# Patient Record
Sex: Female | Born: 1955
Health system: Southern US, Community
[De-identification: ages and names within clinical notes are randomized; demographics above are authoritative.]

## PROBLEM LIST (undated history)

## (undated) DIAGNOSIS — Z9889 Other specified postprocedural states: Secondary | ICD-10-CM

## (undated) DIAGNOSIS — J9610 Chronic respiratory failure, unspecified whether with hypoxia or hypercapnia: Secondary | ICD-10-CM

## (undated) DIAGNOSIS — I219 Acute myocardial infarction, unspecified: Secondary | ICD-10-CM

## (undated) DIAGNOSIS — N183 Chronic kidney disease, stage 3 unspecified: Secondary | ICD-10-CM

## (undated) DIAGNOSIS — I639 Cerebral infarction, unspecified: Secondary | ICD-10-CM

## (undated) DIAGNOSIS — E119 Type 2 diabetes mellitus without complications: Secondary | ICD-10-CM

## (undated) DIAGNOSIS — J9811 Atelectasis: Secondary | ICD-10-CM

## (undated) DIAGNOSIS — R1013 Epigastric pain: Secondary | ICD-10-CM

## (undated) DIAGNOSIS — M4800 Spinal stenosis, site unspecified: Secondary | ICD-10-CM

## (undated) DIAGNOSIS — J189 Pneumonia, unspecified organism: Secondary | ICD-10-CM

## (undated) DIAGNOSIS — E7800 Pure hypercholesterolemia, unspecified: Secondary | ICD-10-CM

## (undated) DIAGNOSIS — I10 Essential (primary) hypertension: Secondary | ICD-10-CM

## (undated) DIAGNOSIS — N179 Acute kidney failure, unspecified: Secondary | ICD-10-CM

## (undated) DIAGNOSIS — I509 Heart failure, unspecified: Secondary | ICD-10-CM

## (undated) DIAGNOSIS — R0600 Dyspnea, unspecified: Secondary | ICD-10-CM

## (undated) HISTORY — PX: BLADDER SURGERY: SHX569

## (undated) HISTORY — DX: Atelectasis: J98.11

## (undated) HISTORY — DX: Chronic respiratory failure, unspecified whether with hypoxia or hypercapnia: J96.10

## (undated) HISTORY — DX: Acute kidney failure, unspecified: N17.9

## (undated) HISTORY — DX: Chronic kidney disease, stage 3 unspecified: N18.30

## (undated) HISTORY — PX: CHOLECYSTECTOMY: SHX55

## (undated) HISTORY — DX: Other specified postprocedural states: Z98.890

## (undated) HISTORY — DX: Epigastric pain: R10.13

---
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
## 2015-10-01 ENCOUNTER — Emergency Department (HOSPITAL_COMMUNITY): Payer: Medicaid Other

## 2015-10-01 ENCOUNTER — Emergency Department (HOSPITAL_COMMUNITY)
Admission: EM | Admit: 2015-10-01 | Discharge: 2015-10-01 | Disposition: A | Discharge status: Home / Self Care | Payer: Medicaid Other | Attending: Emergency Medicine | Admitting: Emergency Medicine

## 2015-10-01 ENCOUNTER — Encounter (HOSPITAL_COMMUNITY): Payer: Self-pay

## 2015-10-01 DIAGNOSIS — E1165 Type 2 diabetes mellitus with hyperglycemia: Secondary | ICD-10-CM | POA: Insufficient documentation

## 2015-10-01 DIAGNOSIS — L0231 Cutaneous abscess of buttock: Secondary | ICD-10-CM | POA: Diagnosis not present

## 2015-10-01 DIAGNOSIS — R110 Nausea: Secondary | ICD-10-CM | POA: Insufficient documentation

## 2015-10-01 DIAGNOSIS — R0602 Shortness of breath: Secondary | ICD-10-CM

## 2015-10-01 DIAGNOSIS — M79602 Pain in left arm: Secondary | ICD-10-CM | POA: Insufficient documentation

## 2015-10-01 DIAGNOSIS — R079 Chest pain, unspecified: Secondary | ICD-10-CM | POA: Diagnosis not present

## 2015-10-01 DIAGNOSIS — Z79899 Other long term (current) drug therapy: Secondary | ICD-10-CM | POA: Diagnosis not present

## 2015-10-01 DIAGNOSIS — Z91040 Latex allergy status: Secondary | ICD-10-CM | POA: Insufficient documentation

## 2015-10-01 DIAGNOSIS — Z794 Long term (current) use of insulin: Secondary | ICD-10-CM | POA: Insufficient documentation

## 2015-10-01 DIAGNOSIS — I10 Essential (primary) hypertension: Secondary | ICD-10-CM | POA: Diagnosis not present

## 2015-10-01 DIAGNOSIS — L0291 Cutaneous abscess, unspecified: Secondary | ICD-10-CM

## 2015-10-01 HISTORY — DX: Essential (primary) hypertension: I10

## 2015-10-01 HISTORY — DX: Pure hypercholesterolemia, unspecified: E78.00

## 2015-10-01 HISTORY — DX: Type 2 diabetes mellitus without complications: E11.9

## 2015-10-01 LAB — I-STAT TROPONIN, ED
TROPONIN I, POC: 0 ng/mL (ref 0.00–0.08)
Troponin i, poc: 0 ng/mL (ref 0.00–0.08)

## 2015-10-01 LAB — BASIC METABOLIC PANEL
Anion gap: 8 (ref 5–15)
BUN: 24 mg/dL — AB (ref 6–20)
CHLORIDE: 97 mmol/L — AB (ref 101–111)
CO2: 26 mmol/L (ref 22–32)
Calcium: 9.7 mg/dL (ref 8.9–10.3)
Creatinine, Ser: 0.67 mg/dL (ref 0.44–1.00)
GFR calc Af Amer: >60 mL/min (ref >60)
GLUCOSE: 284 mg/dL — AB (ref 65–99)
POTASSIUM: 3.8 mmol/L (ref 3.5–5.1)
Sodium: 131 mmol/L — ABNORMAL LOW (ref 135–145)

## 2015-10-01 LAB — CBC WITH DIFFERENTIAL/PLATELET
Basophils Absolute: 0 10*3/uL (ref 0.0–0.1)
Basophils Relative: 0 %
EOS PCT: 1 %
Eosinophils Absolute: 0 10*3/uL (ref 0.0–0.7)
HEMATOCRIT: 38.6 % (ref 36.0–46.0)
Hemoglobin: 13.3 g/dL (ref 12.0–15.0)
LYMPHS PCT: 14 %
Lymphs Abs: 0.9 10*3/uL (ref 0.7–4.0)
MCH: 28.5 pg (ref 26.0–34.0)
MCHC: 34.5 g/dL (ref 30.0–36.0)
MCV: 82.7 fL (ref 78.0–100.0)
MONO ABS: 0.3 10*3/uL (ref 0.1–1.0)
MONOS PCT: 5 %
NEUTROS ABS: 5.3 10*3/uL (ref 1.7–7.7)
Neutrophils Relative %: 80 %
Platelets: 222 10*3/uL (ref 150–400)
RBC: 4.67 MIL/uL (ref 3.87–5.11)
RDW: 13 % (ref 11.5–15.5)
WBC: 6.6 10*3/uL (ref 4.0–10.5)

## 2015-10-01 LAB — CBG MONITORING, ED: Glucose-Capillary: 234 mg/dL — ABNORMAL HIGH (ref 65–99)

## 2015-10-01 LAB — BRAIN NATRIURETIC PEPTIDE: B NATRIURETIC PEPTIDE 5: 15.3 pg/mL (ref 0.0–100.0)

## 2015-10-01 MED ORDER — SULFAMETHOXAZOLE-TRIMETHOPRIM 800-160 MG PO TABS: 1.0000 | ORAL_TABLET | Freq: Two times a day (BID) | ORAL | Status: AC

## 2015-10-01 MED ORDER — ASPIRIN 81 MG PO CHEW
324.0000 mg | CHEWABLE_TABLET | Freq: Once | ORAL | Status: AC
  Administered 2015-10-01: 324 mg via ORAL
  Filled 2015-10-01: qty 4

## 2015-10-01 MED ORDER — INSULIN ASPART 100 UNIT/ML ~~LOC~~ SOLN
4.0000 [IU] | Freq: Once | SUBCUTANEOUS | Status: AC
  Administered 2015-10-01: 4 [IU] via SUBCUTANEOUS
  Filled 2015-10-01: qty 1

## 2015-10-01 MED ORDER — LIDOCAINE-EPINEPHRINE 2 %-1:100000 IJ SOLN
20.0000 mL | Freq: Once | INTRAMUSCULAR | Status: AC
  Administered 2015-10-01: 20 mL via INTRADERMAL
  Filled 2015-10-01: qty 1

## 2015-10-01 MED ORDER — OXYCODONE-ACETAMINOPHEN 5-325 MG PO TABS
1.0000 | ORAL_TABLET | Freq: Once | ORAL | Status: AC
  Administered 2015-10-01: 1 via ORAL
  Filled 2015-10-01: qty 1

## 2015-10-01 MED ORDER — IOPAMIDOL (ISOVUE-370) INJECTION 76%
100.0000 mL | Freq: Once | INTRAVENOUS | Status: AC | PRN
  Administered 2015-10-01: 100 mL via INTRAVENOUS

## 2015-10-01 NOTE — ED Provider Notes (Signed)
Care assumed from [facility], [place], at shift change. Pt here with multiple complaints, including: L arm pain x1 month, buttock abscess, and CP/SOB that began today. L arm has been xrayed and results are negative, seems muscular per Nicole's report. Buttock abscess drained In reference to the CP/SOB: Results so far show first trop neg, CBC w/diff WNL, BMP with gluc 284 (insulin ordered) but otherwise WNL, CXR with questionable RML atelectasis/scarring which recommends f/up repeat imaging-- pt without cough and with clear lungs so will not be proceeding with abx this finding. EKG unremarkable.  Per Nicole's reports, seems like the CP/SOB could be anxiety related. HEART score 3 so we are Awaiting delta trop and BNP as well as a CTA. Plan is to reassess after these results are back, and if negative then she can go home with [facility] f/up; if positive then treat accordingly.    Physical Exam  BP 118/63 mmHg  Pulse 86  Temp(Src) 99.3 F (37.4 C) (Oral)  Resp 14  SpO2 99%  Physical Exam Gen: afebrile, VSS, NAD HEENT: EOMI, MMM Resp: no resp distress, CTAB in all lung fields, no w/r/r, no hypoxia or increased WOB, speaking in full sentences, SpO2 99% on RA  CV: RRR, nl s1/s2, no m/r/g, distal pulses intact, no pedal edema  Abd: appearance normal, nondistended, normoactive BS throughout MsK: moving all extremities with ease Neuro: A&O x4  ED Course  Procedures Results for orders placed or performed during the hospital encounter of 10/01/15  CBC with Differential  Result Value Ref Range   WBC 6.6 4.0 - 10.5 K/uL   RBC 4.67 3.87 - 5.11 MIL/uL   Hemoglobin 13.3 12.0 - 15.0 g/dL   HCT 38.6 36.0 - 46.0 %   MCV 82.7 78.0 - 100.0 fL   MCH 28.5 26.0 - 34.0 pg   MCHC 34.5 30.0 - 36.0 g/dL   RDW 13.0 11.5 - 15.5 %   Platelets 222 150 - 400 K/uL   Neutrophils Relative % 80 %   Neutro Abs 5.3 1.7 - 7.7 K/uL   Lymphocytes Relative 14 %   Lymphs Abs 0.9 0.7 - 4.0 K/uL   Monocytes Relative 5 %    Monocytes Absolute 0.3 0.1 - 1.0 K/uL   Eosinophils Relative 1 %   Eosinophils Absolute 0.0 0.0 - 0.7 K/uL   Basophils Relative 0 %   Basophils Absolute 0.0 0.0 - 0.1 K/uL  Basic metabolic panel  Result Value Ref Range   Sodium 131 (L) 135 - 145 mmol/L   Potassium 3.8 3.5 - 5.1 mmol/L   Chloride 97 (L) 101 - 111 mmol/L   CO2 26 22 - 32 mmol/L   Glucose, Bld 284 (H) 65 - 99 mg/dL   BUN 24 (H) 6 - 20 mg/dL   Creatinine, Ser 0.67 0.44 - 1.00 mg/dL   Calcium 9.7 8.9 - 10.3 mg/dL   GFR calc non Af Amer >60 >60 mL/min   GFR calc Af Amer >60 >60 mL/min   Anion gap 8 5 - 15  Brain natriuretic peptide  Result Value Ref Range   B Natriuretic Peptide 15.3 0.0 - 100.0 pg/mL  I-stat troponin, ED  Result Value Ref Range   Troponin i, poc 0.00 0.00 - 0.08 ng/mL   Comment 3          I-Stat Troponin, ED (not at [facility])  Result Value Ref Range   Troponin i, poc 0.00 0.00 - 0.08 ng/mL   Comment 3  CBG monitoring, ED  Result Value Ref Range   Glucose-Capillary 234 (H) 65 - 99 mg/dL   Dg Chest 2 View  10/01/2015  CLINICAL DATA:  Shortness of breath. EXAM: CHEST  2 VIEW COMPARISON:  No recent prior. FINDINGS: Mediastinum and hilar structures normal. Mild right middle lobe subsegmental atelectasis and or scarring noted on lateral view. Follow-up PA lateral chest x-rays are suggested to demonstrate clearing and/or stability. No pleural effusion or pneumothorax. No acute bony abnormality. No pleural effusion or pneumothorax. No acute bony abnormality. Surgical clips right upper quadrant. IMPRESSION: Mild right middle lobe subsegmental atelectasis and/or scarring noted on lateral view. Follow-up PA and lateral chest x-rays suggested demonstrate clearing and/or stability. Electronically Signed   By: Marcello Moores  Register   On: 10/01/2015 11:57   Dg Wrist Complete Left  10/01/2015  CLINICAL DATA:  Left wrist pain for 1 month, no known injury, initial encounter EXAM: LEFT WRIST - COMPLETE 3+ VIEW COMPARISON:   None. FINDINGS: There is no evidence of fracture or dislocation. There is no evidence of arthropathy or other focal bone abnormality. Soft tissues are unremarkable. IMPRESSION: No acute abnormality noted. Electronically Signed   By: Inez Catalina M.D.   On: 10/01/2015 13:57   Ct Angio Chest Pe W/cm &/or Wo Cm  10/01/2015  CLINICAL DATA:  Shortness of breath, chest pain starting this morning EXAM: CT ANGIOGRAPHY CHEST WITH CONTRAST TECHNIQUE: Multidetector CT imaging of the chest was performed using the standard protocol during bolus administration of intravenous contrast. Multiplanar CT image reconstructions and MIPs were obtained to evaluate the vascular anatomy. CONTRAST:  100 cc Isovue COMPARISON:  Chest x-ray 6/29/ 17 FINDINGS: Cardiovascular: Mild atherosclerotic calcifications of thoracic aorta. Atherosclerotic calcifications of coronary arteries. Heart size within normal limits. No pericardial effusion. The study is of excellent technical quality. There is no evidence of aortic aneurysm or dissection. No pulmonary embolus is noted Mediastinum/Nodes: There is no mediastinal hematoma or adenopathy. No hilar adenopathy is noted. Lungs/Pleura: Images of the lung parenchyma shows no acute infiltrate or pulmonary edema. No pneumothorax. No focal consolidation. No pleural effusion. There is no bronchiectasis. Upper Abdomen: The visualized upper abdomen is unremarkable. Musculoskeletal: Sagittal images of the spine shows degenerative changes mid and lower thoracic spine with multilevel anterior spurring. No destructive bony lesions are noted. No rib fractures are noted. No axillary adenopathy. Review of the MIP images confirms the above findings. IMPRESSION: 1. No infiltrate or pulmonary edema. 2. No pulmonary embolus is noted.  No aortic aneurysm or dissection. 3. No mediastinal hematoma or adenopathy. 4. Mild atherosclerotic calcifications of thoracic aorta and coronary arteries. Electronically Signed   By:  Lahoma Crocker M.D.   On: 10/01/2015 16:56   Dg Humerus Left  10/01/2015  CLINICAL DATA:  Mid humeral pain, no known injury, initial encounter EXAM: LEFT HUMERUS - 2+ VIEW COMPARISON:  None. FINDINGS: Mild degenerative changes of the acromioclavicular joint are seen. No acute fracture or dislocation is noted. No soft tissue changes are seen. IMPRESSION: No acute abnormality noted. Electronically Signed   By: Inez Catalina M.D.   On: 10/01/2015 13:56   EKG Interpretation  Date/Time:  Thursday October 01 2015 11:12:33 EDT Ventricular Rate:  108 PR Interval:    QRS Duration: 92 QT Interval:  311 QTC Calculation: 417 R Axis:   69 Text Interpretation:  Sinus tachycardia Low voltage, precordial leads Borderline repolarization abnormality Baseline wander in lead(s) V2 No previous ECGs available Confirmed by NGUYEN, EMILY ([id number]) on 10/01/2015 2:40:21 PM  Meds ordered this encounter  Medications  . lidocaine-EPINEPHrine (XYLOCAINE W/EPI) 2 %-1:100000 (with pres) injection 20 mL    Sig:   . insulin aspart (novoLOG) injection 4 Units    Sig:   . oxyCODONE-acetaminophen (PERCOCET/ROXICET) 5-325 MG per tablet 1 tablet    Sig:   . aspirin chewable tablet 324 mg    Sig:   . sulfamethoxazole-trimethoprim (BACTRIM DS,SEPTRA DS) 800-160 MG tablet    Sig: Take 1 tablet by mouth 2 (two) times daily.    Dispense:  14 tablet    Refill:  0    Order Specific Question:  Supervising Provider    Answer:  MILLER, BRIAN [[id number]]  . iopamidol (ISOVUE-370) 76 % injection 100 mL    Sig:      MDM:   ICD-9-CM ICD-10-CM   1. Chest pain, unspecified chest pain type 786.50 R07.9   2. SOB (shortness of breath) 786.05 R06.02   3. Left arm pain 729.5 M79.602   4. Abscess 682.9 L02.91   5. Type 2 diabetes mellitus with hyperglycemia, with long-term current use of insulin (HCC) 250.00 E11.65    790.29 Z79.4    V58.67     5:37 PM  Second trop neg. Repeat CBG 234 after 4U novolog (improved from 284). BNP 15.3 therefore  WNL. CTA showing no acute abnormalities. Discussed staying hydrated, and taking insulin as directed, and use tylenol/motrin for pain, as well as abscess wound care. Abx rx printed by Elmyra Ricks earlier. Will d/c with previously discussed plan per Nicole's notes. F/up with [place] in 1wk. I explained the diagnosis and have given explicit precautions to return to the ER including for any other new or worsening symptoms. The patient understands and accepts the medical plan as it's been dictated and I have answered their questions. Discharge instructions concerning home care and prescriptions have been given. The patient is STABLE and is discharged to home in good condition.   BP 128/73 mmHg  Pulse 79  Temp(Src) 99.3 F (37.4 C) (Oral)  Resp 18  SpO2 99%   Ledger Heindl Camprubi-Soms, PA-C 10/01/15 1741  Harvel Quale, MD 10/03/15 [phone]

## 2015-10-01 NOTE — ED Provider Notes (Signed)
CSN: [id number]     Arrival date & time 10/01/15  1103 History   First MD Initiated Contact with Patient 10/01/15 1246     Chief Complaint  Patient presents with  . Shortness of Breath  . Arm Pain     (Consider location/radiation/quality/duration/timing/severity/associated sxs/prior Treatment) HPI   Patient is a 60 year old female with past medical history of hypertension and diabetes who presents to the ED with multiple complaints. Patient reports for the past month she has had constant dull aching pain to her left humerus and left wrist. She notes the pain is worse with movement or when laying on her left side. Denies any recent fall, trauma or injury. She notes she has been seen in the ED multiple times in [place] for similar symptoms. Denies fever, swelling, redness, numbness, tingling, weakness.   Patient also reports waking up this morning with onset of shortness of breath, sharp stabbing chest pain worse with deep breathing and nausea. Pt reports the SOB and CP started at 9am and lasted a few minutes and then improved spontaneously and has resolved since arrival to the ED. She also reports having chronic burning/numbness to bilateral feet for the past 4-6 months. Pt denies taking any meds PTA. Denies fever, HA, visual changes, lightheadedness, dizziness, cough, abdominal pain, vomiting, diarrhea, urinary sxs, leg swelling. Pt endorses having a MI appx. 3 years ago in [place]. She reports having a heart cath performed but denies having any stents placed.  Pt also reports having pain and swelling to her right buttocks near her rectum that started a few days ago and has worsened. She reports having a small amount of bleeding and drainage from the area. Pt reports the pain has worsened over the past few days.   Past Medical History  Diagnosis Date  . Diabetes mellitus without complication ([place])   . Hypertension   . Hypercholesteremia    Past Surgical History  Procedure Laterality  Date  . Cholecystectomy    . Cesarean section     No family history on file. Social History  Substance Use Topics  . Smoking status: Never Smoker   . Smokeless tobacco: None  . Alcohol Use: No   OB History    No data available     Review of Systems  Respiratory: Positive for shortness of breath.   Cardiovascular: Positive for chest pain.  Gastrointestinal: Positive for nausea.  Musculoskeletal: Positive for arthralgias (left arm).  Neurological: Positive for numbness (chronic BLE).  All other systems reviewed and are negative.     Allergies  Latex and Morphine and related  Home Medications   Prior to Admission medications   Medication Sig Start Date End Date Taking? Authorizing Provider  enalapril (VASOTEC) 10 MG tablet Take 10 mg by mouth daily.   Yes Historical Provider, MD  insulin aspart (NOVOLOG) 100 UNIT/ML injection Inject 10 Units into the skin 3 (three) times daily before meals.   Yes Historical Provider, MD  insulin glargine (LANTUS) 100 UNIT/ML injection Inject 25 Units into the skin at bedtime.   Yes Historical Provider, MD  pravastatin (PRAVACHOL) 40 MG tablet Take 40 mg by mouth daily.   Yes Historical Provider, MD   BP 127/69 mmHg  Pulse 103  Temp(Src) 98.2 F (36.8 C) (Oral)  Resp 19  SpO2 97% Physical Exam  Constitutional: She is oriented to person, place, and time. She appears well-developed and well-nourished. No distress.  HENT:  Head: Normocephalic and atraumatic.  Mouth/Throat: Oropharynx is clear and  moist. No oropharyngeal exudate.  Eyes: Conjunctivae and EOM are normal. Pupils are equal, round, and reactive to light. Right eye exhibits no discharge. Left eye exhibits no discharge. No scleral icterus.  Neck: Normal range of motion. Neck supple.  Cardiovascular: Normal rate, regular rhythm, normal heart sounds and intact distal pulses.   HR 96  Pulmonary/Chest: Effort normal and breath sounds normal. No respiratory distress. She has no  wheezes. She has no rales. She exhibits no tenderness.  Abdominal: Soft. Bowel sounds are normal. She exhibits no distension and no mass. There is no tenderness. There is no rebound and no guarding.  Genitourinary: Rectal exam shows external hemorrhoid.  2x1cm abscess noted to right medial buttocks inferior to rectum with induration and fluctuance. No drainage. No surrounding erythema or swelling.   Musculoskeletal: Normal range of motion. She exhibits no edema.  Full passive range of motion of left shoulder, elbow, forearm, wrist and hand. Decreased active range of motion of left upper extremity due to reported pain and left humerus. Equal grip strength bilaterally. 2+ radial pulses. Sensation grossly intact. Cap refill less than 2. No swelling, erythema, warmth, abrasion, contusion or laceration noted.  Full range of motion of bilateral lower extremities. 2+ DP pulses. Sensation grossly intact.  Lymphadenopathy:    She has no cervical adenopathy.  Neurological: She is alert and oriented to person, place, and time.  Skin: Skin is warm and dry. She is not diaphoretic.  Nursing note and vitals reviewed.   ED Course  .Marland KitchenIncision and Drainage Date/Time: 10/01/2015 3:35 PM Performed by: Nona Dell Authorized by: Nona Dell Consent: Verbal consent obtained. Risks and benefits: risks, benefits and alternatives were discussed Consent given by: patient Patient understanding: patient states understanding of the procedure being performed Patient identity confirmed: verbally with patient Type: abscess Location: right buttocks. Anesthesia: local infiltration Local anesthetic: lidocaine 2% with epinephrine Anesthetic total: 2 ml Scalpel size: 11 Needle gauge: 23. Incision type: single straight Incision depth: dermal Complexity: complex Drainage: purulent Drainage amount: moderate Wound treatment: wound left open Packing material: none Patient tolerance: Patient  tolerated the procedure well with no immediate complications   (including critical care time) Labs Review Labs Reviewed  BASIC METABOLIC PANEL - Abnormal; Notable for the following:    Sodium 131 (*)    Chloride 97 (*)    Glucose, Bld 284 (*)    BUN 24 (*)    All other components within normal limits  CBC WITH DIFFERENTIAL/PLATELET  BRAIN NATRIURETIC PEPTIDE  I-STAT TROPOININ, ED    Imaging Review Dg Chest 2 View  10/01/2015  CLINICAL DATA:  Shortness of breath. EXAM: CHEST  2 VIEW COMPARISON:  No recent prior. FINDINGS: Mediastinum and hilar structures normal. Mild right middle lobe subsegmental atelectasis and or scarring noted on lateral view. Follow-up PA lateral chest x-rays are suggested to demonstrate clearing and/or stability. No pleural effusion or pneumothorax. No acute bony abnormality. No pleural effusion or pneumothorax. No acute bony abnormality. Surgical clips right upper quadrant. IMPRESSION: Mild right middle lobe subsegmental atelectasis and/or scarring noted on lateral view. Follow-up PA and lateral chest x-rays suggested demonstrate clearing and/or stability. Electronically Signed   By: Marcello Moores  Register   On: 10/01/2015 11:57   Dg Wrist Complete Left  10/01/2015  CLINICAL DATA:  Left wrist pain for 1 month, no known injury, initial encounter EXAM: LEFT WRIST - COMPLETE 3+ VIEW COMPARISON:  None. FINDINGS: There is no evidence of fracture or dislocation. There is no evidence of arthropathy  or other focal bone abnormality. Soft tissues are unremarkable. IMPRESSION: No acute abnormality noted. Electronically Signed   By: Inez Catalina M.D.   On: 10/01/2015 13:57   Dg Humerus Left  10/01/2015  CLINICAL DATA:  Mid humeral pain, no known injury, initial encounter EXAM: LEFT HUMERUS - 2+ VIEW COMPARISON:  None. FINDINGS: Mild degenerative changes of the acromioclavicular joint are seen. No acute fracture or dislocation is noted. No soft tissue changes are seen. IMPRESSION: No  acute abnormality noted. Electronically Signed   By: Inez Catalina M.D.   On: 10/01/2015 13:56   I have personally reviewed and evaluated these images and lab results as part of my medical decision-making.   EKG Interpretation   Date/Time:  Thursday October 01 2015 11:12:33 EDT Ventricular Rate:  108 PR Interval:    QRS Duration: 92 QT Interval:  311 QTC Calculation: 417 R Axis:   69 Text Interpretation:  Sinus tachycardia Low voltage, precordial leads  Borderline repolarization abnormality Baseline wander in lead(s) V2 No  previous ECGs available Confirmed by NGUYEN, EMILY ([id number]) on 10/01/2015  2:40:21 PM      MDM   Final diagnoses:  None   Patient presents with multiple complaints including left arm pain for the past month, abscess, shortness of breath and chest pain. Patient endorses history of diabetes, hypertension, CAD. VSS. Exam revealed nonthrombosed external hemorrhoids, subcutaneous abscess to right buttocks without signs of cellulitis. Mild tenderness over her left biceps, left upper extremity otherwise neurovascularly intact. Lungs clear to auscultation bilaterally. Patient given pain meds and ASA. EKG revealed sinus tachycardia, heart rate 108. On my initial evaluation, patient's heart rate was 88-96.   Initial troponin negative. Glucose 284, pt given 4U novolog, will order follow up CBG. Remaining labs unremarkable. Chest x-ray showed mild RML subsegmental atelectasis, recommend follow up CXR. Left arm x-ray negative. Discussed patient with Dr. Alfonse Spruce who evaluated the patient in the ED. Patient endorses CAD but unable to review any past medical records due to patient being seen in [place]. Dr. Alfonse Spruce spoke with patient's daughter on the phone who reports patient has had shortness of breath over the past week but notes it has worsened over the past few days. HEART score 3. Plan to order filter troponin, BNP and CT chest angio for further evaluation. I&D performed without any  complications, will plan to start pt on Bactrim and discussed wound care with pt.  Hand-off to [employer], PA-C. Pending CT angio, delta trop and BNP. If pt's labs and imaging are negative, plan to d/c pt home with outpatient follow up, bactrim for abscess and follow up CXR in 1-2 weeks.     Chesley Noon [place], [place] 10/01/15 1622  Harvel Quale, MD 10/03/15 [phone]

## 2015-10-01 NOTE — ED Notes (Signed)
Pt presents with c/o left arm pain that has been going on "for a while" and she has been seen multiple times in the hospital for the same. Reports the pain in her arm is constant. Pt reports the pain is in the middle of her arm. Pt also c/o shortness of breath that started this morning. Pt also c/o right leg pain that is burning in nature that started approx one week ago, no injury. NAD at this time.

## 2015-10-01 NOTE — Progress Notes (Signed)
Patient noted to have [employer] without a pcp.  EDCM spoke to patient at bedside.  Patient reports her pcp is Dr. Geronimo Running in [place].  Patient is here visiting.

## 2015-10-01 NOTE — ED Notes (Signed)
Patient transported to X-ray 

## 2015-10-01 NOTE — Discharge Instructions (Signed)
Take your medications as prescribed. Keep wound clean using Dial antibacterial soap and water, pat dry. I recommend continuing to apply warm compresses to area for 15 minutes 3-4 times daily. Follow-up with the primary care office listed above within the next week for follow-up of your chest pain, shortness of breath and left arm pain. I recommended having a repeat chest x-ray performed within the next 1-2 weeks due to atelectasis seen in the right middle lobe during her ED visit today. Please return to the Emergency Department if symptoms worsen or new onset of fever, headache, lightheadedness, dizziness, visual changes, difficulty breathing, coughing up blood, chest pain, palpitations, abdominal pain, vomiting, numbness, tingling, weakness, syncope, seizure.   Abscess An abscess (boil or furuncle) is an infected area on or under the skin. This area is filled with yellowish-white fluid (pus) and other material (debris). HOME CARE   Only take medicines as told by your doctor.  If you were given antibiotic medicine, take it as directed. Finish the medicine even if you start to feel better.  If gauze is used, follow your doctor's directions for changing the gauze.  To avoid spreading the infection:  Keep your abscess covered with a bandage.  Wash your hands well.  Do not share personal care items, towels, or whirlpools with others.  Avoid skin contact with others.  Keep your skin and clothes clean around the abscess.  Keep all doctor visits as told. GET HELP RIGHT AWAY IF:   You have more pain, puffiness (swelling), or redness in the wound site.  You have more fluid or blood coming from the wound site.  You have muscle aches, chills, or you feel sick.  You have a fever. MAKE SURE YOU:   Understand these instructions.  Will watch your condition.  Will get help right away if you are not doing well or get worse.   This information is not intended to replace advice given to  you by your health care provider. Make sure you discuss any questions you have with your health care provider.   Document Released: 09/07/2007 Document Revised: 09/20/2011 Document Reviewed: 06/04/2011 Elsevier Interactive Patient Education 2016 [employer]  Nonspecific Chest Pain  Chest pain can be caused by many different conditions. There is always a chance that your pain could be related to something serious, such as a heart attack or a blood clot in your lungs. Chest pain can also be caused by conditions that are not life-threatening. If you have chest pain, it is very important to follow up with your health care provider. CAUSES  Chest pain can be caused by:  Heartburn.  Pneumonia or bronchitis.  Anxiety or stress.  Inflammation around your heart (pericarditis) or lung (pleuritis or pleurisy).  A blood clot in your lung.  A collapsed lung (pneumothorax). It can develop suddenly on its own (spontaneous pneumothorax) or from trauma to the chest.  Shingles infection (varicella-zoster virus).  Heart attack.  Damage to the bones, muscles, and cartilage that make up your chest wall. This can include:  Bruised bones due to injury.  Strained muscles or cartilage due to frequent or repeated coughing or overwork.  Fracture to one or more ribs.  Sore cartilage due to inflammation (costochondritis). RISK FACTORS  Risk factors for chest pain may include:  Activities that increase your risk for trauma or injury to your chest.  Respiratory infections or conditions that cause frequent coughing.  Medical conditions or overeating that can cause heartburn.  Heart disease or family  history of heart disease.  Conditions or health behaviors that increase your risk of developing a blood clot.  Having had chicken pox (varicella zoster). SIGNS AND SYMPTOMS Chest pain can feel like:  Burning or tingling on the surface of your chest or deep in your chest.  Crushing, pressure,  aching, or squeezing pain.  Dull or sharp pain that is worse when you move, cough, or take a deep breath.  Pain that is also felt in your back, neck, shoulder, or arm, or pain that spreads to any of these areas. Your chest pain may come and go, or it may stay constant. DIAGNOSIS Lab tests or other studies may be needed to find the cause of your pain. Your health care provider may have you take a test called an ambulatory ECG (electrocardiogram). An ECG records your heartbeat patterns at the time the test is performed. You may also have other tests, such as:  Transthoracic echocardiogram (TTE). During echocardiography, sound waves are used to create a picture of all of the heart structures and to look at how blood flows through your heart.  Transesophageal echocardiogram (TEE).This is a more advanced imaging test that obtains images from inside your body. It allows your health care provider to see your heart in finer detail.  Cardiac monitoring. This allows your health care provider to monitor your heart rate and rhythm in real time.  Holter monitor. This is a portable device that records your heartbeat and can help to diagnose abnormal heartbeats. It allows your health care provider to track your heart activity for several days, if needed.  Stress tests. These can be done through exercise or by taking medicine that makes your heart beat more quickly.  Blood tests.  Imaging tests. TREATMENT  Your treatment depends on what is causing your chest pain. Treatment may include:  Medicines. These may include:  Acid blockers for heartburn.  Anti-inflammatory medicine.  Pain medicine for inflammatory conditions.  Antibiotic medicine, if an infection is present.  Medicines to dissolve blood clots.  Medicines to treat coronary artery disease.  Supportive care for conditions that do not require medicines. This may include:  Resting.  Applying heat or cold packs to injured  areas.  Limiting activities until pain decreases. HOME CARE INSTRUCTIONS  If you were prescribed an antibiotic medicine, finish it all even if you start to feel better.  Avoid any activities that bring on chest pain.  Do not use any tobacco products, including cigarettes, chewing tobacco, or electronic cigarettes. If you need help quitting, ask your health care provider.  Do not drink alcohol.  Take medicines only as directed by your health care provider.  Keep all follow-up visits as directed by your health care provider. This is important. This includes any further testing if your chest pain does not go away.  If heartburn is the cause for your chest pain, you may be told to keep your head raised (elevated) while sleeping. This reduces the chance that acid will go from your stomach into your esophagus.  Make lifestyle changes as directed by your health care provider. These may include:  Getting regular exercise. Ask your health care provider to suggest some activities that are safe for you.  Eating a heart-healthy diet. A registered dietitian can help you to learn healthy eating options.  Maintaining a healthy weight.  Managing diabetes, if necessary.  Reducing stress. SEEK MEDICAL CARE IF:  Your chest pain does not go away after treatment.  You have a rash with  blisters on your chest.  You have a fever. SEEK IMMEDIATE MEDICAL CARE IF:   Your chest pain is worse.  You have an increasing cough, or you cough up blood.  You have severe abdominal pain.  You have severe weakness.  You faint.  You have chills.  You have sudden, unexplained chest discomfort.  You have sudden, unexplained discomfort in your arms, back, neck, or jaw.  You have shortness of breath at any time.  You suddenly start to sweat, or your skin gets clammy.  You feel nauseous or you vomit.  You suddenly feel light-headed or dizzy.  Your heart begins to beat quickly, or it feels like it  is skipping beats. These symptoms may represent a serious problem that is an emergency. Do not wait to see if the symptoms will go away. Get medical help right away. Call your local emergency services (911 in the [place] Do not drive yourself to the hospital.   This information is not intended to replace advice given to you by your health care provider. Make sure you discuss any questions you have with your health care provider.   Document Released: 12/29/2004 Document Revised: 04/11/2014 Document Reviewed: 10/25/2013 Elsevier Interactive Patient Education 2016 [employer].  Shortness of Breath Shortness of breath means you have trouble breathing. It could also mean that you have a medical problem. You should get immediate medical care for shortness of breath. CAUSES   Not enough oxygen in the air such as with high altitudes or a smoke-filled room.  Certain lung diseases, infections, or problems.  Heart disease or conditions, such as angina or heart failure.  Low red blood cells (anemia).  Poor physical fitness, which can cause shortness of breath when you exercise.  Chest or back injuries or stiffness.  Being overweight.  Smoking.  Anxiety, which can make you feel like you are not getting enough air. DIAGNOSIS  Serious medical problems can often be found during your physical exam. Tests may also be done to determine why you are having shortness of breath. Tests may include:  Chest X-rays.  Lung function tests.  Blood tests.  An electrocardiogram (ECG).  An ambulatory electrocardiogram. An ambulatory ECG records your heartbeat patterns over a 24-hour period.  Exercise testing.  A transthoracic echocardiogram (TTE). During echocardiography, sound waves are used to evaluate how blood flows through your heart.  A transesophageal echocardiogram (TEE).  Imaging scans. Your health care provider may not be able to find a cause for your shortness of breath after your exam.  In this case, it is important to have a follow-up exam with your health care provider as directed.  TREATMENT  Treatment for shortness of breath depends on the cause of your symptoms and can vary greatly. HOME CARE INSTRUCTIONS   Do not smoke. Smoking is a common cause of shortness of breath. If you smoke, ask for help to quit.  Avoid being around chemicals or things that may bother your breathing, such as paint fumes and dust.  Rest as needed. Slowly resume your usual activities.  If medicines were prescribed, take them as directed for the full length of time directed. This includes oxygen and any inhaled medicines.  Keep all follow-up appointments as directed by your health care provider. SEEK MEDICAL CARE IF:   Your condition does not improve in the time expected.  You have a hard time doing your normal activities even with rest.  You have any new symptoms. SEEK IMMEDIATE MEDICAL CARE IF:  Your shortness of breath gets worse.  You feel light-headed, faint, or develop a cough not controlled with medicines.  You start coughing up blood.  You have pain with breathing.  You have chest pain or pain in your arms, shoulders, or abdomen.  You have a fever.  You are unable to walk up stairs or exercise the way you normally do. MAKE SURE YOU:  Understand these instructions.  Will watch your condition.  Will get help right away if you are not doing well or get worse.   This information is not intended to replace advice given to you by your health care provider. Make sure you discuss any questions you have with your health care provider.   Document Released: 12/14/2000 Document Revised: 03/26/2013 Document Reviewed: 06/06/2011 Elsevier Interactive Patient Education [employer].

## 2015-10-28 ENCOUNTER — Emergency Department (HOSPITAL_COMMUNITY): Payer: Medicaid Other

## 2015-10-28 ENCOUNTER — Encounter (HOSPITAL_COMMUNITY): Payer: Self-pay | Admitting: Emergency Medicine

## 2015-10-28 ENCOUNTER — Emergency Department (HOSPITAL_COMMUNITY)
Admission: EM | Admit: 2015-10-28 | Discharge: 2015-10-28 | Disposition: A | Discharge status: Home / Self Care | Payer: Medicaid Other | Attending: Physician Assistant | Admitting: Physician Assistant

## 2015-10-28 DIAGNOSIS — Z794 Long term (current) use of insulin: Secondary | ICD-10-CM | POA: Diagnosis not present

## 2015-10-28 DIAGNOSIS — I10 Essential (primary) hypertension: Secondary | ICD-10-CM | POA: Diagnosis not present

## 2015-10-28 DIAGNOSIS — M62830 Muscle spasm of back: Secondary | ICD-10-CM | POA: Diagnosis not present

## 2015-10-28 DIAGNOSIS — E119 Type 2 diabetes mellitus without complications: Secondary | ICD-10-CM | POA: Insufficient documentation

## 2015-10-28 DIAGNOSIS — Z79899 Other long term (current) drug therapy: Secondary | ICD-10-CM | POA: Diagnosis not present

## 2015-10-28 DIAGNOSIS — Z791 Long term (current) use of non-steroidal anti-inflammatories (NSAID): Secondary | ICD-10-CM | POA: Diagnosis not present

## 2015-10-28 DIAGNOSIS — M542 Cervicalgia: Secondary | ICD-10-CM | POA: Diagnosis present

## 2015-10-28 LAB — CBC
HCT: 41.9 % (ref 36.0–46.0)
Hemoglobin: 14.4 g/dL (ref 12.0–15.0)
MCH: 28.7 pg (ref 26.0–34.0)
MCHC: 34.4 g/dL (ref 30.0–36.0)
MCV: 83.5 fL (ref 78.0–100.0)
PLATELETS: 254 10*3/uL (ref 150–400)
RBC: 5.02 MIL/uL (ref 3.87–5.11)
RDW: 13.6 % (ref 11.5–15.5)
WBC: 5.5 10*3/uL (ref 4.0–10.5)

## 2015-10-28 LAB — URINALYSIS, ROUTINE W REFLEX MICROSCOPIC
BILIRUBIN URINE: NEGATIVE
Glucose, UA: >1000 mg/dL — AB
HGB URINE DIPSTICK: NEGATIVE
Ketones, ur: NEGATIVE mg/dL
NITRITE: NEGATIVE
PROTEIN: 100 mg/dL — AB
Specific Gravity, Urine: 1.035 — ABNORMAL HIGH (ref 1.005–1.030)
pH: 5.5 (ref 5.0–8.0)

## 2015-10-28 LAB — URINE MICROSCOPIC-ADD ON: RBC / HPF: NONE SEEN RBC/hpf (ref 0–5)

## 2015-10-28 LAB — BASIC METABOLIC PANEL
ANION GAP: 10 (ref 5–15)
BUN: 25 mg/dL — ABNORMAL HIGH (ref 6–20)
CALCIUM: 10.1 mg/dL (ref 8.9–10.3)
CO2: 27 mmol/L (ref 22–32)
CREATININE: 0.8 mg/dL (ref 0.44–1.00)
Chloride: 95 mmol/L — ABNORMAL LOW (ref 101–111)
Glucose, Bld: 344 mg/dL — ABNORMAL HIGH (ref 65–99)
Potassium: 4.4 mmol/L (ref 3.5–5.1)
SODIUM: 132 mmol/L — AB (ref 135–145)

## 2015-10-28 LAB — CBG MONITORING, ED: GLUCOSE-CAPILLARY: 364 mg/dL — AB (ref 65–99)

## 2015-10-28 MED ORDER — CYCLOBENZAPRINE HCL 10 MG PO TABS: 10.0000 mg | ORAL_TABLET | Freq: Two times a day (BID) | ORAL | 0 refills | Status: DC | PRN

## 2015-10-28 MED ORDER — OXYCODONE-ACETAMINOPHEN 5-325 MG PO TABS: 1.0000 | ORAL_TABLET | Freq: Four times a day (QID) | ORAL | 0 refills | Status: DC | PRN

## 2015-10-28 MED ORDER — DIAZEPAM 2 MG PO TABS
2.0000 mg | ORAL_TABLET | Freq: Once | ORAL | Status: AC
  Administered 2015-10-28: 2 mg via ORAL
  Filled 2015-10-28: qty 1

## 2015-10-28 MED ORDER — OXYCODONE-ACETAMINOPHEN 5-325 MG PO TABS
1.0000 | ORAL_TABLET | Freq: Once | ORAL | Status: AC
  Administered 2015-10-28: 1 via ORAL
  Filled 2015-10-28: qty 1

## 2015-10-28 NOTE — ED Notes (Signed)
Pt given discharge instructions, verbalized understanding of need to follow up, reasons to return to the ED and medications to take at home. Pt VSS, pain controlled 5/10 per pt. IV removed intact, site clean and dry. Pt denied further questions or concerns. Pt able to ambulate to exit without difficulty.

## 2015-10-28 NOTE — ED Provider Notes (Signed)
[facility] DEPT Provider Note   CSN: [id number] Arrival date & time: 10/28/15  1333  First Provider Contact:  First MD Initiated Contact with Patient 10/28/15 1435        History   Chief Complaint Chief Complaint  Patient presents with  . Neck Pain  . Extremity Weakness    HPI Kara Hanson is a 60 y.o. female.  HPI   Patient is 60 year old female who normally resides in [place] resenting here to the emergency department with pain. Patient has been living with her daughter for last month and plans to live here for another month or 2. Therefore she has no primary care physician in the area. Patient reports that she's having left arm pain for the last several months. Today she also has some pain in her left trapezius. Patient feels like she is unable to move her arm as well due to pain. Patient's sensation is normal. Patient had no injury.  Past Medical History:  Diagnosis Date  . Diabetes mellitus without complication ([place])   . Hypercholesteremia   . Hypertension     There are no active problems to display for this patient.   Past Surgical History:  Procedure Laterality Date  . CESAREAN SECTION    . CHOLECYSTECTOMY      OB History    No data available       Home Medications    Prior to Admission medications   Medication Sig Start Date End Date Taking? Authorizing Provider  enalapril (VASOTEC) 10 MG tablet Take 10 mg by mouth daily.   Yes Historical Provider, MD  ibuprofen (ADVIL,MOTRIN) 200 MG tablet Take 600 mg by mouth every 6 (six) hours as needed for headache, mild pain or moderate pain.   Yes Historical Provider, MD  insulin aspart (NOVOLOG) 100 UNIT/ML injection Inject 10 Units into the skin 3 (three) times daily before meals.   Yes Historical Provider, MD  insulin glargine (LANTUS) 100 UNIT/ML injection Inject 25 Units into the skin at bedtime.   Yes Historical Provider, MD  naproxen sodium (ANAPROX) 220 MG tablet Take 440-660 mg by mouth every 12  (twelve) hours as needed (pain).   Yes Historical Provider, MD  pravastatin (PRAVACHOL) 40 MG tablet Take 40 mg by mouth daily.   Yes Historical Provider, MD    Family History History reviewed. No pertinent family history.  Social History Social History  Substance Use Topics  . Smoking status: Never Smoker  . Smokeless tobacco: Never Used  . Alcohol use No     Allergies   Latex and Morphine and related   Review of Systems Review of Systems  Constitutional: Negative for activity change.  Respiratory: Negative for shortness of breath.   Cardiovascular: Negative for chest pain.  Gastrointestinal: Negative for abdominal pain.  Musculoskeletal: Positive for back pain and myalgias.  Neurological: Negative for headaches.     Physical Exam Updated Vital Signs BP 152/78 (BP Location: Left Arm)   Pulse 80   Temp 97.9 F (36.6 C) (Oral)   Resp 18   SpO2 98%   Physical Exam  Constitutional: She appears well-developed and well-nourished. No distress.  HENT:  Head: Normocephalic and atraumatic.  Eyes: Conjunctivae are normal.  Neck: Neck supple.  Cardiovascular: Normal rate and regular rhythm.   No murmur heard. Pulmonary/Chest: Effort normal and breath sounds normal. No respiratory distress.  Abdominal: Soft. There is no tenderness.  Musculoskeletal: She exhibits tenderness. She exhibits no edema or deformity.  Pain to palpation of L  arm. Pain to palpation in L trapezius muscle3. No pain over C spinous processes.  Full ROM.  STrength limited by pain,   Neurological: She is alert.  Skin: Skin is warm and dry.  Psychiatric: She has a normal mood and affect.  Nursing note and vitals reviewed.    ED Treatments / Results  Labs (all labs ordered are listed, but only abnormal results are displayed) Labs Reviewed  BASIC METABOLIC PANEL - Abnormal; Notable for the following:       Result Value   Sodium 132 (*)    Chloride 95 (*)    Glucose, Bld 344 (*)    BUN 25 (*)     All other components within normal limits  URINALYSIS, ROUTINE W REFLEX MICROSCOPIC (NOT AT [facility]) - Abnormal; Notable for the following:    APPearance CLOUDY (*)    Specific Gravity, Urine 1.035 (*)    Glucose, UA >1000 (*)    Protein, ur 100 (*)    Leukocytes, UA SMALL (*)    All other components within normal limits  URINE MICROSCOPIC-ADD ON - Abnormal; Notable for the following:    Squamous Epithelial / LPF 6-30 (*)    Bacteria, UA FEW (*)    All other components within normal limits  CBG MONITORING, ED - Abnormal; Notable for the following:    Glucose-Capillary 364 (*)    All other components within normal limits  CBC    EKG  EKG Interpretation  Date/Time:  Wednesday October 28 2015 14:24:33 EDT Ventricular Rate:  98 PR Interval:    QRS Duration: 82 QT Interval:  307 QTC Calculation: 392 R Axis:   45 Text Interpretation:  Sinus rhythm Low voltage, precordial leads Nonspecific T abnormalities, lateral leads Baseline wander in lead(s) V2 No significant change since last tracing Confirmed by Gerald Leitz ([id number]) on 10/28/2015 2:40:55 PM       Radiology Ct Cervical Spine Wo Contrast  Result Date: 10/28/2015 CLINICAL DATA:  Neck pain and left arm pain.  No known injury. EXAM: CT CERVICAL SPINE WITHOUT CONTRAST TECHNIQUE: Multidetector CT imaging of the cervical spine was performed without intravenous contrast. Multiplanar CT image reconstructions were also generated. COMPARISON:  None. FINDINGS: There is no fracture or subluxation or prevertebral soft tissue swelling. No facet arthritis in the cervical spine. There is a small central disc bulge at C3-4. C4-5: Disc space narrowing with a small broad-based disc bulge with accompanying osteophytes with no neural impingement. C5-6: Disc space narrowing with broad-based disc bulge with asymmetric osteophytes to the right without neural impingement. Slight calcification of the posterior longitudinal ligament to the right of midline  behind C6. Slight narrowing of the left neural foramen due to uncinate spurs. C6-7: Disc space narrowing with a small broad-based disc bulge asymmetric to the right without neural impingement. Next spurs to the left without foraminal stenosis. C7-T1:  Chronic disc space narrowing.  Otherwise normal. The paraspinal soft tissues are normal. IMPRESSION: No acute abnormality. Multilevel chronic degenerative disc disease. Slight left foraminal narrowing at C5-6 due to uncinate spurs. Electronically Signed   By: Lorriane Shire M.D.   On: 10/28/2015 15:58   Procedures Procedures (including critical care time)  Medications Ordered in ED Medications  oxyCODONE-acetaminophen (PERCOCET/ROXICET) 5-325 MG per tablet 1 tablet (1 tablet Oral Given 10/28/15 1600)  diazepam (VALIUM) tablet 2 mg (2 mg Oral Given 10/28/15 1600)     Initial Impression / Assessment and Plan / ED Course  I have reviewed the triage vital signs  and the nursing notes.  Pertinent labs & imaging results that were available during my care of the patient were reviewed by me and considered in my medical decision making (see chart for details).  Clinical Course    Patient a very pleasant 60 year old female presenting with chronic left arm pain. Patient reports she's had this pain for several months to year. She says that the trapezius pani is new, and feels weaker.  Will get CT of neck.  No hisotyr of injury.  No pain to spine, only in muscle of trap and bicep.  Will treat pain. Mild weakness on full shoulder raise. ? Secondary to pain  5:55 PM Strength normalized with treatmetn of pain. CT neg. Will have her call for PCP vs call her PCP in [place]. Will give short rx for pain managemetn.   Final Clinical Impressions(s) / ED Diagnoses   Final diagnoses:  None    New Prescriptions New Prescriptions   No medications on file     Jennavie Martinek Julio Alm, MD 10/28/15 1755

## 2015-10-28 NOTE — ED Triage Notes (Addendum)
Pt c/o left neck pain radiating down left arm x3 days. Pt also reports left arm weakness since 0900 today. Pt reports headache this morning. Denies chest pain.

## 2015-10-28 NOTE — Discharge Instructions (Signed)
Call your primary care physician for more pain management.  Return with any concerns.

## 2015-10-28 NOTE — Progress Notes (Signed)
Patient listed as having [employer] insurance without a pcp.  EDCM spoke to patient at bedside. Patient reports she has a pcp in [place].  Patient is in [place] visiting.  No further EDCM needs at time.

## 2015-11-12 ENCOUNTER — Encounter (HOSPITAL_COMMUNITY): Payer: Self-pay | Admitting: Emergency Medicine

## 2015-11-12 DIAGNOSIS — I10 Essential (primary) hypertension: Secondary | ICD-10-CM | POA: Insufficient documentation

## 2015-11-12 DIAGNOSIS — Z794 Long term (current) use of insulin: Secondary | ICD-10-CM | POA: Diagnosis not present

## 2015-11-12 DIAGNOSIS — M4802 Spinal stenosis, cervical region: Secondary | ICD-10-CM | POA: Insufficient documentation

## 2015-11-12 DIAGNOSIS — E1165 Type 2 diabetes mellitus with hyperglycemia: Secondary | ICD-10-CM | POA: Diagnosis not present

## 2015-11-12 NOTE — ED Triage Notes (Signed)
Pt. reports painful lump at posterior lower neck pain radiating to left shoulder and left arm onset last week , pain increases with movement /stiffness.

## 2015-11-13 ENCOUNTER — Emergency Department (HOSPITAL_COMMUNITY)
Admission: EM | Admit: 2015-11-13 | Discharge: 2015-11-13 | Disposition: A | Discharge status: Home / Self Care | Payer: Medicaid Other | Attending: Emergency Medicine | Admitting: Emergency Medicine

## 2015-11-13 ENCOUNTER — Emergency Department (HOSPITAL_COMMUNITY): Payer: Medicaid Other

## 2015-11-13 ENCOUNTER — Emergency Department (HOSPITAL_BASED_OUTPATIENT_CLINIC_OR_DEPARTMENT_OTHER): Admit: 2015-11-13 | Discharge: 2015-11-13 | Disposition: A | Discharge status: Home / Self Care | Payer: Medicaid Other

## 2015-11-13 DIAGNOSIS — R739 Hyperglycemia, unspecified: Secondary | ICD-10-CM

## 2015-11-13 DIAGNOSIS — M79609 Pain in unspecified limb: Secondary | ICD-10-CM

## 2015-11-13 DIAGNOSIS — M4802 Spinal stenosis, cervical region: Secondary | ICD-10-CM

## 2015-11-13 LAB — CBC WITH DIFFERENTIAL/PLATELET
Basophils Absolute: 0 10*3/uL (ref 0.0–0.1)
Basophils Relative: 0 %
EOS PCT: 2 %
Eosinophils Absolute: 0.1 10*3/uL (ref 0.0–0.7)
HCT: 39.4 % (ref 36.0–46.0)
Hemoglobin: 13.1 g/dL (ref 12.0–15.0)
LYMPHS ABS: 1.9 10*3/uL (ref 0.7–4.0)
LYMPHS PCT: 30 %
MCH: 28.4 pg (ref 26.0–34.0)
MCHC: 33.2 g/dL (ref 30.0–36.0)
MCV: 85.5 fL (ref 78.0–100.0)
MONO ABS: 0.4 10*3/uL (ref 0.1–1.0)
Monocytes Relative: 7 %
Neutro Abs: 3.9 10*3/uL (ref 1.7–7.7)
Neutrophils Relative %: 61 %
PLATELETS: 271 10*3/uL (ref 150–400)
RBC: 4.61 MIL/uL (ref 3.87–5.11)
RDW: 13 % (ref 11.5–15.5)
WBC: 6.3 10*3/uL (ref 4.0–10.5)

## 2015-11-13 LAB — COMPREHENSIVE METABOLIC PANEL
ALT: 22 U/L (ref 14–54)
AST: 17 U/L (ref 15–41)
Albumin: 3.7 g/dL (ref 3.5–5.0)
Alkaline Phosphatase: 73 U/L (ref 38–126)
Anion gap: 11 (ref 5–15)
BUN: 26 mg/dL — ABNORMAL HIGH (ref 6–20)
CHLORIDE: 97 mmol/L — AB (ref 101–111)
CO2: 25 mmol/L (ref 22–32)
Calcium: 9.8 mg/dL (ref 8.9–10.3)
Creatinine, Ser: 0.86 mg/dL (ref 0.44–1.00)
Glucose, Bld: 326 mg/dL — ABNORMAL HIGH (ref 65–99)
POTASSIUM: 3.8 mmol/L (ref 3.5–5.1)
Sodium: 133 mmol/L — ABNORMAL LOW (ref 135–145)
Total Bilirubin: 0.5 mg/dL (ref 0.3–1.2)
Total Protein: 6.6 g/dL (ref 6.5–8.1)

## 2015-11-13 MED ORDER — SODIUM CHLORIDE 0.9 % IV BOLUS (SEPSIS)
1000.0000 mL | Freq: Once | INTRAVENOUS | Status: AC
  Administered 2015-11-13: 1000 mL via INTRAVENOUS

## 2015-11-13 MED ORDER — HYDROMORPHONE HCL 1 MG/ML IJ SOLN
1.0000 mg | Freq: Once | INTRAMUSCULAR | Status: AC
  Administered 2015-11-13: 1 mg via INTRAVENOUS
  Filled 2015-11-13: qty 1

## 2015-11-13 MED ORDER — OXYCODONE-ACETAMINOPHEN 5-325 MG PO TABS: 1.0000 | ORAL_TABLET | Freq: Four times a day (QID) | ORAL | 0 refills | Status: DC | PRN

## 2015-11-13 NOTE — ED Provider Notes (Signed)
[facility] DEPT Provider Note   CSN: [id number] Arrival date & time: 11/12/15  2310  First Provider Contact:  None       History   Chief Complaint Chief Complaint  Patient presents with  . Mass    HPI Kara Hanson is a 60 y.o. female.  Patient presents with complaint of neck and left arm pain. She reports months of left arm pain that has gone undiagnosed. She reports the pain started to move into her posterior shoulder and neck in the last 1-2 weeks and has been seen in the ED twice in the last one month, also without diagnosis. She has not been able to follow up outpatient yet. Tonight she returns to the ED with worsening arm and neck pain, now with swelling in the cervical spine and swelling through the left UE, including her hand. For the last 2 weeks she has had progressive difficulty lifting her upper arm, she states secondary to weakness, not pain.    The history is provided by the patient and a relative. No language interpreter was used.    Past Medical History:  Diagnosis Date  . Diabetes mellitus without complication ([place])   . Hypercholesteremia   . Hypertension     There are no active problems to display for this patient.   Past Surgical History:  Procedure Laterality Date  . BLADDER SURGERY    . CESAREAN SECTION    . CHOLECYSTECTOMY      OB History    No data available       Home Medications    Prior to Admission medications   Medication Sig Start Date End Date Taking? Authorizing Provider  cyclobenzaprine (FLEXERIL) 10 MG tablet Take 1 tablet (10 mg total) by mouth 2 (two) times daily as needed for muscle spasms. 10/28/15   Courteney Lyn Mackuen, MD  enalapril (VASOTEC) 10 MG tablet Take 10 mg by mouth daily.    Historical Provider, MD  ibuprofen (ADVIL,MOTRIN) 200 MG tablet Take 600 mg by mouth every 6 (six) hours as needed for headache, mild pain or moderate pain.    Historical Provider, MD  insulin aspart (NOVOLOG) 100 UNIT/ML injection  Inject 10 Units into the skin 3 (three) times daily before meals.    Historical Provider, MD  insulin glargine (LANTUS) 100 UNIT/ML injection Inject 25 Units into the skin at bedtime.    Historical Provider, MD  naproxen sodium (ANAPROX) 220 MG tablet Take 440-660 mg by mouth every 12 (twelve) hours as needed (pain).    Historical Provider, MD  oxyCODONE-acetaminophen (PERCOCET/ROXICET) 5-325 MG tablet Take 1 tablet by mouth every 6 (six) hours as needed for severe pain. 10/28/15   Courteney Lyn Mackuen, MD  pravastatin (PRAVACHOL) 40 MG tablet Take 40 mg by mouth daily.    Historical Provider, MD    Family History No family history on file.  Social History Social History  Substance Use Topics  . Smoking status: Never Smoker  . Smokeless tobacco: Never Used  . Alcohol use No     Allergies   Latex and Morphine and related   Review of Systems Review of Systems  Constitutional: Negative for chills and fever.  Respiratory: Negative.  Negative for shortness of breath.   Cardiovascular: Negative.  Negative for chest pain.  Gastrointestinal: Negative.  Negative for abdominal pain, nausea and vomiting.  Musculoskeletal: Positive for back pain and myalgias.       See HPI.  Skin: Negative.  Negative for color change.  Neurological: Positive  for weakness. Negative for numbness.     Physical Exam Updated Vital Signs BP 177/82 (BP Location: Right Arm)   Pulse 96   Temp 98 F (36.7 C) (Oral)   Resp 20   Ht 5' (1.524 m)   Wt 72.1 kg   SpO2 98%   BMI 31.05 kg/m   Physical Exam  Constitutional: She appears well-developed and well-nourished.  HENT:  Head: Normocephalic and atraumatic.  Eyes: Pupils are equal, round, and reactive to light.  Neck: Neck supple.  Cardiovascular: Normal rate.   No murmur heard. Pulmonary/Chest: Effort normal. She has no wheezes. She has no rales.  Abdominal: Soft. There is no tenderness.  Musculoskeletal:       Arms: Left upper extremity limited  on ROM of shoulder. Full movement at elbow and distally with symmetric grip strength on right and left. Left UE mildly swollen. There is midline cervical tenderness with swelling of lower cervical spine.   Neurological: She is alert.  Decreased sensation to light touch of entire left upper extremity.   Skin: Skin is warm and dry.     ED Treatments / Results  Labs (all labs ordered are listed, but only abnormal results are displayed) Labs Reviewed  CBC WITH DIFFERENTIAL/PLATELET  COMPREHENSIVE METABOLIC PANEL    EKG  EKG Interpretation  Date/Time:  Friday November 13 2015 04:56:05 EDT Ventricular Rate:  86 PR Interval:    QRS Duration: 95 QT Interval:  388 QTC Calculation: 465 R Axis:   71 Text Interpretation:  Sinus rhythm No significant change since last tracing Confirmed by Christy Gentles  MD, DONALD ([id number]) on 11/13/2015 5:03:03 AM       Radiology No results found.  Procedures Procedures (including critical care time)  Medications Ordered in ED Medications  HYDROmorphone (DILAUDID) injection 1 mg (not administered)     Initial Impression / Assessment and Plan / ED Course  I have reviewed the triage vital signs and the nursing notes.  Pertinent labs & imaging results that were available during my care of the patient were reviewed by me and considered in my medical decision making (see chart for details).  Clinical Course    Patient returns to ED with progressively worsening pain in left UE and cervical neck, now with new onset swelling of neck and arm. CT cervical spine performed on previous ED visit - negative acute. MR ordered for further evaluation. Patient signed out to oncoming treatment team pending MR read.   Final Clinical Impressions(s) / ED Diagnoses   Final diagnoses:  None   Cervical radiculopathy  New Prescriptions New Prescriptions   No medications on file     Charlann Lange, Hershal Coria 11/13/15 [id number]    Ripley Fraise, MD 11/13/15 0700

## 2015-11-13 NOTE — ED Provider Notes (Signed)
Subjective: Patient visiting form [place].+ DM. July negative CT C spine negative. In today for worsening Swelling over the C7 process. MRI pending.  patient has new swelling in the left hand that began this morning.   Objective: Well appreaing, NAD CV: RRR, No M/R/G, Peripheral pulses intact. No peripheral edema. Lungs: CTAB Abd: Soft, Non tender, non distended MSK: left upper extremity is weak and tremulous, left hand is markedly more swollen than the right with decreased grip strength. Neuro: tremulous   A/P:Left arm pain- chronic neurogenic, msk vs. Vascular? MRI pending. I have added a LUE DVT study.    Patient MRI and DVT study negative. Does have significant degenerative disease and spinal stenosis. Patient has been given an appointment at the [facility] this coming Monday for treatment of her diabetes, help with specialists access if needed. Patient will be given medications to treat pain. She appears safe for discharge at this time.    Margarita Mail, PA-C 11/13/15 1641    Ripley Fraise, MD 11/14/15 [phone]

## 2015-11-13 NOTE — Discharge Planning (Signed)
Kara J. Wood, RN, BSN, NCM [phone] ED CM consulted regarding PCP establishment and insurance enrollment. Pt presented to [facility] ED today with arm pain and uncontrolled DM. EDCM met with pt and family at bedside; pt confirms not having access to f/u care with PCP or insurance coverage (her Medicaid is from [place]). Discussed with patient importance and benefits of establishing PCP, and not utilizing the ED for primary care needs. Pt verbalized understanding and is in agreement. Discussed other options, provided list of local  affordable PCPs.  Pt voiced interest in [facility].  [facility] Brochure given with address, phone number, and the services highlighted. Informed pt of appointment date (8/14) and time (1000).  No further case management needs communicated at this time.  

## 2015-11-13 NOTE — ED Notes (Signed)
Sats dropping into low 80's, pulse Ox checked Pt. sts "it feels hard to breathe" MD notified and 2L O2 applied

## 2015-11-13 NOTE — Progress Notes (Signed)
Preliminary results by tech - Left Upper Ext Venous Completed. Negative for deep and superficial vein thrombosis. Oda Cogan, BS, RDMS, RVT

## 2015-11-13 NOTE — ED Notes (Signed)
D/C papers reviewed with pt. And family member and they deny questions.

## 2015-11-13 NOTE — ED Notes (Signed)
Pt. Transported to MRI at this time.  

## 2015-11-16 ENCOUNTER — Inpatient Hospital Stay: Payer: BLUE CROSS/BLUE SHIELD

## 2015-12-05 ENCOUNTER — Emergency Department (HOSPITAL_COMMUNITY)
Admission: EM | Admit: 2015-12-05 | Discharge: 2015-12-06 | Disposition: A | Discharge status: Home / Self Care | Payer: Medicaid Other | Attending: Emergency Medicine | Admitting: Emergency Medicine

## 2015-12-05 ENCOUNTER — Encounter (HOSPITAL_COMMUNITY): Payer: Self-pay

## 2015-12-05 DIAGNOSIS — Z794 Long term (current) use of insulin: Secondary | ICD-10-CM | POA: Diagnosis not present

## 2015-12-05 DIAGNOSIS — R739 Hyperglycemia, unspecified: Secondary | ICD-10-CM

## 2015-12-05 DIAGNOSIS — M5410 Radiculopathy, site unspecified: Secondary | ICD-10-CM

## 2015-12-05 DIAGNOSIS — Z91040 Latex allergy status: Secondary | ICD-10-CM | POA: Diagnosis not present

## 2015-12-05 DIAGNOSIS — M5412 Radiculopathy, cervical region: Secondary | ICD-10-CM | POA: Diagnosis not present

## 2015-12-05 DIAGNOSIS — I10 Essential (primary) hypertension: Secondary | ICD-10-CM | POA: Insufficient documentation

## 2015-12-05 DIAGNOSIS — E1165 Type 2 diabetes mellitus with hyperglycemia: Secondary | ICD-10-CM | POA: Insufficient documentation

## 2015-12-05 DIAGNOSIS — Z9114 Patient's other noncompliance with medication regimen: Secondary | ICD-10-CM | POA: Insufficient documentation

## 2015-12-05 DIAGNOSIS — M542 Cervicalgia: Secondary | ICD-10-CM | POA: Diagnosis present

## 2015-12-05 MED ORDER — METHOCARBAMOL 500 MG PO TABS
500.0000 mg | ORAL_TABLET | Freq: Once | ORAL | Status: AC
  Administered 2015-12-05: 500 mg via ORAL
  Filled 2015-12-05: qty 1

## 2015-12-05 MED ORDER — ACETAMINOPHEN 500 MG PO TABS
1000.0000 mg | ORAL_TABLET | Freq: Once | ORAL | Status: AC
  Administered 2015-12-05: 1000 mg via ORAL
  Filled 2015-12-05: qty 2

## 2015-12-05 NOTE — ED Notes (Signed)
Pt reports that she needs a referral to clinic, she is out of her BP medications and needs refills. Just moved to area and hasn't been able to get an appointment with a new PCP.

## 2015-12-05 NOTE — ED Triage Notes (Signed)
Pt states that she has neck pain that started about one month ago and pain is shooting down the L arm. Pt has a hx of spinal stenosis, states that she is sleeping sitting up in a chair for the pain. Grips equal and neuro intact.

## 2015-12-05 NOTE — ED Provider Notes (Signed)
[facility] DEPT Provider Note   CSN: [id number] Arrival date & time: 12/05/15  2046     History   Chief Complaint Chief Complaint  Patient presents with  . Neck Pain  . Arm Pain    HPI Mallissa Anez is a 60 y.o. female.  The history is provided by the patient.  Neck Pain   This is a new problem. Episode onset: 1 month. The problem occurs constantly. Associated with: left arm pain. There has been no fever. The pain is present in the generalized neck. The quality of the pain is described as shooting (aching). The pain radiates to the left shoulder and left arm. The pain is severe. The symptoms are aggravated by position. Associated symptoms include numbness. Pertinent negatives include no photophobia, no chest pain, no headaches, no bowel incontinence, no bladder incontinence, no leg pain and no paresis. Treatments tried: Percocets, Tylenol, ibuprofen. The treatment provided mild relief.   Patient seen last month with MRI obtained revealing cervical stenosis. Patient given information to establish care with primary care provider however has been unable to do so.  Patient also reports running out of all of her medicine including her insulin last week.   Past Medical History:  Diagnosis Date  . Diabetes mellitus without complication ([place])   . Hypercholesteremia   . Hypertension     There are no active problems to display for this patient.   Past Surgical History:  Procedure Laterality Date  . BLADDER SURGERY    . CESAREAN SECTION    . CHOLECYSTECTOMY      OB History    No data available       Home Medications    Prior to Admission medications   Medication Sig Start Date End Date Taking? Authorizing Provider  ibuprofen (ADVIL,MOTRIN) 200 MG tablet Take 600 mg by mouth every 6 (six) hours as needed for headache, mild pain or moderate pain.   Yes Historical Provider, MD  acetaminophen (TYLENOL) 500 MG tablet Take 2 tablets (1,000 mg total) by mouth every 8  (eight) hours. Do not take more than 4000 mg of acetaminophen (Tylenol) in a 24-hour period. Please note that other medicines that you may be prescribed may have Tylenol as well. 12/06/15 12/11/15  Fatima Blank, MD  enalapril (VASOTEC) 10 MG tablet Take 1 tablet (10 mg total) by mouth daily. 12/06/15 01/05/16  Fatima Blank, MD  insulin aspart (NOVOLOG) 100 UNIT/ML injection Inject 10 Units into the skin 3 (three) times daily before meals. 12/06/15 01/05/16  Fatima Blank, MD  insulin glargine (LANTUS) 100 UNIT/ML injection Inject 0.25 mLs (25 Units total) into the skin at bedtime. 12/06/15 01/05/16  Fatima Blank, MD  methocarbamol (ROBAXIN) 500 MG tablet Take 1 tablet (500 mg total) by mouth 2 (two) times daily. 12/06/15 01/05/16  Fatima Blank, MD  pravastatin (PRAVACHOL) 40 MG tablet Take 1 tablet (40 mg total) by mouth daily. 12/06/15 01/05/16  Fatima Blank, MD    Family History No family history on file.  Social History Social History  Substance Use Topics  . Smoking status: Never Smoker  . Smokeless tobacco: Never Used  . Alcohol use No     Allergies   Latex and Morphine and related   Review of Systems Review of Systems  Constitutional: Negative for chills, fatigue and fever.  HENT: Negative for congestion and sore throat.   Eyes: Negative for photophobia and visual disturbance.  Respiratory: Negative for cough, chest tightness and shortness of  breath.   Cardiovascular: Negative for chest pain and palpitations.  Gastrointestinal: Negative for abdominal pain, blood in stool, bowel incontinence, diarrhea, nausea and vomiting.  Genitourinary: Negative for bladder incontinence, decreased urine volume and difficulty urinating.  Musculoskeletal: Positive for neck pain. Negative for back pain and neck stiffness.  Skin: Negative for rash.  Neurological: Positive for numbness. Negative for light-headedness and headaches.  Psychiatric/Behavioral:  Negative for confusion.  All other systems reviewed and are negative.    Physical Exam Updated Vital Signs BP 176/79   Pulse 93   Temp 98.7 F (37.1 C) (Oral)   Resp 22   Ht 5\' 2"  (1.575 m)   Wt 152 lb (68.9 kg)   SpO2 97%   BMI 27.80 kg/m   Physical Exam  Constitutional: She is oriented to person, place, and time. She appears well-developed and well-nourished. No distress.  HENT:  Head: Normocephalic and atraumatic.  Nose: Nose normal.  Eyes: Conjunctivae and EOM are normal. Pupils are equal, round, and reactive to light. Right eye exhibits no discharge. Left eye exhibits no discharge. No scleral icterus.  Neck: Normal range of motion. Neck supple. Muscular tenderness present. No spinous process tenderness present.  Cardiovascular: Normal rate and regular rhythm.  Exam reveals no gallop and no friction rub.   No murmur heard. Pulses:      Radial pulses are 2+ on the right side, and 2+ on the left side.  Pulmonary/Chest: Effort normal and breath sounds normal. No stridor. No respiratory distress. She has no rales.  Abdominal: Soft. She exhibits no distension. There is no tenderness.  Musculoskeletal: She exhibits no edema.       Cervical back: She exhibits tenderness. She exhibits no bony tenderness.       Back:  Neurological: She is alert and oriented to person, place, and time. A sensory deficit (in LUE) is present. No cranial nerve deficit.  Skin: Skin is warm and dry. No rash noted. She is not diaphoretic. No erythema.  Psychiatric: She has a normal mood and affect.  Vitals reviewed.    ED Treatments / Results  Labs (all labs ordered are listed, but only abnormal results are displayed) Labs Reviewed  I-STAT CHEM 8, ED - Abnormal; Notable for the following:       Result Value   Sodium 132 (*)    Chloride 96 (*)    BUN 23 (*)    Glucose, Bld 495 (*)    All other components within normal limits  CBG MONITORING, ED - Abnormal; Notable for the following:     Glucose-Capillary 416 (*)    All other components within normal limits  I-STAT VENOUS BLOOD GAS, ED - Abnormal; Notable for the following:    pO2, Ven 80.0 (*)    All other components within normal limits  BLOOD GAS, VENOUS    EKG  EKG Interpretation None       Radiology No results found.  Procedures Procedures (including critical care time)  Medications Ordered in ED Medications  insulin glargine (LANTUS) injection 25 Units (not administered)  acetaminophen (TYLENOL) tablet 1,000 mg (1,000 mg Oral Given 12/05/15 2218)  methocarbamol (ROBAXIN) tablet 500 mg (500 mg Oral Given 12/05/15 2218)  sodium chloride 0.9 % bolus 1,000 mL (1,000 mLs Intravenous New Bag/Given 12/06/15 0019)  insulin aspart (novoLOG) injection 8 Units (8 Units Subcutaneous Given 12/06/15 0020)  ketorolac (TORADOL) 15 MG/ML injection 15 mg (15 mg Intravenous Given 12/06/15 0026)     Initial Impression / Assessment and  Plan / ED Course  I have reviewed the triage vital signs and the nursing notes.  Pertinent labs & imaging results that were available during my care of the patient were reviewed by me and considered in my medical decision making (see chart for details).  Clinical Course    Presentation consistent with persistent cervical radiculopathy. Pain is stable without progression. No lower extremity weakness, bowel or urinary incontinence. No trauma or falls.  Given the fact the patient is been out of her insulin for a week and has been taking ibuprofen would like to obtain screening labs says for hyperglycemia and renal function.  Hyperglycemia with blood sugars in the 400s. No evidence of DKA. Renal functions intact. Patient provided with home dose of Lantus and 8 units of insulin as well as IV fluids.  She also provided with muscle relaxers and pain medicine which had some improvement in her pain.  Patient care turned over to Dr Vanita Panda and Anderson Malta, PA at 0100. Patient case and results discussed  in detail; please see their note for further ED managment.      Final Clinical Impressions(s) / ED Diagnoses   Final diagnoses:  Radiculopathy, unspecified spinal region  Hyperglycemia  Noncompliance with medications      Fatima Blank, MD 12/06/15 [phone]

## 2015-12-05 NOTE — ED Notes (Signed)
MD at bedside. 

## 2015-12-06 LAB — I-STAT VENOUS BLOOD GAS, ED
Acid-Base Excess: 1 mmol/L (ref 0.0–2.0)
Bicarbonate: 26.6 mmol/L (ref 20.0–28.0)
O2 Saturation: 95 %
TCO2: 28 mmol/L (ref 0–100)
pCO2, Ven: 45.1 mmHg (ref 44.0–60.0)
pH, Ven: 7.378 (ref 7.250–7.430)
pO2, Ven: 80 mmHg — ABNORMAL HIGH (ref 32.0–45.0)

## 2015-12-06 LAB — I-STAT CHEM 8, ED
BUN: 23 mg/dL — AB (ref 6–20)
CHLORIDE: 96 mmol/L — AB (ref 101–111)
CREATININE: 0.7 mg/dL (ref 0.44–1.00)
Calcium, Ion: 1.22 mmol/L (ref 1.15–1.40)
GLUCOSE: 495 mg/dL — AB (ref 65–99)
HEMATOCRIT: 39 % (ref 36.0–46.0)
HEMOGLOBIN: 13.3 g/dL (ref 12.0–15.0)
POTASSIUM: 3.8 mmol/L (ref 3.5–5.1)
Sodium: 132 mmol/L — ABNORMAL LOW (ref 135–145)
TCO2: 25 mmol/L (ref 0–100)

## 2015-12-06 LAB — CBG MONITORING, ED
GLUCOSE-CAPILLARY: 393 mg/dL — AB (ref 65–99)
GLUCOSE-CAPILLARY: 416 mg/dL — AB (ref 65–99)
Glucose-Capillary: 266 mg/dL — ABNORMAL HIGH (ref 65–99)

## 2015-12-06 MED ORDER — INSULIN ASPART 100 UNIT/ML ~~LOC~~ SOLN: 10.0000 [IU] | Freq: Three times a day (TID) | SUBCUTANEOUS | 0 refills | Status: DC

## 2015-12-06 MED ORDER — SODIUM CHLORIDE 0.9 % IV BOLUS (SEPSIS)
1000.0000 mL | Freq: Once | INTRAVENOUS | Status: AC
  Administered 2015-12-06: 1000 mL via INTRAVENOUS

## 2015-12-06 MED ORDER — PRAVASTATIN SODIUM 40 MG PO TABS: 40.0000 mg | ORAL_TABLET | Freq: Every day | ORAL | 0 refills | Status: DC

## 2015-12-06 MED ORDER — METHOCARBAMOL 500 MG PO TABS: 500.0000 mg | ORAL_TABLET | Freq: Two times a day (BID) | ORAL | 0 refills | Status: AC

## 2015-12-06 MED ORDER — ACETAMINOPHEN 500 MG PO TABS: 1000.0000 mg | ORAL_TABLET | Freq: Three times a day (TID) | ORAL | 0 refills | Status: AC

## 2015-12-06 MED ORDER — INSULIN ASPART 100 UNIT/ML ~~LOC~~ SOLN
8.0000 [IU] | Freq: Once | SUBCUTANEOUS | Status: AC
Start: 2015-12-06 — End: 2015-12-06
  Administered 2015-12-06: 8 [IU] via SUBCUTANEOUS
  Filled 2015-12-06: qty 1

## 2015-12-06 MED ORDER — KETOROLAC TROMETHAMINE 15 MG/ML IJ SOLN
15.0000 mg | Freq: Once | INTRAMUSCULAR | Status: AC
  Administered 2015-12-06: 15 mg via INTRAVENOUS
  Filled 2015-12-06: qty 1

## 2015-12-06 MED ORDER — INSULIN GLARGINE 100 UNIT/ML ~~LOC~~ SOLN: 25.0000 [IU] | Freq: Every day | SUBCUTANEOUS | 0 refills | Status: DC

## 2015-12-06 MED ORDER — INSULIN GLARGINE 100 UNIT/ML ~~LOC~~ SOLN
25.0000 [IU] | Freq: Once | SUBCUTANEOUS | Status: AC
  Administered 2015-12-06: 25 [IU] via SUBCUTANEOUS
  Filled 2015-12-06: qty 0.25

## 2015-12-06 MED ORDER — ENALAPRIL MALEATE 10 MG PO TABS: 10.0000 mg | ORAL_TABLET | Freq: Every day | ORAL | 0 refills | Status: DC

## 2015-12-06 NOTE — ED Provider Notes (Signed)
Patient signed out at end of shift by Dr. Addison Lank, pending treatment of hyperglycemia. Complains of neck pain, chronic, MRI recent, No neuro deficits tonight History high chol, HTN, DM - out of all meds CBG 400, no acidosis 8U Novolog, 25 U lantus (regular dos) given, receiving IVF's now  Plan: CBG after fluids - anything less than 300 ok  CBG after 1L fluids, 393 (45 minutes after Novolog) 2:50 - additional liter provided, recheck CBG 293. She is stable for discharge home per plan of previous treatment team.    Charlann Lange, PA-C 12/06/15 0255

## 2015-12-06 NOTE — ED Notes (Signed)
Patient left at this time with all belongings. 

## 2016-01-25 ENCOUNTER — Emergency Department (HOSPITAL_COMMUNITY)
Admission: EM | Admit: 2016-01-25 | Discharge: 2016-01-25 | Disposition: A | Discharge status: Home / Self Care | Payer: Medicaid Other | Attending: Emergency Medicine | Admitting: Emergency Medicine

## 2016-01-25 ENCOUNTER — Emergency Department (HOSPITAL_COMMUNITY): Payer: Medicaid Other

## 2016-01-25 ENCOUNTER — Encounter (HOSPITAL_COMMUNITY): Payer: Self-pay

## 2016-01-25 DIAGNOSIS — Z91040 Latex allergy status: Secondary | ICD-10-CM | POA: Diagnosis not present

## 2016-01-25 DIAGNOSIS — I160 Hypertensive urgency: Secondary | ICD-10-CM | POA: Diagnosis not present

## 2016-01-25 DIAGNOSIS — R51 Headache: Secondary | ICD-10-CM | POA: Diagnosis present

## 2016-01-25 DIAGNOSIS — E119 Type 2 diabetes mellitus without complications: Secondary | ICD-10-CM | POA: Diagnosis not present

## 2016-01-25 DIAGNOSIS — Z794 Long term (current) use of insulin: Secondary | ICD-10-CM | POA: Diagnosis not present

## 2016-01-25 LAB — CBG MONITORING, ED
Glucose-Capillary: 249 mg/dL — ABNORMAL HIGH (ref 65–99)
Glucose-Capillary: 253 mg/dL — ABNORMAL HIGH (ref 65–99)

## 2016-01-25 LAB — CBC
HEMATOCRIT: 37.9 % (ref 36.0–46.0)
Hemoglobin: 12.8 g/dL (ref 12.0–15.0)
MCH: 28.1 pg (ref 26.0–34.0)
MCHC: 33.8 g/dL (ref 30.0–36.0)
MCV: 83.3 fL (ref 78.0–100.0)
PLATELETS: 259 10*3/uL (ref 150–400)
RBC: 4.55 MIL/uL (ref 3.87–5.11)
RDW: 13.2 % (ref 11.5–15.5)
WBC: 6.8 10*3/uL (ref 4.0–10.5)

## 2016-01-25 LAB — BASIC METABOLIC PANEL
Anion gap: 9 (ref 5–15)
BUN: 20 mg/dL (ref 6–20)
CHLORIDE: 100 mmol/L — AB (ref 101–111)
CO2: 24 mmol/L (ref 22–32)
CREATININE: 0.82 mg/dL (ref 0.44–1.00)
Calcium: 9.6 mg/dL (ref 8.9–10.3)
GFR calc Af Amer: >60 mL/min (ref >60)
GFR calc non Af Amer: >60 mL/min (ref >60)
Glucose, Bld: 271 mg/dL — ABNORMAL HIGH (ref 65–99)
POTASSIUM: 4 mmol/L (ref 3.5–5.1)
Sodium: 133 mmol/L — ABNORMAL LOW (ref 135–145)

## 2016-01-25 MED ORDER — ENALAPRIL MALEATE 10 MG PO TABS: 10.0000 mg | ORAL_TABLET | Freq: Every day | ORAL | 0 refills | Status: DC

## 2016-01-25 MED ORDER — LISINOPRIL 10 MG PO TABS
10.0000 mg | ORAL_TABLET | Freq: Once | ORAL | Status: AC
  Administered 2016-01-25: 10 mg via ORAL
  Filled 2016-01-25: qty 1

## 2016-01-25 NOTE — ED Triage Notes (Signed)
Pt reports she was visiting her husband upstairs and began to have a headache. She reports hx of HTN and is out of her BP meds. Pt reports she began having a nosebleed upstairs and a mild dizzy spell with the nose bleed. Pt ambulatory, a&o X4.

## 2016-01-25 NOTE — Discharge Instructions (Signed)
As discussed, your evaluation today has been largely reassuring.  But, it is important that you monitor your condition carefully, and do not hesitate to return to the ED if you develop new, or concerning changes in your condition. ? ?Otherwise, please follow-up with your physician for appropriate ongoing care. ? ?

## 2016-01-25 NOTE — ED Provider Notes (Signed)
[facility] DEPT Provider Note   CSN: [id number] Arrival date & time: 01/25/16  1843     History   Chief Complaint Chief Complaint  Patient presents with  . Headache  . Hypertension  . Epistaxis    HPI Kara Hanson is a 60 y.o. female.  HPI Patient presents with concern of headache, epistaxis, dyspnea. Patient has multiple medical issues including diabetes, hypertension. Patient also has a notable social issue with her husband currently receiving therapy in this hospital.  Today, while visiting her husband, patient felt lightheaded, developed headache, subsequently was found to have epistaxis. Epistaxis has resolved prior to my evaluation, headache has diminished. She also has dyspnea, onset was earlier today as well. Patient notes that she has not taken her medication in at least 3 days   Past Medical History:  Diagnosis Date  . Diabetes mellitus without complication ([place])   . Hypercholesteremia   . Hypertension     There are no active problems to display for this patient.   Past Surgical History:  Procedure Laterality Date  . BLADDER SURGERY    . CESAREAN SECTION    . CHOLECYSTECTOMY      OB History    No data available       Home Medications    Prior to Admission medications   Medication Sig Start Date End Date Taking? Authorizing Provider  enalapril (VASOTEC) 10 MG tablet Take 10 mg by mouth daily.   Yes Historical Provider, MD  ibuprofen (ADVIL,MOTRIN) 200 MG tablet Take 600 mg by mouth every 6 (six) hours as needed for headache, mild pain or moderate pain.   Yes Historical Provider, MD  insulin aspart (NOVOLOG) 100 UNIT/ML injection Inject 10 Units into the skin 3 (three) times daily before meals.   Yes Historical Provider, MD  insulin glargine (LANTUS) 100 UNIT/ML injection Inject 25 Units into the skin at bedtime.   Yes Historical Provider, MD  pravastatin (PRAVACHOL) 40 MG tablet Take 40 mg by mouth every evening.   Yes Historical  Provider, MD  enalapril (VASOTEC) 10 MG tablet Take 1 tablet (10 mg total) by mouth daily. 12/06/15 01/05/16  Fatima Blank, MD  insulin aspart (NOVOLOG) 100 UNIT/ML injection Inject 10 Units into the skin 3 (three) times daily before meals. 12/06/15 01/05/16  Fatima Blank, MD  insulin glargine (LANTUS) 100 UNIT/ML injection Inject 0.25 mLs (25 Units total) into the skin at bedtime. 12/06/15 01/05/16  Fatima Blank, MD  pravastatin (PRAVACHOL) 40 MG tablet Take 1 tablet (40 mg total) by mouth daily. 12/06/15 01/05/16  Fatima Blank, MD    Family History History reviewed. No pertinent family history.  Social History Social History  Substance Use Topics  . Smoking status: Never Smoker  . Smokeless tobacco: Never Used  . Alcohol use No     Allergies   Latex and Morphine and related   Review of Systems Review of Systems  Constitutional:       Per HPI, otherwise negative  HENT: Positive for nosebleeds.   Respiratory:       Per HPI, otherwise negative  Cardiovascular:       Per HPI, otherwise negative  Gastrointestinal: Negative for vomiting.  Endocrine:       Negative aside from HPI  Genitourinary:       Neg aside from HPI   Musculoskeletal:       Per HPI, otherwise negative  Skin: Negative.   Neurological: Positive for headaches. Negative for syncope.  Physical Exam Updated Vital Signs BP 195/82 (BP Location: Right Arm)   Pulse 80   Temp 98.6 F (37 C) (Oral)   Resp 16   Ht 4\' 11"  (1.499 m)   SpO2 97%   Physical Exam  Constitutional: She is oriented to person, place, and time. She appears well-developed and well-nourished. No distress.  HENT:  Head: Normocephalic and atraumatic.  No active bleeding  Eyes: Conjunctivae and EOM are normal.  Cardiovascular: Normal rate and regular rhythm.   Pulmonary/Chest: Effort normal and breath sounds normal. No stridor. No respiratory distress.  Abdominal: She exhibits no distension.    Musculoskeletal: She exhibits no edema.  Neurological: She is alert and oriented to person, place, and time. No cranial nerve deficit.  Skin: Skin is warm and dry.  Psychiatric: She has a normal mood and affect.  Nursing note and vitals reviewed.  Initial BP 190 / 85  ED Treatments / Results  Labs (all labs ordered are listed, but only abnormal results are displayed) Labs Reviewed  BASIC METABOLIC PANEL - Abnormal; Notable for the following:       Result Value   Sodium 133 (*)    Chloride 100 (*)    Glucose, Bld 271 (*)    All other components within normal limits  CBG MONITORING, ED - Abnormal; Notable for the following:    Glucose-Capillary 249 (*)    All other components within normal limits  CBG MONITORING, ED - Abnormal; Notable for the following:    Glucose-Capillary 253 (*)    All other components within normal limits  CBC    EKG  EKG Interpretation  Date/Time:  Monday January 25 2016 20:36:42 EDT Ventricular Rate:  76 PR Interval:    QRS Duration: 88 QT Interval:  373 QTC Calculation: 420 R Axis:   19 Text Interpretation:  Sinus rhythm Nonspecific T abnrm, anterolateral leads Borderline ECG Confirmed by Carmin Muskrat  MD ([id number]) on 01/25/2016 10:15:42 PM       Radiology Dg Chest 2 View  Result Date: 01/25/2016 CLINICAL DATA:  Initial evaluation for acute dyspnea. EXAM: CHEST  2 VIEW COMPARISON:  Prior CT from 10/01/2015. FINDINGS: The cardiac and mediastinal silhouettes are stable in size and contour, and remain within normal limits. The lungs are normally inflated. No airspace consolidation, pleural effusion, or pulmonary edema is identified. There is no pneumothorax. No acute osseous abnormality identified. IMPRESSION: No active cardiopulmonary disease. Electronically Signed   By: Jeannine Boga M.D.   On: 01/25/2016 20:55    Procedures Procedures (including critical care time)  Medications Ordered in ED Medications  lisinopril  (PRINIVIL,ZESTRIL) tablet 10 mg (not administered)     Initial Impression / Assessment and Plan / ED Course  I have reviewed the triage vital signs and the nursing notes.  Pertinent labs & imaging results that were available during my care of the patient were reviewed by me and considered in my medical decision making (see chart for details).  Clinical Course   10:15 PM Patient sleeping. Blood pressure has reduced 15%, she appears calm. I discussed all findings with the patient's family members, specific need to take all medication as directed, including blood pressure medication.     Final Clinical Impressions(s) / ED Diagnoses  Patient presents with chest pain, dyspnea. Notably, the patient has substantial stress, including additional family members in the hospital. Here the patient is awake, alert, no hypoxia, tachypnea, tachycardia, no evidence for ongoing coronary ischemia, pulmonary embolism, pneumonia. Patient's blood pressure likely  contributes to her presentation, and this was treated with lisinopril, with appropriate reduction. Patient provided refill of her prescriptions, discharged with close monitoring, primary care follow-up.    Carmin Muskrat, MD 01/25/16 2216

## 2016-02-04 ENCOUNTER — Encounter (HOSPITAL_COMMUNITY): Payer: Self-pay | Admitting: Emergency Medicine

## 2016-02-04 DIAGNOSIS — M79661 Pain in right lower leg: Secondary | ICD-10-CM | POA: Diagnosis present

## 2016-02-04 DIAGNOSIS — Z794 Long term (current) use of insulin: Secondary | ICD-10-CM | POA: Diagnosis not present

## 2016-02-04 DIAGNOSIS — Z91040 Latex allergy status: Secondary | ICD-10-CM | POA: Diagnosis not present

## 2016-02-04 DIAGNOSIS — E119 Type 2 diabetes mellitus without complications: Secondary | ICD-10-CM | POA: Diagnosis not present

## 2016-02-04 DIAGNOSIS — I10 Essential (primary) hypertension: Secondary | ICD-10-CM | POA: Insufficient documentation

## 2016-02-04 DIAGNOSIS — Z79899 Other long term (current) drug therapy: Secondary | ICD-10-CM | POA: Diagnosis not present

## 2016-02-04 NOTE — ED Triage Notes (Signed)
Pt states she started to feel sick early this morning and laid down. Once she woke up she started to have a burning pain in her shin area. Denies any recent travel or long car rides. Denies blood thinners or birth control.

## 2016-02-05 ENCOUNTER — Ambulatory Visit (HOSPITAL_COMMUNITY)
Admission: RE | Admit: 2016-02-05 | Discharge: 2016-02-05 | Disposition: A | Discharge status: Home / Self Care | Payer: Medicaid Other | Source: Ambulatory Visit | Attending: Emergency Medicine | Admitting: Emergency Medicine

## 2016-02-05 ENCOUNTER — Emergency Department (HOSPITAL_COMMUNITY): Payer: Medicaid Other

## 2016-02-05 ENCOUNTER — Emergency Department (HOSPITAL_COMMUNITY)
Admission: EM | Admit: 2016-02-05 | Discharge: 2016-02-05 | Disposition: A | Discharge status: Home / Self Care | Payer: Medicaid Other | Attending: Emergency Medicine | Admitting: Emergency Medicine

## 2016-02-05 ENCOUNTER — Ambulatory Visit (HOSPITAL_BASED_OUTPATIENT_CLINIC_OR_DEPARTMENT_OTHER)
Admission: RE | Admit: 2016-02-05 | Discharge: 2016-02-05 | Disposition: A | Discharge status: Home / Self Care | Payer: Medicaid Other | Source: Ambulatory Visit | Attending: Emergency Medicine | Admitting: Emergency Medicine

## 2016-02-05 DIAGNOSIS — M79661 Pain in right lower leg: Secondary | ICD-10-CM

## 2016-02-05 MED ORDER — ENOXAPARIN SODIUM 80 MG/0.8ML ~~LOC~~ SOLN
1.0000 mg/kg | Freq: Once | SUBCUTANEOUS | Status: AC
  Administered 2016-02-05: 75 mg via SUBCUTANEOUS
  Filled 2016-02-05: qty 0.8

## 2016-02-05 MED ORDER — OXYCODONE-ACETAMINOPHEN 5-325 MG PO TABS
1.0000 | ORAL_TABLET | Freq: Once | ORAL | Status: AC
  Administered 2016-02-05: 1 via ORAL
  Filled 2016-02-05: qty 1

## 2016-02-05 MED ORDER — HYDROCODONE-ACETAMINOPHEN 5-325 MG PO TABS: 1.0000 | ORAL_TABLET | Freq: Four times a day (QID) | ORAL | 0 refills | Status: DC | PRN

## 2016-02-05 MED ORDER — IBUPROFEN 200 MG PO TABS
600.0000 mg | ORAL_TABLET | Freq: Once | ORAL | Status: AC
  Administered 2016-02-05: 600 mg via ORAL
  Filled 2016-02-05: qty 1

## 2016-02-05 NOTE — ED Provider Notes (Signed)
[facility] DEPT Provider Note   CSN: [id number] Arrival date & time: 02/04/16  2222 By signing my name below, I, Doran Stabler, attest that this documentation has been prepared under the direction and in the presence of Jola Schmidt, MD. Electronically Signed: Doran Stabler, ED Scribe. 02/05/16. 1:37 AM.  History   Chief Complaint Chief Complaint  Patient presents with  . Leg Pain    right lower leg   The history is provided by the patient. No language interpreter was used.   HPI Comments: Kara Hanson is a 60 y.o. female who presents to the Emergency Department with a PMHx of DM and HTN complaining of right leg pain that began earlier today. Pt states her pain is below her knee and describer her pain as a "burning sensation". She first noticed it while sitting. Pt also reports fatigue. She took Tylenol with no relief. Pt denies any SOB, CP, or any other symptoms at this time. Pt denies any recent long travels.   Past Medical History:  Diagnosis Date  . Diabetes mellitus without complication ([place])   . Hypercholesteremia   . Hypertension    There are no active problems to display for this patient.  Past Surgical History:  Procedure Laterality Date  . BLADDER SURGERY    . CESAREAN SECTION    . CHOLECYSTECTOMY     OB History    No data available     Home Medications    Prior to Admission medications   Medication Sig Start Date End Date Taking? Authorizing Provider  enalapril (VASOTEC) 10 MG tablet Take 1 tablet (10 mg total) by mouth daily. 01/25/16 02/24/16  Carmin Muskrat, MD  ibuprofen (ADVIL,MOTRIN) 200 MG tablet Take 600 mg by mouth every 6 (six) hours as needed for headache, mild pain or moderate pain.    Historical Provider, MD  insulin aspart (NOVOLOG) 100 UNIT/ML injection Inject 10 Units into the skin 3 (three) times daily before meals. 12/06/15 01/05/16  Fatima Blank, MD  insulin aspart (NOVOLOG) 100 UNIT/ML injection Inject 10 Units into the skin 3  (three) times daily before meals.    Historical Provider, MD  insulin glargine (LANTUS) 100 UNIT/ML injection Inject 0.25 mLs (25 Units total) into the skin at bedtime. 12/06/15 01/05/16  Fatima Blank, MD  insulin glargine (LANTUS) 100 UNIT/ML injection Inject 25 Units into the skin at bedtime.    Historical Provider, MD  pravastatin (PRAVACHOL) 40 MG tablet Take 1 tablet (40 mg total) by mouth daily. 12/06/15 01/05/16  Fatima Blank, MD  pravastatin (PRAVACHOL) 40 MG tablet Take 40 mg by mouth every evening.    Historical Provider, MD   Family History No family history on file.  Social History Social History  Substance Use Topics  . Smoking status: Never Smoker  . Smokeless tobacco: Never Used  . Alcohol use No   Allergies   Latex and Morphine and related  Review of Systems Review of Systems A complete 10 system review of systems was obtained and all systems are negative except as noted in the HPI and PMH.   Physical Exam Updated Vital Signs BP 161/61   Pulse 78   Temp 98.4 F (36.9 C) (Oral)   Resp 18   Ht 4\' 11"  (1.499 m)   Wt 167 lb 8 oz (76 kg)   SpO2 99%   BMI 33.83 kg/m   Physical Exam  Constitutional: She is oriented to person, place, and time. She appears well-developed and well-nourished. No distress.  HENT:  Head: Normocephalic and atraumatic.  Eyes: EOM are normal.  Neck: Normal range of motion.  Cardiovascular: Normal rate, regular rhythm and normal heart sounds.   Pulmonary/Chest: Effort normal and breath sounds normal.  Abdominal: Soft. She exhibits no distension. There is no tenderness.  Musculoskeletal: Normal range of motion.  Normal PT and DP pulse in the right lower extremity.  No swelling of the right lower extremity as compared to left.  No erythema or discoloration of the right lower extremity.  Mild tenderness along the right anterior tibia as well as right medial thigh without significant deformity or swelling.  No erythema.  No rash  noted.  Skin is sensitive to light touch  Neurological: She is alert and oriented to person, place, and time.  Skin: Skin is warm and dry.  Psychiatric: She has a normal mood and affect. Judgment normal.  Nursing note and vitals reviewed.   ED Treatments / Results  DIAGNOSTIC STUDIES: Oxygen Saturation is 98% on room air, normal by my interpretation.    COORDINATION OF CARE: 1:37 AM Discussed treatment plan with pt at bedside and pt agreed to plan.  Labs (all labs ordered are listed, but only abnormal results are displayed) Labs Reviewed - No data to display  EKG  EKG Interpretation None      Radiology No results found.  Procedures Procedures (including critical care time)  Medications Ordered in ED Medications  enoxaparin (LOVENOX) injection 75 mg (not administered)  ibuprofen (ADVIL,MOTRIN) tablet 600 mg (not administered)  oxyCODONE-acetaminophen (PERCOCET/ROXICET) 5-325 MG per tablet 1 tablet (not administered)    Initial Impression / Assessment and Plan / ED Course  I have reviewed the triage vital signs and the nursing notes.  Pertinent labs & imaging results that were available during my care of the patient were reviewed by me and considered in my medical decision making (see chart for details).  Clinical Course    Patient is overall well-appearing.  She has normal pulses in the right lower extremity.  There is no significant swelling of her right lower extremity.  She is tender along the right medial thigh and therefore she will need a venous duplex to evaluate for DVT.  Interestingly even to light touch her skin is very sensitive.  This could end up being a zoster-like presentation without rash.  She is given a single dose of Lovenox.  She will return to the hospital in the morning for a venous duplex of her right lower extremity.  Right tibia plain film demonstrates no osseous injury.  Primary care follow-up.  Final Clinical Impressions(s) / ED Diagnoses    Final diagnoses:  None    New Prescriptions New Prescriptions   HYDROCODONE-ACETAMINOPHEN (NORCO/VICODIN) 5-325 MG TABLET    Take 1 tablet by mouth every 6 (six) hours as needed for moderate pain.   I personally performed the services described in this documentation, which was scribed in my presence. The recorded information has been reviewed and is accurate.        Jola Schmidt, MD 02/05/16 [phone]

## 2016-02-05 NOTE — ED Notes (Signed)
Patient returned to room from x-ray.

## 2016-02-05 NOTE — ED Notes (Signed)
Patient transported to X-ray 

## 2016-02-05 NOTE — Discharge Instructions (Signed)
IMPORTANT PATIENT INSTRUCTIONS:  ° °You have been scheduled for an Outpatient Vascular Study at [facility].   ° °If tomorrow is a Saturday or Sunday, please go to the [facility] registration desk at 8 AM tomorrow morning and tell them you are therefore a vascular study. ° °If tomorrow is a weekday (Monday - Friday), please go to the [facility] Admitting Department at 8 AM and tell them you are therefore a vascular study ° ° °

## 2016-02-05 NOTE — ED Notes (Signed)
ED Provider at bedside. 

## 2016-02-05 NOTE — Progress Notes (Signed)
*  PRELIMINARY RESULTS* Vascular Ultrasound Right lower extremity venous duplex has been completed.  Preliminary findings: No evidence of DVT or baker's cyst.  Landry Mellow, RDMS, RVT  02/05/2016, 3:01 PM

## 2016-04-22 ENCOUNTER — Encounter (HOSPITAL_COMMUNITY): Payer: Self-pay

## 2016-04-22 ENCOUNTER — Inpatient Hospital Stay (HOSPITAL_COMMUNITY)
Admission: EM | Admit: 2016-04-22 | Discharge: 2016-04-26 | DRG: 287 | Disposition: A | Discharge status: Home / Self Care | Payer: Medicaid Other | Attending: Internal Medicine | Admitting: Internal Medicine

## 2016-04-22 DIAGNOSIS — Z794 Long term (current) use of insulin: Secondary | ICD-10-CM

## 2016-04-22 DIAGNOSIS — Z79891 Long term (current) use of opiate analgesic: Secondary | ICD-10-CM

## 2016-04-22 DIAGNOSIS — E7800 Pure hypercholesterolemia, unspecified: Secondary | ICD-10-CM | POA: Diagnosis present

## 2016-04-22 DIAGNOSIS — E1151 Type 2 diabetes mellitus with diabetic peripheral angiopathy without gangrene: Secondary | ICD-10-CM | POA: Diagnosis present

## 2016-04-22 DIAGNOSIS — Z79899 Other long term (current) drug therapy: Secondary | ICD-10-CM

## 2016-04-22 DIAGNOSIS — Z7982 Long term (current) use of aspirin: Secondary | ICD-10-CM

## 2016-04-22 DIAGNOSIS — Z6833 Body mass index (BMI) 33.0-33.9, adult: Secondary | ICD-10-CM

## 2016-04-22 DIAGNOSIS — E785 Hyperlipidemia, unspecified: Secondary | ICD-10-CM | POA: Diagnosis present

## 2016-04-22 DIAGNOSIS — Y92230 Patient room in hospital as the place of occurrence of the external cause: Secondary | ICD-10-CM | POA: Diagnosis not present

## 2016-04-22 DIAGNOSIS — Z8673 Personal history of transient ischemic attack (TIA), and cerebral infarction without residual deficits: Secondary | ICD-10-CM

## 2016-04-22 DIAGNOSIS — Z91040 Latex allergy status: Secondary | ICD-10-CM

## 2016-04-22 DIAGNOSIS — R55 Syncope and collapse: Secondary | ICD-10-CM | POA: Diagnosis present

## 2016-04-22 DIAGNOSIS — R410 Disorientation, unspecified: Secondary | ICD-10-CM

## 2016-04-22 DIAGNOSIS — D649 Anemia, unspecified: Secondary | ICD-10-CM | POA: Diagnosis present

## 2016-04-22 DIAGNOSIS — Z885 Allergy status to narcotic agent status: Secondary | ICD-10-CM

## 2016-04-22 DIAGNOSIS — M4800 Spinal stenosis, site unspecified: Secondary | ICD-10-CM | POA: Diagnosis present

## 2016-04-22 DIAGNOSIS — R51 Headache: Secondary | ICD-10-CM | POA: Diagnosis not present

## 2016-04-22 DIAGNOSIS — D6489 Other specified anemias: Secondary | ICD-10-CM | POA: Diagnosis present

## 2016-04-22 DIAGNOSIS — T463X5A Adverse effect of coronary vasodilators, initial encounter: Secondary | ICD-10-CM | POA: Diagnosis not present

## 2016-04-22 DIAGNOSIS — I5181 Takotsubo syndrome: Secondary | ICD-10-CM | POA: Diagnosis present

## 2016-04-22 DIAGNOSIS — I10 Essential (primary) hypertension: Secondary | ICD-10-CM | POA: Diagnosis present

## 2016-04-22 DIAGNOSIS — E669 Obesity, unspecified: Secondary | ICD-10-CM | POA: Diagnosis present

## 2016-04-22 DIAGNOSIS — R079 Chest pain, unspecified: Principal | ICD-10-CM | POA: Diagnosis present

## 2016-04-22 DIAGNOSIS — I519 Heart disease, unspecified: Secondary | ICD-10-CM

## 2016-04-22 DIAGNOSIS — I429 Cardiomyopathy, unspecified: Secondary | ICD-10-CM | POA: Diagnosis present

## 2016-04-22 DIAGNOSIS — I700 Atherosclerosis of aorta: Secondary | ICD-10-CM | POA: Diagnosis present

## 2016-04-22 DIAGNOSIS — R0602 Shortness of breath: Secondary | ICD-10-CM

## 2016-04-22 DIAGNOSIS — E1165 Type 2 diabetes mellitus with hyperglycemia: Secondary | ICD-10-CM | POA: Diagnosis present

## 2016-04-22 DIAGNOSIS — R42 Dizziness and giddiness: Secondary | ICD-10-CM | POA: Diagnosis present

## 2016-04-22 DIAGNOSIS — E876 Hypokalemia: Secondary | ICD-10-CM | POA: Diagnosis present

## 2016-04-22 HISTORY — DX: Spinal stenosis, site unspecified: M48.00

## 2016-04-22 NOTE — ED Triage Notes (Signed)
Pt brought in by [facility]. Pt c/o dizziness and syncopal episode at home. Pt states she has not been feeling well for 2 days. Pt states syncopal episode was earlier today and denies head strike or any other injuries. Pt also c/o left sided localized sharp chest pain starting earlier today. Denies SOB, nausea and vomiting. Given 324 of ASA on transport. Pt in NAD.

## 2016-04-23 ENCOUNTER — Emergency Department (HOSPITAL_COMMUNITY): Payer: Medicaid Other

## 2016-04-23 ENCOUNTER — Observation Stay (HOSPITAL_COMMUNITY): Payer: Medicaid Other

## 2016-04-23 ENCOUNTER — Encounter (HOSPITAL_COMMUNITY): Payer: Self-pay | Admitting: Internal Medicine

## 2016-04-23 ENCOUNTER — Observation Stay (HOSPITAL_BASED_OUTPATIENT_CLINIC_OR_DEPARTMENT_OTHER): Payer: Medicaid Other

## 2016-04-23 DIAGNOSIS — D649 Anemia, unspecified: Secondary | ICD-10-CM | POA: Diagnosis present

## 2016-04-23 DIAGNOSIS — R55 Syncope and collapse: Secondary | ICD-10-CM

## 2016-04-23 DIAGNOSIS — Z794 Long term (current) use of insulin: Secondary | ICD-10-CM

## 2016-04-23 DIAGNOSIS — E1165 Type 2 diabetes mellitus with hyperglycemia: Secondary | ICD-10-CM | POA: Diagnosis present

## 2016-04-23 DIAGNOSIS — R0789 Other chest pain: Secondary | ICD-10-CM | POA: Diagnosis not present

## 2016-04-23 DIAGNOSIS — I10 Essential (primary) hypertension: Secondary | ICD-10-CM | POA: Diagnosis not present

## 2016-04-23 DIAGNOSIS — R079 Chest pain, unspecified: Secondary | ICD-10-CM | POA: Diagnosis not present

## 2016-04-23 DIAGNOSIS — I420 Dilated cardiomyopathy: Secondary | ICD-10-CM | POA: Diagnosis not present

## 2016-04-23 DIAGNOSIS — I519 Heart disease, unspecified: Secondary | ICD-10-CM | POA: Diagnosis not present

## 2016-04-23 HISTORY — DX: Syncope and collapse: R55

## 2016-04-23 HISTORY — DX: Anemia, unspecified: D64.9

## 2016-04-23 HISTORY — DX: Type 2 diabetes mellitus with hyperglycemia: E11.65

## 2016-04-23 LAB — CBC WITH DIFFERENTIAL/PLATELET
BASOS ABS: 0 10*3/uL (ref 0.0–0.1)
BASOS PCT: 0 %
Basophils Absolute: 0 10*3/uL (ref 0.0–0.1)
Basophils Relative: 0 %
EOS ABS: 0.1 10*3/uL (ref 0.0–0.7)
EOS ABS: 0.1 10*3/uL (ref 0.0–0.7)
EOS PCT: 1 %
Eosinophils Relative: 2 %
HCT: 34.5 % — ABNORMAL LOW (ref 36.0–46.0)
HEMATOCRIT: 32.8 % — AB (ref 36.0–46.0)
HEMOGLOBIN: 11.1 g/dL — AB (ref 12.0–15.0)
Hemoglobin: 11.9 g/dL — ABNORMAL LOW (ref 12.0–15.0)
LYMPHS ABS: 1.7 10*3/uL (ref 0.7–4.0)
Lymphocytes Relative: 29 %
Lymphocytes Relative: 32 %
Lymphs Abs: 1.7 10*3/uL (ref 0.7–4.0)
MCH: 28 pg (ref 26.0–34.0)
MCH: 28.3 pg (ref 26.0–34.0)
MCHC: 33.8 g/dL (ref 30.0–36.0)
MCHC: 34.5 g/dL (ref 30.0–36.0)
MCV: 82.1 fL (ref 78.0–100.0)
MCV: 82.8 fL (ref 78.0–100.0)
MONOS PCT: 6 %
MONOS PCT: 7 %
Monocytes Absolute: 0.3 10*3/uL (ref 0.1–1.0)
Monocytes Absolute: 0.3 10*3/uL (ref 0.1–1.0)
NEUTROS ABS: 3.1 10*3/uL (ref 1.7–7.7)
NEUTROS PCT: 59 %
Neutro Abs: 3.7 10*3/uL (ref 1.7–7.7)
Neutrophils Relative %: 64 %
PLATELETS: 218 10*3/uL (ref 150–400)
Platelets: 180 10*3/uL (ref 150–400)
RBC: 3.96 MIL/uL (ref 3.87–5.11)
RBC: 4.2 MIL/uL (ref 3.87–5.11)
RDW: 13.1 % (ref 11.5–15.5)
RDW: 13.1 % (ref 11.5–15.5)
WBC: 5.2 10*3/uL (ref 4.0–10.5)
WBC: 5.8 10*3/uL (ref 4.0–10.5)

## 2016-04-23 LAB — ECHOCARDIOGRAM COMPLETE
HEIGHTINCHES: 59 in
WEIGHTICAEL: 2702.4 [oz_av]

## 2016-04-23 LAB — BASIC METABOLIC PANEL
ANION GAP: 5 (ref 5–15)
BUN: 19 mg/dL (ref 6–20)
CALCIUM: 8.3 mg/dL — AB (ref 8.9–10.3)
CO2: 23 mmol/L (ref 22–32)
CREATININE: 0.69 mg/dL (ref 0.44–1.00)
Chloride: 105 mmol/L (ref 101–111)
Glucose, Bld: 209 mg/dL — ABNORMAL HIGH (ref 65–99)
Potassium: 4 mmol/L (ref 3.5–5.1)
SODIUM: 133 mmol/L — AB (ref 135–145)

## 2016-04-23 LAB — COMPREHENSIVE METABOLIC PANEL
ALT: 19 U/L (ref 14–54)
ANION GAP: 8 (ref 5–15)
AST: 14 U/L — ABNORMAL LOW (ref 15–41)
Albumin: 3 g/dL — ABNORMAL LOW (ref 3.5–5.0)
Alkaline Phosphatase: 64 U/L (ref 38–126)
BUN: 20 mg/dL (ref 6–20)
CALCIUM: 8.6 mg/dL — AB (ref 8.9–10.3)
CHLORIDE: 105 mmol/L (ref 101–111)
CO2: 22 mmol/L (ref 22–32)
Creatinine, Ser: 0.75 mg/dL (ref 0.44–1.00)
GFR calc non Af Amer: >60 mL/min (ref >60)
Glucose, Bld: 192 mg/dL — ABNORMAL HIGH (ref 65–99)
Potassium: 3.3 mmol/L — ABNORMAL LOW (ref 3.5–5.1)
SODIUM: 135 mmol/L (ref 135–145)
Total Bilirubin: 0.2 mg/dL — ABNORMAL LOW (ref 0.3–1.2)
Total Protein: 5.4 g/dL — ABNORMAL LOW (ref 6.5–8.1)

## 2016-04-23 LAB — TROPONIN I

## 2016-04-23 LAB — GLUCOSE, CAPILLARY
GLUCOSE-CAPILLARY: 210 mg/dL — AB (ref 65–99)
GLUCOSE-CAPILLARY: 210 mg/dL — AB (ref 65–99)
Glucose-Capillary: 304 mg/dL — ABNORMAL HIGH (ref 65–99)
Glucose-Capillary: 307 mg/dL — ABNORMAL HIGH (ref 65–99)
Glucose-Capillary: 142 mg/dL — ABNORMAL HIGH (ref 65–99)

## 2016-04-23 LAB — MRSA PCR SCREENING: MRSA BY PCR: NEGATIVE

## 2016-04-23 LAB — TSH: TSH: 2.153 u[IU]/mL (ref 0.350–4.500)

## 2016-04-23 LAB — LIPASE, BLOOD: Lipase: 26 U/L (ref 11–51)

## 2016-04-23 LAB — MAGNESIUM: MAGNESIUM: 1.6 mg/dL — AB (ref 1.7–2.4)

## 2016-04-23 MED ORDER — HYDROMORPHONE HCL 2 MG/ML IJ SOLN
0.5000 mg | Freq: Once | INTRAMUSCULAR | Status: AC
  Administered 2016-04-23: 0.5 mg via INTRAVENOUS
  Filled 2016-04-23: qty 1

## 2016-04-23 MED ORDER — HYDROCODONE-ACETAMINOPHEN 5-325 MG PO TABS
1.0000 | ORAL_TABLET | Freq: Four times a day (QID) | ORAL | Status: DC | PRN
  Administered 2016-04-23 – 2016-04-26 (×8): 1 via ORAL
  Filled 2016-04-23 (×8): qty 1

## 2016-04-23 MED ORDER — SIMETHICONE 80 MG PO CHEW
80.0000 mg | CHEWABLE_TABLET | Freq: Four times a day (QID) | ORAL | Status: DC | PRN
  Administered 2016-04-23: 80 mg via ORAL
  Filled 2016-04-23: qty 1

## 2016-04-23 MED ORDER — ASPIRIN 325 MG PO TABS: 325.0000 mg | ORAL_TABLET | Freq: Once | ORAL | Status: DC

## 2016-04-23 MED ORDER — RANITIDINE HCL 150 MG/10ML PO SYRP
300.0000 mg | ORAL_SOLUTION | Freq: Once | ORAL | Status: AC
  Administered 2016-04-23: 300 mg via ORAL
  Filled 2016-04-23: qty 20

## 2016-04-23 MED ORDER — CARVEDILOL 3.125 MG PO TABS
3.1250 mg | ORAL_TABLET | Freq: Two times a day (BID) | ORAL | Status: DC
  Administered 2016-04-23: 3.125 mg via ORAL
  Filled 2016-04-23: qty 1

## 2016-04-23 MED ORDER — ONDANSETRON HCL 4 MG/2ML IJ SOLN: 4.0000 mg | Freq: Four times a day (QID) | INTRAMUSCULAR | Status: DC | PRN

## 2016-04-23 MED ORDER — SODIUM CHLORIDE 0.9 % IV BOLUS (SEPSIS)
2000.0000 mL | Freq: Once | INTRAVENOUS | Status: AC
Start: 2016-04-23 — End: 2016-04-23
  Administered 2016-04-23: 2000 mL via INTRAVENOUS

## 2016-04-23 MED ORDER — POTASSIUM CHLORIDE CRYS ER 20 MEQ PO TBCR
40.0000 meq | EXTENDED_RELEASE_TABLET | Freq: Once | ORAL | Status: AC
  Administered 2016-04-23: 40 meq via ORAL
  Filled 2016-04-23: qty 2

## 2016-04-23 MED ORDER — SODIUM CHLORIDE 0.9 % IV SOLN
INTRAVENOUS | Status: DC
  Administered 2016-04-23: 07:00:00 via INTRAVENOUS

## 2016-04-23 MED ORDER — ENOXAPARIN SODIUM 40 MG/0.4ML ~~LOC~~ SOLN
40.0000 mg | SUBCUTANEOUS | Status: DC
  Administered 2016-04-23 – 2016-04-24 (×2): 40 mg via SUBCUTANEOUS
  Filled 2016-04-23 (×2): qty 0.4

## 2016-04-23 MED ORDER — ASPIRIN EC 325 MG PO TBEC
325.0000 mg | DELAYED_RELEASE_TABLET | Freq: Every day | ORAL | Status: DC
  Administered 2016-04-23 – 2016-04-25 (×3): 325 mg via ORAL
  Filled 2016-04-23 (×3): qty 1

## 2016-04-23 MED ORDER — MAGNESIUM SULFATE 2 GM/50ML IV SOLN
2.0000 g | Freq: Once | INTRAVENOUS | Status: AC
  Administered 2016-04-23: 2 g via INTRAVENOUS
  Filled 2016-04-23: qty 50

## 2016-04-23 MED ORDER — INSULIN ASPART 100 UNIT/ML ~~LOC~~ SOLN: 0.0000 [IU] | SUBCUTANEOUS | Status: DC

## 2016-04-23 MED ORDER — IOPAMIDOL (ISOVUE-370) INJECTION 76%
INTRAVENOUS | Status: AC
  Administered 2016-04-23: 100 mL
  Filled 2016-04-23: qty 100

## 2016-04-23 MED ORDER — PRAVASTATIN SODIUM 40 MG PO TABS
40.0000 mg | ORAL_TABLET | Freq: Every evening | ORAL | Status: DC
  Administered 2016-04-23 – 2016-04-25 (×3): 40 mg via ORAL
  Filled 2016-04-23 (×3): qty 1

## 2016-04-23 MED ORDER — ACETAMINOPHEN 325 MG PO TABS
650.0000 mg | ORAL_TABLET | ORAL | Status: DC | PRN
  Administered 2016-04-23 (×2): 650 mg via ORAL
  Filled 2016-04-23 (×2): qty 2

## 2016-04-23 MED ORDER — INSULIN ASPART 100 UNIT/ML ~~LOC~~ SOLN
0.0000 [IU] | SUBCUTANEOUS | Status: DC
  Administered 2016-04-23: 7 [IU] via SUBCUTANEOUS
  Administered 2016-04-23 (×2): 3 [IU] via SUBCUTANEOUS
  Administered 2016-04-23: 7 [IU] via SUBCUTANEOUS
  Administered 2016-04-24: 5 [IU] via SUBCUTANEOUS
  Administered 2016-04-24: 2 [IU] via SUBCUTANEOUS
  Administered 2016-04-24: 3 [IU] via SUBCUTANEOUS
  Administered 2016-04-24 (×3): 1 [IU] via SUBCUTANEOUS
  Administered 2016-04-24: 5 [IU] via SUBCUTANEOUS
  Administered 2016-04-26: 2 [IU] via SUBCUTANEOUS
  Administered 2016-04-26: 3 [IU] via SUBCUTANEOUS

## 2016-04-23 MED ORDER — INSULIN GLARGINE 100 UNIT/ML ~~LOC~~ SOLN
25.0000 [IU] | Freq: Every day | SUBCUTANEOUS | Status: DC
  Administered 2016-04-23 – 2016-04-25 (×3): 25 [IU] via SUBCUTANEOUS
  Filled 2016-04-23 (×3): qty 0.25

## 2016-04-23 MED ORDER — NITROGLYCERIN 0.4 MG/HR TD PT24
0.4000 mg | MEDICATED_PATCH | Freq: Every day | TRANSDERMAL | Status: DC
  Administered 2016-04-23: 0.4 mg via TRANSDERMAL
  Filled 2016-04-23: qty 1

## 2016-04-23 NOTE — Progress Notes (Signed)
  Echocardiogram 2D Echocardiogram has been performed.  Kara Hanson 04/23/2016, 11:51 AM

## 2016-04-23 NOTE — Progress Notes (Signed)
Pt seen and examined at bedside, admitted after midnight, please see earlier admission note by Dr. Hal Hope. Pt admitted for evaluation of intermittent chest pain, cardiology consulted, ECHO notable for EF of 30-35%, plan for cath on Monday. Obtain CBC and BMP in AM.   Faye Ramsay, MD  [facility] Pager [phone] Cell [phone]  If 7PM-7AM, please contact night-coverage [email or web address] Password [email or web address]

## 2016-04-23 NOTE — ED Notes (Signed)
Dr. Hal Hope advised he would change patient's bed request to step down since pt c/o chest pressure still. Sonia Baller on 2W made aware that patient will not be coming to their unit at this time. ED Charge RN Shanon Brow also made aware.

## 2016-04-23 NOTE — ED Notes (Signed)
This RN paged Dr. Hal Hope to confirm that MD is still comfortable with patient going to 2W since she still has chest pressure, Per RN on 2W's request.

## 2016-04-23 NOTE — ED Notes (Signed)
MD at bedside. 

## 2016-04-23 NOTE — ED Provider Notes (Signed)
[facility] DEPT Provider Note   CSN: [id number] Arrival date & time: 04/22/16  2337  By signing my name below, I, Evelene Croon, attest that this documentation has been prepared under the direction and in the presence of Everlene Balls, MD . Electronically Signed: Evelene Croon, Scribe. 04/23/2016. 12:26 AM.  History   Chief Complaint Chief Complaint  Patient presents with  . Dizziness  . Loss of Consciousness     The history is provided by the patient. No language interpreter was used.     HPI Comments:  Kara Hanson is a 61 y.o. female brought in by ambulance, who presents to the Emergency Department complaining of intermittent dizziness x 2 days, worse today. Pt reports witnessed syncopal episode today that lasted 3-4s per pt's daughter. The episode occurred while watching a movie; no head injury. Pt reports associated pain to the RUE and RLE,  recent decrease in appetite and intermittent stabbing epigastric pain. She denies vomiting diarrhea, SOB, cough, fever, and sneezing.   Past Medical History:  Diagnosis Date  . Diabetes mellitus without complication ([place])   . Hypercholesteremia   . Hypertension   . Spinal stenosis     There are no active problems to display for this patient.   Past Surgical History:  Procedure Laterality Date  . BLADDER SURGERY    . CESAREAN SECTION    . CHOLECYSTECTOMY      OB History    No data available       Home Medications    Prior to Admission medications   Medication Sig Start Date End Date Taking? Authorizing Provider  aspirin EC 81 MG tablet Take 81 mg by mouth daily.    Historical Provider, MD  enalapril (VASOTEC) 10 MG tablet Take 1 tablet (10 mg total) by mouth daily. 01/25/16 02/24/16  Carmin Muskrat, MD  HYDROcodone-acetaminophen (NORCO/VICODIN) 5-325 MG tablet Take 1 tablet by mouth every 6 (six) hours as needed for moderate pain. 02/05/16   Jola Schmidt, MD  ibuprofen (ADVIL,MOTRIN) 200 MG tablet Take 600 mg by  mouth every 6 (six) hours as needed for headache, mild pain or moderate pain.    Historical Provider, MD  insulin aspart (NOVOLOG) 100 UNIT/ML injection Inject 10 Units into the skin 3 (three) times daily before meals.    Historical Provider, MD  insulin glargine (LANTUS) 100 UNIT/ML injection Inject 25 Units into the skin at bedtime.    Historical Provider, MD  pravastatin (PRAVACHOL) 40 MG tablet Take 40 mg by mouth every evening.    Historical Provider, MD    Family History History reviewed. No pertinent family history.  Social History Social History  Substance Use Topics  . Smoking status: Never Smoker  . Smokeless tobacco: Never Used  . Alcohol use No     Allergies   Latex and Morphine and related   Review of Systems Review of Systems  10 systems reviewed and all are negative for acute change except as noted in the HPI.   Physical Exam Updated Vital Signs BP 138/60 (BP Location: Right Arm)   Pulse 70   Temp 97.7 F (36.5 C) (Oral)   Resp 22   Ht 4\' 11"  (1.499 m)   SpO2 97%   Physical Exam  Constitutional: She is oriented to person, place, and time. She appears well-developed and well-nourished. No distress.  HENT:  Head: Normocephalic and atraumatic.  Nose: Nose normal.  Mouth/Throat: Oropharynx is clear and moist. No oropharyngeal exudate.  Eyes: Conjunctivae and EOM are normal. Pupils  are equal, round, and reactive to light. No scleral icterus.  Neck: Normal range of motion. Neck supple. No JVD present. No tracheal deviation present. No thyromegaly present.  Cardiovascular: Normal rate, regular rhythm and normal heart sounds.  Exam reveals no gallop and no friction rub.   No murmur heard. Pulmonary/Chest: Effort normal and breath sounds normal. No respiratory distress. She has no wheezes. She exhibits no tenderness.  Abdominal: Soft. Bowel sounds are normal. She exhibits no distension and no mass. There is no tenderness. There is no rebound and no guarding.    Musculoskeletal: Normal range of motion. She exhibits no edema or tenderness.  Lymphadenopathy:    She has no cervical adenopathy.  Neurological: She is alert and oriented to person, place, and time. No cranial nerve deficit. She exhibits normal muscle tone.  Normal strength and sensation to all extremities Normal cerebellar testing  Skin: Skin is warm and dry. No rash noted. No erythema. No pallor.  Nursing note and vitals reviewed.    ED Treatments / Results  DIAGNOSTIC STUDIES:  Oxygen Saturation is 99% on RA, normal by my interpretation.    COORDINATION OF CARE:  12:08 AM Discussed treatment plan with pt at bedside and pt agreed to plan.  Labs (all labs ordered are listed, but only abnormal results are displayed) Labs Reviewed  CBC WITH DIFFERENTIAL/PLATELET - Abnormal; Notable for the following:       Result Value   Hemoglobin 11.9 (*)    HCT 34.5 (*)    All other components within normal limits  COMPREHENSIVE METABOLIC PANEL - Abnormal; Notable for the following:    Potassium 3.3 (*)    Glucose, Bld 192 (*)    Calcium 8.6 (*)    Total Protein 5.4 (*)    Albumin 3.0 (*)    AST 14 (*)    Total Bilirubin 0.2 (*)    All other components within normal limits  MAGNESIUM - Abnormal; Notable for the following:    Magnesium 1.6 (*)    All other components within normal limits  LIPASE, BLOOD  TROPONIN I    EKG  EKG Interpretation  Date/Time:  Friday April 22 2016 23:48:48 EST Ventricular Rate:  80 PR Interval:    QRS Duration: 90 QT Interval:  355 QTC Calculation: 410 R Axis:   57 Text Interpretation:  Sinus rhythm Abnormal T, consider ischemia, diffuse leads TWI new since last tracing Confirmed by Glynn Octave [phone]) on 04/23/2016 12:40:02 AM       Radiology Dg Chest 2 View  Result Date: 04/23/2016 CLINICAL DATA:  61 y/o  F; syncope. EXAM: CHEST  2 VIEW COMPARISON:  01/25/2016 chest radiograph FINDINGS: The heart size and mediastinal contours  are within normal limits and stable given projection and technique. Mild degenerative changes of the spine. Status post cholecystectomy. IMPRESSION: No active cardiopulmonary disease. Electronically Signed   By: Kristine Garbe M.D.   On: 04/23/2016 01:24   Ct Head Wo Contrast  Result Date: 04/23/2016 CLINICAL DATA:  61 y/o  F; syncope and dizziness. EXAM: CT HEAD WITHOUT CONTRAST TECHNIQUE: Contiguous axial images were obtained from the base of the skull through the vertex without intravenous contrast. COMPARISON:  None. FINDINGS: Brain: No evidence of acute infarction, hemorrhage, hydrocephalus, extra-axial collection or mass lesion/mass effect. Few nonspecific foci of hypoattenuation in white matter compatible with mild chronic microvascular ischemic changes. Vascular: No hyperdense vessel. Calcific atherosclerosis of cavernous and paraclinoid internal carotid arteries. Skull: Normal. Negative for fracture or focal  lesion. Sinuses/Orbits: No acute finding. Right intra-ocular lens replacement. Other: None. IMPRESSION: 1. No acute intracranial abnormality identified. 2. Mild chronic microvascular ischemic changes of the brain. Electronically Signed   By: Kristine Garbe M.D.   On: 04/23/2016 01:41    Procedures Procedures (including critical care time)  Medications Ordered in ED Medications  magnesium sulfate IVPB 2 g 50 mL (not administered)  sodium chloride 0.9 % bolus 2,000 mL (2,000 mLs Intravenous New Bag/Given 04/23/16 0107)  ranitidine (ZANTAC) 150 MG/10ML syrup 300 mg (300 mg Oral Given 04/23/16 0107)  HYDROmorphone (DILAUDID) injection 0.5 mg (0.5 mg Intravenous Given 04/23/16 0107)     Initial Impression / Assessment and Plan / ED Course  I have reviewed the triage vital signs and the nursing notes.  Pertinent labs & imaging results that were available during my care of the patient were reviewed by me and considered in my medical decision making (see chart for  details).    Patient presents to the ED for for syncope and chest pain.  EKG shows new TWI, troponin is negative. Patient received ASA by EMS.  CT head negative and labs show low electrolytes, these were replaced.  Will admit to hospitalist for CP work up.  She was given ranitidine for possible GERD symptoms and dilaudid for her chronic neck pain.   Final Clinical Impressions(s) / ED Diagnoses   Final diagnoses:  None    New Prescriptions New Prescriptions   No medications on file      I personally performed the services described in this documentation, which was scribed in my presence. The recorded information has been reviewed and is accurate.       Everlene Balls, MD 04/23/16 [phone]

## 2016-04-23 NOTE — Discharge Instructions (Signed)

## 2016-04-23 NOTE — H&P (Signed)
History and Physical    Kara Hanson HQI:[id number] DOB: 1955-08-12 DOA: 04/22/2016  PCP: Kristine Garbe, MD  Patient coming from: Home.  Chief Complaint: Chest pain and syncope.  HPI: Kara Hanson is a 61 y.o. female with history of hypertension, hyperlipidemia, diabetes mellitus presents to the ER because of chest pain and syncope. Around 12 midnight tonight patient while watching TV with patient's daughter, patient suddenly lost consciousness. Prior to losing consciousness patient had dizzy spell. As per the daughter who witnessed the syncopal episode patient lost consciousness only a few seconds and begin immediately after patient was made to lie flat. Has not had any seizure-like episode tongue bite or incontinence of urine. Patient was not confused after the episode. Following which patient started developing chest pressure and left arm pain. Improved without any intervention after a few minutes. But chest pain recurred again in the ER. EKG shows diffuse T-wave changes. Chest pain improved with Dilaudid. Patient still has some chest tightness and shortness of breath. Patient had recently traveled to [place] during early part of this month.    ED Course: CT head is unremarkable. Chest x-ray shows nothing acute. EKG shows normal sinus rhythm with diffuse T-wave changes. QTC within acceptable limits.  Review of Systems: As per HPI, rest all negative.   Past Medical History:  Diagnosis Date  . Diabetes mellitus without complication ([place])   . Hypercholesteremia   . Hypertension   . Spinal stenosis     Past Surgical History:  Procedure Laterality Date  . BLADDER SURGERY    . CESAREAN SECTION    . CHOLECYSTECTOMY       reports that she has never smoked. She has never used smokeless tobacco. She reports that she does not drink alcohol or use drugs.  Allergies  Allergen Reactions  . Latex Itching  . Morphine And Related Itching    Headache     Family History  Problem  Relation Age of Onset  . Diabetes Mellitus II Father   . Stroke Father     Prior to Admission medications   Medication Sig Start Date End Date Taking? Authorizing Provider  aspirin EC 81 MG tablet Take 81 mg by mouth daily.   Yes Historical Provider, MD  HYDROcodone-acetaminophen (NORCO/VICODIN) 5-325 MG tablet Take 1 tablet by mouth every 6 (six) hours as needed for moderate pain. 02/05/16  Yes Jola Schmidt, MD  ibuprofen (ADVIL,MOTRIN) 200 MG tablet Take 600 mg by mouth every 6 (six) hours as needed for headache, mild pain or moderate pain.   Yes Historical Provider, MD  insulin aspart (NOVOLOG) 100 UNIT/ML injection Inject 10 Units into the skin 3 (three) times daily before meals.   Yes Historical Provider, MD  insulin glargine (LANTUS) 100 UNIT/ML injection Inject 25 Units into the skin at bedtime.   Yes Historical Provider, MD  pravastatin (PRAVACHOL) 40 MG tablet Take 40 mg by mouth every evening.   Yes Historical Provider, MD  enalapril (VASOTEC) 10 MG tablet Take 1 tablet (10 mg total) by mouth daily. 01/25/16 02/24/16  Carmin Muskrat, MD    Physical Exam: Vitals:   04/23/16 0330 04/23/16 0345 04/23/16 0400 04/23/16 0415  BP: 140/62 157/76 135/63 146/74  Pulse: 71 67 68 74  Resp: 21 16 17 19   Temp:      TempSrc:      SpO2: 94% 100% 96% 100%  Height:          Constitutional: Moderately built and nourished. Vitals:   04/23/16 0330  04/23/16 0345 04/23/16 0400 04/23/16 0415  BP: 140/62 157/76 135/63 146/74  Pulse: 71 67 68 74  Resp: 21 16 17 19   Temp:      TempSrc:      SpO2: 94% 100% 96% 100%  Height:       Eyes: Anicteric no pallor. ENMT: No discharge from the ears eyes nose or mouth. Neck: No mass felt. No JVD appreciated. Respiratory: No rhonchi or crepitations. Cardiovascular: S1 and S2 heard. No murmurs appreciated. Abdomen: Soft nontender bowel sounds present. No guarding or rigidity. Musculoskeletal: No edema. No joint effusion. Skin: No rash. Skin appears  warm. Neurologic: Alert awake oriented to time place and person. Moves all extremities. Psychiatric: Appears normal. Normal affect.   Labs on Admission: I have personally reviewed following labs and imaging studies  CBC:  Recent Labs Lab 04/23/16 0025  WBC 5.8  NEUTROABS 3.7  HGB 11.9*  HCT 34.5*  MCV 82.1  PLT 160   Basic Metabolic Panel:  Recent Labs Lab 04/23/16 0025  NA 135  K 3.3*  CL 105  CO2 22  GLUCOSE 192*  BUN 20  CREATININE 0.75  CALCIUM 8.6*  MG 1.6*   GFR: CrCl cannot be calculated (Unknown ideal weight.). Liver Function Tests:  Recent Labs Lab 04/23/16 0025  AST 14*  ALT 19  ALKPHOS 64  BILITOT 0.2*  PROT 5.4*  ALBUMIN 3.0*    Recent Labs Lab 04/23/16 0025  LIPASE 26   No results for input(s): AMMONIA in the last 168 hours. Coagulation Profile: No results for input(s): INR, PROTIME in the last 168 hours. Cardiac Enzymes:  Recent Labs Lab 04/23/16 0025  TROPONINI <0.03   BNP (last 3 results) No results for input(s): PROBNP in the last 8760 hours. HbA1C: No results for input(s): HGBA1C in the last 72 hours. CBG: No results for input(s): GLUCAP in the last 168 hours. Lipid Profile: No results for input(s): CHOL, HDL, LDLCALC, TRIG, CHOLHDL, LDLDIRECT in the last 72 hours. Thyroid Function Tests: No results for input(s): TSH, T4TOTAL, FREET4, T3FREE, THYROIDAB in the last 72 hours. Anemia Panel: No results for input(s): VITAMINB12, FOLATE, FERRITIN, TIBC, IRON, RETICCTPCT in the last 72 hours. Urine analysis:    Component Value Date/Time   COLORURINE YELLOW 10/28/2015 1419   APPEARANCEUR CLOUDY (A) 10/28/2015 1419   LABSPEC 1.035 (H) 10/28/2015 1419   PHURINE 5.5 10/28/2015 1419   GLUCOSEU >1000 (A) 10/28/2015 1419   HGBUR NEGATIVE 10/28/2015 1419   BILIRUBINUR NEGATIVE 10/28/2015 1419   KETONESUR NEGATIVE 10/28/2015 1419   PROTEINUR 100 (A) 10/28/2015 1419   NITRITE NEGATIVE 10/28/2015 1419   LEUKOCYTESUR SMALL (A)  10/28/2015 1419   Sepsis Labs: @LABRCNTIP (procalcitonin:4,lacticidven:4) )No results found for this or any previous visit (from the past 240 hour(s)).   Radiological Exams on Admission: Dg Chest 2 View  Result Date: 04/23/2016 CLINICAL DATA:  61 y/o  F; syncope. EXAM: CHEST  2 VIEW COMPARISON:  01/25/2016 chest radiograph FINDINGS: The heart size and mediastinal contours are within normal limits and stable given projection and technique. Mild degenerative changes of the spine. Status post cholecystectomy. IMPRESSION: No active cardiopulmonary disease. Electronically Signed   By: Kristine Garbe M.D.   On: 04/23/2016 01:24   Ct Head Wo Contrast  Result Date: 04/23/2016 CLINICAL DATA:  61 y/o  F; syncope and dizziness. EXAM: CT HEAD WITHOUT CONTRAST TECHNIQUE: Contiguous axial images were obtained from the base of the skull through the vertex without intravenous contrast. COMPARISON:  None. FINDINGS: Brain: No  evidence of acute infarction, hemorrhage, hydrocephalus, extra-axial collection or mass lesion/mass effect. Few nonspecific foci of hypoattenuation in white matter compatible with mild chronic microvascular ischemic changes. Vascular: No hyperdense vessel. Calcific atherosclerosis of cavernous and paraclinoid internal carotid arteries. Skull: Normal. Negative for fracture or focal lesion. Sinuses/Orbits: No acute finding. Right intra-ocular lens replacement. Other: None. IMPRESSION: 1. No acute intracranial abnormality identified. 2. Mild chronic microvascular ischemic changes of the brain. Electronically Signed   By: Kristine Garbe M.D.   On: 04/23/2016 01:41    EKG: Independently reviewed - Normal sinus rhythm with diffuse T-wave changes.  Assessment/Plan Principal Problem:   Chest pain Active Problems:   Controlled type 2 diabetes mellitus with hyperglycemia (HCC)   Hypertension   Normochromic normocytic anemia   Syncope    1. Chest pain - given that patient has  diffuse T-wave changes and risk factors including hypertension hyperlipidemia and diabetes we will cycle cardiac markers to rule out ACS. Since patient also has mild shortness of breath and had recent travel I have ordered CT angiogram of the chest to rule out PE. Check 2-D echo keep patient on aspirin nitroglycerin patch continue statins. Cardiology consult in a.m. 2. Syncope - monitor in telemetry for any arrhythmias. Check 2-D echo. Check orthostatics. Follow CT scan of the chest to rule out PE. 3. Diabetes mellitus type 2 - patient did take her insulin last night. If patient continues to be nothing by mouth may have to decrease her dose of long-acting insulin. Patient on sliding scale coverage. 4. Mild hypokalemia and hypomagnesemia - replace and recheck. 5. Hypertension - will continue home medications. 6. Hyperlipidemia on statins. 7. History of stroke as per the patient. 8. Patient also states she has had been admitted in [place] for chest pain 3 years ago. At that time patient had cardiac cath with no stents placed as per the patient.   DVT prophylaxis: Lovenox. Code Status: Full code.  Family Communication: Patient's daughter.  Disposition Plan: Home.  Consults called: Cardiology consult requested.  Admission status: Observation.    Rise Patience MD [facility] Pager [phone].  If 7PM-7AM, please contact night-coverage [email or web address] Password [email or web address]  04/23/2016, 4:20 AM

## 2016-04-23 NOTE — Consult Note (Addendum)
Admit date: 04/22/2016 Referring Physician  Dr. Doyle Askew Primary Physician Kristine Garbe, MD Primary Cardiologist  new Reason for Consultation  chest pain, abnormal EKG, syncope  HPI: 61 year old female with diabetes, hypertension, hyperlipidemia with no prior cardiac history admitted with sudden loss of consciousness witnessed at around midnight while watching television with her daughter. She felt dizziness all day long she states. This lasted a few seconds no seizure-like activity. She developed sharp chest discomfort following this episode with no radiation that improved after a few minutes. She did receive dilauded to help with chest discomfort. Once in the emergency department, she continued to have some chest tightness as well as shortness of breath. Given her recent travel history to [place], a CT scan of her chest was performed which demonstrated no evidence of pulmonary embolism. This did however show aortic atherosclerosis as well as coronary atherosclerosis in the form of calcifications.  Approximately 3 years ago she had cardiac catheterization in [place] with no stents placed per patient.  Her EKG did demonstrate T wave inversion inferior and laterally new.  Nonsmoker, no alcohol, no drug use    PMH:   Past Medical History:  Diagnosis Date  . Diabetes mellitus without complication ([place])   . Hypercholesteremia   . Hypertension   . Spinal stenosis     PSH:   Past Surgical History:  Procedure Laterality Date  . BLADDER SURGERY    . CESAREAN SECTION    . CHOLECYSTECTOMY     Allergies:  Latex and Morphine and related Prior to Admit Meds:   Prior to Admission medications   Medication Sig Start Date End Date Taking? Authorizing Provider  aspirin EC 81 MG tablet Take 81 mg by mouth daily.   Yes Historical Provider, MD  HYDROcodone-acetaminophen (NORCO/VICODIN) 5-325 MG tablet Take 1 tablet by mouth every 6 (six) hours as needed for moderate pain. 02/05/16  Yes Jola Schmidt, MD  ibuprofen (ADVIL,MOTRIN) 200 MG tablet Take 600 mg by mouth every 6 (six) hours as needed for headache, mild pain or moderate pain.   Yes Historical Provider, MD  insulin aspart (NOVOLOG) 100 UNIT/ML injection Inject 10 Units into the skin 3 (three) times daily before meals.   Yes Historical Provider, MD  insulin glargine (LANTUS) 100 UNIT/ML injection Inject 25 Units into the skin at bedtime.   Yes Historical Provider, MD  pravastatin (PRAVACHOL) 40 MG tablet Take 40 mg by mouth every evening.   Yes Historical Provider, MD  enalapril (VASOTEC) 10 MG tablet Take 1 tablet (10 mg total) by mouth daily. 01/25/16 02/24/16  Carmin Muskrat, MD   Fam HX:    Family History  Problem Relation Age of Onset  . Diabetes Mellitus II Father   . Stroke Father    Social HX:    Social History   Social History  . Marital status: Married    Spouse name: N/A  . Number of children: N/A  . Years of education: N/A   Occupational History  . Not on file.   Social History Main Topics  . Smoking status: Never Smoker  . Smokeless tobacco: Never Used  . Alcohol use No  . Drug use: No  . Sexual activity: Not Currently    Birth control/ protection: None   Other Topics Concern  . Not on file   Social History Narrative  . No narrative on file     ROS:  All 11 ROS were addressed and are negative except what is stated in  the HPI   Physical Exam: Blood pressure (!) 142/68, pulse 67, temperature 98.3 F (36.8 C), temperature source Oral, resp. rate 19, height 4\' 11"  (1.499 m), weight 168 lb 14.4 oz (76.6 kg), SpO2 93 %.   General: Short in stature Well developed, well nourished, in no acute distress Head: Eyes PERRLA, No xanthomas.   Normal cephalic and atramatic  Lungs:   Clear bilaterally to auscultation and percussion. Normal respiratory effort. No wheezes, no rales. Heart:   HRRR S1 S2 Pulses are 2+ & equal. No murmur, rubs, gallops.  No carotid bruit. No JVD.  No abdominal bruits.    Abdomen: Bowel sounds are positive, abdomen soft and non-tender without masses. No hepatosplenomegaly. Msk:  Back normal. Normal strength and tone for age. Extremities:  No clubbing, cyanosis or edema.  DP +1 Neuro: Alert and oriented X 3, non-focal, MAE x 4 GU: Deferred Rectal: Deferred Psych:  Good affect, responds appropriately      Labs: Lab Results  Component Value Date   WBC 5.2 04/23/2016   HGB 11.1 (L) 04/23/2016   HCT 32.8 (L) 04/23/2016   MCV 82.8 04/23/2016   PLT 180 04/23/2016     Recent Labs Lab 04/23/16 0025 04/23/16 0447  NA 135 133*  K 3.3* 4.0  CL 105 105  CO2 22 23  BUN 20 19  CREATININE 0.75 0.69  CALCIUM 8.6* 8.3*  PROT 5.4*  --   BILITOT 0.2*  --   ALKPHOS 64  --   ALT 19  --   AST 14*  --   GLUCOSE 192* 209*    Recent Labs  04/23/16 0025 04/23/16 0447  TROPONINI <0.03 <0.03   No results found for: CHOL, HDL, LDLCALC, TRIG No results found for: DDIMER   Radiology:  Dg Chest 2 View  Result Date: 04/23/2016 CLINICAL DATA:  61 y/o  F; syncope. EXAM: CHEST  2 VIEW COMPARISON:  01/25/2016 chest radiograph FINDINGS: The heart size and mediastinal contours are within normal limits and stable given projection and technique. Mild degenerative changes of the spine. Status post cholecystectomy. IMPRESSION: No active cardiopulmonary disease. Electronically Signed   By: Kristine Garbe M.D.   On: 04/23/2016 01:24   Ct Head Wo Contrast  Result Date: 04/23/2016 CLINICAL DATA:  61 y/o  F; syncope and dizziness. EXAM: CT HEAD WITHOUT CONTRAST TECHNIQUE: Contiguous axial images were obtained from the base of the skull through the vertex without intravenous contrast. COMPARISON:  None. FINDINGS: Brain: No evidence of acute infarction, hemorrhage, hydrocephalus, extra-axial collection or mass lesion/mass effect. Few nonspecific foci of hypoattenuation in white matter compatible with mild chronic microvascular ischemic changes. Vascular: No hyperdense  vessel. Calcific atherosclerosis of cavernous and paraclinoid internal carotid arteries. Skull: Normal. Negative for fracture or focal lesion. Sinuses/Orbits: No acute finding. Right intra-ocular lens replacement. Other: None. IMPRESSION: 1. No acute intracranial abnormality identified. 2. Mild chronic microvascular ischemic changes of the brain. Electronically Signed   By: Kristine Garbe M.D.   On: 04/23/2016 01:41   Ct Angio Chest Pe W Or Wo Contrast  Result Date: 04/23/2016 CLINICAL DATA:  Shortness of breath. EXAM: CT ANGIOGRAPHY CHEST WITH CONTRAST TECHNIQUE: Multidetector CT imaging of the chest was performed using the standard protocol during bolus administration of intravenous contrast. Multiplanar CT image reconstructions and MIPs were obtained to evaluate the vascular anatomy. CONTRAST:  100 cc Isovue 370 IV COMPARISON:  Radiographs earlier this day.  Chest CT 10/01/2015 FINDINGS: Cardiovascular: There are no filling defects within the pulmonary  arteries to suggest pulmonary embolus. No evidence of aortic dissection or aneurysm. Mild atherosclerosis. Coronary artery calcifications are seen. Heart is upper normal in size. Mediastinum/Nodes: No pericardial effusion. Calcified right infrahilar node. No noncalcified adenopathy. No evidence of thyroid nodule. The esophagus is decompressed. Lungs/Pleura: Heterogeneous attenuation in the perihilar and lower lobes with mild associated bronchial thickening. No focal consolidation or pneumonia. Minimal atelectasis in the right middle lobe. Calcified granuloma in the right lower lobe. No pleural fluid or pulmonary edema. Upper Abdomen: No acute abnormality. Musculoskeletal: There are no acute or suspicious osseous abnormalities. Mild degenerative change in the midthoracic spine. Review of the MIP images confirms the above findings. IMPRESSION: 1. No pulmonary embolus. 2. Heterogeneous attenuation in the perihilar and lower lobes with mild bronchial  thickening suggests small airways disease. 3. Mild thoracic aortic atherosclerosis. Coronary artery calcifications. Electronically Signed   By: Jeb Levering M.D.   On: 04/23/2016 04:51   Personally viewed.  EKG:  04/22/16-T wave inversion inferior lateral leads, sinus rhythm. These T-wave inversions are new when compared to prior EKG on 11/13/15. Personally viewed.   ASSESSMENT/PLAN:    61 year old with history of hypertension, diabetes, hyperlipidemia here with chest pain and syncope.  Chest pain  - Coronary artery calcification seen on CT scan  - Troponins are normal  - Original EKG does show new T-wave inversions in the inferior lateral leads.  - She had cardiac catheterization approximate 3 years ago with minor irregularities per patient. No stents placed.  - Her chest pain after the syncope episode is described as sharp, central/deep. No arm radiation.  - No recurrent chest discomfort.  - I think it be reasonable to discharge since she has had a fairly recent coronary evaluation. We will follow up in cardiology clinic.  Syncope  - She states that she felt dizzy all day long  - Perhaps she was intravascularly volume depleted  - She feels better with IV hydration.  - Still has a headache.  - Doubt seizure  - Nothing on telemetry.  - Echocardiogram seems reasonable. If normal, okay for discharge.  Diabetes  - Per primary team  Hypertension  - No changes.  Hyperlipidemia  - Continue statin use  History of stroke  - Secondary prevention. Per patient.  Candee Furbish, MD  04/23/2016  9:26 AM  Addendum: 2:34 PM  Echocardiogram personally reviewed demonstrates reduced ejection fraction of 30-35%. She does not recall being told this in the past. Remember she had cardiac catheterization 3 years ago. She states that this was done after an abnormal stress test.  I offered her 2 options, one option would be to go home and have a diagnostic cardiac catheterization set up as an  outpatient in the other option would be to continue to stay here in the hospital for further evaluation with diagnostic heart catheterization placed on the add on board for Monday. With her husband here in the hospital on 3 E., she would like to stay here in the hospital for further evaluation. This makes sense. This will also give [place] further opportunity to watch for any adverse arrhythmias.  We will set her up for cardiac catheterization. Risks and benefits including stroke, heart attack, death, renal impairment explained. She is willing to proceed.  Given reduced ejection fraction, I will start carvedilol 3.125 mg twice a day.  Main complaint is headache, likely from nitroglycerin patch. Spoke to nursing staff, they will contact Dr. Doyle Askew to let them know change of plans.  Candee Furbish, MD

## 2016-04-23 NOTE — Plan of Care (Signed)
Problem: Education: Goal: Knowledge of Kara Hanson General Education information/materials will improve Outcome: Progressing Discussed with patient and family about plan of care and stepdown verses telemetry with some teach back displayed

## 2016-04-24 DIAGNOSIS — I429 Cardiomyopathy, unspecified: Secondary | ICD-10-CM | POA: Diagnosis present

## 2016-04-24 DIAGNOSIS — I420 Dilated cardiomyopathy: Secondary | ICD-10-CM

## 2016-04-24 DIAGNOSIS — Z794 Long term (current) use of insulin: Secondary | ICD-10-CM | POA: Diagnosis not present

## 2016-04-24 DIAGNOSIS — E7800 Pure hypercholesterolemia, unspecified: Secondary | ICD-10-CM | POA: Diagnosis present

## 2016-04-24 DIAGNOSIS — R0602 Shortness of breath: Secondary | ICD-10-CM | POA: Diagnosis present

## 2016-04-24 DIAGNOSIS — E785 Hyperlipidemia, unspecified: Secondary | ICD-10-CM | POA: Diagnosis present

## 2016-04-24 DIAGNOSIS — E1165 Type 2 diabetes mellitus with hyperglycemia: Secondary | ICD-10-CM | POA: Diagnosis present

## 2016-04-24 DIAGNOSIS — Y92230 Patient room in hospital as the place of occurrence of the external cause: Secondary | ICD-10-CM | POA: Diagnosis not present

## 2016-04-24 DIAGNOSIS — R0789 Other chest pain: Secondary | ICD-10-CM | POA: Diagnosis not present

## 2016-04-24 DIAGNOSIS — E876 Hypokalemia: Secondary | ICD-10-CM | POA: Diagnosis present

## 2016-04-24 DIAGNOSIS — E1151 Type 2 diabetes mellitus with diabetic peripheral angiopathy without gangrene: Secondary | ICD-10-CM | POA: Diagnosis present

## 2016-04-24 DIAGNOSIS — Z885 Allergy status to narcotic agent status: Secondary | ICD-10-CM | POA: Diagnosis not present

## 2016-04-24 DIAGNOSIS — T463X5A Adverse effect of coronary vasodilators, initial encounter: Secondary | ICD-10-CM | POA: Diagnosis not present

## 2016-04-24 DIAGNOSIS — M4800 Spinal stenosis, site unspecified: Secondary | ICD-10-CM | POA: Diagnosis present

## 2016-04-24 DIAGNOSIS — I700 Atherosclerosis of aorta: Secondary | ICD-10-CM | POA: Diagnosis present

## 2016-04-24 DIAGNOSIS — I5181 Takotsubo syndrome: Secondary | ICD-10-CM | POA: Diagnosis present

## 2016-04-24 DIAGNOSIS — R079 Chest pain, unspecified: Secondary | ICD-10-CM | POA: Diagnosis present

## 2016-04-24 DIAGNOSIS — I25118 Atherosclerotic heart disease of native coronary artery with other forms of angina pectoris: Secondary | ICD-10-CM | POA: Diagnosis not present

## 2016-04-24 DIAGNOSIS — Z79899 Other long term (current) drug therapy: Secondary | ICD-10-CM | POA: Diagnosis not present

## 2016-04-24 DIAGNOSIS — E669 Obesity, unspecified: Secondary | ICD-10-CM | POA: Diagnosis present

## 2016-04-24 DIAGNOSIS — R42 Dizziness and giddiness: Secondary | ICD-10-CM | POA: Diagnosis present

## 2016-04-24 DIAGNOSIS — R51 Headache: Secondary | ICD-10-CM | POA: Diagnosis not present

## 2016-04-24 DIAGNOSIS — I10 Essential (primary) hypertension: Secondary | ICD-10-CM | POA: Diagnosis present

## 2016-04-24 DIAGNOSIS — Z91040 Latex allergy status: Secondary | ICD-10-CM | POA: Diagnosis not present

## 2016-04-24 DIAGNOSIS — Z79891 Long term (current) use of opiate analgesic: Secondary | ICD-10-CM | POA: Diagnosis not present

## 2016-04-24 DIAGNOSIS — Z7982 Long term (current) use of aspirin: Secondary | ICD-10-CM | POA: Diagnosis not present

## 2016-04-24 DIAGNOSIS — D6489 Other specified anemias: Secondary | ICD-10-CM | POA: Diagnosis present

## 2016-04-24 DIAGNOSIS — I519 Heart disease, unspecified: Secondary | ICD-10-CM | POA: Diagnosis not present

## 2016-04-24 DIAGNOSIS — R55 Syncope and collapse: Secondary | ICD-10-CM | POA: Diagnosis present

## 2016-04-24 LAB — CBC
HCT: 36.2 % (ref 36.0–46.0)
Hemoglobin: 12.1 g/dL (ref 12.0–15.0)
MCH: 27.8 pg (ref 26.0–34.0)
MCHC: 33.4 g/dL (ref 30.0–36.0)
MCV: 83 fL (ref 78.0–100.0)
PLATELETS: 220 10*3/uL (ref 150–400)
RBC: 4.36 MIL/uL (ref 3.87–5.11)
RDW: 13.1 % (ref 11.5–15.5)
WBC: 4.7 10*3/uL (ref 4.0–10.5)

## 2016-04-24 LAB — BASIC METABOLIC PANEL
Anion gap: 5 (ref 5–15)
BUN: 12 mg/dL (ref 6–20)
CO2: 26 mmol/L (ref 22–32)
CREATININE: 0.71 mg/dL (ref 0.44–1.00)
Calcium: 9.1 mg/dL (ref 8.9–10.3)
Chloride: 106 mmol/L (ref 101–111)
GFR calc Af Amer: >60 mL/min (ref >60)
GLUCOSE: 142 mg/dL — AB (ref 65–99)
Potassium: 3.8 mmol/L (ref 3.5–5.1)
SODIUM: 137 mmol/L (ref 135–145)

## 2016-04-24 LAB — GLUCOSE, CAPILLARY
GLUCOSE-CAPILLARY: 150 mg/dL — AB (ref 65–99)
GLUCOSE-CAPILLARY: 166 mg/dL — AB (ref 65–99)
Glucose-Capillary: 146 mg/dL — ABNORMAL HIGH (ref 65–99)
Glucose-Capillary: 277 mg/dL — ABNORMAL HIGH (ref 65–99)
Glucose-Capillary: 225 mg/dL — ABNORMAL HIGH (ref 65–99)
Glucose-Capillary: 262 mg/dL — ABNORMAL HIGH (ref 65–99)

## 2016-04-24 MED ORDER — CARVEDILOL 12.5 MG PO TABS
12.5000 mg | ORAL_TABLET | Freq: Two times a day (BID) | ORAL | Status: DC
  Administered 2016-04-24 – 2016-04-26 (×5): 12.5 mg via ORAL
  Filled 2016-04-24 (×5): qty 1

## 2016-04-24 NOTE — Progress Notes (Signed)
PROGRESS NOTE    Kara Hanson  CWC:[id number] DOB: Sep 14, 1955 DOA: 04/22/2016  PCP: Kristine Garbe, MD   Brief Narrative: 61 y.o. female with hypertension, hyperlipidemia, diabetes mellitus presents to the ER because of chest pain and syncope.   Assessment & Plan:   Principal Problem:   Chest pain - no chest pain this AM - ECHO with EF 35% - cardio on board, plan for cath on Monday  - keep on tele - NPO after midnight   Cardiomyopathy - Left heart cath tomorrow, EF 35% - cardiology increased coreg to 12.5 BID - added lisinopril 5mg  QD  Active Problems:   Controlled type 2 diabetes mellitus with hyperglycemia (HCC) - continue Lantus and SSI    Syncope - will need PT eval post cath     Hypomagnesemia - supplemented, Mg in AM    Obesity  - Body mass index is 33.06 kg/m.  DVT prophylaxis: Lovenox SQ Code Status: Full  Family Communication: Patient at bedside  Disposition Plan: To be determined after cath done   Consultants:   Cardiology   Procedures:   None  Antimicrobials:   None  Subjective: No events overnight.    Objective: Vitals:   04/24/16 1035 04/24/16 1138 04/24/16 1645 04/24/16 1733  BP: (!) 171/75 (!) 171/75 (!) 135/58 (!) 154/70  Pulse: 85 83 74 80  Resp:  20 18   Temp:  98.4 F (36.9 C) 98.5 F (36.9 C)   TempSrc:  Oral Oral   SpO2:  96% 97%   Weight:      Height:        Intake/Output Summary (Last 24 hours) at 04/24/16 1746 Last data filed at 04/24/16 1400  Gross per 24 hour  Intake              480 ml  Output                0 ml  Net              480 ml   Filed Weights   04/23/16 0538 04/24/16 0429  Weight: 76.6 kg (168 lb 14.4 oz) 74.3 kg (163 lb 11.2 oz)    Examination:  General exam: Appears calm and comfortable  Respiratory system: Clear to auscultation. Respiratory effort normal. Cardiovascular system: S1 & S2 heard, RRR. No JVD, rubs, gallops or clicks. No pedal edema. Gastrointestinal system: Abdomen is  nondistended, soft and nontender. No organomegaly or masses felt.  Central nervous system: Alert and oriented. No focal neurological deficits. Extremities: Symmetric 5 x 5 power.  Data Reviewed: I have personally reviewed following labs and imaging studies  CBC:  Recent Labs Lab 04/23/16 0025 04/23/16 0447 04/24/16 0353  WBC 5.8 5.2 4.7  NEUTROABS 3.7 3.1  --   HGB 11.9* 11.1* 12.1  HCT 34.5* 32.8* 36.2  MCV 82.1 82.8 83.0  PLT 218 180 761   Basic Metabolic Panel:  Recent Labs Lab 04/23/16 0025 04/23/16 0447 04/24/16 0353  NA 135 133* 137  K 3.3* 4.0 3.8  CL 105 105 106  CO2 22 23 26   GLUCOSE 192* 209* 142*  BUN 20 19 12   CREATININE 0.75 0.69 0.71  CALCIUM 8.6* 8.3* 9.1  MG 1.6*  --   --    Liver Function Tests:  Recent Labs Lab 04/23/16 0025  AST 14*  ALT 19  ALKPHOS 64  BILITOT 0.2*  PROT 5.4*  ALBUMIN 3.0*    Recent Labs Lab 04/23/16 0025  LIPASE 26  Cardiac Enzymes:  Recent Labs Lab 04/23/16 0025 04/23/16 0447 04/23/16 0959  TROPONINI <0.03 <0.03 <0.03   CBG:  Recent Labs Lab 04/23/16 2308 04/24/16 0428 04/24/16 0740 04/24/16 1134 04/24/16 1726  GLUCAP 142* 150* 146* 277* 225*   Thyroid Function Tests:  Recent Labs  04/23/16 0447  TSH 2.153   Urine analysis:    Component Value Date/Time   COLORURINE YELLOW 10/28/2015 1419   APPEARANCEUR CLOUDY (A) 10/28/2015 1419   LABSPEC 1.035 (H) 10/28/2015 1419   PHURINE 5.5 10/28/2015 1419   GLUCOSEU >1000 (A) 10/28/2015 1419   HGBUR NEGATIVE 10/28/2015 1419   BILIRUBINUR NEGATIVE 10/28/2015 1419   KETONESUR NEGATIVE 10/28/2015 1419   PROTEINUR 100 (A) 10/28/2015 1419   NITRITE NEGATIVE 10/28/2015 1419   LEUKOCYTESUR SMALL (A) 10/28/2015 1419   Recent Results (from the past 240 hour(s))  MRSA PCR Screening     Status: None   Collection Time: 04/23/16  5:42 AM  Result Value Ref Range Status   MRSA by PCR NEGATIVE NEGATIVE Final    Comment:        The GeneXpert MRSA Assay  (FDA approved for NASAL specimens only), is one component of a comprehensive MRSA colonization surveillance program. It is not intended to diagnose MRSA infection nor to guide or monitor treatment for MRSA infections.     Radiology Studies: Dg Chest 2 View  Result Date: 04/23/2016 CLINICAL DATA:  61 y/o  F; syncope. EXAM: CHEST  2 VIEW COMPARISON:  01/25/2016 chest radiograph FINDINGS: The heart size and mediastinal contours are within normal limits and stable given projection and technique. Mild degenerative changes of the spine. Status post cholecystectomy. IMPRESSION: No active cardiopulmonary disease. Electronically Signed   By: Kristine Garbe M.D.   On: 04/23/2016 01:24   Ct Head Wo Contrast  Result Date: 04/23/2016 CLINICAL DATA:  61 y/o  F; syncope and dizziness. EXAM: CT HEAD WITHOUT CONTRAST TECHNIQUE: Contiguous axial images were obtained from the base of the skull through the vertex without intravenous contrast. COMPARISON:  None. FINDINGS: Brain: No evidence of acute infarction, hemorrhage, hydrocephalus, extra-axial collection or mass lesion/mass effect. Few nonspecific foci of hypoattenuation in white matter compatible with mild chronic microvascular ischemic changes. Vascular: No hyperdense vessel. Calcific atherosclerosis of cavernous and paraclinoid internal carotid arteries. Skull: Normal. Negative for fracture or focal lesion. Sinuses/Orbits: No acute finding. Right intra-ocular lens replacement. Other: None. IMPRESSION: 1. No acute intracranial abnormality identified. 2. Mild chronic microvascular ischemic changes of the brain. Electronically Signed   By: Kristine Garbe M.D.   On: 04/23/2016 01:41   Ct Angio Chest Pe W Or Wo Contrast  Result Date: 04/23/2016 CLINICAL DATA:  Shortness of breath. EXAM: CT ANGIOGRAPHY CHEST WITH CONTRAST TECHNIQUE: Multidetector CT imaging of the chest was performed using the standard protocol during bolus administration of  intravenous contrast. Multiplanar CT image reconstructions and MIPs were obtained to evaluate the vascular anatomy. CONTRAST:  100 cc Isovue 370 IV COMPARISON:  Radiographs earlier this day.  Chest CT 10/01/2015 FINDINGS: Cardiovascular: There are no filling defects within the pulmonary arteries to suggest pulmonary embolus. No evidence of aortic dissection or aneurysm. Mild atherosclerosis. Coronary artery calcifications are seen. Heart is upper normal in size. Mediastinum/Nodes: No pericardial effusion. Calcified right infrahilar node. No noncalcified adenopathy. No evidence of thyroid nodule. The esophagus is decompressed. Lungs/Pleura: Heterogeneous attenuation in the perihilar and lower lobes with mild associated bronchial thickening. No focal consolidation or pneumonia. Minimal atelectasis in the right middle lobe. Calcified granuloma in  the right lower lobe. No pleural fluid or pulmonary edema. Upper Abdomen: No acute abnormality. Musculoskeletal: There are no acute or suspicious osseous abnormalities. Mild degenerative change in the midthoracic spine. Review of the MIP images confirms the above findings. IMPRESSION: 1. No pulmonary embolus. 2. Heterogeneous attenuation in the perihilar and lower lobes with mild bronchial thickening suggests small airways disease. 3. Mild thoracic aortic atherosclerosis. Coronary artery calcifications. Electronically Signed   By: Jeb Levering M.D.   On: 04/23/2016 04:51      Scheduled Meds: . aspirin EC  325 mg Oral Daily  . carvedilol  12.5 mg Oral BID WC  . enoxaparin (LOVENOX) injection  40 mg Subcutaneous Q24H  . insulin aspart  0-9 Units Subcutaneous Q4H  . insulin glargine  25 Units Subcutaneous QHS  . pravastatin  40 mg Oral QPM   Continuous Infusions:   LOS: 0 days   Time spent: 20 minutes   Faye Ramsay, MD [facility] Pager [phone]  If 7PM-7AM, please contact night-coverage [email or web address] Password [facility] 04/24/2016, 5:46  PM

## 2016-04-24 NOTE — Progress Notes (Addendum)
Progress Note  Patient Name: Kara Hanson Date of Encounter: 04/24/2016  Primary Cardiologist: New  Subjective   Kept her in house to perform cath Monday. No further CP. EF 35%. Burning in anterior/medial right leg over knee down leg, sensitive to touch, sometimes goes from abdomen down. Has had this before for several days. No edema.   Inpatient Medications    Scheduled Meds: . aspirin EC  325 mg Oral Daily  . carvedilol  12.5 mg Oral BID WC  . enoxaparin (LOVENOX) injection  40 mg Subcutaneous Q24H  . insulin aspart  0-9 Units Subcutaneous Q4H  . insulin glargine  25 Units Subcutaneous QHS  . pravastatin  40 mg Oral QPM   Continuous Infusions:  PRN Meds: acetaminophen, HYDROcodone-acetaminophen, ondansetron (ZOFRAN) IV, simethicone   Vital Signs    Vitals:   04/23/16 2000 04/23/16 2310 04/24/16 0429 04/24/16 0742  BP: (!) 149/70 (!) 157/61 (!) 159/72 (!) 167/68  Pulse:  77 75 79  Resp:  18 16 18   Temp:  98.1 F (36.7 C) 98.4 F (36.9 C) 98.2 F (36.8 C)  TempSrc:  Oral Oral Oral  SpO2:  98% 98% 95%  Weight:   163 lb 11.2 oz (74.3 kg)   Height:        Intake/Output Summary (Last 24 hours) at 04/24/16 0904 Last data filed at 04/23/16 1844  Gross per 24 hour  Intake              610 ml  Output                0 ml  Net              610 ml   Filed Weights   04/23/16 0538 04/24/16 0429  Weight: 168 lb 14.4 oz (76.6 kg) 163 lb 11.2 oz (74.3 kg)    Telemetry    NSR - Personally Reviewed  ECG    NSR, TWI anterior - Personally Reviewed  Physical Exam   GEN: No acute distress.  Neck: No JVD Cardiac: RRR, no murmurs, rubs, or gallops.  Respiratory: Clear to auscultation bilaterally. GI: Soft, nontender, non-distended  MS: No edema; No deformity. Neuro:  AAOx3. Psych: Normal affect  Labs    Chemistry Recent Labs Lab 04/23/16 0025 04/23/16 0447 04/24/16 0353  NA 135 133* 137  K 3.3* 4.0 3.8  CL 105 105 106  CO2 22 23 26   GLUCOSE 192*  209* 142*  BUN 20 19 12   CREATININE 0.75 0.69 0.71  CALCIUM 8.6* 8.3* 9.1  PROT 5.4*  --   --   ALBUMIN 3.0*  --   --   AST 14*  --   --   ALT 19  --   --   ALKPHOS 64  --   --   BILITOT 0.2*  --   --   GFRNONAA >60 >60 >60  GFRAA >60 >60 >60  ANIONGAP 8 5 5      Hematology Recent Labs Lab 04/23/16 0025 04/23/16 0447 04/24/16 0353  WBC 5.8 5.2 4.7  RBC 4.20 3.96 4.36  HGB 11.9* 11.1* 12.1  HCT 34.5* 32.8* 36.2  MCV 82.1 82.8 83.0  MCH 28.3 28.0 27.8  MCHC 34.5 33.8 33.4  RDW 13.1 13.1 13.1  PLT 218 180 220    Cardiac Enzymes Recent Labs Lab 04/23/16 0025 04/23/16 0447 04/23/16 0959  TROPONINI <0.03 <0.03 <0.03   No results for input(s): TROPIPOC in the last 168 hours.   BNPNo results  for input(s): BNP, PROBNP in the last 168 hours.   DDimer No results for input(s): DDIMER in the last 168 hours.   Radiology    Dg Chest 2 View  Result Date: 04/23/2016 CLINICAL DATA:  61 y/o  F; syncope. EXAM: CHEST  2 VIEW COMPARISON:  01/25/2016 chest radiograph FINDINGS: The heart size and mediastinal contours are within normal limits and stable given projection and technique. Mild degenerative changes of the spine. Status post cholecystectomy. IMPRESSION: No active cardiopulmonary disease. Electronically Signed   By: Kristine Garbe M.D.   On: 04/23/2016 01:24   Ct Head Wo Contrast  Result Date: 04/23/2016 CLINICAL DATA:  61 y/o  F; syncope and dizziness. EXAM: CT HEAD WITHOUT CONTRAST TECHNIQUE: Contiguous axial images were obtained from the base of the skull through the vertex without intravenous contrast. COMPARISON:  None. FINDINGS: Brain: No evidence of acute infarction, hemorrhage, hydrocephalus, extra-axial collection or mass lesion/mass effect. Few nonspecific foci of hypoattenuation in white matter compatible with mild chronic microvascular ischemic changes. Vascular: No hyperdense vessel. Calcific atherosclerosis of cavernous and paraclinoid internal carotid  arteries. Skull: Normal. Negative for fracture or focal lesion. Sinuses/Orbits: No acute finding. Right intra-ocular lens replacement. Other: None. IMPRESSION: 1. No acute intracranial abnormality identified. 2. Mild chronic microvascular ischemic changes of the brain. Electronically Signed   By: Kristine Garbe M.D.   On: 04/23/2016 01:41   Ct Angio Chest Pe W Or Wo Contrast  Result Date: 04/23/2016 CLINICAL DATA:  Shortness of breath. EXAM: CT ANGIOGRAPHY CHEST WITH CONTRAST TECHNIQUE: Multidetector CT imaging of the chest was performed using the standard protocol during bolus administration of intravenous contrast. Multiplanar CT image reconstructions and MIPs were obtained to evaluate the vascular anatomy. CONTRAST:  100 cc Isovue 370 IV COMPARISON:  Radiographs earlier this day.  Chest CT 10/01/2015 FINDINGS: Cardiovascular: There are no filling defects within the pulmonary arteries to suggest pulmonary embolus. No evidence of aortic dissection or aneurysm. Mild atherosclerosis. Coronary artery calcifications are seen. Heart is upper normal in size. Mediastinum/Nodes: No pericardial effusion. Calcified right infrahilar node. No noncalcified adenopathy. No evidence of thyroid nodule. The esophagus is decompressed. Lungs/Pleura: Heterogeneous attenuation in the perihilar and lower lobes with mild associated bronchial thickening. No focal consolidation or pneumonia. Minimal atelectasis in the right middle lobe. Calcified granuloma in the right lower lobe. No pleural fluid or pulmonary edema. Upper Abdomen: No acute abnormality. Musculoskeletal: There are no acute or suspicious osseous abnormalities. Mild degenerative change in the midthoracic spine. Review of the MIP images confirms the above findings. IMPRESSION: 1. No pulmonary embolus. 2. Heterogeneous attenuation in the perihilar and lower lobes with mild bronchial thickening suggests small airways disease. 3. Mild thoracic aortic atherosclerosis.  Coronary artery calcifications. Electronically Signed   By: Jeb Levering M.D.   On: 04/23/2016 04:51    Cardiac Studies   ECHO EF 35%  Patient Profile     61 y.o. female with newly discovered cardiomyopathy EF 35% with brief episode of syncope at home and TWI V2-3 on ECG  Assessment & Plan    Cardiomyopathy  - Left heart cath tomorrow, EF 35%  - ? Viral vs ischemic  - Increased coreg to 12.5 BID  - Will add lisinopril 5mg  QD  - Repeat echo in 3 months  - Does not appear volume overloaded.   Chest pain  - ? Ischemic with TWI however quite atypical.  - Cath. No further pain  Syncope  - dizzy all day leading up to event. ?vol  depleted at the time  - No adverse rhythms on tele here.  - cath tomorrow  Coronary calcifications  - cath  Leg pain  - ?neuropathic. Not in distribution for DVT.    Signed, Candee Furbish, MD  04/24/2016, 9:04 AM

## 2016-04-25 ENCOUNTER — Inpatient Hospital Stay (HOSPITAL_COMMUNITY): Payer: Medicaid Other

## 2016-04-25 ENCOUNTER — Encounter (HOSPITAL_COMMUNITY)
Admission: EM | Disposition: A | Discharge status: Home / Self Care | Payer: Self-pay | Source: Home / Self Care | Attending: Internal Medicine

## 2016-04-25 DIAGNOSIS — R079 Chest pain, unspecified: Principal | ICD-10-CM

## 2016-04-25 DIAGNOSIS — I519 Heart disease, unspecified: Secondary | ICD-10-CM

## 2016-04-25 DIAGNOSIS — I25118 Atherosclerotic heart disease of native coronary artery with other forms of angina pectoris: Secondary | ICD-10-CM

## 2016-04-25 HISTORY — PX: CARDIAC CATHETERIZATION: SHX172

## 2016-04-25 LAB — GLUCOSE, CAPILLARY
GLUCOSE-CAPILLARY: 116 mg/dL — AB (ref 65–99)
GLUCOSE-CAPILLARY: 162 mg/dL — AB (ref 65–99)
GLUCOSE-CAPILLARY: 80 mg/dL (ref 65–99)
GLUCOSE-CAPILLARY: 223 mg/dL — AB (ref 65–99)
Glucose-Capillary: 103 mg/dL — ABNORMAL HIGH (ref 65–99)
Glucose-Capillary: 125 mg/dL — ABNORMAL HIGH (ref 65–99)
Glucose-Capillary: 119 mg/dL — ABNORMAL HIGH (ref 65–99)
Glucose-Capillary: 84 mg/dL (ref 65–99)

## 2016-04-25 LAB — CBC
HEMATOCRIT: 35.6 % — AB (ref 36.0–46.0)
HEMOGLOBIN: 11.9 g/dL — AB (ref 12.0–15.0)
MCH: 27.8 pg (ref 26.0–34.0)
MCHC: 33.4 g/dL (ref 30.0–36.0)
MCV: 83.2 fL (ref 78.0–100.0)
Platelets: 211 10*3/uL (ref 150–400)
RBC: 4.28 MIL/uL (ref 3.87–5.11)
RDW: 13.3 % (ref 11.5–15.5)
WBC: 4.6 10*3/uL (ref 4.0–10.5)

## 2016-04-25 LAB — PROTIME-INR
INR: 1.04
Prothrombin Time: 13.6 seconds (ref 11.4–15.2)

## 2016-04-25 LAB — BASIC METABOLIC PANEL
Anion gap: 5 (ref 5–15)
BUN: 11 mg/dL (ref 6–20)
CHLORIDE: 105 mmol/L (ref 101–111)
CO2: 27 mmol/L (ref 22–32)
Calcium: 9.1 mg/dL (ref 8.9–10.3)
Creatinine, Ser: 0.78 mg/dL (ref 0.44–1.00)
GFR calc non Af Amer: >60 mL/min (ref >60)
Glucose, Bld: 96 mg/dL (ref 65–99)
POTASSIUM: 3.6 mmol/L (ref 3.5–5.1)
SODIUM: 137 mmol/L (ref 135–145)

## 2016-04-25 LAB — MAGNESIUM: Magnesium: 1.8 mg/dL (ref 1.7–2.4)

## 2016-04-25 SURGERY — LEFT HEART CATH AND CORONARY ANGIOGRAPHY
Anesthesia: LOCAL

## 2016-04-25 MED ORDER — ACETAMINOPHEN 325 MG PO TABS
650.0000 mg | ORAL_TABLET | ORAL | Status: DC | PRN
  Administered 2016-04-25 (×2): 650 mg via ORAL
  Filled 2016-04-25 (×2): qty 2

## 2016-04-25 MED ORDER — NITROGLYCERIN 1 MG/10 ML FOR IR/CATH LAB
INTRA_ARTERIAL | Status: AC
  Filled 2016-04-25: qty 10

## 2016-04-25 MED ORDER — IOPAMIDOL (ISOVUE-370) INJECTION 76%
INTRAVENOUS | Status: AC
  Filled 2016-04-25: qty 100

## 2016-04-25 MED ORDER — MIDAZOLAM HCL 2 MG/2ML IJ SOLN
INTRAMUSCULAR | Status: DC | PRN
  Administered 2016-04-25: 1 mg via INTRAVENOUS

## 2016-04-25 MED ORDER — HEPARIN (PORCINE) IN NACL 2-0.9 UNIT/ML-% IJ SOLN
INTRAMUSCULAR | Status: AC
  Filled 2016-04-25: qty 1000

## 2016-04-25 MED ORDER — VERAPAMIL HCL 2.5 MG/ML IV SOLN
INTRAVENOUS | Status: AC
  Filled 2016-04-25: qty 2

## 2016-04-25 MED ORDER — HEPARIN SODIUM (PORCINE) 1000 UNIT/ML IJ SOLN
INTRAMUSCULAR | Status: AC
  Filled 2016-04-25: qty 1

## 2016-04-25 MED ORDER — SODIUM CHLORIDE 0.9 % IV SOLN: INTRAVENOUS | Status: AC

## 2016-04-25 MED ORDER — DEXTROSE 50 % IV SOLN
INTRAVENOUS | Status: AC
  Administered 2016-04-25: 25 mL
  Filled 2016-04-25: qty 50

## 2016-04-25 MED ORDER — MIDAZOLAM HCL 2 MG/2ML IJ SOLN
INTRAMUSCULAR | Status: AC
  Filled 2016-04-25: qty 2

## 2016-04-25 MED ORDER — LIDOCAINE HCL (PF) 1 % IJ SOLN
INTRAMUSCULAR | Status: AC
  Filled 2016-04-25: qty 30

## 2016-04-25 MED ORDER — ONDANSETRON HCL 4 MG/2ML IJ SOLN: 4.0000 mg | Freq: Four times a day (QID) | INTRAMUSCULAR | Status: DC | PRN

## 2016-04-25 MED ORDER — CARVEDILOL 12.5 MG PO TABS: 12.5000 mg | ORAL_TABLET | Freq: Two times a day (BID) | ORAL | 0 refills | Status: DC

## 2016-04-25 MED ORDER — SODIUM CHLORIDE 0.9 % IV SOLN: 250.0000 mL | INTRAVENOUS | Status: DC | PRN

## 2016-04-25 MED ORDER — ASPIRIN 81 MG PO CHEW: 81.0000 mg | CHEWABLE_TABLET | ORAL | Status: DC

## 2016-04-25 MED ORDER — SODIUM CHLORIDE 0.9% FLUSH: 3.0000 mL | Freq: Two times a day (BID) | INTRAVENOUS | Status: DC

## 2016-04-25 MED ORDER — HEPARIN SODIUM (PORCINE) 1000 UNIT/ML IJ SOLN
INTRAMUSCULAR | Status: DC | PRN
  Administered 2016-04-25: 3500 [IU] via INTRAVENOUS

## 2016-04-25 MED ORDER — ASPIRIN 81 MG PO CHEW
81.0000 mg | CHEWABLE_TABLET | Freq: Every day | ORAL | Status: DC
  Administered 2016-04-26: 81 mg via ORAL
  Filled 2016-04-25: qty 1

## 2016-04-25 MED ORDER — IOPAMIDOL (ISOVUE-370) INJECTION 76%
INTRAVENOUS | Status: DC | PRN
  Administered 2016-04-25: 50 mL via INTRA_ARTERIAL

## 2016-04-25 MED ORDER — FENTANYL CITRATE (PF) 100 MCG/2ML IJ SOLN
INTRAMUSCULAR | Status: AC
  Filled 2016-04-25: qty 2

## 2016-04-25 MED ORDER — SODIUM CHLORIDE 0.9% FLUSH: 3.0000 mL | INTRAVENOUS | Status: DC | PRN

## 2016-04-25 MED ORDER — FENTANYL CITRATE (PF) 100 MCG/2ML IJ SOLN
INTRAMUSCULAR | Status: DC | PRN
  Administered 2016-04-25: 25 ug via INTRAVENOUS

## 2016-04-25 MED ORDER — SODIUM CHLORIDE 0.9 % IV SOLN
INTRAVENOUS | Status: DC
  Administered 2016-04-25: 12:00:00 via INTRAVENOUS

## 2016-04-25 MED ORDER — HEPARIN (PORCINE) IN NACL 2-0.9 UNIT/ML-% IJ SOLN
INTRAMUSCULAR | Status: DC | PRN
  Administered 2016-04-25: 1000 mL

## 2016-04-25 MED ORDER — VERAPAMIL HCL 2.5 MG/ML IV SOLN
INTRAVENOUS | Status: DC | PRN
  Administered 2016-04-25: 10 mL via INTRA_ARTERIAL

## 2016-04-25 SURGICAL SUPPLY — 12 items
CATH INFINITI 5FR ANG PIGTAIL (CATHETERS) ×2 IMPLANT
CATH OPTITORQUE TIG 4.0 5F (CATHETERS) ×2 IMPLANT
DEVICE RAD COMP TR BAND LRG (VASCULAR PRODUCTS) ×2 IMPLANT
GLIDESHEATH SLEND A-KIT 6F 22G (SHEATH) ×2 IMPLANT
GUIDEWIRE INQWIRE 1.5J.035X260 (WIRE) ×1 IMPLANT
INQWIRE 1.5J .035X260CM (WIRE) ×2
KIT HEART LEFT (KITS) ×2 IMPLANT
PACK CARDIAC CATHETERIZATION (CUSTOM PROCEDURE TRAY) ×2 IMPLANT
SYR MEDRAD MARK V 150ML (SYRINGE) ×2 IMPLANT
TRANSDUCER W/STOPCOCK (MISCELLANEOUS) ×2 IMPLANT
TUBING CIL FLEX 10 FLL-RA (TUBING) ×2 IMPLANT
WIRE HI TORQ VERSACORE-J 145CM (WIRE) ×2 IMPLANT

## 2016-04-25 NOTE — Interval H&P Note (Signed)
Cath Lab Visit (complete for each Cath Lab visit)  Clinical Evaluation Leading to the Procedure:   ACS: No.  Non-ACS:    Anginal Classification: CCS II  Anti-ischemic medical therapy: No Therapy  Non-Invasive Test Results: No non-invasive testing performed  Prior CABG: No previous CABG      History and Physical Interval Note:  04/25/2016 2:20 PM  Kara Hanson  has presented today for surgery, with the diagnosis of SOB  The various methods of treatment have been discussed with the patient and family. After consideration of risks, benefits and other options for treatment, the patient has consented to  Procedure(s): Left Heart Cath and Coronary Angiography (N/A) as a surgical intervention .  The patient's history has been reviewed, patient examined, no change in status, stable for surgery.  I have reviewed the patient's chart and labs.  Questions were answered to the patient's satisfaction.     [name]

## 2016-04-25 NOTE — Progress Notes (Signed)
Pt with some degree of confusion since she returned to the floor after her heart catheterization. Neurological examen with no deficit noted. Cardiology team was paged. Cardiologist requested for RN to call the primary attending physician. Attending MD ordered head CT. Rapid response was called as well. Pt CBG was 80, pt condition improved with the administration of  an amp of D50. Will continue to monitor closely. Repeat CBG =162 Ferdinand Lango, RN

## 2016-04-25 NOTE — Progress Notes (Addendum)
Progress Note  Patient Name: Kara Hanson Date of Encounter: 04/25/2016  Primary Cardiologist: Dr. Marlou Porch  Subjective   Currently asymptomatic. She states she had another episode of near syncope last night when getting up to use the bathroom. She was short of breath and dizzy. No syncope. No arrhthymias on telemetry.   Inpatient Medications    Scheduled Meds: . aspirin EC  325 mg Oral Daily  . carvedilol  12.5 mg Oral BID WC  . enoxaparin (LOVENOX) injection  40 mg Subcutaneous Q24H  . insulin aspart  0-9 Units Subcutaneous Q4H  . insulin glargine  25 Units Subcutaneous QHS  . pravastatin  40 mg Oral QPM   Continuous Infusions:  PRN Meds: acetaminophen, HYDROcodone-acetaminophen, ondansetron (ZOFRAN) IV, simethicone   Vital Signs    Vitals:   04/25/16 0132 04/25/16 0433 04/25/16 0746 04/25/16 0922  BP: (!) 142/64 (!) 164/57 (!) 151/60 (!) 148/66  Pulse: 71 71 73 73  Resp:  14 18   Temp: 98.3 F (36.8 C) 98.2 F (36.8 C) 98.1 F (36.7 C)   TempSrc: Oral Oral Oral   SpO2: 97% 99% 99%   Weight:  164 lb 11.2 oz (74.7 kg)    Height:        Intake/Output Summary (Last 24 hours) at 04/25/16 0937 Last data filed at 04/24/16 2043  Gross per 24 hour  Intake              480 ml  Output                0 ml  Net              480 ml   Filed Weights   04/23/16 0538 04/24/16 0429 04/25/16 0433  Weight: 168 lb 14.4 oz (76.6 kg) 163 lb 11.2 oz (74.3 kg) 164 lb 11.2 oz (74.7 kg)    Telemetry    NSR - Personally Reviewed   Physical Exam   GEN: No acute distress.  Neck: No JVD Cardiac: RRR, no murmurs, rubs, or gallops.  Respiratory: Clear to auscultation bilaterally. GI: Soft, nontender, non-distended  MS: No edema; No deformity. Neuro:  AAOx3. Psych: Normal affect  Labs    Chemistry Recent Labs Lab 04/23/16 0025 04/23/16 0447 04/24/16 0353 04/25/16 0358  NA 135 133* 137 137  K 3.3* 4.0 3.8 3.6  CL 105 105 106 105  CO2 22 23 26 27   GLUCOSE 192*  209* 142* 96  BUN 20 19 12 11   CREATININE 0.75 0.69 0.71 0.78  CALCIUM 8.6* 8.3* 9.1 9.1  PROT 5.4*  --   --   --   ALBUMIN 3.0*  --   --   --   AST 14*  --   --   --   ALT 19  --   --   --   ALKPHOS 64  --   --   --   BILITOT 0.2*  --   --   --   GFRNONAA >60 >60 >60 >60  GFRAA >60 >60 >60 >60  ANIONGAP 8 5 5 5      Hematology Recent Labs Lab 04/23/16 0447 04/24/16 0353 04/25/16 0358  WBC 5.2 4.7 4.6  RBC 3.96 4.36 4.28  HGB 11.1* 12.1 11.9*  HCT 32.8* 36.2 35.6*  MCV 82.8 83.0 83.2  MCH 28.0 27.8 27.8  MCHC 33.8 33.4 33.4  RDW 13.1 13.1 13.3  PLT 180 220 211    Cardiac Enzymes Recent Labs Lab 04/23/16 0025 04/23/16  0447 05-06-16 0959  TROPONINI <0.03 <0.03 <0.03   No results for input(s): TROPIPOC in the last 168 hours.   BNPNo results for input(s): BNP, PROBNP in the last 168 hours.   DDimer No results for input(s): DDIMER in the last 168 hours.   Radiology    No results found.  Cardiac Studies   LHC- pending   2D Echo 05/06/16  Study Conclusions  - Left ventricle: The cavity size was normal. Wall thickness was   normal. Systolic function was moderately to severely reduced. The   estimated ejection fraction was in the range of 30% to 35%. Wall   motion was normal; there were no regional wall motion   abnormalities. Doppler parameters are consistent with abnormal   left ventricular relaxation (grade 1 diastolic dysfunction). - Aortic valve: Trileaflet; mildly thickened, mildly calcified   leaflets. - Mitral valve: There was trivial regurgitation.   Patient Profile     61 y.o. female with newly discovered cardiomyopathy EF 35% with brief episode of syncope at home and TWI V2-3 on ECG  Assessment & Plan    1. Cardiomyopathy  - newly diagnosed.  EF 35%. ? Etiology  - Left heart cath today to r/o coronary ischemia  - continue medical therapy with BB. Add ACE/ARB post cath.   - Repeat echo in 3 months  - Does not appear volume overloaded.    2. Chest pain  - ? Ischemic with TWI however quite atypical.  - Plan definitive Cath today.   3. Syncope  - recurrent near syncope overnight but no frank syncope  - No adverse rhythms on tele   - cath today to r/o obstructive CAD  4. Coronary calcifications  - cath today   Signed, Lyda Jester, PA-C  04/25/2016, 9:37 AM    Personally seen and examined. Agree with above.  Cath today ?neurocardiogenic No adverse rhythms  Candee Furbish, MD   Addendum: Cardiac catheterization was reassuring  The left ventricular systolic function is normal.  LV end diastolic pressure is normal.  The left ventricular ejection fraction is 50-55% by visual estimate.  EF seems to have improved. Perhaps she had a degree of stress-induced cardiomyopathy?  If stable post catheterization, I'm comfortable with discharge.  Candee Furbish, MD

## 2016-04-25 NOTE — H&P (View-Only) (Signed)
Progress Note  Patient Name: Kara Hanson Date of Encounter: 04/25/2016  Primary Cardiologist: Dr. Marlou Porch  Subjective   Currently asymptomatic. She states she had another episode of near syncope last night when getting up to use the bathroom. She was short of breath and dizzy. No syncope. No arrhthymias on telemetry.   Inpatient Medications    Scheduled Meds: . aspirin EC  325 mg Oral Daily  . carvedilol  12.5 mg Oral BID WC  . enoxaparin (LOVENOX) injection  40 mg Subcutaneous Q24H  . insulin aspart  0-9 Units Subcutaneous Q4H  . insulin glargine  25 Units Subcutaneous QHS  . pravastatin  40 mg Oral QPM   Continuous Infusions:  PRN Meds: acetaminophen, HYDROcodone-acetaminophen, ondansetron (ZOFRAN) IV, simethicone   Vital Signs    Vitals:   04/25/16 0132 04/25/16 0433 04/25/16 0746 04/25/16 0922  BP: (!) 142/64 (!) 164/57 (!) 151/60 (!) 148/66  Pulse: 71 71 73 73  Resp:  14 18   Temp: 98.3 F (36.8 C) 98.2 F (36.8 C) 98.1 F (36.7 C)   TempSrc: Oral Oral Oral   SpO2: 97% 99% 99%   Weight:  164 lb 11.2 oz (74.7 kg)    Height:        Intake/Output Summary (Last 24 hours) at 04/25/16 0937 Last data filed at 04/24/16 2043  Gross per 24 hour  Intake              480 ml  Output                0 ml  Net              480 ml   Filed Weights   04/23/16 0538 04/24/16 0429 04/25/16 0433  Weight: 168 lb 14.4 oz (76.6 kg) 163 lb 11.2 oz (74.3 kg) 164 lb 11.2 oz (74.7 kg)    Telemetry    NSR - Personally Reviewed   Physical Exam   GEN: No acute distress.  Neck: No JVD Cardiac: RRR, no murmurs, rubs, or gallops.  Respiratory: Clear to auscultation bilaterally. GI: Soft, nontender, non-distended  MS: No edema; No deformity. Neuro:  AAOx3. Psych: Normal affect  Labs    Chemistry Recent Labs Lab 04/23/16 0025 04/23/16 0447 04/24/16 0353 04/25/16 0358  NA 135 133* 137 137  K 3.3* 4.0 3.8 3.6  CL 105 105 106 105  CO2 22 23 26 27   GLUCOSE 192*  209* 142* 96  BUN 20 19 12 11   CREATININE 0.75 0.69 0.71 0.78  CALCIUM 8.6* 8.3* 9.1 9.1  PROT 5.4*  --   --   --   ALBUMIN 3.0*  --   --   --   AST 14*  --   --   --   ALT 19  --   --   --   ALKPHOS 64  --   --   --   BILITOT 0.2*  --   --   --   GFRNONAA >60 >60 >60 >60  GFRAA >60 >60 >60 >60  ANIONGAP 8 5 5 5      Hematology Recent Labs Lab 04/23/16 0447 04/24/16 0353 04/25/16 0358  WBC 5.2 4.7 4.6  RBC 3.96 4.36 4.28  HGB 11.1* 12.1 11.9*  HCT 32.8* 36.2 35.6*  MCV 82.8 83.0 83.2  MCH 28.0 27.8 27.8  MCHC 33.8 33.4 33.4  RDW 13.1 13.1 13.3  PLT 180 220 211    Cardiac Enzymes Recent Labs Lab 04/23/16 0025 04/23/16  0447 19-May-2016 0959  TROPONINI <0.03 <0.03 <0.03   No results for input(s): TROPIPOC in the last 168 hours.   BNPNo results for input(s): BNP, PROBNP in the last 168 hours.   DDimer No results for input(s): DDIMER in the last 168 hours.   Radiology    No results found.  Cardiac Studies   LHC- pending   2D Echo 2016/05/19  Study Conclusions  - Left ventricle: The cavity size was normal. Wall thickness was   normal. Systolic function was moderately to severely reduced. The   estimated ejection fraction was in the range of 30% to 35%. Wall   motion was normal; there were no regional wall motion   abnormalities. Doppler parameters are consistent with abnormal   left ventricular relaxation (grade 1 diastolic dysfunction). - Aortic valve: Trileaflet; mildly thickened, mildly calcified   leaflets. - Mitral valve: There was trivial regurgitation.   Patient Profile     61 y.o. female with newly discovered cardiomyopathy EF 35% with brief episode of syncope at home and TWI V2-3 on ECG  Assessment & Plan    1. Cardiomyopathy  - newly diagnosed.  EF 35%. ? Etiology  - Left heart cath today to r/o coronary ischemia  - continue medical therapy with BB. Add ACE/ARB post cath.   - Repeat echo in 3 months  - Does not appear volume overloaded.    2. Chest pain  - ? Ischemic with TWI however quite atypical.  - Plan definitive Cath today.   3. Syncope  - recurrent near syncope overnight but no frank syncope  - No adverse rhythms on tele   - cath today to r/o obstructive CAD  4. Coronary calcifications  - cath today   Signed, Lyda Jester, PA-C  04/25/2016, 9:37 AM    Personally seen and examined. Agree with above.  Cath today ?neurocardiogenic No adverse rhythms  Candee Furbish, MD

## 2016-04-25 NOTE — Progress Notes (Addendum)
PROGRESS NOTE    Kara Hanson  PJA:[id number] DOB: 1956/02/01 DOA: 04/22/2016  PCP: Kristine Garbe, MD   Brief Narrative: 61 y.o. female with hypertension, hyperlipidemia, diabetes mellitus presents to the ER because of chest pain and syncope.   Assessment & Plan:   Principal Problem:   Chest pain - no chest pain this AM but has had near syncopal event last night  - ECHO with EF 35% - cardio on board, plan for cath today  - keep on tele - further recommendations pending cath   Cardiomyopathy - Left heart cath tomorrow, EF 35% - cardiology increased coreg to 12.5 BID - added lisinopril 5mg  QD  Active Problems:   Controlled type 2 diabetes mellitus with hyperglycemia ([place]) - continue Lantus and SSI    Syncope - will need PT eval post cath  - further recommendations pending cath results     Hypomagnesemia - supplemented, Mg in AM    Obesity  - Body mass index is 33.06 kg/m.  DVT prophylaxis: Lovenox SQ Code Status: Full  Family Communication: Patient at bedside  Disposition Plan: To be determined after cath done   Consultants:   Cardiology   Procedures:   None  Antimicrobials:   None  Subjective: Near syncopal event last night but no concerns this AM.   Objective: Vitals:   04/25/16 0433 04/25/16 0746 04/25/16 0922 04/25/16 1120  BP: (!) 164/57 (!) 151/60 (!) 148/66 121/66  Pulse: 71 73 73 71  Resp: 14 18  18   Temp: 98.2 F (36.8 C) 98.1 F (36.7 C)  98 F (36.7 C)  TempSrc: Oral Oral  Oral  SpO2: 99% 99%  95%  Weight: 74.7 kg (164 lb 11.2 oz)     Height:        Intake/Output Summary (Last 24 hours) at 04/25/16 1212 Last data filed at 04/24/16 2043  Gross per 24 hour  Intake              480 ml  Output                0 ml  Net              480 ml   Filed Weights   04/23/16 0538 04/24/16 0429 04/25/16 0433  Weight: 76.6 kg (168 lb 14.4 oz) 74.3 kg (163 lb 11.2 oz) 74.7 kg (164 lb 11.2 oz)    Examination:  General exam: Appears  calm and comfortable  Respiratory system: Clear to auscultation. Respiratory effort normal. Cardiovascular system: S1 & S2 heard, RRR. No JVD, rubs, gallops or clicks. No pedal edema. Gastrointestinal system: Abdomen is nondistended, soft and nontender. No organomegaly or masses felt.  Central nervous system: Alert and oriented. No focal neurological deficits. Extremities: Symmetric 5 x 5 power.  Data Reviewed: I have personally reviewed following labs and imaging studies  CBC:  Recent Labs Lab 04/23/16 0025 04/23/16 0447 04/24/16 0353 04/25/16 0358  WBC 5.8 5.2 4.7 4.6  NEUTROABS 3.7 3.1  --   --   HGB 11.9* 11.1* 12.1 11.9*  HCT 34.5* 32.8* 36.2 35.6*  MCV 82.1 82.8 83.0 83.2  PLT 218 180 220 341   Basic Metabolic Panel:  Recent Labs Lab 04/23/16 0025 04/23/16 0447 04/24/16 0353 04/25/16 0358  NA 135 133* 137 137  K 3.3* 4.0 3.8 3.6  CL 105 105 106 105  CO2 22 23 26 27   GLUCOSE 192* 209* 142* 96  BUN 20 19 12 11   CREATININE 0.75 0.69  0.71 0.78  CALCIUM 8.6* 8.3* 9.1 9.1  MG 1.6*  --   --  1.8   Liver Function Tests:  Recent Labs Lab 04/23/16 0025  AST 14*  ALT 19  ALKPHOS 64  BILITOT 0.2*  PROT 5.4*  ALBUMIN 3.0*    Recent Labs Lab 04/23/16 0025  LIPASE 26   Cardiac Enzymes:  Recent Labs Lab 04/23/16 0025 04/23/16 0447 04/23/16 0959  TROPONINI <0.03 <0.03 <0.03   CBG:  Recent Labs Lab 04/24/16 2254 04/25/16 0122 04/25/16 0432 04/25/16 0743 04/25/16 1126  GLUCAP 166* 116* 103* 125* 119*   Thyroid Function Tests:  Recent Labs  04/23/16 0447  TSH 2.153   Urine analysis:    Component Value Date/Time   COLORURINE YELLOW 10/28/2015 1419   APPEARANCEUR CLOUDY (A) 10/28/2015 1419   LABSPEC 1.035 (H) 10/28/2015 1419   PHURINE 5.5 10/28/2015 1419   GLUCOSEU >1000 (A) 10/28/2015 1419   HGBUR NEGATIVE 10/28/2015 1419   BILIRUBINUR NEGATIVE 10/28/2015 1419   KETONESUR NEGATIVE 10/28/2015 1419   PROTEINUR 100 (A) 10/28/2015 1419    NITRITE NEGATIVE 10/28/2015 1419   LEUKOCYTESUR SMALL (A) 10/28/2015 1419   Recent Results (from the past 240 hour(s))  MRSA PCR Screening     Status: None   Collection Time: 04/23/16  5:42 AM  Result Value Ref Range Status   MRSA by PCR NEGATIVE NEGATIVE Final    Comment:        The GeneXpert MRSA Assay (FDA approved for NASAL specimens only), is one component of a comprehensive MRSA colonization surveillance program. It is not intended to diagnose MRSA infection nor to guide or monitor treatment for MRSA infections.     Radiology Studies: No results found.    Scheduled Meds: . aspirin  81 mg Oral Pre-Cath  . aspirin EC  325 mg Oral Daily  . carvedilol  12.5 mg Oral BID WC  . enoxaparin (LOVENOX) injection  40 mg Subcutaneous Q24H  . insulin aspart  0-9 Units Subcutaneous Q4H  . insulin glargine  25 Units Subcutaneous QHS  . pravastatin  40 mg Oral QPM  . sodium chloride flush  3 mL Intravenous Q12H   Continuous Infusions: . sodium chloride 100 mL/hr at 04/25/16 1205     LOS: 1 day   Time spent: 20 minutes   Faye Ramsay, MD [facility] Pager [phone]  If 7PM-7AM, please contact night-coverage [email or web address] Password [email or web address] 04/25/2016, 12:12 PM

## 2016-04-25 NOTE — Progress Notes (Signed)
PT Cancellation Note  Patient Details Name: Kara Hanson MRN: [id number] DOB: [birth year]   Cancelled Treatment:    Reason Eval/Treat Not Completed: Patient not medically ready.  MD notes indicate PT eval post cath.  Will hold PT eval at this time and f/u as appropriate.     [place], [place] 04/25/2016, 1:27 PM

## 2016-04-25 NOTE — Significant Event (Addendum)
Rapid Response Event Note  Overview:  Called to assist with patient with confusion Time Called: 1754 Arrival Time: 1800 Event Type: Neurologic  Initial Focused Assessment:  On arrival she is alert - slightly confused but much better per staff - had given 1/2 amp D50 PTA - CBG was 80 - but daughter reports she is typically symptomatic if less than 200 - has been NPO for cath today - CBG's 100 - 80.  No focal neuro deficits -  Now knows name - place - family answering questions approp - follows commands.  Skin warm and dry.  SR on monitor.  BP 156/68 HR 71 RR 16 O2 sats 99% on RA.  No chest pain - or SOB.     Interventions:  Instructed staff to given 1/2 amp D50 prior to my arrival.  Neuro exam done.  Dr. Johnsie Cancel and Dr. Charlies Silvers contacted - order for stat head CT.  Handoff to Field seismologist.  To call as needed.    Plan of Care (if not transferred):  Monitor confusion - watch for\CT results - call as needed  Event Summary: Name of Physician Notified: Dr. Johnsie Cancel at  (PTA RRT)    at    Outcome: Stayed in room and stabalized  Event End Time: 1815  Quin Hoop

## 2016-04-26 ENCOUNTER — Encounter (HOSPITAL_COMMUNITY): Payer: Self-pay | Admitting: Cardiovascular Disease

## 2016-04-26 LAB — CBC
HCT: 35.4 % — ABNORMAL LOW (ref 36.0–46.0)
Hemoglobin: 11.9 g/dL — ABNORMAL LOW (ref 12.0–15.0)
MCH: 28.1 pg (ref 26.0–34.0)
MCHC: 33.6 g/dL (ref 30.0–36.0)
MCV: 83.5 fL (ref 78.0–100.0)
PLATELETS: 208 10*3/uL (ref 150–400)
RBC: 4.24 MIL/uL (ref 3.87–5.11)
RDW: 13.2 % (ref 11.5–15.5)
WBC: 4.2 10*3/uL (ref 4.0–10.5)

## 2016-04-26 LAB — BASIC METABOLIC PANEL
Anion gap: 7 (ref 5–15)
BUN: 11 mg/dL (ref 6–20)
CO2: 25 mmol/L (ref 22–32)
CREATININE: 0.81 mg/dL (ref 0.44–1.00)
Calcium: 9.1 mg/dL (ref 8.9–10.3)
Chloride: 102 mmol/L (ref 101–111)
GFR calc Af Amer: >60 mL/min (ref >60)
Glucose, Bld: 257 mg/dL — ABNORMAL HIGH (ref 65–99)
POTASSIUM: 3.8 mmol/L (ref 3.5–5.1)
Sodium: 134 mmol/L — ABNORMAL LOW (ref 135–145)

## 2016-04-26 LAB — GLUCOSE, CAPILLARY
Glucose-Capillary: 215 mg/dL — ABNORMAL HIGH (ref 65–99)
Glucose-Capillary: 188 mg/dL — ABNORMAL HIGH (ref 65–99)
Glucose-Capillary: 297 mg/dL — ABNORMAL HIGH (ref 65–99)

## 2016-04-26 MED ORDER — INSULIN GLARGINE 100 UNIT/ML ~~LOC~~ SOLN: 25.0000 [IU] | Freq: Every day | SUBCUTANEOUS | 1 refills | Status: DC

## 2016-04-26 MED ORDER — LOSARTAN POTASSIUM 25 MG PO TABS
25.0000 mg | ORAL_TABLET | Freq: Every day | ORAL | Status: DC
  Administered 2016-04-26: 25 mg via ORAL
  Filled 2016-04-26: qty 1

## 2016-04-26 MED ORDER — LOSARTAN POTASSIUM 25 MG PO TABS: 25.0000 mg | ORAL_TABLET | Freq: Every day | ORAL | 0 refills | Status: DC

## 2016-04-26 MED ORDER — CARVEDILOL 12.5 MG PO TABS: 12.5000 mg | ORAL_TABLET | Freq: Two times a day (BID) | ORAL | 0 refills | Status: DC

## 2016-04-26 MED FILL — Nitroglycerin IV Soln 100 MCG/ML in D5W: INTRA_ARTERIAL | Qty: 10 | Status: AC

## 2016-04-26 MED FILL — Lidocaine HCl Local Preservative Free (PF) Inj 1%: INTRAMUSCULAR | Qty: 30 | Status: AC

## 2016-04-26 NOTE — Discharge Summary (Signed)
Physician Discharge Summary  Kara Hanson FGH:[id number] DOB: Jun 16, 1955 DOA: 04/22/2016  PCP: Kara Garbe, MD  Admit date: 04/22/2016 Discharge date: 04/26/2016  Recommendations for Outpatient Follow-up:  1. Pt will need to follow up with PCP in 2-3 weeks post discharge 2. Please obtain BMP to evaluate electrolytes and kidney function 3. Please also check CBC to evaluate Hg and Hct levels  Discharge Diagnoses:  Principal Problem:   Chest pain Active Problems:   Controlled type 2 diabetes mellitus with hyperglycemia ([facility])   Hypertension   Normochromic normocytic anemia   Syncope   LV dysfunction  Discharge Condition: Stable  Diet recommendation: Heart healthy diet discussed in details   Brief Narrative: 61 y.o.femalewith hypertension, hyperlipidemia, diabetes mellitus presents to the ER because of chest pain and syncope.   Assessment & Plan:   Principal Problem:   Chest pain - no chest pain this AM but has had near syncopal event last night  - ECHO with EF 35% - cardio on board, cath done,m EF 50% - pt with no further chest pain, wants to go home, cardiology cleared for discharge   Cardiomyopathy - Left heart cath done, EF 50% - cardiology increased coreg to 12.5 BID - cardiology team cleared for discharge   Active Problems:   Controlled type 2 diabetes mellitus with hyperglycemia ([place]) - continue home medical regimen     Syncope - resolved     Hypomagnesemia - supplemented    Obesity  - Body mass index is 33.06 kg/m.  DVT prophylaxis: Lovenox SQ Code Status: Full  Family Communication: Patient at bedside  Disposition Plan: Home  Consultants:   Cardiology   Procedures/Studies: Dg Chest 2 View  Result Date: 04/23/2016 CLINICAL DATA:  61 y/o  F; syncope. EXAM: CHEST  2 VIEW COMPARISON:  01/25/2016 chest radiograph FINDINGS: The heart size and mediastinal contours are within normal limits and stable given projection and technique. Mild  degenerative changes of the spine. Status post cholecystectomy. IMPRESSION: No active cardiopulmonary disease. Electronically Signed   By: Kara Hanson M.D.   On: 04/23/2016 01:24   Ct Head Wo Contrast  Result Date: 04/25/2016 CLINICAL DATA:  Acute onset of confusion.  Initial encounter. EXAM: CT HEAD WITHOUT CONTRAST TECHNIQUE: Contiguous axial images were obtained from the base of the skull through the vertex without intravenous contrast. COMPARISON:  CT of the head performed 04/23/2016 FINDINGS: Brain: No evidence of acute infarction, hemorrhage, hydrocephalus, extra-axial collection or mass lesion/mass effect. Scattered periventricular and subcortical white matter change likely reflects small vessel ischemic microangiopathy. The posterior fossa, including the cerebellum, brainstem and fourth ventricle, is within normal limits. The third and lateral ventricles, and basal ganglia are unremarkable in appearance. The cerebral hemispheres are symmetric in appearance, with normal gray-white differentiation. No mass effect or midline shift is seen. Vascular: No hyperdense vessel or unexpected calcification. Skull: There is no evidence of fracture; visualized osseous structures are unremarkable in appearance. Sinuses/Orbits: The orbits are within normal limits. The paranasal sinuses and mastoid air cells are well-aerated. Other: No significant soft tissue abnormalities are seen. IMPRESSION: 1. No acute intracranial pathology seen on CT. 2. Scattered small vessel ischemic microangiopathy. Electronically Signed   By: Kara Hanson M.D.   On: 04/25/2016 21:34   Ct Head Wo Contrast  Result Date: 04/23/2016 CLINICAL DATA:  61 y/o  F; syncope and dizziness. EXAM: CT HEAD WITHOUT CONTRAST TECHNIQUE: Contiguous axial images were obtained from the base of the skull through the vertex without intravenous contrast. COMPARISON:  None.  FINDINGS: Brain: No evidence of acute infarction, hemorrhage, hydrocephalus,  extra-axial collection or mass lesion/mass effect. Few nonspecific foci of hypoattenuation in white matter compatible with mild chronic microvascular ischemic changes. Vascular: No hyperdense vessel. Calcific atherosclerosis of cavernous and paraclinoid internal carotid arteries. Skull: Normal. Negative for fracture or focal lesion. Sinuses/Orbits: No acute finding. Right intra-ocular lens replacement. Other: None. IMPRESSION: 1. No acute intracranial abnormality identified. 2. Mild chronic microvascular ischemic changes of the brain. Electronically Signed   By: Kara Hanson M.D.   On: 04/23/2016 01:41   Ct Angio Chest Pe W Or Wo Contrast  Result Date: 04/23/2016 CLINICAL DATA:  Shortness of breath. EXAM: CT ANGIOGRAPHY CHEST WITH CONTRAST TECHNIQUE: Multidetector CT imaging of the chest was performed using the standard protocol during bolus administration of intravenous contrast. Multiplanar CT image reconstructions and MIPs were obtained to evaluate the vascular anatomy. CONTRAST:  100 cc Isovue 370 IV COMPARISON:  Radiographs earlier this day.  Chest CT 10/01/2015 FINDINGS: Cardiovascular: There are no filling defects within the pulmonary arteries to suggest pulmonary embolus. No evidence of aortic dissection or aneurysm. Mild atherosclerosis. Coronary artery calcifications are seen. Heart is upper normal in size. Mediastinum/Nodes: No pericardial effusion. Calcified right infrahilar node. No noncalcified adenopathy. No evidence of thyroid nodule. The esophagus is decompressed. Lungs/Pleura: Heterogeneous attenuation in the perihilar and lower lobes with mild associated bronchial thickening. No focal consolidation or pneumonia. Minimal atelectasis in the right middle lobe. Calcified granuloma in the right lower lobe. No pleural fluid or pulmonary edema. Upper Abdomen: No acute abnormality. Musculoskeletal: There are no acute or suspicious osseous abnormalities. Mild degenerative change in the  midthoracic spine. Review of the MIP images confirms the above findings. IMPRESSION: 1. No pulmonary embolus. 2. Heterogeneous attenuation in the perihilar and lower lobes with mild bronchial thickening suggests small airways disease. 3. Mild thoracic aortic atherosclerosis. Coronary artery calcifications. Electronically Signed   By: Jeb Levering M.D.   On: 04/23/2016 04:51    Discharge Exam: Vitals:   04/26/16 0434 04/26/16 0848  BP: (!) 144/63 (!) 164/70  Pulse: 66   Resp: 20   Temp: 98.3 F (36.8 C) 98 F (36.7 C)   Vitals:   04/25/16 1507 04/25/16 1832 04/26/16 0434 04/26/16 0848  BP: (!) 150/57 (!) 156/68 (!) 144/63 (!) 164/70  Pulse:  71 66   Resp:   20   Temp: 97.8 F (36.6 C)  98.3 F (36.8 C) 98 F (36.7 C)  TempSrc: Oral   Oral  SpO2:   96% 95%  Weight:      Height:        General: Pt is alert, follows commands appropriately, not in acute distress Cardiovascular: Regular rate and rhythm, S1/S2 +, no murmurs, no rubs, no gallops Respiratory: Clear to auscultation bilaterally, no wheezing, no crackles, no rhonchi Abdominal: Soft, non tender, non distended, bowel sounds +, no guarding Extremities: no edema, no cyanosis, pulses palpable bilaterally DP and PT Neuro: Grossly nonfocal  Discharge Instructions  Discharge Instructions    Diet - low sodium heart healthy    Complete by:  As directed    Diet - low sodium heart healthy    Complete by:  As directed    Increase activity slowly    Complete by:  As directed    Increase activity slowly    Complete by:  As directed      Allergies as of 04/26/2016      Reactions   Latex Itching   Morphine And  Related Itching   Headache      Medication List    TAKE these medications   aspirin EC 81 MG tablet Take 81 mg by mouth daily.   carvedilol 12.5 MG tablet Commonly known as:  COREG Take 1 tablet (12.5 mg total) by mouth 2 (two) times daily with a meal.   enalapril 10 MG tablet Commonly known as:   VASOTEC Take 1 tablet (10 mg total) by mouth daily.   HYDROcodone-acetaminophen 5-325 MG tablet Commonly known as:  NORCO/VICODIN Take 1 tablet by mouth every 6 (six) hours as needed for moderate pain.   ibuprofen 200 MG tablet Commonly known as:  ADVIL,MOTRIN Take 600 mg by mouth every 6 (six) hours as needed for headache, mild pain or moderate pain.   insulin aspart 100 UNIT/ML injection Commonly known as:  novoLOG Inject 10 Units into the skin 3 (three) times daily before meals.   insulin glargine 100 UNIT/ML injection Commonly known as:  LANTUS Inject 25 Units into the skin at bedtime.   losartan 25 MG tablet Commonly known as:  COZAAR Take 1 tablet (25 mg total) by mouth daily.   pravastatin 40 MG tablet Commonly known as:  PRAVACHOL Take 40 mg by mouth every evening.      Follow-up Information    REESE,BETTI D, MD Follow up.   Specialty:  Family Medicine Contact information: [address] [place] [address] [phone]        Faye Ramsay, MD Follow up.   Specialty:  Internal Medicine Why:  call my cell phone with questions [phone] Contact information: [address] [place] [address] [phone]            The results of significant diagnostics from this hospitalization (including imaging, microbiology, ancillary and laboratory) are listed below for reference.     Microbiology: Recent Results (from the past 240 hour(s))  MRSA PCR Screening     Status: None   Collection Time: 04/23/16  5:42 AM  Result Value Ref Range Status   MRSA by PCR NEGATIVE NEGATIVE Final    Comment:        The GeneXpert MRSA Assay (FDA approved for NASAL specimens only), is one component of a comprehensive MRSA colonization surveillance program. It is not intended to diagnose MRSA infection nor to guide or monitor treatment for MRSA infections.      Labs: Basic Metabolic Panel:  Recent Labs Lab  04/23/16 0025 04/23/16 0447 04/24/16 0353 04/25/16 0358 04/26/16 0516  NA 135 133* 137 137 134*  K 3.3* 4.0 3.8 3.6 3.8  CL 105 105 106 105 102  CO2 22 23 26 27 25   GLUCOSE 192* 209* 142* 96 257*  BUN 20 19 12 11 11   CREATININE 0.75 0.69 0.71 0.78 0.81  CALCIUM 8.6* 8.3* 9.1 9.1 9.1  MG 1.6*  --   --  1.8  --    Liver Function Tests:  Recent Labs Lab 04/23/16 0025  AST 14*  ALT 19  ALKPHOS 64  BILITOT 0.2*  PROT 5.4*  ALBUMIN 3.0*    Recent Labs Lab 04/23/16 0025  LIPASE 26   No results for input(s): AMMONIA in the last 168 hours. CBC:  Recent Labs Lab 04/23/16 0025 04/23/16 0447 04/24/16 0353 04/25/16 0358 04/26/16 0516  WBC 5.8 5.2 4.7 4.6 4.2  NEUTROABS 3.7 3.1  --   --   --   HGB 11.9* 11.1* 12.1 11.9* 11.9*  HCT 34.5* 32.8* 36.2  35.6* 35.4*  MCV 82.1 82.8 83.0 83.2 83.5  PLT 218 180 220 211 208   Cardiac Enzymes:  Recent Labs Lab 04/23/16 0025 04/23/16 0447 04/23/16 0959  TROPONINI <0.03 <0.03 <0.03   BNP: BNP (last 3 results)  Recent Labs  10/01/15 1329  BNP 15.3   CBG:  Recent Labs Lab 04/25/16 1645 04/25/16 1750 04/25/16 1819 04/25/16 2137 04/26/16 0741  GLUCAP 84 80 162* 223* 215*     SIGNED: Time coordinating discharge: 30 minutes  MAGICK-MYERS, ISKRA, MD  [facility] 04/26/2016, 10:39 AM Pager [phone]  If 7PM-7AM, please contact night-coverage [email or web address] Password [email or web address]

## 2016-04-26 NOTE — Evaluation (Signed)
Physical Therapy Evaluation Patient Details Name: Kara Hanson MRN: [id number] DOB: [birth year] Today's Date: 04/26/2016   History of Present Illness  pt presents after syncopal episode.  pt with hx of HTN and DM.    Clinical Impression  Pt moving well and per pt report indicates that she is back to baseline level of mobility.  Pt does have a tremor at baseline which pt indicates that current tremor is normal for her.  No acute PT needs at this time.  Will sign off.      Follow Up Recommendations No PT follow up;Supervision - Intermittent    Equipment Recommendations  None recommended by PT    Recommendations for Other Services       Precautions / Restrictions Precautions Precautions: None Restrictions Weight Bearing Restrictions: No      Mobility  Bed Mobility               General bed mobility comments: pt sitting EOB on arrival.    Transfers Overall transfer level: Modified independent Equipment used: None                Ambulation/Gait Ambulation/Gait assistance: Modified independent (Device/Increase time) Ambulation Distance (Feet): 250 Feet Assistive device: None Gait Pattern/deviations: Step-through pattern;Decreased stride length     General Gait Details: pt moves slowly indicating this is the first time she has walked outside of her room.  pt is able to perform changes in speed and negotiate around obstacles without difficulty.    Stairs            Wheelchair Mobility    Modified Rankin (Stroke Patients Only)       Balance Overall balance assessment: Modified Independent                               Standardized Balance Assessment Standardized Balance Assessment : Dynamic Gait Index   Dynamic Gait Index Level Surface: Normal Change in Gait Speed: Mild Impairment Gait with Horizontal Head Turns: Normal Gait with Vertical Head Turns: Normal Gait and Pivot Turn: Normal Step Over Obstacle: Normal Step Around  Obstacles: Normal       Pertinent Vitals/Pain Pain Assessment: No/denies pain    Home Living Family/patient expects to be discharged to:: Private residence Living Arrangements: Spouse/significant other;Children Available Help at Discharge: Family;Available PRN/intermittently Type of Home: House Home Access: Stairs to enter Entrance Stairs-Rails: None Entrance Stairs-Number of Steps: 2 Home Layout: One level Home Equipment: None      Prior Function Level of Independence: Independent               Hand Dominance        Extremity/Trunk Assessment   Upper Extremity Assessment Upper Extremity Assessment: Overall WFL for tasks assessed    Lower Extremity Assessment Lower Extremity Assessment: Overall WFL for tasks assessed    Cervical / Trunk Assessment Cervical / Trunk Assessment: Normal  Communication   Communication: No difficulties  Cognition Arousal/Alertness: Awake/alert Behavior During Therapy: WFL for tasks assessed/performed Overall Cognitive Status: Within Functional Limits for tasks assessed                      General Comments      Exercises     Assessment/Plan    PT Assessment Patent does not need any further PT services  PT Problem List            PT Treatment Interventions  PT Goals (Current goals can be found in the Care Plan section)  Acute Rehab PT Goals Patient Stated Goal: Find out why I passed out. PT Goal Formulation: All assessment and education complete, DC therapy    Frequency     Barriers to discharge        Co-evaluation               End of Session Equipment Utilized During Treatment: Gait belt Activity Tolerance: Patient tolerated treatment well Patient left: in bed;with call bell/phone within reach;with chair alarm set Nurse Communication: Mobility status         Time: 0569-7948 PT Time Calculation (min) (ACUTE ONLY): 16 min   Charges:   PT Evaluation $PT Eval Low Complexity: 1  Procedure     PT G CodesThornton Papas Zaiden Ludlum, PT  [phone] 04/26/2016, 10:51 AM

## 2016-04-26 NOTE — Progress Notes (Signed)
Progress Note  Patient Name: Kara Hanson Date of Encounter: 04/26/2016  Primary Cardiologist: Dr. Marlou Porch  Subjective   Currently asymptomatic. Post cath felt strange. Head CT normal (small vessel changes). Neuro non focal. After D50 felt better. Now normal. "When can I go home"  Inpatient Medications    Scheduled Meds: . aspirin  81 mg Oral Daily  . carvedilol  12.5 mg Oral BID WC  . enoxaparin (LOVENOX) injection  40 mg Subcutaneous Q24H  . insulin aspart  0-9 Units Subcutaneous Q4H  . insulin glargine  25 Units Subcutaneous QHS  . pravastatin  40 mg Oral QPM  . sodium chloride flush  3 mL Intravenous Q12H   Continuous Infusions:  PRN Meds: sodium chloride, acetaminophen, HYDROcodone-acetaminophen, ondansetron (ZOFRAN) IV, simethicone, sodium chloride flush   Vital Signs    Vitals:   04/25/16 1507 04/25/16 1832 04/26/16 0434 04/26/16 0848  BP: (!) 150/57 (!) 156/68 (!) 144/63 (!) 164/70  Pulse:  71 66   Resp:   20   Temp: 97.8 F (36.6 C)  98.3 F (36.8 C) 98 F (36.7 C)  TempSrc: Oral   Oral  SpO2:   96% 95%  Weight:      Height:       No intake or output data in the 24 hours ending 04/26/16 0954 Filed Weights   04/23/16 0538 04/24/16 0429 04/25/16 0433  Weight: 168 lb 14.4 oz (76.6 kg) 163 lb 11.2 oz (74.3 kg) 164 lb 11.2 oz (74.7 kg)    Telemetry    NSR with brief episode of PAT noted around 10pm - Personally Reviewed   Physical Exam   GEN: No acute distress.  Neck: No JVD Cardiac: RRR, no murmurs, rubs, or gallops.  Respiratory: Clear to auscultation bilaterally. GI: Soft, nontender, non-distended  MS: No edema; No deformity. Neuro:  AAOx3. Psych: Normal affect  Labs    Chemistry Recent Labs Lab 04/23/16 0025  04/24/16 0353 04/25/16 0358 04/26/16 0516  NA 135  < > 137 137 134*  K 3.3*  < > 3.8 3.6 3.8  CL 105  < > 106 105 102  CO2 22  < > 26 27 25   GLUCOSE 192*  < > 142* 96 257*  BUN 20  < > 12 11 11   CREATININE 0.75  < >  0.71 0.78 0.81  CALCIUM 8.6*  < > 9.1 9.1 9.1  PROT 5.4*  --   --   --   --   ALBUMIN 3.0*  --   --   --   --   AST 14*  --   --   --   --   ALT 19  --   --   --   --   ALKPHOS 64  --   --   --   --   BILITOT 0.2*  --   --   --   --   GFRNONAA >60  < > >60 >60 >60  GFRAA >60  < > >60 >60 >60  ANIONGAP 8  < > 5 5 7   < > = values in this interval not displayed.   Hematology  Recent Labs Lab 04/24/16 0353 04/25/16 0358 04/26/16 0516  WBC 4.7 4.6 4.2  RBC 4.36 4.28 4.24  HGB 12.1 11.9* 11.9*  HCT 36.2 35.6* 35.4*  MCV 83.0 83.2 83.5  MCH 27.8 27.8 28.1  MCHC 33.4 33.4 33.6  RDW 13.1 13.3 13.2  PLT 220 211 208    Cardiac  Enzymes  Recent Labs Lab 04/23/16 0025 04/23/16 0447 04/23/16 0959  TROPONINI <0.03 <0.03 <0.03   No results for input(s): TROPIPOC in the last 168 hours.   BNPNo results for input(s): BNP, PROBNP in the last 168 hours.   DDimer No results for input(s): DDIMER in the last 168 hours.   Radiology    Ct Head Wo Contrast  Result Date: 04/25/2016 CLINICAL DATA:  Acute onset of confusion.  Initial encounter. EXAM: CT HEAD WITHOUT CONTRAST TECHNIQUE: Contiguous axial images were obtained from the base of the skull through the vertex without intravenous contrast. COMPARISON:  CT of the head performed 04/23/2016 FINDINGS: Brain: No evidence of acute infarction, hemorrhage, hydrocephalus, extra-axial collection or mass lesion/mass effect. Scattered periventricular and subcortical white matter change likely reflects small vessel ischemic microangiopathy. The posterior fossa, including the cerebellum, brainstem and fourth ventricle, is within normal limits. The third and lateral ventricles, and basal ganglia are unremarkable in appearance. The cerebral hemispheres are symmetric in appearance, with normal gray-white differentiation. No mass effect or midline shift is seen. Vascular: No hyperdense vessel or unexpected calcification. Skull: There is no evidence of  fracture; visualized osseous structures are unremarkable in appearance. Sinuses/Orbits: The orbits are within normal limits. The paranasal sinuses and mastoid air cells are well-aerated. Other: No significant soft tissue abnormalities are seen. IMPRESSION: 1. No acute intracranial pathology seen on CT. 2. Scattered small vessel ischemic microangiopathy. Electronically Signed   By: Garald Balding M.D.   On: 04/25/2016 21:34    Cardiac Studies   LHC- no CAD, EF 50% (improved)   2D Echo 04/23/16  Study Conclusions  - Left ventricle: The cavity size was normal. Wall thickness was   normal. Systolic function was moderately to severely reduced. The   estimated ejection fraction was in the range of 30% to 35%. Wall   motion was normal; there were no regional wall motion   abnormalities. Doppler parameters are consistent with abnormal   left ventricular relaxation (grade 1 diastolic dysfunction). - Aortic valve: Trileaflet; mildly thickened, mildly calcified   leaflets. - Mitral valve: There was trivial regurgitation.   Patient Profile     61 y.o. female with newly discovered cardiomyopathy EF 35% with brief episode of syncope at home and TWI V2-3 on ECG. Cath with no CAD, EF 50% improved.   Assessment & Plan    1. Cardiomyopathy  - newly diagnosed.  EF 35%. ? Etiology. Reassuringly, EF now 50% on cath with no CAD. ?Stress induced CM   - continue medical therapy with BB. Adding ACE/ARB, losartan 25 QD.    - Does not appear volume overloaded.   2. Chest pain  - TWI however quite atypical. ?stress induced cardiomyopathy. Cath reassuring.   3. Syncope  - recurrent episodes of dizziness at times  - No adverse rhythms on tele (brief episode of PAT noted around 10pm)  - ? Neurocardiogenic, psyc?  4. Coronary calcifications  - reassuring cath. No CAD.   - statin   5. Paroxsymal atrial tachycardia  - not reason for syncope. Continue Bb.   OK to go home. Will have follow up in  clinic.   Signed, Candee Furbish, MD  04/26/2016, 9:54 AM

## 2016-04-26 NOTE — Progress Notes (Signed)
Inpatient Diabetes Program Recommendations  AACE/ADA: New Consensus Statement on Inpatient Glycemic Control (2015)  Target Ranges:  Prepandial:   less than 140 mg/dL      Peak postprandial:   less than 180 mg/dL (1-2 hours)      Critically ill patients:  140 - 180 mg/dL   Results for Kara Hanson, Kara Hanson (MRN [id number]) as of 04/26/2016 10:34  Ref. Range 04/25/2016 21:37 04/26/2016 07:41  Glucose-Capillary Latest Ref Range: 65 - 99 mg/dL 223 (H) 215 (H)   Review of Glycemic Control  Diabetes history: DM 2 Outpatient Diabetes medications: Lantus 25 units, Novolog 10 units TID meal coverage Current orders for Inpatient glycemic control: Lantus 25 units, Novolog Sensitive Q4hours  Inpatient Diabetes Program Recommendations:   Patient takes meal coverage at home. Patient was NPO yesterday. Glucose now in the 200's when diet resumed. Consider ordering Novolog 3 units TID in addition to correction if 50% of meals are consumed.  Thanks,  Tama Headings RN, MSN, [facility] Inpatient Diabetes Coordinator Team Pager [phone] (8a-5p)

## 2016-04-28 ENCOUNTER — Telehealth: Payer: Self-pay

## 2016-04-28 NOTE — Telephone Encounter (Signed)
Transitional Care Clinic Post-discharge Follow-Up Phone Call:  Date of Discharge: 04/26/16 Principal Discharge Diagnosis(es): Chest pain Post-discharge Communication: This Case Manager received return call from patient. Patient indicated she is looking for a new PCP. Thoroughly discussed the case management and medical management services provided at the [facility] [place] with patient, and patient agreeble to follow-up with the [facility].  Call Completed: Yes                    With Whom: Patient     Please check all that apply:  X  Patient is knowledgeable of his/her condition(s) and/or treatment. X  Patient is caring for self at home.  ? Patient is receiving assist at home from family and/or caregiver. Family and/or caregiver is knowledgeable of patient's condition(s) and/or treatment. ? Patient is receiving home health services. If so, name of agency.     Medication Reconciliation:  ? Medication list reviewed with patient. X  Patient obtained all discharge medications-NO. Patient stated she has picked up carvedilol; however, she has not yet picked up Lantus or losartan. Patient indicated her daughter, [name], would be picking up Lantus and losartan for her today. Discussed importance of medication compliance, and patient verbalized understanding.   Activities of Daily Living: X  Independent ? Needs assist (describe; ? home DME used) ? Total Care (describe, ? home DME used)   Community resources in place for patient:  X  None  ? Home Health/Home DME ? Assisted Living ? Support Group         Questions/Concerns discussed: Discussed follow-up with the [facility], and patient agreeable. Appointment scheduled for 05/04/16 at 1400 with Dr. Jarold Song. Patient provided clinic address. Inquired about patient's status. Patient indicated she continues to have some "chest discomfort" and heaviness and is experiencing  shortness of breath. Patient indicated she is having difficulty catching her breath. Informed patient x2 she should get to ED as soon as possible for evaluation. Patient agreeable.  Assessment:       Home Environment:Patient lives with her family.       Support System: family members       Level of functioning: independent       Home DME: none       Home care services: none       Transportation: Patient indicated she typically takes the bus with her daughter to her medical appointments or someone drives her to her appointments.        Food/Nutrition: Patient indicated she has access to needed food/nutrition.        Medications: Patient gets her medications from [facility]. Patient denies problems obtaining needed medications.        PCP: Dr. Lin Landsman. Patient indicated she wants to switch providers. Wanting to establish care at [facility].

## 2016-04-28 NOTE — Telephone Encounter (Signed)
This Case Manager received communication from Eden Lathe, RN CM that patient's daughter, [name], indicated patient wanting an appointment at [facility]. This Case Manager placed call to follow-up and also to discuss the [facility]. Call placed to [phone]; however, unable to reach patient. HIPPA compliant voicemail left requesting return call.

## 2016-05-03 ENCOUNTER — Telehealth: Payer: Self-pay

## 2016-05-03 NOTE — Telephone Encounter (Signed)
This Case Manager placed call to patient to remind her of upcoming [facility] appointment on 05/04/16 at 1400 with Dr. Jarold Song. Patient indicated she would be at her appointment. Patient indicated she was having "burning" pain in her right leg below knee. Patient rates pain 6/10 after recently taking ibuprofen. Patient indicated pain is worse at night and has been present for about two months. Patient denies injury, rash, or redness. Patient indicated she went to ED in 11/17 and a venous duplex of right lower extremity was done on 02/05/16. Will route to provider so she is aware. Patient indicated she has transportation to her upcoming appointment.

## 2016-05-04 ENCOUNTER — Telehealth: Payer: Self-pay

## 2016-05-04 ENCOUNTER — Inpatient Hospital Stay: Payer: Medicaid Other | Admitting: Family Medicine

## 2016-05-04 NOTE — Telephone Encounter (Signed)
1355-This Case Manager received call from patient who indicated her ride cancelled on her last minute. She indicated it was too late for her to schedule a ride through [place]. Patient's appointment rescheduled. Dr. Jarold Song aware.

## 2016-05-04 NOTE — Telephone Encounter (Signed)
noted 

## 2016-05-05 ENCOUNTER — Telehealth: Payer: Self-pay

## 2016-05-05 NOTE — Telephone Encounter (Signed)
Attempted to contact the patient to check on her status and to remind her of her appointment at the [facility] tomorrow, 05/06/16 @ 0915. Call placed to # [phone] (M) and a HIPAA compliant voicemail message was left requesting a call back to # [phone] or [phone].

## 2016-05-06 ENCOUNTER — Encounter: Payer: Self-pay | Admitting: Family Medicine

## 2016-05-06 ENCOUNTER — Telehealth: Payer: Self-pay

## 2016-05-06 ENCOUNTER — Ambulatory Visit: Payer: Medicaid Other | Attending: Family Medicine | Admitting: Family Medicine

## 2016-05-06 VITALS — BP 110/68 | HR 82 | Temp 98.4°F | Ht 59.0 in | Wt 165.0 lb

## 2016-05-06 DIAGNOSIS — Z7982 Long term (current) use of aspirin: Secondary | ICD-10-CM | POA: Insufficient documentation

## 2016-05-06 DIAGNOSIS — E7800 Pure hypercholesterolemia, unspecified: Secondary | ICD-10-CM | POA: Insufficient documentation

## 2016-05-06 DIAGNOSIS — I5022 Chronic systolic (congestive) heart failure: Secondary | ICD-10-CM | POA: Diagnosis not present

## 2016-05-06 DIAGNOSIS — R42 Dizziness and giddiness: Secondary | ICD-10-CM | POA: Insufficient documentation

## 2016-05-06 DIAGNOSIS — Z91040 Latex allergy status: Secondary | ICD-10-CM | POA: Insufficient documentation

## 2016-05-06 DIAGNOSIS — Z794 Long term (current) use of insulin: Secondary | ICD-10-CM | POA: Insufficient documentation

## 2016-05-06 DIAGNOSIS — Z885 Allergy status to narcotic agent status: Secondary | ICD-10-CM | POA: Insufficient documentation

## 2016-05-06 DIAGNOSIS — Z79899 Other long term (current) drug therapy: Secondary | ICD-10-CM | POA: Diagnosis not present

## 2016-05-06 DIAGNOSIS — E1169 Type 2 diabetes mellitus with other specified complication: Secondary | ICD-10-CM | POA: Insufficient documentation

## 2016-05-06 DIAGNOSIS — E1140 Type 2 diabetes mellitus with diabetic neuropathy, unspecified: Secondary | ICD-10-CM | POA: Insufficient documentation

## 2016-05-06 DIAGNOSIS — I509 Heart failure, unspecified: Secondary | ICD-10-CM | POA: Insufficient documentation

## 2016-05-06 DIAGNOSIS — I10 Essential (primary) hypertension: Secondary | ICD-10-CM

## 2016-05-06 DIAGNOSIS — I110 Hypertensive heart disease with heart failure: Secondary | ICD-10-CM | POA: Insufficient documentation

## 2016-05-06 HISTORY — DX: Dizziness and giddiness: R42

## 2016-05-06 HISTORY — DX: Type 2 diabetes mellitus with diabetic neuropathy, unspecified: E11.40

## 2016-05-06 LAB — GLUCOSE, POCT (MANUAL RESULT ENTRY): POC Glucose: 294 mg/dl — AB (ref 70–99)

## 2016-05-06 LAB — POCT GLYCOSYLATED HEMOGLOBIN (HGB A1C): HEMOGLOBIN A1C: 12.2

## 2016-05-06 MED ORDER — ACCU-CHEK SOFTCLIX LANCET DEV MISC: 5 refills | Status: DC

## 2016-05-06 MED ORDER — INSULIN ASPART 100 UNIT/ML ~~LOC~~ SOLN: SUBCUTANEOUS | 3 refills | Status: DC

## 2016-05-06 MED ORDER — GLUCOSE BLOOD VI STRP: ORAL_STRIP | 12 refills | Status: DC

## 2016-05-06 MED ORDER — MECLIZINE HCL 32 MG PO TABS: 32.0000 mg | ORAL_TABLET | Freq: Two times a day (BID) | ORAL | 2 refills | Status: DC | PRN

## 2016-05-06 MED ORDER — MECLIZINE HCL 25 MG PO TABS: 25.0000 mg | ORAL_TABLET | Freq: Two times a day (BID) | ORAL | 1 refills | Status: DC | PRN

## 2016-05-06 MED ORDER — LOSARTAN POTASSIUM 50 MG PO TABS: 50.0000 mg | ORAL_TABLET | Freq: Every day | ORAL | 3 refills | Status: DC

## 2016-05-06 MED ORDER — GABAPENTIN 300 MG PO CAPS: 300.0000 mg | ORAL_CAPSULE | Freq: Two times a day (BID) | ORAL | 3 refills | Status: DC

## 2016-05-06 MED ORDER — GABAPENTIN 300 MG PO CAPS: 300.0000 mg | ORAL_CAPSULE | Freq: Three times a day (TID) | ORAL | 3 refills | Status: DC

## 2016-05-06 MED ORDER — INSULIN GLARGINE 100 UNIT/ML ~~LOC~~ SOLN: 35.0000 [IU] | Freq: Every day | SUBCUTANEOUS | 1 refills | Status: DC

## 2016-05-06 MED ORDER — FUROSEMIDE 40 MG PO TABS: 40.0000 mg | ORAL_TABLET | Freq: Every day | ORAL | 3 refills | Status: DC

## 2016-05-06 NOTE — Progress Notes (Signed)
Med refills- enalapril, losartan

## 2016-05-06 NOTE — Progress Notes (Signed)
Transitional care clinic  Subjective:  Patient ID: Kara Hanson, female    DOB: 1955/04/10  Age: 61 y.o. MRN: [id number]  CC: Dizziness; Chest Pain; Leg Pain (lower leg burning-radiates up to stomach); and Diabetes   HPI Gursimran Litaker is a 61 year old female with a history of type 2 diabetes mellitus (A1c 12.2), hypertension, congestive heart failure (EF 30-35% from 2-D echo of 04/23/16) presents today to establish care at the transitional care clinic.  She presented with chest pain and syncope to the ED and underwent cardiac cath due to low ejection fraction; cardiac cath findings revealed normal coronaries and medical therapy was recommended. Scheduled to see cardiology next week  Today she complains of shortness of breath on mild-to-moderate exertion but no pedal edema and no orthopnea. She also complains of dizziness on turning her head and change in position but denies tinnitus or ear pain. Also complains of burning sensation in the shin of her right leg  Past Medical History:  Diagnosis Date  . Diabetes mellitus without complication ([place])   . Hypercholesteremia   . Hypertension   . Spinal stenosis     Past Surgical History:  Procedure Laterality Date  . BLADDER SURGERY    . CARDIAC CATHETERIZATION N/A 04/25/2016   Procedure: Left Heart Cath and Coronary Angiography;  Surgeon: Lorretta Harp, MD;  Location: [facility];  Service: Cardiovascular;  Laterality: N/A;  . CESAREAN SECTION    . CHOLECYSTECTOMY      Allergies  Allergen Reactions  . Latex Itching  . Morphine And Related Itching    Headache      Outpatient Medications Prior to Visit  Medication Sig Dispense Refill  . aspirin EC 81 MG tablet Take 81 mg by mouth daily.    . carvedilol (COREG) 12.5 MG tablet Take 1 tablet (12.5 mg total) by mouth 2 (two) times daily with a meal. 60 tablet 0  . ibuprofen (ADVIL,MOTRIN) 200 MG tablet Take 600 mg by mouth every 6 (six) hours as needed for headache,  mild pain or moderate pain.    . pravastatin (PRAVACHOL) 40 MG tablet Take 40 mg by mouth every evening.    . insulin aspart (NOVOLOG) 100 UNIT/ML injection Inject 10 Units into the skin 3 (three) times daily before meals.    . insulin glargine (LANTUS) 100 UNIT/ML injection Inject 0.25 mLs (25 Units total) into the skin at bedtime. 10 mL 1  . HYDROcodone-acetaminophen (NORCO/VICODIN) 5-325 MG tablet Take 1 tablet by mouth every 6 (six) hours as needed for moderate pain. (Patient not taking: Reported on 05/06/2016) 6 tablet 0  . enalapril (VASOTEC) 10 MG tablet Take 1 tablet (10 mg total) by mouth daily. (Patient not taking: Reported on 05/06/2016) 30 tablet 0  . losartan (COZAAR) 25 MG tablet Take 1 tablet (25 mg total) by mouth daily. (Patient not taking: Reported on 05/06/2016) 30 tablet 0   No facility-administered medications prior to visit.     ROS Review of Systems  Constitutional: Negative for activity change, appetite change and fatigue.  HENT: Negative for congestion, sinus pressure and sore throat.   Eyes: Negative for visual disturbance.  Respiratory: Positive for shortness of breath. Negative for cough, chest tightness and wheezing.   Cardiovascular: Negative for chest pain and palpitations.  Gastrointestinal: Negative for abdominal distention, abdominal pain and constipation.  Endocrine: Negative for polydipsia.  Genitourinary: Negative for dysuria and frequency.  Musculoskeletal: Negative for arthralgias and back pain.  Skin: Negative for rash.  Neurological: Positive  for light-headedness and numbness. Negative for tremors.  Hematological: Does not bruise/bleed easily.  Psychiatric/Behavioral: Negative for agitation and behavioral problems.    Objective:  BP 110/68 (BP Location: Right Arm, Patient Position: Sitting, Cuff Size: Small)   Pulse 82   Temp 98.4 F (36.9 C) (Oral)   Ht 4\' 11"  (1.499 m)   Wt 165 lb (74.8 kg)   SpO2 95%   BMI 33.33 kg/m   BP/Weight 05/06/2016  04/26/2016 8/85/0277  Systolic BP 412 878 -  Diastolic BP 68 70 -  Wt. (Lbs) 165 - 164.7  BMI 33.33 - 33.27      Physical Exam  Constitutional: She is oriented to person, place, and time. She appears well-developed and well-nourished.  HENT:  Vertigo with change of position  Cardiovascular: Normal rate, normal heart sounds and intact distal pulses.   No murmur heard. Pulmonary/Chest: Effort normal and breath sounds normal. She has no wheezes. She has no rales. She exhibits no tenderness.  Abdominal: Soft. Bowel sounds are normal. She exhibits no distension and no mass. There is no tenderness.  Musculoskeletal: Normal range of motion.  Neurological: She is alert and oriented to person, place, and time.    Lab Results  Component Value Date   HGBA1C 12.2 05/06/2016    CMP Latest Ref Rng & Units 04/26/2016 04/25/2016 04/24/2016  Glucose 65 - 99 mg/dL 257(H) 96 142(H)  BUN 6 - 20 mg/dL 11 11 12   Creatinine 0.44 - 1.00 mg/dL 0.81 0.78 0.71  Sodium 135 - 145 mmol/L 134(L) 137 137  Potassium 3.5 - 5.1 mmol/L 3.8 3.6 3.8  Chloride 101 - 111 mmol/L 102 105 106  CO2 22 - 32 mmol/L 25 27 26   Calcium 8.9 - 10.3 mg/dL 9.1 9.1 9.1  Total Protein 6.5 - 8.1 g/dL - - -  Total Bilirubin 0.3 - 1.2 mg/dL - - -  Alkaline Phos 38 - 126 U/L - - -  AST 15 - 41 U/L - - -  ALT 14 - 54 U/L - - -      Assessment & Plan:   1. Type 2 diabetes mellitus with other specified complication, with long-term current use of insulin (HCC) A1c 12.2 and urine increased dose of Lantus Commence sliding scale and review blood sugar log at next visit Keep blood sugar logs with fasting goals of 80-120 mg/dl, random of less than 180 and in the event of sugars less than 60 mg/dl or greater than 400 mg/dl please notify the clinic ASAP. It is recommended that you undergo annual eye exams and annual foot exams. Pneumovax is recommended every 5 years before the age of 63 and once for a lifetime at or after the age of  59. - Glucose (CBG) - HgB A1c - Lancet Devices (ACCU-CHEK SOFTCLIX) lancets; Use as instructed daily.  Dispense: 1 each; Refill: 5 - insulin glargine (LANTUS) 100 UNIT/ML injection; Inject 0.35 mLs (35 Units total) into the skin at bedtime.  Dispense: 10 mL; Refill: 1 - insulin aspart (NOVOLOG) 100 UNIT/ML injection; 0-12 units according to sliding scale  Dispense: 10 mL; Refill: 3  2. Chronic systolic congestive heart failure (HCC) EF 30-35% Euvolemic Commenced Lasix due to dyspnea Keep daily weight log, limit fluid intake to less than 2 L a day - furosemide (LASIX) 40 MG tablet; Take 1 tablet (40 mg total) by mouth daily.  Dispense: 30 tablet; Refill: 3  3. Type 2 diabetes mellitus with diabetic neuropathy, with long-term current use of insulin ([place]) Explain  burning sensation Commence gabapentin - gabapentin (NEURONTIN) 300 MG capsule; Take 1 capsule (300 mg total) by mouth 3 (three) times daily.  Dispense: 30 capsule; Refill: 3  4. Vertigo - meclizine (ANTIVERT) 32 MG tablet; Take 1 tablet (32 mg total) by mouth 2 (two) times daily as needed.  Dispense: 60 tablet; Refill: 2  5. Essential hypertension Controlled Discontinue enalapril as she is taking both an ACE inhibitor and ARB - losartan (COZAAR) 50 MG tablet; Take 1 tablet (50 mg total) by mouth daily.  Dispense: 30 tablet; Refill: 3   Meds ordered this encounter  Medications  . losartan (COZAAR) 50 MG tablet    Sig: Take 1 tablet (50 mg total) by mouth daily.    Dispense:  30 tablet    Refill:  3    Discontinue Enalapril  . gabapentin (NEURONTIN) 300 MG capsule    Sig: Take 1 capsule (300 mg total) by mouth 3 (three) times daily.    Dispense:  30 capsule    Refill:  3  . meclizine (ANTIVERT) 32 MG tablet    Sig: Take 1 tablet (32 mg total) by mouth 2 (two) times daily as needed.    Dispense:  60 tablet    Refill:  2  . furosemide (LASIX) 40 MG tablet    Sig: Take 1 tablet (40 mg total) by mouth daily.    Dispense:   30 tablet    Refill:  3  . Lancet Devices (ACCU-CHEK SOFTCLIX) lancets    Sig: Use as instructed daily.    Dispense:  1 each    Refill:  5  . insulin glargine (LANTUS) 100 UNIT/ML injection    Sig: Inject 0.35 mLs (35 Units total) into the skin at bedtime.    Dispense:  10 mL    Refill:  1  . insulin aspart (NOVOLOG) 100 UNIT/ML injection    Sig: 0-12 units according to sliding scale    Dispense:  10 mL    Refill:  3    Follow-up: Return in about 1 month (around 06/03/2016) for Follow-up on diabetes mellitus.   Arnoldo Morale MD

## 2016-05-06 NOTE — Telephone Encounter (Signed)
Met with the patient and her daughter, [name],  when she was in the clinic today to see Dr Jarold Song.  Discussed transportation options. The patient stated that she does not feel that she would qualify for SCAT. She does have medicaid and her daughter said that she would call Medicaid for a transportation assessment. She said that she has the phone # for DSS.   The option of PCS services were discussed and the patient said that she does not need help with personal care.

## 2016-05-06 NOTE — Patient Instructions (Signed)
Diabetes Mellitus and Food It is important for you to manage your blood sugar (glucose) level. Your blood glucose level can be greatly affected by what you eat. Eating healthier foods in the appropriate amounts throughout the day at about the same time each day will help you control your blood glucose level. It can also help slow or prevent worsening of your diabetes mellitus. Healthy eating may even help you improve the level of your blood pressure and reach or maintain a healthy weight. General recommendations for healthful eating and cooking habits include:  Eating meals and snacks regularly. Avoid going long periods of time without eating to lose weight.  Eating a diet that consists mainly of plant-based foods, such as fruits, vegetables, nuts, legumes, and whole grains.  Using low-heat cooking methods, such as baking, instead of high-heat cooking methods, such as deep frying.  Work with your dietitian to make sure you understand how to use the Nutrition Facts information on food labels. How can food affect me? Carbohydrates Carbohydrates affect your blood glucose level more than any other type of food. Your dietitian will help you determine how many carbohydrates to eat at each meal and teach you how to count carbohydrates. Counting carbohydrates is important to keep your blood glucose at a healthy level, especially if you are using insulin or taking certain medicines for diabetes mellitus. Alcohol Alcohol can cause sudden decreases in blood glucose (hypoglycemia), especially if you use insulin or take certain medicines for diabetes mellitus. Hypoglycemia can be a life-threatening condition. Symptoms of hypoglycemia (sleepiness, dizziness, and disorientation) are similar to symptoms of having too much alcohol. If your health care provider has given you approval to drink alcohol, do so in moderation and use the following guidelines:  Women should not have more than one drink per day, and men  should not have more than two drinks per day. One drink is equal to: ? 12 oz of beer. ? 5 oz of wine. ? 1 oz of hard liquor.  Do not drink on an empty stomach.  Keep yourself hydrated. Have water, diet soda, or unsweetened iced tea.  Regular soda, juice, and other mixers might contain a lot of carbohydrates and should be counted.  What foods are not recommended? As you make food choices, it is important to remember that all foods are not the same. Some foods have fewer nutrients per serving than other foods, even though they might have the same number of calories or carbohydrates. It is difficult to get your body what it needs when you eat foods with fewer nutrients. Examples of foods that you should avoid that are high in calories and carbohydrates but low in nutrients include:  Trans fats (most processed foods list trans fats on the Nutrition Facts label).  Regular soda.  Juice.  Candy.  Sweets, such as cake, pie, doughnuts, and cookies.  Fried foods.  What foods can I eat? Eat nutrient-rich foods, which will nourish your body and keep you healthy. The food you should eat also will depend on several factors, including:  The calories you need.  The medicines you take.  Your weight.  Your blood glucose level.  Your blood pressure level.  Your cholesterol level.  You should eat a variety of foods, including:  Protein. ? Lean cuts of meat. ? Proteins low in saturated fats, such as fish, egg whites, and beans. Avoid processed meats.  Fruits and vegetables. ? Fruits and vegetables that may help control blood glucose levels, such as apples,   mangoes, and yams.  Dairy products. ? Choose fat-free or low-fat dairy products, such as milk, yogurt, and cheese.  Grains, bread, pasta, and rice. ? Choose whole grain products, such as multigrain bread, whole oats, and brown rice. These foods may help control blood pressure.  Fats. ? Foods containing healthful fats, such as  nuts, avocado, olive oil, canola oil, and fish.  Does everyone with diabetes mellitus have the same meal plan? Because every person with diabetes mellitus is different, there is not one meal plan that works for everyone. It is very important that you meet with a dietitian who will help you create a meal plan that is just right for you. This information is not intended to replace advice given to you by your health care provider. Make sure you discuss any questions you have with your health care provider. Document Released: 12/16/2004 Document Revised: 08/27/2015 Document Reviewed: 02/15/2013 Elsevier Interactive Patient Education  2017 Elsevier Inc[employer]  

## 2016-05-11 ENCOUNTER — Inpatient Hospital Stay: Payer: Medicaid Other | Admitting: Family Medicine

## 2016-05-12 ENCOUNTER — Ambulatory Visit: Payer: Medicaid Other | Admitting: Cardiology

## 2016-05-13 ENCOUNTER — Telehealth: Payer: Self-pay

## 2016-05-13 NOTE — Telephone Encounter (Signed)
This Case Manager placed call to patient to check status. Patient denied any problems or health concerns at this time. Inquired if patient picked up new medications as Dr. Jarold Song prescribed several medications as appointment on 05/06/16. Patient indicated [facility] delivered medications, and she has all prescribed medications. Discussed importance of medication compliance, and patient indicated she was taking medications as prescribed. Patient denied questions about her medications. Reminded patient to check her blood glucose three times daily prior to meals and to be begin keeping a record of blood glucose readings. Patient indicated she was keeping a record of her blood glucose readings. Also informed patient to begin weighing herself daily and recording her daily weights. Advised patient on the importance of adhering to a low sodium diet and to limiting fluid to less than 2L/day. Patient verbalized understanding. Discussed Medicaid transportation with patient, and she indicated she planned to continue using the city bus as needed. Reminded patient that Dr. Jarold Song wants her to follow-up with her around 06/03/16. Patient verbalized understanding. No additional needs identified.

## 2016-05-19 ENCOUNTER — Ambulatory Visit (INDEPENDENT_AMBULATORY_CARE_PROVIDER_SITE_OTHER): Payer: Medicare Other | Admitting: Cardiology

## 2016-05-19 ENCOUNTER — Encounter: Payer: Self-pay | Admitting: Cardiology

## 2016-05-19 VITALS — BP 116/86 | HR 81 | Ht 59.0 in | Wt 167.2 lb

## 2016-05-19 DIAGNOSIS — R42 Dizziness and giddiness: Secondary | ICD-10-CM

## 2016-05-19 DIAGNOSIS — I10 Essential (primary) hypertension: Secondary | ICD-10-CM

## 2016-05-19 DIAGNOSIS — I5181 Takotsubo syndrome: Secondary | ICD-10-CM | POA: Insufficient documentation

## 2016-05-19 HISTORY — DX: Takotsubo syndrome: I51.81

## 2016-05-19 MED ORDER — PRAVASTATIN SODIUM 40 MG PO TABS: 40.0000 mg | ORAL_TABLET | Freq: Every evening | ORAL | 3 refills | Status: DC

## 2016-05-19 MED ORDER — LOSARTAN POTASSIUM 50 MG PO TABS: 50.0000 mg | ORAL_TABLET | Freq: Every day | ORAL | 3 refills | Status: DC

## 2016-05-19 MED ORDER — CARVEDILOL 12.5 MG PO TABS: 12.5000 mg | ORAL_TABLET | Freq: Two times a day (BID) | ORAL | 3 refills | Status: DC

## 2016-05-19 NOTE — Patient Instructions (Signed)
Medication Instructions:  You may discontinue your Furosemide. Continue all other medications as listed.  Follow-Up: Follow up as needed with Dr Marlou Porch.  Thank you for choosing [facility]!!

## 2016-05-19 NOTE — Progress Notes (Signed)
Cardiology Office Note    Date:  05/19/2016   ID:  Kara Hanson, DOB [birth year], MRN [id number]  PCP:  Kristine Garbe, MD  Cardiologist:   Candee Furbish, MD   Chief Complaint  Patient presents with  . 2 week f/u  . Dizziness    from vertigo    History of Present Illness:  Kara Hanson is a 61 y.o. female who I saw originally in the hospital in consultation on 04/23/16 who underwent cardiac catheterization because of newly discovered ejection fraction of 30-35%, started on carvedilol, brief episode of syncope at home with T wave inversion V2-3 whose catheterization showed normal coronary arteries and normal ejection fraction EF 50% with diabetes, hypertension, hyperlipidemia with no prior cardiac history admitted with sudden loss of consciousness witnessed at around midnight while watching television with her daughter. Perhaps this was stress-induced cardiomyopathy. Post catheterization she felt strange and had a head CT that showed small vessel changes neuro nonfocal. After D50 she was normal. She went home.  Her chief complaint/history of present illness when going into the hospital in late January was as follows:    She felt dizziness all day long she states. This lasted a few seconds no seizure-like activity. She developed sharp chest discomfort following this episode with no radiation that improved after a few minutes. She did receive dilauded to help with chest discomfort. Once in the emergency department, she continued to have some chest tightness as well as shortness of breath. Given her recent travel history to [place], a CT scan of her chest was performed which demonstrated no evidence of pulmonary embolism. This did however show aortic atherosclerosis as well as coronary atherosclerosis in the form of calcifications.  Approximately 3 years ago she had cardiac catheterization in [place] with no stents placed per patient.  Her EKG did demonstrate T wave inversion inferior  and laterally new.This sometimes is classic for stress induced cardiomyopathy.  Nonsmoker, no alcohol, no drug use  Here in the clinic, she is not having any chest pain, sometimes she will wake up short of breath at night with this may be anxiety. Her left ventricular end-diastolic pressure was normal on cardiac catheterization. Her ejection fraction was normal on cardiac catheterization. She clearly is having vertigo. When walking, she is holding onto the wall at times.  She was placed on furosemide however now she feels quite dry. Once again she did not have elevated filling pressures and her heart and her pump function was normal. We have decided to discontinue. Her daughter has been giving her more water to try to combat dryness.   Past Medical History:  Diagnosis Date  . Diabetes mellitus without complication ([place])   . Hypercholesteremia   . Hypertension   . Spinal stenosis     Past Surgical History:  Procedure Laterality Date  . BLADDER SURGERY    . CARDIAC CATHETERIZATION N/A 04/25/2016   Procedure: Left Heart Cath and Coronary Angiography;  Surgeon: Lorretta Harp, MD;  Location: [facility];  Service: Cardiovascular;  Laterality: N/A;  . CESAREAN SECTION    . CHOLECYSTECTOMY      Current Medications: Outpatient Medications Prior to Visit  Medication Sig Dispense Refill  . aspirin EC 81 MG tablet Take 81 mg by mouth daily.    Marland Kitchen gabapentin (NEURONTIN) 300 MG capsule Take 1 capsule (300 mg total) by mouth 2 (two) times daily. 60 capsule 3  . glucose blood (ACCU-CHEK AVIVA) test strip Use as instructed 3 times  daily 100 each 12  . HYDROcodone-acetaminophen (NORCO/VICODIN) 5-325 MG tablet Take 1 tablet by mouth every 6 (six) hours as needed for moderate pain. 6 tablet 0  . ibuprofen (ADVIL,MOTRIN) 200 MG tablet Take 600 mg by mouth every 6 (six) hours as needed for headache, mild pain or moderate pain.    Marland Kitchen insulin aspart (NOVOLOG) 100 UNIT/ML injection 0-12 units  according to sliding scale 10 mL 3  . insulin glargine (LANTUS) 100 UNIT/ML injection Inject 0.35 mLs (35 Units total) into the skin at bedtime. 10 mL 1  . Lancet Devices (ACCU-CHEK SOFTCLIX) lancets Use as instructed three times daily 1 each 5  . meclizine (ANTIVERT) 25 MG tablet Take 1 tablet (25 mg total) by mouth 2 (two) times daily as needed. 60 tablet 1  . carvedilol (COREG) 12.5 MG tablet Take 1 tablet (12.5 mg total) by mouth 2 (two) times daily with a meal. 60 tablet 0  . furosemide (LASIX) 40 MG tablet Take 1 tablet (40 mg total) by mouth daily. 30 tablet 3  . losartan (COZAAR) 50 MG tablet Take 1 tablet (50 mg total) by mouth daily. 30 tablet 3  . pravastatin (PRAVACHOL) 40 MG tablet Take 40 mg by mouth every evening.     No facility-administered medications prior to visit.      Allergies:   Latex and Morphine and related   Social History   Social History  . Marital status: Married    Spouse name: N/A  . Number of children: N/A  . Years of education: N/A   Social History Main Topics  . Smoking status: Never Smoker  . Smokeless tobacco: Never Used  . Alcohol use No  . Drug use: No  . Sexual activity: Not Currently    Birth control/ protection: None   Other Topics Concern  . None   Social History Narrative  . None     Family History:  The patient's family history includes Diabetes Mellitus II in her father; Stroke in her father.   ROS:   Please see the history of present illness.    ROS All other systems reviewed and are negative.   PHYSICAL EXAM:   VS:  BP 116/86   Pulse 81   Ht 4\' 11"  (1.499 m)   Wt 167 lb 3.2 oz (75.8 kg)   SpO2 97%   BMI 33.77 kg/m    GEN: Well nourished, well developed, in no acute distress  HEENT: normal  Neck: no JVD, carotid bruits, or masses Cardiac: RRR; no murmurs, rubs, or gallops,no edema  Respiratory:  clear to auscultation bilaterally, normal work of breathing GI: soft, nontender, nondistended, + BS MS: no deformity or  atrophy  Skin: warm and dry, no rash Neuro:  Alert and Oriented x 3, Strength and sensation are intact Psych: euthymic mood, full affect  Wt Readings from Last 3 Encounters:  05/19/16 167 lb 3.2 oz (75.8 kg)  05/06/16 165 lb (74.8 kg)  04/25/16 164 lb 11.2 oz (74.7 kg)      Studies/Labs Reviewed:   EKG:  Prior hospital EKG shows T-wave inversion V2 V3 sinus rhythm.  Recent Labs: 10/01/2015: B Natriuretic Peptide 15.3 04/23/2016: ALT 19; TSH 2.153 04/25/2016: Magnesium 1.8 04/26/2016: BUN 11; Creatinine, Ser 0.81; Hemoglobin 11.9; Platelets 208; Potassium 3.8; Sodium 134   Lipid Panel No results found for: CHOL, TRIG, HDL, CHOLHDL, VLDL, LDLCALC, LDLDIRECT  Additional studies/ records that were reviewed today include:   Echocardiogram EF 35%, cardiac catheterization EF 50% with normal  coronary arteries    ASSESSMENT:    1. Vertigo   2. Essential hypertension   3. Stress-induced cardiomyopathy      PLAN:  In order of problems listed above:  Syncope/Transient reduction in ejection fraction  - Unsure what the inciting event was. Perhaps it was similar to what she was experiencing in the hospital when her glucose was slightly low and she felt better after D50.  - Cardiac catheterization showed normal coronary arteries reassuringly. Her EF also was 50%.  - Her original echocardiogram showed an ejection fraction of 35%, I wonder if the resolution of her ejection fraction was secondary to stress induced cardiomyopathy. Her husband has been quite sick and hospitalized with amputation of a toe, peripheral vascular disease. Both she and her daughter are under increased stress.  - I think continuing her carvedilol makes sense.Thankfully, her ejection fraction is now normal. She does NOT have systolic dysfunction or chronic systolic heart failure  I'm unsure why she is dizzy-sounds vertiginous, could consider ENT if necessary.  Since her left ventricular end-diastolic pressure is  normal and clinically she is feeling "dry" we will discontinue her furosemide.  Essential hypertension  - Since her blood pressure is under much better control with the carvedilol and losartan, I would recommend that she continue with these medications.  Please let me know if I can be of further assistance. We will see her back as needed.  Medication Adjustments/Labs and Tests Ordered: Current medicines are reviewed at length with the patient today.  Concerns regarding medicines are outlined above.  Medication changes, Labs and Tests ordered today are listed in the Patient Instructions below. Patient Instructions  Medication Instructions:  You may discontinue your Furosemide. Continue all other medications as listed.  Follow-Up: Follow up as needed with Dr Marlou Porch.  Thank you for choosing [facility]!!        Signed, Candee Furbish, MD  05/19/2016 4:31 PM    [facility] [place], [place], [place]  [address] Phone: [phone]; Fax: [phone]

## 2016-05-26 ENCOUNTER — Telehealth: Payer: Self-pay

## 2016-05-26 NOTE — Telephone Encounter (Signed)
Attempted to contact the patient to check on her status and to discuss scheduling a follow up appointment with Dr Jarold Song.  Call placed to # [phone] (H) and a HIPAA compliant voicemail message was left requesting a call back to # [phone] or [phone].

## 2016-06-01 ENCOUNTER — Telehealth: Payer: Self-pay

## 2016-06-01 NOTE — Telephone Encounter (Signed)
Attempted to contact the patient to check on her status and to discuss scheduling a follow up appointment at the clinic. Call placed to # [phone] (H) and a HIPAA compliant voicemail message was left requesting a call back to # [phone] or [phone]

## 2016-06-07 ENCOUNTER — Telehealth: Payer: Self-pay

## 2016-06-07 MED ORDER — TRAMADOL HCL 50 MG PO TABS: 50.0000 mg | ORAL_TABLET | Freq: Two times a day (BID) | ORAL | 0 refills | Status: DC | PRN

## 2016-06-07 NOTE — Telephone Encounter (Signed)
Opened in error

## 2016-06-07 NOTE — Telephone Encounter (Signed)
Attempted to contact the patient's daughter, [name], to inform her that the prescription for tramadol has been placed at the front desk.  Call placed to # [phone] and a HIPAA compliant voicemail message requesting a call back to # [phone] or [phone].  Call placed to the patient and informed her that the prescription for tramadol is available for pick up at the front desk.  Call received from [facility] and informed her that the prescription for tramadol is available for pick up at the front desk and also reminded her that her mother has refills for the losartan available at the pharmacy  She was very appreciative of the assistance and noted that they will be leaving for [place] today for her father's burial.

## 2016-06-07 NOTE — Telephone Encounter (Signed)
She does have refills which was sent to the pharmacy on 05/19/16 at her visit with cardiology. I have written a prescription for tramadol which is ready for pickup.

## 2016-06-07 NOTE — Telephone Encounter (Signed)
Attempted to contact the patient to inquire if she is in need of refills of her medications as she will be going out of town for a funeral. Call placed to # [phone] and a HIPAA compliant voicemail message left requesting a call back to # [phone] or [phone].   Call placed to her daughter, Kara Hanson to inquire if her mother is in need of medication refills and to offer condolences for the loss of her father.  She said that they are planning to go to [place] this Wednesday or Thursday  and they will be gone about a week.   She said that her mother  has all of her medications but will need losartan.  [name] noted that there are 3 refills for the losartan. and she will contact the pharmacy.   She then reported  that her mother has been complaining of right leg pain that is burning at times and is so painful that her mother has been crying and unable to sleep. She is requesting tramadol as her mother has taken tramadol in the past.  Informed her that this information would be sent to Dr Jarold Song for review. She was very appreciative of the call.

## 2016-06-17 ENCOUNTER — Emergency Department (HOSPITAL_COMMUNITY): Payer: Medicare Other

## 2016-06-17 ENCOUNTER — Inpatient Hospital Stay (HOSPITAL_COMMUNITY)
Admission: EM | Admit: 2016-06-17 | Discharge: 2016-06-18 | DRG: 880 | Disposition: A | Discharge status: Home / Self Care | Payer: Medicare Other | Attending: Internal Medicine | Admitting: Internal Medicine

## 2016-06-17 ENCOUNTER — Encounter (HOSPITAL_COMMUNITY): Payer: Self-pay

## 2016-06-17 ENCOUNTER — Inpatient Hospital Stay (HOSPITAL_COMMUNITY): Payer: Medicare Other

## 2016-06-17 DIAGNOSIS — E1142 Type 2 diabetes mellitus with diabetic polyneuropathy: Secondary | ICD-10-CM | POA: Diagnosis present

## 2016-06-17 DIAGNOSIS — Z833 Family history of diabetes mellitus: Secondary | ICD-10-CM

## 2016-06-17 DIAGNOSIS — M5416 Radiculopathy, lumbar region: Secondary | ICD-10-CM | POA: Diagnosis present

## 2016-06-17 DIAGNOSIS — F430 Acute stress reaction: Principal | ICD-10-CM | POA: Diagnosis present

## 2016-06-17 DIAGNOSIS — M5126 Other intervertebral disc displacement, lumbar region: Secondary | ICD-10-CM | POA: Diagnosis not present

## 2016-06-17 DIAGNOSIS — R000 Tachycardia, unspecified: Secondary | ICD-10-CM | POA: Diagnosis present

## 2016-06-17 DIAGNOSIS — R079 Chest pain, unspecified: Secondary | ICD-10-CM | POA: Diagnosis present

## 2016-06-17 DIAGNOSIS — E785 Hyperlipidemia, unspecified: Secondary | ICD-10-CM | POA: Diagnosis present

## 2016-06-17 DIAGNOSIS — R0789 Other chest pain: Secondary | ICD-10-CM

## 2016-06-17 DIAGNOSIS — E1165 Type 2 diabetes mellitus with hyperglycemia: Secondary | ICD-10-CM | POA: Diagnosis present

## 2016-06-17 DIAGNOSIS — Z823 Family history of stroke: Secondary | ICD-10-CM

## 2016-06-17 DIAGNOSIS — I119 Hypertensive heart disease without heart failure: Secondary | ICD-10-CM | POA: Diagnosis present

## 2016-06-17 DIAGNOSIS — E875 Hyperkalemia: Secondary | ICD-10-CM | POA: Diagnosis present

## 2016-06-17 DIAGNOSIS — I208 Other forms of angina pectoris: Secondary | ICD-10-CM | POA: Diagnosis not present

## 2016-06-17 DIAGNOSIS — I5181 Takotsubo syndrome: Secondary | ICD-10-CM | POA: Diagnosis present

## 2016-06-17 DIAGNOSIS — M25551 Pain in right hip: Secondary | ICD-10-CM | POA: Diagnosis not present

## 2016-06-17 DIAGNOSIS — Z794 Long term (current) use of insulin: Secondary | ICD-10-CM | POA: Diagnosis not present

## 2016-06-17 DIAGNOSIS — I10 Essential (primary) hypertension: Secondary | ICD-10-CM | POA: Diagnosis present

## 2016-06-17 DIAGNOSIS — E7800 Pure hypercholesterolemia, unspecified: Secondary | ICD-10-CM | POA: Diagnosis present

## 2016-06-17 DIAGNOSIS — M79606 Pain in leg, unspecified: Secondary | ICD-10-CM

## 2016-06-17 DIAGNOSIS — M4802 Spinal stenosis, cervical region: Secondary | ICD-10-CM | POA: Diagnosis present

## 2016-06-17 DIAGNOSIS — Z7982 Long term (current) use of aspirin: Secondary | ICD-10-CM | POA: Diagnosis not present

## 2016-06-17 HISTORY — DX: Heart failure, unspecified: I50.9

## 2016-06-17 HISTORY — DX: Chest pain, unspecified: R07.9

## 2016-06-17 LAB — CBC
HCT: 37.5 % (ref 36.0–46.0)
HEMATOCRIT: 36.6 % (ref 36.0–46.0)
Hemoglobin: 12.7 g/dL (ref 12.0–15.0)
Hemoglobin: 12.3 g/dL (ref 12.0–15.0)
MCH: 28 pg (ref 26.0–34.0)
MCH: 27.8 pg (ref 26.0–34.0)
MCHC: 33.9 g/dL (ref 30.0–36.0)
MCHC: 33.6 g/dL (ref 30.0–36.0)
MCV: 82.8 fL (ref 78.0–100.0)
MCV: 82.8 fL (ref 78.0–100.0)
PLATELETS: 199 10*3/uL (ref 150–400)
Platelets: 218 10*3/uL (ref 150–400)
RBC: 4.53 MIL/uL (ref 3.87–5.11)
RBC: 4.42 MIL/uL (ref 3.87–5.11)
RDW: 13.2 % (ref 11.5–15.5)
RDW: 13 % (ref 11.5–15.5)
WBC: 5.9 10*3/uL (ref 4.0–10.5)
WBC: 5 10*3/uL (ref 4.0–10.5)

## 2016-06-17 LAB — TROPONIN I: Troponin I: <0.03 ng/mL (ref <0.03)

## 2016-06-17 LAB — URINALYSIS, ROUTINE W REFLEX MICROSCOPIC
Bacteria, UA: NONE SEEN
Bilirubin Urine: NEGATIVE
Glucose, UA: >=500 mg/dL — AB
Hgb urine dipstick: NEGATIVE
Ketones, ur: NEGATIVE mg/dL
Nitrite: NEGATIVE
Protein, ur: 100 mg/dL — AB
Specific Gravity, Urine: 1.02 (ref 1.005–1.030)
pH: 7 (ref 5.0–8.0)

## 2016-06-17 LAB — I-STAT TROPONIN, ED
TROPONIN I, POC: 0 ng/mL (ref 0.00–0.08)
Troponin i, poc: 0 ng/mL (ref 0.00–0.08)

## 2016-06-17 LAB — CBG MONITORING, ED
GLUCOSE-CAPILLARY: 484 mg/dL — AB (ref 65–99)
GLUCOSE-CAPILLARY: 188 mg/dL — AB (ref 65–99)

## 2016-06-17 LAB — COMPREHENSIVE METABOLIC PANEL
ALT: 19 U/L (ref 14–54)
AST: 19 U/L (ref 15–41)
Albumin: 3.4 g/dL — ABNORMAL LOW (ref 3.5–5.0)
Alkaline Phosphatase: 69 U/L (ref 38–126)
Anion gap: 11 (ref 5–15)
BUN: 24 mg/dL — ABNORMAL HIGH (ref 6–20)
CHLORIDE: 98 mmol/L — AB (ref 101–111)
CO2: 22 mmol/L (ref 22–32)
CREATININE: 1.07 mg/dL — AB (ref 0.44–1.00)
Calcium: 9.2 mg/dL (ref 8.9–10.3)
GFR, EST NON AFRICAN AMERICAN: 55 mL/min — AB (ref >60)
Glucose, Bld: 498 mg/dL — ABNORMAL HIGH (ref 65–99)
POTASSIUM: 5.2 mmol/L — AB (ref 3.5–5.1)
Sodium: 131 mmol/L — ABNORMAL LOW (ref 135–145)
Total Bilirubin: 0.3 mg/dL (ref 0.3–1.2)
Total Protein: 5.9 g/dL — ABNORMAL LOW (ref 6.5–8.1)

## 2016-06-17 LAB — CREATININE, SERUM: Creatinine, Ser: 0.98 mg/dL (ref 0.44–1.00)

## 2016-06-17 LAB — GLUCOSE, CAPILLARY: Glucose-Capillary: 185 mg/dL — ABNORMAL HIGH (ref 65–99)

## 2016-06-17 LAB — T4, FREE: Free T4: 0.96 ng/dL (ref 0.61–1.12)

## 2016-06-17 LAB — TSH: TSH: 1.263 u[IU]/mL (ref 0.350–4.500)

## 2016-06-17 LAB — BRAIN NATRIURETIC PEPTIDE: B NATRIURETIC PEPTIDE 5: 27.1 pg/mL (ref 0.0–100.0)

## 2016-06-17 MED ORDER — ASPIRIN EC 325 MG PO TBEC
325.0000 mg | DELAYED_RELEASE_TABLET | Freq: Every day | ORAL | Status: DC
  Administered 2016-06-18: 325 mg via ORAL
  Filled 2016-06-17: qty 1

## 2016-06-17 MED ORDER — INSULIN ASPART 100 UNIT/ML ~~LOC~~ SOLN
10.0000 [IU] | Freq: Once | SUBCUTANEOUS | Status: AC
  Administered 2016-06-17: 10 [IU] via INTRAVENOUS
  Filled 2016-06-17: qty 1

## 2016-06-17 MED ORDER — GABAPENTIN 300 MG PO CAPS
300.0000 mg | ORAL_CAPSULE | Freq: Two times a day (BID) | ORAL | Status: DC
  Administered 2016-06-17 – 2016-06-18 (×2): 300 mg via ORAL
  Filled 2016-06-17 (×2): qty 1

## 2016-06-17 MED ORDER — ZOLPIDEM TARTRATE 5 MG PO TABS
5.0000 mg | ORAL_TABLET | Freq: Every evening | ORAL | Status: DC | PRN
  Administered 2016-06-17: 5 mg via ORAL
  Filled 2016-06-17: qty 1

## 2016-06-17 MED ORDER — HYDROCODONE-ACETAMINOPHEN 5-325 MG PO TABS: 1.0000 | ORAL_TABLET | Freq: Four times a day (QID) | ORAL | Status: DC | PRN

## 2016-06-17 MED ORDER — CARVEDILOL 12.5 MG PO TABS
12.5000 mg | ORAL_TABLET | Freq: Two times a day (BID) | ORAL | Status: DC
  Administered 2016-06-17 – 2016-06-18 (×3): 12.5 mg via ORAL
  Filled 2016-06-17 (×3): qty 1

## 2016-06-17 MED ORDER — SODIUM CHLORIDE 0.9 % IV SOLN
INTRAVENOUS | Status: DC
Start: 2016-06-17 — End: 2016-06-18
  Administered 2016-06-17 (×2): via INTRAVENOUS

## 2016-06-17 MED ORDER — PRAVASTATIN SODIUM 40 MG PO TABS
40.0000 mg | ORAL_TABLET | Freq: Every evening | ORAL | Status: DC
  Administered 2016-06-17 – 2016-06-18 (×2): 40 mg via ORAL
  Filled 2016-06-17 (×2): qty 1

## 2016-06-17 MED ORDER — ONDANSETRON HCL 4 MG/2ML IJ SOLN: 4.0000 mg | Freq: Four times a day (QID) | INTRAMUSCULAR | Status: DC | PRN

## 2016-06-17 MED ORDER — IOPAMIDOL (ISOVUE-370) INJECTION 76%
INTRAVENOUS | Status: AC
  Administered 2016-06-17: 100 mL
  Filled 2016-06-17: qty 100

## 2016-06-17 MED ORDER — MORPHINE SULFATE (PF) 4 MG/ML IV SOLN: 2.0000 mg | INTRAVENOUS | Status: DC | PRN

## 2016-06-17 MED ORDER — SODIUM CHLORIDE 0.9 % IV BOLUS (SEPSIS)
1000.0000 mL | Freq: Once | INTRAVENOUS | Status: AC
  Administered 2016-06-17: 1000 mL via INTRAVENOUS

## 2016-06-17 MED ORDER — ALPRAZOLAM 0.25 MG PO TABS: 0.2500 mg | ORAL_TABLET | Freq: Two times a day (BID) | ORAL | Status: DC | PRN

## 2016-06-17 MED ORDER — INSULIN GLARGINE 100 UNIT/ML ~~LOC~~ SOLN
35.0000 [IU] | Freq: Every day | SUBCUTANEOUS | Status: DC
  Administered 2016-06-17: 35 [IU] via SUBCUTANEOUS
  Filled 2016-06-17 (×2): qty 0.35

## 2016-06-17 MED ORDER — ENOXAPARIN SODIUM 40 MG/0.4ML ~~LOC~~ SOLN
40.0000 mg | SUBCUTANEOUS | Status: DC
  Administered 2016-06-17: 40 mg via SUBCUTANEOUS
  Filled 2016-06-17: qty 0.4

## 2016-06-17 MED ORDER — GADOBENATE DIMEGLUMINE 529 MG/ML IV SOLN
15.0000 mL | Freq: Once | INTRAVENOUS | Status: AC | PRN
  Administered 2016-06-17: 15 mL via INTRAVENOUS

## 2016-06-17 MED ORDER — ACETAMINOPHEN 325 MG PO TABS
650.0000 mg | ORAL_TABLET | ORAL | Status: DC | PRN
Start: 2016-06-17 — End: 2016-06-18

## 2016-06-17 MED ORDER — INSULIN ASPART 100 UNIT/ML ~~LOC~~ SOLN
0.0000 [IU] | SUBCUTANEOUS | Status: DC
  Administered 2016-06-17: 3 [IU] via SUBCUTANEOUS
  Administered 2016-06-18: 8 [IU] via SUBCUTANEOUS
  Administered 2016-06-18: 2 [IU] via SUBCUTANEOUS

## 2016-06-17 MED ORDER — TRAMADOL HCL 50 MG PO TABS
50.0000 mg | ORAL_TABLET | Freq: Four times a day (QID) | ORAL | Status: DC | PRN
  Administered 2016-06-17 – 2016-06-18 (×2): 50 mg via ORAL
  Filled 2016-06-17 (×2): qty 1

## 2016-06-17 NOTE — ED Notes (Signed)
Patient transported to CT 

## 2016-06-17 NOTE — ED Provider Notes (Signed)
[facility] DEPT Provider Note   CSN: [id number] Arrival date & time: 06/17/16  1303     History   Chief Complaint Chief Complaint  Patient presents with  . Chest Pain    HPI Kara Hanson is a 61 y.o. female with a past medical history of CHF, DM, HTN, HLD who presents to the ED today complaining of chest pain. Patient states that she was walking in [place] when she felt acute onset left-sided chest pain that was sharp in nature. Pain radiated to her jaw and down her left arm. She felt associated nausea, shortness of breath, diaphoresis and dizziness but did not pass out. EMS was called and she received nitroglycerin and aspirin with some relief in her pain. Patient states the chest pain is mostly resolved but now just feels like pressure. She has had similar episodes in the past. She was last admitted in January 2018 for similar chest pain. She had left heart cath at that time which did not show any coronary artery disease, ejection fraction was 50%. Patient is medically managed on an 81 mg aspirin, Coreg, pravastatin. Patient does report that her urine has been darker in color for the last couple of days. She also endorses ongoing right lower extremity burning sensation that has been present for several months. Patient was recently placed on Lasix 2 weeks ago. She denies any fevers, abdominal pain, vomiting.    HPI Past Medical History:  Diagnosis Date  . CHF (congestive heart failure) ([place])   . Diabetes mellitus without complication ([place])   . Hypercholesteremia   . Hypertension   . Spinal stenosis     Patient Active Problem List   Diagnosis Date Noted  . Stress-induced cardiomyopathy 05/19/2016  . Type 2 diabetes mellitus with diabetic neuropathy, unspecified ([place]) 05/06/2016  . Vertigo 05/06/2016  . Controlled type 2 diabetes mellitus with hyperglycemia ([place]) 04/23/2016  . Hypertension 04/23/2016  . Normochromic normocytic anemia 04/23/2016  . Syncope 04/23/2016     Past Surgical History:  Procedure Laterality Date  . BLADDER SURGERY    . CARDIAC CATHETERIZATION N/A 04/25/2016   Procedure: Left Heart Cath and Coronary Angiography;  Surgeon: Lorretta Harp, MD;  Location: [facility];  Service: Cardiovascular;  Laterality: N/A;  . CESAREAN SECTION    . CHOLECYSTECTOMY      OB History    No data available       Home Medications    Prior to Admission medications   Medication Sig Start Date End Date Taking? Authorizing Provider  aspirin EC 81 MG tablet Take 81 mg by mouth daily.    Historical Provider, MD  carvedilol (COREG) 12.5 MG tablet Take 1 tablet (12.5 mg total) by mouth 2 (two) times daily with a meal. 05/19/16   Jerline Pain, MD  gabapentin (NEURONTIN) 300 MG capsule Take 1 capsule (300 mg total) by mouth 2 (two) times daily. 05/06/16   Arnoldo Morale, MD  glucose blood (ACCU-CHEK AVIVA) test strip Use as instructed 3 times daily 05/06/16   Arnoldo Morale, MD  HYDROcodone-acetaminophen (NORCO/VICODIN) 5-325 MG tablet Take 1 tablet by mouth every 6 (six) hours as needed for moderate pain. 02/05/16   Jola Schmidt, MD  ibuprofen (ADVIL,MOTRIN) 200 MG tablet Take 600 mg by mouth every 6 (six) hours as needed for headache, mild pain or moderate pain.    Historical Provider, MD  insulin aspart (NOVOLOG) 100 UNIT/ML injection 0-12 units according to sliding scale 05/06/16   Arnoldo Morale, MD  insulin  glargine (LANTUS) 100 UNIT/ML injection Inject 0.35 mLs (35 Units total) into the skin at bedtime. 05/06/16   Arnoldo Morale, MD  Lancet Devices [facility]) lancets Use as instructed three times daily 05/06/16   Arnoldo Morale, MD  losartan (COZAAR) 50 MG tablet Take 1 tablet (50 mg total) by mouth daily. 05/19/16   Jerline Pain, MD  meclizine (ANTIVERT) 25 MG tablet Take 1 tablet (25 mg total) by mouth 2 (two) times daily as needed. 05/06/16   Arnoldo Morale, MD  pravastatin (PRAVACHOL) 40 MG tablet Take 1 tablet (40 mg total) by mouth every evening.  05/19/16   Jerline Pain, MD  traMADol (ULTRAM) 50 MG tablet Take 1 tablet (50 mg total) by mouth every 12 (twelve) hours as needed. 06/07/16   Arnoldo Morale, MD    Family History Family History  Problem Relation Age of Onset  . Diabetes Mellitus II Father   . Stroke Father     Social History Social History  Substance Use Topics  . Smoking status: Never Smoker  . Smokeless tobacco: Never Used  . Alcohol use No     Allergies   Latex and Morphine and related   Review of Systems Review of Systems  All other systems reviewed and are negative.    Physical Exam Updated Vital Signs BP (!) 139/106   Pulse (!) 144   Temp 97.8 F (36.6 C) (Oral)   Resp 16   Ht 4\' 11"  (1.499 m)   Wt 73.9 kg   SpO2 99%   BMI 32.92 kg/m   Physical Exam  Constitutional: She is oriented to person, place, and time. She appears well-developed and well-nourished. No distress.  HENT:  Head: Normocephalic and atraumatic.  Mouth/Throat: No oropharyngeal exudate.  Eyes: Conjunctivae and EOM are normal. Pupils are equal, round, and reactive to light. Right eye exhibits no discharge. Left eye exhibits no discharge. No scleral icterus.  Cardiovascular: Normal rate, regular rhythm, normal heart sounds and intact distal pulses.  Exam reveals no gallop and no friction rub.   No murmur heard. Pulmonary/Chest: Effort normal and breath sounds normal. No respiratory distress. She has no wheezes. She has no rales. She exhibits no tenderness.  Abdominal: Soft. She exhibits no distension. There is no tenderness. There is no guarding.  Musculoskeletal: Normal range of motion. She exhibits no edema.  TTP down RLE, non focal. Described as "burning".   Neurological: She is alert and oriented to person, place, and time. No cranial nerve deficit.  Skin: Skin is warm and dry. No rash noted. She is not diaphoretic. No erythema. No pallor.  Psychiatric: She has a normal mood and affect. Her behavior is normal.  Nursing  note and vitals reviewed.    ED Treatments / Results  Labs (all labs ordered are listed, but only abnormal results are displayed) Labs Reviewed  COMPREHENSIVE METABOLIC PANEL - Abnormal; Notable for the following:       Result Value   Sodium 131 (*)    Potassium 5.2 (*)    Chloride 98 (*)    Glucose, Bld 498 (*)    BUN 24 (*)    Creatinine, Ser 1.07 (*)    Total Protein 5.9 (*)    Albumin 3.4 (*)    GFR calc non Af Amer 55 (*)    All other components within normal limits  CBG MONITORING, ED - Abnormal; Notable for the following:    Glucose-Capillary 484 (*)    All other components within normal  limits  CBC  BRAIN NATRIURETIC PEPTIDE  URINALYSIS, ROUTINE W REFLEX MICROSCOPIC  I-STAT TROPOININ, ED    EKG  EKG Interpretation  Date/Time:  Friday June 17 2016 13:22:08 EDT Ventricular Rate:  143 PR Interval:    QRS Duration: 84 QT Interval:  307 QTC Calculation: 474 R Axis:   72 Text Interpretation:  Sinus tachycardia Borderline repolarization abnormality Baseline wander in lead(s) I II aVR Confirmed by Rogene Houston  MD, SCOTT [phone]) on 06/17/2016 3:07:34 PM       Radiology Dg Chest Portable 1 View  Result Date: 06/17/2016 CLINICAL DATA:  SOB, diaphoretic, nauseated, mid-left chest pain, and dizziness. Hx HTN, diabetes, and MI. EXAM: PORTABLE CHEST 1 VIEW COMPARISON:  04/23/2016 FINDINGS: Cardiac silhouette is borderline enlarged. No mediastinal or hilar masses. No evidence of adenopathy. Clear lungs. No pleural effusion or pneumothorax. The skeletal structures are intact. IMPRESSION: No active disease. Electronically Signed   By: Lajean Manes M.D.   On: 06/17/2016 14:02    Procedures Procedures (including critical care time)  Medications Ordered in ED Medications  sodium chloride 0.9 % bolus 1,000 mL (not administered)  insulin aspart (novoLOG) injection 10 Units (not administered)  iopamidol (ISOVUE-370) 76 % injection (not administered)     Initial Impression  / Assessment and Plan / ED Course  I have reviewed the triage vital signs and the nursing notes.  Pertinent labs & imaging results that were available during my care of the patient were reviewed by me and considered in my medical decision making (see chart for details).    61 year old female with history of cardiomyopathy presents to the ED today complaining of acute onset left-sided chest pain that radiated to jaw and left arm with associated diaphoresis, shortness of breath and nausea. Pain was improved after nitroglycerin and aspirin were administered in the field. On arrival to the ED, heart rate was 140 BPM. Normotensive. EKG shows sinus tachycardia. No evidence of ischemia. Troponin within normal limits. Blood sugar was elevated over 400. She was given IV fluids, insulin. Given her chest pain and significant tachycardia concern for pulmonary embolism. CT with PE study was ordered.   CT scan negative for acute PE. Tachycardia has since resolved. She has remained in normal sinus rhythm. However, given prolonged tachycardia although normal sinus rhythm with a should benefit from observation. Spoke with Dr. Allyson Sabal who will admit patient.  Final Clinical Impressions(s) / ED Diagnoses   Final diagnoses:  None    New Prescriptions New Prescriptions   No medications on file     Carlos Levering, PA-C 06/17/16 [place], MD 06/18/16 [phone]

## 2016-06-17 NOTE — ED Notes (Signed)
Hospital MD at bedside

## 2016-06-17 NOTE — ED Notes (Signed)
ED Provider at bedside. 

## 2016-06-17 NOTE — H&P (Addendum)
[facility] History and Physical  Kara Hanson VHQ:[id number] DOB: 11-Mar-1956 DOA: 06/17/2016  Referring physician:   PCP: Kristine Garbe, MD   Chief Complaint: Chest pain   HPI:  61 year old female with a history of stress-induced cardiomyopathy with an EF of 35%, uncontrolled diabetes, hypertension, dyslipidemia presents to the ER today complaining of chest pain. Patient recently lost her husband on Monday. Daughter states that she tried to get her mother to get out of the house and took her to [place]. In [place] the patient experienced acute onset of left-sided chest pain with radiation to the jaw and the left arm. Patient felt nauseous, short of breath, diaphoretic and was about to pass out. Daughter called EMS and was administered nitroglycerin and aspirin. Patient is followed by Dr. Candee Furbish, MD  cardiology. She was seen in the office on 05/19/16. On 04/23/16 she underwent cardiac catheterization because of newly discovered ejection fraction of 30-35%, started on carvedilol, brief episode of syncope at home with T wave inversion V2-3 whose catheterization showed normal coronary arteries and normal ejection fraction EF 50% . She was thought to have stress-induced cardiomyopathy.Nonsmoker, no alcohol, no drug use. Patient also complains of severe right lower extremity and right buttock pain. Pain starts in her right hip and shoots all the way to her calf . She was recently started on gabapentin by her PCP. Daughter states that patient has a history of cervical spinal stenosis as well. ED course BP (!) 139/106   Pulse (!) 144   Temp 97.8 F (36.6 C) (Oral)   Resp 16   On arrival to the ED, heart rate was 140 BPM. Normotensive. EKG shows sinus tachycardia. No evidence of ischemia. Troponin within normal limits. Blood sugar was elevated over 400.Given her chest pain and significant tachycardia concern for pulmonary embolism.  CT scan negative for acute PE. Tachycardia has since  resolved. Patient was brought in for observation for chest pain and tachycardia.      Review of Systems: negative for the following    Review of Systems: As per HPI, rest all negative.     Past Medical History:  Diagnosis Date  . CHF (congestive heart failure) ([place])   . Diabetes mellitus without complication ([place])   . Hypercholesteremia   . Hypertension   . Spinal stenosis      Past Surgical History:  Procedure Laterality Date  . BLADDER SURGERY    . CARDIAC CATHETERIZATION N/A 04/25/2016   Procedure: Left Heart Cath and Coronary Angiography;  Surgeon: Lorretta Harp, MD;  Location: [facility];  Service: Cardiovascular;  Laterality: N/A;  . CESAREAN SECTION    . CHOLECYSTECTOMY        Social History:  reports that she has never smoked. She has never used smokeless tobacco. She reports that she does not drink alcohol or use drugs.    Allergies  Allergen Reactions  . Latex Itching  . Morphine And Related Itching    Headache     Family History  Problem Relation Age of Onset  . Diabetes Mellitus II Father   . Stroke Father          Prior to Admission medications   Medication Sig Start Date End Date Taking? Authorizing Provider  aspirin EC 81 MG tablet Take 81 mg by mouth daily.    Historical Provider, MD  carvedilol (COREG) 12.5 MG tablet Take 1 tablet (12.5 mg total) by mouth 2 (two) times daily with a meal. 05/19/16   Thana Farr  Skains, MD  gabapentin (NEURONTIN) 300 MG capsule Take 1 capsule (300 mg total) by mouth 2 (two) times daily. 05/06/16   Arnoldo Morale, MD  glucose blood (ACCU-CHEK AVIVA) test strip Use as instructed 3 times daily 05/06/16   Arnoldo Morale, MD  HYDROcodone-acetaminophen (NORCO/VICODIN) 5-325 MG tablet Take 1 tablet by mouth every 6 (six) hours as needed for moderate pain. 02/05/16   Jola Schmidt, MD  ibuprofen (ADVIL,MOTRIN) 200 MG tablet Take 600 mg by mouth every 6 (six) hours as needed for headache, mild pain or moderate pain.     Historical Provider, MD  insulin aspart (NOVOLOG) 100 UNIT/ML injection 0-12 units according to sliding scale 05/06/16   Arnoldo Morale, MD  insulin glargine (LANTUS) 100 UNIT/ML injection Inject 0.35 mLs (35 Units total) into the skin at bedtime. 05/06/16   Arnoldo Morale, MD  Lancet Devices [facility]) lancets Use as instructed three times daily 05/06/16   Arnoldo Morale, MD  losartan (COZAAR) 50 MG tablet Take 1 tablet (50 mg total) by mouth daily. 05/19/16   Jerline Pain, MD  meclizine (ANTIVERT) 25 MG tablet Take 1 tablet (25 mg total) by mouth 2 (two) times daily as needed. 05/06/16   Arnoldo Morale, MD  pravastatin (PRAVACHOL) 40 MG tablet Take 1 tablet (40 mg total) by mouth every evening. 05/19/16   Jerline Pain, MD  traMADol (ULTRAM) 50 MG tablet Take 1 tablet (50 mg total) by mouth every 12 (twelve) hours as needed. 06/07/16   Arnoldo Morale, MD     Physical Exam: Vitals:   06/17/16 1415 06/17/16 1430 06/17/16 1530 06/17/16 1645  BP: 120/84 (!) 139/106 131/71 (!) 146/67  Pulse: (!) 136 (!) 144 84 73  Resp: 16 16 16 19   Temp:      TempSrc:      SpO2: 99% 99% 100% 95%  Weight:      Height:            Vitals:   06/17/16 1415 06/17/16 1430 06/17/16 1530 06/17/16 1645  BP: 120/84 (!) 139/106 131/71 (!) 146/67  Pulse: (!) 136 (!) 144 84 73  Resp: 16 16 16 19   Temp:      TempSrc:      SpO2: 99% 99% 100% 95%  Weight:      Height:       Constitutional: NAD, calm, comfortable Eyes: PERRL, lids and conjunctivae normal ENMT: Mucous membranes are moist. Posterior pharynx clear of any exudate or lesions.Normal dentition.  Neck: normal, supple, no masses, no thyromegaly Respiratory: clear to auscultation bilaterally, no wheezing, no crackles. Normal respiratory effort. No accessory muscle use.  Cardiovascular: Regular rate and rhythm, no murmurs / rubs / gallops. No extremity edema. 2+ pedal pulses. No carotid bruits.  Abdomen: no tenderness, no masses palpated. No hepatosplenomegaly.  Bowel sounds positive.  Musculoskeletal: no clubbing / cyanosis. Tender to palpation right  lower extremities. Good ROM, no contractures. Normal muscle tone.  Skin: no rashes, lesions, ulcers. No induration Neurologic: CN 2-12 grossly intact. Sensation intact, DTR normal. Strength 5/5 in all 4.  Psychiatric: Normal judgment and insight. Alert and oriented x 3. Normal mood.     Labs on Admission: I have personally reviewed following labs and imaging studies  CBC:  Recent Labs Lab 06/17/16 1334  WBC 5.9  HGB 12.7  HCT 37.5  MCV 82.8  PLT 350    Basic Metabolic Panel:  Recent Labs Lab 06/17/16 1334  NA 131*  K 5.2*  CL 98*  CO2  22  GLUCOSE 498*  BUN 24*  CREATININE 1.07*  CALCIUM 9.2    GFR: Estimated Creatinine Clearance: 48.4 mL/min (A) (by C-G formula based on SCr of 1.07 mg/dL (H)).  Liver Function Tests:  Recent Labs Lab 06/17/16 1334  AST 19  ALT 19  ALKPHOS 69  BILITOT 0.3  PROT 5.9*  ALBUMIN 3.4*   No results for input(s): LIPASE, AMYLASE in the last 168 hours. No results for input(s): AMMONIA in the last 168 hours.  Coagulation Profile: No results for input(s): INR, PROTIME in the last 168 hours. No results for input(s): DDIMER in the last 72 hours.  Cardiac Enzymes: No results for input(s): CKTOTAL, CKMB, CKMBINDEX, TROPONINI in the last 168 hours.  BNP (last 3 results) No results for input(s): PROBNP in the last 8760 hours.  HbA1C: No results for input(s): HGBA1C in the last 72 hours. Lab Results  Component Value Date   HGBA1C 12.2 05/06/2016     CBG:  Recent Labs Lab 06/17/16 1331  GLUCAP 484*    Lipid Profile: No results for input(s): CHOL, HDL, LDLCALC, TRIG, CHOLHDL, LDLDIRECT in the last 72 hours.  Thyroid Function Tests: No results for input(s): TSH, T4TOTAL, FREET4, T3FREE, THYROIDAB in the last 72 hours.  Anemia Panel: No results for input(s): VITAMINB12, FOLATE, FERRITIN, TIBC, IRON, RETICCTPCT in the last 72  hours.  Urine analysis:    Component Value Date/Time   COLORURINE STRAW (A) 06/17/2016 1507   APPEARANCEUR CLEAR 06/17/2016 1507   LABSPEC 1.020 06/17/2016 1507   PHURINE 7.0 06/17/2016 1507   GLUCOSEU >=500 (A) 06/17/2016 1507   HGBUR NEGATIVE 06/17/2016 1507   BILIRUBINUR NEGATIVE 06/17/2016 1507   KETONESUR NEGATIVE 06/17/2016 1507   PROTEINUR 100 (A) 06/17/2016 1507   NITRITE NEGATIVE 06/17/2016 1507   LEUKOCYTESUR TRACE (A) 06/17/2016 1507    Sepsis Labs: @LABRCNTIP (procalcitonin:4,lacticidven:4) )No results found for this or any previous visit (from the past 240 hour(s)).       Radiological Exams on Admission: Ct Angio Chest Pe W And/or Wo Contrast  Result Date: 06/17/2016 CLINICAL DATA:  Chest pain, tachycardia. EXAM: CT ANGIOGRAPHY CHEST WITH CONTRAST TECHNIQUE: Multidetector CT imaging of the chest was performed using the standard protocol during bolus administration of intravenous contrast. Multiplanar CT image reconstructions and MIPs were obtained to evaluate the vascular anatomy. CONTRAST:  100 cc Isovue 370 IV COMPARISON:  04/23/2016 FINDINGS: Cardiovascular: No filling defects in the pulmonary arteries to suggest pulmonary emboli. Heart is upper limits normal in size. Aorta is normal caliber. Scattered coronary artery and aortic calcifications. Mediastinum/Nodes: No mediastinal, hilar, or axillary adenopathy. Lungs/Pleura: Ground-glass dependent opacities in both lower lobes, likely atelectasis. No effusions. No confluent opacities. Upper Abdomen: Imaging into the upper abdomen shows no acute findings. Prior cholecystectomy. Musculoskeletal: Chest wall soft tissues are unremarkable. No acute bony abnormality. Review of the MIP images confirms the above findings. IMPRESSION: No evidence of pulmonary embolus. Borderline heart size. Dependent atelectasis in the lower lungs bilaterally. Coronary artery disease, aortic atherosclerosis. Electronically Signed   By: Rolm Baptise  M.D.   On: 06/17/2016 16:29   Dg Chest Portable 1 View  Result Date: 06/17/2016 CLINICAL DATA:  SOB, diaphoretic, nauseated, mid-left chest pain, and dizziness. Hx HTN, diabetes, and MI. EXAM: PORTABLE CHEST 1 VIEW COMPARISON:  04/23/2016 FINDINGS: Cardiac silhouette is borderline enlarged. No mediastinal or hilar masses. No evidence of adenopathy. Clear lungs. No pleural effusion or pneumothorax. The skeletal structures are intact. IMPRESSION: No active disease. Electronically Signed   By:  Lajean Manes M.D.   On: 06/17/2016 14:02   Ct Angio Chest Pe W And/or Wo Contrast  Result Date: 06/17/2016 CLINICAL DATA:  Chest pain, tachycardia. EXAM: CT ANGIOGRAPHY CHEST WITH CONTRAST TECHNIQUE: Multidetector CT imaging of the chest was performed using the standard protocol during bolus administration of intravenous contrast. Multiplanar CT image reconstructions and MIPs were obtained to evaluate the vascular anatomy. CONTRAST:  100 cc Isovue 370 IV COMPARISON:  04/23/2016 FINDINGS: Cardiovascular: No filling defects in the pulmonary arteries to suggest pulmonary emboli. Heart is upper limits normal in size. Aorta is normal caliber. Scattered coronary artery and aortic calcifications. Mediastinum/Nodes: No mediastinal, hilar, or axillary adenopathy. Lungs/Pleura: Ground-glass dependent opacities in both lower lobes, likely atelectasis. No effusions. No confluent opacities. Upper Abdomen: Imaging into the upper abdomen shows no acute findings. Prior cholecystectomy. Musculoskeletal: Chest wall soft tissues are unremarkable. No acute bony abnormality. Review of the MIP images confirms the above findings. IMPRESSION: No evidence of pulmonary embolus. Borderline heart size. Dependent atelectasis in the lower lungs bilaterally. Coronary artery disease, aortic atherosclerosis. Electronically Signed   By: Rolm Baptise M.D.   On: 06/17/2016 16:29   Dg Chest Portable 1 View  Result Date: 06/17/2016 CLINICAL DATA:  SOB,  diaphoretic, nauseated, mid-left chest pain, and dizziness. Hx HTN, diabetes, and MI. EXAM: PORTABLE CHEST 1 VIEW COMPARISON:  04/23/2016 FINDINGS: Cardiac silhouette is borderline enlarged. No mediastinal or hilar masses. No evidence of adenopathy. Clear lungs. No pleural effusion or pneumothorax. The skeletal structures are intact. IMPRESSION: No active disease. Electronically Signed   By: Lajean Manes M.D.   On: 06/17/2016 14:02      EKG: Independently reviewed.    Assessment/Plan Principal Problem:   Chest pain, atypical versus panic attack Patient will be admitted to telemetry Original 2-D echo showed EF of 35%, suspected to have stress-induced cardiomyopathy Cardiac catheterization in January showed normal coronaries with an EF of 50% Patient recently lost her husband and has been quite stressed out CT angio negative for PE, pneumonia We will continue to cycle cardiac enzymes Repeat 2-D echo to document improvement in EF, wall motion abnormalities Will start patient on a full dose aspirin 325 mg and maintain on Coreg      Controlled type 2 diabetes mellitus with hyperglycemia (HCC) Most recent hemoglobin A1c was 12.2, patient CBG 498, received 10 units of IV insulin, anion gap 11, bicarbonate 22, no evidence of DKA Will start patient on sliding scale insulin Continue Lantus, NovoLog     Hypertension Blood pressure control Continue home medications    Right lower extremity pain Suspect diabetic neuropathy, lumbar radiculopathy Patient agreeable to proceed with MRI of the lumbar spine She had MRI of the cervical spine 11/13/15 that showed moderate left foraminal narrowing, mild central canal stenosis Will increase dose of gabapentin, Robaxin  Hyperkalemia Hopefully will improve with administration of IV insulin, hold Cozaar Will recheck in the morning  Dyslipidemia Continue statin       DVT prophylaxis:  Lovenox      Code Status Orders full code         consults called:  Family Communication: Admission, patients condition and plan of care including tests being ordered have been discussed with the patient  who indicates understanding and agree with the plan and Code Status  Admission status: inpatient    Disposition plan: Further plan will depend as patient's clinical course evolves and further radiologic and laboratory data become available. Likely home when stable  comorbidities.   Reyne Dumas MD [facility] Pager [phone]  If 7PM-7AM, please contact night-coverage [email or web address] Password [email or web address]  06/17/2016, 5:25 PM

## 2016-06-17 NOTE — ED Notes (Signed)
CBG 188; RN aware

## 2016-06-17 NOTE — ED Notes (Signed)
EKG handed to MD

## 2016-06-17 NOTE — ED Notes (Signed)
CBG 484

## 2016-06-17 NOTE — ED Triage Notes (Signed)
Pt BIB [facility]. Pt called while out shopping r/t sudden onset left CP with radiation into jaw. Pt c/o tingling in arms and SOB. EMS placed pt on 4L [place] for comfort. Pt reports dark urine and constipation X4 days with new use of lasix starting 2 weeks ago. Negative stroke screen in field. 2 Nitro and 324 Asprin given PTA. A&OX4. CBG 456. Hx CVA, STEMI, and DM.

## 2016-06-17 NOTE — ED Notes (Signed)
Attempted report 

## 2016-06-17 NOTE — ED Provider Notes (Addendum)
Medical screening examination/treatment/procedure(s) were conducted as a shared visit with non-physician practitioner(s) and myself.  I personally evaluated the patient during the encounter.   EKG Interpretation  Date/Time:  Friday June 17 2016 13:22:08 EDT Ventricular Rate:  143 PR Interval:    QRS Duration: 84 QT Interval:  307 QTC Calculation: 474 R Axis:   72 Text Interpretation:  Sinus tachycardia Borderline repolarization abnormality Baseline wander in lead(s) I II aVR Confirmed by Marg Macmaster  MD, [place] Noyce [phone]) on 06/17/2016 3:07:34 PM       Results for orders placed or performed during the hospital encounter of 06/17/16  CBC  Result Value Ref Range   WBC 5.9 4.0 - 10.5 K/uL   RBC 4.53 3.87 - 5.11 MIL/uL   Hemoglobin 12.7 12.0 - 15.0 g/dL   HCT 37.5 36.0 - 46.0 %   MCV 82.8 78.0 - 100.0 fL   MCH 28.0 26.0 - 34.0 pg   MCHC 33.9 30.0 - 36.0 g/dL   RDW 13.2 11.5 - 15.5 %   Platelets 218 150 - 400 K/uL  Comprehensive metabolic panel  Result Value Ref Range   Sodium 131 (L) 135 - 145 mmol/L   Potassium 5.2 (H) 3.5 - 5.1 mmol/L   Chloride 98 (L) 101 - 111 mmol/L   CO2 22 22 - 32 mmol/L   Glucose, Bld 498 (H) 65 - 99 mg/dL   BUN 24 (H) 6 - 20 mg/dL   Creatinine, Ser 1.07 (H) 0.44 - 1.00 mg/dL   Calcium 9.2 8.9 - 10.3 mg/dL   Total Protein 5.9 (L) 6.5 - 8.1 g/dL   Albumin 3.4 (L) 3.5 - 5.0 g/dL   AST 19 15 - 41 U/L   ALT 19 14 - 54 U/L   Alkaline Phosphatase 69 38 - 126 U/L   Total Bilirubin 0.3 0.3 - 1.2 mg/dL   GFR calc non Af Amer 55 (L) >60 mL/min   GFR calc Af Amer >60 >60 mL/min   Anion gap 11 5 - 15  Brain natriuretic peptide  Result Value Ref Range   B Natriuretic Peptide 27.1 0.0 - 100.0 pg/mL  I-stat troponin, ED  Result Value Ref Range   Troponin i, poc 0.00 0.00 - 0.08 ng/mL   Comment 3          POC CBG, ED  Result Value Ref Range   Glucose-Capillary 484 (H) 65 - 99 mg/dL   Dg Chest Portable 1 View  Result Date: 06/17/2016 CLINICAL DATA:  SOB,  diaphoretic, nauseated, mid-left chest pain, and dizziness. Hx HTN, diabetes, and MI. EXAM: PORTABLE CHEST 1 VIEW COMPARISON:  04/23/2016 FINDINGS: Cardiac silhouette is borderline enlarged. No mediastinal or hilar masses. No evidence of adenopathy. Clear lungs. No pleural effusion or pneumothorax. The skeletal structures are intact. IMPRESSION: No active disease. Electronically Signed   By: Lajean Manes M.D.   On: 06/17/2016 14:02    Patient seen by me along with physician assistant. Patient brought in by EMS. Patient was out shopping when she gets sudden onset of left chest pain radiation to the jaw. Patient arrived here at around the 1:30 in the afternoon. Patient with persistent complaint of tingling in the arms and shortness of breath. EMS placed her on 4 L nasal cannula. EKG showed a sinus tachycardia heart rate around the 140s. Patient also was given nitroglycerin and aspirin. Patient has a history significant for stroke in the past STEMI and diabetes.  Here patient blood sugars markedly high. She had a persistent sinus  tachycardia. Occasionally it would go down to 80 or 90 going to a sinus rhythm not consistent with SVT not consistent with atrial fibrillation. Patient due to the persistent tachycardia and the chest pain, one do CT chest angiogram to rule out pulmonary Regino Schultze.  Patient's blood sugars very high in the 400s. Will need fluids for that and insulin to try to bring that down. Patient's initial troponin was normal.  Patient will most likely require admission for cardiac cardiac monitoring improved blood sugars and for cardiac rule out.  Chest x-ray was negative for any acute findings.   Fredia Sorrow, MD 06/17/16 [place], MD 06/17/16 [phone]

## 2016-06-17 NOTE — ED Notes (Signed)
Pt c/o pain in left leg (calf) on palpation and movement.

## 2016-06-18 ENCOUNTER — Inpatient Hospital Stay (HOSPITAL_COMMUNITY): Payer: Medicare Other

## 2016-06-18 DIAGNOSIS — M79606 Pain in leg, unspecified: Secondary | ICD-10-CM

## 2016-06-18 DIAGNOSIS — I10 Essential (primary) hypertension: Secondary | ICD-10-CM

## 2016-06-18 DIAGNOSIS — M25551 Pain in right hip: Secondary | ICD-10-CM

## 2016-06-18 DIAGNOSIS — I5181 Takotsubo syndrome: Secondary | ICD-10-CM

## 2016-06-18 DIAGNOSIS — I208 Other forms of angina pectoris: Secondary | ICD-10-CM

## 2016-06-18 DIAGNOSIS — R0789 Other chest pain: Secondary | ICD-10-CM

## 2016-06-18 DIAGNOSIS — R079 Chest pain, unspecified: Secondary | ICD-10-CM

## 2016-06-18 LAB — ECHOCARDIOGRAM COMPLETE
CHL CUP MV DEC (S): 211
E/e' ratio: 9.26
EWDT: 211 ms
FS: 33 % (ref 28–44)
Height: 59 in
IV/PV OW: 0.78
LA diam index: 2.13 cm/m2
LA vol: 46.3 mL
LASIZE: 37 mm
LAVOLA4C: 41 mL
LAVOLIN: 26.6 mL/m2
LDCA: 3.46 cm2
LEFT ATRIUM END SYS DIAM: 37 mm
LV E/e'average: 9.26
LV PW d: 10.9 mm — AB (ref 0.6–1.1)
LV TDI E'LATERAL: 12.1
LV TDI E'MEDIAL: 7.4
LV e' LATERAL: 12.1 cm/s
LVEEMED: 9.26
LVOT SV: 78 mL
LVOT VTI: 22.4 cm
LVOT peak grad rest: 3 mmHg
LVOT peak vel: 89.7 cm/s
LVOTD: 21 mm
Lateral S' vel: 13.9 cm/s
MV pk E vel: 112 m/s
MVPG: 5 mmHg
MVPKAVEL: 96.1 m/s
TAPSE: 22.7 mm
Weight: 2792 oz

## 2016-06-18 LAB — COMPREHENSIVE METABOLIC PANEL
ALBUMIN: 2.8 g/dL — AB (ref 3.5–5.0)
ALT: 18 U/L (ref 14–54)
ANION GAP: 8 (ref 5–15)
AST: 15 U/L (ref 15–41)
Alkaline Phosphatase: 58 U/L (ref 38–126)
BUN: 22 mg/dL — ABNORMAL HIGH (ref 6–20)
CHLORIDE: 103 mmol/L (ref 101–111)
CO2: 26 mmol/L (ref 22–32)
Calcium: 8.9 mg/dL (ref 8.9–10.3)
Creatinine, Ser: 0.76 mg/dL (ref 0.44–1.00)
GFR calc Af Amer: >60 mL/min (ref >60)
GFR calc non Af Amer: >60 mL/min (ref >60)
GLUCOSE: 109 mg/dL — AB (ref 65–99)
POTASSIUM: 3.8 mmol/L (ref 3.5–5.1)
SODIUM: 137 mmol/L (ref 135–145)
Total Bilirubin: 0.3 mg/dL (ref 0.3–1.2)
Total Protein: 5.3 g/dL — ABNORMAL LOW (ref 6.5–8.1)

## 2016-06-18 LAB — CBC
HEMATOCRIT: 34.7 % — AB (ref 36.0–46.0)
HEMOGLOBIN: 11.3 g/dL — AB (ref 12.0–15.0)
MCH: 27.5 pg (ref 26.0–34.0)
MCHC: 32.6 g/dL (ref 30.0–36.0)
MCV: 84.4 fL (ref 78.0–100.0)
Platelets: 205 10*3/uL (ref 150–400)
RBC: 4.11 MIL/uL (ref 3.87–5.11)
RDW: 13.5 % (ref 11.5–15.5)
WBC: 4.8 10*3/uL (ref 4.0–10.5)

## 2016-06-18 LAB — HIV ANTIBODY (ROUTINE TESTING W REFLEX): HIV SCREEN 4TH GENERATION: NONREACTIVE

## 2016-06-18 LAB — GLUCOSE, CAPILLARY
GLUCOSE-CAPILLARY: 196 mg/dL — AB (ref 65–99)
Glucose-Capillary: 116 mg/dL — ABNORMAL HIGH (ref 65–99)
Glucose-Capillary: 142 mg/dL — ABNORMAL HIGH (ref 65–99)
Glucose-Capillary: 268 mg/dL — ABNORMAL HIGH (ref 65–99)

## 2016-06-18 LAB — TROPONIN I

## 2016-06-18 NOTE — Progress Notes (Signed)
VASCULAR LAB PRELIMINARY  PRELIMINARY  PRELIMINARY  PRELIMINARY  Bilateral lower extremity venous duplex completed.    Preliminary report:  There is no DVT or SVT noted in the bilateral lower extremities.   Asjia Berrios, RVT 06/18/2016, 1:49 PM

## 2016-06-18 NOTE — Discharge Summary (Signed)
Physician Discharge Summary  Kara Hanson EYC:[id number] DOB: 13-Sep-1955 DOA: 06/17/2016  PCP: Kristine Garbe, MD  Admit date: 06/17/2016 Discharge date: 06/18/2016  Admitted From: Home Disposition: Home  Recommendations for Outpatient Follow-up:  1. Follow up with PCP in 1-2 weeks 2. Please obtain BMP/CBC in one week  Home Health: NA Equipment/Devices:NA  Discharge Condition: Stable CODE STATUS: Full Code Diet recommendation: Diet heart healthy/carb modified Room service appropriate? Yes; Fluid consistency: Thin Diet - low sodium heart healthy  Brief/Interim Summary: 61 year old female with a history of stress-induced cardiomyopathy with an EF of 35%, uncontrolled diabetes, hypertension, dyslipidemia presents to the ER today complaining of chest pain. Patient recently lost her husband on Monday. Daughter states that she tried to get her mother to get out of the house and took her to [place]. In [place] the patient experienced acute onset of left-sided chest pain with radiation to the jaw and the left arm. Patient felt nauseous, short of breath, diaphoretic and was about to pass out. Daughter called EMS and was administered nitroglycerin and aspirin. Patient is followed by Dr. Candee Furbish, MD cardiology. She was seen in the office on 05/19/16. On 04/23/16 she underwent cardiac catheterization because of newly discovered ejection fraction of 30-35%, started on carvedilol, brief episode of syncope at home with T wave inversion V2-3 whosecatheterization showed normal coronary arteries and normal ejection fractionEF 50%. She was thought to have stress-induced cardiomyopathy.Nonsmoker, no alcohol, no drug use. Patient also complains of severe right lower extremity and right buttock pain. Pain starts in her right hip and shoots all the way to her calf . She was recently started on gabapentin by her PCP. Daughter states that patient has a history of cervical spinal stenosis as well.  Discharge  Diagnoses:  Principal Problem:   Chest pain Active Problems:   Controlled type 2 diabetes mellitus with hyperglycemia (HCC)   Hypertension   Stress-induced cardiomyopathy   Chest pain, atypical versus panic attack Presented with atypical chest pain, admitted to telemetry. Echo on 04/23/16 showed LVEF of 35%, suspected to have stress-induced cardiomyopathy Cardiac catheterization on 04/25/16 showed normal coronaries with an EF of 50% Patient recently lost her husband and has been quite stressed out CT angio negative for PE, pneumonia 3 sets of cardiac enzymes are negative, EKG without ischemic changes. 2-D pending at the time of discharge. Cardiology evaluated the patient, recommended no further ischemic workup.  Controlled type 2 diabetes mellitus with hyperglycemia (HCC) Most recent hemoglobin A1c was 12.2, patient CBG 498, anion gap 11, bicarbonate 22, no evidence of DKA Patient morning "fasting" CBG is 109 after she had her 35 units of Lantus last night,? Compliance at home. Patient follow-up with PCP for further evaluation.  Hypertension Blood pressure control Continue home medications  Right lower extremity pain Suspect diabetic neuropathy, lumbar radiculopathy MRI of lumbar spine showed possible rotation to L4-L5 and S1 nerve roots. Doppler of right lower extremity preliminary report showed no DVT or SVT. Cannot rule out peripheral neuropathy secondary to uncontrolled diabetes mellitus type 2.  Hyperkalemia Potassium was 5.2, this is resolved after IV fluid hydration  Hyponatremia Sodium was 131, this is pseudohyponatremia if corrected to the glucose of 498.  Dyslipidemia Continue statin, needs better control of diabetes mellitus type 2  Discharge Instructions  Discharge Instructions    Diet - low sodium heart healthy    Complete by:  As directed    Increase activity slowly    Complete by:  As directed      Allergies  as of 06/18/2016      Reactions   Garlic  Shortness Of Breath, Itching, Swelling   Itching and swelling (of hands)   Latex Itching   Morphine And Related Itching   Headache (also)      Medication List    TAKE these medications   accu-chek softclix lancets Use as instructed three times daily   aspirin EC 81 MG tablet Take 81 mg by mouth daily.   carvedilol 12.5 MG tablet Commonly known as:  COREG Take 1 tablet (12.5 mg total) by mouth 2 (two) times daily with a meal.   furosemide 40 MG tablet Commonly known as:  LASIX Take 40 mg by mouth every morning.   gabapentin 300 MG capsule Commonly known as:  NEURONTIN Take 1 capsule (300 mg total) by mouth 2 (two) times daily.   glucose blood test strip Commonly known as:  ACCU-CHEK AVIVA Use as instructed 3 times daily   HYDROcodone-acetaminophen 5-325 MG tablet Commonly known as:  NORCO/VICODIN Take 1 tablet by mouth every 6 (six) hours as needed for moderate pain.   ibuprofen 200 MG tablet Commonly known as:  ADVIL,MOTRIN Take 200-600 mg by mouth every 6 (six) hours as needed for headache, mild pain or moderate pain.   insulin aspart 100 UNIT/ML injection Commonly known as:  novoLOG 0-12 units according to sliding scale What changed:  how much to take  how to take this  when to take this  additional instructions   insulin glargine 100 UNIT/ML injection Commonly known as:  LANTUS Inject 0.35 mLs (35 Units total) into the skin at bedtime.   losartan 50 MG tablet Commonly known as:  COZAAR Take 1 tablet (50 mg total) by mouth daily.   meclizine 25 MG tablet Commonly known as:  ANTIVERT Take 1 tablet (25 mg total) by mouth 2 (two) times daily as needed. What changed:  reasons to take this   pravastatin 40 MG tablet Commonly known as:  PRAVACHOL Take 1 tablet (40 mg total) by mouth every evening.   traMADol 50 MG tablet Commonly known as:  ULTRAM Take 1 tablet (50 mg total) by mouth every 12 (twelve) hours as needed. What changed:  when to take  this      Follow-up Information    REESE,BETTI D, MD Follow up in 1 week(s).   Specialty:  Family Medicine Contact information: [address] [place] [address] [phone]          Allergies  Allergen Reactions  . Garlic Shortness Of Breath, Itching and Swelling    Itching and swelling (of hands)  . Latex Itching  . Morphine And Related Itching    Headache (also)     Consultations:  Treatment Team:   Rounding L[facility]cardiology, MD  Procedures (Echo, Carotid, EGD, Colonoscopy, ERCP)   Radiological studies: Ct Angio Chest Pe W And/or Wo Contrast  Result Date: 06/17/2016 CLINICAL DATA:  Chest pain, tachycardia. EXAM: CT ANGIOGRAPHY CHEST WITH CONTRAST TECHNIQUE: Multidetector CT imaging of the chest was performed using the standard protocol during bolus administration of intravenous contrast. Multiplanar CT image reconstructions and MIPs were obtained to evaluate the vascular anatomy. CONTRAST:  100 cc Isovue 370 IV COMPARISON:  04/23/2016 FINDINGS: Cardiovascular: No filling defects in the pulmonary arteries to suggest pulmonary emboli. Heart is upper limits normal in size. Aorta is normal caliber. Scattered coronary artery and aortic calcifications. Mediastinum/Nodes: No mediastinal, hilar, or axillary adenopathy. Lungs/Pleura: Ground-glass dependent opacities in both lower lobes, likely atelectasis.  No effusions. No confluent opacities. Upper Abdomen: Imaging into the upper abdomen shows no acute findings. Prior cholecystectomy. Musculoskeletal: Chest wall soft tissues are unremarkable. No acute bony abnormality. Review of the MIP images confirms the above findings. IMPRESSION: No evidence of pulmonary embolus. Borderline heart size. Dependent atelectasis in the lower lungs bilaterally. Coronary artery disease, aortic atherosclerosis. Electronically Signed   By: Rolm Baptise M.D.   On: 06/17/2016 16:29   Mr Lumbar Spine W Wo Contrast  Result Date:  06/17/2016 CLINICAL DATA:  Initial evaluation for acute right hip and right leg pain. EXAM: MRI LUMBAR SPINE WITHOUT AND WITH CONTRAST TECHNIQUE: Multiplanar and multiecho pulse sequences of the lumbar spine were obtained without and with intravenous contrast. CONTRAST:  77mL MULTIHANCE GADOBENATE DIMEGLUMINE 529 MG/ML IV SOLN COMPARISON:  None available. FINDINGS: Segmentation: Normal segmentation. Lowest well-formed disc is labeled the L5-S1 level. Alignment: Vertebral bodies normally aligned with preservation of the normal lumbar lordosis. No listhesis. Vertebrae: Vertebral body heights are maintained. No evidence for acute or chronic fracture. Signal intensity within the vertebral body bone marrow within normal limits. No focal osseous lesions. Minimal enhancement about the bilateral L4-5 facets, greater on the left, likely degenerative. No other abnormal enhancement. Conus medullaris: Extends to the L2 level and appears normal. Paraspinal and other soft tissues: Paraspinous soft tissues within normal limits. Possible fibroid noted at the uterine fundus. Visualized visceral structures otherwise unremarkable. Disc levels: L1-2:  Unremarkable. L2-3: Mild diffuse disc bulge with disc desiccation and intervertebral disc space narrowing. Mild reactive endplate changes. Disc bulging eccentric to the left without focal disc protrusion. No significant canal or foraminal stenosis. L3-4: Diffuse degenerative disc bulge with disc desiccation and mild intervertebral disc space narrowing. Disc bulging slightly eccentric to the left without focal disc protrusion. No significant canal stenosis. Mild left foraminal narrowing. L4-5: Mild diffuse degenerative disc bulge with disc desiccation. Superimposed broad right foraminal/extraforaminal disc protrusion (series 8, image 20). Protruding disc closely approximates the exiting right L4 nerve root without frank neural impingement. Additional tiny left subarticular disc extrusion  with slight caudad migration (series 3, image 8 on sagittal sequence, series 8, image 28 on sagittal sequence. Mild facet arthrosis with ligamentum flavum hypertrophy. Small reactive effusions present within the bilateral L4-5 facets. Again, minimal enhancement about the L4-5 facets, greater on the left (series 10, image 11, likely degenerative in nature. Mild lateral recess stenosis without significant canal narrowing. L5-S1: Mild diffuse degenerative disc bulge with disc desiccation. Tiny right subarticular disc protrusion (series 3, image 6 on sagittal sequence, series 8, image 32 on axial sequence). This closely approximates the transiting right S1 nerve root without frank impingement or displacement. Additional superimposed right extraforaminal/ far lateral disc protrusion, closely approximating the exiting right L5 nerve root without frank neural impingement. Moderate bilateral facet arthrosis. Asymmetric effusion within the right L5-S1 facet, likely reactive. IMPRESSION: 1. Small right subarticular and extraforaminal disc protrusions at L5-S1, potentially irritating the transiting S1 and L5 nerve roots respectively. 2. Right foraminal/extraforaminal disc protrusion at L4-5, potentially irritating the transiting right L4 nerve root. 3. Additional small left subarticular disc protrusion at L4-5 with resultant mild left lateral recess stenosis. 4. Degenerative disc bulge at L3-4 without stenosis or neural impingement. 5. Bilateral facet arthrosis at L4-5 and L5-S1. Minimal enhancement about the L4-5 facets likely degenerative in nature. Electronically Signed   By: Jeannine Boga M.D.   On: 06/17/2016 20:51   Dg Chest Portable 1 View  Result Date: 06/17/2016 CLINICAL DATA:  SOB, diaphoretic,  nauseated, mid-left chest pain, and dizziness. Hx HTN, diabetes, and MI. EXAM: PORTABLE CHEST 1 VIEW COMPARISON:  04/23/2016 FINDINGS: Cardiac silhouette is borderline enlarged. No mediastinal or hilar masses. No  evidence of adenopathy. Clear lungs. No pleural effusion or pneumothorax. The skeletal structures are intact. IMPRESSION: No active disease. Electronically Signed   By: Lajean Manes M.D.   On: 06/17/2016 14:02     Subjective:  Discharge Exam: Vitals:   06/18/16 0039 06/18/16 0400 06/18/16 0515 06/18/16 0841  BP: 131/65  125/60 137/67  Pulse: 74  74 76  Resp: (!) 22 17 (!) 23   Temp: 98.5 F (36.9 C)  97.7 F (36.5 C) 98.5 F (36.9 C)  TempSrc: Oral  Oral Oral  SpO2: 100%  97% 100%  Weight:   79.2 kg (174 lb 8 oz)   Height:       General: Pt is alert, awake, not in acute distress Cardiovascular: RRR, S1/S2 +, no rubs, no gallops Respiratory: CTA bilaterally, no wheezing, no rhonchi Abdominal: Soft, NT, ND, bowel sounds + Extremities: no edema, no cyanosis   The results of significant diagnostics from this hospitalization (including imaging, microbiology, ancillary and laboratory) are listed below for reference.    Microbiology: No results found for this or any previous visit (from the past 240 hour(s)).   Labs: BNP (last 3 results)  Recent Labs  10/01/15 1329 06/17/16 1342  BNP 15.3 50.0   Basic Metabolic Panel:  Recent Labs Lab 06/17/16 1334 06/17/16 1720 06/18/16 0707  NA 131*  --  137  K 5.2*  --  3.8  CL 98*  --  103  CO2 22  --  26  GLUCOSE 498*  --  109*  BUN 24*  --  22*  CREATININE 1.07* 0.98 0.76  CALCIUM 9.2  --  8.9   Liver Function Tests:  Recent Labs Lab 06/17/16 1334 06/18/16 0707  AST 19 15  ALT 19 18  ALKPHOS 69 58  BILITOT 0.3 0.3  PROT 5.9* 5.3*  ALBUMIN 3.4* 2.8*   No results for input(s): LIPASE, AMYLASE in the last 168 hours. No results for input(s): AMMONIA in the last 168 hours. CBC:  Recent Labs Lab 06/17/16 1334 06/17/16 1720 06/18/16 0707  WBC 5.9 5.0 4.8  HGB 12.7 12.3 11.3*  HCT 37.5 36.6 34.7*  MCV 82.8 82.8 84.4  PLT 218 199 205   Cardiac Enzymes:  Recent Labs Lab 06/17/16 1720 06/17/16 2055  06/17/16 2259  TROPONINI <0.03 <0.03 <0.03   BNP: Invalid input(s): POCBNP CBG:  Recent Labs Lab 06/17/16 1756 06/17/16 2114 06/18/16 0016 06/18/16 0421 06/18/16 0759  GLUCAP 188* 185* 268* 142* 116*   D-Dimer No results for input(s): DDIMER in the last 72 hours. Hgb A1c No results for input(s): HGBA1C in the last 72 hours. Lipid Profile No results for input(s): CHOL, HDL, LDLCALC, TRIG, CHOLHDL, LDLDIRECT in the last 72 hours. Thyroid function studies  Recent Labs  06/17/16 1742  TSH 1.263   Anemia work up No results for input(s): VITAMINB12, FOLATE, FERRITIN, TIBC, IRON, RETICCTPCT in the last 72 hours. Urinalysis    Component Value Date/Time   COLORURINE STRAW (A) 06/17/2016 1507   APPEARANCEUR CLEAR 06/17/2016 1507   LABSPEC 1.020 06/17/2016 1507   PHURINE 7.0 06/17/2016 1507   GLUCOSEU >=500 (A) 06/17/2016 1507   HGBUR NEGATIVE 06/17/2016 1507   BILIRUBINUR NEGATIVE 06/17/2016 1507   KETONESUR NEGATIVE 06/17/2016 1507   PROTEINUR 100 (A) 06/17/2016 1507   NITRITE NEGATIVE 06/17/2016  [place] (A) 06/17/2016 1507   Sepsis Labs Invalid input(s): PROCALCITONIN,  WBC,  LACTICIDVEN Microbiology No results found for this or any previous visit (from the past 240 hour(s)).   Time coordinating discharge: Over 30 minutes  SIGNED:   Birdie Hopes, MD  [facility] 06/18/2016, 11:54 AM Pager   If 7PM-7AM, please contact night-coverage [email or web address] Password [email or web address]

## 2016-06-18 NOTE — Consult Note (Signed)
CONSULT NOTE  Date: 06/18/2016               Patient Name:  Kara Hanson MRN: [id number]  DOB: 1955-08-08 Age / Sex: 61 y.o., female        PCP: Kristine Garbe Primary Cardiologist: Marlou Porch             Referring Physician: Hartford Poli              Reason for Consult: Chest pain            History of Present Illness: Patient is a 61 y.o. female with a PMHx of stress-induced cardiomyopathy with an EF of 35%, uncontrolled diabetes, hypertension, dyslipidemia , who was admitted to [facility] on 06/17/2016 for evaluation of  Chest pain . Marland Kitchen   Patient had an admission to the hospital in January for stress-induced cardiomyopathy - Takotsubo syncrome. Catheterization at the time revealed normal coronary arteries and revealed an ejection fraction of 50%. Her husband had been quite ill at the time.  She saw Dr. Marlou Porch in the office on February 15 for an episode of vertigo/syncope. She was admitted on March 16 with chest pain. Her husband passed away 4 days prior to this admission. She's been very stressed out.  The patient started having episodes of chest pain just today while shopping. The pains were described as a pinprick-like 6 sensation and were pleuritic in nature. She had the sensation of shortness of breath and could not take a deep breath. The pain was completely different than her presenting symptoms in January. There was no exertional component. Was no syncope or presyncope. There was no sweats. The pain was not worsened by lying supine and was not eased by sitting forward. There was no radiation of the pain.  She's not had any leg edema. She has had some pain in her right leg seems to be more associated with bone pain and not necessarily calf pain.  CT and gram of the chest reveals no evidence of pulmonary embolus.  An echo cardiac gram has been ordered. She'll also be getting venous Dopplers of her legs.   Medications: Outpatient medications: Prescriptions Prior to Admission    Medication Sig Dispense Refill Last Dose  . aspirin EC 81 MG tablet Take 81 mg by mouth daily.   06/17/2016 at 0900  . carvedilol (COREG) 12.5 MG tablet Take 1 tablet (12.5 mg total) by mouth 2 (two) times daily with a meal. 180 tablet 3 06/17/2016 at 0900  . furosemide (LASIX) 40 MG tablet Take 40 mg by mouth every morning.  3 06/17/2016 at am  . gabapentin (NEURONTIN) 300 MG capsule Take 1 capsule (300 mg total) by mouth 2 (two) times daily. 60 capsule 3 06/17/2016 at am  . glucose blood (ACCU-CHEK AVIVA) test strip Use as instructed 3 times daily 100 each 12 Taking  . ibuprofen (ADVIL,MOTRIN) 200 MG tablet Take 200-600 mg by mouth every 6 (six) hours as needed for headache, mild pain or moderate pain.    PRN at PRN  . insulin aspart (NOVOLOG) 100 UNIT/ML injection 0-12 units according to sliding scale (Patient taking differently: Inject 0-12 Units into the skin 3 (three) times daily before meals. Per sliding scale) 10 mL 3 06/16/2016 at am  . insulin glargine (LANTUS) 100 UNIT/ML injection Inject 0.35 mLs (35 Units total) into the skin at bedtime. 10 mL 1 06/16/2016 at pm  . Lancet Devices (ACCU-CHEK SOFTCLIX) lancets Use as instructed three times daily 1  each 5 Taking  . losartan (COZAAR) 50 MG tablet Take 1 tablet (50 mg total) by mouth daily. 90 tablet 3 06/17/2016 at am  . meclizine (ANTIVERT) 25 MG tablet Take 1 tablet (25 mg total) by mouth 2 (two) times daily as needed. (Patient taking differently: Take 25 mg by mouth 2 (two) times daily as needed for dizziness. ) 60 tablet 1 PRN at PRN  . pravastatin (PRAVACHOL) 40 MG tablet Take 1 tablet (40 mg total) by mouth every evening. 90 tablet 3 06/16/2016 at pm  . traMADol (ULTRAM) 50 MG tablet Take 1 tablet (50 mg total) by mouth every 12 (twelve) hours as needed. (Patient taking differently: Take 50 mg by mouth at bedtime. ) 60 tablet 0 06/16/2016 at pm  . HYDROcodone-acetaminophen (NORCO/VICODIN) 5-325 MG tablet Take 1 tablet by mouth every 6 (six)  hours as needed for moderate pain. (Patient not taking: Reported on 06/17/2016) 6 tablet 0 Not Taking at Unknown time    Current medications: Current Facility-Administered Medications  Medication Dose Route Frequency Provider Last Rate Last Dose  . 0.9 %  sodium chloride infusion   Intravenous Continuous Reyne Dumas, MD 125 mL/hr at 06/18/16 0357    . acetaminophen (TYLENOL) tablet 650 mg  650 mg Oral Q4H PRN Reyne Dumas, MD      . ALPRAZolam Duanne Moron) tablet 0.25 mg  0.25 mg Oral BID PRN Reyne Dumas, MD      . aspirin EC tablet 325 mg  325 mg Oral Daily Reyne Dumas, MD      . carvedilol (COREG) tablet 12.5 mg  12.5 mg Oral BID WC Reyne Dumas, MD   12.5 mg at 06/17/16 2158  . enoxaparin (LOVENOX) injection 40 mg  40 mg Subcutaneous Q24H Reyne Dumas, MD   40 mg at 06/17/16 2158  . gabapentin (NEURONTIN) capsule 300 mg  300 mg Oral BID Reyne Dumas, MD   300 mg at 06/17/16 2158  . HYDROcodone-acetaminophen (NORCO/VICODIN) 5-325 MG per tablet 1 tablet  1 tablet Oral Q6H PRN Reyne Dumas, MD      . insulin aspart (novoLOG) injection 0-15 Units  0-15 Units Subcutaneous Q4H Reyne Dumas, MD   2 Units at 06/18/16 0447  . insulin glargine (LANTUS) injection 35 Units  35 Units Subcutaneous QHS Reyne Dumas, MD   35 Units at 06/17/16 2235  . morphine 4 MG/ML injection 2 mg  2 mg Intravenous Q2H PRN Reyne Dumas, MD      . ondansetron (ZOFRAN) injection 4 mg  4 mg Intravenous Q6H PRN Reyne Dumas, MD      . pravastatin (PRAVACHOL) tablet 40 mg  40 mg Oral QPM Reyne Dumas, MD   40 mg at 06/17/16 2158  . traMADol (ULTRAM) tablet 50 mg  50 mg Oral Q6H PRN Reyne Dumas, MD   50 mg at 06/17/16 1917  . zolpidem (AMBIEN) tablet 5 mg  5 mg Oral QHS PRN Reyne Dumas, MD   5 mg at 06/17/16 2309     Allergies  Allergen Reactions  . Garlic Shortness Of Breath, Itching and Swelling    Itching and swelling (of hands)  . Latex Itching  . Morphine And Related Itching    Headache (also)      Past Medical  History:  Diagnosis Date  . CHF (congestive heart failure) ([place])   . Diabetes mellitus without complication ([place])   . Hypercholesteremia   . Hypertension   . Spinal stenosis     Past Surgical History:  Procedure Laterality  Date  . BLADDER SURGERY    . CARDIAC CATHETERIZATION N/A 04/25/2016   Procedure: Left Heart Cath and Coronary Angiography;  Surgeon: Lorretta Harp, MD;  Location: [facility];  Service: Cardiovascular;  Laterality: N/A;  . CESAREAN SECTION    . CHOLECYSTECTOMY      Family History  Problem Relation Age of Onset  . Diabetes Mellitus II Father   . Stroke Father     Social History:  reports that she has never smoked. She has never used smokeless tobacco. She reports that she does not drink alcohol or use drugs.   Review of Systems: Constitutional:  denies fever, chills, diaphoresis, appetite change and fatigue.  HEENT: denies photophobia, eye pain, redness, hearing loss, ear pain, congestion, sore throat, rhinorrhea, sneezing, neck pain, neck stiffness and tinnitus.  Respiratory: admits to SOB,  Cardiovascular: admits to chest pain ( pleuretic , pin prick pain )   Gastrointestinal: denies nausea, vomiting, abdominal pain, diarrhea, constipation, blood in stool.  Genitourinary: denies dysuria, urgency, frequency, hematuria, flank pain and difficulty urinating.  Musculoskeletal: denies  myalgias, back pain, joint swelling, arthralgias and gait problem.   Skin: denies pallor, rash and wound.  Neurological: admits to dizziness, seizures, syncope, weakness, light-headedness, numbness and headaches.   Hematological: denies adenopathy, easy bruising, personal or family bleeding history.  Psychiatric/ Behavioral: denies suicidal ideation, mood changes, confusion, nervousness, sleep disturbance and agitation.    Physical Exam: BP 125/60 (BP Location: Right Arm)   Pulse 74   Temp 97.7 F (36.5 C) (Oral)   Resp (!) 23   Ht 4\' 11"  (1.499 m)   Wt 174 lb 8  oz (79.2 kg)   SpO2 97%   BMI 35.24 kg/m   Wt Readings from Last 3 Encounters:  06/18/16 174 lb 8 oz (79.2 kg)  05/19/16 167 lb 3.2 oz (75.8 kg)  05/06/16 165 lb (74.8 kg)    General: Vital signs reviewed and noted. Well-developed, well-nourished, in no acute distress; alert,   Head: Normocephalic, atraumatic, sclera anicteric,   Neck: Supple. Negative for carotid bruits. No JVD   Lungs:  Clear bilaterally, no  wheezes, rales, or rhonchi. Breathing is normal.  No chest wall tenderness.    Heart: RRR with S1 S2. No murmurs, rubs, or gallops   Abdomen/ GI :  Soft, non-tender, non-distended with normoactive bowel sounds. No hepatomegaly. No rebound/guarding. No obvious abdominal masses   MSK: Strength and the appear normal for age.   Extremities: No clubbing or cyanosis. No edema.  Distal pedal pulses are 2+ and equal   Neurologic:  CN are grossly intact,  No obvious motor or sensory defect.  Alert and oriented X 3. Moves all extremities spontaneously.  Psych: Responds to questions appropriately with a normal affect.     Lab results: Basic Metabolic Panel:  Recent Labs Lab 06/17/16 1334 06/17/16 1720 06/18/16 0707  NA 131*  --  137  K 5.2*  --  3.8  CL 98*  --  103  CO2 22  --  26  GLUCOSE 498*  --  109*  BUN 24*  --  22*  CREATININE 1.07* 0.98 0.76  CALCIUM 9.2  --  8.9    Liver Function Tests:  Recent Labs Lab 06/17/16 1334 06/18/16 0707  AST 19 15  ALT 19 18  ALKPHOS 69 58  BILITOT 0.3 0.3  PROT 5.9* 5.3*  ALBUMIN 3.4* 2.8*   No results for input(s): LIPASE, AMYLASE in the last 168 hours. No  results for input(s): AMMONIA in the last 168 hours.  CBC:  Recent Labs Lab 06/17/16 1334 06/17/16 1720 06/18/16 0707  WBC 5.9 5.0 4.8  HGB 12.7 12.3 11.3*  HCT 37.5 36.6 34.7*  MCV 82.8 82.8 84.4  PLT 218 199 205    Cardiac Enzymes:  Recent Labs Lab 06/17/16 1720 06/17/16 2055 06/17/16 2259  TROPONINI <0.03 <0.03 <0.03    BNP: Invalid input(s):  POCBNP  CBG:  Recent Labs Lab 06/17/16 1756 06/17/16 2114 06/18/16 0016 06/18/16 0421 06/18/16 0759  GLUCAP 188* 185* 268* 142* 116*    Coagulation Studies: No results for input(s): LABPROT, INR in the last 72 hours.   Other results:  Personal review of EKG shows :  Sinus tach,  No ST or T WAve changes    Imaging: Ct Angio Chest Pe W And/or Wo Contrast  Result Date: 06/17/2016 CLINICAL DATA:  Chest pain, tachycardia. EXAM: CT ANGIOGRAPHY CHEST WITH CONTRAST TECHNIQUE: Multidetector CT imaging of the chest was performed using the standard protocol during bolus administration of intravenous contrast. Multiplanar CT image reconstructions and MIPs were obtained to evaluate the vascular anatomy. CONTRAST:  100 cc Isovue 370 IV COMPARISON:  04/23/2016 FINDINGS: Cardiovascular: No filling defects in the pulmonary arteries to suggest pulmonary emboli. Heart is upper limits normal in size. Aorta is normal caliber. Scattered coronary artery and aortic calcifications. Mediastinum/Nodes: No mediastinal, hilar, or axillary adenopathy. Lungs/Pleura: Ground-glass dependent opacities in both lower lobes, likely atelectasis. No effusions. No confluent opacities. Upper Abdomen: Imaging into the upper abdomen shows no acute findings. Prior cholecystectomy. Musculoskeletal: Chest wall soft tissues are unremarkable. No acute bony abnormality. Review of the MIP images confirms the above findings. IMPRESSION: No evidence of pulmonary embolus. Borderline heart size. Dependent atelectasis in the lower lungs bilaterally. Coronary artery disease, aortic atherosclerosis. Electronically Signed   By: Rolm Baptise M.D.   On: 06/17/2016 16:29   Mr Lumbar Spine W Wo Contrast  Result Date: 06/17/2016 CLINICAL DATA:  Initial evaluation for acute right hip and right leg pain. EXAM: MRI LUMBAR SPINE WITHOUT AND WITH CONTRAST TECHNIQUE: Multiplanar and multiecho pulse sequences of the lumbar spine were obtained without  and with intravenous contrast. CONTRAST:  2mL MULTIHANCE GADOBENATE DIMEGLUMINE 529 MG/ML IV SOLN COMPARISON:  None available. FINDINGS: Segmentation: Normal segmentation. Lowest well-formed disc is labeled the L5-S1 level. Alignment: Vertebral bodies normally aligned with preservation of the normal lumbar lordosis. No listhesis. Vertebrae: Vertebral body heights are maintained. No evidence for acute or chronic fracture. Signal intensity within the vertebral body bone marrow within normal limits. No focal osseous lesions. Minimal enhancement about the bilateral L4-5 facets, greater on the left, likely degenerative. No other abnormal enhancement. Conus medullaris: Extends to the L2 level and appears normal. Paraspinal and other soft tissues: Paraspinous soft tissues within normal limits. Possible fibroid noted at the uterine fundus. Visualized visceral structures otherwise unremarkable. Disc levels: L1-2:  Unremarkable. L2-3: Mild diffuse disc bulge with disc desiccation and intervertebral disc space narrowing. Mild reactive endplate changes. Disc bulging eccentric to the left without focal disc protrusion. No significant canal or foraminal stenosis. L3-4: Diffuse degenerative disc bulge with disc desiccation and mild intervertebral disc space narrowing. Disc bulging slightly eccentric to the left without focal disc protrusion. No significant canal stenosis. Mild left foraminal narrowing. L4-5: Mild diffuse degenerative disc bulge with disc desiccation. Superimposed broad right foraminal/extraforaminal disc protrusion (series 8, image 20). Protruding disc closely approximates the exiting right L4 nerve root without frank neural impingement. Additional tiny left  subarticular disc extrusion with slight caudad migration (series 3, image 8 on sagittal sequence, series 8, image 28 on sagittal sequence. Mild facet arthrosis with ligamentum flavum hypertrophy. Small reactive effusions present within the bilateral L4-5  facets. Again, minimal enhancement about the L4-5 facets, greater on the left (series 10, image 11, likely degenerative in nature. Mild lateral recess stenosis without significant canal narrowing. L5-S1: Mild diffuse degenerative disc bulge with disc desiccation. Tiny right subarticular disc protrusion (series 3, image 6 on sagittal sequence, series 8, image 32 on axial sequence). This closely approximates the transiting right S1 nerve root without frank impingement or displacement. Additional superimposed right extraforaminal/ far lateral disc protrusion, closely approximating the exiting right L5 nerve root without frank neural impingement. Moderate bilateral facet arthrosis. Asymmetric effusion within the right L5-S1 facet, likely reactive. IMPRESSION: 1. Small right subarticular and extraforaminal disc protrusions at L5-S1, potentially irritating the transiting S1 and L5 nerve roots respectively. 2. Right foraminal/extraforaminal disc protrusion at L4-5, potentially irritating the transiting right L4 nerve root. 3. Additional small left subarticular disc protrusion at L4-5 with resultant mild left lateral recess stenosis. 4. Degenerative disc bulge at L3-4 without stenosis or neural impingement. 5. Bilateral facet arthrosis at L4-5 and L5-S1. Minimal enhancement about the L4-5 facets likely degenerative in nature. Electronically Signed   By: Jeannine Boga M.D.   On: 06/17/2016 20:51   Dg Chest Portable 1 View  Result Date: 06/17/2016 CLINICAL DATA:  SOB, diaphoretic, nauseated, mid-left chest pain, and dizziness. Hx HTN, diabetes, and MI. EXAM: PORTABLE CHEST 1 VIEW COMPARISON:  04/23/2016 FINDINGS: Cardiac silhouette is borderline enlarged. No mediastinal or hilar masses. No evidence of adenopathy. Clear lungs. No pleural effusion or pneumothorax. The skeletal structures are intact. IMPRESSION: No active disease. Electronically Signed   By: Lajean Manes M.D.   On: 06/17/2016 14:02        Assessment & Plan:  1. Chest pain: Her chest pain is atypical for coronary ischemia. It is more pleuritic in nature. She had a heart catheterization in January which was normal. She did have Takotsubo syndrome.  Cardiac enzymes are negative at present. At this point I do not think that she needs any further ischemic workup. I would like to get an echocardiogram to make sure  that she does not have recurrent Takotsubo  syndrome. Her husband passed away 5 days ago and she so she is still under quite a bit of extra stress.  If her echocardiogram reveals no significant changes in her left ventricle systolic function then she should be able to come later today.  2. Pleuritic chest pain: Her CT and gram of the lungs revealed no evidence of pulmonary embolus. Her symptoms are not consistent with pericarditis and her EKG does not show any signs or symptoms of pericarditis. Further workup will be per the internal medicine team.  We will sign off. Call for questions     Ramond Dial., MD, [facility] 06/18/2016, 8:26 AM Office - [phone] Pager [phone]

## 2016-06-18 NOTE — Progress Notes (Signed)
  Echocardiogram 2D Echocardiogram has been performed.  Kara Hanson 06/18/2016, 2:26 PM

## 2016-07-14 ENCOUNTER — Ambulatory Visit: Payer: Medicaid Other

## 2016-08-25 ENCOUNTER — Other Ambulatory Visit: Payer: Self-pay | Admitting: Family Medicine

## 2016-08-25 DIAGNOSIS — I5022 Chronic systolic (congestive) heart failure: Secondary | ICD-10-CM

## 2016-08-25 DIAGNOSIS — E1140 Type 2 diabetes mellitus with diabetic neuropathy, unspecified: Secondary | ICD-10-CM

## 2016-08-25 DIAGNOSIS — Z794 Long term (current) use of insulin: Principal | ICD-10-CM

## 2016-09-30 ENCOUNTER — Emergency Department (HOSPITAL_COMMUNITY): Payer: Medicare Other

## 2016-09-30 ENCOUNTER — Observation Stay (HOSPITAL_COMMUNITY)
Admission: EM | Admit: 2016-09-30 | Discharge: 2016-10-01 | Disposition: A | Discharge status: Home / Self Care | Payer: Medicare Other | Attending: Internal Medicine | Admitting: Internal Medicine

## 2016-09-30 ENCOUNTER — Encounter (HOSPITAL_COMMUNITY): Payer: Self-pay | Admitting: Emergency Medicine

## 2016-09-30 DIAGNOSIS — Z8673 Personal history of transient ischemic attack (TIA), and cerebral infarction without residual deficits: Secondary | ICD-10-CM | POA: Insufficient documentation

## 2016-09-30 DIAGNOSIS — E1165 Type 2 diabetes mellitus with hyperglycemia: Secondary | ICD-10-CM | POA: Diagnosis not present

## 2016-09-30 DIAGNOSIS — R1013 Epigastric pain: Secondary | ICD-10-CM | POA: Diagnosis not present

## 2016-09-30 DIAGNOSIS — E1169 Type 2 diabetes mellitus with other specified complication: Secondary | ICD-10-CM

## 2016-09-30 DIAGNOSIS — R0789 Other chest pain: Principal | ICD-10-CM | POA: Insufficient documentation

## 2016-09-30 DIAGNOSIS — E7800 Pure hypercholesterolemia, unspecified: Secondary | ICD-10-CM | POA: Insufficient documentation

## 2016-09-30 DIAGNOSIS — I700 Atherosclerosis of aorta: Secondary | ICD-10-CM | POA: Diagnosis not present

## 2016-09-30 DIAGNOSIS — Z794 Long term (current) use of insulin: Secondary | ICD-10-CM | POA: Insufficient documentation

## 2016-09-30 DIAGNOSIS — I10 Essential (primary) hypertension: Secondary | ICD-10-CM | POA: Diagnosis not present

## 2016-09-30 DIAGNOSIS — E1100 Type 2 diabetes mellitus with hyperosmolarity without nonketotic hyperglycemic-hyperosmolar coma (NKHHC): Secondary | ICD-10-CM

## 2016-09-30 DIAGNOSIS — Z79899 Other long term (current) drug therapy: Secondary | ICD-10-CM | POA: Diagnosis not present

## 2016-09-30 DIAGNOSIS — I252 Old myocardial infarction: Secondary | ICD-10-CM | POA: Diagnosis not present

## 2016-09-30 DIAGNOSIS — R0602 Shortness of breath: Secondary | ICD-10-CM | POA: Diagnosis not present

## 2016-09-30 DIAGNOSIS — E785 Hyperlipidemia, unspecified: Secondary | ICD-10-CM

## 2016-09-30 DIAGNOSIS — Z7982 Long term (current) use of aspirin: Secondary | ICD-10-CM | POA: Diagnosis not present

## 2016-09-30 DIAGNOSIS — N179 Acute kidney failure, unspecified: Secondary | ICD-10-CM | POA: Diagnosis not present

## 2016-09-30 DIAGNOSIS — E1140 Type 2 diabetes mellitus with diabetic neuropathy, unspecified: Secondary | ICD-10-CM | POA: Diagnosis present

## 2016-09-30 DIAGNOSIS — K219 Gastro-esophageal reflux disease without esophagitis: Secondary | ICD-10-CM | POA: Diagnosis not present

## 2016-09-30 DIAGNOSIS — I5181 Takotsubo syndrome: Secondary | ICD-10-CM | POA: Diagnosis not present

## 2016-09-30 DIAGNOSIS — R079 Chest pain, unspecified: Secondary | ICD-10-CM | POA: Diagnosis not present

## 2016-09-30 HISTORY — DX: Hyperlipidemia, unspecified: E78.5

## 2016-09-30 HISTORY — DX: Acute myocardial infarction, unspecified: I21.9

## 2016-09-30 HISTORY — DX: Cerebral infarction, unspecified: I63.9

## 2016-09-30 LAB — CBC
HCT: 37.1 % (ref 36.0–46.0)
HEMOGLOBIN: 12.6 g/dL (ref 12.0–15.0)
MCH: 27.7 pg (ref 26.0–34.0)
MCHC: 34 g/dL (ref 30.0–36.0)
MCV: 81.5 fL (ref 78.0–100.0)
Platelets: 234 10*3/uL (ref 150–400)
RBC: 4.55 MIL/uL (ref 3.87–5.11)
RDW: 13.2 % (ref 11.5–15.5)
WBC: 5.4 10*3/uL (ref 4.0–10.5)

## 2016-09-30 LAB — I-STAT VENOUS BLOOD GAS, ED
Acid-base deficit: 3 mmol/L — ABNORMAL HIGH (ref 0.0–2.0)
Bicarbonate: 18.4 mmol/L — ABNORMAL LOW (ref 20.0–28.0)
O2 SAT: 78 %
PCO2 VEN: 24.6 mmHg — AB (ref 44.0–60.0)
PO2 VEN: 38 mmHg (ref 32.0–45.0)
Patient temperature: 98.2
TCO2: 19 mmol/L (ref 0–100)
pH, Ven: 7.481 — ABNORMAL HIGH (ref 7.250–7.430)

## 2016-09-30 LAB — I-STAT CHEM 8, ED
BUN: 33 mg/dL — ABNORMAL HIGH (ref 6–20)
CHLORIDE: 98 mmol/L — AB (ref 101–111)
Calcium, Ion: 1.11 mmol/L — ABNORMAL LOW (ref 1.15–1.40)
Creatinine, Ser: 1.1 mg/dL — ABNORMAL HIGH (ref 0.44–1.00)
Glucose, Bld: 523 mg/dL (ref 65–99)
HEMATOCRIT: 37 % (ref 36.0–46.0)
Hemoglobin: 12.6 g/dL (ref 12.0–15.0)
POTASSIUM: 3.8 mmol/L (ref 3.5–5.1)
SODIUM: 131 mmol/L — AB (ref 135–145)
TCO2: 20 mmol/L (ref 0–100)

## 2016-09-30 LAB — LIPASE, BLOOD: Lipase: 31 U/L (ref 11–51)

## 2016-09-30 LAB — COMPREHENSIVE METABOLIC PANEL
ALBUMIN: 3.2 g/dL — AB (ref 3.5–5.0)
ALK PHOS: 67 U/L (ref 38–126)
ALT: 20 U/L (ref 14–54)
AST: 22 U/L (ref 15–41)
Anion gap: 13 (ref 5–15)
BUN: 32 mg/dL — ABNORMAL HIGH (ref 6–20)
CALCIUM: 9.2 mg/dL (ref 8.9–10.3)
CO2: 18 mmol/L — ABNORMAL LOW (ref 22–32)
CREATININE: 1.29 mg/dL — AB (ref 0.44–1.00)
Chloride: 97 mmol/L — ABNORMAL LOW (ref 101–111)
GFR calc Af Amer: 51 mL/min — ABNORMAL LOW (ref >60)
GFR calc non Af Amer: 44 mL/min — ABNORMAL LOW (ref >60)
GLUCOSE: 515 mg/dL — AB (ref 65–99)
Potassium: 3.8 mmol/L (ref 3.5–5.1)
SODIUM: 128 mmol/L — AB (ref 135–145)
Total Bilirubin: 0.4 mg/dL (ref 0.3–1.2)
Total Protein: 6 g/dL — ABNORMAL LOW (ref 6.5–8.1)

## 2016-09-30 LAB — I-STAT TROPONIN, ED
Troponin i, poc: 0.02 ng/mL (ref 0.00–0.08)
Troponin i, poc: 0.02 ng/mL (ref 0.00–0.08)

## 2016-09-30 LAB — CBG MONITORING, ED: Glucose-Capillary: 493 mg/dL — ABNORMAL HIGH (ref 65–99)

## 2016-09-30 MED ORDER — LORAZEPAM 2 MG/ML IJ SOLN
0.5000 mg | Freq: Once | INTRAMUSCULAR | Status: AC
  Administered 2016-09-30: 0.5 mg via INTRAVENOUS
  Filled 2016-09-30: qty 1

## 2016-09-30 MED ORDER — SODIUM CHLORIDE 0.9 % IV SOLN
INTRAVENOUS | Status: DC
  Filled 2016-09-30: qty 1

## 2016-09-30 MED ORDER — DEXTROSE-NACL 5-0.45 % IV SOLN: INTRAVENOUS | Status: DC

## 2016-09-30 MED ORDER — IOPAMIDOL (ISOVUE-370) INJECTION 76%
INTRAVENOUS | Status: AC
  Administered 2016-09-30: 80 mL
  Filled 2016-09-30: qty 100

## 2016-09-30 MED ORDER — HYDROMORPHONE HCL 1 MG/ML IJ SOLN
0.5000 mg | Freq: Once | INTRAMUSCULAR | Status: AC
  Administered 2016-09-30: 0.5 mg via INTRAVENOUS
  Filled 2016-09-30: qty 0.5

## 2016-09-30 MED ORDER — LACTATED RINGERS IV BOLUS (SEPSIS)
2000.0000 mL | Freq: Once | INTRAVENOUS | Status: AC
  Administered 2016-09-30: 2000 mL via INTRAVENOUS

## 2016-09-30 MED ORDER — ASPIRIN 81 MG PO CHEW
324.0000 mg | CHEWABLE_TABLET | Freq: Once | ORAL | Status: AC
  Administered 2016-09-30: 324 mg via ORAL
  Filled 2016-09-30: qty 4

## 2016-09-30 NOTE — ED Triage Notes (Signed)
Per [facility], Pt reports chest pain starting last night around 5 pm. Pt reports she later went to bed and felt fine. Pt woke up at 12 pm and starting having chest pain again. Pt complains of some dizziness and SHOB. Pt has hx of DM, MI and CVA. Pt lung sounds clear, O2 sats 98% on room air. Pt breathing shallow and tachypneic. EMS reports CBG 513, they gave 500 ml NS, reports CBG increased to 570. Pt responds to voice, oriented x4. Pt has some tremors and difficulty with fine motor skills upon arriving to room.

## 2016-09-30 NOTE — ED Notes (Signed)
Patient transported to X-ray 

## 2016-09-30 NOTE — ED Notes (Signed)
Dr Tyrone Nine given a copy of Chem 8 results

## 2016-09-30 NOTE — ED Notes (Signed)
Dr Tyrone Nine given a copy of venous blood gas results

## 2016-09-30 NOTE — ED Provider Notes (Signed)
[facility] DEPT Provider Note   CSN: [id number] Arrival date & time: 09/30/16  1833     History   Chief Complaint Chief Complaint  Patient presents with  . Chest Pain    HPI Kara Hanson is a 61 y.o. female.  61 yo F with a chief complaint of chest pain. The started earlier today. It's exertional. Associated with shortness of breath and diaphoresis. She denies vomiting. Denies radiation of the pain. She is at one point she tried to walk to the refrigerator and became very short of breath and had to take a break. She felt like she is to pass out. She then dialed 911. When EMS arrived the patient started having some epigastric abdominal pain. Denies vomiting.   The history is provided by the patient.  Chest Pain   This is a new problem. The current episode started 6 to 12 hours ago. The problem occurs constantly. The problem has not changed since onset.The pain is at a severity of 9/10. The pain is severe. The quality of the pain is described as exertional, pressure-like and sharp. The pain does not radiate. Duration of episode(s) is 5 hours. Associated symptoms include diaphoresis, dizziness and shortness of breath. Pertinent negatives include no fever, no headaches, no nausea, no palpitations and no vomiting. She has tried rest for the symptoms. The treatment provided moderate relief.  Her past medical history is significant for diabetes, hyperlipidemia and hypertension.  Her family medical history is significant for early MI.    Past Medical History:  Diagnosis Date  . CHF (congestive heart failure) ([place])   . CVA (cerebral vascular accident) (Kara Hanson)   . Diabetes mellitus without complication (Kara Hanson)   . Hypercholesteremia   . Hypertension   . Myocardial infarction (Kara Hanson)   . Spinal stenosis     Patient Active Problem List   Diagnosis Date Noted  . HLD (hyperlipidemia) 09/30/2016  . Chest pain 06/17/2016  . Stress-induced cardiomyopathy 05/19/2016  . Type 2 diabetes  mellitus with diabetic neuropathy, unspecified (Kara Hanson) 05/06/2016  . Vertigo 05/06/2016  . Controlled type 2 diabetes mellitus with hyperglycemia (Kara Hanson) 04/23/2016  . Hypertension 04/23/2016  . Normochromic normocytic anemia 04/23/2016  . Syncope 04/23/2016    Past Surgical History:  Procedure Laterality Date  . BLADDER SURGERY    . CARDIAC CATHETERIZATION N/A 04/25/2016   Procedure: Left Heart Cath and Coronary Angiography;  Surgeon: Lorretta Harp, MD;  Location: [facility];  Service: Cardiovascular;  Laterality: N/A;  . CESAREAN SECTION    . CHOLECYSTECTOMY      OB History    No data available       Home Medications    Prior to Admission medications   Medication Sig Start Date End Date Taking? Authorizing Provider  aspirin EC 81 MG tablet Take 81 mg by mouth daily.   Yes [provider]  carvedilol (COREG) 12.5 MG tablet Take 1 tablet (12.5 mg total) by mouth 2 (two) times daily with a meal. 05/19/16  Yes Jerline Pain, MD  furosemide (LASIX) 40 MG tablet Take 1 tablet (40 mg total) by mouth daily. 08/26/16  Yes Arnoldo Morale, MD  gabapentin (NEURONTIN) 300 MG capsule Take 1 capsule (300 mg total) by mouth 2 (two) times daily. 08/26/16  Yes Arnoldo Morale, MD  ibuprofen (ADVIL,MOTRIN) 200 MG tablet Take 200-600 mg by mouth every 6 (six) hours as needed for headache, mild pain or moderate pain.    Yes [provider]  insulin aspart (NOVOLOG)  100 UNIT/ML injection 0-12 units according to sliding scale Patient taking differently: Inject 10 Units into the skin 3 (three) times daily before meals.  05/06/16  Yes Arnoldo Morale, MD  insulin glargine (LANTUS) 100 UNIT/ML injection Inject 0.35 mLs (35 Units total) into the skin at bedtime. 05/06/16  Yes Arnoldo Morale, MD  losartan (COZAAR) 50 MG tablet Take 1 tablet (50 mg total) by mouth daily. 05/19/16  Yes Jerline Pain, MD  meclizine (ANTIVERT) 25 MG tablet Take 1 tablet (25 mg total) by mouth 2 (two) times daily as  needed. Patient taking differently: Take 25 mg by mouth 2 (two) times daily as needed (vertigo).  05/06/16  Yes Arnoldo Morale, MD  Multiple Vitamin (MULTIVITAMIN WITH MINERALS) TABS tablet Take 1 tablet by mouth daily. Women's One a Day   Yes [provider]  pravastatin (PRAVACHOL) 40 MG tablet Take 1 tablet (40 mg total) by mouth every evening. Patient taking differently: Take 40 mg by mouth at bedtime.  05/19/16  Yes Jerline Pain, MD  glucose blood (ACCU-CHEK AVIVA) test strip Use as instructed 3 times daily 05/06/16   Arnoldo Morale, MD  HYDROcodone-acetaminophen (NORCO/VICODIN) 5-325 MG tablet Take 1 tablet by mouth every 6 (six) hours as needed for moderate pain. Patient not taking: Reported on 06/17/2016 02/05/16   Jola Schmidt, MD  Lancet Devices [facility]) lancets Use as instructed three times daily 05/06/16   Arnoldo Morale, MD  traMADol (ULTRAM) 50 MG tablet Take 1 tablet (50 mg total) by mouth every 12 (twelve) hours as needed. Patient not taking: Reported on 09/30/2016 06/07/16   Arnoldo Morale, MD    Family History Family History  Problem Relation Age of Onset  . Diabetes Mellitus II Father   . Stroke Father     Social History Social History  Substance Use Topics  . Smoking status: Never Smoker  . Smokeless tobacco: Never Used  . Alcohol use No     Allergies   Garlic; Latex; Morphine and related; and Other   Review of Systems Review of Systems  Constitutional: Positive for diaphoresis. Negative for chills and fever.  HENT: Negative for congestion and rhinorrhea.   Eyes: Negative for redness and visual disturbance.  Respiratory: Positive for shortness of breath. Negative for wheezing.   Cardiovascular: Positive for chest pain. Negative for palpitations.  Gastrointestinal: Negative for nausea and vomiting.  Genitourinary: Negative for dysuria and urgency.  Musculoskeletal: Negative for arthralgias and myalgias.  Skin: Negative for pallor and wound.    Neurological: Positive for dizziness. Negative for headaches.     Physical Exam Updated Vital Signs BP (!) 165/78   Pulse 76   Temp 98.2 F (36.8 C)   Resp 14   SpO2 100%   Physical Exam  Constitutional: She is oriented to person, place, and time. She appears well-developed and well-nourished. No distress.  HENT:  Head: Normocephalic and atraumatic.  Eyes: EOM are normal. Pupils are equal, round, and reactive to light.  Neck: Normal range of motion. Neck supple.  Cardiovascular: Normal rate and regular rhythm.  Exam reveals no gallop and no friction rub.   No murmur heard. Pulmonary/Chest: Effort normal. She has no wheezes. She has no rales.  Abdominal: Soft. She exhibits no distension. There is no tenderness.  Musculoskeletal: She exhibits no edema or tenderness.  Neurological: She is alert and oriented to person, place, and time.  Skin: Skin is warm and dry. She is not diaphoretic.  Psychiatric: She has a normal mood and  affect. Her behavior is normal.  Nursing note and vitals reviewed.    ED Treatments / Results  Labs (all labs ordered are listed, but only abnormal results are displayed) Labs Reviewed  COMPREHENSIVE METABOLIC PANEL - Abnormal; Notable for the following:       Result Value   Sodium 128 (*)    Chloride 97 (*)    CO2 18 (*)    Glucose, Bld 515 (*)    BUN 32 (*)    Creatinine, Ser 1.29 (*)    Total Protein 6.0 (*)    Albumin 3.2 (*)    GFR calc non Af Amer 44 (*)    GFR calc Af Amer 51 (*)    All other components within normal limits  CBG MONITORING, ED - Abnormal; Notable for the following:    Glucose-Capillary 493 (*)    All other components within normal limits  I-STAT CHEM 8, ED - Abnormal; Notable for the following:    Sodium 131 (*)    Chloride 98 (*)    BUN 33 (*)    Creatinine, Ser 1.10 (*)    Glucose, Bld 523 (*)    Calcium, Ion 1.11 (*)    All other components within normal limits  I-STAT VENOUS BLOOD GAS, ED - Abnormal; Notable  for the following:    pH, Ven 7.481 (*)    pCO2, Ven 24.6 (*)    Bicarbonate 18.4 (*)    Acid-base deficit 3.0 (*)    All other components within normal limits  CBC  LIPASE, BLOOD  BLOOD GAS, VENOUS  I-STAT TROPOININ, ED  I-STAT TROPOININ, ED    EKG  EKG Interpretation  Date/Time:  Friday September 30 2016 18:45:35 EDT Ventricular Rate:  86 PR Interval:    QRS Duration: 92 QT Interval:  365 QTC Calculation: 437 R Axis:   46 Text Interpretation:  Sinus rhythm Abnormal T, consider ischemia, diffuse leads No significant change since last tracing Confirmed by Deno Etienne [phone]) on 09/30/2016 6:59:04 PM       Radiology Dg Chest 2 View  Result Date: 09/30/2016 CLINICAL DATA:  61 year old female with mid chest pain today. Shortness of breath since yesterday. Dizziness. EXAM: CHEST  2 VIEW COMPARISON:  Chest CTA 06/17/2016 and earlier. FINDINGS: AP and lateral views of the chest. Stable lung volumes and mild eventration of the right hemidiaphragm. Stable borderline to mild cardiomegaly. Other mediastinal contours are within normal limits. Visualized tracheal air column is within normal limits. No pneumothorax, pulmonary edema, pleural effusion or confluent pulmonary opacity. No acute osseous abnormality identified. Stable cholecystectomy clips. Negative visible bowel gas pattern. IMPRESSION: No acute cardiopulmonary abnormality. Electronically Signed   By: Genevie Ann M.D.   On: 09/30/2016 20:05   Ct Angio Chest Pe W And/or Wo Contrast  Result Date: 09/30/2016 CLINICAL DATA:  Chest pain, near syncope and tachypnea. EXAM: CT ANGIOGRAPHY CHEST CT ABDOMEN AND PELVIS WITH CONTRAST TECHNIQUE: Multidetector CT imaging of the chest was performed using the standard protocol during bolus administration of intravenous contrast. Multiplanar CT image reconstructions and MIPs were obtained to evaluate the vascular anatomy. Multidetector CT imaging of the abdomen and pelvis was performed using the standard  protocol during bolus administration of intravenous contrast. CONTRAST:  80 mL Isovue 370 COMPARISON:  None. FINDINGS: CTA CHEST FINDINGS Cardiovascular: Contrast injection is sufficient to demonstrate satisfactory opacification of the pulmonary arteries to the segmental level. There is no pulmonary embolus. The main pulmonary artery is within normal limits for size. There  is no CT evidence of acute right heart strain. Mild aortic atherosclerosis. There is coronary artery calcification. There is a normal 3-vessel arch branching pattern. Heart size is normal, without pericardial effusion. Mediastinum/Nodes: No mediastinal, hilar or axillary lymphadenopathy. The visualized thyroid and thoracic esophageal course are unremarkable. Lungs/Pleura: No pulmonary nodules or masses. No pleural effusion or pneumothorax. No focal airspace consolidation. No focal pleural abnormality. Musculoskeletal: No chest wall abnormality. No acute or significant osseous findings. Review of the MIP images confirms the above findings. CT ABDOMEN and PELVIS FINDINGS Hepatobiliary: There is hepatomegaly without focal liver lesion. Status post cholecystectomy. No biliary dilatation. Pancreas: Normal pancreatic contours and enhancement. No peripancreatic fluid collection or pancreatic ductal dilatation. Spleen: Normal. Adrenals/Urinary Tract: Normal adrenal glands. No hydronephrosis or solid renal mass. Stomach/Bowel: There is no hiatal hernia. The stomach and duodenum are normal. There is no dilated small bowel or enteric inflammation. There is no colonic abnormality. The appendix is normal. Vascular/Lymphatic: He There is atherosclerotic calcification of the non aneurysmal abdominal aorta. There is a retroaortic left renal vein. No abdominal or pelvic adenopathy. Reproductive: Uterus is normal.  No adnexal mass. Musculoskeletal: Severe right L5-S1 facet arthrosis. No bony spinal canal stenosis. Normal visualized extrathoracic and extraperitoneal  soft tissues. Other: Small, fat containing umbilical hernia. IMPRESSION: 1. No pulmonary embolus. 2. No acute abnormality of the chest, abdomen or pelvis. 3.  Aortic Atherosclerosis (ICD10-I70.0). Electronically Signed   By: Ulyses Jarred M.D.   On: 09/30/2016 22:17   Ct Abdomen Pelvis W Contrast  Result Date: 09/30/2016 CLINICAL DATA:  Chest pain, near syncope and tachypnea. EXAM: CT ANGIOGRAPHY CHEST CT ABDOMEN AND PELVIS WITH CONTRAST TECHNIQUE: Multidetector CT imaging of the chest was performed using the standard protocol during bolus administration of intravenous contrast. Multiplanar CT image reconstructions and MIPs were obtained to evaluate the vascular anatomy. Multidetector CT imaging of the abdomen and pelvis was performed using the standard protocol during bolus administration of intravenous contrast. CONTRAST:  80 mL Isovue 370 COMPARISON:  None. FINDINGS: CTA CHEST FINDINGS Cardiovascular: Contrast injection is sufficient to demonstrate satisfactory opacification of the pulmonary arteries to the segmental level. There is no pulmonary embolus. The main pulmonary artery is within normal limits for size. There is no CT evidence of acute right heart strain. Mild aortic atherosclerosis. There is coronary artery calcification. There is a normal 3-vessel arch branching pattern. Heart size is normal, without pericardial effusion. Mediastinum/Nodes: No mediastinal, hilar or axillary lymphadenopathy. The visualized thyroid and thoracic esophageal course are unremarkable. Lungs/Pleura: No pulmonary nodules or masses. No pleural effusion or pneumothorax. No focal airspace consolidation. No focal pleural abnormality. Musculoskeletal: No chest wall abnormality. No acute or significant osseous findings. Review of the MIP images confirms the above findings. CT ABDOMEN and PELVIS FINDINGS Hepatobiliary: There is hepatomegaly without focal liver lesion. Status post cholecystectomy. No biliary dilatation. Pancreas:  Normal pancreatic contours and enhancement. No peripancreatic fluid collection or pancreatic ductal dilatation. Spleen: Normal. Adrenals/Urinary Tract: Normal adrenal glands. No hydronephrosis or solid renal mass. Stomach/Bowel: There is no hiatal hernia. The stomach and duodenum are normal. There is no dilated small bowel or enteric inflammation. There is no colonic abnormality. The appendix is normal. Vascular/Lymphatic: He There is atherosclerotic calcification of the non aneurysmal abdominal aorta. There is a retroaortic left renal vein. No abdominal or pelvic adenopathy. Reproductive: Uterus is normal.  No adnexal mass. Musculoskeletal: Severe right L5-S1 facet arthrosis. No bony spinal canal stenosis. Normal visualized extrathoracic and extraperitoneal soft tissues. Other: Small, fat containing  umbilical hernia. IMPRESSION: 1. No pulmonary embolus. 2. No acute abnormality of the chest, abdomen or pelvis. 3.  Aortic Atherosclerosis (ICD10-I70.0). Electronically Signed   By: Ulyses Jarred M.D.   On: 09/30/2016 22:17    Procedures Procedures (including critical care time)  Medications Ordered in ED Medications  dextrose 5 %-0.45 % sodium chloride infusion (not administered)  insulin regular (NOVOLIN R,HUMULIN R) 100 Units in sodium chloride 0.9 % 100 mL (1 Units/mL) infusion (not administered)  aspirin chewable tablet 324 mg (324 mg Oral Given 09/30/16 1934)  LORazepam (ATIVAN) injection 0.5 mg (0.5 mg Intravenous Given 09/30/16 1935)  HYDROmorphone (DILAUDID) injection 0.5 mg (0.5 mg Intravenous Given 09/30/16 1934)  iopamidol (ISOVUE-370) 76 % injection (80 mLs  Contrast Given 09/30/16 2128)  lactated ringers bolus 2,000 mL (0 mLs Intravenous Stopped 09/30/16 2232)     Initial Impression / Assessment and Plan / ED Course  I have reviewed the triage vital signs and the nursing notes.  Pertinent labs & imaging results that were available during my care of the patient were reviewed by me and  considered in my medical decision making (see chart for details).     61 yo F With a chief complaint of chest pain. Patient's history is somewhat concerning for a PE with exertional symptoms as well as a feeling that she would pass out. She now having some abdominal tenderness. Obtain a CT scan to evaluate for PE as well as follow-through for the abdomen and pelvis. Patient was noted to have significant hyperglycemia. Give fluids.  CT scan negative.  Patient with continued symptoms.  Marked hyperglycemia with normal anion gap acidosis.  With continued symptoms will start on drip and discuss with hospitalist.   The patients results and plan were reviewed and discussed.   Any x-rays performed were independently reviewed by myself.   Differential diagnosis were considered with the presenting HPI.  Medications  dextrose 5 %-0.45 % sodium chloride infusion (not administered)  insulin regular (NOVOLIN R,HUMULIN R) 100 Units in sodium chloride 0.9 % 100 mL (1 Units/mL) infusion (not administered)  aspirin chewable tablet 324 mg (324 mg Oral Given 09/30/16 1934)  LORazepam (ATIVAN) injection 0.5 mg (0.5 mg Intravenous Given 09/30/16 1935)  HYDROmorphone (DILAUDID) injection 0.5 mg (0.5 mg Intravenous Given 09/30/16 1934)  iopamidol (ISOVUE-370) 76 % injection (80 mLs  Contrast Given 09/30/16 2128)  lactated ringers bolus 2,000 mL (0 mLs Intravenous Stopped 09/30/16 2232)    Vitals:   09/30/16 2030 09/30/16 2115 09/30/16 2215 09/30/16 2230  BP: (!) 166/58 116/65 (!) 150/69 (!) 165/78  Pulse: 67 71 74 76  Resp: (!) 22 (!) 27 19 14   Temp:      SpO2: 96% 98% 98% 100%    Final diagnoses:  Diabetes mellitus with hyperosmolarity without hyperglycemic hyperosmolar nonketotic coma ([place])     Final Clinical Impressions(s) / ED Diagnoses   Final diagnoses:  Diabetes mellitus with hyperosmolarity without hyperglycemic hyperosmolar nonketotic coma ([place])    New Prescriptions New Prescriptions   No  medications on file     Deno Etienne, DO 10/01/16 0015

## 2016-10-01 DIAGNOSIS — K219 Gastro-esophageal reflux disease without esophagitis: Secondary | ICD-10-CM | POA: Diagnosis not present

## 2016-10-01 DIAGNOSIS — N179 Acute kidney failure, unspecified: Secondary | ICD-10-CM

## 2016-10-01 DIAGNOSIS — Z79899 Other long term (current) drug therapy: Secondary | ICD-10-CM

## 2016-10-01 DIAGNOSIS — I700 Atherosclerosis of aorta: Secondary | ICD-10-CM | POA: Diagnosis not present

## 2016-10-01 DIAGNOSIS — R0609 Other forms of dyspnea: Secondary | ICD-10-CM

## 2016-10-01 DIAGNOSIS — R55 Syncope and collapse: Secondary | ICD-10-CM

## 2016-10-01 DIAGNOSIS — R1013 Epigastric pain: Secondary | ICD-10-CM | POA: Diagnosis not present

## 2016-10-01 DIAGNOSIS — Z794 Long term (current) use of insulin: Secondary | ICD-10-CM

## 2016-10-01 DIAGNOSIS — I10 Essential (primary) hypertension: Secondary | ICD-10-CM

## 2016-10-01 DIAGNOSIS — E785 Hyperlipidemia, unspecified: Secondary | ICD-10-CM

## 2016-10-01 DIAGNOSIS — E7800 Pure hypercholesterolemia, unspecified: Secondary | ICD-10-CM | POA: Diagnosis not present

## 2016-10-01 DIAGNOSIS — Z91018 Allergy to other foods: Secondary | ICD-10-CM

## 2016-10-01 DIAGNOSIS — R0789 Other chest pain: Secondary | ICD-10-CM | POA: Diagnosis not present

## 2016-10-01 DIAGNOSIS — Z8673 Personal history of transient ischemic attack (TIA), and cerebral infarction without residual deficits: Secondary | ICD-10-CM | POA: Diagnosis not present

## 2016-10-01 DIAGNOSIS — Z823 Family history of stroke: Secondary | ICD-10-CM

## 2016-10-01 DIAGNOSIS — I110 Hypertensive heart disease with heart failure: Secondary | ICD-10-CM | POA: Diagnosis not present

## 2016-10-01 DIAGNOSIS — I5181 Takotsubo syndrome: Secondary | ICD-10-CM | POA: Diagnosis not present

## 2016-10-01 DIAGNOSIS — Z833 Family history of diabetes mellitus: Secondary | ICD-10-CM

## 2016-10-01 DIAGNOSIS — E1165 Type 2 diabetes mellitus with hyperglycemia: Secondary | ICD-10-CM | POA: Diagnosis not present

## 2016-10-01 DIAGNOSIS — E1140 Type 2 diabetes mellitus with diabetic neuropathy, unspecified: Secondary | ICD-10-CM | POA: Diagnosis not present

## 2016-10-01 DIAGNOSIS — Z9049 Acquired absence of other specified parts of digestive tract: Secondary | ICD-10-CM | POA: Diagnosis not present

## 2016-10-01 DIAGNOSIS — E871 Hypo-osmolality and hyponatremia: Secondary | ICD-10-CM | POA: Diagnosis not present

## 2016-10-01 DIAGNOSIS — F419 Anxiety disorder, unspecified: Secondary | ICD-10-CM | POA: Diagnosis not present

## 2016-10-01 DIAGNOSIS — I509 Heart failure, unspecified: Secondary | ICD-10-CM | POA: Diagnosis not present

## 2016-10-01 DIAGNOSIS — Z885 Allergy status to narcotic agent status: Secondary | ICD-10-CM

## 2016-10-01 DIAGNOSIS — Z91040 Latex allergy status: Secondary | ICD-10-CM

## 2016-10-01 DIAGNOSIS — I252 Old myocardial infarction: Secondary | ICD-10-CM | POA: Diagnosis not present

## 2016-10-01 LAB — BASIC METABOLIC PANEL
ANION GAP: 6 (ref 5–15)
BUN: 21 mg/dL — ABNORMAL HIGH (ref 6–20)
CALCIUM: 8.7 mg/dL — AB (ref 8.9–10.3)
CO2: 25 mmol/L (ref 22–32)
Chloride: 104 mmol/L (ref 101–111)
Creatinine, Ser: 0.84 mg/dL (ref 0.44–1.00)
GLUCOSE: 235 mg/dL — AB (ref 65–99)
Potassium: 3.5 mmol/L (ref 3.5–5.1)
Sodium: 135 mmol/L (ref 135–145)

## 2016-10-01 LAB — TROPONIN I: Troponin I: <0.03 ng/mL (ref <0.03)

## 2016-10-01 LAB — GLUCOSE, CAPILLARY
Glucose-Capillary: 249 mg/dL — ABNORMAL HIGH (ref 65–99)
Glucose-Capillary: 258 mg/dL — ABNORMAL HIGH (ref 65–99)

## 2016-10-01 LAB — CBG MONITORING, ED: Glucose-Capillary: 341 mg/dL — ABNORMAL HIGH (ref 65–99)

## 2016-10-01 MED ORDER — SODIUM CHLORIDE 0.9 % IV SOLN
INTRAVENOUS | Status: AC
  Administered 2016-10-01: 02:00:00 via INTRAVENOUS

## 2016-10-01 MED ORDER — PANTOPRAZOLE SODIUM 40 MG PO TBEC: 40.0000 mg | DELAYED_RELEASE_TABLET | Freq: Two times a day (BID) | ORAL | 1 refills | Status: DC

## 2016-10-01 MED ORDER — MORPHINE SULFATE (PF) 4 MG/ML IV SOLN: 2.0000 mg | INTRAVENOUS | Status: DC | PRN

## 2016-10-01 MED ORDER — ASPIRIN EC 81 MG PO TBEC
81.0000 mg | DELAYED_RELEASE_TABLET | Freq: Every day | ORAL | Status: DC
  Administered 2016-10-01: 81 mg via ORAL
  Filled 2016-10-01: qty 1

## 2016-10-01 MED ORDER — ENOXAPARIN SODIUM 40 MG/0.4ML ~~LOC~~ SOLN
40.0000 mg | SUBCUTANEOUS | Status: DC
  Filled 2016-10-01: qty 0.4

## 2016-10-01 MED ORDER — INSULIN ASPART 100 UNIT/ML ~~LOC~~ SOLN
6.0000 [IU] | Freq: Three times a day (TID) | SUBCUTANEOUS | Status: DC
  Administered 2016-10-01 (×2): 6 [IU] via SUBCUTANEOUS

## 2016-10-01 MED ORDER — GI COCKTAIL ~~LOC~~: 30.0000 mL | Freq: Four times a day (QID) | ORAL | Status: DC | PRN

## 2016-10-01 MED ORDER — GABAPENTIN 300 MG PO CAPS: 300.0000 mg | ORAL_CAPSULE | Freq: Two times a day (BID) | ORAL | Status: DC

## 2016-10-01 MED ORDER — GI COCKTAIL ~~LOC~~
30.0000 mL | Freq: Three times a day (TID) | ORAL | Status: DC
  Administered 2016-10-01: 30 mL via ORAL
  Filled 2016-10-01: qty 30

## 2016-10-01 MED ORDER — INSULIN ASPART 100 UNIT/ML ~~LOC~~ SOLN: 10.0000 [IU] | Freq: Three times a day (TID) | SUBCUTANEOUS | Status: DC

## 2016-10-01 MED ORDER — ENSURE ENLIVE PO LIQD
237.0000 mL | Freq: Two times a day (BID) | ORAL | Status: DC
  Administered 2016-10-01: 237 mL via ORAL

## 2016-10-01 MED ORDER — PRAVASTATIN SODIUM 40 MG PO TABS
40.0000 mg | ORAL_TABLET | Freq: Every day | ORAL | Status: DC
Start: 2016-10-01 — End: 2016-10-01

## 2016-10-01 MED ORDER — INSULIN ASPART 100 UNIT/ML ~~LOC~~ SOLN
0.0000 [IU] | Freq: Three times a day (TID) | SUBCUTANEOUS | Status: DC
  Administered 2016-10-01: 3 [IU] via SUBCUTANEOUS
  Administered 2016-10-01: 5 [IU] via SUBCUTANEOUS

## 2016-10-01 MED ORDER — ALUM & MAG HYDROXIDE-SIMETH 400-400-40 MG/5ML PO SUSP: 10.0000 mL | Freq: Four times a day (QID) | ORAL | 0 refills | Status: DC | PRN

## 2016-10-01 MED ORDER — ACETAMINOPHEN 325 MG PO TABS: 650.0000 mg | ORAL_TABLET | ORAL | Status: DC | PRN

## 2016-10-01 MED ORDER — ONDANSETRON HCL 4 MG/2ML IJ SOLN: 4.0000 mg | Freq: Four times a day (QID) | INTRAMUSCULAR | Status: DC | PRN

## 2016-10-01 MED ORDER — CARVEDILOL 12.5 MG PO TABS
12.5000 mg | ORAL_TABLET | Freq: Two times a day (BID) | ORAL | Status: DC
  Administered 2016-10-01: 12.5 mg via ORAL
  Filled 2016-10-01: qty 1

## 2016-10-01 MED ORDER — INSULIN DETEMIR 100 UNIT/ML ~~LOC~~ SOLN
35.0000 [IU] | Freq: Every day | SUBCUTANEOUS | Status: DC
  Administered 2016-10-01: 35 [IU] via SUBCUTANEOUS
  Filled 2016-10-01 (×2): qty 0.35

## 2016-10-01 MED ORDER — PANTOPRAZOLE SODIUM 40 MG PO TBEC
80.0000 mg | DELAYED_RELEASE_TABLET | Freq: Every day | ORAL | Status: DC
  Administered 2016-10-01: 80 mg via ORAL
  Filled 2016-10-01: qty 2

## 2016-10-01 NOTE — H&P (Signed)
Date: 10/01/2016               Patient Name:  Kara Hanson MRN: [id number]  DOB: Jun 14, 1955 Age / Sex: 61 y.o., female   PCP: Arnoldo Morale, MD         Medical Service: Internal Medicine Teaching Service         Attending Physician: Dr. Bartholomew Crews, MD    First Contact: Dr. Danford Bad Pager: [phone]  Second Contact: Dr. Benjamine Mola Pager: [phone]       After Hours (After 5p/  First Contact Pager: [phone]  weekends / holidays): Second Contact Pager: [phone]   Chief Complaint: Chest Pain and Shortness of Breath  History of Present Illness:  Ms. Kara Hanson is a 61 year old female with a past medical history significant for type 2 diabetes (uncontrolled, last A1C 12.2), stress induced cardiomyopathy, hypternsion, hyperlipidemia, C-spine stannosis presenting to the emergency department this evening with complaints of chest pain and shortness of breath. She reports that yesterday afternoon she was walking to the refrigerator and became lightheaded/dizzy and extremely short of breath and felt like she was going to pass out. Also noted accompanied substernal chest pressure at that time. She denies any falls or loss of consciousness. Reports the episode lasted 2-3 minutes and resolved with rest. Symptoms returned with any exertion and improved with rest. She continued to have symptoms today and her shortness of breath acutely worsened this afternoon while sitting on the couch (appears to first time she had symptoms at rest) and she called EMS. She denies any diaphoresis or vision changes but does report nausea without emesis prior to coming to the ED as well as left sided jaw pain. Does endorse headaches. No radiation to her left arm. No palpitations, fevers, chills, cough, LE edema. She denies any symptoms prior to yesterday afternoon.   She has been admitted on 04/22/2016 and again on 06/17/2016 with similar complaints and chest pain. During her 2022/05/26 admission she was noted to have T-wave  inversions inferior and laterally that were new but negative serial troponins. A 2D ECHO was performed which demonstrated an EF fo 35%. She subsequently underwent left heart cath which showed normal coronary arteries and normal LV function with EF of 50%. Presumed to be stress induced cardiomyopathy and patient was continued on medical therapy with beta blocker and ARB. Repeat 2D ECHO during most recent admission in March demonstrated normal EF at 55-60%. March admission for chest pain had EKG with no ischemic changes and negative serial troponins. It appears that her husband passed away unexpectedly in 05-26-22 and felt that this may be playing a role in her symptoms.   In the ED, she was noted to be tachypenic with RR in the upper 20s; no tachycardia, hypertensive in the [id number] systolic, saturating well on room air and afebrile. EDP was initially concerned for possible PE and CTA was performed with abdomen/pelvis follow through due to epigastric pain complaints on arrival. CT showed no evidence of PE with no acute abnormalities of her chest, abdomen or pelvis. Did demonstrate aortic atherosclerosis. CXR obtained showed no acute cardiopulmonary process. EKG was obtained which show no ischemic changes and unchanged from her 06/2016 tracing. I-stat troponin was negative x2. CBC was unremarkable with no leukocytosis, anemia or other abnormality. CMET was remarkable for Na of 128, Cl 97, CO2 18, Glucose 515, BUN 32, Cr 1.29.   On exam, she has no further complaints of chest pain. Currently only complaining of shortness of  breath.   Meds:  Current Meds  Medication Sig  . aspirin EC 81 MG tablet Take 81 mg by mouth daily.  . carvedilol (COREG) 12.5 MG tablet Take 1 tablet (12.5 mg total) by mouth 2 (two) times daily with a meal.  . furosemide (LASIX) 40 MG tablet Take 1 tablet (40 mg total) by mouth daily.  Marland Kitchen gabapentin (NEURONTIN) 300 MG capsule Take 1 capsule (300 mg total) by mouth 2 (two) times daily.  Marland Kitchen  ibuprofen (ADVIL,MOTRIN) 200 MG tablet Take 200-600 mg by mouth every 6 (six) hours as needed for headache, mild pain or moderate pain.   Marland Kitchen insulin aspart (NOVOLOG) 100 UNIT/ML injection 0-12 units according to sliding scale (Patient taking differently: Inject 10 Units into the skin 3 (three) times daily before meals. )  . insulin glargine (LANTUS) 100 UNIT/ML injection Inject 0.35 mLs (35 Units total) into the skin at bedtime.  Marland Kitchen losartan (COZAAR) 50 MG tablet Take 1 tablet (50 mg total) by mouth daily.  . meclizine (ANTIVERT) 25 MG tablet Take 1 tablet (25 mg total) by mouth 2 (two) times daily as needed. (Patient taking differently: Take 25 mg by mouth 2 (two) times daily as needed (vertigo). )  . Multiple Vitamin (MULTIVITAMIN WITH MINERALS) TABS tablet Take 1 tablet by mouth daily. Women's One a Day  . pravastatin (PRAVACHOL) 40 MG tablet Take 1 tablet (40 mg total) by mouth every evening. (Patient taking differently: Take 40 mg by mouth at bedtime. )     Allergies: Allergies as of 09/30/2016 - Review Complete 09/30/2016  Allergen Reaction Noted  . Garlic Shortness Of Breath, Itching, and Swelling 06/17/2016  . Latex Itching 10/01/2015  . Morphine and related Itching and Other (See Comments) 10/01/2015  . Other Itching 09/30/2016   Past Medical History:  Diagnosis Date  . CHF (congestive heart failure) ([place])   . CVA (cerebral vascular accident) ([place])   . Diabetes mellitus without complication ([place])   . Hypercholesteremia   . Hypertension   . Myocardial infarction ([place])   . Spinal stenosis    Past Surgical History:  Procedure Laterality Date  . BLADDER SURGERY    . CARDIAC CATHETERIZATION N/A 04/25/2016   Procedure: Left Heart Cath and Coronary Angiography;  Surgeon: Lorretta Harp, MD;  Location: [facility];  Service: Cardiovascular;  Laterality: N/A;  . CESAREAN SECTION    . CHOLECYSTECTOMY      Family History:  Family History  Problem Relation Age of Onset  .  Diabetes Mellitus II Father   . Stroke Father    Social History:  Social History  Substance Use Topics  . Smoking status: Never Smoker  . Smokeless tobacco: Never Used  . Alcohol use No    Review of Systems: A complete ROS was negative except as per HPI.   Physical Exam: Blood pressure (!) 175/86, pulse 60, temperature 98.2 F (36.8 C), resp. rate 17, SpO2 96 %.  Physical Exam  Constitutional: She is oriented to person, place, and time and well-developed, well-nourished, and in no distress. No distress.  HENT:  Head: Normocephalic and atraumatic.  Eyes: EOM are normal. Pupils are equal, round, and reactive to light.  Neck: No JVD present.  Cardiovascular: Normal rate, regular rhythm and normal heart sounds.  Exam reveals no gallop and no friction rub.   No murmur heard. Pulmonary/Chest: Effort normal and breath sounds normal. No respiratory distress. She has no wheezes. She has no rales. She exhibits no tenderness.  Abdominal:  Soft. Bowel sounds are normal. She exhibits no distension. There is no tenderness. There is no rebound and no guarding.  Musculoskeletal: She exhibits no edema.  Neurological: She is alert and oriented to person, place, and time.  Skin: Skin is warm and dry. No erythema.  Psychiatric: Mood and affect normal.  Vitals reviewed.   EKG: personally reviewed my interpretation is normal sinus rhythm, no ischemic changes. Unchanged from prior tracing.   CXR: personally reviewed my interpretation is no acute cardiopulmonary abnormality  Assessment & Plan by Problem:  Chest pain: Ms. Chaidez presents with acute onset of substernal chest pressure and shortness of breath with exertion. This is her third admission this year with similar complaints. Prior work-up including LHC in January has been unremarkable with normal coronary arteries and no evidence of active cardiac disease. She does have evidence of aortic atherosclerosis on imaging. Work up in the ED was  unremarkable with no evidence of PE or other abnormalities on CTA/CT abdomen/pelvis. CXR was clear. EKG with no ischemic changes and I-stat troponin in the ED are negative. The time course of her symptoms does appear to start around the time of her husband's expected passing and anxiety/panic attacks may be a component of her symptoms. She did have relief in her symptoms after receiving a dose of Ativan and Dilaudid in the ED. Other considerations include GERD, esophageal spasms or prinzmetal angina (though her symptoms appear to occur with exertion and during the day as opposed to at rest and overnight as one would expect from vasogenic angina). Further history of any GI symptoms of reflux or dysmotility will be investigated in the in morning. She denies any depression or anxiety currently.  -Trend troponins  -EKG in am -ASA 81 mg daily -GI cocktail prn -Morphine 2 mg q2hr prn chest pain  Type 2 diabetes mellitus/Hyperglycemia without DKA or HHS: Patient with uncontrolled DM type 2. Last A1c on 05/06/16 was 12.2. She is currently taking Lantus 35 units qhs and Novolog 10 units tid qac. She reports her blood sugars consisently run 200-300 at home. Today, her initial CBGs were in the 500s. Anion gap of 13 on BMET. She is mentation well. No UA was collected. She received 2L of Lactated Ringers in the ED. CBG subsequently improved to 341 without any insulin given. She has had elevated CBGs on previous admissions and dramatic improvement to the 100s upon administration of her home regimen insulin. Question home compliance.  -Levemir 35 units qhs -Novolog 6 units tid qac -SSI-s -CBG qac, qhs -NS 125 mL/hr overnight -Re-check BMET in am  Acute Kidney Injury:  Cr 1.28 on admission. Previously Cr has been 0.7-1. Giving IVF. Holding home ARB.  -BMET in am  Pseudohyponatremia: Na 128 on admission. However, corrects to 135 when accounting for her hyperglycemia of 515.  -BMET in am  Hypertension: Patient  is on Losartan 50 mg daily and Carvedilol 12.5 mg bid (reports taking daily) at home. She is hypertensive her with SBP in the 150-160s. Will hold her home ARB due to mild AKI. Likely resumption tomorrow if her AKI resolves with IVF. Continue home BB.   Stress-induced cardiomyopathy: Most recent ECHO 06/2016 with normal EF 50-55%. Notable for grade 2 diastolic dysfunction. Does not appear volume overloaded on exam. Will hold home dose lasix and ARB with mild AKI. Continue home BB.  HLD (hyperlipidemia): Continue home pravastatin.    Dispo: Admit patient to Observation with expected length of stay less than 2 midnights.  Signed: Charlynn Grimes,  Ovid Curd, MD 10/01/2016, 12:35 AM  Pager: [phone]

## 2016-10-01 NOTE — Progress Notes (Signed)
Discharged to home with family office visits in place teaching done  

## 2016-10-01 NOTE — Progress Notes (Signed)
Nutrition Brief Note  Patient identified on the Malnutrition Screening Tool (MST) Report  Wt Readings from Last 15 Encounters:  10/01/16 175 lb 9.6 oz (79.7 kg)  06/18/16 174 lb 8 oz (79.2 kg)  05/19/16 167 lb 3.2 oz (75.8 kg)  05/06/16 165 lb (74.8 kg)  04/25/16 164 lb 11.2 oz (74.7 kg)  02/04/16 167 lb 8 oz (76 kg)  12/05/15 152 lb (68.9 kg)  11/12/15 159 lb (72.1 kg)   Body mass index is 34.29 kg/m. Patient meets criteria for Obese based on current BMI.  Patient had reported on MST screen that she has been eating poorly due to poor appetite and had lost weight.   Today, the patient reports that she weighed 220 lbs a couple months ago. She says she only eats one bowl of food a day due to severe dysgeusia. She does not know why nothing tastes right.  She avoids all meat  RD recommend supplementation given her poor intake and avoidance of meats. Discussed how sweet foods typically maintain their normal taste. Suggested marinating meats to help improve taste. She says sweet foods also taste poor. She mostly eats fruit/veg. She takes one a day  Despite her report, per review of chart, she has gained weight over the past year and there is no history to show she has ever weighed over 180, let alone 220.   Current diet order is Carb mod, no documented intake of meals at this time.   Will order Ensure and add fruit to tray. . If nutrition issues arise, please consult RD.   Burtis Junes RD, LDN, CNSC Clinical Nutrition Pager: [phone] 10/01/2016 10:27 AM

## 2016-10-01 NOTE — Discharge Instructions (Signed)
Kara Hanson, you were admitted for evaluation of your long-standing chest discomfort. You've had a thorough cardiac and pulmonary work-up over the past few months which has returned as normal. No life-threatening cause has been found for your symptoms and this can be worked up with your primary care physician. Many different things cause chest pain and shortness of breath including gastrointestinal issues such as acid reflux or deconditioning. You had difficulty sitting up in bed which indicates a degree of deconditioning. Unfortunately, when we have acid reflux, this can irritate certain nerves which feels like chest pain. I have prescribed you a medication called Pantoprazole which you should take every single day for stomach acid. Please buy Maalox Max as well and take this as needed for indigestion or chest pain.   Heartburn Heartburn is a type of pain or discomfort that can happen in the throat or chest. It is often described as a burning pain. It may also cause a bad taste in the mouth. Heartburn may feel worse when you lie down or bend over, and it is often worse at night. Heartburn may be caused by stomach contents that move back up into the esophagus (reflux). Follow these instructions at home: Take these actions to decrease your discomfort and to help avoid complications. Diet  Follow a diet as recommended by your health care provider. This may involve avoiding foods and drinks such as: ? Coffee and tea (with or without caffeine). ? Drinks that contain alcohol. ? Energy drinks and sports drinks. ? Carbonated drinks or sodas. ? Chocolate and cocoa. ? Peppermint and mint flavorings. ? Garlic and onions. ? Horseradish. ? Spicy and acidic foods, including peppers, chili powder, curry powder, vinegar, hot sauces, and barbecue sauce. ? Citrus fruit juices and citrus fruits, such as oranges, lemons, and limes. ? Tomato-based foods, such as red sauce, chili, salsa, and pizza with red  sauce. ? Fried and fatty foods, such as donuts, french fries, potato chips, and high-fat dressings. ? High-fat meats, such as hot dogs and fatty cuts of red and white meats, such as rib eye steak, sausage, ham, and bacon. ? High-fat dairy items, such as whole milk, butter, and cream cheese.  Eat small, frequent meals instead of large meals.  Avoid drinking large amounts of liquid with your meals.  Avoid eating meals during the 2-3 hours before bedtime.  Avoid lying down right after you eat.  Do not exercise right after you eat. General instructions  Pay attention to any changes in your symptoms.  Take over-the-counter and prescription medicines only as told by your health care provider. Do not take aspirin, ibuprofen, or other NSAIDs unless your health care provider told you to do so.  Do not use any tobacco products, including cigarettes, chewing tobacco, and e-cigarettes. If you need help quitting, ask your health care provider.  Wear loose-fitting clothing. Do not wear anything tight around your waist that causes pressure on your abdomen.  Raise (elevate) the head of your bed about 6 inches (15 cm).  Try to reduce your stress, such as with yoga or meditation. If you need help reducing stress, ask your health care provider.  If you are overweight, reduce your weight to an amount that is healthy for you. Ask your health care provider for guidance about a safe weight loss goal.  Keep all follow-up visits as told by your health care provider. This is important. Contact a health care provider if:  You have new symptoms.  You have unexplained weight  loss.  You have difficulty swallowing, or it hurts to swallow.  You have wheezing or a persistent cough.  Your symptoms do not improve with treatment.  You have frequent heartburn for more than two weeks. Get help right away if:  You have pain in your arms, neck, jaw, teeth, or back.  You feel sweaty, dizzy, or  light-headed.  You have chest pain or shortness of breath.  You vomit and your vomit looks like blood or coffee grounds.  Your stool is bloody or black. This information is not intended to replace advice given to you by your health care provider. Make sure you discuss any questions you have with your health care provider. Document Released: 08/07/2008 Document Revised: 08/27/2015 Document Reviewed: 07/16/2014 Elsevier Interactive Patient Education  2017 [employer].

## 2016-10-01 NOTE — Progress Notes (Signed)
   Subjective: Seen and evaluated today at bedside. No more CP since admission. Provides additional history of her food not tasting right for awhile. Denies any heartburn. Perplexed by chronicity of SOB.  Objective:  Vital signs in last 24 hours: Vitals:   10/01/16 0155 10/01/16 0158 10/01/16 0203 10/01/16 0435  BP: (!) 176/72 (!) 177/77 (!) 156/69 (!) 144/63  Pulse: 86   66  Resp: (!) 24 (!) 22 (!) 22 18  Temp: 97.8 F (36.6 C)   97.9 F (36.6 C)  TempSrc: Oral   Oral  SpO2: 99% 95% 99% 97%  Weight: 175 lb 9.6 oz (79.7 kg)     Height: 5' (1.524 m)      General: NAD. Resting comfortably in bed while laying nearly flat. Anxious Cardiac: RRR without murmur, gallop or rub. Intact distal pulses Pulmonary: CTA BL with good air movement. No dyspnea noted. Winded with exertion Extremities: No peripheral edema. No cyanosis.   Assessment/Plan:  Principal Problem:   Chest pain Active Problems:   Hypertension   Type 2 diabetes mellitus with diabetic neuropathy, unspecified (HCC)   Stress-induced cardiomyopathy   HLD (hyperlipidemia)  Atypical Chest Pain, likely secondary to GERD vs Anxiety She's been hospitalized twice in the past several months with this complaint and had an extensive cardiac work-up. She had a cardiac cath which showed clean coronary arteries and an ejection fraction of 50%. ECHO was without valvular disease. CTA was without PE, ILD, infiltrate or edema. Telemetry was reviewed which was without issue. Strongly believe patients symptoms are due to either GERD, anxiety or a combination of both. She lost her husband in Jan, which is the time around symptom on set. She has epigastric and LUQ tenderness to palpation and in addition describes that food doesn't taste right. She denies heartburn. We will treat for GERD and dc home for patient to FU with her PCP about anxiety.  -GI cocktail now -Start PPI -Pt to follow-up with PCP outpatient   AKI: Resolved with IVF. Will  resume ACE-I at d/c.   Dispo: Anticipated discharge today.   Kara Reznik, DO 10/01/2016, 1:26 PM Pager: [phone]

## 2016-10-02 LAB — HEMOGLOBIN A1C
Hgb A1c MFr Bld: 13.8 % — ABNORMAL HIGH (ref 4.8–5.6)
Mean Plasma Glucose: 349 mg/dL

## 2016-10-03 NOTE — Discharge Summary (Signed)
Name: Kara Hanson MRN: [id number] DOB: 02-Nov-1955 61 y.o. PCP: Arnoldo Morale, MD  Date of Admission: 09/30/2016  6:33 PM Date of Discharge: 10/01/2016 Attending Physician: Dr. Larey Dresser, MD  Discharge Diagnosis: 1. Atypical Chest Pain 2. Aortic Atherosclerosis  Principal Problem:   Chest pain Active Problems:   Hypertension   Type 2 diabetes mellitus with diabetic neuropathy, unspecified (HCC)   Stress-induced cardiomyopathy   HLD (hyperlipidemia)   Discharge Medications: Allergies as of 10/01/2016      Reactions   Garlic Shortness Of Breath, Itching, Swelling   Hand itching and swelling   Latex Itching   Morphine And Related Itching, Other (See Comments)   Headache   Other Itching   Reaction to newspaper ink -itching and headache      Medication List    STOP taking these medications   HYDROcodone-acetaminophen 5-325 MG tablet Commonly known as:  NORCO/VICODIN   traMADol 50 MG tablet Commonly known as:  ULTRAM     TAKE these medications   accu-chek softclix lancets Use as instructed three times daily   alum & mag hydroxide-simeth 400-400-40 MG/5ML suspension Commonly known as:  MAALOX MAX Take 10 mLs by mouth every 6 (six) hours as needed for indigestion (chest pain).   aspirin EC 81 MG tablet Take 81 mg by mouth daily.   carvedilol 12.5 MG tablet Commonly known as:  COREG Take 1 tablet (12.5 mg total) by mouth 2 (two) times daily with a meal.   furosemide 40 MG tablet Commonly known as:  LASIX Take 1 tablet (40 mg total) by mouth daily.   gabapentin 300 MG capsule Commonly known as:  NEURONTIN Take 1 capsule (300 mg total) by mouth 2 (two) times daily.   glucose blood test strip Commonly known as:  ACCU-CHEK AVIVA Use as instructed 3 times daily   ibuprofen 200 MG tablet Commonly known as:  ADVIL,MOTRIN Take 200-600 mg by mouth every 6 (six) hours as needed for headache, mild pain or moderate pain.   insulin aspart 100 UNIT/ML  injection Commonly known as:  novoLOG Inject 10 Units into the skin 3 (three) times daily before meals.   insulin glargine 100 UNIT/ML injection Commonly known as:  LANTUS Inject 0.35 mLs (35 Units total) into the skin at bedtime.   losartan 50 MG tablet Commonly known as:  COZAAR Take 1 tablet (50 mg total) by mouth daily.   meclizine 25 MG tablet Commonly known as:  ANTIVERT Take 1 tablet (25 mg total) by mouth 2 (two) times daily as needed. What changed:  reasons to take this   multivitamin with minerals Tabs tablet Take 1 tablet by mouth daily. Women's One a Day   pantoprazole 40 MG tablet Commonly known as:  PROTONIX Take 1 tablet (40 mg total) by mouth 2 (two) times daily.   pravastatin 40 MG tablet Commonly known as:  PRAVACHOL Take 1 tablet (40 mg total) by mouth every evening. What changed:  when to take this       Disposition and follow-up:   Kara Hanson was discharged from [facility] in stable condition.  At the hospital follow up visit please address:  1.  Atypical Chest Pain: Ensure patients compliance with daily BID PPI therapy! She does express symptoms consistent with GERD however is still unsure of this as contributing. In addition, we wonder if her underlying depression or anxiety is playing a role as well. She may benefit from CBT vs SSRI therapy.   2.  Labs / imaging needed at time of follow-up: none  3.  Pending labs/ test needing follow-up: none  Hospital Course by problem list: Principal Problem:   Chest pain Active Problems:   Hypertension   Type 2 diabetes mellitus with diabetic neuropathy, unspecified (HCC)   Stress-induced cardiomyopathy   HLD (hyperlipidemia)   Atypical Chest Pain Stress-Induced Cardiomyopathy GERD This is a 61 y/o F admitted 09/30/16 with complaints of chest pain and SOB. She has been admitted 2 other times since Jan, 2018 with the same complaints. Reports DOE, substernal chest pressure and  nausea. She also reports her food has not tasted right during this time at home. She's had an extensive work-up including cardiac cath which was without atherosclerosis. She did have an ECHO during her prior admission which showed an EF of 35% and normal valves however had normal EF on cath. CTA performed this admission was negative for PE or ILD and there was no indication of right heart strain of pulmonary hypertension. Telemetry was without arrhythmia and EKG was unchanged. She reports she is active at home however patient deconditioned on examination as evidenced by difficulty sitting up in bed. Patients husband passed unexpectedly in January 2018, around time of symptom presentation. She denies any anxiety however suspect this could be playing a large role. It is also felt that underlying GERD could be playing a role as well given her change in taste and epigastric tenderness. She was discharged home with PPI therapy and instructions for close follow-up with her PCP to continue working this up. She is already maintained on ASA, statin, CCB, ARB.   Aortic Atherosclerosis Pt on ASA and statin therapy. These were continued on discharge.   Discharge Vitals:   BP (!) 160/59 (BP Location: Left Arm)   Pulse 72   Temp 98.2 F (36.8 C) (Oral)   Resp 18   Ht 5' (1.524 m)   Wt 175 lb 9.6 oz (79.7 kg)   SpO2 98%   BMI 34.29 kg/m   Pertinent Labs, Studies, and Procedures:  CTA: Negative for PE. No pleural effusion or pneumothorax. No focal airspace consolidation and no focal pleural abnormality.  CT abdomen/pelvis: No acute abnormality of abdomen or pelvis. Aortic atherosclerosis  Signed: MoltRomelle Starcher, DO 10/03/2016, 8:11 AM   Pager: [phone]

## 2016-10-06 ENCOUNTER — Telehealth: Payer: Self-pay | Admitting: Family Medicine

## 2016-10-06 DIAGNOSIS — Z794 Long term (current) use of insulin: Principal | ICD-10-CM

## 2016-10-06 DIAGNOSIS — E1169 Type 2 diabetes mellitus with other specified complication: Secondary | ICD-10-CM

## 2016-10-06 MED ORDER — INSULIN GLARGINE 100 UNIT/ML ~~LOC~~ SOLN: 35.0000 [IU] | Freq: Every day | SUBCUTANEOUS | 0 refills | Status: DC

## 2016-10-06 MED ORDER — INSULIN ASPART 100 UNIT/ML ~~LOC~~ SOLN: 10.0000 [IU] | Freq: Three times a day (TID) | SUBCUTANEOUS | 0 refills | Status: DC

## 2016-10-06 NOTE — Telephone Encounter (Signed)
Patient is requesting a refill for novolog and lantus, please advised

## 2016-10-06 NOTE — Telephone Encounter (Signed)
Refilled x 1 vial - patient needs office visit for further refills.

## 2016-10-17 ENCOUNTER — Ambulatory Visit: Payer: Medicare Other | Admitting: Family Medicine

## 2016-10-28 ENCOUNTER — Other Ambulatory Visit: Payer: Self-pay | Admitting: Family Medicine

## 2016-10-28 DIAGNOSIS — Z794 Long term (current) use of insulin: Principal | ICD-10-CM

## 2016-10-28 DIAGNOSIS — E1169 Type 2 diabetes mellitus with other specified complication: Secondary | ICD-10-CM

## 2016-10-28 MED ORDER — INSULIN LISPRO 100 UNIT/ML ~~LOC~~ SOLN: 10.0000 [IU] | Freq: Three times a day (TID) | SUBCUTANEOUS | 3 refills | Status: DC

## 2016-10-28 NOTE — Progress Notes (Signed)
huma

## 2016-11-02 ENCOUNTER — Telehealth: Payer: Self-pay | Admitting: Family Medicine

## 2016-11-02 DIAGNOSIS — Z794 Long term (current) use of insulin: Principal | ICD-10-CM

## 2016-11-02 DIAGNOSIS — E1140 Type 2 diabetes mellitus with diabetic neuropathy, unspecified: Secondary | ICD-10-CM

## 2016-11-02 MED ORDER — GABAPENTIN 300 MG PO CAPS: 300.0000 mg | ORAL_CAPSULE | Freq: Two times a day (BID) | ORAL | 2 refills | Status: DC

## 2016-11-02 NOTE — Telephone Encounter (Signed)
Pt daughter call to request the follow med for her mother . gabapentin (NEURONTIN) 300 MG capsule   Please follow up, Also she want to be call

## 2016-11-02 NOTE — Telephone Encounter (Signed)
Refilled

## 2016-11-07 ENCOUNTER — Other Ambulatory Visit: Payer: Self-pay | Admitting: Internal Medicine

## 2016-12-27 ENCOUNTER — Other Ambulatory Visit: Payer: Self-pay | Admitting: Family Medicine

## 2016-12-27 ENCOUNTER — Other Ambulatory Visit: Payer: Self-pay | Admitting: Internal Medicine

## 2016-12-27 DIAGNOSIS — E1140 Type 2 diabetes mellitus with diabetic neuropathy, unspecified: Secondary | ICD-10-CM

## 2016-12-27 DIAGNOSIS — I5022 Chronic systolic (congestive) heart failure: Secondary | ICD-10-CM

## 2016-12-27 DIAGNOSIS — Z794 Long term (current) use of insulin: Principal | ICD-10-CM

## 2017-01-18 DIAGNOSIS — I110 Hypertensive heart disease with heart failure: Secondary | ICD-10-CM | POA: Diagnosis not present

## 2017-01-18 DIAGNOSIS — E1140 Type 2 diabetes mellitus with diabetic neuropathy, unspecified: Secondary | ICD-10-CM | POA: Diagnosis not present

## 2017-01-18 DIAGNOSIS — E119 Type 2 diabetes mellitus without complications: Secondary | ICD-10-CM | POA: Diagnosis present

## 2017-01-18 DIAGNOSIS — S52561A Barton's fracture of right radius, initial encounter for closed fracture: Secondary | ICD-10-CM | POA: Diagnosis not present

## 2017-01-18 DIAGNOSIS — J189 Pneumonia, unspecified organism: Secondary | ICD-10-CM | POA: Diagnosis not present

## 2017-01-18 DIAGNOSIS — J811 Chronic pulmonary edema: Secondary | ICD-10-CM | POA: Diagnosis not present

## 2017-01-18 DIAGNOSIS — M79651 Pain in right thigh: Secondary | ICD-10-CM | POA: Diagnosis not present

## 2017-01-18 DIAGNOSIS — I5023 Acute on chronic systolic (congestive) heart failure: Secondary | ICD-10-CM | POA: Diagnosis present

## 2017-01-18 DIAGNOSIS — E785 Hyperlipidemia, unspecified: Secondary | ICD-10-CM | POA: Diagnosis present

## 2017-01-18 DIAGNOSIS — R278 Other lack of coordination: Secondary | ICD-10-CM | POA: Diagnosis not present

## 2017-01-18 DIAGNOSIS — I509 Heart failure, unspecified: Secondary | ICD-10-CM | POA: Diagnosis not present

## 2017-01-18 DIAGNOSIS — M47812 Spondylosis without myelopathy or radiculopathy, cervical region: Secondary | ICD-10-CM | POA: Diagnosis not present

## 2017-01-18 DIAGNOSIS — E871 Hypo-osmolality and hyponatremia: Secondary | ICD-10-CM | POA: Diagnosis present

## 2017-01-18 DIAGNOSIS — R918 Other nonspecific abnormal finding of lung field: Secondary | ICD-10-CM | POA: Diagnosis not present

## 2017-01-18 DIAGNOSIS — R2681 Unsteadiness on feet: Secondary | ICD-10-CM | POA: Diagnosis not present

## 2017-01-18 DIAGNOSIS — M25652 Stiffness of left hip, not elsewhere classified: Secondary | ICD-10-CM | POA: Diagnosis not present

## 2017-01-18 DIAGNOSIS — Z9181 History of falling: Secondary | ICD-10-CM | POA: Diagnosis not present

## 2017-01-18 DIAGNOSIS — N390 Urinary tract infection, site not specified: Secondary | ICD-10-CM | POA: Diagnosis not present

## 2017-01-18 DIAGNOSIS — J90 Pleural effusion, not elsewhere classified: Secondary | ICD-10-CM | POA: Diagnosis not present

## 2017-01-18 DIAGNOSIS — E8770 Fluid overload, unspecified: Secondary | ICD-10-CM | POA: Diagnosis not present

## 2017-01-18 DIAGNOSIS — S62306D Unspecified fracture of fifth metacarpal bone, right hand, subsequent encounter for fracture with routine healing: Secondary | ICD-10-CM | POA: Diagnosis not present

## 2017-01-18 DIAGNOSIS — Z91040 Latex allergy status: Secondary | ICD-10-CM | POA: Diagnosis not present

## 2017-01-18 DIAGNOSIS — S52611S Displaced fracture of right ulna styloid process, sequela: Secondary | ICD-10-CM | POA: Diagnosis not present

## 2017-01-18 DIAGNOSIS — J181 Lobar pneumonia, unspecified organism: Secondary | ICD-10-CM | POA: Diagnosis not present

## 2017-01-18 DIAGNOSIS — I10 Essential (primary) hypertension: Secondary | ICD-10-CM | POA: Diagnosis not present

## 2017-01-18 DIAGNOSIS — S62346A Nondisplaced fracture of base of fifth metacarpal bone, right hand, initial encounter for closed fracture: Secondary | ICD-10-CM | POA: Diagnosis not present

## 2017-01-18 DIAGNOSIS — I6782 Cerebral ischemia: Secondary | ICD-10-CM | POA: Diagnosis not present

## 2017-01-18 DIAGNOSIS — R6884 Jaw pain: Secondary | ICD-10-CM | POA: Diagnosis not present

## 2017-01-18 DIAGNOSIS — I160 Hypertensive urgency: Secondary | ICD-10-CM | POA: Diagnosis present

## 2017-01-18 DIAGNOSIS — Z794 Long term (current) use of insulin: Secondary | ICD-10-CM | POA: Diagnosis not present

## 2017-01-18 DIAGNOSIS — K5900 Constipation, unspecified: Secondary | ICD-10-CM | POA: Diagnosis present

## 2017-01-18 DIAGNOSIS — S62306A Unspecified fracture of fifth metacarpal bone, right hand, initial encounter for closed fracture: Secondary | ICD-10-CM | POA: Diagnosis present

## 2017-01-18 DIAGNOSIS — S62344A Nondisplaced fracture of base of fourth metacarpal bone, right hand, initial encounter for closed fracture: Secondary | ICD-10-CM | POA: Diagnosis not present

## 2017-01-18 DIAGNOSIS — S62304A Unspecified fracture of fourth metacarpal bone, right hand, initial encounter for closed fracture: Secondary | ICD-10-CM | POA: Diagnosis present

## 2017-01-18 DIAGNOSIS — M25651 Stiffness of right hip, not elsewhere classified: Secondary | ICD-10-CM | POA: Diagnosis not present

## 2017-01-18 DIAGNOSIS — W19XXXA Unspecified fall, initial encounter: Secondary | ICD-10-CM | POA: Diagnosis not present

## 2017-01-18 DIAGNOSIS — I517 Cardiomegaly: Secondary | ICD-10-CM | POA: Diagnosis not present

## 2017-01-18 DIAGNOSIS — Z741 Need for assistance with personal care: Secondary | ICD-10-CM | POA: Diagnosis not present

## 2017-01-23 DIAGNOSIS — M79674 Pain in right toe(s): Secondary | ICD-10-CM | POA: Diagnosis not present

## 2017-01-23 DIAGNOSIS — M79675 Pain in left toe(s): Secondary | ICD-10-CM | POA: Diagnosis not present

## 2017-01-23 DIAGNOSIS — M25652 Stiffness of left hip, not elsewhere classified: Secondary | ICD-10-CM | POA: Diagnosis not present

## 2017-01-23 DIAGNOSIS — B351 Tinea unguium: Secondary | ICD-10-CM | POA: Diagnosis not present

## 2017-01-23 DIAGNOSIS — R2681 Unsteadiness on feet: Secondary | ICD-10-CM | POA: Diagnosis not present

## 2017-01-23 DIAGNOSIS — M79651 Pain in right thigh: Secondary | ICD-10-CM | POA: Diagnosis not present

## 2017-01-23 DIAGNOSIS — S52611S Displaced fracture of right ulna styloid process, sequela: Secondary | ICD-10-CM | POA: Diagnosis not present

## 2017-01-23 DIAGNOSIS — Z741 Need for assistance with personal care: Secondary | ICD-10-CM | POA: Diagnosis not present

## 2017-01-23 DIAGNOSIS — M7731 Calcaneal spur, right foot: Secondary | ICD-10-CM | POA: Diagnosis not present

## 2017-01-23 DIAGNOSIS — M6281 Muscle weakness (generalized): Secondary | ICD-10-CM | POA: Diagnosis not present

## 2017-01-23 DIAGNOSIS — R278 Other lack of coordination: Secondary | ICD-10-CM | POA: Diagnosis not present

## 2017-01-23 DIAGNOSIS — M25651 Stiffness of right hip, not elsewhere classified: Secondary | ICD-10-CM | POA: Diagnosis not present

## 2017-01-23 DIAGNOSIS — M25531 Pain in right wrist: Secondary | ICD-10-CM | POA: Diagnosis not present

## 2017-01-23 DIAGNOSIS — S62306D Unspecified fracture of fifth metacarpal bone, right hand, subsequent encounter for fracture with routine healing: Secondary | ICD-10-CM | POA: Diagnosis not present

## 2017-01-23 DIAGNOSIS — E119 Type 2 diabetes mellitus without complications: Secondary | ICD-10-CM | POA: Diagnosis not present

## 2017-01-23 DIAGNOSIS — E089 Diabetes mellitus due to underlying condition without complications: Secondary | ICD-10-CM | POA: Diagnosis not present

## 2017-01-23 DIAGNOSIS — M79661 Pain in right lower leg: Secondary | ICD-10-CM | POA: Diagnosis not present

## 2017-01-23 DIAGNOSIS — J9600 Acute respiratory failure, unspecified whether with hypoxia or hypercapnia: Secondary | ICD-10-CM | POA: Diagnosis not present

## 2017-01-23 DIAGNOSIS — S52561A Barton's fracture of right radius, initial encounter for closed fracture: Secondary | ICD-10-CM | POA: Diagnosis not present

## 2017-01-23 DIAGNOSIS — I10 Essential (primary) hypertension: Secondary | ICD-10-CM | POA: Diagnosis not present

## 2017-01-23 DIAGNOSIS — I7091 Generalized atherosclerosis: Secondary | ICD-10-CM | POA: Diagnosis not present

## 2017-01-23 DIAGNOSIS — S62324A Displaced fracture of shaft of fourth metacarpal bone, right hand, initial encounter for closed fracture: Secondary | ICD-10-CM | POA: Diagnosis not present

## 2017-01-23 DIAGNOSIS — W108XXA Fall (on) (from) other stairs and steps, initial encounter: Secondary | ICD-10-CM | POA: Diagnosis not present

## 2017-01-23 DIAGNOSIS — Z9181 History of falling: Secondary | ICD-10-CM | POA: Diagnosis not present

## 2017-01-23 DIAGNOSIS — S62306A Unspecified fracture of fifth metacarpal bone, right hand, initial encounter for closed fracture: Secondary | ICD-10-CM | POA: Diagnosis not present

## 2017-01-24 DIAGNOSIS — Z9181 History of falling: Secondary | ICD-10-CM | POA: Diagnosis not present

## 2017-01-24 DIAGNOSIS — E119 Type 2 diabetes mellitus without complications: Secondary | ICD-10-CM | POA: Diagnosis not present

## 2017-01-24 DIAGNOSIS — I10 Essential (primary) hypertension: Secondary | ICD-10-CM | POA: Diagnosis not present

## 2017-01-24 DIAGNOSIS — S52611S Displaced fracture of right ulna styloid process, sequela: Secondary | ICD-10-CM | POA: Diagnosis not present

## 2017-01-25 DIAGNOSIS — S62306D Unspecified fracture of fifth metacarpal bone, right hand, subsequent encounter for fracture with routine healing: Secondary | ICD-10-CM | POA: Diagnosis not present

## 2017-01-25 DIAGNOSIS — I10 Essential (primary) hypertension: Secondary | ICD-10-CM | POA: Diagnosis not present

## 2017-01-25 DIAGNOSIS — Z9181 History of falling: Secondary | ICD-10-CM | POA: Diagnosis not present

## 2017-01-25 DIAGNOSIS — M6281 Muscle weakness (generalized): Secondary | ICD-10-CM | POA: Diagnosis not present

## 2017-01-25 DIAGNOSIS — S52611S Displaced fracture of right ulna styloid process, sequela: Secondary | ICD-10-CM | POA: Diagnosis not present

## 2017-01-25 DIAGNOSIS — E119 Type 2 diabetes mellitus without complications: Secondary | ICD-10-CM | POA: Diagnosis not present

## 2017-01-25 DIAGNOSIS — J9600 Acute respiratory failure, unspecified whether with hypoxia or hypercapnia: Secondary | ICD-10-CM | POA: Diagnosis not present

## 2017-01-30 DIAGNOSIS — Z9181 History of falling: Secondary | ICD-10-CM | POA: Diagnosis not present

## 2017-01-30 DIAGNOSIS — E119 Type 2 diabetes mellitus without complications: Secondary | ICD-10-CM | POA: Diagnosis not present

## 2017-01-30 DIAGNOSIS — I10 Essential (primary) hypertension: Secondary | ICD-10-CM | POA: Diagnosis not present

## 2017-01-30 DIAGNOSIS — J9600 Acute respiratory failure, unspecified whether with hypoxia or hypercapnia: Secondary | ICD-10-CM | POA: Diagnosis not present

## 2017-01-30 DIAGNOSIS — S52611S Displaced fracture of right ulna styloid process, sequela: Secondary | ICD-10-CM | POA: Diagnosis not present

## 2017-01-30 DIAGNOSIS — S62306D Unspecified fracture of fifth metacarpal bone, right hand, subsequent encounter for fracture with routine healing: Secondary | ICD-10-CM | POA: Diagnosis not present

## 2017-01-30 DIAGNOSIS — M6281 Muscle weakness (generalized): Secondary | ICD-10-CM | POA: Diagnosis not present

## 2017-02-01 DIAGNOSIS — J9600 Acute respiratory failure, unspecified whether with hypoxia or hypercapnia: Secondary | ICD-10-CM | POA: Diagnosis not present

## 2017-02-01 DIAGNOSIS — M6281 Muscle weakness (generalized): Secondary | ICD-10-CM | POA: Diagnosis not present

## 2017-02-01 DIAGNOSIS — S52611S Displaced fracture of right ulna styloid process, sequela: Secondary | ICD-10-CM | POA: Diagnosis not present

## 2017-02-01 DIAGNOSIS — I10 Essential (primary) hypertension: Secondary | ICD-10-CM | POA: Diagnosis not present

## 2017-02-01 DIAGNOSIS — S62306D Unspecified fracture of fifth metacarpal bone, right hand, subsequent encounter for fracture with routine healing: Secondary | ICD-10-CM | POA: Diagnosis not present

## 2017-02-01 DIAGNOSIS — Z9181 History of falling: Secondary | ICD-10-CM | POA: Diagnosis not present

## 2017-02-01 DIAGNOSIS — E119 Type 2 diabetes mellitus without complications: Secondary | ICD-10-CM | POA: Diagnosis not present

## 2017-02-03 DIAGNOSIS — Z9181 History of falling: Secondary | ICD-10-CM | POA: Diagnosis not present

## 2017-02-03 DIAGNOSIS — I10 Essential (primary) hypertension: Secondary | ICD-10-CM | POA: Diagnosis not present

## 2017-02-03 DIAGNOSIS — S52611S Displaced fracture of right ulna styloid process, sequela: Secondary | ICD-10-CM | POA: Diagnosis not present

## 2017-02-03 DIAGNOSIS — E119 Type 2 diabetes mellitus without complications: Secondary | ICD-10-CM | POA: Diagnosis not present

## 2017-02-06 DIAGNOSIS — Z9181 History of falling: Secondary | ICD-10-CM | POA: Diagnosis not present

## 2017-02-06 DIAGNOSIS — E119 Type 2 diabetes mellitus without complications: Secondary | ICD-10-CM | POA: Diagnosis not present

## 2017-02-06 DIAGNOSIS — M6281 Muscle weakness (generalized): Secondary | ICD-10-CM | POA: Diagnosis not present

## 2017-02-06 DIAGNOSIS — S62306D Unspecified fracture of fifth metacarpal bone, right hand, subsequent encounter for fracture with routine healing: Secondary | ICD-10-CM | POA: Diagnosis not present

## 2017-02-06 DIAGNOSIS — J9600 Acute respiratory failure, unspecified whether with hypoxia or hypercapnia: Secondary | ICD-10-CM | POA: Diagnosis not present

## 2017-02-06 DIAGNOSIS — S52611S Displaced fracture of right ulna styloid process, sequela: Secondary | ICD-10-CM | POA: Diagnosis not present

## 2017-02-06 DIAGNOSIS — I10 Essential (primary) hypertension: Secondary | ICD-10-CM | POA: Diagnosis not present

## 2017-02-08 DIAGNOSIS — E119 Type 2 diabetes mellitus without complications: Secondary | ICD-10-CM | POA: Diagnosis not present

## 2017-02-08 DIAGNOSIS — S62306D Unspecified fracture of fifth metacarpal bone, right hand, subsequent encounter for fracture with routine healing: Secondary | ICD-10-CM | POA: Diagnosis not present

## 2017-02-08 DIAGNOSIS — J9600 Acute respiratory failure, unspecified whether with hypoxia or hypercapnia: Secondary | ICD-10-CM | POA: Diagnosis not present

## 2017-02-08 DIAGNOSIS — I10 Essential (primary) hypertension: Secondary | ICD-10-CM | POA: Diagnosis not present

## 2017-02-08 DIAGNOSIS — Z9181 History of falling: Secondary | ICD-10-CM | POA: Diagnosis not present

## 2017-02-08 DIAGNOSIS — S52611S Displaced fracture of right ulna styloid process, sequela: Secondary | ICD-10-CM | POA: Diagnosis not present

## 2017-02-08 DIAGNOSIS — M6281 Muscle weakness (generalized): Secondary | ICD-10-CM | POA: Diagnosis not present

## 2017-02-10 DIAGNOSIS — M79675 Pain in left toe(s): Secondary | ICD-10-CM | POA: Diagnosis not present

## 2017-02-10 DIAGNOSIS — B351 Tinea unguium: Secondary | ICD-10-CM | POA: Diagnosis not present

## 2017-02-10 DIAGNOSIS — I10 Essential (primary) hypertension: Secondary | ICD-10-CM | POA: Diagnosis not present

## 2017-02-10 DIAGNOSIS — Z9181 History of falling: Secondary | ICD-10-CM | POA: Diagnosis not present

## 2017-02-10 DIAGNOSIS — M25531 Pain in right wrist: Secondary | ICD-10-CM | POA: Diagnosis not present

## 2017-02-10 DIAGNOSIS — E089 Diabetes mellitus due to underlying condition without complications: Secondary | ICD-10-CM | POA: Diagnosis not present

## 2017-02-10 DIAGNOSIS — S62324A Displaced fracture of shaft of fourth metacarpal bone, right hand, initial encounter for closed fracture: Secondary | ICD-10-CM | POA: Diagnosis not present

## 2017-02-10 DIAGNOSIS — W108XXA Fall (on) (from) other stairs and steps, initial encounter: Secondary | ICD-10-CM | POA: Diagnosis not present

## 2017-02-10 DIAGNOSIS — S62306A Unspecified fracture of fifth metacarpal bone, right hand, initial encounter for closed fracture: Secondary | ICD-10-CM | POA: Diagnosis not present

## 2017-02-10 DIAGNOSIS — E119 Type 2 diabetes mellitus without complications: Secondary | ICD-10-CM | POA: Diagnosis not present

## 2017-02-10 DIAGNOSIS — M79674 Pain in right toe(s): Secondary | ICD-10-CM | POA: Diagnosis not present

## 2017-02-10 DIAGNOSIS — I7091 Generalized atherosclerosis: Secondary | ICD-10-CM | POA: Diagnosis not present

## 2017-02-10 DIAGNOSIS — S52611S Displaced fracture of right ulna styloid process, sequela: Secondary | ICD-10-CM | POA: Diagnosis not present

## 2017-02-13 DIAGNOSIS — Z9181 History of falling: Secondary | ICD-10-CM | POA: Diagnosis not present

## 2017-02-13 DIAGNOSIS — S62306D Unspecified fracture of fifth metacarpal bone, right hand, subsequent encounter for fracture with routine healing: Secondary | ICD-10-CM | POA: Diagnosis not present

## 2017-02-13 DIAGNOSIS — J9600 Acute respiratory failure, unspecified whether with hypoxia or hypercapnia: Secondary | ICD-10-CM | POA: Diagnosis not present

## 2017-02-13 DIAGNOSIS — M6281 Muscle weakness (generalized): Secondary | ICD-10-CM | POA: Diagnosis not present

## 2017-02-13 DIAGNOSIS — I10 Essential (primary) hypertension: Secondary | ICD-10-CM | POA: Diagnosis not present

## 2017-02-13 DIAGNOSIS — S52611S Displaced fracture of right ulna styloid process, sequela: Secondary | ICD-10-CM | POA: Diagnosis not present

## 2017-02-13 DIAGNOSIS — E119 Type 2 diabetes mellitus without complications: Secondary | ICD-10-CM | POA: Diagnosis not present

## 2017-03-06 DIAGNOSIS — E559 Vitamin D deficiency, unspecified: Secondary | ICD-10-CM | POA: Diagnosis not present

## 2017-03-06 DIAGNOSIS — E790 Hyperuricemia without signs of inflammatory arthritis and tophaceous disease: Secondary | ICD-10-CM | POA: Diagnosis not present

## 2017-03-06 DIAGNOSIS — M25531 Pain in right wrist: Secondary | ICD-10-CM | POA: Diagnosis not present

## 2017-03-06 DIAGNOSIS — M4802 Spinal stenosis, cervical region: Secondary | ICD-10-CM | POA: Diagnosis not present

## 2017-03-06 DIAGNOSIS — D649 Anemia, unspecified: Secondary | ICD-10-CM | POA: Diagnosis not present

## 2017-03-06 DIAGNOSIS — I10 Essential (primary) hypertension: Secondary | ICD-10-CM | POA: Diagnosis not present

## 2017-03-06 DIAGNOSIS — E1165 Type 2 diabetes mellitus with hyperglycemia: Secondary | ICD-10-CM | POA: Diagnosis not present

## 2017-03-10 DIAGNOSIS — S62326D Displaced fracture of shaft of fifth metacarpal bone, right hand, subsequent encounter for fracture with routine healing: Secondary | ICD-10-CM | POA: Diagnosis not present

## 2017-03-10 DIAGNOSIS — S62324D Displaced fracture of shaft of fourth metacarpal bone, right hand, subsequent encounter for fracture with routine healing: Secondary | ICD-10-CM | POA: Diagnosis not present

## 2017-03-10 DIAGNOSIS — W108XXD Fall (on) (from) other stairs and steps, subsequent encounter: Secondary | ICD-10-CM | POA: Diagnosis not present

## 2017-03-30 DIAGNOSIS — R5383 Other fatigue: Secondary | ICD-10-CM | POA: Diagnosis not present

## 2017-03-30 DIAGNOSIS — I10 Essential (primary) hypertension: Secondary | ICD-10-CM | POA: Diagnosis not present

## 2017-03-30 DIAGNOSIS — M792 Neuralgia and neuritis, unspecified: Secondary | ICD-10-CM | POA: Diagnosis not present

## 2017-03-30 DIAGNOSIS — E119 Type 2 diabetes mellitus without complications: Secondary | ICD-10-CM | POA: Diagnosis not present

## 2017-03-30 DIAGNOSIS — M25531 Pain in right wrist: Secondary | ICD-10-CM | POA: Diagnosis not present

## 2017-03-30 DIAGNOSIS — M109 Gout, unspecified: Secondary | ICD-10-CM | POA: Diagnosis not present

## 2017-04-06 DIAGNOSIS — S62101G Fracture of unspecified carpal bone, right wrist, subsequent encounter for fracture with delayed healing: Secondary | ICD-10-CM | POA: Diagnosis not present

## 2017-04-10 DIAGNOSIS — S62101G Fracture of unspecified carpal bone, right wrist, subsequent encounter for fracture with delayed healing: Secondary | ICD-10-CM | POA: Diagnosis not present

## 2017-04-27 DIAGNOSIS — G5791 Unspecified mononeuropathy of right lower limb: Secondary | ICD-10-CM | POA: Diagnosis not present

## 2017-04-27 DIAGNOSIS — E119 Type 2 diabetes mellitus without complications: Secondary | ICD-10-CM | POA: Diagnosis not present

## 2017-05-10 DIAGNOSIS — B029 Zoster without complications: Secondary | ICD-10-CM | POA: Diagnosis not present

## 2017-05-11 DIAGNOSIS — E1165 Type 2 diabetes mellitus with hyperglycemia: Secondary | ICD-10-CM | POA: Diagnosis not present

## 2017-05-11 DIAGNOSIS — E785 Hyperlipidemia, unspecified: Secondary | ICD-10-CM | POA: Diagnosis not present

## 2017-05-11 DIAGNOSIS — I10 Essential (primary) hypertension: Secondary | ICD-10-CM | POA: Diagnosis not present

## 2017-06-12 DIAGNOSIS — J90 Pleural effusion, not elsewhere classified: Secondary | ICD-10-CM | POA: Diagnosis not present

## 2017-06-12 DIAGNOSIS — E785 Hyperlipidemia, unspecified: Secondary | ICD-10-CM | POA: Diagnosis present

## 2017-06-12 DIAGNOSIS — I509 Heart failure, unspecified: Secondary | ICD-10-CM | POA: Diagnosis not present

## 2017-06-12 DIAGNOSIS — R0600 Dyspnea, unspecified: Secondary | ICD-10-CM | POA: Diagnosis not present

## 2017-06-12 DIAGNOSIS — R05 Cough: Secondary | ICD-10-CM | POA: Diagnosis not present

## 2017-06-12 DIAGNOSIS — I429 Cardiomyopathy, unspecified: Secondary | ICD-10-CM | POA: Diagnosis present

## 2017-06-12 DIAGNOSIS — J810 Acute pulmonary edema: Secondary | ICD-10-CM | POA: Diagnosis not present

## 2017-06-12 DIAGNOSIS — M48061 Spinal stenosis, lumbar region without neurogenic claudication: Secondary | ICD-10-CM | POA: Diagnosis not present

## 2017-06-12 DIAGNOSIS — M5127 Other intervertebral disc displacement, lumbosacral region: Secondary | ICD-10-CM | POA: Diagnosis not present

## 2017-06-12 DIAGNOSIS — M4800 Spinal stenosis, site unspecified: Secondary | ICD-10-CM | POA: Diagnosis present

## 2017-06-12 DIAGNOSIS — M5416 Radiculopathy, lumbar region: Secondary | ICD-10-CM | POA: Diagnosis present

## 2017-06-12 DIAGNOSIS — M79605 Pain in left leg: Secondary | ICD-10-CM | POA: Diagnosis not present

## 2017-06-12 DIAGNOSIS — I10 Essential (primary) hypertension: Secondary | ICD-10-CM | POA: Diagnosis not present

## 2017-06-12 DIAGNOSIS — M5126 Other intervertebral disc displacement, lumbar region: Secondary | ICD-10-CM | POA: Diagnosis not present

## 2017-06-12 DIAGNOSIS — N179 Acute kidney failure, unspecified: Secondary | ICD-10-CM | POA: Diagnosis not present

## 2017-06-12 DIAGNOSIS — E119 Type 2 diabetes mellitus without complications: Secondary | ICD-10-CM | POA: Diagnosis present

## 2017-06-12 DIAGNOSIS — Z91040 Latex allergy status: Secondary | ICD-10-CM | POA: Diagnosis not present

## 2017-06-12 DIAGNOSIS — J9811 Atelectasis: Secondary | ICD-10-CM | POA: Diagnosis not present

## 2017-06-12 DIAGNOSIS — I110 Hypertensive heart disease with heart failure: Secondary | ICD-10-CM | POA: Diagnosis present

## 2017-06-12 DIAGNOSIS — I5023 Acute on chronic systolic (congestive) heart failure: Secondary | ICD-10-CM | POA: Diagnosis present

## 2017-06-12 DIAGNOSIS — I5022 Chronic systolic (congestive) heart failure: Secondary | ICD-10-CM | POA: Diagnosis not present

## 2017-06-12 DIAGNOSIS — R079 Chest pain, unspecified: Secondary | ICD-10-CM | POA: Diagnosis not present

## 2017-06-12 DIAGNOSIS — J811 Chronic pulmonary edema: Secondary | ICD-10-CM | POA: Diagnosis not present

## 2017-06-12 DIAGNOSIS — I5021 Acute systolic (congestive) heart failure: Secondary | ICD-10-CM | POA: Diagnosis not present

## 2017-06-12 DIAGNOSIS — M79604 Pain in right leg: Secondary | ICD-10-CM | POA: Diagnosis not present

## 2017-06-12 DIAGNOSIS — R0602 Shortness of breath: Secondary | ICD-10-CM | POA: Diagnosis not present

## 2017-06-12 DIAGNOSIS — J208 Acute bronchitis due to other specified organisms: Secondary | ICD-10-CM | POA: Diagnosis not present

## 2017-06-22 DIAGNOSIS — R0609 Other forms of dyspnea: Secondary | ICD-10-CM | POA: Diagnosis not present

## 2017-06-22 DIAGNOSIS — I10 Essential (primary) hypertension: Secondary | ICD-10-CM | POA: Diagnosis not present

## 2017-06-22 DIAGNOSIS — E119 Type 2 diabetes mellitus without complications: Secondary | ICD-10-CM | POA: Diagnosis not present

## 2017-06-22 DIAGNOSIS — I428 Other cardiomyopathies: Secondary | ICD-10-CM | POA: Diagnosis not present

## 2017-06-29 DIAGNOSIS — R0602 Shortness of breath: Secondary | ICD-10-CM | POA: Diagnosis not present

## 2017-07-03 DIAGNOSIS — E119 Type 2 diabetes mellitus without complications: Secondary | ICD-10-CM | POA: Diagnosis not present

## 2017-07-03 DIAGNOSIS — I428 Other cardiomyopathies: Secondary | ICD-10-CM | POA: Diagnosis not present

## 2017-07-03 DIAGNOSIS — I10 Essential (primary) hypertension: Secondary | ICD-10-CM | POA: Diagnosis not present

## 2017-07-10 DIAGNOSIS — R0602 Shortness of breath: Secondary | ICD-10-CM | POA: Diagnosis not present

## 2017-07-10 DIAGNOSIS — E669 Obesity, unspecified: Secondary | ICD-10-CM | POA: Diagnosis not present

## 2017-07-10 DIAGNOSIS — R0609 Other forms of dyspnea: Secondary | ICD-10-CM | POA: Diagnosis not present

## 2017-07-10 DIAGNOSIS — E860 Dehydration: Secondary | ICD-10-CM | POA: Diagnosis not present

## 2017-07-10 DIAGNOSIS — I509 Heart failure, unspecified: Secondary | ICD-10-CM | POA: Diagnosis not present

## 2017-07-10 DIAGNOSIS — E7800 Pure hypercholesterolemia, unspecified: Secondary | ICD-10-CM | POA: Diagnosis not present

## 2017-07-10 DIAGNOSIS — I10 Essential (primary) hypertension: Secondary | ICD-10-CM | POA: Diagnosis not present

## 2017-07-17 DIAGNOSIS — R10816 Epigastric abdominal tenderness: Secondary | ICD-10-CM | POA: Diagnosis not present

## 2017-07-17 DIAGNOSIS — J209 Acute bronchitis, unspecified: Secondary | ICD-10-CM | POA: Diagnosis not present

## 2017-07-17 DIAGNOSIS — E119 Type 2 diabetes mellitus without complications: Secondary | ICD-10-CM | POA: Diagnosis not present

## 2017-07-17 DIAGNOSIS — M5020 Other cervical disc displacement, unspecified cervical region: Secondary | ICD-10-CM | POA: Diagnosis not present

## 2017-07-17 DIAGNOSIS — I10 Essential (primary) hypertension: Secondary | ICD-10-CM | POA: Diagnosis not present

## 2017-07-17 DIAGNOSIS — K219 Gastro-esophageal reflux disease without esophagitis: Secondary | ICD-10-CM | POA: Diagnosis not present

## 2017-07-18 DIAGNOSIS — E669 Obesity, unspecified: Secondary | ICD-10-CM | POA: Diagnosis not present

## 2017-07-18 DIAGNOSIS — I509 Heart failure, unspecified: Secondary | ICD-10-CM | POA: Diagnosis not present

## 2017-07-18 DIAGNOSIS — R1013 Epigastric pain: Secondary | ICD-10-CM | POA: Diagnosis not present

## 2017-07-18 DIAGNOSIS — R140 Abdominal distension (gaseous): Secondary | ICD-10-CM | POA: Diagnosis not present

## 2017-07-24 DIAGNOSIS — K760 Fatty (change of) liver, not elsewhere classified: Secondary | ICD-10-CM | POA: Diagnosis not present

## 2017-07-28 DIAGNOSIS — R1013 Epigastric pain: Secondary | ICD-10-CM | POA: Diagnosis not present

## 2017-07-28 DIAGNOSIS — K760 Fatty (change of) liver, not elsewhere classified: Secondary | ICD-10-CM | POA: Diagnosis not present

## 2017-09-19 ENCOUNTER — Emergency Department (HOSPITAL_COMMUNITY)
Admission: EM | Admit: 2017-09-19 | Discharge: 2017-09-19 | Disposition: A | Discharge status: Home / Self Care | Payer: Medicare HMO | Attending: Emergency Medicine | Admitting: Emergency Medicine

## 2017-09-19 ENCOUNTER — Encounter (HOSPITAL_COMMUNITY): Payer: Self-pay | Admitting: *Deleted

## 2017-09-19 ENCOUNTER — Emergency Department (HOSPITAL_BASED_OUTPATIENT_CLINIC_OR_DEPARTMENT_OTHER): Payer: Medicare HMO

## 2017-09-19 ENCOUNTER — Emergency Department (HOSPITAL_COMMUNITY): Payer: Medicare HMO

## 2017-09-19 DIAGNOSIS — Z79899 Other long term (current) drug therapy: Secondary | ICD-10-CM | POA: Diagnosis not present

## 2017-09-19 DIAGNOSIS — R0789 Other chest pain: Secondary | ICD-10-CM | POA: Insufficient documentation

## 2017-09-19 DIAGNOSIS — I509 Heart failure, unspecified: Secondary | ICD-10-CM | POA: Diagnosis not present

## 2017-09-19 DIAGNOSIS — M7989 Other specified soft tissue disorders: Secondary | ICD-10-CM

## 2017-09-19 DIAGNOSIS — Z7982 Long term (current) use of aspirin: Secondary | ICD-10-CM | POA: Insufficient documentation

## 2017-09-19 DIAGNOSIS — E1169 Type 2 diabetes mellitus with other specified complication: Secondary | ICD-10-CM

## 2017-09-19 DIAGNOSIS — I110 Hypertensive heart disease with heart failure: Secondary | ICD-10-CM | POA: Insufficient documentation

## 2017-09-19 DIAGNOSIS — M79604 Pain in right leg: Secondary | ICD-10-CM

## 2017-09-19 DIAGNOSIS — Z91040 Latex allergy status: Secondary | ICD-10-CM | POA: Insufficient documentation

## 2017-09-19 DIAGNOSIS — R0602 Shortness of breath: Secondary | ICD-10-CM | POA: Diagnosis present

## 2017-09-19 DIAGNOSIS — R739 Hyperglycemia, unspecified: Secondary | ICD-10-CM

## 2017-09-19 DIAGNOSIS — Z794 Long term (current) use of insulin: Secondary | ICD-10-CM | POA: Diagnosis not present

## 2017-09-19 DIAGNOSIS — E1165 Type 2 diabetes mellitus with hyperglycemia: Secondary | ICD-10-CM | POA: Diagnosis not present

## 2017-09-19 LAB — BASIC METABOLIC PANEL
Anion gap: 9 (ref 5–15)
BUN: 36 mg/dL — AB (ref 6–20)
CHLORIDE: 98 mmol/L — AB (ref 101–111)
CO2: 24 mmol/L (ref 22–32)
CREATININE: 1.16 mg/dL — AB (ref 0.44–1.00)
Calcium: 9.9 mg/dL (ref 8.9–10.3)
GFR calc Af Amer: 57 mL/min — ABNORMAL LOW (ref >60)
GFR calc non Af Amer: 49 mL/min — ABNORMAL LOW (ref >60)
Glucose, Bld: 523 mg/dL (ref 65–99)
Potassium: 4.4 mmol/L (ref 3.5–5.1)
Sodium: 131 mmol/L — ABNORMAL LOW (ref 135–145)

## 2017-09-19 LAB — HEPATIC FUNCTION PANEL
ALT: 18 U/L (ref 14–54)
AST: 14 U/L — ABNORMAL LOW (ref 15–41)
Albumin: 3.3 g/dL — ABNORMAL LOW (ref 3.5–5.0)
Alkaline Phosphatase: 76 U/L (ref 38–126)
Bilirubin, Direct: <0.1 mg/dL — ABNORMAL LOW (ref 0.1–0.5)
Total Bilirubin: 0.4 mg/dL (ref 0.3–1.2)
Total Protein: 6.5 g/dL (ref 6.5–8.1)

## 2017-09-19 LAB — I-STAT TROPONIN, ED
Troponin i, poc: 0 ng/mL (ref 0.00–0.08)
Troponin i, poc: 0.01 ng/mL (ref 0.00–0.08)

## 2017-09-19 LAB — CBC
HCT: 39.9 % (ref 36.0–46.0)
Hemoglobin: 13.4 g/dL (ref 12.0–15.0)
MCH: 27.7 pg (ref 26.0–34.0)
MCHC: 33.6 g/dL (ref 30.0–36.0)
MCV: 82.4 fL (ref 78.0–100.0)
PLATELETS: 270 10*3/uL (ref 150–400)
RBC: 4.84 MIL/uL (ref 3.87–5.11)
RDW: 13.2 % (ref 11.5–15.5)
WBC: 5.3 10*3/uL (ref 4.0–10.5)

## 2017-09-19 LAB — D-DIMER, QUANTITATIVE (NOT AT ARMC): D-Dimer, Quant: 0.41 ug/mL-FEU (ref 0.00–0.50)

## 2017-09-19 LAB — CBG MONITORING, ED: Glucose-Capillary: 338 mg/dL — ABNORMAL HIGH (ref 65–99)

## 2017-09-19 LAB — BRAIN NATRIURETIC PEPTIDE: B Natriuretic Peptide: 56.3 pg/mL (ref 0.0–100.0)

## 2017-09-19 LAB — LIPASE, BLOOD: Lipase: 42 U/L (ref 11–51)

## 2017-09-19 MED ORDER — HYDROMORPHONE HCL 2 MG/ML IJ SOLN
0.5000 mg | Freq: Once | INTRAMUSCULAR | Status: AC
Start: 2017-09-19 — End: 2017-09-19
  Administered 2017-09-19: 0.5 mg via INTRAVENOUS
  Filled 2017-09-19: qty 1

## 2017-09-19 MED ORDER — INSULIN GLARGINE 100 UNIT/ML ~~LOC~~ SOLN: 35.0000 [IU] | Freq: Every day | SUBCUTANEOUS | 0 refills | Status: DC

## 2017-09-19 MED ORDER — LANCET DEVICE MISC: 0 refills | Status: DC

## 2017-09-19 MED ORDER — SODIUM CHLORIDE 0.9 % IV BOLUS
500.0000 mL | Freq: Once | INTRAVENOUS | Status: AC
  Administered 2017-09-19: 500 mL via INTRAVENOUS

## 2017-09-19 MED ORDER — INSULIN ASPART PROT & ASPART (70-30 MIX) 100 UNIT/ML ~~LOC~~ SUSP
10.0000 [IU] | Freq: Once | SUBCUTANEOUS | Status: AC
  Administered 2017-09-19: 10 [IU] via SUBCUTANEOUS
  Filled 2017-09-19: qty 10

## 2017-09-19 MED ORDER — INSULIN ASPART 100 UNIT/ML ~~LOC~~ SOLN: 10.0000 [IU] | Freq: Three times a day (TID) | SUBCUTANEOUS | 0 refills | Status: DC

## 2017-09-19 NOTE — ED Notes (Signed)
Pt ambulated and had no issues with gait. O2 stayed at 100 and dipped down once to 98 while walking. Notified Hannie, RN.

## 2017-09-19 NOTE — Progress Notes (Signed)
RLE venous duplex prelim: negative for DVT.  Ayinde Swim Eunice, RDMS, RVT  

## 2017-09-19 NOTE — ED Provider Notes (Signed)
[facility] EMERGENCY DEPARTMENT Provider Note   CSN: [id number] Arrival date & time: 09/19/17  1224     History   Chief Complaint Chief Complaint  Patient presents with  . Shortness of Breath    HPI Kara Hanson is a 62 y.o. female with history of CHF, CVA, diabetes mellitus, hypercholesterolemia, hypertension, and spinal stenosis presents for evaluation of gradual onset, per aggressively worsening shortness of breath for 2 days.  She notes significant dyspnea on exertion and states that she is barely able to move from one room to another room in her house without feeling very short of breath and needing to take a break.  She also endorses orthopnea and PND.  For the past 2 days she has also had dull pain in her anterior chest with inspiration only.  She denies any recent surgeries, hemoptysis, or prior history of DVT or PE and she does not take estrogen for placement therapy.  However, she did recently travel from [place] to [place] via train which was a 9-hour trip 1 week ago.  She states that she did attempt to walk around frequently while on the train.  She notes bilateral lower extremity edema which is chronic and unchanged.  She also has a chronic constant burning sensation radiating down the anterior aspect of the  right lower extremity.  She states that this is chronic secondary to her known spinal stenosis but states that this has been worsening over the past few weeks.  Pain also worsens with palpation.  She has been taking her home medicines including gabapentin, carvedilol, Lasix.  She states that she has been out of all of her insulin for 1 to 2 weeks and her PCP in [place] has not refilled her medication.  The history is provided by the patient.    Past Medical History:  Diagnosis Date  . CHF (congestive heart failure) ([place])   . CVA (cerebral vascular accident) ([place])   . Diabetes mellitus without complication ([place])   . Hypercholesteremia   .  Hypertension   . Myocardial infarction ([place])   . Spinal stenosis     Patient Active Problem List   Diagnosis Date Noted  . HLD (hyperlipidemia) 09/30/2016  . Chest pain 06/17/2016  . Stress-induced cardiomyopathy 05/19/2016  . Type 2 diabetes mellitus with diabetic neuropathy, unspecified ([place]) 05/06/2016  . Vertigo 05/06/2016  . Controlled type 2 diabetes mellitus with hyperglycemia ([place]) 04/23/2016  . Hypertension 04/23/2016  . Normochromic normocytic anemia 04/23/2016  . Syncope 04/23/2016    Past Surgical History:  Procedure Laterality Date  . BLADDER SURGERY    . CARDIAC CATHETERIZATION N/A 04/25/2016   Procedure: Left Heart Cath and Coronary Angiography;  Surgeon: Lorretta Harp, MD;  Location: [facility];  Service: Cardiovascular;  Laterality: N/A;  . CESAREAN SECTION    . CHOLECYSTECTOMY       OB History   None      Home Medications    Prior to Admission medications   Medication Sig Start Date End Date Taking? Authorizing Provider  allopurinol (ZYLOPRIM) 300 MG tablet Take 300 mg by mouth daily.   Yes [provider]  aspirin EC 81 MG tablet Take 81 mg by mouth daily.   Yes [provider]  carvedilol (COREG) 12.5 MG tablet Take 1 tablet (12.5 mg total) by mouth 2 (two) times daily with a meal. 05/19/16  Yes Jerline Pain, MD  enalapril (VASOTEC) 10 MG tablet Take 10 mg by  mouth daily.   Yes [provider]  furosemide (LASIX) 40 MG tablet Take 1 tablet (40 mg total) by mouth daily. 08/26/16  Yes [name], MD  gabapentin (NEURONTIN) 300 MG capsule Take 1 capsule (300 mg total) by mouth 2 (two) times daily. Patient taking differently: Take 300 mg by mouth 3 (three) times daily.  11/02/16  Yes [name], MD  ibuprofen (ADVIL,MOTRIN) 200 MG tablet Take 200-600 mg by mouth every 6 (six) hours as needed for headache, mild pain or moderate pain.    Yes [provider]  lansoprazole (PREVACID) 15 MG capsule Take 15  mg by mouth daily.   Yes [provider]  meclizine (ANTIVERT) 25 MG tablet Take 1 tablet (25 mg total) by mouth 2 (two) times daily as needed. Patient taking differently: Take 25 mg by mouth 2 (two) times daily as needed (vertigo).  05/06/16  Yes [name], MD  Multiple Vitamin (MULTIVITAMIN WITH MINERALS) TABS tablet Take 1 tablet by mouth daily. Women's One a Day   Yes [provider]  pantoprazole (PROTONIX) 40 MG tablet Take 1 tablet (40 mg total) by mouth 2 (two) times daily. 10/01/16  Yes Molt, Bethany, DO  pravastatin (PRAVACHOL) 40 MG tablet Take 1 tablet (40 mg total) by mouth every evening. Patient taking differently: Take 40 mg by mouth at bedtime.  05/19/16  Yes Jerline Pain, MD  alum & mag hydroxide-simeth (MAALOX MAX) 027-741-28 MG/5ML suspension Take 10 mLs by mouth every 6 (six) hours as needed for indigestion (chest pain). Patient not taking: Reported on 09/19/2017 10/01/16   Molt, Bethany, DO  glucose blood (ACCU-CHEK AVIVA) test strip Use as instructed 3 times daily 05/06/16   [name], MD  insulin aspart (NOVOLOG) 100 UNIT/ML injection Inject 10 Units into the skin 3 (three) times daily before meals. 09/19/17   Anna Beaird A, PA-C  insulin glargine (LANTUS) 100 UNIT/ML injection Inject 0.35 mLs (35 Units total) into the skin at bedtime. 09/19/17   Kewan Mcnease A, PA-C  insulin lispro (HUMALOG) 100 UNIT/ML injection Inject 0.1 mLs (10 Units total) into the skin 3 (three) times daily with meals. Patient not taking: Reported on 09/19/2017 10/28/16   [name], MD  Lancet Device MISC Use as instructed 3 times daily 09/19/17   Rodell Perna A, PA-C  losartan (COZAAR) 50 MG tablet Take 1 tablet (50 mg total) by mouth daily. Patient not taking: Reported on 09/19/2017 05/19/16   Jerline Pain, MD    Family History Family History  Problem Relation Age of Onset  . Diabetes Mellitus II Father   . Stroke Father     Social History Social History   Tobacco  Use  . Smoking status: Never Smoker  . Smokeless tobacco: Never Used  Substance Use Topics  . Alcohol use: No  . Drug use: No     Allergies   Garlic; Latex; Morphine and related; and Other   Review of Systems Review of Systems  Constitutional: Negative for chills and fever.  Respiratory: Positive for shortness of breath.   Cardiovascular: Positive for chest pain and leg swelling. Negative for palpitations.  Gastrointestinal: Positive for nausea. Negative for abdominal pain, constipation, diarrhea and vomiting.  Genitourinary: Negative for hematuria.  Musculoskeletal: Positive for myalgias.  Neurological: Negative for syncope and numbness.  All other systems reviewed and are negative.    Physical Exam Updated Vital Signs BP (!) 152/53   Pulse 62   Temp 97.9 F (36.6 C) (Oral)  Resp 17   SpO2 98%   Physical Exam  Constitutional: She appears well-developed and well-nourished. No distress.  HENT:  Head: Normocephalic and atraumatic.  Eyes: Conjunctivae are normal. Right eye exhibits no discharge. Left eye exhibits no discharge.  Neck: Normal range of motion. Neck supple. No JVD present. No tracheal deviation present.  Cardiovascular: Normal rate, regular rhythm, normal heart sounds and intact distal pulses.  2+ radial and DP/PT pulses bilaterally.  Diffuse tenderness to palpation of the entirety of the right lower extremity, no palpable cords, no pitting edema, and calves appear to be symmetric in size.  Pulmonary/Chest: Effort normal and breath sounds normal. She exhibits no tenderness.  Abdominal: Soft. Bowel sounds are normal. She exhibits no distension. There is tenderness. There is no guarding.  Very mild tenderness to palpation of the epigastrium, Murphy sign absent, Rovsing's absent, no CVA tenderness  Musculoskeletal: She exhibits no edema.       Right lower leg: She exhibits tenderness. She exhibits no edema.       Left lower leg: Normal. She exhibits no  tenderness and no edema.  5/5 strength of BLE major muscle groups.  Diffuse tenderness to palpation of the right lower extremity and no apparent distribution.  Patient is quite uncomfortable even to very light touch of the right lower extremity.  Neurological: She is alert.  Skin: Skin is warm and dry. No erythema.  Psychiatric: She has a normal mood and affect. Her behavior is normal.  Nursing note and vitals reviewed.    ED Treatments / Results  Labs (all labs ordered are listed, but only abnormal results are displayed) Labs Reviewed  BASIC METABOLIC PANEL - Abnormal; Notable for the following components:      Result Value   Sodium 131 (*)    Chloride 98 (*)    Glucose, Bld 523 (*)    BUN 36 (*)    Creatinine, Ser 1.16 (*)    GFR calc non Af Amer 49 (*)    GFR calc Af Amer 57 (*)    All other components within normal limits  HEPATIC FUNCTION PANEL - Abnormal; Notable for the following components:   Albumin 3.3 (*)    AST 14 (*)    Bilirubin, Direct <0.1 (*)    All other components within normal limits  CBG MONITORING, ED - Abnormal; Notable for the following components:   Glucose-Capillary 338 (*)    All other components within normal limits  CBC  BRAIN NATRIURETIC PEPTIDE  D-DIMER, QUANTITATIVE (NOT AT [facility])  LIPASE, BLOOD  I-STAT TROPONIN, ED  I-STAT TROPONIN, ED    EKG EKG Interpretation  Date/Time:  Tuesday September 19 2017 12:37:18 EDT Ventricular Rate:  81 PR Interval:  132 QRS Duration: 84 QT Interval:  384 QTC Calculation: 446 R Axis:   59 Text Interpretation:  Normal sinus rhythm ST & T wave abnormality, consider inferolateral ischemia Abnormal ECG No significant change since last tracing Confirmed by Wandra Arthurs [phone]) on 09/19/2017 8:17:32 PM   Radiology Dg Chest 2 View  Result Date: 09/19/2017 CLINICAL DATA:  Two day history of increasing shortness of breath and midline to left-sided chest pain with intermittent cough. History of diabetes,  cardiomyopathy. Nonsmoker. EXAM: CHEST - 2 VIEW COMPARISON:  Chest x-ray and chest CT scan of September 30, 2016. FINDINGS: The lungs are adequately inflated. There is no focal infiltrate. The interstitial markings are coarse though stable. The heart is top-normal in size. The pulmonary vascularity is slightly more conspicuous  today. The trachea is midline. There is no pleural effusion. The bony thorax is unremarkable. IMPRESSION: Mild chronic bronchitic changes, stable. No acute pneumonia. Top-normal cardiac size and mild central pulmonary vascular prominence may reflect low-grade compensated CHF. Electronically Signed   By: David  Martinique M.D.   On: 09/19/2017 12:57    Procedures Procedures (including critical care time)  Medications Ordered in ED Medications  sodium chloride 0.9 % bolus 500 mL (0 mLs Intravenous Stopped 09/19/17 1738)  insulin aspart protamine- aspart (NOVOLOG MIX 70/30) injection 10 Units (10 Units Subcutaneous Given 09/19/17 1802)  HYDROmorphone (DILAUDID) injection 0.5 mg (0.5 mg Intravenous Given 09/19/17 1710)     Initial Impression / Assessment and Plan / ED Course  I have reviewed the triage vital signs and the nursing notes.  Pertinent labs & imaging results that were available during my care of the patient were reviewed by me and considered in my medical decision making (see chart for details).     Patient presents for evaluation of 2 days of anterior chest pain and shortness of breath.  She is afebrile, initially hypertensive with improvement while in the ED.  She is in no apparent distress, nontoxic in appearance.  She did recently have a long train ride 1 week ago traveling from [place] to [place].  DVT study was negative for acute DVT, d-dimer is negative and she is low risk for PE per Wells' criteria.  Serial troponins are negative and EKG shows no significant changes from last tracing.  Doubt ACS or MI.  Chest x-ray shows mild chronic bronchitic changes and mild  central pulmonary vascular prominence with borderline cardiomegaly which may reflect compensated CHF.  Her BNP is normal today.  She has mild epigastric tenderness to palpation but this appears to be chronic and her lipase is within normal limits.  She is tolerating p.o. food and fluids without difficulty.  I doubt acute intra-abdominal pathology.  Remainder of lab work reviewed by me shows no leukocytosis, no anemia, hyperglycemia with glucose 523.  She has been out of her insulin for over 1 week, CBG shows improvement in her glucose to 338 after administration of fluids and subcutaneous insulin.  She is not in DKA and has no elevated anion gap.  On reevaluation, patient is resting comfortably no apparent distress.  She states that she is feeling better.  She is neurovascularly intact and ambulatory without difficulty.  She was seen by case management who will help her reestablish follow-up with [facility] whom she has seen in the past prior to moving to [place].  Will discharge with refills of her insulin and lancets.  Discussed strict ED return precautions.  Patient and patient's daughter verbalized understanding of and agreement with plan and patient stable for discharge home at this time. Discussed with Dr. Darl Householder who agrees with assessment and plan at this time.  Final Clinical Impressions(s) / ED Diagnoses   Final diagnoses:  Shortness of breath  Atypical chest pain  Right leg pain  Hyperglycemia    ED Discharge Orders        Ordered    insulin aspart (NOVOLOG) 100 UNIT/ML injection  3 times daily before meals     09/19/17 1953    insulin glargine (LANTUS) 100 UNIT/ML injection  Daily at bedtime    Note to Pharmacy:  Must have office visit for refills   09/19/17 1953    Lancet Device MISC     09/19/17 1953  Renita Papa, PA-C 09/19/17 2019    Drenda Freeze, MD 09/19/17 [phone]

## 2017-09-19 NOTE — Discharge Instructions (Signed)
Continue taking all of your home medications as prescribed.  I have refilled your insulin and lancets.  Call [facility] tomorrow to set up follow-up appointment.  Return to the emergency department if any concerning signs or symptoms develop such as worsening chest pain or shortness of breath, coughing up blood, high fevers, or weakness.

## 2017-09-19 NOTE — ED Triage Notes (Signed)
Pt in c/o increased shortness of breath for the last few days, pt has a history of CHF, denies cough or fever, pt reports some pain when taking a deep breath

## 2017-09-19 NOTE — ED Notes (Signed)
Pt transported to vascular.  °

## 2017-09-28 ENCOUNTER — Ambulatory Visit: Payer: Medicare HMO | Attending: Family Medicine | Admitting: Physician Assistant

## 2017-09-28 VITALS — BP 137/80 | HR 82 | Temp 98.2°F | Resp 18 | Ht 59.0 in | Wt 165.0 lb

## 2017-09-28 DIAGNOSIS — Z794 Long term (current) use of insulin: Secondary | ICD-10-CM

## 2017-09-28 DIAGNOSIS — Z8673 Personal history of transient ischemic attack (TIA), and cerebral infarction without residual deficits: Secondary | ICD-10-CM | POA: Diagnosis not present

## 2017-09-28 DIAGNOSIS — I10 Essential (primary) hypertension: Secondary | ICD-10-CM

## 2017-09-28 DIAGNOSIS — Z888 Allergy status to other drugs, medicaments and biological substances status: Secondary | ICD-10-CM | POA: Insufficient documentation

## 2017-09-28 DIAGNOSIS — Z7982 Long term (current) use of aspirin: Secondary | ICD-10-CM | POA: Insufficient documentation

## 2017-09-28 DIAGNOSIS — Z9119 Patient's noncompliance with other medical treatment and regimen: Secondary | ICD-10-CM | POA: Diagnosis not present

## 2017-09-28 DIAGNOSIS — Z91040 Latex allergy status: Secondary | ICD-10-CM | POA: Insufficient documentation

## 2017-09-28 DIAGNOSIS — Z09 Encounter for follow-up examination after completed treatment for conditions other than malignant neoplasm: Secondary | ICD-10-CM

## 2017-09-28 DIAGNOSIS — E7800 Pure hypercholesterolemia, unspecified: Secondary | ICD-10-CM | POA: Insufficient documentation

## 2017-09-28 DIAGNOSIS — Z885 Allergy status to narcotic agent status: Secondary | ICD-10-CM | POA: Diagnosis not present

## 2017-09-28 DIAGNOSIS — Z79899 Other long term (current) drug therapy: Secondary | ICD-10-CM | POA: Insufficient documentation

## 2017-09-28 DIAGNOSIS — Z91018 Allergy to other foods: Secondary | ICD-10-CM | POA: Diagnosis not present

## 2017-09-28 DIAGNOSIS — I509 Heart failure, unspecified: Secondary | ICD-10-CM | POA: Insufficient documentation

## 2017-09-28 DIAGNOSIS — E1140 Type 2 diabetes mellitus with diabetic neuropathy, unspecified: Secondary | ICD-10-CM | POA: Diagnosis not present

## 2017-09-28 DIAGNOSIS — I110 Hypertensive heart disease with heart failure: Secondary | ICD-10-CM | POA: Diagnosis not present

## 2017-09-28 DIAGNOSIS — I252 Old myocardial infarction: Secondary | ICD-10-CM | POA: Diagnosis not present

## 2017-09-28 DIAGNOSIS — Z791 Long term (current) use of non-steroidal anti-inflammatories (NSAID): Secondary | ICD-10-CM | POA: Diagnosis not present

## 2017-09-28 DIAGNOSIS — I429 Cardiomyopathy, unspecified: Secondary | ICD-10-CM | POA: Diagnosis not present

## 2017-09-28 DIAGNOSIS — I5181 Takotsubo syndrome: Secondary | ICD-10-CM | POA: Diagnosis not present

## 2017-09-28 LAB — POCT GLYCOSYLATED HEMOGLOBIN (HGB A1C): HBA1C, POC (CONTROLLED DIABETIC RANGE): 13.3 % — AB (ref 0.0–7.0)

## 2017-09-28 LAB — GLUCOSE, POCT (MANUAL RESULT ENTRY): POC Glucose: 72 mg/dl (ref 70–99)

## 2017-09-28 MED ORDER — INSULIN ASPART 100 UNIT/ML ~~LOC~~ SOLN: 10.0000 [IU] | Freq: Three times a day (TID) | SUBCUTANEOUS | 1 refills | Status: DC

## 2017-09-28 MED ORDER — GABAPENTIN 300 MG PO CAPS: 300.0000 mg | ORAL_CAPSULE | Freq: Three times a day (TID) | ORAL | 3 refills | Status: DC

## 2017-09-28 MED ORDER — INSULIN GLARGINE 100 UNIT/ML ~~LOC~~ SOLN: 35.0000 [IU] | Freq: Every day | SUBCUTANEOUS | 0 refills | Status: DC

## 2017-09-28 MED ORDER — LOSARTAN POTASSIUM 50 MG PO TABS: 50.0000 mg | ORAL_TABLET | Freq: Every day | ORAL | 3 refills | Status: DC

## 2017-09-28 NOTE — Progress Notes (Signed)
Patient ID: Kara Hanson, female   DOB: 05-11-1955, 62 y.o.   MRN: [id number]      Kara Hanson, is a 62 y.o. female  TIR:[id number]  QPY:[id number]  DOB - 05-Feb-1956  Subjective:  Chief Complaint and HPI: Kara Hanson is a 62 y.o. female here today to re-establish care and for a follow up visit Seen in the ED for SOB on 09/19/2017.  She recently moved back here from [place] after her husband's death.  She says she was only out of her meds for about 1 week, but her A1C looks as though she was out longer.  Since resuming insulin, her blood sugars have been 100-low 200s.  Still having some SOB.  Had a stress test in [place] about 1-2 months ago that was normal.  Needs RF on losartan.  Plans on moving here now and not going back to [place].    From hospital/ED 09/19/2017 note: 62 y.o. female with history of CHF, CVA, diabetes mellitus, hypercholesterolemia, hypertension, and spinal stenosis presents for evaluation of gradual onset, per aggressively worsening shortness of breath for 2 days.  She notes significant dyspnea on exertion and states that she is barely able to move from one room to another room in her house without feeling very short of breath and needing to take a break.  She also endorses orthopnea and PND.  For the past 2 days she has also had dull pain in her anterior chest with inspiration only.  She denies any recent surgeries, hemoptysis, or prior history of DVT or PE and she does not take estrogen for placement therapy.  However, she did recently travel from [place] to [place] via train which was a 9-hour trip 1 week ago.  She states that she did attempt to walk around frequently while on the train.  She notes bilateral lower extremity edema which is chronic and unchanged.  She also has a chronic constant burning sensation radiating down the anterior aspect of the  right lower extremity.  She states that this is chronic secondary to her known spinal stenosis but states that this has  been worsening over the past few weeks.  Pain also worsens with palpation.  She has been taking her home medicines including gabapentin, carvedilol, Lasix.  She states that she has been out of all of her insulin for 1 to 2 weeks and her PCP in [place] has not refilled her medication.   From A/P: Patient presents for evaluation of 2 days of anterior chest pain and shortness of breath.  She is afebrile, initially hypertensive with improvement while in the ED.  She is in no apparent distress, nontoxic in appearance.  She did recently have a long train ride 1 week ago traveling from [place] to [place].  DVT study was negative for acute DVT, d-dimer is negative and she is low risk for PE per Wells' criteria.  Serial troponins are negative and EKG shows no significant changes from last tracing.  Doubt ACS or MI.  Chest x-ray shows mild chronic bronchitic changes and mild central pulmonary vascular prominence with borderline cardiomegaly which may reflect compensated CHF.  Her BNP is normal today.  She has mild epigastric tenderness to palpation but this appears to be chronic and her lipase is within normal limits.  She is tolerating p.o. food and fluids without difficulty.  I doubt acute intra-abdominal pathology.  Remainder of lab work reviewed by me shows no leukocytosis, no anemia, hyperglycemia with glucose 523.  She has  been out of her insulin for over 1 week, CBG shows improvement in her glucose to 338 after administration of fluids and subcutaneous insulin.  She is not in DKA and has no elevated anion gap.  On reevaluation, patient is resting comfortably no apparent distress.  She states that she is feeling better.  She is neurovascularly intact and ambulatory without difficulty.  She was seen by case management who will help her reestablish follow-up with [facility] wellness whom she has seen in the past prior to moving to [place].  Will discharge with refills of her insulin and lancets.   Discussed strict ED return precautions.  Patient and patient's daughter verbalized understanding of and agreement with plan and patient stable for discharge home at this time. Discussed with Dr. Darl Householder who agrees with assessment and plan at this time.   ED/Hospital notes reviewed.   Social History:  Recent death of husband; living here with daughter now   ROS:   Constitutional:  No f/c, No night sweats, No unexplained weight loss. EENT:  No vision changes, No blurry vision, No hearing changes. No mouth, throat, or ear problems.  Respiratory: No cough, +some SOB Cardiac: resolved CP, no palpitations GI:  No abd pain, No N/V/D. GU: No Urinary s/sx Musculoskeletal: No joint pain Neuro: No headache, no dizziness, no motor weakness.  Skin: No rash Endocrine:  No polydipsia. No polyuria.  Psych: Denies SI/HI  No problems updated.  ALLERGIES: Allergies  Allergen Reactions  . Garlic Shortness Of Breath, Itching and Swelling    Hand itching and swelling  . Latex Itching  . Morphine And Related Itching and Other (See Comments)    Headache   . Other Itching    Reaction to newspaper ink -itching and headache    PAST MEDICAL HISTORY: Past Medical History:  Diagnosis Date  . CHF (congestive heart failure) ([place])   . CVA (cerebral vascular accident) ([place])   . Diabetes mellitus without complication ([place])   . Hypercholesteremia   . Hypertension   . Myocardial infarction ([place])   . Spinal stenosis     MEDICATIONS AT HOME: Prior to Admission medications   Medication Sig Start Date End Date Taking? Authorizing Provider  allopurinol (ZYLOPRIM) 300 MG tablet Take 300 mg by mouth daily.   Yes [provider]  aspirin EC 81 MG tablet Take 81 mg by mouth daily.   Yes [provider]  carvedilol (COREG) 12.5 MG tablet Take 1 tablet (12.5 mg total) by mouth 2 (two) times daily with a meal. 05/19/16  Yes Jerline Pain, MD  furosemide (LASIX) 40 MG tablet Take 1 tablet (40 mg  total) by mouth daily. 08/26/16  Yes [name], MD  gabapentin (NEURONTIN) 300 MG capsule Take 1 capsule (300 mg total) by mouth 3 (three) times daily. 09/28/17  Yes Freeman Caldron M, PA-C  glucose blood (ACCU-CHEK AVIVA) test strip Use as instructed 3 times daily 05/06/16  Yes Newlin, Charlane Ferretti, MD  ibuprofen (ADVIL,MOTRIN) 200 MG tablet Take 200-600 mg by mouth every 6 (six) hours as needed for headache, mild pain or moderate pain.    Yes [provider]  insulin aspart (NOVOLOG) 100 UNIT/ML injection Inject 10 Units into the skin 3 (three) times daily before meals. 09/28/17  Yes Freeman Caldron M, PA-C  insulin glargine (LANTUS) 100 UNIT/ML injection Inject 0.35 mLs (35 Units total) into the skin at bedtime. 09/28/17  Yes Argentina Donovan, PA-C  Lancet Device MISC Use as instructed 3  times daily 09/19/17  Yes Fawze, Mina A, PA-C  lansoprazole (PREVACID) 15 MG capsule Take 15 mg by mouth daily.   Yes [provider]  losartan (COZAAR) 50 MG tablet Take 1 tablet (50 mg total) by mouth daily. 09/28/17  Yes McClung, Dionne Bucy, PA-C  meclizine (ANTIVERT) 25 MG tablet Take 1 tablet (25 mg total) by mouth 2 (two) times daily as needed. Patient taking differently: Take 25 mg by mouth 2 (two) times daily as needed (vertigo).  05/06/16  Yes [name], MD  Multiple Vitamin (MULTIVITAMIN WITH MINERALS) TABS tablet Take 1 tablet by mouth daily. Women's One a Day   Yes [provider]  pantoprazole (PROTONIX) 40 MG tablet Take 1 tablet (40 mg total) by mouth 2 (two) times daily. 10/01/16  Yes Molt, Bethany, DO  pravastatin (PRAVACHOL) 40 MG tablet Take 1 tablet (40 mg total) by mouth every evening. Patient taking differently: Take 40 mg by mouth at bedtime.  05/19/16  Yes Jerline Pain, MD  alum & mag hydroxide-simeth (MAALOX MAX) 539-767-34 MG/5ML suspension Take 10 mLs by mouth every 6 (six) hours as needed for indigestion (chest pain). Patient not taking: Reported on 09/19/2017  10/01/16   Molt, Bethany, DO     Objective:  EXAM:   Vitals:   09/28/17 1533  BP: 137/80  Pulse: 82  Resp: 18  Temp: 98.2 F (36.8 C)  TempSrc: Oral  SpO2: 97%  Weight: 165 lb (74.8 kg)  Height: 4\' 11"  (1.499 m)    General appearance : A&OX3. NAD. Non-toxic-appearing HEENT: Atraumatic and Normocephalic.  PERRLA. EOM intact.  Neck: supple, no JVD. No cervical lymphadenopathy. No thyromegaly Chest/Lungs:  Breathing-non-labored, Good air entry bilaterally, breath sounds normal without rales, rhonchi, or wheezing  CVS: S1 S2 regular, no murmurs, gallops, rubs  Extremities: Bilateral Lower Ext shows mild 1+ edema, both legs are warm to touch with = pulse throughout Neurology:  CN II-XII grossly intact, Non focal.   Psych:  TP linear. J/I WNL. Normal speech. Appropriate eye contact and affect.  Skin:  No Rash  Data Review Lab Results  Component Value Date   HGBA1C 13.3 (A) 09/28/2017   HGBA1C 13.8 (H) 10/01/2016   HGBA1C 12.2 05/06/2016     Assessment & Plan   1. Type 2 diabetes mellitus with diabetic neuropathy, with long-term current use of insulin ([place]) ?Non-compliance/was out of meds-she says she was only out for 1 week.  Has resumed regimen.  Check blood sugars at least fasting and at bedtime and record and bring to next visit.   - Glucose (CBG) - HgB A1c - gabapentin (NEURONTIN) 300 MG capsule; Take 1 capsule (300 mg total) by mouth 3 (three) times daily.  Dispense: 90 capsule; Refill: 3 - Ambulatory referral to Cardiology - insulin aspart (NOVOLOG) 100 UNIT/ML injection; Inject 10 Units into the skin 3 (three) times daily before meals.  Dispense: 10 mL; Refill: 1 - insulin glargine (LANTUS) 100 UNIT/ML injection; Inject 0.35 mLs (35 Units total) into the skin at bedtime.  Dispense: 10 mL; Refill: 0  2. Essential hypertension Was mid-cardiology work-up in [place] and had normal stress test by history - Ambulatory referral to Cardiology - losartan (COZAAR) 50 MG tablet;  Take 1 tablet (50 mg total) by mouth daily.  Dispense: 90 tablet; Refill: 3  3. Stress-induced cardiomyopathy - Ambulatory referral to Cardiology  4. Hospital discharge follow-up Improving; meds restarted   Patient have been counseled extensively about nutrition and exercise  Return in about  1 month (around 10/28/2017) for reestablish with Dr Margarita Rana.  The patient was given clear instructions to go to ER or return to medical center if symptoms don't improve, worsen or new problems develop. The patient verbalized understanding. The patient was told to call to get lab results if they haven't heard anything in the next week.     Freeman Caldron, PA-C [facility] and [facility] [place], [place]   09/28/2017, 3:53 PM

## 2017-09-28 NOTE — Patient Instructions (Signed)
Check blood sugar fasting and at bedtime and record and bring to next visit.

## 2017-11-01 ENCOUNTER — Telehealth: Payer: Self-pay | Admitting: Family Medicine

## 2017-11-01 DIAGNOSIS — Z794 Long term (current) use of insulin: Principal | ICD-10-CM

## 2017-11-01 DIAGNOSIS — E1140 Type 2 diabetes mellitus with diabetic neuropathy, unspecified: Secondary | ICD-10-CM

## 2017-11-01 MED ORDER — INSULIN GLARGINE 100 UNIT/ML ~~LOC~~ SOLN: 35.0000 [IU] | Freq: Every day | SUBCUTANEOUS | 0 refills | Status: DC

## 2017-11-01 NOTE — Telephone Encounter (Signed)
Patient called requesting a refill on insulin glargine (LANTUS) 100 UNIT/ML injection and would like for her Gabapentin to be transferred to [facility]. Please f/u

## 2017-11-02 ENCOUNTER — Other Ambulatory Visit: Payer: Self-pay | Admitting: Pharmacist

## 2017-11-02 ENCOUNTER — Telehealth: Payer: Self-pay

## 2017-11-02 MED ORDER — INSULIN DETEMIR 100 UNIT/ML ~~LOC~~ SOLN: 35.0000 [IU] | Freq: Every day | SUBCUTANEOUS | 11 refills | Status: DC

## 2017-11-02 NOTE — Telephone Encounter (Signed)
Pt ins prefers levemir or tresiba can we change and re-send to [facility]

## 2017-11-08 ENCOUNTER — Other Ambulatory Visit: Payer: Self-pay | Admitting: Family Medicine

## 2017-11-08 DIAGNOSIS — E1140 Type 2 diabetes mellitus with diabetic neuropathy, unspecified: Secondary | ICD-10-CM

## 2017-11-08 DIAGNOSIS — Z794 Long term (current) use of insulin: Principal | ICD-10-CM

## 2017-11-08 NOTE — Telephone Encounter (Signed)
I need to know exactly which meds she needs and which pharmacy they should be sent to if the provider approves the refills before her appt. Thanks!

## 2017-11-08 NOTE — Telephone Encounter (Signed)
Patient called asking for medication refills for all of her medications, states that she is completely out of Insulin and can't wait until her appointment next week. States she has called trying to speak with the doctor. Please follow up with patient.

## 2017-11-14 ENCOUNTER — Ambulatory Visit: Payer: Medicare HMO | Attending: Family Medicine | Admitting: Family Medicine

## 2017-11-14 ENCOUNTER — Encounter: Payer: Self-pay | Admitting: Family Medicine

## 2017-11-14 VITALS — BP 145/78 | HR 77 | Temp 98.5°F | Ht 59.0 in | Wt 170.6 lb

## 2017-11-14 DIAGNOSIS — I5022 Chronic systolic (congestive) heart failure: Secondary | ICD-10-CM | POA: Diagnosis not present

## 2017-11-14 DIAGNOSIS — E119 Type 2 diabetes mellitus without complications: Secondary | ICD-10-CM | POA: Diagnosis present

## 2017-11-14 DIAGNOSIS — I252 Old myocardial infarction: Secondary | ICD-10-CM | POA: Insufficient documentation

## 2017-11-14 DIAGNOSIS — M4800 Spinal stenosis, site unspecified: Secondary | ICD-10-CM | POA: Diagnosis not present

## 2017-11-14 DIAGNOSIS — Z91040 Latex allergy status: Secondary | ICD-10-CM | POA: Diagnosis not present

## 2017-11-14 DIAGNOSIS — M1A00X0 Idiopathic chronic gout, unspecified site, without tophus (tophi): Secondary | ICD-10-CM | POA: Diagnosis not present

## 2017-11-14 DIAGNOSIS — Z8673 Personal history of transient ischemic attack (TIA), and cerebral infarction without residual deficits: Secondary | ICD-10-CM | POA: Insufficient documentation

## 2017-11-14 DIAGNOSIS — Z79899 Other long term (current) drug therapy: Secondary | ICD-10-CM | POA: Diagnosis not present

## 2017-11-14 DIAGNOSIS — E1165 Type 2 diabetes mellitus with hyperglycemia: Secondary | ICD-10-CM | POA: Diagnosis not present

## 2017-11-14 DIAGNOSIS — M51369 Other intervertebral disc degeneration, lumbar region without mention of lumbar back pain or lower extremity pain: Secondary | ICD-10-CM | POA: Insufficient documentation

## 2017-11-14 DIAGNOSIS — M5126 Other intervertebral disc displacement, lumbar region: Secondary | ICD-10-CM

## 2017-11-14 DIAGNOSIS — Z885 Allergy status to narcotic agent status: Secondary | ICD-10-CM | POA: Insufficient documentation

## 2017-11-14 DIAGNOSIS — E1140 Type 2 diabetes mellitus with diabetic neuropathy, unspecified: Secondary | ICD-10-CM | POA: Insufficient documentation

## 2017-11-14 DIAGNOSIS — G8929 Other chronic pain: Secondary | ICD-10-CM | POA: Insufficient documentation

## 2017-11-14 DIAGNOSIS — Z794 Long term (current) use of insulin: Secondary | ICD-10-CM | POA: Diagnosis not present

## 2017-11-14 DIAGNOSIS — Z7982 Long term (current) use of aspirin: Secondary | ICD-10-CM | POA: Diagnosis not present

## 2017-11-14 DIAGNOSIS — I10 Essential (primary) hypertension: Secondary | ICD-10-CM | POA: Diagnosis not present

## 2017-11-14 DIAGNOSIS — I110 Hypertensive heart disease with heart failure: Secondary | ICD-10-CM | POA: Diagnosis not present

## 2017-11-14 DIAGNOSIS — Z9049 Acquired absence of other specified parts of digestive tract: Secondary | ICD-10-CM | POA: Diagnosis not present

## 2017-11-14 DIAGNOSIS — M109 Gout, unspecified: Secondary | ICD-10-CM

## 2017-11-14 DIAGNOSIS — E7800 Pure hypercholesterolemia, unspecified: Secondary | ICD-10-CM | POA: Insufficient documentation

## 2017-11-14 DIAGNOSIS — M5136 Other intervertebral disc degeneration, lumbar region: Secondary | ICD-10-CM

## 2017-11-14 HISTORY — DX: Gout, unspecified: M10.9

## 2017-11-14 HISTORY — DX: Other intervertebral disc degeneration, lumbar region without mention of lumbar back pain or lower extremity pain: M51.369

## 2017-11-14 HISTORY — DX: Other intervertebral disc degeneration, lumbar region: M51.36

## 2017-11-14 HISTORY — DX: Other intervertebral disc displacement, lumbar region: M51.26

## 2017-11-14 LAB — GLUCOSE, POCT (MANUAL RESULT ENTRY): POC Glucose: 130 mg/dl — AB (ref 70–99)

## 2017-11-14 MED ORDER — TIZANIDINE HCL 4 MG PO TABS: 4.0000 mg | ORAL_TABLET | Freq: Three times a day (TID) | ORAL | 2 refills | Status: DC | PRN

## 2017-11-14 MED ORDER — FUROSEMIDE 40 MG PO TABS: 40.0000 mg | ORAL_TABLET | Freq: Every day | ORAL | 1 refills | Status: DC

## 2017-11-14 MED ORDER — INSULIN ASPART 100 UNIT/ML ~~LOC~~ SOLN: SUBCUTANEOUS | 1 refills | Status: DC

## 2017-11-14 MED ORDER — GABAPENTIN 300 MG PO CAPS: 600.0000 mg | ORAL_CAPSULE | Freq: Two times a day (BID) | ORAL | 1 refills | Status: DC

## 2017-11-14 MED ORDER — CARVEDILOL 12.5 MG PO TABS: 12.5000 mg | ORAL_TABLET | Freq: Two times a day (BID) | ORAL | 1 refills | Status: DC

## 2017-11-14 MED ORDER — ALLOPURINOL 300 MG PO TABS: 300.0000 mg | ORAL_TABLET | Freq: Every day | ORAL | 1 refills | Status: DC

## 2017-11-14 MED ORDER — ATORVASTATIN CALCIUM 20 MG PO TABS: 20.0000 mg | ORAL_TABLET | Freq: Every day | ORAL | 1 refills | Status: DC

## 2017-11-14 MED ORDER — METOCLOPRAMIDE HCL 5 MG PO TABS: 5.0000 mg | ORAL_TABLET | Freq: Three times a day (TID) | ORAL | 1 refills | Status: DC

## 2017-11-14 MED ORDER — INSULIN GLARGINE 100 UNIT/ML SOLOSTAR PEN: 35.0000 [IU] | PEN_INJECTOR | Freq: Every day | SUBCUTANEOUS | 3 refills | Status: DC

## 2017-11-14 NOTE — Progress Notes (Signed)
Subjective:  Patient ID: Kara Hanson, female    DOB: 1956/01/24  Age: 62 y.o. MRN: [id number]  CC: Diabetes   HPI Kara Hanson  is a 62 year old female with a history of type 2 diabetes mellitus (A1c 13.3), hypertension, congestive heart failure (EF 55-60% from echo of 06/2016) who presents today for a follow-up visit. She relocated back to [place] from [place] 2 months ago and has been out of her Lantus for the last 1 week.  While she was compliant with Lantus and NovoLog 10 units 3 times daily she had noticed her sugars dropping.  She does have diabetic neuropathy with numb sensations in both lower extremities.  She also complains of early satiety with associated bloating and reduced appetite. She has chronic low back pain from degenerative disc disease and associated spinal stenosis status post epidural spinal injection in [place].  Her symptoms include bilateral leg weakness and burning which prevents her from going up stairs and she has to have her daughter help her even when climbing up the curb.  Denies recent falls. She had an ED visit in 09/2017 for dyspnea due to running out of Lasix but informs me today she has no chest pains and is not dyspneic.  Past Medical History:  Diagnosis Date  . CHF (congestive heart failure) ([facility])   . CVA (cerebral vascular accident) ([place])   . Diabetes mellitus without complication ([facility])   . Hypercholesteremia   . Hypertension   . Myocardial infarction ([place])   . Spinal stenosis     Past Surgical History:  Procedure Laterality Date  . BLADDER SURGERY    . CARDIAC CATHETERIZATION N/A 04/25/2016   Procedure: Left Heart Cath and Coronary Angiography;  Surgeon: Lorretta Harp, MD;  Location: [facility];  Service: Cardiovascular;  Laterality: N/A;  . CESAREAN SECTION    . CHOLECYSTECTOMY      Allergies  Allergen Reactions  . Garlic Shortness Of Breath, Itching and Swelling    Hand itching and swelling  . Latex Itching  .  Morphine And Related Itching and Other (See Comments)    Headache   . Other Itching    Reaction to newspaper ink -itching and headache     Outpatient Medications Prior to Visit  Medication Sig Dispense Refill  . aspirin EC 81 MG tablet Take 81 mg by mouth daily.    Marland Kitchen glucose blood (ACCU-CHEK AVIVA) test strip Use as instructed 3 times daily 100 each 12  . ibuprofen (ADVIL,MOTRIN) 200 MG tablet Take 200-600 mg by mouth every 6 (six) hours as needed for headache, mild pain or moderate pain.     Marland Kitchen insulin aspart (NOVOLOG) 100 UNIT/ML injection Inject 10 Units into the skin 3 (three) times daily before meals. 10 mL 1  . Lancet Device MISC Use as instructed 3 times daily 1 each 0  . lansoprazole (PREVACID) 15 MG capsule Take 15 mg by mouth daily.    Marland Kitchen losartan (COZAAR) 50 MG tablet Take 1 tablet (50 mg total) by mouth daily. 90 tablet 3  . meclizine (ANTIVERT) 25 MG tablet Take 1 tablet (25 mg total) by mouth 2 (two) times daily as needed. (Patient taking differently: Take 25 mg by mouth 2 (two) times daily as needed (vertigo). ) 60 tablet 1  . Multiple Vitamin (MULTIVITAMIN WITH MINERALS) TABS tablet Take 1 tablet by mouth daily. Women's One a Day    . pantoprazole (PROTONIX) 40 MG tablet Take 1 tablet (40 mg total)  by mouth 2 (two) times daily. 60 tablet 1  . allopurinol (ZYLOPRIM) 300 MG tablet Take 300 mg by mouth daily.    . carvedilol (COREG) 12.5 MG tablet Take 1 tablet (12.5 mg total) by mouth 2 (two) times daily with a meal. 180 tablet 3  . furosemide (LASIX) 40 MG tablet Take 1 tablet (40 mg total) by mouth daily. 30 tablet 2  . gabapentin (NEURONTIN) 300 MG capsule Take 1 capsule (300 mg total) by mouth 3 (three) times daily. 90 capsule 3  . pravastatin (PRAVACHOL) 40 MG tablet Take 1 tablet (40 mg total) by mouth every evening. (Patient taking differently: Take 40 mg by mouth at bedtime. ) 90 tablet 3  . alum & mag hydroxide-simeth (MAALOX MAX) 400-400-40 MG/5ML suspension Take 10 mLs  by mouth every 6 (six) hours as needed for indigestion (chest pain). (Patient not taking: Reported on 09/19/2017) 355 mL 0  . insulin detemir (LEVEMIR) 100 UNIT/ML injection Inject 0.35 mLs (35 Units total) into the skin at bedtime. (Patient not taking: Reported on 11/14/2017) 10 mL 11   No facility-administered medications prior to visit.     ROS Review of Systems  Constitutional: Negative for activity change, appetite change and fatigue.  HENT: Negative for congestion, sinus pressure and sore throat.   Eyes: Negative for visual disturbance.  Respiratory: Negative for cough, chest tightness, shortness of breath and wheezing.   Cardiovascular: Negative for chest pain and palpitations.  Gastrointestinal: Negative for abdominal distention, abdominal pain and constipation.  Endocrine: Negative for polydipsia.  Genitourinary: Negative for dysuria and frequency.  Musculoskeletal:       See hpi  Skin: Negative for rash.  Neurological: Negative for tremors, light-headedness and numbness.  Hematological: Does not bruise/bleed easily.  Psychiatric/Behavioral: Negative for agitation and behavioral problems.    Objective:  BP (!) 145/78   Pulse 77   Temp 98.5 F (36.9 C) (Oral)   Ht 4\' 11"  (1.499 m)   Wt 170 lb 9.6 oz (77.4 kg)   SpO2 97%   BMI 34.46 kg/m   BP/Weight 11/14/2017 09/28/2017 8/41/3244  Systolic BP 010 272 536  Diastolic BP 78 80 53  Wt. (Lbs) 170.6 165 -  BMI 34.46 33.33 -      Physical Exam  Constitutional: She is oriented to person, place, and time. She appears well-developed and well-nourished.  Cardiovascular: Normal rate, normal heart sounds and intact distal pulses.  No murmur heard. Pulmonary/Chest: Effort normal and breath sounds normal. She has no wheezes. She has no rales. She exhibits no tenderness.  Abdominal: Soft. Bowel sounds are normal. She exhibits no distension and no mass. There is no tenderness.  Musculoskeletal: Normal range of motion.    Neurological: She is alert and oriented to person, place, and time.  Reduced muscle strength in both lower extremities  Skin: Skin is warm and dry.  Psychiatric: She has a normal mood and affect.     CMP Latest Ref Rng & Units 09/19/2017 10/01/2016 09/30/2016  Glucose 65 - 99 mg/dL 523(HH) 235(H) 523(HH)  BUN 6 - 20 mg/dL 36(H) 21(H) 33(H)  Creatinine 0.44 - 1.00 mg/dL 1.16(H) 0.84 1.10(H)  Sodium 135 - 145 mmol/L 131(L) 135 131(L)  Potassium 3.5 - 5.1 mmol/L 4.4 3.5 3.8  Chloride 101 - 111 mmol/L 98(L) 104 98(L)  CO2 22 - 32 mmol/L 24 25 -  Calcium 8.9 - 10.3 mg/dL 9.9 8.7(L) -  Total Protein 6.5 - 8.1 g/dL 6.5 - -  Total Bilirubin 0.3 -  1.2 mg/dL 0.4 - -  Alkaline Phos 38 - 126 U/L 76 - -  AST 15 - 41 U/L 14(L) - -  ALT 14 - 54 U/L 18 - -      Lab Results  Component Value Date   HGBA1C 13.3 (A) 09/28/2017    Assessment & Plan:   1. Type 2 diabetes mellitus with diabetic neuropathy, with long-term current use of insulin (H[facility]) Uncontrolled with A1c of 13.3 Switch from fixed NovoLog regimen to sliding scale due to complaints of hypoglycemia while on fixed NovoLog regimen and Lantus She will see the clinical pharmacist at which time Lantus dose can be uptitrated based on blood sugar readings Commenced on Reglan for diabetic gastroparesis Counseled on Diabetic diet, my plate method, 009 minutes of moderate intensity exercise/week Keep blood sugar logs with fasting goals of 80-120 mg/dl, random of less than 180 and in the event of sugars less than 60 mg/dl or greater than 400 mg/dl please notify the clinic ASAP. It is recommended that you undergo annual eye exams and annual foot exams. Pneumonia vaccine is recommended. - POCT glucose (manual entry) - Insulin Glargine (LANTUS SOLOSTAR) 100 UNIT/ML Solostar Pen; Inject 35 Units into the skin daily.  Dispense: 5 pen; Refill: 3 - gabapentin (NEURONTIN) 300 MG capsule; Take 2 capsules (600 mg total) by mouth 2 (two) times daily.   Dispense: 360 capsule; Refill: 1 - Microalbumin/Creatinine Ratio, Urine; Future - Uric Acid; Future - Lipid panel; Future - atorvastatin (LIPITOR) 20 MG tablet; Take 1 tablet (20 mg total) by mouth daily.  Dispense: 90 tablet; Refill: 1  2. Essential hypertension Likely elevated No regimen change today Counseled on blood pressure goal of less than 130/80, low-sodium, DASH diet, medication compliance, 150 minutes of moderate intensity exercise per week. Discussed medication compliance, adverse effects. - carvedilol (COREG) 12.5 MG tablet; Take 1 tablet (12.5 mg total) by mouth 2 (two) times daily with a meal.  Dispense: 180 tablet; Refill: 1  3. Chronic systolic congestive heart failure (HCC) EF 55 to 60% from 06/2016 Euvolemic Continue beta-blocker, ARB, Lasix - furosemide (LASIX) 40 MG tablet; Take 1 tablet (40 mg total) by mouth daily.  Dispense: 90 tablet; Refill: 1  4. Bulging lumbar disc Uncontrolled If symptoms persist despite increasing dose of gabapentin in addition of tizanidine, I will refer her to a spine specialist for possible epidural spinal injections - tiZANidine (ZANAFLEX) 4 MG tablet; Take 1 tablet (4 mg total) by mouth every 8 (eight) hours as needed for muscle spasms.  Dispense: 90 tablet; Refill: 2  5. Idiopathic chronic gout without tophus, unspecified site No acute flares - allopurinol (ZYLOPRIM) 300 MG tablet; Take 1 tablet (300 mg total) by mouth daily.  Dispense: 90 tablet; Refill: 1   Meds ordered this encounter  Medications  . Insulin Glargine (LANTUS SOLOSTAR) 100 UNIT/ML Solostar Pen    Sig: Inject 35 Units into the skin daily.    Dispense:  5 pen    Refill:  3    Discontinue Levemir  . tiZANidine (ZANAFLEX) 4 MG tablet    Sig: Take 1 tablet (4 mg total) by mouth every 8 (eight) hours as needed for muscle spasms.    Dispense:  90 tablet    Refill:  2  . gabapentin (NEURONTIN) 300 MG capsule    Sig: Take 2 capsules (600 mg total) by mouth 2 (two)  times daily.    Dispense:  360 capsule    Refill:  1  . allopurinol (ZYLOPRIM)  300 MG tablet    Sig: Take 1 tablet (300 mg total) by mouth daily.    Dispense:  90 tablet    Refill:  1  . atorvastatin (LIPITOR) 20 MG tablet    Sig: Take 1 tablet (20 mg total) by mouth daily.    Dispense:  90 tablet    Refill:  1  . furosemide (LASIX) 40 MG tablet    Sig: Take 1 tablet (40 mg total) by mouth daily.    Dispense:  90 tablet    Refill:  1  . carvedilol (COREG) 12.5 MG tablet    Sig: Take 1 tablet (12.5 mg total) by mouth 2 (two) times daily with a meal.    Dispense:  180 tablet    Refill:  1    Follow-up: Return in about 2 weeks (around 11/28/2017) for Follow-up of blood sugars with Lurena Joiner; 3 months with PCP.   [name] MD

## 2017-11-14 NOTE — Patient Instructions (Signed)

## 2017-11-15 ENCOUNTER — Other Ambulatory Visit: Payer: Medicare HMO

## 2017-11-20 MED ORDER — ONETOUCH DELICA LANCETS 33G MISC: 2 refills | Status: DC

## 2017-11-20 MED ORDER — ONETOUCH ULTRA MINI W/DEVICE KIT: PACK | 0 refills | Status: DC

## 2017-11-20 MED ORDER — GLUCOSE BLOOD VI STRP: ORAL_STRIP | 2 refills | Status: DC

## 2017-11-20 NOTE — Telephone Encounter (Signed)
Patient called for furosemide (LASIX) 40 gabapentin (NEURONTIN)

## 2017-11-20 NOTE — Telephone Encounter (Signed)
Gave pharmacy a verbal order matching the ones on file for furosemide and losartan because they were not received by the pharmacy when they were originally sent. Also sent meter/strips/lancets at patients request, they hadn't been testing regularly since meter broke quite some time ago. Notified pt's daughter only thing left to resolve is switching her lantus to basaglar for insurance purposes

## 2017-11-21 ENCOUNTER — Telehealth: Payer: Self-pay | Admitting: Family Medicine

## 2017-11-21 MED ORDER — BASAGLAR KWIKPEN 100 UNIT/ML ~~LOC~~ SOPN: PEN_INJECTOR | SUBCUTANEOUS | 3 refills | Status: DC

## 2017-11-21 NOTE — Telephone Encounter (Signed)
Patient Called requesting a modification in medication for gabapentin 600 mg twice a day because she feels it is too much for her, patient did not mention any side effects. Patient also mentioned that the insulin and gabapentin needed provider prior authorization.

## 2017-11-21 NOTE — Telephone Encounter (Signed)
Gabapentin has been approved and insulin has been changed to proffered insulin for insurance.

## 2017-11-22 ENCOUNTER — Emergency Department (HOSPITAL_COMMUNITY): Payer: Medicare HMO

## 2017-11-22 ENCOUNTER — Emergency Department (HOSPITAL_COMMUNITY)
Admission: EM | Admit: 2017-11-22 | Discharge: 2017-11-23 | Disposition: A | Discharge status: Home / Self Care | Payer: Medicare HMO | Attending: Emergency Medicine | Admitting: Emergency Medicine

## 2017-11-22 ENCOUNTER — Other Ambulatory Visit: Payer: Self-pay

## 2017-11-22 DIAGNOSIS — Z7982 Long term (current) use of aspirin: Secondary | ICD-10-CM | POA: Insufficient documentation

## 2017-11-22 DIAGNOSIS — R070 Pain in throat: Secondary | ICD-10-CM | POA: Insufficient documentation

## 2017-11-22 DIAGNOSIS — Z91040 Latex allergy status: Secondary | ICD-10-CM | POA: Diagnosis not present

## 2017-11-22 DIAGNOSIS — E119 Type 2 diabetes mellitus without complications: Secondary | ICD-10-CM | POA: Diagnosis not present

## 2017-11-22 DIAGNOSIS — R0789 Other chest pain: Secondary | ICD-10-CM | POA: Insufficient documentation

## 2017-11-22 DIAGNOSIS — Z79899 Other long term (current) drug therapy: Secondary | ICD-10-CM | POA: Diagnosis not present

## 2017-11-22 DIAGNOSIS — I110 Hypertensive heart disease with heart failure: Secondary | ICD-10-CM | POA: Insufficient documentation

## 2017-11-22 DIAGNOSIS — I509 Heart failure, unspecified: Secondary | ICD-10-CM

## 2017-11-22 DIAGNOSIS — Z794 Long term (current) use of insulin: Secondary | ICD-10-CM | POA: Diagnosis not present

## 2017-11-22 DIAGNOSIS — R0602 Shortness of breath: Secondary | ICD-10-CM

## 2017-11-22 LAB — CBC WITH DIFFERENTIAL/PLATELET
ABS IMMATURE GRANULOCYTES: 0 10*3/uL (ref 0.0–0.1)
BASOS ABS: 0 10*3/uL (ref 0.0–0.1)
BASOS PCT: 1 %
Eosinophils Absolute: 0.1 10*3/uL (ref 0.0–0.7)
Eosinophils Relative: 2 %
HCT: 34.9 % — ABNORMAL LOW (ref 36.0–46.0)
Hemoglobin: 11.4 g/dL — ABNORMAL LOW (ref 12.0–15.0)
Immature Granulocytes: 0 %
Lymphocytes Relative: 17 %
Lymphs Abs: 1.1 10*3/uL (ref 0.7–4.0)
MCH: 28.5 pg (ref 26.0–34.0)
MCHC: 32.7 g/dL (ref 30.0–36.0)
MCV: 87.3 fL (ref 78.0–100.0)
MONO ABS: 0.5 10*3/uL (ref 0.1–1.0)
Monocytes Relative: 7 %
NEUTROS ABS: 4.7 10*3/uL (ref 1.7–7.7)
Neutrophils Relative %: 73 %
PLATELETS: 251 10*3/uL (ref 150–400)
RBC: 4 MIL/uL (ref 3.87–5.11)
RDW: 13.4 % (ref 11.5–15.5)
WBC: 6.4 10*3/uL (ref 4.0–10.5)

## 2017-11-22 MED ORDER — ALBUTEROL SULFATE (2.5 MG/3ML) 0.083% IN NEBU
5.0000 mg | INHALATION_SOLUTION | Freq: Once | RESPIRATORY_TRACT | Status: AC
  Administered 2017-11-22: 5 mg via RESPIRATORY_TRACT
  Filled 2017-11-22: qty 6

## 2017-11-22 NOTE — ED Provider Notes (Signed)
[facility] EMERGENCY DEPARTMENT Provider Note   CSN: [id number] Arrival date & time: 11/22/17  2231     History   Chief Complaint Chief Complaint  Patient presents with  . Shortness of Breath    HPI Kara Hanson is a 62 y.o. female.  Patient with history of CHF, T2DM, HTN, HLD, CVA, spinal stenosis, MI presents with SOB that woke her from sleep last night. Since then she has been unable to lie flat due to SOB. She reports worsening of her usual LE edema (bilateral). Seen in the ED on 09/19/17 and discharged home with follow up arranged with [facility]. She was able to see Dr. Newlin on 11/14/17 for that follow up and all routine issues were addressed and medications provided by prescription. Tonight, the patient reports that the pharmacy would not fill all of her prescriptions and she is still out of her Lasix (40 mg QD), gabapentin (600 mg BID), allopurinol and Losartan (50 mg QD). She is working with her physician to correct her medication deficits. No chest pain, cough or fever tonight.   The history is provided by the patient. No language interpreter was used.    Past Medical History:  Diagnosis Date  . CHF (congestive heart failure) (HCC)   . CVA (cerebral vascular accident) (HCC)   . Diabetes mellitus without complication (HCC)   . Hypercholesteremia   . Hypertension   . Myocardial infarction (HCC)   . Spinal stenosis     Patient Active Problem List   Diagnosis Date Noted  . Bulging lumbar disc 11/14/2017  . Gout 11/14/2017  . HLD (hyperlipidemia) 09/30/2016  . Chest pain 06/17/2016  . Stress-induced cardiomyopathy 05/19/2016  . Type 2 diabetes mellitus with diabetic neuropathy, unspecified (HCC) 05/06/2016  . Vertigo 05/06/2016  . Controlled type 2 diabetes mellitus with hyperglycemia (HCC) 04/23/2016  . Hypertension 04/23/2016  . Normochromic normocytic anemia 04/23/2016  . Syncope 04/23/2016    Past Surgical History:    Procedure Laterality Date  . BLADDER SURGERY    . CARDIAC CATHETERIZATION N/A 04/25/2016   Procedure: Left Heart Cath and Coronary Angiography;  Surgeon: Jonathan J Berry, MD;  Location: [facility] INVASIVE CV LAB;  Service: Cardiovascular;  Laterality: N/A;  . CESAREAN SECTION    . CHOLECYSTECTOMY       OB History   None      Home Medications    Prior to Admission medications   Medication Sig Start Date End Date Taking? Authorizing Provider  allopurinol (ZYLOPRIM) 300 MG tablet Take 1 tablet (300 mg total) by mouth daily. 11/14/17   Newlin, Enobong, MD  alum & mag hydroxide-simeth (MAALOX MAX) 400-400-40 MG/5ML suspension Take 10 mLs by mouth every 6 (six) hours as needed for indigestion (chest pain). Patient not taking: Reported on 09/19/2017 10/01/16   Molt, Bethany, DO  aspirin EC 81 MG tablet Take 81 mg by mouth daily.    [provider]  atorvastatin (LIPITOR) 20 MG tablet Take 1 tablet (20 mg total) by mouth daily. 11/14/17   Newlin, Enobong, MD  Blood Glucose Monitoring Suppl (ONE TOUCH ULTRA MINI) w/Device KIT UAD UP TO THREE TIMES DAILY DX E11.4 11/20/17   Newlin, Enobong, MD  carvedilol (COREG) 12.5 MG tablet Take 1 tablet (12.5 mg total) by mouth 2 (two) times daily with a meal. 11/14/17   Newlin, Enobong, MD  furosemide (LASIX) 40 MG tablet Take 1 tablet (40 mg total) by mouth daily. 11/14/17   Newlin, Enobong, MD  gabapentin (  NEURONTIN) 300 MG capsule Take 2 capsules (600 mg total) by mouth 2 (two) times daily. 11/14/17   Newlin, Enobong, MD  glucose blood (ONE TOUCH ULTRA TEST) test strip UAD UP TO THREE TIMES DAILY DX E11.4 11/20/17   Newlin, Enobong, MD  ibuprofen (ADVIL,MOTRIN) 200 MG tablet Take 200-600 mg by mouth every 6 (six) hours as needed for headache, mild pain or moderate pain.     [provider]  insulin aspart (NOVOLOG) 100 UNIT/ML injection 0 to 12 units subcutaneously 3 times daily before meals as per sliding scale 11/14/17   Newlin, Enobong, MD  Insulin  Glargine (BASAGLAR KWIKPEN) 100 UNIT/ML SOPN Inject 35 units into the skin daily. 11/21/17   Newlin, Enobong, MD  Lancet Device MISC Use as instructed 3 times daily 09/19/17   Fawze, Mina A, PA-C  lansoprazole (PREVACID) 15 MG capsule Take 15 mg by mouth daily.    [provider]  losartan (COZAAR) 50 MG tablet Take 1 tablet (50 mg total) by mouth daily. 09/28/17   McClung, Angela M, PA-C  meclizine (ANTIVERT) 25 MG tablet Take 1 tablet (25 mg total) by mouth 2 (two) times daily as needed. Patient taking differently: Take 25 mg by mouth 2 (two) times daily as needed (vertigo).  05/06/16   Newlin, Enobong, MD  metoCLOPramide (REGLAN) 5 MG tablet Take 1 tablet (5 mg total) by mouth 3 (three) times daily before meals. 11/14/17   Newlin, Enobong, MD  Multiple Vitamin (MULTIVITAMIN WITH MINERALS) TABS tablet Take 1 tablet by mouth daily. Women's One a Day    [provider]  ONETOUCH DELICA LANCETS 33G MISC UAD UP TO THREE TIMES DAILY DX E11.4 11/20/17   Newlin, Enobong, MD  pantoprazole (PROTONIX) 40 MG tablet Take 1 tablet (40 mg total) by mouth 2 (two) times daily. 10/01/16   Molt, Bethany, DO  tiZANidine (ZANAFLEX) 4 MG tablet Take 1 tablet (4 mg total) by mouth every 8 (eight) hours as needed for muscle spasms. 11/14/17   Newlin, Enobong, MD    Family History Family History  Problem Relation Age of Onset  . Diabetes Mellitus II Father   . Stroke Father     Social History Social History   Tobacco Use  . Smoking status: Never Smoker  . Smokeless tobacco: Never Used  Substance Use Topics  . Alcohol use: No  . Drug use: No     Allergies   Garlic; Latex; Morphine and related; and Other   Review of Systems Review of Systems  Constitutional: Negative for chills and fever.  HENT: Negative.   Respiratory: Positive for shortness of breath.        DOE, orthopnea  Cardiovascular: Positive for leg swelling. Negative for chest pain.  Gastrointestinal: Negative.  Negative for  abdominal pain, nausea and vomiting.  Musculoskeletal: Negative.   Skin: Negative.   Neurological: Negative.      Physical Exam Updated Vital Signs BP (!) 190/86 (BP Location: Right Arm)   Pulse 76   Temp 98.1 F (36.7 C) (Oral)   Resp (!) 22   SpO2 98%   Physical Exam  Constitutional: She appears well-developed and well-nourished. No distress.  HENT:  Head: Normocephalic.  Neck: Normal range of motion. Neck supple.  Cardiovascular: Normal rate and regular rhythm.  Pulmonary/Chest: Effort normal. She has no wheezes. She has no rhonchi. She has rales (Scattered). She exhibits no tenderness.  Abdominal: Soft. Bowel sounds are normal. There is no tenderness. There is no rebound and no guarding.    Musculoskeletal: Normal range of motion.       Right lower leg: She exhibits edema.       Left lower leg: She exhibits edema.  LE edema is minimal.  Neurological: She is alert. No cranial nerve deficit.  Skin: Skin is warm and dry. No rash noted.  Psychiatric: She has a normal mood and affect.  Nursing note and vitals reviewed.    ED Treatments / Results  Labs (all labs ordered are listed, but only abnormal results are displayed) Labs Reviewed  BRAIN NATRIURETIC PEPTIDE  CBC WITH DIFFERENTIAL/PLATELET  TROPONIN I  COMPREHENSIVE METABOLIC PANEL    EKG EKG Interpretation  Date/Time:  Wednesday November 22 2017 23:00:29 EDT Ventricular Rate:  81 PR Interval:  168 QRS Duration: 84 QT Interval:  360 QTC Calculation: 418 R Axis:   78 Text Interpretation:  Normal sinus rhythm T wave abnormality, consider inferolateral ischemia Abnormal ECG When compared with ECG of 09/19/2017, No significant change was found Confirmed by Delora Fuel ([id number]) on 11/22/2017 11:14:29 PM   Radiology Dg Chest 2 View  Result Date: 11/22/2017 CLINICAL DATA:  Short of breath EXAM: CHEST - 2 VIEW COMPARISON:  09/19/2017, 09/30/2016, CT chest 09/30/2016 FINDINGS: Small pleural effusions. Mild cardiomegaly  with vascular congestion. Diffuse increased interstitial opacity most likely representing pulmonary edema. Patchy airspace disease at the right base. IMPRESSION: 1. Mild cardiomegaly with vascular congestion, small pleural effusion and mild interstitial edema 2. Patchy airspace disease at the right base may reflect atelectasis or pneumonia Electronically Signed   By: Donavan Foil M.D.   On: 11/22/2017 23:14    Procedures Procedures (including critical care time)  Medications Ordered in ED Medications  albuterol (PROVENTIL) (2.5 MG/3ML) 0.083% nebulizer solution 5 mg (5 mg Nebulization Given 11/22/17 2335)     Initial Impression / Assessment and Plan / ED Course  I have reviewed the triage vital signs and the nursing notes.  Pertinent labs & imaging results that were available during my care of the patient were reviewed by me and considered in my medical decision making (see chart for details).     Patient presents with SOB, orthopnea, DOE that started last night. She reports SOB woke her from sleep and has persisted. No chest pain.   Labs, imaging are pending. VSS. Will provide dosing of Lasix and Losartan. Will review test results but suspect mild CHF exacerbation with orthopnea and LE edema. Of note, on recent visit to ED for similar symptoms the patient had negative clot studies including LE Doppler and CTA chest.   IV Lasix provided x 2 doses. The patient has been ambulatory without desaturation, SOB improving. VSS.   She is boarded in the ED as family cannot pick her up until morning. On last recheck, the patient was sleeping comfortably.   Plan discharge home. She will follow up with her doctor to insure regular medications are refilled by her pharmacy.   Patient care signed out to Domenic Moras, PA-C, pending final re-evaluation prior to discharge.   Final Clinical Impressions(s) / ED Diagnoses   Final diagnoses:  None   1. Mild CHF   ED Discharge Orders    None         Dennie Bible 30/16/01 0932    Delora Fuel, MD 35/57/32 [phone]

## 2017-11-22 NOTE — ED Triage Notes (Signed)
Pt presents to ED via EMS. Recent dx of CHF and has been out of home Lasix x 2 days bc has not been able to pick up at pharmacy and is shob. Hx DM

## 2017-11-23 DIAGNOSIS — I509 Heart failure, unspecified: Secondary | ICD-10-CM | POA: Diagnosis not present

## 2017-11-23 LAB — COMPREHENSIVE METABOLIC PANEL
ALK PHOS: 57 U/L (ref 38–126)
ALT: 16 U/L (ref 0–44)
ANION GAP: 7 (ref 5–15)
AST: 15 U/L (ref 15–41)
Albumin: 2.7 g/dL — ABNORMAL LOW (ref 3.5–5.0)
BUN: 25 mg/dL — ABNORMAL HIGH (ref 8–23)
CALCIUM: 9 mg/dL (ref 8.9–10.3)
CO2: 23 mmol/L (ref 22–32)
Chloride: 107 mmol/L (ref 98–111)
Creatinine, Ser: 0.93 mg/dL (ref 0.44–1.00)
GLUCOSE: 238 mg/dL — AB (ref 70–99)
Potassium: 3.7 mmol/L (ref 3.5–5.1)
Sodium: 137 mmol/L (ref 135–145)
Total Bilirubin: 0.5 mg/dL (ref 0.3–1.2)
Total Protein: 5.5 g/dL — ABNORMAL LOW (ref 6.5–8.1)

## 2017-11-23 LAB — BRAIN NATRIURETIC PEPTIDE: B Natriuretic Peptide: 128.2 pg/mL — ABNORMAL HIGH (ref 0.0–100.0)

## 2017-11-23 LAB — TROPONIN I: Troponin I: <0.03 ng/mL (ref <0.03)

## 2017-11-23 MED ORDER — FUROSEMIDE 10 MG/ML IJ SOLN
40.0000 mg | Freq: Once | INTRAMUSCULAR | Status: AC
  Administered 2017-11-23: 40 mg via INTRAVENOUS
  Filled 2017-11-23: qty 4

## 2017-11-23 MED ORDER — LOSARTAN POTASSIUM 50 MG PO TABS
50.0000 mg | ORAL_TABLET | Freq: Once | ORAL | Status: AC
  Administered 2017-11-23: 50 mg via ORAL
  Filled 2017-11-23: qty 1

## 2017-11-23 MED ORDER — AZITHROMYCIN 250 MG PO TABS: ORAL_TABLET | ORAL | 0 refills | Status: DC

## 2017-11-23 MED ORDER — ACETAMINOPHEN 325 MG PO TABS
650.0000 mg | ORAL_TABLET | Freq: Once | ORAL | Status: AC
  Administered 2017-11-23: 650 mg via ORAL
  Filled 2017-11-23: qty 2

## 2017-11-23 NOTE — Discharge Instructions (Signed)
Follow up with your doctor today to insure your medications are being filled by your pharmacy, including Losartan, gabapentin, Lasix and allopurinol.

## 2017-11-23 NOTE — ED Provider Notes (Signed)
Patient was seen by Ferne Reus, PA-C last night for SOB.  It was felt that it's due to mild CHF.  Pt was boarding last night waiting for family member to pick her up.  On reassessment, she did report her SOB did improve.  She is scheduled to f/u with her PCP.  She did report developing some burning sensation to her midchest which started last night.  It is mild and non pleuritic.  On exam, reproducible chest wall tenderness to anterior chest without overlying skin changes. Low suspicion of PE or ACS.  Will repeat EKG.  CXR indicate patchy airspace disease at the right base which may reflect atelectasis or pneumonia.  Due to mildly elevated BNP and pt having cp/sob with potential pna, will prescribe Zpak.    7:38 AM Repeat EKG unchanged   EKG Interpretation  Date/Time:  Thursday November 23 2017 07:26:45 EDT Ventricular Rate:  79 PR Interval:  154 QRS Duration: 88 QT Interval:  386 QTC Calculation: 442 R Axis:   77 Text Interpretation:  Normal sinus rhythm T wave abnormality, consider inferior ischemia Abnormal ECG Similar to prior. No STEMI.  Confirmed by Nanda Quinton [id number]) on 11/23/2017 7:36:01 AM        BP (!) 164/80 (BP Location: Right Arm)   Pulse 84   Temp 98.2 F (36.8 C) (Oral)   Resp 16   SpO2 97%   Results for orders placed or performed during the hospital encounter of 11/22/17  Brain natriuretic peptide  Result Value Ref Range   B Natriuretic Peptide 128.2 (H) 0.0 - 100.0 pg/mL  CBC with Differential  Result Value Ref Range   WBC 6.4 4.0 - 10.5 K/uL   RBC 4.00 3.87 - 5.11 MIL/uL   Hemoglobin 11.4 (L) 12.0 - 15.0 g/dL   HCT 34.9 (L) 36.0 - 46.0 %   MCV 87.3 78.0 - 100.0 fL   MCH 28.5 26.0 - 34.0 pg   MCHC 32.7 30.0 - 36.0 g/dL   RDW 13.4 11.5 - 15.5 %   Platelets 251 150 - 400 K/uL   Neutrophils Relative % 73 %   Neutro Abs 4.7 1.7 - 7.7 K/uL   Lymphocytes Relative 17 %   Lymphs Abs 1.1 0.7 - 4.0 K/uL   Monocytes Relative 7 %   Monocytes Absolute 0.5 0.1 - 1.0  K/uL   Eosinophils Relative 2 %   Eosinophils Absolute 0.1 0.0 - 0.7 K/uL   Basophils Relative 1 %   Basophils Absolute 0.0 0.0 - 0.1 K/uL   Immature Granulocytes 0 %   Abs Immature Granulocytes 0.0 0.0 - 0.1 K/uL  Troponin I  Result Value Ref Range   Troponin I <0.03 <0.03 ng/mL  Comprehensive metabolic panel  Result Value Ref Range   Sodium 137 135 - 145 mmol/L   Potassium 3.7 3.5 - 5.1 mmol/L   Chloride 107 98 - 111 mmol/L   CO2 23 22 - 32 mmol/L   Glucose, Bld 238 (H) 70 - 99 mg/dL   BUN 25 (H) 8 - 23 mg/dL   Creatinine, Ser 0.93 0.44 - 1.00 mg/dL   Calcium 9.0 8.9 - 10.3 mg/dL   Total Protein 5.5 (L) 6.5 - 8.1 g/dL   Albumin 2.7 (L) 3.5 - 5.0 g/dL   AST 15 15 - 41 U/L   ALT 16 0 - 44 U/L   Alkaline Phosphatase 57 38 - 126 U/L   Total Bilirubin 0.5 0.3 - 1.2 mg/dL   GFR calc non Af  Amer >60 >60 mL/min   GFR calc Af Amer >60 >60 mL/min   Anion gap 7 5 - 15   Dg Chest 2 View  Result Date: 11/22/2017 CLINICAL DATA:  Short of breath EXAM: CHEST - 2 VIEW COMPARISON:  09/19/2017, 09/30/2016, CT chest 09/30/2016 FINDINGS: Small pleural effusions. Mild cardiomegaly with vascular congestion. Diffuse increased interstitial opacity most likely representing pulmonary edema. Patchy airspace disease at the right base. IMPRESSION: 1. Mild cardiomegaly with vascular congestion, small pleural effusion and mild interstitial edema 2. Patchy airspace disease at the right base may reflect atelectasis or pneumonia Electronically Signed   By: Donavan Foil M.D.   On: 11/22/2017 23:14      Domenic Moras, PA-C 11/23/17 0301    Margette Fast, MD 11/23/17 2024

## 2017-11-23 NOTE — ED Notes (Signed)
Patient verbalizes understanding of discharge instructions. Opportunity for questioning and answers were provided. Armband removed by staff, pt discharged from ED, attempting to find ride home with personal cell phone.

## 2017-11-23 NOTE — ED Notes (Signed)
PA tran notified of chest pain  And at bedside.

## 2017-11-23 NOTE — ED Notes (Signed)
Removed pt from oxygen per [facility]. Pt has stayed around 95% on room air. Ambulated pt on Pulse Ox to the bathroom with no difficulty. Pt stated that she felt a little dizzy on the way back to the bed. PA notified

## 2017-11-27 ENCOUNTER — Encounter (HOSPITAL_COMMUNITY): Payer: Self-pay | Admitting: Internal Medicine

## 2017-11-27 ENCOUNTER — Other Ambulatory Visit: Payer: Self-pay

## 2017-11-27 ENCOUNTER — Inpatient Hospital Stay (HOSPITAL_COMMUNITY): Payer: Medicare HMO

## 2017-11-27 ENCOUNTER — Inpatient Hospital Stay (HOSPITAL_COMMUNITY)
Admission: EM | Admit: 2017-11-27 | Discharge: 2017-11-30 | DRG: 291 | Disposition: A | Discharge status: Home / Self Care | Payer: Medicare HMO | Attending: Internal Medicine | Admitting: Internal Medicine

## 2017-11-27 ENCOUNTER — Emergency Department (HOSPITAL_COMMUNITY): Payer: Medicare HMO

## 2017-11-27 DIAGNOSIS — E871 Hypo-osmolality and hyponatremia: Secondary | ICD-10-CM | POA: Diagnosis present

## 2017-11-27 DIAGNOSIS — I5033 Acute on chronic diastolic (congestive) heart failure: Secondary | ICD-10-CM | POA: Diagnosis present

## 2017-11-27 DIAGNOSIS — Z8673 Personal history of transient ischemic attack (TIA), and cerebral infarction without residual deficits: Secondary | ICD-10-CM | POA: Diagnosis not present

## 2017-11-27 DIAGNOSIS — R0602 Shortness of breath: Secondary | ICD-10-CM | POA: Diagnosis not present

## 2017-11-27 DIAGNOSIS — R0603 Acute respiratory distress: Secondary | ICD-10-CM

## 2017-11-27 DIAGNOSIS — Z79899 Other long term (current) drug therapy: Secondary | ICD-10-CM | POA: Diagnosis not present

## 2017-11-27 DIAGNOSIS — R079 Chest pain, unspecified: Secondary | ICD-10-CM | POA: Diagnosis not present

## 2017-11-27 DIAGNOSIS — E785 Hyperlipidemia, unspecified: Secondary | ICD-10-CM | POA: Diagnosis present

## 2017-11-27 DIAGNOSIS — R0600 Dyspnea, unspecified: Secondary | ICD-10-CM | POA: Diagnosis not present

## 2017-11-27 DIAGNOSIS — E1165 Type 2 diabetes mellitus with hyperglycemia: Secondary | ICD-10-CM | POA: Diagnosis present

## 2017-11-27 DIAGNOSIS — Z794 Long term (current) use of insulin: Secondary | ICD-10-CM

## 2017-11-27 DIAGNOSIS — Z823 Family history of stroke: Secondary | ICD-10-CM | POA: Diagnosis not present

## 2017-11-27 DIAGNOSIS — J9601 Acute respiratory failure with hypoxia: Secondary | ICD-10-CM | POA: Diagnosis present

## 2017-11-27 DIAGNOSIS — I5043 Acute on chronic combined systolic (congestive) and diastolic (congestive) heart failure: Secondary | ICD-10-CM

## 2017-11-27 DIAGNOSIS — Z91040 Latex allergy status: Secondary | ICD-10-CM

## 2017-11-27 DIAGNOSIS — M109 Gout, unspecified: Secondary | ICD-10-CM | POA: Diagnosis present

## 2017-11-27 DIAGNOSIS — R069 Unspecified abnormalities of breathing: Secondary | ICD-10-CM | POA: Diagnosis not present

## 2017-11-27 DIAGNOSIS — I252 Old myocardial infarction: Secondary | ICD-10-CM | POA: Diagnosis not present

## 2017-11-27 DIAGNOSIS — Z833 Family history of diabetes mellitus: Secondary | ICD-10-CM | POA: Diagnosis not present

## 2017-11-27 DIAGNOSIS — R0789 Other chest pain: Secondary | ICD-10-CM | POA: Diagnosis not present

## 2017-11-27 DIAGNOSIS — I110 Hypertensive heart disease with heart failure: Principal | ICD-10-CM | POA: Diagnosis present

## 2017-11-27 DIAGNOSIS — N179 Acute kidney failure, unspecified: Secondary | ICD-10-CM | POA: Diagnosis present

## 2017-11-27 DIAGNOSIS — Z91018 Allergy to other foods: Secondary | ICD-10-CM

## 2017-11-27 DIAGNOSIS — F419 Anxiety disorder, unspecified: Secondary | ICD-10-CM | POA: Diagnosis present

## 2017-11-27 DIAGNOSIS — Z7982 Long term (current) use of aspirin: Secondary | ICD-10-CM | POA: Diagnosis not present

## 2017-11-27 DIAGNOSIS — Z885 Allergy status to narcotic agent status: Secondary | ICD-10-CM | POA: Diagnosis not present

## 2017-11-27 DIAGNOSIS — J9621 Acute and chronic respiratory failure with hypoxia: Secondary | ICD-10-CM | POA: Diagnosis not present

## 2017-11-27 DIAGNOSIS — E119 Type 2 diabetes mellitus without complications: Secondary | ICD-10-CM | POA: Diagnosis not present

## 2017-11-27 DIAGNOSIS — I10 Essential (primary) hypertension: Secondary | ICD-10-CM | POA: Diagnosis present

## 2017-11-27 HISTORY — DX: Acute kidney failure, unspecified: N17.9

## 2017-11-27 HISTORY — DX: Acute on chronic combined systolic (congestive) and diastolic (congestive) heart failure: I50.43

## 2017-11-27 HISTORY — DX: Acute respiratory distress: R06.03

## 2017-11-27 LAB — CBC
HCT: 34.5 % — ABNORMAL LOW (ref 36.0–46.0)
Hemoglobin: 11.4 g/dL — ABNORMAL LOW (ref 12.0–15.0)
MCH: 28.7 pg (ref 26.0–34.0)
MCHC: 33 g/dL (ref 30.0–36.0)
MCV: 86.9 fL (ref 78.0–100.0)
Platelets: 260 10*3/uL (ref 150–400)
RBC: 3.97 MIL/uL (ref 3.87–5.11)
RDW: 13.2 % (ref 11.5–15.5)
WBC: 5.8 10*3/uL (ref 4.0–10.5)

## 2017-11-27 LAB — I-STAT TROPONIN, ED: TROPONIN I, POC: 0.03 ng/mL (ref 0.00–0.08)

## 2017-11-27 LAB — HIV ANTIBODY (ROUTINE TESTING W REFLEX): HIV SCREEN 4TH GENERATION: NONREACTIVE

## 2017-11-27 LAB — BASIC METABOLIC PANEL
Anion gap: 9 (ref 5–15)
BUN: 35 mg/dL — ABNORMAL HIGH (ref 8–23)
CHLORIDE: 105 mmol/L (ref 98–111)
CO2: 23 mmol/L (ref 22–32)
CREATININE: 1.39 mg/dL — AB (ref 0.44–1.00)
Calcium: 9.2 mg/dL (ref 8.9–10.3)
GFR calc Af Amer: 46 mL/min — ABNORMAL LOW (ref >60)
GFR calc non Af Amer: 40 mL/min — ABNORMAL LOW (ref >60)
Glucose, Bld: 362 mg/dL — ABNORMAL HIGH (ref 70–99)
Potassium: 3.7 mmol/L (ref 3.5–5.1)
Sodium: 137 mmol/L (ref 135–145)

## 2017-11-27 LAB — GLUCOSE, CAPILLARY
GLUCOSE-CAPILLARY: 117 mg/dL — AB (ref 70–99)
GLUCOSE-CAPILLARY: 318 mg/dL — AB (ref 70–99)
Glucose-Capillary: 266 mg/dL — ABNORMAL HIGH (ref 70–99)
Glucose-Capillary: 313 mg/dL — ABNORMAL HIGH (ref 70–99)

## 2017-11-27 LAB — HEMOGLOBIN A1C
Hgb A1c MFr Bld: 9.6 % — ABNORMAL HIGH (ref 4.8–5.6)
Mean Plasma Glucose: 228.82 mg/dL

## 2017-11-27 LAB — ECHOCARDIOGRAM COMPLETE
Height: 59 in
WEIGHTICAEL: 2790.14 [oz_av]

## 2017-11-27 LAB — BRAIN NATRIURETIC PEPTIDE: B NATRIURETIC PEPTIDE 5: 59.5 pg/mL (ref 0.0–100.0)

## 2017-11-27 LAB — D-DIMER, QUANTITATIVE: D-Dimer, Quant: 0.68 ug/mL-FEU — ABNORMAL HIGH (ref 0.00–0.50)

## 2017-11-27 MED ORDER — TIZANIDINE HCL 4 MG PO TABS
4.0000 mg | ORAL_TABLET | Freq: Three times a day (TID) | ORAL | Status: DC | PRN
  Filled 2017-11-27: qty 1

## 2017-11-27 MED ORDER — FUROSEMIDE 10 MG/ML IJ SOLN
40.0000 mg | Freq: Two times a day (BID) | INTRAMUSCULAR | Status: DC
  Administered 2017-11-27 – 2017-11-28 (×2): 40 mg via INTRAVENOUS
  Filled 2017-11-27 (×2): qty 4

## 2017-11-27 MED ORDER — AZITHROMYCIN 250 MG PO TABS
250.0000 mg | ORAL_TABLET | Freq: Once | ORAL | Status: AC
  Administered 2017-11-27: 250 mg via ORAL
  Filled 2017-11-27: qty 1

## 2017-11-27 MED ORDER — ENOXAPARIN SODIUM 40 MG/0.4ML ~~LOC~~ SOLN
40.0000 mg | SUBCUTANEOUS | Status: DC
  Administered 2017-11-27 – 2017-11-30 (×4): 40 mg via SUBCUTANEOUS
  Filled 2017-11-27 (×4): qty 0.4

## 2017-11-27 MED ORDER — PANTOPRAZOLE SODIUM 20 MG PO TBEC
20.0000 mg | DELAYED_RELEASE_TABLET | Freq: Every day | ORAL | Status: DC
  Administered 2017-11-27 – 2017-11-30 (×4): 20 mg via ORAL
  Filled 2017-11-27 (×4): qty 1

## 2017-11-27 MED ORDER — ALBUTEROL SULFATE (2.5 MG/3ML) 0.083% IN NEBU: 2.5000 mg | INHALATION_SOLUTION | RESPIRATORY_TRACT | Status: DC | PRN

## 2017-11-27 MED ORDER — POTASSIUM CHLORIDE CRYS ER 20 MEQ PO TBCR
20.0000 meq | EXTENDED_RELEASE_TABLET | Freq: Two times a day (BID) | ORAL | Status: AC
  Administered 2017-11-27 (×2): 20 meq via ORAL
  Filled 2017-11-27 (×2): qty 1

## 2017-11-27 MED ORDER — IOPAMIDOL (ISOVUE-370) INJECTION 76%
INTRAVENOUS | Status: AC
  Filled 2017-11-27: qty 100

## 2017-11-27 MED ORDER — ONDANSETRON HCL 4 MG/2ML IJ SOLN: 4.0000 mg | Freq: Four times a day (QID) | INTRAMUSCULAR | Status: DC | PRN

## 2017-11-27 MED ORDER — ACETAMINOPHEN 325 MG PO TABS
650.0000 mg | ORAL_TABLET | Freq: Once | ORAL | Status: AC
  Administered 2017-11-27: 650 mg via ORAL
  Filled 2017-11-27: qty 2

## 2017-11-27 MED ORDER — SODIUM CHLORIDE 0.9% FLUSH: 3.0000 mL | INTRAVENOUS | Status: DC | PRN

## 2017-11-27 MED ORDER — ALLOPURINOL 300 MG PO TABS
300.0000 mg | ORAL_TABLET | Freq: Every day | ORAL | Status: DC
  Administered 2017-11-27 – 2017-11-30 (×4): 300 mg via ORAL
  Filled 2017-11-27 (×4): qty 1

## 2017-11-27 MED ORDER — MECLIZINE HCL 25 MG PO TABS: 25.0000 mg | ORAL_TABLET | Freq: Two times a day (BID) | ORAL | Status: DC | PRN

## 2017-11-27 MED ORDER — MIDAZOLAM HCL 2 MG/2ML IJ SOLN
1.0000 mg | Freq: Once | INTRAMUSCULAR | Status: AC
  Administered 2017-11-27: 1 mg via INTRAVENOUS
  Filled 2017-11-27: qty 2

## 2017-11-27 MED ORDER — FUROSEMIDE 10 MG/ML IJ SOLN
20.0000 mg | Freq: Once | INTRAMUSCULAR | Status: AC
  Administered 2017-11-27: 20 mg via INTRAVENOUS
  Filled 2017-11-27: qty 2

## 2017-11-27 MED ORDER — METOCLOPRAMIDE HCL 5 MG PO TABS
5.0000 mg | ORAL_TABLET | Freq: Three times a day (TID) | ORAL | Status: DC
  Administered 2017-11-27 – 2017-11-30 (×10): 5 mg via ORAL
  Filled 2017-11-27 (×10): qty 1

## 2017-11-27 MED ORDER — ATORVASTATIN CALCIUM 20 MG PO TABS
20.0000 mg | ORAL_TABLET | Freq: Every day | ORAL | Status: DC
  Administered 2017-11-27 – 2017-11-30 (×4): 20 mg via ORAL
  Filled 2017-11-27 (×4): qty 1

## 2017-11-27 MED ORDER — INSULIN ASPART 100 UNIT/ML ~~LOC~~ SOLN
0.0000 [IU] | Freq: Every day | SUBCUTANEOUS | Status: DC
  Administered 2017-11-27: 4 [IU] via SUBCUTANEOUS
  Administered 2017-11-28: 2 [IU] via SUBCUTANEOUS
  Administered 2017-11-29: 3 [IU] via SUBCUTANEOUS

## 2017-11-27 MED ORDER — IOPAMIDOL (ISOVUE-370) INJECTION 76%
100.0000 mL | Freq: Once | INTRAVENOUS | Status: AC | PRN
  Administered 2017-11-27: 100 mL via INTRAVENOUS

## 2017-11-27 MED ORDER — CARVEDILOL 12.5 MG PO TABS
12.5000 mg | ORAL_TABLET | Freq: Two times a day (BID) | ORAL | Status: DC
  Administered 2017-11-27 – 2017-11-30 (×7): 12.5 mg via ORAL
  Filled 2017-11-27 (×7): qty 1

## 2017-11-27 MED ORDER — SODIUM CHLORIDE 0.9% FLUSH
3.0000 mL | Freq: Two times a day (BID) | INTRAVENOUS | Status: DC
  Administered 2017-11-27 – 2017-11-30 (×7): 3 mL via INTRAVENOUS

## 2017-11-27 MED ORDER — IBUPROFEN 200 MG PO TABS: 200.0000 mg | ORAL_TABLET | Freq: Four times a day (QID) | ORAL | Status: DC | PRN

## 2017-11-27 MED ORDER — INSULIN GLARGINE 100 UNIT/ML ~~LOC~~ SOLN
35.0000 [IU] | Freq: Every day | SUBCUTANEOUS | Status: DC
  Administered 2017-11-27 – 2017-11-28 (×2): 35 [IU] via SUBCUTANEOUS
  Filled 2017-11-27 (×2): qty 0.35

## 2017-11-27 MED ORDER — ALBUTEROL SULFATE (2.5 MG/3ML) 0.083% IN NEBU
5.0000 mg | INHALATION_SOLUTION | Freq: Once | RESPIRATORY_TRACT | Status: AC
  Administered 2017-11-27: 5 mg via RESPIRATORY_TRACT
  Filled 2017-11-27: qty 6

## 2017-11-27 MED ORDER — IOPAMIDOL (ISOVUE-370) INJECTION 76%: 100.0000 mL | Freq: Once | INTRAVENOUS | Status: DC | PRN

## 2017-11-27 MED ORDER — ASPIRIN EC 81 MG PO TBEC
81.0000 mg | DELAYED_RELEASE_TABLET | Freq: Every day | ORAL | Status: DC
  Administered 2017-11-27 – 2017-11-30 (×4): 81 mg via ORAL
  Filled 2017-11-27 (×4): qty 1

## 2017-11-27 MED ORDER — INSULIN ASPART 100 UNIT/ML ~~LOC~~ SOLN
0.0000 [IU] | Freq: Three times a day (TID) | SUBCUTANEOUS | Status: DC
  Administered 2017-11-27: 8 [IU] via SUBCUTANEOUS
  Administered 2017-11-27: 11 [IU] via SUBCUTANEOUS
  Administered 2017-11-28 (×2): 2 [IU] via SUBCUTANEOUS
  Administered 2017-11-28 – 2017-11-29 (×2): 5 [IU] via SUBCUTANEOUS
  Administered 2017-11-29 – 2017-11-30 (×3): 3 [IU] via SUBCUTANEOUS

## 2017-11-27 MED ORDER — LOSARTAN POTASSIUM 50 MG PO TABS
50.0000 mg | ORAL_TABLET | Freq: Every day | ORAL | Status: DC
  Administered 2017-11-27: 50 mg via ORAL
  Filled 2017-11-27: qty 1

## 2017-11-27 MED ORDER — GABAPENTIN 300 MG PO CAPS
600.0000 mg | ORAL_CAPSULE | Freq: Two times a day (BID) | ORAL | Status: DC
  Administered 2017-11-27 – 2017-11-30 (×7): 600 mg via ORAL
  Filled 2017-11-27 (×7): qty 2

## 2017-11-27 MED ORDER — FUROSEMIDE 10 MG/ML IJ SOLN
40.0000 mg | Freq: Once | INTRAMUSCULAR | Status: AC
  Administered 2017-11-27: 40 mg via INTRAVENOUS
  Filled 2017-11-27: qty 4

## 2017-11-27 MED ORDER — SODIUM CHLORIDE 0.9 % IV SOLN: 250.0000 mL | INTRAVENOUS | Status: DC | PRN

## 2017-11-27 NOTE — Progress Notes (Signed)
  Echocardiogram 2D Echocardiogram has been performed.  Johny Chess 11/27/2017, 2:48 PM

## 2017-11-27 NOTE — ED Notes (Signed)
Report called  

## 2017-11-27 NOTE — Care Management (Signed)
This is a no charge note  Pending admission per Dr. Kathrynn Humble  62 year old lady with a past medical history of hypertension, hyperlipidemia, diabetes mellitus, stroke, GERD, dCHF, CAD, CKD 3, who presents with shortness of breath.  Patient was diagnosed with pneumonia on 8/21, still taking azithromycin.  She developed worsening shortness of breath, dry cough and chest tightness.  D-dimer positive at 0.68, CT angiogram is negative for PE, but showed mild pulmonary edema.  Chest x-ray showed vascular congestion and cardiomegaly.  Clinically consistent with CHF exacerbation even though BNP is normal.  Patient was put on BiPAP with improvement.  Per ED, physician, patient desaturated to 60% when sleeping (off BiPAP).  Patient was given 20 mg of Lasix by EDP.  I ordered another 40 mg Lasix.   Ivor Costa, MD  [facility] Pager [phone]  If 7PM-7AM, please contact night-coverage [email or web address] Password [facility] 11/27/2017, 6:22 AM

## 2017-11-27 NOTE — Progress Notes (Signed)
Pt transported to and from CT without event. RT will continue to monitor as needed.

## 2017-11-27 NOTE — ED Notes (Signed)
BiPAP d/c'd per patient request and PA order.

## 2017-11-27 NOTE — ED Triage Notes (Signed)
PER EMS PT STATED SHE WAS DX ON 8/21 WITH PNEUMONIA and still feels SOB and chest tightness with a dry cough. Pt stated she has been taking the antibiotics and her lasix for CHF. 160/94, 88 p, 995 ra, cbg 407

## 2017-11-27 NOTE — ED Notes (Signed)
Pt reports recent diagnosis of pneumonia. Pt reports taking her Zpac and not getting any better. Pt reports increased SOB this evening/morning which prompted her calling 9-1-1.

## 2017-11-27 NOTE — H&P (Signed)
History and Physical    Kara Hanson TDV:[id number] DOB: 07-29-1955 DOA: 11/27/2017  PCP: [name], MD Patient coming from: home  Chief Complaint: sob  HPI: Kara Hanson is a 62 y.o. female with medical history significant for chronic diastolic heart failure, dm, htn, mi, stroke presents to the emergency department with chief complaint shortness of breath. Initial evaluation reveals acute respiratory distress likely related to acute on chronic diastolic heart failure in the setting of resolving community-acquired pneumonia. [facility] are asked to admit.  Information is obtained from the patient and the daughter who is at the bedside. Patient states proximally 5 days ago she was diagnosed with pneumonia is currently completing a Z-Pak. During this time she's had persistent dry cough that has been stable. She states during the right last night she had an episode worsening shortness of breath that awakened her. She reports "not being able to take a deep breath". She tried to go back to sleep became anxious and called 911. Associated symptoms include headache. She denies chest pain palpitations, diaphoresis, fever chills worsening lower extremity edema. She denies abdominal pain nausea vomiting. She denies any dysuria hematuria frequency or urgency. No diarrhea constipation melena.  ED Course: in the emergency department she is afebrile hypertensive with tachypnea and hypoxia with sleeping on room air. She is provided with DuoNeb's with little relief. She is placed on BiPAP due to increased work of breathing and provided with Lasix 60 mg IV. At the time of admission she has no increased work of breathing and oxygen saturation level greater than 90% on 2 L nasal cannula.  Review of Systems: As per HPI otherwise all other systems reviewed and are negative.   Ambulatory Status:ambulates independently is independent with ADLs  Past Medical History:  Diagnosis Date  . CHF  (congestive heart failure) ([place])   . CVA (cerebral vascular accident) ([place])   . Diabetes mellitus without complication ([place])   . Hypercholesteremia   . Hypertension   . Myocardial infarction ([place])   . Spinal stenosis     Past Surgical History:  Procedure Laterality Date  . BLADDER SURGERY    . CARDIAC CATHETERIZATION N/A 04/25/2016   Procedure: Left Heart Cath and Coronary Angiography;  Surgeon: Lorretta Harp, MD;  Location: [facility];  Service: Cardiovascular;  Laterality: N/A;  . CESAREAN SECTION    . CHOLECYSTECTOMY      Social History   Socioeconomic History  . Marital status: Widowed    Spouse name: Not on file  . Number of children: Not on file  . Years of education: Not on file  . Highest education level: Not on file  Occupational History  . Not on file  Social Needs  . Financial resource strain: Not on file  . Food insecurity:    Worry: Not on file    Inability: Not on file  . Transportation needs:    Medical: Not on file    Non-medical: Not on file  Tobacco Use  . Smoking status: Never Smoker  . Smokeless tobacco: Never Used  Substance and Sexual Activity  . Alcohol use: No  . Drug use: No  . Sexual activity: Not Currently    Birth control/protection: None  Lifestyle  . Physical activity:    Days per week: Not on file    Minutes per session: Not on file  . Stress: Not on file  Relationships  . Social connections:    Talks on phone: Not on file  Gets together: Not on file    Attends religious service: Not on file    Active member of club or organization: Not on file    Attends meetings of clubs or organizations: Not on file    Relationship status: Not on file  . Intimate partner violence:    Fear of current or ex partner: Not on file    Emotionally abused: Not on file    Physically abused: Not on file    Forced sexual activity: Not on file  Other Topics Concern  . Not on file  Social History Narrative  . Not on file    Allergies   Allergen Reactions  . Garlic Shortness Of Breath, Itching and Swelling    Hand itching and swelling  . Latex Itching  . Morphine And Related Itching and Other (See Comments)    Headache   . Other Itching    Reaction to newspaper ink -itching and headache    Family History  Problem Relation Age of Onset  . Diabetes Mellitus II Father   . Stroke Father     Prior to Admission medications   Medication Sig Start Date End Date Taking? Authorizing Provider  allopurinol (ZYLOPRIM) 300 MG tablet Take 1 tablet (300 mg total) by mouth daily. 11/14/17  Yes [name], MD  aspirin EC 81 MG tablet Take 81 mg by mouth daily.   Yes [provider]  atorvastatin (LIPITOR) 20 MG tablet Take 1 tablet (20 mg total) by mouth daily. 11/14/17  Yes [name], MD  azithromycin (ZITHROMAX Z-PAK) 250 MG tablet 2 po day one, then 1 daily x 4 days 11/23/17  Yes Domenic Moras, PA-C  carvedilol (COREG) 12.5 MG tablet Take 1 tablet (12.5 mg total) by mouth 2 (two) times daily with a meal. 11/14/17  Yes Newlin, Enobong, MD  furosemide (LASIX) 40 MG tablet Take 1 tablet (40 mg total) by mouth daily. 11/14/17  Yes [name], MD  gabapentin (NEURONTIN) 300 MG capsule Take 2 capsules (600 mg total) by mouth 2 (two) times daily. 11/14/17  Yes [name], MD  ibuprofen (ADVIL,MOTRIN) 200 MG tablet Take 200-600 mg by mouth every 6 (six) hours as needed for headache, mild pain or moderate pain.    Yes [provider]  insulin aspart (NOVOLOG) 100 UNIT/ML injection 0 to 12 units subcutaneously 3 times daily before meals as per sliding scale Patient taking differently: Inject 0-12 Units into the skin 3 (three) times daily with meals. per sliding scale 11/14/17  Yes Newlin, Enobong, MD  Insulin Glargine (BASAGLAR KWIKPEN) 100 UNIT/ML SOPN Inject 35 units into the skin daily. 11/21/17  Yes [name], MD  lansoprazole (PREVACID) 15 MG capsule Take 15 mg by mouth daily.   Yes [provider]  losartan (COZAAR) 50 MG tablet Take 1 tablet (50 mg total) by mouth daily. 09/28/17  Yes McClung, Dionne Bucy, PA-C  meclizine (ANTIVERT) 25 MG tablet Take 1 tablet (25 mg total) by mouth 2 (two) times daily as needed. Patient taking differently: Take 25 mg by mouth 2 (two) times daily as needed (vertigo).  05/06/16  Yes [name], MD  metoCLOPramide (REGLAN) 5 MG tablet Take 1 tablet (5 mg total) by mouth 3 (three) times daily before meals. 11/14/17  Yes [name], MD  Multiple Vitamin (MULTIVITAMIN WITH MINERALS) TABS tablet Take 1 tablet by mouth daily. Women's One a Day   Yes [provider]  tiZANidine (ZANAFLEX) 4 MG tablet Take 1 tablet (4 mg  total) by mouth every 8 (eight) hours as needed for muscle spasms. 11/14/17  Yes [name], MD  Blood Glucose Monitoring Suppl (ONE TOUCH ULTRA MINI) w/Device KIT UAD UP TO THREE TIMES DAILY DX E11.4 11/20/17   [name], MD  glucose blood (ONE TOUCH ULTRA TEST) test strip UAD UP TO THREE TIMES DAILY DX E11.4 11/20/17   [name], MD  Lancet Device MISC Use as instructed 3 times daily 09/19/17   Nils Flack, Gavin Pound, PA-C  ONETOUCH DELICA LANCETS 62G MISC UAD UP TO THREE TIMES DAILY DX E11.4 11/20/17   [name], MD    Physical Exam: Vitals:   11/27/17 0615 11/27/17 0630 11/27/17 0645 11/27/17 0700  BP: (!) 167/59 (!) 164/50 (!) 158/68 (!) 180/66  Pulse: 81 77 85 85  Resp: (!) 22 17 (!) 25 (!) 25  Temp:      SpO2: 96% 99% 100% 98%  Weight:      Height:         General:  Appears somewhat anxious but not uncomfortable Eyes:  PERRL, EOMI, normal lids, iris ENT:  grossly normal hearing, lips & tongue, mucous membranes of her mouth are pink but dry Neck:  no LAD, masses or thyromegaly Cardiovascular:  RRR, no m/r/g. trace LE edema.  Respiratory:  No increased work of breathing. Breath sounds are somewhat distant throughout hear fine crackles bilateral bases Abdomen:  soft, ntnd, positive bowel sounds  throughout Skin:  no rash or induration seen on limited exam Musculoskeletal:  grossly normal tone BUE/BLE, good ROM, no bony abnormality Psychiatric:  grossly normal mood and affect, speech fluent and appropriate, AOx3 Neurologic:  CN 2-12 grossly intact, moves all extremities in coordinated fashion, sensation intact  Labs on Admission: I have personally reviewed following labs and imaging studies  CBC: Recent Labs  Lab 11/22/17 2330 11/27/17 0054  WBC 6.4 5.8  NEUTROABS 4.7  --   HGB 11.4* 11.4*  HCT 34.9* 34.5*  MCV 87.3 86.9  PLT 251 315   Basic Metabolic Panel: Recent Labs  Lab 11/22/17 2330 11/27/17 0054  NA 137 137  K 3.7 3.7  CL 107 105  CO2 23 23  GLUCOSE 238* 362*  BUN 25* 35*  CREATININE 0.93 1.39*  CALCIUM 9.0 9.2   GFR: Estimated Creatinine Clearance: 37.7 mL/min (A) (by C-G formula based on SCr of 1.39 mg/dL (H)). Liver Function Tests: Recent Labs  Lab 11/22/17 2330  AST 15  ALT 16  ALKPHOS 57  BILITOT 0.5  PROT 5.5*  ALBUMIN 2.7*   No results for input(s): LIPASE, AMYLASE in the last 168 hours. No results for input(s): AMMONIA in the last 168 hours. Coagulation Profile: No results for input(s): INR, PROTIME in the last 168 hours. Cardiac Enzymes: Recent Labs  Lab 11/22/17 2330  TROPONINI <0.03   BNP (last 3 results) No results for input(s): PROBNP in the last 8760 hours. HbA1C: No results for input(s): HGBA1C in the last 72 hours. CBG: No results for input(s): GLUCAP in the last 168 hours. Lipid Profile: No results for input(s): CHOL, HDL, LDLCALC, TRIG, CHOLHDL, LDLDIRECT in the last 72 hours. Thyroid Function Tests: No results for input(s): TSH, T4TOTAL, FREET4, T3FREE, THYROIDAB in the last 72 hours. Anemia Panel: No results for input(s): VITAMINB12, FOLATE, FERRITIN, TIBC, IRON, RETICCTPCT in the last 72 hours. Urine analysis:    Component Value Date/Time   COLORURINE STRAW (A) 06/17/2016 1507   APPEARANCEUR CLEAR 06/17/2016  1507   LABSPEC 1.020 06/17/2016 1507  PHURINE 7.0 06/17/2016 1507   GLUCOSEU >=500 (A) 06/17/2016 1507   HGBUR NEGATIVE 06/17/2016 1507   BILIRUBINUR NEGATIVE 06/17/2016 1507   KETONESUR NEGATIVE 06/17/2016 1507   PROTEINUR 100 (A) 06/17/2016 1507   NITRITE NEGATIVE 06/17/2016 1507   LEUKOCYTESUR TRACE (A) 06/17/2016 1507    Creatinine Clearance: Estimated Creatinine Clearance: 37.7 mL/min (A) (by C-G formula based on SCr of 1.39 mg/dL (H)).  Sepsis Labs: '@LABRCNTIP'$ (procalcitonin:4,lacticidven:4) )No results found for this or any previous visit (from the past 240 hour(s)).   Radiological Exams on Admission: Dg Chest 2 View  Result Date: 11/27/2017 CLINICAL DATA:  Shortness of breath EXAM: CHEST - 2 VIEW COMPARISON:  11/22/2017 FINDINGS: Mild cardiomegaly and vascular congestion. Bibasilar atelectasis or infiltrates. No significant effusions or acute bony abnormality. IMPRESSION: Cardiomegaly with vascular congestion. Bibasilar atelectasis or infiltrates. Findings similar to prior study. Electronically Signed   By: Rolm Baptise M.D.   On: 11/27/2017 01:31   Ct Angio Chest Pe W/cm &/or Wo Cm  Result Date: 11/27/2017 CLINICAL DATA:  62 y/o F; chest tightness, shortness of breath, dry cough. PE suspected, intermediate probability, positive D-dimer. EXAM: CT ANGIOGRAPHY CHEST WITH CONTRAST TECHNIQUE: Multidetector CT imaging of the chest was performed using the standard protocol during bolus administration of intravenous contrast. Multiplanar CT image reconstructions and MIPs were obtained to evaluate the vascular anatomy. CONTRAST:  69 cc Isovue 370 COMPARISON:  11/27/2017 chest radiograph.  09/30/2016 CT chest. FINDINGS: Cardiovascular: Stable cardiomegaly. No pericardial effusion. Normal caliber thoracic aorta and main pulmonary artery. Mild aortic and moderate coronary artery calcific atherosclerosis. Satisfactory opacification of the pulmonary arteries. Mediastinum/Nodes: No enlarged  mediastinal, hilar, or axillary lymph nodes. Thyroid gland, trachea, and esophagus demonstrate no significant findings. Lungs/Pleura: Interlobular septal thickening in the lung bases. Small right pleural effusion. Small consolidation within the dependent right posterior lower lobe. Upper Abdomen: No acute abnormality. Musculoskeletal: No chest wall abnormality. No acute or significant osseous findings. Review of the MIP images confirms the above findings. IMPRESSION: 1. Mild interstitial pulmonary edema and small right pleural effusion. 2. Small consolidation within the dependent right lower lobe is probably atelectasis, less likely pneumonia. 3. Stable cardiomegaly. 4. Mild aortic and moderate coronary artery calcific atherosclerosis. 5. No pulmonary embolus identified. Electronically Signed   By: Kristine Garbe M.D.   On: 11/27/2017 05:18    EKG: Independently reviewed.Sinus rhythm Borderline repolarization abnormality  Assessment/Plan Principal Problem:   Acute respiratory distress Active Problems:   Acute on chronic diastolic CHF (congestive heart failure) ([facility])   Controlled type 2 diabetes mellitus with hyperglycemia ([facility])   Hypertension   Acute kidney injury ([place])   HLD (hyperlipidemia)   #1. Acute respiratory distress likely related to acute on chronic diastolic failure in the setting of resolving community-acquired pneumonia. Patient with tachypnea, anxiety and hypoxia with sleeping. BNP within the limits of normal. D-dimer slightly elevated. CT of the chest negative for PE but reveals cardiomegaly and vascular congestion with stable atelectasis versus infiltrates. Initial troponin negative. EKG no acute changes. Lasix IV and BiPAP provided for tachypnea she was unable to tolerate the mask so she was provided with Versed. Oxygen saturation dropped to 70% while sleeping. BiPAP weaned. At time of admission oxygen saturation level greater than 90% on nasal cannula -Admit to  telemetry -continue IV Lasix 40 mg bid -Monitor intake and output -Obtain daily weights -continue home Coreg and losartan -Obtain a 2-D echo -Continue oxygen supplementation -Wean oxygen as able  #2. Acute on chronic diastolic heart failure. See #  1. BNP within the limits of normal. Patient with a history of chronic diastolic heart failure. Echo in March 2018 revealed EF of 55% with grade 2 diastolic dysfunction -Lasix as noted above -Continue home Coreg and losartan -Obtain a 2-D echo -Monitor intake and output -Obtain daily weights -Outpatient follow-up with cardiology  #3. Acute kidney injury. Creatinine 1.3 on admission. Potassium 3.7, magnesium 1.8 -See #1 and #2 -Hold nephrotoxins as able -Potassium supplementation -monitor urine output -Recheck in the morning  #4. Diabetes. Uncontrolled. Most recent hemoglobin A1c 13.6 just 2 months ago. Chart review indicates patient ran out of Lantus at the beginning of the month and went to the clinic. Serum glucose 162 on admission -Continue home Lantus -Obtain hemoglobin A1c -Sliding scale insulin for optimal control -continue reglan  #5. Hypertension. fair control in the emergency department. Medications include Coreg, losartan, Lasix -Continue home meds as noted above -Monitor closely  #6. Hyperlipidemia -continue statin    DVT prophylaxis: lovenox  Code Status: full Family Communication: daughter at bedside  Disposition Plan: home  Consults called: none  Admission status: inpatient    Radene Gunning MD [facility]  If 7PM-7AM, please contact night-coverage [email or web address] Password [email or web address]  11/27/2017, 7:09 AM

## 2017-11-27 NOTE — ED Provider Notes (Signed)
[facility] EMERGENCY DEPARTMENT Provider Note   CSN: [id number] Arrival date & time: 11/27/17  0041     History   Chief Complaint Chief Complaint  Patient presents with  . Shortness of Breath    HPI Kara Hanson is a 62 y.o. female.  HPI 62 year old female past medical history significant for hypertension, hyperlipidemia, diabetes, CHF, MI presents to the emergency department today valuation of ongoing shortness of breath.  Patient states that on 8/21 she was diagnosed with pneumonia.  She has been taking her Z-Pak and her Lasix as prescribed for her CHF.  She states that this evening she had another episode that awoke her from sleep that she became very short of breath and could not take a deep breath.  Patient became very anxious and cannot go back to sleep and had a continued difficulties breathing and called 911 to come to the ED for further evaluation.  Patient reports ongoing dry cough.  She reports some lower leg swelling.  She denies any fevers or chills.  She does report some substernal chest pain that radiates to the left arm at times.  This will last for several seconds and self resolved.  Not associate with exertion.  She denies any associated nausea, emesis or diaphoresis.  Patient does report wheezing intermittently.  She denies any tobacco use.  She reports taking her Lasix as prescribed.  Nothing makes her symptoms better or worse.  She was given a breathing treatment which did not help her symptoms prior to arrival.  Patient denies any history of DVT/PE, prolonged immobilization, recent hospitalization/surgery, hemoptysis, cancer diagnosis.  She does report some mild bilateral leg swelling.  Pt denies any fever, chill, ha, vision changes, lightheadedness, dizziness, congestion, neck pain,  abd pain, n/v/d, urinary symptoms, change in bowel habits, melena, hematochezia, lower extremity paresthesias.  Past Medical History:  Diagnosis Date  . CHF  (congestive heart failure) ([place])   . CVA (cerebral vascular accident) ([place])   . Diabetes mellitus without complication ([place])   . Hypercholesteremia   . Hypertension   . Myocardial infarction ([place])   . Spinal stenosis     Patient Active Problem List   Diagnosis Date Noted  . Bulging lumbar disc 11/14/2017  . Gout 11/14/2017  . HLD (hyperlipidemia) 09/30/2016  . Chest pain 06/17/2016  . Stress-induced cardiomyopathy 05/19/2016  . Type 2 diabetes mellitus with diabetic neuropathy, unspecified ([place]) 05/06/2016  . Vertigo 05/06/2016  . Controlled type 2 diabetes mellitus with hyperglycemia ([place]) 04/23/2016  . Hypertension 04/23/2016  . Normochromic normocytic anemia 04/23/2016  . Syncope 04/23/2016    Past Surgical History:  Procedure Laterality Date  . BLADDER SURGERY    . CARDIAC CATHETERIZATION N/A 04/25/2016   Procedure: Left Heart Cath and Coronary Angiography;  Surgeon: Lorretta Harp, MD;  Location: [facility];  Service: Cardiovascular;  Laterality: N/A;  . CESAREAN SECTION    . CHOLECYSTECTOMY       OB History   None      Home Medications    Prior to Admission medications   Medication Sig Start Date End Date Taking? Authorizing Provider  allopurinol (ZYLOPRIM) 300 MG tablet Take 1 tablet (300 mg total) by mouth daily. 11/14/17   [name], MD  alum & mag hydroxide-simeth (MAALOX MAX) 400-400-40 MG/5ML suspension Take 10 mLs by mouth every 6 (six) hours as needed for indigestion (chest pain). Patient not taking: Reported on 09/19/2017 10/01/16   Molt, Bethany, DO  aspirin EC 81  MG tablet Take 81 mg by mouth daily.    [provider]  atorvastatin (LIPITOR) 20 MG tablet Take 1 tablet (20 mg total) by mouth daily. 11/14/17   [name], MD  azithromycin (ZITHROMAX Z-PAK) 250 MG tablet 2 po day one, then 1 daily x 4 days 11/23/17   Domenic Moras, PA-C  Blood Glucose Monitoring Suppl (ONE TOUCH ULTRA MINI) w/Device KIT UAD UP TO THREE TIMES DAILY  DX E11.4 11/20/17   [name], MD  carvedilol (COREG) 12.5 MG tablet Take 1 tablet (12.5 mg total) by mouth 2 (two) times daily with a meal. 11/14/17   [name], MD  furosemide (LASIX) 40 MG tablet Take 1 tablet (40 mg total) by mouth daily. 11/14/17   [name], MD  gabapentin (NEURONTIN) 300 MG capsule Take 2 capsules (600 mg total) by mouth 2 (two) times daily. 11/14/17   [name], MD  glucose blood (ONE TOUCH ULTRA TEST) test strip UAD UP TO THREE TIMES DAILY DX E11.4 11/20/17   [name], MD  ibuprofen (ADVIL,MOTRIN) 200 MG tablet Take 200-600 mg by mouth every 6 (six) hours as needed for headache, mild pain or moderate pain.     [provider]  insulin aspart (NOVOLOG) 100 UNIT/ML injection 0 to 12 units subcutaneously 3 times daily before meals as per sliding scale Patient taking differently: Inject 0-12 Units into the skin 3 (three) times daily with meals. per sliding scale 11/14/17   [name], MD  Insulin Glargine (BASAGLAR KWIKPEN) 100 UNIT/ML SOPN Inject 35 units into the skin daily. 11/21/17   [name], MD  Lancet Device MISC Use as instructed 3 times daily 09/19/17   Rodell Perna A, PA-C  lansoprazole (PREVACID) 15 MG capsule Take 15 mg by mouth daily.    [provider]  losartan (COZAAR) 50 MG tablet Take 1 tablet (50 mg total) by mouth daily. 09/28/17   Argentina Donovan, PA-C  meclizine (ANTIVERT) 25 MG tablet Take 1 tablet (25 mg total) by mouth 2 (two) times daily as needed. Patient taking differently: Take 25 mg by mouth 2 (two) times daily as needed (vertigo).  05/06/16   [name], MD  metoCLOPramide (REGLAN) 5 MG tablet Take 1 tablet (5 mg total) by mouth 3 (three) times daily before meals. 11/14/17   [name], MD  Multiple Vitamin (MULTIVITAMIN WITH MINERALS) TABS tablet Take 1 tablet by mouth daily. [employer], research (physical sciences), Historical, MD  ONETOUCH DELICA LANCETS 15X MISC UAD UP TO THREE TIMES DAILY DX  E11.4 11/20/17   [name], MD  pantoprazole (PROTONIX) 40 MG tablet Take 1 tablet (40 mg total) by mouth 2 (two) times daily. Patient not taking: Reported on 11/23/2017 10/01/16   Molt, Bethany, DO  tiZANidine (ZANAFLEX) 4 MG tablet Take 1 tablet (4 mg total) by mouth every 8 (eight) hours as needed for muscle spasms. 11/14/17   [name], MD    Family History Family History  Problem Relation Age of Onset  . Diabetes Mellitus II Father   . Stroke Father     Social History Social History   Tobacco Use  . Smoking status: Never Smoker  . Smokeless tobacco: Never Used  Substance Use Topics  . Alcohol use: No  . Drug use: No     Allergies   Garlic; Latex; Morphine and related; and Other   Review of Systems Review of Systems  All other systems reviewed and are negative.  Physical Exam Updated Vital Signs BP (!) 186/59   Pulse 81   Temp 98.1 F (36.7 C)   Resp 20   Ht 4' 11" (1.499 m)   Wt 77.4 kg   SpO2 99%   BMI 34.46 kg/m   Physical Exam  Constitutional: She is oriented to person, place, and time. She appears well-developed and well-nourished.  Non-toxic appearance. No distress.  HENT:  Head: Normocephalic and atraumatic.  Nose: Nose normal.  Mouth/Throat: Oropharynx is clear and moist.  Eyes: Pupils are equal, round, and reactive to light. Conjunctivae are normal. Right eye exhibits no discharge. Left eye exhibits no discharge.  Neck: Normal range of motion. Neck supple. No JVD present. No tracheal deviation present.  Cardiovascular: Normal rate, regular rhythm, normal heart sounds and intact distal pulses.  Pulmonary/Chest: Effort normal. Tachypnea noted. No respiratory distress. She has decreased breath sounds. She has no wheezes. She exhibits no tenderness.  Crackles noted the base of both lungs.  Abdominal: Soft. Bowel sounds are normal. She exhibits no distension. There is no tenderness. There is no rebound and no guarding.  Musculoskeletal:  Normal range of motion.  No calf tenderness.  Does have a pitting edema 1+ to the levels of the shin..  Lymphadenopathy:    She has no cervical adenopathy.  Neurological: She is alert and oriented to person, place, and time.  Skin: Skin is warm and dry. Capillary refill takes less than 2 seconds. She is not diaphoretic.  Psychiatric: Her behavior is normal. Judgment and thought content normal.  Nursing note and vitals reviewed.    ED Treatments / Results  Labs (all labs ordered are listed, but only abnormal results are displayed) Labs Reviewed  BASIC METABOLIC PANEL - Abnormal; Notable for the following components:      Result Value   Glucose, Bld 362 (*)    BUN 35 (*)    Creatinine, Ser 1.39 (*)    GFR calc non Af Amer 40 (*)    GFR calc Af Amer 46 (*)    All other components within normal limits  CBC - Abnormal; Notable for the following components:   Hemoglobin 11.4 (*)    HCT 34.5 (*)    All other components within normal limits  D-DIMER, QUANTITATIVE (NOT AT [facility]) - Abnormal; Notable for the following components:   D-Dimer, Quant 0.68 (*)    All other components within normal limits  BRAIN NATRIURETIC PEPTIDE  CBC WITH DIFFERENTIAL/PLATELET  I-STAT TROPONIN, ED    EKG EKG Interpretation  Date/Time:  Monday November 27 2017 00:45:28 EDT Ventricular Rate:  79 PR Interval:    QRS Duration: 96 QT Interval:  436 QTC Calculation: 500 R Axis:   38 Text Interpretation:  Sinus rhythm Borderline repolarization abnormality Borderline prolonged QT interval No acute changes No significant change since last tracing t wave abnormality in lateral leads is not new Confirmed by Varney Biles [id number]) on 11/27/2017 1:25:28 AM   Radiology Dg Chest 2 View  Result Date: 11/27/2017 CLINICAL DATA:  Shortness of breath EXAM: CHEST - 2 VIEW COMPARISON:  11/22/2017 FINDINGS: Mild cardiomegaly and vascular congestion. Bibasilar atelectasis or infiltrates. No significant effusions or acute  bony abnormality. IMPRESSION: Cardiomegaly with vascular congestion. Bibasilar atelectasis or infiltrates. Findings similar to prior study. Electronically Signed   By: Rolm Baptise M.D.   On: 11/27/2017 01:31   Ct Angio Chest Pe W/cm &/or Wo Cm  Result Date: 11/27/2017 CLINICAL DATA:  62 y/o F; chest tightness, shortness of breath,  dry cough. PE suspected, intermediate probability, positive D-dimer. EXAM: CT ANGIOGRAPHY CHEST WITH CONTRAST TECHNIQUE: Multidetector CT imaging of the chest was performed using the standard protocol during bolus administration of intravenous contrast. Multiplanar CT image reconstructions and MIPs were obtained to evaluate the vascular anatomy. CONTRAST:  69 cc Isovue 370 COMPARISON:  11/27/2017 chest radiograph.  09/30/2016 CT chest. FINDINGS: Cardiovascular: Stable cardiomegaly. No pericardial effusion. Normal caliber thoracic aorta and main pulmonary artery. Mild aortic and moderate coronary artery calcific atherosclerosis. Satisfactory opacification of the pulmonary arteries. Mediastinum/Nodes: No enlarged mediastinal, hilar, or axillary lymph nodes. Thyroid gland, trachea, and esophagus demonstrate no significant findings. Lungs/Pleura: Interlobular septal thickening in the lung bases. Small right pleural effusion. Small consolidation within the dependent right posterior lower lobe. Upper Abdomen: No acute abnormality. Musculoskeletal: No chest wall abnormality. No acute or significant osseous findings. Review of the MIP images confirms the above findings. IMPRESSION: 1. Mild interstitial pulmonary edema and small right pleural effusion. 2. Small consolidation within the dependent right lower lobe is probably atelectasis, less likely pneumonia. 3. Stable cardiomegaly. 4. Mild aortic and moderate coronary artery calcific atherosclerosis. 5. No pulmonary embolus identified. Electronically Signed   By: Kristine Garbe M.D.   On: 11/27/2017 05:18    Procedures .Critical  Care Performed by: Doristine Devoid, PA-C Authorized by: Doristine Devoid, PA-C   Critical care provider statement:    Critical care time (minutes):  45   Critical care was necessary to treat or prevent imminent or life-threatening deterioration of the following conditions: chf requiring bipap.   Critical care was time spent personally by me on the following activities:  Discussions with consultants, evaluation of patient's response to treatment, examination of patient, ordering and performing treatments and interventions, ordering and review of laboratory studies, ordering and review of radiographic studies, pulse oximetry, re-evaluation of patient's condition, obtaining history from patient or surrogate, review of old charts and development of treatment plan with patient or surrogate   (including critical care time)  Medications Ordered in ED Medications  furosemide (LASIX) injection 20 mg (has no administration in time range)  furosemide (LASIX) injection 40 mg (has no administration in time range)  albuterol (PROVENTIL) (2.5 MG/3ML) 0.083% nebulizer solution 2.5 mg (has no administration in time range)  albuterol (PROVENTIL) (2.5 MG/3ML) 0.083% nebulizer solution 5 mg (5 mg Nebulization Given 11/27/17 0051)  midazolam (VERSED) injection 1 mg (1 mg Intravenous Given 11/27/17 0305)  iopamidol (ISOVUE-370) 76 % injection 100 mL (100 mLs Intravenous Contrast Given 11/27/17 0431)     Initial Impression / Assessment and Plan / ED Course  I have reviewed the triage vital signs and the nursing notes.  Pertinent labs & imaging results that were available during my care of the patient were reviewed by me and considered in my medical decision making (see chart for details).     Patient presents to the emergency department today for ongoing shortness of breath and increased work of breathing.  Was seen several days ago and treated for pneumonia with azithromycin.  Patient states her symptoms  are not improving.  Reports a nonproductive cough.  Reports that her symptoms are worse at night when she lies down to go to sleep.   On initial examination patient was satting 100% on room air.  She was afebrile.  No significant tachycardia was noted.  Patient was tachypneic and had diminished breath sounds throughout.  Crackles noted the base of both lungs.  Patient had mild increased work of breathing.  Lab  work shows no leukocytosis.  Normal BNP.  Troponin was negative.  No significant electrolyte derangement.  Mild elevation in creatinine.  Glucose was elevated.  D-dimer was slightly elevated 0.68.  CT of chest shows cardiomegaly and vascular congestion with stable atelectasis versus infiltrates at the base of both lungs.  EKG shows sinus rhythm with some nonspecific T wave changes.  Borderline prolonged QT.  Given patient's tachypnea suggestive of CHF exacerbation BiPAP was ordered.  Patient seemed to have difficulty tolerating the mask in the room.  Versed was given for anxiety.  This initially helped however patient continued to complain that the machine was not helping her breathe and that the mask was making her feel claustrophobic.  The BiPAP was discontinued.  Patient still remains slightly tachypneic.  She did desat to 70% while sleeping in the room.  This is likely from underlying sleep apnea.  Subsequent CTA of chest was ordered to rule out PE which was normal.  Does show pulmonary edema and cardiomegaly with a small right pleural effusion.  Patient given Lasix.  Placed on 2 L of oxygen at this time.  Sleeping in the room and appears to be in no acute distress.  Patient will need further admission for work-up.  Spoke with Dr. Donna Bernard with hospital medicine who agrees to admission will see patient in the ED and place admission orders.  She was also seen by my attending who is agreed with the above plan.  Patient and family updated on plan of care.  Patient remains hemodynamic stable this  time.  Final Clinical Impressions(s) / ED Diagnoses   Final diagnoses:  Shortness of breath    ED Discharge Orders    None       Doristine Devoid, PA-C 11/27/17 [place], Ankit, MD 11/27/17 [phone]

## 2017-11-28 ENCOUNTER — Encounter (HOSPITAL_COMMUNITY): Payer: Self-pay | Admitting: *Deleted

## 2017-11-28 ENCOUNTER — Ambulatory Visit: Payer: Medicare HMO | Admitting: Pharmacist

## 2017-11-28 DIAGNOSIS — Z794 Long term (current) use of insulin: Secondary | ICD-10-CM

## 2017-11-28 DIAGNOSIS — E119 Type 2 diabetes mellitus without complications: Secondary | ICD-10-CM

## 2017-11-28 DIAGNOSIS — J9621 Acute and chronic respiratory failure with hypoxia: Secondary | ICD-10-CM

## 2017-11-28 LAB — BASIC METABOLIC PANEL
ANION GAP: 7 (ref 5–15)
BUN: 33 mg/dL — AB (ref 8–23)
CHLORIDE: 105 mmol/L (ref 98–111)
CO2: 27 mmol/L (ref 22–32)
Calcium: 9.1 mg/dL (ref 8.9–10.3)
Creatinine, Ser: 1.52 mg/dL — ABNORMAL HIGH (ref 0.44–1.00)
GFR calc Af Amer: 41 mL/min — ABNORMAL LOW (ref >60)
GFR, EST NON AFRICAN AMERICAN: 36 mL/min — AB (ref >60)
Glucose, Bld: 187 mg/dL — ABNORMAL HIGH (ref 70–99)
POTASSIUM: 4.1 mmol/L (ref 3.5–5.1)
Sodium: 139 mmol/L (ref 135–145)

## 2017-11-28 LAB — GLUCOSE, CAPILLARY
GLUCOSE-CAPILLARY: 183 mg/dL — AB (ref 70–99)
GLUCOSE-CAPILLARY: 206 mg/dL — AB (ref 70–99)
Glucose-Capillary: 144 mg/dL — ABNORMAL HIGH (ref 70–99)
Glucose-Capillary: 238 mg/dL — ABNORMAL HIGH (ref 70–99)

## 2017-11-28 MED ORDER — FUROSEMIDE 10 MG/ML IJ SOLN
60.0000 mg | Freq: Three times a day (TID) | INTRAMUSCULAR | Status: DC
  Administered 2017-11-28 – 2017-11-29 (×5): 60 mg via INTRAVENOUS
  Filled 2017-11-28 (×5): qty 6

## 2017-11-28 NOTE — Plan of Care (Signed)
Discussed plan of care for the evening with patient.  Emphasized using the call button when assistance is needed.  Good teach back displayed. 

## 2017-11-28 NOTE — Progress Notes (Signed)
Nutrition Brief Note  Patient identified on the Malnutrition Screening Tool (MST) Report.  Wt Readings from Last 15 Encounters:  11/27/17 79.1 kg  11/14/17 77.4 kg  09/28/17 74.8 kg  10/01/16 79.7 kg  06/18/16 79.2 kg  05/19/16 75.8 kg  05/06/16 74.8 kg  04/25/16 74.7 kg  02/04/16 76 kg  12/05/15 68.9 kg  11/12/15 72.1 kg   Body mass index is 35.22 kg/m. Patient meets criteria for Obesity Class II based on current BMI.   Current diet order is Heart Healthy/Carbohydrate Modified, patient is consuming approximately 100% of meals at this time. Labs and medications reviewed.   No nutrition interventions warranted at this time. If nutrition issues arise, please consult RD.   Arthur Holms, RD, LDN Pager #: [phone] After-Hours Pager #: [phone]

## 2017-11-28 NOTE — Hospital Discharge Follow-Up (Signed)
The patient is known to the [facility]. Met with her this afternoon.  He currently lives in an apartment with her daughter but is looking for her own apartment because the bedrooms and bathroom are on the second floor of her daughter's apartment.   She receives social security survivor benefits and said that she is able to pay about $500/month rent. Provided her with the information about [employer] that her daughter can access to locate available apartments.  Her daughter does not drive and they rely on busses and cabs for transportation. She is interested in [facility] and this information was shared with the CM  She currently has her medications filled at [facility] but is in the process of transferring her prescriptions to [facility] because they will deliver. She reported no problems affording her medications noting her copays are $0-1.25.  An appointment was scheduled at [facility] for 12/13/17 @1410  and this information was placed on the AVS.   Voicemail message with an update was left for Tomi Bamberger, RN CM

## 2017-11-28 NOTE — Progress Notes (Signed)
Inpatient Diabetes Program Recommendations  AACE/ADA: New Consensus Statement on Inpatient Glycemic Control (2015)  Target Ranges:  Prepandial:   less than 140 mg/dL      Peak postprandial:   less than 180 mg/dL (1-2 hours)      Critically ill patients:  140 - 180 mg/dL   Lab Results  Component Value Date   GLUCAP 238 (H) 11/28/2017   HGBA1C 9.6 (H) 11/27/2017    Review of Glycemic Control Results for Kara Hanson, Kara Hanson (MRN [id number]) as of 11/28/2017 14:04  Ref. Range 11/27/2017 21:15 11/28/2017 07:21 11/28/2017 12:20  Glucose-Capillary Latest Ref Range: 70 - 99 mg/dL 318 (H) 144 (H) 238 (H)   Diabetes history:  Type 2 DM Outpatient Diabetes medications: Novolog 0-12 units TID, Lantus 35 units QHS Current orders for Inpatient glycemic control: Lantus 35 units QHS, Novolog 0-15 units TID, Novolog 0-5 units QHS  Inpatient Diabetes Program Recommendations:    If post prandials continue to exceed 180 mg/dL, consider adding Novolog 3 units TID (assuming that patient is consuming >50% of meal).  Thanks, Bronson Curb, MSN, RNC-OB Diabetes Coordinator [phone] (8a-5p)

## 2017-11-28 NOTE — Progress Notes (Signed)
[facility] PROGRESS NOTE  Akiva Josey BDZ:[id number] DOB: [birth year] DOA: 11/27/2017  PCP: [name], MD  Brief History/Interval Summary: 62 y.o. female with medical history significant for chronic diastolic heart failure, dm, htn, mi, stroke presented to the emergency department with chief complaint shortness of breath. Initial evaluation reveals acute respiratory distress likely related to acute on chronic diastolic heart failure in the setting of resolving community-acquired pneumonia. She was placed on BiPAP due to increased work of breathing and provided with Lasix 60 mg IV.  She was quickly weaned off of this.  Reason for Visit: Acute diastolic CHF  Consultants: None  Procedures:   TransThoracic echocardiogram Study Conclusions  - Left ventricle: The cavity size was normal. Wall thickness was   increased in a pattern of mild LVH. Systolic function was normal.   The estimated ejection fraction was in the range of 55% to 60%.   Wall motion was normal; there were no regional wall motion   abnormalities. Doppler parameters are consistent with abnormal   left ventricular relaxation (grade 1 diastolic dysfunction). The   E/e&' ratio is >15, suggesting elevated LV filling pressure. - Aortic valve: Poorly visualized. Mildly calcified leaflets. Mild   stenosis. Mean gradient (S): 10 mm Hg. Peak gradient (S): 16 mm   Hg. Valve area (VTI): 1.46 cm^2. Valve area (Vmax): 1.34 cm^2.   Valve area (Vmean): 1.4 cm^2. - Mitral valve: Mildly thickened leaflets . There was trivial   regurgitation. - Left atrium: The atrium was normal in size. - Inferior vena cava: The vessel was normal in size. The   respirophasic diameter changes were in the normal range (>= 50%),   consistent with normal central venous pressure.  Impressions:  - Compared to a prior study in 06/2016, the LVEF is unchanged. There   is diastolic dysfunction with elevated LV filling pressure. There   is  likely mild aortic stenosis with mean gradient of 10 mmHg.   Antibiotics: None  Subjective/Interval History: Patient states that she continues to have shortness of breath.  Denies any chest pain.  Has not put out much urine since yesterday.  Leg edema has improved slightly.  ROS: Denies any nausea or vomiting  Objective:  Vital Signs  Vitals:   11/27/17 1443 11/27/17 1729 11/27/17 2300 11/28/17 0725  BP:  (!) 142/63 (!) 151/65 (!) 152/75  Pulse:  78 74 85  Resp: 20 (!) 22 18 (!) 22  Temp:  97.6 F (36.4 C) 98 F (36.7 C) 98.4 F (36.9 C)  TempSrc:  Axillary Oral Oral  SpO2:  99% 98% 95%  Weight:      Height:        Intake/Output Summary (Last 24 hours) at 11/28/2017 1216 Last data filed at 11/28/2017 0840 Gross per 24 hour  Intake 360 ml  Output -  Net 360 ml   Filed Weights   11/27/17 0046 11/27/17 0900  Weight: 77.4 kg 79.1 kg    General appearance: alert, cooperative, appears stated age and no distress Head: Normocephalic, without obvious abnormality, atraumatic Resp: Mildly tachypneic at rest.  No use of accessory muscles.  Crackles noted bilateral bases.  No wheezing or rhonchi. Cardio: regular rate and rhythm, S1, S2 normal, no murmur, click, rub or gallop GI: soft, non-tender; bowel sounds normal; no masses,  no organomegaly Extremities: extremities normal, atraumatic, no cyanosis or edema Neurologic: No focal neurological deficits.  Lab Results:  Data Reviewed: I have personally reviewed following labs and imaging studies  CBC: Recent  Labs  Lab 11/22/17 2330 11/27/17 0054  WBC 6.4 5.8  NEUTROABS 4.7  --   HGB 11.4* 11.4*  HCT 34.9* 34.5*  MCV 87.3 86.9  PLT 251 937    Basic Metabolic Panel: Recent Labs  Lab 11/22/17 2330 11/27/17 0054 11/28/17 0326  NA 137 137 139  K 3.7 3.7 4.1  CL 107 105 105  CO2 23 23 27   GLUCOSE 238* 362* 187*  BUN 25* 35* 33*  CREATININE 0.93 1.39* 1.52*  CALCIUM 9.0 9.2 9.1    GFR: Estimated Creatinine  Clearance: 34.9 mL/min (A) (by C-G formula based on SCr of 1.52 mg/dL (H)).  Liver Function Tests: Recent Labs  Lab 11/22/17 2330  AST 15  ALT 16  ALKPHOS 57  BILITOT 0.5  PROT 5.5*  ALBUMIN 2.7*    Cardiac Enzymes: Recent Labs  Lab 11/22/17 2330  TROPONINI <0.03    HbA1C: Recent Labs    11/27/17 0723  HGBA1C 9.6*    CBG: Recent Labs  Lab 11/27/17 0911 11/27/17 1311 11/27/17 1727 11/27/17 2115 11/28/17 0721  GLUCAP 266* 313* 117* 318* 144*      Radiology Studies: Dg Chest 2 View  Result Date: 11/27/2017 CLINICAL DATA:  Shortness of breath EXAM: CHEST - 2 VIEW COMPARISON:  11/22/2017 FINDINGS: Mild cardiomegaly and vascular congestion. Bibasilar atelectasis or infiltrates. No significant effusions or acute bony abnormality. IMPRESSION: Cardiomegaly with vascular congestion. Bibasilar atelectasis or infiltrates. Findings similar to prior study. Electronically Signed   By: Rolm Baptise M.D.   On: 11/27/2017 01:31   Ct Angio Chest Pe W/cm &/or Wo Cm  Result Date: 11/27/2017 CLINICAL DATA:  62 y/o F; chest tightness, shortness of breath, dry cough. PE suspected, intermediate probability, positive D-dimer. EXAM: CT ANGIOGRAPHY CHEST WITH CONTRAST TECHNIQUE: Multidetector CT imaging of the chest was performed using the standard protocol during bolus administration of intravenous contrast. Multiplanar CT image reconstructions and MIPs were obtained to evaluate the vascular anatomy. CONTRAST:  69 cc Isovue 370 COMPARISON:  11/27/2017 chest radiograph.  09/30/2016 CT chest. FINDINGS: Cardiovascular: Stable cardiomegaly. No pericardial effusion. Normal caliber thoracic aorta and main pulmonary artery. Mild aortic and moderate coronary artery calcific atherosclerosis. Satisfactory opacification of the pulmonary arteries. Mediastinum/Nodes: No enlarged mediastinal, hilar, or axillary lymph nodes. Thyroid gland, trachea, and esophagus demonstrate no significant findings.  Lungs/Pleura: Interlobular septal thickening in the lung bases. Small right pleural effusion. Small consolidation within the dependent right posterior lower lobe. Upper Abdomen: No acute abnormality. Musculoskeletal: No chest wall abnormality. No acute or significant osseous findings. Review of the MIP images confirms the above findings. IMPRESSION: 1. Mild interstitial pulmonary edema and small right pleural effusion. 2. Small consolidation within the dependent right lower lobe is probably atelectasis, less likely pneumonia. 3. Stable cardiomegaly. 4. Mild aortic and moderate coronary artery calcific atherosclerosis. 5. No pulmonary embolus identified. Electronically Signed   By: Kristine Garbe M.D.   On: 11/27/2017 05:18     Medications:  Scheduled: . allopurinol  300 mg Oral Daily  . aspirin EC  81 mg Oral Daily  . atorvastatin  20 mg Oral Daily  . carvedilol  12.5 mg Oral BID WC  . enoxaparin (LOVENOX) injection  40 mg Subcutaneous Q24H  . furosemide  60 mg Intravenous Q8H  . gabapentin  600 mg Oral BID  . insulin aspart  0-15 Units Subcutaneous TID WC  . insulin aspart  0-5 Units Subcutaneous QHS  . insulin glargine  35 Units Subcutaneous Q2200  . metoCLOPramide  5 mg Oral TID AC  . pantoprazole  20 mg Oral Daily  . sodium chloride flush  3 mL Intravenous Q12H   Continuous: . sodium chloride     BPZ:WCHENI chloride, albuterol, meclizine, ondansetron (ZOFRAN) IV, sodium chloride flush, tiZANidine  Assessment/Plan:    Acute respiratory failure with hypoxia This is likely secondary to acute on chronic diastolic CHF.  She also recently completed a course of azithromycin for community-acquired pneumonia.  She required BiPAP initially but was quickly weaned off of it.  Her respiratory status appears to be stable.  Continue to treat CHF.  Continue to monitor oxygen saturations.  Acute on chronic diastolic CHF Echocardiogram report as above.  No systolic dysfunction noted.   Mild aortic stenosis was mentioned.  Patient has not put out much urine since yesterday.  No ins and outs have been charted.  Her weight is higher than yesterday.  We will increase the dose of Lasix.  Strict ins and outs and daily weights.  Monitor electrolytes and renal function closely.  Continue carvedilol.  EKG with nonspecific changes.  Troponin was normal.  Acute kidney injury Creatinine was 1.3 admission.  Creatinine is usually within normal limits.  Creatinine this morning is 1.52.  She has not put out much urine.  We will increase the dose of Lasix.  Avoid nephrotoxins.  Hold her ARB.  Insulin-dependent diabetes mellitus HbA1c 9.6.  Improved from 13.6 about 2 months ago.  Continue Lantus SSI.  Monitor CBGs.  Essential hypertension Monitor blood pressures closely.    Hyperlipidemia Continue statin   DVT Prophylaxis: Lovenox    Code Status: Full code Family Communication: Discussed with the patient Disposition Plan: Management as outlined above.  Await improvement in CHF.  Continues to require high-dose intravenous diuretics.    LOS: 1 day   [facility] Pager [phone] 11/28/2017, 12:16 PM  If 7PM-7AM, please contact night-coverage at [email or web address], password [facility]

## 2017-11-29 ENCOUNTER — Inpatient Hospital Stay (HOSPITAL_COMMUNITY): Payer: Medicare HMO

## 2017-11-29 DIAGNOSIS — R0602 Shortness of breath: Secondary | ICD-10-CM

## 2017-11-29 DIAGNOSIS — R0603 Acute respiratory distress: Secondary | ICD-10-CM

## 2017-11-29 DIAGNOSIS — N179 Acute kidney failure, unspecified: Secondary | ICD-10-CM

## 2017-11-29 DIAGNOSIS — E1165 Type 2 diabetes mellitus with hyperglycemia: Secondary | ICD-10-CM

## 2017-11-29 DIAGNOSIS — I5033 Acute on chronic diastolic (congestive) heart failure: Secondary | ICD-10-CM

## 2017-11-29 DIAGNOSIS — I10 Essential (primary) hypertension: Secondary | ICD-10-CM

## 2017-11-29 LAB — BASIC METABOLIC PANEL
Anion gap: 9 (ref 5–15)
BUN: 39 mg/dL — ABNORMAL HIGH (ref 8–23)
CO2: 27 mmol/L (ref 22–32)
CREATININE: 1.44 mg/dL — AB (ref 0.44–1.00)
Calcium: 8.9 mg/dL (ref 8.9–10.3)
Chloride: 98 mmol/L (ref 98–111)
GFR calc Af Amer: 44 mL/min — ABNORMAL LOW (ref >60)
GFR calc non Af Amer: 38 mL/min — ABNORMAL LOW (ref >60)
GLUCOSE: 251 mg/dL — AB (ref 70–99)
Potassium: 3.7 mmol/L (ref 3.5–5.1)
SODIUM: 134 mmol/L — AB (ref 135–145)

## 2017-11-29 LAB — CBC
HCT: 34.7 % — ABNORMAL LOW (ref 36.0–46.0)
Hemoglobin: 11.4 g/dL — ABNORMAL LOW (ref 12.0–15.0)
MCH: 28.4 pg (ref 26.0–34.0)
MCHC: 32.9 g/dL (ref 30.0–36.0)
MCV: 86.5 fL (ref 78.0–100.0)
PLATELETS: 239 10*3/uL (ref 150–400)
RBC: 4.01 MIL/uL (ref 3.87–5.11)
RDW: 13.1 % (ref 11.5–15.5)
WBC: 6 10*3/uL (ref 4.0–10.5)

## 2017-11-29 LAB — GLUCOSE, CAPILLARY
Glucose-Capillary: 244 mg/dL — ABNORMAL HIGH (ref 70–99)
Glucose-Capillary: 186 mg/dL — ABNORMAL HIGH (ref 70–99)
Glucose-Capillary: 197 mg/dL — ABNORMAL HIGH (ref 70–99)
Glucose-Capillary: 277 mg/dL — ABNORMAL HIGH (ref 70–99)

## 2017-11-29 MED ORDER — INSULIN GLARGINE 100 UNIT/ML ~~LOC~~ SOLN
38.0000 [IU] | Freq: Every day | SUBCUTANEOUS | Status: DC
  Administered 2017-11-29: 38 [IU] via SUBCUTANEOUS
  Filled 2017-11-29: qty 0.38

## 2017-11-29 MED ORDER — INSULIN ASPART 100 UNIT/ML ~~LOC~~ SOLN
3.0000 [IU] | Freq: Three times a day (TID) | SUBCUTANEOUS | Status: DC
  Administered 2017-11-29 – 2017-11-30 (×3): 3 [IU] via SUBCUTANEOUS

## 2017-11-29 NOTE — Progress Notes (Signed)
PROGRESS NOTE  Kara Hanson OEU:[id number] DOB: [birth year] DOA: 11/27/2017 PCP: [name], MD  HPI/Recap of past 24 hours: 62 y.o.femalewith medical history significantfor chronic diastolic heart failure, dm, htn, mi, stroke presented to the emergency department with chief complaint shortness of breath. Initial evaluation reveals acute respiratory distress likely related to acute on chronic diastolic heart failure in the setting of resolving community-acquired pneumonia. She was placed on BiPAP due to increased work of breathing and provided with Lasix 60 mg IV. She was quickly weaned off of this.   11/29/2017: Patient seen and examined at her bedside.  She reports her dyspnea is improved with breathing treatments and IV Lasix.  She denies chest pain or palpitations.  Admits to minimal urine output on 60 mg of IV Lasix 3 times daily.  700 cc recorded in the last 24 hours  Assessment/Plan: Principal Problem:   Acute respiratory distress Active Problems:   Controlled type 2 diabetes mellitus with hyperglycemia (HCC)   Hypertension   HLD (hyperlipidemia)   Acute on chronic diastolic CHF (congestive heart failure) (HCC)   Acute kidney injury ([place])  Acute hypoxic respiratory failure, resolving Suspect secondary to acute on chronic diastolic CHF Independently reviewed chest x-ray done on admission which revealed increase in pulmonary vascularity cardiomegaly with suspicion for mild pulmonary edema Initially on BiPAP now saturating well on room air Continue O2 supplementation as needed to maintain O2 saturation greater than 90% Continue breathing treatments  Acute on chronic diastolic CHF Last 2D echo done on 11/27/2017 revealed no changes from prior low normal ejection fraction and grade 2 diastolic dysfunction Continue Lasix 60 mg 3 times daily Continue to monitor urine output Strict I's and O's and daily weight Salt restriction Continue cardiac medications, on Coreg, Lipitor  and aspirin  AKI Baseline creatinine normal Creatinine on admission 1.3 from 0.9 Creatinine 1.5 yesterday Mild improvement with creatinine of 1.44 today, GFR 38 Avoid nephrotoxic agents/hypotension/dehydration Obtain renal ultrasound Repeat BMP in the morning  Mild hyponatremia Sodium 134 from 139 Could be dilutional Repeat BMP in the morning  Type 2 diabetes.  Uncontrolled hyperglycemia Hemoglobin A1c 9.6 Continue insulin sliding scale Increase insulin Lantus to 38 units nightly Add NovoLog 3 units 3 times daily  Gout No acute flare On allopurinol   Code Status: Full code  Family Communication: None at bedside  Disposition Plan: Possibly tomorrow to home 11/30/2017   Consultants:  None  Procedures:  None  Antimicrobials:  None  DVT prophylaxis: Subcu Lovenox daily   Objective: Vitals:   11/28/17 1652 11/28/17 2345 11/29/17 0600 11/29/17 0908  BP: (!) 143/57 (!) 144/57  113/64  Pulse: 75 76  92  Resp: (!) 24 19  18   Temp: 97.9 F (36.6 C) 97.8 F (36.6 C)  98.4 F (36.9 C)  TempSrc: Oral Oral  Oral  SpO2: 97% 94%  93%  Weight:   78.1 kg   Height:        Intake/Output Summary (Last 24 hours) at 11/29/2017 1124 Last data filed at 11/29/2017 0947 Gross per 24 hour  Intake 3 ml  Output 1600 ml  Net -1597 ml   Filed Weights   11/27/17 0046 11/27/17 0900 11/29/17 0600  Weight: 77.4 kg 79.1 kg 78.1 kg    Exam:  . General: 62 y.o. year-old female well developed well nourished in no acute distress.  Alert and oriented x3. . Cardiovascular: Regular rate and rhythm with no rubs or gallops.  No thyromegaly or JVD noted.   Marland Kitchen  Respiratory: Clear to auscultation with no wheezes.  Mild rales at bases.  Good inspiratory effort. . Abdomen: Soft nontender nondistended with normal bowel sounds x4 quadrants. . Musculoskeletal: No lower extremity edema. 2/4 pulses in all 4 extremities. . Skin: No ulcerative lesions noted or rashes, . Psychiatry: Mood is  appropriate for condition and setting   Data Reviewed: CBC: Recent Labs  Lab 11/22/17 2330 11/27/17 0054 11/29/17 0321  WBC 6.4 5.8 6.0  NEUTROABS 4.7  --   --   HGB 11.4* 11.4* 11.4*  HCT 34.9* 34.5* 34.7*  MCV 87.3 86.9 86.5  PLT 251 260 500   Basic Metabolic Panel: Recent Labs  Lab 11/22/17 2330 11/27/17 0054 11/28/17 0326 11/29/17 0321  NA 137 137 139 134*  K 3.7 3.7 4.1 3.7  CL 107 105 105 98  CO2 23 23 27 27   GLUCOSE 238* 362* 187* 251*  BUN 25* 35* 33* 39*  CREATININE 0.93 1.39* 1.52* 1.44*  CALCIUM 9.0 9.2 9.1 8.9   GFR: Estimated Creatinine Clearance: 36.6 mL/min (A) (by C-G formula based on SCr of 1.44 mg/dL (H)). Liver Function Tests: Recent Labs  Lab 11/22/17 2330  AST 15  ALT 16  ALKPHOS 57  BILITOT 0.5  PROT 5.5*  ALBUMIN 2.7*   No results for input(s): LIPASE, AMYLASE in the last 168 hours. No results for input(s): AMMONIA in the last 168 hours. Coagulation Profile: No results for input(s): INR, PROTIME in the last 168 hours. Cardiac Enzymes: Recent Labs  Lab 11/22/17 2330  TROPONINI <0.03   BNP (last 3 results) No results for input(s): PROBNP in the last 8760 hours. HbA1C: Recent Labs    11/27/17 0723  HGBA1C 9.6*   CBG: Recent Labs  Lab 11/28/17 0721 11/28/17 1220 11/28/17 1650 11/28/17 2124 11/29/17 0904  GLUCAP 144* 238* 183* 206* 244*   Lipid Profile: No results for input(s): CHOL, HDL, LDLCALC, TRIG, CHOLHDL, LDLDIRECT in the last 72 hours. Thyroid Function Tests: No results for input(s): TSH, T4TOTAL, FREET4, T3FREE, THYROIDAB in the last 72 hours. Anemia Panel: No results for input(s): VITAMINB12, FOLATE, FERRITIN, TIBC, IRON, RETICCTPCT in the last 72 hours. Urine analysis:    Component Value Date/Time   COLORURINE STRAW (A) 06/17/2016 1507   APPEARANCEUR CLEAR 06/17/2016 1507   LABSPEC 1.020 06/17/2016 1507   PHURINE 7.0 06/17/2016 1507   GLUCOSEU >=500 (A) 06/17/2016 1507   HGBUR NEGATIVE 06/17/2016 1507    BILIRUBINUR NEGATIVE 06/17/2016 1507   KETONESUR NEGATIVE 06/17/2016 1507   PROTEINUR 100 (A) 06/17/2016 1507   NITRITE NEGATIVE 06/17/2016 1507   LEUKOCYTESUR TRACE (A) 06/17/2016 1507   Sepsis Labs: @LABRCNTIP (procalcitonin:4,lacticidven:4)  )No results found for this or any previous visit (from the past 240 hour(s)).    Studies: No results found.  Scheduled Meds: . allopurinol  300 mg Oral Daily  . aspirin EC  81 mg Oral Daily  . atorvastatin  20 mg Oral Daily  . carvedilol  12.5 mg Oral BID WC  . enoxaparin (LOVENOX) injection  40 mg Subcutaneous Q24H  . furosemide  60 mg Intravenous Q8H  . gabapentin  600 mg Oral BID  . insulin aspart  0-15 Units Subcutaneous TID WC  . insulin aspart  0-5 Units Subcutaneous QHS  . insulin aspart  3 Units Subcutaneous TID WC  . insulin glargine  38 Units Subcutaneous Q2200  . metoCLOPramide  5 mg Oral TID AC  . pantoprazole  20 mg Oral Daily  . sodium chloride flush  3 mL Intravenous  Q12H    Continuous Infusions: . sodium chloride       LOS: 2 days     Kayleen Memos, MD [facility] Pager [phone]  If 7PM-7AM, please contact night-coverage [email or web address] Password [facility] 11/29/2017, 11:24 AM

## 2017-11-29 NOTE — Plan of Care (Signed)
Discussed plan of care for the evening with patient.  Emphasized using the call button when assistance is needed.  Good teach back displayed. 

## 2017-11-29 NOTE — Plan of Care (Signed)
Discussed plan of care for the evening with patient.  Stressed pain management and the importance of using the call button when assistance is needed.  Good teach back displayed.

## 2017-11-29 NOTE — Progress Notes (Signed)
Inpatient Diabetes Program Recommendations  AACE/ADA: New Consensus Statement on Inpatient Glycemic Control (2019)  Target Ranges:  Prepandial:   less than 140 mg/dL      Peak postprandial:   less than 180 mg/dL (1-2 hours)      Critically ill patients:  140 - 180 mg/dL   Results for Kara Hanson, Kara Hanson (MRN [id number]) as of 11/29/2017 09:30  Ref. Range 11/28/2017 07:21 11/28/2017 12:20 11/28/2017 16:50 11/28/2017 21:24 11/29/2017 09:04  Glucose-Capillary Latest Ref Range: 70 - 99 mg/dL 144 (H) 238 (H) 183 (H) 206 (H) 244 (H)   Review of Glycemic Control  Diabetes history: DM2 Outpatient Diabetes medications: Novolog 0-12 units TID, Lantus 35 units QHS Current orders for Inpatient glycemic control: Lantus 35 units QHS, Novolog 0-15 units TID with meals, Novolog 0-5 units QHS  Inpatient Diabetes Program Recommendations:  Insulin - Basal: Please consider increasing Lantus to 38 units QHS. Insulin - Meal Coverage: Please consider ordering Novolog 3 units TID with meals for meal coverage if patient eats at least 50% of meals.  Thanks,  Barnie Alderman, RN, MSN, CDE Diabetes Coordinator Inpatient Diabetes Program [phone] (Team Pager from 8am to 5pm)

## 2017-11-30 LAB — BASIC METABOLIC PANEL
Anion gap: 10 (ref 5–15)
BUN: 46 mg/dL — AB (ref 8–23)
CHLORIDE: 99 mmol/L (ref 98–111)
CO2: 26 mmol/L (ref 22–32)
Calcium: 9.2 mg/dL (ref 8.9–10.3)
Creatinine, Ser: 1.56 mg/dL — ABNORMAL HIGH (ref 0.44–1.00)
GFR calc Af Amer: 40 mL/min — ABNORMAL LOW (ref >60)
GFR, EST NON AFRICAN AMERICAN: 35 mL/min — AB (ref >60)
GLUCOSE: 173 mg/dL — AB (ref 70–99)
Potassium: 3.9 mmol/L (ref 3.5–5.1)
Sodium: 135 mmol/L (ref 135–145)

## 2017-11-30 LAB — GLUCOSE, CAPILLARY: Glucose-Capillary: 166 mg/dL — ABNORMAL HIGH (ref 70–99)

## 2017-11-30 LAB — TROPONIN I: Troponin I: <0.03 ng/mL (ref <0.03)

## 2017-11-30 MED ORDER — FUROSEMIDE 40 MG PO TABS
40.0000 mg | ORAL_TABLET | Freq: Every day | ORAL | Status: DC
  Administered 2017-11-30: 40 mg via ORAL
  Filled 2017-11-30: qty 1

## 2017-11-30 MED ORDER — NITROGLYCERIN 0.4 MG SL SUBL
0.4000 mg | SUBLINGUAL_TABLET | SUBLINGUAL | Status: DC | PRN
  Administered 2017-11-30: 0.4 mg via SUBLINGUAL
  Filled 2017-11-30: qty 1

## 2017-11-30 NOTE — Progress Notes (Signed)
..  Patient given all discharge paper work. All pages reviewed and subsequent questions answered. Patients telemetry and peripheral IV discontinued without complications. Patients belongings returned. Patient placed in wheel chair and escorted by staff member to pick up location.   -Rush Landmark

## 2017-11-30 NOTE — Discharge Instructions (Signed)
Shortness of Breath, Adult  Shortness of breath means you have trouble breathing. Your lungs are organs for breathing.  Follow these instructions at home:  Pay attention to any changes in your symptoms. Take these actions to help with your condition:  ? Do not smoke. Smoking can cause shortness of breath. If you need help to quit smoking, ask your doctor.  ? Avoid things that can make it harder to breathe, such as:  ? Mold.  ? Dust.  ? Air pollution.  ? Chemical smells.  ? Things that can cause allergy symptoms (allergens), if you have allergies.  ? Keep your living space clean and free of mold and dust.  ? Rest as needed. Slowly return to your usual activities.  ? Take over-the-counter and prescription medicines, including oxygen and inhaled medicines, only as told by your doctor.  ? Keep all follow-up visits as told by your doctor. This is important.  Contact a doctor if:  ? Your condition does not get better as soon as expected.  ? You have a hard time doing your normal activities, even after you rest.  ? You have new symptoms.  Get help right away if:  ? You have trouble breathing when you are resting.  ? You feel light-headed or you faint.  ? You have a cough that is not helped by medicines.  ? You cough up blood.  ? You have pain with breathing.  ? You have pain in your chest, arms, shoulders, or belly (abdomen).  ? You have a fever.  ? You cannot walk up stairs.  ? You cannot exercise the way you normally do.  This information is not intended to replace advice given to you by your health care provider. Make sure you discuss any questions you have with your health care provider.  Document Released: 09/07/2007 Document Revised: 04/07/2016 Document Reviewed: 04/07/2016  Elsevier Interactive Patient Education ? 2017 [employer]vier Inc[employer]

## 2017-11-30 NOTE — Progress Notes (Signed)
Dr. Nevada Crane paged the following at [phone]:  "2W21:Marines.neck pain. EKG complete NRS, T wave abnormality. Consistent with previous EKGs.BP stable. Nitro SL without resolve of neck pain."

## 2017-11-30 NOTE — Progress Notes (Signed)
PT Cancellation Note  Patient Details Name: Kara Hanson MRN: [id number] DOB: Jan 02, 1956   Cancelled Treatment:    Reason Eval/Treat Not Completed: Medical issues which prohibited therapy. Per RN, pt with active chest pain not relieved by nitro. MD paged. Getting EKG. Will follow-up for PT evaluation when appropriate.  Mabeline Caras, PT, DPT Acute Rehabilitation Services  Pager [phone] Office [place] 11/30/2017, 8:44 AM

## 2017-11-30 NOTE — Discharge Summary (Signed)
Discharge Summary  Kara Hanson ALP:[id number] DOB: 1955/07/03  PCP: [name], MD  Admit date: 11/27/2017 Discharge date: 11/30/2017  Time spent: 25 minutes  Recommendations for Outpatient Follow-up:  1. Follow-up with your PCP 2. Follow-up with your cardiologist 3. Take your medications as prescribed  Discharge Diagnoses:  Active Hospital Problems   Diagnosis Date Noted  . Acute respiratory distress 11/27/2017  . Acute on chronic diastolic CHF (congestive heart failure) ([place]) 11/27/2017  . Acute kidney injury ([place]) 11/27/2017  . HLD (hyperlipidemia) 09/30/2016  . Controlled type 2 diabetes mellitus with hyperglycemia ([place]) 04/23/2016  . Hypertension 04/23/2016    Resolved Hospital Problems  No resolved problems to display.    Discharge Condition: Stable  Diet recommendation: Resume previous diet  Vitals:   11/30/17 0826 11/30/17 0832  BP: 105/72 129/64  Pulse:    Resp:    Temp:    SpO2:      History of present illness:   62 y.o.femalewith medical history significantfor chronic diastolic heart failure, dm, htn, mi, stroke presentedto the emergency department with chief complaint shortness of breath. Initial evaluation reveals acute respiratory distress likely related to acute on chronic diastolic heart failure in the setting of resolving community-acquired pneumonia. She wasplaced on BiPAP due to increased work of breathing and provided with Lasix 60 mg IV. She was quickly weaned off of this.   11/29/2017: Patient seen and examined at her bedside.  She reports her dyspnea is improved with breathing treatments and IV Lasix.  She denies chest pain or palpitations.    11/30/2017: Patient seen and examined her bedside.  She has complaints of sharp pain behind her left ear.  Twelve-lead EKG and troponin ordered and unremarkable.  At the time of this dictation her symptoms were nearly resolved.  On the day of discharge the patient was hemodynamically stable.   She will need to follow-up with her primary care provider and cardiologist posthospitalization.   Hospital Course:  Principal Problem:   Acute respiratory distress Active Problems:   Controlled type 2 diabetes mellitus with hyperglycemia (HCC)   Hypertension   HLD (hyperlipidemia)   Acute on chronic diastolic CHF (congestive heart failure) (HCC)   Acute kidney injury ([place])  Acute hypoxic respiratory failure, resolved Suspect secondary to acute on chronic diastolic CHF Independently reviewed chest x-ray done on admission which revealed increase in pulmonary vascularity cardiomegaly with suspicion for mild pulmonary edema Initially on BiPAP now saturating well on room air  Acute on chronic diastolic CHF Last 2D echo done on 11/27/2017 revealed no changes from prior low normal ejection fraction and grade 2 diastolic dysfunction Continue home Lasix dose Continue salt restriction less than 2 g/day Continue cardiac medications, on Coreg, Lipitor and aspirin  AKI Renal ultrasound done on 11/29/2017 unremarkable Avoid nephrotoxic agents/hypotension/dehydration Follow-up with your PCP post hospitalization  Mild hyponatremia, resolved  Type 2 diabetes.  Uncontrolled hyperglycemia Hemoglobin A1c 9.6 Resume home insulin regimen Follow-up with your PCP posthospitalization  Gout No acute flare On allopurinol   Procedures:  None  Consultations:  None  Discharge Exam: BP 129/64   Pulse 77   Temp 98 F (36.7 C) (Oral)   Resp 17   Ht 4' 11"  (1.499 m)   Wt 78.1 kg   SpO2 95%   BMI 34.78 kg/m  . General: 62 y.o. year-old female well developed well nourished in no acute distress.  Alert and oriented x3. . Cardiovascular: Regular rate and rhythm with no rubs or gallops.  No thyromegaly or  JVD noted.   Marland Kitchen Respiratory: Clear to auscultation with no wheezes or rales. Good inspiratory effort. . Abdomen: Soft nontender nondistended with normal bowel sounds x4  quadrants. . Musculoskeletal: No lower extremity edema. 2/4 pulses in all 4 extremities. . Skin: No ulcerative lesions noted or rashes, . Psychiatry: Mood is appropriate for condition and setting  Discharge Instructions You were cared for by a hospitalist during your hospital stay. If you have any questions about your discharge medications or the care you received while you were in the hospital after you are discharged, you can call the unit and asked to speak with the hospitalist on call if the hospitalist that took care of you is not available. Once you are discharged, your primary care physician will handle any further medical issues. Please note that NO REFILLS for any discharge medications will be authorized once you are discharged, as it is imperative that you return to your primary care physician (or establish a relationship with a primary care physician if you do not have one) for your aftercare needs so that they can reassess your need for medications and monitor your lab values.   Allergies as of 11/30/2017      Reactions   Garlic Shortness Of Breath, Itching, Swelling   Hand itching and swelling   Latex Itching   Morphine And Related Itching, Other (See Comments)   Headache   Other Itching   Reaction to newspaper ink -itching and headache      Medication List    STOP taking these medications   azithromycin 250 MG tablet Commonly known as:  ZITHROMAX   ibuprofen 200 MG tablet Commonly known as:  ADVIL,MOTRIN   losartan 50 MG tablet Commonly known as:  COZAAR     TAKE these medications   allopurinol 300 MG tablet Commonly known as:  ZYLOPRIM Take 1 tablet (300 mg total) by mouth daily.   aspirin EC 81 MG tablet Take 81 mg by mouth daily.   atorvastatin 20 MG tablet Commonly known as:  LIPITOR Take 1 tablet (20 mg total) by mouth daily.   BASAGLAR KWIKPEN 100 UNIT/ML Sopn Inject 35 units into the skin daily.   carvedilol 12.5 MG tablet Commonly known as:   COREG Take 1 tablet (12.5 mg total) by mouth 2 (two) times daily with a meal.   furosemide 40 MG tablet Commonly known as:  LASIX Take 1 tablet (40 mg total) by mouth daily.   gabapentin 300 MG capsule Commonly known as:  NEURONTIN Take 2 capsules (600 mg total) by mouth 2 (two) times daily.   glucose blood test strip UAD UP TO THREE TIMES DAILY DX E11.4   insulin aspart 100 UNIT/ML injection Commonly known as:  novoLOG 0 to 12 units subcutaneously 3 times daily before meals as per sliding scale What changed:    how much to take  how to take this  when to take this  additional instructions   Lancet Device Misc Use as instructed 3 times daily   lansoprazole 15 MG capsule Commonly known as:  PREVACID Take 15 mg by mouth daily.   meclizine 25 MG tablet Commonly known as:  ANTIVERT Take 1 tablet (25 mg total) by mouth 2 (two) times daily as needed. What changed:  reasons to take this   metoCLOPramide 5 MG tablet Commonly known as:  REGLAN Take 1 tablet (5 mg total) by mouth 3 (three) times daily before meals.   multivitamin with minerals Tabs tablet Take 1 tablet by  mouth daily. Women's One a Day   ONE TOUCH ULTRA MINI w/Device Kit UAD UP TO THREE TIMES DAILY DX J18.8   ONETOUCH DELICA LANCETS 41Y Misc UAD UP TO THREE TIMES DAILY DX E11.4   tiZANidine 4 MG tablet Commonly known as:  ZANAFLEX Take 1 tablet (4 mg total) by mouth every 8 (eight) hours as needed for muscle spasms.      Allergies  Allergen Reactions  . Garlic Shortness Of Breath, Itching and Swelling    Hand itching and swelling  . Latex Itching  . Morphine And Related Itching and Other (See Comments)    Headache   . Other Itching    Reaction to newspaper ink -itching and headache   Follow-up [facility] Go on 12/13/2017.   Why:  at 2:10pm for an appointment with Dr Denita Lung information: [place]  [id number] [phone]       [name], MD. Call in 1 day(s).   Specialty:  Family Medicine Contact information: [place] [address] [phone]        Jerline Pain, MD. Call in 1 day(s).   Specialty:  Cardiology Why:  Please call for an appointment. Contact information: [address] [place] [address] [phone]            The results of significant diagnostics from this hospitalization (including imaging, microbiology, ancillary and laboratory) are listed below for reference.    Significant Diagnostic Studies: Dg Chest 2 View  Result Date: 11/27/2017 CLINICAL DATA:  Shortness of breath EXAM: CHEST - 2 VIEW COMPARISON:  11/22/2017 FINDINGS: Mild cardiomegaly and vascular congestion. Bibasilar atelectasis or infiltrates. No significant effusions or acute bony abnormality. IMPRESSION: Cardiomegaly with vascular congestion. Bibasilar atelectasis or infiltrates. Findings similar to prior study. Electronically Signed   By: Rolm Baptise M.D.   On: 11/27/2017 01:31   Dg Chest 2 View  Result Date: 11/22/2017 CLINICAL DATA:  Short of breath EXAM: CHEST - 2 VIEW COMPARISON:  09/19/2017, 09/30/2016, CT chest 09/30/2016 FINDINGS: Small pleural effusions. Mild cardiomegaly with vascular congestion. Diffuse increased interstitial opacity most likely representing pulmonary edema. Patchy airspace disease at the right base. IMPRESSION: 1. Mild cardiomegaly with vascular congestion, small pleural effusion and mild interstitial edema 2. Patchy airspace disease at the right base may reflect atelectasis or pneumonia Electronically Signed   By: Donavan Foil M.D.   On: 11/22/2017 23:14   Ct Angio Chest Pe W/cm &/or Wo Cm  Result Date: 11/27/2017 CLINICAL DATA:  62 y/o F; chest tightness, shortness of breath, dry cough. PE suspected, intermediate probability, positive D-dimer. EXAM: CT ANGIOGRAPHY CHEST WITH CONTRAST TECHNIQUE: Multidetector  CT imaging of the chest was performed using the standard protocol during bolus administration of intravenous contrast. Multiplanar CT image reconstructions and MIPs were obtained to evaluate the vascular anatomy. CONTRAST:  69 cc Isovue 370 COMPARISON:  11/27/2017 chest radiograph.  09/30/2016 CT chest. FINDINGS: Cardiovascular: Stable cardiomegaly. No pericardial effusion. Normal caliber thoracic aorta and main pulmonary artery. Mild aortic and moderate coronary artery calcific atherosclerosis. Satisfactory opacification of the pulmonary arteries. Mediastinum/Nodes: No enlarged mediastinal, hilar, or axillary lymph nodes. Thyroid gland, trachea, and esophagus demonstrate no significant findings. Lungs/Pleura: Interlobular septal thickening in the lung bases. Small right pleural effusion. Small consolidation within the dependent right posterior lower lobe. Upper Abdomen: No acute abnormality. Musculoskeletal: No chest wall abnormality. No acute or significant osseous findings. Review  of the MIP images confirms the above findings. IMPRESSION: 1. Mild interstitial pulmonary edema and small right pleural effusion. 2. Small consolidation within the dependent right lower lobe is probably atelectasis, less likely pneumonia. 3. Stable cardiomegaly. 4. Mild aortic and moderate coronary artery calcific atherosclerosis. 5. No pulmonary embolus identified. Electronically Signed   By: Kristine Garbe M.D.   On: 11/27/2017 05:18   US Renal  Result Date: 11/29/2017 CLINICAL DATA:  62 year old female with acute kidney injury. EXAM: RENAL / URINARY TRACT ULTRASOUND COMPLETE COMPARISON:  CTA chest 11/27/2017. CT Abdomen and Pelvis 09/30/2016. FINDINGS: Right Kidney: Length: 10.9 centimeters. Echogenicity within normal limits. No mass or hydronephrosis visualized. Left Kidney: Length: 11.6 centimeters. Echogenicity within normal limits. No mass or hydronephrosis visualized. Bladder: Appears normal for degree of bladder  distention. Other findings: Increased hepatic echogenicity (image 5). IMPRESSION: Normal ultrasound appearance of both kidneys and the urinary bladder. Electronically Signed   By: Genevie Ann M.D.   On: 11/29/2017 17:39    Microbiology: No results found for this or any previous visit (from the past 240 hour(s)).   Labs: Basic Metabolic Panel: Recent Labs  Lab 11/27/17 0054 11/28/17 0326 11/29/17 0321 11/30/17 0500  NA 137 139 134* 135  K 3.7 4.1 3.7 3.9  CL 105 105 98 99  CO2 23 27 27 26   GLUCOSE 362* 187* 251* 173*  BUN 35* 33* 39* 46*  CREATININE 1.39* 1.52* 1.44* 1.56*  CALCIUM 9.2 9.1 8.9 9.2   Liver Function Tests: No results for input(s): AST, ALT, ALKPHOS, BILITOT, PROT, ALBUMIN in the last 168 hours. No results for input(s): LIPASE, AMYLASE in the last 168 hours. No results for input(s): AMMONIA in the last 168 hours. CBC: Recent Labs  Lab 11/27/17 0054 11/29/17 0321  WBC 5.8 6.0  HGB 11.4* 11.4*  HCT 34.5* 34.7*  MCV 86.9 86.5  PLT 260 239   Cardiac Enzymes: Recent Labs  Lab 11/30/17 0750  TROPONINI <0.03   BNP: BNP (last 3 results) Recent Labs    09/19/17 1700 11/22/17 2330 11/27/17 0111  BNP 56.3 128.2* 59.5    ProBNP (last 3 results) No results for input(s): PROBNP in the last 8760 hours.  CBG: Recent Labs  Lab 11/29/17 0904 11/29/17 1240 11/29/17 1730 11/29/17 2148 11/30/17 0742  GLUCAP 244* 186* 197* 277* 166*       Signed:  Kayleen Memos, MD [facility] 11/30/2017, 11:31 AM

## 2017-12-01 ENCOUNTER — Other Ambulatory Visit: Payer: Self-pay

## 2017-12-01 ENCOUNTER — Other Ambulatory Visit: Payer: Self-pay | Admitting: Family Medicine

## 2017-12-01 ENCOUNTER — Telehealth: Payer: Self-pay

## 2017-12-01 DIAGNOSIS — M1A00X0 Idiopathic chronic gout, unspecified site, without tophus (tophi): Secondary | ICD-10-CM

## 2017-12-01 MED ORDER — LANSOPRAZOLE 15 MG PO CPDR: 15.0000 mg | DELAYED_RELEASE_CAPSULE | Freq: Every day | ORAL | 3 refills | Status: DC

## 2017-12-01 MED ORDER — INSULIN PEN NEEDLE 31G X 5 MM MISC: 1.0000 | Freq: Every day | 5 refills | Status: DC

## 2017-12-01 MED ORDER — ALLOPURINOL 300 MG PO TABS: 300.0000 mg | ORAL_TABLET | Freq: Every day | ORAL | 1 refills | Status: DC

## 2017-12-01 NOTE — Telephone Encounter (Signed)
Transition Care Management Follow-up Telephone Call  Date of discharge and from where: 11/30/2017 - [facility]  How have you been since you were released from the hospital? " feeling a little better."   Any questions or concerns? She said that she did not have any questions or concerns but she did not have her AVS.  She said that she left it at the hospital.   Items Reviewed:  Did the pt receive and understand the discharge instructions provided?   She did not have her AVS/ instructions and said that she could not pick up a copy at [facility] and her daughter was working all day and could not pick it up either. She said that she did not know anyone who could assist with picking up a copy of the AVS. This CM reviewed the discharge AVS in detail with the patient. She had all medications except the lansoprazole and allopurinol and needs needles for basaglar kwikpen. As noted on the AVS, she is to discontinue lantus.  She said that last night she did not take any insulin because she did not have the needles for the pen.  She is requesting a prescription for the needles and lansoprazole  be sent to [facility].  She said that she is in the process of switching to [facility] because they provide home delivery.  She needs to call the pharmacy for a refill of her  Allopurinol.    As per AVS, she is to take novolog three times daily per sliding scale and she said that she uses the sliding scale that Dr Margarita Rana gave her. She also stated that she understands that she needs to eat after taking the novolog. She was able to correctly repeat back all medication doses and frequency as well as the use for each medication. She said that she was told to stop 3 medications but did not remember which ones, Informed her that the AVS indicates to stop azithromycin, losartan and ibuprofen and she said that she understood.   Medications obtained and verified?  - please see above note regarding medications.   Any new allergies since  your discharge?  None reported   Dietary orders reviewed?  She said she is watching her sodium intake   Do you have support at home? She lives with her daughter  Other (ie: DME, [place], etc)  - has glucometer  Functional Questionnaire: (I = Independent and D = Dependent) ADL's: - I  Bathing/Dressing- I   Meal Prep- I  Eating- I  Maintaining continence- I  Transferring/Ambulation- I, no assistive device   Managing Meds- I.  Her daughter can assist if needed    Follow up appointments reviewed:    PCP Hospital f/u appt confirmed?  - yes, appointment with Dr Margarita Rana 12/13/17 @ [facility] f/u appt confirmed?  She said that she still needs to call the cardiologist to schedule an appointment.  Are transportation arrangements needed? She does not drive and her daughter does not drive. Informed her that [facility] will arrange cab transportation to her appointment.   If their condition worsens, is the pt aware to call  their PCP or go to the ED?  yes  Was the patient provided with contact information for the PCP's office or ED? Yes  Was the pt encouraged to call back with questions or concerns?  yes

## 2017-12-01 NOTE — Progress Notes (Signed)
Could you please inform this patient I have sent her requested refills-allopurinol, lansoprazole and pen needles for her Basaglar to the pharmacy?  Please confirm that Nancee Liter is covered by her insurance company as she has been switched from Lantus to [employer] and back due to insurance issues.Thank you.

## 2017-12-13 ENCOUNTER — Ambulatory Visit: Payer: Medicare HMO | Attending: Family Medicine | Admitting: Family Medicine

## 2017-12-13 ENCOUNTER — Encounter: Payer: Self-pay | Admitting: Family Medicine

## 2017-12-13 VITALS — BP 162/81 | HR 73 | Temp 98.0°F | Ht 59.0 in | Wt 177.4 lb

## 2017-12-13 DIAGNOSIS — E1140 Type 2 diabetes mellitus with diabetic neuropathy, unspecified: Secondary | ICD-10-CM | POA: Diagnosis not present

## 2017-12-13 DIAGNOSIS — Z8673 Personal history of transient ischemic attack (TIA), and cerebral infarction without residual deficits: Secondary | ICD-10-CM | POA: Insufficient documentation

## 2017-12-13 DIAGNOSIS — Z7982 Long term (current) use of aspirin: Secondary | ICD-10-CM | POA: Insufficient documentation

## 2017-12-13 DIAGNOSIS — R109 Unspecified abdominal pain: Secondary | ICD-10-CM | POA: Diagnosis not present

## 2017-12-13 DIAGNOSIS — Z79899 Other long term (current) drug therapy: Secondary | ICD-10-CM | POA: Insufficient documentation

## 2017-12-13 DIAGNOSIS — E1165 Type 2 diabetes mellitus with hyperglycemia: Secondary | ICD-10-CM | POA: Insufficient documentation

## 2017-12-13 DIAGNOSIS — I110 Hypertensive heart disease with heart failure: Secondary | ICD-10-CM | POA: Insufficient documentation

## 2017-12-13 DIAGNOSIS — M791 Myalgia, unspecified site: Secondary | ICD-10-CM

## 2017-12-13 DIAGNOSIS — I5022 Chronic systolic (congestive) heart failure: Secondary | ICD-10-CM

## 2017-12-13 DIAGNOSIS — Z794 Long term (current) use of insulin: Secondary | ICD-10-CM | POA: Diagnosis not present

## 2017-12-13 DIAGNOSIS — Z885 Allergy status to narcotic agent status: Secondary | ICD-10-CM | POA: Insufficient documentation

## 2017-12-13 DIAGNOSIS — E7800 Pure hypercholesterolemia, unspecified: Secondary | ICD-10-CM | POA: Insufficient documentation

## 2017-12-13 DIAGNOSIS — R0603 Acute respiratory distress: Secondary | ICD-10-CM | POA: Insufficient documentation

## 2017-12-13 DIAGNOSIS — I252 Old myocardial infarction: Secondary | ICD-10-CM | POA: Insufficient documentation

## 2017-12-13 LAB — POCT URINALYSIS DIP (CLINITEK)
BILIRUBIN UA: NEGATIVE
Glucose, UA: 250 mg/dL — AB
Ketones, POC UA: NEGATIVE mg/dL
LEUKOCYTES UA: NEGATIVE
NITRITE UA: NEGATIVE
PH UA: 5.5 (ref 5.0–8.0)
Spec Grav, UA: 1.02 (ref 1.010–1.025)
Urobilinogen, UA: 0.2 E.U./dL

## 2017-12-13 LAB — GLUCOSE, POCT (MANUAL RESULT ENTRY): POC GLUCOSE: 252 mg/dL — AB (ref 70–99)

## 2017-12-13 MED ORDER — FUROSEMIDE 40 MG PO TABS: 40.0000 mg | ORAL_TABLET | Freq: Two times a day (BID) | ORAL | 1 refills | Status: DC

## 2017-12-13 MED ORDER — INSULIN PEN NEEDLE 31G X 5 MM MISC: 1.0000 | Freq: Every day | 5 refills | Status: DC

## 2017-12-13 MED ORDER — LISINOPRIL 10 MG PO TABS: 10.0000 mg | ORAL_TABLET | Freq: Every day | ORAL | 6 refills | Status: DC

## 2017-12-13 MED ORDER — LANSOPRAZOLE 15 MG PO CPDR: 15.0000 mg | DELAYED_RELEASE_CAPSULE | Freq: Every day | ORAL | 3 refills | Status: DC

## 2017-12-13 MED ORDER — TRAMADOL HCL 50 MG PO TABS: 50.0000 mg | ORAL_TABLET | Freq: Two times a day (BID) | ORAL | 0 refills | Status: DC | PRN

## 2017-12-13 NOTE — Progress Notes (Signed)
Subjective:  Patient ID: Kara Hanson, female    DOB: [birth year]  Age: 62 y.o. MRN: [id number]  CC: Congestive Heart Failure   HPI Kara Hanson  is a 62 year old female with a history of type 2 diabetes mellitus (A1c 9.6), hypertension, congestive heart failure (EF 55-60% from echo of 11/2017) who presents today for a follow-up visit. She had a hospitalization at [facility] from 11/27/2017 through 11/30/2017 for acute respiratory distress secondary to CHF exacerbation.  Required BiPAP due to increased work of breathing and she was treated with IV Lasix.  Troponins were negative, EKG unremarkable, chest x-ray revealed cardiomegaly with vascular congestion, bibasilar atelectasis or infiltrates. Her condition improved and she was subsequently discharged.  She presents today complaining of difficulty climbing up the stairs due to weakness in her legs.  She also has left-sided pain which she describes as feeling like a sunburn and radiates to her back but denies the presence of the rash.  Her legs have been swollen despite taking her Lasix. Her blood pressure is elevated and she has been out of losartan.  Denies shortness of breath or chest pain. On further questioning she does not have a follow-up appointment with cardiology. She has also not been taking Basaglar for her diabetes as she has not had pen needles to administer it.  Past Medical History:  Diagnosis Date  . CHF (congestive heart failure) ([place])   . CVA (cerebral vascular accident) ([place])   . Diabetes mellitus without complication ([place])   . Hypercholesteremia   . Hypertension   . Myocardial infarction ([place])   . Spinal stenosis    Past Surgical History:  Procedure Laterality Date  . BLADDER SURGERY    . CARDIAC CATHETERIZATION N/A 04/25/2016   Procedure: Left Heart Cath and Coronary Angiography;  Surgeon: Lorretta Harp, MD;  Location: [place] [facility];  Service: Cardiovascular;  Laterality: N/A;  . CESAREAN  SECTION    . CHOLECYSTECTOMY      Allergies  Allergen Reactions  . Garlic Shortness Of Breath, Itching and Swelling    Hand itching and swelling  . Latex Itching  . Morphine And Related Itching and Other (See Comments)    Headache   . Other Itching    Reaction to newspaper ink -itching and headache     Outpatient Medications Prior to Visit  Medication Sig Dispense Refill  . aspirin EC 81 MG tablet Take 81 mg by mouth daily.    [place] Kitchen atorvastatin (LIPITOR) 20 MG tablet Take 1 tablet (20 mg total) by mouth daily. 90 tablet 1  . Blood Glucose Monitoring Suppl (ONE TOUCH ULTRA MINI) w/Device KIT UAD UP TO THREE TIMES DAILY DX E11.4 1 each 0  . carvedilol (COREG) 12.5 MG tablet Take 1 tablet (12.5 mg total) by mouth 2 (two) times daily with a meal. 180 tablet 1  . gabapentin (NEURONTIN) 300 MG capsule Take 2 capsules (600 mg total) by mouth 2 (two) times daily. 360 capsule 1  . glucose blood (ONE TOUCH ULTRA TEST) test strip UAD UP TO THREE TIMES DAILY DX E11.4 100 each 2  . insulin aspart (NOVOLOG) 100 UNIT/ML injection 0 to 12 units subcutaneously 3 times daily before meals as per sliding scale (Patient taking differently: Inject 0-12 Units into the skin 3 (three) times daily with meals. per sliding scale) 10 mL 1  . Lancet Device MISC Use as instructed 3 times daily 1 each 0  . meclizine (ANTIVERT) 25 MG tablet Take 1 tablet (25  mg total) by mouth 2 (two) times daily as needed. (Patient taking differently: Take 25 mg by mouth 2 (two) times daily as needed (vertigo). ) 60 tablet 1  . metoCLOPramide (REGLAN) 5 MG tablet Take 1 tablet (5 mg total) by mouth 3 (three) times daily before meals. 90 tablet 1  . ONETOUCH DELICA LANCETS 25E MISC UAD UP TO THREE TIMES DAILY DX E11.4 100 each 2  . tiZANidine (ZANAFLEX) 4 MG tablet Take 1 tablet (4 mg total) by mouth every 8 (eight) hours as needed for muscle spasms. 90 tablet 2  . furosemide (LASIX) 40 MG tablet Take 1 tablet (40 mg total) by mouth  daily. 90 tablet 1  . Insulin Pen Needle 31G X 5 MM MISC 1 each by Does not apply route at bedtime. 30 each 5  . allopurinol (ZYLOPRIM) 300 MG tablet Take 1 tablet (300 mg total) by mouth daily. (Patient not taking: Reported on 12/13/2017) 90 tablet 1  . Insulin Glargine (BASAGLAR KWIKPEN) 100 UNIT/ML SOPN Inject 35 units into the skin daily. (Patient not taking: Reported on 12/13/2017) 15 mL 3  . Multiple Vitamin (MULTIVITAMIN WITH MINERALS) TABS tablet Take 1 tablet by mouth daily. Women's One a Day    . lansoprazole (PREVACID) 15 MG capsule Take 1 capsule (15 mg total) by mouth daily. (Patient not taking: Reported on 12/13/2017) 30 capsule 3   No facility-administered medications prior to visit.     ROS Review of Systems  Constitutional: Negative for activity change, appetite change and fatigue.  HENT: Negative for congestion, sinus pressure and sore throat.   Eyes: Negative for visual disturbance.  Respiratory: Negative for cough, chest tightness, shortness of breath and wheezing.   Cardiovascular: Positive for leg swelling. Negative for chest pain and palpitations.  Gastrointestinal: Negative for abdominal distention, abdominal pain and constipation.  Endocrine: Negative for polydipsia.  Genitourinary: Negative for dysuria and frequency.  Musculoskeletal:       See hpi  Skin: Negative for rash.  Neurological: Negative for tremors, light-headedness and numbness.  Hematological: Does not bruise/bleed easily.  Psychiatric/Behavioral: Negative for agitation and behavioral problems.    Objective:  BP (!) 162/81   Pulse 73   Temp 98 F (36.7 C) (Oral)   Ht _0  (1.499 m)   Wt 177 lb 6.4 oz (80.5 kg)   SpO2 98%   BMI 35.83 kg/m   BP/Weight 12/13/2017 11/30/2017 08/29/7822  Systolic BP 235 361 -  Diastolic BP 81 64 -  Wt. (Lbs) 177.4 - 172.18  BMI 35.83 - 34.78      Physical Exam  Constitutional: She is oriented to person, place, and time. She appears well-developed and  well-nourished.  HENT:  Right Ear: External ear normal.  Left Ear: External ear normal.  Mouth/Throat: Oropharynx is clear and moist.  Cardiovascular: Normal rate, normal heart sounds and intact distal pulses.  No murmur heard. Pulmonary/Chest: Effort normal and breath sounds normal. She has no wheezes. She has no rales. She exhibits no tenderness.  Abdominal: Soft. Bowel sounds are normal. She exhibits no distension and no mass. There is no tenderness.  Musculoskeletal: Normal range of motion. She exhibits edema (1+ b/l pedal edema).  TTP of the left side of abdomen Negative CVA tenderness bilaterally  Neurological: She is alert and oriented to person, place, and time.     CMP Latest Ref Rng & Units 11/30/2017 11/29/2017 11/28/2017  Glucose 70 - 99 mg/dL 173(H) 251(H) 187(H)  BUN 8 - 23 mg/dL 46(H) 39(H)  33(H)  Creatinine 0.44 - 1.00 mg/dL 1.56(H) 1.44(H) 1.52(H)  Sodium 135 - 145 mmol/L 135 134(L) 139  Potassium 3.5 - 5.1 mmol/L 3.9 3.7 4.1  Chloride 98 - 111 mmol/L 99 98 105  CO2 22 - 32 mmol/L _0 Calcium 8.9 - 10.3 mg/dL 9.2 8.9 9.1  Total Protein 6.5 - 8.1 g/dL - - -  Total Bilirubin 0.3 - 1.2 mg/dL - - -  Alkaline Phos 38 - 126 U/L - - -  AST 15 - 41 U/L - - -  ALT 0 - 44 U/L - - -    CBC    Component Value Date/Time   WBC 6.0 11/29/2017 0321   RBC 4.01 11/29/2017 0321   HGB 11.4 (L) 11/29/2017 0321   HCT 34.7 (L) 11/29/2017 0321   PLT 239 11/29/2017 0321   MCV 86.5 11/29/2017 0321   MCH 28.4 11/29/2017 0321   MCHC 32.9 11/29/2017 0321   RDW 13.1 11/29/2017 0321   LYMPHSABS 1.1 11/22/2017 2330   MONOABS 0.5 11/22/2017 2330   EOSABS 0.1 11/22/2017 2330   BASOSABS 0.0 11/22/2017 2330    Lab Results  Component Value Date   HGBA1C 9.6 (H) 11/27/2017    Assessment & Plan:   1. Type 2 diabetes mellitus with diabetic neuropathy, with long-term current use of insulin (HCC) Uncontrolled with A1c of 9.6; she has not been taking Basaglar due to lack of needles  which I have refilled Regimen had been adjusted at her last visit Next A1c is due in 02/27/2018 - POCT glucose (manual entry) - Insulin Pen Needle 31G X 5 MM MISC; 1 each by Does not apply route at bedtime.  Dispense: 30 each; Refill: 5  2. Chronic systolic congestive heart failure (HCC) EF 55 to 60% from echo 08/2017 Recurrent CHF exacerbations Low-sodium diet, limit fluids to 2 L/day Increase Lasix to twice daily - Ambulatory referral to Cardiology - furosemide (LASIX) 40 MG tablet; Take 1 tablet (40 mg total) by mouth 2 (two) times daily.  Dispense: 180 tablet; Refill: 1  3. Myalgia From deconditioning versus diabetic myopathy - Ambulatory referral to Physical Therapy  4. Flank pain UA negative for UTI Renal ultrasound from 11/2017 was unremarkable No evidence of zoster rash Continue gabapentin I will add tramadol short-term. - traMADol (ULTRAM) 50 MG tablet; Take 1 tablet (50 mg total) by mouth every 12 (twelve) hours as needed.  Dispense: 30 tablet; Refill: 0 - POCT URINALYSIS DIP (CLINITEK)  5. Hypertension Uncontrolled due to being out of losartan I have switched her to lisinopril. Counseled on blood pressure goal of less than 130/80, low-sodium, DASH diet, medication compliance, 150 minutes of moderate intensity exercise per week. Discussed medication compliance, adverse effects.  Meds ordered this encounter  Medications  . Insulin Pen Needle 31G X 5 MM MISC    Sig: 1 each by Does not apply route at bedtime.    Dispense:  30 each    Refill:  5    Pen needles for Basaglar  . lansoprazole (PREVACID) 15 MG capsule    Sig: Take 1 capsule (15 mg total) by mouth daily.    Dispense:  30 capsule    Refill:  3  . lisinopril (PRINIVIL,ZESTRIL) 10 MG tablet    Sig: Take 1 tablet (10 mg total) by mouth daily.    Dispense:  30 tablet    Refill:  6  . furosemide (LASIX) 40 MG tablet    Sig: Take 1 tablet (40 mg  total) by mouth 2 (two) times daily.    Dispense:  180 tablet     Refill:  1  . traMADol (ULTRAM) 50 MG tablet    Sig: Take 1 tablet (50 mg total) by mouth every 12 (twelve) hours as needed.    Dispense:  30 tablet    Refill:  0    Follow-up: Return in about 6 weeks (around 01/24/2018) for coordination of care.   [name] MD

## 2017-12-13 NOTE — Patient Instructions (Signed)
Heart Failure °Heart failure means your heart has trouble pumping blood. This makes it hard for your body to work well. Heart failure is usually a long-term (chronic) condition. You must take good care of yourself and follow your doctor's treatment plan. °Follow these instructions at home: °· Take your heart medicine as told by your doctor. °? Do not stop taking medicine unless your doctor tells you to. °? Do not skip any dose of medicine. °? Refill your medicines before they run out. °? Take other medicines only as told by your doctor or pharmacist. °· Stay active if told by your doctor. The elderly and people with severe heart failure should talk with a doctor about physical activity. °· Eat heart-healthy foods. Choose foods that are without trans fat and are low in saturated fat, cholesterol, and salt (sodium). This includes fresh or frozen fruits and vegetables, fish, lean meats, fat-free or low-fat dairy foods, whole grains, and high-fiber foods. Lentils and dried peas and beans (legumes) are also good choices. °· Limit salt if told by your doctor. °· Cook in a healthy way. Roast, grill, broil, bake, poach, steam, or stir-fry foods. °· Limit fluids as told by your doctor. °· Weigh yourself every morning. Do this after you pee (urinate) and before you eat breakfast. Write down your weight to give to your doctor. °· Take your blood pressure and write it down if your doctor tells you to. °· Ask your doctor how to check your pulse. Check your pulse as told. °· Lose weight if told by your doctor. °· Stop smoking or chewing tobacco. Do not use gum or patches that help you quit without your doctor's approval. °· Schedule and go to doctor visits as told. °· Nonpregnant women should have no more than 1 drink a day. Men should have no more than 2 drinks a day. Talk to your doctor about drinking alcohol. °· Stop illegal drug use. °· Stay current with shots (immunizations). °· Manage your health conditions as told by your  doctor. °· Learn to manage your stress. °· Rest when you are tired. °· If it is really hot outside: °? Avoid intense activities. °? Use air conditioning or fans, or get in a cooler place. °? Avoid caffeine and alcohol. °? Wear loose-fitting, lightweight, and light-colored clothing. °· If it is really cold outside: °? Avoid intense activities. °? Layer your clothing. °? Wear mittens or gloves, a hat, and a scarf when going outside. °? Avoid alcohol. °· Learn about heart failure and get support as needed. °· Get help to maintain or improve your quality of life and your ability to care for yourself as needed. °Contact a doctor if: °· You gain weight quickly. °· You are more short of breath than usual. °· You cannot do your normal activities. °· You tire easily. °· You cough more than normal, especially with activity. °· You have any or more puffiness (swelling) in areas such as your hands, feet, ankles, or belly (abdomen). °· You cannot sleep because it is hard to breathe. °· You feel like your heart is beating fast (palpitations). °· You get dizzy or light-headed when you stand up. °Get help right away if: °· You have trouble breathing. °· There is a change in mental status, such as becoming less alert or not being able to focus. °· You have chest pain or discomfort. °· You faint. °This information is not intended to replace advice given to you by your health care provider. Make sure you   discuss any questions you have with your health care provider. °Document Released: 12/29/2007 Document Revised: 08/27/2015 Document Reviewed: 05/07/2012 °Elsevier Interactive Patient Education © 2017 [employer] ° °

## 2018-01-02 ENCOUNTER — Emergency Department (HOSPITAL_COMMUNITY)
Admission: EM | Admit: 2018-01-02 | Discharge: 2018-01-02 | Disposition: A | Discharge status: Home / Self Care | Payer: Medicare HMO | Attending: Emergency Medicine | Admitting: Emergency Medicine

## 2018-01-02 ENCOUNTER — Encounter (HOSPITAL_COMMUNITY): Payer: Self-pay

## 2018-01-02 ENCOUNTER — Emergency Department (HOSPITAL_COMMUNITY): Payer: Medicare HMO

## 2018-01-02 DIAGNOSIS — I110 Hypertensive heart disease with heart failure: Secondary | ICD-10-CM | POA: Diagnosis not present

## 2018-01-02 DIAGNOSIS — R1011 Right upper quadrant pain: Secondary | ICD-10-CM

## 2018-01-02 DIAGNOSIS — R1013 Epigastric pain: Secondary | ICD-10-CM | POA: Insufficient documentation

## 2018-01-02 DIAGNOSIS — R109 Unspecified abdominal pain: Secondary | ICD-10-CM | POA: Diagnosis present

## 2018-01-02 DIAGNOSIS — I509 Heart failure, unspecified: Secondary | ICD-10-CM | POA: Insufficient documentation

## 2018-01-02 LAB — COMPREHENSIVE METABOLIC PANEL
ALT: 19 U/L (ref 0–44)
AST: 34 U/L (ref 15–41)
Albumin: 3.1 g/dL — ABNORMAL LOW (ref 3.5–5.0)
Alkaline Phosphatase: 75 U/L (ref 38–126)
Anion gap: 8 (ref 5–15)
BUN: 25 mg/dL — AB (ref 8–23)
CHLORIDE: 102 mmol/L (ref 98–111)
CO2: 23 mmol/L (ref 22–32)
Calcium: 9.5 mg/dL (ref 8.9–10.3)
Creatinine, Ser: 1.15 mg/dL — ABNORMAL HIGH (ref 0.44–1.00)
GFR, EST AFRICAN AMERICAN: 57 mL/min — AB (ref >60)
GFR, EST NON AFRICAN AMERICAN: 50 mL/min — AB (ref >60)
Glucose, Bld: 347 mg/dL — ABNORMAL HIGH (ref 70–99)
Potassium: 4.3 mmol/L (ref 3.5–5.1)
SODIUM: 133 mmol/L — AB (ref 135–145)
Total Bilirubin: 1 mg/dL (ref 0.3–1.2)
Total Protein: 6.4 g/dL — ABNORMAL LOW (ref 6.5–8.1)

## 2018-01-02 LAB — CBC
HEMATOCRIT: 38.6 % (ref 36.0–46.0)
Hemoglobin: 12.6 g/dL (ref 12.0–15.0)
MCH: 27.6 pg (ref 26.0–34.0)
MCHC: 32.6 g/dL (ref 30.0–36.0)
MCV: 84.6 fL (ref 78.0–100.0)
PLATELETS: 269 10*3/uL (ref 150–400)
RBC: 4.56 MIL/uL (ref 3.87–5.11)
RDW: 13.5 % (ref 11.5–15.5)
WBC: 6.1 10*3/uL (ref 4.0–10.5)

## 2018-01-02 LAB — URINALYSIS, ROUTINE W REFLEX MICROSCOPIC
Bacteria, UA: NONE SEEN
Bilirubin Urine: NEGATIVE
Glucose, UA: >=500 mg/dL — AB
HGB URINE DIPSTICK: NEGATIVE
Ketones, ur: NEGATIVE mg/dL
LEUKOCYTES UA: NEGATIVE
Nitrite: NEGATIVE
Protein, ur: >=300 mg/dL — AB
Specific Gravity, Urine: 1.039 — ABNORMAL HIGH (ref 1.005–1.030)
pH: 6 (ref 5.0–8.0)

## 2018-01-02 LAB — D-DIMER, QUANTITATIVE: D-Dimer, Quant: 0.68 ug/mL-FEU — ABNORMAL HIGH (ref 0.00–0.50)

## 2018-01-02 LAB — CBG MONITORING, ED: Glucose-Capillary: 319 mg/dL — ABNORMAL HIGH (ref 70–99)

## 2018-01-02 LAB — I-STAT TROPONIN, ED: Troponin i, poc: 0 ng/mL (ref 0.00–0.08)

## 2018-01-02 LAB — LIPASE, BLOOD: LIPASE: 27 U/L (ref 11–51)

## 2018-01-02 MED ORDER — DICYCLOMINE HCL 20 MG PO TABS: 20.0000 mg | ORAL_TABLET | Freq: Two times a day (BID) | ORAL | 0 refills | Status: DC

## 2018-01-02 MED ORDER — GI COCKTAIL ~~LOC~~
30.0000 mL | Freq: Once | ORAL | Status: AC
  Administered 2018-01-02: 30 mL via ORAL
  Filled 2018-01-02: qty 30

## 2018-01-02 MED ORDER — FENTANYL CITRATE (PF) 100 MCG/2ML IJ SOLN
50.0000 ug | Freq: Once | INTRAMUSCULAR | Status: AC
  Administered 2018-01-02: 50 ug via INTRAVENOUS
  Filled 2018-01-02: qty 2

## 2018-01-02 MED ORDER — IOHEXOL 300 MG/ML  SOLN
100.0000 mL | Freq: Once | INTRAMUSCULAR | Status: AC | PRN
  Administered 2018-01-02: 100 mL via INTRAVENOUS

## 2018-01-02 MED ORDER — DIPHENHYDRAMINE HCL 25 MG PO TABS: 25.0000 mg | ORAL_TABLET | Freq: Four times a day (QID) | ORAL | 0 refills | Status: DC

## 2018-01-02 MED ORDER — SUCRALFATE 1 G PO TABS: 1.0000 g | ORAL_TABLET | Freq: Four times a day (QID) | ORAL | 0 refills | Status: DC | PRN

## 2018-01-02 MED ORDER — CEFDINIR 300 MG PO CAPS
300.0000 mg | ORAL_CAPSULE | Freq: Once | ORAL | Status: DC
  Filled 2018-01-02: qty 1

## 2018-01-02 MED ORDER — AZITHROMYCIN 250 MG PO TABS
500.0000 mg | ORAL_TABLET | Freq: Once | ORAL | Status: AC
  Administered 2018-01-02: 500 mg via ORAL
  Filled 2018-01-02: qty 2

## 2018-01-02 MED ORDER — FAMOTIDINE IN NACL 20-0.9 MG/50ML-% IV SOLN
20.0000 mg | Freq: Once | INTRAVENOUS | Status: AC
  Administered 2018-01-02: 20 mg via INTRAVENOUS
  Filled 2018-01-02: qty 50

## 2018-01-02 NOTE — ED Notes (Signed)
Pt returned from ct

## 2018-01-02 NOTE — ED Triage Notes (Signed)
Per [facility], pt coming from home with complaints of sudden RUQ abdominal pain starting last night. Pt states that she has not been able to eat for two days because of being nauseous. Pt also states that she has not had a bowel movement for a couple of days.

## 2018-01-02 NOTE — Discharge Instructions (Addendum)
You were evaluated in the Emergency Department and after careful evaluation, we did not find any emergent condition requiring admission or further testing in the hospital.  Your symptoms today seem to be due to pain related to acid in your stomach.  Please avoid acidic food, continue taking your lansoprazole at home, you can use the additional medicines provided here as needed for pain.  Please return to the Emergency Department if you experience any worsening of your condition.  We encourage you to follow up with a primary care provider.  Thank you for allowing [place] to be a part of your care.

## 2018-01-02 NOTE — ED Notes (Signed)
Patient transported to CT 

## 2018-01-02 NOTE — ED Notes (Signed)
Pt took today's dose of Carvedilol ( home med) per Dr. Sedonia Small

## 2018-01-02 NOTE — ED Notes (Signed)
Patient verbalizes understanding of discharge instructions. Opportunity for questioning and answers were provided. Pt discharged from ED. 

## 2018-01-02 NOTE — ED Notes (Signed)
CBG 319

## 2018-01-02 NOTE — ED Provider Notes (Signed)
[facility] Emergency Department Provider Note MRN:  [id number]  Arrival date & time: 01/02/18     Chief Complaint   Abdominal Pain   History of Present Illness   Kara Hanson is a 62 y.o. year-old female with a history of stroke, CHF, diabetes, cholecystectomy presenting to the ED with chief complaint of abdominal pain.  The pain is located in the right upper quadrant, nonradiating.  The pain began this morning 5 to 6 hours ago, began suddenly, has been severe, 10 out of 10.  Preceded by general malaise all day yesterday.  Patient denies recent fevers, no lower abdominal pain, no dysuria, no headache or vision change, no chest pain or shortness of breath.  No exacerbating or alleviating factors.  Review of Systems  A complete 10 system review of systems was obtained and all systems are negative except as noted in the HPI and PMH.   Patient's Health History    Past Medical History:  Diagnosis Date  . CHF (congestive heart failure) ([place])   . CVA (cerebral vascular accident) ([place])   . Diabetes mellitus without complication ([place])   . Hypercholesteremia   . Hypertension   . Myocardial infarction ([place])   . Spinal stenosis     Past Surgical History:  Procedure Laterality Date  . BLADDER SURGERY    . CARDIAC CATHETERIZATION N/A 04/25/2016   Procedure: Left Heart Cath and Coronary Angiography;  Surgeon: Lorretta Harp, MD;  Location: [place] [facility];  Service: Cardiovascular;  Laterality: N/A;  . CESAREAN SECTION    . CHOLECYSTECTOMY      Family History  Problem Relation Age of Onset  . Diabetes Mellitus II Father   . Stroke Father     Social History   Socioeconomic History  . Marital status: Widowed    Spouse name: Not on file  . Number of children: Not on file  . Years of education: Not on file  . Highest education level: Not on file  Occupational History  . Not on file  Social Needs  . Financial resource strain: Not on file  . Food  insecurity:    Worry: Not on file    Inability: Not on file  . Transportation needs:    Medical: Not on file    Non-medical: Not on file  Tobacco Use  . Smoking status: Never Smoker  . Smokeless tobacco: Never Used  Substance and Sexual Activity  . Alcohol use: No  . Drug use: No  . Sexual activity: Not Currently    Birth control/protection: None  Lifestyle  . Physical activity:    Days per week: Not on file    Minutes per session: Not on file  . Stress: Not on file  Relationships  . Social connections:    Talks on phone: Not on file    Gets together: Not on file    Attends religious service: Not on file    Active member of club or organization: Not on file    Attends meetings of clubs or organizations: Not on file    Relationship status: Not on file  . Intimate partner violence:    Fear of current or ex partner: Not on file    Emotionally abused: Not on file    Physically abused: Not on file    Forced sexual activity: Not on file  Other Topics Concern  . Not on file  Social History Narrative  . Not on file     Physical Exam  Vital Signs and Nursing Notes reviewed Vitals:   01/02/18 1548 01/02/18 1551  BP:  (!) 176/76  Pulse: 76 89  Resp: 18 16  Temp:    SpO2: 100% 96%    CONSTITUTIONAL: Well-appearing, in moderate distress due to pain NEURO:  Alert and oriented x 3, no focal deficits EYES:  eyes equal and reactive ENT/NECK:  no LAD, no JVD CARDIO: Regular rate, well-perfused, normal S1 and S2 PULM:  CTAB no wheezing or rhonchi GI/GU:  normal bowel sounds, non-distended, moderate right upper quadrant tenderness to palpation MSK/SPINE:  No gross deformities, no edema SKIN:  no rash, atraumatic PSYCH:  Appropriate speech and behavior  Diagnostic and Interventional Summary    EKG Interpretation  Date/Time:  Tuesday January 02 2018 11:45:37 EDT Ventricular Rate:  83 PR Interval:    QRS Duration: 101 QT Interval:  404 QTC Calculation: 475 R  Axis:   24 Text Interpretation:  Sinus rhythm Low voltage, extremity and precordial leads Nonspecific T abnormalities, lateral leads Artifact in lead(s) V6 and baseline wander in lead(s) V6 Confirmed by Gerlene Fee [phone]) on 01/02/2018 12:14:56 PM      Labs Reviewed  COMPREHENSIVE METABOLIC PANEL - Abnormal; Notable for the following components:      Result Value   Sodium 133 (*)    Glucose, Bld 347 (*)    BUN 25 (*)    Creatinine, Ser 1.15 (*)    Total Protein 6.4 (*)    Albumin 3.1 (*)    GFR calc non Af Amer 50 (*)    GFR calc Af Amer 57 (*)    All other components within normal limits  URINALYSIS, ROUTINE W REFLEX MICROSCOPIC - Abnormal; Notable for the following components:   APPearance HAZY (*)    Specific Gravity, Urine 1.039 (*)    Glucose, UA >=500 (*)    Protein, ur >=300 (*)    All other components within normal limits  D-DIMER, QUANTITATIVE (NOT AT [facility]) - Abnormal; Notable for the following components:   D-Dimer, Quant 0.68 (*)    All other components within normal limits  CBG MONITORING, ED - Abnormal; Notable for the following components:   Glucose-Capillary 319 (*)    All other components within normal limits  LIPASE, BLOOD  CBC  I-STAT TROPONIN, ED    CT ABDOMEN PELVIS W CONTRAST  Final Result    DG Chest Port 1 View  Final Result      Medications  fentaNYL (SUBLIMAZE) injection 50 mcg (50 mcg Intravenous Given 01/02/18 1212)  iohexol (OMNIPAQUE) 300 MG/ML solution 100 mL (100 mLs Intravenous Contrast Given 01/02/18 1440)  gi cocktail (Maalox,Lidocaine,Donnatal) (30 mLs Oral Given 01/02/18 1520)  famotidine (PEPCID) IVPB 20 mg premix (20 mg Intravenous New Bag/Given 01/02/18 1525)  fentaNYL (SUBLIMAZE) injection 50 mcg (50 mcg Intravenous Given 01/02/18 1521)  azithromycin (ZITHROMAX) tablet 500 mg (500 mg Oral Given 01/02/18 1549)     Procedures Critical Care  ED Course and Medical Decision Making  I have reviewed the triage vital signs and the  nursing notes.  Pertinent labs & imaging results that were available during my care of the patient were reviewed by me and considered in my medical decision making (see below for details).  Acute right upper quadrant pain in this 62 year old female with history of multiple comorbidities, as well as cholecystitis.  No fever.  Labs and CT pending.  EKG with no ischemic changes, troponin negative, not consistent with cardiac etiology of pain.  Labs largely unremarkable,  CT with no acute process.  Chest x-ray did reveal likely right lower lobe atelectasis.  Patient had a recent pneumonia, has had complete resolution of her cough and fevers.  This is favored to be chronic.  Prior to the knowledge of her recent pneumonia and resolution of symptoms, single dose of antibiotics were given here in the ED.  Labs are really only remarkable for a very mildly elevated d-dimer.  However she has had a recent similarly elevated d-dimer that was evaluated with CTPA last month which was unremarkable for pulmonary embolism.  Explained to patient that with this mildly elevated d-dimer, there is still a possibility of blood clots in the lungs but is favored to be unlikely at this time.  Patient elects to keep minor symptoms and defer CT imaging of her chest at this time.  Patient feeling much better after medications provided above, favoring GERD or gastritis.  Patient explains that she has a hiatal hernia.  Pain feels like a burning sensation.  Taking lansoprazole at home, provided with prescription for Carafate, strict return precautions.  After the discussed management above, the patient was determined to be safe for discharge.  The patient was in agreement with this plan and all questions regarding their care were answered.  ED return precautions were discussed and the patient will return to the ED with any significant worsening of condition.   Barth Kirks. Sedonia Small, [place] [email or web address]  Final Clinical Impressions(s) / ED Diagnoses     ICD-10-CM   1. Epigastric pain R10.13   2. RUQ pain R10.11 DG Chest Port 1 View    DG Chest Port 1 View    ED Discharge Orders         Ordered    sucralfate (CARAFATE) 1 g tablet  4 times daily PRN     01/02/18 1558    diphenhydrAMINE (BENADRYL) 25 MG tablet  Every 6 hours,   Status:  Discontinued     01/02/18 1558    dicyclomine (BENTYL) 20 MG tablet  2 times daily     01/02/18 1604             Maudie Flakes, MD 01/02/18 1604

## 2018-01-03 ENCOUNTER — Other Ambulatory Visit: Payer: Self-pay

## 2018-01-03 ENCOUNTER — Emergency Department (HOSPITAL_COMMUNITY)
Admission: EM | Admit: 2018-01-03 | Discharge: 2018-01-03 | Disposition: A | Discharge status: Home / Self Care | Payer: Medicare HMO | Attending: Emergency Medicine | Admitting: Emergency Medicine

## 2018-01-03 ENCOUNTER — Encounter (HOSPITAL_COMMUNITY): Payer: Self-pay | Admitting: Emergency Medicine

## 2018-01-03 DIAGNOSIS — R1010 Upper abdominal pain, unspecified: Secondary | ICD-10-CM | POA: Insufficient documentation

## 2018-01-03 LAB — COMPREHENSIVE METABOLIC PANEL
ALBUMIN: 2.9 g/dL — AB (ref 3.5–5.0)
ALK PHOS: 73 U/L (ref 38–126)
ALT: 16 U/L (ref 0–44)
AST: 18 U/L (ref 15–41)
Anion gap: 9 (ref 5–15)
BILIRUBIN TOTAL: 0.3 mg/dL (ref 0.3–1.2)
BUN: 22 mg/dL (ref 8–23)
CALCIUM: 9.3 mg/dL (ref 8.9–10.3)
CO2: 23 mmol/L (ref 22–32)
Chloride: 102 mmol/L (ref 98–111)
Creatinine, Ser: 1.5 mg/dL — ABNORMAL HIGH (ref 0.44–1.00)
GFR calc Af Amer: 42 mL/min — ABNORMAL LOW (ref >60)
GFR calc non Af Amer: 36 mL/min — ABNORMAL LOW (ref >60)
GLUCOSE: 354 mg/dL — AB (ref 70–99)
Potassium: 3.5 mmol/L (ref 3.5–5.1)
Sodium: 134 mmol/L — ABNORMAL LOW (ref 135–145)
TOTAL PROTEIN: 6 g/dL — AB (ref 6.5–8.1)

## 2018-01-03 LAB — CBC WITH DIFFERENTIAL/PLATELET
ABS IMMATURE GRANULOCYTES: 0 10*3/uL (ref 0.0–0.1)
BASOS ABS: 0 10*3/uL (ref 0.0–0.1)
Basophils Relative: 0 %
EOS ABS: 0.1 10*3/uL (ref 0.0–0.7)
EOS PCT: 1 %
HEMATOCRIT: 37.9 % (ref 36.0–46.0)
HEMOGLOBIN: 12.4 g/dL (ref 12.0–15.0)
IMMATURE GRANULOCYTES: 0 %
Lymphocytes Relative: 14 %
Lymphs Abs: 0.9 10*3/uL (ref 0.7–4.0)
MCH: 28.2 pg (ref 26.0–34.0)
MCHC: 32.7 g/dL (ref 30.0–36.0)
MCV: 86.1 fL (ref 78.0–100.0)
Monocytes Absolute: 0.4 10*3/uL (ref 0.1–1.0)
Monocytes Relative: 6 %
Neutro Abs: 5 10*3/uL (ref 1.7–7.7)
Neutrophils Relative %: 79 %
Platelets: 261 10*3/uL (ref 150–400)
RBC: 4.4 MIL/uL (ref 3.87–5.11)
RDW: 13.5 % (ref 11.5–15.5)
WBC: 6.5 10*3/uL (ref 4.0–10.5)

## 2018-01-03 NOTE — ED Notes (Addendum)
As soon as pt was being moved from triage room she states she is leaving to go home to lay down.  Encouraged pt to stay.

## 2018-01-03 NOTE — ED Triage Notes (Signed)
Pt to triage via [facility] with C/o upper abd pain x 2 days with nausea and diarrhea.  Denies vomiting.  Seen in ED yesterday for same but didn't get Rx filled because she felt too bad to go to pharmacy.  Not taking her regular medications due to nausea.  CBG 348 per EMS.

## 2018-01-03 NOTE — ED Notes (Signed)
Hope, NP saw pt and triage.  Pt seen in ED yesterday.  Only repeat CBC and CMP per [facility].

## 2018-01-03 NOTE — ED Notes (Addendum)
Pt was seen walking out to a vehicle at the ED entrance.

## 2018-01-03 NOTE — ED Provider Notes (Signed)
Patient placed in Quick Look pathway, seen and evaluated   Chief Complaint: abdominal pain  HPI: Kara Hanson is a 62 y.o. female who presents to the ED via EMS for abdominal pain. Patient was here yesterday and had a complete work up but reports she did not get her medications. She has her Rx with her. The pain is located in the upper abdomen with nausea and diarrhea. Patient has also not been taking her regular medications due to nausea.  ROS: GI: n/v, abdominal pain  Physical Exam:  BP (!) 186/87 (BP Location: Right Arm)   Pulse 88   Temp 98.5 F (36.9 C) (Oral)   Resp 20   SpO2 97%    Gen: No distress  Skin: Warm and dry  Abdomen: soft tender with palpation of upper abdomen     Initiation of care has begun. The patient has been counseled on the process, plan, and necessity for staying for the completion/evaluation, and the remainder of the medical screening examination    Ashley Murrain, NP 01/03/18 Zandra Abts, MD 01/08/18 1229

## 2018-02-11 IMAGING — CT CT ANGIO CHEST
2 of 6 series · 18 of 36 positions shown · IV contrast (ISOVUE 370)
Comparison: Chest x-ray [DATE]

CLINICAL DATA: Shortness of breath, chest pain starting this
morning

EXAM:
CT ANGIOGRAPHY CHEST WITH CONTRAST
TECHNIQUE: Multidetector CT imaging of the chest was performed using the
standard protocol during bolus administration of intravenous
contrast. Multiplanar CT image reconstructions and MIPs were
obtained to evaluate the vascular anatomy.
CONTRAST:  100 cc Isovue

[Series 5: coronal mpr · coronal · 0.45mm/px · 1 of 120 slices shown]
[im 60/120  mediastinal]
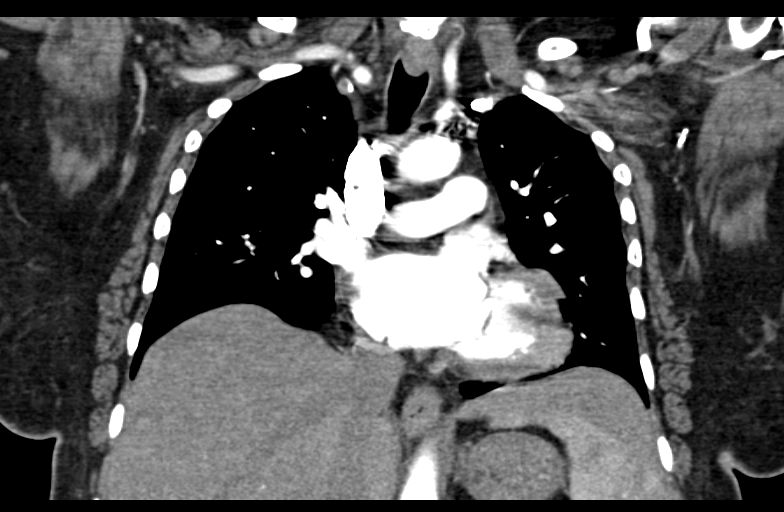

[Series 10: thins for pacs · axial · 0.71mm/px · z∈[+807,+1001]mm · 17 of 216 slices shown]
[im 11/216  lung]
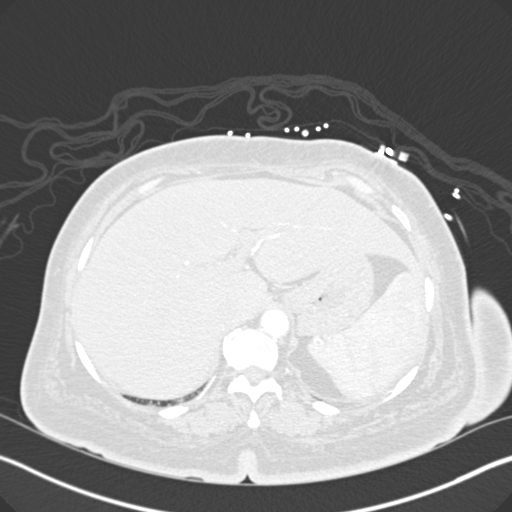
[im 22/216  mediastinal]
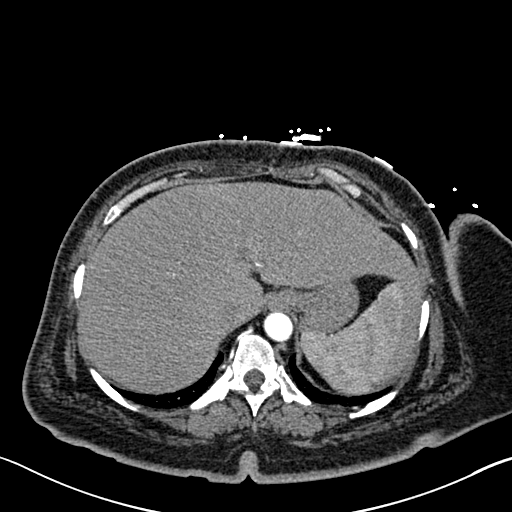
[im 33/216  lung]
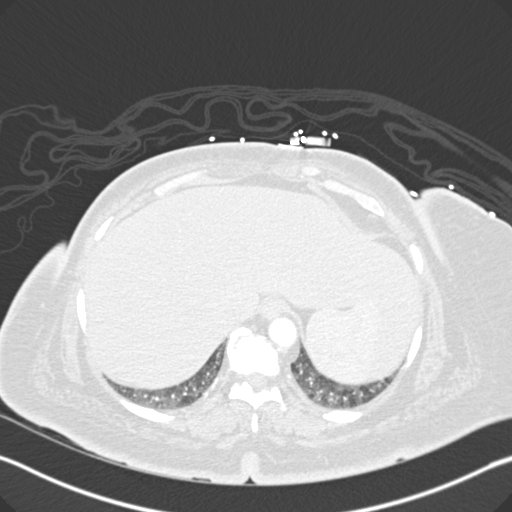
[im 44/216  mediastinal]
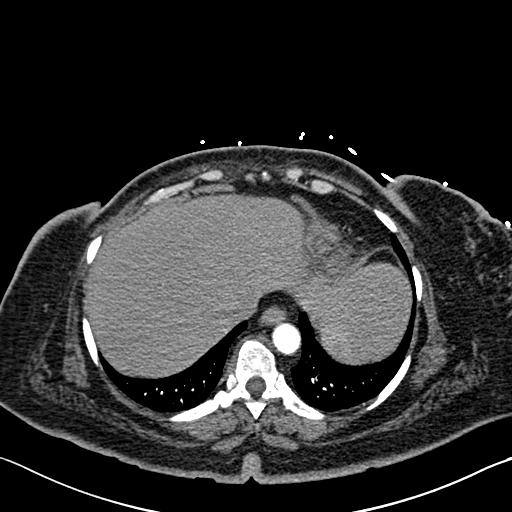
[im 65/216  lung]
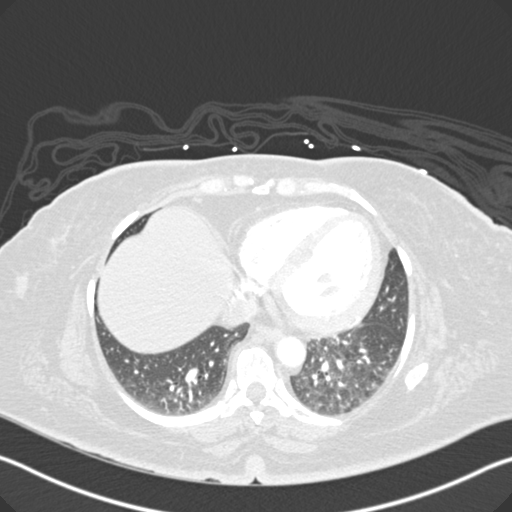
[im 76/216  mediastinal]
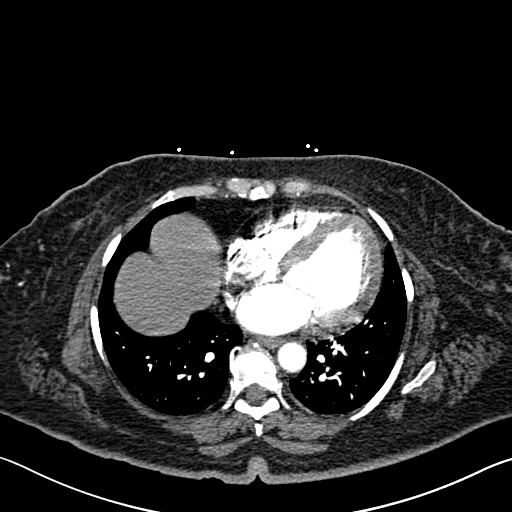
[im 87/216  lung]
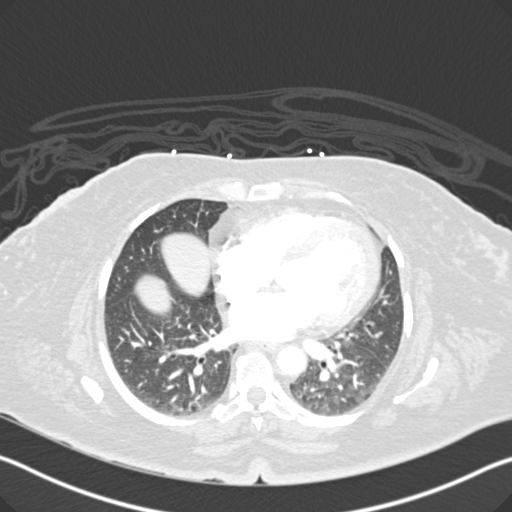
[im 97/216  mediastinal]
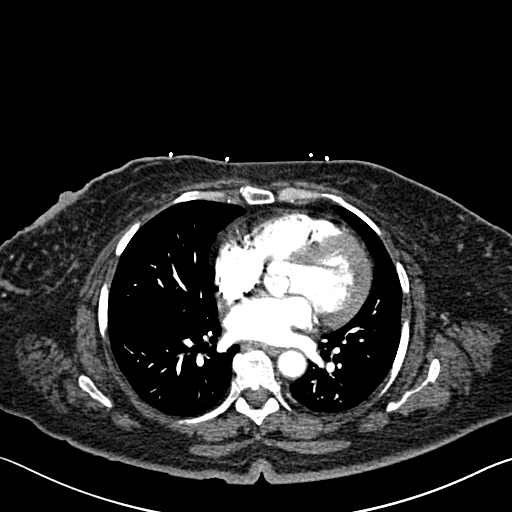
[im 108/216  lung]
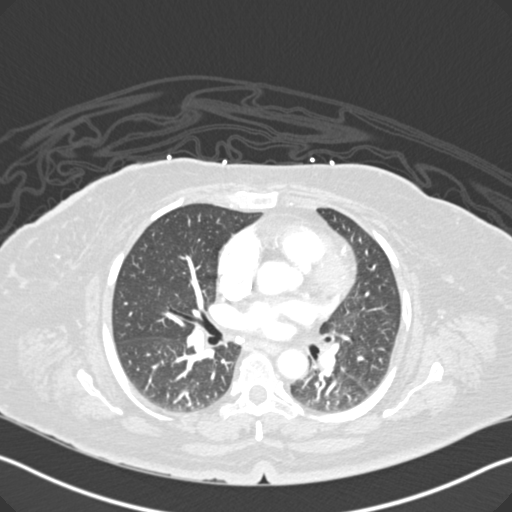
[im 119/216  mediastinal]
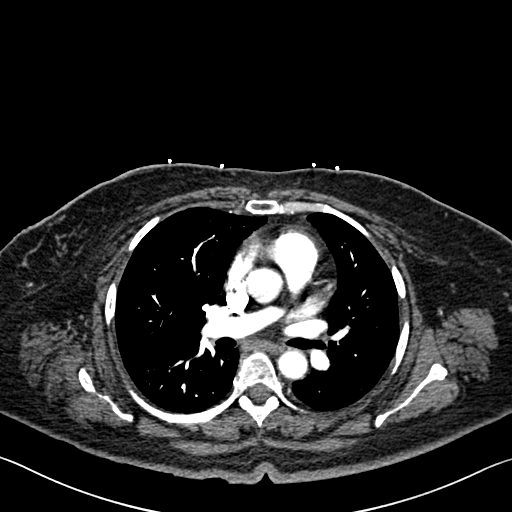
[im 130/216  lung]
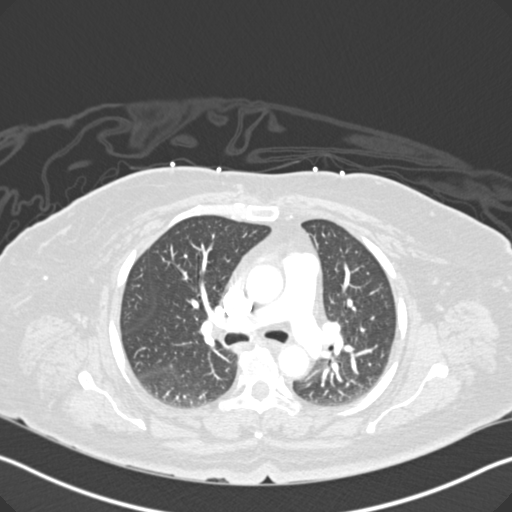
[im 140/216  mediastinal]
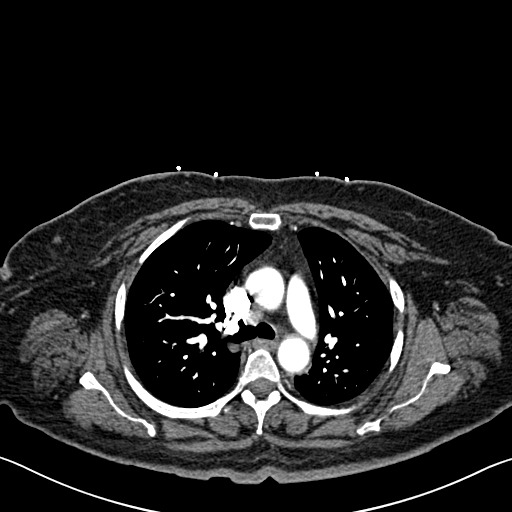
[im 151/216  lung]
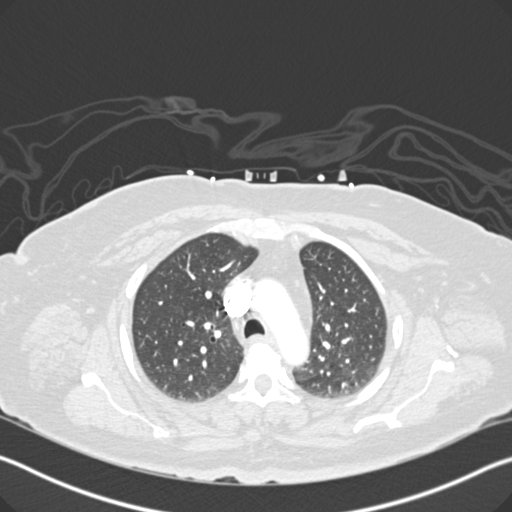
[im 173/216  mediastinal]
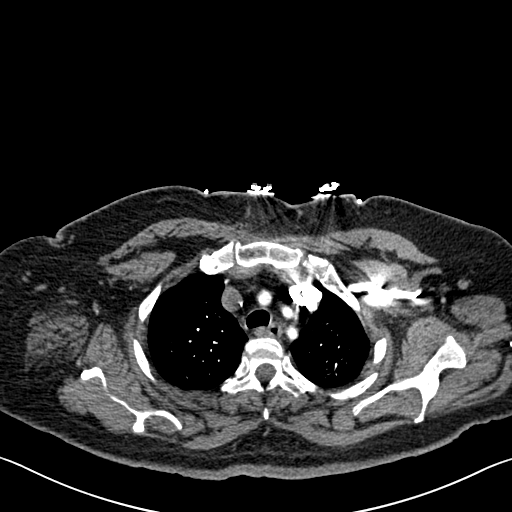
[im 183/216  lung]
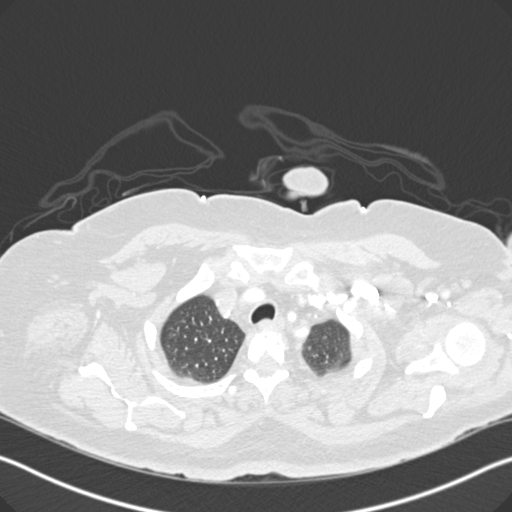
[im 194/216  mediastinal]
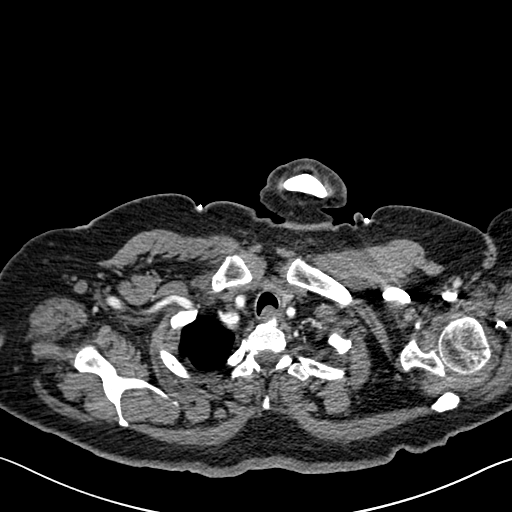
[im 205/216  lung]
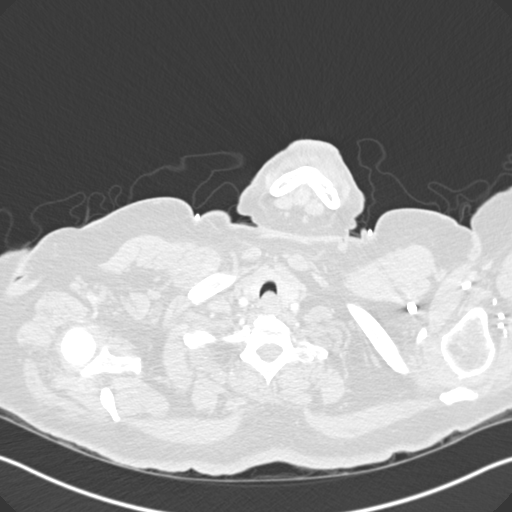

[18 of 36 positions shown; findings below may reference images not displayed]

FINDINGS: Cardiovascular: Mild atherosclerotic calcifications of thoracic
aorta. Atherosclerotic calcifications of coronary arteries. Heart
size within normal limits. No pericardial effusion. The study is of
excellent technical quality. There is no evidence of aortic aneurysm
or dissection.

No pulmonary embolus is noted

Mediastinum/Nodes: There is no mediastinal hematoma or adenopathy.
No hilar adenopathy is noted.

Lungs/Pleura: Images of the lung parenchyma shows no acute
infiltrate or pulmonary edema. No pneumothorax. No focal
consolidation. No pleural effusion. There is no bronchiectasis.

Upper Abdomen: The visualized upper abdomen is unremarkable.

Musculoskeletal: Sagittal images of the spine shows degenerative
changes mid and lower thoracic spine with multilevel anterior
spurring. No destructive bony lesions are noted. No rib fractures
are noted. No axillary adenopathy.

Review of the MIP images confirms the above findings.
IMPRESSION: 1. No infiltrate or pulmonary edema.
2. No pulmonary embolus is noted.  No aortic aneurysm or dissection.
3. No mediastinal hematoma or adenopathy.
4. Mild atherosclerotic calcifications of thoracic aorta and
coronary arteries.

## 2018-02-19 ENCOUNTER — Ambulatory Visit: Payer: Medicare HMO | Admitting: Family Medicine

## 2018-02-27 ENCOUNTER — Telehealth: Payer: Self-pay | Admitting: Family Medicine

## 2018-02-27 NOTE — Telephone Encounter (Signed)
1) Medication(s) Requested (by name): novolog lantus 2) Pharmacy of Choice:  [facility]  Daughter was advised that patient may go to the ED or UC if needed.

## 2018-02-28 ENCOUNTER — Other Ambulatory Visit: Payer: Self-pay

## 2018-02-28 DIAGNOSIS — E1140 Type 2 diabetes mellitus with diabetic neuropathy, unspecified: Secondary | ICD-10-CM

## 2018-02-28 DIAGNOSIS — Z794 Long term (current) use of insulin: Principal | ICD-10-CM

## 2018-02-28 MED ORDER — BASAGLAR KWIKPEN 100 UNIT/ML ~~LOC~~ SOPN: PEN_INJECTOR | SUBCUTANEOUS | 3 refills | Status: DC

## 2018-02-28 MED ORDER — INSULIN ASPART 100 UNIT/ML ~~LOC~~ SOLN: SUBCUTANEOUS | 1 refills | Status: DC

## 2018-02-28 NOTE — Telephone Encounter (Signed)
Medication has been filled and sent to requested pharmacy.

## 2018-03-13 ENCOUNTER — Ambulatory Visit: Payer: Medicare HMO | Admitting: Nurse Practitioner

## 2018-03-20 ENCOUNTER — Other Ambulatory Visit: Payer: Self-pay

## 2018-03-20 ENCOUNTER — Emergency Department (HOSPITAL_COMMUNITY): Payer: Medicare HMO

## 2018-03-20 ENCOUNTER — Other Ambulatory Visit: Payer: Self-pay | Admitting: Family Medicine

## 2018-03-20 ENCOUNTER — Encounter (HOSPITAL_COMMUNITY): Payer: Self-pay | Admitting: Emergency Medicine

## 2018-03-20 ENCOUNTER — Inpatient Hospital Stay (HOSPITAL_COMMUNITY)
Admission: EM | Admit: 2018-03-20 | Discharge: 2018-03-24 | DRG: 291 | Disposition: A | Discharge status: Home / Self Care | Payer: Medicare HMO | Attending: Family Medicine | Admitting: Family Medicine

## 2018-03-20 DIAGNOSIS — I130 Hypertensive heart and chronic kidney disease with heart failure and stage 1 through stage 4 chronic kidney disease, or unspecified chronic kidney disease: Principal | ICD-10-CM | POA: Diagnosis present

## 2018-03-20 DIAGNOSIS — I5043 Acute on chronic combined systolic (congestive) and diastolic (congestive) heart failure: Secondary | ICD-10-CM | POA: Diagnosis present

## 2018-03-20 DIAGNOSIS — Z794 Long term (current) use of insulin: Secondary | ICD-10-CM

## 2018-03-20 DIAGNOSIS — N183 Chronic kidney disease, stage 3 unspecified: Secondary | ICD-10-CM

## 2018-03-20 DIAGNOSIS — Z6832 Body mass index (BMI) 32.0-32.9, adult: Secondary | ICD-10-CM

## 2018-03-20 DIAGNOSIS — I5033 Acute on chronic diastolic (congestive) heart failure: Secondary | ICD-10-CM | POA: Diagnosis present

## 2018-03-20 DIAGNOSIS — Z91018 Allergy to other foods: Secondary | ICD-10-CM | POA: Diagnosis not present

## 2018-03-20 DIAGNOSIS — Z888 Allergy status to other drugs, medicaments and biological substances status: Secondary | ICD-10-CM | POA: Diagnosis not present

## 2018-03-20 DIAGNOSIS — E118 Type 2 diabetes mellitus with unspecified complications: Secondary | ICD-10-CM | POA: Diagnosis not present

## 2018-03-20 DIAGNOSIS — E785 Hyperlipidemia, unspecified: Secondary | ICD-10-CM | POA: Diagnosis present

## 2018-03-20 DIAGNOSIS — I252 Old myocardial infarction: Secondary | ICD-10-CM

## 2018-03-20 DIAGNOSIS — E1140 Type 2 diabetes mellitus with diabetic neuropathy, unspecified: Secondary | ICD-10-CM

## 2018-03-20 DIAGNOSIS — E1165 Type 2 diabetes mellitus with hyperglycemia: Secondary | ICD-10-CM | POA: Diagnosis present

## 2018-03-20 DIAGNOSIS — Z885 Allergy status to narcotic agent status: Secondary | ICD-10-CM | POA: Diagnosis not present

## 2018-03-20 DIAGNOSIS — I5022 Chronic systolic (congestive) heart failure: Secondary | ICD-10-CM

## 2018-03-20 DIAGNOSIS — Z91040 Latex allergy status: Secondary | ICD-10-CM | POA: Diagnosis not present

## 2018-03-20 DIAGNOSIS — Z8673 Personal history of transient ischemic attack (TIA), and cerebral infarction without residual deficits: Secondary | ICD-10-CM

## 2018-03-20 DIAGNOSIS — Z823 Family history of stroke: Secondary | ICD-10-CM

## 2018-03-20 DIAGNOSIS — E669 Obesity, unspecified: Secondary | ICD-10-CM | POA: Diagnosis present

## 2018-03-20 DIAGNOSIS — I5020 Unspecified systolic (congestive) heart failure: Secondary | ICD-10-CM

## 2018-03-20 DIAGNOSIS — R0602 Shortness of breath: Secondary | ICD-10-CM | POA: Diagnosis not present

## 2018-03-20 DIAGNOSIS — J9621 Acute and chronic respiratory failure with hypoxia: Secondary | ICD-10-CM | POA: Diagnosis not present

## 2018-03-20 DIAGNOSIS — I10 Essential (primary) hypertension: Secondary | ICD-10-CM | POA: Diagnosis present

## 2018-03-20 DIAGNOSIS — E1122 Type 2 diabetes mellitus with diabetic chronic kidney disease: Secondary | ICD-10-CM | POA: Diagnosis present

## 2018-03-20 DIAGNOSIS — M109 Gout, unspecified: Secondary | ICD-10-CM | POA: Diagnosis present

## 2018-03-20 DIAGNOSIS — Z833 Family history of diabetes mellitus: Secondary | ICD-10-CM | POA: Diagnosis not present

## 2018-03-20 DIAGNOSIS — Z9049 Acquired absence of other specified parts of digestive tract: Secondary | ICD-10-CM

## 2018-03-20 DIAGNOSIS — E7849 Other hyperlipidemia: Secondary | ICD-10-CM | POA: Diagnosis not present

## 2018-03-20 DIAGNOSIS — E7800 Pure hypercholesterolemia, unspecified: Secondary | ICD-10-CM | POA: Diagnosis present

## 2018-03-20 DIAGNOSIS — Z7982 Long term (current) use of aspirin: Secondary | ICD-10-CM | POA: Diagnosis not present

## 2018-03-20 DIAGNOSIS — Z79899 Other long term (current) drug therapy: Secondary | ICD-10-CM | POA: Diagnosis not present

## 2018-03-20 DIAGNOSIS — M1A00X0 Idiopathic chronic gout, unspecified site, without tophus (tophi): Secondary | ICD-10-CM

## 2018-03-20 DIAGNOSIS — Z79891 Long term (current) use of opiate analgesic: Secondary | ICD-10-CM

## 2018-03-20 HISTORY — DX: Type 2 diabetes mellitus with diabetic neuropathy, unspecified: E11.40

## 2018-03-20 HISTORY — DX: Chronic kidney disease, stage 3 unspecified: N18.30

## 2018-03-20 HISTORY — DX: Acute and chronic respiratory failure with hypoxia: J96.21

## 2018-03-20 LAB — CBG MONITORING, ED
GLUCOSE-CAPILLARY: 252 mg/dL — AB (ref 70–99)
Glucose-Capillary: 252 mg/dL — ABNORMAL HIGH (ref 70–99)
Glucose-Capillary: 289 mg/dL — ABNORMAL HIGH (ref 70–99)

## 2018-03-20 LAB — CBC WITH DIFFERENTIAL/PLATELET
ABS IMMATURE GRANULOCYTES: 0.02 10*3/uL (ref 0.00–0.07)
Abs Immature Granulocytes: 0.02 10*3/uL (ref 0.00–0.07)
BASOS ABS: 0 10*3/uL (ref 0.0–0.1)
Basophils Absolute: 0 10*3/uL (ref 0.0–0.1)
Basophils Relative: 0 %
Basophils Relative: 0 %
EOS ABS: 0.1 10*3/uL (ref 0.0–0.5)
Eosinophils Absolute: 0.1 10*3/uL (ref 0.0–0.5)
Eosinophils Relative: 1 %
Eosinophils Relative: 1 %
HCT: 33.5 % — ABNORMAL LOW (ref 36.0–46.0)
HEMATOCRIT: 34.2 % — AB (ref 36.0–46.0)
Hemoglobin: 11 g/dL — ABNORMAL LOW (ref 12.0–15.0)
Hemoglobin: 10.8 g/dL — ABNORMAL LOW (ref 12.0–15.0)
IMMATURE GRANULOCYTES: 0 %
Immature Granulocytes: 0 %
LYMPHS ABS: 0.8 10*3/uL (ref 0.7–4.0)
Lymphocytes Relative: 12 %
Lymphocytes Relative: 16 %
Lymphs Abs: 1 10*3/uL (ref 0.7–4.0)
MCH: 27.4 pg (ref 26.0–34.0)
MCH: 27.6 pg (ref 26.0–34.0)
MCHC: 32.2 g/dL (ref 30.0–36.0)
MCHC: 32.2 g/dL (ref 30.0–36.0)
MCV: 85.1 fL (ref 80.0–100.0)
MCV: 85.5 fL (ref 80.0–100.0)
Monocytes Absolute: 0.6 10*3/uL (ref 0.1–1.0)
Monocytes Absolute: 0.5 10*3/uL (ref 0.1–1.0)
Monocytes Relative: 8 %
Monocytes Relative: 8 %
NEUTROS PCT: 79 %
NRBC: 0 % (ref 0.0–0.2)
Neutro Abs: 5.7 10*3/uL (ref 1.7–7.7)
Neutro Abs: 4.6 10*3/uL (ref 1.7–7.7)
Neutrophils Relative %: 75 %
PLATELETS: 238 10*3/uL (ref 150–400)
Platelets: 223 10*3/uL (ref 150–400)
RBC: 4.02 MIL/uL (ref 3.87–5.11)
RBC: 3.92 MIL/uL (ref 3.87–5.11)
RDW: 14 % (ref 11.5–15.5)
RDW: 14.1 % (ref 11.5–15.5)
WBC: 7.2 10*3/uL (ref 4.0–10.5)
WBC: 6.3 10*3/uL (ref 4.0–10.5)
nRBC: 0 % (ref 0.0–0.2)

## 2018-03-20 LAB — BASIC METABOLIC PANEL
ANION GAP: 8 (ref 5–15)
BUN: 27 mg/dL — ABNORMAL HIGH (ref 8–23)
CALCIUM: 8.6 mg/dL — AB (ref 8.9–10.3)
CO2: 22 mmol/L (ref 22–32)
Chloride: 104 mmol/L (ref 98–111)
Creatinine, Ser: 1.17 mg/dL — ABNORMAL HIGH (ref 0.44–1.00)
GFR, EST AFRICAN AMERICAN: 57 mL/min — AB (ref >60)
GFR, EST NON AFRICAN AMERICAN: 50 mL/min — AB (ref >60)
Glucose, Bld: 313 mg/dL — ABNORMAL HIGH (ref 70–99)
POTASSIUM: 3.6 mmol/L (ref 3.5–5.1)
Sodium: 134 mmol/L — ABNORMAL LOW (ref 135–145)

## 2018-03-20 LAB — I-STAT TROPONIN, ED: Troponin i, poc: 0.01 ng/mL (ref 0.00–0.08)

## 2018-03-20 LAB — TROPONIN I
Troponin I: <0.03 ng/mL (ref <0.03)
Troponin I: <0.03 ng/mL (ref <0.03)

## 2018-03-20 LAB — GLUCOSE, CAPILLARY: Glucose-Capillary: 238 mg/dL — ABNORMAL HIGH (ref 70–99)

## 2018-03-20 LAB — BRAIN NATRIURETIC PEPTIDE: B NATRIURETIC PEPTIDE 5: 218.7 pg/mL — AB (ref 0.0–100.0)

## 2018-03-20 MED ORDER — ACETAMINOPHEN 325 MG PO TABS
650.0000 mg | ORAL_TABLET | ORAL | Status: DC | PRN
  Administered 2018-03-20 – 2018-03-24 (×5): 650 mg via ORAL
  Filled 2018-03-20 (×5): qty 2

## 2018-03-20 MED ORDER — ENOXAPARIN SODIUM 40 MG/0.4ML ~~LOC~~ SOLN
40.0000 mg | SUBCUTANEOUS | Status: DC
  Administered 2018-03-20 – 2018-03-24 (×5): 40 mg via SUBCUTANEOUS
  Filled 2018-03-20 (×5): qty 0.4

## 2018-03-20 MED ORDER — FUROSEMIDE 10 MG/ML IJ SOLN
40.0000 mg | Freq: Two times a day (BID) | INTRAMUSCULAR | Status: DC
  Administered 2018-03-20 – 2018-03-24 (×9): 40 mg via INTRAVENOUS
  Filled 2018-03-20 (×9): qty 4

## 2018-03-20 MED ORDER — ALLOPURINOL 300 MG PO TABS
300.0000 mg | ORAL_TABLET | Freq: Every day | ORAL | Status: DC
  Administered 2018-03-20 – 2018-03-24 (×5): 300 mg via ORAL
  Filled 2018-03-20 (×5): qty 1

## 2018-03-20 MED ORDER — TIZANIDINE HCL 4 MG PO TABS: 4.0000 mg | ORAL_TABLET | Freq: Three times a day (TID) | ORAL | Status: DC | PRN

## 2018-03-20 MED ORDER — FUROSEMIDE 10 MG/ML IJ SOLN
40.0000 mg | Freq: Once | INTRAMUSCULAR | Status: AC
  Administered 2018-03-20: 40 mg via INTRAVENOUS
  Filled 2018-03-20: qty 4

## 2018-03-20 MED ORDER — ONDANSETRON HCL 4 MG/2ML IJ SOLN: 4.0000 mg | Freq: Four times a day (QID) | INTRAMUSCULAR | Status: DC | PRN

## 2018-03-20 MED ORDER — ALBUTEROL SULFATE (2.5 MG/3ML) 0.083% IN NEBU
5.0000 mg | INHALATION_SOLUTION | Freq: Once | RESPIRATORY_TRACT | Status: AC
  Administered 2018-03-20: 5 mg via RESPIRATORY_TRACT
  Filled 2018-03-20: qty 6

## 2018-03-20 MED ORDER — CARVEDILOL 12.5 MG PO TABS
12.5000 mg | ORAL_TABLET | Freq: Two times a day (BID) | ORAL | Status: DC
  Administered 2018-03-20 – 2018-03-24 (×9): 12.5 mg via ORAL
  Filled 2018-03-20 (×9): qty 1

## 2018-03-20 MED ORDER — FUROSEMIDE 10 MG/ML IJ SOLN
80.0000 mg | Freq: Once | INTRAMUSCULAR | Status: AC
  Administered 2018-03-20: 80 mg via INTRAVENOUS
  Filled 2018-03-20: qty 8

## 2018-03-20 MED ORDER — INSULIN GLARGINE 100 UNIT/ML ~~LOC~~ SOLN
35.0000 [IU] | Freq: Every day | SUBCUTANEOUS | Status: DC
  Administered 2018-03-20 – 2018-03-23 (×4): 35 [IU] via SUBCUTANEOUS
  Filled 2018-03-20 (×6): qty 0.35

## 2018-03-20 MED ORDER — PANTOPRAZOLE SODIUM 20 MG PO TBEC
20.0000 mg | DELAYED_RELEASE_TABLET | Freq: Every day | ORAL | Status: DC
  Administered 2018-03-20 – 2018-03-24 (×5): 20 mg via ORAL
  Filled 2018-03-20 (×5): qty 1

## 2018-03-20 MED ORDER — SODIUM CHLORIDE 0.9% FLUSH: 3.0000 mL | INTRAVENOUS | Status: DC | PRN

## 2018-03-20 MED ORDER — AMLODIPINE BESYLATE 5 MG PO TABS
5.0000 mg | ORAL_TABLET | Freq: Every day | ORAL | Status: DC
  Administered 2018-03-20 – 2018-03-23 (×4): 5 mg via ORAL
  Filled 2018-03-20 (×4): qty 1

## 2018-03-20 MED ORDER — INSULIN ASPART 100 UNIT/ML ~~LOC~~ SOLN
0.0000 [IU] | Freq: Three times a day (TID) | SUBCUTANEOUS | Status: DC
  Administered 2018-03-20 – 2018-03-21 (×4): 5 [IU] via SUBCUTANEOUS
  Administered 2018-03-21: 7 [IU] via SUBCUTANEOUS
  Administered 2018-03-21: 2 [IU] via SUBCUTANEOUS
  Administered 2018-03-22 (×2): 3 [IU] via SUBCUTANEOUS
  Administered 2018-03-22: 2 [IU] via SUBCUTANEOUS
  Administered 2018-03-23 – 2018-03-24 (×3): 1 [IU] via SUBCUTANEOUS
  Filled 2018-03-20 (×3): qty 1

## 2018-03-20 MED ORDER — ASPIRIN EC 81 MG PO TBEC
81.0000 mg | DELAYED_RELEASE_TABLET | Freq: Every day | ORAL | Status: DC
  Administered 2018-03-20 – 2018-03-24 (×5): 81 mg via ORAL
  Filled 2018-03-20 (×5): qty 1

## 2018-03-20 MED ORDER — SODIUM CHLORIDE 0.9% FLUSH
3.0000 mL | Freq: Two times a day (BID) | INTRAVENOUS | Status: DC
  Administered 2018-03-20 – 2018-03-24 (×8): 3 mL via INTRAVENOUS

## 2018-03-20 MED ORDER — GABAPENTIN 300 MG PO CAPS
300.0000 mg | ORAL_CAPSULE | Freq: Two times a day (BID) | ORAL | Status: DC
  Administered 2018-03-20 – 2018-03-24 (×9): 300 mg via ORAL
  Filled 2018-03-20 (×9): qty 1

## 2018-03-20 MED ORDER — SODIUM CHLORIDE 0.9 % IV SOLN: 250.0000 mL | INTRAVENOUS | Status: DC | PRN

## 2018-03-20 NOTE — ED Notes (Signed)
Bed: SJ29 Expected date:  Expected time:  Means of arrival:  Comments: EMS 62 yo female SOB/fever-has pneumonia-CHF

## 2018-03-20 NOTE — ED Provider Notes (Signed)
[facility] DEPT Provider Note   CSN: [id number] Arrival date & time: 03/20/18  0021     History   Chief Complaint Chief Complaint  Patient presents with  . Shortness of Breath    HPI Kara Hanson is a 62 y.o. female.  62 year old female with history of CHF and MI presents with increasing shortness of breath times several days similar to her prior CHF exacerbations.  She has had nonproductive cough without fever or chills.  No substernal chest pain or chest pressure.  Notes increased lower extremity edema as well as increased weight gain.  Called EMS and was given albuterol with limited improvement.  No vomiting or diarrhea.  Denies any palpitations.  Has been compliant with her medications     Past Medical History:  Diagnosis Date  . CHF (congestive heart failure) ([place])   . CVA (cerebral vascular accident) ([place])   . Diabetes mellitus without complication ([place])   . Hypercholesteremia   . Hypertension   . Myocardial infarction ([place])   . Spinal stenosis     Patient Active Problem List   Diagnosis Date Noted  . Acute on chronic diastolic CHF (congestive heart failure) ([place]) 11/27/2017  . Acute kidney injury ([place]) 11/27/2017  . Acute respiratory distress 11/27/2017  . Bulging lumbar disc 11/14/2017  . Gout 11/14/2017  . HLD (hyperlipidemia) 09/30/2016  . Chest pain 06/17/2016  . Stress-induced cardiomyopathy 05/19/2016  . Type 2 diabetes mellitus with diabetic neuropathy, unspecified ([place]) 05/06/2016  . Vertigo 05/06/2016  . Controlled type 2 diabetes mellitus with hyperglycemia ([place]) 04/23/2016  . Hypertension 04/23/2016  . Normochromic normocytic anemia 04/23/2016  . Syncope 04/23/2016    Past Surgical History:  Procedure Laterality Date  . BLADDER SURGERY    . CARDIAC CATHETERIZATION N/A 04/25/2016   Procedure: Left Heart Cath and Coronary Angiography;  Surgeon: Lorretta Harp, MD;  Location: [facility];  Service:  Cardiovascular;  Laterality: N/A;  . CESAREAN SECTION    . CHOLECYSTECTOMY       OB History   No obstetric history on file.      Home Medications    Prior to Admission medications   Medication Sig Start Date End Date Taking? Authorizing Provider  allopurinol (ZYLOPRIM) 300 MG tablet Take 1 tablet (300 mg total) by mouth daily. 12/01/17   [name], MD  aspirin EC 81 MG tablet Take 81 mg by mouth daily.    [provider]  atorvastatin (LIPITOR) 20 MG tablet Take 1 tablet (20 mg total) by mouth daily. Patient not taking: Reported on 01/02/2018 11/14/17   [name], MD  Blood Glucose Monitoring Suppl (ONE TOUCH ULTRA MINI) w/Device KIT UAD UP TO THREE TIMES DAILY DX E11.4 11/20/17   [name], MD  carvedilol (COREG) 12.5 MG tablet Take 1 tablet (12.5 mg total) by mouth 2 (two) times daily with a meal. 11/14/17   [name], MD  dicyclomine (BENTYL) 20 MG tablet Take 1 tablet (20 mg total) by mouth 2 (two) times daily. 01/02/18   Maudie Flakes, MD  furosemide (LASIX) 40 MG tablet Take 1 tablet (40 mg total) by mouth 2 (two) times daily. Patient taking differently: Take 40 mg by mouth daily.  12/13/17   [name], MD  gabapentin (NEURONTIN) 300 MG capsule Take 2 capsules (600 mg total) by mouth 2 (two) times daily. 11/14/17   Newlin, Charlane Ferretti, MD  glucose blood (ONE TOUCH ULTRA TEST) test strip UAD UP TO THREE TIMES DAILY  DX E11.4 11/20/17   [name], MD  insulin aspart (NOVOLOG) 100 UNIT/ML injection 0 to 12 units subcutaneously 3 times daily before meals as per sliding scale 02/28/18   [name], MD  Insulin Glargine (BASAGLAR KWIKPEN) 100 UNIT/ML SOPN Inject 35 units into the skin daily. 02/28/18   [name], MD  Insulin Pen Needle 31G X 5 MM MISC 1 each by Does not apply route at bedtime. 12/13/17   [name], MD  Lancet Device MISC Use as instructed 3 times daily 09/19/17   Rodell Perna A, PA-C  lansoprazole (PREVACID) 15 MG capsule  Take 1 capsule (15 mg total) by mouth daily. 12/13/17   [name], MD  lisinopril (PRINIVIL,ZESTRIL) 10 MG tablet Take 1 tablet (10 mg total) by mouth daily. Patient not taking: Reported on 01/02/2018 12/13/17   [name], MD  meclizine (ANTIVERT) 25 MG tablet Take 1 tablet (25 mg total) by mouth 2 (two) times daily as needed. Patient taking differently: Take 25 mg by mouth 2 (two) times daily as needed (vertigo).  05/06/16   [name], MD  metoCLOPramide (REGLAN) 5 MG tablet Take 1 tablet (5 mg total) by mouth 3 (three) times daily before meals. 11/14/17   [name], MD  ONETOUCH DELICA LANCETS 90W MISC UAD UP TO THREE TIMES DAILY DX E11.4 11/20/17   [name], MD  sucralfate (CARAFATE) 1 g tablet Take 1 tablet (1 g total) by mouth 4 (four) times daily as needed. 01/02/18   Maudie Flakes, MD  SUPER B COMPLEX/C CAPS Take 1 capsule by mouth daily.    [provider]  tiZANidine (ZANAFLEX) 4 MG tablet Take 1 tablet (4 mg total) by mouth every 8 (eight) hours as needed for muscle spasms. 11/14/17   [name], MD  traMADol (ULTRAM) 50 MG tablet Take 1 tablet (50 mg total) by mouth every 12 (twelve) hours as needed. 12/13/17   [name], MD    Family History Family History  Problem Relation Age of Onset  . Diabetes Mellitus II Father   . Stroke Father     Social History Social History   Tobacco Use  . Smoking status: Never Smoker  . Smokeless tobacco: Never Used  Substance Use Topics  . Alcohol use: No  . Drug use: No     Allergies   Garlic; Latex; Morphine and related; and Other   Review of Systems Review of Systems  All other systems reviewed and are negative.    Physical Exam Updated Vital Signs BP (!) 157/97 (BP Location: Right Arm)   Pulse 91   Temp 98.5 F (36.9 C) (Oral)   Resp 17   Ht 1.499 m ('4\' 11"'$ )   Wt 72.6 kg   SpO2 99%   BMI 32.32 kg/m   Physical Exam Vitals signs and nursing note reviewed.  Constitutional:        General: She is not in acute distress.    Appearance: Normal appearance. She is well-developed. She is not toxic-appearing.  HENT:     Head: Normocephalic and atraumatic.  Eyes:     General: Lids are normal.     Conjunctiva/sclera: Conjunctivae normal.     Pupils: Pupils are equal, round, and reactive to light.  Neck:     Musculoskeletal: Normal range of motion and neck supple.     Thyroid: No thyroid mass.     Trachea: No tracheal deviation.  Cardiovascular:     Rate and Rhythm: Normal rate and regular rhythm.  Heart sounds: Normal heart sounds. No murmur. No gallop.   Pulmonary:     Effort: Pulmonary effort is normal. No respiratory distress.     Breath sounds: No stridor. Examination of the right-lower field reveals decreased breath sounds and rales. Examination of the left-lower field reveals decreased breath sounds and rales. Decreased breath sounds and rales present. No wheezing or rhonchi.  Abdominal:     General: Bowel sounds are normal. There is no distension.     Palpations: Abdomen is soft.     Tenderness: There is no abdominal tenderness. There is no rebound.  Musculoskeletal: Normal range of motion.        General: No tenderness.  Lymphadenopathy:     Comments: 2+ bilateral lower extremity pitting edema  Skin:    General: Skin is warm and dry.     Findings: No abrasion or rash.  Neurological:     Mental Status: She is alert and oriented to person, place, and time.     GCS: GCS eye subscore is 4. GCS verbal subscore is 5. GCS motor subscore is 6.     Cranial Nerves: No cranial nerve deficit.     Sensory: No sensory deficit.  Psychiatric:        Speech: Speech normal.        Behavior: Behavior normal.      ED Treatments / Results  Labs (all labs ordered are listed, but only abnormal results are displayed) Labs Reviewed  CBC WITH DIFFERENTIAL/PLATELET  BASIC METABOLIC PANEL  BRAIN NATRIURETIC PEPTIDE  I-STAT TROPONIN, ED    EKG EKG  Interpretation  Date/Time:  Tuesday March 20 2018 00:34:22 EST Ventricular Rate:  84 PR Interval:    QRS Duration: 91 QT Interval:  385 QTC Calculation: 456 R Axis:   54 Text Interpretation:  Sinus rhythm Low voltage, extremity leads Abnormal T, consider ischemia, lateral leads Confirmed by Lacretia Leigh ([id number]) on 03/20/2018 12:44:39 AM   Radiology No results found.  Procedures Procedures (including critical care time)  Medications Ordered in ED Medications  albuterol (PROVENTIL) (2.5 MG/3ML) 0.083% nebulizer solution 5 mg (has no administration in time range)     Initial Impression / Assessment and Plan / ED Course  I have reviewed the triage vital signs and the nursing notes.  Pertinent labs & imaging results that were available during my care of the patient were reviewed by me and considered in my medical decision making (see chart for details).     Patient given Lasix 80 mg IV push for evidence of CHF.  Will consult hospitalist for management  Final Clinical Impressions(s) / ED Diagnoses   Final diagnoses:  None    ED Discharge Orders    None       Lacretia Leigh, MD 03/20/18 0231

## 2018-03-20 NOTE — H&P (Addendum)
History and Physical    Kara Hanson OVF:[id number] DOB: [birth year] DOA: 03/20/2018  Referring MD/NP/PA: Lacretia Leigh, MD PCP: [name], MD  Patient coming from: home via EMS  Chief Complaint: Shortness of breath  I have personally briefly reviewed patient's old medical records in [place]   HPI: Kara Hanson is a 62 y.o. female with medical history significant of HTN, HLD, diastolic CHF, CVA, diabetes mellitus type 2; who presents with complaints of shortness of breath that acutely worsened yesterday.  She has been having issues with lower extremity leg swelling and acutely noted that her weight was up 5 pounds when she checked yesterday.  She does not report any recent changes in her medications. Other associated symptoms include nonproductive cough, orthopnea, dyspnea with exertion, back pain, and leg weakness.  She is not currently followed by a cardiologist.  Patient is not on oxygen and feels that symptoms are similar to previous congestive heart failure exacerbation back in August of this year.  Patient reports that her daughter found her and she was screaming to her but she was not responding appropriately and therefore called EMS.  ED Course: Upon admission to the emergency department patient was noted to be afebrile, respirations 17-22, blood pressures 142/68 - 157/97, and O2 saturations as low as 86% on room air.  Patient was placed on 2 L nasal cannula oxygen with improvement O2 saturations.  Labs revealed hemoglobin 11, potassium 134, BUN 27, creatinine 1.17, glucose 313, and BNP 218.7.  Chest x-ray showing cardiomegaly with small pleural effusions and signs of pulmonary edema.  Patient was given 80 mg of Lasix and albuterol breathing treatment.  TRH called to admit for congestive heart failure exacerbation..   Review of Systems  Constitutional: Positive for malaise/fatigue. Negative for chills and fever.  HENT: Negative for nosebleeds and sinus pain.   Eyes:  Negative for photophobia and pain.  Respiratory: Positive for cough and shortness of breath. Negative for sputum production.   Cardiovascular: Positive for chest pain, orthopnea and leg swelling.  Gastrointestinal: Negative for abdominal pain, nausea and vomiting.  Genitourinary: Negative for dysuria and frequency.  Musculoskeletal: Positive for back pain and myalgias.  Neurological: Positive for weakness. Negative for focal weakness.  Psychiatric/Behavioral: Negative for substance abuse and suicidal ideas.    Past Medical History:  Diagnosis Date  . CHF (congestive heart failure) ([place])   . CVA (cerebral vascular accident) ([place])   . Diabetes mellitus without complication ([place])   . Hypercholesteremia   . Hypertension   . Myocardial infarction ([place])   . Spinal stenosis     Past Surgical History:  Procedure Laterality Date  . BLADDER SURGERY    . CARDIAC CATHETERIZATION N/A 04/25/2016   Procedure: Left Heart Cath and Coronary Angiography;  Surgeon: Lorretta Harp, MD;  Location: [facility];  Service: Cardiovascular;  Laterality: N/A;  . CESAREAN SECTION    . CHOLECYSTECTOMY       reports that she has never smoked. She has never used smokeless tobacco. She reports that she does not drink alcohol or use drugs.  Allergies  Allergen Reactions  . Garlic Shortness Of Breath, Itching and Swelling    Hand itching and swelling  . Latex Itching  . Morphine And Related Itching and Other (See Comments)    Headache   . Other Itching    Reaction to newspaper ink -itching and headache    Family History  Problem Relation Age of Onset  . Diabetes Mellitus II Father   .  Stroke Father     Prior to Admission medications   Medication Sig Start Date End Date Taking? Authorizing Provider  allopurinol (ZYLOPRIM) 300 MG tablet Take 1 tablet (300 mg total) by mouth daily. 12/01/17  Yes [name], MD  aspirin EC 81 MG tablet Take 81 mg by mouth daily.   Yes [provider]  Blood Glucose Monitoring Suppl (ONE TOUCH ULTRA MINI) w/Device KIT UAD UP TO THREE TIMES DAILY DX E11.4 11/20/17  Yes [name], MD  carvedilol (COREG) 12.5 MG tablet Take 1 tablet (12.5 mg total) by mouth 2 (two) times daily with a meal. 11/14/17  Yes Newlin, Enobong, MD  furosemide (LASIX) 40 MG tablet Take 1 tablet (40 mg total) by mouth 2 (two) times daily. Patient taking differently: Take 40 mg by mouth daily.  12/13/17  Yes [name], MD  gabapentin (NEURONTIN) 300 MG capsule Take 2 capsules (600 mg total) by mouth 2 (two) times daily. Patient taking differently: Take 300 mg by mouth 2 (two) times daily.  11/14/17  Yes Newlin, Enobong, MD  glucose blood (ONE TOUCH ULTRA TEST) test strip UAD UP TO THREE TIMES DAILY DX E11.4 11/20/17  Yes [name], MD  insulin aspart (NOVOLOG) 100 UNIT/ML injection 0 to 12 units subcutaneously 3 times daily before meals as per sliding scale 02/28/18  Yes Newlin, Enobong, MD  Insulin Glargine (BASAGLAR KWIKPEN) 100 UNIT/ML SOPN Inject 35 units into the skin daily. Patient taking differently: Inject 35 Units into the skin at bedtime.  02/28/18  Yes Newlin, Charlane Ferretti, MD  Insulin Pen Needle 31G X 5 MM MISC 1 each by Does not apply route at bedtime. 12/13/17  Yes [name], MD  Lancet Device MISC Use as instructed 3 times daily 09/19/17  Yes Fawze, Mina A, PA-C  lansoprazole (PREVACID) 15 MG capsule Take 1 capsule (15 mg total) by mouth daily. 12/13/17  Yes [name], MD  meclizine (ANTIVERT) 25 MG tablet Take 1 tablet (25 mg total) by mouth 2 (two) times daily as needed. Patient taking differently: Take 25 mg by mouth 2 (two) times daily as needed (vertigo).  05/06/16  Yes [name], MD  ONETOUCH DELICA LANCETS 41O MISC UAD UP TO THREE TIMES DAILY DX E11.4 11/20/17  Yes [name], MD  SUPER B COMPLEX/C CAPS Take 1 capsule by mouth daily.   Yes [provider]  tiZANidine (ZANAFLEX) 4 MG tablet Take 1 tablet (4 mg total)  by mouth every 8 (eight) hours as needed for muscle spasms. 11/14/17  Yes [name], MD  atorvastatin (LIPITOR) 20 MG tablet Take 1 tablet (20 mg total) by mouth daily. Patient not taking: Reported on 01/02/2018 11/14/17   [name], MD  dicyclomine (BENTYL) 20 MG tablet Take 1 tablet (20 mg total) by mouth 2 (two) times daily. Patient not taking: Reported on 03/20/2018 01/02/18   Maudie Flakes, MD  lisinopril (PRINIVIL,ZESTRIL) 10 MG tablet Take 1 tablet (10 mg total) by mouth daily. Patient not taking: Reported on 01/02/2018 12/13/17   [name], MD  metoCLOPramide (REGLAN) 5 MG tablet Take 1 tablet (5 mg total) by mouth 3 (three) times daily before meals. Patient not taking: Reported on 03/20/2018 11/14/17   [name], MD  sucralfate (CARAFATE) 1 g tablet Take 1 tablet (1 g total) by mouth 4 (four) times daily as needed. Patient not taking: Reported on 03/20/2018 01/02/18   Maudie Flakes, MD  traMADol (ULTRAM) 50 MG tablet Take 1 tablet (50 mg total)  by mouth every 12 (twelve) hours as needed. Patient not taking: Reported on 03/20/2018 12/13/17   [name], MD    Physical Exam:  Constitutional: Female who appears to be in moderate respiratory discomfort Vitals:   03/20/18 0035 03/20/18 0131 03/20/18 0230 03/20/18 0300  BP:   (!) 153/74 (!) 142/68  Pulse:   78 78  Resp:   (!) 21 (!) 22  Temp:      TempSrc:      SpO2: 99% 93% 96% 94%  Weight:      Height:       Eyes: PERRL, lids and conjunctivae normal ENMT: Mucous membranes are moist. Posterior pharynx clear of any exudate or lesions.Normal dentition.  Neck: normal, supple, no masses, no thyromegaly.  Positive JVD. Respiratory: Tachypneic with decreased aeration and positive crackles appreciated. Cardiovascular: Regular rate and rhythm, no murmurs / rubs / gallops.  At least 1+ pitting lower extremity edema. 2+ pedal pulses. No carotid bruits.  Abdomen: no tenderness, no masses palpated. No  hepatosplenomegaly. Bowel sounds positive.  Musculoskeletal: no clubbing / cyanosis. No joint deformity upper and lower extremities. Good ROM, no contractures. Normal muscle tone.  Skin: no rashes, lesions, ulcers. No induration Neurologic: CN 2-12 grossly intact. Sensation intact, DTR normal. Strength 5/5 in all 4.  Psychiatric: Normal judgment and insight. Alert and oriented x 3. Normal mood.     Labs on Admission: I have personally reviewed following labs and imaging studies  CBC: Recent Labs  Lab 03/20/18 0054  WBC 7.2  NEUTROABS 5.7  HGB 11.0*  HCT 34.2*  MCV 85.1  PLT 155   Basic Metabolic Panel: Recent Labs  Lab 03/20/18 0054  NA 134*  K 3.6  CL 104  CO2 22  GLUCOSE 313*  BUN 27*  CREATININE 1.17*  CALCIUM 8.6*   GFR: Estimated Creatinine Clearance: 42.7 mL/min (A) (by C-G formula based on SCr of 1.17 mg/dL (H)). Liver Function Tests: No results for input(s): AST, ALT, ALKPHOS, BILITOT, PROT, ALBUMIN in the last 168 hours. No results for input(s): LIPASE, AMYLASE in the last 168 hours. No results for input(s): AMMONIA in the last 168 hours. Coagulation Profile: No results for input(s): INR, PROTIME in the last 168 hours. Cardiac Enzymes: No results for input(s): CKTOTAL, CKMB, CKMBINDEX, TROPONINI in the last 168 hours. BNP (last 3 results) No results for input(s): PROBNP in the last 8760 hours. HbA1C: No results for input(s): HGBA1C in the last 72 hours. CBG: No results for input(s): GLUCAP in the last 168 hours. Lipid Profile: No results for input(s): CHOL, HDL, LDLCALC, TRIG, CHOLHDL, LDLDIRECT in the last 72 hours. Thyroid Function Tests: No results for input(s): TSH, T4TOTAL, FREET4, T3FREE, THYROIDAB in the last 72 hours. Anemia Panel: No results for input(s): VITAMINB12, FOLATE, FERRITIN, TIBC, IRON, RETICCTPCT in the last 72 hours. Urine analysis:    Component Value Date/Time   COLORURINE YELLOW 01/02/2018 1452   APPEARANCEUR HAZY (A)  01/02/2018 1452   LABSPEC 1.039 (H) 01/02/2018 1452   PHURINE 6.0 01/02/2018 1452   GLUCOSEU >=500 (A) 01/02/2018 1452   HGBUR NEGATIVE 01/02/2018 1452   BILIRUBINUR NEGATIVE 01/02/2018 1452   BILIRUBINUR negative 12/13/2017 1514   KETONESUR NEGATIVE 01/02/2018 1452   PROTEINUR >=300 (A) 01/02/2018 1452   UROBILINOGEN 0.2 12/13/2017 1514   NITRITE NEGATIVE 01/02/2018 1452   LEUKOCYTESUR NEGATIVE 01/02/2018 1452   Sepsis Labs: No results found for this or any previous visit (from the past 240 hour(s)).   Radiological Exams on Admission: Dg  Chest 2 View  Result Date: 03/20/2018 CLINICAL DATA:  Shortness of breath. EXAM: CHEST - 2 VIEW COMPARISON:  Radiograph 01/02/2018 FINDINGS: Mild cardiomegaly is similar. Peribronchial thickening is suspicious for pulmonary edema. Small pleural effusions. No focal airspace disease. No pneumothorax. Degenerative change in the spine. IMPRESSION: Findings suggesting CHF. Mild cardiomegaly and small pleural effusions. Peribronchial thickening is suspicious for pulmonary edema. Electronically Signed   By: Keith Rake M.D.   On: 03/20/2018 01:59    EKG: Independently reviewed.  Sinus rhythm 84 bpm  Assessment/Plan Respiratory failure with hypoxia 2/2 diastolic congestive heart failure exacerbation: Acute on chronic.  Patient presents with progressively worsening shortness of breath.  Found to have physical exam findings consistent with CHF chest x-ray showing cardiomegaly with pulmonary edema.  BNP elevated greater than previous 218.7.  Patient was initially given 80 mg of Lasix IV.  Last echocardiogram noted EF of 55 to 60% with grade 1 diastolic dysfunction in August. - Admit to a telemetry bed - Heart failure orders set  initiated  - Continuous pulse oximetry with nasal cannula oxygen as needed to keep O2 saturations >92% - Strict I&Os and daily weights - Elevate lower extremities - Lasix 40 mg IV bid - Reassess in a.m. and adjust diuresis as  needed. - Recheck echocardiogram if warranted by cardiology - Message sent for cardiology to to evaluate in a.m.  Essential hypertension - Continue Coreg  Diabetes mellitus type 2: Uncontrolled.  Presents with elevated blood glucose of 313 on admission.  Her last hemoglobin A1c noted to be 9.6 on 11/27/2017.  - Hypoglycemic protocol - Continue home regimen of Lantus - CBGs q. before meals   with moderate sliding scale insulin  Chronic kidney disease stage III: Resents with a creatinine of 1.17 on admission which appears improved from previous admission. - Continue to monitor   DVT prophylaxis: Lovenox Code Status: full Family Communication: No family present at bedside Disposition Plan: Likely discharge home in 1 to 3 days Consults called: none  Admission status: Inpatient  Norval Morton MD [facility] Pager [phone]   If 7PM-7AM, please contact night-coverage [email or web address] Password [facility]  03/20/2018, 3:49 AM

## 2018-03-20 NOTE — Consult Note (Addendum)
Cardiology Consultation:   Patient ID: Kara Hanson; [id number]; 03/12/1956   Admit date: 03/20/2018 Date of Consult: 03/20/2018  Primary Care Provider: [name], MD Primary Cardiologist: Candee Furbish, MD Primary Electrophysiologist:  None   Patient Profile:   Kara Hanson is a 62 y.o. female with a PMH of chronic diastolic CHF, stress induced cardiomyopathy, HTN, HLD, DM type 2, and history of CVA who is being seen today for the evaluation of acute on chronic diastolic CHF at the request of Dr. Jonelle Sidle.  History of Present Illness:   Ms. Kara Hanson was in her usual state of health until yesterday afternoon when she experienced acutely worsening SOB. She reported having a non-productive cough, orthopnea, PND, and worsening LE edema. She has intermittent atypical chest pains but no significant chest pressure/pain over the past week. Per patient, her daughter came home from work yesterday evening and found her with significant SOB and ?unresponsive so EMS was activated and patient was transported to [facility] ED.   She was last seen [facility] cardiology in consultation during an admission for chest pain 06/2016. She underwent an echocardiogram at that time to rule out recurrent Takotsubo syndrome and her EF was 55-60%. Since that time she has had multiple admissions and ER visits for chest pain and CHF. Her last echocardiogram 11/2017 showed EF 55-60%, normal wall motion, G1DD, and mild AS.  At the time of this evaluation she reports some improvement in breathing but is not back to baseline. She reports progressive worsening of her chronic DOE for the past 5 months. No significant life stressors recently. She splits her time between her children in [place] and her 100 yo mother in [place]. She reports seeing a cardiologist in [place] 08/2017 but has not followed back up with [facility] since returning to [facility]. She reported being taken off losartan given kidney dysfunction.   Hospital course: BP  persistently elevated, intermittently tachypneic, hypoxic to 86% on RA which improved with O2 via [place], afebrile. Labs notable for K 3.6, Cr 1.17 (baseline), Hgb 11.0, PLT 238, BNP 218, Trop negative x2. CXR with cardiomegaly, small pleural effusions, and pulmonary edema. EKG with sinus rhythm with lateral biphasic T-waves (seen on prior EKG), no STE/D. She was give IV lasix 20m x1 in the ED. Admitted to medicine and started on IV lasix 475mBID. Cardiology asked to evaluate for acute on chronic diastolic CHF.  Past Medical History:  Diagnosis Date  . CHF (congestive heart failure) ([facility]  . CVA (cerebral vascular accident) ([facility]  . Diabetes mellitus without complication ([facility]  . Hypercholesteremia   . Hypertension   . Myocardial infarction ([facility]  . Spinal stenosis     Past Surgical History:  Procedure Laterality Date  . BLADDER SURGERY    . CARDIAC CATHETERIZATION N/A 04/25/2016   Procedure: Left Heart Cath and Coronary Angiography;  Surgeon: JoLorretta HarpMD;  [facility];  Service: Cardiovascular;  Laterality: N/A;  . CESAREAN SECTION    . CHOLECYSTECTOMY       Home Medications:  Prior to Admission medications   Medication Sig Start Date End Date Taking? Authorizing Provider  allopurinol (ZYLOPRIM) 300 MG tablet Take 1 tablet (300 mg total) by mouth daily. 12/01/17  Yes NeCharlott RakesMD  aspirin EC 81 MG tablet Take 81 mg by mouth daily.   Yes [provider]  Blood Glucose Monitoring Suppl (ONE TOUCH ULTRA MINI) w/Device KIT UAD UP TO THREE TIMES DAILY DX E11.4  11/20/17  Yes [name], MD  carvedilol (COREG) 12.5 MG tablet Take 1 tablet (12.5 mg total) by mouth 2 (two) times daily with a meal. 11/14/17  Yes Newlin, Enobong, MD  furosemide (LASIX) 40 MG tablet Take 1 tablet (40 mg total) by mouth 2 (two) times daily. Patient taking differently: Take 40 mg by mouth daily.  12/13/17  Yes [name], MD  gabapentin (NEURONTIN) 300 MG capsule  Take 2 capsules (600 mg total) by mouth 2 (two) times daily. Patient taking differently: Take 300 mg by mouth 2 (two) times daily.  11/14/17  Yes Newlin, Enobong, MD  glucose blood (ONE TOUCH ULTRA TEST) test strip UAD UP TO THREE TIMES DAILY DX E11.4 11/20/17  Yes [name], MD  insulin aspart (NOVOLOG) 100 UNIT/ML injection 0 to 12 units subcutaneously 3 times daily before meals as per sliding scale 02/28/18  Yes Newlin, Enobong, MD  Insulin Glargine (BASAGLAR KWIKPEN) 100 UNIT/ML SOPN Inject 35 units into the skin daily. Patient taking differently: Inject 35 Units into the skin at bedtime.  02/28/18  Yes Newlin, Charlane Ferretti, MD  Insulin Pen Needle 31G X 5 MM MISC 1 each by Does not apply route at bedtime. 12/13/17  Yes [name], MD  Lancet Device MISC Use as instructed 3 times daily 09/19/17  Yes Fawze, Mina A, PA-C  lansoprazole (PREVACID) 15 MG capsule Take 1 capsule (15 mg total) by mouth daily. 12/13/17  Yes [name], MD  meclizine (ANTIVERT) 25 MG tablet Take 1 tablet (25 mg total) by mouth 2 (two) times daily as needed. Patient taking differently: Take 25 mg by mouth 2 (two) times daily as needed (vertigo).  05/06/16  Yes [name], MD  ONETOUCH DELICA LANCETS 79G MISC UAD UP TO THREE TIMES DAILY DX E11.4 11/20/17  Yes [name], MD  SUPER B COMPLEX/C CAPS Take 1 capsule by mouth daily.   Yes [provider]  tiZANidine (ZANAFLEX) 4 MG tablet Take 1 tablet (4 mg total) by mouth every 8 (eight) hours as needed for muscle spasms. 11/14/17  Yes [name], MD  atorvastatin (LIPITOR) 20 MG tablet Take 1 tablet (20 mg total) by mouth daily. Patient not taking: Reported on 01/02/2018 11/14/17   [name], MD  dicyclomine (BENTYL) 20 MG tablet Take 1 tablet (20 mg total) by mouth 2 (two) times daily. Patient not taking: Reported on 03/20/2018 01/02/18   Maudie Flakes, MD  lisinopril (PRINIVIL,ZESTRIL) 10 MG tablet Take 1 tablet (10 mg total) by mouth  daily. Patient not taking: Reported on 01/02/2018 12/13/17   [name], MD  metoCLOPramide (REGLAN) 5 MG tablet Take 1 tablet (5 mg total) by mouth 3 (three) times daily before meals. Patient not taking: Reported on 03/20/2018 11/14/17   [name], MD  sucralfate (CARAFATE) 1 g tablet Take 1 tablet (1 g total) by mouth 4 (four) times daily as needed. Patient not taking: Reported on 03/20/2018 01/02/18   Maudie Flakes, MD  traMADol (ULTRAM) 50 MG tablet Take 1 tablet (50 mg total) by mouth every 12 (twelve) hours as needed. Patient not taking: Reported on 03/20/2018 12/13/17   [name], MD    Inpatient Medications: Scheduled Meds: . allopurinol  300 mg Oral Daily  . aspirin EC  81 mg Oral Daily  . carvedilol  12.5 mg Oral BID WC  . enoxaparin (LOVENOX) injection  40 mg Subcutaneous Q24H  . furosemide  40 mg Intravenous BID  . gabapentin  300 mg Oral BID  .  insulin aspart  0-9 Units Subcutaneous TID WC  . insulin glargine  35 Units Subcutaneous QHS  . pantoprazole  20 mg Oral Daily  . sodium chloride flush  3 mL Intravenous Q12H   Continuous Infusions: . sodium chloride     PRN Meds: sodium chloride, acetaminophen, ondansetron (ZOFRAN) IV, sodium chloride flush, tiZANidine  Allergies:    Allergies  Allergen Reactions  . Garlic Shortness Of Breath, Itching and Swelling    Hand itching and swelling  . Latex Itching  . Morphine And Related Itching and Other (See Comments)    Headache   . Other Itching    Reaction to newspaper ink -itching and headache    Social History:   Social History   Socioeconomic History  . Marital status: Widowed    Spouse name: Not on file  . Number of children: Not on file  . Years of education: Not on file  . Highest education level: Not on file  Occupational History  . Not on file  Social Needs  . Financial resource strain: Not on file  . Food insecurity:    Worry: Not on file    Inability: Not on file  . Transportation  needs:    Medical: Not on file    Non-medical: Not on file  Tobacco Use  . Smoking status: Never Smoker  . Smokeless tobacco: Never Used  Substance and Sexual Activity  . Alcohol use: No  . Drug use: No  . Sexual activity: Not Currently    Birth control/protection: None  Lifestyle  . Physical activity:    Days per week: Not on file    Minutes per session: Not on file  . Stress: Not on file  Relationships  . Social connections:    Talks on phone: Not on file    Gets together: Not on file    Attends religious service: Not on file    Active member of club or organization: Not on file    Attends meetings of clubs or organizations: Not on file    Relationship status: Not on file  . Intimate partner violence:    Fear of current or ex partner: Not on file    Emotionally abused: Not on file    Physically abused: Not on file    Forced sexual activity: Not on file  Other Topics Concern  . Not on file  Social History Narrative  . Not on file    Family History:    Family History  Problem Relation Age of Onset  . Diabetes Mellitus II Father   . Stroke Father      ROS:  Please see the history of present illness.   All other ROS reviewed and negative.     Physical Exam/Data:   Vitals:   03/20/18 0500 03/20/18 0530 03/20/18 0600 03/20/18 0630  BP: (!) 157/74 (!) 149/69 136/63 136/64  Pulse: 77 74 74 80  Resp: 16 20 (!) 21 12  Temp:      TempSrc:      SpO2: 95% 98% 95% 95%  Weight:      Height:        Intake/Output Summary (Last 24 hours) at 03/20/2018 0747 Last data filed at 03/20/2018 0431 Gross per 24 hour  Intake -  Output 900 ml  Net -900 ml   Filed Weights   03/20/18 0033  Weight: 72.6 kg   Body mass index is 32.32 kg/m.  General:  Well nourished, well developed, obese female laying in  bed in no acute distress HEENT: sclera anicteric  Neck: no JVD Vascular: No carotid bruits; distal pulses 2+ bilaterally Cardiac:  normal S1, S2; RRR; soft murmur, no  rubs or gallops Lungs:  clear to auscultation bilaterally with fine crackles at lung bases Abd: NABS, soft, obese, nontender, no hepatomegaly Ext: mild non-pitting edema Musculoskeletal:  No deformities, BUE and BLE strength normal and equal Skin: warm and dry  Neuro:  CNs 2-12 intact, no focal abnormalities noted Psych:  Normal affect   EKG:  The EKG was personally reviewed and demonstrates:  sinus rhythm with lateral biphasic T-waves (seen on prior EKG), no STE/D. Telemetry:  Telemetry was personally reviewed and demonstrates:  NSR; apneic episodes noted.  Relevant CV Studies: Echocardiogram 11/2017: Study Conclusions  - Left ventricle: The cavity size was normal. Wall thickness was   increased in a pattern of mild LVH. Systolic function was normal.   The estimated ejection fraction was in the range of 55% to 60%.   Wall motion was normal; there were no regional wall motion   abnormalities. Doppler parameters are consistent with abnormal   left ventricular relaxation (grade 1 diastolic dysfunction). The   E/e&' ratio is >15, suggesting elevated LV filling pressure. - Aortic valve: Poorly visualized. Mildly calcified leaflets. Mild   stenosis. Mean gradient (S): 10 mm Hg. Peak gradient (S): 16 mm   Hg. Valve area (VTI): 1.46 cm^2. Valve area (Vmax): 1.34 cm^2.   Valve area (Vmean): 1.4 cm^2. - Mitral valve: Mildly thickened leaflets . There was trivial   regurgitation. - Left atrium: The atrium was normal in size. - Inferior vena cava: The vessel was normal in size. The   respirophasic diameter changes were in the normal range (>= 50%),   consistent with normal central venous pressure.  Impressions:  - Compared to a prior study in 06/2016, the LVEF is unchanged. There   is diastolic dysfunction with elevated LV filling pressure. There   is likely mild aortic stenosis with mean gradient of 10 mmHg.  Laboratory Data:  Chemistry Recent Labs  Lab 03/20/18 0054  NA 134*  K  3.6  CL 104  CO2 22  GLUCOSE 313*  BUN 27*  CREATININE 1.17*  CALCIUM 8.6*  GFRNONAA 50*  GFRAA 57*  ANIONGAP 8    No results for input(s): PROT, ALBUMIN, AST, ALT, ALKPHOS, BILITOT in the last 168 hours. Hematology Recent Labs  Lab 03/20/18 0054 03/20/18 0447  WBC 7.2 6.3  RBC 4.02 3.92  HGB 11.0* 10.8*  HCT 34.2* 33.5*  MCV 85.1 85.5  MCH 27.4 27.6  MCHC 32.2 32.2  RDW 14.0 14.1  PLT 238 223   Cardiac Enzymes Recent Labs  Lab 03/20/18 0447  TROPONINI <0.03    Recent Labs  Lab 03/20/18 0058  TROPIPOC 0.01    BNP Recent Labs  Lab 03/20/18 0054  BNP 218.7*    DDimer No results for input(s): DDIMER in the last 168 hours.  Radiology/Studies:  Dg Chest 2 View  Result Date: 03/20/2018 CLINICAL DATA:  Shortness of breath. EXAM: CHEST - 2 VIEW COMPARISON:  Radiograph 01/02/2018 FINDINGS: Mild cardiomegaly is similar. Peribronchial thickening is suspicious for pulmonary edema. Small pleural effusions. No focal airspace disease. No pneumothorax. Degenerative change in the spine. IMPRESSION: Findings suggesting CHF. Mild cardiomegaly and small pleural effusions. Peribronchial thickening is suspicious for pulmonary edema. Electronically Signed   By: Keith Rake M.D.   On: 03/20/2018 01:59    Assessment and Plan:   1. Acute  on chronic diastolic CHF: patient presented with SOB, LE edema, and weight gain.  She was hypoxic on arrival with O2 sats 86%, improved on O2 via [place]. CXR with cardiomegaly with small pleural effusions and pulmonary edema. BNP 218 (elevated above prior levels on admission for CHF exacerbation). Trops negative x2. EKG without ischemic changes. She was started on IV lasix - no I&Os or weights documented at this time. She has a history of stress-induced cardiomyopathy with EF 30-35% on echo 04/2016, who underwent cardiac cath at that time which showed normal coronaries and normal EF of 50%. Last echo 11/2017 with EF 55-60%, G1DD, and mild AS. No recent  increased stressors to suggest recurrent takotsubo.  - Continue carvedilol - Continue IV diuresis with lasix 22m BID - Continue to monitor strict I&Os and daily weights. - Monitor electrolytes closely to maintain K >4, Mg >2  - Could consider repeat limited echo to assess LV function and wall motion.   2. HTN: BP persistently elevated. Per chart review, she was transitioned from losartan to lisinopril by PCP 12/2017 but patient reports never taking this medication. Hesitant to restart ACEi/ARB given need for diuresis and labile Cr over the past several months. - Continue carvedilol and lasix - Will cautiously start amlodipine 564mdaily though this may worsening LE edema  3. HLD: no lipids on file - Continue atorvastatin  4. DM type 2: A1C 9.6 11/2017 - Continue management per primary team  5. History of CVA:  - Continue aspirin and atorvastatin  6. Chronic renal insufficiency stage 3: Cr 1.15 today - appears to be baseline.  - Continue to monitor with diuresis.   7. Suspected OSA: reports chronic orthopnea and PND - Would benefit from a sleep study at discharge    For questions or updates, please contact [facility] consult [email or web address] for contact info under Cardiology/STEMI.   Signed, KrAbigail ButtsPA-C  03/20/2018 7:47 AM [id number] have seen and examined the patient along with KrAbigail ButtsPA-C .  I have reviewed the chart, notes and new data.  I agree with PA/NP's note.  Key new complaints: improved after first dose of lasix, but UO has decreased already and she is quite dyspneic and orthopneic again Key examination changes: bibasilar crackles; 157/71 Key new findings / data: creat upper normal, K OK, BNP elevated from baseline.  PLAN: Will give another dose of diuretic right now. Cause of decompensation unclear: denies noncompliance with sodium restriction or meds. I am not sure why ARB was stopped. Creatinine was higher in August following  diuresis. Change in creatinine in October was after an episode of abdominal distress and severe hyperglycemia. Each time creatinine returned to baseline. OK to hold ARB during diuresis, but I believe that medication is useful for her in the long run.  MiSanda KleinMD, FAGreenwood [phone]/17/2019, 2:18 PM

## 2018-03-20 NOTE — ED Triage Notes (Signed)
Pt presents by [facility] for shortness of breath that has been ongoing for the last several days. EMS advised giving one albuterol treatment during transport with minimal improvement by pt.

## 2018-03-20 NOTE — Progress Notes (Signed)
Patient is a 62 year old female with known history of CVA as well as congestive heart failure who is here with acute exacerbation of CHF.  She has no recent echocardiogram.  Patient admitted and started on IV Lasix.  Cardiology has been consulted.  Patient has echocardiogram also pending.  We will continue diuresis and according to cardiology.

## 2018-03-20 NOTE — ED Notes (Signed)
Attempted report. RN to call right back

## 2018-03-21 ENCOUNTER — Inpatient Hospital Stay (HOSPITAL_COMMUNITY): Payer: Medicare HMO

## 2018-03-21 DIAGNOSIS — R0602 Shortness of breath: Secondary | ICD-10-CM

## 2018-03-21 LAB — BASIC METABOLIC PANEL
Anion gap: 9 (ref 5–15)
BUN: 37 mg/dL — ABNORMAL HIGH (ref 8–23)
CALCIUM: 8.7 mg/dL — AB (ref 8.9–10.3)
CO2: 26 mmol/L (ref 22–32)
CREATININE: 1.36 mg/dL — AB (ref 0.44–1.00)
Chloride: 99 mmol/L (ref 98–111)
GFR calc Af Amer: 48 mL/min — ABNORMAL LOW (ref >60)
GFR calc non Af Amer: 41 mL/min — ABNORMAL LOW (ref >60)
Glucose, Bld: 212 mg/dL — ABNORMAL HIGH (ref 70–99)
Potassium: 3.6 mmol/L (ref 3.5–5.1)
Sodium: 134 mmol/L — ABNORMAL LOW (ref 135–145)

## 2018-03-21 LAB — ECHOCARDIOGRAM LIMITED
Height: 59 in
Weight: 2777.6 oz

## 2018-03-21 LAB — GLUCOSE, CAPILLARY
GLUCOSE-CAPILLARY: 227 mg/dL — AB (ref 70–99)
Glucose-Capillary: 197 mg/dL — ABNORMAL HIGH (ref 70–99)
Glucose-Capillary: 301 mg/dL — ABNORMAL HIGH (ref 70–99)
Glucose-Capillary: 253 mg/dL — ABNORMAL HIGH (ref 70–99)

## 2018-03-21 MED ORDER — POTASSIUM CHLORIDE CRYS ER 20 MEQ PO TBCR
40.0000 meq | EXTENDED_RELEASE_TABLET | Freq: Once | ORAL | Status: AC
  Administered 2018-03-21: 40 meq via ORAL
  Filled 2018-03-21: qty 2

## 2018-03-21 NOTE — Care Management Note (Signed)
Case Management Note  Patient Details  Name: Zniyah Midkiff MRN: [id number] Date of Birth: 15-Jun-1955  Subjective/Objective: Per patient's insurance-[facility] is not in network-[facility] rep Malachy Mood can accept. HHRN. Await PT cons, & recc.Patient in agreement.                   Action/Plan:dc home w/HHC.   Expected Discharge Date:  (unknown)               Expected Discharge Plan:  [place]  In-House Referral:     Discharge planning Services  CM Consult  Post Acute Care Choice:    Choice offered to:  Patient  DME Arranged:    DME Agency:     HH Arranged:    [place] Agency:     Status of Service:  In process, will continue to follow  If discussed at Long Length of Stay Meetings, dates discussed:    Additional Comments:  Dessa Phi, RN 03/21/2018, 3:45 PM

## 2018-03-21 NOTE — Progress Notes (Signed)
Patient ID: Kara Hanson, female   DOB: May 28, 1955, 62 y.o.   MRN: [id number]  PROGRESS NOTE    Andie Mungin  XIP:[id number] DOB: 1955-05-08 DOA: 03/20/2018 PCP: [name], MD   Brief Narrative:  Patient is a 62 year old female with known history of CVA as well as congestive heart failure who is here with acute exacerbation of CHF.    Patient reported orthopnea PND and worsening lower extremity edema with atypical chest pain.  She was found at home by her daughter who transported her to the ER.  Her last echocardiogram was in August because of this year showing EF of 55 to 67% with diastolic dysfunction.    Patient admitted and started on IV Lasix.  Cardiology has been consulted.  Patient has echocardiogram also pending   Assessment & Plan:   Principal Problem:   Acute and chronic respiratory failure with hypoxia (HCC) Active Problems:   Hypertension   Acute on chronic diastolic CHF (congestive heart failure) ([place])   Diabetes mellitus type 2 with complications, uncontrolled (HCC)   CKD (chronic kidney disease), stage III (HCC)   #1 acute exacerbation of diastolic heart failure: Patient is responding to diuresis.  We appreciate cardiology consultation.  Limited echocardiogram ordered today.  We will continue to follow-up according to cardiology  #2 hypertension: Blood pressure appears controlled.  Continue monitoring.  #3 diabetes: Continue sliding scale insulin with home regimen.  #4 chronic kidney disease stage III: So far stable at this point.  Continue monitoring.   DVT prophylaxis: Lovenox Code Status: Full code Family Communication: Discussed with patient Disposition Plan: Home  Consultants:   Cardiology  Procedures:   Limited echocardiogram ordered  Antimicrobials:   None   Subjective: Patient is responding to diuresis and feels much better but still having significant shortness of breath.  She also has some orthopnea but no pedal edema  today  Objective: Vitals:   03/20/18 1630 03/20/18 1700 03/20/18 2054 03/21/18 0511  BP: (!) 142/69 136/70 133/68 (!) 158/67  Pulse: 74 73 75 77  Resp: (!) 21 (!) 21 16 16   Temp:   98.3 F (36.8 C) 98.1 F (36.7 C)  TempSrc:   Oral Oral  SpO2: 94% 95% 97% 94%  Weight:    78.7 kg  Height:        Intake/Output Summary (Last 24 hours) at 03/21/2018 0842 Last data filed at 03/20/2018 1754 Gross per 24 hour  Intake 240 ml  Output 700 ml  Net -460 ml   Filed Weights   03/20/18 0033 03/21/18 0511  Weight: 72.6 kg 78.7 kg    Examination:  General exam: Appears calm and comfortable  Respiratory system: Bilateral crackles with some mild expiratory wheezing. Respiratory effort normal. Cardiovascular system: S1 & S2 heard, RRR. No JVD, murmurs, rubs, gallops or clicks. No pedal edema. Gastrointestinal system: Abdomen is nondistended, soft and nontender. No organomegaly or masses felt. Normal bowel sounds heard. Central nervous system: Alert and oriented. No focal neurological deficits. Extremities: Symmetric 5 x 5 power. Skin: No rashes, lesions or ulcers Psychiatry: Judgement and insight appear normal. Mood & affect appropriate.     Data Reviewed: I have personally reviewed following labs and imaging studies  CBC: Recent Labs  Lab 03/20/18 0054 03/20/18 0447  WBC 7.2 6.3  NEUTROABS 5.7 4.6  HGB 11.0* 10.8*  HCT 34.2* 33.5*  MCV 85.1 85.5  PLT 238 341   Basic Metabolic Panel: Recent Labs  Lab 03/20/18 0054 03/21/18 0616  NA 134* 134*  K 3.6 3.6  CL 104 99  CO2 22 26  GLUCOSE 313* 212*  BUN 27* 37*  CREATININE 1.17* 1.36*  CALCIUM 8.6* 8.7*   GFR: Estimated Creatinine Clearance: 38.4 mL/min (A) (by C-G formula based on SCr of 1.36 mg/dL (H)). Liver Function Tests: No results for input(s): AST, ALT, ALKPHOS, BILITOT, PROT, ALBUMIN in the last 168 hours. No results for input(s): LIPASE, AMYLASE in the last 168 hours. No results for input(s): AMMONIA in the  last 168 hours. Coagulation Profile: No results for input(s): INR, PROTIME in the last 168 hours. Cardiac Enzymes: Recent Labs  Lab 03/20/18 0447 03/20/18 1035  TROPONINI <0.03 <0.03   BNP (last 3 results) No results for input(s): PROBNP in the last 8760 hours. HbA1C: No results for input(s): HGBA1C in the last 72 hours. CBG: Recent Labs  Lab 03/20/18 0755 03/20/18 1241 03/20/18 1803 03/20/18 2055 03/21/18 0734  GLUCAP 252* 289* 252* 238* 197*   Lipid Profile: No results for input(s): CHOL, HDL, LDLCALC, TRIG, CHOLHDL, LDLDIRECT in the last 72 hours. Thyroid Function Tests: No results for input(s): TSH, T4TOTAL, FREET4, T3FREE, THYROIDAB in the last 72 hours. Anemia Panel: No results for input(s): VITAMINB12, FOLATE, FERRITIN, TIBC, IRON, RETICCTPCT in the last 72 hours. Sepsis Labs: No results for input(s): PROCALCITON, LATICACIDVEN in the last 168 hours.  No results found for this or any previous visit (from the past 240 hour(s)).       Radiology Studies: Dg Chest 2 View  Result Date: 03/20/2018 CLINICAL DATA:  Shortness of breath. EXAM: CHEST - 2 VIEW COMPARISON:  Radiograph 01/02/2018 FINDINGS: Mild cardiomegaly is similar. Peribronchial thickening is suspicious for pulmonary edema. Small pleural effusions. No focal airspace disease. No pneumothorax. Degenerative change in the spine. IMPRESSION: Findings suggesting CHF. Mild cardiomegaly and small pleural effusions. Peribronchial thickening is suspicious for pulmonary edema. Electronically Signed   By: Keith Rake M.D.   On: 03/20/2018 01:59        Scheduled Meds: . allopurinol  300 mg Oral Daily  . amLODipine  5 mg Oral Daily  . aspirin EC  81 mg Oral Daily  . carvedilol  12.5 mg Oral BID WC  . enoxaparin (LOVENOX) injection  40 mg Subcutaneous Q24H  . furosemide  40 mg Intravenous BID  . gabapentin  300 mg Oral BID  . insulin aspart  0-9 Units Subcutaneous TID WC  . insulin glargine  35 Units  Subcutaneous QHS  . pantoprazole  20 mg Oral Daily  . sodium chloride flush  3 mL Intravenous Q12H   Continuous Infusions: . sodium chloride       LOS: 1 day    Time spent: 36 minutes    Britiany Silbernagel,LAWAL, MD [facility] Pager [phone]  If 7PM-7AM, please contact night-coverage [email or web address] Password [facility] 03/21/2018, 8:42 AM

## 2018-03-21 NOTE — Care Management Note (Signed)
Case Management Note  Patient Details  Name: Bret Stamour MRN: [id number] Date of Birth: December 12, 1955  Subjective/Objective: Acute on chronic respiratory failure.CHF. From home. On 02-will monitor. [facility] rep Ronalee Belts for CHF protocal-await HHRN order/REDS Vest/Dr. Tempie Hoist protocal. PT cons-await recc.                    Action/Plan:dJc home w/HH/CHF protocal.   Expected Discharge Date:  (unknown)               Expected Discharge Plan:  [place]  In-House Referral:     Discharge planning Services  CM Consult  Post Acute Care Choice:    Choice offered to:  Patient  DME Arranged:    DME Agency:     HH Arranged:    [place] Agency:     Status of Service:  In process, will continue to follow  If discussed at Long Length of Stay Meetings, dates discussed:    Additional Comments:  Dessa Phi, RN 03/21/2018, 12:00 PM

## 2018-03-21 NOTE — Progress Notes (Addendum)
Progress Note  Patient Name: Kara Hanson Date of Encounter: 03/21/2018  Primary Cardiologist: Candee Furbish, MD   Subjective   Breathing and swelling improved but not back to baseline. Denies chest pain or palpitations.   Inpatient Medications    Scheduled Meds: . allopurinol  300 mg Oral Daily  . amLODipine  5 mg Oral Daily  . aspirin EC  81 mg Oral Daily  . carvedilol  12.5 mg Oral BID WC  . enoxaparin (LOVENOX) injection  40 mg Subcutaneous Q24H  . furosemide  40 mg Intravenous BID  . gabapentin  300 mg Oral BID  . insulin aspart  0-9 Units Subcutaneous TID WC  . insulin glargine  35 Units Subcutaneous QHS  . pantoprazole  20 mg Oral Daily  . sodium chloride flush  3 mL Intravenous Q12H   Continuous Infusions: . sodium chloride     PRN Meds: sodium chloride, acetaminophen, ondansetron (ZOFRAN) IV, sodium chloride flush, tiZANidine   Vital Signs    Vitals:   03/20/18 1630 03/20/18 1700 03/20/18 2054 03/21/18 0511  BP: (!) 142/69 136/70 133/68 (!) 158/67  Pulse: 74 73 75 77  Resp: (!) 21 (!) 21 16 16   Temp:   98.3 F (36.8 C) 98.1 F (36.7 C)  TempSrc:   Oral Oral  SpO2: 94% 95% 97% 94%  Weight:    78.7 kg  Height:        Intake/Output Summary (Last 24 hours) at 03/21/2018 0848 Last data filed at 03/20/2018 1754 Gross per 24 hour  Intake 240 ml  Output 700 ml  Net -460 ml   Filed Weights   03/20/18 0033 03/21/18 0511  Weight: 72.6 kg 78.7 kg    Telemetry    NSR - Personally Reviewed  Physical Exam   GEN: Sitting upright in bedside chair in no acute distress.   Neck: No JVD, no carotid bruits Cardiac:  RRR, no murmurs, rubs, or gallops.  Respiratory: crackles at lung bases bilaterally GI: NABS, Soft, nontender, non-distended  MS: No edema; No deformity. Neuro:  Nonfocal, moving all extremities spontaneously Psych: Normal affect   Labs    Chemistry Recent Labs  Lab 03/20/18 0054 03/21/18 0616  NA 134* 134*  K 3.6 3.6  CL 104 99    CO2 22 26  GLUCOSE 313* 212*  BUN 27* 37*  CREATININE 1.17* 1.36*  CALCIUM 8.6* 8.7*  GFRNONAA 50* 41*  GFRAA 57* 48*  ANIONGAP 8 9     Hematology Recent Labs  Lab 03/20/18 0054 03/20/18 0447  WBC 7.2 6.3  RBC 4.02 3.92  HGB 11.0* 10.8*  HCT 34.2* 33.5*  MCV 85.1 85.5  MCH 27.4 27.6  MCHC 32.2 32.2  RDW 14.0 14.1  PLT 238 223    Cardiac Enzymes Recent Labs  Lab 03/20/18 0447 03/20/18 1035  TROPONINI <0.03 <0.03    Recent Labs  Lab 03/20/18 0058  TROPIPOC 0.01     BNP Recent Labs  Lab 03/20/18 0054  BNP 218.7*     DDimer No results for input(s): DDIMER in the last 168 hours.   Radiology    Dg Chest 2 View  Result Date: 03/20/2018 CLINICAL DATA:  Shortness of breath. EXAM: CHEST - 2 VIEW COMPARISON:  Radiograph 01/02/2018 FINDINGS: Mild cardiomegaly is similar. Peribronchial thickening is suspicious for pulmonary edema. Small pleural effusions. No focal airspace disease. No pneumothorax. Degenerative change in the spine. IMPRESSION: Findings suggesting CHF. Mild cardiomegaly and small pleural effusions. Peribronchial thickening is suspicious for pulmonary  edema. Electronically Signed   By: Keith Rake M.D.   On: 03/20/2018 01:59    Cardiac Studies   Echocardiogram 11/2017: Study Conclusions  - Left ventricle: The cavity size was normal. Wall thickness was increased in a pattern of mild LVH. Systolic function was normal. The estimated ejection fraction was in the range of 55% to 60%. Wall motion was normal; there were no regional wall motion abnormalities. Doppler parameters are consistent with abnormal left ventricular relaxation (grade 1 diastolic dysfunction). The E/e&' ratio is >15, suggesting elevated LV filling pressure. - Aortic valve: Poorly visualized. Mildly calcified leaflets. Mild stenosis. Mean gradient (S): 10 mm Hg. Peak gradient (S): 16 mm Hg. Valve area (VTI): 1.46 cm^2. Valve area (Vmax): 1.34 cm^2. Valve  area (Vmean): 1.4 cm^2. - Mitral valve: Mildly thickened leaflets . There was trivial regurgitation. - Left atrium: The atrium was normal in size. - Inferior vena cava: The vessel was normal in size. The respirophasic diameter changes were in the normal range (>= 50%), consistent with normal central venous pressure.  Impressions:  - Compared to a prior study in 06/2016, the LVEF is unchanged. There is diastolic dysfunction with elevated LV filling pressure. There is likely mild aortic stenosis with mean gradient of 10 mmHg.  Patient Profile     62 y.o. female with a PMH of chronic diastolic CHF, stress induced cardiomyopathy, HTN, HLD, DM type 2, and history of CVA who is being followed by cardiology for the evaluation of acute on chronic diastolic CHF.  Assessment & Plan    1. Acute on chronic diastolic CHF: p/w SOB, LE edema, weight gain, and orthopnea. Prior history of takotsubo cardiomyopathy. Last echo 11/2017 with EF 55-60% with G1DD and mild AS. CXR this admission with pulmonary edema. She was started on IV lasix 40mg  BID with UOP net -427mL in the last 24 hours and -1.3L this admission. Cr 1.17>1.36 today. Wt 160lb on admission to 173lbs today - likely inaccurate. Still with some crackles at lung bases - Continue IV lasix 40mg  BID - Limited echo pending to assess LV function and wall motion - Continue to monitor strict I&Os and daily weights  2. HTN: BP still elevated although improved from admission. Started on amlodipine 03/20/18. Hopeful to restart ARB after diuresis.  - Continue amlodipine, carvedilol, and lasix  3. Acute on chronic renal insufficiency: Cr 1.15 on admission, up to 1.36 today with IV diuresis.  - Continue to monitor closely with diuresis  4. HLD: no lipids on file - Continue atorvastatin  5. DM type 2: A1C 9.6 11/2017 - Continue management per primary team  6. History of CVA:  - Continue aspirin and atorvastatin  7. Suspected OSA: reports  chronic orthopnea and PND - Would benefit from a sleep study at discharge   For questions or updates, please contact [place] Please consult [email or web address] for contact info under Cardiology/STEMI.      Signed, Abigail Butts, PA-C  03/21/2018, 8:48 AM   [phone]  I have seen and examined the patient along with Abigail Butts, PA-C .  I have reviewed the chart, notes and new data.  I agree with PA/NP's note.  Key new complaints: improved but not back to baseline Key examination changes: Persistent bibasilar crackles Key new findings / data: Slight increase in creatinine  PLAN: Agree with continued diuretics.  Echo pending.  Sanda Klein, MD, [place] [phone] 03/21/2018, 1:38 PM    Echo confirms that she is still hypervolemic, preserved LVEF.  Sanda Klein, MD, [facility] [phone] office [phone] pager  4:16 PM

## 2018-03-21 NOTE — Progress Notes (Signed)
  Echocardiogram 2D Echocardiogram limited has been performed.  Kara Hanson 03/21/2018, 2:53 PM

## 2018-03-21 NOTE — Evaluation (Signed)
Physical Therapy Evaluation Patient Details Name: Kara Hanson MRN: [id number] DOB: 1956/03/23 Today's Date: 03/21/2018   History of Present Illness  62 yo female admitted with respiratory failure, pulmonary edema, CHF, LE swelling. Hx of CHF, CVA, DM, MI, Takotsubo cardiomyopathy, spinal stenosis  Clinical Impression  On eval, pt was Min guard assist for mobility.She walked ~50 feet in the hallway with a walker. O2 sat level: at rest 94% on RA; 88-89% on RA during ambulation. Will continue to follow. Pt c/o LE weakness affecting her ability to negotiate stairs. Will recommend HHPT f/u if pt is agreeable.     Follow Up Recommendations Supervision - Intermittent;Home health PT(if pt is agreeable. )    Equipment Recommendations  (continuing to assess)    Recommendations for Other Services       Precautions / Restrictions Precautions Precautions: Fall Precaution Comments: monitor O2 Restrictions Weight Bearing Restrictions: No      Mobility  Bed Mobility               General bed mobility comments: oob in recliner  Transfers Overall transfer level: Needs assistance Equipment used: None;4-wheeled walker Transfers: Sit to/from Stand Sit to Stand: Supervision         General transfer comment: for safety  Ambulation/Gait Ambulation/Gait assistance: Min guard Gait Distance (Feet): 50 Feet Assistive device: 4-wheeled walker Gait Pattern/deviations: Step-through pattern;Decreased stride length     General Gait Details: slow gait speed. used rollator for safety, energy conservation. O2 sat 88-89% on RA, dyspnea 2/4.   Stairs            Wheelchair Mobility    Modified Rankin (Stroke Patients Only)       Balance Overall balance assessment: History of Falls;Needs assistance           Standing balance-Leahy Scale: Fair                               Pertinent Vitals/Pain Pain Assessment: Faces Faces Pain Scale: Hurts little  more Pain Location: chest Pain Descriptors / Indicators: Discomfort;Sore;Heaviness Pain Intervention(s): Limited activity within patient's tolerance    Home Living Family/patient expects to be discharged to:: Private residence Living Arrangements: Children Available Help at Discharge: Family;Available PRN/intermittently Type of Home: Apartment Home Access: Stairs to enter Entrance Stairs-Rails: Right Entrance Stairs-Number of Steps: 1 flight Home Layout: One level Home Equipment: None      Prior Function Level of Independence: Independent               Hand Dominance        Extremity/Trunk Assessment   Upper Extremity Assessment Upper Extremity Assessment: Overall WFL for tasks assessed    Lower Extremity Assessment Lower Extremity Assessment: Generalized weakness    Cervical / Trunk Assessment Cervical / Trunk Assessment: Normal  Communication   Communication: No difficulties  Cognition Arousal/Alertness: Awake/alert Behavior During Therapy: WFL for tasks assessed/performed Overall Cognitive Status: Within Functional Limits for tasks assessed                                        General Comments      Exercises     Assessment/Plan    PT Assessment Patient needs continued PT services  PT Problem List Decreased balance;Decreased activity tolerance;Decreased mobility;Pain;Decreased strength       PT Treatment Interventions Gait training;DME instruction;Functional mobility  training;Therapeutic activities;Balance training;Patient/family education;Therapeutic exercise    PT Goals (Current goals can be found in the Care Plan section)  Acute Rehab PT Goals Patient Stated Goal: to get better PT Goal Formulation: With patient Time For Goal Achievement: 04/04/18 Potential to Achieve Goals: Good    Frequency Min 3X/week   Barriers to discharge        Co-evaluation               AM-PAC PT "6 Clicks" Mobility  Outcome Measure  Help needed turning from your back to your side while in a flat bed without using bedrails?: None Help needed moving from lying on your back to sitting on the side of a flat bed without using bedrails?: None Help needed moving to and from a bed to a chair (including a wheelchair)?: A Little Help needed standing up from a chair using your arms (e.g., wheelchair or bedside chair)?: A Little Help needed to walk in hospital room?: A Little Help needed climbing 3-5 steps with a railing? : A Little 6 Click Score: 20    End of Session   Activity Tolerance: Patient limited by fatigue Patient left: in chair;with call bell/phone within reach   PT Visit Diagnosis: Difficulty in walking, not elsewhere classified (R26.2);Unsteadiness on feet (R26.81);Muscle weakness (generalized) (M62.81)    Time: 5465-0354 PT Time Calculation (min) (ACUTE ONLY): 13 min   Charges:   PT Evaluation $PT Eval Moderate Complexity: [place], PT Acute Rehabilitation Services Pager: [phone] Office: [phone]

## 2018-03-21 NOTE — Progress Notes (Signed)
Inpatient Diabetes Program Recommendations  AACE/ADA: New Consensus Statement on Inpatient Glycemic Control (2015)  Target Ranges:  Prepandial:   less than 140 mg/dL      Peak postprandial:   less than 180 mg/dL (1-2 hours)      Critically ill patients:  140 - 180 mg/dL   Lab Results  Component Value Date   GLUCAP 301 (H) 03/21/2018   HGBA1C 9.6 (H) 11/27/2017    Review of Glycemic Control  Post-prandial blood sugars elevated.  Inpatient Diabetes Program Recommendations:     Add Novolog 4 units tidwc for meal coverage insulin if pt eats > 50% meal.  Will continue to follow.  Thank you. Lorenda Peck, RD, LDN, CDE Inpatient Diabetes Coordinator [phone]

## 2018-03-22 LAB — GLUCOSE, CAPILLARY
GLUCOSE-CAPILLARY: 228 mg/dL — AB (ref 70–99)
GLUCOSE-CAPILLARY: 223 mg/dL — AB (ref 70–99)
Glucose-Capillary: 159 mg/dL — ABNORMAL HIGH (ref 70–99)
Glucose-Capillary: 211 mg/dL — ABNORMAL HIGH (ref 70–99)

## 2018-03-22 LAB — BASIC METABOLIC PANEL
Anion gap: 8 (ref 5–15)
BUN: 47 mg/dL — ABNORMAL HIGH (ref 8–23)
CO2: 27 mmol/L (ref 22–32)
Calcium: 8.9 mg/dL (ref 8.9–10.3)
Chloride: 100 mmol/L (ref 98–111)
Creatinine, Ser: 1.44 mg/dL — ABNORMAL HIGH (ref 0.44–1.00)
GFR calc Af Amer: 45 mL/min — ABNORMAL LOW (ref >60)
GFR calc non Af Amer: 39 mL/min — ABNORMAL LOW (ref >60)
Glucose, Bld: 147 mg/dL — ABNORMAL HIGH (ref 70–99)
Potassium: 4 mmol/L (ref 3.5–5.1)
Sodium: 135 mmol/L (ref 135–145)

## 2018-03-22 MED ORDER — MENTHOL 3 MG MT LOZG
1.0000 | LOZENGE | OROMUCOSAL | Status: DC | PRN
  Filled 2018-03-22 (×2): qty 9

## 2018-03-22 NOTE — Progress Notes (Signed)
PROGRESS NOTE    Kara Hanson  CZY:[id number] DOB: 01-Jan-1956 DOA: 03/20/2018 PCP: [name], MD   Brief Narrative:  Patient is Kara Hanson 62 year old female with known history of CVA as well as congestive heart failure who is here with acute exacerbation of CHF.   Patient reported orthopnea PND and worsening lower extremity edema with atypical chest pain.  She was found at home by her daughter who transported her to the ER.  Her last echocardiogram was in August because of this year showing EF of 55 to 09% with diastolic dysfunction.   Patient admitted and started on IV Lasix. Cardiology has been consulted. Patient has echocardiogram also pending  Assessment & Plan:   Principal Problem:   Acute and chronic respiratory failure with hypoxia (HCC) Active Problems:   Hypertension   Acute on chronic diastolic CHF (congestive heart failure) ([place])   Diabetes mellitus type 2 with complications, uncontrolled (HCC)   CKD (chronic kidney disease), stage III ([place])   #1 acute exacerbation of diastolic heart failure:   Echo with grade 2 diastolic dysfunction 32/35 Continue lasix 40 IV BID I/O, daily weights (listed weight up since admission, likely inaccurate, continue to follow) Cardiology consulted, appreciate recs  Wt Readings from Last 3 Encounters:  03/22/18 77.7 kg  01/02/18 80.5 kg  12/13/17 80.5 kg    #2 hypertension:  Amlodipine, coreg, lasix.  Holding lisinopril at this time.   #3 diabetes: Continue sliding scale insulin with home regimen.  #4 chronic kidney disease stage III: creatinine rising with diuresis.  Follow closely.   DVT prophylaxis: lovenox Code Status: full  Family Communication: none at bedside Disposition Plan: pending improvement in SOB   Consultants:   cardiology  Procedures: Echo Study Conclusions  - Left ventricle: The cavity size was normal. Systolic function was   at the lower limits of normal. The estimated ejection fraction   was in  the range of 50% to 55%. Wall motion was normal; there   were no regional wall motion abnormalities. Features are   consistent with Kara Hanson pseudonormal left ventricular filling pattern,   with concomitant abnormal relaxation and increased filling   pressure (grade 2 diastolic dysfunction).  Antimicrobials:  Anti-infectives (From admission, onward)   None      Subjective: Feels persistently SOB.    Objective: Vitals:   03/22/18 0543 03/22/18 1158 03/22/18 1519 03/22/18 1532  BP: (!) 167/80  (!) 159/71   Pulse: 79  88   Resp: 18     Temp: 98.1 F (36.7 C)  98.5 F (36.9 C)   TempSrc: Oral  Oral   SpO2: 95% 93% 90% 90%  Weight: 77.7 kg     Height:        Intake/Output Summary (Last 24 hours) at 03/22/2018 1843 Last data filed at 03/22/2018 1746 Gross per 24 hour  Intake 360 ml  Output 1501 ml  Net -1141 ml   Filed Weights   03/20/18 0033 03/21/18 0511 03/22/18 0543  Weight: 72.6 kg 78.7 kg 77.7 kg    Examination:  General exam: Appears calm and comfortable  Respiratory system: crackles at bases Cardiovascular system: S1 & S2 heard, RRR.  Gastrointestinal system: Abdomen is nondistended, soft and nontender. Central nervous system: Alert and oriented. No focal neurological deficits. Extremities: trace LE edema Skin: No rashes, lesions or ulcers Psychiatry: Judgement and insight appear normal. Mood & affect appropriate.     Data Reviewed: I have personally reviewed following labs and imaging studies  CBC: Recent Labs  Lab 03/20/18 0054 03/20/18 0447  WBC 7.2 6.3  NEUTROABS 5.7 4.6  HGB 11.0* 10.8*  HCT 34.2* 33.5*  MCV 85.1 85.5  PLT 238 885   Basic Metabolic Panel: Recent Labs  Lab 03/20/18 0054 03/21/18 0616 03/22/18 0548  NA 134* 134* 135  K 3.6 3.6 4.0  CL 104 99 100  CO2 22 26 27   GLUCOSE 313* 212* 147*  BUN 27* 37* 47*  CREATININE 1.17* 1.36* 1.44*  CALCIUM 8.6* 8.7* 8.9   GFR: Estimated Creatinine Clearance: 36 mL/min (Kara Hanson) (by C-G  formula based on SCr of 1.44 mg/dL (H)). Liver Function Tests: No results for input(s): AST, ALT, ALKPHOS, BILITOT, PROT, ALBUMIN in the last 168 hours. No results for input(s): LIPASE, AMYLASE in the last 168 hours. No results for input(s): AMMONIA in the last 168 hours. Coagulation Profile: No results for input(s): INR, PROTIME in the last 168 hours. Cardiac Enzymes: Recent Labs  Lab 03/20/18 0447 03/20/18 1035  TROPONINI <0.03 <0.03   BNP (last 3 results) No results for input(s): PROBNP in the last 8760 hours. HbA1C: No results for input(s): HGBA1C in the last 72 hours. CBG: Recent Labs  Lab 03/21/18 1713 03/21/18 2057 03/22/18 0802 03/22/18 1147 03/22/18 1623  GLUCAP 253* 227* 159* 211* 228*   Lipid Profile: No results for input(s): CHOL, HDL, LDLCALC, TRIG, CHOLHDL, LDLDIRECT in the last 72 hours. Thyroid Function Tests: No results for input(s): TSH, T4TOTAL, FREET4, T3FREE, THYROIDAB in the last 72 hours. Anemia Panel: No results for input(s): VITAMINB12, FOLATE, FERRITIN, TIBC, IRON, RETICCTPCT in the last 72 hours. Sepsis Labs: No results for input(s): PROCALCITON, LATICACIDVEN in the last 168 hours.  No results found for this or any previous visit (from the past 240 hour(s)).       Radiology Studies: No results found.      Scheduled Meds: . allopurinol  300 mg Oral Daily  . amLODipine  5 mg Oral Daily  . aspirin EC  81 mg Oral Daily  . carvedilol  12.5 mg Oral BID WC  . enoxaparin (LOVENOX) injection  40 mg Subcutaneous Q24H  . furosemide  40 mg Intravenous BID  . gabapentin  300 mg Oral BID  . insulin aspart  0-9 Units Subcutaneous TID WC  . insulin glargine  35 Units Subcutaneous QHS  . pantoprazole  20 mg Oral Daily  . sodium chloride flush  3 mL Intravenous Q12H   Continuous Infusions: . sodium chloride       LOS: 2 days    Time spent: over 30 min    Fayrene Helper, MD [facility] Pager [phone]  If 7PM-7AM, please  contact night-coverage [email or web address] Password [facility] 03/22/2018, 6:43 PM

## 2018-03-22 NOTE — Progress Notes (Signed)
Pt uniated 464ml of urine. Post-void bladder scanner reads 31ml. MD Florene Glen notified. This RN was instructed not to I&O cath at this time. Will continue to monitor.

## 2018-03-22 NOTE — Care Management Note (Signed)
Case Management Note  Patient Details  Name: Kara Hanson MRN: [id number] Date of Birth: June 04, 1955  Subjective/Objective: [facility] able to accept patient for [facility] following-patient in agreement-HHRN/HHPT.                   Action/Plan:dc home w/HHC.   Expected Discharge Date:  (unknown)               Expected Discharge Plan:  [place]  In-House Referral:     Discharge planning Services  CM Consult  Post Acute Care Choice:    Choice offered to:  Patient  DME Arranged:    DME Agency:     HH Arranged:  RN, PT [place] Agency:  [place]  Status of Service:  In process, will continue to follow  If discussed at Long Length of Stay Meetings, dates discussed:    Additional Comments:  Dessa Phi, RN 03/22/2018, 12:04 PM

## 2018-03-22 NOTE — Progress Notes (Addendum)
Progress Note  Patient Name: Kara Hanson Date of Encounter: 03/22/2018  Primary Cardiologist: Candee Furbish, MD   Subjective   She reports improvement in breathing though not back to baseline. She states she only urinated 2 times yesterday which is unusual for her. She denies abdominal pain. No chest pain or palpitations.   Inpatient Medications    Scheduled Meds: . allopurinol  300 mg Oral Daily  . amLODipine  5 mg Oral Daily  . aspirin EC  81 mg Oral Daily  . carvedilol  12.5 mg Oral BID WC  . enoxaparin (LOVENOX) injection  40 mg Subcutaneous Q24H  . furosemide  40 mg Intravenous BID  . gabapentin  300 mg Oral BID  . insulin aspart  0-9 Units Subcutaneous TID WC  . insulin glargine  35 Units Subcutaneous QHS  . pantoprazole  20 mg Oral Daily  . sodium chloride flush  3 mL Intravenous Q12H   Continuous Infusions: . sodium chloride     PRN Meds: sodium chloride, acetaminophen, ondansetron (ZOFRAN) IV, sodium chloride flush, tiZANidine   Vital Signs    Vitals:   03/21/18 1331 03/21/18 2059 03/22/18 0543 03/22/18 1158  BP: (!) 144/71 128/67 (!) 167/80   Pulse: 76 82 79   Resp:  18 18   Temp: (!) 97.4 F (36.3 C) 98.9 F (37.2 C) 98.1 F (36.7 C)   TempSrc: Oral Oral Oral   SpO2: 98% 97% 95% 93%  Weight:   77.7 kg   Height:        Intake/Output Summary (Last 24 hours) at 03/22/2018 1239 Last data filed at 03/22/2018 1151 Gross per 24 hour  Intake -  Output 1101 ml  Net -1101 ml   Filed Weights   03/20/18 0033 03/21/18 0511 03/22/18 0543  Weight: 72.6 kg 78.7 kg 77.7 kg    Telemetry    Sinus rhythm - Personally Reviewed  Physical Exam   GEN: Sitting upright in bedside chair eating lunch o acute distress.   Neck: No JVD, no carotid bruits Cardiac: RRR, no murmurs, rubs, or gallops.  Respiratory: crackles at lung bases GI: NABS, Soft, nontender, non-distended  MS: trace edema; No deformity. Neuro:  Nonfocal, moving all extremities  spontaneously Psych: Normal affect   Labs    Chemistry Recent Labs  Lab 03/20/18 0054 03/21/18 0616 03/22/18 0548  NA 134* 134* 135  K 3.6 3.6 4.0  CL 104 99 100  CO2 22 26 27   GLUCOSE 313* 212* 147*  BUN 27* 37* 47*  CREATININE 1.17* 1.36* 1.44*  CALCIUM 8.6* 8.7* 8.9  GFRNONAA 50* 41* 39*  GFRAA 57* 48* 45*  ANIONGAP 8 9 8      Hematology Recent Labs  Lab 03/20/18 0054 03/20/18 0447  WBC 7.2 6.3  RBC 4.02 3.92  HGB 11.0* 10.8*  HCT 34.2* 33.5*  MCV 85.1 85.5  MCH 27.4 27.6  MCHC 32.2 32.2  RDW 14.0 14.1  PLT 238 223    Cardiac Enzymes Recent Labs  Lab 03/20/18 0447 03/20/18 1035  TROPONINI <0.03 <0.03    Recent Labs  Lab 03/20/18 0058  TROPIPOC 0.01     BNP Recent Labs  Lab 03/20/18 0054  BNP 218.7*     DDimer No results for input(s): DDIMER in the last 168 hours.   Radiology    No results found.  Cardiac Studies   Echocardiogram 03/21/18: Study Conclusions  - Left ventricle: The cavity size was normal. Systolic function was   at the lower limits  of normal. The estimated ejection fraction   was in the range of 50% to 55%. Wall motion was normal; there   were no regional wall motion abnormalities. Features are   consistent with a pseudonormal left ventricular filling pattern,   with concomitant abnormal relaxation and increased filling   pressure (grade 2 diastolic dysfunction).   Patient Profile     62 y.o.femalewith a PMH of chronic diastolic CHF, stress induced cardiomyopathy, HTN, HLD, DM type 2, and history of CVAwho is being followed by cardiology for the evaluation of acute on chronic diastolic CHF.  Assessment & Plan    1. Acute on chronic diastolic CHF: p/w SOB, LE edema, weight gain, and orthopnea. Prior history of takotsubo cardiomyopathy. Last echo 11/2017 with EF 55-60% with G1DD and mild AS. CXR this admission with pulmonary edema. She has been on IV lasix 40mg  BID - I&Os inaccurate with unmeasured urine output,  though she mentions only urinating twice yesterday. Cr uptrending 1.17>1.36>1.44 today. Wt 173lb on admission to 171lbs today. Still with some crackles at lung bases - Continue IV lasix 40mg  BID for now - Continue to monitor strict I&Os (encourage patient compliance) and daily weights - RN to bladder scan and perform in/out cath if >300cc  2. HTN: BP still elevated although improved from admission. Started on amlodipine 03/20/18. Hopeful to restart ARB after diuresis once Cr recovers.  - Continue amlodipine, carvedilol, and lasix  3. Acute on chronic renal insufficiency: Cr 1.15 on admission, up to 1.44 today with IV diuresis.  - Continue to monitor closely with diuresis  4. HLD:no lipids on file - Continue atorvastatin  5. DM type 2:A1C 9.6 11/2017 - Continue management per primary team  6. History of CVA: - Continue aspirin and atorvastatin  7. Suspected BBC:[id number] chronic orthopnea and PND - Would benefit from a sleep study at discharge  For questions or updates, please contact [place] Please consult [email or web address] for contact info under Cardiology/STEMI.      Signed, Abigail Butts, PA-C  03/22/2018, 12:39 PM   [phone]  I have seen and examined the patient along with Abigail Butts, PA-C .  I have reviewed the chart, notes and new data.  I agree with PA's note.  Key new complaints: making slow progress Key examination changes: not overtly volume overloaded clinically, other than lung crackles Key new findings / data: creat increased a little more  PLAN: Will continue IV diuretics another 24 h.  Sanda Klein, MD, [place] [phone] 03/22/2018, 3:17 PM

## 2018-03-22 NOTE — Progress Notes (Signed)
Bladder scanned patient which yielded 190-225cc. Patient denied any feeling of not completing emptying her bladder when she voids and feeling as if she has a full bladder. MD made aware. Placed orders to bladder scan prn and in/out cath if patient is retaining >300 cc. Will continue to monitor.

## 2018-03-23 LAB — COMPREHENSIVE METABOLIC PANEL
ALBUMIN: 2.5 g/dL — AB (ref 3.5–5.0)
ALT: 12 U/L (ref 0–44)
ANION GAP: 12 (ref 5–15)
AST: 12 U/L — ABNORMAL LOW (ref 15–41)
Alkaline Phosphatase: 64 U/L (ref 38–126)
BUN: 46 mg/dL — ABNORMAL HIGH (ref 8–23)
CALCIUM: 8.9 mg/dL (ref 8.9–10.3)
CO2: 27 mmol/L (ref 22–32)
Chloride: 96 mmol/L — ABNORMAL LOW (ref 98–111)
Creatinine, Ser: 1.44 mg/dL — ABNORMAL HIGH (ref 0.44–1.00)
GFR calc Af Amer: 45 mL/min — ABNORMAL LOW (ref >60)
GFR calc non Af Amer: 39 mL/min — ABNORMAL LOW (ref >60)
GLUCOSE: 130 mg/dL — AB (ref 70–99)
Potassium: 3.9 mmol/L (ref 3.5–5.1)
SODIUM: 135 mmol/L (ref 135–145)
TOTAL PROTEIN: 6 g/dL — AB (ref 6.5–8.1)
Total Bilirubin: 0.3 mg/dL (ref 0.3–1.2)

## 2018-03-23 LAB — CBC
HCT: 33.6 % — ABNORMAL LOW (ref 36.0–46.0)
Hemoglobin: 10.6 g/dL — ABNORMAL LOW (ref 12.0–15.0)
MCH: 27.5 pg (ref 26.0–34.0)
MCHC: 31.5 g/dL (ref 30.0–36.0)
MCV: 87 fL (ref 80.0–100.0)
Platelets: 264 10*3/uL (ref 150–400)
RBC: 3.86 MIL/uL — ABNORMAL LOW (ref 3.87–5.11)
RDW: 13.9 % (ref 11.5–15.5)
WBC: 6.8 10*3/uL (ref 4.0–10.5)
nRBC: 0 % (ref 0.0–0.2)

## 2018-03-23 LAB — GLUCOSE, CAPILLARY
Glucose-Capillary: 113 mg/dL — ABNORMAL HIGH (ref 70–99)
Glucose-Capillary: 132 mg/dL — ABNORMAL HIGH (ref 70–99)
Glucose-Capillary: 154 mg/dL — ABNORMAL HIGH (ref 70–99)

## 2018-03-23 LAB — MAGNESIUM: Magnesium: 1.8 mg/dL (ref 1.7–2.4)

## 2018-03-23 MED ORDER — AMLODIPINE BESYLATE 5 MG PO TABS
5.0000 mg | ORAL_TABLET | Freq: Every day | ORAL | Status: DC
  Administered 2018-03-23: 5 mg via ORAL
  Filled 2018-03-23: qty 1

## 2018-03-23 NOTE — Progress Notes (Signed)
Physical Therapy Treatment Patient Details Name: Kara Hanson MRN: [id number] DOB: 1955/09/06 Today's Date: 03/23/2018    History of Present Illness 62 yo female admitted with respiratory failure, pulmonary edema, CHF, LE swelling. Hx of CHF, CVA, DM, MI, Takotsubo cardiomyopathy, spinal stenosis    PT Comments    Pt continues to participate well. She c/o lightheadedness today resulting in some unsteadiness during ambulation. O2 sat 90% on RA, dyspnea 2/4. Will continue to follow.   Follow Up Recommendations  Home health PT;Supervision - Intermittent     Equipment Recommendations  Rolling walker with 5" wheels    Recommendations for Other Services       Precautions / Restrictions Precautions Precautions: Fall Precaution Comments: monitor O2 Restrictions Weight Bearing Restrictions: No    Mobility  Bed Mobility               General bed mobility comments: pt sitting EOB  Transfers Overall transfer level: Needs assistance   Transfers: Sit to/from Stand Sit to Stand: Supervision         General transfer comment: for safety  Ambulation/Gait Ambulation/Gait assistance: Min guard Gait Distance (Feet): 60 Feet Assistive device: (hallway handrail) Gait Pattern/deviations: Step-through pattern;Decreased stride length     General Gait Details: slow gait speed. very close guarding. pt c/o lightheadedness throughut ambulation distance. she is unsteady without UE support so she used hallway handrail. O2 sat 90% on RA, dyspnea 2/4.    Stairs             Wheelchair Mobility    Modified Rankin (Stroke Patients Only)       Balance Overall balance assessment: History of Falls;Needs assistance           Standing balance-Leahy Scale: Fair                              Cognition Arousal/Alertness: Awake/alert Behavior During Therapy: WFL for tasks assessed/performed Overall Cognitive Status: Within Functional Limits for tasks  assessed                                        Exercises      General Comments        Pertinent Vitals/Pain Pain Assessment: No/denies pain    Home Living                      Prior Function            PT Goals (current goals can now be found in the care plan section) Progress towards PT goals: Progressing toward goals    Frequency    Min 3X/week      PT Plan Current plan remains appropriate    Co-evaluation              AM-PAC PT "6 Clicks" Mobility   Outcome Measure  Help needed turning from your back to your side while in a flat bed without using bedrails?: None Help needed moving from lying on your back to sitting on the side of a flat bed without using bedrails?: None Help needed moving to and from a bed to a chair (including a wheelchair)?: A Little Help needed standing up from a chair using your arms (e.g., wheelchair or bedside chair)?: A Little Help needed to walk in hospital room?: A Little Help needed climbing 3-5 steps  with a railing? : A Little 6 Click Score: 20    End of Session   Activity Tolerance: Patient limited by fatigue Patient left: with nursing/sitter in room(in bathroom with NT)   PT Visit Diagnosis: Difficulty in walking, not elsewhere classified (R26.2);Unsteadiness on feet (R26.81);Muscle weakness (generalized) (M62.81)     Time: 0102-7253 PT Time Calculation (min) (ACUTE ONLY): 8 min  Charges:  $Gait Training: 8-22 mins                        Weston Anna, PT Acute Rehabilitation Services Pager: [phone] Office: [phone]

## 2018-03-23 NOTE — Care Management Important Message (Signed)
Important Message  Patient Details  Name: Kara Hanson MRN: [id number] Date of Birth: 1955/12/21   Medicare Important Message Given:  Yes    Kerin Salen 03/23/2018, 10:46 AMImportant Message  Patient Details  Name: Kara Hanson MRN: [id number] Date of Birth: [birth year]   Medicare Important Message Given:  Yes    Kerin Salen 03/23/2018, 10:46 AM

## 2018-03-23 NOTE — Progress Notes (Signed)
PROGRESS NOTE    Kara Hanson  IPJ:[id number] DOB: [birth year] DOA: 03/20/2018 PCP: [name], MD   Brief Narrative:  Patient is a 62 year old female with known history of CVA as well as congestive heart failure who is here with acute exacerbation of CHF.   Patient reported orthopnea PND and worsening lower extremity edema with atypical chest pain.  She was found at home by her daughter who transported her to the ER.  Her last echocardiogram was in August because of this year showing EF of 55 to 73% with diastolic dysfunction.   Patient admitted and started on IV Lasix. Cardiology has been consulted. Patient has echocardiogram also pending  Assessment & Plan:   Principal Problem:   Acute and chronic respiratory failure with hypoxia (HCC) Active Problems:   Hypertension   Acute on chronic diastolic CHF (congestive heart failure) ([place])   Diabetes mellitus type 2 with complications, uncontrolled (HCC)   CKD (chronic kidney disease), stage III (HCC)   #1 acute exacerbation of diastolic heart failure:   Echo with grade 2 diastolic dysfunction 41/93 Continue lasix 40 IV BID -> possibly transition to PO tomorrow I/O, daily weights (listed weight up since admission, likely inaccurate, continue to follow) Cardiology consulted, appreciate recs  Wt Readings from Last 3 Encounters:  03/22/18 77.7 kg  01/02/18 80.5 kg  12/13/17 80.5 kg    #2 hypertension:  Amlodipine, coreg, lasix.  Holding lisinopril at this time.   #3 diabetes: Continue sliding scale insulin with home regimen.  #4 chronic kidney disease stage III: creatinine rising with diuresis.  Follow closely.   DVT prophylaxis: lovenox Code Status: full  Family Communication: none at bedside Disposition Plan: pending improvement in SOB   Consultants:   cardiology  Procedures: Echo Study Conclusions  - Left ventricle: The cavity size was normal. Systolic function was   at the lower limits of normal. The  estimated ejection fraction   was in the range of 50% to 55%. Wall motion was normal; there   were no regional wall motion abnormalities. Features are   consistent with a pseudonormal left ventricular filling pattern,   with concomitant abnormal relaxation and increased filling   pressure (grade 2 diastolic dysfunction).  Antimicrobials:  Anti-infectives (From admission, onward)   None      Subjective: Feels not back to baseline yet as far as her breathing.  Objective: Vitals:   03/22/18 1532 03/22/18 2032 03/23/18 0540 03/23/18 1357  BP:  118/78 (!) 142/68 (!) 150/68  Pulse:  77 79 80  Resp:  18 18 20   Temp:  97.8 F (36.6 C) 98.5 F (36.9 C) 98 F (36.7 C)  TempSrc:  Oral Oral Oral  SpO2: 90% 100% 90% 98%  Weight:      Height:        Intake/Output Summary (Last 24 hours) at 03/23/2018 1735 Last data filed at 03/23/2018 1009 Gross per 24 hour  Intake 600 ml  Output 2200 ml  Net -1600 ml   Filed Weights   03/20/18 0033 03/21/18 0511 03/22/18 0543  Weight: 72.6 kg 78.7 kg 77.7 kg    Examination:  General: No acute distress. Cardiovascular: Heart sounds show a regular rate, and rhythm.  Lungs: faint crackles at bases Abdomen: Soft, nontender, nondistended Neurological: Alert and oriented 3. Moves all extremities 4. Cranial nerves II through XII grossly intact. Skin: Warm and dry. No rashes or lesions. Extremities: No clubbing or cyanosis. No edema.  Psychiatric: Mood and affect are normal. Insight and judgment  are appropriate.    Data Reviewed: I have personally reviewed following labs and imaging studies  CBC: Recent Labs  Lab 03/20/18 0054 03/20/18 0447 03/23/18 0543  WBC 7.2 6.3 6.8  NEUTROABS 5.7 4.6  --   HGB 11.0* 10.8* 10.6*  HCT 34.2* 33.5* 33.6*  MCV 85.1 85.5 87.0  PLT 238 223 109   Basic Metabolic Panel: Recent Labs  Lab 03/20/18 0054 03/21/18 0616 03/22/18 0548 03/23/18 0543  NA 134* 134* 135 135  K 3.6 3.6 4.0 3.9  CL 104  99 100 96*  CO2 22 26 27 27   GLUCOSE 313* 212* 147* 130*  BUN 27* 37* 47* 46*  CREATININE 1.17* 1.36* 1.44* 1.44*  CALCIUM 8.6* 8.7* 8.9 8.9  MG  --   --   --  1.8   GFR: Estimated Creatinine Clearance: 36 mL/min (A) (by C-G formula based on SCr of 1.44 mg/dL (H)). Liver Function Tests: Recent Labs  Lab 03/23/18 0543  AST 12*  ALT 12  ALKPHOS 64  BILITOT 0.3  PROT 6.0*  ALBUMIN 2.5*   No results for input(s): LIPASE, AMYLASE in the last 168 hours. No results for input(s): AMMONIA in the last 168 hours. Coagulation Profile: No results for input(s): INR, PROTIME in the last 168 hours. Cardiac Enzymes: Recent Labs  Lab 03/20/18 0447 03/20/18 1035  TROPONINI <0.03 <0.03   BNP (last 3 results) No results for input(s): PROBNP in the last 8760 hours. HbA1C: No results for input(s): HGBA1C in the last 72 hours. CBG: Recent Labs  Lab 03/22/18 1147 03/22/18 1623 03/22/18 2033 03/23/18 1257 03/23/18 1616  GLUCAP 211* 228* 223* 113* 132*   Lipid Profile: No results for input(s): CHOL, HDL, LDLCALC, TRIG, CHOLHDL, LDLDIRECT in the last 72 hours. Thyroid Function Tests: No results for input(s): TSH, T4TOTAL, FREET4, T3FREE, THYROIDAB in the last 72 hours. Anemia Panel: No results for input(s): VITAMINB12, FOLATE, FERRITIN, TIBC, IRON, RETICCTPCT in the last 72 hours. Sepsis Labs: No results for input(s): PROCALCITON, LATICACIDVEN in the last 168 hours.  No results found for this or any previous visit (from the past 240 hour(s)).       Radiology Studies: No results found.      Scheduled Meds: . allopurinol  300 mg Oral Daily  . amLODipine  5 mg Oral QHS  . aspirin EC  81 mg Oral Daily  . carvedilol  12.5 mg Oral BID WC  . enoxaparin (LOVENOX) injection  40 mg Subcutaneous Q24H  . furosemide  40 mg Intravenous BID  . gabapentin  300 mg Oral BID  . insulin aspart  0-9 Units Subcutaneous TID WC  . insulin glargine  35 Units Subcutaneous QHS  . pantoprazole   20 mg Oral Daily  . sodium chloride flush  3 mL Intravenous Q12H   Continuous Infusions: . sodium chloride       LOS: 3 days    Time spent: over 30 min    Fayrene Helper, MD [facility] Pager [phone]  If 7PM-7AM, please contact night-coverage [email or web address] Password [facility] 03/23/2018, 5:35 PM

## 2018-03-23 NOTE — Progress Notes (Addendum)
Progress Note  Patient Name: Kara Hanson Date of Encounter: 03/23/2018  Primary Cardiologist: Candee Furbish, MD   Subjective   Breathing better, but nowhere near her baseline.  No chest pain.  Inpatient Medications    Scheduled Meds: . allopurinol  300 mg Oral Daily  . amLODipine  5 mg Oral Daily  . aspirin EC  81 mg Oral Daily  . carvedilol  12.5 mg Oral BID WC  . enoxaparin (LOVENOX) injection  40 mg Subcutaneous Q24H  . furosemide  40 mg Intravenous BID  . gabapentin  300 mg Oral BID  . insulin aspart  0-9 Units Subcutaneous TID WC  . insulin glargine  35 Units Subcutaneous QHS  . pantoprazole  20 mg Oral Daily  . sodium chloride flush  3 mL Intravenous Q12H   Continuous Infusions: . sodium chloride     PRN Meds: sodium chloride, acetaminophen, menthol-cetylpyridinium, ondansetron (ZOFRAN) IV, sodium chloride flush, tiZANidine   Vital Signs    Vitals:   03/22/18 1519 03/22/18 1532 03/22/18 2032 03/23/18 0540  BP: (!) 159/71  118/78 (!) 142/68  Pulse: 88  77 79  Resp:   18 18  Temp: 98.5 F (36.9 C)  97.8 F (36.6 C) 98.5 F (36.9 C)  TempSrc: Oral  Oral Oral  SpO2: 90% 90% 100% 90%  Weight:      Height:        Intake/Output Summary (Last 24 hours) at 03/23/2018 1045 Last data filed at 03/23/2018 1009 Gross per 24 hour  Intake 720 ml  Output 2800 ml  Net -2080 ml   Filed Weights   03/20/18 0033 03/21/18 0511 03/22/18 0543  Weight: 72.6 kg 78.7 kg 77.7 kg    Telemetry    Sinus rhythm, no significant ectopy- Personally Reviewed  Physical Exam   General: Well developed, well nourished, female in no acute distress Head: Eyes PERRLA, No xanthomas.   Normocephalic and atraumatic Lungs: Decreased breath sounds bases with a few rales Heart: HRRR S1 S2, without RG.  Soft murmur pulses are 2+ & equal. JVD elevated, approximately 10 cm. Abdomen: Bowel sounds are present, abdomen soft and non-tender without masses or  hernias noted. Msk: Normal  strength and tone for age. Extremities: No clubbing, cyanosis or edema.    Skin:  No rashes or lesions noted. Neuro: Alert and oriented X 3. Psych:  Kermit Balo affect, responds appropriately  Labs    Chemistry Recent Labs  Lab 03/21/18 0616 03/22/18 0548 03/23/18 0543  NA 134* 135 135  K 3.6 4.0 3.9  CL 99 100 96*  CO2 26 27 27   GLUCOSE 212* 147* 130*  BUN 37* 47* 46*  CREATININE 1.36* 1.44* 1.44*  CALCIUM 8.7* 8.9 8.9  PROT  --   --  6.0*  ALBUMIN  --   --  2.5*  AST  --   --  12*  ALT  --   --  12  ALKPHOS  --   --  64  BILITOT  --   --  0.3  GFRNONAA 41* 39* 39*  GFRAA 48* 45* 45*  ANIONGAP 9 8 12      Hematology Recent Labs  Lab 03/20/18 0054 03/20/18 0447 03/23/18 0543  WBC 7.2 6.3 6.8  RBC 4.02 3.92 3.86*  HGB 11.0* 10.8* 10.6*  HCT 34.2* 33.5* 33.6*  MCV 85.1 85.5 87.0  MCH 27.4 27.6 27.5  MCHC 32.2 32.2 31.5  RDW 14.0 14.1 13.9  PLT 238 223 264    Cardiac Enzymes  Recent Labs  Lab 03/20/18 0447 03/20/18 1035  TROPONINI <0.03 <0.03    Recent Labs  Lab 03/20/18 0058  TROPIPOC 0.01     BNP Recent Labs  Lab 03/20/18 0054  BNP 218.7*    No results found for: CHOL, HDL, LDLCALC, LDLDIRECT, TRIG, CHOLHDL  Lab Results  Component Value Date   HGBA1C 9.6 (H) 11/27/2017    Radiology    No results found.  Cardiac Studies   Echocardiogram 03/21/18: Study Conclusions  - Left ventricle: The cavity size was normal. Systolic function was   at the lower limits of normal. The estimated ejection fraction   was in the range of 50% to 55%. Wall motion was normal; there   were no regional wall motion abnormalities. Features are   consistent with a pseudonormal left ventricular filling pattern,   with concomitant abnormal relaxation and increased filling   pressure (grade 2 diastolic dysfunction).   Patient Profile     62 y.o.femalewith a PMH of chronic diastolic CHF, stress induced cardiomyopathy, HTN, HLD, DM type 2, and history of CVAwho  is being followed by cardiology for the evaluation of acute on chronic diastolic CHF.  Assessment & Plan    1. Acute on chronic diastolic CHF:  - prev Takotsubo, admitted w/ volume overload - Cr stable overnight, a little above baseline, continue to follow - believe 12/18 wt 173.6 lbs is accurate, down 2 lbs from that - I/O neg - 3.3 L since admit -Continue IV Lasix for now, dry weight unclear, but her respiratory status is not back to baseline  2. HTN:  - on Norvasc 5 mg, Coreg 12.5 mg bid, Lasix - SBP 118-167 last 24 hr, values seem higher in the morning. -Will change amlodipine to pm, give a dose tonight - will need diuretic at some dose, decide once diuresed.  3. Acute on chronic renal insufficiency:  - Cr 1.15>>1.44 but no change overnight - follow   4. HLD:no lipids on file in Epic or [facility] - Continue atorvastatin -Check lipid profile in am  5. DM type 2:A1C 9.6 11/2017 - Continue management per primary team  6. History of CVA: - Continue aspirin and atorvastatin  7. Suspected WVP:XTGGYIR chronic orthopnea and PND - Would benefit from a sleep study at discharge  For questions or updates, please contact [place] Please consult [email or web address] for contact info under Cardiology/STEMI.      Signed, Rosaria Ferries, PA-C  03/23/2018, 10:45 AM   [phone]  I have seen and examined the patient along with Rosaria Ferries, PA-C .  I have reviewed the chart, notes and new data.  I agree with PA/NP's note.  Key new complaints: Continues to make steady but very slow progress.  Now able to walk without oxygen, but not back to baseline yet. Key examination changes: Still has evidence of elevated JVD, but lungs are now clear.  No edema. Key new findings / data: Creatinine stable over the last 3 days at approximately 1.4, despite 3.3 L net diuresis.  PLAN: I wonder if the previous estimation of her renal function baseline was overly optimistic.  Suspect the current  creatinine level is more reflective of true kidney function.  She will probably be ready to switch back to oral diuretics tomorrow.   Sanda Klein, MD, [place] [phone] 03/23/2018, 1:56 PM

## 2018-03-23 NOTE — Progress Notes (Signed)
SATURATION QUALIFICATIONS: (This note is used to comply with regulatory documentation for home oxygen)  Patient Saturations on Room Air at Rest = 90%  Patient Saturations on Room Air while Ambulating = 89%  Patient Saturations on 2 Liters of oxygen while Ambulating = 94%  Mavi Un, Laurel Dimmer

## 2018-03-24 DIAGNOSIS — E7849 Other hyperlipidemia: Secondary | ICD-10-CM

## 2018-03-24 LAB — BASIC METABOLIC PANEL
Anion gap: 10 (ref 5–15)
BUN: 50 mg/dL — ABNORMAL HIGH (ref 8–23)
CO2: 27 mmol/L (ref 22–32)
Calcium: 8.9 mg/dL (ref 8.9–10.3)
Chloride: 99 mmol/L (ref 98–111)
Creatinine, Ser: 1.58 mg/dL — ABNORMAL HIGH (ref 0.44–1.00)
GFR calc non Af Amer: 34 mL/min — ABNORMAL LOW (ref >60)
GFR, EST AFRICAN AMERICAN: 40 mL/min — AB (ref >60)
Glucose, Bld: 131 mg/dL — ABNORMAL HIGH (ref 70–99)
Potassium: 3.9 mmol/L (ref 3.5–5.1)
Sodium: 136 mmol/L (ref 135–145)

## 2018-03-24 LAB — GLUCOSE, CAPILLARY
Glucose-Capillary: 124 mg/dL — ABNORMAL HIGH (ref 70–99)
Glucose-Capillary: 138 mg/dL — ABNORMAL HIGH (ref 70–99)

## 2018-03-24 LAB — LIPID PANEL
Cholesterol: 295 mg/dL — ABNORMAL HIGH (ref 0–200)
HDL: 45 mg/dL (ref >40)
LDL Cholesterol: 215 mg/dL — ABNORMAL HIGH (ref 0–99)
Total CHOL/HDL Ratio: 6.6 RATIO
Triglycerides: 174 mg/dL — ABNORMAL HIGH (ref <150)
VLDL: 35 mg/dL (ref 0–40)

## 2018-03-24 LAB — HEPATIC FUNCTION PANEL
ALT: 13 U/L (ref 0–44)
AST: 13 U/L — ABNORMAL LOW (ref 15–41)
Albumin: 2.3 g/dL — ABNORMAL LOW (ref 3.5–5.0)
Alkaline Phosphatase: 62 U/L (ref 38–126)
BILIRUBIN DIRECT: 0.1 mg/dL (ref 0.0–0.2)
Indirect Bilirubin: 0.4 mg/dL (ref 0.3–0.9)
Total Bilirubin: 0.5 mg/dL (ref 0.3–1.2)
Total Protein: 5.9 g/dL — ABNORMAL LOW (ref 6.5–8.1)

## 2018-03-24 LAB — CBC
HCT: 34.1 % — ABNORMAL LOW (ref 36.0–46.0)
Hemoglobin: 10.6 g/dL — ABNORMAL LOW (ref 12.0–15.0)
MCH: 27 pg (ref 26.0–34.0)
MCHC: 31.1 g/dL (ref 30.0–36.0)
MCV: 86.8 fL (ref 80.0–100.0)
PLATELETS: 260 10*3/uL (ref 150–400)
RBC: 3.93 MIL/uL (ref 3.87–5.11)
RDW: 13.8 % (ref 11.5–15.5)
WBC: 4.6 10*3/uL (ref 4.0–10.5)
nRBC: 0 % (ref 0.0–0.2)

## 2018-03-24 LAB — MAGNESIUM: Magnesium: 2 mg/dL (ref 1.7–2.4)

## 2018-03-24 MED ORDER — FUROSEMIDE 40 MG PO TABS: 40.0000 mg | ORAL_TABLET | Freq: Two times a day (BID) | ORAL | Status: DC

## 2018-03-24 MED ORDER — GABAPENTIN 300 MG PO CAPS: 300.0000 mg | ORAL_CAPSULE | Freq: Two times a day (BID) | ORAL | 0 refills | Status: DC

## 2018-03-24 MED ORDER — AMLODIPINE BESYLATE 5 MG PO TABS: 5.0000 mg | ORAL_TABLET | Freq: Every day | ORAL | 0 refills | Status: DC

## 2018-03-24 MED ORDER — EZETIMIBE 10 MG PO TABS: 10.0000 mg | ORAL_TABLET | Freq: Every day | ORAL | 0 refills | Status: DC

## 2018-03-24 MED ORDER — FUROSEMIDE 40 MG PO TABS: 40.0000 mg | ORAL_TABLET | Freq: Two times a day (BID) | ORAL | 1 refills | Status: DC

## 2018-03-24 MED ORDER — ATORVASTATIN CALCIUM 40 MG PO TABS: 80.0000 mg | ORAL_TABLET | Freq: Every day | ORAL | Status: DC

## 2018-03-24 MED ORDER — ATORVASTATIN CALCIUM 80 MG PO TABS: 80.0000 mg | ORAL_TABLET | Freq: Every day | ORAL | 0 refills | Status: DC

## 2018-03-24 MED ORDER — EZETIMIBE 10 MG PO TABS
10.0000 mg | ORAL_TABLET | Freq: Every day | ORAL | Status: DC
  Administered 2018-03-24: 10 mg via ORAL
  Filled 2018-03-24: qty 1

## 2018-03-24 NOTE — Progress Notes (Signed)
Pt discharged home today per Dr. Florene Glen. Pt's IV site D/C'd and WDL. Pt's VSS. Pt provided with home medication list, discharge instructions, "Living Better with Heart Failure" Booklet. Verbalized understanding. Pt left floor via WC in stable condition accompanied by NT.

## 2018-03-24 NOTE — Progress Notes (Signed)
SATURATION QUALIFICATIONS: (This note is used to comply with regulatory documentation for home oxygen)  Patient Saturations on Room Air at Rest = 99%  Patient Saturations on Room Air while Ambulating = 94%  Patient Saturations on N/A Liters of oxygen while Ambulating = N/A  Please briefly explain why patient needs home oxygen: Patient will not require home oxygen at discharge.

## 2018-03-24 NOTE — Discharge Summary (Signed)
Physician Discharge Summary  Kara Hanson ZOX:[id number] DOB: [birth year] DOA: 03/20/2018  PCP: [name], MD  Admit date: 03/20/2018 Discharge date: 03/24/2018  Time spent: 35 minutes  Recommendations for Outpatient Follow-up:  1. Follow up outpt CBC/CMP 2. Follow up volume status, need to adjust lasix dose (attention to electrolytes/renal function).  Lisinopril currently on hold due to renal function, follow. 3. Needs outpatient sleep study 4. Follow up HLD with cardiology outpatient, consider PCSK9 inhibitor   Discharge Diagnoses:  Principal Problem:   Acute and chronic respiratory failure with hypoxia ([place]) Active Problems:   Hypertension   Acute on chronic diastolic CHF (congestive heart failure) ([place])   Diabetes mellitus type 2 with complications, uncontrolled ([place])   CKD (chronic kidney disease), stage III [facility])   Discharge Condition: stable  Diet recommendation: heart healthy  Filed Weights   03/21/18 0511 03/22/18 0543 03/24/18 0548  Weight: 78.7 kg 77.7 kg 77.3 kg    History of present illness:  Patient is a 62 year old female with known history of CVA as well as congestive heart failure who is here with acute exacerbation of CHF.Patient reported orthopnea PND and worsening lower extremity edema with atypical chest pain. She was found at home by her daughter who transported her to the ER. Her last echocardiogram was in August because of this year showing EF of 55 to 81% with diastolic dysfunction. Patient admitted and started on IV Lasix. Cardiology has been consulted. Patient has echocardiogram also pending  She was admitted for heart failure exacerbation.  She improved with diuresis.  She was discharged on an increased dose of lasix, 40 mg BID.  She has follow up with her PCP on 12/26.  Encouraged to follow up with PCP and cardiology.  See below for further details  Hospital Course:  #1 acute exacerbation of diastolic heart failure:  Echo  with grade 2 diastolic dysfunction 19/14 Continue lasix 40 IV BID -> 40 PO BID at discharge (she was not diuresing well with 40 daily) I/O, daily weights Weights improving on day of discharge Cardiology consulted, appreciate recs  #2 hypertension:  Amlodipine, coreg, lasix.   Continue to hold lisinopril at discharge, unclear baseline renal function  # Hyperlipidemia: LdL 215 zetia 10 mg daily, atorvastatin 80 mg daily She'll need to follow up with cardiology, may be good candidate for PCSK9 inhibitor outpatient Dr. Debara Pickett recommended advanced lipid clinic referral and possible genetic testing as outpatient  #3 diabetes:Continue sliding scale insulin with home regimen.  #4 chronic kidney disease stage III:  Unclear baseline creatinine, fluctuates looking back from 1.15-1.5.  It's risen with diuresis, but relatively close to ranges it's been before, will need close follow up with change in lasix dose at discharge above   Procedures: Echo Study Conclusions  - Left ventricle: The cavity size was normal. Systolic function was   at the lower limits of normal. The estimated ejection fraction   was in the range of 50% to 55%. Wall motion was normal; there   were no regional wall motion abnormalities. Features are   consistent with a pseudonormal left ventricular filling pattern,   with concomitant abnormal relaxation and increased filling   pressure (grade 2 diastolic dysfunction).  Consultations:  cardiology  Discharge Exam: Vitals:   03/24/18 1112 03/24/18 1327  BP:  130/64  Pulse: 75 74  Resp:    Temp:  97.8 F (36.6 C)  SpO2: 93% 94%   Feeling better. Would like to go home today.  General: No acute distress. Cardiovascular:  Heart sounds show a regular rate, and rhythm.  Lungs: Clear to auscultation bilaterally Abdomen: Soft, nontender, nondistended  Neurological: Alert and oriented 3. Moves all extremities 4. Cranial nerves II through XII grossly intact. Skin:  Warm and dry. No rashes or lesions. Extremities: No clubbing or cyanosis. No edema.  Psychiatric: Mood and affect are normal. Insight and judgment are appropriate.  Discharge Instructions   Discharge Instructions    Diet - low sodium heart healthy   Complete by:  As directed    Discharge instructions   Complete by:  As directed    You were seen for a heart failure exacerbation.  You've improved with diuresis.   Continue lasix 40 mg twice a day at home.  Follow up with your PCP regarding whether this dose is appropriate long term.   Check your weight on your scale when you get home and weigh yourself at the same time daily.  If your weight increases more than 2-3 lbs in one day or more than 5 lbs in one week, please call your PCP or cardiologist to determine whether you need to take extra lasix or come in for an appointment.    Follow up with cardiology regarding your cholesterol, we've made some changes to your medications.  Take your amlodipine at night.  Start ezetimide.  Increase your lipitor to 80 mg daily.  Return for new, recurrent, or worsening symptoms.  Please ask your PCP to request records from this hospitalization so they know what was done and what the next steps will be.   Heart Failure patients record your daily weight using the same scale at the same time of day   Complete by:  As directed    Increase activity slowly   Complete by:  As directed      Allergies as of 03/24/2018      Reactions   Garlic Shortness Of Breath, Itching, Swelling   Hand itching and swelling   Latex Itching   Morphine And Related Itching, Other (See Comments)   Headache   Other Itching   Reaction to newspaper ink -itching and headache      Medication List    STOP taking these medications   lisinopril 10 MG tablet Commonly known as:  PRINIVIL,ZESTRIL   traMADol 50 MG tablet Commonly known as:  ULTRAM     TAKE these medications   allopurinol 300 MG tablet Commonly known  as:  ZYLOPRIM TAKE 1 TABLET (300 MG TOTAL) BY MOUTH DAILY.   amLODipine 5 MG tablet Commonly known as:  NORVASC Take 1 tablet (5 mg total) by mouth at bedtime.   aspirin EC 81 MG tablet Take 81 mg by mouth daily.   atorvastatin 80 MG tablet Commonly known as:  LIPITOR Take 1 tablet (80 mg total) by mouth daily at 6 PM. What changed:    medication strength  how much to take  when to take this   BASAGLAR KWIKPEN 100 UNIT/ML Sopn Inject 35 units into the skin daily. What changed:    how much to take  how to take this  when to take this  additional instructions   carvedilol 12.5 MG tablet Commonly known as:  COREG Take 1 tablet (12.5 mg total) by mouth 2 (two) times daily with a meal.   dicyclomine 20 MG tablet Commonly known as:  BENTYL Take 1 tablet (20 mg total) by mouth 2 (two) times daily.   ezetimibe 10 MG tablet Commonly known as:  ZETIA Take 1 tablet (  10 mg total) by mouth daily. Start taking on:  March 25, 2018   furosemide 40 MG tablet Commonly known as:  LASIX Take 1 tablet (40 mg total) by mouth 2 (two) times daily. What changed:  when to take this   gabapentin 300 MG capsule Commonly known as:  NEURONTIN Take 1 capsule (300 mg total) by mouth 2 (two) times daily.   glucose blood test strip Commonly known as:  ONE TOUCH ULTRA TEST UAD UP TO THREE TIMES DAILY DX E11.4   insulin aspart 100 UNIT/ML injection Commonly known as:  novoLOG 0 to 12 units subcutaneously 3 times daily before meals as per sliding scale   Insulin Pen Needle 31G X 5 MM Misc 1 each by Does not apply route at bedtime.   Lancet Device Misc Use as instructed 3 times daily   lansoprazole 15 MG capsule Commonly known as:  PREVACID Take 1 capsule (15 mg total) by mouth daily.   meclizine 25 MG tablet Commonly known as:  ANTIVERT Take 1 tablet (25 mg total) by mouth 2 (two) times daily as needed. What changed:  reasons to take this   metoCLOPramide 5 MG  tablet Commonly known as:  REGLAN Take 1 tablet (5 mg total) by mouth 3 (three) times daily before meals.   ONE TOUCH ULTRA MINI w/Device Kit UAD UP TO THREE TIMES DAILY DX W11.9   ONETOUCH DELICA LANCETS 14N Misc UAD UP TO THREE TIMES DAILY DX E11.4   sucralfate 1 g tablet Commonly known as:  CARAFATE Take 1 tablet (1 g total) by mouth 4 (four) times daily as needed.   SUPER B COMPLEX/C Caps Take 1 capsule by mouth daily.   tiZANidine 4 MG tablet Commonly known as:  ZANAFLEX Take 1 tablet (4 mg total) by mouth every 8 (eight) hours as needed for muscle spasms.      Allergies  Allergen Reactions  . Garlic Shortness Of Breath, Itching and Swelling    Hand itching and swelling  . Latex Itching  . Morphine And Related Itching and Other (See Comments)    Headache   . Other Itching    Reaction to newspaper ink -itching and headache   Follow-up Information    [facility] Follow up.   Why:  HH nursing/physical therapy Contact information: [place] [address] [phone]        [name], MD Follow up.   Specialty:  Family Medicine Contact information: [place] [address] [phone]        Jerline Pain, MD Follow up.   Specialty:  Cardiology Why:  Please call for a follow up appointment Ask about follow up with Dr. Debara Pickett for your cholesterol.  Contact information: [address] [place]  [address] [phone]            The results of significant diagnostics from this hospitalization (including imaging, microbiology, ancillary and laboratory) are listed below for reference.    Significant Diagnostic Studies: Dg Chest 2 View  Result Date: 03/20/2018 CLINICAL DATA:  Shortness of breath. EXAM: CHEST - 2 VIEW COMPARISON:  Radiograph 01/02/2018 FINDINGS: Mild cardiomegaly is similar. Peribronchial thickening is suspicious for pulmonary edema. Small pleural  effusions. No focal airspace disease. No pneumothorax. Degenerative change in the spine. IMPRESSION: Findings suggesting CHF. Mild cardiomegaly and small pleural effusions. Peribronchial thickening is suspicious for pulmonary edema. Electronically Signed   By: Keith Rake M.D.   On: 03/20/2018  01:59    Microbiology: No results found for this or any previous visit (from the past 240 hour(s)).   Labs: Basic Metabolic Panel: Recent Labs  Lab 03/20/18 0054 03/21/18 0616 03/22/18 0548 03/23/18 0543 03/24/18 0551  NA 134* 134* 135 135 136  K 3.6 3.6 4.0 3.9 3.9  CL 104 99 100 96* 99  CO2 22 26 27 27 27   GLUCOSE 313* 212* 147* 130* 131*  BUN 27* 37* 47* 46* 50*  CREATININE 1.17* 1.36* 1.44* 1.44* 1.58*  CALCIUM 8.6* 8.7* 8.9 8.9 8.9  MG  --   --   --  1.8 2.0   Liver Function Tests: Recent Labs  Lab 03/23/18 0543 03/24/18 0551  AST 12* 13*  ALT 12 13  ALKPHOS 64 62  BILITOT 0.3 0.5  PROT 6.0* 5.9*  ALBUMIN 2.5* 2.3*   No results for input(s): LIPASE, AMYLASE in the last 168 hours. No results for input(s): AMMONIA in the last 168 hours. CBC: Recent Labs  Lab 03/20/18 0054 03/20/18 0447 03/23/18 0543 03/24/18 0551  WBC 7.2 6.3 6.8 4.6  NEUTROABS 5.7 4.6  --   --   HGB 11.0* 10.8* 10.6* 10.6*  HCT 34.2* 33.5* 33.6* 34.1*  MCV 85.1 85.5 87.0 86.8  PLT 238 223 264 260   Cardiac Enzymes: Recent Labs  Lab 03/20/18 0447 03/20/18 1035  TROPONINI <0.03 <0.03   BNP: BNP (last 3 results) Recent Labs    11/22/17 2330 11/27/17 0111 03/20/18 0054  BNP 128.2* 59.5 218.7*    ProBNP (last 3 results) No results for input(s): PROBNP in the last 8760 hours.  CBG: Recent Labs  Lab 03/23/18 1257 03/23/18 1616 03/23/18 2050 03/24/18 0828 03/24/18 1211  GLUCAP 113* 132* 154* 124* 138*       Signed:  Fayrene Helper MD.  [facility] 03/24/2018, 2:13 PM

## 2018-03-24 NOTE — Progress Notes (Signed)
Progress Note  Patient Name: Kara Hanson Date of Encounter: 03/24/2018  Primary Cardiologist: Candee Furbish, MD   Subjective   Breathing has improved. Diuresed another 2.5L negative overnight. Creatinine increased from 1.44 -> 1.58 overnight. Marked dyslipidemia - LDL 215 (total 295).  Inpatient Medications    Scheduled Meds: . allopurinol  300 mg Oral Daily  . amLODipine  5 mg Oral QHS  . aspirin EC  81 mg Oral Daily  . carvedilol  12.5 mg Oral BID WC  . enoxaparin (LOVENOX) injection  40 mg Subcutaneous Q24H  . furosemide  40 mg Intravenous BID  . gabapentin  300 mg Oral BID  . insulin aspart  0-9 Units Subcutaneous TID WC  . insulin glargine  35 Units Subcutaneous QHS  . pantoprazole  20 mg Oral Daily  . sodium chloride flush  3 mL Intravenous Q12H   Continuous Infusions: . sodium chloride     PRN Meds: sodium chloride, acetaminophen, menthol-cetylpyridinium, ondansetron (ZOFRAN) IV, sodium chloride flush, tiZANidine   Vital Signs    Vitals:   03/23/18 1357 03/23/18 2049 03/24/18 0439 03/24/18 0548  BP: (!) 150/68 125/71 135/68   Pulse: 80 76 72   Resp: 20 20 18    Temp: 98 F (36.7 C) 98.4 F (36.9 C) 97.6 F (36.4 C)   TempSrc: Oral Oral Oral   SpO2: 98% 99% 98%   Weight:    77.3 kg  Height:        Intake/Output Summary (Last 24 hours) at 03/24/2018 0923 Last data filed at 03/24/2018 0740 Gross per 24 hour  Intake 240 ml  Output 2950 ml  Net -2710 ml   Filed Weights   03/21/18 0511 03/22/18 0543 03/24/18 0548  Weight: 78.7 kg 77.7 kg 77.3 kg    Telemetry    Sinus rhythm- Personally Reviewed  Physical Exam   General appearance: alert and no distress Neck: no carotid bruit, no JVD and thyroid not enlarged, symmetric, no tenderness/mass/nodules Lungs: clear to auscultation bilaterally Heart: regular rate and rhythm Abdomen: soft, non-tender; bowel sounds normal; no masses,  no organomegaly Extremities: extremities normal, atraumatic, no  cyanosis or edema Pulses: 2+ and symmetric Skin: Skin color, texture, turgor normal. No rashes or lesions Neurologic: Grossly normal Psych: Pleasant   Labs    Chemistry Recent Labs  Lab 03/22/18 0548 03/23/18 0543 03/24/18 0551  NA 135 135 136  K 4.0 3.9 3.9  CL 100 96* 99  CO2 27 27 PENDING  GLUCOSE 147* 130* 131*  BUN 47* 46* 50*  CREATININE 1.44* 1.44* 1.58*  CALCIUM 8.9 8.9 8.9  PROT  --  6.0* 5.9*  ALBUMIN  --  2.5* 2.3*  AST  --  12* 13*  ALT  --  12 13  ALKPHOS  --  64 62  BILITOT  --  0.3 0.5  GFRNONAA 39* 39* 34*  GFRAA 45* 45* 40*  ANIONGAP 8 12 PENDING     Hematology Recent Labs  Lab 03/20/18 0447 03/23/18 0543 03/24/18 0551  WBC 6.3 6.8 4.6  RBC 3.92 3.86* 3.93  HGB 10.8* 10.6* 10.6*  HCT 33.5* 33.6* 34.1*  MCV 85.5 87.0 86.8  MCH 27.6 27.5 27.0  MCHC 32.2 31.5 31.1  RDW 14.1 13.9 13.8  PLT 223 264 260    Cardiac Enzymes Recent Labs  Lab 03/20/18 0447 03/20/18 1035  TROPONINI <0.03 <0.03    Recent Labs  Lab 03/20/18 0058  TROPIPOC 0.01     BNP Recent Labs  Lab 03/20/18  0054  BNP 218.7*    Lab Results  Component Value Date   CHOL 295 (H) 03/24/2018   HDL 45 03/24/2018   LDLCALC 215 (H) 03/24/2018   TRIG 174 (H) 03/24/2018   CHOLHDL 6.6 03/24/2018    Lab Results  Component Value Date   HGBA1C 9.6 (H) 11/27/2017    Radiology    No results found.  Cardiac Studies   Echocardiogram 03/21/18: Study Conclusions  - Left ventricle: The cavity size was normal. Systolic function was   at the lower limits of normal. The estimated ejection fraction   was in the range of 50% to 55%. Wall motion was normal; there   were no regional wall motion abnormalities. Features are   consistent with a pseudonormal left ventricular filling pattern,   with concomitant abnormal relaxation and increased filling   pressure (grade 2 diastolic dysfunction).   Patient Profile     62 y.o.femalewith a PMH of chronic diastolic CHF,  stress induced cardiomyopathy, HTN, HLD, DM type 2, and history of CVAwho is being followed by cardiology for the evaluation of acute on chronic diastolic CHF.  Assessment & Plan    1. Acute on chronic diastolic CHF:  - prev Takotsubo, admitted w/ volume overload - Cr stable overnight, a little above baseline, continue to follow - believe 12/18 wt 173.6 lbs is accurate, down 2 lbs from that - I/O neg - 5 L since admit -Switch to oral lasix today - 40 mg po BID.  2. HTN:  - on Norvasc 5 mg, Coreg 12.5 mg bid, Lasix  - SBP 118-167 last 24 hr, values seem higher in the morning. -Will change amlodipine to pm, give a dose tonight - will need diuretic at some dose, decide once diuresed.  3. Acute on chronic renal insufficiency:  - Cr increased further overnight - unclear baseline, remains volume overloaded.  4. FOY:DXAJOI dyslipidemia - ?statin compliance, ran out of meds. Suspect probable HeFH based on numbers with LDL 215 - also family history of high cholesterol in father, brother, and that side of the family. Goal LDL <70 given history of stroke. - Will need high potency statin - atorvastatin 80 mg daily. - Add ezetimibe 10 mg daily. -Good candidate for PCSK9 inhibitor- consider this as outpatient. - Recommend advanced lipid clinic referral and possible genetic testing as outpatient - I'm happy to see her for this in follow-up.  5. DM type 2:A1C 9.6 11/2017 - Continue management per primary team  6. History of CVA: - Continue aspirin and optimize lipids  7. Suspected NOM:VEHMCNO chronic orthopnea and PND - Would benefit from a sleep study at discharge  For questions or updates, please contact [place] Please consult [email or web address] for contact info under Cardiology/STEMI.   Pixie Casino, MD, [facility], [place] Director of the Advanced Lipid Disorders &  Cardiovascular Risk Reduction Clinic Diplomate of the American Board of  Clinical Lipidology Attending Cardiologist  Direct Dial: [phone]  Fax: [phone]  Website:  [email or web address]  Pixie Casino, MD  03/24/2018, 9:23 AM

## 2018-03-26 ENCOUNTER — Telehealth: Payer: Self-pay

## 2018-03-26 NOTE — Telephone Encounter (Signed)
Transition Care Management Follow-up Telephone Call Date of discharge and from where: 03/24/2018 - [facility]  Call placed to patient # [phone] and a message was left requesting a call back to this CM # [phone].

## 2018-03-29 ENCOUNTER — Ambulatory Visit: Payer: Medicare HMO | Attending: Family Medicine | Admitting: Family Medicine

## 2018-03-29 ENCOUNTER — Telehealth: Payer: Self-pay

## 2018-03-29 ENCOUNTER — Encounter: Payer: Self-pay | Admitting: Family Medicine

## 2018-03-29 VITALS — BP 136/83 | HR 92 | Temp 97.7°F | Ht 59.0 in | Wt 173.8 lb

## 2018-03-29 DIAGNOSIS — M4802 Spinal stenosis, cervical region: Secondary | ICD-10-CM | POA: Insufficient documentation

## 2018-03-29 DIAGNOSIS — M5126 Other intervertebral disc displacement, lumbar region: Secondary | ICD-10-CM | POA: Insufficient documentation

## 2018-03-29 DIAGNOSIS — R0600 Dyspnea, unspecified: Secondary | ICD-10-CM | POA: Diagnosis not present

## 2018-03-29 DIAGNOSIS — I252 Old myocardial infarction: Secondary | ICD-10-CM | POA: Insufficient documentation

## 2018-03-29 DIAGNOSIS — Z8673 Personal history of transient ischemic attack (TIA), and cerebral infarction without residual deficits: Secondary | ICD-10-CM | POA: Insufficient documentation

## 2018-03-29 DIAGNOSIS — R531 Weakness: Secondary | ICD-10-CM | POA: Insufficient documentation

## 2018-03-29 DIAGNOSIS — E1165 Type 2 diabetes mellitus with hyperglycemia: Secondary | ICD-10-CM | POA: Insufficient documentation

## 2018-03-29 DIAGNOSIS — M4800 Spinal stenosis, site unspecified: Secondary | ICD-10-CM | POA: Insufficient documentation

## 2018-03-29 DIAGNOSIS — Z1159 Encounter for screening for other viral diseases: Secondary | ICD-10-CM

## 2018-03-29 DIAGNOSIS — R600 Localized edema: Secondary | ICD-10-CM | POA: Diagnosis not present

## 2018-03-29 DIAGNOSIS — R0601 Orthopnea: Secondary | ICD-10-CM

## 2018-03-29 DIAGNOSIS — Z7982 Long term (current) use of aspirin: Secondary | ICD-10-CM | POA: Insufficient documentation

## 2018-03-29 DIAGNOSIS — R29818 Other symptoms and signs involving the nervous system: Secondary | ICD-10-CM

## 2018-03-29 DIAGNOSIS — Z794 Long term (current) use of insulin: Secondary | ICD-10-CM | POA: Insufficient documentation

## 2018-03-29 DIAGNOSIS — I5033 Acute on chronic diastolic (congestive) heart failure: Secondary | ICD-10-CM | POA: Insufficient documentation

## 2018-03-29 DIAGNOSIS — E1121 Type 2 diabetes mellitus with diabetic nephropathy: Secondary | ICD-10-CM | POA: Insufficient documentation

## 2018-03-29 DIAGNOSIS — IMO0002 Reserved for concepts with insufficient information to code with codable children: Secondary | ICD-10-CM

## 2018-03-29 DIAGNOSIS — R5383 Other fatigue: Secondary | ICD-10-CM | POA: Diagnosis not present

## 2018-03-29 DIAGNOSIS — E7800 Pure hypercholesterolemia, unspecified: Secondary | ICD-10-CM | POA: Insufficient documentation

## 2018-03-29 DIAGNOSIS — I129 Hypertensive chronic kidney disease with stage 1 through stage 4 chronic kidney disease, or unspecified chronic kidney disease: Secondary | ICD-10-CM

## 2018-03-29 DIAGNOSIS — E118 Type 2 diabetes mellitus with unspecified complications: Secondary | ICD-10-CM

## 2018-03-29 DIAGNOSIS — E1140 Type 2 diabetes mellitus with diabetic neuropathy, unspecified: Secondary | ICD-10-CM | POA: Insufficient documentation

## 2018-03-29 DIAGNOSIS — I10 Essential (primary) hypertension: Secondary | ICD-10-CM

## 2018-03-29 DIAGNOSIS — I110 Hypertensive heart disease with heart failure: Secondary | ICD-10-CM | POA: Insufficient documentation

## 2018-03-29 DIAGNOSIS — Z9119 Patient's noncompliance with other medical treatment and regimen: Secondary | ICD-10-CM | POA: Insufficient documentation

## 2018-03-29 DIAGNOSIS — Z79899 Other long term (current) drug therapy: Secondary | ICD-10-CM | POA: Insufficient documentation

## 2018-03-29 DIAGNOSIS — M5136 Other intervertebral disc degeneration, lumbar region: Secondary | ICD-10-CM

## 2018-03-29 DIAGNOSIS — E8809 Other disorders of plasma-protein metabolism, not elsewhere classified: Secondary | ICD-10-CM

## 2018-03-29 LAB — POCT GLYCOSYLATED HEMOGLOBIN (HGB A1C): Hemoglobin A1C: 11.8 % — AB (ref 4.0–5.6)

## 2018-03-29 LAB — GLUCOSE, POCT (MANUAL RESULT ENTRY): POC Glucose: 193 mg/dl — AB (ref 70–99)

## 2018-03-29 MED ORDER — BASAGLAR KWIKPEN 100 UNIT/ML ~~LOC~~ SOPN: 35.0000 [IU] | PEN_INJECTOR | Freq: Every day | SUBCUTANEOUS | 6 refills | Status: DC

## 2018-03-29 MED ORDER — LANSOPRAZOLE 15 MG PO CPDR: 15.0000 mg | DELAYED_RELEASE_CAPSULE | Freq: Every day | ORAL | 6 refills | Status: DC

## 2018-03-29 MED ORDER — GABAPENTIN 300 MG PO CAPS: 300.0000 mg | ORAL_CAPSULE | Freq: Two times a day (BID) | ORAL | 1 refills | Status: DC

## 2018-03-29 MED ORDER — GLUCOSE BLOOD VI STRP: ORAL_STRIP | 12 refills | Status: DC

## 2018-03-29 MED ORDER — CARVEDILOL 12.5 MG PO TABS: 12.5000 mg | ORAL_TABLET | Freq: Two times a day (BID) | ORAL | 1 refills | Status: DC

## 2018-03-29 MED ORDER — TIZANIDINE HCL 4 MG PO TABS: 4.0000 mg | ORAL_TABLET | Freq: Three times a day (TID) | ORAL | 2 refills | Status: DC | PRN

## 2018-03-29 MED ORDER — INSULIN ASPART 100 UNIT/ML ~~LOC~~ SOLN: SUBCUTANEOUS | 6 refills | Status: DC

## 2018-03-29 MED ORDER — AMLODIPINE BESYLATE 5 MG PO TABS: 5.0000 mg | ORAL_TABLET | Freq: Every day | ORAL | 1 refills | Status: DC

## 2018-03-29 NOTE — Progress Notes (Signed)
[place]  Date of Telephone Encounter: 03/26/2018  Date of 1st service: 03/29/2018   Admit Date: 03/20/2018 Discharge Date: 03/24/2018    Subjective:  Patient ID: Kara Hanson, female    DOB: 05-13-1955  Age: 62 y.o. MRN: [id number]  CC: Hospitalization Follow-up and Diabetes   HPI Kara Hanson is a 62 year old female with a history of type 2 diabetes mellitus (A1c 9.6), hypertension, congestive heart failure (EF 50-55% from echo of 03/2018) who presents today for a follow-up visit at the transitional care clinic. She had a recent hospitalization at [facility] for acute respiratory distress secondary to CHF exacerbation.  Chest x-ray revealed findings suggestive of CHF, mild cardiomegaly and small pleural effusions. She was commenced on IV Lasix with improvement in diuresis; lisinopril held due to creatinine trending up.  (Creatinine trended up from a baseline of 0.8-1.53 during hospitalization) Echo revealed EF of 50 to 41%, grade 2 diastolic dysfunction. Lipid panel revealed an LDL of 215 and cardiology recommended evaluation at the lipid clinic for possible genetic testing and commencement on PCSK9 inhibitor.  She was subsequently discharged on increased dose of Lasix.  She presents today complaining of persisting dyspnea even on mild exertion as she could hardly walk down the aisles at [place].  She complains of generalized edema despite compliance with her Lasix.  She does have orthopnea and wakes up at night several times to catch her breath; denies chest pain.  Also has associated insomnia with daytime somnolence and fatigue. She complains of both legs feeling weak and generalized fatigue.  Of note I had referred her to PT at her last visit with me 3 months ago and also referred her to cardiology however she never followed through. Her A1c is significantly elevated at 11.8 and he informs me she has not been taking Basaglar " because only Lantus  works for me" but she has been taking her NovoLog sliding scale. She complains of intermittent neck pain from her cervical spinal stenosis and intermittent low back pain from her bulging lumbar disc; received epidural spinal injections in the past. Denies radiation of pain down her upper extremities or her down her lower extremities  Past Medical History:  Diagnosis Date  . CHF (congestive heart failure) ([place])   . CVA (cerebral vascular accident) ([place])   . Diabetes mellitus without complication ([place])   . Hypercholesteremia   . Hypertension   . Myocardial infarction ([place])   . Spinal stenosis     Past Surgical History:  Procedure Laterality Date  . BLADDER SURGERY    . CARDIAC CATHETERIZATION N/A 04/25/2016   Procedure: Left Heart Cath and Coronary Angiography;  Surgeon: Lorretta Harp, MD;  Location: [facility];  Service: Cardiovascular;  Laterality: N/A;  . CESAREAN SECTION    . CHOLECYSTECTOMY      Allergies  Allergen Reactions  . Garlic Shortness Of Breath, Itching and Swelling    Hand itching and swelling  . Latex Itching  . Morphine And Related Itching and Other (See Comments)    Headache   . Other Itching    Reaction to newspaper ink -itching and headache     Outpatient Medications Prior to Visit  Medication Sig Dispense Refill  . allopurinol (ZYLOPRIM) 300 MG tablet TAKE 1 TABLET (300 MG TOTAL) BY MOUTH DAILY. 90 tablet 1  . aspirin EC 81 MG tablet Take 81 mg by mouth daily.    Marland Kitchen atorvastatin (LIPITOR) 80 MG tablet Take 1 tablet (80 mg total)  by mouth daily at 6 PM. 30 tablet 0  . Blood Glucose Monitoring Suppl (ONE TOUCH ULTRA MINI) w/Device KIT UAD UP TO THREE TIMES DAILY DX E11.4 1 each 0  . carvedilol (COREG) 12.5 MG tablet Take 1 tablet (12.5 mg total) by mouth 2 (two) times daily with a meal. 180 tablet 1  . ezetimibe (ZETIA) 10 MG tablet Take 1 tablet (10 mg total) by mouth daily. 30 tablet 0  . furosemide (LASIX) 40 MG tablet Take 1 tablet (40 mg  total) by mouth 2 (two) times daily. 180 tablet 1  . glucose blood (ONE TOUCH ULTRA TEST) test strip UAD UP TO THREE TIMES DAILY DX E11.4 100 each 2  . Insulin Pen Needle 31G X 5 MM MISC 1 each by Does not apply route at bedtime. 30 each 5  . Lancet Device MISC Use as instructed 3 times daily 1 each 0  . ONETOUCH DELICA LANCETS 74Y MISC UAD UP TO THREE TIMES DAILY DX E11.4 100 each 2  . SUPER B COMPLEX/C CAPS Take 1 capsule by mouth daily.    Marland Kitchen amLODipine (NORVASC) 5 MG tablet Take 1 tablet (5 mg total) by mouth at bedtime. 30 tablet 0  . gabapentin (NEURONTIN) 300 MG capsule Take 1 capsule (300 mg total) by mouth 2 (two) times daily. 60 capsule 0  . insulin aspart (NOVOLOG) 100 UNIT/ML injection 0 to 12 units subcutaneously 3 times daily before meals as per sliding scale 10 mL 1  . Insulin Glargine (BASAGLAR KWIKPEN) 100 UNIT/ML SOPN Inject 35 units into the skin daily. (Patient taking differently: Inject 35 Units into the skin at bedtime. ) 15 mL 3  . lansoprazole (PREVACID) 15 MG capsule Take 1 capsule (15 mg total) by mouth daily. 30 capsule 3  . meclizine (ANTIVERT) 25 MG tablet Take 1 tablet (25 mg total) by mouth 2 (two) times daily as needed. (Patient taking differently: Take 25 mg by mouth 2 (two) times daily as needed (vertigo). ) 60 tablet 1  . tiZANidine (ZANAFLEX) 4 MG tablet Take 1 tablet (4 mg total) by mouth every 8 (eight) hours as needed for muscle spasms. 90 tablet 2  . dicyclomine (BENTYL) 20 MG tablet Take 1 tablet (20 mg total) by mouth 2 (two) times daily. (Patient not taking: Reported on 03/20/2018) 20 tablet 0  . metoCLOPramide (REGLAN) 5 MG tablet Take 1 tablet (5 mg total) by mouth 3 (three) times daily before meals. (Patient not taking: Reported on 03/20/2018) 90 tablet 1  . sucralfate (CARAFATE) 1 g tablet Take 1 tablet (1 g total) by mouth 4 (four) times daily as needed. (Patient not taking: Reported on 03/20/2018) 30 tablet 0   No facility-administered medications  prior to visit.     ROS Review of Systems  Constitutional: Positive for fatigue. Negative for activity change and appetite change.  HENT: Negative for congestion, sinus pressure and sore throat.   Eyes: Negative for visual disturbance.  Respiratory: Positive for shortness of breath. Negative for cough, chest tightness and wheezing.   Cardiovascular: Positive for leg swelling. Negative for chest pain and palpitations.  Gastrointestinal: Negative for abdominal distention, abdominal pain and constipation.  Endocrine: Negative for polydipsia.  Genitourinary: Negative for dysuria and frequency.  Musculoskeletal:       See HPI  Skin: Negative for rash.  Neurological: Negative for tremors, light-headedness and numbness.  Hematological: Does not bruise/bleed easily.  Psychiatric/Behavioral: Negative for agitation and behavioral problems.    Objective:  BP 136/83  Pulse 92   Temp 97.7 F (36.5 C) (Oral)   Ht 4' 11"  (1.499 m)   Wt 173 lb 12.8 oz (78.8 kg)   SpO2 96%   BMI 35.10 kg/m   BP/Weight 03/29/2018 03/24/2018 33/0/0762  Systolic BP 263 335 456  Diastolic BP 83 64 87  Wt. (Lbs) 173.8 170.42 -  BMI 35.1 34.42 -      Physical Exam Constitutional:      Appearance: She is well-developed.  Neck:     Vascular: No JVD.  Cardiovascular:     Rate and Rhythm: Normal rate.     Heart sounds: Normal heart sounds. No murmur.  Pulmonary:     Effort: Pulmonary effort is normal.     Breath sounds: Normal breath sounds. No wheezing or rales.  Chest:     Chest wall: No tenderness.  Abdominal:     General: Bowel sounds are normal. There is no distension.     Palpations: Abdomen is soft. There is no mass.     Tenderness: There is no abdominal tenderness.  Musculoskeletal: Normal range of motion.     Comments: 1+ bilateral nonpitting ankle edema Minimal tenderness on palpation to the posterior cervical and lumbar spine  Neurological:     Mental Status: She is alert and oriented  to person, place, and time.  Psychiatric:        Mood and Affect: Mood normal.        Behavior: Behavior normal.    CMP Latest Ref Rng & Units 03/24/2018 03/23/2018 03/22/2018  Glucose 70 - 99 mg/dL 131(H) 130(H) 147(H)  BUN 8 - 23 mg/dL 50(H) 46(H) 47(H)  Creatinine 0.44 - 1.00 mg/dL 1.58(H) 1.44(H) 1.44(H)  Sodium 135 - 145 mmol/L 136 135 135  Potassium 3.5 - 5.1 mmol/L 3.9 3.9 4.0  Chloride 98 - 111 mmol/L 99 96(L) 100  CO2 22 - 32 mmol/L 27 27 27   Calcium 8.9 - 10.3 mg/dL 8.9 8.9 8.9  Total Protein 6.5 - 8.1 g/dL 5.9(L) 6.0(L) -  Total Bilirubin 0.3 - 1.2 mg/dL 0.5 0.3 -  Alkaline Phos 38 - 126 U/L 62 64 -  AST 15 - 41 U/L 13(L) 12(L) -  ALT 0 - 44 U/L 13 12 -    Lipid Panel     Component Value Date/Time   CHOL 295 (H) 03/24/2018 0551   TRIG 174 (H) 03/24/2018 0551   HDL 45 03/24/2018 0551   CHOLHDL 6.6 03/24/2018 0551   VLDL 35 03/24/2018 0551   LDLCALC 215 (H) 03/24/2018 0551    Lab Results  Component Value Date   HGBA1C 11.8 (A) 03/29/2018      Assessment & Plan:   1. Diabetes mellitus type 2 with complications, uncontrolled ([place]) Uncontrolled with A1c of 11.8 due to noncompliance Emphasized the need to be compliant with Basaglar and NovoLog Encouraged to adhere to a diabetic diet and lifestyle modifications - POCT glucose (manual entry) - POCT glycosylated hemoglobin (Hb A1C) - Microalbumin/Creatinine Ratio, Urine - CMP14+EGFR - VITAMIN D 25 Hydroxy (Vit-D Deficiency, Fractures) - Insulin Glargine (BASAGLAR KWIKPEN) 100 UNIT/ML SOPN; Inject 0.35 mLs (35 Units total) into the skin at bedtime.  Dispense: 30 mL; Refill: 6  2. Type 2 diabetes mellitus with diabetic neuropathy, with long-term current use of insulin ([place]) See #1 above - insulin aspart (NOVOLOG) 100 UNIT/ML injection; 0 to 12 units subcutaneously 3 times daily before meals as per sliding scale  Dispense: 30 mL; Refill: 6  3. Bulging lumbar disc Intermittent  pain Will be commencing home  physical therapy - Ambulatory referral to Physical Medicine Rehab - gabapentin (NEURONTIN) 300 MG capsule; Take 1 capsule (300 mg total) by mouth 2 (two) times daily.  Dispense: 180 capsule; Refill: 1 - tiZANidine (ZANAFLEX) 4 MG tablet; Take 1 tablet (4 mg total) by mouth every 8 (eight) hours as needed for muscle spasms.  Dispense: 90 tablet; Refill: 2  4. Suspected sleep apnea - Split night study; Future  5. Hypoalbuminemia Albumin of 2.3 This could also explain the edema We will need to rule out nephrotic syndrome  6. Acute on chronic diastolic CHF (congestive heart failure) (HCC) EF of 50 to 55% from echo of 03/2018 Dyspnea could be due to deconditioning Continue increased dose of Lasix and keep appointment with the CHF clinic Will also be evaluated for initiation of PCSK 9 inhibitor  7. Spinal stenosis of cervical region - Ambulatory referral to Physical Medicine Rehab - gabapentin (NEURONTIN) 300 MG capsule; Take 1 capsule (300 mg total) by mouth 2 (two) times daily.  Dispense: 180 capsule; Refill: 1  8. Hypertension with impaired renal function Last creatinine was 1.58 which is up from 0.93 four months ago Effect of increased Lasix also contributory Combination of hypertensive and diabetic nephropathy as well  9. Need for hepatitis C screening test - Hepatitis c antibody (reflex)  10. Other fatigue - TSH - T4, free We will check vitamin D as well  Case manager called to assist with obtaining cardiology appointment and ensuring her medications from the pharmacy are delivered to her.  Meds ordered this encounter  Medications  . amLODipine (NORVASC) 5 MG tablet    Sig: Take 1 tablet (5 mg total) by mouth at bedtime.    Dispense:  90 tablet    Refill:  1  . gabapentin (NEURONTIN) 300 MG capsule    Sig: Take 1 capsule (300 mg total) by mouth 2 (two) times daily.    Dispense:  180 capsule    Refill:  1  . insulin aspart (NOVOLOG) 100 UNIT/ML injection    Sig: 0 to  12 units subcutaneously 3 times daily before meals as per sliding scale    Dispense:  30 mL    Refill:  6  . Insulin Glargine (BASAGLAR KWIKPEN) 100 UNIT/ML SOPN    Sig: Inject 0.35 mLs (35 Units total) into the skin at bedtime.    Dispense:  30 mL    Refill:  6  . lansoprazole (PREVACID) 15 MG capsule    Sig: Take 1 capsule (15 mg total) by mouth daily.    Dispense:  30 capsule    Refill:  6  . tiZANidine (ZANAFLEX) 4 MG tablet    Sig: Take 1 tablet (4 mg total) by mouth every 8 (eight) hours as needed for muscle spasms.    Dispense:  90 tablet    Refill:  2    Follow-up: Return for coordination of care.   [name] MD

## 2018-03-29 NOTE — Telephone Encounter (Signed)
Call placed to [facility], spoke to [facility] who scheduled patient for appointment with Mauri Pole on 04/25/18 @ 0930. This information was given to patient on today's AVS.  As per Ronny Bacon, a referral is needed for an appointment with Dr Hilty/lipid follow up and Ms Servando Snare will make referral as appropriate.    Call placed to [place], spoke to [place], [facility].  He stated that he will review all new orders from clinic appointment today  and fill/deliver medications as needed. He said that he delivered furosemide and allopurinol on 03/20/18 and basaglar and novolog as well as pen needles on 02/28/18.  He explained that it is too early to refill the basaglar as the patient has enough for 40 days unless the dose is increased.  He also explained that he is in touch with patient's daughter, [name], on a regular basis.  This CM spoke to patient and explained the outcome of above noted calls. She also said that she is currently out of cardedilol and test strips.   A referral was made to home health at discharge. She said that she has not heard from [facility] yet, but they have been in contact with her daughter.  She also noted that she does not have a scale at home.

## 2018-03-29 NOTE — Telephone Encounter (Signed)
Noted.  Requested refills addressed.

## 2018-03-29 NOTE — Patient Instructions (Signed)
Diabetes Mellitus and Nutrition, Adult  When you have diabetes (diabetes mellitus), it is very important to have healthy eating habits because your blood sugar (glucose) levels are greatly affected by what you eat and drink. Eating healthy foods in the appropriate amounts, at about the same times every day, can help you:  · Control your blood glucose.  · Lower your risk of heart disease.  · Improve your blood pressure.  · Reach or maintain a healthy weight.  Every person with diabetes is different, and each person has different needs for a meal plan. Your health care provider may recommend that you work with a diet and nutrition specialist (dietitian) to make a meal plan that is best for you. Your meal plan may vary depending on factors such as:  · The calories you need.  · The medicines you take.  · Your weight.  · Your blood glucose, blood pressure, and cholesterol levels.  · Your activity level.  · Other health conditions you have, such as heart or kidney disease.  How do carbohydrates affect me?  Carbohydrates, also called carbs, affect your blood glucose level more than any other type of food. Eating carbs naturally raises the amount of glucose in your blood. Carb counting is a method for keeping track of how many carbs you eat. Counting carbs is important to keep your blood glucose at a healthy level, especially if you use insulin or take certain oral diabetes medicines.  It is important to know how many carbs you can safely have in each meal. This is different for every person. Your dietitian can help you calculate how many carbs you should have at each meal and for each snack.  Foods that contain carbs include:  · Bread, cereal, rice, pasta, and crackers.  · Potatoes and corn.  · Peas, beans, and lentils.  · Milk and yogurt.  · Fruit and juice.  · Desserts, such as cakes, cookies, ice cream, and candy.  How does alcohol affect me?  Alcohol can cause a sudden decrease in blood glucose (hypoglycemia),  especially if you use insulin or take certain oral diabetes medicines. Hypoglycemia can be a life-threatening condition. Symptoms of hypoglycemia (sleepiness, dizziness, and confusion) are similar to symptoms of having too much alcohol.  If your health care provider says that alcohol is safe for you, follow these guidelines:  · Limit alcohol intake to no more than 1 drink per day for nonpregnant women and 2 drinks per day for men. One drink equals 12 oz of beer, 5 oz of wine, or 1½ oz of hard liquor.  · Do not drink on an empty stomach.  · Keep yourself hydrated with water, diet soda, or unsweetened iced tea.  · Keep in mind that regular soda, juice, and other mixers may contain a lot of sugar and must be counted as carbs.  What are tips for following this plan?    Reading food labels  · Start by checking the serving size on the "Nutrition Facts" label of packaged foods and drinks. The amount of calories, carbs, fats, and other nutrients listed on the label is based on one serving of the item. Many items contain more than one serving per package.  · Check the total grams (g) of carbs in one serving. You can calculate the number of servings of carbs in one serving by dividing the total carbs by 15. For example, if a food has 30 g of total carbs, it would be equal to 2   servings of carbs.  · Check the number of grams (g) of saturated and trans fats in one serving. Choose foods that have low or no amount of these fats.  · Check the number of milligrams (mg) of salt (sodium) in one serving. Most people should limit total sodium intake to less than 2,300 mg per day.  · Always check the nutrition information of foods labeled as "low-fat" or "nonfat". These foods may be higher in added sugar or refined carbs and should be avoided.  · Talk to your dietitian to identify your daily goals for nutrients listed on the label.  Shopping  · Avoid buying canned, premade, or processed foods. These foods tend to be high in fat, sodium,  and added sugar.  · Shop around the outside edge of the grocery store. This includes fresh fruits and vegetables, bulk grains, fresh meats, and fresh dairy.  Cooking  · Use low-heat cooking methods, such as baking, instead of high-heat cooking methods like deep frying.  · Cook using healthy oils, such as olive, canola, or sunflower oil.  · Avoid cooking with butter, cream, or high-fat meats.  Meal planning  · Eat meals and snacks regularly, preferably at the same times every day. Avoid going long periods of time without eating.  · Eat foods high in fiber, such as fresh fruits, vegetables, beans, and whole grains. Talk to your dietitian about how many servings of carbs you can eat at each meal.  · Eat 4-6 ounces (oz) of lean protein each day, such as lean meat, chicken, fish, eggs, or tofu. One oz of lean protein is equal to:  ? 1 oz of meat, chicken, or fish.  ? 1 egg.  ? ¼ cup of tofu.  · Eat some foods each day that contain healthy fats, such as avocado, nuts, seeds, and fish.  Lifestyle  · Check your blood glucose regularly.  · Exercise regularly as told by your health care provider. This may include:  ? 150 minutes of moderate-intensity or vigorous-intensity exercise each week. This could be brisk walking, biking, or water aerobics.  ? Stretching and doing strength exercises, such as yoga or weightlifting, at least 2 times a week.  · Take medicines as told by your health care provider.  · Do not use any products that contain nicotine or tobacco, such as cigarettes and e-cigarettes. If you need help quitting, ask your health care provider.  · Work with a counselor or diabetes educator to identify strategies to manage stress and any emotional and social challenges.  Questions to ask a health care provider  · Do I need to meet with a diabetes educator?  · Do I need to meet with a dietitian?  · What number can I call if I have questions?  · When are the best times to check my blood glucose?  Where to find more  information:  · American Diabetes Association: diabetes.org  · Academy of Nutrition and Dietetics: www.eatright.org  · National Institute of Diabetes and Digestive and Kidney Diseases (NIH): www.niddk.nih.gov  Summary  · A healthy meal plan will help you control your blood glucose and maintain a healthy lifestyle.  · Working with a diet and nutrition specialist (dietitian) can help you make a meal plan that is best for you.  · Keep in mind that carbohydrates (carbs) and alcohol have immediate effects on your blood glucose levels. It is important to count carbs and to use alcohol carefully.  This information is not intended to   replace advice given to you by your health care provider. Make sure you discuss any questions you have with your health care provider.  Document Released: 12/16/2004 Document Revised: 10/19/2016 Document Reviewed: 04/25/2016  Elsevier Interactive Patient Education © 2019 [employer]

## 2018-03-30 LAB — CMP14+EGFR
ALT: 10 IU/L (ref 0–32)
AST: 7 IU/L (ref 0–40)
Albumin/Globulin Ratio: 1.1 — ABNORMAL LOW (ref 1.2–2.2)
Albumin: 3 g/dL — ABNORMAL LOW (ref 3.6–4.8)
Alkaline Phosphatase: 97 IU/L (ref 39–117)
BUN/Creatinine Ratio: 22 (ref 12–28)
BUN: 32 mg/dL — ABNORMAL HIGH (ref 8–27)
CO2: 24 mmol/L (ref 20–29)
Calcium: 9.4 mg/dL (ref 8.7–10.3)
Chloride: 99 mmol/L (ref 96–106)
Creatinine, Ser: 1.44 mg/dL — ABNORMAL HIGH (ref 0.57–1.00)
GFR calc Af Amer: 45 mL/min/{1.73_m2} — ABNORMAL LOW (ref >59)
GFR calc non Af Amer: 39 mL/min/{1.73_m2} — ABNORMAL LOW (ref >59)
Globulin, Total: 2.7 g/dL (ref 1.5–4.5)
Glucose: 91 mg/dL (ref 65–99)
Potassium: 4 mmol/L (ref 3.5–5.2)
Sodium: 139 mmol/L (ref 134–144)
TOTAL PROTEIN: 5.7 g/dL — AB (ref 6.0–8.5)

## 2018-03-30 LAB — TSH: TSH: 1.53 u[IU]/mL (ref 0.450–4.500)

## 2018-03-30 LAB — T4, FREE: Free T4: 1.05 ng/dL (ref 0.82–1.77)

## 2018-03-30 LAB — MICROALBUMIN / CREATININE URINE RATIO
Creatinine, Urine: 77.4 mg/dL
MICROALB/CREAT RATIO: 6060.3 mg/g{creat} — AB (ref 0.0–30.0)
Microalbumin, Urine: 4690.7 ug/mL

## 2018-03-30 LAB — HCV COMMENT:

## 2018-03-30 LAB — HEPATITIS C ANTIBODY (REFLEX): HCV Ab: <0.1 s/co ratio (ref 0.0–0.9)

## 2018-03-30 LAB — VITAMIN D 25 HYDROXY (VIT D DEFICIENCY, FRACTURES): Vit D, 25-Hydroxy: 10.1 ng/mL — ABNORMAL LOW (ref 30.0–100.0)

## 2018-04-02 ENCOUNTER — Other Ambulatory Visit: Payer: Self-pay | Admitting: Family Medicine

## 2018-04-02 DIAGNOSIS — E559 Vitamin D deficiency, unspecified: Secondary | ICD-10-CM | POA: Insufficient documentation

## 2018-04-02 HISTORY — DX: Vitamin D deficiency, unspecified: E55.9

## 2018-04-02 MED ORDER — ERGOCALCIFEROL 1.25 MG (50000 UT) PO CAPS: 50000.0000 [IU] | ORAL_CAPSULE | ORAL | 1 refills | Status: DC

## 2018-04-03 ENCOUNTER — Telehealth: Payer: Self-pay

## 2018-04-03 NOTE — Telephone Encounter (Signed)
-----   Message from [name], MD sent at 04/02/2018  5:07 PM EST ----- Labs reveal vitamin D deficiency which can explain her fatigue.  I have sent a prescription for vitamin D replacement to her pharmacy.  Her kidneys also spilling of protein which is due to uncontrolled diabetes mellitus and I will strongly encourage compliance with her insulin and a diabetic diet.

## 2018-04-03 NOTE — Telephone Encounter (Signed)
Patient was called and informed of lab results and medication being sent to pharmacy. 

## 2018-04-12 ENCOUNTER — Telehealth: Payer: Self-pay

## 2018-04-12 DIAGNOSIS — R571 Hypovolemic shock: Secondary | ICD-10-CM | POA: Diagnosis not present

## 2018-04-12 DIAGNOSIS — Z888 Allergy status to other drugs, medicaments and biological substances status: Secondary | ICD-10-CM | POA: Diagnosis not present

## 2018-04-12 DIAGNOSIS — A419 Sepsis, unspecified organism: Secondary | ICD-10-CM | POA: Diagnosis not present

## 2018-04-12 DIAGNOSIS — J1108 Influenza due to unidentified influenza virus with specified pneumonia: Secondary | ICD-10-CM | POA: Diagnosis not present

## 2018-04-12 DIAGNOSIS — J811 Chronic pulmonary edema: Secondary | ICD-10-CM | POA: Diagnosis not present

## 2018-04-12 DIAGNOSIS — Z91040 Latex allergy status: Secondary | ICD-10-CM | POA: Diagnosis not present

## 2018-04-12 DIAGNOSIS — I130 Hypertensive heart and chronic kidney disease with heart failure and stage 1 through stage 4 chronic kidney disease, or unspecified chronic kidney disease: Secondary | ICD-10-CM | POA: Diagnosis not present

## 2018-04-12 DIAGNOSIS — N183 Chronic kidney disease, stage 3 (moderate): Secondary | ICD-10-CM | POA: Diagnosis present

## 2018-04-12 DIAGNOSIS — I5021 Acute systolic (congestive) heart failure: Secondary | ICD-10-CM | POA: Diagnosis not present

## 2018-04-12 DIAGNOSIS — J449 Chronic obstructive pulmonary disease, unspecified: Secondary | ICD-10-CM | POA: Diagnosis not present

## 2018-04-12 DIAGNOSIS — J849 Interstitial pulmonary disease, unspecified: Secondary | ICD-10-CM | POA: Diagnosis not present

## 2018-04-12 DIAGNOSIS — E1122 Type 2 diabetes mellitus with diabetic chronic kidney disease: Secondary | ICD-10-CM | POA: Diagnosis present

## 2018-04-12 DIAGNOSIS — N049 Nephrotic syndrome with unspecified morphologic changes: Secondary | ICD-10-CM | POA: Diagnosis not present

## 2018-04-12 DIAGNOSIS — R590 Localized enlarged lymph nodes: Secondary | ICD-10-CM | POA: Diagnosis not present

## 2018-04-12 DIAGNOSIS — J189 Pneumonia, unspecified organism: Secondary | ICD-10-CM | POA: Diagnosis not present

## 2018-04-12 DIAGNOSIS — Z79899 Other long term (current) drug therapy: Secondary | ICD-10-CM | POA: Diagnosis not present

## 2018-04-12 DIAGNOSIS — Z885 Allergy status to narcotic agent status: Secondary | ICD-10-CM | POA: Diagnosis not present

## 2018-04-12 DIAGNOSIS — Z452 Encounter for adjustment and management of vascular access device: Secondary | ICD-10-CM | POA: Diagnosis not present

## 2018-04-12 DIAGNOSIS — M109 Gout, unspecified: Secondary | ICD-10-CM | POA: Diagnosis present

## 2018-04-12 DIAGNOSIS — E871 Hypo-osmolality and hyponatremia: Secondary | ICD-10-CM | POA: Diagnosis present

## 2018-04-12 DIAGNOSIS — R918 Other nonspecific abnormal finding of lung field: Secondary | ICD-10-CM | POA: Diagnosis not present

## 2018-04-12 DIAGNOSIS — E440 Moderate protein-calorie malnutrition: Secondary | ICD-10-CM | POA: Diagnosis not present

## 2018-04-12 DIAGNOSIS — Z9049 Acquired absence of other specified parts of digestive tract: Secondary | ICD-10-CM | POA: Diagnosis not present

## 2018-04-12 DIAGNOSIS — I509 Heart failure, unspecified: Secondary | ICD-10-CM | POA: Diagnosis not present

## 2018-04-12 DIAGNOSIS — N179 Acute kidney failure, unspecified: Secondary | ICD-10-CM | POA: Diagnosis not present

## 2018-04-12 DIAGNOSIS — Z9889 Other specified postprocedural states: Secondary | ICD-10-CM | POA: Diagnosis not present

## 2018-04-12 DIAGNOSIS — B961 Klebsiella pneumoniae [K. pneumoniae] as the cause of diseases classified elsewhere: Secondary | ICD-10-CM | POA: Diagnosis not present

## 2018-04-12 DIAGNOSIS — E1121 Type 2 diabetes mellitus with diabetic nephropathy: Secondary | ICD-10-CM | POA: Diagnosis present

## 2018-04-12 DIAGNOSIS — R600 Localized edema: Secondary | ICD-10-CM | POA: Diagnosis not present

## 2018-04-12 DIAGNOSIS — R6510 Systemic inflammatory response syndrome (SIRS) of non-infectious origin without acute organ dysfunction: Secondary | ICD-10-CM | POA: Diagnosis not present

## 2018-04-12 DIAGNOSIS — J9601 Acute respiratory failure with hypoxia: Secondary | ICD-10-CM | POA: Diagnosis not present

## 2018-04-12 DIAGNOSIS — J9811 Atelectasis: Secondary | ICD-10-CM | POA: Diagnosis present

## 2018-04-12 DIAGNOSIS — E1165 Type 2 diabetes mellitus with hyperglycemia: Secondary | ICD-10-CM | POA: Diagnosis present

## 2018-04-12 DIAGNOSIS — E785 Hyperlipidemia, unspecified: Secondary | ICD-10-CM | POA: Diagnosis present

## 2018-04-12 DIAGNOSIS — Z794 Long term (current) use of insulin: Secondary | ICD-10-CM | POA: Diagnosis not present

## 2018-04-12 DIAGNOSIS — Z7982 Long term (current) use of aspirin: Secondary | ICD-10-CM | POA: Diagnosis not present

## 2018-04-12 DIAGNOSIS — R140 Abdominal distension (gaseous): Secondary | ICD-10-CM | POA: Diagnosis not present

## 2018-04-12 DIAGNOSIS — J9600 Acute respiratory failure, unspecified whether with hypoxia or hypercapnia: Secondary | ICD-10-CM | POA: Diagnosis not present

## 2018-04-12 DIAGNOSIS — R042 Hemoptysis: Secondary | ICD-10-CM | POA: Diagnosis not present

## 2018-04-12 DIAGNOSIS — Z91018 Allergy to other foods: Secondary | ICD-10-CM | POA: Diagnosis not present

## 2018-04-12 DIAGNOSIS — R0600 Dyspnea, unspecified: Secondary | ICD-10-CM | POA: Diagnosis not present

## 2018-04-12 DIAGNOSIS — I5033 Acute on chronic diastolic (congestive) heart failure: Secondary | ICD-10-CM | POA: Diagnosis present

## 2018-04-12 DIAGNOSIS — D631 Anemia in chronic kidney disease: Secondary | ICD-10-CM | POA: Diagnosis present

## 2018-04-12 DIAGNOSIS — M1612 Unilateral primary osteoarthritis, left hip: Secondary | ICD-10-CM | POA: Diagnosis present

## 2018-04-12 DIAGNOSIS — R0602 Shortness of breath: Secondary | ICD-10-CM | POA: Diagnosis not present

## 2018-04-12 DIAGNOSIS — R251 Tremor, unspecified: Secondary | ICD-10-CM | POA: Diagnosis not present

## 2018-04-12 DIAGNOSIS — Z5181 Encounter for therapeutic drug level monitoring: Secondary | ICD-10-CM | POA: Diagnosis not present

## 2018-04-12 DIAGNOSIS — J09X2 Influenza due to identified novel influenza A virus with other respiratory manifestations: Secondary | ICD-10-CM | POA: Diagnosis not present

## 2018-04-12 DIAGNOSIS — E876 Hypokalemia: Secondary | ICD-10-CM | POA: Diagnosis present

## 2018-04-12 DIAGNOSIS — I5020 Unspecified systolic (congestive) heart failure: Secondary | ICD-10-CM | POA: Diagnosis not present

## 2018-04-12 NOTE — Telephone Encounter (Signed)
Call placed to check on patient # [id number] and discuss scheduling a follow up appointment. Voicemail message left requesting a call back to this CM # [phone]

## 2018-04-25 ENCOUNTER — Ambulatory Visit: Payer: Medicare HMO | Admitting: Nurse Practitioner

## 2018-04-25 NOTE — Progress Notes (Deleted)
CARDIOLOGY OFFICE NOTE  Date:  04/25/2018    Kara Hanson Date of Birth: 1955/04/23 Medical Record #[id number]  PCP:  [name], MD  Cardiologist:  Servando Snare & ***    No chief complaint on file.   History of Present Illness: Kara Hanson is a 63 y.o. female who presents today for a *** saw originally in the hospital in consultation on 04/23/16 who underwent cardiac catheterization because of newly discovered ejection fraction of 30-35%, started on carvedilol, brief episode of syncope at home with T wave inversion V2-3 whose catheterization showed normal coronary arteries and normal ejection fraction EF 50% with diabetes, hypertension, hyperlipidemia with no prior cardiac history admitted with sudden loss of consciousness witnessed at around midnight while watching television with her daughter. Perhaps this was stress-induced cardiomyopathy. Post catheterization she felt strange and had a head CT that showed small vessel changes neuro nonfocal. After D50 she was normal. She went home.  Her chief complaint/history of present illness when going into the hospital in late January was as follows:    She felt dizziness all day long she states. This lasted a few seconds no seizure-like activity. She developed sharp chest discomfort following this episode with no radiation that improved after a few minutes. She did receive dilauded to help with chest discomfort. Once in the emergency department, she continued to have some chest tightness as well as shortness of breath. Given her recent travel history to [place], a CT scan of her chest was performed which demonstrated no evidence of pulmonary embolism. This did however show aortic atherosclerosis as well as coronary atherosclerosis in the form of calcifications.  Approximately 3 years ago she had cardiac catheterization in [place] with no stents placed per patient.  Her EKG did demonstrate T wave inversion inferior and  laterally new.This sometimes is classic for stress induced cardiomyopathy.  Nonsmoker, no alcohol, no drug use  Here in the clinic, she is not having any chest pain, sometimes she will wake up short of breath at night with this may be anxiety. Her left ventricular end-diastolic pressure was normal on cardiac catheterization. Her ejection fraction was normal on cardiac catheterization. She clearly is having vertigo. When walking, she is holding onto the wall at times.  She was placed on furosemide however now she feels quite dry. Once again she did not have elevated filling pressures and her heart and her pump function was normal. We have decided to discontinue. Her daughter has been giving her more water to try to combat dryness.   Comes in today. Here with   Past Medical History:  Diagnosis Date  . CHF (congestive heart failure) ([place])   . CVA (cerebral vascular accident) ([place])   . Diabetes mellitus without complication ([place])   . Hypercholesteremia   . Hypertension   . Myocardial infarction ([place])   . Spinal stenosis     Past Surgical History:  Procedure Laterality Date  . BLADDER SURGERY    . CARDIAC CATHETERIZATION N/A 04/25/2016   Procedure: Left Heart Cath and Coronary Angiography;  Surgeon: Lorretta Harp, MD;  Location: [facility];  Service: Cardiovascular;  Laterality: N/A;  . CESAREAN SECTION    . CHOLECYSTECTOMY       Medications: No outpatient medications have been marked as taking for the 04/25/18 encounter (Appointment) with Burtis Junes, NP.     Allergies: Allergies  Allergen Reactions  . Garlic Shortness Of Breath, Itching and Swelling    Hand itching  and swelling  . Latex Itching  . Morphine And Related Itching and Other (See Comments)    Headache   . Other Itching    Reaction to newspaper ink -itching and headache    Social History: The patient  reports that she has never smoked. She has never used smokeless tobacco. She reports that  she does not drink alcohol or use drugs.   Family History: The patient's ***family history includes Diabetes Mellitus II in her father; Stroke in her father.   Review of Systems: Please see the history of present illness.   Otherwise, the review of systems is positive for {NONE DEFAULTED:18576::"none"}.   All other systems are reviewed and negative.   Physical Exam: VS:  There were no vitals taken for this visit. Marland Kitchen  BMI There is no height or weight on file to calculate BMI.  Wt Readings from Last 3 Encounters:  03/29/18 173 lb 12.8 oz (78.8 kg)  03/24/18 170 lb 6.7 oz (77.3 kg)  01/02/18 177 lb 6.4 oz (80.5 kg)    General: Pleasant. Well developed, well nourished and in no acute distress.   HEENT: Normal.  Neck: Supple, no JVD, carotid bruits, or masses noted.  Cardiac: ***Regular rate and rhythm. No murmurs, rubs, or gallops. No edema.  Respiratory:  Lungs are clear to auscultation bilaterally with normal work of breathing.  GI: Soft and nontender.  MS: No deformity or atrophy. Gait and ROM intact.  Skin: Warm and dry. Color is normal.  Neuro:  Strength and sensation are intact and no gross focal deficits noted.  Psych: Alert, appropriate and with normal affect.   LABORATORY DATA:  EKG:  EKG {ACTION; IS/IS XLK:[id number]} ordered today. This demonstrates ***.  Lab Results  Component Value Date   WBC 4.6 03/24/2018   HGB 10.6 (L) 03/24/2018   HCT 34.1 (L) 03/24/2018   PLT 260 03/24/2018   GLUCOSE 91 03/29/2018   CHOL 295 (H) 03/24/2018   TRIG 174 (H) 03/24/2018   HDL 45 03/24/2018   LDLCALC 215 (H) 03/24/2018   ALT 10 03/29/2018   AST 7 03/29/2018   NA 139 03/29/2018   K 4.0 03/29/2018   CL 99 03/29/2018   CREATININE 1.44 (H) 03/29/2018   BUN 32 (H) 03/29/2018   CO2 24 03/29/2018   TSH 1.530 03/29/2018   INR 1.04 04/25/2016   HGBA1C 11.8 (A) 03/29/2018     BNP (last 3 results) Recent Labs    11/22/17 2330 11/27/17 0111 03/20/18 0054  BNP 128.2* 59.5  218.7*    ProBNP (last 3 results) No results for input(s): PROBNP in the last 8760 hours.   Other Studies Reviewed Today:   Assessment/Plan: Syncope/Transient reduction in ejection fraction  - Unsure what the inciting event was. Perhaps it was similar to what she was experiencing in the hospital when her glucose was slightly low and she felt better after D50.  - Cardiac catheterization showed normal coronary arteries reassuringly. Her EF also was 50%.  - Her original echocardiogram showed an ejection fraction of 35%, I wonder if the resolution of her ejection fraction was secondary to stress induced cardiomyopathy. Her husband has been quite sick and hospitalized with amputation of a toe, peripheral vascular disease. Both she and her daughter are under increased stress.  - I think continuing her carvedilol makes sense.Thankfully, her ejection fraction is now normal. She does NOT have systolic dysfunction or chronic systolic heart failure  I'm unsure why she is dizzy-sounds vertiginous, could consider ENT if  necessary.  Since her left ventricular end-diastolic pressure is normal and clinically she is feeling "dry" we will discontinue her furosemide.  Essential hypertension  - Since her blood pressure is under much better control with the carvedilol and losartan, I would recommend that she continue with these medications.  Please let me know if I can be of further assistance. We will see her back as needed.   Current medicines are reviewed with the patient today.  The patient does not have concerns regarding medicines other than what has been noted above.  The following changes have been made:  See above.  Labs/ tests ordered today include:   No orders of the defined types were placed in this encounter.    Disposition:   FU with *** in {gen number 3-73:578978} {Days to years:10300}.   Patient is agreeable to this plan and will call if any problems develop in the interim.    SignedTruitt Merle, NP  04/25/2018 7:27 AM  [facility] [address] [place], [place]  [address] Phone: [phone] Fax: [phone]

## 2018-04-26 ENCOUNTER — Encounter: Payer: Self-pay | Admitting: Nurse Practitioner

## 2018-04-27 ENCOUNTER — Telehealth: Payer: Self-pay

## 2018-04-27 NOTE — Telephone Encounter (Signed)
Call placed to the patient to check on her and to schedule a follow up appointment with Dr Margarita Rana.  The patient stated that she is in the hospital in [place].  She explained that she had the flu , was in ICU and has been hospitalized for 3-4 weeks. She said that she can barely walk and will need to stay in the hospital until she is stronger because her insurance will not pay for inpatient rehab. She went to [place] to visit family.

## 2018-04-30 NOTE — Telephone Encounter (Signed)
Noted  

## 2018-05-17 DIAGNOSIS — J449 Chronic obstructive pulmonary disease, unspecified: Secondary | ICD-10-CM | POA: Diagnosis not present

## 2018-05-17 DIAGNOSIS — B961 Klebsiella pneumoniae [K. pneumoniae] as the cause of diseases classified elsewhere: Secondary | ICD-10-CM | POA: Diagnosis not present

## 2018-05-17 DIAGNOSIS — R571 Hypovolemic shock: Secondary | ICD-10-CM | POA: Diagnosis not present

## 2018-05-17 DIAGNOSIS — J111 Influenza due to unidentified influenza virus with other respiratory manifestations: Secondary | ICD-10-CM | POA: Diagnosis not present

## 2018-05-17 DIAGNOSIS — E118 Type 2 diabetes mellitus with unspecified complications: Secondary | ICD-10-CM | POA: Diagnosis present

## 2018-05-17 DIAGNOSIS — B373 Candidiasis of vulva and vagina: Secondary | ICD-10-CM | POA: Diagnosis present

## 2018-05-17 DIAGNOSIS — N390 Urinary tract infection, site not specified: Secondary | ICD-10-CM | POA: Diagnosis not present

## 2018-05-17 DIAGNOSIS — J441 Chronic obstructive pulmonary disease with (acute) exacerbation: Secondary | ICD-10-CM | POA: Diagnosis present

## 2018-05-17 DIAGNOSIS — R030 Elevated blood-pressure reading, without diagnosis of hypertension: Secondary | ICD-10-CM | POA: Diagnosis present

## 2018-05-17 DIAGNOSIS — Z452 Encounter for adjustment and management of vascular access device: Secondary | ICD-10-CM | POA: Diagnosis not present

## 2018-05-17 DIAGNOSIS — R262 Difficulty in walking, not elsewhere classified: Secondary | ICD-10-CM | POA: Diagnosis not present

## 2018-05-17 DIAGNOSIS — N189 Chronic kidney disease, unspecified: Secondary | ICD-10-CM | POA: Diagnosis present

## 2018-05-17 DIAGNOSIS — R0600 Dyspnea, unspecified: Secondary | ICD-10-CM | POA: Diagnosis not present

## 2018-05-17 DIAGNOSIS — J811 Chronic pulmonary edema: Secondary | ICD-10-CM | POA: Diagnosis not present

## 2018-05-17 DIAGNOSIS — I2510 Atherosclerotic heart disease of native coronary artery without angina pectoris: Secondary | ICD-10-CM | POA: Diagnosis not present

## 2018-05-17 DIAGNOSIS — Z0189 Encounter for other specified special examinations: Secondary | ICD-10-CM | POA: Diagnosis not present

## 2018-05-17 DIAGNOSIS — J189 Pneumonia, unspecified organism: Secondary | ICD-10-CM | POA: Diagnosis not present

## 2018-05-17 DIAGNOSIS — J9690 Respiratory failure, unspecified, unspecified whether with hypoxia or hypercapnia: Secondary | ICD-10-CM | POA: Diagnosis not present

## 2018-05-17 DIAGNOSIS — I10 Essential (primary) hypertension: Secondary | ICD-10-CM | POA: Diagnosis not present

## 2018-05-17 DIAGNOSIS — R69 Illness, unspecified: Secondary | ICD-10-CM | POA: Diagnosis not present

## 2018-05-17 DIAGNOSIS — I5022 Chronic systolic (congestive) heart failure: Secondary | ICD-10-CM | POA: Diagnosis present

## 2018-05-17 DIAGNOSIS — E876 Hypokalemia: Secondary | ICD-10-CM | POA: Diagnosis not present

## 2018-05-17 DIAGNOSIS — J9600 Acute respiratory failure, unspecified whether with hypoxia or hypercapnia: Secondary | ICD-10-CM | POA: Diagnosis present

## 2018-05-17 DIAGNOSIS — M6281 Muscle weakness (generalized): Secondary | ICD-10-CM | POA: Diagnosis not present

## 2018-05-17 DIAGNOSIS — E119 Type 2 diabetes mellitus without complications: Secondary | ICD-10-CM | POA: Diagnosis not present

## 2018-05-17 DIAGNOSIS — R042 Hemoptysis: Secondary | ICD-10-CM | POA: Diagnosis not present

## 2018-05-17 DIAGNOSIS — I429 Cardiomyopathy, unspecified: Secondary | ICD-10-CM | POA: Diagnosis present

## 2018-05-17 DIAGNOSIS — N179 Acute kidney failure, unspecified: Secondary | ICD-10-CM | POA: Diagnosis present

## 2018-05-17 DIAGNOSIS — E785 Hyperlipidemia, unspecified: Secondary | ICD-10-CM | POA: Diagnosis not present

## 2018-05-17 DIAGNOSIS — J90 Pleural effusion, not elsewhere classified: Secondary | ICD-10-CM | POA: Diagnosis not present

## 2018-05-17 DIAGNOSIS — M79604 Pain in right leg: Secondary | ICD-10-CM | POA: Diagnosis not present

## 2018-05-17 DIAGNOSIS — D649 Anemia, unspecified: Secondary | ICD-10-CM | POA: Diagnosis present

## 2018-05-17 DIAGNOSIS — Z743 Need for continuous supervision: Secondary | ICD-10-CM | POA: Diagnosis not present

## 2018-05-17 DIAGNOSIS — I509 Heart failure, unspecified: Secondary | ICD-10-CM | POA: Diagnosis not present

## 2018-05-17 DIAGNOSIS — N19 Unspecified kidney failure: Secondary | ICD-10-CM | POA: Diagnosis not present

## 2018-05-17 DIAGNOSIS — I5021 Acute systolic (congestive) heart failure: Secondary | ICD-10-CM | POA: Diagnosis not present

## 2018-05-17 DIAGNOSIS — I129 Hypertensive chronic kidney disease with stage 1 through stage 4 chronic kidney disease, or unspecified chronic kidney disease: Secondary | ICD-10-CM | POA: Diagnosis present

## 2018-05-17 DIAGNOSIS — R600 Localized edema: Secondary | ICD-10-CM | POA: Diagnosis not present

## 2018-05-17 DIAGNOSIS — J9811 Atelectasis: Secondary | ICD-10-CM | POA: Diagnosis present

## 2018-05-17 DIAGNOSIS — M79605 Pain in left leg: Secondary | ICD-10-CM | POA: Diagnosis not present

## 2018-05-17 DIAGNOSIS — M542 Cervicalgia: Secondary | ICD-10-CM | POA: Diagnosis not present

## 2018-05-17 DIAGNOSIS — I5020 Unspecified systolic (congestive) heart failure: Secondary | ICD-10-CM | POA: Diagnosis not present

## 2018-05-17 DIAGNOSIS — K219 Gastro-esophageal reflux disease without esophagitis: Secondary | ICD-10-CM | POA: Diagnosis not present

## 2018-05-17 DIAGNOSIS — R6510 Systemic inflammatory response syndrome (SIRS) of non-infectious origin without acute organ dysfunction: Secondary | ICD-10-CM | POA: Diagnosis not present

## 2018-05-17 DIAGNOSIS — J09X2 Influenza due to identified novel influenza A virus with other respiratory manifestations: Secondary | ICD-10-CM | POA: Diagnosis not present

## 2018-05-17 DIAGNOSIS — N049 Nephrotic syndrome with unspecified morphologic changes: Secondary | ICD-10-CM | POA: Diagnosis not present

## 2018-05-17 DIAGNOSIS — E1122 Type 2 diabetes mellitus with diabetic chronic kidney disease: Secondary | ICD-10-CM | POA: Diagnosis present

## 2018-05-17 DIAGNOSIS — R251 Tremor, unspecified: Secondary | ICD-10-CM | POA: Diagnosis not present

## 2018-05-17 DIAGNOSIS — M109 Gout, unspecified: Secondary | ICD-10-CM | POA: Diagnosis not present

## 2018-05-17 DIAGNOSIS — A419 Sepsis, unspecified organism: Secondary | ICD-10-CM | POA: Diagnosis not present

## 2018-05-17 DIAGNOSIS — G629 Polyneuropathy, unspecified: Secondary | ICD-10-CM | POA: Diagnosis not present

## 2018-06-11 DIAGNOSIS — R69 Illness, unspecified: Secondary | ICD-10-CM | POA: Diagnosis not present

## 2018-06-11 DIAGNOSIS — I129 Hypertensive chronic kidney disease with stage 1 through stage 4 chronic kidney disease, or unspecified chronic kidney disease: Secondary | ICD-10-CM | POA: Diagnosis not present

## 2018-06-11 DIAGNOSIS — L89322 Pressure ulcer of left buttock, stage 2: Secondary | ICD-10-CM | POA: Diagnosis not present

## 2018-06-11 DIAGNOSIS — E785 Hyperlipidemia, unspecified: Secondary | ICD-10-CM | POA: Diagnosis not present

## 2018-06-11 DIAGNOSIS — M6281 Muscle weakness (generalized): Secondary | ICD-10-CM | POA: Diagnosis not present

## 2018-06-11 DIAGNOSIS — N390 Urinary tract infection, site not specified: Secondary | ICD-10-CM | POA: Diagnosis not present

## 2018-06-11 DIAGNOSIS — G629 Polyneuropathy, unspecified: Secondary | ICD-10-CM | POA: Diagnosis not present

## 2018-06-11 DIAGNOSIS — I10 Essential (primary) hypertension: Secondary | ICD-10-CM | POA: Diagnosis not present

## 2018-06-11 DIAGNOSIS — N179 Acute kidney failure, unspecified: Secondary | ICD-10-CM | POA: Diagnosis not present

## 2018-06-11 DIAGNOSIS — Z743 Need for continuous supervision: Secondary | ICD-10-CM | POA: Diagnosis not present

## 2018-06-11 DIAGNOSIS — R509 Fever, unspecified: Secondary | ICD-10-CM | POA: Diagnosis not present

## 2018-06-11 DIAGNOSIS — J9600 Acute respiratory failure, unspecified whether with hypoxia or hypercapnia: Secondary | ICD-10-CM | POA: Diagnosis not present

## 2018-06-11 DIAGNOSIS — Z6832 Body mass index (BMI) 32.0-32.9, adult: Secondary | ICD-10-CM | POA: Diagnosis not present

## 2018-06-11 DIAGNOSIS — I110 Hypertensive heart disease with heart failure: Secondary | ICD-10-CM | POA: Diagnosis not present

## 2018-06-11 DIAGNOSIS — E119 Type 2 diabetes mellitus without complications: Secondary | ICD-10-CM | POA: Diagnosis not present

## 2018-06-11 DIAGNOSIS — J449 Chronic obstructive pulmonary disease, unspecified: Secondary | ICD-10-CM | POA: Diagnosis not present

## 2018-06-11 DIAGNOSIS — N189 Chronic kidney disease, unspecified: Secondary | ICD-10-CM | POA: Diagnosis not present

## 2018-06-11 DIAGNOSIS — D649 Anemia, unspecified: Secondary | ICD-10-CM | POA: Diagnosis not present

## 2018-06-11 DIAGNOSIS — E11622 Type 2 diabetes mellitus with other skin ulcer: Secondary | ICD-10-CM | POA: Diagnosis not present

## 2018-06-11 DIAGNOSIS — K219 Gastro-esophageal reflux disease without esophagitis: Secondary | ICD-10-CM | POA: Diagnosis not present

## 2018-06-11 DIAGNOSIS — R531 Weakness: Secondary | ICD-10-CM | POA: Diagnosis not present

## 2018-06-11 DIAGNOSIS — E669 Obesity, unspecified: Secondary | ICD-10-CM | POA: Diagnosis not present

## 2018-06-11 DIAGNOSIS — N19 Unspecified kidney failure: Secondary | ICD-10-CM | POA: Diagnosis not present

## 2018-06-11 DIAGNOSIS — Z736 Limitation of activities due to disability: Secondary | ICD-10-CM | POA: Diagnosis not present

## 2018-06-11 DIAGNOSIS — J189 Pneumonia, unspecified organism: Secondary | ICD-10-CM | POA: Diagnosis not present

## 2018-06-11 DIAGNOSIS — N3946 Mixed incontinence: Secondary | ICD-10-CM | POA: Diagnosis not present

## 2018-06-11 DIAGNOSIS — E118 Type 2 diabetes mellitus with unspecified complications: Secondary | ICD-10-CM | POA: Diagnosis not present

## 2018-06-11 DIAGNOSIS — R251 Tremor, unspecified: Secondary | ICD-10-CM | POA: Diagnosis not present

## 2018-06-11 DIAGNOSIS — J9690 Respiratory failure, unspecified, unspecified whether with hypoxia or hypercapnia: Secondary | ICD-10-CM | POA: Diagnosis not present

## 2018-06-11 DIAGNOSIS — Z452 Encounter for adjustment and management of vascular access device: Secondary | ICD-10-CM | POA: Diagnosis not present

## 2018-06-11 DIAGNOSIS — I509 Heart failure, unspecified: Secondary | ICD-10-CM | POA: Diagnosis not present

## 2018-06-11 DIAGNOSIS — R262 Difficulty in walking, not elsewhere classified: Secondary | ICD-10-CM | POA: Diagnosis not present

## 2018-06-11 DIAGNOSIS — M109 Gout, unspecified: Secondary | ICD-10-CM | POA: Diagnosis not present

## 2018-06-11 DIAGNOSIS — R6510 Systemic inflammatory response syndrome (SIRS) of non-infectious origin without acute organ dysfunction: Secondary | ICD-10-CM | POA: Diagnosis not present

## 2018-06-11 DIAGNOSIS — R0600 Dyspnea, unspecified: Secondary | ICD-10-CM | POA: Diagnosis not present

## 2018-06-11 DIAGNOSIS — I429 Cardiomyopathy, unspecified: Secondary | ICD-10-CM | POA: Diagnosis not present

## 2018-06-11 DIAGNOSIS — E1122 Type 2 diabetes mellitus with diabetic chronic kidney disease: Secondary | ICD-10-CM | POA: Diagnosis not present

## 2018-06-12 DIAGNOSIS — M109 Gout, unspecified: Secondary | ICD-10-CM | POA: Diagnosis not present

## 2018-06-12 DIAGNOSIS — I429 Cardiomyopathy, unspecified: Secondary | ICD-10-CM | POA: Diagnosis not present

## 2018-06-12 DIAGNOSIS — Z736 Limitation of activities due to disability: Secondary | ICD-10-CM | POA: Diagnosis not present

## 2018-06-12 DIAGNOSIS — R262 Difficulty in walking, not elsewhere classified: Secondary | ICD-10-CM | POA: Diagnosis not present

## 2018-06-12 DIAGNOSIS — J9690 Respiratory failure, unspecified, unspecified whether with hypoxia or hypercapnia: Secondary | ICD-10-CM | POA: Diagnosis not present

## 2018-06-12 DIAGNOSIS — N189 Chronic kidney disease, unspecified: Secondary | ICD-10-CM | POA: Diagnosis not present

## 2018-06-12 DIAGNOSIS — E785 Hyperlipidemia, unspecified: Secondary | ICD-10-CM | POA: Diagnosis not present

## 2018-06-12 DIAGNOSIS — K219 Gastro-esophageal reflux disease without esophagitis: Secondary | ICD-10-CM | POA: Diagnosis not present

## 2018-06-12 DIAGNOSIS — E119 Type 2 diabetes mellitus without complications: Secondary | ICD-10-CM | POA: Diagnosis not present

## 2018-06-12 DIAGNOSIS — I509 Heart failure, unspecified: Secondary | ICD-10-CM | POA: Diagnosis not present

## 2018-06-13 DIAGNOSIS — N3946 Mixed incontinence: Secondary | ICD-10-CM | POA: Diagnosis not present

## 2018-06-13 DIAGNOSIS — N390 Urinary tract infection, site not specified: Secondary | ICD-10-CM | POA: Diagnosis not present

## 2018-06-13 DIAGNOSIS — E11622 Type 2 diabetes mellitus with other skin ulcer: Secondary | ICD-10-CM | POA: Diagnosis not present

## 2018-06-13 DIAGNOSIS — N179 Acute kidney failure, unspecified: Secondary | ICD-10-CM | POA: Diagnosis not present

## 2018-06-13 DIAGNOSIS — G629 Polyneuropathy, unspecified: Secondary | ICD-10-CM | POA: Diagnosis not present

## 2018-06-13 DIAGNOSIS — L89322 Pressure ulcer of left buttock, stage 2: Secondary | ICD-10-CM | POA: Diagnosis not present

## 2018-06-13 DIAGNOSIS — R531 Weakness: Secondary | ICD-10-CM | POA: Diagnosis not present

## 2018-06-13 DIAGNOSIS — J189 Pneumonia, unspecified organism: Secondary | ICD-10-CM | POA: Diagnosis not present

## 2018-06-14 DIAGNOSIS — R262 Difficulty in walking, not elsewhere classified: Secondary | ICD-10-CM | POA: Diagnosis not present

## 2018-06-14 DIAGNOSIS — Z736 Limitation of activities due to disability: Secondary | ICD-10-CM | POA: Diagnosis not present

## 2018-06-18 DIAGNOSIS — Z736 Limitation of activities due to disability: Secondary | ICD-10-CM | POA: Diagnosis not present

## 2018-06-18 DIAGNOSIS — R262 Difficulty in walking, not elsewhere classified: Secondary | ICD-10-CM | POA: Diagnosis not present

## 2018-06-19 DIAGNOSIS — I509 Heart failure, unspecified: Secondary | ICD-10-CM | POA: Diagnosis not present

## 2018-06-19 DIAGNOSIS — I429 Cardiomyopathy, unspecified: Secondary | ICD-10-CM | POA: Diagnosis not present

## 2018-06-19 DIAGNOSIS — J9690 Respiratory failure, unspecified, unspecified whether with hypoxia or hypercapnia: Secondary | ICD-10-CM | POA: Diagnosis not present

## 2018-06-19 DIAGNOSIS — M109 Gout, unspecified: Secondary | ICD-10-CM | POA: Diagnosis not present

## 2018-06-19 DIAGNOSIS — E119 Type 2 diabetes mellitus without complications: Secondary | ICD-10-CM | POA: Diagnosis not present

## 2018-06-19 DIAGNOSIS — N189 Chronic kidney disease, unspecified: Secondary | ICD-10-CM | POA: Diagnosis not present

## 2018-06-19 DIAGNOSIS — E785 Hyperlipidemia, unspecified: Secondary | ICD-10-CM | POA: Diagnosis not present

## 2018-06-19 DIAGNOSIS — K219 Gastro-esophageal reflux disease without esophagitis: Secondary | ICD-10-CM | POA: Diagnosis not present

## 2018-06-19 DIAGNOSIS — I10 Essential (primary) hypertension: Secondary | ICD-10-CM | POA: Diagnosis not present

## 2018-06-20 DIAGNOSIS — E11622 Type 2 diabetes mellitus with other skin ulcer: Secondary | ICD-10-CM | POA: Diagnosis not present

## 2018-06-20 DIAGNOSIS — G629 Polyneuropathy, unspecified: Secondary | ICD-10-CM | POA: Diagnosis not present

## 2018-06-20 DIAGNOSIS — N3946 Mixed incontinence: Secondary | ICD-10-CM | POA: Diagnosis not present

## 2018-06-20 DIAGNOSIS — N390 Urinary tract infection, site not specified: Secondary | ICD-10-CM | POA: Diagnosis not present

## 2018-06-20 DIAGNOSIS — R531 Weakness: Secondary | ICD-10-CM | POA: Diagnosis not present

## 2018-06-20 DIAGNOSIS — L89322 Pressure ulcer of left buttock, stage 2: Secondary | ICD-10-CM | POA: Diagnosis not present

## 2018-06-20 DIAGNOSIS — J189 Pneumonia, unspecified organism: Secondary | ICD-10-CM | POA: Diagnosis not present

## 2018-06-20 DIAGNOSIS — N179 Acute kidney failure, unspecified: Secondary | ICD-10-CM | POA: Diagnosis not present

## 2018-06-21 DIAGNOSIS — Z736 Limitation of activities due to disability: Secondary | ICD-10-CM | POA: Diagnosis not present

## 2018-06-21 DIAGNOSIS — R262 Difficulty in walking, not elsewhere classified: Secondary | ICD-10-CM | POA: Diagnosis not present

## 2018-06-25 DIAGNOSIS — R262 Difficulty in walking, not elsewhere classified: Secondary | ICD-10-CM | POA: Diagnosis not present

## 2018-06-25 DIAGNOSIS — Z736 Limitation of activities due to disability: Secondary | ICD-10-CM | POA: Diagnosis not present

## 2018-06-26 DIAGNOSIS — I110 Hypertensive heart disease with heart failure: Secondary | ICD-10-CM | POA: Diagnosis not present

## 2018-06-26 DIAGNOSIS — E785 Hyperlipidemia, unspecified: Secondary | ICD-10-CM | POA: Diagnosis not present

## 2018-06-26 DIAGNOSIS — R0600 Dyspnea, unspecified: Secondary | ICD-10-CM | POA: Diagnosis not present

## 2018-06-26 DIAGNOSIS — E669 Obesity, unspecified: Secondary | ICD-10-CM | POA: Diagnosis not present

## 2018-06-26 DIAGNOSIS — Z6832 Body mass index (BMI) 32.0-32.9, adult: Secondary | ICD-10-CM | POA: Diagnosis not present

## 2018-06-27 DIAGNOSIS — R531 Weakness: Secondary | ICD-10-CM | POA: Diagnosis not present

## 2018-06-27 DIAGNOSIS — L89322 Pressure ulcer of left buttock, stage 2: Secondary | ICD-10-CM | POA: Diagnosis not present

## 2018-06-27 DIAGNOSIS — E11622 Type 2 diabetes mellitus with other skin ulcer: Secondary | ICD-10-CM | POA: Diagnosis not present

## 2018-06-27 DIAGNOSIS — Z736 Limitation of activities due to disability: Secondary | ICD-10-CM | POA: Diagnosis not present

## 2018-06-27 DIAGNOSIS — N3946 Mixed incontinence: Secondary | ICD-10-CM | POA: Diagnosis not present

## 2018-06-27 DIAGNOSIS — R262 Difficulty in walking, not elsewhere classified: Secondary | ICD-10-CM | POA: Diagnosis not present

## 2018-06-28 DIAGNOSIS — N390 Urinary tract infection, site not specified: Secondary | ICD-10-CM | POA: Diagnosis not present

## 2018-06-28 DIAGNOSIS — G629 Polyneuropathy, unspecified: Secondary | ICD-10-CM | POA: Diagnosis not present

## 2018-06-28 DIAGNOSIS — N179 Acute kidney failure, unspecified: Secondary | ICD-10-CM | POA: Diagnosis not present

## 2018-06-28 DIAGNOSIS — J189 Pneumonia, unspecified organism: Secondary | ICD-10-CM | POA: Diagnosis not present

## 2018-07-04 DIAGNOSIS — Z736 Limitation of activities due to disability: Secondary | ICD-10-CM | POA: Diagnosis not present

## 2018-07-04 DIAGNOSIS — L89322 Pressure ulcer of left buttock, stage 2: Secondary | ICD-10-CM | POA: Diagnosis not present

## 2018-07-04 DIAGNOSIS — R531 Weakness: Secondary | ICD-10-CM | POA: Diagnosis not present

## 2018-07-04 DIAGNOSIS — R262 Difficulty in walking, not elsewhere classified: Secondary | ICD-10-CM | POA: Diagnosis not present

## 2018-07-04 DIAGNOSIS — E11622 Type 2 diabetes mellitus with other skin ulcer: Secondary | ICD-10-CM | POA: Diagnosis not present

## 2018-07-04 DIAGNOSIS — N3946 Mixed incontinence: Secondary | ICD-10-CM | POA: Diagnosis not present

## 2018-07-06 DIAGNOSIS — N390 Urinary tract infection, site not specified: Secondary | ICD-10-CM | POA: Diagnosis not present

## 2018-07-06 DIAGNOSIS — G629 Polyneuropathy, unspecified: Secondary | ICD-10-CM | POA: Diagnosis not present

## 2018-07-06 DIAGNOSIS — N179 Acute kidney failure, unspecified: Secondary | ICD-10-CM | POA: Diagnosis not present

## 2018-07-06 DIAGNOSIS — J189 Pneumonia, unspecified organism: Secondary | ICD-10-CM | POA: Diagnosis not present

## 2018-07-10 ENCOUNTER — Telehealth: Payer: Self-pay | Admitting: Family Medicine

## 2018-07-10 NOTE — Telephone Encounter (Signed)
Will route to PCP for review. 

## 2018-07-10 NOTE — Telephone Encounter (Signed)
Patient daughter contacted to speak with PCP in regards to patient being hospitalized in another state. Please follow up.

## 2018-07-18 NOTE — Telephone Encounter (Signed)
Follow up   Pt's daughter is calling back to speak with someone regarding her mother. She sates its important. Please f/u

## 2018-07-19 NOTE — Telephone Encounter (Signed)
I spoke to [place] on the phone who confirmed she has spoken with her mom who said she was doing fine.  I will have her added to my schedule tomorrow to perform a telephone visit and monitor her as she is a high risk patient.

## 2018-07-19 NOTE — Telephone Encounter (Signed)
Patient daughter was called and she informed me that her mom is COVID + and she would like to talk with you about her mother. [place]

## 2018-07-20 ENCOUNTER — Emergency Department (HOSPITAL_COMMUNITY): Payer: Medicare Other

## 2018-07-20 ENCOUNTER — Ambulatory Visit (HOSPITAL_BASED_OUTPATIENT_CLINIC_OR_DEPARTMENT_OTHER): Payer: Medicare Other | Admitting: Internal Medicine

## 2018-07-20 ENCOUNTER — Inpatient Hospital Stay (HOSPITAL_COMMUNITY)
Admission: EM | Admit: 2018-07-20 | Discharge: 2018-07-26 | DRG: 177 | Disposition: A | Discharge status: Home Health | Payer: Medicare Other | Attending: Internal Medicine | Admitting: Internal Medicine

## 2018-07-20 ENCOUNTER — Encounter (HOSPITAL_COMMUNITY): Payer: Self-pay | Admitting: Emergency Medicine

## 2018-07-20 ENCOUNTER — Other Ambulatory Visit: Payer: Self-pay

## 2018-07-20 DIAGNOSIS — M109 Gout, unspecified: Secondary | ICD-10-CM | POA: Diagnosis present

## 2018-07-20 DIAGNOSIS — Z833 Family history of diabetes mellitus: Secondary | ICD-10-CM

## 2018-07-20 DIAGNOSIS — E1122 Type 2 diabetes mellitus with diabetic chronic kidney disease: Secondary | ICD-10-CM | POA: Diagnosis present

## 2018-07-20 DIAGNOSIS — R6510 Systemic inflammatory response syndrome (SIRS) of non-infectious origin without acute organ dysfunction: Secondary | ICD-10-CM

## 2018-07-20 DIAGNOSIS — Z886 Allergy status to analgesic agent status: Secondary | ICD-10-CM

## 2018-07-20 DIAGNOSIS — Z20822 Contact with and (suspected) exposure to covid-19: Secondary | ICD-10-CM

## 2018-07-20 DIAGNOSIS — J9621 Acute and chronic respiratory failure with hypoxia: Secondary | ICD-10-CM | POA: Diagnosis present

## 2018-07-20 DIAGNOSIS — M1A00X0 Idiopathic chronic gout, unspecified site, without tophus (tophi): Secondary | ICD-10-CM

## 2018-07-20 DIAGNOSIS — D72810 Lymphocytopenia: Secondary | ICD-10-CM | POA: Diagnosis not present

## 2018-07-20 DIAGNOSIS — Z9102 Food additives allergy status: Secondary | ICD-10-CM

## 2018-07-20 DIAGNOSIS — M6281 Muscle weakness (generalized): Secondary | ICD-10-CM | POA: Diagnosis not present

## 2018-07-20 DIAGNOSIS — I5032 Chronic diastolic (congestive) heart failure: Secondary | ICD-10-CM | POA: Diagnosis not present

## 2018-07-20 DIAGNOSIS — I5033 Acute on chronic diastolic (congestive) heart failure: Secondary | ICD-10-CM | POA: Diagnosis not present

## 2018-07-20 DIAGNOSIS — E785 Hyperlipidemia, unspecified: Secondary | ICD-10-CM | POA: Diagnosis present

## 2018-07-20 DIAGNOSIS — I130 Hypertensive heart and chronic kidney disease with heart failure and stage 1 through stage 4 chronic kidney disease, or unspecified chronic kidney disease: Secondary | ICD-10-CM | POA: Diagnosis present

## 2018-07-20 DIAGNOSIS — Z8673 Personal history of transient ischemic attack (TIA), and cerebral infarction without residual deficits: Secondary | ICD-10-CM

## 2018-07-20 DIAGNOSIS — Z79899 Other long term (current) drug therapy: Secondary | ICD-10-CM | POA: Diagnosis not present

## 2018-07-20 DIAGNOSIS — R0602 Shortness of breath: Secondary | ICD-10-CM | POA: Diagnosis not present

## 2018-07-20 DIAGNOSIS — J9600 Acute respiratory failure, unspecified whether with hypoxia or hypercapnia: Secondary | ICD-10-CM

## 2018-07-20 DIAGNOSIS — Z794 Long term (current) use of insulin: Secondary | ICD-10-CM | POA: Diagnosis not present

## 2018-07-20 DIAGNOSIS — R9431 Abnormal electrocardiogram [ECG] [EKG]: Secondary | ICD-10-CM | POA: Diagnosis present

## 2018-07-20 DIAGNOSIS — J1289 Other viral pneumonia: Secondary | ICD-10-CM | POA: Diagnosis present

## 2018-07-20 DIAGNOSIS — I5043 Acute on chronic combined systolic (congestive) and diastolic (congestive) heart failure: Secondary | ICD-10-CM | POA: Diagnosis present

## 2018-07-20 DIAGNOSIS — E119 Type 2 diabetes mellitus without complications: Secondary | ICD-10-CM | POA: Diagnosis not present

## 2018-07-20 DIAGNOSIS — I248 Other forms of acute ischemic heart disease: Secondary | ICD-10-CM | POA: Diagnosis present

## 2018-07-20 DIAGNOSIS — N183 Chronic kidney disease, stage 3 unspecified: Secondary | ICD-10-CM | POA: Diagnosis present

## 2018-07-20 DIAGNOSIS — J189 Pneumonia, unspecified organism: Secondary | ICD-10-CM

## 2018-07-20 DIAGNOSIS — Z91040 Latex allergy status: Secondary | ICD-10-CM

## 2018-07-20 DIAGNOSIS — I252 Old myocardial infarction: Secondary | ICD-10-CM

## 2018-07-20 DIAGNOSIS — Z7982 Long term (current) use of aspirin: Secondary | ICD-10-CM

## 2018-07-20 DIAGNOSIS — R251 Tremor, unspecified: Secondary | ICD-10-CM | POA: Diagnosis present

## 2018-07-20 DIAGNOSIS — E1165 Type 2 diabetes mellitus with hyperglycemia: Secondary | ICD-10-CM | POA: Diagnosis present

## 2018-07-20 DIAGNOSIS — R6889 Other general symptoms and signs: Secondary | ICD-10-CM

## 2018-07-20 DIAGNOSIS — R0902 Hypoxemia: Secondary | ICD-10-CM

## 2018-07-20 DIAGNOSIS — Z20818 Contact with and (suspected) exposure to other bacterial communicable diseases: Secondary | ICD-10-CM | POA: Diagnosis not present

## 2018-07-20 DIAGNOSIS — E1140 Type 2 diabetes mellitus with diabetic neuropathy, unspecified: Secondary | ICD-10-CM | POA: Diagnosis present

## 2018-07-20 DIAGNOSIS — E118 Type 2 diabetes mellitus with unspecified complications: Secondary | ICD-10-CM | POA: Diagnosis not present

## 2018-07-20 DIAGNOSIS — I10 Essential (primary) hypertension: Secondary | ICD-10-CM

## 2018-07-20 DIAGNOSIS — I2510 Atherosclerotic heart disease of native coronary artery without angina pectoris: Secondary | ICD-10-CM | POA: Diagnosis present

## 2018-07-20 DIAGNOSIS — Z823 Family history of stroke: Secondary | ICD-10-CM

## 2018-07-20 DIAGNOSIS — Z9181 History of falling: Secondary | ICD-10-CM

## 2018-07-20 DIAGNOSIS — N179 Acute kidney failure, unspecified: Secondary | ICD-10-CM | POA: Diagnosis present

## 2018-07-20 DIAGNOSIS — J1282 Pneumonia due to coronavirus disease 2019: Secondary | ICD-10-CM

## 2018-07-20 DIAGNOSIS — R531 Weakness: Secondary | ICD-10-CM | POA: Diagnosis not present

## 2018-07-20 DIAGNOSIS — E860 Dehydration: Secondary | ICD-10-CM | POA: Diagnosis present

## 2018-07-20 DIAGNOSIS — Z9109 Other allergy status, other than to drugs and biological substances: Secondary | ICD-10-CM

## 2018-07-20 DIAGNOSIS — J9690 Respiratory failure, unspecified, unspecified whether with hypoxia or hypercapnia: Secondary | ICD-10-CM | POA: Diagnosis not present

## 2018-07-20 HISTORY — DX: Chronic diastolic (congestive) heart failure: I50.32

## 2018-07-20 HISTORY — DX: Pneumonia due to coronavirus disease 2019: J12.82

## 2018-07-20 LAB — CBC
HCT: 29.1 % — ABNORMAL LOW (ref 36.0–46.0)
Hemoglobin: 8.7 g/dL — ABNORMAL LOW (ref 12.0–15.0)
MCH: 27 pg (ref 26.0–34.0)
MCHC: 29.9 g/dL — ABNORMAL LOW (ref 30.0–36.0)
MCV: 90.4 fL (ref 80.0–100.0)
Platelets: 287 10*3/uL (ref 150–400)
RBC: 3.22 MIL/uL — ABNORMAL LOW (ref 3.87–5.11)
RDW: 17.5 % — ABNORMAL HIGH (ref 11.5–15.5)
WBC: 6.7 10*3/uL (ref 4.0–10.5)
nRBC: 0 % (ref 0.0–0.2)

## 2018-07-20 LAB — CBC WITH DIFFERENTIAL/PLATELET
Abs Immature Granulocytes: 0.03 10*3/uL (ref 0.00–0.07)
Basophils Absolute: 0 10*3/uL (ref 0.0–0.1)
Basophils Relative: 0 %
Eosinophils Absolute: 0 10*3/uL (ref 0.0–0.5)
Eosinophils Relative: 0 %
HCT: 30.1 % — ABNORMAL LOW (ref 36.0–46.0)
Hemoglobin: 9.4 g/dL — ABNORMAL LOW (ref 12.0–15.0)
Immature Granulocytes: 0 %
Lymphocytes Relative: 11 %
Lymphs Abs: 0.9 10*3/uL (ref 0.7–4.0)
MCH: 27.9 pg (ref 26.0–34.0)
MCHC: 31.2 g/dL (ref 30.0–36.0)
MCV: 89.3 fL (ref 80.0–100.0)
Monocytes Absolute: 0.6 10*3/uL (ref 0.1–1.0)
Monocytes Relative: 8 %
Neutro Abs: 6.4 10*3/uL (ref 1.7–7.7)
Neutrophils Relative %: 81 %
Platelets: 316 10*3/uL (ref 150–400)
RBC: 3.37 MIL/uL — ABNORMAL LOW (ref 3.87–5.11)
RDW: 17.5 % — ABNORMAL HIGH (ref 11.5–15.5)
WBC: 7.9 10*3/uL (ref 4.0–10.5)
nRBC: 0 % (ref 0.0–0.2)

## 2018-07-20 LAB — COMPREHENSIVE METABOLIC PANEL
ALT: 22 U/L (ref 0–44)
AST: 30 U/L (ref 15–41)
Albumin: 2.7 g/dL — ABNORMAL LOW (ref 3.5–5.0)
Alkaline Phosphatase: 81 U/L (ref 38–126)
Anion gap: 14 (ref 5–15)
BUN: 41 mg/dL — ABNORMAL HIGH (ref 8–23)
CO2: 20 mmol/L — ABNORMAL LOW (ref 22–32)
Calcium: 8.5 mg/dL — ABNORMAL LOW (ref 8.9–10.3)
Chloride: 98 mmol/L (ref 98–111)
Creatinine, Ser: 2.03 mg/dL — ABNORMAL HIGH (ref 0.44–1.00)
GFR calc Af Amer: 29 mL/min — ABNORMAL LOW (ref >60)
GFR calc non Af Amer: 25 mL/min — ABNORMAL LOW (ref >60)
Glucose, Bld: 189 mg/dL — ABNORMAL HIGH (ref 70–99)
Potassium: 3.3 mmol/L — ABNORMAL LOW (ref 3.5–5.1)
Sodium: 132 mmol/L — ABNORMAL LOW (ref 135–145)
Total Bilirubin: 0.5 mg/dL (ref 0.3–1.2)
Total Protein: 6.5 g/dL (ref 6.5–8.1)

## 2018-07-20 LAB — CREATININE, SERUM
Creatinine, Ser: 2.01 mg/dL — ABNORMAL HIGH (ref 0.44–1.00)
GFR calc Af Amer: 30 mL/min — ABNORMAL LOW (ref >60)
GFR calc non Af Amer: 26 mL/min — ABNORMAL LOW (ref >60)

## 2018-07-20 LAB — BLOOD GAS, VENOUS
Acid-base deficit: 0.7 mmol/L (ref 0.0–2.0)
Bicarbonate: 24 mmol/L (ref 20.0–28.0)
FIO2: 100
O2 Saturation: 49.3 %
Patient temperature: 98.6
pCO2, Ven: 42 mmHg — ABNORMAL LOW (ref 44.0–60.0)
pH, Ven: 7.376 (ref 7.250–7.430)
pO2, Ven: 31.4 mmHg — CL (ref 32.0–45.0)

## 2018-07-20 LAB — POCT I-STAT EG7
Acid-base deficit: 2 mmol/L (ref 0.0–2.0)
Bicarbonate: 22.6 mmol/L (ref 20.0–28.0)
Calcium, Ion: 1.14 mmol/L — ABNORMAL LOW (ref 1.15–1.40)
HCT: 28 % — ABNORMAL LOW (ref 36.0–46.0)
Hemoglobin: 9.5 g/dL — ABNORMAL LOW (ref 12.0–15.0)
O2 Saturation: 58 %
Potassium: 3.4 mmol/L — ABNORMAL LOW (ref 3.5–5.1)
Sodium: 131 mmol/L — ABNORMAL LOW (ref 135–145)
TCO2: 24 mmol/L (ref 22–32)
pCO2, Ven: 37.2 mmHg — ABNORMAL LOW (ref 44.0–60.0)
pH, Ven: 7.391 (ref 7.250–7.430)
pO2, Ven: 30 mmHg — CL (ref 32.0–45.0)

## 2018-07-20 LAB — TROPONIN I
Troponin I: 1.3 ng/mL (ref <0.03)
Troponin I: 1.27 ng/mL (ref <0.03)

## 2018-07-20 LAB — CBG MONITORING, ED
Glucose-Capillary: 147 mg/dL — ABNORMAL HIGH (ref 70–99)
Glucose-Capillary: 162 mg/dL — ABNORMAL HIGH (ref 70–99)
Glucose-Capillary: 168 mg/dL — ABNORMAL HIGH (ref 70–99)

## 2018-07-20 LAB — D-DIMER, QUANTITATIVE: D-Dimer, Quant: 2.72 ug/mL-FEU — ABNORMAL HIGH (ref 0.00–0.50)

## 2018-07-20 LAB — LACTIC ACID, PLASMA: Lactic Acid, Venous: 1.2 mmol/L (ref 0.5–1.9)

## 2018-07-20 LAB — FERRITIN: Ferritin: 398 ng/mL — ABNORMAL HIGH (ref 11–307)

## 2018-07-20 LAB — BRAIN NATRIURETIC PEPTIDE: B Natriuretic Peptide: 1042.9 pg/mL — ABNORMAL HIGH (ref 0.0–100.0)

## 2018-07-20 LAB — PROCALCITONIN: Procalcitonin: 1.02 ng/mL

## 2018-07-20 LAB — TRIGLYCERIDES: Triglycerides: 137 mg/dL (ref <150)

## 2018-07-20 LAB — FIBRINOGEN: Fibrinogen: >800 mg/dL — ABNORMAL HIGH (ref 210–475)

## 2018-07-20 LAB — LACTATE DEHYDROGENASE: LDH: 206 U/L — ABNORMAL HIGH (ref 98–192)

## 2018-07-20 LAB — SEDIMENTATION RATE: Sed Rate: 135 mm/hr — ABNORMAL HIGH (ref 0–22)

## 2018-07-20 MED ORDER — HYDROXYCHLOROQUINE SULFATE 200 MG PO TABS: 200.0000 mg | ORAL_TABLET | Freq: Two times a day (BID) | ORAL | Status: DC

## 2018-07-20 MED ORDER — SODIUM CHLORIDE 0.9 % IV SOLN
2.0000 g | Freq: Once | INTRAVENOUS | Status: AC
  Administered 2018-07-20: 2 g via INTRAVENOUS
  Filled 2018-07-20: qty 2

## 2018-07-20 MED ORDER — ASPIRIN EC 81 MG PO TBEC: 81.0000 mg | DELAYED_RELEASE_TABLET | Freq: Every day | ORAL | Status: DC

## 2018-07-20 MED ORDER — ACETAMINOPHEN 500 MG PO TABS
1000.0000 mg | ORAL_TABLET | Freq: Once | ORAL | Status: AC
  Administered 2018-07-20: 1000 mg via ORAL
  Filled 2018-07-20: qty 2

## 2018-07-20 MED ORDER — CARVEDILOL 12.5 MG PO TABS: 12.5000 mg | ORAL_TABLET | Freq: Two times a day (BID) | ORAL | Status: DC

## 2018-07-20 MED ORDER — INSULIN ASPART 100 UNIT/ML ~~LOC~~ SOLN
0.0000 [IU] | Freq: Every day | SUBCUTANEOUS | Status: DC
  Administered 2018-07-23 – 2018-07-24 (×2): 2 [IU] via SUBCUTANEOUS

## 2018-07-20 MED ORDER — PANTOPRAZOLE SODIUM 40 MG PO TBEC
40.0000 mg | DELAYED_RELEASE_TABLET | Freq: Every day | ORAL | Status: DC
  Administered 2018-07-21 – 2018-07-26 (×6): 40 mg via ORAL
  Filled 2018-07-20 (×6): qty 1

## 2018-07-20 MED ORDER — INSULIN ASPART 100 UNIT/ML ~~LOC~~ SOLN
0.0000 [IU] | Freq: Three times a day (TID) | SUBCUTANEOUS | Status: DC
  Administered 2018-07-21 (×3): 2 [IU] via SUBCUTANEOUS
  Administered 2018-07-22: 3 [IU] via SUBCUTANEOUS
  Administered 2018-07-22 – 2018-07-24 (×5): 2 [IU] via SUBCUTANEOUS
  Administered 2018-07-25: 3 [IU] via SUBCUTANEOUS
  Administered 2018-07-25: 2 [IU] via SUBCUTANEOUS
  Administered 2018-07-26 (×2): 1 [IU] via SUBCUTANEOUS

## 2018-07-20 MED ORDER — HYDROXYCHLOROQUINE SULFATE 200 MG PO TABS
400.0000 mg | ORAL_TABLET | Freq: Two times a day (BID) | ORAL | Status: DC
  Filled 2018-07-20: qty 2

## 2018-07-20 MED ORDER — ONDANSETRON HCL 4 MG/2ML IJ SOLN: 4.0000 mg | Freq: Four times a day (QID) | INTRAMUSCULAR | Status: DC | PRN

## 2018-07-20 MED ORDER — INSULIN GLARGINE 100 UNIT/ML ~~LOC~~ SOLN
20.0000 [IU] | Freq: Every day | SUBCUTANEOUS | Status: DC
  Administered 2018-07-20 – 2018-07-23 (×4): 20 [IU] via SUBCUTANEOUS
  Filled 2018-07-20 (×5): qty 0.2

## 2018-07-20 MED ORDER — FUROSEMIDE 10 MG/ML IJ SOLN
40.0000 mg | Freq: Once | INTRAMUSCULAR | Status: DC
  Filled 2018-07-20: qty 4

## 2018-07-20 MED ORDER — SODIUM CHLORIDE 0.9 % IV SOLN: 250.0000 mL | INTRAVENOUS | Status: DC | PRN

## 2018-07-20 MED ORDER — ENOXAPARIN SODIUM 40 MG/0.4ML ~~LOC~~ SOLN
40.0000 mg | Freq: Every day | SUBCUTANEOUS | Status: DC
  Administered 2018-07-21: 40 mg via SUBCUTANEOUS
  Filled 2018-07-20: qty 0.4

## 2018-07-20 MED ORDER — TOCILIZUMAB 400 MG/20ML IV SOLN
600.0000 mg | Freq: Once | INTRAVENOUS | Status: AC
  Administered 2018-07-21: 600 mg via INTRAVENOUS
  Filled 2018-07-20: qty 10

## 2018-07-20 MED ORDER — ASPIRIN EC 325 MG PO TBEC
325.0000 mg | DELAYED_RELEASE_TABLET | Freq: Every day | ORAL | Status: DC
  Administered 2018-07-20 – 2018-07-23 (×4): 325 mg via ORAL
  Filled 2018-07-20 (×5): qty 1

## 2018-07-20 MED ORDER — KCL IN DEXTROSE-NACL 40-5-0.9 MEQ/L-%-% IV SOLN
INTRAVENOUS | Status: DC
  Administered 2018-07-20: 75 mL/h via INTRAVENOUS
  Filled 2018-07-20 (×2): qty 1000

## 2018-07-20 MED ORDER — LOSARTAN POTASSIUM 25 MG PO TABS: 25.0000 mg | ORAL_TABLET | Freq: Every day | ORAL | Status: DC

## 2018-07-20 MED ORDER — HYDROXYCHLOROQUINE SULFATE 200 MG PO TABS: 400.0000 mg | ORAL_TABLET | Freq: Two times a day (BID) | ORAL | Status: DC

## 2018-07-20 MED ORDER — FUROSEMIDE 40 MG PO TABS: 40.0000 mg | ORAL_TABLET | Freq: Two times a day (BID) | ORAL | Status: DC

## 2018-07-20 MED ORDER — ONDANSETRON HCL 4 MG PO TABS: 4.0000 mg | ORAL_TABLET | Freq: Four times a day (QID) | ORAL | Status: DC | PRN

## 2018-07-20 MED ORDER — SODIUM CHLORIDE 0.9% FLUSH
3.0000 mL | Freq: Two times a day (BID) | INTRAVENOUS | Status: DC
  Administered 2018-07-20 – 2018-07-26 (×12): 3 mL via INTRAVENOUS

## 2018-07-20 MED ORDER — ALLOPURINOL 300 MG PO TABS
300.0000 mg | ORAL_TABLET | Freq: Every day | ORAL | Status: DC
  Administered 2018-07-21 – 2018-07-26 (×6): 300 mg via ORAL
  Filled 2018-07-20 (×4): qty 1
  Filled 2018-07-20: qty 3
  Filled 2018-07-20 (×2): qty 1

## 2018-07-20 MED ORDER — ACETAMINOPHEN 325 MG PO TABS
650.0000 mg | ORAL_TABLET | Freq: Four times a day (QID) | ORAL | Status: DC | PRN
  Administered 2018-07-22 – 2018-07-25 (×4): 650 mg via ORAL
  Filled 2018-07-20 (×4): qty 2

## 2018-07-20 MED ORDER — ACETAMINOPHEN 650 MG RE SUPP: 650.0000 mg | Freq: Four times a day (QID) | RECTAL | Status: DC | PRN

## 2018-07-20 MED ORDER — SODIUM CHLORIDE 0.9% FLUSH: 3.0000 mL | INTRAVENOUS | Status: DC | PRN

## 2018-07-20 MED ORDER — VANCOMYCIN HCL 10 G IV SOLR
1250.0000 mg | Freq: Once | INTRAVENOUS | Status: DC
  Administered 2018-07-20: 1250 mg via INTRAVENOUS
  Filled 2018-07-20: qty 1250

## 2018-07-20 NOTE — Progress Notes (Signed)
Virtual Visit via Telephone Note  I connected with Kara Hanson on 07/20/18 at 10:36 a.m by telephone from my office and verified that I am speaking with the correct person using two identifiers. Pt is at home. Only the pt and myself were on the line initially.  However I did speak with her daughter-in-law Kara Hanson also at the end of the conversation.   I discussed the limitations, risks, security and privacy concerns of performing an evaluation and management service by telephone and the availability of in person appointments. I also discussed with the patient that there may be a patient responsible charge related to this service. The patient expressed understanding and agreed to proceed.   History of Present Illness: 63 year old female with a history of type 2 diabetes mellitus (A1c 9.6), hypertension, congestive heart failure (EF 50-55% from echo of 03/2018), CKD.  PCP is Dr. Margarita Rana  Patient is a 63 year old female who resides in [place] but had travel to [place] in early January to visit her mother.  While there she developed an acute respiratory illness with sore throat, shortness of breath and fever.  She ended up being hospitalized in the ICU on CPAP.  She was discharged 06/10/2020 a rehab facility there where she remained until 07/14/2018.  She came back to [place] on the day of discharge from the rehab facility.  She was tested for COVID-19 at the rehab on 07/12/2018.  She received a call this Monday informing her that she was COVID positive and self quarantine was recommended.  Patient currently lives with her son and daughter-in-law and 2 grandchildren.  Patient has self quarantine and one-bedroom of the house since receiving that call.  Currently no fevers, mild cough.  No sore throat.  Endorses shortness of breath that she feels is worse and can hear rattling in her chest.  She feels that she has fluid buildup in the lungs due to CHF.  She endorses PND and sleeps on 2 pillows.  She has to  get up 2-3 times at nights to sit on the edge of the bed due to shortness of breath.  Endorses lower extremity edema.  Reports compliance with furosemide.  She is very winded when trying to get out of bed.  Her daughter-in-law has to help her get to the bathroom.  She is very weak.  Endorses dec sense of smell and appetite.  She has been drinking only water and tea.  Unable to eat solid foods.  Had diarrhea for 2 days.  Dec strength in legs; fell 2 nights ago while trying to get out of bed.  Fell on buttock.  She reports new right-sided weakness especially in the upper extremity.  Observations/Objective: No direct observation done as this was a telephone encounter.  Assessment and Plan: 1. COVID-19 virus infection 2. Acute on chronic diastolic congestive heart failure ([place]) 3. Acute right-sided muscle weakness 4. History of recent fall  Patient is known COVID positive and is at very high risk for complication due to her underlying chronic medical conditions.  Given her worsening shortness of breath, PND, anorexia, weakness with recent fall, I recommend that she be seen in the emergency room.  Patient and daughter-in-law are agreeable to this.  I recommend that they call the ambulance and to inform the 911 operator that the patient is COVID positive.  I have called the charge nurse at [facility] and give an update on this patient.   Follow Up Instructions:    I discussed the  assessment and treatment plan with the patient. The patient was provided an opportunity to ask questions and all were answered. The patient agreed with the plan and demonstrated an understanding of the instructions.   The patient was advised to call back or seek an in-person evaluation if the symptoms worsen or if the condition fails to improve as anticipated.  I provided 30 minutes of non-face-to-face time during this encounter.   Karle Plumber, MD

## 2018-07-20 NOTE — ED Triage Notes (Signed)
Patient arrived by EMS from doctors office. PT c/o SOB. Pt recently traveled to [place]. PT received a positive COVID-19 test last Thursday (4/9). Pt was previously in ICU for 2 months w/ unknown reasoning per EMS.   Hx of CHF,DM, and spinal stenosis.   EMS VS BP 148/90, SpO2 98% on 10L Non-Rebreather and 75% on RA, CBG 150.

## 2018-07-20 NOTE — ED Notes (Signed)
This Probation officer attempted to call report x 1 to [facility]. This Probation officer was told that patient doesn't have nurse assigned to room. [facility] will call when ready for report. Call back number [phone].

## 2018-07-20 NOTE — Consult Note (Signed)
NAME:  Kara Hanson, MRN:  [id number], DOB:  1955-06-01, LOS: 0 ADMISSION DATE:  07/20/2018, CONSULTATION DATE: 07/20/2018 REFERRING MD: Dr. Wynelle Cleveland, CHIEF COMPLAINT: Hypoxic respiratory failure  Brief History     History of present illness   63 year old chronically ill woman with a history of hypertension, systolic and diastolic chronic CHF, CAD, diabetes, prior stroke, chronic renal insufficiency stage III.  She was hospitalized for acute on chronic respiratory failure in late January/early February in [place], required ICU care.  Was treated for exacerbation of CHF and also possible community-acquired pneumonia.  She was discharged to a rehab facility and was starting to improve, walking the hallways on room air.  Apparently she had a chest x-ray that was concerning for possible residual pneumonia and she was treated with levofloxacin.  Also per her report she had a direct exposure to the novel coronavirus through her caregivers and was tested on 07/12/2018 just before she was discharged from rehab to travel back to [place].  She was informed that the test was positive on 4/13 and instructed to self isolate.  Since coming home she has had some slow progressive dyspnea.  Beginning about 5 to 6 days ago she developed some sore throat, dry cough, nausea without emesis, change in her sense of smell and taste.  She denies any chest pain, diarrhea.  She may have had a slight increase in her lower extremity edema.  She is unsure if she is had any weight gain.  She denies any noncompliance with her medications in particular her diuretics.  No fevers at home. She presented to the ED with hypoxemia, initially required 1.00 NRB, then to 12L/min HFNC.  Temperature on evaluation in the ED 4/17 100.9 rectal.  Chest x-ray 4/17 reviewed by me shows prominent bibasilar interstitial markings with possible left upper lobe infiltrate as well.  Labs significant for acute on chronic renal insufficiency, elevated BNP,  troponin I 0.30 in absence of chest pain.  Past Medical History   has a past medical history of CHF (congestive heart failure) ([place]), CVA (cerebral vascular accident) ([place]), Diabetes mellitus without complication ([place]), Hypercholesteremia, Hypertension, Myocardial infarction ([place]), and Spinal stenosis.   Significant Hospital Events     Consults:    Procedures:    Significant Diagnostic Tests:    Micro Data:  Blood 4/17 >>   Antimicrobials:  Cefepime 4/17 >>   Interim history/subjective:  She states that her dyspnea has improved since oxygen has been applied.  Denies any pain.  Still has some dry cough  Objective   Blood pressure (!) 105/56, pulse 67, temperature (!) 100.9 F (38.3 C), temperature source Rectal, resp. rate 15, height 5' (1.524 m), weight 68 kg, SpO2 95 %.        Intake/Output Summary (Last 24 hours) at 07/20/2018 1701 Last data filed at 07/20/2018 1613 Gross per 24 hour  Intake 100 ml  Output --  Net 100 ml   Filed Weights   07/20/18 1328  Weight: 68 kg    Examination: General: Pleasant ill-appearing obese woman, no distress on oxygen HENT: Oropharynx moist, no lesions, sclera anicteric and without edema Lungs: Decreased but clear, no wheezing, no crackles Cardiovascular: Regular, borderline bradycardia, no murmur Abdomen: Obese, soft, nondistended, mild diffuse tenderness but no rebound, no guarding, positive bowel sounds Extremities: Tender palpation, trace pretibial edema Neuro: Awake, alert, interacting appropriately, follows commands, moves all extremities  Resolved Hospital Problem list     Assessment & Plan:  Acute hypoxemic respiratory failure  with mild but bilateral infiltrates.  Differential diagnosis includes acute exacerbation of her known CHF.  Also evolving viral pneumonitis from her documented COVID-19.  Less likely but possible bacterial healthcare associated pneumonia.  She was just treated with antibiotics, completed Levaquin.   -Wean her FiO2 as able.  I was able to get her high flow nasal cannula down to 3 L/min -Started empirically on cefepime.  Would narrow quickly if suspicion for bacterial process decreases, following procalcitonin, WBC, clinical status, cultures -PCCM available if she shows progressive decline.  COVID-19 infection -Agree with tocilizumab, hydroxychloroquine as ordered -At risk for progression of her pulmonary infiltrates, acute lung injury.  Follow closely for any interval worsening -She can likely be admitted to a floor bed, possibly to [facility] if she continues to be stable -NO BiPAP  Hypertension with systolic and diastolic CHF -Diuresis as her renal function will allow  Acute on chronic renal insufficiency -Follow BMP, urine output with diuresis  Best practice:  Diet: Heart healthy, carb modified Pain/Anxiety/Delirium protocol (if indicated): N/A VAP protocol (if indicated): N/A DVT prophylaxis: Enoxaparin GI prophylaxis: PPI Glucose control: SSI Mobility: Out of bed with assist Code Status: Full code Family Communication: No family present currently.  Discussed plan of care with the patient and with Dr. Wynelle Cleveland Disposition: Med Floor bed  Labs   CBC: Recent Labs  Lab 07/20/18 1340 07/20/18 1357  WBC 7.9  --   NEUTROABS 6.4  --   HGB 9.4* 9.5*  HCT 30.1* 28.0*  MCV 89.3  --   PLT 316  --     Basic Metabolic Panel: Recent Labs  Lab 07/20/18 1340 07/20/18 1357  NA 132* 131*  K 3.3* 3.4*  CL 98  --   CO2 20*  --   GLUCOSE 189*  --   BUN 41*  --   CREATININE 2.03*  --   CALCIUM 8.5*  --    GFR: Estimated Creatinine Clearance: 24.4 mL/min (A) (by C-G formula based on SCr of 2.03 mg/dL (H)). Recent Labs  Lab 07/20/18 1340  WBC 7.9  LATICACIDVEN 1.2    Liver Function Tests: Recent Labs  Lab 07/20/18 1340  AST 30  ALT 22  ALKPHOS 81  BILITOT 0.5  PROT 6.5  ALBUMIN 2.7*   No results for input(s): LIPASE, AMYLASE in the last 168 hours. No results for  input(s): AMMONIA in the last 168 hours.  ABG    Component Value Date/Time   HCO3 22.6 07/20/2018 1357   TCO2 24 07/20/2018 1357   ACIDBASEDEF 2.0 07/20/2018 1357   O2SAT 58.0 07/20/2018 1357     Coagulation Profile: No results for input(s): INR, PROTIME in the last 168 hours.  Cardiac Enzymes: Recent Labs  Lab 07/20/18 1340  TROPONINI 1.30*    HbA1C: Hemoglobin A1C  Date/Time Value Ref Range Status  03/29/2018 11:19 AM 11.8 (A) 4.0 - 5.6 % Final   HbA1c, POC (controlled diabetic range)  Date/Time Value Ref Range Status  09/28/2017 03:40 PM 13.3 (A) 0.0 - 7.0 % Final   Hgb A1c MFr Bld  Date/Time Value Ref Range Status  11/27/2017 07:23 AM 9.6 (H) 4.8 - 5.6 % Final    Comment:    (NOTE) Pre diabetes:          5.7%-6.4% Diabetes:              >6.4% Glycemic control for   <7.0% adults with diabetes   10/01/2016 01:53 AM 13.8 (H) 4.8 - 5.6 % Final  Comment:    (NOTE)         Pre-diabetes: 5.7 - 6.4         Diabetes: >6.4         Glycemic control for adults with diabetes: <7.0     CBG: Recent Labs  Lab 07/20/18 1345  GLUCAP 147*    Review of Systems:   Patient complains of nausea without emesis, some anorexia, dry cough and sore throat that began about 5 or 6 days ago.  Slow progressive dyspnea over about the last 7 to 10 days, worst over the last 24 hours.  Past Medical History  She,  has a past medical history of CHF (congestive heart failure) ([place]), CVA (cerebral vascular accident) ([place]), Diabetes mellitus without complication ([place]), Hypercholesteremia, Hypertension, Myocardial infarction ([place]), and Spinal stenosis.   Surgical History    Past Surgical History:  Procedure Laterality Date   BLADDER SURGERY     CARDIAC CATHETERIZATION N/A 04/25/2016   Procedure: Left Heart Cath and Coronary Angiography;  Surgeon: Lorretta Harp, MD;  Location: [facility];  Service: Cardiovascular;  Laterality: N/A;   CESAREAN SECTION     CHOLECYSTECTOMY        Social History   reports that she has never smoked. She has never used smokeless tobacco. She reports that she does not drink alcohol or use drugs.   Family History   Her family history includes Diabetes Mellitus II in her father; Stroke in her father.   Allergies Allergies  Allergen Reactions   Garlic Shortness Of Breath, Itching and Swelling    Hand itching and swelling   Latex Itching   Morphine And Related Itching and Other (See Comments)    Headache    Other Itching    Reaction to newspaper ink -itching and headache     Home Medications  Prior to Admission medications   Medication Sig Start Date End Date Taking? Authorizing Provider  allopurinol (ZYLOPRIM) 300 MG tablet TAKE 1 TABLET (300 MG TOTAL) BY MOUTH DAILY. 03/20/18  Yes [name], MD  aspirin EC 81 MG tablet Take 81 mg by mouth daily.   Yes [provider]  Blood Glucose Monitoring Suppl (ONE TOUCH ULTRA MINI) w/Device KIT UAD UP TO THREE TIMES DAILY DX E11.4 11/20/17  Yes [name], MD  carvedilol (COREG) 12.5 MG tablet Take 1 tablet (12.5 mg total) by mouth 2 (two) times daily with a meal. 03/29/18  Yes Newlin, Enobong, MD  ergocalciferol (DRISDOL) 1.25 MG (50000 UT) capsule Take 1 capsule (50,000 Units total) by mouth once a week. Patient taking differently: Take 50,000 Units by mouth once a week. Takes on Tuesdays 04/02/18  Yes [name], MD  furosemide (LASIX) 40 MG tablet Take 1 tablet (40 mg total) by mouth 2 (two) times daily. 03/24/18  Yes Elodia Florence., MD  glucose blood (ONE TOUCH ULTRA TEST) test strip UAD UP TO THREE TIMES DAILY DX E11.4 03/29/18  Yes [name], MD  insulin aspart (NOVOLOG) 100 UNIT/ML injection 0 to 12 units subcutaneously 3 times daily before meals as per sliding scale 03/29/18  Yes Newlin, Enobong, MD  Insulin Glargine (BASAGLAR KWIKPEN) 100 UNIT/ML SOPN Inject 0.35 mLs (35 Units total) into the skin at bedtime. 03/29/18  Yes Newlin,  Charlane Ferretti, MD  Insulin Pen Needle 31G X 5 MM MISC 1 each by Does not apply route at bedtime. 12/13/17  Yes [name], MD  Lancet Device MISC Use as instructed 3 times daily 09/19/17  Yes Fawze, Mina A, PA-C  losartan (COZAAR) 25 MG tablet Take 25 mg by mouth daily.   Yes [provider]  omeprazole (PRILOSEC) 20 MG capsule Take 20 mg by mouth daily.   Yes [provider]  [facility] DELICA LANCETS 14Y MISC UAD UP TO THREE TIMES DAILY DX E11.4 11/20/17  Yes [name], MD  SUPER B COMPLEX/C CAPS Take 1 capsule by mouth daily.   Yes [provider]  tiZANidine (ZANAFLEX) 4 MG tablet Take 1 tablet (4 mg total) by mouth every 8 (eight) hours as needed for muscle spasms. 03/29/18  Yes [name], MD  amLODipine (NORVASC) 5 MG tablet Take 1 tablet (5 mg total) by mouth at bedtime. 03/29/18 04/28/18  [name], MD  atorvastatin (LIPITOR) 80 MG tablet Take 1 tablet (80 mg total) by mouth daily at 6 PM. 03/24/18 04/23/18  Elodia Florence., MD  dicyclomine (BENTYL) 20 MG tablet Take 1 tablet (20 mg total) by mouth 2 (two) times daily. Patient not taking: Reported on 03/20/2018 01/02/18   Maudie Flakes, MD  ezetimibe (ZETIA) 10 MG tablet Take 1 tablet (10 mg total) by mouth daily. 03/25/18 04/24/18  Elodia Florence., MD  gabapentin (NEURONTIN) 300 MG capsule Take 1 capsule (300 mg total) by mouth 2 (two) times daily. 03/29/18 04/28/18  [name], MD  lansoprazole (PREVACID) 15 MG capsule Take 1 capsule (15 mg total) by mouth daily. Patient not taking: Reported on 07/20/2018 03/29/18   [name], MD  metoCLOPramide (REGLAN) 5 MG tablet Take 1 tablet (5 mg total) by mouth 3 (three) times daily before meals. Patient not taking: Reported on 03/20/2018 11/14/17   [name], MD      Baltazar Apo, MD, PhD 07/20/2018, 5:21 PM [facility] [phone] or if no answer [phone]

## 2018-07-20 NOTE — Progress Notes (Signed)
Pt states she doesn't like the taste of food   Pt states she is feeling better than yesterday   Pt states she is having discomfort in her right shoulder

## 2018-07-20 NOTE — ED Provider Notes (Signed)
[facility] DEPT Provider Note   CSN: [id number] Arrival date & time: 07/20/18  1245    History   Chief Complaint Chief Complaint  Patient presents with  . Shortness of Breath    HPI Kara Hanson is a 63 y.o. female with PMH/o CHF, CVA, DM, HTN, MI who is KNOWN COVID-19 POSITIVE who presents for evaluation of SOB that has been progressively worsening. Patient reports that she traveled to [place] in late January/early February to visit her mother. While there, she states she had a respiratory illness which caused an exacerbation of her CHF and was admitted to the ICU for approximately a month. She reports she was discharged from the hospital and sent to a rehab facility to regain strength. While there, patient reports she started having some cough and was tested for COVID-19. Additionally her CXR showed questionable pneumonia and she was started on Levaquin which she states she just finished a few days ago. Patient reports that on 07/12/18 she was informed that her test was positive for COVID-19. Patient was discharged from the rehab facility and sent home where she has been self-isolating for the last few days. She noted that about 5 days ago, her SOB was getting slightly worse. She states she has also continued to have some productive cough with brown sputum. She states that today, she felt like the SOB significantly worsened. Additionally, she has had generalized weakness, nausea, decreased appetite. She has not been eating or drinking much over the last few days but has still been taking her insulin. She has no history of COPD, asthma. She does have history of CHF and reports she is compliant with her Lasix. Patient denies any CP, abdominal pain, vomiting, numbness of her extremities.      The history is provided by the patient.    Past Medical History:  Diagnosis Date  . CHF (congestive heart failure) ([place])   . CVA (cerebral vascular accident) ([place])    . Diabetes mellitus without complication ([place])   . Hypercholesteremia   . Hypertension   . Myocardial infarction ([place])   . Spinal stenosis     Patient Active Problem List   Diagnosis Date Noted  . COVID-19 virus infection 07/20/2018  . Vitamin D deficiency 04/02/2018  . Spinal stenosis 03/29/2018  . Acute and chronic respiratory failure with hypoxia ([place]) 03/20/2018  . Diabetes mellitus type 2 with complications, uncontrolled ([place]) 03/20/2018  . CKD (chronic kidney disease), stage III ([place]) 03/20/2018  . Acute on chronic diastolic CHF (congestive heart failure) ([place]) 11/27/2017  . Acute kidney injury ([place]) 11/27/2017  . Acute respiratory distress 11/27/2017  . Bulging lumbar disc 11/14/2017  . Gout 11/14/2017  . HLD (hyperlipidemia) 09/30/2016  . Chest pain 06/17/2016  . Stress-induced cardiomyopathy 05/19/2016  . Type 2 diabetes mellitus with diabetic neuropathy, unspecified ([place]) 05/06/2016  . Vertigo 05/06/2016  . Controlled type 2 diabetes mellitus with hyperglycemia ([place]) 04/23/2016  . Hypertension 04/23/2016  . Normochromic normocytic anemia 04/23/2016  . Syncope 04/23/2016    Past Surgical History:  Procedure Laterality Date  . BLADDER SURGERY    . CARDIAC CATHETERIZATION N/A 04/25/2016   Procedure: Left Heart Cath and Coronary Angiography;  Surgeon: Lorretta Harp, MD;  Location: [facility];  Service: Cardiovascular;  Laterality: N/A;  . CESAREAN SECTION    . CHOLECYSTECTOMY       OB History   No obstetric history on file.      Home Medications  Prior to Admission medications   Medication Sig Start Date End Date Taking? Authorizing Provider  allopurinol (ZYLOPRIM) 300 MG tablet TAKE 1 TABLET (300 MG TOTAL) BY MOUTH DAILY. 03/20/18  Yes [name], MD  aspirin EC 81 MG tablet Take 81 mg by mouth daily.   Yes [provider]  Blood Glucose Monitoring Suppl (ONE TOUCH ULTRA MINI) w/Device KIT UAD UP TO THREE TIMES DAILY DX E11.4  11/20/17  Yes [name], MD  carvedilol (COREG) 12.5 MG tablet Take 1 tablet (12.5 mg total) by mouth 2 (two) times daily with a meal. 03/29/18  Yes Newlin, Enobong, MD  ergocalciferol (DRISDOL) 1.25 MG (50000 UT) capsule Take 1 capsule (50,000 Units total) by mouth once a week. Patient taking differently: Take 50,000 Units by mouth once a week. Takes on Tuesdays 04/02/18  Yes [name], MD  furosemide (LASIX) 40 MG tablet Take 1 tablet (40 mg total) by mouth 2 (two) times daily. 03/24/18  Yes Elodia Florence., MD  glucose blood (ONE TOUCH ULTRA TEST) test strip UAD UP TO THREE TIMES DAILY DX E11.4 03/29/18  Yes [name], MD  insulin aspart (NOVOLOG) 100 UNIT/ML injection 0 to 12 units subcutaneously 3 times daily before meals as per sliding scale 03/29/18  Yes Newlin, Enobong, MD  Insulin Glargine (BASAGLAR KWIKPEN) 100 UNIT/ML SOPN Inject 0.35 mLs (35 Units total) into the skin at bedtime. 03/29/18  Yes Newlin, Charlane Ferretti, MD  Insulin Pen Needle 31G X 5 MM MISC 1 each by Does not apply route at bedtime. 12/13/17  Yes [name], MD  Lancet Device MISC Use as instructed 3 times daily 09/19/17  Yes Fawze, Mina A, PA-C  losartan (COZAAR) 25 MG tablet Take 25 mg by mouth daily.   Yes [provider]  omeprazole (PRILOSEC) 20 MG capsule Take 20 mg by mouth daily.   Yes [provider]  [facility] DELICA LANCETS 54Y MISC UAD UP TO THREE TIMES DAILY DX E11.4 11/20/17  Yes [name], MD  SUPER B COMPLEX/C CAPS Take 1 capsule by mouth daily.   Yes [provider]  tiZANidine (ZANAFLEX) 4 MG tablet Take 1 tablet (4 mg total) by mouth every 8 (eight) hours as needed for muscle spasms. 03/29/18  Yes [name], MD  amLODipine (NORVASC) 5 MG tablet Take 1 tablet (5 mg total) by mouth at bedtime. 03/29/18 04/28/18  [name], MD  atorvastatin (LIPITOR) 80 MG tablet Take 1 tablet (80 mg total) by mouth daily at 6 PM. 03/24/18 04/23/18  Elodia Florence., MD  dicyclomine (BENTYL) 20 MG tablet Take 1 tablet (20 mg total) by mouth 2 (two) times daily. Patient not taking: Reported on 03/20/2018 01/02/18   Maudie Flakes, MD  ezetimibe (ZETIA) 10 MG tablet Take 1 tablet (10 mg total) by mouth daily. 03/25/18 04/24/18  Elodia Florence., MD  gabapentin (NEURONTIN) 300 MG capsule Take 1 capsule (300 mg total) by mouth 2 (two) times daily. 03/29/18 04/28/18  [name], MD  lansoprazole (PREVACID) 15 MG capsule Take 1 capsule (15 mg total) by mouth daily. Patient not taking: Reported on 07/20/2018 03/29/18   [name], MD  metoCLOPramide (REGLAN) 5 MG tablet Take 1 tablet (5 mg total) by mouth 3 (three) times daily before meals. Patient not taking: Reported on 03/20/2018 11/14/17   [name], MD    Family History Family History  Problem Relation Age of Onset  . Diabetes Mellitus II Father   . Stroke Father  Social History Social History   Tobacco Use  . Smoking status: Never Smoker  . Smokeless tobacco: Never Used  Substance Use Topics  . Alcohol use: No  . Drug use: No     Allergies   Garlic; Latex; Morphine and related; and Other   Review of Systems Review of Systems  Constitutional: Positive for activity change and appetite change. Negative for fever.  Respiratory: Positive for cough and shortness of breath.   Cardiovascular: Negative for chest pain.  Gastrointestinal: Positive for nausea. Negative for abdominal pain and vomiting.  Genitourinary: Negative for dysuria and hematuria.  Neurological: Positive for weakness (generalized) and headaches. Negative for numbness.  All other systems reviewed and are negative.    Physical Exam Updated Vital Signs BP (!) 105/56 (BP Location: Right Arm)   Pulse 67   Temp (!) 100.9 F (38.3 C) (Rectal)   Resp 15   Ht 5' (1.524 m)   Wt 68 kg   SpO2 95%   BMI 29.29 kg/m   Physical Exam Vitals signs and nursing note reviewed.  Constitutional:       Appearance: Normal appearance. She is well-developed. She is ill-appearing.  HENT:     Head: Normocephalic and atraumatic.  Eyes:     General: Lids are normal.     Conjunctiva/sclera: Conjunctivae normal.     Pupils: Pupils are equal, round, and reactive to light.  Neck:     Musculoskeletal: Full passive range of motion without pain.  Cardiovascular:     Rate and Rhythm: Normal rate and regular rhythm.     Pulses: Normal pulses.          Radial pulses are 2+ on the right side and 2+ on the left side.       Dorsalis pedis pulses are 2+ on the right side and 2+ on the left side.     Heart sounds: Normal heart sounds. No murmur. No friction rub. No gallop.   Pulmonary:     Effort: Pulmonary effort is normal. Tachypnea present.     Breath sounds: Rales present.     Comments: Faint rales noted at the bilateral lung bases. No wheezing. Increased work of breathing noted. She is able to speak in short sentences.  Abdominal:     Palpations: Abdomen is soft. Abdomen is not rigid.     Tenderness: There is no abdominal tenderness. There is no guarding.     Comments: Lungs clear to auscultation bilaterally.  Symmetric chest rise.  No wheezing, rales, rhonchi.  Musculoskeletal: Normal range of motion.     Comments: BLE are symmetric in appearance without any overlying warmth, erythema, edema.   Skin:    General: Skin is warm and dry.     Capillary Refill: Capillary refill takes less than 2 seconds.  Neurological:     Mental Status: She is alert and oriented to person, place, and time.  Psychiatric:        Speech: Speech normal.      ED Treatments / Results  Labs (all labs ordered are listed, but only abnormal results are displayed) Labs Reviewed  BRAIN NATRIURETIC PEPTIDE - Abnormal; Notable for the following components:      Result Value   B Natriuretic Peptide 1,042.9 (*)    All other components within normal limits  COMPREHENSIVE METABOLIC PANEL - Abnormal; Notable for the  following components:   Sodium 132 (*)    Potassium 3.3 (*)    CO2 20 (*)    Glucose,  Bld 189 (*)    BUN 41 (*)    Creatinine, Ser 2.03 (*)    Calcium 8.5 (*)    Albumin 2.7 (*)    GFR calc non Af Amer 25 (*)    GFR calc Af Amer 29 (*)    All other components within normal limits  CBC WITH DIFFERENTIAL/PLATELET - Abnormal; Notable for the following components:   RBC 3.37 (*)    Hemoglobin 9.4 (*)    HCT 30.1 (*)    RDW 17.5 (*)    All other components within normal limits  TROPONIN I - Abnormal; Notable for the following components:   Troponin I 1.30 (*)    All other components within normal limits  BLOOD GAS, VENOUS - Abnormal; Notable for the following components:   pCO2, Ven 42.0 (*)    pO2, Ven 31.4 (*)    All other components within normal limits  CBG MONITORING, ED - Abnormal; Notable for the following components:   Glucose-Capillary 147 (*)    All other components within normal limits  POCT I-STAT EG7 - Abnormal; Notable for the following components:   pCO2, Ven 37.2 (*)    pO2, Ven 30.0 (*)    Sodium 131 (*)    Potassium 3.4 (*)    Calcium, Ion 1.14 (*)    HCT 28.0 (*)    Hemoglobin 9.5 (*)    All other components within normal limits  CULTURE, BLOOD (ROUTINE X 2)  CULTURE, BLOOD (ROUTINE X 2)  LACTIC ACID, PLASMA  LACTIC ACID, PLASMA  CBC  CREATININE, SERUM    EKG EKG Interpretation  Date/Time:  Friday July 20 2018 15:33:58 EDT Ventricular Rate:  73 PR Interval:    QRS Duration: 92 QT Interval:  498 QTC Calculation: 549 R Axis:   32 Text Interpretation:  Sinus rhythm Borderline repolarization abnormality Prolonged QT interval Since last EKG, QT is prolonged Confirmed by Duffy Bruce [phone]) on 07/20/2018 3:37:04 PM   Radiology Dg Chest Portable 1 View  Result Date: 07/20/2018 CLINICAL DATA:  Shortness of breath.  Recent positive COVID-19 test EXAM: PORTABLE CHEST 1 VIEW COMPARISON:  March 20, 2018 FINDINGS: There are areas of airspace  consolidation in both lung bases as well as in the left upper lobe. Heart is mildly enlarged with pulmonary vascularity normal. No adenopathy. No bone lesions. IMPRESSION: Areas of apparent pneumonia in both lower lung zones as well as in the left upper lobe. Lungs elsewhere are clear. Heart is mildly enlarged. No adenopathy evident. Electronically Signed   By: Lowella Grip III M.D.   On: 07/20/2018 14:47    Procedures .Critical Care Performed by: Volanda Napoleon, PA-C Authorized by: Volanda Napoleon, PA-C   Critical care provider statement:    Critical care time (minutes):  45   Critical care was necessary to treat or prevent imminent or life-threatening deterioration of the following conditions:  Respiratory failure   Critical care was time spent personally by me on the following activities:  Discussions with consultants, evaluation of patient's response to treatment, examination of patient, ordering and performing treatments and interventions, ordering and review of laboratory studies, ordering and review of radiographic studies, pulse oximetry, re-evaluation of patient's condition, obtaining history from patient or surrogate and review of old charts   (including critical care time)  Medications Ordered in ED Medications  vancomycin (VANCOCIN) 1,250 mg in sodium chloride 0.9 % 250 mL IVPB (1,250 mg Intravenous New Bag/Given 07/20/18 1537)  enoxaparin (LOVENOX) injection 40 mg (  has no administration in time range)  sodium chloride flush (NS) 0.9 % injection 3 mL (has no administration in time range)  sodium chloride flush (NS) 0.9 % injection 3 mL (has no administration in time range)  0.9 %  sodium chloride infusion (has no administration in time range)  acetaminophen (TYLENOL) tablet 650 mg (has no administration in time range)    Or  acetaminophen (TYLENOL) suppository 650 mg (has no administration in time range)  ondansetron (ZOFRAN) tablet 4 mg (has no administration in time  range)    Or  ondansetron (ZOFRAN) injection 4 mg (has no administration in time range)  allopurinol (ZYLOPRIM) tablet 300 mg (has no administration in time range)  aspirin EC tablet 81 mg (has no administration in time range)  carvedilol (COREG) tablet 12.5 mg (has no administration in time range)  furosemide (LASIX) tablet 40 mg (has no administration in time range)  pantoprazole (PROTONIX) EC tablet 40 mg (has no administration in time range)  losartan (COZAAR) tablet 25 mg (has no administration in time range)  Basaglar KwikPen KwikPen 20 Units (has no administration in time range)  insulin aspart (novoLOG) injection 0-9 Units (has no administration in time range)  insulin aspart (novoLOG) injection 0-5 Units (has no administration in time range)  acetaminophen (TYLENOL) tablet 1,000 mg (1,000 mg Oral Given 07/20/18 1334)  ceFEPIme (MAXIPIME) 2 g in sodium chloride 0.9 % 100 mL IVPB (0 g Intravenous Stopped 07/20/18 1613)     Initial Impression / Assessment and Plan / ED Course  I have reviewed the triage vital signs and the nursing notes.  Pertinent labs & imaging results that were available during my care of the patient were reviewed by me and considered in my medical decision making (see chart for details).        63 y.o. F with PMH/o, DM, CVA, CHF, who is KNOWN COVID-19 POSITIVE presents for evaluation of worsening SOB. No CP, abdominal pain. On initial ED arrival she is febrile, tachypneic and hypoxic. Per EMS, she was 75% O2 on RA but improved with 10L NRB. On my evaluation, she when NRB is removed, she drops down to 70% O2 RA with significantly increased work of breathing. On NRB, she improves to 98-99%. No signs of fluid overload on exam. Suspect this is complication from her known COVID 19 status vs infectious process. Plan for CXR, EKG, labs. Given concerns for COVID 19, full PPE (scrub cap, N95, isolation gown, Face shield, gloves) was worn while evaluating the patient.    Patient with O2 sats of 100% on 10L high flow nasal canula.   VBG shows ph of 7.391, PCO2 37.2, PO2 30.0. CBC shows no leukocytosis.  Hemoglobin is 9.4.  Review baseline so she is consistently around 10-9.  BMP shows potassium 3.3.  Creatinine is elevated at 2.03, BUN elevated at 41.  Previous creatinine shows that she is consistently between 1.4-1.5.  BNP elevated at 1, 042.9. Trop is elevated at 1.3. Patient with no active CP. Suspect this is related to demand ischemia.   Chest x-ray consistent with pneumonia in lower lungs as well as left upper lobe.  Heart is mildly enlarged.  With concerns for healthcare acquired pneumonia, will plan to start her on broad-spectrum antibiotics.  Additionally, given hypoxia, pneumonia.  Will plan for admission.  Re-evaluation. Patient resting comfortably on High flow oxygen. Able to speak in full sentences. No evidence of respiratory distress.   Discussed patient with Dr. Wynelle Cleveland (hospitalist). Recommend that [place]  be consulted and see the patient in the ED given high oxygen requirement.   Discussed patient with Dr. Lamonte Sakai [facility]). Will come see patient in the ED.   Discussed patient with Dr. Lamonte Sakai after ED evaluation. Patient does not need critical care admission at this time. He has discussed with hospitalist who will admit.   Portions of this note were generated with [employer]. Dictation errors may occur despite best attempts at proofreading.  Kara Hanson was evaluated in Emergency Department on 07/20/2018 for the symptoms described in the history of present illness. She was evaluated in the context of the global COVID-19 pandemic, which necessitated consideration that the patient might be at risk for infection with the SARS-CoV-2 virus that causes COVID-19. Institutional protocols and algorithms that pertain to the evaluation of patients at risk for COVID-19 are in a state of rapid change based on information released by regulatory  bodies including the CDC and federal and state organizations. These policies and algorithms were followed during the patient's care in the ED.  Final Clinical Impressions(s) / ED Diagnoses   Final diagnoses:  HCAP (healthcare-associated pneumonia)  Hypoxia  Suspected Covid-19 Virus Infection    ED Discharge Orders    None       Volanda Napoleon, PA-C 07/20/18 1658    Duffy Bruce, MD 07/21/18 [phone]

## 2018-07-20 NOTE — Progress Notes (Signed)
Pharmacy Brief Note  63 y/o F COVID + admitted with SOB. Actemra and hydroxychloroquine ordered. Baseline QTc 549. Discussed with Dr. Loanne Drilling, will hold hydroxychloroquine.   Ulice Dash, PharmD Clinical Pharmacist

## 2018-07-20 NOTE — ED Notes (Signed)
Called pharmacy to release medication

## 2018-07-20 NOTE — ED Notes (Signed)
Lab called for critical value.  Pt's troponin is 1.3.  Provider made aware.

## 2018-07-20 NOTE — Progress Notes (Signed)
Pharmacy Note   A consult was received from an ED physician for vancomycin per pharmacy dosing.    The patient's profile has been reviewed for ht/wt/allergies/indication/available labs.    A one time order has been placed for vancomycin 1250 mg IV x1 .  Further antibiotics/pharmacy consults should be ordered by admitting physician if indicated.                       Thank you,  Royetta Asal, PharmD, BCPS Pager [phone] 07/20/2018 3:11 PM

## 2018-07-20 NOTE — ED Notes (Signed)
ED TO INPATIENT HANDOFF REPORT  ED Nurse Name and Phone #: Loleta Books [phone]  S Name/Age/Gender Kara Hanson 63 y.o. female Room/Bed: WA18/WA18  Code Status   Code Status: Full Code  Home/SNF/Other Home Patient oriented to: self, place, time and situation Is this baseline? Yes   Triage Complete: Triage complete  Chief Complaint shob;weakness;left shoulder frozen  Triage Note Patient arrived by EMS from doctors office. PT c/o SOB. Pt recently traveled to [place]. PT received a positive COVID-19 test last Thursday (4/9). Pt was previously in ICU for 2 months w/ unknown reasoning per EMS.   Hx of CHF,DM, and spinal stenosis.   EMS VS BP 148/90, SpO2 98% on 10L Non-Rebreather and 75% on RA, CBG 150.   Allergies Allergies  Allergen Reactions  . Garlic Shortness Of Breath, Itching and Swelling    Hand itching and swelling  . Latex Itching  . Morphine And Related Itching and Other (See Comments)    Headache   . Other Itching    Reaction to newspaper ink -itching and headache    Level of Care/Admitting Diagnosis ED Disposition    ED Disposition Condition [place]: [place] [[id number]]  Level of Care: Stepdown [14]  Admit to SDU based on following criteria: Respiratory Distress:  Frequent assessment and/or intervention to maintain adequate ventilation/respiration, pulmonary toilet, and respiratory treatment.  Covid Evaluation: Confirmed COVID Positive  Isolation Risk Level: Low Risk  (Less than 4L [place] supplementation)  Diagnosis: COVID-19 virus infection [[id number]]  Admitting Physician: [place], [place]  Attending Physician: Debbe Odea [[id number]]  Estimated length of stay: past midnight tomorrow  Certification:: I certify this patient will need inpatient services for at least 2 midnights  PT Class (Do Not Modify): Inpatient [101]  PT Acc Code (Do Not Modify): Private [1]       B Medical/Surgery  History Past Medical History:  Diagnosis Date  . CHF (congestive heart failure) ([place])   . CVA (cerebral vascular accident) ([place])   . Diabetes mellitus without complication ([place])   . Hypercholesteremia   . Hypertension   . Myocardial infarction ([place])   . Spinal stenosis    Past Surgical History:  Procedure Laterality Date  . BLADDER SURGERY    . CARDIAC CATHETERIZATION N/A 04/25/2016   Procedure: Left Heart Cath and Coronary Angiography;  Surgeon: Lorretta Harp, MD;  Location: [facility];  Service: Cardiovascular;  Laterality: N/A;  . CESAREAN SECTION    . CHOLECYSTECTOMY       A IV Location/Drains/Wounds Patient Lines/Drains/Airways Status   Active Line/Drains/Airways    Name:   Placement date:   Placement time:   Site:   Days:   Peripheral IV 07/20/18 Left Antecubital   07/20/18    1351    Antecubital   less than 1   Peripheral IV 07/20/18 Right Antecubital   07/20/18    1403    Antecubital   less than 1          Intake/Output Last 24 hours  Intake/Output Summary (Last 24 hours) at 07/20/2018 2314 Last data filed at 07/20/2018 1854 Gross per 24 hour  Intake 350 ml  Output -  Net 350 ml    Labs/Imaging Results for orders placed or performed during the hospital encounter of 07/20/18 (from the past 48 hour(s))  Brain natriuretic peptide     Status: Abnormal   Collection Time: 07/20/18  1:40 PM  Result Value Ref Range  B Natriuretic Peptide 1,042.9 (H) 0.0 - 100.0 pg/mL    Comment: Performed at [facility], [place] [address], [place], [place] [address]  Lactic acid, plasma     Status: None   Collection Time: 07/20/18  1:40 PM  Result Value Ref Range   Lactic Acid, Venous 1.2 0.5 - 1.9 mmol/L    Comment: Performed at [facility], [place] [address], [place], [place] [address]  Comprehensive metabolic panel     Status: Abnormal   Collection Time: 07/20/18  1:40 PM  Result Value Ref Range   Sodium 132 (L) 135 - 145 mmol/L    Potassium 3.3 (L) 3.5 - 5.1 mmol/L   Chloride 98 98 - 111 mmol/L   CO2 20 (L) 22 - 32 mmol/L   Glucose, Bld 189 (H) 70 - 99 mg/dL   BUN 41 (H) 8 - 23 mg/dL   Creatinine, Ser 2.03 (H) 0.44 - 1.00 mg/dL   Calcium 8.5 (L) 8.9 - 10.3 mg/dL   Total Protein 6.5 6.5 - 8.1 g/dL   Albumin 2.7 (L) 3.5 - 5.0 g/dL   AST 30 15 - 41 U/L   ALT 22 0 - 44 U/L   Alkaline Phosphatase 81 38 - 126 U/L   Total Bilirubin 0.5 0.3 - 1.2 mg/dL   GFR calc non Af Amer 25 (L) >60 mL/min   GFR calc Af Amer 29 (L) >60 mL/min   Anion gap 14 5 - 15    Comment: Performed at [facility], [place] [address], [place], [place] [address]  CBC with Differential     Status: Abnormal   Collection Time: 07/20/18  1:40 PM  Result Value Ref Range   WBC 7.9 4.0 - 10.5 K/uL   RBC 3.37 (L) 3.87 - 5.11 MIL/uL   Hemoglobin 9.4 (L) 12.0 - 15.0 g/dL   HCT 30.1 (L) 36.0 - 46.0 %   MCV 89.3 80.0 - 100.0 fL   MCH 27.9 26.0 - 34.0 pg   MCHC 31.2 30.0 - 36.0 g/dL   RDW 17.5 (H) 11.5 - 15.5 %   Platelets 316 150 - 400 K/uL   nRBC 0.0 0.0 - 0.2 %   Neutrophils Relative % 81 %   Neutro Abs 6.4 1.7 - 7.7 K/uL   Lymphocytes Relative 11 %   Lymphs Abs 0.9 0.7 - 4.0 K/uL   Monocytes Relative 8 %   Monocytes Absolute 0.6 0.1 - 1.0 K/uL   Eosinophils Relative 0 %   Eosinophils Absolute 0.0 0.0 - 0.5 K/uL   Basophils Relative 0 %   Basophils Absolute 0.0 0.0 - 0.1 K/uL   Immature Granulocytes 0 %   Abs Immature Granulocytes 0.03 0.00 - 0.07 K/uL    Comment: Performed at [facility], [place] [address] [place], [place] [address]  Troponin I - ONCE - STAT     Status: Abnormal   Collection Time: 07/20/18  1:40 PM  Result Value Ref Range   Troponin I 1.30 (HH) <0.03 ng/mL    Comment: CRITICAL RESULT CALLED TO, READ BACK BY AND VERIFIED WITH: GRIFFIN,M. RN @1452  ON 04.17.2020 BY COHEN,K Performed at [facility], [place] [address], [place], [place] [address]   POC CBG, ED     Status:  Abnormal   Collection Time: 07/20/18  1:45 PM  Result Value Ref Range   Glucose-Capillary 147 (H) 70 - 99 mg/dL  Blood gas, venous (at [facility] and [facility])  Status: Abnormal   Collection Time: 07/20/18  1:52 PM  Result Value Ref Range   FIO2 100.00    Delivery systems NRB MASK    pH, Ven 7.376 7.250 - 7.430   pCO2, Ven 42.0 (L) 44.0 - 60.0 mmHg   pO2, Ven 31.4 (LL) 32.0 - 45.0 mmHg    Comment: RBV LINDSEY LAYDEN, PA AT 1407 BY KENNAN DEPUE,RRT,RCP ON 07/20/2018   Bicarbonate 24.0 20.0 - 28.0 mmol/L   Acid-base deficit 0.7 0.0 - 2.0 mmol/L   O2 Saturation 49.3 %   Patient temperature 98.6    Collection site VEIN    Drawn by RN    Sample type VEIN     Comment: Performed at [place] [address], [place], [place] [address]  POCT I-Stat EG7     Status: Abnormal   Collection Time: 07/20/18  1:57 PM  Result Value Ref Range   pH, Ven 7.391 7.250 - 7.430   pCO2, Ven 37.2 (L) 44.0 - 60.0 mmHg   pO2, Ven 30.0 (LL) 32.0 - 45.0 mmHg   Bicarbonate 22.6 20.0 - 28.0 mmol/L   TCO2 24 22 - 32 mmol/L   O2 Saturation 58.0 %   Acid-base deficit 2.0 0.0 - 2.0 mmol/L   Sodium 131 (L) 135 - 145 mmol/L   Potassium 3.4 (L) 3.5 - 5.1 mmol/L   Calcium, Ion 1.14 (L) 1.15 - 1.40 mmol/L   HCT 28.0 (L) 36.0 - 46.0 %   Hemoglobin 9.5 (L) 12.0 - 15.0 g/dL   Patient temperature HIDE    Sample type VENOUS    Comment NOTIFIED PHYSICIAN   CBC     Status: Abnormal   Collection Time: 07/20/18  6:03 PM  Result Value Ref Range   WBC 6.7 4.0 - 10.5 K/uL   RBC 3.22 (L) 3.87 - 5.11 MIL/uL   Hemoglobin 8.7 (L) 12.0 - 15.0 g/dL   HCT 29.1 (L) 36.0 - 46.0 %   MCV 90.4 80.0 - 100.0 fL   MCH 27.0 26.0 - 34.0 pg   MCHC 29.9 (L) 30.0 - 36.0 g/dL   RDW 17.5 (H) 11.5 - 15.5 %   Platelets 287 150 - 400 K/uL   nRBC 0.0 0.0 - 0.2 %    Comment: Performed at [facility], [place] [address] [place], [place] [address]  Creatinine, serum     Status: Abnormal   Collection Time: 07/20/18   6:03 PM  Result Value Ref Range   Creatinine, Ser 2.01 (H) 0.44 - 1.00 mg/dL   GFR calc non Af Amer 26 (L) >60 mL/min   GFR calc Af Amer 30 (L) >60 mL/min    Comment: Performed at [facility], [place] [address], [place], [place] [address]  D-dimer, quantitative (not at [facility])     Status: Abnormal   Collection Time: 07/20/18  6:03 PM  Result Value Ref Range   D-Dimer, Quant 2.72 (H) 0.00 - 0.50 ug/mL-FEU    Comment: (NOTE) At the manufacturer cut-off of 0.50 ug/mL FEU, this assay has been documented to exclude PE with a sensitivity and negative predictive value of 97 to 99%.  At this time, this assay has not been approved by the FDA to exclude DVT/VTE. Results should be correlated with clinical presentation. Performed at [facility], [place] [address] [place], [place] [address]   Ferritin     Status: Abnormal   Collection Time: 07/20/18  6:03 PM  Result Value Ref Range  Ferritin 398 (H) 11 - 307 ng/mL    Comment: Performed at [facility], [place] [address] [place], [place] [address]  Fibrinogen     Status: Abnormal   Collection Time: 07/20/18  6:03 PM  Result Value Ref Range   Fibrinogen >800 (H) 210 - 475 mg/dL    Comment: Performed at [facility], [place] [address] [place], [place] [address]  Lactate dehydrogenase     Status: Abnormal   Collection Time: 07/20/18  6:03 PM  Result Value Ref Range   LDH 206 (H) 98 - 192 U/L    Comment: Performed at [facility], [place] [address] [place], [place] [address]  Procalcitonin     Status: None   Collection Time: 07/20/18  6:03 PM  Result Value Ref Range   Procalcitonin 1.02 ng/mL    Comment:        Interpretation: PCT > 0.5 ng/mL and <= 2 ng/mL: Systemic infection (sepsis) is possible, but other conditions are known to elevate PCT as well. (NOTE)       Sepsis PCT Algorithm           Lower Respiratory Tract                                       Infection PCT Algorithm    ----------------------------     ----------------------------         PCT < 0.25 ng/mL                PCT < 0.10 ng/mL         Strongly encourage             Strongly discourage   discontinuation of antibiotics    initiation of antibiotics    ----------------------------     -----------------------------       PCT 0.25 - 0.50 ng/mL            PCT 0.10 - 0.25 ng/mL               OR       >80% decrease in PCT            Discourage initiation of                                            antibiotics      Encourage discontinuation           of antibiotics    ----------------------------     -----------------------------         PCT >= 0.50 ng/mL              PCT 0.26 - 0.50 ng/mL                AND       <80% decrease in PCT             Encourage initiation of                                             antibiotics       Encourage continuation  of antibiotics    ----------------------------     -----------------------------        PCT >= 0.50 ng/mL                  PCT > 0.50 ng/mL               AND         increase in PCT                  Strongly encourage                                      initiation of antibiotics    Strongly encourage escalation           of antibiotics                                     -----------------------------                                           PCT <= 0.25 ng/mL                                                 OR                                        > 80% decrease in PCT                                     Discontinue / Do not initiate                                             antibiotics Performed at [place] [address] [place], [place] [address]   Sedimentation rate     Status: Abnormal   Collection Time: 07/20/18  6:03 PM  Result Value Ref Range   Sed Rate 135 (H) 0 - 22 mm/hr    Comment: Performed at [facility], [place] [address] [place], [place]  [address]  Triglycerides     Status: None   Collection Time: 07/20/18  6:03 PM  Result Value Ref Range   Triglycerides 137 <150 mg/dL    Comment: Performed at [facility], [place] [address] [place], [place] [address]  Troponin I - Now Then Q6H     Status: Abnormal   Collection Time: 07/20/18  6:03 PM  Result Value Ref Range   Troponin I 1.27 (HH) <0.03 ng/mL    Comment: CRITICAL VALUE NOTED.  VALUE IS CONSISTENT WITH PREVIOUSLY REPORTED AND CALLED VALUE. Performed at [facility], [place] [address], [place], [place] [address]   CBG monitoring, ED     Status: Abnormal   Collection Time: 07/20/18  6:10 PM  Result Value Ref Range   Glucose-Capillary 162 (H) 70 - 99 mg/dL  CBG monitoring, ED     Status: Abnormal   Collection Time: 07/20/18  8:55 PM  Result Value Ref Range   Glucose-Capillary 168 (H) 70 - 99 mg/dL   Comment 1 Notify RN    Dg Chest Portable 1 View  Result Date: 07/20/2018 CLINICAL DATA:  Shortness of breath.  Recent positive COVID-19 test EXAM: PORTABLE CHEST 1 VIEW COMPARISON:  March 20, 2018 FINDINGS: There are areas of airspace consolidation in both lung bases as well as in the left upper lobe. Heart is mildly enlarged with pulmonary vascularity normal. No adenopathy. No bone lesions. IMPRESSION: Areas of apparent pneumonia in both lower lung zones as well as in the left upper lobe. Lungs elsewhere are clear. Heart is mildly enlarged. No adenopathy evident. Electronically Signed   By: Lowella Grip III M.D.   On: 07/20/2018 14:47    Pending Labs Unresulted Labs (From admission, onward)    Start     Ordered   07/27/18 0500  Creatinine, serum  (enoxaparin (LOVENOX)    CrCl >/= 30 ml/min)  Weekly,   R    Comments:  while on enoxaparin therapy    07/20/18 1655   07/21/18 0500  CBC  Tomorrow morning,   R     07/20/18 1655   07/21/18 0500  Comprehensive metabolic panel  Tomorrow morning,   R     07/20/18 1655   07/21/18 0500  CBC  with Differential/Platelet  Daily,   R     07/20/18 1703   07/21/18 0500  Comprehensive metabolic panel  Daily,   R     07/20/18 1703   07/21/18 0500  C-reactive protein  Daily,   R     07/20/18 1703   07/21/18 0500  D-dimer, quantitative (not at [facility])  Daily,   R     07/20/18 1703   07/20/18 1734  Troponin I - Now Then Q6H  Now then every 6 hours,   R     07/20/18 1734   07/20/18 1701  Glucose 6 phosphate dehydrogenase  Once,   R     07/20/18 1703   07/20/18 1701  Hepatitis B surface antigen  Once,   R     07/20/18 1703   07/20/18 1701  Interleukin-6, Plasma  Once,   R     07/20/18 1703   07/20/18 1340  Culture, blood (routine x 2)  BLOOD CULTURE X 2,   STAT     07/20/18 1340          Vitals/Pain Today's Vitals   07/20/18 2215 07/20/18 2230 07/20/18 2245 07/20/18 2300  BP: (!) 116/52 (!) 124/56 (!) 121/55 (!) 113/54  Pulse: 66 71 69 73  Resp: (!) 32 (!) 24 (!) 22 19  Temp:      TempSrc:      SpO2: 97% 97% 98% 98%  Weight:      Height:      PainSc:        Isolation Precautions Droplet and Contact precautions  Medications Medications  enoxaparin (LOVENOX) injection 40 mg (has no administration in time range)  sodium chloride flush (NS) 0.9 % injection 3 mL (3 mLs Intravenous Given 07/20/18 2308)  sodium chloride flush (NS) 0.9 % injection 3 mL (has no administration in time range)  0.9 %  sodium chloride infusion (has no administration in time range)  acetaminophen (TYLENOL) tablet 650 mg (has no administration  in time range)    Or  acetaminophen (TYLENOL) suppository 650 mg (has no administration in time range)  ondansetron (ZOFRAN) tablet 4 mg (has no administration in time range)    Or  ondansetron (ZOFRAN) injection 4 mg (has no administration in time range)  allopurinol (ZYLOPRIM) tablet 300 mg (has no administration in time range)  pantoprazole (PROTONIX) EC tablet 40 mg (has no administration in time range)  insulin glargine (LANTUS) injection 20 Units (20  Units Subcutaneous Given 07/20/18 2306)  insulin aspart (novoLOG) injection 0-9 Units (has no administration in time range)  insulin aspart (novoLOG) injection 0-5 Units (0 Units Subcutaneous Hold 07/20/18 2105)  tocilizumab (ACTEMRA) 8 mg/kg = 544 mg in sodium chloride 0.9 % 100 mL infusion (has no administration in time range)  aspirin EC tablet 325 mg (325 mg Oral Given 07/20/18 2307)  dextrose 5 % and 0.9 % NaCl with KCl 40 mEq/L infusion (0 mL/hr Intravenous Paused 07/20/18 2311)  acetaminophen (TYLENOL) tablet 1,000 mg (1,000 mg Oral Given 07/20/18 1334)  ceFEPIme (MAXIPIME) 2 g in sodium chloride 0.9 % 100 mL IVPB (0 g Intravenous Stopped 07/20/18 1613)    Mobility non-ambulatory Moderate fall risk   Focused Assessments Pulmonary Assessment Handoff:  Lung sounds: Bilateral Breath Sounds: Coarse crackles L Breath Sounds: Coarse crackles R Breath Sounds: Coarse crackles O2 Device: Room Air O2 Flow Rate (L/min): 12 L/min      R Recommendations: See Admitting Provider Note  Report given to: Kayla RN  Additional Notes:

## 2018-07-20 NOTE — ED Notes (Signed)
EKG was given to Dr.Issacs.

## 2018-07-20 NOTE — H&P (Signed)
History and Physical    Kara Hanson  JSH:[id number]  DOB: [birth year]  DOA: 07/20/2018 PCP: [name], MD   Patient coming from: SNF  Chief Complaint: shortness of breath  HPI: Kara Hanson is a 63 y.o. female with medical history of DM2, diastolic CHF, HTN. She was recently in [place] where she was in the ICU for about 2 months and subsequently when to a rehab facility.Prior to being discharged from [facility], she tested positive for COVID 19 on 07/12/18.  The patient is sleepy and it is difficult to obtain a history.  She presents from a doctor's office with dyspnea and initially was on a non- rebreather. She tells me that she has a cough and then falls asleep. Cannot obtain further history from her.   ED Course: fever 100.9Currently is on 3 L o2 with pulse ox in 90s.   CXR > Areas of apparent pneumonia in both lower lung zones as well as in the left upper lobe.  Review of Systems:  - unable to obtain    Past Medical History:  Diagnosis Date  . CHF (congestive heart failure) ([place])   . CVA (cerebral vascular accident) ([place])   . Diabetes mellitus without complication ([place])   . Hypercholesteremia   . Hypertension   . Myocardial infarction ([place])   . Spinal stenosis     Past Surgical History:  Procedure Laterality Date  . BLADDER SURGERY    . CARDIAC CATHETERIZATION N/A 04/25/2016   Procedure: Left Heart Cath and Coronary Angiography;  Surgeon: Lorretta Harp, MD;  Location: [facility];  Service: Cardiovascular;  Laterality: N/A;  . CESAREAN SECTION    . CHOLECYSTECTOMY      Social History:   reports that she has never smoked. She has never used smokeless tobacco. She reports that she does not drink alcohol or use drugs.  Allergies  Allergen Reactions  . Garlic Shortness Of Breath, Itching and Swelling    Hand itching and swelling  . Latex Itching  . Morphine And Related Itching and Other (See Comments)    Headache   . Other Itching    Reaction to  newspaper ink -itching and headache    Family History  Problem Relation Age of Onset  . Diabetes Mellitus II Father   . Stroke Father      Prior to Admission medications   Medication Sig Start Date End Date Taking? Authorizing Provider  allopurinol (ZYLOPRIM) 300 MG tablet TAKE 1 TABLET (300 MG TOTAL) BY MOUTH DAILY. 03/20/18  Yes [name], MD  aspirin EC 81 MG tablet Take 81 mg by mouth daily.   Yes [provider]  Blood Glucose Monitoring Suppl (ONE TOUCH ULTRA MINI) w/Device KIT UAD UP TO THREE TIMES DAILY DX E11.4 11/20/17  Yes [name], MD  carvedilol (COREG) 12.5 MG tablet Take 1 tablet (12.5 mg total) by mouth 2 (two) times daily with a meal. 03/29/18  Yes Newlin, Enobong, MD  ergocalciferol (DRISDOL) 1.25 MG (50000 UT) capsule Take 1 capsule (50,000 Units total) by mouth once a week. Patient taking differently: Take 50,000 Units by mouth once a week. Takes on Tuesdays 04/02/18  Yes [name], MD  furosemide (LASIX) 40 MG tablet Take 1 tablet (40 mg total) by mouth 2 (two) times daily. 03/24/18  Yes Elodia Florence., MD  glucose blood (ONE TOUCH ULTRA TEST) test strip UAD UP TO THREE TIMES DAILY DX E11.4 03/29/18  Yes [name], MD  insulin aspart (NOVOLOG) 100  UNIT/ML injection 0 to 12 units subcutaneously 3 times daily before meals as per sliding scale 03/29/18  Yes Newlin, Enobong, MD  Insulin Glargine (BASAGLAR KWIKPEN) 100 UNIT/ML SOPN Inject 0.35 mLs (35 Units total) into the skin at bedtime. 03/29/18  Yes Newlin, Charlane Ferretti, MD  Insulin Pen Needle 31G X 5 MM MISC 1 each by Does not apply route at bedtime. 12/13/17  Yes [name], MD  Lancet Device MISC Use as instructed 3 times daily 09/19/17  Yes Fawze, Mina A, PA-C  losartan (COZAAR) 25 MG tablet Take 25 mg by mouth daily.   Yes [provider]  omeprazole (PRILOSEC) 20 MG capsule Take 20 mg by mouth daily.   Yes [provider]  [facility] DELICA LANCETS 01K MISC UAD  UP TO THREE TIMES DAILY DX E11.4 11/20/17  Yes [name], MD  SUPER B COMPLEX/C CAPS Take 1 capsule by mouth daily.   Yes [provider]  tiZANidine (ZANAFLEX) 4 MG tablet Take 1 tablet (4 mg total) by mouth every 8 (eight) hours as needed for muscle spasms. 03/29/18  Yes [name], MD  amLODipine (NORVASC) 5 MG tablet Take 1 tablet (5 mg total) by mouth at bedtime. 03/29/18 04/28/18  [name], MD  atorvastatin (LIPITOR) 80 MG tablet Take 1 tablet (80 mg total) by mouth daily at 6 PM. 03/24/18 04/23/18  Elodia Florence., MD  dicyclomine (BENTYL) 20 MG tablet Take 1 tablet (20 mg total) by mouth 2 (two) times daily. Patient not taking: Reported on 03/20/2018 01/02/18   Maudie Flakes, MD  ezetimibe (ZETIA) 10 MG tablet Take 1 tablet (10 mg total) by mouth daily. 03/25/18 04/24/18  Elodia Florence., MD  gabapentin (NEURONTIN) 300 MG capsule Take 1 capsule (300 mg total) by mouth 2 (two) times daily. 03/29/18 04/28/18  [name], MD  lansoprazole (PREVACID) 15 MG capsule Take 1 capsule (15 mg total) by mouth daily. Patient not taking: Reported on 07/20/2018 03/29/18   [name], MD  metoCLOPramide (REGLAN) 5 MG tablet Take 1 tablet (5 mg total) by mouth 3 (three) times daily before meals. Patient not taking: Reported on 03/20/2018 11/14/17   [name], MD    Physical Exam: Wt Readings from Last 3 Encounters:  07/20/18 68 kg  03/29/18 78.8 kg  03/24/18 77.3 kg   Vitals:   07/20/18 1430 07/20/18 1515 07/20/18 1600 07/20/18 1657  BP: (!) 114/91 (!) 116/56 (!) 127/59 (!) 105/56  Pulse: 73 70 70 67  Resp: (!) 27 (!) _0 Temp:      TempSrc:      SpO2: 100% 100% 100% 95%  Weight:      Height:          Constitutional:  Calm & comfortable Eyes: PERRLA, lids and conjunctivae normal ENT:  Mucous membranes are moist.  Pharynx clear of exudate   Normal dentition.  Neck: Supple, no masses  Respiratory:  Clear to auscultation  bilaterally  Normal respiratory effort.  Cardiovascular:  S1 & S2 heard, regular rate and rhythm No Murmurs Abdomen:  Non distended No tenderness, No masses Bowel sounds normal Extremities:  No clubbing / cyanosis No pedal edema No joint deformity    Skin:  No rashes, lesions or ulcers Neurologic: CN 2-12 grossly intact- cannot assess further  Psychiatric:  Difficult to assess    Labs on Admission: I have personally reviewed following labs and imaging studies  CBC: Recent Labs  Lab 07/20/18 1340 07/20/18 1357  WBC 7.9  --   NEUTROABS 6.4  --   HGB 9.4* 9.5*  HCT 30.1* 28.0*  MCV 89.3  --   PLT 316  --    Basic Metabolic Panel: Recent Labs  Lab 07/20/18 1340 07/20/18 1357  NA 132* 131*  K 3.3* 3.4*  CL 98  --   CO2 20*  --   GLUCOSE 189*  --   BUN 41*  --   CREATININE 2.03*  --   CALCIUM 8.5*  --    GFR: Estimated Creatinine Clearance: 24.4 mL/min (A) (by C-G formula based on SCr of 2.03 mg/dL (H)). Liver Function Tests: Recent Labs  Lab 07/20/18 1340  AST 30  ALT 22  ALKPHOS 81  BILITOT 0.5  PROT 6.5  ALBUMIN 2.7*   No results for input(s): LIPASE, AMYLASE in the last 168 hours. No results for input(s): AMMONIA in the last 168 hours. Coagulation Profile: No results for input(s): INR, PROTIME in the last 168 hours. Cardiac Enzymes: Recent Labs  Lab 07/20/18 1340  TROPONINI 1.30*   BNP (last 3 results) No results for input(s): PROBNP in the last 8760 hours. HbA1C: No results for input(s): HGBA1C in the last 72 hours. CBG: Recent Labs  Lab 07/20/18 1345  GLUCAP 147*   Lipid Profile: No results for input(s): CHOL, HDL, LDLCALC, TRIG, CHOLHDL, LDLDIRECT in the last 72 hours. Thyroid Function Tests: No results for input(s): TSH, T4TOTAL, FREET4, T3FREE, THYROIDAB in the last 72 hours. Anemia Panel: No results for input(s): VITAMINB12, FOLATE, FERRITIN, TIBC, IRON, RETICCTPCT in the last 72 hours. Urine analysis:    Component Value  Date/Time   COLORURINE YELLOW 01/02/2018 1452   APPEARANCEUR HAZY (A) 01/02/2018 1452   LABSPEC 1.039 (H) 01/02/2018 1452   PHURINE 6.0 01/02/2018 1452   GLUCOSEU >=500 (A) 01/02/2018 1452   HGBUR NEGATIVE 01/02/2018 1452   BILIRUBINUR NEGATIVE 01/02/2018 1452   BILIRUBINUR negative 12/13/2017 1514   KETONESUR NEGATIVE 01/02/2018 1452   PROTEINUR >=300 (A) 01/02/2018 1452   UROBILINOGEN 0.2 12/13/2017 1514   NITRITE NEGATIVE 01/02/2018 1452   LEUKOCYTESUR NEGATIVE 01/02/2018 1452   Sepsis Labs: _0 (procalcitonin:4,lacticidven:4) )No results found for this or any previous visit (from the past 240 hour(s)).   Radiological Exams on Admission: Dg Chest Portable 1 View  Result Date: 07/20/2018 CLINICAL DATA:  Shortness of breath.  Recent positive COVID-19 test EXAM: PORTABLE CHEST 1 VIEW COMPARISON:  March 20, 2018 FINDINGS: There are areas of airspace consolidation in both lung bases as well as in the left upper lobe. Heart is mildly enlarged with pulmonary vascularity normal. No adenopathy. No bone lesions. IMPRESSION: Areas of apparent pneumonia in both lower lung zones as well as in the left upper lobe. Lungs elsewhere are clear. Heart is mildly enlarged. No adenopathy evident. Electronically Signed   By: Lowella Grip III M.D.   On: 07/20/2018 14:47    EKG: Independently reviewed. Prolonged QTc - 549 msec  Assessment/Plan Principal Problem:   Pneumonia due to COVID-19 virus - cont supplemental O2 - start Hydroxycholorquine and Tocilizomab - she received Vanc and Cefepime in the ED but I will not continue these- she has already received a course of Levaquin - f/u on COVID related labs  Active Problems:   AKI- h/o CKD 3 - baseline Cr ~ 1.4 - give IVF, D5 NS with K+ at 75 cc/hr - hold Lasix and ARB  Elevated troponin - likely due to acute respiratory infection but may have CAD/ NSTEMI - start full  dose ASA 325 mg and trend troponin - order ECHO    Diabetes  mellitus type 2 with complications, uncontrolled - cut back on home Lantus to 20 U and add Novolog SSI with meals    Gout - Allopurinol     Chronic diastolic CHF grade 2 (congestive heart failure)   - dehydrated- holding Lasix for now   DVT prophylaxis: Lovenox Code Status: Full code  Family Communication:   Disposition Plan: admit to [facility] called: PCCM  Admission status: inpatient    Debbe Odea MD [facility] Pager: [email or web address] Password [email or web address] 7PM-7AM, please contact night-coverage   07/20/2018, 5:25 PM

## 2018-07-21 ENCOUNTER — Inpatient Hospital Stay (HOSPITAL_COMMUNITY): Payer: Medicare Other

## 2018-07-21 DIAGNOSIS — J1289 Other viral pneumonia: Secondary | ICD-10-CM

## 2018-07-21 LAB — CBC WITH DIFFERENTIAL/PLATELET
Abs Immature Granulocytes: 0.04 10*3/uL (ref 0.00–0.07)
Basophils Absolute: 0 10*3/uL (ref 0.0–0.1)
Basophils Relative: 0 %
Eosinophils Absolute: 0 10*3/uL (ref 0.0–0.5)
Eosinophils Relative: 0 %
HCT: 26.5 % — ABNORMAL LOW (ref 36.0–46.0)
Hemoglobin: 8.2 g/dL — ABNORMAL LOW (ref 12.0–15.0)
Immature Granulocytes: 1 %
Lymphocytes Relative: 8 %
Lymphs Abs: 0.5 10*3/uL — ABNORMAL LOW (ref 0.7–4.0)
MCH: 28.1 pg (ref 26.0–34.0)
MCHC: 30.9 g/dL (ref 30.0–36.0)
MCV: 90.8 fL (ref 80.0–100.0)
Monocytes Absolute: 0.4 10*3/uL (ref 0.1–1.0)
Monocytes Relative: 6 %
Neutro Abs: 6 10*3/uL (ref 1.7–7.7)
Neutrophils Relative %: 85 %
Platelets: 268 10*3/uL (ref 150–400)
RBC: 2.92 MIL/uL — ABNORMAL LOW (ref 3.87–5.11)
RDW: 17.6 % — ABNORMAL HIGH (ref 11.5–15.5)
WBC: 7 10*3/uL (ref 4.0–10.5)
nRBC: 0 % (ref 0.0–0.2)

## 2018-07-21 LAB — COMPREHENSIVE METABOLIC PANEL
ALT: 19 U/L (ref 0–44)
AST: 25 U/L (ref 15–41)
Albumin: 2.2 g/dL — ABNORMAL LOW (ref 3.5–5.0)
Alkaline Phosphatase: 65 U/L (ref 38–126)
Anion gap: 10 (ref 5–15)
BUN: 48 mg/dL — ABNORMAL HIGH (ref 8–23)
CO2: 22 mmol/L (ref 22–32)
Calcium: 8.3 mg/dL — ABNORMAL LOW (ref 8.9–10.3)
Chloride: 102 mmol/L (ref 98–111)
Creatinine, Ser: 2.06 mg/dL — ABNORMAL HIGH (ref 0.44–1.00)
GFR calc Af Amer: 29 mL/min — ABNORMAL LOW (ref >60)
GFR calc non Af Amer: 25 mL/min — ABNORMAL LOW (ref >60)
Glucose, Bld: 168 mg/dL — ABNORMAL HIGH (ref 70–99)
Potassium: 3.7 mmol/L (ref 3.5–5.1)
Sodium: 134 mmol/L — ABNORMAL LOW (ref 135–145)
Total Bilirubin: 0.4 mg/dL (ref 0.3–1.2)
Total Protein: 5.7 g/dL — ABNORMAL LOW (ref 6.5–8.1)

## 2018-07-21 LAB — GLUCOSE 6 PHOSPHATE DEHYDROGENASE
G6PDH: 14.2 U/g{Hb} — ABNORMAL HIGH (ref 4.6–13.5)
Hemoglobin: 8 g/dL — ABNORMAL LOW (ref 11.1–15.9)

## 2018-07-21 LAB — GLUCOSE, CAPILLARY
Glucose-Capillary: 155 mg/dL — ABNORMAL HIGH (ref 70–99)
Glucose-Capillary: 172 mg/dL — ABNORMAL HIGH (ref 70–99)
Glucose-Capillary: 175 mg/dL — ABNORMAL HIGH (ref 70–99)
Glucose-Capillary: 177 mg/dL — ABNORMAL HIGH (ref 70–99)

## 2018-07-21 LAB — HEPATITIS B SURFACE ANTIGEN: Hepatitis B Surface Ag: NEGATIVE

## 2018-07-21 LAB — TROPONIN I
Troponin I: 0.83 ng/mL (ref <0.03)
Troponin I: 0.7 ng/mL (ref <0.03)

## 2018-07-21 LAB — INTERLEUKIN-6, PLASMA: Interleukin-6, Plasma: 42 pg/mL — ABNORMAL HIGH (ref 0.0–12.2)

## 2018-07-21 LAB — D-DIMER, QUANTITATIVE: D-Dimer, Quant: 2.56 ug/mL-FEU — ABNORMAL HIGH (ref 0.00–0.50)

## 2018-07-21 LAB — MRSA PCR SCREENING: MRSA by PCR: NEGATIVE

## 2018-07-21 LAB — C-REACTIVE PROTEIN: CRP: 17.7 mg/dL — ABNORMAL HIGH (ref <1.0)

## 2018-07-21 MED ORDER — ENOXAPARIN SODIUM 30 MG/0.3ML ~~LOC~~ SOLN
30.0000 mg | Freq: Every day | SUBCUTANEOUS | Status: DC
  Administered 2018-07-22 – 2018-07-23 (×2): 30 mg via SUBCUTANEOUS
  Filled 2018-07-21 (×2): qty 0.3

## 2018-07-21 MED ORDER — FUROSEMIDE 40 MG PO TABS
40.0000 mg | ORAL_TABLET | Freq: Two times a day (BID) | ORAL | Status: DC
  Filled 2018-07-21 (×4): qty 1

## 2018-07-21 MED ORDER — FUROSEMIDE 10 MG/ML IJ SOLN
20.0000 mg | Freq: Two times a day (BID) | INTRAMUSCULAR | Status: DC
  Administered 2018-07-21: 20 mg via INTRAVENOUS
  Filled 2018-07-21 (×2): qty 2

## 2018-07-21 NOTE — Progress Notes (Signed)
Called and left message for patient's daughter that she has been moved to the [facility] in the progressive care unit.

## 2018-07-21 NOTE — TOC Initial Note (Signed)
Transition of Care [facility]) - Initial/Assessment Note    Patient Details  Name: Kara Hanson MRN: [id number] Date of Birth: February 04, 1956  Transition of Care [facility]) CM/SW Contact:    Ninfa Meeker, RN Phone Number: 07/21/2018, 3:48 PM  Clinical Narrative:        63 y.o. femalewith medical history ofDM2, diastolic CHF, HTN. Patient presented secondary to shortness of breath in setting of positive COVID-19 test.                   Patient Goals and CMS Choice        Expected Discharge Plan and Services   To be determined after PT/OT eval. Case Manager will continue to follow for dispositon.       Expected Discharge Date: (unknown)                        Prior Living Arrangements/Services                       Activities of Daily Living Home Assistive Devices/Equipment: CBG Meter, Dentures (specify type), Walker (specify type)(upper denture (has lower denture but it does not fit), front wheeled walker) ADL Screening (condition at time of admission) Patient's cognitive ability adequate to safely complete daily activities?: Yes Is the patient deaf or have difficulty hearing?: No Does the patient have difficulty seeing, even when wearing glasses/contacts?: No Does the patient have difficulty concentrating, remembering, or making decisions?: No Patient able to express need for assistance with ADLs?: Yes Does the patient have difficulty dressing or bathing?: No Independently performs ADLs?: Yes (appropriate for developmental age) Does the patient have difficulty walking or climbing stairs?: Yes(secondary to shortness of breath) Weakness of Legs: Both Weakness of Arms/Hands: Both  Permission Sought/Granted                  Emotional Assessment              Admission diagnosis:  Hypoxia [R09.02] HCAP (healthcare-associated pneumonia) [J18.9] Suspected Covid-19 Virus Infection [R68.89] COVID-19 virus infection [U07.1] Patient Active Problem List   Diagnosis Date Noted  . COVID-19 virus infection 07/20/2018  . Chronic diastolic CHF (congestive heart failure) ([place]) 07/20/2018  . Pneumonia due to COVID-19 virus 07/20/2018  . Vitamin D deficiency 04/02/2018  . Spinal stenosis 03/29/2018  . Acute and chronic respiratory failure with hypoxia ([place]) 03/20/2018  . Diabetes mellitus type 2 with complications, uncontrolled ([place]) 03/20/2018  . CKD (chronic kidney disease), stage III ([place]) 03/20/2018  . Acute on chronic diastolic CHF (congestive heart failure) ([place]) 11/27/2017  . Acute kidney injury ([place]) 11/27/2017  . Acute respiratory distress 11/27/2017  . Bulging lumbar disc 11/14/2017  . Gout 11/14/2017  . HLD (hyperlipidemia) 09/30/2016  . Chest pain 06/17/2016  . Stress-induced cardiomyopathy 05/19/2016  . Type 2 diabetes mellitus with diabetic neuropathy, unspecified ([place]) 05/06/2016  . Vertigo 05/06/2016  . Controlled type 2 diabetes mellitus with hyperglycemia ([place]) 04/23/2016  . Hypertension 04/23/2016  . Normochromic normocytic anemia 04/23/2016  . Syncope 04/23/2016   PCP:  [name], MD Pharmacy:   [place], [place] - [address] [place] [address] Phone: [phone] Fax: [phone]     Social Determinants of Health (SDOH) Interventions    Readmission Risk Interventions No flowsheet data found.

## 2018-07-21 NOTE — Progress Notes (Addendum)
PROGRESS NOTE    Kara Hanson  QTM:[id number] DOB: 1955/11/06 DOA: 07/20/2018 PCP: [name], MD   Brief Narrative: Kara Hanson is a 63 y.o. female with medical history of DM2, diastolic CHF, HTN. Patient presented secondary to shortness of breath in setting of positive COVID-19 test. She was started on Plaquenil and oxygen therapy. Critical care consulted.   Assessment & Plan:   Principal Problem:   Pneumonia due to COVID-19 virus Active Problems:   Hypertension   Gout   Diabetes mellitus type 2 with complications, uncontrolled ([place])   COVID-19 virus infection   Chronic diastolic CHF (congestive heart failure) ([place])   COVID-19 infection Confirmed positive from nursing home up in [place]. Oxygen requirements have fluctuated and unsure of accuracy. Patient appears comfortable on lower flow oxygen. Started on Plaquenil and given a dose of Toclizomab -Continue hydroxychloroquine -Continue oxygen therapy for O2 saturations >90 -PCCM recommendations -Daily CRP, CBC, CMP, D-dimer, ferritin, fibrinogen, mag  AKI on CKD stage 3 Baseline creatinine of 1.4. Peak of 2.06 today. Lasix and losartan held on admission -Restart home Lasix and watch BMP  Chronic systolic and diastolic heart failure Last EF of 50-55% with grade 2 diastolic dysfunction from 03/2018. EF as low as 30% from 04/2016. On Coreg, losartan, lasix, aspirin as an outpatient. -Restart home lasix as mentioned above -Hold Losartan and Coreg in setting of soft blood pressures. Watch for rebound tachycardia  Elevated troponin Started on full dose aspirin. Troponin trending down. No chest pain. Transthoracic Echocardiogram ordered but plan for bedside Transthoracic Echocardiogram from cardiology today. -Telemetry  Diabetes mellitus, type 2 -Continue Lantus and SSI  Gout Stable -Continue allopurinol   Hyperlipidemia -Continue home Lipitor   DVT prophylaxis: Lovenox Code Status:   Code Status: Full  Code Family Communication: None at bedside Disposition Plan: Transfer to [facility]   Consultants:   PCCM  Procedures:   None  Antimicrobials:  Vancomycin (4/17)  Cefepime (4/17)   Subjective: No dyspnea or chest pain. Has a tremor that presented from a few weeks ago.  Objective: Vitals:   07/21/18 0400 07/21/18 0600 07/21/18 0700 07/21/18 0800  BP:  (!) 114/55 (!) 123/50 (!) 123/51  Pulse: 72 72 70 65  Resp: (!) 21 18  17   Temp: 98.7 F (37.1 C)     TempSrc: Oral     SpO2: 99% 100% 96% 100%  Weight:      Height:        Intake/Output Summary (Last 24 hours) at 07/21/2018 0954 Last data filed at 07/21/2018 0312 Gross per 24 hour  Intake 450.95 ml  Output -  Net 450.95 ml   Filed Weights   07/20/18 1328  Weight: 68 kg    Examination:  General exam: Appears calm and comfortable Respiratory system: Rales bilaterally. Respiratory effort normal. Cardiovascular system: S1 & S2 heard, RRR. No murmurs, rubs, gallops or clicks. Gastrointestinal system: Abdomen is nondistended, soft and nontender. No organomegaly or masses felt. Normal bowel sounds heard. Central nervous system: Alert and oriented. No focal neurological deficits. Extremities: No edema. No calf tenderness Skin: No cyanosis. No rashes Psychiatry: Judgement and insight appear normal. Mood & affect appropriate.     Data Reviewed: I have personally reviewed following labs and imaging studies  CBC: Recent Labs  Lab 07/20/18 1340 07/20/18 1357 07/20/18 1803 07/21/18 0614  WBC 7.9  --  6.7 7.0  NEUTROABS 6.4  --   --  6.0  HGB 9.4* 9.5* 8.7* 8.2*  HCT 30.1* 28.0* 29.1*  26.5*  MCV 89.3  --  90.4 90.8  PLT 316  --  287 643   Basic Metabolic Panel: Recent Labs  Lab 07/20/18 1340 07/20/18 1357 07/20/18 1803 07/21/18 0614  NA 132* 131*  --  134*  K 3.3* 3.4*  --  3.7  CL 98  --   --  102  CO2 20*  --   --  22  GLUCOSE 189*  --   --  168*  BUN 41*  --   --  48*  CREATININE 2.03*  --  2.01*  2.06*  CALCIUM 8.5*  --   --  8.3*   GFR: Estimated Creatinine Clearance: 24 mL/min (A) (by C-G formula based on SCr of 2.06 mg/dL (H)). Liver Function Tests: Recent Labs  Lab 07/20/18 1340 07/21/18 0614  AST 30 25  ALT 22 19  ALKPHOS 81 65  BILITOT 0.5 0.4  PROT 6.5 5.7*  ALBUMIN 2.7* 2.2*   No results for input(s): LIPASE, AMYLASE in the last 168 hours. No results for input(s): AMMONIA in the last 168 hours. Coagulation Profile: No results for input(s): INR, PROTIME in the last 168 hours. Cardiac Enzymes: Recent Labs  Lab 07/20/18 1340 07/20/18 1803 07/21/18 0043 07/21/18 0614  TROPONINI 1.30* 1.27* 0.83* 0.70*   BNP (last 3 results) No results for input(s): PROBNP in the last 8760 hours. HbA1C: No results for input(s): HGBA1C in the last 72 hours. CBG: Recent Labs  Lab 07/20/18 1345 07/20/18 1810 07/20/18 2055 07/21/18 0011 07/21/18 0915  GLUCAP 147* 162* 168* 177* 172*   Lipid Profile: Recent Labs    07/20/18 1803  TRIG 137   Thyroid Function Tests: No results for input(s): TSH, T4TOTAL, FREET4, T3FREE, THYROIDAB in the last 72 hours. Anemia Panel: Recent Labs    07/20/18 1803  FERRITIN 398*   Sepsis Labs: Recent Labs  Lab 07/20/18 1340 07/20/18 1803  PROCALCITON  --  1.02  LATICACIDVEN 1.2  --     Recent Results (from the past 240 hour(s))  Culture, blood (routine x 2)     Status: None (Preliminary result)   Collection Time: 07/20/18  1:40 PM  Result Value Ref Range Status   Specimen Description   Final    BLOOD LEFT ANTECUBITAL Performed at [facility] [address] [place], [place] [address]    Special Requests   Final    BOTTLES DRAWN AEROBIC AND ANAEROBIC Blood Culture adequate volume Performed at [facility] [address] [place], [place] [address]    Culture   Final    NO GROWTH < 24 HOURS Performed at [facility] [address] [place], [place] [address]    Report  Status PENDING  Incomplete  Culture, blood (routine x 2)     Status: None (Preliminary result)   Collection Time: 07/20/18  2:20 PM  Result Value Ref Range Status   Specimen Description   Final    BLOOD RIGHT ANTECUBITAL Performed at [facility] [address] [place], [place] [address]    Special Requests   Final    BOTTLES DRAWN AEROBIC AND ANAEROBIC Blood Culture adequate volume Performed at [facility] [address] [place], [place] [address]    Culture   Final    NO GROWTH < 24 HOURS Performed at [facility] [address], [place], [place] [address]    Report Status PENDING  Incomplete  MRSA PCR Screening  Status: None   Collection Time: 07/21/18  3:12 AM  Result Value Ref Range Status   MRSA by PCR NEGATIVE NEGATIVE Final    Comment:        The GeneXpert MRSA Assay (FDA approved for NASAL specimens only), is one component of a comprehensive MRSA colonization surveillance program. It is not intended to diagnose MRSA infection nor to guide or monitor treatment for MRSA infections. Performed at [facility], [place] [address] [place], [place] [address]          Radiology Studies: Dg Chest Port 1 View  Result Date: 07/21/2018 CLINICAL DATA:  Respiratory failure EXAM: PORTABLE CHEST 1 VIEW COMPARISON:  July 20, 2018 FINDINGS: The heart, hila, and mediastinum are unchanged. No focal infiltrate on the right. Increasing opacity in the left mid lung. No pneumothorax. No other acute abnormalities. IMPRESSION: Increasing infiltrate in the left mid lung. No other acute abnormalities noted. Electronically Signed   By: Dorise Bullion III M.D   On: 07/21/2018 08:32   Dg Chest Portable 1 View  Result Date: 07/20/2018 CLINICAL DATA:  Shortness of breath.  Recent positive COVID-19 test EXAM: PORTABLE CHEST 1 VIEW COMPARISON:  March 20, 2018 FINDINGS: There are areas of airspace consolidation in both lung  bases as well as in the left upper lobe. Heart is mildly enlarged with pulmonary vascularity normal. No adenopathy. No bone lesions. IMPRESSION: Areas of apparent pneumonia in both lower lung zones as well as in the left upper lobe. Lungs elsewhere are clear. Heart is mildly enlarged. No adenopathy evident. Electronically Signed   By: Lowella Grip III M.D.   On: 07/20/2018 14:47        Scheduled Meds: . allopurinol  300 mg Oral Daily  . aspirin EC  325 mg Oral Daily  . enoxaparin (LOVENOX) injection  40 mg Subcutaneous Daily  . insulin aspart  0-5 Units Subcutaneous QHS  . insulin aspart  0-9 Units Subcutaneous TID WC  . insulin glargine  20 Units Subcutaneous QHS  . pantoprazole  40 mg Oral Daily  . sodium chloride flush  3 mL Intravenous Q12H   Continuous Infusions: . sodium chloride    . dextrose 5 % and 0.9 % NaCl with KCl 40 mEq/L Stopped (07/20/18 2311)     LOS: 1 day     Cordelia Poche, MD [facility] 07/21/2018, 9:54 AM  If 7PM-7AM, please contact night-coverage [email or web address]

## 2018-07-21 NOTE — Consult Note (Signed)
NAME:  Kara Hanson, MRN:  [id number], DOB:  1955-07-17, LOS: 1 ADMISSION DATE:  07/20/2018, CONSULTATION DATE: 07/20/2018 REFERRING MD: Dr. Wynelle Cleveland, CHIEF COMPLAINT: Hypoxic respiratory failure  Brief History     History of present illness   63 year old chronically ill woman with a history of hypertension, systolic and diastolic chronic CHF, CAD, diabetes, prior stroke, chronic renal insufficiency stage III.  She was hospitalized for acute on chronic respiratory failure in late January/early February in [place], required ICU care.  Was treated for exacerbation of CHF and also possible community-acquired pneumonia.  She was discharged to a rehab facility and was starting to improve, walking the hallways on room air.  Apparently she had a chest x-ray that was concerning for possible residual pneumonia and she was treated with levofloxacin.  Also per her report she had a direct exposure to the novel coronavirus through her caregivers and was tested on 07/12/2018 just before she was discharged from rehab to travel back to [place].  She was informed that the test was positive on 4/13 and instructed to self isolate.  Since coming home she has had some slow progressive dyspnea.  Beginning about 5 to 6 days ago she developed some sore throat, dry cough, nausea without emesis, change in her sense of smell and taste.  She denies any chest pain, diarrhea.  She may have had a slight increase in her lower extremity edema.  She is unsure if she is had any weight gain.  She denies any noncompliance with her medications in particular her diuretics.  No fevers at home. She presented to the ED with hypoxemia, initially required 1.00 NRB, then to 12L/min HFNC.  Temperature on evaluation in the ED 4/17 100.9 rectal.  Chest x-ray 4/17 reviewed by me shows prominent bibasilar interstitial markings with possible left upper lobe infiltrate as well.  Labs significant for acute on chronic renal insufficiency, elevated BNP,  troponin I 0.30 in absence of chest pain.  Past Medical History   has a past medical history of CHF (congestive heart failure) ([place]), CVA (cerebral vascular accident) ([place]), Diabetes mellitus without complication ([place]), Hypercholesteremia, Hypertension, Myocardial infarction ([place]), and Spinal stenosis.   Significant Hospital Events     Consults:    Procedures:    Significant Diagnostic Tests:    Micro Data:  Blood 4/17 >>   Antimicrobials:  Cefepime 4/17 >>   Interim history/subjective:  She feels that her dyspnea is somewhat improved. She has been on 4 L/min high flow nasal cannula, although flowsheet documents 12 L/min.  She was never titrated back to 12 L/min since she was in the ER yesterday afternoon  Objective   Blood pressure (!) 123/52, pulse 70, temperature 98.1 F (36.7 C), temperature source Oral, resp. rate 12, height 5' (1.524 m), weight 68 kg, SpO2 98 %.        Intake/Output Summary (Last 24 hours) at 07/21/2018 1101 Last data filed at 07/21/2018 1517 Gross per 24 hour  Intake 450.95 ml  Output --  Net 450.95 ml   Filed Weights   07/20/18 1328  Weight: 68 kg    Examination: Examined by me 4/18, no significant changes from below  General: Pleasant ill-appearing obese woman, no distress on oxygen HENT: Oropharynx moist, no lesions, sclera anicteric and without edema Lungs: Decreased but clear, no wheezing, no crackles Cardiovascular: Regular, borderline bradycardia, no murmur Abdomen: Obese, soft, nondistended, mild diffuse tenderness but no rebound, no guarding, positive bowel sounds Extremities: Tender palpation, trace pretibial edema  Neuro: Awake, alert, interacting appropriately, follows commands, moves all extremities  Resolved Hospital Problem list     Assessment & Plan:  Acute hypoxemic respiratory failure with mild but bilateral infiltrates.  Differential diagnosis includes acute exacerbation of her known CHF.  Also evolving viral  pneumonitis from her documented COVID-19.  Less likely but possible bacterial healthcare associated pneumonia.  She was just treated with antibiotics during the [place] hospitalization and rehab, completed Levaquin.  -Continue to wean her FiO2 as able.  Follow chest x-ray for any evidence of progressive pulmonary infiltrates. -She was started empirically on cefepime.  Would narrow quickly if suspicion for bacterial process decreases, following procalcitonin, WBC, clinical status, cultures  COVID-19 infection -At high risk for progression, cytokine storm.  Agree with tocilizumab, hydroxychloroquine, continue per protocol -Follow chest x-ray closely -She can transition to a progressive bed at [facility].  Likely can transition to standard nasal cannula oxygen but if she does not tolerate them would put her on a 100% facemask to facilitate transport and minimize any possible aerosolization on high flow nasal cannula -NO BiPAP  Hypertension with systolic and diastolic CHF -Agree with diuresis as her renal function and hemodynamics will allow  Positive troponin, suspect type II NSTEMI -Discussed with Dr. Radford Pax this morning.  Quick bedside echocardiogram to be performed to evaluate LV function, ensure no evidence for ACS.  She is at some risk for viral myocarditis  -Agree with following troponin and clinical status  Acute on chronic renal insufficiency -Follow BMP, urine output closely  Best practice:  Diet: Heart healthy, carb modified Pain/Anxiety/Delirium protocol (if indicated): N/A VAP protocol (if indicated): N/A DVT prophylaxis: Enoxaparin GI prophylaxis: PPI Glucose control: SSI Mobility: Out of bed with assist Code Status: Full code Family Communication: No family present currently.  Discussed plan of care with the patient and with Dr. Wynelle Cleveland Disposition: Med Floor bed at [place]: Recent Labs  Lab 07/20/18 1340 07/20/18 1357 07/20/18 1803 07/21/18 0614  WBC 7.9  --   6.7 7.0  NEUTROABS 6.4  --   --  6.0  HGB 9.4* 9.5* 8.7* 8.2*  HCT 30.1* 28.0* 29.1* 26.5*  MCV 89.3  --  90.4 90.8  PLT 316  --  287 564    Basic Metabolic Panel: Recent Labs  Lab 07/20/18 1340 07/20/18 1357 07/20/18 1803 07/21/18 0614  NA 132* 131*  --  134*  K 3.3* 3.4*  --  3.7  CL 98  --   --  102  CO2 20*  --   --  22  GLUCOSE 189*  --   --  168*  BUN 41*  --   --  48*  CREATININE 2.03*  --  2.01* 2.06*  CALCIUM 8.5*  --   --  8.3*   GFR: Estimated Creatinine Clearance: 24 mL/min (A) (by C-G formula based on SCr of 2.06 mg/dL (H)). Recent Labs  Lab 07/20/18 1340 07/20/18 1803 07/21/18 0614  PROCALCITON  --  1.02  --   WBC 7.9 6.7 7.0  LATICACIDVEN 1.2  --   --     Liver Function Tests: Recent Labs  Lab 07/20/18 1340 07/21/18 0614  AST 30 25  ALT 22 19  ALKPHOS 81 65  BILITOT 0.5 0.4  PROT 6.5 5.7*  ALBUMIN 2.7* 2.2*   No results for input(s): LIPASE, AMYLASE in the last 168 hours. No results for input(s): AMMONIA in the last 168 hours.  ABG    Component Value  Date/Time   HCO3 22.6 07/20/2018 1357   TCO2 24 07/20/2018 1357   ACIDBASEDEF 2.0 07/20/2018 1357   O2SAT 58.0 07/20/2018 1357     Coagulation Profile: No results for input(s): INR, PROTIME in the last 168 hours.  Cardiac Enzymes: Recent Labs  Lab 07/20/18 1340 07/20/18 1803 07/21/18 0043 07/21/18 0614  TROPONINI 1.30* 1.27* 0.83* 0.70*    HbA1C: Hemoglobin A1C  Date/Time Value Ref Range Status  03/29/2018 11:19 AM 11.8 (A) 4.0 - 5.6 % Final   HbA1c, POC (controlled diabetic range)  Date/Time Value Ref Range Status  09/28/2017 03:40 PM 13.3 (A) 0.0 - 7.0 % Final   Hgb A1c MFr Bld  Date/Time Value Ref Range Status  11/27/2017 07:23 AM 9.6 (H) 4.8 - 5.6 % Final    Comment:    (NOTE) Pre diabetes:          5.7%-6.4% Diabetes:              >6.4% Glycemic control for   <7.0% adults with diabetes   10/01/2016 01:53 AM 13.8 (H) 4.8 - 5.6 % Final    Comment:    (NOTE)          Pre-diabetes: 5.7 - 6.4         Diabetes: >6.4         Glycemic control for adults with diabetes: <7.0     CBG: Recent Labs  Lab 07/20/18 1345 07/20/18 1810 07/20/18 2055 07/21/18 0011 07/21/18 0915  GLUCAP 147* 162* 168* 177* 172*     Baltazar Apo, MD, PhD 07/21/2018, 11:01 AM [facility] [phone] or if no answer [phone]

## 2018-07-21 NOTE — Progress Notes (Signed)
PROGRESS NOTE  Kara Hanson is a 63 y.o. female with a history of recent prolonged hospitalization/rehab stay in [place], chronic HFpEF, CAD, CKD III, T2DM, CVA who presented with about 5-6 days of sore throat and nonproductive cough. She was contacted by the rehab and told she had tested positive for covid, recommended to self-isolate and present if symptoms occur. She required NRB on admission, had some troponin elevation with chest pressure which she reports is resolved. Shortness of breath has improved and oxygen requirements are titrated downward.   +Bibasilar crackles, nonlabored talking on the phone. Abdominal bloating noted, trace LE edema.   Acute hypoxic respiratory failure due to covid, likely CHF exacerbation as well.  - HCQ, received actemra. High risk for decompensation. - Also on antibiotic which I will not change at this time. Follow labs. - AM labs ordered - ?if po lasix absorbed, will give IV lasix x1. Has crackles. Monitor I/O, daily weights (asked RN if this is available), and creatinine in AM.  - Prone as able - IS  Troponin elevation in pt w/severe respiratory distress and known preexisting CAD: Suspect demand ischemia: Chest pressure resolved, enzymes declining.  - Continue ASA - Echo ordered to eval WMA's  Patrecia Pour, MD Pager [phone] 07/21/2018, 7:07 PM

## 2018-07-22 DIAGNOSIS — J9621 Acute and chronic respiratory failure with hypoxia: Secondary | ICD-10-CM

## 2018-07-22 DIAGNOSIS — N179 Acute kidney failure, unspecified: Secondary | ICD-10-CM

## 2018-07-22 DIAGNOSIS — N183 Chronic kidney disease, stage 3 (moderate): Secondary | ICD-10-CM

## 2018-07-22 LAB — CBC WITH DIFFERENTIAL/PLATELET
Abs Immature Granulocytes: 0.03 10*3/uL (ref 0.00–0.07)
Basophils Absolute: 0 10*3/uL (ref 0.0–0.1)
Basophils Relative: 0 %
Eosinophils Absolute: 0.2 10*3/uL (ref 0.0–0.5)
Eosinophils Relative: 4 %
HCT: 27.7 % — ABNORMAL LOW (ref 36.0–46.0)
Hemoglobin: 8.5 g/dL — ABNORMAL LOW (ref 12.0–15.0)
Immature Granulocytes: 1 %
Lymphocytes Relative: 18 %
Lymphs Abs: 0.7 10*3/uL (ref 0.7–4.0)
MCH: 27.5 pg (ref 26.0–34.0)
MCHC: 30.7 g/dL (ref 30.0–36.0)
MCV: 89.6 fL (ref 80.0–100.0)
Monocytes Absolute: 0.4 10*3/uL (ref 0.1–1.0)
Monocytes Relative: 10 %
Neutro Abs: 2.5 10*3/uL (ref 1.7–7.7)
Neutrophils Relative %: 67 %
Platelets: 298 10*3/uL (ref 150–400)
RBC: 3.09 MIL/uL — ABNORMAL LOW (ref 3.87–5.11)
RDW: 17.8 % — ABNORMAL HIGH (ref 11.5–15.5)
WBC: 3.7 10*3/uL — ABNORMAL LOW (ref 4.0–10.5)
nRBC: 0.5 % — ABNORMAL HIGH (ref 0.0–0.2)

## 2018-07-22 LAB — COMPREHENSIVE METABOLIC PANEL
ALT: 21 U/L (ref 0–44)
AST: 22 U/L (ref 15–41)
Albumin: 2.4 g/dL — ABNORMAL LOW (ref 3.5–5.0)
Alkaline Phosphatase: 71 U/L (ref 38–126)
Anion gap: 10 (ref 5–15)
BUN: 50 mg/dL — ABNORMAL HIGH (ref 8–23)
CO2: 22 mmol/L (ref 22–32)
Calcium: 8.7 mg/dL — ABNORMAL LOW (ref 8.9–10.3)
Chloride: 102 mmol/L (ref 98–111)
Creatinine, Ser: 1.72 mg/dL — ABNORMAL HIGH (ref 0.44–1.00)
GFR calc Af Amer: 36 mL/min — ABNORMAL LOW (ref >60)
GFR calc non Af Amer: 31 mL/min — ABNORMAL LOW (ref >60)
Glucose, Bld: 106 mg/dL — ABNORMAL HIGH (ref 70–99)
Potassium: 3.6 mmol/L (ref 3.5–5.1)
Sodium: 134 mmol/L — ABNORMAL LOW (ref 135–145)
Total Bilirubin: 0.4 mg/dL (ref 0.3–1.2)
Total Protein: 6 g/dL — ABNORMAL LOW (ref 6.5–8.1)

## 2018-07-22 LAB — GLUCOSE, CAPILLARY
Glucose-Capillary: 110 mg/dL — ABNORMAL HIGH (ref 70–99)
Glucose-Capillary: 212 mg/dL — ABNORMAL HIGH (ref 70–99)
Glucose-Capillary: 198 mg/dL — ABNORMAL HIGH (ref 70–99)
Glucose-Capillary: 155 mg/dL — ABNORMAL HIGH (ref 70–99)

## 2018-07-22 LAB — PROCALCITONIN: Procalcitonin: 0.5 ng/mL

## 2018-07-22 LAB — FERRITIN: Ferritin: 301 ng/mL (ref 11–307)

## 2018-07-22 LAB — FIBRINOGEN: Fibrinogen: >800 mg/dL — ABNORMAL HIGH (ref 210–475)

## 2018-07-22 LAB — MAGNESIUM: Magnesium: 1.9 mg/dL (ref 1.7–2.4)

## 2018-07-22 LAB — C-REACTIVE PROTEIN: CRP: 10.5 mg/dL — ABNORMAL HIGH (ref <1.0)

## 2018-07-22 LAB — D-DIMER, QUANTITATIVE (NOT AT ARMC): D-Dimer, Quant: 2.05 ug/mL-FEU — ABNORMAL HIGH (ref 0.00–0.50)

## 2018-07-22 MED ORDER — POTASSIUM CHLORIDE CRYS ER 20 MEQ PO TBCR
20.0000 meq | EXTENDED_RELEASE_TABLET | Freq: Two times a day (BID) | ORAL | Status: AC
  Administered 2018-07-22 (×2): 20 meq via ORAL
  Filled 2018-07-22 (×2): qty 1

## 2018-07-22 MED ORDER — FUROSEMIDE 10 MG/ML IJ SOLN: 40.0000 mg | Freq: Two times a day (BID) | INTRAMUSCULAR | Status: DC

## 2018-07-22 MED ORDER — CARVEDILOL 3.125 MG PO TABS
6.2500 mg | ORAL_TABLET | Freq: Two times a day (BID) | ORAL | Status: DC
  Administered 2018-07-22 – 2018-07-26 (×9): 6.25 mg via ORAL
  Filled 2018-07-22 (×12): qty 2

## 2018-07-22 MED ORDER — FUROSEMIDE 10 MG/ML IJ SOLN
40.0000 mg | Freq: Three times a day (TID) | INTRAMUSCULAR | Status: DC
  Administered 2018-07-22 – 2018-07-26 (×13): 40 mg via INTRAVENOUS
  Filled 2018-07-22 (×14): qty 4

## 2018-07-22 NOTE — Evaluation (Signed)
Physical Therapy Evaluation Patient Details Name: Kara Hanson MRN: [id number] DOB: 1955-05-07 Today's Date: 07/22/2018   History of Present Illness  63 y.o. female past medical history diabetes mellitus type 2, chronic diastolic heart failure who was recently in [place] where she was in the ICU for which she was treated with a course of Levaquin for about 2 months subsequently went to rehab, came to [place] tested positive for Covid 19 on 07/12/2018  Also per report patient had a direct exposure to 1 of her caregivers who tested positive for Covid on 07/12/2018;  She presented to the ED with hypoxia at 12 L of high flow nasal cannula  Clinical Impression  Pt admitted with above diagnosis. Pt currently with functional limitations due to the deficits listed below (see PT Problem List). Pt cooperative, anxious at times regarding falls and feeling of difficulty breathing with her mask in place for mobility; It sound as if pt has strong family support and should be able to d/c home with HHPT;  VSS--see below for details  Pt will benefit from skilled PT to increase their independence and safety with mobility to allow discharge to the venue listed below.       Follow Up Recommendations Home health PT    Equipment Recommendations  None recommended by PT    Recommendations for Other Services       Precautions / Restrictions Precautions Precautions: Fall Precaution Comments: monitor VS ( pt reported hx of legs "giving way") Restrictions Weight Bearing Restrictions: No      Mobility  Bed Mobility Overal bed mobility: Needs Assistance Bed Mobility: Supine to Sit     Supine to sit: Min assist;HOB elevated     General bed mobility comments: assist with trunk, incr time and effort  Transfers Overall transfer level: Needs assistance Equipment used: Rolling walker (2 wheeled) Transfers: Sit to/from [facility] Sit to Stand: Min assist;+2 safety/equipment Stand  pivot transfers: Min assist;+2 safety/equipment       General transfer comment: assist fro anterior-superior wt shift and to transition to RW, cues for hand placement and safety  Ambulation/Gait Ambulation/Gait assistance: Min assist Gait Distance (Feet): 25 Feet Assistive device: Rolling walker (2 wheeled) Gait Pattern/deviations: Step-through pattern;Narrow base of support;Decreased stride length     General Gait Details: slow, unsteady gait, min assist for balance, cues for RW safety and posture; SpO2 88-92% on RA, pt returned to O2 at 2L once at rest in room; pt with incr anxiety with mask placement feeling that she could not breathe, however O2 sats were stable, HR stable  Stairs            Wheelchair Mobility    Modified Rankin (Stroke Patients Only)       Balance Overall balance assessment: Needs assistance Sitting-balance support: No upper extremity supported;Feet supported Sitting balance-Leahy Scale: Fair       Standing balance-Leahy Scale: Poor Standing balance comment: reliant on UEs                             Pertinent Vitals/Pain Pain Assessment: No/denies pain    Home Living Family/patient expects to be discharged to:: Private residence Living Arrangements: Children Available Help at Discharge: Family;Available PRN/intermittently(son and dtr-in-law) Type of Home: House Home Access: Stairs to enter Entrance Stairs-Rails: Right Entrance Stairs-Number of Steps: 4 Home Layout: One level Home Equipment: Walker - 2 wheels      Prior Function Level of Independence:  Needs assistance;Independent with assistive device(s)   Gait / Transfers Assistance Needed: amb with RW  ADL's / Homemaking Assistance Needed: sponger bathes  unless dtr-in-law assists wtih shower; assist with dressing at times        Hand Dominance        Extremity/Trunk Assessment   Upper Extremity Assessment Upper Extremity Assessment: Generalized weakness     Lower Extremity Assessment Lower Extremity Assessment: Generalized weakness       Communication   Communication: No difficulties  Cognition Arousal/Alertness: Awake/alert Behavior During Therapy: WFL for tasks assessed/performed Overall Cognitive Status: Within Functional Limits for tasks assessed                                 General Comments: pleasant and cooperative      General Comments      Exercises     Assessment/Plan    PT Assessment Patient needs continued PT services  PT Problem List Decreased strength;Decreased activity tolerance;Decreased balance;Decreased mobility       PT Treatment Interventions DME instruction;Gait training;Functional mobility training;Therapeutic exercise;Balance training;Patient/family education;Therapeutic activities    PT Goals (Current goals can be found in the Care Plan section)  Acute Rehab PT Goals Patient Stated Goal: get better PT Goal Formulation: With patient Time For Goal Achievement: 08/05/18 Potential to Achieve Goals: Good    Frequency Min 3X/week   Barriers to discharge        Co-evaluation               AM-PAC PT "6 Clicks" Mobility  Outcome Measure Help needed turning from your back to your side while in a flat bed without using bedrails?: A Little Help needed moving from lying on your back to sitting on the side of a flat bed without using bedrails?: A Little Help needed moving to and from a bed to a chair (including a wheelchair)?: A Little Help needed standing up from a chair using your arms (e.g., wheelchair or bedside chair)?: A Little Help needed to walk in hospital room?: A Little Help needed climbing 3-5 steps with a railing? : A Lot 6 Click Score: 17    End of Session Equipment Utilized During Treatment: Gait belt Activity Tolerance: Patient tolerated treatment well Patient left: with call bell/phone within reach;in chair   PT Visit Diagnosis: Unsteadiness on feet  (R26.81);Muscle weakness (generalized) (M62.81)    Time: 5784-6962 PT Time Calculation (min) (ACUTE ONLY): 26 min   Charges:   PT Evaluation $PT Eval Low Complexity: 1 Low PT Treatments $Gait Training: 8-22 mins        Kenyon Ana, PT  Pager: [phone] Acute Rehab Dept [facility]): [phone]   07/22/2018   [facility] 07/22/2018, 7:23 PM

## 2018-07-22 NOTE — Progress Notes (Signed)
[facility] PROGRESS NOTE    Progress Note  Shaguana Love  DIY:[id number] DOB: [birth year] DOA: 07/20/2018 PCP: [name], MD     Brief Narrative:   Cashae Weich is an 63 y.o. female past medical history diabetes mellitus type 2, chronic diastolic heart failure who was recently in [place] where she was in the ICU for which she was treated with a course of Levaquin for about 2 months subsequently went to rehab, came to [place] tested positive on 07/12/2018 on admission patient was sleepy and hard to arouse.  Also per report patient had a direct exposure to 1 of her caregivers who tested positive for corona virus on 07/12/2018.  About 6 days prior to admission she had a sore throat dry cough without emesis.  She presented to the ED with hypoxia at 12 L of high flow nasal cannula She was found to have a temperature of 100.9 requiring 3 L of oxygen  Imaging: Chest x-ray done on 07/20/2018 show apparent infiltrate in both lower lung zones.  Assessment/Plan:   Acute respiratory failure with hypoxia due to Pneumonia due to COVID-19 virus: She is satting greater than 96% on 2 L of oxygen Troponin trending down CRP 17.7 >10.5, ESR 135 D-dimer 2.5 was decreasing, today's d-dimer is pending. Fibrinogen greater than 800.  Ferritin was 400 >>300 Interleukin-6 was 42 Procalcitonin 1.0, will go ahead and repeat her procalcitonin. Lymphopenia 0.5 which is decreasing. She relates her shortness of breath is about the same Increase her Lasix to 3 times daily, fluid restrict her, she appears volume overloaded on physical exam. Consult physical therapy.  Acute kidney injury on chronic kidney disease stage III: Baseline creatinine of 1.4-1.6, her Lasix and ARB were held on admission. Increase her Lasix to 3 times daily, she has positive hepatojugular reflux and crackles at bases bilaterally.  Chronic systolic heart failure: With an EF of greater than 07% grade 2 diastolic heart  failure from 03/2018 Will restart Coreg at a lower dose Fluid overloaded on physical exam, increase her Lasix  Elevated cardiac biomarkers: Troponins are trending down.  Diabetes mellitus type 2: Cont.ew long-acting insulin plus sliding scale.  Essential hypertension Fairly controlled, will restart Coreg.   DVT prophylaxis: lovenox Family Communication:none Disposition Plan/Barrier to D/C: Unable to determine Code Status:     Code Status Orders  (From admission, onward)         Start     Ordered   07/20/18 1655  Full code  Continuous     07/20/18 1655        Code Status History    Date Active Date Inactive Code Status Order ID Comments User Context   03/20/2018 0402 03/24/2018 1949 Full Code [id number]  Norval Morton, MD ED   11/27/2017 0705 11/30/2017 1625 Full Code [id number]  Radene Gunning, NP ED   10/01/2016 0033 10/01/2016 1946 Full Code [id number]  Maryellen Pile, MD ED   06/17/2016 1720 06/18/2016 2022 Full Code [id number]  Reyne Dumas, MD ED   04/23/2016 0418 04/26/2016 1716 Full Code [id number]  Rise Patience, MD ED        IV Access:    Peripheral IV   Procedures and diagnostic studies:   Dg Chest Port 1 View  Result Date: 07/21/2018 CLINICAL DATA:  Respiratory failure EXAM: PORTABLE CHEST 1 VIEW COMPARISON:  July 20, 2018 FINDINGS: The heart, hila, and mediastinum are unchanged. No focal infiltrate on the right. Increasing opacity in the left mid lung. No pneumothorax.  No other acute abnormalities. IMPRESSION: Increasing infiltrate in the left mid lung. No other acute abnormalities noted. Electronically Signed   By: Dorise Bullion III M.D   On: 07/21/2018 08:32   Dg Chest Portable 1 View  Result Date: 07/20/2018 CLINICAL DATA:  Shortness of breath.  Recent positive COVID-19 test EXAM: PORTABLE CHEST 1 VIEW COMPARISON:  March 20, 2018 FINDINGS: There are areas of airspace consolidation in both lung bases as well as in the left upper lobe. Heart  is mildly enlarged with pulmonary vascularity normal. No adenopathy. No bone lesions. IMPRESSION: Areas of apparent pneumonia in both lower lung zones as well as in the left upper lobe. Lungs elsewhere are clear. Heart is mildly enlarged. No adenopathy evident. Electronically Signed   By: Lowella Grip III M.D.   On: 07/20/2018 14:47     Medical Consultants:    None.  Anti-Infectives:   None  Subjective:    [name] she relates her breathing is unchanged, she continues to have a persistent cough.  Objective:    Vitals:   07/21/18 2343 07/22/18 0000 07/22/18 0100 07/22/18 0400  BP: (!) 144/65 (!) 135/58 139/61 125/80  Pulse: 66 71 72 65  Resp: 18 19 (!) 31 (!) 21  Temp: 97.8 F (36.6 C)     TempSrc: Oral   Oral  SpO2: 100% 100% 97% 99%  Weight:      Height:        Intake/Output Summary (Last 24 hours) at 07/22/2018 0721 Last data filed at 07/22/2018 0105 Gross per 24 hour  Intake -  Output 875 ml  Net -875 ml   Filed Weights   07/20/18 1328  Weight: 68 kg    Exam: General exam: In no acute distress. Respiratory system: Good air movement with crackles at bases bilaterally. Cardiovascular system: Regular rate and rhythm with positive S1-S2, positive hepatojugular reflux crackles at bases bilaterally.  She has point tenderness on her third intercostal space reproducible by palpation. Gastrointestinal system: Positive bowel sounds soft nontender nondistended Central nervous system: Wake alert and oriented x3 nonfocal Extremities: Trace edema Skin: No rashes or ulcers. Psychiatry: Judgement and insight appear normal. Mood & affect appropriate.     Data Reviewed:    Labs: Basic Metabolic Panel: Recent Labs  Lab 07/20/18 1340 07/20/18 1357 07/20/18 1803 07/21/18 0614  NA 132* 131*  --  134*  K 3.3* 3.4*  --  3.7  CL 98  --   --  102  CO2 20*  --   --  22  GLUCOSE 189*  --   --  168*  BUN 41*  --   --  48*  CREATININE 2.03*  --  2.01* 2.06*   CALCIUM 8.5*  --   --  8.3*   GFR Estimated Creatinine Clearance: 24 mL/min (A) (by C-G formula based on SCr of 2.06 mg/dL (H)). Liver Function Tests: Recent Labs  Lab 07/20/18 1340 07/21/18 0614  AST 30 25  ALT 22 19  ALKPHOS 81 65  BILITOT 0.5 0.4  PROT 6.5 5.7*  ALBUMIN 2.7* 2.2*   No results for input(s): LIPASE, AMYLASE in the last 168 hours. No results for input(s): AMMONIA in the last 168 hours. Coagulation profile No results for input(s): INR, PROTIME in the last 168 hours.  CBC: Recent Labs  Lab 07/20/18 1340 07/20/18 1357 07/20/18 1803 07/21/18 0614  WBC 7.9  --  6.7 7.0  NEUTROABS 6.4  --   --  6.0  HGB 9.4*  9.5* 8.7*  8.0* 8.2*  HCT 30.1* 28.0* 29.1* 26.5*  MCV 89.3  --  90.4 90.8  PLT 316  --  287 268   Cardiac Enzymes: Recent Labs  Lab 07/20/18 1340 07/20/18 1803 07/21/18 0043 07/21/18 0614  TROPONINI 1.30* 1.27* 0.83* 0.70*   BNP (last 3 results) No results for input(s): PROBNP in the last 8760 hours. CBG: Recent Labs  Lab 07/20/18 2055 07/21/18 0011 07/21/18 0915 07/21/18 1458 07/21/18 2143  GLUCAP 168* 177* 172* 155* 175*   D-Dimer: Recent Labs    07/20/18 1803 07/21/18 0614  DDIMER 2.72* 2.56*   Hgb A1c: No results for input(s): HGBA1C in the last 72 hours. Lipid Profile: Recent Labs    07/20/18 1803  TRIG 137   Thyroid function studies: No results for input(s): TSH, T4TOTAL, T3FREE, THYROIDAB in the last 72 hours.  Invalid input(s): FREET3 Anemia work up: Recent Labs    07/20/18 1803  FERRITIN 398*   Sepsis Labs: Recent Labs  Lab 07/20/18 1340 07/20/18 1803 07/21/18 0614  PROCALCITON  --  1.02  --   WBC 7.9 6.7 7.0  LATICACIDVEN 1.2  --   --    Microbiology Recent Results (from the past 240 hour(s))  Culture, blood (routine x 2)     Status: None (Preliminary result)   Collection Time: 07/20/18  1:40 PM  Result Value Ref Range Status   Specimen Description   Final    BLOOD LEFT ANTECUBITAL Performed  at [facility], [place] [address], [place], [place] [address]    Special Requests   Final    BOTTLES DRAWN AEROBIC AND ANAEROBIC Blood Culture adequate volume Performed at [place] [address] [place], [place] [address]    Culture   Final    NO GROWTH < 24 HOURS Performed at [place] [address], [place], [place] [address]    Report Status PENDING  Incomplete  Culture, blood (routine x 2)     Status: None (Preliminary result)   Collection Time: 07/20/18  2:20 PM  Result Value Ref Range Status   Specimen Description   Final    BLOOD RIGHT ANTECUBITAL Performed at [place] [address] [place], [place] [address]    Special Requests   Final    BOTTLES DRAWN AEROBIC AND ANAEROBIC Blood Culture adequate volume Performed at [place] [address] [place], [place] [address]    Culture   Final    NO GROWTH < 24 HOURS Performed at [facility] [address] [place], [place] [address]    Report Status PENDING  Incomplete  MRSA PCR Screening     Status: None   Collection Time: 07/21/18  3:12 AM  Result Value Ref Range Status   MRSA by PCR NEGATIVE NEGATIVE Final    Comment:        The GeneXpert MRSA Assay (FDA approved for NASAL specimens only), is one component of a comprehensive MRSA colonization surveillance program. It is not intended to diagnose MRSA infection nor to guide or monitor treatment for MRSA infections. Performed at [facility], [place] [address] [place],  [address]      Medications:   . allopurinol  300 mg Oral Daily  . aspirin EC  325 mg Oral Daily  . enoxaparin (LOVENOX) injection  30 mg Subcutaneous Daily  . furosemide  20 mg Intravenous BID  . insulin aspart  0-5 Units  Subcutaneous QHS  . insulin aspart  0-9 Units Subcutaneous TID WC  . insulin glargine  20 Units Subcutaneous QHS  . pantoprazole  40 mg Oral  Daily  . sodium chloride flush  3 mL Intravenous Q12H   Continuous Infusions: . sodium chloride       LOS: 2 days   Charlynne Cousins  [facility]  07/22/2018, 7:21 AM

## 2018-07-22 NOTE — Progress Notes (Addendum)
Patient complaining of of sharp rt chest pain. Dr. Venetia Constable on unit assessed patient no other interventions needed at this time. Oral tylenol given and pain decreased.

## 2018-07-23 LAB — COMPREHENSIVE METABOLIC PANEL
ALT: 17 U/L (ref 0–44)
AST: 16 U/L (ref 15–41)
Albumin: 2.3 g/dL — ABNORMAL LOW (ref 3.5–5.0)
Alkaline Phosphatase: 63 U/L (ref 38–126)
Anion gap: 8 (ref 5–15)
BUN: 45 mg/dL — ABNORMAL HIGH (ref 8–23)
CO2: 23 mmol/L (ref 22–32)
Calcium: 8.8 mg/dL — ABNORMAL LOW (ref 8.9–10.3)
Chloride: 106 mmol/L (ref 98–111)
Creatinine, Ser: 1.43 mg/dL — ABNORMAL HIGH (ref 0.44–1.00)
GFR calc Af Amer: 45 mL/min — ABNORMAL LOW (ref >60)
GFR calc non Af Amer: 39 mL/min — ABNORMAL LOW (ref >60)
Glucose, Bld: 104 mg/dL — ABNORMAL HIGH (ref 70–99)
Potassium: 4.2 mmol/L (ref 3.5–5.1)
Sodium: 137 mmol/L (ref 135–145)
Total Bilirubin: 0.2 mg/dL — ABNORMAL LOW (ref 0.3–1.2)
Total Protein: 5.7 g/dL — ABNORMAL LOW (ref 6.5–8.1)

## 2018-07-23 LAB — CBC WITH DIFFERENTIAL/PLATELET
Abs Immature Granulocytes: 0.07 10*3/uL (ref 0.00–0.07)
Basophils Absolute: 0 10*3/uL (ref 0.0–0.1)
Basophils Relative: 1 %
Eosinophils Absolute: 0.2 10*3/uL (ref 0.0–0.5)
Eosinophils Relative: 6 %
HCT: 27.1 % — ABNORMAL LOW (ref 36.0–46.0)
Hemoglobin: 8.5 g/dL — ABNORMAL LOW (ref 12.0–15.0)
Immature Granulocytes: 2 %
Lymphocytes Relative: 23 %
Lymphs Abs: 0.8 10*3/uL (ref 0.7–4.0)
MCH: 28 pg (ref 26.0–34.0)
MCHC: 31.4 g/dL (ref 30.0–36.0)
MCV: 89.1 fL (ref 80.0–100.0)
Monocytes Absolute: 0.4 10*3/uL (ref 0.1–1.0)
Monocytes Relative: 11 %
Neutro Abs: 1.9 10*3/uL (ref 1.7–7.7)
Neutrophils Relative %: 57 %
Platelets: 341 10*3/uL (ref 150–400)
RBC: 3.04 MIL/uL — ABNORMAL LOW (ref 3.87–5.11)
RDW: 17.7 % — ABNORMAL HIGH (ref 11.5–15.5)
WBC: 3.3 10*3/uL — ABNORMAL LOW (ref 4.0–10.5)
nRBC: 1.2 % — ABNORMAL HIGH (ref 0.0–0.2)

## 2018-07-23 LAB — GLUCOSE, CAPILLARY
Glucose-Capillary: 93 mg/dL (ref 70–99)
Glucose-Capillary: 172 mg/dL — ABNORMAL HIGH (ref 70–99)
Glucose-Capillary: 187 mg/dL — ABNORMAL HIGH (ref 70–99)
Glucose-Capillary: 209 mg/dL — ABNORMAL HIGH (ref 70–99)

## 2018-07-23 LAB — PROCALCITONIN: Procalcitonin: 0.32 ng/mL

## 2018-07-23 LAB — MAGNESIUM: Magnesium: 1.7 mg/dL (ref 1.7–2.4)

## 2018-07-23 LAB — C-REACTIVE PROTEIN: CRP: 5 mg/dL — ABNORMAL HIGH (ref <1.0)

## 2018-07-23 LAB — FERRITIN: Ferritin: 234 ng/mL (ref 11–307)

## 2018-07-23 LAB — FIBRINOGEN: Fibrinogen: 670 mg/dL — ABNORMAL HIGH (ref 210–475)

## 2018-07-23 LAB — D-DIMER, QUANTITATIVE: D-Dimer, Quant: 1.97 ug/mL-FEU — ABNORMAL HIGH (ref 0.00–0.50)

## 2018-07-23 MED ORDER — ASPIRIN EC 81 MG PO TBEC
81.0000 mg | DELAYED_RELEASE_TABLET | Freq: Every day | ORAL | Status: DC
  Administered 2018-07-24 – 2018-07-26 (×3): 81 mg via ORAL
  Filled 2018-07-23 (×3): qty 1

## 2018-07-23 NOTE — Progress Notes (Signed)
Pt up to [facility] on 1.5 L [place]. sats dfropped to 96%, increased O2 to 2L [place] as pt was reporting she "couldn't get any oxygen." After several minuites sats improved and pt states she can breathe better with head of bed elevated, O2 dropped back to 1.5 L  99%

## 2018-07-23 NOTE — Progress Notes (Signed)
Patient states COVID-19 testing was performed at [facility] in [place], [place].  [phone] or [phone]

## 2018-07-23 NOTE — TOC Initial Note (Signed)
Transition of Care [facility]) - Initial/Assessment Note    Patient Details  Name: Kara Hanson MRN: [id number] Date of Birth: 1956-03-12  Transition of Care [facility]) CM/SW Contact:    Ninfa Meeker, RN Phone Number: 07/23/2018, 11:10 AM  Clinical Narrative:    63 y.o. female  who was recently in [place] where she was in the ICU for which she was treated with a course of Levaquin for about 2 months subsequently went to rehab, came to [place] tested positive on 07/12/2018   Also per report patient had a direct exposure to 1 of her caregivers who tested positive for corona virus on 07/12/2018.              Patient is improving and CM has spoken with her concerning plan for discharge. Choice offered for Home Health agency, referral called to Adela Lank, [facility] Liaison. Patient says she will go home with her son to recover, doesn't remember his address. CM has been trying to contact daughter to get address. Will continue to follow.        Patient Goals and CMS Choice        Expected Discharge Plan and Services           Expected Discharge Date: (unknown)                        Prior Living Arrangements/Services                       Activities of Daily Living Home Assistive Devices/Equipment: CBG Meter, Dentures (specify type), Walker (specify type)(upper denture (has lower denture but it does not fit), front wheeled walker) ADL Screening (condition at time of admission) Patient's cognitive ability adequate to safely complete daily activities?: Yes Is the patient deaf or have difficulty hearing?: No Does the patient have difficulty seeing, even when wearing glasses/contacts?: No Does the patient have difficulty concentrating, remembering, or making decisions?: No Patient able to express need for assistance with ADLs?: Yes Does the patient have difficulty dressing or bathing?: No Independently performs ADLs?: Yes (appropriate for developmental  age) Does the patient have difficulty walking or climbing stairs?: Yes(secondary to shortness of breath) Weakness of Legs: Both Weakness of Arms/Hands: Both  Permission Sought/Granted                  Emotional Assessment              Admission diagnosis:  Hypoxia [R09.02] HCAP (healthcare-associated pneumonia) [J18.9] Suspected Covid-19 Virus Infection [R68.89] COVID-19 virus infection [U07.1] Patient Active Problem List   Diagnosis Date Noted  . COVID-19 virus infection 07/20/2018  . Chronic diastolic CHF (congestive heart failure) ([place]) 07/20/2018  . Pneumonia due to COVID-19 virus 07/20/2018  . Vitamin D deficiency 04/02/2018  . Spinal stenosis 03/29/2018  . Acute and chronic respiratory failure with hypoxia ([place]) 03/20/2018  . Diabetes mellitus type 2 with complications, uncontrolled ([place]) 03/20/2018  . CKD (chronic kidney disease), stage III ([place]) 03/20/2018  . Acute on chronic diastolic CHF (congestive heart failure) ([place]) 11/27/2017  . Acute kidney injury ([place]) 11/27/2017  . Acute respiratory distress 11/27/2017  . Bulging lumbar disc 11/14/2017  . Gout 11/14/2017  . HLD (hyperlipidemia) 09/30/2016  . Chest pain 06/17/2016  . Stress-induced cardiomyopathy 05/19/2016  . Type 2 diabetes mellitus with diabetic neuropathy, unspecified ([place]) 05/06/2016  . Vertigo 05/06/2016  . Controlled type 2 diabetes mellitus with hyperglycemia ([place]) 04/23/2016  .  Hypertension 04/23/2016  . Normochromic normocytic anemia 04/23/2016  . Syncope 04/23/2016   PCP:  [name], MD Pharmacy:   [place], [place] - [address] [place] [address] Phone: [phone] Fax: [phone]     Social Determinants of Health (SDOH) Interventions    Readmission Risk Interventions No flowsheet data found.

## 2018-07-24 LAB — CBC WITH DIFFERENTIAL/PLATELET
Abs Immature Granulocytes: 0.12 10*3/uL — ABNORMAL HIGH (ref 0.00–0.07)
Basophils Absolute: 0 10*3/uL (ref 0.0–0.1)
Basophils Relative: 1 %
Eosinophils Absolute: 0.2 10*3/uL (ref 0.0–0.5)
Eosinophils Relative: 4 %
HCT: 29.3 % — ABNORMAL LOW (ref 36.0–46.0)
Hemoglobin: 9.1 g/dL — ABNORMAL LOW (ref 12.0–15.0)
Immature Granulocytes: 3 %
Lymphocytes Relative: 21 %
Lymphs Abs: 0.8 10*3/uL (ref 0.7–4.0)
MCH: 27.6 pg (ref 26.0–34.0)
MCHC: 31.1 g/dL (ref 30.0–36.0)
MCV: 88.8 fL (ref 80.0–100.0)
Monocytes Absolute: 0.4 10*3/uL (ref 0.1–1.0)
Monocytes Relative: 11 %
Neutro Abs: 2.4 10*3/uL (ref 1.7–7.7)
Neutrophils Relative %: 60 %
Platelets: 390 10*3/uL (ref 150–400)
RBC: 3.3 MIL/uL — ABNORMAL LOW (ref 3.87–5.11)
RDW: 17.5 % — ABNORMAL HIGH (ref 11.5–15.5)
WBC: 3.9 10*3/uL — ABNORMAL LOW (ref 4.0–10.5)
nRBC: 1.3 % — ABNORMAL HIGH (ref 0.0–0.2)

## 2018-07-24 LAB — FIBRINOGEN: Fibrinogen: 710 mg/dL — ABNORMAL HIGH (ref 210–475)

## 2018-07-24 LAB — D-DIMER, QUANTITATIVE: D-Dimer, Quant: 2.05 ug/mL-FEU — ABNORMAL HIGH (ref 0.00–0.50)

## 2018-07-24 LAB — COMPREHENSIVE METABOLIC PANEL
ALT: 14 U/L (ref 0–44)
AST: 15 U/L (ref 15–41)
Albumin: 2.4 g/dL — ABNORMAL LOW (ref 3.5–5.0)
Alkaline Phosphatase: 62 U/L (ref 38–126)
Anion gap: 5 (ref 5–15)
BUN: 36 mg/dL — ABNORMAL HIGH (ref 8–23)
CO2: 27 mmol/L (ref 22–32)
Calcium: 8.7 mg/dL — ABNORMAL LOW (ref 8.9–10.3)
Chloride: 104 mmol/L (ref 98–111)
Creatinine, Ser: 1.1 mg/dL — ABNORMAL HIGH (ref 0.44–1.00)
GFR calc Af Amer: >60 mL/min (ref >60)
GFR calc non Af Amer: 53 mL/min — ABNORMAL LOW (ref >60)
Glucose, Bld: 130 mg/dL — ABNORMAL HIGH (ref 70–99)
Potassium: 4 mmol/L (ref 3.5–5.1)
Sodium: 136 mmol/L (ref 135–145)
Total Bilirubin: 0.1 mg/dL — ABNORMAL LOW (ref 0.3–1.2)
Total Protein: 5.8 g/dL — ABNORMAL LOW (ref 6.5–8.1)

## 2018-07-24 LAB — PROCALCITONIN: Procalcitonin: 0.14 ng/mL

## 2018-07-24 LAB — GLUCOSE, CAPILLARY
Glucose-Capillary: 108 mg/dL — ABNORMAL HIGH (ref 70–99)
Glucose-Capillary: 151 mg/dL — ABNORMAL HIGH (ref 70–99)
Glucose-Capillary: 157 mg/dL — ABNORMAL HIGH (ref 70–99)
Glucose-Capillary: 215 mg/dL — ABNORMAL HIGH (ref 70–99)

## 2018-07-24 LAB — C-REACTIVE PROTEIN: CRP: 2.3 mg/dL — ABNORMAL HIGH (ref <1.0)

## 2018-07-24 LAB — MAGNESIUM: Magnesium: 1.7 mg/dL (ref 1.7–2.4)

## 2018-07-24 LAB — FERRITIN: Ferritin: 203 ng/mL (ref 11–307)

## 2018-07-24 MED ORDER — ENOXAPARIN SODIUM 40 MG/0.4ML ~~LOC~~ SOLN
40.0000 mg | Freq: Every day | SUBCUTANEOUS | Status: DC
  Administered 2018-07-24 – 2018-07-26 (×3): 40 mg via SUBCUTANEOUS
  Filled 2018-07-24 (×3): qty 0.4

## 2018-07-24 MED ORDER — INSULIN GLARGINE 100 UNIT/ML ~~LOC~~ SOLN
12.0000 [IU] | Freq: Two times a day (BID) | SUBCUTANEOUS | Status: DC
  Administered 2018-07-24 – 2018-07-26 (×4): 12 [IU] via SUBCUTANEOUS
  Filled 2018-07-24 (×5): qty 0.12

## 2018-07-24 NOTE — Progress Notes (Addendum)
[facility] PROGRESS NOTE    Progress Note  Kara Hanson  IWL:[id number] DOB: 21-Jan-1956 DOA: 07/20/2018 PCP: Kara Rakes, MD     Brief Narrative:   Kara Hanson is an 63 y.o. female past medical history diabetes mellitus type 2, chronic diastolic heart failure who was recently in [place] where she was in the ICU for which she was treated with a course of Levaquin for about 2 months subsequently went to rehab, came to [place] tested positive on 07/12/2018 on admission patient was sleepy and hard to arouse.  Also per report patient had a direct exposure to 1 of her caregivers who tested positive for corona virus on 07/12/2018.  About 6 days prior to admission she had a sore throat dry cough without emesis.  She presented to the ED with hypoxia at 12 L of high flow nasal cannula She was found to have a temperature of 100.9 requiring 3 L of oxygen  Imaging: Chest x-ray done on 07/20/2018 show apparent infiltrate in both lower lung zones.  Assessment/Plan:   Acute respiratory failure with hypoxia due to Pneumonia due to COVID-19 virus: She is satting greater than 96% on 2 L of oxygen. Troponin trending down CRP 17.7 >10.5>2.3 ESR 135 D-dimer 2.05>>2.05 Fibrinogen 800 >670>710 Interleukin-6 was 42 Procalcitonin is decreasing now 0.14 Lymphopenia has resolved.   Although she has remained leukopenic She relates her shortness of breath is about the same Increase her Lasix to 3 times daily, fluid restrict her, she appears volume overloaded on physical exam. Consult physical therapy. When she was ambulated her saturation on room air decreased to 75%. Before that she was satting 94% on room air, the nurse ambulated her with oxygen and she remained at 95% with 1 L of oxygen.  Acute kidney injury on chronic kidney disease stage III: Baseline creatinine of ~1.1, her Lasix and ARB were held on admission. Increase her Lasix to 3 times daily, she has positive hepatojugular  reflux and crackles at bases bilaterally.  Acute on chronic systolic heart failure: With an EF of greater than 17% grade 2 diastolic heart failure from 03/2018. Cont coreg,strict I and O's, fluids restriction. Fluid overloaded on physical exam, cont Iv lasix. Continue to monitor potassium closely and replete as needed. Her creatinine continues to improve nicely, going back to her chart her baseline creatinine is 1.4-1.6 today she is 1.1.  Elevated cardiac biomarkers: Troponins are trending down.    Diabetes mellitus type 2: Cont.ew long-acting insulin plus sliding scale.  Essential hypertension Fairly controlled, will restart Coreg.   DVT prophylaxis: lovenox Family Communication:none Disposition Plan/Barrier to D/C: Unable to determine Code Status:     Code Status Orders  (From admission, onward)         Start     Ordered   07/20/18 1655  Full code  Continuous     07/20/18 1655        Code Status History    Date Active Date Inactive Code Status Order ID Comments User Context   03/20/2018 0402 03/24/2018 1949 Full Code [id number]  Norval Morton, MD ED   11/27/2017 0705 11/30/2017 1625 Full Code [id number]  Radene Gunning, NP ED   10/01/2016 0033 10/01/2016 1946 Full Code [id number]  Maryellen Pile, MD ED   06/17/2016 1720 06/18/2016 2022 Full Code [id number]  Reyne Dumas, MD ED   04/23/2016 0418 04/26/2016 1716 Full Code [id number]  Rise Patience, MD ED        IV Access:  Peripheral IV   Procedures and diagnostic studies:   No results found.   Medical Consultants:    None.  Anti-Infectives:   None  Subjective:    Kara Hanson she relates her breathing is significantly improved compared to yesterday.   Objective:    Vitals:   07/23/18 2100 07/24/18 0000 07/24/18 0400 07/24/18 0500  BP:    (!) 152/64  Pulse:      Resp:      Temp: 98 F (36.7 C) 98.1 F (36.7 C) 98.9 F (37.2 C)   TempSrc: Oral Oral Oral   SpO2:      Weight:       Height:        Intake/Output Summary (Last 24 hours) at 07/24/2018 0737 Last data filed at 07/24/2018 0245 Gross per 24 hour  Intake 670 ml  Output 3350 ml  Net -2680 ml   Filed Weights   07/20/18 1328  Weight: 68 kg    Exam: General exam: In no acute distress lying comfortably in bed. Respiratory system: Good air movement with crackles at the right lung base. Cardiovascular system: She has a regular rate and rhythm with positive swelling and S2, she has a positive hepatojugular reflux. Gastrointestinal system: Positive bowel sounds soft nontender nondistended. Central nervous system: Awake alert and oriented x3 nonfocal. Extremities: Trace lower extremity edema Skin: N no rashes or ulceration. Psychiatry: Mood and affect are appropriate, she is in a joyful mood.     Data Reviewed:    Labs: Basic Metabolic Panel: Recent Labs  Lab 07/20/18 1340 07/20/18 1357 07/20/18 1803 07/21/18 0614 07/22/18 0630 07/23/18 0400 07/24/18 0410  NA 132* 131*  --  134* 134* 137 136  K 3.3* 3.4*  --  3.7 3.6 4.2 4.0  CL 98  --   --  102 102 106 104  CO2 20*  --   --  _0 GLUCOSE 189*  --   --  168* 106* 104* 130*  BUN 41*  --   --  48* 50* 45* 36*  CREATININE 2.03*  --  2.01* 2.06* 1.72* 1.43* 1.10*  CALCIUM 8.5*  --   --  8.3* 8.7* 8.8* 8.7*  MG  --   --   --   --  1.9 1.7 1.7   GFR Estimated Creatinine Clearance: 45 mL/min (A) (by C-G formula based on SCr of 1.1 mg/dL (H)). Liver Function Tests: Recent Labs  Lab 07/20/18 1340 07/21/18 0614 07/22/18 0630 07/23/18 0400 07/24/18 0410  AST _1 ALT _2 ALKPHOS 81 65 71 63 62  BILITOT 0.5 0.4 0.4 0.2* 0.1*  PROT 6.5 5.7* 6.0* 5.7* 5.8*  ALBUMIN 2.7* 2.2* 2.4* 2.3* 2.4*   No results for input(s): LIPASE, AMYLASE in the last 168 hours. No results for input(s): AMMONIA in the last 168 hours. Coagulation profile No results for input(s): INR, PROTIME in the last 168 hours.  CBC: Recent  Labs  Lab 07/20/18 1340  07/20/18 1803 07/21/18 0614 07/22/18 0630 07/23/18 0400 07/24/18 0410  WBC 7.9  --  6.7 7.0 3.7* 3.3* 3.9*  NEUTROABS 6.4  --   --  6.0 2.5 1.9 2.4  HGB 9.4*   < > 8.7*  8.0* 8.2* 8.5* 8.5* 9.1*  HCT 30.1*   < > 29.1* 26.5* 27.7* 27.1* 29.3*  MCV 89.3  --  90.4 90.8 89.6 89.1 88.8  PLT 316  --  287 268 298  341 390   < > = values in this interval not displayed.   Cardiac Enzymes: Recent Labs  Lab 07/20/18 1340 07/20/18 1803 07/21/18 0043 07/21/18 0614  TROPONINI 1.30* 1.27* 0.83* 0.70*   BNP (last 3 results) No results for input(s): PROBNP in the last 8760 hours. CBG: Recent Labs  Lab 07/22/18 2128 07/23/18 0813 07/23/18 1144 07/23/18 1652 07/23/18 2019  GLUCAP 155* 93 172* 187* 209*   D-Dimer: Recent Labs    07/23/18 0400 07/24/18 0410  DDIMER 1.97* 2.05*   Hgb A1c: No results for input(s): HGBA1C in the last 72 hours. Lipid Profile: No results for input(s): CHOL, HDL, LDLCALC, TRIG, CHOLHDL, LDLDIRECT in the last 72 hours. Thyroid function studies: No results for input(s): TSH, T4TOTAL, T3FREE, THYROIDAB in the last 72 hours.  Invalid input(s): FREET3 Anemia work up: Recent Labs    07/23/18 0400 07/24/18 0410  FERRITIN 234 203   Sepsis Labs: Recent Labs  Lab 07/20/18 1340 07/20/18 1803 07/21/18 0614 07/22/18 0630 07/23/18 0400 07/24/18 0410  PROCALCITON  --  1.02  --  0.50 0.32 0.14  WBC 7.9 6.7 7.0 3.7* 3.3* 3.9*  LATICACIDVEN 1.2  --   --   --   --   --    Microbiology Recent Results (from the past 240 hour(s))  Culture, blood (routine x 2)     Status: None (Preliminary result)   Collection Time: 07/20/18  1:40 PM  Result Value Ref Range Status   Specimen Description   Final    BLOOD LEFT ANTECUBITAL Performed at [facility], [place] [address] [place], [place] [address]    Special Requests   Final    BOTTLES DRAWN AEROBIC AND ANAEROBIC Blood Culture adequate volume Performed at [place] [address] [place], [place] [address]    Culture   Final    NO GROWTH 3 DAYS Performed at [facility], [place] [address], [place], [place] [address]    Report Status PENDING  Incomplete  Culture, blood (routine x 2)     Status: None (Preliminary result)   Collection Time: 07/20/18  2:20 PM  Result Value Ref Range Status   Specimen Description   Final    BLOOD RIGHT ANTECUBITAL Performed at [place] [address], [place], [place] [address]    Special Requests   Final    BOTTLES DRAWN AEROBIC AND ANAEROBIC Blood Culture adequate volume Performed at [place] [address], [place], [place] [address]    Culture   Final    NO GROWTH 3 DAYS Performed at [facility], [place] [address] [place], [place] [address]    Report Status PENDING  Incomplete  MRSA PCR Screening     Status: None   Collection Time: 07/21/18  3:12 AM  Result Value Ref Range Status   MRSA by PCR NEGATIVE NEGATIVE Final    Comment:        The GeneXpert MRSA Assay (FDA approved for NASAL specimens only), is one component of a comprehensive MRSA colonization surveillance program. It is not intended to diagnose MRSA infection nor to guide or monitor treatment for MRSA infections. Performed at [facility], [place] [address], [place], [place] [address]      Medications:   . allopurinol  300 mg Oral Daily  . aspirin EC  81 mg Oral Daily  . carvedilol  6.25 mg Oral BID WC  . enoxaparin (LOVENOX) injection  30 mg Subcutaneous Daily  . furosemide  40 mg Intravenous Q8H  . insulin aspart  0-5 Units Subcutaneous QHS  . insulin aspart  0-9 Units Subcutaneous TID WC  . insulin glargine  20 Units Subcutaneous QHS  . pantoprazole  40 mg Oral Daily  . sodium chloride flush  3 mL Intravenous Q12H   Continuous Infusions: . sodium chloride       LOS: 4 days   Charlynne Cousins  [facility]   07/24/2018, 7:37 AM

## 2018-07-24 NOTE — Progress Notes (Signed)
SATURATION QUALIFICATIONS: (This note is used to comply with regulatory documentation for home oxygen)  Patient Saturations on Room Air at Rest = 94%  Patient Saturations on Room Air while Ambulating = 75%  Patient Saturations on 1 Liter of oxygen while Ambulating = 95  Patient does not tolerate ambulating without oxygen. She dropped from 95% 1 liter to 75% on RA

## 2018-07-24 NOTE — TOC Progression Note (Signed)
Transition of Care [facility]) - Progression Note    Patient Details  Name: Kara Hanson MRN: [id number] Date of Birth: 1956-01-27  Transition of Care [facility]) CM/SW Contact  Kara Grayer Beverely Pace, RN Phone Number: 07/24/2018, 12:28 PM  Clinical Narrative:   Patient is improving and CM has spoken with her concerning plan for discharge. Choice offered for Home Health agency, referral called to Adela Lank, [facility] Liaison. Patient says she will go home with her son to recover, doesn't remember his address. CM has been trying to contact daughter to get address. Will continue to follow.          Expected Discharge Plan and Services Case manager has spoken with patient's daughter, Kara Hanson. She states her mom will go to her brother and sister-in-laws home: Kara Hanson, Kara Hanson, [place], [place] [address],  [phone]    Case manager has received request to arrange for oxygen for home and for a wheelchair. CM will contact Magda Paganini with [employer] when orders are placed.      Expected Discharge Date: (unknown)                         Social Determinants of Health (SDOH) Interventions    Readmission Risk Interventions No flowsheet data found.

## 2018-07-24 NOTE — Progress Notes (Signed)
Contacted patient's daughter, [name]. Gave her updates on patient's condition and answered questions / concerns regarding her mother's condition.  Patient's daughter voiced concerns about her mother's oxygen saturation and feels that she has needed home oxygen for quite sometime as her mother stays short of breath according to the daughter. She also voiced concerns about her mother having a wheelchair at home.  Told her I would speak with case management about her concerns.

## 2018-07-24 NOTE — Progress Notes (Signed)
Physical Therapy Treatment Patient Details Name: Kara Hanson MRN: [id number] DOB: [birth year] Today's Date: 07/24/2018    History of Present Illness 63 y.o. female past medical history diabetes mellitus type 2, chronic diastolic heart failure who was recently in [place] where she was in the ICU for which she was treated with a course of Levaquin for about 2 months subsequently went to rehab, came to [place] tested positive for Covid 19 on 07/12/2018  Also per report patient had a direct exposure to 1 of her caregivers who tested positive for Covid on 07/12/2018;  She presented to the ED with hypoxia at 12 L of high flow nasal cannula    PT Comments    The patient is 94% on 1 L at rest. Assisted to [facility] with sats noted to drop to 79%. Replaced on 2 liters to ambulate with sats >90%. Patient's mobility is improving well, requires oxygen currently to maintain sats./Continue PT.  Follow Up Recommendations  Home health PT     Equipment Recommendations  Wheelchair (measurements PT)    Recommendations for Other Services       Precautions / Restrictions Precautions Precaution Comments: monitor VS ( pt reported hx of legs "giving way"), sats drop on RA    Mobility  Bed Mobility   Bed Mobility: Supine to Sit     Supine to sit: Min assist;HOB elevated     General bed mobility comments: assist with trunk, incr time and effort  Transfers   Equipment used: Rolling walker (2 wheeled) Transfers: Sit to/from [facility] Sit to Stand: Min assist Stand pivot transfers: Min assist       General transfer comment: seady assist from bed and BSC  Ambulation/Gait Ambulation/Gait assistance: Min assist Gait Distance (Feet): 50 Feet(then 25') Assistive device: Rolling walker (2 wheeled) Gait Pattern/deviations: Step-through pattern     General Gait Details: slow steady gait with RW, turns self in room with RW   Stairs             Wheelchair Mobility     Modified Rankin (Stroke Patients Only)       Balance                                            Cognition Arousal/Alertness: Awake/alert                                            Exercises      General Comments        Pertinent Vitals/Pain Pain Assessment: No/denies pain    Home Living                      Prior Function            PT Goals (current goals can now be found in the care plan section) Progress towards PT goals: Progressing toward goals    Frequency    Min 3X/week      PT Plan Current plan remains appropriate    Co-evaluation              AM-PAC PT "6 Clicks" Mobility   Outcome Measure    Help needed moving from lying on your back to sitting on the side of a flat  bed without using bedrails?: A Little Help needed moving to and from a bed to a chair (including a wheelchair)?: A Little Help needed standing up from a chair using your arms (e.g., wheelchair or bedside chair)?: A Little Help needed to walk in hospital room?: A Little Help needed climbing 3-5 steps with a railing? : A Little 6 Click Score: 15    End of Session Equipment Utilized During Treatment: Oxygen Activity Tolerance: Patient tolerated treatment well Patient left: in chair;with call bell/phone within reach;with nursing/sitter in room Nurse Communication: Mobility status PT Visit Diagnosis: Unsteadiness on feet (R26.81);Muscle weakness (generalized) (M62.81)     Time: 1534-1600 PT Time Calculation (min) (ACUTE ONLY): 26 min  Charges:  $Gait Training: 23-37 mins                     [place] Pager [phone] Office [phone]    Claretha Cooper 07/24/2018, 4:12 PM

## 2018-07-25 DIAGNOSIS — R6510 Systemic inflammatory response syndrome (SIRS) of non-infectious origin without acute organ dysfunction: Secondary | ICD-10-CM

## 2018-07-25 HISTORY — DX: Systemic inflammatory response syndrome (sirs) of non-infectious origin without acute organ dysfunction: R65.10

## 2018-07-25 LAB — CBC WITH DIFFERENTIAL/PLATELET
Abs Immature Granulocytes: 0.26 10*3/uL — ABNORMAL HIGH (ref 0.00–0.07)
Basophils Absolute: 0 10*3/uL (ref 0.0–0.1)
Basophils Relative: 1 %
Eosinophils Absolute: 0.2 10*3/uL (ref 0.0–0.5)
Eosinophils Relative: 4 %
HCT: 31.2 % — ABNORMAL LOW (ref 36.0–46.0)
Hemoglobin: 9.6 g/dL — ABNORMAL LOW (ref 12.0–15.0)
Immature Granulocytes: 6 %
Lymphocytes Relative: 20 %
Lymphs Abs: 0.9 10*3/uL (ref 0.7–4.0)
MCH: 27.6 pg (ref 26.0–34.0)
MCHC: 30.8 g/dL (ref 30.0–36.0)
MCV: 89.7 fL (ref 80.0–100.0)
Monocytes Absolute: 0.4 10*3/uL (ref 0.1–1.0)
Monocytes Relative: 9 %
Neutro Abs: 2.9 10*3/uL (ref 1.7–7.7)
Neutrophils Relative %: 60 %
Platelets: 397 10*3/uL (ref 150–400)
RBC: 3.48 MIL/uL — ABNORMAL LOW (ref 3.87–5.11)
RDW: 17.4 % — ABNORMAL HIGH (ref 11.5–15.5)
WBC: 4.8 10*3/uL (ref 4.0–10.5)
nRBC: 1.5 % — ABNORMAL HIGH (ref 0.0–0.2)

## 2018-07-25 LAB — COMPREHENSIVE METABOLIC PANEL
ALT: 11 U/L (ref 0–44)
AST: 14 U/L — ABNORMAL LOW (ref 15–41)
Albumin: 2.8 g/dL — ABNORMAL LOW (ref 3.5–5.0)
Alkaline Phosphatase: 66 U/L (ref 38–126)
Anion gap: 10 (ref 5–15)
BUN: 35 mg/dL — ABNORMAL HIGH (ref 8–23)
CO2: 27 mmol/L (ref 22–32)
Calcium: 9.3 mg/dL (ref 8.9–10.3)
Chloride: 101 mmol/L (ref 98–111)
Creatinine, Ser: 1.05 mg/dL — ABNORMAL HIGH (ref 0.44–1.00)
GFR calc Af Amer: >60 mL/min (ref >60)
GFR calc non Af Amer: 56 mL/min — ABNORMAL LOW (ref >60)
Glucose, Bld: 90 mg/dL (ref 70–99)
Potassium: 3.8 mmol/L (ref 3.5–5.1)
Sodium: 138 mmol/L (ref 135–145)
Total Bilirubin: 0.2 mg/dL — ABNORMAL LOW (ref 0.3–1.2)
Total Protein: 6.4 g/dL — ABNORMAL LOW (ref 6.5–8.1)

## 2018-07-25 LAB — GLUCOSE, CAPILLARY
Glucose-Capillary: 100 mg/dL — ABNORMAL HIGH (ref 70–99)
Glucose-Capillary: 136 mg/dL — ABNORMAL HIGH (ref 70–99)
Glucose-Capillary: 187 mg/dL — ABNORMAL HIGH (ref 70–99)
Glucose-Capillary: 194 mg/dL — ABNORMAL HIGH (ref 70–99)
Glucose-Capillary: 216 mg/dL — ABNORMAL HIGH (ref 70–99)

## 2018-07-25 LAB — C-REACTIVE PROTEIN: CRP: 1.3 mg/dL — ABNORMAL HIGH (ref <1.0)

## 2018-07-25 LAB — CULTURE, BLOOD (ROUTINE X 2)
Culture: NO GROWTH
Culture: NO GROWTH
Special Requests: ADEQUATE
Special Requests: ADEQUATE

## 2018-07-25 LAB — D-DIMER, QUANTITATIVE: D-Dimer, Quant: 1.99 ug/mL-FEU — ABNORMAL HIGH (ref 0.00–0.50)

## 2018-07-25 LAB — MAGNESIUM: Magnesium: 1.5 mg/dL — ABNORMAL LOW (ref 1.7–2.4)

## 2018-07-25 LAB — FIBRINOGEN: Fibrinogen: 741 mg/dL — ABNORMAL HIGH (ref 210–475)

## 2018-07-25 LAB — FERRITIN: Ferritin: 207 ng/mL (ref 11–307)

## 2018-07-25 MED ORDER — ONDANSETRON HCL 4 MG PO TABS: 4.0000 mg | ORAL_TABLET | Freq: Four times a day (QID) | ORAL | 0 refills | Status: DC | PRN

## 2018-07-25 NOTE — Progress Notes (Signed)
OT Treatment Note  Completed education with pt regarding HEP. Discussed with daughter over the phone. Pt to conitnue with HEP and notify MD if pain continues. Recommend HHOT.     07/25/18 1300  OT Visit Information  Last OT Received On 07/25/18  History of Present Illness 63 y.o. female past medical history diabetes mellitus type 2, chronic diastolic heart failure who was recently in [place] where she was in the ICU for which she was treated with a course of Levaquin for about 2 months subsequently went to rehab, came to [place] tested positive for Covid 19 on 07/12/2018  Also per report patient had a direct exposure to 1 of her caregivers who tested positive for Covid on 07/12/2018;  She presented to the ED with hypoxia at 12 L of high flow nasal cannula  Precautions  Precautions Fall;Other (comment)  Pain Assessment  Pain Assessment Faces  Faces Pain Scale 2  Pain Location L shoulder with elevation  Pain Descriptors / Indicators Guarding;Grimacing  Pain Intervention(s) Limited activity within patient's tolerance  Cognition  Arousal/Alertness Awake/alert  Behavior During Therapy WFL for tasks assessed/performed  Overall Cognitive Status Within Functional Limits for tasks assessed  Upper Extremity Assessment  Upper Extremity Assessment LUE deficits/detail  LUE Deficits / Details L shoulder pain with ROM above 90 FF  ADL  General ADL Comments Educated on fall prevention strtegies. Pt verbalized understanidng.  Other Exercises  Other Exercises Scapular retraction x 10  Other Exercises shoulder scaption x 10 L   Other Exercises scapular protraction  Other Exercises FF in supine. self ROM  OT - End of Session  Equipment Utilized During Treatment Oxygen (1L)  Activity Tolerance Patient tolerated treatment well  Patient left in chair;with call bell/phone within reach  Nurse Communication Other (comment) (DC needs)  OT Assessment/Plan  OT Plan Discharge plan remains appropriate   OT Visit Diagnosis Unsteadiness on feet (R26.81);Muscle weakness (generalized) (M62.81);Pain  Pain - Right/Left Left  Pain - part of body Shoulder  OT Frequency (ACUTE ONLY) Min 2X/week  Follow Up Recommendations Home health OT;Supervision/Assistance - 24 hour  OT Equipment Tub/shower bench  AM-PAC OT "6 Clicks" Daily Activity Outcome Measure (Version 2)  Help from another person eating meals? 4  Help from another person taking care of personal grooming? 4  Help from another person toileting, which includes using toliet, bedpan, or urinal? 3  Help from another person bathing (including washing, rinsing, drying)? 3  Help from another person to put on and taking off regular upper body clothing? 3  Help from another person to put on and taking off regular lower body clothing? 3  6 Click Score 20  OT Goal Progression  Progress towards OT goals Goals met/education completed, patient discharged from OT (Continue twith HHOT)  Acute Rehab OT Goals  Patient Stated Goal to go home today  OT Goal Formulation With patient  Time For Goal Achievement 08/01/18  Potential to Achieve Goals Good  ADL Goals  Pt/caregiver will Perform Home Exercise Program Left upper extremity;With written HEP provided;Increased ROM  Additional ADL Goal #1 Pt will independently verbalize 3 strategies to reduce risk of falls  OT Time Calculation  OT Start Time (ACUTE ONLY) 1331  OT Stop Time (ACUTE ONLY) 1345  OT Time Calculation (min) 14 min  OT General Charges  $OT Visit 1 Visit  OT Treatments  $Therapeutic Exercise 8-22 mins  Maurie Boettcher, OT/L   Acute OT Clinical Specialist [place] Pager [phone] Office [phone]

## 2018-07-25 NOTE — Care Management (Signed)
Case manager spoke with patient's son Delfino Lovett [phone], he is aware that oxygen will be delivered to his home and he will notify us when it arrives and then come to [facility] to get his mom.

## 2018-07-25 NOTE — Progress Notes (Signed)
Occupational Therapy Evaluation Patient Details Name: Kara Hanson MRN: [id number] DOB: Aug 29, 1955 Today's Date: 07/25/2018    History of Present Illness 63 y.o. female past medical history diabetes mellitus type 2, chronic diastolic heart failure who was recently in [place] where she was in the ICU for which she was treated with a course of Levaquin for about 2 months subsequently went to rehab, came to [place] tested positive for Covid 19 on 07/12/2018  Also per report patient had a direct exposure to 1 of her caregivers who tested positive for Covid on 07/12/2018;  She presented to the ED with hypoxia at 12 L of high flow nasal cannula   Clinical Impression   PTA, pt reports she had returned to being independent with mobility and self care. Pt currently requires min A with ADL. Using the RW to mobility with S. Desats to 88 on RA. Maintains @ 94 on 2L. Pt complaining of L shoulder pian. Pt staes she has a history of cervical stenosis. AROM grossly WFL. Tender when palating anterior aspect of shoulder. Pt may be demonstrating symptoms consistent with supraspinatus tendonitis. Will issue HEP for pt and attempt to discuss with her daughter over the phone. If pt continues to have L shoulder pian, recommend follow up with MD.     Follow Up Recommendations  Home health OT;Supervision/Assistance - 24 hour(initially)    Equipment Recommendations  Tub/shower bench    Recommendations for Other Services       Precautions / Restrictions Precautions Precautions: Fall;Other (comment) Precaution Comments: monitor VS ( pt reported hx of legs "giving way"), sats drop      Mobility Bed Mobility                  Transfers Overall transfer level: Needs assistance Equipment used: Rolling walker (2 wheeled) Transfers: Sit to/from Stand Sit to Stand: Supervision              Balance Overall balance assessment: Needs assistance Sitting-balance support: Feet supported;Bilateral  upper extremity supported Sitting balance-Leahy Scale: Fair     Standing balance support: Bilateral upper extremity supported Standing balance-Leahy Scale: Poor Standing balance comment: needs external support from RW and therapist.                            ADL either performed or assessed with clinical judgement   ADL Overall ADL's : Needs assistance/impaired                                     Functional mobility during ADLs: Supervision/safety;Rolling walker General ADL Comments: Able to complete UB ADL with min A due to painful L shoulder. Requires min A for LB ADL. Pt staes her daughter/daughter in law will be able to assist.      Vision         Perception     Praxis      Pertinent Vitals/Pain Pain Assessment: Faces Faces Pain Scale: Hurts even more Pain Location: L shoulder with elevation Pain Descriptors / Indicators: Guarding;Grimacing Pain Intervention(s): Limited activity within patient's tolerance;Monitored during session;Repositioned     Hand Dominance Right   Extremity/Trunk Assessment Upper Extremity Assessment Upper Extremity Assessment: LUE deficits/detail LUE Deficits / Details: Complains of pain anterior portion of shoulder. Tender with palpation. Increased complants of discomfort with empty can test; stregth @ 3+/5 but imcreased complaints of pain - ?  impingement. Able to reach behind head and L SI area. Pt staes she has a history of cervical stenosis  LUE Coordination: decreased fine motor;decreased gross motor(tremor present)   Lower Extremity Assessment Lower Extremity Assessment: Defer to PT evaluation   Cervical / Trunk Assessment Cervical / Trunk Assessment: Normal   Communication     Cognition Arousal/Alertness: Awake/alert Behavior During Therapy: WFL for tasks assessed/performed Overall Cognitive Status: No family/caregiver present to determine baseline cognitive functioning                                  General Comments: Slow processing; most likely at baseline   General Comments       Exercises Exercises: Other exercises General Exercises - Upper Extremity Shoulder Flexion: (pain with left shoulder elevation, tried 90 degrees-pain) Elbow Flexion: (no pain with these.) Other Exercises Other Exercises: Scapular retraction x 10 Other Exercises: shoulder scaption x 10 L    Shoulder Instructions      Home Living Family/patient expects to be discharged to:: Private residence Living Arrangements: Children Available Help at Discharge: Family;Available PRN/intermittently Type of Home: House Home Access: Stairs to enter [employer] of Steps: 4 Entrance Stairs-Rails: Right Home Layout: One level     Bathroom Shower/Tub: Teacher, early years/pre: Standard Bathroom Accessibility: Yes How Accessible: Accessible via walker Home Equipment: Walker - 2 wheels   Additional Comments: States they are getting a tub bench      Prior Functioning/Environment Level of Independence: Needs assistance;Independent with assistive device(s)        Comments: Pt staes prior to getting sick that she was independent wtih self care and mobility        OT Problem List: Decreased strength;Impaired balance (sitting and/or standing);Decreased range of motion;Decreased activity tolerance;Decreased coordination;Decreased safety awareness;Decreased knowledge of use of DME or AE;Cardiopulmonary status limiting activity;Pain;Impaired UE functional use      OT Treatment/Interventions: Self-care/ADL training;Therapeutic exercise;Energy conservation;DME and/or AE instruction;Therapeutic activities;Cognitive remediation/compensation;Patient/family education;Balance training    OT Goals(Current goals can be found in the care plan section) Acute Rehab OT Goals Patient Stated Goal: to go home today OT Goal Formulation: With patient Time For Goal Achievement: 08/01/18 Potential  to Achieve Goals: Good  OT Frequency: Min 2X/week   Barriers to D/C:            Co-evaluation              AM-PAC OT "6 Clicks" Daily Activity     Outcome Measure Help from another person eating meals?: None Help from another person taking care of personal grooming?: None Help from another person toileting, which includes using toliet, bedpan, or urinal?: A Little Help from another person bathing (including washing, rinsing, drying)?: A Little Help from another person to put on and taking off regular upper body clothing?: A Little Help from another person to put on and taking off regular lower body clothing?: A Little 6 Click Score: 20   End of Session Equipment Utilized During Treatment: Oxygen(1L) Nurse Communication: Mobility status;Other (comment)(DC needs)  Activity Tolerance: Patient tolerated treatment well Patient left: in chair;with call bell/phone within reach  OT Visit Diagnosis: Unsteadiness on feet (R26.81);Muscle weakness (generalized) (M62.81);Pain Pain - Right/Left: Left Pain - part of body: Shoulder                Time: 1115-1140 OT Time Calculation (min): 25 min Charges:  OT General Charges $OT Visit: 1  Visit OT Evaluation $OT Eval Moderate Complexity: 1 Mod OT Treatments $Self Care/Home Management : 8-22 mins  Maurie Boettcher, OT/L   Acute OT Clinical Specialist Acute Rehabilitation Services Pager [phone] Office [phone]   [facility] 07/25/2018, 12:10 PM

## 2018-07-25 NOTE — Progress Notes (Signed)
  07/25/2018  SATURATION QUALIFICATIONS: (This note is used to comply with regulatory documentation for home oxygen)  Patient Saturations on Room Air at Rest = 92%  Patient Saturations on Room Air while Ambulating = 78%  Patient Saturations on 2 Liters of oxygen while Ambulating = 92%  Please briefly explain why patient needs home oxygen: Pt requires supplemental O2 to maintain sats during mobility.  Barbarann Ehlers Jemar Paulsen, PT, DPT  Acute Rehabilitation [phone] pager [phone]) [phone] office

## 2018-07-25 NOTE — TOC Progression Note (Signed)
Transition of Care [facility]) - Progression Note    Patient Details  Name: Kara Hanson MRN: [id number] Date of Birth: 06/01/1955  Transition of Care [facility]) CM/SW Contact  Loletha Grayer Beverely Pace, RN Phone Number: 07/25/2018, 11:24 AM  Clinical Narrative:   63 yr old female Covid 26 Positive. Case manager has spoken with patient and her daughter concerning discharge plan.  Case manager spoke with patient's daughter, Kara Hanson who states her mom will go home with her brother Kara Hanson, [place]. [place], [place] [address]. [name] [phone] is contact person to arrange for therapy visits. CM has contacted Magda Paganini with [employer] at 11:15AM to request oxygen and wheelchair for patient. AC will get tank from DME room at [facility] for transport Home. CM has left message for patient's daughter and daughter-in-law to discuss discharge. Will continue to monitor.      Expected Discharge Plan: [place] Barriers to Discharge: No Barriers Identified  Expected Discharge Plan and Services Expected Discharge Plan: [place]   Discharge Planning Services: CM Consult Post Acute Care Choice: Durable Medical Equipment, Home Health Living arrangements for the past 2 months: Single Family Home Expected Discharge Date: 07/25/18               DME Arranged: Oxygen, Wheelchair manual DME Agency: [facility] HH Arranged: PT, OT [place] Agency: [place]   Social Determinants of Health (SDOH) Interventions    Readmission Risk Interventions No flowsheet data found.

## 2018-07-25 NOTE — Discharge Instructions (Signed)
- Based on the CDC's non-test criteria for ending self-isolation: You may not return to work/leave the home until at least 7 days since symptom onset AND 3 days without a fever (without taking tylenol, ibuprofen, etc.) AND have improvement in respiratory symptoms.  Directions for you at home:  Wear a facemask You should wear a facemask that covers your nose and mouth when you are in the same room with other people and when you visit a healthcare provider. People who live with or visit you should also wear a facemask while they are in the same room with you.  Separate yourself from other people in your home As much as possible, you should stay in a different room from other people in your home. Also, you should use a separate bathroom, if available.  Avoid sharing household items You should not share dishes, drinking glasses, cups, eating utensils, towels, bedding, or other items with other people in your home. After using these items, you should wash them thoroughly with soap and water.  Cover your coughs and sneezes Cover your mouth and nose with a tissue when you cough or sneeze, or you can cough or sneeze into your sleeve. Throw used tissues in a lined trash can, and immediately wash your hands with soap and water for at least 20 seconds or use an alcohol-based hand rub.  Wash your [facility] your hands often and thoroughly with soap and water for at least 20 seconds. You can use an alcohol-based hand sanitizer if soap and water are not available and if your hands are not visibly dirty. Avoid touching your eyes, nose, and mouth with unwashed hands.  Directions for those who live with, or provide care at home for you:  Limit the number of people who have contact with the patient  If possible, have only one caregiver for the patient.  Other household members should stay in another home or place of residence. If this is not possible, they should stay  in another room, or be  separated from the patient as much as possible. Use a separate bathroom, if available.  Restrict visitors who do not have an essential need to be in the home.  Ensure good ventilation Make sure that shared spaces in the home have good air flow, such as from an air conditioner or an opened window, weather permitting.  Wash your hands often  Wash your hands often and thoroughly with soap and water for at least 20 seconds. You can use an alcohol based hand sanitizer if soap and water are not available and if your hands are not visibly dirty.  Avoid touching your eyes, nose, and mouth with unwashed hands.  Use disposable paper towels to dry your hands. If not available, use dedicated cloth towels and replace them when they become wet.  Wear a facemask and gloves  Wear a disposable facemask at all times in the room and gloves when you touch or have contact with the patients blood, body fluids, and/or secretions or excretions, such as sweat, saliva, sputum, nasal mucus, vomit, urine, or feces.  Ensure the mask fits over your nose and mouth tightly, and do not touch it during use.  Throw out disposable facemasks and gloves after using them. Do not reuse.  Wash your hands immediately after removing your facemask and gloves.  If your personal clothing becomes contaminated, carefully remove clothing and launder. Wash your hands after handling contaminated clothing.  Place all used disposable facemasks, gloves, and other waste in  a lined container before disposing them with other household waste.  Remove gloves and wash your hands immediately after handling these items.  Do not share dishes, glasses, or other household items with the patient  Avoid sharing household items. You should not share dishes, drinking glasses, cups, eating utensils, towels, bedding, or other items with a patient who is confirmed to have, or being evaluated for, COVID-19 infection.  After the person uses these items,  you should wash them thoroughly with soap and water.  Wash laundry thoroughly  Immediately remove and wash clothes or bedding that have blood, body fluids, and/or secretions or excretions, such as sweat, saliva, sputum, nasal mucus, vomit, urine, or feces, on them.  Wear gloves when handling laundry from the patient.  Read and follow directions on labels of laundry or clothing items and detergent. In general, wash and dry with the warmest temperatures recommended on the label.  Clean all areas the individual has used often  Clean all touchable surfaces, such as counters, tabletops, doorknobs, bathroom fixtures, toilets, phones, keyboards, tablets, and bedside tables, every day. Also, clean any surfaces that may have blood, body fluids, and/or secretions or excretions on them.  Wear gloves when cleaning surfaces the patient has come in contact with.  Use a diluted bleach solution (e.g., dilute bleach with 1 part bleach and 10 parts water) or a household disinfectant with a label that says EPA-registered for coronaviruses. To make a bleach solution at home, add 1 tablespoon of bleach to 1 quart (4 cups) of water. For a larger supply, add  cup of bleach to 1 gallon (16 cups) of water.  Read labels of cleaning products and follow recommendations provided on product labels. Labels contain instructions for safe and effective use of the cleaning product including precautions you should take when applying the product, such as wearing gloves or eye protection and making sure you have good ventilation during use of the product.  Remove gloves and wash hands immediately after cleaning.  Monitor yourself for signs and symptoms of illness Caregivers and household members are considered close contacts, should monitor their health, and will be asked to limit movement outside of the home to the extent possible. Follow the monitoring steps for close contacts listed on the symptom monitoring form.

## 2018-07-25 NOTE — Progress Notes (Signed)
Physical Therapy Treatment Patient Details Name: Kara Hanson MRN: [id number] DOB: 1955-12-20 Today's Date: 07/25/2018    History of Present Illness 63 y.o. female past medical history diabetes mellitus type 2, chronic diastolic heart failure who was recently in [place] where she was in the ICU for which she was treated with a course of Levaquin for about 2 months subsequently went to rehab, came to [place] tested positive for Covid 19 on 07/12/2018  Also per report patient had a direct exposure to 1 of her caregivers who tested positive for Covid on 07/12/2018;  She presented to the ED with hypoxia at 12 L of high flow nasal cannula    PT Comments    Pt was able to get up OOB to chair.  She still desats with O2 on to 81% on 1L even with short boughts of mobility (see separate O2 note).  She did well with RW, has one at home and will need a WC for mobility around the house initially both due to weakness and drop in sats with mobility.  PT will continue to follow acutely for safe mobility progression.  I recommended OT consult to look at L shoulder (she says it did not hurt prior to hospitalization).  PT will continue to follow acutely for safe mobility progression  Follow Up Recommendations  Home health PT     Equipment Recommendations  Wheelchair (measurements PT)    Recommendations for Other Services   NA     Precautions / Restrictions Precautions Precautions: Fall;Other (comment) Precaution Comments: monitor VS ( pt reported hx of legs "giving way"), sats drop    Mobility  Bed Mobility Overal bed mobility: Needs Assistance Bed Mobility: Supine to Sit     Supine to sit: Supervision;HOB elevated     General bed mobility comments: supervision for safety.  HOB mildly elevated, pt using bed rail to help lift her trunk.   Transfers Overall transfer level: Needs assistance Equipment used: Rolling walker (2 wheeled) Transfers: Sit to/from Stand Sit to Stand: Min assist          General transfer comment: Minj assist to support trunk over weak legs with RW.    Ambulation/Gait Ambulation/Gait assistance: Min assist Gait Distance (Feet): 5 Feet Assistive device: Rolling walker (2 wheeled) Gait Pattern/deviations: Step-through pattern;Shuffle     General Gait Details: Pt needed min assist to support trunk for balance and stability over weak legs.  Pt went to recliner chair to review HEP for d/c home today. O2 sats dropped to 81% on 1 L O2 [place] [facility] [place] during mobility. Rebounded quickly with seated rest.           Balance Overall balance assessment: Needs assistance Sitting-balance support: Feet supported;Bilateral upper extremity supported Sitting balance-Leahy Scale: Fair     Standing balance support: Bilateral upper extremity supported Standing balance-Leahy Scale: Poor Standing balance comment: needs external support from RW and therapist.                             Cognition Arousal/Alertness: Awake/alert Behavior During Therapy: WFL for tasks assessed/performed                                   General Comments: Cognition not specifically tested.  Conversation normal.       Exercises General Exercises - Upper Extremity Shoulder Flexion: AROM;Both;10 reps(pain with left shoulder elevation, tried  90 degrees-pain) Elbow Flexion: AROM;Both;10 reps(no pain with these.) General Exercises - Lower Extremity Long Arc Quad: AROM;Both;10 reps Hip Flexion/Marching: AROM;Both;10 reps Toe Raises: AROM;Both;10 reps Heel Raises: AROM;Both;10 reps        Pertinent Vitals/Pain Pain Assessment: Faces Faces Pain Scale: Hurts even more Pain Location: L shoulder with elevation Pain Descriptors / Indicators: Guarding;Grimacing Pain Intervention(s): Limited activity within patient's tolerance;Monitored during session;Repositioned           PT Goals (current goals can now be found in the care plan section) Acute Rehab PT  Goals Patient Stated Goal: to go home today Progress towards PT goals: Progressing toward goals    Frequency    Min 5X/week      PT Plan Frequency needs to be updated       AM-PAC PT "6 Clicks" Mobility   Outcome Measure  Help needed turning from your back to your side while in a flat bed without using bedrails?: A Little Help needed moving from lying on your back to sitting on the side of a flat bed without using bedrails?: A Little Help needed moving to and from a bed to a chair (including a wheelchair)?: A Little Help needed standing up from a chair using your arms (e.g., wheelchair or bedside chair)?: A Little Help needed to walk in hospital room?: A Little Help needed climbing 3-5 steps with a railing? : A Little 6 Click Score: 18    End of Session Equipment Utilized During Treatment: Oxygen Activity Tolerance: Patient tolerated treatment well Patient left: in chair;with call bell/phone within reach Nurse Communication: Mobility status;Other (comment)(pt does own a RW) PT Visit Diagnosis: Unsteadiness on feet (R26.81);Muscle weakness (generalized) (M62.81)     Time: 1020-1100(only charged 2 because of time printing HEP) PT Time Calculation (min) (ACUTE ONLY): 40 min  Charges:  $Therapeutic Exercise: 8-22 mins $Therapeutic Activity: 8-22 mins                    Wilberth Damon B. Gailyn Crook, PT, DPT  Acute Rehabilitation [phone] pager #[phone] office   07/25/2018, 11:20 AM

## 2018-07-25 NOTE — Discharge Summary (Signed)
Physician Discharge Summary  Kara Hanson YTK:[id number] DOB: 1956-01-11 DOA: 07/20/2018  PCP: [name], MD  Admit date: 07/20/2018 Discharge date: 07/25/2018  Admitted From: Home Disposition:  Home  Recommendations for Outpatient Follow-up:  1. Follow up with PCP in 1-2 weeks 2. Please obtain a basic metabolic panel at that time, make sure she is compliant with her medication and fluid restriction. 3. We will ambulate her and check to see if she requires further oxygen.  Home Health:Yes Equipment/Devices:Oxygen 2 L at home  Discharge Condition:Stable CODE STATUS:Full Diet recommendation: Heart Healthy  Brief/Interim Summary: Kara Hanson is an 63 y.o. female past medical history diabetes mellitus type 2, chronic diastolic heart failure who was recently in [place] where she was in the ICU for which she was treated with a course of Levaquin for about 2 months subsequently went to rehab, came to [place] tested positive on 07/12/2018 on admission patient was sleepy and hard to arouse.  Also per report patient had a direct exposure to 1 of her caregivers who tested positive for corona virus on 07/12/2018.  About 6 days prior to admission she had a sore throat dry cough without emesis.  She presented to the ED with hypoxia at 12 L of high flow nasal cannula She was found to have a temperature of 100.9 requiring 3 L of oxygen   Discharge Diagnoses:  Principal Problem:   Pneumonia due to COVID-19 virus Active Problems:   Hypertension   Gout   Acute kidney injury ([place])   Acute and chronic respiratory failure with hypoxia ([place])   Diabetes mellitus type 2 with complications, uncontrolled ([place])   CKD (chronic kidney disease), stage III ([place])   COVID-19 virus infection   Chronic diastolic CHF (congestive heart failure) ([place])  Acute respiratory failure with hypoxia due to COVID-19 pneumonia/Sirs: She was admitted to the hospital as she was de-satting she was placed on IV  oxygen, cardiac biomarkers were checked which were trended down. COVID-19 markers were followed which were improved. Her lymphopenia resolved, and her dyspnea started to improve. She was also started on IV Lasix and she diuresed well she was kept negative and seem euvolemic on physical exam. Physical therapy evaluated the patient and recommended home health PT. Patient was ambulated and her saturations dropped without oxygen to 75%. So she will go home on oxygen for 2 L.  Acute kidney injury on chronic kidney disease stage III: With a baseline creatinine around 1.1. Her ARB was held on admission. She was aggressively diuresed and she was fluid overloaded on physical exam. She diuresed about 5 L and remained about 5 L negative. She will go home on her current home dose of Lasix electrolytes were repleted as needed.  Acute on chronic diastolic heart failure: With an EF of 50% with grade 2 diastolic heart failure on 03/2018. Her Coreg was held on admission but it was resumed. She was fluid restricted and aggressively diuresed that she was fluid overloaded on physical exam. Her creatinine improved from 1.6-1.0 on the day of discharge. She will continue current home of Lasix, resume her ARB as an outpatient.  Elevated cardiac biomarkers: Likely demand ischemia she denied any chest pain her cardiac biomarkers trended down.  Diabetes mellitus type 2: No changes made to her medication.  Essential hypertension: With changes made to her medication. Discharge Instructions  Discharge Instructions    Diet - low sodium heart healthy   Complete by:  As directed    Increase activity slowly  Complete by:  As directed    MyChart COVID-19 home monitoring program   Complete by:  Jul 25, 2018    Is the patient willing to use a smartphone for remote monitoring via the MyChart app?:  Yes   Temperature monitoring   Complete by:  Jul 25, 2018    After how many days would you like to receive a  notification of this patient's flowsheet entries?:  1     Allergies as of 07/25/2018      Reactions   Garlic Shortness Of Breath, Itching, Swelling   Hand itching and swelling   Latex Itching   Morphine And Related Itching, Other (See Comments)   Headache   Other Itching   Reaction to newspaper ink -itching and headache      Medication List    TAKE these medications   allopurinol 300 MG tablet Commonly known as:  ZYLOPRIM TAKE 1 TABLET (300 MG TOTAL) BY MOUTH DAILY.   amLODipine 5 MG tablet Commonly known as:  NORVASC Take 1 tablet (5 mg total) by mouth at bedtime.   aspirin EC 81 MG tablet Take 81 mg by mouth daily.   atorvastatin 80 MG tablet Commonly known as:  LIPITOR Take 1 tablet (80 mg total) by mouth daily at 6 PM.   Basaglar KwikPen 100 UNIT/ML Sopn Inject 0.35 mLs (35 Units total) into the skin at bedtime.   carvedilol 12.5 MG tablet Commonly known as:  COREG Take 1 tablet (12.5 mg total) by mouth 2 (two) times daily with a meal.   dicyclomine 20 MG tablet Commonly known as:  BENTYL Take 1 tablet (20 mg total) by mouth 2 (two) times daily.   ergocalciferol 1.25 MG (50000 UT) capsule Commonly known as:  Drisdol Take 1 capsule (50,000 Units total) by mouth once a week. What changed:  additional instructions   ezetimibe 10 MG tablet Commonly known as:  ZETIA Take 1 tablet (10 mg total) by mouth daily.   furosemide 40 MG tablet Commonly known as:  LASIX Take 1 tablet (40 mg total) by mouth 2 (two) times daily.   gabapentin 300 MG capsule Commonly known as:  NEURONTIN Take 1 capsule (300 mg total) by mouth 2 (two) times daily.   glucose blood test strip Commonly known as:  ONE TOUCH ULTRA TEST UAD UP TO THREE TIMES DAILY DX E11.4   insulin aspart 100 UNIT/ML injection Commonly known as:  novoLOG 0 to 12 units subcutaneously 3 times daily before meals as per sliding scale   Insulin Pen Needle 31G X 5 MM Misc 1 each by Does not apply route at  bedtime.   Lancet Device Misc Use as instructed 3 times daily   lansoprazole 15 MG capsule Commonly known as:  PREVACID Take 1 capsule (15 mg total) by mouth daily.   losartan 25 MG tablet Commonly known as:  COZAAR Take 25 mg by mouth daily.   metoCLOPramide 5 MG tablet Commonly known as:  Reglan Take 1 tablet (5 mg total) by mouth 3 (three) times daily before meals.   omeprazole 20 MG capsule Commonly known as:  PRILOSEC Take 20 mg by mouth daily.   ondansetron 4 MG tablet Commonly known as:  ZOFRAN Take 1 tablet (4 mg total) by mouth every 6 (six) hours as needed for nausea.   ONE TOUCH ULTRA MINI w/Device Kit UAD UP TO THREE TIMES DAILY DX P95.0   OneTouch Delica Lancets 93O Misc UAD UP TO THREE TIMES DAILY DX E11.4  Super B Complex/C Caps Take 1 capsule by mouth daily.   tiZANidine 4 MG tablet Commonly known as:  Zanaflex Take 1 tablet (4 mg total) by mouth every 8 (eight) hours as needed for muscle spasms.            Durable Medical Equipment  (From admission, onward)         Start     Ordered   07/25/18 0952  For home use only DME oxygen  Once    Question Answer Comment  Mode or (Route) Nasal cannula   Liters per Minute 2   Frequency Continuous (stationary and portable oxygen unit needed)   Oxygen delivery system Gas      07/25/18 0951   07/24/18 1227  For home use only DME standard manual wheelchair with seat cushion  Once    Comments:  Patient suffers from  Generalized weakness, shortness of breath which impairs their ability to perform daily activities like ambulating in the home.  A cane will not resolve  issue with performing activities of daily living. A wheelchair will allow patient to safely perform daily activities. Patient can safely propel the wheelchair in the home or has a caregiver who can provide assistance.  Accessories: elevating leg rests (ELRs), wheel locks, extensions and anti-tippers. Needs seat and back cushion.   07/24/18  1228         Follow-up Information    [facility] Follow up.   Specialty:  Home Health Services Why:   representative from [facility] will contact you to arrange start date and time for your therapy. Contact information: [place] [address] [phone]          Allergies  Allergen Reactions  . Garlic Shortness Of Breath, Itching and Swelling    Hand itching and swelling  . Latex Itching  . Morphine And Related Itching and Other (See Comments)    Headache   . Other Itching    Reaction to newspaper ink -itching and headache    Consultations:  None   Procedures/Studies: Dg Chest Port 1 View  Result Date: 07/21/2018 CLINICAL DATA:  Respiratory failure EXAM: PORTABLE CHEST 1 VIEW COMPARISON:  July 20, 2018 FINDINGS: The heart, hila, and mediastinum are unchanged. No focal infiltrate on the right. Increasing opacity in the left mid lung. No pneumothorax. No other acute abnormalities. IMPRESSION: Increasing infiltrate in the left mid lung. No other acute abnormalities noted. Electronically Signed   By: Dorise Bullion III M.D   On: 07/21/2018 08:32   Dg Chest Portable 1 View  Result Date: 07/20/2018 CLINICAL DATA:  Shortness of breath.  Recent positive COVID-19 test EXAM: PORTABLE CHEST 1 VIEW COMPARISON:  March 20, 2018 FINDINGS: There are areas of airspace consolidation in both lung bases as well as in the left upper lobe. Heart is mildly enlarged with pulmonary vascularity normal. No adenopathy. No bone lesions. IMPRESSION: Areas of apparent pneumonia in both lower lung zones as well as in the left upper lobe. Lungs elsewhere are clear. Heart is mildly enlarged. No adenopathy evident. Electronically Signed   By: Lowella Grip III M.D.   On: 07/20/2018 14:47     Subjective: No complaints feels great her appetite has returned.  Discharge Exam: Vitals:   07/25/18 0757 07/25/18 0800  BP: (!) 154/75 (!) 154/75   Pulse: 79 82  Resp:  19  Temp:    SpO2:  100%     General exam: In no  acute distress Respiratory system: Good air movement she still has persistent crackles on her right lung. Cardiovascular system: Regular Rate with a positive S1-S2 negative JVD negative hepatojugular reflux Gastrointestinal system: Bowel sounds soft nontender nondistended. Central nervous system: Awake alert and oriented x3 nonfocal. Extremities: No lower edema Skin: No rashes or ulceration Psychiatry: Mood and affect are appropriate.     The results of significant diagnostics from this hospitalization (including imaging, microbiology, ancillary and laboratory) are listed below for reference.     Microbiology: Recent Results (from the past 240 hour(s))  Culture, blood (routine x 2)     Status: None (Preliminary result)   Collection Time: 07/20/18  1:40 PM  Result Value Ref Range Status   Specimen Description   Final    BLOOD LEFT ANTECUBITAL Performed at [facility] [address] [place], [place] [address]    Special Requests   Final    BOTTLES DRAWN AEROBIC AND ANAEROBIC Blood Culture adequate volume Performed at [facility] [address] [place], [place] [address]    Culture   Final    NO GROWTH 4 DAYS Performed at [facility], [place] [address] [place], [place] [address]    Report Status PENDING  Incomplete  Culture, blood (routine x 2)     Status: None (Preliminary result)   Collection Time: 07/20/18  2:20 PM  Result Value Ref Range Status   Specimen Description   Final    BLOOD RIGHT ANTECUBITAL Performed at [facility] [address] [place], [place] [address]    Special Requests   Final    BOTTLES DRAWN AEROBIC AND ANAEROBIC Blood Culture adequate volume Performed at [facility] [address] [place], [place] [address]    Culture   Final    NO GROWTH 4 DAYS Performed at [facility], [place] [address] [place], [place] [address]    Report Status PENDING  Incomplete  MRSA PCR Screening     Status: None   Collection Time: 07/21/18  3:12 AM  Result Value Ref Range Status   MRSA by PCR NEGATIVE NEGATIVE Final    Comment:        The GeneXpert MRSA Assay (FDA approved for NASAL specimens only), is one component of a comprehensive MRSA colonization surveillance program. It is not intended to diagnose MRSA infection nor to guide or monitor treatment for MRSA infections. Performed at [facility], [place] [address] [place], [place] [address]      Labs: BNP (last 3 results) Recent Labs    11/27/17 0111 03/20/18 0054 07/20/18 1340  BNP 59.5 218.7* 7,824.2*   Basic Metabolic Panel: Recent Labs  Lab 07/21/18 0614 07/22/18 0630 07/23/18 0400 07/24/18 0410 07/25/18 0500  NA 134* 134* 137 136 138  K 3.7 3.6 4.2 4.0 3.8  CL 102 102 106 104 101  CO2 _0 GLUCOSE 168* 106* 104* 130* 90  BUN 48* 50* 45* 36* 35*  CREATININE 2.06* 1.72* 1.43* 1.10* 1.05*  CALCIUM 8.3* 8.7* 8.8* 8.7* 9.3  MG  --  1.9 1.7 1.7 1.5*   Liver Function Tests: Recent Labs  Lab 07/21/18 0614 07/22/18 0630 07/23/18 0400 07/24/18 0410 07/25/18 0500  AST _1 14*  ALT _2 ALKPHOS 65 71 63 62 66  BILITOT 0.4 0.4 0.2* 0.1* 0.2*  PROT 5.7* 6.0* 5.7* 5.8* 6.4*  ALBUMIN 2.2*  2.4* 2.3* 2.4* 2.8*   No results for input(s): LIPASE, AMYLASE in the last 168 hours. No results for input(s): AMMONIA in the last 168 hours. CBC: Recent Labs  Lab 07/21/18 0614 07/22/18 0630 07/23/18 0400 07/24/18 0410 07/25/18 0500  WBC 7.0 3.7* 3.3* 3.9* 4.8  NEUTROABS 6.0 2.5 1.9 2.4 2.9  HGB 8.2* 8.5* 8.5* 9.1* 9.6*  HCT 26.5* 27.7* 27.1* 29.3* 31.2*  MCV 90.8 89.6 89.1 88.8 89.7  PLT 268 298 341 390 397   Cardiac Enzymes: Recent Labs  Lab 07/20/18 1340 07/20/18 1803 07/21/18 0043 07/21/18 0614  TROPONINI 1.30* 1.27* 0.83* 0.70*   BNP: Invalid  input(s): POCBNP CBG: Recent Labs  Lab 07/24/18 1142 07/24/18 1629 07/24/18 2110 07/25/18 0031 07/25/18 0739  GLUCAP 151* 157* 215* 136* 100*   D-Dimer Recent Labs    07/24/18 0410 07/25/18 0500  DDIMER 2.05* 1.99*   Hgb A1c No results for input(s): HGBA1C in the last 72 hours. Lipid Profile No results for input(s): CHOL, HDL, LDLCALC, TRIG, CHOLHDL, LDLDIRECT in the last 72 hours. Thyroid function studies No results for input(s): TSH, T4TOTAL, T3FREE, THYROIDAB in the last 72 hours.  Invalid input(s): FREET3 Anemia work up Recent Labs    07/24/18 0410 07/25/18 0500  FERRITIN 203 207   Urinalysis    Component Value Date/Time   COLORURINE YELLOW 01/02/2018 1452   APPEARANCEUR HAZY (A) 01/02/2018 1452   LABSPEC 1.039 (H) 01/02/2018 1452   PHURINE 6.0 01/02/2018 1452   GLUCOSEU >=500 (A) 01/02/2018 1452   HGBUR NEGATIVE 01/02/2018 1452   BILIRUBINUR NEGATIVE 01/02/2018 1452   BILIRUBINUR negative 12/13/2017 1514   KETONESUR NEGATIVE 01/02/2018 1452   PROTEINUR >=300 (A) 01/02/2018 1452   UROBILINOGEN 0.2 12/13/2017 1514   NITRITE NEGATIVE 01/02/2018 1452   LEUKOCYTESUR NEGATIVE 01/02/2018 1452   Sepsis Labs Invalid input(s): PROCALCITONIN,  WBC,  LACTICIDVEN Microbiology Recent Results (from the past 240 hour(s))  Culture, blood (routine x 2)     Status: None (Preliminary result)   Collection Time: 07/20/18  1:40 PM  Result Value Ref Range Status   Specimen Description   Final    BLOOD LEFT ANTECUBITAL Performed at [facility], [place] [address], [place], [place] [address]    Special Requests   Final    BOTTLES DRAWN AEROBIC AND ANAEROBIC Blood Culture adequate volume Performed at [facility] [address] [place], [place] [address]    Culture   Final    NO GROWTH 4 DAYS Performed at [facility], [place] [address], [place], [place] [address]    Report Status PENDING  Incomplete  Culture, blood (routine x 2)      Status: None (Preliminary result)   Collection Time: 07/20/18  2:20 PM  Result Value Ref Range Status   Specimen Description   Final    BLOOD RIGHT ANTECUBITAL Performed at [facility] [address], [place], [place] [address]    Special Requests   Final    BOTTLES DRAWN AEROBIC AND ANAEROBIC Blood Culture adequate volume Performed at [facility] [address] [place], [place] [address]    Culture   Final    NO GROWTH 4 DAYS Performed at [facility], [place] [address] [place], [place] [address]    Report Status PENDING  Incomplete  MRSA PCR Screening     Status: None   Collection Time: 07/21/18  3:12 AM  Result Value Ref Range Status   MRSA  by PCR NEGATIVE NEGATIVE Final    Comment:        The GeneXpert MRSA Assay (FDA approved for NASAL specimens only), is one component of a comprehensive MRSA colonization surveillance program. It is not intended to diagnose MRSA infection nor to guide or monitor treatment for MRSA infections. Performed at [facility], [place] [address], [place], [place] [address]      Time coordinating discharge: 40 minutes  SIGNED:   Charlynne Cousins, MD  [facility]

## 2018-07-25 NOTE — Progress Notes (Signed)
07/25/2018  Patient suffers from acute respiratory distress/COVID 19 which impairs their ability to perform daily activities like mobilize in the home without O2 sats droping.  A walker alone will not resolve the issues with performing activities of daily living. A wheelchair will allow patient to safely perform daily activities.  The patient can self propel in the home or has a caregiver who can provide assistance.     Thanks,  Barbarann Ehlers. Isaac Dubie, PT, DPT  Acute Rehabilitation [phone] pager [phone]) [phone] office

## 2018-07-25 NOTE — Care Management (Signed)
Case manager will contact patient with follow up appointment information.

## 2018-07-26 LAB — FIBRINOGEN: Fibrinogen: 610 mg/dL — ABNORMAL HIGH (ref 210–475)

## 2018-07-26 LAB — C-REACTIVE PROTEIN: CRP: 0.9 mg/dL (ref <1.0)

## 2018-07-26 LAB — GLUCOSE, CAPILLARY
Glucose-Capillary: 137 mg/dL — ABNORMAL HIGH (ref 70–99)
Glucose-Capillary: 137 mg/dL — ABNORMAL HIGH (ref 70–99)

## 2018-07-26 LAB — D-DIMER, QUANTITATIVE: D-Dimer, Quant: 1.62 ug/mL-FEU — ABNORMAL HIGH (ref 0.00–0.50)

## 2018-07-26 LAB — FERRITIN: Ferritin: 152 ng/mL (ref 11–307)

## 2018-07-26 LAB — MAGNESIUM: Magnesium: 1.4 mg/dL — ABNORMAL LOW (ref 1.7–2.4)

## 2018-07-26 MED ORDER — MAGNESIUM SULFATE 2 GM/50ML IV SOLN
2.0000 g | Freq: Once | INTRAVENOUS | Status: AC
  Administered 2018-07-26: 2 g via INTRAVENOUS
  Filled 2018-07-26: qty 50

## 2018-07-26 NOTE — Care Management (Addendum)
CM spoke to patient via phone. Patient stated she's doing well and will transition home today. Patient confirmed all equipment has been delivered to her home in anticipation for her discharge today. Patients son Delfino Lovett will provide transportation; [facility] is aware to provide a portable oxygen tank from DME closet for transportation home.   Midge Minium RN, BSN, NCM-BC, ACM-RN [phone]

## 2018-07-26 NOTE — Progress Notes (Signed)
Patient Demographics  Kara Hanson, is a 64 y.o. female  LDJ:[id number]  [id number]:[id number]  DOB - 1955-04-21  Admit date - 07/20/2018  Admitting Physician Debbe Odea, MD  Outpatient Primary MD for the patient is [name], MD  LOS - 6   Chief Complaint  Patient presents with  . Shortness of Breath        Assessment & Plan    Patient seen briefly today due for discharge soon per Discharge done yesterday by Dr Olevia Bowens, no further issues, Vital signs stable, patient feels fine.  Hypomagnesemia replaced today.  CBG stable.  Patient symptom-free.  Home oxygen and rolling wheelchair ordered per PT recommendation.  Requested to follow with PCP in a week.  Written discharge instructions provided.    Medications  Scheduled Meds: . allopurinol  300 mg Oral Daily  . aspirin EC  81 mg Oral Daily  . carvedilol  6.25 mg Oral BID WC  . enoxaparin (LOVENOX) injection  40 mg Subcutaneous Daily  . furosemide  40 mg Intravenous Q8H  . insulin aspart  0-5 Units Subcutaneous QHS  . insulin aspart  0-9 Units Subcutaneous TID WC  . insulin glargine  12 Units Subcutaneous BID  . pantoprazole  40 mg Oral Daily  . sodium chloride flush  3 mL Intravenous Q12H   Continuous Infusions: . sodium chloride    . magnesium sulfate 1 - 4 g bolus IVPB     PRN Meds:.sodium chloride, acetaminophen **OR** acetaminophen, ondansetron **OR** ondansetron (ZOFRAN) IV, sodium chloride flush    Discharge Instructions     - Based on the CDC's non-test criteria for ending self-isolation: You may not return to work/leave the home until at least 7 days since symptom onset AND 3 days without a fever (without taking tylenol, ibuprofen, etc.) AND have improvement in respiratory symptoms.  Directions  for you at home:  Wear a facemask You should wear a facemask that covers your nose and mouth when you are in the same room with other people and when you visit a healthcare provider. People who live with or visit you should also wear a facemask while they are in the same room with you.  Separate yourself from other people in your home As much as possible, you should stay in a different room from other people in your home. Also, you should use a separate bathroom, if available.  Avoid sharing household items You should not share dishes, drinking glasses, cups, eating utensils, towels, bedding, or other items with other people in your home. After using these items, you should wash them thoroughly with soap and water.  Cover your coughs and sneezes Cover your mouth and nose with a tissue when you cough or sneeze, or you can cough or sneeze into your sleeve. Throw used tissues in a lined trash can, and immediately wash your hands with soap and water for at least 20 seconds or use an alcohol-based hand rub.  Wash your Proofreader your hands  often and thoroughly with soap and water for at least 20 seconds. You can use an alcohol-based hand sanitizer if soap and water are not available and if your hands are not visibly dirty. Avoid touching your eyes, nose, and mouth with unwashed hands.  Directions for those who live with, or provide care at home for you:  Limit the number of people who have contact with the patient  If possible, have only one caregiver for the patient.  Other household members should stay in another home or place of residence. If this is not possible, they should stay  in another room, or be separated from the patient as much as possible. Use a separate bathroom, if available.  Restrict visitors who do not have an essential need to be in the home.  Ensure good ventilation Make sure that shared spaces in the home have good air flow, such as from an air conditioner or  an opened window, weather permitting.  Wash your hands often  Wash your hands often and thoroughly with soap and water for at least 20 seconds. You can use an alcohol based hand sanitizer if soap and water are not available and if your hands are not visibly dirty.  Avoid touching your eyes, nose, and mouth with unwashed hands.  Use disposable paper towels to dry your hands. If not available, use dedicated cloth towels and replace them when they become wet.  Wear a facemask and gloves  Wear a disposable facemask at all times in the room and gloves when you touch or have contact with the patient's blood, body fluids, and/or secretions or excretions, such as sweat, saliva, sputum, nasal mucus, vomit, urine, or feces.  Ensure the mask fits over your nose and mouth tightly, and do not touch it during use.  Throw out disposable facemasks and gloves after using them. Do not reuse.  Wash your hands immediately after removing your facemask and gloves.  If your personal clothing becomes contaminated, carefully remove clothing and launder. Wash your hands after handling contaminated clothing.  Place all used disposable facemasks, gloves, and other waste in a lined container before disposing them with other household waste.  Remove gloves and wash your hands immediately after handling these items.  Do not share dishes, glasses, or other household items with the patient  Avoid sharing household items. You should not share dishes, drinking glasses, cups, eating utensils, towels, bedding, or other items with a patient who is confirmed to have, or being evaluated for, COVID-19 infection.  After the person uses these items, you should wash them thoroughly with soap and water.  Wash laundry thoroughly  Immediately remove and wash clothes or bedding that have blood, body fluids, and/or secretions or excretions, such as sweat, saliva, sputum, nasal mucus, vomit, urine, or feces, on them.  Wear gloves  when handling laundry from the patient.  Read and follow directions on labels of laundry or clothing items and detergent. In general, wash and dry with the warmest temperatures recommended on the label.  Clean all areas the individual has used often  Clean all touchable surfaces, such as counters, tabletops, doorknobs, bathroom fixtures, toilets, phones, keyboards, tablets, and bedside tables, every day. Also, clean any surfaces that may have blood, body fluids, and/or secretions or excretions on them.  Wear gloves when cleaning surfaces the patient has come in contact with.  Use a diluted bleach solution (e.g., dilute bleach with 1 part bleach and 10 parts water) or a household disinfectant with a label  that says EPA-registered for coronaviruses. To make a bleach solution at home, add 1 tablespoon of bleach to 1 quart (4 cups) of water. For a larger supply, add  cup of bleach to 1 gallon (16 cups) of water.  Read labels of cleaning products and follow recommendations provided on product labels. Labels contain instructions for safe and effective use of the cleaning product including precautions you should take when applying the product, such as wearing gloves or eye protection and making sure you have good ventilation during use of the product.  Remove gloves and wash hands immediately after cleaning.  Monitor yourself for signs and symptoms of illness Caregivers and household members are considered close contacts, should monitor their health, and will be asked to limit movement outside of the home to the extent possible. Follow the monitoring steps for close contacts listed on the symptom monitoring form.       Time Spent in minutes   10 minutes   Lala Lund M.D on 07/26/2018 at 9:17 AM  Between 7am to 7pm - Pager - [phone]  After 7pm go to [email or web address] - password [email or web address]  And look for the night coverage person covering for me after hours  [facility] Office   [phone]    Subjective:   Kara Hanson today has, No headache, No chest pain, No abdominal pain - No Nausea, No new weakness tingling or numbness, No Cough - SOB.   Objective:   Vitals:   07/26/18 0427 07/26/18 0743 07/26/18 0800 07/26/18 0903  BP:  (!) 146/77 (!) 153/74   Pulse: 72 82 86 83  Resp: 18 16 16 17   Temp: 98.2 F (36.8 C) 98.1 F (36.7 C)    TempSrc: Oral Oral    SpO2: 99% 100% 100% 95%  Weight:      Height:        Wt Readings from Last 3 Encounters:  07/20/18 68 kg  03/29/18 78.8 kg  03/24/18 77.3 kg     Intake/Output Summary (Last 24 hours) at 07/26/2018 0917 Last data filed at 07/26/2018 0745 Gross per 24 hour  Intake 340 ml  Output 1700 ml  Net -1360 ml    Exam  Telemetry interview done, physical exam deferred to [place] Review

## 2018-07-27 ENCOUNTER — Telehealth: Payer: Self-pay

## 2018-07-27 DIAGNOSIS — I5033 Acute on chronic diastolic (congestive) heart failure: Secondary | ICD-10-CM | POA: Diagnosis not present

## 2018-07-27 DIAGNOSIS — Z9181 History of falling: Secondary | ICD-10-CM | POA: Diagnosis not present

## 2018-07-27 DIAGNOSIS — I130 Hypertensive heart and chronic kidney disease with heart failure and stage 1 through stage 4 chronic kidney disease, or unspecified chronic kidney disease: Secondary | ICD-10-CM | POA: Diagnosis not present

## 2018-07-27 DIAGNOSIS — E559 Vitamin D deficiency, unspecified: Secondary | ICD-10-CM | POA: Diagnosis not present

## 2018-07-27 DIAGNOSIS — E7800 Pure hypercholesterolemia, unspecified: Secondary | ICD-10-CM | POA: Diagnosis not present

## 2018-07-27 DIAGNOSIS — E1122 Type 2 diabetes mellitus with diabetic chronic kidney disease: Secondary | ICD-10-CM | POA: Diagnosis not present

## 2018-07-27 DIAGNOSIS — M5126 Other intervertebral disc displacement, lumbar region: Secondary | ICD-10-CM | POA: Diagnosis not present

## 2018-07-27 DIAGNOSIS — E785 Hyperlipidemia, unspecified: Secondary | ICD-10-CM | POA: Diagnosis not present

## 2018-07-27 DIAGNOSIS — N179 Acute kidney failure, unspecified: Secondary | ICD-10-CM | POA: Diagnosis not present

## 2018-07-27 DIAGNOSIS — Z9049 Acquired absence of other specified parts of digestive tract: Secondary | ICD-10-CM | POA: Diagnosis not present

## 2018-07-27 DIAGNOSIS — I252 Old myocardial infarction: Secondary | ICD-10-CM | POA: Diagnosis not present

## 2018-07-27 DIAGNOSIS — D631 Anemia in chronic kidney disease: Secondary | ICD-10-CM | POA: Diagnosis not present

## 2018-07-27 DIAGNOSIS — Z9981 Dependence on supplemental oxygen: Secondary | ICD-10-CM | POA: Diagnosis not present

## 2018-07-27 DIAGNOSIS — E1165 Type 2 diabetes mellitus with hyperglycemia: Secondary | ICD-10-CM | POA: Diagnosis not present

## 2018-07-27 DIAGNOSIS — N183 Chronic kidney disease, stage 3 (moderate): Secondary | ICD-10-CM | POA: Diagnosis not present

## 2018-07-27 DIAGNOSIS — M1030 Gout due to renal impairment, unspecified site: Secondary | ICD-10-CM | POA: Diagnosis not present

## 2018-07-27 DIAGNOSIS — M4800 Spinal stenosis, site unspecified: Secondary | ICD-10-CM | POA: Diagnosis not present

## 2018-07-27 DIAGNOSIS — J9621 Acute and chronic respiratory failure with hypoxia: Secondary | ICD-10-CM | POA: Diagnosis not present

## 2018-07-27 DIAGNOSIS — I5181 Takotsubo syndrome: Secondary | ICD-10-CM | POA: Diagnosis not present

## 2018-07-27 DIAGNOSIS — J1289 Other viral pneumonia: Secondary | ICD-10-CM | POA: Diagnosis not present

## 2018-07-27 DIAGNOSIS — Z7982 Long term (current) use of aspirin: Secondary | ICD-10-CM | POA: Diagnosis not present

## 2018-07-27 DIAGNOSIS — Z794 Long term (current) use of insulin: Secondary | ICD-10-CM | POA: Diagnosis not present

## 2018-07-27 NOTE — Telephone Encounter (Signed)
Dr Margarita Rana  -   From the discharge call:  She has a televisit scheduled with you on 07/31/2018.  She explained that she is now " feeling great."  She has O2 tanks and would like a portable concentrator.  Currently she is using the O2 @ 2L at night and only as needed during the day. I told her that you would be notified and can discuss with her at the appointment scheduled for next week.

## 2018-07-27 NOTE — Telephone Encounter (Signed)
Transition Care Management Follow-up Telephone Call  Date of discharge and from where: 07/25/2018,  [facility]  How have you been since you were released from the hospital? She was very upbeat and said that she is " feeling great."  She spoke about her hospitalization in [place], rehab stay, return to [facility] and hospitalization at [facility]. She explained that she was very scared going to a 'COVID" hospital; but the staff was wonderful and very supportive.   Any questions or concerns? None at this time. She said that she has not been coughing. Her appetite is good and her sense of smell and taste have returned. No nausea reported  She said that she understands the importance of wearing her mask, handwashing, self quarantine.  She is also aware of the importance of social distancing.   Items Reviewed:  Did the pt receive and understand the discharge instructions provided? Yes she has the instructions. no questions at this time  Medications obtained and verified? She said that she has all medications and did not need to review the medication list as there is nothing new except the ondansetron. She stated that she is taking all medications as ordered however, she does not have a glucometer and is in need of a new one.  Message sent to Feliz Beam, [place] requesting new glucometer. The patient requested that the order be sent to [place]. It will be delivered to her daughter's house and her son will pick it up there. She also needs insulin pen needles and said that her daughter, [name], is contacting the pharmacy about that order.   Any new allergies since your discharge? None reported  Do you have support at home? Yes, she is living with her son and daughter in law and their children. She is on the first floor of the home, no stairs to navigate indoors. 3 steps to enter the home. She said that she was sitting on the porch with her mask at the time of the call but otherwise  remains self quarantined.   Other (ie: DME, Home Health, etc)   [facility] to provide home PT/OT. Patient stated that a therapist is coming this afternoon  Home O2 through [place]. She is requesting a portable O2 concentrator. Informed her that Dr Margarita Rana would be notified of her request.  She said that she uses it at 2L at night but only uses as needed during the day.   She has a wheelchair and walker.  She explained that the wheelchair is really too big for the house,so she uses the walker when needed.   Functional Questionnaire: (I = Independent and D = Dependent) ADL's:independent - family assists when needed   Follow up appointments reviewed:    PCP Hospital f/u appt confirmed? televisit - 07/31/2018 @ 1050 with Dr [facility] f/u appt confirmed? None scheduled at this time   Are transportation arrangements needed? No  If their condition worsens, is the pt aware to call  their PCP or go to the ED? yes  Was the patient provided with contact information for the PCP's office or ED? Patient has the phone number for the clinic  Was the pt encouraged to call back with questions or concerns? yes

## 2018-07-30 NOTE — Telephone Encounter (Signed)
Noted  

## 2018-07-31 ENCOUNTER — Other Ambulatory Visit: Payer: Self-pay

## 2018-07-31 ENCOUNTER — Encounter: Payer: Self-pay | Admitting: Family Medicine

## 2018-07-31 ENCOUNTER — Ambulatory Visit: Payer: Medicare Other | Attending: Family Medicine | Admitting: Family Medicine

## 2018-07-31 DIAGNOSIS — E118 Type 2 diabetes mellitus with unspecified complications: Secondary | ICD-10-CM | POA: Diagnosis not present

## 2018-07-31 DIAGNOSIS — I130 Hypertensive heart and chronic kidney disease with heart failure and stage 1 through stage 4 chronic kidney disease, or unspecified chronic kidney disease: Secondary | ICD-10-CM | POA: Diagnosis not present

## 2018-07-31 DIAGNOSIS — M51369 Other intervertebral disc degeneration, lumbar region without mention of lumbar back pain or lower extremity pain: Secondary | ICD-10-CM

## 2018-07-31 DIAGNOSIS — E1165 Type 2 diabetes mellitus with hyperglycemia: Secondary | ICD-10-CM

## 2018-07-31 DIAGNOSIS — M5126 Other intervertebral disc displacement, lumbar region: Secondary | ICD-10-CM | POA: Diagnosis not present

## 2018-07-31 DIAGNOSIS — E1122 Type 2 diabetes mellitus with diabetic chronic kidney disease: Secondary | ICD-10-CM | POA: Diagnosis not present

## 2018-07-31 DIAGNOSIS — IMO0002 Reserved for concepts with insufficient information to code with codable children: Secondary | ICD-10-CM

## 2018-07-31 DIAGNOSIS — M542 Cervicalgia: Secondary | ICD-10-CM

## 2018-07-31 DIAGNOSIS — J1289 Other viral pneumonia: Secondary | ICD-10-CM | POA: Diagnosis not present

## 2018-07-31 DIAGNOSIS — I5033 Acute on chronic diastolic (congestive) heart failure: Secondary | ICD-10-CM | POA: Diagnosis not present

## 2018-07-31 DIAGNOSIS — J9621 Acute and chronic respiratory failure with hypoxia: Secondary | ICD-10-CM | POA: Diagnosis not present

## 2018-07-31 DIAGNOSIS — M5136 Other intervertebral disc degeneration, lumbar region: Secondary | ICD-10-CM

## 2018-07-31 DIAGNOSIS — I5032 Chronic diastolic (congestive) heart failure: Secondary | ICD-10-CM | POA: Diagnosis not present

## 2018-07-31 MED ORDER — ACCU-CHEK GUIDE W/DEVICE KIT: 1.0000 | PACK | Freq: Three times a day (TID) | 0 refills | Status: DC

## 2018-07-31 MED ORDER — TIZANIDINE HCL 4 MG PO TABS: 4.0000 mg | ORAL_TABLET | Freq: Three times a day (TID) | ORAL | 1 refills | Status: DC | PRN

## 2018-07-31 MED ORDER — GLUCOSE BLOOD VI STRP: ORAL_STRIP | 12 refills | Status: DC

## 2018-07-31 MED ORDER — BASAGLAR KWIKPEN 100 UNIT/ML ~~LOC~~ SOPN: 40.0000 [IU] | PEN_INJECTOR | Freq: Every day | SUBCUTANEOUS | 6 refills | Status: DC

## 2018-07-31 MED ORDER — ACCU-CHEK FASTCLIX LANCETS MISC: 12 refills | Status: DC

## 2018-07-31 NOTE — Progress Notes (Signed)
Virtual Visit via Telephone Note  I connected with Kara Hanson, on 07/31/2018 at 10:49 AM by telephone and verified that I am speaking with the correct person using two identifiers.   Consent: I discussed the limitations, risks, security and privacy concerns of performing an evaluation and management service by telephone and the availability of in person appointments. I also discussed with the patient that there may be a patient responsible charge related to this service. The patient expressed understanding and agreed to proceed.   Location of Patient: Home  Location of Provider: Clinic   Persons participating in Telemedicine visit: [name] Dr. Felecia Shelling   History of Present Illness: is a 63 year old female with a history of type 2 diabetes mellitus (A1c 9.6), hypertension, congestive heart failure (EF 50-55% from echo of 03/2018) who presents today for a follow-up visit after hospitalization at [facility] for COVID-19 related pneumonia from 07/20/2018 through 07/26/2018. She had returned to [place] from [place] after hospitalization in [place] for acute respiratory failure which involved ICU stay and was subsequently discharged to [facility] health care and rehab center from where she was discharged on 07/14/2026.  She had been tested for COVID-19 on 07/11/2018 and received a call here in [place] that she had tested positive after which she had been in self quarantine while living with her son and daughter-in-law.  (Outside records reviewed including SARS-CoV-2 test Doing a telemedicine encounter on 07/20/2018 she had been referred to the ED due to worsening respiratory symptoms. On presentation to the ED she was hypoxic and febrile, placed on high flow oxygen, found to also be in fluid overload.  Chest x-ray revealed areas of apparent pneumonia in both lower lung zones as well as left upper lobe, cardiomegaly.  She was treated with IV  antibiotics, Plaquenil, Tocilizumab she also received IV diuretics with diuresis achieved.  Her condition improved but she continued to require oxygen and so was discharged on 2 L of oxygen. Today she reports feeling well and is dyspneic only on moderate exertion but denies presence of cough, fever.  She has been undergoing physical therapy at home with improvement in her symptoms.  She complains of posterior neck pain and difficulty moving her legs because she feels her legs tremble. Her fasting sugar this morning was 202.    Past Medical History:  Diagnosis Date  . CHF (congestive heart failure) ([place])   . CVA (cerebral vascular accident) ([place])   . Diabetes mellitus without complication ([place])   . Hypercholesteremia   . Hypertension   . Myocardial infarction ([place])   . Spinal stenosis    Allergies  Allergen Reactions  . Garlic Shortness Of Breath, Itching and Swelling    Hand itching and swelling  . Latex Itching  . Morphine And Related Itching and Other (See Comments)    Headache   . Other Itching    Reaction to newspaper ink -itching and headache    Current Outpatient Medications on File Prior to Visit  Medication Sig Dispense Refill  . allopurinol (ZYLOPRIM) 300 MG tablet TAKE 1 TABLET (300 MG TOTAL) BY MOUTH DAILY. 90 tablet 1  . aspirin EC 81 MG tablet Take 81 mg by mouth daily.    . carvedilol (COREG) 12.5 MG tablet Take 1 tablet (12.5 mg total) by mouth 2 (two) times daily with a meal. 180 tablet 1  . furosemide (LASIX) 40 MG tablet Take 1 tablet (40 mg total) by mouth 2 (two) times daily. [place]  tablet 1  . insulin aspart (NOVOLOG) 100 UNIT/ML injection 0 to 12 units subcutaneously 3 times daily before meals as per sliding scale 30 mL 6  . Insulin Glargine (BASAGLAR KWIKPEN) 100 UNIT/ML SOPN Inject 0.35 mLs (35 Units total) into the skin at bedtime. 30 mL 6  . Insulin Pen Needle 31G X 5 MM MISC 1 each by Does not apply route at bedtime. 30 each 5  . Lancet Device MISC Use as  instructed 3 times daily 1 each 0  . losartan (COZAAR) 25 MG tablet Take 25 mg by mouth daily.    [place] Kitchen omeprazole (PRILOSEC) 20 MG capsule Take 20 mg by mouth daily.    . ondansetron (ZOFRAN) 4 MG tablet Take 1 tablet (4 mg total) by mouth every 6 (six) hours as needed for nausea. 20 tablet 0  . SUPER B COMPLEX/C CAPS Take 1 capsule by mouth daily.    [place] Kitchen tiZANidine (ZANAFLEX) 4 MG tablet Take 1 tablet (4 mg total) by mouth every 8 (eight) hours as needed for muscle spasms. 90 tablet 2  . amLODipine (NORVASC) 5 MG tablet Take 1 tablet (5 mg total) by mouth at bedtime. 90 tablet 1  . atorvastatin (LIPITOR) 80 MG tablet Take 1 tablet (80 mg total) by mouth daily at 6 PM. 30 tablet 0  . dicyclomine (BENTYL) 20 MG tablet Take 1 tablet (20 mg total) by mouth 2 (two) times daily. (Patient not taking: Reported on 03/20/2018) 20 tablet 0  . ergocalciferol (DRISDOL) 1.25 MG (50000 UT) capsule Take 1 capsule (50,000 Units total) by mouth once a week. (Patient not taking: Reported on 07/31/2018) 9 capsule 1  . ezetimibe (ZETIA) 10 MG tablet Take 1 tablet (10 mg total) by mouth daily. 30 tablet 0  . gabapentin (NEURONTIN) 300 MG capsule Take 1 capsule (300 mg total) by mouth 2 (two) times daily. 180 capsule 1  . lansoprazole (PREVACID) 15 MG capsule Take 1 capsule (15 mg total) by mouth daily. (Patient not taking: Reported on 07/20/2018) 30 capsule 6  . metoCLOPramide (REGLAN) 5 MG tablet Take 1 tablet (5 mg total) by mouth 3 (three) times daily before meals. (Patient not taking: Reported on 03/20/2018) 90 tablet 1   No current facility-administered medications on file prior to visit.     Observations/Objective: Awake, alert, oriented x3 Not in acute distress  CMP Latest Ref Rng & Units 07/25/2018 07/24/2018 07/23/2018  Glucose 70 - 99 mg/dL 90 130(H) 104(H)  BUN 8 - 23 mg/dL 35(H) 36(H) 45(H)  Creatinine 0.44 - 1.00 mg/dL 1.05(H) 1.10(H) 1.43(H)  Sodium 135 - 145 mmol/L 138 136 137  Potassium 3.5 - 5.1 mmol/L  3.8 4.0 4.2  Chloride 98 - 111 mmol/L 101 104 106  CO2 22 - 32 mmol/L 27 27 23   Calcium 8.9 - 10.3 mg/dL 9.3 8.7(L) 8.8(L)  Total Protein 6.5 - 8.1 g/dL 6.4(L) 5.8(L) 5.7(L)  Total Bilirubin 0.3 - 1.2 mg/dL 0.2(L) 0.1(L) 0.2(L)  Alkaline Phos 38 - 126 U/L 66 62 63  AST 15 - 41 U/L 14(L) 15 16  ALT 0 - 44 U/L 11 14 17     Lipid Panel     Component Value Date/Time   CHOL 295 (H) 03/24/2018 0551   TRIG 137 07/20/2018 1803   HDL 45 03/24/2018 0551   CHOLHDL 6.6 03/24/2018 0551   VLDL 35 03/24/2018 0551   LDLCALC 215 (H) 03/24/2018 0551      Assessment and Plan: 1. COVID-19 virus infection Improved significantly with resolution  of pneumonia she is back to baseline Continue home PT for deconditioning Okay to discontinue quarantine but will need to maintain general precautions including social distancing I will follow-up with her in 1 week.  2. Diabetes mellitus type 2 with complications, uncontrolled (HCC) Uncontrolled Increase dose of Lantus to 40 units Counseled on Diabetic diet, my plate method, 549 minutes of moderate intensity exercise/week Keep blood sugar logs with fasting goals of 80-120 mg/dl, random of less than 180 and in the event of sugars less than 60 mg/dl or greater than 400 mg/dl please notify the clinic ASAP. It is recommended that you undergo annual eye exams and annual foot exams. Pneumonia vaccine is recommended. - Insulin Glargine (BASAGLAR KWIKPEN) 100 UNIT/ML SOPN; Inject 0.4 mLs (40 Units total) into the skin at bedtime.  Dispense: 30 mL; Refill: 6  3. Chronic diastolic CHF (congestive heart failure) (HCC) EF 50 to 55% from 03/2018 Euvolemic Continue current medications  4 Neck pain Likely muscle spasm Refill tizanidine  Follow Up Instructions: Return in about 1 week (around 08/07/2018).     I discussed the assessment and treatment plan with the patient. The patient was provided an opportunity to ask questions and all were answered. The patient  agreed with the plan and demonstrated an understanding of the instructions.   The patient was advised to call back or seek an in-person evaluation if the symptoms worsen or if the condition fails to improve as anticipated.     I provided 26 minutes total of non-face-to-face time during this encounter including median intraservice time, reviewing previous notes, labs, imaging, medications and explaining diagnosis and management.     [name], MD, FAAFP. [facility] [place], [place]   07/31/2018, 10:49 AM

## 2018-07-31 NOTE — Progress Notes (Signed)
Patient has been called and DOB has been verified. Patient has been screened and transferred to PCP to start phone visit.  C/C: HFU

## 2018-08-01 ENCOUNTER — Telehealth: Payer: Self-pay | Admitting: Family Medicine

## 2018-08-01 NOTE — Telephone Encounter (Signed)
1 week for 3 weeks 0 times a week for 1 week 1 week for 1 week  adl transferees, iadl, and exercise

## 2018-08-01 NOTE — Telephone Encounter (Signed)
Kara Hanson was called and given verbal orders.

## 2018-08-02 ENCOUNTER — Telehealth: Payer: Self-pay | Admitting: Family Medicine

## 2018-08-02 DIAGNOSIS — J9621 Acute and chronic respiratory failure with hypoxia: Secondary | ICD-10-CM | POA: Diagnosis not present

## 2018-08-02 DIAGNOSIS — J1289 Other viral pneumonia: Secondary | ICD-10-CM | POA: Diagnosis not present

## 2018-08-02 DIAGNOSIS — I5033 Acute on chronic diastolic (congestive) heart failure: Secondary | ICD-10-CM | POA: Diagnosis not present

## 2018-08-02 DIAGNOSIS — E1122 Type 2 diabetes mellitus with diabetic chronic kidney disease: Secondary | ICD-10-CM | POA: Diagnosis not present

## 2018-08-02 DIAGNOSIS — I130 Hypertensive heart and chronic kidney disease with heart failure and stage 1 through stage 4 chronic kidney disease, or unspecified chronic kidney disease: Secondary | ICD-10-CM | POA: Diagnosis not present

## 2018-08-02 NOTE — Telephone Encounter (Signed)
Arnold from [facility] Wants to clarify the amount sliding scale for pt insulins please follow up

## 2018-08-06 NOTE — Telephone Encounter (Signed)
Kara Hanson was called and informed of sliding scale for patient.

## 2018-08-07 ENCOUNTER — Encounter: Payer: Self-pay | Admitting: Family Medicine

## 2018-08-07 ENCOUNTER — Other Ambulatory Visit: Payer: Self-pay

## 2018-08-07 ENCOUNTER — Ambulatory Visit: Payer: Medicare Other | Attending: Family Medicine | Admitting: Family Medicine

## 2018-08-07 DIAGNOSIS — E1122 Type 2 diabetes mellitus with diabetic chronic kidney disease: Secondary | ICD-10-CM | POA: Diagnosis not present

## 2018-08-07 DIAGNOSIS — I5032 Chronic diastolic (congestive) heart failure: Secondary | ICD-10-CM

## 2018-08-07 DIAGNOSIS — M542 Cervicalgia: Secondary | ICD-10-CM

## 2018-08-07 DIAGNOSIS — I130 Hypertensive heart and chronic kidney disease with heart failure and stage 1 through stage 4 chronic kidney disease, or unspecified chronic kidney disease: Secondary | ICD-10-CM | POA: Diagnosis not present

## 2018-08-07 DIAGNOSIS — J1289 Other viral pneumonia: Secondary | ICD-10-CM | POA: Diagnosis not present

## 2018-08-07 DIAGNOSIS — M5136 Other intervertebral disc degeneration, lumbar region: Secondary | ICD-10-CM

## 2018-08-07 DIAGNOSIS — U071 COVID-19: Secondary | ICD-10-CM | POA: Diagnosis not present

## 2018-08-07 DIAGNOSIS — I5033 Acute on chronic diastolic (congestive) heart failure: Secondary | ICD-10-CM | POA: Diagnosis not present

## 2018-08-07 DIAGNOSIS — J9621 Acute and chronic respiratory failure with hypoxia: Secondary | ICD-10-CM | POA: Diagnosis not present

## 2018-08-07 DIAGNOSIS — M5126 Other intervertebral disc displacement, lumbar region: Secondary | ICD-10-CM | POA: Diagnosis not present

## 2018-08-07 MED ORDER — DULOXETINE HCL 60 MG PO CPEP: 60.0000 mg | ORAL_CAPSULE | Freq: Every day | ORAL | 3 refills | Status: DC

## 2018-08-07 NOTE — Progress Notes (Signed)
Virtual Visit via Telephone Note  I connected with Kara Hanson, on 08/07/2018 at 10:03 AM by telephone and verified that I am speaking with the correct person using two identifiers.   Consent: I discussed the limitations, risks, security and privacy concerns of performing an evaluation and management service by telephone and the availability of in person appointments. I also discussed with the patient that there may be a patient responsible charge related to this service. The patient expressed understanding and agreed to proceed.   Location of Patient: Home  Location of Provider: Clinic   Persons participating in Telemedicine visit: Leara Rawl Farrington-CMA Dr. Felecia Shelling    History of Present Illness: Kara Hanson is a 63 year old female with a history of type 2 diabetes mellitus (A1c 11.8 from 03/2018), hypertension, congestive heart failure (EF 50-55% from echo of 03/2018),who presents today for a follow-up visit after hospitalization at [facility] for COVID-19 related pneumonia from 07/20/2018 through 07/26/2018 (Sars-CoV-2 positive -  diagnosed in [place] on 07/11/18).  She reports doing well and denies dyspnea, chest pain, dysgeusia. Currently undergoing home PT. Today she complains of low back pain which causes her to flex forward, described as a 5/10, her legs also feel wobbly. She also has neck pain. I had prescribed Tizanidine at her last visit which had been somewhat beneficial but makes her sleepy. She also uses Gabapentin and Tylenol. Denies numbness in extremities or falls.  She has received back injections in the past. Lumbar spine MRI from 06/2016 revealed: IMPRESSION: 1. Small right subarticular and extraforaminal disc protrusions at L5-S1, potentially irritating the transiting S1 and L5 nerve roots respectively. 2. Right foraminal/extraforaminal disc protrusion at L4-5, potentially irritating the transiting right L4 nerve root. 3.  Additional small left subarticular disc protrusion at L4-5 with resultant mild left lateral recess stenosis. 4. Degenerative disc bulge at L3-4 without stenosis or neural impingement. 5. Bilateral facet arthrosis at L4-5 and L5-S1. Minimal enhancement about the L4-5 facets likely degenerative in nature.   With regards to her Diabetes her sugars have ranged between 157-200; she denies hypoglycemia.   Past Medical History:  Diagnosis Date  . CHF (congestive heart failure) ([place])   . CVA (cerebral vascular accident) ([place])   . Diabetes mellitus without complication ([place])   . Hypercholesteremia   . Hypertension   . Myocardial infarction ([place])   . Spinal stenosis    Allergies  Allergen Reactions  . Garlic Shortness Of Breath, Itching and Swelling    Hand itching and swelling  . Latex Itching  . Morphine And Related Itching and Other (See Comments)    Headache   . Other Itching    Reaction to newspaper ink -itching and headache    Current Outpatient Medications on File Prior to Visit  Medication Sig Dispense Refill  . Accu-Chek FastClix Lancets MISC USE AS DIRECTED TO TEST BLOOD SUGAR THREE TIMES DAILY 102 each 12  . allopurinol (ZYLOPRIM) 300 MG tablet TAKE 1 TABLET (300 MG TOTAL) BY MOUTH DAILY. 90 tablet 1  . aspirin EC 81 MG tablet Take 81 mg by mouth daily.    . Blood Glucose Monitoring Suppl (ACCU-CHEK GUIDE) w/Device KIT 1 each by Does not apply route 3 (three) times daily. 1 kit 0  . carvedilol (COREG) 12.5 MG tablet Take 1 tablet (12.5 mg total) by mouth 2 (two) times daily with a meal. 180 tablet 1  . ergocalciferol (DRISDOL) 1.25 MG (50000 UT) capsule Take 1 capsule (50,000 Units total) by mouth once  a week. 9 capsule 1  . furosemide (LASIX) 40 MG tablet Take 1 tablet (40 mg total) by mouth 2 (two) times daily. 180 tablet 1  . glucose blood (ACCU-CHEK GUIDE) test strip USE AS DIRECTED TO TEST BLOOD SUGAR THREE TIMES DAILY 100 each 12  . insulin aspart (NOVOLOG) 100  UNIT/ML injection 0 to 12 units subcutaneously 3 times daily before meals as per sliding scale 30 mL 6  . Insulin Glargine (BASAGLAR KWIKPEN) 100 UNIT/ML SOPN Inject 0.4 mLs (40 Units total) into the skin at bedtime. 30 mL 6  . Insulin Pen Needle 31G X 5 MM MISC 1 each by Does not apply route at bedtime. 30 each 5  . Lancet Device MISC Use as instructed 3 times daily 1 each 0  . losartan (COZAAR) 25 MG tablet Take 25 mg by mouth daily.    Marland Kitchen omeprazole (PRILOSEC) 20 MG capsule Take 20 mg by mouth daily.    . ondansetron (ZOFRAN) 4 MG tablet Take 1 tablet (4 mg total) by mouth every 6 (six) hours as needed for nausea. 20 tablet 0  . SUPER B COMPLEX/C CAPS Take 1 capsule by mouth daily.    Marland Kitchen tiZANidine (ZANAFLEX) 4 MG tablet Take 1 tablet (4 mg total) by mouth every 8 (eight) hours as needed for muscle spasms. 90 tablet 1  . amLODipine (NORVASC) 5 MG tablet Take 1 tablet (5 mg total) by mouth at bedtime. 90 tablet 1  . atorvastatin (LIPITOR) 80 MG tablet Take 1 tablet (80 mg total) by mouth daily at 6 PM. 30 tablet 0  . dicyclomine (BENTYL) 20 MG tablet Take 1 tablet (20 mg total) by mouth 2 (two) times daily. (Patient not taking: Reported on 03/20/2018) 20 tablet 0  . ezetimibe (ZETIA) 10 MG tablet Take 1 tablet (10 mg total) by mouth daily. 30 tablet 0  . gabapentin (NEURONTIN) 300 MG capsule Take 1 capsule (300 mg total) by mouth 2 (two) times daily. 180 capsule 1  . lansoprazole (PREVACID) 15 MG capsule Take 1 capsule (15 mg total) by mouth daily. (Patient not taking: Reported on 07/20/2018) 30 capsule 6  . metoCLOPramide (REGLAN) 5 MG tablet Take 1 tablet (5 mg total) by mouth 3 (three) times daily before meals. (Patient not taking: Reported on 03/20/2018) 90 tablet 1   No current facility-administered medications on file prior to visit.     Observations/Objective: Awake, alert, oriented x3 Not in acute distress  CMP Latest Ref Rng & Units 07/25/2018 07/24/2018 07/23/2018  Glucose 70 - 99  mg/dL 90 130(H) 104(H)  BUN 8 - 23 mg/dL 35(H) 36(H) 45(H)  Creatinine 0.44 - 1.00 mg/dL 1.05(H) 1.10(H) 1.43(H)  Sodium 135 - 145 mmol/L 138 136 137  Potassium 3.5 - 5.1 mmol/L 3.8 4.0 4.2  Chloride 98 - 111 mmol/L 101 104 106  CO2 22 - 32 mmol/L 27 27 23   Calcium 8.9 - 10.3 mg/dL 9.3 8.7(L) 8.8(L)  Total Protein 6.5 - 8.1 g/dL 6.4(L) 5.8(L) 5.7(L)  Total Bilirubin 0.3 - 1.2 mg/dL 0.2(L) 0.1(L) 0.2(L)  Alkaline Phos 38 - 126 U/L 66 62 63  AST 15 - 41 U/L 14(L) 15 16  ALT 0 - 44 U/L 11 14 17     Lipid Panel     Component Value Date/Time   CHOL 295 (H) 03/24/2018 0551   TRIG 137 07/20/2018 1803   HDL 45 03/24/2018 0551   CHOLHDL 6.6 03/24/2018 0551   VLDL 35 03/24/2018 0551   LDLCALC 215 (H) 03/24/2018  0551    Lab Results  Component Value Date   HGBA1C 11.8 (A) 03/29/2018    Assessment and Plan: 1. Bulging lumbar disc Uncontrolled Cymbalta added to regimen Continue tizanidine and gabapentin, PT Consider referral to back specialist if symptoms persist - DULoxetine (CYMBALTA) 60 MG capsule; Take 1 capsule (60 mg total) by mouth daily. For chronic back and neck pain  Dispense: 30 capsule; Refill: 3  2. Chronic diastolic CHF (congestive heart failure) (HCC) EF 50 to 55% Euvolemic  3. COVID-19 virus infection Resolved  4. Neck pain See #1 above - DULoxetine (CYMBALTA) 60 MG capsule; Take 1 capsule (60 mg total) by mouth daily. For chronic back and neck pain  Dispense: 30 capsule; Refill: 3   Follow Up Instructions: Return in about 1 month (around 09/07/2018) for follow up of back pain.    I discussed the assessment and treatment plan with the patient. The patient was provided an opportunity to ask questions and all were answered. The patient agreed with the plan and demonstrated an understanding of the instructions.   The patient was advised to call back or seek an in-person evaluation if the symptoms worsen or if the condition fails to improve as  anticipated.     I provided 15 minutes total of non-face-to-face time during this encounter including median intraservice time, reviewing previous notes, labs, imaging, medications and explaining diagnosis and management.     [name], MD, FAAFP. [facility] and Carmi Archer, [place]   08/07/2018, 10:03 AM

## 2018-08-07 NOTE — Progress Notes (Signed)
Patient has been called and DOB has been verified. Patient has been screened and transferred to PCP to start phone visit.   Covid-19 patient. Patient is having back pain.

## 2018-08-09 DIAGNOSIS — I5033 Acute on chronic diastolic (congestive) heart failure: Secondary | ICD-10-CM | POA: Diagnosis not present

## 2018-08-09 DIAGNOSIS — E1122 Type 2 diabetes mellitus with diabetic chronic kidney disease: Secondary | ICD-10-CM | POA: Diagnosis not present

## 2018-08-09 DIAGNOSIS — J1289 Other viral pneumonia: Secondary | ICD-10-CM | POA: Diagnosis not present

## 2018-08-09 DIAGNOSIS — U071 COVID-19: Secondary | ICD-10-CM | POA: Diagnosis not present

## 2018-08-09 DIAGNOSIS — I130 Hypertensive heart and chronic kidney disease with heart failure and stage 1 through stage 4 chronic kidney disease, or unspecified chronic kidney disease: Secondary | ICD-10-CM | POA: Diagnosis not present

## 2018-08-09 DIAGNOSIS — J9621 Acute and chronic respiratory failure with hypoxia: Secondary | ICD-10-CM | POA: Diagnosis not present

## 2018-08-16 DIAGNOSIS — I5033 Acute on chronic diastolic (congestive) heart failure: Secondary | ICD-10-CM | POA: Diagnosis not present

## 2018-08-16 DIAGNOSIS — U071 COVID-19: Secondary | ICD-10-CM | POA: Diagnosis not present

## 2018-08-16 DIAGNOSIS — E1122 Type 2 diabetes mellitus with diabetic chronic kidney disease: Secondary | ICD-10-CM | POA: Diagnosis not present

## 2018-08-16 DIAGNOSIS — I130 Hypertensive heart and chronic kidney disease with heart failure and stage 1 through stage 4 chronic kidney disease, or unspecified chronic kidney disease: Secondary | ICD-10-CM | POA: Diagnosis not present

## 2018-08-16 DIAGNOSIS — J9621 Acute and chronic respiratory failure with hypoxia: Secondary | ICD-10-CM | POA: Diagnosis not present

## 2018-08-16 DIAGNOSIS — J1289 Other viral pneumonia: Secondary | ICD-10-CM | POA: Diagnosis not present

## 2018-08-17 ENCOUNTER — Telehealth: Payer: Self-pay | Admitting: Family Medicine

## 2018-08-17 NOTE — Telephone Encounter (Signed)
Kara Hanson called because the patient is requesting to be discharged from home health. Due to patient stating she has been feeling better due to traveling with her daughter in law. Please follow up.

## 2018-08-17 NOTE — Telephone Encounter (Signed)
Kara Hanson with [facility] called to notify patient has requested to be discharged from OT. Kara Hanson states they will fax form over.

## 2018-08-20 NOTE — Telephone Encounter (Signed)
Will route to PCP for review. 

## 2018-08-20 NOTE — Telephone Encounter (Signed)
We will need to respect her desires.

## 2018-08-20 NOTE — Telephone Encounter (Signed)
Noted  

## 2018-08-22 DIAGNOSIS — J9621 Acute and chronic respiratory failure with hypoxia: Secondary | ICD-10-CM | POA: Diagnosis not present

## 2018-08-22 DIAGNOSIS — E1122 Type 2 diabetes mellitus with diabetic chronic kidney disease: Secondary | ICD-10-CM | POA: Diagnosis not present

## 2018-08-22 DIAGNOSIS — I130 Hypertensive heart and chronic kidney disease with heart failure and stage 1 through stage 4 chronic kidney disease, or unspecified chronic kidney disease: Secondary | ICD-10-CM | POA: Diagnosis not present

## 2018-08-22 DIAGNOSIS — J1289 Other viral pneumonia: Secondary | ICD-10-CM | POA: Diagnosis not present

## 2018-08-22 DIAGNOSIS — U071 COVID-19: Secondary | ICD-10-CM | POA: Diagnosis not present

## 2018-08-22 DIAGNOSIS — I5033 Acute on chronic diastolic (congestive) heart failure: Secondary | ICD-10-CM | POA: Diagnosis not present

## 2018-08-31 ENCOUNTER — Telehealth: Payer: Self-pay | Admitting: Family Medicine

## 2018-08-31 NOTE — Telephone Encounter (Signed)
New Message   Pt's daughter calling states she would like to speak to a nurse about pt's Oxygen did not want to go into detail with me. Please f/u

## 2018-08-31 NOTE — Telephone Encounter (Signed)
Kara Hanson- pt.'s daughter called to request a smaller and more portable oxygen concentrator.  She also states that no one taught them how to use the oxygen.  Given phone number to Huey Romans - [phone].  Will route to PCP incase script is needed for portable oxygen.

## 2018-09-01 ENCOUNTER — Other Ambulatory Visit: Payer: Self-pay | Admitting: Family Medicine

## 2018-09-01 DIAGNOSIS — M4802 Spinal stenosis, cervical region: Secondary | ICD-10-CM

## 2018-09-01 DIAGNOSIS — M5136 Other intervertebral disc degeneration, lumbar region: Secondary | ICD-10-CM

## 2018-09-01 DIAGNOSIS — M5126 Other intervertebral disc displacement, lumbar region: Secondary | ICD-10-CM

## 2018-09-03 MED ORDER — MISC. DEVICES MISC: 0 refills | Status: DC

## 2018-09-03 NOTE — Telephone Encounter (Signed)
Prescription has been written for portable oxygen concentrator

## 2018-09-04 ENCOUNTER — Telehealth: Payer: Self-pay | Admitting: Family Medicine

## 2018-09-04 NOTE — Telephone Encounter (Signed)
Caryl Pina with [employer] called stating they have questions in regards to an order they received and will also be faxing over some documents. Please follow up.

## 2018-09-04 NOTE — Telephone Encounter (Signed)
Kara Hanson has spoken with the company and paperwork will be filled out once received.

## 2018-09-05 NOTE — Telephone Encounter (Signed)
Completed CMN for POC faxed to [facility] - fax # [phone]

## 2018-09-10 ENCOUNTER — Other Ambulatory Visit: Payer: Self-pay

## 2018-09-10 ENCOUNTER — Ambulatory Visit: Payer: Medicare Other | Attending: Family Medicine | Admitting: Family Medicine

## 2018-09-10 ENCOUNTER — Telehealth: Payer: Self-pay

## 2018-09-10 ENCOUNTER — Encounter: Payer: Self-pay | Admitting: Family Medicine

## 2018-09-10 DIAGNOSIS — E1165 Type 2 diabetes mellitus with hyperglycemia: Secondary | ICD-10-CM

## 2018-09-10 DIAGNOSIS — I5032 Chronic diastolic (congestive) heart failure: Secondary | ICD-10-CM | POA: Diagnosis not present

## 2018-09-10 DIAGNOSIS — E118 Type 2 diabetes mellitus with unspecified complications: Secondary | ICD-10-CM

## 2018-09-10 DIAGNOSIS — IMO0002 Reserved for concepts with insufficient information to code with codable children: Secondary | ICD-10-CM

## 2018-09-10 NOTE — Telephone Encounter (Signed)
Call received from Ashly/[employer] who explained that a POC, home concentrator and back up tank were delivered to the patient on 09/06/2018.  He stated that he would have a staff member call the patient and her daughter to clarify what equipment is in the home and instruct them to contact [facility] to pick up the equipment that was in the home.   Attempted to contact the patient to inform her that [employer] will be contacting her to about the large tank dlelivery.  Message left requesting a call back to this CM.  Call to patient's daughter, [name], informed her of above conversation with [employer]. She said that she thinks [employer] delivered the extra tank and she will call her mother to clarify. Informed her that a representative from [facility] should be contacting her.

## 2018-09-10 NOTE — Progress Notes (Signed)
Patient has been called and DOB has been verified. Patient has been screened and transferred to PCP to start phone visit.     

## 2018-09-10 NOTE — Telephone Encounter (Signed)
Call placed to [facility], spoke to [facility] # [phone] , explained that the patient stated a large O2 tank was delivered.  She is expecting a POC. Informed him that a signed CMN for a POC was faxed to [place] on 09/05/2018. Ashly said that he would check on the order

## 2018-09-10 NOTE — Telephone Encounter (Signed)
Thanks for the update

## 2018-09-10 NOTE — Progress Notes (Signed)
Virtual Visit via Telephone Note  I connected with Kara Hanson, on 09/10/2018 at 10:40 AM by telephone due to the COVID-19 pandemic and verified that I am speaking with the correct person using two identifiers.   Consent: I discussed the limitations, risks, security and privacy concerns of performing an evaluation and management service by telephone and the availability of in person appointments. I also discussed with the patient that there may be a patient responsible charge related to this service. The patient expressed understanding and agreed to proceed.   Location of Patient: Home  Location of Provider: Clinic   Persons participating in Telemedicine visit: Eliany Mccarter Farrington-CMA Dr. Felecia Shelling     History of Present Illness: Kara Hanson is a 63 year old female with a history of type 2 diabetes mellitus (A1c 11.8 from 03/2018), hypertension, congestive heart failure (EF 50-55% from echo of 03/2018), hospitalization at [facility] for COVID-19 related pneumonia from 07/20/2018 through 07/26/2018 (Sars-CoV-2 positive -  diagnosed in [place] on 07/11/18).  She complains of worsening shortness of breath over the last 1 week and previously did not need her oxygen but now needs 3 L of oxygen.  She denies pedal edema, weight gain and states her weight is 159 pounds.  Her last weight was 150 pounds 07/20/2018. She endorses compliance with her Lasix, low-sodium diet and diabetic diet. Denies cough, wheezing, chest pain, fever, diarrhea, body aches. She complains of her oxygen tanks being too heavy and had requested portable oxygen but states the DME company be delivered another huge tank which she is unable to move around. At the time she made the appointment she had pain in her neck and her back which she states have resolved at this time.   Her highest fasting sugar was 201 and she endorses compliance with her insulin. Past Medical History:  Diagnosis Date   . CHF (congestive heart failure) ([place])   . CVA (cerebral vascular accident) ([place])   . Diabetes mellitus without complication ([place])   . Hypercholesteremia   . Hypertension   . Myocardial infarction ([place])   . Spinal stenosis    Allergies  Allergen Reactions  . Garlic Shortness Of Breath, Itching and Swelling    Hand itching and swelling  . Latex Itching  . Morphine And Related Itching and Other (See Comments)    Headache   . Other Itching    Reaction to newspaper ink -itching and headache    Current Outpatient Medications on File Prior to Visit  Medication Sig Dispense Refill  . Accu-Chek FastClix Lancets MISC USE AS DIRECTED TO TEST BLOOD SUGAR THREE TIMES DAILY 102 each 12  . allopurinol (ZYLOPRIM) 300 MG tablet TAKE 1 TABLET (300 MG TOTAL) BY MOUTH DAILY. 90 tablet 1  . amLODipine (NORVASC) 5 MG tablet TAKE 1 TABLET (5 MG TOTAL) BY MOUTH AT BEDTIME. 90 tablet 0  . aspirin EC 81 MG tablet Take 81 mg by mouth daily.    . Blood Glucose Monitoring Suppl (ACCU-CHEK GUIDE) w/Device KIT 1 each by Does not apply route 3 (three) times daily. 1 kit 0  . carvedilol (COREG) 12.5 MG tablet Take 1 tablet (12.5 mg total) by mouth 2 (two) times daily with a meal. 180 tablet 1  . DULoxetine (CYMBALTA) 60 MG capsule Take 1 capsule (60 mg total) by mouth daily. For chronic back and neck pain 30 capsule 3  . ergocalciferol (DRISDOL) 1.25 MG (50000 UT) capsule Take 1 capsule (50,000 Units total) by mouth once a week.  9 capsule 1  . furosemide (LASIX) 40 MG tablet Take 1 tablet (40 mg total) by mouth 2 (two) times daily. 180 tablet 1  . gabapentin (NEURONTIN) 300 MG capsule TAKE 1 CAPSULE (300 MG TOTAL) BY MOUTH 2 (TWO) TIMES DAILY. 180 capsule 1  . glucose blood (ACCU-CHEK GUIDE) test strip USE AS DIRECTED TO TEST BLOOD SUGAR THREE TIMES DAILY 100 each 12  . insulin aspart (NOVOLOG) 100 UNIT/ML injection 0 to 12 units subcutaneously 3 times daily before meals as per sliding scale 30 mL 6  . Insulin  Glargine (BASAGLAR KWIKPEN) 100 UNIT/ML SOPN Inject 0.4 mLs (40 Units total) into the skin at bedtime. 30 mL 6  . Insulin Pen Needle 31G X 5 MM MISC 1 each by Does not apply route at bedtime. 30 each 5  . Lancet Device MISC Use as instructed 3 times daily 1 each 0  . losartan (COZAAR) 25 MG tablet Take 25 mg by mouth daily.    . Misc. Devices MISC Portable oxygen concentrator.  Diagnosis-chronic respiratory failure. 1 each 0  . omeprazole (PRILOSEC) 20 MG capsule Take 20 mg by mouth daily.    . ondansetron (ZOFRAN) 4 MG tablet Take 1 tablet (4 mg total) by mouth every 6 (six) hours as needed for nausea. 20 tablet 0  . SUPER B COMPLEX/C CAPS Take 1 capsule by mouth daily.    Marland Kitchen tiZANidine (ZANAFLEX) 4 MG tablet TAKE 1 TABLET (4 MG TOTAL) BY MOUTH EVERY 8 (EIGHT) HOURS AS NEEDED FOR MUSCLE SPASMS. 90 tablet 1  . atorvastatin (LIPITOR) 80 MG tablet Take 1 tablet (80 mg total) by mouth daily at 6 PM. 30 tablet 0  . dicyclomine (BENTYL) 20 MG tablet Take 1 tablet (20 mg total) by mouth 2 (two) times daily. (Patient not taking: Reported on 03/20/2018) 20 tablet 0  . ezetimibe (ZETIA) 10 MG tablet Take 1 tablet (10 mg total) by mouth daily. 30 tablet 0  . lansoprazole (PREVACID) 15 MG capsule Take 1 capsule (15 mg total) by mouth daily. (Patient not taking: Reported on 07/20/2018) 30 capsule 6  . metoCLOPramide (REGLAN) 5 MG tablet Take 1 tablet (5 mg total) by mouth 3 (three) times daily before meals. (Patient not taking: Reported on 03/20/2018) 90 tablet 1   No current facility-administered medications on file prior to visit.     Observations/Objective: Awake, alert, oriented x3 Not in acute distress  Lab Results  Component Value Date   HGBA1C 11.8 (A) 03/29/2018    Assessment and Plan: 1. Chronic diastolic CHF (congestive heart failure) (HCC) EF of 50 to 55% She does have dyspnea but has absence of other symptoms of fluid overload Advised to increase Lasix from 40 mg twice daily to 60 mg in  the morning and 40 mg in the evening We will evaluate for an in person visit next week We will coordinate with case manager regarding obtaining simply go mini oxygen concentrator for her.  2.  Uncontrolled Type 2 diabetes mellitus Last A1c was 11.8 Fasting sugars remain elevated We will order A1c at incoming office visit next week and adjust regimen accordingly Counseled on Diabetic diet, my plate method, 163 minutes of moderate intensity exercise/week Keep blood sugar logs with fasting goals of 80-120 mg/dl, random of less than 180 and in the event of sugars less than 60 mg/dl or greater than 400 mg/dl please notify the clinic ASAP. It is recommended that you undergo annual eye exams and annual foot exams. Pneumonia vaccine is recommended.  Follow Up Instructions: 1 week   I discussed the assessment and treatment plan with the patient. The patient was provided an opportunity to ask questions and all were answered. The patient agreed with the plan and demonstrated an understanding of the instructions.   The patient was advised to call back or seek an in-person evaluation if the symptoms worsen or if the condition fails to improve as anticipated.     I provided 13 minutes total of non-face-to-face time during this encounter including median intraservice time, reviewing previous notes, labs, imaging, medications, management and patient verbalized understanding.     [name], MD, FAAFP. [facility] and [place], [place]   09/10/2018, 10:40 AM

## 2018-09-10 NOTE — Telephone Encounter (Signed)
Call received from patient. She said that she has 2 large tanks, one is from [place].  She said that she did have a small concentrator delivered. Informed her that [employer] is supposed to be contacting her to answer her questions/concerns about her O2 and to instruct her to call [employer] to pick up their equipment.  Instructed her to call this CM back tomorrow if she has not heard from [place] and she said she understood.

## 2018-09-12 ENCOUNTER — Telehealth: Payer: Self-pay

## 2018-09-12 NOTE — Telephone Encounter (Signed)
Call placed to [facility] to inquire if they contacted the patient about her O2 set up at home and the large tank that was delivered. He said that the respiratory therapist went out to patient's home to assess the situation and provide education.   Attempted to contact the patient to confirm that her questions were answered about her O2. Message left requesting a call back to this CM # [phone]

## 2018-09-17 ENCOUNTER — Other Ambulatory Visit: Payer: Self-pay

## 2018-09-17 ENCOUNTER — Ambulatory Visit: Payer: Medicare Other | Attending: Family Medicine | Admitting: Family Medicine

## 2018-09-17 ENCOUNTER — Encounter: Payer: Self-pay | Admitting: Family Medicine

## 2018-09-17 VITALS — BP 148/77 | HR 80 | Temp 97.7°F | Ht 59.0 in | Wt 157.8 lb

## 2018-09-17 DIAGNOSIS — Z8673 Personal history of transient ischemic attack (TIA), and cerebral infarction without residual deficits: Secondary | ICD-10-CM | POA: Diagnosis not present

## 2018-09-17 DIAGNOSIS — I10 Essential (primary) hypertension: Secondary | ICD-10-CM | POA: Diagnosis not present

## 2018-09-17 DIAGNOSIS — Z79899 Other long term (current) drug therapy: Secondary | ICD-10-CM | POA: Diagnosis not present

## 2018-09-17 DIAGNOSIS — I252 Old myocardial infarction: Secondary | ICD-10-CM | POA: Diagnosis not present

## 2018-09-17 DIAGNOSIS — Z793 Long term (current) use of hormonal contraceptives: Secondary | ICD-10-CM | POA: Insufficient documentation

## 2018-09-17 DIAGNOSIS — Z885 Allergy status to narcotic agent status: Secondary | ICD-10-CM | POA: Diagnosis not present

## 2018-09-17 DIAGNOSIS — Z1211 Encounter for screening for malignant neoplasm of colon: Secondary | ICD-10-CM | POA: Diagnosis not present

## 2018-09-17 DIAGNOSIS — I5022 Chronic systolic (congestive) heart failure: Secondary | ICD-10-CM | POA: Diagnosis not present

## 2018-09-17 DIAGNOSIS — Z9981 Dependence on supplemental oxygen: Secondary | ICD-10-CM | POA: Insufficient documentation

## 2018-09-17 DIAGNOSIS — Z9049 Acquired absence of other specified parts of digestive tract: Secondary | ICD-10-CM | POA: Insufficient documentation

## 2018-09-17 DIAGNOSIS — J9621 Acute and chronic respiratory failure with hypoxia: Secondary | ICD-10-CM

## 2018-09-17 DIAGNOSIS — E7800 Pure hypercholesterolemia, unspecified: Secondary | ICD-10-CM | POA: Insufficient documentation

## 2018-09-17 DIAGNOSIS — Z91040 Latex allergy status: Secondary | ICD-10-CM | POA: Diagnosis not present

## 2018-09-17 DIAGNOSIS — Z794 Long term (current) use of insulin: Secondary | ICD-10-CM

## 2018-09-17 DIAGNOSIS — I110 Hypertensive heart disease with heart failure: Secondary | ICD-10-CM | POA: Insufficient documentation

## 2018-09-17 DIAGNOSIS — Z91048 Other nonmedicinal substance allergy status: Secondary | ICD-10-CM | POA: Diagnosis not present

## 2018-09-17 DIAGNOSIS — Z833 Family history of diabetes mellitus: Secondary | ICD-10-CM | POA: Insufficient documentation

## 2018-09-17 DIAGNOSIS — Z91018 Allergy to other foods: Secondary | ICD-10-CM | POA: Insufficient documentation

## 2018-09-17 DIAGNOSIS — R5383 Other fatigue: Secondary | ICD-10-CM | POA: Insufficient documentation

## 2018-09-17 DIAGNOSIS — Z823 Family history of stroke: Secondary | ICD-10-CM | POA: Insufficient documentation

## 2018-09-17 DIAGNOSIS — E559 Vitamin D deficiency, unspecified: Secondary | ICD-10-CM | POA: Diagnosis not present

## 2018-09-17 DIAGNOSIS — M1A00X0 Idiopathic chronic gout, unspecified site, without tophus (tophi): Secondary | ICD-10-CM | POA: Insufficient documentation

## 2018-09-17 DIAGNOSIS — E1140 Type 2 diabetes mellitus with diabetic neuropathy, unspecified: Secondary | ICD-10-CM | POA: Diagnosis not present

## 2018-09-17 LAB — GLUCOSE, POCT (MANUAL RESULT ENTRY): POC Glucose: 189 mg/dl — AB (ref 70–99)

## 2018-09-17 LAB — POCT GLYCOSYLATED HEMOGLOBIN (HGB A1C): HbA1c, POC (controlled diabetic range): 7.2 % — AB (ref 0.0–7.0)

## 2018-09-17 MED ORDER — CARVEDILOL 12.5 MG PO TABS: 12.5000 mg | ORAL_TABLET | Freq: Two times a day (BID) | ORAL | 1 refills | Status: DC

## 2018-09-17 MED ORDER — ATORVASTATIN CALCIUM 80 MG PO TABS: 80.0000 mg | ORAL_TABLET | Freq: Every day | ORAL | 1 refills | Status: DC

## 2018-09-17 MED ORDER — ALLOPURINOL 300 MG PO TABS: 300.0000 mg | ORAL_TABLET | Freq: Every day | ORAL | 1 refills | Status: DC

## 2018-09-17 MED ORDER — FUROSEMIDE 40 MG PO TABS: 60.0000 mg | ORAL_TABLET | Freq: Two times a day (BID) | ORAL | 1 refills | Status: DC

## 2018-09-17 NOTE — Progress Notes (Signed)
Subjective:  Patient ID: Kara Hanson, female    DOB: 1955-09-20  Age: 64 y.o. MRN: [id number]  CC: Congestive Heart Failure and Diabetes   HPI Kara Hanson is a 63 year old female with a history of type 2 diabetes mellitus (A1c 7.2), hypertension, congestive heart failure (EF 50-55% from echo of 03/2018), hospitalization at [facility] for COVID-19 related pneumonia from 07/20/2018 through 07/26/2018 (Sars-CoV-2 positive -  diagnosed in [place] on 07/11/18).  At her telehealth visit last week she had complained of increased dyspnea, weight gain and needing 3 L of oxygen all the time were previously she did not need oxygen at rest.  I had increased her Lasix to 60 mg in the morning and 40 mg in the evening however she has been taking 40 mg 3 times daily.  Her weight today is 157 pounds which is 7 pounds up from 150 pounds 2 months ago.  She denies excessive intake of sodium. She still feels short of breath and her oxygen saturation off oxygen was 83%. She has lost appetite but denies an abnormal taste in her mouth, denies chest pain, wheezing, rhinorrhea, fever, abdominal pain. Endorses fatigue. She does not have pedal edema. Her cardiologist is Dr Caryl Comes she states she has not seen in a while but will be calling to make an appointment with him.  Past Medical History:  Diagnosis Date  . CHF (congestive heart failure) ([place])   . CVA (cerebral vascular accident) ([place])   . Diabetes mellitus without complication ([place])   . Hypercholesteremia   . Hypertension   . Myocardial infarction ([place])   . Spinal stenosis     Past Surgical History:  Procedure Laterality Date  . BLADDER SURGERY    . CARDIAC CATHETERIZATION N/A 04/25/2016   Procedure: Left Heart Cath and Coronary Angiography;  Surgeon: Lorretta Harp, MD;  Location: [place] [facility];  Service: Cardiovascular;  Laterality: N/A;  . CESAREAN SECTION    . CHOLECYSTECTOMY      Family History  Problem Relation Age of  Onset  . Diabetes Mellitus II Father   . Stroke Father     Allergies  Allergen Reactions  . Garlic Shortness Of Breath, Itching and Swelling    Hand itching and swelling  . Latex Itching  . Morphine And Related Itching and Other (See Comments)    Headache   . Other Itching    Reaction to newspaper ink -itching and headache    Outpatient Medications Prior to Visit  Medication Sig Dispense Refill  . Accu-Chek FastClix Lancets MISC USE AS DIRECTED TO TEST BLOOD SUGAR THREE TIMES DAILY 102 each 12  . amLODipine (NORVASC) 5 MG tablet TAKE 1 TABLET (5 MG TOTAL) BY MOUTH AT BEDTIME. 90 tablet 0  . aspirin EC 81 MG tablet Take 81 mg by mouth daily.    . Blood Glucose Monitoring Suppl (ACCU-CHEK GUIDE) w/Device KIT 1 each by Does not apply route 3 (three) times daily. 1 kit 0  . DULoxetine (CYMBALTA) 60 MG capsule Take 1 capsule (60 mg total) by mouth daily. For chronic back and neck pain 30 capsule 3  . ergocalciferol (DRISDOL) 1.25 MG (50000 UT) capsule Take 1 capsule (50,000 Units total) by mouth once a week. 9 capsule 1  . gabapentin (NEURONTIN) 300 MG capsule TAKE 1 CAPSULE (300 MG TOTAL) BY MOUTH 2 (TWO) TIMES DAILY. 180 capsule 1  . glucose blood (ACCU-CHEK GUIDE) test strip USE AS DIRECTED TO TEST BLOOD SUGAR THREE TIMES DAILY 100  each 12  . insulin aspart (NOVOLOG) 100 UNIT/ML injection 0 to 12 units subcutaneously 3 times daily before meals as per sliding scale 30 mL 6  . Insulin Glargine (BASAGLAR KWIKPEN) 100 UNIT/ML SOPN Inject 0.4 mLs (40 Units total) into the skin at bedtime. 30 mL 6  . Insulin Pen Needle 31G X 5 MM MISC 1 each by Does not apply route at bedtime. 30 each 5  . Lancet Device MISC Use as instructed 3 times daily 1 each 0  . losartan (COZAAR) 25 MG tablet Take 25 mg by mouth daily.    . Misc. Devices MISC Portable oxygen concentrator.  Diagnosis-chronic respiratory failure. 1 each 0  . omeprazole (PRILOSEC) 20 MG capsule Take 20 mg by mouth daily.    . ondansetron  (ZOFRAN) 4 MG tablet Take 1 tablet (4 mg total) by mouth every 6 (six) hours as needed for nausea. 20 tablet 0  . SUPER B COMPLEX/C CAPS Take 1 capsule by mouth daily.    . tiZANidine (ZANAFLEX) 4 MG tablet TAKE 1 TABLET (4 MG TOTAL) BY MOUTH EVERY 8 (EIGHT) HOURS AS NEEDED FOR MUSCLE SPASMS. 90 tablet 1  . allopurinol (ZYLOPRIM) 300 MG tablet TAKE 1 TABLET (300 MG TOTAL) BY MOUTH DAILY. 90 tablet 1  . carvedilol (COREG) 12.5 MG tablet Take 1 tablet (12.5 mg total) by mouth 2 (two) times daily with a meal. 180 tablet 1  . furosemide (LASIX) 40 MG tablet Take 1 tablet (40 mg total) by mouth 2 (two) times daily. 180 tablet 1  . dicyclomine (BENTYL) 20 MG tablet Take 1 tablet (20 mg total) by mouth 2 (two) times daily. (Patient not taking: Reported on 03/20/2018) 20 tablet 0  . ezetimibe (ZETIA) 10 MG tablet Take 1 tablet (10 mg total) by mouth daily. 30 tablet 0  . lansoprazole (PREVACID) 15 MG capsule Take 1 capsule (15 mg total) by mouth daily. (Patient not taking: Reported on 07/20/2018) 30 capsule 6  . metoCLOPramide (REGLAN) 5 MG tablet Take 1 tablet (5 mg total) by mouth 3 (three) times daily before meals. (Patient not taking: Reported on 03/20/2018) 90 tablet 1  . atorvastatin (LIPITOR) 80 MG tablet Take 1 tablet (80 mg total) by mouth daily at 6 PM. 30 tablet 0   No facility-administered medications prior to visit.      ROS Review of Systems  Constitutional: Negative for activity change, appetite change and fatigue.  HENT: Negative for congestion, sinus pressure and sore throat.   Eyes: Negative for visual disturbance.  Respiratory: Positive for shortness of breath. Negative for cough, chest tightness and wheezing.   Cardiovascular: Negative for chest pain and palpitations.  Gastrointestinal: Negative for abdominal distention, abdominal pain and constipation.  Endocrine: Negative for polydipsia.  Genitourinary: Negative for dysuria and frequency.  Musculoskeletal: Negative for  arthralgias and back pain.  Skin: Negative for rash.  Neurological: Negative for tremors, light-headedness and numbness.  Hematological: Does not bruise/bleed easily.  Psychiatric/Behavioral: Negative for agitation and behavioral problems.    Objective:  BP (!) 148/77   Pulse 80   Temp 97.7 F (36.5 C) (Oral)   Ht 4' 11" (1.499 m)   Wt 157 lb 12.8 oz (71.6 kg)   SpO2 (!) 83%   BMI 31.87 kg/m   BP/Weight 09/17/2018 07/26/2018 07/20/2018  Systolic BP 148 153 -  Diastolic BP 77 74 -  Wt. (Lbs) 157.8 - 150  BMI 31.87 - 29.29      Physical Exam Constitutional:        Appearance: She is well-developed.  Neck:     Comments: No JVD Cardiovascular:     Rate and Rhythm: Normal rate.     Heart sounds: Normal heart sounds. No murmur.  Pulmonary:     Effort: Pulmonary effort is normal.     Breath sounds: Normal breath sounds. No wheezing or rales.  Chest:     Chest wall: No tenderness.  Abdominal:     General: Bowel sounds are normal. There is no distension.     Palpations: Abdomen is soft. There is no mass.     Tenderness: There is no abdominal tenderness.  Musculoskeletal: Normal range of motion.        General: No swelling.     Right lower leg: No edema.     Left lower leg: No edema.  Neurological:     Mental Status: She is alert and oriented to person, place, and time.     CMP Latest Ref Rng & Units 07/25/2018 07/24/2018 07/23/2018  Glucose 70 - 99 mg/dL 90 130(H) 104(H)  BUN 8 - 23 mg/dL 35(H) 36(H) 45(H)  Creatinine 0.44 - 1.00 mg/dL 1.05(H) 1.10(H) 1.43(H)  Sodium 135 - 145 mmol/L 138 136 137  Potassium 3.5 - 5.1 mmol/L 3.8 4.0 4.2  Chloride 98 - 111 mmol/L 101 104 106  CO2 22 - 32 mmol/L 27 27 23  Calcium 8.9 - 10.3 mg/dL 9.3 8.7(L) 8.8(L)  Total Protein 6.5 - 8.1 g/dL 6.4(L) 5.8(L) 5.7(L)  Total Bilirubin 0.3 - 1.2 mg/dL 0.2(L) 0.1(L) 0.2(L)  Alkaline Phos 38 - 126 U/L 66 62 63  AST 15 - 41 U/L 14(L) 15 16  ALT 0 - 44 U/L 11 14 17    Lipid Panel      Component Value Date/Time   CHOL 295 (H) 03/24/2018 0551   TRIG 137 07/20/2018 1803   HDL 45 03/24/2018 0551   CHOLHDL 6.6 03/24/2018 0551   VLDL 35 03/24/2018 0551   LDLCALC 215 (H) 03/24/2018 0551    CBC    Component Value Date/Time   WBC 4.8 07/25/2018 0500   RBC 3.48 (L) 07/25/2018 0500   HGB 9.6 (L) 07/25/2018 0500   HGB 8.0 (L) 07/20/2018 1803   HCT 31.2 (L) 07/25/2018 0500   PLT 397 07/25/2018 0500   MCV 89.7 07/25/2018 0500   MCH 27.6 07/25/2018 0500   MCHC 30.8 07/25/2018 0500   RDW 17.4 (H) 07/25/2018 0500   LYMPHSABS 0.9 07/25/2018 0500   MONOABS 0.4 07/25/2018 0500   EOSABS 0.2 07/25/2018 0500   BASOSABS 0.0 07/25/2018 0500    Lab Results  Component Value Date   HGBA1C 7.2 (A) 09/17/2018    Assessment & Plan:   1. Type 2 diabetes mellitus with diabetic neuropathy, with long-term current use of insulin (HCC) Significant improvement in A1c down to 7.2 from 11.8 previously No regimen change today Counseled on Diabetic diet, my plate method, 150 minutes of moderate intensity exercise/week Keep blood sugar logs with fasting goals of 80-120 mg/dl, random of less than 180 and in the event of sugars less than 60 mg/dl or greater than 400 mg/dl please notify the clinic ASAP. It is recommended that you undergo annual eye exams and annual foot exams. Pneumonia vaccine is recommended. - POCT glucose (manual entry) - POCT glycosylated hemoglobin (Hb A1C) - Ambulatory referral to Ophthalmology - atorvastatin (LIPITOR) 80 MG tablet; Take 1 tablet (80 mg total) by mouth daily at 6 PM for 30 days.  Dispense: 90 tablet; Refill: 1    2. Chronic systolic congestive heart failure (HCC) No evidence of fluid overload but she is dyspneic We will send of BNP Increase Lasix to 60 mg twice daily She is in the process of calling her cardiologist for an appointment - Brain natriuretic peptide - furosemide (LASIX) 40 MG tablet; Take 1.5 tablets (60 mg total) by mouth 2 (two) times  daily.  Dispense: 270 tablet; Refill: 1  3. Screening for colon cancer - Ambulatory referral to Gastroenterology  4. Acute and chronic respiratory failure with hypoxia (HCC) On chronic oxygen therapy - DG Chest 2 View; Future  5. Other fatigue - CBC with Differential/Platelet - Magnesium - VITAMIN D 25 Hydroxy (Vit-D Deficiency, Fractures)  6. Essential hypertension Slightly elevated No regimen change today but will work on lifestyle modification Counseled on blood pressure goal of less than 130/80, low-sodium, DASH diet, medication compliance, 150 minutes of moderate intensity exercise per week. Discussed medication compliance, adverse effects. - carvedilol (COREG) 12.5 MG tablet; Take 1 tablet (12.5 mg total) by mouth 2 (two) times daily with a meal.  Dispense: 180 tablet; Refill: 1  7. Idiopathic chronic gout without tophus, unspecified site Stable - allopurinol (ZYLOPRIM) 300 MG tablet; Take 1 tablet (300 mg total) by mouth daily.  Dispense: 90 tablet; Refill: 1   Healthcare maintenance- She is due for Pap smear, will schedule this for another visit when she is more stable. Meds ordered this encounter  Medications  . carvedilol (COREG) 12.5 MG tablet    Sig: Take 1 tablet (12.5 mg total) by mouth 2 (two) times daily with a meal.    Dispense:  180 tablet    Refill:  1  . allopurinol (ZYLOPRIM) 300 MG tablet    Sig: Take 1 tablet (300 mg total) by mouth daily.    Dispense:  90 tablet    Refill:  1  . furosemide (LASIX) 40 MG tablet    Sig: Take 1.5 tablets (60 mg total) by mouth 2 (two) times daily.    Dispense:  270 tablet    Refill:  1  . atorvastatin (LIPITOR) 80 MG tablet    Sig: Take 1 tablet (80 mg total) by mouth daily at 6 PM for 30 days.    Dispense:  90 tablet    Refill:  1    Follow-up: Return in about 1 month (around 10/17/2018) for medical conditions.       [name], MD, FAAFP. [facility] and [place], [place]   09/17/2018, 2:42 PM

## 2018-09-18 ENCOUNTER — Telehealth: Payer: Self-pay

## 2018-09-18 ENCOUNTER — Other Ambulatory Visit: Payer: Self-pay | Admitting: Family Medicine

## 2018-09-18 LAB — CBC WITH DIFFERENTIAL/PLATELET
Basophils Absolute: 0 10*3/uL (ref 0.0–0.2)
Basos: 0 %
EOS (ABSOLUTE): 0.1 10*3/uL (ref 0.0–0.4)
Eos: 2 %
Hematocrit: 32.5 % — ABNORMAL LOW (ref 34.0–46.6)
Hemoglobin: 11 g/dL — ABNORMAL LOW (ref 11.1–15.9)
Immature Grans (Abs): 0 10*3/uL (ref 0.0–0.1)
Immature Granulocytes: 0 %
Lymphocytes Absolute: 0.9 10*3/uL (ref 0.7–3.1)
Lymphs: 17 %
MCH: 28.7 pg (ref 26.6–33.0)
MCHC: 33.8 g/dL (ref 31.5–35.7)
MCV: 85 fL (ref 79–97)
Monocytes Absolute: 0.5 10*3/uL (ref 0.1–0.9)
Monocytes: 9 %
Neutrophils Absolute: 3.7 10*3/uL (ref 1.4–7.0)
Neutrophils: 72 %
Platelets: 313 10*3/uL (ref 150–450)
RBC: 3.83 x10E6/uL (ref 3.77–5.28)
RDW: 14.4 % (ref 11.7–15.4)
WBC: 5.2 10*3/uL (ref 3.4–10.8)

## 2018-09-18 LAB — MAGNESIUM: Magnesium: 1.8 mg/dL (ref 1.6–2.3)

## 2018-09-18 LAB — VITAMIN D 25 HYDROXY (VIT D DEFICIENCY, FRACTURES): Vit D, 25-Hydroxy: 19.9 ng/mL — ABNORMAL LOW (ref 30.0–100.0)

## 2018-09-18 LAB — BRAIN NATRIURETIC PEPTIDE: BNP: 151.3 pg/mL — ABNORMAL HIGH (ref 0.0–100.0)

## 2018-09-18 MED ORDER — ERGOCALCIFEROL 1.25 MG (50000 UT) PO CAPS: 50000.0000 [IU] | ORAL_CAPSULE | ORAL | 1 refills | Status: DC

## 2018-09-18 NOTE — Telephone Encounter (Signed)
Call received from patient stating that she is having trouble with her POC. Instructed her to call [facility] and provided her with the phone # [phone].  She was at her daughter's house at the time of the call.

## 2018-09-20 ENCOUNTER — Telehealth: Payer: Self-pay

## 2018-09-20 ENCOUNTER — Ambulatory Visit (HOSPITAL_COMMUNITY)
Admission: RE | Admit: 2018-09-20 | Discharge: 2018-09-20 | Disposition: A | Discharge status: Home / Self Care | Payer: Medicare Other | Source: Ambulatory Visit | Attending: Family Medicine | Admitting: Family Medicine

## 2018-09-20 ENCOUNTER — Other Ambulatory Visit: Payer: Self-pay

## 2018-09-20 DIAGNOSIS — J9621 Acute and chronic respiratory failure with hypoxia: Secondary | ICD-10-CM | POA: Insufficient documentation

## 2018-09-20 DIAGNOSIS — J90 Pleural effusion, not elsewhere classified: Secondary | ICD-10-CM | POA: Diagnosis not present

## 2018-09-20 NOTE — Telephone Encounter (Signed)
-----   Message from [name], MD sent at 09/18/2018  1:10 PM EDT ----- Magnesium is back to normal, anemia has improved significantly.  Vitamin D is still low which could explain her fatigue.  I have refilled her vitamin D capsules.

## 2018-09-20 NOTE — Telephone Encounter (Signed)
Patient name and DOB has been verified Patient was informed of lab results. Patient had no questions.  

## 2018-09-24 ENCOUNTER — Emergency Department (HOSPITAL_COMMUNITY): Payer: Medicare Other

## 2018-09-24 ENCOUNTER — Inpatient Hospital Stay (HOSPITAL_COMMUNITY)
Admission: EM | Admit: 2018-09-24 | Discharge: 2018-10-02 | DRG: 291 | Disposition: A | Discharge status: Home Health | Payer: Medicare Other | Attending: Internal Medicine | Admitting: Internal Medicine

## 2018-09-24 ENCOUNTER — Encounter (HOSPITAL_COMMUNITY): Payer: Self-pay

## 2018-09-24 ENCOUNTER — Other Ambulatory Visit: Payer: Self-pay

## 2018-09-24 ENCOUNTER — Telehealth: Payer: Self-pay | Admitting: Emergency Medicine

## 2018-09-24 DIAGNOSIS — E785 Hyperlipidemia, unspecified: Secondary | ICD-10-CM | POA: Diagnosis present

## 2018-09-24 DIAGNOSIS — I10 Essential (primary) hypertension: Secondary | ICD-10-CM | POA: Diagnosis present

## 2018-09-24 DIAGNOSIS — R251 Tremor, unspecified: Secondary | ICD-10-CM | POA: Diagnosis present

## 2018-09-24 DIAGNOSIS — K5900 Constipation, unspecified: Secondary | ICD-10-CM | POA: Diagnosis not present

## 2018-09-24 DIAGNOSIS — Z833 Family history of diabetes mellitus: Secondary | ICD-10-CM

## 2018-09-24 DIAGNOSIS — Z9981 Dependence on supplemental oxygen: Secondary | ICD-10-CM

## 2018-09-24 DIAGNOSIS — Z8673 Personal history of transient ischemic attack (TIA), and cerebral infarction without residual deficits: Secondary | ICD-10-CM

## 2018-09-24 DIAGNOSIS — N183 Chronic kidney disease, stage 3 unspecified: Secondary | ICD-10-CM | POA: Diagnosis present

## 2018-09-24 DIAGNOSIS — I110 Hypertensive heart disease with heart failure: Secondary | ICD-10-CM | POA: Diagnosis not present

## 2018-09-24 DIAGNOSIS — N179 Acute kidney failure, unspecified: Secondary | ICD-10-CM | POA: Diagnosis present

## 2018-09-24 DIAGNOSIS — Z7982 Long term (current) use of aspirin: Secondary | ICD-10-CM

## 2018-09-24 DIAGNOSIS — J1289 Other viral pneumonia: Secondary | ICD-10-CM | POA: Diagnosis present

## 2018-09-24 DIAGNOSIS — I509 Heart failure, unspecified: Secondary | ICD-10-CM | POA: Diagnosis not present

## 2018-09-24 DIAGNOSIS — U071 COVID-19: Secondary | ICD-10-CM | POA: Diagnosis present

## 2018-09-24 DIAGNOSIS — E1165 Type 2 diabetes mellitus with hyperglycemia: Secondary | ICD-10-CM | POA: Diagnosis present

## 2018-09-24 DIAGNOSIS — Z794 Long term (current) use of insulin: Secondary | ICD-10-CM

## 2018-09-24 DIAGNOSIS — J90 Pleural effusion, not elsewhere classified: Secondary | ICD-10-CM | POA: Diagnosis not present

## 2018-09-24 DIAGNOSIS — Z79899 Other long term (current) drug therapy: Secondary | ICD-10-CM | POA: Diagnosis not present

## 2018-09-24 DIAGNOSIS — T383X5A Adverse effect of insulin and oral hypoglycemic [antidiabetic] drugs, initial encounter: Secondary | ICD-10-CM | POA: Diagnosis not present

## 2018-09-24 DIAGNOSIS — D649 Anemia, unspecified: Secondary | ICD-10-CM | POA: Diagnosis present

## 2018-09-24 DIAGNOSIS — R0602 Shortness of breath: Secondary | ICD-10-CM | POA: Diagnosis not present

## 2018-09-24 DIAGNOSIS — E11649 Type 2 diabetes mellitus with hypoglycemia without coma: Secondary | ICD-10-CM | POA: Diagnosis not present

## 2018-09-24 DIAGNOSIS — I252 Old myocardial infarction: Secondary | ICD-10-CM | POA: Diagnosis not present

## 2018-09-24 DIAGNOSIS — J811 Chronic pulmonary edema: Secondary | ICD-10-CM | POA: Diagnosis not present

## 2018-09-24 DIAGNOSIS — I5033 Acute on chronic diastolic (congestive) heart failure: Secondary | ICD-10-CM | POA: Diagnosis present

## 2018-09-24 DIAGNOSIS — Z20828 Contact with and (suspected) exposure to other viral communicable diseases: Secondary | ICD-10-CM | POA: Diagnosis not present

## 2018-09-24 DIAGNOSIS — I130 Hypertensive heart and chronic kidney disease with heart failure and stage 1 through stage 4 chronic kidney disease, or unspecified chronic kidney disease: Principal | ICD-10-CM | POA: Diagnosis present

## 2018-09-24 DIAGNOSIS — I5022 Chronic systolic (congestive) heart failure: Secondary | ICD-10-CM

## 2018-09-24 DIAGNOSIS — J9621 Acute and chronic respiratory failure with hypoxia: Secondary | ICD-10-CM | POA: Diagnosis present

## 2018-09-24 DIAGNOSIS — I2510 Atherosclerotic heart disease of native coronary artery without angina pectoris: Secondary | ICD-10-CM | POA: Diagnosis present

## 2018-09-24 DIAGNOSIS — E1122 Type 2 diabetes mellitus with diabetic chronic kidney disease: Secondary | ICD-10-CM | POA: Diagnosis present

## 2018-09-24 DIAGNOSIS — I5043 Acute on chronic combined systolic (congestive) and diastolic (congestive) heart failure: Secondary | ICD-10-CM | POA: Diagnosis present

## 2018-09-24 DIAGNOSIS — E160 Drug-induced hypoglycemia without coma: Secondary | ICD-10-CM | POA: Diagnosis not present

## 2018-09-24 DIAGNOSIS — E7800 Pure hypercholesterolemia, unspecified: Secondary | ICD-10-CM | POA: Diagnosis present

## 2018-09-24 DIAGNOSIS — IMO0002 Reserved for concepts with insufficient information to code with codable children: Secondary | ICD-10-CM

## 2018-09-24 HISTORY — DX: Heart failure, unspecified: I50.9

## 2018-09-24 LAB — BASIC METABOLIC PANEL
Anion gap: 11 (ref 5–15)
BUN: 29 mg/dL — ABNORMAL HIGH (ref 8–23)
CO2: 26 mmol/L (ref 22–32)
Calcium: 9.4 mg/dL (ref 8.9–10.3)
Chloride: 105 mmol/L (ref 98–111)
Creatinine, Ser: 1.4 mg/dL — ABNORMAL HIGH (ref 0.44–1.00)
GFR calc Af Amer: 46 mL/min — ABNORMAL LOW (ref >60)
GFR calc non Af Amer: 40 mL/min — ABNORMAL LOW (ref >60)
Glucose, Bld: 222 mg/dL — ABNORMAL HIGH (ref 70–99)
Potassium: 3.5 mmol/L (ref 3.5–5.1)
Sodium: 142 mmol/L (ref 135–145)

## 2018-09-24 LAB — CBC WITH DIFFERENTIAL/PLATELET
Abs Immature Granulocytes: 0.02 10*3/uL (ref 0.00–0.07)
Basophils Absolute: 0 10*3/uL (ref 0.0–0.1)
Basophils Relative: 0 %
Eosinophils Absolute: 0.1 10*3/uL (ref 0.0–0.5)
Eosinophils Relative: 2 %
HCT: 35.5 % — ABNORMAL LOW (ref 36.0–46.0)
Hemoglobin: 10.7 g/dL — ABNORMAL LOW (ref 12.0–15.0)
Immature Granulocytes: 0 %
Lymphocytes Relative: 17 %
Lymphs Abs: 0.8 10*3/uL (ref 0.7–4.0)
MCH: 26.8 pg (ref 26.0–34.0)
MCHC: 30.1 g/dL (ref 30.0–36.0)
MCV: 89 fL (ref 80.0–100.0)
Monocytes Absolute: 0.4 10*3/uL (ref 0.1–1.0)
Monocytes Relative: 7 %
Neutro Abs: 3.7 10*3/uL (ref 1.7–7.7)
Neutrophils Relative %: 74 %
Platelets: 279 10*3/uL (ref 150–400)
RBC: 3.99 MIL/uL (ref 3.87–5.11)
RDW: 14.3 % (ref 11.5–15.5)
WBC: 5 10*3/uL (ref 4.0–10.5)
nRBC: 0 % (ref 0.0–0.2)

## 2018-09-24 LAB — TROPONIN I: Troponin I: <0.03 ng/mL (ref <0.03)

## 2018-09-24 LAB — SARS CORONAVIRUS 2 BY RT PCR (HOSPITAL ORDER, PERFORMED IN ~~LOC~~ HOSPITAL LAB): SARS Coronavirus 2: POSITIVE — AB

## 2018-09-24 LAB — BRAIN NATRIURETIC PEPTIDE: B Natriuretic Peptide: 434.7 pg/mL — ABNORMAL HIGH (ref 0.0–100.0)

## 2018-09-24 MED ORDER — PANTOPRAZOLE SODIUM 40 MG PO TBEC
40.0000 mg | DELAYED_RELEASE_TABLET | Freq: Every day | ORAL | Status: DC
  Administered 2018-09-25 – 2018-10-02 (×8): 40 mg via ORAL
  Filled 2018-09-24 (×8): qty 1

## 2018-09-24 MED ORDER — SODIUM CHLORIDE 0.9 % IV SOLN: 250.0000 mL | INTRAVENOUS | Status: DC | PRN

## 2018-09-24 MED ORDER — ONDANSETRON HCL 4 MG/2ML IJ SOLN: 4.0000 mg | Freq: Four times a day (QID) | INTRAMUSCULAR | Status: DC | PRN

## 2018-09-24 MED ORDER — SODIUM CHLORIDE 0.9% FLUSH
3.0000 mL | Freq: Two times a day (BID) | INTRAVENOUS | Status: DC
  Administered 2018-09-25 – 2018-10-02 (×13): 3 mL via INTRAVENOUS

## 2018-09-24 MED ORDER — SODIUM CHLORIDE 0.9% FLUSH
3.0000 mL | INTRAVENOUS | Status: DC | PRN
  Administered 2018-09-27: 3 mL via INTRAVENOUS
  Filled 2018-09-24: qty 3

## 2018-09-24 MED ORDER — FUROSEMIDE 10 MG/ML IJ SOLN
40.0000 mg | Freq: Once | INTRAMUSCULAR | Status: AC
  Administered 2018-09-24: 40 mg via INTRAVENOUS
  Filled 2018-09-24: qty 4

## 2018-09-24 MED ORDER — DULOXETINE HCL 60 MG PO CPEP
60.0000 mg | ORAL_CAPSULE | Freq: Every day | ORAL | Status: DC
  Administered 2018-09-25 – 2018-10-02 (×8): 60 mg via ORAL
  Filled 2018-09-24 (×8): qty 1

## 2018-09-24 MED ORDER — VITAMIN D (ERGOCALCIFEROL) 1.25 MG (50000 UNIT) PO CAPS
50000.0000 [IU] | ORAL_CAPSULE | ORAL | Status: DC
  Administered 2018-09-30: 50000 [IU] via ORAL
  Filled 2018-09-24: qty 1

## 2018-09-24 MED ORDER — SUPER B COMPLEX/C PO CAPS: 1.0000 | ORAL_CAPSULE | Freq: Every day | ORAL | Status: DC

## 2018-09-24 MED ORDER — INSULIN ASPART 100 UNIT/ML ~~LOC~~ SOLN
0.0000 [IU] | Freq: Three times a day (TID) | SUBCUTANEOUS | Status: DC
  Administered 2018-09-25: 13:00:00 2 [IU] via SUBCUTANEOUS
  Administered 2018-09-25: 17:00:00 1 [IU] via SUBCUTANEOUS
  Administered 2018-09-27 – 2018-09-28 (×2): 2 [IU] via SUBCUTANEOUS
  Administered 2018-09-30: 5 [IU] via SUBCUTANEOUS
  Administered 2018-10-01 (×2): 2 [IU] via SUBCUTANEOUS
  Administered 2018-10-01: 1 [IU] via SUBCUTANEOUS
  Administered 2018-10-02: 09:00:00 2 [IU] via SUBCUTANEOUS

## 2018-09-24 MED ORDER — FUROSEMIDE 10 MG/ML IJ SOLN
40.0000 mg | Freq: Two times a day (BID) | INTRAMUSCULAR | Status: DC
  Administered 2018-09-25 – 2018-09-27 (×6): 40 mg via INTRAVENOUS
  Filled 2018-09-24 (×7): qty 4

## 2018-09-24 MED ORDER — INSULIN GLARGINE 100 UNIT/ML ~~LOC~~ SOLN
40.0000 [IU] | Freq: Every day | SUBCUTANEOUS | Status: DC
  Administered 2018-09-24 – 2018-09-25 (×2): 40 [IU] via SUBCUTANEOUS
  Filled 2018-09-24 (×2): qty 0.4

## 2018-09-24 MED ORDER — LOSARTAN POTASSIUM 25 MG PO TABS
25.0000 mg | ORAL_TABLET | Freq: Every day | ORAL | Status: DC
  Filled 2018-09-24: qty 1

## 2018-09-24 MED ORDER — ACETAMINOPHEN 325 MG PO TABS
650.0000 mg | ORAL_TABLET | ORAL | Status: DC | PRN
  Administered 2018-09-24 – 2018-10-01 (×5): 650 mg via ORAL
  Filled 2018-09-24 (×5): qty 2

## 2018-09-24 MED ORDER — ATORVASTATIN CALCIUM 40 MG PO TABS
80.0000 mg | ORAL_TABLET | Freq: Every day | ORAL | Status: DC
  Administered 2018-09-25 – 2018-10-01 (×7): 80 mg via ORAL
  Filled 2018-09-24 (×7): qty 2

## 2018-09-24 MED ORDER — GABAPENTIN 300 MG PO CAPS
300.0000 mg | ORAL_CAPSULE | Freq: Two times a day (BID) | ORAL | Status: DC
  Administered 2018-09-24 – 2018-10-02 (×16): 300 mg via ORAL
  Filled 2018-09-24 (×17): qty 1

## 2018-09-24 MED ORDER — CARVEDILOL 12.5 MG PO TABS
12.5000 mg | ORAL_TABLET | Freq: Two times a day (BID) | ORAL | Status: DC
  Administered 2018-09-25 – 2018-10-02 (×15): 12.5 mg via ORAL
  Filled 2018-09-24 (×15): qty 1

## 2018-09-24 MED ORDER — ALLOPURINOL 100 MG PO TABS
300.0000 mg | ORAL_TABLET | Freq: Every day | ORAL | Status: DC
  Administered 2018-09-25 – 2018-10-02 (×8): 300 mg via ORAL
  Filled 2018-09-24 (×8): qty 3

## 2018-09-24 MED ORDER — AMLODIPINE BESYLATE 5 MG PO TABS
5.0000 mg | ORAL_TABLET | Freq: Every day | ORAL | Status: DC
  Administered 2018-09-24 – 2018-10-01 (×8): 5 mg via ORAL
  Filled 2018-09-24 (×9): qty 1

## 2018-09-24 MED ORDER — ONDANSETRON HCL 4 MG PO TABS: 4.0000 mg | ORAL_TABLET | Freq: Four times a day (QID) | ORAL | Status: DC | PRN

## 2018-09-24 MED ORDER — INSULIN ASPART 100 UNIT/ML ~~LOC~~ SOLN
0.0000 [IU] | Freq: Every day | SUBCUTANEOUS | Status: DC
  Administered 2018-09-25: 3 [IU] via SUBCUTANEOUS

## 2018-09-24 MED ORDER — TIZANIDINE HCL 2 MG PO TABS
4.0000 mg | ORAL_TABLET | Freq: Three times a day (TID) | ORAL | Status: DC | PRN
  Administered 2018-09-26: 4 mg via ORAL
  Filled 2018-09-24 (×2): qty 2

## 2018-09-24 MED ORDER — ENOXAPARIN SODIUM 40 MG/0.4ML ~~LOC~~ SOLN
40.0000 mg | SUBCUTANEOUS | Status: DC
  Administered 2018-09-25 – 2018-10-02 (×8): 40 mg via SUBCUTANEOUS
  Filled 2018-09-24 (×8): qty 0.4

## 2018-09-24 MED ORDER — ASPIRIN EC 81 MG PO TBEC
81.0000 mg | DELAYED_RELEASE_TABLET | Freq: Every day | ORAL | Status: DC
  Administered 2018-09-25 – 2018-10-02 (×8): 81 mg via ORAL
  Filled 2018-09-24 (×7): qty 1

## 2018-09-24 MED ORDER — B COMPLEX-C PO TABS
1.0000 | ORAL_TABLET | Freq: Every day | ORAL | Status: DC
  Administered 2018-09-25 – 2018-10-02 (×8): 1 via ORAL
  Filled 2018-09-24 (×8): qty 1

## 2018-09-24 NOTE — ED Triage Notes (Addendum)
Patient BIB EMS from home with complaints of breathing difficulty x2 weeks. Patient PCP called her today telling her she has pneumonia. Patient denies CP. Patient has hx of DM, MI, CHF, Stroke. Patient lives on [address]  Patient tested positive for COVID 19 in April 2020.  EMS VS: 98% 3LNC, 120/88, 80SR, 18RR, 97.60F Temporal, CBG=232.

## 2018-09-24 NOTE — ED Notes (Signed)
[facility] called for transport 

## 2018-09-24 NOTE — ED Notes (Signed)
Bed: WA22 Expected date:  Expected time:  Means of arrival:  Comments: EMS 

## 2018-09-24 NOTE — H&P (Addendum)
History and Physical   Kara Hanson TFT:[id number] DOB: 1955-04-29 DOA: 09/24/2018  Referring D/NP/PA: Dr. Ronnald Nian  PCP: [name], MD   Outpatient Specialists: None  Patient coming from: Home  Chief Complaint: Shortness of breath  HPI: Kara Hanson is a 63 y.o. female with medical history significant of Congestive heart failure, recent COVID-19 positive diagnosed on April 9th after a trip to [place], was admitted on April 17th of this year was in the hospital at [facility] off until the 22nd, patient felt better and was discharged home on oxygen therapy.  She is chronically on 3 L.  Also history of hypertension diabetes hyperlipidemia coronary artery disease and spinal stenosis.  Patient came back today due to worsening shortness of breath and cough.  She appears to have more fluid overload again.  She was called by her PCP after a chest x-ray apparently in the outpatient setting showing infiltrates.  She is requiring more oxygen than her 3 L.  Patient had no fever but is hypoxic again.  Also has cough as well as wheezing.  Suspected CHF exacerbation but also cough with positive once again.  Not sure if she got reinfected or patient is still not clearing her COVID-19 infection.  She is will be admitted to the [facility] with tentative diagnosis of CHF exacerbation and cough with pneumonia..  ED Course: Temperature is 98.1 blood pressure 191/81, pulse 76 respiratory rate of 26 oxygen sat 98% on room air.  White count is 5.0 hemoglobin 10.7 and platelet count of 279.  Glucose 222 creatinine 1.40 and the rest of the electrolytes appear to be within normal.  BNP 434.  CBC essentially within normal.  Chest x-ray shows slight worsening of CHF pattern.  Patient being admitted for further work-up.  Review of Systems: As per HPI otherwise 10 point review of systems negative.    Past Medical History:  Diagnosis Date  . CHF (congestive heart failure) ([place])   . CVA (cerebral  vascular accident) ([place])   . Diabetes mellitus without complication ([place])   . Hypercholesteremia   . Hypertension   . Myocardial infarction ([place])   . Spinal stenosis     Past Surgical History:  Procedure Laterality Date  . BLADDER SURGERY    . CARDIAC CATHETERIZATION N/A 04/25/2016   Procedure: Left Heart Cath and Coronary Angiography;  Surgeon: Lorretta Harp, MD;  Location: [facility];  Service: Cardiovascular;  Laterality: N/A;  . CESAREAN SECTION    . CHOLECYSTECTOMY       reports that she has never smoked. She has never used smokeless tobacco. She reports that she does not drink alcohol or use drugs.  Allergies  Allergen Reactions  . Garlic Shortness Of Breath, Itching and Swelling    Hand itching and swelling  . Latex Itching  . Morphine And Related Itching and Other (See Comments)    Headache   . Other Itching    Reaction to newspaper ink -itching and headache    Family History  Problem Relation Age of Onset  . Diabetes Mellitus II Father   . Stroke Father      Prior to Admission medications   Medication Sig Start Date End Date Taking? Authorizing Provider  allopurinol (ZYLOPRIM) 300 MG tablet Take 1 tablet (300 mg total) by mouth daily. 09/17/18  Yes [name], MD  amLODipine (NORVASC) 5 MG tablet TAKE 1 TABLET (5 MG TOTAL) BY MOUTH AT BEDTIME. 09/01/18 10/01/18 Yes [name], MD  aspirin EC 81 MG tablet Take 81 mg by mouth daily.   Yes [provider]  atorvastatin (LIPITOR) 80 MG tablet Take 1 tablet (80 mg total) by mouth daily at 6 PM for 30 days. 09/17/18 10/17/18 Yes [name], MD  carvedilol (COREG) 12.5 MG tablet Take 1 tablet (12.5 mg total) by mouth 2 (two) times daily with a meal. 09/17/18  Yes Newlin, Enobong, MD  DULoxetine (CYMBALTA) 60 MG capsule Take 1 capsule (60 mg total) by mouth daily. For chronic back and neck pain 08/07/18  Yes Newlin, Charlane Ferretti, MD  ergocalciferol (DRISDOL) 1.25 MG (50000 UT) capsule Take 1 capsule  (50,000 Units total) by mouth once a week. 09/18/18  Yes [name], MD  furosemide (LASIX) 40 MG tablet Take 1.5 tablets (60 mg total) by mouth 2 (two) times daily. 09/17/18  Yes Newlin, Charlane Ferretti, MD  gabapentin (NEURONTIN) 300 MG capsule TAKE 1 CAPSULE (300 MG TOTAL) BY MOUTH 2 (TWO) TIMES DAILY. 09/03/18 10/03/18 Yes [name], MD  insulin aspart (NOVOLOG) 100 UNIT/ML injection 0 to 12 units subcutaneously 3 times daily before meals as per sliding scale 03/29/18  Yes Newlin, Enobong, MD  Insulin Glargine (BASAGLAR KWIKPEN) 100 UNIT/ML SOPN Inject 0.4 mLs (40 Units total) into the skin at bedtime. 07/31/18  Yes [name], MD  losartan (COZAAR) 25 MG tablet Take 25 mg by mouth daily.   Yes [provider]  omeprazole (PRILOSEC) 20 MG capsule Take 20 mg by mouth daily.   Yes [provider]  ondansetron (ZOFRAN) 4 MG tablet Take 1 tablet (4 mg total) by mouth every 6 (six) hours as needed for nausea. 07/25/18  Yes Charlynne Cousins, MD  SUPER B COMPLEX/C CAPS Take 1 capsule by mouth daily.   Yes [provider]  tiZANidine (ZANAFLEX) 4 MG tablet TAKE 1 TABLET (4 MG TOTAL) BY MOUTH EVERY 8 (EIGHT) HOURS AS NEEDED FOR MUSCLE SPASMS. 09/01/18  Yes [name], MD  Accu-Chek FastClix Lancets MISC USE AS DIRECTED TO TEST BLOOD SUGAR THREE TIMES DAILY 07/31/18   [name], MD  Blood Glucose Monitoring Suppl (ACCU-CHEK GUIDE) w/Device KIT 1 each by Does not apply route 3 (three) times daily. 07/31/18   [name], MD  dicyclomine (BENTYL) 20 MG tablet Take 1 tablet (20 mg total) by mouth 2 (two) times daily. Patient not taking: Reported on 03/20/2018 01/02/18   Maudie Flakes, MD  ezetimibe (ZETIA) 10 MG tablet Take 1 tablet (10 mg total) by mouth daily. Patient not taking: Reported on 09/24/2018 03/25/18 04/24/18  Elodia Florence., MD  glucose blood (ACCU-CHEK GUIDE) test strip USE AS DIRECTED TO TEST BLOOD SUGAR THREE TIMES DAILY 07/31/18   [name], MD  Insulin Pen Needle 31G X 5 MM MISC 1 each by Does not apply route at bedtime. 12/13/17   [name], MD  Lancet Device MISC Use as instructed 3 times daily 09/19/17   Rodell Perna A, PA-C  lansoprazole (PREVACID) 15 MG capsule Take 1 capsule (15 mg total) by mouth daily. Patient not taking: Reported on 07/20/2018 03/29/18   [name], MD  metoCLOPramide (REGLAN) 5 MG tablet Take 1 tablet (5 mg total) by mouth 3 (three) times daily before meals. Patient not taking: Reported on 03/20/2018 11/14/17   [name], MD  Misc. Devices MISC Portable oxygen concentrator.  Diagnosis-chronic respiratory failure. 09/03/18   [name], MD    Physical Exam: Vitals:   09/24/18 2130 09/24/18 2200 09/24/18 2230 09/24/18 2309  BP: (!) 180/75 (!) 171/80 (!) 182/73   Pulse: 66 70 74   Resp: 17 20 (!) 21   Temp:    98.1 F (36.7 C)  TempSrc:    Oral  SpO2: 100% 99% 98%   Weight:      Height:          Constitutional: Acutely ill looking, appears to be short of breath Vitals:   09/24/18 2130 09/24/18 2200 09/24/18 2230 09/24/18 2309  BP: (!) 180/75 (!) 171/80 (!) 182/73   Pulse: 66 70 74   Resp: 17 20 (!) 21   Temp:    98.1 F (36.7 C)  TempSrc:    Oral  SpO2: 100% 99% 98%   Weight:      Height:       Eyes: PERRL, lids and conjunctivae normal ENMT: Mucous membranes are moist. Posterior pharynx clear of any exudate or lesions.Normal dentition.  Neck: normal, supple, no masses, no thyromegaly Respiratory: Decreased air entry bilaterally with marked rhonchi, crackles and mild expiratory wheezing normal respiratory effort. No accessory muscle use.  Cardiovascular: Regular rate and rhythm, no murmurs / rubs / gallops.  2+ pedal edema. 2+ pedal pulses. No carotid bruits.  Abdomen: no tenderness, no masses palpated. No hepatosplenomegaly. Bowel sounds positive.  Musculoskeletal: no clubbing / cyanosis. No joint deformity upper and lower extremities. Good ROM, no  contractures. Normal muscle tone.  Skin: no rashes, lesions, ulcers. No induration Neurologic: CN 2-12 grossly intact. Sensation intact, DTR normal. Strength 5/5 in all 4.  Psychiatric: Normal judgment and insight. Alert and oriented x 3. Normal mood.     Labs on Admission: I have personally reviewed following labs and imaging studies  CBC: Recent Labs  Lab 09/24/18 1844  WBC 5.0  NEUTROABS 3.7  HGB 10.7*  HCT 35.5*  MCV 89.0  PLT 161   Basic Metabolic Panel: Recent Labs  Lab 09/24/18 1844  NA 142  K 3.5  CL 105  CO2 26  GLUCOSE 222*  BUN 29*  CREATININE 1.40*  CALCIUM 9.4   GFR: Estimated Creatinine Clearance: 35.2 mL/min (A) (by C-G formula based on SCr of 1.4 mg/dL (H)). Liver Function Tests: No results for input(s): AST, ALT, ALKPHOS, BILITOT, PROT, ALBUMIN in the last 168 hours. No results for input(s): LIPASE, AMYLASE in the last 168 hours. No results for input(s): AMMONIA in the last 168 hours. Coagulation Profile: No results for input(s): INR, PROTIME in the last 168 hours. Cardiac Enzymes: Recent Labs  Lab 09/24/18 1844  TROPONINI <0.03   BNP (last 3 results) No results for input(s): PROBNP in the last 8760 hours. HbA1C: No results for input(s): HGBA1C in the last 72 hours. CBG: No results for input(s): GLUCAP in the last 168 hours. Lipid Profile: No results for input(s): CHOL, HDL, LDLCALC, TRIG, CHOLHDL, LDLDIRECT in the last 72 hours. Thyroid Function Tests: No results for input(s): TSH, T4TOTAL, FREET4, T3FREE, THYROIDAB in the last 72 hours. Anemia Panel: No results for input(s): VITAMINB12, FOLATE, FERRITIN, TIBC, IRON, RETICCTPCT in the last 72 hours. Urine analysis:    Component Value Date/Time   COLORURINE YELLOW 01/02/2018 1452   APPEARANCEUR HAZY (A) 01/02/2018 1452   LABSPEC 1.039 (H) 01/02/2018 1452   PHURINE 6.0 01/02/2018 1452   GLUCOSEU >=500 (A) 01/02/2018 1452   HGBUR NEGATIVE 01/02/2018 1452   BILIRUBINUR NEGATIVE  01/02/2018 1452   BILIRUBINUR negative 12/13/2017 [place] 01/02/2018 1452   PROTEINUR >=300 (A) 01/02/2018 1452   UROBILINOGEN 0.2  12/13/2017 1514   NITRITE NEGATIVE 01/02/2018 1452   LEUKOCYTESUR NEGATIVE 01/02/2018 1452   Sepsis Labs: _0 (procalcitonin:4,lacticidven:4) ) Recent Results (from the past 240 hour(s))  SARS Coronavirus 2 (CEPHEID- Performed in [facility] hospital lab), Hosp Order     Status: Abnormal   Collection Time: 09/24/18  6:40 PM   Specimen: Nasopharyngeal Swab  Result Value Ref Range Status   SARS Coronavirus 2 POSITIVE (A) NEGATIVE Final    Comment: RESULT CALLED TO, READ BACK BY AND VERIFIED WITH: Kathaleen Bury. RN _1  ON 06.22.2020 BY COHEN,K (NOTE) If result is NEGATIVE SARS-CoV-2 target nucleic acids are NOT DETECTED. The SARS-CoV-2 RNA is generally detectable in upper and lower  respiratory specimens during the acute phase of infection. The lowest  concentration of SARS-CoV-2 viral copies this assay can detect is 250  copies / mL. A negative result does not preclude SARS-CoV-2 infection  and should not be used as the sole basis for treatment or other  patient management decisions.  A negative result may occur with  improper specimen collection / handling, submission of specimen other  than nasopharyngeal swab, presence of viral mutation(s) within the  areas targeted by this assay, and inadequate number of viral copies  (<250 copies / mL). A negative result must be combined with clinical  observations, patient history, and epidemiological information. If result is POSITIVE SARS-CoV-2 target nucleic acids are DETE CTED. The SARS-CoV-2 RNA is generally detectable in upper and lower  respiratory specimens during the acute phase of infection.  Positive  results are indicative of active infection with SARS-CoV-2.  Clinical  correlation with patient history and other diagnostic information is  necessary to determine patient infection  status.  Positive results do  not rule out bacterial infection or co-infection with other viruses. If result is PRESUMPTIVE POSTIVE SARS-CoV-2 nucleic acids MAY BE PRESENT.   A presumptive positive result was obtained on the submitted specimen  and confirmed on repeat testing.  While 2019 novel coronavirus  (SARS-CoV-2) nucleic acids may be present in the submitted sample  additional confirmatory testing may be necessary for epidemiological  and / or clinical management purposes  to differentiate between  SARS-CoV-2 and other Sarbecovirus currently known to infect humans.  If clinically indicated additional testing with an alternate test  methodology (LAB [id number]) is advised. The SARS-CoV-2 RNA is generally  detectable in upper and lower respiratory specimens during the acute  phase of infection. The expected result is Negative. Fact Sheet for Patients:  StrictlyIdeas.no Fact Sheet for Healthcare Providers: BankingDealers.co.za This test is not yet approved or cleared by the [place] FDA and has been authorized for detection and/or diagnosis of SARS-CoV-2 by FDA under an Emergency Use Authorization (EUA).  This EUA will remain in effect (meaning this test can be used) for the duration of the COVID-19 declaration under Section 564(b)(1) of the Act, 21 U.S.C. section 360bbb-3(b)(1), unless the authorization is terminated or revoked sooner. Performed at [facility], [place] [address], [place], [place] [address]      Radiological Exams on Admission: Dg Chest Portable 1 View  Result Date: 09/24/2018 CLINICAL DATA:  CHF, respiratory failure, shortness of breath EXAM: PORTABLE CHEST 1 VIEW COMPARISON:  09/20/2018 FINDINGS: Cardiomegaly noted with increased vascular congestion and perihilar/basilar interstitial edema pattern with pleural effusions. Pattern compatible with CHF. There is associated bibasilar partial  collapse/consolidation. No pneumothorax. Trachea midline. Degenerative changes of the spine. IMPRESSION: Slight worsening CHF pattern compared to 09/20/2018. Electronically Signed   By: Jerilynn Mages.  Shick  M.D.   On: 09/24/2018 19:22    EKG: Independently reviewed.  It shows sinus rhythm with a rate of 72, normal intervals.  Flipped T waves in the lateral leads.  It appears unchanged from previous EKG in April of this year.  Assessment/Plan Principal Problem:   Acute on chronic diastolic CHF (congestive heart failure) (HCC) Active Problems:   Controlled type 2 diabetes mellitus with hyperglycemia (HCC)   Hypertension   Normochromic normocytic anemia   CKD (chronic kidney disease), stage III ([place])   COVID-19 virus infection     #1 acute on chronic respiratory failure: Suspected CHF exacerbation and COVID pneumonia.  Admit the patient and diurese.  Continue with supportive care for cough with infection.  Admit to [facility].  Elevate feet and continue care.  #2 CHF exacerbation: We will continue with other cardiac medications.  Patient's last echocardiogram was more than 6 months ago.  Repeat echocardiogram.  Previous EF was 55 to 60% and has diastolic dysfunction.  #3 COVID-19 infection: Patient has been treated for COVID-19 back in April.  Is been 2 months.  She now has another positive finding.  Not clear if this is left over from previous infection or it is a new infection.  We will nevertheless treat the patient as an active infection.  With her worsening oxygen demand will continue with supportive care and treatment for cough with pneumonia.  #4 diabetes: Blood sugar appears controlled.  Will start sliding scale insulin.  #5 hypertension: Blood pressures well controlled.  Continue blood pressure management.  #6 chronic kidney disease stage III: Stable at baseline.  #7 normocytic anemia: H&H stable at baseline.  Continue to monitoring.  DVT prophylaxis: Lovenox Code Status: Full  code Family Communication: No family at bedside Disposition Plan: To be determined Consults called: None Admission status: Inpatient  Severity of Illness: The appropriate patient status for this patient is INPATIENT. Inpatient status is judged to be reasonable and necessary in order to provide the required intensity of service to ensure the patient's safety. The patient's presenting symptoms, physical exam findings, and initial radiographic and laboratory data in the context of their chronic comorbidities is felt to place them at high risk for further clinical deterioration. Furthermore, it is not anticipated that the patient will be medically stable for discharge from the hospital within 2 midnights of admission. The following factors support the patient status of inpatient.   " The patient's presenting symptoms include shortness of breath and cough. " The worrisome physical exam findings include bilateral crackles and wheeze. " The initial radiographic and laboratory data are worrisome because of bilateral infiltrates with fluid. " The chronic co-morbidities include diastolic CHF.   * I certify that at the point of admission it is my clinical judgment that the patient will require inpatient hospital care spanning beyond 2 midnights from the point of admission due to high intensity of service, high risk for further deterioration and high frequency of surveillance required.Barbette Merino MD [facility] Pager [phone]  If 7PM-7AM, please contact night-coverage [email or web address] Password [facility]  09/24/2018, 11:25 PM

## 2018-09-24 NOTE — ED Notes (Signed)
Admitting MD notified of pt COVID positive result.

## 2018-09-24 NOTE — ED Notes (Signed)
EDMD at bedside

## 2018-09-24 NOTE — Telephone Encounter (Signed)
[facility] contacted and told that patient would be presenting with lung infiltrates.  Charge Nurse acknowledged information

## 2018-09-24 NOTE — ED Provider Notes (Addendum)
[place] DEPT Provider Note   CSN: [id number] Arrival date & time: 09/24/18  1724    History   Chief Complaint Chief Complaint  Patient presents with  . Shortness of Breath    HPI Kara Hanson is a 63 y.o. female.     The history is provided by the patient.  Shortness of Breath Severity:  Moderate Onset quality:  Gradual Timing:  Constant Progression:  Worsening Chronicity:  Recurrent Context comment:  Hx of HF, HLD, DM with worsening symptoms over the last few days. No fever. Relieved by:  Nothing Worsened by:  Exertion Ineffective treatments:  None tried Associated symptoms: no abdominal pain, no chest pain, no cough, no ear pain, no fever, no rash, no sore throat, no vomiting and no wheezing     Past Medical History:  Diagnosis Date  . CHF (congestive heart failure) ([place])   . CVA (cerebral vascular accident) ([place])   . Diabetes mellitus without complication ([place])   . Hypercholesteremia   . Hypertension   . Myocardial infarction ([place])   . Spinal stenosis     Patient Active Problem List   Diagnosis Date Noted  . SIRS (systemic inflammatory response syndrome) ([place]) 07/25/2018  . COVID-19 virus infection 07/20/2018  . Chronic diastolic CHF (congestive heart failure) ([place]) 07/20/2018  . Pneumonia due to COVID-19 virus 07/20/2018  . Vitamin D deficiency 04/02/2018  . Spinal stenosis 03/29/2018  . Acute and chronic respiratory failure with hypoxia ([place]) 03/20/2018  . Diabetes mellitus type 2 with complications, uncontrolled ([place]) 03/20/2018  . CKD (chronic kidney disease), stage III ([place]) 03/20/2018  . Acute on chronic diastolic CHF (congestive heart failure) ([place]) 11/27/2017  . Acute kidney injury ([place]) 11/27/2017  . Acute respiratory distress 11/27/2017  . Bulging lumbar disc 11/14/2017  . Gout 11/14/2017  . HLD (hyperlipidemia) 09/30/2016  . Chest pain 06/17/2016  . Stress-induced cardiomyopathy 05/19/2016  . Type 2  diabetes mellitus with diabetic neuropathy, unspecified ([place]) 05/06/2016  . Vertigo 05/06/2016  . Controlled type 2 diabetes mellitus with hyperglycemia ([place]) 04/23/2016  . Hypertension 04/23/2016  . Normochromic normocytic anemia 04/23/2016  . Syncope 04/23/2016    Past Surgical History:  Procedure Laterality Date  . BLADDER SURGERY    . CARDIAC CATHETERIZATION N/A 04/25/2016   Procedure: Left Heart Cath and Coronary Angiography;  Surgeon: Lorretta Harp, MD;  Location: [facility];  Service: Cardiovascular;  Laterality: N/A;  . CESAREAN SECTION    . CHOLECYSTECTOMY       OB History   No obstetric history on file.      Home Medications    Prior to Admission medications   Medication Sig Start Date End Date Taking? Authorizing Provider  Accu-Chek FastClix Lancets MISC USE AS DIRECTED TO TEST BLOOD SUGAR THREE TIMES DAILY 07/31/18   [name], MD  allopurinol (ZYLOPRIM) 300 MG tablet Take 1 tablet (300 mg total) by mouth daily. 09/17/18   [name], MD  amLODipine (NORVASC) 5 MG tablet TAKE 1 TABLET (5 MG TOTAL) BY MOUTH AT BEDTIME. 09/01/18 10/01/18  [name], MD  aspirin EC 81 MG tablet Take 81 mg by mouth daily.    [provider]  atorvastatin (LIPITOR) 80 MG tablet Take 1 tablet (80 mg total) by mouth daily at 6 PM for 30 days. 09/17/18 10/17/18  [name], MD  Blood Glucose Monitoring Suppl (ACCU-CHEK GUIDE) w/Device KIT 1 each by Does not apply route 3 (three) times daily. 07/31/18   Newlin, Charlane Ferretti,  MD  carvedilol (COREG) 12.5 MG tablet Take 1 tablet (12.5 mg total) by mouth 2 (two) times daily with a meal. 09/17/18   [name], MD  dicyclomine (BENTYL) 20 MG tablet Take 1 tablet (20 mg total) by mouth 2 (two) times daily. Patient not taking: Reported on 03/20/2018 01/02/18   Maudie Flakes, MD  DULoxetine (CYMBALTA) 60 MG capsule Take 1 capsule (60 mg total) by mouth daily. For chronic back and neck pain 08/07/18   [name], MD   ergocalciferol (DRISDOL) 1.25 MG (50000 UT) capsule Take 1 capsule (50,000 Units total) by mouth once a week. 09/18/18   [name], MD  ezetimibe (ZETIA) 10 MG tablet Take 1 tablet (10 mg total) by mouth daily. 03/25/18 04/24/18  Elodia Florence., MD  furosemide (LASIX) 40 MG tablet Take 1.5 tablets (60 mg total) by mouth 2 (two) times daily. 09/17/18   [name], MD  gabapentin (NEURONTIN) 300 MG capsule TAKE 1 CAPSULE (300 MG TOTAL) BY MOUTH 2 (TWO) TIMES DAILY. 09/03/18 10/03/18  [name], MD  glucose blood (ACCU-CHEK GUIDE) test strip USE AS DIRECTED TO TEST BLOOD SUGAR THREE TIMES DAILY 07/31/18   [name], MD  insulin aspart (NOVOLOG) 100 UNIT/ML injection 0 to 12 units subcutaneously 3 times daily before meals as per sliding scale 03/29/18   [name], MD  Insulin Glargine (BASAGLAR KWIKPEN) 100 UNIT/ML SOPN Inject 0.4 mLs (40 Units total) into the skin at bedtime. 07/31/18   [name], MD  Insulin Pen Needle 31G X 5 MM MISC 1 each by Does not apply route at bedtime. 12/13/17   [name], MD  Lancet Device MISC Use as instructed 3 times daily 09/19/17   Rodell Perna A, PA-C  lansoprazole (PREVACID) 15 MG capsule Take 1 capsule (15 mg total) by mouth daily. Patient not taking: Reported on 07/20/2018 03/29/18   [name], MD  losartan (COZAAR) 25 MG tablet Take 25 mg by mouth daily.    [provider]  metoCLOPramide (REGLAN) 5 MG tablet Take 1 tablet (5 mg total) by mouth 3 (three) times daily before meals. Patient not taking: Reported on 03/20/2018 11/14/17   [name], MD  Misc. Devices MISC Portable oxygen concentrator.  Diagnosis-chronic respiratory failure. 09/03/18   [name], MD  omeprazole (PRILOSEC) 20 MG capsule Take 20 mg by mouth daily.    [provider]  ondansetron (ZOFRAN) 4 MG tablet Take 1 tablet (4 mg total) by mouth every 6 (six) hours as needed for nausea. 07/25/18   Charlynne Cousins, MD  SUPER B  COMPLEX/C CAPS Take 1 capsule by mouth daily.    [provider]  tiZANidine (ZANAFLEX) 4 MG tablet TAKE 1 TABLET (4 MG TOTAL) BY MOUTH EVERY 8 (EIGHT) HOURS AS NEEDED FOR MUSCLE SPASMS. 09/01/18   [name], MD    Family History Family History  Problem Relation Age of Onset  . Diabetes Mellitus II Father   . Stroke Father     Social History Social History   Tobacco Use  . Smoking status: Never Smoker  . Smokeless tobacco: Never Used  Substance Use Topics  . Alcohol use: No  . Drug use: No     Allergies   Garlic, Latex, Morphine and related, and Other   Review of Systems Review of Systems  Constitutional: Negative for chills and fever.  HENT: Negative for ear pain and sore throat.   Eyes: Negative for pain and visual disturbance.  Respiratory: Positive for  shortness of breath. Negative for cough and wheezing.   Cardiovascular: Positive for leg swelling. Negative for chest pain and palpitations.  Gastrointestinal: Negative for abdominal pain and vomiting.  Genitourinary: Negative for dysuria and hematuria.  Musculoskeletal: Negative for arthralgias and back pain.  Skin: Negative for color change and rash.  Neurological: Negative for seizures and syncope.  All other systems reviewed and are negative.    Physical Exam Updated Vital Signs  ED Triage Vitals [09/24/18 1748]  Enc Vitals Group     BP (!) 186/74     Pulse Rate 76     Resp 19     Temp 97.9 F (36.6 C)     Temp Source Oral     SpO2 100 %     Weight 156 lb (70.8 kg)     Height 4' 11"  (1.499 m)     Head Circumference      Peak Flow      Pain Score      Pain Loc      Pain Edu?      Excl. in [place]?     Physical Exam Vitals signs and nursing note reviewed.  Constitutional:      General: She is not in acute distress.    Appearance: She is well-developed.  HENT:     Head: Normocephalic and atraumatic.  Eyes:     Conjunctiva/sclera: Conjunctivae normal.     Pupils: Pupils are equal,  round, and reactive to light.  Neck:     Musculoskeletal: Normal range of motion and neck supple.  Cardiovascular:     Rate and Rhythm: Normal rate and regular rhythm.     Heart sounds: No murmur.  Pulmonary:     Effort: Tachypnea present. No respiratory distress.     Breath sounds: Decreased breath sounds and rales present.     Comments: 3L baseline Abdominal:     Palpations: Abdomen is soft.     Tenderness: There is no abdominal tenderness.  Musculoskeletal:     Right lower leg: Edema present.     Left lower leg: Edema present.  Skin:    General: Skin is warm and dry.  Neurological:     General: No focal deficit present.     Mental Status: She is alert.  Psychiatric:        Mood and Affect: Mood normal.      ED Treatments / Results  Labs (all labs ordered are listed, but only abnormal results are displayed) Labs Reviewed  CBC WITH DIFFERENTIAL/PLATELET - Abnormal; Notable for the following components:      Result Value   Hemoglobin 10.7 (*)    HCT 35.5 (*)    All other components within normal limits  BRAIN NATRIURETIC PEPTIDE - Abnormal; Notable for the following components:   B Natriuretic Peptide 434.7 (*)    All other components within normal limits  BASIC METABOLIC PANEL - Abnormal; Notable for the following components:   Glucose, Bld 222 (*)    BUN 29 (*)    Creatinine, Ser 1.40 (*)    GFR calc non Af Amer 40 (*)    GFR calc Af Amer 46 (*)    All other components within normal limits  SARS CORONAVIRUS 2 (HOSPITAL ORDER, [place] [facility])  TROPONIN I    EKG EKG Interpretation  Date/Time:  Monday September 24 2018 18:55:01 EDT Ventricular Rate:  74 PR Interval:    QRS Duration: 92 QT Interval:  413 QTC Calculation:  459 R Axis:   82 Text Interpretation:  Sinus rhythm Borderline right axis deviation Borderline low voltage, extremity leads Abnormal T, consider ischemia, diffuse leads Confirmed by Lennice Sites [phone]) on 09/24/2018  7:06:02 PM   Radiology Dg Chest Portable 1 View  Result Date: 09/24/2018 CLINICAL DATA:  CHF, respiratory failure, shortness of breath EXAM: PORTABLE CHEST 1 VIEW COMPARISON:  09/20/2018 FINDINGS: Cardiomegaly noted with increased vascular congestion and perihilar/basilar interstitial edema pattern with pleural effusions. Pattern compatible with CHF. There is associated bibasilar partial collapse/consolidation. No pneumothorax. Trachea midline. Degenerative changes of the spine. IMPRESSION: Slight worsening CHF pattern compared to 09/20/2018. Electronically Signed   By: Jerilynn Mages.  Shick M.D.   On: 09/24/2018 19:22    Procedures Procedures (including critical care time)  Medications Ordered in ED Medications  furosemide (LASIX) injection 40 mg (40 mg Intravenous Given 09/24/18 2013)     Initial Impression / Assessment and Plan / ED Course  I have reviewed the triage vital signs and the nursing notes.  Pertinent labs & imaging results that were available during my care of the patient were reviewed by me and considered in my medical decision making (see chart for details).     Kara Hanson is a 63 year old female with history of heart failure, hypertension, high cholesterol who presents to the ED with shortness of breath.  Patient chronically on 3 L of oxygen.  Patient hypertensive, tachypneic upon initial evaluation.  Appears volume overloaded.  Edema in her legs.  Rales on exam.  Patient states that she is gained some weight.  Patient with x-ray concerning for worsening heart failure.  BNP elevated.  Troponin normal.  EKG reassuring.  Otherwise no significant electrolyte abnormality, kidney injury, leukocytosis, anemia.  Overall patient with worsening heart failure symptoms with exam and lab findings consistent with the same.  Believe she would benefit from IV diuretics given her increased work of breathing.  Patient was given IV Lasix.  Admitted to medicine for further care. Of note, patient  states COVID positive weeks ago, will re-check.  This chart was dictated using voice recognition software.  Despite best efforts to proofread,  errors can occur which can change the documentation meaning.    Final Clinical Impressions(s) / ED Diagnoses   Final diagnoses:  Shortness of breath  Acute on chronic congestive heart failure, unspecified heart failure type [facility])    ED Discharge Orders    None       Lennice Sites, DO 09/24/18 2018    Lennice Sites, DO 09/24/18 2040

## 2018-09-24 NOTE — ED Notes (Signed)
Attempted to call report at [facility]. RN request to call back in 10.

## 2018-09-25 ENCOUNTER — Inpatient Hospital Stay (HOSPITAL_COMMUNITY): Payer: Self-pay

## 2018-09-25 DIAGNOSIS — I10 Essential (primary) hypertension: Secondary | ICD-10-CM

## 2018-09-25 DIAGNOSIS — I5033 Acute on chronic diastolic (congestive) heart failure: Secondary | ICD-10-CM

## 2018-09-25 DIAGNOSIS — N183 Chronic kidney disease, stage 3 (moderate): Secondary | ICD-10-CM

## 2018-09-25 DIAGNOSIS — E1165 Type 2 diabetes mellitus with hyperglycemia: Secondary | ICD-10-CM

## 2018-09-25 LAB — CBC WITH DIFFERENTIAL/PLATELET
Abs Immature Granulocytes: 0 10*3/uL (ref 0.00–0.07)
Basophils Absolute: 0 10*3/uL (ref 0.0–0.1)
Basophils Relative: 0 %
Eosinophils Absolute: 0.1 10*3/uL (ref 0.0–0.5)
Eosinophils Relative: 3 %
HCT: 31.8 % — ABNORMAL LOW (ref 36.0–46.0)
Hemoglobin: 9.7 g/dL — ABNORMAL LOW (ref 12.0–15.0)
Immature Granulocytes: 0 %
Lymphocytes Relative: 18 %
Lymphs Abs: 0.9 10*3/uL (ref 0.7–4.0)
MCH: 27.2 pg (ref 26.0–34.0)
MCHC: 30.5 g/dL (ref 30.0–36.0)
MCV: 89.1 fL (ref 80.0–100.0)
Monocytes Absolute: 0.5 10*3/uL (ref 0.1–1.0)
Monocytes Relative: 9 %
Neutro Abs: 3.4 10*3/uL (ref 1.7–7.7)
Neutrophils Relative %: 70 %
Platelets: 276 10*3/uL (ref 150–400)
RBC: 3.57 MIL/uL — ABNORMAL LOW (ref 3.87–5.11)
RDW: 14.4 % (ref 11.5–15.5)
WBC: 4.8 10*3/uL (ref 4.0–10.5)
nRBC: 0 % (ref 0.0–0.2)

## 2018-09-25 LAB — BASIC METABOLIC PANEL
Anion gap: 11 (ref 5–15)
BUN: 28 mg/dL — ABNORMAL HIGH (ref 8–23)
CO2: 27 mmol/L (ref 22–32)
Calcium: 9.1 mg/dL (ref 8.9–10.3)
Chloride: 104 mmol/L (ref 98–111)
Creatinine, Ser: 1.33 mg/dL — ABNORMAL HIGH (ref 0.44–1.00)
GFR calc Af Amer: 49 mL/min — ABNORMAL LOW (ref >60)
GFR calc non Af Amer: 42 mL/min — ABNORMAL LOW (ref >60)
Glucose, Bld: 169 mg/dL — ABNORMAL HIGH (ref 70–99)
Potassium: 3 mmol/L — ABNORMAL LOW (ref 3.5–5.1)
Sodium: 142 mmol/L (ref 135–145)

## 2018-09-25 LAB — PROCALCITONIN: Procalcitonin: <0.1 ng/mL

## 2018-09-25 LAB — FERRITIN: Ferritin: 62 ng/mL (ref 11–307)

## 2018-09-25 LAB — C-REACTIVE PROTEIN: CRP: <0.8 mg/dL (ref <1.0)

## 2018-09-25 LAB — LACTATE DEHYDROGENASE: LDH: 157 U/L (ref 98–192)

## 2018-09-25 LAB — D-DIMER, QUANTITATIVE: D-Dimer, Quant: 1.27 ug/mL-FEU — ABNORMAL HIGH (ref 0.00–0.50)

## 2018-09-25 LAB — GLUCOSE, CAPILLARY
Glucose-Capillary: 162 mg/dL — ABNORMAL HIGH (ref 70–99)
Glucose-Capillary: 98 mg/dL (ref 70–99)
Glucose-Capillary: 197 mg/dL — ABNORMAL HIGH (ref 70–99)

## 2018-09-25 MED ORDER — POTASSIUM CHLORIDE CRYS ER 20 MEQ PO TBCR
40.0000 meq | EXTENDED_RELEASE_TABLET | ORAL | Status: AC
  Administered 2018-09-25 (×2): 40 meq via ORAL
  Filled 2018-09-25 (×2): qty 2

## 2018-09-25 NOTE — Progress Notes (Signed)
PROGRESS NOTE                                                                                                                                                                                                             Patient Demographics:    Kara Hanson, is a 63 y.o. female, DOB - 1955/05/30, ZOX:[id number]  Admit date - 09/24/2018   Admitting Physician Elwyn Reach, MD  Outpatient Primary MD for the patient is [name], MD  LOS - 1   Chief Complaint  Patient presents with  . Shortness of Breath       Brief Narrative   63 y.o. female with medical history significant of Congestive heart failure, recent COVID-19 positive diagnosed on April 9th, discharged from [facility] on the 22nd, he was discharged on 3 L nasal cannula, reports worsening dyspnea over the last week, lower extremity edema, no improvement with increasing her Lasix dose by PCP, presents to ED with dyspnea, work-up significant for CHF, transferred to [facility] further care, given her COVID-19 test remains positive.    Subjective:    Kara Hanson today she does report some weakness, dyspnea, denies any chest pain .   Assessment  & Plan :    Principal Problem:   Acute on chronic diastolic CHF (congestive heart failure) (HCC) Active Problems:   Controlled type 2 diabetes mellitus with hyperglycemia (HCC)   Hypertension   Normochromic normocytic anemia   CKD (chronic kidney disease), stage III ([place])   COVID-19 virus infection  Acute on chronic diastolic CHF -Patient with known history of diastolic CHF, most recent echo in December 2019, significant for EF 55 to 60%, with grade 2 diastolic dysfunction. -Patient reports shehad her oral Lasix dose increased by PCP over last 5 days, without significant help -With evidence of volume overload, +2 edema, evaded proBNP, and volume overload on imaging -We will start on Lasix 40 mg IV twice daily, monitor electrolytes and  renal function closely, daily weight and strict ins and out.  Chronic Hypoxic respiratory failure -[place] at baseline, on 3 L nasal cannula  COVID  19+ -Patient COVID-19 test on admission remains positive, is most likely related to her previous infection, no indication to treat, no signs of acute active infection, given inflammatory markers within normal limit she does not have any symptoms related to COVID-19 currently.  COVID-19 Labs  Recent Labs    09/25/18 0316  DDIMER 1.27*  FERRITIN 62  LDH 157  CRP <0.8    Lab Results  Component Value Date   SARSCOV2NAA POSITIVE (A) 09/24/2018   Diabetes Mellitus  -Continue with home dose Lantus, will add insulin sliding scale  Hypertension - will hold losartan given worsening creatinine, resume other home meds  Hyperlipidemia - resume home dose statin  AKI and CKD stage III -Continue to monitor closely as on IV diuresis, avoid nephrotoxic medication, I will hold losartan    Code Status : Full  Family Communication  : D/W patient  Disposition Plan  : Home when stable  Barriers For Discharge : remains on IV diuresis  Consults  :  None  Procedures  : None  DVT Prophylaxis  :  [place] lovenox.  Lab Results  Component Value Date   PLT 276 09/25/2018    Antibiotics  :    Anti-infectives (From admission, onward)   None        Objective:   Vitals:   09/24/18 2230 09/24/18 2309 09/25/18 0414 09/25/18 0647  BP: (!) 182/73  (!) 157/64 (!) 143/75  Pulse: 74  68 67  Resp: (!) 21  14 (!) 21  Temp:  98.1 F (36.7 C) 98.4 F (36.9 C) 97.7 F (36.5 C)  TempSrc:  Oral Oral Oral  SpO2: 98%  97% 95%  Weight:      Height:        Wt Readings from Last 3 Encounters:  09/24/18 70.8 kg  09/17/18 71.6 kg  07/20/18 68 kg     Intake/Output Summary (Last 24 hours) at 09/25/2018 1220 Last data filed at 09/25/2018 0943 Gross per 24 hour  Intake 460 ml  Output 900 ml  Net -440 ml     Physical Exam  Awake Alert,  Oriented X 3, No new F.N deficits, Normal affect Symmetrical Chest wall movement, Good air movement bilaterally, basilar crackles RRR,No Gallops,Rubs or new Murmurs, No Parasternal Heave +ve B.Sounds, Abd Soft, No tenderness, No rebound - guarding or rigidity. No Cyanosis, Clubbing ,+2 edema, No new Rash .   Data Review:    CBC Recent Labs  Lab 09/24/18 1844 09/25/18 0316  WBC 5.0 4.8  HGB 10.7* 9.7*  HCT 35.5* 31.8*  PLT 279 276  MCV 89.0 89.1  MCH 26.8 27.2  MCHC 30.1 30.5  RDW 14.3 14.4  LYMPHSABS 0.8 0.9  MONOABS 0.4 0.5  EOSABS 0.1 0.1  BASOSABS 0.0 0.0    Chemistries  Recent Labs  Lab 09/24/18 1844 09/25/18 0316  NA 142 142  K 3.5 3.0*  CL 105 104  CO2 26 27  GLUCOSE 222* 169*  BUN 29* 28*  CREATININE 1.40* 1.33*  CALCIUM 9.4 9.1   ------------------------------------------------------------------------------------------------------------------ No results for input(s): CHOL, HDL, LDLCALC, TRIG, CHOLHDL, LDLDIRECT in the last 72 hours.  Lab Results  Component Value Date   HGBA1C 7.2 (A) 09/17/2018   ------------------------------------------------------------------------------------------------------------------ No results for input(s): TSH, T4TOTAL, T3FREE, THYROIDAB in the last 72 hours.  Invalid input(s): FREET3 ------------------------------------------------------------------------------------------------------------------ Recent Labs    09/25/18 0316  FERRITIN 62    Coagulation profile No results for input(s): INR, PROTIME in the last 168 hours.  Recent Labs    09/25/18 0316  DDIMER 1.27*    Cardiac Enzymes Recent Labs  Lab 09/24/18 1844  TROPONINI <0.03   ------------------------------------------------------------------------------------------------------------------    Component Value Date/Time   BNP 434.7 (H) 09/24/2018 1844    Inpatient Medications  Scheduled Meds: . allopurinol  300 mg Oral Daily  . amLODipine  5 mg  Oral QHS  . aspirin EC  81 mg Oral Daily  . atorvastatin  80 mg Oral q1800  . B-complex with vitamin C  1 tablet Oral Daily  . carvedilol  12.5 mg Oral BID WC  . DULoxetine  60 mg Oral Daily  . enoxaparin (LOVENOX) injection  40 mg Subcutaneous Q24H  . furosemide  40 mg Intravenous BID  . gabapentin  300 mg Oral BID  . insulin aspart  0-5 Units Subcutaneous QHS  . insulin aspart  0-9 Units Subcutaneous TID WC  . insulin glargine  40 Units Subcutaneous QHS  . pantoprazole  40 mg Oral Daily  . potassium chloride  40 mEq Oral Q4H  . sodium chloride flush  3 mL Intravenous Q12H  . [START ON 09/30/2018] Vitamin D (Ergocalciferol)  50,000 Units Oral Weekly   Continuous Infusions: . sodium chloride     PRN Meds:.sodium chloride, acetaminophen, ondansetron (ZOFRAN) IV, ondansetron, sodium chloride flush, tiZANidine  Micro Results Recent Results (from the past 240 hour(s))  SARS Coronavirus 2 (CEPHEID- Performed in [place] hospital lab), Hosp Order     Status: Abnormal   Collection Time: 09/24/18  6:40 PM   Specimen: Nasopharyngeal Swab  Result Value Ref Range Status   SARS Coronavirus 2 POSITIVE (A) NEGATIVE Final    Comment: RESULT CALLED TO, READ BACK BY AND VERIFIED WITH: Kathaleen Bury. RN @2115  ON 06.22.2020 BY COHEN,K (NOTE) If result is NEGATIVE SARS-CoV-2 target nucleic acids are NOT DETECTED. The SARS-CoV-2 RNA is generally detectable in upper and lower  respiratory specimens during the acute phase of infection. The lowest  concentration of SARS-CoV-2 viral copies this assay can detect is 250  copies / mL. A negative result does not preclude SARS-CoV-2 infection  and should not be used as the sole basis for treatment or other  patient management decisions.  A negative result may occur with  improper specimen collection / handling, submission of specimen other  than nasopharyngeal swab, presence of viral mutation(s) within the  areas targeted by this assay, and inadequate  number of viral copies  (<250 copies / mL). A negative result must be combined with clinical  observations, patient history, and epidemiological information. If result is POSITIVE SARS-CoV-2 target nucleic acids are DETE CTED. The SARS-CoV-2 RNA is generally detectable in upper and lower  respiratory specimens during the acute phase of infection.  Positive  results are indicative of active infection with SARS-CoV-2.  Clinical  correlation with patient history and other diagnostic information is  necessary to determine patient infection status.  Positive results do  not rule out bacterial infection or co-infection with other viruses. If result is PRESUMPTIVE POSTIVE SARS-CoV-2 nucleic acids MAY BE PRESENT.   A presumptive positive result was obtained on the submitted specimen  and confirmed on repeat testing.  While 2019 novel coronavirus  (SARS-CoV-2) nucleic acids may be present in the submitted sample  additional confirmatory testing may be necessary for epidemiological  and / or clinical management purposes  to differentiate between  SARS-CoV-2 and other Sarbecovirus currently known to infect humans.  If clinically indicated additional testing with an alternate test  methodology (LAB [phone]) is advised. The SARS-CoV-2 RNA is generally  detectable in upper and lower respiratory specimens during the acute  phase of infection. The expected result is Negative. Fact Sheet for Patients:  StrictlyIdeas.no Fact Sheet for Healthcare Providers: BankingDealers.co.za This test is not yet  approved or cleared by the [place] and has been authorized for detection and/or diagnosis of SARS-CoV-2 by FDA under an Emergency Use Authorization (EUA).  This EUA will remain in effect (meaning this test can be used) for the duration of the COVID-19 declaration under Section 564(b)(1) of the Act, 21 U.S.C. section 360bbb-3(b)(1), unless the  authorization is terminated or revoked sooner. Performed at [facility], [place] [address] [place], [place] [address]     Radiology Reports Dg Chest 2 View  Result Date: 09/21/2018 CLINICAL DATA:  Pt. States SOB x 3 wks. With weakness. HX of Diabetes, heart failure. Nonsmoker, HTN EXAM: CHEST - 2 VIEW COMPARISON:  07/21/2018 FINDINGS: Worsening airspace consolidation in the lung bases, left greater than right. Diffuse interstitial infiltrates or edema slightly increased. Small bilateral pleural effusions are now conspicuous. Heart size upper limits normal. No pneumothorax. Visualized bones unremarkable. Cholecystectomy clips. IMPRESSION: Worsening bilateral edema/infiltrates and small effusions. Electronically Signed   By: Lucrezia Europe M.D.   On: 09/21/2018 09:26   Dg Chest Portable 1 View  Result Date: 09/24/2018 CLINICAL DATA:  CHF, respiratory failure, shortness of breath EXAM: PORTABLE CHEST 1 VIEW COMPARISON:  09/20/2018 FINDINGS: Cardiomegaly noted with increased vascular congestion and perihilar/basilar interstitial edema pattern with pleural effusions. Pattern compatible with CHF. There is associated bibasilar partial collapse/consolidation. No pneumothorax. Trachea midline. Degenerative changes of the spine. IMPRESSION: Slight worsening CHF pattern compared to 09/20/2018. Electronically Signed   By: Jerilynn Mages.  Shick M.D.   On: 09/24/2018 19:22    Time Spent in minutes  35 minutes   Phillips Climes M.D on 09/25/2018 at 12:20 PM  Between 7am to 7pm - Pager - [phone]  After 7pm go to [email or web address] - password [facility]  [facility] -  Office  [phone]

## 2018-09-25 NOTE — Plan of Care (Signed)
  Problem: Education: Goal: Knowledge of risk factors and measures for prevention of condition will improve Outcome: Progressing   Problem: Coping: Goal: Psychosocial and spiritual needs will be supported Outcome: Progressing   Problem: Respiratory: Goal: Will maintain a patent airway Outcome: Progressing Goal: Complications related to the disease process, condition or treatment will be avoided or minimized Outcome: Progressing   

## 2018-09-25 NOTE — Progress Notes (Signed)
Attending cardiology review of echo order:  Due to the spread of COVID-19, departmental policy is to review orders for procedures and determine appropriateness regarding testing at this time. The following criteria are being used to limit potential exposure and spread of infection.  Is the patient being evaluated for COVID-19 infection: YES, PRIOR POSITIVE TEST  Is the patient actively having infectious respiratory symptoms or fever: cough/SOB Does the patient have a known history of prior cardiovascular disease: yes Would the test change management of the patient: not in the immediate short tem Can the test be performed at a later time: yes  Special circumstances: Is the patient undergoing evaluation for embolic CVA: no Is the patient planned for chemotherapy: no  Based on the review above, this study is not to be performed at this time.  Per chart review: patient with known chronic diastolic heart failure and prior exacerbations. Recent echo 03/2018 with EF 50-55% and grade 2 diastolic dysfunction. Troponin normal this admission. BNP elevated (though lower than most recent hospitalization w/COVID). Hemodynamically stable. Given the above, will not perform echocardiogram at this time. If her clinical situation changes, please contact the cardiology inpatient team and we will reassess at that time.  Buford Dresser, MD, PhD [facility]  [address], [place], [place] [address] [phone]

## 2018-09-25 NOTE — Progress Notes (Signed)
Family Update: Asked patient if there was anyone I could call to update and she said no, that she had updated her daughter already.

## 2018-09-26 ENCOUNTER — Inpatient Hospital Stay (HOSPITAL_COMMUNITY): Payer: Medicare Other

## 2018-09-26 DIAGNOSIS — E160 Drug-induced hypoglycemia without coma: Secondary | ICD-10-CM

## 2018-09-26 DIAGNOSIS — T383X5A Adverse effect of insulin and oral hypoglycemic [antidiabetic] drugs, initial encounter: Secondary | ICD-10-CM

## 2018-09-26 DIAGNOSIS — U071 COVID-19: Secondary | ICD-10-CM

## 2018-09-26 LAB — BASIC METABOLIC PANEL
Anion gap: 10 (ref 5–15)
BUN: 34 mg/dL — ABNORMAL HIGH (ref 8–23)
CO2: 28 mmol/L (ref 22–32)
Calcium: 9.2 mg/dL (ref 8.9–10.3)
Chloride: 101 mmol/L (ref 98–111)
Creatinine, Ser: 1.3 mg/dL — ABNORMAL HIGH (ref 0.44–1.00)
GFR calc Af Amer: 51 mL/min — ABNORMAL LOW (ref >60)
GFR calc non Af Amer: 44 mL/min — ABNORMAL LOW (ref >60)
Glucose, Bld: 79 mg/dL (ref 70–99)
Potassium: 3.9 mmol/L (ref 3.5–5.1)
Sodium: 139 mmol/L (ref 135–145)

## 2018-09-26 LAB — GLUCOSE, CAPILLARY
Glucose-Capillary: 208 mg/dL — ABNORMAL HIGH (ref 70–99)
Glucose-Capillary: 50 mg/dL — ABNORMAL LOW (ref 70–99)
Glucose-Capillary: 99 mg/dL (ref 70–99)
Glucose-Capillary: 99 mg/dL (ref 70–99)

## 2018-09-26 LAB — PROCALCITONIN: Procalcitonin: <0.1 ng/mL

## 2018-09-26 LAB — D-DIMER, QUANTITATIVE: D-Dimer, Quant: 1.15 ug/mL-FEU — ABNORMAL HIGH (ref 0.00–0.50)

## 2018-09-26 LAB — FERRITIN: Ferritin: 59 ng/mL (ref 11–307)

## 2018-09-26 LAB — LACTATE DEHYDROGENASE: LDH: 145 U/L (ref 98–192)

## 2018-09-26 LAB — C-REACTIVE PROTEIN: CRP: <0.8 mg/dL (ref <1.0)

## 2018-09-26 MED ORDER — DEXTROSE 50 % IV SOLN
25.0000 mL | Freq: Once | INTRAVENOUS | Status: AC
  Administered 2018-09-26: 25 mL via INTRAVENOUS

## 2018-09-26 MED ORDER — INSULIN GLARGINE 100 UNIT/ML ~~LOC~~ SOLN
35.0000 [IU] | Freq: Every day | SUBCUTANEOUS | Status: DC
  Administered 2018-09-26: 22:00:00 35 [IU] via SUBCUTANEOUS
  Filled 2018-09-26: qty 0.35

## 2018-09-26 MED ORDER — DEXTROSE 50 % IV SOLN
INTRAVENOUS | Status: AC
  Filled 2018-09-26: qty 50

## 2018-09-26 NOTE — Progress Notes (Signed)
Inpatient Diabetes Program Recommendations  AACE/ADA: New Consensus Statement on Inpatient Glycemic Control (2015)  Target Ranges:  Prepandial:   less than 140 mg/dL      Peak postprandial:   less than 180 mg/dL (1-2 hours)      Critically ill patients:  140 - 180 mg/dL   Lab Results  Component Value Date   GLUCAP 99 09/26/2018   HGBA1C 7.2 (A) 09/17/2018    Review of Glycemic Control Results for Kara Hanson, Kara Hanson (MRN [id number]) as of 09/26/2018 11:01  Ref. Range 09/25/2018 07:55 09/25/2018 12:28 09/25/2018 21:08 09/26/2018 08:00 09/26/2018 08:24  Glucose-Capillary Latest Ref Range: 70 - 99 mg/dL 98 197 (H) 208 (H) 50 (L) 99   Diabetes history: DM 2 Outpatient Diabetes medications:  Basaglar 40 units q HS, Novolog 0-12 units tid Current orders for Inpatient glycemic control:  Novolog sensitive tid with meals and HS Lantus 40 units q HS  Inpatient Diabetes Program Recommendations:    Note low fasting CBG- Please consider reducing Lantus to 35 units q HS.    Thanks,  Adah Perl, RN, BC-ADM Inpatient Diabetes Coordinator Pager [phone] (8a-5p)

## 2018-09-26 NOTE — Progress Notes (Signed)
PROGRESS NOTE  Kara Hanson QXI:[id number] DOB: 1955/05/15 DOA: 09/24/2018  PCP: [name], MD  Brief History/Interval Summary: 63 y.o.femalewith medical history significant ofCongestive heart failure, recent COVID-19 positivediagnosed on April 9th, discharged from [facility] on the 22nd, he was discharged on 3 L nasal cannula, reports worsening dyspnea over the last week, lower extremity edema, no improvement with increasing her Lasix dose by PCP, presents to ED with dyspnea, work-up significant for CHF, transferred to [facility] further care, given her COVID-19 test remains positive.   Reason for Visit: Acute on chronic diastolic CHF  Consultants: None  Procedures: None  Antibiotics: None  Subjective/Interval History: Patient states that she does feel better from over shortness of breath standpoint.  Feels fatigued this morning.  Denies any nausea vomiting.  Her glucose level was noted to be low this morning.   Assessment/Plan:  Acute on chronic diastolic CHF Patient with known history of diastolic CHF.  Most recent echocardiogram in December showed normal systolic function but showed grade 2 diastolic dysfunction.  Patient had significant edema at the time of admission.  She had elevated BNP levels.  Patient was started on intravenous Lasix.  Her weight is noted to be stable.  Chest x-ray done a few days ago which continued to show persistent fluid overload.  Chest x-ray to be repeated today.  We will continue with Lasix for now with plan to change dose depending on x-ray findings.  Peripheral edema appears to have improved.  Chronic hypoxic respiratory failure Patient is on 3 L of oxygen at home at baseline.  She is requiring same amount of oxygen here in the hospital.  COVID-19 positive She was noted to have a positive test results.  However she does not appear to be manifesting acute illness as a result of the same.  Her inflammatory markers were normal.  Continue to  monitor for now.  No indication for COVID-19 specific treatments at this time.  Diabetes mellitus type 2 with episodes of hypoglycemia Blood glucose level noted to be low this morning.  Possibly due to poor oral intake.  Will decrease the dose of her Lantus.  Continue to monitor for now.  HbA1c is pending.  Essential hypertension Losartan on hold due to high creatinine.  Monitor blood pressures closely.  Acute kidney injury on chronic kidney disease stage III Creatinine appears to be stable.  Continue to monitor for now.  Monitor urine output.    DVT Prophylaxis: Lovenox Code Status: Full code Family Communication: Discussed with the patient Disposition Plan: Mobilize.  Hopefully home when ready for discharge.   Medications:  Scheduled: . allopurinol  300 mg Oral Daily  . amLODipine  5 mg Oral QHS  . aspirin EC  81 mg Oral Daily  . atorvastatin  80 mg Oral q1800  . B-complex with vitamin C  1 tablet Oral Daily  . carvedilol  12.5 mg Oral BID WC  . dextrose      . DULoxetine  60 mg Oral Daily  . enoxaparin (LOVENOX) injection  40 mg Subcutaneous Q24H  . furosemide  40 mg Intravenous BID  . gabapentin  300 mg Oral BID  . insulin aspart  0-5 Units Subcutaneous QHS  . insulin aspart  0-9 Units Subcutaneous TID WC  . insulin glargine  35 Units Subcutaneous QHS  . pantoprazole  40 mg Oral Daily  . sodium chloride flush  3 mL Intravenous Q12H  . [START ON 09/30/2018] Vitamin D (Ergocalciferol)  50,000 Units Oral Weekly  Continuous: . sodium chloride     LZJ:QBHALP chloride, acetaminophen, ondansetron (ZOFRAN) IV, ondansetron, sodium chloride flush, tiZANidine   Objective:  Vital Signs  Vitals:   09/26/18 0015 09/26/18 0410 09/26/18 0500 09/26/18 0804  BP:  (!) 142/79  (!) 167/78  Pulse: 66 66  64  Resp: 16 15  18   Temp: 98.1 F (36.7 C) 97.6 F (36.4 C)  98 F (36.7 C)  TempSrc: Oral Oral  Oral  SpO2: 100% 99%  98%  Weight:   71.7 kg   Height:         Intake/Output Summary (Last 24 hours) at 09/26/2018 1111 Last data filed at 09/26/2018 1006 Gross per 24 hour  Intake 1080 ml  Output 1550 ml  Net -470 ml   Filed Weights   09/24/18 1748 09/26/18 0500  Weight: 70.8 kg 71.7 kg    General appearance: Awake alert.  In no distress Resp: Crackles bilateral bases.  No wheezing or rhonchi.  Mildly tachypneic at rest. Cardio: S1-S2 is normal regular.  No S3-S4.  No rubs murmurs or bruit GI: Abdomen is soft.  Nontender nondistended.  Bowel sounds are present normal.  No masses organomegaly Extremities: Minimal edema bilateral lower extremities. Neurologic: Alert and oriented x3.  No focal neurological deficits.    Lab Results:  Data Reviewed: I have personally reviewed following labs and imaging studies  CBC: Recent Labs  Lab 09/24/18 1844 09/25/18 0316  WBC 5.0 4.8  NEUTROABS 3.7 3.4  HGB 10.7* 9.7*  HCT 35.5* 31.8*  MCV 89.0 89.1  PLT 279 379    Basic Metabolic Panel: Recent Labs  Lab 09/24/18 1844 09/25/18 0316 09/26/18 0250  NA 142 142 139  K 3.5 3.0* 3.9  CL 105 104 101  CO2 26 27 28   GLUCOSE 222* 169* 79  BUN 29* 28* 34*  CREATININE 1.40* 1.33* 1.30*  CALCIUM 9.4 9.1 9.2    GFR: Estimated Creatinine Clearance: 38.2 mL/min (A) (by C-G formula based on SCr of 1.3 mg/dL (H)).  Cardiac Enzymes: Recent Labs  Lab 09/24/18 1844  TROPONINI <0.03    CBG: Recent Labs  Lab 09/25/18 0755 09/25/18 1228 09/25/18 2108 09/26/18 0800 09/26/18 0824  GLUCAP 98 197* 208* 50* 99    Anemia Panel: Recent Labs    09/25/18 0316 09/26/18 0250  FERRITIN 62 59    Recent Results (from the past 240 hour(s))  SARS Coronavirus 2 (CEPHEID- Performed in [place] hospital lab), Hosp Order     Status: Abnormal   Collection Time: 09/24/18  6:40 PM   Specimen: Nasopharyngeal Swab  Result Value Ref Range Status   SARS Coronavirus 2 POSITIVE (A) NEGATIVE Final    Comment: RESULT CALLED TO, READ BACK BY AND VERIFIED  WITH: Kathaleen Bury. RN @2115  ON 06.22.2020 BY COHEN,K (NOTE) If result is NEGATIVE SARS-CoV-2 target nucleic acids are NOT DETECTED. The SARS-CoV-2 RNA is generally detectable in upper and lower  respiratory specimens during the acute phase of infection. The lowest  concentration of SARS-CoV-2 viral copies this assay can detect is 250  copies / mL. A negative result does not preclude SARS-CoV-2 infection  and should not be used as the sole basis for treatment or other  patient management decisions.  A negative result may occur with  improper specimen collection / handling, submission of specimen other  than nasopharyngeal swab, presence of viral mutation(s) within the  areas targeted by this assay, and inadequate number of viral copies  (<250 copies / mL). A negative  result must be combined with clinical  observations, patient history, and epidemiological information. If result is POSITIVE SARS-CoV-2 target nucleic acids are DETE CTED. The SARS-CoV-2 RNA is generally detectable in upper and lower  respiratory specimens during the acute phase of infection.  Positive  results are indicative of active infection with SARS-CoV-2.  Clinical  correlation with patient history and other diagnostic information is  necessary to determine patient infection status.  Positive results do  not rule out bacterial infection or co-infection with other viruses. If result is PRESUMPTIVE POSTIVE SARS-CoV-2 nucleic acids MAY BE PRESENT.   A presumptive positive result was obtained on the submitted specimen  and confirmed on repeat testing.  While 2019 novel coronavirus  (SARS-CoV-2) nucleic acids may be present in the submitted sample  additional confirmatory testing may be necessary for epidemiological  and / or clinical management purposes  to differentiate between  SARS-CoV-2 and other Sarbecovirus currently known to infect humans.  If clinically indicated additional testing with an alternate test   methodology (LAB [phone]) is advised. The SARS-CoV-2 RNA is generally  detectable in upper and lower respiratory specimens during the acute  phase of infection. The expected result is Negative. Fact Sheet for Patients:  StrictlyIdeas.no Fact Sheet for Healthcare Providers: BankingDealers.co.za This test is not yet approved or cleared by the [place] FDA and has been authorized for detection and/or diagnosis of SARS-CoV-2 by FDA under an Emergency Use Authorization (EUA).  This EUA will remain in effect (meaning this test can be used) for the duration of the COVID-19 declaration under Section 564(b)(1) of the Act, 21 U.S.C. section 360bbb-3(b)(1), unless the authorization is terminated or revoked sooner. Performed at [facility], [place] [address] [place], [place] [address]       Radiology Studies: Dg Chest Portable 1 View  Result Date: 09/24/2018 CLINICAL DATA:  CHF, respiratory failure, shortness of breath EXAM: PORTABLE CHEST 1 VIEW COMPARISON:  09/20/2018 FINDINGS: Cardiomegaly noted with increased vascular congestion and perihilar/basilar interstitial edema pattern with pleural effusions. Pattern compatible with CHF. There is associated bibasilar partial collapse/consolidation. No pneumothorax. Trachea midline. Degenerative changes of the spine. IMPRESSION: Slight worsening CHF pattern compared to 09/20/2018. Electronically Signed   By: Jerilynn Mages.  Shick M.D.   On: 09/24/2018 19:22       LOS: 2 days   [facility] Pager on [email or web address]  09/26/2018, 11:11 AM

## 2018-09-27 LAB — BASIC METABOLIC PANEL
Anion gap: 8 (ref 5–15)
BUN: 39 mg/dL — ABNORMAL HIGH (ref 8–23)
CO2: 31 mmol/L (ref 22–32)
Calcium: 9.4 mg/dL (ref 8.9–10.3)
Chloride: 101 mmol/L (ref 98–111)
Creatinine, Ser: 1.44 mg/dL — ABNORMAL HIGH (ref 0.44–1.00)
GFR calc Af Amer: 45 mL/min — ABNORMAL LOW (ref >60)
GFR calc non Af Amer: 39 mL/min — ABNORMAL LOW (ref >60)
Glucose, Bld: 67 mg/dL — ABNORMAL LOW (ref 70–99)
Potassium: 4.1 mmol/L (ref 3.5–5.1)
Sodium: 140 mmol/L (ref 135–145)

## 2018-09-27 LAB — GLUCOSE, CAPILLARY
Glucose-Capillary: 106 mg/dL — ABNORMAL HIGH (ref 70–99)
Glucose-Capillary: 146 mg/dL — ABNORMAL HIGH (ref 70–99)
Glucose-Capillary: 130 mg/dL — ABNORMAL HIGH (ref 70–99)
Glucose-Capillary: 44 mg/dL — CL (ref 70–99)
Glucose-Capillary: 71 mg/dL (ref 70–99)
Glucose-Capillary: 91 mg/dL (ref 70–99)

## 2018-09-27 MED ORDER — INSULIN GLARGINE 100 UNIT/ML ~~LOC~~ SOLN: 20.0000 [IU] | Freq: Every day | SUBCUTANEOUS | Status: DC

## 2018-09-27 MED ORDER — INSULIN GLARGINE 100 UNIT/ML ~~LOC~~ SOLN
20.0000 [IU] | Freq: Every day | SUBCUTANEOUS | Status: DC
  Administered 2018-09-28: 20 [IU] via SUBCUTANEOUS
  Filled 2018-09-27: qty 0.2

## 2018-09-27 NOTE — Evaluation (Signed)
Physical Therapy Evaluation Patient Details Name: Kara Hanson MRN: [id number] DOB: 12-29-1955 Today's Date: 09/27/2018   History of Present Illness  Pt is a 63 y.o. female with recemt admission to [facility] with COVID-19 from 07/12/18-07/25/18, who presented to ED on 09/24/18 with worsening dyspnea and BLE edema; transferred to [facility] given COVID-19 test remains positive. Pt worked up for CHF exacerbation. PMH includes HTN, CKD III, DM2, CHF.    Clinical Impression  Pt presents with an overall decrease in functional mobility secondary to above. PTA, pt ambulatory with RW, lives with family who assists as needed, wears 2L O2 [place] since previous admission 07/2018. Today, pt ambulatory with RW and intermittent min guard; SpO2 down to 86% on 2L O2 [place] with ambulation. Pt would benefit from continued acute PT services to maximize functional mobility and independence prior to d/c home.     Follow Up Recommendations No PT follow up;Supervision for mobility/OOB(declined HHPT)    Equipment Recommendations  None recommended by PT    Recommendations for Other Services       Precautions / Restrictions Precautions Precautions: Fall Restrictions Weight Bearing Restrictions: No      Mobility  Bed Mobility Overal bed mobility: Independent                Transfers Overall transfer level: Needs assistance Equipment used: Rolling walker (2 wheeled) Transfers: Sit to/from Stand Sit to Stand: Min guard         General transfer comment: Able to stand from bed and BSC with RW and min guard  Ambulation/Gait Ambulation/Gait assistance: Min guard Gait Distance (Feet): 80 Feet Assistive device: Rolling walker (2 wheeled) Gait Pattern/deviations: Step-through pattern;Decreased stride length Gait velocity: Decreased   General Gait Details: Slow, mildy unsteady gait with RW and intermittent min guard for balance. SpO2 down to 86% on 2L O2 , returning to 95% with seated rest  Stairs             Wheelchair Mobility    Modified Rankin (Stroke Patients Only)       Balance Overall balance assessment: Needs assistance   Sitting balance-Leahy Scale: Good Sitting balance - Comments: Able to reach bilateral feet sitting EOB     Standing balance-Leahy Scale: Fair Standing balance comment: Can static stand without UE support                             Pertinent Vitals/Pain Pain Assessment: No/denies pain    Home Living Family/patient expects to be discharged to:: Private residence Living Arrangements: Spouse/significant other Available Help at Discharge: Family;Available PRN/intermittently Type of Home: House Home Access: Stairs to enter Entrance Stairs-Rails: Right Entrance Stairs-Number of Steps: 4 Home Layout: One level Home Equipment: Walker - 2 wheels;Wheelchair - manual      Prior Function Level of Independence: Needs assistance   Gait / Transfers Assistance Needed: Ambulation with RW; reports she has been moving well since admission 07/2018 and worked with HHPT, has not needed w/c  ADL's / Homemaking Assistance Needed: Pt reports family assist with bathing as needed        Hand Dominance        Extremity/Trunk Assessment   Upper Extremity Assessment Upper Extremity Assessment: Generalized weakness(Tremulous BUEs (pt reports this is new since getting sick again))    Lower Extremity Assessment Lower Extremity Assessment: Generalized weakness       Communication   Communication: No difficulties  Cognition Arousal/Alertness: Awake/alert Behavior During Therapy:  WFL for tasks assessed/performed Overall Cognitive Status: Within Functional Limits for tasks assessed                                        General Comments      Exercises     Assessment/Plan    PT Assessment Patient needs continued PT services  PT Problem List Decreased strength;Decreased activity tolerance;Decreased balance;Decreased  mobility       PT Treatment Interventions DME instruction;Gait training;Stair training;Functional mobility training;Therapeutic activities;Balance training;Therapeutic exercise;Patient/family education    PT Goals (Current goals can be found in the Care Plan section)  Acute Rehab PT Goals Patient Stated Goal: Return home PT Goal Formulation: With patient Time For Goal Achievement: 10/11/18 Potential to Achieve Goals: Good    Frequency Min 3X/week   Barriers to discharge        Co-evaluation               AM-PAC PT "6 Clicks" Mobility  Outcome Measure Help needed turning from your back to your side while in a flat bed without using bedrails?: None Help needed moving from lying on your back to sitting on the side of a flat bed without using bedrails?: None Help needed moving to and from a bed to a chair (including a wheelchair)?: A Little Help needed standing up from a chair using your arms (e.g., wheelchair or bedside chair)?: A Little Help needed to walk in hospital room?: A Little Help needed climbing 3-5 steps with a railing? : A Little 6 Click Score: 20    End of Session Equipment Utilized During Treatment: Oxygen Activity Tolerance: Patient tolerated treatment well Patient left: in bed;with call bell/phone within reach;with bed alarm set Nurse Communication: Mobility status PT Visit Diagnosis: Other abnormalities of gait and mobility (R26.89);Muscle weakness (generalized) (M62.81)    Time: 1610-9604 PT Time Calculation (min) (ACUTE ONLY): 19 min   Charges:   PT Evaluation $PT Eval Moderate Complexity: [place], PT, DPT Acute Rehabilitation Services  Pager [phone] Office [place] 09/27/2018, 5:27 PM

## 2018-09-27 NOTE — Progress Notes (Addendum)
PROGRESS NOTE  Kara Hanson HGD:[id number] DOB: July 26, 1955 DOA: 09/24/2018  PCP: [name], MD  Brief History/Interval Summary: 63 y.o.femalewith medical history significant ofCongestive heart failure, recent COVID-19 positivediagnosed on April 9th, discharged from [facility] on the 22nd, he was discharged on 3 L nasal cannula, reports worsening dyspnea over the last week, lower extremity edema, no improvement with increasing her Lasix dose by PCP, presents to ED with dyspnea, work-up significant for CHF, transferred to [facility] further care, given her COVID-19 test remains positive.   Reason for Visit: Acute on chronic diastolic CHF  Consultants: None  Procedures: None  Antibiotics: None  Subjective/Interval History: Patient noted to be slightly lethargic this morning but easily arousable.  Her blood glucose level was low this morning.  She complains of feeling fatigued.  Denies any chest pain.  Shortness of breath is slightly better.  Dry cough.     Assessment/Plan:  Acute on chronic diastolic CHF Patient with known history of diastolic CHF.  Most recent echocardiogram in December showed normal systolic function but showed grade 2 diastolic dysfunction.  Patient had significant edema at the time of admission.  She had elevated BNP levels.  Patient was started on intravenous Lasix.  Appears to be diuresing.  Weight still remains the same.  Continue intravenous Lasix for now.  Chest x-ray done yesterday showed persistent findings of CHF.  PT and OT evaluation.  Chronic hypoxic respiratory failure Patient is on 3 L of oxygen at home at baseline.  Oxygenation is stable.  COVID-19 positive She was noted to have a positive test results.  However she does not appear to be manifesting acute illness as a result of the same.  Her inflammatory markers were normal.  Continue to monitor for now.  No indication for COVID-19 specific treatments at this time.  Diabetes mellitus type 2  with episodes of hypoglycemia Continues to have low glucose levels.  This is most likely due to poor oral intake.  We will further decrease the dose of her Lantus.  We will discontinue nighttime coverage.  Patient encouraged to eat all her meals.  Late night snack will be ordered.  Continue SSI.  HbA1c was 7.2 earlier this month.    Essential hypertension Pressure is mildly elevated at times.  Losartan was placed on hold due to high creatinine.  Hydralazine as needed.  Acute kidney injury on chronic kidney disease stage III Renal function is stable for the most part.  Monitor urine output.  Normocytic anemia Likely due to chronic disease.  No evidence of overt bleeding.  Continue to monitor.   DVT Prophylaxis: Lovenox Code Status: Full code Family Communication: Discussed with the patient.  We will try to call her daughter today. Disposition Plan: Mobilize.  Hopefully home when ready for discharge.   Medications:  Scheduled:  allopurinol  300 mg Oral Daily   amLODipine  5 mg Oral QHS   aspirin EC  81 mg Oral Daily   atorvastatin  80 mg Oral q1800   B-complex with vitamin C  1 tablet Oral Daily   carvedilol  12.5 mg Oral BID WC   DULoxetine  60 mg Oral Daily   enoxaparin (LOVENOX) injection  40 mg Subcutaneous Q24H   furosemide  40 mg Intravenous BID   gabapentin  300 mg Oral BID   insulin aspart  0-9 Units Subcutaneous TID WC   insulin glargine  20 Units Subcutaneous QHS   pantoprazole  40 mg Oral Daily   sodium chloride flush  3 mL Intravenous Q12H   [START ON 09/30/2018] Vitamin D (Ergocalciferol)  50,000 Units Oral Weekly   Continuous:  sodium chloride     ION:GEXBMW chloride, acetaminophen, ondansetron (ZOFRAN) IV, ondansetron, sodium chloride flush, tiZANidine   Objective:  Vital Signs  Vitals:   09/27/18 0000 09/27/18 0400 09/27/18 0440 09/27/18 0800  BP: (!) 154/76 136/76  (!) 151/70  Pulse: 65 63  70  Resp: 17 14  16   Temp: 98.2 F (36.8 C)  98.4 F (36.9 C)  97.9 F (36.6 C)  TempSrc: Oral Oral    SpO2: 97% 99%  100%  Weight:   72 kg   Height:        Intake/Output Summary (Last 24 hours) at 09/27/2018 1104 Last data filed at 09/27/2018 1043 Gross per 24 hour  Intake 1200 ml  Output 2600 ml  Net -1400 ml   Filed Weights   09/24/18 1748 09/26/18 0500 09/27/18 0440  Weight: 70.8 kg 71.7 kg 72 kg   General appearance: Slightly lethargic.  Easily arousable.  In no distress. Resp: Mildly tachypneic at rest.  No use of accessory muscles.  Crackles bilaterally at the bases predominantly.  No wheezing or rhonchi. Cardio: S1-S2 is normal regular.  No S3-S4.  No rubs murmurs or bruit GI: Abdomen is soft.  Nontender nondistended.  Bowel sounds are present normal.  No masses organomegaly Extremities: No edema.  Full range of motion of lower extremities. Neurologic: Alert and oriented x3.  No focal neurological deficits.    Lab Results:  Data Reviewed: I have personally reviewed following labs and imaging studies  CBC: Recent Labs  Lab 09/24/18 1844 09/25/18 0316  WBC 5.0 4.8  NEUTROABS 3.7 3.4  HGB 10.7* 9.7*  HCT 35.5* 31.8*  MCV 89.0 89.1  PLT 279 413    Basic Metabolic Panel: Recent Labs  Lab 09/24/18 1844 09/25/18 0316 09/26/18 0250 09/27/18 0300  NA 142 142 139 140  K 3.5 3.0* 3.9 4.1  CL 105 104 101 101  CO2 26 27 28 31   GLUCOSE 222* 169* 79 67*  BUN 29* 28* 34* 39*  CREATININE 1.40* 1.33* 1.30* 1.44*  CALCIUM 9.4 9.1 9.2 9.4    GFR: Estimated Creatinine Clearance: 34.5 mL/min (A) (by C-G formula based on SCr of 1.44 mg/dL (H)).  Cardiac Enzymes: Recent Labs  Lab 09/24/18 1844  TROPONINI <0.03    CBG: Recent Labs  Lab 09/26/18 1737 09/26/18 1934 09/26/18 2130 09/27/18 0808 09/27/18 0830  GLUCAP 106* 146* 130* 44* 71    Anemia Panel: Recent Labs    09/25/18 0316 09/26/18 0250  FERRITIN 62 59    Recent Results (from the past 240 hour(s))  SARS Coronavirus 2 (CEPHEID-  Performed in [place] hospital lab), Hosp Order     Status: Abnormal   Collection Time: 09/24/18  6:40 PM   Specimen: Nasopharyngeal Swab  Result Value Ref Range Status   SARS Coronavirus 2 POSITIVE (A) NEGATIVE Final    Comment: RESULT CALLED TO, READ BACK BY AND VERIFIED WITH: Kathaleen Bury. RN @2115  ON 06.22.2020 BY COHEN,K (NOTE) If result is NEGATIVE SARS-CoV-2 target nucleic acids are NOT DETECTED. The SARS-CoV-2 RNA is generally detectable in upper and lower  respiratory specimens during the acute phase of infection. The lowest  concentration of SARS-CoV-2 viral copies this assay can detect is 250  copies / mL. A negative result does not preclude SARS-CoV-2 infection  and should not be used as the sole basis for treatment or other  patient management decisions.  A negative result may occur with  improper specimen collection / handling, submission of specimen other  than nasopharyngeal swab, presence of viral mutation(s) within the  areas targeted by this assay, and inadequate number of viral copies  (<250 copies / mL). A negative result must be combined with clinical  observations, patient history, and epidemiological information. If result is POSITIVE SARS-CoV-2 target nucleic acids are DETE CTED. The SARS-CoV-2 RNA is generally detectable in upper and lower  respiratory specimens during the acute phase of infection.  Positive  results are indicative of active infection with SARS-CoV-2.  Clinical  correlation with patient history and other diagnostic information is  necessary to determine patient infection status.  Positive results do  not rule out bacterial infection or co-infection with other viruses. If result is PRESUMPTIVE POSTIVE SARS-CoV-2 nucleic acids MAY BE PRESENT.   A presumptive positive result was obtained on the submitted specimen  and confirmed on repeat testing.  While 2019 novel coronavirus  (SARS-CoV-2) nucleic acids may be present in the submitted sample    additional confirmatory testing may be necessary for epidemiological  and / or clinical management purposes  to differentiate between  SARS-CoV-2 and other Sarbecovirus currently known to infect humans.  If clinically indicated additional testing with an alternate test  methodology (LAB [phone]) is advised. The SARS-CoV-2 RNA is generally  detectable in upper and lower respiratory specimens during the acute  phase of infection. The expected result is Negative. Fact Sheet for Patients:  StrictlyIdeas.no Fact Sheet for Healthcare Providers: BankingDealers.co.za This test is not yet approved or cleared by the [place] FDA and has been authorized for detection and/or diagnosis of SARS-CoV-2 by FDA under an Emergency Use Authorization (EUA).  This EUA will remain in effect (meaning this test can be used) for the duration of the COVID-19 declaration under Section 564(b)(1) of the Act, 21 U.S.C. section 360bbb-3(b)(1), unless the authorization is terminated or revoked sooner. Performed at [facility], [facility] [address], [place], [place] [address]       Radiology Studies: Dg Chest Port 1 View  Result Date: 09/26/2018 CLINICAL DATA:  Acute CHF. EXAM: PORTABLE CHEST 1 VIEW COMPARISON:  09/24/2018 FINDINGS: There continues to be evidence for pulmonary edema and enlarged central vascular structures. Bilateral pleural effusions. Slightly improved aeration in the lower lungs. Heart size is mildly enlarged. Persistent air bronchograms at the left lung base. IMPRESSION: Slightly improved aeration in the lower lungs with persistent pulmonary edema and bilateral pleural effusions. Findings remain compatible with CHF. Electronically Signed   By: Markus Daft M.D.   On: 09/26/2018 14:10       LOS: 3 days   [place] [facility] Pager on [email or web address]  09/27/2018, 11:04 AM

## 2018-09-28 LAB — BASIC METABOLIC PANEL
Anion gap: 8 (ref 5–15)
BUN: 46 mg/dL — ABNORMAL HIGH (ref 8–23)
CO2: 32 mmol/L (ref 22–32)
Calcium: 9.2 mg/dL (ref 8.9–10.3)
Chloride: 97 mmol/L — ABNORMAL LOW (ref 98–111)
Creatinine, Ser: 1.31 mg/dL — ABNORMAL HIGH (ref 0.44–1.00)
GFR calc Af Amer: 50 mL/min — ABNORMAL LOW (ref >60)
GFR calc non Af Amer: 43 mL/min — ABNORMAL LOW (ref >60)
Glucose, Bld: 133 mg/dL — ABNORMAL HIGH (ref 70–99)
Potassium: 4.5 mmol/L (ref 3.5–5.1)
Sodium: 137 mmol/L (ref 135–145)

## 2018-09-28 LAB — GLUCOSE, CAPILLARY
Glucose-Capillary: 186 mg/dL — ABNORMAL HIGH (ref 70–99)
Glucose-Capillary: 129 mg/dL — ABNORMAL HIGH (ref 70–99)
Glucose-Capillary: 73 mg/dL (ref 70–99)
Glucose-Capillary: 87 mg/dL (ref 70–99)

## 2018-09-28 MED ORDER — FUROSEMIDE 10 MG/ML IJ SOLN
40.0000 mg | Freq: Three times a day (TID) | INTRAMUSCULAR | Status: DC
  Administered 2018-09-28 – 2018-10-01 (×10): 40 mg via INTRAVENOUS
  Filled 2018-09-28 (×9): qty 4

## 2018-09-28 NOTE — TOC Initial Note (Signed)
Transition of Care [facility]) - Initial/Assessment Note    Patient Details  Name: Kara Hanson MRN: [id number] Date of Birth: 04-14-1955  Transition [facility]) CM/SW Contact:    Midge Minium RN, BSN, NCM-BC, ACM-RN [phone] (working remotely) Phone Number: 09/28/2018, 2:09 PM  Clinical Narrative:                 CM following for dispositional needs. Patient is a readmit from 4/22; transitioned home with home O2 and HH. Patient lives at home with her family and has DME to assist during ambulation. CM attempted to contact the patient to discuss the POC with no answer. PT/OT eval completed with HH recommended; patient discharged from [facility] on 5/20; per PT eval the patient is declining HH post discharge. CM team will continue to follow for progression needs.   Expected Discharge Plan: Home/Self Care Barriers to Discharge: Continued Medical Work up   Patient Goals and CMS Choice     Choice offered to / list presented to : NA  Expected Discharge Plan and Services Expected Discharge Plan: Home/Self Care In-house Referral: NA Discharge Planning Services: CM Consult Post Acute Care Choice: NA Living arrangements for the past 2 months: Single Family Home                  Prior Living Arrangements/Services Living arrangements for the past 2 months: Single Family Home Lives with:: Self, Adult Children              Current home services: DME    Activities of Daily Living Home Assistive Devices/Equipment: None ADL Screening (condition at time of admission) Patient's cognitive ability adequate to safely complete daily activities?: Yes Is the patient deaf or have difficulty hearing?: No Does the patient have difficulty seeing, even when wearing glasses/contacts?: No Does the patient have difficulty concentrating, remembering, or making decisions?: No Patient able to express need for assistance with ADLs?: Yes Does the patient have difficulty dressing or bathing?:  No Independently performs ADLs?: Yes (appropriate for developmental age) Does the patient have difficulty walking or climbing stairs?: No Weakness of Legs: Both Weakness of Arms/Hands: None   Admission diagnosis:  Shortness of breath [R06.02] Acute on chronic congestive heart failure, unspecified heart failure type ([place]) [I50.9] COVID-19 virus infection [U07.1] Patient Active Problem List   Diagnosis Date Noted  . Acute CHF (congestive heart failure) ([place]) 09/24/2018  . SIRS (systemic inflammatory response syndrome) ([place]) 07/25/2018  . COVID-19 virus infection 07/20/2018  . Chronic diastolic CHF (congestive heart failure) ([place]) 07/20/2018  . Pneumonia due to COVID-19 virus 07/20/2018  . Vitamin D deficiency 04/02/2018  . Spinal stenosis 03/29/2018  . Acute and chronic respiratory failure with hypoxia ([place]) 03/20/2018  . Diabetes mellitus type 2 with complications, uncontrolled ([place]) 03/20/2018  . CKD (chronic kidney disease), stage III ([place]) 03/20/2018  . Acute on chronic diastolic CHF (congestive heart failure) ([place]) 11/27/2017  . Acute kidney injury ([place]) 11/27/2017  . Acute respiratory distress 11/27/2017  . Bulging lumbar disc 11/14/2017  . Gout 11/14/2017  . HLD (hyperlipidemia) 09/30/2016  . Chest pain 06/17/2016  . Stress-induced cardiomyopathy 05/19/2016  . Type 2 diabetes mellitus with diabetic neuropathy, unspecified ([place]) 05/06/2016  . Vertigo 05/06/2016  . Controlled type 2 diabetes mellitus with hyperglycemia ([place]) 04/23/2016  . Hypertension 04/23/2016  . Normochromic normocytic anemia 04/23/2016  . Syncope 04/23/2016   PCP:  [name], MD Pharmacy:   [place], [address]  [place] [address] Phone: [phone] Fax: [phone]     Social Determinants of Health (SDOH) Interventions    Readmission Risk Interventions Readmission Risk Prevention Plan 09/28/2018  Transportation  Screening Complete  PCP or Specialist Appt within 3-5 Days Not Complete  Not Complete comments Continued medical workup; will assess prior to discharge  [place] or [place] Complete  Social Work Consult for [place] Planning/Counseling Complete  Palliative Care Screening Not Applicable  Medication Review Press photographer) Complete  Some recent data might be hidden

## 2018-09-28 NOTE — Progress Notes (Signed)
PROGRESS NOTE  Kara Hanson PHX:[id number] DOB: 1955-11-17 DOA: 09/24/2018  PCP: [name], MD  Brief History/Interval Summary: 63 y.o.femalewith medical history significant ofCongestive heart failure, recent COVID-19 positivediagnosed on April 9th, discharged from [facility] on the 22nd, he was discharged on 3 L nasal cannula, reports worsening dyspnea over the last week, lower extremity edema, no improvement with increasing her Lasix dose by PCP, presents to ED with dyspnea, work-up significant for CHF, transferred to [facility] further care, given her COVID-19 test remains positive.   Reason for Visit: Acute on chronic diastolic CHF  Consultants: None  Procedures: None  Antibiotics: None  Subjective/Interval History: Patient states that she is feeling better from a respiratory standpoint.  Not as short of breath as before.  Cough is improving.  She has been noted to have these tremors which she says has been ongoing for 3 months.  Originally noted when she was in [place].  She has not seen any neurologist and does not know if she was placed on any medications.      Assessment/Plan:  Acute on chronic diastolic CHF Patient with known history of diastolic CHF.  Most recent echocardiogram in December showed normal systolic function but showed grade 2 diastolic dysfunction.  Patient had significant edema at the time of admission.  She had elevated BNP levels.  Patient to be continued on intravenous Lasix.  She is diuresing but her weight has remained stable.  We will increase the dose of Lasix to every 8 hours for the next 24 to 48 hours.  Consider repeating chest x-ray on Sunday morning.  PT and OT evaluation.   Chronic hypoxic respiratory failure Patient is on 3 L of oxygen at home at baseline.  Oxygen saturations have improved.  She is on her home level of oxygen requirements.  COVID-19 positive She was noted to have a positive test results.  However she does not appear  to be manifesting acute illness as a result of the same.  Her inflammatory markers were normal.  Continue to monitor for now.  Her symptoms are primarily due to CHF.  No indication for COVID-19 specific treatments.  Diabetes mellitus type 2 with episodes of hypoglycemia Patient had the hypoglycemia persistently.  Lantus has been held for now.  HbA1c was 7.2 earlier this morning.  Hypoglycemic episodes are most likely due to poor oral intake due to her acute illness.  Continue to hold her long-acting insulin for now.     Essential hypertension Pressure is mildly elevated at times.  Losartan was placed on hold due to high creatinine.  Hydralazine as needed.  She is on amlodipine and carvedilol which is being continued.  Acute kidney injury on chronic kidney disease stage III No function is stable.  Monitor urine output.    Normocytic anemia Likely due to chronic disease.  No evidence of overt bleeding.  Check periodically.  Tremors in the hands According to patient this has been ongoing for a few months.  Not noted at rest.  Mostly these action tremors.  Could be essential tremors or physiological tremors.  Her thyroid function tests were unremarkable in December.  May consider repeating this.  She is already on a beta-blocker although carvedilol.  She has been she was told that she may benefit from being evaluated by neurology at some point in time.   DVT Prophylaxis: Lovenox Code Status: Full code Family Communication: Discussed with the patient.  Attempts to call her daughter have not been successful so far.  We will keep trying. Disposition Plan: Mobilize.  Hopefully home when ready for discharge.  PT and OT evaluation.   Medications:  Scheduled: . allopurinol  300 mg Oral Daily  . amLODipine  5 mg Oral QHS  . aspirin EC  81 mg Oral Daily  . atorvastatin  80 mg Oral q1800  . B-complex with vitamin C  1 tablet Oral Daily  . carvedilol  12.5 mg Oral BID WC  . DULoxetine  60 mg Oral  Daily  . enoxaparin (LOVENOX) injection  40 mg Subcutaneous Q24H  . furosemide  40 mg Intravenous Q8H  . gabapentin  300 mg Oral BID  . insulin aspart  0-9 Units Subcutaneous TID WC  . insulin glargine  20 Units Subcutaneous QHS  . pantoprazole  40 mg Oral Daily  . sodium chloride flush  3 mL Intravenous Q12H  . [START ON 09/30/2018] Vitamin D (Ergocalciferol)  50,000 Units Oral Weekly   Continuous: . sodium chloride     OZY:YQMGNO chloride, acetaminophen, ondansetron (ZOFRAN) IV, ondansetron, sodium chloride flush, tiZANidine   Objective:  Vital Signs  Vitals:   09/28/18 0100 09/28/18 0200 09/28/18 0507 09/28/18 0800  BP:   (!) 123/57 (!) 146/70  Pulse: 67 76    Resp: 16 18    Temp:   98.1 F (36.7 C) 97.6 F (36.4 C)  TempSrc:   Oral Oral  SpO2: 98% 98%    Weight:   72.3 kg   Height:        Intake/Output Summary (Last 24 hours) at 09/28/2018 0958 Last data filed at 09/28/2018 0900 Gross per 24 hour  Intake 780 ml  Output 2500 ml  Net -1720 ml   Filed Weights   09/26/18 0500 09/27/18 0440 09/28/18 0507  Weight: 71.7 kg 72 kg 72.3 kg   General appearance: Much more awake and alert today.  In no distress.   Resp: Normal effort at rest.  Few crackles bilaterally at the bases.  No wheezing or rhonchi. Cardio: S1-S2 is normal regular.  No S3-S4.  No rubs murmurs or bruit GI: Abdomen is soft.  Nontender nondistended.  Bowel sounds are present normal.  No masses organomegaly Extremities: No edema.  Full range of motion of lower extremities. Neurologic: Alert and oriented x3.  No focal neurological deficits.  Tremors are noted especially when she moves her hands.  Not noted when hands are resting.    Lab Results:  Data Reviewed: I have personally reviewed following labs and imaging studies  CBC: Recent Labs  Lab 09/24/18 1844 09/25/18 0316  WBC 5.0 4.8  NEUTROABS 3.7 3.4  HGB 10.7* 9.7*  HCT 35.5* 31.8*  MCV 89.0 89.1  PLT 279 037    Basic Metabolic Panel:  Recent Labs  Lab 09/24/18 1844 09/25/18 0316 09/26/18 0250 09/27/18 0300 09/28/18 0300  NA 142 142 139 140 137  K 3.5 3.0* 3.9 4.1 4.5  CL 105 104 101 101 97*  CO2 26 27 28 31  32  GLUCOSE 222* 169* 79 67* 133*  BUN 29* 28* 34* 39* 46*  CREATININE 1.40* 1.33* 1.30* 1.44* 1.31*  CALCIUM 9.4 9.1 9.2 9.4 9.2    GFR: Estimated Creatinine Clearance: 38 mL/min (A) (by C-G formula based on SCr of 1.31 mg/dL (H)).  Cardiac Enzymes: Recent Labs  Lab 09/24/18 1844  TROPONINI <0.03    CBG: Recent Labs  Lab 09/27/18 0830 09/27/18 1126 09/27/18 1633 09/27/18 2122 09/28/18 0730  GLUCAP 71 91 186* 129* 73  Anemia Panel: Recent Labs    09/26/18 0250  FERRITIN 59    Recent Results (from the past 240 hour(s))  SARS Coronavirus 2 (CEPHEID- Performed in [place] hospital lab), Hosp Order     Status: Abnormal   Collection Time: 09/24/18  6:40 PM   Specimen: Nasopharyngeal Swab  Result Value Ref Range Status   SARS Coronavirus 2 POSITIVE (A) NEGATIVE Final    Comment: RESULT CALLED TO, READ BACK BY AND VERIFIED WITH: Kathaleen Bury. RN @2115  ON 06.22.2020 BY COHEN,K (NOTE) If result is NEGATIVE SARS-CoV-2 target nucleic acids are NOT DETECTED. The SARS-CoV-2 RNA is generally detectable in upper and lower  respiratory specimens during the acute phase of infection. The lowest  concentration of SARS-CoV-2 viral copies this assay can detect is 250  copies / mL. A negative result does not preclude SARS-CoV-2 infection  and should not be used as the sole basis for treatment or other  patient management decisions.  A negative result may occur with  improper specimen collection / handling, submission of specimen other  than nasopharyngeal swab, presence of viral mutation(s) within the  areas targeted by this assay, and inadequate number of viral copies  (<250 copies / mL). A negative result must be combined with clinical  observations, patient history, and epidemiological  information. If result is POSITIVE SARS-CoV-2 target nucleic acids are DETE CTED. The SARS-CoV-2 RNA is generally detectable in upper and lower  respiratory specimens during the acute phase of infection.  Positive  results are indicative of active infection with SARS-CoV-2.  Clinical  correlation with patient history and other diagnostic information is  necessary to determine patient infection status.  Positive results do  not rule out bacterial infection or co-infection with other viruses. If result is PRESUMPTIVE POSTIVE SARS-CoV-2 nucleic acids MAY BE PRESENT.   A presumptive positive result was obtained on the submitted specimen  and confirmed on repeat testing.  While 2019 novel coronavirus  (SARS-CoV-2) nucleic acids may be present in the submitted sample  additional confirmatory testing may be necessary for epidemiological  and / or clinical management purposes  to differentiate between  SARS-CoV-2 and other Sarbecovirus currently known to infect humans.  If clinically indicated additional testing with an alternate test  methodology (LAB [phone]) is advised. The SARS-CoV-2 RNA is generally  detectable in upper and lower respiratory specimens during the acute  phase of infection. The expected result is Negative. Fact Sheet for Patients:  StrictlyIdeas.no Fact Sheet for Healthcare Providers: BankingDealers.co.za This test is not yet approved or cleared by the [place] FDA and has been authorized for detection and/or diagnosis of SARS-CoV-2 by FDA under an Emergency Use Authorization (EUA).  This EUA will remain in effect (meaning this test can be used) for the duration of the COVID-19 declaration under Section 564(b)(1) of the Act, 21 U.S.C. section 360bbb-3(b)(1), unless the authorization is terminated or revoked sooner. Performed at [facility], [place] [address] [place], [place] [address]       Radiology  Studies: Dg Chest Port 1 View  Result Date: 09/26/2018 CLINICAL DATA:  Acute CHF. EXAM: PORTABLE CHEST 1 VIEW COMPARISON:  09/24/2018 FINDINGS: There continues to be evidence for pulmonary edema and enlarged central vascular structures. Bilateral pleural effusions. Slightly improved aeration in the lower lungs. Heart size is mildly enlarged. Persistent air bronchograms at the left lung base. IMPRESSION: Slightly improved aeration in the lower lungs with persistent pulmonary edema and bilateral pleural effusions. Findings remain compatible with CHF. Electronically Signed  By: Markus Daft M.D.   On: 09/26/2018 14:10       LOS: 4 days   Bobetta Korf [employer] Pager on [email or web address]  09/28/2018, 9:58 AM

## 2018-09-28 NOTE — Evaluation (Signed)
Occupational Therapy Evaluation Patient Details Name: Kara Hanson MRN: [id number] DOB: 06-05-1955 Today's Date: 09/28/2018    History of Present Illness Pt is a 63 y.o. female with recemt admission to [facility] with COVID-19 from 07/12/18-07/25/18, who presented to ED on 09/24/18 with worsening dyspnea and BLE edema; transferred to [place] given COVID-19 test remains positive. Pt worked up for CHF exacerbation. PMH includes HTN, CKD III, DM2, CHF.   Clinical Impression   This 63 y/o female presents with the above. Pt reports since most recent admission/discharge home she has been using RW for functional mobility, reports has been performing ADL overall at mod independent level. Pt currently requiring close minguard assist for functional mobility using RW; minguard assist for toileting and standing grooming ADL, minA for LB ADL. Pt tolerating standing >60min at sink during completion of ADL tasks this session, initially on 2L O2 but requiring 3L to maintain O2 sats >90%; HR up to 123 with activity. She will benefit from continued acute OT services and recommend follow up [facility] services after discharge to maximize her safety and independence with ADL and mobility. Will follow.     Follow Up Recommendations  Home health OT;Supervision/Assistance - 24 hour    Equipment Recommendations  None recommended by OT           Precautions / Restrictions Precautions Precautions: Fall Restrictions Weight Bearing Restrictions: No      Mobility Bed Mobility Overal bed mobility: Needs Assistance Bed Mobility: Supine to Sit;Sit to Supine     Supine to sit: Modified independent (Device/Increase time) Sit to supine: Min guard   General bed mobility comments: increased effort to bring LEs onto EOB when returning to supine, however no physical assist required; pt able to use bedrails to self assist with boosting towards [facility]  Transfers Overall transfer level: Needs assistance Equipment used: Rolling  walker (2 wheeled) Transfers: Sit to/from Stand Sit to Stand: Min guard         General transfer comment: Able to stand from bed and BSC with RW and min guard    Balance Overall balance assessment: Needs assistance   Sitting balance-Leahy Scale: Good       Standing balance-Leahy Scale: Fair Standing balance comment: Can static stand without UE support                           ADL either performed or assessed with clinical judgement   ADL Overall ADL's : Needs assistance/impaired Eating/Feeding: Modified independent;Sitting   Grooming: Wash/dry hands;Wash/dry face;Oral care;Min guard;Standing Grooming Details (indicate cue type and reason): standing >5 min at sink to perform ADL tasks; pt able to perform fine motor portions of task but with increased time/effort given bil UE tremors Upper Body Bathing: Set up;Min guard;Sitting   Lower Body Bathing: Minimal assistance;Sit to/from stand   Upper Body Dressing : Set up;Min guard;Sitting   Lower Body Dressing: Minimal assistance;Sit to/from stand   Toilet Transfer: Min guard;Ambulation;BSC;RW;Cueing for safety   Toileting- Clothing Manipulation and Hygiene: Min guard;Sit to/from stand;Sitting/lateral lean Toileting - Clothing Manipulation Details (indicate cue type and reason): pt performing peri-care via lateral lean and again in standing, clothing management in standing (gown and undewear) at minguard level     Functional mobility during ADLs: Min guard;Rolling walker General ADL Comments: pt tolerating standing activity well, mostly limited due to respiratory status with prolonged activity  Pertinent Vitals/Pain Pain Assessment: No/denies pain     Hand Dominance     Extremity/Trunk Assessment Upper Extremity Assessment Upper Extremity Assessment: Generalized weakness;RUE deficits/detail;LUE deficits/detail RUE Deficits / Details: pt noted to have bil UE tremors, reports  this is new since most recent admit/discharge RUE Coordination: decreased fine motor LUE Deficits / Details: pt noted to have bil UE tremors, reports this is new since most recent admit/discharge LUE Coordination: decreased fine motor   Lower Extremity Assessment Lower Extremity Assessment: Defer to PT evaluation       Communication Communication Communication: No difficulties   Cognition Arousal/Alertness: Awake/alert Behavior During Therapy: WFL for tasks assessed/performed Overall Cognitive Status: Within Functional Limits for tasks assessed                                 General Comments: pt sleepy initially   General Comments       Exercises     Shoulder Instructions      Home Living Family/patient expects to be discharged to:: Private residence Living Arrangements: Spouse/significant other Available Help at Discharge: Family;Available PRN/intermittently Type of Home: House Home Access: Stairs to enter [employer] of Steps: 4 Entrance Stairs-Rails: Right Home Layout: One level     Bathroom Shower/Tub: Teacher, early years/pre: Standard     Home Equipment: Environmental consultant - 2 wheels;Wheelchair - [employer];Shower seat          Prior Functioning/Environment Level of Independence: Needs assistance  Gait / Transfers Assistance Needed: Ambulation with RW; reports she has been moving well since admission 07/2018 and worked with HHPT, has not needed w/c ADL's / Homemaking Assistance Needed: Pt reports family assist with bathing as needed            OT Problem List: Decreased strength;Decreased range of motion;Decreased activity tolerance;Impaired balance (sitting and/or standing);Cardiopulmonary status limiting activity;Decreased knowledge of use of DME or AE;Impaired UE functional use;Decreased safety awareness;Decreased coordination      OT Treatment/Interventions: Self-care/ADL training;Therapeutic exercise;Energy  conservation;DME and/or AE instruction;Therapeutic activities;Patient/family education;Balance training    OT Goals(Current goals can be found in the care plan section) Acute Rehab OT Goals Patient Stated Goal: Return home OT Goal Formulation: With patient Time For Goal Achievement: 10/12/18 Potential to Achieve Goals: Good  OT Frequency: Min 3X/week   Barriers to D/C:            Co-evaluation              AM-PAC OT "6 Clicks" Daily Activity     Outcome Measure Help from another person eating meals?: None Help from another person taking care of personal grooming?: None Help from another person toileting, which includes using toliet, bedpan, or urinal?: A Little Help from another person bathing (including washing, rinsing, drying)?: A Little Help from another person to put on and taking off regular upper body clothing?: None Help from another person to put on and taking off regular lower body clothing?: A Little 6 Click Score: 21   End of Session Equipment Utilized During Treatment: Rolling walker;Oxygen Nurse Communication: Mobility status  Activity Tolerance: Patient tolerated treatment well Patient left: in bed;with call bell/phone within reach;with bed alarm set  OT Visit Diagnosis: Muscle weakness (generalized) (M62.81);Unsteadiness on feet (R26.81)                Time: 1610-9604 OT Time Calculation (min): 33 min Charges:  OT General Charges $OT Visit: 1 Visit  OT Evaluation $OT Eval Moderate Complexity: 1 Mod OT Treatments $Self Care/Home Management : 8-22 mins  Lou Cal, OT Supplemental Rehabilitation Services Pager [phone] Office [phone]   Raymondo Band 09/28/2018, 12:43 PM

## 2018-09-29 LAB — BASIC METABOLIC PANEL
Anion gap: 18 — ABNORMAL HIGH (ref 5–15)
BUN: 48 mg/dL — ABNORMAL HIGH (ref 8–23)
CO2: 33 mmol/L — ABNORMAL HIGH (ref 22–32)
Calcium: 10.4 mg/dL — ABNORMAL HIGH (ref 8.9–10.3)
Chloride: 89 mmol/L — ABNORMAL LOW (ref 98–111)
Creatinine, Ser: 1.24 mg/dL — ABNORMAL HIGH (ref 0.44–1.00)
GFR calc Af Amer: 54 mL/min — ABNORMAL LOW (ref >60)
GFR calc non Af Amer: 46 mL/min — ABNORMAL LOW (ref >60)
Glucose, Bld: 77 mg/dL (ref 70–99)
Potassium: 4.5 mmol/L (ref 3.5–5.1)
Sodium: 140 mmol/L (ref 135–145)

## 2018-09-29 LAB — AMMONIA: Ammonia: 26 umol/L (ref 9–35)

## 2018-09-29 LAB — T4, FREE: Free T4: 1 ng/dL (ref 0.61–1.12)

## 2018-09-29 LAB — TSH: TSH: 2.559 u[IU]/mL (ref 0.350–4.500)

## 2018-09-29 NOTE — Progress Notes (Signed)
PROGRESS NOTE  Kara Hanson XBL:[id number] DOB: [birth year] DOA: 09/24/2018  PCP: [name], MD  Brief History/Interval Summary: 63 y.o.femalewith medical history significant ofCongestive heart failure, recent COVID-19 positivediagnosed on April 9th, discharged from [facility] on the 22nd, he was discharged on 3 L nasal cannula, reports worsening dyspnea over the last week, lower extremity edema, no improvement with increasing her Lasix dose by PCP, presents to ED with dyspnea, work-up significant for CHF, transferred to [facility] further care, given her COVID-19 test remains positive.   Reason for Visit: Acute on chronic diastolic CHF  Consultants: None  Procedures: None  Antibiotics: None  Subjective/Interval History: Patient states that she is feeling fatigued but overall feels better compared to yesterday.  Denies any difficulty breathing this morning.  Occasional cough.  No nausea vomiting.     Assessment/Plan:  Acute on chronic diastolic CHF Patient with known history of diastolic CHF.  Most recent echocardiogram in December showed normal systolic function but showed grade 2 diastolic dysfunction.  Patient had significant edema at the time of admission.  She had elevated BNP levels.  Patient's dose of Lasix was increased yesterday to 3 times a day.  She has diuresed more over the last 24 hours.  Continue with this dose for today.  Her renal function is stable.  Continue to monitor weight.  Will consider repeating chest x-ray tomorrow or the day after.  PT and OT evaluation.   Chronic hypoxic respiratory failure Patient is on 3 L of oxygen at home at baseline.  She is stable with good oxygen saturations.   COVID-19 positive She was noted to have a positive test results.  However she does not appear to be manifesting acute illness as a result of the same.  Her inflammatory markers were normal.  Continue to monitor for now.  Her symptoms are primarily due to CHF.  No  indication for COVID-19 specific treatments.  Diabetes mellitus type 2 with episodes of hypoglycemia Patient continues to have low glucose levels.  Her Lantus will be discontinued.  Continue SSI.  Oral intake was encouraged.  HbA1c 7.2.    Essential hypertension Continue amlodipine and carvedilol.  Losartan is on hold.  Monitor blood pressures.    Acute kidney injury on chronic kidney disease stage III Renal function is stable.  Creatinine improved to 1.24.  Monitor urine output.    Normocytic anemia Likely due to chronic disease.  No evidence of overt bleeding.  Check periodically.  Tremors in the hands According to patient this has been ongoing for a few months.  Not noted at rest.  Mostly these action tremors.  Could be essential tremors or physiological tremors.  TSH and free T4 are normal.  Ammonia is normal.  Patient will need to be evaluated by neurology in the outpatient setting.  She is already on a beta-blocker although carvedilol.    DVT Prophylaxis: Lovenox Code Status: Full code Family Communication: Discussed with the patient.  Attempts to call her daughter have not been successful so far.  We will keep trying. Disposition Plan: Mobilize.  Hopefully home when ready for discharge.  PT and OT evaluation.   Medications:  Scheduled: . allopurinol  300 mg Oral Daily  . amLODipine  5 mg Oral QHS  . aspirin EC  81 mg Oral Daily  . atorvastatin  80 mg Oral q1800  . B-complex with vitamin C  1 tablet Oral Daily  . carvedilol  12.5 mg Oral BID WC  . DULoxetine  60  mg Oral Daily  . enoxaparin (LOVENOX) injection  40 mg Subcutaneous Q24H  . furosemide  40 mg Intravenous Q8H  . gabapentin  300 mg Oral BID  . insulin aspart  0-9 Units Subcutaneous TID WC  . pantoprazole  40 mg Oral Daily  . sodium chloride flush  3 mL Intravenous Q12H  . [START ON 09/30/2018] Vitamin D (Ergocalciferol)  50,000 Units Oral Weekly   Continuous: . sodium chloride     NLG:XQJJHE chloride,  acetaminophen, ondansetron (ZOFRAN) IV, ondansetron, sodium chloride flush, tiZANidine   Objective:  Vital Signs  Vitals:   09/28/18 2107 09/29/18 0300 09/29/18 0618 09/29/18 0800  BP: (!) 141/62   (!) 152/70  Pulse: 70   66  Resp: 18   14  Temp: 97.9 F (36.6 C)  97.8 F (36.6 C) 97.8 F (36.6 C)  TempSrc: Oral  Oral Oral  SpO2: 97%   98%  Weight:  71.4 kg    Height:        Intake/Output Summary (Last 24 hours) at 09/29/2018 0933 Last data filed at 09/29/2018 0320 Gross per 24 hour  Intake -  Output 2200 ml  Net -2200 ml   Filed Weights   09/27/18 0440 09/28/18 0507 09/29/18 0300  Weight: 72 kg 72.3 kg 71.4 kg   General appearance: Awake alert.  In no distress Resp: Improved air entry but continues to have crackles at the bases.  No wheezing or rhonchi. Cardio: S1-S2 is normal regular.  No S3-S4.  No rubs murmurs or bruit GI: Abdomen is soft.  Nontender nondistended.  Bowel sounds are present normal.  No masses organomegaly Extremities: No edema.  Full range of motion of lower extremities. Neurologic: Alert and oriented x3.  No focal neurological deficits.  Action tremors noted.    Lab Results:  Data Reviewed: I have personally reviewed following labs and imaging studies  CBC: Recent Labs  Lab 09/24/18 1844 09/25/18 0316  WBC 5.0 4.8  NEUTROABS 3.7 3.4  HGB 10.7* 9.7*  HCT 35.5* 31.8*  MCV 89.0 89.1  PLT 279 174    Basic Metabolic Panel: Recent Labs  Lab 09/25/18 0316 09/26/18 0250 09/27/18 0300 09/28/18 0300 09/29/18 0320  NA 142 139 140 137 140  K 3.0* 3.9 4.1 4.5 4.5  CL 104 101 101 97* 89*  CO2 27 28 31  32 33*  GLUCOSE 169* 79 67* 133* 77  BUN 28* 34* 39* 46* 48*  CREATININE 1.33* 1.30* 1.44* 1.31* 1.24*  CALCIUM 9.1 9.2 9.4 9.2 10.4*    GFR: Estimated Creatinine Clearance: 40 mL/min (A) (by C-G formula based on SCr of 1.24 mg/dL (H)).  Cardiac Enzymes: Recent Labs  Lab 09/24/18 1844  TROPONINI <0.03    CBG: Recent Labs  Lab  09/27/18 1126 09/27/18 1633 09/27/18 2122 09/28/18 0730 09/28/18 1157  GLUCAP 91 186* 129* 73 87     Recent Results (from the past 240 hour(s))  SARS Coronavirus 2 (CEPHEID- Performed in [place] hospital lab), Hosp Order     Status: Abnormal   Collection Time: 09/24/18  6:40 PM   Specimen: Nasopharyngeal Swab  Result Value Ref Range Status   SARS Coronavirus 2 POSITIVE (A) NEGATIVE Final    Comment: RESULT CALLED TO, READ BACK BY AND VERIFIED WITH: Kathaleen Bury. RN @2115  ON 06.22.2020 BY COHEN,K (NOTE) If result is NEGATIVE SARS-CoV-2 target nucleic acids are NOT DETECTED. The SARS-CoV-2 RNA is generally detectable in upper and lower  respiratory specimens during the acute phase of infection. The  lowest  concentration of SARS-CoV-2 viral copies this assay can detect is 250  copies / mL. A negative result does not preclude SARS-CoV-2 infection  and should not be used as the sole basis for treatment or other  patient management decisions.  A negative result may occur with  improper specimen collection / handling, submission of specimen other  than nasopharyngeal swab, presence of viral mutation(s) within the  areas targeted by this assay, and inadequate number of viral copies  (<250 copies / mL). A negative result must be combined with clinical  observations, patient history, and epidemiological information. If result is POSITIVE SARS-CoV-2 target nucleic acids are DETE CTED. The SARS-CoV-2 RNA is generally detectable in upper and lower  respiratory specimens during the acute phase of infection.  Positive  results are indicative of active infection with SARS-CoV-2.  Clinical  correlation with patient history and other diagnostic information is  necessary to determine patient infection status.  Positive results do  not rule out bacterial infection or co-infection with other viruses. If result is PRESUMPTIVE POSTIVE SARS-CoV-2 nucleic acids MAY BE PRESENT.   A presumptive  positive result was obtained on the submitted specimen  and confirmed on repeat testing.  While 2019 novel coronavirus  (SARS-CoV-2) nucleic acids may be present in the submitted sample  additional confirmatory testing may be necessary for epidemiological  and / or clinical management purposes  to differentiate between  SARS-CoV-2 and other Sarbecovirus currently known to infect humans.  If clinically indicated additional testing with an alternate test  methodology (LAB [phone]) is advised. The SARS-CoV-2 RNA is generally  detectable in upper and lower respiratory specimens during the acute  phase of infection. The expected result is Negative. Fact Sheet for Patients:  StrictlyIdeas.no Fact Sheet for Healthcare Providers: BankingDealers.co.za This test is not yet approved or cleared by the [place] FDA and has been authorized for detection and/or diagnosis of SARS-CoV-2 by FDA under an Emergency Use Authorization (EUA).  This EUA will remain in effect (meaning this test can be used) for the duration of the COVID-19 declaration under Section 564(b)(1) of the Act, 21 U.S.C. section 360bbb-3(b)(1), unless the authorization is terminated or revoked sooner. Performed at [facility], [place] [address], [place], [place] [address]       Radiology Studies: No results found.     LOS: 5 days   Reyne Falconi [employer] on [email or web address]  09/29/2018, 9:33 AM

## 2018-09-30 ENCOUNTER — Inpatient Hospital Stay (HOSPITAL_COMMUNITY): Payer: Medicare Other

## 2018-09-30 LAB — BASIC METABOLIC PANEL
Anion gap: 12 (ref 5–15)
BUN: 49 mg/dL — ABNORMAL HIGH (ref 8–23)
CO2: 34 mmol/L — ABNORMAL HIGH (ref 22–32)
Calcium: 9.5 mg/dL (ref 8.9–10.3)
Chloride: 88 mmol/L — ABNORMAL LOW (ref 98–111)
Creatinine, Ser: 1.22 mg/dL — ABNORMAL HIGH (ref 0.44–1.00)
GFR calc Af Amer: 55 mL/min — ABNORMAL LOW (ref >60)
GFR calc non Af Amer: 47 mL/min — ABNORMAL LOW (ref >60)
Glucose, Bld: 137 mg/dL — ABNORMAL HIGH (ref 70–99)
Potassium: 4.2 mmol/L (ref 3.5–5.1)
Sodium: 134 mmol/L — ABNORMAL LOW (ref 135–145)

## 2018-09-30 MED ORDER — FLEET ENEMA 7-19 GM/118ML RE ENEM: 1.0000 | ENEMA | Freq: Every day | RECTAL | Status: DC | PRN

## 2018-09-30 MED ORDER — POLYETHYLENE GLYCOL 3350 17 G PO PACK
17.0000 g | PACK | Freq: Every day | ORAL | Status: DC
  Administered 2018-09-30 – 2018-10-01 (×2): 17 g via ORAL
  Filled 2018-09-30 (×2): qty 1

## 2018-09-30 NOTE — Progress Notes (Addendum)
Occupational Therapy Treatment Patient Details Name: Kara Hanson MRN: [id number] DOB: 07-02-1955 Today's Date: 09/30/2018    History of present illness Pt is a 63 y.o. female with recemt admission to [facility] with COVID-19 from 07/12/18-07/25/18, who presented to ED on 09/24/18 with worsening dyspnea and BLE edema; transferred to [place] given COVID-19 test remains positive. Pt worked up for CHF exacerbation. PMH includes HTN, CKD III, DM2, CHF.   OT comments  Pt progressing towards established OT goals. Pt performing toileting with Min Guard A for safety; SpO2 dropping to 87% on 2L O2. Elevated to 3L O2 to complete sponge bath at sink. Pt performing sponge bath with Min Guard A and demonstrating increased activity tolerance to complete bath in standing. SpO2 maintaining >90% on 3L during bathe. Continue to recommend dc to home with HHOT and will continue to follow acutely as admitted.    Follow Up Recommendations  Home health OT;Supervision/Assistance - 24 hour    Equipment Recommendations  None recommended by OT    Recommendations for Other Services      Precautions / Restrictions Precautions Precautions: Fall Restrictions Weight Bearing Restrictions: No       Mobility Bed Mobility Overal bed mobility: Needs Assistance Bed Mobility: Supine to Sit     Supine to sit: Modified independent (Device/Increase time)     General bed mobility comments: Increased time and effort  Transfers Overall transfer level: Needs assistance Equipment used: Rolling walker (2 wheeled) Transfers: Sit to/from Stand Sit to Stand: Min guard         General transfer comment: Min Guard A for safety    Balance Overall balance assessment: Needs assistance   Sitting balance-Leahy Scale: Good Sitting balance - Comments: Able to reach bilateral feet sitting EOB     Standing balance-Leahy Scale: Fair Standing balance comment: Can static stand without UE support                            ADL either performed or assessed with clinical judgement   ADL Overall ADL's : Needs assistance/impaired     Grooming: Wash/dry face;Wash/dry hands   Upper Body Bathing: Min guard;Standing Upper Body Bathing Details (indicate cue type and reason): standing at sink to complete sponge bath with Min Guard A for safety. SpO2 >90% on 3L Lower Body Bathing: Min guard;Sit to/from stand Lower Body Bathing Details (indicate cue type and reason): standing at sink to complete sponge bath with Min Guard A for safety. SpO2 >90% on 3L Upper Body Dressing : Min guard;Standing Upper Body Dressing Details (indicate cue type and reason): Assist to don new gown with Min Guard A for safety Lower Body Dressing: Min guard;Sit to/from stand Lower Body Dressing Details (indicate cue type and reason): Min Guard A for safety in standing and pt able to adjust socks while sitting at [employer] Transfer: Min guard;Ambulation;RW;Cueing for safety;Regular Toilet;Grab bars Toilet Transfer Details (indicate cue type and reason): SpO2 dropping to 87% on 2L  Toileting- Clothing Manipulation and Hygiene: Min guard;Sit to/from stand;Sitting/lateral lean Toileting - Clothing Manipulation Details (indicate cue type and reason): Cues to recall to pull down depends and pull back up when finished     Functional mobility during ADLs: Min guard;Rolling walker General ADL Comments: Demonstrating increased activity tolerance and very motivated to participate in bathing     Vision       Perception     Praxis      Cognition Arousal/Alertness: Awake/alert  Behavior During Therapy: WFL for tasks assessed/performed Overall Cognitive Status: Within Functional Limits for tasks assessed                                 General Comments: Speaking softly        Exercises     Shoulder Instructions       General Comments SpO2 dropping to 87% on 2L. Elevating to 3L to complete bathing at sink. Pt  maintaining SpO2 >90% on 3L.     Pertinent Vitals/ Pain       Pain Assessment: No/denies pain  Home Living                                          Prior Functioning/Environment              Frequency  Min 3X/week        Progress Toward Goals  OT Goals(current goals can now be found in the care plan section)  Progress towards OT goals: Progressing toward goals  Acute Rehab OT Goals Patient Stated Goal: Return home OT Goal Formulation: With patient Time For Goal Achievement: 10/12/18 Potential to Achieve Goals: Good ADL Goals Pt Will Perform Grooming: with modified independence;standing Pt Will Perform Lower Body Bathing: with modified independence;sit to/from stand Pt Will Perform Lower Body Dressing: with modified independence;sit to/from stand Pt Will Transfer to Toilet: with modified independence;ambulating Pt Will Perform Toileting - Clothing Manipulation and hygiene: with modified independence;sit to/from stand Pt/caregiver will Perform Home Exercise Program: Increased strength;Both right and left upper extremity;With theraband;With written HEP provided;Independently Additional ADL Goal #1: Pt will independently demonstrate/verbalize at least 3 energy conservation techniques during functional task completion.  Plan Discharge plan remains appropriate    Co-evaluation                 AM-PAC OT "6 Clicks" Daily Activity     Outcome Measure   Help from another person eating meals?: None Help from another person taking care of personal grooming?: None Help from another person toileting, which includes using toliet, bedpan, or urinal?: A Little Help from another person bathing (including washing, rinsing, drying)?: A Little Help from another person to put on and taking off regular upper body clothing?: None Help from another person to put on and taking off regular lower body clothing?: A Little 6 Click Score: 21    End of Session  Equipment Utilized During Treatment: Rolling walker;Oxygen(2-3L)  OT Visit Diagnosis: Muscle weakness (generalized) (M62.81);Unsteadiness on feet (R26.81)   Activity Tolerance Patient tolerated treatment well   Patient Left with call bell/phone within reach;in chair   Nurse Communication Mobility status        Time: 5277-8242 OT Time Calculation (min): 32 min  Charges: OT General Charges $OT Visit: 1 Visit OT Treatments $Self Care/Home Management : 23-37 mins  [place], OTR/L Acute Rehab Pager: [phone] Office: [place] 09/30/2018, 4:51 PM

## 2018-09-30 NOTE — Progress Notes (Signed)
PROGRESS NOTE  Kara Hanson ZRA:[id number] DOB: October 17, 1955 DOA: 09/24/2018  PCP: [name], MD  Brief History/Interval Summary: 63 y.o.femalewith medical history significant ofCongestive heart failure, recent COVID-19 positivediagnosed on April 9th, discharged from [facility] on the 22nd, he was discharged on 3 L nasal cannula, reports worsening dyspnea over the last week, lower extremity edema, no improvement with increasing her Lasix dose by PCP, presents to ED with dyspnea, work-up significant for CHF, transferred to [facility] further care, given her COVID-19 test remains positive.   Reason for Visit: Acute on chronic diastolic CHF  Consultants: None  Procedures: None  Antibiotics: None  Subjective/Interval History: Patient states that she is feeling better.  Not as short of breath as previously.  No cough.  No chest pain.  Feels constipated.  Requesting a laxative.     Assessment/Plan:  Acute on chronic diastolic CHF Patient with known history of diastolic CHF.  Most recent echocardiogram in December showed normal systolic function but showed grade 2 diastolic dysfunction.  Patient had significant edema at the time of admission.  She had elevated BNP levels.  Patient was initially on twice a day Lasix without significant diuresis or decrease in weight.  She was changed over to Lasix 3 times a day with improvement in diuresis.  Her weight has been decreasing as well.  Continue with his dose for another 24 hours.  Chest x-ray done today shows improvement in pulmonary edema.  Oxygen requirements are stable.  PT and OT evaluation.    Chronic hypoxic respiratory failure Patient is on 3 L of oxygen at home at baseline.  She is stable with good oxygen saturations.   COVID-19 positive She was noted to have a positive test results.  However she does not appear to be manifesting acute illness as a result of the same.  Her inflammatory markers were normal.  Continue to monitor for  now.  Her symptoms are primarily due to CHF.  No indication for COVID-19 specific treatments.  Diabetes mellitus type 2 with episodes of hypoglycemia Patient is low glucose levels are due to poor oral intake.  Her long-acting insulin has been held.  HbA1c 7.2.  Continue SSI.     Essential hypertension Continue amlodipine and carvedilol.  Losartan is on hold.  Blood pressure remains stable  Acute kidney injury on chronic kidney disease stage III Renal function has improved and close to baseline.  Continue to monitor urine output.  Normocytic anemia Likely due to chronic disease.  No evidence of overt bleeding.  Check periodically.  Tremors in the hands According to patient this has been ongoing for a few months.  Not noted at rest.  Mostly these action tremors.  Could be essential tremors or physiological tremors.  TSH and free T4 are normal.  Ammonia is normal.  Patient will need to be evaluated by neurology in the outpatient setting.  She is already on a beta-blocker although carvedilol.    Constipation Laxatives have been ordered.  DVT Prophylaxis: Lovenox Code Status: Full code Family Communication: Discussed with the patient.  Attempts to call her daughter have not been successful so far.  We will keep trying. Disposition Plan: Continue to mobilize.  She will likely need home health.   Medications:  Scheduled: . allopurinol  300 mg Oral Daily  . amLODipine  5 mg Oral QHS  . aspirin EC  81 mg Oral Daily  . atorvastatin  80 mg Oral q1800  . B-complex with vitamin C  1 tablet Oral  Daily  . carvedilol  12.5 mg Oral BID WC  . DULoxetine  60 mg Oral Daily  . enoxaparin (LOVENOX) injection  40 mg Subcutaneous Q24H  . furosemide  40 mg Intravenous Q8H  . gabapentin  300 mg Oral BID  . insulin aspart  0-9 Units Subcutaneous TID WC  . pantoprazole  40 mg Oral Daily  . polyethylene glycol  17 g Oral Daily  . sodium chloride flush  3 mL Intravenous Q12H  . Vitamin D (Ergocalciferol)   50,000 Units Oral Weekly   Continuous: . sodium chloride     CXK:GYJEHU chloride, acetaminophen, ondansetron (ZOFRAN) IV, ondansetron, sodium chloride flush, sodium phosphate, tiZANidine   Objective:  Vital Signs  Vitals:   09/29/18 2042 09/30/18 0456 09/30/18 0556 09/30/18 0759  BP: (!) 150/70  (!) 146/72 139/63  Pulse: 72  69 72  Resp: 18  (!) 26 16  Temp: 98.1 F (36.7 C)  98.1 F (36.7 C) 97.6 F (36.4 C)  TempSrc: Oral  Oral Oral  SpO2: 96%  95% 97%  Weight:  70.1 kg    Height:        Intake/Output Summary (Last 24 hours) at 09/30/2018 1109 Last data filed at 09/30/2018 0800 Gross per 24 hour  Intake -  Output 3200 ml  Net -3200 ml   Filed Weights   09/28/18 0507 09/29/18 0300 09/30/18 0456  Weight: 72.3 kg 71.4 kg 70.1 kg   General appearance: Awake alert.  In no distress Resp: Improved aeration bilaterally.  Few crackles at the bases.  No rhonchi or wheezing. Cardio: S1-S2 is normal regular.  No S3-S4.  No rubs murmurs or bruit GI: Abdomen is soft.  Nontender nondistended.  Bowel sounds are present normal.  No masses organomegaly Extremities: No edema.  Full range of motion of lower extremities. Neurologic: Alert and oriented x3.  No focal neurological deficits.  Action tremors noted.    Lab Results:  Data Reviewed: I have personally reviewed following labs and imaging studies  CBC: Recent Labs  Lab 09/24/18 1844 09/25/18 0316  WBC 5.0 4.8  NEUTROABS 3.7 3.4  HGB 10.7* 9.7*  HCT 35.5* 31.8*  MCV 89.0 89.1  PLT 279 314    Basic Metabolic Panel: Recent Labs  Lab 09/26/18 0250 09/27/18 0300 09/28/18 0300 09/29/18 0320 09/30/18 0212  NA 139 140 137 140 134*  K 3.9 4.1 4.5 4.5 4.2  CL 101 101 97* 89* 88*  CO2 28 31 32 33* 34*  GLUCOSE 79 67* 133* 77 137*  BUN 34* 39* 46* 48* 49*  CREATININE 1.30* 1.44* 1.31* 1.24* 1.22*  CALCIUM 9.2 9.4 9.2 10.4* 9.5    GFR: Estimated Creatinine Clearance: 40.2 mL/min (A) (by C-G formula based on SCr  of 1.22 mg/dL (H)).  Cardiac Enzymes: Recent Labs  Lab 09/24/18 1844  TROPONINI <0.03    CBG: Recent Labs  Lab 09/27/18 1126 09/27/18 1633 09/27/18 2122 09/28/18 0730 09/28/18 1157  GLUCAP 91 186* 129* 73 87     Recent Results (from the past 240 hour(s))  SARS Coronavirus 2 (CEPHEID- Performed in [place] hospital lab), Hosp Order     Status: Abnormal   Collection Time: 09/24/18  6:40 PM   Specimen: Nasopharyngeal Swab  Result Value Ref Range Status   SARS Coronavirus 2 POSITIVE (A) NEGATIVE Final    Comment: RESULT CALLED TO, READ BACK BY AND VERIFIED WITH: Kathaleen Bury. RN @2115  ON 06.22.2020 BY COHEN,K (NOTE) If result is NEGATIVE SARS-CoV-2 target nucleic acids are  NOT DETECTED. The SARS-CoV-2 RNA is generally detectable in upper and lower  respiratory specimens during the acute phase of infection. The lowest  concentration of SARS-CoV-2 viral copies this assay can detect is 250  copies / mL. A negative result does not preclude SARS-CoV-2 infection  and should not be used as the sole basis for treatment or other  patient management decisions.  A negative result may occur with  improper specimen collection / handling, submission of specimen other  than nasopharyngeal swab, presence of viral mutation(s) within the  areas targeted by this assay, and inadequate number of viral copies  (<250 copies / mL). A negative result must be combined with clinical  observations, patient history, and epidemiological information. If result is POSITIVE SARS-CoV-2 target nucleic acids are DETE CTED. The SARS-CoV-2 RNA is generally detectable in upper and lower  respiratory specimens during the acute phase of infection.  Positive  results are indicative of active infection with SARS-CoV-2.  Clinical  correlation with patient history and other diagnostic information is  necessary to determine patient infection status.  Positive results do  not rule out bacterial infection or  co-infection with other viruses. If result is PRESUMPTIVE POSTIVE SARS-CoV-2 nucleic acids MAY BE PRESENT.   A presumptive positive result was obtained on the submitted specimen  and confirmed on repeat testing.  While 2019 novel coronavirus  (SARS-CoV-2) nucleic acids may be present in the submitted sample  additional confirmatory testing may be necessary for epidemiological  and / or clinical management purposes  to differentiate between  SARS-CoV-2 and other Sarbecovirus currently known to infect humans.  If clinically indicated additional testing with an alternate test  methodology (LAB [id number]) is advised. The SARS-CoV-2 RNA is generally  detectable in upper and lower respiratory specimens during the acute  phase of infection. The expected result is Negative. Fact Sheet for Patients:  StrictlyIdeas.no Fact Sheet for Healthcare Providers: BankingDealers.co.za This test is not yet approved or cleared by the [place] FDA and has been authorized for detection and/or diagnosis of SARS-CoV-2 by FDA under an Emergency Use Authorization (EUA).  This EUA will remain in effect (meaning this test can be used) for the duration of the COVID-19 declaration under Section 564(b)(1) of the Act, 21 U.S.C. section 360bbb-3(b)(1), unless the authorization is terminated or revoked sooner. Performed at [facility], [place] [address] [place], [place] [address]       Radiology Studies: Dg Chest Port 1 View  Result Date: 09/30/2018 CLINICAL DATA:  Acute CHF EXAM: PORTABLE CHEST 1 VIEW COMPARISON:  Chest x-rays dated 09/26/2018 and 09/24/2018 FINDINGS: Stable cardiomegaly. Improved aeration at the LEFT lung base. No evidence of pulmonary edema on today's exam. No pleural effusion or pneumothorax seen. IMPRESSION: Improved aeration at the LEFT lung base. No evidence of pneumonia or pulmonary edema on today's exam. Stable cardiomegaly.  Electronically Signed   By: Franki Cabot M.D.   On: 09/30/2018 07:13       LOS: 6 days   [facility] Pager on [email or web address]  09/30/2018, 11:09 AM

## 2018-10-01 LAB — BASIC METABOLIC PANEL
Anion gap: 10 (ref 5–15)
BUN: 58 mg/dL — ABNORMAL HIGH (ref 8–23)
CO2: 34 mmol/L — ABNORMAL HIGH (ref 22–32)
Calcium: 9.2 mg/dL (ref 8.9–10.3)
Chloride: 89 mmol/L — ABNORMAL LOW (ref 98–111)
Creatinine, Ser: 1.59 mg/dL — ABNORMAL HIGH (ref 0.44–1.00)
GFR calc Af Amer: 40 mL/min — ABNORMAL LOW (ref >60)
GFR calc non Af Amer: 34 mL/min — ABNORMAL LOW (ref >60)
Glucose, Bld: 136 mg/dL — ABNORMAL HIGH (ref 70–99)
Potassium: 4.4 mmol/L (ref 3.5–5.1)
Sodium: 133 mmol/L — ABNORMAL LOW (ref 135–145)

## 2018-10-01 LAB — GLUCOSE, CAPILLARY
Glucose-Capillary: 158 mg/dL — ABNORMAL HIGH (ref 70–99)
Glucose-Capillary: 191 mg/dL — ABNORMAL HIGH (ref 70–99)
Glucose-Capillary: 116 mg/dL — ABNORMAL HIGH (ref 70–99)
Glucose-Capillary: 53 mg/dL — ABNORMAL LOW (ref 70–99)
Glucose-Capillary: 86 mg/dL (ref 70–99)
Glucose-Capillary: 198 mg/dL — ABNORMAL HIGH (ref 70–99)
Glucose-Capillary: 122 mg/dL — ABNORMAL HIGH (ref 70–99)
Glucose-Capillary: 143 mg/dL — ABNORMAL HIGH (ref 70–99)
Glucose-Capillary: 268 mg/dL — ABNORMAL HIGH (ref 70–99)
Glucose-Capillary: 152 mg/dL — ABNORMAL HIGH (ref 70–99)
Glucose-Capillary: 186 mg/dL — ABNORMAL HIGH (ref 70–99)
Glucose-Capillary: 175 mg/dL — ABNORMAL HIGH (ref 70–99)
Glucose-Capillary: 145 mg/dL — ABNORMAL HIGH (ref 70–99)

## 2018-10-01 MED ORDER — POLYETHYLENE GLYCOL 3350 17 G PO PACK
17.0000 g | PACK | Freq: Two times a day (BID) | ORAL | Status: DC
  Administered 2018-10-01 – 2018-10-02 (×2): 17 g via ORAL
  Filled 2018-10-01 (×2): qty 1

## 2018-10-01 MED ORDER — FUROSEMIDE 10 MG/ML IJ SOLN: 40.0000 mg | Freq: Two times a day (BID) | INTRAMUSCULAR | Status: DC

## 2018-10-01 NOTE — Progress Notes (Signed)
Physical Therapy Treatment Patient Details Name: Kara Hanson MRN: [id number] DOB: 1955-09-19 Today's Date: 10/01/2018    History of Present Illness Pt is a 63 y.o. female with recemt admission to [facility] with COVID-19 from 07/12/18-07/25/18, who presented to ED on 09/24/18 with worsening dyspnea and BLE edema; transferred to [place] given COVID-19 test remains positive. Pt worked up for CHF exacerbation. PMH includes HTN, CKD III, DM2, CHF.    PT Comments    The patient ambualted x 280' on 2 L O2 with SaO2>92%. Continue PT for mobility.   Follow Up Recommendations  No PT follow up;Supervision for mobility/OOB     Equipment Recommendations  None recommended by PT    Recommendations for Other Services       Precautions / Restrictions Precautions Precautions: Fall    Mobility  Bed Mobility Overal bed mobility: Modified Independent                Transfers Overall transfer level: Needs assistance Equipment used: Rolling walker (2 wheeled) Transfers: Sit to/from Stand Sit to Stand: Min guard         General transfer comment: Min Guard A for safety  Ambulation/Gait Ambulation/Gait assistance: Counsellor (Feet): 280 Feet Assistive device: Rolling walker (2 wheeled) Gait Pattern/deviations: Step-through pattern;Decreased stride length     General Gait Details: no balance losses   Stairs             Wheelchair Mobility    Modified Rankin (Stroke Patients Only)       Balance                                            Cognition   Behavior During Therapy: WFL for tasks assessed/performed                                   General Comments: Speaking softly      Exercises      General Comments        Pertinent Vitals/Pain Pain Assessment: No/denies pain    Home Living                      Prior Function            PT Goals (current goals can now be found in the care plan  section) Progress towards PT goals: Progressing toward goals    Frequency    Min 3X/week      PT Plan Current plan remains appropriate    Co-evaluation              AM-PAC PT "6 Clicks" Mobility   Outcome Measure  Help needed turning from your back to your side while in a flat bed without using bedrails?: None Help needed moving from lying on your back to sitting on the side of a flat bed without using bedrails?: None Help needed moving to and from a bed to a chair (including a wheelchair)?: A Little Help needed standing up from a chair using your arms (e.g., wheelchair or bedside chair)?: A Little Help needed to walk in hospital room?: A Little Help needed climbing 3-5 steps with a railing? : A Little 6 Click Score: 20    End of Session Equipment Utilized During Treatment: Oxygen Activity Tolerance: Patient tolerated  treatment well Patient left: in chair;with call bell/phone within reach Nurse Communication: Mobility status PT Visit Diagnosis: Other abnormalities of gait and mobility (R26.89);Muscle weakness (generalized) (M62.81)     Time: 5927-6394 PT Time Calculation (min) (ACUTE ONLY): 19 min  Charges:  $Gait Training: 8-22 mins                     Kara Hanson PT Acute Rehabilitation Services [phone] Office [phone]    Kara Hanson 10/01/2018, 5:20 PM

## 2018-10-01 NOTE — Progress Notes (Signed)
PROGRESS NOTE  Kara Hanson NKN:[id number] DOB: [birth year] DOA: 09/24/2018  PCP: [name], MD  Brief History/Interval Summary: 63 y.o.femalewith medical history significant ofCongestive heart failure, recent COVID-19 positivediagnosed on April 9th, discharged from [facility] on the 22nd, he was discharged on 3 L nasal cannula, reports worsening dyspnea over the last week, lower extremity edema, no improvement with increasing her Lasix dose by PCP, presents to ED with dyspnea, work-up significant for CHF, transferred to [facility] further care, given her COVID-19 test remains positive.   Reason for Visit: Acute on chronic diastolic CHF  Consultants: None  Procedures: None  Antibiotics: None  Subjective/Interval History: Patient states that she is feeling better.  Not as short of breath as before.  No chest pain.  No cough.  Had a bowel movement this morning.   Assessment/Plan:  Acute on chronic diastolic CHF Patient with known history of diastolic CHF.  Most recent echocardiogram in December showed normal systolic function but showed grade 2 diastolic dysfunction.  Patient had significant edema at the time of admission.  She had elevated BNP levels.  Patient was initially on twice a day Lasix without significant diuresis or decrease in weight.  She was changed over to Lasix 3 times a day with improvement in diuresis.  She has diuresed well but her weight still remains about the same.  Her sodium has gone down.  Her creatinine has climbed up along with BUN.  We will hold off on Lasix for today.  Recheck her labs tomorrow.  Oxygen requirements are stable.  Chronic hypoxic respiratory failure Patient is on 3 L of oxygen at home at baseline.  She is stable with good oxygen saturations.   COVID-19 positive She was noted to have a positive test results.  However she does not appear to be manifesting acute illness as a result of the same.  Her inflammatory markers were normal.   Continue to monitor for now.  Her symptoms are primarily due to CHF.  No indication for COVID-19 specific treatments.  Diabetes mellitus type 2 with episodes of hypoglycemia Patient had hypoglycemic episodes which was thought to be secondary to poor oral intake in the setting of long-acting insulin.  The long-acting insulin was discontinued.  CBGs appear to have stabilized.  HbA1c 7.2.  Continue just SSI for now.  Reinstitute long-acting insulin depending on her CBGs.    Essential hypertension Continue amlodipine and carvedilol.  Losartan is on hold.  Blood pressure is reasonably well controlled.  Acute kidney injury on chronic kidney disease stage III Been around 1.4-1.5.  Is stable.  Slight bump in BUN and creatinine noted today which could be due to diuresis.  We will hold her Lasix for today.  Monitor urine output.    Normocytic anemia Likely due to chronic disease.  No evidence of overt bleeding.  Will check hemoglobin tomorrow.  Tremors in the hands According to patient this has been ongoing for a few months.  Not noted at rest.  Mostly these action tremors.  Could be essential tremors or physiological tremors.  TSH and free T4 are normal.  Ammonia is normal.  Patient will need to be evaluated by neurology in the outpatient setting.  She is already on a beta-blocker although carvedilol.    Constipation Laxatives have been ordered.  DVT Prophylaxis: Lovenox Code Status: Full code Family Communication: Discussed with the patient.  Attempts to call her daughter have not been successful.   Disposition Plan: Continue to mobilize.  She will likely  need home health.  Anticipate discharge tomorrow if her renal function remains stable.   Medications:  Scheduled: . allopurinol  300 mg Oral Daily  . amLODipine  5 mg Oral QHS  . aspirin EC  81 mg Oral Daily  . atorvastatin  80 mg Oral q1800  . B-complex with vitamin C  1 tablet Oral Daily  . carvedilol  12.5 mg Oral BID WC  . DULoxetine   60 mg Oral Daily  . enoxaparin (LOVENOX) injection  40 mg Subcutaneous Q24H  . [START ON 10/02/2018] furosemide  40 mg Intravenous Q12H  . gabapentin  300 mg Oral BID  . insulin aspart  0-9 Units Subcutaneous TID WC  . pantoprazole  40 mg Oral Daily  . polyethylene glycol  17 g Oral Daily  . sodium chloride flush  3 mL Intravenous Q12H  . Vitamin D (Ergocalciferol)  50,000 Units Oral Weekly   Continuous: . sodium chloride     CBJ:SEGBTD chloride, acetaminophen, ondansetron (ZOFRAN) IV, ondansetron, sodium chloride flush, sodium phosphate, tiZANidine   Objective:  Vital Signs  Vitals:   09/30/18 2124 10/01/18 0445 10/01/18 0841 10/01/18 0900  BP: 138/63 127/63 (!) 141/60   Pulse: 70 74 80 75  Resp: 12 13 18 16   Temp: 98.2 F (36.8 C) 98 F (36.7 C)  98.2 F (36.8 C)  TempSrc: Oral Oral  Oral  SpO2: 99% 98% 99% 100%  Weight:  71.3 kg    Height:        Intake/Output Summary (Last 24 hours) at 10/01/2018 1149 Last data filed at 09/30/2018 1700 Gross per 24 hour  Intake -  Output 1500 ml  Net -1500 ml   Filed Weights   09/29/18 0300 09/30/18 0456 10/01/18 0445  Weight: 71.4 kg 70.1 kg 71.3 kg   General appearance: Awake alert.  In no distress Resp: Improved air entry bilaterally.  Less crackles compared to last couple of days.  No wheezing or rhonchi.  Normal effort at rest. Cardio: S1-S2 is normal regular.  No S3-S4.  No rubs murmurs or bruit GI: Abdomen is soft.  Nontender nondistended.  Bowel sounds are present normal.  No masses organomegaly Extremities: No edema.  Full range of motion of lower extremities. Neurologic: Alert and oriented x3.  No focal neurological deficits.  Action tremors noted.  Slight improvement.   Lab Results:  Data Reviewed: I have personally reviewed following labs and imaging studies  CBC: Recent Labs  Lab 09/24/18 1844 09/25/18 0316  WBC 5.0 4.8  NEUTROABS 3.7 3.4  HGB 10.7* 9.7*  HCT 35.5* 31.8*  MCV 89.0 89.1  PLT 279 276     Basic Metabolic Panel: Recent Labs  Lab 09/27/18 0300 09/28/18 0300 09/29/18 0320 09/30/18 0212 10/01/18 0429  NA 140 137 140 134* 133*  K 4.1 4.5 4.5 4.2 4.4  CL 101 97* 89* 88* 89*  CO2 31 32 33* 34* 34*  GLUCOSE 67* 133* 77 137* 136*  BUN 39* 46* 48* 49* 58*  CREATININE 1.44* 1.31* 1.24* 1.22* 1.59*  CALCIUM 9.4 9.2 10.4* 9.5 9.2    GFR: Estimated Creatinine Clearance: 31.1 mL/min (A) (by C-G formula based on SCr of 1.59 mg/dL (H)).  Cardiac Enzymes: Recent Labs  Lab 09/24/18 1844  TROPONINI <0.03    CBG: Recent Labs  Lab 09/30/18 0710 09/30/18 1132 09/30/18 1637 09/30/18 2122 10/01/18 0841  GLUCAP 122* 143* 268* 152* 186*     Recent Results (from the past 240 hour(s))  SARS Coronavirus 2 (CEPHEID-  Performed in [facility] hospital lab), Hosp Order     Status: Abnormal   Collection Time: 09/24/18  6:40 PM   Specimen: Nasopharyngeal Swab  Result Value Ref Range Status   SARS Coronavirus 2 POSITIVE (A) NEGATIVE Final    Comment: RESULT CALLED TO, READ BACK BY AND VERIFIED WITH: Kathaleen Bury. RN @2115  ON 06.22.2020 BY COHEN,K (NOTE) If result is NEGATIVE SARS-CoV-2 target nucleic acids are NOT DETECTED. The SARS-CoV-2 RNA is generally detectable in upper and lower  respiratory specimens during the acute phase of infection. The lowest  concentration of SARS-CoV-2 viral copies this assay can detect is 250  copies / mL. A negative result does not preclude SARS-CoV-2 infection  and should not be used as the sole basis for treatment or other  patient management decisions.  A negative result may occur with  improper specimen collection / handling, submission of specimen other  than nasopharyngeal swab, presence of viral mutation(s) within the  areas targeted by this assay, and inadequate number of viral copies  (<250 copies / mL). A negative result must be combined with clinical  observations, patient history, and epidemiological information. If result is  POSITIVE SARS-CoV-2 target nucleic acids are DETE CTED. The SARS-CoV-2 RNA is generally detectable in upper and lower  respiratory specimens during the acute phase of infection.  Positive  results are indicative of active infection with SARS-CoV-2.  Clinical  correlation with patient history and other diagnostic information is  necessary to determine patient infection status.  Positive results do  not rule out bacterial infection or co-infection with other viruses. If result is PRESUMPTIVE POSTIVE SARS-CoV-2 nucleic acids MAY BE PRESENT.   A presumptive positive result was obtained on the submitted specimen  and confirmed on repeat testing.  While 2019 novel coronavirus  (SARS-CoV-2) nucleic acids may be present in the submitted sample  additional confirmatory testing may be necessary for epidemiological  and / or clinical management purposes  to differentiate between  SARS-CoV-2 and other Sarbecovirus currently known to infect humans.  If clinically indicated additional testing with an alternate test  methodology (LAB [phone]) is advised. The SARS-CoV-2 RNA is generally  detectable in upper and lower respiratory specimens during the acute  phase of infection. The expected result is Negative. Fact Sheet for Patients:  StrictlyIdeas.no Fact Sheet for Healthcare Providers: BankingDealers.co.za This test is not yet approved or cleared by the [place] FDA and has been authorized for detection and/or diagnosis of SARS-CoV-2 by FDA under an Emergency Use Authorization (EUA).  This EUA will remain in effect (meaning this test can be used) for the duration of the COVID-19 declaration under Section 564(b)(1) of the Act, 21 U.S.C. section 360bbb-3(b)(1), unless the authorization is terminated or revoked sooner. Performed at [facility], [place] [address], [place], [place] [address]       Radiology Studies: Dg Chest Port 1  View  Result Date: 09/30/2018 CLINICAL DATA:  Acute CHF EXAM: PORTABLE CHEST 1 VIEW COMPARISON:  Chest x-rays dated 09/26/2018 and 09/24/2018 FINDINGS: Stable cardiomegaly. Improved aeration at the LEFT lung base. No evidence of pulmonary edema on today's exam. No pleural effusion or pneumothorax seen. IMPRESSION: Improved aeration at the LEFT lung base. No evidence of pneumonia or pulmonary edema on today's exam. Stable cardiomegaly. Electronically Signed   By: Franki Cabot M.D.   On: 09/30/2018 07:13       LOS: 7 days   [facility] [facility] Pager on [email or web address]  10/01/2018, 11:49 AM

## 2018-10-02 ENCOUNTER — Other Ambulatory Visit: Payer: Self-pay | Admitting: Family Medicine

## 2018-10-02 ENCOUNTER — Telehealth: Payer: Self-pay | Admitting: Family Medicine

## 2018-10-02 DIAGNOSIS — E1140 Type 2 diabetes mellitus with diabetic neuropathy, unspecified: Secondary | ICD-10-CM

## 2018-10-02 LAB — BASIC METABOLIC PANEL
Anion gap: 11 (ref 5–15)
BUN: 64 mg/dL — ABNORMAL HIGH (ref 8–23)
CO2: 35 mmol/L — ABNORMAL HIGH (ref 22–32)
Calcium: 9.6 mg/dL (ref 8.9–10.3)
Chloride: 88 mmol/L — ABNORMAL LOW (ref 98–111)
Creatinine, Ser: 1.64 mg/dL — ABNORMAL HIGH (ref 0.44–1.00)
GFR calc Af Amer: 38 mL/min — ABNORMAL LOW (ref >60)
GFR calc non Af Amer: 33 mL/min — ABNORMAL LOW (ref >60)
Glucose, Bld: 168 mg/dL — ABNORMAL HIGH (ref 70–99)
Potassium: 4.3 mmol/L (ref 3.5–5.1)
Sodium: 134 mmol/L — ABNORMAL LOW (ref 135–145)

## 2018-10-02 LAB — CBC
HCT: 34.2 % — ABNORMAL LOW (ref 36.0–46.0)
Hemoglobin: 10.5 g/dL — ABNORMAL LOW (ref 12.0–15.0)
MCH: 26.8 pg (ref 26.0–34.0)
MCHC: 30.7 g/dL (ref 30.0–36.0)
MCV: 87.2 fL (ref 80.0–100.0)
Platelets: 283 10*3/uL (ref 150–400)
RBC: 3.92 MIL/uL (ref 3.87–5.11)
RDW: 14.1 % (ref 11.5–15.5)
WBC: 3.9 10*3/uL — ABNORMAL LOW (ref 4.0–10.5)
nRBC: 0 % (ref 0.0–0.2)

## 2018-10-02 LAB — GLUCOSE, CAPILLARY
Glucose-Capillary: 186 mg/dL — ABNORMAL HIGH (ref 70–99)
Glucose-Capillary: 159 mg/dL — ABNORMAL HIGH (ref 70–99)
Glucose-Capillary: 180 mg/dL — ABNORMAL HIGH (ref 70–99)

## 2018-10-02 MED ORDER — POLYETHYLENE GLYCOL 3350 17 G PO PACK: 17.0000 g | PACK | Freq: Every day | ORAL | 0 refills | Status: DC

## 2018-10-02 MED ORDER — FUROSEMIDE 20 MG PO TABS: 40.0000 mg | ORAL_TABLET | Freq: Every day | ORAL | Status: DC

## 2018-10-02 MED ORDER — FUROSEMIDE 40 MG PO TABS: 40.0000 mg | ORAL_TABLET | Freq: Every day | ORAL | 0 refills | Status: DC

## 2018-10-02 MED ORDER — BASAGLAR KWIKPEN 100 UNIT/ML ~~LOC~~ SOPN: 5.0000 [IU] | PEN_INJECTOR | Freq: Every day | SUBCUTANEOUS | 6 refills | Status: DC

## 2018-10-02 NOTE — Discharge Instructions (Signed)
Heart Failure, Self Care °Heart failure is a serious condition. This sheet explains things you need to do to take care of yourself at home. To help you stay as healthy as possible, you may be asked to change your diet, take certain medicines, and make other changes in your life. Your doctor may also give you more specific instructions. If you have problems or questions, call your doctor. °What are the risks? °Having heart failure makes it more likely for you to have some problems. These problems can get worse if you do not take good care of yourself. Problems may include: °· Blood clotting problems. This may cause a stroke. °· Damage to the kidneys, liver, or lungs. °· Abnormal heart rhythms. °Supplies needed: °· Scale for weighing yourself. °· Blood pressure monitor. °· Notebook. °· Medicines. °How to care for yourself when you have heart failure °Medicines °Take over-the-counter and prescription medicines only as told by your doctor. Take your medicines every day. °· Do not stop taking your medicine unless your doctor tells you to do so. °· Do not skip any medicines. °· Get your prescriptions refilled before you run out of medicine. This is important. °Eating and drinking ° °· Eat heart-healthy foods. Talk with a diet specialist (dietitian) to create an eating plan. °· Choose foods that: °? Have no trans fat. °? Are low in saturated fat and cholesterol. °· Choose healthy foods, such as: °? Fresh or frozen fruits and vegetables. °? Fish. °? Low-fat (lean) meats. °? Legumes, such as beans, peas, and lentils. °? Fat-free or low-fat dairy products. °? Whole-grain foods. °? High-fiber foods. °· Limit salt (sodium) if told by your doctor. Ask your diet specialist to tell you which seasonings are healthy for your heart. °· Cook in healthy ways instead of frying. Healthy ways of cooking include roasting, grilling, broiling, baking, poaching, steaming, and stir-frying. °· Limit how much fluid you drink, if told by your  doctor. °Alcohol use °· Do not drink alcohol if: °? Your doctor tells you not to drink. °? Your heart was damaged by alcohol, or you have very bad heart failure. °? You are pregnant, may be pregnant, or are planning to become pregnant. °· If you drink alcohol: °? Limit how much you use to: °§ 0-1 drink a day for women. °§ 0-2 drinks a day for men. °? Be aware of how much alcohol is in your drink. In the [place] one drink equals one 12 oz bottle of beer (355 mL), one 5 oz glass of wine (148 mL), or one 1½ oz glass of hard liquor (44 mL). °Lifestyle ° °· Do not use any products that contain nicotine or tobacco, such as cigarettes, e-cigarettes, and chewing tobacco. If you need help quitting, ask your doctor. °? Do not use nicotine gum or patches before talking to your doctor. °· Do not use illegal drugs. °· Lose weight if told by your doctor. °· Do physical activity if told by your doctor. Talk to your doctor before you begin an exercise if: °? You are an older adult. °? You have very bad heart failure. °· Learn to manage stress. If you need help, ask your doctor. °· Get rehab (rehabilitation) to help you stay independent and to help with your quality of life. °· Plan time to rest when you get tired. °Check weight and blood pressure ° °· Weigh yourself every day. This will help you to know if fluid is building up in your body. °? Weigh yourself every morning   after you pee (urinate) and before you eat breakfast. °? Wear the same amount of clothing each time. °? Write down your daily weight. Give your record to your doctor. °· Check and write down your blood pressure as told by your doctor. °· Check your pulse as told by your doctor. °Dealing with very hot and very cold weather °· If it is very hot: °? Avoid activities that take a lot of energy. °? Use air conditioning or fans, or find a cooler place. °? Avoid caffeine and alcohol. °? Wear clothing that is loose-fitting, lightweight, and light-colored. °· If it is very  cold: °? Avoid activities that take a lot of energy. °? Layer your clothes. °? Wear mittens or gloves, a hat, and a scarf when you go outside. °? Avoid alcohol. °Follow these instructions at home: °· Stay up to date with shots (vaccines). Get pneumococcal and flu (influenza) shots. °· Keep all follow-up visits as told by your doctor. This is important. °Contact a doctor if: °· You gain weight quickly. °· You have increasing shortness of breath. °· You cannot do your normal activities. °· You get tired easily. °· You cough a lot. °· You don't feel like eating or feel like you may vomit (nauseous). °· You become puffy (swell) in your hands, feet, ankles, or belly (abdomen). °· You cannot sleep well because it is hard to breathe. °· You feel like your heart is beating fast (palpitations). °· You get dizzy when you stand up. °Get help right away if: °· You have trouble breathing. °· You or someone else notices a change in your behavior, such as having trouble staying awake. °· You have chest pain or discomfort. °· You pass out (faint). °These symptoms may be an emergency. Do not wait to see if the symptoms will go away. Get medical help right away. Call your local emergency services (911 in the [place] Do not drive yourself to the hospital. °Summary °· Heart failure is a serious condition. To care for yourself, you may have to change your diet, take medicines, and make other lifestyle changes. °· Take your medicines every day. Do not stop taking them unless your doctor tells you to do so. °· Eat heart-healthy foods, such as fresh or frozen fruits and vegetables, fish, lean meats, legumes, fat-free or low-fat dairy products, and whole-grain or high-fiber foods. °· Ask your doctor if you can drink alcohol. You may have to stop alcohol use if you have very bad heart failure. °· Contact your doctor if you gain weight quickly or feel that your heart is beating too fast. Get help right away if you pass out, or have chest pain  or trouble breathing. °This information is not intended to replace advice given to you by your health care provider. Make sure you discuss any questions you have with your health care provider. °Document Released: 07/04/2018 Document Revised: 07/03/2018 Document Reviewed: 07/04/2018 °Elsevier Patient Education © 2020 [employer] ° °

## 2018-10-02 NOTE — Telephone Encounter (Signed)
New Message   Pt's daughter is calling with questions regarding the pt's oxygen. Please f/u

## 2018-10-02 NOTE — TOC Transition Note (Signed)
Transition of Care [facility]) - CM/SW Discharge Note   Patient Details  Name: Kara Hanson MRN: [id number] Date of Birth: 06-07-1955  [facility]) CM/SW Contact:  Midge Minium RN, BSN, NCM-BC, ACM-RN [phone] (working remotely) Phone Number: 10/02/2018, 10:54 AM   Clinical Narrative:    CM is following for dispositional needs. CM spoke to the patient via phone to discuss the POC, with the patient requesting CM contact her daughter Kara Hanson, since she will be transitioning to her daughter's residence. CM contacted patients daughter, with identifiers confirmed. Patients daughter is agreeable to [facility] services. CMS HH Compare list discussed with Kara Hanson selected. HH referral given to Kara Lank RN, [facility] liaison; AVS updated. Patients daughter had questions regarding the patients home oxygen; inquiring if an oxygen concentrator could be delivered to her home, since she will be staying at her residence and not her brother's. CM contacted [employer] and spoke to [place] ([employer] rep) to discuss the daughter's concerns. [employer] is able to deliver an oxygen concentrator to the daughter's residence and will pickup the old concentrator from the son's residence. A portable oxygen tank will be delivered to [facility] prior to discharge. Patients son will provide transportation home. Patient will discharge to: [address] [place] [address].     Final next level of care: [place] Barriers to Discharge: No Barriers Identified   Patient Goals and CMS Choice Patient states their goals for this hospitalization and ongoing recovery are:: "to get well" CMS Medicare.gov Compare Post Acute Care list provided to:: Patient Represenative (must comment)(Verbal permission given to discuss the POC with Kara Hanson) Choice offered to / list presented to : Adult Children   Discharge Plan and Services In-house Referral: NA Discharge Planning Services: CM Consult Post Acute  Care Choice: NA          DME Arranged: N/A DME Agency: NA       HH Arranged: RN, Disease Management, OT [place] Agency: [place] Date [facility] Agency Contacted: 10/02/18 Time HH Agency Contacted: [phone] Representative spoke with at [place]: Kara Lank RN liaison  Social Determinants of Health ([place]) Interventions     Readmission Risk Interventions Readmission Risk Prevention Plan 10/02/2018 09/28/2018  Transportation Screening Complete Complete  PCP or Specialist Appt within 3-5 Days Complete Not Complete  Not Complete comments - Continued medical workup; will assess prior to discharge  [place] or [place] Complete Complete  Social Work Consult for [place] Planning/Counseling Complete Complete  Palliative Care Screening Not Applicable Not Applicable  Medication Review Press photographer) Complete Complete  Some recent data might be hidden

## 2018-10-02 NOTE — Discharge Summary (Signed)
[facility]  Physician Discharge Summary   Patient ID: Kara Hanson MRN: [id number] DOB/AGE: 09-16-1955 63 y.o.  Admit date: 09/24/2018 Discharge date: 10/02/2018  PCP: Kara Rakes, MD  DISCHARGE DIAGNOSES:  Cute on chronic diastolic CHF Chronic hypoxic respiratory failure on home oxygen COVID-19 positive Type II with episodes of hypoglycemia Essential hypertension Chronic kidney disease stage 3 Action tremors requiring outpatient evaluation Normocytic anemia Constipation  RECOMMENDATIONS FOR OUTPATIENT FOLLOW UP: 1. Patient will need to have her renal function panel checked in 1 to 2 weeks 2. May need referral to neurology if her tremors do not improve   Home Health: Ordered Equipment/Devices: None  CODE STATUS: Full code  DISCHARGE CONDITION: fair  Diet recommendation: Notified carbohydrate  INITIAL HISTORY: 63 y.o.femalewith medical history significant ofCongestive heart failure, recent COVID-19 positivediagnosed on April 9th, discharged from [facility] on the 22nd, he was discharged on 3 L nasal cannula, reports worsening dyspnea over the last week, lower extremity edema, no improvement with increasing her Lasix dose by PCP, presents to ED with dyspnea, work-up significant for CHF, transferred to [facility] further care, given her COVID-19 test remains positive.   HOSPITAL COURSE:   Acute on chronic diastolic CHF Patient with known history of diastolic CHF.  Most recent echocardiogram in December showed normal systolic function but showed grade 2 diastolic dysfunction.  Patient had significant edema at the time of admission.  She had elevated BNP levels.  Patient was initially on twice a day Lasix without significant diuresis or decrease in weight.  She was changed over to Lasix 3 times a day with improvement in diuresis.    Patient has diuresed well.  Symptomatically she has improved.  Due to slight increase in BUN and creatinine dose of Lasix was held  yesterday and will be held today.  Renal function is stable for the most part.  Okay to resume her oral Lasix from tomorrow.  Okay for discharge home.  She continues to have good urine output.  Oxygen requirements are stable.    Chronic hypoxic respiratory failure Patient is on 3 L of oxygen at home at baseline.  She is stable with good oxygen saturations.   COVID-19 positive She was noted to have a positive test results.  However she does not appear to be manifesting acute illness as a result of the same.  Her inflammatory markers were normal.    She did not get any COVID-19 specific treatments.  Diabetes mellitus type 2 with episodes of hypoglycemia Patient had hypoglycemic episodes which was thought to be secondary to poor oral intake in the setting of long-acting insulin.  The long-acting insulin was discontinued.    BG's have stabilized.  She will be discharged on a lower dose of her long-acting insulin.  HbA1c 7.2.  She should continue to check her CBGs at home.    Essential hypertension Continue to hold losartan.  Continue amlodipine and carvedilol.  Acute kidney injury on chronic kidney disease stage III Creatinine at baseline is around 1.4-1.5.  Creatinine is 1.6 this morning.  Slight increase noted but stable for the most part.  Continues to urinate.  Will need repeat renal function panel in 1 to 2 weeks.  Hold Lasix for today also and can resume oral Lasix from tomorrow.    Normocytic anemia Likely due to chronic disease.  No evidence of overt bleeding.    Hemoglobin is stable  Tremors in the hands According to patient this has been ongoing for a few months.  Not  noted at rest.  Mostly these action tremors.  Could be essential tremors or physiological tremors.  TSH and free T4 are normal.  Ammonia is normal.  Patient will need to be evaluated by neurology in the outpatient setting.  She is already on a beta-blocker although carvedilol.    Constipation Miralax.  Overall  stable.  Patient keen on going home.  Home health will be ordered.  Okay for discharge today.     PERTINENT LABS:  The results of significant diagnostics from this hospitalization (including imaging, microbiology, ancillary and laboratory) are listed below for reference.    Microbiology: Recent Results (from the past 240 hour(s))  SARS Coronavirus 2 (CEPHEID- Performed in [facility] hospital lab), Hosp Order     Status: Abnormal   Collection Time: 09/24/18  6:40 PM   Specimen: Nasopharyngeal Swab  Result Value Ref Range Status   SARS Coronavirus 2 POSITIVE (A) NEGATIVE Final    Comment: RESULT CALLED TO, READ BACK BY AND VERIFIED WITH: Kathaleen Bury. RN @2115  ON 06.22.2020 BY COHEN,K (NOTE) If result is NEGATIVE SARS-CoV-2 target nucleic acids are NOT DETECTED. The SARS-CoV-2 RNA is generally detectable in upper and lower  respiratory specimens during the acute phase of infection. The lowest  concentration of SARS-CoV-2 viral copies this assay can detect is 250  copies / mL. A negative result does not preclude SARS-CoV-2 infection  and should not be used as the sole basis for treatment or other  patient management decisions.  A negative result may occur with  improper specimen collection / handling, submission of specimen other  than nasopharyngeal swab, presence of viral mutation(s) within the  areas targeted by this assay, and inadequate number of viral copies  (<250 copies / mL). A negative result must be combined with clinical  observations, patient history, and epidemiological information. If result is POSITIVE SARS-CoV-2 target nucleic acids are DETE CTED. The SARS-CoV-2 RNA is generally detectable in upper and lower  respiratory specimens during the acute phase of infection.  Positive  results are indicative of active infection with SARS-CoV-2.  Clinical  correlation with patient history and other diagnostic information is  necessary to determine patient infection status.   Positive results do  not rule out bacterial infection or co-infection with other viruses. If result is PRESUMPTIVE POSTIVE SARS-CoV-2 nucleic acids MAY BE PRESENT.   A presumptive positive result was obtained on the submitted specimen  and confirmed on repeat testing.  While 2019 novel coronavirus  (SARS-CoV-2) nucleic acids may be present in the submitted sample  additional confirmatory testing may be necessary for epidemiological  and / or clinical management purposes  to differentiate between  SARS-CoV-2 and other Sarbecovirus currently known to infect humans.  If clinically indicated additional testing with an alternate test  methodology (LAB [phone]) is advised. The SARS-CoV-2 RNA is generally  detectable in upper and lower respiratory specimens during the acute  phase of infection. The expected result is Negative. Fact Sheet for Patients:  StrictlyIdeas.no Fact Sheet for Healthcare Providers: BankingDealers.co.za This test is not yet approved or cleared by the [place] FDA and has been authorized for detection and/or diagnosis of SARS-CoV-2 by FDA under an Emergency Use Authorization (EUA).  This EUA will remain in effect (meaning this test can be used) for the duration of the COVID-19 declaration under Section 564(b)(1) of the Act, 21 U.S.C. section 360bbb-3(b)(1), unless the authorization is terminated or revoked sooner. Performed at [facility], [place] [address] [place], [place] [address]  Labs: Basic Metabolic Panel: Recent Labs  Lab 09/28/18 0300 09/29/18 0320 09/30/18 0212 10/01/18 0429 10/02/18 0225  NA 137 140 134* 133* 134*  K 4.5 4.5 4.2 4.4 4.3  CL 97* 89* 88* 89* 88*  CO2 32 33* 34* 34* 35*  GLUCOSE 133* 77 137* 136* 168*  BUN 46* 48* 49* 58* 64*  CREATININE 1.31* 1.24* 1.22* 1.59* 1.64*  CALCIUM 9.2 10.4* 9.5 9.2 9.6    Recent Labs  Lab 09/29/18 0320  AMMONIA 26   CBC:  Recent Labs  Lab 10/02/18 0225  WBC 3.9*  HGB 10.5*  HCT 34.2*  MCV 87.2  PLT 283   BNP: BNP (last 3 results) Recent Labs    07/20/18 1340 09/17/18 1456 09/24/18 1844  BNP 1,042.9* 151.3* 434.7*    CBG: Recent Labs  Lab 10/01/18 0841 10/01/18 1156 10/01/18 1604 10/01/18 2129 10/02/18 0716  GLUCAP 186* 175* 145* 186* 159*     IMAGING STUDIES Dg Chest 2 View  Result Date: 09/21/2018 CLINICAL DATA:  Pt. States SOB x 3 wks. With weakness. HX of Diabetes, heart failure. Nonsmoker, HTN EXAM: CHEST - 2 VIEW COMPARISON:  07/21/2018 FINDINGS: Worsening airspace consolidation in the lung bases, left greater than right. Diffuse interstitial infiltrates or edema slightly increased. Small bilateral pleural effusions are now conspicuous. Heart size upper limits normal. No pneumothorax. Visualized bones unremarkable. Cholecystectomy clips. IMPRESSION: Worsening bilateral edema/infiltrates and small effusions. Electronically Signed   By: Lucrezia Europe M.D.   On: 09/21/2018 09:26   Dg Chest Port 1 View  Result Date: 09/30/2018 CLINICAL DATA:  Acute CHF EXAM: PORTABLE CHEST 1 VIEW COMPARISON:  Chest x-rays dated 09/26/2018 and 09/24/2018 FINDINGS: Stable cardiomegaly. Improved aeration at the LEFT lung base. No evidence of pulmonary edema on today's exam. No pleural effusion or pneumothorax seen. IMPRESSION: Improved aeration at the LEFT lung base. No evidence of pneumonia or pulmonary edema on today's exam. Stable cardiomegaly. Electronically Signed   By: Franki Cabot M.D.   On: 09/30/2018 07:13   Dg Chest Port 1 View  Result Date: 09/26/2018 CLINICAL DATA:  Acute CHF. EXAM: PORTABLE CHEST 1 VIEW COMPARISON:  09/24/2018 FINDINGS: There continues to be evidence for pulmonary edema and enlarged central vascular structures. Bilateral pleural effusions. Slightly improved aeration in the lower lungs. Heart size is mildly enlarged. Persistent air bronchograms at the left lung base. IMPRESSION:  Slightly improved aeration in the lower lungs with persistent pulmonary edema and bilateral pleural effusions. Findings remain compatible with CHF. Electronically Signed   By: Markus Daft M.D.   On: 09/26/2018 14:10   Dg Chest Portable 1 View  Result Date: 09/24/2018 CLINICAL DATA:  CHF, respiratory failure, shortness of breath EXAM: PORTABLE CHEST 1 VIEW COMPARISON:  09/20/2018 FINDINGS: Cardiomegaly noted with increased vascular congestion and perihilar/basilar interstitial edema pattern with pleural effusions. Pattern compatible with CHF. There is associated bibasilar partial collapse/consolidation. No pneumothorax. Trachea midline. Degenerative changes of the spine. IMPRESSION: Slight worsening CHF pattern compared to 09/20/2018. Electronically Signed   By: Jerilynn Mages.  Shick M.D.   On: 09/24/2018 19:22    DISCHARGE EXAMINATION: Vitals:   10/01/18 2034 10/02/18 0500 10/02/18 0634 10/02/18 0719  BP: 128/65  131/60 (!) 148/63  Pulse: 75  77 75  Resp: 20  17 13   Temp: 98.1 F (36.7 C)  98.3 F (36.8 C) 98.3 F (36.8 C)  TempSrc: Oral  Oral Oral  SpO2: 97%  97% 95%  Weight:  71 kg    Height:  General appearance: Awake alert.  In no distress Resp: Improved air entry bilaterally.  Less crackles.  No wheezing or rhonchi.  Normal effort. Cardio: S1-S2 is normal regular.  No S3-S4.  No rubs murmurs or bruit GI: Abdomen is soft.  Nontender nondistended.  Bowel sounds are present normal.  No masses organomegaly Extremities: No edema.  Full range of motion of lower extremities. Neurologic: Alert and oriented x3.  No focal neurological deficits.  Action tremors noted.   DISPOSITION: Home with home health  Discharge Instructions    (HEART FAILURE PATIENTS) Call MD:  Anytime you have any of the following symptoms: 1) 3 pound weight gain in 24 hours or 5 pounds in 1 week 2) shortness of breath, with or without a dry hacking cough 3) swelling in the hands, feet or stomach 4) if you have to sleep on  extra pillows at night in order to breathe.   Complete by: As directed    Call MD for:  difficulty breathing, headache or visual disturbances   Complete by: As directed    Call MD for:  extreme fatigue   Complete by: As directed    Call MD for:  persistant dizziness or light-headedness   Complete by: As directed    Call MD for:  persistant nausea and vomiting   Complete by: As directed    Call MD for:  temperature >100.4   Complete by: As directed    Discharge instructions   Complete by: As directed    Please be sure to follow-up with your primary care provider in 1 week.  You will need to have blood work done to check your kidneys in 1 to 2 weeks.  Please take your lower dose of insulin as recommended.  As your blood glucose levels go up your insulin dose will have to be increased.  If your morning blood glucose level is greater than 180, go up on your nighttime dose of Basaglar by 4 units every other day.  Call your doctor for further instructions.  Your doctor will need to refer you to a neurologist if your tremors persist.  You were cared for by a hospitalist during your hospital stay. If you have any questions about your discharge medications or the care you received while you were in the hospital after you are discharged, you can call the unit and asked to speak with the hospitalist on call if the hospitalist that took care of you is not available. Once you are discharged, your primary care physician will handle any further medical issues. Please note that NO REFILLS for any discharge medications will be authorized once you are discharged, as it is imperative that you return to your primary care physician (or establish a relationship with a primary care physician if you do not have one) for your aftercare needs so that they can reassess your need for medications and monitor your lab values. If you do not have a primary care physician, you can call [phone] for a physician referral.   Increase  activity slowly   Complete by: As directed          Allergies as of 10/02/2018      Reactions   Garlic Shortness Of Breath, Itching, Swelling   Hand itching and swelling   Latex Itching   Morphine And Related Itching, Other (See Comments)   Headache   Other Itching   Reaction to newspaper ink -itching and headache      Medication List  STOP taking these medications   dicyclomine 20 MG tablet Commonly known as: BENTYL   ezetimibe 10 MG tablet Commonly known as: ZETIA   lansoprazole 15 MG capsule Commonly known as: PREVACID   losartan 25 MG tablet Commonly known as: COZAAR   metoCLOPramide 5 MG tablet Commonly known as: Reglan     TAKE these medications   Accu-Chek FastClix Lancets Misc USE AS DIRECTED TO TEST BLOOD SUGAR THREE TIMES DAILY   Accu-Chek Guide w/Device Kit 1 each by Does not apply route 3 (three) times daily.   allopurinol 300 MG tablet Commonly known as: ZYLOPRIM Take 1 tablet (300 mg total) by mouth daily.   amLODipine 5 MG tablet Commonly known as: NORVASC TAKE 1 TABLET (5 MG TOTAL) BY MOUTH AT BEDTIME.   aspirin EC 81 MG tablet Take 81 mg by mouth daily.   atorvastatin 80 MG tablet Commonly known as: LIPITOR Take 1 tablet (80 mg total) by mouth daily at 6 PM for 30 days.   Basaglar KwikPen 100 UNIT/ML Sopn Inject 0.05 mLs (5 Units total) into the skin at bedtime. What changed: how much to take   carvedilol 12.5 MG tablet Commonly known as: COREG Take 1 tablet (12.5 mg total) by mouth 2 (two) times daily with a meal.   DULoxetine 60 MG capsule Commonly known as: Cymbalta Take 1 capsule (60 mg total) by mouth daily. For chronic back and neck pain   Easy Comfort Pen Needles 31G X 5 MM Misc Generic drug: Insulin Pen Needle USE FOUR TIMES PER DAY FOR INSULIN ADMINISTRATION What changed: See the new instructions.   ergocalciferol 1.25 MG (50000 UT) capsule Commonly known as: Drisdol Take 1 capsule (50,000 Units total) by mouth  once a week.   furosemide 40 MG tablet Commonly known as: LASIX Take 1 tablet (40 mg total) by mouth daily. Start taking on: October 03, 2018 What changed:   how much to take  when to take this   gabapentin 300 MG capsule Commonly known as: NEURONTIN TAKE 1 CAPSULE (300 MG TOTAL) BY MOUTH 2 (TWO) TIMES DAILY.   glucose blood test strip Commonly known as: Accu-Chek Guide USE AS DIRECTED TO TEST BLOOD SUGAR THREE TIMES DAILY   insulin aspart 100 UNIT/ML injection Commonly known as: novoLOG 0 to 12 units subcutaneously 3 times daily before meals as per sliding scale   Lancet Device Misc Use as instructed 3 times daily   Misc. Devices Misc Portable oxygen concentrator.  Diagnosis-chronic respiratory failure.   omeprazole 20 MG capsule Commonly known as: PRILOSEC Take 20 mg by mouth daily.   ondansetron 4 MG tablet Commonly known as: ZOFRAN Take 1 tablet (4 mg total) by mouth every 6 (six) hours as needed for nausea.   polyethylene glycol 17 g packet Commonly known as: MIRALAX / GLYCOLAX Take 17 g by mouth daily.   Super B Complex/C Caps Take 1 capsule by mouth daily.   tiZANidine 4 MG tablet Commonly known as: ZANAFLEX TAKE 1 TABLET (4 MG TOTAL) BY MOUTH EVERY 8 (EIGHT) HOURS AS NEEDED FOR MUSCLE SPASMS.        Follow-up Information    Kara Rakes, MD. Schedule an appointment as soon as possible for a visit in 1 week(s).   Specialty: Family Medicine Contact information: [place] [address] [address] [facility] Follow up.   Why: home oxygen Contact information: [place] [address] [phone]  Care, [facility] Follow up.   Specialty: Home Health Services Why: [facility] Contact information: [place] [address] [address] [phone]           TOTAL DISCHARGE TIME: 35 mins  [place]  [facility]  [employer] operational officer on [email or web address]  10/02/2018, 11:50 AM

## 2018-10-02 NOTE — Telephone Encounter (Signed)
Kara Hanson call was returned and the phone disconnected. The number for [facility]([phone]) was left on her voicemail.

## 2018-10-03 ENCOUNTER — Telehealth: Payer: Self-pay

## 2018-10-03 NOTE — Telephone Encounter (Addendum)
Transition Care Management Follow-up Telephone Call  Date of discharge and from where: 10/02/2018 , [facility]  How have you been since you were released from the hospital? She said that she is starting to feel better.   Any questions or concerns? Her only concern was being able to get her labwork done in 1-2 weeks as directed.   Items Reviewed:  Did the pt receive and understand the discharge instructions provided? She has them and stated that her daughter helps manage her care.   Medications obtained and verified? She said that she has all medications, including the miralax.  She noted that her daughter manages the medications. They have the medication list and do not have any questions. She explained how she takes basaglar and the sliding scale insulin, she also noted she is to increase her basaglar as her blood sugar goes up.  As per AVS, she is to increase her basaglar by 4 units every other day if her morning blood sugar is greater than 180. This CM stressed the importance of having her daughter review the list again and note the medications that have changed or been discontinued. This CM instructed her to call back with any questions and the patient stated that she understood.   Any new allergies since your discharge?   Do you have support at home? She is now living with her daughter.  She said that eventually she would like to find a handicap apartment for herself and her grandson.  She said that he left her son's house because no one was going to be at home to help her.   Other (ie: DME, Home Health, etc) [facility] has been ordered but she has not heard from them yet.  She has a walker and wheelchair but stated that she does not need them   Has glucometer.  Checks blood sugars three times daily.  This morning blood sugar - 178.  Instructed her to keep a log of blood sugar results for review with Dr Margarita Rana.   She needs a scale.   Has O2 from [place] - room  concentrator and POC. Using it at 3L continuously.   Functional Questionnaire: (I = Independent and D = Dependent) ADL's: generally independent, daughter will assist when needed    Follow up appointments reviewed:    PCP Hospital f/u appt confirmed? Appointment scheduled for 10/08/2018 @ 1050, [facility] f/u appt confirmed? Cardiology 10/22/2018  Are transportation arrangements needed? no  If their condition worsens, is the pt aware to call  their PCP or go to the ED? yes  Was the patient provided with contact information for the PCP's office or ED? Yes, she has the phone number for the clinic.  Was the pt encouraged to call back with questions or concerns? yes

## 2018-10-03 NOTE — Telephone Encounter (Signed)
Dr Margarita Rana ,  From the discharge call  -   She has a televisit scheduled for 10/08/2018.   Her only concern was being able to get her labwork done in 1-2 weeks as directed.   She said that she has all medications . Is now living with Kara Hanson.  No questions about medications.  She explained that  she takes basaglar and the sliding scale insulin, she also noted she is to increase her basaglar as her blood sugar goes up.  As per AVS, she is to increase her basaglar by 4 units every other day if her morning blood sugar is greater than 180.

## 2018-10-03 NOTE — Telephone Encounter (Signed)
At her televisit we will decide when it is safe to come in for blood work.

## 2018-10-08 ENCOUNTER — Encounter: Payer: Self-pay | Admitting: Family Medicine

## 2018-10-08 ENCOUNTER — Ambulatory Visit: Payer: Medicare Other | Attending: Family Medicine | Admitting: Family Medicine

## 2018-10-08 ENCOUNTER — Other Ambulatory Visit: Payer: Self-pay

## 2018-10-08 DIAGNOSIS — U071 COVID-19: Secondary | ICD-10-CM

## 2018-10-08 DIAGNOSIS — E1140 Type 2 diabetes mellitus with diabetic neuropathy, unspecified: Secondary | ICD-10-CM | POA: Diagnosis not present

## 2018-10-08 DIAGNOSIS — I5041 Acute combined systolic (congestive) and diastolic (congestive) heart failure: Secondary | ICD-10-CM | POA: Diagnosis not present

## 2018-10-08 DIAGNOSIS — Z794 Long term (current) use of insulin: Secondary | ICD-10-CM | POA: Diagnosis not present

## 2018-10-08 NOTE — Progress Notes (Signed)
Patient has been called and DOB has been verified. Patient has been screened and transferred to PCP to start phone visit.     

## 2018-10-08 NOTE — Progress Notes (Signed)
Virtual Visit via Telephone Note  I connected with Kara Hanson, on 10/08/2018 at 10:45 AM by telephone due to the COVID-19 pandemic and verified that I am speaking with the correct person using two identifiers.   Consent: I discussed the limitations, risks, security and privacy concerns of performing an evaluation and management service by telephone and the availability of in person appointments. I also discussed with the patient that there may be a patient responsible charge related to this service. The patient expressed understanding and agreed to proceed.   Location of Patient: Home  Location of Provider: Clinic   Persons participating in Telemedicine visit: Donita Newland Farrington-CMA Dr. Felecia Shelling     History of Present Illness: Kara Hanson is a 63 year old female with a history of type 2 diabetes mellitus (A1c 7.2), hypertension, congestive heart failure (EF 50-55% from echo of 03/2018), hospitalization at [facility] for COVID-19 related pneumonia from 07/20/2018 through 07/26/2018 (Sars-CoV-2 positive -  diagnosed in [place] on 07/11/18) with recent hospitalization for acute on chronic respiratory failure secondary to acute CHF exacerbation at [facility].  She had been referred to the ED due to worsening shortness of breath not responsive to increased oral dose of Lasix with increased oxygen requirement.  Chest x-ray had revealed slight worsening of CHF pattern compared to previous CXR. She underwent diuresis with improvement in symptoms; COVID-19 test remained positive but she had no acute illness as a result of COVID-19 as per hospital notes inflammatory markers were normal. Her creatinine trended up from a baseline of 1.3-1.64 at discharge.  Today she reports doing well, denies pedal edema or weight gain.  She denies being dyspneic and no longer needs her oxygen with her last use 3 days ago.  Her sugars have been stable and she denies  hypoglycemia. She has an upcoming appointment with cardiology on 10/23/2017  Past Medical History:  Diagnosis Date  . CHF (congestive heart failure) ([place])   . CVA (cerebral vascular accident) ([place])   . Diabetes mellitus without complication ([place])   . Hypercholesteremia   . Hypertension   . Myocardial infarction ([place])   . Spinal stenosis    Allergies  Allergen Reactions  . Garlic Shortness Of Breath, Itching and Swelling    Hand itching and swelling  . Latex Itching  . Morphine And Related Itching and Other (See Comments)    Headache   . Other Itching    Reaction to newspaper ink -itching and headache    Current Outpatient Medications on File Prior to Visit  Medication Sig Dispense Refill  . Accu-Chek FastClix Lancets MISC USE AS DIRECTED TO TEST BLOOD SUGAR THREE TIMES DAILY 102 each 12  . allopurinol (ZYLOPRIM) 300 MG tablet Take 1 tablet (300 mg total) by mouth daily. 90 tablet 1  . aspirin EC 81 MG tablet Take 81 mg by mouth daily.    Marland Kitchen atorvastatin (LIPITOR) 80 MG tablet Take 1 tablet (80 mg total) by mouth daily at 6 PM for 30 days. 90 tablet 1  . Blood Glucose Monitoring Suppl (ACCU-CHEK GUIDE) w/Device KIT 1 each by Does not apply route 3 (three) times daily. 1 kit 0  . carvedilol (COREG) 12.5 MG tablet Take 1 tablet (12.5 mg total) by mouth 2 (two) times daily with a meal. 180 tablet 1  . DULoxetine (CYMBALTA) 60 MG capsule Take 1 capsule (60 mg total) by mouth daily. For chronic back and neck pain 30 capsule 3  . EASY COMFORT PEN NEEDLES 31G  X 5 MM MISC USE FOUR TIMES PER DAY FOR INSULIN ADMINISTRATION 200 each 12  . ergocalciferol (DRISDOL) 1.25 MG (50000 UT) capsule Take 1 capsule (50,000 Units total) by mouth once a week. 9 capsule 1  . furosemide (LASIX) 40 MG tablet Take 1 tablet (40 mg total) by mouth daily. 30 tablet 0  . glucose blood (ACCU-CHEK GUIDE) test strip USE AS DIRECTED TO TEST BLOOD SUGAR THREE TIMES DAILY 100 each 12  . insulin aspart (NOVOLOG) 100  UNIT/ML injection 0 to 12 units subcutaneously 3 times daily before meals as per sliding scale 30 mL 6  . Insulin Glargine (BASAGLAR KWIKPEN) 100 UNIT/ML SOPN Inject 0.05 mLs (5 Units total) into the skin at bedtime. 30 mL 6  . Lancet Device MISC Use as instructed 3 times daily 1 each 0  . Misc. Devices MISC Portable oxygen concentrator.  Diagnosis-chronic respiratory failure. 1 each 0  . omeprazole (PRILOSEC) 20 MG capsule Take 20 mg by mouth daily.    . ondansetron (ZOFRAN) 4 MG tablet Take 1 tablet (4 mg total) by mouth every 6 (six) hours as needed for nausea. 20 tablet 0  . polyethylene glycol (MIRALAX / GLYCOLAX) 17 g packet Take 17 g by mouth daily. 14 each 0  . SUPER B COMPLEX/C CAPS Take 1 capsule by mouth daily.    Marland Kitchen tiZANidine (ZANAFLEX) 4 MG tablet TAKE 1 TABLET (4 MG TOTAL) BY MOUTH EVERY 8 (EIGHT) HOURS AS NEEDED FOR MUSCLE SPASMS. 90 tablet 1  . amLODipine (NORVASC) 5 MG tablet TAKE 1 TABLET (5 MG TOTAL) BY MOUTH AT BEDTIME. 90 tablet 0  . gabapentin (NEURONTIN) 300 MG capsule TAKE 1 CAPSULE (300 MG TOTAL) BY MOUTH 2 (TWO) TIMES DAILY. 180 capsule 1   No current facility-administered medications on file prior to visit.     Observations/Objective: Awake, alert, oriented x3 Not in acute distress   Lab Results  Component Value Date   HGBA1C 7.2 (A) 09/17/2018    CMP Latest Ref Rng & Units 10/02/2018 10/01/2018 09/30/2018  Glucose 70 - 99 mg/dL 168(H) 136(H) 137(H)  BUN 8 - 23 mg/dL 64(H) 58(H) 49(H)  Creatinine 0.44 - 1.00 mg/dL 1.64(H) 1.59(H) 1.22(H)  Sodium 135 - 145 mmol/L 134(L) 133(L) 134(L)  Potassium 3.5 - 5.1 mmol/L 4.3 4.4 4.2  Chloride 98 - 111 mmol/L 88(L) 89(L) 88(L)  CO2 22 - 32 mmol/L 35(H) 34(H) 34(H)  Calcium 8.9 - 10.3 mg/dL 9.6 9.2 9.5  Total Protein 6.5 - 8.1 g/dL - - -  Total Bilirubin 0.3 - 1.2 mg/dL - - -  Alkaline Phos 38 - 126 U/L - - -  AST 15 - 41 U/L - - -  ALT 0 - 44 U/L - - -     Assessment and Plan: 1. Acute combined systolic and  diastolic congestive heart failure (HCC) 55% from 03/2018 Euvolemic Advised to cut back on Lasix dose as she is currently taking 60 mg twice a day and going forward will take 40 mg twice daily.  We will arrange for her to have repeat labs in the next 1 week and also at her cardiology visit given upward trending creatinine Advised to check daily weights-at her visit with cardiology she will be obtaining a scale Fluid restriction to 2 L/day; advised that weight gain of greater than 5 pounds per week is excessive and she needs to notify the clinic Counseled on low-sodium, heart healthy diet.  2. Type 2 diabetes mellitus with diabetic neuropathy, with long-term current  use of insulin ([place]) Controlled with A1c of 7.2 Continue current regimen Counseled on Diabetic diet, my plate method, 116 minutes of moderate intensity exercise/week Keep blood sugar logs with fasting goals of 80-120 mg/dl, random of less than 180 and in the event of sugars less than 60 mg/dl or greater than 400 mg/dl please notify the clinic ASAP. It is recommended that you undergo annual eye exams and annual foot exams. Pneumonia vaccine is recommended.   3. COVID-19 virus infection No acute illness from this No respiratory failure-does not need oxygen at this time Advised to remain in self quarantine for one more week   Follow Up Instructions: 3 months   I discussed the assessment and treatment plan with the patient. The patient was provided an opportunity to ask questions and all were answered. The patient agreed with the plan and demonstrated an understanding of the instructions.   The patient was advised to call back or seek an in-person evaluation if the symptoms worsen or if the condition fails to improve as anticipated.     I provided 19 minutes total of non-face-to-face time during this encounter including median intraservice time, reviewing previous notes, labs, imaging, medications, management and patient  verbalized understanding.     [name], MD, FAAFP. [facility] [place], [place]   10/08/2018, 10:45 AM

## 2018-10-09 ENCOUNTER — Telehealth: Payer: Self-pay

## 2018-10-09 NOTE — Telephone Encounter (Signed)
No new referral is needed at this time.  She has a visit with me in 6 days and I will revisit her need for labs then.

## 2018-10-09 NOTE — Telephone Encounter (Signed)
Call placed to [facility] to inquire if the home health nurse is able to draw blood.  Spoke to [place] who stated that they attempted to call the patient all last week and no calls were returned, so they closed the case.  They called the patient's phone , and # [phone] which is not listed in [place]. They did not have the phone number for her daughter, [name]. Jinny Blossom said that a new referral can be placed and they will review it.

## 2018-10-17 ENCOUNTER — Other Ambulatory Visit: Payer: Self-pay | Admitting: Family Medicine

## 2018-10-17 ENCOUNTER — Ambulatory Visit: Payer: Medicare Other | Admitting: Family Medicine

## 2018-10-17 ENCOUNTER — Telehealth: Payer: Self-pay

## 2018-10-17 DIAGNOSIS — I5041 Acute combined systolic (congestive) and diastolic (congestive) heart failure: Secondary | ICD-10-CM

## 2018-10-17 NOTE — Telephone Encounter (Signed)
At Dr Smitty Pluck request, call placed to patient and informed her that Dr Margarita Rana would like her to come to the clinic for [facility] tomorrow.  The patient stated that she will have her son drive her tomorrow and she could be at the clinic around 1100.  Informed her that the blood work would be drawn while she is in her car. She is not to come into the clinic.  She can call the clinic when she is in the parking lot and the nurse will meet her at her car.  She verbalized understanding.  She said that she when she was discharged from the hospital 10/02/2018, she was told to remain in isolation until 10/16/2018, so she is no longer in isolation.   She also noted that she wanted to let Dr Margarita Rana know that her " legs have been bothering " her.

## 2018-10-18 ENCOUNTER — Ambulatory Visit: Payer: Medicare Other

## 2018-10-18 NOTE — Telephone Encounter (Signed)
Call placed to patient's daughter, [name] and informed her that as per Dr Margarita Rana, her mother does not need to come to [facility] for labwork on 10/22/2018 as she has an appointment with cardiology at 1600 that afternoon.  Provided her with the cardiology appointment information and stressed the importance of patient keeping that appointment.  Also asked [name] to remind her mother to take cymbalta and gabapentin as ordered.  [name] said that she has not stopped taking them .  [name] also noted that her mother has difficulty walking distances and gets short of breath with exertion. She said that her legs become swollen and she  tends to lean to the side  She said that her mother  has a walker but she needs to walk behind her mother with a folding chair in the event that she needs to sit and rest.  She is requesting a power chair.  Informed her that this CM would notify Dr Margarita Rana but that is probably not the best option for her at this time.  A rollator may be more appropriate.

## 2018-10-18 NOTE — Telephone Encounter (Signed)
Attempted to contact patient # [phone] to make sure she is taking her cymbalta nad gabapentin and remind her to keep cardiology appointment 10/22/2018.  Message left requesting a call back to this CM # [phone]

## 2018-10-18 NOTE — Telephone Encounter (Signed)
Advise to keep appointment with Cardiology and please ensure she is taking her Cymbalta and Gabapentin. Thanks

## 2018-10-19 MED ORDER — MISC. DEVICES MISC: 0 refills | Status: DC

## 2018-10-19 NOTE — Telephone Encounter (Signed)
Prescription for rollator written. I will sign when I am in the office.

## 2018-10-22 ENCOUNTER — Ambulatory Visit (INDEPENDENT_AMBULATORY_CARE_PROVIDER_SITE_OTHER): Payer: Medicare Other | Admitting: Cardiology

## 2018-10-22 ENCOUNTER — Ambulatory Visit: Payer: Medicare Other

## 2018-10-22 ENCOUNTER — Other Ambulatory Visit: Payer: Self-pay

## 2018-10-22 ENCOUNTER — Telehealth: Payer: Self-pay | Admitting: Cardiology

## 2018-10-22 ENCOUNTER — Encounter: Payer: Self-pay | Admitting: Cardiology

## 2018-10-22 VITALS — BP 160/80 | HR 90 | Ht 59.0 in | Wt 164.0 lb

## 2018-10-22 DIAGNOSIS — I5181 Takotsubo syndrome: Secondary | ICD-10-CM | POA: Diagnosis not present

## 2018-10-22 DIAGNOSIS — I10 Essential (primary) hypertension: Secondary | ICD-10-CM | POA: Diagnosis not present

## 2018-10-22 DIAGNOSIS — E7800 Pure hypercholesterolemia, unspecified: Secondary | ICD-10-CM | POA: Diagnosis not present

## 2018-10-22 NOTE — Patient Instructions (Addendum)
Medication Instructions:  The current medical regimen is effective;  continue present plan and medications.  If you need a refill on your cardiac medications before your next appointment, please call your pharmacy.   Follow-Up: At [facility], you and your health needs are our priority.  As part of our continuing mission to provide you with exceptional heart care, we have created designated Provider Care Teams.  These Care Teams include your primary Cardiologist (physician) and Advanced Practice Providers (APPs -  Physician Assistants and Nurse Practitioners) who all work together to provide you with the care you need, when you need it. You will need a follow up appointment in 1 year.  Please call our office 2 months in advance to schedule this appointment.  You may see Candee Furbish, MD or one of the following Advanced Practice Providers on your designated Care Team:   Truitt Merle, NP Cecilie Kicks, NP . Kathyrn Drown, NP  Thank you for choosing [facility]!!

## 2018-10-22 NOTE — Progress Notes (Signed)
Cardiology Office Note:    Date:  10/22/2018   ID:  Alta Goding, DOB [birth year], MRN [id number]  PCP:  [name], MD  Cardiologist:  Candee Furbish, MD  Electrophysiologist:  None   Referring MD: [name], MD   No chief complaint on file. Follow-up cardiomyopathy  History of Present Illness:    Kara Hanson is a 63 y.o. female with cardiomyopathy, previous 30 to 35% with normal coronary arteries on cath and EF of 50% subsequently here for follow-up.  Thought that this was perhaps a stress-induced cardiomyopathy  Subsequent echo December 20 19-50%.  He did have mild aortic stenosis with a mean gradient of 10 mmHg on echocardiogram.  Murmur heard on exam.  Her EKG did demonstrate T wave inversion inferior and laterally new.This sometimes is classic for stress induced cardiomyopathy.  Nonsmoker, no alcohol, no drug use  Prior to this catheterization she had had a catheterization performed in [place] with no stents placed.  She has had vertigo in the past.  Spinal stenosis does limit her walking.  Past Medical History:  Diagnosis Date  . CHF (congestive heart failure) ([place])   . CVA (cerebral vascular accident) ([place])   . Diabetes mellitus without complication ([place])   . Hypercholesteremia   . Hypertension   . Myocardial infarction ([place])   . Spinal stenosis     Past Surgical History:  Procedure Laterality Date  . BLADDER SURGERY    . CARDIAC CATHETERIZATION N/A 04/25/2016   Procedure: Left Heart Cath and Coronary Angiography;  Surgeon: Lorretta Harp, MD;  Location: [place] [facility];  Service: Cardiovascular;  Laterality: N/A;  . CESAREAN SECTION    . CHOLECYSTECTOMY      Current Medications: Current Meds  Medication Sig  . Accu-Chek FastClix Lancets MISC USE AS DIRECTED TO TEST BLOOD SUGAR THREE TIMES DAILY  . allopurinol (ZYLOPRIM) 300 MG tablet Take 1 tablet (300 mg total) by mouth daily.  Marland Kitchen amLODipine (NORVASC) 5 MG tablet TAKE 1 TABLET (5  MG TOTAL) BY MOUTH AT BEDTIME.  Marland Kitchen aspirin EC 81 MG tablet Take 81 mg by mouth daily.  Marland Kitchen atorvastatin (LIPITOR) 80 MG tablet Take 1 tablet (80 mg total) by mouth daily at 6 PM for 30 days.  . Blood Glucose Monitoring Suppl (ACCU-CHEK GUIDE) w/Device KIT 1 each by Does not apply route 3 (three) times daily.  . carvedilol (COREG) 12.5 MG tablet Take 1 tablet (12.5 mg total) by mouth 2 (two) times daily with a meal.  . DULoxetine (CYMBALTA) 60 MG capsule Take 1 capsule (60 mg total) by mouth daily. For chronic back and neck pain  . EASY COMFORT PEN NEEDLES 31G X 5 MM MISC USE FOUR TIMES PER DAY FOR INSULIN ADMINISTRATION  . ergocalciferol (DRISDOL) 1.25 MG (50000 UT) capsule Take 1 capsule (50,000 Units total) by mouth once a week.  . furosemide (LASIX) 40 MG tablet Take 1 tablet (40 mg total) by mouth daily.  Marland Kitchen gabapentin (NEURONTIN) 300 MG capsule TAKE 1 CAPSULE (300 MG TOTAL) BY MOUTH 2 (TWO) TIMES DAILY.  Marland Kitchen glucose blood (ACCU-CHEK GUIDE) test strip USE AS DIRECTED TO TEST BLOOD SUGAR THREE TIMES DAILY  . insulin aspart (NOVOLOG) 100 UNIT/ML injection 0 to 12 units subcutaneously 3 times daily before meals as per sliding scale  . Insulin Glargine (BASAGLAR KWIKPEN) 100 UNIT/ML SOPN Inject 0.05 mLs (5 Units total) into the skin at bedtime.  Elmore Guise Device MISC Use as instructed 3 times daily  . Misc. Devices  MISC Portable oxygen concentrator.  Diagnosis-chronic respiratory failure.  . Misc. Devices MISC Rollaor with seat. Dx: Congestive Heart Failure  . omeprazole (PRILOSEC) 20 MG capsule Take 20 mg by mouth daily.  . ondansetron (ZOFRAN) 4 MG tablet Take 1 tablet (4 mg total) by mouth every 6 (six) hours as needed for nausea.  . polyethylene glycol (MIRALAX / GLYCOLAX) 17 g packet Take 17 g by mouth daily.  . SUPER B COMPLEX/C CAPS Take 1 capsule by mouth daily.  Marland Kitchen tiZANidine (ZANAFLEX) 4 MG tablet TAKE 1 TABLET (4 MG TOTAL) BY MOUTH EVERY 8 (EIGHT) HOURS AS NEEDED FOR MUSCLE SPASMS.      Allergies:   Garlic, Latex, Morphine and related, and Other   Social History   Socioeconomic History  . Marital status: Widowed    Spouse name: Not on file  . Number of children: Not on file  . Years of education: Not on file  . Highest education level: Not on file  Occupational History  . Not on file  Social Needs  . Financial resource strain: Not on file  . Food insecurity    Worry: Not on file    Inability: Not on file  . Transportation needs    Medical: Not on file    Non-medical: Not on file  Tobacco Use  . Smoking status: Never Smoker  . Smokeless tobacco: Never Used  Substance and Sexual Activity  . Alcohol use: No  . Drug use: No  . Sexual activity: Not Currently    Birth control/protection: None  Lifestyle  . Physical activity    Days per week: Not on file    Minutes per session: Not on file  . Stress: Not on file  Relationships  . Social Herbalist on phone: Not on file    Gets together: Not on file    Attends religious service: Not on file    Active member of club or organization: Not on file    Attends meetings of clubs or organizations: Not on file    Relationship status: Not on file  Other Topics Concern  . Not on file  Social History Narrative  . Not on file     Family History: The patient's family history includes Diabetes Mellitus II in her father; Stroke in her father.  ROS:   Please see the history of present illness.    No fevers chills nausea vomiting syncope bleeding all other systems reviewed and are negative.  EKGs/Labs/Other Studies Reviewed:    The following studies were reviewed today: ECHO 2019 Study Conclusions  - Left ventricle: The cavity size was normal. Wall thickness was   increased in a pattern of mild LVH. Systolic function was normal.   The estimated ejection fraction was in the range of 55% to 60%.   Wall motion was normal; there were no regional wall motion   abnormalities. Doppler parameters are  consistent with abnormal   left ventricular relaxation (grade 1 diastolic dysfunction). The   E/e&' ratio is >15, suggesting elevated LV filling pressure. - Aortic valve: Poorly visualized. Mildly calcified leaflets. Mild   stenosis. Mean gradient (S): 10 mm Hg. Peak gradient (S): 16 mm   Hg. Valve area (VTI): 1.46 cm^2. Valve area (Vmax): 1.34 cm^2.   Valve area (Vmean): 1.4 cm^2. - Mitral valve: Mildly thickened leaflets . There was trivial   regurgitation. - Left atrium: The atrium was normal in size. - Inferior vena cava: The vessel was normal in size.  The   respirophasic diameter changes were in the normal range (>= 50%),   consistent with normal central venous pressure.  Impressions:  - Compared to a prior study in 06/2016, the LVEF is unchanged. There   is diastolic dysfunction with elevated LV filling pressure. There   is likely mild aortic stenosis with mean gradient of 10 mmHg.  - Left ventricle: The cavity size was normal. Systolic function was   at the lower limits of normal. The estimated ejection fraction   was in the range of 50% to 55%. Wall motion was normal; there   were no regional wall motion abnormalities. Features are   consistent with a pseudonormal left ventricular filling pattern,   with concomitant abnormal relaxation and increased filling   pressure (grade 2 diastolic dysfunction).  EKG: 09/24/2018-sinus rhythm with T wave inversion inferolaterally  Recent Labs: 07/25/2018: ALT 11 09/17/2018: Magnesium 1.8 09/24/2018: B Natriuretic Peptide 434.7 09/29/2018: TSH 2.559 10/02/2018: BUN 64; Creatinine, Ser 1.64; Hemoglobin 10.5; Platelets 283; Potassium 4.3; Sodium 134  Recent Lipid Panel    Component Value Date/Time   CHOL 295 (H) 03/24/2018 0551   TRIG 137 07/20/2018 1803   HDL 45 03/24/2018 0551   CHOLHDL 6.6 03/24/2018 0551   VLDL 35 03/24/2018 0551   LDLCALC 215 (H) 03/24/2018 0551    Physical Exam:    VS:  BP (!) 160/80   Pulse 90   Ht 4'  11" (1.499 m)   Wt 164 lb (74.4 kg)   SpO2 (!) 85%   BMI 33.12 kg/m     Wt Readings from Last 3 Encounters:  10/22/18 164 lb (74.4 kg)  10/02/18 156 lb 8.4 oz (71 kg)  09/17/18 157 lb 12.8 oz (71.6 kg)     GEN:  Well nourished, well developed in no acute distress HEENT: Normal NECK: No JVD; No carotid bruits LYMPHATICS: No lymphadenopathy CARDIAC: RRR, 1/6 SM,no rubs, gallops RESPIRATORY:  Clear to auscultation without rales, wheezing or rhonchi  ABDOMEN: Soft, non-tender, non-distended MUSCULOSKELETAL:  No edema; No deformity  SKIN: Warm and dry NEUROLOGIC:  Alert and oriented x 3 PSYCHIATRIC:  Normal affect   ASSESSMENT:    1. Takotsubo cardiomyopathy   2. Essential hypertension   3. Pure hypercholesterolemia    PLAN:    In order of problems listed above:  Mild aortic stenosis -Mean gradient 10 mmHg.  Continue to monitor clinically.  No need to repeat echocardiogram at this time.  Prior stress-induced cardiomyopathy - Normal coronary arteries.  Return to normal of ejection fraction.  Excellent.  Continue with current regimen including carvedilol.   Essential hypertension - Continue with current blood pressure regimen.  Slightly elevated today in clinic usually at home she is better.   Medication Adjustments/Labs and Tests Ordered: Current medicines are reviewed at length with the patient today.  Concerns regarding medicines are outlined above.  No orders of the defined types were placed in this encounter.  No orders of the defined types were placed in this encounter.   Patient Instructions  Medication Instructions:  The current medical regimen is effective;  continue present plan and medications.  If you need a refill on your cardiac medications before your next appointment, please call your pharmacy.   Follow-Up: At [facility], you and your health needs are our priority.  As part of our continuing mission to provide you with exceptional heart care, we  have created designated Provider Care Teams.  These Care Teams include your primary Cardiologist (physician) and Advanced  Practice Providers (APPs -  Physician Assistants and Nurse Practitioners) who all work together to provide you with the care you need, when you need it. You will need a follow up appointment in 1 year.  Please call our office 2 months in advance to schedule this appointment.  You may see Candee Furbish, MD or one of the following Advanced Practice Providers on your designated Care Team:   Truitt Merle, NP Cecilie Kicks, NP . Kathyrn Drown, NP  Thank you for choosing [facility]!!         Signed, Candee Furbish, MD  10/22/2018 4:48 PM    [place]

## 2018-10-22 NOTE — Telephone Encounter (Signed)

## 2018-10-31 ENCOUNTER — Other Ambulatory Visit: Payer: Self-pay

## 2018-10-31 ENCOUNTER — Emergency Department (HOSPITAL_COMMUNITY): Payer: Medicare Other

## 2018-10-31 ENCOUNTER — Encounter (HOSPITAL_COMMUNITY): Payer: Self-pay

## 2018-10-31 ENCOUNTER — Inpatient Hospital Stay (HOSPITAL_COMMUNITY)
Admission: EM | Admit: 2018-10-31 | Discharge: 2018-11-05 | DRG: 193 | Disposition: A | Discharge status: Home / Self Care | Payer: Medicare Other | Attending: Family Medicine | Admitting: Family Medicine

## 2018-10-31 DIAGNOSIS — Z885 Allergy status to narcotic agent status: Secondary | ICD-10-CM

## 2018-10-31 DIAGNOSIS — Z7982 Long term (current) use of aspirin: Secondary | ICD-10-CM | POA: Diagnosis not present

## 2018-10-31 DIAGNOSIS — R0602 Shortness of breath: Secondary | ICD-10-CM | POA: Diagnosis not present

## 2018-10-31 DIAGNOSIS — M4800 Spinal stenosis, site unspecified: Secondary | ICD-10-CM | POA: Diagnosis present

## 2018-10-31 DIAGNOSIS — Z23 Encounter for immunization: Secondary | ICD-10-CM | POA: Diagnosis not present

## 2018-10-31 DIAGNOSIS — Z823 Family history of stroke: Secondary | ICD-10-CM

## 2018-10-31 DIAGNOSIS — R0600 Dyspnea, unspecified: Secondary | ICD-10-CM | POA: Diagnosis not present

## 2018-10-31 DIAGNOSIS — Z9109 Other allergy status, other than to drugs and biological substances: Secondary | ICD-10-CM | POA: Diagnosis not present

## 2018-10-31 DIAGNOSIS — Z8619 Personal history of other infectious and parasitic diseases: Secondary | ICD-10-CM | POA: Diagnosis not present

## 2018-10-31 DIAGNOSIS — Z91040 Latex allergy status: Secondary | ICD-10-CM | POA: Diagnosis not present

## 2018-10-31 DIAGNOSIS — I130 Hypertensive heart and chronic kidney disease with heart failure and stage 1 through stage 4 chronic kidney disease, or unspecified chronic kidney disease: Secondary | ICD-10-CM | POA: Diagnosis present

## 2018-10-31 DIAGNOSIS — I340 Nonrheumatic mitral (valve) insufficiency: Secondary | ICD-10-CM | POA: Diagnosis not present

## 2018-10-31 DIAGNOSIS — N183 Chronic kidney disease, stage 3 unspecified: Secondary | ICD-10-CM | POA: Diagnosis present

## 2018-10-31 DIAGNOSIS — J189 Pneumonia, unspecified organism: Secondary | ICD-10-CM | POA: Diagnosis not present

## 2018-10-31 DIAGNOSIS — Z20828 Contact with and (suspected) exposure to other viral communicable diseases: Secondary | ICD-10-CM | POA: Diagnosis present

## 2018-10-31 DIAGNOSIS — Z8673 Personal history of transient ischemic attack (TIA), and cerebral infarction without residual deficits: Secondary | ICD-10-CM | POA: Diagnosis not present

## 2018-10-31 DIAGNOSIS — D631 Anemia in chronic kidney disease: Secondary | ICD-10-CM | POA: Diagnosis present

## 2018-10-31 DIAGNOSIS — Z833 Family history of diabetes mellitus: Secondary | ICD-10-CM | POA: Diagnosis not present

## 2018-10-31 DIAGNOSIS — I5043 Acute on chronic combined systolic (congestive) and diastolic (congestive) heart failure: Secondary | ICD-10-CM | POA: Diagnosis present

## 2018-10-31 DIAGNOSIS — I5033 Acute on chronic diastolic (congestive) heart failure: Secondary | ICD-10-CM | POA: Diagnosis present

## 2018-10-31 DIAGNOSIS — I10 Essential (primary) hypertension: Secondary | ICD-10-CM | POA: Diagnosis not present

## 2018-10-31 DIAGNOSIS — Z794 Long term (current) use of insulin: Secondary | ICD-10-CM | POA: Diagnosis not present

## 2018-10-31 DIAGNOSIS — J9811 Atelectasis: Secondary | ICD-10-CM | POA: Diagnosis not present

## 2018-10-31 DIAGNOSIS — I352 Nonrheumatic aortic (valve) stenosis with insufficiency: Secondary | ICD-10-CM | POA: Diagnosis present

## 2018-10-31 DIAGNOSIS — E785 Hyperlipidemia, unspecified: Secondary | ICD-10-CM | POA: Diagnosis present

## 2018-10-31 DIAGNOSIS — R0902 Hypoxemia: Secondary | ICD-10-CM | POA: Diagnosis not present

## 2018-10-31 DIAGNOSIS — Z91018 Allergy to other foods: Secondary | ICD-10-CM | POA: Diagnosis not present

## 2018-10-31 DIAGNOSIS — R609 Edema, unspecified: Secondary | ICD-10-CM | POA: Diagnosis not present

## 2018-10-31 DIAGNOSIS — E7800 Pure hypercholesterolemia, unspecified: Secondary | ICD-10-CM | POA: Diagnosis not present

## 2018-10-31 DIAGNOSIS — I2510 Atherosclerotic heart disease of native coronary artery without angina pectoris: Secondary | ICD-10-CM | POA: Diagnosis present

## 2018-10-31 DIAGNOSIS — Z79899 Other long term (current) drug therapy: Secondary | ICD-10-CM

## 2018-10-31 DIAGNOSIS — I252 Old myocardial infarction: Secondary | ICD-10-CM | POA: Diagnosis not present

## 2018-10-31 DIAGNOSIS — E1122 Type 2 diabetes mellitus with diabetic chronic kidney disease: Secondary | ICD-10-CM | POA: Diagnosis present

## 2018-10-31 DIAGNOSIS — Z209 Contact with and (suspected) exposure to unspecified communicable disease: Secondary | ICD-10-CM | POA: Diagnosis not present

## 2018-10-31 DIAGNOSIS — J9621 Acute and chronic respiratory failure with hypoxia: Secondary | ICD-10-CM | POA: Diagnosis present

## 2018-10-31 DIAGNOSIS — E1140 Type 2 diabetes mellitus with diabetic neuropathy, unspecified: Secondary | ICD-10-CM | POA: Diagnosis present

## 2018-10-31 DIAGNOSIS — J181 Lobar pneumonia, unspecified organism: Secondary | ICD-10-CM | POA: Diagnosis not present

## 2018-10-31 HISTORY — DX: Pneumonia, unspecified organism: J18.9

## 2018-10-31 LAB — BRAIN NATRIURETIC PEPTIDE: B Natriuretic Peptide: 422.8 pg/mL — ABNORMAL HIGH (ref 0.0–100.0)

## 2018-10-31 LAB — CBC
HCT: 35.4 % — ABNORMAL LOW (ref 36.0–46.0)
Hemoglobin: 10.9 g/dL — ABNORMAL LOW (ref 12.0–15.0)
MCH: 27 pg (ref 26.0–34.0)
MCHC: 30.8 g/dL (ref 30.0–36.0)
MCV: 87.6 fL (ref 80.0–100.0)
Platelets: 260 10*3/uL (ref 150–400)
RBC: 4.04 MIL/uL (ref 3.87–5.11)
RDW: 16 % — ABNORMAL HIGH (ref 11.5–15.5)
WBC: 7.6 10*3/uL (ref 4.0–10.5)
nRBC: 0 % (ref 0.0–0.2)

## 2018-10-31 LAB — CBC WITH DIFFERENTIAL/PLATELET
Abs Immature Granulocytes: 0.06 10*3/uL (ref 0.00–0.07)
Basophils Absolute: 0 10*3/uL (ref 0.0–0.1)
Basophils Relative: 0 %
Eosinophils Absolute: 0.1 10*3/uL (ref 0.0–0.5)
Eosinophils Relative: 1 %
HCT: 35.5 % — ABNORMAL LOW (ref 36.0–46.0)
Hemoglobin: 10.9 g/dL — ABNORMAL LOW (ref 12.0–15.0)
Immature Granulocytes: 1 %
Lymphocytes Relative: 8 %
Lymphs Abs: 0.6 10*3/uL — ABNORMAL LOW (ref 0.7–4.0)
MCH: 27.3 pg (ref 26.0–34.0)
MCHC: 30.7 g/dL (ref 30.0–36.0)
MCV: 89 fL (ref 80.0–100.0)
Monocytes Absolute: 0.3 10*3/uL (ref 0.1–1.0)
Monocytes Relative: 4 %
Neutro Abs: 6.5 10*3/uL (ref 1.7–7.7)
Neutrophils Relative %: 86 %
Platelets: 259 10*3/uL (ref 150–400)
RBC: 3.99 MIL/uL (ref 3.87–5.11)
RDW: 16.1 % — ABNORMAL HIGH (ref 11.5–15.5)
WBC: 7.5 10*3/uL (ref 4.0–10.5)
nRBC: 0.3 % — ABNORMAL HIGH (ref 0.0–0.2)

## 2018-10-31 LAB — FIBRINOGEN: Fibrinogen: 731 mg/dL — ABNORMAL HIGH (ref 210–475)

## 2018-10-31 LAB — COMPREHENSIVE METABOLIC PANEL
ALT: 16 U/L (ref 0–44)
AST: 14 U/L — ABNORMAL LOW (ref 15–41)
Albumin: 3 g/dL — ABNORMAL LOW (ref 3.5–5.0)
Alkaline Phosphatase: 69 U/L (ref 38–126)
Anion gap: 10 (ref 5–15)
BUN: 38 mg/dL — ABNORMAL HIGH (ref 8–23)
CO2: 23 mmol/L (ref 22–32)
Calcium: 9.4 mg/dL (ref 8.9–10.3)
Chloride: 106 mmol/L (ref 98–111)
Creatinine, Ser: 1.34 mg/dL — ABNORMAL HIGH (ref 0.44–1.00)
GFR calc Af Amer: 49 mL/min — ABNORMAL LOW (ref >60)
GFR calc non Af Amer: 42 mL/min — ABNORMAL LOW (ref >60)
Glucose, Bld: 208 mg/dL — ABNORMAL HIGH (ref 70–99)
Potassium: 3.8 mmol/L (ref 3.5–5.1)
Sodium: 139 mmol/L (ref 135–145)
Total Bilirubin: 0.5 mg/dL (ref 0.3–1.2)
Total Protein: 6.2 g/dL — ABNORMAL LOW (ref 6.5–8.1)

## 2018-10-31 LAB — SARS CORONAVIRUS 2 BY RT PCR (HOSPITAL ORDER, PERFORMED IN ~~LOC~~ HOSPITAL LAB): SARS Coronavirus 2: NEGATIVE

## 2018-10-31 LAB — TROPONIN I (HIGH SENSITIVITY)
Troponin I (High Sensitivity): 12 ng/L (ref <18)
Troponin I (High Sensitivity): 12 ng/L (ref <18)

## 2018-10-31 LAB — POCT I-STAT EG7
Acid-Base Excess: 1 mmol/L (ref 0.0–2.0)
Bicarbonate: 27.4 mmol/L (ref 20.0–28.0)
Calcium, Ion: 1.26 mmol/L (ref 1.15–1.40)
HCT: 40 % (ref 36.0–46.0)
Hemoglobin: 13.6 g/dL (ref 12.0–15.0)
O2 Saturation: 84 %
Potassium: 4 mmol/L (ref 3.5–5.1)
Sodium: 138 mmol/L (ref 135–145)
TCO2: 29 mmol/L (ref 22–32)
pCO2, Ven: 51.9 mmHg (ref 44.0–60.0)
pH, Ven: 7.331 (ref 7.250–7.430)
pO2, Ven: 53 mmHg — ABNORMAL HIGH (ref 32.0–45.0)

## 2018-10-31 LAB — C-REACTIVE PROTEIN: CRP: <0.8 mg/dL (ref <1.0)

## 2018-10-31 LAB — PROTIME-INR
INR: 1.1 (ref 0.8–1.2)
Prothrombin Time: 13.6 seconds (ref 11.4–15.2)

## 2018-10-31 LAB — CBG MONITORING, ED: Glucose-Capillary: 212 mg/dL — ABNORMAL HIGH (ref 70–99)

## 2018-10-31 LAB — LACTATE DEHYDROGENASE: LDH: 204 U/L — ABNORMAL HIGH (ref 98–192)

## 2018-10-31 LAB — FERRITIN: Ferritin: 77 ng/mL (ref 11–307)

## 2018-10-31 MED ORDER — LABETALOL HCL 5 MG/ML IV SOLN
20.0000 mg | Freq: Once | INTRAVENOUS | Status: AC
  Administered 2018-10-31: 21:00:00 20 mg via INTRAVENOUS
  Filled 2018-10-31: qty 4

## 2018-10-31 MED ORDER — SODIUM CHLORIDE 0.9 % IV SOLN
1.0000 g | Freq: Once | INTRAVENOUS | Status: AC
  Administered 2018-10-31: 18:00:00 1 g via INTRAVENOUS
  Filled 2018-10-31: qty 10

## 2018-10-31 MED ORDER — LABETALOL HCL 5 MG/ML IV SOLN
20.0000 mg | Freq: Once | INTRAVENOUS | Status: AC
  Administered 2018-10-31: 20 mg via INTRAVENOUS
  Filled 2018-10-31: qty 4

## 2018-10-31 MED ORDER — SODIUM CHLORIDE 0.9 % IV SOLN
500.0000 mg | Freq: Once | INTRAVENOUS | Status: AC
  Administered 2018-10-31: 500 mg via INTRAVENOUS
  Filled 2018-10-31: qty 500

## 2018-10-31 MED ORDER — FUROSEMIDE 10 MG/ML IJ SOLN
60.0000 mg | Freq: Once | INTRAMUSCULAR | Status: AC
  Administered 2018-10-31: 16:00:00 60 mg via INTRAVENOUS
  Filled 2018-10-31: qty 6

## 2018-10-31 NOTE — ED Triage Notes (Signed)
Pt arrives with [facility] from home c/o increased shortness of breath over the past few days. Pt has CHF and HTN. Pt reports a decrease in urine output and has increased her lasix from 40 mg to 80 mg and had "only a time bit" of urine output. Pt normal wears 3L O2 at home and was placed on a non-rebreather by EMS at 15 L. Pt was 91% on 3L with EMS.  EMS vitals:  160/75 80 HR 18 RR 187 CBG Temp 97.5

## 2018-10-31 NOTE — H&P (Signed)
History and Physical    Kara Hanson IOX:[id number] DOB: [birth year] DOA: 10/31/2018  PCP: [name], MD  Patient coming from: Home  Chief Complaint: SOB  HPI: Kara Hanson is a 63 y.o. female with medical history significant of CAD, spinal stenosis, HTN, HLD, IDDM type 2, CVA, HFpEF (EF of 50-55% last December) who presents for worsening SOB.  She notes that the symptoms started 2 days ago and then became unbearable today.  She had increased respiratory rate and had to increase her oxygen use.  She has had this happen before, so over the last couple of days, she increased her lasix from 79m daily to 869mdaily, but her UOP stayed low.  She has had no interest in eating and for the last day has had a cough  She reports staying to a low salt diet. [facility]  She had a headache on admission, but this has improved.  She further reports swelling of her LE which has gotten worse.  She denies fever, sputum production, chest pain, change in diet, dysuria, abdominal pain, nausea, vomiting, diarrhea.  She has been taking all of her medications.    She was admitted last month for an acute heart failure exacerbation.  She was noted to be COVID-19 positive at that time, but felt to be Asymptomatic.  She had further been admitted in April of this year with COVID-19 pneumonia [facility].  She did well with diuresis during both hospital stays.  Today, her COVID-19 test was negative.  Her CXR showed a left lower consolidation and pleural effusion with pulmonary vascular congestion as well.  She tells me that she has a long history of frequent pneumonias since she was a child and that her last one was 1 month ago.    ED Course: In the ED, she was treated with lasix and she reports good urine output.  Labs revealed a normal WBC, with a mildly low H/H which are stable.  Renal function of 38/1.34 (baseline Cr 1.1-1.3), glucose 208.  VBG showed a normal  pH with a high O2.  She is now on 4LNC.  Review of Systems: As per HPI otherwise 10 point review of systems negative.   Past Medical History:  Diagnosis Date  . [facility] [place] [facility]  . [place]  . Diabetes mellitus without complication ([facility]  . Hypercholesteremia   . Hypertension   . [place] [place]  . Spinal stenosis     Past Surgical History:  Procedure Laterality Date  . BLADDER SURGERY    . CARDIAC CATHETERIZATION N/A 04/25/2016   Procedure: Left Heart Cath and Coronary Angiography;  Surgeon: JoLorretta HarpMD;  [facility];  Service: Cardiovascular;  Laterality: N/A;  . CESAREAN SECTION    . CHOLECYSTECTOMY     Reviewed with patient, she is a never smoker.   reports that she has never smoked. She has never used smokeless tobacco. She reports that she does not drink alcohol or use drugs.  Allergies  Allergen Reactions  . Garlic Shortness Of Breath, Itching and Swelling    Hand itching and swelling  . Latex Itching  . Morphine And Related Itching and Other (See Comments)    Headache   . Other Itching    Reaction to newspaper ink -itching and headache    Family History  Problem Relation Age of  Onset  . Diabetes Mellitus II Father   . Stroke Father   . Healthy Mother        She is 36 years old.     Prior to Admission medications   Medication Sig Start Date End Date Taking? Authorizing Provider  Accu-Chek FastClix Lancets MISC USE AS DIRECTED TO TEST BLOOD SUGAR THREE TIMES DAILY 07/31/18   [name], MD  allopurinol (ZYLOPRIM) 300 MG tablet Take 1 tablet (300 mg total) by mouth daily. 09/17/18   [name], MD  amLODipine (NORVASC) 5 MG tablet TAKE 1 TABLET (5 MG TOTAL) BY MOUTH AT BEDTIME. 09/01/18 10/22/18  [name], MD  aspirin EC 81 MG tablet Take 81 mg by mouth daily.    [provider]  atorvastatin (LIPITOR) 80 MG tablet Take 1 tablet (80 mg total) by  mouth daily at 6 PM for 30 days. 09/17/18 10/31/18  [name], MD  Blood Glucose Monitoring Suppl (ACCU-CHEK GUIDE) w/Device KIT 1 each by Does not apply route 3 (three) times daily. 07/31/18   [name], MD  carvedilol (COREG) 12.5 MG tablet Take 1 tablet (12.5 mg total) by mouth 2 (two) times daily with a meal. 09/17/18   [name], MD  DULoxetine (CYMBALTA) 60 MG capsule Take 1 capsule (60 mg total) by mouth daily. For chronic back and neck pain 08/07/18   [name], MD  EASY COMFORT PEN NEEDLES 31G X 5 MM MISC USE FOUR TIMES PER DAY FOR INSULIN ADMINISTRATION Patient taking differently: 4 (four) times daily.  10/02/18   [name], MD  ergocalciferol (DRISDOL) 1.25 MG (50000 UT) capsule Take 1 capsule (50,000 Units total) by mouth once a week. 09/18/18   [name], MD  furosemide (LASIX) 40 MG tablet Take 1 tablet (40 mg total) by mouth daily. 10/03/18   Bonnielee Haff, MD  gabapentin (NEURONTIN) 300 MG capsule TAKE 1 CAPSULE (300 MG TOTAL) BY MOUTH 2 (TWO) TIMES DAILY. 09/03/18 10/31/18  [name], MD  glucose blood (ACCU-CHEK GUIDE) test strip USE AS DIRECTED TO TEST BLOOD SUGAR THREE TIMES DAILY 07/31/18   [name], MD  insulin aspart (NOVOLOG) 100 UNIT/ML injection 0 to 12 units subcutaneously 3 times daily before meals as per sliding scale 03/29/18   [name], MD  Insulin Glargine (BASAGLAR KWIKPEN) 100 UNIT/ML SOPN Inject 0.05 mLs (5 Units total) into the skin at bedtime. 10/02/18   Bonnielee Haff, MD  Lancet Device MISC Use as instructed 3 times daily 09/19/17   Renita Papa, PA-C  Misc. Devices MISC Portable oxygen concentrator.  Diagnosis-chronic respiratory failure. 09/03/18   [name], MD  Misc. Devices MISC Rollaor with seat. Dx: Congestive Heart Failure 10/19/18   [name], MD  omeprazole (PRILOSEC) 20 MG capsule Take 20 mg by mouth daily.    [provider]  ondansetron (ZOFRAN) 4 MG tablet Take 1 tablet (4 mg total) by  mouth every 6 (six) hours as needed for nausea. 07/25/18   Charlynne Cousins, MD  polyethylene glycol (MIRALAX / GLYCOLAX) 17 g packet Take 17 g by mouth daily. 10/02/18   Bonnielee Haff, MD  SUPER B COMPLEX/C CAPS Take 1 capsule by mouth daily.    [provider]  tiZANidine (ZANAFLEX) 4 MG tablet TAKE 1 TABLET (4 MG TOTAL) BY MOUTH EVERY 8 (EIGHT) HOURS AS NEEDED FOR MUSCLE SPASMS. 09/01/18   [name], MD    Physical Exam: Constitutional: Sitting straight up in bed, Tachypneic Vitals:   10/31/18 1945 10/31/18 2000  10/31/18 2015 10/31/18 2030  BP: (!) 166/85 (!) 176/91 (!) 187/98 (!) 160/128  Pulse: 83 81 81 85  Resp: (!) 33 (!) 21 (!) 23 20  Temp:      TempSrc:      SpO2: 93% 96% 95% 92%  Weight:      Height:       Eyes:  lids and conjunctivae normal, no icterus ENMT: MMM, JVD not appreciated given patients positioning Neck: normal, supple Respiratory: Course breath sounds throughout, mild expiratory wheezing throughout, decreased breath sounds at bases, worse on the left.   Cardiovascular: RR, difficult to appreciate sounds given upper airway sounds.  She has 1-2+ pitting edema to mid calf, she has palpable radial and DP pulses bilaterally.   Abdomen: +BS, NT, ND Musculoskeletal: no clubbing / cyanosis. Normal muscle tone.  Skin: no rashes, lesions, ulcers. She has a dime sized macule on chest which is old.  Neurologic: Grossly intact, moving all extremities, sensation intact to light touch.  Psychiatric: Normal judgment and insight.   Labs on Admission: I have personally reviewed following labs and imaging studies  CBC: Recent Labs  Lab 10/31/18 1530 10/31/18 1825 10/31/18 2030  WBC 7.6  --  7.5  NEUTROABS  --   --  6.5  HGB 10.9* 13.6 10.9*  HCT 35.4* 40.0 35.5*  MCV 87.6  --  89.0  PLT 260  --  814   Basic Metabolic Panel: Recent Labs  Lab 10/31/18 1530 10/31/18 1825  NA 139 138  K 3.8 4.0  CL 106  --   CO2 23  --   GLUCOSE 208*  --   BUN  38*  --   CREATININE 1.34*  --   CALCIUM 9.4  --    GFR: Estimated Creatinine Clearance: 37.3 mL/min (A) (by C-G formula based on SCr of 1.34 mg/dL (H)). Liver Function Tests: Recent Labs  Lab 10/31/18 1530  AST 14*  ALT 16  ALKPHOS 69  BILITOT 0.5  PROT 6.2*  ALBUMIN 3.0*   No results for input(s): LIPASE, AMYLASE in the last 168 hours. No results for input(s): AMMONIA in the last 168 hours. Coagulation Profile: No results for input(s): INR, PROTIME in the last 168 hours. Cardiac Enzymes: No results for input(s): CKTOTAL, CKMB, CKMBINDEX, TROPONINI in the last 168 hours. BNP (last 3 results) No results for input(s): PROBNP in the last 8760 hours. HbA1C: No results for input(s): HGBA1C in the last 72 hours. CBG: Recent Labs  Lab 10/31/18 1620  GLUCAP 212*   Lipid Profile: No results for input(s): CHOL, HDL, LDLCALC, TRIG, CHOLHDL, LDLDIRECT in the last 72 hours. Thyroid Function Tests: No results for input(s): TSH, T4TOTAL, FREET4, T3FREE, THYROIDAB in the last 72 hours. Anemia Panel: No results for input(s): VITAMINB12, FOLATE, FERRITIN, TIBC, IRON, RETICCTPCT in the last 72 hours. Urine analysis:    Component Value Date/Time   COLORURINE YELLOW 01/02/2018 1452   APPEARANCEUR HAZY (A) 01/02/2018 1452   LABSPEC 1.039 (H) 01/02/2018 1452   PHURINE 6.0 01/02/2018 1452   GLUCOSEU >=500 (A) 01/02/2018 1452   HGBUR NEGATIVE 01/02/2018 1452   BILIRUBINUR NEGATIVE 01/02/2018 1452   BILIRUBINUR negative 12/13/2017 [place] 01/02/2018 1452   PROTEINUR >=300 (A) 01/02/2018 1452   UROBILINOGEN 0.2 12/13/2017 1514   NITRITE NEGATIVE 01/02/2018 1452   LEUKOCYTESUR NEGATIVE 01/02/2018 1452    Radiological Exams on Admission: Dg Chest Port 1 View  Result Date: 10/31/2018 CLINICAL DATA:  Shortness of breath and chest pain  EXAM: PORTABLE CHEST 1 VIEW COMPARISON:  September 30, 2018 FINDINGS: There is airspace consolidation in the left lower lobe with small left  pleural effusion. There is mild atelectatic change in each lower lobe. Heart is borderline enlarged with mild pulmonary venous hypertension. No adenopathy. No bone lesions. IMPRESSION: 1. Airspace consolidation consistent with pneumonia left base with small left pleural effusion. 2.  Lower lobe atelectatic change bilaterally. 3.  Pulmonary vascular congestion. Electronically Signed   By: Lowella Grip III M.D.   On: 10/31/2018 15:49    EKG: Independently reviewed. TWI I, II, III, aVF, laterally, appears unchanged from June.  No ST changes  Assessment/Plan CAP (community acquired pneumonia) - Interesting history.  Previously treated for COVID in April, remained positive in June, now negative.  However, she seems to have a clear infiltrate on CXR with pulmonary congestion, will treat for pneumonia given history - Continue oxygen to keep saturation > 92% - Azithromycin and Rocephin - Urinary antigen for strep - Check inflammatory markers, HIV - WBC normal, add on differential - IS to bedside  Acute on chronic diastolic CHF (congestive heart failure)  Acute and chronic respiratory failure with hypoxia  - Unclear cause for acute exacerbation - HS troponin is normal X 2 - EKG is unchanged - Repeat TTE ordered - IV lasix 62m BID ordered (on 425moral at home) - Strict I/O - Daily weight (weight down 2kg from last discharge) - BNP stable - Protein mildly low, also with renal failure, may need higher dose of lasix on discharge - Consider cardiology consult if not improving as expected.  - Telemetry - Continue beta blocker - If HFpEF confirmed, or now with HFrEF, consider ARB initiation during hospital stay.   Hypertension - She reports taking her medications this morning - Labetalol given in the ED - Monitor, treat acutely for SBP > 200 given h/o CVA    Type 2 diabetes mellitus with diabetic neuropathy, unspecified - On glargine 5 and a sliding scale - Glargine ordered - SSI  -  Heart healthy diabetic diet ordered    HLD (hyperlipidemia) - Continue atorvastatin    CKD (chronic kidney disease), stage III  - Renal function appears to be at baseline and improved from last admission  Normocytic anemia - At baseline, likely related to renal disease - Ferritin is being checked as an inflammatory marker  History of spinal stenosis - Continue gabapentin, duloxetine     DVT prophylaxis: Lovenox  Code Status: Full, confirmed with patient.  Disposition Plan: Admit for further diuresis, antibiotics Consults called: None, consider cardiology if not doing well  Admission status: Med Tele, INP    EmGilles ChiquitoD [facility]  If 7PM-7AM, please contact night-coverage [email or web address] Password [email or web address]7/29/2020, 8:58 PM

## 2018-10-31 NOTE — ED Provider Notes (Addendum)
[facility] EMERGENCY DEPARTMENT Provider Note   CSN: [id number] Arrival date & time: 10/31/18  1425     History   Chief Complaint Chief Complaint  Patient presents with   Shortness of Breath   CHF exacerbation    HPI Kara Hanson is a 63 y.o. female.     HPI 63 yo female with CHF, CVA, type 2 diabetes on insulin, hypercholesterolemia, hypertension, MI, COVID +June 22 presents today with increasing dyspnea over the past 2 days.  She reports some chest tightness today.  The symptoms became much worse today.  She reports taking her medications as prescribed.  She has not had any infectious symptoms with COVID.  She denies any productive cough.  Her chest is only slightly tight and is not similar to symptoms that she had with her MI.  She feels like she has increased volume.  Her dyspnea is worse with exertion and laying down flat.  Echo 03/21/18 ------------------------------------------------------------------- Study Conclusions  - Left ventricle: The cavity size was normal. Systolic function was   at the lower limits of normal. The estimated ejection fraction   was in the range of 50% to 55%. Wall motion was normal; there   were no regional wall motion abnormalities. Features are   consistent with a pseudonormal left ventricular filling pattern,   with concomitant abnormal relaxation and increased filling   pressure (grade 2 diastolic dysfunction).  ------------------------------------------------------------------- Study data:  Comparison was made to the study of 11/27/2017.  Study status:  Routine.  Procedure:  The patient reported no pain pre or post test. Transthoracic echocardiography. Image quality was adequate.  Study completion:  There were no complications. Transthoracic echocardiography.  M-mode, limited 2D, limited spectral Doppler, and color Doppler.  Birthdate:  Patient birthdate: [birth year].  Age:  Patient is 63 yr old.  Sex:   Gender: female.    BMI: 35 kg/m^2.  Blood pressure:     158/67  Patient status:  Inpatient.  Study date:  Study date: 03/21/2018. Study time: 02:20 PM.  Location:  Bedside.  -------------------------------------------------------------------  ------------------------------------------------------------------- Left ventricle:  The cavity size was normal. Systolic function was at the lower limits of normal. The estimated ejection fraction was in the range of 50% to 55%. Wall motion was normal; there were no regional wall motion abnormalities. Features are consistent with a pseudonormal left ventricular filling pattern, with concomitant abnormal relaxation and increased filling pressure (grade 2 diastolic dysfunction).  ------------------------------------------------------------------- Aortic valve:   Trileaflet; normal thickness leaflets. Mobility was not restricted.  Doppler:  Transvalvular velocity was within the normal range. There was no stenosis. There was no regurgitation.   ------------------------------------------------------------------- Aorta:  Aortic root: The aortic root was normal in size.  ------------------------------------------------------------------- Mitral valve:   Structurally normal valve.   Mobility was not restricted.  Doppler:  Transvalvular velocity was within the normal range. There was no evidence for stenosis. There was no regurgitation.    Peak gradient (D): 5 mm Hg.  ------------------------------------------------------------------- Left atrium:  The atrium was normal in size.  ------------------------------------------------------------------- Right ventricle:  The cavity size was normal. Wall thickness was normal. Systolic function was normal.  ------------------------------------------------------------------- Pulmonic valve:   Poorly visualized.  Doppler:  Transvalvular velocity was within the normal range. There was no evidence  for stenosis.  ------------------------------------------------------------------- Tricuspid valve:   Structurally normal valve.    Doppler: Transvalvular velocity was within the normal range. There was no regurgitation.  ------------------------------------------------------------------- Pulmonary artery:   The main pulmonary artery was normal-sized. Systolic  pressure could not be accurately estimated.  ------------------------------------------------------------------- Right atrium:  The atrium was normal in size.  ------------------------------------------------------------------- Pericardium:  There was no pericardial effusion.  ------------------------------------------------------------------- Systemic veins: Inferior vena cava: The vessel was normal in size.  ------------------------------------------------------------------- Measurements   Left ventricle                             Value        Reference  LV ID, ED, PLAX chordal                    48.2  mm     43 - 52  LV ID, ES, PLAX chordal                    36.9  mm     23 - 38  LV fx shortening, PLAX chordal   (L)       23    %      >=29  LV PW thickness, ED                        10.7  mm     ---------  IVS/LV PW ratio, ED                        1.15         <=1.3  Stroke volume, 2D                          67    ml     ---------  Stroke volume/bsa, 2D                      36    ml/m^2 ---------  LV ejection fraction, 1-p A4C              45    %      ---------  LV end-diastolic volume, 2-p               140   ml     ---------  LV end-systolic volume, 2-p                71    ml     ---------  LV ejection fraction, 2-p                  49    %      ---------  Stroke volume, 2-p                         69    ml     ---------  LV end-diastolic volume/bsa, 2-p           76    ml/m^2 ---------  LV end-systolic volume/bsa, 2-p            38    ml/m^2 ---------  Stroke volume/bsa, 2-p                     37.4   ml/m^2 ---------  LV e&', lateral                             5.44  cm/s   ---------  LV E/e&', lateral  20.59        ---------  LV e&', medial                              4.79  cm/s   ---------  LV E/e&', medial                            23.38        ---------  LV e&', average                             5.12  cm/s   ---------  LV E/e&', average                           21.9         ---------    Ventricular septum                         Value        Reference  IVS thickness, ED                          12.3  mm     ---------    LVOT                                       Value        Reference  LVOT ID, S                                 20    mm     ---------  LVOT area                                  3.14  cm^2   ---------  LVOT peak velocity, S                      81.3  cm/s   ---------  LVOT mean velocity, S                      55.7  cm/s   ---------  LVOT VTI, S                                21.2  cm     ---------    Aorta                                      Value        Reference  Aortic root ID, ED                         28    mm     ---------    Left atrium  Value        Reference  LA ID, A-P, ES                             32    mm     ---------  LA ID/bsa, A-P                             1.73  cm/m^2 <=2.2    Mitral valve                               Value        Reference  Mitral E-wave peak velocity                112   cm/s   ---------  Mitral A-wave peak velocity                89.2  cm/s   ---------  Mitral deceleration time                   187   ms     150 - 230  Mitral peak gradient, D                    5     mm Hg  ---------  Mitral E/A ratio, peak                     1.3          ---------  Legend: (L)  and  (H)  mark values outside specified reference range.  ------------------------------------------------------------------- Prepared and Electronically Authenticated by  Sanda Klein,  MD 2019-12-18T16:04:09 Past Medical History:  Diagnosis Date   CHF (congestive heart failure) ([place])    CVA (cerebral vascular accident) ([place])    Diabetes mellitus without complication ([place])    Hypercholesteremia    Hypertension    Myocardial infarction [facility])    Spinal stenosis     Patient Active Problem List   Diagnosis Date Noted   Acute CHF (congestive heart failure) ([place]) 09/24/2018   SIRS (systemic inflammatory response syndrome) ([place]) 07/25/2018   COVID-19 virus infection 07/20/2018   Chronic diastolic CHF (congestive heart failure) ([place]) 07/20/2018   Pneumonia due to COVID-19 virus 07/20/2018   Vitamin D deficiency 04/02/2018   Spinal stenosis 03/29/2018   Acute and chronic respiratory failure with hypoxia ([place]) 03/20/2018   Diabetes mellitus type 2 with complications, uncontrolled ([place]) 03/20/2018   CKD (chronic kidney disease), stage III ([place]) 03/20/2018   Acute on chronic diastolic CHF (congestive heart failure) ([place]) 11/27/2017   Acute kidney injury ([place]) 11/27/2017   Acute respiratory distress 11/27/2017   Bulging lumbar disc 11/14/2017   Gout 11/14/2017   HLD (hyperlipidemia) 09/30/2016   Chest pain 06/17/2016   Stress-induced cardiomyopathy 05/19/2016   Type 2 diabetes mellitus with diabetic neuropathy, unspecified ([place]) 05/06/2016   Vertigo 05/06/2016   Controlled type 2 diabetes mellitus with hyperglycemia ([place]) 04/23/2016   Hypertension 04/23/2016   Normochromic normocytic anemia 04/23/2016   Syncope 04/23/2016    Past Surgical History:  Procedure Laterality Date   BLADDER SURGERY     CARDIAC CATHETERIZATION N/A 04/25/2016   Procedure: Left Heart Cath and Coronary Angiography;  Surgeon: Lorretta Harp, MD;  Location: [place] [facility] CV LAB;  Service: Cardiovascular;  Laterality: N/A;  CESAREAN SECTION     CHOLECYSTECTOMY       OB History   No obstetric history on file.      Home Medications    Prior to  Admission medications   Medication Sig Start Date End Date Taking? Authorizing Provider  Accu-Chek FastClix Lancets MISC USE AS DIRECTED TO TEST BLOOD SUGAR THREE TIMES DAILY 07/31/18   [name], MD  allopurinol (ZYLOPRIM) 300 MG tablet Take 1 tablet (300 mg total) by mouth daily. 09/17/18   [name], MD  amLODipine (NORVASC) 5 MG tablet TAKE 1 TABLET (5 MG TOTAL) BY MOUTH AT BEDTIME. 09/01/18 10/22/18  [name], MD  aspirin EC 81 MG tablet Take 81 mg by mouth daily.    [provider]  atorvastatin (LIPITOR) 80 MG tablet Take 1 tablet (80 mg total) by mouth daily at 6 PM for 30 days. 09/17/18 10/22/18  [name], MD  Blood Glucose Monitoring Suppl (ACCU-CHEK GUIDE) w/Device KIT 1 each by Does not apply route 3 (three) times daily. 07/31/18   [name], MD  carvedilol (COREG) 12.5 MG tablet Take 1 tablet (12.5 mg total) by mouth 2 (two) times daily with a meal. 09/17/18   [name], MD  DULoxetine (CYMBALTA) 60 MG capsule Take 1 capsule (60 mg total) by mouth daily. For chronic back and neck pain 08/07/18   [name], MD  EASY COMFORT PEN NEEDLES 31G X 5 MM MISC USE FOUR TIMES PER DAY FOR INSULIN ADMINISTRATION 10/02/18   [name], MD  ergocalciferol (DRISDOL) 1.25 MG (50000 UT) capsule Take 1 capsule (50,000 Units total) by mouth once a week. 09/18/18   [name], MD  furosemide (LASIX) 40 MG tablet Take 1 tablet (40 mg total) by mouth daily. 10/03/18   Bonnielee Haff, MD  gabapentin (NEURONTIN) 300 MG capsule TAKE 1 CAPSULE (300 MG TOTAL) BY MOUTH 2 (TWO) TIMES DAILY. 09/03/18 10/22/18  [name], MD  glucose blood (ACCU-CHEK GUIDE) test strip USE AS DIRECTED TO TEST BLOOD SUGAR THREE TIMES DAILY 07/31/18   [name], MD  insulin aspart (NOVOLOG) 100 UNIT/ML injection 0 to 12 units subcutaneously 3 times daily before meals as per sliding scale 03/29/18   [name], MD  Insulin Glargine (BASAGLAR KWIKPEN) 100 UNIT/ML SOPN Inject  0.05 mLs (5 Units total) into the skin at bedtime. 10/02/18   Bonnielee Haff, MD  Lancet Device MISC Use as instructed 3 times daily 09/19/17   Renita Papa, PA-C  Misc. Devices MISC Portable oxygen concentrator.  Diagnosis-chronic respiratory failure. 09/03/18   [name], MD  Misc. Devices MISC Rollaor with seat. Dx: Congestive Heart Failure 10/19/18   [name], MD  omeprazole (PRILOSEC) 20 MG capsule Take 20 mg by mouth daily.    [provider]  ondansetron (ZOFRAN) 4 MG tablet Take 1 tablet (4 mg total) by mouth every 6 (six) hours as needed for nausea. 07/25/18   Charlynne Cousins, MD  polyethylene glycol (MIRALAX / GLYCOLAX) 17 g packet Take 17 g by mouth daily. 10/02/18   Bonnielee Haff, MD  SUPER B COMPLEX/C CAPS Take 1 capsule by mouth daily.    [provider]  tiZANidine (ZANAFLEX) 4 MG tablet TAKE 1 TABLET (4 MG TOTAL) BY MOUTH EVERY 8 (EIGHT) HOURS AS NEEDED FOR MUSCLE SPASMS. 09/01/18   [name], MD    Family History Family History  Problem Relation Age of Onset   Diabetes Mellitus II Father    Stroke Father  Social History Social History   Tobacco Use   Smoking status: Never Smoker   Smokeless tobacco: Never Used  Substance Use Topics   Alcohol use: No   Drug use: No     Allergies   Garlic, Latex, Morphine and related, and Other   Review of Systems Review of Systems  All other systems reviewed and are negative.    Physical Exam Updated Vital Signs BP (!) 186/82 (BP Location: Right Arm)    Pulse 83    Temp 98.2 F (36.8 C) (Oral)    Resp (!) 26    Ht 1.499 m (4' 11" )    Wt 72.6 kg    SpO2 100%    BMI 32.32 kg/m   Physical Exam Vitals signs and nursing note reviewed.  Constitutional:      General: She is in acute distress.     Appearance: She is well-developed. She is obese. She is not ill-appearing, toxic-appearing or diaphoretic.  HENT:     Head: Normocephalic.     Mouth/Throat:     Mouth: Mucous  membranes are moist.  Eyes:     Pupils: Pupils are equal, round, and reactive to light.  Neck:     Musculoskeletal: Normal range of motion.  Cardiovascular:     Rate and Rhythm: Normal rate and regular rhythm.  Pulmonary:     Effort: Tachypnea present.     Comments: Decreased breath sounds left base Chest:     Chest wall: No mass or deformity.  Abdominal:     Palpations: Abdomen is soft.  Musculoskeletal: Normal range of motion.     Right lower leg: She exhibits no tenderness. Edema present.     Left lower leg: She exhibits no tenderness. Edema present.  Skin:    General: Skin is warm and dry.     Capillary Refill: Capillary refill takes less than 2 seconds.  Neurological:     General: No focal deficit present.     Mental Status: She is alert.  Psychiatric:        Mood and Affect: Mood normal.      ED Treatments / Results  Labs (all labs ordered are listed, but only abnormal results are displayed) Labs Reviewed - No data to display  EKG EKG Interpretation  Date/Time:  Wednesday October 31 2018 15:26:22 EDT Ventricular Rate:  84 PR Interval:    QRS Duration: 89 QT Interval:  385 QTC Calculation: 456 R Axis:   78 Text Interpretation:  Sinus rhythm Borderline repolarization abnormality No significant change since last tracing Confirmed by Pattricia Boss [phone]) on 10/31/2018 5:55:13 PM   Radiology Dg Chest Port 1 View  Result Date: 10/31/2018 CLINICAL DATA:  Shortness of breath and chest pain EXAM: PORTABLE CHEST 1 VIEW COMPARISON:  September 30, 2018 FINDINGS: There is airspace consolidation in the left lower lobe with small left pleural effusion. There is mild atelectatic change in each lower lobe. Heart is borderline enlarged with mild pulmonary venous hypertension. No adenopathy. No bone lesions. IMPRESSION: 1. Airspace consolidation consistent with pneumonia left base with small left pleural effusion. 2.  Lower lobe atelectatic change bilaterally. 3.  Pulmonary vascular  congestion. Electronically Signed   By: Lowella Grip III M.D.   On: 10/31/2018 15:49    Procedures Procedures (including critical care time)  Medications Ordered in ED Medications - No data to display   Initial Impression / Assessment and Plan / ED Course  I have reviewed the triage vital signs and the nursing  notes.  Pertinent labs & imaging results that were available during my care of the patient were reviewed by me and considered in my medical decision making (see chart for details).    63 yo female ho chf presents with increasing edema and dyspnea CXR with airspace consolidation left base with small left pleural effusion Suspect some additional heart failure with hypertension poorly controlled IV lasix, labetalol, rocephin and zithromax given here Patient with sats at 96% on 4 l/m Covid test negative  Discussed with hospitalist service Dr. Richardean Canal will see for admission  CRITICAL CARE Performed by: Pattricia Boss Total critical care time: 45 minutes Critical care time was exclusive of separately billable procedures and treating other patients. Critical care was necessary to treat or prevent imminent or life-threatening deterioration. Critical care was time spent personally by me on the following activities: development of treatment plan with patient and/or surrogate as well as nursing, discussions with consultants, evaluation of patient's response to treatment, examination of patient, obtaining history from patient or surrogate, ordering and performing treatments and interventions, ordering and review of laboratory studies, ordering and review of radiographic studies, pulse oximetry and re-evaluation of patient's condition.  Final Clinical Impressions(s) / ED Diagnoses   Final diagnoses:  Dyspnea, unspecified type    ED Discharge Orders    None       Pattricia Boss, MD 11/01/18 1527    Pattricia Boss, MD 11/19/18 1407

## 2018-10-31 NOTE — ED Notes (Signed)
ED TO INPATIENT HANDOFF REPORT  ED Nurse Name and Phone #: William Hamburger, [place]  S Name/Age/Gender Kara Hanson 63 y.o. female Room/Bed: 023C/023C  Code Status   Code Status: Prior  Home/SNF/Other Home Patient oriented to: situation Is this baseline? No   Triage Complete: Triage complete  Chief Complaint Difficulty breathing  Triage Note Pt arrives with [facility] from home c/o increased shortness of breath over the past few days. Pt has CHF and HTN. Pt reports a decrease in urine output and has increased her lasix from 40 mg to 80 mg and had "only a time bit" of urine output. Pt normal wears 3L O2 at home and was placed on a non-rebreather by EMS at 15 L. Pt was 91% on 3L with EMS.  EMS vitals:  160/75 80 HR 18 RR 187 CBG Temp 97.5   Allergies Allergies  Allergen Reactions  . Garlic Shortness Of Breath, Itching and Swelling    Hand itching and swelling  . Latex Itching  . Morphine And Related Itching and Other (See Comments)    Headache   . Other Itching    Reaction to newspaper ink -itching and headache    Level of Care/Admitting Diagnosis ED Disposition    None      B Medical/Surgery History Past Medical History:  Diagnosis Date  . CHF (congestive heart failure) ([place])   . CVA (cerebral vascular accident) ([place])   . Diabetes mellitus without complication ([place])   . Hypercholesteremia   . Hypertension   . Myocardial infarction ([place])   . Spinal stenosis    Past Surgical History:  Procedure Laterality Date  . BLADDER SURGERY    . CARDIAC CATHETERIZATION N/A 04/25/2016   Procedure: Left Heart Cath and Coronary Angiography;  Surgeon: Lorretta Harp, MD;  Location: [facility];  Service: Cardiovascular;  Laterality: N/A;  . CESAREAN SECTION    . CHOLECYSTECTOMY       A IV Location/Drains/Wounds Patient Lines/Drains/Airways Status   Active Line/Drains/Airways    Name:   Placement date:   Placement time:   Site:   Days:   Peripheral IV  10/31/18 Left Antecubital   10/31/18    1440    Antecubital   less than 1   Peripheral IV 10/31/18 Right Arm   10/31/18    1825    Arm   less than 1          Intake/Output Last 24 hours No intake or output data in the 24 hours ending 10/31/18 1843  Labs/Imaging Results for orders placed or performed during the hospital encounter of 10/31/18 (from the past 48 hour(s))  Brain natriuretic peptide     Status: Abnormal   Collection Time: 10/31/18  3:13 PM  Result Value Ref Range   B Natriuretic Peptide 422.8 (H) 0.0 - 100.0 pg/mL    Comment: Performed at [facility], [address], [place], [place] [address]  CBC     Status: Abnormal   Collection Time: 10/31/18  3:30 PM  Result Value Ref Range   WBC 7.6 4.0 - 10.5 K/uL   RBC 4.04 3.87 - 5.11 MIL/uL   Hemoglobin 10.9 (L) 12.0 - 15.0 g/dL   HCT 35.4 (L) 36.0 - 46.0 %   MCV 87.6 80.0 - 100.0 fL   MCH 27.0 26.0 - 34.0 pg   MCHC 30.8 30.0 - 36.0 g/dL   RDW 16.0 (H) 11.5 - 15.5 %   Platelets 260 150 - 400  K/uL   nRBC 0.0 0.0 - 0.2 %    Comment: Performed at [facility], [place] [address], [place], [place] [address]  Comprehensive metabolic panel     Status: Abnormal   Collection Time: 10/31/18  3:30 PM  Result Value Ref Range   Sodium 139 135 - 145 mmol/L   Potassium 3.8 3.5 - 5.1 mmol/L   Chloride 106 98 - 111 mmol/L   CO2 23 22 - 32 mmol/L   Glucose, Bld 208 (H) 70 - 99 mg/dL   BUN 38 (H) 8 - 23 mg/dL   Creatinine, Ser 1.34 (H) 0.44 - 1.00 mg/dL   Calcium 9.4 8.9 - 10.3 mg/dL   Total Protein 6.2 (L) 6.5 - 8.1 g/dL   Albumin 3.0 (L) 3.5 - 5.0 g/dL   AST 14 (L) 15 - 41 U/L   ALT 16 0 - 44 U/L   Alkaline Phosphatase 69 38 - 126 U/L   Total Bilirubin 0.5 0.3 - 1.2 mg/dL   GFR calc non Af Amer 42 (L) >60 mL/min   GFR calc Af Amer 49 (L) >60 mL/min   Anion gap 10 5 - 15    Comment: Performed at [facility], [place] [address], [place], [place] [address]  Troponin I (High Sensitivity)     Status: None   Collection  Time: 10/31/18  3:30 PM  Result Value Ref Range   Troponin I (High Sensitivity) 12 <18 ng/L    Comment: (NOTE) Elevated high sensitivity troponin I (hsTnI) values and significant  changes across serial measurements may suggest ACS but many other  chronic and acute conditions are known to elevate hsTnI results.  Refer to the "Links" section for chest pain algorithms and additional  guidance. Performed at [facility], [place] [address] [place], [place] [address]   SARS Coronavirus 2 (CEPHEID - Performed in [place] hospital lab), Hosp Order     Status: None   Collection Time: 10/31/18  3:36 PM   Specimen: Nasopharyngeal Swab  Result Value Ref Range   SARS Coronavirus 2 NEGATIVE NEGATIVE    Comment: (NOTE) If result is NEGATIVE SARS-CoV-2 target nucleic acids are NOT DETECTED. The SARS-CoV-2 RNA is generally detectable in upper and lower  respiratory specimens during the acute phase of infection. The lowest  concentration of SARS-CoV-2 viral copies this assay can detect is 250  copies / mL. A negative result does not preclude SARS-CoV-2 infection  and should not be used as the sole basis for treatment or other  patient management decisions.  A negative result may occur with  improper specimen collection / handling, submission of specimen other  than nasopharyngeal swab, presence of viral mutation(s) within the  areas targeted by this assay, and inadequate number of viral copies  (<250 copies / mL). A negative result must be combined with clinical  observations, patient history, and epidemiological information. If result is POSITIVE SARS-CoV-2 target nucleic acids are DETECTED. The SARS-CoV-2 RNA is generally detectable in upper and lower  respiratory specimens dur ing the acute phase of infection.  Positive  results are indicative of active infection with SARS-CoV-2.  Clinical  correlation with patient history and other diagnostic information is  necessary to determine  patient infection status.  Positive results do  not rule out bacterial infection or co-infection with other viruses. If result is PRESUMPTIVE POSTIVE SARS-CoV-2 nucleic acids MAY BE PRESENT.   A presumptive positive result was obtained on the submitted specimen  and  confirmed on repeat testing.  While 2019 novel coronavirus  (SARS-CoV-2) nucleic acids may be present in the submitted sample  additional confirmatory testing may be necessary for epidemiological  and / or clinical management purposes  to differentiate between  SARS-CoV-2 and other Sarbecovirus currently known to infect humans.  If clinically indicated additional testing with an alternate test  methodology [phone]) is advised. The SARS-CoV-2 RNA is generally  detectable in upper and lower respiratory sp ecimens during the acute  phase of infection. The expected result is Negative. Fact Sheet for Patients:  StrictlyIdeas.no Fact Sheet for Healthcare Providers: BankingDealers.co.za This test is not yet approved or cleared by the [place] FDA and has been authorized for detection and/or diagnosis of SARS-CoV-2 by FDA under an Emergency Use Authorization (EUA).  This EUA will remain in effect (meaning this test can be used) for the duration of the COVID-19 declaration under Section 564(b)(1) of the Act, 21 U.S.C. section 360bbb-3(b)(1), unless the authorization is terminated or revoked sooner. Performed at [facility], [place] [address], [place], [place] [address]   CBG monitoring, ED     Status: Abnormal   Collection Time: 10/31/18  4:20 PM  Result Value Ref Range   Glucose-Capillary 212 (H) 70 - 99 mg/dL  Troponin I (High Sensitivity)     Status: None   Collection Time: 10/31/18  5:45 PM  Result Value Ref Range   Troponin I (High Sensitivity) 12 <18 ng/L    Comment: (NOTE) Elevated high sensitivity troponin I (hsTnI) values and significant  changes across serial  measurements may suggest ACS but many other  chronic and acute conditions are known to elevate hsTnI results.  Refer to the "Links" section for chest pain algorithms and additional  guidance. Performed at [facility], [place] [address], [place], [place] [address]   POCT I-Stat EG7     Status: Abnormal   Collection Time: 10/31/18  6:25 PM  Result Value Ref Range   pH, Ven 7.331 7.250 - 7.430   pCO2, Ven 51.9 44.0 - 60.0 mmHg   pO2, Ven 53.0 (H) 32.0 - 45.0 mmHg   Bicarbonate 27.4 20.0 - 28.0 mmol/L   TCO2 29 22 - 32 mmol/L   O2 Saturation 84.0 %   Acid-Base Excess 1.0 0.0 - 2.0 mmol/L   Sodium 138 135 - 145 mmol/L   Potassium 4.0 3.5 - 5.1 mmol/L   Calcium, Ion 1.26 1.15 - 1.40 mmol/L   HCT 40.0 36.0 - 46.0 %   Hemoglobin 13.6 12.0 - 15.0 g/dL   Patient temperature HIDE    Sample type VENOUS    Dg Chest Port 1 View  Result Date: 10/31/2018 CLINICAL DATA:  Shortness of breath and chest pain EXAM: PORTABLE CHEST 1 VIEW COMPARISON:  September 30, 2018 FINDINGS: There is airspace consolidation in the left lower lobe with small left pleural effusion. There is mild atelectatic change in each lower lobe. Heart is borderline enlarged with mild pulmonary venous hypertension. No adenopathy. No bone lesions. IMPRESSION: 1. Airspace consolidation consistent with pneumonia left base with small left pleural effusion. 2.  Lower lobe atelectatic change bilaterally. 3.  Pulmonary vascular congestion. Electronically Signed   By: Lowella Grip III M.D.   On: 10/31/2018 15:49    Pending Labs Unresulted Labs (From admission, onward)    Start     Ordered   10/31/18 1755  Blood gas, venous (at [facility] and [facility], not at [facility])  ONCE - STAT,   STAT  10/31/18 1754          Vitals/Pain Today's Vitals   10/31/18 1745 10/31/18 1800 10/31/18 1815 10/31/18 1830  BP: (!) 203/103 (!) 201/85 (!) 222/99 (!) 192/84  Pulse: 88 91 (!) 110 87  Resp: (!) 29 (!) 23 (!) 21 (!) 24  Temp:      TempSrc:      SpO2:  94% 93% 99% 99%  Weight:      Height:      PainSc:        Isolation Precautions No active isolations  Medications Medications  azithromycin (ZITHROMAX) 500 mg in sodium chloride 0.9 % 250 mL IVPB (500 mg Intravenous New Bag/Given 10/31/18 1833)  furosemide (LASIX) injection 60 mg (60 mg Intravenous Given 10/31/18 1548)  cefTRIAXone (ROCEPHIN) 1 g in sodium chloride 0.9 % 100 mL IVPB (0 g Intravenous Stopped 10/31/18 1823)  labetalol (NORMODYNE) injection 20 mg (20 mg Intravenous Given 10/31/18 1827)    Mobility walks Moderate fall risk   Focused Assessments Pulmonary Assessment Handoff:  Lung sounds: Bilateral Breath Sounds: Diminished, Fine crackles L Breath Sounds: Diminished, Fine crackles O2 Device: Nasal Cannula O2 Flow Rate (L/min): 3 L/min      R Recommendations: See Admitting Provider Note  Report given to:   Additional Notes: CHF exac. + PNA; covid - ; ABX given, Lasix and labetalol. IV access x2.

## 2018-10-31 NOTE — ED Notes (Signed)
Pt in bed; Sp02 at 97%; RR increased to 30 +; stated "I can't get air, I need the mask one, get I get the mask and let me sit at the edge of the bed" Nonrebreather mask applied @12L ;

## 2018-11-01 ENCOUNTER — Other Ambulatory Visit: Payer: Self-pay

## 2018-11-01 ENCOUNTER — Encounter (HOSPITAL_COMMUNITY): Payer: Self-pay | Admitting: General Practice

## 2018-11-01 ENCOUNTER — Inpatient Hospital Stay (HOSPITAL_COMMUNITY): Payer: Medicare Other

## 2018-11-01 DIAGNOSIS — I340 Nonrheumatic mitral (valve) insufficiency: Secondary | ICD-10-CM

## 2018-11-01 DIAGNOSIS — J189 Pneumonia, unspecified organism: Secondary | ICD-10-CM

## 2018-11-01 HISTORY — DX: Pneumonia, unspecified organism: J18.9

## 2018-11-01 LAB — HIV ANTIBODY (ROUTINE TESTING W REFLEX): HIV Screen 4th Generation wRfx: NONREACTIVE

## 2018-11-01 LAB — BASIC METABOLIC PANEL
Anion gap: 13 (ref 5–15)
BUN: 41 mg/dL — ABNORMAL HIGH (ref 8–23)
CO2: 23 mmol/L (ref 22–32)
Calcium: 9.5 mg/dL (ref 8.9–10.3)
Chloride: 104 mmol/L (ref 98–111)
Creatinine, Ser: 1.51 mg/dL — ABNORMAL HIGH (ref 0.44–1.00)
GFR calc Af Amer: 42 mL/min — ABNORMAL LOW (ref >60)
GFR calc non Af Amer: 36 mL/min — ABNORMAL LOW (ref >60)
Glucose, Bld: 319 mg/dL — ABNORMAL HIGH (ref 70–99)
Potassium: 3.9 mmol/L (ref 3.5–5.1)
Sodium: 140 mmol/L (ref 135–145)

## 2018-11-01 LAB — GLUCOSE, CAPILLARY
Glucose-Capillary: 237 mg/dL — ABNORMAL HIGH (ref 70–99)
Glucose-Capillary: 224 mg/dL — ABNORMAL HIGH (ref 70–99)
Glucose-Capillary: 186 mg/dL — ABNORMAL HIGH (ref 70–99)

## 2018-11-01 LAB — ECHOCARDIOGRAM COMPLETE
Height: 59 in
Weight: 2560 oz

## 2018-11-01 LAB — CBC
HCT: 37.8 % (ref 36.0–46.0)
Hemoglobin: 11.8 g/dL — ABNORMAL LOW (ref 12.0–15.0)
MCH: 27.2 pg (ref 26.0–34.0)
MCHC: 31.2 g/dL (ref 30.0–36.0)
MCV: 87.1 fL (ref 80.0–100.0)
Platelets: 294 10*3/uL (ref 150–400)
RBC: 4.34 MIL/uL (ref 3.87–5.11)
RDW: 16 % — ABNORMAL HIGH (ref 11.5–15.5)
WBC: 8.1 10*3/uL (ref 4.0–10.5)
nRBC: 0 % (ref 0.0–0.2)

## 2018-11-01 LAB — CBG MONITORING, ED
Glucose-Capillary: 270 mg/dL — ABNORMAL HIGH (ref 70–99)
Glucose-Capillary: 309 mg/dL — ABNORMAL HIGH (ref 70–99)

## 2018-11-01 MED ORDER — ENSURE ENLIVE PO LIQD
237.0000 mL | Freq: Two times a day (BID) | ORAL | Status: DC
  Administered 2018-11-01 – 2018-11-02 (×2): 237 mL via ORAL

## 2018-11-01 MED ORDER — FUROSEMIDE 10 MG/ML IJ SOLN
40.0000 mg | Freq: Two times a day (BID) | INTRAMUSCULAR | Status: DC
  Administered 2018-11-01 – 2018-11-03 (×5): 40 mg via INTRAVENOUS
  Filled 2018-11-01 (×5): qty 4

## 2018-11-01 MED ORDER — DULOXETINE HCL 60 MG PO CPEP
60.0000 mg | ORAL_CAPSULE | Freq: Every day | ORAL | Status: DC
  Administered 2018-11-01 – 2018-11-05 (×5): 60 mg via ORAL
  Filled 2018-11-01 (×5): qty 1

## 2018-11-01 MED ORDER — AZITHROMYCIN 500 MG PO TABS
500.0000 mg | ORAL_TABLET | Freq: Every day | ORAL | Status: AC
  Administered 2018-11-01 – 2018-11-05 (×5): 500 mg via ORAL
  Filled 2018-11-01: qty 1
  Filled 2018-11-01: qty 2
  Filled 2018-11-01 (×3): qty 1

## 2018-11-01 MED ORDER — AMLODIPINE BESYLATE 5 MG PO TABS
5.0000 mg | ORAL_TABLET | Freq: Every day | ORAL | Status: DC
  Administered 2018-11-01 – 2018-11-04 (×5): 5 mg via ORAL
  Filled 2018-11-01 (×6): qty 1

## 2018-11-01 MED ORDER — SODIUM CHLORIDE 0.9 % IV SOLN
2.0000 g | INTRAVENOUS | Status: AC
  Administered 2018-11-01 – 2018-11-05 (×5): 2 g via INTRAVENOUS
  Filled 2018-11-01 (×5): qty 20

## 2018-11-01 MED ORDER — ENOXAPARIN SODIUM 40 MG/0.4ML ~~LOC~~ SOLN
40.0000 mg | Freq: Every day | SUBCUTANEOUS | Status: DC
  Administered 2018-11-01 – 2018-11-02 (×2): 40 mg via SUBCUTANEOUS
  Filled 2018-11-01 (×3): qty 0.4

## 2018-11-01 MED ORDER — PNEUMOCOCCAL VAC POLYVALENT 25 MCG/0.5ML IJ INJ
0.5000 mL | INJECTION | INTRAMUSCULAR | Status: AC
  Administered 2018-11-03: 0.5 mL via INTRAMUSCULAR
  Filled 2018-11-01: qty 0.5

## 2018-11-01 MED ORDER — INSULIN GLARGINE 100 UNIT/ML ~~LOC~~ SOLN
5.0000 [IU] | Freq: Every day | SUBCUTANEOUS | Status: DC
  Administered 2018-11-01 – 2018-11-04 (×5): 5 [IU] via SUBCUTANEOUS
  Filled 2018-11-01 (×6): qty 0.05

## 2018-11-01 MED ORDER — ONDANSETRON HCL 4 MG PO TABS
4.0000 mg | ORAL_TABLET | Freq: Four times a day (QID) | ORAL | Status: DC | PRN
  Administered 2018-11-02: 4 mg via ORAL
  Filled 2018-11-01 (×2): qty 1

## 2018-11-01 MED ORDER — ASPIRIN EC 81 MG PO TBEC
81.0000 mg | DELAYED_RELEASE_TABLET | Freq: Every day | ORAL | Status: DC
  Administered 2018-11-01 – 2018-11-05 (×5): 81 mg via ORAL
  Filled 2018-11-01 (×5): qty 1

## 2018-11-01 MED ORDER — ALLOPURINOL 300 MG PO TABS
300.0000 mg | ORAL_TABLET | Freq: Every day | ORAL | Status: DC
  Administered 2018-11-01 – 2018-11-05 (×5): 300 mg via ORAL
  Filled 2018-11-01 (×5): qty 1

## 2018-11-01 MED ORDER — POLYETHYLENE GLYCOL 3350 17 G PO PACK
17.0000 g | PACK | Freq: Every day | ORAL | Status: DC
  Administered 2018-11-01 – 2018-11-04 (×3): 17 g via ORAL
  Filled 2018-11-01 (×4): qty 1

## 2018-11-01 MED ORDER — INSULIN ASPART 100 UNIT/ML ~~LOC~~ SOLN
0.0000 [IU] | Freq: Three times a day (TID) | SUBCUTANEOUS | Status: DC
  Administered 2018-11-01: 3 [IU] via SUBCUTANEOUS
  Administered 2018-11-01: 10:00:00 7 [IU] via SUBCUTANEOUS
  Administered 2018-11-01 (×2): 3 [IU] via SUBCUTANEOUS
  Administered 2018-11-02: 1 [IU] via SUBCUTANEOUS
  Administered 2018-11-02 – 2018-11-05 (×6): 2 [IU] via SUBCUTANEOUS

## 2018-11-01 MED ORDER — INSULIN ASPART 100 UNIT/ML ~~LOC~~ SOLN: 0.0000 [IU] | Freq: Every day | SUBCUTANEOUS | Status: DC

## 2018-11-01 MED ORDER — CARVEDILOL 12.5 MG PO TABS
12.5000 mg | ORAL_TABLET | Freq: Two times a day (BID) | ORAL | Status: DC
  Administered 2018-11-01 – 2018-11-05 (×9): 12.5 mg via ORAL
  Filled 2018-11-01 (×9): qty 1

## 2018-11-01 MED ORDER — PANTOPRAZOLE SODIUM 40 MG PO TBEC
40.0000 mg | DELAYED_RELEASE_TABLET | Freq: Every day | ORAL | Status: DC
  Administered 2018-11-01 – 2018-11-05 (×5): 40 mg via ORAL
  Filled 2018-11-01 (×5): qty 1

## 2018-11-01 MED ORDER — GABAPENTIN 300 MG PO CAPS
300.0000 mg | ORAL_CAPSULE | Freq: Two times a day (BID) | ORAL | Status: DC
  Administered 2018-11-01 – 2018-11-05 (×10): 300 mg via ORAL
  Filled 2018-11-01 (×10): qty 1

## 2018-11-01 MED ORDER — ATORVASTATIN CALCIUM 80 MG PO TABS
80.0000 mg | ORAL_TABLET | Freq: Every day | ORAL | Status: DC
  Administered 2018-11-01 – 2018-11-04 (×4): 80 mg via ORAL
  Filled 2018-11-01 (×4): qty 1

## 2018-11-01 NOTE — ED Notes (Signed)
ED TO INPATIENT HANDOFF REPORT  ED Nurse Name and Phone #: Marya Amsler RN [phone]  S Name/Age/Gender Kara Hanson 63 y.o. female Room/Bed: 046C/046C  Code Status   Code Status: Full Code  Home/SNF/Other Home Patient oriented to: self, place, time and situation Is this baseline? Yes   Triage Complete: Triage complete  Chief Complaint Difficulty breathing  Triage Note Pt arrives with [facility] from home c/o increased shortness of breath over the past few days. Pt has CHF and HTN. Pt reports a decrease in urine output and has increased her lasix from 40 mg to 80 mg and had "only a time bit" of urine output. Pt normal wears 3L O2 at home and was placed on a non-rebreather by EMS at 15 L. Pt was 91% on 3L with EMS.  EMS vitals:  160/75 80 HR 18 RR 187 CBG Temp 97.5   Allergies Allergies  Allergen Reactions  . Garlic Shortness Of Breath, Itching and Swelling    Hand itching and swelling  . Latex Itching  . Morphine And Related Itching and Other (See Comments)    Headache   . Other Itching    Reaction to newspaper ink -itching and headache    Level of Care/Admitting Diagnosis ED Disposition    ED Disposition Condition [place]: [place] [[id number]]  Level of Care: Telemetry Medical [104]  Covid Evaluation: Confirmed COVID Negative  Diagnosis: CAP (community acquired pneumonia) [458099]  Admitting Physician: Sid Falcon [phone]  Attending Physician: Cresenciano Lick  Estimated length of stay: 3 - 4 days  Certification:: I certify this patient will need inpatient services for at least 2 midnights  PT Class (Do Not Modify): Inpatient [101]  PT Acc Code (Do Not Modify): Private [1]       B Medical/Surgery History Past Medical History:  Diagnosis Date  . CHF (congestive heart failure) ([place])   . CVA (cerebral vascular accident) ([place])   . Diabetes mellitus without complication ([place])   . Hypercholesteremia   .  Hypertension   . Myocardial infarction ([place])   . Spinal stenosis    Past Surgical History:  Procedure Laterality Date  . BLADDER SURGERY    . CARDIAC CATHETERIZATION N/A 04/25/2016   Procedure: Left Heart Cath and Coronary Angiography;  Surgeon: Lorretta Harp, MD;  Location: [facility];  Service: Cardiovascular;  Laterality: N/A;  . CESAREAN SECTION    . CHOLECYSTECTOMY       A IV Location/Drains/Wounds Patient Lines/Drains/Airways Status   Active Line/Drains/Airways    Name:   Placement date:   Placement time:   Site:   Days:   Peripheral IV 10/31/18 Left Antecubital   10/31/18    1440    Antecubital   1   Peripheral IV 10/31/18 Right Arm   10/31/18    1825    Arm   1          Intake/Output Last 24 hours  Intake/Output Summary (Last 24 hours) at 11/01/2018 1211 Last data filed at 10/31/2018 2041 Gross per 24 hour  Intake -  Output 650 ml  Net -650 ml    Labs/Imaging Results for orders placed or performed during the hospital encounter of 10/31/18 (from the past 48 hour(s))  Brain natriuretic peptide     Status: Abnormal   Collection Time: 10/31/18  3:13 PM  Result Value Ref Range   B Natriuretic Peptide 422.8 (H) 0.0 - 100.0 pg/mL  Comment: Performed at [facility], [place] [address], [place], [place] [address]  CBC     Status: Abnormal   Collection Time: 10/31/18  3:30 PM  Result Value Ref Range   WBC 7.6 4.0 - 10.5 K/uL   RBC 4.04 3.87 - 5.11 MIL/uL   Hemoglobin 10.9 (L) 12.0 - 15.0 g/dL   HCT 35.4 (L) 36.0 - 46.0 %   MCV 87.6 80.0 - 100.0 fL   MCH 27.0 26.0 - 34.0 pg   MCHC 30.8 30.0 - 36.0 g/dL   RDW 16.0 (H) 11.5 - 15.5 %   Platelets 260 150 - 400 K/uL   nRBC 0.0 0.0 - 0.2 %    Comment: Performed at [facility], [place] [address] [place], [place] [address]  Comprehensive metabolic panel     Status: Abnormal   Collection Time: 10/31/18  3:30 PM  Result Value Ref Range   Sodium 139 135 - 145 mmol/L   Potassium 3.8 3.5 - 5.1 mmol/L    Chloride 106 98 - 111 mmol/L   CO2 23 22 - 32 mmol/L   Glucose, Bld 208 (H) 70 - 99 mg/dL   BUN 38 (H) 8 - 23 mg/dL   Creatinine, Ser 1.34 (H) 0.44 - 1.00 mg/dL   Calcium 9.4 8.9 - 10.3 mg/dL   Total Protein 6.2 (L) 6.5 - 8.1 g/dL   Albumin 3.0 (L) 3.5 - 5.0 g/dL   AST 14 (L) 15 - 41 U/L   ALT 16 0 - 44 U/L   Alkaline Phosphatase 69 38 - 126 U/L   Total Bilirubin 0.5 0.3 - 1.2 mg/dL   GFR calc non Af Amer 42 (L) >60 mL/min   GFR calc Af Amer 49 (L) >60 mL/min   Anion gap 10 5 - 15    Comment: Performed at [facility], [place] [address] [place], [place] [address]  Troponin I (High Sensitivity)     Status: None   Collection Time: 10/31/18  3:30 PM  Result Value Ref Range   Troponin I (High Sensitivity) 12 <18 ng/L    Comment: (NOTE) Elevated high sensitivity troponin I (hsTnI) values and significant  changes across serial measurements may suggest ACS but many other  chronic and acute conditions are known to elevate hsTnI results.  Refer to the "Links" section for chest pain algorithms and additional  guidance. Performed at [facility], [place] [address], [place], [place] [address]   SARS Coronavirus 2 (CEPHEID - Performed in [facility] hospital lab), Hosp Order     Status: None   Collection Time: 10/31/18  3:36 PM   Specimen: Nasopharyngeal Swab  Result Value Ref Range   SARS Coronavirus 2 NEGATIVE NEGATIVE    Comment: (NOTE) If result is NEGATIVE SARS-CoV-2 target nucleic acids are NOT DETECTED. The SARS-CoV-2 RNA is generally detectable in upper and lower  respiratory specimens during the acute phase of infection. The lowest  concentration of SARS-CoV-2 viral copies this assay can detect is 250  copies / mL. A negative result does not preclude SARS-CoV-2 infection  and should not be used as the sole basis for treatment or other  patient management decisions.  A negative result may occur with  improper specimen collection / handling, submission of specimen other   than nasopharyngeal swab, presence of viral mutation(s) within the  areas targeted by this assay, and inadequate number of viral copies  (<250 copies / mL). A negative result must be combined  with clinical  observations, patient history, and epidemiological information. If result is POSITIVE SARS-CoV-2 target nucleic acids are DETECTED. The SARS-CoV-2 RNA is generally detectable in upper and lower  respiratory specimens dur ing the acute phase of infection.  Positive  results are indicative of active infection with SARS-CoV-2.  Clinical  correlation with patient history and other diagnostic information is  necessary to determine patient infection status.  Positive results do  not rule out bacterial infection or co-infection with other viruses. If result is PRESUMPTIVE POSTIVE SARS-CoV-2 nucleic acids MAY BE PRESENT.   A presumptive positive result was obtained on the submitted specimen  and confirmed on repeat testing.  While 2019 novel coronavirus  (SARS-CoV-2) nucleic acids may be present in the submitted sample  additional confirmatory testing may be necessary for epidemiological  and / or clinical management purposes  to differentiate between  SARS-CoV-2 and other Sarbecovirus currently known to infect humans.  If clinically indicated additional testing with an alternate test  methodology [phone]) is advised. The SARS-CoV-2 RNA is generally  detectable in upper and lower respiratory sp ecimens during the acute  phase of infection. The expected result is Negative. Fact Sheet for Patients:  StrictlyIdeas.no Fact Sheet for Healthcare Providers: BankingDealers.co.za This test is not yet approved or cleared by the [place] FDA and has been authorized for detection and/or diagnosis of SARS-CoV-2 by FDA under an Emergency Use Authorization (EUA).  This EUA will remain in effect (meaning this test can be used) for the duration of  the COVID-19 declaration under Section 564(b)(1) of the Act, 21 U.S.C. section 360bbb-3(b)(1), unless the authorization is terminated or revoked sooner. Performed at [facility], [place] [address] [place], [place] [address]   CBG monitoring, ED     Status: Abnormal   Collection Time: 10/31/18  4:20 PM  Result Value Ref Range   Glucose-Capillary 212 (H) 70 - 99 mg/dL  Troponin I (High Sensitivity)     Status: None   Collection Time: 10/31/18  5:45 PM  Result Value Ref Range   Troponin I (High Sensitivity) 12 <18 ng/L    Comment: (NOTE) Elevated high sensitivity troponin I (hsTnI) values and significant  changes across serial measurements may suggest ACS but many other  chronic and acute conditions are known to elevate hsTnI results.  Refer to the "Links" section for chest pain algorithms and additional  guidance. Performed at [facility], [place] [address] [place], [place] [address]   POCT I-Stat EG7     Status: Abnormal   Collection Time: 10/31/18  6:25 PM  Result Value Ref Range   pH, Ven 7.331 7.250 - 7.430   pCO2, Ven 51.9 44.0 - 60.0 mmHg   pO2, Ven 53.0 (H) 32.0 - 45.0 mmHg   Bicarbonate 27.4 20.0 - 28.0 mmol/L   TCO2 29 22 - 32 mmol/L   O2 Saturation 84.0 %   Acid-Base Excess 1.0 0.0 - 2.0 mmol/L   Sodium 138 135 - 145 mmol/L   Potassium 4.0 3.5 - 5.1 mmol/L   Calcium, Ion 1.26 1.15 - 1.40 mmol/L   HCT 40.0 36.0 - 46.0 %   Hemoglobin 13.6 12.0 - 15.0 g/dL   Patient temperature HIDE    Sample type VENOUS   CBC with Differential/Platelet     Status: Abnormal   Collection Time: 10/31/18  8:30 PM  Result Value Ref Range   WBC 7.5 4.0 - 10.5 K/uL   RBC 3.99 3.87 - 5.11 MIL/uL  Hemoglobin 10.9 (L) 12.0 - 15.0 g/dL   HCT 35.5 (L) 36.0 - 46.0 %   MCV 89.0 80.0 - 100.0 fL   MCH 27.3 26.0 - 34.0 pg   MCHC 30.7 30.0 - 36.0 g/dL   RDW 16.1 (H) 11.5 - 15.5 %   Platelets 259 150 - 400 K/uL   nRBC 0.3 (H) 0.0 - 0.2 %   Neutrophils Relative % 86 %   Neutro Abs  6.5 1.7 - 7.7 K/uL   Lymphocytes Relative 8 %   Lymphs Abs 0.6 (L) 0.7 - 4.0 K/uL   Monocytes Relative 4 %   Monocytes Absolute 0.3 0.1 - 1.0 K/uL   Eosinophils Relative 1 %   Eosinophils Absolute 0.1 0.0 - 0.5 K/uL   Basophils Relative 0 %   Basophils Absolute 0.0 0.0 - 0.1 K/uL   Immature Granulocytes 1 %   Abs Immature Granulocytes 0.06 0.00 - 0.07 K/uL    Comment: Performed at [facility] [address] [place], [place] [address]  Fibrinogen     Status: Abnormal   Collection Time: 10/31/18  8:56 PM  Result Value Ref Range   Fibrinogen 731 (H) 210 - 475 mg/dL    Comment: Performed at [facility] [address], [place], [place] [address]  Ferritin     Status: None   Collection Time: 10/31/18  8:56 PM  Result Value Ref Range   Ferritin 77 11 - 307 ng/mL    Comment: Performed at [facility] [address] [place], [place] [address]  Lactate dehydrogenase     Status: Abnormal   Collection Time: 10/31/18  8:56 PM  Result Value Ref Range   LDH 204 (H) 98 - 192 U/L    Comment: Performed at [facility] [address] [place], [place] [address]  Protime-INR     Status: None   Collection Time: 10/31/18  8:56 PM  Result Value Ref Range   Prothrombin Time 13.6 11.4 - 15.2 seconds   INR 1.1 0.8 - 1.2    Comment: (NOTE) INR goal varies based on device and disease states. Performed at [facility], [place] [address] [place], [place] [address]   C-reactive protein     Status: None   Collection Time: 10/31/18  8:56 PM  Result Value Ref Range   CRP <0.8 <1.0 mg/dL    Comment: Performed at [facility], [place] [address], [place], [place] [address]  Basic metabolic panel     Status: Abnormal   Collection Time: 11/01/18  1:51 AM  Result Value Ref Range   Sodium 140 135 - 145 mmol/L   Potassium 3.9 3.5 - 5.1 mmol/L   Chloride 104 98 - 111 mmol/L   CO2 23 22 - 32 mmol/L   Glucose, Bld 319 (H) 70 - 99 mg/dL   BUN 41 (H) 8 - 23 mg/dL    Creatinine, Ser 1.51 (H) 0.44 - 1.00 mg/dL   Calcium 9.5 8.9 - 10.3 mg/dL   GFR calc non Af Amer 36 (L) >60 mL/min   GFR calc Af Amer 42 (L) >60 mL/min   Anion gap 13 5 - 15    Comment: Performed at [facility] [address], [place], [place] [address]  CBC     Status: Abnormal   Collection Time: 11/01/18  1:51 AM  Result Value Ref Range   WBC 8.1 4.0 - 10.5 K/uL   RBC 4.34  3.87 - 5.11 MIL/uL   Hemoglobin 11.8 (L) 12.0 - 15.0 g/dL   HCT 37.8 36.0 - 46.0 %   MCV 87.1 80.0 - 100.0 fL   MCH 27.2 26.0 - 34.0 pg   MCHC 31.2 30.0 - 36.0 g/dL   RDW 16.0 (H) 11.5 - 15.5 %   Platelets 294 150 - 400 K/uL   nRBC 0.0 0.0 - 0.2 %    Comment: Performed at [facility] [address] [place], [place] [address]  CBG monitoring, ED     Status: Abnormal   Collection Time: 11/01/18  2:28 AM  Result Value Ref Range   Glucose-Capillary 270 (H) 70 - 99 mg/dL  CBG monitoring, ED     Status: Abnormal   Collection Time: 11/01/18  9:33 AM  Result Value Ref Range   Glucose-Capillary 309 (H) 70 - 99 mg/dL   Comment 1 Notify RN    Comment 2 Document in Chart    Dg Chest Port 1 View  Result Date: 10/31/2018 CLINICAL DATA:  Shortness of breath and chest pain EXAM: PORTABLE CHEST 1 VIEW COMPARISON:  September 30, 2018 FINDINGS: There is airspace consolidation in the left lower lobe with small left pleural effusion. There is mild atelectatic change in each lower lobe. Heart is borderline enlarged with mild pulmonary venous hypertension. No adenopathy. No bone lesions. IMPRESSION: 1. Airspace consolidation consistent with pneumonia left base with small left pleural effusion. 2.  Lower lobe atelectatic change bilaterally. 3.  Pulmonary vascular congestion. Electronically Signed   By: Lowella Grip III M.D.   On: 10/31/2018 15:49    Pending Labs Unresulted Labs (From admission, onward)    Start     Ordered   11/07/18 0500  Creatinine, serum  (enoxaparin (LOVENOX)    CrCl >/= 30 ml/min)  Weekly,    R    Comments: while on enoxaparin therapy    11/01/18 0029   11/01/18 6712  Basic metabolic panel  Daily,   R     11/01/18 0029   11/01/18 0030  HIV antibody (Routine Screening)  Once,   STAT     11/01/18 0029   11/01/18 0030  Strep pneumoniae urinary antigen  Once,   STAT     11/01/18 0029          Vitals/Pain Today's Vitals   11/01/18 0935 11/01/18 1000 11/01/18 1100 11/01/18 1145  BP: 110/74 (!) 172/78 (!) 166/78 (!) 166/78  Pulse: 91 95 95 90  Resp: (!) 28 (!) 28 (!) 24 (!) 26  Temp:      TempSrc:      SpO2: 97% 95% 96% 97%  Weight:      Height:      PainSc:        Isolation Precautions No active isolations  Medications Medications  allopurinol (ZYLOPRIM) tablet 300 mg (300 mg Oral Given 11/01/18 1145)  aspirin EC tablet 81 mg (81 mg Oral Given 11/01/18 1146)  amLODipine (NORVASC) tablet 5 mg (5 mg Oral Given 11/01/18 0233)  atorvastatin (LIPITOR) tablet 80 mg (has no administration in time range)  carvedilol (COREG) tablet 12.5 mg (12.5 mg Oral Given 11/01/18 1146)  DULoxetine (CYMBALTA) DR capsule 60 mg (60 mg Oral Given 11/01/18 1146)  insulin glargine (LANTUS) injection 5 Units (5 Units Subcutaneous Given 11/01/18 0231)  pantoprazole (PROTONIX) EC tablet 40 mg (40 mg Oral Given 11/01/18 1147)  ondansetron (ZOFRAN) tablet 4 mg (has no administration in time range)  polyethylene glycol (MIRALAX / GLYCOLAX)  packet 17 g (17 g Oral Given 11/01/18 1149)  gabapentin (NEURONTIN) capsule 300 mg (300 mg Oral Given 11/01/18 1147)  insulin aspart (novoLOG) injection 0-9 Units (7 Units Subcutaneous Given 11/01/18 1000)  insulin aspart (novoLOG) injection 0-5 Units (0 Units Subcutaneous Not Given 11/01/18 0344)  furosemide (LASIX) injection 40 mg (40 mg Intravenous Given 11/01/18 0240)  enoxaparin (LOVENOX) injection 40 mg (has no administration in time range)  cefTRIAXone (ROCEPHIN) 2 g in sodium chloride 0.9 % 100 mL IVPB (0 g Intravenous Stopped 11/01/18 0326)  azithromycin  (ZITHROMAX) tablet 500 mg (500 mg Oral Given 11/01/18 1147)  furosemide (LASIX) injection 60 mg (60 mg Intravenous Given 10/31/18 1548)  cefTRIAXone (ROCEPHIN) 1 g in sodium chloride 0.9 % 100 mL IVPB (0 g Intravenous Stopped 10/31/18 1823)  azithromycin (ZITHROMAX) 500 mg in sodium chloride 0.9 % 250 mL IVPB (0 mg Intravenous Stopped 11/01/18 0326)  labetalol (NORMODYNE) injection 20 mg (20 mg Intravenous Given 10/31/18 1827)  labetalol (NORMODYNE) injection 20 mg (20 mg Intravenous Given 10/31/18 2041)    Mobility walks Moderate fall risk   Focused Assessments   R Recommendations: See Admitting Provider Note  Report given to:   Additional Notes:

## 2018-11-01 NOTE — Progress Notes (Signed)
  Echocardiogram 2D Echocardiogram has been performed.  Kara Hanson 11/01/2018, 9:12 AM

## 2018-11-01 NOTE — ED Notes (Signed)
Lunch tray ordered 

## 2018-11-01 NOTE — Plan of Care (Signed)
  Problem: Education: Goal: Knowledge of General Education information will improve Description: Including pain rating scale, medication(s)/side effects and non-pharmacologic comfort measures Outcome: Progressing   Problem: Education: Goal: Ability to demonstrate management of disease process will improve Outcome: Progressing Goal: Ability to verbalize understanding of medication therapies will improve Outcome: Progressing Goal: Individualized Educational Video(s) Outcome: Progressing   Problem: Activity: Goal: Capacity to carry out activities will improve Outcome: Progressing   Problem: Cardiac: Goal: Ability to achieve and maintain adequate cardiopulmonary perfusion will improve Outcome: Progressing   Problem: Activity: Goal: Ability to tolerate increased activity will improve Outcome: Progressing   Problem: Clinical Measurements: Goal: Ability to maintain a body temperature in the normal range will improve Outcome: Progressing   Problem: Respiratory: Goal: Ability to maintain adequate ventilation will improve Outcome: Progressing Goal: Ability to maintain a clear airway will improve Outcome: Progressing

## 2018-11-01 NOTE — ED Notes (Signed)
Called kitchen to inform them pt going to room upstairs to send lunch tray to [facility]

## 2018-11-01 NOTE — ED Notes (Signed)
New Purewick hooked up to suction.

## 2018-11-01 NOTE — Progress Notes (Addendum)
Progress Note    Kara Hanson DGU:[id number] DOB: 12-08-1955 DOA: 10/31/2018  PCP: [name], MD  Patient coming from: Home  Chief Complaint: SOB  HPI: Kara Hanson is a 63 y.o. female with medical history significant of CAD, spinal stenosis, HTN, HLD, IDDM type 2, CVA, HFpEF (EF of 50-55% last December) who presents for worsening SOB.  She notes that the symptoms started 2 days ago and then became unbearable today.  She had increased respiratory rate and had to increase her oxygen use.  She has had this happen before, so over the last couple of days, she increased her lasix from 35m daily to 858mdaily, but her UOP stayed low.  She has had no interest in eating and for the last day has had a cough  She reports staying to a low salt diet. [facility]  She had a headache on admission, but this has improved.  She further reports swelling of her LE which has gotten worse.  She denies fever, sputum production, chest pain, change in diet, dysuria, abdominal pain, nausea, vomiting, diarrhea.  She has been taking all of her medications. She was admitted last month for an acute heart failure exacerbation.  She was noted to be COVID-19 positive at that time, but felt to be Asymptomatic.  She had further been admitted in April of this year with COVID-19 pneumonia [facility] and was [facility] on [facility].  She did well with diuresis during both hospital stays.  Today, her COVID-19 test was negative.  Her CXR showed a left lower consolidation and pleural effusion with pulmonary vascular congestion as well.  She tells me that she has a long history of frequent pneumonias since she was a child and that her last one was 1 month ago. In the ED, she was treated with lasix and she reports good urine output.  Labs revealed a normal WBC, with a mildly low H/H which are stable.  Renal function of 38/1.34 (baseline Cr 1.1-1.3), glucose 208.  VBG showed a normal pH with a high O2.   She is now on 4LNC.  Subjective: No acute issues or events overnight, patient continues to feel somewhat dyspneic even with minimal exertion, denies chest pain, nausea, vomiting, diarrhea, constipation, headache, fevers, chills.  Assessment/Plan Active Problems:   Hypertension   Type 2 diabetes mellitus with diabetic neuropathy, unspecified (HCC)   HLD (hyperlipidemia)   Acute on chronic diastolic CHF (congestive heart failure) (HCC)   Acute and chronic respiratory failure with hypoxia (HCC)   CKD (chronic kidney disease), stage III [place]  CAP (community acquired pneumonia)  Acute hypoxic respiratory failure in the setting of CAP (community acquired pneumonia), POA -Previously treated for COVID in April, remained positive in June [facility] 2[facility] now requiring upwards of 4-5L [place] even at rest with moderate dyspnea with ambulation -Patient remains afebrile, without leukocytosis, likely post viral bacterial infection -Imaging shows bilateral opacifications concerning for bibasilar bacterial community-acquired pneumonia given above, continue azithromycin and ceftriaxone for at least 5-day minimum coverage, pending local improvement could consider full 7-day course  Questionably concurrent acute on chronic diastolic CHF (congestive heart failure)  Diastolic dysfunction EF 50 to 55% December 2019 -Repeat echo pending formal evaluation, performed this morning -Continue IV lasix 4086mID  - Strict I/O-Daily weight  Hypertension, essential, well-controlled -Continue home medications, treat as necessary    Type 2 diabetes mellitus with  diabetic neuropathy - Continue long-acting and sliding scale insulin, hypoglycemic protocol    HLD (hyperlipidemia) - Continue atorvastatin    CKD (chronic kidney disease), stage III  - Renal function appears to be at baseline and improved from last admission  Normocytic anemia - At baseline, likely related to  renal disease - Ferritin is being checked as an inflammatory marker  History of spinal stenosis - Continue gabapentin, duloxetine     DVT prophylaxis: Lovenox  Code Status: Full  Disposition Plan: Admit for further diuresis, antibiotics Consults called: None  Admission status: Med Tele, INP    Prior to Admission medications   Medication Sig Start Date End Date Taking? Authorizing Provider  Accu-Chek FastClix Lancets MISC USE AS DIRECTED TO TEST BLOOD SUGAR THREE TIMES DAILY 07/31/18   [name], MD  allopurinol (ZYLOPRIM) 300 MG tablet Take 1 tablet (300 mg total) by mouth daily. 09/17/18   [name], MD  amLODipine (NORVASC) 5 MG tablet TAKE 1 TABLET (5 MG TOTAL) BY MOUTH AT BEDTIME. 09/01/18 10/22/18  [name], MD  aspirin EC 81 MG tablet Take 81 mg by mouth daily.    [provider]  atorvastatin (LIPITOR) 80 MG tablet Take 1 tablet (80 mg total) by mouth daily at 6 PM for 30 days. 09/17/18 10/31/18  [name], MD  Blood Glucose Monitoring Suppl (ACCU-CHEK GUIDE) w/Device KIT 1 each by Does not apply route 3 (three) times daily. 07/31/18   [name], MD  carvedilol (COREG) 12.5 MG tablet Take 1 tablet (12.5 mg total) by mouth 2 (two) times daily with a meal. 09/17/18   [name], MD  DULoxetine (CYMBALTA) 60 MG capsule Take 1 capsule (60 mg total) by mouth daily. For chronic back and neck pain 08/07/18   [name], MD  EASY COMFORT PEN NEEDLES 31G X 5 MM MISC USE FOUR TIMES PER DAY FOR INSULIN ADMINISTRATION Patient taking differently: 4 (four) times daily.  10/02/18   [name], MD  ergocalciferol (DRISDOL) 1.25 MG (50000 UT) capsule Take 1 capsule (50,000 Units total) by mouth once a week. 09/18/18   [name], MD  furosemide (LASIX) 40 MG tablet Take 1 tablet (40 mg total) by mouth daily. 10/03/18   Bonnielee Haff, MD  gabapentin (NEURONTIN) 300 MG capsule TAKE 1 CAPSULE (300 MG TOTAL) BY MOUTH 2 (TWO) TIMES DAILY. 09/03/18 10/31/18   [name], MD  glucose blood (ACCU-CHEK GUIDE) test strip USE AS DIRECTED TO TEST BLOOD SUGAR THREE TIMES DAILY 07/31/18   [name], MD  insulin aspart (NOVOLOG) 100 UNIT/ML injection 0 to 12 units subcutaneously 3 times daily before meals as per sliding scale 03/29/18   [name], MD  Insulin Glargine (BASAGLAR KWIKPEN) 100 UNIT/ML SOPN Inject 0.05 mLs (5 Units total) into the skin at bedtime. 10/02/18   Bonnielee Haff, MD  Lancet Device MISC Use as instructed 3 times daily 09/19/17   Renita Papa, PA-C  Misc. Devices MISC Portable oxygen concentrator.  Diagnosis-chronic respiratory failure. 09/03/18   [name], MD  Misc. Devices MISC [place] with seat. Dx: Congestive Heart Failure 10/19/18   [name], MD  omeprazole (PRILOSEC) 20 MG capsule Take 20 mg by mouth daily.    [provider]  ondansetron (ZOFRAN) 4 MG tablet Take 1 tablet (4 mg total) by mouth every 6 (six) hours as needed for nausea. 07/25/18   Charlynne Cousins, MD  polyethylene glycol (MIRALAX / GLYCOLAX) 17 g packet Take 17 g by mouth daily. 10/02/18  Bonnielee Haff, MD  SUPER B COMPLEX/C CAPS Take 1 capsule by mouth daily.    [provider]  tiZANidine (ZANAFLEX) 4 MG tablet TAKE 1 TABLET (4 MG TOTAL) BY MOUTH EVERY 8 (EIGHT) HOURS AS NEEDED FOR MUSCLE SPASMS. 09/01/18   [name], MD    Physical Exam: Constitutional: Sitting straight up in bed, Tachypneic Vitals:   11/01/18 0935 11/01/18 1000 11/01/18 1100 11/01/18 1145  BP: 110/74 (!) 172/78 (!) 166/78 (!) 166/78  Pulse: 91 95 95 90  Resp: (!) 28 (!) 28 (!) 24 (!) 26  Temp:      TempSrc:      SpO2: 97% 95% 96% 97%  Weight:      Height:       Eyes:  lids and conjunctivae normal, no icterus ENMT: MMM, JVD not appreciated given patients positioning Neck: normal, supple Respiratory: Course breath sounds throughout, mild expiratory wheezing throughout, decreased breath sounds at bases, worse on the left.     Cardiovascular: RR, difficult to appreciate sounds given upper airway sounds.  She has 1-2+ pitting edema to mid calf, she has palpable radial and DP pulses bilaterally.   Abdomen: +BS, NT, ND Musculoskeletal: no clubbing / cyanosis. Normal muscle tone.  Skin: no rashes, lesions, ulcers. She has a dime sized macule on chest which is old.  Neurologic: Grossly intact, moving all extremities, sensation intact to light touch.  Psychiatric: Normal judgment and insight.   Labs on Admission: I have personally reviewed following labs and imaging studies  CBC: Recent Labs  Lab 10/31/18 1530 10/31/18 1825 10/31/18 2030 11/01/18 0151  WBC 7.6  --  7.5 8.1  NEUTROABS  --   --  6.5  --   HGB 10.9* 13.6 10.9* 11.8*  HCT 35.4* 40.0 35.5* 37.8  MCV 87.6  --  89.0 87.1  PLT 260  --  259 983   Basic Metabolic Panel: Recent Labs  Lab 10/31/18 1530 10/31/18 1825 11/01/18 0151  NA 139 138 140  K 3.8 4.0 3.9  CL 106  --  104  CO2 23  --  23  GLUCOSE 208*  --  319*  BUN 38*  --  41*  CREATININE 1.34*  --  1.51*  CALCIUM 9.4  --  9.5   GFR: Estimated Creatinine Clearance: 33.1 mL/min (A) (by C-G formula based on SCr of 1.51 mg/dL (H)). Liver Function Tests: Recent Labs  Lab 10/31/18 1530  AST 14*  ALT 16  ALKPHOS 69  BILITOT 0.5  PROT 6.2*  ALBUMIN 3.0*   No results for input(s): LIPASE, AMYLASE in the last 168 hours. No results for input(s): AMMONIA in the last 168 hours. Coagulation Profile: Recent Labs  Lab 10/31/18 2056  INR 1.1   Cardiac Enzymes: No results for input(s): CKTOTAL, CKMB, CKMBINDEX, TROPONINI in the last 168 hours. BNP (last 3 results) No results for input(s): PROBNP in the last 8760 hours. HbA1C: No results for input(s): HGBA1C in the last 72 hours. CBG: Recent Labs  Lab 10/31/18 1620 11/01/18 0228 11/01/18 0933  GLUCAP 212* 270* 309*   Lipid Profile: No results for input(s): CHOL, HDL, LDLCALC, TRIG, CHOLHDL, LDLDIRECT in the last 72  hours. Thyroid Function Tests: No results for input(s): TSH, T4TOTAL, FREET4, T3FREE, THYROIDAB in the last 72 hours. Anemia Panel: Recent Labs    10/31/18 2056  FERRITIN 77   Urine analysis:    Component Value Date/Time   COLORURINE YELLOW 01/02/2018 1452   APPEARANCEUR HAZY (A) 01/02/2018 1452  LABSPEC 1.039 (H) 01/02/2018 1452   PHURINE 6.0 01/02/2018 1452   GLUCOSEU >=500 (A) 01/02/2018 1452   HGBUR NEGATIVE 01/02/2018 1452   BILIRUBINUR NEGATIVE 01/02/2018 1452   BILIRUBINUR negative 12/13/2017 [place] 01/02/2018 1452   PROTEINUR >=300 (A) 01/02/2018 1452   UROBILINOGEN 0.2 12/13/2017 1514   NITRITE NEGATIVE 01/02/2018 1452   LEUKOCYTESUR NEGATIVE 01/02/2018 1452    Radiological Exams on Admission: Dg Chest Port 1 View  Result Date: 10/31/2018 CLINICAL DATA:  Shortness of breath and chest pain EXAM: PORTABLE CHEST 1 VIEW COMPARISON:  September 30, 2018 FINDINGS: There is airspace consolidation in the left lower lobe with small left pleural effusion. There is mild atelectatic change in each lower lobe. Heart is borderline enlarged with mild pulmonary venous hypertension. No adenopathy. No bone lesions. IMPRESSION: 1. Airspace consolidation consistent with pneumonia left base with small left pleural effusion. 2.  Lower lobe atelectatic change bilaterally. 3.  Pulmonary vascular congestion. Electronically Signed   By: Lowella Grip III M.D.   On: 10/31/2018 15:49    EKG: Independently reviewed. TWI I, II, III, aVF, laterally, appears unchanged from June.  No ST changes  Little Ishikawa MD [facility]  If 7PM-7AM, please contact night-coverage [email or web address] Password [email or web address]  11/01/2018, 12:44 PM

## 2018-11-02 LAB — CBC
HCT: 33.1 % — ABNORMAL LOW (ref 36.0–46.0)
Hemoglobin: 10.2 g/dL — ABNORMAL LOW (ref 12.0–15.0)
MCH: 27.3 pg (ref 26.0–34.0)
MCHC: 30.8 g/dL (ref 30.0–36.0)
MCV: 88.7 fL (ref 80.0–100.0)
Platelets: 280 10*3/uL (ref 150–400)
RBC: 3.73 MIL/uL — ABNORMAL LOW (ref 3.87–5.11)
RDW: 16.6 % — ABNORMAL HIGH (ref 11.5–15.5)
WBC: 8.3 10*3/uL (ref 4.0–10.5)
nRBC: 0 % (ref 0.0–0.2)

## 2018-11-02 LAB — BASIC METABOLIC PANEL
Anion gap: 10 (ref 5–15)
BUN: 56 mg/dL — ABNORMAL HIGH (ref 8–23)
CO2: 27 mmol/L (ref 22–32)
Calcium: 9.3 mg/dL (ref 8.9–10.3)
Chloride: 104 mmol/L (ref 98–111)
Creatinine, Ser: 1.62 mg/dL — ABNORMAL HIGH (ref 0.44–1.00)
GFR calc Af Amer: 39 mL/min — ABNORMAL LOW (ref >60)
GFR calc non Af Amer: 33 mL/min — ABNORMAL LOW (ref >60)
Glucose, Bld: 142 mg/dL — ABNORMAL HIGH (ref 70–99)
Potassium: 3.8 mmol/L (ref 3.5–5.1)
Sodium: 141 mmol/L (ref 135–145)

## 2018-11-02 LAB — GLUCOSE, CAPILLARY
Glucose-Capillary: 131 mg/dL — ABNORMAL HIGH (ref 70–99)
Glucose-Capillary: 161 mg/dL — ABNORMAL HIGH (ref 70–99)
Glucose-Capillary: 102 mg/dL — ABNORMAL HIGH (ref 70–99)
Glucose-Capillary: 114 mg/dL — ABNORMAL HIGH (ref 70–99)

## 2018-11-02 MED ORDER — SODIUM CHLORIDE 0.9 % IV SOLN
INTRAVENOUS | Status: DC | PRN
  Administered 2018-11-02: 250 mL via INTRAVENOUS

## 2018-11-02 NOTE — Progress Notes (Signed)
Nutrition Brief Note  Patient identified on the Malnutrition Screening Tool (MST) Report  Wt Readings from Last 15 Encounters:  11/02/18 74.1 kg  10/22/18 74.4 kg  10/02/18 71 kg  09/17/18 71.6 kg  07/20/18 68 kg  03/29/18 78.8 kg  03/24/18 77.3 kg  01/02/18 80.5 kg  12/13/17 80.5 kg  11/29/17 78.1 kg  11/14/17 77.4 kg  09/28/17 74.8 kg  10/01/16 79.7 kg  06/18/16 79.2 kg  05/19/16 75.8 kg   Kara Hanson is a 63 y.o. female with medical history significant of CAD, spinal stenosis, HTN, HLD, IDDM type 2, CVA, HFpEF (EF of 50-55% last December) who presents for worsening SOB.  She notes that the symptoms started 2 days ago and then became unbearable today.  She had increased respiratory rate and had to increase her oxygen use.  She has had this happen before, so over the last couple of days, she increased her lasix from 40mg  daily to 80mg  daily, but her UOP stayed low.  She has had no interest in eating and for the last day has had a cough  She reports staying to a low salt diet. She is on [place] at home.  She had a headache on admission, but this has improved.  She further reports swelling of her LE which has gotten worse.  She denies fever, sputum production, chest pain, change in diet, dysuria, abdominal pain, nausea, vomiting, diarrhea.  She has been taking all of her medications.  Pt admitted with CAP.   Pt receiving nursing care and eating lunch at time of visit.   Nutrition-Focused physical exam completed. Findings are no fat depletion, no muscle depletion, and no edema.   Lab Results  Component Value Date   HGBA1C 7.2 (A) 09/17/2018   PTA DM medications are 5 units insulin glargine and 0-12 units insulin aspart TID.   Labs reviewed: CBGS: 131-186 (inpatient orders for glycemic control are 0-5 units insulin aspart q HS, 0-9 units insulin aspart TID with meals, and 5 units insulin glargine q HS).   Body mass index is 32.98 kg/m. Patient meets criteria for obesity class I  based on current BMI.   Current diet order is heart healthy/ carb modified, patient is consuming approximately 100% of meals at this time. Labs and medications reviewed.   No nutrition interventions warranted at this time. If nutrition issues arise, please consult RD.   Kara Hanson, RD, LDN, [place] Registered Dietitian II Certified Diabetes Care and Education Specialist Pager: [phone] After hours Pager: [phone]

## 2018-11-02 NOTE — Care Management Important Message (Signed)
Important Message  Patient Details  Name: Kara Hanson MRN: [id number] Date of Birth: 19-Jul-1955   Medicare Important Message Given:  Yes     [name] 11/02/2018, 12:31 PM

## 2018-11-02 NOTE — Progress Notes (Signed)
Progress Note    Kara Hanson CHY:[id number] DOB: 1955/08/11 DOA: 10/31/2018  PCP: [name], MD  Patient coming from: Home  Chief Complaint: SOB  HPI: Kara Hanson is a 63 y.o. female with medical history significant of CAD, spinal stenosis, HTN, HLD, IDDM type 2, CVA, HFpEF (EF of 50-55% last December) who presents for worsening SOB.  She notes that the symptoms started 2 days ago and then became unbearable today.  She had increased respiratory rate and had to increase her oxygen use.  She has had this happen before, so over the last couple of days, she increased her lasix from 77m daily to 844mdaily, but her UOP stayed low.  She has had no interest in eating and for the last day has had a cough  She reports staying to a low salt diet. [facility]  She had a headache on admission, but this has improved.  She further reports swelling of her LE which has gotten worse.  She denies fever, sputum production, chest pain, change in diet, dysuria, abdominal pain, nausea, vomiting, diarrhea.  She has been taking all of her medications. She was admitted last month for an acute heart failure exacerbation.  She was noted to be COVID-19 positive at that time, but felt to be Asymptomatic.  She had further been admitted in April of this year with COVID-19 pneumonia [facility] and was [facility] on [facility].  She did well with diuresis during both hospital stays.  Today, her COVID-19 test was negative.  Her CXR showed a left lower consolidation and pleural effusion with pulmonary vascular congestion as well.  She tells me that she has a long history of frequent pneumonias since she was a child and that her last one was 1 month ago. In the ED, she was treated with lasix and she reports good urine output.  Labs revealed a normal WBC, with a mildly low H/H which are stable.  Renal function of 38/1.34 (baseline Cr 1.1-1.3), glucose 208.  VBG showed a normal pH with a high O2.   She is now on 4LNC.  Subjective: No acute issues or events overnight, patient continues to feel somewhat dyspneic even with even minimal exertion, denies chest pain, nausea, vomiting, diarrhea, constipation, headache, fevers, chills.  Assessment/Plan Active Problems:   Hypertension   Type 2 diabetes mellitus with diabetic neuropathy, unspecified (HCC)   HLD (hyperlipidemia)   Acute on chronic diastolic CHF (congestive heart failure) (HCC)   Acute and chronic respiratory failure with hypoxia (HCC)   [place]D (chronic kidney disease), stage III [place]  CAP (community acquired pneumonia)  Acute hypoxic respiratory failure in the setting of CAP (community acquired pneumonia), POA -Previously treated for COVID in April, remained positive in June discharged on 2-[facility] this is not a chronic oxygen need. -Patient now requiring upwards of 4-5L [place] even at rest with moderate dyspnea with ambulation -Patient remains afebrile, without leukocytosis, likely post viral bacterial infection -Imaging shows bilateral opacifications concerning for bibasilar bacterial community-acquired pneumonia given above, continue azithromycin and ceftriaxone for at least 5-day minimum coverage, pending clinical improvement could consider full 7-day course if not improving  Questionably concurrent acute on chronic diastolic CHF (congestive heart failure)  Diastolic dysfunction EF 50 to 55% December 2019 - Repeat echo pending formal evaluation, performed this morning - Continue IV lasix 4052mID  - Strict I/O-Daily weight  Hypertension, essential, well-controlled -  Continue home medications, treat as necessary    Type 2 diabetes mellitus with diabetic neuropathy - Continue long-acting and sliding scale insulin, hypoglycemic protocol    HLD (hyperlipidemia) - Continue atorvastatin    CKD (chronic kidney disease), stage III  - Renal function appears to be at baseline and improved from last  admission  Normocytic anemia - At baseline, likely related to renal disease - Ferritin is being checked as an inflammatory marker  History of spinal stenosis - Continue gabapentin, duloxetine  DVT prophylaxis: Lovenox  Code Status: Full  Disposition Plan: Admit for further diuresis, antibiotics Consults called: None  Admission status: Med Tele, INP    Prior to Admission medications   Medication Sig Start Date End Date Taking? Authorizing Provider  Accu-Chek FastClix Lancets MISC USE AS DIRECTED TO TEST BLOOD SUGAR THREE TIMES DAILY 07/31/18   [name], MD  allopurinol (ZYLOPRIM) 300 MG tablet Take 1 tablet (300 mg total) by mouth daily. 09/17/18   [name], MD  amLODipine (NORVASC) 5 MG tablet TAKE 1 TABLET (5 MG TOTAL) BY MOUTH AT BEDTIME. 09/01/18 10/22/18  [name], MD  aspirin EC 81 MG tablet Take 81 mg by mouth daily.    [provider]  atorvastatin (LIPITOR) 80 MG tablet Take 1 tablet (80 mg total) by mouth daily at 6 PM for 30 days. 09/17/18 10/31/18  [name], MD  Blood Glucose Monitoring Suppl (ACCU-CHEK GUIDE) w/Device KIT 1 each by Does not apply route 3 (three) times daily. 07/31/18   [name], MD  carvedilol (COREG) 12.5 MG tablet Take 1 tablet (12.5 mg total) by mouth 2 (two) times daily with a meal. 09/17/18   [name], MD  DULoxetine (CYMBALTA) 60 MG capsule Take 1 capsule (60 mg total) by mouth daily. For chronic back and neck pain 08/07/18   [name], MD  EASY COMFORT PEN NEEDLES 31G X 5 MM MISC USE FOUR TIMES PER DAY FOR INSULIN ADMINISTRATION Patient taking differently: 4 (four) times daily.  10/02/18   [name], MD  ergocalciferol (DRISDOL) 1.25 MG (50000 UT) capsule Take 1 capsule (50,000 Units total) by mouth once a week. 09/18/18   [name], MD  furosemide (LASIX) 40 MG tablet Take 1 tablet (40 mg total) by mouth daily. 10/03/18   Bonnielee Haff, MD  gabapentin (NEURONTIN) 300 MG capsule TAKE 1 CAPSULE  (300 MG TOTAL) BY MOUTH 2 (TWO) TIMES DAILY. 09/03/18 10/31/18  [name], MD  glucose blood (ACCU-CHEK GUIDE) test strip USE AS DIRECTED TO TEST BLOOD SUGAR THREE TIMES DAILY 07/31/18   [name], MD  insulin aspart (NOVOLOG) 100 UNIT/ML injection 0 to 12 units subcutaneously 3 times daily before meals as per sliding scale 03/29/18   [name], MD  Insulin Glargine (BASAGLAR KWIKPEN) 100 UNIT/ML SOPN Inject 0.05 mLs (5 Units total) into the skin at bedtime. 10/02/18   Bonnielee Haff, MD  Lancet Device MISC Use as instructed 3 times daily 09/19/17   Renita Papa, PA-C  Misc. Devices MISC Portable oxygen concentrator.  Diagnosis-chronic respiratory failure. 09/03/18   [name], MD  Misc. Devices MISC Rollaor with seat. Dx: Congestive Heart Failure 10/19/18   [name], MD  omeprazole (PRILOSEC) 20 MG capsule Take 20 mg by mouth daily.    [provider]  ondansetron (ZOFRAN) 4 MG tablet Take 1 tablet (4 mg total) by mouth every 6 (six) hours as needed for nausea. 07/25/18   Charlynne Cousins, MD  polyethylene glycol [facility] Flor[facility]ing)  17 g packet Take 17 g by mouth daily. 10/02/18   Bonnielee Haff, MD  SUPER B COMPLEX/C CAPS Take 1 capsule by mouth daily.    [provider]  tiZANidine (ZANAFLEX) 4 MG tablet TAKE 1 TABLET (4 MG TOTAL) BY MOUTH EVERY 8 (EIGHT) HOURS AS NEEDED FOR MUSCLE SPASMS. 09/01/18   [name], MD    Physical Exam: Vitals:   11/02/18 0159 11/02/18 0241 11/02/18 0445 11/02/18 0737  BP: 121/73  119/66 (!) 147/72  Pulse: 77  77 79  Resp: _0 Temp: 97.6 F (36.4 C)  97.7 F (36.5 C) 98.5 F (36.9 C)  TempSrc: Oral  Oral Oral  SpO2: 99%  100% 100%  Weight:  74.1 kg    Height:       General:  Pleasantly resting in bed, No acute distress. HEENT:  Normocephalic atraumatic.  Sclerae nonicteric, noninjected.  Extraocular movements intact bilaterally. Neck:  Without mass or deformity.  Trachea is midline. Lungs:  Clear  to auscultate bilaterally without rhonchi, wheeze, or rales. Heart:  Regular rate and rhythm.  Without murmurs, rubs, or gallops. Abdomen:  Soft, nontender, nondistended.  Without guarding or rebound. Extremities: Without cyanosis, clubbing, edema, or obvious deformity. Vascular:  Dorsalis pedis and posterior tibial pulses palpable bilaterally. Skin:  Warm and dry, no erythema, no ulcerations.  Labs on Admission: I have personally reviewed following labs and imaging studies  CBC: Recent Labs  Lab 10/31/18 1530 10/31/18 1825 10/31/18 2030 11/01/18 0151 11/02/18 0535  WBC 7.6  --  7.5 8.1 8.3  NEUTROABS  --   --  6.5  --   --   HGB 10.9* 13.6 10.9* 11.8* 10.2*  HCT 35.4* 40.0 35.5* 37.8 33.1*  MCV 87.6  --  89.0 87.1 88.7  PLT 260  --  259 294 803   Basic Metabolic Panel: Recent Labs  Lab 10/31/18 1530 10/31/18 1825 11/01/18 0151 11/02/18 0535  NA 139 138 140 141  K 3.8 4.0 3.9 3.8  CL 106  --  104 104  CO2 23  --  23 27  GLUCOSE 208*  --  319* 142*  BUN 38*  --  41* 56*  CREATININE 1.34*  --  1.51* 1.62*  CALCIUM 9.4  --  9.5 9.3   GFR: Estimated Creatinine Clearance: 31.2 mL/min (A) (by C-G formula based on SCr of 1.62 mg/dL (H)). Liver Function Tests: Recent Labs  Lab 10/31/18 1530  AST 14*  ALT 16  ALKPHOS 69  BILITOT 0.5  PROT 6.2*  ALBUMIN 3.0*   No results for input(s): LIPASE, AMYLASE in the last 168 hours. No results for input(s): AMMONIA in the last 168 hours. Coagulation Profile: Recent Labs  Lab 10/31/18 2056  INR 1.1   Cardiac Enzymes: No results for input(s): CKTOTAL, CKMB, CKMBINDEX, TROPONINI in the last 168 hours. BNP (last 3 results) No results for input(s): PROBNP in the last 8760 hours. HbA1C: No results for input(s): HGBA1C in the last 72 hours. CBG: Recent Labs  Lab 11/01/18 0933 11/01/18 1313 11/01/18 1601 11/01/18 2144 11/02/18 0638  GLUCAP 309* 237* 224* 186* 131*   Lipid Profile: No results for input(s): CHOL, HDL,  LDLCALC, TRIG, CHOLHDL, LDLDIRECT in the last 72 hours. Thyroid Function Tests: No results for input(s): TSH, T4TOTAL, FREET4, T3FREE, THYROIDAB in the last 72 hours. Anemia Panel: Recent Labs    10/31/18 2056  FERRITIN 77   Urine analysis:    Component Value Date/Time   COLORURINE YELLOW  01/02/2018 1452   APPEARANCEUR HAZY (A) 01/02/2018 1452   LABSPEC 1.039 (H) 01/02/2018 1452   PHURINE 6.0 01/02/2018 1452   GLUCOSEU >=500 (A) 01/02/2018 1452   HGBUR NEGATIVE 01/02/2018 1452   BILIRUBINUR NEGATIVE 01/02/2018 1452   BILIRUBINUR negative 12/13/2017 [place] 01/02/2018 1452   PROTEINUR >=300 (A) 01/02/2018 1452   UROBILINOGEN 0.2 12/13/2017 1514   NITRITE NEGATIVE 01/02/2018 1452   LEUKOCYTESUR NEGATIVE 01/02/2018 1452    Radiological Exams on Admission: Dg Chest Port 1 View  Result Date: 10/31/2018 CLINICAL DATA:  Shortness of breath and chest pain EXAM: PORTABLE CHEST 1 VIEW COMPARISON:  September 30, 2018 FINDINGS: There is airspace consolidation in the left lower lobe with small left pleural effusion. There is mild atelectatic change in each lower lobe. Heart is borderline enlarged with mild pulmonary venous hypertension. No adenopathy. No bone lesions. IMPRESSION: 1. Airspace consolidation consistent with pneumonia left base with small left pleural effusion. 2.  Lower lobe atelectatic change bilaterally. 3.  Pulmonary vascular congestion. Electronically Signed   By: Lowella Grip III M.D.   On: 10/31/2018 15:49   Little Ishikawa MD [facility]  If 7PM-7AM, please contact night-coverage [email or web address] Password [facility]  11/02/2018, 7:41 AM

## 2018-11-03 LAB — GLUCOSE, CAPILLARY
Glucose-Capillary: 94 mg/dL (ref 70–99)
Glucose-Capillary: 174 mg/dL — ABNORMAL HIGH (ref 70–99)
Glucose-Capillary: 157 mg/dL — ABNORMAL HIGH (ref 70–99)
Glucose-Capillary: 127 mg/dL — ABNORMAL HIGH (ref 70–99)

## 2018-11-03 LAB — CBC
HCT: 31.4 % — ABNORMAL LOW (ref 36.0–46.0)
Hemoglobin: 9.5 g/dL — ABNORMAL LOW (ref 12.0–15.0)
MCH: 27.3 pg (ref 26.0–34.0)
MCHC: 30.3 g/dL (ref 30.0–36.0)
MCV: 90.2 fL (ref 80.0–100.0)
Platelets: 263 10*3/uL (ref 150–400)
RBC: 3.48 MIL/uL — ABNORMAL LOW (ref 3.87–5.11)
RDW: 16 % — ABNORMAL HIGH (ref 11.5–15.5)
WBC: 6.6 10*3/uL (ref 4.0–10.5)
nRBC: 0 % (ref 0.0–0.2)

## 2018-11-03 LAB — BASIC METABOLIC PANEL
Anion gap: 7 (ref 5–15)
BUN: 56 mg/dL — ABNORMAL HIGH (ref 8–23)
CO2: 28 mmol/L (ref 22–32)
Calcium: 9 mg/dL (ref 8.9–10.3)
Chloride: 105 mmol/L (ref 98–111)
Creatinine, Ser: 1.75 mg/dL — ABNORMAL HIGH (ref 0.44–1.00)
GFR calc Af Amer: 35 mL/min — ABNORMAL LOW (ref >60)
GFR calc non Af Amer: 30 mL/min — ABNORMAL LOW (ref >60)
Glucose, Bld: 91 mg/dL (ref 70–99)
Potassium: 3.7 mmol/L (ref 3.5–5.1)
Sodium: 140 mmol/L (ref 135–145)

## 2018-11-03 MED ORDER — GUAIFENESIN-DM 100-10 MG/5ML PO SYRP
5.0000 mL | ORAL_SOLUTION | ORAL | Status: DC | PRN
  Administered 2018-11-03 (×2): 5 mL via ORAL
  Filled 2018-11-03 (×2): qty 5

## 2018-11-03 MED ORDER — ENOXAPARIN SODIUM 30 MG/0.3ML ~~LOC~~ SOLN
30.0000 mg | Freq: Every day | SUBCUTANEOUS | Status: DC
  Administered 2018-11-03 – 2018-11-05 (×3): 30 mg via SUBCUTANEOUS
  Filled 2018-11-03 (×3): qty 0.3

## 2018-11-03 MED ORDER — ORAL CARE MOUTH RINSE
15.0000 mL | Freq: Two times a day (BID) | OROMUCOSAL | Status: DC
  Administered 2018-11-04 – 2018-11-05 (×3): 15 mL via OROMUCOSAL

## 2018-11-03 MED ORDER — FUROSEMIDE 40 MG PO TABS
40.0000 mg | ORAL_TABLET | Freq: Every day | ORAL | Status: DC
  Administered 2018-11-04 – 2018-11-05 (×2): 40 mg via ORAL
  Filled 2018-11-03 (×2): qty 1

## 2018-11-03 MED ORDER — IPRATROPIUM-ALBUTEROL 0.5-2.5 (3) MG/3ML IN SOLN
3.0000 mL | Freq: Four times a day (QID) | RESPIRATORY_TRACT | Status: DC
  Administered 2018-11-03 (×3): 3 mL via RESPIRATORY_TRACT
  Filled 2018-11-03 (×3): qty 3

## 2018-11-03 NOTE — Progress Notes (Signed)
Progress Note    Shakima Nisley HQR:[id number] DOB: 1956-02-18 DOA: 10/31/2018  PCP: [name], MD  Patient coming from: Home  Chief Complaint: SOB  HPI: Kara Hanson is a 63 y.o. female with medical history significant of CAD, spinal stenosis, HTN, HLD, IDDM type 2, CVA, HFpEF (EF of 50-55% last December) who presents for worsening SOB.  She notes that the symptoms started 2 days ago and then became unbearable today.  She had increased respiratory rate and had to increase her oxygen use.  She has had this happen before, so over the last couple of days, she increased her lasix from 71m daily to 839mdaily, but her UOP stayed low.  She has had no interest in eating and for the last day has had a cough  She reports staying to a low salt diet. [facility]  She had a headache on admission, but this has improved.  She further reports swelling of her LE which has gotten worse.  She denies fever, sputum production, chest pain, change in diet, dysuria, abdominal pain, nausea, vomiting, diarrhea.  She has been taking all of her medications. She was admitted last month for an acute heart failure exacerbation.  She was noted to be COVID-19 positive at that time, but felt to be Asymptomatic.  She had further been admitted in April of this year with COVID-19 pneumonia [facility] and was [facility] on [facility].  She did well with diuresis during both hospital stays.  Today, her COVID-19 test was negative.  Her CXR showed a left lower consolidation and pleural effusion with pulmonary vascular congestion as well.  She tells me that she has a long history of frequent pneumonias since she was a child and that her last one was 1 month ago. In the ED, she was treated with lasix and she reports good urine output.  Labs revealed a normal WBC, with a mildly low H/H which are stable.  Renal function of 38/1.34 (baseline Cr 1.1-1.3), glucose 208.  VBG showed a normal pH with a high O2.   She is now on 4LNC.  Subjective:  The patient was seen and examined this morning, still having cough with some shortness of breath but denies any chest pain. No issues overnight, remains to be on 4 L of oxygen, to maintain O2 sat greater than 90%   Assessment/Plan Active Problems:   Hypertension   Type 2 diabetes mellitus with diabetic neuropathy, unspecified (HCC)   HLD (hyperlipidemia)   Acute on chronic diastolic CHF (congestive heart failure) (HCC)   Acute and chronic respiratory failure with hypoxia (HCC)   CKD (chronic kidney disease), stage III ([place]  CAP (community acquired pneumonia)  Acute hypoxic respiratory failure in the setting of CAP (community acquired pneumonia), POA -Remains in respiratory stress, on 4 L of oxygen, satting 93%  -Previously treated for COVID in April, remained positive in June [facility] 2[facility] this is not a chronic oxygen need. -Excessive dyspnea on exertion noted -Patient remains afebrile, without leukocytosis, likely post viral bacterial infection -Imaging shows bilateral opacifications concerning for bibasilar bacterial community-acquired pneumonia given above, continue azithromycin and ceftriaxone for at least 5-day minimum coverage, pending clinical improvement could consider full 7-day course if not improving  -adding DuoNeb, scheduled Combivent  Questionably concurrent acute on chronic diastolic CHF (congestive heart failure)  Diastolic dysfunction EF 50 to 55% December 2019 -Repeat 2D echocardiogram on  this admission was reviewed as below, ejection fraction remains to be greater than 50%, mild aortic stenosis and regurgitation no significant changes from prior echo, no significant anatomic abnormalities relating to systolic or diastolic congestive heart failure - Continue IV lasix 10m BID -diuresing well, elevated creatinine, Lasix will be held for today, reinitiating Lasix 40 mg p.o. in a.m. - Strict  I/O-Daily weight--600, 73.8 kg  Hypertension, essential,  -Stable - Continue home medications, treat as necessary    Type 2 diabetes mellitus with diabetic neuropathy -Stable - Continue long-acting and sliding scale insulin, hypoglycemic protocol    HLD (hyperlipidemia) - Continue atorvastatin    CKD (chronic kidney disease), stage III  -Creatinine 1.62 >> 1.75 today  -Monitoring BUN/creatinine closely -DC aggressive diuretics IV Lasix today  Normocytic anemia - At baseline, likely related to renal disease - Ferritin is being checked as an inflammatory marker  History of spinal stenosis - Continue gabapentin, duloxetine  DVT prophylaxis: Lovenox  Code Status: Full  Disposition Plan: Admit for further diuresis, antibiotics Consults called: None  Admission status: Med Tele, INP    2D echocardiogram: IMPRESSIONS   1. The left ventricle has a visually estimated ejection fraction of 50%. The cavity size was normal. There is mildly increased left ventricular wall thickness. Left ventricular diastolic Doppler parameters are indeterminate.  2. The right ventricle has normal systolic function. The cavity was normal. There is no increase in right ventricular wall thickness.  3. Left atrial size was mildly dilated.  4. The mitral valve is abnormal. Mild thickening of the mitral valve leaflet.  5. The aortic valve is abnormal. Aortic valve regurgitation is trivial by color flow Doppler. Mild stenosis of the aortic valve. Mean systolic gradient 10 mmHg. Maximum velocity obtained in the apical view.  6. The inferior vena cava was normal in size with <50% respiratory variability.  7. When compared to the prior study: 03/21/18 - EF may be slightly reduced compared to the prior study. No significant change has occured. Side by side comparison of images performed.  8. The interatrial septum was not well visualized.     Prior to Admission medications   Medication Sig Start Date End  Date Taking? Authorizing Provider  Accu-Chek FastClix Lancets MISC USE AS DIRECTED TO TEST BLOOD SUGAR THREE TIMES DAILY 07/31/18   NCharlott Rakes MD  allopurinol (ZYLOPRIM) 300 MG tablet Take 1 tablet (300 mg total) by mouth daily. 09/17/18   NCharlott Rakes MD  amLODipine (NORVASC) 5 MG tablet TAKE 1 TABLET (5 MG TOTAL) BY MOUTH AT BEDTIME. 09/01/18 10/22/18  NCharlott Rakes MD  aspirin EC 81 MG tablet Take 81 mg by mouth daily.    [provider]  atorvastatin (LIPITOR) 80 MG tablet Take 1 tablet (80 mg total) by mouth daily at 6 PM for 30 days. 09/17/18 10/31/18  NCharlott Rakes MD  Blood Glucose Monitoring Suppl (ACCU-CHEK GUIDE) w/Device KIT 1 each by Does not apply route 3 (three) times daily. 07/31/18   NCharlott Rakes MD  carvedilol (COREG) 12.5 MG tablet Take 1 tablet (12.5 mg total) by mouth 2 (two) times daily with a meal. 09/17/18   NCharlott Rakes MD  DULoxetine (CYMBALTA) 60 MG capsule Take 1 capsule (60 mg total) by mouth daily. For chronic back and neck pain 08/07/18   NCharlott Rakes MD  EASY COMFORT PEN NEEDLES 31G X 5 MM MISC USE FOUR TIMES PER DAY FOR INSULIN ADMINISTRATION Patient taking differently: 4 (four) times daily.  10/02/18   NCharlott Rakes MD  ergocalciferol (DRISDOL) 1.25 MG (50000 UT) capsule Take 1 capsule (50,000 Units total) by mouth once a week. 09/18/18   [name], MD  furosemide (LASIX) 40 MG tablet Take 1 tablet (40 mg total) by mouth daily. 10/03/18   Bonnielee Haff, MD  gabapentin (NEURONTIN) 300 MG capsule TAKE 1 CAPSULE (300 MG TOTAL) BY MOUTH 2 (TWO) TIMES DAILY. 09/03/18 10/31/18  [name], MD  glucose blood (ACCU-CHEK GUIDE) test strip USE AS DIRECTED TO TEST BLOOD SUGAR THREE TIMES DAILY 07/31/18   [name], MD  insulin aspart (NOVOLOG) 100 UNIT/ML injection 0 to 12 units subcutaneously 3 times daily before meals as per sliding scale 03/29/18   [name], MD  Insulin Glargine (BASAGLAR KWIKPEN) 100 UNIT/ML SOPN Inject 0.05  mLs (5 Units total) into the skin at bedtime. 10/02/18   Bonnielee Haff, MD  Lancet Device MISC Use as instructed 3 times daily 09/19/17   Renita Papa, PA-C  Misc. Devices MISC Portable oxygen concentrator.  Diagnosis-chronic respiratory failure. 09/03/18   [name], MD  Misc. Devices MISC Rollaor with seat. Dx: Congestive Heart Failure 10/19/18   [name], MD  omeprazole (PRILOSEC) 20 MG capsule Take 20 mg by mouth daily.    [provider]  ondansetron (ZOFRAN) 4 MG tablet Take 1 tablet (4 mg total) by mouth every 6 (six) hours as needed for nausea. 07/25/18   Charlynne Cousins, MD  polyethylene glycol (MIRALAX / GLYCOLAX) 17 g packet Take 17 g by mouth daily. 10/02/18   Bonnielee Haff, MD  SUPER B COMPLEX/C CAPS Take 1 capsule by mouth daily.    [provider]  tiZANidine (ZANAFLEX) 4 MG tablet TAKE 1 TABLET (4 MG TOTAL) BY MOUTH EVERY 8 (EIGHT) HOURS AS NEEDED FOR MUSCLE SPASMS. 09/01/18   [name], MD    Physical Exam: BP (!) 117/55 (BP Location: Right Arm)    Pulse 78    Temp 98 F (36.7 C) (Oral)    Resp 15    Ht _0  (1.499 m)    Wt 73.8 kg    SpO2 93%    BMI 32.86 kg/m    Physical Exam  Constitution:  Alert, cooperative, mild respiratory stress, with persistent cough,, remains to be on 4 L of oxygen O2 sat 93% Psychiatric: Normal and stable mood and affect, cognition intact,   HEENT: Normocephalic, PERRL, otherwise with in Normal limits  Chest:Chest symmetric, diffuse rhonchi, minimal crackles at lower lobes, positive air sounds diffusely Cardio vascular:  S1/S2, RRR, No murmure, No Rubs or Gallops  pulmonary: Clear to auscultation bilaterally, respirations unlabored, negative wheezes / crackles Abdomen: Soft, non-tender, non-distended, bowel sounds,no masses, no organomegaly Muscular skeletal: Limited exam - in bed, able to move all 4 extremities, Normal strength,  Neuro: CNII-XII intact. , normal motor and sensation, reflexes intact    Extremities: No pitting edema lower extremities, +2 pulses  Skin: Dry, warm to touch, negative for any Rashes, No open wounds Wounds: per nursing documentation  Labs on Admission: I have personally reviewed following labs and imaging studies  CBC: Recent Labs  Lab 10/31/18 1530 10/31/18 1825 10/31/18 2030 11/01/18 0151 11/02/18 0535 11/03/18 0531  WBC 7.6  --  7.5 8.1 8.3 6.6  NEUTROABS  --   --  6.5  --   --   --   HGB 10.9* 13.6 10.9* 11.8* 10.2* 9.5*  HCT 35.4* 40.0 35.5* 37.8 33.1* 31.4*  MCV 87.6  --  89.0 87.1 88.7 90.2  PLT 260  --  259 294 280 852   Basic Metabolic Panel: Recent Labs  Lab 10/31/18 1530 10/31/18 1825 11/01/18 0151 11/02/18 0535 11/03/18 0531  NA 139 138 140 141 140  K 3.8 4.0 3.9 3.8 3.7  CL 106  --  104 104 105  CO2 23  --  _0 GLUCOSE 208*  --  319* 142* 91  BUN 38*  --  41* 56* 56*  CREATININE 1.34*  --  1.51* 1.62* 1.75*  CALCIUM 9.4  --  9.5 9.3 9.0   GFR: Estimated Creatinine Clearance: 28.8 mL/min (A) (by C-G formula based on SCr of 1.75 mg/dL (H)). Liver Function Tests: Recent Labs  Lab 10/31/18 1530  AST 14*  ALT 16  ALKPHOS 69  BILITOT 0.5  PROT 6.2*  ALBUMIN 3.0*   No results for input(s): LIPASE, AMYLASE in the last 168 hours. No results for input(s): AMMONIA in the last 168 hours. Coagulation Profile: Recent Labs  Lab 10/31/18 2056  INR 1.1  CBG: Recent Labs  Lab 11/02/18 1116 11/02/18 1612 11/02/18 2233 11/03/18 0618 11/03/18 1147  GLUCAP 161* 102* 114* 94 174*  Anemia Panel: Recent Labs    10/31/18 2056  FERRITIN 77   Urine analysis:    Component Value Date/Time   COLORURINE YELLOW 01/02/2018 1452   APPEARANCEUR HAZY (A) 01/02/2018 1452   LABSPEC 1.039 (H) 01/02/2018 1452   PHURINE 6.0 01/02/2018 1452   GLUCOSEU >=500 (A) 01/02/2018 1452   HGBUR NEGATIVE 01/02/2018 1452   BILIRUBINUR NEGATIVE 01/02/2018 1452   BILIRUBINUR negative 12/13/2017 [place] 01/02/2018 1452    PROTEINUR >=300 (A) 01/02/2018 1452   UROBILINOGEN 0.2 12/13/2017 1514   NITRITE NEGATIVE 01/02/2018 1452   LEUKOCYTESUR NEGATIVE 01/02/2018 1452    Radiological Exams on Admission: No results found. Deatra James MD [facility]  If 7PM-7AM, please contact night-coverage [email or web address] Password [email or web address]  11/03/2018, 11:59 AM

## 2018-11-03 NOTE — Progress Notes (Signed)
Cardiac monitoring orders expired. Telemetry removed with MD approval.

## 2018-11-04 LAB — CBC
HCT: 30.2 % — ABNORMAL LOW (ref 36.0–46.0)
Hemoglobin: 9.4 g/dL — ABNORMAL LOW (ref 12.0–15.0)
MCH: 27.4 pg (ref 26.0–34.0)
MCHC: 31.1 g/dL (ref 30.0–36.0)
MCV: 88 fL (ref 80.0–100.0)
Platelets: 249 10*3/uL (ref 150–400)
RBC: 3.43 MIL/uL — ABNORMAL LOW (ref 3.87–5.11)
RDW: 15.9 % — ABNORMAL HIGH (ref 11.5–15.5)
WBC: 5.3 10*3/uL (ref 4.0–10.5)
nRBC: 0 % (ref 0.0–0.2)

## 2018-11-04 LAB — GLUCOSE, CAPILLARY
Glucose-Capillary: 98 mg/dL (ref 70–99)
Glucose-Capillary: 188 mg/dL — ABNORMAL HIGH (ref 70–99)
Glucose-Capillary: 131 mg/dL — ABNORMAL HIGH (ref 70–99)

## 2018-11-04 LAB — BASIC METABOLIC PANEL
Anion gap: 7 (ref 5–15)
BUN: 53 mg/dL — ABNORMAL HIGH (ref 8–23)
CO2: 28 mmol/L (ref 22–32)
Calcium: 9 mg/dL (ref 8.9–10.3)
Chloride: 104 mmol/L (ref 98–111)
Creatinine, Ser: 1.53 mg/dL — ABNORMAL HIGH (ref 0.44–1.00)
GFR calc Af Amer: 42 mL/min — ABNORMAL LOW (ref >60)
GFR calc non Af Amer: 36 mL/min — ABNORMAL LOW (ref >60)
Glucose, Bld: 92 mg/dL (ref 70–99)
Potassium: 3.6 mmol/L (ref 3.5–5.1)
Sodium: 139 mmol/L (ref 135–145)

## 2018-11-04 MED ORDER — IPRATROPIUM-ALBUTEROL 0.5-2.5 (3) MG/3ML IN SOLN: 3.0000 mL | Freq: Four times a day (QID) | RESPIRATORY_TRACT | Status: DC | PRN

## 2018-11-04 MED ORDER — GUAIFENESIN-DM 100-10 MG/5ML PO SYRP
10.0000 mL | ORAL_SOLUTION | Freq: Four times a day (QID) | ORAL | Status: DC
  Administered 2018-11-04 – 2018-11-05 (×5): 10 mL via ORAL
  Filled 2018-11-04 (×5): qty 10

## 2018-11-04 MED ORDER — IPRATROPIUM-ALBUTEROL 0.5-2.5 (3) MG/3ML IN SOLN
3.0000 mL | Freq: Two times a day (BID) | RESPIRATORY_TRACT | Status: DC
  Administered 2018-11-04: 08:00:00 3 mL via RESPIRATORY_TRACT
  Filled 2018-11-04: qty 3

## 2018-11-04 NOTE — Progress Notes (Signed)
Progress Note    Kara Hanson MHD:[id number] DOB: [birth year] DOA: 10/31/2018  PCP: [name], MD  Patient coming from: Home  Chief Complaint: SOB  HPI: Kara Hanson is a 63 y.o. female with medical history significant of CAD, spinal stenosis, HTN, HLD, IDDM type 2, CVA, HFpEF (EF of 50-55% last December) who presents for worsening SOB.  She notes that the symptoms started 2 days ago and then became unbearable today.  She had increased respiratory rate and had to increase her oxygen use.  She has had this happen before, so over the last couple of days, she increased her lasix from 22m daily to 826mdaily, but her UOP stayed low.  She has had no interest in eating and for the last day has had a cough  She reports staying to a low salt diet. [facility]  She had a headache on admission, but this has improved.  She further reports swelling of her LE which has gotten worse.  She denies fever, sputum production, chest pain, change in diet, dysuria, abdominal pain, nausea, vomiting, diarrhea.  She has been taking all of her medications. She was admitted last month for an acute heart failure exacerbation.  She was noted to be COVID-19 positive at that time, but felt to be Asymptomatic.  She had further been admitted in April of this year with COVID-19 pneumonia [facility] and [facility].  She did well with diuresis during both hospital stays.  Today, her COVID-19 test was negative.  Her CXR showed a left lower consolidation and pleural effusion with pulmonary vascular congestion as well.  She tells me that she has a long history of frequent pneumonias since she was a child and that her last one was 1 month ago. In the ED, she was treated with lasix and she reports good urine output.  Labs revealed a normal WBC, with a mildly low H/H which are stable.  Renal function of 38/1.34 (baseline Cr 1.1-1.3), glucose 208.  VBG showed a normal pH with a high O2.   She is now on 4LNC.  Subjective:   The patient was seen and examined this morning, stable, still having dry cough, shortness of breath, O2 sat improved on 2 L of oxygen to 96%.  Desats quickly with exertion.   Assessment/Plan Active Problems:   Hypertension   Type 2 diabetes mellitus with diabetic neuropathy, unspecified (HCC)   HLD (hyperlipidemia)   Acute on chronic diastolic CHF (congestive heart failure) (HCC)   Acute and chronic respiratory failure with hypoxia (HCC)   [place]D (chronic kidney disease), stage III [place]  CAP (community acquired pneumonia)  Acute hypoxic respiratory failure in the setting of CAP (community acquired pneumonia), POA -Remains in respite distress but O2 sat is improved on 2 L of oxygen from 93 to 96% this morning. -Still having persistent dry cough.  -Previously treated for COVID in April, remained positive in June [place] [facility] not a chronic [facility] need. -Excessive dyspnea on exertion noted -Patient remains afebrile, without leukocytosis, likely post viral bacterial infection -Imaging shows bilateral opacifications concerning for bibasilar bacterial community-acquired pneumonia given above, continue azithromycin and ceftriaxone for at least 5-day minimum coverage, pending clinical improvement could consider full 7-day course if not improving  -adding DuoNeb, scheduled Combivent  Questionably concurrent acute on chronic diastolic CHF (congestive heart failure)  Diastolic dysfunction EF 50 to  55% December 2019 -Stable continue to monitor change Lasix to p.o. -Repeat 2D echocardiogram on this admission was reviewed as below, ejection fraction remains to be greater than 50%, mild aortic stenosis and regurgitation no significant changes from prior echo, no significant anatomic abnormalities relating to systolic or diastolic congestive heart failure - Continue IV lasix 83m BID -diuresing well, elevated creatinine,  Lasix will be held for today, reinitiating Lasix 40 mg p.o.  - Strict I/O-Daily weight--600, 73.8 kg  Hypertension, essential,  -Stable monitoring - Continue home medications, treat as necessary    Type 2 diabetes mellitus with diabetic neuropathy -Stable monitoring - Continue long-acting and sliding scale insulin, hypoglycemic protocol    HLD (hyperlipidemia) - Continue atorvastatin    CKD (chronic kidney disease), stage III  -Creatinine 1.62 >> 1.75 >>  1.53  today  -Monitoring BUN/creatinine closely -DC aggressive diuretics IV Lasix today  Normocytic anemia - At baseline, likely related to renal disease - Ferritin is being checked as an inflammatory marker  History of spinal stenosis - Continue gabapentin, duloxetine  DVT prophylaxis: Lovenox  Code Status: Full  Disposition Plan: Admit for further diuresis, antibiotics Consults called: None  Admission status: Med Tele, INP    2D echocardiogram: IMPRESSIONS   1. The left ventricle has a visually estimated ejection fraction of 50%. The cavity size was normal. There is mildly increased left ventricular wall thickness. Left ventricular diastolic Doppler parameters are indeterminate.  2. The right ventricle has normal systolic function. The cavity was normal. There is no increase in right ventricular wall thickness.  3. Left atrial size was mildly dilated.  4. The mitral valve is abnormal. Mild thickening of the mitral valve leaflet.  5. The aortic valve is abnormal. Aortic valve regurgitation is trivial by color flow Doppler. Mild stenosis of the aortic valve. Mean systolic gradient 10 mmHg. Maximum velocity obtained in the apical view.  6. The inferior vena cava was normal in size with <50% respiratory variability.  7. When compared to the prior study: 03/21/18 - EF may be slightly reduced compared to the prior study. No significant change has occured. Side by side comparison of images performed.  8. The interatrial septum  was not well visualized.     Prior to Admission medications   Medication Sig Start Date End Date Taking? Authorizing Provider  Accu-Chek FastClix Lancets MISC USE AS DIRECTED TO TEST BLOOD SUGAR THREE TIMES DAILY 07/31/18   NCharlott Rakes MD  allopurinol (ZYLOPRIM) 300 MG tablet Take 1 tablet (300 mg total) by mouth daily. 09/17/18   NCharlott Rakes MD  amLODipine (NORVASC) 5 MG tablet TAKE 1 TABLET (5 MG TOTAL) BY MOUTH AT BEDTIME. 09/01/18 10/22/18  NCharlott Rakes MD  aspirin EC 81 MG tablet Take 81 mg by mouth daily.    [provider]  atorvastatin (LIPITOR) 80 MG tablet Take 1 tablet (80 mg total) by mouth daily at 6 PM for 30 days. 09/17/18 10/31/18  NCharlott Rakes MD  Blood Glucose Monitoring Suppl (ACCU-CHEK GUIDE) w/Device KIT 1 each by Does not apply route 3 (three) times daily. 07/31/18   NCharlott Rakes MD  carvedilol (COREG) 12.5 MG tablet Take 1 tablet (12.5 mg total) by mouth 2 (two) times daily with a meal. 09/17/18   NCharlott Rakes MD  DULoxetine (CYMBALTA) 60 MG capsule Take 1 capsule (60 mg total) by mouth daily. For chronic back and neck pain 08/07/18   NCharlott Rakes MD  EASY COMFORT PEN NEEDLES 31G X 5 MM MISC USE FOUR  TIMES PER DAY FOR INSULIN ADMINISTRATION Patient taking differently: 4 (four) times daily.  10/02/18   [name], MD  ergocalciferol (DRISDOL) 1.25 MG (50000 UT) capsule Take 1 capsule (50,000 Units total) by mouth once a week. 09/18/18   [name], MD  furosemide (LASIX) 40 MG tablet Take 1 tablet (40 mg total) by mouth daily. 10/03/18   Bonnielee Haff, MD  gabapentin (NEURONTIN) 300 MG capsule TAKE 1 CAPSULE (300 MG TOTAL) BY MOUTH 2 (TWO) TIMES DAILY. 09/03/18 10/31/18  [name], MD  glucose blood (ACCU-CHEK GUIDE) test strip USE AS DIRECTED TO TEST BLOOD SUGAR THREE TIMES DAILY 07/31/18   [name], MD  insulin aspart (NOVOLOG) 100 UNIT/ML injection 0 to 12 units subcutaneously 3 times daily before meals as per sliding scale  03/29/18   [name], MD  Insulin Glargine (BASAGLAR KWIKPEN) 100 UNIT/ML SOPN Inject 0.05 mLs (5 Units total) into the skin at bedtime. 10/02/18   Bonnielee Haff, MD  Lancet Device MISC Use as instructed 3 times daily 09/19/17   Renita Papa, PA-C  Misc. Devices MISC Portable oxygen concentrator.  Diagnosis-chronic respiratory failure. 09/03/18   [name], MD  Misc. Devices MISC Rollaor with seat. Dx: Congestive Heart Failure 10/19/18   [name], MD  omeprazole (PRILOSEC) 20 MG capsule Take 20 mg by mouth daily.    [provider]  ondansetron (ZOFRAN) 4 MG tablet Take 1 tablet (4 mg total) by mouth every 6 (six) hours as needed for nausea. 07/25/18   Charlynne Cousins, MD  polyethylene glycol (MIRALAX / GLYCOLAX) 17 g packet Take 17 g by mouth daily. 10/02/18   Bonnielee Haff, MD  SUPER B COMPLEX/C CAPS Take 1 capsule by mouth daily.    [provider]  tiZANidine (ZANAFLEX) 4 MG tablet TAKE 1 TABLET (4 MG TOTAL) BY MOUTH EVERY 8 (EIGHT) HOURS AS NEEDED FOR MUSCLE SPASMS. 09/01/18   [name], MD   BP (!) 149/65 (BP Location: Right Arm)    Pulse 80    Temp 98.5 F (36.9 C) (Oral)    Resp 18    Ht 4' 11"  (1.499 m)    Wt 73 kg Comment: scale b   SpO2 96%    BMI 32.52 kg/m    Physical Exam  Constitution:  Alert, cooperative, no distress, still coughing with shortness of breath O2 via nasal cannula Psychiatric: Normal and stable mood and affect, cognition intact,   HEENT: Normocephalic, PERRL, otherwise with in Normal limits  Chest:Chest symmetric Cardio vascular:  S1/S2, RRR, No murmure, No Rubs or Gallops  pulmonary: Diffuse rhonchi, minimal wheezing, positive air sounds diffusely, if any crackles, negative any rubs Abdomen: Soft, non-tender, non-distended, bowel sounds,no masses, no organomegaly Muscular skeletal: Limited exam - in bed, able to move all 4 extremities, Normal strength,  Neuro: CNII-XII intact. , normal motor and sensation, reflexes  intact  Extremities: No pitting edema lower extremities, +2 pulses  Skin: Dry, warm to touch, negative for any Rashes, No open wounds Wounds: per nursing documentation     Labs on Admission: I have personally reviewed following labs and imaging studies  CBC: Recent Labs  Lab 10/31/18 2030 11/01/18 0151 11/02/18 0535 11/03/18 0531 11/04/18 0448  WBC 7.5 8.1 8.3 6.6 5.3  NEUTROABS 6.5  --   --   --   --   HGB 10.9* 11.8* 10.2* 9.5* 9.4*  HCT 35.5* 37.8 33.1* 31.4* 30.2*  MCV 89.0 87.1 88.7 90.2 88.0  PLT 259 294 280 263  449   Basic Metabolic Panel: Recent Labs  Lab 10/31/18 1530 10/31/18 1825 11/01/18 0151 11/02/18 0535 11/03/18 0531 11/04/18 0448  NA 139 138 140 141 140 139  K 3.8 4.0 3.9 3.8 3.7 3.6  CL 106  --  104 104 105 104  CO2 23  --  23 27 28 28   GLUCOSE 208*  --  319* 142* 91 92  BUN 38*  --  41* 56* 56* 53*  CREATININE 1.34*  --  1.51* 1.62* 1.75* 1.53*  CALCIUM 9.4  --  9.5 9.3 9.0 9.0   GFR: Estimated Creatinine Clearance: 32.7 mL/min (A) (by C-G formula based on SCr of 1.53 mg/dL (H)). Liver Function Tests: Recent Labs  Lab 10/31/18 1530  AST 14*  ALT 16  ALKPHOS 69  BILITOT 0.5  PROT 6.2*  ALBUMIN 3.0*   No results for input(s): LIPASE, AMYLASE in the last 168 hours. No results for input(s): AMMONIA in the last 168 hours. Coagulation Profile: Recent Labs  Lab 10/31/18 2056  INR 1.1  CBG: Recent Labs  Lab 11/03/18 1147 11/03/18 1618 11/03/18 2121 11/04/18 0626 11/04/18 1235  GLUCAP 174* 157* 127* 98 188*  Anemia Panel: No results for input(s): VITAMINB12, FOLATE, FERRITIN, TIBC, IRON, RETICCTPCT in the last 72 hours. Urine analysis:    Component Value Date/Time   COLORURINE YELLOW 01/02/2018 1452   APPEARANCEUR HAZY (A) 01/02/2018 1452   LABSPEC 1.039 (H) 01/02/2018 1452   PHURINE 6.0 01/02/2018 1452   GLUCOSEU >=500 (A) 01/02/2018 1452   HGBUR NEGATIVE 01/02/2018 1452   BILIRUBINUR NEGATIVE 01/02/2018 1452   BILIRUBINUR  negative 12/13/2017 Stem 01/02/2018 1452   PROTEINUR >=300 (A) 01/02/2018 1452   UROBILINOGEN 0.2 12/13/2017 1514   NITRITE NEGATIVE 01/02/2018 1452   LEUKOCYTESUR NEGATIVE 01/02/2018 1452    Radiological Exams on Admission: No results found. Deatra James MD [facility]  If 7PM-7AM, please contact night-coverage [email or web address] Password [email or web address]  11/04/2018, 12:54 PM

## 2018-11-05 LAB — GLUCOSE, CAPILLARY
Glucose-Capillary: 197 mg/dL — ABNORMAL HIGH (ref 70–99)
Glucose-Capillary: 92 mg/dL (ref 70–99)
Glucose-Capillary: 170 mg/dL — ABNORMAL HIGH (ref 70–99)

## 2018-11-05 LAB — BASIC METABOLIC PANEL
Anion gap: 10 (ref 5–15)
BUN: 45 mg/dL — ABNORMAL HIGH (ref 8–23)
CO2: 29 mmol/L (ref 22–32)
Calcium: 9 mg/dL (ref 8.9–10.3)
Chloride: 100 mmol/L (ref 98–111)
Creatinine, Ser: 1.44 mg/dL — ABNORMAL HIGH (ref 0.44–1.00)
GFR calc Af Amer: 45 mL/min — ABNORMAL LOW (ref >60)
GFR calc non Af Amer: 39 mL/min — ABNORMAL LOW (ref >60)
Glucose, Bld: 101 mg/dL — ABNORMAL HIGH (ref 70–99)
Potassium: 3.6 mmol/L (ref 3.5–5.1)
Sodium: 139 mmol/L (ref 135–145)

## 2018-11-05 LAB — CBC
HCT: 32.4 % — ABNORMAL LOW (ref 36.0–46.0)
Hemoglobin: 10 g/dL — ABNORMAL LOW (ref 12.0–15.0)
MCH: 27.5 pg (ref 26.0–34.0)
MCHC: 30.9 g/dL (ref 30.0–36.0)
MCV: 89.3 fL (ref 80.0–100.0)
Platelets: 247 10*3/uL (ref 150–400)
RBC: 3.63 MIL/uL — ABNORMAL LOW (ref 3.87–5.11)
RDW: 15.8 % — ABNORMAL HIGH (ref 11.5–15.5)
WBC: 5.3 10*3/uL (ref 4.0–10.5)
nRBC: 0 % (ref 0.0–0.2)

## 2018-11-05 MED ORDER — METHYLPREDNISOLONE 4 MG PO TABS: 4.0000 mg | ORAL_TABLET | Freq: Every day | ORAL | 0 refills | Status: DC

## 2018-11-05 MED ORDER — LEVOFLOXACIN 500 MG PO TABS: 500.0000 mg | ORAL_TABLET | Freq: Every day | ORAL | 0 refills | Status: AC

## 2018-11-05 MED ORDER — GUAIFENESIN-DM 100-10 MG/5ML PO SYRP: 10.0000 mL | ORAL_SOLUTION | Freq: Four times a day (QID) | ORAL | 0 refills | Status: DC

## 2018-11-05 MED ORDER — LACTINEX PO CHEW: 1.0000 | CHEWABLE_TABLET | Freq: Three times a day (TID) | ORAL | 0 refills | Status: DC

## 2018-11-05 NOTE — Discharge Summary (Signed)
Physician Discharge Summary [facility]    Patient: Kara Hanson                   Admit date: 10/31/2018   DOB: 1955-09-24             Discharge date:11/05/2018/9:55 AM KCL:[id number]                          PCP: Kara Rakes, MD  Disposition: Home   Recommendations for Outpatient Follow-up:    Follow up: in 1 week  Discharge Condition: Stable   Code Status:   Code Status: Full Code  Diet recommendation: Cardiac diet   Discharge Diagnoses:    Active Problems:   Hypertension   Type 2 diabetes mellitus with diabetic neuropathy, unspecified (HCC)   HLD (hyperlipidemia)   Acute on chronic diastolic CHF (congestive heart failure) (HCC)   Acute and chronic respiratory failure with hypoxia (HCC)   CKD (chronic kidney disease), stage III ([place])   CAP (community acquired pneumonia)   History of Present Illness/ Hospital Course Kara Hanson Summary:   HPI: Kara Hanson is a 63 y.o. female with medical history significant of CAD, spinal stenosis, HTN, HLD, IDDM type 2, CVA, HFpEF (EF of 50-55% last December) who presents for worsening SOB.  She notes that the symptoms started 2 days ago and then became unbearable today.  She had increased respiratory rate and had to increase her oxygen use.  She has had this happen before, so over the last couple of days, she increased her lasix from 50m daily to 861mdaily, but her UOP stayed low.  She has had no interest in eating and for the last day has had a cough  She reports staying to a low salt diet. [facility]  She had a headache on admission, but this has improved.  She further reports swelling of her LE which has gotten worse.  She denies fever, sputum production, chest pain, change in diet, dysuria, abdominal pain, nausea, vomiting, diarrhea.  She has been taking all of her medications. She was admitted last month for an acute heart failure exacerbation.  She was noted to be COVID-19 positive at that time, but  felt to be Asymptomatic.  She had further been admitted in April of this year with COVID[facility]19 pneumonia [facility].  She did well with diuresis during both hospital stays.  Today, her COVID-19 test was negative.  Her CXR showed a left lower consolidation and pleural effusion with pulmonary vascular congestion as well.  She tells me that she has a long history of frequent pneumonias since she was a child and that her last one was 1 month ago. In the ED, she was treated with lasix and she reports good urine output.  Labs revealed a normal WBC, with a mildly low H/H which are stable.  Renal function of 38/1.34 (baseline Cr 1.1-1.3), glucose 208.  VBG showed a normal pH with a high O2.  She is now on 4LNC.  Subjective:   The patient was seen and examined this morning, stable, still having dry cough, shortness of breath, O2 sat improved on 2 L of oxygen to 96%.  Desats quickly with exertion.   Discharge summary/assessment/Plan  Acute hypoxic respiratory failure in the setting of CAP (community acquired pneumonia), POA -Much improved -Shortness of breath has much improved, patient has been weaned  down to 2 L, trial of  ambulation on room air today.  -Previously treated for COVID in April, remained positive in June discharged on 2-3 L nasal cannula from [place] but this is not a chronic oxygen need. -Excessive dyspnea on exertion noted -Patient remains afebrile, without leukocytosis, likely post viral bacterial infection -Imaging shows bilateral opacifications concerning for bibasilar bacterial community-acquired pneumonia given above, continue azithromycin and ceftriaxone for at least 5-day minimum coverage,   -Patient will be discharged on p.o. antibiotics of Levaquin for 5 more days this will conclude [place] 10 days of antibiotic course -Medrol Dosepak   Questionably concurrent acute on chronic diastolic CHF (congestive heart failure)  Diastolic  dysfunction EF 50 to 55% December 2019 -Stable continue to monitor change Lasix to p.o. -Repeat 2D echocardiogram on this admission was reviewed as below, ejection fraction remains to be greater than 50%, mild aortic stenosis and regurgitation no significant changes from prior echo, no significant anatomic abnormalities relating to systolic or diastolic congestive heart failure - Continue IV lasix 63m BID -diuresing well,  reinitiating Lasix 40 mg p.o.  - Strict I/O-Daily weight--600, 73.8 kg  Hypertension, essential,  -Stable monitoring - Continue home medications, including Norvasc and metoprolol    Type 2 diabetes mellitus with diabetic neuropathy -Stable monitoring -Restarting home regimen insulin l    HLD (hyperlipidemia) - Continue atorvastatin    CKD (chronic kidney disease), stage III  -Creatinine 1.62 >> 1.75 >>  1.53 >> 1.44 today  -Monitoring BUN/creatinine closely -DC aggressive diuretics IV Lasix today  Normocytic anemia - At baseline, likely related to renal disease - Ferritin is being checked as an inflammatory marker  History of spinal stenosis - Continue gabapentin, duloxetine   Code Status: Full  Disposition Plan:  Discharging home Consults called: None  Admission status: Med Tele, INP    2D echocardiogram: IMPRESSIONS  1. The left ventricle has a visually estimated ejection fraction of 50%. The cavity size was normal. There is mildly increased left ventricular wall thickness. Left ventricular diastolic Doppler parameters are indeterminate. 2. The right ventricle has normal systolic function. The cavity was normal. There is no increase in right ventricular wall thickness. 3. Left atrial size was mildly dilated. 4. The mitral valve is abnormal. Mild thickening of the mitral valve leaflet. 5. The aortic valve is abnormal. Aortic valve regurgitation is trivial by color flow Doppler. Mild stenosis of the aortic valve. Mean systolic gradient 10  mmHg. Maximum velocity obtained in the apical view. 6. The inferior vena cava was normal in size with <50% respiratory variability. 7. When compared to the prior study: 03/21/18 - EF may be slightly reduced compared to the prior study. No significant change has occured. Side by side comparison of images performed. 8. The interatrial septum was not well visualized.   Discharge Instructions:   Discharge Instructions    (HEART FAILURE PATIENTS) Call MD:  Anytime you have any of the following symptoms: 1) 3 pound weight gain in 24 hours or 5 pounds in 1 week 2) shortness of breath, with or without a dry hacking cough 3) swelling in the hands, feet or stomach 4) if you have to sleep on extra pillows at night in order to breathe.   Complete by: As directed    Activity as tolerated - No restrictions   Complete by: As directed    Call MD for:  difficulty breathing, headache or visual disturbances   Complete by: As directed    Call MD for:  temperature >  100.4   Complete by: As directed    Diet - low sodium heart healthy   Complete by: As directed    Discharge instructions   Complete by: As directed    Please follow-up with your primary care doctor in 1 week.  Please check your weight daily if you gain greater than 5 pounds, or develop shortness of breath, may take an extra dose of Lasix.   Increase activity slowly   Complete by: As directed    Increase activity slowly   Complete by: As directed        Medication List    STOP taking these medications   tiZANidine 4 MG tablet Commonly known as: ZANAFLEX     TAKE these medications   Accu-Chek FastClix Lancets Misc USE AS DIRECTED TO TEST BLOOD SUGAR THREE TIMES DAILY   Accu-Chek Guide w/Device Kit 1 each by Does not apply route 3 (three) times daily.   allopurinol 300 MG tablet Commonly known as: ZYLOPRIM Take 1 tablet (300 mg total) by mouth daily.   amLODipine 5 MG tablet Commonly known as: NORVASC TAKE 1 TABLET (5 MG  TOTAL) BY MOUTH AT BEDTIME.   aspirin EC 81 MG tablet Take 81 mg by mouth daily.   atorvastatin 80 MG tablet Commonly known as: LIPITOR Take 1 tablet (80 mg total) by mouth daily at 6 PM for 30 days.   Basaglar KwikPen 100 UNIT/ML Sopn Inject 0.05 mLs (5 Units total) into the skin at bedtime.   carvedilol 12.5 MG tablet Commonly known as: COREG Take 1 tablet (12.5 mg total) by mouth 2 (two) times daily with a meal.   DULoxetine 60 MG capsule Commonly known as: Cymbalta Take 1 capsule (60 mg total) by mouth daily. For chronic back and neck pain   Easy Comfort Pen Needles 31G X 5 MM Misc Generic drug: Insulin Pen Needle USE FOUR TIMES PER DAY FOR INSULIN ADMINISTRATION What changed: See the new instructions.   ergocalciferol 1.25 MG (50000 UT) capsule Commonly known as: Drisdol Take 1 capsule (50,000 Units total) by mouth once a week.   furosemide 40 MG tablet Commonly known as: LASIX Take 1 tablet (40 mg total) by mouth daily.   gabapentin 300 MG capsule Commonly known as: NEURONTIN TAKE 1 CAPSULE (300 MG TOTAL) BY MOUTH 2 (TWO) TIMES DAILY.   glucose blood test strip Commonly known as: Accu-Chek Guide USE AS DIRECTED TO TEST BLOOD SUGAR THREE TIMES DAILY   guaiFENesin-dextromethorphan 100-10 MG/5ML syrup Commonly known as: ROBITUSSIN DM Take 10 mLs by mouth every 6 (six) hours.   insulin aspart 100 UNIT/ML injection Commonly known as: novoLOG 0 to 12 units subcutaneously 3 times daily before meals as per sliding scale   lactobacillus acidophilus & bulgar chewable tablet Chew 1 tablet by mouth 3 (three) times daily with meals.   Lancet Device Misc Use as instructed 3 times daily   levofloxacin 500 MG tablet Commonly known as: Levaquin Take 1 tablet (500 mg total) by mouth daily for 10 days.   methylPREDNISolone 4 MG tablet Commonly known as: Medrol Take 1 tablet (4 mg total) by mouth daily. Take medication as directed per dose pack   Misc. Devices  Misc Portable oxygen concentrator.  Diagnosis-chronic respiratory failure.   Misc. Devices Misc Rollaor with seat. Dx: Congestive Heart Failure   omeprazole 20 MG capsule Commonly known as: PRILOSEC Take 20 mg by mouth daily.   ondansetron 4 MG tablet Commonly known as: ZOFRAN Take 1 tablet (4 mg  total) by mouth every 6 (six) hours as needed for nausea.   polyethylene glycol 17 g packet Commonly known as: MIRALAX / GLYCOLAX Take 17 g by mouth daily.   Super B Complex/C Caps Take 1 capsule by mouth daily.       Allergies  Allergen Reactions   Garlic Shortness Of Breath, Itching and Swelling    Hand itching and swelling   Latex Itching   Morphine And Related Itching and Other (See Comments)    Headache    Other Itching    Reaction to newspaper ink -itching and headache     Procedures /Studies:   Dg Chest Port 1 View  Result Date: 10/31/2018 CLINICAL DATA:  Shortness of breath and chest pain EXAM: PORTABLE CHEST 1 VIEW COMPARISON:  September 30, 2018 FINDINGS: There is airspace consolidation in the left lower lobe with small left pleural effusion. There is mild atelectatic change in each lower lobe. Heart is borderline enlarged with mild pulmonary venous hypertension. No adenopathy. No bone lesions. IMPRESSION: 1. Airspace consolidation consistent with pneumonia left base with small left pleural effusion. 2.  Lower lobe atelectatic change bilaterally. 3.  Pulmonary vascular congestion. Electronically Signed   By: Lowella Grip III M.D.   On: 10/31/2018 15:49     Subjective:   Patient was seen and examined 11/05/2018, 9:55 AM Patient stable today. No acute distress.  No issues overnight Stable for discharge.  Discharge Exam:    Vitals:   11/04/18 1830 11/04/18 1925 11/05/18 0513 11/05/18 0842  BP: 137/66 (!) 151/80 (!) 156/74 (!) 163/74  Pulse: 87 82 75 76  Resp:  18 18   Temp:  98.4 F (36.9 C) 98.7 F (37.1 C) 98.4 F (36.9 C)  TempSrc:  Oral Oral Oral   SpO2:  96% 94% 98%  Weight:      Height:        General: Pt lying comfortably in bed & appears in no obvious distress.  Much improved shortness of breath and cough Cardiovascular: S1 & S2 heard, RRR, S1/S2 +. No murmurs, rubs, gallops or clicks. No JVD or pedal edema. Respiratory: Clear to auscultation without wheezing, minimal rhonchi, no  crackles. No increased work of breathing. Abdominal:  Non-distended, non-tender & soft. No organomegaly or masses appreciated. Normal bowel sounds heard. CNS: Alert and oriented. No focal deficits. Extremities: no edema, no cyanosis    The results of significant diagnostics from this hospitalization (including imaging, microbiology, ancillary and laboratory) are listed below for reference.      Microbiology:   Recent Results (from the past 240 hour(s))  SARS Coronavirus 2 (CEPHEID - Performed in [place] hospital lab), Hosp Order     Status: None   Collection Time: 10/31/18  3:36 PM   Specimen: Nasopharyngeal Swab  Result Value Ref Range Status   SARS Coronavirus 2 NEGATIVE NEGATIVE Final    Comment: (NOTE) If result is NEGATIVE SARS-CoV-2 target nucleic acids are NOT DETECTED. The SARS-CoV-2 RNA is generally detectable in upper and lower  respiratory specimens during the acute phase of infection. The lowest  concentration of SARS-CoV-2 viral copies this assay can detect is 250  copies / mL. A negative result does not preclude SARS-CoV-2 infection  and should not be used as the sole basis for treatment or other  patient management decisions.  A negative result may occur with  improper specimen collection / handling, submission of specimen other  than nasopharyngeal swab, presence of viral mutation(s) within the  areas targeted  by this assay, and inadequate number of viral copies  (<250 copies / mL). A negative result must be combined with clinical  observations, patient history, and epidemiological information. If result is  POSITIVE SARS-CoV-2 target nucleic acids are DETECTED. The SARS-CoV-2 RNA is generally detectable in upper and lower  respiratory specimens dur ing the acute phase of infection.  Positive  results are indicative of active infection with SARS-CoV-2.  Clinical  correlation with patient history and other diagnostic information is  necessary to determine patient infection status.  Positive results do  not rule out bacterial infection or co-infection with other viruses. If result is PRESUMPTIVE POSTIVE SARS-CoV-2 nucleic acids MAY BE PRESENT.   A presumptive positive result was obtained on the submitted specimen  and confirmed on repeat testing.  While 2019 novel coronavirus  (SARS-CoV-2) nucleic acids may be present in the submitted sample  additional confirmatory testing may be necessary for epidemiological  and / or clinical management purposes  to differentiate between  SARS-CoV-2 and other Sarbecovirus currently known to infect humans.  If clinically indicated additional testing with an alternate test  methodology [phone]) is advised. The SARS-CoV-2 RNA is generally  detectable in upper and lower respiratory sp ecimens during the acute  phase of infection. The expected result is Negative. Fact Sheet for Patients:  StrictlyIdeas.no Fact Sheet for Healthcare Providers: BankingDealers.co.za This test is not yet approved or cleared by the [place] FDA and has been authorized for detection and/or diagnosis of SARS-CoV-2 by FDA under an Emergency Use Authorization (EUA).  This EUA will remain in effect (meaning this test can be used) for the duration of the COVID-19 declaration under Section 564(b)(1) of the Act, 21 U.S.C. section 360bbb-3(b)(1), unless the authorization is terminated or revoked sooner. Performed at [facility], [place] [address], [place],  [address]      Labs:   CBC: Recent Labs  Lab 10/31/18 2030  11/01/18 0151 11/02/18 0535 11/03/18 0531 11/04/18 0448 11/05/18 0518  WBC 7.5 8.1 8.3 6.6 5.3 5.3  NEUTROABS 6.5  --   --   --   --   --   HGB 10.9* 11.8* 10.2* 9.5* 9.4* 10.0*  HCT 35.5* 37.8 33.1* 31.4* 30.2* 32.4*  MCV 89.0 87.1 88.7 90.2 88.0 89.3  PLT 259 294 280 263 249 628   Basic Metabolic Panel: Recent Labs  Lab 11/01/18 0151 11/02/18 0535 11/03/18 0531 11/04/18 0448 11/05/18 0518  NA 140 141 140 139 139  K 3.9 3.8 3.7 3.6 3.6  CL 104 104 105 104 100  CO2 _0 GLUCOSE 319* 142* 91 92 101*  BUN 41* 56* 56* 53* 45*  CREATININE 1.51* 1.62* 1.75* 1.53* 1.44*  CALCIUM 9.5 9.3 9.0 9.0 9.0   Liver Function Tests: Recent Labs  Lab 10/31/18 1530  AST 14*  ALT 16  ALKPHOS 69  BILITOT 0.5  PROT 6.2*  ALBUMIN 3.0*   BNP (last 3 results) Recent Labs    09/17/18 1456 09/24/18 1844 10/31/18 1513  BNP 151.3* 434.7* 422.8*   Cardiac Enzymes: No results for input(s): CKTOTAL, CKMB, CKMBINDEX, TROPONINI in the last 168 hours. CBG: Recent Labs  Lab 11/04/18 0626 11/04/18 1235 11/04/18 1658 11/04/18 2113 11/05/18 0618  GLUCAP 98 188* 197* 131* 92   Urinalysis    Component Value Date/Time   COLORURINE YELLOW 01/02/2018 1452   APPEARANCEUR HAZY (A) 01/02/2018 1452   LABSPEC 1.039 (H) 01/02/2018 1452   PHURINE 6.0 01/02/2018 1452  GLUCOSEU >=500 (A) 01/02/2018 1452   HGBUR NEGATIVE 01/02/2018 1452   BILIRUBINUR NEGATIVE 01/02/2018 1452   BILIRUBINUR negative 12/13/2017 [place] 01/02/2018 1452   PROTEINUR >=300 (A) 01/02/2018 1452   UROBILINOGEN 0.2 12/13/2017 1514   NITRITE NEGATIVE 01/02/2018 1452   LEUKOCYTESUR NEGATIVE 01/02/2018 1452    Time coordinating discharge: Over 30 minutes  SIGNED: Deatra James, MD, FACP, FHM. [facility],  Pager [phone]  If 7PM-7AM, please contact night-coverage [email or web address] O[facility] 11/05/2018, 9:55 AM

## 2018-11-05 NOTE — Evaluation (Signed)
Physical Therapy Evaluation Patient Details Name: Kara Hanson MRN: [id number] DOB: Feb 13, 1956 Today's Date: 11/05/2018   History of Present Illness  Pt is a 63 y.o. female admitted with CAP and SOB. PMhx: admission to [facility] with COVID-19 from 07/12/18-07/25/18, Covid 19 PNA 09/24/18, HTN, CKD III, DM2, CHF, CAD, spinal stenosis, HLD, gout  Clinical Impression  On arrival pt caddy corner on bed with O2 off. Pt aroused and states that she frequently loses her O2 at night when sleeping and discussed need for maintained supplemental oxygen as sats 76% initially with rise to 97% on 3L. Pt educated for use of head scarf or hair pins to secure cannula for sleeping. Pt slightly fatigued and slow to process throughout session. Pt with decreased awareness of cardiopulmonary function, safety and balance who will benefit from acute therapy to maximize mobility, independence and function for return home. SpO2 90-94% during gait on 3L.     Follow Up Recommendations No PT follow up    Equipment Recommendations  None recommended by PT    Recommendations for Other Services       Precautions / Restrictions Precautions Precautions: Other (comment) Precaution Comments: watch sats      Mobility  Bed Mobility Overal bed mobility: Modified Independent                Transfers Overall transfer level: Modified independent                  Ambulation/Gait Ambulation/Gait assistance: Min guard Gait Distance (Feet): 400 Feet Assistive device: None Gait Pattern/deviations: Step-through pattern;Decreased stride length   Gait velocity interpretation: >2.62 ft/sec, indicative of community ambulatory General Gait Details: pt denied need or desire to use RW for gait this session and even with single UE support of therapist at times pt reaching out for environmental support to steady herself  Stairs Stairs: (pt declined attempting this session)          Wheelchair Mobility     Modified Rankin (Stroke Patients Only)       Balance Overall balance assessment: Mild deficits observed, not formally tested                                           Pertinent Vitals/Pain Pain Assessment: No/denies pain    Home Living Family/patient expects to be discharged to:: Private residence Living Arrangements: Children Available Help at Discharge: Family;Available 24 hours/day Type of Home: House Home Access: Stairs to enter Entrance Stairs-Rails: Right Entrance Stairs-Number of Steps: 4 Home Layout: Two level;Bed/bath upstairs Home Equipment: Walker - 2 wheels;Wheelchair - [employer];Shower seat      Prior Function Level of Independence: Independent         Comments: daughter does the housework and cooking, pt doesn't drive     Hand Dominance   Dominant Hand: Right    Extremity/Trunk Assessment   Upper Extremity Assessment Upper Extremity Assessment: Overall WFL for tasks assessed    Lower Extremity Assessment Lower Extremity Assessment: Overall WFL for tasks assessed    Cervical / Trunk Assessment Cervical / Trunk Assessment: Normal  Communication   Communication: No difficulties  Cognition Arousal/Alertness: Awake/alert Behavior During Therapy: Flat affect Overall Cognitive Status: Within Functional Limits for tasks assessed  General Comments      Exercises     Assessment/Plan    PT Assessment Patient needs continued PT services  PT Problem List Decreased mobility;Decreased activity tolerance;Decreased balance;Cardiopulmonary status limiting activity       PT Treatment Interventions Gait training;Therapeutic exercise;Patient/family education;Stair training;Balance training;Functional mobility training;Therapeutic activities    PT Goals (Current goals can be found in the Care Plan section)  Acute Rehab PT Goals Patient Stated Goal: return home PT  Goal Formulation: With patient Time For Goal Achievement: 11/12/18 Potential to Achieve Goals: Good    Frequency Min 3X/week   Barriers to discharge        Co-evaluation               AM-PAC PT "6 Clicks" Mobility  Outcome Measure Help needed turning from your back to your side while in a flat bed without using bedrails?: None Help needed moving from lying on your back to sitting on the side of a flat bed without using bedrails?: None Help needed moving to and from a bed to a chair (including a wheelchair)?: None Help needed standing up from a chair using your arms (e.g., wheelchair or bedside chair)?: None Help needed to walk in hospital room?: A Little Help needed climbing 3-5 steps with a railing? : A Little 6 Click Score: 22    End of Session Equipment Utilized During Treatment: Oxygen Activity Tolerance: Patient tolerated treatment well Patient left: in chair;with call bell/phone within reach Nurse Communication: Mobility status PT Visit Diagnosis: Other abnormalities of gait and mobility (R26.89);Muscle weakness (generalized) (M62.81)    Time: 8185-9093 PT Time Calculation (min) (ACUTE ONLY): 25 min   Charges:   PT Evaluation $PT Eval Moderate Complexity: 1 Mod          [place], PT Acute Rehabilitation Services Pager: [phone] Office: [phone]   Alfard Cochrane B Asalee Barrette 11/05/2018, 1:44 PM

## 2018-11-05 NOTE — Progress Notes (Signed)
Patient alert and oriented, denies pain, iv d/c, discharge instruction explain and given to the patient all questions answered. Will send patient home via taxi with oxygen.

## 2018-11-05 NOTE — Care Management (Signed)
1551 11-05-18 Patient wanted to leave the hospital via Taxi without oxygen. CM did call [employer] and patient is active with [employer]. CM did call Liaison Caryl Pina and oxygen to be delivered to hospital before she leaves. Patient then call Taxi to provide transportation home. No further needs from CM at this time. Bethena Roys, RN,BSN Case Manager [phone]

## 2018-11-06 ENCOUNTER — Telehealth: Payer: Self-pay

## 2018-11-06 NOTE — Telephone Encounter (Signed)
Transition Care Management Follow-up Telephone Call   Date of discharge and from where: 11/05/2018, [facility]  How have you been since you were released from the hospital? She stated that she is feeling much better and explained that she had to return to the hospital because she was having trouble breathing.   Any questions or concerns?no questions/concerns reported at this time.   Items Reviewed:  Did the pt receive and understand the discharge instructions provided? yes  Medications obtained and verified? She said that she is waiting for delivery of new medications from [employer].  She reports that she has all other medications and is taking them as ordered.  She did not want to review her medication list and did not have any questions. Encouraged her to call the clinic if questions arise.   Any new allergies since your discharge? None reported   Do you have support at home? She is living with her daughter,Kara Hanson  Other (ie: DME, Home Health, etc) has O2 from [place], using continuously at 3L/min. Has concentrator and POC.   Has glucometer. Blood sugar this morning 110  Needs a scale   Uses RW when needed.   Functional Questionnaire: (I = Independent and D = Dependent) ADL's:  independent, daughter will provide assistance when needed.    Follow up appointments reviewed:    PCP Hospital f/u appt confirmed? She preferred appointment with PCP v. Walk in provider.  Appointment scheduled with Dr Margarita Rana 11/14/2018 @ 1050. Informed her that she will be notified if televisit or in person.   [facility] f/u appt confirmed? None scheduled at this time.   Are transportation arrangements needed? Patient needs to rely on son for transportation.  Has also used cab   If their condition worsens, is the pt aware to call  their PCP or go to the ED?yes   Was the patient provided with contact information for the PCP's office or ED? She has the clinic phone number.  Was  the pt encouraged to call back with questions or concerns?yes

## 2018-11-14 ENCOUNTER — Ambulatory Visit: Payer: Medicare Other | Admitting: Family Medicine

## 2018-11-14 ENCOUNTER — Inpatient Hospital Stay: Payer: Medicare Other

## 2018-12-17 ENCOUNTER — Telehealth: Payer: Self-pay

## 2018-12-17 NOTE — Telephone Encounter (Signed)
Pt daughter called and requested a sooner appointment with Dr. Margarita Rana for edema in b/l feet. I informed pt daughter that Dr. Margarita Rana doesn't have any sooner appointments. I informed pt daughter that I can make her an appointment to see the walk-in physician on Wednesday. Pt daughter agree to the pt.   Pt daughter is requesting a referral to a podiatrist to get her feet cut. Please f/u

## 2018-12-18 NOTE — Telephone Encounter (Signed)
Just FYI. Patient has appointment with Levada Dy tomorrow.

## 2018-12-19 ENCOUNTER — Ambulatory Visit: Payer: Medicare Other

## 2019-01-08 ENCOUNTER — Other Ambulatory Visit: Payer: Self-pay

## 2019-01-08 ENCOUNTER — Ambulatory Visit: Payer: Medicare Other | Attending: Family Medicine | Admitting: Family Medicine

## 2019-01-08 DIAGNOSIS — Z794 Long term (current) use of insulin: Secondary | ICD-10-CM

## 2019-01-08 DIAGNOSIS — E1140 Type 2 diabetes mellitus with diabetic neuropathy, unspecified: Secondary | ICD-10-CM

## 2019-01-08 DIAGNOSIS — M5136 Other intervertebral disc degeneration, lumbar region: Secondary | ICD-10-CM

## 2019-01-08 DIAGNOSIS — M542 Cervicalgia: Secondary | ICD-10-CM | POA: Diagnosis not present

## 2019-01-08 DIAGNOSIS — M1A00X0 Idiopathic chronic gout, unspecified site, without tophus (tophi): Secondary | ICD-10-CM | POA: Diagnosis not present

## 2019-01-08 DIAGNOSIS — M5126 Other intervertebral disc displacement, lumbar region: Secondary | ICD-10-CM | POA: Diagnosis not present

## 2019-01-08 DIAGNOSIS — I10 Essential (primary) hypertension: Secondary | ICD-10-CM | POA: Diagnosis not present

## 2019-01-08 DIAGNOSIS — I5022 Chronic systolic (congestive) heart failure: Secondary | ICD-10-CM

## 2019-01-08 DIAGNOSIS — M4802 Spinal stenosis, cervical region: Secondary | ICD-10-CM | POA: Diagnosis not present

## 2019-01-08 MED ORDER — DULOXETINE HCL 60 MG PO CPEP: 60.0000 mg | ORAL_CAPSULE | Freq: Every day | ORAL | 3 refills | Status: DC

## 2019-01-08 MED ORDER — CARVEDILOL 12.5 MG PO TABS: 12.5000 mg | ORAL_TABLET | Freq: Two times a day (BID) | ORAL | 1 refills | Status: DC

## 2019-01-08 MED ORDER — GABAPENTIN 300 MG PO CAPS: 300.0000 mg | ORAL_CAPSULE | Freq: Two times a day (BID) | ORAL | 1 refills | Status: DC

## 2019-01-08 MED ORDER — ALLOPURINOL 300 MG PO TABS: 300.0000 mg | ORAL_TABLET | Freq: Every day | ORAL | 1 refills | Status: DC

## 2019-01-08 MED ORDER — ATORVASTATIN CALCIUM 80 MG PO TABS: 80.0000 mg | ORAL_TABLET | Freq: Every day | ORAL | 1 refills | Status: DC

## 2019-01-08 MED ORDER — FUROSEMIDE 40 MG PO TABS: 40.0000 mg | ORAL_TABLET | Freq: Every day | ORAL | 1 refills | Status: DC

## 2019-01-08 MED ORDER — AMLODIPINE BESYLATE 5 MG PO TABS: 5.0000 mg | ORAL_TABLET | Freq: Every day | ORAL | 1 refills | Status: DC

## 2019-01-08 NOTE — Progress Notes (Signed)
Virtual Visit via Telephone Note  I connected with Kara Hanson, on 01/08/2019 at 2:04 PM by telephone due to the COVID-19 pandemic and verified that I am speaking with the correct person using two identifiers.   Consent: I discussed the limitations, risks, security and privacy concerns of performing an evaluation and management service by telephone and the availability of in person appointments. I also discussed with the patient that there may be a patient responsible charge related to this service. The patient expressed understanding and agreed to proceed.   Location of Patient: Home  Location of Provider: Clinic  Persons participating in Telemedicine visit: Makya Phillis Farrington-CMA Dr. Felecia Shelling     History of Present Illness: Kara Hanson is a 63 year old female with a history of type 2 diabetes mellitus (A1c 7.2), hypertension, congestive heart failure (EF 50% from echo of 10/2018, COVID-19 related pneumonia in 07/2018 seen for a follow up visit. In 7/202 she had a hospitalization for CHF exacerbation and reports feeling better after.  She complains of dyspnea which is chronic and she remains on 3L of oxygen. Denies pedal edema at the time. Does not check her weight as she has no scale. States she has not followed with Cardiology recently as she was advised to return in 1 year after her 10/2018 visit but she endorses compliance with Lasix. Denies chest pain, palpitations. She complains of tremors in her legs, she feels she has to drag her foot when going up stairs as her legs are week. Her back pain is controlled on current medications and she denies recent falls. Her sugars have been in the 150-200. Denies hypoglycemia.  She has had no Gout flares and her lower back pain is stable. She is currently staying with her daughter [name] but is planning to move into her own place.  Past Medical History:  Diagnosis Date  . CHF (congestive heart failure) ([place])   . CVA  (cerebral vascular accident) ([place])   . Diabetes mellitus without complication ([place])   . Hypercholesteremia   . Hypertension   . Myocardial infarction ([place])   . Pneumonia 11/01/2018  . Spinal stenosis    Allergies  Allergen Reactions  . Garlic Shortness Of Breath, Itching and Swelling    Hand itching and swelling  . Latex Itching  . Morphine And Related Itching and Other (See Comments)    Headache   . Other Itching    Reaction to newspaper ink -itching and headache    Current Outpatient Medications on File Prior to Visit  Medication Sig Dispense Refill  . Accu-Chek FastClix Lancets MISC USE AS DIRECTED TO TEST BLOOD SUGAR THREE TIMES DAILY 102 each 12  . allopurinol (ZYLOPRIM) 300 MG tablet Take 1 tablet (300 mg total) by mouth daily. 90 tablet 1  . aspirin EC 81 MG tablet Take 81 mg by mouth daily.    . Blood Glucose Monitoring Suppl (ACCU-CHEK GUIDE) w/Device KIT 1 each by Does not apply route 3 (three) times daily. 1 kit 0  . carvedilol (COREG) 12.5 MG tablet Take 1 tablet (12.5 mg total) by mouth 2 (two) times daily with a meal. 180 tablet 1  . EASY COMFORT PEN NEEDLES 31G X 5 MM MISC USE FOUR TIMES PER DAY FOR INSULIN ADMINISTRATION (Patient taking differently: 4 (four) times daily. ) 200 each 12  . ergocalciferol (DRISDOL) 1.25 MG (50000 UT) capsule Take 1 capsule (50,000 Units total) by mouth once a week. 9 capsule 1  . furosemide (LASIX) 40 MG tablet Take  1 tablet (40 mg total) by mouth daily. 30 tablet 0  . glucose blood (ACCU-CHEK GUIDE) test strip USE AS DIRECTED TO TEST BLOOD SUGAR THREE TIMES DAILY 100 each 12  . insulin aspart (NOVOLOG) 100 UNIT/ML injection 0 to 12 units subcutaneously 3 times daily before meals as per sliding scale 30 mL 6  . Insulin Glargine (BASAGLAR KWIKPEN) 100 UNIT/ML SOPN Inject 0.05 mLs (5 Units total) into the skin at bedtime. 30 mL 6  . lactobacillus acidophilus & bulgar (LACTINEX) chewable tablet Chew 1 tablet by mouth 3 (three) times daily  with meals. 10 tablet 0  . Lancet Device MISC Use as instructed 3 times daily 1 each 0  . Misc. Devices MISC Portable oxygen concentrator.  Diagnosis-chronic respiratory failure. 1 each 0  . Misc. Devices MISC [place] with seat. Dx: Congestive Heart Failure 1 each 0  . omeprazole (PRILOSEC) 20 MG capsule Take 20 mg by mouth daily.    . ondansetron (ZOFRAN) 4 MG tablet Take 1 tablet (4 mg total) by mouth every 6 (six) hours as needed for nausea. 20 tablet 0  . polyethylene glycol (MIRALAX / GLYCOLAX) 17 g packet Take 17 g by mouth daily. 14 each 0  . SUPER B COMPLEX/C CAPS Take 1 capsule by mouth daily.    Marland Kitchen amLODipine (NORVASC) 5 MG tablet TAKE 1 TABLET (5 MG TOTAL) BY MOUTH AT BEDTIME. 90 tablet 0  . atorvastatin (LIPITOR) 80 MG tablet Take 1 tablet (80 mg total) by mouth daily at 6 PM for 30 days. 90 tablet 1  . DULoxetine (CYMBALTA) 60 MG capsule Take 1 capsule (60 mg total) by mouth daily. For chronic back and neck pain (Patient not taking: Reported on 01/08/2019) 30 capsule 3  . gabapentin (NEURONTIN) 300 MG capsule TAKE 1 CAPSULE (300 MG TOTAL) BY MOUTH 2 (TWO) TIMES DAILY. 180 capsule 1  . guaiFENesin-dextromethorphan (ROBITUSSIN DM) 100-10 MG/5ML syrup Take 10 mLs by mouth every 6 (six) hours. (Patient not taking: Reported on 01/08/2019) 118 mL 0  . methylPREDNISolone (MEDROL) 4 MG tablet Take 1 tablet (4 mg total) by mouth daily. Take medication as directed per dose pack (Patient not taking: Reported on 01/08/2019) 21 tablet 0   No current facility-administered medications on file prior to visit.     Observations/Objective: Awake, alert, oriented x3 Not in acute distress  Lab Results  Component Value Date   HGBA1C 7.2 (A) 09/17/2018    CMP Latest Ref Rng & Units 11/05/2018 11/04/2018 11/03/2018  Glucose 70 - 99 mg/dL 101(H) 92 91  BUN 8 - 23 mg/dL 45(H) 53(H) 56(H)  Creatinine 0.44 - 1.00 mg/dL 1.44(H) 1.53(H) 1.75(H)  Sodium 135 - 145 mmol/L 139 139 140  Potassium 3.5 - 5.1 mmol/L  3.6 3.6 3.7  Chloride 98 - 111 mmol/L 100 104 105  CO2 22 - 32 mmol/L 29 28 28   Calcium 8.9 - 10.3 mg/dL 9.0 9.0 9.0  Total Protein 6.5 - 8.1 g/dL - - -  Total Bilirubin 0.3 - 1.2 mg/dL - - -  Alkaline Phos 38 - 126 U/L - - -  AST 15 - 41 U/L - - -  ALT 0 - 44 U/L - - -    Lipid Panel     Component Value Date/Time   CHOL 295 (H) 03/24/2018 0551   TRIG 137 07/20/2018 1803   HDL 45 03/24/2018 0551   CHOLHDL 6.6 03/24/2018 0551   VLDL 35 03/24/2018 0551   LDLCALC 215 (H) 03/24/2018 9509  Assessment and Plan: 1. Idiopathic chronic gout without tophus, unspecified site Stable - allopurinol (ZYLOPRIM) 300 MG tablet; Take 1 tablet (300 mg total) by mouth daily.  Dispense: 90 tablet; Refill: 1  2. Type 2 diabetes mellitus with diabetic neuropathy, with long-term current use of insulin (HCC) Controlled with A1c of 7.2 Continue current regimen Counseled on Diabetic diet, my plate method, 470 minutes of moderate intensity exercise/week Blood sugar logs with fasting goals of 80-120 mg/dl, random of less than 180 and in the event of sugars less than 60 mg/dl or greater than 400 mg/dl encouraged to notify the clinic. Advised on the need for annual eye exams, annual foot exams, Pneumonia vaccine. - Lipid panel; Future - Basic Metabolic Panel; Future - Hemoglobin A1c; Future - atorvastatin (LIPITOR) 80 MG tablet; Take 1 tablet (80 mg total) by mouth daily at 6 PM.  Dispense: 90 tablet; Refill: 1  3. Essential hypertension Stable Continue current antihypertensives Counseled on blood pressure goal of less than 130/80, low-sodium, DASH diet, medication compliance, 150 minutes of moderate intensity exercise per week. Discussed medication compliance, adverse effects. - carvedilol (COREG) 12.5 MG tablet; Take 1 tablet (12.5 mg total) by mouth 2 (two) times daily with a meal.  Dispense: 180 tablet; Refill: 1  4. Chronic systolic congestive heart failure (HCC) EF 50% from 10/2018 Advised to  obtain a scale to keep a daily weight log Also to take an extra Lasix pill if fluid overloaded Continue 2L Oxygen - Brain natriuretic peptide; Future - furosemide (LASIX) 40 MG tablet; Take 1 tablet (40 mg total) by mouth daily.  Dispense: 90 tablet; Refill: 1  5. Bulging lumbar disc Stable - gabapentin (NEURONTIN) 300 MG capsule; Take 1 capsule (300 mg total) by mouth 2 (two) times daily.  Dispense: 180 capsule; Refill: 1 - DULoxetine (CYMBALTA) 60 MG capsule; Take 1 capsule (60 mg total) by mouth daily. For chronic back and neck pain  Dispense: 30 capsule; Refill: 3  6. Spinal stenosis of cervical region Leg weakness has been progressive [facility] PT referral which she declines due to current living situation Discussed fall precautions - gabapentin (NEURONTIN) 300 MG capsule; Take 1 capsule (300 mg total) by mouth 2 (two) times daily.  Dispense: 180 capsule; Refill: 1  7. Neck pain - DULoxetine (CYMBALTA) 60 MG capsule; Take 1 capsule (60 mg total) by mouth daily. For chronic back and neck pain  Dispense: 30 capsule; Refill: 3   Follow Up Instructions: Return in about 3 months (around 04/10/2019) for Medical conditions.    I discussed the assessment and treatment plan with the patient. The patient was provided an opportunity to ask questions and all were answered. The patient agreed with the plan and demonstrated an understanding of the instructions.   The patient was advised to call back or seek an in-person evaluation if the symptoms worsen or if the condition fails to improve as anticipated.     I provided 22 minutes total of non-face-to-face time during this encounter including median intraservice time, reviewing previous notes, labs, imaging, medications, management and patient verbalized understanding.     [name], MD, FAAFP. [facility] and [place], [place]   01/08/2019, 2:04 PM

## 2019-01-08 NOTE — Progress Notes (Signed)
Patient has been called and DOB has been verified. Patient has been screened and transferred to PCP to start phone visit.   Patient states that she is having trouble breathing.  Patient states that her legs shake.  Patient needs refills on medications.

## 2019-01-09 ENCOUNTER — Other Ambulatory Visit: Payer: Self-pay

## 2019-01-09 ENCOUNTER — Encounter: Payer: Self-pay | Admitting: Family Medicine

## 2019-01-09 ENCOUNTER — Ambulatory Visit: Payer: Medicare Other | Attending: Family Medicine

## 2019-01-09 DIAGNOSIS — E1140 Type 2 diabetes mellitus with diabetic neuropathy, unspecified: Secondary | ICD-10-CM | POA: Diagnosis not present

## 2019-01-09 DIAGNOSIS — I5022 Chronic systolic (congestive) heart failure: Secondary | ICD-10-CM

## 2019-01-09 DIAGNOSIS — Z794 Long term (current) use of insulin: Secondary | ICD-10-CM | POA: Diagnosis not present

## 2019-01-10 LAB — BASIC METABOLIC PANEL
BUN/Creatinine Ratio: 25 (ref 12–28)
BUN: 39 mg/dL — ABNORMAL HIGH (ref 8–27)
CO2: 28 mmol/L (ref 20–29)
Calcium: 9.8 mg/dL (ref 8.7–10.3)
Chloride: 100 mmol/L (ref 96–106)
Creatinine, Ser: 1.56 mg/dL — ABNORMAL HIGH (ref 0.57–1.00)
GFR calc Af Amer: 40 mL/min/{1.73_m2} — ABNORMAL LOW (ref >59)
GFR calc non Af Amer: 35 mL/min/{1.73_m2} — ABNORMAL LOW (ref >59)
Glucose: 218 mg/dL — ABNORMAL HIGH (ref 65–99)
Potassium: 4.4 mmol/L (ref 3.5–5.2)
Sodium: 143 mmol/L (ref 134–144)

## 2019-01-10 LAB — LIPID PANEL
Chol/HDL Ratio: 6.8 ratio — ABNORMAL HIGH (ref 0.0–4.4)
Cholesterol, Total: 353 mg/dL — ABNORMAL HIGH (ref 100–199)
HDL: 52 mg/dL (ref >39)
LDL Chol Calc (NIH): 251 mg/dL — ABNORMAL HIGH (ref 0–99)
Triglycerides: 241 mg/dL — ABNORMAL HIGH (ref 0–149)
VLDL Cholesterol Cal: 50 mg/dL — ABNORMAL HIGH (ref 5–40)

## 2019-01-10 LAB — HEMOGLOBIN A1C
Est. average glucose Bld gHb Est-mCnc: 177 mg/dL
Hgb A1c MFr Bld: 7.8 % — ABNORMAL HIGH (ref 4.8–5.6)

## 2019-01-10 LAB — BRAIN NATRIURETIC PEPTIDE: BNP: 539 pg/mL — ABNORMAL HIGH (ref 0.0–100.0)

## 2019-01-11 ENCOUNTER — Other Ambulatory Visit: Payer: Self-pay | Admitting: Family Medicine

## 2019-01-11 DIAGNOSIS — I10 Essential (primary) hypertension: Secondary | ICD-10-CM

## 2019-01-14 ENCOUNTER — Telehealth: Payer: Self-pay

## 2019-01-14 NOTE — Telephone Encounter (Signed)
-----   Message from [name], MD sent at 01/11/2019  9:36 AM EDT ----- Labs reveal diabetes is better controlled with A1c of 7.8.  It also reveals she is in fluid overload and I will need her to increase her Lasix to 60 mg twice daily.  (one and a half of her regular 40mg  tabs twice daily).  Please schedule for comprehensive metabolic panel in 2 weeks; I have placed order for this.  Thank you

## 2019-01-14 NOTE — Telephone Encounter (Signed)
Patent was called and informed of lab results. Patient was given lab appointment for 10/28 and patient is aware of results.

## 2019-01-23 ENCOUNTER — Encounter (HOSPITAL_COMMUNITY): Payer: Self-pay

## 2019-01-23 ENCOUNTER — Inpatient Hospital Stay (HOSPITAL_COMMUNITY)
Admission: EM | Admit: 2019-01-23 | Discharge: 2019-02-07 | DRG: 291 | Disposition: A | Discharge status: Home Health | Payer: Medicare Other | Attending: Internal Medicine | Admitting: Internal Medicine

## 2019-01-23 ENCOUNTER — Emergency Department (HOSPITAL_COMMUNITY): Payer: Medicare Other

## 2019-01-23 ENCOUNTER — Other Ambulatory Visit: Payer: Self-pay

## 2019-01-23 DIAGNOSIS — N183 Chronic kidney disease, stage 3 unspecified: Secondary | ICD-10-CM

## 2019-01-23 DIAGNOSIS — Z885 Allergy status to narcotic agent status: Secondary | ICD-10-CM

## 2019-01-23 DIAGNOSIS — I429 Cardiomyopathy, unspecified: Secondary | ICD-10-CM | POA: Diagnosis present

## 2019-01-23 DIAGNOSIS — I130 Hypertensive heart and chronic kidney disease with heart failure and stage 1 through stage 4 chronic kidney disease, or unspecified chronic kidney disease: Principal | ICD-10-CM | POA: Diagnosis present

## 2019-01-23 DIAGNOSIS — R4182 Altered mental status, unspecified: Secondary | ICD-10-CM | POA: Diagnosis not present

## 2019-01-23 DIAGNOSIS — E1140 Type 2 diabetes mellitus with diabetic neuropathy, unspecified: Secondary | ICD-10-CM

## 2019-01-23 DIAGNOSIS — I2510 Atherosclerotic heart disease of native coronary artery without angina pectoris: Secondary | ICD-10-CM | POA: Diagnosis present

## 2019-01-23 DIAGNOSIS — K5730 Diverticulosis of large intestine without perforation or abscess without bleeding: Secondary | ICD-10-CM | POA: Diagnosis not present

## 2019-01-23 DIAGNOSIS — I509 Heart failure, unspecified: Secondary | ICD-10-CM | POA: Diagnosis not present

## 2019-01-23 DIAGNOSIS — R1314 Dysphagia, pharyngoesophageal phase: Secondary | ICD-10-CM | POA: Diagnosis present

## 2019-01-23 DIAGNOSIS — E7800 Pure hypercholesterolemia, unspecified: Secondary | ICD-10-CM | POA: Diagnosis present

## 2019-01-23 DIAGNOSIS — I361 Nonrheumatic tricuspid (valve) insufficiency: Secondary | ICD-10-CM | POA: Diagnosis not present

## 2019-01-23 DIAGNOSIS — E785 Hyperlipidemia, unspecified: Secondary | ICD-10-CM | POA: Diagnosis present

## 2019-01-23 DIAGNOSIS — Z4682 Encounter for fitting and adjustment of non-vascular catheter: Secondary | ICD-10-CM | POA: Diagnosis not present

## 2019-01-23 DIAGNOSIS — J9621 Acute and chronic respiratory failure with hypoxia: Secondary | ICD-10-CM | POA: Diagnosis not present

## 2019-01-23 DIAGNOSIS — R1013 Epigastric pain: Secondary | ICD-10-CM

## 2019-01-23 DIAGNOSIS — Z8619 Personal history of other infectious and parasitic diseases: Secondary | ICD-10-CM | POA: Diagnosis not present

## 2019-01-23 DIAGNOSIS — R918 Other nonspecific abnormal finding of lung field: Secondary | ICD-10-CM | POA: Diagnosis not present

## 2019-01-23 DIAGNOSIS — K429 Umbilical hernia without obstruction or gangrene: Secondary | ICD-10-CM | POA: Diagnosis not present

## 2019-01-23 DIAGNOSIS — D631 Anemia in chronic kidney disease: Secondary | ICD-10-CM | POA: Diagnosis present

## 2019-01-23 DIAGNOSIS — J948 Other specified pleural conditions: Secondary | ICD-10-CM | POA: Diagnosis not present

## 2019-01-23 DIAGNOSIS — Z20828 Contact with and (suspected) exposure to other viral communicable diseases: Secondary | ICD-10-CM | POA: Diagnosis present

## 2019-01-23 DIAGNOSIS — J9610 Chronic respiratory failure, unspecified whether with hypoxia or hypercapnia: Secondary | ICD-10-CM

## 2019-01-23 DIAGNOSIS — I340 Nonrheumatic mitral (valve) insufficiency: Secondary | ICD-10-CM | POA: Diagnosis not present

## 2019-01-23 DIAGNOSIS — I129 Hypertensive chronic kidney disease with stage 1 through stage 4 chronic kidney disease, or unspecified chronic kidney disease: Secondary | ICD-10-CM | POA: Diagnosis not present

## 2019-01-23 DIAGNOSIS — R251 Tremor, unspecified: Secondary | ICD-10-CM | POA: Diagnosis present

## 2019-01-23 DIAGNOSIS — I5043 Acute on chronic combined systolic (congestive) and diastolic (congestive) heart failure: Secondary | ICD-10-CM | POA: Diagnosis not present

## 2019-01-23 DIAGNOSIS — K2971 Gastritis, unspecified, with bleeding: Secondary | ICD-10-CM | POA: Diagnosis not present

## 2019-01-23 DIAGNOSIS — E11649 Type 2 diabetes mellitus with hypoglycemia without coma: Secondary | ICD-10-CM | POA: Diagnosis not present

## 2019-01-23 DIAGNOSIS — K2960 Other gastritis without bleeding: Secondary | ICD-10-CM | POA: Diagnosis present

## 2019-01-23 DIAGNOSIS — Z823 Family history of stroke: Secondary | ICD-10-CM

## 2019-01-23 DIAGNOSIS — Z9289 Personal history of other medical treatment: Secondary | ICD-10-CM

## 2019-01-23 DIAGNOSIS — Z91018 Allergy to other foods: Secondary | ICD-10-CM

## 2019-01-23 DIAGNOSIS — N179 Acute kidney failure, unspecified: Secondary | ICD-10-CM | POA: Diagnosis present

## 2019-01-23 DIAGNOSIS — E1165 Type 2 diabetes mellitus with hyperglycemia: Secondary | ICD-10-CM | POA: Diagnosis not present

## 2019-01-23 DIAGNOSIS — I50814 Right heart failure due to left heart failure: Secondary | ICD-10-CM | POA: Diagnosis not present

## 2019-01-23 DIAGNOSIS — Z7982 Long term (current) use of aspirin: Secondary | ICD-10-CM | POA: Diagnosis not present

## 2019-01-23 DIAGNOSIS — U071 COVID-19: Secondary | ICD-10-CM | POA: Diagnosis not present

## 2019-01-23 DIAGNOSIS — Z743 Need for continuous supervision: Secondary | ICD-10-CM | POA: Diagnosis not present

## 2019-01-23 DIAGNOSIS — Z794 Long term (current) use of insulin: Secondary | ICD-10-CM

## 2019-01-23 DIAGNOSIS — Z9889 Other specified postprocedural states: Secondary | ICD-10-CM

## 2019-01-23 DIAGNOSIS — N390 Urinary tract infection, site not specified: Secondary | ICD-10-CM | POA: Diagnosis not present

## 2019-01-23 DIAGNOSIS — Z833 Family history of diabetes mellitus: Secondary | ICD-10-CM | POA: Diagnosis not present

## 2019-01-23 DIAGNOSIS — J90 Pleural effusion, not elsewhere classified: Secondary | ICD-10-CM | POA: Diagnosis not present

## 2019-01-23 DIAGNOSIS — M4800 Spinal stenosis, site unspecified: Secondary | ICD-10-CM | POA: Diagnosis present

## 2019-01-23 DIAGNOSIS — M109 Gout, unspecified: Secondary | ICD-10-CM | POA: Diagnosis present

## 2019-01-23 DIAGNOSIS — I5022 Chronic systolic (congestive) heart failure: Secondary | ICD-10-CM

## 2019-01-23 DIAGNOSIS — J189 Pneumonia, unspecified organism: Secondary | ICD-10-CM | POA: Diagnosis not present

## 2019-01-23 DIAGNOSIS — T17890A Other foreign object in other parts of respiratory tract causing asphyxiation, initial encounter: Secondary | ICD-10-CM | POA: Diagnosis not present

## 2019-01-23 DIAGNOSIS — Z91040 Latex allergy status: Secondary | ICD-10-CM

## 2019-01-23 DIAGNOSIS — I10 Essential (primary) hypertension: Secondary | ICD-10-CM | POA: Diagnosis not present

## 2019-01-23 DIAGNOSIS — K2950 Unspecified chronic gastritis without bleeding: Secondary | ICD-10-CM | POA: Diagnosis not present

## 2019-01-23 DIAGNOSIS — Z9981 Dependence on supplemental oxygen: Secondary | ICD-10-CM

## 2019-01-23 DIAGNOSIS — E1122 Type 2 diabetes mellitus with diabetic chronic kidney disease: Secondary | ICD-10-CM | POA: Diagnosis present

## 2019-01-23 DIAGNOSIS — R531 Weakness: Secondary | ICD-10-CM | POA: Diagnosis not present

## 2019-01-23 DIAGNOSIS — J9811 Atelectasis: Secondary | ICD-10-CM

## 2019-01-23 DIAGNOSIS — K8689 Other specified diseases of pancreas: Secondary | ICD-10-CM | POA: Diagnosis present

## 2019-01-23 DIAGNOSIS — J811 Chronic pulmonary edema: Secondary | ICD-10-CM | POA: Diagnosis not present

## 2019-01-23 DIAGNOSIS — I252 Old myocardial infarction: Secondary | ICD-10-CM | POA: Diagnosis not present

## 2019-01-23 DIAGNOSIS — R0600 Dyspnea, unspecified: Secondary | ICD-10-CM

## 2019-01-23 DIAGNOSIS — J942 Hemothorax: Secondary | ICD-10-CM | POA: Diagnosis not present

## 2019-01-23 DIAGNOSIS — N289 Disorder of kidney and ureter, unspecified: Secondary | ICD-10-CM | POA: Diagnosis not present

## 2019-01-23 DIAGNOSIS — R279 Unspecified lack of coordination: Secondary | ICD-10-CM | POA: Diagnosis not present

## 2019-01-23 DIAGNOSIS — K5900 Constipation, unspecified: Secondary | ICD-10-CM | POA: Diagnosis not present

## 2019-01-23 DIAGNOSIS — R0602 Shortness of breath: Secondary | ICD-10-CM | POA: Diagnosis not present

## 2019-01-23 DIAGNOSIS — I110 Hypertensive heart disease with heart failure: Secondary | ICD-10-CM | POA: Diagnosis not present

## 2019-01-23 LAB — CBC WITH DIFFERENTIAL/PLATELET
Abs Immature Granulocytes: 0.01 10*3/uL (ref 0.00–0.07)
Basophils Absolute: 0 10*3/uL (ref 0.0–0.1)
Basophils Relative: 0 %
Eosinophils Absolute: 0.1 10*3/uL (ref 0.0–0.5)
Eosinophils Relative: 2 %
HCT: 31.8 % — ABNORMAL LOW (ref 36.0–46.0)
Hemoglobin: 9.5 g/dL — ABNORMAL LOW (ref 12.0–15.0)
Immature Granulocytes: 0 %
Lymphocytes Relative: 10 %
Lymphs Abs: 0.5 10*3/uL — ABNORMAL LOW (ref 0.7–4.0)
MCH: 27.3 pg (ref 26.0–34.0)
MCHC: 29.9 g/dL — ABNORMAL LOW (ref 30.0–36.0)
MCV: 91.4 fL (ref 80.0–100.0)
Monocytes Absolute: 0.4 10*3/uL (ref 0.1–1.0)
Monocytes Relative: 8 %
Neutro Abs: 3.7 10*3/uL (ref 1.7–7.7)
Neutrophils Relative %: 80 %
Platelets: 207 10*3/uL (ref 150–400)
RBC: 3.48 MIL/uL — ABNORMAL LOW (ref 3.87–5.11)
RDW: 14.9 % (ref 11.5–15.5)
WBC: 4.7 10*3/uL (ref 4.0–10.5)
nRBC: 0 % (ref 0.0–0.2)

## 2019-01-23 LAB — SARS CORONAVIRUS 2 (TAT 6-24 HRS): SARS Coronavirus 2: NEGATIVE

## 2019-01-23 LAB — TROPONIN I (HIGH SENSITIVITY)
Troponin I (High Sensitivity): 10 ng/L (ref <18)
Troponin I (High Sensitivity): 11 ng/L (ref <18)
Troponin I (High Sensitivity): 13 ng/L (ref <18)

## 2019-01-23 LAB — COMPREHENSIVE METABOLIC PANEL
ALT: 11 U/L (ref 0–44)
AST: 14 U/L — ABNORMAL LOW (ref 15–41)
Albumin: 2.8 g/dL — ABNORMAL LOW (ref 3.5–5.0)
Alkaline Phosphatase: 65 U/L (ref 38–126)
Anion gap: 10 (ref 5–15)
BUN: 37 mg/dL — ABNORMAL HIGH (ref 8–23)
CO2: 29 mmol/L (ref 22–32)
Calcium: 9.1 mg/dL (ref 8.9–10.3)
Chloride: 98 mmol/L (ref 98–111)
Creatinine, Ser: 1.74 mg/dL — ABNORMAL HIGH (ref 0.44–1.00)
GFR calc Af Amer: 35 mL/min — ABNORMAL LOW (ref >60)
GFR calc non Af Amer: 30 mL/min — ABNORMAL LOW (ref >60)
Glucose, Bld: 307 mg/dL — ABNORMAL HIGH (ref 70–99)
Potassium: 4.4 mmol/L (ref 3.5–5.1)
Sodium: 137 mmol/L (ref 135–145)
Total Bilirubin: 0.3 mg/dL (ref 0.3–1.2)
Total Protein: 6 g/dL — ABNORMAL LOW (ref 6.5–8.1)

## 2019-01-23 LAB — CBG MONITORING, ED: Glucose-Capillary: 137 mg/dL — ABNORMAL HIGH (ref 70–99)

## 2019-01-23 LAB — BRAIN NATRIURETIC PEPTIDE: B Natriuretic Peptide: 423.4 pg/mL — ABNORMAL HIGH (ref 0.0–100.0)

## 2019-01-23 MED ORDER — INSULIN GLARGINE 100 UNIT/ML ~~LOC~~ SOLN
10.0000 [IU] | Freq: Every day | SUBCUTANEOUS | Status: DC
  Administered 2019-01-25 – 2019-02-01 (×5): 10 [IU] via SUBCUTANEOUS
  Filled 2019-01-23 (×11): qty 0.1

## 2019-01-23 MED ORDER — POTASSIUM CHLORIDE CRYS ER 20 MEQ PO TBCR
20.0000 meq | EXTENDED_RELEASE_TABLET | Freq: Once | ORAL | Status: AC
  Administered 2019-01-23: 15:00:00 20 meq via ORAL
  Filled 2019-01-23: qty 1

## 2019-01-23 MED ORDER — INSULIN ASPART 100 UNIT/ML ~~LOC~~ SOLN
0.0000 [IU] | Freq: Every day | SUBCUTANEOUS | Status: DC
  Filled 2019-01-23: qty 0.05

## 2019-01-23 MED ORDER — POTASSIUM CHLORIDE CRYS ER 10 MEQ PO TBCR
10.0000 meq | EXTENDED_RELEASE_TABLET | Freq: Two times a day (BID) | ORAL | Status: DC
  Administered 2019-01-23 – 2019-01-24 (×3): 10 meq via ORAL
  Filled 2019-01-23 (×3): qty 1

## 2019-01-23 MED ORDER — CARVEDILOL 12.5 MG PO TABS
12.5000 mg | ORAL_TABLET | Freq: Two times a day (BID) | ORAL | Status: DC
  Administered 2019-01-23 – 2019-02-04 (×22): 12.5 mg via ORAL
  Filled 2019-01-23 (×10): qty 1
  Filled 2019-01-23: qty 2
  Filled 2019-01-23 (×3): qty 1
  Filled 2019-01-23: qty 2
  Filled 2019-01-23 (×8): qty 1

## 2019-01-23 MED ORDER — ENOXAPARIN SODIUM 30 MG/0.3ML ~~LOC~~ SOLN
30.0000 mg | SUBCUTANEOUS | Status: DC
  Administered 2019-01-23 – 2019-02-06 (×15): 30 mg via SUBCUTANEOUS
  Filled 2019-01-23 (×15): qty 0.3

## 2019-01-23 MED ORDER — DULOXETINE HCL 60 MG PO CPEP
60.0000 mg | ORAL_CAPSULE | Freq: Every day | ORAL | Status: DC
  Administered 2019-01-24 – 2019-01-30 (×7): 60 mg via ORAL
  Filled 2019-01-23 (×7): qty 1

## 2019-01-23 MED ORDER — ATORVASTATIN CALCIUM 40 MG PO TABS
80.0000 mg | ORAL_TABLET | Freq: Every day | ORAL | Status: DC
  Administered 2019-01-24 – 2019-02-07 (×13): 80 mg via ORAL
  Filled 2019-01-23 (×14): qty 2

## 2019-01-23 MED ORDER — FUROSEMIDE 10 MG/ML IJ SOLN: 40.0000 mg | Freq: Two times a day (BID) | INTRAMUSCULAR | Status: DC

## 2019-01-23 MED ORDER — INSULIN ASPART 100 UNIT/ML ~~LOC~~ SOLN
0.0000 [IU] | Freq: Three times a day (TID) | SUBCUTANEOUS | Status: DC
  Administered 2019-01-24: 2 [IU] via SUBCUTANEOUS
  Administered 2019-01-24 (×2): 3 [IU] via SUBCUTANEOUS
  Administered 2019-02-02: 2 [IU] via SUBCUTANEOUS
  Administered 2019-02-03 (×2): 3 [IU] via SUBCUTANEOUS
  Administered 2019-02-04: 2 [IU] via SUBCUTANEOUS
  Administered 2019-02-04: 3 [IU] via SUBCUTANEOUS
  Administered 2019-02-04 – 2019-02-05 (×3): 2 [IU] via SUBCUTANEOUS
  Administered 2019-02-05: 5 [IU] via SUBCUTANEOUS
  Administered 2019-02-06: 2 [IU] via SUBCUTANEOUS
  Administered 2019-02-06: 3 [IU] via SUBCUTANEOUS
  Administered 2019-02-06: 5 [IU] via SUBCUTANEOUS
  Administered 2019-02-07: 2 [IU] via SUBCUTANEOUS
  Administered 2019-02-07: 3 [IU] via SUBCUTANEOUS
  Filled 2019-01-23: qty 0.15

## 2019-01-23 MED ORDER — GABAPENTIN 300 MG PO CAPS
300.0000 mg | ORAL_CAPSULE | Freq: Two times a day (BID) | ORAL | Status: DC
  Administered 2019-01-23 – 2019-01-31 (×17): 300 mg via ORAL
  Filled 2019-01-23 (×17): qty 1

## 2019-01-23 MED ORDER — ASPIRIN EC 81 MG PO TBEC
81.0000 mg | DELAYED_RELEASE_TABLET | Freq: Every day | ORAL | Status: DC
  Administered 2019-01-24 – 2019-02-07 (×13): 81 mg via ORAL
  Filled 2019-01-23 (×13): qty 1

## 2019-01-23 MED ORDER — ALLOPURINOL 100 MG PO TABS
150.0000 mg | ORAL_TABLET | Freq: Every day | ORAL | Status: DC
  Administered 2019-01-24 – 2019-02-07 (×14): 150 mg via ORAL
  Filled 2019-01-23: qty 2
  Filled 2019-01-23: qty 1
  Filled 2019-01-23: qty 2
  Filled 2019-01-23 (×6): qty 1
  Filled 2019-01-23 (×2): qty 2
  Filled 2019-01-23: qty 1
  Filled 2019-01-23 (×2): qty 2
  Filled 2019-01-23: qty 1

## 2019-01-23 MED ORDER — FUROSEMIDE 10 MG/ML IJ SOLN
40.0000 mg | Freq: Once | INTRAMUSCULAR | Status: AC
  Administered 2019-01-23: 40 mg via INTRAVENOUS
  Filled 2019-01-23: qty 4

## 2019-01-23 MED ORDER — AMLODIPINE BESYLATE 5 MG PO TABS
5.0000 mg | ORAL_TABLET | Freq: Every day | ORAL | Status: DC
  Administered 2019-01-23: 5 mg via ORAL
  Filled 2019-01-23: qty 1

## 2019-01-23 MED ORDER — FUROSEMIDE 10 MG/ML IJ SOLN
60.0000 mg | Freq: Two times a day (BID) | INTRAMUSCULAR | Status: DC
  Administered 2019-01-24 – 2019-01-25 (×3): 60 mg via INTRAVENOUS
  Filled 2019-01-23 (×3): qty 6
  Filled 2019-01-23: qty 8

## 2019-01-23 NOTE — ED Notes (Signed)
RN called pharmacy to have orders verified and timed correctly.

## 2019-01-23 NOTE — H&P (Signed)
History and Physical    Kara Hanson CMK:[id number] DOB: [birth year] DOA: 01/23/2019  PCP: [name], MD Patient coming from: Home lives at home with her daughter  Chief Complaint: Shortness of breath  HPI: Kara Hanson is a 63 y.o. female with medical history significant of congestive heart failure with an ejection fraction of 50% echo 11/01/2018 has been having increasing shortness of breath and dyspnea on exertion sleeping on 3 pillows at home.  Patient does not check her weight at home but she has increasing lower extremity edema up to her knees.  She complains of chest tightness at baseline she is on 4 L of oxygen at home 24/7.  She is also on Lasix 40 mg daily at home.  Her PCP has increased the dose of Lasix to 60 mg daily at home recently.  Still she has not had any improvement in her symptoms therefore she decided to come to the ER.  She denies any sick contacts.  She denies any fever chills or cough.  She denies any nausea vomiting diarrhea urinary complaints.  Her last hospital admission was 2 years ago for the same.  She denies any dietary or medical noncompliance.  ED Course: Sodium 137 potassium 4.4 BUN 37 creatinine 1.74 which is up from 1.56 earlier this month.  White count 4.7 hemoglobin 9.5 platelet count 207.  Chest x-ray shows increasing bilateral lung opacity favored due to acute pulmonary edema with lung base atelectasis and possible small pleural effusion   Review of Systems: As per HPI otherwise all other systems reviewed and are negative  Ambulatory Status: Ambulatory at baseline  Past Medical History:  Diagnosis Date   CHF (congestive heart failure) (HCC)    CVA (cerebral vascular accident) ([place])    Diabetes mellitus without complication ([place])    Hypercholesteremia    Hypertension    Myocardial infarction ([place])    Pneumonia 11/01/2018   Spinal stenosis     Past Surgical History:  Procedure Laterality Date   BLADDER SURGERY     CARDIAC  CATHETERIZATION N/A 04/25/2016   Procedure: Left Heart Cath and Coronary Angiography;  Surgeon: Lorretta Harp, MD;  Location: [facility];  Service: Cardiovascular;  Laterality: N/A;   CESAREAN SECTION     CHOLECYSTECTOMY      Social History   Socioeconomic History   Marital status: Widowed    Spouse name: Not on file   Number of children: Not on file   Years of education: Not on file   Highest education level: Not on file  Occupational History   Not on file  Social Needs   Financial resource strain: Not on file   Food insecurity    Worry: Not on file    Inability: Not on file   Transportation needs    Medical: Not on file    Non-medical: Not on file  Tobacco Use   Smoking status: Never Smoker   Smokeless tobacco: Never Used  Substance and Sexual Activity   Alcohol use: No   Drug use: No   Sexual activity: Not Currently    Birth control/protection: None  Lifestyle   Physical activity    Days per week: Not on file    Minutes per session: Not on file   Stress: Not on file  Relationships   Social connections    Talks on phone: Not on file    Gets together: Not on file    Attends religious service: Not on file    Active  member of club or organization: Not on file    Attends meetings of clubs or organizations: Not on file    Relationship status: Not on file   Intimate partner violence    Fear of current or ex partner: Not on file    Emotionally abused: Not on file    Physically abused: Not on file    Forced sexual activity: Not on file  Other Topics Concern   Not on file  Social History Narrative   Not on file    Allergies  Allergen Reactions   Garlic Shortness Of Breath, Itching and Swelling    Hand itching and swelling   Latex Itching   Morphine And Related Itching and Other (See Comments)    Headache    Other Itching    Reaction to newspaper ink -itching and headache    Family History  Problem Relation Age of Onset     Diabetes Mellitus II Father    Stroke Father    Healthy Mother        She is 70 years old.       Prior to Admission medications   Medication Sig Start Date End Date Taking? Authorizing Provider  allopurinol (ZYLOPRIM) 300 MG tablet Take 1 tablet (300 mg total) by mouth daily. 01/08/19  Yes [name], MD  amLODipine (NORVASC) 5 MG tablet Take 1 tablet (5 mg total) by mouth at bedtime. 01/08/19 02/07/19 Yes [name], MD  aspirin EC 81 MG tablet Take 81 mg by mouth daily.   Yes [provider]  atorvastatin (LIPITOR) 80 MG tablet Take 1 tablet (80 mg total) by mouth daily at 6 PM. 01/08/19 02/07/19 Yes Newlin, Charlane Ferretti, MD  carvedilol (COREG) 12.5 MG tablet Take 1 tablet (12.5 mg total) by mouth 2 (two) times daily with a meal. 01/08/19  Yes Newlin, Enobong, MD  DULoxetine (CYMBALTA) 60 MG capsule Take 1 capsule (60 mg total) by mouth daily. For chronic back and neck pain Patient taking differently: Take 60 mg by mouth daily as needed. For chronic back and neck pain 01/08/19  Yes Newlin, Charlane Ferretti, MD  furosemide (LASIX) 40 MG tablet Take 1 tablet (40 mg total) by mouth daily. Patient taking differently: Take 40 mg by mouth 2 (two) times daily.  01/08/19  Yes [name], MD  gabapentin (NEURONTIN) 300 MG capsule Take 1 capsule (300 mg total) by mouth 2 (two) times daily. 01/08/19 02/07/19 Yes [name], MD  insulin aspart (NOVOLOG) 100 UNIT/ML injection 0 to 12 units subcutaneously 3 times daily before meals as per sliding scale 03/29/18  Yes Newlin, Enobong, MD  Insulin Glargine (BASAGLAR KWIKPEN) 100 UNIT/ML SOPN Inject 0.05 mLs (5 Units total) into the skin at bedtime. Patient taking differently: Inject 35 Units into the skin at bedtime.  10/02/18  Yes Bonnielee Haff, MD  lansoprazole (PREVACID) 15 MG capsule Take 15 mg by mouth daily.  10/02/18  Yes [provider]  omeprazole (PRILOSEC) 20 MG capsule Take 20 mg by mouth daily.   Yes [provider]   polyethylene glycol (MIRALAX / GLYCOLAX) 17 g packet Take 17 g by mouth daily. Patient taking differently: Take 17 g by mouth daily as needed for moderate constipation.  10/02/18  Yes Bonnielee Haff, MD  SUPER B COMPLEX/C CAPS Take 1 capsule by mouth daily.   Yes [provider]  Accu-Chek FastClix Lancets MISC USE AS DIRECTED TO TEST BLOOD SUGAR THREE TIMES DAILY 07/31/18   [name], MD  Blood Glucose Monitoring  Suppl (ACCU-CHEK GUIDE) w/Device KIT 1 each by Does not apply route 3 (three) times daily. 07/31/18   [name], MD  EASY COMFORT PEN NEEDLES 31G X 5 MM MISC USE FOUR TIMES PER DAY FOR INSULIN ADMINISTRATION Patient taking differently: 4 (four) times daily.  10/02/18   [name], MD  ergocalciferol (DRISDOL) 1.25 MG (50000 UT) capsule Take 1 capsule (50,000 Units total) by mouth once a week. 09/18/18   [name], MD  glucose blood (ACCU-CHEK GUIDE) test strip USE AS DIRECTED TO TEST BLOOD SUGAR THREE TIMES DAILY 07/31/18   [name], MD  guaiFENesin-dextromethorphan (ROBITUSSIN DM) 100-10 MG/5ML syrup Take 10 mLs by mouth every 6 (six) hours. Patient not taking: Reported on 01/08/2019 11/05/18   Deatra James, MD  lactobacillus acidophilus & bulgar (LACTINEX) chewable tablet Chew 1 tablet by mouth 3 (three) times daily with meals. Patient not taking: Reported on 01/23/2019 11/05/18   Deatra James, MD  Lancet Device MISC Use as instructed 3 times daily 09/19/17   Rodell Perna A, PA-C  methylPREDNISolone (MEDROL) 4 MG tablet Take 1 tablet (4 mg total) by mouth daily. Take medication as directed per dose pack Patient not taking: Reported on 01/08/2019 11/05/18   Deatra James, MD  Misc. Devices MISC Portable oxygen concentrator.  Diagnosis-chronic respiratory failure. 09/03/18   [name], MD  Misc. Devices MISC Rollaor with seat. Dx: Congestive Heart Failure 10/19/18   [name], MD  ondansetron (ZOFRAN) 4 MG tablet Take 1 tablet (4 mg  total) by mouth every 6 (six) hours as needed for nausea. 07/25/18   Charlynne Cousins, MD    Physical Exam: Vitals:   01/23/19 1456 01/23/19 1500 01/23/19 1530 01/23/19 1600  BP:  (!) 172/83 (!) 158/87 (!) 163/86  Pulse:  83 79 77  Resp:  (!) 26 (!) 29 20  Temp:      TempSrc:      SpO2:  99% 100% 98%  Weight: (S) 73 kg     Height: (S) 5' (1.524 m)         General:  Appears in mild distress due to difficulty breathing  Eyes:  PERRL, EOMI, normal lids, iris  ENT: grossly normal hearing, lips & tongue, mmm  Neck:  no LAD, masses or thyromegaly  Cardiovascular:  RRR, no m/r/g. No LE edema.   Respiratory: Crackles bilaterally, no w/r/r. Normal respiratory effort.  Abdomen:  soft, ntnd, NABS  Skin:  no rash or induration seen on limited exam  Musculoskeletal: 3+ pitting edema up to the knees psychiatric:  grossly normal mood and affect, speech fluent and appropriate, AOx3  Neurologic:  CN 2-12 grossly intact, moves all extremities in coordinated fashion, sensation intact  Labs on Admission: I have personally reviewed following labs and imaging studies  CBC: Recent Labs  Lab 01/23/19 1343  WBC 4.7  NEUTROABS 3.7  HGB 9.5*  HCT 31.8*  MCV 91.4  PLT 884   Basic Metabolic Panel: Recent Labs  Lab 01/23/19 1343  NA 137  K 4.4  CL 98  CO2 29  GLUCOSE 307*  BUN 37*  CREATININE 1.74*  CALCIUM 9.1   GFR: Estimated Creatinine Clearance: 29.1 mL/min (A) (by C-G formula based on SCr of 1.74 mg/dL (H)). Liver Function Tests: Recent Labs  Lab 01/23/19 1343  AST 14*  ALT 11  ALKPHOS 65  BILITOT 0.3  PROT 6.0*  ALBUMIN 2.8*   No results for input(s): LIPASE, AMYLASE in the last 168 hours. No results  for input(s): AMMONIA in the last 168 hours. Coagulation Profile: No results for input(s): INR, PROTIME in the last 168 hours. Cardiac Enzymes: No results for input(s): CKTOTAL, CKMB, CKMBINDEX, TROPONINI in the last 168 hours. BNP (last 3 results) No  results for input(s): PROBNP in the last 8760 hours. HbA1C: No results for input(s): HGBA1C in the last 72 hours. CBG: No results for input(s): GLUCAP in the last 168 hours. Lipid Profile: No results for input(s): CHOL, HDL, LDLCALC, TRIG, CHOLHDL, LDLDIRECT in the last 72 hours. Thyroid Function Tests: No results for input(s): TSH, T4TOTAL, FREET4, T3FREE, THYROIDAB in the last 72 hours. Anemia Panel: No results for input(s): VITAMINB12, FOLATE, FERRITIN, TIBC, IRON, RETICCTPCT in the last 72 hours. Urine analysis:    Component Value Date/Time   COLORURINE YELLOW 01/02/2018 1452   APPEARANCEUR HAZY (A) 01/02/2018 1452   LABSPEC 1.039 (H) 01/02/2018 1452   PHURINE 6.0 01/02/2018 1452   GLUCOSEU >=500 (A) 01/02/2018 1452   HGBUR NEGATIVE 01/02/2018 1452   BILIRUBINUR NEGATIVE 01/02/2018 1452   BILIRUBINUR negative 12/13/2017 1514   KETONESUR NEGATIVE 01/02/2018 1452   PROTEINUR >=300 (A) 01/02/2018 1452   UROBILINOGEN 0.2 12/13/2017 1514   NITRITE NEGATIVE 01/02/2018 1452   LEUKOCYTESUR NEGATIVE 01/02/2018 1452    Creatinine Clearance: Estimated Creatinine Clearance: 29.1 mL/min (A) (by C-G formula based on SCr of 1.74 mg/dL (H)).  Sepsis Labs: '@LABRCNTIP'$ (procalcitonin:4,lacticidven:4) )No results found for this or any previous visit (from the past 240 hour(s)).   Radiological Exams on Admission: Dg Chest Port 1 View  Result Date: 01/23/2019 CLINICAL DATA:  63 year old female with shortness of breath. On home oxygen. EXAM: PORTABLE CHEST 1 VIEW COMPARISON:  10/31/2018 and earlier. FINDINGS: Portable AP semi upright view at 1509 hours. Stable lung volumes and mediastinal contours. Visualized tracheal air column is within normal limits. Further increased bilateral interstitial and lung base opacity, with obscuration of the diaphragm now. No superimposed pneumothorax. No definite air bronchograms. Paucity bowel gas in the upper abdomen. No acute osseous abnormality identified.  IMPRESSION: Increasing bilateral lung opacity favored due to acute pulmonary edema with lung base atelectasis and possibly small pleural effusions. Bilateral lower lobe pneumonia felt less likely. Electronically Signed   By: Genevie Ann M.D.   On: 01/23/2019 15:18    EKG: Independently reviewed.  T wave changes in the lateral leads.  Assessment/Plan Active Problems:   * No active hospital problems. *    #1 acute on chronic diastolic CHF exacerbation-patient admitted with increasing lower extremity edema shortness of breath dyspnea on exertion and orthopnea.  With these new changes I will order an echo though it was done in 10/22/2018.  Check EKG in a.m. check troponin.  EKG today shows T inversions in the lateral leads.  This is not new compared to the prior EKG.  I will treat her with Lasix twice a day.  I's and O's Daily weights.  BNP is 423.4.   #2 AKI creatinine is elevated compared to her baseline of 1.4-1.5 today her creatinine is 1.74.  With further diuresis watch creatinine closely.  #3 type 2 diabetes last hemoglobin A1c 7.8 earlier this month.  She is on Lantus 35 units at bedtime and aspart insulin 3 times a day prior to meals.  #4 hypertension on Norvasc, Coreg, Lasix at home I will continue Coreg and Lasix increase Lasix dose 60 mg IV twice daily.  #5 hyperlipidemia continue Lipitor  #6 type 2 diabetes complicated with neuropathy continue Neurontin and Cymbalta.  #7 gout  restart allopurinol at a lower dose.      Estimated body mass index is 31.43 kg/m as calculated from the following:   Height as of this encounter: 5' (1.524 m).   Weight as of this encounter: 73 kg.   DVT prophylaxis: Lovenox Code Status: Full code Family Communication: None at bedside Disposition Plan: Pending clinical improvement Consults called: None Admission status: Inpatient Severity of Illness: The appropriate patient status for this patient is INPATIENT. Inpatient status is judged to be  reasonable and necessary in order to provide the required intensity of service to ensure the patient's safety. The patient's presenting symptoms, physical exam findings, and initial radiographic and laboratory data in the context of their chronic comorbidities is felt to place them at high risk for further clinical deterioration. Furthermore, it is not anticipated that the patient will be medically stable for discharge from the hospital within 2 midnights of admission. The following factors support the patient status of inpatient.   " The patient's presenting symptoms include shortness of breath dyspnea on exertion lower extremity edema . " The worrisome physical exam findings include hypoxemia bilateral lower extremity edema and weight gain " The initial radiographic and laboratory data are worrisome because of pulmonary edema " The chronic co-morbidities include type 2 diabetes, neuropathy, CHF.   * I certify that at the point of admission it is my clinical judgment that the patient will require inpatient hospital care spanning beyond 2 midnights from the point of admission due to high intensity of service, high risk for further deterioration and high frequency of surveillance required.Georgette Shell MD [facility]  If 7PM-7AM, please contact night-coverage [email or web address] Password [facility]  01/23/2019, 5:54 PM

## 2019-01-23 NOTE — ED Provider Notes (Signed)
[facility] DEPT Provider Note   CSN: [id number] Arrival date & time: 01/23/19  1306     History   Chief Complaint Chief Complaint  Patient presents with  . Shortness of Breath  . Congestive Heart Failure    HPI Kara Hanson is a 63 y.o. female with a hx of CHF last EF 50%, chronic respiratory failure on 4L O2 via [place], CAD, prior CVA, T2DM, HTN, anemia, & CKD who presents to the ED w/ complaints of progressively worsening dyspnea for the past 1 week.  Patient states that Kara Hanson feels constantly short of breath, this is worse when Kara Hanson is exerting herself or laying in the supine position, Kara Hanson is having to sleep sitting upright as opposed to on her side as Kara Hanson typically would.  Kara Hanson has had associated increased lower extremity swelling bilaterally.  Kara Hanson states that Kara Hanson feels very fluid overloaded.  Kara Hanson had brief chest discomfort yesterday but the did not feel similar to prior MI, no chest pain at present.  Kara Hanson has been taking her Lasix, Kara Hanson is currently prescribed 40 mg daily, Kara Hanson states that Kara Hanson has been taking this twice per day some days over the past week secondary to her fluid overload, Kara Hanson states Kara Hanson is not having great urine output with this.  Denies fever, chills, cough, nausea, vomiting, or diaphoresis.  Kara Hanson states this feels similar to previously when Kara Hanson has had to be admitted for a CHF exacerbation.    HPI  Past Medical History:  Diagnosis Date  . CHF (congestive heart failure) ([place])   . CVA (cerebral vascular accident) ([place])   . Diabetes mellitus without complication ([place])   . Hypercholesteremia   . Hypertension   . Myocardial infarction ([place])   . Pneumonia 11/01/2018  . Spinal stenosis     Patient Active Problem List   Diagnosis Date Noted  . CAP (community acquired pneumonia) 10/31/2018  . Acute CHF (congestive heart failure) ([place]) 09/24/2018  . SIRS (systemic inflammatory response syndrome) ([place]) 07/25/2018  . COVID-19 virus infection  07/20/2018  . Chronic diastolic CHF (congestive heart failure) ([place]) 07/20/2018  . Pneumonia due to COVID-19 virus 07/20/2018  . Vitamin D deficiency 04/02/2018  . Spinal stenosis 03/29/2018  . Acute and chronic respiratory failure with hypoxia ([place]) 03/20/2018  . Diabetes mellitus type 2 with complications, uncontrolled ([place]) 03/20/2018  . CKD (chronic kidney disease), stage III 03/20/2018  . Acute on chronic diastolic CHF (congestive heart failure) ([place]) 11/27/2017  . Acute kidney injury ([place]) 11/27/2017  . Acute respiratory distress 11/27/2017  . Bulging lumbar disc 11/14/2017  . Gout 11/14/2017  . HLD (hyperlipidemia) 09/30/2016  . Chest pain 06/17/2016  . Stress-induced cardiomyopathy 05/19/2016  . Type 2 diabetes mellitus with diabetic neuropathy, unspecified ([place]) 05/06/2016  . Vertigo 05/06/2016  . Controlled type 2 diabetes mellitus with hyperglycemia ([place]) 04/23/2016  . Hypertension 04/23/2016  . Normochromic normocytic anemia 04/23/2016  . Syncope 04/23/2016    Past Surgical History:  Procedure Laterality Date  . BLADDER SURGERY    . CARDIAC CATHETERIZATION N/A 04/25/2016   Procedure: Left Heart Cath and Coronary Angiography;  Surgeon: Lorretta Harp, MD;  Location: [facility];  Service: Cardiovascular;  Laterality: N/A;  . CESAREAN SECTION    . CHOLECYSTECTOMY       OB History   No obstetric history on file.      Home Medications    Prior to Admission medications   Medication Sig Start Date End Date Taking? Authorizing  Provider  Accu-Chek FastClix Lancets MISC USE AS DIRECTED TO TEST BLOOD SUGAR THREE TIMES DAILY 07/31/18   [name], MD  allopurinol (ZYLOPRIM) 300 MG tablet Take 1 tablet (300 mg total) by mouth daily. 01/08/19   [name], MD  amLODipine (NORVASC) 5 MG tablet Take 1 tablet (5 mg total) by mouth at bedtime. 01/08/19 02/07/19  [name], MD  aspirin EC 81 MG tablet Take 81 mg by mouth daily.    [provider]   atorvastatin (LIPITOR) 80 MG tablet Take 1 tablet (80 mg total) by mouth daily at 6 PM. 01/08/19 02/07/19  [name], MD  Blood Glucose Monitoring Suppl (ACCU-CHEK GUIDE) w/Device KIT 1 each by Does not apply route 3 (three) times daily. 07/31/18   [name], MD  carvedilol (COREG) 12.5 MG tablet Take 1 tablet (12.5 mg total) by mouth 2 (two) times daily with a meal. 01/08/19   [name], MD  DULoxetine (CYMBALTA) 60 MG capsule Take 1 capsule (60 mg total) by mouth daily. For chronic back and neck pain 01/08/19   [name], MD  EASY COMFORT PEN NEEDLES 31G X 5 MM MISC USE FOUR TIMES PER DAY FOR INSULIN ADMINISTRATION Patient taking differently: 4 (four) times daily.  10/02/18   [name], MD  ergocalciferol (DRISDOL) 1.25 MG (50000 UT) capsule Take 1 capsule (50,000 Units total) by mouth once a week. 09/18/18   [name], MD  furosemide (LASIX) 40 MG tablet Take 1 tablet (40 mg total) by mouth daily. 01/08/19   [name], MD  gabapentin (NEURONTIN) 300 MG capsule Take 1 capsule (300 mg total) by mouth 2 (two) times daily. 01/08/19 02/07/19  [name], MD  glucose blood (ACCU-CHEK GUIDE) test strip USE AS DIRECTED TO TEST BLOOD SUGAR THREE TIMES DAILY 07/31/18   [name], MD  guaiFENesin-dextromethorphan (ROBITUSSIN DM) 100-10 MG/5ML syrup Take 10 mLs by mouth every 6 (six) hours. Patient not taking: Reported on 01/08/2019 11/05/18   Deatra James, MD  insulin aspart (NOVOLOG) 100 UNIT/ML injection 0 to 12 units subcutaneously 3 times daily before meals as per sliding scale 03/29/18   [name], MD  Insulin Glargine (BASAGLAR KWIKPEN) 100 UNIT/ML SOPN Inject 0.05 mLs (5 Units total) into the skin at bedtime. 10/02/18   Bonnielee Haff, MD  lactobacillus acidophilus & bulgar (LACTINEX) chewable tablet Chew 1 tablet by mouth 3 (three) times daily with meals. 11/05/18   Deatra James, MD  Lancet Device MISC Use as instructed 3 times daily 09/19/17    Rodell Perna A, PA-C  methylPREDNISolone (MEDROL) 4 MG tablet Take 1 tablet (4 mg total) by mouth daily. Take medication as directed per dose pack Patient not taking: Reported on 01/08/2019 11/05/18   Deatra James, MD  Misc. Devices MISC Portable oxygen concentrator.  Diagnosis-chronic respiratory failure. 09/03/18   [name], MD  Misc. Devices MISC [place] with seat. Dx: Congestive Heart Failure 10/19/18   [name], MD  omeprazole (PRILOSEC) 20 MG capsule Take 20 mg by mouth daily.    [provider]  ondansetron (ZOFRAN) 4 MG tablet Take 1 tablet (4 mg total) by mouth every 6 (six) hours as needed for nausea. 07/25/18   Charlynne Cousins, MD  polyethylene glycol (MIRALAX / GLYCOLAX) 17 g packet Take 17 g by mouth daily. 10/02/18   Bonnielee Haff, MD  SUPER B COMPLEX/C CAPS Take 1 capsule by mouth daily.    [provider]    Family History Family History  Problem Relation Age of Onset  . Diabetes Mellitus II Father   . Stroke Father   . Healthy Mother        Kara Hanson is 63 years old.     Social History Social History   Tobacco Use  . Smoking status: Never Smoker  . Smokeless tobacco: Never Used  Substance Use Topics  . Alcohol use: No  . Drug use: No     Allergies   Garlic, Latex, Morphine and related, and Other   Review of Systems Review of Systems  Constitutional: Negative for chills, diaphoresis and fever.  Respiratory: Positive for shortness of breath. Negative for cough.   Cardiovascular: Positive for chest pain (not @ present) and leg swelling.  Gastrointestinal: Negative for abdominal pain, nausea and vomiting.  Neurological: Negative for dizziness, syncope, weakness and numbness.  All other systems reviewed and are negative.   Physical Exam Updated Vital Signs BP (!) 160/73 (BP Location: Left Arm)   Pulse 81   Temp 98.1 F (36.7 C) (Oral)   Resp (!) 26   Ht 5' (1.524 m)   Wt 73 kg   SpO2 99%   BMI 31.43 kg/m   Physical  Exam Vitals signs and nursing note reviewed.  Constitutional:      Appearance: Kara Hanson is well-developed. Kara Hanson is not toxic-appearing.  HENT:     Head: Normocephalic and atraumatic.  Eyes:     General:        Right eye: No discharge.        Left eye: No discharge.     Conjunctiva/sclera: Conjunctivae normal.  Neck:     Musculoskeletal: Neck supple.  Cardiovascular:     Rate and Rhythm: Normal rate and regular rhythm.  Pulmonary:     Effort: Tachypnea present. No respiratory distress.     Breath sounds: Decreased breath sounds (bibasilar) and rales (bibasilar) present. No wheezing or rhonchi.     Comments: SpO2 95-97% on 4L via [place].  Abdominal:     General: There is no distension.     Palpations: Abdomen is soft.     Tenderness: There is no abdominal tenderness.  Musculoskeletal:     Comments: 2+ symmetric pitting edema to the lower legs without overlying erythema/warmth or tenderness to palpation.   Skin:    General: Skin is warm and dry.     Findings: No rash.  Neurological:     Mental Status: Kara Hanson is alert.     Comments: Clear speech.   Psychiatric:        Behavior: Behavior normal.    ED Treatments / Results  Labs (all labs ordered are listed, but only abnormal results are displayed) Labs Reviewed  CBC WITH DIFFERENTIAL/PLATELET - Abnormal; Notable for the following components:      Result Value   RBC 3.48 (*)    Hemoglobin 9.5 (*)    HCT 31.8 (*)    MCHC 29.9 (*)    Lymphs Abs 0.5 (*)    All other components within normal limits  COMPREHENSIVE METABOLIC PANEL - Abnormal; Notable for the following components:   Glucose, Bld 307 (*)    BUN 37 (*)    Creatinine, Ser 1.74 (*)    Total Protein 6.0 (*)    Albumin 2.8 (*)    AST 14 (*)    GFR calc non Af Amer 30 (*)    GFR calc Af Amer 35 (*)    All other components within normal limits  BRAIN NATRIURETIC PEPTIDE - Abnormal;  Notable for the following components:   B Natriuretic Peptide 423.4 (*)    All other  components within normal limits  TROPONIN I (HIGH SENSITIVITY)    EKG EKG Interpretation  Date/Time:  Wednesday January 23 2019 13:36:47 EDT Ventricular Rate:  79 PR Interval:    QRS Duration: 92 QT Interval:  395 QTC Calculation: 453 R Axis:   75 Text Interpretation:  Sinus rhythm Borderline low voltage, extremity leads Repol abnrm suggests ischemia, anterolateral No significant change since last tracing Confirmed by Dorie Rank [phone]) on 01/23/2019 2:21:30 PM   Radiology Dg Chest Port 1 View  Result Date: 01/23/2019 CLINICAL DATA:  64 year old female with shortness of breath. On home oxygen. EXAM: PORTABLE CHEST 1 VIEW COMPARISON:  10/31/2018 and earlier. FINDINGS: Portable AP semi upright view at 1509 hours. Stable lung volumes and mediastinal contours. Visualized tracheal air column is within normal limits. Further increased bilateral interstitial and lung base opacity, with obscuration of the diaphragm now. No superimposed pneumothorax. No definite air bronchograms. Paucity bowel gas in the upper abdomen. No acute osseous abnormality identified. IMPRESSION: Increasing bilateral lung opacity favored due to acute pulmonary edema with lung base atelectasis and possibly small pleural effusions. Bilateral lower lobe pneumonia felt less likely. Electronically Signed   By: Genevie Ann M.D.   On: 01/23/2019 15:18   Procedures Procedures (including critical care time)  Medications Ordered in ED Medications  furosemide (LASIX) injection 40 mg (40 mg Intravenous Given 01/23/19 1513)  potassium chloride SA (KLOR-CON) CR tablet 20 mEq (20 mEq Oral Given 01/23/19 1513)    Initial Impression / Assessment and Plan / ED Course  I have reviewed the triage vital signs and the nursing notes.  Pertinent labs & imaging results that were available during my care of the patient were reviewed by me and considered in my medical decision making (see chart for details).   Patient with history of CHF last  EF 50% presents to the emergency department with progressively worsening dyspnea and lower extremity swelling for the past 1 week.  Patient is nontoxic-appearing, Kara Hanson is somewhat tachypneic, BP somewhat elevated, vitals otherwise WNL.  Kara Hanson has decreased breath sounds with rales @ the bases with 2+ symmetric pitting edema.  Primary concern is for CHF exacerbation, additional DDX: Atypical ACS, PE, bacterial pneumonia, Covid, pneumothorax.  CBC: Anemia similar to prior CMP: Creatinine mildly elevated compared to prior 1.74 today previously 1.56. Troponin: 10, similar to prior BNP: Elevated at 423.4, similar to prior CHF exacerbation values EKG: No significant change from prior. Chest x-ray: Increasing bilateral lung opacity favored due to acute pulmonary edema with lung base atelectasis and possibly small pleural effusions. Bilateral lower lobe pneumonia felt less likely. --> suspect fluid overload, no cough/fevers.   Concern for CHF exacerbation, has been increasing her Lasix at home without improvement in setting of mildly worsened renal function feel patient requires admission, Kara Hanson is in agreement. IV lasix ordered. Consult placed to hospitalist service.     15:53: CONSULT: Discussed with hospitalist Dr. Zigmund Daniel- accepts admission.   Findings and plan of care discussed with supervising physician Dr. Tomi Bamberger who is in agreement.   Kara Hanson was evaluated in Emergency Department on 01/23/2019 for the symptoms described in the history of present illness. He/Kara Hanson was evaluated in the context of the global COVID-19 pandemic, which necessitated consideration that the patient might be at risk for infection with the SARS-CoV-2 virus that causes COVID-19. Institutional protocols and algorithms that pertain to the evaluation of patients at  risk for COVID-19 are in a state of rapid change based on information released by regulatory bodies including the CDC and federal and state organizations. These policies  and algorithms were followed during the patient's care in the ED.   Final Clinical Impressions(s) / ED Diagnoses   Final diagnoses:  Acute on chronic congestive heart failure, unspecified heart failure type [facility])    ED Discharge Orders    None       Amaryllis Dyke, PA-C 01/23/19 1555    Dorie Rank, MD 01/24/19 [phone]

## 2019-01-23 NOTE — ED Triage Notes (Signed)
Patient is arrived via [facility] from home. Patient is AOx4 and ambulatory. Patient chief complaint is SOB. Patient is on 4L nasal canula at home and currently in ED. Patient stated that SOB began earlier today. Lung sounds clear and equal. Hx of CHF and Diabetes.

## 2019-01-23 NOTE — ED Notes (Signed)
ED TO INPATIENT HANDOFF REPORT  ED Nurse Name and Phone #: [place] [facility], [phone]  S Name/Age/Gender Kara Hanson 63 y.o. female Room/Bed: WA17/WA17  Code Status   Code Status: Full Code  Home/SNF/Other Home Patient oriented to: self, place, time and situation Is this baseline? Yes   Triage Complete: Triage complete  Chief Complaint shob  Triage Note Patient is arrived via [facility] from home. Patient is AOx4 and ambulatory. Patient chief complaint is SOB. Patient is on 4L nasal canula at home and currently in ED. Patient stated that SOB began earlier today. Lung sounds clear and equal. Hx of CHF and Diabetes.     Allergies Allergies  Allergen Reactions  . Garlic Shortness Of Breath, Itching and Swelling    Hand itching and swelling  . Latex Itching  . Morphine And Related Itching and Other (See Comments)    Headache   . Other Itching    Reaction to newspaper ink -itching and headache    Level of Care/Admitting Diagnosis ED Disposition    ED Disposition Condition [place]: [facility] [[id number]]  Level of Care: Telemetry [5]  Admit to tele based on following criteria: Acute CHF  Covid Evaluation: Asymptomatic Screening Protocol (No Symptoms)  Diagnosis: CHF (congestive heart failure) [facility]) [[id number]]  Admitting Physician: Georgette Shell [[id number]]  Attending Physician: Georgette Shell [[id number]]  Estimated length of stay: 3 - 4 days  Certification:: I certify this patient will need inpatient services for at least 2 midnights  PT Class (Do Not Modify): Inpatient [101]  PT Acc Code (Do Not Modify): Private [1]       B Medical/Surgery History Past Medical History:  Diagnosis Date  . CHF (congestive heart failure) ([place])   . CVA (cerebral vascular accident) ([place])   . Diabetes mellitus without complication ([place])   . Hypercholesteremia   . Hypertension   . Myocardial infarction ([place])   . Pneumonia 11/01/2018  .  Spinal stenosis    Past Surgical History:  Procedure Laterality Date  . BLADDER SURGERY    . CARDIAC CATHETERIZATION N/A 04/25/2016   Procedure: Left Heart Cath and Coronary Angiography;  Surgeon: Lorretta Harp, MD;  Location: [facility];  Service: Cardiovascular;  Laterality: N/A;  . CESAREAN SECTION    . CHOLECYSTECTOMY       A IV Location/Drains/Wounds Patient Lines/Drains/Airways Status   Active Line/Drains/Airways    Name:   Placement date:   Placement time:   Site:   Days:   Peripheral IV 01/23/19 Right Antecubital   01/23/19    1312    Antecubital   less than 1          Intake/Output Last 24 hours No intake or output data in the 24 hours ending 01/23/19 1958  Labs/Imaging Results for orders placed or performed during the hospital encounter of 01/23/19 (from the past 48 hour(s))  CBC with Differential     Status: Abnormal   Collection Time: 01/23/19  1:43 PM  Result Value Ref Range   WBC 4.7 4.0 - 10.5 K/uL   RBC 3.48 (L) 3.87 - 5.11 MIL/uL   Hemoglobin 9.5 (L) 12.0 - 15.0 g/dL   HCT 31.8 (L) 36.0 - 46.0 %   MCV 91.4 80.0 - 100.0 fL   MCH 27.3 26.0 - 34.0 pg   MCHC 29.9 (L) 30.0 - 36.0 g/dL   RDW 14.9 11.5 - 15.5 %   Platelets 207 150 - 400 K/uL  nRBC 0.0 0.0 - 0.2 %   Neutrophils Relative % 80 %   Neutro Abs 3.7 1.7 - 7.7 K/uL   Lymphocytes Relative 10 %   Lymphs Abs 0.5 (L) 0.7 - 4.0 K/uL   Monocytes Relative 8 %   Monocytes Absolute 0.4 0.1 - 1.0 K/uL   Eosinophils Relative 2 %   Eosinophils Absolute 0.1 0.0 - 0.5 K/uL   Basophils Relative 0 %   Basophils Absolute 0.0 0.0 - 0.1 K/uL   Immature Granulocytes 0 %   Abs Immature Granulocytes 0.01 0.00 - 0.07 K/uL    Comment: Performed at [facility], [place] [address] [place], [place] [address]  Comprehensive metabolic panel     Status: Abnormal   Collection Time: 01/23/19  1:43 PM  Result Value Ref Range   Sodium 137 135 - 145 mmol/L   Potassium 4.4 3.5 - 5.1 mmol/L    Chloride 98 98 - 111 mmol/L   CO2 29 22 - 32 mmol/L   Glucose, Bld 307 (H) 70 - 99 mg/dL   BUN 37 (H) 8 - 23 mg/dL   Creatinine, Ser 1.74 (H) 0.44 - 1.00 mg/dL   Calcium 9.1 8.9 - 10.3 mg/dL   Total Protein 6.0 (L) 6.5 - 8.1 g/dL   Albumin 2.8 (L) 3.5 - 5.0 g/dL   AST 14 (L) 15 - 41 U/L   ALT 11 0 - 44 U/L   Alkaline Phosphatase 65 38 - 126 U/L   Total Bilirubin 0.3 0.3 - 1.2 mg/dL   GFR calc non Af Amer 30 (L) >60 mL/min   GFR calc Af Amer 35 (L) >60 mL/min   Anion gap 10 5 - 15    Comment: Performed at [facility], [place] [address] [place], [place] [address]  Troponin I (High Sensitivity)     Status: None   Collection Time: 01/23/19  1:43 PM  Result Value Ref Range   Troponin I (High Sensitivity) 10 <18 ng/L    Comment: (NOTE) Elevated high sensitivity troponin I (hsTnI) values and significant  changes across serial measurements may suggest ACS but many other  chronic and acute conditions are known to elevate hsTnI results.  Refer to the "Links" section for chest pain algorithms and additional  guidance. Performed at [facility], [place] [address] [place], [place] [address]   Brain natriuretic peptide     Status: Abnormal   Collection Time: 01/23/19  1:43 PM  Result Value Ref Range   B Natriuretic Peptide 423.4 (H) 0.0 - 100.0 pg/mL    Comment: Performed at [facility], [place] [address] [place], [place] [address]  Troponin I (High Sensitivity)     Status: None   Collection Time: 01/23/19  3:55 PM  Result Value Ref Range   Troponin I (High Sensitivity) 11 <18 ng/L    Comment: (NOTE) Elevated high sensitivity troponin I (hsTnI) values and significant  changes across serial measurements may suggest ACS but many other  chronic and acute conditions are known to elevate hsTnI results.  Refer to the "Links" section for chest pain algorithms and additional  guidance. Performed at [facility], [place]  [address], [place], [place] [address]    Dg Chest Port 1 View  Result Date: 01/23/2019 CLINICAL DATA:  63 year old female with shortness of breath. On home oxygen. EXAM: PORTABLE CHEST 1 VIEW COMPARISON:  10/31/2018 and earlier. FINDINGS: Portable AP semi upright view at 1509 hours. Stable lung volumes  and mediastinal contours. Visualized tracheal air column is within normal limits. Further increased bilateral interstitial and lung base opacity, with obscuration of the diaphragm now. No superimposed pneumothorax. No definite air bronchograms. Paucity bowel gas in the upper abdomen. No acute osseous abnormality identified. IMPRESSION: Increasing bilateral lung opacity favored due to acute pulmonary edema with lung base atelectasis and possibly small pleural effusions. Bilateral lower lobe pneumonia felt less likely. Electronically Signed   By: Genevie Ann M.D.   On: 01/23/2019 15:18    Pending Labs Unresulted Labs (From admission, onward)    Start     Ordered   01/24/19 0500  CBC  Tomorrow morning,   R     01/23/19 1817   01/24/19 0500  Comprehensive metabolic panel  Tomorrow morning,   R     01/23/19 1817   01/23/19 1522  SARS CORONAVIRUS 2 (TAT 6-24 HRS) Nasopharyngeal Nasopharyngeal Swab  (Asymptomatic/Tier 2 Patients Labs)  Once,   STAT    Question Answer Comment  Is this test for diagnosis or screening Screening   Symptomatic for COVID-19 as defined by CDC No   Hospitalized for COVID-19 No   Admitted to ICU for COVID-19 No   Previously tested for COVID-19 Yes   Resident in a congregate (group) care setting No   Employed in healthcare setting No   Pregnant No      01/23/19 1521          Vitals/Pain Today's Vitals   01/23/19 1815 01/23/19 1830 01/23/19 1900 01/23/19 1930  BP: (!) 161/86 (!) 160/84 (!) 168/104 (!) 170/90  Pulse: 75 75 79 76  Resp: (!) 27 (!) 29 (!) 24 (!) 23  Temp:      TempSrc:      SpO2: 100% 100% 100% 100%  Weight:      Height:      PainSc:         Isolation Precautions No active isolations  Medications Medications  enoxaparin (LOVENOX) injection 30 mg (30 mg Subcutaneous Not Given 01/23/19 1805)  amLODipine (NORVASC) tablet 5 mg (has no administration in time range)  aspirin EC tablet 81 mg (81 mg Oral Not Given 01/23/19 1815)  atorvastatin (LIPITOR) tablet 80 mg (has no administration in time range)  carvedilol (COREG) tablet 12.5 mg (has no administration in time range)  DULoxetine (CYMBALTA) DR capsule 60 mg (60 mg Oral Not Given 01/23/19 1816)  gabapentin (NEURONTIN) capsule 300 mg (has no administration in time range)  allopurinol (ZYLOPRIM) tablet 150 mg (150 mg Oral Not Given 01/23/19 1816)  insulin aspart (novoLOG) injection 0-15 Units (has no administration in time range)  insulin aspart (novoLOG) injection 0-5 Units (has no administration in time range)  insulin glargine (LANTUS) injection 10 Units (has no administration in time range)  potassium chloride (KLOR-CON) CR tablet 10 mEq (has no administration in time range)  furosemide (LASIX) injection 60 mg (has no administration in time range)  furosemide (LASIX) injection 40 mg (40 mg Intravenous Given 01/23/19 1513)  potassium chloride SA (KLOR-CON) CR tablet 20 mEq (20 mEq Oral Given 01/23/19 1513)    Mobility walks Low fall risk

## 2019-01-23 NOTE — ED Notes (Signed)
Report given to Keane Scrape, RN on 4W.

## 2019-01-24 ENCOUNTER — Inpatient Hospital Stay (HOSPITAL_COMMUNITY): Payer: Medicare Other

## 2019-01-24 DIAGNOSIS — R1013 Epigastric pain: Secondary | ICD-10-CM

## 2019-01-24 DIAGNOSIS — I10 Essential (primary) hypertension: Secondary | ICD-10-CM

## 2019-01-24 DIAGNOSIS — I361 Nonrheumatic tricuspid (valve) insufficiency: Secondary | ICD-10-CM

## 2019-01-24 DIAGNOSIS — I340 Nonrheumatic mitral (valve) insufficiency: Secondary | ICD-10-CM | POA: Diagnosis not present

## 2019-01-24 DIAGNOSIS — I50814 Right heart failure due to left heart failure: Secondary | ICD-10-CM

## 2019-01-24 DIAGNOSIS — I5043 Acute on chronic combined systolic (congestive) and diastolic (congestive) heart failure: Secondary | ICD-10-CM

## 2019-01-24 LAB — CBC
HCT: 31.8 % — ABNORMAL LOW (ref 36.0–46.0)
Hemoglobin: 9.4 g/dL — ABNORMAL LOW (ref 12.0–15.0)
MCH: 27.1 pg (ref 26.0–34.0)
MCHC: 29.6 g/dL — ABNORMAL LOW (ref 30.0–36.0)
MCV: 91.6 fL (ref 80.0–100.0)
Platelets: 215 K/uL (ref 150–400)
RBC: 3.47 MIL/uL — ABNORMAL LOW (ref 3.87–5.11)
RDW: 14.6 % (ref 11.5–15.5)
WBC: 4.1 K/uL (ref 4.0–10.5)
nRBC: 0 % (ref 0.0–0.2)

## 2019-01-24 LAB — COMPREHENSIVE METABOLIC PANEL
ALT: 10 U/L (ref 0–44)
AST: 11 U/L — ABNORMAL LOW (ref 15–41)
Albumin: 2.6 g/dL — ABNORMAL LOW (ref 3.5–5.0)
Alkaline Phosphatase: 58 U/L (ref 38–126)
Anion gap: 7 (ref 5–15)
BUN: 35 mg/dL — ABNORMAL HIGH (ref 8–23)
CO2: 30 mmol/L (ref 22–32)
Calcium: 9 mg/dL (ref 8.9–10.3)
Chloride: 101 mmol/L (ref 98–111)
Creatinine, Ser: 1.65 mg/dL — ABNORMAL HIGH (ref 0.44–1.00)
GFR calc Af Amer: 38 mL/min — ABNORMAL LOW (ref >60)
GFR calc non Af Amer: 32 mL/min — ABNORMAL LOW (ref >60)
Glucose, Bld: 128 mg/dL — ABNORMAL HIGH (ref 70–99)
Potassium: 4.4 mmol/L (ref 3.5–5.1)
Sodium: 138 mmol/L (ref 135–145)
Total Bilirubin: 0.4 mg/dL (ref 0.3–1.2)
Total Protein: 5.4 g/dL — ABNORMAL LOW (ref 6.5–8.1)

## 2019-01-24 LAB — ECHOCARDIOGRAM COMPLETE
Height: 59.5 in
Weight: 2659.2 oz

## 2019-01-24 LAB — GLUCOSE, CAPILLARY
Glucose-Capillary: 120 mg/dL — ABNORMAL HIGH (ref 70–99)
Glucose-Capillary: 124 mg/dL — ABNORMAL HIGH (ref 70–99)
Glucose-Capillary: 153 mg/dL — ABNORMAL HIGH (ref 70–99)
Glucose-Capillary: 149 mg/dL — ABNORMAL HIGH (ref 70–99)
Glucose-Capillary: 97 mg/dL (ref 70–99)

## 2019-01-24 MED ORDER — PANTOPRAZOLE SODIUM 40 MG PO TBEC
40.0000 mg | DELAYED_RELEASE_TABLET | Freq: Every day | ORAL | Status: DC
  Administered 2019-01-24 – 2019-01-25 (×2): 40 mg via ORAL
  Filled 2019-01-24 (×3): qty 1

## 2019-01-24 MED ORDER — ISOSORB DINITRATE-HYDRALAZINE 20-37.5 MG PO TABS
1.0000 | ORAL_TABLET | Freq: Three times a day (TID) | ORAL | Status: DC
  Administered 2019-01-24 – 2019-02-07 (×41): 1 via ORAL
  Filled 2019-01-24 (×45): qty 1

## 2019-01-24 MED ORDER — ONDANSETRON HCL 4 MG/2ML IJ SOLN
4.0000 mg | Freq: Four times a day (QID) | INTRAMUSCULAR | Status: DC | PRN
  Administered 2019-01-24 – 2019-02-04 (×7): 4 mg via INTRAVENOUS
  Filled 2019-01-24 (×7): qty 2

## 2019-01-24 MED ORDER — IOHEXOL 300 MG/ML  SOLN
30.0000 mL | Freq: Once | INTRAMUSCULAR | Status: AC | PRN
  Administered 2019-01-24: 30 mL via ORAL

## 2019-01-24 NOTE — Progress Notes (Signed)
  Echocardiogram 2D Echocardiogram has been performed.  Kara Hanson 01/24/2019, 9:44 AM

## 2019-01-24 NOTE — Consult Note (Addendum)
Cardiology Consultation:   Patient ID: Kara Hanson MRN: [id number]; DOB: March 22, 1956  Admit date: 01/23/2019 Date of Consult: 01/24/2019  Primary Care Provider: [name], MD Primary Cardiologist: Candee Furbish, MD  Primary Electrophysiologist:  None    Patient Profile:   Kara Hanson is a 63 y.o. female with a history of normal coronary arteries on cath in 2018, possible stress-induced cardiomyopathy, hypertension, hyperlipidemia, and prior CVA who is being seen today for the evaluation of acute CHF at the request of Dr. Tawanna Solo.  History of Present Illness:   [name] is a 63 year old female with the above history who is followed by Dr. Marlou Porch. Patient first seen during hospitalization in 04/2016 syncope with sharp chest pain following syncopal episode. EKG showed new T wave inversion in inferior lateral leads and Echo showed LVEF of 30-35%. Therefore, she underwent left heart catheterization which showed essentially normal coronary arteries and interestingly normal LV function with EF of 50-55%. Felt to be possible stress-induced cardiomyopathy. She was started on beta-blocker and ARB. Repeat Echo in 06/2016 showed improved EF of 55-60% with grade 2 diastolic function and normal wall motion. Patient was last seen by Dr. Marlou Porch in 10/2018 and was doing well at that time. Most recent Echo on 11/01/2018 showed LVEF of 50% with indeterminate diastolic function and mild aortic stenosis with mean gradient of 10 mmHg.  She has had several admission over the last several months. In 07/2018, she was admitted with pneumonia due to [place] (this was reportedly after she had recently been admitted in the ICU up in [place]). She was also noted to be volume overloaded and received IV Lasix. Patient was admitted in 09/2018 with acute on chronic diastolic CHF with significant edema at time of admission. BNP 434 at that time. Patient was can diuresed with IV Lasix. She was admitted in 11/2018 for  acute hypoxic respiratory failure due to CAP. Possible some component of CHF at this time as well. She was diuresed and treated with antibiotics.  Patient presented to the ED yesterday via EMS for evaluation of shortness of breath.  Patient does have some chronic shortness of breath.  She is on 3 L via nasal cannula at at baseline.  However, patient reports worsening shortness of breath both at rest and with exertion as well as orthopnea, PND, lower extremity edema, weight gain over the last 1 to 2 weeks.  She has significant shortness of breath with relatively minimal activity. She increased O2 to 4 L without significant improvement.  When she goes up the steps she has to stop to catch her breath.  She also has some chest discomfort and lightheadedness with this but she thinks this is because she is having such difficulty breathing.  No other chest pain.  She states breathing got so bad yesterday that she could not take it is why she came to the emergency department.  He states her lower extremity swelling has gotten so bad feet started burning. She does not have a working scale at home so she is not sure how much weight she has gained. Weight at discharge in 11/2018 was 160 lbs and weight yesterday on admission was 168 lbs. Patient states her PCP increased her home Lasix from 54m daily to 650mdaily on Monday with no improvement. She notes some palpitations at home with activity and states when she goes up the steps she feels like her heart is going to beat outside of her chest.  She also states that  she thinks she may have passed out yesterday.  She was apparently sitting on the bed having trouble breathing and the next thing she remembers is laying down on the bed.  She does not know she really lost consciousness or for how long.  She is also has had some GI symptoms including some abdominal pain (mostly right upper quadrant), nausea, and decreased appetite.  She denies any fevers, chills, body aches.  Upon  arrival to the ED, patient mildly tachypneic and mildly hypertensive. EKG showed normal sinus rhythm with T wave inversion in anterolateral leads but no acute changes compared to prior tracings. High-sensitivity troponin negative x3. BNP elevated at 423.4. Chest x-ray showed increased bilateral lung opacity favored due to acute pulmonary edema with lung base atelectasis and possible small pleural effusion. WBC 4.7, Hgb 9.5, Plts 207. Na 137, K 4.4, Glucose 307, Cr 1.74 (up from 1.56 on 10/7). AST 14, ALT 11, Alk Phos 65. COVID-19 negative.  Started on IV Lasix and admitted for further work-up.  Echo today showed LVEF of 40 to 45% with global hypokinesis (worse apically) and moderate LVH with grade 3 diastolic dysfunction and elevated left atrial and left ventricular end-diastolic pressures.  Also noted to have mildly elevated PASP and moderate left pleural effusion.  Cardiology consulted for assistance.  At the time of this evaluation, patient reports some improvement in her breathing with the Lasix but still has some shortness of breath.  Her biggest complaint at this time seems to be right upper quadrant pain and nausea.  She states she also vomited earlier today.  Heart Pathway Score:     Past Medical History:  Diagnosis Date   CHF (congestive heart failure) (HCC)    CVA (cerebral vascular accident) ([place])    Diabetes mellitus without complication ([place])    Hypercholesteremia    Hypertension    Myocardial infarction ([place])    Pneumonia 11/01/2018   Spinal stenosis     Past Surgical History:  Procedure Laterality Date   BLADDER SURGERY     CARDIAC CATHETERIZATION N/A 04/25/2016   Procedure: Left Heart Cath and Coronary Angiography;  Surgeon: Lorretta Harp, MD;  Location: [facility];  Service: Cardiovascular;  Laterality: N/A;   CESAREAN SECTION     CHOLECYSTECTOMY       Home Medications:  Prior to Admission medications   Medication Sig Start Date End Date Taking?  Authorizing Provider  allopurinol (ZYLOPRIM) 300 MG tablet Take 1 tablet (300 mg total) by mouth daily. 01/08/19  Yes [name], MD  amLODipine (NORVASC) 5 MG tablet Take 1 tablet (5 mg total) by mouth at bedtime. 01/08/19 02/07/19 Yes [name], MD  aspirin EC 81 MG tablet Take 81 mg by mouth daily.   Yes [provider]  atorvastatin (LIPITOR) 80 MG tablet Take 1 tablet (80 mg total) by mouth daily at 6 PM. 01/08/19 02/07/19 Yes Newlin, Charlane Ferretti, MD  carvedilol (COREG) 12.5 MG tablet Take 1 tablet (12.5 mg total) by mouth 2 (two) times daily with a meal. 01/08/19  Yes Newlin, Enobong, MD  DULoxetine (CYMBALTA) 60 MG capsule Take 1 capsule (60 mg total) by mouth daily. For chronic back and neck pain Patient taking differently: Take 60 mg by mouth daily as needed. For chronic back and neck pain 01/08/19  Yes Newlin, Charlane Ferretti, MD  furosemide (LASIX) 40 MG tablet Take 1 tablet (40 mg total) by mouth daily. Patient taking differently: Take 40 mg by mouth 2 (two) times daily.  01/08/19  Yes [name], MD  gabapentin (NEURONTIN) 300 MG capsule Take 1 capsule (300 mg total) by mouth 2 (two) times daily. 01/08/19 02/07/19 Yes [name], MD  insulin aspart (NOVOLOG) 100 UNIT/ML injection 0 to 12 units subcutaneously 3 times daily before meals as per sliding scale 03/29/18  Yes Newlin, Enobong, MD  Insulin Glargine (BASAGLAR KWIKPEN) 100 UNIT/ML SOPN Inject 0.05 mLs (5 Units total) into the skin at bedtime. Patient taking differently: Inject 35 Units into the skin at bedtime.  10/02/18  Yes Bonnielee Haff, MD  lansoprazole (PREVACID) 15 MG capsule Take 15 mg by mouth daily.  10/02/18  Yes [provider]  omeprazole (PRILOSEC) 20 MG capsule Take 20 mg by mouth daily.   Yes [provider]  polyethylene glycol (MIRALAX / GLYCOLAX) 17 g packet Take 17 g by mouth daily. Patient taking differently: Take 17 g by mouth daily as needed for moderate constipation.  10/02/18  Yes  Bonnielee Haff, MD  SUPER B COMPLEX/C CAPS Take 1 capsule by mouth daily.   Yes [provider]  Accu-Chek FastClix Lancets MISC USE AS DIRECTED TO TEST BLOOD SUGAR THREE TIMES DAILY 07/31/18   [name], MD  Blood Glucose Monitoring Suppl (ACCU-CHEK GUIDE) w/Device KIT 1 each by Does not apply route 3 (three) times daily. 07/31/18   [name], MD  EASY COMFORT PEN NEEDLES 31G X 5 MM MISC USE FOUR TIMES PER DAY FOR INSULIN ADMINISTRATION Patient taking differently: 4 (four) times daily.  10/02/18   [name], MD  ergocalciferol (DRISDOL) 1.25 MG (50000 UT) capsule Take 1 capsule (50,000 Units total) by mouth once a week. 09/18/18   [name], MD  glucose blood (ACCU-CHEK GUIDE) test strip USE AS DIRECTED TO TEST BLOOD SUGAR THREE TIMES DAILY 07/31/18   [name], MD  guaiFENesin-dextromethorphan (ROBITUSSIN DM) 100-10 MG/5ML syrup Take 10 mLs by mouth every 6 (six) hours. Patient not taking: Reported on 01/08/2019 11/05/18   Deatra James, MD  lactobacillus acidophilus & bulgar (LACTINEX) chewable tablet Chew 1 tablet by mouth 3 (three) times daily with meals. Patient not taking: Reported on 01/23/2019 11/05/18   Deatra James, MD  Lancet Device MISC Use as instructed 3 times daily 09/19/17   Rodell Perna A, PA-C  methylPREDNISolone (MEDROL) 4 MG tablet Take 1 tablet (4 mg total) by mouth daily. Take medication as directed per dose pack Patient not taking: Reported on 01/08/2019 11/05/18   Deatra James, MD  Misc. Devices MISC Portable oxygen concentrator.  Diagnosis-chronic respiratory failure. 09/03/18   [name], MD  Misc. Devices MISC [place] with seat. Dx: Congestive Heart Failure 10/19/18   [name], MD  ondansetron (ZOFRAN) 4 MG tablet Take 1 tablet (4 mg total) by mouth every 6 (six) hours as needed for nausea. 07/25/18   Charlynne Cousins, MD    Inpatient Medications: Scheduled Meds:  allopurinol  150 mg Oral Daily   amLODipine  5  mg Oral QHS   aspirin EC  81 mg Oral Daily   atorvastatin  80 mg Oral q1800   carvedilol  12.5 mg Oral BID WC   DULoxetine  60 mg Oral Daily   enoxaparin (LOVENOX) injection  30 mg Subcutaneous Q24H   furosemide  60 mg Intravenous BID   gabapentin  300 mg Oral BID   insulin aspart  0-15 Units Subcutaneous TID WC   insulin aspart  0-5 Units Subcutaneous QHS   insulin glargine  10 Units Subcutaneous QHS   pantoprazole  40 mg Oral Daily   potassium chloride  10 mEq Oral BID   Continuous Infusions:  PRN Meds:   Allergies:    Allergies  Allergen Reactions   Garlic Shortness Of Breath, Itching and Swelling    Hand itching and swelling   Latex Itching   Morphine And Related Itching and Other (See Comments)    Headache    Other Itching    Reaction to newspaper ink -itching and headache    Social History:   Social History   Socioeconomic History   Marital status: Widowed    Spouse name: Not on file   Number of children: Not on file   Years of education: Not on file   Highest education level: Not on file  Occupational History   Not on file  Social Needs   Financial resource strain: Not on file   Food insecurity    Worry: Not on file    Inability: Not on file   Transportation needs    Medical: Not on file    Non-medical: Not on file  Tobacco Use   Smoking status: Never Smoker   Smokeless tobacco: Never Used  Substance and Sexual Activity   Alcohol use: No   Drug use: No   Sexual activity: Not Currently    Birth control/protection: None  Lifestyle   Physical activity    Days per week: Not on file    Minutes per session: Not on file   Stress: Not on file  Relationships   Social connections    Talks on phone: Not on file    Gets together: Not on file    Attends religious service: Not on file    Active member of club or organization: Not on file    Attends meetings of clubs or organizations: Not on file    Relationship status:  Not on file   Intimate partner violence    Fear of current or ex partner: Not on file    Emotionally abused: Not on file    Physically abused: Not on file    Forced sexual activity: Not on file  Other Topics Concern   Not on file  Social History Narrative   Not on file    Family History:    Family History  Problem Relation Age of Onset   Diabetes Mellitus II Father    Stroke Father    Healthy Mother        She is 37 years old.      ROS:  Please see the history of present illness.  Review of Systems  Constitutional: Negative for chills and fever.  HENT: Negative for congestion.   Respiratory: Positive for cough and shortness of breath. Negative for hemoptysis and sputum production.   Cardiovascular: Positive for chest pain, palpitations, orthopnea, leg swelling and PND.  Gastrointestinal: Positive for abdominal pain, nausea and vomiting. Negative for blood in stool and melena.  Genitourinary: Negative for hematuria.  Musculoskeletal: Positive for back pain. Negative for falls.  Neurological: Positive for dizziness (lightheadedness). Loss of consciousness: possible.  Endo/Heme/Allergies: Does not bruise/bleed easily.  Psychiatric/Behavioral: Negative for substance abuse.    All other ROS reviewed and negative.     Physical Exam/Data:   Vitals:   01/23/19 2000 01/23/19 2032 01/24/19 0508 01/24/19 1206  BP: (!) 166/88 (!) 172/80 (!) 134/57 140/70  Pulse: 78 75 73 71  Resp: (!) 23 (!) _0 Temp:  98.1 F (36.7 C) 98.6 F (37 C) 98.5 F (  36.9 C)  TempSrc:  Oral Oral Oral  SpO2: 99% 100% 98% 97%  Weight:  76.2 kg 75.4 kg   Height:  4' 11.5" (1.511 m)      Intake/Output Summary (Last 24 hours) at 01/24/2019 1340 Last data filed at 01/24/2019 0930 Gross per 24 hour  Intake 350 ml  Output --  Net 350 ml   Last 3 Weights 01/24/2019 01/23/2019 01/23/2019  Weight (lbs) 166 lb 3.2 oz 168 lb 1.6 oz 160 lb 15 oz  Weight (kg) 75.388 kg 76.25 kg 73 kg      Body mass index is 33.01 kg/m.  General: 63 y.o. female resting comfortably in no acute distress. HEENT: Normocephalic and atraumatic. Sclera clear. EOMs intact. Neck: Supple. No carotid bruits. JVD elevated Heart: RRR. Distinct S1 and S2. No murmurs, gallops, or rubs. Radial and distal pedal pulses 2+ and equal bilaterally. Lungs: Currently on 4 L of O2 via [place]. No significant increased work of breathing. Mild crackles noted in bases with decreased breath sounds more in right base. Abdomen: Soft, protuberant in right upper quadrant with tenderness to palpation. Bowel sounds present in all 4 quadrants.  MSK: Normal strength and tone for age. Extremities: 2+ pitting edema of bilateral lower extremities.   Skin: Warm and dry. Neuro: Alert and oriented x3. No focal deficits. Psych: Normal affect. Responds appropriately.  EKG:  The EKG was personally reviewed and demonstrates: Normal sinus rhythm, rate 79 bpm, with T wave inversion in anterolateral leads. No acute changes compared to prior tracings.  Telemetry:  Telemetry was personally reviewed and demonstrates:  Normal sinus rhythm with rates in the 60's to 70's.  Relevant CV Studies:  Echocardiogram 01/23/2019: Impressions: 1. Left ventricular ejection fraction, by visual estimation, is 40 to 45%. The left ventricle has normal function. There is moderately increased left ventricular hypertrophy.  2. Global hypokinesis, worse apically.  3. Left ventricular diastolic Doppler parameters are consistent with restrictive filling pattern of LV diastolic filling.  4. Elevated left atrial and left ventricular end-diastolic pressures.  5. Global right ventricle has mildly reduced systolic function.The right ventricular size is normal. No increase in right ventricular wall thickness.  6. Left atrial size was normal.  7. Right atrial size was normal.  8. Moderate pleural effusion in the left lateral region.  9. The mitral valve is abnormal. Mild  mitral valve regurgitation. 10. The tricuspid valve is grossly normal. Tricuspid valve regurgitation is mild. 11. The aortic valve is tricuspid Aortic valve regurgitation is trivial by color flow Doppler. Mild aortic valve sclerosis without stenosis. 12. The pulmonic valve was grossly normal. Pulmonic valve regurgitation is trivial by color flow Doppler. 13. Mildly elevated pulmonary artery systolic pressure. 14. The inferior vena cava is normal in size with <50% respiratory variability, suggesting right atrial pressure of 8 mmHg. _______________  Left Heart Catheterization 04/25/2016:  The left ventricular systolic function is normal.  LV end diastolic pressure is normal.  The left ventricular ejection fraction is 50-55% by visual estimate.  Impressions: Ms Poirier has essentially normal coronary arteries and normal LV function. I'm not sure what explains the discrepancy between the EF seen by 2-D echocardiogram and on left ventriculography. Medical therapy will be recommended. The patient may need further evaluation for syncope including either an event monitor or a loop recorder. The sheath was removed and a TR band was placed on the right wrist to achieve patient hemostasis. The patient left the lab in stable condition.  Laboratory Data:  High Sensitivity  Troponin:   Recent Labs  Lab 01/23/19 1343 01/23/19 1555 01/23/19 2034  TROPONINIHS _0 Chemistry Recent Labs  Lab 01/23/19 1343 01/24/19 0428  NA 137 138  K 4.4 4.4  CL 98 101  CO2 29 30  GLUCOSE 307* 128*  BUN 37* 35*  CREATININE 1.74* 1.65*  CALCIUM 9.1 9.0  GFRNONAA 30* 32*  GFRAA 35* 38*  ANIONGAP 10 7    Recent Labs  Lab 01/23/19 1343 01/24/19 0428  PROT 6.0* 5.4*  ALBUMIN 2.8* 2.6*  AST 14* 11*  ALT 11 10  ALKPHOS 65 58  BILITOT 0.3 0.4   Hematology Recent Labs  Lab 01/23/19 1343 01/24/19 0428  WBC 4.7 4.1  RBC 3.48* 3.47*  HGB 9.5* 9.4*  HCT 31.8* 31.8*  MCV 91.4 91.6  MCH 27.3  27.1  MCHC 29.9* 29.6*  RDW 14.9 14.6  PLT 207 215   BNP Recent Labs  Lab 01/23/19 1343  BNP 423.4*    DDimer No results for input(s): DDIMER in the last 168 hours.   Radiology/Studies:  Dg Chest Port 1 View  Result Date: 01/23/2019 CLINICAL DATA:  63 year old female with shortness of breath. On home oxygen. EXAM: PORTABLE CHEST 1 VIEW COMPARISON:  10/31/2018 and earlier. FINDINGS: Portable AP semi upright view at 1509 hours. Stable lung volumes and mediastinal contours. Visualized tracheal air column is within normal limits. Further increased bilateral interstitial and lung base opacity, with obscuration of the diaphragm now. No superimposed pneumothorax. No definite air bronchograms. Paucity bowel gas in the upper abdomen. No acute osseous abnormality identified. IMPRESSION: Increasing bilateral lung opacity favored due to acute pulmonary edema with lung base atelectasis and possibly small pleural effusions. Bilateral lower lobe pneumonia felt less likely. Electronically Signed   By: Genevie Ann M.D.   On: 01/23/2019 15:18    Assessment and Plan:   Acute on Chronic Combined CHF - Patient presented with worsening dyspnea, orthopnea, PND, lower extremity and weight gain.  - Chest x-ray favors edema over pneumonia. - BNP elevated in the 400's. - Repeat Echo today showed LVEF of 40-45% (down from 50% in 10/2018) global hypokinesis (worse apically) and moderate LVH with grade 3 diastolic dysfunction and elevated left atrial and left ventricular end-diastolic pressures.  Also noted to have mildly elevated PASP and moderate left pleural effusion. - Patient currently on IV Lasix 9m twice daily. Patient states she has had good urinary output with this but none is documented. Weight down 2 lbs from yesterday. Renal function stable.  - Continue current dose of IV Lasix. - Continue home Coreg 12.567mtwice daily. - Previously on ARB but this was discontinued during one of recent hospitalization due  to AKI.  - Unclear cause of frequent exacerbations. Patient states she is very careful about how much sodium she eats. Normal coronaries in 2018 so likely not ischemic in nature. - Continue to monitor daily weight, strict I/O's, and renal function.  Abdominal Pain and Nausea - Patient's biggest complaint today to me was abdominal pain and nausea. She also vomited once this morning. She states she feels like something is in her upper stomach. On exam, right upper quadrant is protuberant and that is where most of her pain is on palpation.  - LFTs on admission normal. - Consider further GI work-up. Will defer to primary team.   Hypertension - Most recent BP 140/70.  - Continue Coreg and Amlodipine.  Hyperlipidemia - Continue home statin.  Questionable Syncope - Patient  states that she thinks she may have passed out yesterday. She was apparently sitting on the bed having trouble breathing and the next thing she remembers is laying down on the bed.  She does not know she really lost consciousness or for how long. - I'm not sure what to make of this event as patient cannot give many details. She does reports feeling like heart is beating of her chest with minimal activity. Wonder if she would benefit from outpatient monitor. Will discuss with MD.  Acute on Chronic Kidney Disease Stage III - Creatinine 1.74 on admission (up 1.56 on 10/7). - Slightly better today at 1.65. - Avoid nephrotoxic agents. - Continue daily BMET.  For questions or updates, please contact [place] Please consult [email or web address] for contact info under     Signed, Darreld Mclean, PA-C  01/24/2019 1:40 PM

## 2019-01-24 NOTE — Progress Notes (Signed)
Pt complaining of abd pain after breakfast and meds, MD updated. Pt inct b/b, diarrhea stool. Pt states abd is burning. Md made aware, Protonix given, pt states some relief. Pt continue to c/o of abd pain and burning. ABD tender and distended. MD updated. Zofran given and MD will evaluate. SRP,RN

## 2019-01-24 NOTE — Progress Notes (Signed)
PROGRESS NOTE    Kara Hanson  QJJ:[id number] DOB: April 17, 1955 DOA: 01/23/2019 PCP: [name], MD   Brief Narrative:  Patient is a 63 female with history of congestive heart failure with action fraction 50% as per echo on 11/01/2018 who presented with shortness of breath, dyspnea on exertion, orthopnea.  She is on 4 L of oxygen at home 24/7 for her chronic hypoxia.  On presentation she was found to be in acute on  chronic CHF.  She had bilateral basal crackles, bilateral lower extremity edema and elevated JVD.  Echocardiogram done here today showed ejection fraction of 40 to 45%.  Currently on IV  Lasix.  I have requested cardiology consultation.  Assessment & Plan:   Active Problems:   Controlled type 2 diabetes with neuropathy (HCC)   CHF (congestive heart failure) (HCC)   Acute on chronic systolic CHF: Presented with dyspnea on minimal exertion, orthopnea.  Had bilateral lower extremity edema, basal crackles and elevated JVD on presentation.  Elevated BNP. echocardiogram showed decrease ejection fraction to 40 to 45%.  Currently on Lasix 60 mg twice a day.  Continue input and output monitoring, daily weight.  Fluid restriction to less than 1.5 L a day.  Salt restriction to less than 2 g a day.  Requested for cardiology evaluation.  AKI: Baseline creatinine ranges from 1.3-1.6.  Kidney function on baseline.  Continue to monitor kidney function while she is on Lasix.  Diabetes type 2: Recent hemoglobin A1c of 7.8.  On insulin at home.  Continue current insulin regimen here.  On Neurontin and Cymbalta for diabetic neuropathy.  Hypertension: Currently blood pressure stable.  Continue current medicines  Hyperlipidemia: Continue better  Gout: On allopurinol         DVT prophylaxis:Lovenox Code Status: Full Family Communication: None present at bedside Disposition Plan: Home after stabilization of CHF   Consultants: Cardiology consult called  Procedures: Echo   Antimicrobials:  Anti-infectives (From admission, onward)   None      Subjective:  Patient seen and examined the bedside this morning.  Hemodynamically stable.  Feels little better today.  Looks volume overloaded.  Not in acute respiratory distress  Objective: Vitals:   01/23/19 2000 01/23/19 2032 01/24/19 0508 01/24/19 1206  BP: (!) 166/88 (!) 172/80 (!) 134/57 140/70  Pulse: 78 75 73 71  Resp: (!) 23 (!) 24 18 16   Temp:  98.1 F (36.7 C) 98.6 F (37 C) 98.5 F (36.9 C)  TempSrc:  Oral Oral Oral  SpO2: 99% 100% 98% 97%  Weight:  76.2 kg 75.4 kg   Height:  4' 11.5" (1.511 m)      Intake/Output Summary (Last 24 hours) at 01/24/2019 1225 Last data filed at 01/24/2019 0930 Gross per 24 hour  Intake 350 ml  Output -  Net 350 ml   Filed Weights   01/23/19 1456 01/23/19 2032 01/24/19 0508  Weight: (S) 73 kg 76.2 kg 75.4 kg    Examination:  General exam: ,Not in distress, obese HEENT:PERRL,Oral mucosa moist, Ear/Nose normal on gross exam Respiratory system: Bilateral decreased air entry, basal crackles Cardiovascular system: S1 & S2 heard, RRR.  Elevated JVD, no murmurs, rubs, gallops or clicks.  2-3+ pedal edema. Gastrointestinal system: Abdomen is nondistended, soft and nontender. No organomegaly or masses felt. Normal bowel sounds heard. Central nervous system: Alert and oriented. No focal neurological deficits. Extremities: 2-3+ bilateral lower extremity edema, no clubbing ,no cyanosis, distal peripheral pulses palpable. Skin: No rashes, lesions or ulcers,no icterus ,no  pallor    Data Reviewed: I have personally reviewed following labs and imaging studies  CBC: Recent Labs  Lab 01/23/19 1343 01/24/19 0428  WBC 4.7 4.1  NEUTROABS 3.7  --   HGB 9.5* 9.4*  HCT 31.8* 31.8*  MCV 91.4 91.6  PLT 207 224   Basic Metabolic Panel: Recent Labs  Lab 01/23/19 1343 01/24/19 0428  NA 137 138  K 4.4 4.4  CL 98 101  CO2 29 30  GLUCOSE 307* 128*  BUN 37* 35*   CREATININE 1.74* 1.65*  CALCIUM 9.1 9.0   GFR: Estimated Creatinine Clearance: 30.9 mL/min (A) (by C-G formula based on SCr of 1.65 mg/dL (H)). Liver Function Tests: Recent Labs  Lab 01/23/19 1343 01/24/19 0428  AST 14* 11*  ALT 11 10  ALKPHOS 65 58  BILITOT 0.3 0.4  PROT 6.0* 5.4*  ALBUMIN 2.8* 2.6*   No results for input(s): LIPASE, AMYLASE in the last 168 hours. No results for input(s): AMMONIA in the last 168 hours. Coagulation Profile: No results for input(s): INR, PROTIME in the last 168 hours. Cardiac Enzymes: No results for input(s): CKTOTAL, CKMB, CKMBINDEX, TROPONINI in the last 168 hours. BNP (last 3 results) No results for input(s): PROBNP in the last 8760 hours. HbA1C: No results for input(s): HGBA1C in the last 72 hours. CBG: Recent Labs  Lab 01/23/19 2004 01/24/19 0512 01/24/19 0721 01/24/19 1204  GLUCAP 137* 120* 124* 153*   Lipid Profile: No results for input(s): CHOL, HDL, LDLCALC, TRIG, CHOLHDL, LDLDIRECT in the last 72 hours. Thyroid Function Tests: No results for input(s): TSH, T4TOTAL, FREET4, T3FREE, THYROIDAB in the last 72 hours. Anemia Panel: No results for input(s): VITAMINB12, FOLATE, FERRITIN, TIBC, IRON, RETICCTPCT in the last 72 hours. Sepsis Labs: No results for input(s): PROCALCITON, LATICACIDVEN in the last 168 hours.  Recent Results (from the past 240 hour(s))  SARS CORONAVIRUS 2 (TAT 6-24 HRS) Nasopharyngeal Nasopharyngeal Swab     Status: None   Collection Time: 01/23/19  3:55 PM   Specimen: Nasopharyngeal Swab  Result Value Ref Range Status   SARS Coronavirus 2 NEGATIVE NEGATIVE Final    Comment: (NOTE) SARS-CoV-2 target nucleic acids are NOT DETECTED. The SARS-CoV-2 RNA is generally detectable in upper and lower respiratory specimens during the acute phase of infection. Negative results do not preclude SARS-CoV-2 infection, do not rule out co-infections with other pathogens, and should not be used as the sole basis for  treatment or other patient management decisions. Negative results must be combined with clinical observations, patient history, and epidemiological information. The expected result is Negative. Fact Sheet for Patients: SugarRoll.be Fact Sheet for Healthcare Providers: https://www.woods-mathews.com/ This test is not yet approved or cleared by the [place] FDA and  has been authorized for detection and/or diagnosis of SARS-CoV-2 by FDA under an Emergency Use Authorization (EUA). This EUA will remain  in effect (meaning this test can be used) for the duration of the COVID-19 declaration under Section 56 4(b)(1) of the Act, 21 U.S.C. section 360bbb-3(b)(1), unless the authorization is terminated or revoked sooner. Performed at [facility], [place] [address], [place], [place] [address]          Radiology Studies: Dg Chest [place] 1 View  Result Date: 01/23/2019 CLINICAL DATA:  63 year old female with shortness of breath. On home oxygen. EXAM: PORTABLE CHEST 1 VIEW COMPARISON:  10/31/2018 and earlier. FINDINGS: Portable AP semi upright view at 1509 hours. Stable lung volumes and mediastinal contours. Visualized tracheal air column is  within normal limits. Further increased bilateral interstitial and lung base opacity, with obscuration of the diaphragm now. No superimposed pneumothorax. No definite air bronchograms. Paucity bowel gas in the upper abdomen. No acute osseous abnormality identified. IMPRESSION: Increasing bilateral lung opacity favored due to acute pulmonary edema with lung base atelectasis and possibly small pleural effusions. Bilateral lower lobe pneumonia felt less likely. Electronically Signed   By: Genevie Ann M.D.   On: 01/23/2019 15:18        Scheduled Meds: . allopurinol  150 mg Oral Daily  . amLODipine  5 mg Oral QHS  . aspirin EC  81 mg Oral Daily  . atorvastatin  80 mg Oral q1800  . carvedilol  12.5 mg Oral BID WC  .  DULoxetine  60 mg Oral Daily  . enoxaparin (LOVENOX) injection  30 mg Subcutaneous Q24H  . furosemide  60 mg Intravenous BID  . gabapentin  300 mg Oral BID  . insulin aspart  0-15 Units Subcutaneous TID WC  . insulin aspart  0-5 Units Subcutaneous QHS  . insulin glargine  10 Units Subcutaneous QHS  . potassium chloride  10 mEq Oral BID   Continuous Infusions:   LOS: 1 day    Time spent: 25 mins.More than 50% of that time was spent in counseling and/or coordination of care.      Shelly Coss, MD [facility] Pager [phone]  If 7PM-7AM, please contact night-coverage [email or web address] Password [facility] 01/24/2019, 12:25 PM

## 2019-01-25 DIAGNOSIS — R1013 Epigastric pain: Secondary | ICD-10-CM | POA: Diagnosis not present

## 2019-01-25 DIAGNOSIS — I509 Heart failure, unspecified: Secondary | ICD-10-CM | POA: Diagnosis not present

## 2019-01-25 LAB — BASIC METABOLIC PANEL
Anion gap: 7 (ref 5–15)
BUN: 41 mg/dL — ABNORMAL HIGH (ref 8–23)
CO2: 30 mmol/L (ref 22–32)
Calcium: 8.9 mg/dL (ref 8.9–10.3)
Chloride: 99 mmol/L (ref 98–111)
Creatinine, Ser: 1.95 mg/dL — ABNORMAL HIGH (ref 0.44–1.00)
GFR calc Af Amer: 31 mL/min — ABNORMAL LOW (ref >60)
GFR calc non Af Amer: 27 mL/min — ABNORMAL LOW (ref >60)
Glucose, Bld: 99 mg/dL (ref 70–99)
Potassium: 5.2 mmol/L — ABNORMAL HIGH (ref 3.5–5.1)
Sodium: 136 mmol/L (ref 135–145)

## 2019-01-25 LAB — GLUCOSE, CAPILLARY
Glucose-Capillary: 97 mg/dL (ref 70–99)
Glucose-Capillary: 130 mg/dL — ABNORMAL HIGH (ref 70–99)
Glucose-Capillary: 108 mg/dL — ABNORMAL HIGH (ref 70–99)
Glucose-Capillary: 153 mg/dL — ABNORMAL HIGH (ref 70–99)

## 2019-01-25 MED ORDER — DICYCLOMINE HCL 20 MG PO TABS
20.0000 mg | ORAL_TABLET | Freq: Three times a day (TID) | ORAL | Status: DC
  Administered 2019-01-25 – 2019-01-26 (×4): 20 mg via ORAL
  Filled 2019-01-25 (×6): qty 1

## 2019-01-25 NOTE — Progress Notes (Signed)
Progress Note  Patient Name: Kara Hanson Date of Encounter: 01/25/2019  Primary Cardiologist: Candee Furbish, MD   Subjective   Breathing much improved still with abd pain, greatest in RUQ   Inpatient Medications    Scheduled Meds:  allopurinol  150 mg Oral Daily   aspirin EC  81 mg Oral Daily   atorvastatin  80 mg Oral q1800   carvedilol  12.5 mg Oral BID WC   DULoxetine  60 mg Oral Daily   enoxaparin (LOVENOX) injection  30 mg Subcutaneous Q24H   furosemide  60 mg Intravenous BID   gabapentin  300 mg Oral BID   insulin aspart  0-15 Units Subcutaneous TID WC   insulin aspart  0-5 Units Subcutaneous QHS   insulin glargine  10 Units Subcutaneous QHS   isosorbide-hydrALAZINE  1 tablet Oral TID   pantoprazole  40 mg Oral Daily   Continuous Infusions:  PRN Meds: ondansetron (ZOFRAN) IV   Vital Signs    Vitals:   01/24/19 1206 01/24/19 2132 01/25/19 0500 01/25/19 0518  BP: 140/70 129/61  118/60  Pulse: 71 69  73  Resp: 16 18  18   Temp: 98.5 F (36.9 C) 98.7 F (37.1 C)  98 F (36.7 C)  TempSrc: Oral Oral  Oral  SpO2: 97% 96%  95%  Weight:   76.1 kg   Height:        Intake/Output Summary (Last 24 hours) at 01/25/2019 0844 Last data filed at 01/25/2019 0600 Gross per 24 hour  Intake 212 ml  Output --  Net 212 ml   Last 3 Weights 01/25/2019 01/24/2019 01/23/2019  Weight (lbs) 167 lb 12.3 oz 166 lb 3.2 oz 168 lb 1.6 oz  Weight (kg) 76.1 kg 75.388 kg 76.25 kg      Telemetry    SR - Personally Reviewed  ECG    No new - Personally Reviewed  Physical Exam   GEN: No acute distress. Though continued upper abd pain  Neck: No JVD Cardiac: RRR, no murmurs, rubs, or gallops.  Respiratory: Clear diminished in basesto auscultation bilaterally. GI: Soft, +tenderness, rt upper quad the most tender, non-distended  MS: No edema; No deformity. Neuro:  Nonfocal  Psych: Normal affect   Labs    High Sensitivity Troponin:   Recent Labs  Lab  01/23/19 1343 01/23/19 1555 01/23/19 2034  TROPONINIHS 10 11 13       Chemistry Recent Labs  Lab 01/23/19 1343 01/24/19 0428 01/25/19 0418  NA 137 138 136  K 4.4 4.4 5.2*  CL 98 101 99  CO2 29 30 30   GLUCOSE 307* 128* 99  BUN 37* 35* 41*  CREATININE 1.74* 1.65* 1.95*  CALCIUM 9.1 9.0 8.9  PROT 6.0* 5.4*  --   ALBUMIN 2.8* 2.6*  --   AST 14* 11*  --   ALT 11 10  --   ALKPHOS 65 58  --   BILITOT 0.3 0.4  --   GFRNONAA 30* 32* 27*  GFRAA 35* 38* 31*  ANIONGAP 10 7 7      Hematology Recent Labs  Lab 01/23/19 1343 01/24/19 0428  WBC 4.7 4.1  RBC 3.48* 3.47*  HGB 9.5* 9.4*  HCT 31.8* 31.8*  MCV 91.4 91.6  MCH 27.3 27.1  MCHC 29.9* 29.6*  RDW 14.9 14.6  PLT 207 215    BNP Recent Labs  Lab 01/23/19 1343  BNP 423.4*     DDimer No results for input(s): DDIMER in the last 168 hours.  Radiology    Ct Abdomen Pelvis Wo Contrast  Result Date: 01/24/2019 CLINICAL DATA:  63 year old female with history of abdominal distension. Epigastric abdominal pain. EXAM: CT ABDOMEN AND PELVIS WITHOUT CONTRAST TECHNIQUE: Multidetector CT imaging of the abdomen and pelvis was performed following the standard protocol without IV contrast. COMPARISON:  CT the abdomen and pelvis 01/02/2018. FINDINGS: Lower chest: Moderate bilateral pleural effusions with areas of atelectasis and/or consolidation in the lower lobes of the lungs bilaterally. Atherosclerotic calcifications in the thoracic aorta as well as the left main, left anterior descending, left circumflex and right coronary arteries. Calcifications of the aortic valve. Cardiomegaly. Hepatobiliary: No suspicious cystic or solid hepatic lesions are confidently identified on today's noncontrast CT examination. Status post cholecystectomy. Pancreas: Fatty atrophy in the pancreas. No discrete pancreatic mass, no definite pancreatic mass or peripancreatic fluid collections or inflammatory changes are noted on today's noncontrast CT  examination. Spleen: Unremarkable. Adrenals/Urinary Tract: Unenhanced appearance of the kidneys and bilateral adrenal glands is unremarkable. No hydroureteronephrosis. Urinary bladder is unremarkable in appearance. Stomach/Bowel: Unenhanced appearance of the stomach is normal. There is no pathologic dilatation of small bowel or colon. Multiple scattered colonic diverticulae are noted, without surrounding inflammatory changes to suggest an acute diverticulitis at this time. Normal appendix. Vascular/Lymphatic: Aortic atherosclerosis. Retroaortic left renal vein (normal anatomical variant) incidentally noted. No lymphadenopathy identified in the abdomen or pelvis. Reproductive: Unenhanced appearance of the uterus and ovaries is unremarkable. Other: Small umbilical hernia containing only omental fat. No significant volume of ascites. No pneumoperitoneum. Musculoskeletal: There are no aggressive appearing lytic or blastic lesions noted in the visualized portions of the skeleton. IMPRESSION: 1. No acute findings are noted in the abdomen or pelvis to account for the patient's symptoms. 2. Moderate bilateral pleural effusions with dependent areas of atelectasis and/or consolidation in the lower lobes of the lungs bilaterally. 3. Colonic diverticulosis without evidence of acute diverticulitis at this time. 4. Small umbilical hernia containing only omental fat. No associated bowel incarceration or obstruction at this time. Electronically Signed   By: Vinnie Langton M.D.   On: 01/24/2019 18:09   Dg Chest Port 1 View  Result Date: 01/23/2019 CLINICAL DATA:  63 year old female with shortness of breath. On home oxygen. EXAM: PORTABLE CHEST 1 VIEW COMPARISON:  10/31/2018 and earlier. FINDINGS: Portable AP semi upright view at 1509 hours. Stable lung volumes and mediastinal contours. Visualized tracheal air column is within normal limits. Further increased bilateral interstitial and lung base opacity, with obscuration of  the diaphragm now. No superimposed pneumothorax. No definite air bronchograms. Paucity bowel gas in the upper abdomen. No acute osseous abnormality identified. IMPRESSION: Increasing bilateral lung opacity favored due to acute pulmonary edema with lung base atelectasis and possibly small pleural effusions. Bilateral lower lobe pneumonia felt less likely. Electronically Signed   By: Genevie Ann M.D.   On: 01/23/2019 15:18    Cardiac Studies   Echocardiogram 01/23/2019: Impressions: 1. Left ventricular ejection fraction, by visual estimation, is 40 to 45%. The left ventricle has normal function. There is moderately increased left ventricular hypertrophy. 2. Global hypokinesis, worse apically. 3. Left ventricular diastolic Doppler parameters are consistent with restrictive filling pattern of LV diastolic filling. 4. Elevated left atrial and left ventricular end-diastolic pressures. 5. Global right ventricle has mildly reduced systolic function.The right ventricular size is normal. No increase in right ventricular wall thickness. 6. Left atrial size was normal. 7. Right atrial size was normal. 8. Moderate pleural effusion in the left lateral region. 9. The mitral  valve is abnormal. Mild mitral valve regurgitation. 10. The tricuspid valve is grossly normal. Tricuspid valve regurgitation is mild. 11. The aortic valve is tricuspid Aortic valve regurgitation is trivial by color flow Doppler. Mild aortic valve sclerosis without stenosis. 12. The pulmonic valve was grossly normal. Pulmonic valve regurgitation is trivial by color flow Doppler. 13. Mildly elevated pulmonary artery systolic pressure. 14. The inferior vena cava is normal in size with <50% respiratory variability, suggesting right atrial pressure of 8 mmHg. _______________  Left Heart Catheterization 04/25/2016:  The left ventricular systolic function is normal.  LV end diastolic pressure is normal.  The left ventricular ejection  fraction is 50-55% by visual estimate.  Impressions: Ms Bady has essentially normal coronary arteries and normal LV function. I'm not sure what explains the discrepancy between the EF seen by 2-D echocardiogram and on left ventriculography. Medical therapy will be recommended. The patient may need further evaluation for syncope including either an event monitor or a loop recorder. The sheath was removed and a TR band was placed on the right wrist to achieve patient hemostasis. The patient left the lab in stable condition  Patient Profile     63 y.o. female with a history of normal coronary arteries on cath in 2018, possible stress-induced cardiomyopathy with improvement, hypertension, hyperlipidemia, and prior CVA now admitted with CHF.   Assessment & Plan    Acute on Chronic Combined CHF - Patient presented with worsening dyspnea, orthopnea, PND, lower extremity and weight gain.  - Chest x-ray favors edema over pneumonia. - BNP elevated in the 400's. - Repeat Echo today showed LVEF of 40-45% (down from 50% in 10/2018) global hypokinesis (worse apically) and moderate LVH with grade 3 diastolic dysfunction and elevated left atrial and left ventricular end-diastolic pressures.  Also noted to have mildly elevated PASP and moderate left pleural effusion. - Patient currently on IV Lasix 60mg  twice daily. Patient states she has had good urinary output with this but none is documented. Weight down 2 lbs from yesterday but today up 1.54 lbs.  - decrease current dose of IV Lasix. Or hold today.  Not sure if rec'd AM dose - Continue home Coreg 12.5mg  twice daily. - Previously on ARB but this was discontinued during one of recent hospitalization due to AKI.  - Unclear cause of frequent exacerbations. Patient states she is very careful about how much sodium she eats. Normal coronaries in 2018 so likely not ischemic in nature. - Continue to monitor daily weight, strict I/O's, and renal function. - Cr  climbing now 1.95 up from 1.65 breathing improved - + pl effusions on CT of abd.    Abdominal Pain and Nausea - Patient's biggest complaint today to me was abdominal pain and nausea. She also vomited once this morning. She states she feels like something is in her upper stomach. On exam, right upper quadrant is protuberant and that is where most of her pain is on palpation.  - LFTs on admission normal. - Consider further GI work-up. Will defer to primary team.  CT of abd with diverticulosis without diverticulitis.   Hypertension - Most recent BP 118/60 .  - Continue Coreg and bidil .  Hyperlipidemia - Continue lipitor at 80 mg LDL is 251, TG 241 HDL 52.. pt not taking her statin all the time per pt.    Questionable Syncope - Patient states that she thinks she may have passed out yesterday. She was apparently sitting on the bed having trouble breathing and the next  thing she remembers is laying down on the bed.  She does not know she really lost consciousness or for how long. - I'm not sure what to make of this event as patient cannot give many details. She does reports feeling like heart is beating of her chest with minimal activity. Wonder if she would benefit from outpatient monitor.   Acute on Chronic Kidney Disease Stage III - Creatinine 1.74 on admission (up 1.56 on 10/7). -  Elevated to 1.95 today  - Avoid nephrotoxic agents. - Continue daily BMET.     For questions or updates, please contact [place] Please consult [email or web address] for contact info under        Signed, Cecilie Kicks, NP  01/25/2019, 8:44 AM

## 2019-01-25 NOTE — Care Management Important Message (Signed)
Important Message  Patient Details IM letter given to Cookie McGibboney RN to present to the Patient Name: Kara Hanson MRN: [id number] Date of Birth: 06-23-1955   Medicare Important Message Given:  Yes     Kerin Salen 01/25/2019, 10:52 AM

## 2019-01-25 NOTE — Progress Notes (Signed)
Pt continues to rest in bed, only up to the [facility] throughout the day. Pt did not eat Breakfast or lunch due to nausea. Medicated as ordered. Pt able to tolerate meds. Meds given for abd cramping, feel somewhat better this pm, and able to tolerate dinner without nausea and vomiting (mashed potatoes, vanilla pudding and ginger ale). States breathing is improving. NSR on monitor.  SRP, RN

## 2019-01-25 NOTE — Progress Notes (Addendum)
PROGRESS NOTE    Kara Hanson  QVZ:[id number] DOB: 02/19/1956 DOA: 01/23/2019 PCP: [name], MD   Brief Narrative:  Patient is a 63 female with history of congestive heart failure with action fraction 50% as per echo on 11/01/2018 who presented with shortness of breath, dyspnea on exertion, orthopnea.  She is on 4 L of oxygen at home 24/7 for her chronic hypoxia.  On presentation she was found to be in acute on  chronic CHF.  She had bilateral basal crackles, bilateral lower extremity edema and elevated JVD.  Echocardiogram done here today showed ejection fraction of 40 to 45%.  Currently on IV  Lasix.  Cardiology following.  Assessment & Plan:   Active Problems:   Acute on chronic combined systolic and diastolic CHF (congestive heart failure) (HCC)   Controlled type 2 diabetes with neuropathy (HCC)   CHF (congestive heart failure) (HCC)   Epigastric pain   Acute on chronic systolic CHF: Presented with dyspnea on minimal exertion, orthopnea.  Had bilateral lower extremity edema, basal crackles and elevated JVD on presentation.  Elevated BNP. echocardiogram showed decrease ejection fraction to 40 to 45%.  Currently on Lasix 60 mg twice a day.  Continue input and output monitoring, daily weight.  Fluid restriction to less than 1.5 L a day.  Salt restriction to less than 2 g a day.  Cardiology following.  Currently on carvedilol and BiDil. CT abdomen/pelvis done yesterday showed bilateral moderate pleural effusion.  Patient denied thoracentesis.  AKI: Baseline creatinine ranges from 1.3-1.6.  Creatinine went up today.  Will wait for cardiology recommendation for adjustment of diuretics.  Abdominal discomfort/pain: CT abdomen/pelvis done yesterday did not show any acute intra-abdominal abnormalities.  Continue Protonix.  Dicyclomine  Diabetes type 2: Recent hemoglobin A1c of 7.8.  On insulin at home.  Continue current insulin regimen here.  On Neurontin and Cymbalta for diabetic  neuropathy.  Hypertension: Currently blood pressure stable.  Continue current medicines  Hyperlipidemia: Continue better  Gout: On allopurinol         DVT prophylaxis:Lovenox Code Status: Full Family Communication: Called daughter on phone on 01/25/19 Disposition Plan: Home after stabilization of CHF   Consultants: Cardiology consult called  Procedures: Echo  Antimicrobials:  Anti-infectives (From admission, onward)   None      Subjective:  Patient seen and examined the bedside this morning.  Hemodynamically stable.  She feels less short of breath today but is bothered by continuous abdominal discomfort, distention.  Explained that CT scan did not show any acute intra-abdominal abnormalities.  Her abdomen is soft and nontender.  But has mild distention.  Objective: Vitals:   01/24/19 1206 01/24/19 2132 01/25/19 0500 01/25/19 0518  BP: 140/70 129/61  118/60  Pulse: 71 69  73  Resp: 16 18  18   Temp: 98.5 F (36.9 C) 98.7 F (37.1 C)  98 F (36.7 C)  TempSrc: Oral Oral  Oral  SpO2: 97% 96%  95%  Weight:   76.1 kg   Height:        Intake/Output Summary (Last 24 hours) at 01/25/2019 1233 Last data filed at 01/25/2019 1029 Gross per 24 hour  Intake 292 ml  Output 500 ml  Net -208 ml   Filed Weights   01/23/19 2032 01/24/19 0508 01/25/19 0500  Weight: 76.2 kg 75.4 kg 76.1 kg    Examination:  General exam: Not in distress, obese HEENT:PERRL,Oral mucosa moist, Ear/Nose normal on gross exam Respiratory system: Bilateral decreased air entry, basal crackles Cardiovascular  system: S1 & S2 heard, RRR.  Elevated JVD, no murmurs, rubs, gallops or clicks.  2-3+ pedal edema. Gastrointestinal system: Abdomen is mildly diistended, soft and nontender. No organomegaly or masses felt. Normal bowel sounds heard. Central nervous system: Alert and oriented. No focal neurological deficits. Extremities: 2-3+ bilateral lower extremity edema, no clubbing ,no cyanosis, distal  peripheral pulses palpable. Skin: No rashes, lesions or ulcers,no icterus ,no pallor    Data Reviewed: I have personally reviewed following labs and imaging studies  CBC: Recent Labs  Lab 01/23/19 1343 01/24/19 0428  WBC 4.7 4.1  NEUTROABS 3.7  --   HGB 9.5* 9.4*  HCT 31.8* 31.8*  MCV 91.4 91.6  PLT 207 947   Basic Metabolic Panel: Recent Labs  Lab 01/23/19 1343 01/24/19 0428 01/25/19 0418  NA 137 138 136  K 4.4 4.4 5.2*  CL 98 101 99  CO2 29 30 30   GLUCOSE 307* 128* 99  BUN 37* 35* 41*  CREATININE 1.74* 1.65* 1.95*  CALCIUM 9.1 9.0 8.9   GFR: Estimated Creatinine Clearance: 26.3 mL/min (A) (by C-G formula based on SCr of 1.95 mg/dL (H)). Liver Function Tests: Recent Labs  Lab 01/23/19 1343 01/24/19 0428  AST 14* 11*  ALT 11 10  ALKPHOS 65 58  BILITOT 0.3 0.4  PROT 6.0* 5.4*  ALBUMIN 2.8* 2.6*   No results for input(s): LIPASE, AMYLASE in the last 168 hours. No results for input(s): AMMONIA in the last 168 hours. Coagulation Profile: No results for input(s): INR, PROTIME in the last 168 hours. Cardiac Enzymes: No results for input(s): CKTOTAL, CKMB, CKMBINDEX, TROPONINI in the last 168 hours. BNP (last 3 results) No results for input(s): PROBNP in the last 8760 hours. HbA1C: No results for input(s): HGBA1C in the last 72 hours. CBG: Recent Labs  Lab 01/24/19 1204 01/24/19 1621 01/24/19 2128 01/25/19 0726 01/25/19 1155  GLUCAP 153* 149* 97 97 130*   Lipid Profile: No results for input(s): CHOL, HDL, LDLCALC, TRIG, CHOLHDL, LDLDIRECT in the last 72 hours. Thyroid Function Tests: No results for input(s): TSH, T4TOTAL, FREET4, T3FREE, THYROIDAB in the last 72 hours. Anemia Panel: No results for input(s): VITAMINB12, FOLATE, FERRITIN, TIBC, IRON, RETICCTPCT in the last 72 hours. Sepsis Labs: No results for input(s): PROCALCITON, LATICACIDVEN in the last 168 hours.  Recent Results (from the past 240 hour(s))  SARS CORONAVIRUS 2 (TAT 6-24 HRS)  Nasopharyngeal Nasopharyngeal Swab     Status: None   Collection Time: 01/23/19  3:55 PM   Specimen: Nasopharyngeal Swab  Result Value Ref Range Status   SARS Coronavirus 2 NEGATIVE NEGATIVE Final    Comment: (NOTE) SARS-CoV-2 target nucleic acids are NOT DETECTED. The SARS-CoV-2 RNA is generally detectable in upper and lower respiratory specimens during the acute phase of infection. Negative results do not preclude SARS-CoV-2 infection, do not rule out co-infections with other pathogens, and should not be used as the sole basis for treatment or other patient management decisions. Negative results must be combined with clinical observations, patient history, and epidemiological information. The expected result is Negative. Fact Sheet for Patients: SugarRoll.be Fact Sheet for Healthcare Providers: https://www.woods-mathews.com/ This test is not yet approved or cleared by the [place] FDA and  has been authorized for detection and/or diagnosis of SARS-CoV-2 by FDA under an Emergency Use Authorization (EUA). This EUA will remain  in effect (meaning this test can be used) for the duration of the COVID-19 declaration under Section 56 4(b)(1) of the Act, 21 U.S.C. section 360bbb-3(b)(1), unless  the authorization is terminated or revoked sooner. Performed at [facility], [place] [address] [place], [place] [address]          Radiology Studies: Ct Abdomen Pelvis Wo Contrast  Result Date: 01/24/2019 CLINICAL DATA:  63 year old female with history of abdominal distension. Epigastric abdominal pain. EXAM: CT ABDOMEN AND PELVIS WITHOUT CONTRAST TECHNIQUE: Multidetector CT imaging of the abdomen and pelvis was performed following the standard protocol without IV contrast. COMPARISON:  CT the abdomen and pelvis 01/02/2018. FINDINGS: Lower chest: Moderate bilateral pleural effusions with areas of atelectasis and/or consolidation in the lower  lobes of the lungs bilaterally. Atherosclerotic calcifications in the thoracic aorta as well as the left main, left anterior descending, left circumflex and right coronary arteries. Calcifications of the aortic valve. Cardiomegaly. Hepatobiliary: No suspicious cystic or solid hepatic lesions are confidently identified on today's noncontrast CT examination. Status post cholecystectomy. Pancreas: Fatty atrophy in the pancreas. No discrete pancreatic mass, no definite pancreatic mass or peripancreatic fluid collections or inflammatory changes are noted on today's noncontrast CT examination. Spleen: Unremarkable. Adrenals/Urinary Tract: Unenhanced appearance of the kidneys and bilateral adrenal glands is unremarkable. No hydroureteronephrosis. Urinary bladder is unremarkable in appearance. Stomach/Bowel: Unenhanced appearance of the stomach is normal. There is no pathologic dilatation of small bowel or colon. Multiple scattered colonic diverticulae are noted, without surrounding inflammatory changes to suggest an acute diverticulitis at this time. Normal appendix. Vascular/Lymphatic: Aortic atherosclerosis. Retroaortic left renal vein (normal anatomical variant) incidentally noted. No lymphadenopathy identified in the abdomen or pelvis. Reproductive: Unenhanced appearance of the uterus and ovaries is unremarkable. Other: Small umbilical hernia containing only omental fat. No significant volume of ascites. No pneumoperitoneum. Musculoskeletal: There are no aggressive appearing lytic or blastic lesions noted in the visualized portions of the skeleton. IMPRESSION: 1. No acute findings are noted in the abdomen or pelvis to account for the patient's symptoms. 2. Moderate bilateral pleural effusions with dependent areas of atelectasis and/or consolidation in the lower lobes of the lungs bilaterally. 3. Colonic diverticulosis without evidence of acute diverticulitis at this time. 4. Small umbilical hernia containing only  omental fat. No associated bowel incarceration or obstruction at this time. Electronically Signed   By: Vinnie Langton M.D.   On: 01/24/2019 18:09   Dg Chest Port 1 View  Result Date: 01/23/2019 CLINICAL DATA:  63 year old female with shortness of breath. On home oxygen. EXAM: PORTABLE CHEST 1 VIEW COMPARISON:  10/31/2018 and earlier. FINDINGS: Portable AP semi upright view at 1509 hours. Stable lung volumes and mediastinal contours. Visualized tracheal air column is within normal limits. Further increased bilateral interstitial and lung base opacity, with obscuration of the diaphragm now. No superimposed pneumothorax. No definite air bronchograms. Paucity bowel gas in the upper abdomen. No acute osseous abnormality identified. IMPRESSION: Increasing bilateral lung opacity favored due to acute pulmonary edema with lung base atelectasis and possibly small pleural effusions. Bilateral lower lobe pneumonia felt less likely. Electronically Signed   By: Genevie Ann M.D.   On: 01/23/2019 15:18        Scheduled Meds:  allopurinol  150 mg Oral Daily   aspirin EC  81 mg Oral Daily   atorvastatin  80 mg Oral q1800   carvedilol  12.5 mg Oral BID WC   dicyclomine  20 mg Oral TID AC & HS   DULoxetine  60 mg Oral Daily   enoxaparin (LOVENOX) injection  30 mg Subcutaneous Q24H   furosemide  60 mg Intravenous BID   gabapentin  300 mg Oral BID   insulin aspart  0-15 Units Subcutaneous TID WC   insulin aspart  0-5 Units Subcutaneous QHS   insulin glargine  10 Units Subcutaneous QHS   isosorbide-hydrALAZINE  1 tablet Oral TID   pantoprazole  40 mg Oral Daily   Continuous Infusions:   LOS: 2 days    Time spent: 25 mins.More than 50% of that time was spent in counseling and/or coordination of care.      Shelly Coss, MD [facility] Pager [phone]  If 7PM-7AM, please contact night-coverage [email or web address] Password [email or web address] 01/25/2019, 12:33 PM

## 2019-01-26 DIAGNOSIS — N179 Acute kidney failure, unspecified: Secondary | ICD-10-CM

## 2019-01-26 DIAGNOSIS — I5043 Acute on chronic combined systolic (congestive) and diastolic (congestive) heart failure: Secondary | ICD-10-CM | POA: Diagnosis not present

## 2019-01-26 DIAGNOSIS — N183 Chronic kidney disease, stage 3 unspecified: Secondary | ICD-10-CM

## 2019-01-26 DIAGNOSIS — I10 Essential (primary) hypertension: Secondary | ICD-10-CM | POA: Diagnosis not present

## 2019-01-26 DIAGNOSIS — E785 Hyperlipidemia, unspecified: Secondary | ICD-10-CM

## 2019-01-26 LAB — BASIC METABOLIC PANEL
Anion gap: 10 (ref 5–15)
BUN: 44 mg/dL — ABNORMAL HIGH (ref 8–23)
CO2: 30 mmol/L (ref 22–32)
Calcium: 9.2 mg/dL (ref 8.9–10.3)
Chloride: 97 mmol/L — ABNORMAL LOW (ref 98–111)
Creatinine, Ser: 2.21 mg/dL — ABNORMAL HIGH (ref 0.44–1.00)
GFR calc Af Amer: 26 mL/min — ABNORMAL LOW (ref >60)
GFR calc non Af Amer: 23 mL/min — ABNORMAL LOW (ref >60)
Glucose, Bld: 105 mg/dL — ABNORMAL HIGH (ref 70–99)
Potassium: 4.8 mmol/L (ref 3.5–5.1)
Sodium: 137 mmol/L (ref 135–145)

## 2019-01-26 LAB — GLUCOSE, CAPILLARY
Glucose-Capillary: 95 mg/dL (ref 70–99)
Glucose-Capillary: 83 mg/dL (ref 70–99)
Glucose-Capillary: 95 mg/dL (ref 70–99)
Glucose-Capillary: 104 mg/dL — ABNORMAL HIGH (ref 70–99)
Glucose-Capillary: 110 mg/dL — ABNORMAL HIGH (ref 70–99)

## 2019-01-26 LAB — LIPASE, BLOOD: Lipase: 20 U/L (ref 11–51)

## 2019-01-26 MED ORDER — PANTOPRAZOLE SODIUM 40 MG IV SOLR
40.0000 mg | Freq: Two times a day (BID) | INTRAVENOUS | Status: DC
  Administered 2019-01-26 – 2019-02-07 (×25): 40 mg via INTRAVENOUS
  Filled 2019-01-26 (×24): qty 40

## 2019-01-26 MED ORDER — SODIUM CHLORIDE 0.9 % IV SOLN
INTRAVENOUS | Status: DC
  Administered 2019-01-26: 17:00:00 via INTRAVENOUS

## 2019-01-26 NOTE — Progress Notes (Signed)
Progress Note  Patient Name: Kara Hanson Date of Encounter: 01/26/2019  Primary Cardiologist: Candee Furbish, MD   Subjective   Slightly SOB with exertion. Denies chest pain and abdominal pain.  Inpatient Medications    Scheduled Meds: . allopurinol  150 mg Oral Daily  . aspirin EC  81 mg Oral Daily  . atorvastatin  80 mg Oral q1800  . carvedilol  12.5 mg Oral BID WC  . dicyclomine  20 mg Oral TID AC & HS  . DULoxetine  60 mg Oral Daily  . enoxaparin (LOVENOX) injection  30 mg Subcutaneous Q24H  . gabapentin  300 mg Oral BID  . insulin aspart  0-15 Units Subcutaneous TID WC  . insulin aspart  0-5 Units Subcutaneous QHS  . insulin glargine  10 Units Subcutaneous QHS  . isosorbide-hydrALAZINE  1 tablet Oral TID  . pantoprazole  40 mg Oral Daily   Continuous Infusions:  PRN Meds: ondansetron (ZOFRAN) IV   Vital Signs    Vitals:   01/25/19 1415 01/25/19 2117 01/25/19 2315 01/26/19 0607  BP: 121/62 120/60  140/64  Pulse: 68 61  72  Resp: 18 18  18   Temp: 98.2 F (36.8 C) 98.5 F (36.9 C) 98.7 F (37.1 C) 98.2 F (36.8 C)  TempSrc: Oral Oral Oral Oral  SpO2: 97% 100%  99%  Weight:    74.2 kg  Height:        Intake/Output Summary (Last 24 hours) at 01/26/2019 0845 Last data filed at 01/25/2019 1744 Gross per 24 hour  Intake 360 ml  Output 800 ml  Net -440 ml   Filed Weights   01/24/19 0508 01/25/19 0500 01/26/19 0607  Weight: 75.4 kg 76.1 kg 74.2 kg    Telemetry    Sinus rhythm - Personally Reviewed   Physical Exam   GEN: No acute distress.   Neck: No JVD Cardiac: RRR, no murmurs, rubs, or gallops.  Respiratory: Faint crackles at bases bilaterally. GI: Soft, nontender, non-distended  MS: No edema; No deformity. Neuro:  Nonfocal  Psych: Normal affect   Labs    Chemistry Recent Labs  Lab 01/23/19 1343 01/24/19 0428 01/25/19 0418 01/26/19 0507  NA 137 138 136 137  K 4.4 4.4 5.2* 4.8  CL 98 101 99 97*  CO2 29 30 30 30   GLUCOSE 307*  128* 99 105*  BUN 37* 35* 41* 44*  CREATININE 1.74* 1.65* 1.95* 2.21*  CALCIUM 9.1 9.0 8.9 9.2  PROT 6.0* 5.4*  --   --   ALBUMIN 2.8* 2.6*  --   --   AST 14* 11*  --   --   ALT 11 10  --   --   ALKPHOS 65 58  --   --   BILITOT 0.3 0.4  --   --   GFRNONAA 30* 32* 27* 23*  GFRAA 35* 38* 31* 26*  ANIONGAP 10 7 7 10      Hematology Recent Labs  Lab 01/23/19 1343 01/24/19 0428  WBC 4.7 4.1  RBC 3.48* 3.47*  HGB 9.5* 9.4*  HCT 31.8* 31.8*  MCV 91.4 91.6  MCH 27.3 27.1  MCHC 29.9* 29.6*  RDW 14.9 14.6  PLT 207 215    Cardiac EnzymesNo results for input(s): TROPONINI in the last 168 hours. No results for input(s): TROPIPOC in the last 168 hours.   BNP Recent Labs  Lab 01/23/19 1343  BNP 423.4*     DDimer No results for input(s): DDIMER in the last 168  hours.   Radiology    Ct Abdomen Pelvis Wo Contrast  Result Date: 01/24/2019 CLINICAL DATA:  63 year old female with history of abdominal distension. Epigastric abdominal pain. EXAM: CT ABDOMEN AND PELVIS WITHOUT CONTRAST TECHNIQUE: Multidetector CT imaging of the abdomen and pelvis was performed following the standard protocol without IV contrast. COMPARISON:  CT the abdomen and pelvis 01/02/2018. FINDINGS: Lower chest: Moderate bilateral pleural effusions with areas of atelectasis and/or consolidation in the lower lobes of the lungs bilaterally. Atherosclerotic calcifications in the thoracic aorta as well as the left main, left anterior descending, left circumflex and right coronary arteries. Calcifications of the aortic valve. Cardiomegaly. Hepatobiliary: No suspicious cystic or solid hepatic lesions are confidently identified on today's noncontrast CT examination. Status post cholecystectomy. Pancreas: Fatty atrophy in the pancreas. No discrete pancreatic mass, no definite pancreatic mass or peripancreatic fluid collections or inflammatory changes are noted on today's noncontrast CT examination. Spleen: Unremarkable.  Adrenals/Urinary Tract: Unenhanced appearance of the kidneys and bilateral adrenal glands is unremarkable. No hydroureteronephrosis. Urinary bladder is unremarkable in appearance. Stomach/Bowel: Unenhanced appearance of the stomach is normal. There is no pathologic dilatation of small bowel or colon. Multiple scattered colonic diverticulae are noted, without surrounding inflammatory changes to suggest an acute diverticulitis at this time. Normal appendix. Vascular/Lymphatic: Aortic atherosclerosis. Retroaortic left renal vein (normal anatomical variant) incidentally noted. No lymphadenopathy identified in the abdomen or pelvis. Reproductive: Unenhanced appearance of the uterus and ovaries is unremarkable. Other: Small umbilical hernia containing only omental fat. No significant volume of ascites. No pneumoperitoneum. Musculoskeletal: There are no aggressive appearing lytic or blastic lesions noted in the visualized portions of the skeleton. IMPRESSION: 1. No acute findings are noted in the abdomen or pelvis to account for the patient's symptoms. 2. Moderate bilateral pleural effusions with dependent areas of atelectasis and/or consolidation in the lower lobes of the lungs bilaterally. 3. Colonic diverticulosis without evidence of acute diverticulitis at this time. 4. Small umbilical hernia containing only omental fat. No associated bowel incarceration or obstruction at this time. Electronically Signed   By: Vinnie Langton M.D.   On: 01/24/2019 18:09    Cardiac Studies   Echocardiogram 01/23/2019: Impressions: 1. Left ventricular ejection fraction, by visual estimation, is 40 to 45%. The left ventricle has normal function. There is moderately increased left ventricular hypertrophy. 2. Global hypokinesis, worse apically. 3. Left ventricular diastolic Doppler parameters are consistent with restrictive filling pattern of LV diastolic filling. 4. Elevated left atrial and left ventricular end-diastolic  pressures. 5. Global right ventricle has mildly reduced systolic function.The right ventricular size is normal. No increase in right ventricular wall thickness. 6. Left atrial size was normal. 7. Right atrial size was normal. 8. Moderate pleural effusion in the left lateral region. 9. The mitral valve is abnormal. Mild mitral valve regurgitation. 10. The tricuspid valve is grossly normal. Tricuspid valve regurgitation is mild. 11. The aortic valve is tricuspid Aortic valve regurgitation is trivial by color flow Doppler. Mild aortic valve sclerosis without stenosis. 12. The pulmonic valve was grossly normal. Pulmonic valve regurgitation is trivial by color flow Doppler. 13. Mildly elevated pulmonary artery systolic pressure. 14. The inferior vena cava is normal in size with <50% respiratory variability, suggesting right atrial pressure of 8 mmHg. _______________  Left Heart Catheterization 04/25/2016:  The left ventricular systolic function is normal.  LV end diastolic pressure is normal.  The left ventricular ejection fraction is 50-55% by visual estimate.  Impressions:Ms Summerall has essentially normal coronary arteries and normal LV function.  I'm not sure what explains the discrepancy between the EF seen by 2-D echocardiogram and on left ventriculography. Medical therapy will be recommended. The patient may need further evaluation for syncope including either an event monitor or a loop recorder. The sheath was removed and a TR band was placed on the right wrist to achieve patient hemostasis. The patient left the lab in stable condition  Patient Profile     63 y.o. female with a historyof normal coronary arteries on cath in 2018, possible stress-induced cardiomyopathy with improvement, hypertension, hyperlipidemia, and prior CVAnow admitted with CHF.   Assessment & Plan    1. Acute on chronic combined CHF: -440 cc in last 24 hrs. Weight stable. Creatinine now up to 2.21 from  1.95 yesterday. Hold Lasix today. Continue carvedilol and Bidil (no ACEI/ARB due to CKD). LVEF 40-45% by echo on 01/23/19 (down from 50% in July 2020).  2. HTN: BP borderline. Continue Coreg and Bidil.  3. HLD: Continue statin.  4. Acute on chronic CKD stage 3: Creatinine now up to 2.21 from 1.95 yesterday. Hold Lasix today.     For questions or updates, please contact [place] Please consult [email or web address] for contact info under Cardiology/STEMI.      Signed, Kate Sable, MD  01/26/2019, 8:45 AM

## 2019-01-26 NOTE — Progress Notes (Signed)
Patient having intermittent hallucinations where she sees people standing in her room.  When asked about it patient says she knows she is hallucinating.  Patient is alert and oriented x4 when asked orientation questions.  Pt daughter states this happened once before when her blood sugar was in the 50's.  Blood sugar rechecked with a result of 95.   MD aware.  Lights turned on and patient ambulated and in chair per MD verbal order.  Will continue to monitor closely.

## 2019-01-26 NOTE — Evaluation (Signed)
Physical Therapy Evaluation Patient Details Name: Kara Hanson MRN: [id number] DOB: 18-Jul-1955 Today's Date: 01/26/2019   History of Present Illness  Patient is a 47 female with history of congestive heart failure with action fraction 50% as per echo on 11/01/2018 who presented with shortness of breath, dyspnea on exertion, orthopnea.  Pt was COVID-19 4/9-4/22/20 and COVID-19 PNA 09/24/18. She is on 4 L of oxygen at home 24/7 for her chronic hypoxia.  Clinical Impression  Pt admitted with above diagnosis. P Pt currently with functional limitations due to the deficits listed below (see PT Problem List). Pt will benefit from skilled PT to increase their independence and safety with mobility to allow discharge to the venue listed below.  Pt amb with min/guard with RW and cues for safety with RW while on 4 L/min o2 with sats 89% after gait. Pt reports having much difficulty at home managing the flight of stairs to get to her bedroom and bathroom, due to no bathroom on 1st floor. Recommend HHPT to assess home environment.     Follow Up Recommendations Home health PT    Equipment Recommendations  None recommended by PT    Recommendations for Other Services       Precautions / Restrictions Precautions Precautions: Fall Restrictions Weight Bearing Restrictions: No      Mobility  Bed Mobility Overal bed mobility: Modified Independent             General bed mobility comments: HOB elevated  Transfers Overall transfer level: Needs assistance Equipment used: Rolling walker (2 wheeled) Transfers: Sit to/from Stand Sit to Stand: Min guard         General transfer comment: min/guard for steadying  Ambulation/Gait Ambulation/Gait assistance: Min guard Gait Distance (Feet): 85 Feet Assistive device: Rolling walker (2 wheeled) Gait Pattern/deviations: Step-through pattern     General Gait Details: Amb with o2 intact at 4 L/min with o2 89% at end of gait. Cues for safety with  RW, especially with turning.  Stairs            Wheelchair Mobility    Modified Rankin (Stroke Patients Only)       Balance Overall balance assessment: Needs assistance   Sitting balance-Leahy Scale: Fair       Standing balance-Leahy Scale: Poor Standing balance comment: requires UE support                             Pertinent Vitals/Pain Pain Assessment: No/denies pain    Home Living Family/patient expects to be discharged to:: Private residence Living Arrangements: Children   Type of Home: House Home Access: Stairs to enter Entrance Stairs-Rails: Right Entrance Stairs-Number of Steps: 5 Home Layout: Two level;Bed/bath upstairs Home Equipment: Walker - 2 wheels;Wheelchair - [employer];Shower seat      Prior Function Level of Independence: Independent with assistive device(s)         Comments: I with RW, daughter does housework, cooking     Journalist, newspaper   Dominant Hand: Right    Extremity/Trunk Assessment   Upper Extremity Assessment Upper Extremity Assessment: Overall WFL for tasks assessed    Lower Extremity Assessment Lower Extremity Assessment: Generalized weakness       Communication   Communication: No difficulties  Cognition Arousal/Alertness: Awake/alert Behavior During Therapy: WFL for tasks assessed/performed Overall Cognitive Status: Within Functional Limits for tasks assessed  General Comments: Pt oriented, but at one point she randomly started to unsnap her gown to remove, and when asked why she was doing that she replied she didn't know.      General Comments      Exercises     Assessment/Plan    PT Assessment Patient needs continued PT services  PT Problem List Decreased strength;Decreased activity tolerance;Decreased balance;Decreased mobility;Cardiopulmonary status limiting activity;Decreased safety awareness       PT Treatment Interventions  DME instruction;Gait training;Stair training;Functional mobility training;Neuromuscular re-education;Balance training;Therapeutic exercise;Therapeutic activities;Patient/family education    PT Goals (Current goals can be found in the Care Plan section)  Acute Rehab PT Goals Patient Stated Goal: to move out of her current living situation PT Goal Formulation: With patient Time For Goal Achievement: 02/08/19 Potential to Achieve Goals: Good    Frequency Min 3X/week   Barriers to discharge Inaccessible home environment flight of stairs to bedroom that are very tiring for pt to manage    Co-evaluation               AM-PAC PT "6 Clicks" Mobility  Outcome Measure Help needed turning from your back to your side while in a flat bed without using bedrails?: A Little Help needed moving from lying on your back to sitting on the side of a flat bed without using bedrails?: A Little Help needed moving to and from a bed to a chair (including a wheelchair)?: A Little Help needed standing up from a chair using your arms (e.g., wheelchair or bedside chair)?: A Little Help needed to walk in hospital room?: A Little Help needed climbing 3-5 steps with a railing? : A Lot 6 Click Score: 17    End of Session Equipment Utilized During Treatment: Gait belt;Oxygen Activity Tolerance: Patient tolerated treatment well Patient left: in chair;with call bell/phone within reach;with chair alarm set Nurse Communication: Mobility status PT Visit Diagnosis: Muscle weakness (generalized) (M62.81);Difficulty in walking, not elsewhere classified (R26.2)    Time: 1357-1430 PT Time Calculation (min) (ACUTE ONLY): 33 min   Charges:   PT Evaluation $PT Eval Moderate Complexity: 1 Mod PT Treatments $Gait Training: 8-22 mins        Alinna Siple L. Tamala Julian, [place] Pager [phone] 01/26/2019     Galen Manila 01/26/2019, 4:32 PM

## 2019-01-26 NOTE — Progress Notes (Signed)
PROGRESS NOTE    Kara Hanson  EHM:[id number] DOB: May 24, 1955 DOA: 01/23/2019 PCP: [name], MD   Brief Narrative:  Patient is a 63 female with history of congestive heart failure with action fraction 50% as per echo on 11/01/2018 who presented with shortness of breath, dyspnea on exertion, orthopnea.  She is on 4 L of oxygen at home 24/7 for her chronic hypoxia.  On presentation she was found to be in acute on  chronic CHF.  She had bilateral basal crackles, bilateral lower extremity edema and elevated JVD.  Echocardiogram done here today showed ejection fraction of 40 to 45%.  She was started on IV  Lasix.  Cardiology following.  Patient also complained of persistent nausea, vomiting, abdominal pain, not tolerating diet.  GI consulted.  Plan for EGD tomorrow.  Assessment & Plan:   Active Problems:   Acute on chronic combined systolic and diastolic CHF (congestive heart failure) (HCC)   Controlled type 2 diabetes with neuropathy (HCC)   CHF (congestive heart failure) (HCC)   Epigastric pain   Acute on chronic systolic CHF: Presented with dyspnea on minimal exertion, orthopnea.  Had bilateral lower extremity edema, basal crackles and elevated JVD on presentation.  Elevated BNP. echocardiogram showed decrease ejection fraction to 40 to 45%.  Started on  Lasix 60 mg twice a day.  Continue input and output monitoring, daily weight.  Fluid restriction to less than 1.5 L a day.  Salt restriction to less than 2 g a day.  Cardiology following.  Currently on carvedilol and BiDil. CT abdomen/pelvis done yesterday showed bilateral moderate pleural effusion.  Patient denied thoracentesis. Since creatinine bumped up, we held Lasix.  AKI: Baseline creatinine ranges from 1.3-1.6.  Creatinine went up today.  Diuretics held.  Abdominal discomfort/pain: Patient also complained of persistent nausea, vomiting, abdominal pain, not tolerating diet.  GI consulted.  Plan for EGD tomorrow. CT  abdomen/pelvis  did not show any acute intra-abdominal abnormalities.  Continue Protonix.  Dicyclomine. Follow up lipase  Diabetes type 2: Recent hemoglobin A1c of 7.8.  On insulin at home.  Continue current insulin regimen here.  On Neurontin and Cymbalta for diabetic neuropathy.  Hypertension: Currently blood pressure stable.  Continue current medicines  Hyperlipidemia: Continue better  Gout: On allopurinol  Deconditioning/debility: I have requested for PT evaluation.         DVT prophylaxis:Lovenox Code Status: Full Family Communication: Called daughter on phone on 01/25/19 Disposition Plan: Home after stabilization of CHF, resolution of abdominal pain, waiting for endoscopy.   Consultants: Cardiology consult called  Procedures: Echo  Antimicrobials:  Anti-infectives (From admission, onward)   None      Subjective:  Patient seen and examined the bedside this morning.  Feels better today.  Denies any shortness of breath or cough.  Abdominal pain is also better but she still has some discomfort in the epigastric region and she does not want to eat this morning.  I called GI and plan for EGD tomorrow.  Objective: Vitals:   01/25/19 1415 01/25/19 2117 01/25/19 2315 01/26/19 0607  BP: 121/62 120/60  140/64  Pulse: 68 61  72  Resp: 18 18  18   Temp: 98.2 F (36.8 C) 98.5 F (36.9 C) 98.7 F (37.1 C) 98.2 F (36.8 C)  TempSrc: Oral Oral Oral Oral  SpO2: 97% 100%  99%  Weight:    74.2 kg  Height:        Intake/Output Summary (Last 24 hours) at 01/26/2019 1154 Last data filed  at 01/26/2019 7829 Gross per 24 hour  Intake 120 ml  Output 700 ml  Net -580 ml   Filed Weights   01/24/19 0508 01/25/19 0500 01/26/19 5621  Weight: 75.4 kg 76.1 kg 74.2 kg    Examination:  General exam: Not in distress, obese HEENT:PERRL,Oral mucosa moist, Ear/Nose normal on gross exam Respiratory system: Lungs sound much better today.  I did not hear any crackles.  Good air  entry. Cardiovascular system: S1 & S2 heard, RRR.  Elevated JVD, no murmurs, rubs, gallops or clicks.   Gastrointestinal system: Abdomen is mildly diistended, soft and mild tenderness on the epigastric region. No organomegaly or masses felt. Normal bowel sounds heard. Central nervous system: Alert and oriented. No focal neurological deficits. Extremities: Very little lower extremity edema, no clubbing ,no cyanosis, distal peripheral pulses palpable. Skin: No rashes, lesions or ulcers,no icterus ,no pallor    Data Reviewed: I have personally reviewed following labs and imaging studies  CBC: Recent Labs  Lab 01/23/19 1343 01/24/19 0428  WBC 4.7 4.1  NEUTROABS 3.7  --   HGB 9.5* 9.4*  HCT 31.8* 31.8*  MCV 91.4 91.6  PLT 207 308   Basic Metabolic Panel: Recent Labs  Lab 01/23/19 1343 01/24/19 0428 01/25/19 0418 01/26/19 0507  NA 137 138 136 137  K 4.4 4.4 5.2* 4.8  CL 98 101 99 97*  CO2 29 30 30 30   GLUCOSE 307* 128* 99 105*  BUN 37* 35* 41* 44*  CREATININE 1.74* 1.65* 1.95* 2.21*  CALCIUM 9.1 9.0 8.9 9.2   GFR: Estimated Creatinine Clearance: 22.9 mL/min (A) (by C-G formula based on SCr of 2.21 mg/dL (H)). Liver Function Tests: Recent Labs  Lab 01/23/19 1343 01/24/19 0428  AST 14* 11*  ALT 11 10  ALKPHOS 65 58  BILITOT 0.3 0.4  PROT 6.0* 5.4*  ALBUMIN 2.8* 2.6*   No results for input(s): LIPASE, AMYLASE in the last 168 hours. No results for input(s): AMMONIA in the last 168 hours. Coagulation Profile: No results for input(s): INR, PROTIME in the last 168 hours. Cardiac Enzymes: No results for input(s): CKTOTAL, CKMB, CKMBINDEX, TROPONINI in the last 168 hours. BNP (last 3 results) No results for input(s): PROBNP in the last 8760 hours. HbA1C: No results for input(s): HGBA1C in the last 72 hours. CBG: Recent Labs  Lab 01/25/19 1155 01/25/19 1648 01/25/19 2114 01/26/19 0727 01/26/19 1126  GLUCAP 130* 108* 153* 95 83   Lipid Profile: No results for  input(s): CHOL, HDL, LDLCALC, TRIG, CHOLHDL, LDLDIRECT in the last 72 hours. Thyroid Function Tests: No results for input(s): TSH, T4TOTAL, FREET4, T3FREE, THYROIDAB in the last 72 hours. Anemia Panel: No results for input(s): VITAMINB12, FOLATE, FERRITIN, TIBC, IRON, RETICCTPCT in the last 72 hours. Sepsis Labs: No results for input(s): PROCALCITON, LATICACIDVEN in the last 168 hours.  Recent Results (from the past 240 hour(s))  SARS CORONAVIRUS 2 (TAT 6-24 HRS) Nasopharyngeal Nasopharyngeal Swab     Status: None   Collection Time: 01/23/19  3:55 PM   Specimen: Nasopharyngeal Swab  Result Value Ref Range Status   SARS Coronavirus 2 NEGATIVE NEGATIVE Final    Comment: (NOTE) SARS-CoV-2 target nucleic acids are NOT DETECTED. The SARS-CoV-2 RNA is generally detectable in upper and lower respiratory specimens during the acute phase of infection. Negative results do not preclude SARS-CoV-2 infection, do not rule out co-infections with other pathogens, and should not be used as the sole basis for treatment or other patient management decisions. Negative results must  be combined with clinical observations, patient history, and epidemiological information. The expected result is Negative. Fact Sheet for Patients: SugarRoll.be Fact Sheet for Healthcare Providers: https://www.woods-mathews.com/ This test is not yet approved or cleared by the [place] FDA and  has been authorized for detection and/or diagnosis of SARS-CoV-2 by FDA under an Emergency Use Authorization (EUA). This EUA will remain  in effect (meaning this test can be used) for the duration of the COVID-19 declaration under Section 56 4(b)(1) of the Act, 21 U.S.C. section 360bbb-3(b)(1), unless the authorization is terminated or revoked sooner. Performed at [facility], [place] [address], [place], [place] [address]          Radiology Studies: Ct Abdomen Pelvis Wo  Contrast  Result Date: 01/24/2019 CLINICAL DATA:  62 year old female with history of abdominal distension. Epigastric abdominal pain. EXAM: CT ABDOMEN AND PELVIS WITHOUT CONTRAST TECHNIQUE: Multidetector CT imaging of the abdomen and pelvis was performed following the standard protocol without IV contrast. COMPARISON:  CT the abdomen and pelvis 01/02/2018. FINDINGS: Lower chest: Moderate bilateral pleural effusions with areas of atelectasis and/or consolidation in the lower lobes of the lungs bilaterally. Atherosclerotic calcifications in the thoracic aorta as well as the left main, left anterior descending, left circumflex and right coronary arteries. Calcifications of the aortic valve. Cardiomegaly. Hepatobiliary: No suspicious cystic or solid hepatic lesions are confidently identified on today's noncontrast CT examination. Status post cholecystectomy. Pancreas: Fatty atrophy in the pancreas. No discrete pancreatic mass, no definite pancreatic mass or peripancreatic fluid collections or inflammatory changes are noted on today's noncontrast CT examination. Spleen: Unremarkable. Adrenals/Urinary Tract: Unenhanced appearance of the kidneys and bilateral adrenal glands is unremarkable. No hydroureteronephrosis. Urinary bladder is unremarkable in appearance. Stomach/Bowel: Unenhanced appearance of the stomach is normal. There is no pathologic dilatation of small bowel or colon. Multiple scattered colonic diverticulae are noted, without surrounding inflammatory changes to suggest an acute diverticulitis at this time. Normal appendix. Vascular/Lymphatic: Aortic atherosclerosis. Retroaortic left renal vein (normal anatomical variant) incidentally noted. No lymphadenopathy identified in the abdomen or pelvis. Reproductive: Unenhanced appearance of the uterus and ovaries is unremarkable. Other: Small umbilical hernia containing only omental fat. No significant volume of ascites. No pneumoperitoneum. Musculoskeletal:  There are no aggressive appearing lytic or blastic lesions noted in the visualized portions of the skeleton. IMPRESSION: 1. No acute findings are noted in the abdomen or pelvis to account for the patient's symptoms. 2. Moderate bilateral pleural effusions with dependent areas of atelectasis and/or consolidation in the lower lobes of the lungs bilaterally. 3. Colonic diverticulosis without evidence of acute diverticulitis at this time. 4. Small umbilical hernia containing only omental fat. No associated bowel incarceration or obstruction at this time. Electronically Signed   By: Vinnie Langton M.D.   On: 01/24/2019 18:09        Scheduled Meds:  allopurinol  150 mg Oral Daily   aspirin EC  81 mg Oral Daily   atorvastatin  80 mg Oral q1800   carvedilol  12.5 mg Oral BID WC   dicyclomine  20 mg Oral TID AC & HS   DULoxetine  60 mg Oral Daily   enoxaparin (LOVENOX) injection  30 mg Subcutaneous Q24H   gabapentin  300 mg Oral BID   insulin aspart  0-15 Units Subcutaneous TID WC   insulin aspart  0-5 Units Subcutaneous QHS   insulin glargine  10 Units Subcutaneous QHS   isosorbide-hydrALAZINE  1 tablet Oral TID   pantoprazole (PROTONIX) IV  40 mg  Intravenous Q12H   Continuous Infusions:   LOS: 3 days    Time spent: 25 mins.More than 50% of that time was spent in counseling and/or coordination of care.      Shelly Coss, MD [facility] Pager [phone]  If 7PM-7AM, please contact night-coverage [email or web address] Password [email or web address] 01/26/2019, 11:54 AM

## 2019-01-26 NOTE — Consult Note (Signed)
Referring Provider: [place] Primary Care Physician:  [name], MD Primary Gastroenterologist: Althia Forts  Reason for Consultation: Epigastric pain, nausea and vomiting  HPI: Kara Hanson is a 63 y.o. female with history of cardiomyopathy with ejection fraction of 40 to 45 %  now, history of CVA, history of COVID-19 pneumonia in April 2020 with Negative COVID-19 test on 10/2018 and 01/23/2019 admitted to the hospital on January 23, 2019 with shortness of breath.  She is complaining of epigastric abdominal pain, nausea and vomiting.  GI is consulted for further evaluation.  Patient with history of chronic anemia with hemoglobin in the range of around 9 since earlier this year.  Normal LFTs on admission.  CT abdomen pelvis without contrast on January 24, 2019 showed moderate bilateral pleural effusion, status post cholecystectomy, fatty atrophy of pancreas, negative for any acute changes.  Patient seen and examined at bedside.  She is complaining of intermittent epigastric pain for last 2 days.  Described as epigastric burning sensation associated with nausea and vomiting.  Also complaining of sensation of solid food getting stuck in the esophagus.  Complaining of diarrhea alternating with constipation which is chronic for her.  Denies any blood in the stool or black stool.  Last colonoscopy 5 years ago in [place] was normal.  Not sure of previous EGD.  No family history of colon cancer  Past Medical History:  Diagnosis Date  . CHF (congestive heart failure) ([place])   . CVA (cerebral vascular accident) ([place])   . Diabetes mellitus without complication ([place])   . Hypercholesteremia   . Hypertension   . Myocardial infarction ([place])   . Pneumonia 11/01/2018  . Spinal stenosis     Past Surgical History:  Procedure Laterality Date  . BLADDER SURGERY    . CARDIAC CATHETERIZATION N/A 04/25/2016   Procedure: Left Heart Cath and Coronary Angiography;  Surgeon: Lorretta Harp, MD;  Location: [facility];  Service: Cardiovascular;  Laterality: N/A;  . CESAREAN SECTION    . CHOLECYSTECTOMY      Prior to Admission medications   Medication Sig Start Date End Date Taking? Authorizing Provider  allopurinol (ZYLOPRIM) 300 MG tablet Take 1 tablet (300 mg total) by mouth daily. 01/08/19  Yes [name], MD  amLODipine (NORVASC) 5 MG tablet Take 1 tablet (5 mg total) by mouth at bedtime. 01/08/19 02/07/19 Yes [name], MD  aspirin EC 81 MG tablet Take 81 mg by mouth daily.   Yes [provider]  atorvastatin (LIPITOR) 80 MG tablet Take 1 tablet (80 mg total) by mouth daily at 6 PM. 01/08/19 02/07/19 Yes Newlin, Charlane Ferretti, MD  carvedilol (COREG) 12.5 MG tablet Take 1 tablet (12.5 mg total) by mouth 2 (two) times daily with a meal. 01/08/19  Yes Newlin, Enobong, MD  DULoxetine (CYMBALTA) 60 MG capsule Take 1 capsule (60 mg total) by mouth daily. For chronic back and neck pain Patient taking differently: Take 60 mg by mouth daily as needed. For chronic back and neck pain 01/08/19  Yes Newlin, Charlane Ferretti, MD  furosemide (LASIX) 40 MG tablet Take 1 tablet (40 mg total) by mouth daily. Patient taking differently: Take 40 mg by mouth 2 (two) times daily.  01/08/19  Yes [name], MD  gabapentin (NEURONTIN) 300 MG capsule Take 1 capsule (300 mg total) by mouth 2 (two) times daily. 01/08/19 02/07/19 Yes Newlin, Charlane Ferretti, MD  insulin aspart (NOVOLOG) 100 UNIT/ML injection 0 to 12 units subcutaneously 3 times daily before meals as per sliding  scale 03/29/18  Yes [name], MD  Insulin Glargine (BASAGLAR KWIKPEN) 100 UNIT/ML SOPN Inject 0.05 mLs (5 Units total) into the skin at bedtime. Patient taking differently: Inject 35 Units into the skin at bedtime.  10/02/18  Yes Bonnielee Haff, MD  lansoprazole (PREVACID) 15 MG capsule Take 15 mg by mouth daily.  10/02/18  Yes [provider]  omeprazole (PRILOSEC) 20 MG capsule Take 20 mg by mouth daily.   Yes [provider]  polyethylene glycol (MIRALAX / GLYCOLAX) 17 g packet Take 17 g by mouth daily. Patient taking differently: Take 17 g by mouth daily as needed for moderate constipation.  10/02/18  Yes Bonnielee Haff, MD  SUPER B COMPLEX/C CAPS Take 1 capsule by mouth daily.   Yes [provider]  Accu-Chek FastClix Lancets MISC USE AS DIRECTED TO TEST BLOOD SUGAR THREE TIMES DAILY 07/31/18   [name], MD  Blood Glucose Monitoring Suppl (ACCU-CHEK GUIDE) w/Device KIT 1 each by Does not apply route 3 (three) times daily. 07/31/18   [name], MD  EASY COMFORT PEN NEEDLES 31G X 5 MM MISC USE FOUR TIMES PER DAY FOR INSULIN ADMINISTRATION Patient taking differently: 4 (four) times daily.  10/02/18   [name], MD  ergocalciferol (DRISDOL) 1.25 MG (50000 UT) capsule Take 1 capsule (50,000 Units total) by mouth once a week. 09/18/18   [name], MD  glucose blood (ACCU-CHEK GUIDE) test strip USE AS DIRECTED TO TEST BLOOD SUGAR THREE TIMES DAILY 07/31/18   [name], MD  guaiFENesin-dextromethorphan (ROBITUSSIN DM) 100-10 MG/5ML syrup Take 10 mLs by mouth every 6 (six) hours. Patient not taking: Reported on 01/08/2019 11/05/18   Deatra James, MD  lactobacillus acidophilus & bulgar (LACTINEX) chewable tablet Chew 1 tablet by mouth 3 (three) times daily with meals. Patient not taking: Reported on 01/23/2019 11/05/18   Deatra James, MD  Lancet Device MISC Use as instructed 3 times daily 09/19/17   Rodell Perna A, PA-C  methylPREDNISolone (MEDROL) 4 MG tablet Take 1 tablet (4 mg total) by mouth daily. Take medication as directed per dose pack Patient not taking: Reported on 01/08/2019 11/05/18   Deatra James, MD  Misc. Devices MISC Portable oxygen concentrator.  Diagnosis-chronic respiratory failure. 09/03/18   [name], MD  Misc. Devices MISC [place] with seat. Dx: Congestive Heart Failure 10/19/18   [name], MD  ondansetron (ZOFRAN) 4 MG tablet Take 1 tablet (4 mg  total) by mouth every 6 (six) hours as needed for nausea. 07/25/18   Charlynne Cousins, MD    Scheduled Meds: . allopurinol  150 mg Oral Daily  . aspirin EC  81 mg Oral Daily  . atorvastatin  80 mg Oral q1800  . carvedilol  12.5 mg Oral BID WC  . dicyclomine  20 mg Oral TID AC & HS  . DULoxetine  60 mg Oral Daily  . enoxaparin (LOVENOX) injection  30 mg Subcutaneous Q24H  . gabapentin  300 mg Oral BID  . insulin aspart  0-15 Units Subcutaneous TID WC  . insulin aspart  0-5 Units Subcutaneous QHS  . insulin glargine  10 Units Subcutaneous QHS  . isosorbide-hydrALAZINE  1 tablet Oral TID  . pantoprazole  40 mg Oral Daily   Continuous Infusions: PRN Meds:.ondansetron (ZOFRAN) IV  Allergies as of 01/23/2019 - Review Complete 01/23/2019  Allergen Reaction Noted  . Garlic Shortness Of Breath, Itching, and Swelling 06/17/2016  . Latex Itching 10/01/2015  . Morphine and related Itching  and Other (See Comments) 10/01/2015  . Other Itching 09/30/2016    Family History  Problem Relation Age of Onset  . Diabetes Mellitus II Father   . Stroke Father   . Healthy Mother        She is 16 years old.     Social History   Socioeconomic History  . Marital status: Widowed    Spouse name: Not on file  . Number of children: Not on file  . Years of education: Not on file  . Highest education level: Not on file  Occupational History  . Not on file  Social Needs  . Financial resource strain: Not on file  . Food insecurity    Worry: Not on file    Inability: Not on file  . Transportation needs    Medical: Not on file    Non-medical: Not on file  Tobacco Use  . Smoking status: Never Smoker  . Smokeless tobacco: Never Used  Substance and Sexual Activity  . Alcohol use: No  . Drug use: No  . Sexual activity: Not Currently    Birth control/protection: None  Lifestyle  . Physical activity    Days per week: Not on file    Minutes per session: Not on file  . Stress: Not on file   Relationships  . Social Herbalist on phone: Not on file    Gets together: Not on file    Attends religious service: Not on file    Active member of club or organization: Not on file    Attends meetings of clubs or organizations: Not on file    Relationship status: Not on file  . Intimate partner violence    Fear of current or ex partner: Not on file    Emotionally abused: Not on file    Physically abused: Not on file    Forced sexual activity: Not on file  Other Topics Concern  . Not on file  Social History Narrative  . Not on file    Review of Systems: Review of Systems  Constitutional: Negative for chills and fever.  HENT: Negative for hearing loss and tinnitus.   Eyes: Negative for blurred vision and double vision.  Respiratory: Positive for shortness of breath. Negative for cough.   Cardiovascular: Negative for chest pain and palpitations.  Gastrointestinal: Positive for abdominal pain, constipation, diarrhea, heartburn, nausea and vomiting. Negative for blood in stool and melena.  Genitourinary: Negative for dysuria and urgency.  Musculoskeletal: Positive for back pain and joint pain.  Neurological: Negative for seizures and loss of consciousness.  Endo/Heme/Allergies: Does not bruise/bleed easily.  Psychiatric/Behavioral: Negative for memory loss. The patient is nervous/anxious.     Physical Exam: Vital signs: Vitals:   01/25/19 2315 01/26/19 0607  BP:  140/64  Pulse:  72  Resp:  18  Temp: 98.7 F (37.1 C) 98.2 F (36.8 C)  SpO2:  99%   Last BM Date: 01/24/19 Physical Exam  Constitutional: She is oriented to person, place, and time. She appears well-developed and well-nourished. No distress.  HENT:  Head: Normocephalic and atraumatic.  Eyes: EOM are normal. No scleral icterus.  Neck: Normal range of motion. Neck supple.  Cardiovascular: Normal rate and regular rhythm.  Pulmonary/Chest: Effort normal. No respiratory distress. She has rales.   Anterior exam only  Abdominal: Soft. Bowel sounds are normal. She exhibits no distension. There is abdominal tenderness. There is no rebound and no guarding.  Mild epigastric tenderness to palpation  Musculoskeletal: Normal range of motion.        General: No edema.  Neurological: She is alert and oriented to person, place, and time.  Skin: Skin is warm. No erythema.  Psychiatric: She has a normal mood and affect. Judgment and thought content normal.  Vitals reviewed.   GI:  Lab Results: Recent Labs    01/23/19 1343 01/24/19 0428  WBC 4.7 4.1  HGB 9.5* 9.4*  HCT 31.8* 31.8*  PLT 207 215   BMET Recent Labs    01/24/19 0428 01/25/19 0418 01/26/19 0507  NA 138 136 137  K 4.4 5.2* 4.8  CL 101 99 97*  CO2 30 30 30   GLUCOSE 128* 99 105*  BUN 35* 41* 44*  CREATININE 1.65* 1.95* 2.21*  CALCIUM 9.0 8.9 9.2   LFT Recent Labs    01/24/19 0428  PROT 5.4*  ALBUMIN 2.6*  AST 11*  ALT 10  ALKPHOS 58  BILITOT 0.4   PT/INR No results for input(s): LABPROT, INR in the last 72 hours.   Studies/Results: Ct Abdomen Pelvis Wo Contrast  Result Date: 01/24/2019 CLINICAL DATA:  63 year old female with history of abdominal distension. Epigastric abdominal pain. EXAM: CT ABDOMEN AND PELVIS WITHOUT CONTRAST TECHNIQUE: Multidetector CT imaging of the abdomen and pelvis was performed following the standard protocol without IV contrast. COMPARISON:  CT the abdomen and pelvis 01/02/2018. FINDINGS: Lower chest: Moderate bilateral pleural effusions with areas of atelectasis and/or consolidation in the lower lobes of the lungs bilaterally. Atherosclerotic calcifications in the thoracic aorta as well as the left main, left anterior descending, left circumflex and right coronary arteries. Calcifications of the aortic valve. Cardiomegaly. Hepatobiliary: No suspicious cystic or solid hepatic lesions are confidently identified on today's noncontrast CT examination. Status post cholecystectomy.  Pancreas: Fatty atrophy in the pancreas. No discrete pancreatic mass, no definite pancreatic mass or peripancreatic fluid collections or inflammatory changes are noted on today's noncontrast CT examination. Spleen: Unremarkable. Adrenals/Urinary Tract: Unenhanced appearance of the kidneys and bilateral adrenal glands is unremarkable. No hydroureteronephrosis. Urinary bladder is unremarkable in appearance. Stomach/Bowel: Unenhanced appearance of the stomach is normal. There is no pathologic dilatation of small bowel or colon. Multiple scattered colonic diverticulae are noted, without surrounding inflammatory changes to suggest an acute diverticulitis at this time. Normal appendix. Vascular/Lymphatic: Aortic atherosclerosis. Retroaortic left renal vein (normal anatomical variant) incidentally noted. No lymphadenopathy identified in the abdomen or pelvis. Reproductive: Unenhanced appearance of the uterus and ovaries is unremarkable. Other: Small umbilical hernia containing only omental fat. No significant volume of ascites. No pneumoperitoneum. Musculoskeletal: There are no aggressive appearing lytic or blastic lesions noted in the visualized portions of the skeleton. IMPRESSION: 1. No acute findings are noted in the abdomen or pelvis to account for the patient's symptoms. 2. Moderate bilateral pleural effusions with dependent areas of atelectasis and/or consolidation in the lower lobes of the lungs bilaterally. 3. Colonic diverticulosis without evidence of acute diverticulitis at this time. 4. Small umbilical hernia containing only omental fat. No associated bowel incarceration or obstruction at this time. Electronically Signed   By: Vinnie Langton M.D.   On: 01/24/2019 18:09    Impression/Plan: -Epigastric burning sensation/epigastric abdominal pain.  Normal LFTs.  CT scan showed fatty atrophy of pancreas otherwise no acute changes. ??  Gastritis vs esophagitis vs ulcer disease.  Less likelihood of pancreatitis  but remains possible. -Esophageal dysphagia -CHF/cardiomyopathy with EF of 40 to 45% - history of COVID-19 pneumonia in April 2020 with Negative COVID-19 test on 10/2018  and 01/23/2019  -Chronic anemia  Recommendations ------------------------- -Check for lipase. -Change p.o. Protonix to IV 40 mg twice daily -Plan for EGD tomorrow.  N.p.o. past midnight  Risks (bleeding, infection, bowel perforation that could require surgery, sedation-related changes in cardiopulmonary systems), benefits (identification and possible treatment of source of symptoms, exclusion of certain causes of symptoms), and alternatives (watchful waiting, radiographic imaging studies, empiric medical treatment)  were explained to patient in detail and patient wishes to proceed.    LOS: 3 days   Otis Brace  MD, FACP 01/26/2019, 9:33 AM  Contact #  [phone]

## 2019-01-27 ENCOUNTER — Inpatient Hospital Stay (HOSPITAL_COMMUNITY): Payer: Medicare Other | Admitting: Certified Registered Nurse Anesthetist

## 2019-01-27 ENCOUNTER — Encounter (HOSPITAL_COMMUNITY): Payer: Self-pay | Admitting: Emergency Medicine

## 2019-01-27 ENCOUNTER — Encounter (HOSPITAL_COMMUNITY)
Admission: EM | Disposition: A | Discharge status: Home Health | Payer: Self-pay | Source: Home / Self Care | Attending: Internal Medicine

## 2019-01-27 DIAGNOSIS — E785 Hyperlipidemia, unspecified: Secondary | ICD-10-CM | POA: Diagnosis not present

## 2019-01-27 DIAGNOSIS — E1140 Type 2 diabetes mellitus with diabetic neuropathy, unspecified: Secondary | ICD-10-CM

## 2019-01-27 DIAGNOSIS — N179 Acute kidney failure, unspecified: Secondary | ICD-10-CM

## 2019-01-27 DIAGNOSIS — I10 Essential (primary) hypertension: Secondary | ICD-10-CM | POA: Diagnosis not present

## 2019-01-27 DIAGNOSIS — I5043 Acute on chronic combined systolic (congestive) and diastolic (congestive) heart failure: Secondary | ICD-10-CM | POA: Diagnosis not present

## 2019-01-27 HISTORY — PX: ESOPHAGOGASTRODUODENOSCOPY (EGD) WITH PROPOFOL: SHX5813

## 2019-01-27 HISTORY — PX: BIOPSY: SHX5522

## 2019-01-27 LAB — CBC WITH DIFFERENTIAL/PLATELET
Abs Immature Granulocytes: 0.02 10*3/uL (ref 0.00–0.07)
Basophils Absolute: 0 10*3/uL (ref 0.0–0.1)
Basophils Relative: 0 %
Eosinophils Absolute: 0.1 10*3/uL (ref 0.0–0.5)
Eosinophils Relative: 1 %
HCT: 29.1 % — ABNORMAL LOW (ref 36.0–46.0)
Hemoglobin: 8.7 g/dL — ABNORMAL LOW (ref 12.0–15.0)
Immature Granulocytes: 0 %
Lymphocytes Relative: 8 %
Lymphs Abs: 0.4 10*3/uL — ABNORMAL LOW (ref 0.7–4.0)
MCH: 27.3 pg (ref 26.0–34.0)
MCHC: 29.9 g/dL — ABNORMAL LOW (ref 30.0–36.0)
MCV: 91.2 fL (ref 80.0–100.0)
Monocytes Absolute: 0.4 10*3/uL (ref 0.1–1.0)
Monocytes Relative: 9 %
Neutro Abs: 4 10*3/uL (ref 1.7–7.7)
Neutrophils Relative %: 82 %
Platelets: 199 10*3/uL (ref 150–400)
RBC: 3.19 MIL/uL — ABNORMAL LOW (ref 3.87–5.11)
RDW: 14.8 % (ref 11.5–15.5)
WBC: 5 10*3/uL (ref 4.0–10.5)
nRBC: 0 % (ref 0.0–0.2)

## 2019-01-27 LAB — BASIC METABOLIC PANEL
Anion gap: 12 (ref 5–15)
BUN: 46 mg/dL — ABNORMAL HIGH (ref 8–23)
CO2: 26 mmol/L (ref 22–32)
Calcium: 9.2 mg/dL (ref 8.9–10.3)
Chloride: 97 mmol/L — ABNORMAL LOW (ref 98–111)
Creatinine, Ser: 2.4 mg/dL — ABNORMAL HIGH (ref 0.44–1.00)
GFR calc Af Amer: 24 mL/min — ABNORMAL LOW (ref >60)
GFR calc non Af Amer: 21 mL/min — ABNORMAL LOW (ref >60)
Glucose, Bld: 92 mg/dL (ref 70–99)
Potassium: 4.6 mmol/L (ref 3.5–5.1)
Sodium: 135 mmol/L (ref 135–145)

## 2019-01-27 LAB — GLUCOSE, CAPILLARY
Glucose-Capillary: 86 mg/dL (ref 70–99)
Glucose-Capillary: 85 mg/dL (ref 70–99)
Glucose-Capillary: 126 mg/dL — ABNORMAL HIGH (ref 70–99)
Glucose-Capillary: 131 mg/dL — ABNORMAL HIGH (ref 70–99)
Glucose-Capillary: 101 mg/dL — ABNORMAL HIGH (ref 70–99)

## 2019-01-27 SURGERY — ESOPHAGOGASTRODUODENOSCOPY (EGD) WITH PROPOFOL
Anesthesia: Monitor Anesthesia Care

## 2019-01-27 MED ORDER — PROPOFOL 10 MG/ML IV BOLUS
INTRAVENOUS | Status: AC
  Filled 2019-01-27: qty 20

## 2019-01-27 MED ORDER — KETAMINE HCL 10 MG/ML IJ SOLN
INTRAMUSCULAR | Status: AC
  Filled 2019-01-27: qty 1

## 2019-01-27 MED ORDER — PROPOFOL 10 MG/ML IV BOLUS
INTRAVENOUS | Status: AC
  Filled 2019-01-27: qty 40

## 2019-01-27 MED ORDER — KETOROLAC TROMETHAMINE 15 MG/ML IJ SOLN
15.0000 mg | Freq: Once | INTRAMUSCULAR | Status: AC
  Administered 2019-01-27: 15 mg via INTRAVENOUS
  Filled 2019-01-27: qty 1

## 2019-01-27 MED ORDER — PROPOFOL 500 MG/50ML IV EMUL
INTRAVENOUS | Status: DC | PRN
  Administered 2019-01-27: 150 ug/kg/min via INTRAVENOUS

## 2019-01-27 MED ORDER — KETAMINE HCL 10 MG/ML IJ SOLN
INTRAMUSCULAR | Status: DC | PRN
  Administered 2019-01-27: 30 mg via INTRAVENOUS

## 2019-01-27 SURGICAL SUPPLY — 14 items

## 2019-01-27 NOTE — Progress Notes (Signed)
PROGRESS NOTE    Joice Nazario  MWN:[id number] DOB: 03-27-1956 DOA: 01/23/2019 PCP: [name], MD   Brief Narrative:  Patient is a 14 female with history of congestive heart failure with action fraction 50% as per echo on 11/01/2018 who presented with shortness of breath, dyspnea on exertion, orthopnea.  She is on 4 L of oxygen at home 24/7 for her chronic hypoxia.  On presentation she was found to be in acute on  chronic CHF.  She had bilateral basal crackles, bilateral lower extremity edema and elevated JVD.  Echocardiogram done here today showed ejection fraction of 40 to 45%.  She was started on IV  Lasix.  Cardiology following.  Patient also complained of persistent nausea, vomiting, abdominal pain, not tolerating diet.  GI consulted.  Plan for EGD tomorrow.    Assessment & Plan:   Active Problems:   Hyperlipidemia LDL goal <70   Acute on chronic combined systolic and diastolic CHF (congestive heart failure) (HCC)   Controlled type 2 diabetes with neuropathy (HCC)   CHF (congestive heart failure) (HCC)   Epigastric pain   Acute renal failure superimposed on stage 3 chronic kidney disease (HCC)   Acute on chronic systolic CHF:  As previously noted, presented with dyspnea on minimal exertion, orthopnea.  Had bilateral lower extremity edema, basal crackles and elevated JVD on presentation.  Elevated BNP. echocardiogram showed decrease ejection fraction to 40 to 45%.  Started on  Lasix 60 mg twice a day.  Continue input and output monitoring, daily weight.  Fluid restriction to less than 1.5 L a day.  Salt restriction to less than 2 g a day.  Cardiology following.  Currently on carvedilol and BiDil. CT abdomen/pelvis done yesterday showed bilateral moderate pleural effusion.  Patient denied thoracentesis. Since creatinine bumped up, we held Lasix.  AKI: Baseline creatinine ranges from 1.3-1.6.  Creatinine went up 10/24 AS WELL AS TODAY.  Diuretics d/c'd  Abdominal  discomfort/pain with erosive gastritis: Patient complained of persistent nausea, vomiting, abdominal pain, not tolerating diet.  GI consulted.    EGD showed erosive gastritis without acute bleeding ulcer CT abdomen/pelvis  did not show any acute intra-abdominal abnormalities.  Continue Protonix although currently 40 IV twice daily started today patient's abdominal pain improves anticipate discharge home tomorrow on p.o.   Diabetes type 2: Recent hemoglobin A1c of 7.8.  On insulin at home.  Continue current insulin regimen here.  On Neurontin and Cymbalta for diabetic neuropathy.  Hypertension: Currently blood pressure stable.  Continue current medicines  Hyperlipidemia: Continue better  Gout: On allopurinol  Deconditioning/debility: I have requested for PT evaluation.  DVT prophylaxis: Lovenox SQ  Code Status: Full code    Code Status Orders  (From admission, onward)         Start     Ordered   01/23/19 1757  Full code  Continuous     01/23/19 1757        Code Status History    Date Active Date Inactive Code Status Order ID Comments User Context   11/01/2018 0029 11/05/2018 2014 Full Code [id number]  Sid Falcon, MD ED   09/24/2018 2303 10/02/2018 1600 Full Code [id number]  Elwyn Reach, MD Inpatient   07/20/2018 1655 07/26/2018 1800 Full Code [id number]  Debbe Odea, MD ED   03/20/2018 0402 03/24/2018 1949 Full Code [id number]  Norval Morton, MD ED   11/27/2017 0705 11/30/2017 1625 Full Code [id number]  Radene Gunning, NP ED   10/01/2016 [phone] 10/01/2016  1946 Full Code [id number]  Maryellen Pile, MD ED   06/17/2016 1720 06/18/2016 2022 Full Code [id number]  Reyne Dumas, MD ED   04/23/2016 0418 04/26/2016 1716 Full Code [id number]  Rise Patience, MD ED   Advance Care Planning Activity     Family Communication: Discussed in detail with patient today, left message on daughter cell phone Disposition Plan:   Patient remained inpatient for IV PPI post Eiad EGD procedure,  monitoring of labs, if abdominal pain and labs are stable anticipate discharge home tomorrow.  Patient currently not medically stable for discharge today Consults called: None Admission status: Inpatient   Consultants:   gi  Procedures:  Ct Abdomen Pelvis Wo Contrast  Result Date: 01/24/2019 CLINICAL DATA:  64 year old female with history of abdominal distension. Epigastric abdominal pain. EXAM: CT ABDOMEN AND PELVIS WITHOUT CONTRAST TECHNIQUE: Multidetector CT imaging of the abdomen and pelvis was performed following the standard protocol without IV contrast. COMPARISON:  CT the abdomen and pelvis 01/02/2018. FINDINGS: Lower chest: Moderate bilateral pleural effusions with areas of atelectasis and/or consolidation in the lower lobes of the lungs bilaterally. Atherosclerotic calcifications in the thoracic aorta as well as the left main, left anterior descending, left circumflex and right coronary arteries. Calcifications of the aortic valve. Cardiomegaly. Hepatobiliary: No suspicious cystic or solid hepatic lesions are confidently identified on today's noncontrast CT examination. Status post cholecystectomy. Pancreas: Fatty atrophy in the pancreas. No discrete pancreatic mass, no definite pancreatic mass or peripancreatic fluid collections or inflammatory changes are noted on today's noncontrast CT examination. Spleen: Unremarkable. Adrenals/Urinary Tract: Unenhanced appearance of the kidneys and bilateral adrenal glands is unremarkable. No hydroureteronephrosis. Urinary bladder is unremarkable in appearance. Stomach/Bowel: Unenhanced appearance of the stomach is normal. There is no pathologic dilatation of small bowel or colon. Multiple scattered colonic diverticulae are noted, without surrounding inflammatory changes to suggest an acute diverticulitis at this time. Normal appendix. Vascular/Lymphatic: Aortic atherosclerosis. Retroaortic left renal vein (normal anatomical variant) incidentally noted.  No lymphadenopathy identified in the abdomen or pelvis. Reproductive: Unenhanced appearance of the uterus and ovaries is unremarkable. Other: Small umbilical hernia containing only omental fat. No significant volume of ascites. No pneumoperitoneum. Musculoskeletal: There are no aggressive appearing lytic or blastic lesions noted in the visualized portions of the skeleton. IMPRESSION: 1. No acute findings are noted in the abdomen or pelvis to account for the patient's symptoms. 2. Moderate bilateral pleural effusions with dependent areas of atelectasis and/or consolidation in the lower lobes of the lungs bilaterally. 3. Colonic diverticulosis without evidence of acute diverticulitis at this time. 4. Small umbilical hernia containing only omental fat. No associated bowel incarceration or obstruction at this time. Electronically Signed   By: Vinnie Langton M.D.   On: 01/24/2019 18:09   Dg Chest Port 1 View  Result Date: 01/23/2019 CLINICAL DATA:  63 year old female with shortness of breath. On home oxygen. EXAM: PORTABLE CHEST 1 VIEW COMPARISON:  10/31/2018 and earlier. FINDINGS: Portable AP semi upright view at 1509 hours. Stable lung volumes and mediastinal contours. Visualized tracheal air column is within normal limits. Further increased bilateral interstitial and lung base opacity, with obscuration of the diaphragm now. No superimposed pneumothorax. No definite air bronchograms. Paucity bowel gas in the upper abdomen. No acute osseous abnormality identified. IMPRESSION: Increasing bilateral lung opacity favored due to acute pulmonary edema with lung base atelectasis and possibly small pleural effusions. Bilateral lower lobe pneumonia felt less likely. Electronically Signed   By: Genevie Ann M.D.   On: 01/23/2019 15:18  Antimicrobials:   none   Subjective: No acute complaints, still mildly groggy post procedure Stable overnight  Objective: Vitals:   01/27/19 0907 01/27/19 1000 01/27/19 1010  01/27/19 1020  BP: (!) 152/62 (!) 160/61 (!) 173/64 (!) 156/48  Pulse: 78 75 75 78  Resp: 18 15 14 18   Temp: 98.3 F (36.8 C) 98.3 F (36.8 C)    TempSrc: Oral Oral    SpO2: 100% 97% 98% 91%  Weight:      Height:        Intake/Output Summary (Last 24 hours) at 01/27/2019 1234 Last data filed at 01/27/2019 1111 Gross per 24 hour  Intake 597.74 ml  Output 400 ml  Net 197.74 ml   Filed Weights   01/25/19 0500 01/26/19 0607 01/27/19 9833  Weight: 76.1 kg 74.2 kg 75.2 kg    Examination:  General exam: Appears calm and comfortable  Respiratory system: Clear to auscultation. Respiratory effort normal. Cardiovascular system: S1 & S2 heard, RRR. No JVD, murmurs, rubs, gallops or clicks. No pedal edema. Gastrointestinal system: Abdomen is nondistended, soft and nontender. No organomegaly or masses felt. Normal bowel sounds heard. Central nervous system: Alert mildly groggy from anesthesia post procedure no focal neurological deficits. Extremities: Does move all 4 extremities freely, no focal neurological deficits noted skin: No rashes, lesions or ulcers Psychiatry: Judgement and insight appear normal. Mood & affect appropriate given anes as above     Data Reviewed: I have personally reviewed following labs and imaging studies  CBC: Recent Labs  Lab 01/23/19 1343 01/24/19 0428 01/27/19 0440  WBC 4.7 4.1 5.0  NEUTROABS 3.7  --  4.0  HGB 9.5* 9.4* 8.7*  HCT 31.8* 31.8* 29.1*  MCV 91.4 91.6 91.2  PLT 207 215 825   Basic Metabolic Panel: Recent Labs  Lab 01/23/19 1343 01/24/19 0428 01/25/19 0418 01/26/19 0507 01/27/19 0440  NA 137 138 136 137 135  K 4.4 4.4 5.2* 4.8 4.6  CL 98 101 99 97* 97*  CO2 29 30 30 30 26   GLUCOSE 307* 128* 99 105* 92  BUN 37* 35* 41* 44* 46*  CREATININE 1.74* 1.65* 1.95* 2.21* 2.40*  CALCIUM 9.1 9.0 8.9 9.2 9.2   GFR: Estimated Creatinine Clearance: 21.2 mL/min (A) (by C-G formula based on SCr of 2.4 mg/dL (H)). Liver Function  Tests: Recent Labs  Lab 01/23/19 1343 01/24/19 0428  AST 14* 11*  ALT 11 10  ALKPHOS 65 58  BILITOT 0.3 0.4  PROT 6.0* 5.4*  ALBUMIN 2.8* 2.6*   Recent Labs  Lab 01/26/19 1209  LIPASE 20   No results for input(s): AMMONIA in the last 168 hours. Coagulation Profile: No results for input(s): INR, PROTIME in the last 168 hours. Cardiac Enzymes: No results for input(s): CKTOTAL, CKMB, CKMBINDEX, TROPONINI in the last 168 hours. BNP (last 3 results) No results for input(s): PROBNP in the last 8760 hours. HbA1C: No results for input(s): HGBA1C in the last 72 hours. CBG: Recent Labs  Lab 01/26/19 1515 01/26/19 1637 01/26/19 2129 01/27/19 0759 01/27/19 1147  GLUCAP 95 104* 110* 86 85   Lipid Profile: No results for input(s): CHOL, HDL, LDLCALC, TRIG, CHOLHDL, LDLDIRECT in the last 72 hours. Thyroid Function Tests: No results for input(s): TSH, T4TOTAL, FREET4, T3FREE, THYROIDAB in the last 72 hours. Anemia Panel: No results for input(s): VITAMINB12, FOLATE, FERRITIN, TIBC, IRON, RETICCTPCT in the last 72 hours. Sepsis Labs: No results for input(s): PROCALCITON, LATICACIDVEN in the last 168 hours.  Recent Results (from the  past 240 hour(s))  SARS CORONAVIRUS 2 (TAT 6-24 HRS) Nasopharyngeal Nasopharyngeal Swab     Status: None   Collection Time: 01/23/19  3:55 PM   Specimen: Nasopharyngeal Swab  Result Value Ref Range Status   SARS Coronavirus 2 NEGATIVE NEGATIVE Final    Comment: (NOTE) SARS-CoV-2 target nucleic acids are NOT DETECTED. The SARS-CoV-2 RNA is generally detectable in upper and lower respiratory specimens during the acute phase of infection. Negative results do not preclude SARS-CoV-2 infection, do not rule out co-infections with other pathogens, and should not be used as the sole basis for treatment or other patient management decisions. Negative results must be combined with clinical observations, patient history, and epidemiological information. The  expected result is Negative. Fact Sheet for Patients: SugarRoll.be Fact Sheet for Healthcare Providers: https://www.woods-mathews.com/ This test is not yet approved or cleared by the [place] FDA and  has been authorized for detection and/or diagnosis of SARS-CoV-2 by FDA under an Emergency Use Authorization (EUA). This EUA will remain  in effect (meaning this test can be used) for the duration of the COVID-19 declaration under Section 56 4(b)(1) of the Act, 21 U.S.C. section 360bbb-3(b)(1), unless the authorization is terminated or revoked sooner. Performed at [facility], [place] [address], [place], [place] [address]          Radiology Studies: No results found.      Scheduled Meds:  allopurinol  150 mg Oral Daily   aspirin EC  81 mg Oral Daily   atorvastatin  80 mg Oral q1800   carvedilol  12.5 mg Oral BID WC   DULoxetine  60 mg Oral Daily   enoxaparin (LOVENOX) injection  30 mg Subcutaneous Q24H   gabapentin  300 mg Oral BID   insulin aspart  0-15 Units Subcutaneous TID WC   insulin aspart  0-5 Units Subcutaneous QHS   insulin glargine  10 Units Subcutaneous QHS   isosorbide-hydrALAZINE  1 tablet Oral TID   pantoprazole (PROTONIX) IV  40 mg Intravenous Q12H   Continuous Infusions:   LOS: 4 days    Time spent: 35 min    Nicolette Bang, MD [facility]  If 7PM-7AM, please contact night-coverage  01/27/2019, 12:34 PM

## 2019-01-27 NOTE — Anesthesia Postprocedure Evaluation (Signed)
Anesthesia Post Note  Patient: Kara Hanson  Procedure(s) Performed: ESOPHAGOGASTRODUODENOSCOPY (EGD) WITH PROPOFOL (N/A ) BIOPSY     Patient location during evaluation: Endoscopy Anesthesia Type: MAC Level of consciousness: awake and alert Pain management: pain level controlled Vital Signs Assessment: post-procedure vital signs reviewed and stable Respiratory status: spontaneous breathing, nonlabored ventilation, respiratory function stable and patient connected to nasal cannula oxygen Cardiovascular status: stable and blood pressure returned to baseline Postop Assessment: no apparent nausea or vomiting Anesthetic complications: no    Last Vitals:  Vitals:   01/27/19 1010 01/27/19 1020  BP: (!) 173/64 (!) 156/48  Pulse: 75 78  Resp: 14 18  Temp:    SpO2: 98% 91%    Last Pain:  Vitals:   01/27/19 1020  TempSrc:   PainSc: 0-No pain                 Montez Hageman

## 2019-01-27 NOTE — Op Note (Signed)
[facility] Patient Name: Kara Hanson Procedure Date: 01/27/2019 MRN: [id number] Attending MD: Otis Brace , MD Date of Birth: [birth year] CSN: [id number] Age: 63 Admit Type: Inpatient Procedure:                Upper GI endoscopy Indications:              Epigastric abdominal pain, Dysphagia Providers:                Otis Brace, MD, Ashley Jacobs, RN, Cherylynn Ridges, Technician, Herbie Drape, CRNA Referring MD:              Medicines:                Sedation Administered by an Anesthesia Professional Complications:            Hypoxia, Treated with supplemental oxygen Estimated Blood Loss:     Estimated blood loss was minimal. Procedure:                Pre-Anesthesia Assessment:                           - Prior to the procedure, a History and Physical                            was performed, and patient medications and                            allergies were reviewed. The patient's tolerance of                            previous anesthesia was also reviewed. The risks                            and benefits of the procedure and the sedation                            options and risks were discussed with the patient.                            All questions were answered, and informed consent                            was obtained. Prior Anticoagulants: The patient has                            taken no previous anticoagulant or antiplatelet                            agents. ASA Grade Assessment: III - A patient with                            severe systemic disease. After reviewing the risks  and benefits, the patient was deemed in                            satisfactory condition to undergo the procedure.                           After obtaining informed consent, the endoscope was                            passed under direct vision. Throughout the                            procedure, the  patient's blood pressure, pulse, and                            oxygen saturations were monitored continuously. The                            GIF-H190 ([id number] Olympus gastroscope was                            introduced through the mouth, and advanced to the                            second part of duodenum. The upper GI endoscopy was                            technically difficult and complex due to the                            patient's oxygen desaturation. The patient                            tolerated the procedure well. Scope In: Scope Out: Findings:      No gross lesions were noted in the entire esophagus.      The Z-line was regular and was found 38 cm from the incisors.      Patient had oxygen desaturation requiring supplemental oxygen prior to       starting procedure. This was treated with bag mask ventilation. Scope       was inserted, again patient had oxygen desaturation. Scope was       withdrawn. Bag mask ventilation was performed. Scope was reinserted.       Biopsies were taken. No significant ulcer or active bleeding were found.       Scope was then withdrawn and procedure was completed.      Scattered severe inflammation with hemorrhage characterized by adherent       blood, congestion (edema), erosions and erythema was found in the entire       examined stomach. Biopsies were taken with a cold forceps for histology.      The cardia and gastric fundus were normal on retroflexion.      The duodenal bulb, first portion of the duodenum and second portion of       the duodenum were normal. Impression:               -  No gross lesions in esophagus.                           - Z-line regular, 38 cm from the incisors.                           - Gastritis with hemorrhage. Biopsied.                           - Normal duodenal bulb, first portion of the                            duodenum and second portion of the duodenum. Moderate Sedation:      Moderate  (conscious) sedation was personally administered by an       anesthesia professional. The following parameters were monitored: oxygen       saturation, heart rate, blood pressure, and response to care. Recommendation:           - Return patient to hospital ward for ongoing care.                           - Soft diet.                           - Continue present medications.                           - Await pathology results. Procedure Code(s):        --- Professional ---                           [phone], Esophagogastroduodenoscopy, flexible,                            transoral; with biopsy, single or multiple Diagnosis Code(s):        --- Professional ---                           K29.71, Gastritis, unspecified, with bleeding                           R10.13, Epigastric pain                           R13.10, Dysphagia, unspecified CPT copyright 2019 American Medical Association. All rights reserved. The codes documented in this report are preliminary and upon coder review may  be revised to meet current compliance requirements. Otis Brace, MD Otis Brace, MD 01/27/2019 9:57:54 AM Number of Addenda: 0

## 2019-01-27 NOTE — Anesthesia Preprocedure Evaluation (Signed)
Anesthesia Evaluation  Patient identified by MRN, date of birth, ID band Patient awake    Reviewed: Allergy & Precautions, NPO status , Patient's Chart, lab work & pertinent test results  Airway Mallampati: II  TM Distance: >3 FB Neck ROM: Full    Dental no notable dental hx.    Pulmonary neg pulmonary ROS,    Pulmonary exam normal breath sounds clear to auscultation       Cardiovascular hypertension, Pt. on medications +CHF  Normal cardiovascular exam Rhythm:Regular Rate:Normal     Neuro/Psych CVA negative psych ROS   GI/Hepatic negative GI ROS, Neg liver ROS,   Endo/Other  diabetes  Renal/GU negative Renal ROS  negative genitourinary   Musculoskeletal negative musculoskeletal ROS (+)   Abdominal   Peds negative pediatric ROS (+)  Hematology negative hematology ROS (+) anemia ,   Anesthesia Other Findings Spinal stenosis  Reproductive/Obstetrics negative OB ROS                             Anesthesia Physical Anesthesia Plan  ASA: III  Anesthesia Plan: MAC   Post-op Pain Management:    Induction:   PONV Risk Score and Plan: 2 and Treatment may vary due to age or medical condition  Airway Management Planned: Simple Face Mask and Nasal Cannula  Additional Equipment:   Intra-op Plan:   Post-operative Plan:   Informed Consent: I have reviewed the patients History and Physical, chart, labs and discussed the procedure including the risks, benefits and alternatives for the proposed anesthesia with the patient or authorized representative who has indicated his/her understanding and acceptance.     Dental advisory given  Plan Discussed with: CRNA  Anesthesia Plan Comments:         Anesthesia Quick Evaluation

## 2019-01-27 NOTE — Brief Op Note (Signed)
01/23/2019 - 01/27/2019  9:58 AM  PATIENT:  Kara Hanson  63 y.o. female  PRE-OPERATIVE DIAGNOSIS:  Epigastric pain, esophageal dysphagia  POST-OPERATIVE DIAGNOSIS:  gastritis, biopsy  PROCEDURE:  Procedure(s): ESOPHAGOGASTRODUODENOSCOPY (EGD) WITH PROPOFOL (N/A) BIOPSY  SURGEON:  Surgeon(s) and Role:    * Kaimen Peine, MD - Primary  Findings ----------- -Hemorrhagic gastritis.  Biopsies taken. -No evidence of ulcer disease or active bleeding. -Esophagus normal without any evidence of stricture or narrowing.  Dilation was not performed because of patient's oxygen desaturation.  Recommendations ------------------------- -Continue IV twice daily PPI while in the hospital. -Switch to p.o. once a day PPI on discharge.  Continue PPI for 6 to 8 weeks. -Avoid NSAIDs. -Start soft diet.  Advance as tolerated. -No further inpatient GI work-up planned.  GI will sign off.  Call us back if needed  Otis Brace MD, [place] 01/27/2019, 10:02 AM  Contact #  [phone]

## 2019-01-27 NOTE — Progress Notes (Signed)
Progress Note  Patient Name: Kara Hanson Date of Encounter: 01/27/2019  Primary Cardiologist: Candee Furbish, MD   Subjective   She says breathing has improved.  She has no abdominal pain.  She is getting ready to go down for an EGD.  Inpatient Medications    Scheduled Meds: . allopurinol  150 mg Oral Daily  . aspirin EC  81 mg Oral Daily  . atorvastatin  80 mg Oral q1800  . carvedilol  12.5 mg Oral BID WC  . DULoxetine  60 mg Oral Daily  . enoxaparin (LOVENOX) injection  30 mg Subcutaneous Q24H  . gabapentin  300 mg Oral BID  . insulin aspart  0-15 Units Subcutaneous TID WC  . insulin aspart  0-5 Units Subcutaneous QHS  . insulin glargine  10 Units Subcutaneous QHS  . isosorbide-hydrALAZINE  1 tablet Oral TID  . pantoprazole (PROTONIX) IV  40 mg Intravenous Q12H   Continuous Infusions: . sodium chloride 20 mL/hr at 01/26/19 1655   PRN Meds: ondansetron (ZOFRAN) IV   Vital Signs    Vitals:   01/26/19 1320 01/26/19 1539 01/26/19 2057 01/27/19 0632  BP: 116/60 122/71 124/70 (!) 149/67  Pulse: 67 71 71 81  Resp: 20  18 18   Temp: 98.3 F (36.8 C) 98.6 F (37 C) 98 F (36.7 C) 99 F (37.2 C)  TempSrc:  Oral Oral Oral  SpO2: 92% 98% 100% 97%  Weight:    75.2 kg  Height:        Intake/Output Summary (Last 24 hours) at 01/27/2019 0845 Last data filed at 01/27/2019 0200 Gross per 24 hour  Intake 347.74 ml  Output 400 ml  Net -52.26 ml   Filed Weights   01/25/19 0500 01/26/19 0607 01/27/19 0632  Weight: 76.1 kg 74.2 kg 75.2 kg    Telemetry    Sinus rhythm, occasional sinus tachycardia- Personally Reviewed  Physical Exam   GEN: No acute distress.   Neck: No JVD Cardiac: RRR, no murmurs, rubs, or gallops.  Respiratory: Clear to auscultation bilaterally. GI: Soft, nontender, non-distended  MS: No edema; No deformity. Neuro:  Nonfocal  Psych: Normal affect   Labs    Chemistry Recent Labs  Lab 01/23/19 1343 01/24/19 0428 01/25/19 0418  01/26/19 0507 01/27/19 0440  NA 137 138 136 137 135  K 4.4 4.4 5.2* 4.8 4.6  CL 98 101 99 97* 97*  CO2 29 30 30 30 26   GLUCOSE 307* 128* 99 105* 92  BUN 37* 35* 41* 44* 46*  CREATININE 1.74* 1.65* 1.95* 2.21* 2.40*  CALCIUM 9.1 9.0 8.9 9.2 9.2  PROT 6.0* 5.4*  --   --   --   ALBUMIN 2.8* 2.6*  --   --   --   AST 14* 11*  --   --   --   ALT 11 10  --   --   --   ALKPHOS 65 58  --   --   --   BILITOT 0.3 0.4  --   --   --   GFRNONAA 30* 32* 27* 23* 21*  GFRAA 35* 38* 31* 26* 24*  ANIONGAP 10 7 7 10 12      Hematology Recent Labs  Lab 01/23/19 1343 01/24/19 0428 01/27/19 0440  WBC 4.7 4.1 5.0  RBC 3.48* 3.47* 3.19*  HGB 9.5* 9.4* 8.7*  HCT 31.8* 31.8* 29.1*  MCV 91.4 91.6 91.2  MCH 27.3 27.1 27.3  MCHC 29.9* 29.6* 29.9*  RDW 14.9 14.6 14.8  PLT 207 215 199    Cardiac EnzymesNo results for input(s): TROPONINI in the last 168 hours. No results for input(s): TROPIPOC in the last 168 hours.   BNP Recent Labs  Lab 01/23/19 1343  BNP 423.4*     DDimer No results for input(s): DDIMER in the last 168 hours.   Radiology    No results found.  Cardiac Studies   Echocardiogram 01/23/2019: Impressions: 1. Left ventricular ejection fraction, by visual estimation, is 40 to 45%. The left ventricle has normal function. There is moderately increased left ventricular hypertrophy. 2. Global hypokinesis, worse apically. 3. Left ventricular diastolic Doppler parameters are consistent with restrictive filling pattern of LV diastolic filling. 4. Elevated left atrial and left ventricular end-diastolic pressures. 5. Global right ventricle has mildly reduced systolic function.The right ventricular size is normal. No increase in right ventricular wall thickness. 6. Left atrial size was normal. 7. Right atrial size was normal. 8. Moderate pleural effusion in the left lateral region. 9. The mitral valve is abnormal. Mild mitral valve regurgitation. 10. The tricuspid valve is  grossly normal. Tricuspid valve regurgitation is mild. 11. The aortic valve is tricuspid Aortic valve regurgitation is trivial by color flow Doppler. Mild aortic valve sclerosis without stenosis. 12. The pulmonic valve was grossly normal. Pulmonic valve regurgitation is trivial by color flow Doppler. 13. Mildly elevated pulmonary artery systolic pressure. 14. The inferior vena cava is normal in size with <50% respiratory variability, suggesting right atrial pressure of 8 mmHg. _______________  Left Heart Catheterization 04/25/2016:  The left ventricular systolic function is normal.  LV end diastolic pressure is normal.  The left ventricular ejection fraction is 50-55% by visual estimate.  Impressions:Ms Littman has essentially normal coronary arteries and normal LV function. I'm not sure what explains the discrepancy between the EF seen by 2-D echocardiogram and on left ventriculography. Medical therapy will be recommended. The patient may need further evaluation for syncope including either an event monitor or a loop recorder. The sheath was removed and a TR band was placed on the right wrist to achieve patient hemostasis. The patient left the lab in stable condition   Patient Profile     63 y.o. female with a historyof normal coronary arteries on cath in 2018, possible stress-induced cardiomyopathywith improvement, hypertension, hyperlipidemia, and prior CV[place]ow admitted with CHF.  Assessment & Plan    1. Acute on chronic combined CHF: Diuretics held yesterday due to rising creatinine. Weight stable. Creatinine continues to rise. Hold Lasix again today. Continue carvedilol and Bidil (no ACEI/ARB due to CKD). LVEF 40-45% by echo on 01/23/19 (down from 50% in July 2020).  2. HTN: BP elevated today. Getting ready to go down for EGD and has some abdominal pain. Continue Coreg and Bidil at present doses for now.  3. HLD: Continue statin.  4. Acute on chronic CKD stage 3:  Creatinine now up to 2.4 from 2.21 yesterday. Hold Lasix again today.   For questions or updates, please contact [place] Please consult [email or web address] for contact info under Cardiology/STEMI.      Signed, Kate Sable, MD  01/27/2019, 8:45 AM

## 2019-01-27 NOTE — Progress Notes (Addendum)
Daughter [name] is visiting. Daughter states that pt is having new symptoms since she left home. Pt startles every few minutes and has Bi UE twitching.. Mumbling incomprehensibly, and hallucinating. The hallucinations are noted in the pt's chart. Pt was receiving Bentyl, however it was D/C'd. Pt did undergo an EGD today and is still drowsy, but is A&O X 4.  Contacted on-call regarding daughter's concern for her mothers new symptoms symptoms and Dr. Maudie Mercury ordered a UA and a head CT.

## 2019-01-27 NOTE — Transfer of Care (Signed)
Immediate Anesthesia Transfer of Care Note  Patient: Kara Hanson  Procedure(s) Performed: ESOPHAGOGASTRODUODENOSCOPY (EGD) WITH PROPOFOL (N/A ) BIOPSY  Patient Location: PACU  Anesthesia Type:MAC  Level of Consciousness: sedated, patient cooperative and responds to stimulation  Airway & Oxygen Therapy: Patient Spontanous Breathing and Patient connected to nasal cannula oxygen  Post-op Assessment: Report given to RN and Post -op Vital signs reviewed and stable  Post vital signs: Reviewed and stable  Last Vitals:  Vitals Value Taken Time  BP 160/61 01/27/19 1000  Temp 36.8 C 01/27/19 1000  Pulse 75 01/27/19 1000  Resp 15 01/27/19 1000  SpO2 97 % 01/27/19 1000    Last Pain:  Vitals:   01/27/19 1000  TempSrc: Oral  PainSc: 0-No pain      Patients Stated Pain Goal: 0 (34/35/68 6168)  Complications: No apparent anesthesia complications

## 2019-01-27 NOTE — TOC Progression Note (Signed)
Transition of Care [facility]) - Progression Note    Patient Details  Name: Kara Hanson MRN: [id number] Date of Birth: [birth year]  Transition of [facility]) CM/SW Contact  Joaquin Courts, RN Phone Number: 01/27/2019, 12:21 PM  Clinical Narrative:    CM spoke with patient at bedside who reports she lives with her daughter but is planning to move to a bigger apartment. Reports her daughters home has stairs which she cannot climb and patient  has been looking for a new apartment with no stairs.  Patient declined any HH services at this time. CM did encourage the patient to let staff know if she changes her mind so CM can assist with finding an agency.  Patient reports she has rolling walker, wheelchair, and 3-in-1 at home.   Expected Discharge Plan: Home/Self Care Barriers to Discharge: Continued Medical Work up  Expected Discharge Plan and Services Expected Discharge Plan: Home/Self Care   Discharge Planning Services: CM Consult   Living arrangements for the past 2 months: Apartment                 DME Arranged: N/A DME Agency: NA       HH Arranged: Patient Refused HH           Social Determinants of Health (SDOH) Interventions    Readmission Risk Interventions Readmission Risk Prevention Plan 01/27/2019 10/02/2018 09/28/2018  Transportation Screening Complete Complete Complete  PCP or Specialist Appt within 3-5 Days Not Complete Complete Not Complete  Not Complete comments not ready to dc - Continued medical workup; will assess prior to discharge  [place] or [place] Complete Complete Complete  Social Work Consult for [place] Planning/Counseling Complete Complete Complete  Palliative Care Screening Not Applicable Not Applicable Not Applicable  Medication Review Press photographer) Complete Complete Complete  Some recent data might be hidden

## 2019-01-27 NOTE — Progress Notes (Signed)
[facility] Progress Note  Kara Hanson 63 y.o. [birth year]  CC: Epigastric abdominal pain, dysphagia   Subjective: Abdominal pain has improved.  Denies any vomiting today.  Continues to have nausea.     Objective: Vital signs in last 24 hours: Vitals:   01/27/19 0632 01/27/19 0907  BP: (!) 149/67 (!) 152/62  Pulse: 81 78  Resp: 18 18  Temp: 99 F (37.2 C) 98.3 F (36.8 C)  SpO2: 97% 100%    Physical Exam:  General.  Alert/oriented x3.  Not in acute distress Abdomen.  Soft, nontender, nondistended, mild epigastric discomfort, bowel sounds present  Lab Results: Recent Labs    01/26/19 0507 01/27/19 0440  NA 137 135  K 4.8 4.6  CL 97* 97*  CO2 30 26  GLUCOSE 105* 92  BUN 44* 46*  CREATININE 2.21* 2.40*  CALCIUM 9.2 9.2   No results for input(s): AST, ALT, ALKPHOS, BILITOT, PROT, ALBUMIN in the last 72 hours. Recent Labs    01/27/19 0440  WBC 5.0  NEUTROABS 4.0  HGB 8.7*  HCT 29.1*  MCV 91.2  PLT 199   No results for input(s): LABPROT, INR in the last 72 hours.    Assessment/Plan: -Epigastric burning sensation/epigastric abdominal pain.  Normal LFTs.  CT scan showed fatty atrophy of pancreas otherwise no acute changes. ??  Gastritis vs esophagitis vs ulcer disease.  Less likelihood of pancreatitis but remains possible. -Esophageal dysphagia -CHF/cardiomyopathy with EF of 40 to 45% - history of COVID-19 pneumonia in April 2020 with Negative COVID-19 test on 10/2018 and 01/23/2019  -Chronic anemia  Recommendations ------------------------- -Abdominal pain has improved with IV PPI.   Normal lipase. -EGD today with possible dilation.  Risks (bleeding, infection, bowel perforation that could require surgery, sedation-related changes in cardiopulmonary systems), benefits (identification and possible treatment of source of symptoms, exclusion of certain causes of symptoms), and alternatives (watchful waiting, radiographic imaging studies,  empiric medical treatment)  were explained to patient  in detail and patient wishes to proceed.   Otis Brace MD, FACP 01/27/2019, 9:22 AM  Contact #  [phone]

## 2019-01-27 NOTE — Plan of Care (Signed)
Patient with no complaints of shortness of breath however does continue to c/o abdominal pain (epigastric region). Refuses to eat any food, ate 1/2 of an ice cream and 1/2 of an [place] ice on 7 a to 7 p shift. Even with eating ice cream states it feels like it got stuck in her throat.  Patient is able to take pills.  Medicated for nausea x 2 with improvement.

## 2019-01-27 NOTE — Progress Notes (Signed)
Pt continues to hallucinate and still be able to answer orientation questions successfully. Pt signed consent form for EDG this am and was asked several times  If she understood why she was signing form. Pt experiencing tremors and experienced difficulties signing legibly.

## 2019-01-27 NOTE — Progress Notes (Signed)
Shawna Clamp Per RN ? AMS  A/P AMS, slight tremor Check CT brain  Check urinalysis

## 2019-01-28 ENCOUNTER — Inpatient Hospital Stay (HOSPITAL_COMMUNITY): Payer: Medicare Other

## 2019-01-28 ENCOUNTER — Encounter (HOSPITAL_COMMUNITY): Payer: Self-pay | Admitting: Gastroenterology

## 2019-01-28 DIAGNOSIS — I5043 Acute on chronic combined systolic (congestive) and diastolic (congestive) heart failure: Secondary | ICD-10-CM | POA: Diagnosis not present

## 2019-01-28 LAB — URINALYSIS, ROUTINE W REFLEX MICROSCOPIC
Bilirubin Urine: NEGATIVE
Glucose, UA: 50 mg/dL — AB
Hgb urine dipstick: NEGATIVE
Ketones, ur: NEGATIVE mg/dL
Nitrite: NEGATIVE
Protein, ur: >=300 mg/dL — AB
Specific Gravity, Urine: 1.015 (ref 1.005–1.030)
Squamous Epithelial / HPF: >50 — ABNORMAL HIGH (ref 0–5)
WBC, UA: >50 WBC/hpf — ABNORMAL HIGH (ref 0–5)
pH: 5 (ref 5.0–8.0)

## 2019-01-28 LAB — BASIC METABOLIC PANEL
Anion gap: 12 (ref 5–15)
BUN: 52 mg/dL — ABNORMAL HIGH (ref 8–23)
CO2: 27 mmol/L (ref 22–32)
Calcium: 9 mg/dL (ref 8.9–10.3)
Chloride: 96 mmol/L — ABNORMAL LOW (ref 98–111)
Creatinine, Ser: 3.02 mg/dL — ABNORMAL HIGH (ref 0.44–1.00)
GFR calc Af Amer: 18 mL/min — ABNORMAL LOW (ref >60)
GFR calc non Af Amer: 16 mL/min — ABNORMAL LOW (ref >60)
Glucose, Bld: 113 mg/dL — ABNORMAL HIGH (ref 70–99)
Potassium: 4.5 mmol/L (ref 3.5–5.1)
Sodium: 135 mmol/L (ref 135–145)

## 2019-01-28 LAB — GLUCOSE, CAPILLARY
Glucose-Capillary: 78 mg/dL (ref 70–99)
Glucose-Capillary: 96 mg/dL (ref 70–99)
Glucose-Capillary: 118 mg/dL — ABNORMAL HIGH (ref 70–99)
Glucose-Capillary: 90 mg/dL (ref 70–99)

## 2019-01-28 LAB — NA AND K (SODIUM & POTASSIUM), RAND UR
Potassium Urine: 63 mmol/L
Sodium, Ur: 20 mmol/L

## 2019-01-28 MED ORDER — SODIUM CHLORIDE 0.9 % IV SOLN
1.0000 g | INTRAVENOUS | Status: DC
  Administered 2019-01-28 – 2019-01-31 (×4): 1 g via INTRAVENOUS
  Filled 2019-01-28 (×4): qty 1

## 2019-01-28 NOTE — Consult Note (Signed)
Renal Service Consult Note [facility]  Tariyah Pendry 01/28/2019 [place] Requesting Physician:  Dr Wyonia Hough, C.   Reason for Consult:  AKI on CKD3 HPI: The patient is a 63 y.o. year-old w/ hx of DM2, HTN and CKD and repeated admits for acute resp distress usually due to decomp CHF +/- CAP.  This admit pateint had vol overload and pulm edema on admission, was diuresed over a 3 day period, but creat has ^'d from 1.2 >> 3.0 today.  Asked to see for renal failure.   Patient is vague historian.  Wears O2 at home nasal cannula 3-4 L /min. SOB and swelling has resolved since admit.  No CP, SOB, no abd pain or N/v/d.  No fevers or chills.  Tremors are notnew.    ROS  denies CP  no joint pain   no HA  no blurry vision  no rash  no diarrhea  no nausea/ vomiting  no dysuria  no difficulty voiding  no change in urine color    Past Medical History  Past Medical History:  Diagnosis Date  . CHF (congestive heart failure) ([place])   . CVA (cerebral vascular accident) ([place])   . Diabetes mellitus without complication ([place])   . Hypercholesteremia   . Hypertension   . Myocardial infarction ([place])   . Pneumonia 11/01/2018  . Spinal stenosis    Past Surgical History  Past Surgical History:  Procedure Laterality Date  . BIOPSY  01/27/2019   Procedure: BIOPSY;  Surgeon: Otis Brace, MD;  Location: [facility]OSCOPY;  Service: Gastroenterology;;  . BLADDER SURGERY    . CARDIAC CATHETERIZATION N/A 04/25/2016   Procedure: Left Heart Cath and Coronary Angiography;  Surgeon: Lorretta Harp, MD;  Location: [facility];  Service: Cardiovascular;  Laterality: N/A;  . CESAREAN SECTION    . CHOLECYSTECTOMY    . ESOPHAGOGASTRODUODENOSCOPY (EGD) WITH PROPOFOL N/A 01/27/2019   Procedure: ESOPHAGOGASTRODUODENOSCOPY (EGD) WITH PROPOFOL;  Surgeon: Otis Brace, MD;  Location: [facility] ENDOSCOPY;  Service: Gastroenterology;  Laterality: N/A;   Family History  Family History   Problem Relation Age of Onset  . Diabetes Mellitus II Father   . Stroke Father   . Healthy Mother        She is 89 years old.    Social History  reports that she has never smoked. She has never used smokeless tobacco. She reports that she does not drink alcohol or use drugs. Allergies  Allergies  Allergen Reactions  . Garlic Shortness Of Breath, Itching and Swelling    Hand itching and swelling  . Latex Itching  . Morphine And Related Itching and Other (See Comments)    Headache   . Other Itching    Reaction to newspaper ink -itching and headache   Home medications Prior to Admission medications   Medication Sig Start Date End Date Taking? Authorizing Provider  allopurinol (ZYLOPRIM) 300 MG tablet Take 1 tablet (300 mg total) by mouth daily. 01/08/19  Yes [name], MD  amLODipine (NORVASC) 5 MG tablet Take 1 tablet (5 mg total) by mouth at bedtime. 01/08/19 02/07/19 Yes [name], MD  aspirin EC 81 MG tablet Take 81 mg by mouth daily.   Yes [provider]  atorvastatin (LIPITOR) 80 MG tablet Take 1 tablet (80 mg total) by mouth daily at 6 PM. 01/08/19 02/07/19 Yes Newlin, Enobong, MD  carvedilol (COREG) 12.5 MG tablet Take 1 tablet (12.5 mg total) by mouth 2 (two) times daily with a  meal. 01/08/19  Yes Newlin, Charlane Ferretti, MD  DULoxetine (CYMBALTA) 60 MG capsule Take 1 capsule (60 mg total) by mouth daily. For chronic back and neck pain Patient taking differently: Take 60 mg by mouth daily as needed. For chronic back and neck pain 01/08/19  Yes Newlin, Charlane Ferretti, MD  furosemide (LASIX) 40 MG tablet Take 1 tablet (40 mg total) by mouth daily. Patient taking differently: Take 40 mg by mouth 2 (two) times daily.  01/08/19  Yes [name], MD  gabapentin (NEURONTIN) 300 MG capsule Take 1 capsule (300 mg total) by mouth 2 (two) times daily. 01/08/19 02/07/19 Yes [name], MD  insulin aspart (NOVOLOG) 100 UNIT/ML injection 0 to 12 units subcutaneously 3 times daily  before meals as per sliding scale 03/29/18  Yes Newlin, Enobong, MD  Insulin Glargine (BASAGLAR KWIKPEN) 100 UNIT/ML SOPN Inject 0.05 mLs (5 Units total) into the skin at bedtime. Patient taking differently: Inject 35 Units into the skin at bedtime.  10/02/18  Yes Bonnielee Haff, MD  lansoprazole (PREVACID) 15 MG capsule Take 15 mg by mouth daily.  10/02/18  Yes [provider]  omeprazole (PRILOSEC) 20 MG capsule Take 20 mg by mouth daily.   Yes [provider]  polyethylene glycol (MIRALAX / GLYCOLAX) 17 g packet Take 17 g by mouth daily. Patient taking differently: Take 17 g by mouth daily as needed for moderate constipation.  10/02/18  Yes Bonnielee Haff, MD  SUPER B COMPLEX/C CAPS Take 1 capsule by mouth daily.   Yes [provider]  Accu-Chek FastClix Lancets MISC USE AS DIRECTED TO TEST BLOOD SUGAR THREE TIMES DAILY 07/31/18   [name], MD  Blood Glucose Monitoring Suppl (ACCU-CHEK GUIDE) w/Device KIT 1 each by Does not apply route 3 (three) times daily. 07/31/18   [name], MD  EASY COMFORT PEN NEEDLES 31G X 5 MM MISC USE FOUR TIMES PER DAY FOR INSULIN ADMINISTRATION Patient taking differently: 4 (four) times daily.  10/02/18   [name], MD  ergocalciferol (DRISDOL) 1.25 MG (50000 UT) capsule Take 1 capsule (50,000 Units total) by mouth once a week. 09/18/18   [name], MD  glucose blood (ACCU-CHEK GUIDE) test strip USE AS DIRECTED TO TEST BLOOD SUGAR THREE TIMES DAILY 07/31/18   [name], MD  guaiFENesin-dextromethorphan (ROBITUSSIN DM) 100-10 MG/5ML syrup Take 10 mLs by mouth every 6 (six) hours. Patient not taking: Reported on 01/08/2019 11/05/18   Deatra James, MD  lactobacillus acidophilus & bulgar (LACTINEX) chewable tablet Chew 1 tablet by mouth 3 (three) times daily with meals. Patient not taking: Reported on 01/23/2019 11/05/18   Deatra James, MD  Lancet Device MISC Use as instructed 3 times daily 09/19/17   Rodell Perna  A, PA-C  methylPREDNISolone (MEDROL) 4 MG tablet Take 1 tablet (4 mg total) by mouth daily. Take medication as directed per dose pack Patient not taking: Reported on 01/08/2019 11/05/18   Deatra James, MD  Misc. Devices MISC Portable oxygen concentrator.  Diagnosis-chronic respiratory failure. 09/03/18   [name], MD  Misc. Devices MISC [place] with seat. Dx: Congestive Heart Failure 10/19/18   [name], MD  ondansetron (ZOFRAN) 4 MG tablet Take 1 tablet (4 mg total) by mouth every 6 (six) hours as needed for nausea. 07/25/18   Charlynne Cousins, MD   Liver Function Tests Recent Labs  Lab 01/23/19 1343 01/24/19 0428  AST 14* 11*  ALT 11 10  ALKPHOS 65 58  BILITOT 0.3 0.4  PROT 6.0*  5.4*  ALBUMIN 2.8* 2.6*   Recent Labs  Lab 01/26/19 1209  LIPASE 20   CBC Recent Labs  Lab 01/23/19 1343 01/24/19 0428 01/27/19 0440  WBC 4.7 4.1 5.0  NEUTROABS 3.7  --  4.0  HGB 9.5* 9.4* 8.7*  HCT 31.8* 31.8* 29.1*  MCV 91.4 91.6 91.2  PLT 207 215 116   Basic Metabolic Panel Recent Labs  Lab 01/23/19 1343 01/24/19 0428 01/25/19 0418 01/26/19 0507 01/27/19 0440 01/28/19 1012  NA 137 138 136 137 135 135  K 4.4 4.4 5.2* 4.8 4.6 4.5  CL 98 101 99 97* 97* 96*  CO2 29 30 30 30 26 27   GLUCOSE 307* 128* 99 105* 92 113*  BUN 37* 35* 41* 44* 46* 52*  CREATININE 1.74* 1.65* 1.95* 2.21* 2.40* 3.02*  CALCIUM 9.1 9.0 8.9 9.2 9.2 9.0   Iron/TIBC/Ferritin/ %Sat    Component Value Date/Time   FERRITIN 77 10/31/2018 2056    Vitals:   01/27/19 2001 01/28/19 0500 01/28/19 0518 01/28/19 1323  BP: (!) 123/53  131/62 111/64  Pulse: 78  79 73  Resp: (!) 22  20 (!) 22  Temp: 99.3 F (37.4 C)  98.7 F (37.1 C) 97.9 F (36.6 C)  TempSrc: Oral   Oral  SpO2: 92%  98% 100%  Weight:  75 kg    Height:        Exam Gen tremulous UE's, not new per pt, no distress, groggy, 3L [place] O2 No rash, cyanosis or gangrene Sclera anicteric, throat clear  No jvd or bruits Chest clear bilat  to bases, no rales or wheezing RRR no MRG Abd soft ntnd no mass or ascites +bs GU defer MS no joint effusions or deformity Ext no LE or UE edema, no wounds or ulcers Neuro is alert, Ox 3 , nf     Assessment/ Plan: 1. AKI on CKD III - baseline creat 1.1- 1.5.  Creat goes up w/ diuresis typically but not this high. RN informs me pt is "retaining", had 600cc in bladder overnight, did I/O cath.  Will place foley. She may benefit from some IVF's, but let's wait til the morning and see if bladder cath improves renal function. Will follow.   2. HTN - ok to continue bidil and coreg, holding orders written. Keep BP > 110.  3. Acute/chronic CHF - sp diuresis here, SOB resolved, is on home O2 dose of 3-4 L [place].  4. DM2 5. H/ o CVA      Kelly Splinter  MD 01/28/2019, 5:02 PM

## 2019-01-28 NOTE — Plan of Care (Addendum)
Pt continues to have poor intake. D/T pt's poor intake, her OP is low. Pt did drink 1/2 of a [place] and a few bites of Jello, however she reported discomfort afterwards.  Pt also having problems passing urine. Bladder scan shows 681 mls.and UA is + for UTI. Info passed on to 1st shift nurse.

## 2019-01-28 NOTE — Progress Notes (Signed)
PROGRESS NOTE    Kara Hanson  UEA:[id number] DOB: 11-Mar-1956 DOA: 01/23/2019 PCP: [name], MD   Brief Narrative:  Patient is a 36 female with history of congestive heart failure with action fraction 50% as per echo on 11/01/2018 who presented with shortness of breath, dyspnea on exertion, orthopnea. She is on 4 L of oxygen at home 24/7 for her chronic hypoxia. On presentation she was found to be in acute on chronic CHF. She had bilateral basal crackles, bilateral lower extremity edema and elevated JVD. Echocardiogram done here today showed ejection fraction of 40 to 45%. She was started onIV Lasix. Cardiology following.Patient also complained of persistent nausea, vomiting, abdominal pain, not tolerating diet. GI consulted. Plan for EGD tomorrow.   Assessment & Plan:   Active Problems:   Hyperlipidemia LDL goal <70   Acute on chronic combined systolic and diastolic CHF (congestive heart failure) (HCC)   Controlled type 2 diabetes with neuropathy (HCC)   CHF (congestive heart failure) (HCC)   Epigastric pain   Acute renal failure superimposed on stage 3 chronic kidney disease (HCC)   Acute on chronic systolic CHF: As previously noted, presented with dyspnea on minimal exertion, orthopnea. Had bilateral lower extremity edema, basal crackles and elevated JVD on presentation. Elevated BNP. echocardiogram showed decrease ejection fraction to 40 to 45%. Started onLasix 60 mg twice a day but this has been held 2 days. Continue input and output monitoring, daily weight. Fluid restriction to less than 1.5 L a day. Salt restriction to less than 2 g a day. Cardiology following. Currently on carvedilol and BiDil. CT abdomen/pelvis done yesterday showed bilateral moderate pleural effusion. Patient denied thoracentesis. Since creatinine bumped up, we CONTINUE to held Lasix.  AKI: Baseline creatinine ranges from 1.3-1.6. now 3.02, not consistent with a pure  overdiuresis, requested neph consult,   Abdominal discomfort/pain with erosive gastritis:Patient complained of persistent nausea, vomiting, abdominal pain, not tolerating diet. GI consulted.   EGD showed erosive gastritis without acute bleeding ulcer CT abdomen/pelvis did not show any acute intra-abdominal abnormalities. Continue Protonix although currently 40 IV twice daily started today patient's abdominal pain improving, .   Diabetes type 2: Recent hemoglobin A1c of 7.8. On insulin at home. Continue current insulin regimen here. On Neurontin and Cymbalta for diabetic neuropathy.  Hypertension:Currently blood pressure stable. Continue current medicines  Hyperlipidemia: Continue better  Gout:On allopurinol  Deconditioning/debility: I have requested for PT evaluation.  DVT prophylaxis: Lovenox SQ  Code Status: Full    Code Status Orders  (From admission, onward)         Start     Ordered   01/23/19 1757  Full code  Continuous     01/23/19 1757        Code Status History    Date Active Date Inactive Code Status Order ID Comments User Context   11/01/2018 0029 11/05/2018 2014 Full Code [id number]  Sid Falcon, MD ED   09/24/2018 2303 10/02/2018 1600 Full Code [id number]  Elwyn Reach, MD Inpatient   07/20/2018 1655 07/26/2018 1800 Full Code [id number]  Debbe Odea, MD ED   03/20/2018 0402 03/24/2018 1949 Full Code [id number]  Norval Morton, MD ED   11/27/2017 0705 11/30/2017 1625 Full Code [id number]  Radene Gunning, NP ED   10/01/2016 0033 10/01/2016 1946 Full Code [id number]  Maryellen Pile, MD ED   06/17/2016 1720 06/18/2016 2022 Full Code [id number]  Reyne Dumas, MD ED   04/23/2016 0418 04/26/2016 1716 Full Code [id number]  Hal Hope,  Doreatha Lew, MD ED   Advance Care Planning Activity     Family Communication: Discussed in detail with patient today Disposition Plan:   Patient will need inpatient secondary to worsening renal function, pending subspecialty expert  consultation, monitoring of electrolytes and renal function.  Patient not medically stable for discharge Consults called: None Admission status: Inpatient   Consultants:   gi, Renal  Procedures:  Ct Abdomen Pelvis Wo Contrast  Result Date: 01/24/2019 CLINICAL DATA:  63 year old female with history of abdominal distension. Epigastric abdominal pain. EXAM: CT ABDOMEN AND PELVIS WITHOUT CONTRAST TECHNIQUE: Multidetector CT imaging of the abdomen and pelvis was performed following the standard protocol without IV contrast. COMPARISON:  CT the abdomen and pelvis 01/02/2018. FINDINGS: Lower chest: Moderate bilateral pleural effusions with areas of atelectasis and/or consolidation in the lower lobes of the lungs bilaterally. Atherosclerotic calcifications in the thoracic aorta as well as the left main, left anterior descending, left circumflex and right coronary arteries. Calcifications of the aortic valve. Cardiomegaly. Hepatobiliary: No suspicious cystic or solid hepatic lesions are confidently identified on today's noncontrast CT examination. Status post cholecystectomy. Pancreas: Fatty atrophy in the pancreas. No discrete pancreatic mass, no definite pancreatic mass or peripancreatic fluid collections or inflammatory changes are noted on today's noncontrast CT examination. Spleen: Unremarkable. Adrenals/Urinary Tract: Unenhanced appearance of the kidneys and bilateral adrenal glands is unremarkable. No hydroureteronephrosis. Urinary bladder is unremarkable in appearance. Stomach/Bowel: Unenhanced appearance of the stomach is normal. There is no pathologic dilatation of small bowel or colon. Multiple scattered colonic diverticulae are noted, without surrounding inflammatory changes to suggest an acute diverticulitis at this time. Normal appendix. Vascular/Lymphatic: Aortic atherosclerosis. Retroaortic left renal vein (normal anatomical variant) incidentally noted. No lymphadenopathy identified in the  abdomen or pelvis. Reproductive: Unenhanced appearance of the uterus and ovaries is unremarkable. Other: Small umbilical hernia containing only omental fat. No significant volume of ascites. No pneumoperitoneum. Musculoskeletal: There are no aggressive appearing lytic or blastic lesions noted in the visualized portions of the skeleton. IMPRESSION: 1. No acute findings are noted in the abdomen or pelvis to account for the patient's symptoms. 2. Moderate bilateral pleural effusions with dependent areas of atelectasis and/or consolidation in the lower lobes of the lungs bilaterally. 3. Colonic diverticulosis without evidence of acute diverticulitis at this time. 4. Small umbilical hernia containing only omental fat. No associated bowel incarceration or obstruction at this time. Electronically Signed   By: Vinnie Langton M.D.   On: 01/24/2019 18:09   Ct Head Wo Contrast  Result Date: 01/28/2019 CLINICAL DATA:  63 year old female with altered mental status. EXAM: CT HEAD WITHOUT CONTRAST TECHNIQUE: Contiguous axial images were obtained from the base of the skull through the vertex without intravenous contrast. COMPARISON:  Head CT dated 04/25/2016 FINDINGS: Brain: The ventricles and sulci appropriate size for patient's age. Mild periventricular and deep white matter chronic microvascular ischemic changes noted. There is no acute intracranial hemorrhage. No mass effect or midline shift. No extra-axial fluid collection. Vascular: No hyperdense vessel or unexpected calcification. Skull: Normal. Negative for fracture or focal lesion. Sinuses/Orbits: The visualized paranasal sinuses and the left mastoid air cells are clear. Right mastoid effusions noted. Other: None IMPRESSION: 1. No acute intracranial hemorrhage. 2. Mild chronic microvascular ischemic changes. 3. Right mastoid effusions. Electronically Signed   By: Anner Crete M.D.   On: 01/28/2019 02:37   Dg Chest Port 1 View  Result Date:  01/23/2019 CLINICAL DATA:  63 year old female with shortness of breath. On home oxygen. EXAM: PORTABLE  CHEST 1 VIEW COMPARISON:  10/31/2018 and earlier. FINDINGS: Portable AP semi upright view at 1509 hours. Stable lung volumes and mediastinal contours. Visualized tracheal air column is within normal limits. Further increased bilateral interstitial and lung base opacity, with obscuration of the diaphragm now. No superimposed pneumothorax. No definite air bronchograms. Paucity bowel gas in the upper abdomen. No acute osseous abnormality identified. IMPRESSION: Increasing bilateral lung opacity favored due to acute pulmonary edema with lung base atelectasis and possibly small pleural effusions. Bilateral lower lobe pneumonia felt less likely. Electronically Signed   By: Genevie Ann M.D.   On: 01/23/2019 15:18     Antimicrobials:   none    Subjective: Patient much more awake and alert this morning Still reports decreased urinary output Rising creatinine noted  Objective: Vitals:   01/27/19 2001 01/28/19 0500 01/28/19 0518 01/28/19 1323  BP: (!) 123/53  131/62 111/64  Pulse: 78  79 73  Resp: (!) 22  20 (!) 22  Temp: 99.3 F (37.4 C)  98.7 F (37.1 C) 97.9 F (36.6 C)  TempSrc: Oral   Oral  SpO2: 92%  98% 100%  Weight:  75 kg    Height:        Intake/Output Summary (Last 24 hours) at 01/28/2019 1454 Last data filed at 01/28/2019 1208 Gross per 24 hour  Intake 540 ml  Output 550 ml  Net -10 ml   Filed Weights   01/26/19 0607 01/27/19 0632 01/28/19 0500  Weight: 74.2 kg 75.2 kg 75 kg    Examination:  General exam: Appears calm and comfortable  Respiratory system: Clear to auscultation. Respiratory effort normal. Cardiovascular system: S1 & S2 heard, RRR. No JVD, murmurs, rubs, gallops or clicks. No pedal edema. Gastrointestinal system: Abdomen is nondistended, soft still remains mildly tender. No organomegaly or masses felt. Normal bowel sounds heard. Central nervous system:  Alert and oriented.  Appears to have a mild intentional tremor which she reports is chronic although worse Extremities: Warm well perfused, trace edema, Skin: No rashes, lesions or ulcers Psychiatry: Judgement and insight appear normal. Mood & affect appropriate.     Data Reviewed: I have personally reviewed following labs and imaging studies  CBC: Recent Labs  Lab 01/23/19 1343 01/24/19 0428 01/27/19 0440  WBC 4.7 4.1 5.0  NEUTROABS 3.7  --  4.0  HGB 9.5* 9.4* 8.7*  HCT 31.8* 31.8* 29.1*  MCV 91.4 91.6 91.2  PLT 207 215 979   Basic Metabolic Panel: Recent Labs  Lab 01/24/19 0428 01/25/19 0418 01/26/19 0507 01/27/19 0440 01/28/19 1012  NA 138 136 137 135 135  K 4.4 5.2* 4.8 4.6 4.5  CL 101 99 97* 97* 96*  CO2 30 30 30 26 27   GLUCOSE 128* 99 105* 92 113*  BUN 35* 41* 44* 46* 52*  CREATININE 1.65* 1.95* 2.21* 2.40* 3.02*  CALCIUM 9.0 8.9 9.2 9.2 9.0   GFR: Estimated Creatinine Clearance: 16.8 mL/min (A) (by C-G formula based on SCr of 3.02 mg/dL (H)). Liver Function Tests: Recent Labs  Lab 01/23/19 1343 01/24/19 0428  AST 14* 11*  ALT 11 10  ALKPHOS 65 58  BILITOT 0.3 0.4  PROT 6.0* 5.4*  ALBUMIN 2.8* 2.6*   Recent Labs  Lab 01/26/19 1209  LIPASE 20   No results for input(s): AMMONIA in the last 168 hours. Coagulation Profile: No results for input(s): INR, PROTIME in the last 168 hours. Cardiac Enzymes: No results for input(s): CKTOTAL, CKMB, CKMBINDEX, TROPONINI in the last 168 hours. BNP (  last 3 results) No results for input(s): PROBNP in the last 8760 hours. HbA1C: No results for input(s): HGBA1C in the last 72 hours. CBG: Recent Labs  Lab 01/27/19 1647 01/27/19 1910 01/27/19 2237 01/28/19 0734 01/28/19 1200  GLUCAP 126* 131* 101* 78 96   Lipid Profile: No results for input(s): CHOL, HDL, LDLCALC, TRIG, CHOLHDL, LDLDIRECT in the last 72 hours. Thyroid Function Tests: No results for input(s): TSH, T4TOTAL, FREET4, T3FREE, THYROIDAB in  the last 72 hours. Anemia Panel: No results for input(s): VITAMINB12, FOLATE, FERRITIN, TIBC, IRON, RETICCTPCT in the last 72 hours. Sepsis Labs: No results for input(s): PROCALCITON, LATICACIDVEN in the last 168 hours.  Recent Results (from the past 240 hour(s))  SARS CORONAVIRUS 2 (TAT 6-24 HRS) Nasopharyngeal Nasopharyngeal Swab     Status: None   Collection Time: 01/23/19  3:55 PM   Specimen: Nasopharyngeal Swab  Result Value Ref Range Status   SARS Coronavirus 2 NEGATIVE NEGATIVE Final    Comment: (NOTE) SARS-CoV-2 target nucleic acids are NOT DETECTED. The SARS-CoV-2 RNA is generally detectable in upper and lower respiratory specimens during the acute phase of infection. Negative results do not preclude SARS-CoV-2 infection, do not rule out co-infections with other pathogens, and should not be used as the sole basis for treatment or other patient management decisions. Negative results must be combined with clinical observations, patient history, and epidemiological information. The expected result is Negative. Fact Sheet for Patients: SugarRoll.be Fact Sheet for Healthcare Providers: https://www.woods-mathews.com/ This test is not yet approved or cleared by the [place] FDA and  has been authorized for detection and/or diagnosis of SARS-CoV-2 by FDA under an Emergency Use Authorization (EUA). This EUA will remain  in effect (meaning this test can be used) for the duration of the COVID-19 declaration under Section 56 4(b)(1) of the Act, 21 U.S.C. section 360bbb-3(b)(1), unless the authorization is terminated or revoked sooner. Performed at [facility], [place] [address] [place], [place] [address]          Radiology Studies: Ct Head Wo Contrast  Result Date: 01/28/2019 CLINICAL DATA:  63 year old female with altered mental status. EXAM: CT HEAD WITHOUT CONTRAST TECHNIQUE: Contiguous axial images were obtained from  the base of the skull through the vertex without intravenous contrast. COMPARISON:  Head CT dated 04/25/2016 FINDINGS: Brain: The ventricles and sulci appropriate size for patient's age. Mild periventricular and deep white matter chronic microvascular ischemic changes noted. There is no acute intracranial hemorrhage. No mass effect or midline shift. No extra-axial fluid collection. Vascular: No hyperdense vessel or unexpected calcification. Skull: Normal. Negative for fracture or focal lesion. Sinuses/Orbits: The visualized paranasal sinuses and the left mastoid air cells are clear. Right mastoid effusions noted. Other: None IMPRESSION: 1. No acute intracranial hemorrhage. 2. Mild chronic microvascular ischemic changes. 3. Right mastoid effusions. Electronically Signed   By: Anner Crete M.D.   On: 01/28/2019 02:37        Scheduled Meds:  allopurinol  150 mg Oral Daily   aspirin EC  81 mg Oral Daily   atorvastatin  80 mg Oral q1800   carvedilol  12.5 mg Oral BID WC   DULoxetine  60 mg Oral Daily   enoxaparin (LOVENOX) injection  30 mg Subcutaneous Q24H   gabapentin  300 mg Oral BID   insulin aspart  0-15 Units Subcutaneous TID WC   insulin aspart  0-5 Units Subcutaneous QHS   insulin glargine  10 Units Subcutaneous QHS   isosorbide-hydrALAZINE  1 tablet  Oral TID   pantoprazole (PROTONIX) IV  40 mg Intravenous Q12H   Continuous Infusions:  cefTRIAXone (ROCEPHIN)  IV 1 g (01/28/19 1056)     LOS: 5 days    Time spent: 81 min    Nicolette Bang, MD [facility]  If 7PM-7AM, please contact night-coverage  01/28/2019, 2:54 PM

## 2019-01-28 NOTE — Progress Notes (Signed)
Foley placed per order.  300cc's of clear yellow urine returned.

## 2019-01-28 NOTE — Care Management Important Message (Signed)
Important Message  Patient Details IM Letter given to Cookie McGibboney RN to present to the Patient Name: Kara Hanson MRN: [id number] Date of Birth: 05-18-1955   Medicare Important Message Given:  Yes     Kerin Salen 01/28/2019, 11:10 AM

## 2019-01-28 NOTE — Progress Notes (Addendum)
Progress Note  Patient Name: Kara Hanson Date of Encounter: 01/28/2019  Primary Cardiologist: Candee Furbish, MD   Subjective   Head CT ordered last night for evaluation of altered mental status with hallucinations and tremors. CT showed mild chronic microvascular ischemic changes and right mastoid effusion but no acute intracranial hemorrhage.   This morning, patient states she is so drowsy and is having a hard time keeping her eyes open. She is alert and oriented x3. She can tell me her name, birthday, place, month/year, and president. She initially gave the wrong hospital/city but then corrected herself. She also kept repeated her name/birthday multiple times.   She states her breathing has improved but she is still not able to lay flat. No chest pain. Abdominal pain has improved but continues to have intermittent nausea. EGD yesterday showed hemorrhagic gastritis with no active bleeding. Patient also continues to report decreased appetite with poor oral intake and decreased urination. She states she just does not feel like she needs to go. Bladder scan this morning showed 681 mLs. Since then, patient has documented urinary output of 500 mL.  Inpatient Medications    Scheduled Meds: . allopurinol  150 mg Oral Daily  . aspirin EC  81 mg Oral Daily  . atorvastatin  80 mg Oral q1800  . carvedilol  12.5 mg Oral BID WC  . DULoxetine  60 mg Oral Daily  . enoxaparin (LOVENOX) injection  30 mg Subcutaneous Q24H  . gabapentin  300 mg Oral BID  . insulin aspart  0-15 Units Subcutaneous TID WC  . insulin aspart  0-5 Units Subcutaneous QHS  . insulin glargine  10 Units Subcutaneous QHS  . isosorbide-hydrALAZINE  1 tablet Oral TID  . pantoprazole (PROTONIX) IV  40 mg Intravenous Q12H   Continuous Infusions: . cefTRIAXone (ROCEPHIN)  IV     PRN Meds: ondansetron (ZOFRAN) IV   Vital Signs    Vitals:   01/27/19 1312 01/27/19 2001 01/28/19 0500 01/28/19 0518  BP: 133/68 (!) 123/53   131/62  Pulse: 81 78  79  Resp: 18 (!) 22  20  Temp: 98.9 F (37.2 C) 99.3 F (37.4 C)  98.7 F (37.1 C)  TempSrc: Oral Oral    SpO2: 92% 92%  98%  Weight:   75 kg   Height:        Intake/Output Summary (Last 24 hours) at 01/28/2019 0958 Last data filed at 01/28/2019 0857 Gross per 24 hour  Intake 240 ml  Output 950 ml  Net -710 ml   Last 3 Weights 01/28/2019 01/27/2019 01/26/2019  Weight (lbs) 165 lb 5.5 oz 165 lb 12.6 oz 163 lb 8 oz  Weight (kg) 75 kg 75.2 kg 74.163 kg      Telemetry    Normal sinus rhythm with rates in the 70's to 80's. - Personally Reviewed  ECG    No new ECG tracing today. - Personally Reviewed  Physical Exam   GEN: No acute distress.   Neck: Supple. No JVD. Cardiac: RRR. No murmurs, rubs, or gallops.  Respiratory: Clear to auscultation bilaterally. GI: Soft, non-distended, and non-tender.  MS: No lower extremity edema. No deformity. Neuro: Alert and oriented x3. No focal deficits. Upper extremity tremor. Psych: Normal affect. Responds appropriately.  Labs    High Sensitivity Troponin:   Recent Labs  Lab 01/23/19 1343 01/23/19 1555 01/23/19 2034  TROPONINIHS 10 11 13       Chemistry Recent Labs  Lab 01/23/19 1343 01/24/19 0428 01/25/19 0418 01/26/19  0507 01/27/19 0440  NA 137 138 136 137 135  K 4.4 4.4 5.2* 4.8 4.6  CL 98 101 99 97* 97*  CO2 29 30 30 30 26   GLUCOSE 307* 128* 99 105* 92  BUN 37* 35* 41* 44* 46*  CREATININE 1.74* 1.65* 1.95* 2.21* 2.40*  CALCIUM 9.1 9.0 8.9 9.2 9.2  PROT 6.0* 5.4*  --   --   --   ALBUMIN 2.8* 2.6*  --   --   --   AST 14* 11*  --   --   --   ALT 11 10  --   --   --   ALKPHOS 65 58  --   --   --   BILITOT 0.3 0.4  --   --   --   GFRNONAA 30* 32* 27* 23* 21*  GFRAA 35* 38* 31* 26* 24*  ANIONGAP 10 7 7 10 12      Hematology Recent Labs  Lab 01/23/19 1343 01/24/19 0428 01/27/19 0440  WBC 4.7 4.1 5.0  RBC 3.48* 3.47* 3.19*  HGB 9.5* 9.4* 8.7*  HCT 31.8* 31.8* 29.1*  MCV 91.4 91.6  91.2  MCH 27.3 27.1 27.3  MCHC 29.9* 29.6* 29.9*  RDW 14.9 14.6 14.8  PLT 207 215 199    BNP Recent Labs  Lab 01/23/19 1343  BNP 423.4*     DDimer No results for input(s): DDIMER in the last 168 hours.   Radiology    Ct Head Wo Contrast  Result Date: 01/28/2019 CLINICAL DATA:  63 year old female with altered mental status. EXAM: CT HEAD WITHOUT CONTRAST TECHNIQUE: Contiguous axial images were obtained from the base of the skull through the vertex without intravenous contrast. COMPARISON:  Head CT dated 04/25/2016 FINDINGS: Brain: The ventricles and sulci appropriate size for patient's age. Mild periventricular and deep white matter chronic microvascular ischemic changes noted. There is no acute intracranial hemorrhage. No mass effect or midline shift. No extra-axial fluid collection. Vascular: No hyperdense vessel or unexpected calcification. Skull: Normal. Negative for fracture or focal lesion. Sinuses/Orbits: The visualized paranasal sinuses and the left mastoid air cells are clear. Right mastoid effusions noted. Other: None IMPRESSION: 1. No acute intracranial hemorrhage. 2. Mild chronic microvascular ischemic changes. 3. Right mastoid effusions. Electronically Signed   By: Anner Crete M.D.   On: 01/28/2019 02:37    Cardiac Studies   Echocardiogram 01/23/2019: Impressions: 1. Left ventricular ejection fraction, by visual estimation, is 40 to 45%. The left ventricle has normal function. There is moderately increased left ventricular hypertrophy. 2. Global hypokinesis, worse apically. 3. Left ventricular diastolic Doppler parameters are consistent with restrictive filling pattern of LV diastolic filling. 4. Elevated left atrial and left ventricular end-diastolic pressures. 5. Global right ventricle has mildly reduced systolic function.The right ventricular size is normal. No increase in right ventricular wall thickness. 6. Left atrial size was normal. 7. Right atrial  size was normal. 8. Moderate pleural effusion in the left lateral region. 9. The mitral valve is abnormal. Mild mitral valve regurgitation. 10. The tricuspid valve is grossly normal. Tricuspid valve regurgitation is mild. 11. The aortic valve is tricuspid Aortic valve regurgitation is trivial by color flow Doppler. Mild aortic valve sclerosis without stenosis. 12. The pulmonic valve was grossly normal. Pulmonic valve regurgitation is trivial by color flow Doppler. 13. Mildly elevated pulmonary artery systolic pressure. 14. The inferior vena cava is normal in size with <50% respiratory variability, suggesting right atrial pressure of 8 mmHg. _______________  Left Heart Catheterization  04/25/2016:  The left ventricular systolic function is normal.  LV end diastolic pressure is normal.  The left ventricular ejection fraction is 50-55% by visual estimate.  Impressions: Ms Percle has essentially normal coronary arteries and normal LV function. I'm not sure what explains the discrepancy between the EF seen by 2-D echocardiogram and on left ventriculography. Medical therapy will be recommended. The patient may need further evaluation for syncope including either an event monitor or a loop recorder. The sheath was removed and a TR band was placed on the right wrist to achieve patient hemostasis. The patient left the lab in stable condition.  Patient Profile     Lowella Kindley is a 63 y.o. female with a history of normal coronary arteries on cath in 2018, possible stress-induced cardiomyopathy, hypertension, hyperlipidemia, and prior CVA who was admitted on 01/23/2019 for acute on chronic combine CHF after presenting with worsening dyspnea, orthopnea, PND, lower extremity and weight gain. Cardiology consulted for assistance. Patient has also been complaining of significant abdominal pain and nausea for which GI is now following.   Assessment & Plan    Acute on Chronic Combined CHF  - Chest  x-ray favored edema over pneumonia.  - BNP elevated in the 400's.  - Echo this admission showed showed LVEF of 40-45% (slightly down from 50% in 10/2018) global hypokinesis (worse apically) and moderate LVH with grade 3 diastolic dysfunction and elevated left atrial and left ventricular end-diastolic pressures.  Also noted to have mildly elevated PASP and moderate left pleural effusion. - Patient initially diuresed with IV Lasix but this has been held since 10/23 due to rise in creatinine. Net 450.3 L since admission. Creatinine has continued to rise since holding Lasix. Today's creat 3.02 - Continue to hold diuretics for now. - Continue Coreg 12.5mg  twice daily and Bidil 20-37.5mg  three times daily. - Continue to monitor daily weights, strict I/O's, and renal function.   Abdominal Pain and Nausea - Abdominal CT showed no acute findings to account for symptoms. - GI consulted. EGD yesterday showed hemorrhagic gastritis - biopsies were taken. No evidence of ulcer disease or active bleeding. Continue IV PPI twice daily was recommended throughout hospitalization. GI has now signed off.   Hypertension  - BP well controlled at 131/62.  - Continue Coreg and Bidil as above.  Hyperlipidemia  - Continue home statin.  Acute on Chronic Kidney Disease Stage III  - Creatinine trending up the last several days 1.65 >> 1.95 >> 2.21 >> 2.40. Today's BMET pending.  - Likely due to decreased PO intake. She also reports decreased urinary output. May need some  - Continue to avoid nephrotoxic agents. - Daily BMET. - Suggest renal consult.   UTI - Urinalysis this morning consistent with UTI. Urine culture pending. - Started on Rocephin.   Anemia - Hemoglobin 8.7 yesterday. - No active bleeding on EGD. - Continue PPI. - Management per primary team.  For questions or updates, please contact [place] Please consult [email or web address] for contact info under        Signed, Darreld Mclean, PA-C   01/28/2019, 9:58 AM    History and all data above reviewed.  Patient examined.  I agree with the findings as above. She is awake and alert.  No distress  The patient exam reveals COR:RRR  ,  Lungs: Clear  ,  Abd: Positive bowel sounds, no rebound no guarding, Ext no edema  .  All available labs, radiology testing, previous records reviewed. Agree with documented assessment  and plan.   AKI:  This seems out of proportion to the degree of diuresis.  I don't suspect hepatorenal syndrome.  I would suggest nephrology consult to further evaluate.  Holding diuresis.  She does not seem to have overt HF untreated at this point.   Jeneen Rinks Jaree Trinka  12:40 PM  01/28/2019

## 2019-01-28 NOTE — Progress Notes (Signed)
Physical Therapy Treatment Patient Details Name: Kara Hanson MRN: [id number] DOB: 03-11-1956 Today's Date: 01/28/2019    History of Present Illness Patient is a 52 female with history of congestive heart failure with action fraction 50% as per echo on 11/01/2018 who presented with shortness of breath, dyspnea on exertion, orthopnea.  Pt was COVID-19 4/9-4/22/20 and COVID-19 PNA 09/24/18. She is on 4 L of oxygen at home 24/7 for her chronic hypoxia.    PT Comments    Pt assisted with ambulating in hallway and required at least min assist for steadying and safety.  Pt quickly fell back asleep upon positioning in recliner.  Pt with AMS and tremors yesterday, and cognition appears improved today however tremors still present.   Follow Up Recommendations  Home health PT;Supervision/Assistance - 24 hour     Equipment Recommendations  None recommended by PT    Recommendations for Other Services       Precautions / Restrictions Precautions Precautions: Fall Precaution Comments: chronic 4L O2    Mobility  Bed Mobility Overal bed mobility: Modified Independent             General bed mobility comments: HOB elevated  Transfers Overall transfer level: Needs assistance Equipment used: Rolling walker (2 wheeled) Transfers: Sit to/from Stand Sit to Stand: Min assist         General transfer comment: assist to steady with rise and control descent; verbal cues for UE placement  Ambulation/Gait Ambulation/Gait assistance: Min assist Gait Distance (Feet): 100 Feet Assistive device: Rolling walker (2 wheeled) Gait Pattern/deviations: Step-through pattern;Decreased stride length     General Gait Details: pt placed on 4L O2 [name] (on 3L O2 at rest in room), SPO2 93% during ambulation, pt very uncoordinated and required assist for steadying, pt reports UE fatigue, UEs also with tremors throughout session   Stairs             Wheelchair Mobility    Modified Rankin  (Stroke Patients Only)       Balance Overall balance assessment: Needs assistance         Standing balance support: Bilateral upper extremity supported Standing balance-Leahy Scale: Poor Standing balance comment: requires UE support, requires min steadying assist                            Cognition Arousal/Alertness: Awake/alert Behavior During Therapy: WFL for tasks assessed/performed Overall Cognitive Status: Within Functional Limits for tasks assessed                                 General Comments: quickly falls asleep, able to follow commands      Exercises      General Comments        Pertinent Vitals/Pain Pain Assessment: No/denies pain    Home Living                      Prior Function            PT Goals (current goals can now be found in the care plan section) Progress towards PT goals: Progressing toward goals    Frequency    Min 3X/week      PT Plan Current plan remains appropriate    Co-evaluation              AM-PAC PT "6 Clicks" Mobility   Outcome Measure  Help  needed turning from your back to your side while in a flat bed without using bedrails?: A Little Help needed moving from lying on your back to sitting on the side of a flat bed without using bedrails?: A Little Help needed moving to and from a bed to a chair (including a wheelchair)?: A Little Help needed standing up from a chair using your arms (e.g., wheelchair or bedside chair)?: A Little Help needed to walk in hospital room?: A Little Help needed climbing 3-5 steps with a railing? : A Lot 6 Click Score: 17    End of Session Equipment Utilized During Treatment: Gait belt;Oxygen Activity Tolerance: Patient tolerated treatment well Patient left: in chair;with call bell/phone within reach;with chair alarm set Nurse Communication: Mobility status PT Visit Diagnosis: Muscle weakness (generalized) (M62.81);Difficulty in walking, not  elsewhere classified (R26.2)     Time: 7858-8502 PT Time Calculation (min) (ACUTE ONLY): 12 min  Charges:  $Gait Training: 8-22 mins                    Carmelia Bake, PT, DPT Acute Rehabilitation Services Office: [phone] Pager: [phone]   Trena Platt 01/28/2019, 11:18 AM

## 2019-01-29 ENCOUNTER — Inpatient Hospital Stay (HOSPITAL_COMMUNITY): Payer: Medicare Other

## 2019-01-29 DIAGNOSIS — I5043 Acute on chronic combined systolic (congestive) and diastolic (congestive) heart failure: Secondary | ICD-10-CM | POA: Diagnosis not present

## 2019-01-29 LAB — GLUCOSE, CAPILLARY
Glucose-Capillary: 81 mg/dL (ref 70–99)
Glucose-Capillary: 81 mg/dL (ref 70–99)
Glucose-Capillary: 121 mg/dL — ABNORMAL HIGH (ref 70–99)
Glucose-Capillary: 104 mg/dL — ABNORMAL HIGH (ref 70–99)

## 2019-01-29 LAB — URINE CULTURE

## 2019-01-29 LAB — BASIC METABOLIC PANEL
Anion gap: 10 (ref 5–15)
BUN: 50 mg/dL — ABNORMAL HIGH (ref 8–23)
CO2: 27 mmol/L (ref 22–32)
Calcium: 8.9 mg/dL (ref 8.9–10.3)
Chloride: 99 mmol/L (ref 98–111)
Creatinine, Ser: 2.7 mg/dL — ABNORMAL HIGH (ref 0.44–1.00)
GFR calc Af Amer: 21 mL/min — ABNORMAL LOW (ref >60)
GFR calc non Af Amer: 18 mL/min — ABNORMAL LOW (ref >60)
Glucose, Bld: 92 mg/dL (ref 70–99)
Potassium: 4 mmol/L (ref 3.5–5.1)
Sodium: 136 mmol/L (ref 135–145)

## 2019-01-29 LAB — SURGICAL PATHOLOGY

## 2019-01-29 MED ORDER — CHLORHEXIDINE GLUCONATE CLOTH 2 % EX PADS
6.0000 | MEDICATED_PAD | Freq: Every day | CUTANEOUS | Status: DC
  Administered 2019-01-29 – 2019-02-07 (×10): 6 via TOPICAL

## 2019-01-29 NOTE — Progress Notes (Addendum)
Progress Note  Patient Name: Kara Hanson Date of Encounter: 01/29/2019  Primary Cardiologist: Candee Furbish, MD   Subjective   Increased SOB and cough with lying back.  This started last pm some chest discomfort with coughing  Inpatient Medications    Scheduled Meds: . allopurinol  150 mg Oral Daily  . aspirin EC  81 mg Oral Daily  . atorvastatin  80 mg Oral q1800  . carvedilol  12.5 mg Oral BID WC  . Chlorhexidine Gluconate Cloth  6 each Topical Daily  . DULoxetine  60 mg Oral Daily  . enoxaparin (LOVENOX) injection  30 mg Subcutaneous Q24H  . gabapentin  300 mg Oral BID  . insulin aspart  0-15 Units Subcutaneous TID WC  . insulin aspart  0-5 Units Subcutaneous QHS  . insulin glargine  10 Units Subcutaneous QHS  . isosorbide-hydrALAZINE  1 tablet Oral TID  . pantoprazole (PROTONIX) IV  40 mg Intravenous Q12H   Continuous Infusions: . cefTRIAXone (ROCEPHIN)  IV 1 g (01/29/19 0924)   PRN Meds: ondansetron (ZOFRAN) IV   Vital Signs    Vitals:   01/28/19 1323 01/28/19 2124 01/28/19 2200 01/29/19 0607  BP: 111/64 (!) 134/54 137/64 (!) 132/57  Pulse: 73 80 81 80  Resp: (!) 22 18 18 20   Temp: 97.9 F (36.6 C) 98.2 F (36.8 C) 98.7 F (37.1 C) 98.7 F (37.1 C)  TempSrc: Oral Oral Oral Oral  SpO2: 100% 95% 97% 91%  Weight:    74.8 kg  Height:        Intake/Output Summary (Last 24 hours) at 01/29/2019 1107 Last data filed at 01/29/2019 0622 Gross per 24 hour  Intake 250 ml  Output 650 ml  Net -400 ml   Last 3 Weights 01/29/2019 01/28/2019 01/27/2019  Weight (lbs) 164 lb 14.5 oz 165 lb 5.5 oz 165 lb 12.6 oz  Weight (kg) 74.8 kg 75 kg 75.2 kg      Telemetry    SR - Personally Reviewed  ECG    No new - Personally Reviewed  Physical Exam   GEN: No acute distress.   Neck: +  JVD to jaw when at 20 degrees, and increased SOB Cardiac: RRR, + systolic murmur, no rubs, or gallops.  Respiratory: Clear to auscultation bilaterally. GI: Soft, nontender,  non-distended  MS: No edema; No deformity. Neuro:  Nonfocal  Psych: Normal affect   Labs    High Sensitivity Troponin:   Recent Labs  Lab 01/23/19 1343 01/23/19 1555 01/23/19 2034  TROPONINIHS 10 11 13       Chemistry Recent Labs  Lab 01/23/19 1343 01/24/19 0428  01/27/19 0440 01/28/19 1012 01/29/19 0445  NA 137 138   < > 135 135 136  K 4.4 4.4   < > 4.6 4.5 4.0  CL 98 101   < > 97* 96* 99  CO2 29 30   < > 26 27 27   GLUCOSE 307* 128*   < > 92 113* 92  BUN 37* 35*   < > 46* 52* 50*  CREATININE 1.74* 1.65*   < > 2.40* 3.02* 2.70*  CALCIUM 9.1 9.0   < > 9.2 9.0 8.9  PROT 6.0* 5.4*  --   --   --   --   ALBUMIN 2.8* 2.6*  --   --   --   --   AST 14* 11*  --   --   --   --   ALT 11 10  --   --   --   --  ALKPHOS 65 58  --   --   --   --   BILITOT 0.3 0.4  --   --   --   --   GFRNONAA 30* 32*   < > 21* 16* 18*  GFRAA 35* 38*   < > 24* 18* 21*  ANIONGAP 10 7   < > 12 12 10    < > = values in this interval not displayed.     Hematology Recent Labs  Lab 01/23/19 1343 01/24/19 0428 01/27/19 0440  WBC 4.7 4.1 5.0  RBC 3.48* 3.47* 3.19*  HGB 9.5* 9.4* 8.7*  HCT 31.8* 31.8* 29.1*  MCV 91.4 91.6 91.2  MCH 27.3 27.1 27.3  MCHC 29.9* 29.6* 29.9*  RDW 14.9 14.6 14.8  PLT 207 215 199    BNP Recent Labs  Lab 01/23/19 1343  BNP 423.4*     DDimer No results for input(s): DDIMER in the last 168 hours.   Radiology    Ct Head Wo Contrast  Result Date: 01/28/2019 CLINICAL DATA:  63 year old female with altered mental status. EXAM: CT HEAD WITHOUT CONTRAST TECHNIQUE: Contiguous axial images were obtained from the base of the skull through the vertex without intravenous contrast. COMPARISON:  Head CT dated 04/25/2016 FINDINGS: Brain: The ventricles and sulci appropriate size for patient's age. Mild periventricular and deep white matter chronic microvascular ischemic changes noted. There is no acute intracranial hemorrhage. No mass effect or midline shift. No extra-axial  fluid collection. Vascular: No hyperdense vessel or unexpected calcification. Skull: Normal. Negative for fracture or focal lesion. Sinuses/Orbits: The visualized paranasal sinuses and the left mastoid air cells are clear. Right mastoid effusions noted. Other: None IMPRESSION: 1. No acute intracranial hemorrhage. 2. Mild chronic microvascular ischemic changes. 3. Right mastoid effusions. Electronically Signed   By: Anner Crete M.D.   On: 01/28/2019 02:37   US Renal  Result Date: 01/28/2019 CLINICAL DATA:  63 year old female with acute renal insufficiency. History of diabetes and hypertension. Bladder surgery. EXAM: RENAL / URINARY TRACT ULTRASOUND COMPLETE COMPARISON:  Renal ultrasound dated 11/29/2017 FINDINGS: Right Kidney: Renal measurements: 11.3 x 5.6 x 5.3 cm = volume: 175 mL. Normal echogenicity. No hydronephrosis or shadowing stone. Left Kidney: Renal measurements: 10.3 x 4.7 x 6.0 cm = volume: 152 mL. Normal echogenicity. No hydronephrosis or shadowing stone. Bladder: The urinary bladder is decompressed around a Foley catheter. Other: None IMPRESSION: Unremarkable renal ultrasound. Electronically Signed   By: Anner Crete M.D.   On: 01/28/2019 19:50    Cardiac Studies   ECHO 01/24/19 IMPRESSIONS    1. Left ventricular ejection fraction, by visual estimation, is 40 to 45%. The left ventricle has normal function. There is moderately increased left ventricular hypertrophy.  2. Global hypokinesis, worse apically.  3. Left ventricular diastolic Doppler parameters are consistent with restrictive filling pattern of LV diastolic filling.  4. Elevated left atrial and left ventricular end-diastolic pressures.  5. Global right ventricle has mildly reduced systolic function.The right ventricular size is normal. No increase in right ventricular wall thickness.  6. Left atrial size was normal.  7. Right atrial size was normal.  8. Moderate pleural effusion in the left lateral region.  9.  The mitral valve is abnormal. Mild mitral valve regurgitation. 10. The tricuspid valve is grossly normal. Tricuspid valve regurgitation is mild. 11. The aortic valve is tricuspid Aortic valve regurgitation is trivial by color flow Doppler. Mild aortic valve sclerosis without stenosis. 12. The pulmonic valve was grossly normal. Pulmonic valve regurgitation  is trivial by color flow Doppler. 13. Mildly elevated pulmonary artery systolic pressure. 14. The inferior vena cava is normal in size with <50% respiratory variability, suggesting right atrial pressure of 8 mmHg.  FINDINGS  Left Ventricle: Left ventricular ejection fraction, by visual estimation, is 40 to 45%. The left ventricle has normal function. There is moderately increased left ventricular hypertrophy. Spectral Doppler shows Left ventricular diastolic Doppler  parameters are consistent with restrictive filling pattern of LV diastolic filling. Elevated left atrial and left ventricular end-diastolic pressures.  Right Ventricle: The right ventricular size is normal. No increase in right ventricular wall thickness. Global RV systolic function is has mildly reduced systolic function. The tricuspid regurgitant velocity is 2.74 m/s, and with an assumed right atrial  pressure of 8 mmHg, the estimated right ventricular systolic pressure is mildly elevated at 38.1 mmHg.  Left Atrium: Left atrial size was normal in size.  Right Atrium: Right atrial size was normal in size  Pericardium: There is no evidence of pericardial effusion. There is a moderate pleural effusion in the left lateral region.  Mitral Valve: The mitral valve is abnormal. There is mild thickening of the mitral valve leaflet(s). Mild mitral valve regurgitation.  Tricuspid Valve: The tricuspid valve is grossly normal. Tricuspid valve regurgitation is mild by color flow Doppler.  Aortic Valve: The aortic valve is tricuspid. Aortic valve regurgitation is trivial by color  flow Doppler. Mild aortic valve sclerosis is present, with no evidence of aortic valve stenosis. Aortic valve peak gradient measures 13.4 mmHg.  Pulmonic Valve: The pulmonic valve was grossly normal. Pulmonic valve regurgitation is trivial by color flow Doppler.  Aorta: The aortic root and ascending aorta are structurally normal, with no evidence of dilitation.  Venous: The inferior vena cava is normal in size with less than 50% respiratory variability, suggesting right atrial pressure of 8 mmHg.  IAS/Shunts: No atrial level shunt detected by color flow Doppler.     LEFT VENTRICLE PLAX 2D LVIDd:         5.40 cm  Diastology LVIDs:         4.40 cm  LV e' medial:   4.03 cm/s LV PW:         1.10 cm  LV E/e' medial: 27.3 LV IVS:        1.10 cm LVOT diam:     1.90 cm LV SV:         54 ml LV SV Index:   29.57 LVOT Area:     2.84 cm    RIGHT VENTRICLE RV Basal diam:  2.70 cm RV S prime:     9.15 cm/s TAPSE (M-mode): 2.2 cm  LEFT ATRIUM             Index LA diam:        3.90 cm 2.27 cm/m LA Vol (A2C):   34.2 ml 19.95 ml/m LA Vol (A4C):   47.4 ml 27.64 ml/m LA Biplane Vol: 40.8 ml 23.79 ml/m  AORTIC VALVE AV Area (Vmax): 1.48 cm AV Vmax:        183.00 cm/s AV Peak Grad:   13.4 mmHg LVOT Vmax:      95.30 cm/s LVOT Vmean:     65.300 cm/s LVOT VTI:       0.206 m   AORTA Ao Root diam: 2.70 cm  MITRAL VALVE                         TRICUSPID VALVE  MV Area (PHT): 4.89 cm              TR Peak grad:   30.1 mmHg MV PHT:        44.95 msec            TR Vmax:        324.00 cm/s MV Decel Time: 155 msec MV E velocity: 110.00 cm/s 103 cm/s  SHUNTS MV A velocity: 45.80 cm/s  70.3 cm/s Systemic VTI:  0.21 m MV E/A ratio:  2.40        1.5       Systemic Diam: 1.90 cm     Patient Profile     63 y.o. female with a history of normal coronary arteries on cath in 2018, possible stress-induced cardiomyopathy, hypertension, hyperlipidemia, and prior CVA who was admitted on  01/23/2019 for acute on chronic combine CHF after presenting with worsening dyspnea, orthopnea, PND, lower extremity and weight gain. Cardiology consulted for assistance. Patient has also been complaining of significant abdominal pain and nausea for which GI is now following.  Assessment & Plan    Acute on Chronic Combined CHF  - Echo this admission showed showed LVEF of 40-45% (slightly down from 50% in 10/2018)global hypokinesis (worse apically)andmoderate LVH with grade 3 diastolic dysfunction and elevated left atrial and left ventricular end-diastolic pressures. Also noted to have mildly elevated PASP and moderate left pleural effusion. - Patient initially diuresed with IV Lasix but this has been held since 10/23 due to rise in creatinine. Net 750.3 L neg since admission. Creatinine continued to rise since holding Lasix. yesterday creat 3.02 and no acute HF - foley placed and Cr down to 2.70 today - Continue to hold diuretics for now, though increased orthopnea and + JVD  - Continue Coreg 12.5mg  twice daily and Bidil 20-37.5mg  three times daily. - wt down from 75Kg to 74.8 and neg 750cc since admit   Abdominal Pain and Nausea - Abdominal CT showed no acute findings to account for symptoms. - GI consulted. EGD yesterday showed hemorrhagic gastritis - biopsies were taken. No evidence of ulcer disease or active bleeding. Continue IV PPI twice daily was recommended throughout hospitalization. GI has now signed off. IM following  Hypertension  - BP well controlled at 132/57 to 119/50 .  - Continue Coreg and Bidil as above.  Hyperlipidemia  - Continue home statin.  Acute on Chronic Kidney Disease Stage III  - Creatinine down from yesterday. 1.65 >> 1.95 >> 2.21 >> 2.40. >> 3.02  Today 2.70 - pt with urinary retention and with foley now improved. - Continue to avoid nephrotoxic agents. - Daily BMET. - Dr. Melvia Heaps has seen ordered foley. -renal ultrasound unremarkable   UTI - Urinalysis  this morning consistent with UTI. Urine culture pending. - Started on Rocephin. per IM  Anemia - Hemoglobin 8.7 on the 25th will recheck  - No active bleeding on EGD. - Continue PPI. - Management per primary team.    Cardiac cath 04/25/16 with normal coronary arteries and normal LV function but by echo 30-35%;  2019 EF 50-55% and 10/2018, now EF 40-45%.   For questions or updates, please contact [place] Please consult [email or web address] for contact info under        Signed, Cecilie Kicks, NP  01/29/2019, 11:07 AM    History and all data above reviewed.  Patient examined.  I agree with the findings as above. She had a cough last night and did not sleep.  She  is not bringing anything up with the cough.   The patient exam reveals COR:RRR  ,  Lungs: Clear  ,  Abd: Positive bowel sounds, no rebound no guarding, Ext No edema  .  All available labs, radiology testing, previous records reviewed. Agree with documented assessment and plan.   SOB:  No evidence of volume overload.  Holding all diuretics and potential nephrotoxins.  We will follow as needed.   Jeneen Rinks Geronimo Diliberto  1:53 PM  01/29/2019

## 2019-01-29 NOTE — Progress Notes (Signed)
PROGRESS NOTE    Kara Hanson  ZTI:[id number] DOB: [birth year] DOA: 01/23/2019 PCP: [name], MD   Brief Narrative:  Patient is a 63 female with history of congestive heart failure with action fraction 50% as per echo on 11/01/2018 who presented with shortness of breath, dyspnea on exertion, orthopnea. She is on 4 L of oxygen at home 24/7 for her chronic hypoxia. On presentation she was found to be in acute on chronic CHF. She had bilateral basal crackles, bilateral lower extremity edema and elevated JVD. Echocardiogram done here today showed ejection fraction of 40 to 45%. She was started onIV Lasix. Cardiology following.Patient also complained of persistent nausea, vomiting, abdominal pain, not tolerating diet. GI consulted. Plan for EGD tomorrow.   Assessment & Plan:   Active Problems:   Hyperlipidemia LDL goal <70   Acute on chronic combined systolic and diastolic CHF (congestive heart failure) (HCC)   Controlled type 2 diabetes with neuropathy (HCC)   CHF (congestive heart failure) (HCC)   Epigastric pain   Acute renal failure superimposed on stage 3 chronic kidney disease ([place])   Acute on chronic systolic CHF:As previously noted, presented with dyspnea on minimal exertion, orthopnea. Had bilateral lower extremity edema, basal crackles and elevated JVD on presentation. Elevated BNP. echocardiogram showed decrease ejection fraction to 40 to 45%. Started onLasix 60 mg twice a day but this has been held 3 days. Continue input and output monitoring, daily weight. Fluid restriction to less than 1.5 L a day. Salt restriction to less than 2 g a day. Cardiology following. Currently on carvedilol and BiDil. CT abdomen/pelvis done yesterday showed bilateral moderate pleural effusion. Patient denied thoracentesis. Since creatinine bumped up,no change-CONTINUE to held Lasix.  AKI: Baseline creatinine ranges from 1.3-1.6. 3.02>2.7, not consistent with a pure  overdiuresis, appreciate neph consult,   Abdominal discomfort/painwith erosive gastritis:Patient complained of persistent nausea, vomiting, abdominal pain, not tolerating diet. GI consulted.EGD showed erosive gastritis without acute bleeding ulcer CT abdomen/pelvis did not show any acute intra-abdominal abnormalities. Continue Protonixalthough currently 40 IV twice daily started today patient's abdominal pain improving, .   Diabetes type 2: Recent hemoglobin A1c of 7.8. On insulin at home. Continue current insulin regimen here. On Neurontin and Cymbalta for diabetic neuropathy.  Hypertension:Currently blood pressure stable. Continue current medicines  Hyperlipidemia: Continue better  Gout:On allopurinol  Deconditioning/debility: I have requested for PT evaluation.  DVT prophylaxis: Lovenox SQ  Code Status: Full    Code Status Orders  (From admission, onward)         Start     Ordered   01/23/19 1757  Full code  Continuous     01/23/19 1757        Code Status History    Date Active Date Inactive Code Status Order ID Comments User Context   11/01/2018 0029 11/05/2018 2014 Full Code [id number]  Sid Falcon, MD ED   09/24/2018 2303 10/02/2018 1600 Full Code [id number]  Elwyn Reach, MD Inpatient   07/20/2018 1655 07/26/2018 1800 Full Code [id number]  Debbe Odea, MD ED   03/20/2018 0402 03/24/2018 1949 Full Code [id number]  Norval Morton, MD ED   11/27/2017 0705 11/30/2017 1625 Full Code [id number]  Radene Gunning, NP ED   10/01/2016 0033 10/01/2016 1946 Full Code [id number]  Maryellen Pile, MD ED   06/17/2016 1720 06/18/2016 2022 Full Code [id number]  Reyne Dumas, MD ED   04/23/2016 0418 04/26/2016 1716 Full Code [id number]  Rise Patience, MD ED   Advance Care  Planning Activity     Family Communication: spoke with daughter  Disposition Plan:   Patient remained inpatient for continued evaluation for acute kidney injury, nitrite abnormalities.  Patient is  not yet ready for discharge medically Consults called: None Admission status: Inpatient   Consultants:   GI RENAL  Procedures:  Ct Abdomen Pelvis Wo Contrast  Result Date: 01/24/2019 CLINICAL DATA:  63 year old female with history of abdominal distension. Epigastric abdominal pain. EXAM: CT ABDOMEN AND PELVIS WITHOUT CONTRAST TECHNIQUE: Multidetector CT imaging of the abdomen and pelvis was performed following the standard protocol without IV contrast. COMPARISON:  CT the abdomen and pelvis 01/02/2018. FINDINGS: Lower chest: Moderate bilateral pleural effusions with areas of atelectasis and/or consolidation in the lower lobes of the lungs bilaterally. Atherosclerotic calcifications in the thoracic aorta as well as the left main, left anterior descending, left circumflex and right coronary arteries. Calcifications of the aortic valve. Cardiomegaly. Hepatobiliary: No suspicious cystic or solid hepatic lesions are confidently identified on today's noncontrast CT examination. Status post cholecystectomy. Pancreas: Fatty atrophy in the pancreas. No discrete pancreatic mass, no definite pancreatic mass or peripancreatic fluid collections or inflammatory changes are noted on today's noncontrast CT examination. Spleen: Unremarkable. Adrenals/Urinary Tract: Unenhanced appearance of the kidneys and bilateral adrenal glands is unremarkable. No hydroureteronephrosis. Urinary bladder is unremarkable in appearance. Stomach/Bowel: Unenhanced appearance of the stomach is normal. There is no pathologic dilatation of small bowel or colon. Multiple scattered colonic diverticulae are noted, without surrounding inflammatory changes to suggest an acute diverticulitis at this time. Normal appendix. Vascular/Lymphatic: Aortic atherosclerosis. Retroaortic left renal vein (normal anatomical variant) incidentally noted. No lymphadenopathy identified in the abdomen or pelvis. Reproductive: Unenhanced appearance of the uterus and  ovaries is unremarkable. Other: Small umbilical hernia containing only omental fat. No significant volume of ascites. No pneumoperitoneum. Musculoskeletal: There are no aggressive appearing lytic or blastic lesions noted in the visualized portions of the skeleton. IMPRESSION: 1. No acute findings are noted in the abdomen or pelvis to account for the patient's symptoms. 2. Moderate bilateral pleural effusions with dependent areas of atelectasis and/or consolidation in the lower lobes of the lungs bilaterally. 3. Colonic diverticulosis without evidence of acute diverticulitis at this time. 4. Small umbilical hernia containing only omental fat. No associated bowel incarceration or obstruction at this time. Electronically Signed   By: Vinnie Langton M.D.   On: 01/24/2019 18:09   Ct Head Wo Contrast  Result Date: 01/28/2019 CLINICAL DATA:  63 year old female with altered mental status. EXAM: CT HEAD WITHOUT CONTRAST TECHNIQUE: Contiguous axial images were obtained from the base of the skull through the vertex without intravenous contrast. COMPARISON:  Head CT dated 04/25/2016 FINDINGS: Brain: The ventricles and sulci appropriate size for patient's age. Mild periventricular and deep white matter chronic microvascular ischemic changes noted. There is no acute intracranial hemorrhage. No mass effect or midline shift. No extra-axial fluid collection. Vascular: No hyperdense vessel or unexpected calcification. Skull: Normal. Negative for fracture or focal lesion. Sinuses/Orbits: The visualized paranasal sinuses and the left mastoid air cells are clear. Right mastoid effusions noted. Other: None IMPRESSION: 1. No acute intracranial hemorrhage. 2. Mild chronic microvascular ischemic changes. 3. Right mastoid effusions. Electronically Signed   By: Anner Crete M.D.   On: 01/28/2019 02:37   US Renal  Result Date: 01/28/2019 CLINICAL DATA:  63 year old female with acute renal insufficiency. History of diabetes and  hypertension. Bladder surgery. EXAM: RENAL / URINARY TRACT ULTRASOUND COMPLETE COMPARISON:  Renal ultrasound dated 11/29/2017 FINDINGS: Right Kidney:  Renal measurements: 11.3 x 5.6 x 5.3 cm = volume: 175 mL. Normal echogenicity. No hydronephrosis or shadowing stone. Left Kidney: Renal measurements: 10.3 x 4.7 x 6.0 cm = volume: 152 mL. Normal echogenicity. No hydronephrosis or shadowing stone. Bladder: The urinary bladder is decompressed around a Foley catheter. Other: None IMPRESSION: Unremarkable renal ultrasound. Electronically Signed   By: Anner Crete M.D.   On: 01/28/2019 19:50   Dg Chest Port 1 View  Result Date: 01/23/2019 CLINICAL DATA:  63 year old female with shortness of breath. On home oxygen. EXAM: PORTABLE CHEST 1 VIEW COMPARISON:  10/31/2018 and earlier. FINDINGS: Portable AP semi upright view at 1509 hours. Stable lung volumes and mediastinal contours. Visualized tracheal air column is within normal limits. Further increased bilateral interstitial and lung base opacity, with obscuration of the diaphragm now. No superimposed pneumothorax. No definite air bronchograms. Paucity bowel gas in the upper abdomen. No acute osseous abnormality identified. IMPRESSION: Increasing bilateral lung opacity favored due to acute pulmonary edema with lung base atelectasis and possibly small pleural effusions. Bilateral lower lobe pneumonia felt less likely. Electronically Signed   By: Genevie Ann M.D.   On: 01/23/2019 15:18     Antimicrobials:   NONE    Subjective: Patient doing well, Reports feeling short of breath this morning although speaking in full sentences, x-rays pending  Objective: Vitals:   01/28/19 2124 01/28/19 2200 01/29/19 0607 01/29/19 1108  BP: (!) 134/54 137/64 (!) 132/57 (!) 119/50  Pulse: 80 81 80 78  Resp: 18 18 20 20   Temp: 98.2 F (36.8 C) 98.7 F (37.1 C) 98.7 F (37.1 C) 98.8 F (37.1 C)  TempSrc: Oral Oral Oral Oral  SpO2: 95% 97% 91% 92%  Weight:   74.8 kg     Height:        Intake/Output Summary (Last 24 hours) at 01/29/2019 1414 Last data filed at 01/29/2019 0945 Gross per 24 hour  Intake 170 ml  Output 650 ml  Net -480 ml   Filed Weights   01/27/19 0632 01/28/19 0500 01/29/19 0607  Weight: 75.2 kg 75 kg 74.8 kg    Examination:  General exam: Appears calm and comfortable  Respiratory system: Clear to auscultation. Respiratory effort normal. Cardiovascular system: S1 & S2 heard, RRR. No JVD, murmurs, rubs, gallops or clicks. No pedal edema. Gastrointestinal system: Abdomen is nondistended, soft and nontender. No organomegaly or masses felt. Normal bowel sounds heard. Central nervous system: Alert and oriented.  Has an intention tremor otherwise no acute changes this is chronic. Extremities: Warm well perfused, no edema Skin: No rashes, lesions mmetric 5 x 5 power.or ulcers Psychiatry: Judgement and insight appear normal. Mood & affect appropriate.     Data Reviewed: I have personally reviewed following labs and imaging studies  CBC: Recent Labs  Lab 01/23/19 1343 01/24/19 0428 01/27/19 0440  WBC 4.7 4.1 5.0  NEUTROABS 3.7  --  4.0  HGB 9.5* 9.4* 8.7*  HCT 31.8* 31.8* 29.1*  MCV 91.4 91.6 91.2  PLT 207 215 128   Basic Metabolic Panel: Recent Labs  Lab 01/25/19 0418 01/26/19 0507 01/27/19 0440 01/28/19 1012 01/29/19 0445  NA 136 137 135 135 136  K 5.2* 4.8 4.6 4.5 4.0  CL 99 97* 97* 96* 99  CO2 30 30 26 27 27   GLUCOSE 99 105* 92 113* 92  BUN 41* 44* 46* 52* 50*  CREATININE 1.95* 2.21* 2.40* 3.02* 2.70*  CALCIUM 8.9 9.2 9.2 9.0 8.9   GFR: Estimated Creatinine Clearance: 18.8  mL/min (A) (by C-G formula based on SCr of 2.7 mg/dL (H)). Liver Function Tests: Recent Labs  Lab 01/23/19 1343 01/24/19 0428  AST 14* 11*  ALT 11 10  ALKPHOS 65 58  BILITOT 0.3 0.4  PROT 6.0* 5.4*  ALBUMIN 2.8* 2.6*   Recent Labs  Lab 01/26/19 1209  LIPASE 20   No results for input(s): AMMONIA in the last 168  hours. Coagulation Profile: No results for input(s): INR, PROTIME in the last 168 hours. Cardiac Enzymes: No results for input(s): CKTOTAL, CKMB, CKMBINDEX, TROPONINI in the last 168 hours. BNP (last 3 results) No results for input(s): PROBNP in the last 8760 hours. HbA1C: No results for input(s): HGBA1C in the last 72 hours. CBG: Recent Labs  Lab 01/28/19 1200 01/28/19 1605 01/28/19 2131 01/29/19 0722 01/29/19 1106  GLUCAP 96 118* 90 81 81   Lipid Profile: No results for input(s): CHOL, HDL, LDLCALC, TRIG, CHOLHDL, LDLDIRECT in the last 72 hours. Thyroid Function Tests: No results for input(s): TSH, T4TOTAL, FREET4, T3FREE, THYROIDAB in the last 72 hours. Anemia Panel: No results for input(s): VITAMINB12, FOLATE, FERRITIN, TIBC, IRON, RETICCTPCT in the last 72 hours. Sepsis Labs: No results for input(s): PROCALCITON, LATICACIDVEN in the last 168 hours.  Recent Results (from the past 240 hour(s))  SARS CORONAVIRUS 2 (TAT 6-24 HRS) Nasopharyngeal Nasopharyngeal Swab     Status: None   Collection Time: 01/23/19  3:55 PM   Specimen: Nasopharyngeal Swab  Result Value Ref Range Status   SARS Coronavirus 2 NEGATIVE NEGATIVE Final    Comment: (NOTE) SARS-CoV-2 target nucleic acids are NOT DETECTED. The SARS-CoV-2 RNA is generally detectable in upper and lower respiratory specimens during the acute phase of infection. Negative results do not preclude SARS-CoV-2 infection, do not rule out co-infections with other pathogens, and should not be used as the sole basis for treatment or other patient management decisions. Negative results must be combined with clinical observations, patient history, and epidemiological information. The expected result is Negative. Fact Sheet for Patients: SugarRoll.be Fact Sheet for Healthcare Providers: https://www.woods-mathews.com/ This test is not yet approved or cleared by the [place] FDA and  has  been authorized for detection and/or diagnosis of SARS-CoV-2 by FDA under an Emergency Use Authorization (EUA). This EUA will remain  in effect (meaning this test can be used) for the duration of the COVID-19 declaration under Section 56 4(b)(1) of the Act, 21 U.S.C. section 360bbb-3(b)(1), unless the authorization is terminated or revoked sooner. Performed at [facility], [place] [address], [place], [place] [address]   Culture, Urine     Status: Abnormal   Collection Time: 01/28/19  8:20 AM   Specimen: Urine, Random  Result Value Ref Range Status   Specimen Description   Final    URINE, RANDOM Performed at [facility] [address], [place], [place] [address]    Special Requests   Final    NONE Performed at [facility], [place] [address] [place], [place] [address]    Culture MULTIPLE SPECIES PRESENT, SUGGEST RECOLLECTION (A)  Final   Report Status 01/29/2019 FINAL  Final         Radiology Studies: Ct Head Wo Contrast  Result Date: 01/28/2019 CLINICAL DATA:  63 year old female with altered mental status. EXAM: CT HEAD WITHOUT CONTRAST TECHNIQUE: Contiguous axial images were obtained from the base of the skull through the vertex without intravenous contrast. COMPARISON:  Head CT dated 04/25/2016 FINDINGS: Brain: The ventricles and sulci appropriate  size for patient's age. Mild periventricular and deep white matter chronic microvascular ischemic changes noted. There is no acute intracranial hemorrhage. No mass effect or midline shift. No extra-axial fluid collection. Vascular: No hyperdense vessel or unexpected calcification. Skull: Normal. Negative for fracture or focal lesion. Sinuses/Orbits: The visualized paranasal sinuses and the left mastoid air cells are clear. Right mastoid effusions noted. Other: None IMPRESSION: 1. No acute intracranial hemorrhage. 2. Mild chronic microvascular ischemic changes. 3. Right mastoid effusions.  Electronically Signed   By: Anner Crete M.D.   On: 01/28/2019 02:37   US Renal  Result Date: 01/28/2019 CLINICAL DATA:  63 year old female with acute renal insufficiency. History of diabetes and hypertension. Bladder surgery. EXAM: RENAL / URINARY TRACT ULTRASOUND COMPLETE COMPARISON:  Renal ultrasound dated 11/29/2017 FINDINGS: Right Kidney: Renal measurements: 11.3 x 5.6 x 5.3 cm = volume: 175 mL. Normal echogenicity. No hydronephrosis or shadowing stone. Left Kidney: Renal measurements: 10.3 x 4.7 x 6.0 cm = volume: 152 mL. Normal echogenicity. No hydronephrosis or shadowing stone. Bladder: The urinary bladder is decompressed around a Foley catheter. Other: None IMPRESSION: Unremarkable renal ultrasound. Electronically Signed   By: Anner Crete M.D.   On: 01/28/2019 19:50        Scheduled Meds:  allopurinol  150 mg Oral Daily   aspirin EC  81 mg Oral Daily   atorvastatin  80 mg Oral q1800   carvedilol  12.5 mg Oral BID WC   Chlorhexidine Gluconate Cloth  6 each Topical Daily   DULoxetine  60 mg Oral Daily   enoxaparin (LOVENOX) injection  30 mg Subcutaneous Q24H   gabapentin  300 mg Oral BID   insulin aspart  0-15 Units Subcutaneous TID WC   insulin aspart  0-5 Units Subcutaneous QHS   insulin glargine  10 Units Subcutaneous QHS   isosorbide-hydrALAZINE  1 tablet Oral TID   pantoprazole (PROTONIX) IV  40 mg Intravenous Q12H   Continuous Infusions:  cefTRIAXone (ROCEPHIN)  IV 1 g (01/29/19 0924)     LOS: 6 days    Time spent: [place]    Nicolette Bang, MD [facility]  If 7PM-7AM, please contact night-coverage  01/29/2019, 2:14 PM

## 2019-01-29 NOTE — Progress Notes (Signed)
[facility] Progress Note  Subjective: no new c/o', good UOP overnight, foley placed after "retention' noted per RN (600 cc)  Vitals:   01/28/19 2124 01/28/19 2200 01/29/19 0607 01/29/19 1108  BP: (!) 134/54 137/64 (!) 132/57 (!) 119/50  Pulse: 80 81 80 78  Resp: 18 18 20 20   Temp: 98.2 F (36.8 C) 98.7 F (37.1 C) 98.7 F (37.1 C) 98.8 F (37.1 C)  TempSrc: Oral Oral Oral Oral  SpO2: 95% 97% 91% 92%  Weight:   74.8 kg   Height:        Inpatient medications: . allopurinol  150 mg Oral Daily  . aspirin EC  81 mg Oral Daily  . atorvastatin  80 mg Oral q1800  . carvedilol  12.5 mg Oral BID WC  . Chlorhexidine Gluconate Cloth  6 each Topical Daily  . DULoxetine  60 mg Oral Daily  . enoxaparin (LOVENOX) injection  30 mg Subcutaneous Q24H  . gabapentin  300 mg Oral BID  . insulin aspart  0-15 Units Subcutaneous TID WC  . insulin aspart  0-5 Units Subcutaneous QHS  . insulin glargine  10 Units Subcutaneous QHS  . isosorbide-hydrALAZINE  1 tablet Oral TID  . pantoprazole (PROTONIX) IV  40 mg Intravenous Q12H   . cefTRIAXone (ROCEPHIN)  IV Stopped (01/29/19 1012)   ondansetron (ZOFRAN) IV    Exam: Gen tremulous UE's, not new per pt, no distress, groggy, 3L [place] O2 No rash, cyanosis or gangrene Sclera anicteric, throat clear  No jvd or bruits Chest clear bilat to bases, no rales or wheezing RRR no MRG Abd soft ntnd no mass or ascites +bs GU defer MS no joint effusions or deformity Ext no LE or UE edema, no wounds or ulcers Neuro is alert, Ox 3 , nf  I/O yest 350 in / 1200 UOP after foley placed BUN 50, cr 2.70 today  Assessment/ Plan: 1. AKI on CKD III - baseline creat 1.1- 1.5.  Creat goes up w/ diuresis typically but not this high. Urinary retention may have been the main issue, creat is down w/ foley in and UOP up. Diuresis of course may also have contributed. Lasix had been stopped already.  Would cont foley drainage for now, f/u creat in am. Will  follow.  2. HTN - ok to continue bidil and coreg, holding orders written. Keep BP > 110 3. Acute/chronic CHF - sp diuresis here, SOB resolved, is on home O2 dose of 3-4 L [place] 4. Abd pain - EGD showed hemorrhagic gastritis, no active bleeding. PPI Rx 5. DM2 6. H/ o CVA   Rob Kensi Karr 01/29/2019, 4:45 PM  Iron/TIBC/Ferritin/ %Sat    Component Value Date/Time   FERRITIN 77 10/31/2018 2056   Recent Labs  Lab 01/24/19 0428  01/29/19 0445  NA 138   < > 136  K 4.4   < > 4.0  CL 101   < > 99  CO2 30   < > 27  GLUCOSE 128*   < > 92  BUN 35*   < > 50*  CREATININE 1.65*   < > 2.70*  CALCIUM 9.0   < > 8.9  ALBUMIN 2.6*  --   --    < > = values in this interval not displayed.   Recent Labs  Lab 01/24/19 0428  AST 11*  ALT 10  ALKPHOS 58  BILITOT 0.4  PROT 5.4*   Recent Labs  Lab 01/27/19 0440  WBC 5.0  HGB 8.7*  HCT 29.1*  PLT 199

## 2019-01-29 NOTE — Progress Notes (Signed)
Pt sleepy this am, able to take PO meds, and sips of liquids. Pt refused breakfast. Pt awake at times then sleepy, more lethargic this am. Will continue to monitor. SRP, RN

## 2019-01-30 ENCOUNTER — Inpatient Hospital Stay (HOSPITAL_COMMUNITY): Payer: Medicare Other

## 2019-01-30 ENCOUNTER — Other Ambulatory Visit: Payer: Medicare Other

## 2019-01-30 LAB — BODY FLUID CELL COUNT WITH DIFFERENTIAL
Eos, Fluid: 0 %
Lymphs, Fluid: 45 %
Monocyte-Macrophage-Serous Fluid: 51 % (ref 50–90)
Neutrophil Count, Fluid: 4 % (ref 0–25)
Total Nucleated Cell Count, Fluid: 244 cu mm (ref 0–1000)

## 2019-01-30 LAB — CBC
HCT: 30.1 % — ABNORMAL LOW (ref 36.0–46.0)
Hemoglobin: 8.9 g/dL — ABNORMAL LOW (ref 12.0–15.0)
MCH: 27.1 pg (ref 26.0–34.0)
MCHC: 29.6 g/dL — ABNORMAL LOW (ref 30.0–36.0)
MCV: 91.8 fL (ref 80.0–100.0)
Platelets: 235 10*3/uL (ref 150–400)
RBC: 3.28 MIL/uL — ABNORMAL LOW (ref 3.87–5.11)
RDW: 14.7 % (ref 11.5–15.5)
WBC: 5.6 10*3/uL (ref 4.0–10.5)
nRBC: 0 % (ref 0.0–0.2)

## 2019-01-30 LAB — BASIC METABOLIC PANEL
Anion gap: 9 (ref 5–15)
BUN: 47 mg/dL — ABNORMAL HIGH (ref 8–23)
CO2: 27 mmol/L (ref 22–32)
Calcium: 8.8 mg/dL — ABNORMAL LOW (ref 8.9–10.3)
Chloride: 101 mmol/L (ref 98–111)
Creatinine, Ser: 2.2 mg/dL — ABNORMAL HIGH (ref 0.44–1.00)
GFR calc Af Amer: 27 mL/min — ABNORMAL LOW (ref >60)
GFR calc non Af Amer: 23 mL/min — ABNORMAL LOW (ref >60)
Glucose, Bld: 85 mg/dL (ref 70–99)
Potassium: 3.7 mmol/L (ref 3.5–5.1)
Sodium: 137 mmol/L (ref 135–145)

## 2019-01-30 LAB — GLUCOSE, CAPILLARY
Glucose-Capillary: 73 mg/dL (ref 70–99)
Glucose-Capillary: 73 mg/dL (ref 70–99)
Glucose-Capillary: 94 mg/dL (ref 70–99)
Glucose-Capillary: 104 mg/dL — ABNORMAL HIGH (ref 70–99)

## 2019-01-30 LAB — GRAM STAIN

## 2019-01-30 LAB — PROTEIN, PLEURAL OR PERITONEAL FLUID: Total protein, fluid: <3 g/dL

## 2019-01-30 MED ORDER — FUROSEMIDE 10 MG/ML IJ SOLN
40.0000 mg | Freq: Three times a day (TID) | INTRAMUSCULAR | Status: AC
  Administered 2019-01-30 (×2): 40 mg via INTRAVENOUS
  Filled 2019-01-30 (×2): qty 4

## 2019-01-30 MED ORDER — LIDOCAINE HCL 1 % IJ SOLN
INTRAMUSCULAR | Status: AC
  Filled 2019-01-30: qty 20

## 2019-01-30 MED ORDER — FUROSEMIDE 10 MG/ML IJ SOLN: 20.0000 mg | Freq: Once | INTRAMUSCULAR | Status: DC

## 2019-01-30 NOTE — Progress Notes (Signed)
[facility] Progress Note  Subjective: UOP down 300 cc  Yest, but creat continues to imrpove to 2.3 today.   Vitals:   01/30/19 0429 01/30/19 1215 01/30/19 1450 01/30/19 1537  BP: 136/65 (!) 138/59 (!) 154/65   Pulse: 74  72   Resp: 20  18   Temp: 97.8 F (36.6 C)  97.8 F (36.6 C)   TempSrc: Oral  Oral   SpO2: 90%  93% (!) 89%  Weight: 74.8 kg     Height:        Inpatient medications: . allopurinol  150 mg Oral Daily  . aspirin EC  81 mg Oral Daily  . atorvastatin  80 mg Oral q1800  . carvedilol  12.5 mg Oral BID WC  . Chlorhexidine Gluconate Cloth  6 each Topical Daily  . DULoxetine  60 mg Oral Daily  . enoxaparin (LOVENOX) injection  30 mg Subcutaneous Q24H  . furosemide  20 mg Intravenous Once  . gabapentin  300 mg Oral BID  . insulin aspart  0-15 Units Subcutaneous TID WC  . insulin aspart  0-5 Units Subcutaneous QHS  . insulin glargine  10 Units Subcutaneous QHS  . isosorbide-hydrALAZINE  1 tablet Oral TID  . lidocaine      . pantoprazole (PROTONIX) IV  40 mg Intravenous Q12H   . cefTRIAXone (ROCEPHIN)  IV 1 g (01/30/19 1109)   ondansetron (ZOFRAN) IV    Exam: Gen much more alert, no distress , nasal O2 Chest clear on R, L dec'd RRR no MRG Abd soft ntnd no mass or ascites +bs GU foley in place w/ amber urine , clear MS no joint effusions or deformity Ext no LE or UE edema Neuro is alert, Ox 3 , nf   CXR 10/28 > large L effusion, no pulm edema otherwise  Assessment/ Plan: 1. AKI on CKD III - baseline creat 1.1- 1.5.  Creat bump to 3.0 prob due to urinary retention. Improving down to 2.3 today w foley in place.  Worsening CHF/ L effusion, has thoracentesis today. +jvd today, may still need diuresis > will resume IV lasix 40 x 2 tonight. See how she responds.  2. HTN - ok to continue bidil and coreg, holding orders written. Keep BP > 110 3. Acute/chronic resp failure - is on home O2 dose of 3-4 L [place] 4. Abd pain - EGD showed hemorrhagic  gastritis, no active bleeding. PPI Rx 5. DM2 6. H/ o CVA   Kara Hanson 01/30/2019, 5:11 PM  Iron/TIBC/Ferritin/ %Sat    Component Value Date/Time   FERRITIN 77 10/31/2018 2056   Recent Labs  Lab 01/24/19 0428  01/30/19 0420  NA 138   < > 137  K 4.4   < > 3.7  CL 101   < > 101  CO2 30   < > 27  GLUCOSE 128*   < > 85  BUN 35*   < > 47*  CREATININE 1.65*   < > 2.20*  CALCIUM 9.0   < > 8.8*  ALBUMIN 2.6*  --   --    < > = values in this interval not displayed.   Recent Labs  Lab 01/24/19 0428  AST 11*  ALT 10  ALKPHOS 58  BILITOT 0.4  PROT 5.4*   Recent Labs  Lab 01/30/19 0420  WBC 5.6  HGB 8.9*  HCT 30.1*  PLT 235

## 2019-01-30 NOTE — Procedures (Signed)
Ultrasound-guided diagnostic and therapeutic left thoracentesis performed yielding 270 cc of light yellow fluid. No immediate complications. Follow-up chest x-ray pending. The fluid was sen to the lab for preordered studies. EBL none.

## 2019-01-30 NOTE — Progress Notes (Signed)
PROGRESS NOTE    Kara Hanson  AJO:[id number] DOB: 1955/09/28 DOA: 01/23/2019 PCP: [name], MD   Brief Narrative:  Patient is a 1 female with history of congestive heart failure with action fraction 50% as per echo on 11/01/2018 who presented with shortness of breath, dyspnea on exertion, orthopnea. She is on 4 L of oxygen at home 24/7 for her chronic hypoxia. On presentation she was found to be in acute on chronic CHF. She had bilateral basal crackles, bilateral lower extremity edema and elevated JVD. Echocardiogram done here today showed ejection fraction of 40 to 45%. She was started onIV Lasix. Cardiology following.Patient also complained of persistent nausea, vomiting, abdominal pain, not tolerating diet. GI consulted. Plan for EGD tomorrow.   Assessment & Plan:   Active Problems:   Hyperlipidemia LDL goal <70   Acute on chronic combined systolic and diastolic CHF (congestive heart failure) (HCC)   Controlled type 2 diabetes with neuropathy (HCC)   CHF (congestive heart failure) (HCC)   Epigastric pain   Acute renal failure superimposed on stage 3 chronic kidney disease ([place])   Acute on chronic systolic CHF:As previously noted, presented with dyspnea on minimal exertion, orthopnea. Had bilateral lower extremity edema, basal crackles and elevated JVD on presentation. Elevated BNP. echocardiogram showed decrease ejection fraction to 40 to 45%. Started onLasix 60 mg twice a daybut this has been held. Continue input and output monitoring, daily weight. Fluid restriction to less than 1.5 L a day. Salt restriction to less than 2 g a day. Cardiology following. Currently on carvedilol and BiDil. CT abdomen/pelvis showed bilateral moderate pleural effusion.  Patient declined thoracentesis initially but agreed to it 10/28  Unfortunately repeat chest x-ray shows interstitial edema and pleural effusion   AKI: Baseline creatinine ranges from  1.3-1.6.3.02>2.7>2.2, not consistent with a pure overdiuresis, appreciate neph consult,  Abdominal discomfort/painwith erosive gastritis:Patient complained of persistent nausea, vomiting, abdominal pain, not tolerating diet. GI consulted.EGD showed erosive gastritis without acute bleeding ulcer CT abdomen/pelvis did not show any acute intra-abdominal abnormalities. Continue Protonixalthough currently 40 IV twice daily, patient's abdominal pain improving,  Diabetes type 2: Recent hemoglobin A1c of 7.8. On insulin at home. Continue current insulin regimen here. On Neurontin and Cymbalta for diabetic neuropathy.  Hypertension:Currently blood pressure stable. Continue current medicines  Hyperlipidemia: Continue better  Gout:On allopurinol  Deconditioning/debility: Patient continues to decline home health  DVT prophylaxis: Lovenox SQ  Code Status: full    Code Status Orders  (From admission, onward)         Start     Ordered   01/23/19 1757  Full code  Continuous     01/23/19 1757        Code Status History    Date Active Date Inactive Code Status Order ID Comments User Context   11/01/2018 0029 11/05/2018 2014 Full Code [id number]  Sid Falcon, MD ED   09/24/2018 2303 10/02/2018 1600 Full Code [id number]  Elwyn Reach, MD Inpatient   07/20/2018 1655 07/26/2018 1800 Full Code [id number]  Debbe Odea, MD ED   03/20/2018 0402 03/24/2018 1949 Full Code [id number]  Norval Morton, MD ED   11/27/2017 0705 11/30/2017 1625 Full Code [id number]  Radene Gunning, NP ED   10/01/2016 0033 10/01/2016 1946 Full Code [id number]  Maryellen Pile, MD ED   06/17/2016 1720 06/18/2016 2022 Full Code [id number]  Reyne Dumas, MD ED   04/23/2016 0418 04/26/2016 1716 Full Code [id number]  Rise Patience, MD ED   Advance Care  Planning Activity     Family Communication: None today Disposition Plan:   Patient remained inpatient for continued evaluation for acute kidney injury,  electolyte abnormalities, interstitial edema and pleural effusion.  Patient is not yet ready for discharge medically  Consults called: None Admission status: Inpatient   Consultants:   GI, renal  Procedures:  Ct Abdomen Pelvis Wo Contrast  Result Date: 01/24/2019 CLINICAL DATA:  63 year old female with history of abdominal distension. Epigastric abdominal pain. EXAM: CT ABDOMEN AND PELVIS WITHOUT CONTRAST TECHNIQUE: Multidetector CT imaging of the abdomen and pelvis was performed following the standard protocol without IV contrast. COMPARISON:  CT the abdomen and pelvis 01/02/2018. FINDINGS: Lower chest: Moderate bilateral pleural effusions with areas of atelectasis and/or consolidation in the lower lobes of the lungs bilaterally. Atherosclerotic calcifications in the thoracic aorta as well as the left main, left anterior descending, left circumflex and right coronary arteries. Calcifications of the aortic valve. Cardiomegaly. Hepatobiliary: No suspicious cystic or solid hepatic lesions are confidently identified on today's noncontrast CT examination. Status post cholecystectomy. Pancreas: Fatty atrophy in the pancreas. No discrete pancreatic mass, no definite pancreatic mass or peripancreatic fluid collections or inflammatory changes are noted on today's noncontrast CT examination. Spleen: Unremarkable. Adrenals/Urinary Tract: Unenhanced appearance of the kidneys and bilateral adrenal glands is unremarkable. No hydroureteronephrosis. Urinary bladder is unremarkable in appearance. Stomach/Bowel: Unenhanced appearance of the stomach is normal. There is no pathologic dilatation of small bowel or colon. Multiple scattered colonic diverticulae are noted, without surrounding inflammatory changes to suggest an acute diverticulitis at this time. Normal appendix. Vascular/Lymphatic: Aortic atherosclerosis. Retroaortic left renal vein (normal anatomical variant) incidentally noted. No lymphadenopathy identified  in the abdomen or pelvis. Reproductive: Unenhanced appearance of the uterus and ovaries is unremarkable. Other: Small umbilical hernia containing only omental fat. No significant volume of ascites. No pneumoperitoneum. Musculoskeletal: There are no aggressive appearing lytic or blastic lesions noted in the visualized portions of the skeleton. IMPRESSION: 1. No acute findings are noted in the abdomen or pelvis to account for the patient's symptoms. 2. Moderate bilateral pleural effusions with dependent areas of atelectasis and/or consolidation in the lower lobes of the lungs bilaterally. 3. Colonic diverticulosis without evidence of acute diverticulitis at this time. 4. Small umbilical hernia containing only omental fat. No associated bowel incarceration or obstruction at this time. Electronically Signed   By: Vinnie Langton M.D.   On: 01/24/2019 18:09   Dg Chest 1 View  Result Date: 01/30/2019 CLINICAL DATA:  Status post left thoracentesis. EXAM: CHEST  1 VIEW COMPARISON:  January 29, 2019. FINDINGS: Stable cardiomediastinal silhouette. Increased left upper lobe opacity is noted concerning for pneumonia. Stable left basilar atelectasis and/or pleural effusion is noted. Right lung is clear. No pneumothorax is noted. Bony thorax is unremarkable. IMPRESSION: No pneumothorax is noted. Increased left upper lobe opacity is noted concerning for pneumonia. Stable left basilar atelectasis and pleural effusion is noted. Electronically Signed   By: Marijo Conception M.D.   On: 01/30/2019 12:32   Ct Head Wo Contrast  Result Date: 01/28/2019 CLINICAL DATA:  63 year old female with altered mental status. EXAM: CT HEAD WITHOUT CONTRAST TECHNIQUE: Contiguous axial images were obtained from the base of the skull through the vertex without intravenous contrast. COMPARISON:  Head CT dated 04/25/2016 FINDINGS: Brain: The ventricles and sulci appropriate size for patient's age. Mild periventricular and deep white matter  chronic microvascular ischemic changes noted. There is no acute intracranial hemorrhage. No mass effect or midline shift. No extra-axial fluid  collection. Vascular: No hyperdense vessel or unexpected calcification. Skull: Normal. Negative for fracture or focal lesion. Sinuses/Orbits: The visualized paranasal sinuses and the left mastoid air cells are clear. Right mastoid effusions noted. Other: None IMPRESSION: 1. No acute intracranial hemorrhage. 2. Mild chronic microvascular ischemic changes. 3. Right mastoid effusions. Electronically Signed   By: Anner Crete M.D.   On: 01/28/2019 02:37   US Renal  Result Date: 01/28/2019 CLINICAL DATA:  63 year old female with acute renal insufficiency. History of diabetes and hypertension. Bladder surgery. EXAM: RENAL / URINARY TRACT ULTRASOUND COMPLETE COMPARISON:  Renal ultrasound dated 11/29/2017 FINDINGS: Right Kidney: Renal measurements: 11.3 x 5.6 x 5.3 cm = volume: 175 mL. Normal echogenicity. No hydronephrosis or shadowing stone. Left Kidney: Renal measurements: 10.3 x 4.7 x 6.0 cm = volume: 152 mL. Normal echogenicity. No hydronephrosis or shadowing stone. Bladder: The urinary bladder is decompressed around a Foley catheter. Other: None IMPRESSION: Unremarkable renal ultrasound. Electronically Signed   By: Anner Crete M.D.   On: 01/28/2019 19:50   Dg Chest Port 1 View  Result Date: 01/29/2019 CLINICAL DATA:  Dyspnea, shortness of breath, CHF, diabetic EXAM: PORTABLE CHEST 1 VIEW COMPARISON:  Radiograph 01/23/2019 FINDINGS: Veil like density in the left lung base suggesting a layering pleural effusion on a background of hazy interstitial opacities and cephalized, indistinct pulmonary vascularity with central vascular cuffing and septal lines. Prominence the cardiac silhouette is similar to comparison exams. No visible pneumothorax. Degenerative changes are present in the imaged spine and shoulders. No acute osseous or soft tissue abnormality.  IMPRESSION: 1. Findings consistent with mild to moderate CHF with interstitial edema. Suspect a moderate layering pleural effusion on the left. 2. Unchanged prominent cardiac silhouette, which could reflect cardiomegaly or pericardial effusion. Electronically Signed   By: Lovena Le M.D.   On: 01/29/2019 17:35   Dg Chest Port 1 View  Result Date: 01/23/2019 CLINICAL DATA:  63 year old female with shortness of breath. On home oxygen. EXAM: PORTABLE CHEST 1 VIEW COMPARISON:  10/31/2018 and earlier. FINDINGS: Portable AP semi upright view at 1509 hours. Stable lung volumes and mediastinal contours. Visualized tracheal air column is within normal limits. Further increased bilateral interstitial and lung base opacity, with obscuration of the diaphragm now. No superimposed pneumothorax. No definite air bronchograms. Paucity bowel gas in the upper abdomen. No acute osseous abnormality identified. IMPRESSION: Increasing bilateral lung opacity favored due to acute pulmonary edema with lung base atelectasis and possibly small pleural effusions. Bilateral lower lobe pneumonia felt less likely. Electronically Signed   By: Genevie Ann M.D.   On: 01/23/2019 15:18   US Thoracentesis Asp Pleural Space W/img Guide  Result Date: 01/30/2019 INDICATION: Patient with history of CHF, chronic kidney disease, bilateral pleural effusions. Request received for diagnostic and therapeutic left thoracentesis. EXAM: ULTRASOUND GUIDED DIAGNOSTIC AND THERAPEUTIC LEFT THORACENTESIS MEDICATIONS: None COMPLICATIONS: None immediate. PROCEDURE: An ultrasound guided thoracentesis was thoroughly discussed with the patient and questions answered. The benefits, risks, alternatives and complications were also discussed. The patient understands and wishes to proceed with the procedure. Written consent was obtained. Ultrasound was performed to localize and mark an adequate pocket of fluid in the left chest. The area was then prepped and draped in the  normal sterile fashion. 1% Lidocaine was used for local anesthesia. Under ultrasound guidance a 6 Fr Safe-T-Centesis catheter was introduced. Thoracentesis was performed. The catheter was removed and a dressing applied. FINDINGS: A total of approximately 270 cc of light yellow fluid was removed. Samples were sent  to the laboratory as requested by the clinical team. IMPRESSION: Successful ultrasound guided diagnostic and therapeutic left thoracentesis yielding 270 cc of pleural fluid. Read by: Rowe Robert, PA-C Electronically Signed   By: Jacqulynn Cadet M.D.   On: 01/30/2019 12:19     Antimicrobials:   None   Subjective: Patient still reports feeling short of breath Questionable confusion  Objective: Vitals:   01/29/19 1108 01/29/19 2106 01/30/19 0429 01/30/19 1215  BP: (!) 119/50 (!) 148/68 136/65 (!) 138/59  Pulse: 78 76 74   Resp: 20 20 20    Temp: 98.8 F (37.1 C)  97.8 F (36.6 C)   TempSrc: Oral  Oral   SpO2: 92% 90% 90%   Weight:   74.8 kg   Height:        Intake/Output Summary (Last 24 hours) at 01/30/2019 1405 Last data filed at 01/29/2019 2100 Gross per 24 hour  Intake 200 ml  Output 300 ml  Net -100 ml   Filed Weights   01/28/19 0500 01/29/19 0607 01/30/19 0429  Weight: 75 kg 74.8 kg 74.8 kg    Examination:  General exam: Appears calm and comfortable questionable confusion versus baseline, ?  Cognitive deficit Respiratory system: Clear to auscultation. Respiratory effort normal. Cardiovascular system: S1 & S2 heard, RRR. No JVD, murmurs, rubs, gallops or clicks. No pedal edema. Gastrointestinal system: Abdomen is nondistended, soft and nontender. No organomegaly or masses felt. Normal bowel sounds heard. Central nervous system: Alert and oriented. No focal neurological deficits. Extremities: Warm well perfused, neurovascularly intact Skin: No rashes, lesions or ulcers Psychiatry: Judgement and insight appear normal. Mood & affect flat.     Data  Reviewed: I have personally reviewed following labs and imaging studies  CBC: Recent Labs  Lab 01/24/19 0428 01/27/19 0440 01/30/19 0420  WBC 4.1 5.0 5.6  NEUTROABS  --  4.0  --   HGB 9.4* 8.7* 8.9*  HCT 31.8* 29.1* 30.1*  MCV 91.6 91.2 91.8  PLT 215 199 325   Basic Metabolic Panel: Recent Labs  Lab 01/26/19 0507 01/27/19 0440 01/28/19 1012 01/29/19 0445 01/30/19 0420  NA 137 135 135 136 137  K 4.8 4.6 4.5 4.0 3.7  CL 97* 97* 96* 99 101  CO2 30 26 27 27 27   GLUCOSE 105* 92 113* 92 85  BUN 44* 46* 52* 50* 47*  CREATININE 2.21* 2.40* 3.02* 2.70* 2.20*  CALCIUM 9.2 9.2 9.0 8.9 8.8*   GFR: Estimated Creatinine Clearance: 23.1 mL/min (A) (by C-G formula based on SCr of 2.2 mg/dL (H)). Liver Function Tests: Recent Labs  Lab 01/24/19 0428  AST 11*  ALT 10  ALKPHOS 58  BILITOT 0.4  PROT 5.4*  ALBUMIN 2.6*   Recent Labs  Lab 01/26/19 1209  LIPASE 20   No results for input(s): AMMONIA in the last 168 hours. Coagulation Profile: No results for input(s): INR, PROTIME in the last 168 hours. Cardiac Enzymes: No results for input(s): CKTOTAL, CKMB, CKMBINDEX, TROPONINI in the last 168 hours. BNP (last 3 results) No results for input(s): PROBNP in the last 8760 hours. HbA1C: No results for input(s): HGBA1C in the last 72 hours. CBG: Recent Labs  Lab 01/29/19 1106 01/29/19 1633 01/29/19 2111 01/30/19 0730 01/30/19 1340  GLUCAP 81 121* 104* 73 73   Lipid Profile: No results for input(s): CHOL, HDL, LDLCALC, TRIG, CHOLHDL, LDLDIRECT in the last 72 hours. Thyroid Function Tests: No results for input(s): TSH, T4TOTAL, FREET4, T3FREE, THYROIDAB in the last 72 hours. Anemia Panel: No  results for input(s): VITAMINB12, FOLATE, FERRITIN, TIBC, IRON, RETICCTPCT in the last 72 hours. Sepsis Labs: No results for input(s): PROCALCITON, LATICACIDVEN in the last 168 hours.  Recent Results (from the past 240 hour(s))  SARS CORONAVIRUS 2 (TAT 6-24 HRS) Nasopharyngeal  Nasopharyngeal Swab     Status: None   Collection Time: 01/23/19  3:55 PM   Specimen: Nasopharyngeal Swab  Result Value Ref Range Status   SARS Coronavirus 2 NEGATIVE NEGATIVE Final    Comment: (NOTE) SARS-CoV-2 target nucleic acids are NOT DETECTED. The SARS-CoV-2 RNA is generally detectable in upper and lower respiratory specimens during the acute phase of infection. Negative results do not preclude SARS-CoV-2 infection, do not rule out co-infections with other pathogens, and should not be used as the sole basis for treatment or other patient management decisions. Negative results must be combined with clinical observations, patient history, and epidemiological information. The expected result is Negative. Fact Sheet for Patients: SugarRoll.be Fact Sheet for Healthcare Providers: https://www.woods-mathews.com/ This test is not yet approved or cleared by the [place] FDA and  has been authorized for detection and/or diagnosis of SARS-CoV-2 by FDA under an Emergency Use Authorization (EUA). This EUA will remain  in effect (meaning this test can be used) for the duration of the COVID-19 declaration under Section 56 4(b)(1) of the Act, 21 U.S.C. section 360bbb-3(b)(1), unless the authorization is terminated or revoked sooner. Performed at [facility], [place] [address], [place], [place] [address]   Culture, Urine     Status: Abnormal   Collection Time: 01/28/19  8:20 AM   Specimen: Urine, Random  Result Value Ref Range Status   Specimen Description   Final    URINE, RANDOM Performed at [place] [address], [place], [place] [address]    Special Requests   Final    NONE Performed at [facility], [place] [address], [place],  [address]    Culture MULTIPLE SPECIES PRESENT, SUGGEST RECOLLECTION (A)  Final   Report Status 01/29/2019 FINAL  Final         Radiology Studies: Dg  Chest 1 View  Result Date: 01/30/2019 CLINICAL DATA:  Status post left thoracentesis. EXAM: CHEST  1 VIEW COMPARISON:  January 29, 2019. FINDINGS: Stable cardiomediastinal silhouette. Increased left upper lobe opacity is noted concerning for pneumonia. Stable left basilar atelectasis and/or pleural effusion is noted. Right lung is clear. No pneumothorax is noted. Bony thorax is unremarkable. IMPRESSION: No pneumothorax is noted. Increased left upper lobe opacity is noted concerning for pneumonia. Stable left basilar atelectasis and pleural effusion is noted. Electronically Signed   By: Marijo Conception M.D.   On: 01/30/2019 12:32   US Renal  Result Date: 01/28/2019 CLINICAL DATA:  63 year old female with acute renal insufficiency. History of diabetes and hypertension. Bladder surgery. EXAM: RENAL / URINARY TRACT ULTRASOUND COMPLETE COMPARISON:  Renal ultrasound dated 11/29/2017 FINDINGS: Right Kidney: Renal measurements: 11.3 x 5.6 x 5.3 cm = volume: 175 mL. Normal echogenicity. No hydronephrosis or shadowing stone. Left Kidney: Renal measurements: 10.3 x 4.7 x 6.0 cm = volume: 152 mL. Normal echogenicity. No hydronephrosis or shadowing stone. Bladder: The urinary bladder is decompressed around a Foley catheter. Other: None IMPRESSION: Unremarkable renal ultrasound. Electronically Signed   By: Anner Crete M.D.   On: 01/28/2019 19:50   Dg Chest Port 1 View  Result Date: 01/29/2019 CLINICAL DATA:  Dyspnea, shortness of breath, CHF, diabetic EXAM: PORTABLE CHEST 1 VIEW COMPARISON:  Radiograph  01/23/2019 FINDINGS: Veil like density in the left lung base suggesting a layering pleural effusion on a background of hazy interstitial opacities and cephalized, indistinct pulmonary vascularity with central vascular cuffing and septal lines. Prominence the cardiac silhouette is similar to comparison exams. No visible pneumothorax. Degenerative changes are present in the imaged spine and shoulders. No acute  osseous or soft tissue abnormality. IMPRESSION: 1. Findings consistent with mild to moderate CHF with interstitial edema. Suspect a moderate layering pleural effusion on the left. 2. Unchanged prominent cardiac silhouette, which could reflect cardiomegaly or pericardial effusion. Electronically Signed   By: Lovena Le M.D.   On: 01/29/2019 17:35   US Thoracentesis Asp Pleural Space W/img Guide  Result Date: 01/30/2019 INDICATION: Patient with history of CHF, chronic kidney disease, bilateral pleural effusions. Request received for diagnostic and therapeutic left thoracentesis. EXAM: ULTRASOUND GUIDED DIAGNOSTIC AND THERAPEUTIC LEFT THORACENTESIS MEDICATIONS: None COMPLICATIONS: None immediate. PROCEDURE: An ultrasound guided thoracentesis was thoroughly discussed with the patient and questions answered. The benefits, risks, alternatives and complications were also discussed. The patient understands and wishes to proceed with the procedure. Written consent was obtained. Ultrasound was performed to localize and mark an adequate pocket of fluid in the left chest. The area was then prepped and draped in the normal sterile fashion. 1% Lidocaine was used for local anesthesia. Under ultrasound guidance a 6 Fr Safe-T-Centesis catheter was introduced. Thoracentesis was performed. The catheter was removed and a dressing applied. FINDINGS: A total of approximately 270 cc of light yellow fluid was removed. Samples were sent to the laboratory as requested by the clinical team. IMPRESSION: Successful ultrasound guided diagnostic and therapeutic left thoracentesis yielding 270 cc of pleural fluid. Read by: Rowe Robert, PA-C Electronically Signed   By: Jacqulynn Cadet M.D.   On: 01/30/2019 12:19        Scheduled Meds:  allopurinol  150 mg Oral Daily   aspirin EC  81 mg Oral Daily   atorvastatin  80 mg Oral q1800   carvedilol  12.5 mg Oral BID WC   Chlorhexidine Gluconate Cloth  6 each Topical Daily    DULoxetine  60 mg Oral Daily   enoxaparin (LOVENOX) injection  30 mg Subcutaneous Q24H   gabapentin  300 mg Oral BID   insulin aspart  0-15 Units Subcutaneous TID WC   insulin aspart  0-5 Units Subcutaneous QHS   insulin glargine  10 Units Subcutaneous QHS   isosorbide-hydrALAZINE  1 tablet Oral TID   lidocaine       pantoprazole (PROTONIX) IV  40 mg Intravenous Q12H   Continuous Infusions:  cefTRIAXone (ROCEPHIN)  IV 1 g (01/30/19 1109)     LOS: 7 days    Time spent: 35 min    Nicolette Bang, MD [facility]  If 7PM-7AM, please contact night-coverage  01/30/2019, 2:05 PM

## 2019-01-30 NOTE — Progress Notes (Signed)
PT Cancellation Note  Patient Details Name: Breeann Reposa MRN: [id number] DOB: [birth year]   Cancelled Treatment:    Reason Eval/Treat Not Completed: Patient at procedure or test/unavailable   Tashay Bozich,KATHrine E 01/30/2019, 11:13 AM Carmelia Bake, PT, DPT Acute Rehabilitation Services Office: [phone] Pager: [phone]

## 2019-01-30 NOTE — TOC Progression Note (Signed)
Transition of Care [facility]) - Progression Note    Patient Details  Name: Kara Hanson MRN: [id number] Date of Birth: 06/23/1955  [facility]) CM/SW Contact  Purcell Mouton, RN Phone Number: 01/30/2019, 10:59 AM  Clinical Narrative:    Checked with pt for [facility] needs. Pt continued to decline HH.    Expected Discharge Plan: Home/Self Care Barriers to Discharge: Continued Medical Work up  Expected Discharge Plan and Services Expected Discharge Plan: Home/Self Care   Discharge Planning Services: CM Consult   Living arrangements for the past 2 months: Apartment                 DME Arranged: N/A DME Agency: NA       HH Arranged: Patient Refused HH           Social Determinants of Health (SDOH) Interventions    Readmission Risk Interventions Readmission Risk Prevention Plan 01/27/2019 10/02/2018 09/28/2018  Transportation Screening Complete Complete Complete  PCP or Specialist Appt within 3-5 Days Not Complete Complete Not Complete  Not Complete comments not ready to dc - Continued medical workup; will assess prior to discharge  [place] or [place] Complete Complete Complete  Social Work Consult for [place] Planning/Counseling Complete Complete Complete  Palliative Care Screening Not Applicable Not Applicable Not Applicable  Medication Review [employer] photographer) Complete Complete Complete  Some recent data might be hidden

## 2019-01-31 ENCOUNTER — Inpatient Hospital Stay (HOSPITAL_COMMUNITY): Payer: Medicare Other

## 2019-01-31 DIAGNOSIS — J9811 Atelectasis: Secondary | ICD-10-CM | POA: Diagnosis not present

## 2019-01-31 DIAGNOSIS — I5043 Acute on chronic combined systolic (congestive) and diastolic (congestive) heart failure: Secondary | ICD-10-CM | POA: Diagnosis not present

## 2019-01-31 LAB — BASIC METABOLIC PANEL
Anion gap: 9 (ref 5–15)
BUN: 40 mg/dL — ABNORMAL HIGH (ref 8–23)
CO2: 29 mmol/L (ref 22–32)
Calcium: 8.7 mg/dL — ABNORMAL LOW (ref 8.9–10.3)
Chloride: 100 mmol/L (ref 98–111)
Creatinine, Ser: 1.92 mg/dL — ABNORMAL HIGH (ref 0.44–1.00)
GFR calc Af Amer: 31 mL/min — ABNORMAL LOW (ref >60)
GFR calc non Af Amer: 27 mL/min — ABNORMAL LOW (ref >60)
Glucose, Bld: 100 mg/dL — ABNORMAL HIGH (ref 70–99)
Potassium: 3.6 mmol/L (ref 3.5–5.1)
Sodium: 138 mmol/L (ref 135–145)

## 2019-01-31 LAB — GLUCOSE, CAPILLARY
Glucose-Capillary: 89 mg/dL (ref 70–99)
Glucose-Capillary: 103 mg/dL — ABNORMAL HIGH (ref 70–99)
Glucose-Capillary: 113 mg/dL — ABNORMAL HIGH (ref 70–99)
Glucose-Capillary: 110 mg/dL — ABNORMAL HIGH (ref 70–99)

## 2019-01-31 LAB — PROCALCITONIN: Procalcitonin: <0.1 ng/mL

## 2019-01-31 LAB — CYTOLOGY - NON PAP

## 2019-01-31 MED ORDER — SODIUM CHLORIDE 3 % IN NEBU
4.0000 mL | INHALATION_SOLUTION | Freq: Two times a day (BID) | RESPIRATORY_TRACT | Status: AC
  Administered 2019-01-31 – 2019-02-03 (×6): 4 mL via RESPIRATORY_TRACT
  Filled 2019-01-31 (×8): qty 4

## 2019-01-31 MED ORDER — FUROSEMIDE 10 MG/ML IJ SOLN
40.0000 mg | Freq: Three times a day (TID) | INTRAMUSCULAR | Status: DC
  Administered 2019-01-31 – 2019-02-01 (×4): 40 mg via INTRAVENOUS
  Filled 2019-01-31 (×4): qty 4

## 2019-01-31 MED ORDER — PROMETHAZINE HCL 25 MG/ML IJ SOLN
12.5000 mg | Freq: Four times a day (QID) | INTRAMUSCULAR | Status: DC | PRN
  Administered 2019-01-31: 12.5 mg via INTRAVENOUS
  Filled 2019-01-31: qty 1

## 2019-01-31 MED ORDER — SODIUM CHLORIDE 0.9 % IV SOLN
2.0000 g | INTRAVENOUS | Status: DC
  Filled 2019-01-31: qty 20

## 2019-01-31 MED ORDER — DM-GUAIFENESIN ER 30-600 MG PO TB12
1.0000 | ORAL_TABLET | Freq: Two times a day (BID) | ORAL | Status: DC
  Administered 2019-01-31: 1 via ORAL
  Filled 2019-01-31: qty 1

## 2019-01-31 MED ORDER — ALBUTEROL SULFATE (2.5 MG/3ML) 0.083% IN NEBU
2.5000 mg | INHALATION_SOLUTION | Freq: Three times a day (TID) | RESPIRATORY_TRACT | Status: DC
  Administered 2019-02-01 – 2019-02-04 (×9): 2.5 mg via RESPIRATORY_TRACT
  Filled 2019-01-31 (×10): qty 3

## 2019-01-31 MED ORDER — ALBUTEROL SULFATE (2.5 MG/3ML) 0.083% IN NEBU
2.5000 mg | INHALATION_SOLUTION | RESPIRATORY_TRACT | Status: DC | PRN
  Administered 2019-02-01: 21:00:00 2.5 mg via RESPIRATORY_TRACT
  Filled 2019-01-31: qty 3

## 2019-01-31 MED ORDER — SODIUM CHLORIDE 0.9 % IV SOLN
500.0000 mg | INTRAVENOUS | Status: DC
  Administered 2019-01-31 – 2019-02-01 (×2): 500 mg via INTRAVENOUS
  Filled 2019-01-31 (×2): qty 500

## 2019-01-31 NOTE — Plan of Care (Signed)
Patient more interactive this shift.  Continues to eat minimal amounts of food (sips of broth, half piece of toast) and continues to complain of nausea.  Medicated for nausea x 2 this shift with improvement.  This RN spoke to daughter [name] over the phone.

## 2019-01-31 NOTE — Progress Notes (Signed)
PROGRESS NOTE    Kara Hanson  ASN:[id number] DOB: 1956/02/27 DOA: 01/23/2019 PCP: [name], MD   Brief Narrative:  Patient is a 63 female with history of congestive heart failure with action fraction 50% as per echo on 11/01/2018 who presented with shortness of breath, dyspnea on exertion, orthopnea. She is on 4 L of oxygen at home 24/7 for her chronic hypoxia. On presentation she was found to be in acute on chronic CHF. She had bilateral basal crackles, bilateral lower extremity edema and elevated JVD. Echocardiogram done here today showed ejection fraction of 40 to 45%. She was started onIV Lasix but treatment is been complicated by rising creatinine. Cardiology and nephrology following.Patient also complained of persistent nausea, vomiting, abdominal pain, not tolerating diet well this is been improving somewhat Underwent thoracentesis with questionable parapneumonic effusion, pulmonary see in consult  Assessment & Plan:   Active Problems:   Hyperlipidemia LDL goal <70   Acute on chronic combined systolic and diastolic CHF (congestive heart failure) (HCC)   Controlled type 2 diabetes with neuropathy (HCC)   CHF (congestive heart failure) (HCC)   Epigastric pain   Acute renal failure superimposed on stage 3 chronic kidney disease ([place])   Acute on chronic systolic CHF:As previously noted, presented with dyspnea on minimal exertion, orthopnea. Had bilateral lower extremity edema, basal crackles and elevated JVD on presentation. Elevated BNP. echocardiogram showed decrease ejection fraction to 40 to 45%.  -Started on Lasix which was held secondary rising creatinine, restarted 10/28 with good output creatinine still declining -Continue with strict I's and O's Daily weights -Repeat chest x-ray showed effusion and edema -Underwent thoracentesis October 28 ? parapneumonic effusion -Pulmonology to see in consultation  AKI: Baseline creatinine ranges from  1.3-1.6.3.02>2.7>2.2>1.9 -not consistent with a pure overdiuresis, -As noted restarted Lasix 2 doses, -appreciateneph consult, follow their recs  Abdominal discomfort/painwith erosive gastritis:Patient complained of persistent nausea, vomiting, abdominal pain, not tolerating diet. GI consulted. -EGD showed erosive gastritis without acute bleeding ulcer -CT abdomen/pelvis did not show any acute intra-abdominal abnormalities. -Continue Protonixalthough currently 40 IV twice daily, patient's abdominal pain improving,  Diabetes type 2: Recent hemoglobin A1c of 7.8. On insulin at home.  -Continue current insulin regimen here.  -On Neurontin and Cymbalta for diabetic neuropathy. -Patient reported Cymbalta makes her feel lethargic, will just continue Cymbalta although Neurontin likely contributing  Hypertension:Currently blood pressure stable.  -Continue current medicines  Hyperlipidemia:  Continue atorvastatin  Gout: -On allopurinol -No evidence of flareup will monitor closely in setting of diuresis  Deconditioning/debility:  -Patient continues to decline home health  DVT prophylaxis: Lovenox SQ  Code Status: Full    Code Status Orders  (From admission, onward)         Start     Ordered   01/23/19 1757  Full code  Continuous     01/23/19 1757        Code Status History    Date Active Date Inactive Code Status Order ID Comments User Context   11/01/2018 0029 11/05/2018 2014 Full Code [id number]  Sid Falcon, MD ED   09/24/2018 2303 10/02/2018 1600 Full Code [id number]  Elwyn Reach, MD Inpatient   07/20/2018 1655 07/26/2018 1800 Full Code [id number]  Debbe Odea, MD ED   03/20/2018 0402 03/24/2018 1949 Full Code [id number]  Norval Morton, MD ED   11/27/2017 0705 11/30/2017 1625 Full Code [id number]  Radene Gunning, NP ED   10/01/2016 0033 10/01/2016 1946 Full Code [id number]  Maryellen Pile, MD ED  06/17/2016 1720 06/18/2016 2022 Full Code [id number]   Reyne Dumas, MD ED   04/23/2016 0418 04/26/2016 1716 Full Code [id number]  Rise Patience, MD ED   Advance Care Planning Activity     Family Communication: called daughter, NA LM Disposition Plan:   Patient remained inpatient for continued IV antibiotics, expert subspecialty consultation with pulmonology for possible parapneumonic effusion.  Patient remains short of breath and weak and is not stable for discharge Consults called: PCCM Admission status: Inpatient   Consultants:   GI and renal, cardiology  Procedures:  Ct Abdomen Pelvis Wo Contrast  Result Date: 01/24/2019 CLINICAL DATA:  63 year old female with history of abdominal distension. Epigastric abdominal pain. EXAM: CT ABDOMEN AND PELVIS WITHOUT CONTRAST TECHNIQUE: Multidetector CT imaging of the abdomen and pelvis was performed following the standard protocol without IV contrast. COMPARISON:  CT the abdomen and pelvis 01/02/2018. FINDINGS: Lower chest: Moderate bilateral pleural effusions with areas of atelectasis and/or consolidation in the lower lobes of the lungs bilaterally. Atherosclerotic calcifications in the thoracic aorta as well as the left main, left anterior descending, left circumflex and right coronary arteries. Calcifications of the aortic valve. Cardiomegaly. Hepatobiliary: No suspicious cystic or solid hepatic lesions are confidently identified on today's noncontrast CT examination. Status post cholecystectomy. Pancreas: Fatty atrophy in the pancreas. No discrete pancreatic mass, no definite pancreatic mass or peripancreatic fluid collections or inflammatory changes are noted on today's noncontrast CT examination. Spleen: Unremarkable. Adrenals/Urinary Tract: Unenhanced appearance of the kidneys and bilateral adrenal glands is unremarkable. No hydroureteronephrosis. Urinary bladder is unremarkable in appearance. Stomach/Bowel: Unenhanced appearance of the stomach is normal. There is no pathologic dilatation of small  bowel or colon. Multiple scattered colonic diverticulae are noted, without surrounding inflammatory changes to suggest an acute diverticulitis at this time. Normal appendix. Vascular/Lymphatic: Aortic atherosclerosis. Retroaortic left renal vein (normal anatomical variant) incidentally noted. No lymphadenopathy identified in the abdomen or pelvis. Reproductive: Unenhanced appearance of the uterus and ovaries is unremarkable. Other: Small umbilical hernia containing only omental fat. No significant volume of ascites. No pneumoperitoneum. Musculoskeletal: There are no aggressive appearing lytic or blastic lesions noted in the visualized portions of the skeleton. IMPRESSION: 1. No acute findings are noted in the abdomen or pelvis to account for the patient's symptoms. 2. Moderate bilateral pleural effusions with dependent areas of atelectasis and/or consolidation in the lower lobes of the lungs bilaterally. 3. Colonic diverticulosis without evidence of acute diverticulitis at this time. 4. Small umbilical hernia containing only omental fat. No associated bowel incarceration or obstruction at this time. Electronically Signed   By: Vinnie Langton M.D.   On: 01/24/2019 18:09   Dg Chest 1 View  Result Date: 01/30/2019 CLINICAL DATA:  Status post left thoracentesis. EXAM: CHEST  1 VIEW COMPARISON:  January 29, 2019. FINDINGS: Stable cardiomediastinal silhouette. Increased left upper lobe opacity is noted concerning for pneumonia. Stable left basilar atelectasis and/or pleural effusion is noted. Right lung is clear. No pneumothorax is noted. Bony thorax is unremarkable. IMPRESSION: No pneumothorax is noted. Increased left upper lobe opacity is noted concerning for pneumonia. Stable left basilar atelectasis and pleural effusion is noted. Electronically Signed   By: Marijo Conception M.D.   On: 01/30/2019 12:32   Ct Head Wo Contrast  Result Date: 01/28/2019 CLINICAL DATA:  63 year old female with altered mental  status. EXAM: CT HEAD WITHOUT CONTRAST TECHNIQUE: Contiguous axial images were obtained from the base of the skull through the vertex without intravenous contrast. COMPARISON:  Head CT dated  04/25/2016 FINDINGS: Brain: The ventricles and sulci appropriate size for patient's age. Mild periventricular and deep white matter chronic microvascular ischemic changes noted. There is no acute intracranial hemorrhage. No mass effect or midline shift. No extra-axial fluid collection. Vascular: No hyperdense vessel or unexpected calcification. Skull: Normal. Negative for fracture or focal lesion. Sinuses/Orbits: The visualized paranasal sinuses and the left mastoid air cells are clear. Right mastoid effusions noted. Other: None IMPRESSION: 1. No acute intracranial hemorrhage. 2. Mild chronic microvascular ischemic changes. 3. Right mastoid effusions. Electronically Signed   By: Anner Crete M.D.   On: 01/28/2019 02:37   US Renal  Result Date: 01/28/2019 CLINICAL DATA:  63 year old female with acute renal insufficiency. History of diabetes and hypertension. Bladder surgery. EXAM: RENAL / URINARY TRACT ULTRASOUND COMPLETE COMPARISON:  Renal ultrasound dated 11/29/2017 FINDINGS: Right Kidney: Renal measurements: 11.3 x 5.6 x 5.3 cm = volume: 175 mL. Normal echogenicity. No hydronephrosis or shadowing stone. Left Kidney: Renal measurements: 10.3 x 4.7 x 6.0 cm = volume: 152 mL. Normal echogenicity. No hydronephrosis or shadowing stone. Bladder: The urinary bladder is decompressed around a Foley catheter. Other: None IMPRESSION: Unremarkable renal ultrasound. Electronically Signed   By: Anner Crete M.D.   On: 01/28/2019 19:50   Dg Chest Port 1 View  Result Date: 01/29/2019 CLINICAL DATA:  Dyspnea, shortness of breath, CHF, diabetic EXAM: PORTABLE CHEST 1 VIEW COMPARISON:  Radiograph 01/23/2019 FINDINGS: Veil like density in the left lung base suggesting a layering pleural effusion on a background of hazy  interstitial opacities and cephalized, indistinct pulmonary vascularity with central vascular cuffing and septal lines. Prominence the cardiac silhouette is similar to comparison exams. No visible pneumothorax. Degenerative changes are present in the imaged spine and shoulders. No acute osseous or soft tissue abnormality. IMPRESSION: 1. Findings consistent with mild to moderate CHF with interstitial edema. Suspect a moderate layering pleural effusion on the left. 2. Unchanged prominent cardiac silhouette, which could reflect cardiomegaly or pericardial effusion. Electronically Signed   By: Lovena Le M.D.   On: 01/29/2019 17:35   Dg Chest Port 1 View  Result Date: 01/23/2019 CLINICAL DATA:  63 year old female with shortness of breath. On home oxygen. EXAM: PORTABLE CHEST 1 VIEW COMPARISON:  10/31/2018 and earlier. FINDINGS: Portable AP semi upright view at 1509 hours. Stable lung volumes and mediastinal contours. Visualized tracheal air column is within normal limits. Further increased bilateral interstitial and lung base opacity, with obscuration of the diaphragm now. No superimposed pneumothorax. No definite air bronchograms. Paucity bowel gas in the upper abdomen. No acute osseous abnormality identified. IMPRESSION: Increasing bilateral lung opacity favored due to acute pulmonary edema with lung base atelectasis and possibly small pleural effusions. Bilateral lower lobe pneumonia felt less likely. Electronically Signed   By: Genevie Ann M.D.   On: 01/23/2019 15:18   US Thoracentesis Asp Pleural Space W/img Guide  Result Date: 01/30/2019 INDICATION: Patient with history of CHF, chronic kidney disease, bilateral pleural effusions. Request received for diagnostic and therapeutic left thoracentesis. EXAM: ULTRASOUND GUIDED DIAGNOSTIC AND THERAPEUTIC LEFT THORACENTESIS MEDICATIONS: None COMPLICATIONS: None immediate. PROCEDURE: An ultrasound guided thoracentesis was thoroughly discussed with the patient and  questions answered. The benefits, risks, alternatives and complications were also discussed. The patient understands and wishes to proceed with the procedure. Written consent was obtained. Ultrasound was performed to localize and mark an adequate pocket of fluid in the left chest. The area was then prepped and draped in the normal sterile fashion. 1% Lidocaine was used for local  anesthesia. Under ultrasound guidance a 6 Fr Safe-T-Centesis catheter was introduced. Thoracentesis was performed. The catheter was removed and a dressing applied. FINDINGS: A total of approximately 270 cc of light yellow fluid was removed. Samples were sent to the laboratory as requested by the clinical team. IMPRESSION: Successful ultrasound guided diagnostic and therapeutic left thoracentesis yielding 270 cc of pleural fluid. Read by: Rowe Robert, PA-C Electronically Signed   By: Jacqulynn Cadet M.D.   On: 01/30/2019 12:19     Antimicrobials:   CEFTRIAXONE AND AZITHROMYCIN    Subjective: Patient still reports short of breath with cough Clinically looks improved this morning  Objective: Vitals:   01/30/19 1537 01/30/19 2232 01/31/19 0500 01/31/19 0615  BP:  (!) 143/64  138/63  Pulse:  82  76  Resp:  18  16  Temp:  97.7 F (36.5 C)  98.3 F (36.8 C)  TempSrc:  Oral  Oral  SpO2: (!) 89% 90%  (!) 89%  Weight:   74.9 kg   Height:        Intake/Output Summary (Last 24 hours) at 01/31/2019 1333 Last data filed at 01/31/2019 1000 Gross per 24 hour  Intake 260 ml  Output 1650 ml  Net -1390 ml   Filed Weights   01/29/19 0607 01/30/19 0429 01/31/19 0500  Weight: 74.8 kg 74.8 kg 74.9 kg    Examination:  General exam: Appears calm and comfortable  Respiratory system: Rhonchi bilaterally rales right greater than left Cardiovascular system: S1 & S2 heard, RRR. No JVD, murmurs, rubs, gallops or clicks. No pedal edema. Gastrointestinal system: Abdomen is nondistended, soft and nontender. No organomegaly or  masses felt. Normal bowel sounds heard. Central nervous system: Alert and oriented. No focal neurological deficits. Extremities: Warm well perfused, no edema Skin: No rashes, lesions or ulcers Psychiatry: Judgement and insight appear normal. Mood & affect appropriate.     Data Reviewed: I have personally reviewed following labs and imaging studies  CBC: Recent Labs  Lab 01/27/19 0440 01/30/19 0420  WBC 5.0 5.6  NEUTROABS 4.0  --   HGB 8.7* 8.9*  HCT 29.1* 30.1*  MCV 91.2 91.8  PLT 199 798   Basic Metabolic Panel: Recent Labs  Lab 01/27/19 0440 01/28/19 1012 01/29/19 0445 01/30/19 0420 01/31/19 0433  NA 135 135 136 137 138  K 4.6 4.5 4.0 3.7 3.6  CL 97* 96* 99 101 100  CO2 26 27 27 27 29   GLUCOSE 92 113* 92 85 100*  BUN 46* 52* 50* 47* 40*  CREATININE 2.40* 3.02* 2.70* 2.20* 1.92*  CALCIUM 9.2 9.0 8.9 8.8* 8.7*   GFR: Estimated Creatinine Clearance: 26.4 mL/min (A) (by C-G formula based on SCr of 1.92 mg/dL (H)). Liver Function Tests: No results for input(s): AST, ALT, ALKPHOS, BILITOT, PROT, ALBUMIN in the last 168 hours. Recent Labs  Lab 01/26/19 1209  LIPASE 20   No results for input(s): AMMONIA in the last 168 hours. Coagulation Profile: No results for input(s): INR, PROTIME in the last 168 hours. Cardiac Enzymes: No results for input(s): CKTOTAL, CKMB, CKMBINDEX, TROPONINI in the last 168 hours. BNP (last 3 results) No results for input(s): PROBNP in the last 8760 hours. HbA1C: No results for input(s): HGBA1C in the last 72 hours. CBG: Recent Labs  Lab 01/30/19 1340 01/30/19 1628 01/30/19 2203 01/31/19 0730 01/31/19 1157  GLUCAP 73 94 104* 89 103*   Lipid Profile: No results for input(s): CHOL, HDL, LDLCALC, TRIG, CHOLHDL, LDLDIRECT in the last 72 hours. Thyroid Function  Tests: No results for input(s): TSH, T4TOTAL, FREET4, T3FREE, THYROIDAB in the last 72 hours. Anemia Panel: No results for input(s): VITAMINB12, FOLATE, FERRITIN, TIBC,  IRON, RETICCTPCT in the last 72 hours. Sepsis Labs: No results for input(s): PROCALCITON, LATICACIDVEN in the last 168 hours.  Recent Results (from the past 240 hour(s))  SARS CORONAVIRUS 2 (TAT 6-24 HRS) Nasopharyngeal Nasopharyngeal Swab     Status: None   Collection Time: 01/23/19  3:55 PM   Specimen: Nasopharyngeal Swab  Result Value Ref Range Status   SARS Coronavirus 2 NEGATIVE NEGATIVE Final    Comment: (NOTE) SARS-CoV-2 target nucleic acids are NOT DETECTED. The SARS-CoV-2 RNA is generally detectable in upper and lower respiratory specimens during the acute phase of infection. Negative results do not preclude SARS-CoV-2 infection, do not rule out co-infections with other pathogens, and should not be used as the sole basis for treatment or other patient management decisions. Negative results must be combined with clinical observations, patient history, and epidemiological information. The expected result is Negative. Fact Sheet for Patients: SugarRoll.be Fact Sheet for Healthcare Providers: https://www.woods-mathews.com/ This test is not yet approved or cleared by the [place] FDA and  has been authorized for detection and/or diagnosis of SARS-CoV-2 by FDA under an Emergency Use Authorization (EUA). This EUA will remain  in effect (meaning this test can be used) for the duration of the COVID-19 declaration under Section 56 4(b)(1) of the Act, 21 U.S.C. section 360bbb-3(b)(1), unless the authorization is terminated or revoked sooner. Performed at [facility], [place] [address], [place], [place] [address]   Culture, Urine     Status: Abnormal   Collection Time: 01/28/19  8:20 AM   Specimen: Urine, Random  Result Value Ref Range Status   Specimen Description   Final    URINE, RANDOM Performed at [facility] [address], [place], [place] [address]    Special Requests   Final    NONE Performed at  [facility], [place] [address], [place], [place] [address]    Culture MULTIPLE SPECIES PRESENT, SUGGEST RECOLLECTION (A)  Final   Report Status 01/29/2019 FINAL  Final  Culture, body fluid-bottle     Status: None (Preliminary result)   Collection Time: 01/30/19 12:38 PM   Specimen: Pleura  Result Value Ref Range Status   Specimen Description PLEURAL LEFT  Final   Special Requests NONE  Final   Culture   Final    NO GROWTH < 24 HOURS Performed at [facility], [place] [address], [place], [place] [address]    Report Status PENDING  Incomplete  Gram stain     Status: None   Collection Time: 01/30/19 12:38 PM   Specimen: Pleura  Result Value Ref Range Status   Specimen Description PLEURAL LEFT  Final   Special Requests NONE  Final   Gram Stain   Final    MODERATE WBC PRESENT, PREDOMINANTLY MONONUCLEAR NO ORGANISMS SEEN Performed at [facility], [place] [address] [place], [place] [address]    Report Status 01/30/2019 FINAL  Final         Radiology Studies: Dg Chest 1 View  Result Date: 01/30/2019 CLINICAL DATA:  Status post left thoracentesis. EXAM: CHEST  1 VIEW COMPARISON:  January 29, 2019. FINDINGS: Stable cardiomediastinal silhouette. Increased left upper lobe opacity is noted concerning for pneumonia. Stable left basilar atelectasis and/or pleural effusion is noted. Right lung is clear. No pneumothorax is noted. Bony thorax is  unremarkable. IMPRESSION: No pneumothorax is noted. Increased left upper lobe opacity is noted concerning for pneumonia. Stable left basilar atelectasis and pleural effusion is noted. Electronically Signed   By: Marijo Conception M.D.   On: 01/30/2019 12:32   Dg Chest Port 1 View  Result Date: 01/29/2019 CLINICAL DATA:  Dyspnea, shortness of breath, CHF, diabetic EXAM: PORTABLE CHEST 1 VIEW COMPARISON:  Radiograph 01/23/2019 FINDINGS: Veil like density in the left lung base suggesting a layering pleural effusion on a background  of hazy interstitial opacities and cephalized, indistinct pulmonary vascularity with central vascular cuffing and septal lines. Prominence the cardiac silhouette is similar to comparison exams. No visible pneumothorax. Degenerative changes are present in the imaged spine and shoulders. No acute osseous or soft tissue abnormality. IMPRESSION: 1. Findings consistent with mild to moderate CHF with interstitial edema. Suspect a moderate layering pleural effusion on the left. 2. Unchanged prominent cardiac silhouette, which could reflect cardiomegaly or pericardial effusion. Electronically Signed   By: Lovena Le M.D.   On: 01/29/2019 17:35   US Thoracentesis Asp Pleural Space W/img Guide  Result Date: 01/30/2019 INDICATION: Patient with history of CHF, chronic kidney disease, bilateral pleural effusions. Request received for diagnostic and therapeutic left thoracentesis. EXAM: ULTRASOUND GUIDED DIAGNOSTIC AND THERAPEUTIC LEFT THORACENTESIS MEDICATIONS: None COMPLICATIONS: None immediate. PROCEDURE: An ultrasound guided thoracentesis was thoroughly discussed with the patient and questions answered. The benefits, risks, alternatives and complications were also discussed. The patient understands and wishes to proceed with the procedure. Written consent was obtained. Ultrasound was performed to localize and mark an adequate pocket of fluid in the left chest. The area was then prepped and draped in the normal sterile fashion. 1% Lidocaine was used for local anesthesia. Under ultrasound guidance a 6 Fr Safe-T-Centesis catheter was introduced. Thoracentesis was performed. The catheter was removed and a dressing applied. FINDINGS: A total of approximately 270 cc of light yellow fluid was removed. Samples were sent to the laboratory as requested by the clinical team. IMPRESSION: Successful ultrasound guided diagnostic and therapeutic left thoracentesis yielding 270 cc of pleural fluid. Read by: Rowe Robert, PA-C  Electronically Signed   By: Jacqulynn Cadet M.D.   On: 01/30/2019 12:19        Scheduled Meds:  allopurinol  150 mg Oral Daily   aspirin EC  81 mg Oral Daily   atorvastatin  80 mg Oral q1800   carvedilol  12.5 mg Oral BID WC   Chlorhexidine Gluconate Cloth  6 each Topical Daily   DULoxetine  60 mg Oral Daily   enoxaparin (LOVENOX) injection  30 mg Subcutaneous Q24H   furosemide  40 mg Intravenous Q8H   gabapentin  300 mg Oral BID   insulin aspart  0-15 Units Subcutaneous TID WC   insulin aspart  0-5 Units Subcutaneous QHS   insulin glargine  10 Units Subcutaneous QHS   isosorbide-hydrALAZINE  1 tablet Oral TID   pantoprazole (PROTONIX) IV  40 mg Intravenous Q12H   Continuous Infusions:  azithromycin 500 mg (01/31/19 1154)   cefTRIAXone (ROCEPHIN)  IV 1 g (01/31/19 1051)     LOS: 8 days    Time spent: 35 min    Nicolette Bang, MD [facility]  If 7PM-7AM, please contact night-coverage  01/31/2019, 1:33 PM

## 2019-01-31 NOTE — Progress Notes (Signed)
CPT done with Pt via flutter valve.  Pt had very good effort with the device but cough is weak despite coaching.

## 2019-01-31 NOTE — Progress Notes (Signed)
Physical Therapy Treatment Patient Details Name: Kara Hanson MRN: [id number] DOB: 02/12/1956 Today's Date: 01/31/2019    History of Present Illness Patient is a 37 female with history of congestive heart failure with action fraction 50% as per echo on 11/01/2018 who presented with shortness of breath, dyspnea on exertion, orthopnea.  Pt was COVID-19 4/9-4/22/20 and COVID-19 PNA 09/24/18. She is on 4 L of oxygen at home 24/7 for her chronic hypoxia.    PT Comments    Pt was limited today by nausea and decreasing O2 sats.  She ambulated on 4 LPM and sats dropped to 75% taking 2 minutes on 6 LPM to recover.  She was returned to 4 LPM at rest.  She did demonstrate improved transfers and less assist with gait, but tolerance not as well as last visit.       Follow Up Recommendations  Home health PT;Supervision/Assistance - 24 hour     Equipment Recommendations  None recommended by PT    Recommendations for Other Services       Precautions / Restrictions Precautions Precautions: Fall Precaution Comments: chronic 4L O2    Mobility  Bed Mobility                  Transfers Overall transfer level: Needs assistance Equipment used: Rolling walker (2 wheeled) Transfers: Sit to/from Stand Sit to Stand: Min assist         General transfer comment: cues for hand placement, required momentum and 2 tries;  performed from chair and toielt; toielting ADLs with CGA for balance  Ambulation/Gait Ambulation/Gait assistance: Min assist Gait Distance (Feet): 80 Feet Assistive device: Rolling walker (2 wheeled) Gait Pattern/deviations: Step-through pattern;Decreased stride length     General Gait Details: Required 3 standing rest breaks and assist with RW;  pt placed on 4L O2 [place] (on 3L O2 at rest in room sats 92%), SPO2 75% during ambulation and took 2 mins to recover on 6 LPM O2.  Later ambulated 10' from toielt and still dropped 77% in short distance 2 minutes to recover; left on 4  LPM O2 sats 92%; RN present and aware   Stairs             Wheelchair Mobility    Modified Rankin (Stroke Patients Only)       Balance Overall balance assessment: Needs assistance   Sitting balance-Leahy Scale: Fair     Standing balance support: Bilateral upper extremity supported Standing balance-Leahy Scale: Fair                              Cognition Arousal/Alertness: Awake/alert Behavior During Therapy: WFL for tasks assessed/performed Overall Cognitive Status: Within Functional Limits for tasks assessed                                        Exercises      General Comments        Pertinent Vitals/Pain Pain Assessment: Faces Faces Pain Scale: Hurts little more Pain Location: abdomen Pain Descriptors / Indicators: Discomfort Pain Intervention(s): Limited activity within patient's tolerance;Monitored during session(RN in with nausea med)    Home Living                      Prior Function            PT Goals (current  goals can now be found in the care plan section) Progress towards PT goals: Not progressing toward goals - comment(O2 sats dropped limiting activity; and pt with nausea)    Frequency    Min 3X/week      PT Plan Current plan remains appropriate    Co-evaluation              AM-PAC PT "6 Clicks" Mobility   Outcome Measure  Help needed turning from your back to your side while in a flat bed without using bedrails?: A Little Help needed moving from lying on your back to sitting on the side of a flat bed without using bedrails?: A Little Help needed moving to and from a bed to a chair (including a wheelchair)?: A Little Help needed standing up from a chair using your arms (e.g., wheelchair or bedside chair)?: A Little Help needed to walk in hospital room?: A Little Help needed climbing 3-5 steps with a railing? : A Lot 6 Click Score: 17    End of Session Equipment Utilized During  Treatment: Gait belt;Oxygen Activity Tolerance: Patient limited by fatigue Patient left: in chair;with call bell/phone within reach;with chair alarm set Nurse Communication: Mobility status PT Visit Diagnosis: Muscle weakness (generalized) (M62.81);Difficulty in walking, not elsewhere classified (R26.2)     Time: 5956-3875 PT Time Calculation (min) (ACUTE ONLY): 23 min  Charges:  $Gait Training: 8-22 mins $Therapeutic Activity: 8-22 mins                     Maggie Font, PT Acute Rehab Services [phone]    [name] 01/31/2019, 2:02 PM

## 2019-01-31 NOTE — Consult Note (Signed)
NAME:  Kara Hanson, MRN:  [id number], DOB:  Jan 04, 1956, LOS: 8 ADMISSION DATE:  01/23/2019, CONSULTATION DATE:  01/31/19 REFERRING MD:  Dr. Wyonia Hough, CHIEF COMPLAINT:  Shortness of breath, central chest discomfort   Brief History   63 y/o F, prior COVID-19 in April, admitted to [facility] in June 2020 (discharged on 3L O2) who presented with SOB, LE swelling and chest pain.  Treated as decompensated CHF.  [place] on 10/28 consistent with transudate.    History of present illness   63 y/o F who presented to [facility] via EMS on 10/21 with reports of shortness of breath.  Patient reported on admit that she has had progressively worsening shortness of breath for 1 week.  Patient noted that it was worse with lying flat or with exertion.  She has been sleeping upright in a chair for the past week prior to admission.  Additionally she reported bilateral lower extremity swelling and the sensation that she felt very fluid overloaded.  She had brief chest discomfort the day prior to admission that did not feel similar to her prior MI.  She also reported abdominal pain / nausea on presentation.   The patient was documented as having 3+ pitting edema up to her knees on admit.  Initial work-up included chest x-ray which was consistent with pulmonary edema and small bilateral pleural effusion. Labs notable for serum creatinine 1.47 up from 1.56, COVID negative.  She was admitted per [facility] for evaluation of suspected decompensated heart failure.    Cardiology was consulted.  She was reported to have had an 8lb weight gain since previous admit (admit weight with variable documentation and I/O's not accurate).  CT of the head was negative. Abdominal CT negative for acute process.  HS troponin negative.  Given prior normal coronaries on LHC in 2018, decision was made not to repeat ischemic evaluation.  Nephrology evaluated the patient for AKI.  Cultures to date are negative. The patient was actively diuresed with weight down to  165 lbs.  On 10/28 she underwent a left thoracentesis with 270 ml of light yellow fluid removed.  Pleural fluid - protein <3, total nucleated cells 244.  She continued to have central chest pain, repeat CXR concerning for ongoing left sided airspace disease.  PCCM consulted for evaluation.    Past Medical History  Spinal stenosis CVA COVID-19 Positive April 9th, admitted to [facility] 6/22-6/30 , discharged on 3L HTN Hypercholesterolemia MI - normal coronary arteries on LHC 2018 CHF-LVH 40-45% on 10/22, global hypokinesis, worse apically, impaired diastolic filling  CKD DM 2 Chronic hypoxemic respiratory failure on 4 L O2 after COVID-19 Anemia Significant Hospital Events   10/21 Admit  10/29 PCCM consulted   Consults:  PCCM   Procedures:     Significant Diagnostic Tests:   ECHO 10/22 >> LVEF ~40-45%, moderate LVH, global hypokinesis worse apically.  Impaired LV diastolic filling, elevated left atrial and LVEDP, LA /RA normal size, moderate pleural effusion, mild MVR, mildly elevated pulmonary artery systolic pressure  CT ABD / Pelvis 10/22 >> no acute findings, moderate bilateral pleural effusions with dependent atelectasis, colonic diverticulosis without evidence of acute diverticulitis, small umbilical hernia containing omental fat  CT Head 10/26 >> negative for intracranial hemorrhage, chronic microvascular ischemic changes, right mastoid effusions  Thoracentesis 10/28 >> transudate, LDH <3  CT Chest w/o 10/29 >> prominent secretions in the left mainstem, LUL, LLL with near complete collapse of the LUL, presence of air bronchograms may reflect PNA, small to moderate bilateral  pleural effusions  Micro Data:  COVID 10/21 >> negative UC 10/26 >> suggest recollect Left Pleural Fluid 10/28 >>  Left Pleural Cytology 10/28 >> negative  Antimicrobials:  Azithro 10/29 >>  Rocephin 10/26 >>   Interim history/subjective:  Pt continues to complain of central chest discomfort.   Afebrile.   Objective   Blood pressure 138/62, pulse 74, temperature (!) 97.4 F (36.3 C), temperature source Oral, resp. rate 16, height 4' 11.5" (1.511 m), weight 74.9 kg, SpO2 95 %.        Intake/Output Summary (Last 24 hours) at 01/31/2019 1516 Last data filed at 01/31/2019 1508 Gross per 24 hour  Intake 260 ml  Output 2050 ml  Net -1790 ml   Filed Weights   01/29/19 0607 01/30/19 0429 01/31/19 0500  Weight: 74.8 kg 74.8 kg 74.9 kg    Examination: General: adult female sitting up in chair in NAD  HEENT: MM pink/dry, anicteric, symmetrical  Neuro: AAOx4, speech clear, MAE, normal strength CV: s1s2 rrr, no m/r/g PULM:  Even/non-labored on [place] O2, absent breath sounds on left, clear on R GI: soft, bsx4 active  Extremities: warm/dry, no LE edema  Skin: no rashes or lesions  Resolved Hospital Problem list      Assessment & Plan:   Dyspnea  Complete Left Atelectasis / Collapse  Acute on Chronic Hypoxic Respiratory Failure Left Pleural Effusion  Hx of COVID in April 2020, admitted to [facility] in June 2020. Admitted with concern for possible CHF exacerbation.  Weights / intake records are unfortunately not accurate (note two weights on day of admit with large discrepancy).  She has new reduced LVEF, ? If this is residual from prior COVID infection given apical focus.  CT of the chest demonstrates complete collapse of left lung with debris in the left main bronchus.  Left pleural effusion is transudative in nature by description and protein.  Cytology negative.   -assess SLP evaluation for possible aspiration  -aggressive pulmonary hygiene - IS, mobilize, chest PT, nebulized hypertonic saline -follow intermittent CXR  -O2 as needed to support saturations >88% -hold further thoracentesis  -defer diuresis to Cardiology  -increase rocephin to CAP coverage  -doubt chest discomfort is due to lungs, more likely gastritis -NPO after MN, if CXR continues to have collapse, will plan for  FOB in am  Abd Pain secondary to Hemorrhagic Gastritis  -per GI / primary  -continue PPI   Best practice:  Diet: per primary  Pain/Anxiety/Delirium protocol (if indicated): n/a  VAP protocol (if indicated): n/a  DVT prophylaxis: enoxaparin  GI prophylaxis: PPI  Glucose control: SSI  Mobility: as tolerated  Code Status: Full Code  Family Communication: Patient updated on plan of care 10/29 Disposition: Per primary  Labs   CBC: Recent Labs  Lab 01/27/19 0440 01/30/19 0420  WBC 5.0 5.6  NEUTROABS 4.0  --   HGB 8.7* 8.9*  HCT 29.1* 30.1*  MCV 91.2 91.8  PLT 199 330    Basic Metabolic Panel: Recent Labs  Lab 01/27/19 0440 01/28/19 1012 01/29/19 0445 01/30/19 0420 01/31/19 0433  NA 135 135 136 137 138  K 4.6 4.5 4.0 3.7 3.6  CL 97* 96* 99 101 100  CO2 26 27 27 27 29   GLUCOSE 92 113* 92 85 100*  BUN 46* 52* 50* 47* 40*  CREATININE 2.40* 3.02* 2.70* 2.20* 1.92*  CALCIUM 9.2 9.0 8.9 8.8* 8.7*   GFR: Estimated Creatinine Clearance: 26.4 mL/min (A) (by C-G formula based on SCr of 1.92  mg/dL (H)). Recent Labs  Lab 01/27/19 0440 01/30/19 0420  WBC 5.0 5.6    Liver Function Tests: No results for input(s): AST, ALT, ALKPHOS, BILITOT, PROT, ALBUMIN in the last 168 hours. Recent Labs  Lab 01/26/19 1209  LIPASE 20   No results for input(s): AMMONIA in the last 168 hours.  ABG    Component Value Date/Time   HCO3 27.4 10/31/2018 1825   TCO2 29 10/31/2018 1825   ACIDBASEDEF 2.0 07/20/2018 1357   O2SAT 84.0 10/31/2018 1825     Coagulation Profile: No results for input(s): INR, PROTIME in the last 168 hours.  Cardiac Enzymes: No results for input(s): CKTOTAL, CKMB, CKMBINDEX, TROPONINI in the last 168 hours.  HbA1C: Hemoglobin A1C  Date/Time Value Ref Range Status  03/29/2018 11:19 AM 11.8 (A) 4.0 - 5.6 % Final   HbA1c, POC (controlled diabetic range)  Date/Time Value Ref Range Status  09/17/2018 02:23 PM 7.2 (A) 0.0 - 7.0 % Final  09/28/2017 03:40  PM 13.3 (A) 0.0 - 7.0 % Final   Hgb A1c MFr Bld  Date/Time Value Ref Range Status  01/09/2019 09:59 AM 7.8 (H) 4.8 - 5.6 % Final    Comment:             Prediabetes: 5.7 - 6.4          Diabetes: >6.4          Glycemic control for adults with diabetes: <7.0   11/27/2017 07:23 AM 9.6 (H) 4.8 - 5.6 % Final    Comment:    (NOTE) Pre diabetes:          5.7%-6.4% Diabetes:              >6.4% Glycemic control for   <7.0% adults with diabetes     CBG: Recent Labs  Lab 01/30/19 1340 01/30/19 1628 01/30/19 2203 01/31/19 0730 01/31/19 1157  GLUCAP 73 94 104* 89 103*    Review of Systems: Positives in [place]   Gen: Denies fever, chills, weight change, fatigue, night sweats HEENT: Denies blurred vision, double vision, hearing loss, tinnitus, sinus congestion, rhinorrhea, sore throat, neck stiffness, dysphagia PULM: Denies shortness of breath, cough, sputum production, hemoptysis, wheezing CV: Denies central chest pain that began after thoracentesis, edema, orthopnea, paroxysmal nocturnal dyspnea, palpitations GI: Denies abdominal pain, nausea, vomiting, diarrhea, hematochezia, melena, constipation, change in bowel habits GU: Denies dysuria, hematuria, polyuria, oliguria, urethral discharge Endocrine: Denies hot or cold intolerance, polyuria, polyphagia or appetite change Derm: Denies rash, dry skin, scaling or peeling skin change Heme: Denies easy bruising, bleeding, bleeding gums Neuro: Denies headache, numbness, weakness, slurred speech, loss of memory or consciousness  Past Medical History  She,  has a past medical history of CHF (congestive heart failure) ([place]), CVA (cerebral vascular accident) ([place]), Diabetes mellitus without complication ([place]), Hypercholesteremia, Hypertension, Myocardial infarction ([place]), Pneumonia (11/01/2018), and Spinal stenosis.   Surgical History    Past Surgical History:  Procedure Laterality Date   BIOPSY  01/27/2019   Procedure: BIOPSY;  Surgeon:  Otis Brace, MD;  Location: [facility];  Service: Gastroenterology;;   BLADDER SURGERY     CARDIAC CATHETERIZATION N/A 04/25/2016   Procedure: Left Heart Cath and Coronary Angiography;  Surgeon: Lorretta Harp, MD;  Location: [facility];  Service: Cardiovascular;  Laterality: N/A;   CESAREAN SECTION     CHOLECYSTECTOMY     ESOPHAGOGASTRODUODENOSCOPY (EGD) WITH PROPOFOL N/A 01/27/2019   Procedure: ESOPHAGOGASTRODUODENOSCOPY (EGD) WITH PROPOFOL;  Surgeon: Otis Brace, MD;  Location:  [facility] ENDOSCOPY;  Service: Gastroenterology;  Laterality: N/A;     Social History   reports that she has never smoked. She has never used smokeless tobacco. She reports that she does not drink alcohol or use drugs.   Family History   Her family history includes Diabetes Mellitus II in her father; Healthy in her mother; Stroke in her father.   Allergies Allergies  Allergen Reactions   Garlic Shortness Of Breath, Itching and Swelling    Hand itching and swelling   Latex Itching   Morphine And Related Itching and Other (See Comments)    Headache    Other Itching    Reaction to newspaper ink -itching and headache     Home Medications  Prior to Admission medications   Medication Sig Start Date End Date Taking? Authorizing Provider  allopurinol (ZYLOPRIM) 300 MG tablet Take 1 tablet (300 mg total) by mouth daily. 01/08/19  Yes [name], MD  amLODipine (NORVASC) 5 MG tablet Take 1 tablet (5 mg total) by mouth at bedtime. 01/08/19 02/07/19 Yes [name], MD  aspirin EC 81 MG tablet Take 81 mg by mouth daily.   Yes [provider]  atorvastatin (LIPITOR) 80 MG tablet Take 1 tablet (80 mg total) by mouth daily at 6 PM. 01/08/19 02/07/19 Yes Newlin, Charlane Ferretti, MD  carvedilol (COREG) 12.5 MG tablet Take 1 tablet (12.5 mg total) by mouth 2 (two) times daily with a meal. 01/08/19  Yes Newlin, Enobong, MD  DULoxetine (CYMBALTA) 60 MG capsule Take 1 capsule (60 mg total) by  mouth daily. For chronic back and neck pain Patient taking differently: Take 60 mg by mouth daily as needed. For chronic back and neck pain 01/08/19  Yes Newlin, Charlane Ferretti, MD  furosemide (LASIX) 40 MG tablet Take 1 tablet (40 mg total) by mouth daily. Patient taking differently: Take 40 mg by mouth 2 (two) times daily.  01/08/19  Yes [name], MD  gabapentin (NEURONTIN) 300 MG capsule Take 1 capsule (300 mg total) by mouth 2 (two) times daily. 01/08/19 02/07/19 Yes [name], MD  insulin aspart (NOVOLOG) 100 UNIT/ML injection 0 to 12 units subcutaneously 3 times daily before meals as per sliding scale 03/29/18  Yes Newlin, Enobong, MD  Insulin Glargine (BASAGLAR KWIKPEN) 100 UNIT/ML SOPN Inject 0.05 mLs (5 Units total) into the skin at bedtime. Patient taking differently: Inject 35 Units into the skin at bedtime.  10/02/18  Yes Bonnielee Haff, MD  lansoprazole (PREVACID) 15 MG capsule Take 15 mg by mouth daily.  10/02/18  Yes [provider]  omeprazole (PRILOSEC) 20 MG capsule Take 20 mg by mouth daily.   Yes [provider]  polyethylene glycol (MIRALAX / GLYCOLAX) 17 g packet Take 17 g by mouth daily. Patient taking differently: Take 17 g by mouth daily as needed for moderate constipation.  10/02/18  Yes Bonnielee Haff, MD  SUPER B COMPLEX/C CAPS Take 1 capsule by mouth daily.   Yes [provider]  Accu-Chek FastClix Lancets MISC USE AS DIRECTED TO TEST BLOOD SUGAR THREE TIMES DAILY 07/31/18   [name], MD  Blood Glucose Monitoring Suppl (ACCU-CHEK GUIDE) w/Device KIT 1 each by Does not apply route 3 (three) times daily. 07/31/18   [name], MD  EASY COMFORT PEN NEEDLES 31G X 5 MM MISC USE FOUR TIMES PER DAY FOR INSULIN ADMINISTRATION Patient taking differently: 4 (four) times daily.  10/02/18   [name], MD  ergocalciferol (DRISDOL) 1.25 MG (50000 UT) capsule Take 1 capsule (  50,000 Units total) by mouth once a week. 09/18/18   [name],  MD  glucose blood (ACCU-CHEK GUIDE) test strip USE AS DIRECTED TO TEST BLOOD SUGAR THREE TIMES DAILY 07/31/18   [name], MD  guaiFENesin-dextromethorphan (ROBITUSSIN DM) 100-10 MG/5ML syrup Take 10 mLs by mouth every 6 (six) hours. Patient not taking: Reported on 01/08/2019 11/05/18   Deatra James, MD  lactobacillus acidophilus & bulgar (LACTINEX) chewable tablet Chew 1 tablet by mouth 3 (three) times daily with meals. Patient not taking: Reported on 01/23/2019 11/05/18   Deatra James, MD  Lancet Device MISC Use as instructed 3 times daily 09/19/17   Rodell Perna A, PA-C  methylPREDNISolone (MEDROL) 4 MG tablet Take 1 tablet (4 mg total) by mouth daily. Take medication as directed per dose pack Patient not taking: Reported on 01/08/2019 11/05/18   Deatra James, MD  Misc. Devices MISC Portable oxygen concentrator.  Diagnosis-chronic respiratory failure. 09/03/18   [name], MD  Misc. Devices MISC [place] with seat. Dx: Congestive Heart Failure 10/19/18   [name], MD  ondansetron (ZOFRAN) 4 MG tablet Take 1 tablet (4 mg total) by mouth every 6 (six) hours as needed for nausea. 07/25/18   Charlynne Cousins, MD     Critical care time: n/a    Noe Gens, NP-C [facility] 01/31/2019, 3:17 PM

## 2019-01-31 NOTE — Care Management Important Message (Signed)
Important Message  Patient Details IM Letter given to Rhea Pink SW to present to the Patient Name: Kara Hanson MRN: [id number] Date of Birth: [birth year]   Medicare Important Message Given:  Yes     Kerin Salen 01/31/2019, 11:34 AM

## 2019-01-31 NOTE — Progress Notes (Signed)
Progress Note  Patient Name: Kara Hanson Date of Encounter: 01/31/2019  Primary Cardiologist: Candee Furbish, MD   Subjective   She complains on lower left sternal pain with inspiration.  Nauseated.  Breathing might be slightly better.   Inpatient Medications    Scheduled Meds:  allopurinol  150 mg Oral Daily   aspirin EC  81 mg Oral Daily   atorvastatin  80 mg Oral q1800   carvedilol  12.5 mg Oral BID WC   Chlorhexidine Gluconate Cloth  6 each Topical Daily   DULoxetine  60 mg Oral Daily   enoxaparin (LOVENOX) injection  30 mg Subcutaneous Q24H   furosemide  40 mg Intravenous Q8H   gabapentin  300 mg Oral BID   insulin aspart  0-15 Units Subcutaneous TID WC   insulin aspart  0-5 Units Subcutaneous QHS   insulin glargine  10 Units Subcutaneous QHS   isosorbide-hydrALAZINE  1 tablet Oral TID   pantoprazole (PROTONIX) IV  40 mg Intravenous Q12H   Continuous Infusions:  azithromycin 500 mg (01/31/19 1154)   cefTRIAXone (ROCEPHIN)  IV 1 g (01/31/19 1051)   PRN Meds: ondansetron (ZOFRAN) IV, promethazine   Vital Signs    Vitals:   01/30/19 1537 01/30/19 2232 01/31/19 0500 01/31/19 0615  BP:  (!) 143/64  138/63  Pulse:  82  76  Resp:  18  16  Temp:  97.7 F (36.5 C)  98.3 F (36.8 C)  TempSrc:  Oral  Oral  SpO2: (!) 89% 90%  (!) 89%  Weight:   74.9 kg   Height:        Intake/Output Summary (Last 24 hours) at 01/31/2019 1244 Last data filed at 01/31/2019 1000 Gross per 24 hour  Intake 260 ml  Output 1650 ml  Net -1390 ml   Last 3 Weights 01/31/2019 01/30/2019 01/29/2019  Weight (lbs) 165 lb 2 oz 164 lb 14.5 oz 164 lb 14.5 oz  Weight (kg) 74.9 kg 74.8 kg 74.8 kg      Telemetry    NSR - Personally Reviewed  ECG    NA - Personally Reviewed  Physical Exam   GEN: No  acute distress.   Neck: No  JVD Cardiac: RRR, no murmurs, rubs, or gallops.  Respiratory: Decreased breath sounds on the left base GI: Soft, nontender, non-distended,  normal bowel sounds  MS:  No edema; No deformity. Neuro:   Nonfocal  Psych: Oriented and appropriate    Labs    High Sensitivity Troponin:   Recent Labs  Lab 01/23/19 1343 01/23/19 1555 01/23/19 2034  TROPONINIHS 10 11 13       Chemistry Recent Labs  Lab 01/29/19 0445 01/30/19 0420 01/31/19 0433  NA 136 137 138  K 4.0 3.7 3.6  CL 99 101 100  CO2 27 27 29   GLUCOSE 92 85 100*  BUN 50* 47* 40*  CREATININE 2.70* 2.20* 1.92*  CALCIUM 8.9 8.8* 8.7*  GFRNONAA 18* 23* 27*  GFRAA 21* 27* 31*  ANIONGAP 10 9 9      Hematology Recent Labs  Lab 01/27/19 0440 01/30/19 0420  WBC 5.0 5.6  RBC 3.19* 3.28*  HGB 8.7* 8.9*  HCT 29.1* 30.1*  MCV 91.2 91.8  MCH 27.3 27.1  MCHC 29.9* 29.6*  RDW 14.8 14.7  PLT 199 235    BNP No results for input(s): BNP, PROBNP in the last 168 hours.   DDimer No results for input(s): DDIMER in the last 168 hours.   Radiology  Dg Chest 1 View  Result Date: 01/30/2019 CLINICAL DATA:  Status post left thoracentesis. EXAM: CHEST  1 VIEW COMPARISON:  January 29, 2019. FINDINGS: Stable cardiomediastinal silhouette. Increased left upper lobe opacity is noted concerning for pneumonia. Stable left basilar atelectasis and/or pleural effusion is noted. Right lung is clear. No pneumothorax is noted. Bony thorax is unremarkable. IMPRESSION: No pneumothorax is noted. Increased left upper lobe opacity is noted concerning for pneumonia. Stable left basilar atelectasis and pleural effusion is noted. Electronically Signed   By: Marijo Conception M.D.   On: 01/30/2019 12:32   Dg Chest Port 1 View  Result Date: 01/29/2019 CLINICAL DATA:  Dyspnea, shortness of breath, CHF, diabetic EXAM: PORTABLE CHEST 1 VIEW COMPARISON:  Radiograph 01/23/2019 FINDINGS: Veil like density in the left lung base suggesting a layering pleural effusion on a background of hazy interstitial opacities and cephalized, indistinct pulmonary vascularity with central vascular cuffing and  septal lines. Prominence the cardiac silhouette is similar to comparison exams. No visible pneumothorax. Degenerative changes are present in the imaged spine and shoulders. No acute osseous or soft tissue abnormality. IMPRESSION: 1. Findings consistent with mild to moderate CHF with interstitial edema. Suspect a moderate layering pleural effusion on the left. 2. Unchanged prominent cardiac silhouette, which could reflect cardiomegaly or pericardial effusion. Electronically Signed   By: Lovena Le M.D.   On: 01/29/2019 17:35   US Thoracentesis Asp Pleural Space W/img Guide  Result Date: 01/30/2019 INDICATION: Patient with history of CHF, chronic kidney disease, bilateral pleural effusions. Request received for diagnostic and therapeutic left thoracentesis. EXAM: ULTRASOUND GUIDED DIAGNOSTIC AND THERAPEUTIC LEFT THORACENTESIS MEDICATIONS: None COMPLICATIONS: None immediate. PROCEDURE: An ultrasound guided thoracentesis was thoroughly discussed with the patient and questions answered. The benefits, risks, alternatives and complications were also discussed. The patient understands and wishes to proceed with the procedure. Written consent was obtained. Ultrasound was performed to localize and mark an adequate pocket of fluid in the left chest. The area was then prepped and draped in the normal sterile fashion. 1% Lidocaine was used for local anesthesia. Under ultrasound guidance a 6 Fr Safe-T-Centesis catheter was introduced. Thoracentesis was performed. The catheter was removed and a dressing applied. FINDINGS: A total of approximately 270 cc of light yellow fluid was removed. Samples were sent to the laboratory as requested by the clinical team. IMPRESSION: Successful ultrasound guided diagnostic and therapeutic left thoracentesis yielding 270 cc of pleural fluid. Read by: Rowe Robert, PA-C Electronically Signed   By: Jacqulynn Cadet M.D.   On: 01/30/2019 12:19    Cardiac Studies   ECHO  01/24/19 IMPRESSIONS    1. Left ventricular ejection fraction, by visual estimation, is 40 to 45%. The left ventricle has normal function. There is moderately increased left ventricular hypertrophy.  2. Global hypokinesis, worse apically.  3. Left ventricular diastolic Doppler parameters are consistent with restrictive filling pattern of LV diastolic filling.  4. Elevated left atrial and left ventricular end-diastolic pressures.  5. Global right ventricle has mildly reduced systolic function.The right ventricular size is normal. No increase in right ventricular wall thickness.  6. Left atrial size was normal.  7. Right atrial size was normal.  8. Moderate pleural effusion in the left lateral region.  9. The mitral valve is abnormal. Mild mitral valve regurgitation. 10. The tricuspid valve is grossly normal. Tricuspid valve regurgitation is mild. 11. The aortic valve is tricuspid Aortic valve regurgitation is trivial by color flow Doppler. Mild aortic valve sclerosis without stenosis. 12. The  pulmonic valve was grossly normal. Pulmonic valve regurgitation is trivial by color flow Doppler. 13. Mildly elevated pulmonary artery systolic pressure. 14. The inferior vena cava is normal in size with <50% respiratory variability, suggesting right atrial pressure of 8 mmHg.    Patient Profile     63 y.o. female with a history of normal coronary arteries on cath in 2018, possible stress-induced cardiomyopathy, hypertension, hyperlipidemia, and prior CVA who was admitted on 01/23/2019 for acute on chronic combine CHF after presenting with worsening dyspnea, orthopnea, PND, lower extremity and weight gain. Cardiology consulted for assistance. Patient has also been complaining of significant abdominal pain and nausea for which GI is now following.  Assessment & Plan    Acute on Chronic Combined CHF  Lasix was on hold.  However, the creat rose but responded to a foley being placed.  Nephrology has  been following.  She was restarted on IV Lasix yesterday.   Looks like she was net negative about a liter yesterday but I/O likely incomplete.   Weight is unchanged.  I would agree with continued IV diuresis as her creat allows.  She had a thoracentesis yesterday with 250 cc removed.  CXR follow up was concerning for pneumonia.   She had moderate WBC on the Gram stain of the fluid.    I don't see an LDH on the pleural fluid.  Protein was less than 3 and the ratio (using 10/22 serum protein) would favor an exudate suggesting this is not CHF.  I discussed this with the primary team.   Hypertension  BP controlled in the context of treating the mildly reduced EF.    Hyperlipidemia Continue statin.    Acute on Chronic Kidney Disease Stage III :  Creat as above.   For questions or updates, please contact [place] Please consult [email or web address] for contact info under        Signed, Minus Breeding, MD  01/31/2019, 12:44 PM

## 2019-01-31 NOTE — Progress Notes (Signed)
Pt sleeping soundly at change of shift, no distress noted.  Spoke with daughter in regards to [place]. Pt woke around 2200, offered no complaints but was upset about her condition.  Explained the POC denies pain or SOB, will continue to monitor t/o shift

## 2019-01-31 NOTE — Progress Notes (Signed)
[facility] Progress Note  Subjective: UOP good on IV lasix, creat down 1.9 today.  Pt denies SOB, on usual [place] O2.  Vague historian.   Vitals:   01/30/19 2232 01/31/19 0500 01/31/19 0615 01/31/19 1412  BP: (!) 143/64  138/63 138/62  Pulse: 82  76 74  Resp: 18  16 16   Temp: 97.7 F (36.5 C)  98.3 F (36.8 C) (!) 97.4 F (36.3 C)  TempSrc: Oral  Oral Oral  SpO2: 90%  (!) 89% 95%  Weight:  74.9 kg    Height:        Inpatient medications: . allopurinol  150 mg Oral Daily  . aspirin EC  81 mg Oral Daily  . atorvastatin  80 mg Oral q1800  . carvedilol  12.5 mg Oral BID WC  . Chlorhexidine Gluconate Cloth  6 each Topical Daily  . enoxaparin (LOVENOX) injection  30 mg Subcutaneous Q24H  . furosemide  40 mg Intravenous Q8H  . gabapentin  300 mg Oral BID  . insulin aspart  0-15 Units Subcutaneous TID WC  . insulin aspart  0-5 Units Subcutaneous QHS  . insulin glargine  10 Units Subcutaneous QHS  . isosorbide-hydrALAZINE  1 tablet Oral TID  . pantoprazole (PROTONIX) IV  40 mg Intravenous Q12H   . azithromycin 500 mg (01/31/19 1154)  . cefTRIAXone (ROCEPHIN)  IV 1 g (01/31/19 1051)   ondansetron (ZOFRAN) IV, promethazine    Exam: Gen much more alert, no distress , nasal O2 Chest clear on R, L dec'd RRR no MRG Abd soft ntnd no mass or ascites +bs GU foley in place w/ amber urine , clear MS no joint effusions or deformity Ext no LE or UE edema Neuro is alert, Ox 3 , nf   CXR 10/28 > large L effusion, no pulm edema otherwise  Assessment/ Plan: 1. AKI on CKD III - baseline creat 1.1- 1.5.  AKI due to urinary retention. Resolving w/ foley in.  Also has decomp CHF which worsened requiring L thoracentesis so started back on IV lasix which she is tolerating so far. Continue lasix.  2. HTN - ok to continue bidil and coreg, holding orders written. Keep BP > 110 3. Acute/chronic CHF - decomp CHF, diuresing okay today on 40mg  IV lasix tid.  Will continue, f/u labs in  am 4. Acute/ chronic resp failure - stable on [place] O2, is on home O2 not sure why 5. Abd pain - EGD showed hemorrhagic gastritis, no active bleeding. PPI Rx 6. DM2 7. H/ o CVA   Rob Kylian Loh 01/31/2019, 3:37 PM  Iron/TIBC/Ferritin/ %Sat    Component Value Date/Time   FERRITIN 77 10/31/2018 2056   Recent Labs  Lab 01/31/19 0433  NA 138  K 3.6  CL 100  CO2 29  GLUCOSE 100*  BUN 40*  CREATININE 1.92*  CALCIUM 8.7*   No results for input(s): AST, ALT, ALKPHOS, BILITOT, PROT in the last 168 hours. Recent Labs  Lab 01/30/19 0420  WBC 5.6  HGB 8.9*  HCT 30.1*  PLT 235

## 2019-02-01 ENCOUNTER — Inpatient Hospital Stay (HOSPITAL_COMMUNITY): Payer: Medicare Other

## 2019-02-01 ENCOUNTER — Encounter (HOSPITAL_COMMUNITY)
Admission: EM | Disposition: A | Discharge status: Home Health | Payer: Self-pay | Source: Home / Self Care | Attending: Internal Medicine

## 2019-02-01 DIAGNOSIS — J9811 Atelectasis: Secondary | ICD-10-CM | POA: Diagnosis not present

## 2019-02-01 DIAGNOSIS — J9621 Acute and chronic respiratory failure with hypoxia: Secondary | ICD-10-CM | POA: Diagnosis not present

## 2019-02-01 DIAGNOSIS — J9610 Chronic respiratory failure, unspecified whether with hypoxia or hypercapnia: Secondary | ICD-10-CM

## 2019-02-01 DIAGNOSIS — I5043 Acute on chronic combined systolic (congestive) and diastolic (congestive) heart failure: Secondary | ICD-10-CM | POA: Diagnosis not present

## 2019-02-01 HISTORY — PX: VIDEO BRONCHOSCOPY: SHX5072

## 2019-02-01 LAB — BLOOD GAS, ARTERIAL
Acid-Base Excess: 4.5 mmol/L — ABNORMAL HIGH (ref 0.0–2.0)
Bicarbonate: 28.1 mmol/L — ABNORMAL HIGH (ref 20.0–28.0)
Drawn by: 295031
FIO2: 100
MECHVT: 460 mL
O2 Saturation: 99.7 %
PEEP: 5 cmH2O
Patient temperature: 98.6
RATE: 16 resp/min
pCO2 arterial: 39.8 mmHg (ref 32.0–48.0)
pH, Arterial: 7.463 — ABNORMAL HIGH (ref 7.350–7.450)
pO2, Arterial: 257 mmHg — ABNORMAL HIGH (ref 83.0–108.0)

## 2019-02-01 LAB — BASIC METABOLIC PANEL
Anion gap: 8 (ref 5–15)
BUN: 36 mg/dL — ABNORMAL HIGH (ref 8–23)
CO2: 30 mmol/L (ref 22–32)
Calcium: 8.8 mg/dL — ABNORMAL LOW (ref 8.9–10.3)
Chloride: 99 mmol/L (ref 98–111)
Creatinine, Ser: 1.83 mg/dL — ABNORMAL HIGH (ref 0.44–1.00)
GFR calc Af Amer: 33 mL/min — ABNORMAL LOW (ref >60)
GFR calc non Af Amer: 29 mL/min — ABNORMAL LOW (ref >60)
Glucose, Bld: 92 mg/dL (ref 70–99)
Potassium: 3.8 mmol/L (ref 3.5–5.1)
Sodium: 137 mmol/L (ref 135–145)

## 2019-02-01 LAB — CBC WITH DIFFERENTIAL/PLATELET
Abs Immature Granulocytes: 0.01 10*3/uL (ref 0.00–0.07)
Basophils Absolute: 0 10*3/uL (ref 0.0–0.1)
Basophils Relative: 0 %
Eosinophils Absolute: 0.1 10*3/uL (ref 0.0–0.5)
Eosinophils Relative: 3 %
HCT: 30 % — ABNORMAL LOW (ref 36.0–46.0)
Hemoglobin: 9.1 g/dL — ABNORMAL LOW (ref 12.0–15.0)
Immature Granulocytes: 0 %
Lymphocytes Relative: 12 %
Lymphs Abs: 0.5 10*3/uL — ABNORMAL LOW (ref 0.7–4.0)
MCH: 27.5 pg (ref 26.0–34.0)
MCHC: 30.3 g/dL (ref 30.0–36.0)
MCV: 90.6 fL (ref 80.0–100.0)
Monocytes Absolute: 0.4 10*3/uL (ref 0.1–1.0)
Monocytes Relative: 10 %
Neutro Abs: 3.5 10*3/uL (ref 1.7–7.7)
Neutrophils Relative %: 75 %
Platelets: 240 10*3/uL (ref 150–400)
RBC: 3.31 MIL/uL — ABNORMAL LOW (ref 3.87–5.11)
RDW: 14.6 % (ref 11.5–15.5)
WBC: 4.6 10*3/uL (ref 4.0–10.5)
nRBC: 0 % (ref 0.0–0.2)

## 2019-02-01 LAB — PROCALCITONIN: Procalcitonin: <0.1 ng/mL

## 2019-02-01 LAB — GLUCOSE, CAPILLARY
Glucose-Capillary: 83 mg/dL (ref 70–99)
Glucose-Capillary: 79 mg/dL (ref 70–99)
Glucose-Capillary: 71 mg/dL (ref 70–99)
Glucose-Capillary: 70 mg/dL (ref 70–99)
Glucose-Capillary: 75 mg/dL (ref 70–99)
Glucose-Capillary: 70 mg/dL (ref 70–99)

## 2019-02-01 LAB — TRIGLYCERIDES: Triglycerides: 108 mg/dL (ref <150)

## 2019-02-01 LAB — MRSA PCR SCREENING: MRSA by PCR: NEGATIVE

## 2019-02-01 SURGERY — VIDEO BRONCHOSCOPY WITHOUT FLUORO
Anesthesia: Moderate Sedation

## 2019-02-01 MED ORDER — CHLORHEXIDINE GLUCONATE 0.12% ORAL RINSE (MEDLINE KIT)
15.0000 mL | Freq: Two times a day (BID) | OROMUCOSAL | Status: DC
  Administered 2019-02-01 – 2019-02-05 (×7): 15 mL via OROMUCOSAL

## 2019-02-01 MED ORDER — MIDAZOLAM HCL (PF) 5 MG/ML IJ SOLN
INTRAMUSCULAR | Status: AC
  Filled 2019-02-01: qty 2

## 2019-02-01 MED ORDER — FENTANYL CITRATE (PF) 100 MCG/2ML IJ SOLN
25.0000 ug | INTRAMUSCULAR | Status: DC | PRN
  Administered 2019-02-01: 16:00:00 25 ug via INTRAVENOUS
  Filled 2019-02-01: qty 2

## 2019-02-01 MED ORDER — FENTANYL CITRATE (PF) 100 MCG/2ML IJ SOLN
INTRAMUSCULAR | Status: DC | PRN
  Administered 2019-02-01 (×4): 50 ug via INTRAVENOUS

## 2019-02-01 MED ORDER — LIDOCAINE HCL URETHRAL/MUCOSAL 2 % EX GEL: 1.0000 "application " | Freq: Once | CUTANEOUS | Status: DC

## 2019-02-01 MED ORDER — PROPOFOL 1000 MG/100ML IV EMUL
5.0000 ug/kg/min | INTRAVENOUS | Status: DC
  Administered 2019-02-01: 35 ug/kg/min via INTRAVENOUS
  Administered 2019-02-01: 17:00:00 5 ug/kg/min via INTRAVENOUS
  Administered 2019-02-02 (×2): 35 ug/kg/min via INTRAVENOUS
  Filled 2019-02-01 (×3): qty 100

## 2019-02-01 MED ORDER — SODIUM CHLORIDE 0.9 % IV SOLN
INTRAVENOUS | Status: AC | PRN
  Administered 2019-02-01: 500 mL via INTRAMUSCULAR

## 2019-02-01 MED ORDER — MIDAZOLAM HCL (PF) 10 MG/2ML IJ SOLN
INTRAMUSCULAR | Status: DC | PRN
  Administered 2019-02-01: 2 mg via INTRAVENOUS
  Administered 2019-02-01: 3 mg via INTRAVENOUS
  Administered 2019-02-01 (×2): 2 mg via INTRAVENOUS

## 2019-02-01 MED ORDER — CLONIDINE ORAL SUSPENSION 10 MCG/ML: 0.3000 mg | Freq: Once | ORAL | Status: DC

## 2019-02-01 MED ORDER — PHENYLEPHRINE HCL 0.25 % NA SOLN: 1.0000 | Freq: Four times a day (QID) | NASAL | Status: DC | PRN

## 2019-02-01 MED ORDER — BUTAMBEN-TETRACAINE-BENZOCAINE 2-2-14 % EX AERO: 1.0000 | INHALATION_SPRAY | Freq: Once | CUTANEOUS | Status: DC

## 2019-02-01 MED ORDER — ORAL CARE MOUTH RINSE
15.0000 mL | OROMUCOSAL | Status: DC
  Administered 2019-02-01 – 2019-02-04 (×22): 15 mL via OROMUCOSAL

## 2019-02-01 MED ORDER — FENTANYL CITRATE (PF) 100 MCG/2ML IJ SOLN
INTRAMUSCULAR | Status: AC
  Filled 2019-02-01: qty 4

## 2019-02-01 MED ORDER — LABETALOL HCL 5 MG/ML IV SOLN
20.0000 mg | INTRAVENOUS | Status: DC | PRN
  Administered 2019-02-01: 20 mg via INTRAVENOUS
  Filled 2019-02-01: qty 4

## 2019-02-01 MED ORDER — FUROSEMIDE 10 MG/ML IJ SOLN
40.0000 mg | Freq: Two times a day (BID) | INTRAMUSCULAR | Status: DC
  Administered 2019-02-01 – 2019-02-04 (×6): 40 mg via INTRAVENOUS
  Filled 2019-02-01 (×6): qty 4

## 2019-02-01 MED ORDER — LIDOCAINE VISCOUS HCL 2 % MT SOLN
OROMUCOSAL | Status: AC
  Filled 2019-02-01: qty 15

## 2019-02-01 MED ORDER — CLEVIDIPINE BUTYRATE 0.5 MG/ML IV EMUL
0.0000 mg/h | INTRAVENOUS | Status: DC
  Administered 2019-02-01: 1 mg/h via INTRAVENOUS
  Administered 2019-02-02: 5 mg/h via INTRAVENOUS
  Filled 2019-02-01 (×2): qty 50

## 2019-02-01 MED ORDER — DEXMEDETOMIDINE HCL IN NACL 200 MCG/50ML IV SOLN
0.4000 ug/kg/h | INTRAVENOUS | Status: DC
  Administered 2019-02-01: 16:00:00 0.4 ug/kg/h via INTRAVENOUS
  Filled 2019-02-01 (×2): qty 50

## 2019-02-01 NOTE — Progress Notes (Signed)
NAME:  Kara Hanson, MRN:  [id number], DOB:  04/01/1956, LOS: 9 ADMISSION DATE:  01/23/2019, CONSULTATION DATE:  01/31/19 REFERRING MD:  Dr. Wyonia Hough, CHIEF COMPLAINT:  Shortness of breath, central chest discomfort   Brief History   63 y/o F, prior COVID-19 in April, admitted to [facility] in June 2020 (discharged on 3L O2) who presented with SOB, LE swelling and chest pain.  Treated as decompensated CHF.  Thora on 10/28 consistent with transudate.    History of present illness   63 y/o F who presented to [facility] via EMS on 10/21 with reports of shortness of breath.  Patient reported on admit that she has had progressively worsening shortness of breath for 1 week.  Patient noted that it was worse with lying flat or with exertion.  She has been sleeping upright in a chair for the past week prior to admission.  Additionally she reported bilateral lower extremity swelling and the sensation that she felt very fluid overloaded.  She had brief chest discomfort the day prior to admission that did not feel similar to her prior MI.  She also reported abdominal pain / nausea on presentation.   The patient was documented as having 3+ pitting edema up to her knees on admit.  Initial work-up included chest x-ray which was consistent with pulmonary edema and small bilateral pleural effusion. Labs notable for serum creatinine 1.47 up from 1.56, COVID negative.  She was admitted per [facility] for evaluation of suspected decompensated heart failure.    Cardiology was consulted.  She was reported to have had an 8lb weight gain since previous admit (admit weight with variable documentation and I/O's not accurate).  CT of the head was negative. Abdominal CT negative for acute process.  HS troponin negative.  Given prior normal coronaries on LHC in 2018, decision was made not to repeat ischemic evaluation.  Nephrology evaluated the patient for AKI.  Cultures to date are negative. The patient was actively diuresed with weight down to  165 lbs.  On 10/28 she underwent a left thoracentesis with 270 ml of light yellow fluid removed.  Pleural fluid - protein <3, total nucleated cells 244.  She continued to have central chest pain, repeat CXR concerning for ongoing left sided airspace disease.  PCCM consulted for evaluation.    Past Medical History  Spinal stenosis CVA COVID-19 Positive April 9th, admitted to [facility] 6/22-6/30 , discharged on 3L HTN Hypercholesterolemia MI - normal coronary arteries on LHC 2018 CHF-LVH 40-45% on 10/22, global hypokinesis, worse apically, impaired diastolic filling  CKD DM 2 Chronic hypoxemic respiratory failure on 4 L O2 after COVID-19 Anemia Significant Hospital Events   10/21 Admit  10/29 PCCM consulted   Consults:  PCCM   Procedures:     Significant Diagnostic Tests:   ECHO 10/22 >> LVEF ~40-45%, moderate LVH, global hypokinesis worse apically.  Impaired LV diastolic filling, elevated left atrial and LVEDP, LA /RA normal size, moderate pleural effusion, mild MVR, mildly elevated pulmonary artery systolic pressure  CT ABD / Pelvis 10/22 >> no acute findings, moderate bilateral pleural effusions with dependent atelectasis, colonic diverticulosis without evidence of acute diverticulitis, small umbilical hernia containing omental fat  CT Head 10/26 >> negative for intracranial hemorrhage, chronic microvascular ischemic changes, right mastoid effusions  Thoracentesis 10/28 >> transudate, LDH <3  CT Chest w/o 10/29 >> prominent secretions in the left mainstem, LUL, LLL with near complete collapse of the LUL, presence of air bronchograms may reflect PNA, small to moderate bilateral  pleural effusions  Micro Data:  COVID 10/21 >> negative UC 10/26 >> suggest recollect Left Pleural Fluid 10/28 >>  Left Pleural Cytology 10/28 >> negative  Antimicrobials:  Azithro 10/29 >>  Rocephin 10/26 >>   Interim history/subjective:  Still having epigastric discomfort and dry cough  limiting sleep.  Objective   Blood pressure (!) 160/70, pulse 77, temperature 98 F (36.7 C), temperature source Oral, resp. rate 20, height 5' 5.51" (1.664 m), weight 100.4 kg, SpO2 95 %.        Intake/Output Summary (Last 24 hours) at 02/01/2019 0914 Last data filed at 02/01/2019 0600 Gross per 24 hour  Intake 800 ml  Output 925 ml  Net -125 ml   Filed Weights   01/30/19 0429 01/31/19 0500 02/01/19 0500  Weight: 74.8 kg 74.9 kg 100.4 kg    Examination: GEN: elderly woman in NAD, coughing HEENT: malampatti 2, MMM CV: RRR, ext warm PULM: Absent left, clear R, shallow inspiratory efforts GI: Soft, +BS EXT: No edema NEURO: Moves all 4 ext to command PSYCH: RASS 0 SKIN: No rashes  -1.1L over 24h Renal function stable Procal neg  Resolved Hospital Problem list      Assessment & Plan:  # Continued left lung atelectasis due to weakness, poor pulmonary toileting and question of something structural as she has had multiple left sided pneumonias in past per daughter # Left effusion- cytology neg, transudative, would not worry about # Muscular deconditioning acquired from multiple hospitalizations # CKD, HF- does not really appear volume up at this time but is tolerating diureses so fine with me  - Stop Abx - NPO, diagnostic and therapeutic bronchoscopy today 2PM - SLP eval pending - Will need to be aggressive with mobility after this - Discussed with daughter and patient, agreeable to proceed - Diuretics etc per cards/renal - Will follow through weekend  Erskine Emery MD

## 2019-02-01 NOTE — Progress Notes (Signed)
PT Cancellation Note  Patient Details Name: Jadzia Ibsen MRN: [id number] DOB: 1955/12/09   Cancelled Treatment:    Reason Eval/Treat Not Completed: Medical issues which prohibited therapy Pt s/p bronchoscopy and was intubated and transferred to ICU.  Will hold PT today or until medically stable.    Mikael Spray Savvas Roper 02/01/2019, 3:57 PM

## 2019-02-01 NOTE — Progress Notes (Signed)
Called report to ICU RN, pt being admitted to ICU post Bronchoscopy procedure. Received called from ENDO RN stating pt will not return to department due to change on condition. Pt personal belonging placed in bag and labeled and tied, including Cellphone, charger, purse, clothing and bedroom shoes.  SRP, RN

## 2019-02-01 NOTE — Progress Notes (Signed)
SLP Cancellation Note  Patient Details Name: Kara Hanson MRN: [id number] DOB: Jan 07, 1956   Cancelled treatment:       Reason Eval/Treat Not Completed: Medical issues which prohibited therapy Orders received for BSE. Pt is currently NPO for testing/procedures. Per RN, pt tolerated po meds with liquid without difficulty this morning. Will continue efforts.  Celia B. Quentin Ore, [facility], [place] Speech Language Pathologist Office: [phone] Pager:[phone]  Shonna Chock 02/01/2019, 9:49 AM

## 2019-02-01 NOTE — Progress Notes (Signed)
Progress Note  Patient Name: Kara Hanson Date of Encounter: 02/01/2019  Primary Cardiologist: Candee Furbish, MD   Subjective   Coughing.  Still with pleuritic pain.    Inpatient Medications    Scheduled Meds: . albuterol  2.5 mg Nebulization TID  . allopurinol  150 mg Oral Daily  . aspirin EC  81 mg Oral Daily  . atorvastatin  80 mg Oral q1800  . carvedilol  12.5 mg Oral BID WC  . Chlorhexidine Gluconate Cloth  6 each Topical Daily  . dextromethorphan-guaiFENesin  1 tablet Oral BID  . enoxaparin (LOVENOX) injection  30 mg Subcutaneous Q24H  . furosemide  40 mg Intravenous Q12H  . gabapentin  300 mg Oral BID  . insulin aspart  0-15 Units Subcutaneous TID WC  . insulin aspart  0-5 Units Subcutaneous QHS  . insulin glargine  10 Units Subcutaneous QHS  . isosorbide-hydrALAZINE  1 tablet Oral TID  . pantoprazole (PROTONIX) IV  40 mg Intravenous Q12H  . sodium chloride HYPERTONIC  4 mL Nebulization BID   Continuous Infusions: . azithromycin 500 mg (02/01/19 1211)   PRN Meds: albuterol, ondansetron (ZOFRAN) IV, promethazine   Vital Signs    Vitals:   02/01/19 0500 02/01/19 0618 02/01/19 0824 02/01/19 1246  BP: (!) 156/76 (!) 160/70  (!) 123/57  Pulse: 76 77  80  Resp: 18 20  16   Temp: 98.6 F (37 C) 98 F (36.7 C)  98.4 F (36.9 C)  TempSrc: Oral Oral  Oral  SpO2: 95% 98% 95% 95%  Weight: 100.4 kg     Height: 5' 5.51" (1.664 m)       Intake/Output Summary (Last 24 hours) at 02/01/2019 1305 Last data filed at 02/01/2019 0600 Gross per 24 hour  Intake 600 ml  Output 925 ml  Net -325 ml   Last 3 Weights 02/01/2019 01/31/2019 01/30/2019  Weight (lbs) 221 lb 5.5 oz 165 lb 2 oz 164 lb 14.5 oz  Weight (kg) 100.4 kg 74.9 kg 74.8 kg      Telemetry    NSR, PACs - Personally Reviewed  ECG    NA - Personally Reviewed  Physical Exam   GEN: No  acute distress.   Neck: No JVD Cardiac: RRR, no murmurs, rubs, or gallops.  Respiratory: Clear   to  auscultation bilaterally. GI: Soft, nontender, non-distended, normal bowel sounds  MS:  Trace edema; No deformity. Neuro:   Nonfocal  Psych: Oriented and appropriate    Labs    High Sensitivity Troponin:   Recent Labs  Lab 01/23/19 1343 01/23/19 1555 01/23/19 2034  TROPONINIHS 10 11 13       Chemistry Recent Labs  Lab 01/30/19 0420 01/31/19 0433 02/01/19 0413  NA 137 138 137  K 3.7 3.6 3.8  CL 101 100 99  CO2 27 29 30   GLUCOSE 85 100* 92  BUN 47* 40* 36*  CREATININE 2.20* 1.92* 1.83*  CALCIUM 8.8* 8.7* 8.8*  GFRNONAA 23* 27* 29*  GFRAA 27* 31* 33*  ANIONGAP 9 9 8      Hematology Recent Labs  Lab 01/27/19 0440 01/30/19 0420 02/01/19 0413  WBC 5.0 5.6 4.6  RBC 3.19* 3.28* 3.31*  HGB 8.7* 8.9* 9.1*  HCT 29.1* 30.1* 30.0*  MCV 91.2 91.8 90.6  MCH 27.3 27.1 27.5  MCHC 29.9* 29.6* 30.3  RDW 14.8 14.7 14.6  PLT 199 235 240    BNP No results for input(s): BNP, PROBNP in the last 168 hours.   DDimer No  results for input(s): DDIMER in the last 168 hours.   Radiology    Dg Chest 2 View  Result Date: 02/01/2019 CLINICAL DATA:  Chest pain. EXAM: CHEST - 2 VIEW COMPARISON:  CT 01/31/2019. FINDINGS: Opacification of the left hemithorax again noted consistent with left lung atelectasis. Reference is made to prior CT report. Small right pleural effusion. No pneumothorax. Heart size cannot be evaluated. Degenerative change thoracic spine IMPRESSION: Opacification left hemithorax again noted consistent with left lung atelectasis again noted. No change from prior CT. Reference is made to prior CT report 01/31/2019. Electronically Signed   By: [place]   On: 02/01/2019 11:42   Ct Chest Wo Contrast  Result Date: 01/31/2019 CLINICAL DATA:  Shortness of breath. EXAM: CT CHEST WITHOUT CONTRAST TECHNIQUE: Multidetector CT imaging of the chest was performed following the standard protocol without IV contrast. COMPARISON:  Chest x-ray from yesterday. CTA chest dated  November 27, 2017. FINDINGS: Cardiovascular: Unchanged mild cardiomegaly. No pericardial effusion. No thoracic aortic aneurysm. Coronary, aortic arch, and branch vessel atherosclerotic vascular disease. Mediastinum/Nodes: No pathologically enlarged mediastinal or axillary lymph nodes. Several prominent mediastinal lymph nodes measuring up to 9 mm in short axis are likely reactive. The thyroid gland, trachea, and esophagus demonstrate no significant findings. Lungs/Pleura: Prominent secretions within the left mainstem bronchus and left upper and lower lobe bronchi with near complete collapse of the left upper lobe and complete collapse of the left lower lobe. The presence of air bronchograms in the left upper and lower lobes is suggestive of superimposed consolidation. Small to moderate bilateral pleural effusions. Dependent atelectasis in the right upper and lower lobes. Scattered mild interlobular septal thickening in the right lung. No pneumothorax. Upper Abdomen: No acute abnormality. Musculoskeletal: No chest wall mass or suspicious bone lesions identified. Mild anasarca. IMPRESSION: 1. Prominent secretions within the left mainstem and left upper and lower lobe bronchi with near complete collapse of the left upper lobe and complete collapse of the left lower lobe. The presence of air bronchograms in the left upper and lower lobes may reflect superimposed multifocal pneumonia. 2. Small to moderate bilateral pleural effusions. 3. Mild interstitial pulmonary edema. 4. Aortic atherosclerosis (ICD10-I70.0). Electronically Signed   By: Titus Dubin M.D.   On: 01/31/2019 15:51    Cardiac Studies   ECHO 01/24/19 IMPRESSIONS    1. Left ventricular ejection fraction, by visual estimation, is 40 to 45%. The left ventricle has normal function. There is moderately increased left ventricular hypertrophy.  2. Global hypokinesis, worse apically.  3. Left ventricular diastolic Doppler parameters are consistent with  restrictive filling pattern of LV diastolic filling.  4. Elevated left atrial and left ventricular end-diastolic pressures.  5. Global right ventricle has mildly reduced systolic function.The right ventricular size is normal. No increase in right ventricular wall thickness.  6. Left atrial size was normal.  7. Right atrial size was normal.  8. Moderate pleural effusion in the left lateral region.  9. The mitral valve is abnormal. Mild mitral valve regurgitation. 10. The tricuspid valve is grossly normal. Tricuspid valve regurgitation is mild. 11. The aortic valve is tricuspid Aortic valve regurgitation is trivial by color flow Doppler. Mild aortic valve sclerosis without stenosis. 12. The pulmonic valve was grossly normal. Pulmonic valve regurgitation is trivial by color flow Doppler. 13. Mildly elevated pulmonary artery systolic pressure. 14. The inferior vena cava is normal in size with <50% respiratory variability, suggesting right atrial pressure of 8 mmHg.    Patient Profile  63 y.o. female with a history of normal coronary arteries on cath in 2018, possible stress-induced cardiomyopathy, hypertension, hyperlipidemia, and prior CVA who was admitted on 01/23/2019 for acute on chronic combine CHF after presenting with worsening dyspnea, orthopnea, PND, lower extremity and weight gain. Cardiology consulted for assistance. Patient has also been complaining of significant abdominal pain and nausea for which GI is now following.  Assessment & Plan    Acute on Chronic Combined CHF  Net negative 2.3 liters.  Creat is stable.  Lasix has been ongoing. OK to continue if creat tolerates although I do not think that volume is the primary issues.   Pulmonary is seeing and they are planning a bronchoscopy.  They do not suspect any bacterial infection.   Hypertension  BP has been labile.    Hyperlipidemia  Continue statin.    Acute on Chronic Kidney Disease Stage III :   Creat as above.    We will see as needed.    For questions or updates, please contact [place] Please consult [email or web address] for contact info under        Signed, Minus Breeding, MD  02/01/2019, 1:05 PM

## 2019-02-01 NOTE — Progress Notes (Signed)
RT performed CPT with flutter valve with pt this morning, pt had very good effort but a weak, non-productive cough. Pt breath sounds improved slightly from beginning of CPT to the end. RT will continue to monitor.

## 2019-02-01 NOTE — Progress Notes (Signed)
CPT held at this time due to bronch procedure/intubation. Nursing staff at bedside with patient. RT will attempt at later time.

## 2019-02-01 NOTE — Progress Notes (Signed)
Kara Hanson Progress Note  Subjective: UOP 2L yest on IV lasix, creat down slightly at 1.8.  Intubated and in ICU now.   Vitals:   02/01/19 1515 02/01/19 1518 02/01/19 1537 02/01/19 1600  BP: (!) 101/59 (!) 126/54  (!) 171/88  Pulse: 84 80  73  Resp: 20 16  17   Temp:    97.6 F (36.4 C)  TempSrc:      SpO2: 100% 100% 100% 100%  Weight:      Height:        Inpatient medications: . albuterol  2.5 mg Nebulization TID  . allopurinol  150 mg Oral Daily  . aspirin EC  81 mg Oral Daily  . atorvastatin  80 mg Oral q1800  . carvedilol  12.5 mg Oral BID WC  . Chlorhexidine Gluconate Cloth  6 each Topical Daily  . dextromethorphan-guaiFENesin  1 tablet Oral BID  . enoxaparin (LOVENOX) injection  30 mg Subcutaneous Q24H  . furosemide  40 mg Intravenous Q12H  . gabapentin  300 mg Oral BID  . insulin aspart  0-15 Units Subcutaneous TID WC  . insulin aspart  0-5 Units Subcutaneous QHS  . insulin glargine  10 Units Subcutaneous QHS  . isosorbide-hydrALAZINE  1 tablet Oral TID  . pantoprazole (PROTONIX) IV  40 mg Intravenous Q12H  . sodium chloride HYPERTONIC  4 mL Nebulization BID   . azithromycin 500 mg (02/01/19 1211)  . dexmedetomidine (PRECEDEX) IV infusion 0.4 mcg/kg/hr (02/01/19 1537)   albuterol, fentaNYL (SUBLIMAZE) injection, ondansetron (ZOFRAN) IV, promethazine    Exam: Gen much more alert, no distress , nasal O2 Chest clear on R, L dec'd RRR no MRG Abd soft ntnd no mass or ascites +bs GU foley in place w/ amber urine , clear MS no joint effusions or deformity Ext soft doughy LE edema and UE edema 1-2 + Neuro is alert, Ox 3 , nf   CXR 10/28 > large L effusion, no pulm edema otherwise  Assessment/ Plan: 1. AKI on CKD III - baseline creat 1.1- 1.5.  AKI due to urinary retention. Resolving w/ foley in.  Still edema in legs and arms and recurrent L effusion. Not sure if L effusion is CHF related or not.  Tolerating IV lasix at this time, BP's up, so  will continue at 40mg  bid. Will follow.  2. HTN -  BP's up , on coreg/ Bidil per tube  , diuresing 3. Acute/chronic CHF - as above 4. Acute/ chronic resp failure - stable on Ava O2, is on home O2 not sure why 5. Abd pain - EGD showed hemorrhagic gastritis, no active bleeding. PPI Rx 6. DM2 7. H/ o CVA   Rob Loy Mccartt 02/01/2019, 5:03 PM  Iron/TIBC/Ferritin/ %Sat    Component Value Date/Time   FERRITIN 77 10/31/2018 2056   Recent Labs  Lab 02/01/19 0413  NA 137  K 3.8  CL 99  CO2 30  GLUCOSE 92  BUN 36*  CREATININE 1.83*  CALCIUM 8.8*   No results for input(s): AST, ALT, ALKPHOS, BILITOT, PROT in the last 168 hours. Recent Labs  Lab 02/01/19 0413  WBC 4.6  HGB 9.1*  HCT 30.0*  PLT 240

## 2019-02-01 NOTE — Progress Notes (Signed)
Patient did not tolerate bronch well from oxygenation standpoint but we were able to get her left side cleared out after intubating her.  Will let her rest overnight and SBT/extubate in AM.  Daughter updated, I can take over as primary until we get this all sorted out.  Erskine Emery MD

## 2019-02-01 NOTE — Procedures (Signed)
Diagnostic and therapeutic Bronch  Indication: atelectasis of left lung  Consent signed and in chart  Timeout performed  Patient already intubated, see separate note  Bronchoscope advanced into trachea with copious secretions occluding trachea at level of mainstem, these were meticulously suctioned and found to be originating from left mainstem.   Left mainstem suctioned down so that all subsegments were open.  Diffuse edematous bronchial mucosa found BL consistent with chronic bronchitis.  No endobronchial lesions.  BAL performed in lingula and sent for culture.  Patient will be watched in ICU overnight on vent.

## 2019-02-01 NOTE — Procedures (Signed)
Intubation Note  Shortly after giving fentanyl 2mcg and versed 2mg , patient sats dropped and we were only able to keep them up with BVM.  To facilitate airway clearance, made decision to intubate.  MAC3 glidescope used, 8-0 tube, intubated without difficulty.  Consent emergent but notified daughter afterwards.  CXR pending

## 2019-02-02 DIAGNOSIS — N179 Acute kidney failure, unspecified: Secondary | ICD-10-CM | POA: Diagnosis not present

## 2019-02-02 DIAGNOSIS — J9811 Atelectasis: Secondary | ICD-10-CM

## 2019-02-02 DIAGNOSIS — I5043 Acute on chronic combined systolic (congestive) and diastolic (congestive) heart failure: Secondary | ICD-10-CM | POA: Diagnosis not present

## 2019-02-02 DIAGNOSIS — I10 Essential (primary) hypertension: Secondary | ICD-10-CM | POA: Diagnosis not present

## 2019-02-02 DIAGNOSIS — N183 Chronic kidney disease, stage 3 unspecified: Secondary | ICD-10-CM | POA: Diagnosis not present

## 2019-02-02 LAB — CBC WITH DIFFERENTIAL/PLATELET
Abs Immature Granulocytes: 0.02 10*3/uL (ref 0.00–0.07)
Basophils Absolute: 0 10*3/uL (ref 0.0–0.1)
Basophils Relative: 0 %
Eosinophils Absolute: 0.1 10*3/uL (ref 0.0–0.5)
Eosinophils Relative: 2 %
HCT: 31.7 % — ABNORMAL LOW (ref 36.0–46.0)
Hemoglobin: 9.5 g/dL — ABNORMAL LOW (ref 12.0–15.0)
Immature Granulocytes: 0 %
Lymphocytes Relative: 13 %
Lymphs Abs: 0.8 10*3/uL (ref 0.7–4.0)
MCH: 27.1 pg (ref 26.0–34.0)
MCHC: 30 g/dL (ref 30.0–36.0)
MCV: 90.3 fL (ref 80.0–100.0)
Monocytes Absolute: 0.5 10*3/uL (ref 0.1–1.0)
Monocytes Relative: 9 %
Neutro Abs: 4.4 10*3/uL (ref 1.7–7.7)
Neutrophils Relative %: 76 %
Platelets: 277 10*3/uL (ref 150–400)
RBC: 3.51 MIL/uL — ABNORMAL LOW (ref 3.87–5.11)
RDW: 14.7 % (ref 11.5–15.5)
WBC: 5.8 10*3/uL (ref 4.0–10.5)
nRBC: 0 % (ref 0.0–0.2)

## 2019-02-02 LAB — BASIC METABOLIC PANEL
Anion gap: 13 (ref 5–15)
BUN: 35 mg/dL — ABNORMAL HIGH (ref 8–23)
CO2: 25 mmol/L (ref 22–32)
Calcium: 9.2 mg/dL (ref 8.9–10.3)
Chloride: 100 mmol/L (ref 98–111)
Creatinine, Ser: 1.77 mg/dL — ABNORMAL HIGH (ref 0.44–1.00)
GFR calc Af Amer: 35 mL/min — ABNORMAL LOW (ref >60)
GFR calc non Af Amer: 30 mL/min — ABNORMAL LOW (ref >60)
Glucose, Bld: 54 mg/dL — ABNORMAL LOW (ref 70–99)
Potassium: 3.5 mmol/L (ref 3.5–5.1)
Sodium: 138 mmol/L (ref 135–145)

## 2019-02-02 LAB — GLUCOSE, CAPILLARY
Glucose-Capillary: 47 mg/dL — ABNORMAL LOW (ref 70–99)
Glucose-Capillary: 157 mg/dL — ABNORMAL HIGH (ref 70–99)
Glucose-Capillary: 54 mg/dL — ABNORMAL LOW (ref 70–99)
Glucose-Capillary: 94 mg/dL (ref 70–99)
Glucose-Capillary: 129 mg/dL — ABNORMAL HIGH (ref 70–99)
Glucose-Capillary: 158 mg/dL — ABNORMAL HIGH (ref 70–99)

## 2019-02-02 LAB — PROCALCITONIN: Procalcitonin: <0.1 ng/mL

## 2019-02-02 MED ORDER — PHENOL 1.4 % MT LIQD
1.0000 | OROMUCOSAL | Status: DC | PRN
  Filled 2019-02-02: qty 177

## 2019-02-02 MED ORDER — AMLODIPINE BESYLATE 5 MG PO TABS
5.0000 mg | ORAL_TABLET | Freq: Every day | ORAL | Status: DC
  Administered 2019-02-02: 10:00:00 5 mg via ORAL
  Filled 2019-02-02: qty 1

## 2019-02-02 MED ORDER — DULOXETINE HCL 30 MG PO CPEP
30.0000 mg | ORAL_CAPSULE | Freq: Every day | ORAL | Status: DC
  Administered 2019-02-02 – 2019-02-07 (×6): 30 mg via ORAL
  Filled 2019-02-02 (×6): qty 1

## 2019-02-02 MED ORDER — DEXTROSE 50 % IV SOLN
INTRAVENOUS | Status: AC
  Administered 2019-02-02: 09:00:00 50 mL
  Filled 2019-02-02: qty 50

## 2019-02-02 MED ORDER — GABAPENTIN 100 MG PO CAPS
100.0000 mg | ORAL_CAPSULE | Freq: Two times a day (BID) | ORAL | Status: DC
  Administered 2019-02-02 – 2019-02-07 (×11): 100 mg via ORAL
  Filled 2019-02-02 (×12): qty 1

## 2019-02-02 MED ORDER — DEXTROSE 50 % IV SOLN
25.0000 g | INTRAVENOUS | Status: AC
  Administered 2019-02-02: 04:00:00 25 g via INTRAVENOUS
  Filled 2019-02-02: qty 50

## 2019-02-02 NOTE — Progress Notes (Signed)
Hypoglycemic Event  CBG: 47  Treatment: Dextrose 50 given  Symptoms: Pt was not symptomatic   Follow-up CBG: Time: 0430 CBG Result: 157  Possible Reasons for Event: Pt is NPO   Comments/MD notified: Yes, ELINK was notified     Camie Patience

## 2019-02-02 NOTE — Progress Notes (Signed)
Progress Note  Patient Name: Kara Hanson Date of Encounter: 02/02/2019  Primary Cardiologist: Candee Furbish, MD   Subjective   Underwent bronchoscopy yesterday, required intubation.  She remains intubated and sedated this morning, follows some commands.  Hypertensive to 200/70 overnight, on clevidipine gtt.  Cr improving (1.83->1.77).  Net negative 1.2L yesterday on IV lasix 40 mg BID.  Inpatient Medications    Scheduled Meds:  albuterol  2.5 mg Nebulization TID   allopurinol  150 mg Oral Daily   aspirin EC  81 mg Oral Daily   atorvastatin  80 mg Oral q1800   carvedilol  12.5 mg Oral BID WC   chlorhexidine gluconate (MEDLINE KIT)  15 mL Mouth Rinse BID   Chlorhexidine Gluconate Cloth  6 each Topical Daily   dextromethorphan-guaiFENesin  1 tablet Oral BID   enoxaparin (LOVENOX) injection  30 mg Subcutaneous Q24H   furosemide  40 mg Intravenous Q12H   gabapentin  300 mg Oral BID   insulin aspart  0-15 Units Subcutaneous TID WC   insulin aspart  0-5 Units Subcutaneous QHS   insulin glargine  10 Units Subcutaneous QHS   isosorbide-hydrALAZINE  1 tablet Oral TID   mouth rinse  15 mL Mouth Rinse 10 times per day   pantoprazole (PROTONIX) IV  40 mg Intravenous Q12H   sodium chloride HYPERTONIC  4 mL Nebulization BID   Continuous Infusions:  azithromycin 500 mg (02/01/19 1211)   clevidipine 5 mg/hr (02/02/19 0740)   dexmedetomidine (PRECEDEX) IV infusion Stopped (02/01/19 1716)   propofol (DIPRIVAN) infusion 25 mcg/kg/min (02/02/19 0740)   PRN Meds: albuterol, fentaNYL (SUBLIMAZE) injection, labetalol, ondansetron (ZOFRAN) IV, promethazine   Vital Signs    Vitals:   02/02/19 0600 02/02/19 0700 02/02/19 0715 02/02/19 0730  BP: (!) 172/62 (!) 170/60 (!) 167/63 (!) 152/57  Pulse: 63 66 63 66  Resp: _0 Temp:      TempSrc:      SpO2: 100% 100% 100% 100%  Weight:      Height:        Intake/Output Summary (Last 24 hours) at 02/02/2019  0758 Last data filed at 02/02/2019 0740 Gross per 24 hour  Intake 531.38 ml  Output 1675 ml  Net -1143.62 ml   Last 3 Weights 02/02/2019 02/01/2019 01/31/2019  Weight (lbs) 164 lb 10.9 oz 221 lb 5.5 oz 165 lb 2 oz  Weight (kg) 74.7 kg 100.4 kg 74.9 kg      Telemetry    NSR, PACs - Personally Reviewed  ECG    NA - Personally Reviewed  Physical Exam   GEN: intubated, sedated.  Follows some commands Neck: No JVD Cardiac: RRR, no murmurs Respiratory: Bilateral rhonchi, ventilated GI: Soft, nontender, non-distended, normal bowel sounds  MS:  Trace edema; No deformity. Neuro:   Nonfocal  Psych: Oriented and appropriate    Labs    High Sensitivity Troponin:   Recent Labs  Lab 01/23/19 1343 01/23/19 1555 01/23/19 2034  TROPONINIHS _1 Chemistry Recent Labs  Lab 01/31/19 0433 02/01/19 0413 02/02/19 0202  NA 138 137 138  K 3.6 3.8 3.5  CL 100 99 100  CO2 _2 GLUCOSE 100* 92 54*  BUN 40* 36* 35*  CREATININE 1.92* 1.83* 1.77*  CALCIUM 8.7* 8.8* 9.2  GFRNONAA 27* 29* 30*  GFRAA 31* 33* 35*  ANIONGAP _3 Hematology Recent Labs  Lab 01/30/19 0420 02/01/19 0413  02/02/19 0202  WBC 5.6 4.6 5.8  RBC 3.28* 3.31* 3.51*  HGB 8.9* 9.1* 9.5*  HCT 30.1* 30.0* 31.7*  MCV 91.8 90.6 90.3  MCH 27.1 27.5 27.1  MCHC 29.6* 30.3 30.0  RDW 14.7 14.6 14.7  PLT 235 240 277    BNP No results for input(s): BNP, PROBNP in the last 168 hours.   DDimer No results for input(s): DDIMER in the last 168 hours.   Radiology    Dg Chest 1 View  Result Date: 02/01/2019 CLINICAL DATA:  Post intubation EXAM: CHEST  1 VIEW COMPARISON:  Numerous recent priors, most recently 02/01/2019 FINDINGS: Low positioning of the endotracheal tube within the right mainstem bronchus. Recommend retraction 4 cm to the mid trachea. Improving aeration through the left lung. Residual airspace opacity is present in the mid to lower lungs with central air bronchograms. Stable  prominence of the cardiac silhouette. No pneumothorax. No acute osseous or soft tissue abnormality. Cholecystectomy clips in the right upper quadrant. IMPRESSION: 1. Low positioning of the endotracheal tube within the right mainstem bronchus. Recommend retraction approximately 4 cm to the mid trachea. 2. Improving aeration through the left lung. Residual airspace opacity in the mid to lower lungs with central air bronchograms, concerning for a superimposed pneumonia These results will be called to the ordering clinician or representative by the Radiologist Assistant, and communication documented in the PACS or zVision Dashboard. Electronically Signed   By: Lovena Le M.D.   On: 02/01/2019 17:34   Dg Chest 2 View  Result Date: 02/01/2019 CLINICAL DATA:  Chest pain. EXAM: CHEST - 2 VIEW COMPARISON:  CT 01/31/2019. FINDINGS: Opacification of the left hemithorax again noted consistent with left lung atelectasis. Reference is made to prior CT report. Small right pleural effusion. No pneumothorax. Heart size cannot be evaluated. Degenerative change thoracic spine IMPRESSION: Opacification left hemithorax again noted consistent with left lung atelectasis again noted. No change from prior CT. Reference is made to prior CT report 01/31/2019. Electronically Signed   By: [place]   On: 02/01/2019 11:42   Ct Chest Wo Contrast  Result Date: 01/31/2019 CLINICAL DATA:  Shortness of breath. EXAM: CT CHEST WITHOUT CONTRAST TECHNIQUE: Multidetector CT imaging of the chest was performed following the standard protocol without IV contrast. COMPARISON:  Chest x-ray from yesterday. CTA chest dated November 27, 2017. FINDINGS: Cardiovascular: Unchanged mild cardiomegaly. No pericardial effusion. No thoracic aortic aneurysm. Coronary, aortic arch, and branch vessel atherosclerotic vascular disease. Mediastinum/Nodes: No pathologically enlarged mediastinal or axillary lymph nodes. Several prominent mediastinal lymph nodes  measuring up to 9 mm in short axis are likely reactive. The thyroid gland, trachea, and esophagus demonstrate no significant findings. Lungs/Pleura: Prominent secretions within the left mainstem bronchus and left upper and lower lobe bronchi with near complete collapse of the left upper lobe and complete collapse of the left lower lobe. The presence of air bronchograms in the left upper and lower lobes is suggestive of superimposed consolidation. Small to moderate bilateral pleural effusions. Dependent atelectasis in the right upper and lower lobes. Scattered mild interlobular septal thickening in the right lung. No pneumothorax. Upper Abdomen: No acute abnormality. Musculoskeletal: No chest wall mass or suspicious bone lesions identified. Mild anasarca. IMPRESSION: 1. Prominent secretions within the left mainstem and left upper and lower lobe bronchi with near complete collapse of the left upper lobe and complete collapse of the left lower lobe. The presence of air bronchograms in the left upper and lower lobes may reflect superimposed multifocal  pneumonia. 2. Small to moderate bilateral pleural effusions. 3. Mild interstitial pulmonary edema. 4. Aortic atherosclerosis (ICD10-I70.0). Electronically Signed   By: Titus Dubin M.D.   On: 01/31/2019 15:51    Cardiac Studies   ECHO 01/24/19 IMPRESSIONS    1. Left ventricular ejection fraction, by visual estimation, is 40 to 45%. The left ventricle has normal function. There is moderately increased left ventricular hypertrophy.  2. Global hypokinesis, worse apically.  3. Left ventricular diastolic Doppler parameters are consistent with restrictive filling pattern of LV diastolic filling.  4. Elevated left atrial and left ventricular end-diastolic pressures.  5. Global right ventricle has mildly reduced systolic function.The right ventricular size is normal. No increase in right ventricular wall thickness.  6. Left atrial size was normal.  7. Right  atrial size was normal.  8. Moderate pleural effusion in the left lateral region.  9. The mitral valve is abnormal. Mild mitral valve regurgitation. 10. The tricuspid valve is grossly normal. Tricuspid valve regurgitation is mild. 11. The aortic valve is tricuspid Aortic valve regurgitation is trivial by color flow Doppler. Mild aortic valve sclerosis without stenosis. 12. The pulmonic valve was grossly normal. Pulmonic valve regurgitation is trivial by color flow Doppler. 13. Mildly elevated pulmonary artery systolic pressure. 14. The inferior vena cava is normal in size with <50% respiratory variability, suggesting right atrial pressure of 8 mmHg.    Patient Profile     63 y.o. female with a history of normal coronary arteries on cath in 2018, possible stress-induced cardiomyopathy, hypertension, hyperlipidemia, and prior CVA who was admitted on 01/23/2019 for acute on chronic combine CHF after presenting with worsening dyspnea, orthopnea, PND, lower extremity and weight gain. Cardiology consulted for assistance. Patient has also been complaining of significant abdominal pain and nausea for which GI is now following.  Assessment & Plan    Acute on Chronic Combined CHF: net negative 1.2L yesterday, renal function continues to improve.  Would continue diuresis.  Hypertension: required clevidipine gtt overnight.  Can wean once able to take PO and can restart coreg and bidil  Hyperlipidemia: Continue statin.    Acute on Chronic Kidney Disease Stage III :   Improving with diuresis as above   For questions or updates, please contact [place] Please consult [email or web address] for contact info under        Signed, Donato Heinz, MD  02/02/2019, 7:58 AM

## 2019-02-02 NOTE — Progress Notes (Signed)
[facility] Progress Note  Subjective: UOP 1.6 L yest, , creat down 1.7  Vitals:   02/02/19 0333 02/02/19 0400 02/02/19 0500 02/02/19 0600  BP:  (!) 162/56 (!) 146/56 (!) 172/62  Pulse:  65 63 63  Resp:  16 16 16   Temp:      TempSrc:      SpO2: 100% 100% 100% 100%  Weight:   74.7 kg   Height:        Inpatient medications: . albuterol  2.5 mg Nebulization TID  . allopurinol  150 mg Oral Daily  . aspirin EC  81 mg Oral Daily  . atorvastatin  80 mg Oral q1800  . carvedilol  12.5 mg Oral BID WC  . chlorhexidine gluconate (MEDLINE KIT)  15 mL Mouth Rinse BID  . Chlorhexidine Gluconate Cloth  6 each Topical Daily  . dextromethorphan-guaiFENesin  1 tablet Oral BID  . enoxaparin (LOVENOX) injection  30 mg Subcutaneous Q24H  . furosemide  40 mg Intravenous Q12H  . gabapentin  300 mg Oral BID  . insulin aspart  0-15 Units Subcutaneous TID WC  . insulin aspart  0-5 Units Subcutaneous QHS  . insulin glargine  10 Units Subcutaneous QHS  . isosorbide-hydrALAZINE  1 tablet Oral TID  . mouth rinse  15 mL Mouth Rinse 10 times per day  . pantoprazole (PROTONIX) IV  40 mg Intravenous Q12H  . sodium chloride HYPERTONIC  4 mL Nebulization BID   . azithromycin 500 mg (02/01/19 1211)  . clevidipine 1 mg/hr (02/02/19 0303)  . dexmedetomidine (PRECEDEX) IV infusion Stopped (02/01/19 1716)  . propofol (DIPRIVAN) infusion 35 mcg/kg/min (02/02/19 0413)   albuterol, fentaNYL (SUBLIMAZE) injection, labetalol, ondansetron (ZOFRAN) IV, promethazine    Exam: Gen much more alert, no distress , nasal O2 Chest clear on R, L dec'd RRR no MRG Abd soft ntnd no mass or ascites +bs GU foley in place w/ amber urine , clear MS no joint effusions or deformity Ext soft doughy LE edema and UE edema 1-2 + Neuro is alert, Ox 3 , nf   CXR 10/28 > large L effusion, no pulm edema otherwise  Assessment/ Plan: 1. AKI on CKD III - baseline creat 1.1- 1.5.  AKI due to urinary retention +/- CHF  component. Resolving w/ foley in +/- diuresis.  Creat continues to improve, 1.7 today.  No further suggestions, will sign off.  2. Acute/chronic CHF - recurrent pleural effusion on L, EF 40-45%. Getting IV lasix 40 bid. Defer further diuresis to primary team.   3. HTN -  BP's up , on coreg/ Bidil per tube  , diuresing 4. Acute/ chronic resp failure - intubated on vent support per CCM 5. Abd pain - EGD showed hemorrhagic gastritis, no active bleeding. PPI Rx 6. DM2 7. H/ o CVA   Rob Ameia Morency 02/02/2019, 7:30 AM  Iron/TIBC/Ferritin/ %Sat    Component Value Date/Time   FERRITIN 77 10/31/2018 2056   Recent Labs  Lab 02/02/19 0202  NA 138  K 3.5  CL 100  CO2 25  GLUCOSE 54*  BUN 35*  CREATININE 1.77*  CALCIUM 9.2   No results for input(s): AST, ALT, ALKPHOS, BILITOT, PROT in the last 168 hours. Recent Labs  Lab 02/02/19 0202  WBC 5.8  HGB 9.5*  HCT 31.7*  PLT 277

## 2019-02-02 NOTE — Progress Notes (Signed)
NAME:  Kara Hanson, MRN:  [id number], DOB:  May 18, 1955, LOS: 64 ADMISSION DATE:  01/23/2019, CONSULTATION DATE:  01/31/19 REFERRING MD:  Dr. Wyonia Hough, CHIEF COMPLAINT:  Shortness of breath, central chest discomfort   Brief History   63 y/o F, prior COVID-19 in April, admitted to [facility] in June 2020 (discharged on 3L O2) who presented with SOB, LE swelling and chest pain.  Treated as decompensated CHF.  Thora on 10/28 consistent with transudate.    History of present illness   63 y/o F who presented to [facility] via EMS on 10/21 with reports of shortness of breath.  Patient reported on admit that she has had progressively worsening shortness of breath for 1 week.  Patient noted that it was worse with lying flat or with exertion.  She has been sleeping upright in a chair for the past week prior to admission.  Additionally she reported bilateral lower extremity swelling and the sensation that she felt very fluid overloaded.  She had brief chest discomfort the day prior to admission that did not feel similar to her prior MI.  She also reported abdominal pain / nausea on presentation.   The patient was documented as having 3+ pitting edema up to her knees on admit.  Initial work-up included chest x-ray which was consistent with pulmonary edema and small bilateral pleural effusion. Labs notable for serum creatinine 1.47 up from 1.56, COVID negative.  She was admitted per [facility] for evaluation of suspected decompensated heart failure.    Cardiology was consulted.  She was reported to have had an 8lb weight gain since previous admit (admit weight with variable documentation and I/O's not accurate).  CT of the head was negative. Abdominal CT negative for acute process.  HS troponin negative.  Given prior normal coronaries on LHC in 2018, decision was made not to repeat ischemic evaluation.  Nephrology evaluated the patient for AKI.  Cultures to date are negative. The patient was actively diuresed with weight down to  165 lbs.  On 10/28 she underwent a left thoracentesis with 270 ml of light yellow fluid removed.  Pleural fluid - protein <3, total nucleated cells 244.  She continued to have central chest pain, repeat CXR concerning for ongoing left sided airspace disease.  PCCM consulted for evaluation.    Past Medical History  Spinal stenosis CVA COVID-19 Positive April 9th, admitted to [facility] 6/22-6/30 , discharged on 3L HTN Hypercholesterolemia MI - normal coronary arteries on LHC 2018 CHF-LVH 40-45% on 10/22, global hypokinesis, worse apically, impaired diastolic filling  CKD DM 2 Chronic hypoxemic respiratory failure on 4 L O2 after COVID-19 Anemia Significant Hospital Events   10/21 Admit  10/29 PCCM consulted   Consults:  PCCM   Procedures:  10/30 bronch, intubation 10/31 extubation  Significant Diagnostic Tests:   ECHO 10/22 >> LVEF ~40-45%, moderate LVH, global hypokinesis worse apically.  Impaired LV diastolic filling, elevated left atrial and LVEDP, LA /RA normal size, moderate pleural effusion, mild MVR, mildly elevated pulmonary artery systolic pressure  CT ABD / Pelvis 10/22 >> no acute findings, moderate bilateral pleural effusions with dependent atelectasis, colonic diverticulosis without evidence of acute diverticulitis, small umbilical hernia containing omental fat  CT Head 10/26 >> negative for intracranial hemorrhage, chronic microvascular ischemic changes, right mastoid effusions  Thoracentesis 10/28 >> transudate, LDH <3  CT Chest w/o 10/29 >> prominent secretions in the left mainstem, LUL, LLL with near complete collapse of the LUL, presence of air bronchograms may reflect PNA, small  to moderate bilateral pleural effusions  Micro Data:  COVID 10/21 >> negative UC 10/26 >> suggest recollect Left Pleural Fluid 10/28 >>  Left Pleural Cytology 10/28 >> negative  Antimicrobials:  Azithro 10/29 >>  10/31 Rocephin 10/26 >> 10/30  Interim history/subjective:   No events Tolerated extubation this AM Bps improving now off vent  Objective   Blood pressure (!) 152/57, pulse 66, temperature 97.7 F (36.5 C), temperature source Oral, resp. rate 16, height 5' 5.51" (1.664 m), weight 74.7 kg, SpO2 100 %.    Vent Mode: PSV;CPAP FiO2 (%):  [40 %-100 %] 40 % Set Rate:  [16 bmp] 16 bmp Vt Set:  [460 mL] 460 mL PEEP:  [5 cmH20] 5 cmH20 Pressure Support:  [10 cmH20] 10 cmH20 Plateau Pressure:  [20 cmH20-26 cmH20] 20 cmH20   Intake/Output Summary (Last 24 hours) at 02/02/2019 0946 Last data filed at 02/02/2019 0740 Gross per 24 hour  Intake 531.38 ml  Output 1675 ml  Net -1143.62 ml   Filed Weights   01/31/19 0500 02/01/19 0500 02/02/19 0500  Weight: 74.9 kg 100.4 kg 74.7 kg    Examination: GEN: elderly woman in NAD HEENT: malampatti 2, MMM CV: RRR, ext warm PULM: decreased left base, okay on left upper lung field, no accessory muscle use GI: Soft, +BS EXT: No edema NEURO: Moves all 4 ext to command PSYCH: RASS -1 SKIN: No rashes  -1.L over 24h Renal function stable Procal neg  Resolved Hospital Problem list      Assessment & Plan:  # Mucus plugging of left lung due to weakness, poor pulmonary toileting since COVID infection. # Left effusion- cytology neg, transudative, would not worry about # Muscular deconditioning acquired from multiple hospitalizations # CKD, HF- does not really appear volume up at this time but is tolerating diureses so fine with me # HTN- worse related to intubation and sedation # Hypoglycemic this AM  - SLP eval pending - Hold lantus today, check SSI requirements - Up to chair - Resume home BP meds, wean cleviprex - Hypertonic saline, vest therapy - Diuretics etc per cards/renal - Should be okay to return to [facility] starting tomorrow, kept her on my service overnight  Erskine Emery MD

## 2019-02-02 NOTE — Progress Notes (Signed)
CPT held at this time. Patient sleeping and resting comfortably

## 2019-02-02 NOTE — Evaluation (Signed)
Clinical/Bedside Swallow Evaluation Patient Details  Name: Juel Ripley MRN: [id number] Date of Birth: 09-Sep-1955  Today's Date: 02/02/2019 Time: SLP Start Time (ACUTE ONLY): 3790 SLP Stop Time (ACUTE ONLY): 1750 SLP Time Calculation (min) (ACUTE ONLY): 27 min  Past Medical History:  Past Medical History:  Diagnosis Date  . CHF (congestive heart failure) ([place])   . CVA (cerebral vascular accident) ([place])   . Diabetes mellitus without complication ([place])   . Hypercholesteremia   . Hypertension   . Myocardial infarction ([place])   . Pneumonia 11/01/2018  . Spinal stenosis    Past Surgical History:  Past Surgical History:  Procedure Laterality Date  . BIOPSY  01/27/2019   Procedure: BIOPSY;  Surgeon: Otis Brace, MD;  Location: [facility] ENDOSCOPY;  Service: Gastroenterology;;  . BLADDER SURGERY    . CARDIAC CATHETERIZATION N/A 04/25/2016   Procedure: Left Heart Cath and Coronary Angiography;  Surgeon: Lorretta Harp, MD;  Location: [place] CV LAB;  Service: Cardiovascular;  Laterality: N/A;  . CESAREAN SECTION    . CHOLECYSTECTOMY    . ESOPHAGOGASTRODUODENOSCOPY (EGD) WITH PROPOFOL N/A 01/27/2019   Procedure: ESOPHAGOGASTRODUODENOSCOPY (EGD) WITH PROPOFOL;  Surgeon: Otis Brace, MD;  Location: [facility] ENDOSCOPY;  Service: Gastroenterology;  Laterality: N/A;   HPI:  63yo female admitted 01/23/2019 with SOB. PMH: CHF, CVA, DM, HTN, MI, PNA July 2020, ChestCT =Prominent secretions within the left mainstem and left upper andlower lobe bronchi with near complete collapse of the left upperlobe and complete collapse of the left lower lobe. The presence ofair bronchograms in the left upper and lower lobes may reflectsuperimposed multifocal pneumonia.   Assessment / Plan / Recommendation Clinical Impression  Pt presents with weak/dysphonic voice likely from intubation - Per RN ? possible tight intubation - therefore possibly with acute pharyngeal edema that may impact pharyngeal  clearance/UES opening.  She denies this occuring prior to admit.  No overt indication of aspiration *no overt coughing* during po and pt with timely swallow.  She uses caution with intake fortunately as she points to her esophagus to indicate area of discomfort while eating prior to hospitalization.  Did not conduct [facility] 3 ounce water test as she was tired and did not feel she could do it.  Pt did report sensing "backflow" from esophagus into throat  - with cued dry swallow = sensation cleared.  Recommend continue soft/thin diet as pt has only upper dentures.  Advised that if she becomes dyspneic to rest and if senses residuals backflowing - take liquids or swallow a few times to clear. Further advised if liquids clear better and senses backflow with solids to primarily consume liquids. SLP will follow up x to assure tolerance.  Provided pt with warm blanket and adjusted room temperature in room per her request. Thanks for this consult. SLP Visit Diagnosis: Dysphagia, unspecified (R13.10)(suspect pharyngeal)    Aspiration Risk       Diet Recommendation Dysphagia 3 (Mech soft);Thin liquid   Liquid Administration via: Straw Medication Administration: Whole meds with puree Supervision: Patient able to self feed Compensations: Slow rate;Small sips/bites(drink liquids t/o meal) Postural Changes: Seated upright at 90 degrees;Remain upright for at least 30 minutes after po intake    Other  Recommendations Oral Care Recommendations: Oral care BID;Other (Comment)(remove dentures nightly)   Follow up Recommendations (tbd)      Frequency and Duration min 1 x/week  1 week       Prognosis Prognosis for Safe Diet Advancement: Good      Swallow Study  General Date of Onset: 01/23/19 HPI: 63yo female admitted 01/23/2019 with SOB. PMH: CHF, CVA, DM, HTN, MI, PNA July 2020, ChestCT =Prominent secretions within the left mainstem and left upper andlower lobe bronchi with near complete collapse of the left  upperlobe and complete collapse of the left lower lobe. The presence ofair bronchograms in the left upper and lower lobes may reflectsuperimposed multifocal pneumonia. Type of Study: Bedside Swallow Evaluation Previous Swallow Assessment: none Diet Prior to this Study: Dysphagia 3 (soft);Thin liquids Temperature Spikes Noted: No History of Recent Intubation: Yes Length of Intubations (days): 1 days Date extubated: 02/02/19 Behavior/Cognition: Alert;Cooperative;Pleasant mood Oral Cavity Assessment: Within Functional Limits Oral Care Completed by SLP: No Oral Cavity - Dentition: Dentures, top(no lower dentures in the hospital) Self-Feeding Abilities: Able to feed self Patient Positioning: Upright in bed Baseline Vocal Quality: Hoarse(? some pharyngeal edema from intubation) Volitional Cough: Weak Volitional Swallow: Able to elicit    Oral/Motor/Sensory Function Overall Oral Motor/Sensory Function: Within functional limits   Ice Chips Ice chips: Not tested   Thin Liquid Thin Liquid: Within functional limits Presentation: Straw    Nectar Thick Nectar Thick Liquid: Not tested   Honey Thick Honey Thick Liquid: Not tested   Puree Puree: Within functional limits Presentation: Self Fed;Spoon   Solid     Solid: Within functional limits Presentation: Self Fredirick Lathe 02/02/2019,6:27 PM   Luanna Salk, [facility] SLP [place] Pager [phone] Office [phone]

## 2019-02-02 NOTE — Procedures (Signed)
Extubation Procedure Note  Patient Details:   Name: Kara Hanson DOB: 1956-03-07 MRN: [id number]   Airway Documentation:    Vent end date: (S) 02/02/19 Vent end time: (S) 0920   Evaluation  O2 sats: stable throughout Complications: No apparent complications Patient did tolerate procedure well. Bilateral Breath Sounds: Rhonchi   Yes whisper  Baldwin [place] Nannette 02/02/2019, 9:23 AM  Negative cuff leak pre extubation MD aware. Placed on 2 lpm post extubation.

## 2019-02-03 ENCOUNTER — Inpatient Hospital Stay (HOSPITAL_COMMUNITY): Payer: Medicare Other

## 2019-02-03 LAB — CBC WITH DIFFERENTIAL/PLATELET
Abs Immature Granulocytes: 0.03 10*3/uL (ref 0.00–0.07)
Basophils Absolute: 0 10*3/uL (ref 0.0–0.1)
Basophils Relative: 0 %
Eosinophils Absolute: 0.2 10*3/uL (ref 0.0–0.5)
Eosinophils Relative: 3 %
HCT: 30.3 % — ABNORMAL LOW (ref 36.0–46.0)
Hemoglobin: 8.8 g/dL — ABNORMAL LOW (ref 12.0–15.0)
Immature Granulocytes: 1 %
Lymphocytes Relative: 12 %
Lymphs Abs: 0.6 10*3/uL — ABNORMAL LOW (ref 0.7–4.0)
MCH: 26.8 pg (ref 26.0–34.0)
MCHC: 29 g/dL — ABNORMAL LOW (ref 30.0–36.0)
MCV: 92.4 fL (ref 80.0–100.0)
Monocytes Absolute: 0.5 10*3/uL (ref 0.1–1.0)
Monocytes Relative: 10 %
Neutro Abs: 3.9 10*3/uL (ref 1.7–7.7)
Neutrophils Relative %: 74 %
Platelets: 266 10*3/uL (ref 150–400)
RBC: 3.28 MIL/uL — ABNORMAL LOW (ref 3.87–5.11)
RDW: 14.7 % (ref 11.5–15.5)
WBC: 5.3 10*3/uL (ref 4.0–10.5)
nRBC: 0 % (ref 0.0–0.2)

## 2019-02-03 LAB — BASIC METABOLIC PANEL
Anion gap: 10 (ref 5–15)
BUN: 34 mg/dL — ABNORMAL HIGH (ref 8–23)
CO2: 29 mmol/L (ref 22–32)
Calcium: 8.8 mg/dL — ABNORMAL LOW (ref 8.9–10.3)
Chloride: 99 mmol/L (ref 98–111)
Creatinine, Ser: 1.69 mg/dL — ABNORMAL HIGH (ref 0.44–1.00)
GFR calc Af Amer: 37 mL/min — ABNORMAL LOW (ref >60)
GFR calc non Af Amer: 32 mL/min — ABNORMAL LOW (ref >60)
Glucose, Bld: 111 mg/dL — ABNORMAL HIGH (ref 70–99)
Potassium: 3.8 mmol/L (ref 3.5–5.1)
Sodium: 138 mmol/L (ref 135–145)

## 2019-02-03 LAB — GLUCOSE, CAPILLARY
Glucose-Capillary: 114 mg/dL — ABNORMAL HIGH (ref 70–99)
Glucose-Capillary: 170 mg/dL — ABNORMAL HIGH (ref 70–99)
Glucose-Capillary: 140 mg/dL — ABNORMAL HIGH (ref 70–99)

## 2019-02-03 LAB — MAGNESIUM: Magnesium: 1.8 mg/dL (ref 1.7–2.4)

## 2019-02-03 MED ORDER — ACETAMINOPHEN 325 MG PO TABS
650.0000 mg | ORAL_TABLET | Freq: Four times a day (QID) | ORAL | Status: DC | PRN
  Administered 2019-02-03 – 2019-02-05 (×2): 650 mg via ORAL
  Filled 2019-02-03 (×2): qty 2

## 2019-02-03 MED ORDER — MUSCLE RUB 10-15 % EX CREA
TOPICAL_CREAM | CUTANEOUS | Status: DC | PRN
  Filled 2019-02-03: qty 85

## 2019-02-03 NOTE — Progress Notes (Signed)
PROGRESS NOTE    Kara Hanson  ZOX:[id number] DOB: [birth year] DOA: 01/23/2019 PCP: [name], MD  Outpatient Specialists:   Brief Narrative: Patient is a 63 year old female with past medical history significant for diastolic versus combined systolic and diastolic congestive heart failure (left ventricular EF of 50% as per echo done on 11/01/2018), spinal stenosis, coronary artery disease, MI, chronic kidney disease stage III hypertension, hyperlipidemia, diabetes mellitus and CVA.  There is also documentation of recent Covid 19 infection necessitating inpatient care.  Patient was admitted with CHF exacerbation, acute kidney injury on CKD stage III, and left-sided pleural effusion noted to be transudative.  Patient was transferred to ICU team due to worsening respiratory symptoms.  Work-up by the ICU team revealed complete left lung atelectasis from endotracheal obstruction thought to be likely secondary to mucous plugging, status post intubation and extubation.  Pulmonary status has improved.  CHF symptoms have improved significantly.  Acute kidney injury, likely cardiorenal, has improved with diuretics.  Improved from critical care team, nephrology and cardiology appreciated.  Patient was transferred to the hospitalist service for further assessment and management.  This is my first contact with the patient.   Assessment & Plan:   Active Problems:   Hyperlipidemia LDL goal <70   Acute on chronic combined systolic and diastolic CHF (congestive heart failure) (HCC)   Controlled type 2 diabetes with neuropathy (HCC)   CHF (congestive heart failure) (HCC)   Epigastric pain   Acute renal failure superimposed on stage 3 chronic kidney disease (HCC)   Acute on chronic respiratory failure with hypoxia (HCC)   Atelectasis  Acute on chronic combined systolic and diastolic congestive heart failure: -Cardiology team is directing care. -Symptoms have improved significantly. -Continue  diuretics. -Echocardiogram revealed EF of 40 to 45% with restrictive filling program. -Continue to weigh patient daily. -Strict I's and O's. -Continue Coreg 12.5 Mg p.o. twice daily, IV Lasix 40 mg every 12, BiDil 20-37.5 tabs 1 tab p.o. 3 times daily -Further management will depend on hospital course.  Symptoms optimize.  Acute kidney injury on chronic kidney disease stage III: -AKI is likely cardiorenal. -AKI is improving. -Continue diuresis.  Left pleural effusion: -Transudate. -No further work-up.  Mucous plugging of left lung: -Continue pulmonary toiletry. -Likely secondary to weakness. -Recent Covid infection in April of this year.  Hypertension: Continue current medication. Goal blood pressures less than 130/80 mmHg.  Diabetes mellitus: Patient is currently on sliding scale insulin coverage. Episodes of hypoglycemia noted earlier this morning. Cautious use of insulin.  Lantus is on hold. Calorie count.  Hyperlipidemia: Continue Lipitor.  DVT prophylaxis: Subcutaneous Lovenox Code Status: Full code Family Communication:  Disposition Plan: Likely home eventually.  Consult physical therapy   Consultants:   Cardiology  Nephrology  Patient was under the ICU team  Procedures:   Echocardiogram  Intubation and extubation  Antimicrobials:   None   Subjective: No new complaints No shortness of breath No chest pain No fever or chills  Objective: Vitals:   02/03/19 0600 02/03/19 0700 02/03/19 0842 02/03/19 0938  BP: (!) 110/34 (!) 97/34  128/74  Pulse: 80 80  86  Resp: 17 18    Temp:      TempSrc:      SpO2: 98% 100% 97%   Weight:      Height:        Intake/Output Summary (Last 24 hours) at 02/03/2019 1017 Last data filed at 02/03/2019 0700 Gross per 24 hour  Intake 39.98 ml  Output 1200  ml  Net -1160.02 ml   Filed Weights   01/31/19 0500 02/01/19 0500 02/02/19 0500  Weight: 74.9 kg 100.4 kg 74.7 kg    Examination:  General exam:  Appears calm and comfortable. Respiratory system: Clear to auscultation, but slightly decreased air entry globally.  Cardiovascular system: S1 & S2 heard. Gastrointestinal system: Abdomen is nondistended, soft and nontender. No organomegaly or masses felt. Normal bowel sounds heard. Central nervous system: Alert and oriented.  Patient moves all extremities.   Extremities: Minimal ankle edema  Data Reviewed: I have personally reviewed following labs and imaging studies  CBC: Recent Labs  Lab 01/30/19 0420 02/01/19 0413 02/02/19 0202 02/03/19 0225  WBC 5.6 4.6 5.8 5.3  NEUTROABS  --  3.5 4.4 3.9  HGB 8.9* 9.1* 9.5* 8.8*  HCT 30.1* 30.0* 31.7* 30.3*  MCV 91.8 90.6 90.3 92.4  PLT 235 240 277 671   Basic Metabolic Panel: Recent Labs  Lab 01/30/19 0420 01/31/19 0433 02/01/19 0413 02/02/19 0202 02/03/19 0225  NA 137 138 137 138 138  K 3.7 3.6 3.8 3.5 3.8  CL 101 100 99 100 99  CO2 _0 GLUCOSE 85 100* 92 54* 111*  BUN 47* 40* 36* 35* 34*  CREATININE 2.20* 1.92* 1.83* 1.77* 1.69*  CALCIUM 8.8* 8.7* 8.8* 9.2 8.8*  MG  --   --   --   --  1.8   GFR: Estimated Creatinine Clearance: 34.4 mL/min (A) (by C-G formula based on SCr of 1.69 mg/dL (H)). Liver Function Tests: No results for input(s): AST, ALT, ALKPHOS, BILITOT, PROT, ALBUMIN in the last 168 hours. No results for input(s): LIPASE, AMYLASE in the last 168 hours. No results for input(s): AMMONIA in the last 168 hours. Coagulation Profile: No results for input(s): INR, PROTIME in the last 168 hours. Cardiac Enzymes: No results for input(s): CKTOTAL, CKMB, CKMBINDEX, TROPONINI in the last 168 hours. BNP (last 3 results) No results for input(s): PROBNP in the last 8760 hours. HbA1C: No results for input(s): HGBA1C in the last 72 hours. CBG: Recent Labs  Lab 02/02/19 0839 02/02/19 1201 02/02/19 1700 02/02/19 2122 02/03/19 0831  GLUCAP 54* 94 129* 158* 114*   Lipid Profile: Recent Labs     02/01/19 1717  TRIG 108   Thyroid Function Tests: No results for input(s): TSH, T4TOTAL, FREET4, T3FREE, THYROIDAB in the last 72 hours. Anemia Panel: No results for input(s): VITAMINB12, FOLATE, FERRITIN, TIBC, IRON, RETICCTPCT in the last 72 hours. Urine analysis:    Component Value Date/Time   COLORURINE YELLOW 01/28/2019 0714   APPEARANCEUR TURBID (A) 01/28/2019 0714   LABSPEC 1.015 01/28/2019 0714   PHURINE 5.0 01/28/2019 0714   GLUCOSEU 50 (A) 01/28/2019 0714   HGBUR NEGATIVE 01/28/2019 0714   BILIRUBINUR NEGATIVE 01/28/2019 0714   BILIRUBINUR negative 12/13/2017 [place] 01/28/2019 0714   PROTEINUR >=300 (A) 01/28/2019 0714   UROBILINOGEN 0.2 12/13/2017 1514   NITRITE NEGATIVE 01/28/2019 0714   LEUKOCYTESUR MODERATE (A) 01/28/2019 0714   Sepsis Labs: _1 (procalcitonin:4,lacticidven:4)  ) Recent Results (from the past 240 hour(s))  Culture, Urine     Status: Abnormal   Collection Time: 01/28/19  8:20 AM   Specimen: Urine, Random  Result Value Ref Range Status   Specimen Description   Final    URINE, RANDOM Performed at [facility], [place] [address] [place], [place] [address]    Special Requests   Final    NONE Performed at [facility], [place] [address] [place], [place] [address]    Culture MULTIPLE SPECIES PRESENT, SUGGEST RECOLLECTION (A)  Final   Report Status 01/29/2019 FINAL  Final  Culture, body fluid-bottle     Status: None (Preliminary result)   Collection Time: 01/30/19 12:38 PM   Specimen: Pleura  Result Value Ref Range Status   Specimen Description PLEURAL LEFT  Final   Special Requests NONE  Final   Culture   Final    NO GROWTH 4 DAYS Performed at [place] [address] [place], [place] [address]    Report Status PENDING  Incomplete  Gram stain     Status: None   Collection Time: 01/30/19 12:38 PM   Specimen: Pleura  Result Value Ref Range Status   Specimen  Description PLEURAL LEFT  Final   Special Requests NONE  Final   Gram Stain   Final    MODERATE WBC PRESENT, PREDOMINANTLY MONONUCLEAR NO ORGANISMS SEEN Performed at [facility], [place] [address] [place], [place] [address]    Report Status 01/30/2019 FINAL  Final  Culture, respiratory (non-expectorated)     Status: None (Preliminary result)   Collection Time: 02/01/19  3:43 PM   Specimen: Bronchoalveolar Lavage; Respiratory  Result Value Ref Range Status   Specimen Description   Final    BRONCHIAL ALVEOLAR LAVAGE Performed at [facility], [place] [address], [place], [place] [address]    Special Requests   Final    NONE Performed at [facility], [place] [address], [place], [place] [address]    Gram Stain   Final    ABUNDANT WBC PRESENT,BOTH PMN AND MONONUCLEAR RARE GRAM POSITIVE COCCI    Culture   Final    Consistent with normal respiratory flora. Performed at [facility], [place] [address] [place], [place] [address]    Report Status PENDING  Incomplete  MRSA PCR Screening     Status: None   Collection Time: 02/01/19  4:00 PM   Specimen: Nasal Mucosa; Nasopharyngeal  Result Value Ref Range Status   MRSA by PCR NEGATIVE NEGATIVE Final    Comment:        The GeneXpert MRSA Assay (FDA approved for NASAL specimens only), is one component of a comprehensive MRSA colonization surveillance program. It is not intended to diagnose MRSA infection nor to guide or monitor treatment for MRSA infections. Performed at [facility], [place] [address] [place], [place] [address]          Radiology Studies: Dg Chest 1 View  Result Date: 02/03/2019 CLINICAL DATA:  Follow-up possible pneumonia. EXAM: CHEST  1 VIEW COMPARISON:  02/01/2019 FINDINGS: The endotracheal tube has been removed. The cardiac silhouette remains mildly enlarged. Significantly improved aeration of the left lower lobe with residual airspace  opacity with air bronchograms. Mild diffuse accentuation of the interstitial markings with improvement. Minimal diffuse peribronchial thickening. Small left pleural effusion. Thoracic spine degenerative changes. IMPRESSION: 1. Significantly improved left lower lobe pneumonia/atelectasis. 2. Small left pleural effusion. 3. Minimal bronchitic changes. 4. Improved interstitial pulmonary edema with minimal residual interstitial pulmonary edema or chronic interstitial lung disease. 5. Mild cardiomegaly. Electronically Signed   By: Claudie Revering M.D.   On: 02/03/2019 08:10   Dg Chest 1 View  Result Date: 02/01/2019 CLINICAL DATA:  Post intubation EXAM: CHEST  1 VIEW COMPARISON:  Numerous recent priors, most recently 02/01/2019 FINDINGS: Low positioning of the endotracheal tube within the right  mainstem bronchus. Recommend retraction 4 cm to the mid trachea. Improving aeration through the left lung. Residual airspace opacity is present in the mid to lower lungs with central air bronchograms. Stable prominence of the cardiac silhouette. No pneumothorax. No acute osseous or soft tissue abnormality. Cholecystectomy clips in the right upper quadrant. IMPRESSION: 1. Low positioning of the endotracheal tube within the right mainstem bronchus. Recommend retraction approximately 4 cm to the mid trachea. 2. Improving aeration through the left lung. Residual airspace opacity in the mid to lower lungs with central air bronchograms, concerning for a superimposed pneumonia These results will be called to the ordering clinician or representative by the Radiologist Assistant, and communication documented in the PACS or zVision Dashboard. Electronically Signed   By: Lovena Le M.D.   On: 02/01/2019 17:34   Dg Chest 2 View  Result Date: 02/01/2019 CLINICAL DATA:  Chest pain. EXAM: CHEST - 2 VIEW COMPARISON:  CT 01/31/2019. FINDINGS: Opacification of the left hemithorax again noted consistent with left lung atelectasis. Reference  is made to prior CT report. Small right pleural effusion. No pneumothorax. Heart size cannot be evaluated. Degenerative change thoracic spine IMPRESSION: Opacification left hemithorax again noted consistent with left lung atelectasis again noted. No change from prior CT. Reference is made to prior CT report 01/31/2019. Electronically Signed   By: Marcello Moores  Register   On: 02/01/2019 11:42        Scheduled Meds:  albuterol  2.5 mg Nebulization TID   allopurinol  150 mg Oral Daily   aspirin EC  81 mg Oral Daily   atorvastatin  80 mg Oral q1800   carvedilol  12.5 mg Oral BID WC   chlorhexidine gluconate (MEDLINE KIT)  15 mL Mouth Rinse BID   Chlorhexidine Gluconate Cloth  6 each Topical Daily   DULoxetine  30 mg Oral Daily   enoxaparin (LOVENOX) injection  30 mg Subcutaneous Q24H   furosemide  40 mg Intravenous Q12H   gabapentin  100 mg Oral BID   insulin aspart  0-15 Units Subcutaneous TID WC   insulin aspart  0-5 Units Subcutaneous QHS   isosorbide-hydrALAZINE  1 tablet Oral TID   mouth rinse  15 mL Mouth Rinse 10 times per day   pantoprazole (PROTONIX) IV  40 mg Intravenous Q12H   Continuous Infusions:  dexmedetomidine (PRECEDEX) IV infusion Stopped (02/01/19 1716)     LOS: 11 days    Time spent: 35 Minutes.    Dana Allan, MD  [facility] Pager #: [phone] 7PM-7AM contact night coverage as above

## 2019-02-03 NOTE — Progress Notes (Signed)
Progress Note  Patient Name: Kara Hanson Date of Encounter: 02/03/2019  Primary Cardiologist: Candee Furbish, MD   Subjective   Extubated yesterday.  Net negative 1.1L yesterday on IV lasix 40 mg BID.  Renal function continues to improve (1.77 ->1.69).  Off clevidipine gtt, restarted coreg and bidil.  No longer hypertensive, BP soft this AM (97/34).  Reports dyspnea significantly improved, states that feels almost back to baseline.   Inpatient Medications    Scheduled Meds:  albuterol  2.5 mg Nebulization TID   allopurinol  150 mg Oral Daily   amLODipine  5 mg Oral Daily   aspirin EC  81 mg Oral Daily   atorvastatin  80 mg Oral q1800   carvedilol  12.5 mg Oral BID WC   chlorhexidine gluconate (MEDLINE KIT)  15 mL Mouth Rinse BID   Chlorhexidine Gluconate Cloth  6 each Topical Daily   DULoxetine  30 mg Oral Daily   enoxaparin (LOVENOX) injection  30 mg Subcutaneous Q24H   furosemide  40 mg Intravenous Q12H   gabapentin  100 mg Oral BID   insulin aspart  0-15 Units Subcutaneous TID WC   insulin aspart  0-5 Units Subcutaneous QHS   isosorbide-hydrALAZINE  1 tablet Oral TID   mouth rinse  15 mL Mouth Rinse 10 times per day   pantoprazole (PROTONIX) IV  40 mg Intravenous Q12H   sodium chloride HYPERTONIC  4 mL Nebulization BID   Continuous Infusions:  dexmedetomidine (PRECEDEX) IV infusion Stopped (02/01/19 1716)   PRN Meds: acetaminophen, albuterol, labetalol, Muscle Rub, ondansetron (ZOFRAN) IV, phenol, promethazine   Vital Signs    Vitals:   02/03/19 0400 02/03/19 0500 02/03/19 0600 02/03/19 0700  BP: (!) 138/54 (!) 141/45 (!) 110/34 (!) 97/34  Pulse: 81 84 80 80  Resp: _0 Temp: 98.8 F (37.1 C)     TempSrc: Oral     SpO2: (!) 88% 98% 98% 100%  Weight:      Height:        Intake/Output Summary (Last 24 hours) at 02/03/2019 0737 Last data filed at 02/03/2019 0700 Gross per 24 hour  Intake 136.91 ml  Output 1200 ml  Net -1063.09  ml   Last 3 Weights 02/02/2019 02/01/2019 01/31/2019  Weight (lbs) 164 lb 10.9 oz 221 lb 5.5 oz 165 lb 2 oz  Weight (kg) 74.7 kg 100.4 kg 74.9 kg      Telemetry    NSR in 80s, occasional PVCs- Personally Reviewed  ECG    NA - Personally Reviewed  Physical Exam   GEN: in no acute distress Neck: + JVD Cardiac: RRR, no murmurs Respiratory: Bilateral rhonchi, no crackles GI: Soft, nontender, non-distended MS:  No edema Neuro:   Nonfocal  Psych: Oriented and appropriate    Labs    High Sensitivity Troponin:   Recent Labs  Lab 01/23/19 1343 01/23/19 1555 01/23/19 2034  TROPONINIHS _1 Chemistry Recent Labs  Lab 02/01/19 0413 02/02/19 0202 02/03/19 0225  NA 137 138 138  K 3.8 3.5 3.8  CL 99 100 99  CO2 _2 GLUCOSE 92 54* 111*  BUN 36* 35* 34*  CREATININE 1.83* 1.77* 1.69*  CALCIUM 8.8* 9.2 8.8*  GFRNONAA 29* 30* 32*  GFRAA 33* 35* 37*  ANIONGAP _3 Hematology Recent Labs  Lab 02/01/19 0413 02/02/19 0202 02/03/19 0225  WBC 4.6 5.8 5.3  RBC 3.31* 3.51* 3.28*  HGB 9.1* 9.5* 8.8*  HCT 30.0* 31.7* 30.3*  MCV 90.6 90.3 92.4  MCH 27.5 27.1 26.8  MCHC 30.3 30.0 29.0*  RDW 14.6 14.7 14.7  PLT 240 277 266    BNP No results for input(s): BNP, PROBNP in the last 168 hours.   DDimer No results for input(s): DDIMER in the last 168 hours.   Radiology    Dg Chest 1 View  Result Date: 02/01/2019 CLINICAL DATA:  Post intubation EXAM: CHEST  1 VIEW COMPARISON:  Numerous recent priors, most recently 02/01/2019 FINDINGS: Low positioning of the endotracheal tube within the right mainstem bronchus. Recommend retraction 4 cm to the mid trachea. Improving aeration through the left lung. Residual airspace opacity is present in the mid to lower lungs with central air bronchograms. Stable prominence of the cardiac silhouette. No pneumothorax. No acute osseous or soft tissue abnormality. Cholecystectomy clips in the right upper quadrant.  IMPRESSION: 1. Low positioning of the endotracheal tube within the right mainstem bronchus. Recommend retraction approximately 4 cm to the mid trachea. 2. Improving aeration through the left lung. Residual airspace opacity in the mid to lower lungs with central air bronchograms, concerning for a superimposed pneumonia These results will be called to the ordering clinician or representative by the Radiologist Assistant, and communication documented in the PACS or zVision Dashboard. Electronically Signed   By: Lovena Le M.D.   On: 02/01/2019 17:34   Dg Chest 2 View  Result Date: 02/01/2019 CLINICAL DATA:  Chest pain. EXAM: CHEST - 2 VIEW COMPARISON:  CT 01/31/2019. FINDINGS: Opacification of the left hemithorax again noted consistent with left lung atelectasis. Reference is made to prior CT report. Small right pleural effusion. No pneumothorax. Heart size cannot be evaluated. Degenerative change thoracic spine IMPRESSION: Opacification left hemithorax again noted consistent with left lung atelectasis again noted. No change from prior CT. Reference is made to prior CT report 01/31/2019. Electronically Signed   By: [place]   On: 02/01/2019 11:42    Cardiac Studies   ECHO 01/24/19 IMPRESSIONS    1. Left ventricular ejection fraction, by visual estimation, is 40 to 45%. The left ventricle has normal function. There is moderately increased left ventricular hypertrophy.  2. Global hypokinesis, worse apically.  3. Left ventricular diastolic Doppler parameters are consistent with restrictive filling pattern of LV diastolic filling.  4. Elevated left atrial and left ventricular end-diastolic pressures.  5. Global right ventricle has mildly reduced systolic function.The right ventricular size is normal. No increase in right ventricular wall thickness.  6. Left atrial size was normal.  7. Right atrial size was normal.  8. Moderate pleural effusion in the left lateral region.  9. The mitral  valve is abnormal. Mild mitral valve regurgitation. 10. The tricuspid valve is grossly normal. Tricuspid valve regurgitation is mild. 11. The aortic valve is tricuspid Aortic valve regurgitation is trivial by color flow Doppler. Mild aortic valve sclerosis without stenosis. 12. The pulmonic valve was grossly normal. Pulmonic valve regurgitation is trivial by color flow Doppler. 13. Mildly elevated pulmonary artery systolic pressure. 14. The inferior vena cava is normal in size with <50% respiratory variability, suggesting right atrial pressure of 8 mmHg.    Patient Profile     63 y.o. female with a history of normal coronary arteries on cath in 2018, possible stress-induced cardiomyopathy, hypertension, hyperlipidemia, and prior CVA who was admitted on 01/23/2019 for acute on chronic combine CHF after presenting with worsening dyspnea, orthopnea, PND, lower extremity and weight gain.  Cardiology consulted for assistance. Patient has also been complaining of significant abdominal pain and nausea for which GI is now following.  Assessment & Plan    Acute on Chronic Combined CHF: net negative 1.1L yesterday, renal function continues to improve.  Continues to have JVD but otherwise appears euvolemic.  Would continue IV diuresis today, can likely transition to PO tomorrow  Hypertension: required clevidipine gtt, weaned off 10/31.  Soft BP this morning, would continue coreg and bidil as BP toelrates  Hyperlipidemia: Continue statin.    Acute on Chronic Kidney Disease Stage III :   Improving with diuresis as above   For questions or updates, please contact [place] Please consult [email or web address] for contact info under        Signed, Donato Heinz, MD  02/03/2019, 7:37 AM

## 2019-02-03 NOTE — Progress Notes (Signed)
Patient refused chest vest for CPT. RT was able to talk patient into doing CPT through the bed. Patient tolerated well.

## 2019-02-03 NOTE — Progress Notes (Signed)
Pulmonary f/u note Seen in f/u for resp failure  S: patient looks great!  Some raspy voice from intubation.  Breathing near baseline.  Continues to tolerate diuresis.  O: occasional rhonci bilaterally, ext warm, +JVD.   CXR now with minimal interstitial edema.  A: # Mucus plugging of left lung due to weakness, poor pulmonary toileting since COVID infection. # Left effusion- cytology neg, transudative, would not worry about # Muscular deconditioning acquired from multiple hospitalizations, biggest issue going forward # CKD, HF- does not really appear volume up at this time but is tolerating diureses so fine with me  P:  - SLP: dysphagia diet - Up to chair - Hypertonic saline, vest therapy - Diuretics etc per cards/renal - She's now off oxygen during day, would continue it at night and with activity - Will sign off, call me if any questions or concerns - I called and updated daughter  Erskine Emery MD PCCM

## 2019-02-04 ENCOUNTER — Encounter (HOSPITAL_COMMUNITY): Payer: Self-pay | Admitting: Internal Medicine

## 2019-02-04 LAB — BASIC METABOLIC PANEL
Anion gap: 10 (ref 5–15)
BUN: 36 mg/dL — ABNORMAL HIGH (ref 8–23)
CO2: 29 mmol/L (ref 22–32)
Calcium: 8.9 mg/dL (ref 8.9–10.3)
Chloride: 98 mmol/L (ref 98–111)
Creatinine, Ser: 1.98 mg/dL — ABNORMAL HIGH (ref 0.44–1.00)
GFR calc Af Amer: 30 mL/min — ABNORMAL LOW (ref >60)
GFR calc non Af Amer: 26 mL/min — ABNORMAL LOW (ref >60)
Glucose, Bld: 131 mg/dL — ABNORMAL HIGH (ref 70–99)
Potassium: 3.7 mmol/L (ref 3.5–5.1)
Sodium: 137 mmol/L (ref 135–145)

## 2019-02-04 LAB — RENAL FUNCTION PANEL
Albumin: 2.6 g/dL — ABNORMAL LOW (ref 3.5–5.0)
Anion gap: 10 (ref 5–15)
BUN: 36 mg/dL — ABNORMAL HIGH (ref 8–23)
CO2: 29 mmol/L (ref 22–32)
Calcium: 8.9 mg/dL (ref 8.9–10.3)
Chloride: 98 mmol/L (ref 98–111)
Creatinine, Ser: 1.93 mg/dL — ABNORMAL HIGH (ref 0.44–1.00)
GFR calc Af Amer: 31 mL/min — ABNORMAL LOW (ref >60)
GFR calc non Af Amer: 27 mL/min — ABNORMAL LOW (ref >60)
Glucose, Bld: 131 mg/dL — ABNORMAL HIGH (ref 70–99)
Phosphorus: 3.6 mg/dL (ref 2.5–4.6)
Potassium: 3.7 mmol/L (ref 3.5–5.1)
Sodium: 137 mmol/L (ref 135–145)

## 2019-02-04 LAB — CBC WITH DIFFERENTIAL/PLATELET
Abs Immature Granulocytes: 0.01 10*3/uL (ref 0.00–0.07)
Basophils Absolute: 0 10*3/uL (ref 0.0–0.1)
Basophils Relative: 0 %
Eosinophils Absolute: 0.2 10*3/uL (ref 0.0–0.5)
Eosinophils Relative: 4 %
HCT: 30.4 % — ABNORMAL LOW (ref 36.0–46.0)
Hemoglobin: 9 g/dL — ABNORMAL LOW (ref 12.0–15.0)
Immature Granulocytes: 0 %
Lymphocytes Relative: 15 %
Lymphs Abs: 0.7 10*3/uL (ref 0.7–4.0)
MCH: 27.2 pg (ref 26.0–34.0)
MCHC: 29.6 g/dL — ABNORMAL LOW (ref 30.0–36.0)
MCV: 91.8 fL (ref 80.0–100.0)
Monocytes Absolute: 0.6 10*3/uL (ref 0.1–1.0)
Monocytes Relative: 12 %
Neutro Abs: 3.3 10*3/uL (ref 1.7–7.7)
Neutrophils Relative %: 69 %
Platelets: 253 10*3/uL (ref 150–400)
RBC: 3.31 MIL/uL — ABNORMAL LOW (ref 3.87–5.11)
RDW: 14.7 % (ref 11.5–15.5)
WBC: 4.7 10*3/uL (ref 4.0–10.5)
nRBC: 0 % (ref 0.0–0.2)

## 2019-02-04 LAB — CULTURE, BODY FLUID W GRAM STAIN -BOTTLE: Culture: NO GROWTH

## 2019-02-04 LAB — GLUCOSE, CAPILLARY
Glucose-Capillary: 129 mg/dL — ABNORMAL HIGH (ref 70–99)
Glucose-Capillary: 196 mg/dL — ABNORMAL HIGH (ref 70–99)
Glucose-Capillary: 124 mg/dL — ABNORMAL HIGH (ref 70–99)
Glucose-Capillary: 133 mg/dL — ABNORMAL HIGH (ref 70–99)

## 2019-02-04 LAB — CULTURE, RESPIRATORY W GRAM STAIN: Culture: NORMAL

## 2019-02-04 LAB — MAGNESIUM: Magnesium: 1.8 mg/dL (ref 1.7–2.4)

## 2019-02-04 MED ORDER — FUROSEMIDE 10 MG/ML IJ SOLN: 80.0000 mg | Freq: Once | INTRAMUSCULAR | Status: DC

## 2019-02-04 MED ORDER — FUROSEMIDE 10 MG/ML IJ SOLN
40.0000 mg | Freq: Once | INTRAMUSCULAR | Status: AC
  Administered 2019-02-04: 40 mg via INTRAVENOUS
  Filled 2019-02-04: qty 4

## 2019-02-04 MED ORDER — CARVEDILOL 12.5 MG PO TABS
25.0000 mg | ORAL_TABLET | Freq: Two times a day (BID) | ORAL | Status: DC
  Administered 2019-02-04 – 2019-02-07 (×7): 25 mg via ORAL
  Filled 2019-02-04 (×5): qty 2
  Filled 2019-02-04: qty 8
  Filled 2019-02-04 (×4): qty 2

## 2019-02-04 MED ORDER — FUROSEMIDE 10 MG/ML IJ SOLN
80.0000 mg | Freq: Once | INTRAMUSCULAR | Status: AC
  Administered 2019-02-04: 80 mg via INTRAVENOUS
  Filled 2019-02-04: qty 8

## 2019-02-04 MED ORDER — ENSURE ENLIVE PO LIQD
237.0000 mL | Freq: Three times a day (TID) | ORAL | Status: DC
  Administered 2019-02-04 – 2019-02-07 (×5): 237 mL via ORAL

## 2019-02-04 MED ORDER — ORAL CARE MOUTH RINSE
15.0000 mL | Freq: Two times a day (BID) | OROMUCOSAL | Status: DC
  Administered 2019-02-04 – 2019-02-07 (×7): 15 mL via OROMUCOSAL

## 2019-02-04 MED ORDER — ALBUTEROL SULFATE (2.5 MG/3ML) 0.083% IN NEBU
2.5000 mg | INHALATION_SOLUTION | Freq: Two times a day (BID) | RESPIRATORY_TRACT | Status: DC
  Administered 2019-02-04 – 2019-02-06 (×5): 2.5 mg via RESPIRATORY_TRACT
  Filled 2019-02-04 (×5): qty 3

## 2019-02-04 MED ORDER — PRO-STAT SUGAR FREE PO LIQD
30.0000 mL | Freq: Two times a day (BID) | ORAL | Status: DC
  Administered 2019-02-04 – 2019-02-05 (×4): 30 mL via ORAL
  Filled 2019-02-04 (×6): qty 30

## 2019-02-04 NOTE — Progress Notes (Signed)
Physical Therapy Treatment Patient Details Name: Kara Hanson MRN: [id number] DOB: 1955/12/15 Today's Date: 02/04/2019    History of Present Illness Patient is a 89 female with history of congestive heart failure with action fraction 50% as per echo on 11/01/2018 who presented with shortness of breath, dyspnea on exertion, orthopnea.  Pt was COVID-19 4/9-4/22/20 and COVID-19 PNA 09/24/18. She is on 4 L of oxygen at home 24/7 for her chronic hypoxia.  She had bronchoscopy on 10/30, was intubated, and remained intubated until 10/31.  PT has received reorders.    PT Comments    Despite reintubation for a day, pt demonstrating good progress with PT.  She was able to increase gait distance and stability with stable O2 sats today.  Cont POC.    Follow Up Recommendations  Home health PT;Supervision/Assistance - 24 hour     Equipment Recommendations  None recommended by PT    Recommendations for Other Services       Precautions / Restrictions Precautions Precautions: Fall Precaution Comments: chronic 4L O2 Restrictions Weight Bearing Restrictions: No    Mobility  Bed Mobility Overal bed mobility: Needs Assistance Bed Mobility: Supine to Sit     Supine to sit: Min guard     General bed mobility comments: HOB elevated  Transfers Overall transfer level: Needs assistance Equipment used: Rolling walker (2 wheeled) Transfers: Sit to/from Stand Sit to Stand: Min guard         General transfer comment: cued for hand placement; performed sit to stand x 3  Ambulation/Gait Ambulation/Gait assistance: Min guard Gait Distance (Feet): 100 Feet Assistive device: Rolling walker (2 wheeled) Gait Pattern/deviations: Step-through pattern;Decreased stride length     General Gait Details: Pt was on 3 LPM O2 at arrival with sats 99%; normally wears 4 LPM O2 at home so placed on 4 LPM for ambulation; sats 99% with ambulation.  Back on 3 LPM in room sats 100%.; 1 rest break required, cued  to breath in nose   Stairs             Wheelchair Mobility    Modified Rankin (Stroke Patients Only)       Balance Overall balance assessment: Needs assistance Sitting-balance support: Bilateral upper extremity supported Sitting balance-Leahy Scale: Good     Standing balance support: Bilateral upper extremity supported Standing balance-Leahy Scale: Fair                              Cognition   Behavior During Therapy: [facility] for tasks assessed/performed Overall Cognitive Status: Within Functional Limits for tasks assessed                                 General Comments: Pt more alert and talkative than last PT visit on Thursday 10/29      Exercises      General Comments        Pertinent Vitals/Pain Pain Assessment: No/denies pain    Home Living                      Prior Function            PT Goals (current goals can now be found in the care plan section) Progress towards PT goals: Progressing toward goals    Frequency    Min 3X/week      PT Plan Current plan remains  appropriate    Co-evaluation              AM-PAC PT "6 Clicks" Mobility   Outcome Measure  Help needed turning from your back to your side while in a flat bed without using bedrails?: A Little Help needed moving from lying on your back to sitting on the side of a flat bed without using bedrails?: A Little Help needed moving to and from a bed to a chair (including a wheelchair)?: A Little Help needed standing up from a chair using your arms (e.g., wheelchair or bedside chair)?: A Little Help needed to walk in hospital room?: A Little Help needed climbing 3-5 steps with a railing? : A Lot 6 Click Score: 17    End of Session Equipment Utilized During Treatment: Gait belt;Oxygen Activity Tolerance: Patient tolerated treatment well Patient left: in chair;with call bell/phone within reach;with chair alarm set Nurse Communication: Mobility  status PT Visit Diagnosis: Muscle weakness (generalized) (M62.81);Difficulty in walking, not elsewhere classified (R26.2)     Time: 8421-0312 PT Time Calculation (min) (ACUTE ONLY): 30 min  Charges:  $Gait Training: 8-22 mins $Therapeutic Activity: 8-22 mins                     Maggie Font, PT Acute Rehab Services [phone]    [name] 02/04/2019, 11:26 AM

## 2019-02-04 NOTE — Progress Notes (Signed)
CPT held at this time. Patient is sleeping

## 2019-02-04 NOTE — Progress Notes (Signed)
Cardiology Progress Note  Patient ID: Aaron Boeh MRN: [id number] DOB: September 10, 1955 Date of Encounter: 02/04/2019  Primary Cardiologist: Candee Furbish, MD  Subjective  Still remains with significant volume.  Net -700 cc.  ROS:  All other ROS reviewed and negative. Pertinent positives noted in the HPI.     Inpatient Medications  Scheduled Meds: . albuterol  2.5 mg Nebulization BID  . allopurinol  150 mg Oral Daily  . aspirin EC  81 mg Oral Daily  . atorvastatin  80 mg Oral q1800  . carvedilol  12.5 mg Oral BID WC  . chlorhexidine gluconate (MEDLINE KIT)  15 mL Mouth Rinse BID  . Chlorhexidine Gluconate Cloth  6 each Topical Daily  . DULoxetine  30 mg Oral Daily  . enoxaparin (LOVENOX) injection  30 mg Subcutaneous Q24H  . feeding supplement (ENSURE ENLIVE)  237 mL Oral TID BM  . feeding supplement (PRO-STAT SUGAR FREE 64)  30 mL Oral BID  . furosemide  40 mg Intravenous Once  . furosemide  80 mg Intravenous Once  . gabapentin  100 mg Oral BID  . insulin aspart  0-15 Units Subcutaneous TID WC  . insulin aspart  0-5 Units Subcutaneous QHS  . isosorbide-hydrALAZINE  1 tablet Oral TID  . mouth rinse  15 mL Mouth Rinse BID  . pantoprazole (PROTONIX) IV  40 mg Intravenous Q12H   Continuous Infusions: . dexmedetomidine (PRECEDEX) IV infusion Stopped (02/01/19 1716)   PRN Meds: acetaminophen, albuterol, labetalol, Muscle Rub, ondansetron (ZOFRAN) IV, phenol, promethazine   Vital Signs   Vitals:   02/04/19 0825 02/04/19 0900 02/04/19 1000 02/04/19 1100  BP:  (!) 173/62 (!) 122/46 (!) 138/48  Pulse:  84 73 77  Resp:  _0 Temp:      TempSrc:      SpO2: 99% 100% 99% 99%  Weight:      Height:        Intake/Output Summary (Last 24 hours) at 02/04/2019 1225 Last data filed at 02/03/2019 1300 Gross per 24 hour  Intake -  Output 300 ml  Net -300 ml   Last 3 Weights 02/02/2019 02/01/2019 01/31/2019  Weight (lbs) 164 lb 10.9 oz 221 lb 5.5 oz 165 lb 2 oz  Weight (kg)  74.7 kg 100.4 kg 74.9 kg      Telemetry  Overnight telemetry shows normal sinus rhythm with heart rate in 70 range, which I personally reviewed.   Physical Exam   Vitals:   02/04/19 0825 02/04/19 0900 02/04/19 1000 02/04/19 1100  BP:  (!) 173/62 (!) 122/46 (!) 138/48  Pulse:  84 73 77  Resp:  _1 Temp:      TempSrc:      SpO2: 99% 100% 99% 99%  Weight:      Height:         Intake/Output Summary (Last 24 hours) at 02/04/2019 1225 Last data filed at 02/03/2019 1300 Gross per 24 hour  Intake -  Output 300 ml  Net -300 ml    Last 3 Weights 02/02/2019 02/01/2019 01/31/2019  Weight (lbs) 164 lb 10.9 oz 221 lb 5.5 oz 165 lb 2 oz  Weight (kg) 74.7 kg 100.4 kg 74.9 kg    Body mass index is 26.98 kg/m.  General: Well nourished, well developed, in no acute distress Head: Atraumatic, normal size  Eyes: PEERLA, EOMI  Neck: Supple, no JVD Endocrine: No thryomegaly Cardiac: Normal S1, S2; RRR; no murmurs, rubs, or gallops Lungs: Crackles noted at  lung bases Abd: Soft, nontender, no hepatomegaly  Ext: Trace edema edema, pulses 2+ Musculoskeletal: No deformities, BUE and BLE strength normal and equal Skin: Warm and dry, no rashes   Neuro: Alert and oriented to person, place, time, and situation, CNII-XII grossly intact, no focal deficits  Psych: Normal mood and affect   Labs  High Sensitivity Troponin:   Recent Labs  Lab 01/23/19 1343 01/23/19 1555 01/23/19 2034  TROPONINIHS _0 Cardiac EnzymesNo results for input(s): TROPONINI in the last 168 hours. No results for input(s): TROPIPOC in the last 168 hours.  Chemistry Recent Labs  Lab 02/02/19 0202 02/03/19 0225 02/04/19 0251  NA 138 138 137  137  K 3.5 3.8 3.7  3.7  CL 100 99 98  98  CO2 _1 GLUCOSE 54* 111* 131*  131*  BUN 35* 34* 36*  36*  CREATININE 1.77* 1.69* 1.93*  1.98*  CALCIUM 9.2 8.8* 8.9  8.9  ALBUMIN  --   --  2.6*  GFRNONAA 30* 32* 27*  26*  GFRAA 35* 37* 31*  30*   ANIONGAP _2 Hematology Recent Labs  Lab 02/02/19 0202 02/03/19 0225 02/04/19 0251  WBC 5.8 5.3 4.7  RBC 3.51* 3.28* 3.31*  HGB 9.5* 8.8* 9.0*  HCT 31.7* 30.3* 30.4*  MCV 90.3 92.4 91.8  MCH 27.1 26.8 27.2  MCHC 30.0 29.0* 29.6*  RDW 14.7 14.7 14.7  PLT 277 266 253   BNPNo results for input(s): BNP, PROBNP in the last 168 hours.  DDimer No results for input(s): DDIMER in the last 168 hours.   Radiology  Dg Chest 1 View  Result Date: 02/03/2019 CLINICAL DATA:  Follow-up possible pneumonia. EXAM: CHEST  1 VIEW COMPARISON:  02/01/2019 FINDINGS: The endotracheal tube has been removed. The cardiac silhouette remains mildly enlarged. Significantly improved aeration of the left lower lobe with residual airspace opacity with air bronchograms. Mild diffuse accentuation of the interstitial markings with improvement. Minimal diffuse peribronchial thickening. Small left pleural effusion. Thoracic spine degenerative changes. IMPRESSION: 1. Significantly improved left lower lobe pneumonia/atelectasis. 2. Small left pleural effusion. 3. Minimal bronchitic changes. 4. Improved interstitial pulmonary edema with minimal residual interstitial pulmonary edema or chronic interstitial lung disease. 5. Mild cardiomegaly. Electronically Signed   By: Claudie Revering M.D.   On: 02/03/2019 08:10    Cardiac Studies  TTE 01/24/2019  1. Left ventricular ejection fraction, by visual estimation, is 40 to 45%. The left ventricle has normal function. There is moderately increased left ventricular hypertrophy.  2. Global hypokinesis, worse apically.  3. Left ventricular diastolic Doppler parameters are consistent with restrictive filling pattern of LV diastolic filling.  4. Elevated left atrial and left ventricular end-diastolic pressures.  5. Global right ventricle has mildly reduced systolic function.The right ventricular size is normal. No increase in right ventricular wall thickness.  6. Left atrial  size was normal.  7. Right atrial size was normal.  8. Moderate pleural effusion in the left lateral region.  9. The mitral valve is abnormal. Mild mitral valve regurgitation. 10. The tricuspid valve is grossly normal. Tricuspid valve regurgitation is mild. 11. The aortic valve is tricuspid Aortic valve regurgitation is trivial by color flow Doppler. Mild aortic valve sclerosis without stenosis. 12. The pulmonic valve was grossly normal. Pulmonic valve regurgitation is trivial by color flow Doppler. 13. Mildly elevated pulmonary artery systolic pressure. 14. The inferior vena cava is normal in  size with <50% respiratory variability, suggesting right atrial pressure of 8 mmHg.  Patient Profile  Kristyana Notte is a 63 y.o. female with history of normal coronary arteries, Takotsubo cardiomyopathy, hypertension, hyperlipidemia who was admitted on 10/21 for acute decompensated systolic heart failure.  Assessment & Plan   1.  Acute systolic heart failure, ejection fraction 40 to 45% -we will give her 80 mg of IV Lasix twice today -She still has significant crackles on examination and fluid to give -I think with good urine output we can see how her kidney function does tomorrow and likely transition to p.o. tomorrow -We will continue her home carvedilol and have increased her to 25 mg twice daily -We will continue BiDil 20-30 7.5 3 times daily for now -Once her kidney function improves will transition her to ACE ARB or Arni    For questions or updates, please contact [place] Please consult [email or web address] for contact info under        Signed, [place] T. Audie Box, [employer]  02/04/2019 12:25 PM

## 2019-02-04 NOTE — Progress Notes (Signed)
PROGRESS NOTE    Kara Hanson  GSU:[id number] DOB: 1955-10-12 DOA: 01/23/2019 PCP: [name], MD  Outpatient Specialists:   Brief Narrative: Patient is a 63 year old female with past medical history significant for diastolic versus combined systolic and diastolic congestive heart failure (left ventricular EF of 50% as per echo done on 11/01/2018), spinal stenosis, coronary artery disease, MI, chronic kidney disease stage III hypertension, hyperlipidemia, diabetes mellitus and CVA.  There is also documentation of recent Covid 19 infection necessitating inpatient care.  Patient was admitted with CHF exacerbation, acute kidney injury on CKD stage III, and left-sided pleural effusion noted to be transudative.  Patient was transferred to ICU team due to worsening respiratory symptoms.  Work-up by the ICU team revealed complete left lung atelectasis from endotracheal obstruction thought to be likely secondary to mucous plugging, status post intubation and extubation.  Pulmonary status has improved.  CHF symptoms have improved significantly.  Acute kidney injury, likely cardiorenal, has improved with diuretics.  Improved from critical care team, nephrology and cardiology appreciated.  Patient was transferred to the hospitalist service for further assessment and management.  02/04/2019: Patient seen.  Cardiology team is directing diuretics management.  IV Lasix 80 mg twice daily for today recommended by cardiology.   As per cardiology note, diuretics will be likely transition to oral in a.m.  Patient has remained stable.  No shortness of breath reported.  Assessment & Plan:   Active Problems:   Hyperlipidemia LDL goal <70   Acute on chronic combined systolic and diastolic CHF (congestive heart failure) (HCC)   Controlled type 2 diabetes with neuropathy (HCC)   CHF (congestive heart failure) (HCC)   Epigastric pain   Acute renal failure superimposed on stage 3 chronic kidney disease (HCC)   Acute  on chronic respiratory failure with hypoxia (HCC)   Atelectasis  Acute on chronic combined systolic and diastolic congestive heart failure: -Cardiology team is directing care. -Symptoms have improved significantly. -Continue diuretics. -Echocardiogram revealed EF of 40 to 45% with restrictive filling program. -Continue to weigh patient daily. -Strict I's and O's. -Continue Coreg 12.5 Mg p.o. twice daily, BiDil 20-37.5 tabs 1 tab p.o. 3 times daily -IV Lasix 80 mg twice daily x2 doses recommended by cardiology team for today (02/04/2019) -Further management will depend on hospital course.    Acute kidney injury on chronic kidney disease stage III: -AKI is likely cardiorenal. -AKI is improving. -Serum creatinine is 1.93 today (02/04/2019) -Continue diuresis as per cardiology team.  Left pleural effusion: -Transudate. -No further work-up.  Mucous plugging of left lung: -Continue pulmonary toiletry. -Likely secondary to weakness. -Recent Covid infection in April of this year.  Hypertension: Continue current medication. Goal blood pressures less than 130/80 mmHg. Last blood pressure documented is 134/44 mmHg.  Diabetes mellitus: Patient is currently on sliding scale insulin coverage. Lantus is on hold. Continue calorie count.  Hyperlipidemia: Continue Lipitor.  DVT prophylaxis: Subcutaneous Lovenox Code Status: Full code Family Communication:  Disposition Plan: Likely home eventually.  Consult physical therapy   Consultants:   Cardiology  Nephrology  Patient was under the ICU team  Procedures:   Echocardiogram  Intubation and extubation  Antimicrobials:   None   Subjective: No new complaints No shortness of breath No chest pain No fever or chills  Objective: Vitals:   02/04/19 0900 02/04/19 1000 02/04/19 1100 02/04/19 1200  BP: (!) 173/62 (!) 122/46 (!) 138/48 (!) 134/44  Pulse: 84 73 77 72  Resp: _0 Temp:  98.5 F (36.9 C)   TempSrc:    Oral  SpO2: 100% 99% 99% 96%  Weight:      Height:       No intake or output data in the 24 hours ending 02/04/19 1345 Filed Weights   01/31/19 0500 02/01/19 0500 02/02/19 0500  Weight: 74.9 kg 100.4 kg 74.7 kg    Examination:  General exam: Appears calm and comfortable. Respiratory system: Decreased air entry globally. Cardiovascular system: S1 & S2 heard. Gastrointestinal system: Abdomen is nondistended, soft and nontender. No organomegaly or masses felt. Normal bowel sounds heard. Central nervous system: Alert and oriented.  Patient moves all extremities.   Extremities: Minimal ankle edema  Data Reviewed: I have personally reviewed following labs and imaging studies  CBC: Recent Labs  Lab 01/30/19 0420 02/01/19 0413 02/02/19 0202 02/03/19 0225 02/04/19 0251  WBC 5.6 4.6 5.8 5.3 4.7  NEUTROABS  --  3.5 4.4 3.9 3.3  HGB 8.9* 9.1* 9.5* 8.8* 9.0*  HCT 30.1* 30.0* 31.7* 30.3* 30.4*  MCV 91.8 90.6 90.3 92.4 91.8  PLT 235 240 277 266 063   Basic Metabolic Panel: Recent Labs  Lab 01/31/19 0433 02/01/19 0413 02/02/19 0202 02/03/19 0225 02/04/19 0251  NA 138 137 138 138 137  137  K 3.6 3.8 3.5 3.8 3.7  3.7  CL 100 99 100 99 98  98  CO2 _0 GLUCOSE 100* 92 54* 111* 131*  131*  BUN 40* 36* 35* 34* 36*  36*  CREATININE 1.92* 1.83* 1.77* 1.69* 1.93*  1.98*  CALCIUM 8.7* 8.8* 9.2 8.8* 8.9  8.9  MG  --   --   --  1.8 1.8  PHOS  --   --   --   --  3.6   GFR: Estimated Creatinine Clearance: 29.4 mL/min (A) (by C-G formula based on SCr of 1.98 mg/dL (H)). Liver Function Tests: Recent Labs  Lab 02/04/19 0251  ALBUMIN 2.6*   No results for input(s): LIPASE, AMYLASE in the last 168 hours. No results for input(s): AMMONIA in the last 168 hours. Coagulation Profile: No results for input(s): INR, PROTIME in the last 168 hours. Cardiac Enzymes: No results for input(s): CKTOTAL, CKMB, CKMBINDEX, TROPONINI in the last 168 hours. BNP  (last 3 results) No results for input(s): PROBNP in the last 8760 hours. HbA1C: No results for input(s): HGBA1C in the last 72 hours. CBG: Recent Labs  Lab 02/03/19 0831 02/03/19 1639 02/03/19 2248 02/04/19 0824 02/04/19 1136  GLUCAP 114* 170* 140* 129* 196*   Lipid Profile: Recent Labs    02/01/19 1717  TRIG 108   Thyroid Function Tests: No results for input(s): TSH, T4TOTAL, FREET4, T3FREE, THYROIDAB in the last 72 hours. Anemia Panel: No results for input(s): VITAMINB12, FOLATE, FERRITIN, TIBC, IRON, RETICCTPCT in the last 72 hours. Urine analysis:    Component Value Date/Time   COLORURINE YELLOW 01/28/2019 0714   APPEARANCEUR TURBID (A) 01/28/2019 0714   LABSPEC 1.015 01/28/2019 0714   PHURINE 5.0 01/28/2019 0714   GLUCOSEU 50 (A) 01/28/2019 0714   HGBUR NEGATIVE 01/28/2019 0714   BILIRUBINUR NEGATIVE 01/28/2019 0714   BILIRUBINUR negative 12/13/2017 [place] 01/28/2019 0714   PROTEINUR >=300 (A) 01/28/2019 0714   UROBILINOGEN 0.2 12/13/2017 1514   NITRITE NEGATIVE 01/28/2019 0714   LEUKOCYTESUR MODERATE (A) 01/28/2019 0714   Sepsis Labs: _1 (procalcitonin:4,lacticidven:4)  ) Recent Results (from the past 240 hour(s))  Culture, Urine  Status: Abnormal   Collection Time: 01/28/19  8:20 AM   Specimen: Urine, Random  Result Value Ref Range Status   Specimen Description   Final    URINE, RANDOM Performed at [facility] [address] [place], [place] [address]    Special Requests   Final    NONE Performed at [facility], [place] [address] [place], [place] [address]    Culture MULTIPLE SPECIES PRESENT, SUGGEST RECOLLECTION (A)  Final   Report Status 01/29/2019 FINAL  Final  Culture, body fluid-bottle     Status: None   Collection Time: 01/30/19 12:38 PM   Specimen: Pleura  Result Value Ref Range Status   Specimen Description PLEURAL LEFT  Final   Special Requests NONE  Final   Culture    Final    NO GROWTH 5 DAYS Performed at [facility], [address], [place], [place] [address]    Report Status 02/04/2019 FINAL  Final  Gram stain     Status: None   Collection Time: 01/30/19 12:38 PM   Specimen: Pleura  Result Value Ref Range Status   Specimen Description PLEURAL LEFT  Final   Special Requests NONE  Final   Gram Stain   Final    MODERATE WBC PRESENT, PREDOMINANTLY MONONUCLEAR NO ORGANISMS SEEN Performed at [facility], [place] [address], [place], [place] [address]    Report Status 01/30/2019 FINAL  Final  Culture, respiratory (non-expectorated)     Status: None   Collection Time: 02/01/19  3:43 PM   Specimen: Bronchoalveolar Lavage; Respiratory  Result Value Ref Range Status   Specimen Description   Final    BRONCHIAL ALVEOLAR LAVAGE Performed at [facility], [place] [address] [place], [place] [address]    Special Requests   Final    NONE Performed at [facility], [place] [address], [place], [place] [address]    Gram Stain   Final    ABUNDANT WBC PRESENT,BOTH PMN AND MONONUCLEAR RARE GRAM POSITIVE COCCI    Culture   Final    Consistent with normal respiratory flora. Performed at [facility], [place] [address], [place], [place] [address]    Report Status 02/04/2019 FINAL  Final  MRSA PCR Screening     Status: None   Collection Time: 02/01/19  4:00 PM   Specimen: Nasal Mucosa; Nasopharyngeal  Result Value Ref Range Status   MRSA by PCR NEGATIVE NEGATIVE Final    Comment:        The GeneXpert MRSA Assay (FDA approved for NASAL specimens only), is one component of a comprehensive MRSA colonization surveillance program. It is not intended to diagnose MRSA infection nor to guide or monitor treatment for MRSA infections. Performed at [facility], [place] [address], [place], [place] [address]          Radiology Studies: Dg Chest 1 View  Result Date: 02/03/2019 CLINICAL  DATA:  Follow-up possible pneumonia. EXAM: CHEST  1 VIEW COMPARISON:  02/01/2019 FINDINGS: The endotracheal tube has been removed. The cardiac silhouette remains mildly enlarged. Significantly improved aeration of the left lower lobe with residual airspace opacity with air bronchograms. Mild diffuse accentuation of the interstitial markings with improvement. Minimal diffuse peribronchial thickening. Small left pleural effusion. Thoracic spine degenerative changes. IMPRESSION: 1. Significantly improved left lower lobe pneumonia/atelectasis. 2. Small left pleural effusion. 3. Minimal bronchitic changes. 4. Improved interstitial pulmonary edema with minimal residual interstitial pulmonary edema  or chronic interstitial lung disease. 5. Mild cardiomegaly. Electronically Signed   By: Claudie Revering M.D.   On: 02/03/2019 08:10        Scheduled Meds: . albuterol  2.5 mg Nebulization BID  . allopurinol  150 mg Oral Daily  . aspirin EC  81 mg Oral Daily  . atorvastatin  80 mg Oral q1800  . carvedilol  25 mg Oral BID WC  . chlorhexidine gluconate (MEDLINE KIT)  15 mL Mouth Rinse BID  . Chlorhexidine Gluconate Cloth  6 each Topical Daily  . DULoxetine  30 mg Oral Daily  . enoxaparin (LOVENOX) injection  30 mg Subcutaneous Q24H  . feeding supplement (ENSURE ENLIVE)  237 mL Oral TID BM  . feeding supplement (PRO-STAT SUGAR FREE 64)  30 mL Oral BID  . furosemide  40 mg Intravenous Once  . furosemide  80 mg Intravenous Once  . gabapentin  100 mg Oral BID  . insulin aspart  0-15 Units Subcutaneous TID WC  . insulin aspart  0-5 Units Subcutaneous QHS  . isosorbide-hydrALAZINE  1 tablet Oral TID  . mouth rinse  15 mL Mouth Rinse BID  . pantoprazole (PROTONIX) IV  40 mg Intravenous Q12H   Continuous Infusions: . dexmedetomidine (PRECEDEX) IV infusion Stopped (02/01/19 1716)     LOS: 12 days    Time spent: 25 Minutes.    Dana Allan, MD  [facility] Pager #: [phone] 7PM-7AM  contact night coverage as above

## 2019-02-04 NOTE — Progress Notes (Signed)
Initial Nutrition Assessment  RD working remotely.  DOCUMENTATION CODES:   Not applicable  INTERVENTION:   - Noted 48-hour Calorie Count ordered by MD on 11/01. Spoke with RN who will hang envelope on pt's door and start Calorie Count today at lunch meal. RD to follow up tomorrow, 11/03, with the results of Day 1.  - Ensure Enlive po TID, each supplement provides 350 kcal and 20 grams of protein  - Pro-stat 30 ml BID, each supplement provides 100 kcal and 15 grams of protein  NUTRITION DIAGNOSIS:   Increased nutrient needs related to chronic illness (CHF) as evidenced by estimated needs.  GOAL:   Patient will meet greater than or equal to 90% of their needs  MONITOR:   PO intake, Supplement acceptance, Labs, Weight trends, I & O's  REASON FOR ASSESSMENT:   Consult Calorie Count  ASSESSMENT:   63 year old female who presented to the ED on 10/21 with SOB. PMH of CHF, chronic respiratory failure on home oxygen, CAD, prior CVA, T2DM, HTN, HLD, anemia, CKD stage III, COVID admission in April. Pt admitted with acute on chronic CHF exacerbation, AKI.   10/25 - s/p EGD showing hemorrhagic gastritis without active bleeding 10/28 - s/p thoracentesis with 270 ml fluid removed 10/30 - intubated, s/p diagnostic and therapeutic bronch 10/31 - extubated, SLP recommending Dysphagia 3 diet with thin liquids  Calorie count ordered 11/01. Spoke with RN via phone call who reports no calorie count envelope on door. RN to hang envelope and start calorie count today with lunch meal. RD will follow up tomorrow, 11/03, with the results of Day 1.  Reviewed weights since admission. Weight up 4 lbs since first measured weight. Reviewed weight history in chart. Weight trending up over the last 6 months.  Per Rn edema assessment, pt with moderate pitting generalized edema and mild pitting edema to BUE and BLE.  Unable to reach pt via phone call to room. Will attempt to obtain diet and weight  history upon follow-up.  RD will order oral nutrition supplements to aid pt in meeting kcal and protein needs given variable PO intake.  Meal Completion: 0-100% x last 8 documented meals  Medications reviewed and include: Lasix, SSI, Protonix  Labs reviewed: BUN 36, creatinine 1.93, hemoglobin 9.0 CBG's: 129-170 x 24 hours  NUTRITION - FOCUSED PHYSICAL EXAM:  Unable to complete at this time. RD working remotely.  Diet Order:   Diet Order            DIET SOFT Room service appropriate? Yes; Fluid consistency: Thin  Diet effective now              EDUCATION NEEDS:   No education needs have been identified at this time  Skin:  Skin Assessment: Reviewed RN Assessment  Last BM:  02/01/19  Height:   Ht Readings from Last 1 Encounters:  02/01/19 5' 5.51" (1.664 m)    Weight:   Wt Readings from Last 1 Encounters:  02/02/19 74.7 kg    Ideal Body Weight:  58 kg  BMI:  Body mass index is 26.98 kg/m.  Estimated Nutritional Needs:   Kcal:  7628-3151  Protein:  85-100 grams  Fluid:  >/= 1.8 L    Gaynell Face, MS, RD, LDN Inpatient Clinical Dietitian Pager: [phone] Weekend/After Hours: [phone]

## 2019-02-05 LAB — CBC WITH DIFFERENTIAL/PLATELET
Abs Immature Granulocytes: 0.01 10*3/uL (ref 0.00–0.07)
Basophils Absolute: 0 10*3/uL (ref 0.0–0.1)
Basophils Relative: 0 %
Eosinophils Absolute: 0.2 10*3/uL (ref 0.0–0.5)
Eosinophils Relative: 3 %
HCT: 30.8 % — ABNORMAL LOW (ref 36.0–46.0)
Hemoglobin: 9 g/dL — ABNORMAL LOW (ref 12.0–15.0)
Immature Granulocytes: 0 %
Lymphocytes Relative: 14 %
Lymphs Abs: 0.6 10*3/uL — ABNORMAL LOW (ref 0.7–4.0)
MCH: 27 pg (ref 26.0–34.0)
MCHC: 29.2 g/dL — ABNORMAL LOW (ref 30.0–36.0)
MCV: 92.5 fL (ref 80.0–100.0)
Monocytes Absolute: 0.5 10*3/uL (ref 0.1–1.0)
Monocytes Relative: 12 %
Neutro Abs: 3 10*3/uL (ref 1.7–7.7)
Neutrophils Relative %: 71 %
Platelets: 259 10*3/uL (ref 150–400)
RBC: 3.33 MIL/uL — ABNORMAL LOW (ref 3.87–5.11)
RDW: 14.7 % (ref 11.5–15.5)
WBC: 4.4 10*3/uL (ref 4.0–10.5)
nRBC: 0 % (ref 0.0–0.2)

## 2019-02-05 LAB — BASIC METABOLIC PANEL
Anion gap: 7 (ref 5–15)
BUN: 50 mg/dL — ABNORMAL HIGH (ref 8–23)
CO2: 31 mmol/L (ref 22–32)
Calcium: 9.2 mg/dL (ref 8.9–10.3)
Chloride: 99 mmol/L (ref 98–111)
Creatinine, Ser: 2.16 mg/dL — ABNORMAL HIGH (ref 0.44–1.00)
GFR calc Af Amer: 27 mL/min — ABNORMAL LOW (ref >60)
GFR calc non Af Amer: 23 mL/min — ABNORMAL LOW (ref >60)
Glucose, Bld: 211 mg/dL — ABNORMAL HIGH (ref 70–99)
Potassium: 4.2 mmol/L (ref 3.5–5.1)
Sodium: 137 mmol/L (ref 135–145)

## 2019-02-05 LAB — RENAL FUNCTION PANEL
Albumin: 2.6 g/dL — ABNORMAL LOW (ref 3.5–5.0)
Anion gap: 6 (ref 5–15)
BUN: 41 mg/dL — ABNORMAL HIGH (ref 8–23)
CO2: 31 mmol/L (ref 22–32)
Calcium: 9 mg/dL (ref 8.9–10.3)
Chloride: 100 mmol/L (ref 98–111)
Creatinine, Ser: 1.98 mg/dL — ABNORMAL HIGH (ref 0.44–1.00)
GFR calc Af Amer: 30 mL/min — ABNORMAL LOW (ref >60)
GFR calc non Af Amer: 26 mL/min — ABNORMAL LOW (ref >60)
Glucose, Bld: 140 mg/dL — ABNORMAL HIGH (ref 70–99)
Phosphorus: 3.5 mg/dL (ref 2.5–4.6)
Potassium: 3.9 mmol/L (ref 3.5–5.1)
Sodium: 137 mmol/L (ref 135–145)

## 2019-02-05 LAB — GLUCOSE, CAPILLARY
Glucose-Capillary: 161 mg/dL — ABNORMAL HIGH (ref 70–99)
Glucose-Capillary: 152 mg/dL — ABNORMAL HIGH (ref 70–99)
Glucose-Capillary: 139 mg/dL — ABNORMAL HIGH (ref 70–99)
Glucose-Capillary: 144 mg/dL — ABNORMAL HIGH (ref 70–99)
Glucose-Capillary: 206 mg/dL — ABNORMAL HIGH (ref 70–99)
Glucose-Capillary: 159 mg/dL — ABNORMAL HIGH (ref 70–99)

## 2019-02-05 LAB — MAGNESIUM: Magnesium: 1.7 mg/dL (ref 1.7–2.4)

## 2019-02-05 MED ORDER — METOCLOPRAMIDE HCL 5 MG PO TABS: 5.0000 mg | ORAL_TABLET | Freq: Three times a day (TID) | ORAL | 1 refills | Status: DC

## 2019-02-05 MED ORDER — FUROSEMIDE 40 MG PO TABS: 40.0000 mg | ORAL_TABLET | Freq: Every day | ORAL | 1 refills | Status: DC | PRN

## 2019-02-05 MED ORDER — PRO-STAT SUGAR FREE PO LIQD: 30.0000 mL | Freq: Two times a day (BID) | ORAL | 0 refills | Status: DC

## 2019-02-05 MED ORDER — GABAPENTIN 100 MG PO CAPS: 100.0000 mg | ORAL_CAPSULE | Freq: Two times a day (BID) | ORAL | 0 refills | Status: DC

## 2019-02-05 MED ORDER — ENSURE ENLIVE PO LIQD: 237.0000 mL | Freq: Three times a day (TID) | ORAL | 12 refills | Status: DC

## 2019-02-05 MED ORDER — ERGOCALCIFEROL 1.25 MG (50000 UT) PO CAPS: 50000.0000 [IU] | ORAL_CAPSULE | ORAL | 1 refills | Status: AC

## 2019-02-05 MED ORDER — ALLOPURINOL 300 MG PO TABS: 150.0000 mg | ORAL_TABLET | Freq: Every day | ORAL | 1 refills | Status: DC

## 2019-02-05 MED ORDER — CARVEDILOL 25 MG PO TABS: 25.0000 mg | ORAL_TABLET | Freq: Two times a day (BID) | ORAL | 1 refills | Status: DC

## 2019-02-05 MED ORDER — SODIUM CHLORIDE 0.9 % IV BOLUS
250.0000 mL | Freq: Once | INTRAVENOUS | Status: AC
  Administered 2019-02-05: 250 mL via INTRAVENOUS

## 2019-02-05 MED ORDER — ISOSORB DINITRATE-HYDRALAZINE 20-37.5 MG PO TABS: 1.0000 | ORAL_TABLET | Freq: Three times a day (TID) | ORAL | 1 refills | Status: DC

## 2019-02-05 MED ORDER — DULOXETINE HCL 30 MG PO CPEP: 30.0000 mg | ORAL_CAPSULE | Freq: Every day | ORAL | 3 refills | Status: DC

## 2019-02-05 NOTE — Progress Notes (Signed)
Calorie Count Note: Day 1  48-hour calorie count ordered.  No calorie count envelope hanging on pt's door. RD spoke with RN yesterday who agreed to begin calorie count, but per RN today, calorie count was not started. RN to start documenting at lunch meal today. RD obtained the following information from speaking with pt and reviewing meal completion records.  RD will follow up tomorrow, 11/04, for the results of Day 2 of the calorie count.  Noted 3 unopened bottles of Ensure Enlive on pt's tray table. Pt reports she has not tried any yet. RD encouraged pt to drink supplements.  Diet: Soft Supplements: - Ensure Enlive po TID, each supplement provides 350 kcal and 20 grams of protein - Pro-stat 30 ml BID, each supplement provides 100 kcal and 15 grams of protein  Day 1: Lunch: no documentation Dinner: no documentation Breakfast: 186 kcal, 8 grams of protein Supplements: 300 kcal, 45 grams of protein (3 Pro-stat)  Total 24-hour intake: 486 kcal (28% of minimum estimated needs)  53 grams of protein (62% of minimum estimated needs)  Nutrition Diagnosis: Increased nutrient needs related to chronic illness (CHF) as evidenced by estimated needs.  Goal: Patient will meet greater than or equal to 90% of their needs  Intervention: - Continue calorie count - Ensure Enlive TID - Pro-stat BID   Gaynell Face, MS, RD, LDN Inpatient Clinical Dietitian Pager: [phone] Weekend/After Hours: [phone]

## 2019-02-05 NOTE — Progress Notes (Signed)
Patient creatinine elevated at 2.19 and still has not urinated. Paged MD. Per Marcie Bal, MD, give 250cc saline bolus and delay discharge until tomorrow. Renal function panel scheduled for tomorrow morning.

## 2019-02-05 NOTE — Progress Notes (Signed)
Bladder scan showed 25ml. MD ordered a STAT BMP to check renal function. Still hold off on discharging patient per MD

## 2019-02-05 NOTE — Progress Notes (Signed)
Progress Note  Patient Name: Kara Hanson Date of Encounter: 02/05/2019  Primary Cardiologist: Candee Furbish, MD   Subjective   + nausea, earlier a band around chest but really upper abd.  No BM per pt in a week.  Passing a little gas.   Inpatient Medications    Scheduled Meds: . albuterol  2.5 mg Nebulization BID  . allopurinol  150 mg Oral Daily  . aspirin EC  81 mg Oral Daily  . atorvastatin  80 mg Oral q1800  . carvedilol  25 mg Oral BID WC  . chlorhexidine gluconate (MEDLINE KIT)  15 mL Mouth Rinse BID  . Chlorhexidine Gluconate Cloth  6 each Topical Daily  . DULoxetine  30 mg Oral Daily  . enoxaparin (LOVENOX) injection  30 mg Subcutaneous Q24H  . feeding supplement (ENSURE ENLIVE)  237 mL Oral TID BM  . feeding supplement (PRO-STAT SUGAR FREE 64)  30 mL Oral BID  . gabapentin  100 mg Oral BID  . insulin aspart  0-15 Units Subcutaneous TID WC  . insulin aspart  0-5 Units Subcutaneous QHS  . isosorbide-hydrALAZINE  1 tablet Oral TID  . mouth rinse  15 mL Mouth Rinse BID  . pantoprazole (PROTONIX) IV  40 mg Intravenous Q12H   Continuous Infusions: . dexmedetomidine (PRECEDEX) IV infusion Stopped (02/01/19 1716)   PRN Meds: acetaminophen, albuterol, labetalol, Muscle Rub, ondansetron (ZOFRAN) IV, phenol, promethazine   Vital Signs    Vitals:   02/05/19 0800 02/05/19 0806 02/05/19 0900 02/05/19 1000  BP: (!) 165/59   112/88  Pulse: 77  72 84  Resp: 14  15 18   Temp: 98.2 F (36.8 C)     TempSrc: Oral     SpO2: 98% 100% 99% 98%  Weight:      Height:        Intake/Output Summary (Last 24 hours) at 02/05/2019 1012 Last data filed at 02/05/2019 0400 Gross per 24 hour  Intake 120 ml  Output 1150 ml  Net -1030 ml   Last 3 Weights 02/05/2019 02/02/2019 02/01/2019  Weight (lbs) 164 lb 14.5 oz 164 lb 10.9 oz 221 lb 5.5 oz  Weight (kg) 74.8 kg 74.7 kg 100.4 kg      Telemetry    SR occ PVC - Personally Reviewed  ECG    No new - Personally Reviewed   Physical Exam   GEN: No acute distress.   Neck: No JVD Cardiac: RRR, no murmurs, rubs, or gallops.  Respiratory: fine bibasilar rales to auscultation bilaterally. GI: Soft, nontender, non-distended hypoactive BS MS: No edema; No deformity. Neuro:  Nonfocal  Psych: Normal affect   Labs    High Sensitivity Troponin:   Recent Labs  Lab 01/23/19 1343 01/23/19 1555 01/23/19 2034  TROPONINIHS 10 11 13       Chemistry Recent Labs  Lab 02/03/19 0225 02/04/19 0251 02/05/19 0203  NA 138 137  137 137  K 3.8 3.7  3.7 3.9  CL 99 98  98 100  CO2 29 29  29 31   GLUCOSE 111* 131*  131* 140*  BUN 34* 36*  36* 41*  CREATININE 1.69* 1.93*  1.98* 1.98*  CALCIUM 8.8* 8.9  8.9 9.0  ALBUMIN  --  2.6* 2.6*  GFRNONAA 32* 27*  26* 26*  GFRAA 37* 31*  30* 30*  ANIONGAP 10 10  10 6      Hematology Recent Labs  Lab 02/03/19 0225 02/04/19 0251 02/05/19 0203  WBC 5.3 4.7 4.4  RBC 3.28*  3.31* 3.33*  HGB 8.8* 9.0* 9.0*  HCT 30.3* 30.4* 30.8*  MCV 92.4 91.8 92.5  MCH 26.8 27.2 27.0  MCHC 29.0* 29.6* 29.2*  RDW 14.7 14.7 14.7  PLT 266 253 259    BNPNo results for input(s): BNP, PROBNP in the last 168 hours.   DDimer No results for input(s): DDIMER in the last 168 hours.   Radiology    No results found.  Cardiac Studies   TTE 01/24/2019 1. Left ventricular ejection fraction, by visual estimation, is 40 to 45%. The left ventricle has normal function. There is moderately increased left ventricular hypertrophy. 2. Global hypokinesis, worse apically. 3. Left ventricular diastolic Doppler parameters are consistent with restrictive filling pattern of LV diastolic filling. 4. Elevated left atrial and left ventricular end-diastolic pressures. 5. Global right ventricle has mildly reduced systolic function.The right ventricular size is normal. No increase in right ventricular wall thickness. 6. Left atrial size was normal. 7. Right atrial size was normal. 8. Moderate  pleural effusion in the left lateral region. 9. The mitral valve is abnormal. Mild mitral valve regurgitation. 10. The tricuspid valve is grossly normal. Tricuspid valve regurgitation is mild. 11. The aortic valve is tricuspid Aortic valve regurgitation is trivial by color flow Doppler. Mild aortic valve sclerosis without stenosis. 12. The pulmonic valve was grossly normal. Pulmonic valve regurgitation is trivial by color flow Doppler. 13. Mildly elevated pulmonary artery systolic pressure. 14. The inferior vena cava is normal in size with <50% respiratory variability, suggesting right atrial pressure of 8 mmHg.   Patient Profile     63 y.o. female  with history of normal coronary arteries, Takotsubo cardiomyopathy, hypertension, hyperlipidemia who was admitted on 10/21 for acute decompensated systolic heart failure.   Assessment & Plan    Acute on chronic combined CHF now neg 5958 since admit, and wt from pk  of 76.2 kg on admit and now 74.8 kg.  Last lasix yesterday rec'd 120 total and now stopped.  Her BB has been increased to 25 BID and continues on BiDil.   BP from 157/56 to 112/88 add po diuretic today?  Defer to Dr. Audie Box  AKI was followed by Renal, Cr now 1.98 same as yesterday  --pk during hospitalization 3.02. renal has signed off.   Nausea per IM     For questions or updates, please contact [place] Please consult [email or web address] for contact info under        Signed, Cecilie Kicks, NP  02/05/2019, 10:12 AM

## 2019-02-05 NOTE — Progress Notes (Signed)
PROGRESS NOTE    Kara Hanson  HCW:[id number] DOB: 06-16-1955 DOA: 01/23/2019 PCP: [name], MD  Outpatient Specialists:   Brief Narrative: Patient is a 63 year old female with past medical history significant for diastolic versus combined systolic and diastolic congestive heart failure (left ventricular EF of 50% as per echo done on 11/01/2018), spinal stenosis, coronary artery disease, MI, chronic kidney disease stage III hypertension, hyperlipidemia, diabetes mellitus and CVA.  There is also documentation of recent Covid 19 infection necessitating inpatient care.  Patient was admitted with CHF exacerbation, acute kidney injury on CKD stage III, and left-sided pleural effusion noted to be transudative.  Patient was transferred to ICU team due to worsening respiratory symptoms.  Work-up by the ICU team revealed complete left lung atelectasis from endotracheal obstruction thought to be likely secondary to mucous plugging, status post intubation and extubation.  Pulmonary status has improved.  CHF symptoms have improved significantly.  Acute kidney injury, likely cardiorenal, has improved with diuretics.  Improved from critical care team, nephrology and cardiology appreciated.  Patient was transferred to the hospitalist service for further assessment and management.  02/04/2019: Patient seen.  Cardiology team is directing diuretics management.  IV Lasix 80 mg twice daily for today recommended by cardiology.   As per cardiology note, diuretics will be likely transition to oral in a.m.  Patient has remained stable.  No shortness of breath reported.  02/05/2019: Patient seen.  Patient continues to improve.  No new complaints.  Patient will be discharged back home once cleared for discharge by the cardiology team.  Assessment & Plan:   Active Problems:   Hyperlipidemia LDL goal <70   Acute on chronic combined systolic and diastolic CHF (congestive heart failure) (HCC)   Controlled type 2  diabetes with neuropathy (HCC)   CHF (congestive heart failure) (HCC)   Epigastric pain   Acute renal failure superimposed on stage 3 chronic kidney disease (HCC)   Acute on chronic respiratory failure with hypoxia (HCC)   Atelectasis  Acute on chronic combined systolic and diastolic congestive heart failure: -Cardiology team is directing care. -Symptoms have improved significantly. -Diuretics as per cardiology team -Echocardiogram revealed EF of 40 to 45% with restrictive filling program. -Continue to weigh patient daily. -Strict I's and O's. -Continue Coreg 12.5 Mg p.o. twice daily, BiDil 20-37.5 tabs 1 tab p.o. 3 times daily -Discharge patient home once cleared for discharge by the cardiology team.  Acute kidney injury on chronic kidney disease stage III: -AKI is improving. -Serum creatinine is 1.98 -Continue diuresis as per cardiology team.  Left pleural effusion: -Transudate. -No further work-up.  Mucous plugging of left lung: -Continue pulmonary toiletry. -Likely secondary to weakness. -Recent Covid infection in April of this year.  Hypertension: Continue current medication. Goal blood pressures less than 130/80 mmHg. Last blood pressure documented is 134/44 mmHg.  Diabetes mellitus: Patient is currently on sliding scale insulin coverage. Lantus is on hold. Continue calorie count.  Hyperlipidemia: Continue Lipitor.  DVT prophylaxis: Subcutaneous Lovenox Code Status: Full code Family Communication:  Disposition Plan: Likely home eventually.  Consult physical therapy   Consultants:   Cardiology  Nephrology  Patient was under the ICU team  Procedures:   Echocardiogram  Intubation and extubation  Antimicrobials:   None   Subjective: No new complaints No shortness of breath No chest pain No fever or chills  Objective: Vitals:   02/05/19 0700 02/05/19 0800 02/05/19 0806 02/05/19 0900  BP: (!) 157/56 (!) 165/59    Pulse: 78 77  72  Resp:  _0 Temp:  98.2 F (36.8 C)    TempSrc:  Oral    SpO2: 99% 98% 100% 99%  Weight:      Height:        Intake/Output Summary (Last 24 hours) at 02/05/2019 0957 Last data filed at 02/05/2019 0400 Gross per 24 hour  Intake 120 ml  Output 1150 ml  Net -1030 ml   Filed Weights   02/01/19 0500 02/02/19 0500 02/05/19 0417  Weight: 100.4 kg 74.7 kg 74.8 kg    Examination:  General exam: Appears calm and comfortable. Respiratory system: Crackles both lung bases posteriorly. Cardiovascular system: S1 & S2 heard. Gastrointestinal system: Abdomen is nondistended, soft and nontender. No organomegaly or masses felt. Normal bowel sounds heard. Central nervous system: Alert and oriented.  Patient moves all extremities.   Extremities: Minimal ankle edema  Data Reviewed: I have personally reviewed following labs and imaging studies  CBC: Recent Labs  Lab 02/01/19 0413 02/02/19 0202 02/03/19 0225 02/04/19 0251 02/05/19 0203  WBC 4.6 5.8 5.3 4.7 4.4  NEUTROABS 3.5 4.4 3.9 3.3 3.0  HGB 9.1* 9.5* 8.8* 9.0* 9.0*  HCT 30.0* 31.7* 30.3* 30.4* 30.8*  MCV 90.6 90.3 92.4 91.8 92.5  PLT 240 277 266 253 701   Basic Metabolic Panel: Recent Labs  Lab 02/01/19 0413 02/02/19 0202 02/03/19 0225 02/04/19 0251 02/05/19 0203  NA 137 138 138 137  137 137  K 3.8 3.5 3.8 3.7  3.7 3.9  CL 99 100 99 98  98 100  CO2 _1 GLUCOSE 92 54* 111* 131*  131* 140*  BUN 36* 35* 34* 36*  36* 41*  CREATININE 1.83* 1.77* 1.69* 1.93*  1.98* 1.98*  CALCIUM 8.8* 9.2 8.8* 8.9  8.9 9.0  MG  --   --  1.8 1.8 1.7  PHOS  --   --   --  3.6 3.5   GFR: Estimated Creatinine Clearance: 29.4 mL/min (A) (by C-G formula based on SCr of 1.98 mg/dL (H)). Liver Function Tests: Recent Labs  Lab 02/04/19 0251 02/05/19 0203  ALBUMIN 2.6* 2.6*   No results for input(s): LIPASE, AMYLASE in the last 168 hours. No results for input(s): AMMONIA in the last 168 hours. Coagulation Profile: No  results for input(s): INR, PROTIME in the last 168 hours. Cardiac Enzymes: No results for input(s): CKTOTAL, CKMB, CKMBINDEX, TROPONINI in the last 168 hours. BNP (last 3 results) No results for input(s): PROBNP in the last 8760 hours. HbA1C: No results for input(s): HGBA1C in the last 72 hours. CBG: Recent Labs  Lab 02/04/19 0824 02/04/19 1136 02/04/19 1657 02/04/19 2058 02/05/19 0728  GLUCAP 129* 196* 124* 133* 139*   Lipid Profile: No results for input(s): CHOL, HDL, LDLCALC, TRIG, CHOLHDL, LDLDIRECT in the last 72 hours. Thyroid Function Tests: No results for input(s): TSH, T4TOTAL, FREET4, T3FREE, THYROIDAB in the last 72 hours. Anemia Panel: No results for input(s): VITAMINB12, FOLATE, FERRITIN, TIBC, IRON, RETICCTPCT in the last 72 hours. Urine analysis:    Component Value Date/Time   COLORURINE YELLOW 01/28/2019 0714   APPEARANCEUR TURBID (A) 01/28/2019 0714   LABSPEC 1.015 01/28/2019 0714   PHURINE 5.0 01/28/2019 0714   GLUCOSEU 50 (A) 01/28/2019 0714   HGBUR NEGATIVE 01/28/2019 0714   BILIRUBINUR NEGATIVE 01/28/2019 0714   BILIRUBINUR negative 12/13/2017 [place] 01/28/2019 0714   PROTEINUR >=300 (A) 01/28/2019 0714   UROBILINOGEN 0.2 12/13/2017 1514  NITRITE NEGATIVE 01/28/2019 0714   LEUKOCYTESUR MODERATE (A) 01/28/2019 0714   Sepsis Labs: _0 (procalcitonin:4,lacticidven:4)  ) Recent Results (from the past 240 hour(s))  Culture, Urine     Status: Abnormal   Collection Time: 01/28/19  8:20 AM   Specimen: Urine, Random  Result Value Ref Range Status   Specimen Description   Final    URINE, RANDOM Performed at [facility] [address] [place], [place] [address]    Special Requests   Final    NONE Performed at [facility], [place] [address] [place], [place] [address]    Culture MULTIPLE SPECIES PRESENT, SUGGEST RECOLLECTION (A)  Final   Report Status 01/29/2019 FINAL  Final  Culture,  body fluid-bottle     Status: None   Collection Time: 01/30/19 12:38 PM   Specimen: Pleura  Result Value Ref Range Status   Specimen Description PLEURAL LEFT  Final   Special Requests NONE  Final   Culture   Final    NO GROWTH 5 DAYS Performed at [facility], [address] [place], [place] [address]    Report Status 02/04/2019 FINAL  Final  Gram stain     Status: None   Collection Time: 01/30/19 12:38 PM   Specimen: Pleura  Result Value Ref Range Status   Specimen Description PLEURAL LEFT  Final   Special Requests NONE  Final   Gram Stain   Final    MODERATE WBC PRESENT, PREDOMINANTLY MONONUCLEAR NO ORGANISMS SEEN Performed at [facility], [place] [address] [place], [place] [address]    Report Status 01/30/2019 FINAL  Final  Culture, respiratory (non-expectorated)     Status: None   Collection Time: 02/01/19  3:43 PM   Specimen: Bronchoalveolar Lavage; Respiratory  Result Value Ref Range Status   Specimen Description   Final    BRONCHIAL ALVEOLAR LAVAGE Performed at [facility], [place] [address] [place], [place] [address]    Special Requests   Final    NONE Performed at [facility], [place] [address] [place], [place] [address]    Gram Stain   Final    ABUNDANT WBC PRESENT,BOTH PMN AND MONONUCLEAR RARE GRAM POSITIVE COCCI    Culture   Final    Consistent with normal respiratory flora. Performed at [facility], [place] [address], [place], [place] [address]    Report Status 02/04/2019 FINAL  Final  MRSA PCR Screening     Status: None   Collection Time: 02/01/19  4:00 PM   Specimen: Nasal Mucosa; Nasopharyngeal  Result Value Ref Range Status   MRSA by PCR NEGATIVE NEGATIVE Final    Comment:        The GeneXpert MRSA Assay (FDA approved for NASAL specimens only), is one component of a comprehensive MRSA colonization surveillance program. It is not intended to diagnose MRSA infection nor to guide or  monitor treatment for MRSA infections. Performed at [facility], [place] [address], [place], [place] [address]          Radiology Studies: No results found.      Scheduled Meds: . albuterol  2.5 mg Nebulization BID  . allopurinol  150 mg Oral Daily  . aspirin EC  81 mg Oral Daily  . atorvastatin  80 mg Oral q1800  . carvedilol  25 mg Oral BID WC  . chlorhexidine gluconate (MEDLINE KIT)  15 mL Mouth Rinse BID  . Chlorhexidine Gluconate  Cloth  6 each Topical Daily  . DULoxetine  30 mg Oral Daily  . enoxaparin (LOVENOX) injection  30 mg Subcutaneous Q24H  . feeding supplement (ENSURE ENLIVE)  237 mL Oral TID BM  . feeding supplement (PRO-STAT SUGAR FREE 64)  30 mL Oral BID  . gabapentin  100 mg Oral BID  . insulin aspart  0-15 Units Subcutaneous TID WC  . insulin aspart  0-5 Units Subcutaneous QHS  . isosorbide-hydrALAZINE  1 tablet Oral TID  . mouth rinse  15 mL Mouth Rinse BID  . pantoprazole (PROTONIX) IV  40 mg Intravenous Q12H   Continuous Infusions: . dexmedetomidine (PRECEDEX) IV infusion Stopped (02/01/19 1716)     LOS: 13 days    Time spent: 25 Minutes.    Dana Allan, MD  [facility] Pager #: [phone] 7PM-7AM contact night coverage as above

## 2019-02-05 NOTE — Progress Notes (Signed)
Per Marcie Bal, MD, do not discharge patient until patient has urinated post foley cath removal at 11am. Patients daughter cannot pick up patient until at least 6pm.

## 2019-02-06 DIAGNOSIS — Z9289 Personal history of other medical treatment: Secondary | ICD-10-CM

## 2019-02-06 DIAGNOSIS — Z9889 Other specified postprocedural states: Secondary | ICD-10-CM

## 2019-02-06 LAB — RENAL FUNCTION PANEL
Albumin: 2.7 g/dL — ABNORMAL LOW (ref 3.5–5.0)
Anion gap: 7 (ref 5–15)
BUN: 54 mg/dL — ABNORMAL HIGH (ref 8–23)
CO2: 30 mmol/L (ref 22–32)
Calcium: 8.7 mg/dL — ABNORMAL LOW (ref 8.9–10.3)
Chloride: 98 mmol/L (ref 98–111)
Creatinine, Ser: 2.12 mg/dL — ABNORMAL HIGH (ref 0.44–1.00)
GFR calc Af Amer: 28 mL/min — ABNORMAL LOW (ref >60)
GFR calc non Af Amer: 24 mL/min — ABNORMAL LOW (ref >60)
Glucose, Bld: 172 mg/dL — ABNORMAL HIGH (ref 70–99)
Phosphorus: 3.1 mg/dL (ref 2.5–4.6)
Potassium: 4 mmol/L (ref 3.5–5.1)
Sodium: 135 mmol/L (ref 135–145)

## 2019-02-06 LAB — GLUCOSE, CAPILLARY
Glucose-Capillary: 157 mg/dL — ABNORMAL HIGH (ref 70–99)
Glucose-Capillary: 239 mg/dL — ABNORMAL HIGH (ref 70–99)
Glucose-Capillary: 150 mg/dL — ABNORMAL HIGH (ref 70–99)
Glucose-Capillary: 133 mg/dL — ABNORMAL HIGH (ref 70–99)

## 2019-02-06 LAB — BASIC METABOLIC PANEL
Anion gap: 8 (ref 5–15)
BUN: 48 mg/dL — ABNORMAL HIGH (ref 8–23)
CO2: 30 mmol/L (ref 22–32)
Calcium: 8.8 mg/dL — ABNORMAL LOW (ref 8.9–10.3)
Chloride: 99 mmol/L (ref 98–111)
Creatinine, Ser: 2.03 mg/dL — ABNORMAL HIGH (ref 0.44–1.00)
GFR calc Af Amer: 29 mL/min — ABNORMAL LOW (ref >60)
GFR calc non Af Amer: 25 mL/min — ABNORMAL LOW (ref >60)
Glucose, Bld: 176 mg/dL — ABNORMAL HIGH (ref 70–99)
Potassium: 4.1 mmol/L (ref 3.5–5.1)
Sodium: 137 mmol/L (ref 135–145)

## 2019-02-06 LAB — MAGNESIUM: Magnesium: 1.8 mg/dL (ref 1.7–2.4)

## 2019-02-06 MED ORDER — SODIUM CHLORIDE 0.9 % IV SOLN
INTRAVENOUS | Status: AC
  Administered 2019-02-06: 10:00:00 via INTRAVENOUS

## 2019-02-06 MED ORDER — INSULIN GLARGINE 100 UNIT/ML ~~LOC~~ SOLN
10.0000 [IU] | Freq: Every day | SUBCUTANEOUS | Status: DC
  Administered 2019-02-06: 10 [IU] via SUBCUTANEOUS
  Filled 2019-02-06 (×2): qty 0.1

## 2019-02-06 MED ORDER — BISACODYL 5 MG PO TBEC
10.0000 mg | DELAYED_RELEASE_TABLET | Freq: Every day | ORAL | Status: DC
  Administered 2019-02-06 – 2019-02-07 (×2): 10 mg via ORAL
  Filled 2019-02-06 (×2): qty 2

## 2019-02-06 NOTE — Progress Notes (Signed)
PROGRESS NOTE    Kara Hanson  KYH:[id number] DOB: September 09, 1955 DOA: 01/23/2019 PCP: [name], MD   Brief Narrative: 63 year old female with past medical history significant for diastolic versus combined systolic and diastolic congestive heart failure (left ventricular EF of 50% as per echo done on 11/01/2018), spinal stenosis, coronary artery disease, MI, chronic kidney disease stage III hypertension, hyperlipidemia, diabetes mellitus and CVA.  There is also documentation of recent Covid 19 infection necessitating inpatient care.  Patient was admitted with CHF exacerbation, acute kidney injury on CKD stage III, and left-sided pleural effusion noted to be transudative.  Patient was transferred to ICU team due to worsening respiratory symptoms.  Work-up by the ICU team revealed complete left lung atelectasis from endotracheal obstruction thought to be likely secondary to mucous plugging, status post intubation and extubation.  Pulmonary status has improved.  CHF symptoms have improved significantly.  Acute kidney injury, likely cardiorenal, has improved with diuretics.  Improved from critical care team, nephrology and cardiology appreciated.  Patient was transferred to the hospitalist service for further assessment and management. Assessment & Plan:   Active Problems:   Hyperlipidemia LDL goal <70   Acute on chronic combined systolic and diastolic CHF (congestive heart failure) (HCC)   Controlled type 2 diabetes with neuropathy (HCC)   CHF (congestive heart failure) (HCC)   Epigastric pain   Acute renal failure superimposed on stage 3 chronic kidney disease (HCC)   Acute on chronic respiratory failure with hypoxia (HCC)   Atelectasis    #1 acute on chronic combined systolic diastolic heart failure diuretics currently on hold due to creatinine trending up.  Echo shows ejection fraction 40 to 45%.  She appears more on the dry side now.  Continue Coreg and BiDil. She is negative by 5.6 L.  #2 AKI on CKD stage III diuretics on hold creatinine trending up we will give IV fluids and recheck labs tomorrow if trending down we will plan on discharge home tomorrow  #3 acute hypoxic respiratory failure secondary to mucous plugging of the left lung and congestive heart failure with left pleural effusion stable  #4 hypertension blood pressure 135/55 on Coreg 25 mg twice a day, BiDil 1 tablet 3 times a day  #5 type 2 diabetes blood sugar 157, 159, 239 patient was on Lantus at home will restart Lantus at a lower dose.  #6 hyperlipidemia continue Lipitor  #7 gout continue allopurinol    Nutrition Problem: Increased nutrient needs Etiology: chronic illness(CHF)     Signs/Symptoms: estimated needs    Interventions: Ensure Enlive (each supplement provides 350kcal and 20 grams of protein), Prostat, Calorie Count  Estimated body mass index is 26.98 kg/m as calculated from the following:   Height as of this encounter: 5' 5.51" (1.664 m).   Weight as of this encounter: 74.7 kg.  DVT prophylaxis: Lovenox Code Status: Full code Family Communication: None at bedside Disposition Plan: Pending improvement in renal function   Consultants:  Cardiology, nephrology, PCCM Procedures: Intubation, echocardiogram Antimicrobials: None  Subjective: Resting in bed denies any shortness of breath or chest pain.  Objective: Vitals:   02/06/19 0824 02/06/19 1000 02/06/19 1200 02/06/19 1400  BP:  (!) 121/54 (!) 158/67 (!) 135/55  Pulse:  78 72 73  Resp:  _0 Temp:      TempSrc:      SpO2: 96% 97% 96% 99%  Weight:      Height:        Intake/Output Summary (Last 24 hours)  at 02/06/2019 1534 Last data filed at 02/06/2019 1500 Gross per 24 hour  Intake 915.54 ml  Output 675 ml  Net 240.54 ml   Filed Weights   02/02/19 0500 02/05/19 0417 02/06/19 0500  Weight: 74.7 kg 74.8 kg 74.7 kg    Examination: Oral mucosa dry General exam: Appears calm and comfortable  Respiratory  system: Clear to auscultation. Respiratory effort normal. Cardiovascular system: S1 & S2 heard, RRR. No JVD, murmurs, rubs, gallops or clicks. No pedal edema. Gastrointestinal system: Abdomen is nondistended, soft and nontender. No organomegaly or masses felt. Normal bowel sounds heard. Central nervous system: Alert and oriented. No focal neurological deficits. Extremities: Symmetric 5 x 5 power. Skin: No rashes, lesions or ulcers Psychiatry: Judgement and insight appear normal. Mood & affect appropriate.     Data Reviewed: I have personally reviewed following labs and imaging studies  CBC: Recent Labs  Lab 02/01/19 0413 02/02/19 0202 02/03/19 0225 02/04/19 0251 02/05/19 0203  WBC 4.6 5.8 5.3 4.7 4.4  NEUTROABS 3.5 4.4 3.9 3.3 3.0  HGB 9.1* 9.5* 8.8* 9.0* 9.0*  HCT 30.0* 31.7* 30.3* 30.4* 30.8*  MCV 90.6 90.3 92.4 91.8 92.5  PLT 240 277 266 253 607   Basic Metabolic Panel: Recent Labs  Lab 02/03/19 0225 02/04/19 0251 02/05/19 0203 02/05/19 1659 02/06/19 0156  NA 138 137  137 137 137 135  137  K 3.8 3.7  3.7 3.9 4.2 4.0  4.1  CL 99 98  98 100 99 98  99  CO2 _0 GLUCOSE 111* 131*  131* 140* 211* 172*  176*  BUN 34* 36*  36* 41* 50* 54*  48*  CREATININE 1.69* 1.93*  1.98* 1.98* 2.16* 2.12*  2.03*  CALCIUM 8.8* 8.9  8.9 9.0 9.2 8.7*  8.8*  MG 1.8 1.8 1.7  --  1.8  PHOS  --  3.6 3.5  --  3.1   GFR: Estimated Creatinine Clearance: 28.6 mL/min (A) (by C-G formula based on SCr of 2.03 mg/dL (H)). Liver Function Tests: Recent Labs  Lab 02/04/19 0251 02/05/19 0203 02/06/19 0156  ALBUMIN 2.6* 2.6* 2.7*   No results for input(s): LIPASE, AMYLASE in the last 168 hours. No results for input(s): AMMONIA in the last 168 hours. Coagulation Profile: No results for input(s): INR, PROTIME in the last 168 hours. Cardiac Enzymes: No results for input(s): CKTOTAL, CKMB, CKMBINDEX, TROPONINI in the last 168 hours. BNP (last 3 results) No  results for input(s): PROBNP in the last 8760 hours. HbA1C: No results for input(s): HGBA1C in the last 72 hours. CBG: Recent Labs  Lab 02/05/19 1140 02/05/19 1657 02/05/19 2127 02/06/19 0833 02/06/19 1229  GLUCAP 144* 206* 159* 157* 239*   Lipid Profile: No results for input(s): CHOL, HDL, LDLCALC, TRIG, CHOLHDL, LDLDIRECT in the last 72 hours. Thyroid Function Tests: No results for input(s): TSH, T4TOTAL, FREET4, T3FREE, THYROIDAB in the last 72 hours. Anemia Panel: No results for input(s): VITAMINB12, FOLATE, FERRITIN, TIBC, IRON, RETICCTPCT in the last 72 hours. Sepsis Labs: Recent Labs  Lab 01/31/19 1611 02/01/19 0413 02/02/19 0202  PROCALCITON <0.10 <0.10 <0.10    Recent Results (from the past 240 hour(s))  Culture, Urine     Status: Abnormal   Collection Time: 01/28/19  8:20 AM   Specimen: Urine, Random  Result Value Ref Range Status   Specimen Description   Final    URINE, RANDOM Performed at [facility], [place] [address]  [place], [place] [address]    Special Requests   Final    NONE Performed at [facility], [place] [address], [place], [place] [address]    Culture MULTIPLE SPECIES PRESENT, SUGGEST RECOLLECTION (A)  Final   Report Status 01/29/2019 FINAL  Final  Culture, body fluid-bottle     Status: None   Collection Time: 01/30/19 12:38 PM   Specimen: Pleura  Result Value Ref Range Status   Specimen Description PLEURAL LEFT  Final   Special Requests NONE  Final   Culture   Final    NO GROWTH 5 DAYS Performed at [facility], [address] [place], [place] [address]    Report Status 02/04/2019 FINAL  Final  Gram stain     Status: None   Collection Time: 01/30/19 12:38 PM   Specimen: Pleura  Result Value Ref Range Status   Specimen Description PLEURAL LEFT  Final   Special Requests NONE  Final   Gram Stain   Final    MODERATE WBC PRESENT, PREDOMINANTLY MONONUCLEAR NO ORGANISMS SEEN Performed at [facility], [place] [address], [place], [place] [address]    Report Status 01/30/2019 FINAL  Final  Culture, respiratory (non-expectorated)     Status: None   Collection Time: 02/01/19  3:43 PM   Specimen: Bronchoalveolar Lavage; Respiratory  Result Value Ref Range Status   Specimen Description   Final    BRONCHIAL ALVEOLAR LAVAGE Performed at [facility], [place] [address] [place], [place] [address]    Special Requests   Final    NONE Performed at [facility], [place] [address], [place], [place] [address]    Gram Stain   Final    ABUNDANT WBC PRESENT,BOTH PMN AND MONONUCLEAR RARE GRAM POSITIVE COCCI    Culture   Final    Consistent with normal respiratory flora. Performed at [facility], [place] [address] [place], [place] [address]    Report Status 02/04/2019 FINAL  Final  MRSA PCR Screening     Status: None   Collection Time: 02/01/19  4:00 PM   Specimen: Nasal Mucosa; Nasopharyngeal  Result Value Ref Range Status   MRSA by PCR NEGATIVE NEGATIVE Final    Comment:        The GeneXpert MRSA Assay (FDA approved for NASAL specimens only), is one component of a comprehensive MRSA colonization surveillance program. It is not intended to diagnose MRSA infection nor to guide or monitor treatment for MRSA infections. Performed at [facility], [place] [address], [place], [place] [address]          Radiology Studies: No results found.      Scheduled Meds: . albuterol  2.5 mg Nebulization BID  . allopurinol  150 mg Oral Daily  . aspirin EC  81 mg Oral Daily  . atorvastatin  80 mg Oral q1800  . carvedilol  25 mg Oral BID WC  . chlorhexidine gluconate (MEDLINE KIT)  15 mL Mouth Rinse BID  . Chlorhexidine Gluconate Cloth  6 each Topical Daily  . DULoxetine  30 mg Oral Daily  . enoxaparin (LOVENOX) injection  30 mg Subcutaneous Q24H  . feeding supplement (ENSURE ENLIVE)  237 mL Oral TID BM  . feeding  supplement (PRO-STAT SUGAR FREE 64)  30 mL Oral BID  . gabapentin  100 mg Oral BID  . insulin aspart  0-15 Units Subcutaneous TID WC  . insulin aspart  0-5 Units Subcutaneous  QHS  . isosorbide-hydrALAZINE  1 tablet Oral TID  . mouth rinse  15 mL Mouth Rinse BID  . pantoprazole (PROTONIX) IV  40 mg Intravenous Q12H   Continuous Infusions: . dexmedetomidine (PRECEDEX) IV infusion Stopped (02/01/19 1716)     LOS: 14 days     Georgette Shell, MD [facility]  If 7PM-7AM, please contact night-coverage [email or web address] Password [facility] 02/06/2019, 3:34 PM

## 2019-02-06 NOTE — Progress Notes (Signed)
Physical Therapy Treatment Patient Details Name: Kara Hanson MRN: [id number] DOB: 1955/12/09 Today's Date: 02/06/2019    History of Present Illness Patient is a 30 female with history of congestive heart failure with action fraction 50% as per echo on 11/01/2018 who presented with shortness of breath, dyspnea on exertion, orthopnea.  Pt was COVID-19 4/9-4/22/20 and COVID-19 PNA 09/24/18. She is on 4 L of oxygen at home 24/7 for her chronic hypoxia.  She had bronchoscopy on 10/30, was intubated, and remained intubated until 10/31.  PT has received reorders.    PT Comments    The patient ambulated x 200' with RW and min guard on 2 L [place], SPO2 >93%(Noted after that pt was on 3 L). Patient tolerated very well. Continue PT for progressive mobility.  Follow Up Recommendations  Home health PT;Supervision/Assistance - 24 hour     Equipment Recommendations  None recommended by PT    Recommendations for Other Services       Precautions / Restrictions Precautions Precautions: Fall Precaution Comments: on O2, used 2 L today    Mobility  Bed Mobility               General bed mobility comments: OOB  Transfers Overall transfer level: Needs assistance Equipment used: Rolling walker (2 wheeled) Transfers: Sit to/from Stand Sit to Stand: Supervision         General transfer comment: no assistance  Ambulation/Gait Ambulation/Gait assistance: Min guard Gait Distance (Feet): 200 Feet Assistive device: Rolling walker (2 wheeled) Gait Pattern/deviations: Step-through pattern Gait velocity: decr   General Gait Details: gait smoothe ans safe, stopped x 2 for a stand break. patient on 2  L , SPO2 > 93%, HR 95, RR 23-25   Stairs             Wheelchair Mobility    Modified Rankin (Stroke Patients Only)       Balance     Sitting balance-Leahy Scale: Good     Standing balance support: Bilateral upper extremity supported Standing balance-Leahy Scale: Fair Standing  balance comment: rsands from recliner without UE suport                            Cognition Arousal/Alertness: Awake/alert Behavior During Therapy: WFL for tasks assessed/performed                                          Exercises      General Comments        Pertinent Vitals/Pain Pain Assessment: No/denies pain    Home Living                      Prior Function            PT Goals (current goals can now be found in the care plan section) Progress towards PT goals: Progressing toward goals    Frequency    Min 3X/week      PT Plan Current plan remains appropriate    Co-evaluation              AM-PAC PT "6 Clicks" Mobility   Outcome Measure  Help needed turning from your back to your side while in a flat bed without using bedrails?: A Little Help needed moving from lying on your back to sitting on the side of a flat  bed without using bedrails?: A Little Help needed moving to and from a bed to a chair (including a wheelchair)?: A Little Help needed standing up from a chair using your arms (e.g., wheelchair or bedside chair)?: A Little Help needed to walk in hospital room?: A Little Help needed climbing 3-5 steps with a railing? : A Little 6 Click Score: 18    End of Session Equipment Utilized During Treatment: Oxygen Activity Tolerance: Patient tolerated treatment well Patient left: in chair;with call bell/phone within reach Nurse Communication: Mobility status PT Visit Diagnosis: Muscle weakness (generalized) (M62.81);Difficulty in walking, not elsewhere classified (R26.2)     Time: 5329-9242 PT Time Calculation (min) (ACUTE ONLY): 27 min  Charges:  $Gait Training: 23-37 mins                     [place] Pager [phone] Office [phone]    Claretha Cooper 02/06/2019, 3:00 PM

## 2019-02-06 NOTE — Progress Notes (Signed)
Calorie Count Note  48-hour calorie count ordered. Day 2 results below.  Patient expected to discharge soon. Was originally planned for 11/3.   Diet: Soft Supplements: - Ensure Enlive po TID, each supplement provides 350 kcal and 20 grams of protein - Pro-stat 30 ml BID, each supplement provides 100 kcal and 15 grams of protein  Day 2: Lunch: not documented Dinner: 74 kcal, 4g protein Breakfast: not documented Supplements: 550 kcal, 50 grams of protein   Total 24-hour intake: 624 kcal (35% of minimum estimated needs)  54 grams of protein (63% of minimum estimated needs)  Nutrition Diagnosis: Increased nutrient needs related to chronic illness (CHF)as evidenced by estimated needs.  Goal: Patient will meet greater than or equal to 90% of their needs  Intervention: - D/c calorie count - Ensure Enlive TID - Pro-stat BID  Clayton Bibles, MS, RD, LDN Inpatient Clinical Dietitian Pager: [phone] After Hours Pager: [phone]

## 2019-02-06 NOTE — Progress Notes (Signed)
  Speech Language Pathology Treatment: Dysphagia  Patient Details Name: Kara Hanson MRN: [id number] DOB: May 31, 1955 Today's Date: 02/06/2019 Time: 4585-9292 SLP Time Calculation (min) (ACUTE ONLY): 10 min  Assessment / Plan / Recommendation Clinical Impression  Reviewed esophageal precautions that were detailed on initial evaluation including eating small frequent meals conducting dry swallows to clear.  She does report sensing food and drink lodging at distal esophagus and also backflowing to the level of the throat at times.  She states this will occur with drinks and foods alone regardless.  Pt again confirmed her discomfort in her esophagus with po prior to admit but states she has never had backflowing.  She reports this is not better nor worse than since her extubation.  Relayed information to RN and advised pt talk to MD if her symptoms do not improve.  Pt's voice at this time is strong and near baseline and she denies coughing with intake.    Recommend continue diet as tolerated.  Resolution of possible pharyngeal dysphagia from edema s/p intubation.  All education completed.  Thanks.    HPI HPI: 63yo female admitted 01/23/2019 with SOB. PMH: CHF, CVA, DM, HTN, MI, PNA July 2020, ChestCT =Prominent secretions within the left mainstem and left upper andlower lobe bronchi with near complete collapse of the left upperlobe and complete collapse of the left lower lobe. The presence ofair bronchograms in the left upper and lower lobes may reflectsuperimposed multifocal pneumonia.      SLP Plan  All goals met       Recommendations  Diet recommendations: Dysphagia 3 (mechanical soft);Thin liquid(as tolerated) Liquids provided via: Cup;Straw Medication Administration: Whole meds with puree Supervision: Patient able to self feed Compensations: Slow rate;Small sips/bites(drink liquids t/o meal) Postural Changes and/or Swallow Maneuvers: Seated upright 90 degrees;Upright 30-60 min after  meal                Oral Care Recommendations: Oral care BID;Other (Comment)(remove dentures nightly) Follow up Recommendations: None(tbd) SLP Visit Diagnosis: Dysphagia, unspecified (R13.10)(suspect pharyngeal) Plan: All goals met       GO                Macario Golds 02/06/2019, 4:45 PM   Luanna Salk, [place] [facility] SLP [place] Pager [phone] Office [phone]

## 2019-02-06 NOTE — TOC Progression Note (Signed)
Transition of Care [facility]) - Progression Note    Patient Details  Name: Kara Hanson MRN: [id number] Date of Birth: 1955/12/17  Transition of Care [facility]) CM/SW [place], [place] Phone Number: 02/06/2019, 11:35 AM  Clinical Narrative:    CSW met with patient at bedside to discuss home health options/Patient continues to decline home health. Patient reports her daughter and granddaughter will continue to assist with her care at home.  Patient reports she is ambulatory without assistance/independent w/ ADL's.    Expected Discharge Plan: Home/Self Care Barriers to Discharge: Continued Medical Work up  Expected Discharge Plan and Services Expected Discharge Plan: Home/Self Care   Discharge Planning Services: CM Consult   Living arrangements for the past 2 months: Apartment Expected Discharge Date: 02/05/19               DME Arranged: N/A DME Agency: NA       HH Arranged: Patient Refused [place]           Social Determinants of Health (SDOH) Interventions    Readmission Risk Interventions Readmission Risk Prevention Plan 02/06/2019 02/06/2019 01/27/2019  Transportation Screening Complete - Complete  PCP or Specialist Appt within 3-5 Days - - Not Complete  Not Complete comments - - not ready to dc  HRI or [place] - - Complete  Social Work Consult for [place] Planning/Counseling - - Complete  Palliative Care Screening - - Not Applicable  Medication Review Press photographer) Complete - Complete  PCP or Specialist appointment within 3-5 days of discharge Complete Complete -  [place] or Home Care Consult Patient refused Patient refused -  SW Recovery Care/Counseling Consult Complete - -  Palliative Care Screening Not Applicable - -  [place] Not Applicable - -  Some recent data might be hidden

## 2019-02-07 DIAGNOSIS — Z9889 Other specified postprocedural states: Secondary | ICD-10-CM

## 2019-02-07 LAB — BASIC METABOLIC PANEL
Anion gap: 8 (ref 5–15)
BUN: 46 mg/dL — ABNORMAL HIGH (ref 8–23)
CO2: 29 mmol/L (ref 22–32)
Calcium: 9 mg/dL (ref 8.9–10.3)
Chloride: 100 mmol/L (ref 98–111)
Creatinine, Ser: 1.65 mg/dL — ABNORMAL HIGH (ref 0.44–1.00)
GFR calc Af Amer: 38 mL/min — ABNORMAL LOW (ref >60)
GFR calc non Af Amer: 32 mL/min — ABNORMAL LOW (ref >60)
Glucose, Bld: 166 mg/dL — ABNORMAL HIGH (ref 70–99)
Potassium: 4.1 mmol/L (ref 3.5–5.1)
Sodium: 137 mmol/L (ref 135–145)

## 2019-02-07 LAB — GLUCOSE, CAPILLARY
Glucose-Capillary: 134 mg/dL — ABNORMAL HIGH (ref 70–99)
Glucose-Capillary: 165 mg/dL — ABNORMAL HIGH (ref 70–99)
Glucose-Capillary: 90 mg/dL (ref 70–99)

## 2019-02-07 LAB — MAGNESIUM: Magnesium: 1.7 mg/dL (ref 1.7–2.4)

## 2019-02-07 MED ORDER — LACTULOSE 10 GM/15ML PO SOLN
20.0000 g | Freq: Once | ORAL | Status: AC
  Administered 2019-02-07: 20 g via ORAL
  Filled 2019-02-07: qty 30

## 2019-02-07 MED ORDER — MAGNESIUM SULFATE 2 GM/50ML IV SOLN
2.0000 g | Freq: Once | INTRAVENOUS | Status: AC
  Administered 2019-02-07: 2 g via INTRAVENOUS
  Filled 2019-02-07: qty 50

## 2019-02-07 MED ORDER — FUROSEMIDE 40 MG PO TABS: 40.0000 mg | ORAL_TABLET | Freq: Every day | ORAL | 1 refills | Status: DC

## 2019-02-07 MED ORDER — BISACODYL 5 MG PO TBEC: 10.0000 mg | DELAYED_RELEASE_TABLET | Freq: Every day | ORAL | 0 refills | Status: DC

## 2019-02-07 MED ORDER — BISACODYL 10 MG RE SUPP
10.0000 mg | Freq: Once | RECTAL | Status: AC
  Administered 2019-02-07: 10 mg via RECTAL
  Filled 2019-02-07: qty 1

## 2019-02-07 MED ORDER — ENOXAPARIN SODIUM 40 MG/0.4ML ~~LOC~~ SOLN: 40.0000 mg | SUBCUTANEOUS | Status: DC

## 2019-02-07 NOTE — Progress Notes (Signed)
Unit RN contacted for assistance with transportation. [place], [place] ED TOC CM [phone]

## 2019-02-07 NOTE — Progress Notes (Signed)
CSW received a call from pt's CN who stated pt's daughter arrived to p/u pt without pt's oxygen tank.  Per the [facility] RN CM, oxygen cannot be obtained until 02/07/2019.  Pt is agreeable to PTAR and this was witnessed by RN as pt's choice so CSW wil prepare D/C packet to utilize PTAR per pt's wishes.  CSW spoke to RN who confirmed with pt she resides at:  [address] [place], [place] [address].  CSW confirmed with RN:  Pt is full code and has HX of Covid, CHF, CVA, generalized weakness and is on 3 liters of nasal cannula.  CSW will continue to follow for D/C needs.  Alphonse Guild. Connie Hilgert, LCSW, LCAS, CSI Transitions of Care Clinical Social Worker Care Coordination Department Ph: [phone]

## 2019-02-07 NOTE — Progress Notes (Signed)
Pt taken by PTAR to home. IV's have been removed and vitals were are WDL. Pt's pants sent home in a patient belongings bag. D/C instructions were went over and patient had no questions.

## 2019-02-07 NOTE — Progress Notes (Signed)
CSW called PT[facility] and is walking D/C packet to pt's RN now.  CSW will continue to follow for D/C needs.  Alphonse Guild. Suzan Manon, LCSW, LCAS, CSI Transitions of Care Clinical Social Worker Care Coordination Department Ph: [phone]

## 2019-02-07 NOTE — Discharge Summary (Addendum)
Physician Discharge Summary  Kara Hanson EYC:[id number] DOB: [birth year] DOA: 01/23/2019  PCP: [name], MD  Admit date: 01/23/2019 Discharge date: 02/07/2019  Admitted From:home Disposition:  home  Recommendations for Outpatient Follow-up:  1. Follow up with PCP in 1-2 weeks 2. Please obtain BMP/CBC in one week 3. Please follow up with dr Marlou Porch  Home Health:pt Equipment/Devices:none Discharge Condition:stable CODE STATUS full Diet recommendation cardiac Brief/Interim Summary:63 year old female with past medical history significant for diastolic versus combined systolic and diastolic congestive heart failure (left ventricular EF of 50% as per echo done on 11/01/2018), spinal stenosis, coronary artery disease, MI, chronic kidney disease stage III hypertension, hyperlipidemia, diabetes mellitus and CVA. There is also documentation of recent Covid 19 infection necessitating inpatient care. Patient was admitted with CHF exacerbation, acute kidney injury on CKD stage III, and left-sided pleural effusion noted to be transudative. Patient was transferred to ICU team due to worsening respiratory symptoms. Work-up by the ICU team revealed complete left lung atelectasis from endotracheal obstruction thought to be likely secondary to mucous plugging, status post intubation and extubation. Pulmonary status has improved. CHF symptoms have improved significantly. Acute kidney injury, likely cardiorenal, has improved with diuretics. Improved from critical care team, nephrology and cardiology appreciated. Patient was transferred to the hospitalist service for further assessment and management.  Discharge Diagnoses:  Active Problems:   Hyperlipidemia LDL goal <70   Acute on chronic combined systolic and diastolic CHF (congestive heart failure) (HCC)   AKI (acute kidney injury) ([place])   Controlled type 2 diabetes with neuropathy (HCC)   CHF (congestive heart failure) (HCC)   Epigastric  pain   Acute renal failure superimposed on stage 3 chronic kidney disease (HCC)   Acute on chronic respiratory failure with hypoxia (HCC)   Atelectasis   History of ETT  #1 acute on chronic combined systolic diastolic heart failure-she was initially treated with aggressive diuretics and was followed by cardiology.  Few days prior to discharge her creatinine started trending up at that point diuretics were stopped.  She got fluid boluses and her renal function started to improve.  Today she is being discharged home and a creatinine on discharge is 1.65.  She was on Lasix 40 mg twice a day prior to admission to hospital.  She will continue Lasix once a day at home.  She will follow up with Dr. Marlou Porch with cardiology.  Echo shows ejection fraction 40 to 45%.  Continue Coreg and BiDil. Restart Lasix 40 mg once a day 02/10/2019.  #2 AKI on CKD stage III diuretics were  on hold creatinine trending down.  She received IV fluids overnight and with improvement in her creatinine.    #3 acute hypoxic respiratory failure secondary to mucous plugging of the left lung and congestive heart failure with left pleural effusion.  Patient had to be intubated.  Stable on discharge.  #4 hypertension blood pressure 135/55 on Coreg 25 mg twice a day, BiDil 1 tablet 3 times a day  #5 type 2 diabetes continue Lantus  #6 hyperlipidemia continue Lipitor  #7 gout continue allopurinol  #8 constipation -stool softners    #9  Generalized weakness patient was seen by physical therapy recommended home PT for gait training transfer training and stair training.  Patient is unable to leave home without assistance and have unsafe ambulation due to balance issues.    Nutrition Problem: Increased nutrient needs Etiology: chronic illness(CHF)    Signs/Symptoms: estimated needs     Interventions: Ensure Enlive (each supplement provides  350kcal and 20 grams of protein), Prostat, Calorie Count  Estimated body mass  index is 27.84 kg/m as calculated from the following:   Height as of this encounter: 5' 5.51" (1.664 m).   Weight as of this encounter: 77.1 kg.  Discharge Instructions  Discharge Instructions    Call MD for:  difficulty breathing, headache or visual disturbances   Complete by: As directed    Diet - low sodium heart healthy   Complete by: As directed    Diet - low sodium heart healthy   Complete by: As directed    Diet Carb Modified   Complete by: As directed    Increase activity slowly   Complete by: As directed    Increase activity slowly   Complete by: As directed      Allergies as of 02/07/2019      Reactions   Garlic Shortness Of Breath, Itching, Swelling   Hand itching and swelling   Latex Itching   Morphine And Related Itching, Other (See Comments)   Headache   Other Itching   Reaction to newspaper ink -itching and headache      Medication List    STOP taking these medications   amLODipine 5 MG tablet Commonly known as: NORVASC   guaiFENesin-dextromethorphan 100-10 MG/5ML syrup Commonly known as: ROBITUSSIN DM   lactobacillus acidophilus & bulgar chewable tablet   methylPREDNISolone 4 MG tablet Commonly known as: Medrol   omeprazole 20 MG capsule Commonly known as: PRILOSEC   ondansetron 4 MG tablet Commonly known as: ZOFRAN     TAKE these medications   Accu-Chek FastClix Lancets Misc USE AS DIRECTED TO TEST BLOOD SUGAR THREE TIMES DAILY   Accu-Chek Guide w/Device Kit 1 each by Does not apply route 3 (three) times daily.   allopurinol 300 MG tablet Commonly known as: ZYLOPRIM Take 0.5 tablets (150 mg total) by mouth daily. What changed: how much to take   aspirin EC 81 MG tablet Take 81 mg by mouth daily.   atorvastatin 80 MG tablet Commonly known as: LIPITOR Take 1 tablet (80 mg total) by mouth daily at 6 PM.   Basaglar KwikPen 100 UNIT/ML Sopn Inject 0.05 mLs (5 Units total) into the skin at bedtime. What changed: how much to take    bisacodyl 5 MG EC tablet Commonly known as: DULCOLAX Take 2 tablets (10 mg total) by mouth daily for 4 days.   carvedilol 25 MG tablet Commonly known as: COREG Take 1 tablet (25 mg total) by mouth 2 (two) times daily with a meal. What changed:   medication strength  how much to take   DULoxetine 30 MG capsule Commonly known as: CYMBALTA Take 1 capsule (30 mg total) by mouth daily. What changed:   medication strength  how much to take  additional instructions   Easy Comfort Pen Needles 31G X 5 MM Misc Generic drug: Insulin Pen Needle USE FOUR TIMES PER DAY FOR INSULIN ADMINISTRATION What changed: See the new instructions.   ergocalciferol 1.25 MG (50000 UT) capsule Commonly known as: Drisdol Take 1 capsule (50,000 Units total) by mouth once a week for 4 doses.   feeding supplement (ENSURE ENLIVE) Liqd Take 237 mLs by mouth 3 (three) times daily between meals.   feeding supplement (PRO-STAT SUGAR FREE 64) Liqd Take 30 mLs by mouth 2 (two) times daily.   furosemide 40 MG tablet Commonly known as: LASIX Take 1 tablet (40 mg total) by mouth daily. What changed: when to take this  gabapentin 100 MG capsule Commonly known as: NEURONTIN Take 1 capsule (100 mg total) by mouth 2 (two) times daily. What changed:   medication strength  how much to take   glucose blood test strip Commonly known as: Accu-Chek Guide USE AS DIRECTED TO TEST BLOOD SUGAR THREE TIMES DAILY   insulin aspart 100 UNIT/ML injection Commonly known as: novoLOG 0 to 12 units subcutaneously 3 times daily before meals as per sliding scale   isosorbide-hydrALAZINE 20-37.5 MG tablet Commonly known as: BIDIL Take 1 tablet by mouth 3 (three) times daily.   Lancet Device Misc Use as instructed 3 times daily   lansoprazole 15 MG capsule Commonly known as: PREVACID Take 15 mg by mouth daily.   metoCLOPramide 5 MG tablet Commonly known as: Reglan Take 1 tablet (5 mg total) by mouth 3 (three)  times daily.   Misc. Devices Misc Portable oxygen concentrator.  Diagnosis-chronic respiratory failure.   Misc. Devices Misc Rollaor with seat. Dx: Congestive Heart Failure   polyethylene glycol 17 g packet Commonly known as: MIRALAX / GLYCOLAX Take 17 g by mouth daily. What changed:   when to take this  reasons to take this   Super B Complex/C Caps Take 1 capsule by mouth daily.      Follow-up Information    Jerline Pain, MD Follow up on 03/14/2019.   Specialty: Cardiology Why: at 9:00 AM  Contact information: [address] [address] [phone]        [name], MD Follow up.   Specialty: Family Medicine Contact information: [address] [place] [address] [phone]          Allergies  Allergen Reactions  . Garlic Shortness Of Breath, Itching and Swelling    Hand itching and swelling  . Latex Itching  . Morphine And Related Itching and Other (See Comments)    Headache   . Other Itching    Reaction to newspaper ink -itching and headache    Consultations:  Cardiology and PCCM   Procedures/Studies: Ct Abdomen Pelvis Wo Contrast  Result Date: 01/24/2019 CLINICAL DATA:  63 year old female with history of abdominal distension. Epigastric abdominal pain. EXAM: CT ABDOMEN AND PELVIS WITHOUT CONTRAST TECHNIQUE: Multidetector CT imaging of the abdomen and pelvis was performed following the standard protocol without IV contrast. COMPARISON:  CT the abdomen and pelvis 01/02/2018. FINDINGS: Lower chest: Moderate bilateral pleural effusions with areas of atelectasis and/or consolidation in the lower lobes of the lungs bilaterally. Atherosclerotic calcifications in the thoracic aorta as well as the left main, left anterior descending, left circumflex and right coronary arteries. Calcifications of the aortic valve. Cardiomegaly. Hepatobiliary: No suspicious cystic or solid hepatic lesions are confidently  identified on today's noncontrast CT examination. Status post cholecystectomy. Pancreas: Fatty atrophy in the pancreas. No discrete pancreatic mass, no definite pancreatic mass or peripancreatic fluid collections or inflammatory changes are noted on today's noncontrast CT examination. Spleen: Unremarkable. Adrenals/Urinary Tract: Unenhanced appearance of the kidneys and bilateral adrenal glands is unremarkable. No hydroureteronephrosis. Urinary bladder is unremarkable in appearance. Stomach/Bowel: Unenhanced appearance of the stomach is normal. There is no pathologic dilatation of small bowel or colon. Multiple scattered colonic diverticulae are noted, without surrounding inflammatory changes to suggest an acute diverticulitis at this time. Normal appendix. Vascular/Lymphatic: Aortic atherosclerosis. Retroaortic left renal vein (normal anatomical variant) incidentally noted. No lymphadenopathy identified in the abdomen or pelvis. Reproductive: Unenhanced appearance of the uterus and ovaries is unremarkable. Other: Small umbilical hernia containing  only omental fat. No significant volume of ascites. No pneumoperitoneum. Musculoskeletal: There are no aggressive appearing lytic or blastic lesions noted in the visualized portions of the skeleton. IMPRESSION: 1. No acute findings are noted in the abdomen or pelvis to account for the patient's symptoms. 2. Moderate bilateral pleural effusions with dependent areas of atelectasis and/or consolidation in the lower lobes of the lungs bilaterally. 3. Colonic diverticulosis without evidence of acute diverticulitis at this time. 4. Small umbilical hernia containing only omental fat. No associated bowel incarceration or obstruction at this time. Electronically Signed   By: Vinnie Langton M.D.   On: 01/24/2019 18:09   Dg Chest 1 View  Result Date: 02/03/2019 CLINICAL DATA:  Follow-up possible pneumonia. EXAM: CHEST  1 VIEW COMPARISON:  02/01/2019 FINDINGS: The endotracheal  tube has been removed. The cardiac silhouette remains mildly enlarged. Significantly improved aeration of the left lower lobe with residual airspace opacity with air bronchograms. Mild diffuse accentuation of the interstitial markings with improvement. Minimal diffuse peribronchial thickening. Small left pleural effusion. Thoracic spine degenerative changes. IMPRESSION: 1. Significantly improved left lower lobe pneumonia/atelectasis. 2. Small left pleural effusion. 3. Minimal bronchitic changes. 4. Improved interstitial pulmonary edema with minimal residual interstitial pulmonary edema or chronic interstitial lung disease. 5. Mild cardiomegaly. Electronically Signed   By: Claudie Revering M.D.   On: 02/03/2019 08:10   Dg Chest 1 View  Result Date: 02/01/2019 CLINICAL DATA:  Post intubation EXAM: CHEST  1 VIEW COMPARISON:  Numerous recent priors, most recently 02/01/2019 FINDINGS: Low positioning of the endotracheal tube within the right mainstem bronchus. Recommend retraction 4 cm to the mid trachea. Improving aeration through the left lung. Residual airspace opacity is present in the mid to lower lungs with central air bronchograms. Stable prominence of the cardiac silhouette. No pneumothorax. No acute osseous or soft tissue abnormality. Cholecystectomy clips in the right upper quadrant. IMPRESSION: 1. Low positioning of the endotracheal tube within the right mainstem bronchus. Recommend retraction approximately 4 cm to the mid trachea. 2. Improving aeration through the left lung. Residual airspace opacity in the mid to lower lungs with central air bronchograms, concerning for a superimposed pneumonia These results will be called to the ordering clinician or representative by the Radiologist Assistant, and communication documented in the PACS or zVision Dashboard. Electronically Signed   By: Lovena Le M.D.   On: 02/01/2019 17:34   Dg Chest 1 View  Result Date: 01/30/2019 CLINICAL DATA:  Status post left  thoracentesis. EXAM: CHEST  1 VIEW COMPARISON:  January 29, 2019. FINDINGS: Stable cardiomediastinal silhouette. Increased left upper lobe opacity is noted concerning for pneumonia. Stable left basilar atelectasis and/or pleural effusion is noted. Right lung is clear. No pneumothorax is noted. Bony thorax is unremarkable. IMPRESSION: No pneumothorax is noted. Increased left upper lobe opacity is noted concerning for pneumonia. Stable left basilar atelectasis and pleural effusion is noted. Electronically Signed   By: Marijo Conception M.D.   On: 01/30/2019 12:32   Dg Chest 2 View  Result Date: 02/01/2019 CLINICAL DATA:  Chest pain. EXAM: CHEST - 2 VIEW COMPARISON:  CT 01/31/2019. FINDINGS: Opacification of the left hemithorax again noted consistent with left lung atelectasis. Reference is made to prior CT report. Small right pleural effusion. No pneumothorax. Heart size cannot be evaluated. Degenerative change thoracic spine IMPRESSION: Opacification left hemithorax again noted consistent with left lung atelectasis again noted. No change from prior CT. Reference is made to prior CT report 01/31/2019. Electronically Signed   By:  [place]   On: 02/01/2019 11:42   Ct Head Wo Contrast  Result Date: 01/28/2019 CLINICAL DATA:  63 year old female with altered mental status. EXAM: CT HEAD WITHOUT CONTRAST TECHNIQUE: Contiguous axial images were obtained from the base of the skull through the vertex without intravenous contrast. COMPARISON:  Head CT dated 04/25/2016 FINDINGS: Brain: The ventricles and sulci appropriate size for patient's age. Mild periventricular and deep white matter chronic microvascular ischemic changes noted. There is no acute intracranial hemorrhage. No mass effect or midline shift. No extra-axial fluid collection. Vascular: No hyperdense vessel or unexpected calcification. Skull: Normal. Negative for fracture or focal lesion. Sinuses/Orbits: The visualized paranasal sinuses and the left  mastoid air cells are clear. Right mastoid effusions noted. Other: None IMPRESSION: 1. No acute intracranial hemorrhage. 2. Mild chronic microvascular ischemic changes. 3. Right mastoid effusions. Electronically Signed   By: Anner Crete M.D.   On: 01/28/2019 02:37   Ct Chest Wo Contrast  Result Date: 01/31/2019 CLINICAL DATA:  Shortness of breath. EXAM: CT CHEST WITHOUT CONTRAST TECHNIQUE: Multidetector CT imaging of the chest was performed following the standard protocol without IV contrast. COMPARISON:  Chest x-ray from yesterday. CTA chest dated November 27, 2017. FINDINGS: Cardiovascular: Unchanged mild cardiomegaly. No pericardial effusion. No thoracic aortic aneurysm. Coronary, aortic arch, and branch vessel atherosclerotic vascular disease. Mediastinum/Nodes: No pathologically enlarged mediastinal or axillary lymph nodes. Several prominent mediastinal lymph nodes measuring up to 9 mm in short axis are likely reactive. The thyroid gland, trachea, and esophagus demonstrate no significant findings. Lungs/Pleura: Prominent secretions within the left mainstem bronchus and left upper and lower lobe bronchi with near complete collapse of the left upper lobe and complete collapse of the left lower lobe. The presence of air bronchograms in the left upper and lower lobes is suggestive of superimposed consolidation. Small to moderate bilateral pleural effusions. Dependent atelectasis in the right upper and lower lobes. Scattered mild interlobular septal thickening in the right lung. No pneumothorax. Upper Abdomen: No acute abnormality. Musculoskeletal: No chest wall mass or suspicious bone lesions identified. Mild anasarca. IMPRESSION: 1. Prominent secretions within the left mainstem and left upper and lower lobe bronchi with near complete collapse of the left upper lobe and complete collapse of the left lower lobe. The presence of air bronchograms in the left upper and lower lobes may reflect superimposed  multifocal pneumonia. 2. Small to moderate bilateral pleural effusions. 3. Mild interstitial pulmonary edema. 4. Aortic atherosclerosis (ICD10-I70.0). Electronically Signed   By: Titus Dubin M.D.   On: 01/31/2019 15:51   US Renal  Result Date: 01/28/2019 CLINICAL DATA:  63 year old female with acute renal insufficiency. History of diabetes and hypertension. Bladder surgery. EXAM: RENAL / URINARY TRACT ULTRASOUND COMPLETE COMPARISON:  Renal ultrasound dated 11/29/2017 FINDINGS: Right Kidney: Renal measurements: 11.3 x 5.6 x 5.3 cm = volume: 175 mL. Normal echogenicity. No hydronephrosis or shadowing stone. Left Kidney: Renal measurements: 10.3 x 4.7 x 6.0 cm = volume: 152 mL. Normal echogenicity. No hydronephrosis or shadowing stone. Bladder: The urinary bladder is decompressed around a Foley catheter. Other: None IMPRESSION: Unremarkable renal ultrasound. Electronically Signed   By: Anner Crete M.D.   On: 01/28/2019 19:50   Dg Chest Port 1 View  Result Date: 01/29/2019 CLINICAL DATA:  Dyspnea, shortness of breath, CHF, diabetic EXAM: PORTABLE CHEST 1 VIEW COMPARISON:  Radiograph 01/23/2019 FINDINGS: Veil like density in the left lung base suggesting a layering pleural effusion on a background of hazy interstitial opacities and cephalized, indistinct pulmonary  vascularity with central vascular cuffing and septal lines. Prominence the cardiac silhouette is similar to comparison exams. No visible pneumothorax. Degenerative changes are present in the imaged spine and shoulders. No acute osseous or soft tissue abnormality. IMPRESSION: 1. Findings consistent with mild to moderate CHF with interstitial edema. Suspect a moderate layering pleural effusion on the left. 2. Unchanged prominent cardiac silhouette, which could reflect cardiomegaly or pericardial effusion. Electronically Signed   By: Lovena Le M.D.   On: 01/29/2019 17:35   Dg Chest Port 1 View  Result Date: 01/23/2019 CLINICAL DATA:   63 year old female with shortness of breath. On home oxygen. EXAM: PORTABLE CHEST 1 VIEW COMPARISON:  10/31/2018 and earlier. FINDINGS: Portable AP semi upright view at 1509 hours. Stable lung volumes and mediastinal contours. Visualized tracheal air column is within normal limits. Further increased bilateral interstitial and lung base opacity, with obscuration of the diaphragm now. No superimposed pneumothorax. No definite air bronchograms. Paucity bowel gas in the upper abdomen. No acute osseous abnormality identified. IMPRESSION: Increasing bilateral lung opacity favored due to acute pulmonary edema with lung base atelectasis and possibly small pleural effusions. Bilateral lower lobe pneumonia felt less likely. Electronically Signed   By: Genevie Ann M.D.   On: 01/23/2019 15:18   US Thoracentesis Asp Pleural Space W/img Guide  Result Date: 01/30/2019 INDICATION: Patient with history of CHF, chronic kidney disease, bilateral pleural effusions. Request received for diagnostic and therapeutic left thoracentesis. EXAM: ULTRASOUND GUIDED DIAGNOSTIC AND THERAPEUTIC LEFT THORACENTESIS MEDICATIONS: None COMPLICATIONS: None immediate. PROCEDURE: An ultrasound guided thoracentesis was thoroughly discussed with the patient and questions answered. The benefits, risks, alternatives and complications were also discussed. The patient understands and wishes to proceed with the procedure. Written consent was obtained. Ultrasound was performed to localize and mark an adequate pocket of fluid in the left chest. The area was then prepped and draped in the normal sterile fashion. 1% Lidocaine was used for local anesthesia. Under ultrasound guidance a 6 Fr Safe-T-Centesis catheter was introduced. Thoracentesis was performed. The catheter was removed and a dressing applied. FINDINGS: A total of approximately 270 cc of light yellow fluid was removed. Samples were sent to the laboratory as requested by the clinical team. IMPRESSION:  Successful ultrasound guided diagnostic and therapeutic left thoracentesis yielding 270 cc of pleural fluid. Read by: Rowe Robert, PA-C Electronically Signed   By: Jacqulynn Cadet M.D.   On: 01/30/2019 12:19   (Echo, Carotid, EGD, Colonoscopy, ERCP)    Subjective: Resting in bed denies chest pain or shortness of breath does complain of constipation  Discharge Exam: Vitals:   02/07/19 0700 02/07/19 0800  BP: (!) 154/73   Pulse: 83 88  Resp: 15 19  Temp:  98.2 F (36.8 C)  SpO2: 96% 96%   Vitals:   02/07/19 0345 02/07/19 0400 02/07/19 0700 02/07/19 0800  BP:  (!) 162/72 (!) 154/73   Pulse:  85 83 88  Resp:  _0 Temp:    98.2 F (36.8 C)  TempSrc:    Oral  SpO2:  92% 96% 96%  Weight: 77.1 kg     Height:        General: Pt is alert, awake, not in acute distress Cardiovascular: RRR, S1/S2 +, no rubs, no gallops Respiratory: CTA bilaterally, no wheezing, no rhonchi Abdominal: Soft, NT, ND, bowel sounds + Extremities: no edema, no cyanosis    The results of significant diagnostics from this hospitalization (including imaging, microbiology, ancillary and laboratory) are listed below for  reference.     Microbiology: Recent Results (from the past 240 hour(s))  Culture, body fluid-bottle     Status: None   Collection Time: 01/30/19 12:38 PM   Specimen: Pleura  Result Value Ref Range Status   Specimen Description PLEURAL LEFT  Final   Special Requests NONE  Final   Culture   Final    NO GROWTH 5 DAYS Performed at [facility], [address], [place], [place] [address]    Report Status 02/04/2019 FINAL  Final  Gram stain     Status: None   Collection Time: 01/30/19 12:38 PM   Specimen: Pleura  Result Value Ref Range Status   Specimen Description PLEURAL LEFT  Final   Special Requests NONE  Final   Gram Stain   Final    MODERATE WBC PRESENT, PREDOMINANTLY MONONUCLEAR NO ORGANISMS SEEN Performed at [facility], [place] [address], [place],  [place] [address]    Report Status 01/30/2019 FINAL  Final  Culture, respiratory (non-expectorated)     Status: None   Collection Time: 02/01/19  3:43 PM   Specimen: Bronchoalveolar Lavage; Respiratory  Result Value Ref Range Status   Specimen Description   Final    BRONCHIAL ALVEOLAR LAVAGE Performed at [facility], [place] [address], [place], [place] [address]    Special Requests   Final    NONE Performed at [facility], [place] [address] [place], [place] [address]    Gram Stain   Final    ABUNDANT WBC PRESENT,BOTH PMN AND MONONUCLEAR RARE GRAM POSITIVE COCCI    Culture   Final    Consistent with normal respiratory flora. Performed at [facility], [place] [address], [place], [place] [address]    Report Status 02/04/2019 FINAL  Final  MRSA PCR Screening     Status: None   Collection Time: 02/01/19  4:00 PM   Specimen: Nasal Mucosa; Nasopharyngeal  Result Value Ref Range Status   MRSA by PCR NEGATIVE NEGATIVE Final    Comment:        The GeneXpert MRSA Assay (FDA approved for NASAL specimens only), is one component of a comprehensive MRSA colonization surveillance program. It is not intended to diagnose MRSA infection nor to guide or monitor treatment for MRSA infections. Performed at [facility], [place] [address] [place], [place] [address]      Labs: BNP (last 3 results) Recent Labs    10/31/18 1513 01/09/19 0959 01/23/19 1343  BNP 422.8* 539.0* 295.2*   Basic Metabolic Panel: Recent Labs  Lab 02/03/19 0225 02/04/19 0251 02/05/19 0203 02/05/19 1659 02/06/19 0156 02/07/19 0156  NA 138 137  137 137 137 135  137 137  K 3.8 3.7  3.7 3.9 4.2 4.0  4.1 4.1  CL 99 98  98 100 99 98  99 100  CO2 _0 GLUCOSE 111* 131*  131* 140* 211* 172*  176* 166*  BUN 34* 36*  36* 41* 50* 54*  48* 46*  CREATININE 1.69* 1.93*  1.98* 1.98* 2.16* 2.12*  2.03* 1.65*  CALCIUM 8.8* 8.9   8.9 9.0 9.2 8.7*  8.8* 9.0  MG 1.8 1.8 1.7  --  1.8 1.7  PHOS  --  3.6 3.5  --  3.1  --    Liver Function Tests: Recent Labs  Lab 02/04/19 0251 02/05/19 0203 02/06/19 0156  ALBUMIN 2.6* 2.6* 2.7*  No results for input(s): LIPASE, AMYLASE in the last 168 hours. No results for input(s): AMMONIA in the last 168 hours. CBC: Recent Labs  Lab 02/01/19 0413 02/02/19 0202 02/03/19 0225 02/04/19 0251 02/05/19 0203  WBC 4.6 5.8 5.3 4.7 4.4  NEUTROABS 3.5 4.4 3.9 3.3 3.0  HGB 9.1* 9.5* 8.8* 9.0* 9.0*  HCT 30.0* 31.7* 30.3* 30.4* 30.8*  MCV 90.6 90.3 92.4 91.8 92.5  PLT 240 277 266 253 259   Cardiac Enzymes: No results for input(s): CKTOTAL, CKMB, CKMBINDEX, TROPONINI in the last 168 hours. BNP: Invalid input(s): POCBNP CBG: Recent Labs  Lab 02/06/19 0833 02/06/19 1229 02/06/19 1620 02/06/19 2127 02/07/19 0825  GLUCAP 157* 239* 150* 133* 134*   D-Dimer No results for input(s): DDIMER in the last 72 hours. Hgb A1c No results for input(s): HGBA1C in the last 72 hours. Lipid Profile No results for input(s): CHOL, HDL, LDLCALC, TRIG, CHOLHDL, LDLDIRECT in the last 72 hours. Thyroid function studies No results for input(s): TSH, T4TOTAL, T3FREE, THYROIDAB in the last 72 hours.  Invalid input(s): FREET3 Anemia work up No results for input(s): VITAMINB12, FOLATE, FERRITIN, TIBC, IRON, RETICCTPCT in the last 72 hours. Urinalysis    Component Value Date/Time   COLORURINE YELLOW 01/28/2019 0714   APPEARANCEUR TURBID (A) 01/28/2019 0714   LABSPEC 1.015 01/28/2019 0714   PHURINE 5.0 01/28/2019 0714   GLUCOSEU 50 (A) 01/28/2019 0714   HGBUR NEGATIVE 01/28/2019 0714   BILIRUBINUR NEGATIVE 01/28/2019 0714   BILIRUBINUR negative 12/13/2017 1514   KETONESUR NEGATIVE 01/28/2019 0714   PROTEINUR >=300 (A) 01/28/2019 0714   UROBILINOGEN 0.2 12/13/2017 1514   NITRITE NEGATIVE 01/28/2019 0714   LEUKOCYTESUR MODERATE (A) 01/28/2019 0714   Sepsis Labs Invalid input(s):  PROCALCITONIN,  WBC,  LACTICIDVEN Microbiology Recent Results (from the past 240 hour(s))  Culture, body fluid-bottle     Status: None   Collection Time: 01/30/19 12:38 PM   Specimen: Pleura  Result Value Ref Range Status   Specimen Description PLEURAL LEFT  Final   Special Requests NONE  Final   Culture   Final    NO GROWTH 5 DAYS Performed at [facility], [address] [place], [place] [address]    Report Status 02/04/2019 FINAL  Final  Gram stain     Status: None   Collection Time: 01/30/19 12:38 PM   Specimen: Pleura  Result Value Ref Range Status   Specimen Description PLEURAL LEFT  Final   Special Requests NONE  Final   Gram Stain   Final    MODERATE WBC PRESENT, PREDOMINANTLY MONONUCLEAR NO ORGANISMS SEEN Performed at [facility], [place] [address] [place], [place] [address]    Report Status 01/30/2019 FINAL  Final  Culture, respiratory (non-expectorated)     Status: None   Collection Time: 02/01/19  3:43 PM   Specimen: Bronchoalveolar Lavage; Respiratory  Result Value Ref Range Status   Specimen Description   Final    BRONCHIAL ALVEOLAR LAVAGE Performed at [facility], [place] [address] [place], [place] [address]    Special Requests   Final    NONE Performed at [facility], [place] [address], [place], [place] [address]    Gram Stain   Final    ABUNDANT WBC PRESENT,BOTH PMN AND MONONUCLEAR RARE GRAM POSITIVE COCCI    Culture   Final    Consistent with normal respiratory flora. Performed at [facility], [place] [address] [place],  [address]  Report Status 02/04/2019 FINAL  Final  MRSA PCR Screening     Status: None   Collection Time: 02/01/19  4:00 PM   Specimen: Nasal Mucosa; Nasopharyngeal  Result Value Ref Range Status   MRSA by PCR NEGATIVE NEGATIVE Final    Comment:        The GeneXpert MRSA Assay (FDA approved for NASAL specimens only), is one component of a comprehensive MRSA  colonization surveillance program. It is not intended to diagnose MRSA infection nor to guide or monitor treatment for MRSA infections. Performed at [facility], [place] [address] [place], [place] [address]      Time coordinating discharge: 37 minutes  SIGNED:   Georgette Shell, MD  [facility] 02/07/2019, 10:05 AM Pager   If 7PM-7AM, please contact night-coverage [email or web address] Password [email or web address]

## 2019-02-08 ENCOUNTER — Emergency Department (HOSPITAL_COMMUNITY): Payer: Medicare Other

## 2019-02-08 ENCOUNTER — Encounter (HOSPITAL_COMMUNITY): Payer: Self-pay | Admitting: Emergency Medicine

## 2019-02-08 ENCOUNTER — Inpatient Hospital Stay (HOSPITAL_COMMUNITY)
Admission: EM | Admit: 2019-02-08 | Discharge: 2019-02-19 | DRG: 280 | Disposition: A | Discharge status: Home Health | Payer: Medicare Other | Attending: Internal Medicine | Admitting: Internal Medicine

## 2019-02-08 ENCOUNTER — Telehealth: Payer: Self-pay | Admitting: Family Medicine

## 2019-02-08 ENCOUNTER — Other Ambulatory Visit: Payer: Self-pay

## 2019-02-08 DIAGNOSIS — R0902 Hypoxemia: Secondary | ICD-10-CM

## 2019-02-08 DIAGNOSIS — E1122 Type 2 diabetes mellitus with diabetic chronic kidney disease: Secondary | ICD-10-CM | POA: Diagnosis present

## 2019-02-08 DIAGNOSIS — E7800 Pure hypercholesterolemia, unspecified: Secondary | ICD-10-CM | POA: Diagnosis present

## 2019-02-08 DIAGNOSIS — Z885 Allergy status to narcotic agent status: Secondary | ICD-10-CM

## 2019-02-08 DIAGNOSIS — Z794 Long term (current) use of insulin: Secondary | ICD-10-CM

## 2019-02-08 DIAGNOSIS — Z91018 Allergy to other foods: Secondary | ICD-10-CM

## 2019-02-08 DIAGNOSIS — Z8673 Personal history of transient ischemic attack (TIA), and cerebral infarction without residual deficits: Secondary | ICD-10-CM

## 2019-02-08 DIAGNOSIS — Z833 Family history of diabetes mellitus: Secondary | ICD-10-CM

## 2019-02-08 DIAGNOSIS — Z823 Family history of stroke: Secondary | ICD-10-CM

## 2019-02-08 DIAGNOSIS — I5043 Acute on chronic combined systolic (congestive) and diastolic (congestive) heart failure: Secondary | ICD-10-CM | POA: Diagnosis not present

## 2019-02-08 DIAGNOSIS — I509 Heart failure, unspecified: Secondary | ICD-10-CM | POA: Diagnosis not present

## 2019-02-08 DIAGNOSIS — Z20828 Contact with and (suspected) exposure to other viral communicable diseases: Secondary | ICD-10-CM | POA: Diagnosis present

## 2019-02-08 DIAGNOSIS — E785 Hyperlipidemia, unspecified: Secondary | ICD-10-CM | POA: Diagnosis present

## 2019-02-08 DIAGNOSIS — I214 Non-ST elevation (NSTEMI) myocardial infarction: Secondary | ICD-10-CM | POA: Diagnosis present

## 2019-02-08 DIAGNOSIS — I428 Other cardiomyopathies: Secondary | ICD-10-CM | POA: Diagnosis not present

## 2019-02-08 DIAGNOSIS — R7989 Other specified abnormal findings of blood chemistry: Secondary | ICD-10-CM | POA: Diagnosis not present

## 2019-02-08 DIAGNOSIS — N179 Acute kidney failure, unspecified: Secondary | ICD-10-CM

## 2019-02-08 DIAGNOSIS — D631 Anemia in chronic kidney disease: Secondary | ICD-10-CM | POA: Diagnosis present

## 2019-02-08 DIAGNOSIS — R0600 Dyspnea, unspecified: Secondary | ICD-10-CM

## 2019-02-08 DIAGNOSIS — R0602 Shortness of breath: Secondary | ICD-10-CM

## 2019-02-08 DIAGNOSIS — I130 Hypertensive heart and chronic kidney disease with heart failure and stage 1 through stage 4 chronic kidney disease, or unspecified chronic kidney disease: Principal | ICD-10-CM | POA: Diagnosis present

## 2019-02-08 DIAGNOSIS — I081 Rheumatic disorders of both mitral and tricuspid valves: Secondary | ICD-10-CM | POA: Diagnosis present

## 2019-02-08 DIAGNOSIS — R600 Localized edema: Secondary | ICD-10-CM | POA: Diagnosis not present

## 2019-02-08 DIAGNOSIS — R000 Tachycardia, unspecified: Secondary | ICD-10-CM | POA: Diagnosis not present

## 2019-02-08 DIAGNOSIS — Z8619 Personal history of other infectious and parasitic diseases: Secondary | ICD-10-CM

## 2019-02-08 DIAGNOSIS — E1140 Type 2 diabetes mellitus with diabetic neuropathy, unspecified: Secondary | ICD-10-CM

## 2019-02-08 DIAGNOSIS — I2510 Atherosclerotic heart disease of native coronary artery without angina pectoris: Secondary | ICD-10-CM | POA: Diagnosis present

## 2019-02-08 DIAGNOSIS — I110 Hypertensive heart disease with heart failure: Secondary | ICD-10-CM | POA: Diagnosis not present

## 2019-02-08 DIAGNOSIS — M109 Gout, unspecified: Secondary | ICD-10-CM | POA: Diagnosis present

## 2019-02-08 DIAGNOSIS — R0689 Other abnormalities of breathing: Secondary | ICD-10-CM | POA: Diagnosis not present

## 2019-02-08 DIAGNOSIS — J9621 Acute and chronic respiratory failure with hypoxia: Secondary | ICD-10-CM | POA: Diagnosis not present

## 2019-02-08 DIAGNOSIS — N183 Chronic kidney disease, stage 3 unspecified: Secondary | ICD-10-CM | POA: Diagnosis present

## 2019-02-08 DIAGNOSIS — R778 Other specified abnormalities of plasma proteins: Secondary | ICD-10-CM

## 2019-02-08 DIAGNOSIS — Z9981 Dependence on supplemental oxygen: Secondary | ICD-10-CM

## 2019-02-08 DIAGNOSIS — J80 Acute respiratory distress syndrome: Secondary | ICD-10-CM | POA: Diagnosis not present

## 2019-02-08 DIAGNOSIS — J9601 Acute respiratory failure with hypoxia: Secondary | ICD-10-CM | POA: Diagnosis present

## 2019-02-08 DIAGNOSIS — Z7982 Long term (current) use of aspirin: Secondary | ICD-10-CM

## 2019-02-08 DIAGNOSIS — I493 Ventricular premature depolarization: Secondary | ICD-10-CM | POA: Diagnosis not present

## 2019-02-08 DIAGNOSIS — Z91040 Latex allergy status: Secondary | ICD-10-CM

## 2019-02-08 DIAGNOSIS — E11649 Type 2 diabetes mellitus with hypoglycemia without coma: Secondary | ICD-10-CM | POA: Diagnosis not present

## 2019-02-08 DIAGNOSIS — I252 Old myocardial infarction: Secondary | ICD-10-CM

## 2019-02-08 DIAGNOSIS — E1165 Type 2 diabetes mellitus with hyperglycemia: Secondary | ICD-10-CM | POA: Diagnosis present

## 2019-02-08 MED ORDER — NITROGLYCERIN IN D5W 200-5 MCG/ML-% IV SOLN
0.0000 ug/min | INTRAVENOUS | Status: DC
  Administered 2019-02-08: 5 ug/min via INTRAVENOUS
  Administered 2019-02-09: 25 ug/min via INTRAVENOUS
  Administered 2019-02-10: 15 ug/min via INTRAVENOUS
  Filled 2019-02-08 (×3): qty 250

## 2019-02-08 MED ORDER — FUROSEMIDE 10 MG/ML IJ SOLN
40.0000 mg | Freq: Once | INTRAMUSCULAR | Status: AC
  Administered 2019-02-08: 40 mg via INTRAVENOUS
  Filled 2019-02-08: qty 4

## 2019-02-08 NOTE — ED Notes (Signed)
Per Tomi Bamberger Md, with chest pain resolution and SBP in 140's leave Nitroglycerin at 25 mcg

## 2019-02-08 NOTE — ED Triage Notes (Signed)
Patient presents in respiratory distress. While at home, she thought she was getting oxygen, but the line was tied of so she wasn't receiving any oxygen. EMS found her O2 sat to be in the mid 80's on room air. She has had SOB that has been getting worse since she was discharged yesterday. The patient had been in the hospital for 2 weeks due to CHF. She has also complained of a cough and chest discomfort. EMS reports rhonchi lung sounds.   EMS vitals: 180/100 BP 140 HR 96% O2 sat on a non-rebreather

## 2019-02-08 NOTE — ED Provider Notes (Addendum)
[facility] DEPT Provider Note   CSN: [id number] Arrival date & time: 02/08/19  2109     History   Chief Complaint Chief Complaint  Patient presents with  . Shortness of Breath    HPI Kara Hanson is a 63 y.o. female history of congestive heart failure, recent echo EF 40 to 45%, coronary artery disease, prior MI, CKD 3, hypertension, HLD, DM, CVA.  Recently hospitalized for CHF exacerbation discharged yesterday.     HPI  Presents for SOB that began at rest this afternoon and progressively worsened. Endorses chest pressure, pain and fatigue.   States that she had shortness of breath began at rest approximately noon today and progressively worsened.  Patient states that she has is on 2 L of oxygen at home.  States that she was feeling in her normal state of health after discharge and until noon today.  States she now has some bilateral jaw pain that she describes as sharp and burning, chest pressure and chest pain.  Patient states that symptoms all began shortly after dyspnea.  Patient has history of MI and CHF exacerbations but states that this feels different from either.  Denies nausea or vomiting.  Endorses fatigue.  She was just discharged yesterday for CHF exacerbation and told to restart lasix Sunday d/t kidney issue.  On chart review it appears that patient was instructed to restart Lasix on Sunday due to AKI which progressively improved with administration.  Patient was discharged in good health normotensive with normal pulse.  Patient denies nausea, vomiting, dizziness.  Tested negative for Covid 2 weeks ago.  Past Medical History:  Diagnosis Date  . CHF (congestive heart failure) ([place])   . CVA (cerebral vascular accident) ([place])   . Diabetes mellitus without complication ([place])   . Hypercholesteremia   . Hypertension   . Myocardial infarction ([place])   . Pneumonia 11/01/2018  . Spinal stenosis     Patient Active Problem List   Diagnosis  Date Noted  . S/P thoracentesis   . Atelectasis   . Acute on chronic respiratory failure with hypoxia ([place])   . Acute renal failure superimposed on stage 3 chronic kidney disease ([place])   . Epigastric pain   . CHF (congestive heart failure) ([place]) 01/23/2019  . CAP (community acquired pneumonia) 10/31/2018  . Acute CHF (congestive heart failure) ([place]) 09/24/2018  . SIRS (systemic inflammatory response syndrome) ([place]) 07/25/2018  . COVID-19 virus infection 07/20/2018  . Chronic diastolic CHF (congestive heart failure) ([place]) 07/20/2018  . Pneumonia due to COVID-19 virus 07/20/2018  . Vitamin D deficiency 04/02/2018  . Spinal stenosis 03/29/2018  . Acute and chronic respiratory failure with hypoxia ([place]) 03/20/2018  . Controlled type 2 diabetes with neuropathy ([place]) 03/20/2018  . CKD (chronic kidney disease), stage III 03/20/2018  . Acute on chronic combined systolic and diastolic CHF (congestive heart failure) ([place]) 11/27/2017  . AKI (acute kidney injury) ([place]) 11/27/2017  . Acute respiratory distress 11/27/2017  . Bulging lumbar disc 11/14/2017  . Gout 11/14/2017  . Hyperlipidemia LDL goal <70 09/30/2016  . Chest pain 06/17/2016  . Stress-induced cardiomyopathy 05/19/2016  . Type 2 diabetes mellitus with diabetic neuropathy, unspecified ([place]) 05/06/2016  . Vertigo 05/06/2016  . Controlled type 2 diabetes mellitus with hyperglycemia ([place]) 04/23/2016  . Hypertension 04/23/2016  . Normochromic normocytic anemia 04/23/2016  . Syncope 04/23/2016    Past Surgical History:  Procedure Laterality Date  . BIOPSY  01/27/2019   Procedure: BIOPSY;  Surgeon: Otis Brace, MD;  Location: [facility];  Service: Gastroenterology;;  . BLADDER SURGERY    . CARDIAC CATHETERIZATION N/A 04/25/2016   Procedure: Left Heart Cath and Coronary Angiography;  Surgeon: Lorretta Harp, MD;  Location: [facility];  Service: Cardiovascular;  Laterality: N/A;  . CESAREAN SECTION    .  CHOLECYSTECTOMY    . ESOPHAGOGASTRODUODENOSCOPY (EGD) WITH PROPOFOL N/A 01/27/2019   Procedure: ESOPHAGOGASTRODUODENOSCOPY (EGD) WITH PROPOFOL;  Surgeon: Otis Brace, MD;  Location: [facility];  Service: Gastroenterology;  Laterality: N/A;  . VIDEO BRONCHOSCOPY N/A 02/01/2019   Procedure: VIDEO BRONCHOSCOPY WITHOUT FLUORO;  Surgeon: Candee Furbish, MD;  Location: [facility];  Service: Endoscopy;  Laterality: N/A;     OB History   No obstetric history on file.      Home Medications    Prior to Admission medications   Medication Sig Start Date End Date Taking? Authorizing Provider  Accu-Chek FastClix Lancets MISC USE AS DIRECTED TO TEST BLOOD SUGAR THREE TIMES DAILY 07/31/18   [name], MD  allopurinol (ZYLOPRIM) 300 MG tablet Take 0.5 tablets (150 mg total) by mouth daily. 02/06/19 03/08/19  Bonnell Public, MD  Amino Acids-Protein Hydrolys (FEEDING SUPPLEMENT, PRO-STAT SUGAR FREE 64,) LIQD Take 30 mLs by mouth 2 (two) times daily. 02/05/19   Dana Allan I, MD  aspirin EC 81 MG tablet Take 81 mg by mouth daily.    [provider]  atorvastatin (LIPITOR) 80 MG tablet Take 1 tablet (80 mg total) by mouth daily at 6 PM. 01/08/19 02/07/19  [name], MD  bisacodyl (DULCOLAX) 5 MG EC tablet Take 2 tablets (10 mg total) by mouth daily for 4 days. 02/07/19 02/11/19  Georgette Shell, MD  Blood Glucose Monitoring Suppl (ACCU-CHEK GUIDE) w/Device KIT 1 each by Does not apply route 3 (three) times daily. 07/31/18   [name], MD  carvedilol (COREG) 25 MG tablet Take 1 tablet (25 mg total) by mouth 2 (two) times daily with a meal. 02/05/19   Bonnell Public, MD  DULoxetine (CYMBALTA) 30 MG capsule Take 1 capsule (30 mg total) by mouth daily. 02/06/19   Bonnell Public, MD  EASY COMFORT PEN NEEDLES 31G X 5 MM MISC USE FOUR TIMES PER DAY FOR INSULIN ADMINISTRATION Patient taking differently: 4 (four) times daily.  10/02/18   [name], MD   ergocalciferol (DRISDOL) 1.25 MG (50000 UT) capsule Take 1 capsule (50,000 Units total) by mouth once a week for 4 doses. 02/05/19 02/27/19  Bonnell Public, MD  feeding supplement, ENSURE ENLIVE, (ENSURE ENLIVE) LIQD Take 237 mLs by mouth 3 (three) times daily between meals. 02/05/19   Bonnell Public, MD  furosemide (LASIX) 40 MG tablet Take 1 tablet (40 mg total) by mouth daily. Start 11/8 02/07/19 03/09/19  Georgette Shell, MD  gabapentin (NEURONTIN) 100 MG capsule Take 1 capsule (100 mg total) by mouth 2 (two) times daily. 02/05/19   Dana Allan I, MD  glucose blood (ACCU-CHEK GUIDE) test strip USE AS DIRECTED TO TEST BLOOD SUGAR THREE TIMES DAILY 07/31/18   [name], MD  insulin aspart (NOVOLOG) 100 UNIT/ML injection 0 to 12 units subcutaneously 3 times daily before meals as per sliding scale 03/29/18   [name], MD  Insulin Glargine (BASAGLAR KWIKPEN) 100 UNIT/ML SOPN Inject 0.05 mLs (5 Units total) into the skin at bedtime. Patient taking differently: Inject 35 Units into the skin at bedtime.  10/02/18   Bonnielee Haff, MD  isosorbide-hydrALAZINE (BIDIL) 20-37.5 MG tablet Take 1  tablet by mouth 3 (three) times daily. 02/05/19   Bonnell Public, MD  Lancet Device MISC Use as instructed 3 times daily 09/19/17   Rodell Perna A, PA-C  lansoprazole (PREVACID) 15 MG capsule Take 15 mg by mouth daily.  10/02/18   [provider]  metoCLOPramide (REGLAN) 5 MG tablet Take 1 tablet (5 mg total) by mouth 3 (three) times daily. 02/05/19 02/05/20  Bonnell Public, MD  Misc. Devices MISC Portable oxygen concentrator.  Diagnosis-chronic respiratory failure. 09/03/18   [name], MD  Misc. Devices MISC Rollaor with seat. Dx: Congestive Heart Failure 10/19/18   [name], MD  polyethylene glycol (MIRALAX / GLYCOLAX) 17 g packet Take 17 g by mouth daily. Patient taking differently: Take 17 g by mouth daily as needed for moderate constipation.  10/02/18   Bonnielee Haff, MD  SUPER B COMPLEX/C CAPS Take 1 capsule by mouth daily.    [provider]    Family History Family History  Problem Relation Age of Onset  . Diabetes Mellitus II Father   . Stroke Father   . Healthy Mother        She is 75 years old.     Social History Social History   Tobacco Use  . Smoking status: Never Smoker  . Smokeless tobacco: Never Used  Substance Use Topics  . Alcohol use: No  . Drug use: No     Allergies   Garlic, Latex, Morphine and related, and Other   Review of Systems Review of Systems  Constitutional: Negative for fever.  HENT: Negative for congestion.   Respiratory: Positive for chest tightness and shortness of breath.   Cardiovascular: Positive for chest pain. Negative for palpitations and leg swelling.  Gastrointestinal: Negative for abdominal pain, diarrhea, nausea and vomiting.  Genitourinary: Negative for dysuria and hematuria.  Musculoskeletal: Negative for myalgias.       Jaw pain  Skin: Negative for rash.  Neurological: Negative for dizziness and headaches.  Psychiatric/Behavioral: Negative for agitation.     Physical Exam Updated Vital Signs BP (!) 144/73   Pulse 93   Temp 98.8 F (37.1 C) (Oral)   Resp (!) 30   SpO2 95%   Physical Exam Vitals signs and nursing note reviewed.  Constitutional:      General: She is in acute distress.     Comments: Patient is in acute distress with increased work of breathing.  Able to follow commands and answer questions.  HENT:     Head: Normocephalic and atraumatic.     Nose: Nose normal.     Mouth/Throat:     Mouth: Mucous membranes are moist.  Eyes:     General: No scleral icterus.    Extraocular Movements: Extraocular movements intact.  Neck:     Musculoskeletal: Normal range of motion.  Cardiovascular:     Rate and Rhythm: Regular rhythm. Tachycardia present.     Pulses: Normal pulses.     Heart sounds: Normal heart sounds.  Pulmonary:     Breath sounds: Rales  (Throughout) present.     Comments: Increased work of breathing, no retractions, no nasal flaring, increased rate of breathing.  Patient able to speak in full sentences but appears dyspneic when doing so. Abdominal:     Palpations: Abdomen is soft.     Tenderness: There is no abdominal tenderness.  Musculoskeletal:     Right lower leg: Edema present.     Left lower leg: Edema present.  Skin:  General: Skin is warm and dry.     Capillary Refill: Capillary refill takes less than 2 seconds.     Comments: Non-diaphoretic  Neurological:     Mental Status: She is alert. Mental status is at baseline.     Comments: No focal neuro deficit.  No facial droop.  Sensation intact to the face.  Moves all 4 extremities.  Psychiatric:        Mood and Affect: Mood normal.        Behavior: Behavior normal.      ED Treatments / Results  Labs (all labs ordered are listed, but only abnormal results are displayed) Labs Reviewed  BRAIN NATRIURETIC PEPTIDE - Abnormal; Notable for the following components:      Result Value   B Natriuretic Peptide 380.8 (*)    All other components within normal limits  BASIC METABOLIC PANEL - Abnormal; Notable for the following components:   Glucose, Bld 222 (*)    BUN 39 (*)    Creatinine, Ser 1.41 (*)    GFR calc non Af Amer 39 (*)    GFR calc Af Amer 46 (*)    All other components within normal limits  CBC WITH DIFFERENTIAL/PLATELET - Abnormal; Notable for the following components:   Hemoglobin 11.6 (*)    Neutro Abs 8.8 (*)    Lymphs Abs 0.4 (*)    All other components within normal limits  TROPONIN I (HIGH SENSITIVITY) - Abnormal; Notable for the following components:   Troponin I (High Sensitivity) 25 (*)    All other components within normal limits  SARS CORONAVIRUS 2 (TAT 6-24 HRS)  TROPONIN I (HIGH SENSITIVITY)   ED ECG REPORT Initial EKG  Date: 02/09/2019  Rate: 139  Rhythm: sinus tachycardia  QRS Axis: normal  Intervals: normal  ST/T Wave  abnormalities: nonspecific T wave changes  Conduction Disutrbances:none  Narrative Interpretation:   Old EKG Reviewed: changes noted faster rate  I have personally reviewed the EKG tracing and agree with the computerized printout as noted.    EKG EKG Interpretation  Date/Time:  Friday February 08 2019 23:54:40 EST Ventricular Rate:  91 PR Interval:    QRS Duration: 91 QT Interval:  336 QTC Calculation: 414 R Axis:   46 Text Interpretation: Sinus rhythm Ventricular premature complex Borderline repolarization abnormality Rate has improved compared to prior Confirmed by Pryor Curia [phone]) on 02/09/2019 12:06:32 AM  Radiology Xr Chest Portable  Result Date: 02/08/2019 CLINICAL DATA:  Shortness of breath EXAM: PORTABLE CHEST 1 VIEW COMPARISON:  February 03, 2019 FINDINGS: Heart size is enlarged. There are small to moderate-sized bilateral pleural effusions, right greater than left. There are diffuse hazy bilateral airspace opacities with areas of more focal consolidation/atelectasis at the lung bases. No pneumothorax. No acute osseous abnormality. IMPRESSION: Overall findings concerning for congestive heart failure and pulmonary edema as detailed above. Electronically Signed   By: Constance Holster M.D.   On: 02/08/2019 21:55    Procedures Procedures (including critical care time)  CRITICAL CARE Performed by: Tedd Sias   Total critical care time: 35 minutes  Critical care time was exclusive of separately billable procedures and treating other patients.  Critical care was necessary to treat or prevent imminent or life-threatening deterioration.  Critical care was time spent personally by me on the following activities: development of treatment plan with patient and/or surrogate as well as nursing, discussions with consultants, evaluation of patient's response to treatment, examination of patient, obtaining history from patient or  surrogate, ordering and performing treatments  and interventions, ordering and review of laboratory studies, ordering and review of radiographic studies, pulse oximetry and re-evaluation of patient's condition.   Medications Ordered in ED Medications  nitroGLYCERIN 50 mg in dextrose 5 % 250 mL (0.2 mg/mL) infusion (25 mcg/min Intravenous Rate/Dose Change 02/08/19 2324)  furosemide (LASIX) injection 40 mg (40 mg Intravenous Given 02/08/19 2251)     Initial Impression / Assessment and Plan / ED Course  I have reviewed the triage vital signs and the nursing notes.  Pertinent labs & imaging results that were available during my care of the patient were reviewed by me and considered in my medical decision making (see chart for details).        Patient is 63 year old female with history of CHF discharge yesterday for CHF exacerbation.  Patient presents for shortness of breath, chest pain/pressure, jaw pain, fatigue.   PE notable for increased work of breathing and crackles diffusely bilateral lower bases.   Because of patient's history of CHF and current presentation concern for CHF exacerbation or acute coronary syndrome.   Ordered: Troponin x2, EKG x2, CBC, BMP, BNP, chest x-ray  Chest x-ray shows cardiomegaly and pulmonary edema consistent with CHF exacerbation.  Patient SPO2 WNL nonrebreather mask with 15 L/min but drops quickly to 80s when attempt at weaning. Patient is hypertensive and tachycardic.  Started on nitroglycerin drip, 40 mg IV Lasix.  Tachycardia improved, repeat EKG shows sinus rhythm rate WNLs--personally interpreted and reviewed by me. No evidence of ischemia on EKG. Will repeat.  Reassessment of patient with decreased work of breathing, pulse now 90, BP WNL. Patient states CP is improved. Still has some jaw pain.   BNP elevated however lower than value was at patient's prior CHF exacerbation presentation.  Troponin initial is 25; delta troponin ordered.  Creatinine and BUN elevated but mildly improved from  yesterday's lab work.   I discussed this case with my attending physician who cosigned this note including patient's presenting symptoms, physical exam, and planned diagnostics and interventions. Attending physician stated agreement with plan or made changes to plan which were implemented.   Attending physician assessed patient at bedside.   Covid pending.  I personally reviewed the EMR; most recent echo shows EF 40-50% and heart cath in 2018 was without obstruction.   Patient care transferred to [facility] at shift change. Patient will likely be admitted once lab work returns. Troponin x2 pending.  Final Clinical Impressions(s) / ED Diagnoses   Final diagnoses:  Acute on chronic congestive heart failure, unspecified heart failure type [facility])  Hypoxia    ED Discharge Orders    None       Tedd Sias, [place] 02/09/19 0131    Tedd Sias, PA 02/09/19 6945    Dorie Rank, MD 02/10/19 1127

## 2019-02-08 NOTE — Telephone Encounter (Signed)
Patient was scheduled an appointment with Zelda on 02/11/2019

## 2019-02-08 NOTE — ED Notes (Signed)
Called lab to verify labwork was received. They verified they had the blood and was about to run it. Spoke to [facility]

## 2019-02-08 NOTE — Telephone Encounter (Signed)
Patient daughter called to inform patient nail was cracked in half and is bleeding and they are concerned about pus. Please follow up.

## 2019-02-08 NOTE — ED Provider Notes (Signed)
11:50 PM  Assumed care from Dr. Tomi Bamberger.  Patient is a 63 year old female who presents with CHF exacerbation.  Patient receiving nitroglycerin and Lasix.  Labs pending.  Will need admission.   EKG Interpretation  Date/Time:  Friday February 08 2019 22:50:20 EST Ventricular Rate:  139 PR Interval:    QRS Duration: 84 QT Interval:  278 QTC Calculation: 423 R Axis:   65 Text Interpretation: Sinus tachycardia Low voltage, extremity leads Nonspecific repol abnormality, diffuse leads Since last tracing rate faster Confirmed by Dorie Rank [phone]) on 02/08/2019 11:13:55 PM         EKG Interpretation  Date/Time:  Friday February 08 2019 23:54:40 EST Ventricular Rate:  91 PR Interval:    QRS Duration: 91 QT Interval:  336 QTC Calculation: 414 R Axis:   46 Text Interpretation: Sinus rhythm Ventricular premature complex Borderline repolarization abnormality Rate has improved compared to prior Confirmed by Ronni Osterberg, Cyril Mourning [phone]) on 02/09/2019 12:06:32 AM         Breionna Punt, Delice Bison, DO 02/09/19 1062

## 2019-02-09 ENCOUNTER — Encounter (HOSPITAL_COMMUNITY): Payer: Self-pay | Admitting: Internal Medicine

## 2019-02-09 DIAGNOSIS — I129 Hypertensive chronic kidney disease with stage 1 through stage 4 chronic kidney disease, or unspecified chronic kidney disease: Secondary | ICD-10-CM | POA: Diagnosis not present

## 2019-02-09 DIAGNOSIS — Z9981 Dependence on supplemental oxygen: Secondary | ICD-10-CM | POA: Diagnosis not present

## 2019-02-09 DIAGNOSIS — I5043 Acute on chronic combined systolic (congestive) and diastolic (congestive) heart failure: Secondary | ICD-10-CM | POA: Diagnosis not present

## 2019-02-09 DIAGNOSIS — Z91018 Allergy to other foods: Secondary | ICD-10-CM | POA: Diagnosis not present

## 2019-02-09 DIAGNOSIS — Z794 Long term (current) use of insulin: Secondary | ICD-10-CM | POA: Diagnosis not present

## 2019-02-09 DIAGNOSIS — R0602 Shortness of breath: Secondary | ICD-10-CM | POA: Diagnosis not present

## 2019-02-09 DIAGNOSIS — N179 Acute kidney failure, unspecified: Secondary | ICD-10-CM | POA: Diagnosis not present

## 2019-02-09 DIAGNOSIS — J9601 Acute respiratory failure with hypoxia: Secondary | ICD-10-CM | POA: Diagnosis present

## 2019-02-09 DIAGNOSIS — E7800 Pure hypercholesterolemia, unspecified: Secondary | ICD-10-CM | POA: Diagnosis present

## 2019-02-09 DIAGNOSIS — E785 Hyperlipidemia, unspecified: Secondary | ICD-10-CM | POA: Diagnosis present

## 2019-02-09 DIAGNOSIS — E1122 Type 2 diabetes mellitus with diabetic chronic kidney disease: Secondary | ICD-10-CM | POA: Diagnosis not present

## 2019-02-09 DIAGNOSIS — Z91040 Latex allergy status: Secondary | ICD-10-CM | POA: Diagnosis not present

## 2019-02-09 DIAGNOSIS — D631 Anemia in chronic kidney disease: Secondary | ICD-10-CM | POA: Diagnosis not present

## 2019-02-09 DIAGNOSIS — Z8619 Personal history of other infectious and parasitic diseases: Secondary | ICD-10-CM | POA: Diagnosis not present

## 2019-02-09 DIAGNOSIS — Z7982 Long term (current) use of aspirin: Secondary | ICD-10-CM | POA: Diagnosis not present

## 2019-02-09 DIAGNOSIS — I5041 Acute combined systolic (congestive) and diastolic (congestive) heart failure: Secondary | ICD-10-CM | POA: Diagnosis not present

## 2019-02-09 DIAGNOSIS — I5021 Acute systolic (congestive) heart failure: Secondary | ICD-10-CM | POA: Diagnosis not present

## 2019-02-09 DIAGNOSIS — I340 Nonrheumatic mitral (valve) insufficiency: Secondary | ICD-10-CM | POA: Diagnosis not present

## 2019-02-09 DIAGNOSIS — I214 Non-ST elevation (NSTEMI) myocardial infarction: Secondary | ICD-10-CM

## 2019-02-09 DIAGNOSIS — Z833 Family history of diabetes mellitus: Secondary | ICD-10-CM | POA: Diagnosis not present

## 2019-02-09 DIAGNOSIS — I361 Nonrheumatic tricuspid (valve) insufficiency: Secondary | ICD-10-CM | POA: Diagnosis not present

## 2019-02-09 DIAGNOSIS — I25118 Atherosclerotic heart disease of native coronary artery with other forms of angina pectoris: Secondary | ICD-10-CM | POA: Diagnosis not present

## 2019-02-09 DIAGNOSIS — R0600 Dyspnea, unspecified: Secondary | ICD-10-CM | POA: Diagnosis not present

## 2019-02-09 DIAGNOSIS — Z885 Allergy status to narcotic agent status: Secondary | ICD-10-CM | POA: Diagnosis not present

## 2019-02-09 DIAGNOSIS — Z8673 Personal history of transient ischemic attack (TIA), and cerebral infarction without residual deficits: Secondary | ICD-10-CM | POA: Diagnosis not present

## 2019-02-09 DIAGNOSIS — Z823 Family history of stroke: Secondary | ICD-10-CM | POA: Diagnosis not present

## 2019-02-09 DIAGNOSIS — I429 Cardiomyopathy, unspecified: Secondary | ICD-10-CM | POA: Diagnosis not present

## 2019-02-09 DIAGNOSIS — E1165 Type 2 diabetes mellitus with hyperglycemia: Secondary | ICD-10-CM | POA: Diagnosis not present

## 2019-02-09 DIAGNOSIS — I2510 Atherosclerotic heart disease of native coronary artery without angina pectoris: Secondary | ICD-10-CM | POA: Diagnosis present

## 2019-02-09 DIAGNOSIS — J9621 Acute and chronic respiratory failure with hypoxia: Secondary | ICD-10-CM | POA: Diagnosis not present

## 2019-02-09 DIAGNOSIS — M109 Gout, unspecified: Secondary | ICD-10-CM | POA: Diagnosis present

## 2019-02-09 DIAGNOSIS — I428 Other cardiomyopathies: Secondary | ICD-10-CM | POA: Diagnosis present

## 2019-02-09 DIAGNOSIS — Z20828 Contact with and (suspected) exposure to other viral communicable diseases: Secondary | ICD-10-CM | POA: Diagnosis present

## 2019-02-09 DIAGNOSIS — I5023 Acute on chronic systolic (congestive) heart failure: Secondary | ICD-10-CM | POA: Diagnosis not present

## 2019-02-09 DIAGNOSIS — I130 Hypertensive heart and chronic kidney disease with heart failure and stage 1 through stage 4 chronic kidney disease, or unspecified chronic kidney disease: Secondary | ICD-10-CM | POA: Diagnosis present

## 2019-02-09 DIAGNOSIS — I252 Old myocardial infarction: Secondary | ICD-10-CM | POA: Diagnosis not present

## 2019-02-09 DIAGNOSIS — N183 Chronic kidney disease, stage 3 unspecified: Secondary | ICD-10-CM | POA: Diagnosis present

## 2019-02-09 HISTORY — DX: Non-ST elevation (NSTEMI) myocardial infarction: I21.4

## 2019-02-09 HISTORY — DX: Acute respiratory failure with hypoxia: J96.01

## 2019-02-09 LAB — CBC WITH DIFFERENTIAL/PLATELET
Abs Immature Granulocytes: 0.04 10*3/uL (ref 0.00–0.07)
Abs Immature Granulocytes: 0.05 10*3/uL (ref 0.00–0.07)
Basophils Absolute: 0 10*3/uL (ref 0.0–0.1)
Basophils Absolute: 0 10*3/uL (ref 0.0–0.1)
Basophils Relative: 0 %
Basophils Relative: 0 %
Eosinophils Absolute: 0.1 10*3/uL (ref 0.0–0.5)
Eosinophils Absolute: 0.1 10*3/uL (ref 0.0–0.5)
Eosinophils Relative: 1 %
Eosinophils Relative: 1 %
HCT: 31.6 % — ABNORMAL LOW (ref 36.0–46.0)
HCT: 37.6 % (ref 36.0–46.0)
Hemoglobin: 11.6 g/dL — ABNORMAL LOW (ref 12.0–15.0)
Hemoglobin: 9.5 g/dL — ABNORMAL LOW (ref 12.0–15.0)
Immature Granulocytes: 0 %
Immature Granulocytes: 1 %
Lymphocytes Relative: 4 %
Lymphocytes Relative: 8 %
Lymphs Abs: 0.4 10*3/uL — ABNORMAL LOW (ref 0.7–4.0)
Lymphs Abs: 0.8 10*3/uL (ref 0.7–4.0)
MCH: 27.2 pg (ref 26.0–34.0)
MCH: 28 pg (ref 26.0–34.0)
MCHC: 30.1 g/dL (ref 30.0–36.0)
MCHC: 30.9 g/dL (ref 30.0–36.0)
MCV: 90.5 fL (ref 80.0–100.0)
MCV: 90.8 fL (ref 80.0–100.0)
Monocytes Absolute: 0.5 10*3/uL (ref 0.1–1.0)
Monocytes Absolute: 0.7 10*3/uL (ref 0.1–1.0)
Monocytes Relative: 5 %
Monocytes Relative: 7 %
Neutro Abs: 7.4 10*3/uL (ref 1.7–7.7)
Neutro Abs: 8.8 10*3/uL — ABNORMAL HIGH (ref 1.7–7.7)
Neutrophils Relative %: 84 %
Neutrophils Relative %: 89 %
Platelets: 284 10*3/uL (ref 150–400)
Platelets: 320 10*3/uL (ref 150–400)
RBC: 3.49 MIL/uL — ABNORMAL LOW (ref 3.87–5.11)
RBC: 4.14 MIL/uL (ref 3.87–5.11)
RDW: 14.6 % (ref 11.5–15.5)
RDW: 14.7 % (ref 11.5–15.5)
WBC: 8.9 10*3/uL (ref 4.0–10.5)
WBC: 9.9 10*3/uL (ref 4.0–10.5)
nRBC: 0 % (ref 0.0–0.2)
nRBC: 0 % (ref 0.0–0.2)

## 2019-02-09 LAB — PROTIME-INR
INR: 1.1 (ref 0.8–1.2)
Prothrombin Time: 14.1 seconds (ref 11.4–15.2)

## 2019-02-09 LAB — COMPREHENSIVE METABOLIC PANEL
ALT: 12 U/L (ref 0–44)
AST: 21 U/L (ref 15–41)
Albumin: 2.6 g/dL — ABNORMAL LOW (ref 3.5–5.0)
Alkaline Phosphatase: 64 U/L (ref 38–126)
Anion gap: 10 (ref 5–15)
BUN: 39 mg/dL — ABNORMAL HIGH (ref 8–23)
CO2: 29 mmol/L (ref 22–32)
Calcium: 9.3 mg/dL (ref 8.9–10.3)
Chloride: 99 mmol/L (ref 98–111)
Creatinine, Ser: 1.39 mg/dL — ABNORMAL HIGH (ref 0.44–1.00)
GFR calc Af Amer: 46 mL/min — ABNORMAL LOW (ref >60)
GFR calc non Af Amer: 40 mL/min — ABNORMAL LOW (ref >60)
Glucose, Bld: 181 mg/dL — ABNORMAL HIGH (ref 70–99)
Potassium: 4 mmol/L (ref 3.5–5.1)
Sodium: 138 mmol/L (ref 135–145)
Total Bilirubin: 0.1 mg/dL — ABNORMAL LOW (ref 0.3–1.2)
Total Protein: 5.8 g/dL — ABNORMAL LOW (ref 6.5–8.1)

## 2019-02-09 LAB — HEPARIN LEVEL (UNFRACTIONATED): Heparin Unfractionated: 0.73 IU/mL — ABNORMAL HIGH (ref 0.30–0.70)

## 2019-02-09 LAB — BASIC METABOLIC PANEL
Anion gap: 10 (ref 5–15)
BUN: 39 mg/dL — ABNORMAL HIGH (ref 8–23)
CO2: 29 mmol/L (ref 22–32)
Calcium: 9.7 mg/dL (ref 8.9–10.3)
Chloride: 101 mmol/L (ref 98–111)
Creatinine, Ser: 1.41 mg/dL — ABNORMAL HIGH (ref 0.44–1.00)
GFR calc Af Amer: 46 mL/min — ABNORMAL LOW (ref >60)
GFR calc non Af Amer: 39 mL/min — ABNORMAL LOW (ref >60)
Glucose, Bld: 222 mg/dL — ABNORMAL HIGH (ref 70–99)
Potassium: 3.9 mmol/L (ref 3.5–5.1)
Sodium: 140 mmol/L (ref 135–145)

## 2019-02-09 LAB — GLUCOSE, CAPILLARY
Glucose-Capillary: 117 mg/dL — ABNORMAL HIGH (ref 70–99)
Glucose-Capillary: 207 mg/dL — ABNORMAL HIGH (ref 70–99)

## 2019-02-09 LAB — BLOOD GAS, ARTERIAL
Acid-Base Excess: 4.8 mmol/L — ABNORMAL HIGH (ref 0.0–2.0)
Bicarbonate: 30.3 mmol/L — ABNORMAL HIGH (ref 20.0–28.0)
FIO2: 100
O2 Saturation: 96.5 %
Patient temperature: 98.8
pCO2 arterial: 51.9 mmHg — ABNORMAL HIGH (ref 32.0–48.0)
pH, Arterial: 7.385 (ref 7.350–7.450)
pO2, Arterial: 85.6 mmHg (ref 83.0–108.0)

## 2019-02-09 LAB — TROPONIN I (HIGH SENSITIVITY)
Troponin I (High Sensitivity): 25 ng/L — ABNORMAL HIGH (ref <18)
Troponin I (High Sensitivity): 3166 ng/L (ref <18)
Troponin I (High Sensitivity): 660 ng/L (ref <18)

## 2019-02-09 LAB — BRAIN NATRIURETIC PEPTIDE: B Natriuretic Peptide: 380.8 pg/mL — ABNORMAL HIGH (ref 0.0–100.0)

## 2019-02-09 LAB — CBG MONITORING, ED
Glucose-Capillary: 178 mg/dL — ABNORMAL HIGH (ref 70–99)
Glucose-Capillary: 126 mg/dL — ABNORMAL HIGH (ref 70–99)

## 2019-02-09 LAB — APTT: aPTT: 33 seconds (ref 24–36)

## 2019-02-09 LAB — MAGNESIUM: Magnesium: 2.1 mg/dL (ref 1.7–2.4)

## 2019-02-09 LAB — SARS CORONAVIRUS 2 (TAT 6-24 HRS): SARS Coronavirus 2: NEGATIVE

## 2019-02-09 LAB — TSH: TSH: 6.624 u[IU]/mL — ABNORMAL HIGH (ref 0.350–4.500)

## 2019-02-09 MED ORDER — CARVEDILOL 25 MG PO TABS
25.0000 mg | ORAL_TABLET | Freq: Two times a day (BID) | ORAL | Status: DC
  Administered 2019-02-09 – 2019-02-19 (×21): 25 mg via ORAL
  Filled 2019-02-09 (×22): qty 1

## 2019-02-09 MED ORDER — ASPIRIN EC 81 MG PO TBEC
81.0000 mg | DELAYED_RELEASE_TABLET | Freq: Every day | ORAL | Status: DC
  Administered 2019-02-09 – 2019-02-19 (×11): 81 mg via ORAL
  Filled 2019-02-09 (×11): qty 1

## 2019-02-09 MED ORDER — ISOSORB DINITRATE-HYDRALAZINE 20-37.5 MG PO TABS
1.0000 | ORAL_TABLET | Freq: Three times a day (TID) | ORAL | Status: DC
  Administered 2019-02-09 – 2019-02-19 (×28): 1 via ORAL
  Filled 2019-02-09 (×33): qty 1

## 2019-02-09 MED ORDER — ALLOPURINOL 300 MG PO TABS
150.0000 mg | ORAL_TABLET | Freq: Every day | ORAL | Status: DC
  Administered 2019-02-09 – 2019-02-19 (×11): 150 mg via ORAL
  Filled 2019-02-09 (×3): qty 1
  Filled 2019-02-09: qty 0.5
  Filled 2019-02-09 (×8): qty 1

## 2019-02-09 MED ORDER — INSULIN GLARGINE 100 UNIT/ML ~~LOC~~ SOLN
130.0000 [IU] | Freq: Every day | SUBCUTANEOUS | Status: DC
  Filled 2019-02-09 (×2): qty 1.3

## 2019-02-09 MED ORDER — HEPARIN (PORCINE) 25000 UT/250ML-% IV SOLN
1000.0000 [IU]/h | INTRAVENOUS | Status: DC
  Administered 2019-02-09 (×2): 1150 [IU]/h via INTRAVENOUS
  Administered 2019-02-10: 1000 [IU]/h via INTRAVENOUS
  Filled 2019-02-09 (×3): qty 250

## 2019-02-09 MED ORDER — POLYETHYLENE GLYCOL 3350 17 G PO PACK: 17.0000 g | PACK | Freq: Every day | ORAL | Status: DC | PRN

## 2019-02-09 MED ORDER — INSULIN GLARGINE 100 UNIT/ML ~~LOC~~ SOLN
35.0000 [IU] | Freq: Every day | SUBCUTANEOUS | Status: DC
  Administered 2019-02-09 – 2019-02-10 (×2): 35 [IU] via SUBCUTANEOUS
  Filled 2019-02-09 (×3): qty 0.35

## 2019-02-09 MED ORDER — FUROSEMIDE 10 MG/ML IJ SOLN
40.0000 mg | Freq: Once | INTRAMUSCULAR | Status: AC
  Administered 2019-02-09: 40 mg via INTRAVENOUS
  Filled 2019-02-09: qty 4

## 2019-02-09 MED ORDER — ACETAMINOPHEN 650 MG RE SUPP: 650.0000 mg | Freq: Four times a day (QID) | RECTAL | Status: DC | PRN

## 2019-02-09 MED ORDER — DULOXETINE HCL 30 MG PO CPEP
30.0000 mg | ORAL_CAPSULE | Freq: Every day | ORAL | Status: DC
  Administered 2019-02-09 – 2019-02-19 (×11): 30 mg via ORAL
  Filled 2019-02-09 (×11): qty 1

## 2019-02-09 MED ORDER — ONDANSETRON HCL 4 MG PO TABS: 4.0000 mg | ORAL_TABLET | Freq: Four times a day (QID) | ORAL | Status: DC | PRN

## 2019-02-09 MED ORDER — FUROSEMIDE 10 MG/ML IJ SOLN
40.0000 mg | Freq: Two times a day (BID) | INTRAMUSCULAR | Status: DC
  Administered 2019-02-09 – 2019-02-11 (×6): 40 mg via INTRAVENOUS
  Filled 2019-02-09 (×6): qty 4

## 2019-02-09 MED ORDER — ACETAMINOPHEN 325 MG PO TABS
650.0000 mg | ORAL_TABLET | Freq: Four times a day (QID) | ORAL | Status: DC | PRN
  Administered 2019-02-10 – 2019-02-11 (×2): 650 mg via ORAL
  Filled 2019-02-09 (×2): qty 2

## 2019-02-09 MED ORDER — PANTOPRAZOLE SODIUM 20 MG PO TBEC
20.0000 mg | DELAYED_RELEASE_TABLET | Freq: Every day | ORAL | Status: DC
  Administered 2019-02-09 – 2019-02-19 (×11): 20 mg via ORAL
  Filled 2019-02-09 (×11): qty 1

## 2019-02-09 MED ORDER — ASPIRIN 81 MG PO CHEW
324.0000 mg | CHEWABLE_TABLET | Freq: Once | ORAL | Status: AC
  Administered 2019-02-09: 324 mg via ORAL
  Filled 2019-02-09: qty 4

## 2019-02-09 MED ORDER — BISACODYL 5 MG PO TBEC
10.0000 mg | DELAYED_RELEASE_TABLET | Freq: Every day | ORAL | Status: DC | PRN
  Filled 2019-02-09: qty 2

## 2019-02-09 MED ORDER — ONDANSETRON HCL 4 MG/2ML IJ SOLN: 4.0000 mg | Freq: Four times a day (QID) | INTRAMUSCULAR | Status: DC | PRN

## 2019-02-09 MED ORDER — HEPARIN BOLUS VIA INFUSION
2000.0000 [IU] | Freq: Once | INTRAVENOUS | Status: AC
  Administered 2019-02-09: 2000 [IU] via INTRAVENOUS
  Filled 2019-02-09: qty 2000

## 2019-02-09 MED ORDER — GABAPENTIN 100 MG PO CAPS
100.0000 mg | ORAL_CAPSULE | Freq: Two times a day (BID) | ORAL | Status: DC
  Administered 2019-02-09 – 2019-02-19 (×20): 100 mg via ORAL
  Filled 2019-02-09 (×20): qty 1

## 2019-02-09 MED ORDER — INSULIN ASPART 100 UNIT/ML ~~LOC~~ SOLN
0.0000 [IU] | SUBCUTANEOUS | Status: DC
  Administered 2019-02-09: 3 [IU] via SUBCUTANEOUS
  Administered 2019-02-09 (×2): 2 [IU] via SUBCUTANEOUS
  Administered 2019-02-10 – 2019-02-12 (×4): 1 [IU] via SUBCUTANEOUS
  Administered 2019-02-13: 2 [IU] via SUBCUTANEOUS
  Administered 2019-02-13 – 2019-02-16 (×6): 1 [IU] via SUBCUTANEOUS
  Administered 2019-02-16: 2 [IU] via SUBCUTANEOUS
  Administered 2019-02-17 – 2019-02-18 (×4): 1 [IU] via SUBCUTANEOUS
  Administered 2019-02-18 – 2019-02-19 (×3): 2 [IU] via SUBCUTANEOUS
  Filled 2019-02-09: qty 0.09

## 2019-02-09 MED ORDER — ATORVASTATIN CALCIUM 80 MG PO TABS
80.0000 mg | ORAL_TABLET | Freq: Every day | ORAL | Status: DC
  Administered 2019-02-09 – 2019-02-19 (×10): 80 mg via ORAL
  Filled 2019-02-09 (×10): qty 1

## 2019-02-09 NOTE — Progress Notes (Signed)
BAG drawn on 100% fio2 NRB, lab notified of ABG obtained and sent to them at [facility].

## 2019-02-09 NOTE — Progress Notes (Signed)
Transitioned to 6L [place]. O2 Sat. 100%. BP 146/100. Pt tolerating well with no signs of distress. Pt resting in bed.

## 2019-02-09 NOTE — ED Notes (Signed)
Called respiratory pt stating 87-91 on bipap. Tried readjusting mask

## 2019-02-09 NOTE — ED Notes (Signed)
Spoke to daughter on the phone. Explained pt would likely be admitted.

## 2019-02-09 NOTE — ED Notes (Signed)
Kara Hanson Kara Hanson(daughter)

## 2019-02-09 NOTE — ED Notes (Signed)
PT did not tolerate high-flow nasal canula. Respiratory called for bi-pap

## 2019-02-09 NOTE — Progress Notes (Signed)
ANTICOAGULATION CONSULT NOTE - Follow Up Consult  Pharmacy Consult for Heparin Indication: chest pain/ACS  Allergies  Allergen Reactions  . Garlic Shortness Of Breath, Itching and Swelling    Hand itching and swelling  . Latex Itching  . Morphine And Related Itching and Other (See Comments)    Headache   . Other Itching    Reaction to newspaper ink -itching and headache    Patient Measurements:   Height: 55 inches Weight: 77 kg  Heparin Dosing Weight: 73 kg  Vital Signs: Temp: 98.4 F (36.9 C) (11/07 1700) Temp Source: Axillary (11/07 1700) BP: 146/100 (11/07 1746) Pulse Rate: 80 (11/07 1530)  Labs: Recent Labs    02/07/19 0156 02/08/19 2330 02/09/19 0114 02/09/19 0258 02/09/19 0615 02/09/19 1300  HGB  --  11.6*  --   --  9.5*  --   HCT  --  37.6  --   --  31.6*  --   PLT  --  320  --   --  284  --   APTT  --   --   --  33  --   --   LABPROT  --   --   --  14.1  --   --   INR  --   --   --  1.1  --   --   HEPARINUNFRC  --   --   --   --   --  0.73*  CREATININE 1.65* 1.41*  --   --  1.39*  --   TROPONINIHS  --  25* 660*  --  3,166*  --     Estimated Creatinine Clearance: 42.5 mL/min (A) (by C-G formula based on SCr of 1.39 mg/dL (H)).   Medications:  Scheduled:  . allopurinol  150 mg Oral Daily  . aspirin EC  81 mg Oral Daily  . atorvastatin  80 mg Oral q1800  . carvedilol  25 mg Oral BID WC  . DULoxetine  30 mg Oral Daily  . furosemide  40 mg Intravenous Q12H  . gabapentin  100 mg Oral BID  . insulin aspart  0-9 Units Subcutaneous Q4H  . insulin glargine  130 Units Subcutaneous QHS  . isosorbide-hydrALAZINE  1 tablet Oral TID  . pantoprazole  20 mg Oral Daily   Infusions:  . heparin 1,150 Units/hr (02/09/19 1750)  . nitroGLYCERIN 25 mcg/min (02/09/19 6010)    Assessment: 63 yo F transferred from [facility] to [facility] for cards eval.  Pt complains of sudden onset of SOB and retrosternal pressure.    Pt started on heparin at [facility] prior to transfer.  Heparin  level is slightly above goal on 1150 units/hr.  No bleeding noted.  Goal of Therapy:  Heparin level 0.3-0.7 units/ml Monitor platelets by anticoagulation protocol: Yes   Plan:  Decrease heparin to 1050 units/hr Next heparin level in 6 hours. Daily heparin level and CBC.  [employer], Pharm.D., BCPS Clinical Pharmacist  **Pharmacist phone directory can now be found on [email or web address] ([email or web address]).  Listed under [place].  02/09/2019 6:41 PM

## 2019-02-09 NOTE — ED Notes (Signed)
Purewick placed on Pt. Pt explain she should try to urinate. Pt expressed she will when she feels the need.

## 2019-02-09 NOTE — H&P (Signed)
History and Physical    Kara Hanson HLK:[id number] DOB: 1956-02-22 DOA: 02/08/2019  PCP: [name], MD  Patient coming from: Home.  Chief Complaint: Shortness of breath.  HPI: Kara Hanson is a 63 y.o. female with history of nonischemic cardiomyopathy last EF measured was 45% last month diabetes mellitus type 2, chronic kidney disease stage III, hypertension just discharged home yesterday after being admitted for acute CHF and patient also was recently admitted in April 2020 for Covid pneumonia subsequent which patient also was admitted for community-acquired pneumonia and August 2020 presents to the ER with complaint of sudden onset of worsening shortness of breath since yesterday afternoon on the day after discharge.  Patient also started developing retrosternal chest pressure with no associated fever chills nausea vomiting productive cough.  EMS was called and patient was brought to the ER.  ED Course: In the ER patient was requiring 100% nonrebreather and eventually was placed on BiPAP.  Chest x-ray is consistent with CHF.  EKG shows sinus rhythm with PVCs with nonspecific T wave changes.  Initial troponin was around 25 and repeat 1 was 660.  BNP was 380.  Patient was given Lasix total of 80 mg IV.  Cardiology on-call was consulted requested starting heparin and nitroglycerin infusion.  Patient admitted for acute CHF and possible non-ST relation MI.  COVID-19 test is pending.  Labs show creatinine 1.4 hemoglobin 11.6.  Review of Systems: As per HPI, rest all negative.   Past Medical History:  Diagnosis Date  . CHF (congestive heart failure) ([place])   . CVA (cerebral vascular accident) ([place])   . Diabetes mellitus without complication ([place])   . Hypercholesteremia   . Hypertension   . Myocardial infarction ([place])   . Pneumonia 11/01/2018  . Spinal stenosis     Past Surgical History:  Procedure Laterality Date  . BIOPSY  01/27/2019   Procedure: BIOPSY;  Surgeon: Otis Brace, MD;  Location: [facility];  Service: Gastroenterology;;  . BLADDER SURGERY    . CARDIAC CATHETERIZATION N/A 04/25/2016   Procedure: Left Heart Cath and Coronary Angiography;  Surgeon: Lorretta Harp, MD;  Location: [place] [facility];  Service: Cardiovascular;  Laterality: N/A;  . CESAREAN SECTION    . CHOLECYSTECTOMY    . ESOPHAGOGASTRODUODENOSCOPY (EGD) WITH PROPOFOL N/A 01/27/2019   Procedure: ESOPHAGOGASTRODUODENOSCOPY (EGD) WITH PROPOFOL;  Surgeon: Otis Brace, MD;  Location: [facility];  Service: Gastroenterology;  Laterality: N/A;  . VIDEO BRONCHOSCOPY N/A 02/01/2019   Procedure: VIDEO BRONCHOSCOPY WITHOUT FLUORO;  Surgeon: Candee Furbish, MD;  Location: [facility];  Service: Endoscopy;  Laterality: N/A;     reports that she has never smoked. She has never used smokeless tobacco. She reports that she does not drink alcohol or use drugs.  Allergies  Allergen Reactions  . Garlic Shortness Of Breath, Itching and Swelling    Hand itching and swelling  . Latex Itching  . Morphine And Related Itching and Other (See Comments)    Headache   . Other Itching    Reaction to newspaper ink -itching and headache    Family History  Problem Relation Age of Onset  . Diabetes Mellitus II Father   . Stroke Father   . Healthy Mother        She is 66 years old.     Prior to Admission medications   Medication Sig Start Date End Date Taking? Authorizing Provider  allopurinol (ZYLOPRIM) 300 MG tablet Take 0.5 tablets (150 mg total) by mouth daily.  02/06/19 03/08/19 Yes Bonnell Public, MD  Amino Acids-Protein Hydrolys (FEEDING SUPPLEMENT, PRO-STAT SUGAR FREE 64,) LIQD Take 30 mLs by mouth 2 (two) times daily. 02/05/19  Yes Dana Allan I, MD  aspirin EC 81 MG tablet Take 81 mg by mouth daily.   Yes [provider]  bisacodyl (DULCOLAX) 5 MG EC tablet Take 2 tablets (10 mg total) by mouth daily for 4 days. Patient taking differently: Take 10 mg by mouth  daily as needed for moderate constipation.  02/07/19 02/11/19 Yes Georgette Shell, MD  carvedilol (COREG) 25 MG tablet Take 1 tablet (25 mg total) by mouth 2 (two) times daily with a meal. 02/05/19  Yes Bonnell Public, MD  ergocalciferol (DRISDOL) 1.25 MG (50000 UT) capsule Take 1 capsule (50,000 Units total) by mouth once a week for 4 doses. Patient taking differently: Take 50,000 Units by mouth every Sunday.  02/05/19 02/27/19 Yes Bonnell Public, MD  feeding supplement, ENSURE ENLIVE, (ENSURE ENLIVE) LIQD Take 237 mLs by mouth 3 (three) times daily between meals. 02/05/19  Yes Dana Allan I, MD  furosemide (LASIX) 40 MG tablet Take 1 tablet (40 mg total) by mouth daily. Start 11/8 02/07/19 03/09/19 Yes Georgette Shell, MD  gabapentin (NEURONTIN) 100 MG capsule Take 1 capsule (100 mg total) by mouth 2 (two) times daily. 02/05/19  Yes Dana Allan I, MD  insulin aspart (NOVOLOG) 100 UNIT/ML injection 0 to 12 units subcutaneously 3 times daily before meals as per sliding scale 03/29/18  Yes Newlin, Enobong, MD  sodium chloride (OCEAN) 0.65 % SOLN nasal spray Place 1 spray into both nostrils as needed for congestion.   Yes [provider]  SUPER B COMPLEX/C CAPS Take 1 capsule by mouth daily.   Yes [provider]  Accu-Chek FastClix Lancets MISC USE AS DIRECTED TO TEST BLOOD SUGAR THREE TIMES DAILY 07/31/18   [name], MD  atorvastatin (LIPITOR) 80 MG tablet Take 1 tablet (80 mg total) by mouth daily at 6 PM. 01/08/19 02/07/19  [name], MD  Blood Glucose Monitoring Suppl (ACCU-CHEK GUIDE) w/Device KIT 1 each by Does not apply route 3 (three) times daily. 07/31/18   [name], MD  DULoxetine (CYMBALTA) 30 MG capsule Take 1 capsule (30 mg total) by mouth daily. 02/06/19   Bonnell Public, MD  EASY COMFORT PEN NEEDLES 31G X 5 MM MISC USE FOUR TIMES PER DAY FOR INSULIN ADMINISTRATION Patient taking differently: 4 (four) times daily.  10/02/18    [name], MD  glucose blood (ACCU-CHEK GUIDE) test strip USE AS DIRECTED TO TEST BLOOD SUGAR THREE TIMES DAILY 07/31/18   [name], MD  Insulin Glargine (BASAGLAR KWIKPEN) 100 UNIT/ML SOPN Inject 0.05 mLs (5 Units total) into the skin at bedtime. Patient taking differently: Inject 35 Units into the skin at bedtime.  10/02/18   Bonnielee Haff, MD  isosorbide-hydrALAZINE (BIDIL) 20-37.5 MG tablet Take 1 tablet by mouth 3 (three) times daily. 02/05/19   Bonnell Public, MD  Lancet Device MISC Use as instructed 3 times daily 09/19/17   Rodell Perna A, PA-C  lansoprazole (PREVACID) 15 MG capsule Take 15 mg by mouth daily.  10/02/18   [provider]  metoCLOPramide (REGLAN) 5 MG tablet Take 1 tablet (5 mg total) by mouth 3 (three) times daily. 02/05/19 02/05/20  Bonnell Public, MD  Misc. Devices MISC Portable oxygen concentrator.  Diagnosis-chronic respiratory failure. 09/03/18   [name], MD  Misc. Devices MISC Rollaor with seat. Dx:  Congestive Heart Failure 10/19/18   [name], MD  polyethylene glycol (MIRALAX / GLYCOLAX) 17 g packet Take 17 g by mouth daily. Patient taking differently: Take 17 g by mouth daily as needed for moderate constipation.  10/02/18   Bonnielee Haff, MD    Physical Exam: Constitutional: Moderately built and nourished. Vitals:   02/09/19 0330 02/09/19 0340 02/09/19 0350 02/09/19 0409  BP: 134/64 140/64 140/64   Pulse: 83 81 85   Resp: (!) 30 (!) 31 16   Temp:      TempSrc:      SpO2: 90% 94% 92% 95%   Eyes: Anicteric no pallor. ENMT: No discharge from the ears eyes nose and mouth. Neck: JVD elevated no mass felt. Respiratory: No rhonchi bilateral crepitations. Cardiovascular: S1-S2 heard. Abdomen: Soft nontender bowel sounds present. Musculoskeletal: No edema. Skin: No rash. Neurologic: Alert awake oriented to time place and person.  Moves all extremities. Psychiatric: Appears normal.   Labs on Admission: I have personally  reviewed following labs and imaging studies  CBC: Recent Labs  Lab 02/03/19 0225 02/04/19 0251 02/05/19 0203 02/08/19 2330  WBC 5.3 4.7 4.4 9.9  NEUTROABS 3.9 3.3 3.0 8.8*  HGB 8.8* 9.0* 9.0* 11.6*  HCT 30.3* 30.4* 30.8* 37.6  MCV 92.4 91.8 92.5 90.8  PLT 266 253 259 387   Basic Metabolic Panel: Recent Labs  Lab 02/03/19 0225 02/04/19 0251 02/05/19 0203 02/05/19 1659 02/06/19 0156 02/07/19 0156 02/08/19 2330  NA 138 137  137 137 137 135  137 137 140  K 3.8 3.7  3.7 3.9 4.2 4.0  4.1 4.1 3.9  CL 99 98  98 100 99 98  99 100 101  CO2 _0 GLUCOSE 111* 131*  131* 140* 211* 172*  176* 166* 222*  BUN 34* 36*  36* 41* 50* 54*  48* 46* 39*  CREATININE 1.69* 1.93*  1.98* 1.98* 2.16* 2.12*  2.03* 1.65* 1.41*  CALCIUM 8.8* 8.9  8.9 9.0 9.2 8.7*  8.8* 9.0 9.7  MG 1.8 1.8 1.7  --  1.8 1.7  --   PHOS  --  3.6 3.5  --  3.1  --   --    GFR: Estimated Creatinine Clearance: 41.9 mL/min (A) (by C-G formula based on SCr of 1.41 mg/dL (H)). Liver Function Tests: Recent Labs  Lab 02/04/19 0251 02/05/19 0203 02/06/19 0156  ALBUMIN 2.6* 2.6* 2.7*   No results for input(s): LIPASE, AMYLASE in the last 168 hours. No results for input(s): AMMONIA in the last 168 hours. Coagulation Profile: Recent Labs  Lab 02/09/19 0258  INR 1.1   Cardiac Enzymes: No results for input(s): CKTOTAL, CKMB, CKMBINDEX, TROPONINI in the last 168 hours. BNP (last 3 results) No results for input(s): PROBNP in the last 8760 hours. HbA1C: No results for input(s): HGBA1C in the last 72 hours. CBG: Recent Labs  Lab 02/06/19 1620 02/06/19 2127 02/07/19 0825 02/07/19 1220 02/07/19 1648  GLUCAP 150* 133* 134* 165* 90   Lipid Profile: No results for input(s): CHOL, HDL, LDLCALC, TRIG, CHOLHDL, LDLDIRECT in the last 72 hours. Thyroid Function Tests: No results for input(s): TSH, T4TOTAL, FREET4, T3FREE, THYROIDAB in the last 72 hours. Anemia Panel: No results  for input(s): VITAMINB12, FOLATE, FERRITIN, TIBC, IRON, RETICCTPCT in the last 72 hours. Urine analysis:    Component Value Date/Time   COLORURINE YELLOW 01/28/2019 0714   APPEARANCEUR TURBID (A) 01/28/2019 0714   LABSPEC 1.015 01/28/2019 5643  PHURINE 5.0 01/28/2019 0714   GLUCOSEU 50 (A) 01/28/2019 0714   HGBUR NEGATIVE 01/28/2019 0714   BILIRUBINUR NEGATIVE 01/28/2019 0714   BILIRUBINUR negative 12/13/2017 [place] 01/28/2019 0714   PROTEINUR >=300 (A) 01/28/2019 0714   UROBILINOGEN 0.2 12/13/2017 1514   NITRITE NEGATIVE 01/28/2019 0714   LEUKOCYTESUR MODERATE (A) 01/28/2019 0714   Sepsis Labs: _0 (procalcitonin:4,lacticidven:4) ) Recent Results (from the past 240 hour(s))  Culture, body fluid-bottle     Status: None   Collection Time: 01/30/19 12:38 PM   Specimen: Pleura  Result Value Ref Range Status   Specimen Description PLEURAL LEFT  Final   Special Requests NONE  Final   Culture   Final    NO GROWTH 5 DAYS Performed at [facility] [address], [place], [place] [address]    Report Status 02/04/2019 FINAL  Final  Gram stain     Status: None   Collection Time: 01/30/19 12:38 PM   Specimen: Pleura  Result Value Ref Range Status   Specimen Description PLEURAL LEFT  Final   Special Requests NONE  Final   Gram Stain   Final    MODERATE WBC PRESENT, PREDOMINANTLY MONONUCLEAR NO ORGANISMS SEEN Performed at [facility], [place] [address], [place], [place] [address]    Report Status 01/30/2019 FINAL  Final  Culture, respiratory (non-expectorated)     Status: None   Collection Time: 02/01/19  3:43 PM   Specimen: Bronchoalveolar Lavage; Respiratory  Result Value Ref Range Status   Specimen Description   Final    BRONCHIAL ALVEOLAR LAVAGE Performed at [facility], [place] [address], [place], [place] [address]    Special Requests   Final    NONE Performed at [facility], [place] [address], [place], [place] [address]    Gram Stain   Final    ABUNDANT WBC PRESENT,BOTH PMN AND MONONUCLEAR RARE GRAM POSITIVE COCCI    Culture   Final    Consistent with normal respiratory flora. Performed at [facility], [place] [address] [place], [place] [address]    Report Status 02/04/2019 FINAL  Final  MRSA PCR Screening     Status: None   Collection Time: 02/01/19  4:00 PM   Specimen: Nasal Mucosa; Nasopharyngeal  Result Value Ref Range Status   MRSA by PCR NEGATIVE NEGATIVE Final    Comment:        The GeneXpert MRSA Assay (FDA approved for NASAL specimens only), is one component of a comprehensive MRSA colonization surveillance program. It is not intended to diagnose MRSA infection nor to guide or monitor treatment for MRSA infections. Performed at [facility], [place] [address], [place], [place] [address]      Radiological Exams on Admission: Xr Chest Portable  Result Date: 02/08/2019 CLINICAL DATA:  Shortness of breath EXAM: PORTABLE CHEST 1 VIEW COMPARISON:  February 03, 2019 FINDINGS: Heart size is enlarged. There are small to moderate-sized bilateral pleural effusions, right greater than left. There are diffuse hazy bilateral airspace opacities with areas of more focal consolidation/atelectasis at the lung bases. No pneumothorax. No acute osseous abnormality. IMPRESSION: Overall findings concerning for congestive heart failure and pulmonary edema as detailed above. Electronically Signed   By: Constance Holster M.D.   On: 02/08/2019 21:55    EKG: Independently reviewed.  Normal sinus rhythm with nonspecific T wave changes and PVCs.  Assessment/Plan Principal Problem:   Acute respiratory failure with hypoxia (  [place]) Active Problems:   Controlled type 2 diabetes mellitus with hyperglycemia (HCC)   Acute on chronic combined systolic and diastolic CHF (congestive heart failure) (HCC)   CKD (chronic kidney disease), stage III   NSTEMI (non-ST elevated  myocardial infarction) ([place])    1. Acute respiratory failure with hypoxia secondary to acute on chronic combined systolic and diastolic CHF last EF measured was 45,000 last month.  Patient is on nitroglycerin infusion Lasix 40 mg IV every 12 already received 80 mg.  Note that patient had recent worsening of renal function very closely follow intake output metabolic panel and daily weights.  Patient's troponin is elevated concerning for non-ST elevation MI may be contributing to patient's symptoms. 2. Possible non-ST relation MI for which patient is on heparin aspirin beta-blocker and statins.  Cardiology was notified and plan is to transfer to [place] serial cardiac markers 3. Hypertension presently on nitroglycerin infusion takes Bidil and Coreg. 4. Diabetes mellitus type 2 on Lantus insulin.  Patient continues to remain n.p.o. closely follow CBGs may have to decrease Lantus dose. 5. Chronic kidney disease stage III creatinine appears to be at baseline.  Note that patient recently had worsening renal function due to Lasix.  ACE inhibitor was withheld. 6. History of gout on allopurinol. 7. Anemia likely from renal disease follow CBC.  Given the acute CHF with possible non-ST elevation MI patient will need more than 2 midnight stay in inpatient status.  COVID-19 test is pending.   DVT prophylaxis: Heparin. Code Status: Full code. Family Communication: Discussed with patient. Disposition Plan: To be determined. Consults called: Cardiology. Admission status: Inpatient.   Rise Patience MD [facility] Pager [phone].  If 7PM-7AM, please contact night-coverage [email or web address] Password [facility]  02/09/2019, 4:39 AM

## 2019-02-09 NOTE — Progress Notes (Signed)
ABG results drResults for COMFORT, [name] (MRN [id number]) as of 02/09/2019 03:53  Ref. Range 02/09/2019 02:16  FIO2 Unknown 100.00  pH, Arterial Latest Ref Range: 7.350 - 7.450  7.385  pCO2 arterial Latest Ref Range: 32.0 - 48.0 mmHg 51.9 (H)  pO2, Arterial Latest Ref Range: 83.0 - 108.0 mmHg 85.6  Acid-Base Excess Latest Ref Range: 0.0 - 2.0 mmol/L 4.8 (H)  Bicarbonate Latest Ref Range: 20.0 - 28.0 mmol/L 30.3 (H)  O2 Saturation Latest Units: % 96.5  Patient temperature Unknown 98.8  Allens test (pass/fail) Latest Ref Range: PASS  PASS  awn on 100% NRB, prior to BIPAP:

## 2019-02-09 NOTE — ED Provider Notes (Signed)
11:50 PM  Assumed care from Dr. Tomi Bamberger.  Patient is a 63 year old female who presents with CHF exacerbation.  Patient receiving nitroglycerin and Lasix.  Labs pending.  Will need admission.  Was admitted 01/23/19 - 02/07/19 for same.  Patient's first troponin mildly elevated at 25.  Second troponin is 660.  BNP 380.  Patient has been on nonrebreather since arrival to the emergency department and desats quickly on 6 L nasal cannula.  Is not able to tolerate high flow [place].  Will need to be placed on BiPAP.  She was on BiPAP during her last admission.  Cardiologist is Dr. Marlou Porch.  She is on a nitroglycerin drip and currently chest pain-free.  Did have chest pressure on arrival.  Will discuss with cardiology and medicine.  2:24 AM Discussed patient's case with hospitalist, Dr. Hal Hope.  I have recommended admission and patient (and family if present) agree with this plan. Admitting physician will place admission orders.   I reviewed all nursing notes, vitals, pertinent previous records and interpreted all EKGs, lab and urine results, imaging (as available).  2:30 AM  Spoke with Dr. Marletta Lor with cardiology.  Recommends transfer to [facility].  Given ASA.  On NTG infusion and CP free.  No emergent cath tonight.  Will start heparin.  Agrees with medicaine admit.  CRITICAL CARE Performed by: Pryor Curia   Total critical care time: 43 minutes  Critical care time was exclusive of separately billable procedures and treating other patients.  Critical care was necessary to treat or prevent imminent or life-threatening deterioration.  Critical care was time spent personally by me on the following activities: development of treatment plan with patient and/or surrogate as well as nursing, discussions with consultants, evaluation of patient's response to treatment, examination of patient, obtaining history from patient or surrogate, ordering and performing treatments and interventions, ordering and review of laboratory  studies, ordering and review of radiographic studies, pulse oximetry and re-evaluation of patient's condition.     EKG Interpretation  Date/Time:  Friday February 08 2019 22:50:20 EST Ventricular Rate:  139 PR Interval:    QRS Duration: 84 QT Interval:  278 QTC Calculation: 423 R Axis:   65 Text Interpretation: Sinus tachycardia Low voltage, extremity leads Nonspecific repol abnormality, diffuse leads Since last tracing rate faster Confirmed by Dorie Rank [phone]) on 02/08/2019 11:13:55 PM         EKG Interpretation  Date/Time:  Friday February 08 2019 23:54:40 EST Ventricular Rate:  91 PR Interval:    QRS Duration: 91 QT Interval:  336 QTC Calculation: 414 R Axis:   46 Text Interpretation: Sinus rhythm Ventricular premature complex Borderline repolarization abnormality Rate has improved compared to prior Confirmed by Pryor Curia [phone]) on 02/09/2019 12:06:32 AM         EKG Interpretation  Date/Time:  Saturday February 09 2019 02:18:34 EST Ventricular Rate:  94 PR Interval:    QRS Duration: 90 QT Interval:  332 QTC Calculation: 416 R Axis:   68 Text Interpretation: Sinus rhythm Ventricular premature complex Borderline repolarization abnormality No significant change since last tracing Confirmed by Pryor Curia [phone]) on 02/09/2019 2:27:00 AM          Echo 01/24/2019:  IMPRESSIONS    1. Left ventricular ejection fraction, by visual estimation, is 40 to 45%. The left ventricle has normal function. There is moderately increased left ventricular hypertrophy.  2. Global hypokinesis, worse apically.  3. Left ventricular diastolic Doppler parameters are consistent with restrictive filling pattern of LV diastolic  filling.  4. Elevated left atrial and left ventricular end-diastolic pressures.  5. Global right ventricle has mildly reduced systolic function.The right ventricular size is normal. No increase in right ventricular wall thickness.  6. Left atrial size  was normal.  7. Right atrial size was normal.  8. Moderate pleural effusion in the left lateral region.  9. The mitral valve is abnormal. Mild mitral valve regurgitation. 10. The tricuspid valve is grossly normal. Tricuspid valve regurgitation is mild. 11. The aortic valve is tricuspid Aortic valve regurgitation is trivial by color flow Doppler. Mild aortic valve sclerosis without stenosis. 12. The pulmonic valve was grossly normal. Pulmonic valve regurgitation is trivial by color flow Doppler. 13. Mildly elevated pulmonary artery systolic pressure. 14. The inferior vena cava is normal in size with <50% respiratory variability, suggesting right atrial pressure of 8 mmHg.  [facility] 04/25/2016:   IMPRESSION:Ms Turi has essentially normal coronary arteries and normal LV function. I'm not sure what explains the discrepancy between the EF seen by 2-D echocardiogram and on left ventriculography. Medical therapy will be recommended. The patient may need further evaluation for syncope including either an event monitor or a loop recorder. The sheath was removed and a TR band was placed on the right wrist to achieve patient hemostasis. The patient left the lab in stable condition.   Trystyn Sitts, Delice Bison, DO 02/09/19 [phone]

## 2019-02-09 NOTE — Progress Notes (Signed)
Care started prior to midnight in the emergency room yesterday and patient was admitted early this morning after midnight and I am in current agreement with assessment and plan done by Dr. Gean Birchwood.  Additional changes to the plan of care have been made accordingly.  Essentially the patient is a 63 year old overweight African-American female with a past medical history significant for but not limited to history of nonischemic cardiomyopathy with the last EF of 45% which was done last month, diabetes mellitus type 2, chronic kidney disease stage III, hypertension, other comorbidities who was just discharged home yesterday and after being admitted for acute CHF.  She presented to the ED with a sudden onset of worsening shortness of breath since yesterday afternoon after being discharged.  Also started developing retrosternal pressure with no associated fevers chills nausea or vomiting.  EMS was called and patient brought to the ER and she is placed 100% nonrebreather and eventually transition to BiPAP because of her hypoxia.  Chest x-ray was consistent with CHF and initial troponin was 25 but then significantly trended up.  Patient was given IV Lasix and placed on a nitro drip and cardiology is consulted.  She was admitted for acute CHF exacerbation and possible non-ST elevation MI.  Cardiology recommended continue IV nitroglycerin and IV heparin as well as IV diuresis and stated that depending on how her creatinine responds with diuresis she may benefit from a right and left cardiac cath on Monday.  Currently she has transfer orders to go to [facility] pending bed availability and if she is transferred today she will be assigned to my partner and colleague Dr. Caroleen Hamman.  When I saw the patient this morning she is still on the BiPAP complaining of the face mask hurting.  We will continue to monitor patient's clinical response to intervention and will further await transfer to [facility].

## 2019-02-09 NOTE — Progress Notes (Signed)
ANTICOAGULATION CONSULT NOTE - Initial Consult  Pharmacy Consult for Heparin Indication: ACS / STEMI  Allergies  Allergen Reactions  . Garlic Shortness Of Breath, Itching and Swelling    Hand itching and swelling  . Latex Itching  . Morphine And Related Itching and Other (See Comments)    Headache   . Other Itching    Reaction to newspaper ink -itching and headache    Patient Measurements:   Heparin Dosing Weight:   Vital Signs: Temp: 98.8 F (37.1 C) (11/06 2125) Temp Source: Oral (11/06 2125) BP: 141/68 (11/07 0230) Pulse Rate: 94 (11/07 0230)  Labs: Recent Labs    02/07/19 0156 02/08/19 2330 02/09/19 0114  HGB  --  11.6*  --   HCT  --  37.6  --   PLT  --  320  --   CREATININE 1.65* 1.41*  --   TROPONINIHS  --  25* 660*    Estimated Creatinine Clearance: 41.9 mL/min (A) (by C-G formula based on SCr of 1.41 mg/dL (H)).   Medical History: Past Medical History:  Diagnosis Date  . CHF (congestive heart failure) ([place])   . CVA (cerebral vascular accident) ([place])   . Diabetes mellitus without complication ([place])   . Hypercholesteremia   . Hypertension   . Myocardial infarction ([place])   . Pneumonia 11/01/2018  . Spinal stenosis     Medications:  Infusions:  . heparin    . nitroGLYCERIN 25 mcg/min (02/08/19 2324)    Assessment: Patient with new ACS.  No oral anticoagulants noted on med rec.  Baseline coags ordered.  Goal of Therapy:  Heparin level 0.3-0.7 units/ml Monitor platelets by anticoagulation protocol: Yes   Plan:  Heparin bolus 2000 units iv x1 Heparin drip at 1150 units/hr Daily CBC Next heparin level at [address], [place] 02/09/2019,3:04 AM

## 2019-02-09 NOTE — Progress Notes (Signed)
Called by RN regarding decrease in saturations.  Fio2 increased to 80% with saturations currently 95%.  Will continue to monitor.

## 2019-02-09 NOTE — ED Notes (Addendum)
Tried to decrease O2 with PA at bedside Pt did not tolerate. Pt on non-rebreather 15L  98%

## 2019-02-09 NOTE — Progress Notes (Signed)
Report given to receiving RT at [facility]. Pt is on the way via [place]. RT at [facility] will have a Bi-PAP ready for pt's arrival.

## 2019-02-09 NOTE — Consult Note (Signed)
CARDIOLOGY CONSULT NOTE       Patient ID: Kara Hanson MRN: [id number] DOB/AGE: 64/23/1957 64 y.o.  Admit date: 02/08/2019 Referring Physician: Hal Hope Primary Physician: [name], MD Primary Cardiologist: Marlou Porch Reason for Consultation: CHF  Principal Problem:   Acute respiratory failure with hypoxia [facility]) Active Problems:   Controlled type 2 diabetes mellitus with hyperglycemia ([place])   Acute on chronic combined systolic and diastolic CHF (congestive heart failure) ([place])   CKD (chronic kidney disease), stage III   NSTEMI (non-ST elevated myocardial infarction) ([place])   HPI:  63 y.o. with history of [place] DCM 20`18 with normal coronary arteries, DM, CRF, HTN and Covid infection April 2020. Recurrent CHF last few weeks Readmitted with CHF requiring bipap. CXR edema, but BNP only 380 Denies SSCP to me Given iv lasix 80 mg and oxygen resting comfortably now. Cr stable at 1.4 Troponin elevated fro m660 to 3,166. ECG is non acute. Most recent echo 01/24/19 with EF 40-45% global hypokinesis worse in apex. Restrictive diastolic filling mild MR  ROS All other systems reviewed and negative except as noted above  Past Medical History:  Diagnosis Date   CHF (congestive heart failure) (HCC)    CVA (cerebral vascular accident) ([place])    Diabetes mellitus without complication ([place])    Hypercholesteremia    Hypertension    Myocardial infarction [facility])    Pneumonia 11/01/2018   Spinal stenosis     Family History  Problem Relation Age of Onset   Diabetes Mellitus II Father    Stroke Father    Healthy Mother        She is 32 years old.     Social History   Socioeconomic History   Marital status: Widowed    Spouse name: Not on file   Number of children: Not on file   Years of education: Not on file   Highest education level: Not on file  Occupational History   Not on file  Social Needs   Financial resource strain: Not on file   Food insecurity      Worry: Not on file    Inability: Not on file   Transportation needs    Medical: Not on file    Non-medical: Not on file  Tobacco Use   Smoking status: Never Smoker   Smokeless tobacco: Never Used  Substance and Sexual Activity   Alcohol use: No   Drug use: No   Sexual activity: Not Currently    Birth control/protection: None  Lifestyle   Physical activity    Days per week: Not on file    Minutes per session: Not on file   Stress: Not on file  Relationships   Social connections    Talks on phone: Not on file    Gets together: Not on file    Attends religious service: Not on file    Active member of club or organization: Not on file    Attends meetings of clubs or organizations: Not on file    Relationship status: Not on file   Intimate partner violence    Fear of current or ex partner: Not on file    Emotionally abused: Not on file    Physically abused: Not on file    Forced sexual activity: Not on file  Other Topics Concern   Not on file  Social History Narrative   Not on file    Past Surgical History:  Procedure Laterality Date   BIOPSY  01/27/2019   Procedure: BIOPSY;  Surgeon: Otis Brace, MD;  Location: [facility];  Service: Gastroenterology;;   BLADDER SURGERY     CARDIAC CATHETERIZATION N/A 04/25/2016   Procedure: Left Heart Cath and Coronary Angiography;  Surgeon: Lorretta Harp, MD;  Location: [place] [facility];  Service: Cardiovascular;  Laterality: N/A;   CESAREAN SECTION     CHOLECYSTECTOMY     ESOPHAGOGASTRODUODENOSCOPY (EGD) WITH PROPOFOL N/A 01/27/2019   Procedure: ESOPHAGOGASTRODUODENOSCOPY (EGD) WITH PROPOFOL;  Surgeon: Otis Brace, MD;  Location: [facility];  Service: Gastroenterology;  Laterality: N/A;   VIDEO BRONCHOSCOPY N/A 02/01/2019   Procedure: VIDEO BRONCHOSCOPY WITHOUT FLUORO;  Surgeon: Candee Furbish, MD;  Location: [facility];  Service: Endoscopy;  Laterality: N/A;      allopurinol  150 mg Oral  Daily   aspirin EC  81 mg Oral Daily   atorvastatin  80 mg Oral q1800   carvedilol  25 mg Oral BID WC   DULoxetine  30 mg Oral Daily   furosemide  40 mg Intravenous Q12H   gabapentin  100 mg Oral BID   insulin aspart  0-9 Units Subcutaneous Q4H   insulin glargine  130 Units Subcutaneous QHS   isosorbide-hydrALAZINE  1 tablet Oral TID   pantoprazole  20 mg Oral Daily    heparin 1,150 Units/hr (02/09/19 0343)   nitroGLYCERIN 25 mcg/min (02/08/19 2324)    Physical Exam: Blood pressure (!) 143/67, pulse 87, temperature 98.8 F (37.1 C), temperature source Oral, resp. rate 19, SpO2 100 %.   Affect appropriate Obese black female  HEENT: normal Neck supple with no adenopathy JVP normal no bruits no thyromegaly Lungs basilar rales on bipap  Heart:  S1/S2 no murmur, no rub, gallop or click PMI normal Abdomen: benighn, BS positve, no tenderness, no AAA no bruit.  No HSM or HJR Distal pulses intact with no bruits Plus one bilateral edema Neuro non-focal Skin warm and dry No muscular weakness   Labs:   Lab Results  Component Value Date   WBC 8.9 02/09/2019   HGB 9.5 (L) 02/09/2019   HCT 31.6 (L) 02/09/2019   MCV 90.5 02/09/2019   PLT 284 02/09/2019    Recent Labs  Lab 02/09/19 0615  NA 138  K 4.0  CL 99  CO2 29  BUN 39*  CREATININE 1.39*  CALCIUM 9.3  PROT 5.8*  BILITOT 0.1*  ALKPHOS 64  ALT 12  AST 21  GLUCOSE 181*   Lab Results  Component Value Date   TROPONINI <0.03 09/24/2018    Lab Results  Component Value Date   CHOL 353 (H) 01/09/2019   CHOL 295 (H) 03/24/2018   Lab Results  Component Value Date   HDL 52 01/09/2019   HDL 45 03/24/2018   Lab Results  Component Value Date   LDLCALC 251 (H) 01/09/2019   LDLCALC 215 (H) 03/24/2018   Lab Results  Component Value Date   TRIG 108 02/01/2019   TRIG 241 (H) 01/09/2019   TRIG 137 07/20/2018   Lab Results  Component Value Date   CHOLHDL 6.8 (H) 01/09/2019   CHOLHDL 6.6 03/24/2018    No results found for: LDLDIRECT    Radiology: Ct Abdomen Pelvis Wo Contrast  Result Date: 01/24/2019 CLINICAL DATA:  63 year old female with history of abdominal distension. Epigastric abdominal pain. EXAM: CT ABDOMEN AND PELVIS WITHOUT CONTRAST TECHNIQUE: Multidetector CT imaging of the abdomen and pelvis was performed following the standard protocol without IV contrast. COMPARISON:  CT the abdomen and pelvis 01/02/2018. FINDINGS: Lower chest:  Moderate bilateral pleural effusions with areas of atelectasis and/or consolidation in the lower lobes of the lungs bilaterally. Atherosclerotic calcifications in the thoracic aorta as well as the left main, left anterior descending, left circumflex and right coronary arteries. Calcifications of the aortic valve. Cardiomegaly. Hepatobiliary: No suspicious cystic or solid hepatic lesions are confidently identified on today's noncontrast CT examination. Status post cholecystectomy. Pancreas: Fatty atrophy in the pancreas. No discrete pancreatic mass, no definite pancreatic mass or peripancreatic fluid collections or inflammatory changes are noted on today's noncontrast CT examination. Spleen: Unremarkable. Adrenals/Urinary Tract: Unenhanced appearance of the kidneys and bilateral adrenal glands is unremarkable. No hydroureteronephrosis. Urinary bladder is unremarkable in appearance. Stomach/Bowel: Unenhanced appearance of the stomach is normal. There is no pathologic dilatation of small bowel or colon. Multiple scattered colonic diverticulae are noted, without surrounding inflammatory changes to suggest an acute diverticulitis at this time. Normal appendix. Vascular/Lymphatic: Aortic atherosclerosis. Retroaortic left renal vein (normal anatomical variant) incidentally noted. No lymphadenopathy identified in the abdomen or pelvis. Reproductive: Unenhanced appearance of the uterus and ovaries is unremarkable. Other: Small umbilical hernia containing only omental fat. No  significant volume of ascites. No pneumoperitoneum. Musculoskeletal: There are no aggressive appearing lytic or blastic lesions noted in the visualized portions of the skeleton. IMPRESSION: 1. No acute findings are noted in the abdomen or pelvis to account for the patient's symptoms. 2. Moderate bilateral pleural effusions with dependent areas of atelectasis and/or consolidation in the lower lobes of the lungs bilaterally. 3. Colonic diverticulosis without evidence of acute diverticulitis at this time. 4. Small umbilical hernia containing only omental fat. No associated bowel incarceration or obstruction at this time. Electronically Signed   By: Vinnie Langton M.D.   On: 01/24/2019 18:09   Dg Chest 1 View  Result Date: 02/03/2019 CLINICAL DATA:  Follow-up possible pneumonia. EXAM: CHEST  1 VIEW COMPARISON:  02/01/2019 FINDINGS: The endotracheal tube has been removed. The cardiac silhouette remains mildly enlarged. Significantly improved aeration of the left lower lobe with residual airspace opacity with air bronchograms. Mild diffuse accentuation of the interstitial markings with improvement. Minimal diffuse peribronchial thickening. Small left pleural effusion. Thoracic spine degenerative changes. IMPRESSION: 1. Significantly improved left lower lobe pneumonia/atelectasis. 2. Small left pleural effusion. 3. Minimal bronchitic changes. 4. Improved interstitial pulmonary edema with minimal residual interstitial pulmonary edema or chronic interstitial lung disease. 5. Mild cardiomegaly. Electronically Signed   By: Claudie Revering M.D.   On: 02/03/2019 08:10   Dg Chest 1 View  Result Date: 02/01/2019 CLINICAL DATA:  Post intubation EXAM: CHEST  1 VIEW COMPARISON:  Numerous recent priors, most recently 02/01/2019 FINDINGS: Low positioning of the endotracheal tube within the right mainstem bronchus. Recommend retraction 4 cm to the mid trachea. Improving aeration through the left lung. Residual airspace opacity  is present in the mid to lower lungs with central air bronchograms. Stable prominence of the cardiac silhouette. No pneumothorax. No acute osseous or soft tissue abnormality. Cholecystectomy clips in the right upper quadrant. IMPRESSION: 1. Low positioning of the endotracheal tube within the right mainstem bronchus. Recommend retraction approximately 4 cm to the mid trachea. 2. Improving aeration through the left lung. Residual airspace opacity in the mid to lower lungs with central air bronchograms, concerning for a superimposed pneumonia These results will be called to the ordering clinician or representative by the Radiologist Assistant, and communication documented in the PACS or zVision Dashboard. Electronically Signed   By: Lovena Le M.D.   On: 02/01/2019 17:34   Dg  Chest 1 View  Result Date: 01/30/2019 CLINICAL DATA:  Status post left thoracentesis. EXAM: CHEST  1 VIEW COMPARISON:  January 29, 2019. FINDINGS: Stable cardiomediastinal silhouette. Increased left upper lobe opacity is noted concerning for pneumonia. Stable left basilar atelectasis and/or pleural effusion is noted. Right lung is clear. No pneumothorax is noted. Bony thorax is unremarkable. IMPRESSION: No pneumothorax is noted. Increased left upper lobe opacity is noted concerning for pneumonia. Stable left basilar atelectasis and pleural effusion is noted. Electronically Signed   By: Marijo Conception M.D.   On: 01/30/2019 12:32   Dg Chest 2 View  Result Date: 02/01/2019 CLINICAL DATA:  Chest pain. EXAM: CHEST - 2 VIEW COMPARISON:  CT 01/31/2019. FINDINGS: Opacification of the left hemithorax again noted consistent with left lung atelectasis. Reference is made to prior CT report. Small right pleural effusion. No pneumothorax. Heart size cannot be evaluated. Degenerative change thoracic spine IMPRESSION: Opacification left hemithorax again noted consistent with left lung atelectasis again noted. No change from prior CT. Reference is  made to prior CT report 01/31/2019. Electronically Signed   By: Marcello Moores  Register   On: 02/01/2019 11:42   Ct Head Wo Contrast  Result Date: 01/28/2019 CLINICAL DATA:  63 year old female with altered mental status. EXAM: CT HEAD WITHOUT CONTRAST TECHNIQUE: Contiguous axial images were obtained from the base of the skull through the vertex without intravenous contrast. COMPARISON:  Head CT dated 04/25/2016 FINDINGS: Brain: The ventricles and sulci appropriate size for patient's age. Mild periventricular and deep white matter chronic microvascular ischemic changes noted. There is no acute intracranial hemorrhage. No mass effect or midline shift. No extra-axial fluid collection. Vascular: No hyperdense vessel or unexpected calcification. Skull: Normal. Negative for fracture or focal lesion. Sinuses/Orbits: The visualized paranasal sinuses and the left mastoid air cells are clear. Right mastoid effusions noted. Other: None IMPRESSION: 1. No acute intracranial hemorrhage. 2. Mild chronic microvascular ischemic changes. 3. Right mastoid effusions. Electronically Signed   By: Anner Crete M.D.   On: 01/28/2019 02:37   Ct Chest Wo Contrast  Result Date: 01/31/2019 CLINICAL DATA:  Shortness of breath. EXAM: CT CHEST WITHOUT CONTRAST TECHNIQUE: Multidetector CT imaging of the chest was performed following the standard protocol without IV contrast. COMPARISON:  Chest x-ray from yesterday. CTA chest dated November 27, 2017. FINDINGS: Cardiovascular: Unchanged mild cardiomegaly. No pericardial effusion. No thoracic aortic aneurysm. Coronary, aortic arch, and branch vessel atherosclerotic vascular disease. Mediastinum/Nodes: No pathologically enlarged mediastinal or axillary lymph nodes. Several prominent mediastinal lymph nodes measuring up to 9 mm in short axis are likely reactive. The thyroid gland, trachea, and esophagus demonstrate no significant findings. Lungs/Pleura: Prominent secretions within the left  mainstem bronchus and left upper and lower lobe bronchi with near complete collapse of the left upper lobe and complete collapse of the left lower lobe. The presence of air bronchograms in the left upper and lower lobes is suggestive of superimposed consolidation. Small to moderate bilateral pleural effusions. Dependent atelectasis in the right upper and lower lobes. Scattered mild interlobular septal thickening in the right lung. No pneumothorax. Upper Abdomen: No acute abnormality. Musculoskeletal: No chest wall mass or suspicious bone lesions identified. Mild anasarca. IMPRESSION: 1. Prominent secretions within the left mainstem and left upper and lower lobe bronchi with near complete collapse of the left upper lobe and complete collapse of the left lower lobe. The presence of air bronchograms in the left upper and lower lobes may reflect superimposed multifocal pneumonia. 2. Small to moderate bilateral pleural  effusions. 3. Mild interstitial pulmonary edema. 4. Aortic atherosclerosis (ICD10-I70.0). Electronically Signed   By: Titus Dubin M.D.   On: 01/31/2019 15:51   US Renal  Result Date: 01/28/2019 CLINICAL DATA:  63 year old female with acute renal insufficiency. History of diabetes and hypertension. Bladder surgery. EXAM: RENAL / URINARY TRACT ULTRASOUND COMPLETE COMPARISON:  Renal ultrasound dated 11/29/2017 FINDINGS: Right Kidney: Renal measurements: 11.3 x 5.6 x 5.3 cm = volume: 175 mL. Normal echogenicity. No hydronephrosis or shadowing stone. Left Kidney: Renal measurements: 10.3 x 4.7 x 6.0 cm = volume: 152 mL. Normal echogenicity. No hydronephrosis or shadowing stone. Bladder: The urinary bladder is decompressed around a Foley catheter. Other: None IMPRESSION: Unremarkable renal ultrasound. Electronically Signed   By: Anner Crete M.D.   On: 01/28/2019 19:50   Xr Chest Portable  Result Date: 02/08/2019 CLINICAL DATA:  Shortness of breath EXAM: PORTABLE CHEST 1 VIEW COMPARISON:   February 03, 2019 FINDINGS: Heart size is enlarged. There are small to moderate-sized bilateral pleural effusions, right greater than left. There are diffuse hazy bilateral airspace opacities with areas of more focal consolidation/atelectasis at the lung bases. No pneumothorax. No acute osseous abnormality. IMPRESSION: Overall findings concerning for congestive heart failure and pulmonary edema as detailed above. Electronically Signed   By: Constance Holster M.D.   On: 02/08/2019 21:55   Dg Chest Port 1 View  Result Date: 01/29/2019 CLINICAL DATA:  Dyspnea, shortness of breath, CHF, diabetic EXAM: PORTABLE CHEST 1 VIEW COMPARISON:  Radiograph 01/23/2019 FINDINGS: Veil like density in the left lung base suggesting a layering pleural effusion on a background of hazy interstitial opacities and cephalized, indistinct pulmonary vascularity with central vascular cuffing and septal lines. Prominence the cardiac silhouette is similar to comparison exams. No visible pneumothorax. Degenerative changes are present in the imaged spine and shoulders. No acute osseous or soft tissue abnormality. IMPRESSION: 1. Findings consistent with mild to moderate CHF with interstitial edema. Suspect a moderate layering pleural effusion on the left. 2. Unchanged prominent cardiac silhouette, which could reflect cardiomegaly or pericardial effusion. Electronically Signed   By: Lovena Le M.D.   On: 01/29/2019 17:35   Dg Chest Port 1 View  Result Date: 01/23/2019 CLINICAL DATA:  63 year old female with shortness of breath. On home oxygen. EXAM: PORTABLE CHEST 1 VIEW COMPARISON:  10/31/2018 and earlier. FINDINGS: Portable AP semi upright view at 1509 hours. Stable lung volumes and mediastinal contours. Visualized tracheal air column is within normal limits. Further increased bilateral interstitial and lung base opacity, with obscuration of the diaphragm now. No superimposed pneumothorax. No definite air bronchograms. Paucity bowel  gas in the upper abdomen. No acute osseous abnormality identified. IMPRESSION: Increasing bilateral lung opacity favored due to acute pulmonary edema with lung base atelectasis and possibly small pleural effusions. Bilateral lower lobe pneumonia felt less likely. Electronically Signed   By: Genevie Ann M.D.   On: 01/23/2019 15:18   US Thoracentesis Asp Pleural Space W/img Guide  Result Date: 01/30/2019 INDICATION: Patient with history of CHF, chronic kidney disease, bilateral pleural effusions. Request received for diagnostic and therapeutic left thoracentesis. EXAM: ULTRASOUND GUIDED DIAGNOSTIC AND THERAPEUTIC LEFT THORACENTESIS MEDICATIONS: None COMPLICATIONS: None immediate. PROCEDURE: An ultrasound guided thoracentesis was thoroughly discussed with the patient and questions answered. The benefits, risks, alternatives and complications were also discussed. The patient understands and wishes to proceed with the procedure. Written consent was obtained. Ultrasound was performed to localize and mark an adequate pocket of fluid in the left chest. The area was then  prepped and draped in the normal sterile fashion. 1% Lidocaine was used for local anesthesia. Under ultrasound guidance a 6 Fr Safe-T-Centesis catheter was introduced. Thoracentesis was performed. The catheter was removed and a dressing applied. FINDINGS: A total of approximately 270 cc of light yellow fluid was removed. Samples were sent to the laboratory as requested by the clinical team. IMPRESSION: Successful ultrasound guided diagnostic and therapeutic left thoracentesis yielding 270 cc of pleural fluid. Read by: Rowe Robert, PA-C Electronically Signed   By: Jacqulynn Cadet M.D.   On: 01/30/2019 12:19    EKG: SR PVC no acute ST changes    ASSESSMENT AND PLAN:   1. CHF:  Recurrent since COVID infection History of Takatsubo DCM Cath 04/25/16 by Dr Gwenlyn Found had no CAD. Given elevation in troponin will continue iv nitro and heparin repeat echo to  assess for worsening EF or new RWMAls. Continue iv diuresis Depending on how Cr does with diuresis may benefit from right and left cath on Monday given her recurrent issues with volume overload. Follow serial ECGls and enzymes No indication for acute cath at this time   2. DM:  Discussed low carb diet.  Target hemoglobin A1c is 6.5 or less.  Continue current medications.  3. HTN stable currently on iv nitro normally on bidil at home   4. CRF:  Follow Cr closely with diuresis stable 1.39 this am    Signed: Jenkins Rouge 02/09/2019, 9:34 AM

## 2019-02-10 ENCOUNTER — Inpatient Hospital Stay (HOSPITAL_COMMUNITY): Payer: Medicare Other

## 2019-02-10 DIAGNOSIS — I340 Nonrheumatic mitral (valve) insufficiency: Secondary | ICD-10-CM

## 2019-02-10 DIAGNOSIS — I361 Nonrheumatic tricuspid (valve) insufficiency: Secondary | ICD-10-CM

## 2019-02-10 LAB — COMPREHENSIVE METABOLIC PANEL
ALT: 10 U/L (ref 0–44)
AST: 17 U/L (ref 15–41)
Albumin: 2.1 g/dL — ABNORMAL LOW (ref 3.5–5.0)
Alkaline Phosphatase: 52 U/L (ref 38–126)
Anion gap: 8 (ref 5–15)
BUN: 35 mg/dL — ABNORMAL HIGH (ref 8–23)
CO2: 29 mmol/L (ref 22–32)
Calcium: 9.1 mg/dL (ref 8.9–10.3)
Chloride: 102 mmol/L (ref 98–111)
Creatinine, Ser: 1.59 mg/dL — ABNORMAL HIGH (ref 0.44–1.00)
GFR calc Af Amer: 39 mL/min — ABNORMAL LOW (ref >60)
GFR calc non Af Amer: 34 mL/min — ABNORMAL LOW (ref >60)
Glucose, Bld: 94 mg/dL (ref 70–99)
Potassium: 3.7 mmol/L (ref 3.5–5.1)
Sodium: 139 mmol/L (ref 135–145)
Total Bilirubin: 0.2 mg/dL — ABNORMAL LOW (ref 0.3–1.2)
Total Protein: 4.9 g/dL — ABNORMAL LOW (ref 6.5–8.1)

## 2019-02-10 LAB — CBC WITH DIFFERENTIAL/PLATELET
Abs Immature Granulocytes: 0.01 10*3/uL (ref 0.00–0.07)
Basophils Absolute: 0 10*3/uL (ref 0.0–0.1)
Basophils Relative: 0 %
Eosinophils Absolute: 0.1 10*3/uL (ref 0.0–0.5)
Eosinophils Relative: 2 %
HCT: 27 % — ABNORMAL LOW (ref 36.0–46.0)
Hemoglobin: 8.3 g/dL — ABNORMAL LOW (ref 12.0–15.0)
Immature Granulocytes: 0 %
Lymphocytes Relative: 14 %
Lymphs Abs: 0.8 10*3/uL (ref 0.7–4.0)
MCH: 27.6 pg (ref 26.0–34.0)
MCHC: 30.7 g/dL (ref 30.0–36.0)
MCV: 89.7 fL (ref 80.0–100.0)
Monocytes Absolute: 0.6 10*3/uL (ref 0.1–1.0)
Monocytes Relative: 11 %
Neutro Abs: 3.8 10*3/uL (ref 1.7–7.7)
Neutrophils Relative %: 73 %
Platelets: 234 10*3/uL (ref 150–400)
RBC: 3.01 MIL/uL — ABNORMAL LOW (ref 3.87–5.11)
RDW: 14.7 % (ref 11.5–15.5)
WBC: 5.3 10*3/uL (ref 4.0–10.5)
nRBC: 0 % (ref 0.0–0.2)

## 2019-02-10 LAB — MAGNESIUM: Magnesium: 1.9 mg/dL (ref 1.7–2.4)

## 2019-02-10 LAB — TROPONIN I (HIGH SENSITIVITY): Troponin I (High Sensitivity): 1015 ng/L (ref <18)

## 2019-02-10 LAB — ECHOCARDIOGRAM LIMITED: Weight: 2595.2 oz

## 2019-02-10 LAB — GLUCOSE, CAPILLARY
Glucose-Capillary: 125 mg/dL — ABNORMAL HIGH (ref 70–99)
Glucose-Capillary: 127 mg/dL — ABNORMAL HIGH (ref 70–99)
Glucose-Capillary: 146 mg/dL — ABNORMAL HIGH (ref 70–99)
Glucose-Capillary: 147 mg/dL — ABNORMAL HIGH (ref 70–99)
Glucose-Capillary: 150 mg/dL — ABNORMAL HIGH (ref 70–99)
Glucose-Capillary: 70 mg/dL (ref 70–99)

## 2019-02-10 LAB — PROCALCITONIN: Procalcitonin: <0.1 ng/mL

## 2019-02-10 LAB — HEPARIN LEVEL (UNFRACTIONATED)
Heparin Unfractionated: 0.59 IU/mL (ref 0.30–0.70)
Heparin Unfractionated: 0.72 IU/mL — ABNORMAL HIGH (ref 0.30–0.70)
Heparin Unfractionated: 0.47 IU/mL (ref 0.30–0.70)

## 2019-02-10 LAB — BRAIN NATRIURETIC PEPTIDE: B Natriuretic Peptide: 379.3 pg/mL — ABNORMAL HIGH (ref 0.0–100.0)

## 2019-02-10 LAB — PHOSPHORUS: Phosphorus: 3.1 mg/dL (ref 2.5–4.6)

## 2019-02-10 MED ORDER — BENZONATATE 100 MG PO CAPS
100.0000 mg | ORAL_CAPSULE | Freq: Three times a day (TID) | ORAL | Status: DC | PRN
  Administered 2019-02-10 – 2019-02-12 (×3): 100 mg via ORAL
  Filled 2019-02-10 (×3): qty 1

## 2019-02-10 NOTE — Progress Notes (Signed)
ANTICOAGULATION CONSULT NOTE - Follow Up Consult  Pharmacy Consult for Heparin Indication: chest pain/ACS  Allergies  Allergen Reactions  . Garlic Shortness Of Breath, Itching and Swelling    Hand itching and swelling  . Latex Itching  . Morphine And Related Itching and Other (See Comments)    Headache   . Other Itching    Reaction to newspaper ink -itching and headache    Patient Measurements: Weight: 162 lb 3.2 oz (73.6 kg) Height: 55 inches Weight: 77 kg  Heparin Dosing Weight: 73 kg  Vital Signs: BP: 131/53 (11/08 1526)  Labs: Recent Labs    02/08/19 2330 02/09/19 0114 02/09/19 0258 02/09/19 0615  02/10/19 0226 02/10/19 0919 02/10/19 1100 02/10/19 1802  HGB 11.6*  --   --  9.5*  --  8.3*  --   --   --   HCT 37.6  --   --  31.6*  --  27.0*  --   --   --   PLT 320  --   --  284  --  234  --   --   --   APTT  --   --  33  --   --   --   --   --   --   LABPROT  --   --  14.1  --   --   --   --   --   --   INR  --   --  1.1  --   --   --   --   --   --   HEPARINUNFRC  --   --   --   --    < > 0.59  --  0.72* 0.47  CREATININE 1.41*  --   --  1.39*  --  1.59*  --   --   --   TROPONINIHS 25* 660*  --  3,166*  --   --  1,015*  --   --    < > = values in this interval not displayed.    Estimated Creatinine Clearance: 36.3 mL/min (A) (by C-G formula based on SCr of 1.59 mg/dL (H)).   Medications:  Scheduled:  . allopurinol  150 mg Oral Daily  . aspirin EC  81 mg Oral Daily  . atorvastatin  80 mg Oral q1800  . carvedilol  25 mg Oral BID WC  . DULoxetine  30 mg Oral Daily  . furosemide  40 mg Intravenous Q12H  . gabapentin  100 mg Oral BID  . insulin aspart  0-9 Units Subcutaneous Q4H  . insulin glargine  35 Units Subcutaneous QHS  . isosorbide-hydrALAZINE  1 tablet Oral TID  . pantoprazole  20 mg Oral Daily   Infusions:  . heparin 1,000 Units/hr (02/10/19 1213)  . nitroGLYCERIN 25 mcg/min (02/09/19 9030)    Assessment: 63 yo F presented to [facility] ED with  complaints of sudden onset of SOB and retrosternal pressure and was transferred from [facility] ED to [facility] for cardiology evaluation. Pharmacy consulted to dose heparin for ACS. Troponin 3166 now downtrending.   Heparin level of 0.47 is therapeutic on heparin 1000 units/hr. Per nursing no signs of bleeding.  Goal of Therapy:  Heparin level 0.3-0.7 units/ml Monitor platelets by anticoagulation protocol: Yes   Plan:  Continue heparin at 1000 units/hr Check heparin level in AM Monitor heparin level, CBC, and S/S of bleeding daily  Follow up plans for cardiac cath   Oumar Marcott A. Levada Dy, PharmD, BCPS,  FNKF Clinical Pharmacist [place] Please utilize Amion for appropriate phone number to reach the unit pharmacist ([place])    02/10/2019 7:03 PM

## 2019-02-10 NOTE — Progress Notes (Addendum)
Progress Note  Patient Name: Kara Hanson Date of Encounter: 02/10/2019  Primary Cardiologist: Candee Furbish, MD   Subjective   SOB improving. No chest pain  Inpatient Medications    Scheduled Meds: . allopurinol  150 mg Oral Daily  . aspirin EC  81 mg Oral Daily  . atorvastatin  80 mg Oral q1800  . carvedilol  25 mg Oral BID WC  . DULoxetine  30 mg Oral Daily  . furosemide  40 mg Intravenous Q12H  . gabapentin  100 mg Oral BID  . insulin aspart  0-9 Units Subcutaneous Q4H  . insulin glargine  35 Units Subcutaneous QHS  . isosorbide-hydrALAZINE  1 tablet Oral TID  . pantoprazole  20 mg Oral Daily   Continuous Infusions: . heparin 1,050 Units/hr (02/09/19 1916)  . nitroGLYCERIN 25 mcg/min (02/09/19 0937)   PRN Meds: acetaminophen **OR** acetaminophen, bisacodyl, ondansetron **OR** ondansetron (ZOFRAN) IV, polyethylene glycol   Vital Signs    Vitals:   02/09/19 1700 02/09/19 1746 02/09/19 2122 02/10/19 0529  BP:  (!) 146/100 108/70 (!) 92/52  Pulse:   85 79  Resp:    18  Temp: 98.4 F (36.9 C)   97.8 F (36.6 C)  TempSrc: Axillary   Oral  SpO2:  100% 100% 100%  Weight:    73.6 kg    Intake/Output Summary (Last 24 hours) at 02/10/2019 0748 Last data filed at 02/10/2019 0426 Gross per 24 hour  Intake 1230.27 ml  Output 650 ml  Net 580.27 ml   Last 3 Weights 02/10/2019 02/07/2019 02/06/2019  Weight (lbs) 162 lb 3.2 oz 169 lb 15.6 oz 164 lb 10.9 oz  Weight (kg) 73.573 kg 77.1 kg 74.7 kg      Telemetry    SR - Personally Reviewed  ECG    SR, chronic ST/T changes - Personally Reviewed  Physical Exam   GEN: No acute distress.   Neck: No JVD Cardiac: RRR, no murmurs, rubs, or gallops.  Respiratory: bilateral crackles GI: Soft, nontender, non-distended  MS: No edema; No deformity. Neuro:  Nonfocal  Psych: Normal affect   Labs    High Sensitivity Troponin:   Recent Labs  Lab 01/23/19 1555 01/23/19 2034 02/08/19 2330 02/09/19 0114 02/09/19  0615  TROPONINIHS 11 13 25* 660* 3,166*      Chemistry Recent Labs  Lab 02/06/19 0156  02/08/19 2330 02/09/19 0615 02/10/19 0226  NA 135  137   < > 140 138 139  K 4.0  4.1   < > 3.9 4.0 3.7  CL 98  99   < > 101 99 102  CO2 30  30   < > 29 29 29   GLUCOSE 172*  176*   < > 222* 181* 94  BUN 54*  48*   < > 39* 39* 35*  CREATININE 2.12*  2.03*   < > 1.41* 1.39* 1.59*  CALCIUM 8.7*  8.8*   < > 9.7 9.3 9.1  PROT  --   --   --  5.8* 4.9*  ALBUMIN 2.7*  --   --  2.6* 2.1*  AST  --   --   --  21 17  ALT  --   --   --  12 10  ALKPHOS  --   --   --  64 52  BILITOT  --   --   --  0.1* 0.2*  GFRNONAA 24*  25*   < > 39* 40* 34*  GFRAA 28*  29*   < > 46* 46* 39*  ANIONGAP 7  8   < > 10 10 8    < > = values in this interval not displayed.     Hematology Recent Labs  Lab 02/08/19 2330 02/09/19 0615 02/10/19 0226  WBC 9.9 8.9 5.3  RBC 4.14 3.49* 3.01*  HGB 11.6* 9.5* 8.3*  HCT 37.6 31.6* 27.0*  MCV 90.8 90.5 89.7  MCH 28.0 27.2 27.6  MCHC 30.9 30.1 30.7  RDW 14.6 14.7 14.7  PLT 320 284 234    BNP Recent Labs  Lab 02/08/19 2330  BNP 380.8*     DDimer No results for input(s): DDIMER in the last 168 hours.   Radiology    Xr Chest Portable  Result Date: 02/08/2019 CLINICAL DATA:  Shortness of breath EXAM: PORTABLE CHEST 1 VIEW COMPARISON:  February 03, 2019 FINDINGS: Heart size is enlarged. There are small to moderate-sized bilateral pleural effusions, right greater than left. There are diffuse hazy bilateral airspace opacities with areas of more focal consolidation/atelectasis at the lung bases. No pneumothorax. No acute osseous abnormality. IMPRESSION: Overall findings concerning for congestive heart failure and pulmonary edema as detailed above. Electronically Signed   By: Constance Holster M.D.   On: 02/08/2019 21:55    Cardiac Studies     Patient Profile      63 y.o. with history of [place] DCM 20`18 with normal coronary arteries, DM, CRF, HTN and  Covid infection April 2020. Recurrent CHF last few weeks Readmitted with CHF requiring bipap  Assessment & Plan    1. Acute on chronic combinded systolic/diastolic HF - prior history of [place] CM in 2018, at that time coronaries normal by cath. LVEF later normalized - 01/2019 echo LVEF 40-45%, restrictive diastolic function, mild RV dysfunction - LVEF down from 10/2018 study  - presented intially severely hypoxic, on NRB and then bipap.   I/Os incomplete. She is on lasix IV 40mg  bid. Mild fluctuations in renal function - medical therapy with coreg 25mg  bid, bidil. No ACE/ARB/ARNI at this time due to renal dysfunction - breathing imrpoving, remains fluid overloaded by exam. Continue IV lasix.  - repeat limited echo   2. Elevated troponin - up to 3166 with an established peak, repeat this AM - echo shows mild drop in LVEF from prior study - medical therapy with ASA 81, atorva 80, coreg 25mg  bid. No ACE/ARB due to renal dysfunction. She is hep gtt, nitro gtt   **I have made patient npo at midnight. Cath orders not placed as will need to follw up renal function tomorrow. Pending Cr would plan for RHC/LHC tomorrow.    For questions or updates, please contact [place] Please consult [email or web address] for contact info under        Signed, Carlyle Dolly, MD  02/10/2019, 7:48 AM

## 2019-02-10 NOTE — Progress Notes (Signed)
*  PRELIMINARY RESULTS* Echocardiogram 2D Echocardiogram LIMITED has been performed.  Leavy Cella 02/10/2019, 4:20 PM

## 2019-02-10 NOTE — Progress Notes (Addendum)
ANTICOAGULATION CONSULT NOTE - Follow Up Consult  Pharmacy Consult for Heparin Indication: chest pain/ACS  Allergies  Allergen Reactions  . Garlic Shortness Of Breath, Itching and Swelling    Hand itching and swelling  . Latex Itching  . Morphine And Related Itching and Other (See Comments)    Headache   . Other Itching    Reaction to newspaper ink -itching and headache    Patient Measurements: Weight: 162 lb 3.2 oz (73.6 kg) Height: 55 inches Weight: 77 kg  Heparin Dosing Weight: 73 kg  Vital Signs: Temp: 97.8 F (36.6 C) (11/08 0529) Temp Source: Oral (11/08 0529) BP: 118/52 (11/08 1131) Pulse Rate: 79 (11/08 0529)  Labs: Recent Labs    02/08/19 2330 02/09/19 0114 02/09/19 0258 02/09/19 0615 02/09/19 1300 02/10/19 0226 02/10/19 0919 02/10/19 1100  HGB 11.6*  --   --  9.5*  --  8.3*  --   --   HCT 37.6  --   --  31.6*  --  27.0*  --   --   PLT 320  --   --  284  --  234  --   --   APTT  --   --  33  --   --   --   --   --   LABPROT  --   --  14.1  --   --   --   --   --   INR  --   --  1.1  --   --   --   --   --   HEPARINUNFRC  --   --   --   --  0.73* 0.59  --  0.72*  CREATININE 1.41*  --   --  1.39*  --  1.59*  --   --   TROPONINIHS 25* 660*  --  3,166*  --   --  1,015*  --     Estimated Creatinine Clearance: 36.3 mL/min (A) (by C-G formula based on SCr of 1.59 mg/dL (H)).   Medications:  Scheduled:  . allopurinol  150 mg Oral Daily  . aspirin EC  81 mg Oral Daily  . atorvastatin  80 mg Oral q1800  . carvedilol  25 mg Oral BID WC  . DULoxetine  30 mg Oral Daily  . furosemide  40 mg Intravenous Q12H  . gabapentin  100 mg Oral BID  . insulin aspart  0-9 Units Subcutaneous Q4H  . insulin glargine  35 Units Subcutaneous QHS  . isosorbide-hydrALAZINE  1 tablet Oral TID  . pantoprazole  20 mg Oral Daily   Infusions:  . heparin 1,050 Units/hr (02/09/19 1916)  . nitroGLYCERIN 25 mcg/min (02/09/19 1761)    Assessment: 63 yo F presented to [facility] ED  with complaints of sudden onset of SOB and retrosternal pressure and was transferred from [facility] ED to [facility] for cardiology evaluation. Pharmacy consulted to dose heparin for ACS. Troponin 3166 now downtrending.   Heparin level of 0.72 is supratherapeutic on heparin 1050 units/hr. Per nursing no signs of bleeding. Hgb 8.3. Plt 234.   Goal of Therapy:  Heparin level 0.3-0.7 units/ml Monitor platelets by anticoagulation protocol: Yes   Plan:  Decrease heparin to 1000 units/hr Check heparin level 1830 Monitor heparin level, CBC, and S/S of bleeding daily  Follow up plans for cardiac cath   Cristela Felt, PharmD PGY1 Pharmacy Resident [employer]: [phone]  02/10/2019 11:45 AM

## 2019-02-10 NOTE — Progress Notes (Addendum)
CRITICAL VALUE ALERT  Critical Value:  Troponin 1015  Date & Time Notied: 02/10/19 1015  Provider Notified: Lisbeth Renshaw Dunn 02/10/19 1017  Orders Received/Actions taken: awaiting return of call but previous troponin higher, she called back, no change in plan

## 2019-02-10 NOTE — Progress Notes (Addendum)
PROGRESS NOTE  Kara Hanson GLO:[id number] DOB: 08-17-1955 DOA: 02/08/2019 PCP: [name], MD  HPI/Recap of past 24 hours:  Kara Hanson is a 63 y.o. female with history of nonischemic cardiomyopathy last EF measured was 45% last month diabetes mellitus type 2, chronic kidney disease stage III, hypertension just discharged home yesterday after being admitted for acute CHF and patient also was recently admitted in April 2020 for Covid pneumonia subsequent which patient also was admitted for community-acquired pneumonia and August 2020 presents to the ER with complaint of sudden onset of worsening shortness of breath since yesterday afternoon on the day after discharge.  Patient also started developing retrosternal chest pressure with no associated fever chills nausea vomiting productive cough.  EMS was called and patient was brought to the ER.  ED Course: In the ER patient was requiring 100% nonrebreather and eventually was placed on BiPAP.  Chest x-ray is consistent with CHF.  EKG shows sinus rhythm with PVCs with nonspecific T wave changes.  Initial troponin was around 25 and repeat 1 was 660.  BNP was 380.  Patient was given Lasix total of 80 mg IV.  Cardiology on-call was consulted requested starting heparin and nitroglycerin infusion.  Patient admitted for acute CHF and possible non-ST relation MI.  COVID-19 test is pending.  Labs show creatinine 1.4 hemoglobin 11.6.   02/10/19: Patient was seen and examined at her bedside this morning.  Elevated troponin with peaked troponin S greater than 3100.  She denies any chest pain or dyspnea.  She is on heparin drip and nitro drip.  Seen by cardiology with plan for RHC/LHC possibly on 02/11/2019 depending on her renal function.  Assessment/Plan: Principal Problem:   Acute respiratory failure with hypoxia (HCC) Active Problems:   Controlled type 2 diabetes mellitus with hyperglycemia (HCC)   Acute on chronic combined systolic and diastolic  CHF (congestive heart failure) (HCC)   CKD (chronic kidney disease), stage III   NSTEMI (non-ST elevated myocardial infarction) ([place])   Acute on chronic hypoxic respiratory failure likely secondary to acute on chronic combined diastolic and systolic CHF.   At baseline she is on 4 L of oxygen by nasal cannula continuously.  Currently on 6 L with O2 saturation 100% on the monitor in the room. Personally reviewed chest x-ray done on admission which showed cardiomegaly with increase in pulmonary vascularity right greater than left suggestive of pulmonary edema.  Continue to maintain O2 saturation greater than 92%  Elevated troponin Troponin is peaked at greater than 3100 and trending down Repeat troponin trend On heparin drip, continue Seen by cardiology with plan for RHC/LHC 02/11/2019 if renal function allows.  Resolved retrosternal chest pressure on nitro drip Currently on nitro drip with improvement of symptoms Continue to closely monitor blood pressure Map is currently greater than 65  Acute on chronic combined systolic and diastolic CHF BNP 188 with 02/10/2019 Last 2D echo with LVEF 45% Continue strict I's and O's and daily weight Ongoing diuresing IV Lasix 40 mg twice daily Management per cardiology Personally reviewed chest x-ray done on 02/10/2019 which show some improvement in pulmonary edema.  CKD 3 Presented with creatinine of 1.3 with GFR 40 Worsening creatinine 1.59 on 02/10/2019 GFR 24 Avoid nephrotoxins and hypotension On IV Lasix 40 mg twice daily Daily BMP and continue to monitor urine output  DM 2 with hyperglycemia A1c 7.8 Continue sensitive insulin sliding scale Avoid hypoglycemia  History of gout on allopurinol.  Chronic normocytic anemia likely from CKD: No sign  of Overt bleeding.  Hemoglobin 8.3 with MCV 89.  Continue to monitor H&H.  History of COVID-19 pneumonia Tested positive on 09/24/2018 Latest COVID-19 test done on 02/09/2019  negative. Afebrile   DVT prophylaxis: Heparin drip Code Status: Full code. Family Communication: Discussed with patient. Disposition Plan:  Possible discharge in the next 48 to 72 hours when cardiology signs off and patient is hemodynamically stable. Consults called: Cardiology.     Objective: Vitals:   02/09/19 2122 02/10/19 0529 02/10/19 0804 02/10/19 0859  BP: 108/70 (!) 92/52 140/64 (!) 143/63  Pulse: 85 79    Resp:  18    Temp:  97.8 F (36.6 C)    TempSrc:  Oral    SpO2: 100% 100% 99% 100%  Weight:  73.6 kg      Intake/Output Summary (Last 24 hours) at 02/10/2019 1014 Last data filed at 02/10/2019 0426 Gross per 24 hour  Intake 1230.27 ml  Output 650 ml  Net 580.27 ml   Filed Weights   02/10/19 0529  Weight: 73.6 kg    Exam:   General: 63 y.o. year-old female well developed well nourished in no acute distress.  Alert and oriented x4.  Cardiovascular: Regular rate and rhythm with no rubs or gallops.  No thyromegaly or JVD noted.    Respiratory: Clear to auscultation with no wheezes or rales. Good inspiratory effort.  Abdomen: Soft nontender nondistended with normal bowel sounds x4 quadrants.  Musculoskeletal: Trace lower extremity edema. 2/4 pulses in all 4 extremities.  Psychiatry: Mood is appropriate for condition and setting   Data Reviewed: CBC: Recent Labs  Lab 02/04/19 0251 02/05/19 0203 02/08/19 2330 02/09/19 0615 02/10/19 0226  WBC 4.7 4.4 9.9 8.9 5.3  NEUTROABS 3.3 3.0 8.8* 7.4 3.8  HGB 9.0* 9.0* 11.6* 9.5* 8.3*  HCT 30.4* 30.8* 37.6 31.6* 27.0*  MCV 91.8 92.5 90.8 90.5 89.7  PLT 253 259 320 284 629   Basic Metabolic Panel: Recent Labs  Lab 02/04/19 0251 02/05/19 0203  02/06/19 0156 02/07/19 0156 02/08/19 2330 02/09/19 0615 02/10/19 0226  NA 137   137 137   < > 135   137 137 140 138 139  K 3.7   3.7 3.9   < > 4.0   4.1 4.1 3.9 4.0 3.7  CL 98   98 100   < > 98   99 100 101 99 102  CO2 _0 < > _1 GLUCOSE 131*   131* 140*   < > 172*   176* 166* 222* 181* 94  BUN 36*   36* 41*   < > 54*   48* 46* 39* 39* 35*  CREATININE 1.93*   1.98* 1.98*   < > 2.12*   2.03* 1.65* 1.41* 1.39* 1.59*  CALCIUM 8.9   8.9 9.0   < > 8.7*   8.8* 9.0 9.7 9.3 9.1  MG 1.8 1.7  --  1.8 1.7  --  2.1 1.9  PHOS 3.6 3.5  --  3.1  --   --   --  3.1   < > = values in this interval not displayed.   GFR: Estimated Creatinine Clearance: 36.3 mL/min (A) (by C-G formula based on SCr of 1.59 mg/dL (H)). Liver Function Tests: Recent Labs  Lab 02/04/19 0251 02/05/19 0203 02/06/19 0156 02/09/19 0615 02/10/19 0226  AST  --   --   --  21 17  ALT  --   --   --  12 10  ALKPHOS  --   --   --  64 52  BILITOT  --   --   --  0.1* 0.2*  PROT  --   --   --  5.8* 4.9*  ALBUMIN 2.6* 2.6* 2.7* 2.6* 2.1*   No results for input(s): LIPASE, AMYLASE in the last 168 hours. No results for input(s): AMMONIA in the last 168 hours. Coagulation Profile: Recent Labs  Lab 02/09/19 0258  INR 1.1   Cardiac Enzymes: No results for input(s): CKTOTAL, CKMB, CKMBINDEX, TROPONINI in the last 168 hours. BNP (last 3 results) No results for input(s): PROBNP in the last 8760 hours. HbA1C: No results for input(s): HGBA1C in the last 72 hours. CBG: Recent Labs  Lab 02/09/19 1755 02/09/19 2103 02/10/19 0028 02/10/19 0401 02/10/19 0741  GLUCAP 117* 207* 127* 70 125*   Lipid Profile: No results for input(s): CHOL, HDL, LDLCALC, TRIG, CHOLHDL, LDLDIRECT in the last 72 hours. Thyroid Function Tests: Recent Labs    02/09/19 0615  TSH 6.624*   Anemia Panel: No results for input(s): VITAMINB12, FOLATE, FERRITIN, TIBC, IRON, RETICCTPCT in the last 72 hours. Urine analysis:    Component Value Date/Time   COLORURINE YELLOW 01/28/2019 0714   APPEARANCEUR TURBID (A) 01/28/2019 0714   LABSPEC 1.015 01/28/2019 0714   PHURINE 5.0 01/28/2019 0714   GLUCOSEU 50 (A) 01/28/2019 0714   HGBUR NEGATIVE 01/28/2019 0714   BILIRUBINUR NEGATIVE  01/28/2019 0714   BILIRUBINUR negative 12/13/2017 [place] 01/28/2019 0714   PROTEINUR >=300 (A) 01/28/2019 0714   UROBILINOGEN 0.2 12/13/2017 1514   NITRITE NEGATIVE 01/28/2019 0714   LEUKOCYTESUR MODERATE (A) 01/28/2019 0714   Sepsis Labs: _0 (procalcitonin:4,lacticidven:4)  ) Recent Results (from the past 240 hour(s))  Culture, respiratory (non-expectorated)     Status: None   Collection Time: 02/01/19  3:43 PM   Specimen: Bronchoalveolar Lavage; Respiratory  Result Value Ref Range Status   Specimen Description   Final    BRONCHIAL ALVEOLAR LAVAGE Performed at [facility], [place] [address] [place], [place] [address]    Special Requests   Final    NONE Performed at [facility], [place] [address], [place], [place] [address]    Gram Stain   Final    ABUNDANT WBC PRESENT,BOTH PMN AND MONONUCLEAR RARE GRAM POSITIVE COCCI    Culture   Final    Consistent with normal respiratory flora. Performed at [facility], [place] [address], [place], [place] [address]    Report Status 02/04/2019 FINAL  Final  MRSA PCR Screening     Status: None   Collection Time: 02/01/19  4:00 PM   Specimen: Nasal Mucosa; Nasopharyngeal  Result Value Ref Range Status   MRSA by PCR NEGATIVE NEGATIVE Final    Comment:        The GeneXpert MRSA Assay (FDA approved for NASAL specimens only), is one component of a comprehensive MRSA colonization surveillance program. It is not intended to diagnose MRSA infection nor to guide or monitor treatment for MRSA infections. Performed at [facility], [place] [address], [place], [place] [address]   SARS CORONAVIRUS 2 (TAT 6-24 HRS) Nasopharyngeal Nasopharyngeal Swab     Status: None   Collection Time: 02/09/19  3:00 AM   Specimen: Nasopharyngeal Swab  Result Value Ref Range Status   SARS Coronavirus 2 NEGATIVE NEGATIVE Final    Comment: (NOTE) SARS-CoV-2 target nucleic  acids are NOT DETECTED. The SARS-CoV-2  RNA is generally detectable in upper and lower respiratory specimens during the acute phase of infection. Negative results do not preclude SARS-CoV-2 infection, do not rule out co-infections with other pathogens, and should not be used as the sole basis for treatment or other patient management decisions. Negative results must be combined with clinical observations, patient history, and epidemiological information. The expected result is Negative. Fact Sheet for Patients: SugarRoll.be Fact Sheet for Healthcare Providers: https://www.woods-mathews.com/ This test is not yet approved or cleared by the [place] FDA and  has been authorized for detection and/or diagnosis of SARS-CoV-2 by FDA under an Emergency Use Authorization (EUA). This EUA will remain  in effect (meaning this test can be used) for the duration of the COVID-19 declaration under Section 56 4(b)(1) of the Act, 21 U.S.C. section 360bbb-3(b)(1), unless the authorization is terminated or revoked sooner. Performed at [facility], [place] [address], [place], [place] [address]       Studies: No results found.  Scheduled Meds:  allopurinol  150 mg Oral Daily   aspirin EC  81 mg Oral Daily   atorvastatin  80 mg Oral q1800   carvedilol  25 mg Oral BID WC   DULoxetine  30 mg Oral Daily   furosemide  40 mg Intravenous Q12H   gabapentin  100 mg Oral BID   insulin aspart  0-9 Units Subcutaneous Q4H   insulin glargine  35 Units Subcutaneous QHS   isosorbide-hydrALAZINE  1 tablet Oral TID   pantoprazole  20 mg Oral Daily    Continuous Infusions:  heparin 1,050 Units/hr (02/09/19 1916)   nitroGLYCERIN 25 mcg/min (02/09/19 0937)     LOS: 1 day     Kayleen Memos, MD [facility] Pager [phone]  If 7PM-7AM, please contact night-coverage [email or web address] Password [email or web address] 02/10/2019, 10:14 AM

## 2019-02-10 NOTE — Progress Notes (Signed)
ANTICOAGULATION CONSULT NOTE - Follow Up Consult  Pharmacy Consult for Heparin Indication: chest pain/ACS  Allergies  Allergen Reactions  . Garlic Shortness Of Breath, Itching and Swelling    Hand itching and swelling  . Latex Itching  . Morphine And Related Itching and Other (See Comments)    Headache   . Other Itching    Reaction to newspaper ink -itching and headache    Patient Measurements:   Height: 55 inches Weight: 77 kg  Heparin Dosing Weight: 73 kg  Vital Signs: Temp: 98.4 F (36.9 C) (11/07 1700) Temp Source: Axillary (11/07 1700) BP: 108/70 (11/07 2122) Pulse Rate: 85 (11/07 2122)  Labs: Recent Labs    02/08/19 2330 02/09/19 0114 02/09/19 0258 02/09/19 0615 02/09/19 1300 02/10/19 0226  HGB 11.6*  --   --  9.5*  --  8.3*  HCT 37.6  --   --  31.6*  --  27.0*  PLT 320  --   --  284  --  234  APTT  --   --  33  --   --   --   LABPROT  --   --  14.1  --   --   --   INR  --   --  1.1  --   --   --   HEPARINUNFRC  --   --   --   --  0.73* 0.59  CREATININE 1.41*  --   --  1.39*  --  1.59*  TROPONINIHS 25* 660*  --  3,166*  --   --     Estimated Creatinine Clearance: 37.1 mL/min (A) (by C-G formula based on SCr of 1.59 mg/dL (H)).   Medications:  Scheduled:  . allopurinol  150 mg Oral Daily  . aspirin EC  81 mg Oral Daily  . atorvastatin  80 mg Oral q1800  . carvedilol  25 mg Oral BID WC  . DULoxetine  30 mg Oral Daily  . furosemide  40 mg Intravenous Q12H  . gabapentin  100 mg Oral BID  . insulin aspart  0-9 Units Subcutaneous Q4H  . insulin glargine  35 Units Subcutaneous QHS  . isosorbide-hydrALAZINE  1 tablet Oral TID  . pantoprazole  20 mg Oral Daily   Infusions:  . heparin 1,050 Units/hr (02/09/19 1916)  . nitroGLYCERIN 25 mcg/min (02/09/19 1638)    Assessment: 63 yo F transferred from [facility] to [facility] for cards eval.  Pt complains of sudden onset of SOB and retrosternal pressure.    Pt started on heparin at [facility] prior to transfer.  Heparin  level is slightly above goal on 1150 units/hr.  No bleeding noted.  11/8 AM update:  Heparin level therapeutic x 1 after rate decrease  Goal of Therapy:  Heparin level 0.3-0.7 units/ml Monitor platelets by anticoagulation protocol: Yes   Plan:  Cont heparin at 1050 units/hr Confirmatory heparin level at [place], PharmD, [place] Pharmacist Phone: [phone]

## 2019-02-11 ENCOUNTER — Encounter (HOSPITAL_COMMUNITY)
Admission: EM | Disposition: A | Discharge status: Home Health | Payer: Self-pay | Source: Home / Self Care | Attending: Internal Medicine

## 2019-02-11 ENCOUNTER — Ambulatory Visit: Payer: Medicare Other | Attending: Nurse Practitioner | Admitting: Nurse Practitioner

## 2019-02-11 ENCOUNTER — Encounter (HOSPITAL_COMMUNITY): Payer: Self-pay | Admitting: Internal Medicine

## 2019-02-11 DIAGNOSIS — I429 Cardiomyopathy, unspecified: Secondary | ICD-10-CM

## 2019-02-11 DIAGNOSIS — I5021 Acute systolic (congestive) heart failure: Secondary | ICD-10-CM

## 2019-02-11 DIAGNOSIS — I5041 Acute combined systolic (congestive) and diastolic (congestive) heart failure: Secondary | ICD-10-CM

## 2019-02-11 DIAGNOSIS — N183 Chronic kidney disease, stage 3 unspecified: Secondary | ICD-10-CM

## 2019-02-11 DIAGNOSIS — I2510 Atherosclerotic heart disease of native coronary artery without angina pectoris: Secondary | ICD-10-CM

## 2019-02-11 HISTORY — PX: CARDIAC CATHETERIZATION: SHX172

## 2019-02-11 HISTORY — PX: RIGHT/LEFT HEART CATH AND CORONARY ANGIOGRAPHY: CATH118266

## 2019-02-11 LAB — POCT I-STAT EG7
Acid-Base Excess: 1 mmol/L (ref 0.0–2.0)
Acid-Base Excess: 5 mmol/L — ABNORMAL HIGH (ref 0.0–2.0)
Bicarbonate: 26.6 mmol/L (ref 20.0–28.0)
Bicarbonate: 30.9 mmol/L — ABNORMAL HIGH (ref 20.0–28.0)
Calcium, Ion: 0.99 mmol/L — ABNORMAL LOW (ref 1.15–1.40)
Calcium, Ion: 1.28 mmol/L (ref 1.15–1.40)
HCT: 22 % — ABNORMAL LOW (ref 36.0–46.0)
HCT: 25 % — ABNORMAL LOW (ref 36.0–46.0)
Hemoglobin: 7.5 g/dL — ABNORMAL LOW (ref 12.0–15.0)
Hemoglobin: 8.5 g/dL — ABNORMAL LOW (ref 12.0–15.0)
O2 Saturation: 62 %
O2 Saturation: 63 %
Potassium: 3.4 mmol/L — ABNORMAL LOW (ref 3.5–5.1)
Potassium: 4.1 mmol/L (ref 3.5–5.1)
Sodium: 137 mmol/L (ref 135–145)
Sodium: 144 mmol/L (ref 135–145)
TCO2: 28 mmol/L (ref 22–32)
TCO2: 32 mmol/L (ref 22–32)
pCO2, Ven: 48.8 mmHg (ref 44.0–60.0)
pCO2, Ven: 54.4 mmHg (ref 44.0–60.0)
pH, Ven: 7.345 (ref 7.250–7.430)
pH, Ven: 7.362 (ref 7.250–7.430)
pO2, Ven: 34 mmHg (ref 32.0–45.0)
pO2, Ven: 35 mmHg (ref 32.0–45.0)

## 2019-02-11 LAB — GLUCOSE, CAPILLARY
Glucose-Capillary: 100 mg/dL — ABNORMAL HIGH (ref 70–99)
Glucose-Capillary: 116 mg/dL — ABNORMAL HIGH (ref 70–99)
Glucose-Capillary: 65 mg/dL — ABNORMAL LOW (ref 70–99)
Glucose-Capillary: 65 mg/dL — ABNORMAL LOW (ref 70–99)
Glucose-Capillary: 66 mg/dL — ABNORMAL LOW (ref 70–99)
Glucose-Capillary: 69 mg/dL — ABNORMAL LOW (ref 70–99)
Glucose-Capillary: 74 mg/dL (ref 70–99)
Glucose-Capillary: 86 mg/dL (ref 70–99)
Glucose-Capillary: 88 mg/dL (ref 70–99)
Glucose-Capillary: 92 mg/dL (ref 70–99)

## 2019-02-11 LAB — BASIC METABOLIC PANEL
Anion gap: 9 (ref 5–15)
BUN: 38 mg/dL — ABNORMAL HIGH (ref 8–23)
CO2: 29 mmol/L (ref 22–32)
Calcium: 9.3 mg/dL (ref 8.9–10.3)
Chloride: 99 mmol/L (ref 98–111)
Creatinine, Ser: 1.88 mg/dL — ABNORMAL HIGH (ref 0.44–1.00)
GFR calc Af Amer: 32 mL/min — ABNORMAL LOW (ref >60)
GFR calc non Af Amer: 28 mL/min — ABNORMAL LOW (ref >60)
Glucose, Bld: 86 mg/dL (ref 70–99)
Potassium: 4.5 mmol/L (ref 3.5–5.1)
Sodium: 137 mmol/L (ref 135–145)

## 2019-02-11 LAB — CBC
HCT: 26.7 % — ABNORMAL LOW (ref 36.0–46.0)
Hemoglobin: 8.2 g/dL — ABNORMAL LOW (ref 12.0–15.0)
MCH: 27.3 pg (ref 26.0–34.0)
MCHC: 30.7 g/dL (ref 30.0–36.0)
MCV: 89 fL (ref 80.0–100.0)
Platelets: 214 10*3/uL (ref 150–400)
RBC: 3 MIL/uL — ABNORMAL LOW (ref 3.87–5.11)
RDW: 14.6 % (ref 11.5–15.5)
WBC: 4.9 10*3/uL (ref 4.0–10.5)
nRBC: 0 % (ref 0.0–0.2)

## 2019-02-11 LAB — POCT I-STAT 7, (LYTES, BLD GAS, ICA,H+H)
Acid-Base Excess: 5 mmol/L — ABNORMAL HIGH (ref 0.0–2.0)
Bicarbonate: 30.6 mmol/L — ABNORMAL HIGH (ref 20.0–28.0)
Calcium, Ion: 1.3 mmol/L (ref 1.15–1.40)
HCT: 26 % — ABNORMAL LOW (ref 36.0–46.0)
Hemoglobin: 8.8 g/dL — ABNORMAL LOW (ref 12.0–15.0)
O2 Saturation: 98 %
Potassium: 4.2 mmol/L (ref 3.5–5.1)
Sodium: 136 mmol/L (ref 135–145)
TCO2: 32 mmol/L (ref 22–32)
pCO2 arterial: 49.1 mmHg — ABNORMAL HIGH (ref 32.0–48.0)
pH, Arterial: 7.402 (ref 7.350–7.450)
pO2, Arterial: 109 mmHg — ABNORMAL HIGH (ref 83.0–108.0)

## 2019-02-11 LAB — HEPARIN LEVEL (UNFRACTIONATED): Heparin Unfractionated: 0.63 IU/mL (ref 0.30–0.70)

## 2019-02-11 SURGERY — RIGHT/LEFT HEART CATH AND CORONARY ANGIOGRAPHY
Anesthesia: LOCAL

## 2019-02-11 MED ORDER — SODIUM CHLORIDE 0.9% FLUSH: 3.0000 mL | INTRAVENOUS | Status: DC | PRN

## 2019-02-11 MED ORDER — ACETAMINOPHEN 325 MG PO TABS: 650.0000 mg | ORAL_TABLET | ORAL | Status: DC | PRN

## 2019-02-11 MED ORDER — VERAPAMIL HCL 2.5 MG/ML IV SOLN
INTRAVENOUS | Status: DC | PRN
  Administered 2019-02-11: 10 mL via INTRA_ARTERIAL

## 2019-02-11 MED ORDER — INSULIN GLARGINE 100 UNIT/ML ~~LOC~~ SOLN
35.0000 [IU] | Freq: Every day | SUBCUTANEOUS | Status: DC
  Administered 2019-02-12 – 2019-02-13 (×2): 35 [IU] via SUBCUTANEOUS
  Filled 2019-02-11 (×3): qty 0.35

## 2019-02-11 MED ORDER — HEPARIN (PORCINE) IN NACL 1000-0.9 UT/500ML-% IV SOLN
INTRAVENOUS | Status: DC | PRN
  Administered 2019-02-11: 500 mL

## 2019-02-11 MED ORDER — LIDOCAINE HCL (PF) 1 % IJ SOLN
INTRAMUSCULAR | Status: DC | PRN
  Administered 2019-02-11: 3 mL
  Administered 2019-02-11: 1 mL

## 2019-02-11 MED ORDER — IOHEXOL 350 MG/ML SOLN
INTRAVENOUS | Status: DC | PRN
  Administered 2019-02-11: 30 mL

## 2019-02-11 MED ORDER — LABETALOL HCL 5 MG/ML IV SOLN: 10.0000 mg | INTRAVENOUS | Status: AC | PRN

## 2019-02-11 MED ORDER — HEPARIN SODIUM (PORCINE) 1000 UNIT/ML IJ SOLN
INTRAMUSCULAR | Status: AC
  Filled 2019-02-11: qty 1

## 2019-02-11 MED ORDER — INSULIN GLARGINE 100 UNIT/ML ~~LOC~~ SOLN
20.0000 [IU] | Freq: Once | SUBCUTANEOUS | Status: AC
  Administered 2019-02-11: 20 [IU] via SUBCUTANEOUS
  Filled 2019-02-11: qty 0.2

## 2019-02-11 MED ORDER — HEPARIN (PORCINE) IN NACL 1000-0.9 UT/500ML-% IV SOLN
INTRAVENOUS | Status: AC
  Filled 2019-02-11: qty 1000

## 2019-02-11 MED ORDER — ONDANSETRON HCL 4 MG/2ML IJ SOLN: 4.0000 mg | Freq: Four times a day (QID) | INTRAMUSCULAR | Status: DC | PRN

## 2019-02-11 MED ORDER — HYDRALAZINE HCL 20 MG/ML IJ SOLN: 10.0000 mg | INTRAMUSCULAR | Status: AC | PRN

## 2019-02-11 MED ORDER — MIDAZOLAM HCL 2 MG/2ML IJ SOLN
INTRAMUSCULAR | Status: AC
  Filled 2019-02-11: qty 2

## 2019-02-11 MED ORDER — SODIUM CHLORIDE 0.9 % IV SOLN: INTRAVENOUS | Status: AC

## 2019-02-11 MED ORDER — ENOXAPARIN SODIUM 40 MG/0.4ML ~~LOC~~ SOLN
40.0000 mg | SUBCUTANEOUS | Status: DC
  Administered 2019-02-12 – 2019-02-13 (×2): 40 mg via SUBCUTANEOUS
  Filled 2019-02-11 (×2): qty 0.4

## 2019-02-11 MED ORDER — DEXTROSE 50 % IV SOLN
INTRAVENOUS | Status: AC
  Filled 2019-02-11: qty 50

## 2019-02-11 MED ORDER — HEPARIN SODIUM (PORCINE) 1000 UNIT/ML IJ SOLN
INTRAMUSCULAR | Status: DC | PRN
  Administered 2019-02-11: 4000 [IU] via INTRAVENOUS

## 2019-02-11 MED ORDER — MIDAZOLAM HCL 2 MG/2ML IJ SOLN
INTRAMUSCULAR | Status: DC | PRN
  Administered 2019-02-11: 0.5 mg via INTRAVENOUS

## 2019-02-11 MED ORDER — SODIUM CHLORIDE 0.9 % IV SOLN: 250.0000 mL | INTRAVENOUS | Status: DC | PRN

## 2019-02-11 MED ORDER — VERAPAMIL HCL 2.5 MG/ML IV SOLN
INTRAVENOUS | Status: AC
  Filled 2019-02-11: qty 2

## 2019-02-11 MED ORDER — SODIUM CHLORIDE 0.9 % IV SOLN: INTRAVENOUS | Status: DC

## 2019-02-11 MED ORDER — LIDOCAINE HCL (PF) 1 % IJ SOLN
INTRAMUSCULAR | Status: AC
  Filled 2019-02-11: qty 30

## 2019-02-11 MED ORDER — DEXTROSE 50 % IV SOLN
INTRAVENOUS | Status: AC
  Administered 2019-02-11: 50 mL
  Filled 2019-02-11: qty 50

## 2019-02-11 MED ORDER — SODIUM CHLORIDE 0.9% FLUSH
3.0000 mL | Freq: Two times a day (BID) | INTRAVENOUS | Status: DC
  Administered 2019-02-11 – 2019-02-17 (×6): 3 mL via INTRAVENOUS

## 2019-02-11 MED ORDER — SODIUM CHLORIDE 0.9% FLUSH
3.0000 mL | Freq: Two times a day (BID) | INTRAVENOUS | Status: DC
  Administered 2019-02-12 – 2019-02-18 (×10): 3 mL via INTRAVENOUS

## 2019-02-11 MED ORDER — ASPIRIN 81 MG PO CHEW: 81.0000 mg | CHEWABLE_TABLET | ORAL | Status: DC

## 2019-02-11 MED ORDER — SODIUM CHLORIDE 0.9 % IV SOLN
INTRAVENOUS | Status: DC
  Administered 2019-02-11: 11:00:00 via INTRAVENOUS

## 2019-02-11 SURGICAL SUPPLY — 12 items
CATH 5FR JL3.5 JR4 ANG PIG MP (CATHETERS) ×2 IMPLANT
CATH BALLN WEDGE 5F 110CM (CATHETERS) ×2 IMPLANT
DEVICE RAD COMP TR BAND LRG (VASCULAR PRODUCTS) ×2 IMPLANT
GLIDESHEATH SLEND SS 6F .021 (SHEATH) ×2 IMPLANT
GUIDEWIRE .025 260CM (WIRE) ×2 IMPLANT
GUIDEWIRE INQWIRE 1.5J.035X260 (WIRE) ×1 IMPLANT
INQWIRE 1.5J .035X260CM (WIRE) ×2
PACK CARDIAC CATHETERIZATION (CUSTOM PROCEDURE TRAY) ×2 IMPLANT
SHEATH GLIDE SLENDER 4/5FR (SHEATH) ×2 IMPLANT
SHEATH PROBE COVER 6X72 (BAG) ×2 IMPLANT
TRANSDUCER W/STOPCOCK (MISCELLANEOUS) ×2 IMPLANT
WIRE MICROINTRODUCER 60CM (WIRE) ×2 IMPLANT

## 2019-02-11 NOTE — Interval H&P Note (Signed)
History and Physical Interval Note:  02/11/2019 11:05 AM  Kara Hanson  has presented today for surgery, with the diagnosis of heart failure.  The various methods of treatment have been discussed with the patient and family. After consideration of risks, benefits and other options for treatment, the patient has consented to  Procedure(s): RIGHT/LEFT HEART CATH AND CORONARY ANGIOGRAPHY (N/A) and possible coronary angioplasty as a surgical intervention.  The patient's history has been reviewed, patient examined, no change in status, stable for surgery.  I have reviewed the patient's chart and labs.  Questions were answered to the patient's satisfaction.     Daniel Bensimhon

## 2019-02-11 NOTE — Progress Notes (Signed)
ANTICOAGULATION CONSULT NOTE - Follow Up Consult  Pharmacy Consult for Heparin Indication: chest pain/ACS  Allergies  Allergen Reactions  . Garlic Shortness Of Breath, Itching and Swelling    Hand itching and swelling  . Latex Itching  . Morphine And Related Itching and Other (See Comments)    Headache   . Other Itching    Reaction to newspaper ink -itching and headache    Patient Measurements: Weight: 169 lb 15.6 oz (77.1 kg) Height: 55 inches Weight: 77 kg  Heparin Dosing Weight: 73 kg  Vital Signs: Temp: 98.1 F (36.7 C) (11/09 0428) Temp Source: Oral (11/09 0428) BP: 151/71 (11/09 0828) Pulse Rate: 74 (11/09 0828)  Labs: Recent Labs    02/09/19 0114 02/09/19 0258 02/09/19 0615  02/10/19 0226 02/10/19 0919 02/10/19 1100 02/10/19 1802 02/11/19 0359 02/11/19 0531  HGB  --   --  9.5*  --  8.3*  --   --   --  8.2*  --   HCT  --   --  31.6*  --  27.0*  --   --   --  26.7*  --   PLT  --   --  284  --  234  --   --   --  214  --   APTT  --  33  --   --   --   --   --   --   --   --   LABPROT  --  14.1  --   --   --   --   --   --   --   --   INR  --  1.1  --   --   --   --   --   --   --   --   HEPARINUNFRC  --   --   --    < > 0.59  --  0.72* 0.47 0.63  --   CREATININE  --   --  1.39*  --  1.59*  --   --   --   --  1.88*  TROPONINIHS 660*  --  3,166*  --   --  1,015*  --   --   --   --    < > = values in this interval not displayed.    Estimated Creatinine Clearance: 31.4 mL/min (A) (by C-G formula based on SCr of 1.88 mg/dL (H)).   Medications:  Scheduled:  . allopurinol  150 mg Oral Daily  . aspirin  81 mg Oral Pre-Cath  . aspirin EC  81 mg Oral Daily  . atorvastatin  80 mg Oral q1800  . carvedilol  25 mg Oral BID WC  . DULoxetine  30 mg Oral Daily  . furosemide  40 mg Intravenous Q12H  . gabapentin  100 mg Oral BID  . insulin aspart  0-9 Units Subcutaneous Q4H  . insulin glargine  35 Units Subcutaneous QHS  . isosorbide-hydrALAZINE  1 tablet Oral  TID  . pantoprazole  20 mg Oral Daily  . sodium chloride flush  3 mL Intravenous Q12H   Infusions:  . sodium chloride    . sodium chloride    . heparin 1,000 Units/hr (02/10/19 2040)    Assessment: 63 yo F presented to [facility] ED with complaints of sudden onset of SOB and retrosternal pressure and was transferred from [facility] ED to [facility] for cardiology evaluation. Pharmacy dosing heparin for ACS. PLans for cath today -heparin level at  goal, hg= 8.2  Goal of Therapy:  Heparin level 0.3-0.7 units/ml Monitor platelets by anticoagulation protocol: Yes   Plan:  Continue heparin at 1000 units/hr Will follow plans post cath  Hildred Laser, PharmD Clinical Pharmacist **Pharmacist phone directory can now be found on [email or web address] ([email or web address]).  Listed under [place].

## 2019-02-11 NOTE — Progress Notes (Signed)
Progress Note  Patient Name: Kara Hanson Date of Encounter: 02/11/2019  Primary Cardiologist: Candee Furbish, MD   Subjective   No chest pain; dyspnea improving  Inpatient Medications    Scheduled Meds: . allopurinol  150 mg Oral Daily  . aspirin EC  81 mg Oral Daily  . atorvastatin  80 mg Oral q1800  . carvedilol  25 mg Oral BID WC  . DULoxetine  30 mg Oral Daily  . furosemide  40 mg Intravenous Q12H  . gabapentin  100 mg Oral BID  . insulin aspart  0-9 Units Subcutaneous Q4H  . insulin glargine  35 Units Subcutaneous QHS  . isosorbide-hydrALAZINE  1 tablet Oral TID  . pantoprazole  20 mg Oral Daily   Continuous Infusions: . heparin 1,000 Units/hr (02/10/19 2040)  . nitroGLYCERIN 5 mcg/min (02/10/19 2046)   PRN Meds: acetaminophen **OR** acetaminophen, benzonatate, bisacodyl, ondansetron **OR** ondansetron (ZOFRAN) IV, polyethylene glycol   Vital Signs    Vitals:   02/11/19 0000 02/11/19 0428 02/11/19 0628 02/11/19 0828  BP: 125/61 135/60  (!) 151/71  Pulse:    74  Resp: 16 16  16   Temp: 98.5 F (36.9 C) 98.1 F (36.7 C)    TempSrc: Oral Oral    SpO2: 97% 99%  99%  Weight:   77.1 kg     Intake/Output Summary (Last 24 hours) at 02/11/2019 0910 Last data filed at 02/10/2019 2300 Gross per 24 hour  Intake 120 ml  Output 650 ml  Net -530 ml   Last 3 Weights 02/11/2019 02/10/2019 02/07/2019  Weight (lbs) 169 lb 15.6 oz 162 lb 3.2 oz 169 lb 15.6 oz  Weight (kg) 77.1 kg 73.573 kg 77.1 kg      Telemetry    Sinus with 3 beats NSVT- Personally Reviewed  Physical Exam   GEN: No acute distress.   Neck: supple Cardiac: RRR, 2/6 systolic murmur Respiratory: Diminished BS bases GI: Soft, nontender, non-distended  MS: No edema Neuro:  Nonfocal  Psych: Normal affect   Labs    High Sensitivity Troponin:   Recent Labs  Lab 01/23/19 2034 02/08/19 2330 02/09/19 0114 02/09/19 0615 02/10/19 0919  TROPONINIHS 13 25* 660* 3,166* 1,015*      Chemistry  Recent Labs  Lab 02/06/19 0156  02/09/19 0615 02/10/19 0226 02/11/19 0531  NA 135  137   < > 138 139 137  K 4.0  4.1   < > 4.0 3.7 4.5  CL 98  99   < > 99 102 99  CO2 30  30   < > 29 29 29   GLUCOSE 172*  176*   < > 181* 94 86  BUN 54*  48*   < > 39* 35* 38*  CREATININE 2.12*  2.03*   < > 1.39* 1.59* 1.88*  CALCIUM 8.7*  8.8*   < > 9.3 9.1 9.3  PROT  --   --  5.8* 4.9*  --   ALBUMIN 2.7*  --  2.6* 2.1*  --   AST  --   --  21 17  --   ALT  --   --  12 10  --   ALKPHOS  --   --  64 52  --   BILITOT  --   --  0.1* 0.2*  --   GFRNONAA 24*  25*   < > 40* 34* 28*  GFRAA 28*  29*   < > 46* 39* 32*  ANIONGAP 7  8   < >  10 8 9    < > = values in this interval not displayed.     Hematology Recent Labs  Lab 02/09/19 0615 02/10/19 0226 02/11/19 0359  WBC 8.9 5.3 4.9  RBC 3.49* 3.01* 3.00*  HGB 9.5* 8.3* 8.2*  HCT 31.6* 27.0* 26.7*  MCV 90.5 89.7 89.0  MCH 27.2 27.6 27.3  MCHC 30.1 30.7 30.7  RDW 14.7 14.7 14.6  PLT 284 234 214    BNP Recent Labs  Lab 02/08/19 2330 02/10/19 0700  BNP 380.8* 379.3*     Radiology    Dg Chest Port 1 View  Result Date: 02/10/2019 CLINICAL DATA:  Shortness of breath EXAM: PORTABLE CHEST 1 VIEW COMPARISON:  02/08/2019, 02/03/2019, 02/01/2019 FINDINGS: Bilateral interstitial and patchy alveolar airspace opacities. Trace bilateral pleural effusions. No pneumothorax. Stable cardiomegaly. No acute osseous abnormality. IMPRESSION: Findings most compatible with improving CHF. Electronically Signed   By: Kathreen Devoid   On: 02/10/2019 14:10    Patient Profile     63 y.o. female with past medical history of stress-induced cardiomyopathy, diabetes mellitus, hypertension, chronic renal insufficiency admitted with recurrent congestive heart failure and elevated troponin.  Echocardiogram shows ejection fraction 40 to 45%, moderate left ventricular hypertrophy, apical hypokinesis, mild mitral regurgitation.  Follow-up echocardiogram this  admission shows ejection fraction 30 to 35%, mild aortic stenosis (peak velocity of 2 m/s in setting of reduced LV function, no gradient reported), mild mitral regurgitation and moderate tricuspid regurgitation.  Assessment & Plan   1 acute combined systolic/diastolic congestive heart failure-volume status is improving.  We will continue present dose of diuretics.  Follow renal function closely.  2 cardiomyopathy-etiology unclear.  Previous catheterization in 2018 reportedly showed normal coronary arteries.  Will continue beta-blocker, hydralazine and nitrates.  Discontinue IV nitroglycerin.  Will not add an ARB or Entresto at this point given renal insufficiency.  Given worsening LV function and elevated troponin plan is for right and left cardiac catheterization today.  The risks and benefits including myocardial infarction, CVA and death discussed and she agrees to proceed.  We also discussed the possibility of worsening renal insufficiency/contrast nephropathy.  3 chronic stage III kidney disease-follow renal function closely after procedure.  Limit dye.  No ventriculogram.  4 question aortic stenosis-not well evaluated on recent echocardiogram.  S2 is not diminished on examination.  We will plan follow-up echoes in the future.  5 normocytic anemia-possibly secondary to renal insufficiency.  Per primary care.   For questions or updates, please contact [place] Please consult [email or web address] for contact info under        Signed, Kirk Ruths, MD  02/11/2019, 9:10 AM

## 2019-02-11 NOTE — Progress Notes (Signed)
ANTICOAGULATION CONSULT NOTE - Follow Up Consult  Pharmacy Consult for Heparin Indication: chest pain/ACS  Allergies  Allergen Reactions  . Garlic Shortness Of Breath, Itching and Swelling    Hand itching and swelling  . Latex Itching  . Morphine And Related Itching and Other (See Comments)    Headache   . Other Itching    Reaction to newspaper ink -itching and headache    Patient Measurements: Weight: 169 lb 15.6 oz (77.1 kg) Height: 55 inches Weight: 77 kg  Heparin Dosing Weight: 73 kg  Vital Signs: Temp: 98.1 F (36.7 C) (11/09 0428) Temp Source: Oral (11/09 0428) BP: 151/71 (11/09 0828) Pulse Rate: 74 (11/09 0828)  Labs: Recent Labs    02/09/19 0114 02/09/19 0258 02/09/19 0615  02/10/19 0226 02/10/19 0919 02/10/19 1100 02/10/19 1802 02/11/19 0359 02/11/19 0531  HGB  --   --  9.5*  --  8.3*  --   --   --  8.2*  --   HCT  --   --  31.6*  --  27.0*  --   --   --  26.7*  --   PLT  --   --  284  --  234  --   --   --  214  --   APTT  --  33  --   --   --   --   --   --   --   --   LABPROT  --  14.1  --   --   --   --   --   --   --   --   INR  --  1.1  --   --   --   --   --   --   --   --   HEPARINUNFRC  --   --   --    < > 0.59  --  0.72* 0.47 0.63  --   CREATININE  --   --  1.39*  --  1.59*  --   --   --   --  1.88*  TROPONINIHS 660*  --  3,166*  --   --  1,015*  --   --   --   --    < > = values in this interval not displayed.    Estimated Creatinine Clearance: 31.4 mL/min (A) (by C-G formula based on SCr of 1.88 mg/dL (H)).   Medications:  Scheduled:  . allopurinol  150 mg Oral Daily  . aspirin  81 mg Oral Pre-Cath  . aspirin EC  81 mg Oral Daily  . atorvastatin  80 mg Oral q1800  . carvedilol  25 mg Oral BID WC  . DULoxetine  30 mg Oral Daily  . furosemide  40 mg Intravenous Q12H  . gabapentin  100 mg Oral BID  . insulin aspart  0-9 Units Subcutaneous Q4H  . insulin glargine  35 Units Subcutaneous QHS  . isosorbide-hydrALAZINE  1 tablet Oral  TID  . pantoprazole  20 mg Oral Daily  . sodium chloride flush  3 mL Intravenous Q12H   Infusions:  . sodium chloride    . sodium chloride    . heparin 1,000 Units/hr (02/10/19 2040)    Assessment: 63 yo F presented to [facility] ED with complaints of sudden onset of SOB and retrosternal pressure and was transferred from [facility] ED to [facility] for cardiology evaluation. Pharmacy consulted to dose heparin for ACS. Plans noted for cath today.  Heparin level at goal. CBC stable.  Goal of Therapy:  Heparin level 0.3-0.7 units/ml Monitor platelets by anticoagulation protocol: Yes   Plan:  Continue heparin at 1000 units/hr Will follow plans post-cath.  Beckey Rutter PharmD Candidate [place] of Pharmacy

## 2019-02-11 NOTE — H&P (View-Only) (Signed)
Progress Note  Patient Name: Annis Lagoy Date of Encounter: 02/11/2019  Primary Cardiologist: Candee Furbish, MD   Subjective   No chest pain; dyspnea improving  Inpatient Medications    Scheduled Meds: . allopurinol  150 mg Oral Daily  . aspirin EC  81 mg Oral Daily  . atorvastatin  80 mg Oral q1800  . carvedilol  25 mg Oral BID WC  . DULoxetine  30 mg Oral Daily  . furosemide  40 mg Intravenous Q12H  . gabapentin  100 mg Oral BID  . insulin aspart  0-9 Units Subcutaneous Q4H  . insulin glargine  35 Units Subcutaneous QHS  . isosorbide-hydrALAZINE  1 tablet Oral TID  . pantoprazole  20 mg Oral Daily   Continuous Infusions: . heparin 1,000 Units/hr (02/10/19 2040)  . nitroGLYCERIN 5 mcg/min (02/10/19 2046)   PRN Meds: acetaminophen **OR** acetaminophen, benzonatate, bisacodyl, ondansetron **OR** ondansetron (ZOFRAN) IV, polyethylene glycol   Vital Signs    Vitals:   02/11/19 0000 02/11/19 0428 02/11/19 0628 02/11/19 0828  BP: 125/61 135/60  (!) 151/71  Pulse:    74  Resp: 16 16  16   Temp: 98.5 F (36.9 C) 98.1 F (36.7 C)    TempSrc: Oral Oral    SpO2: 97% 99%  99%  Weight:   77.1 kg     Intake/Output Summary (Last 24 hours) at 02/11/2019 0910 Last data filed at 02/10/2019 2300 Gross per 24 hour  Intake 120 ml  Output 650 ml  Net -530 ml   Last 3 Weights 02/11/2019 02/10/2019 02/07/2019  Weight (lbs) 169 lb 15.6 oz 162 lb 3.2 oz 169 lb 15.6 oz  Weight (kg) 77.1 kg 73.573 kg 77.1 kg      Telemetry    Sinus with 3 beats NSVT- Personally Reviewed  Physical Exam   GEN: No acute distress.   Neck: supple Cardiac: RRR, 2/6 systolic murmur Respiratory: Diminished BS bases GI: Soft, nontender, non-distended  MS: No edema Neuro:  Nonfocal  Psych: Normal affect   Labs    High Sensitivity Troponin:   Recent Labs  Lab 01/23/19 2034 02/08/19 2330 02/09/19 0114 02/09/19 0615 02/10/19 0919  TROPONINIHS 13 25* 660* 3,166* 1,015*      Chemistry  Recent Labs  Lab 02/06/19 0156  02/09/19 0615 02/10/19 0226 02/11/19 0531  NA 135  137   < > 138 139 137  K 4.0  4.1   < > 4.0 3.7 4.5  CL 98  99   < > 99 102 99  CO2 30  30   < > 29 29 29   GLUCOSE 172*  176*   < > 181* 94 86  BUN 54*  48*   < > 39* 35* 38*  CREATININE 2.12*  2.03*   < > 1.39* 1.59* 1.88*  CALCIUM 8.7*  8.8*   < > 9.3 9.1 9.3  PROT  --   --  5.8* 4.9*  --   ALBUMIN 2.7*  --  2.6* 2.1*  --   AST  --   --  21 17  --   ALT  --   --  12 10  --   ALKPHOS  --   --  64 52  --   BILITOT  --   --  0.1* 0.2*  --   GFRNONAA 24*  25*   < > 40* 34* 28*  GFRAA 28*  29*   < > 46* 39* 32*  ANIONGAP 7  8   < >  10 8 9    < > = values in this interval not displayed.     Hematology Recent Labs  Lab 02/09/19 0615 02/10/19 0226 02/11/19 0359  WBC 8.9 5.3 4.9  RBC 3.49* 3.01* 3.00*  HGB 9.5* 8.3* 8.2*  HCT 31.6* 27.0* 26.7*  MCV 90.5 89.7 89.0  MCH 27.2 27.6 27.3  MCHC 30.1 30.7 30.7  RDW 14.7 14.7 14.6  PLT 284 234 214    BNP Recent Labs  Lab 02/08/19 2330 02/10/19 0700  BNP 380.8* 379.3*     Radiology    Dg Chest Port 1 View  Result Date: 02/10/2019 CLINICAL DATA:  Shortness of breath EXAM: PORTABLE CHEST 1 VIEW COMPARISON:  02/08/2019, 02/03/2019, 02/01/2019 FINDINGS: Bilateral interstitial and patchy alveolar airspace opacities. Trace bilateral pleural effusions. No pneumothorax. Stable cardiomegaly. No acute osseous abnormality. IMPRESSION: Findings most compatible with improving CHF. Electronically Signed   By: Kathreen Devoid   On: 02/10/2019 14:10    Patient Profile     63 y.o. female with past medical history of stress-induced cardiomyopathy, diabetes mellitus, hypertension, chronic renal insufficiency admitted with recurrent congestive heart failure and elevated troponin.  Echocardiogram shows ejection fraction 40 to 45%, moderate left ventricular hypertrophy, apical hypokinesis, mild mitral regurgitation.  Follow-up echocardiogram this  admission shows ejection fraction 30 to 35%, mild aortic stenosis (peak velocity of 2 m/s in setting of reduced LV function, no gradient reported), mild mitral regurgitation and moderate tricuspid regurgitation.  Assessment & Plan   1 acute combined systolic/diastolic congestive heart failure-volume status is improving.  We will continue present dose of diuretics.  Follow renal function closely.  2 cardiomyopathy-etiology unclear.  Previous catheterization in 2018 reportedly showed normal coronary arteries.  Will continue beta-blocker, hydralazine and nitrates.  Discontinue IV nitroglycerin.  Will not add an ARB or Entresto at this point given renal insufficiency.  Given worsening LV function and elevated troponin plan is for right and left cardiac catheterization today.  The risks and benefits including myocardial infarction, CVA and death discussed and she agrees to proceed.  We also discussed the possibility of worsening renal insufficiency/contrast nephropathy.  3 chronic stage III kidney disease-follow renal function closely after procedure.  Limit dye.  No ventriculogram.  4 question aortic stenosis-not well evaluated on recent echocardiogram.  S2 is not diminished on examination.  We will plan follow-up echoes in the future.  5 normocytic anemia-possibly secondary to renal insufficiency.  Per primary care.   For questions or updates, please contact [place] Please consult [email or web address] for contact info under        Signed, Kirk Ruths, MD  02/11/2019, 9:10 AM

## 2019-02-11 NOTE — Progress Notes (Signed)
PROGRESS NOTE  Kara Hanson MVE:[id number] DOB: November 04, 1955 DOA: 02/08/2019 PCP: [name], MD  HPI/Recap of past 24 hours:  Kara Hanson is a 63 y.o. female with history of nonischemic cardiomyopathy last EF measured was 45% last month diabetes mellitus type 2, chronic kidney disease stage III, hypertension just discharged home yesterday after being admitted for acute CHF and patient also was recently admitted in April 2020 for Covid pneumonia subsequent which patient also was admitted for community-acquired pneumonia and August 2020 presents to the ER with complaint of sudden onset of worsening shortness of breath since yesterday afternoon on the day after discharge.  Patient also started developing retrosternal chest pressure with no associated fever chills nausea vomiting productive cough.  EMS was called and patient was brought to the ER.  ED Course: In the ER patient was requiring 100% nonrebreather and eventually was placed on BiPAP.  Chest x-ray is consistent with CHF.  EKG shows sinus rhythm with PVCs with nonspecific T wave changes.  Initial troponin was around 25 and repeat 1 was 660.  BNP was 380.  Patient was given Lasix total of 80 mg IV.  Cardiology on-call was consulted requested starting heparin and nitroglycerin infusion.  Patient admitted for acute CHF and possible non-ST relation MI.  COVID-19 test is pending.  Labs show creatinine 1.4 hemoglobin 11.6.  02/11/19: Patient seen and examined at her bedside this morning.  Reports chills.  Planned right and left heart cath today.   Assessment/Plan: Principal Problem:   Acute respiratory failure with hypoxia (HCC) Active Problems:   Controlled type 2 diabetes mellitus with hyperglycemia (HCC)   Acute on chronic combined systolic and diastolic CHF (congestive heart failure) (HCC)   CKD (chronic kidney disease), stage III   NSTEMI (non-ST elevated myocardial infarction) ([place])   Acute on chronic hypoxic respiratory  failure likely secondary to acute on chronic combined diastolic and systolic CHF.   At baseline she is on 4 L of oxygen by nasal cannula continuously.   Currently on 3 L with O2 saturation 98%. Personally reviewed chest x-ray done on admission which showed cardiomegaly with increase in pulmonary vascularity right greater than left suggestive of pulmonary edema.  Continue to maintain O2 saturation greater than 92%  Elevated troponin Troponin is peaked at greater than 3100 and trending down Repeat troponin trend On heparin drip, continue Seen by cardiology with plan for RHC/LHC 02/11/2019  AKI on CKD 3 Baseline creatinine appears to be 1.3 with GFR 46 Creatinine 1.88 with GFR of 32 on 02/11/2019 On IV Lasix 40 mg twice daily and losartan which could contribute, defer to cardiology 650 cc of urine output recorded in the last 24 hours Continue to closely monitor urine output Continue daily BMPs  Acute on chronic combined systolic and diastolic CHF BNP 283 with 02/10/2019 Last 2D echo with LVEF 40-45% on 01/24/2019. Repeated TTE done on 02/10/2019 showed LVEF 30 to 35% with grade 3 diastolic dysfunction. Continue strict I's and O's and daily weight Ongoing diuresing IV Lasix 40 mg twice daily Management per cardiology  DM 2 with hyperglycemia A1c 7.8 Continue sensitive insulin sliding scale Avoid hypoglycemia  History of gout on allopurinol.  Chronic normocytic anemia likely from CKD: No sign of Overt bleeding.  Hemoglobin stable 8.2 on 02/11/2019 from 8.3 with MCV 89 on 02/10/2019.  Continue to monitor H&H.  History of COVID-19 pneumonia Tested positive on 09/24/2018 Latest COVID-19 test done on 02/09/2019 negative. Afebrile   DVT prophylaxis: Heparin drip Code Status: Full  code. Family Communication: Discussed with patient. Disposition Plan:  Possible discharge in the next 48 to 72 hours when cardiology signs off and patient is hemodynamically stable. Consults called:  Cardiology.     Objective: Vitals:   02/11/19 1141 02/11/19 1146 02/11/19 1151 02/11/19 1217  BP: 123/63 131/66 132/65 (!) 142/65  Pulse: 69 69 72 68  Resp: (!) 23 (!) 21 (!) 25 20  Temp:    97.8 F (36.6 C)  TempSrc:    Oral  SpO2: 99% 99% 99% 98%  Weight:        Intake/Output Summary (Last 24 hours) at 02/11/2019 1258 Last data filed at 02/10/2019 2300 Gross per 24 hour  Intake 120 ml  Output 650 ml  Net -530 ml   Filed Weights   02/10/19 0529 02/11/19 0628  Weight: 73.6 kg 77.1 kg    Exam:  . General: 63 y.o. year-old female pleasant well-developed well-nourished in no acute distress.  Alert oriented x4.  Cardiovascular: Regular rate and rhythm no rubs or gallops no JVD or thyromegaly noted. Marland Kitchen Respiratory: Clear to auscultation no wheezes or rales.  Poor inspiratory effort. . Abdomen: Soft nondistended normal bowel sounds present.. . Musculoskeletal: Trace lower extremity edema.   Marland Kitchen Psychiatry: Mood is appropriate for condition and setting.   Data Reviewed: CBC: Recent Labs  Lab 02/05/19 0203 02/08/19 2330 02/09/19 0615 02/10/19 0226 02/11/19 0359  WBC 4.4 9.9 8.9 5.3 4.9  NEUTROABS 3.0 8.8* 7.4 3.8  --   HGB 9.0* 11.6* 9.5* 8.3* 8.2*  HCT 30.8* 37.6 31.6* 27.0* 26.7*  MCV 92.5 90.8 90.5 89.7 89.0  PLT 259 320 284 234 102   Basic Metabolic Panel: Recent Labs  Lab 02/05/19 0203  02/06/19 0156 02/07/19 0156 02/08/19 2330 02/09/19 0615 02/10/19 0226 02/11/19 0531  NA 137   < > 135  137 137 140 138 139 137  K 3.9   < > 4.0  4.1 4.1 3.9 4.0 3.7 4.5  CL 100   < > 98  99 100 101 99 102 99  CO2 31   < > _0 GLUCOSE 140*   < > 172*  176* 166* 222* 181* 94 86  BUN 41*   < > 54*  48* 46* 39* 39* 35* 38*  CREATININE 1.98*   < > 2.12*  2.03* 1.65* 1.41* 1.39* 1.59* 1.88*  CALCIUM 9.0   < > 8.7*  8.8* 9.0 9.7 9.3 9.1 9.3  MG 1.7  --  1.8 1.7  --  2.1 1.9  --   PHOS 3.5  --  3.1  --   --   --  3.1  --    < > = values in this  interval not displayed.   GFR: Estimated Creatinine Clearance: 31.4 mL/min (A) (by C-G formula based on SCr of 1.88 mg/dL (H)). Liver Function Tests: Recent Labs  Lab 02/05/19 0203 02/06/19 0156 02/09/19 0615 02/10/19 0226  AST  --   --  21 17  ALT  --   --  12 10  ALKPHOS  --   --  64 52  BILITOT  --   --  0.1* 0.2*  PROT  --   --  5.8* 4.9*  ALBUMIN 2.6* 2.7* 2.6* 2.1*   No results for input(s): LIPASE, AMYLASE in the last 168 hours. No results for input(s): AMMONIA in the last 168 hours. Coagulation Profile: Recent Labs  Lab 02/09/19 0258  INR 1.1  Cardiac Enzymes: No results for input(s): CKTOTAL, CKMB, CKMBINDEX, TROPONINI in the last 168 hours. BNP (last 3 results) No results for input(s): PROBNP in the last 8760 hours. HbA1C: No results for input(s): HGBA1C in the last 72 hours. CBG: Recent Labs  Lab 02/11/19 0004 02/11/19 0448 02/11/19 0550 02/11/19 0751 02/11/19 1216  GLUCAP 92 65* 88 65* 74   Lipid Profile: No results for input(s): CHOL, HDL, LDLCALC, TRIG, CHOLHDL, LDLDIRECT in the last 72 hours. Thyroid Function Tests: Recent Labs    02/09/19 0615  TSH 6.624*   Anemia Panel: No results for input(s): VITAMINB12, FOLATE, FERRITIN, TIBC, IRON, RETICCTPCT in the last 72 hours. Urine analysis:    Component Value Date/Time   COLORURINE YELLOW 01/28/2019 0714   APPEARANCEUR TURBID (A) 01/28/2019 0714   LABSPEC 1.015 01/28/2019 0714   PHURINE 5.0 01/28/2019 0714   GLUCOSEU 50 (A) 01/28/2019 0714   HGBUR NEGATIVE 01/28/2019 0714   BILIRUBINUR NEGATIVE 01/28/2019 0714   BILIRUBINUR negative 12/13/2017 [place] 01/28/2019 0714   PROTEINUR >=300 (A) 01/28/2019 0714   UROBILINOGEN 0.2 12/13/2017 1514   NITRITE NEGATIVE 01/28/2019 0714   LEUKOCYTESUR MODERATE (A) 01/28/2019 0714   Sepsis Labs: _0 (procalcitonin:4,lacticidven:4)  ) Recent Results (from the past 240 hour(s))  Culture, respiratory (non-expectorated)      Status: None   Collection Time: 02/01/19  3:43 PM   Specimen: Bronchoalveolar Lavage; Respiratory  Result Value Ref Range Status   Specimen Description   Final    BRONCHIAL ALVEOLAR LAVAGE Performed at [facility], [place] [address], [place], [place] [address]    Special Requests   Final    NONE Performed at [facility], [place] [address] [place], [place] [address]    Gram Stain   Final    ABUNDANT WBC PRESENT,BOTH PMN AND MONONUCLEAR RARE GRAM POSITIVE COCCI    Culture   Final    Consistent with normal respiratory flora. Performed at [facility], [place] [address] [place], [place] [address]    Report Status 02/04/2019 FINAL  Final  MRSA PCR Screening     Status: None   Collection Time: 02/01/19  4:00 PM   Specimen: Nasal Mucosa; Nasopharyngeal  Result Value Ref Range Status   MRSA by PCR NEGATIVE NEGATIVE Final    Comment:        The GeneXpert MRSA Assay (FDA approved for NASAL specimens only), is one component of a comprehensive MRSA colonization surveillance program. It is not intended to diagnose MRSA infection nor to guide or monitor treatment for MRSA infections. Performed at [facility], [place] [address] [place], [place] [address]   SARS CORONAVIRUS 2 (TAT 6-24 HRS) Nasopharyngeal Nasopharyngeal Swab     Status: None   Collection Time: 02/09/19  3:00 AM   Specimen: Nasopharyngeal Swab  Result Value Ref Range Status   SARS Coronavirus 2 NEGATIVE NEGATIVE Final    Comment: (NOTE) SARS-CoV-2 target nucleic acids are NOT DETECTED. The SARS-CoV-2 RNA is generally detectable in upper and lower respiratory specimens during the acute phase of infection. Negative results do not preclude SARS-CoV-2 infection, do not rule out co-infections with other pathogens, and should not be used as the sole basis for treatment or other patient management decisions. Negative results must be combined with clinical  observations, patient history, and epidemiological information. The expected result is Negative. Fact Sheet for Patients: SugarRoll.be Fact Sheet for Healthcare Providers: https://www.woods-mathews.com/ This test is not yet approved or cleared  by the [place] and  has been authorized for detection and/or diagnosis of SARS-CoV-2 by FDA under an Emergency Use Authorization (EUA). This EUA will remain  in effect (meaning this test can be used) for the duration of the COVID-19 declaration under Section 56 4(b)(1) of the Act, 21 U.S.C. section 360bbb-3(b)(1), unless the authorization is terminated or revoked sooner. Performed at [facility], [place] [address] [place], [place] [address]       Studies: No results found.  Scheduled Meds: . allopurinol  150 mg Oral Daily  . aspirin EC  81 mg Oral Daily  . atorvastatin  80 mg Oral q1800  . carvedilol  25 mg Oral BID WC  . DULoxetine  30 mg Oral Daily  . furosemide  40 mg Intravenous Q12H  . gabapentin  100 mg Oral BID  . insulin aspart  0-9 Units Subcutaneous Q4H  . insulin glargine  35 Units Subcutaneous QHS  . isosorbide-hydrALAZINE  1 tablet Oral TID  . pantoprazole  20 mg Oral Daily  . sodium chloride flush  3 mL Intravenous Q12H    Continuous Infusions: . heparin 1,000 Units/hr (02/10/19 2040)     LOS: 2 days     Kayleen Memos, MD [facility] Pager [phone]  If 7PM-7AM, please contact night-coverage [email or web address] Password [facility] 02/11/2019, 12:58 PM

## 2019-02-11 NOTE — TOC Initial Note (Signed)
Transition of Care [facility]) - Initial/Assessment Note    Patient Details  Name: Kara Hanson MRN: [id number] Date of Birth: [birth year]  Transition of Care [facility]) CM/SW Contact:    Bethena Roys, RN Phone Number: 02/11/2019, 4:50 PM  Clinical Narrative: Readmission Risk Assessment completed. Pt presented for acute respiratory failure- sp/ cath 02-11-19. PTA from home with daughter [name]. Daughter lives in an apartment that is two levels (patient's bedroom upstairs). Daughter states patient is having difficulty climbing stairs. Daughter states she is in the process of looking for new housing; however her income is limited. Pt has DME 02 via [employer] at [address]. Pt uses [facility] and her medications are delivered. Pt has PCP at the [facility]. Daughter states that her daughter is in the home with the patient during the day. Pt will benefit from PT/OT for recommendations. CM will continue to monitor for additional transition of care needs.                   Expected Discharge Plan: [place] Barriers to Discharge: Continued Medical Work up   Patient Goals and CMS Choice Patient states their goals for this hospitalization and ongoing recovery are:: "to return home"      Expected Discharge Plan and Services Expected Discharge Plan: [place] Choice: [place] arrangements for the past 2 months: Apartment(2 level apartment- pt's room upstairs.)                   Prior Living Arrangements/Services Living arrangements for the past 2 months: Apartment(2 level apartment- pt's room upstairs.) Lives with:: Adult Children Patient language and need for interpreter reviewed:: Yes Do you feel safe going back to the place where you live?: Yes        Care giver support system in place?: Yes (comment)   Criminal Activity/Legal Involvement Pertinent to Current Situation/Hospitalization: No - Comment as needed  Activities  of Daily Living      Permission Sought/Granted Permission sought to share information with : Family Supports, Customer service manager Permission granted to share information with : Yes, Verbal Permission Granted  Share Information with NAME: [name]- Daughter [phone]           Emotional Assessment Appearance:: Appears stated age Attitude/Demeanor/Rapport: Engaged Affect (typically observed): Accepting Orientation: : Oriented to Self, Oriented to Place, Oriented to  Time, Oriented to Situation Alcohol / Substance Use: Not Applicable Psych Involvement: No (comment)  Admission diagnosis:  SOB (shortness of breath) [R06.02] Hypoxia [R09.02] Elevated troponin [R77.8] Acute on chronic congestive heart failure, unspecified heart failure type ([place]) [I50.9] Acute respiratory failure with hypoxia ([place]) [J96.01] Patient Active Problem List   Diagnosis Date Noted  . Acute respiratory failure with hypoxia ([place]) 02/09/2019  . NSTEMI (non-ST elevated myocardial infarction) ([place]) 02/09/2019  . S/P thoracentesis   . Atelectasis   . Acute on chronic respiratory failure with hypoxia ([place])   . Acute renal failure superimposed on stage 3 chronic kidney disease ([place])   . Epigastric pain   . CHF (congestive heart failure) ([place]) 01/23/2019  . CAP (community acquired pneumonia) 10/31/2018  . Acute CHF (congestive heart failure) ([place]) 09/24/2018  . SIRS (systemic inflammatory response syndrome) ([place]) 07/25/2018  . COVID-19 virus infection 07/20/2018  . Chronic diastolic CHF (congestive heart failure) ([place]) 07/20/2018  . Pneumonia due to COVID-19 virus 07/20/2018  . Vitamin D deficiency 04/02/2018  . Spinal stenosis 03/29/2018  .  Acute and chronic respiratory failure with hypoxia ([place]) 03/20/2018  . Controlled type 2 diabetes with neuropathy ([place]) 03/20/2018  . CKD (chronic kidney disease), stage III 03/20/2018  . Acute on chronic combined systolic and diastolic CHF (congestive heart  failure) ([place]) 11/27/2017  . AKI (acute kidney injury) ([place]) 11/27/2017  . Acute respiratory distress 11/27/2017  . Bulging lumbar disc 11/14/2017  . Gout 11/14/2017  . Hyperlipidemia LDL goal <70 09/30/2016  . Chest pain 06/17/2016  . Stress-induced cardiomyopathy 05/19/2016  . Type 2 diabetes mellitus with diabetic neuropathy, unspecified ([place]) 05/06/2016  . Vertigo 05/06/2016  . Controlled type 2 diabetes mellitus with hyperglycemia ([place]) 04/23/2016  . Hypertension 04/23/2016  . Normochromic normocytic anemia 04/23/2016  . Syncope 04/23/2016   PCP:  [name], MD Pharmacy:   [place], [place] - [address] [place] [address] Phone: [phone] Fax: [phone]     Social Determinants of Health (SDOH) Interventions    Readmission Risk Interventions Readmission Risk Prevention Plan 02/11/2019 02/06/2019 02/06/2019  Transportation Screening Complete Complete -  PCP or Specialist Appt within 3-5 Days - - -  Not Complete comments - - -  HRI or [place] for [place] Planning/Counseling - - -  Palliative Care Screening - - -  Medication Review Press photographer) Complete Complete -  PCP or Specialist appointment within 3-5 days of discharge Complete Complete Complete  HRI or Home Care Consult Complete Patient refused Patient refused  SW Recovery Care/Counseling Consult Complete Complete -  Palliative Care Screening Not Applicable Not Applicable -  [place] Not Applicable Not Applicable -  Some recent data might be hidden

## 2019-02-12 LAB — GLUCOSE, CAPILLARY
Glucose-Capillary: 74 mg/dL (ref 70–99)
Glucose-Capillary: 60 mg/dL — ABNORMAL LOW (ref 70–99)
Glucose-Capillary: 67 mg/dL — ABNORMAL LOW (ref 70–99)
Glucose-Capillary: 102 mg/dL — ABNORMAL HIGH (ref 70–99)
Glucose-Capillary: 150 mg/dL — ABNORMAL HIGH (ref 70–99)
Glucose-Capillary: 106 mg/dL — ABNORMAL HIGH (ref 70–99)

## 2019-02-12 LAB — CBC
HCT: 26.7 % — ABNORMAL LOW (ref 36.0–46.0)
Hemoglobin: 8.2 g/dL — ABNORMAL LOW (ref 12.0–15.0)
MCH: 27.2 pg (ref 26.0–34.0)
MCHC: 30.7 g/dL (ref 30.0–36.0)
MCV: 88.7 fL (ref 80.0–100.0)
Platelets: 223 10*3/uL (ref 150–400)
RBC: 3.01 MIL/uL — ABNORMAL LOW (ref 3.87–5.11)
RDW: 14.7 % (ref 11.5–15.5)
WBC: 4.3 10*3/uL (ref 4.0–10.5)
nRBC: 0 % (ref 0.0–0.2)

## 2019-02-12 LAB — BASIC METABOLIC PANEL
Anion gap: 11 (ref 5–15)
BUN: 38 mg/dL — ABNORMAL HIGH (ref 8–23)
CO2: 26 mmol/L (ref 22–32)
Calcium: 9.2 mg/dL (ref 8.9–10.3)
Chloride: 100 mmol/L (ref 98–111)
Creatinine, Ser: 1.64 mg/dL — ABNORMAL HIGH (ref 0.44–1.00)
GFR calc Af Amer: 38 mL/min — ABNORMAL LOW (ref >60)
GFR calc non Af Amer: 33 mL/min — ABNORMAL LOW (ref >60)
Glucose, Bld: 110 mg/dL — ABNORMAL HIGH (ref 70–99)
Potassium: 4.7 mmol/L (ref 3.5–5.1)
Sodium: 137 mmol/L (ref 135–145)

## 2019-02-12 MED ORDER — FUROSEMIDE 10 MG/ML IJ SOLN
60.0000 mg | Freq: Two times a day (BID) | INTRAMUSCULAR | Status: DC
  Administered 2019-02-12 – 2019-02-13 (×4): 60 mg via INTRAVENOUS
  Filled 2019-02-12 (×4): qty 6

## 2019-02-12 MED ORDER — BENZONATATE 100 MG PO CAPS
200.0000 mg | ORAL_CAPSULE | Freq: Three times a day (TID) | ORAL | Status: AC
  Administered 2019-02-12 – 2019-02-15 (×8): 200 mg via ORAL
  Filled 2019-02-12 (×8): qty 2

## 2019-02-12 NOTE — Care Management Important Message (Signed)
Important Message  Patient Details  Name: Kara Hanson MRN: [id number] Date of Birth: Jul 22, 1955   Medicare Important Message Given:  Yes     [name] 02/12/2019, 1:27 PM

## 2019-02-12 NOTE — Plan of Care (Signed)
  Problem: Clinical Measurements: Goal: Ability to maintain clinical measurements within normal limits will improve Outcome: Progressing 11/8 CBG's running low. NPO for procedure,Poor po intake due to garlic allergy and Pt "doesn't like food". Encouraged glucerna shake if not eating meals.  Goal: Respiratory complications will improve Outcome: Progressing Goal: Cardiovascular complication will be avoided Outcome: Progressing   Problem: Pain Managment: Goal: General experience of comfort will improve Outcome: Progressing   Problem: Elimination: Goal: Will not experience complications related to bowel motility Outcome: Completed/Met

## 2019-02-12 NOTE — Progress Notes (Signed)
Progress Note  Patient Name: Kara Hanson Date of Encounter: 02/12/2019  Primary Cardiologist: Candee Furbish, MD   Subjective   Denies CP or dyspnea  Inpatient Medications    Scheduled Meds: . allopurinol  150 mg Oral Daily  . aspirin EC  81 mg Oral Daily  . atorvastatin  80 mg Oral q1800  . carvedilol  25 mg Oral BID WC  . DULoxetine  30 mg Oral Daily  . enoxaparin (LOVENOX) injection  40 mg Subcutaneous Q24H  . furosemide  40 mg Intravenous Q12H  . gabapentin  100 mg Oral BID  . insulin aspart  0-9 Units Subcutaneous Q4H  . insulin glargine  35 Units Subcutaneous QHS  . isosorbide-hydrALAZINE  1 tablet Oral TID  . pantoprazole  20 mg Oral Daily  . sodium chloride flush  3 mL Intravenous Q12H  . sodium chloride flush  3 mL Intravenous Q12H   Continuous Infusions: . sodium chloride     PRN Meds: sodium chloride, acetaminophen **OR** acetaminophen, benzonatate, bisacodyl, ondansetron **OR** ondansetron (ZOFRAN) IV, polyethylene glycol, sodium chloride flush   Vital Signs    Vitals:   02/11/19 1615 02/11/19 2016 02/11/19 2029 02/12/19 0449  BP: (!) 139/57 (!) 128/54  (!) 130/59  Pulse: 67     Resp:      Temp:   98.5 F (36.9 C) 98.2 F (36.8 C)  TempSrc:   Oral Oral  SpO2: 95% 97%    Weight:    77.4 kg    Intake/Output Summary (Last 24 hours) at 02/12/2019 0840 Last data filed at 02/12/2019 0452 Gross per 24 hour  Intake 452.77 ml  Output 850 ml  Net -397.23 ml   Last 3 Weights 02/12/2019 02/11/2019 02/10/2019  Weight (lbs) 170 lb 10.2 oz 169 lb 15.6 oz 162 lb 3.2 oz  Weight (kg) 77.4 kg 77.1 kg 73.573 kg      Telemetry    Sinus- Personally Reviewed  Physical Exam   GEN: WD, NAD  Neck: No JVD Cardiac: RRR, 2/6 systolic murmur; no DM Respiratory: Diminished BS bases; no wheeze GI: Soft, NT/ND MS: No edema; radial cath site with no hematoma Neuro:  Grossly intact Psych: Normal affect   Labs    High Sensitivity Troponin:   Recent Labs   Lab 01/23/19 2034 02/08/19 2330 02/09/19 0114 02/09/19 0615 02/10/19 0919  TROPONINIHS 13 25* 660* 3,166* 1,015*      Chemistry Recent Labs  Lab 02/06/19 0156  02/09/19 0615 02/10/19 0226 02/11/19 0531  02/11/19 1144 02/11/19 1145 02/12/19 0315  NA 135  137   < > 138 139 137   < > 137 144 137  K 4.0  4.1   < > 4.0 3.7 4.5   < > 4.1 3.4* 4.7  CL 98  99   < > 99 102 99  --   --   --  100  CO2 30  30   < > 29 29 29   --   --   --  26  GLUCOSE 172*  176*   < > 181* 94 86  --   --   --  110*  BUN 54*  48*   < > 39* 35* 38*  --   --   --  38*  CREATININE 2.12*  2.03*   < > 1.39* 1.59* 1.88*  --   --   --  1.64*  CALCIUM 8.7*  8.8*   < > 9.3 9.1 9.3  --   --   --  9.2  PROT  --   --  5.8* 4.9*  --   --   --   --   --   ALBUMIN 2.7*  --  2.6* 2.1*  --   --   --   --   --   AST  --   --  21 17  --   --   --   --   --   ALT  --   --  12 10  --   --   --   --   --   ALKPHOS  --   --  64 52  --   --   --   --   --   BILITOT  --   --  0.1* 0.2*  --   --   --   --   --   GFRNONAA 24*  25*   < > 40* 34* 28*  --   --   --  33*  GFRAA 28*  29*   < > 46* 39* 32*  --   --   --  38*  ANIONGAP 7  8   < > 10 8 9   --   --   --  11   < > = values in this interval not displayed.     Hematology Recent Labs  Lab 02/10/19 0226 02/11/19 0359  02/11/19 1144 02/11/19 1145 02/12/19 0315  WBC 5.3 4.9  --   --   --  4.3  RBC 3.01* 3.00*  --   --   --  3.01*  HGB 8.3* 8.2*   < > 8.5* 7.5* 8.2*  HCT 27.0* 26.7*   < > 25.0* 22.0* 26.7*  MCV 89.7 89.0  --   --   --  88.7  MCH 27.6 27.3  --   --   --  27.2  MCHC 30.7 30.7  --   --   --  30.7  RDW 14.7 14.6  --   --   --  14.7  PLT 234 214  --   --   --  223   < > = values in this interval not displayed.    BNP Recent Labs  Lab 02/08/19 2330 02/10/19 0700  BNP 380.8* 379.3*     Patient Profile     63 y.o. female with past medical history of stress-induced cardiomyopathy, diabetes mellitus, hypertension, chronic renal  insufficiency admitted with recurrent congestive heart failure and elevated troponin.  Echocardiogram shows ejection fraction 40 to 45%, moderate left ventricular hypertrophy, apical hypokinesis, mild mitral regurgitation.  Follow-up echocardiogram this admission shows ejection fraction 30 to 35%, mild aortic stenosis (peak velocity of 2 m/s in setting of reduced LV function, no gradient reported), mild mitral regurgitation and moderate tricuspid regurgitation.  Assessment & Plan   1 acute combined systolic/diastolic congestive heart failure-pulmonary capillary wedge pressure 26 at time of catheterization yesterday.  Volume status is improving.  Increase lasix to 60 BID. Follow renal function closely.  2 cardiomyopathy-etiology unclear.  Cardiac catheterization yesterday showed nonobstructive coronary disease.  Will continue beta-blocker, hydralazine and nitrates. Will not add an ARB or Entresto at this point given renal insufficiency.  Titrate medications and repeat echocardiogram in 3 months.  If LV function remains reduced with ejection fraction less than 35% would need to consider ICD.  3 chronic stage III kidney disease-follow renal function closely with diuresis; only 30 cc dye used with cath yesterday.  4 question aortic  stenosis-not well evaluated on recent echocardiogram.  S2 is not diminished on examination.  We will plan follow-up echoes in the future.  5 normocytic anemia-possibly secondary to renal insufficiency.  Per primary care.  6 CAD-continue ASA and statin.   For questions or updates, please contact [place] Please consult [email or web address] for contact info under        Signed, Kirk Ruths, MD  02/12/2019, 8:40 AM

## 2019-02-12 NOTE — Evaluation (Signed)
Physical Therapy Evaluation Patient Details Name: Kara Hanson MRN: [id number] DOB: 1955-06-07 Today's Date: 02/12/2019   History of Present Illness  Patient is a 63 y/o female who presents with SOB. Admitted with acute combined systolic/diastolic congestive heart failure. PMH includes CHF, COVID positive 4/9-4/22 and COVID with PNA 6/22, CVA, HTN, DM.  Clinical Impression  Patient presents with generalized weakness, dyspnea on exertion, decreased endurance and impaired mobility s/p above. Pt lives with daughter and reports being independent PTA, furniture walker at baseline and does some cooking. Wears 02 at home. Today, pt tolerated bed mobility, transfers and short distance ambulation with min guard assist for balance/safety. Sp02 dropped to mid 80s on 4L/min 02 [place]. Able to recover with rest breaks and cues for pursed lip breathing. Reports wanting to move into a new house without stairs which daughter is working on. Will follow acutely to maximize independence and mobility prior to return home.    Follow Up Recommendations Home health PT;Supervision/Assistance - 24 hour    Equipment Recommendations  None recommended by PT    Recommendations for Other Services       Precautions / Restrictions Precautions Precautions: Fall Precaution Comments: 4L/min 02 Restrictions Weight Bearing Restrictions: No      Mobility  Bed Mobility Overal bed mobility: Needs Assistance Bed Mobility: Supine to Sit     Supine to sit: Min guard;HOB elevated     General bed mobility comments: Use of rail and increased time to get to EOB.  Transfers Overall transfer level: Needs assistance Equipment used: Rolling walker (2 wheeled) Transfers: Sit to/from Stand Sit to Stand: Min guard         General transfer comment: Min guard for safety. Stood from [employer], from [place] x1, transferred to chair post ambulation.  Ambulation/Gait Ambulation/Gait assistance: Min guard Gait Distance (Feet): 50  Feet Assistive device: Rolling walker (2 wheeled) Gait Pattern/deviations: Step-through pattern;Decreased stride length Gait velocity: decr   General Gait Details: Slow, mildly unsteady gait with use of RW; normally furniture walker at ome. Cues for RW proximity. 1 standing rest break. Sp02 dropped to mid 80s on 4L./min 02, Cues for pursed lip breathing.  Stairs            Wheelchair Mobility    Modified Rankin (Stroke Patients Only)       Balance Overall balance assessment: Needs assistance Sitting-balance support: No upper extremity supported;Feet supported Sitting balance-Leahy Scale: Good     Standing balance support: During functional activity Standing balance-Leahy Scale: Fair Standing balance comment: Able to perform pericare without assist.                             Pertinent Vitals/Pain Pain Assessment: No/denies pain    Home Living Family/patient expects to be discharged to:: Private residence Living Arrangements: Children Available Help at Discharge: Family;Available 24 hours/day Type of Home: House Home Access: Stairs to enter Entrance Stairs-Rails: Right Entrance Stairs-Number of Steps: 5 Home Layout: Two level;Bed/bath upstairs Home Equipment: None      Prior Function Level of Independence: Independent         Comments: Reports she does not use any DME- had them at his son's house but not at her daughter's. Furniture walker at baseline. Bedroom upstairs so tries to minimize going up/down steps     Hand Dominance   Dominant Hand: Right    Extremity/Trunk Assessment   Upper Extremity Assessment Upper Extremity Assessment: Defer to OT evaluation  Lower Extremity Assessment Lower Extremity Assessment: Generalized weakness    Cervical / Trunk Assessment Cervical / Trunk Assessment: Normal  Communication   Communication: No difficulties  Cognition Arousal/Alertness: Awake/alert Behavior During Therapy: WFL for tasks  assessed/performed Overall Cognitive Status: Within Functional Limits for tasks assessed                                        General Comments      Exercises     Assessment/Plan    PT Assessment Patient needs continued PT services  PT Problem List Decreased strength;Decreased activity tolerance;Decreased balance;Decreased mobility;Cardiopulmonary status limiting activity;Decreased safety awareness       PT Treatment Interventions DME instruction;Gait training;Stair training;Functional mobility training;Neuromuscular re-education;Balance training;Therapeutic exercise;Therapeutic activities;Patient/family education    PT Goals (Current goals can be found in the Care Plan section)  Acute Rehab PT Goals Patient Stated Goal: to move out of her current living situation PT Goal Formulation: With patient Time For Goal Achievement: 02/26/19 Potential to Achieve Goals: Good    Frequency Min 3X/week   Barriers to discharge Inaccessible home environment flight of stair to get to bedroom and to get in house    Co-evaluation               AM-PAC PT "6 Clicks" Mobility  Outcome Measure Help needed turning from your back to your side while in a flat bed without using bedrails?: None Help needed moving from lying on your back to sitting on the side of a flat bed without using bedrails?: A Little Help needed moving to and from a bed to a chair (including a wheelchair)?: A Little Help needed standing up from a chair using your arms (e.g., wheelchair or bedside chair)?: A Little Help needed to walk in hospital room?: A Little Help needed climbing 3-5 steps with a railing? : A Little 6 Click Score: 19    End of Session Equipment Utilized During Treatment: Oxygen;Gait belt Activity Tolerance: Patient tolerated treatment well Patient left: in chair;with call bell/phone within reach;with chair alarm set Nurse Communication: Mobility status PT Visit Diagnosis: Muscle  weakness (generalized) (M62.81);Difficulty in walking, not elsewhere classified (R26.2)    Time: 6440-3474 PT Time Calculation (min) (ACUTE ONLY): 26 min   Charges:   PT Evaluation $PT Eval Moderate Complexity: 1 Mod PT Treatments $Therapeutic Activity: 8-22 mins        Marisa Severin, PT, DPT Acute Rehabilitation Services Pager [phone] Office [phone]      Marguarite Arbour A Sabra Heck 02/12/2019, 2:21 PM

## 2019-02-12 NOTE — Progress Notes (Signed)
PROGRESS NOTE  [name] OYD:[id number] DOB: December 10, 1955 DOA: 02/08/2019 PCP: [name], MD  HPI/Recap of past 24 hours:  Kara Hanson is a 63 y.o. female with history of nonischemic cardiomyopathy last EF measured was 45% last month diabetes mellitus type 2, chronic kidney disease stage III, hypertension just discharged home yesterday after being admitted for acute CHF and patient also was recently admitted in April 2020 for Covid pneumonia subsequent which patient also was admitted for community-acquired pneumonia and August 2020 presents to the ER with complaint of sudden onset of worsening shortness of breath since yesterday afternoon on the day after discharge.  Patient also started developing retrosternal chest pressure with no associated fever chills nausea vomiting productive cough.  EMS was called and patient was brought to the ER.  ED Course: In the ER patient was requiring 100% nonrebreather and eventually was placed on BiPAP.  Chest x-ray is consistent with CHF.  EKG shows sinus rhythm with PVCs with nonspecific T wave changes.  Initial troponin was around 25 and repeat 1 was 660.  BNP was 380.  Patient was given Lasix total of 80 mg IV.  Cardiology on-call was consulted requested starting heparin and nitroglycerin infusion.  Patient admitted for acute CHF and possible non-ST relation MI.  COVID-19 test is pending.  Labs show creatinine 1.4 hemoglobin 11.6  02/12/19: Patient was seen and examined at bedside this morning.  She reports persistent cough that keeps her awake at night.  Reviewed chest x-ray done on 02/10/2019 which showed cardiomegaly with increase in pulmonary vascularity indicative of pulmonary edema likely from acute on chronic systolic CHF.  Procalcitonin and lactic acid negative.  O2 saturation 96 to 98% on 3 L.  On 4 L at baseline.  Continue symptomatic management.  Assessment/Plan: Principal Problem:   Acute respiratory failure with hypoxia (HCC) Active  Problems:   Controlled type 2 diabetes mellitus with hyperglycemia (HCC)   Acute on chronic combined systolic and diastolic CHF (congestive heart failure) (HCC)   CKD (chronic kidney disease), stage III   NSTEMI (non-ST elevated myocardial infarction) ([place])   Acute on chronic hypoxic respiratory failure likely secondary to acute on chronic combined diastolic and systolic CHF.   At baseline she is on 4 L of oxygen by nasal cannula continuously.   Currently on 3 L with O2 saturation 98%. Personally reviewed chest x-ray done on admission which showed cardiomegaly with increase in pulmonary vascularity right greater than left suggestive of pulmonary edema.  Continue to maintain O2 saturation greater than 92%  Cough likely secondary to pulmonary edema in the setting of acute on chronic systolic CHF Continue Tessalon Perles increase dose to 200 mg 3 times daily.  Elevated troponin Troponin is peaked at greater than 3100 and trending down  RHC/LHC 02/11/2019 showed nonobstructive coronary artery disease.  Improving AKI on CKD 3 Baseline creatinine appears to be 1.3 with GFR 46 Creatinine 1.88 with GFR of 32 on 02/11/2019>> creatinine 1.62 on 02/12/2019. On IV Lasix dose increased to 60 mg twice daily by cardiology Losartan discontinued per cardiology. Net I&O -347cc  Acute on chronic combined systolic and diastolic CHF BNP 672 with 02/10/2019 Last 2D echo with LVEF 40-45% on 01/24/2019. Repeated TTE done on 02/10/2019 showed LVEF 30 to 35% with grade 3 diastolic dysfunction. Continue strict I's and O's and daily weight Ongoing diuresing with IV Lasix 60 mg twice daily.  DM 2 with hyperglycemia A1c 7.8 Continue sensitive insulin sliding scale Avoid hypoglycemia  History of gout on  allopurinol.  Chronic normocytic anemia likely from CKD: No sign of Overt bleeding.  Hemoglobin stable 8.2 on 02/12/2019 with MCV 88.    History of COVID-19 pneumonia Tested positive on 09/24/2018 Latest  COVID-19 test done on 02/09/2019 negative. Afebrile   DVT prophylaxis:  Subcu Lovenox daily Code Status: Full code. Family Communication: Discussed with patient. Disposition Plan:  Possible discharge in the next 48 to 72 hours when cardiology signs off and patient is hemodynamically stable. Consults called: Cardiology.     Objective: Vitals:   02/11/19 2016 02/11/19 2029 02/12/19 0449 02/12/19 0900  BP: (!) 128/54  (!) 130/59 (!) 135/56  Pulse:    76  Resp:    15  Temp:  98.5 F (36.9 C) 98.2 F (36.8 C) 98.5 F (36.9 C)  TempSrc:  Oral Oral Oral  SpO2: 97%     Weight:   77.4 kg     Intake/Output Summary (Last 24 hours) at 02/12/2019 1439 Last data filed at 02/12/2019 0452 Gross per 24 hour  Intake 452.77 ml  Output 450 ml  Net 2.77 ml   Filed Weights   02/10/19 0529 02/11/19 0628 02/12/19 0449  Weight: 73.6 kg 77.1 kg 77.4 kg    Exam:  . General: 63 y.o. year-old female pleasant . Well-nourished no acute distress.  Alert oriented x4.  Cardiovascular: Regular rate and rhythm no rubs or gallops.  Respiratory: Clear to Auscultation No Wheezes or Rales.  Poor inspiratory effort. . Abdomen: Soft nontender nondistended normal bowel sounds.   . Musculoskeletal: Trace lower extremity edema.   Marland Kitchen Psychiatry: Mood is appropriate for condition and setting.   Data Reviewed: CBC: Recent Labs  Lab 02/08/19 2330 02/09/19 0615 02/10/19 0226 02/11/19 0359 02/11/19 1139 02/11/19 1144 02/11/19 1145 02/12/19 0315  WBC 9.9 8.9 5.3 4.9  --   --   --  4.3  NEUTROABS 8.8* 7.4 3.8  --   --   --   --   --   HGB 11.6* 9.5* 8.3* 8.2* 8.8* 8.5* 7.5* 8.2*  HCT 37.6 31.6* 27.0* 26.7* 26.0* 25.0* 22.0* 26.7*  MCV 90.8 90.5 89.7 89.0  --   --   --  88.7  PLT 320 284 234 214  --   --   --  195   Basic Metabolic Panel: Recent Labs  Lab 02/06/19 0156 02/07/19 0156 02/08/19 2330 02/09/19 0615 02/10/19 0226 02/11/19 0531 02/11/19 1139 02/11/19 1144 02/11/19 1145 02/12/19  0315  NA 135  137 137 140 138 139 137 136 137 144 137   K 4.0  4.1 4.1 3.9 4.0 3.7 4.5 4.2 4.1 3.4* 4.7  CL 98  99 100 101 99 102 99  --   --   --  100  CO2 30  30 29 29 29 29 29   --   --   --  26  GLUCOSE 172*  176* 166* 222* 181* 94 86  --   --   --  110*  BUN 54*  48* 46* 39* 39* 35* 38*  --   --   --  38*  CREATININE 2.12*  2.03* 1.65* 1.41* 1.39* 1.59* 1.88*  --   --   --  1.64*  CALCIUM 8.7*  8.8* 9.0 9.7 9.3 9.1 9.3  --   --   --  9.2  MG 1.8 1.7  --  2.1 1.9  --   --   --   --   --   PHOS 3.1  --   --   --  3.1  --   --   --   --   --    GFR: Estimated Creatinine Clearance: 36.1 mL/min (A) (by C-G formula based on SCr of 1.64 mg/dL (H)). Liver Function Tests: Recent Labs  Lab 02/06/19 0156 02/09/19 0615 02/10/19 0226  AST  --  21 17  ALT  --  12 10  ALKPHOS  --  64 52  BILITOT  --  0.1* 0.2*  PROT  --  5.8* 4.9*  ALBUMIN 2.7* 2.6* 2.1*   No results for input(s): LIPASE, AMYLASE in the last 168 hours. No results for input(s): AMMONIA in the last 168 hours. Coagulation Profile: Recent Labs  Lab 02/09/19 0258  INR 1.1   Cardiac Enzymes: No results for input(s): CKTOTAL, CKMB, CKMBINDEX, TROPONINI in the last 168 hours. BNP (last 3 results) No results for input(s): PROBNP in the last 8760 hours. HbA1C: No results for input(s): HGBA1C in the last 72 hours. CBG: Recent Labs  Lab 02/11/19 2347 02/12/19 0448 02/12/19 0804 02/12/19 0930 02/12/19 1153  GLUCAP 116* 74 60* 67* 102*   Lipid Profile: No results for input(s): CHOL, HDL, LDLCALC, TRIG, CHOLHDL, LDLDIRECT in the last 72 hours. Thyroid Function Tests: No results for input(s): TSH, T4TOTAL, FREET4, T3FREE, THYROIDAB in the last 72 hours. Anemia Panel: No results for input(s): VITAMINB12, FOLATE, FERRITIN, TIBC, IRON, RETICCTPCT in the last 72 hours. Urine analysis:    Component Value Date/Time   COLORURINE YELLOW 01/28/2019 0714   APPEARANCEUR TURBID (A) 01/28/2019 0714   LABSPEC 1.015  01/28/2019 0714   PHURINE 5.0 01/28/2019 0714   GLUCOSEU 50 (A) 01/28/2019 0714   HGBUR NEGATIVE 01/28/2019 0714   BILIRUBINUR NEGATIVE 01/28/2019 0714   BILIRUBINUR negative 12/13/2017 [place] 01/28/2019 0714   PROTEINUR >=300 (A) 01/28/2019 0714   UROBILINOGEN 0.2 12/13/2017 1514   NITRITE NEGATIVE 01/28/2019 0714   LEUKOCYTESUR MODERATE (A) 01/28/2019 0714   Sepsis Labs: @LABRCNTIP (procalcitonin:4,lacticidven:4)  ) Recent Results (from the past 240 hour(s))  SARS CORONAVIRUS 2 (TAT 6-24 HRS) Nasopharyngeal Nasopharyngeal Swab     Status: None   Collection Time: 02/09/19  3:00 AM   Specimen: Nasopharyngeal Swab  Result Value Ref Range Status   SARS Coronavirus 2 NEGATIVE NEGATIVE Final    Comment: (NOTE) SARS-CoV-2 target nucleic acids are NOT DETECTED. The SARS-CoV-2 RNA is generally detectable in upper and lower respiratory specimens during the acute phase of infection. Negative results do not preclude SARS-CoV-2 infection, do not rule out co-infections with other pathogens, and should not be used as the sole basis for treatment or other patient management decisions. Negative results must be combined with clinical observations, patient history, and epidemiological information. The expected result is Negative. Fact Sheet for Patients: SugarRoll.be Fact Sheet for Healthcare Providers: https://www.woods-mathews.com/ This test is not yet approved or cleared by the [place] FDA and  has been authorized for detection and/or diagnosis of SARS-CoV-2 by FDA under an Emergency Use Authorization (EUA). This EUA will remain  in effect (meaning this test can be used) for the duration of the COVID-19 declaration under Section 56 4(b)(1) of the Act, 21 U.S.C. section 360bbb-3(b)(1), unless the authorization is terminated or revoked sooner. Performed at [facility], [place] [address] [place], [place] [address]        Studies: No results found.  Scheduled Meds: . allopurinol  150 mg Oral Daily  . aspirin EC  81 mg Oral Daily  . atorvastatin  80 mg Oral q1800  .  carvedilol  25 mg Oral BID WC  . DULoxetine  30 mg Oral Daily  . enoxaparin (LOVENOX) injection  40 mg Subcutaneous Q24H  . furosemide  60 mg Intravenous BID  . gabapentin  100 mg Oral BID  . insulin aspart  0-9 Units Subcutaneous Q4H  . insulin glargine  35 Units Subcutaneous QHS  . isosorbide-hydrALAZINE  1 tablet Oral TID  . pantoprazole  20 mg Oral Daily  . sodium chloride flush  3 mL Intravenous Q12H  . sodium chloride flush  3 mL Intravenous Q12H    Continuous Infusions: . sodium chloride       LOS: 3 days     Kayleen Memos, MD [facility] Pager [phone]  If 7PM-7AM, please contact night-coverage [email or web address] Password [facility] 02/12/2019, 2:39 PM

## 2019-02-13 DIAGNOSIS — Z794 Long term (current) use of insulin: Secondary | ICD-10-CM

## 2019-02-13 LAB — BASIC METABOLIC PANEL
Anion gap: 10 (ref 5–15)
BUN: 39 mg/dL — ABNORMAL HIGH (ref 8–23)
CO2: 29 mmol/L (ref 22–32)
Calcium: 9.2 mg/dL (ref 8.9–10.3)
Chloride: 99 mmol/L (ref 98–111)
Creatinine, Ser: 1.89 mg/dL — ABNORMAL HIGH (ref 0.44–1.00)
GFR calc Af Amer: 32 mL/min — ABNORMAL LOW (ref >60)
GFR calc non Af Amer: 28 mL/min — ABNORMAL LOW (ref >60)
Glucose, Bld: 98 mg/dL (ref 70–99)
Potassium: 4.3 mmol/L (ref 3.5–5.1)
Sodium: 138 mmol/L (ref 135–145)

## 2019-02-13 LAB — GLUCOSE, CAPILLARY
Glucose-Capillary: 110 mg/dL — ABNORMAL HIGH (ref 70–99)
Glucose-Capillary: 110 mg/dL — ABNORMAL HIGH (ref 70–99)
Glucose-Capillary: 141 mg/dL — ABNORMAL HIGH (ref 70–99)
Glucose-Capillary: 154 mg/dL — ABNORMAL HIGH (ref 70–99)
Glucose-Capillary: 54 mg/dL — ABNORMAL LOW (ref 70–99)
Glucose-Capillary: 62 mg/dL — ABNORMAL LOW (ref 70–99)
Glucose-Capillary: 67 mg/dL — ABNORMAL LOW (ref 70–99)
Glucose-Capillary: 96 mg/dL (ref 70–99)

## 2019-02-13 NOTE — Progress Notes (Signed)
Physical Therapy Treatment Patient Details Name: Kara Hanson MRN: [id number] DOB: October 10, 1955 Today's Date: 02/13/2019    History of Present Illness Patient is a 63 y/o female who presents with SOB. Admitted with acute combined systolic/diastolic congestive heart failure. PMH includes CHF, COVID positive 4/9-4/22 and COVID with PNA 6/22, CVA, HTN, DM.    PT Comments    Saw for second session to address chairs which per pt and daughter pt has difficulty with at home. Pt able to manage stairs with assist but it is taxing. This is not unexpected since pt's activity tolerance is low. Instructed pt to go up the stairs leading with her stronger leg and come down leading with her weaker leg.  Home health can continue to work toward increasing functional capacity to make this task easier at home.   Follow Up Recommendations  Home health PT;Supervision for mobility/OOB     Equipment Recommendations  None recommended by PT    Recommendations for Other Services       Precautions / Restrictions Precautions Precautions: Fall Precaution Comments: 4L/min 02 Restrictions Weight Bearing Restrictions: No    Mobility  Bed Mobility     General bed mobility comments: Pt up in chair  Transfers Overall transfer level: Needs assistance Equipment used: Rolling walker (2 wheeled) Transfers: Sit to/from Stand Sit to Stand: Supervision         General transfer comment: supervision for lines/safet from chair.   Ambulation/Gait   Stairs Stairs: Yes Stairs assistance: Min assist Stair Management: One rail Left;Step to pattern;Forwards;Backwards Number of Stairs: 6 General stair comments: Used portable step to simulate stairs. Pt used my forearm as simulated rail. Stepped up forward and down backward on 1 step x 6 times   Wheelchair Mobility    Modified Rankin (Stroke Patients Only)       Balance Overall balance assessment: Needs assistance Sitting-balance support: No upper  extremity supported;Feet supported Sitting balance-Leahy Scale: Good     Standing balance support: During functional activity;No upper extremity supported Standing balance-Leahy Scale: Fair                              Cognition Arousal/Alertness: Awake/alert Behavior During Therapy: WFL for tasks assessed/performed Overall Cognitive Status: Within Functional Limits for tasks assessed                                        Exercises      General Comments General comments (skin integrity, edema, etc.): SpO2 90% on 4L with stairs      Pertinent Vitals/Pain Pain Assessment: No/denies pain    Home Living                      Prior Function            PT Goals (current goals can now be found in the care plan section) Progress towards PT goals: Progressing toward goals    Frequency    Min 3X/week      PT Plan Current plan remains appropriate    Co-evaluation              AM-PAC PT "6 Clicks" Mobility   Outcome Measure  Help needed turning from your back to your side while in a flat bed without using bedrails?: None Help needed moving from lying on your back to  sitting on the side of a flat bed without using bedrails?: A Little Help needed moving to and from a bed to a chair (including a wheelchair)?: A Little Help needed standing up from a chair using your arms (e.g., wheelchair or bedside chair)?: A Little Help needed to walk in hospital room?: A Little Help needed climbing 3-5 steps with a railing? : A Little 6 Click Score: 19    End of Session Equipment Utilized During Treatment: Oxygen;Gait belt Activity Tolerance: Patient tolerated treatment well Patient left: in chair;with call bell/phone within reach;with chair alarm set Nurse Communication: Mobility status PT Visit Diagnosis: Muscle weakness (generalized) (M62.81);Difficulty in walking, not elsewhere classified (R26.2)     Time: 2233-6122 PT Time  Calculation (min) (ACUTE ONLY): 9 min  Charges:  $Gait Training: 8-22 mins                     [place] Pager [phone] Office [place] 02/13/2019, 12:04 PM

## 2019-02-13 NOTE — TOC Progression Note (Signed)
Transition of Care [facility]) - Progression Note    Patient Details  Name: Kara Hanson MRN: [id number] Date of Birth: [birth year]  Transition of Care [facility]) CM/SW Contact  Graves-Bigelow, Ocie Cornfield, RN Phone Number: 02/13/2019, 2:17 PM  Clinical Narrative: CM spoke with patient and daughter regarding [place]- Both agreeable to [facility] PT. Medicare.gov list provided to patient and daughter via phone. Family in agreement  to use [facility]. CM did make referral to [place] with [facility] to begin within 24-48 hours post transition home. No further needs from CM at this time.    Expected Discharge Plan: [place] Barriers to Discharge: Continued Medical Work up  Expected Discharge Plan and Services Expected Discharge Plan: [place] In-house Referral: NA   Post Acute Care Choice: [place] arrangements for the past 2 months: Apartment(2 level apartment- pt's room upstairs.)                 HH Arranged: PT [place]: [facility] (now [facility]) Date [place]: 02/13/19 Time [place]: 1416 Representative spoke with at [place]: [facility]   Social Determinants of Health ([place]) Interventions    Readmission Risk Interventions Readmission Risk Prevention Plan 02/11/2019 02/06/2019 02/06/2019  Transportation Screening Complete Complete -  PCP or Specialist Appt within 3-5 Days - - -  Not Complete comments - - -  HRI or [place] for [place] Planning/Counseling - - -  [place] - - -  Medication Review Press photographer) Complete Complete -  PCP or Specialist appointment within 3-5 days of discharge Complete Complete Complete  HRI or Home Care Consult Complete Patient refused Patient refused  SW Recovery Care/Counseling Consult Complete Complete -  Palliative Care Screening Not Applicable Not Applicable -  [place]  Not Applicable Not Applicable -  Some recent data might be hidden

## 2019-02-13 NOTE — Progress Notes (Signed)
Physical Therapy Treatment Patient Details Name: Kara Hanson MRN: [id number] DOB: 10-14-1955 Today's Date: 02/13/2019    History of Present Illness Patient is a 63 y/o female who presents with SOB. Admitted with acute combined systolic/diastolic congestive heart failure. PMH includes CHF, COVID positive 4/9-4/22 and COVID with PNA 6/22, CVA, HTN, DM.    PT Comments    Pt progressing with mobility. Activity tolerance remains low with SpO2 dropping to 88% on 4L with amb.    Follow Up Recommendations  Home health PT;Supervision for mobility/OOB     Equipment Recommendations  None recommended by PT    Recommendations for Other Services       Precautions / Restrictions Precautions Precautions: Fall Precaution Comments: 4L/min 02 Restrictions Weight Bearing Restrictions: No    Mobility  Bed Mobility Overal bed mobility: Modified Independent Bed Mobility: Supine to Sit     Supine to sit: Modified independent (Device/Increase time);HOB elevated     General bed mobility comments: Incr time  Transfers Overall transfer level: Needs assistance Equipment used: Rolling walker (2 wheeled) Transfers: Sit to/from Stand Sit to Stand: Supervision;Min assist         General transfer comment: supervision for lines/safet from bed and chair. Min assist from low commode  Ambulation/Gait Ambulation/Gait assistance: Supervision Gait Distance (Feet): 180 Feet Assistive device: Rolling walker (2 wheeled) Gait Pattern/deviations: Step-through pattern;Decreased stride length Gait velocity: decr Gait velocity interpretation: 1.31 - 2.62 ft/sec, indicative of limited community ambulator General Gait Details: Assist for lines/safety. Amb on 4L of O2 with SpO2 88% at end of amb   Stairs             Wheelchair Mobility    Modified Rankin (Stroke Patients Only)       Balance Overall balance assessment: Needs assistance Sitting-balance support: No upper extremity  supported;Feet supported Sitting balance-Leahy Scale: Good     Standing balance support: During functional activity;No upper extremity supported Standing balance-Leahy Scale: Fair                              Cognition Arousal/Alertness: Awake/alert Behavior During Therapy: WFL for tasks assessed/performed Overall Cognitive Status: Within Functional Limits for tasks assessed                                        Exercises      General Comments        Pertinent Vitals/Pain Pain Assessment: No/denies pain    Home Living                      Prior Function            PT Goals (current goals can now be found in the care plan section) Progress towards PT goals: Progressing toward goals    Frequency    Min 3X/week      PT Plan Current plan remains appropriate    Co-evaluation              AM-PAC PT "6 Clicks" Mobility   Outcome Measure  Help needed turning from your back to your side while in a flat bed without using bedrails?: None Help needed moving from lying on your back to sitting on the side of a flat bed without using bedrails?: A Little Help needed moving to and from a bed to a chair (  including a wheelchair)?: A Little Help needed standing up from a chair using your arms (e.g., wheelchair or bedside chair)?: A Little Help needed to walk in hospital room?: A Little Help needed climbing 3-5 steps with a railing? : A Little 6 Click Score: 19    End of Session Equipment Utilized During Treatment: Oxygen;Gait belt Activity Tolerance: Patient tolerated treatment well Patient left: in chair;with call bell/phone within reach;with chair alarm set Nurse Communication: Mobility status PT Visit Diagnosis: Muscle weakness (generalized) (M62.81);Difficulty in walking, not elsewhere classified (R26.2)     Time: 6378-5885 PT Time Calculation (min) (ACUTE ONLY): 25 min  Charges:  $Gait Training: 23-37 mins                      [place] Pager [phone] Office [place] 02/13/2019, 11:59 AM

## 2019-02-13 NOTE — Progress Notes (Signed)
Progress Note  Patient Name: Kara Hanson Date of Encounter: 02/13/2019  Primary Cardiologist: Candee Furbish, MD   Subjective   Dyspnea has improved; still with cough; no CP  Inpatient Medications    Scheduled Meds: . allopurinol  150 mg Oral Daily  . aspirin EC  81 mg Oral Daily  . atorvastatin  80 mg Oral q1800  . benzonatate  200 mg Oral TID  . carvedilol  25 mg Oral BID WC  . DULoxetine  30 mg Oral Daily  . enoxaparin (LOVENOX) injection  40 mg Subcutaneous Q24H  . furosemide  60 mg Intravenous BID  . gabapentin  100 mg Oral BID  . insulin aspart  0-9 Units Subcutaneous Q4H  . insulin glargine  35 Units Subcutaneous QHS  . isosorbide-hydrALAZINE  1 tablet Oral TID  . pantoprazole  20 mg Oral Daily  . sodium chloride flush  3 mL Intravenous Q12H  . sodium chloride flush  3 mL Intravenous Q12H   Continuous Infusions: . sodium chloride     PRN Meds: sodium chloride, acetaminophen **OR** acetaminophen, bisacodyl, ondansetron **OR** ondansetron (ZOFRAN) IV, polyethylene glycol, sodium chloride flush   Vital Signs    Vitals:   02/12/19 1927 02/12/19 2013 02/13/19 0215 02/13/19 0522  BP:  117/63  (!) 142/64  Pulse:  71  74  Resp:      Temp:  98 F (36.7 C)  98.4 F (36.9 C)  TempSrc:  Oral  Oral  SpO2: 95% 90%  96%  Weight:    73.2 kg  Height:   5\' 5"  (1.651 m)     Intake/Output Summary (Last 24 hours) at 02/13/2019 0815 Last data filed at 02/12/2019 1500 Gross per 24 hour  Intake -  Output 1000 ml  Net -1000 ml   Last 3 Weights 02/13/2019 02/12/2019 02/11/2019  Weight (lbs) 161 lb 4.8 oz 170 lb 10.2 oz 169 lb 15.6 oz  Weight (kg) 73.165 kg 77.4 kg 77.1 kg      Telemetry    Sinus with occasional PVC- Personally Reviewed  Physical Exam   GEN: WD, WN, NAD  Neck: supple Cardiac: RRR Respiratory: Diminished BS bases; no rhonchi GI: Soft, NT/ND, no masses MS: No edema; radial cath site with no hematoma Neuro:  No focal findings   Labs     High Sensitivity Troponin:   Recent Labs  Lab 01/23/19 2034 02/08/19 2330 02/09/19 0114 02/09/19 0615 02/10/19 0919  TROPONINIHS 13 25* 660* 3,166* 1,015*      Chemistry Recent Labs  Lab 02/09/19 0615 02/10/19 0226 02/11/19 0531  02/11/19 1145 02/12/19 0315 02/13/19 0247  NA 138 139 137   < > 144 137 138  K 4.0 3.7 4.5   < > 3.4* 4.7 4.3  CL 99 102 99  --   --  100 99  CO2 29 29 29   --   --  26 29  GLUCOSE 181* 94 86  --   --  110* 98  BUN 39* 35* 38*  --   --  38* 39*  CREATININE 1.39* 1.59* 1.88*  --   --  1.64* 1.89*  CALCIUM 9.3 9.1 9.3  --   --  9.2 9.2  PROT 5.8* 4.9*  --   --   --   --   --   ALBUMIN 2.6* 2.1*  --   --   --   --   --   AST 21 17  --   --   --   --   --  ALT 12 10  --   --   --   --   --   ALKPHOS 64 52  --   --   --   --   --   BILITOT 0.1* 0.2*  --   --   --   --   --   GFRNONAA 40* 34* 28*  --   --  33* 28*  GFRAA 46* 39* 32*  --   --  38* 32*  ANIONGAP 10 8 9   --   --  11 10   < > = values in this interval not displayed.     Hematology Recent Labs  Lab 02/10/19 0226 02/11/19 0359  02/11/19 1144 02/11/19 1145 02/12/19 0315  WBC 5.3 4.9  --   --   --  4.3  RBC 3.01* 3.00*  --   --   --  3.01*  HGB 8.3* 8.2*   < > 8.5* 7.5* 8.2*  HCT 27.0* 26.7*   < > 25.0* 22.0* 26.7*  MCV 89.7 89.0  --   --   --  88.7  MCH 27.6 27.3  --   --   --  27.2  MCHC 30.7 30.7  --   --   --  30.7  RDW 14.7 14.6  --   --   --  14.7  PLT 234 214  --   --   --  223   < > = values in this interval not displayed.    BNP Recent Labs  Lab 02/08/19 2330 02/10/19 0700  BNP 380.8* 379.3*     Patient Profile     63 y.o. female with past medical history of stress-induced cardiomyopathy, diabetes mellitus, hypertension, chronic renal insufficiency admitted with recurrent congestive heart failure and elevated troponin.  Echocardiogram shows ejection fraction 40 to 45%, moderate left ventricular hypertrophy, apical hypokinesis, mild mitral regurgitation.   Follow-up echocardiogram this admission shows ejection fraction 30 to 35%, mild aortic stenosis (peak velocity of 2 m/s in setting of reduced LV function, no gradient reported), mild mitral regurgitation and moderate tricuspid regurgitation.  Assessment & Plan   1 acute combined systolic/diastolic congestive heart failure-pulmonary capillary wedge pressure 26 at time of catheterization yesterday.  I/O-1000.  Symptomatically improving but still short of breath with exertion and persistent cough.  Continue Lasix at present dose and follow renal function.  2 cardiomyopathy-etiology unclear.  Cardiac catheterization showed nonobstructive coronary disease.  Will continue beta-blocker, hydralazine and nitrates. Will not add an ARB or Entresto at this point given renal insufficiency.  Titrate medications and repeat echocardiogram in 3 months.  If LV function remains reduced with ejection fraction less than 35% would need to consider ICD.  3 chronic stage III kidney disease-follow renal function closely with diuresis; only 30 cc dye used with cath.  4 question aortic stenosis-not well evaluated on recent echocardiogram.  S2 is not diminished on examination.  We will plan follow-up echoes in the future.  5 normocytic anemia-possibly secondary to renal insufficiency.  Per primary care.  6 CAD-continue ASA and statin.   For questions or updates, please contact [place] Please consult [email or web address] for contact info under        Signed, Kirk Ruths, MD  02/13/2019, 8:15 AM

## 2019-02-13 NOTE — Progress Notes (Signed)
PROGRESS NOTE  Kara Hanson DTO:[id number] DOB: 02-25-1956 DOA: 02/08/2019 PCP: [name], MD  HPI/Recap of past 24 hours: Kara Hanson is a 63 y.o. female with history of nonischemic cardiomyopathy last EF measured was 45% last month diabetes mellitus type 2, chronic kidney disease stage III, hypertension just discharged home yesterday after being admitted for acute CHF and patient also was recently admitted in April 2020 for Covid pneumonia subsequent which patient also was admitted for community-acquired pneumonia and August 2020 presents to the ER with complaint of sudden onset of worsening shortness of breath since yesterday afternoon on the day after discharge.  Patient also started developing retrosternal chest pressure with no associated fever chills nausea vomiting productive cough.  EMS was called and patient was brought to the ER. In the ER patient was requiring 100% nonrebreather and eventually was placed on BiPAP. Chest x-ray is consistent with CHF.  EKG shows sinus rhythm with PVCs with nonspecific T wave changes.  Initial troponin was around 25 and repeat was 660.  BNP was 380.  Patient was given Lasix total of 80 mg IV.  Cardiology on-call was consulted requested starting heparin and nitroglycerin infusion.  Patient admitted for acute CHF and possible non-STEMI.  COVID-19 test negative.     Today, patient still reports being short of breath, denies any chest pain, still with cough, denies any fever/chills, nausea/vomiting.  Assessment/Plan: Principal Problem:   Acute respiratory failure with hypoxia (HCC) Active Problems:   Controlled type 2 diabetes mellitus with hyperglycemia (HCC)   Acute on chronic combined systolic and diastolic CHF (congestive heart failure) (HCC)   CKD (chronic kidney disease), stage III   NSTEMI (non-ST elevated myocardial infarction) ([facility])  Acute on chronic hypoxic respiratory failure likely secondary to acute on chronic combined diastolic  and systolic CHF.   At baseline she is on 4 L of oxygen by nasal cannula continuously Currently on 3 L with O2 saturation well above 95% Chest x-ray done on admission which showed cardiomegaly with increase in pulmonary vascularity right greater than left suggestive of pulmonary edema.  Continue to maintain O2 saturation greater than 92%  Acute on chronic combined systolic and diastolic CHF BNP 983 with 02/10/2019 Last 2D echo with LVEF 40-45% on 01/24/2019. Repeated TTE done on 02/10/2019 showed LVEF 30 to 35% with grade 3 diastolic dysfunction. Continue strict I's and O's and daily weight Net INO -1347 Ongoing diuresing with IV Lasix 60 mg twice daily, continue Coreg, bidil Cardiology on board  Cough likely secondary to pulmonary edema in the setting of acute on chronic systolic CHF Continue Tessalon Perles increase dose to 200 mg 3 times daily Continue diuresis  Elevated troponin Troponin is peaked at greater than 3100 and trending down  RHC/LHC 02/11/2019 showed nonobstructive coronary artery disease Cardiology on board  AKI on CKD 3 Baseline creatinine appears to be 1.3 with GFR 46 Creatinine 1.88 with GFR of 32 on 02/11/2019, now 1.89 on 11/11  IV Lasix dose increased to 60 mg twice daily by cardiology Losartan discontinued per cardiology. Daily BMP  DM 2 A1c 7.8 Continue sensitive insulin sliding scale, Lantus, hypoglycemic protocol, Accu-Cheks  History of gout Continue allopurinol  Hyperlipidemia Continue Lipitor  Chronic normocytic anemia likely from CKD Hemoglobin stable No signs of bleeding  History of COVID-19 pneumonia Currently afebrile Repeat Covid test on this admission negative Tested positive on 09/24/2018    DVT prophylaxis:  Subcu Lovenox daily Code Status: Full code. Family Communication:  None at bedside Disposition Plan:  Possible discharge in the next 48 to 72 hours when cardiology signs off and patient is hemodynamically stable. Consults  called: Cardiology.     Objective: Vitals:   02/12/19 2013 02/13/19 0215 02/13/19 0522 02/13/19 1444  BP: 117/63  (!) 142/64 138/62  Pulse: 71  74 74  Resp:      Temp: 98 F (36.7 C)  98.4 F (36.9 C) 98.7 F (37.1 C)  TempSrc: Oral  Oral Oral  SpO2: 90%  96% 95%  Weight:   73.2 kg   Height:  5\' 5"  (1.651 m)     No intake or output data in the 24 hours ending 02/13/19 1906 Filed Weights   02/11/19 0628 02/12/19 0449 02/13/19 0522  Weight: 77.1 kg 77.4 kg 73.2 kg    Exam:  General: NAD   Cardiovascular: S1, S2 present  Respiratory:  Bibasilar crackles noted  Abdomen: Soft, nontender, nondistended, bowel sounds present  Musculoskeletal: Trace bilateral pedal edema noted  Skin: Normal  Psychiatry: Normal mood    Data Reviewed: CBC: Recent Labs  Lab 02/08/19 2330 02/09/19 0615 02/10/19 0226 02/11/19 0359 02/11/19 1139 02/11/19 1144 02/11/19 1145 02/12/19 0315  WBC 9.9 8.9 5.3 4.9  --   --   --  4.3  NEUTROABS 8.8* 7.4 3.8  --   --   --   --   --   HGB 11.6* 9.5* 8.3* 8.2* 8.8* 8.5* 7.5* 8.2*  HCT 37.6 31.6* 27.0* 26.7* 26.0* 25.0* 22.0* 26.7*  MCV 90.8 90.5 89.7 89.0  --   --   --  88.7  PLT 320 284 234 214  --   --   --  161   Basic Metabolic Panel: Recent Labs  Lab 02/07/19 0156  02/09/19 0615 02/10/19 0226 02/11/19 0531 02/11/19 1139 02/11/19 1144 02/11/19 1145 02/12/19 0315 02/13/19 0247  NA 137   < > 138 139 137 136 137 144 137 138  K 4.1   < > 4.0 3.7 4.5 4.2 4.1 3.4* 4.7 4.3  CL 100   < > 99 102 99  --   --   --  100 99  CO2 29   < > 29 29 29   --   --   --  26 29  GLUCOSE 166*   < > 181* 94 86  --   --   --  110* 98  BUN 46*   < > 39* 35* 38*  --   --   --  38* 39*  CREATININE 1.65*   < > 1.39* 1.59* 1.88*  --   --   --  1.64* 1.89*  CALCIUM 9.0   < > 9.3 9.1 9.3  --   --   --  9.2 9.2  MG 1.7  --  2.1 1.9  --   --   --   --   --   --   PHOS  --   --   --  3.1  --   --   --   --   --   --    < > = values in this interval not  displayed.   GFR: Estimated Creatinine Clearance: 30.1 mL/min (A) (by C-G formula based on SCr of 1.89 mg/dL (H)). Liver Function Tests: Recent Labs  Lab 02/09/19 0615 02/10/19 0226  AST 21 17  ALT 12 10  ALKPHOS 64 52  BILITOT 0.1* 0.2*  PROT 5.8* 4.9*  ALBUMIN 2.6* 2.1*   No results for input(s): LIPASE,  AMYLASE in the last 168 hours. No results for input(s): AMMONIA in the last 168 hours. Coagulation Profile: Recent Labs  Lab 02/09/19 0258  INR 1.1   Cardiac Enzymes: No results for input(s): CKTOTAL, CKMB, CKMBINDEX, TROPONINI in the last 168 hours. BNP (last 3 results) No results for input(s): PROBNP in the last 8760 hours. HbA1C: No results for input(s): HGBA1C in the last 72 hours. CBG: Recent Labs  Lab 02/13/19 0524 02/13/19 0623 02/13/19 0752 02/13/19 1132 02/13/19 1605  GLUCAP 54* 62* 110* 110* 141*   Lipid Profile: No results for input(s): CHOL, HDL, LDLCALC, TRIG, CHOLHDL, LDLDIRECT in the last 72 hours. Thyroid Function Tests: No results for input(s): TSH, T4TOTAL, FREET4, T3FREE, THYROIDAB in the last 72 hours. Anemia Panel: No results for input(s): VITAMINB12, FOLATE, FERRITIN, TIBC, IRON, RETICCTPCT in the last 72 hours. Urine analysis:    Component Value Date/Time   COLORURINE YELLOW 01/28/2019 0714   APPEARANCEUR TURBID (A) 01/28/2019 0714   LABSPEC 1.015 01/28/2019 0714   PHURINE 5.0 01/28/2019 0714   GLUCOSEU 50 (A) 01/28/2019 0714   HGBUR NEGATIVE 01/28/2019 0714   BILIRUBINUR NEGATIVE 01/28/2019 0714   BILIRUBINUR negative 12/13/2017 [place] 01/28/2019 0714   PROTEINUR >=300 (A) 01/28/2019 0714   UROBILINOGEN 0.2 12/13/2017 1514   NITRITE NEGATIVE 01/28/2019 0714   LEUKOCYTESUR MODERATE (A) 01/28/2019 0714   Sepsis Labs: @LABRCNTIP (procalcitonin:4,lacticidven:4)  ) Recent Results (from the past 240 hour(s))  SARS CORONAVIRUS 2 (TAT 6-24 HRS) Nasopharyngeal Nasopharyngeal Swab     Status: None   Collection  Time: 02/09/19  3:00 AM   Specimen: Nasopharyngeal Swab  Result Value Ref Range Status   SARS Coronavirus 2 NEGATIVE NEGATIVE Final    Comment: (NOTE) SARS-CoV-2 target nucleic acids are NOT DETECTED. The SARS-CoV-2 RNA is generally detectable in upper and lower respiratory specimens during the acute phase of infection. Negative results do not preclude SARS-CoV-2 infection, do not rule out co-infections with other pathogens, and should not be used as the sole basis for treatment or other patient management decisions. Negative results must be combined with clinical observations, patient history, and epidemiological information. The expected result is Negative. Fact Sheet for Patients: SugarRoll.be Fact Sheet for Healthcare Providers: https://www.woods-mathews.com/ This test is not yet approved or cleared by the [place] FDA and  has been authorized for detection and/or diagnosis of SARS-CoV-2 by FDA under an Emergency Use Authorization (EUA). This EUA will remain  in effect (meaning this test can be used) for the duration of the COVID-19 declaration under Section 56 4(b)(1) of the Act, 21 U.S.C. section 360bbb-3(b)(1), unless the authorization is terminated or revoked sooner. Performed at [facility], [place] [address], [place], [place] [address]       Studies: No results found.  Scheduled Meds: . allopurinol  150 mg Oral Daily  . aspirin EC  81 mg Oral Daily  . atorvastatin  80 mg Oral q1800  . benzonatate  200 mg Oral TID  . carvedilol  25 mg Oral BID WC  . DULoxetine  30 mg Oral Daily  . enoxaparin (LOVENOX) injection  40 mg Subcutaneous Q24H  . furosemide  60 mg Intravenous BID  . gabapentin  100 mg Oral BID  . insulin aspart  0-9 Units Subcutaneous Q4H  . insulin glargine  35 Units Subcutaneous QHS  . isosorbide-hydrALAZINE  1 tablet Oral TID  . pantoprazole  20 mg Oral Daily  . sodium chloride flush  3 mL  Intravenous Q12H  .  sodium chloride flush  3 mL Intravenous Q12H    Continuous Infusions: . sodium chloride       LOS: 4 days     Alma Friendly, MD [facility]  If 7PM-7AM, please contact night-coverage [email or web address] 02/13/2019, 7:06 PM

## 2019-02-14 ENCOUNTER — Inpatient Hospital Stay (HOSPITAL_COMMUNITY): Payer: Medicare Other

## 2019-02-14 LAB — CBC WITH DIFFERENTIAL/PLATELET
Abs Immature Granulocytes: 0.01 10*3/uL (ref 0.00–0.07)
Basophils Absolute: 0 10*3/uL (ref 0.0–0.1)
Basophils Relative: 1 %
Eosinophils Absolute: 0.1 10*3/uL (ref 0.0–0.5)
Eosinophils Relative: 2 %
HCT: 25.3 % — ABNORMAL LOW (ref 36.0–46.0)
Hemoglobin: 7.6 g/dL — ABNORMAL LOW (ref 12.0–15.0)
Immature Granulocytes: 0 %
Lymphocytes Relative: 14 %
Lymphs Abs: 0.5 10*3/uL — ABNORMAL LOW (ref 0.7–4.0)
MCH: 27.2 pg (ref 26.0–34.0)
MCHC: 30 g/dL (ref 30.0–36.0)
MCV: 90.7 fL (ref 80.0–100.0)
Monocytes Absolute: 0.4 10*3/uL (ref 0.1–1.0)
Monocytes Relative: 12 %
Neutro Abs: 2.6 10*3/uL (ref 1.7–7.7)
Neutrophils Relative %: 71 %
Platelets: 215 10*3/uL (ref 150–400)
RBC: 2.79 MIL/uL — ABNORMAL LOW (ref 3.87–5.11)
RDW: 14.9 % (ref 11.5–15.5)
WBC: 3.7 10*3/uL — ABNORMAL LOW (ref 4.0–10.5)
nRBC: 0 % (ref 0.0–0.2)

## 2019-02-14 LAB — GLUCOSE, CAPILLARY
Glucose-Capillary: 107 mg/dL — ABNORMAL HIGH (ref 70–99)
Glucose-Capillary: 138 mg/dL — ABNORMAL HIGH (ref 70–99)
Glucose-Capillary: 38 mg/dL — CL (ref 70–99)
Glucose-Capillary: 91 mg/dL (ref 70–99)
Glucose-Capillary: 96 mg/dL (ref 70–99)
Glucose-Capillary: 98 mg/dL (ref 70–99)

## 2019-02-14 LAB — BASIC METABOLIC PANEL
Anion gap: 8 (ref 5–15)
BUN: 42 mg/dL — ABNORMAL HIGH (ref 8–23)
CO2: 32 mmol/L (ref 22–32)
Calcium: 9.2 mg/dL (ref 8.9–10.3)
Chloride: 99 mmol/L (ref 98–111)
Creatinine, Ser: 2.28 mg/dL — ABNORMAL HIGH (ref 0.44–1.00)
GFR calc Af Amer: 25 mL/min — ABNORMAL LOW (ref >60)
GFR calc non Af Amer: 22 mL/min — ABNORMAL LOW (ref >60)
Glucose, Bld: 50 mg/dL — ABNORMAL LOW (ref 70–99)
Potassium: 4.7 mmol/L (ref 3.5–5.1)
Sodium: 139 mmol/L (ref 135–145)

## 2019-02-14 MED ORDER — ENOXAPARIN SODIUM 30 MG/0.3ML ~~LOC~~ SOLN
30.0000 mg | SUBCUTANEOUS | Status: DC
  Administered 2019-02-14 – 2019-02-19 (×6): 30 mg via SUBCUTANEOUS
  Filled 2019-02-14 (×6): qty 0.3

## 2019-02-14 MED ORDER — DEXTROSE 50 % IV SOLN: 1.0000 | INTRAVENOUS | Status: DC | PRN

## 2019-02-14 MED ORDER — DEXTROSE 50 % IV SOLN
INTRAVENOUS | Status: AC
  Administered 2019-02-14: 50 mL
  Filled 2019-02-14: qty 50

## 2019-02-14 NOTE — Progress Notes (Signed)
Inpatient Diabetes Program Recommendations  AACE/ADA: New Consensus Statement on Inpatient Glycemic Control (2015)  Target Ranges:  Prepandial:   less than 140 mg/dL      Peak postprandial:   less than 180 mg/dL (1-2 hours)      Critically ill patients:  140 - 180 mg/dL   Lab Results  Component Value Date   GLUCAP 98 02/14/2019   HGBA1C 7.8 (H) 01/09/2019    Review of Glycemic Control Results for Kara Hanson, Kara Hanson (MRN [id number]) as of 02/14/2019 09:02  Ref. Range 02/13/2019 20:28 02/14/2019 00:28 02/14/2019 06:08 02/14/2019 07:11  Glucose-Capillary Latest Ref Range: 70 - 99 mg/dL 154 (H) 107 (H) 38 (LL) 98   Diabetes history: Type 2 Dm Outpatient Diabetes medications: Novolog 0-12 units TID, Basaglar 5 units QHS Current orders for Inpatient glycemic control: Novolog 0-9 units TID  Inpatient Diabetes Program Recommendations:    Noted multiple episodes of hypoglycemia this admission and this AM of 38 mg/dL, following Lantus 35 units QHS. Since episode, Lantus has been discontinued. In agreement with current orders.   Thanks, Bronson Curb, MSN, RNC-OB Diabetes Coordinator [phone] (8a-5p)

## 2019-02-14 NOTE — Progress Notes (Signed)
Progress Note  Patient Name: Kara Hanson Date of Encounter: 02/14/2019  Primary Cardiologist: Candee Furbish, MD   Subjective   Denies dyspnea or chest pain  Inpatient Medications    Scheduled Meds:  allopurinol  150 mg Oral Daily   aspirin EC  81 mg Oral Daily   atorvastatin  80 mg Oral q1800   benzonatate  200 mg Oral TID   carvedilol  25 mg Oral BID WC   DULoxetine  30 mg Oral Daily   enoxaparin (LOVENOX) injection  40 mg Subcutaneous Q24H   furosemide  60 mg Intravenous BID   gabapentin  100 mg Oral BID   insulin aspart  0-9 Units Subcutaneous Q4H   insulin glargine  35 Units Subcutaneous QHS   isosorbide-hydrALAZINE  1 tablet Oral TID   pantoprazole  20 mg Oral Daily   sodium chloride flush  3 mL Intravenous Q12H   sodium chloride flush  3 mL Intravenous Q12H   Continuous Infusions:  sodium chloride     PRN Meds: sodium chloride, acetaminophen **OR** acetaminophen, bisacodyl, dextrose, ondansetron **OR** ondansetron (ZOFRAN) IV, polyethylene glycol, sodium chloride flush   Vital Signs    Vitals:   02/13/19 1444 02/13/19 1945 02/13/19 2030 02/14/19 0629  BP: 138/62  140/60 115/66  Pulse: 74  72 70  Resp:      Temp: 98.7 F (37.1 C)  98.3 F (36.8 C) 98.4 F (36.9 C)  TempSrc: Oral  Oral Oral  SpO2: 95% 99% 98% 97%  Weight:    72.7 kg  Height:        Intake/Output Summary (Last 24 hours) at 02/14/2019 0741 Last data filed at 02/14/2019 0500 Gross per 24 hour  Intake --  Output 300 ml  Net -300 ml   Last 3 Weights 02/14/2019 02/13/2019 02/12/2019  Weight (lbs) 160 lb 4.8 oz 161 lb 4.8 oz 170 lb 10.2 oz  Weight (kg) 72.712 kg 73.165 kg 77.4 kg      Telemetry    Sinus with occasional PVC- Personally Reviewed  Physical Exam   GEN: WN NAD Neck: supple, no JVD Cardiac: RRR, no gallop Respiratory: Diminished BS bases; no wheeze GI: Soft, NT or distended MS: No edema Neuro:  Grossly intact   Labs    High Sensitivity  Troponin:   Recent Labs  Lab 01/23/19 2034 02/08/19 2330 02/09/19 0114 02/09/19 0615 02/10/19 0919  TROPONINIHS 13 25* 660* 3,166* 1,015*      Chemistry Recent Labs  Lab 02/09/19 0615 02/10/19 0226  02/12/19 0315 02/13/19 0247 02/14/19 0333  NA 138 139   < > 137 138 139  K 4.0 3.7   < > 4.7 4.3 4.7  CL 99 102   < > 100 99 99  CO2 29 29   < > 26 29 32  GLUCOSE 181* 94   < > 110* 98 50*  BUN 39* 35*   < > 38* 39* 42*  CREATININE 1.39* 1.59*   < > 1.64* 1.89* 2.28*  CALCIUM 9.3 9.1   < > 9.2 9.2 9.2  PROT 5.8* 4.9*  --   --   --   --   ALBUMIN 2.6* 2.1*  --   --   --   --   AST 21 17  --   --   --   --   ALT 12 10  --   --   --   --   ALKPHOS 64 52  --   --   --   --  BILITOT 0.1* 0.2*  --   --   --   --   GFRNONAA 40* 34*   < > 33* 28* 22*  GFRAA 46* 39*   < > 38* 32* 25*  ANIONGAP 10 8   < > 11 10 8    < > = values in this interval not displayed.     Hematology Recent Labs  Lab 02/11/19 0359  02/11/19 1145 02/12/19 0315 02/14/19 0333  WBC 4.9  --   --  4.3 3.7*  RBC 3.00*  --   --  3.01* 2.79*  HGB 8.2*   < > 7.5* 8.2* 7.6*  HCT 26.7*   < > 22.0* 26.7* 25.3*  MCV 89.0  --   --  88.7 90.7  MCH 27.3  --   --  27.2 27.2  MCHC 30.7  --   --  30.7 30.0  RDW 14.6  --   --  14.7 14.9  PLT 214  --   --  223 215   < > = values in this interval not displayed.    BNP Recent Labs  Lab 02/08/19 2330 02/10/19 0700  BNP 380.8* 379.3*     Patient Profile     63 y.o. female with past medical history of stress-induced cardiomyopathy, diabetes mellitus, hypertension, chronic renal insufficiency admitted with recurrent congestive heart failure and elevated troponin.  Echocardiogram shows ejection fraction 40 to 45%, moderate left ventricular hypertrophy, apical hypokinesis, mild mitral regurgitation.  Follow-up echocardiogram this admission shows ejection fraction 30 to 35%, mild aortic stenosis (peak velocity of 2 m/s in setting of reduced LV function, no gradient  reported), mild mitral regurgitation and moderate tricuspid regurgitation. Cardiac catheterization on November 9 showed nonobstructive coronary disease and pulmonary capillary wedge pressure 26.  Assessment & Plan   1 acute combined systolic/diastolic congestive heart failure-pulmonary capillary wedge pressure 26 at time of catheterization.  I/O-300.  Symptomatically improving with diuresis but renal function worsening.  We will hold diuretics this morning.  Patient still with decreased breath sounds at bases.  Repeat chest x-ray.  May need repeat thoracentesis.  2 cardiomyopathy-etiology unclear.  Cardiac catheterization showed nonobstructive coronary disease.  Will continue beta-blocker, hydralazine and nitrates. Will not add an ARB or Entresto at this point given renal insufficiency.  Titrate medications and repeat echocardiogram in 3 months.  If LV function remains reduced with ejection fraction less than 35% would need to consider ICD.  3 chronic stage III kidney disease-renal function worse today.  Hold diuretics for now.  Had similar worsening renal function with diuresis previously.  May need nephrology input.  4 question aortic stenosis-not well evaluated on recent echocardiogram.  S2 is not diminished on examination.  We will plan follow-up echoes in the future.  5 normocytic anemia-possibly secondary to renal insufficiency.  Per primary care.  6 CAD-continue ASA and statin.   For questions or updates, please contact [place] Please consult [email or web address] for contact info under        Signed, Kirk Ruths, MD  02/14/2019, 7:41 AM

## 2019-02-14 NOTE — Progress Notes (Signed)
Hypoglycemic Event  CBG: 54 Treatment: 4 oz juice/soda  Symptoms: Shaky  Follow-up CBG: [id number] CBG Result:62  Possible Reasons for Event: Inadequate meal intake  Comments/MD notified: mdon call    Scot Shiraishi, [employer]

## 2019-02-14 NOTE — Progress Notes (Signed)
PROGRESS NOTE  Kara Hanson ZOX:[id number] DOB: 02-05-1956 DOA: 02/08/2019 PCP: [name], MD  HPI/Recap of past 24 hours: Kara Hanson is a 63 y.o. female with history of nonischemic cardiomyopathy last EF measured was 45% last month diabetes mellitus type 2, chronic kidney disease stage III, hypertension just discharged home yesterday after being admitted for acute CHF and patient also was recently admitted in April 2020 for Covid pneumonia subsequent which patient also was admitted for community-acquired pneumonia and August 2020 presents to the ER with complaint of sudden onset of worsening shortness of breath since yesterday afternoon on the day after discharge.  Patient also started developing retrosternal chest pressure with no associated fever chills nausea vomiting productive cough.  EMS was called and patient was brought to the ER. In the ER patient was requiring 100% nonrebreather and eventually was placed on BiPAP. Chest x-ray is consistent with CHF.  EKG shows sinus rhythm with PVCs with nonspecific T wave changes.  Initial troponin was around 25 and repeat was 660.  BNP was 380.  Patient was given Lasix total of 80 mg IV.  Cardiology on-call was consulted requested starting heparin and nitroglycerin infusion.  Patient admitted for acute CHF and possible non-STEMI.  COVID-19 test negative.     Today, patient reported still short of breath, with cough, having some chills, but denies any fever.  Denies any chest pain, abdominal pain, nausea/vomiting/diarrhea.   Assessment/Plan: Principal Problem:   Acute respiratory failure with hypoxia (HCC) Active Problems:   Controlled type 2 diabetes mellitus with hyperglycemia (HCC)   Acute on chronic combined systolic and diastolic CHF (congestive heart failure) (HCC)   CKD (chronic kidney disease), stage III   NSTEMI (non-ST elevated myocardial infarction) ([facility])  Acute on chronic hypoxic respiratory failure likely secondary to  acute on chronic combined diastolic and systolic CHF.   At baseline she is on 4 L of oxygen by nasal cannula continuously Currently on 4 L with O2 saturation well above 95% Chest x-ray done on admission which showed cardiomegaly with increase in pulmonary vascularity right greater than left suggestive of pulmonary edema, repeat chest x-ray showed improving congestion, no new abnormalities Continue to maintain O2 saturation greater than 92%  Acute on chronic combined systolic and diastolic CHF BNP 811 with 02/10/2019 Last 2D echo with LVEF 40-45% on 01/24/2019. Repeated TTE done on 02/10/2019 showed LVEF 30 to 35% with grade 3 diastolic dysfunction. Continue strict I's and O's and daily weight Net INO -1647 Held IV Lasix due to worsening creatinine, continue Coreg, bidil Cardiology on board  Cough likely secondary to pulmonary edema in the setting of acute on chronic systolic CHF Continue Tessalon Perles increase dose to 200 mg 3 times daily  Elevated troponin Troponin is peaked at greater than 3100 and trending down  RHC/LHC 02/11/2019 showed nonobstructive coronary artery disease Cardiology on board  AKI on CKD 3 Worsening creatinine, 2.28 Baseline creatinine appears to be 1.3 with GFR 46 IV Lasix held by cardiology Losartan discontinued per cardiology Daily BMP  DM 2 with hypoglycemia A1c 7.8 Held Lantus Continue sensitive insulin sliding scale, hypoglycemic protocol, Accu-Cheks  History of gout Continue allopurinol  Hyperlipidemia Continue Lipitor  Chronic normocytic anemia likely from CKD Hemoglobin stable No signs of bleeding  History of COVID-19 pneumonia Currently afebrile Repeat Covid test on this admission negative Tested positive on 09/24/2018    DVT prophylaxis:  Subcu Lovenox daily Code Status: Full code. Family Communication:  None at bedside Disposition Plan:  Possible discharge  in the next 48 to 72 hours when cardiology signs off and patient is  hemodynamically stable. Consults called: Cardiology.     Objective: Vitals:   02/13/19 1444 02/13/19 1945 02/13/19 2030 02/14/19 0629  BP: 138/62  140/60 115/66  Pulse: 74  72 70  Resp:      Temp: 98.7 F (37.1 C)  98.3 F (36.8 C) 98.4 F (36.9 C)  TempSrc: Oral  Oral Oral  SpO2: 95% 99% 98% 97%  Weight:    72.7 kg  Height:        Intake/Output Summary (Last 24 hours) at 02/14/2019 1146 Last data filed at 02/14/2019 0500 Gross per 24 hour  Intake -  Output 300 ml  Net -300 ml   Filed Weights   02/12/19 0449 02/13/19 0522 02/14/19 0629  Weight: 77.4 kg 73.2 kg 72.7 kg    Exam:  General: NAD, acutely ill-appearing  Cardiovascular: S1, S2 present  Respiratory:  Bibasilar crackles noted  Abdomen: Soft, nontender, nondistended, bowel sounds present  Musculoskeletal: Trace bilateral pedal edema noted  Skin: Normal  Psychiatry: Normal mood    Data Reviewed: CBC: Recent Labs  Lab 02/08/19 2330 02/09/19 0615 02/10/19 0226 02/11/19 0359 02/11/19 1139 02/11/19 1144 02/11/19 1145 02/12/19 0315 02/14/19 0333  WBC 9.9 8.9 5.3 4.9  --   --   --  4.3 3.7*  NEUTROABS 8.8* 7.4 3.8  --   --   --   --   --  2.6  HGB 11.6* 9.5* 8.3* 8.2* 8.8* 8.5* 7.5* 8.2* 7.6*  HCT 37.6 31.6* 27.0* 26.7* 26.0* 25.0* 22.0* 26.7* 25.3*  MCV 90.8 90.5 89.7 89.0  --   --   --  88.7 90.7  PLT 320 284 234 214  --   --   --  223 384   Basic Metabolic Panel: Recent Labs  Lab 02/09/19 0615 02/10/19 0226 02/11/19 0531  02/11/19 1144 02/11/19 1145 02/12/19 0315 02/13/19 0247 02/14/19 0333  NA 138 139 137   < > 137 144 137 138 139  K 4.0 3.7 4.5   < > 4.1 3.4* 4.7 4.3 4.7  CL 99 102 99  --   --   --  100 99 99  CO2 29 29 29   --   --   --  26 29 32  GLUCOSE 181* 94 86  --   --   --  110* 98 50*  BUN 39* 35* 38*  --   --   --  38* 39* 42*  CREATININE 1.39* 1.59* 1.88*  --   --   --  1.64* 1.89* 2.28*  CALCIUM 9.3 9.1 9.3  --   --   --  9.2 9.2 9.2  MG 2.1 1.9  --   --   --    --   --   --   --   PHOS  --  3.1  --   --   --   --   --   --   --    < > = values in this interval not displayed.   GFR: Estimated Creatinine Clearance: 24.9 mL/min (A) (by C-G formula based on SCr of 2.28 mg/dL (H)). Liver Function Tests: Recent Labs  Lab 02/09/19 0615 02/10/19 0226  AST 21 17  ALT 12 10  ALKPHOS 64 52  BILITOT 0.1* 0.2*  PROT 5.8* 4.9*  ALBUMIN 2.6* 2.1*   No results for input(s): LIPASE, AMYLASE in the last 168 hours. No results for  input(s): AMMONIA in the last 168 hours. Coagulation Profile: Recent Labs  Lab 02/09/19 0258  INR 1.1   Cardiac Enzymes: No results for input(s): CKTOTAL, CKMB, CKMBINDEX, TROPONINI in the last 168 hours. BNP (last 3 results) No results for input(s): PROBNP in the last 8760 hours. HbA1C: No results for input(s): HGBA1C in the last 72 hours. CBG: Recent Labs  Lab 02/13/19 2028 02/14/19 0028 02/14/19 0608 02/14/19 0711 02/14/19 1131  GLUCAP 154* 107* 38* 98 138*   Lipid Profile: No results for input(s): CHOL, HDL, LDLCALC, TRIG, CHOLHDL, LDLDIRECT in the last 72 hours. Thyroid Function Tests: No results for input(s): TSH, T4TOTAL, FREET4, T3FREE, THYROIDAB in the last 72 hours. Anemia Panel: No results for input(s): VITAMINB12, FOLATE, FERRITIN, TIBC, IRON, RETICCTPCT in the last 72 hours. Urine analysis:    Component Value Date/Time   COLORURINE YELLOW 01/28/2019 0714   APPEARANCEUR TURBID (A) 01/28/2019 0714   LABSPEC 1.015 01/28/2019 0714   PHURINE 5.0 01/28/2019 0714   GLUCOSEU 50 (A) 01/28/2019 0714   HGBUR NEGATIVE 01/28/2019 0714   BILIRUBINUR NEGATIVE 01/28/2019 0714   BILIRUBINUR negative 12/13/2017 [place] 01/28/2019 0714   PROTEINUR >=300 (A) 01/28/2019 0714   UROBILINOGEN 0.2 12/13/2017 1514   NITRITE NEGATIVE 01/28/2019 0714   LEUKOCYTESUR MODERATE (A) 01/28/2019 0714   Sepsis Labs: @LABRCNTIP (procalcitonin:4,lacticidven:4)  ) Recent Results (from the past 240 hour(s))   SARS CORONAVIRUS 2 (TAT 6-24 HRS) Nasopharyngeal Nasopharyngeal Swab     Status: None   Collection Time: 02/09/19  3:00 AM   Specimen: Nasopharyngeal Swab  Result Value Ref Range Status   SARS Coronavirus 2 NEGATIVE NEGATIVE Final    Comment: (NOTE) SARS-CoV-2 target nucleic acids are NOT DETECTED. The SARS-CoV-2 RNA is generally detectable in upper and lower respiratory specimens during the acute phase of infection. Negative results do not preclude SARS-CoV-2 infection, do not rule out co-infections with other pathogens, and should not be used as the sole basis for treatment or other patient management decisions. Negative results must be combined with clinical observations, patient history, and epidemiological information. The expected result is Negative. Fact Sheet for Patients: SugarRoll.be Fact Sheet for Healthcare Providers: https://www.woods-mathews.com/ This test is not yet approved or cleared by the [place] FDA and  has been authorized for detection and/or diagnosis of SARS-CoV-2 by FDA under an Emergency Use Authorization (EUA). This EUA will remain  in effect (meaning this test can be used) for the duration of the COVID-19 declaration under Section 56 4(b)(1) of the Act, 21 U.S.C. section 360bbb-3(b)(1), unless the authorization is terminated or revoked sooner. Performed at [facility], [place] [address], [place], [place] [address]       Studies: Dg Chest Port 1 View  Result Date: 02/14/2019 CLINICAL DATA:  Dyspnea.  History of hypertension and CHF. EXAM: PORTABLE CHEST 1 VIEW COMPARISON:  02/10/2019. FINDINGS: Cardiac silhouette is mildly enlarged. No mediastinal or hilar masses. There is vascular congestion with mild interstitial thickening bilaterally, improved from the most recent prior study. Lung base opacities are also improved, but not resolved, consistent with a combination of atelectasis and small pleural  effusions. No pneumothorax. IMPRESSION: Improved, but not resolved, congestive heart failure. No new abnormalities. Electronically Signed   By: Lajean Manes M.D.   On: 02/14/2019 08:33    Scheduled Meds: . allopurinol  150 mg Oral Daily  . aspirin EC  81 mg Oral Daily  . atorvastatin  80 mg Oral q1800  . benzonatate  200 mg  Oral TID  . carvedilol  25 mg Oral BID WC  . DULoxetine  30 mg Oral Daily  . enoxaparin (LOVENOX) injection  30 mg Subcutaneous Q24H  . gabapentin  100 mg Oral BID  . insulin aspart  0-9 Units Subcutaneous Q4H  . isosorbide-hydrALAZINE  1 tablet Oral TID  . pantoprazole  20 mg Oral Daily  . sodium chloride flush  3 mL Intravenous Q12H  . sodium chloride flush  3 mL Intravenous Q12H    Continuous Infusions: . sodium chloride       LOS: 5 days     Alma Friendly, MD [facility]  If 7PM-7AM, please contact night-coverage [email or web address] 02/14/2019, 11:46 AM

## 2019-02-14 NOTE — Progress Notes (Signed)
Hypoglycemic Event  CBG: 67  Treatment: 4 oz juice/soda  Symptoms: Shaky  Follow-up CBG: Time:0056 CBG Result:96  Possible Reasons for Event: Inadequate meal intake  Comments/MD notified:MD on call    Troy Kanouse, [employer]

## 2019-02-15 LAB — CBC WITH DIFFERENTIAL/PLATELET
Abs Immature Granulocytes: 0.02 10*3/uL (ref 0.00–0.07)
Basophils Absolute: 0 10*3/uL (ref 0.0–0.1)
Basophils Relative: 1 %
Eosinophils Absolute: 0.1 10*3/uL (ref 0.0–0.5)
Eosinophils Relative: 3 %
HCT: 24.9 % — ABNORMAL LOW (ref 36.0–46.0)
Hemoglobin: 7.5 g/dL — ABNORMAL LOW (ref 12.0–15.0)
Immature Granulocytes: 1 %
Lymphocytes Relative: 15 %
Lymphs Abs: 0.6 10*3/uL — ABNORMAL LOW (ref 0.7–4.0)
MCH: 27.3 pg (ref 26.0–34.0)
MCHC: 30.1 g/dL (ref 30.0–36.0)
MCV: 90.5 fL (ref 80.0–100.0)
Monocytes Absolute: 0.5 10*3/uL (ref 0.1–1.0)
Monocytes Relative: 11 %
Neutro Abs: 3 10*3/uL (ref 1.7–7.7)
Neutrophils Relative %: 69 %
Platelets: 207 10*3/uL (ref 150–400)
RBC: 2.75 MIL/uL — ABNORMAL LOW (ref 3.87–5.11)
RDW: 15.1 % (ref 11.5–15.5)
WBC: 4.2 10*3/uL (ref 4.0–10.5)
nRBC: 0 % (ref 0.0–0.2)

## 2019-02-15 LAB — GLUCOSE, CAPILLARY
Glucose-Capillary: 101 mg/dL — ABNORMAL HIGH (ref 70–99)
Glucose-Capillary: 104 mg/dL — ABNORMAL HIGH (ref 70–99)
Glucose-Capillary: 105 mg/dL — ABNORMAL HIGH (ref 70–99)
Glucose-Capillary: 134 mg/dL — ABNORMAL HIGH (ref 70–99)
Glucose-Capillary: 149 mg/dL — ABNORMAL HIGH (ref 70–99)
Glucose-Capillary: 91 mg/dL (ref 70–99)

## 2019-02-15 LAB — BASIC METABOLIC PANEL
Anion gap: 11 (ref 5–15)
BUN: 45 mg/dL — ABNORMAL HIGH (ref 8–23)
CO2: 29 mmol/L (ref 22–32)
Calcium: 8.9 mg/dL (ref 8.9–10.3)
Chloride: 97 mmol/L — ABNORMAL LOW (ref 98–111)
Creatinine, Ser: 2.37 mg/dL — ABNORMAL HIGH (ref 0.44–1.00)
GFR calc Af Amer: 24 mL/min — ABNORMAL LOW (ref >60)
GFR calc non Af Amer: 21 mL/min — ABNORMAL LOW (ref >60)
Glucose, Bld: 121 mg/dL — ABNORMAL HIGH (ref 70–99)
Potassium: 4.8 mmol/L (ref 3.5–5.1)
Sodium: 137 mmol/L (ref 135–145)

## 2019-02-15 MED ORDER — FUROSEMIDE 10 MG/ML IJ SOLN
40.0000 mg | Freq: Every day | INTRAMUSCULAR | Status: DC
  Administered 2019-02-15: 40 mg via INTRAVENOUS
  Filled 2019-02-15: qty 4

## 2019-02-15 NOTE — Progress Notes (Signed)
Progress Note  Patient Name: Kara Hanson Date of Encounter: 02/15/2019  Primary Cardiologist: Candee Furbish, MD   Subjective   No CP; mild dyspnea  Inpatient Medications    Scheduled Meds: . allopurinol  150 mg Oral Daily  . aspirin EC  81 mg Oral Daily  . atorvastatin  80 mg Oral q1800  . benzonatate  200 mg Oral TID  . carvedilol  25 mg Oral BID WC  . DULoxetine  30 mg Oral Daily  . enoxaparin (LOVENOX) injection  30 mg Subcutaneous Q24H  . gabapentin  100 mg Oral BID  . insulin aspart  0-9 Units Subcutaneous Q4H  . isosorbide-hydrALAZINE  1 tablet Oral TID  . pantoprazole  20 mg Oral Daily  . sodium chloride flush  3 mL Intravenous Q12H  . sodium chloride flush  3 mL Intravenous Q12H   Continuous Infusions: . sodium chloride     PRN Meds: sodium chloride, acetaminophen **OR** acetaminophen, bisacodyl, dextrose, ondansetron **OR** ondansetron (ZOFRAN) IV, polyethylene glycol, sodium chloride flush   Vital Signs    Vitals:   02/14/19 1400 02/14/19 2048 02/15/19 0101 02/15/19 0450  BP: (!) 133/56 (!) 127/53 (!) 130/51 (!) 130/59  Pulse: 75 76 79 76  Resp: (!) 24 17    Temp: 98.6 F (37 C) 98.6 F (37 C)  98.9 F (37.2 C)  TempSrc: Oral Oral  Oral  SpO2:  97% 99%   Weight:    73.4 kg  Height:        Intake/Output Summary (Last 24 hours) at 02/15/2019 0756 Last data filed at 02/15/2019 0300 Gross per 24 hour  Intake 1017 ml  Output 650 ml  Net 367 ml   Last 3 Weights 02/15/2019 02/14/2019 02/13/2019  Weight (lbs) 161 lb 12.8 oz 160 lb 4.8 oz 161 lb 4.8 oz  Weight (kg) 73.392 kg 72.712 kg 73.165 kg      Telemetry    Sinus with occasional PVC- Personally Reviewed  Physical Exam   GEN: WD WN NAD Neck: supple Cardiac: RRR Respiratory: Diminished BS bases; no rhonchi GI: NT/ND, soft, no masses MS: No edema Neuro:  Grossly intact   Labs    High Sensitivity Troponin:   Recent Labs  Lab 01/23/19 2034 02/08/19 2330 02/09/19 0114 02/09/19  0615 02/10/19 0919  TROPONINIHS 13 25* 660* 3,166* 1,015*      Chemistry Recent Labs  Lab 02/09/19 0615 02/10/19 0226  02/13/19 0247 02/14/19 0333 02/15/19 0333  NA 138 139   < > 138 139 137  K 4.0 3.7   < > 4.3 4.7 4.8  CL 99 102   < > 99 99 97*  CO2 29 29   < > 29 32 29  GLUCOSE 181* 94   < > 98 50* 121*  BUN 39* 35*   < > 39* 42* 45*  CREATININE 1.39* 1.59*   < > 1.89* 2.28* 2.37*  CALCIUM 9.3 9.1   < > 9.2 9.2 8.9  PROT 5.8* 4.9*  --   --   --   --   ALBUMIN 2.6* 2.1*  --   --   --   --   AST 21 17  --   --   --   --   ALT 12 10  --   --   --   --   ALKPHOS 64 52  --   --   --   --   BILITOT 0.1* 0.2*  --   --   --   --  GFRNONAA 40* 34*   < > 28* 22* 21*  GFRAA 46* 39*   < > 32* 25* 24*  ANIONGAP 10 8   < > 10 8 11    < > = values in this interval not displayed.     Hematology Recent Labs  Lab 02/12/19 0315 02/14/19 0333 02/15/19 0333  WBC 4.3 3.7* 4.2  RBC 3.01* 2.79* 2.75*  HGB 8.2* 7.6* 7.5*  HCT 26.7* 25.3* 24.9*  MCV 88.7 90.7 90.5  MCH 27.2 27.2 27.3  MCHC 30.7 30.0 30.1  RDW 14.7 14.9 15.1  PLT 223 215 207    BNP Recent Labs  Lab 02/08/19 2330 02/10/19 0700  BNP 380.8* 379.3*     Patient Profile     63 y.o. female with past medical history of stress-induced cardiomyopathy, diabetes mellitus, hypertension, chronic renal insufficiency admitted with recurrent congestive heart failure and elevated troponin.  Echocardiogram shows ejection fraction 40 to 45%, moderate left ventricular hypertrophy, apical hypokinesis, mild mitral regurgitation.  Follow-up echocardiogram this admission shows ejection fraction 30 to 35%, mild aortic stenosis (peak velocity of 2 m/s in setting of reduced LV function, no gradient reported), mild mitral regurgitation and moderate tricuspid regurgitation. Cardiac catheterization on November 9 showed nonobstructive coronary disease and pulmonary capillary wedge pressure 26.  Assessment & Plan   1 acute combined  systolic/diastolic congestive heart failure-pulmonary capillary wedge pressure 26 at time of catheterization.  I/O+367.  Patient symptoms have improved but not back to baseline.  Her renal function has deteriorated with diuresis similar to previous.  Will resume Lasix 40 mg IV daily and follow closely.    2 cardiomyopathy-etiology unclear.  Cardiac catheterization showed nonobstructive coronary disease.  Will continue beta-blocker, hydralazine and nitrates. Will not add ARB or Entresto at this point given renal insufficiency.  Titrate medications and repeat echocardiogram in 3 months.  If LV function remains reduced with ejection fraction less than 35% would need to consider ICD.  3 chronic stage III kidney disease-renal function worse today.  Diuretics were held yesterday but she has persistent CHF on chest x-ray though improved.  Will resume Lasix 40 mg IV daily.  Would ask nephrology to evaluate as well.  4 question aortic stenosis-not well evaluated on recent echocardiogram.  S2 is not diminished on examination.  We will plan follow-up echoes in the future.  5 normocytic anemia-possibly secondary to renal insufficiency.  Per primary care.  6 CAD-continue ASA and statin.   For questions or updates, please contact [place] Please consult [email or web address] for contact info under        Signed, Kirk Ruths, MD  02/15/2019, 7:56 AM

## 2019-02-15 NOTE — Plan of Care (Signed)
  Problem: Education: Goal: Knowledge of General Education information will improve Description: Including pain rating scale, medication(s)/side effects and non-pharmacologic comfort measures Outcome: Progressing   Problem: Health Behavior/Discharge Planning: Goal: Ability to manage health-related needs will improve Outcome: Progressing   Problem: Clinical Measurements: Goal: Ability to maintain clinical measurements within normal limits will improve Outcome: Progressing Goal: Will remain free from infection Outcome: Progressing Goal: Diagnostic test results will improve Outcome: Progressing   Problem: Activity: Goal: Risk for activity intolerance will decrease Outcome: Progressing   Problem: Coping: Goal: Level of anxiety will decrease Outcome: Progressing   Problem: Safety: Goal: Ability to remain free from injury will improve Outcome: Progressing  Elesa Hacker, RN

## 2019-02-15 NOTE — Plan of Care (Signed)
  Problem: Clinical Measurements: Goal: Diagnostic test results will improve Outcome: Progressing Goal: Respiratory complications will improve Outcome: Progressing   

## 2019-02-15 NOTE — Progress Notes (Signed)
PROGRESS NOTE  Kara Hanson GYJ:[id number] DOB: 06/19/1955 DOA: 02/08/2019 PCP: [name], MD  HPI/Recap of past 24 hours: Kara Hanson is a 63 y.o. female with history of nonischemic cardiomyopathy last EF measured was 45% last month diabetes mellitus type 2, chronic kidney disease stage III, hypertension just discharged home yesterday after being admitted for acute CHF and patient also was recently admitted in April 2020 for Covid pneumonia subsequent which patient also was admitted for community-acquired pneumonia and August 2020 presents to the ER with complaint of sudden onset of worsening shortness of breath since yesterday afternoon on the day after discharge.  Patient also started developing retrosternal chest pressure with no associated fever chills nausea vomiting productive cough.  EMS was called and patient was brought to the ER. In the ER patient was requiring 100% nonrebreather and eventually was placed on BiPAP. Chest x-ray is consistent with CHF.  EKG shows sinus rhythm with PVCs with nonspecific T wave changes.  Initial troponin was around 25 and repeat was 660.  BNP was 380.  Patient was given Lasix total of 80 mg IV.  Cardiology on-call was consulted requested starting heparin and nitroglycerin infusion.  Patient admitted for acute CHF and possible non-STEMI.  COVID-19 test negative.     Today, patient reported feeling slightly better, still with dyspnea, not at her baseline.  Denies any chest pain, abdominal pain, nausea/vomiting, fever/chills.   Assessment/Plan: Principal Problem:   Acute respiratory failure with hypoxia (HCC) Active Problems:   Controlled type 2 diabetes mellitus with hyperglycemia (HCC)   Acute on chronic combined systolic and diastolic CHF (congestive heart failure) (HCC)   CKD (chronic kidney disease), stage III   NSTEMI (non-ST elevated myocardial infarction) ([place])  Acute on chronic hypoxic respiratory failure likely secondary to acute on  chronic combined diastolic and systolic CHF.   At baseline she is on 4 L of oxygen by nasal cannula continuously Currently on 4 L with O2 saturation well above 95% Chest x-ray done on admission which showed cardiomegaly with increase in pulmonary vascularity right greater than left suggestive of pulmonary edema, repeat chest x-ray showed improving congestion, no new abnormalities Continue to maintain O2 saturation greater than 92%  Acute on chronic combined systolic and diastolic CHF BNP 263 with 02/10/2019 Last 2D echo with LVEF 40-45% on 01/24/2019. Repeated TTE done on 02/10/2019 showed LVEF 30 to 35% with grade 3 diastolic dysfunction. Continue strict I's and O's and daily weight Net INO -1647 Cardiology on board, restarted IV Lasix, continue Coreg, BiDil  AKI on CKD 3 Worsening creatinine, 2.28--> 2.37 Baseline creatinine appears to be 1.3 with GFR 46 Nephrology consulted Losartan discontinued per cardiology Daily BMP  Cough likely secondary to pulmonary edema in the setting of acute on chronic systolic CHF Continue Tessalon Perles increase dose to 200 mg 3 times daily  Elevated troponin Troponin is peaked at greater than 3100 and trending down  RHC/LHC 02/11/2019 showed nonobstructive coronary artery disease Cardiology on board  DM 2 with hypoglycemia A1c 7.8 Held Lantus Continue sensitive insulin sliding scale, hypoglycemic protocol, Accu-Cheks  History of gout Continue allopurinol  Hyperlipidemia Continue Lipitor  Chronic normocytic anemia likely from CKD Hemoglobin stable No signs of bleeding  History of COVID-19 pneumonia Currently afebrile Repeat Covid test on this admission negative Tested positive on 09/24/2018    DVT prophylaxis:  Subcu Lovenox daily Code Status: Full code. Family Communication:  None at bedside Disposition Plan:  To be determined, likely home Consults called: Cardiology, nephrology  Objective: Vitals:   02/14/19 1400  02/14/19 2048 02/15/19 0101 02/15/19 0450  BP: (!) 133/56 (!) 127/53 (!) 130/51 (!) 130/59  Pulse: 75 76 79 76  Resp: (!) 24 17    Temp: 98.6 F (37 C) 98.6 F (37 C)  98.9 F (37.2 C)  TempSrc: Oral Oral  Oral  SpO2:  97% 99%   Weight:    73.4 kg  Height:        Intake/Output Summary (Last 24 hours) at 02/15/2019 1439 Last data filed at 02/15/2019 0300 Gross per 24 hour  Intake 657 ml  Output 650 ml  Net 7 ml   Filed Weights   02/13/19 0522 02/14/19 0629 02/15/19 0450  Weight: 73.2 kg 72.7 kg 73.4 kg    Exam:  General: NAD, chronically ill-appearing  Cardiovascular: S1, S2 present  Respiratory:  Bibasilar crackles noted  Abdomen: Soft, nontender, nondistended, bowel sounds present  Musculoskeletal: Trace bilateral pedal edema noted  Skin: Normal  Psychiatry: Normal mood   Data Reviewed: CBC: Recent Labs  Lab 02/08/19 2330 02/09/19 0615 02/10/19 0226 02/11/19 0359  02/11/19 1144 02/11/19 1145 02/12/19 0315 02/14/19 0333 02/15/19 0333  WBC 9.9 8.9 5.3 4.9  --   --   --  4.3 3.7* 4.2  NEUTROABS 8.8* 7.4 3.8  --   --   --   --   --  2.6 3.0  HGB 11.6* 9.5* 8.3* 8.2*   < > 8.5* 7.5* 8.2* 7.6* 7.5*  HCT 37.6 31.6* 27.0* 26.7*   < > 25.0* 22.0* 26.7* 25.3* 24.9*  MCV 90.8 90.5 89.7 89.0  --   --   --  88.7 90.7 90.5  PLT 320 284 234 214  --   --   --  223 215 207   < > = values in this interval not displayed.   Basic Metabolic Panel: Recent Labs  Lab 02/09/19 0615 02/10/19 0226 02/11/19 0531  02/11/19 1145 02/12/19 0315 02/13/19 0247 02/14/19 0333 02/15/19 0333  NA 138 139 137   < > 144 137 138 139 137  K 4.0 3.7 4.5   < > 3.4* 4.7 4.3 4.7 4.8  CL 99 102 99  --   --  100 99 99 97*  CO2 29 29 29   --   --  26 29 32 29  GLUCOSE 181* 94 86  --   --  110* 98 50* 121*  BUN 39* 35* 38*  --   --  38* 39* 42* 45*  CREATININE 1.39* 1.59* 1.88*  --   --  1.64* 1.89* 2.28* 2.37*  CALCIUM 9.3 9.1 9.3  --   --  9.2 9.2 9.2 8.9  MG 2.1 1.9  --   --   --    --   --   --   --   PHOS  --  3.1  --   --   --   --   --   --   --    < > = values in this interval not displayed.   GFR: Estimated Creatinine Clearance: 24.1 mL/min (A) (by C-G formula based on SCr of 2.37 mg/dL (H)). Liver Function Tests: Recent Labs  Lab 02/09/19 0615 02/10/19 0226  AST 21 17  ALT 12 10  ALKPHOS 64 52  BILITOT 0.1* 0.2*  PROT 5.8* 4.9*  ALBUMIN 2.6* 2.1*   No results for input(s): LIPASE, AMYLASE in the last 168 hours. No results for input(s): AMMONIA in the last 168  hours. Coagulation Profile: Recent Labs  Lab 02/09/19 0258  INR 1.1   Cardiac Enzymes: No results for input(s): CKTOTAL, CKMB, CKMBINDEX, TROPONINI in the last 168 hours. BNP (last 3 results) No results for input(s): PROBNP in the last 8760 hours. HbA1C: No results for input(s): HGBA1C in the last 72 hours. CBG: Recent Labs  Lab 02/14/19 2045 02/15/19 0059 02/15/19 0451 02/15/19 0816 02/15/19 1115  GLUCAP 96 134* 104* 91 149*   Lipid Profile: No results for input(s): CHOL, HDL, LDLCALC, TRIG, CHOLHDL, LDLDIRECT in the last 72 hours. Thyroid Function Tests: No results for input(s): TSH, T4TOTAL, FREET4, T3FREE, THYROIDAB in the last 72 hours. Anemia Panel: No results for input(s): VITAMINB12, FOLATE, FERRITIN, TIBC, IRON, RETICCTPCT in the last 72 hours. Urine analysis:    Component Value Date/Time   COLORURINE YELLOW 01/28/2019 0714   APPEARANCEUR TURBID (A) 01/28/2019 0714   LABSPEC 1.015 01/28/2019 0714   PHURINE 5.0 01/28/2019 0714   GLUCOSEU 50 (A) 01/28/2019 0714   HGBUR NEGATIVE 01/28/2019 0714   BILIRUBINUR NEGATIVE 01/28/2019 0714   BILIRUBINUR negative 12/13/2017 [place] 01/28/2019 0714   PROTEINUR >=300 (A) 01/28/2019 0714   UROBILINOGEN 0.2 12/13/2017 1514   NITRITE NEGATIVE 01/28/2019 0714   LEUKOCYTESUR MODERATE (A) 01/28/2019 0714   Sepsis Labs: @LABRCNTIP (procalcitonin:4,lacticidven:4)  ) Recent Results (from the past 240 hour(s))   SARS CORONAVIRUS 2 (TAT 6-24 HRS) Nasopharyngeal Nasopharyngeal Swab     Status: None   Collection Time: 02/09/19  3:00 AM   Specimen: Nasopharyngeal Swab  Result Value Ref Range Status   SARS Coronavirus 2 NEGATIVE NEGATIVE Final    Comment: (NOTE) SARS-CoV-2 target nucleic acids are NOT DETECTED. The SARS-CoV-2 RNA is generally detectable in upper and lower respiratory specimens during the acute phase of infection. Negative results do not preclude SARS-CoV-2 infection, do not rule out co-infections with other pathogens, and should not be used as the sole basis for treatment or other patient management decisions. Negative results must be combined with clinical observations, patient history, and epidemiological information. The expected result is Negative. Fact Sheet for Patients: SugarRoll.be Fact Sheet for Healthcare Providers: https://www.woods-mathews.com/ This test is not yet approved or cleared by the [place] FDA and  has been authorized for detection and/or diagnosis of SARS-CoV-2 by FDA under an Emergency Use Authorization (EUA). This EUA will remain  in effect (meaning this test can be used) for the duration of the COVID-19 declaration under Section 56 4(b)(1) of the Act, 21 U.S.C. section 360bbb-3(b)(1), unless the authorization is terminated or revoked sooner. Performed at [facility], [place] [address], [place], [place] [address]       Studies: No results found.  Scheduled Meds: . allopurinol  150 mg Oral Daily  . aspirin EC  81 mg Oral Daily  . atorvastatin  80 mg Oral q1800  . benzonatate  200 mg Oral TID  . carvedilol  25 mg Oral BID WC  . DULoxetine  30 mg Oral Daily  . enoxaparin (LOVENOX) injection  30 mg Subcutaneous Q24H  . furosemide  40 mg Intravenous Daily  . gabapentin  100 mg Oral BID  . insulin aspart  0-9 Units Subcutaneous Q4H  . isosorbide-hydrALAZINE  1 tablet Oral TID  . pantoprazole  20  mg Oral Daily  . sodium chloride flush  3 mL Intravenous Q12H  . sodium chloride flush  3 mL Intravenous Q12H    Continuous Infusions: . sodium chloride       LOS:  6 days     Alma Friendly, MD [facility]  If 7PM-7AM, please contact night-coverage [email or web address] 02/15/2019, 2:39 PM

## 2019-02-15 NOTE — Progress Notes (Signed)
Physical Therapy Treatment Patient Details Name: Kara Hanson MRN: [id number] DOB: [birth year] Today's Date: 02/15/2019    History of Present Illness Patient is a 63 y/o female who presents with SOB. Admitted with acute combined systolic/diastolic congestive heart failure. PMH includes CHF, COVID positive 4/9-4/22 and COVID with PNA 6/22, CVA, HTN, DM.    PT Comments    Patient seen for mobility progression. Pt requires supervision/min guard for functional transfers and gait training with RW and min A for stair training. Pt able to ambulate 120 ft and ascend/descend 10 steps on 4-6L O2 via [place]. Pt becomes SOB with mobility and requires standing rest break and PLB to recover. SpO2 87-96% during session. Pt will continue to benefit from further skilled PT services to maximize independence and safety with mobility.     Follow Up Recommendations  Home health PT;Supervision for mobility/OOB     Equipment Recommendations  None recommended by PT    Recommendations for Other Services       Precautions / Restrictions Precautions Precautions: Fall Precaution Comments: 4L/min 02    Mobility  Bed Mobility Overal bed mobility: Modified Independent Bed Mobility: Supine to Sit           General bed mobility comments: increased time/effort; use of rail  Transfers Overall transfer level: Needs assistance Equipment used: Rolling walker (2 wheeled) Transfers: Sit to/from Stand Sit to Stand: Supervision         General transfer comment: supervision for safety  Ambulation/Gait Ambulation/Gait assistance: Supervision Gait Distance (Feet): 120 Feet Assistive device: Rolling walker (2 wheeled) Gait Pattern/deviations: Step-through pattern;Decreased stride length Gait velocity: decr   General Gait Details: cues for PLB; slow, steady gait with bilat UE support   Stairs   Stairs assistance: Min assist Stair Management: One rail Left;Step to pattern;Sideways;Forwards Number of  Stairs: 10 General stair comments: cues for technique; increased time due to SOB so rest breaks needed   Wheelchair Mobility    Modified Rankin (Stroke Patients Only)       Balance Overall balance assessment: Needs assistance Sitting-balance support: No upper extremity supported;Feet supported Sitting balance-Leahy Scale: Good     Standing balance support: During functional activity;No upper extremity supported Standing balance-Leahy Scale: Fair                              Cognition Arousal/Alertness: Awake/alert Behavior During Therapy: WFL for tasks assessed/performed Overall Cognitive Status: Within Functional Limits for tasks assessed                                 General Comments: pt very soft spoken. interactive but grossly flat affect      Exercises      General Comments General comments (skin integrity, edema, etc.): SpO2 87-96% on 4L O2 via [place]      Pertinent Vitals/Pain Pain Assessment: No/denies pain    Home Living                      Prior Function            PT Goals (current goals can now be found in the care plan section) Progress towards PT goals: Progressing toward goals    Frequency    Min 3X/week      PT Plan Current plan remains appropriate    Co-evaluation  AM-PAC PT "6 Clicks" Mobility   Outcome Measure  Help needed turning from your back to your side while in a flat bed without using bedrails?: None Help needed moving from lying on your back to sitting on the side of a flat bed without using bedrails?: A Little Help needed moving to and from a bed to a chair (including a wheelchair)?: A Little Help needed standing up from a chair using your arms (e.g., wheelchair or bedside chair)?: A Little Help needed to walk in hospital room?: A Little Help needed climbing 3-5 steps with a railing? : A Little 6 Click Score: 19    End of Session Equipment Utilized During Treatment:  Oxygen;Gait belt Activity Tolerance: Patient tolerated treatment well Patient left: in chair;with call bell/phone within reach Nurse Communication: Mobility status PT Visit Diagnosis: Muscle weakness (generalized) (M62.81);Difficulty in walking, not elsewhere classified (R26.2)     Time: 1859-0931 PT Time Calculation (min) (ACUTE ONLY): 38 min  Charges:  $Gait Training: 38-52 mins                     Earney Navy, PTA Acute Rehabilitation Services Pager: [phone] Office: [phone]     Darliss Cheney 02/15/2019, 4:18 PM

## 2019-02-15 NOTE — Consult Note (Signed)
Kara [place] Admit Date: 02/08/2019 02/15/2019 Rexene Agent Requesting Physician:  Horris Latino MD  Reason for Consult:  [id number] HPI:  63 year old female with complex past medical history chronic systolic heart failure from nonischemic cardiomyopathy with recent admission and discharge on 02/08/2019 after prolonged stay for decompensated heart failure.  She also has type 2 diabetes, past history of Covid along with CAP, gout, hypertension.  We saw her during her previous admission when she had some acute on chronic renal failure.  There was a component of urinary retention based upon bladder scanning requiring bladder catheterization.  Creatinine slowly improved to a discharge value of 1.4, close to her baseline.  She represented to the emergency room within 24 hours on 11/7 with recurrence of dyspnea and was thought to be in CHF.  She has been followed by [facility] and cardiology.  Her creatinine has been labile but increased to 2.37 today from 2.28 yesterday and 1.39 at presentation.  Her weights are largely unchanged compared to presentation, further, there are 4 kg less than her discharge weight on 11/6.  By I's and O's she is approximately 1.3 L negative from presentation.  She is currently on 4 L nasal cannula, and I believe she has chronic oxygen requirements at home.  Initial chest x-ray had features consistent with CHF, repeat imaging on 11/12 had improved but not resolved CHF findings including vascular congestion and interstitial thickening.  She received a dose of furosemide 40 mg IV today.  Labs are notable for a potassium of 4.8, bicarbonate 29, creatinine of 2.37, BUN 45.  Hemoglobin is 7.5.  Urine has been collected by spontaneous voiding or pure wick.  Discussed with nursing, have not had any post void residuals.  Renal ultrasound during previous hospitalization showed normal-sized kidneys without structural issues.  Bladder had a Foley catheter within it.  She just finished working  with PT, she did some work on the stairs.  She required 6 L of oxygen because of dyspnea but had maintained her SPO2.   Creatinine, Ser (mg/dL)  Date Value  02/15/2019 2.37 (H)  02/14/2019 2.28 (H)  02/13/2019 1.89 (H)  02/12/2019 1.64 (H)  02/11/2019 1.88 (H)  02/10/2019 1.59 (H)  02/09/2019 1.39 (H)  02/08/2019 1.41 (H)  02/07/2019 1.65 (H)  02/06/2019 2.03 (H)  02/06/2019 2.12 (H)  ] I/Os: I/O last 3 completed shifts: In: 1017 [P.O.:1017] Out: 950 [Urine:950]   ROS NSAIDS: No exposure IV Contrast no exposure TMP/SMX no exposure Hypotension not present Balance of 12 systems is negative w/ exceptions as above  PMH  Past Medical History:  Diagnosis Date  . CHF (congestive heart failure) ([place])   . CVA (cerebral vascular accident) ([place])   . Diabetes mellitus without complication ([place])   . Hypercholesteremia   . Hypertension   . Myocardial infarction ([place])   . Pneumonia 11/01/2018  . Spinal stenosis    PSH  Past Surgical History:  Procedure Laterality Date  . BIOPSY  01/27/2019   Procedure: BIOPSY;  Surgeon: Otis Brace, MD;  Location: [facility];  Service: Gastroenterology;;  . BLADDER SURGERY    . CARDIAC CATHETERIZATION N/A 04/25/2016   Procedure: Left Heart Cath and Coronary Angiography;  Surgeon: Lorretta Harp, MD;  Location: [facility];  Service: Cardiovascular;  Laterality: N/A;  . CARDIAC CATHETERIZATION  02/11/2019  . CESAREAN SECTION    . CHOLECYSTECTOMY    . ESOPHAGOGASTRODUODENOSCOPY (EGD) WITH PROPOFOL N/A 01/27/2019   Procedure: ESOPHAGOGASTRODUODENOSCOPY (EGD) WITH PROPOFOL;  Surgeon: Otis Brace, MD;  Location: [facility];  Service: Gastroenterology;  Laterality: N/A;  . RIGHT/LEFT HEART CATH AND CORONARY ANGIOGRAPHY N/A 02/11/2019   Procedure: RIGHT/LEFT HEART CATH AND CORONARY ANGIOGRAPHY;  Surgeon: Jolaine Artist, MD;  Location: [facility];  Service: Cardiovascular;  Laterality: N/A;  . VIDEO BRONCHOSCOPY  N/A 02/01/2019   Procedure: VIDEO BRONCHOSCOPY WITHOUT FLUORO;  Surgeon: Candee Furbish, MD;  Location: [facility];  Service: Endoscopy;  Laterality: N/A;   FH  Family History  Problem Relation Age of Onset  . Diabetes Mellitus II Father   . Stroke Father   . Healthy Mother        She is 63 years old.    SH  reports that she has never smoked. She has never used smokeless tobacco. She reports that she does not drink alcohol or use drugs. Allergies  Allergies  Allergen Reactions  . Garlic Shortness Of Breath, Itching and Swelling    Hand itching and swelling  . Latex Itching  . Morphine And Related Itching and Other (See Comments)    Headache   . Other Itching    Reaction to newspaper ink -itching and headache   Home medications Prior to Admission medications   Medication Sig Start Date End Date Taking? Authorizing Provider  allopurinol (ZYLOPRIM) 300 MG tablet Take 0.5 tablets (150 mg total) by mouth daily. 02/06/19 03/08/19 Yes Bonnell Public, MD  Amino Acids-Protein Hydrolys (FEEDING SUPPLEMENT, PRO-STAT SUGAR FREE 64,) LIQD Take 30 mLs by mouth 2 (two) times daily. 02/05/19  Yes Dana Allan I, MD  aspirin EC 81 MG tablet Take 81 mg by mouth daily.   Yes [provider]  carvedilol (COREG) 25 MG tablet Take 1 tablet (25 mg total) by mouth 2 (two) times daily with a meal. 02/05/19  Yes Bonnell Public, MD  ergocalciferol (DRISDOL) 1.25 MG (50000 UT) capsule Take 1 capsule (50,000 Units total) by mouth once a week for 4 doses. Patient taking differently: Take 50,000 Units by mouth every Sunday.  02/05/19 02/27/19 Yes Bonnell Public, MD  feeding supplement, ENSURE ENLIVE, (ENSURE ENLIVE) LIQD Take 237 mLs by mouth 3 (three) times daily between meals. 02/05/19  Yes Dana Allan I, MD  furosemide (LASIX) 40 MG tablet Take 1 tablet (40 mg total) by mouth daily. Start 11/8 02/07/19 03/09/19 Yes Georgette Shell, MD  gabapentin (NEURONTIN) 100 MG capsule  Take 1 capsule (100 mg total) by mouth 2 (two) times daily. 02/05/19  Yes Dana Allan I, MD  insulin aspart (NOVOLOG) 100 UNIT/ML injection 0 to 12 units subcutaneously 3 times daily before meals as per sliding scale 03/29/18  Yes Newlin, Enobong, MD  sodium chloride (OCEAN) 0.65 % SOLN nasal spray Place 1 spray into both nostrils as needed for congestion.   Yes [provider]  SUPER B COMPLEX/C CAPS Take 1 capsule by mouth daily.   Yes [provider]  Accu-Chek FastClix Lancets MISC USE AS DIRECTED TO TEST BLOOD SUGAR THREE TIMES DAILY 07/31/18   [name], MD  atorvastatin (LIPITOR) 80 MG tablet Take 1 tablet (80 mg total) by mouth daily at 6 PM. 01/08/19 02/07/19  [name], MD  Blood Glucose Monitoring Suppl (ACCU-CHEK GUIDE) w/Device KIT 1 each by Does not apply route 3 (three) times daily. 07/31/18   [name], MD  DULoxetine (CYMBALTA) 30 MG capsule Take 1 capsule (30 mg total) by mouth daily. 02/06/19   Bonnell Public, MD  EASY COMFORT PEN NEEDLES 31G X 5 MM  MISC USE FOUR TIMES PER DAY FOR INSULIN ADMINISTRATION Patient taking differently: 4 (four) times daily.  10/02/18   [name], MD  glucose blood (ACCU-CHEK GUIDE) test strip USE AS DIRECTED TO TEST BLOOD SUGAR THREE TIMES DAILY 07/31/18   [name], MD  Insulin Glargine (BASAGLAR KWIKPEN) 100 UNIT/ML SOPN Inject 0.05 mLs (5 Units total) into the skin at bedtime. Patient taking differently: Inject 35 Units into the skin at bedtime.  10/02/18   Bonnielee Haff, MD  isosorbide-hydrALAZINE (BIDIL) 20-37.5 MG tablet Take 1 tablet by mouth 3 (three) times daily. 02/05/19   Bonnell Public, MD  Lancet Device MISC Use as instructed 3 times daily 09/19/17   Rodell Perna A, PA-C  lansoprazole (PREVACID) 15 MG capsule Take 15 mg by mouth daily.  10/02/18   [provider]  metoCLOPramide (REGLAN) 5 MG tablet Take 1 tablet (5 mg total) by mouth 3 (three) times daily. 02/05/19 02/05/20   Bonnell Public, MD  Misc. Devices MISC Portable oxygen concentrator.  Diagnosis-chronic respiratory failure. 09/03/18   [name], MD  Misc. Devices MISC Rollaor with seat. Dx: Congestive Heart Failure 10/19/18   [name], MD  polyethylene glycol (MIRALAX / GLYCOLAX) 17 g packet Take 17 g by mouth daily. Patient taking differently: Take 17 g by mouth daily as needed for moderate constipation.  10/02/18   Bonnielee Haff, MD    Current Medications Scheduled Meds: . allopurinol  150 mg Oral Daily  . aspirin EC  81 mg Oral Daily  . atorvastatin  80 mg Oral q1800  . carvedilol  25 mg Oral BID WC  . DULoxetine  30 mg Oral Daily  . enoxaparin (LOVENOX) injection  30 mg Subcutaneous Q24H  . furosemide  40 mg Intravenous Daily  . gabapentin  100 mg Oral BID  . insulin aspart  0-9 Units Subcutaneous Q4H  . isosorbide-hydrALAZINE  1 tablet Oral TID  . pantoprazole  20 mg Oral Daily  . sodium chloride flush  3 mL Intravenous Q12H  . sodium chloride flush  3 mL Intravenous Q12H   Continuous Infusions: . sodium chloride     PRN Meds:.sodium chloride, acetaminophen **OR** acetaminophen, bisacodyl, dextrose, ondansetron **OR** ondansetron (ZOFRAN) IV, polyethylene glycol, sodium chloride flush  CBC Recent Labs  Lab 02/10/19 0226  02/12/19 0315 02/14/19 0333 02/15/19 0333  WBC 5.3   < > 4.3 3.7* 4.2  NEUTROABS 3.8  --   --  2.6 3.0  HGB 8.3*   < > 8.2* 7.6* 7.5*  HCT 27.0*   < > 26.7* 25.3* 24.9*  MCV 89.7   < > 88.7 90.7 90.5  PLT 234   < > 223 215 207   < > = values in this interval not displayed.   Basic Metabolic Panel Recent Labs  Lab 02/09/19 0615 02/10/19 0226 02/11/19 0531 02/11/19 1139 02/11/19 1144 02/11/19 1145 02/12/19 0315 02/13/19 0247 02/14/19 0333 02/15/19 0333  NA _0  K 4.0 3.7 4.5 4.2 4.1 3.4* 4.7 4.3 4.7 4.8  CL 99 102 99  --   --   --  100 99 99 97*  CO2 _1 --   --   --  26 29 32 29  GLUCOSE 181*  94 86  --   --   --  110* 98 50* 121*  BUN 39* 35* 38*  --   --   --  38* 39* 42* 45*  CREATININE  1.39* 1.59* 1.88*  --   --   --  1.64* 1.89* 2.28* 2.37*  CALCIUM 9.3 9.1 9.3  --   --   --  9.2 9.2 9.2 8.9  PHOS  --  3.1  --   --   --   --   --   --   --   --     Physical Exam  Blood pressure (!) 130/59, pulse 76, temperature 98.9 F (37.2 C), temperature source Oral, resp. rate 17, height _0  (1.651 m), weight 73.4 kg, SpO2 99 %. GEN: Chronically ill-appearing, NAD, sitting in chair, just finished working with PT ENT: NCAT EYES: EOMI CV: Regular, normal S1 and S2, no rub PULM: Diminished in the bases, clear throughout otherwise ABD: Soft, nontender SKIN: No rashes or lesions EXT: Trace edema NEURO: Nonfocal, CN II through XII intact   Assessment 63 year old female with readmission for dyspnea, concern for CHF symptoms, some acute on chronic renal failure with a baseline creatinine between 1.5 and 2.0.  1. AoCKD3: By chart review she has been labeled as diuretic sensitive, but this certainly could be true.  I also wonder if she is having some urinary retention.  No imaging or post void residual monitoring during this hospitalization.  Discussed with nursing and they will check a PVR and let me know.  Potentially will need bladder catheterization. 2. CHF, imaging suggest residual problems and still has some oxygen requirement though at her baseline.  By weights seems similar to previous admission.  Would n hold on additional dosing of lasix pending labs tomorrow morning. 3. Anemia, per TRH 4. DM2 5. Gout 6. Hypertension  Plan 1. As above   Rexene Agent  [phone] 02/15/2019, 4:30 PM

## 2019-02-16 ENCOUNTER — Inpatient Hospital Stay (HOSPITAL_COMMUNITY): Payer: Medicare Other

## 2019-02-16 DIAGNOSIS — J9601 Acute respiratory failure with hypoxia: Secondary | ICD-10-CM

## 2019-02-16 DIAGNOSIS — I25118 Atherosclerotic heart disease of native coronary artery with other forms of angina pectoris: Secondary | ICD-10-CM

## 2019-02-16 LAB — CBC WITH DIFFERENTIAL/PLATELET
Abs Immature Granulocytes: 0.01 10*3/uL (ref 0.00–0.07)
Basophils Absolute: 0 10*3/uL (ref 0.0–0.1)
Basophils Relative: 1 %
Eosinophils Absolute: 0.2 10*3/uL (ref 0.0–0.5)
Eosinophils Relative: 5 %
HCT: 24.3 % — ABNORMAL LOW (ref 36.0–46.0)
Hemoglobin: 7.4 g/dL — ABNORMAL LOW (ref 12.0–15.0)
Immature Granulocytes: 0 %
Lymphocytes Relative: 18 %
Lymphs Abs: 0.6 10*3/uL — ABNORMAL LOW (ref 0.7–4.0)
MCH: 27.1 pg (ref 26.0–34.0)
MCHC: 30.5 g/dL (ref 30.0–36.0)
MCV: 89 fL (ref 80.0–100.0)
Monocytes Absolute: 0.4 10*3/uL (ref 0.1–1.0)
Monocytes Relative: 11 %
Neutro Abs: 2.2 10*3/uL (ref 1.7–7.7)
Neutrophils Relative %: 65 %
Platelets: 214 10*3/uL (ref 150–400)
RBC: 2.73 MIL/uL — ABNORMAL LOW (ref 3.87–5.11)
RDW: 15 % (ref 11.5–15.5)
WBC: 3.3 10*3/uL — ABNORMAL LOW (ref 4.0–10.5)
nRBC: 0 % (ref 0.0–0.2)

## 2019-02-16 LAB — BASIC METABOLIC PANEL
Anion gap: 10 (ref 5–15)
BUN: 44 mg/dL — ABNORMAL HIGH (ref 8–23)
CO2: 31 mmol/L (ref 22–32)
Calcium: 9.2 mg/dL (ref 8.9–10.3)
Chloride: 96 mmol/L — ABNORMAL LOW (ref 98–111)
Creatinine, Ser: 2.4 mg/dL — ABNORMAL HIGH (ref 0.44–1.00)
GFR calc Af Amer: 24 mL/min — ABNORMAL LOW (ref >60)
GFR calc non Af Amer: 21 mL/min — ABNORMAL LOW (ref >60)
Glucose, Bld: 83 mg/dL (ref 70–99)
Potassium: 4.4 mmol/L (ref 3.5–5.1)
Sodium: 137 mmol/L (ref 135–145)

## 2019-02-16 LAB — GLUCOSE, CAPILLARY: Glucose-Capillary: 192 mg/dL — ABNORMAL HIGH (ref 70–99)

## 2019-02-16 NOTE — Progress Notes (Signed)
PROGRESS NOTE  Kara [place]on XNT:[id number] DOB: Jun 18, 1955 DOA: 02/08/2019 PCP: [name], MD  HPI/Recap of past 24 hours: Kara [place]on is a 63 y.o. female with history of nonischemic cardiomyopathy last EF measured was 45% last month diabetes mellitus type 2, chronic kidney disease stage III, hypertension just discharged home yesterday after being admitted for acute CHF and patient also was recently admitted in April 2020 for Covid pneumonia subsequent which patient also was admitted for community-acquired pneumonia and August 2020 presents to the ER with complaint of sudden onset of worsening shortness of breath since yesterday afternoon on the day after discharge.  Patient also started developing retrosternal chest pressure with no associated fever chills nausea vomiting productive cough.  EMS was called and patient was brought to the ER. In the ER patient was requiring 100% nonrebreather and eventually was placed on BiPAP. Chest x-ray is consistent with CHF.  EKG shows sinus rhythm with PVCs with nonspecific T wave changes.  Initial troponin was around 25 and repeat was 660.  BNP was 380.  Patient was given Lasix total of 80 mg IV.  Cardiology on-call was consulted requested starting heparin and nitroglycerin infusion.  Patient admitted for acute CHF and possible non-STEMI.  COVID-19 test negative.     Patient reported feeling slightly better, although still short of breath not back to her baseline.  Noted to have tremors around her mouth and upper extremity, stated it is chronic, but gradually worsening.  Denies any chest pain, nausea/vomiting, fever/chills.   Assessment/Plan: Principal Problem:   Acute respiratory failure with hypoxia (HCC) Active Problems:   Controlled type 2 diabetes mellitus with hyperglycemia (HCC)   Acute on chronic combined systolic and diastolic CHF (congestive heart failure) (HCC)   CKD (chronic kidney disease), stage III   NSTEMI (non-ST elevated  myocardial infarction) ([place])  Acute on chronic hypoxic respiratory failure likely secondary to acute on chronic combined diastolic and systolic CHF.   At baseline she is on 4 L of oxygen by nasal cannula continuously Currently on 4 L with O2 saturation well above 95% Chest x-ray done on admission which showed cardiomegaly with increase in pulmonary vascularity right greater than left suggestive of pulmonary edema, repeat chest x-ray showed improving congestion, no new abnormalities Continue to maintain O2 saturation greater than 92%  Acute on chronic combined systolic and diastolic CHF BNP 967 with 02/10/2019 Last 2D echo with LVEF 40-45% on 01/24/2019. Repeated TTE done on 02/10/2019 showed LVEF 30 to 35% with grade 3 diastolic dysfunction. Continue strict I's and O's and daily weight [facility] on board, continue Coreg, BiDil, deferred diuretic dosing to nephrology  AKI on CKD 3 No significant improvement Baseline creatinine appears to be 1.3 with GFR 46 Nephrology consulted, recommend holding diuretics for now.  Recommend post void residual monitoring Losartan discontinued per cardiology Daily BMP  Cough likely secondary to pulmonary edema in the setting of acute on chronic systolic CHF Continue Tessalon Perles increase dose to 200 mg 3 times daily  Elevated troponin Troponin is peaked at greater than 3100 and trending down  RHC/LHC 02/11/2019 showed nonobstructive coronary artery disease Cardiology on board  DM 2 with hypoglycemia A1c 7.8 Held Lantus Continue sensitive insulin sliding scale, hypoglycemic protocol, Accu-Cheks  History of gout Continue allopurinol  Hyperlipidemia Continue Lipitor  Chronic normocytic anemia likely from CKD Hemoglobin stable No signs of bleeding  History of COVID-19 pneumonia Currently afebrile Repeat Covid test on this admission negative Tested positive on 09/24/2018  DVT prophylaxis:  Subcu Lovenox daily Code  Status: Full code. Family Communication:  None at bedside Disposition Plan:  To be determined, likely home Consults called: Cardiology, nephrology     Objective: Vitals:   02/15/19 1727 02/15/19 2017 02/16/19 0530 02/16/19 1520  BP: (!) 154/62 (!) 138/59 (!) 121/48 139/61  Pulse: 68 67 69   Resp: 17 18 17 18   Temp: 97.8 F (36.6 C) 97.9 F (36.6 C) 97.7 F (36.5 C) 98.2 F (36.8 C)  TempSrc: Oral Oral Oral Oral  SpO2: 100% 100% 96% 96%  Weight:   72.5 kg   Height:        Intake/Output Summary (Last 24 hours) at 02/16/2019 1550 Last data filed at 02/16/2019 1500 Gross per 24 hour  Intake 360 ml  Output 750 ml  Net -390 ml   Filed Weights   02/14/19 0629 02/15/19 0450 02/16/19 0530  Weight: 72.7 kg 73.4 kg 72.5 kg    Exam:  General: NAD, chronically ill-appearing, chronic tremors noted around her mouth/bilateral hands  Cardiovascular: S1, S2 present  Respiratory:  Bibasilar crackles noted  Abdomen: Soft, nontender, nondistended, bowel sounds present  Musculoskeletal: No bilateral pedal edema noted  Skin: Normal  Psychiatry: Normal mood   Data Reviewed: CBC: Recent Labs  Lab 02/10/19 0226 02/11/19 0359  02/11/19 1145 02/12/19 0315 02/14/19 0333 02/15/19 0333 02/16/19 0347  WBC 5.3 4.9  --   --  4.3 3.7* 4.2 3.3*  NEUTROABS 3.8  --   --   --   --  2.6 3.0 2.2  HGB 8.3* 8.2*   < > 7.5* 8.2* 7.6* 7.5* 7.4*  HCT 27.0* 26.7*   < > 22.0* 26.7* 25.3* 24.9* 24.3*  MCV 89.7 89.0  --   --  88.7 90.7 90.5 89.0  PLT 234 214  --   --  223 215 207 214   < > = values in this interval not displayed.   Basic Metabolic Panel: Recent Labs  Lab 02/10/19 0226  02/12/19 0315 02/13/19 0247 02/14/19 0333 02/15/19 0333 02/16/19 0347  NA 139   < > 137 138 139 137 137  K 3.7   < > 4.7 4.3 4.7 4.8 4.4  CL 102   < > 100 99 99 97* 96*  CO2 29   < > 26 29 32 29 31  GLUCOSE 94   < > 110* 98 50* 121* 83  BUN 35*   < > 38* 39* 42* 45* 44*  CREATININE 1.59*   < >  1.64* 1.89* 2.28* 2.37* 2.40*  CALCIUM 9.1   < > 9.2 9.2 9.2 8.9 9.2  MG 1.9  --   --   --   --   --   --   PHOS 3.1  --   --   --   --   --   --    < > = values in this interval not displayed.   GFR: Estimated Creatinine Clearance: 23.6 mL/min (A) (by C-G formula based on SCr of 2.4 mg/dL (H)). Liver Function Tests: Recent Labs  Lab 02/10/19 0226  AST 17  ALT 10  ALKPHOS 52  BILITOT 0.2*  PROT 4.9*  ALBUMIN 2.1*   No results for input(s): LIPASE, AMYLASE in the last 168 hours. No results for input(s): AMMONIA in the last 168 hours. Coagulation Profile: No results for input(s): INR, PROTIME in the last 168 hours. Cardiac Enzymes: No results for input(s): CKTOTAL, CKMB, CKMBINDEX, TROPONINI in the last 168  hours. BNP (last 3 results) No results for input(s): PROBNP in the last 8760 hours. HbA1C: No results for input(s): HGBA1C in the last 72 hours. CBG: Recent Labs  Lab 02/15/19 0451 02/15/19 0816 02/15/19 1115 02/15/19 1650 02/15/19 2341  GLUCAP 104* 91 149* 101* 105*   Lipid Profile: No results for input(s): CHOL, HDL, LDLCALC, TRIG, CHOLHDL, LDLDIRECT in the last 72 hours. Thyroid Function Tests: No results for input(s): TSH, T4TOTAL, FREET4, T3FREE, THYROIDAB in the last 72 hours. Anemia Panel: No results for input(s): VITAMINB12, FOLATE, FERRITIN, TIBC, IRON, RETICCTPCT in the last 72 hours. Urine analysis:    Component Value Date/Time   COLORURINE YELLOW 01/28/2019 0714   APPEARANCEUR TURBID (A) 01/28/2019 0714   LABSPEC 1.015 01/28/2019 0714   PHURINE 5.0 01/28/2019 0714   GLUCOSEU 50 (A) 01/28/2019 0714   HGBUR NEGATIVE 01/28/2019 0714   BILIRUBINUR NEGATIVE 01/28/2019 0714   BILIRUBINUR negative 12/13/2017 [place] 01/28/2019 0714   PROTEINUR >=300 (A) 01/28/2019 0714   UROBILINOGEN 0.2 12/13/2017 1514   NITRITE NEGATIVE 01/28/2019 0714   LEUKOCYTESUR MODERATE (A) 01/28/2019 0714   Sepsis Labs:  @[email or web address] (procalcitonin:4,lacticidven:4)  ) Recent Results (from the past 240 hour(s))  SARS CORONAVIRUS 2 (TAT 6-24 HRS) Nasopharyngeal Nasopharyngeal Swab     Status: None   Collection Time: 02/09/19  3:00 AM   Specimen: Nasopharyngeal Swab  Result Value Ref Range Status   SARS Coronavirus 2 NEGATIVE NEGATIVE Final    Comment: (NOTE) SARS-CoV-2 target nucleic acids are NOT DETECTED. The SARS-CoV-2 RNA is generally detectable in upper and lower respiratory specimens during the acute phase of infection. Negative results do not preclude SARS-CoV-2 infection, do not rule out co-infections with other pathogens, and should not be used as the sole basis for treatment or other patient management decisions. Negative results must be combined with clinical observations, patient history, and epidemiological information. The expected result is Negative. Fact Sheet for Patients: SugarRoll.be Fact Sheet for Healthcare Providers: https://www.woods-mathews.com/ This test is not yet approved or cleared by the [place] FDA and  has been authorized for detection and/or diagnosis of SARS-CoV-2 by FDA under an Emergency Use Authorization (EUA). This EUA will remain  in effect (meaning this test can be used) for the duration of the COVID-19 declaration under Section 56 4(b)(1) of the Act, 21 U.S.C. section 360bbb-3(b)(1), unless the authorization is terminated or revoked sooner. Performed at [facility], [place] [address], [place], [place] [address]       Studies: US Renal  Result Date: 02/16/2019 CLINICAL DATA:  Initial evaluation for acute renal injury. EXAM: RENAL / URINARY TRACT ULTRASOUND COMPLETE COMPARISON:  Prior ultrasound from 01/28/2019. FINDINGS: Right Kidney: Renal measurements: 10.3 x 5.0 x 5.6 cm = volume: 151.0 mL. Echogenicity within normal limits. No mass or hydronephrosis visualized. Left Kidney: Renal measurements: 10.7 x 5.2  x 5.4 cm = volume: 157.6 mL. Echogenicity within normal limits. No mass or hydronephrosis visualized. Bladder: Appears normal for degree of bladder distention. Other: None. IMPRESSION: Normal renal ultrasound.  No hydronephrosis or other acute finding. Electronically Signed   By: Jeannine Boga M.D.   On: 02/16/2019 13:48    Scheduled Meds: . allopurinol  150 mg Oral Daily  . aspirin EC  81 mg Oral Daily  . atorvastatin  80 mg Oral q1800  . carvedilol  25 mg Oral BID WC  . DULoxetine  30 mg Oral Daily  . enoxaparin (LOVENOX) injection  30 mg Subcutaneous Q24H  . gabapentin  100 mg Oral BID  . insulin aspart  0-9 Units Subcutaneous Q4H  . isosorbide-hydrALAZINE  1 tablet Oral TID  . pantoprazole  20 mg Oral Daily  . sodium chloride flush  3 mL Intravenous Q12H  . sodium chloride flush  3 mL Intravenous Q12H    Continuous Infusions: . sodium chloride       LOS: 7 days     Alma Friendly, MD [facility]  If 7PM-7AM, please contact night-coverage [email or web address] 02/16/2019, 3:50 PM

## 2019-02-16 NOTE — Progress Notes (Signed)
Admit: 02/08/2019 LOS: 7  60F AoCKD3 likely related to diuresis for CHF  Subjective:  . Stable SCr . No sig LEE, weights slightly improved . On RA, breathing ok . Bladder scan yesterday 0.5L, not sure if this was post void??  11/13 0701 - 11/14 0700 In: 360 [P.O.:360] Out: 150 [Urine:150]  Filed Weights   02/14/19 0629 02/15/19 0450 02/16/19 0530  Weight: 72.7 kg 73.4 kg 72.5 kg    Scheduled Meds: . allopurinol  150 mg Oral Daily  . aspirin EC  81 mg Oral Daily  . atorvastatin  80 mg Oral q1800  . carvedilol  25 mg Oral BID WC  . DULoxetine  30 mg Oral Daily  . enoxaparin (LOVENOX) injection  30 mg Subcutaneous Q24H  . gabapentin  100 mg Oral BID  . insulin aspart  0-9 Units Subcutaneous Q4H  . isosorbide-hydrALAZINE  1 tablet Oral TID  . pantoprazole  20 mg Oral Daily  . sodium chloride flush  3 mL Intravenous Q12H  . sodium chloride flush  3 mL Intravenous Q12H   Continuous Infusions: . sodium chloride     PRN Meds:.sodium chloride, acetaminophen **OR** acetaminophen, bisacodyl, dextrose, ondansetron **OR** ondansetron (ZOFRAN) IV, polyethylene glycol, sodium chloride flush  Current Labs: reviewed    Physical Exam:  Blood pressure (!) 121/48, pulse 69, temperature 97.7 F (36.5 C), temperature source Oral, resp. rate 17, height 5\' 5"  (1.651 m), weight 72.5 kg, SpO2 96 %. GEN: Chronically ill-appearing, NAD, sitting in chair, just finished working with PT ENT: NCAT EYES: EOMI CV: Regular, normal S1 and S2, no rub PULM: Diminished in the bases, clear throughout otherwise ABD: Soft, nontender SKIN: No rashes or lesions EXT: Trace edema NEURO: Nonfocal, CN II through XII intact   A 1. AoCKD3: By chart review she has been labeled as diuretic sensitive, and this certainly could be true.  I still wonder if she is having some urinary retention similar to last admission, bladder scan was 0.5L but not sure if post void.  Will do formal renal US today.  Cont to hold  diuretics. Clinically stable for #2 2. CHF, imaging suggest residual problems and still has some oxygen requirement though at her baseline.  By weights seems similar to previous admission.  Would n hold on additional dosing of lasix for now 3. Anemia, per TRH 4. DM2 5. Gout 6. Hypertension  P . As above . Daily weights, Daily Renal Panel, Strict I/Os, Avoid nephrotoxins (NSAIDs, judicious IV Contrast)    Pearson Grippe MD 02/16/2019, 10:38 AM  Recent Labs  Lab 02/10/19 0226  02/14/19 0333 02/15/19 0333 02/16/19 0347  NA 139   < > 139 137 137  K 3.7   < > 4.7 4.8 4.4  CL 102   < > 99 97* 96*  CO2 29   < > 32 29 31  GLUCOSE 94   < > 50* 121* 83  BUN 35*   < > 42* 45* 44*  CREATININE 1.59*   < > 2.28* 2.37* 2.40*  CALCIUM 9.1   < > 9.2 8.9 9.2  PHOS 3.1  --   --   --   --    < > = values in this interval not displayed.   Recent Labs  Lab 02/14/19 0333 02/15/19 0333 02/16/19 0347  WBC 3.7* 4.2 3.3*  NEUTROABS 2.6 3.0 2.2  HGB 7.6* 7.5* 7.4*  HCT 25.3* 24.9* 24.3*  MCV 90.7 90.5 89.0  PLT 215 207 214

## 2019-02-16 NOTE — Progress Notes (Signed)
Progress Note  Patient Name: Kara Hanson Date of Encounter: 02/16/2019  Primary Cardiologist: Candee Furbish, MD   Subjective   Doing well this morning.  Denies chest pain.  Says breathing is better.  Inpatient Medications    Scheduled Meds: . allopurinol  150 mg Oral Daily  . aspirin EC  81 mg Oral Daily  . atorvastatin  80 mg Oral q1800  . carvedilol  25 mg Oral BID WC  . DULoxetine  30 mg Oral Daily  . enoxaparin (LOVENOX) injection  30 mg Subcutaneous Q24H  . gabapentin  100 mg Oral BID  . insulin aspart  0-9 Units Subcutaneous Q4H  . isosorbide-hydrALAZINE  1 tablet Oral TID  . pantoprazole  20 mg Oral Daily  . sodium chloride flush  3 mL Intravenous Q12H  . sodium chloride flush  3 mL Intravenous Q12H   Continuous Infusions: . sodium chloride     PRN Meds: sodium chloride, acetaminophen **OR** acetaminophen, bisacodyl, dextrose, ondansetron **OR** ondansetron (ZOFRAN) IV, polyethylene glycol, sodium chloride flush   Vital Signs    Vitals:   02/15/19 0450 02/15/19 1727 02/15/19 2017 02/16/19 0530  BP: (!) 130/59 (!) 154/62 (!) 138/59 (!) 121/48  Pulse: 76 68 67 69  Resp:  17 18 17   Temp: 98.9 F (37.2 C) 97.8 F (36.6 C) 97.9 F (36.6 C) 97.7 F (36.5 C)  TempSrc: Oral Oral Oral Oral  SpO2:  100% 100% 96%  Weight: 73.4 kg   72.5 kg  Height:        Intake/Output Summary (Last 24 hours) at 02/16/2019 0900 Last data filed at 02/15/2019 2229 Gross per 24 hour  Intake 360 ml  Output 150 ml  Net 210 ml   Filed Weights   02/14/19 0629 02/15/19 0450 02/16/19 0530  Weight: 72.7 kg 73.4 kg 72.5 kg    Telemetry    NSR with PVC's - Personally Reviewed   Physical Exam   GEN: No acute distress.   Neck: No JVD Cardiac: RRR, no murmurs, rubs, or gallops.  Respiratory: Clear to auscultation bilaterally. GI: Soft, nontender, non-distended  MS: No edema; No deformity. Neuro:  Nonfocal  Psych: Normal affect   Labs    Chemistry Recent Labs  Lab  02/10/19 0226  02/14/19 0333 02/15/19 0333 02/16/19 0347  NA 139   < > 139 137 137  K 3.7   < > 4.7 4.8 4.4  CL 102   < > 99 97* 96*  CO2 29   < > 32 29 31  GLUCOSE 94   < > 50* 121* 83  BUN 35*   < > 42* 45* 44*  CREATININE 1.59*   < > 2.28* 2.37* 2.40*  CALCIUM 9.1   < > 9.2 8.9 9.2  PROT 4.9*  --   --   --   --   ALBUMIN 2.1*  --   --   --   --   AST 17  --   --   --   --   ALT 10  --   --   --   --   ALKPHOS 52  --   --   --   --   BILITOT 0.2*  --   --   --   --   GFRNONAA 34*   < > 22* 21* 21*  GFRAA 39*   < > 25* 24* 24*  ANIONGAP 8   < > 8 11 10    < > = values  in this interval not displayed.     Hematology Recent Labs  Lab 02/14/19 0333 02/15/19 0333 02/16/19 0347  WBC 3.7* 4.2 3.3*  RBC 2.79* 2.75* 2.73*  HGB 7.6* 7.5* 7.4*  HCT 25.3* 24.9* 24.3*  MCV 90.7 90.5 89.0  MCH 27.2 27.3 27.1  MCHC 30.0 30.1 30.5  RDW 14.9 15.1 15.0  PLT 215 207 214    Cardiac EnzymesNo results for input(s): TROPONINI in the last 168 hours. No results for input(s): TROPIPOC in the last 168 hours.   BNP Recent Labs  Lab 02/10/19 0700  BNP 379.3*     DDimer No results for input(s): DDIMER in the last 168 hours.   Radiology    No results found.   Patient Profile     63 y.o. female with past medical history of stress-induced cardiomyopathy, diabetes mellitus, hypertension, chronic renal insufficiency admitted with recurrent congestive heart failure and elevated troponin.  Echocardiogram shows ejection fraction 40 to 45%, moderate left ventricular hypertrophy, apical hypokinesis, mild mitral regurgitation.  Follow-up echocardiogram this admission shows ejection fraction 30 to 35%, mild aortic stenosis (peak velocity of 2 m/s in setting of reduced LV function, no gradient reported), mild mitral regurgitation and moderate tricuspid regurgitation. Cardiac catheterization on November 9 showed nonobstructive coronary disease and pulmonary capillary wedge pressure 26.  Assessment  & Plan    1 acute combined systolic/diastolic congestive heart failure-pulmonary capillary wedge pressure 26 at time of catheterization.  I/O+210.  Patient symptoms have improved. Lasix on hold as her renal function has deteriorated with diuresis similar to previous.  Nephrology following and to dose diuretics.    2 cardiomyopathy-etiology unclear.  Cardiac catheterization showed nonobstructive coronary disease.  Will continue beta-blocker, hydralazine and nitrates. Will not add ARB or Entresto at this point given renal insufficiency.  Titrate medications and repeat echocardiogram in 3 months.  If LV function remains reduced with ejection fraction less than 35% would need to consider ICD.  3 chronic stage III kidney disease-renal function worse today.  Diuretics on hold. Nephrology following and to dose diuretics.    4 question aortic stenosis-not well evaluated on recent echocardiogram.  S2 is not diminished on examination.  We will plan follow-up echoes in the future.  5 normocytic anemia-possibly secondary to renal insufficiency.  Per primary care.  6 CAD-continue ASA and statin.     For questions or updates, please contact [place] Please consult [email or web address] for contact info under Cardiology/STEMI.      Signed, Kate Sable, MD  02/16/2019, 9:00 AM

## 2019-02-16 NOTE — Plan of Care (Signed)
  Problem: Education: Goal: Knowledge of General Education information will improve Description: Including pain rating scale, medication(s)/side effects and non-pharmacologic comfort measures Outcome: Progressing   Problem: Health Behavior/Discharge Planning: Goal: Ability to manage health-related needs will improve Outcome: Progressing   Problem: Clinical Measurements: Goal: Ability to maintain clinical measurements within normal limits will improve Outcome: Progressing Goal: Will remain free from infection Outcome: Progressing Goal: Diagnostic test results will improve Outcome: Progressing   Problem: Activity: Goal: Risk for activity intolerance will decrease Outcome: Progressing   Problem: Coping: Goal: Level of anxiety will decrease Outcome: Progressing   Problem: Pain Managment: Goal: General experience of comfort will improve Outcome: Progressing  Elesa Hacker, RN

## 2019-02-17 LAB — BASIC METABOLIC PANEL
Anion gap: 10 (ref 5–15)
BUN: 43 mg/dL — ABNORMAL HIGH (ref 8–23)
CO2: 29 mmol/L (ref 22–32)
Calcium: 9.2 mg/dL (ref 8.9–10.3)
Chloride: 99 mmol/L (ref 98–111)
Creatinine, Ser: 2.32 mg/dL — ABNORMAL HIGH (ref 0.44–1.00)
GFR calc Af Amer: 25 mL/min — ABNORMAL LOW (ref >60)
GFR calc non Af Amer: 22 mL/min — ABNORMAL LOW (ref >60)
Glucose, Bld: 95 mg/dL (ref 70–99)
Potassium: 4.7 mmol/L (ref 3.5–5.1)
Sodium: 138 mmol/L (ref 135–145)

## 2019-02-17 LAB — CBC WITH DIFFERENTIAL/PLATELET
Abs Immature Granulocytes: 0.01 10*3/uL (ref 0.00–0.07)
Basophils Absolute: 0 10*3/uL (ref 0.0–0.1)
Basophils Relative: 1 %
Eosinophils Absolute: 0.1 10*3/uL (ref 0.0–0.5)
Eosinophils Relative: 3 %
HCT: 25.2 % — ABNORMAL LOW (ref 36.0–46.0)
Hemoglobin: 7.7 g/dL — ABNORMAL LOW (ref 12.0–15.0)
Immature Granulocytes: 0 %
Lymphocytes Relative: 18 %
Lymphs Abs: 0.7 10*3/uL (ref 0.7–4.0)
MCH: 27.2 pg (ref 26.0–34.0)
MCHC: 30.6 g/dL (ref 30.0–36.0)
MCV: 89 fL (ref 80.0–100.0)
Monocytes Absolute: 0.4 10*3/uL (ref 0.1–1.0)
Monocytes Relative: 10 %
Neutro Abs: 2.6 10*3/uL (ref 1.7–7.7)
Neutrophils Relative %: 68 %
Platelets: 235 10*3/uL (ref 150–400)
RBC: 2.83 MIL/uL — ABNORMAL LOW (ref 3.87–5.11)
RDW: 14.7 % (ref 11.5–15.5)
WBC: 3.8 10*3/uL — ABNORMAL LOW (ref 4.0–10.5)
nRBC: 0 % (ref 0.0–0.2)

## 2019-02-17 LAB — GLUCOSE, CAPILLARY
Glucose-Capillary: 90 mg/dL (ref 70–99)
Glucose-Capillary: 110 mg/dL — ABNORMAL HIGH (ref 70–99)

## 2019-02-17 MED ORDER — FUROSEMIDE 80 MG PO TABS
80.0000 mg | ORAL_TABLET | Freq: Two times a day (BID) | ORAL | Status: DC
  Administered 2019-02-17 – 2019-02-19 (×5): 80 mg via ORAL
  Filled 2019-02-17 (×5): qty 1

## 2019-02-17 MED ORDER — GLUCERNA SHAKE PO LIQD
237.0000 mL | Freq: Three times a day (TID) | ORAL | Status: DC
  Administered 2019-02-17 – 2019-02-19 (×5): 237 mL via ORAL

## 2019-02-17 NOTE — Progress Notes (Signed)
Admit: 02/08/2019 LOS: 8  67F AoCKD3 likely related to diuresis for CHF  Subjective:  . Stable SCr . Some hypoxia on 2L this AM . No sig LEE, weights slightly improved . Renal US w/o obstruction or acute structural issues . K 4.7  11/14 0701 - 11/15 0700 In: -  Out: 600 [Urine:600]  Filed Weights   02/15/19 0450 02/16/19 0530 02/17/19 0500  Weight: 73.4 kg 72.5 kg 72.7 kg    Scheduled Meds: . allopurinol  150 mg Oral Daily  . aspirin EC  81 mg Oral Daily  . atorvastatin  80 mg Oral q1800  . carvedilol  25 mg Oral BID WC  . DULoxetine  30 mg Oral Daily  . enoxaparin (LOVENOX) injection  30 mg Subcutaneous Q24H  . feeding supplement (GLUCERNA SHAKE)  237 mL Oral TID BM  . furosemide  80 mg Oral BID  . gabapentin  100 mg Oral BID  . insulin aspart  0-9 Units Subcutaneous Q4H  . isosorbide-hydrALAZINE  1 tablet Oral TID  . pantoprazole  20 mg Oral Daily  . sodium chloride flush  3 mL Intravenous Q12H   Continuous Infusions: . sodium chloride     PRN Meds:.sodium chloride, acetaminophen **OR** acetaminophen, bisacodyl, dextrose, ondansetron **OR** ondansetron (ZOFRAN) IV, polyethylene glycol, sodium chloride flush  Current Labs: reviewed    Physical Exam:  Blood pressure 134/60, pulse 75, temperature 97.9 F (36.6 C), temperature source Oral, resp. rate 14, height 5\' 5"  (1.651 m), weight 72.7 kg, SpO2 95 %. GEN: Chronically ill-appearing, NAD, sitting in chair, just finished working with PT ENT: NCAT EYES: EOMI CV: Regular, normal S1 and S2, no rub PULM: Diminished in the bases, clear throughout otherwise ABD: Soft, nontender SKIN: No rashes or lesions EXT: Trace edema NEURO: Nonfocal, CN II through XII intact   A 1. AoCKD3: By chart review she has been labeled as diuretic sensitive, and this certainly could be true.  Renal US w/o obstruction.  Stable GFR.  Resume PO diuretics today given some hypoxia.   2. CHF, imaging suggest residual problems and still has  some oxygen requirement though at her baseline.  By weights seems similar to previous admission.  Resuming oral regimen today 3. Anemia, per TRH 4. DM2 5. Gout 6. Hypertension  P . As above . Daily weights, Daily Renal Panel, Strict I/Os, Avoid nephrotoxins (NSAIDs, judicious IV Contrast)    Pearson Grippe MD 02/17/2019, 11:22 AM  Recent Labs  Lab 02/15/19 0333 02/16/19 0347 02/17/19 0324  NA 137 137 138  K 4.8 4.4 4.7  CL 97* 96* 99  CO2 29 31 29   GLUCOSE 121* 83 95  BUN 45* 44* 43*  CREATININE 2.37* 2.40* 2.32*  CALCIUM 8.9 9.2 9.2   Recent Labs  Lab 02/15/19 0333 02/16/19 0347 02/17/19 0324  WBC 4.2 3.3* 3.8*  NEUTROABS 3.0 2.2 2.6  HGB 7.5* 7.4* 7.7*  HCT 24.9* 24.3* 25.2*  MCV 90.5 89.0 89.0  PLT 207 214 235

## 2019-02-17 NOTE — Progress Notes (Signed)
PROGRESS NOTE  Kara Hanson KCL:[id number] DOB: 14-Jul-1955 DOA: 02/08/2019 PCP: [name], MD  HPI/Recap of past 24 hours: Kara Hanson is a 63 y.o. female with history of nonischemic cardiomyopathy last EF measured was 45% last month diabetes mellitus type 2, chronic kidney disease stage III, hypertension just discharged home yesterday after being admitted for acute CHF and patient also was recently admitted in April 2020 for Covid pneumonia subsequent which patient also was admitted for community-acquired pneumonia and August 2020 presents to the ER with complaint of sudden onset of worsening shortness of breath since yesterday afternoon on the day after discharge.  Patient also started developing retrosternal chest pressure with no associated fever chills nausea vomiting productive cough.  EMS was called and patient was brought to the ER. In the ER patient was requiring 100% nonrebreather and eventually was placed on BiPAP. Chest x-ray is consistent with CHF.  EKG shows sinus rhythm with PVCs with nonspecific T wave changes.  Initial troponin was around 25 and repeat was 660.  BNP was 380.  Patient was given Lasix total of 80 mg IV.  Cardiology on-call was consulted requested starting heparin and nitroglycerin infusion.  Patient admitted for acute CHF and possible non-STEMI.  COVID-19 test negative.     Patient still with some shortness of breath, denies any chest pain, abdominal pain, nausea/vomiting, fever/chills.   Assessment/Plan: Principal Problem:   Acute respiratory failure with hypoxia (HCC) Active Problems:   Controlled type 2 diabetes mellitus with hyperglycemia (HCC)   Acute on chronic combined systolic and diastolic CHF (congestive heart failure) (HCC)   CKD (chronic kidney disease), stage III   NSTEMI (non-ST elevated myocardial infarction) ([place])  Acute on chronic hypoxic respiratory failure likely secondary to acute on chronic combined diastolic and systolic CHF.    At baseline she is on 4 L of oxygen by nasal cannula continuously Currently on 4 L with O2 saturation well above 95% Chest x-ray done on admission which showed cardiomegaly with increase in pulmonary vascularity right greater than left suggestive of pulmonary edema, repeat chest x-ray showed improving congestion, no new abnormalities Continue to maintain O2 saturation greater than 92%  Acute on chronic combined systolic and diastolic CHF BNP 494 with 02/10/2019 Last 2D echo with LVEF 40-45% on 01/24/2019. Repeated TTE done on 02/10/2019 showed LVEF 30 to 35% with grade 3 diastolic dysfunction. Continue strict I's and O's and daily weight Net INO -[place] [facility] on board, continue Coreg, BiDil, deferred diuretic dosing to nephrology  AKI on CKD 3 Slight improvement Baseline creatinine appears to be 1.3 with GFR 46 Nephrology consulted, recommend switching to p.o. Lasix for now. Recommend post void residual monitoring Losartan discontinued per cardiology Daily BMP  Cough likely secondary to pulmonary edema in the setting of acute on chronic systolic CHF Continue Tessalon Perles increase dose to 200 mg 3 times daily  Elevated troponin Troponin is peaked at greater than 3100 and trending down  RHC/LHC 02/11/2019 showed nonobstructive coronary artery disease Cardiology on board  DM 2 with hypoglycemia A1c 7.8 Held Lantus Continue sensitive insulin sliding scale, hypoglycemic protocol, Accu-Cheks  History of gout Continue allopurinol  Hyperlipidemia Continue Lipitor  Chronic normocytic anemia likely from CKD Hemoglobin stable No signs of bleeding  History of COVID-19 pneumonia Currently afebrile Repeat Covid test on this admission negative Tested positive on 09/24/2018    DVT prophylaxis:  Subcu Lovenox daily Code Status: Full code. Family Communication:  None at bedside Disposition Plan:  To be determined, likely home  Consults called: Cardiology, nephrology      Objective: Vitals:   02/17/19 0500 02/17/19 1045 02/17/19 1400 02/17/19 1600  BP: 134/60 (!) 143/59 138/61 (!) 141/52  Pulse: 75  78   Resp: 14  16   Temp: 97.9 F (36.6 C)  98.1 F (36.7 C)   TempSrc: Oral  Oral   SpO2: 95% 100%  99%  Weight: 72.7 kg     Height:       No intake or output data in the 24 hours ending 02/17/19 1805 Filed Weights   02/15/19 0450 02/16/19 0530 02/17/19 0500  Weight: 73.4 kg 72.5 kg 72.7 kg    Exam:  General: NAD, chronically ill-appearing, chronic tremors noted around her mouth/bilateral hands  Cardiovascular: S1, S2 present  Respiratory:  Bibasilar crackles noted  Abdomen: Soft, nontender, nondistended, bowel sounds present  Musculoskeletal: No bilateral pedal edema noted  Skin: Normal  Psychiatry: Normal mood   Data Reviewed: CBC: Recent Labs  Lab 02/12/19 0315 02/14/19 0333 02/15/19 0333 02/16/19 0347 02/17/19 0324  WBC 4.3 3.7* 4.2 3.3* 3.8*  NEUTROABS  --  2.6 3.0 2.2 2.6  HGB 8.2* 7.6* 7.5* 7.4* 7.7*  HCT 26.7* 25.3* 24.9* 24.3* 25.2*  MCV 88.7 90.7 90.5 89.0 89.0  PLT 223 215 207 214 196   Basic Metabolic Panel: Recent Labs  Lab 02/13/19 0247 02/14/19 0333 02/15/19 0333 02/16/19 0347 02/17/19 0324  NA 138 139 137 137 138  K 4.3 4.7 4.8 4.4 4.7  CL 99 99 97* 96* 99  CO2 29 32 29 31 29   GLUCOSE 98 50* 121* 83 95  BUN 39* 42* 45* 44* 43*  CREATININE 1.89* 2.28* 2.37* 2.40* 2.32*  CALCIUM 9.2 9.2 8.9 9.2 9.2   GFR: Estimated Creatinine Clearance: 24.5 mL/min (A) (by C-G formula based on SCr of 2.32 mg/dL (H)). Liver Function Tests: No results for input(s): AST, ALT, ALKPHOS, BILITOT, PROT, ALBUMIN in the last 168 hours. No results for input(s): LIPASE, AMYLASE in the last 168 hours. No results for input(s): AMMONIA in the last 168 hours. Coagulation Profile: No results for input(s): INR, PROTIME in the last 168 hours. Cardiac Enzymes: No results for input(s): CKTOTAL, CKMB, CKMBINDEX, TROPONINI in the  last 168 hours. BNP (last 3 results) No results for input(s): PROBNP in the last 8760 hours. HbA1C: No results for input(s): HGBA1C in the last 72 hours. CBG: Recent Labs  Lab 02/15/19 1650 02/15/19 2341 02/16/19 2038 02/17/19 0746 02/17/19 1135  GLUCAP 101* 105* 192* 90 110*   Lipid Profile: No results for input(s): CHOL, HDL, LDLCALC, TRIG, CHOLHDL, LDLDIRECT in the last 72 hours. Thyroid Function Tests: No results for input(s): TSH, T4TOTAL, FREET4, T3FREE, THYROIDAB in the last 72 hours. Anemia Panel: No results for input(s): VITAMINB12, FOLATE, FERRITIN, TIBC, IRON, RETICCTPCT in the last 72 hours. Urine analysis:    Component Value Date/Time   COLORURINE YELLOW 01/28/2019 0714   APPEARANCEUR TURBID (A) 01/28/2019 0714   LABSPEC 1.015 01/28/2019 0714   PHURINE 5.0 01/28/2019 0714   GLUCOSEU 50 (A) 01/28/2019 0714   HGBUR NEGATIVE 01/28/2019 0714   BILIRUBINUR NEGATIVE 01/28/2019 0714   BILIRUBINUR negative 12/13/2017 1514   KETONESUR NEGATIVE 01/28/2019 0714   PROTEINUR >=300 (A) 01/28/2019 0714   UROBILINOGEN 0.2 12/13/2017 1514   NITRITE NEGATIVE 01/28/2019 0714   LEUKOCYTESUR MODERATE (A) 01/28/2019 0714   Sepsis Labs: @LABRCNTIP (procalcitonin:4,lacticidven:4)  ) Recent Results (from the past 240 hour(s))  SARS CORONAVIRUS 2 (TAT 6-24 HRS) Nasopharyngeal Nasopharyngeal Swab  Status: None   Collection Time: 02/09/19  3:00 AM   Specimen: Nasopharyngeal Swab  Result Value Ref Range Status   SARS Coronavirus 2 NEGATIVE NEGATIVE Final    Comment: (NOTE) SARS-CoV-2 target nucleic acids are NOT DETECTED. The SARS-CoV-2 RNA is generally detectable in upper and lower respiratory specimens during the acute phase of infection. Negative results do not preclude SARS-CoV-2 infection, do not rule out co-infections with other pathogens, and should not be used as the sole basis for treatment or other patient management decisions. Negative results must be combined with  clinical observations, patient history, and epidemiological information. The expected result is Negative. Fact Sheet for Patients: SugarRoll.be Fact Sheet for Healthcare Providers: https://www.woods-mathews.com/ This test is not yet approved or cleared by the [place] FDA and  has been authorized for detection and/or diagnosis of SARS-CoV-2 by FDA under an Emergency Use Authorization (EUA). This EUA will remain  in effect (meaning this test can be used) for the duration of the COVID-19 declaration under Section 56 4(b)(1) of the Act, 21 U.S.C. section 360bbb-3(b)(1), unless the authorization is terminated or revoked sooner. Performed at [facility], [place] [address] [place], [place] [address]       Studies: No results found.  Scheduled Meds: . allopurinol  150 mg Oral Daily  . aspirin EC  81 mg Oral Daily  . atorvastatin  80 mg Oral q1800  . carvedilol  25 mg Oral BID WC  . DULoxetine  30 mg Oral Daily  . enoxaparin (LOVENOX) injection  30 mg Subcutaneous Q24H  . feeding supplement (GLUCERNA SHAKE)  237 mL Oral TID BM  . furosemide  80 mg Oral BID  . gabapentin  100 mg Oral BID  . insulin aspart  0-9 Units Subcutaneous Q4H  . isosorbide-hydrALAZINE  1 tablet Oral TID  . pantoprazole  20 mg Oral Daily  . sodium chloride flush  3 mL Intravenous Q12H    Continuous Infusions: . sodium chloride       LOS: 8 days     Alma Friendly, MD [facility]  If 7PM-7AM, please contact night-coverage [email or web address] 02/17/2019, 6:05 PM

## 2019-02-18 LAB — GLUCOSE, CAPILLARY
Glucose-Capillary: 117 mg/dL — ABNORMAL HIGH (ref 70–99)
Glucose-Capillary: 120 mg/dL — ABNORMAL HIGH (ref 70–99)
Glucose-Capillary: 123 mg/dL — ABNORMAL HIGH (ref 70–99)
Glucose-Capillary: 128 mg/dL — ABNORMAL HIGH (ref 70–99)
Glucose-Capillary: 128 mg/dL — ABNORMAL HIGH (ref 70–99)
Glucose-Capillary: 137 mg/dL — ABNORMAL HIGH (ref 70–99)
Glucose-Capillary: 141 mg/dL — ABNORMAL HIGH (ref 70–99)
Glucose-Capillary: 149 mg/dL — ABNORMAL HIGH (ref 70–99)
Glucose-Capillary: 152 mg/dL — ABNORMAL HIGH (ref 70–99)

## 2019-02-18 LAB — BASIC METABOLIC PANEL
Anion gap: 9 (ref 5–15)
BUN: 40 mg/dL — ABNORMAL HIGH (ref 8–23)
CO2: 30 mmol/L (ref 22–32)
Calcium: 9.3 mg/dL (ref 8.9–10.3)
Chloride: 98 mmol/L (ref 98–111)
Creatinine, Ser: 2.08 mg/dL — ABNORMAL HIGH (ref 0.44–1.00)
GFR calc Af Amer: 28 mL/min — ABNORMAL LOW (ref >60)
GFR calc non Af Amer: 25 mL/min — ABNORMAL LOW (ref >60)
Glucose, Bld: 129 mg/dL — ABNORMAL HIGH (ref 70–99)
Potassium: 4.9 mmol/L (ref 3.5–5.1)
Sodium: 137 mmol/L (ref 135–145)

## 2019-02-18 LAB — CBC WITH DIFFERENTIAL/PLATELET
Abs Immature Granulocytes: 0 10*3/uL (ref 0.00–0.07)
Basophils Absolute: 0 10*3/uL (ref 0.0–0.1)
Basophils Relative: 0 %
Eosinophils Absolute: 0.1 10*3/uL (ref 0.0–0.5)
Eosinophils Relative: 3 %
HCT: 25.9 % — ABNORMAL LOW (ref 36.0–46.0)
Hemoglobin: 7.9 g/dL — ABNORMAL LOW (ref 12.0–15.0)
Immature Granulocytes: 0 %
Lymphocytes Relative: 16 %
Lymphs Abs: 0.7 10*3/uL (ref 0.7–4.0)
MCH: 27.2 pg (ref 26.0–34.0)
MCHC: 30.5 g/dL (ref 30.0–36.0)
MCV: 89.3 fL (ref 80.0–100.0)
Monocytes Absolute: 0.4 10*3/uL (ref 0.1–1.0)
Monocytes Relative: 9 %
Neutro Abs: 2.9 10*3/uL (ref 1.7–7.7)
Neutrophils Relative %: 72 %
Platelets: 258 10*3/uL (ref 150–400)
RBC: 2.9 MIL/uL — ABNORMAL LOW (ref 3.87–5.11)
RDW: 14.9 % (ref 11.5–15.5)
WBC: 4.1 10*3/uL (ref 4.0–10.5)
nRBC: 0 % (ref 0.0–0.2)

## 2019-02-18 MED ORDER — ALUM & MAG HYDROXIDE-SIMETH 200-200-20 MG/5ML PO SUSP
30.0000 mL | ORAL | Status: DC | PRN
  Administered 2019-02-18 – 2019-02-19 (×2): 30 mL via ORAL
  Filled 2019-02-18 (×2): qty 30

## 2019-02-18 NOTE — Progress Notes (Signed)
Admit: 02/08/2019 LOS: 9  65F AoCKD3 likely related to diuresis for CHF  Subjective:  . Cr getting better on PO diuretics.    11/15 0701 - 11/16 0700 In: 960 [P.O.:960] Out: 850 [Urine:850]  Filed Weights   02/16/19 0530 02/17/19 0500 02/18/19 0639  Weight: 72.5 kg 72.7 kg 72 kg    Scheduled Meds: . allopurinol  150 mg Oral Daily  . aspirin EC  81 mg Oral Daily  . atorvastatin  80 mg Oral q1800  . carvedilol  25 mg Oral BID WC  . DULoxetine  30 mg Oral Daily  . enoxaparin (LOVENOX) injection  30 mg Subcutaneous Q24H  . feeding supplement (GLUCERNA SHAKE)  237 mL Oral TID BM  . furosemide  80 mg Oral BID  . gabapentin  100 mg Oral BID  . insulin aspart  0-9 Units Subcutaneous Q4H  . isosorbide-hydrALAZINE  1 tablet Oral TID  . pantoprazole  20 mg Oral Daily  . sodium chloride flush  3 mL Intravenous Q12H   Continuous Infusions: . sodium chloride     PRN Meds:.sodium chloride, acetaminophen **OR** acetaminophen, bisacodyl, dextrose, ondansetron **OR** ondansetron (ZOFRAN) IV, polyethylene glycol, sodium chloride flush  Current Labs: reviewed    Physical Exam:  Blood pressure (!) 119/58, pulse 75, temperature 98.5 F (36.9 C), temperature source Oral, resp. rate 17, height 5\' 5"  (1.651 m), weight 72 kg, SpO2 100 %. GEN: Chronically ill-appearing, NAD, sitting on edge of bed ENT: NCAT EYES: EOMI CV: Regular, normal S1 and S2, no rub PULM: bibasilar crackles, wearing O2 ABD: Soft, nontender SKIN: No rashes or lesions EXT: Trace edema NEURO: Nonfocal, CN II through XII intact   A/P 1. AoCKD3: By chart review she has been labeled as diuretic sensitive, and this certainly could be true.  Renal US w/o obstruction.  Stable GFR.  Resumed PO diuretics given some hypoxia.  Cr getting better, downtrending.  Continue to follow.    2. CHF, imaging suggest residual problems and still has some oxygen requirement though at her baseline.  By weights seems similar to previous  admission.  Resumed oral regimen. 3. Anemia, per TRH 4. DM2 5. Gout 6. Hypertension   Madelon Lips MD 02/18/2019, 2:51 PM  Recent Labs  Lab 02/16/19 0347 02/17/19 0324 02/18/19 0250  NA 137 138 137  K 4.4 4.7 4.9  CL 96* 99 98  CO2 31 29 30   GLUCOSE 83 95 129*  BUN 44* 43* 40*  CREATININE 2.40* 2.32* 2.08*  CALCIUM 9.2 9.2 9.3   Recent Labs  Lab 02/16/19 0347 02/17/19 0324 02/18/19 0250  WBC 3.3* 3.8* 4.1  NEUTROABS 2.2 2.6 2.9  HGB 7.4* 7.7* 7.9*  HCT 24.3* 25.2* 25.9*  MCV 89.0 89.0 89.3  PLT 214 235 258

## 2019-02-18 NOTE — Progress Notes (Signed)
Physical Therapy Treatment Patient Details Name: Kara Hanson MRN: [id number] DOB: [birth year] Today's Date: 02/18/2019    History of Present Illness Patient is a 63 y/o female who presents with SOB. Admitted with acute combined systolic/diastolic congestive heart failure. PMH includes CHF, COVID positive 4/9-4/22 and COVID with PNA 6/22, CVA, HTN, DM.    PT Comments    Patient received in bed, motivated to participate in PT today. Able to complete bed mobility with Mod(I)/extended time, then functional transfers with S/cues for hand placement and tolerated progressing gait to approximately 160f with RW and S although she did require multiple standing rest breaks due to B shoulder discomfort and fatigue. SpO2 remained between 95-100% on 4LPM O2 with activity today, but patient limited by fatigue. She politely declines up to chair due to back pain despite offered chair modifications by PT, left in bed with alarm active and all needs otherwise met this afternoon.    Follow Up Recommendations  Home health PT;Supervision for mobility/OOB     Equipment Recommendations  None recommended by PT    Recommendations for Other Services       Precautions / Restrictions Precautions Precautions: Fall Precaution Comments: 4L/min 02 Restrictions Weight Bearing Restrictions: Yes RUE Weight Bearing: Partial weight bearing RUE Partial Weight Bearing Percentage or Pounds: < 5lbs,post cath.restrictions    Mobility  Bed Mobility Overal bed mobility: Modified Independent Bed Mobility: Supine to Sit;Sit to Supine     Supine to sit: Modified independent (Device/Increase time);HOB elevated     General bed mobility comments: increased time/effort; use of rail  Transfers Overall transfer level: Needs assistance Equipment used: Rolling walker (2 wheeled) Transfers: Sit to/from Stand Sit to Stand: Supervision         General transfer comment: supervision for safety, cues for hand  placement  Ambulation/Gait Ambulation/Gait assistance: Supervision Gait Distance (Feet): 160 Feet Assistive device: Rolling walker (2 wheeled) Gait Pattern/deviations: Step-through pattern;Decreased stride length Gait velocity: decr   General Gait Details: spO2 95-100% on 4LPM O2, slow and steady with B UEs but multiple standing rest breaks due to shoulder discomfort with RW and fatigue   Stairs             Wheelchair Mobility    Modified Rankin (Stroke Patients Only)       Balance Overall balance assessment: Needs assistance Sitting-balance support: No upper extremity supported;Feet supported Sitting balance-Leahy Scale: Normal     Standing balance support: During functional activity;No upper extremity supported Standing balance-Leahy Scale: Fair Standing balance comment: benefits from B UE support                            Cognition Arousal/Alertness: Awake/alert Behavior During Therapy: WFL for tasks assessed/performed Overall Cognitive Status: Within Functional Limits for tasks assessed                                 General Comments: pt very soft spoken. interactive but grossly flat affect but more animated after good news from renal MD then after encouragement from PT      Exercises      General Comments General comments (skin integrity, edema, etc.): SpO2 95-100% with activity on 4LPM O2 per [place]      Pertinent Vitals/Pain Pain Assessment: No/denies pain Pain Score: 0-No pain Pain Intervention(s): Limited activity within patient's tolerance;Monitored during session    Home Living  Prior Function            PT Goals (current goals can now be found in the care plan section) Acute Rehab PT Goals Patient Stated Goal: to move out of her current living situation PT Goal Formulation: With patient Time For Goal Achievement: 02/26/19 Potential to Achieve Goals: Good Progress towards PT goals:  Progressing toward goals    Frequency    Min 3X/week      PT Plan Current plan remains appropriate    Co-evaluation              AM-PAC PT "6 Clicks" Mobility   Outcome Measure  Help needed turning from your back to your side while in a flat bed without using bedrails?: None Help needed moving from lying on your back to sitting on the side of a flat bed without using bedrails?: A Little Help needed moving to and from a bed to a chair (including a wheelchair)?: A Little Help needed standing up from a chair using your arms (e.g., wheelchair or bedside chair)?: A Little Help needed to walk in hospital room?: A Little Help needed climbing 3-5 steps with a railing? : A Little 6 Click Score: 19    End of Session Equipment Utilized During Treatment: Oxygen Activity Tolerance: Patient tolerated treatment well Patient left: in bed;with call bell/phone within reach;with bed alarm set   PT Visit Diagnosis: Muscle weakness (generalized) (M62.81);Difficulty in walking, not elsewhere classified (R26.2)     Time: 7915-0569 PT Time Calculation (min) (ACUTE ONLY): 23 min  Charges:  $Gait Training: 8-22 mins $Therapeutic Activity: 8-22 mins                     Windell Norfolk, DPT, CBIS  Supplemental Physical Therapist [place]    Pager [phone] Acute Rehab Office [phone]

## 2019-02-18 NOTE — Progress Notes (Signed)
Progress Note  Patient Name: Kara Hanson Date of Encounter: 02/18/2019  Primary Cardiologist: Candee Furbish, MD   Subjective   63 year old female with a history of stress-induced cardiomyopathy, diabetes mellitus, hypertension and chronic renal insufficiency is admitted with recurrent congestive heart failure and mildly elevated troponin levels.  Ejection fraction is 30 to 35%.  Heart catheterization revealed nonobstructive coronary artery disease.  Left ventricular end-diastolic pressure was 26 at the time of heart catheterization.  Creatinine this morning is 2.08.  Were unable to add ACE inhibitor's or ARB. She has diuresed a total of 1.5 L so far during this admission. She is feeling better   Inpatient Medications    Scheduled Meds: . allopurinol  150 mg Oral Daily  . aspirin EC  81 mg Oral Daily  . atorvastatin  80 mg Oral q1800  . carvedilol  25 mg Oral BID WC  . DULoxetine  30 mg Oral Daily  . enoxaparin (LOVENOX) injection  30 mg Subcutaneous Q24H  . feeding supplement (GLUCERNA SHAKE)  237 mL Oral TID BM  . furosemide  80 mg Oral BID  . gabapentin  100 mg Oral BID  . insulin aspart  0-9 Units Subcutaneous Q4H  . isosorbide-hydrALAZINE  1 tablet Oral TID  . pantoprazole  20 mg Oral Daily  . sodium chloride flush  3 mL Intravenous Q12H   Continuous Infusions: . sodium chloride     PRN Meds: sodium chloride, acetaminophen **OR** acetaminophen, bisacodyl, dextrose, ondansetron **OR** ondansetron (ZOFRAN) IV, polyethylene glycol, sodium chloride flush   Vital Signs    Vitals:   02/17/19 1400 02/17/19 1600 02/17/19 1923 02/18/19 0639  BP: 138/61 (!) 141/52 (!) 140/57 (!) 143/61  Pulse: 78  66 71  Resp: 16  15 17   Temp: 98.1 F (36.7 C)  98.6 F (37 C) 97.8 F (36.6 C)  TempSrc: Oral  Oral Oral  SpO2:  99% 100% 95%  Weight:    72 kg  Height:        Intake/Output Summary (Last 24 hours) at 02/18/2019 0948 Last data filed at 02/18/2019 0900 Gross per 24  hour  Intake 890 ml  Output 850 ml  Net 40 ml   Last 3 Weights 02/18/2019 02/17/2019 02/16/2019  Weight (lbs) 158 lb 12.8 oz 160 lb 3.2 oz 159 lb 14.4 oz  Weight (kg) 72.031 kg 72.666 kg 72.53 kg      Telemetry   NSR  - Personally Reviewed  ECG     nsr  - Personally Reviewed  Physical Exam   GEN: No acute distress.   Neck: No JVD Cardiac: RRR,   6-2/7 systolic murmur  Respiratory: Clear to auscultation bilaterally. GI: Soft, nontender, non-distended  MS: No edema; No deformity. Neuro:  Nonfocal  Psych: Normal affect   Labs    High Sensitivity Troponin:   Recent Labs  Lab 01/23/19 2034 02/08/19 2330 02/09/19 0114 02/09/19 0615 02/10/19 0919  TROPONINIHS 13 25* 660* 3,166* 1,015*      Chemistry Recent Labs  Lab 02/16/19 0347 02/17/19 0324 02/18/19 0250  NA 137 138 137  K 4.4 4.7 4.9  CL 96* 99 98  CO2 31 29 30   GLUCOSE 83 95 129*  BUN 44* 43* 40*  CREATININE 2.40* 2.32* 2.08*  CALCIUM 9.2 9.2 9.3  GFRNONAA 21* 22* 25*  GFRAA 24* 25* 28*  ANIONGAP 10 10 9      Hematology Recent Labs  Lab 02/16/19 0347 02/17/19 0324 02/18/19 0250  WBC 3.3* 3.8* 4.1  RBC 2.73* 2.83* 2.90*  HGB 7.4* 7.7* 7.9*  HCT 24.3* 25.2* 25.9*  MCV 89.0 89.0 89.3  MCH 27.1 27.2 27.2  MCHC 30.5 30.6 30.5  RDW 15.0 14.7 14.9  PLT 214 235 258    BNPNo results for input(s): BNP, PROBNP in the last 168 hours.   DDimer No results for input(s): DDIMER in the last 168 hours.   Radiology    US Renal  Result Date: 02/16/2019 CLINICAL DATA:  Initial evaluation for acute renal injury. EXAM: RENAL / URINARY TRACT ULTRASOUND COMPLETE COMPARISON:  Prior ultrasound from 01/28/2019. FINDINGS: Right Kidney: Renal measurements: 10.3 x 5.0 x 5.6 cm = volume: 151.0 mL. Echogenicity within normal limits. No mass or hydronephrosis visualized. Left Kidney: Renal measurements: 10.7 x 5.2 x 5.4 cm = volume: 157.6 mL. Echogenicity within normal limits. No mass or hydronephrosis visualized.  Bladder: Appears normal for degree of bladder distention. Other: None. IMPRESSION: Normal renal ultrasound.  No hydronephrosis or other acute finding. Electronically Signed   By: Jeannine Boga M.D.   On: 02/16/2019 13:48    Cardiac Studies      Patient Profile     63 y.o. female   [place]    1.  Acute on chronic combined systolic and diastolic congestive heart failure: Patient is diuresed and is feeling better. We have been limited by her CKD to some extent.  Heart catheterization revealed nonobstructive coronary artery disease.  I suspect she will be ready for DC soon      For questions or updates, please contact [place] Please consult [email or web address] for contact info under        Signed, Mertie Moores, MD  02/18/2019, 9:48 AM

## 2019-02-18 NOTE — Progress Notes (Signed)
PT Cancellation Note  Patient Details Name: Kara Hanson MRN: [id number] DOB: February 09, 1956   Cancelled Treatment:    Reason Eval/Treat Not Completed: Other (comment) patient on [facility], requests PT return later. Will attempt to return if time/schedule allow.    Windell Norfolk, DPT, CBIS  Supplemental Physical Therapist [facility]    Pager [phone] Acute Rehab Office [phone]

## 2019-02-18 NOTE — Care Management Important Message (Signed)
Important Message  Patient Details  Name: Kara Hanson MRN: [id number] Date of Birth: December 04, 1955   Medicare Important Message Given:  Yes     [name] 02/18/2019, 2:25 PM

## 2019-02-18 NOTE — Plan of Care (Signed)
  Problem: Pain Managment: Goal: General experience of comfort will improve Outcome: Progressing Note: Resting comfortably with no c/o pain or discomfort.   Problem: Cardiovascular: Goal: Vascular access site(s) Level 0-1 will be maintained Outcome: Progressing Note: Right radial site level 0.

## 2019-02-18 NOTE — Progress Notes (Signed)
PROGRESS NOTE  Kara Hanson ZJQ:[id number] DOB: [birth year] DOA: 02/08/2019 PCP: [name], MD  HPI/Recap of past 24 hours: Kara Hanson is a 63 y.o. female with history of nonischemic cardiomyopathy last EF measured was 45% last month diabetes mellitus type 2, chronic kidney disease stage III, hypertension just discharged home yesterday after being admitted for acute CHF and patient also was recently admitted in April 2020 for Covid pneumonia subsequent which patient also was admitted for community-acquired pneumonia and August 2020 presents to the ER with complaint of sudden onset of worsening shortness of breath since yesterday afternoon on the day after discharge.  Patient also started developing retrosternal chest pressure with no associated fever chills nausea vomiting productive cough.  EMS was called and patient was brought to the ER. In the ER patient was requiring 100% nonrebreather and eventually was placed on BiPAP. Chest x-ray is consistent with CHF.  EKG shows sinus rhythm with PVCs with nonspecific T wave changes.  Initial troponin was around 25 and repeat was 660.  BNP was 380.  Patient was given Lasix total of 80 mg IV.  Cardiology on-call was consulted requested starting heparin and nitroglycerin infusion.  Patient admitted for acute CHF and possible non-STEMI.  COVID-19 test negative.     Today, patient seen and examined at bedside.  Feels her breathing has slightly improved, but not yet back to baseline.  Denies any chest pain, fever/chills, nausea/vomiting/diarrhea.   Assessment/Plan: Principal Problem:   Acute respiratory failure with hypoxia (HCC) Active Problems:   Controlled type 2 diabetes mellitus with hyperglycemia (HCC)   Acute on chronic combined systolic and diastolic CHF (congestive heart failure) (HCC)   CKD (chronic kidney disease), stage III   NSTEMI (non-ST elevated myocardial infarction) ([place])  Acute on chronic hypoxic respiratory failure likely  secondary to acute on chronic combined diastolic and systolic CHF.   At baseline she is on 4 L of oxygen by nasal cannula continuously Currently on 4 L with O2 saturation well above 95% Chest x-ray done on admission which showed cardiomegaly with increase in pulmonary vascularity right greater than left suggestive of pulmonary edema, repeat chest x-ray showed improving congestion, no new abnormalities Continue to maintain O2 saturation greater than 92%  Acute on chronic combined systolic and diastolic CHF BNP 240 with 02/10/2019 Last 2D echo with LVEF 40-45% on 01/24/2019. Repeated TTE done on 02/10/2019 showed LVEF 30 to 35% with grade 3 diastolic dysfunction. Continue strict I's and O's and daily weight Net INO -1647 Cardiology on board, continue Coreg, BiDil, deferred diuretic dosing to nephrology Continue p.o. Lasix  AKI on CKD 3 Continues to improve slowly Baseline creatinine appears to be 1.3 with GFR 46 Nephrology consulted, recommend switching to p.o. Lasix Losartan discontinued per cardiology Daily BMP  Cough likely secondary to pulmonary edema in the setting of acute on chronic systolic CHF Continue Tessalon Perles increase dose to 200 mg 3 times daily  Elevated troponin Troponin is peaked at greater than 3100 and trending down  RHC/LHC 02/11/2019 showed nonobstructive coronary artery disease Cardiology on board  DM 2 with hypoglycemia A1c 7.8 Held Lantus Continue sensitive insulin sliding scale, hypoglycemic protocol, Accu-Cheks  History of gout Continue allopurinol  Hyperlipidemia Continue Lipitor  Chronic normocytic anemia likely from CKD Hemoglobin stable No signs of bleeding  History of COVID-19 pneumonia Currently afebrile Repeat Covid test on this admission negative Tested positive on 09/24/2018    DVT prophylaxis:  Subcu Lovenox daily Code Status: Full code. Family Communication:  None  at bedside Disposition Plan:  To be determined, likely home  Consults called: Cardiology, nephrology     Objective: Vitals:   02/17/19 1600 02/17/19 1923 02/18/19 0639 02/18/19 1410  BP: (!) 141/52 (!) 140/57 (!) 143/61 (!) 119/58  Pulse:  66 71 75  Resp:  15 17   Temp:  98.6 F (37 C) 97.8 F (36.6 C) 98.5 F (36.9 C)  TempSrc:  Oral Oral Oral  SpO2: 99% 100% 95% 100%  Weight:   72 kg   Height:        Intake/Output Summary (Last 24 hours) at 02/18/2019 1654 Last data filed at 02/18/2019 1300 Gross per 24 hour  Intake 527 ml  Output 850 ml  Net -323 ml   Filed Weights   02/16/19 0530 02/17/19 0500 02/18/19 0639  Weight: 72.5 kg 72.7 kg 72 kg    Exam:  General: NAD, chronically ill-appearing, chronic tremors noted around her mouth/bilateral hands  Cardiovascular: S1, S2 present  Respiratory:  Bibasilar crackles noted  Abdomen: Soft, nontender, nondistended, bowel sounds present  Musculoskeletal: No bilateral pedal edema noted  Skin: Normal  Psychiatry: Normal mood   Data Reviewed: CBC: Recent Labs  Lab 02/14/19 0333 02/15/19 0333 02/16/19 0347 02/17/19 0324 02/18/19 0250  WBC 3.7* 4.2 3.3* 3.8* 4.1  NEUTROABS 2.6 3.0 2.2 2.6 2.9  HGB 7.6* 7.5* 7.4* 7.7* 7.9*  HCT 25.3* 24.9* 24.3* 25.2* 25.9*  MCV 90.7 90.5 89.0 89.0 89.3  PLT 215 207 214 235 017   Basic Metabolic Panel: Recent Labs  Lab 02/14/19 0333 02/15/19 0333 02/16/19 0347 02/17/19 0324 02/18/19 0250  NA 139 137 137 138 137  K 4.7 4.8 4.4 4.7 4.9  CL 99 97* 96* 99 98  CO2 32 29 31 29 30   GLUCOSE 50* 121* 83 95 129*  BUN 42* 45* 44* 43* 40*  CREATININE 2.28* 2.37* 2.40* 2.32* 2.08*  CALCIUM 9.2 8.9 9.2 9.2 9.3   GFR: Estimated Creatinine Clearance: 27.2 mL/min (A) (by C-G formula based on SCr of 2.08 mg/dL (H)). Liver Function Tests: No results for input(s): AST, ALT, ALKPHOS, BILITOT, PROT, ALBUMIN in the last 168 hours. No results for input(s): LIPASE, AMYLASE in the last 168 hours. No results for input(s): AMMONIA in the last 168  hours. Coagulation Profile: No results for input(s): INR, PROTIME in the last 168 hours. Cardiac Enzymes: No results for input(s): CKTOTAL, CKMB, CKMBINDEX, TROPONINI in the last 168 hours. BNP (last 3 results) No results for input(s): PROBNP in the last 8760 hours. HbA1C: No results for input(s): HGBA1C in the last 72 hours. CBG: Recent Labs  Lab 02/17/19 2036 02/18/19 0016 02/18/19 0451 02/18/19 0744 02/18/19 1113  GLUCAP 128* 137* 123* 128* 152*   Lipid Profile: No results for input(s): CHOL, HDL, LDLCALC, TRIG, CHOLHDL, LDLDIRECT in the last 72 hours. Thyroid Function Tests: No results for input(s): TSH, T4TOTAL, FREET4, T3FREE, THYROIDAB in the last 72 hours. Anemia Panel: No results for input(s): VITAMINB12, FOLATE, FERRITIN, TIBC, IRON, RETICCTPCT in the last 72 hours. Urine analysis:    Component Value Date/Time   COLORURINE YELLOW 01/28/2019 0714   APPEARANCEUR TURBID (A) 01/28/2019 0714   LABSPEC 1.015 01/28/2019 0714   PHURINE 5.0 01/28/2019 0714   GLUCOSEU 50 (A) 01/28/2019 0714   HGBUR NEGATIVE 01/28/2019 0714   BILIRUBINUR NEGATIVE 01/28/2019 0714   BILIRUBINUR negative 12/13/2017 [place] 01/28/2019 0714   PROTEINUR >=300 (A) 01/28/2019 0714   UROBILINOGEN 0.2 12/13/2017 1514   NITRITE NEGATIVE 01/28/2019  [place] (A) 01/28/2019 0714   Sepsis Labs: @LABRCNTIP (procalcitonin:4,lacticidven:4)  ) Recent Results (from the past 240 hour(s))  SARS CORONAVIRUS 2 (TAT 6-24 HRS) Nasopharyngeal Nasopharyngeal Swab     Status: None   Collection Time: 02/09/19  3:00 AM   Specimen: Nasopharyngeal Swab  Result Value Ref Range Status   SARS Coronavirus 2 NEGATIVE NEGATIVE Final    Comment: (NOTE) SARS-CoV-2 target nucleic acids are NOT DETECTED. The SARS-CoV-2 RNA is generally detectable in upper and lower respiratory specimens during the acute phase of infection. Negative results do not preclude SARS-CoV-2 infection, do not  rule out co-infections with other pathogens, and should not be used as the sole basis for treatment or other patient management decisions. Negative results must be combined with clinical observations, patient history, and epidemiological information. The expected result is Negative. Fact Sheet for Patients: SugarRoll.be Fact Sheet for Healthcare Providers: https://www.woods-mathews.com/ This test is not yet approved or cleared by the [place] FDA and  has been authorized for detection and/or diagnosis of SARS-CoV-2 by FDA under an Emergency Use Authorization (EUA). This EUA will remain  in effect (meaning this test can be used) for the duration of the COVID-19 declaration under Section 56 4(b)(1) of the Act, 21 U.S.C. section 360bbb-3(b)(1), unless the authorization is terminated or revoked sooner. Performed at [facility], [place] [address], [place], [place] [address]       Studies: No results found.  Scheduled Meds: . allopurinol  150 mg Oral Daily  . aspirin EC  81 mg Oral Daily  . atorvastatin  80 mg Oral q1800  . carvedilol  25 mg Oral BID WC  . DULoxetine  30 mg Oral Daily  . enoxaparin (LOVENOX) injection  30 mg Subcutaneous Q24H  . feeding supplement (GLUCERNA SHAKE)  237 mL Oral TID BM  . furosemide  80 mg Oral BID  . gabapentin  100 mg Oral BID  . insulin aspart  0-9 Units Subcutaneous Q4H  . isosorbide-hydrALAZINE  1 tablet Oral TID  . pantoprazole  20 mg Oral Daily  . sodium chloride flush  3 mL Intravenous Q12H    Continuous Infusions: . sodium chloride       LOS: 9 days     Alma Friendly, MD [facility]  If 7PM-7AM, please contact night-coverage [email or web address] 02/18/2019, 4:54 PM

## 2019-02-19 LAB — GLUCOSE, CAPILLARY
Glucose-Capillary: 102 mg/dL — ABNORMAL HIGH (ref 70–99)
Glucose-Capillary: 107 mg/dL — ABNORMAL HIGH (ref 70–99)
Glucose-Capillary: 128 mg/dL — ABNORMAL HIGH (ref 70–99)
Glucose-Capillary: 136 mg/dL — ABNORMAL HIGH (ref 70–99)
Glucose-Capillary: 140 mg/dL — ABNORMAL HIGH (ref 70–99)
Glucose-Capillary: 145 mg/dL — ABNORMAL HIGH (ref 70–99)
Glucose-Capillary: 82 mg/dL (ref 70–99)
Glucose-Capillary: 91 mg/dL (ref 70–99)
Glucose-Capillary: 96 mg/dL (ref 70–99)
Glucose-Capillary: 86 mg/dL (ref 70–99)
Glucose-Capillary: 160 mg/dL — ABNORMAL HIGH (ref 70–99)
Glucose-Capillary: 156 mg/dL — ABNORMAL HIGH (ref 70–99)

## 2019-02-19 LAB — BASIC METABOLIC PANEL
Anion gap: 10 (ref 5–15)
BUN: 37 mg/dL — ABNORMAL HIGH (ref 8–23)
CO2: 31 mmol/L (ref 22–32)
Calcium: 9.5 mg/dL (ref 8.9–10.3)
Chloride: 97 mmol/L — ABNORMAL LOW (ref 98–111)
Creatinine, Ser: 1.95 mg/dL — ABNORMAL HIGH (ref 0.44–1.00)
GFR calc Af Amer: 31 mL/min — ABNORMAL LOW (ref >60)
GFR calc non Af Amer: 27 mL/min — ABNORMAL LOW (ref >60)
Glucose, Bld: 105 mg/dL — ABNORMAL HIGH (ref 70–99)
Potassium: 4.8 mmol/L (ref 3.5–5.1)
Sodium: 138 mmol/L (ref 135–145)

## 2019-02-19 MED ORDER — BISACODYL 5 MG PO TBEC: 10.0000 mg | DELAYED_RELEASE_TABLET | Freq: Every day | ORAL | 0 refills | Status: AC | PRN

## 2019-02-19 MED ORDER — FUROSEMIDE 80 MG PO TABS: 80.0000 mg | ORAL_TABLET | Freq: Two times a day (BID) | ORAL | 0 refills | Status: DC

## 2019-02-19 MED ORDER — ATORVASTATIN CALCIUM 80 MG PO TABS: 80.0000 mg | ORAL_TABLET | Freq: Every day | ORAL | 0 refills | Status: DC

## 2019-02-19 NOTE — Progress Notes (Signed)
  [facility] Progress Note    Assessment/ Plan:   1. AoCKD3:By chart review she has been labeled as diuretic sensitive, and this certainly could be true.  Renal US w/o obstruction.  Stable GFR.  Resumed PO diuretics given some hypoxia.  Cr getting better, downtrending for 2 days on stable PO diuretics with downtrending weights.  Nothing further to add.  Will sign off.  Will arrange f/u with [place] in clinic.   2. CHF, stress induced cardiomyopathy, imaging suggest residual problems and still has some oxygen requirement though at her baseline. By weights seems similar to previous admission. Resumed oral regimen. 3. Anemia, per [facility] 4. DM2 5. Gout 6. Hypertension  Subjective:    Weights down, feeling better.  Cr better as well.  Still on O2.     Objective:   BP (!) 126/53 (BP Location: Right Arm)   Pulse 72   Temp 98.4 F (36.9 C) (Oral)   Resp 20   Ht 5\' 5"  (1.651 m)   Wt 70.5 kg   SpO2 99%   BMI 25.88 kg/m   Intake/Output Summary (Last 24 hours) at 02/19/2019 1336 Last data filed at 02/19/2019 1244 Gross per 24 hour  Intake 1013 ml  Output 1700 ml  Net -687 ml   Weight change: -1.497 kg  Physical Exam: EHM:[id number] ill-appearing, NAD, sitting in chair OQH:UTML EYES:EOMI YY:TKPTWSF, normal S1 and S2, no rub PULM:bibasilar crackles getting better KCL:EXNT, nontender SKIN:No rashes or lesions ZGY:FVCBS edema NEURO: Nonfocal, CN II through XII intact  Imaging: No results found.  Labs: BMET Recent Labs  Lab 02/13/19 0247 02/14/19 0333 02/15/19 0333 02/16/19 0347 02/17/19 0324 02/18/19 0250 02/19/19 0256  NA 138 139 137 137 138 137 138  K 4.3 4.7 4.8 4.4 4.7 4.9 4.8  CL 99 99 97* 96* 99 98 97*  CO2 29 32 29 31 29 30 31   GLUCOSE 98 50* 121* 83 95 129* 105*  BUN 39* 42* 45* 44* 43* 40* 37*  CREATININE 1.89* 2.28* 2.37* 2.40* 2.32* 2.08* 1.95*  CALCIUM 9.2 9.2 8.9 9.2 9.2 9.3 9.5   CBC Recent Labs  Lab 02/15/19 0333  02/16/19 0347 02/17/19 0324 02/18/19 0250  WBC 4.2 3.3* 3.8* 4.1  NEUTROABS 3.0 2.2 2.6 2.9  HGB 7.5* 7.4* 7.7* 7.9*  HCT 24.9* 24.3* 25.2* 25.9*  MCV 90.5 89.0 89.0 89.3  PLT 207 214 235 258    Medications:    . allopurinol  150 mg Oral Daily  . aspirin EC  81 mg Oral Daily  . atorvastatin  80 mg Oral q1800  . carvedilol  25 mg Oral BID WC  . DULoxetine  30 mg Oral Daily  . enoxaparin (LOVENOX) injection  30 mg Subcutaneous Q24H  . feeding supplement (GLUCERNA SHAKE)  237 mL Oral TID BM  . furosemide  80 mg Oral BID  . gabapentin  100 mg Oral BID  . insulin aspart  0-9 Units Subcutaneous Q4H  . isosorbide-hydrALAZINE  1 tablet Oral TID  . pantoprazole  20 mg Oral Daily  . sodium chloride flush  3 mL Intravenous Q12H      Madelon Lips, MD 02/19/2019, 1:36 PM

## 2019-02-19 NOTE — Discharge Summary (Signed)
Discharge Summary  Kara Hanson BMS:[id number] DOB: [birth year]  PCP: [name], MD  Admit date: 02/08/2019 Discharge date: 02/19/2019  Time spent: 40 mins  Recommendations for Outpatient Follow-up:  1. PCP in 1 week 2. Nephrology as scheduled 3. Cardiology as scheduled  Discharge Diagnoses:  Active Hospital Problems   Diagnosis Date Noted   Acute respiratory failure with hypoxia ([facility]) 02/09/2019   NSTEMI (non-ST elevated myocardial infarction) ([facility]) 02/09/2019   CKD (chronic kidney disease), stage III 03/20/2018   Acute on chronic combined systolic and diastolic CHF (congestive heart failure) ([facility]) 11/27/2017   Controlled type 2 diabetes mellitus with hyperglycemia ([place]) 04/23/2016    Resolved Hospital Problems  No resolved problems to display.    Discharge Condition: Stable  Diet recommendation: Heart healthy/modified carb  Vitals:   02/19/19 0430 02/19/19 1319  BP: (!) 131/58 (!) 126/53  Pulse: 80 72  Resp:  20  Temp: 97.7 F (36.5 C) 98.4 F (36.9 C)  SpO2: 97% 99%    History of present illness:  Kara Reichardis a 63 y.o.femalewithhistory of nonischemic cardiomyopathy last EF measured was 45% last month diabetes mellitus type 2, chronic kidney disease stage III, hypertension just discharged home yesterday after being admitted for acute CHF and patient also was recently admitted in April 2020 for Covid pneumonia subsequent which patient also was admitted for community-acquired pneumonia and August 2020 presents to the ER with complaint of sudden onset of worsening shortness of breath since yesterday afternoon on the day after discharge. Patient also started developing retrosternal chest pressure with no associated fever chills nausea vomiting productive cough. EMS was called and patient was brought to the ER. In the ER patient was requiring 100% nonrebreather and eventually was placed on BiPAP. Chest x-ray is consistent with CHF. EKG shows sinus  rhythm with PVCs with nonspecific T wave changes. Initial troponin was around 25 and repeat was 660. BNP was 380. Patient was given Lasix total of 80 mg IV. Cardiology on-call was consulted requested starting heparin and nitroglycerin infusion. Patient admitted for acute CHF and possible non-STEMI. COVID-19 test negative.    Today, patient reports feeling better, dyspnea has improved, back to her baseline.  Denies any chest pain, worsening shortness of breath, nausea/vomiting/diarrhea, fever/chills, worsening cough.  Patient stable for discharge to follow-up with PCP in 1 week, cardiology and nephrology as scheduled.  Patient discharged home with home health physical therapy.   Hospital Course:  Principal Problem:   Acute respiratory failure with hypoxia ([facility]) Active Problems:   Controlled type 2 diabetes mellitus with hyperglycemia ([facility])   Acute on chronic combined systolic and diastolic CHF (congestive heart failure) ([facility])   CKD (chronic kidney disease), stage III   NSTEMI (non-ST elevated myocardial infarction) ([place])   Acute on chronic hypoxic respiratory failure likely secondary to acute on chronic combined diastolic and systolic CHF Improved  At baseline she is on 4 L of oxygen by nasal cannula continuously Currently on 4 L with O2 saturation well above 95% Chest x-ray done on admission which showed cardiomegaly with increase in pulmonary vascularity right greater than left suggestive of pulmonary edema, repeat chest x-ray showed improving congestion, no new abnormalities Follow-up with cardiology as an outpatient  Acute on chronic combined systolic and diastolic CHF BNP 223 with 02/10/2019 Last 2D echo with LVEF 40-45% on 01/24/2019. Repeated TTE done on 02/10/2019 showed LVEF 30 to 35% with grade 3 diastolic dysfunction Net INO -1960 Cardiology on board, continue Coreg, BiDil, recommended discontining losartan Fluid overload managed  by nephrology, Dr. Hollie Salk recommend  discharging patient on p.o. Lasix 80 mg twice daily.  Follow-up in nephrology clinic  AKI on CKD 3 Continues to improve slowly Baseline creatinine appears to be 1.3 with GFR 46 Nephrology on board, follow-up in outpatient clinic  Elevated troponin Troponin is peaked at greater than 3100 and trending down  RHC/LHC 02/11/2019 showed nonobstructive coronary artery disease Cardiology on board  DM 2 with hypoglycemia Hypoglycemia resolved Continue home regimen  History of gout Continue allopurinol  Hyperlipidemia Continue Lipitor  Chronic normocytic anemia likely from CKD Hemoglobin stable No signs of bleeding  History of COVID-19 pneumonia Currently afebrile Repeat Covid test on this admission negative Tested positive on 09/24/2018           Malnutrition Type:      Malnutrition Characteristics:      Nutrition Interventions:      Estimated body mass index is 25.88 kg/m as calculated from the following:   Height as of this encounter: _0  (1.651 m).   Weight as of this encounter: 70.5 kg.    Procedures:    Consultations:  Cardiology  Nephrology  Discharge Exam: BP (!) 126/53 (BP Location: Right Arm)    Pulse 72    Temp 98.4 F (36.9 C) (Oral)    Resp 20    Ht _1  (1.651 m)    Wt 70.5 kg    SpO2 99%    BMI 25.88 kg/m   General: NAD Cardiovascular: S1, S2 present Respiratory: Mild bibasilar crackles noted  Discharge Instructions You were cared for by a hospitalist during your hospital stay. If you have any questions about your discharge medications or the care you received while you were in the hospital after you are discharged, you can call the unit and asked to speak with the hospitalist on call if the hospitalist that took care of you is not available. Once you are discharged, your primary care physician will handle any further medical issues. Please note that NO REFILLS for any discharge medications will be authorized once you are  discharged, as it is imperative that you return to your primary care physician (or establish a relationship with a primary care physician if you do not have one) for your aftercare needs so that they can reassess your need for medications and monitor your lab values.  Discharge Instructions    Diet - low sodium heart healthy   Complete by: As directed    Increase activity slowly   Complete by: As directed      Allergies as of 02/19/2019      Reactions   Garlic Shortness Of Breath, Itching, Swelling   Hand itching and swelling   Latex Itching   Morphine And Related Itching, Other (See Comments)   Headache   Other Itching   Reaction to newspaper ink -itching and headache      Medication List    TAKE these medications   Accu-Chek FastClix Lancets Misc USE AS DIRECTED TO TEST BLOOD SUGAR THREE TIMES DAILY   Accu-Chek Guide w/Device Kit 1 each by Does not apply route 3 (three) times daily.   allopurinol 300 MG tablet Commonly known as: ZYLOPRIM Take 0.5 tablets (150 mg total) by mouth daily.   aspirin EC 81 MG tablet Take 81 mg by mouth daily.   atorvastatin 80 MG tablet Commonly known as: LIPITOR Take 1 tablet (80 mg total) by mouth daily at 6 PM.   Basaglar KwikPen 100 UNIT/ML Sopn Inject 0.05 mLs (5  Units total) into the skin at bedtime. What changed: how much to take   bisacodyl 5 MG EC tablet Commonly known as: DULCOLAX Take 2 tablets (10 mg total) by mouth daily as needed for moderate constipation.   carvedilol 25 MG tablet Commonly known as: COREG Take 1 tablet (25 mg total) by mouth 2 (two) times daily with a meal.   DULoxetine 30 MG capsule Commonly known as: CYMBALTA Take 1 capsule (30 mg total) by mouth daily.   Easy Comfort Pen Needles 31G X 5 MM Misc Generic drug: Insulin Pen Needle USE FOUR TIMES PER DAY FOR INSULIN ADMINISTRATION What changed: See the new instructions.   ergocalciferol 1.25 MG (50000 UT) capsule Commonly known as: Drisdol Take  1 capsule (50,000 Units total) by mouth once a week for 4 doses. What changed: when to take this   feeding supplement (ENSURE ENLIVE) Liqd Take 237 mLs by mouth 3 (three) times daily between meals.   feeding supplement (PRO-STAT SUGAR FREE 64) Liqd Take 30 mLs by mouth 2 (two) times daily.   furosemide 80 MG tablet Commonly known as: LASIX Take 1 tablet (80 mg total) by mouth 2 (two) times daily. What changed:   medication strength  how much to take  when to take this  additional instructions   gabapentin 100 MG capsule Commonly known as: NEURONTIN Take 1 capsule (100 mg total) by mouth 2 (two) times daily.   glucose blood test strip Commonly known as: Accu-Chek Guide USE AS DIRECTED TO TEST BLOOD SUGAR THREE TIMES DAILY   insulin aspart 100 UNIT/ML injection Commonly known as: novoLOG 0 to 12 units subcutaneously 3 times daily before meals as per sliding scale   isosorbide-hydrALAZINE 20-37.5 MG tablet Commonly known as: BIDIL Take 1 tablet by mouth 3 (three) times daily.   Lancet Device Misc Use as instructed 3 times daily   lansoprazole 15 MG capsule Commonly known as: PREVACID Take 15 mg by mouth daily.   metoCLOPramide 5 MG tablet Commonly known as: Reglan Take 1 tablet (5 mg total) by mouth 3 (three) times daily.   Misc. Devices Misc Portable oxygen concentrator.  Diagnosis-chronic respiratory failure.   Misc. Devices Misc Rollaor with seat. Dx: Congestive Heart Failure   polyethylene glycol 17 g packet Commonly known as: MIRALAX / GLYCOLAX Take 17 g by mouth daily. What changed:   when to take this  reasons to take this   sodium chloride 0.65 % Soln nasal spray Commonly known as: OCEAN Place 1 spray into both nostrils as needed for congestion.   Super B Complex/C Caps Take 1 capsule by mouth daily.      Allergies  Allergen Reactions   Garlic Shortness Of Breath, Itching and Swelling    Hand itching and swelling   Latex Itching    Morphine And Related Itching and Other (See Comments)    Headache    Other Itching    Reaction to newspaper ink -itching and headache   Follow-up Information    [facility] Follow up.   Specialty: Home Health Services Why: Physical  Therapy Contact information: [place] [address] [phone]        [name], MD. Schedule an appointment as soon as possible for a visit in 1 week(s).   Specialty: Family Medicine Contact information: [place] [address] [phone]        Jerline Pain, MD .   Specialty: Cardiology Contact information: [phone] [address].  [address] [place] [address] [phone]            The results of significant diagnostics from this hospitalization (including imaging, microbiology, ancillary and laboratory) are listed below for reference.    Significant Diagnostic Studies: Ct Abdomen Pelvis Wo Contrast  Result Date: 01/24/2019 CLINICAL DATA:  63 year old female with history of abdominal distension. Epigastric abdominal pain. EXAM: CT ABDOMEN AND PELVIS WITHOUT CONTRAST TECHNIQUE: Multidetector CT imaging of the abdomen and pelvis was performed following the standard protocol without IV contrast. COMPARISON:  CT the abdomen and pelvis 01/02/2018. FINDINGS: Lower chest: Moderate bilateral pleural effusions with areas of atelectasis and/or consolidation in the lower lobes of the lungs bilaterally. Atherosclerotic calcifications in the thoracic aorta as well as the left main, left anterior descending, left circumflex and right coronary arteries. Calcifications of the aortic valve. Cardiomegaly. Hepatobiliary: No suspicious cystic or solid hepatic lesions are confidently identified on today's noncontrast CT examination. Status post cholecystectomy. Pancreas: Fatty atrophy in the pancreas. No discrete pancreatic mass, no definite pancreatic mass or peripancreatic fluid collections or  inflammatory changes are noted on today's noncontrast CT examination. Spleen: Unremarkable. Adrenals/Urinary Tract: Unenhanced appearance of the kidneys and bilateral adrenal glands is unremarkable. No hydroureteronephrosis. Urinary bladder is unremarkable in appearance. Stomach/Bowel: Unenhanced appearance of the stomach is normal. There is no pathologic dilatation of small bowel or colon. Multiple scattered colonic diverticulae are noted, without surrounding inflammatory changes to suggest an acute diverticulitis at this time. Normal appendix. Vascular/Lymphatic: Aortic atherosclerosis. Retroaortic left renal vein (normal anatomical variant) incidentally noted. No lymphadenopathy identified in the abdomen or pelvis. Reproductive: Unenhanced appearance of the uterus and ovaries is unremarkable. Other: Small umbilical hernia containing only omental fat. No significant volume of ascites. No pneumoperitoneum. Musculoskeletal: There are no aggressive appearing lytic or blastic lesions noted in the visualized portions of the skeleton. IMPRESSION: 1. No acute findings are noted in the abdomen or pelvis to account for the patient's symptoms. 2. Moderate bilateral pleural effusions with dependent areas of atelectasis and/or consolidation in the lower lobes of the lungs bilaterally. 3. Colonic diverticulosis without evidence of acute diverticulitis at this time. 4. Small umbilical hernia containing only omental fat. No associated bowel incarceration or obstruction at this time. Electronically Signed   By: Vinnie Langton M.D.   On: 01/24/2019 18:09   Dg Chest 1 View  Result Date: 02/03/2019 CLINICAL DATA:  Follow-up possible pneumonia. EXAM: CHEST  1 VIEW COMPARISON:  02/01/2019 FINDINGS: The endotracheal tube has been removed. The cardiac silhouette remains mildly enlarged. Significantly improved aeration of the left lower lobe with residual airspace opacity with air bronchograms. Mild diffuse accentuation of the  interstitial markings with improvement. Minimal diffuse peribronchial thickening. Small left pleural effusion. Thoracic spine degenerative changes. IMPRESSION: 1. Significantly improved left lower lobe pneumonia/atelectasis. 2. Small left pleural effusion. 3. Minimal bronchitic changes. 4. Improved interstitial pulmonary edema with minimal residual interstitial pulmonary edema or chronic interstitial lung disease. 5. Mild cardiomegaly. Electronically Signed   By: Claudie Revering M.D.   On: 02/03/2019 08:10   Dg Chest 1 View  Result Date: 02/01/2019 CLINICAL DATA:  Post intubation EXAM: CHEST  1 VIEW COMPARISON:  Numerous recent priors, most recently 02/01/2019 FINDINGS: Low positioning of the endotracheal tube within the right mainstem bronchus. Recommend retraction 4 cm to the mid trachea. Improving aeration through the left lung. Residual airspace opacity is present in the mid to lower lungs with central air bronchograms. Stable prominence of the cardiac silhouette. No pneumothorax.  No acute osseous or soft tissue abnormality. Cholecystectomy clips in the right upper quadrant. IMPRESSION: 1. Low positioning of the endotracheal tube within the right mainstem bronchus. Recommend retraction approximately 4 cm to the mid trachea. 2. Improving aeration through the left lung. Residual airspace opacity in the mid to lower lungs with central air bronchograms, concerning for a superimposed pneumonia These results will be called to the ordering clinician or representative by the Radiologist Assistant, and communication documented in the PACS or zVision Dashboard. Electronically Signed   By: Lovena Le M.D.   On: 02/01/2019 17:34   Dg Chest 1 View  Result Date: 01/30/2019 CLINICAL DATA:  Status post left thoracentesis. EXAM: CHEST  1 VIEW COMPARISON:  January 29, 2019. FINDINGS: Stable cardiomediastinal silhouette. Increased left upper lobe opacity is noted concerning for pneumonia. Stable left basilar atelectasis  and/or pleural effusion is noted. Right lung is clear. No pneumothorax is noted. Bony thorax is unremarkable. IMPRESSION: No pneumothorax is noted. Increased left upper lobe opacity is noted concerning for pneumonia. Stable left basilar atelectasis and pleural effusion is noted. Electronically Signed   By: Marijo Conception M.D.   On: 01/30/2019 12:32   Dg Chest 2 View  Result Date: 02/01/2019 CLINICAL DATA:  Chest pain. EXAM: CHEST - 2 VIEW COMPARISON:  CT 01/31/2019. FINDINGS: Opacification of the left hemithorax again noted consistent with left lung atelectasis. Reference is made to prior CT report. Small right pleural effusion. No pneumothorax. Heart size cannot be evaluated. Degenerative change thoracic spine IMPRESSION: Opacification left hemithorax again noted consistent with left lung atelectasis again noted. No change from prior CT. Reference is made to prior CT report 01/31/2019. Electronically Signed   By: Marcello Moores  Register   On: 02/01/2019 11:42   Ct Head Wo Contrast  Result Date: 01/28/2019 CLINICAL DATA:  63 year old female with altered mental status. EXAM: CT HEAD WITHOUT CONTRAST TECHNIQUE: Contiguous axial images were obtained from the base of the skull through the vertex without intravenous contrast. COMPARISON:  Head CT dated 04/25/2016 FINDINGS: Brain: The ventricles and sulci appropriate size for patient's age. Mild periventricular and deep white matter chronic microvascular ischemic changes noted. There is no acute intracranial hemorrhage. No mass effect or midline shift. No extra-axial fluid collection. Vascular: No hyperdense vessel or unexpected calcification. Skull: Normal. Negative for fracture or focal lesion. Sinuses/Orbits: The visualized paranasal sinuses and the left mastoid air cells are clear. Right mastoid effusions noted. Other: None IMPRESSION: 1. No acute intracranial hemorrhage. 2. Mild chronic microvascular ischemic changes. 3. Right mastoid effusions. Electronically  Signed   By: Anner Crete M.D.   On: 01/28/2019 02:37   Ct Chest Wo Contrast  Result Date: 01/31/2019 CLINICAL DATA:  Shortness of breath. EXAM: CT CHEST WITHOUT CONTRAST TECHNIQUE: Multidetector CT imaging of the chest was performed following the standard protocol without IV contrast. COMPARISON:  Chest x-ray from yesterday. CTA chest dated November 27, 2017. FINDINGS: Cardiovascular: Unchanged mild cardiomegaly. No pericardial effusion. No thoracic aortic aneurysm. Coronary, aortic arch, and branch vessel atherosclerotic vascular disease. Mediastinum/Nodes: No pathologically enlarged mediastinal or axillary lymph nodes. Several prominent mediastinal lymph nodes measuring up to 9 mm in short axis are likely reactive. The thyroid gland, trachea, and esophagus demonstrate no significant findings. Lungs/Pleura: Prominent secretions within the left mainstem bronchus and left upper and lower lobe bronchi with near complete collapse of the left upper lobe and complete collapse of the left lower lobe. The presence of air bronchograms in the left upper and lower lobes is  suggestive of superimposed consolidation. Small to moderate bilateral pleural effusions. Dependent atelectasis in the right upper and lower lobes. Scattered mild interlobular septal thickening in the right lung. No pneumothorax. Upper Abdomen: No acute abnormality. Musculoskeletal: No chest wall mass or suspicious bone lesions identified. Mild anasarca. IMPRESSION: 1. Prominent secretions within the left mainstem and left upper and lower lobe bronchi with near complete collapse of the left upper lobe and complete collapse of the left lower lobe. The presence of air bronchograms in the left upper and lower lobes may reflect superimposed multifocal pneumonia. 2. Small to moderate bilateral pleural effusions. 3. Mild interstitial pulmonary edema. 4. Aortic atherosclerosis (ICD10-I70.0). Electronically Signed   By: Titus Dubin M.D.   On: 01/31/2019  15:51   US Renal  Result Date: 02/16/2019 CLINICAL DATA:  Initial evaluation for acute renal injury. EXAM: RENAL / URINARY TRACT ULTRASOUND COMPLETE COMPARISON:  Prior ultrasound from 01/28/2019. FINDINGS: Right Kidney: Renal measurements: 10.3 x 5.0 x 5.6 cm = volume: 151.0 mL. Echogenicity within normal limits. No mass or hydronephrosis visualized. Left Kidney: Renal measurements: 10.7 x 5.2 x 5.4 cm = volume: 157.6 mL. Echogenicity within normal limits. No mass or hydronephrosis visualized. Bladder: Appears normal for degree of bladder distention. Other: None. IMPRESSION: Normal renal ultrasound.  No hydronephrosis or other acute finding. Electronically Signed   By: Jeannine Boga M.D.   On: 02/16/2019 13:48   US Renal  Result Date: 01/28/2019 CLINICAL DATA:  63 year old female with acute renal insufficiency. History of diabetes and hypertension. Bladder surgery. EXAM: RENAL / URINARY TRACT ULTRASOUND COMPLETE COMPARISON:  Renal ultrasound dated 11/29/2017 FINDINGS: Right Kidney: Renal measurements: 11.3 x 5.6 x 5.3 cm = volume: 175 mL. Normal echogenicity. No hydronephrosis or shadowing stone. Left Kidney: Renal measurements: 10.3 x 4.7 x 6.0 cm = volume: 152 mL. Normal echogenicity. No hydronephrosis or shadowing stone. Bladder: The urinary bladder is decompressed around a Foley catheter. Other: None IMPRESSION: Unremarkable renal ultrasound. Electronically Signed   By: Anner Crete M.D.   On: 01/28/2019 19:50   Dg Chest Port 1 View  Result Date: 02/14/2019 CLINICAL DATA:  Dyspnea.  History of hypertension and CHF. EXAM: PORTABLE CHEST 1 VIEW COMPARISON:  02/10/2019. FINDINGS: Cardiac silhouette is mildly enlarged. No mediastinal or hilar masses. There is vascular congestion with mild interstitial thickening bilaterally, improved from the most recent prior study. Lung base opacities are also improved, but not resolved, consistent with a combination of atelectasis and small pleural  effusions. No pneumothorax. IMPRESSION: Improved, but not resolved, congestive heart failure. No new abnormalities. Electronically Signed   By: Lajean Manes M.D.   On: 02/14/2019 08:33   Dg Chest Port 1 View  Result Date: 02/10/2019 CLINICAL DATA:  Shortness of breath EXAM: PORTABLE CHEST 1 VIEW COMPARISON:  02/08/2019, 02/03/2019, 02/01/2019 FINDINGS: Bilateral interstitial and patchy alveolar airspace opacities. Trace bilateral pleural effusions. No pneumothorax. Stable cardiomegaly. No acute osseous abnormality. IMPRESSION: Findings most compatible with improving CHF. Electronically Signed   By: Kathreen Devoid   On: 02/10/2019 14:10   Xr Chest Portable  Result Date: 02/08/2019 CLINICAL DATA:  Shortness of breath EXAM: PORTABLE CHEST 1 VIEW COMPARISON:  February 03, 2019 FINDINGS: Heart size is enlarged. There are small to moderate-sized bilateral pleural effusions, right greater than left. There are diffuse hazy bilateral airspace opacities with areas of more focal consolidation/atelectasis at the lung bases. No pneumothorax. No acute osseous abnormality. IMPRESSION: Overall findings concerning for congestive heart failure and pulmonary edema as detailed above. Electronically Signed  By: Constance Holster M.D.   On: 02/08/2019 21:55   Dg Chest Port 1 View  Result Date: 01/29/2019 CLINICAL DATA:  Dyspnea, shortness of breath, CHF, diabetic EXAM: PORTABLE CHEST 1 VIEW COMPARISON:  Radiograph 01/23/2019 FINDINGS: Veil like density in the left lung base suggesting a layering pleural effusion on a background of hazy interstitial opacities and cephalized, indistinct pulmonary vascularity with central vascular cuffing and septal lines. Prominence the cardiac silhouette is similar to comparison exams. No visible pneumothorax. Degenerative changes are present in the imaged spine and shoulders. No acute osseous or soft tissue abnormality. IMPRESSION: 1. Findings consistent with mild to moderate CHF with  interstitial edema. Suspect a moderate layering pleural effusion on the left. 2. Unchanged prominent cardiac silhouette, which could reflect cardiomegaly or pericardial effusion. Electronically Signed   By: Lovena Le M.D.   On: 01/29/2019 17:35   Dg Chest Port 1 View  Result Date: 01/23/2019 CLINICAL DATA:  62 year old female with shortness of breath. On home oxygen. EXAM: PORTABLE CHEST 1 VIEW COMPARISON:  10/31/2018 and earlier. FINDINGS: Portable AP semi upright view at 1509 hours. Stable lung volumes and mediastinal contours. Visualized tracheal air column is within normal limits. Further increased bilateral interstitial and lung base opacity, with obscuration of the diaphragm now. No superimposed pneumothorax. No definite air bronchograms. Paucity bowel gas in the upper abdomen. No acute osseous abnormality identified. IMPRESSION: Increasing bilateral lung opacity favored due to acute pulmonary edema with lung base atelectasis and possibly small pleural effusions. Bilateral lower lobe pneumonia felt less likely. Electronically Signed   By: Genevie Ann M.D.   On: 01/23/2019 15:18   US Thoracentesis Asp Pleural Space W/img Guide  Result Date: 01/30/2019 INDICATION: Patient with history of CHF, chronic kidney disease, bilateral pleural effusions. Request received for diagnostic and therapeutic left thoracentesis. EXAM: ULTRASOUND GUIDED DIAGNOSTIC AND THERAPEUTIC LEFT THORACENTESIS MEDICATIONS: None COMPLICATIONS: None immediate. PROCEDURE: An ultrasound guided thoracentesis was thoroughly discussed with the patient and questions answered. The benefits, risks, alternatives and complications were also discussed. The patient understands and wishes to proceed with the procedure. Written consent was obtained. Ultrasound was performed to localize and mark an adequate pocket of fluid in the left chest. The area was then prepped and draped in the normal sterile fashion. 1% Lidocaine was used for local  anesthesia. Under ultrasound guidance a 6 Fr Safe-T-Centesis catheter was introduced. Thoracentesis was performed. The catheter was removed and a dressing applied. FINDINGS: A total of approximately 270 cc of light yellow fluid was removed. Samples were sent to the laboratory as requested by the clinical team. IMPRESSION: Successful ultrasound guided diagnostic and therapeutic left thoracentesis yielding 270 cc of pleural fluid. Read by: Rowe Robert, PA-C Electronically Signed   By: Jacqulynn Cadet M.D.   On: 01/30/2019 12:19    Microbiology: No results found for this or any previous visit (from the past 240 hour(s)).   Labs: Basic Metabolic Panel: Recent Labs  Lab 02/15/19 0333 02/16/19 0347 02/17/19 0324 02/18/19 0250 02/19/19 0256  NA 137 137 138 137 138  K 4.8 4.4 4.7 4.9 4.8  CL 97* 96* 99 98 97*  CO2 _0 GLUCOSE 121* 83 95 129* 105*  BUN 45* 44* 43* 40* 37*  CREATININE 2.37* 2.40* 2.32* 2.08* 1.95*  CALCIUM 8.9 9.2 9.2 9.3 9.5   Liver Function Tests: No results for input(s): AST, ALT, ALKPHOS, BILITOT, PROT, ALBUMIN in the last 168 hours. No results for input(s): LIPASE, AMYLASE in the  last 168 hours. No results for input(s): AMMONIA in the last 168 hours. CBC: Recent Labs  Lab 02/14/19 0333 02/15/19 0333 02/16/19 0347 02/17/19 0324 02/18/19 0250  WBC 3.7* 4.2 3.3* 3.8* 4.1  NEUTROABS 2.6 3.0 2.2 2.6 2.9  HGB 7.6* 7.5* 7.4* 7.7* 7.9*  HCT 25.3* 24.9* 24.3* 25.2* 25.9*  MCV 90.7 90.5 89.0 89.0 89.3  PLT 215 207 214 235 258   Cardiac Enzymes: No results for input(s): CKTOTAL, CKMB, CKMBINDEX, TROPONINI in the last 168 hours. BNP: BNP (last 3 results) Recent Labs    01/23/19 1343 02/08/19 2330 02/10/19 0700  BNP 423.4* 380.8* 379.3*    ProBNP (last 3 results) No results for input(s): PROBNP in the last 8760 hours.  CBG: Recent Labs  Lab 02/18/19 2024 02/18/19 2336 02/19/19 0427 02/19/19 0810 02/19/19 1145  GLUCAP 120* 117* 102* 107*  160*       Signed:  Alma Friendly, MD [facility] 02/19/2019, 2:31 PM

## 2019-02-19 NOTE — Progress Notes (Signed)
Progress Note  Patient Name: Kara Hanson Date of Encounter: 02/19/2019  Primary Cardiologist: Candee Furbish, MD   Subjective   63 year old female with a history of stress-induced cardiomyopathy, diabetes mellitus, hypertension and chronic renal insufficiency is admitted with recurrent congestive heart failure and mildly elevated troponin levels.  Ejection fraction is 30 to 35%.  Heart catheterization revealed nonobstructive coronary artery disease.  Left ventricular end-diastolic pressure was 26 at the time of heart catheterization.  Creatinine is down to 1.95.   Nephrology is managing her diuretics and fluids She is gradually getting better    Inpatient Medications    Scheduled Meds: . allopurinol  150 mg Oral Daily  . aspirin EC  81 mg Oral Daily  . atorvastatin  80 mg Oral q1800  . carvedilol  25 mg Oral BID WC  . DULoxetine  30 mg Oral Daily  . enoxaparin (LOVENOX) injection  30 mg Subcutaneous Q24H  . feeding supplement (GLUCERNA SHAKE)  237 mL Oral TID BM  . furosemide  80 mg Oral BID  . gabapentin  100 mg Oral BID  . insulin aspart  0-9 Units Subcutaneous Q4H  . isosorbide-hydrALAZINE  1 tablet Oral TID  . pantoprazole  20 mg Oral Daily  . sodium chloride flush  3 mL Intravenous Q12H   Continuous Infusions: . sodium chloride     PRN Meds: sodium chloride, acetaminophen **OR** acetaminophen, alum & mag hydroxide-simeth, bisacodyl, dextrose, ondansetron **OR** ondansetron (ZOFRAN) IV, polyethylene glycol, sodium chloride flush   Vital Signs    Vitals:   02/18/19 0639 02/18/19 1410 02/18/19 2026 02/19/19 0430  BP: (!) 143/61 (!) 119/58 (!) 132/56 (!) 131/58  Pulse: 71 75 67 80  Resp: 17     Temp: 97.8 F (36.6 C) 98.5 F (36.9 C) 98.3 F (36.8 C) 97.7 F (36.5 C)  TempSrc: Oral Oral Oral Oral  SpO2: 95% 100% 96% 97%  Weight: 72 kg   70.5 kg  Height:        Intake/Output Summary (Last 24 hours) at 02/19/2019 0953 Last data filed at 02/19/2019 0912  Gross per 24 hour  Intake 1013 ml  Output 1200 ml  Net -187 ml   Last 3 Weights 02/19/2019 02/18/2019 02/17/2019  Weight (lbs) 155 lb 8 oz 158 lb 12.8 oz 160 lb 3.2 oz  Weight (kg) 70.534 kg 72.031 kg 72.666 kg      Telemetry  NSR  - Personally Reviewed  ECG     nsr  - Personally Reviewed  Physical Exam   GEN: No acute distress.   Neck: No JVD Cardiac: RRR,  Soft systolic murmur  Respiratory: Clear to auscultation bilaterally. GI: Soft, nontender, non-distended  MS: No edema; No deformity. Neuro:  Nonfocal  Psych: Normal affect   Labs    High Sensitivity Troponin:   Recent Labs  Lab 01/23/19 2034 02/08/19 2330 02/09/19 0114 02/09/19 0615 02/10/19 0919  TROPONINIHS 13 25* 660* 3,166* 1,015*      Chemistry Recent Labs  Lab 02/17/19 0324 02/18/19 0250 02/19/19 0256  NA 138 137 138  K 4.7 4.9 4.8  CL 99 98 97*  CO2 29 30 31   GLUCOSE 95 129* 105*  BUN 43* 40* 37*  CREATININE 2.32* 2.08* 1.95*  CALCIUM 9.2 9.3 9.5  GFRNONAA 22* 25* 27*  GFRAA 25* 28* 31*  ANIONGAP 10 9 10      Hematology Recent Labs  Lab 02/16/19 0347 02/17/19 0324 02/18/19 0250  WBC 3.3* 3.8* 4.1  RBC 2.73* 2.83* 2.90*  HGB 7.4* 7.7* 7.9*  HCT 24.3* 25.2* 25.9*  MCV 89.0 89.0 89.3  MCH 27.1 27.2 27.2  MCHC 30.5 30.6 30.5  RDW 15.0 14.7 14.9  PLT 214 235 258    BNPNo results for input(s): BNP, PROBNP in the last 168 hours.   DDimer No results for input(s): DDIMER in the last 168 hours.   Radiology    No results found.  Cardiac Studies      Patient Profile     63 y.o. female   [place]    1.  Acute on chronic combined systolic and diastolic congestive heart failure: Patient is diuresed and is feeling better. Nephrology is managing her diuretics and her fluids.  Renal function is slowly improving   We will sign off Please let us know when she is to be discharged and we will arrange follow up with Dr. Marlou Porch / APP   [facility] will sign off.    Medication Recommendations:  Cont per nephrology recommendations  Other recommendations (labs, testing, etc):   Follow up as an outpatient:  With Dr. Marlou Porch / APP     For questions or updates, please contact [facility] Please consult [email or web address] for contact info under        Signed, Mertie Moores, MD  02/19/2019, 9:52 AM

## 2019-02-20 ENCOUNTER — Telehealth: Payer: Self-pay

## 2019-02-20 NOTE — Telephone Encounter (Signed)
Transition Care Management Follow-up Telephone Call Date of discharge and from where: 02/19/2019, [facility]   Attempted to contact patient # [phone], message left with CM call back number.

## 2019-02-21 ENCOUNTER — Telehealth: Payer: Self-pay

## 2019-02-21 NOTE — Telephone Encounter (Signed)
Transition Care Management Follow-up Telephone Call  Date of discharge and from where: 02/19/2019, [facility]   How have you been since you were released from the hospital? She said that she is finally starting to feel better but she remains weak. She also noted that she does not have any swelling and that her legs are really " skinny."   Any questions or concerns? No questions/concerns at this time.   Items Reviewed:  Did the pt receive and understand the discharge instructions provided? She has the instructions and does not have any questions.   Medications obtained?  She said that she has all medications and is taking them as ordered. List reviewed, questions answered regarding the use of some of the medications.  She said she is taking furosemide 40 mg twice daily instead of 80 mg twice daily as noted on the AVS.  She then noted that she has not yet received the 80 mg from her pharmacy.  She is aware that she could take 2 - 40 mg tablets twice daily in the meantime.  She does not have the feeding supplements.    Any new allergies since your discharge? None reported   Do you have support at home? She is staying with her daughter, [name].  Eventually she hopes to be able to get an apartment  of her own.   Other (ie: DME, Home Health, etc) referral made to [facility] at time of discharge.  She said that she needs to call the agency to schedule home visit.   Has O2 ( concentrator and POC)  from [place] and has been using it continuously @ 4L.  Has glucometer. Said her her blood sugars have been in the 150s.   Still does not have a scale.   Functional Questionnaire: (I = Independent and D = Dependent) ADL's:indpendent, her daughter helps when needed. Not using the walker or assistive device at this time  Her daughter has stairs in her home and this has been quite difficult for the patient. Her daughter or grandson assist her with the stairs   Follow up appointments reviewed:     PCP Hospital f/u appt confirmed? Scheduled her with Dr Margarita Rana 02/27/2019 @ [facility] f/u appt confirmed? Cardiology - 03/14/2019.  She is waiting to hear from nephrology.   Are transportation arrangements needed? Uses cab.  Son has provided transportation in the past.   If their condition worsens, is the pt aware to call  their PCP or go to the ED? yes  Was the patient provided with contact information for the PCP's office or ED? She has the clinic phone number  Was the pt encouraged to call back with questions or concerns? yes

## 2019-02-21 NOTE — Telephone Encounter (Signed)
From the discharge call.  She has an appt with Dr Margarita Rana 02/27/2019.  She said that she is finally starting to feel better but she remains weak. She also noted that she does not have any swelling and that her legs are really " skinny."     She said that she has all medications and is taking them as ordered. List reviewed, questions answered regarding the use of some of the medications.  She said she is taking furosemide 40 mg twice daily instead of 80 mg twice daily as noted on the AVS.  She then noted that she has not yet received the 80 mg from her pharmacy.  She is aware that she could take 2 - 40 mg tablets twice daily in the meantime.  She does not have the feeding supplements.  She said that she would speak to [place] about the furosemide dose.

## 2019-02-27 ENCOUNTER — Ambulatory Visit: Payer: Medicare Other | Attending: Family Medicine | Admitting: Family Medicine

## 2019-02-27 ENCOUNTER — Other Ambulatory Visit: Payer: Self-pay

## 2019-02-27 ENCOUNTER — Encounter: Payer: Self-pay | Admitting: Family Medicine

## 2019-02-27 ENCOUNTER — Telehealth: Payer: Self-pay

## 2019-02-27 VITALS — BP 124/61 | HR 79 | Temp 98.3°F | Ht 65.0 in | Wt 155.6 lb

## 2019-02-27 DIAGNOSIS — I252 Old myocardial infarction: Secondary | ICD-10-CM | POA: Diagnosis not present

## 2019-02-27 DIAGNOSIS — E11649 Type 2 diabetes mellitus with hypoglycemia without coma: Secondary | ICD-10-CM | POA: Diagnosis not present

## 2019-02-27 DIAGNOSIS — Z91040 Latex allergy status: Secondary | ICD-10-CM | POA: Insufficient documentation

## 2019-02-27 DIAGNOSIS — Z79899 Other long term (current) drug therapy: Secondary | ICD-10-CM | POA: Insufficient documentation

## 2019-02-27 DIAGNOSIS — Z91018 Allergy to other foods: Secondary | ICD-10-CM | POA: Diagnosis not present

## 2019-02-27 DIAGNOSIS — E7800 Pure hypercholesterolemia, unspecified: Secondary | ICD-10-CM | POA: Insufficient documentation

## 2019-02-27 DIAGNOSIS — Z91048 Other nonmedicinal substance allergy status: Secondary | ICD-10-CM | POA: Diagnosis not present

## 2019-02-27 DIAGNOSIS — E1140 Type 2 diabetes mellitus with diabetic neuropathy, unspecified: Secondary | ICD-10-CM | POA: Diagnosis not present

## 2019-02-27 DIAGNOSIS — I10 Essential (primary) hypertension: Secondary | ICD-10-CM | POA: Diagnosis not present

## 2019-02-27 DIAGNOSIS — I130 Hypertensive heart and chronic kidney disease with heart failure and stage 1 through stage 4 chronic kidney disease, or unspecified chronic kidney disease: Secondary | ICD-10-CM | POA: Diagnosis not present

## 2019-02-27 DIAGNOSIS — Z823 Family history of stroke: Secondary | ICD-10-CM | POA: Diagnosis not present

## 2019-02-27 DIAGNOSIS — R251 Tremor, unspecified: Secondary | ICD-10-CM | POA: Diagnosis not present

## 2019-02-27 DIAGNOSIS — E1159 Type 2 diabetes mellitus with other circulatory complications: Secondary | ICD-10-CM | POA: Insufficient documentation

## 2019-02-27 DIAGNOSIS — Z9049 Acquired absence of other specified parts of digestive tract: Secondary | ICD-10-CM | POA: Diagnosis not present

## 2019-02-27 DIAGNOSIS — E1122 Type 2 diabetes mellitus with diabetic chronic kidney disease: Secondary | ICD-10-CM

## 2019-02-27 DIAGNOSIS — Z833 Family history of diabetes mellitus: Secondary | ICD-10-CM | POA: Insufficient documentation

## 2019-02-27 DIAGNOSIS — E1121 Type 2 diabetes mellitus with diabetic nephropathy: Secondary | ICD-10-CM

## 2019-02-27 DIAGNOSIS — J9611 Chronic respiratory failure with hypoxia: Secondary | ICD-10-CM | POA: Diagnosis not present

## 2019-02-27 DIAGNOSIS — Z794 Long term (current) use of insulin: Secondary | ICD-10-CM | POA: Diagnosis not present

## 2019-02-27 DIAGNOSIS — Z8673 Personal history of transient ischemic attack (TIA), and cerebral infarction without residual deficits: Secondary | ICD-10-CM | POA: Insufficient documentation

## 2019-02-27 DIAGNOSIS — I5022 Chronic systolic (congestive) heart failure: Secondary | ICD-10-CM

## 2019-02-27 DIAGNOSIS — N1832 Chronic kidney disease, stage 3b: Secondary | ICD-10-CM

## 2019-02-27 DIAGNOSIS — Z885 Allergy status to narcotic agent status: Secondary | ICD-10-CM | POA: Insufficient documentation

## 2019-02-27 DIAGNOSIS — Z7982 Long term (current) use of aspirin: Secondary | ICD-10-CM | POA: Diagnosis not present

## 2019-02-27 LAB — GLUCOSE, POCT (MANUAL RESULT ENTRY): POC Glucose: 234 mg/dl — AB (ref 70–99)

## 2019-02-27 MED ORDER — CARVEDILOL 25 MG PO TABS: 25.0000 mg | ORAL_TABLET | Freq: Two times a day (BID) | ORAL | 3 refills | Status: DC

## 2019-02-27 MED ORDER — INSULIN ASPART 100 UNIT/ML ~~LOC~~ SOLN: SUBCUTANEOUS | 3 refills | Status: DC

## 2019-02-27 MED ORDER — ATORVASTATIN CALCIUM 80 MG PO TABS: 80.0000 mg | ORAL_TABLET | Freq: Every day | ORAL | 3 refills | Status: DC

## 2019-02-27 MED ORDER — BASAGLAR KWIKPEN 100 UNIT/ML ~~LOC~~ SOPN: 35.0000 [IU] | PEN_INJECTOR | Freq: Every day | SUBCUTANEOUS | 3 refills | Status: DC

## 2019-02-27 MED ORDER — GABAPENTIN 100 MG PO CAPS: 100.0000 mg | ORAL_CAPSULE | Freq: Two times a day (BID) | ORAL | 3 refills | Status: DC

## 2019-02-27 MED ORDER — ALLOPURINOL 300 MG PO TABS: 150.0000 mg | ORAL_TABLET | Freq: Every day | ORAL | 3 refills | Status: DC

## 2019-02-27 MED ORDER — FUROSEMIDE 80 MG PO TABS: 80.0000 mg | ORAL_TABLET | Freq: Two times a day (BID) | ORAL | 3 refills | Status: DC

## 2019-02-27 NOTE — Telephone Encounter (Signed)
Met with the patient when she was in the clinic today. She explained that there is a problem with the cord on her POC.  Her daughter called [facility] but has not heard back.patient also noted that her walker and wheelchair are at her son's house and she needs to get them back as she has been somewhat unsteady when walking.  She also said that she has not received all of her discharge medications from [facility].  Patient's current address updated in Epic.   This CM placed call to [place], spoke to [place] and explained issue with POC cord. Request placed to have a representative contact patient's daughter, [name] who's phone number was provided.   Call placed to [place], spoke to Christy Sartorius, [facility] and explained that patient stated she has not received her post discharge medications. He said that they have tried to deliver twice and no one was home. He just spoke to [place] who said they are home and he will plan to deliver today. He also noted that he will deliver the new prescriptions from Dr Margarita Rana that he received this morning

## 2019-02-27 NOTE — Patient Instructions (Signed)
Tremor A tremor is trembling or shaking that you cannot control. Most tremors affect the hands or arms. Tremors can also affect the head, vocal cords, face, and other parts of the body. There are many types of tremors. Common types include:  Essential tremor. These usually occur in people older than 40. It may run in families and can happen in otherwise healthy people.  Resting tremor. These occur when the muscles are at rest, such as when your hands are resting in your lap. People with Parkinson's disease often have resting tremors.  Postural tremor. These occur when you try to hold a pose, such as keeping your hands outstretched.  Kinetic tremor. These occur during purposeful movement, such as trying to touch a finger to your nose.  Task-specific tremor. These may occur when you perform certain tasks such as writing, speaking, or standing.  Psychogenic tremor. These dramatically lessen or disappear when you are distracted. They can happen in people of all ages. Some types of tremors have no known cause. Tremors can also be a symptom of nervous system problems (neurological disorders) that may occur with aging. Some tremors go away with treatment, while others do not. Follow these instructions at home: Lifestyle      Limit alcohol intake to no more than 1 drink a day for nonpregnant women and 2 drinks a day for men. One drink equals 12 oz of beer, 5 oz of wine, or 1 oz of hard liquor.  Do not use any products that contain nicotine or tobacco, such as cigarettes and e-cigarettes. If you need help quitting, ask your health care provider.  Avoid extreme heat and extreme cold.  Limit your caffeine intake, as told by your health care provider.  Try to get 8 hours of sleep each night.  Find ways to manage your stress, such as meditation or yoga. General instructions  Take over-the-counter and prescription medicines only as told by your health care provider.  Keep all follow-up visits  as told by your health care provider. This is important. Contact a health care provider if you:  Develop a tremor after starting a new medicine.  Have a tremor along with other symptoms such as: ? Numbness. ? Tingling. ? Pain. ? Weakness.  Notice that your tremor gets worse.  Notice that your tremor interferes with your day-to-day life. Summary  A tremor is trembling or shaking that you cannot control.  Most tremors affect the hands or arms.  Some types of tremors have no known cause. Others may be a symptom of nervous system problems (neurological disorders).  Make sure you discuss any tremors you have with your health care provider. This information is not intended to replace advice given to you by your health care provider. Make sure you discuss any questions you have with your health care provider. Document Released: 03/11/2002 Document Revised: 03/03/2017 Document Reviewed: 01/19/2017 Elsevier Patient Education  2020 [employer]  

## 2019-02-27 NOTE — Progress Notes (Signed)
Subjective:  Patient ID: Kara Hanson, female    DOB: 1955-12-03  Age: 63 y.o. MRN: [id number]  CC: Congestive Heart Failure   HPI Kara Hanson is a 63 year old female with a history of type 2 diabetes mellitus (A1c 7.8), hypertension, congestive heart failure (EF 50% from echo of 10/2018, COVID-19 related pneumonia in 07/2018 seen for a follow up visit after hospitalization at [facility] from 02/08/19 - 02/19/19 for  CHF exacerbation. She required BiPAP, IV diuretics, COVID-19 was negative.Troponins were significantly elevated Cardiology evaluated for possible NSTEMI with right and left cardiac cath revealing:  Prox RCA lesion is 30% stenosed.  Prox Cx lesion is 40% stenosed with 60% stenosed side branch in 1st Mrg.  Mid LAD lesion is 30% stenosed.  Dist LAD lesion is 30% stenosed.   Plan was to continue medical therapy.She did have acute on chronic kidney injury for which Nephrology was following .Her Losartan was discontinued during hospitalization. Also had periods of hypoglycemia which she attributes to a poor appetite EGD performed as work up of anemia revealed gastritis with hemorrhage. Pathology was negative for metaplasia or H.pylori  Today she complains going up the stairs is difficult for her but going down the stairs is not problematic. She still has weakness in her legs and is yet to commence home PT. Her walker is at her son's house and she has no ambulatory assistive device. Denies recent falls. Her oxygen saturation is 87% and she denies being dyspneic; she does not have her portable oxygen with her due to a problem with the tubing but she has been using 4L at home. Appointment with Cardiology is on 03/14/19 and Nephrology on 03/20/19.  Blood sugars at home get down to 80 when she is yet to eat and highest is 190. Currently administering 35 units of Basaglar with no hypoglycemic episodes.  Past Medical History:  Diagnosis Date  . CHF (congestive heart  failure) ([place])   . CVA (cerebral vascular accident) ([place])   . Diabetes mellitus without complication ([place])   . Hypercholesteremia   . Hypertension   . Myocardial infarction ([place])   . Pneumonia 11/01/2018  . Spinal stenosis     Past Surgical History:  Procedure Laterality Date  . BIOPSY  01/27/2019   Procedure: BIOPSY;  Surgeon: Otis Brace, MD;  Location: [facility];  Service: Gastroenterology;;  . BLADDER SURGERY    . CARDIAC CATHETERIZATION N/A 04/25/2016   Procedure: Left Heart Cath and Coronary Angiography;  Surgeon: Lorretta Harp, MD;  Location: [place] [facility];  Service: Cardiovascular;  Laterality: N/A;  . CARDIAC CATHETERIZATION  02/11/2019  . CESAREAN SECTION    . CHOLECYSTECTOMY    . ESOPHAGOGASTRODUODENOSCOPY (EGD) WITH PROPOFOL N/A 01/27/2019   Procedure: ESOPHAGOGASTRODUODENOSCOPY (EGD) WITH PROPOFOL;  Surgeon: Otis Brace, MD;  Location: [facility];  Service: Gastroenterology;  Laterality: N/A;  . RIGHT/LEFT HEART CATH AND CORONARY ANGIOGRAPHY N/A 02/11/2019   Procedure: RIGHT/LEFT HEART CATH AND CORONARY ANGIOGRAPHY;  Surgeon: Jolaine Artist, MD;  Location: [place] [facility];  Service: Cardiovascular;  Laterality: N/A;  . VIDEO BRONCHOSCOPY N/A 02/01/2019   Procedure: VIDEO BRONCHOSCOPY WITHOUT FLUORO;  Surgeon: Candee Furbish, MD;  Location: [facility];  Service: Endoscopy;  Laterality: N/A;    Family History  Problem Relation Age of Onset  . Diabetes Mellitus II Father   . Stroke Father   . Healthy Mother        She is 63 years old.     Allergies  Allergen Reactions  . Garlic Shortness Of Breath, Itching and Swelling    Hand itching and swelling  . Latex Itching  . Morphine And Related Itching and Other (See Comments)    Headache   . Other Itching    Reaction to newspaper ink -itching and headache    Outpatient Medications Prior to Visit  Medication Sig Dispense Refill  . Accu-Chek FastClix Lancets MISC USE AS DIRECTED  TO TEST BLOOD SUGAR THREE TIMES DAILY 102 each 12  . allopurinol (ZYLOPRIM) 300 MG tablet Take 0.5 tablets (150 mg total) by mouth daily. 15 tablet 1  . Amino Acids-Protein Hydrolys (FEEDING SUPPLEMENT, PRO-STAT SUGAR FREE 64,) LIQD Take 30 mLs by mouth 2 (two) times daily. 887 mL 0  . aspirin EC 81 MG tablet Take 81 mg by mouth daily.    Marland Kitchen atorvastatin (LIPITOR) 80 MG tablet Take 1 tablet (80 mg total) by mouth daily at 6 PM. 30 tablet 0  . bisacodyl (DULCOLAX) 5 MG EC tablet Take 2 tablets (10 mg total) by mouth daily as needed for moderate constipation. 30 tablet 0  . Blood Glucose Monitoring Suppl (ACCU-CHEK GUIDE) w/Device KIT 1 each by Does not apply route 3 (three) times daily. 1 kit 0  . carvedilol (COREG) 25 MG tablet Take 1 tablet (25 mg total) by mouth 2 (two) times daily with a meal. 60 tablet 1  . DULoxetine (CYMBALTA) 30 MG capsule Take 1 capsule (30 mg total) by mouth daily. 30 capsule 3  . EASY COMFORT PEN NEEDLES 31G X 5 MM MISC USE FOUR TIMES PER DAY FOR INSULIN ADMINISTRATION (Patient taking differently: 4 (four) times daily. ) 200 each 12  . ergocalciferol (DRISDOL) 1.25 MG (50000 UT) capsule Take 1 capsule (50,000 Units total) by mouth once a week for 4 doses. (Patient taking differently: Take 50,000 Units by mouth every Sunday. ) 9 capsule 1  . furosemide (LASIX) 80 MG tablet Take 1 tablet (80 mg total) by mouth 2 (two) times daily. 60 tablet 0  . gabapentin (NEURONTIN) 100 MG capsule Take 1 capsule (100 mg total) by mouth 2 (two) times daily. 60 capsule 0  . glucose blood (ACCU-CHEK GUIDE) test strip USE AS DIRECTED TO TEST BLOOD SUGAR THREE TIMES DAILY 100 each 12  . insulin aspart (NOVOLOG) 100 UNIT/ML injection 0 to 12 units subcutaneously 3 times daily before meals as per sliding scale 30 mL 6  . Insulin Glargine (BASAGLAR KWIKPEN) 100 UNIT/ML SOPN Inject 0.05 mLs (5 Units total) into the skin at bedtime. (Patient taking differently: Inject 35 Units into the skin at bedtime.  ) 30 mL 6  . Lancet Device MISC Use as instructed 3 times daily 1 each 0  . lansoprazole (PREVACID) 15 MG capsule Take 15 mg by mouth daily.     . metoCLOPramide (REGLAN) 5 MG tablet Take 1 tablet (5 mg total) by mouth 3 (three) times daily. 90 tablet 1  . Misc. Devices MISC Portable oxygen concentrator.  Diagnosis-chronic respiratory failure. 1 each 0  . Misc. Devices MISC [place] with seat. Dx: Congestive Heart Failure 1 each 0  . polyethylene glycol (MIRALAX / GLYCOLAX) 17 g packet Take 17 g by mouth daily. (Patient taking differently: Take 17 g by mouth daily as needed for moderate constipation. ) 14 each 0  . sodium chloride (OCEAN) 0.65 % SOLN nasal spray Place 1 spray into both nostrils as needed for congestion.    . SUPER B COMPLEX/C CAPS Take 1 capsule by mouth daily.    Marland Kitchen  feeding supplement, ENSURE ENLIVE, (ENSURE ENLIVE) LIQD Take 237 mLs by mouth 3 (three) times daily between meals. (Patient not taking: Reported on 02/27/2019) 237 mL 12  . isosorbide-hydrALAZINE (BIDIL) 20-37.5 MG tablet Take 1 tablet by mouth 3 (three) times daily. (Patient not taking: Reported on 02/27/2019) 90 tablet 1   No facility-administered medications prior to visit.      ROS Review of Systems  Constitutional: Negative for activity change, appetite change and fatigue.  HENT: Negative for congestion, sinus pressure and sore throat.   Eyes: Negative for visual disturbance.  Respiratory: Negative for cough, chest tightness, shortness of breath and wheezing.   Cardiovascular: Negative for chest pain and palpitations.  Gastrointestinal: Negative for abdominal distention, abdominal pain and constipation.  Endocrine: Negative for polydipsia.  Genitourinary: Negative for dysuria and frequency.  Musculoskeletal: Positive for gait problem. Negative for arthralgias and back pain.  Skin: Negative for rash.  Neurological: Positive for tremors and weakness. Negative for light-headedness and numbness.   Hematological: Does not bruise/bleed easily.  Psychiatric/Behavioral: Negative for agitation and behavioral problems.    Objective:  BP 124/61   Pulse 79   Temp 98.3 F (36.8 C) (Oral)   Ht _0  (1.651 m)   Wt 155 lb 9.6 oz (70.6 kg)   SpO2 (!) 87%   BMI 25.89 kg/m   BP/Weight 02/27/2019 02/19/2019 62/0/3559  Systolic BP 741 638 -  Diastolic BP 61 53 -  Wt. (Lbs) 155.6 155.5 -  BMI 25.89 - 25.88      Physical Exam Constitutional:      Appearance: She is well-developed.  Neck:     Vascular: No JVD.  Cardiovascular:     Rate and Rhythm: Normal rate.     Heart sounds: Normal heart sounds. No murmur.  Pulmonary:     Effort: Pulmonary effort is normal.     Breath sounds: Normal breath sounds. No wheezing or rales.  Chest:     Chest wall: No tenderness.  Abdominal:     General: Bowel sounds are normal. There is no distension.     Palpations: Abdomen is soft. There is no mass.     Tenderness: There is no abdominal tenderness.  Musculoskeletal: Normal range of motion.     Right lower leg: No edema.     Left lower leg: No edema.  Neurological:     Mental Status: She is alert and oriented to person, place, and time.     Coordination: Coordination abnormal (presence of tremors on ontension and at rest).     Gait: Gait abnormal.  Psychiatric:        Mood and Affect: Mood normal.     CMP Latest Ref Rng & Units 02/19/2019 02/18/2019 02/17/2019  Glucose 70 - 99 mg/dL 105(H) 129(H) 95  BUN 8 - 23 mg/dL 37(H) 40(H) 43(H)  Creatinine 0.44 - 1.00 mg/dL 1.95(H) 2.08(H) 2.32(H)  Sodium 135 - 145 mmol/L 138 137 138  Potassium 3.5 - 5.1 mmol/L 4.8 4.9 4.7  Chloride 98 - 111 mmol/L 97(L) 98 99  CO2 22 - 32 mmol/L _1 Calcium 8.9 - 10.3 mg/dL 9.5 9.3 9.2  Total Protein 6.5 - 8.1 g/dL - - -  Total Bilirubin 0.3 - 1.2 mg/dL - - -  Alkaline Phos 38 - 126 U/L - - -  AST 15 - 41 U/L - - -  ALT 0 - 44 U/L - - -    Lipid Panel     Component Value Date/Time  CHOL 353  (H) 01/09/2019 0959   TRIG 108 02/01/2019 1717   HDL 52 01/09/2019 0959   CHOLHDL 6.8 (H) 01/09/2019 0959   CHOLHDL 6.6 03/24/2018 0551   VLDL 35 03/24/2018 0551   LDLCALC 251 (H) 01/09/2019 0959    CBC    Component Value Date/Time   WBC 4.1 02/18/2019 0250   RBC 2.90 (L) 02/18/2019 0250   HGB 7.9 (L) 02/18/2019 0250   HGB 11.0 (L) 09/17/2018 1456   HCT 25.9 (L) 02/18/2019 0250   HCT 32.5 (L) 09/17/2018 1456   PLT 258 02/18/2019 0250   PLT 313 09/17/2018 1456   MCV 89.3 02/18/2019 0250   MCV 85 09/17/2018 1456   MCH 27.2 02/18/2019 0250   MCHC 30.5 02/18/2019 0250   RDW 14.9 02/18/2019 0250   RDW 14.4 09/17/2018 1456   LYMPHSABS 0.7 02/18/2019 0250   LYMPHSABS 0.9 09/17/2018 1456   MONOABS 0.4 02/18/2019 0250   EOSABS 0.1 02/18/2019 0250   EOSABS 0.1 09/17/2018 1456   BASOSABS 0.0 02/18/2019 0250   BASOSABS 0.0 09/17/2018 1456    Lab Results  Component Value Date   HGBA1C 7.8 (H) 01/09/2019    Assessment & Plan:   1. Type 2 diabetes mellitus with diabetic neuropathy, with long-term current use of insulin (HCC) Controlled with A1c of 7.8; goal is <8.0 due to multiple co-morbidities Continue current regimen, diabetic diet, lifestyle modifications - Glucose (CBG) - atorvastatin (LIPITOR) 80 MG tablet; Take 1 tablet (80 mg total) by mouth daily at 6 PM.  Dispense: 30 tablet; Refill: 3 - insulin aspart (NOVOLOG) 100 UNIT/ML injection; 0 to 12 units subcutaneously 3 times daily before meals as per sliding scale  Dispense: 30 mL; Refill: 3 - Insulin Glargine (BASAGLAR KWIKPEN) 100 UNIT/ML SOPN; Inject 0.35 mLs (35 Units total) into the skin at bedtime.  Dispense: 30 mL; Refill: 3  2. Tremor Unknown etiology - TSH - T4, free - Ambulatory referral to Neurology  3. Essential hypertension Controlled Counseled on blood pressure goal of less than 130/80, low-sodium, DASH diet, medication compliance, 150 minutes of moderate intensity exercise per week. Discussed  medication compliance, adverse effects. - carvedilol (COREG) 25 MG tablet; Take 1 tablet (25 mg total) by mouth 2 (two) times daily with a meal.  Dispense: 60 tablet; Refill: 3 - furosemide (LASIX) 80 MG tablet; Take 1 tablet (80 mg total) by mouth 2 (two) times daily.  Dispense: 60 tablet; Refill: 3  4. Chronic systolic congestive heart failure (HCC) EF 30-35% from echo of 02/2019 Euvolemic Continue Lasix Keep appointment with Cardiology  5. Type 2 diabetes mellitus with other circulatory complication, with long-term current use of insulin (HCC) See #1 above - Insulin Glargine (BASAGLAR KWIKPEN) 100 UNIT/ML SOPN; Inject 0.35 mLs (35 Units total) into the skin at bedtime.  Dispense: 30 mL; Refill: 3  6. Type 2 diabetes mellitus with stage 3b chronic kidney disease, with long-term current use of insulin (HCC) Combination of hypertensive and diabetic nephropathy Managed by Nephrology   7. Chronic respiratory failure with hypoxia (HCC) Oxygen saturation is 87% on RA and she is not in any distress - she does have a baseline low oxygen saturation. Case manager called in to assist with obtaining portable oxygen      [name], MD, FAAFP. [facility], [place]   02/27/2019, 11:11 AM

## 2019-02-28 LAB — T4, FREE: Free T4: 0.98 ng/dL (ref 0.82–1.77)

## 2019-02-28 LAB — TSH: TSH: 3.48 u[IU]/mL (ref 0.450–4.500)

## 2019-03-04 ENCOUNTER — Telehealth: Payer: Self-pay

## 2019-03-04 NOTE — Telephone Encounter (Signed)
Patient name and DOB has been verified Patient was informed of lab results. Patient had no questions.  

## 2019-03-04 NOTE — Telephone Encounter (Signed)
-----   Message from [name], MD sent at 03/02/2019  3:56 PM EST ----- Thyroid labs are normal

## 2019-03-14 ENCOUNTER — Ambulatory Visit: Payer: Medicare Other | Admitting: Cardiology

## 2019-03-19 ENCOUNTER — Ambulatory Visit: Payer: Medicare Other | Admitting: Neurology

## 2019-03-19 ENCOUNTER — Encounter: Payer: Self-pay | Admitting: Neurology

## 2019-03-28 ENCOUNTER — Ambulatory Visit: Payer: Medicare Other | Admitting: Cardiology

## 2019-04-17 ENCOUNTER — Other Ambulatory Visit: Payer: Self-pay

## 2019-04-17 ENCOUNTER — Inpatient Hospital Stay (HOSPITAL_COMMUNITY)
Admission: EM | Admit: 2019-04-17 | Discharge: 2019-04-20 | DRG: 291 | Disposition: A | Discharge status: Home / Self Care | Payer: Medicare Other | Attending: Internal Medicine | Admitting: Internal Medicine

## 2019-04-17 ENCOUNTER — Encounter (HOSPITAL_COMMUNITY): Payer: Self-pay | Admitting: Emergency Medicine

## 2019-04-17 ENCOUNTER — Emergency Department (HOSPITAL_COMMUNITY): Payer: Medicare Other

## 2019-04-17 DIAGNOSIS — N1832 Chronic kidney disease, stage 3b: Secondary | ICD-10-CM | POA: Diagnosis not present

## 2019-04-17 DIAGNOSIS — I509 Heart failure, unspecified: Secondary | ICD-10-CM

## 2019-04-17 DIAGNOSIS — I130 Hypertensive heart and chronic kidney disease with heart failure and stage 1 through stage 4 chronic kidney disease, or unspecified chronic kidney disease: Principal | ICD-10-CM | POA: Diagnosis present

## 2019-04-17 DIAGNOSIS — Z9981 Dependence on supplemental oxygen: Secondary | ICD-10-CM

## 2019-04-17 DIAGNOSIS — E1122 Type 2 diabetes mellitus with diabetic chronic kidney disease: Secondary | ICD-10-CM | POA: Diagnosis present

## 2019-04-17 DIAGNOSIS — I2510 Atherosclerotic heart disease of native coronary artery without angina pectoris: Secondary | ICD-10-CM | POA: Diagnosis present

## 2019-04-17 DIAGNOSIS — Z8616 Personal history of COVID-19: Secondary | ICD-10-CM

## 2019-04-17 DIAGNOSIS — R600 Localized edema: Secondary | ICD-10-CM | POA: Diagnosis not present

## 2019-04-17 DIAGNOSIS — Z9114 Patient's other noncompliance with medication regimen: Secondary | ICD-10-CM

## 2019-04-17 DIAGNOSIS — J810 Acute pulmonary edema: Secondary | ICD-10-CM

## 2019-04-17 DIAGNOSIS — Z20822 Contact with and (suspected) exposure to covid-19: Secondary | ICD-10-CM | POA: Diagnosis not present

## 2019-04-17 DIAGNOSIS — N183 Chronic kidney disease, stage 3 unspecified: Secondary | ICD-10-CM | POA: Diagnosis present

## 2019-04-17 DIAGNOSIS — I071 Rheumatic tricuspid insufficiency: Secondary | ICD-10-CM | POA: Diagnosis present

## 2019-04-17 DIAGNOSIS — Z79899 Other long term (current) drug therapy: Secondary | ICD-10-CM

## 2019-04-17 DIAGNOSIS — J9621 Acute and chronic respiratory failure with hypoxia: Secondary | ICD-10-CM | POA: Diagnosis not present

## 2019-04-17 DIAGNOSIS — I350 Nonrheumatic aortic (valve) stenosis: Secondary | ICD-10-CM | POA: Diagnosis present

## 2019-04-17 DIAGNOSIS — Z8673 Personal history of transient ischemic attack (TIA), and cerebral infarction without residual deficits: Secondary | ICD-10-CM

## 2019-04-17 DIAGNOSIS — D631 Anemia in chronic kidney disease: Secondary | ICD-10-CM | POA: Diagnosis present

## 2019-04-17 DIAGNOSIS — I129 Hypertensive chronic kidney disease with stage 1 through stage 4 chronic kidney disease, or unspecified chronic kidney disease: Secondary | ICD-10-CM | POA: Diagnosis not present

## 2019-04-17 DIAGNOSIS — I252 Old myocardial infarction: Secondary | ICD-10-CM

## 2019-04-17 DIAGNOSIS — E785 Hyperlipidemia, unspecified: Secondary | ICD-10-CM | POA: Diagnosis present

## 2019-04-17 DIAGNOSIS — I5043 Acute on chronic combined systolic (congestive) and diastolic (congestive) heart failure: Secondary | ICD-10-CM | POA: Diagnosis not present

## 2019-04-17 DIAGNOSIS — R778 Other specified abnormalities of plasma proteins: Secondary | ICD-10-CM

## 2019-04-17 DIAGNOSIS — I428 Other cardiomyopathies: Secondary | ICD-10-CM | POA: Diagnosis present

## 2019-04-17 DIAGNOSIS — M109 Gout, unspecified: Secondary | ICD-10-CM | POA: Diagnosis present

## 2019-04-17 DIAGNOSIS — Z794 Long term (current) use of insulin: Secondary | ICD-10-CM

## 2019-04-17 DIAGNOSIS — R0602 Shortness of breath: Secondary | ICD-10-CM | POA: Diagnosis not present

## 2019-04-17 DIAGNOSIS — E1140 Type 2 diabetes mellitus with diabetic neuropathy, unspecified: Secondary | ICD-10-CM | POA: Diagnosis present

## 2019-04-17 DIAGNOSIS — I639 Cerebral infarction, unspecified: Secondary | ICD-10-CM | POA: Diagnosis not present

## 2019-04-17 LAB — CBC
HCT: 30.5 % — ABNORMAL LOW (ref 36.0–46.0)
Hemoglobin: 9.2 g/dL — ABNORMAL LOW (ref 12.0–15.0)
MCH: 27.5 pg (ref 26.0–34.0)
MCHC: 30.2 g/dL (ref 30.0–36.0)
MCV: 91 fL (ref 80.0–100.0)
Platelets: 267 10*3/uL (ref 150–400)
RBC: 3.35 MIL/uL — ABNORMAL LOW (ref 3.87–5.11)
RDW: 15.8 % — ABNORMAL HIGH (ref 11.5–15.5)
WBC: 5.3 10*3/uL (ref 4.0–10.5)
nRBC: 0 % (ref 0.0–0.2)

## 2019-04-17 LAB — BASIC METABOLIC PANEL
Anion gap: 11 (ref 5–15)
BUN: 41 mg/dL — ABNORMAL HIGH (ref 8–23)
CO2: 26 mmol/L (ref 22–32)
Calcium: 9.5 mg/dL (ref 8.9–10.3)
Chloride: 103 mmol/L (ref 98–111)
Creatinine, Ser: 1.69 mg/dL — ABNORMAL HIGH (ref 0.44–1.00)
GFR calc Af Amer: 37 mL/min — ABNORMAL LOW (ref >60)
GFR calc non Af Amer: 32 mL/min — ABNORMAL LOW (ref >60)
Glucose, Bld: 184 mg/dL — ABNORMAL HIGH (ref 70–99)
Potassium: 3.9 mmol/L (ref 3.5–5.1)
Sodium: 140 mmol/L (ref 135–145)

## 2019-04-17 LAB — BRAIN NATRIURETIC PEPTIDE: B Natriuretic Peptide: 592.8 pg/mL — ABNORMAL HIGH (ref 0.0–100.0)

## 2019-04-17 NOTE — ED Notes (Signed)
Switched out pt o2 tank with fresh

## 2019-04-17 NOTE — ED Triage Notes (Signed)
Pt reports her PCP sent her due to fluid in her lungs. Pt wears 4L [place] at baseline. Reports increase in lasix today. Endorses SOB.

## 2019-04-18 ENCOUNTER — Encounter (HOSPITAL_COMMUNITY): Payer: Self-pay | Admitting: Internal Medicine

## 2019-04-18 DIAGNOSIS — Z9981 Dependence on supplemental oxygen: Secondary | ICD-10-CM | POA: Diagnosis not present

## 2019-04-18 DIAGNOSIS — Z9114 Patient's other noncompliance with medication regimen: Secondary | ICD-10-CM | POA: Diagnosis not present

## 2019-04-18 DIAGNOSIS — N1832 Chronic kidney disease, stage 3b: Secondary | ICD-10-CM | POA: Diagnosis present

## 2019-04-18 DIAGNOSIS — Z8616 Personal history of COVID-19: Secondary | ICD-10-CM | POA: Diagnosis not present

## 2019-04-18 DIAGNOSIS — R7989 Other specified abnormal findings of blood chemistry: Secondary | ICD-10-CM

## 2019-04-18 DIAGNOSIS — E1140 Type 2 diabetes mellitus with diabetic neuropathy, unspecified: Secondary | ICD-10-CM | POA: Diagnosis present

## 2019-04-18 DIAGNOSIS — R778 Other specified abnormalities of plasma proteins: Secondary | ICD-10-CM | POA: Diagnosis not present

## 2019-04-18 DIAGNOSIS — I428 Other cardiomyopathies: Secondary | ICD-10-CM | POA: Diagnosis present

## 2019-04-18 DIAGNOSIS — R600 Localized edema: Secondary | ICD-10-CM | POA: Diagnosis not present

## 2019-04-18 DIAGNOSIS — J810 Acute pulmonary edema: Secondary | ICD-10-CM | POA: Diagnosis not present

## 2019-04-18 DIAGNOSIS — I361 Nonrheumatic tricuspid (valve) insufficiency: Secondary | ICD-10-CM | POA: Diagnosis not present

## 2019-04-18 DIAGNOSIS — M1A00X0 Idiopathic chronic gout, unspecified site, without tophus (tophi): Secondary | ICD-10-CM | POA: Diagnosis not present

## 2019-04-18 DIAGNOSIS — I5043 Acute on chronic combined systolic (congestive) and diastolic (congestive) heart failure: Secondary | ICD-10-CM | POA: Diagnosis present

## 2019-04-18 DIAGNOSIS — I350 Nonrheumatic aortic (valve) stenosis: Secondary | ICD-10-CM | POA: Diagnosis not present

## 2019-04-18 DIAGNOSIS — N183 Chronic kidney disease, stage 3 unspecified: Secondary | ICD-10-CM

## 2019-04-18 DIAGNOSIS — Z79899 Other long term (current) drug therapy: Secondary | ICD-10-CM | POA: Diagnosis not present

## 2019-04-18 DIAGNOSIS — I2510 Atherosclerotic heart disease of native coronary artery without angina pectoris: Secondary | ICD-10-CM | POA: Diagnosis present

## 2019-04-18 DIAGNOSIS — Z794 Long term (current) use of insulin: Secondary | ICD-10-CM | POA: Diagnosis not present

## 2019-04-18 DIAGNOSIS — Z20822 Contact with and (suspected) exposure to covid-19: Secondary | ICD-10-CM | POA: Diagnosis present

## 2019-04-18 DIAGNOSIS — E8770 Fluid overload, unspecified: Secondary | ICD-10-CM | POA: Diagnosis not present

## 2019-04-18 DIAGNOSIS — J9621 Acute and chronic respiratory failure with hypoxia: Secondary | ICD-10-CM | POA: Diagnosis present

## 2019-04-18 DIAGNOSIS — I5040 Unspecified combined systolic (congestive) and diastolic (congestive) heart failure: Secondary | ICD-10-CM | POA: Diagnosis not present

## 2019-04-18 DIAGNOSIS — I252 Old myocardial infarction: Secondary | ICD-10-CM | POA: Diagnosis not present

## 2019-04-18 DIAGNOSIS — I071 Rheumatic tricuspid insufficiency: Secondary | ICD-10-CM | POA: Diagnosis present

## 2019-04-18 DIAGNOSIS — R0602 Shortness of breath: Secondary | ICD-10-CM | POA: Diagnosis not present

## 2019-04-18 DIAGNOSIS — D631 Anemia in chronic kidney disease: Secondary | ICD-10-CM | POA: Diagnosis present

## 2019-04-18 DIAGNOSIS — E785 Hyperlipidemia, unspecified: Secondary | ICD-10-CM | POA: Diagnosis present

## 2019-04-18 DIAGNOSIS — E1122 Type 2 diabetes mellitus with diabetic chronic kidney disease: Secondary | ICD-10-CM | POA: Diagnosis present

## 2019-04-18 DIAGNOSIS — I509 Heart failure, unspecified: Secondary | ICD-10-CM

## 2019-04-18 DIAGNOSIS — Z8673 Personal history of transient ischemic attack (TIA), and cerebral infarction without residual deficits: Secondary | ICD-10-CM | POA: Diagnosis not present

## 2019-04-18 DIAGNOSIS — M109 Gout, unspecified: Secondary | ICD-10-CM | POA: Diagnosis present

## 2019-04-18 DIAGNOSIS — I130 Hypertensive heart and chronic kidney disease with heart failure and stage 1 through stage 4 chronic kidney disease, or unspecified chronic kidney disease: Secondary | ICD-10-CM | POA: Diagnosis present

## 2019-04-18 DIAGNOSIS — I129 Hypertensive chronic kidney disease with stage 1 through stage 4 chronic kidney disease, or unspecified chronic kidney disease: Secondary | ICD-10-CM | POA: Diagnosis not present

## 2019-04-18 HISTORY — DX: Other specified abnormalities of plasma proteins: R77.8

## 2019-04-18 HISTORY — DX: Other specified abnormal findings of blood chemistry: R79.89

## 2019-04-18 LAB — BASIC METABOLIC PANEL
Anion gap: 10 (ref 5–15)
BUN: 42 mg/dL — ABNORMAL HIGH (ref 8–23)
CO2: 26 mmol/L (ref 22–32)
Calcium: 9.2 mg/dL (ref 8.9–10.3)
Chloride: 105 mmol/L (ref 98–111)
Creatinine, Ser: 1.73 mg/dL — ABNORMAL HIGH (ref 0.44–1.00)
GFR calc Af Amer: 36 mL/min — ABNORMAL LOW (ref >60)
GFR calc non Af Amer: 31 mL/min — ABNORMAL LOW (ref >60)
Glucose, Bld: 144 mg/dL — ABNORMAL HIGH (ref 70–99)
Potassium: 3.8 mmol/L (ref 3.5–5.1)
Sodium: 141 mmol/L (ref 135–145)

## 2019-04-18 LAB — BRAIN NATRIURETIC PEPTIDE: B Natriuretic Peptide: 680.9 pg/mL — ABNORMAL HIGH (ref 0.0–100.0)

## 2019-04-18 LAB — GLUCOSE, CAPILLARY
Glucose-Capillary: 143 mg/dL — ABNORMAL HIGH (ref 70–99)
Glucose-Capillary: 128 mg/dL — ABNORMAL HIGH (ref 70–99)
Glucose-Capillary: 109 mg/dL — ABNORMAL HIGH (ref 70–99)
Glucose-Capillary: 118 mg/dL — ABNORMAL HIGH (ref 70–99)

## 2019-04-18 LAB — HEMOGLOBIN A1C
Hgb A1c MFr Bld: 7.4 % — ABNORMAL HIGH (ref 4.8–5.6)
Mean Plasma Glucose: 165.68 mg/dL

## 2019-04-18 LAB — SARS CORONAVIRUS 2 (TAT 6-24 HRS): SARS Coronavirus 2: NEGATIVE

## 2019-04-18 LAB — TROPONIN I (HIGH SENSITIVITY)
Troponin I (High Sensitivity): 18 ng/L — ABNORMAL HIGH (ref <18)
Troponin I (High Sensitivity): 15 ng/L (ref <18)

## 2019-04-18 LAB — MRSA PCR SCREENING: MRSA by PCR: NEGATIVE

## 2019-04-18 MED ORDER — SODIUM CHLORIDE 0.9% FLUSH
3.0000 mL | Freq: Two times a day (BID) | INTRAVENOUS | Status: DC
  Administered 2019-04-18 – 2019-04-20 (×5): 3 mL via INTRAVENOUS

## 2019-04-18 MED ORDER — ACETAMINOPHEN 325 MG PO TABS: 650.0000 mg | ORAL_TABLET | ORAL | Status: DC | PRN

## 2019-04-18 MED ORDER — CARVEDILOL 25 MG PO TABS
25.0000 mg | ORAL_TABLET | Freq: Two times a day (BID) | ORAL | Status: DC
  Administered 2019-04-18 – 2019-04-20 (×5): 25 mg via ORAL
  Filled 2019-04-18 (×5): qty 1

## 2019-04-18 MED ORDER — ASPIRIN EC 81 MG PO TBEC
81.0000 mg | DELAYED_RELEASE_TABLET | Freq: Every day | ORAL | Status: DC
  Administered 2019-04-18 – 2019-04-20 (×3): 81 mg via ORAL
  Filled 2019-04-18 (×3): qty 1

## 2019-04-18 MED ORDER — LOPERAMIDE HCL 2 MG PO CAPS
4.0000 mg | ORAL_CAPSULE | ORAL | Status: DC | PRN
  Administered 2019-04-19: 4 mg via ORAL
  Filled 2019-04-18: qty 2

## 2019-04-18 MED ORDER — SALINE SPRAY 0.65 % NA SOLN: 1.0000 | NASAL | Status: DC | PRN

## 2019-04-18 MED ORDER — ASPIRIN 81 MG PO CHEW
324.0000 mg | CHEWABLE_TABLET | Freq: Once | ORAL | Status: AC
  Administered 2019-04-18: 02:00:00 324 mg via ORAL
  Filled 2019-04-18: qty 4

## 2019-04-18 MED ORDER — PANTOPRAZOLE SODIUM 20 MG PO TBEC
20.0000 mg | DELAYED_RELEASE_TABLET | Freq: Every day | ORAL | Status: DC
  Administered 2019-04-18: 20 mg via ORAL
  Filled 2019-04-18 (×2): qty 1

## 2019-04-18 MED ORDER — DULOXETINE HCL 30 MG PO CPEP
30.0000 mg | ORAL_CAPSULE | Freq: Every day | ORAL | Status: DC
  Administered 2019-04-18 – 2019-04-20 (×3): 30 mg via ORAL
  Filled 2019-04-18 (×3): qty 1

## 2019-04-18 MED ORDER — B COMPLEX-C PO TABS
1.0000 | ORAL_TABLET | Freq: Every day | ORAL | Status: DC
  Administered 2019-04-18 – 2019-04-20 (×3): 1 via ORAL
  Filled 2019-04-18 (×3): qty 1

## 2019-04-18 MED ORDER — INSULIN ASPART 100 UNIT/ML ~~LOC~~ SOLN
0.0000 [IU] | Freq: Three times a day (TID) | SUBCUTANEOUS | Status: DC
  Administered 2019-04-18 – 2019-04-20 (×3): 1 [IU] via SUBCUTANEOUS

## 2019-04-18 MED ORDER — ALLOPURINOL 300 MG PO TABS
300.0000 mg | ORAL_TABLET | Freq: Every day | ORAL | Status: DC
  Administered 2019-04-18 – 2019-04-20 (×3): 300 mg via ORAL
  Filled 2019-04-18 (×3): qty 1

## 2019-04-18 MED ORDER — FUROSEMIDE 10 MG/ML IJ SOLN
80.0000 mg | Freq: Two times a day (BID) | INTRAMUSCULAR | Status: DC
  Administered 2019-04-18 – 2019-04-19 (×4): 80 mg via INTRAVENOUS
  Filled 2019-04-18 (×4): qty 8

## 2019-04-18 MED ORDER — SODIUM CHLORIDE 0.9 % IV SOLN
250.0000 mL | INTRAVENOUS | Status: DC | PRN
  Administered 2019-04-19: 250 mL via INTRAVENOUS

## 2019-04-18 MED ORDER — ENOXAPARIN SODIUM 40 MG/0.4ML ~~LOC~~ SOLN
40.0000 mg | Freq: Every day | SUBCUTANEOUS | Status: DC
  Administered 2019-04-18 – 2019-04-20 (×3): 40 mg via SUBCUTANEOUS
  Filled 2019-04-18 (×3): qty 0.4

## 2019-04-18 MED ORDER — FUROSEMIDE 10 MG/ML IJ SOLN
80.0000 mg | INTRAMUSCULAR | Status: AC
  Administered 2019-04-18: 80 mg via INTRAVENOUS
  Filled 2019-04-18: qty 8

## 2019-04-18 MED ORDER — ADULT MULTIVITAMIN W/MINERALS CH
1.0000 | ORAL_TABLET | Freq: Every day | ORAL | Status: DC
  Administered 2019-04-19 – 2019-04-20 (×2): 1 via ORAL
  Filled 2019-04-18 (×3): qty 1

## 2019-04-18 MED ORDER — METOCLOPRAMIDE HCL 10 MG PO TABS
5.0000 mg | ORAL_TABLET | Freq: Three times a day (TID) | ORAL | Status: DC
  Administered 2019-04-18 – 2019-04-20 (×6): 5 mg via ORAL
  Filled 2019-04-18 (×8): qty 1

## 2019-04-18 MED ORDER — POLYETHYLENE GLYCOL 3350 17 G PO PACK: 17.0000 g | PACK | Freq: Every day | ORAL | Status: DC | PRN

## 2019-04-18 MED ORDER — INSULIN GLARGINE 100 UNIT/ML ~~LOC~~ SOLN
35.0000 [IU] | Freq: Every day | SUBCUTANEOUS | Status: DC
  Administered 2019-04-18: 35 [IU] via SUBCUTANEOUS
  Filled 2019-04-18 (×3): qty 0.35

## 2019-04-18 MED ORDER — SODIUM CHLORIDE 0.9% FLUSH: 3.0000 mL | INTRAVENOUS | Status: DC | PRN

## 2019-04-18 MED ORDER — ATORVASTATIN CALCIUM 80 MG PO TABS
80.0000 mg | ORAL_TABLET | Freq: Every day | ORAL | Status: DC
  Administered 2019-04-18 – 2019-04-19 (×2): 80 mg via ORAL
  Filled 2019-04-18 (×2): qty 1

## 2019-04-18 MED ORDER — GABAPENTIN 100 MG PO CAPS
100.0000 mg | ORAL_CAPSULE | Freq: Two times a day (BID) | ORAL | Status: DC
  Administered 2019-04-18 – 2019-04-20 (×5): 100 mg via ORAL
  Filled 2019-04-18 (×5): qty 1

## 2019-04-18 NOTE — Plan of Care (Signed)
  Problem: Education: Goal: Knowledge of General Education information will improve Description: Including pain rating scale, medication(s)/side effects and non-pharmacologic comfort measures Outcome: Progressing   Problem: Clinical Measurements: Goal: Ability to maintain clinical measurements within normal limits will improve Outcome: Progressing Goal: Will remain free from infection Outcome: Not Applicable Goal: Diagnostic test results will improve Outcome: Progressing Goal: Respiratory complications will improve Outcome: Progressing   Problem: Nutrition: Goal: Adequate nutrition will be maintained Outcome: Progressing   Problem: Coping: Goal: Level of anxiety will decrease Outcome: Progressing   Problem: Elimination: Goal: Will not experience complications related to bowel motility Outcome: Not Applicable   Problem: Safety: Goal: Ability to remain free from injury will improve Outcome: Progressing

## 2019-04-18 NOTE — H&P (Signed)
History and Physical    Kara Hanson SFK:[id number] DOB: 11/28/1955 DOA: 04/17/2019  PCP: [name], MD Patient coming from: Home  Chief Complaint: Shortness of breath  HPI: Kara Hanson is a 64 y.o. female with medical history significant of chronic combined systolic and diastolic congestive heart failure, chronic respiratory failure on 4 L home oxygen, CVA, type 2 diabetes, hypertension, hyperlipidemia, CKD stage III, gout presenting to the ED for evaluation of shortness of breath.  Patient states since yesterday she has been having a lot of shortness of breath.  Also having orthopnea and worsening bilateral lower extremity edema.  She went to see her nephrologist who doubled the dose of her home Lasix from 80 mg once a day to twice a day.  Despite taking additional Lasix she was not having enough urine output.  States she had a brief episode of substernal sharp chest discomfort while in the ED waiting room which lasted only for a few seconds.  No chest pain at present.  She normally uses 4 L home oxygen all the time but yesterday required 5 L.  ED Course: Afebrile and no leukocytosis.  Hemoglobin 9.2, at baseline.  Creatinine 1.6, at baseline.  BNP elevated at 592.  Chest x-ray showing cardiomegaly with vascular congestion and interstitial edema.  Also showing focal bibasilar airspace opacities suspicious for atelectasis versus pneumonia. Patient reported an episode of chest pain in the ED waiting room.  She was given aspirin 325 mg.  EKG without acute ischemic changes.  Initial high-sensitivity troponin 18, repeat pending. Received IV Lasix 80 mg. SARS-CoV-2 PCR test pending.  Review of Systems:  All systems reviewed and apart from history of presenting illness, are negative.  Past Medical History:  Diagnosis Date  . CHF (congestive heart failure) ([place])   . CVA (cerebral vascular accident) ([place])   . Diabetes mellitus without complication ([place])   . Hypercholesteremia   .  Hypertension   . Myocardial infarction ([place])   . Pneumonia 11/01/2018  . Spinal stenosis     Past Surgical History:  Procedure Laterality Date  . BIOPSY  01/27/2019   Procedure: BIOPSY;  Surgeon: Otis Brace, MD;  Location: [facility];  Service: Gastroenterology;;  . BLADDER SURGERY    . CARDIAC CATHETERIZATION N/A 04/25/2016   Procedure: Left Heart Cath and Coronary Angiography;  Surgeon: Lorretta Harp, MD;  Location: [facility];  Service: Cardiovascular;  Laterality: N/A;  . CARDIAC CATHETERIZATION  02/11/2019  . CESAREAN SECTION    . CHOLECYSTECTOMY    . ESOPHAGOGASTRODUODENOSCOPY (EGD) WITH PROPOFOL N/A 01/27/2019   Procedure: ESOPHAGOGASTRODUODENOSCOPY (EGD) WITH PROPOFOL;  Surgeon: Otis Brace, MD;  Location: [facility];  Service: Gastroenterology;  Laterality: N/A;  . RIGHT/LEFT HEART CATH AND CORONARY ANGIOGRAPHY N/A 02/11/2019   Procedure: RIGHT/LEFT HEART CATH AND CORONARY ANGIOGRAPHY;  Surgeon: Jolaine Artist, MD;  Location: [place] [facility];  Service: Cardiovascular;  Laterality: N/A;  . VIDEO BRONCHOSCOPY N/A 02/01/2019   Procedure: VIDEO BRONCHOSCOPY WITHOUT FLUORO;  Surgeon: Candee Furbish, MD;  Location: [facility];  Service: Endoscopy;  Laterality: N/A;     reports that she has never smoked. She has never used smokeless tobacco. She reports that she does not drink alcohol or use drugs.  Allergies  Allergen Reactions  . Garlic Shortness Of Breath, Itching and Swelling    Raw*  Hand itching and swelling  . Latex Itching  . Morphine And Related Itching and Other (See Comments)    Headache   . Other  Itching    Reaction to newspaper ink -itching and headache    Family History  Problem Relation Age of Onset  . Diabetes Mellitus II Father   . Stroke Father   . Healthy Mother        She is 34 years old.     Prior to Admission medications   Medication Sig Start Date End Date Taking? Authorizing Provider  allopurinol  (ZYLOPRIM) 300 MG tablet Take 300 mg by mouth daily.   Yes [provider]  aspirin EC 81 MG tablet Take 81 mg by mouth daily.   Yes [provider]  atorvastatin (LIPITOR) 80 MG tablet Take 80 mg by mouth daily at 6 PM.   Yes [provider]  carvedilol (COREG) 25 MG tablet Take 1 tablet (25 mg total) by mouth 2 (two) times daily with a meal. 02/27/19  Yes Newlin, Enobong, MD  DULoxetine (CYMBALTA) 30 MG capsule Take 1 capsule (30 mg total) by mouth daily. 02/06/19  Yes Dana Allan I, MD  furosemide (LASIX) 80 MG tablet Take 80 mg by mouth 2 (two) times daily.   Yes [provider]  gabapentin (NEURONTIN) 100 MG capsule Take 1 capsule (100 mg total) by mouth 2 (two) times daily. 02/27/19  Yes [name], MD  insulin aspart (NOVOLOG) 100 UNIT/ML injection 0 to 12 units subcutaneously 3 times daily before meals as per sliding scale 02/27/19  Yes Newlin, Enobong, MD  Insulin Glargine (BASAGLAR KWIKPEN) 100 UNIT/ML SOPN Inject 0.35 mLs (35 Units total) into the skin at bedtime. 02/27/19  Yes [name], MD  lansoprazole (PREVACID) 15 MG capsule Take 15 mg by mouth daily.  10/02/18  Yes [provider]  metoCLOPramide (REGLAN) 5 MG tablet Take 1 tablet (5 mg total) by mouth 3 (three) times daily. 02/05/19 02/05/20 Yes Bonnell Public, MD  Multiple Vitamins-Minerals (MULTIVITAMIN WITH MINERALS) tablet Take 1 tablet by mouth daily.   Yes [provider]  polyethylene glycol (MIRALAX / GLYCOLAX) 17 g packet Take 17 g by mouth daily. Patient taking differently: Take 17 g by mouth daily as needed for moderate constipation.  10/02/18  Yes Bonnielee Haff, MD  SUPER B COMPLEX/C CAPS Take 1 capsule by mouth daily.   Yes [provider]  Accu-Chek FastClix Lancets MISC USE AS DIRECTED TO TEST BLOOD SUGAR THREE TIMES DAILY 07/31/18   [name], MD  Blood Glucose Monitoring Suppl (ACCU-CHEK GUIDE) w/Device KIT 1 each by Does not  apply route 3 (three) times daily. 07/31/18   [name], MD  EASY COMFORT PEN NEEDLES 31G X 5 MM MISC USE FOUR TIMES PER DAY FOR INSULIN ADMINISTRATION Patient taking differently: 4 (four) times daily.  10/02/18   [name], MD  glucose blood (ACCU-CHEK GUIDE) test strip USE AS DIRECTED TO TEST BLOOD SUGAR THREE TIMES DAILY 07/31/18   [name], MD  Lancet Device MISC Use as instructed 3 times daily 09/19/17   Renita Papa, PA-C  Misc. Devices MISC Portable oxygen concentrator.  Diagnosis-chronic respiratory failure. 09/03/18   [name], MD  Misc. Devices MISC [place] with seat. Dx: Congestive Heart Failure 10/19/18   [name], MD  sodium chloride (OCEAN) 0.65 % SOLN nasal spray Place 1 spray into both nostrils as needed for congestion.    [provider]    Physical Exam: Vitals:   04/18/19 0230 04/18/19 0300 04/18/19 0315 04/18/19 0405  BP: (!) 162/77 (!) 161/76 (!) 148/65 (!) 165/75  Pulse: 75 74 71  Resp: 17 (!) 22 18   Temp:    98.4 F (36.9 C)  TempSrc:    Oral  SpO2: 100% 100% 100%   Weight:    78 kg  Height:    '4\' 11"'$  (1.499 m)    Physical Exam  Constitutional: She is oriented to person, place, and time. She appears well-developed and well-nourished. No distress.  HENT:  Head: Normocephalic.  Eyes: Right eye exhibits no discharge. Left eye exhibits no discharge.  Neck: JVD present.  Cardiovascular: Normal rate, regular rhythm and intact distal pulses.  Pulmonary/Chest: Effort normal. No respiratory distress. She has no wheezes. She has rales.  Bibasilar rales Oxygen saturation 100% on 4 L supplemental oxygen  Abdominal: Soft. Bowel sounds are normal. She exhibits distension. There is no abdominal tenderness. There is no rebound and no guarding.  Musculoskeletal:        General: Edema present.     Cervical back: Neck supple.     Comments: +3 pitting edema of bilateral lower extremities  Neurological: She is alert and oriented to  person, place, and time.  Skin: Skin is warm and dry. She is not diaphoretic.     Labs on Admission: I have personally reviewed following labs and imaging studies  CBC: Recent Labs  Lab 04/17/19 1722  WBC 5.3  HGB 9.2*  HCT 30.5*  MCV 91.0  PLT 425   Basic Metabolic Panel: Recent Labs  Lab 04/17/19 1722  NA 140  K 3.9  CL 103  CO2 26  GLUCOSE 184*  BUN 41*  CREATININE 1.69*  CALCIUM 9.5   GFR: Estimated Creatinine Clearance: 30.3 mL/min (A) (by C-G formula based on SCr of 1.69 mg/dL (H)). Liver Function Tests: No results for input(s): AST, ALT, ALKPHOS, BILITOT, PROT, ALBUMIN in the last 168 hours. No results for input(s): LIPASE, AMYLASE in the last 168 hours. No results for input(s): AMMONIA in the last 168 hours. Coagulation Profile: No results for input(s): INR, PROTIME in the last 168 hours. Cardiac Enzymes: No results for input(s): CKTOTAL, CKMB, CKMBINDEX, TROPONINI in the last 168 hours. BNP (last 3 results) No results for input(s): PROBNP in the last 8760 hours. HbA1C: No results for input(s): HGBA1C in the last 72 hours. CBG: No results for input(s): GLUCAP in the last 168 hours. Lipid Profile: No results for input(s): CHOL, HDL, LDLCALC, TRIG, CHOLHDL, LDLDIRECT in the last 72 hours. Thyroid Function Tests: No results for input(s): TSH, T4TOTAL, FREET4, T3FREE, THYROIDAB in the last 72 hours. Anemia Panel: No results for input(s): VITAMINB12, FOLATE, FERRITIN, TIBC, IRON, RETICCTPCT in the last 72 hours. Urine analysis:    Component Value Date/Time   COLORURINE YELLOW 01/28/2019 0714   APPEARANCEUR TURBID (A) 01/28/2019 0714   LABSPEC 1.015 01/28/2019 0714   PHURINE 5.0 01/28/2019 0714   GLUCOSEU 50 (A) 01/28/2019 0714   HGBUR NEGATIVE 01/28/2019 0714   BILIRUBINUR NEGATIVE 01/28/2019 0714   BILIRUBINUR negative 12/13/2017 [place] 01/28/2019 0714   PROTEINUR >=300 (A) 01/28/2019 0714   UROBILINOGEN 0.2 12/13/2017 1514    NITRITE NEGATIVE 01/28/2019 0714   LEUKOCYTESUR MODERATE (A) 01/28/2019 0714    Radiological Exams on Admission: DG Chest 2 View  Result Date: 04/17/2019 CLINICAL DATA:  Shortness of breath EXAM: CHEST - 2 VIEW COMPARISON:  02/14/2019 FINDINGS: Mild cardiomegaly and vascular congestion. Diffuse interstitial prominence throughout the lungs could represent edema. There are bibasilar opacities which could reflect atelectasis or infiltrates. No visible effusions or acute bony abnormality. IMPRESSION: Cardiomegaly  with vascular congestion and interstitial edema. Focal bibasilar airspace opacities could reflect atelectasis or pneumonia. Electronically Signed   By: Rolm Baptise M.D.   On: 04/17/2019 18:11    EKG: Independently reviewed.  Sinus rhythm, artifact, T wave abnormality in lateral leads.  No significant change since prior tracing.  Assessment/Plan Principal Problem:   Acute exacerbation of CHF (congestive heart failure) (HCC) Active Problems:   Gout   Acute on chronic respiratory failure with hypoxia (HCC)   CKD (chronic kidney disease), stage III   Elevated troponin   Acute on chronic combined systolic and diastolic CHF/nonischemic cardiomyopathy Echo done 02/10/2019 with LVEF 30 to 35% and grade 3 diastolic dysfunction.  Cath done 02/11/2019 showing mild nonobstructive CAD.  Patient presenting with complaints of dyspnea, orthopnea, and worsening bilateral lower extremity edema.  Appears volume overloaded on exam with JVD, bibasilar rales, abdominal distention, and bilateral lower extremity edema.  BNP elevated at 592.  Chest x-ray showing cardiomegaly with vascular congestion and interstitial edema.  Also showing focal bibasilar airspace opacities, likely atelectasis.  Pneumonia less likely given no fever or leukocytosis.  SARS-CoV-2 PCR test pending, however, patient already had Covid back in June 2020. -Cardiac monitoring -Received IV Lasix 80 mg in the ED.  Continue IV Lasix 80 mg twice  daily starting in the morning. -Continue home Coreg -Monitor intake and output, daily weights, low-sodium diet with fluid restriction  Acute on chronic hypoxic respiratory failure secondary to acutely decompensated CHF She was requiring additional oxygen at home. Currently satting well on 4 L home oxygen. -Continuous pulse ox -Continue supplemental oxygen  Chest pain/ mild troponin elevation Likely secondary to demand ischemia from acutely decompensated CHF.  Patient had a brief episode of chest pain in the ED waiting room.  She was given aspirin 325 mg.  EKG without acute ischemic changes.  Initial high-sensitivity troponin 18.  Currently chest pain-free and appears comfortable.  Recent cardiac cath showing mild nonobstructive CAD. -Cardiac monitoring -Second set of troponin pending  CKD stage III Stable.  Creatinine 1.6, at baseline. -Monitor renal function with IV diuresis  Insulin-dependent diabetes mellitus -Check A1c.  Sliding scale insulin sensitive before meals and CBG checks. -Continue home glargine at bedtime  Diabetic neuropathy -Continue gabapentin  Gout -Continue allopurinol  Hyperlipidemia -Continue Lipitor  Hypertension Blood pressure slightly elevated. -Continue Coreg -Lasix as above  DVT prophylaxis: Lovenox Code Status: Full code.  Discussed with the patient. Family Communication: No family at bedside. Disposition Plan: Anticipate discharge after clinical improvement. Consults called: None Admission status: It is my clinical opinion that admission to INPATIENT is reasonable and necessary in this 64 y.o. female . presenting with volume overload secondary to acutely decompensated CHF.  Patient will need IV diuresis for several days.  Given the aforementioned, the predictability of an adverse outcome is felt to be significant. I expect that the patient will require at least 2 midnights in the hospital to treat this condition.   The medical decision making  on this patient was of high complexity and the patient is at high risk for clinical deterioration, therefore this is a level 3 visit.  Shela Leff MD [facility] Pager [phone]  If 7PM-7AM, please contact night-coverage [email or web address] Password [facility]  04/18/2019, 6:29 AM

## 2019-04-18 NOTE — Progress Notes (Signed)
PROGRESS NOTE    Patient: Kara Hanson                            PCP: [name], MD                    DOB: 1955-05-03            DOA: 04/17/2019 [id number]             DOS: 04/18/2019, 12:15 PM   LOS: 0 days   Date of Service: The patient was seen and examined on 04/18/2019  Subjective:   The patient was seen and examined this morning, remained stable, but in respiratory distress, with SOB, requiring 4-5 L of oxygen,  Currently satting 99%, denies any chest pain  Brief Narrative:   Kara Hanson is a 64 y.o. female with PMH/o combined systolic and diastolic congestive heart failure, chronic respiratory failure on 4 L home oxygen, CVA, type 2 diabetes, hypertension, hyperlipidemia, CKD stage III, gout presenting with progressive shortness of breath and edema. Patient wound noted to be on acute on chronic s/d CHF exacerbation. Recently increase Lasix to 80 mg twice daily by nephrologist with no relief.  ED Course: Was hemodynamically stable on 5 L of oxygen, BNP elevated to 592, chest x-ray consistent with cardiomegaly, vascular congestion, intestinal edema SARS-CoV-2-negative (history of Covid infection last year)  04/18/2019 -remained to be in mild respiratory stress with 5 L of oxygen, satting Heart failure team consulted, nephrology consult -continue Lasix    Assessment & Plan:   Principal Problem:   Acute exacerbation of CHF (congestive heart failure) (HCC) Active Problems:   Gout   Acute on chronic respiratory failure with hypoxia (HCC)   CKD (chronic kidney disease), stage III   Elevated troponin    Acute on chronic combined systolic and diastolic CHF/nonischemic cardiomyopathy -Remains on 4-5 L of oxygen, with shortness of breath, satting 99% -Positive for JVD, bibasilar Rales, congestion, lower extremity edema, elevated proBNP 680 -Volume overload -Echo done 02/10/2019 with LVEF 30 to 35% and grade 3 diastolic dysfunction.   -Cath done 02/11/2019  showing mild nonobstructive CAD.  Patient presenting with complaints of  -Chest x-ray showing cardiomegaly with vascular congestion and interstitial edema.  Also showing focal bibasilar airspace opacities, likely atelectasis.   -Pneumonia less likely given no fever or leukocytosis.  SARS-CoV-2 PCR negative, however, patient already had Covid back in June 2020. --We will continue cardiac monitor, IV Lasix 80 twice daily -Daily weight, I's and O's monitoring -Continue home Coreg -low-sodium diet with fluid restriction -Cardiology/heart failure team consulted appreciate input  Acute on chronic hypoxic respiratory failure  -secondary to acutely decompensated CHF She was requiring additional oxygen at home. Currently satting well on 4 L home oxygen. -Continuous pulse ox -Continue supplemental oxygen -Continue to monitor closely, DuoNeb treatment and diuretics  Chest pain/ mild troponin elevation -Due to above, likely ischemic demand due to CHF exacerbation -Continue aspirin, as needed nitroglycerin and morphine, -Repeat EKG as needed -  Initial high-sensitivity troponin 18 >>15 -  Currently chest pain-free and appears comfortable.  - Recent cardiac cath showing mild nonobstructive CAD. -Cardiac monitoring   CKD stage III Stable.  Creatinine 1.6, at baseline. -Monitor renal function with IV diuresis  Insulin-dependent diabetes mellitus -Check A1c.7.4   Sliding scale insulin sensitive before meals and CBG checks. -Continue home glargine at bedtime  Diabetic neuropathy -Continue gabapentin  Gout -Continue allopurinol  Hyperlipidemia -Continue  Lipitor  Hypertension Blood pressure slightly elevated. -Continue Coreg -Lasix as above  DVT prophylaxis: Lovenox Code Status: Full code.  Discussed with the patient. Family Communication: No family at bedside. Disposition Plan: Anticipate discharge after clinical improvement. Consults called:  Heart failure team,  nephrologist Primary nephrologist Dr. Melrose Nakayama   Procedures:   No admission procedures for hospital encounter.     Antimicrobials:  Anti-infectives (From admission, onward)   None       Medication:  . allopurinol  300 mg Oral Daily  . aspirin EC  81 mg Oral Daily  . atorvastatin  80 mg Oral q1800  . B-complex with vitamin C  1 tablet Oral Daily  . carvedilol  25 mg Oral BID WC  . DULoxetine  30 mg Oral Daily  . enoxaparin (LOVENOX) injection  40 mg Subcutaneous Daily  . furosemide  80 mg Intravenous BID  . gabapentin  100 mg Oral BID  . insulin aspart  0-9 Units Subcutaneous TID WC  . insulin glargine  35 Units Subcutaneous QHS  . metoCLOPramide  5 mg Oral TID WC  . multivitamin with minerals  1 tablet Oral Daily  . pantoprazole  20 mg Oral Daily  . sodium chloride flush  3 mL Intravenous Q12H    sodium chloride, acetaminophen, polyethylene glycol, sodium chloride, sodium chloride flush   Objective:   Vitals:   04/18/19 0405 04/18/19 0842 04/18/19 1109 04/18/19 1159  BP: (!) 165/75 (!) 88/76  115/66  Pulse:    64  Resp:    20  Temp: 98.4 F (36.9 C) 98.4 F (36.9 C) 97.8 F (36.6 C) 98.3 F (36.8 C)  TempSrc: Oral Oral Oral Oral  SpO2:    99%  Weight: 78 kg     Height: 4\' 11"  (1.499 m)       Intake/Output Summary (Last 24 hours) at 04/18/2019 1215 Last data filed at 04/18/2019 1000 Gross per 24 hour  Intake 123 ml  Output 0 ml  Net 123 ml   Filed Weights   04/18/19 0405  Weight: 78 kg     Examination:   Physical Exam  Constitution:  Alert, cooperative, no distress,  Appears calm and comfortable  Psychiatric: Normal and stable mood and affect, cognition intact,   HEENT: Normocephalic, PERRL, otherwise with in Normal limits  Chest:Chest symmetric Cardio vascular:  S1/S2, RRR, No murmure, No Rubs or Gallops  pulmonary: Bibasilar Rales, mild rhonchi, negative any wheezes, positive breath sounds diminished at the lower lobes Abdomen: Soft,  non-tender, non-distended, bowel sounds,no masses, no organomegaly Muscular skeletal: Limited exam - in bed, able to move all 4 extremities, Normal strength,  Neuro: CNII-XII intact. , normal motor and sensation, reflexes intact  Extremities: ++  pitting edema lower extremities, +2 pulses  Skin: Dry, warm to touch, negative for any Rashes, No open wounds Wounds: per nursing documentation        LABs:  CBC Latest Ref Rng & Units 04/17/2019 02/18/2019 02/17/2019  WBC 4.0 - 10.5 K/uL 5.3 4.1 3.8(L)  Hemoglobin 12.0 - 15.0 g/dL 9.2(L) 7.9(L) 7.7(L)  Hematocrit 36.0 - 46.0 % 30.5(L) 25.9(L) 25.2(L)  Platelets 150 - 400 K/uL 267 258 235   CMP Latest Ref Rng & Units 04/17/2019 02/19/2019 02/18/2019  Glucose 70 - 99 mg/dL 184(H) 105(H) 129(H)  BUN 8 - 23 mg/dL 41(H) 37(H) 40(H)  Creatinine 0.44 - 1.00 mg/dL 1.69(H) 1.95(H) 2.08(H)  Sodium 135 - 145 mmol/L 140 138 137  Potassium 3.5 - 5.1 mmol/L 3.9 4.8  4.9  Chloride 98 - 111 mmol/L 103 97(L) 98  CO2 22 - 32 mmol/L 26 31 30   Calcium 8.9 - 10.3 mg/dL 9.5 9.5 9.3  Total Protein 6.5 - 8.1 g/dL - - -  Total Bilirubin 0.3 - 1.2 mg/dL - - -  Alkaline Phos 38 - 126 U/L - - -  AST 15 - 41 U/L - - -  ALT 0 - 44 U/L - - -     SIGNED: Deatra James, MD, FACP, FHM. [facility],  Pager [phone]  If 7PM-7AM, please contact night-coverage [email or web address] 04/18/2019, 12:15 PM

## 2019-04-18 NOTE — ED Notes (Signed)
First attempt to call report unsuccessful. 

## 2019-04-18 NOTE — ED Provider Notes (Signed)
[facility] EMERGENCY DEPARTMENT Provider Note   CSN: [id number] Arrival date & time: 04/17/19  1621     History Chief Complaint  Patient presents with  . Shortness of Breath    Kara Hanson is a 64 y.o. female.  The history is provided by medical records and the patient.  Shortness of Breath     64 y.o. F with hx of CHF with EF of 30-35%, AKI, DM2, HLP, HTN, anemia, Vit D deficiency, presenting to the ED with SOB.  States she has been feeling poorly for about a week now, increased SOB with simple tasks at home like going to the bathroom, etc.  states she will have to stop and rest several times before she can make it there. States her nephrologist, Dr. Hollie Salk, has increased her home Lasix to 19m BID but she has not had any increased urine output or improvement of her symptoms.  She is on 4L O2 at baseline, now requiring 5 at rest and even more when exerting energy.  Past Medical History:  Diagnosis Date  . CHF (congestive heart failure) (HNaranja   . CVA (cerebral vascular accident) ([facility] [place]   . Diabetes mellitus without complication (H[place]   . Hypercholesteremia   . Hypertension   . Myocardial infarction (HR[place]cl[place]   . Pneumonia 11/01/2018  . Spinal stenosis     Patient Active Problem List   Diagnosis Date Noted  . Acute respiratory failure with hypoxia (H[place] 02/09/2019  . NSTEMI (non-ST elevated myocardial infarction) (HT[place] 02/09/2019  . S/P thoracentesis   . Atelectasis   . Chronic respiratory failure (H[place]   . Acute renal failure superimposed on stage 3 chronic kidney disease (H[place]   . Epigastric pain   . CHF (congestive heart failure) (HH[place] 01/23/2019  . CAP (community acquired pneumonia) 10/31/2018  . Acute CHF (congestive heart failure) (HM[place] 09/24/2018  . SIRS (systemic inflammatory response syndrome) (HH[place] 07/25/2018  . COVID-19 virus infection 07/20/2018  . Chronic diastolic CHF (congestive heart failure) ([place] 07/20/2018  . Pneumonia due to  COVID-19 virus 07/20/2018  . Vitamin D deficiency 04/02/2018  . Spinal stenosis 03/29/2018  . Acute and chronic respiratory failure with hypoxia ([place] 03/20/2018  . Controlled type 2 diabetes with neuropathy (HL[facility] 03/20/2018  . CKD (chronic kidney disease), stage III 03/20/2018  . Acute on chronic combined systolic and diastolic CHF (congestive heart failure) (H[place] 11/27/2017  . AKI (acute kidney injury) (HR[place] 11/27/2017  . Acute respiratory distress 11/27/2017  . Bulging lumbar disc 11/14/2017  . Gout 11/14/2017  . Hyperlipidemia LDL goal <70 09/30/2016  . Chest pain 06/17/2016  . Stress-induced cardiomyopathy 05/19/2016  . Type 2 diabetes mellitus with diabetic neuropathy, unspecified (HM[place] 05/06/2016  . Vertigo 05/06/2016  . Controlled type 2 diabetes mellitus with hyperglycemia (HFoosland 04/23/2016  . Hypertension 04/23/2016  . Normochromic normocytic anemia 04/23/2016  . Syncope 04/23/2016    Past Surgical History:  Procedure Laterality Date  . BIOPSY  01/27/2019   Procedure: BIOPSY;  Surgeon: BOtis Brace MD;  Location: [facility];  Service: Gastroenterology;;  . BLADDER SURGERY    . CARDIAC CATHETERIZATION N/A 04/25/2016   Procedure: Left Heart Cath and Coronary Angiography;  Surgeon: JLorretta Harp MD;  [facility];  Service: Cardiovascular;  Laterality: N/A;  . CARDIAC CATHETERIZATION  02/11/2019  . CESAREAN SECTION    . CHOLECYSTECTOMY    . ESOPHAGOGASTRODUODENOSCOPY (EGD) WITH PROPOFOL N/A 01/27/2019   Procedure: ESOPHAGOGASTRODUODENOSCOPY (EGD) WITH PROPOFOL;  Surgeon: BOtis Brace MD;  Location: [facility];  Service: Gastroenterology;  Laterality: N/A;  . RIGHT/LEFT HEART CATH AND CORONARY ANGIOGRAPHY N/A 02/11/2019   Procedure: RIGHT/LEFT HEART CATH AND CORONARY ANGIOGRAPHY;  Surgeon: Jolaine Artist, MD;  Location: [facility];  Service: Cardiovascular;  Laterality: N/A;  . VIDEO BRONCHOSCOPY N/A 02/01/2019   Procedure:  VIDEO BRONCHOSCOPY WITHOUT FLUORO;  Surgeon: Candee Furbish, MD;  Location: [facility];  Service: Endoscopy;  Laterality: N/A;     OB History   No obstetric history on file.     Family History  Problem Relation Age of Onset  . Diabetes Mellitus II Father   . Stroke Father   . Healthy Mother        She is 31 years old.     Social History   Tobacco Use  . Smoking status: Never Smoker  . Smokeless tobacco: Never Used  Substance Use Topics  . Alcohol use: No  . Drug use: No    Home Medications Prior to Admission medications   Medication Sig Start Date End Date Taking? Authorizing Provider  Accu-Chek FastClix Lancets MISC USE AS DIRECTED TO TEST BLOOD SUGAR THREE TIMES DAILY 07/31/18   [name], MD  allopurinol (ZYLOPRIM) 300 MG tablet Take 0.5 tablets (150 mg total) by mouth daily. 02/27/19 03/29/19  [name], MD  Amino Acids-Protein Hydrolys (FEEDING SUPPLEMENT, PRO-STAT SUGAR FREE 64,) LIQD Take 30 mLs by mouth 2 (two) times daily. 02/05/19   Dana Allan I, MD  aspirin EC 81 MG tablet Take 81 mg by mouth daily.    [provider]  atorvastatin (LIPITOR) 80 MG tablet Take 1 tablet (80 mg total) by mouth daily at 6 PM. 02/27/19 03/29/19  [name], MD  Blood Glucose Monitoring Suppl (ACCU-CHEK GUIDE) w/Device KIT 1 each by Does not apply route 3 (three) times daily. 07/31/18   [name], MD  carvedilol (COREG) 25 MG tablet Take 1 tablet (25 mg total) by mouth 2 (two) times daily with a meal. 02/27/19   [name], MD  DULoxetine (CYMBALTA) 30 MG capsule Take 1 capsule (30 mg total) by mouth daily. 02/06/19   Bonnell Public, MD  EASY COMFORT PEN NEEDLES 31G X 5 MM MISC USE FOUR TIMES PER DAY FOR INSULIN ADMINISTRATION Patient taking differently: 4 (four) times daily.  10/02/18   [name], MD  feeding supplement, ENSURE ENLIVE, (ENSURE ENLIVE) LIQD Take 237 mLs by mouth 3 (three) times daily between meals. Patient not taking:  Reported on 02/27/2019 02/05/19   Dana Allan I, MD  furosemide (LASIX) 80 MG tablet Take 1 tablet (80 mg total) by mouth 2 (two) times daily. 02/27/19 03/29/19  [name], MD  gabapentin (NEURONTIN) 100 MG capsule Take 1 capsule (100 mg total) by mouth 2 (two) times daily. 02/27/19   [name], MD  glucose blood (ACCU-CHEK GUIDE) test strip USE AS DIRECTED TO TEST BLOOD SUGAR THREE TIMES DAILY 07/31/18   [name], MD  insulin aspart (NOVOLOG) 100 UNIT/ML injection 0 to 12 units subcutaneously 3 times daily before meals as per sliding scale 02/27/19   [name], MD  Insulin Glargine (BASAGLAR KWIKPEN) 100 UNIT/ML SOPN Inject 0.35 mLs (35 Units total) into the skin at bedtime. 02/27/19   [name], MD  isosorbide-hydrALAZINE (BIDIL) 20-37.5 MG tablet Take 1 tablet by mouth 3 (three) times daily. Patient not taking: Reported on 02/27/2019 02/05/19   Bonnell Public, MD  Lancet Device MISC Use as instructed 3 times daily 09/19/17  Fawze, Mina A, PA-C  lansoprazole (PREVACID) 15 MG capsule Take 15 mg by mouth daily.  10/02/18   [provider]  metoCLOPramide (REGLAN) 5 MG tablet Take 1 tablet (5 mg total) by mouth 3 (three) times daily. 02/05/19 02/05/20  Bonnell Public, MD  Misc. Devices MISC Portable oxygen concentrator.  Diagnosis-chronic respiratory failure. 09/03/18   [name], MD  Misc. Devices MISC Rollaor with seat. Dx: Congestive Heart Failure 10/19/18   [name], MD  polyethylene glycol (MIRALAX / GLYCOLAX) 17 g packet Take 17 g by mouth daily. Patient taking differently: Take 17 g by mouth daily as needed for moderate constipation.  10/02/18   Bonnielee Haff, MD  sodium chloride (OCEAN) 0.65 % SOLN nasal spray Place 1 spray into both nostrils as needed for congestion.    [provider]  SUPER B COMPLEX/C CAPS Take 1 capsule by mouth daily.    [provider]    Allergies    Garlic, Latex, Morphine and related,  and Other  Review of Systems   Review of Systems  Respiratory: Positive for shortness of breath.   Cardiovascular: Positive for leg swelling.  All other systems reviewed and are negative.   Physical Exam Updated Vital Signs BP (!) 161/74 (BP Location: Left Arm)   Pulse 80   Temp 97.8 F (36.6 C) (Oral)   Resp 18   SpO2 98%   Physical Exam Vitals and nursing note reviewed.  Constitutional:      Appearance: She is well-developed.  HENT:     Head: Normocephalic and atraumatic.  Eyes:     Conjunctiva/sclera: Conjunctivae normal.     Pupils: Pupils are equal, round, and reactive to light.  Cardiovascular:     Rate and Rhythm: Normal rate and regular rhythm.     Heart sounds: Normal heart sounds.  Pulmonary:     Effort: Pulmonary effort is normal. Tachypnea present.     Breath sounds: Rales present.     Comments: tachypneic with increased work of breathing, 5L O2 in use, still appears labored and winded when talking, diffuse rales present Abdominal:     General: Bowel sounds are normal.     Palpations: Abdomen is soft.  Musculoskeletal:        General: Normal range of motion.     Cervical back: Normal range of motion.     Comments: 3+ pitting edema from feet up to knees, this appears largely symmetric without any overlying skin changes, DP pulses intact bilaterally  Skin:    General: Skin is warm and dry.  Neurological:     Mental Status: She is alert and oriented to person, place, and time.     ED Results / Procedures / Treatments   Labs (all labs ordered are listed, but only abnormal results are displayed) Labs Reviewed  BASIC METABOLIC PANEL - Abnormal; Notable for the following components:      Result Value   Glucose, Bld 184 (*)    BUN 41 (*)    Creatinine, Ser 1.69 (*)    GFR calc non Af Amer 32 (*)    GFR calc Af Amer 37 (*)    All other components within normal limits  CBC - Abnormal; Notable for the following components:   RBC 3.35 (*)    Hemoglobin  9.2 (*)    HCT 30.5 (*)    RDW 15.8 (*)    All other components within normal limits  BRAIN NATRIURETIC PEPTIDE - Abnormal; Notable for the  following components:   B Natriuretic Peptide 592.8 (*)    All other components within normal limits  SARS CORONAVIRUS 2 (TAT 6-24 HRS)  TROPONIN I (HIGH SENSITIVITY)    EKG EKG Interpretation  Date/Time:  Wednesday April 17 2019 17:15:21 EST Ventricular Rate:  87 PR Interval:  150 QRS Duration: 84 QT Interval:  372 QTC Calculation: 447 R Axis:   84 Text Interpretation: Normal sinus rhythm Cannot rule out Anterior infarct , age undetermined ST & T wave abnormality, consider inferolateral ischemia Abnormal ECG No significant change since last tracing Confirmed by Pryor Curia [phone]) on 04/18/2019 1:19:00 AM   Radiology DG Chest 2 View  Result Date: 04/17/2019 CLINICAL DATA:  Shortness of breath EXAM: CHEST - 2 VIEW COMPARISON:  02/14/2019 FINDINGS: Mild cardiomegaly and vascular congestion. Diffuse interstitial prominence throughout the lungs could represent edema. There are bibasilar opacities which could reflect atelectasis or infiltrates. No visible effusions or acute bony abnormality. IMPRESSION: Cardiomegaly with vascular congestion and interstitial edema. Focal bibasilar airspace opacities could reflect atelectasis or pneumonia. Electronically Signed   By: Rolm Baptise M.D.   On: 04/17/2019 18:11    Procedures Procedures (including critical care time)  Medications Ordered in ED Medications  furosemide (LASIX) injection 80 mg (80 mg Intravenous Given 04/18/19 0218)  aspirin chewable tablet 324 mg (324 mg Oral Given 04/18/19 8588)    ED Course  I have reviewed the triage vital signs and the nursing notes.  Pertinent labs & imaging results that were available during my care of the patient were reviewed by me and considered in my medical decision making (see chart for details).    MDM Rules/Calculators/A&P  64 year old female here  with shortness of breath and lower extremity edema.  Has history of CHF with EF of about 30 to 35%.  She is followed closely by nephrology, Dr. Hollie Salk.  Lasix recently doubled but she has not had any increased urine output and continues to be very symptomatic.  States she is having to stop while trying to do simple things like go to the bathroom.  She is on baseline 4 L but has been using 5 at rest or more with exertional activities.  States she has been told to come in for IV diuresis.  She is afebrile and nontoxic but does appear to have increased work of breathing and seems very winded during conversation.  She does have 3+ pitting edema from the feet all the way to the knees and rales consistent with fluid overload.  Labs as above, elevated BNP and chest x-ray with pulmonary edema.  She did report episode of chest pain while in the waiting room but that has resolved at this time.  Troponin was sent.  She was given 80 mg IV Lasix as well as 325 aspirin pending troponin.  Covid screen also sent, she already had Covid in 2020.  She is not having any current cough, fever, or other infectious symptoms.  We will plan for admission, patient is agreeable.  Discussed with hospitalist, Dr. Marlowe Sax-- will admit for ongoing care.  Final Clinical Impression(s) / ED Diagnoses Final diagnoses:  Acute pulmonary edema ([place])  Leg edema    Rx / DC Orders ED Discharge Orders    None       Larene Pickett, PA-C 04/18/19 0240    Ward, Delice Bison, DO 04/18/19 [phone]

## 2019-04-18 NOTE — Consult Note (Addendum)
Cardiology Consultation:   Patient ID: Kara Hanson MRN: [id number]; DOB: [birth year]  Admit date: 04/17/2019 Date of Consult: 04/18/2019  Primary Care Provider: [name], MD Primary Cardiologist: Candee Furbish, MD  Primary Electrophysiologist:  None    Patient Profile:   Kara Hanson is a 63 y.o. female with a hx of Takotsubo's dilated cardiomyopathy in 2018, diabetes, hypertension, hyperlipidemia, AKI, anemia, Covid infection in April 2020, NICM, recent admission for acute heart failure in November 2020 who is being seen today for the evaluation of acute heart failure at the request of Dr. Roger Shelter.  History of Present Illness:   Kara Hanson is followed by Dr. Marlou Porch for the above cardiac history.  She was first seen during a hospitalization in 2018 for syncope with sharp chest pain.  Echo showed EF of 30 to 35% and therefore underwent cardiac catheterization which showed normal coronary arteries interestingly and normal LV function, EF 50 to 55%.  Initial low EF was felt to be stress-induced cardiomyopathy and she was started on a beta-blocker and ARB.  Repeat echo 12/05/2016 showed improved EF of 55 to 60% with grade 2 diastolic dysfunction and normal wall motion.  Echo and 11/01/2018 showed EF 50% mild AS with mean gradient of 12mHg.   Patient was admitted in 07/2018 for pneumonia due to Covid.  Admission 09/22/2018 with acute on chronic diastolic CHF with significant edema.  BNP 433.  Was diuresed and discharged.  Admitted 11/22/2018 for acute hypoxic respiratory failure due to CAP and mild acute heart failure.   Patient was admitted in October 2020 for acute heart failure.  Echo showed EF of 40 to 45% with global hypokinesis moderate LVH and grade 2 diastolic dysfunction.  Admission was complicated by AKI.    Patient was admitted on 02/09/2019 for Acute CHF and new low EF, 30-35%. Cardiac cath showed mild nonobstructive CAD  Medical therapy was recommended.  Echo showed  grade  3 diastolic dysfunction.  Nephrology was on board to help with diuretics and fluids.  She was not seen in follow-up.   Patient presented to the ED 04/18/2019 for shortness of breath.  Patient said symptoms started about a week ago. Reports progressive dyspnea on exertion. She often would stop and catch her breath while walking around her home. Also reported orthopnea and worsening lower leg edema. She is on 4 L O2 at baseline but recently had been requiring 5L. She was taking lasix 80 mg daily. She saw her nephrologist yesterday who increased her Lasix to 80 twice daily has not felt this is helped.  She feels she is unable to urinate (has the urge but cannot go). Denies dietary noncompliance or increased fluid intake. No chest pain, fever, chills, recent illness.   The ED BP 161/75, pulse 80, afebrile, respiratory rate 18, 98% O2.  Exam 3+ pitting edema was noted along with labored breathing.  Labs showed potassium 3.9, glucose 184, creatinine 1.69, BUN 41, GFR 37.  WBC 5.3, hemoglobin 9.2.  BNP 592. HS troponin 18 > 15.  A1c 7.4.  Chest x-ray showed cardiomegaly with vascular congestion and interstitial edema and atelectasis versus pneumonia.  EKG shows normal sinus rhythm with nonspecific T wave changes inferior leads.   Heart Pathway Score:     Past Medical History:  Diagnosis Date  . [place] ([place]ive [place]   . [place]   . Diabetes mellitus without complication ([place]   . Hypercholesteremia   . Hypertension   . Myocardial infarction ([place]   .  Pneumonia 11/01/2018  . Spinal stenosis     Past Surgical History:  Procedure Laterality Date  . BIOPSY  01/27/2019   Procedure: BIOPSY;  Surgeon: Otis Brace, MD;  Location: [facility];  Service: Gastroenterology;;  . BLADDER SURGERY    . CARDIAC CATHETERIZATION N/A 04/25/2016   Procedure: Left Heart Cath and Coronary Angiography;  Surgeon: Lorretta Harp, MD;  Location: [facility];   Service: Cardiovascular;  Laterality: N/A;  . CARDIAC CATHETERIZATION  02/11/2019  . CESAREAN SECTION    . CHOLECYSTECTOMY    . ESOPHAGOGASTRODUODENOSCOPY (EGD) WITH PROPOFOL N/A 01/27/2019   Procedure: ESOPHAGOGASTRODUODENOSCOPY (EGD) WITH PROPOFOL;  Surgeon: Otis Brace, MD;  Location: [facility];  Service: Gastroenterology;  Laterality: N/A;  . RIGHT/LEFT HEART CATH AND CORONARY ANGIOGRAPHY N/A 02/11/2019   Procedure: RIGHT/LEFT HEART CATH AND CORONARY ANGIOGRAPHY;  Surgeon: Jolaine Artist, MD;  Location: [facility];  Service: Cardiovascular;  Laterality: N/A;  . VIDEO BRONCHOSCOPY N/A 02/01/2019   Procedure: VIDEO BRONCHOSCOPY WITHOUT FLUORO;  Surgeon: Candee Furbish, MD;  Location: [facility];  Service: Endoscopy;  Laterality: N/A;     Home Medications:  Prior to Admission medications   Medication Sig Start Date End Date Taking? Authorizing Provider  allopurinol (ZYLOPRIM) 300 MG tablet Take 300 mg by mouth daily.   Yes [provider]  aspirin EC 81 MG tablet Take 81 mg by mouth daily.   Yes [provider]  atorvastatin (LIPITOR) 80 MG tablet Take 80 mg by mouth daily at 6 PM.   Yes [provider]  carvedilol (COREG) 25 MG tablet Take 1 tablet (25 mg total) by mouth 2 (two) times daily with a meal. 02/27/19  Yes Newlin, Enobong, MD  DULoxetine (CYMBALTA) 30 MG capsule Take 1 capsule (30 mg total) by mouth daily. 02/06/19  Yes Dana Allan I, MD  furosemide (LASIX) 80 MG tablet Take 80 mg by mouth 2 (two) times daily.   Yes [provider]  gabapentin (NEURONTIN) 100 MG capsule Take 1 capsule (100 mg total) by mouth 2 (two) times daily. 02/27/19  Yes [name], MD  insulin aspart (NOVOLOG) 100 UNIT/ML injection 0 to 12 units subcutaneously 3 times daily before meals as per sliding scale 02/27/19  Yes Newlin, Enobong, MD  Insulin Glargine (BASAGLAR KWIKPEN) 100 UNIT/ML SOPN Inject 0.35 mLs (35 Units total) into the skin  at bedtime. 02/27/19  Yes [name], MD  lansoprazole (PREVACID) 15 MG capsule Take 15 mg by mouth daily.  10/02/18  Yes [provider]  metoCLOPramide (REGLAN) 5 MG tablet Take 1 tablet (5 mg total) by mouth 3 (three) times daily. 02/05/19 02/05/20 Yes Bonnell Public, MD  Multiple Vitamins-Minerals (MULTIVITAMIN WITH MINERALS) tablet Take 1 tablet by mouth daily.   Yes [provider]  polyethylene glycol (MIRALAX / GLYCOLAX) 17 g packet Take 17 g by mouth daily. Patient taking differently: Take 17 g by mouth daily as needed for moderate constipation.  10/02/18  Yes Bonnielee Haff, MD  SUPER B COMPLEX/C CAPS Take 1 capsule by mouth daily.   Yes [provider]  Accu-Chek FastClix Lancets MISC USE AS DIRECTED TO TEST BLOOD SUGAR THREE TIMES DAILY 07/31/18   [name], MD  Blood Glucose Monitoring Suppl (ACCU-CHEK GUIDE) w/Device KIT 1 each by Does not apply route 3 (three) times daily. 07/31/18   [name], MD  EASY COMFORT PEN NEEDLES 31G X 5 MM MISC USE FOUR TIMES PER DAY FOR INSULIN ADMINISTRATION Patient taking  differently: 4 (four) times daily.  10/02/18   [name], MD  glucose blood (ACCU-CHEK GUIDE) test strip USE AS DIRECTED TO TEST BLOOD SUGAR THREE TIMES DAILY 07/31/18   [name], MD  Lancet Device MISC Use as instructed 3 times daily 09/19/17   Renita Papa, PA-C  Misc. Devices MISC Portable oxygen concentrator.  Diagnosis-chronic respiratory failure. 09/03/18   [name], MD  Misc. Devices MISC Rollaor with seat. Dx: Congestive Heart Failure 10/19/18   [name], MD  sodium chloride (OCEAN) 0.65 % SOLN nasal spray Place 1 spray into both nostrils as needed for congestion.    [provider]    Inpatient Medications: Scheduled Meds: . allopurinol  300 mg Oral Daily  . aspirin EC  81 mg Oral Daily  . atorvastatin  80 mg Oral q1800  . B-complex with vitamin C  1 tablet Oral Daily  . carvedilol  25 mg Oral BID  WC  . DULoxetine  30 mg Oral Daily  . enoxaparin (LOVENOX) injection  40 mg Subcutaneous Daily  . furosemide  80 mg Intravenous BID  . gabapentin  100 mg Oral BID  . insulin aspart  0-9 Units Subcutaneous TID WC  . insulin glargine  35 Units Subcutaneous QHS  . metoCLOPramide  5 mg Oral TID WC  . multivitamin with minerals  1 tablet Oral Daily  . pantoprazole  20 mg Oral Daily  . sodium chloride flush  3 mL Intravenous Q12H   Continuous Infusions: . sodium chloride     PRN Meds: sodium chloride, acetaminophen, polyethylene glycol, sodium chloride, sodium chloride flush  Allergies:    Allergies  Allergen Reactions  . Garlic Shortness Of Breath, Itching and Swelling    Raw*  Hand itching and swelling  . Latex Itching  . Morphine And Related Itching and Other (See Comments)    Headache   . Other Itching    Reaction to newspaper ink -itching and headache    Social History:   Social History   Socioeconomic History  . Marital status: Widowed    Spouse name: Not on file  . Number of children: Not on file  . Years of education: Not on file  . Highest education level: Not on file  Occupational History  . Not on file  Tobacco Use  . Smoking status: Never Smoker  . Smokeless tobacco: Never Used  Substance and Sexual Activity  . Alcohol use: No  . Drug use: No  . Sexual activity: Not Currently    Birth control/protection: None  Other Topics Concern  . Not on file  Social History Narrative  . Not on file   Social Determinants of Health   Financial Resource Strain:   . Difficulty of Paying Living Expenses: Not on file  Food Insecurity:   . Worried About Charity fundraiser in the Last Year: Not on file  . Ran Out of Food in the Last Year: Not on file  Transportation Needs:   . Lack of Transportation (Medical): Not on file  . Lack of Transportation (Non-Medical): Not on file  Physical Activity:   . Days of Exercise per Week: Not on file  . Minutes of Exercise per  Session: Not on file  Stress:   . Feeling of Stress : Not on file  Social Connections:   . Frequency of Communication with Friends and Family: Not on file  . Frequency of Social Gatherings with Friends and Family: Not on file  . Attends Religious  Services: Not on file  . Active Member of Clubs or Organizations: Not on file  . Attends Archivist Meetings: Not on file  . Marital Status: Not on file  Intimate Partner Violence:   . Fear of Current or Ex-Partner: Not on file  . Emotionally Abused: Not on file  . Physically Abused: Not on file  . Sexually Abused: Not on file    Family History:   Family History  Problem Relation Age of Onset  . Diabetes Mellitus II Father   . Stroke Father   . Healthy Mother        She is 20 years old.      ROS:  Please see the history of present illness.  All other ROS reviewed and negative.     Physical Exam/Data:   Vitals:   04/18/19 0405 04/18/19 0842 04/18/19 1109 04/18/19 1159  BP: (!) 165/75 (!) 88/76  115/66  Pulse:    64  Resp:    20  Temp: 98.4 F (36.9 C) 98.4 F (36.9 C) 97.8 F (36.6 C) 98.3 F (36.8 C)  TempSrc: Oral Oral Oral Oral  SpO2:    99%  Weight: 78 kg     Height: _0  (1.499 m)       Intake/Output Summary (Last 24 hours) at 04/18/2019 1251 Last data filed at 04/18/2019 1000 Gross per 24 hour  Intake 123 ml  Output 0 ml  Net 123 ml   Last 3 Weights 04/18/2019 02/27/2019 02/19/2019  Weight (lbs) 171 lb 15.3 oz 155 lb 9.6 oz 155 lb 8 oz  Weight (kg) 78 kg 70.58 kg 70.534 kg     Body mass index is 34.73 kg/m.  General:  Well nourished, well developed, in no acute distress HEENT: normal Lymph: no adenopathy Neck: + JVD Endocrine:  No thryomegaly Vascular: No carotid bruits; FA pulses 2+ bilaterally without bruits  Cardiac:  normal S1, S2; RRR; no murmur  Lungs:  + crackles at bases; 4L O2 Abd: soft, nontender, no hepatomegaly  Ext: 2+ edema B/L Musculoskeletal:  No deformities, BUE and BLE  strength normal and equal Skin: warm and dry  Neuro:  CNs 2-12 intact, no focal abnormalities noted Psych:  Normal affect   EKG:  The EKG was personally reviewed and demonstrates:  NSR, 78 bpm, ant q waves anteriorly and inferior leads; nonspecific T wave abnormalities inferior leads Telemetry:  Telemetry was personally reviewed and demonstrates:  NSR HR 60-70s  Relevant CV Studies:  Cardiac Cath 02/11/2019  Prox RCA lesion is 30% stenosed.  Prox Cx lesion is 40% stenosed with 60% stenosed side branch in 1st Mrg.  Mid LAD lesion is 30% stenosed.  Dist LAD lesion is 30% stenosed.   Findings:  Ao = 131/59 (85) LV =  133/22 RA = 12 RV = 49/13 PA =  48/21 (33) PCW = 26 (v = 33) Fick cardiac output/index = 6.4/3.4 PVR =1.3 WU FA sat = 98 % PA sat = 62%, 63% RA/PCW = 0.46  Assessment: 1. Mild non-obstructive CAD with small coronary arteries 2. Moderate to severe NICM with EF 30% by echo 3. No significant aortic valve gradient on pullback  4. Elevated filling pressures with normal cardiac output  Plan/Discussion:  Medical therapy.    Echo 02/10/2019   1. Left ventricular ejection fraction, by visual estimation, is 30 to 35%. The left ventricle has moderate to severely decreased function. Left ventricular septal wall thickness was normal. There is no left ventricular hypertrophy.  2. Left ventricular diastolic parameters are consistent with Grade III diastolic dysfunction (restrictive).  3. Moderately dilated left ventricular internal cavity size.  4. Septal apical and inferior wall hypokinesis.  5. Global right ventricle has normal systolic function.The right ventricular size is normal. No increase in right ventricular wall thickness.  6. Left atrial size was not well visualized.  7. Right atrial size was not well visualized.  8. Left pleural effusion.  9. The mitral valve is normal in structure. Mild mitral valve regurgitation. 10. The tricuspid valve is normal  in structure. Tricuspid valve regurgitation moderate. 11. The aortic valve is tricuspid. Aortic valve regurgitation is not visualized. Mild aortic valve stenosis. 12. Limited study no AVA or mean gradient obtained Peak velocity in 87msec range with decreased EF likely mild / moderate AS Consider f/u doppler for full evaluation. 13. The pulmonic valve was not well visualized. Pulmonic valve regurgitation is mild. 14. The aortic root was not well visualized. 15. The inferior vena cava is normal in size with greater than 50% respiratory variability, suggesting right atrial pressure of 3 mmHg. 16. The interatrial septum was not well visualized.   Laboratory Data:  High Sensitivity Troponin:   Recent Labs  Lab 04/18/19 0202 04/18/19 0700  TROPONINIHS 18* 15     Chemistry Recent Labs  Lab 04/17/19 1722  NA 140  K 3.9  CL 103  CO2 26  GLUCOSE 184*  BUN 41*  CREATININE 1.69*  CALCIUM 9.5  GFRNONAA 32*  GFRAA 37*  ANIONGAP 11    No results for input(s): PROT, ALBUMIN, AST, ALT, ALKPHOS, BILITOT in the last 168 hours. Hematology Recent Labs  Lab 04/17/19 1722  WBC 5.3  RBC 3.35*  HGB 9.2*  HCT 30.5*  MCV 91.0  MCH 27.5  MCHC 30.2  RDW 15.8*  PLT 267   BNP Recent Labs  Lab 04/17/19 1722 04/18/19 0700  BNP 592.8* 680.9*    DDimer No results for input(s): DDIMER in the last 168 hours.   Radiology/Studies:  DG Chest 2 View  Result Date: 04/17/2019 CLINICAL DATA:  Shortness of breath EXAM: CHEST - 2 VIEW COMPARISON:  02/14/2019 FINDINGS: Mild cardiomegaly and vascular congestion. Diffuse interstitial prominence throughout the lungs could represent edema. There are bibasilar opacities which could reflect atelectasis or infiltrates. No visible effusions or acute bony abnormality. IMPRESSION: Cardiomegaly with vascular congestion and interstitial edema. Focal bibasilar airspace opacities could reflect atelectasis or pneumonia. Electronically Signed   By: KRolm BaptiseM.D.    On: 04/17/2019 18:11     Assessment and Plan:   Acute combined systolic and diastolic CHF/NICM Presented with progressive DOE, orthopnea, and lower leg edema.  BNP elevated at 592.  Chest x-ray with vascular congestion and interstitial edema.  Patient is on Lasix 80 mg daily at home and yesterday increased it to Lasix 80 twice daily with no relief. -Started on IV Lasix 80 twice daily >>continue -Echo in November 2020 showed EF of 30 to 35% with grade 3 diastolic dysfunction.  Cath at that time showed nonobstructive CAD -Continue Coreg 25 mg BID -Daily weights. Patient says her baseline weight is around 150 lbs. Weight on admission was 171 lbs. Discharge weight on her last admission (02/19/2019 was 158 lbs. - creatinine 1.69. Labs pending -Strict I's and O's -No ACE/ARB due to renal insufficiency - Can consider addition of Bidil  - With multiple admissions for CHF this year consider Advanced HF consult  Acute on chronic hypoxic respiratory failure -In the setting of  decompensated CHF - she is back on her home O2 4L  Mild Troponin Elevation - HS troponin 18 > 15 - trend is flat, not consistent with ACS - Cardiac catheterization in November 2020 showed mild nonobstructive CAD - No further work-up at this time -Continue aspirin  CKD stage III -Creatinine 1.6 appears to be her baseline -Follow with IV diuresis  Diabetes type 2 -SSI per IM  Hyperlipidemia -Continue statin  For questions or updates, please contact [facility] Please consult [email or web address] for contact info under     Signed, Cadence Ninfa Meeker, PA-C  04/18/2019 12:51 PM   Patient seen and examined with Cadence Ninfa Meeker, PA-C .  Agree as above, with the following exceptions and changes as noted below.  The patient presents with recurrent lower extremity swelling, dyspnea on exertion, mild chest discomfort, weight gain, features of decompensated heart failure she has had several admissions this year, and has had  serial echocardiograms demonstrating worsening of ejection fraction without evidence of obstructive CAD she tells me that her Lasix is adjusted when she becomes very short of breath.  She does not weigh herself at home.  She follows a low-salt diet and her daughter does the cooking for her she tells me she only drinks 3 cups of water or liquids per day and is having decreased urine output. Gen: NAD, CV: RRR, 1/6 systolic murmur, JVD elevated to the mid one third of the neck, lungs: Bibasilar crackles, Abd: soft, Extrem: Warm, well perfused, 1-2+ bilateral edema to the knee, neuro/Psych: alert and oriented x 3, normal mood and affect. All available labs, radiology testing, previous records reviewed.  It is unclear why she is having recurrent episodes of heart failure.  She does not weigh herself at home, and I wonder if she would do well on a home Lasix titration based on weights.  We should inquire with our [facility] and vascular center social worker if we can obtain a scale for her through the patient care fund.  For now continue IV Lasix as she clearly is volume overloaded.  She had 2 missed follow-up appointments in cardiology in December.  She was intended to have an echocardiogram 3 months after her last, so we will obtain an echocardiogram now to evaluate ejection fraction possible need for defibrillator if ejection fraction remains reduced.  Unable to use Entresto due to renal failure.  Though her echocardiogram does not seem characteristic for cardiac amyloidosis, with her pattern of restrictive filling on echo and chronic heart failure symptoms, I would like to obtain a myeloma panel for completeness of work-up.  Pending results of echocardiography we will seek the opinion of advanced heart failure or electrophysiology as needed.  Elouise Munroe 04/18/19 7:13 PM

## 2019-04-19 ENCOUNTER — Inpatient Hospital Stay (HOSPITAL_COMMUNITY): Payer: Medicare Other

## 2019-04-19 DIAGNOSIS — I350 Nonrheumatic aortic (valve) stenosis: Secondary | ICD-10-CM

## 2019-04-19 DIAGNOSIS — M1A00X0 Idiopathic chronic gout, unspecified site, without tophus (tophi): Secondary | ICD-10-CM

## 2019-04-19 DIAGNOSIS — I361 Nonrheumatic tricuspid (valve) insufficiency: Secondary | ICD-10-CM

## 2019-04-19 LAB — BASIC METABOLIC PANEL
Anion gap: 8 (ref 5–15)
BUN: 42 mg/dL — ABNORMAL HIGH (ref 8–23)
CO2: 28 mmol/L (ref 22–32)
Calcium: 9.1 mg/dL (ref 8.9–10.3)
Chloride: 103 mmol/L (ref 98–111)
Creatinine, Ser: 1.88 mg/dL — ABNORMAL HIGH (ref 0.44–1.00)
GFR calc Af Amer: 32 mL/min — ABNORMAL LOW (ref >60)
GFR calc non Af Amer: 28 mL/min — ABNORMAL LOW (ref >60)
Glucose, Bld: 79 mg/dL (ref 70–99)
Potassium: 3.5 mmol/L (ref 3.5–5.1)
Sodium: 139 mmol/L (ref 135–145)

## 2019-04-19 LAB — C DIFFICILE QUICK SCREEN W PCR REFLEX
C Diff antigen: POSITIVE — AB
C Diff toxin: NEGATIVE

## 2019-04-19 LAB — GLUCOSE, CAPILLARY
Glucose-Capillary: 59 mg/dL — ABNORMAL LOW (ref 70–99)
Glucose-Capillary: 110 mg/dL — ABNORMAL HIGH (ref 70–99)
Glucose-Capillary: 124 mg/dL — ABNORMAL HIGH (ref 70–99)
Glucose-Capillary: 59 mg/dL — ABNORMAL LOW (ref 70–99)
Glucose-Capillary: 131 mg/dL — ABNORMAL HIGH (ref 70–99)
Glucose-Capillary: 87 mg/dL (ref 70–99)
Glucose-Capillary: 95 mg/dL (ref 70–99)

## 2019-04-19 LAB — CBC
HCT: 25.8 % — ABNORMAL LOW (ref 36.0–46.0)
Hemoglobin: 7.9 g/dL — ABNORMAL LOW (ref 12.0–15.0)
MCH: 27.4 pg (ref 26.0–34.0)
MCHC: 30.6 g/dL (ref 30.0–36.0)
MCV: 89.6 fL (ref 80.0–100.0)
Platelets: 242 10*3/uL (ref 150–400)
RBC: 2.88 MIL/uL — ABNORMAL LOW (ref 3.87–5.11)
RDW: 15.6 % — ABNORMAL HIGH (ref 11.5–15.5)
WBC: 6.3 10*3/uL (ref 4.0–10.5)
nRBC: 0 % (ref 0.0–0.2)

## 2019-04-19 LAB — IRON AND TIBC
Iron: 24 ug/dL — ABNORMAL LOW (ref 28–170)
Saturation Ratios: 11 % (ref 10.4–31.8)
TIBC: 223 ug/dL — ABNORMAL LOW (ref 250–450)
UIBC: 199 ug/dL

## 2019-04-19 LAB — OCCULT BLOOD X 1 CARD TO LAB, STOOL: Fecal Occult Bld: NEGATIVE

## 2019-04-19 LAB — FERRITIN: Ferritin: 43 ng/mL (ref 11–307)

## 2019-04-19 LAB — ECHOCARDIOGRAM LIMITED
Height: 59 in
Weight: 2532.64 oz

## 2019-04-19 LAB — CLOSTRIDIUM DIFFICILE BY PCR, REFLEXED: Toxigenic C. Difficile by PCR: NEGATIVE

## 2019-04-19 LAB — BRAIN NATRIURETIC PEPTIDE: B Natriuretic Peptide: 465.8 pg/mL — ABNORMAL HIGH (ref 0.0–100.0)

## 2019-04-19 MED ORDER — DARBEPOETIN ALFA 100 MCG/0.5ML IJ SOSY
100.0000 ug | PREFILLED_SYRINGE | INTRAMUSCULAR | Status: DC
  Filled 2019-04-19: qty 0.5

## 2019-04-19 MED ORDER — SODIUM CHLORIDE 0.9 % IV SOLN
510.0000 mg | Freq: Once | INTRAVENOUS | Status: AC
  Administered 2019-04-19: 510 mg via INTRAVENOUS
  Filled 2019-04-19: qty 17

## 2019-04-19 NOTE — Progress Notes (Signed)
Hypoglycemic Event  CBG: 59  Treatment: Gave patient 12oz of Orange juice; blood sugar rechecked 87; gave another 12oz OJ.  Symptoms: pt not dizzy or lightheaded; answers questions appropriately. Alert and oriented. Pt ate dinner tray rechecked Bp 131.  Follow-up CBG: [id number] CBG Result:87   Possible Reasons for Event: Patient has not eating due to stating "everytime she eats goes right through."  Comments/MD notified:    Eulis Manly

## 2019-04-19 NOTE — Progress Notes (Signed)
Progress Note  Patient Name: Kara Hanson Date of Encounter: 04/19/2019  Primary Cardiologist: Candee Furbish, MD   Subjective   Feels better from volume standpoint, very sleepy  Inpatient Medications    Scheduled Meds: . allopurinol  300 mg Oral Daily  . aspirin EC  81 mg Oral Daily  . atorvastatin  80 mg Oral q1800  . B-complex with vitamin C  1 tablet Oral Daily  . carvedilol  25 mg Oral BID WC  . DULoxetine  30 mg Oral Daily  . enoxaparin (LOVENOX) injection  40 mg Subcutaneous Daily  . furosemide  80 mg Intravenous BID  . gabapentin  100 mg Oral BID  . insulin aspart  0-9 Units Subcutaneous TID WC  . insulin glargine  35 Units Subcutaneous QHS  . metoCLOPramide  5 mg Oral TID WC  . multivitamin with minerals  1 tablet Oral Daily  . sodium chloride flush  3 mL Intravenous Q12H   Continuous Infusions: . sodium chloride     PRN Meds: sodium chloride, acetaminophen, loperamide, sodium chloride, sodium chloride flush   Vital Signs    Vitals:   04/19/19 0355 04/19/19 0647 04/19/19 0736 04/19/19 1110  BP: 127/61  (!) 110/49 124/65  Pulse: 70  73 65  Resp: 18  15 20   Temp: 98 F (36.7 C)  98.2 F (36.8 C) 98 F (36.7 C)  TempSrc: Oral  Oral Oral  SpO2: 97%  100% 100%  Weight:  71.8 kg    Height:        Intake/Output Summary (Last 24 hours) at 04/19/2019 1432 Last data filed at 04/19/2019 0646 Gross per 24 hour  Intake 600 ml  Output 150 ml  Net 450 ml   Last 3 Weights 04/19/2019 04/18/2019 02/27/2019  Weight (lbs) 158 lb 4.6 oz 171 lb 15.3 oz 155 lb 9.6 oz  Weight (kg) 71.8 kg 78 kg 70.58 kg      Telemetry    SR pvcs - Personally Reviewed  ECG    No new - Personally Reviewed  Physical Exam   GEN: No acute distress.   Neck: challenging to assess today, patient reclined to 10 degrees and appears at the mid 1/3 of the neck. Cardiac: RRR, no murmurs, rubs, or gallops.  Respiratory: bibasilar crackles. GI: Soft, nontender, non-distended  MS: trace  to 1+edema bilaterally; No deformity. Neuro:  Nonfocal  Psych: Normal affect   Labs    High Sensitivity Troponin:   Recent Labs  Lab 04/18/19 0202 04/18/19 0700  TROPONINIHS 18* 15      Chemistry Recent Labs  Lab 04/17/19 1722 04/18/19 1406 04/19/19 0216  NA 140 141 139  K 3.9 3.8 3.5  CL 103 105 103  CO2 26 26 28   GLUCOSE 184* 144* 79  BUN 41* 42* 42*  CREATININE 1.69* 1.73* 1.88*  CALCIUM 9.5 9.2 9.1  GFRNONAA 32* 31* 28*  GFRAA 37* 36* 32*  ANIONGAP 11 10 8      Hematology Recent Labs  Lab 04/17/19 1722 04/19/19 0216  WBC 5.3 6.3  RBC 3.35* 2.88*  HGB 9.2* 7.9*  HCT 30.5* 25.8*  MCV 91.0 89.6  MCH 27.5 27.4  MCHC 30.2 30.6  RDW 15.8* 15.6*  PLT 267 242    BNP Recent Labs  Lab 04/17/19 1722 04/18/19 0700 04/19/19 0216  BNP 592.8* 680.9* 465.8*     DDimer No results for input(s): DDIMER in the last 168 hours.   Radiology    DG Chest 2 View  Result Date: 04/17/2019 CLINICAL DATA:  Shortness of breath EXAM: CHEST - 2 VIEW COMPARISON:  02/14/2019 FINDINGS: Mild cardiomegaly and vascular congestion. Diffuse interstitial prominence throughout the lungs could represent edema. There are bibasilar opacities which could reflect atelectasis or infiltrates. No visible effusions or acute bony abnormality. IMPRESSION: Cardiomegaly with vascular congestion and interstitial edema. Focal bibasilar airspace opacities could reflect atelectasis or pneumonia. Electronically Signed   By: Rolm Baptise M.D.   On: 04/17/2019 18:11   ECHOCARDIOGRAM LIMITED  Result Date: 04/19/2019   ECHOCARDIOGRAM LIMITED REPORT   Patient Name:   Kara Hanson Date of Exam: 04/19/2019 Medical Rec #:  [id number]       Height:       59.0 in Accession #:    [id number]      Weight:       158.3 lb Date of Birth:  [birth year]       BSA:          1.67 m Patient Age:    64 years        BP:           110/49 mmHg Patient Gender: F               HR:           70 bpm. Exam Location:  Inpatient   Procedure: Limited Echo, 3D Echo, Color Doppler and Cardiac Doppler Indications:    E33.29 Acute diastolic (congestive) heart failure  History:        Patient has prior history of Echocardiogram examinations, most                 recent 02/11/2019. COVID+ 09/24/18.  Sonographer:    Raquel Sarna Senior RDCS Sonographer#2:  Clayton Lefort RDCS (AE) Referring Phys: [id number] [place]  1. Left ventricular ejection fraction, by visual estimation, is 40 to 45%. The left ventricle has mild to moderately decreased function. There is mildly increased left ventricular wall thickness.  2. The left ventricle demonstrates global hypokinesis.  3. Global right ventricle has normal systolc function.The right ventricular size is normal.  4. Aortic stenosis was incompletely evaluated but likely mild aortic valve stenosis (MG 76mmHg, AVA 1.3 cm^2, DI 0.45)  5. The inferior vena cava is normal in size with <50% respiratory variability, suggesting right atrial pressure of 8 mmHg. FINDINGS  Left Ventricle: Left ventricular ejection fraction, by visual estimation, is 40 to 45%. The left ventricle has mild to moderately decreased function. The left ventricle demonstrates global hypokinesis. There is mildly increased left ventricular wall thickness. Right Ventricle: The right ventricular size is normal. No increase in right ventricular wall thickness. Global RV systolic function is has normal systolic function. Pericardium: There is no evidence of pericardial effusion is seen. There is no evidence of pericardial effusion. Mitral Valve: The mitral valve is normal in structure. Trivial mitral valve regurgitation. Tricuspid Valve: The tricuspid valve is normal in structure. Tricuspid valve regurgitation is mild. Aortic Valve: The aortic valve was not well visualized. Aortic valve regurgitation is not visualized. Mild aortic stenosis is present. Aortic valve mean gradient measures 8.0 mmHg. Aortic valve peak gradient measures 13.2 mmHg.  Aortic valve area, by VTI measures 1.28 cm. Aorta: The aortic root is normal in size and structure. Venous: The inferior vena cava is normal in size with less than 50% respiratory variability, suggesting right atrial pressure of 8 mmHg.  LEFT VENTRICLE          Normals PLAX 2D LVIDd:  5.20 cm  3.6 cm LVIDs:         3.90 cm  1.7 cm LV PW:         1.10 cm  1.4 cm LV IVS:        0.80 cm  1.3 cm LVOT diam:     1.90 cm  2.0 cm       3D Volume EF: LV SV:         64 ml    79 ml        3D EF:        43 % LV SV Index:   36.12    45 ml/m2     LV EDV:       193 ml LVOT Area:     2.84 cm 3.14 cm2     LV ESV:       110 ml                                      LV SV:        83 ml  LV Volumes (MOD)             Normals LV area d, A2C:    34.30 cm LV area d, A4C:    31.40 cm LV area s, A2C:    23.80 cm LV area s, A4C:    21.80 cm LV major d, A2C:   8.17 cm LV major d, A4C:   7.84 cm LV major s, A2C:   7.30 cm LV major s, A4C:   6.63 cm LV vol d, MOD A2C: 120.0 ml  68 ml LV vol d, MOD A4C: 105.0 ml LV vol s, MOD A2C: 66.2 ml   24 ml LV vol s, MOD A4C: 59.4 ml LV SV MOD A2C:     53.8 ml LV SV MOD A4C:     105.0 ml LV SV MOD BP:      48.5 ml   45 ml RIGHT VENTRICLE             IVC RV S prime:     11.30 cm/s  IVC diam: 1.90 cm TAPSE (M-mode): 2.0 cm LEFT ATRIUM         Index LA diam:    3.40 cm 2.04 cm/m  AORTIC VALVE                    Normals AV Area (Vmax):    1.19 cm AV Area (Vmean):   1.17 cm     3.06 cm2 AV Area (VTI):     1.28 cm AV Vmax:           182.00 cm/s AV Vmean:          138.000 cm/s 77 cm/s AV VTI:            0.420 m      3.15 cm2 AV Peak Grad:      13.2 mmHg AV Mean Grad:      8.0 mmHg     3 mmHg LVOT Vmax:         76.40 cm/s LVOT Vmean:        57.100 cm/s  75 cm/s LVOT VTI:          0.190 m      25.3 cm LVOT/AV VTI ratio: 0.45         1  AORTA  Normals Ao Root diam: 2.30 cm 31 mm  SHUNTS Systemic VTI:  0.19 m Systemic Diam: 1.90 cm  Oswaldo Milian MD Electronically signed by  Oswaldo Milian MD Signature Date/Time: 04/19/2019/11:11:07 AMThe mitral valve is normal in structure.    Final     Cardiac Studies   IMPRESSIONS    1. Left ventricular ejection fraction, by visual estimation, is 40 to 45%. The left ventricle has mild to moderately decreased function. There is mildly increased left ventricular wall thickness.  2. The left ventricle demonstrates global hypokinesis.  3. Global right ventricle has normal systolc function.The right ventricular size is normal.  4. Aortic stenosis was incompletely evaluated but likely mild aortic valve stenosis (MG 9mmHg, AVA 1.3 cm^2, DI 0.45)  5. The inferior vena cava is normal in size with <50% respiratory variability, suggesting right atrial pressure of 8 mmHg.  Patient Profile     Kara Hanson is a 64 y.o. female with a hx of Takotsubo's dilated cardiomyopathy in 2018, diabetes, hypertension, hyperlipidemia, AKI, anemia, Covid infection in April 2020, NICM, recent admission for acute heart failure in November 2020 who is being seen today for the evaluation of acute heart failure at the request of Dr. Roger Shelter.  Assessment & Plan    Acute combined systolic and diastolic CHF/NICM Presented with progressive DOE, orthopnea, and lower leg edema.  BNP elevated at 592.  Chest x-ray with vascular congestion and interstitial edema.  Patient is on Lasix 80 mg daily at home and increased it to Lasix 80 twice daily with no relief. -Started on IV Lasix 80 twice daily >>continue today and may need to reduced dose tomorrow due to renal function. - Cath at that time showed nonobstructive CAD -Continue Coreg 25 mg BID -Daily weights. Patient says her baseline weight is around 150 lbs. Weight on admission was 171 lbs. Discharge weight on her last admission (02/19/2019 was 158 lbs. - creatinine 1.88 -Strict I's and O's -No ACE/ARB due to renal insufficiency - With multiple admissions for CHF this year consider Advanced HF consult  as outpatient - myeloma panel pending for completeness of workup for recurrent HF admissions. -I have independently reviewed the echocardiogram images from her last hospitalization in November as well as the exam performed today.  LV function has not significantly improved, though may be slightly better today.  The ejection fraction on November his echocardiogram may be 35%, today closer to 40%.  Renal function permitting, she would be a good candidate for a cardiac MRI to better understand the nature of her nonischemic cardiomyopathy.  Acute on chronic hypoxic respiratory failure -In the setting of decompensated CHF  Mild Troponin Elevation - HS troponin 18 > 15 - trend is flat, not consistent with ACS - Cardiac catheterization in November 2020 showed mild nonobstructive CAD - No further work-up at this time -Continue aspirin  CKD stage III -Creatinine 1.6 appears to be her baseline -Follow with IV diuresis  Diabetes type 2 -SSI per IM  Hyperlipidemia -Continue statin      For questions or updates, please contact [facility] Please consult [email or web address] for contact info under        Signed, Elouise Munroe, MD  04/19/2019, 2:32 PM

## 2019-04-19 NOTE — Progress Notes (Signed)
PROGRESS NOTE    Patient: Kara Hanson                            PCP: [name], MD                    DOB: February 06, 1956            DOA: 04/17/2019 JOA:[id number]             DOS: 04/19/2019, 12:11 PM   LOS: 1 day   Date of Hanson: The patient was seen and examined on 04/19/2019  Subjective:   The patient was seen and examined this morning complaining of diarrhea x7 episodes overnight. Denies of having any chest pain.  Reporting that she is voiding well.  Continues to have swelling. Satting 100% on 4 L of oxygen this morning.  Brief Narrative:   Kara Hanson is a 64 y.o. female with PMH/o combined systolic and diastolic congestive heart failure, chronic respiratory failure on 4 L home oxygen, CVA, type 2 diabetes, hypertension, hyperlipidemia, CKD stage III, gout presenting with progressive shortness of breath and edema. Patient wound noted to be on acute on chronic s/d CHF exacerbation. Recently increase Lasix to 80 mg twice daily by nephrologist with no relief.  ED Course: Was hemodynamically stable on 5 L of oxygen, BNP elevated to 592, chest x-ray consistent with cardiomegaly, vascular congestion, intestinal edema SARS-CoV-2-negative (history of Covid infection last year)  04/18/2019 -remained to be in mild respiratory stress with 5 L of oxygen, satting Heart failure team consulted, nephrology consult -continue Lasix   04/19/2019 -the patient was seen and examined, remained to be on 4 L of oxygen, continue to have edema, on Lasix, was complaining of diarrhea 7 episodes overnight.  Assessment & Plan:   Principal Problem:   Acute exacerbation of CHF (congestive heart failure) (HCC) Active Problems:   Gout   Acute on chronic respiratory failure with hypoxia (HCC)   CKD (chronic kidney disease), stage III   Elevated troponin    Acute on chronic combined systolic and diastolic CHF/nonischemic cardiomyopathy -Remains on 4 L of oxygen, satting 100% this morning.  -Positive for JVD, bibasilar Rales, congestion, lower extremity edema, elevated proBNP 680 -Volume overload -Echo done 02/10/2019 with LVEF 30 to 35% and grade 3 diastolic dysfunction.   -Cath done 02/11/2019 showing mild nonobstructive CAD.  Patient presenting with complaints of  -Chest x-ray showing cardiomegaly with vascular congestion and interstitial edema.  Also showing focal bibasilar airspace opacities, likely atelectasis.   -Pneumonia less likely given no fever or leukocytosis.  SARS-CoV-2 PCR negative, however, patient already had Covid back in June 2020. -Cardiology following patient continues to be on, IV Lasix 80 twice daily -Daily weight, I's and O's monitoring -Continue home Coreg -low-sodium diet with fluid restriction -Cardiology/heart failure team consulted appreciate input  Acute on chronic hypoxic respiratory failure  -secondary to acutely decompensated CHF She was requiring additional oxygen at home.    Back to 4 L of oxygen, currently satting 100% -Continuous pulse ox -Continue supplemental oxygen -Continue to monitor closely, DuoNeb treatment and diuretics  Chest pain/ mild troponin elevation -Due to above, likely ischemic demand due to CHF exacerbation -Continue aspirin, as needed nitroglycerin and morphine, -Repeat EKG as needed -  Initial high-sensitivity troponin 18 >>15 -  Currently chest pain-free and appears comfortable.  - Recent cardiac cath showing mild nonobstructive CAD. -Cardiac monitoring   CKD stage III Stable.  Creatinine 1.6 at baseline-Monitor renal function with IV diuresis -Per patient's request nephrologist was consulted for further evaluation, creatinine elevated to 1.88  Acute on chronic anemia of chronic disease -Monitoring H&H acute drop noted 9.2 >> two 7.9, No report of bleeding per patient or nursing staff. Monitoring closely  Persistent diarrhea -7 episodes overnight -We will rule out C. difficile colitis, stool of pain  for C. difficile this a.m. -If negative continue as needed Imodium  Insulin-dependent diabetes mellitus -Check A1c.7.4   Sliding scale insulin sensitive before meals and CBG checks. -Continue home glargine at bedtime  Diabetic neuropathy -Continue gabapentin  Gout -Continue allopurinol  Hyperlipidemia -Continue Lipitor  Hypertension Blood pressure slightly elevated. -Continue Coreg -Lasix as above  DVT prophylaxis: Lovenox Code Status: Full code.  Discussed with the patient. Family Communication: No family at bedside. Disposition Plan: Anticipate discharge after clinical improvement. Consults called:  Heart failure team, nephrologist Primary nephrologist Dr. Melrose Nakayama   Procedures:   No admission procedures for hospital encounter.     Antimicrobials:  Anti-infectives (From admission, onward)   None       Medication:  . allopurinol  300 mg Oral Daily  . aspirin EC  81 mg Oral Daily  . atorvastatin  80 mg Oral q1800  . B-complex with vitamin C  1 tablet Oral Daily  . carvedilol  25 mg Oral BID WC  . DULoxetine  30 mg Oral Daily  . enoxaparin (LOVENOX) injection  40 mg Subcutaneous Daily  . furosemide  80 mg Intravenous BID  . gabapentin  100 mg Oral BID  . insulin aspart  0-9 Units Subcutaneous TID WC  . insulin glargine  35 Units Subcutaneous QHS  . metoCLOPramide  5 mg Oral TID WC  . multivitamin with minerals  1 tablet Oral Daily  . sodium chloride flush  3 mL Intravenous Q12H    sodium chloride, acetaminophen, loperamide, sodium chloride, sodium chloride flush   Objective:   Vitals:   04/19/19 0355 04/19/19 0647 04/19/19 0736 04/19/19 1110  BP: 127/61  (!) 110/49 124/65  Pulse: 70  73 65  Resp: 18  15 20   Temp: 98 F (36.7 C)  98.2 F (36.8 C) 98 F (36.7 C)  TempSrc: Oral  Oral Oral  SpO2: 97%  100% 100%  Weight:  71.8 kg    Height:        Intake/Output Summary (Last 24 hours) at 04/19/2019 1211 Last data filed at 04/19/2019 0646  Gross per 24 hour  Intake 600 ml  Output 150 ml  Net 450 ml   Filed Weights   04/18/19 0405 04/19/19 0647  Weight: 78 kg 71.8 kg     Examination:   BP 124/65 (BP Location: Left Arm)   Pulse 65   Temp 98 F (36.7 C) (Oral)   Resp 20   Ht 4\' 11"  (1.499 m)   Wt 71.8 kg   SpO2 100%   BMI 31.97 kg/m    Physical Exam  Constitution:  Alert, cooperative, no distress,  Psychiatric: Normal and stable mood and affect, cognition intact,   HEENT: Normocephalic, PERRL, otherwise with in Normal limits  Chest:Chest symmetric Cardio vascular:  S1/S2, RRR, No murmure, No Rubs or Gallops  pulmonary: Clear to auscultation bilaterally, respirations unlabored, negative wheezes / crackles Abdomen: Soft, non-tender, non-distended, bowel sounds,no masses, no organomegaly Muscular skeletal: Limited exam - in bed, able to move all 4 extremities, Normal strength,  Neuro: CNII-XII intact. , normal motor and sensation, reflexes intact  Extremities: +++ pitting edema lower extremities, +2 pulses  Skin: Dry, warm to touch, negative for any Rashes, No open wounds Wounds: per nursing documentation     LABs:  CBC Latest Ref Rng & Units 04/19/2019 04/17/2019 02/18/2019  WBC 4.0 - 10.5 K/uL 6.3 5.3 4.1  Hemoglobin 12.0 - 15.0 g/dL 7.9(L) 9.2(L) 7.9(L)  Hematocrit 36.0 - 46.0 % 25.8(L) 30.5(L) 25.9(L)  Platelets 150 - 400 K/uL 242 267 258   CMP Latest Ref Rng & Units 04/19/2019 04/18/2019 04/17/2019  Glucose 70 - 99 mg/dL 79 144(H) 184(H)  BUN 8 - 23 mg/dL 42(H) 42(H) 41(H)  Creatinine 0.44 - 1.00 mg/dL 1.88(H) 1.73(H) 1.69(H)  Sodium 135 - 145 mmol/L 139 141 140  Potassium 3.5 - 5.1 mmol/L 3.5 3.8 3.9  Chloride 98 - 111 mmol/L 103 105 103  CO2 22 - 32 mmol/L 28 26 26   Calcium 8.9 - 10.3 mg/dL 9.1 9.2 9.5  Total Protein 6.5 - 8.1 g/dL - - -  Total Bilirubin 0.3 - 1.2 mg/dL - - -  Alkaline Phos 38 - 126 U/L - - -  AST 15 - 41 U/L - - -  ALT 0 - 44 U/L - - -     SIGNED: Deatra James, MD,  FACP, FHM. [facility],  Pager [phone]  If 7PM-7AM, please contact night-coverage [email or web address] 04/19/2019, 12:11 PM

## 2019-04-19 NOTE — Consult Note (Addendum)
[facility] Renal Consultation Note  Requesting MD: Dr. Shahmehdi Indication for Consultation: Chronic Kidney Disease 3b  HPI: Kara Hanson is a 63 y.o. female w/ PMH of CKD3b, Combined systolic/diastolic heart failure, non-ischemic cardiomyopathy, Anemia of CKD, IDDM, Gout, HLD, HTN admit for acute respiratory failure.  Kara Hanson was in her usual state of health until 3 days prior to admission when she began to have progressively worsening shortness of breath and lower extremity edema. She denies any obvious inciting event. Mentions she endorses low sodium diet at home. She follows with Dr.Upton who told her to increase her home lasix dose to 80mg BID but her dyspnea with exertion was so severe that she came to [facility] for evaluation. She was found to have CHF exacerbation with pulmonary edema and elevated BNP and she was admitted for IV diuresis.   Since admission, she states that she has had good urinary output and her dyspnea has improved. She is on 4L at home and is currently on 4Ls. Her lower extremity edema has improved per her account. Urinary output is only recorded as 150ccs but she states she is endorsing large volume urine output. She denies any fevers, chills, sick contact, nausea, vomiting, chest pain, palpitations.  What I was able to find out is that she had hosp in November - discovered to have EF 30-35% with CKD-  crt in the mid to high 1's.  She was supposed to follow up with cardiology and nephrology-  Did not do until saw Dr. Upton on 1/13 when was in distress with volume overload and was directed to the hospital. She had an elevated BMP-  Was started on iv diuresis.  UOP not recorded, weight seems down but not reliable.  Clinically she is improved- crt at baseline.  Of note, is on 4 l [place] 02 at baseline   PMHx:   Past Medical History:  Diagnosis Date  . CHF (congestive heart failure) (HCC)   . CVA (cerebral vascular accident) (HCC)   . Diabetes mellitus  without complication (HCC)   . Hypercholesteremia   . Hypertension   . Myocardial infarction (HCC)   . Pneumonia 11/01/2018  . Spinal stenosis     Past Surgical History:  Procedure Laterality Date  . BIOPSY  01/27/2019   Procedure: BIOPSY;  Surgeon: Brahmbhatt, Parag, MD;  Location: [facility];  Service: Gastroenterology;;  . BLADDER SURGERY    . CARDIAC CATHETERIZATION N/A 04/25/2016   Procedure: Left Heart Cath and Coronary Angiography;  Surgeon: Jonathan J Berry, MD;  Location: [facility];  Service: Cardiovascular;  Laterality: N/A;  . CARDIAC CATHETERIZATION  02/11/2019  . CESAREAN SECTION    . CHOLECYSTECTOMY    . ESOPHAGOGASTRODUODENOSCOPY (EGD) WITH PROPOFOL N/A 01/27/2019   Procedure: ESOPHAGOGASTRODUODENOSCOPY (EGD) WITH PROPOFOL;  Surgeon: Brahmbhatt, Parag, MD;  Location: [facility];  Service: Gastroenterology;  Laterality: N/A;  . RIGHT/LEFT HEART CATH AND CORONARY ANGIOGRAPHY N/A 02/11/2019   Procedure: RIGHT/LEFT HEART CATH AND CORONARY ANGIOGRAPHY;  Surgeon: Bensimhon, Daniel R, MD;  Location: [facility];  Service: Cardiovascular;  Laterality: N/A;  . VIDEO BRONCHOSCOPY N/A 02/01/2019   Procedure: VIDEO BRONCHOSCOPY WITHOUT FLUORO;  Surgeon: Smith, Daniel C, MD;  Location: [facility];  Service: Endoscopy;  Laterality: N/A;    Family Hx:  Family History  Problem Relation Age of Onset  . Diabetes Mellitus II Father   . Stroke Father   . Healthy Mother        She is 89 years old.       Social History:  reports that she has never smoked. She has never used smokeless tobacco. She reports that she does not drink alcohol or use drugs.  Allergies:  Allergies  Allergen Reactions  . Garlic Shortness Of Breath, Itching and Swelling    Raw*  Hand itching and swelling  . Latex Itching  . Morphine And Related Itching and Other (See Comments)    Headache   . Other Itching    Reaction to newspaper ink -itching and headache    Medications: Prior to  Admission medications   Medication Sig Start Date End Date Taking? Authorizing Provider  allopurinol (ZYLOPRIM) 300 MG tablet Take 300 mg by mouth daily.   Yes [provider]  aspirin EC 81 MG tablet Take 81 mg by mouth daily.   Yes [provider]  atorvastatin (LIPITOR) 80 MG tablet Take 80 mg by mouth daily at 6 PM.   Yes [provider]  carvedilol (COREG) 25 MG tablet Take 1 tablet (25 mg total) by mouth 2 (two) times daily with a meal. 02/27/19  Yes Newlin, Enobong, MD  DULoxetine (CYMBALTA) 30 MG capsule Take 1 capsule (30 mg total) by mouth daily. 02/06/19  Yes Ogbata, Sylvester I, MD  furosemide (LASIX) 80 MG tablet Take 80 mg by mouth 2 (two) times daily.   Yes [provider]  gabapentin (NEURONTIN) 100 MG capsule Take 1 capsule (100 mg total) by mouth 2 (two) times daily. 02/27/19  Yes Newlin, Enobong, MD  insulin aspart (NOVOLOG) 100 UNIT/ML injection 0 to 12 units subcutaneously 3 times daily before meals as per sliding scale 02/27/19  Yes Newlin, Enobong, MD  Insulin Glargine (BASAGLAR KWIKPEN) 100 UNIT/ML SOPN Inject 0.35 mLs (35 Units total) into the skin at bedtime. 02/27/19  Yes Newlin, Enobong, MD  lansoprazole (PREVACID) 15 MG capsule Take 15 mg by mouth daily.  10/02/18  Yes [provider]  metoCLOPramide (REGLAN) 5 MG tablet Take 1 tablet (5 mg total) by mouth 3 (three) times daily. 02/05/19 02/05/20 Yes Ogbata, Sylvester I, MD  Multiple Vitamins-Minerals (MULTIVITAMIN WITH MINERALS) tablet Take 1 tablet by mouth daily.   Yes [provider]  polyethylene glycol (MIRALAX / GLYCOLAX) 17 g packet Take 17 g by mouth daily. Patient taking differently: Take 17 g by mouth daily as needed for moderate constipation.  10/02/18  Yes Krishnan, Gokul, MD  SUPER B COMPLEX/C CAPS Take 1 capsule by mouth daily.   Yes [provider]  Accu-Chek FastClix Lancets MISC USE AS DIRECTED TO TEST BLOOD SUGAR THREE TIMES DAILY 07/31/18    Newlin, Enobong, MD  Blood Glucose Monitoring Suppl (ACCU-CHEK GUIDE) w/Device KIT 1 each by Does not apply route 3 (three) times daily. 07/31/18   Newlin, Enobong, MD  EASY COMFORT PEN NEEDLES 31G X 5 MM MISC USE FOUR TIMES PER DAY FOR INSULIN ADMINISTRATION Patient taking differently: 4 (four) times daily.  10/02/18   Newlin, Enobong, MD  glucose blood (ACCU-CHEK GUIDE) test strip USE AS DIRECTED TO TEST BLOOD SUGAR THREE TIMES DAILY 07/31/18   Newlin, Enobong, MD  Lancet Device MISC Use as instructed 3 times daily 09/19/17   Fawze, Mina A, PA-C  Misc. Devices MISC Portable oxygen concentrator.  Diagnosis-chronic respiratory failure. 09/03/18   Newlin, Enobong, MD  Misc. Devices MISC [place] with seat. Dx: Congestive Heart Failure 10/19/18   Newlin, Enobong, MD  sodium chloride (OCEAN) 0.65 % SOLN nasal spray Place 1 spray into both nostrils as needed for congestion.    [provider]   Labs:  BMP Latest Ref Rng & Units 04/19/2019 04/18/2019 04/17/2019  Glucose 70 - 99 mg/dL 79 144(H) 184(H)  BUN 8 - 23 mg/dL 42(H) 42(H) 41(H)  Creatinine 0.44 - 1.00 mg/dL 1.88(H) 1.73(H) 1.69(H)  BUN/Creat Ratio 12 - 28 - - -  Sodium 135 - 145 mmol/L 139 141 140  Potassium 3.5 - 5.1 mmol/L 3.5 3.8 3.9  Chloride 98 - 111 mmol/L 103 105 103  CO2 22 - 32 mmol/L _0 Calcium 8.9 - 10.3 mg/dL 9.1 9.2 9.5   CBC Latest Ref Rng & Units 04/19/2019 04/17/2019 02/18/2019  WBC 4.0 - 10.5 K/uL 6.3 5.3 4.1  Hemoglobin 12.0 - 15.0 g/dL 7.9(L) 9.2(L) 7.9(L)  Hematocrit 36.0 - 46.0 % 25.8(L) 30.5(L) 25.9(L)  Platelets 150 - 400 K/uL 242 267 258   ROS:  Pertinent items are noted in HPI.  Physical Exam: Vitals:   04/19/19 0736 04/19/19 1110  BP: (!) 110/49 124/65  Pulse: 73 65  Resp: 15 20  Temp: 98.2 F (36.8 C) 98 F (36.7 C)  SpO2: 100% 100%     Gen: Well-developed, well nourished, NAD HEENT: NCAT head, hearing intact, EOMI, MMM Neck: supple, ROM intact, + JVD CV: RRR, S1, S2 normal, No rubs, no  murmurs Pulm: Bilateral rales up to mid thorax, no wheezes Abd: Soft, BS+, NTND, No rebound, no guarding Extm: ROM intact, Peripheral pulses intact, 2+ pitting edema up to knees Skin: Dry, Warm, normal turgor Neuro: AAOx3  Assessment/Plan: 1.Chronic Kidney Disease 3b: At baseline, appears to have requested nephro consult for continuity of care as she is followed closely with Dr.Upton. Not in AKI. On admission, at baseline renal function (Bun 41, Creatinine 1.69). Agree with IV diuresis. Creatinine rising (1.73->1.88) over the last 2 days. Can consider transition to oral if renal function grossly worsens. - C/w IV furosemide 80 BID - Strict I&Os - Daily weights - Replete K, Mag as appropriate - Avoid nephrotoxic meds - Will get U/A to r/o alternative causes of rising creatinine  Would not use crt per se to guide diuresis. If she still has volume on board, cont to diurese.  She is clinically improved  2. Acute on chronic systolic/diastolic heart failure: Cardiology on board. BNP 600 on admission. Pulmonary edema on X-ray. EF 40-45%. On 4L o2 [place] at home. Currently satting 100% on 4Ls - Per cardiology - F/u myeloma panel - C/w [place] O2  3. Hypertension/volume  - Am bp 124/65, Hypervolemic on exam, management as above  4. Anemia of chronic disease - At baseline 7.9. MCV 89.6 Likely due to CKD. TIBC 223, Iron Sat 11% - check ferritin - IV iron if Ferritin <30 - Trend cbc  This is not helping.  She needs IV iron and I will dose with ESA as well   5. Diabetes Mellitus - hgb a1c 7.4. On sliding scale + Lantus 35 - per primary  Mosetta Anis 04/19/2019, 2:45 PM  PGY-2 [place] [facility] Pager: [phone]  Attending attestation to follow  Patient seen and examined, agree with above note with above modifications. 64 year old hispanic female- with systolic dysfunction - COPD with 4 liter O2 req at baseline s/p hosp in November-  Did not paricipate in follow up- was not taking diuretics as  prescribed, now presents with volume overload. Has been managed appropriately by hosp and cards , clinically improved-  Cont diuresis.  I will give her some treatment for her anemia. I have told  her this is the argument for following up with her kidney and heart doctors in  The future Kellie Goldsborough, MD 04/19/2019     

## 2019-04-19 NOTE — Progress Notes (Signed)
Echocardiogram 2D Echocardiogram has been performed.  Kara Hanson 04/19/2019, 8:39 AM

## 2019-04-20 DIAGNOSIS — J810 Acute pulmonary edema: Secondary | ICD-10-CM

## 2019-04-20 DIAGNOSIS — N1832 Chronic kidney disease, stage 3b: Secondary | ICD-10-CM

## 2019-04-20 LAB — BASIC METABOLIC PANEL
Anion gap: 8 (ref 5–15)
BUN: 43 mg/dL — ABNORMAL HIGH (ref 8–23)
CO2: 30 mmol/L (ref 22–32)
Calcium: 8.8 mg/dL — ABNORMAL LOW (ref 8.9–10.3)
Chloride: 100 mmol/L (ref 98–111)
Creatinine, Ser: 2.05 mg/dL — ABNORMAL HIGH (ref 0.44–1.00)
GFR calc Af Amer: 29 mL/min — ABNORMAL LOW (ref >60)
GFR calc non Af Amer: 25 mL/min — ABNORMAL LOW (ref >60)
Glucose, Bld: 126 mg/dL — ABNORMAL HIGH (ref 70–99)
Potassium: 4 mmol/L (ref 3.5–5.1)
Sodium: 138 mmol/L (ref 135–145)

## 2019-04-20 LAB — CBC
HCT: 25.1 % — ABNORMAL LOW (ref 36.0–46.0)
Hemoglobin: 7.6 g/dL — ABNORMAL LOW (ref 12.0–15.0)
MCH: 27.5 pg (ref 26.0–34.0)
MCHC: 30.3 g/dL (ref 30.0–36.0)
MCV: 90.9 fL (ref 80.0–100.0)
Platelets: 218 10*3/uL (ref 150–400)
RBC: 2.76 MIL/uL — ABNORMAL LOW (ref 3.87–5.11)
RDW: 15.9 % — ABNORMAL HIGH (ref 11.5–15.5)
WBC: 4.6 10*3/uL (ref 4.0–10.5)
nRBC: 0 % (ref 0.0–0.2)

## 2019-04-20 LAB — GLUCOSE, CAPILLARY
Glucose-Capillary: 113 mg/dL — ABNORMAL HIGH (ref 70–99)
Glucose-Capillary: 135 mg/dL — ABNORMAL HIGH (ref 70–99)
Glucose-Capillary: 71 mg/dL (ref 70–99)

## 2019-04-20 LAB — BRAIN NATRIURETIC PEPTIDE: B Natriuretic Peptide: 290.1 pg/mL — ABNORMAL HIGH (ref 0.0–100.0)

## 2019-04-20 MED ORDER — FUROSEMIDE 80 MG PO TABS
80.0000 mg | ORAL_TABLET | Freq: Two times a day (BID) | ORAL | Status: DC
  Administered 2019-04-20: 80 mg via ORAL
  Filled 2019-04-20: qty 1

## 2019-04-20 NOTE — Discharge Instructions (Signed)
Pulmonary Edema Pulmonary edema is unusual buildup of fluid in the lungs. It makes it hard for a person to breathe. This condition is an emergency. Follow these instructions at home: Medicines  Take over-the-counter and prescription medicines only as told by your doctor.  If you were prescribed an antibiotic medicine, take it as told by your doctor. Do not stop taking the antibiotic even if you start to feel better.  Ask your doctor to help you write a plan with information about each medicine you take. This may include: ? Why you are taking it. ? Possible side effects. ? Best time of day to take it. ? Foods to take with it, or foods to avoid. ? When to stop taking it.  Make a list of each medicine, vitamin, or herbal supplement you take. ? Keep the list with you at all times. ? Show the list to your doctor at each visit and before starting a new medicine. ? Keep the list up to date. Lifestyle   Do regular exercise as told by your doctor. It is important to do it safely. You can do this by: ? Asking your doctor what exercises and activities are good and safe for you to do. ? Pacing your activities. This will prevent shortness of breath or chest pain. ? Resting for at least 1 hour before and after meals. ? Asking about cardiac rehabilitation programs. These may include:  Education.  Exercise plans.  Counseling.  Eat a heart-healthy diet that is low in salt, saturated fat, and cholesterol. Your doctor may suggest foods that are high in fiber, such as: ? Fresh fruits and vegetables. ? Whole grains. ? Beans.  Do not use any products that contain nicotine or tobacco, such as cigarettes and e-cigarettes. If you need help quitting, ask your doctor. General instructions  Keep a record of your weight. ? Record your hospital or clinic weight. When you get home, compare it to your scale and record your weight. ? Weigh yourself first thing in the morning each day, and record the  weights. You should weigh yourself every morning after you pee and before you eat breakfast. Wear the same amount of clothing each time you weigh yourself. ? Share your weight record with your doctor. These can help your doctor see if your body is holding extra fluid. ? Tell your doctor right away if you have gained weight quickly, or if you have gained weight as told by your doctor. Your medicines may need to be adjusted.  Check your blood pressure as often as told by your doctor. ? Buy a home blood pressure cuff at your drugstore. ? Record your blood pressure readings. Bring them with you for your clinic visits.  Stay at a healthy weight. Ask your doctor what weight is healthy for you.  Think about doing therapy or being a part of a support group.  Keep all follow-up visits as told by your doctor. This is important. Contact a doctor if:  You have questions about your medicines.  You miss a dose of your medicine. Get help right away if:  You have very bad chest pain, especially if the pain is crushing or pressure-like and spreads to the arms, back, neck, or jaw.  You have more swelling in your hands, feet, ankles, or belly (abdomen).  You feel sick to your stomach (nauseous).  You have strange sweating.  Your skin turns blue or pale.  Your shortness of breath gets worse.  You feel dizzy or  unsteady.  Your vision is blurry.  You have a headache.  You cough up bloody split.  You cannot sleep because it is hard to breathe.  You start to feel a "jumping" or "fluttering" sensation (palpitations) in the chest that is unusual for you.  You feel like you cannot get enough air.  You gain weight rapidly. These symptoms may be an emergency. Do not wait to see if the symptoms will go away. Get medical help right away. Call your local emergency services (911 in the [place] Do not drive yourself to the hospital. Summary  Pulmonary edema is unusual fluid buildup in the lungs that  can make it hard to breathe.  This condition is an emergency.  Keep a record of your weight. Call your doctor if you gain weight rapidly. This information is not intended to replace advice given to you by your health care provider. Make sure you discuss any questions you have with your health care provider. Document Revised: 03/03/2017 Document Reviewed: 06/21/2016 Elsevier Patient Education  2020 [employer].

## 2019-04-20 NOTE — Plan of Care (Signed)
  Problem: Education: Goal: Knowledge of General Education information will improve Description: Including pain rating scale, medication(s)/side effects and non-pharmacologic comfort measures Outcome: Progressing   Problem: Clinical Measurements: Goal: Ability to maintain clinical measurements within normal limits will improve Outcome: Progressing Goal: Diagnostic test results will improve Outcome: Progressing Goal: Respiratory complications will improve Outcome: Progressing Goal: Cardiovascular complication will be avoided Outcome: Progressing   Problem: Activity: Goal: Risk for activity intolerance will decrease Outcome: Progressing   Problem: Nutrition: Goal: Adequate nutrition will be maintained Outcome: Progressing   Problem: Coping: Goal: Level of anxiety will decrease Outcome: Progressing   Problem: Elimination: Goal: Will not experience complications related to urinary retention Outcome: Progressing   Problem: Safety: Goal: Ability to remain free from injury will improve Outcome: Progressing

## 2019-04-20 NOTE — Plan of Care (Signed)
  Problem: Education: Goal: Knowledge of General Education information will improve Description: Including pain rating scale, medication(s)/side effects and non-pharmacologic comfort measures 04/20/2019 1151 by Don Perking, RN Outcome: Completed/Met 04/20/2019 0848 by Don Perking, RN Outcome: Progressing   Problem: Clinical Measurements: Goal: Ability to maintain clinical measurements within normal limits will improve 04/20/2019 1151 by Don Perking, RN Outcome: Completed/Met 04/20/2019 0848 by Don Perking, RN Outcome: Progressing Goal: Diagnostic test results will improve 04/20/2019 1151 by Don Perking, RN Outcome: Completed/Met 04/20/2019 0848 by Don Perking, RN Outcome: Progressing Goal: Respiratory complications will improve 04/20/2019 1151 by Don Perking, RN Outcome: Completed/Met 04/20/2019 0848 by Don Perking, RN Outcome: Progressing Goal: Cardiovascular complication will be avoided 04/20/2019 1151 by Don Perking, RN Outcome: Completed/Met 04/20/2019 0848 by Don Perking, RN Outcome: Progressing   Problem: Activity: Goal: Risk for activity intolerance will decrease 04/20/2019 1151 by Don Perking, RN Outcome: Completed/Met 04/20/2019 0848 by Don Perking, RN Outcome: Progressing   Problem: Nutrition: Goal: Adequate nutrition will be maintained 04/20/2019 1151 by Don Perking, RN Outcome: Completed/Met 04/20/2019 0848 by Don Perking, RN Outcome: Progressing   Problem: Coping: Goal: Level of anxiety will decrease 04/20/2019 1151 by Don Perking, RN Outcome: Completed/Met 04/20/2019 0848 by Don Perking, RN Outcome: Progressing   Problem: Elimination: Goal: Will not experience complications related to urinary retention 04/20/2019 1151 by Don Perking, RN Outcome: Completed/Met 04/20/2019 0848 by Don Perking, RN Outcome: Progressing   Problem: Safety: Goal:  Ability to remain free from injury will improve 04/20/2019 1151 by Don Perking, RN Outcome: Completed/Met 04/20/2019 0848 by Don Perking, RN Outcome: Progressing

## 2019-04-20 NOTE — Progress Notes (Signed)
Explained and discussed discharge instruction with Mrs. Naziyah, no new medication continue same regimen. Gave care notes on heart failure, pulmonary edema and booklet of living better with heart failure. Rolling walker delivered at bedside. Going home with daugther on home oxygen. daugther brought oxygen tank from home. Patient on 4L/min home oxygen. No complaints at this time.

## 2019-04-20 NOTE — Discharge Summary (Addendum)
Physician Discharge Summary  Mar Zettler ZLY:[id number] DOB: 08-01-1955 DOA: 04/17/2019  PCP: [name], MD  Admit date: 04/17/2019 Discharge date: 04/20/2019  Admitted From:home Disposition:  home   Recommendations for Outpatient Follow-up:  - Multiple myeloma panel send yesterday by Dr Margaretann Loveless needs to be follow up - Please closely follow Hb  Home Health:  ordered  Equipment/Devices:  walker    Discharge Condition:  stable   CODE STATUS:  Full code   Diet recommendation:  Heart healthy Consultations:  Cardiology   Nephrology     Discharge Diagnoses:  Principal Problem:   Acute exacerbation of CHF (congestive heart failure) ([place]) Active Problems:   Gout   Acute on chronic respiratory failure with hypoxia ([place])   CKD (chronic kidney disease), stage III   Elevated troponin   Anemia of chronic disease   DM 2    Brief Summary: Jahnae Reichardis a 64 y.o.female with PMH/o combined systolic and diastolic congestive heart failure, Takatsubo cardiomyopathy in 2018 monoclonal gammopathy, chronic respiratory failure on 4 L home oxygen, CVA, type 2 diabetes, hypertension, hyperlipidemia, CKD stage III, gout presenting with progressive shortness of breath and edema. Patient wound noted to be on acute on chronic s/d CHF exacerbation. Recently increase Lasix to 80 mg twice daily by nephrologist with no relief.   Hospital Course:  Acute on chronic combined systolic and diastolic heart failure with nonischemic cardiomyopathy -Apparently the patient was not compliant with Lasix which resulted in this admission -EF 40 to 45% per echo done yesterday-it was previously 30 to 35% -The patient has been diuresed with IV Lasix with the assistance of nephrology and cardiology. -Today nephrology has recommended that she be transitioned to oral Lasix 80 mg twice daily and discharged home - Multiple myeloma panel send yesterday by Dr Margaretann Loveless needs to be follow up -Cardiology  recommends to consider an outpatient cardiac MRI with gadolinium if renal function improves to about 1.5-1.6 and avoid RAAS inhibitors -Pulse ox is 100% on 4 L of oxygen  CKD 3b - baseline ~ 1.5- 1.8 -Creatinine slightly up to 2.05 secondary to brisk diuresis -Nephrology recommends she follow-up with Dr. Hollie Salk   AOCD - Hb at 7-8 range and should be followed intermittently as outpt - iron panel showed no iron deficiency - she has been given Aranesp by nephrology  Hypertension Continue Coreg and diuretics  Diabetes mellitus type 2 with neuropathy  -Hemoglobin A1c 7.4 -Continue Lantus and gabapentin   Discharge Exam: Vitals:   04/20/19 0732 04/20/19 1119  BP: 119/63 (!) 142/67  Pulse: 81 70  Resp: 17 14  Temp: 98.8 F (37.1 C) 97.8 F (36.6 C)  SpO2: 100% 99%   Vitals:   04/20/19 0322 04/20/19 0556 04/20/19 0732 04/20/19 1119  BP: 107/62  119/63 (!) 142/67  Pulse: 65  81 70  Resp: _0 Temp: 98.2 F (36.8 C)  98.8 F (37.1 C) 97.8 F (36.6 C)  TempSrc: Oral  Oral Oral  SpO2: 99%  100% 99%  Weight:  71 kg    Height:        General: Pt is alert, awake, not in acute distress Cardiovascular: RRR, S1/S2 +, no rubs, no gallops Respiratory: CTA bilaterally, no wheezing, no rhonchi Abdominal: Soft, NT, ND, bowel sounds + Extremities: no edema, no cyanosis   Discharge Instructions  Discharge Instructions    Diet - low sodium heart healthy   Complete by: As directed    Increase activity slowly   Complete by:  As directed      Allergies as of 04/20/2019      Reactions   Garlic Shortness Of Breath, Itching, Swelling   Raw*  Hand itching and swelling   Latex Itching   Morphine And Related Itching, Other (See Comments)   Headache   Other Itching   Reaction to newspaper ink -itching and headache      Medication List    TAKE these medications   Accu-Chek FastClix Lancets Misc USE AS DIRECTED TO TEST BLOOD SUGAR THREE TIMES DAILY   Accu-Chek Guide  w/Device Kit 1 each by Does not apply route 3 (three) times daily.   allopurinol 300 MG tablet Commonly known as: ZYLOPRIM Take 300 mg by mouth daily.   aspirin EC 81 MG tablet Take 81 mg by mouth daily.   atorvastatin 80 MG tablet Commonly known as: LIPITOR Take 80 mg by mouth daily at 6 PM.   Basaglar KwikPen 100 UNIT/ML Sopn Inject 0.35 mLs (35 Units total) into the skin at bedtime.   carvedilol 25 MG tablet Commonly known as: COREG Take 1 tablet (25 mg total) by mouth 2 (two) times daily with a meal.   DULoxetine 30 MG capsule Commonly known as: CYMBALTA Take 1 capsule (30 mg total) by mouth daily.   Easy Comfort Pen Needles 31G X 5 MM Misc Generic drug: Insulin Pen Needle USE FOUR TIMES PER DAY FOR INSULIN ADMINISTRATION What changed: See the new instructions.   furosemide 80 MG tablet Commonly known as: LASIX Take 80 mg by mouth 2 (two) times daily.   gabapentin 100 MG capsule Commonly known as: NEURONTIN Take 1 capsule (100 mg total) by mouth 2 (two) times daily.   glucose blood test strip Commonly known as: Accu-Chek Guide USE AS DIRECTED TO TEST BLOOD SUGAR THREE TIMES DAILY   insulin aspart 100 UNIT/ML injection Commonly known as: novoLOG 0 to 12 units subcutaneously 3 times daily before meals as per sliding scale   Lancet Device Misc Use as instructed 3 times daily   lansoprazole 15 MG capsule Commonly known as: PREVACID Take 15 mg by mouth daily.   metoCLOPramide 5 MG tablet Commonly known as: Reglan Take 1 tablet (5 mg total) by mouth 3 (three) times daily.   Misc. Devices Misc Portable oxygen concentrator.  Diagnosis-chronic respiratory failure.   Misc. Devices Misc Rollaor with seat. Dx: Congestive Heart Failure   multivitamin with minerals tablet Take 1 tablet by mouth daily.   polyethylene glycol 17 g packet Commonly known as: MIRALAX / GLYCOLAX Take 17 g by mouth daily. What changed:   when to take this  reasons to take this    sodium chloride 0.65 % Soln nasal spray Commonly known as: OCEAN Place 1 spray into both nostrils as needed for congestion.   Super B Complex/C Caps Take 1 capsule by mouth daily.            Durable Medical Equipment  (From admission, onward)         Start     Ordered   04/20/19 1201  For home use only DME Walker rolling  Once    Question Answer Comment  Walker: With 5 Inch Wheels   Patient needs a walker to treat with the following condition Weakness of both lower extremities      04/20/19 1201          Allergies  Allergen Reactions  . Garlic Shortness Of Breath, Itching and Swelling    Raw*  Hand itching  and swelling  . Latex Itching  . Morphine And Related Itching and Other (See Comments)    Headache   . Other Itching    Reaction to newspaper ink -itching and headache     Procedures/Studies: Echo 1. Left ventricular ejection fraction, by visual estimation, is 40 to 45%. The left ventricle has mild to moderately decreased function. There is mildly increased left ventricular wall thickness. 2. The left ventricle demonstrates global hypokinesis. 3. Global right ventricle has normal systolc function.The right ventricular size is normal. 4. Aortic stenosis was incompletely evaluated but likely mild aortic valve stenosis (MG 92mHg, AVA 1.3 cm^2, DI 0.45) 5. The inferior vena cava is normal in size with <50% respiratory variability, suggesting right atrial pressure of 8 mmHg.  DG Chest 2 View  Result Date: 04/17/2019 CLINICAL DATA:  Shortness of breath EXAM: CHEST - 2 VIEW COMPARISON:  02/14/2019 FINDINGS: Mild cardiomegaly and vascular congestion. Diffuse interstitial prominence throughout the lungs could represent edema. There are bibasilar opacities which could reflect atelectasis or infiltrates. No visible effusions or acute bony abnormality. IMPRESSION: Cardiomegaly with vascular congestion and interstitial edema. Focal bibasilar airspace opacities could  reflect atelectasis or pneumonia. Electronically Signed   By: KRolm BaptiseM.D.   On: 04/17/2019 18:11   ECHOCARDIOGRAM LIMITED  Result Date: 04/19/2019   ECHOCARDIOGRAM LIMITED REPORT   Patient Name:   JALANNY RIVERSDate of Exam: 04/19/2019 Medical Rec #:  [id number]      Height:       59.0 in Accession #:    [id number]     Weight:       158.3 lb Date of Birth:  [birth year]      BSA:          1.67 m Patient Age:    89years        BP:           110/49 mmHg Patient Gender: F               HR:           70 bpm. Exam Location:  Inpatient  Procedure: Limited Echo, 3D Echo, Color Doppler and Cardiac Doppler Indications:    IY40.34Acute diastolic (congestive) heart failure  History:        Patient has prior history of Echocardiogram examinations, most                 recent 02/11/2019. COVID+ 09/24/18.  Sonographer:    ERaquel SarnaSenior RDCS Sonographer#2:  AClayton LefortRDCS (AE) Referring Phys: [id number] 1. Left ventricular ejection fraction, by visual estimation, is 40 to 45%. The left ventricle has mild to moderately decreased function. There is mildly increased left ventricular wall thickness.  2. The left ventricle demonstrates global hypokinesis.  3. Global right ventricle has normal systolc function.The right ventricular size is normal.  4. Aortic stenosis was incompletely evaluated but likely mild aortic valve stenosis (MG 850mg, AVA 1.3 cm^2, DI 0.45)  5. The inferior vena cava is normal in size with <50% respiratory variability, suggesting right atrial pressure of 8 mmHg. FINDINGS  Left Ventricle: Left ventricular ejection fraction, by visual estimation, is 40 to 45%. The left ventricle has mild to moderately decreased function. The left ventricle demonstrates global hypokinesis. There is mildly increased left ventricular wall thickness. Right Ventricle: The right ventricular size is normal. No increase in right ventricular wall thickness. Global RV systolic function is has normal  systolic function.  Pericardium: There is no evidence of pericardial effusion is seen. There is no evidence of pericardial effusion. Mitral Valve: The mitral valve is normal in structure. Trivial mitral valve regurgitation. Tricuspid Valve: The tricuspid valve is normal in structure. Tricuspid valve regurgitation is mild. Aortic Valve: The aortic valve was not well visualized. Aortic valve regurgitation is not visualized. Mild aortic stenosis is present. Aortic valve mean gradient measures 8.0 mmHg. Aortic valve peak gradient measures 13.2 mmHg. Aortic valve area, by VTI measures 1.28 cm. Aorta: The aortic root is normal in size and structure. Venous: The inferior vena cava is normal in size with less than 50% respiratory variability, suggesting right atrial pressure of 8 mmHg.  LEFT VENTRICLE          Normals PLAX 2D LVIDd:         5.20 cm  3.6 cm LVIDs:         3.90 cm  1.7 cm LV PW:         1.10 cm  1.4 cm LV IVS:        0.80 cm  1.3 cm LVOT diam:     1.90 cm  2.0 cm       3D Volume EF: LV SV:         64 ml    79 ml        3D EF:        43 % LV SV Index:   36.12    45 ml/m2     LV EDV:       193 ml LVOT Area:     2.84 cm 3.14 cm2     LV ESV:       110 ml                                      LV SV:        83 ml  LV Volumes (MOD)             Normals LV area d, A2C:    34.30 cm LV area d, A4C:    31.40 cm LV area s, A2C:    23.80 cm LV area s, A4C:    21.80 cm LV major d, A2C:   8.17 cm LV major d, A4C:   7.84 cm LV major s, A2C:   7.30 cm LV major s, A4C:   6.63 cm LV vol d, MOD A2C: 120.0 ml  68 ml LV vol d, MOD A4C: 105.0 ml LV vol s, MOD A2C: 66.2 ml   24 ml LV vol s, MOD A4C: 59.4 ml LV SV MOD A2C:     53.8 ml LV SV MOD A4C:     105.0 ml LV SV MOD BP:      48.5 ml   45 ml RIGHT VENTRICLE             IVC RV S prime:     11.30 cm/s  IVC diam: 1.90 cm TAPSE (M-mode): 2.0 cm LEFT ATRIUM         Index LA diam:    3.40 cm 2.04 cm/m  AORTIC VALVE                    Normals AV Area (Vmax):    1.19 cm AV Area  (Vmean):   1.17 cm     3.06 cm2 AV Area (VTI):  1.28 cm AV Vmax:           182.00 cm/s AV Vmean:          138.000 cm/s 77 cm/s AV VTI:            0.420 m      3.15 cm2 AV Peak Grad:      13.2 mmHg AV Mean Grad:      8.0 mmHg     3 mmHg LVOT Vmax:         76.40 cm/s LVOT Vmean:        57.100 cm/s  75 cm/s LVOT VTI:          0.190 m      25.3 cm LVOT/AV VTI ratio: 0.45         1  AORTA                 Normals Ao Root diam: 2.30 cm 31 mm  SHUNTS Systemic VTI:  0.19 m Systemic Diam: 1.90 cm  Oswaldo Milian MD Electronically signed by Oswaldo Milian MD Signature Date/Time: 04/19/2019/11:11:07 AMThe mitral valve is normal in structure.    Final      The results of significant diagnostics from this hospitalization (including imaging, microbiology, ancillary and laboratory) are listed below for reference.     Microbiology: Recent Results (from the past 240 hour(s))  SARS CORONAVIRUS 2 (TAT 6-24 HRS) Nasopharyngeal Nasopharyngeal Swab     Status: None   Collection Time: 04/18/19  2:27 AM   Specimen: Nasopharyngeal Swab  Result Value Ref Range Status   SARS Coronavirus 2 NEGATIVE NEGATIVE Final    Comment: (NOTE) SARS-CoV-2 target nucleic acids are NOT DETECTED. The SARS-CoV-2 RNA is generally detectable in upper and lower respiratory specimens during the acute phase of infection. Negative results do not preclude SARS-CoV-2 infection, do not rule out co-infections with other pathogens, and should not be used as the sole basis for treatment or other patient management decisions. Negative results must be combined with clinical observations, patient history, and epidemiological information. The expected result is Negative. Fact Sheet for Patients: SugarRoll.be Fact Sheet for Healthcare Providers: https://www.woods-mathews.com/ This test is not yet approved or cleared by the [place] FDA and  has been authorized for detection and/or diagnosis  of SARS-CoV-2 by FDA under an Emergency Use Authorization (EUA). This EUA will remain  in effect (meaning this test can be used) for the duration of the COVID-19 declaration under Section 56 4(b)(1) of the Act, 21 U.S.C. section 360bbb-3(b)(1), unless the authorization is terminated or revoked sooner. Performed at [facility], [place] [address] [place], [place] [address]   MRSA PCR Screening     Status: None   Collection Time: 04/18/19  4:03 AM   Specimen: Nasal Mucosa; Nasopharyngeal  Result Value Ref Range Status   MRSA by PCR NEGATIVE NEGATIVE Final    Comment:        The GeneXpert MRSA Assay (FDA approved for NASAL specimens only), is one component of a comprehensive MRSA colonization surveillance program. It is not intended to diagnose MRSA infection nor to guide or monitor treatment for MRSA infections. Performed at [facility], [place] [address], [place], [place] [address]   C difficile quick scan w PCR reflex     Status: Abnormal   Collection Time: 04/19/19  7:38 AM   Specimen: STOOL  Result Value Ref Range Status   C Diff antigen POSITIVE (A) NEGATIVE Final   C Diff toxin  NEGATIVE NEGATIVE Final   C Diff interpretation Results are indeterminate. See PCR results.  Final    Comment: Performed at [facility], [place] [address] [place],  [address]  C. Diff by PCR, Reflexed     Status: None   Collection Time: 04/19/19  7:38 AM  Result Value Ref Range Status   Toxigenic C. Difficile by PCR NEGATIVE NEGATIVE Final    Comment: Patient is colonized with non toxigenic C. difficile. May not need treatment unless significant symptoms are present. Performed at [facility], [place] [address], [place],  [address]      Labs: BNP (last 3 results) Recent Labs    04/18/19 0700 04/19/19 0216 04/20/19 0218  BNP 680.9* 465.8* 124.5*   Basic Metabolic Panel: Recent Labs  Lab 04/17/19 1722 04/18/19 1406 04/19/19 0216 04/20/19 0218  NA 140  141 139 138  K 3.9 3.8 3.5 4.0  CL 103 105 103 100  CO2 _0 GLUCOSE 184* 144* 79 126*  BUN 41* 42* 42* 43*  CREATININE 1.69* 1.73* 1.88* 2.05*  CALCIUM 9.5 9.2 9.1 8.8*   Liver Function Tests: No results for input(s): AST, ALT, ALKPHOS, BILITOT, PROT, ALBUMIN in the last 168 hours. No results for input(s): LIPASE, AMYLASE in the last 168 hours. No results for input(s): AMMONIA in the last 168 hours. CBC: Recent Labs  Lab 04/17/19 1722 04/19/19 0216 04/20/19 0218  WBC 5.3 6.3 4.6  HGB 9.2* 7.9* 7.6*  HCT 30.5* 25.8* 25.1*  MCV 91.0 89.6 90.9  PLT 267 242 218   Cardiac Enzymes: No results for input(s): CKTOTAL, CKMB, CKMBINDEX, TROPONINI in the last 168 hours. BNP: Invalid input(s): POCBNP CBG: Recent Labs  Lab 04/19/19 1718 04/19/19 2141 04/20/19 0007 04/20/19 0548 04/20/19 1118  GLUCAP 131* 95 71 113* 135*   D-Dimer No results for input(s): DDIMER in the last 72 hours. Hgb A1c Recent Labs    04/18/19 1116  HGBA1C 7.4*   Lipid Profile No results for input(s): CHOL, HDL, LDLCALC, TRIG, CHOLHDL, LDLDIRECT in the last 72 hours. Thyroid function studies No results for input(s): TSH, T4TOTAL, T3FREE, THYROIDAB in the last 72 hours.  Invalid input(s): FREET3 Anemia work up Recent Labs    04/19/19 0749 04/19/19 1553  FERRITIN  --  43  TIBC 223*  --   IRON 24*  --    Urinalysis    Component Value Date/Time   COLORURINE YELLOW 01/28/2019 0714   APPEARANCEUR TURBID (A) 01/28/2019 0714   LABSPEC 1.015 01/28/2019 0714   PHURINE 5.0 01/28/2019 0714   GLUCOSEU 50 (A) 01/28/2019 0714   HGBUR NEGATIVE 01/28/2019 0714   BILIRUBINUR NEGATIVE 01/28/2019 0714   BILIRUBINUR negative 12/13/2017 1514   KETONESUR NEGATIVE 01/28/2019 0714   PROTEINUR >=300 (A) 01/28/2019 0714   UROBILINOGEN 0.2 12/13/2017 1514   NITRITE NEGATIVE 01/28/2019 0714   LEUKOCYTESUR MODERATE (A) 01/28/2019 0714   Sepsis Labs Invalid input(s): PROCALCITONIN,  WBC,   LACTICIDVEN Microbiology Recent Results (from the past 240 hour(s))  SARS CORONAVIRUS 2 (TAT 6-24 HRS) Nasopharyngeal Nasopharyngeal Swab     Status: None   Collection Time: 04/18/19  2:27 AM   Specimen: Nasopharyngeal Swab  Result Value Ref Range Status   SARS Coronavirus 2 NEGATIVE NEGATIVE Final    Comment: (NOTE) SARS-CoV-2 target nucleic acids are NOT DETECTED. The SARS-CoV-2 RNA is generally detectable in upper and lower respiratory specimens during the acute phase of infection. Negative results do not preclude SARS-CoV-2 infection,  do not rule out co-infections with other pathogens, and should not be used as the sole basis for treatment or other patient management decisions. Negative results must be combined with clinical observations, patient history, and epidemiological information. The expected result is Negative. Fact Sheet for Patients: SugarRoll.be Fact Sheet for Healthcare Providers: https://www.woods-mathews.com/ This test is not yet approved or cleared by the [place] FDA and  has been authorized for detection and/or diagnosis of SARS-CoV-2 by FDA under an Emergency Use Authorization (EUA). This EUA will remain  in effect (meaning this test can be used) for the duration of the COVID-19 declaration under Section 56 4(b)(1) of the Act, 21 U.S.C. section 360bbb-3(b)(1), unless the authorization is terminated or revoked sooner. Performed at [facility], [place] [address], [place], [place] [address]   MRSA PCR Screening     Status: None   Collection Time: 04/18/19  4:03 AM   Specimen: Nasal Mucosa; Nasopharyngeal  Result Value Ref Range Status   MRSA by PCR NEGATIVE NEGATIVE Final    Comment:        The GeneXpert MRSA Assay (FDA approved for NASAL specimens only), is one component of a comprehensive MRSA colonization surveillance program. It is not intended to diagnose MRSA infection nor to guide or monitor  treatment for MRSA infections. Performed at [facility], [place] [address] [place], [place] [address]   C difficile quick scan w PCR reflex     Status: Abnormal   Collection Time: 04/19/19  7:38 AM   Specimen: STOOL  Result Value Ref Range Status   C Diff antigen POSITIVE (A) NEGATIVE Final   C Diff toxin NEGATIVE NEGATIVE Final   C Diff interpretation Results are indeterminate. See PCR results.  Final    Comment: Performed at [facility], [place] [address], [place], [place] [address]  C. Diff by PCR, Reflexed     Status: None   Collection Time: 04/19/19  7:38 AM  Result Value Ref Range Status   Toxigenic C. Difficile by PCR NEGATIVE NEGATIVE Final    Comment: Patient is colonized with non toxigenic C. difficile. May not need treatment unless significant symptoms are present. Performed at [facility], [place] [address], [place], [place] [address]      Time coordinating discharge in minutes: 76  SIGNED:   Debbe Odea, MD  [facility] 04/20/2019, 1:02 PM Pager   If 7PM-7AM, please contact night-coverage [email or web address] Password [email or web address]

## 2019-04-20 NOTE — Progress Notes (Signed)
Subjective:  Sleeping well when I enter room-  No c/o's-  Thinks breathing is back to her baseline  Objective Vital signs in last 24 hours: Vitals:   04/19/19 1941 04/20/19 0007 04/20/19 0322 04/20/19 0556  BP: (!) 135/57 113/76 107/62   Pulse: 65 67 65   Resp: 16 15 17    Temp: 98 F (36.7 C) 98 F (36.7 C) 98.2 F (36.8 C)   TempSrc: Oral Oral Oral   SpO2: 100% 99% 99%   Weight:    71 kg  Height:       Weight change: -0.8 kg  Intake/Output Summary (Last 24 hours) at 04/20/2019 0165 Last data filed at 04/20/2019 0600 Gross per 24 hour  Intake 309.11 ml  Output 1150 ml  Net -840.89 ml    Assessment/ Plan: Pt is a 64 y.o. yo female DM, combined CHF, CKD3 home O2 req who was admitted on 04/17/2019 with volume overload in the setting of noncompliance with lasix and appts  Assessment/Plan: 1. CHF-  Found to have pulm edema on admission-  Placed on iv lasix.  Difficult to determine as UOP has not been well recorded and hospital weights are historically unreliable but clinically appears to have diuresed and is back to her baseline O2 req.  I agree with changing her lasix to 80 po BID today  2. Renal-  crt baseline in the mid to high 1's.  Is 2 today.  Possibly in the setting of brisk diuresis ( although cannot confirm)  Or unmasking of true renal function with more appropriate volume status.  I would not consider this a reason to keep in house if discharge is planned today-  Should stabilize in OP setting   3. Anemia-  Very anemic-  Is probably not helping her SOB-  Have taken this opportunity to give IV iron and give dose of ESA while inpatient.  This can be cont as OP with Dr. Hollie Salk if she shows for her appts  4. HTN/volume-  Volume seems better.  BP OK on coreg 25 BID.  Even though crt is up slightly- she is clinically improved.  She would be cleared for discharge from my standpoint on lasix 80 mg po BID.  I have told her about the importance of following up with Dr. Hollie Salk and cardiology  to avoid future hospitalizations for volume overload    Louis Meckel    Labs: Basic Metabolic Panel: Recent Labs  Lab 04/18/19 1406 04/19/19 0216 04/20/19 0218  NA 141 139 138  K 3.8 3.5 4.0  CL 105 103 100  CO2 26 28 30   GLUCOSE 144* 79 126*  BUN 42* 42* 43*  CREATININE 1.73* 1.88* 2.05*  CALCIUM 9.2 9.1 8.8*   Liver Function Tests: No results for input(s): AST, ALT, ALKPHOS, BILITOT, PROT, ALBUMIN in the last 168 hours. No results for input(s): LIPASE, AMYLASE in the last 168 hours. No results for input(s): AMMONIA in the last 168 hours. CBC: Recent Labs  Lab 04/17/19 1722 04/19/19 0216 04/20/19 0218  WBC 5.3 6.3 4.6  HGB 9.2* 7.9* 7.6*  HCT 30.5* 25.8* 25.1*  MCV 91.0 89.6 90.9  PLT 267 242 218   Cardiac Enzymes: No results for input(s): CKTOTAL, CKMB, CKMBINDEX, TROPONINI in the last 168 hours. CBG: Recent Labs  Lab 04/19/19 1644 04/19/19 1718 04/19/19 2141 04/20/19 0007 04/20/19 0548  GLUCAP 87 131* 95 71 113*    Iron Studies:  Recent Labs    04/19/19 0749 04/19/19 1553  IRON  24*  --   TIBC 223*  --   FERRITIN  --  43   Studies/Results: ECHOCARDIOGRAM LIMITED  Result Date: 04/19/2019   ECHOCARDIOGRAM LIMITED REPORT   Patient Name:   Kara Hanson Date of Exam: 04/19/2019 Medical Rec #:  [id number]       Height:       59.0 in Accession #:    [id number]      Weight:       158.3 lb Date of Birth:  1955/08/24       BSA:          1.67 m Patient Age:    64 years        BP:           110/49 mmHg Patient Gender: F               HR:           70 bpm. Exam Location:  Inpatient  Procedure: Limited Echo, 3D Echo, Color Doppler and Cardiac Doppler Indications:    V40.08 Acute diastolic (congestive) heart failure  History:        Patient has prior history of Echocardiogram examinations, most                 recent 02/11/2019. COVID+ 09/24/18.  Sonographer:    Raquel Sarna Senior RDCS Sonographer#2:  Clayton Lefort RDCS (AE) Referring Phys: [id number] [place]  1. Left ventricular ejection fraction, by visual estimation, is 40 to 45%. The left ventricle has mild to moderately decreased function. There is mildly increased left ventricular wall thickness.  2. The left ventricle demonstrates global hypokinesis.  3. Global right ventricle has normal systolc function.The right ventricular size is normal.  4. Aortic stenosis was incompletely evaluated but likely mild aortic valve stenosis (MG 71mmHg, AVA 1.3 cm^2, DI 0.45)  5. The inferior vena cava is normal in size with <50% respiratory variability, suggesting right atrial pressure of 8 mmHg. FINDINGS  Left Ventricle: Left ventricular ejection fraction, by visual estimation, is 40 to 45%. The left ventricle has mild to moderately decreased function. The left ventricle demonstrates global hypokinesis. There is mildly increased left ventricular wall thickness. Right Ventricle: The right ventricular size is normal. No increase in right ventricular wall thickness. Global RV systolic function is has normal systolic function. Pericardium: There is no evidence of pericardial effusion is seen. There is no evidence of pericardial effusion. Mitral Valve: The mitral valve is normal in structure. Trivial mitral valve regurgitation. Tricuspid Valve: The tricuspid valve is normal in structure. Tricuspid valve regurgitation is mild. Aortic Valve: The aortic valve was not well visualized. Aortic valve regurgitation is not visualized. Mild aortic stenosis is present. Aortic valve mean gradient measures 8.0 mmHg. Aortic valve peak gradient measures 13.2 mmHg. Aortic valve area, by VTI measures 1.28 cm. Aorta: The aortic root is normal in size and structure. Venous: The inferior vena cava is normal in size with less than 50% respiratory variability, suggesting right atrial pressure of 8 mmHg.  LEFT VENTRICLE          Normals PLAX 2D LVIDd:         5.20 cm  3.6 cm LVIDs:         3.90 cm  1.7 cm LV PW:         1.10 cm  1.4 cm LV IVS:         0.80 cm  1.3 cm LVOT diam:     1.90 cm  2.0 cm       3D Volume EF: LV SV:         64 ml    79 ml        3D EF:        43 % LV SV Index:   36.12    45 ml/m2     LV EDV:       193 ml LVOT Area:     2.84 cm 3.14 cm2     LV ESV:       110 ml                                      LV SV:        83 ml  LV Volumes (MOD)             Normals LV area d, A2C:    34.30 cm LV area d, A4C:    31.40 cm LV area s, A2C:    23.80 cm LV area s, A4C:    21.80 cm LV major d, A2C:   8.17 cm LV major d, A4C:   7.84 cm LV major s, A2C:   7.30 cm LV major s, A4C:   6.63 cm LV vol d, MOD A2C: 120.0 ml  68 ml LV vol d, MOD A4C: 105.0 ml LV vol s, MOD A2C: 66.2 ml   24 ml LV vol s, MOD A4C: 59.4 ml LV SV MOD A2C:     53.8 ml LV SV MOD A4C:     105.0 ml LV SV MOD BP:      48.5 ml   45 ml RIGHT VENTRICLE             IVC RV S prime:     11.30 cm/s  IVC diam: 1.90 cm TAPSE (M-mode): 2.0 cm LEFT ATRIUM         Index LA diam:    3.40 cm 2.04 cm/m  AORTIC VALVE                    Normals AV Area (Vmax):    1.19 cm AV Area (Vmean):   1.17 cm     3.06 cm2 AV Area (VTI):     1.28 cm AV Vmax:           182.00 cm/s AV Vmean:          138.000 cm/s 77 cm/s AV VTI:            0.420 m      3.15 cm2 AV Peak Grad:      13.2 mmHg AV Mean Grad:      8.0 mmHg     3 mmHg LVOT Vmax:         76.40 cm/s LVOT Vmean:        57.100 cm/s  75 cm/s LVOT VTI:          0.190 m      25.3 cm LVOT/AV VTI ratio: 0.45         1  AORTA                 Normals Ao Root diam: 2.30 cm 31 mm  SHUNTS Systemic VTI:  0.19 m Systemic Diam: 1.90 cm  Oswaldo Milian MD Electronically signed by Oswaldo Milian MD Signature Date/Time: 04/19/2019/11:11:07 AMThe mitral valve is normal in structure.  Final    Medications: Infusions: . sodium chloride Stopped (04/19/19 2120)    Scheduled Medications: . allopurinol  300 mg Oral Daily  . aspirin EC  81 mg Oral Daily  . atorvastatin  80 mg Oral q1800  . B-complex with vitamin C  1 tablet Oral Daily  . carvedilol  25  mg Oral BID WC  . darbepoetin (ARANESP) injection - NON-DIALYSIS  100 mcg Subcutaneous Q Sat-1800  . DULoxetine  30 mg Oral Daily  . enoxaparin (LOVENOX) injection  40 mg Subcutaneous Daily  . furosemide  80 mg Intravenous BID  . gabapentin  100 mg Oral BID  . insulin aspart  0-9 Units Subcutaneous TID WC  . insulin glargine  35 Units Subcutaneous QHS  . metoCLOPramide  5 mg Oral TID WC  . multivitamin with minerals  1 tablet Oral Daily  . sodium chloride flush  3 mL Intravenous Q12H    have reviewed scheduled and prn medications.  Physical Exam: General: drowsy- just woke up Heart:  RRR Lungs: poor air movement Abdomen: soft, non tender Extremities: minimal periph edema    04/20/2019,7:14 AM  LOS: 2 days

## 2019-04-20 NOTE — Care Management (Signed)
Requested [employer] to bring RW to room today in preporation for DC.

## 2019-04-20 NOTE — Progress Notes (Signed)
Progress Note  Patient Name: Kara Hanson Date of Encounter: 04/20/2019  Primary Cardiologist: Candee Furbish, MD   Subjective   Feels better.  Able to lie fully flat in bed.  Edema essentially gone. Creatinine has increased further to 2.0.   Switched by Dr. Moshe Cipro from intravenous to p.o. diuretics.  Inpatient Medications    Scheduled Meds: . allopurinol  300 mg Oral Daily  . aspirin EC  81 mg Oral Daily  . atorvastatin  80 mg Oral q1800  . B-complex with vitamin C  1 tablet Oral Daily  . carvedilol  25 mg Oral BID WC  . darbepoetin (ARANESP) injection - NON-DIALYSIS  100 mcg Subcutaneous Q Sat-1800  . DULoxetine  30 mg Oral Daily  . enoxaparin (LOVENOX) injection  40 mg Subcutaneous Daily  . furosemide  80 mg Oral BID  . gabapentin  100 mg Oral BID  . insulin aspart  0-9 Units Subcutaneous TID WC  . insulin glargine  35 Units Subcutaneous QHS  . metoCLOPramide  5 mg Oral TID WC  . multivitamin with minerals  1 tablet Oral Daily  . sodium chloride flush  3 mL Intravenous Q12H   Continuous Infusions: . sodium chloride Stopped (04/19/19 2120)   PRN Meds: sodium chloride, acetaminophen, loperamide, sodium chloride, sodium chloride flush   Vital Signs    Vitals:   04/20/19 0007 04/20/19 0322 04/20/19 0556 04/20/19 0732  BP: 113/76 107/62  119/63  Pulse: 67 65  81  Resp: 15 17  17   Temp: 98 F (36.7 C) 98.2 F (36.8 C)  98.8 F (37.1 C)  TempSrc: Oral Oral  Oral  SpO2: 99% 99%  100%  Weight:   71 kg   Height:        Intake/Output Summary (Last 24 hours) at 04/20/2019 0902 Last data filed at 04/20/2019 0800 Gross per 24 hour  Intake 549.11 ml  Output 1150 ml  Net -600.89 ml   Last 3 Weights 04/20/2019 04/19/2019 04/18/2019  Weight (lbs) 156 lb 8.4 oz 158 lb 4.6 oz 171 lb 15.3 oz  Weight (kg) 71 kg 71.8 kg 78 kg      Telemetry    Sinus rhythm- Personally Reviewed  ECG    No new tracing- Personally Reviewed  Physical Exam  Comfortable lying  flat GEN: No acute distress.   Neck:  Difficult to see JVD Cardiac: RRR, no murmurs, rubs, or gallops.  Respiratory: Clear to auscultation bilaterally. GI: Soft, nontender, non-distended  MS: No edema; No deformity. Neuro:  Nonfocal  Psych: Normal affect   Labs    High Sensitivity Troponin:   Recent Labs  Lab 04/18/19 0202 04/18/19 0700  TROPONINIHS 18* 15      Chemistry Recent Labs  Lab 04/18/19 1406 04/19/19 0216 04/20/19 0218  NA 141 139 138  K 3.8 3.5 4.0  CL 105 103 100  CO2 26 28 30   GLUCOSE 144* 79 126*  BUN 42* 42* 43*  CREATININE 1.73* 1.88* 2.05*  CALCIUM 9.2 9.1 8.8*  GFRNONAA 31* 28* 25*  GFRAA 36* 32* 29*  ANIONGAP 10 8 8      Hematology Recent Labs  Lab 04/17/19 1722 04/19/19 0216 04/20/19 0218  WBC 5.3 6.3 4.6  RBC 3.35* 2.88* 2.76*  HGB 9.2* 7.9* 7.6*  HCT 30.5* 25.8* 25.1*  MCV 91.0 89.6 90.9  MCH 27.5 27.4 27.5  MCHC 30.2 30.6 30.3  RDW 15.8* 15.6* 15.9*  PLT 267 242 218    BNP Recent Labs  Lab  04/18/19 0700 04/19/19 0216 04/20/19 0218  BNP 680.9* 465.8* 290.1*     DDimer No results for input(s): DDIMER in the last 168 hours.   Radiology    ECHOCARDIOGRAM LIMITED  Result Date: 04/19/2019   ECHOCARDIOGRAM LIMITED REPORT   Patient Name:   Kara Hanson Date of Exam: 04/19/2019 Medical Rec #:  [id number]       Height:       59.0 in Accession #:    [id number]      Weight:       158.3 lb Date of Birth:  01-17-1956       BSA:          1.67 m Patient Age:    64 years        BP:           110/49 mmHg Patient Gender: F               HR:           70 bpm. Exam Location:  Inpatient  Procedure: Limited Echo, 3D Echo, Color Doppler and Cardiac Doppler Indications:    J17.91 Acute diastolic (congestive) heart failure  History:        Patient has prior history of Echocardiogram examinations, most                 recent 02/11/2019. COVID+ 09/24/18.  Sonographer:    Raquel Sarna Senior RDCS Sonographer#2:  Clayton Lefort RDCS (AE) Referring Phys: [id number]  [place]  1. Left ventricular ejection fraction, by visual estimation, is 40 to 45%. The left ventricle has mild to moderately decreased function. There is mildly increased left ventricular wall thickness.  2. The left ventricle demonstrates global hypokinesis.  3. Global right ventricle has normal systolc function.The right ventricular size is normal.  4. Aortic stenosis was incompletely evaluated but likely mild aortic valve stenosis (MG 33mmHg, AVA 1.3 cm^2, DI 0.45)  5. The inferior vena cava is normal in size with <50% respiratory variability, suggesting right atrial pressure of 8 mmHg. FINDINGS  Left Ventricle: Left ventricular ejection fraction, by visual estimation, is 40 to 45%. The left ventricle has mild to moderately decreased function. The left ventricle demonstrates global hypokinesis. There is mildly increased left ventricular wall thickness. Right Ventricle: The right ventricular size is normal. No increase in right ventricular wall thickness. Global RV systolic function is has normal systolic function. Pericardium: There is no evidence of pericardial effusion is seen. There is no evidence of pericardial effusion. Mitral Valve: The mitral valve is normal in structure. Trivial mitral valve regurgitation. Tricuspid Valve: The tricuspid valve is normal in structure. Tricuspid valve regurgitation is mild. Aortic Valve: The aortic valve was not well visualized. Aortic valve regurgitation is not visualized. Mild aortic stenosis is present. Aortic valve mean gradient measures 8.0 mmHg. Aortic valve peak gradient measures 13.2 mmHg. Aortic valve area, by VTI measures 1.28 cm. Aorta: The aortic root is normal in size and structure. Venous: The inferior vena cava is normal in size with less than 50% respiratory variability, suggesting right atrial pressure of 8 mmHg.  LEFT VENTRICLE          Normals PLAX 2D LVIDd:         5.20 cm  3.6 cm LVIDs:         3.90 cm  1.7 cm LV PW:         1.10  cm  1.4 cm LV IVS:  0.80 cm  1.3 cm LVOT diam:     1.90 cm  2.0 cm       3D Volume EF: LV SV:         64 ml    79 ml        3D EF:        43 % LV SV Index:   36.12    45 ml/m2     LV EDV:       193 ml LVOT Area:     2.84 cm 3.14 cm2     LV ESV:       110 ml                                      LV SV:        83 ml  LV Volumes (MOD)             Normals LV area d, A2C:    34.30 cm LV area d, A4C:    31.40 cm LV area s, A2C:    23.80 cm LV area s, A4C:    21.80 cm LV major d, A2C:   8.17 cm LV major d, A4C:   7.84 cm LV major s, A2C:   7.30 cm LV major s, A4C:   6.63 cm LV vol d, MOD A2C: 120.0 ml  68 ml LV vol d, MOD A4C: 105.0 ml LV vol s, MOD A2C: 66.2 ml   24 ml LV vol s, MOD A4C: 59.4 ml LV SV MOD A2C:     53.8 ml LV SV MOD A4C:     105.0 ml LV SV MOD BP:      48.5 ml   45 ml RIGHT VENTRICLE             IVC RV S prime:     11.30 cm/s  IVC diam: 1.90 cm TAPSE (M-mode): 2.0 cm LEFT ATRIUM         Index LA diam:    3.40 cm 2.04 cm/m  AORTIC VALVE                    Normals AV Area (Vmax):    1.19 cm AV Area (Vmean):   1.17 cm     3.06 cm2 AV Area (VTI):     1.28 cm AV Vmax:           182.00 cm/s AV Vmean:          138.000 cm/s 77 cm/s AV VTI:            0.420 m      3.15 cm2 AV Peak Grad:      13.2 mmHg AV Mean Grad:      8.0 mmHg     3 mmHg LVOT Vmax:         76.40 cm/s LVOT Vmean:        57.100 cm/s  75 cm/s LVOT VTI:          0.190 m      25.3 cm LVOT/AV VTI ratio: 0.45         1  AORTA                 Normals Ao Root diam: 2.30 cm 31 mm  SHUNTS Systemic VTI:  0.19 m Systemic Diam: 1.90 cm  Oswaldo Milian MD Electronically signed by Oswaldo Milian  MD Signature Date/Time: 04/19/2019/11:11:07 AMThe mitral valve is normal in structure.    Final     Cardiac Studies   Echo above  Patient Profile     64 y.o. female recurrent admissions for heart failure and mild-moderate reduction in LV systolic function (EF 38-93%), monoclonal gammopathy, acute on chronic stage III renal insufficiency,  diabetes mellitus and hyperlipidemia.  Assessment & Plan    Appears to be at or close to euvolemia. Switch to p.o. diuretics, monitor carefully for changes in renal function and weight changes.  Try to keep close to current weight. Consider outpatient cardiac MRI with gadolinium contrast if renal function returns to baseline of creatinine approximately 1.5-1.6. Avoiding RAAS inhibitors.     For questions or updates, please contact [place] Please consult [email or web address] for contact info under        Signed, Sanda Klein, MD  04/20/2019, 9:02 AM

## 2019-04-22 ENCOUNTER — Telehealth: Payer: Self-pay

## 2019-04-22 NOTE — Telephone Encounter (Signed)
Transition Care Management Follow-up Telephone Call  Date of discharge and from where: 04/20/2019, [facility]   How have you been since you were released from the hospital? She said she is feeling okay and explained that she was in the hospital because of " too much water."   Any questions or concerns? Her only concern was that her home O2 concentrator was beeping a lot.  She said that [name] called [employer] but has not heard back.  Encouraged her to have [name] keep trying to contact them as they provide the O2 equipment   Items Reviewed:  Did the pt receive and understand the discharge instructions provided? She said she has the instructions and has no questions   Medications obtained and verified? She said that she has the medication list and does not have any questions. No new medications/changes noted on the AVS. . She reports compliance with the med regime.   Any new allergies since your discharge?none reported   Do you have support at home? Lives with her daughter, [name]. She said that they plan to move this weekend into a home with no stairs.   Other (ie: DME, Home Health, etc) no home health ordered.   Has O2  - room concentrator and POC through [place]. Using O2 @ 4L continuously   She said her glucometer has stopped working.  She may need a new one  Needs a scale  Has walker and has been using as needed.   Has a wheelchair but it is still at her son's house.   Functional Questionnaire: (I = Independent and D = Dependent) ADL's: independent, daughter provides assist as needed.   Follow up appointments reviewed:    PCP Hospital f/u appt confirmed? Appointment scheduled for 05/07/2019 @ 1050 with [facility] f/u appt confirmed? Appointment with neurology - 05/23/2019, she needs to call cardiology for an appt as well as nephrology.   Are transportation arrangements needed?no ,she takes a cab.  Discussed the options of medicaid transportation  and [facility].  She said that [name] may know about those services  If their condition worsens, is the pt aware to call  their PCP or go to the ED?yes   Was the patient provided with contact information for the PCP's office or ED? She has the phone number for the clinic  Was the pt encouraged to call back with questions or concerns? yes

## 2019-04-23 LAB — MULTIPLE MYELOMA PANEL, SERUM
Albumin SerPl Elph-Mcnc: 2.5 g/dL — ABNORMAL LOW (ref 2.9–4.4)
Albumin/Glob SerPl: 1 (ref 0.7–1.7)
Alpha 1: 0.2 g/dL (ref 0.0–0.4)
Alpha2 Glob SerPl Elph-Mcnc: 0.8 g/dL (ref 0.4–1.0)
B-Globulin SerPl Elph-Mcnc: 1 g/dL (ref 0.7–1.3)
Gamma Glob SerPl Elph-Mcnc: 0.5 g/dL (ref 0.4–1.8)
Globulin, Total: 2.7 g/dL (ref 2.2–3.9)
IgA: 248 mg/dL (ref 87–352)
IgG (Immunoglobin G), Serum: 727 mg/dL (ref 586–1602)
IgM (Immunoglobulin M), Srm: 36 mg/dL (ref 26–217)
Total Protein ELP: 5.2 g/dL — ABNORMAL LOW (ref 6.0–8.5)

## 2019-04-26 ENCOUNTER — Other Ambulatory Visit: Payer: Self-pay | Admitting: Pharmacist

## 2019-04-26 DIAGNOSIS — IMO0002 Reserved for concepts with insufficient information to code with codable children: Secondary | ICD-10-CM

## 2019-04-26 DIAGNOSIS — E1165 Type 2 diabetes mellitus with hyperglycemia: Secondary | ICD-10-CM

## 2019-04-26 MED ORDER — ACCU-CHEK GUIDE VI STRP: ORAL_STRIP | 12 refills | Status: DC

## 2019-04-26 MED ORDER — LANCET DEVICE MISC: 0 refills | Status: DC

## 2019-04-26 MED ORDER — ACCU-CHEK GUIDE W/DEVICE KIT: 1.0000 | PACK | Freq: Three times a day (TID) | 0 refills | Status: DC

## 2019-05-06 ENCOUNTER — Telehealth: Payer: Self-pay

## 2019-05-06 ENCOUNTER — Ambulatory Visit: Payer: Medicare Other | Admitting: Family Medicine

## 2019-05-06 NOTE — Telephone Encounter (Signed)
Call placed to patient # [phone] to inform patient that the letter is ready for pick up. Message left with call back requested to this CM

## 2019-05-06 NOTE — Telephone Encounter (Signed)
Letter has been written. 

## 2019-05-06 NOTE — Telephone Encounter (Signed)
Call received from the patient.  She said that she was informed  her appt today was a televisit, not in person.  The appt was rescheduled for 05/08/2019.  She and her daughter just moved into a new home yesterday. She is thrilled that there are not any stairs in this home.  She is requesting a letter for [employer] stating that she is on O2 and can't have her power shut off.  She explained that this is just for [facility] records as they are not in any danger of having power turned off at this time.    She said she has not heard from [place] about her glucometer.   Call placed to [facility], RPH/[facility]. He said that they are not able to fill the order as it need to be filled under medicare part B.  He will contact the patient and offer to transfer it to another pharmacy for her.  Her phone number was confirmed.

## 2019-05-07 ENCOUNTER — Telehealth: Payer: Self-pay

## 2019-05-07 NOTE — Telephone Encounter (Signed)
Call placed to patient # [phone] again to inform patient that the letter is ready for pick up. Message left with call back requested to this CM

## 2019-05-07 NOTE — Telephone Encounter (Signed)
Call received from the patient and informed her that the letter for [facility] is ready for pick up

## 2019-05-08 ENCOUNTER — Other Ambulatory Visit: Payer: Self-pay

## 2019-05-08 ENCOUNTER — Ambulatory Visit: Payer: Medicare Other | Attending: Family Medicine | Admitting: Family Medicine

## 2019-05-08 DIAGNOSIS — E1165 Type 2 diabetes mellitus with hyperglycemia: Secondary | ICD-10-CM

## 2019-05-08 DIAGNOSIS — Z794 Long term (current) use of insulin: Secondary | ICD-10-CM | POA: Diagnosis not present

## 2019-05-08 DIAGNOSIS — E1121 Type 2 diabetes mellitus with diabetic nephropathy: Secondary | ICD-10-CM

## 2019-05-08 DIAGNOSIS — N1832 Chronic kidney disease, stage 3b: Secondary | ICD-10-CM

## 2019-05-08 DIAGNOSIS — D649 Anemia, unspecified: Secondary | ICD-10-CM

## 2019-05-08 DIAGNOSIS — E1140 Type 2 diabetes mellitus with diabetic neuropathy, unspecified: Secondary | ICD-10-CM

## 2019-05-08 DIAGNOSIS — I5033 Acute on chronic diastolic (congestive) heart failure: Secondary | ICD-10-CM | POA: Diagnosis not present

## 2019-05-08 DIAGNOSIS — E1122 Type 2 diabetes mellitus with diabetic chronic kidney disease: Secondary | ICD-10-CM

## 2019-05-08 NOTE — Progress Notes (Signed)
Patient has been called and DOB has been verified. Patient has been screened and transferred to PCP to start phone visit.    Legs are swelling.

## 2019-05-08 NOTE — Progress Notes (Signed)
Virtual Visit via Telephone Note  I connected with [name], on 05/08/2019 at 11:42 AM by telephone due to the COVID-19 pandemic and verified that I am speaking with the correct person using two identifiers.   Consent: I discussed the limitations, risks, security and privacy concerns of performing an evaluation and management service by telephone and the availability of in person appointments. I also discussed with the patient that there may be a patient responsible charge related to this service. The patient expressed understanding and agreed to proceed.   Location of Patient: Home  Location of Provider: Clinic   Persons participating in Telemedicine visit: Leyana Whidden [place]-CMA Dr. Margarita Rana     History of Present Illness: Syndey Hanson is a 64 year old female with a history of type 2 diabetes mellitus (A1c 7.4), hypertension, congestive heart failure (EF 40-45% from echo of1/2021, COVID-19 related pneumonia in 07/2018, multiple hospitalizations for CHF exacerbation seen for follow-up at the transitional care clinic after hospitalization at [facility] from 04/17/2019 through 04/20/2019 for acute pulmonary edema. She was treated with IV diuresis and subsequently transitioned to an increased dose of Lasix of 80 mg twice daily.  As work-up for her anemia (hemoglobin of 7-8) multiple myeloma panel was sent off which returned negative.  She did receive erythropoietin replacement therapy during her hospitalization.   Her legs are swollen she states and she is not urinating enough; her weight was 161 lbs today (up from 156 pounds at discharge) and she is dyspneic but this is at baseline and she is on 4L oxygen at the rest She has an appointment with Nephrology on 05/17/19 Yet to make an appointment with Cardiology. Compliant with 80 mg of furosemide twice daily.  With regards to her Diabetes her sugars get as high as 380 once in a while and it gets as low as 80.  She has no appetite and everything she eats tastes the same. Past Medical History:  Diagnosis Date  . CHF (congestive heart failure) ([place])   . CVA (cerebral vascular accident) ([place])   . Diabetes mellitus without complication ([place])   . Hypercholesteremia   . Hypertension   . Myocardial infarction ([place])   . Pneumonia 11/01/2018  . Spinal stenosis    Allergies  Allergen Reactions  . Garlic Shortness Of Breath, Itching and Swelling    Raw*  Hand itching and swelling  . Latex Itching  . Morphine And Related Itching and Other (See Comments)    Headache   . Other Itching    Reaction to newspaper ink -itching and headache    Current Outpatient Medications on File Prior to Visit  Medication Sig Dispense Refill  . Accu-Chek FastClix Lancets MISC USE AS DIRECTED TO TEST BLOOD SUGAR THREE TIMES DAILY 102 each 12  . allopurinol (ZYLOPRIM) 300 MG tablet Take 300 mg by mouth daily.    Marland Kitchen aspirin EC 81 MG tablet Take 81 mg by mouth daily.    Marland Kitchen atorvastatin (LIPITOR) 80 MG tablet Take 80 mg by mouth daily at 6 PM.    . Blood Glucose Monitoring Suppl (ACCU-CHEK GUIDE) w/Device KIT 1 each by Does not apply route 3 (three) times daily. 1 kit 0  . carvedilol (COREG) 25 MG tablet Take 1 tablet (25 mg total) by mouth 2 (two) times daily with a meal. 60 tablet 3  . DULoxetine (CYMBALTA) 30 MG capsule Take 1 capsule (30 mg total) by mouth daily. 30 capsule 3  . EASY COMFORT PEN NEEDLES 31G X 5  MM MISC USE FOUR TIMES PER DAY FOR INSULIN ADMINISTRATION (Patient taking differently: 4 (four) times daily. ) 200 each 12  . furosemide (LASIX) 80 MG tablet Take 80 mg by mouth 2 (two) times daily.    Marland Kitchen gabapentin (NEURONTIN) 100 MG capsule Take 1 capsule (100 mg total) by mouth 2 (two) times daily. 60 capsule 3  . glucose blood (ACCU-CHEK GUIDE) test strip USE AS DIRECTED TO TEST BLOOD SUGAR THREE TIMES DAILY 100 each 12  . insulin aspart (NOVOLOG) 100 UNIT/ML injection 0 to 12 units subcutaneously 3 times daily  before meals as per sliding scale 30 mL 3  . Insulin Glargine (BASAGLAR KWIKPEN) 100 UNIT/ML SOPN Inject 0.35 mLs (35 Units total) into the skin at bedtime. 30 mL 3  . Lancet Device MISC Use as instructed 3 times daily 1 each 0  . lansoprazole (PREVACID) 15 MG capsule Take 15 mg by mouth daily.     . metoCLOPramide (REGLAN) 5 MG tablet Take 1 tablet (5 mg total) by mouth 3 (three) times daily. 90 tablet 1  . Misc. Devices MISC Portable oxygen concentrator.  Diagnosis-chronic respiratory failure. 1 each 0  . Misc. Devices MISC [place] with seat. Dx: Congestive Heart Failure 1 each 0  . Multiple Vitamins-Minerals (MULTIVITAMIN WITH MINERALS) tablet Take 1 tablet by mouth daily.    . polyethylene glycol (MIRALAX / GLYCOLAX) 17 g packet Take 17 g by mouth daily. (Patient taking differently: Take 17 g by mouth daily as needed for moderate constipation. ) 14 each 0  . sodium chloride (OCEAN) 0.65 % SOLN nasal spray Place 1 spray into both nostrils as needed for congestion.    . SUPER B COMPLEX/C CAPS Take 1 capsule by mouth daily.     No current facility-administered medications on file prior to visit.    Observations/Objective: Awake, alert, oriented x3 Not in acute distress  CMP Latest Ref Rng & Units 04/20/2019 04/19/2019 04/18/2019  Glucose 70 - 99 mg/dL 126(H) 79 144(H)  BUN 8 - 23 mg/dL 43(H) 42(H) 42(H)  Creatinine 0.44 - 1.00 mg/dL 2.05(H) 1.88(H) 1.73(H)  Sodium 135 - 145 mmol/L 138 139 141  Potassium 3.5 - 5.1 mmol/L 4.0 3.5 3.8  Chloride 98 - 111 mmol/L 100 103 105  CO2 22 - 32 mmol/L 30 28 26   Calcium 8.9 - 10.3 mg/dL 8.8(L) 9.1 9.2  Total Protein 6.5 - 8.1 g/dL - - -  Total Bilirubin 0.3 - 1.2 mg/dL - - -  Alkaline Phos 38 - 126 U/L - - -  AST 15 - 41 U/L - - -  ALT 0 - 44 U/L - - -    CBC    Component Value Date/Time   WBC 4.6 04/20/2019 0218   RBC 2.76 (L) 04/20/2019 0218   HGB 7.6 (L) 04/20/2019 0218   HGB 11.0 (L) 09/17/2018 1456   HCT 25.1 (L) 04/20/2019 0218   HCT  32.5 (L) 09/17/2018 1456   PLT 218 04/20/2019 0218   PLT 313 09/17/2018 1456   MCV 90.9 04/20/2019 0218   MCV 85 09/17/2018 1456   MCH 27.5 04/20/2019 0218   MCHC 30.3 04/20/2019 0218   RDW 15.9 (H) 04/20/2019 0218   RDW 14.4 09/17/2018 1456   LYMPHSABS 0.7 02/18/2019 0250   LYMPHSABS 0.9 09/17/2018 1456   MONOABS 0.4 02/18/2019 0250   EOSABS 0.1 02/18/2019 0250   EOSABS 0.1 09/17/2018 1456   BASOSABS 0.0 02/18/2019 0250   BASOSABS 0.0 09/17/2018 1456    Lab Results  Component Value Date   HGBA1C 7.4 (H) 04/18/2019    Assessment and Plan: 1. Acute on chronic diastolic CHF (congestive heart failure) (HCC) EF 40 to 45% from echo of 04/2019 She does have some evidence of fluid overload and has been advised to increase morning dose of Lasix to 120 mg x5 days, continue 80 mg at night and inform cardiology clinic about this She does need an appointment soon with her cardiologist and has been advised to call for that. Anticipate some worsening of renal function with transient increase in Lasix dose. If symptoms persist she will need IV diuresis  2. Normochromic normocytic anemia Last hemoglobin was 7.6 Component of anemia of chronic disease She will need a colonoscopy and has been referred Multiple myeloma panel was negative and she has no back pain We will send off SPEP at next visit - Ambulatory referral to Gastroenterology  3. Type 2 diabetes mellitus with diabetic neuropathy, with long-term current use of insulin (HCC) Neuropathy is stable Continue gabapentin  4. Type 2 diabetes mellitus with stage 3b chronic kidney disease, with long-term current use of insulin (HCC) Combination of hypertensive and diabetic nephropathy Has upcoming appointment with nephrology  5. Type 2 diabetes mellitus with hyperglycemia, with long-term current use of insulin (HCC) A1c is close to goal at 7.4 with goal less than 7.5 She does have some labile sugars hence I will hold off on adjusting  her regimen Counseled on compliant with diabetic diet and lifestyle modifications   Follow Up Instructions: Return in about 1 month (around 06/05/2019) for Medicare wellness exam.    I discussed the assessment and treatment plan with the patient. The patient was provided an opportunity to ask questions and all were answered. The patient agreed with the plan and demonstrated an understanding of the instructions.   The patient was advised to call back or seek an in-person evaluation if the symptoms worsen or if the condition fails to improve as anticipated.     I provided 22 minutes total of non-face-to-face time during this encounter including median intraservice time, reviewing previous notes, investigations, ordering medications, medical decision making, coordinating care and patient verbalized understanding at the end of the visit.     [name], MD, FAAFP. [facility] [place], [place]   05/08/2019, 11:42 AM

## 2019-05-09 ENCOUNTER — Encounter: Payer: Self-pay | Admitting: Family Medicine

## 2019-05-18 ENCOUNTER — Inpatient Hospital Stay (HOSPITAL_COMMUNITY)
Admission: EM | Admit: 2019-05-18 | Discharge: 2019-05-22 | DRG: 291 | Disposition: A | Discharge status: Home Health | Payer: Medicare Other | Attending: Internal Medicine | Admitting: Internal Medicine

## 2019-05-18 ENCOUNTER — Encounter (HOSPITAL_COMMUNITY): Payer: Self-pay | Admitting: Emergency Medicine

## 2019-05-18 ENCOUNTER — Emergency Department (HOSPITAL_COMMUNITY): Payer: Medicare Other

## 2019-05-18 ENCOUNTER — Other Ambulatory Visit: Payer: Self-pay

## 2019-05-18 DIAGNOSIS — I252 Old myocardial infarction: Secondary | ICD-10-CM

## 2019-05-18 DIAGNOSIS — Z91040 Latex allergy status: Secondary | ICD-10-CM

## 2019-05-18 DIAGNOSIS — E669 Obesity, unspecified: Secondary | ICD-10-CM | POA: Diagnosis present

## 2019-05-18 DIAGNOSIS — I5021 Acute systolic (congestive) heart failure: Secondary | ICD-10-CM | POA: Diagnosis present

## 2019-05-18 DIAGNOSIS — Z833 Family history of diabetes mellitus: Secondary | ICD-10-CM

## 2019-05-18 DIAGNOSIS — R0602 Shortness of breath: Secondary | ICD-10-CM | POA: Diagnosis not present

## 2019-05-18 DIAGNOSIS — Z885 Allergy status to narcotic agent status: Secondary | ICD-10-CM

## 2019-05-18 DIAGNOSIS — E8770 Fluid overload, unspecified: Secondary | ICD-10-CM | POA: Diagnosis present

## 2019-05-18 DIAGNOSIS — Z79899 Other long term (current) drug therapy: Secondary | ICD-10-CM

## 2019-05-18 DIAGNOSIS — E7800 Pure hypercholesterolemia, unspecified: Secondary | ICD-10-CM | POA: Diagnosis present

## 2019-05-18 DIAGNOSIS — Z794 Long term (current) use of insulin: Secondary | ICD-10-CM

## 2019-05-18 DIAGNOSIS — I5043 Acute on chronic combined systolic (congestive) and diastolic (congestive) heart failure: Secondary | ICD-10-CM | POA: Diagnosis not present

## 2019-05-18 DIAGNOSIS — Z91018 Allergy to other foods: Secondary | ICD-10-CM

## 2019-05-18 DIAGNOSIS — I5041 Acute combined systolic (congestive) and diastolic (congestive) heart failure: Secondary | ICD-10-CM | POA: Diagnosis not present

## 2019-05-18 DIAGNOSIS — R0789 Other chest pain: Secondary | ICD-10-CM | POA: Diagnosis present

## 2019-05-18 DIAGNOSIS — J9611 Chronic respiratory failure with hypoxia: Secondary | ICD-10-CM | POA: Diagnosis not present

## 2019-05-18 DIAGNOSIS — E1122 Type 2 diabetes mellitus with diabetic chronic kidney disease: Secondary | ICD-10-CM | POA: Diagnosis present

## 2019-05-18 DIAGNOSIS — E1165 Type 2 diabetes mellitus with hyperglycemia: Secondary | ICD-10-CM | POA: Diagnosis not present

## 2019-05-18 DIAGNOSIS — I509 Heart failure, unspecified: Secondary | ICD-10-CM

## 2019-05-18 DIAGNOSIS — I110 Hypertensive heart disease with heart failure: Secondary | ICD-10-CM | POA: Diagnosis not present

## 2019-05-18 DIAGNOSIS — Z20822 Contact with and (suspected) exposure to covid-19: Secondary | ICD-10-CM | POA: Diagnosis present

## 2019-05-18 DIAGNOSIS — I130 Hypertensive heart and chronic kidney disease with heart failure and stage 1 through stage 4 chronic kidney disease, or unspecified chronic kidney disease: Principal | ICD-10-CM | POA: Diagnosis present

## 2019-05-18 DIAGNOSIS — Z91048 Other nonmedicinal substance allergy status: Secondary | ICD-10-CM

## 2019-05-18 DIAGNOSIS — E785 Hyperlipidemia, unspecified: Secondary | ICD-10-CM | POA: Diagnosis present

## 2019-05-18 DIAGNOSIS — Z7982 Long term (current) use of aspirin: Secondary | ICD-10-CM

## 2019-05-18 DIAGNOSIS — Z8673 Personal history of transient ischemic attack (TIA), and cerebral infarction without residual deficits: Secondary | ICD-10-CM

## 2019-05-18 DIAGNOSIS — E1159 Type 2 diabetes mellitus with other circulatory complications: Secondary | ICD-10-CM

## 2019-05-18 DIAGNOSIS — R609 Edema, unspecified: Secondary | ICD-10-CM | POA: Diagnosis not present

## 2019-05-18 DIAGNOSIS — I5023 Acute on chronic systolic (congestive) heart failure: Secondary | ICD-10-CM | POA: Diagnosis present

## 2019-05-18 DIAGNOSIS — Z823 Family history of stroke: Secondary | ICD-10-CM

## 2019-05-18 DIAGNOSIS — N183 Chronic kidney disease, stage 3 unspecified: Secondary | ICD-10-CM | POA: Diagnosis present

## 2019-05-18 DIAGNOSIS — M109 Gout, unspecified: Secondary | ICD-10-CM | POA: Diagnosis present

## 2019-05-18 DIAGNOSIS — R0902 Hypoxemia: Secondary | ICD-10-CM | POA: Diagnosis not present

## 2019-05-18 DIAGNOSIS — R251 Tremor, unspecified: Secondary | ICD-10-CM | POA: Diagnosis present

## 2019-05-18 DIAGNOSIS — E1140 Type 2 diabetes mellitus with diabetic neuropathy, unspecified: Secondary | ICD-10-CM | POA: Diagnosis present

## 2019-05-18 DIAGNOSIS — I10 Essential (primary) hypertension: Secondary | ICD-10-CM | POA: Diagnosis not present

## 2019-05-18 DIAGNOSIS — Z6832 Body mass index (BMI) 32.0-32.9, adult: Secondary | ICD-10-CM

## 2019-05-18 HISTORY — DX: Dyspnea, unspecified: R06.00

## 2019-05-18 LAB — BASIC METABOLIC PANEL
Anion gap: 10 (ref 5–15)
BUN: 48 mg/dL — ABNORMAL HIGH (ref 8–23)
CO2: 27 mmol/L (ref 22–32)
Calcium: 9.3 mg/dL (ref 8.9–10.3)
Chloride: 102 mmol/L (ref 98–111)
Creatinine, Ser: 1.56 mg/dL — ABNORMAL HIGH (ref 0.44–1.00)
GFR calc Af Amer: 40 mL/min — ABNORMAL LOW (ref >60)
GFR calc non Af Amer: 35 mL/min — ABNORMAL LOW (ref >60)
Glucose, Bld: 199 mg/dL — ABNORMAL HIGH (ref 70–99)
Potassium: 4 mmol/L (ref 3.5–5.1)
Sodium: 139 mmol/L (ref 135–145)

## 2019-05-18 LAB — CBC WITH DIFFERENTIAL/PLATELET
Abs Immature Granulocytes: 0.02 10*3/uL (ref 0.00–0.07)
Basophils Absolute: 0 10*3/uL (ref 0.0–0.1)
Basophils Relative: 1 %
Eosinophils Absolute: 0.1 10*3/uL (ref 0.0–0.5)
Eosinophils Relative: 2 %
HCT: 31.4 % — ABNORMAL LOW (ref 36.0–46.0)
Hemoglobin: 9.6 g/dL — ABNORMAL LOW (ref 12.0–15.0)
Immature Granulocytes: 0 %
Lymphocytes Relative: 10 %
Lymphs Abs: 0.5 10*3/uL — ABNORMAL LOW (ref 0.7–4.0)
MCH: 27.7 pg (ref 26.0–34.0)
MCHC: 30.6 g/dL (ref 30.0–36.0)
MCV: 90.8 fL (ref 80.0–100.0)
Monocytes Absolute: 0.3 10*3/uL (ref 0.1–1.0)
Monocytes Relative: 6 %
Neutro Abs: 3.9 10*3/uL (ref 1.7–7.7)
Neutrophils Relative %: 81 %
Platelets: 266 10*3/uL (ref 150–400)
RBC: 3.46 MIL/uL — ABNORMAL LOW (ref 3.87–5.11)
RDW: 15.7 % — ABNORMAL HIGH (ref 11.5–15.5)
WBC: 4.8 10*3/uL (ref 4.0–10.5)
nRBC: 0 % (ref 0.0–0.2)

## 2019-05-18 LAB — TROPONIN I (HIGH SENSITIVITY)
Troponin I (High Sensitivity): 11 ng/L (ref <18)
Troponin I (High Sensitivity): 12 ng/L

## 2019-05-18 LAB — SARS CORONAVIRUS 2 (TAT 6-24 HRS): SARS Coronavirus 2: NEGATIVE

## 2019-05-18 LAB — GLUCOSE, CAPILLARY
Glucose-Capillary: 116 mg/dL — ABNORMAL HIGH (ref 70–99)
Glucose-Capillary: 144 mg/dL — ABNORMAL HIGH (ref 70–99)

## 2019-05-18 LAB — BRAIN NATRIURETIC PEPTIDE: B Natriuretic Peptide: 875.8 pg/mL — ABNORMAL HIGH (ref 0.0–100.0)

## 2019-05-18 LAB — POC SARS CORONAVIRUS 2 AG -  ED: SARS Coronavirus 2 Ag: NEGATIVE

## 2019-05-18 MED ORDER — SODIUM CHLORIDE 0.9% FLUSH
3.0000 mL | Freq: Two times a day (BID) | INTRAVENOUS | Status: DC
  Administered 2019-05-18 – 2019-05-22 (×9): 3 mL via INTRAVENOUS

## 2019-05-18 MED ORDER — LORAZEPAM 2 MG/ML IJ SOLN
0.5000 mg | Freq: Once | INTRAMUSCULAR | Status: AC
  Administered 2019-05-18: 13:00:00 0.5 mg via INTRAVENOUS
  Filled 2019-05-18: qty 1

## 2019-05-18 MED ORDER — INSULIN ASPART 100 UNIT/ML ~~LOC~~ SOLN
0.0000 [IU] | Freq: Three times a day (TID) | SUBCUTANEOUS | Status: DC
  Administered 2019-05-19: 17:00:00 2 [IU] via SUBCUTANEOUS
  Administered 2019-05-21 – 2019-05-22 (×2): 1 [IU] via SUBCUTANEOUS

## 2019-05-18 MED ORDER — PANTOPRAZOLE SODIUM 20 MG PO TBEC
20.0000 mg | DELAYED_RELEASE_TABLET | Freq: Every day | ORAL | Status: DC
  Administered 2019-05-18 – 2019-05-22 (×5): 20 mg via ORAL
  Filled 2019-05-18 (×7): qty 1

## 2019-05-18 MED ORDER — ASPIRIN EC 81 MG PO TBEC
81.0000 mg | DELAYED_RELEASE_TABLET | Freq: Every day | ORAL | Status: DC
  Administered 2019-05-18 – 2019-05-22 (×5): 81 mg via ORAL
  Filled 2019-05-18 (×5): qty 1

## 2019-05-18 MED ORDER — HEPARIN SODIUM (PORCINE) 5000 UNIT/ML IJ SOLN
5000.0000 [IU] | Freq: Three times a day (TID) | INTRAMUSCULAR | Status: DC
  Administered 2019-05-18 – 2019-05-22 (×13): 5000 [IU] via SUBCUTANEOUS
  Filled 2019-05-18 (×13): qty 1

## 2019-05-18 MED ORDER — SODIUM CHLORIDE 0.9% FLUSH: 3.0000 mL | INTRAVENOUS | Status: DC | PRN

## 2019-05-18 MED ORDER — METOCLOPRAMIDE HCL 5 MG PO TABS
5.0000 mg | ORAL_TABLET | Freq: Three times a day (TID) | ORAL | Status: DC
  Administered 2019-05-18 – 2019-05-22 (×13): 5 mg via ORAL
  Filled 2019-05-18 (×15): qty 1

## 2019-05-18 MED ORDER — ACETAMINOPHEN 325 MG PO TABS: 650.0000 mg | ORAL_TABLET | ORAL | Status: DC | PRN

## 2019-05-18 MED ORDER — ISOSORBIDE MONONITRATE ER 30 MG PO TB24
30.0000 mg | ORAL_TABLET | Freq: Every day | ORAL | Status: DC
  Administered 2019-05-18 – 2019-05-22 (×5): 30 mg via ORAL
  Filled 2019-05-18 (×5): qty 1

## 2019-05-18 MED ORDER — CARVEDILOL 25 MG PO TABS
25.0000 mg | ORAL_TABLET | Freq: Two times a day (BID) | ORAL | Status: DC
  Administered 2019-05-18 – 2019-05-22 (×9): 25 mg via ORAL
  Filled 2019-05-18 (×2): qty 1
  Filled 2019-05-18: qty 2
  Filled 2019-05-18 (×7): qty 1

## 2019-05-18 MED ORDER — HYDRALAZINE HCL 10 MG PO TABS
10.0000 mg | ORAL_TABLET | Freq: Four times a day (QID) | ORAL | Status: DC
  Administered 2019-05-18 – 2019-05-22 (×18): 10 mg via ORAL
  Filled 2019-05-18 (×18): qty 1

## 2019-05-18 MED ORDER — ATORVASTATIN CALCIUM 80 MG PO TABS
80.0000 mg | ORAL_TABLET | Freq: Every day | ORAL | Status: DC
  Administered 2019-05-18 – 2019-05-22 (×5): 80 mg via ORAL
  Filled 2019-05-18 (×6): qty 1

## 2019-05-18 MED ORDER — SUPER B COMPLEX/C PO CAPS: 1.0000 | ORAL_CAPSULE | Freq: Every day | ORAL | Status: DC

## 2019-05-18 MED ORDER — GABAPENTIN 100 MG PO CAPS
100.0000 mg | ORAL_CAPSULE | Freq: Two times a day (BID) | ORAL | Status: DC
  Administered 2019-05-18 – 2019-05-19 (×4): 100 mg via ORAL
  Filled 2019-05-18 (×6): qty 1

## 2019-05-18 MED ORDER — ADULT MULTIVITAMIN W/MINERALS CH
1.0000 | ORAL_TABLET | Freq: Every day | ORAL | Status: DC
  Administered 2019-05-18 – 2019-05-22 (×5): 1 via ORAL
  Filled 2019-05-18 (×5): qty 1

## 2019-05-18 MED ORDER — FUROSEMIDE 10 MG/ML IJ SOLN
80.0000 mg | Freq: Two times a day (BID) | INTRAMUSCULAR | Status: DC
  Administered 2019-05-18 – 2019-05-20 (×4): 80 mg via INTRAVENOUS
  Filled 2019-05-18 (×6): qty 8

## 2019-05-18 MED ORDER — SODIUM CHLORIDE 0.9 % IV SOLN: 250.0000 mL | INTRAVENOUS | Status: DC | PRN

## 2019-05-18 MED ORDER — ALLOPURINOL 300 MG PO TABS
300.0000 mg | ORAL_TABLET | Freq: Every day | ORAL | Status: DC
  Administered 2019-05-18 – 2019-05-22 (×5): 300 mg via ORAL
  Filled 2019-05-18 (×5): qty 1

## 2019-05-18 MED ORDER — FUROSEMIDE 10 MG/ML IJ SOLN
80.0000 mg | Freq: Once | INTRAMUSCULAR | Status: AC
  Administered 2019-05-18: 80 mg via INTRAVENOUS
  Filled 2019-05-18: qty 8

## 2019-05-18 MED ORDER — INSULIN GLARGINE 100 UNIT/ML ~~LOC~~ SOLN
35.0000 [IU] | Freq: Every day | SUBCUTANEOUS | Status: DC
  Administered 2019-05-18: 35 [IU] via SUBCUTANEOUS
  Filled 2019-05-18 (×3): qty 0.35

## 2019-05-18 MED ORDER — POLYETHYLENE GLYCOL 3350 17 G PO PACK: 17.0000 g | PACK | Freq: Every day | ORAL | Status: DC | PRN

## 2019-05-18 MED ORDER — SALINE SPRAY 0.65 % NA SOLN
1.0000 | NASAL | Status: DC | PRN
  Filled 2019-05-18: qty 44

## 2019-05-18 MED ORDER — ONDANSETRON HCL 4 MG/2ML IJ SOLN: 4.0000 mg | Freq: Four times a day (QID) | INTRAMUSCULAR | Status: DC | PRN

## 2019-05-18 MED ORDER — DULOXETINE HCL 30 MG PO CPEP
30.0000 mg | ORAL_CAPSULE | Freq: Every day | ORAL | Status: DC
  Administered 2019-05-18 – 2019-05-22 (×5): 30 mg via ORAL
  Filled 2019-05-18 (×5): qty 1

## 2019-05-18 NOTE — ED Provider Notes (Signed)
[facility] EMERGENCY DEPARTMENT Provider Note   CSN: [id number] Arrival date & time: 05/18/19  0901     History Chief Complaint  Patient presents with  . Shortness of Breath    Kara Hanson is a 64 y.o. female with history of CHF, prior CVA, diabetes mellitus, hypertension, hyperlipidemia, MI presents for evaluation of acute onset, progressively worsening shortness of breath since yesterday.  Reports lower extremity edema and weight gain.  Reports feeling significantly dyspneic on exertion especially with getting up to walk to the bathroom.  She is on 4 L supplemental oxygen chronically.  Earlier this morning her power went out and she did not receive her supplemental oxygen for some time, called out to EMS for evaluation and was found to be hypoxic 84% on room air but improved with application of supplemental oxygen via nasal cannula.  She reports that while on route to the hospital with EMS she developed sharp left-sided chest pains that worsened with deep inspiration.  She was found to be hypertensive and was given 1 sublingual nitroglycerin with improvement in her blood pressure and complete resolution of her pain.  She denies any fevers, cough, nausea, vomiting, abdominal pain.  Reports that she has been compliant with her home Lasix which was recently increased to 80 mg twice daily.  She had a recent follow-up visit with her PCP on 05/08/2019 where she noted that she was urinating less and less and had gained about 5 pounds since her admission for CHF exacerbation in January.  She has an appointment to see her cardiologist early next week.  She is a never smoker.  The history is provided by the patient.       Past Medical History:  Diagnosis Date  . CHF (congestive heart failure) ([place])   . CVA (cerebral vascular accident) ([place])   . Diabetes mellitus without complication ([place])   . Hypercholesteremia   . Hypertension   . Myocardial infarction ([place])   . Pneumonia  11/01/2018  . Spinal stenosis     Patient Active Problem List   Diagnosis Date Noted  . Acute exacerbation of CHF (congestive heart failure) ([place]) 04/18/2019  . Elevated troponin 04/18/2019  . Acute respiratory failure with hypoxia ([place]) 02/09/2019  . NSTEMI (non-ST elevated myocardial infarction) ([place]) 02/09/2019  . S/P thoracentesis   . Atelectasis   . Chronic respiratory failure ([place])   . Acute renal failure superimposed on stage 3 chronic kidney disease ([place])   . Epigastric pain   . CHF (congestive heart failure) ([place]) 01/23/2019  . CAP (community acquired pneumonia) 10/31/2018  . Acute CHF (congestive heart failure) ([place]) 09/24/2018  . SIRS (systemic inflammatory response syndrome) ([place]) 07/25/2018  . COVID-19 virus infection 07/20/2018  . Chronic diastolic CHF (congestive heart failure) ([place]) 07/20/2018  . Pneumonia due to COVID-19 virus 07/20/2018  . Vitamin D deficiency 04/02/2018  . Spinal stenosis 03/29/2018  . Acute on chronic respiratory failure with hypoxia ([place]) 03/20/2018  . Controlled type 2 diabetes with neuropathy ([place]) 03/20/2018  . CKD (chronic kidney disease), stage III 03/20/2018  . Acute on chronic combined systolic and diastolic CHF (congestive heart failure) ([place]) 11/27/2017  . AKI (acute kidney injury) ([place]) 11/27/2017  . Acute respiratory distress 11/27/2017  . Bulging lumbar disc 11/14/2017  . Gout 11/14/2017  . Hyperlipidemia LDL goal <70 09/30/2016  . Chest pain 06/17/2016  . Stress-induced cardiomyopathy 05/19/2016  . Type 2 diabetes mellitus with diabetic neuropathy, unspecified ([place]) 05/06/2016  . Vertigo 05/06/2016  .  Controlled type 2 diabetes mellitus with hyperglycemia ([place]) 04/23/2016  . Hypertension 04/23/2016  . Normochromic normocytic anemia 04/23/2016  . Syncope 04/23/2016    Past Surgical History:  Procedure Laterality Date  . BIOPSY  01/27/2019   Procedure: BIOPSY;  Surgeon: Otis Brace, MD;  Location: [facility];   Service: Gastroenterology;;  . BLADDER SURGERY    . CARDIAC CATHETERIZATION N/A 04/25/2016   Procedure: Left Heart Cath and Coronary Angiography;  Surgeon: Lorretta Harp, MD;  Location: [facility];  Service: Cardiovascular;  Laterality: N/A;  . CARDIAC CATHETERIZATION  02/11/2019  . CESAREAN SECTION    . CHOLECYSTECTOMY    . ESOPHAGOGASTRODUODENOSCOPY (EGD) WITH PROPOFOL N/A 01/27/2019   Procedure: ESOPHAGOGASTRODUODENOSCOPY (EGD) WITH PROPOFOL;  Surgeon: Otis Brace, MD;  Location: [facility];  Service: Gastroenterology;  Laterality: N/A;  . RIGHT/LEFT HEART CATH AND CORONARY ANGIOGRAPHY N/A 02/11/2019   Procedure: RIGHT/LEFT HEART CATH AND CORONARY ANGIOGRAPHY;  Surgeon: Jolaine Artist, MD;  Location: [facility];  Service: Cardiovascular;  Laterality: N/A;  . VIDEO BRONCHOSCOPY N/A 02/01/2019   Procedure: VIDEO BRONCHOSCOPY WITHOUT FLUORO;  Surgeon: Candee Furbish, MD;  Location: [facility];  Service: Endoscopy;  Laterality: N/A;     OB History   No obstetric history on file.     Family History  Problem Relation Age of Onset  . Diabetes Mellitus II Father   . Stroke Father   . Healthy Mother        She is 57 years old.     Social History   Tobacco Use  . Smoking status: Never Smoker  . Smokeless tobacco: Never Used  Substance Use Topics  . Alcohol use: No  . Drug use: No    Home Medications Prior to Admission medications   Medication Sig Start Date End Date Taking? Authorizing Provider  allopurinol (ZYLOPRIM) 300 MG tablet Take 300 mg by mouth daily.   Yes [provider]  aspirin EC 81 MG tablet Take 81 mg by mouth daily.   Yes [provider]  atorvastatin (LIPITOR) 80 MG tablet Take 80 mg by mouth daily at 6 PM.   Yes [provider]  carvedilol (COREG) 25 MG tablet Take 1 tablet (25 mg total) by mouth 2 (two) times daily with a meal. 02/27/19  Yes Newlin, Enobong, MD  DULoxetine (CYMBALTA) 30 MG capsule  Take 1 capsule (30 mg total) by mouth daily. 02/06/19  Yes Dana Allan I, MD  furosemide (LASIX) 80 MG tablet Take 80 mg by mouth 2 (two) times daily.   Yes [provider]  gabapentin (NEURONTIN) 100 MG capsule Take 1 capsule (100 mg total) by mouth 2 (two) times daily. 02/27/19  Yes [name], MD  insulin aspart (NOVOLOG) 100 UNIT/ML injection 0 to 12 units subcutaneously 3 times daily before meals as per sliding scale 02/27/19  Yes Newlin, Enobong, MD  Insulin Glargine (BASAGLAR KWIKPEN) 100 UNIT/ML SOPN Inject 0.35 mLs (35 Units total) into the skin at bedtime. 02/27/19  Yes [name], MD  lansoprazole (PREVACID) 15 MG capsule Take 15 mg by mouth daily.  10/02/18  Yes [provider]  metoCLOPramide (REGLAN) 5 MG tablet Take 1 tablet (5 mg total) by mouth 3 (three) times daily. 02/05/19 02/05/20 Yes Bonnell Public, MD  Multiple Vitamins-Minerals (MULTIVITAMIN WITH MINERALS) tablet Take 1 tablet by mouth daily.   Yes [provider]  polyethylene glycol (MIRALAX / GLYCOLAX) 17 g packet Take 17 g by mouth daily. Patient taking  differently: Take 17 g by mouth daily as needed for moderate constipation.  10/02/18  Yes Bonnielee Haff, MD  sodium chloride (OCEAN) 0.65 % SOLN nasal spray Place 1 spray into both nostrils as needed for congestion.   Yes [provider]  SUPER B COMPLEX/C CAPS Take 1 capsule by mouth daily.   Yes [provider]  Accu-Chek FastClix Lancets MISC USE AS DIRECTED TO TEST BLOOD SUGAR THREE TIMES DAILY 07/31/18   [name], MD  Blood Glucose Monitoring Suppl (ACCU-CHEK GUIDE) w/Device KIT 1 each by Does not apply route 3 (three) times daily. 04/26/19   [name], MD  EASY COMFORT PEN NEEDLES 31G X 5 MM MISC USE FOUR TIMES PER DAY FOR INSULIN ADMINISTRATION Patient taking differently: 4 (four) times daily.  10/02/18   [name], MD  glucose blood (ACCU-CHEK GUIDE) test strip USE AS DIRECTED TO TEST  BLOOD SUGAR THREE TIMES DAILY 04/26/19   [name], MD  Lancet Device MISC Use as instructed 3 times daily 04/26/19   [name], MD  Misc. Devices MISC Portable oxygen concentrator.  Diagnosis-chronic respiratory failure. 09/03/18   [name], MD  Misc. Devices MISC [place] with seat. Dx: Congestive Heart Failure 10/19/18   [name], MD    Allergies    Garlic, Latex, Morphine and related, and Other  Review of Systems   Review of Systems  Constitutional: Positive for fatigue. Negative for chills and fever.  Respiratory: Positive for shortness of breath.   Cardiovascular: Positive for chest pain and leg swelling.  Gastrointestinal: Negative for abdominal pain, nausea and vomiting.  All other systems reviewed and are negative.   Physical Exam Updated Vital Signs BP (!) 169/80   Pulse 74   Temp 97.8 F (36.6 C) (Oral)   Resp 17   Ht 4' 11"  (1.499 m)   Wt 72.6 kg   SpO2 99%   BMI 32.32 kg/m   Physical Exam Vitals and nursing note reviewed.  Constitutional:      General: She is in acute distress.     Appearance: She is well-developed.     Comments: Chronically ill in appearance  HENT:     Head: Normocephalic and atraumatic.  Eyes:     General:        Right eye: No discharge.        Left eye: No discharge.     Conjunctiva/sclera: Conjunctivae normal.  Neck:     Vascular: No JVD.     Trachea: No tracheal deviation.  Cardiovascular:     Rate and Rhythm: Normal rate and regular rhythm.     Pulses: Normal pulses.     Comments: 2+ radial and DP/PT pulses bilaterally, Homans sign absent bilaterally, no palpable cords, compartments are soft, 2+ pitting edema of the bilateral lower extremities.  Pulmonary:     Effort: Tachypnea present.     Breath sounds: Examination of the right-lower field reveals rales. Examination of the left-lower field reveals rales. Rales present.     Comments: Speaking in short phrases, sitting upright.  SPO2 saturations 95% on 4 L  supplemental oxygen via nasal cannula. Abdominal:     General: Abdomen is protuberant. There is no distension.     Palpations: Abdomen is soft.     Tenderness: There is no abdominal tenderness. There is no guarding or rebound.     Hernia: A hernia is present.     Comments: Easily reducible umbilical hernia  Musculoskeletal:     Right lower leg: No  tenderness. Edema present.     Left lower leg: No tenderness. Edema present.  Skin:    General: Skin is warm and dry.     Findings: No erythema.  Neurological:     Mental Status: She is alert.  Psychiatric:        Behavior: Behavior normal.     ED Results / Procedures / Treatments   Labs (all labs ordered are listed, but only abnormal results are displayed) Labs Reviewed  BASIC METABOLIC PANEL - Abnormal; Notable for the following components:      Result Value   Glucose, Bld 199 (*)    BUN 48 (*)    Creatinine, Ser 1.56 (*)    GFR calc non Af Amer 35 (*)    GFR calc Af Amer 40 (*)    All other components within normal limits  CBC WITH DIFFERENTIAL/PLATELET - Abnormal; Notable for the following components:   RBC 3.46 (*)    Hemoglobin 9.6 (*)    HCT 31.4 (*)    RDW 15.7 (*)    Lymphs Abs 0.5 (*)    All other components within normal limits  BRAIN NATRIURETIC PEPTIDE - Abnormal; Notable for the following components:   B Natriuretic Peptide 875.8 (*)    All other components within normal limits  SARS CORONAVIRUS 2 (TAT 6-24 HRS)  POC SARS CORONAVIRUS 2 AG -  ED  TROPONIN I (HIGH SENSITIVITY)  TROPONIN I (HIGH SENSITIVITY)    EKG EKG Interpretation  Date/Time:  Saturday May 18 2019 09:36:34 EST Ventricular Rate:  73 PR Interval:    QRS Duration: 91 QT Interval:  441 QTC Calculation: 486 R Axis:   24 Text Interpretation: Sinus rhythm Probable LVH with secondary repol abnrm Borderline prolonged QT interval Non-specific ST-t changes Confirmed by Lajean Saver [phone]) on 05/18/2019 10:46:47 AM   Radiology DG Chest  Portable 1 View  Result Date: 05/18/2019 CLINICAL DATA:  Shortness of breath. EXAM: PORTABLE CHEST 1 VIEW COMPARISON:  04/07/2019 FINDINGS: Heart size is normal. Small bilateral pleural effusions and mild interstitial edema. Airspace density within the left lower lobe is noted which may represent atelectasis or pneumonia. IMPRESSION: 1. Suspect mild CHF. 2. Left lower lobe atelectasis versus pneumonia. Electronically Signed   By: Kerby Moors M.D.   On: 05/18/2019 10:04    Procedures Procedures (including critical care time)  Medications Ordered in ED Medications  allopurinol (ZYLOPRIM) tablet 300 mg (has no administration in time range)  aspirin EC tablet 81 mg (has no administration in time range)  atorvastatin (LIPITOR) tablet 80 mg (has no administration in time range)  carvedilol (COREG) tablet 25 mg (has no administration in time range)  DULoxetine (CYMBALTA) DR capsule 30 mg (has no administration in time range)  insulin glargine (LANTUS) injection 35 Units (has no administration in time range)  pantoprazole (PROTONIX) EC tablet 20 mg (has no administration in time range)  metoCLOPramide (REGLAN) tablet 5 mg (has no administration in time range)  polyethylene glycol (MIRALAX / GLYCOLAX) packet 17 g (has no administration in time range)  gabapentin (NEURONTIN) capsule 100 mg (has no administration in time range)  multivitamin with minerals tablet 1 tablet (has no administration in time range)  sodium chloride (OCEAN) 0.65 % nasal spray 1 spray (has no administration in time range)  sodium chloride flush (NS) 0.9 % injection 3 mL (has no administration in time range)  sodium chloride flush (NS) 0.9 % injection 3 mL (has no administration in time range)  0.9 %  sodium chloride infusion (has no administration in time range)  acetaminophen (TYLENOL) tablet 650 mg (has no administration in time range)  ondansetron (ZOFRAN) injection 4 mg (has no administration in time range)  heparin  injection 5,000 Units (has no administration in time range)  furosemide (LASIX) injection 80 mg (has no administration in time range)  isosorbide mononitrate (IMDUR) 24 hr tablet 30 mg (has no administration in time range)  hydrALAZINE (APRESOLINE) tablet 10 mg (has no administration in time range)  insulin aspart (novoLOG) injection 0-9 Units (has no administration in time range)  LORazepam (ATIVAN) injection 0.5 mg (has no administration in time range)  furosemide (LASIX) injection 80 mg (80 mg Intravenous Given 05/18/19 1009)    ED Course  I have reviewed the triage vital signs and the nursing notes.  Pertinent labs & imaging results that were available during my care of the patient were reviewed by me and considered in my medical decision making (see chart for details).    MDM Rules/Calculators/A&P                      Zakyra Kukuk was evaluated in Emergency Department on 05/18/2019 for the symptoms described in the history of present illness. She was evaluated in the context of the global COVID-19 pandemic, which necessitated consideration that the patient might be at risk for infection with the SARS-CoV-2 virus that causes COVID-19. Institutional protocols and algorithms that pertain to the evaluation of patients at risk for COVID-19 are in a state of rapid change based on information released by regulatory bodies including the CDC and federal and state organizations. These policies and algorithms were followed during the patient's care in the ED.  Patient presenting for evaluation of progressively worsening shortness of breath for 2 days.  This was unfortunately exacerbated by a power outage so she was unable to receive her supplemental oxygen for an unknown amount of time.  She was hypoxic to room air on EMS arrival but improved on her usual 4 L supplemental oxygen at baseline.  She appears tachypneic but saturations are stable in the ED at rest.  With ambulation O2 saturations dropped  to 84% and she becomes significantly more dyspneic.  She has bibasilar crackles on auscultation of the lungs and her chest x-ray shows findings consistent with mild CHF.  Her EKG shows normal sinus rhythm, no acute ischemic abnormalities and her troponins are stable.  Doubt ACS/MI at this time.  Doubt PE.  Point-of-care Covid test is negative and she has no infectious symptoms.  Lab work reviewed and interpreted by myself shows no leukocytosis, mild anemia though improved from most recent set of labs 4 weeks ago.  Her BNP is more elevated than usual.  I suspect that she is experiencing a CHF exacerbation.  She was given a dose of IV Lasix in the ED.  Spoke with Dr. Roosevelt Locks with [facility] hospitalist service who agrees to assume care of patient and bring her into the hospital for further evaluation and management.   Final Clinical Impression(s) / ED Diagnoses Final diagnoses:  Acute on chronic combined systolic and diastolic congestive heart failure [facility])    Rx / DC Orders ED Discharge Orders    None       Renita Papa, PA-C 05/18/19 1300    Lajean Saver, MD 05/18/19 1406

## 2019-05-18 NOTE — ED Triage Notes (Signed)
Per [facility] pt coming from home c/o shortness of breath, increased weight gain and pedal edema. Patients power went out this am and patient was unable to receive oxygen. Reports 4L [place] at home. Initial oxygen sat 84%, once on 4L sats went up to 95%. 1 nitro given en route for elevated bp in 180s. Patient adds decreased urine output and denies missing any doses of lasix.

## 2019-05-18 NOTE — ED Notes (Signed)
Patient ambulated to restroom with 4L [place] and stand by assist. Patient able to make it down half hallway maintaining oxygen saturation 93-96%. Patient began desating into 61s and respiratory increased. RN then wheeled patient back into room.

## 2019-05-18 NOTE — H&P (Signed)
History and Physical    Kara Hanson WUG:[id number] DOB: 1955/10/14 DOA: 05/18/2019  PCP: [name], MD   Patient coming from: Home  I have personally briefly reviewed patient's old medical records in [place]  Chief Complaint: Increasing short of breath  HPI: Kara Hanson is a 64 y.o. female with medical history significant of IDDM, hypertension, congestive heart failure (EF 40-45% from echo of1/2021, COVID-19 related pneumonia in 07/2018,  presented with increasing short of breath.  Patient was hospitalized last month for CHF decompensation, upon discharge, her Lasix dosage increased to 80 mg twice daily.  Over the last 2 weeks, she has had increasing short of breath on exertion especially with getting up to walk to the bathroom despite on 4 L supplemental oxygen which was her routine.  Earlier this morning her power went out and she did not receive her supplemental oxygen for some time, called out to EMS for evaluation and was found to be hypoxic 84% on room air but improved with application of supplemental oxygen via nasal cannula.  She reports that while on route to the hospital with EMS she developed sharp left-sided chest pains that worsened with deep inspiration.  She denies any fevers, cough, nausea, vomiting, abdominal pain.  ED Course: Blood pressure significantly increased, sublingual nitroglycerin was given with some improvement in her blood pressure and complete resolution of her pain.  She denies any fevers, cough, nausea, vomiting, abdominal pain.  Reports that she has been compliant with her home Lasix, salt and fluid restriction.  She had a recent follow-up visit with her PCP on 05/08/2019 where she noted that she was urinating less and less and had gained about 5 pounds since her admission for CHF exacerbation in January.    Review of Systems: As per HPI otherwise 10 point review of systems negative.    Past Medical History:  Diagnosis Date  . CHF (congestive  heart failure) ([place])   . CVA (cerebral vascular accident) ([place])   . Diabetes mellitus without complication ([place])   . Hypercholesteremia   . Hypertension   . Myocardial infarction ([place])   . Pneumonia 11/01/2018  . Spinal stenosis     Past Surgical History:  Procedure Laterality Date  . BIOPSY  01/27/2019   Procedure: BIOPSY;  Surgeon: Otis Brace, MD;  [facility];  Service: Gastroenterology;;  . BLADDER SURGERY    . CARDIAC CATHETERIZATION N/A 04/25/2016   Procedure: Left Heart Cath and Coronary Angiography;  Surgeon: Lorretta Harp, MD;  Location: [facility];  Service: Cardiovascular;  Laterality: N/A;  . CARDIAC CATHETERIZATION  02/11/2019  . CESAREAN SECTION    . CHOLECYSTECTOMY    . ESOPHAGOGASTRODUODENOSCOPY (EGD) WITH PROPOFOL N/A 01/27/2019   Procedure: ESOPHAGOGASTRODUODENOSCOPY (EGD) WITH PROPOFOL;  Surgeon: Otis Brace, MD;  [facility];  Service: Gastroenterology;  Laterality: N/A;  . RIGHT/LEFT HEART CATH AND CORONARY ANGIOGRAPHY N/A 02/11/2019   Procedure: RIGHT/LEFT HEART CATH AND CORONARY ANGIOGRAPHY;  Surgeon: Jolaine Artist, MD;  Location: [facility];  Service: Cardiovascular;  Laterality: N/A;  . VIDEO BRONCHOSCOPY N/A 02/01/2019   Procedure: VIDEO BRONCHOSCOPY WITHOUT FLUORO;  Surgeon: [place]ee Furbish, MD;  [facility];  Service: Endoscopy;  Laterality: N/A;     reports that she has never smoked. She has never used smokeless tobacco. She reports that she does not drink alcohol or use drugs.  Allergies  Allergen Reactions  . Garlic Shortness Of Breath, Itching and Swelling    Raw*  Hand itching and swelling  . Latex Itching  . Morphine And Related Itching and Other (See Comments)    Headache   . Other Itching    Reaction to newspaper ink -itching and headache    Family History  Problem Relation Age of Onset  . Diabetes Mellitus II Father   . Stroke Father   . Healthy Mother        She is  72 years old.     Prior to Admission medications   Medication Sig Start Date End Date Taking? Authorizing Provider  allopurinol (ZYLOPRIM) 300 MG tablet Take 300 mg by mouth daily.   Yes [provider]  aspirin EC 81 MG tablet Take 81 mg by mouth daily.   Yes [provider]  atorvastatin (LIPITOR) 80 MG tablet Take 80 mg by mouth daily at 6 PM.   Yes [provider]  carvedilol (COREG) 25 MG tablet Take 1 tablet (25 mg total) by mouth 2 (two) times daily with a meal. 02/27/19  Yes Newlin, Enobong, MD  DULoxetine (CYMBALTA) 30 MG capsule Take 1 capsule (30 mg total) by mouth daily. 02/06/19  Yes Dana Allan I, MD  furosemide (LASIX) 80 MG tablet Take 80 mg by mouth 2 (two) times daily.   Yes [provider]  gabapentin (NEURONTIN) 100 MG capsule Take 1 capsule (100 mg total) by mouth 2 (two) times daily. 02/27/19  Yes [name], MD  insulin aspart (NOVOLOG) 100 UNIT/ML injection 0 to 12 units subcutaneously 3 times daily before meals as per sliding scale 02/27/19  Yes Newlin, Enobong, MD  Insulin Glargine (BASAGLAR KWIKPEN) 100 UNIT/ML SOPN Inject 0.35 mLs (35 Units total) into the skin at bedtime. 02/27/19  Yes [name], MD  lansoprazole (PREVACID) 15 MG capsule Take 15 mg by mouth daily.  10/02/18  Yes [provider]  metoCLOPramide (REGLAN) 5 MG tablet Take 1 tablet (5 mg total) by mouth 3 (three) times daily. 02/05/19 02/05/20 Yes Bonnell Public, MD  Multiple Vitamins-Minerals (MULTIVITAMIN WITH MINERALS) tablet Take 1 tablet by mouth daily.   Yes [provider]  polyethylene glycol (MIRALAX / GLYCOLAX) 17 g packet Take 17 g by mouth daily. Patient taking differently: Take 17 g by mouth daily as needed for moderate constipation.  10/02/18  Yes Bonnielee Haff, MD  sodium chloride (OCEAN) 0.65 % SOLN nasal spray Place 1 spray into both nostrils as needed for congestion.   Yes [provider]  SUPER B  COMPLEX/C CAPS Take 1 capsule by mouth daily.   Yes [provider]  Accu-Chek FastClix Lancets MISC USE AS DIRECTED TO TEST BLOOD SUGAR THREE TIMES DAILY 07/31/18   [name], MD  Blood Glucose Monitoring Suppl (ACCU-CHEK GUIDE) w/Device KIT 1 each by Does not apply route 3 (three) times daily. 04/26/19   [name], MD  EASY COMFORT PEN NEEDLES 31G X 5 MM MISC USE FOUR TIMES PER DAY FOR INSULIN ADMINISTRATION Patient taking differently: 4 (four) times daily.  10/02/18   [name], MD  glucose blood (ACCU-CHEK GUIDE) test strip USE AS DIRECTED TO TEST BLOOD SUGAR THREE TIMES DAILY 04/26/19   [name], MD  Lancet Device MISC Use as instructed 3 times daily 04/26/19   [name], MD  Misc. Devices MISC Portable oxygen concentrator.  Diagnosis-chronic respiratory failure. 09/03/18   [name], MD  Misc. Devices MISC [place] with seat. Dx: Congestive Heart Failure 10/19/18   [name], MD    Physical Exam: Vitals:  05/18/19 0915 05/18/19 1030 05/18/19 1100 05/18/19 1156  BP: (!) 168/75 (!) 162/74 (!) 164/82 (!) 169/80  Pulse: 79 70 72 74  Resp: 17 19 (!) 22 17  Temp:      TempSrc:      SpO2: 100% 100% 100% 99%  Weight:      Height:        Constitutional: NAD, calm, comfortable Vitals:   05/18/19 0915 05/18/19 1030 05/18/19 1100 05/18/19 1156  BP: (!) 168/75 (!) 162/74 (!) 164/82 (!) 169/80  Pulse: 79 70 72 74  Resp: 17 19 (!) 22 17  Temp:      TempSrc:      SpO2: 100% 100% 100% 99%  Weight:      Height:       Eyes: PERRL, lids and conjunctivae normal ENMT: Mucous membranes are moist. Posterior pharynx clear of any exudate or lesions.Normal dentition.  Neck: normal, supple, JVD to about 6CM above clavicles Respiratory: Diffused crackles to B/L mid fields. Increased respiratory effort. Positive signs of using accessory muscle.  Cardiovascular: Diffuse crackles. No extremity edema. 2+ pedal pulses. No carotid bruits.  Abdomen: no  tenderness, no masses palpated. No hepatosplenomegaly. Bowel sounds positive.  Musculoskeletal: no clubbing / cyanosis. No joint deformity upper and lower extremities. Good ROM, no contractures. Normal muscle tone.  Skin: no rashes, lesions, ulcers. No induration Neurologic: CN 2-12 grossly intact. Sensation intact, DTR normal. Strength 5/5 in all 4.  Psychiatric: Normal judgment and insight. Alert and oriented x 3. Normal mood.    Labs on Admission: I have personally reviewed following labs and imaging studies  CBC: Recent Labs  Lab 05/18/19 0926  WBC 4.8  NEUTROABS 3.9  HGB 9.6*  HCT 31.4*  MCV 90.8  PLT 062   Basic Metabolic Panel: Recent Labs  Lab 05/18/19 0926  NA 139  K 4.0  CL 102  CO2 27  GLUCOSE 199*  BUN 48*  CREATININE 1.56*  CALCIUM 9.3   GFR: Estimated Creatinine Clearance: 31.6 mL/min (A) (by C-G formula based on SCr of 1.56 mg/dL (H)). Liver Function Tests: No results for input(s): AST, ALT, ALKPHOS, BILITOT, PROT, ALBUMIN in the last 168 hours. No results for input(s): LIPASE, AMYLASE in the last 168 hours. No results for input(s): AMMONIA in the last 168 hours. Coagulation Profile: No results for input(s): INR, PROTIME in the last 168 hours. Cardiac Enzymes: No results for input(s): CKTOTAL, CKMB, CKMBINDEX, TROPONINI in the last 168 hours. BNP (last 3 results) No results for input(s): PROBNP in the last 8760 hours. HbA1C: No results for input(s): HGBA1C in the last 72 hours. CBG: No results for input(s): GLUCAP in the last 168 hours. Lipid Profile: No results for input(s): CHOL, HDL, LDLCALC, TRIG, CHOLHDL, LDLDIRECT in the last 72 hours. Thyroid Function Tests: No results for input(s): TSH, T4TOTAL, FREET4, T3FREE, THYROIDAB in the last 72 hours. Anemia Panel: No results for input(s): VITAMINB12, FOLATE, FERRITIN, TIBC, IRON, RETICCTPCT in the last 72 hours. Urine analysis:    Component Value Date/Time   COLORURINE YELLOW 01/28/2019 0714    APPEARANCEUR TURBID (A) 01/28/2019 0714   LABSPEC 1.015 01/28/2019 0714   PHURINE 5.0 01/28/2019 0714   GLUCOSEU 50 (A) 01/28/2019 0714   HGBUR NEGATIVE 01/28/2019 [place] 01/28/2019 0714   BILIRUBINUR negative 12/13/2017 1514   KETONESUR NEGATIVE 01/28/2019 0714   PROTEINUR >=300 (A) 01/28/2019 0714   UROBILINOGEN 0.2 12/13/2017 1514   NITRITE NEGATIVE 01/28/2019 0714   LEUKOCYTESUR MODERATE (A) 01/28/2019  5701    Radiological Exams on Admission: DG Chest Portable 1 View  Result Date: 05/18/2019 CLINICAL DATA:  Shortness of breath. EXAM: PORTABLE CHEST 1 VIEW COMPARISON:  04/07/2019 FINDINGS: Heart size is normal. Small bilateral pleural effusions and mild interstitial edema. Airspace density within the left lower lobe is noted which may represent atelectasis or pneumonia. IMPRESSION: 1. Suspect mild CHF. 2. Left lower lobe atelectasis versus pneumonia. Electronically Signed   By: Kerby Moors M.D.   On: 05/18/2019 10:04    EKG: Independently reviewed.   Assessment/Plan Active Problems:   Acute CHF (congestive heart failure) (HCC)   CHF (congestive heart failure) (HCC)  Acute on chronic CHF decompensation systolic Signs of fluid overload on clinical exam and x-ray Switch to IV Lasix 80 mg twice daily There is also an element of uncontrolled hypertension, but her discharge record from last summer showing her blood pressure was fairly controlled upon discharge, so no sure why her blood pressure was so high today might be she was very anxious when the power outage happened and her oxygen concentrator went out. I will start her on hydralazine and Imdur regimen, will adjust dosage according to her BP response CHF protocol with daily weight, I and Os  Hypoxic respiratory failure secondary to systolic CHF decompensation As above  Uncontrolled HTN As above  Atypical chest pain Most likely related to CHF decompensation, 2 sets troponin negative, EKG no acute ST  changes. Treat CHF  CKD stage III Able, creatinine level at her baseline Daily BMP and magnesium  Uncontrolled IDDM With proteinuria Indication for tighter blood pressure control Lantus plus sliding scale  DM neuropathy On renal dosed gabapentin  Gout Continue allopurinol   DVT prophylaxis: Heparin subcu Code Status: Full code Family Communication: None at bedside Disposition Plan: Likely home once breathing status improved Consults called: None Admission status: Telemetry observation, likely can be discharged in 24 to 48 hours once blood pressure controlled and fluid overload improved.  We will need home care.   Lequita Halt MD [facility] Pager [phone]    05/18/2019, 12:41 PM

## 2019-05-18 NOTE — ED Notes (Signed)
ED TO INPATIENT HANDOFF REPORT  ED Nurse Name and Phone #: Joellen Jersey [phone]  S Name/Age/Gender Kara Hanson 64 y.o. female Room/Bed: 015C/015C  Code Status   Code Status: Full Code  Home/SNF/Other Home Patient oriented to: self, place, time and situation Is this baseline? Yes   Triage Complete: Triage complete  Chief Complaint CHF (congestive heart failure) ([facility]) [I50.9]  Triage Note Per [facility] pt coming from home c/o shortness of breath, increased weight gain and pedal edema. Patients power went out this am and patient was unable to receive oxygen. Reports 4L [place] at home. Initial oxygen sat 84%, once on 4L sats went up to 95%. 1 nitro given en route for elevated bp in 180s. Patient adds decreased urine output and denies missing any doses of lasix.     Allergies Allergies  Allergen Reactions  . Garlic Shortness Of Breath, Itching and Swelling    Raw*  Hand itching and swelling  . Latex Itching  . Morphine And Related Itching and Other (See Comments)    Headache   . Other Itching    Reaction to newspaper ink -itching and headache    Level of Care/Admitting Diagnosis ED Disposition    ED Disposition Condition [place]: [place] [[id number]]  Level of Care: Telemetry Medical [104]  I expect the patient will be discharged within 24 hours: No (not a candidate for 5C-Observation unit)  Covid Evaluation: Asymptomatic Screening Protocol (No Symptoms)  Diagnosis: CHF (congestive heart failure) [facility]) [[id number]]  Admitting Physician: Lequita Halt [[id number]]  Attending Physician: Lequita Halt [[id number]]       B Medical/Surgery History Past Medical History:  Diagnosis Date  . CHF (congestive heart failure) ([place])   . CVA (cerebral vascular accident) ([place])   . Diabetes mellitus without complication ([place])   . Hypercholesteremia   . Hypertension   . Myocardial infarction ([place])   . Pneumonia 11/01/2018  . Spinal stenosis    Past  Surgical History:  Procedure Laterality Date  . BIOPSY  01/27/2019   Procedure: BIOPSY;  Surgeon: Otis Brace, MD;  [facility];  Service: Gastroenterology;;  . BLADDER SURGERY    . CARDIAC CATHETERIZATION N/A 04/25/2016   Procedure: Left Heart Cath and Coronary Angiography;  Surgeon: Lorretta Harp, MD;  Location: [facility];  Service: Cardiovascular;  Laterality: N/A;  . CARDIAC CATHETERIZATION  02/11/2019  . CESAREAN SECTION    . CHOLECYSTECTOMY    . ESOPHAGOGASTRODUODENOSCOPY (EGD) WITH PROPOFOL N/A 01/27/2019   Procedure: ESOPHAGOGASTRODUODENOSCOPY (EGD) WITH PROPOFOL;  Surgeon: Otis Brace, MD;  [facility];  Service: Gastroenterology;  Laterality: N/A;  . RIGHT/LEFT HEART CATH AND CORONARY ANGIOGRAPHY N/A 02/11/2019   Procedure: RIGHT/LEFT HEART CATH AND CORONARY ANGIOGRAPHY;  Surgeon: Jolaine Artist, MD;  Location: [facility];  Service: Cardiovascular;  Laterality: N/A;  . VIDEO BRONCHOSCOPY N/A 02/01/2019   Procedure: VIDEO BRONCHOSCOPY WITHOUT FLUORO;  Surgeon: [place]ee Furbish, MD;  [facility];  Service: Endoscopy;  Laterality: N/A;     A IV Location/Drains/Wounds Patient Lines/Drains/Airways Status   Active Line/Drains/Airways    Name:   Placement date:   Placement time:   Site:   Days:   Peripheral IV 04/18/19 Right Antecubital   04/18/19    0202    Antecubital   30          Intake/Output Last 24 hours No intake or output data in the 24 hours ending 05/18/19 1403  Labs/Imaging Results for orders placed or performed during the hospital encounter of 05/18/19 (from the past 48 hour(s))  Basic metabolic panel     Status: Abnormal   Collection Time: 05/18/19  9:26 AM  Result Value Ref Range   Sodium 139 135 - 145 mmol/L   Potassium 4.0 3.5 - 5.1 mmol/L   Chloride 102 98 - 111 mmol/L   CO2 27 22 - 32 mmol/L   Glucose, Bld 199 (H) 70 - 99 mg/dL   BUN 48 (H) 8 - 23 mg/dL   Creatinine, Ser 1.56 (H) 0.44 -  1.00 mg/dL   Calcium 9.3 8.9 - 10.3 mg/dL   GFR calc non Af Amer 35 (L) >60 mL/min   GFR calc Af Amer 40 (L) >60 mL/min   Anion gap 10 5 - 15    Comment: Performed at [facility], [address] [place], [place] [address]  CBC with Differential     Status: Abnormal   Collection Time: 05/18/19  9:26 AM  Result Value Ref Range   WBC 4.8 4.0 - 10.5 K/uL   RBC 3.46 (L) 3.87 - 5.11 MIL/uL   Hemoglobin 9.6 (L) 12.0 - 15.0 g/dL   HCT 31.4 (L) 36.0 - 46.0 %   MCV 90.8 80.0 - 100.0 fL   MCH 27.7 26.0 - 34.0 pg   MCHC 30.6 30.0 - 36.0 g/dL   RDW 15.7 (H) 11.5 - 15.5 %   Platelets 266 150 - 400 K/uL   nRBC 0.0 0.0 - 0.2 %   Neutrophils Relative % 81 %   Neutro Abs 3.9 1.7 - 7.7 K/uL   Lymphocytes Relative 10 %   Lymphs Abs 0.5 (L) 0.7 - 4.0 K/uL   Monocytes Relative 6 %   Monocytes Absolute 0.3 0.1 - 1.0 K/uL   Eosinophils Relative 2 %   Eosinophils Absolute 0.1 0.0 - 0.5 K/uL   Basophils Relative 1 %   Basophils Absolute 0.0 0.0 - 0.1 K/uL   Immature Granulocytes 0 %   Abs Immature Granulocytes 0.02 0.00 - 0.07 K/uL    Comment: Performed at [facility], [place] [address], [place], [place] [address]  Troponin I (High Sensitivity)     Status: None   Collection Time: 05/18/19  9:26 AM  Result Value Ref Range   Troponin I (High Sensitivity) 11 <18 ng/L    Comment: (NOTE) Elevated high sensitivity troponin I (hsTnI) values and significant  changes across serial measurements may suggest ACS but many other  chronic and acute conditions are known to elevate hsTnI results.  Refer to the "Links" section for chest pain algorithms and additional  guidance. Performed at [facility], [place] [address], [place], [place] [address]   Brain natriuretic peptide     Status: Abnormal   Collection Time: 05/18/19  9:26 AM  Result Value Ref Range   B Natriuretic Peptide 875.8 (H) 0.0 - 100.0 pg/mL    Comment: Performed at [facility] [address] [place], [place] [address]   Troponin I (High Sensitivity)     Status: None   Collection Time: 05/18/19 11:47 AM  Result Value Ref Range   Troponin I (High Sensitivity) 12 <18 ng/L    Comment: (NOTE) Elevated high sensitivity troponin I (hsTnI) values and significant  changes across serial measurements may suggest ACS but many other  chronic and acute conditions are known to elevate hsTnI results.  Refer to the "Links" section for chest pain  algorithms and additional  guidance. Performed at [facility], [place] [address] [place], [place] [address]   POC SARS Coronavirus 2 Ag-ED - Nasal Swab (BD Veritor Kit)     Status: None   Collection Time: 05/18/19 12:12 PM  Result Value Ref Range   SARS Coronavirus 2 Ag NEGATIVE NEGATIVE    Comment: (NOTE) SARS-CoV-2 antigen NOT DETECTED.  Negative results are presumptive.  Negative results do not preclude SARS-CoV-2 infection and should not be used as the sole basis for treatment or other patient management decisions, including infection  control decisions, particularly in the presence of clinical signs and  symptoms consistent with COVID-19, or in those who have been in contact with the virus.  Negative results must be combined with clinical observations, patient history, and epidemiological information. The expected result is Negative. Fact Sheet for Patients: PodPark.tn Fact Sheet for Healthcare Providers: GiftContent.is This test is not yet approved or cleared by the [place] FDA and  has been authorized for detection and/or diagnosis of SARS-CoV-2 by FDA under an Emergency Use Authorization (EUA).  This EUA will remain in effect (meaning this test can be used) for the duration of  the COVID-19 de claration under Section 564(b)(1) of the Act, 21 U.S.C. section 360bbb-3(b)(1), unless the authorization is terminated or revoked sooner.    DG Chest Portable 1 View  Result Date: 05/18/2019 CLINICAL  DATA:  Shortness of breath. EXAM: PORTABLE CHEST 1 VIEW COMPARISON:  04/07/2019 FINDINGS: Heart size is normal. Small bilateral pleural effusions and mild interstitial edema. Airspace density within the left lower lobe is noted which may represent atelectasis or pneumonia. IMPRESSION: 1. Suspect mild CHF. 2. Left lower lobe atelectasis versus pneumonia. Electronically Signed   By: Kerby Moors M.D.   On: 05/18/2019 10:04    Pending Labs Unresulted Labs (From admission, onward)    Start     Ordered   05/19/19 1021  Basic metabolic panel  Daily,   R     05/18/19 1240   05/18/19 1258  SARS CORONAVIRUS 2 (TAT 6-24 HRS) Nasopharyngeal Nasopharyngeal Swab  (Tier 3 (TAT 6-24 hrs))  Once,   STAT    Question Answer Comment  Is this test for diagnosis or screening Screening   Symptomatic for COVID-19 as defined by CDC No   Hospitalized for COVID-19 No   Admitted to ICU for COVID-19 No   Previously tested for COVID-19 Yes   Resident in a congregate (group) care setting Unknown   Employed in healthcare setting Unknown   Pregnant No      05/18/19 1257          Vitals/Pain Today's Vitals   05/18/19 1200 05/18/19 1300 05/18/19 1330 05/18/19 1400  BP: (!) 190/78 (!) 151/72 (!) 155/76 (!) 171/78  Pulse: 73 75 77 75  Resp: (!) 29 (!) 27 (!) 23 18  Temp:      TempSrc:      SpO2: 100% 99% 96% 100%  Weight:      Height:      PainSc:        Isolation Precautions No active isolations  Medications Medications  allopurinol (ZYLOPRIM) tablet 300 mg (300 mg Oral Given 05/18/19 1306)  aspirin EC tablet 81 mg (81 mg Oral Given 05/18/19 1306)  atorvastatin (LIPITOR) tablet 80 mg (has no administration in time range)  carvedilol (COREG) tablet 25 mg (has no administration in time range)  DULoxetine (CYMBALTA) DR capsule 30 mg (30 mg Oral Given 05/18/19 1307)  insulin glargine (LANTUS) injection 35 Units (has no administration in time range)  pantoprazole (PROTONIX) EC tablet 20 mg (20 mg Oral Given  05/18/19 1324)  metoCLOPramide (REGLAN) tablet 5 mg (has no administration in time range)  polyethylene glycol (MIRALAX / GLYCOLAX) packet 17 g (has no administration in time range)  gabapentin (NEURONTIN) capsule 100 mg (100 mg Oral Given 05/18/19 1306)  multivitamin with minerals tablet 1 tablet (1 tablet Oral Given 05/18/19 1306)  sodium chloride (OCEAN) 0.65 % nasal spray 1 spray (has no administration in time range)  sodium chloride flush (NS) 0.9 % injection 3 mL (3 mLs Intravenous Given 05/18/19 1307)  sodium chloride flush (NS) 0.9 % injection 3 mL (has no administration in time range)  0.9 %  sodium chloride infusion (has no administration in time range)  acetaminophen (TYLENOL) tablet 650 mg (has no administration in time range)  ondansetron (ZOFRAN) injection 4 mg (has no administration in time range)  heparin injection 5,000 Units (5,000 Units Subcutaneous Given 05/18/19 1308)  furosemide (LASIX) injection 80 mg (has no administration in time range)  isosorbide mononitrate (IMDUR) 24 hr tablet 30 mg (30 mg Oral Given 05/18/19 1306)  hydrALAZINE (APRESOLINE) tablet 10 mg (10 mg Oral Given 05/18/19 1324)  insulin aspart (novoLOG) injection 0-9 Units (has no administration in time range)  furosemide (LASIX) injection 80 mg (80 mg Intravenous Given 05/18/19 1009)  LORazepam (ATIVAN) injection 0.5 mg (0.5 mg Intravenous Given 05/18/19 1324)    Mobility walks Low fall risk   Focused Assessments Pulmonary Assessment Handoff:  Lung sounds: Bilateral Breath Sounds: Diminished L Breath Sounds: Diminished R Breath Sounds: Diminished O2 Device: Nasal Cannula O2 Flow Rate (L/min): 4 L/min      R Recommendations: See Admitting Provider Note  Report given to:   Additional Notes:

## 2019-05-19 DIAGNOSIS — Z79899 Other long term (current) drug therapy: Secondary | ICD-10-CM | POA: Diagnosis not present

## 2019-05-19 DIAGNOSIS — I5043 Acute on chronic combined systolic (congestive) and diastolic (congestive) heart failure: Secondary | ICD-10-CM | POA: Diagnosis present

## 2019-05-19 DIAGNOSIS — N183 Chronic kidney disease, stage 3 unspecified: Secondary | ICD-10-CM | POA: Diagnosis present

## 2019-05-19 DIAGNOSIS — E669 Obesity, unspecified: Secondary | ICD-10-CM | POA: Diagnosis present

## 2019-05-19 DIAGNOSIS — I252 Old myocardial infarction: Secondary | ICD-10-CM | POA: Diagnosis not present

## 2019-05-19 DIAGNOSIS — E1140 Type 2 diabetes mellitus with diabetic neuropathy, unspecified: Secondary | ICD-10-CM | POA: Diagnosis present

## 2019-05-19 DIAGNOSIS — Z823 Family history of stroke: Secondary | ICD-10-CM | POA: Diagnosis not present

## 2019-05-19 DIAGNOSIS — Z8673 Personal history of transient ischemic attack (TIA), and cerebral infarction without residual deficits: Secondary | ICD-10-CM | POA: Diagnosis not present

## 2019-05-19 DIAGNOSIS — R0789 Other chest pain: Secondary | ICD-10-CM | POA: Diagnosis present

## 2019-05-19 DIAGNOSIS — Z7982 Long term (current) use of aspirin: Secondary | ICD-10-CM | POA: Diagnosis not present

## 2019-05-19 DIAGNOSIS — R251 Tremor, unspecified: Secondary | ICD-10-CM | POA: Diagnosis present

## 2019-05-19 DIAGNOSIS — I130 Hypertensive heart and chronic kidney disease with heart failure and stage 1 through stage 4 chronic kidney disease, or unspecified chronic kidney disease: Secondary | ICD-10-CM | POA: Diagnosis present

## 2019-05-19 DIAGNOSIS — M109 Gout, unspecified: Secondary | ICD-10-CM | POA: Diagnosis present

## 2019-05-19 DIAGNOSIS — I10 Essential (primary) hypertension: Secondary | ICD-10-CM

## 2019-05-19 DIAGNOSIS — Z794 Long term (current) use of insulin: Secondary | ICD-10-CM | POA: Diagnosis not present

## 2019-05-19 DIAGNOSIS — E6609 Other obesity due to excess calories: Secondary | ICD-10-CM

## 2019-05-19 DIAGNOSIS — E1165 Type 2 diabetes mellitus with hyperglycemia: Secondary | ICD-10-CM | POA: Diagnosis present

## 2019-05-19 DIAGNOSIS — Z885 Allergy status to narcotic agent status: Secondary | ICD-10-CM | POA: Diagnosis not present

## 2019-05-19 DIAGNOSIS — E1159 Type 2 diabetes mellitus with other circulatory complications: Secondary | ICD-10-CM | POA: Diagnosis not present

## 2019-05-19 DIAGNOSIS — I5023 Acute on chronic systolic (congestive) heart failure: Secondary | ICD-10-CM | POA: Diagnosis present

## 2019-05-19 DIAGNOSIS — E1122 Type 2 diabetes mellitus with diabetic chronic kidney disease: Secondary | ICD-10-CM | POA: Diagnosis present

## 2019-05-19 DIAGNOSIS — I5021 Acute systolic (congestive) heart failure: Secondary | ICD-10-CM

## 2019-05-19 DIAGNOSIS — I509 Heart failure, unspecified: Secondary | ICD-10-CM | POA: Diagnosis not present

## 2019-05-19 DIAGNOSIS — E1129 Type 2 diabetes mellitus with other diabetic kidney complication: Secondary | ICD-10-CM | POA: Diagnosis not present

## 2019-05-19 DIAGNOSIS — E8770 Fluid overload, unspecified: Secondary | ICD-10-CM | POA: Diagnosis present

## 2019-05-19 DIAGNOSIS — J9611 Chronic respiratory failure with hypoxia: Secondary | ICD-10-CM | POA: Diagnosis present

## 2019-05-19 DIAGNOSIS — E7800 Pure hypercholesterolemia, unspecified: Secondary | ICD-10-CM | POA: Diagnosis present

## 2019-05-19 DIAGNOSIS — Z91018 Allergy to other foods: Secondary | ICD-10-CM | POA: Diagnosis not present

## 2019-05-19 DIAGNOSIS — Z833 Family history of diabetes mellitus: Secondary | ICD-10-CM | POA: Diagnosis not present

## 2019-05-19 DIAGNOSIS — Z20822 Contact with and (suspected) exposure to covid-19: Secondary | ICD-10-CM | POA: Diagnosis present

## 2019-05-19 DIAGNOSIS — E785 Hyperlipidemia, unspecified: Secondary | ICD-10-CM | POA: Diagnosis present

## 2019-05-19 HISTORY — DX: Acute systolic (congestive) heart failure: I50.21

## 2019-05-19 LAB — BASIC METABOLIC PANEL
Anion gap: 8 (ref 5–15)
BUN: 48 mg/dL — ABNORMAL HIGH (ref 8–23)
CO2: 29 mmol/L (ref 22–32)
Calcium: 9.2 mg/dL (ref 8.9–10.3)
Chloride: 104 mmol/L (ref 98–111)
Creatinine, Ser: 1.74 mg/dL — ABNORMAL HIGH (ref 0.44–1.00)
GFR calc Af Amer: 35 mL/min — ABNORMAL LOW (ref >60)
GFR calc non Af Amer: 30 mL/min — ABNORMAL LOW (ref >60)
Glucose, Bld: 104 mg/dL — ABNORMAL HIGH (ref 70–99)
Potassium: 4.1 mmol/L (ref 3.5–5.1)
Sodium: 141 mmol/L (ref 135–145)

## 2019-05-19 LAB — GLUCOSE, CAPILLARY
Glucose-Capillary: 95 mg/dL (ref 70–99)
Glucose-Capillary: 69 mg/dL — ABNORMAL LOW (ref 70–99)
Glucose-Capillary: 120 mg/dL — ABNORMAL HIGH (ref 70–99)
Glucose-Capillary: 165 mg/dL — ABNORMAL HIGH (ref 70–99)
Glucose-Capillary: 129 mg/dL — ABNORMAL HIGH (ref 70–99)

## 2019-05-19 MED ORDER — INSULIN GLARGINE 100 UNIT/ML ~~LOC~~ SOLN
25.0000 [IU] | Freq: Every day | SUBCUTANEOUS | Status: DC
  Administered 2019-05-19: 22:00:00 25 [IU] via SUBCUTANEOUS
  Filled 2019-05-19 (×2): qty 0.25

## 2019-05-19 NOTE — [facility] Notes (Addendum)
Hypoglycemic Event  CBG: 69  Treatment: 4 oz juice/soda  Symptoms: Shaky  Follow-up CBG: Time:15 CBG Result:120  Possible Reasons for Event: Inadequate meal intake, Encouraged patient to eat more.   Comments/MD notified:per protocol     Kara Hanson

## 2019-05-19 NOTE — Progress Notes (Addendum)
ReDS Clip Diuretic Study Pt study # [id number]  Your patient is in the Blinded arm of the ReDS Clip Diuretic study.  Your patient has had a ReDS reading and the reading has been transmitted to the cloud.     Thank You     Bonnita Nasuti Pharm.D. CPP, BCPS Clinical Pharmacist [phone] 05/19/2019 2:07 PM

## 2019-05-19 NOTE — Progress Notes (Signed)
Progress Note    Kara Hanson  VVO:[id number] DOB: 1955/09/07  DOA: 05/18/2019 PCP: [name], MD    Brief Narrative:     Medical records reviewed and are as summarized below:  Kara Hanson is an 64 y.o. female with medical history significant of IDDM, hypertension, congestive heart failure (EF40-45% from echo of1/2021, COVID-19 related pneumonia in 07/2018, presented with increasing short of breath.  Patient was hospitalized last month for CHF decompensation, upon discharge, her Lasix dosage increased to 80 mg twice daily.  Over the last 2 weeks, she has had increasing short of breath on exertion especially with getting up to walk to the bathroom despite on 4 L supplemental oxygen which was her routine. Earlier this morning her power went out and she did not receive her supplemental oxygen for some time, called out to EMS for evaluation and was found to be hypoxic 84% on room air but improved with application of supplemental oxygen via nasal cannula.  Assessment/Plan:   Active Problems:   Acute CHF (congestive heart failure) (HCC)   CHF (congestive heart failure) (HCC)   Acute on chronic CHF:  Systolic -Patient had an echocardiogram in January 2021 with an EF of 40% -Signs of fluid overload on clinical exam  -Continue IV Lasix 80 mg twice daily -Daily weight and strict I's and O's  Chronic respiratory failure -Patient is on 4 L O2 at home   uncontrolled HTN Resolved  Atypical chest pain Most likely related to CHF decompensation, 2 sets troponin negative, EKG no acute ST changes. Treat CHF -Consideration for pulmonary emboli although timing does not appear to be likely (pain started after she was already hypoxic with EMS)  CKD stage III B Monitor levels while patient is being diuresed  Uncontrolled type 2 diabetes With proteinuria and hyperglycemia Indication for tighter blood pressure control Lantus plus sliding scale  DM neuropathy -renal dosed  gabapentin  Gout Continue allopurinol  obesity Body mass index is 32.09 kg/m.   Family Communication/Anticipated D/C date and plan/Code Status   DVT prophylaxis: Heparin Code Status: Full Code.  Family Communication: None at bedside Disposition Plan: Needs continued IV diuresis with close monitoring of I's and O's and weight: Improvement in respiratory status   Medical Consultants:    None.     Subjective:   Still feeling very short of breath with exertion  Objective:    Vitals:   05/19/19 0355 05/19/19 0637 05/19/19 0754 05/19/19 0853  BP: 126/64 127/65 (!) 137/59 127/63  Pulse: 74  71 70  Resp: 20  19   Temp: 98.2 F (36.8 C)  97.8 F (36.6 C)   TempSrc: Oral  Oral   SpO2: 96%  98% 97%  Weight: 72.1 kg     Height:        Intake/Output Summary (Last 24 hours) at 05/19/2019 1019 Last data filed at 05/19/2019 1012 Gross per 24 hour  Intake 230 ml  Output 600 ml  Net -370 ml   Filed Weights   05/18/19 0914 05/19/19 0355  Weight: 72.6 kg 72.1 kg    Exam: In bed, ill but not toxic appearing Regular rate and rhythm Crackles at bases but no wheezing Positive bowel sounds soft nontender Alert and oriented x3 Positive lower extremity edema with pitting  Data Reviewed:   I have personally reviewed following labs and imaging studies:  Labs: Labs show the following:   Basic Metabolic Panel: Recent Labs  Lab 05/18/19 0926 05/19/19 0529  NA 139 141  K 4.0 4.1  CL 102 104  CO2 27 29  GLUCOSE 199* 104*  BUN 48* 48*  CREATININE 1.56* 1.74*  CALCIUM 9.3 9.2   GFR Estimated Creatinine Clearance: 28.3 mL/min (A) (by C-G formula based on SCr of 1.74 mg/dL (H)). Liver Function Tests: No results for input(s): AST, ALT, ALKPHOS, BILITOT, PROT, ALBUMIN in the last 168 hours. No results for input(s): LIPASE, AMYLASE in the last 168 hours. No results for input(s): AMMONIA in the last 168 hours. Coagulation profile No results for input(s): INR,  PROTIME in the last 168 hours.  CBC: Recent Labs  Lab 05/18/19 0926  WBC 4.8  NEUTROABS 3.9  HGB 9.6*  HCT 31.4*  MCV 90.8  PLT 266   Cardiac Enzymes: No results for input(s): CKTOTAL, CKMB, CKMBINDEX, TROPONINI in the last 168 hours. BNP (last 3 results) No results for input(s): PROBNP in the last 8760 hours. CBG: Recent Labs  Lab 05/18/19 1701 05/18/19 2119 05/19/19 0614  GLUCAP 116* 144* 95   D-Dimer: No results for input(s): DDIMER in the last 72 hours. Hgb A1c: No results for input(s): HGBA1C in the last 72 hours. Lipid Profile: No results for input(s): CHOL, HDL, LDLCALC, TRIG, CHOLHDL, LDLDIRECT in the last 72 hours. Thyroid function studies: No results for input(s): TSH, T4TOTAL, T3FREE, THYROIDAB in the last 72 hours.  Invalid input(s): FREET3 Anemia work up: No results for input(s): VITAMINB12, FOLATE, FERRITIN, TIBC, IRON, RETICCTPCT in the last 72 hours. Sepsis Labs: Recent Labs  Lab 05/18/19 0926  WBC 4.8    Microbiology Recent Results (from the past 240 hour(s))  SARS CORONAVIRUS 2 (TAT 6-24 HRS) Nasopharyngeal Nasopharyngeal Swab     Status: None   Collection Time: 05/18/19  1:12 PM   Specimen: Nasopharyngeal Swab  Result Value Ref Range Status   SARS Coronavirus 2 NEGATIVE NEGATIVE Final    Comment: (NOTE) SARS-CoV-2 target nucleic acids are NOT DETECTED. The SARS-CoV-2 RNA is generally detectable in upper and lower respiratory specimens during the acute phase of infection. Negative results do not preclude SARS-CoV-2 infection, do not rule out co-infections with other pathogens, and should not be used as the sole basis for treatment or other patient management decisions. Negative results must be combined with clinical observations, patient history, and epidemiological information. The expected result is Negative. Fact Sheet for Patients: SugarRoll.be Fact Sheet for Healthcare  Providers: https://www.woods-mathews.com/ This test is not yet approved or cleared by the [place] FDA and  has been authorized for detection and/or diagnosis of SARS-CoV-2 by FDA under an Emergency Use Authorization (EUA). This EUA will remain  in effect (meaning this test can be used) for the duration of the COVID-19 declaration under Section 56 4(b)(1) of the Act, 21 U.S.C. section 360bbb-3(b)(1), unless the authorization is terminated or revoked sooner. Performed at [facility], [place] [address], [place], [place] [address]     Procedures and diagnostic studies:  DG Chest Portable 1 View  Result Date: 05/18/2019 CLINICAL DATA:  Shortness of breath. EXAM: PORTABLE CHEST 1 VIEW COMPARISON:  04/07/2019 FINDINGS: Heart size is normal. Small bilateral pleural effusions and mild interstitial edema. Airspace density within the left lower lobe is noted which may represent atelectasis or pneumonia. IMPRESSION: 1. Suspect mild CHF. 2. Left lower lobe atelectasis versus pneumonia. Electronically Signed   By: Kerby Moors M.D.   On: 05/18/2019 10:04    Medications:   . allopurinol  300 mg Oral Daily  . aspirin EC  81 mg Oral Daily  .  atorvastatin  80 mg Oral q1800  . carvedilol  25 mg Oral BID WC  . DULoxetine  30 mg Oral Daily  . furosemide  80 mg Intravenous BID  . gabapentin  100 mg Oral BID  . heparin  5,000 Units Subcutaneous Q8H  . hydrALAZINE  10 mg Oral Q6H  . insulin aspart  0-9 Units Subcutaneous TID WC  . insulin glargine  35 Units Subcutaneous QHS  . isosorbide mononitrate  30 mg Oral Daily  . metoCLOPramide  5 mg Oral TID  . multivitamin with minerals  1 tablet Oral Daily  . pantoprazole  20 mg Oral Daily  . sodium chloride flush  3 mL Intravenous Q12H   Continuous Infusions: . sodium chloride       LOS: 0 days   Geradine Girt  [facility]   How to contact the [facility] Attending or Consulting provider [place] or covering provider during  after hours [place], for this patient?  1. Check the care team in [facility] and look for a) attending/consulting TRH provider listed and b) the [facility] team listed 2. Log into [email or web address] and use [place]'s universal password to access. If you do not have the password, please contact the hospital operator. 3. Locate the [facility] provider you are looking for under [facility] and page to a number that you can be directly reached. 4. If you still have difficulty reaching the provider, please page the [facility] (Director on Call) for the Hospitalists listed on amion for assistance.  05/19/2019, 10:19 AM

## 2019-05-19 NOTE — Progress Notes (Signed)
Patient has "tremors" that she explains are " normal for her" but they gotten worse for the past two days. Blood sugar is normal as well as vital signs. Patent mentioned that she was seeing neurologist and taking medications for this but she stopped. Informed MD Eliseo Squires

## 2019-05-20 ENCOUNTER — Encounter (HOSPITAL_COMMUNITY): Payer: Self-pay | Admitting: Internal Medicine

## 2019-05-20 ENCOUNTER — Telehealth: Payer: Medicare Other | Admitting: Cardiology

## 2019-05-20 DIAGNOSIS — E1129 Type 2 diabetes mellitus with other diabetic kidney complication: Secondary | ICD-10-CM

## 2019-05-20 DIAGNOSIS — Z794 Long term (current) use of insulin: Secondary | ICD-10-CM

## 2019-05-20 LAB — URINALYSIS, ROUTINE W REFLEX MICROSCOPIC
Bilirubin Urine: NEGATIVE
Glucose, UA: 50 mg/dL — AB
Ketones, ur: NEGATIVE mg/dL
Nitrite: NEGATIVE
Protein, ur: >=300 mg/dL — AB
Specific Gravity, Urine: 1.011 (ref 1.005–1.030)
pH: 6 (ref 5.0–8.0)

## 2019-05-20 LAB — BASIC METABOLIC PANEL
Anion gap: 10 (ref 5–15)
BUN: 53 mg/dL — ABNORMAL HIGH (ref 8–23)
CO2: 29 mmol/L (ref 22–32)
Calcium: 9 mg/dL (ref 8.9–10.3)
Chloride: 102 mmol/L (ref 98–111)
Creatinine, Ser: 2.06 mg/dL — ABNORMAL HIGH (ref 0.44–1.00)
GFR calc Af Amer: 29 mL/min — ABNORMAL LOW (ref >60)
GFR calc non Af Amer: 25 mL/min — ABNORMAL LOW (ref >60)
Glucose, Bld: 57 mg/dL — ABNORMAL LOW (ref 70–99)
Potassium: 4.3 mmol/L (ref 3.5–5.1)
Sodium: 141 mmol/L (ref 135–145)

## 2019-05-20 LAB — GLUCOSE, CAPILLARY
Glucose-Capillary: 59 mg/dL — ABNORMAL LOW (ref 70–99)
Glucose-Capillary: 98 mg/dL (ref 70–99)
Glucose-Capillary: 89 mg/dL (ref 70–99)
Glucose-Capillary: 92 mg/dL (ref 70–99)
Glucose-Capillary: 89 mg/dL (ref 70–99)

## 2019-05-20 MED ORDER — GABAPENTIN 100 MG PO CAPS
100.0000 mg | ORAL_CAPSULE | Freq: Every day | ORAL | Status: DC
  Administered 2019-05-20 – 2019-05-21 (×2): 100 mg via ORAL
  Filled 2019-05-20 (×2): qty 1

## 2019-05-20 MED ORDER — INSULIN GLARGINE 100 UNIT/ML ~~LOC~~ SOLN
10.0000 [IU] | Freq: Every day | SUBCUTANEOUS | Status: DC
  Administered 2019-05-21: 22:00:00 10 [IU] via SUBCUTANEOUS
  Filled 2019-05-20 (×2): qty 0.1

## 2019-05-20 MED ORDER — FUROSEMIDE 10 MG/ML IJ SOLN
60.0000 mg | Freq: Two times a day (BID) | INTRAMUSCULAR | Status: AC
  Administered 2019-05-20 – 2019-05-21 (×3): 60 mg via INTRAVENOUS
  Filled 2019-05-20 (×3): qty 6

## 2019-05-20 NOTE — Progress Notes (Signed)
ReDS Clip Diuretic Study Pt study # [id number]  Your patient is in the Blinded arm of the ReDS Clip Diuretic study.  Your patient has had a ReDS reading and the reading has been transmitted to the cloud.     Thank You     Bonnita Nasuti Pharm.D. CPP, BCPS Clinical Pharmacist [phone] 05/20/2019 8:31 AM

## 2019-05-20 NOTE — Progress Notes (Signed)
Hypoglycemic Event  CBG: 59  Treatment: 4 oz juice/soda  Symptoms: None  Follow-up CBG: Time:0850 CBG Result:98  Possible Reasons for Event: Unknown  Comments/MD notified:yes    Kara Hanson

## 2019-05-20 NOTE — Evaluation (Signed)
Physical Therapy Evaluation Patient Details Name: Kara Hanson MRN: [id number] DOB: [birth year] Today's Date: 05/20/2019   History of Present Illness  Pt is a 64 y/o female admitted for shortness of breath after her power went out and did not receive supplemental oxygen. Pt also with signs of CHF exacerbation. PMH includes HTN, CHF, DM, CKD, COVID, and respiratory failure on 4L of O2.   Clinical Impression  Pt admitted secondary to problem above with deficits below. Pt requiring min to min guard A for mobility. Pt reaching out for objects to hold in room; educated about using RW at home to increase safety. Pt oxygen sats at 90-95% on 4L of oxygen. Will continue to follow acutely to maximize functional mobility independence and safety.     Follow Up Recommendations Home health PT;Supervision for mobility/OOB    Equipment Recommendations  None recommended by PT    Recommendations for Other Services       Precautions / Restrictions Precautions Precautions: Fall Restrictions Weight Bearing Restrictions: No      Mobility  Bed Mobility Overal bed mobility: Needs Assistance Bed Mobility: Supine to Sit;Sit to Supine     Supine to sit: Supervision Sit to supine: Supervision   General bed mobility comments: Supervision for safety.   Transfers Overall transfer level: Needs assistance Equipment used: Rolling walker (2 wheeled) Transfers: Sit to/from Stand Sit to Stand: Min assist         General transfer comment: Min A for steadying assist.   Ambulation/Gait Ambulation/Gait assistance: Min guard Gait Distance (Feet): 20 Feet(X2) Assistive device: None Gait Pattern/deviations: Step-through pattern;Decreased stride length Gait velocity: Decreased   General Gait Details: Slow, very cautious gait. Pt reaching out for furniture to hold on to during gait. Educated about using RW at home to increase safety. Oxygen sats ranging from 90-95% on 4L. DOE at 2/4.   Stairs            Wheelchair Mobility    Modified Rankin (Stroke Patients Only)       Balance Overall balance assessment: Needs assistance Sitting-balance support: No upper extremity supported;Feet supported Sitting balance-Leahy Scale: Good     Standing balance support: No upper extremity supported;During functional activity Standing balance-Leahy Scale: Fair                               Pertinent Vitals/Pain Pain Assessment: No/denies pain    Home Living Family/patient expects to be discharged to:: Private residence Living Arrangements: Children;Other relatives(grandson) Available Help at Discharge: Family;Available 24 hours/day Type of Home: House Home Access: Stairs to enter Entrance Stairs-Rails: Right Entrance Stairs-Number of Steps: 1 Home Layout: One level Home Equipment: Walker - 2 wheels      Prior Function Level of Independence: Independent         Comments: Uses 4 L of oxygen at home     Hand Dominance   Dominant Hand: Right    Extremity/Trunk Assessment   Upper Extremity Assessment Upper Extremity Assessment: Overall WFL for tasks assessed    Lower Extremity Assessment Lower Extremity Assessment: Generalized weakness    Cervical / Trunk Assessment Cervical / Trunk Assessment: Normal  Communication   Communication: No difficulties  Cognition Arousal/Alertness: Awake/alert Behavior During Therapy: WFL for tasks assessed/performed Overall Cognitive Status: Within Functional Limits for tasks assessed  General Comments      Exercises     Assessment/Plan    PT Assessment Patient needs continued PT services  PT Problem List Cardiopulmonary status limiting activity;Decreased strength;Decreased activity tolerance;Decreased balance;Decreased mobility;Decreased knowledge of use of DME       PT Treatment Interventions DME instruction;Gait training;Stair training;Functional mobility  training;Therapeutic activities;Therapeutic exercise;Balance training;Patient/family education    PT Goals (Current goals can be found in the Care Plan section)  Acute Rehab PT Goals Patient Stated Goal: to go home PT Goal Formulation: With patient Time For Goal Achievement: 06/03/19 Potential to Achieve Goals: Good    Frequency Min 3X/week   Barriers to discharge        Co-evaluation               AM-PAC PT "6 Clicks" Mobility  Outcome Measure Help needed turning from your back to your side while in a flat bed without using bedrails?: None Help needed moving from lying on your back to sitting on the side of a flat bed without using bedrails?: None Help needed moving to and from a bed to a chair (including a wheelchair)?: A Little Help needed standing up from a chair using your arms (e.g., wheelchair or bedside chair)?: A Little Help needed to walk in hospital room?: A Little Help needed climbing 3-5 steps with a railing? : A Lot 6 Click Score: 19    End of Session Equipment Utilized During Treatment: Gait belt;Oxygen Activity Tolerance: Patient tolerated treatment well Patient left: in bed;with call bell/phone within reach Nurse Communication: Mobility status PT Visit Diagnosis: Unsteadiness on feet (R26.81);Muscle weakness (generalized) (M62.81)    Time: 0211-1735 PT Time Calculation (min) (ACUTE ONLY): 16 min   Charges:   PT Evaluation $PT Eval Moderate Complexity: 1 Mod          Reuel Derby, PT, DPT  Acute Rehabilitation Services  Pager: [phone] Office: [phone]   Rudean Hitt 05/20/2019, 4:33 PM

## 2019-05-20 NOTE — Progress Notes (Signed)
Progress Note    Kara Hanson  HER:[id number] DOB: Jun 03, 1955  DOA: 05/18/2019 PCP: [name], MD    Brief Narrative:     Medical records reviewed and are as summarized below:  Kara Hanson is an 64 y.o. female with medical history significant of IDDM, hypertension, congestive heart failure (EF40-45% from echo of1/2021, COVID-19 related pneumonia in 07/2018, presented with increasing short of breath.  Patient was hospitalized last month for CHF decompensation, upon discharge, her Lasix dosage increased to 80 mg twice daily.  Over the last 2 weeks, she has had increasing short of breath on exertion especially with getting up to walk to the bathroom despite on 4 L supplemental oxygen which was her routine. Earlier this morning her power went out and she did not receive her supplemental oxygen for some time, called out to EMS for evaluation and was found to be hypoxic 84% on room air but improved with application of supplemental oxygen via nasal cannula.  Assessment/Plan:   Active Problems:   Acute CHF (congestive heart failure) (HCC)   CHF (congestive heart failure) (HCC)   Acute systolic CHF (congestive heart failure) (HCC)   Acute on chronic CHF:  Systolic -Patient had an echocardiogram in January 2021 with an EF of 40% -Signs of fluid overload on clinical exam  -Continue IV Lasix 60 mg twice daily -Daily weight and strict I's and O's  Chronic respiratory failure -Patient is on 4 L O2 at home  -appear more comfortable today  uncontrolled HTN Resolved -Question compliance at home  Atypical chest pain Most likely related to CHF decompensation, 2 sets troponin negative, EKG no acute ST changes. Treat CHF -Consideration for pulmonary emboli although timing does not appear to be likely (pain started after she was already hypoxic with EMS)  CKD stage III B Monitor levels while patient is being diuresed  Uncontrolled type 2 diabetes With proteinuria and  hyperglycemia -Home regimen was resumed and now patient has hypoglycemia -Patient states she is compliant at home -Reduce Lantus  DM neuropathy -renal dosed gabapentin -Change to nightly  Gout Continue allopurinol  obesity Body mass index is 31.51 kg/m.  Tremor -Patient states she has outpatient follow-up with neurology next week   This is patient's seventh hospitalization in the last year.  Family Communication/Anticipated D/C date and plan/Code Status   DVT prophylaxis: Heparin Code Status: Full Code.  Family Communication: None at bedside Disposition Plan: Needs continued IV diuresis with close monitoring of I's and O's and weight: Improvement in respiratory status   Medical Consultants:    None.     Subjective:   Says her breathing has improved. Says she has a chronic tremor that she is to follow-up outpatient with neurology for  Objective:    Vitals:   05/19/19 2017 05/20/19 0058 05/20/19 0516 05/20/19 0900  BP: (!) 105/54 129/66 129/61 132/62  Pulse: 64 66 66 73  Resp: 20 20 20    Temp: 98.3 F (36.8 C) 98.6 F (37 C) 98.1 F (36.7 C)   TempSrc: Oral Oral Oral   SpO2: 100% 100% 99%   Weight:  70.8 kg    Height:        Intake/Output Summary (Last 24 hours) at 05/20/2019 1227 Last data filed at 05/20/2019 0900 Gross per 24 hour  Intake 1200 ml  Output 450 ml  Net 750 ml   Filed Weights   05/18/19 0914 05/19/19 0355 05/20/19 0058  Weight: 72.6 kg 72.1 kg 70.8 kg    Exam:  In bed, appears comfortable today Regular rate and rhythm Diminished but no wheezing Has an intention tremor Improved lower extremity edema  Data Reviewed:   I have personally reviewed following labs and imaging studies:  Labs: Labs show the following:   Basic Metabolic Panel: Recent Labs  Lab 05/18/19 0926 05/18/19 0926 05/19/19 0529 05/20/19 0429  NA 139  --  141 141  K 4.0   < > 4.1 4.3  CL 102  --  104 102  CO2 27  --  29 29  GLUCOSE 199*  --   104* 57*  BUN 48*  --  48* 53*  CREATININE 1.56*  --  1.74* 2.06*  CALCIUM 9.3  --  9.2 9.0   < > = values in this interval not displayed.   GFR Estimated Creatinine Clearance: 23.6 mL/min (A) (by C-G formula based on SCr of 2.06 mg/dL (H)). Liver Function Tests: No results for input(s): AST, ALT, ALKPHOS, BILITOT, PROT, ALBUMIN in the last 168 hours. No results for input(s): LIPASE, AMYLASE in the last 168 hours. No results for input(s): AMMONIA in the last 168 hours. Coagulation profile No results for input(s): INR, PROTIME in the last 168 hours.  CBC: Recent Labs  Lab 05/18/19 0926  WBC 4.8  NEUTROABS 3.9  HGB 9.6*  HCT 31.4*  MCV 90.8  PLT 266   Cardiac Enzymes: No results for input(s): CKTOTAL, CKMB, CKMBINDEX, TROPONINI in the last 168 hours. BNP (last 3 results) No results for input(s): PROBNP in the last 8760 hours. CBG: Recent Labs  Lab 05/19/19 1636 05/19/19 2121 05/20/19 0819 05/20/19 0858 05/20/19 1150  GLUCAP 165* 129* 59* 98 89   D-Dimer: No results for input(s): DDIMER in the last 72 hours. Hgb A1c: No results for input(s): HGBA1C in the last 72 hours. Lipid Profile: No results for input(s): CHOL, HDL, LDLCALC, TRIG, CHOLHDL, LDLDIRECT in the last 72 hours. Thyroid function studies: No results for input(s): TSH, T4TOTAL, T3FREE, THYROIDAB in the last 72 hours.  Invalid input(s): FREET3 Anemia work up: No results for input(s): VITAMINB12, FOLATE, FERRITIN, TIBC, IRON, RETICCTPCT in the last 72 hours. Sepsis Labs: Recent Labs  Lab 05/18/19 0926  WBC 4.8    Microbiology Recent Results (from the past 240 hour(s))  SARS CORONAVIRUS 2 (TAT 6-24 HRS) Nasopharyngeal Nasopharyngeal Swab     Status: None   Collection Time: 05/18/19  1:12 PM   Specimen: Nasopharyngeal Swab  Result Value Ref Range Status   SARS Coronavirus 2 NEGATIVE NEGATIVE Final    Comment: (NOTE) SARS-CoV-2 target nucleic acids are NOT DETECTED. The SARS-CoV-2 RNA is  generally detectable in upper and lower respiratory specimens during the acute phase of infection. Negative results do not preclude SARS-CoV-2 infection, do not rule out co-infections with other pathogens, and should not be used as the sole basis for treatment or other patient management decisions. Negative results must be combined with clinical observations, patient history, and epidemiological information. The expected result is Negative. Fact Sheet for Patients: SugarRoll.be Fact Sheet for Healthcare Providers: https://www.woods-mathews.com/ This test is not yet approved or cleared by the [place] FDA and  has been authorized for detection and/or diagnosis of SARS-CoV-2 by FDA under an Emergency Use Authorization (EUA). This EUA will remain  in effect (meaning this test can be used) for the duration of the COVID-19 declaration under Section 56 4(b)(1) of the Act, 21 U.S.C. section 360bbb-3(b)(1), unless the authorization is terminated or revoked sooner. Performed at [facility], [place]  [address] [place], [place] [address]     Procedures and diagnostic studies:  No results found.  Medications:   . allopurinol  300 mg Oral Daily  . aspirin EC  81 mg Oral Daily  . atorvastatin  80 mg Oral q1800  . carvedilol  25 mg Oral BID WC  . DULoxetine  30 mg Oral Daily  . furosemide  60 mg Intravenous BID  . gabapentin  100 mg Oral QHS  . heparin  5,000 Units Subcutaneous Q8H  . hydrALAZINE  10 mg Oral Q6H  . insulin aspart  0-9 Units Subcutaneous TID WC  . [START ON 05/21/2019] insulin glargine  10 Units Subcutaneous QHS  . isosorbide mononitrate  30 mg Oral Daily  . metoCLOPramide  5 mg Oral TID  . multivitamin with minerals  1 tablet Oral Daily  . pantoprazole  20 mg Oral Daily  . sodium chloride flush  3 mL Intravenous Q12H   Continuous Infusions: . sodium chloride       LOS: 1 day   Geradine Girt  [facility]    How to contact the [facility] Attending or Consulting provider [place] or covering provider during after hours [place], for this patient?  1. Check the care team in [facility] and look for a) attending/consulting TRH provider listed and b) the [facility] team listed 2. Log into [email or web address] and use [place]'s universal password to access. If you do not have the password, please contact the hospital operator. 3. Locate the [facility] provider you are looking for under [facility] and page to a number that you can be directly reached. 4. If you still have difficulty reaching the provider, please page the [facility] (Director on Call) for the Hospitalists listed on amion for assistance.  05/20/2019, 12:27 PM

## 2019-05-20 NOTE — Plan of Care (Signed)

## 2019-05-21 LAB — CBC
HCT: 27 % — ABNORMAL LOW (ref 36.0–46.0)
Hemoglobin: 8.4 g/dL — ABNORMAL LOW (ref 12.0–15.0)
MCH: 28.1 pg (ref 26.0–34.0)
MCHC: 31.1 g/dL (ref 30.0–36.0)
MCV: 90.3 fL (ref 80.0–100.0)
Platelets: 212 10*3/uL (ref 150–400)
RBC: 2.99 MIL/uL — ABNORMAL LOW (ref 3.87–5.11)
RDW: 15.6 % — ABNORMAL HIGH (ref 11.5–15.5)
WBC: 4.5 10*3/uL (ref 4.0–10.5)
nRBC: 0 % (ref 0.0–0.2)

## 2019-05-21 LAB — BASIC METABOLIC PANEL
Anion gap: 9 (ref 5–15)
BUN: 55 mg/dL — ABNORMAL HIGH (ref 8–23)
CO2: 30 mmol/L (ref 22–32)
Calcium: 9.1 mg/dL (ref 8.9–10.3)
Chloride: 99 mmol/L (ref 98–111)
Creatinine, Ser: 1.89 mg/dL — ABNORMAL HIGH (ref 0.44–1.00)
GFR calc Af Amer: 32 mL/min — ABNORMAL LOW (ref >60)
GFR calc non Af Amer: 28 mL/min — ABNORMAL LOW (ref >60)
Glucose, Bld: 82 mg/dL (ref 70–99)
Potassium: 4.2 mmol/L (ref 3.5–5.1)
Sodium: 138 mmol/L (ref 135–145)

## 2019-05-21 LAB — GLUCOSE, CAPILLARY
Glucose-Capillary: 77 mg/dL (ref 70–99)
Glucose-Capillary: 134 mg/dL — ABNORMAL HIGH (ref 70–99)
Glucose-Capillary: 109 mg/dL — ABNORMAL HIGH (ref 70–99)
Glucose-Capillary: 131 mg/dL — ABNORMAL HIGH (ref 70–99)

## 2019-05-21 MED ORDER — FUROSEMIDE 80 MG PO TABS
80.0000 mg | ORAL_TABLET | Freq: Two times a day (BID) | ORAL | Status: DC
  Administered 2019-05-22 (×2): 80 mg via ORAL
  Filled 2019-05-21 (×2): qty 1

## 2019-05-21 NOTE — Progress Notes (Signed)
Progress Note    [name]  NTZ:[id number] DOB: July 22, 1955  DOA: 05/18/2019 PCP: [name], MD    Brief Narrative:     Medical records reviewed and are as summarized below:  Kara Hanson is an 64 y.o. female with medical history significant of IDDM, hypertension, congestive heart failure (EF40-45% from echo of1/2021, COVID-19 related pneumonia in 07/2018, presented with increasing short of breath.  Patient was hospitalized last month for CHF decompensation, upon discharge, her Lasix dosage increased to 80 mg twice daily.  Over the last 2 weeks, she has had increasing short of breath on exertion especially with getting up to walk to the bathroom despite on 4 L supplemental oxygen which was her routine. Earlier this morning her power went out and she did not receive her supplemental oxygen for some time, called out to EMS for evaluation and was found to be hypoxic 84% on room air but improved with application of supplemental oxygen via nasal cannula.  Assessment/Plan:   Active Problems:   Acute CHF (congestive heart failure) (HCC)   CHF (congestive heart failure) (HCC)   Acute systolic CHF (congestive heart failure) (HCC)   Acute on chronic CHF:  Systolic -Patient had an echocardiogram in January 2021 with an EF of 40% -Signs of fluid overload on clinical exam  -Continue IV Lasix 60 mg twice daily-- plan to change to oral lasix in the AM -Daily weight and strict I's and O's -does not have a scale at home but will have daughter get one so she can weight daily  Chronic respiratory failure -Patient is on 4 L O2 at home  -appear more comfortable today  uncontrolled HTN Resolved -Question compliance at home  Atypical chest pain Most likely related to CHF decompensation, 2 sets troponin negative, EKG no acute ST changes. Treat CHF -no further episodes of chest pain  CKD stage III B Monitor levels while patient is being diuresed  Uncontrolled type 2  diabetes With proteinuria and hyperglycemia -Home regimen was resumed and now patient has hypoglycemia -Patient states she is compliant at home -Reduce Lantus  DM neuropathy -renal dosed gabapentin -Change to nightly (tremors improved this AM)  Gout Continue allopurinol  obesity Body mass index is 31.47 kg/m.  Tremor -Patient states she has outpatient follow-up with neurology next week   This is patient's seventh hospitalization in the last year.  Family Communication/Anticipated D/C date and plan/Code Status   DVT prophylaxis: Heparin Code Status: Full Code.  Family Communication: None at bedside Disposition Plan: home in the AM -- change to PO lasix in AM   Medical Consultants:    None.     Subjective:   Thinks she is getting close to her baseline with breathing  Objective:    Vitals:   05/21/19 0430 05/21/19 0430 05/21/19 0844 05/21/19 1115  BP:  (!) 131/58 (!) 145/60 (!) 126/58  Pulse:  68 73   Resp:  18 17   Temp: 98.5 F (36.9 C) 98.5 F (36.9 C) 98.5 F (36.9 C)   TempSrc: Oral Oral Oral   SpO2:  98% 97%   Weight:      Height:        Intake/Output Summary (Last 24 hours) at 05/21/2019 1252 Last data filed at 05/21/2019 1100 Gross per 24 hour  Intake 1270 ml  Output 1200 ml  Net 70 ml   Filed Weights   05/19/19 0355 05/20/19 0058 05/21/19 0400  Weight: 72.1 kg 70.8 kg 70.7 kg  Exam: In bed, appears anxious rrr No increased work of breathing, on 4L Min LE edema  Data Reviewed:   I have personally reviewed following labs and imaging studies:  Labs: Labs show the following:   Basic Metabolic Panel: Recent Labs  Lab 05/18/19 0926 05/18/19 0926 05/19/19 0529 05/19/19 0529 05/20/19 0429 05/21/19 0622  NA 139  --  141  --  141 138  K 4.0   < > 4.1   < > 4.3 4.2  CL 102  --  104  --  102 99  CO2 27  --  29  --  29 30  GLUCOSE 199*  --  104*  --  57* 82  BUN 48*  --  48*  --  53* 55*  CREATININE 1.56*  --  1.74*  --   2.06* 1.89*  CALCIUM 9.3  --  9.2  --  9.0 9.1   < > = values in this interval not displayed.   GFR Estimated Creatinine Clearance: 25.7 mL/min (A) (by C-G formula based on SCr of 1.89 mg/dL (H)). Liver Function Tests: No results for input(s): AST, ALT, ALKPHOS, BILITOT, PROT, ALBUMIN in the last 168 hours. No results for input(s): LIPASE, AMYLASE in the last 168 hours. No results for input(s): AMMONIA in the last 168 hours. Coagulation profile No results for input(s): INR, PROTIME in the last 168 hours.  CBC: Recent Labs  Lab 05/18/19 0926 05/21/19 0622  WBC 4.8 4.5  NEUTROABS 3.9  --   HGB 9.6* 8.4*  HCT 31.4* 27.0*  MCV 90.8 90.3  PLT 266 212   Cardiac Enzymes: No results for input(s): CKTOTAL, CKMB, CKMBINDEX, TROPONINI in the last 168 hours. BNP (last 3 results) No results for input(s): PROBNP in the last 8760 hours. CBG: Recent Labs  Lab 05/20/19 1150 05/20/19 1647 05/20/19 2147 05/21/19 0620 05/21/19 1127  GLUCAP 89 92 89 77 134*   D-Dimer: No results for input(s): DDIMER in the last 72 hours. Hgb A1c: No results for input(s): HGBA1C in the last 72 hours. Lipid Profile: No results for input(s): CHOL, HDL, LDLCALC, TRIG, CHOLHDL, LDLDIRECT in the last 72 hours. Thyroid function studies: No results for input(s): TSH, T4TOTAL, T3FREE, THYROIDAB in the last 72 hours.  Invalid input(s): FREET3 Anemia work up: No results for input(s): VITAMINB12, FOLATE, FERRITIN, TIBC, IRON, RETICCTPCT in the last 72 hours. Sepsis Labs: Recent Labs  Lab 05/18/19 0926 05/21/19 0622  WBC 4.8 4.5    Microbiology Recent Results (from the past 240 hour(s))  SARS CORONAVIRUS 2 (TAT 6-24 HRS) Nasopharyngeal Nasopharyngeal Swab     Status: None   Collection Time: 05/18/19  1:12 PM   Specimen: Nasopharyngeal Swab  Result Value Ref Range Status   SARS Coronavirus 2 NEGATIVE NEGATIVE Final    Comment: (NOTE) SARS-CoV-2 target nucleic acids are NOT DETECTED. The SARS-CoV-2  RNA is generally detectable in upper and lower respiratory specimens during the acute phase of infection. Negative results do not preclude SARS-CoV-2 infection, do not rule out co-infections with other pathogens, and should not be used as the sole basis for treatment or other patient management decisions. Negative results must be combined with clinical observations, patient history, and epidemiological information. The expected result is Negative. Fact Sheet for Patients: SugarRoll.be Fact Sheet for Healthcare Providers: https://www.woods-mathews.com/ This test is not yet approved or cleared by the [place] FDA and  has been authorized for detection and/or diagnosis of SARS-CoV-2 by FDA under an Emergency Use Authorization (EUA). This EUA  will remain  in effect (meaning this test can be used) for the duration of the COVID-19 declaration under Section 56 4(b)(1) of the Act, 21 U.S.C. section 360bbb-3(b)(1), unless the authorization is terminated or revoked sooner. Performed at [facility], [place] [address], [place], [place] [address]     Procedures and diagnostic studies:  No results found.  Medications:   . allopurinol  300 mg Oral Daily  . aspirin EC  81 mg Oral Daily  . atorvastatin  80 mg Oral q1800  . carvedilol  25 mg Oral BID WC  . DULoxetine  30 mg Oral Daily  . furosemide  60 mg Intravenous BID  . gabapentin  100 mg Oral QHS  . heparin  5,000 Units Subcutaneous Q8H  . hydrALAZINE  10 mg Oral Q6H  . insulin aspart  0-9 Units Subcutaneous TID WC  . insulin glargine  10 Units Subcutaneous QHS  . isosorbide mononitrate  30 mg Oral Daily  . metoCLOPramide  5 mg Oral TID  . multivitamin with minerals  1 tablet Oral Daily  . pantoprazole  20 mg Oral Daily  . sodium chloride flush  3 mL Intravenous Q12H   Continuous Infusions: . sodium chloride       LOS: 2 days   Geradine Girt  [facility]   How to  contact the [facility] Attending or Consulting provider [place] or covering provider during after hours [place], for this patient?  1. Check the care team in [facility] and look for a) attending/consulting [facility] provider listed and b) the [facility] team listed 2. Log into [email or web address] and use [place]'s universal password to access. If you do not have the password, please contact the hospital operator. 3. Locate the [facility] provider you are looking for under [facility] and page to a number that you can be directly reached. 4. If you still have difficulty reaching the provider, please page the [facility] (Director on Call) for the Hospitalists listed on amion for assistance.  05/21/2019, 12:52 PM

## 2019-05-21 NOTE — Progress Notes (Signed)
Physical Therapy Treatment Patient Details Name: Kara Hanson MRN: [id number] DOB: Mar 11, 1956 Today's Date: 05/21/2019    History of Present Illness Pt is a 64 y/o female admitted for shortness of breath after her power went out and did not receive supplemental oxygen. Pt also with signs of CHF exacerbation. PMH includes HTN, CHF, DM, CKD, COVID, and respiratory failure on 4L of O2.     PT Comments    Pt making good progress. She was able to ambulate 200' and perform steps with supervision to min guard on 4 LPM o2 with stable O2 sats.  Needed min cueing for safety.  Recommended and educated on use of RW at home for safety.    Follow Up Recommendations  Home health PT;Supervision for mobility/OOB     Equipment Recommendations  None recommended by PT    Recommendations for Other Services       Precautions / Restrictions Precautions Precautions: Fall    Mobility  Bed Mobility Overal bed mobility: Needs Assistance       Supine to sit: Supervision     General bed mobility comments: Supervision for safety.   Transfers Overall transfer level: Needs assistance Equipment used: Rolling walker (2 wheeled) Transfers: Sit to/from Stand Sit to Stand: Supervision            Ambulation/Gait Ambulation/Gait assistance: Min guard Gait Distance (Feet): 200 Feet Assistive device: None Gait Pattern/deviations: Step-through pattern;Decreased stride length     General Gait Details: slow and cautious gait with occasional use of handrail.  Pt drifting to R/L at times but no LOB - discussed use of RW at home for safety.  Oxygen sats ranging from 92-98 % on 4 LPM with DOE at 2/4   Stairs Stairs: Yes Stairs assistance: Min guard Stair Management: One rail Right Number of Stairs: 2 General stair comments: cues for use of rail; min guard for safety   Wheelchair Mobility    Modified Rankin (Stroke Patients Only)       Balance Overall balance assessment: Needs  assistance Sitting-balance support: No upper extremity supported;Feet supported Sitting balance-Leahy Scale: Good     Standing balance support: No upper extremity supported;During functional activity Standing balance-Leahy Scale: Fair                              Cognition Arousal/Alertness: Awake/alert Behavior During Therapy: WFL for tasks assessed/performed Overall Cognitive Status: Within Functional Limits for tasks assessed                                        Exercises      General Comments General comments (skin integrity, edema, etc.): Pt on 4 LPM O2 with sats 92-98%      Pertinent Vitals/Pain Pain Assessment: No/denies pain    Home Living                      Prior Function            PT Goals (current goals can now be found in the care plan section) Progress towards PT goals: Progressing toward goals    Frequency    Min 3X/week      PT Plan Current plan remains appropriate    Co-evaluation              AM-PAC PT "6 Clicks" Mobility  Outcome Measure  Help needed turning from your back to your side while in a flat bed without using bedrails?: None Help needed moving from lying on your back to sitting on the side of a flat bed without using bedrails?: None Help needed moving to and from a bed to a chair (including a wheelchair)?: None Help needed standing up from a chair using your arms (e.g., wheelchair or bedside chair)?: None Help needed to walk in hospital room?: None Help needed climbing 3-5 steps with a railing? : A Little 6 Click Score: 23    End of Session Equipment Utilized During Treatment: Gait belt;Oxygen Activity Tolerance: Patient tolerated treatment well Patient left: with call bell/phone within reach;in chair Nurse Communication: Mobility status PT Visit Diagnosis: Unsteadiness on feet (R26.81);Muscle weakness (generalized) (M62.81)     Time: 0160-1093 PT Time Calculation (min)  (ACUTE ONLY): 24 min  Charges:  $Gait Training: 23-37 mins                     Maggie Font, PT Acute Rehab Services Pager [phone] [facility] [phone]    [name] 05/21/2019, 4:26 PM

## 2019-05-22 DIAGNOSIS — E1159 Type 2 diabetes mellitus with other circulatory complications: Secondary | ICD-10-CM

## 2019-05-22 LAB — BASIC METABOLIC PANEL
Anion gap: 7 (ref 5–15)
BUN: 55 mg/dL — ABNORMAL HIGH (ref 8–23)
CO2: 32 mmol/L (ref 22–32)
Calcium: 9.2 mg/dL (ref 8.9–10.3)
Chloride: 100 mmol/L (ref 98–111)
Creatinine, Ser: 1.94 mg/dL — ABNORMAL HIGH (ref 0.44–1.00)
GFR calc Af Amer: 31 mL/min — ABNORMAL LOW (ref >60)
GFR calc non Af Amer: 27 mL/min — ABNORMAL LOW (ref >60)
Glucose, Bld: 65 mg/dL — ABNORMAL LOW (ref 70–99)
Potassium: 4.1 mmol/L (ref 3.5–5.1)
Sodium: 139 mmol/L (ref 135–145)

## 2019-05-22 LAB — GLUCOSE, CAPILLARY
Glucose-Capillary: 69 mg/dL — ABNORMAL LOW (ref 70–99)
Glucose-Capillary: 81 mg/dL (ref 70–99)
Glucose-Capillary: 83 mg/dL (ref 70–99)
Glucose-Capillary: 134 mg/dL — ABNORMAL HIGH (ref 70–99)
Glucose-Capillary: 102 mg/dL — ABNORMAL HIGH (ref 70–99)

## 2019-05-22 MED ORDER — ISOSORBIDE MONONITRATE ER 30 MG PO TB24: 30.0000 mg | ORAL_TABLET | Freq: Every day | ORAL | 0 refills | Status: DC

## 2019-05-22 MED ORDER — BASAGLAR KWIKPEN 100 UNIT/ML ~~LOC~~ SOPN: 15.0000 [IU] | PEN_INJECTOR | Freq: Every day | SUBCUTANEOUS | 3 refills | Status: DC

## 2019-05-22 MED ORDER — GABAPENTIN 100 MG PO CAPS: 100.0000 mg | ORAL_CAPSULE | Freq: Every day | ORAL | Status: DC

## 2019-05-22 MED ORDER — POLYETHYLENE GLYCOL 3350 17 G PO PACK: 17.0000 g | PACK | Freq: Every day | ORAL | Status: DC | PRN

## 2019-05-22 NOTE — Discharge Summary (Signed)
Physician Discharge Summary  Kara Hanson OIB:[id number] DOB: September 20, 1955 DOA: 05/18/2019  PCP: [name], MD  Admit date: 05/18/2019 Discharge date: 05/22/2019  Admitted From: Home Discharge disposition: Home   Recommendations for Outpatient Follow-Up:   1. Home health 2. A question whether not patient is actually taking her medications at home 3. BMP 1 week   Discharge Diagnosis:   Active Problems:   Type 2 diabetes mellitus with diabetic neuropathy, unspecified (HCC)   CKD (chronic kidney disease), stage III   Acute CHF (congestive heart failure) (HCC)   CHF (congestive heart failure) (HCC)   Acute systolic CHF (congestive heart failure) ([place])    Discharge Condition: Improved.  Diet recommendation: Low sodium, heart healthy.  Carbohydrate-modified..  Wound care: None.  Code status: Full.   History of Present Illness:   Kara Hanson is a 64 y.o. female with medical history significant of IDDM, hypertension, congestive heart failure (EF40-45% from echo of1/2021, COVID-19 related pneumonia in 07/2018, presented with increasing short of breath.  Patient was hospitalized last month for CHF decompensation, upon discharge, her Lasix dosage increased to 80 mg twice daily.  Over the last 2 weeks, she has had increasing short of breath on exertion especially with getting up to walk to the bathroom despite on 4 L supplemental oxygen which was her routine. Earlier this morning her power went out and she did not receive her supplemental oxygen for some time, called out to EMS for evaluation and was found to be hypoxic 84% on room air but improved with application of supplemental oxygen via nasal cannula. She reports that while on route to the hospital with EMS she developed sharp left-sided chest pains that worsened with deep inspiration. She denies any fevers, cough, nausea, vomiting, abdominal pain.  ED Course: Blood pressure significantly increased, sublingual  nitroglycerin was given with some improvement in her blood pressure and complete resolution of her pain. She denies any fevers, cough, nausea, vomiting, abdominal pain. Reports that she has been compliant with her home Lasix, salt and fluid restriction. She had a recent follow-up visit with her PCP on 05/08/2019 where she noted that she was urinating less and less and had gained about 5 pounds since her admission for CHF exacerbation in January.    Hospital Course by Problem:   Acute on chronic CHF:  Systolic -Patient had an echocardiogram in January 2021 with an EF of 40% -had signs of fluid overload on clinical exam  -Resume home oral Lasix with BMP in 1 week -does not have a scale at home but will have daughter get one so she can weigh daily  Chronic respiratory failure -Patient is on 4 L O2 at home  -Resume home O2  uncontrolled HTN Resolved with resumption of home meds -Question compliance at home  Atypical chest pain Most likely related to CHF decompensation, 2 sets troponin negative, EKG no acute ST changes. Treat CHF -no further episodes of chest pain  CKD stage III B Monitor levels while patient is being diuresed  Uncontrolled type 2 diabetes With proteinuria and hyperglycemia -Home regimen was resumed and now patient has hypoglycemia -Patient states she is compliant at home -Reduce Lantus dose and encourage compliance at home to diet and medications  DM neuropathy -renal dosed gabapentin -Change to nightly (tremors improved this AM on lower dose)  Gout Continue allopurinol  obesity Body mass index is 31.47 kg/m.  Tremor -Patient states she has outpatient follow-up with neurology next week   This is  patient's seventh hospitalization in the last year.    Medical Consultants:      Discharge Exam:   Vitals:   05/22/19 0504 05/22/19 0911  BP: (!) 131/59 (!) 134/59  Pulse: 65 68  Resp:    Temp: 98.4 F (36.9 C)   SpO2: 99%    Vitals:    05/21/19 2009 05/22/19 0015 05/22/19 0504 05/22/19 0911  BP: (!) 135/59 (!) 134/59 (!) 131/59 (!) 134/59  Pulse: 64  65 68  Resp: 20     Temp: 97.8 F (36.6 C)  98.4 F (36.9 C)   TempSrc: Oral  Oral   SpO2: 99%  99%   Weight:   70.3 kg   Height:        General exam: Appears calm and comfortable.   The results of significant diagnostics from this hospitalization (including imaging, microbiology, ancillary and laboratory) are listed below for reference.     Procedures and Diagnostic Studies:   DG Chest Portable 1 View  Result Date: 05/18/2019 CLINICAL DATA:  Shortness of breath. EXAM: PORTABLE CHEST 1 VIEW COMPARISON:  04/07/2019 FINDINGS: Heart size is normal. Small bilateral pleural effusions and mild interstitial edema. Airspace density within the left lower lobe is noted which may represent atelectasis or pneumonia. IMPRESSION: 1. Suspect mild CHF. 2. Left lower lobe atelectasis versus pneumonia. Electronically Signed   By: Kerby Moors M.D.   On: 05/18/2019 10:04     Labs:   Basic Metabolic Panel: Recent Labs  Lab 05/18/19 0926 05/18/19 0926 05/19/19 0529 05/19/19 0529 05/20/19 0429 05/20/19 0429 05/21/19 0622 05/22/19 0429  NA 139  --  141  --  141  --  138 139  K 4.0   < > 4.1   < > 4.3   < > 4.2 4.1  CL 102  --  104  --  102  --  99 100  CO2 27  --  29  --  29  --  30 32  GLUCOSE 199*  --  104*  --  57*  --  82 65*  BUN 48*  --  48*  --  53*  --  55* 55*  CREATININE 1.56*  --  1.74*  --  2.06*  --  1.89* 1.94*  CALCIUM 9.3  --  9.2  --  9.0  --  9.1 9.2   < > = values in this interval not displayed.   GFR Estimated Creatinine Clearance: 25 mL/min (A) (by C-G formula based on SCr of 1.94 mg/dL (H)). Liver Function Tests: No results for input(s): AST, ALT, ALKPHOS, BILITOT, PROT, ALBUMIN in the last 168 hours. No results for input(s): LIPASE, AMYLASE in the last 168 hours. No results for input(s): AMMONIA in the last 168 hours. Coagulation profile No  results for input(s): INR, PROTIME in the last 168 hours.  CBC: Recent Labs  Lab 05/18/19 0926 05/21/19 0622  WBC 4.8 4.5  NEUTROABS 3.9  --   HGB 9.6* 8.4*  HCT 31.4* 27.0*  MCV 90.8 90.3  PLT 266 212   Cardiac Enzymes: No results for input(s): CKTOTAL, CKMB, CKMBINDEX, TROPONINI in the last 168 hours. BNP: Invalid input(s): POCBNP CBG: Recent Labs  Lab 05/21/19 1657 05/21/19 2124 05/22/19 0440 05/22/19 0502 05/22/19 0618  GLUCAP 109* 131* 69* 81 83   D-Dimer No results for input(s): DDIMER in the last 72 hours. Hgb A1c No results for input(s): HGBA1C in the last 72 hours. Lipid Profile No results for input(s): CHOL, HDL, LDLCALC,  TRIG, CHOLHDL, LDLDIRECT in the last 72 hours. Thyroid function studies No results for input(s): TSH, T4TOTAL, T3FREE, THYROIDAB in the last 72 hours.  Invalid input(s): FREET3 Anemia work up No results for input(s): VITAMINB12, FOLATE, FERRITIN, TIBC, IRON, RETICCTPCT in the last 72 hours. Microbiology Recent Results (from the past 240 hour(s))  SARS CORONAVIRUS 2 (TAT 6-24 HRS) Nasopharyngeal Nasopharyngeal Swab     Status: None   Collection Time: 05/18/19  1:12 PM   Specimen: Nasopharyngeal Swab  Result Value Ref Range Status   SARS Coronavirus 2 NEGATIVE NEGATIVE Final    Comment: (NOTE) SARS-CoV-2 target nucleic acids are NOT DETECTED. The SARS-CoV-2 RNA is generally detectable in upper and lower respiratory specimens during the acute phase of infection. Negative results do not preclude SARS-CoV-2 infection, do not rule out co-infections with other pathogens, and should not be used as the sole basis for treatment or other patient management decisions. Negative results must be combined with clinical observations, patient history, and epidemiological information. The expected result is Negative. Fact Sheet for Patients: SugarRoll.be Fact Sheet for Healthcare  Providers: https://www.woods-mathews.com/ This test is not yet approved or cleared by the [place] FDA and  has been authorized for detection and/or diagnosis of SARS-CoV-2 by FDA under an Emergency Use Authorization (EUA). This EUA will remain  in effect (meaning this test can be used) for the duration of the COVID-19 declaration under Section 56 4(b)(1) of the Act, 21 U.S.C. section 360bbb-3(b)(1), unless the authorization is terminated or revoked sooner. Performed at [facility], [place] [address] [place], [place] [address]      Discharge Instructions:   Discharge Instructions    (HEART FAILURE PATIENTS) Call MD:  Anytime you have any of the following symptoms: 1) 3 pound weight gain in 24 hours or 5 pounds in 1 week 2) shortness of breath, with or without a dry hacking cough 3) swelling in the hands, feet or stomach 4) if you have to sleep on extra pillows at night in order to breathe.   Complete by: As directed    Diet - low sodium heart healthy   Complete by: As directed    Discharge instructions   Complete by: As directed    Bring log of blood pressure, blood sugars and weights to PCP BMP 1 week   Heart Failure patients record your daily weight using the same scale at the same time of day   Complete by: As directed    Increase activity slowly   Complete by: As directed      Allergies as of 05/22/2019      Reactions   Garlic Shortness Of Breath, Itching, Swelling   Raw*  Hand itching and swelling   Latex Itching   Morphine And Related Itching, Other (See Comments)   Headache   Other Itching   Reaction to newspaper ink -itching and headache      Medication List    TAKE these medications   Accu-Chek FastClix Lancets Misc USE AS DIRECTED TO TEST BLOOD SUGAR THREE TIMES DAILY   Accu-Chek Guide test strip Generic drug: glucose blood USE AS DIRECTED TO TEST BLOOD SUGAR THREE TIMES DAILY   Accu-Chek Guide w/Device Kit 1 each by Does not apply  route 3 (three) times daily.   allopurinol 300 MG tablet Commonly known as: ZYLOPRIM Take 300 mg by mouth daily.   aspirin EC 81 MG tablet Take 81 mg by mouth daily.   atorvastatin 80 MG tablet Commonly known as: LIPITOR  Take 80 mg by mouth daily at 6 PM.   Basaglar KwikPen 100 UNIT/ML Sopn Inject 0.15 mLs (15 Units total) into the skin at bedtime. What changed: how much to take   carvedilol 25 MG tablet Commonly known as: COREG Take 1 tablet (25 mg total) by mouth 2 (two) times daily with a meal.   DULoxetine 30 MG capsule Commonly known as: CYMBALTA Take 1 capsule (30 mg total) by mouth daily.   Easy Comfort Pen Needles 31G X 5 MM Misc Generic drug: Insulin Pen Needle USE FOUR TIMES PER DAY FOR INSULIN ADMINISTRATION What changed: See the new instructions.   furosemide 80 MG tablet Commonly known as: LASIX Take 80 mg by mouth 2 (two) times daily.   gabapentin 100 MG capsule Commonly known as: NEURONTIN Take 1 capsule (100 mg total) by mouth at bedtime. What changed: when to take this   insulin aspart 100 UNIT/ML injection Commonly known as: novoLOG 0 to 12 units subcutaneously 3 times daily before meals as per sliding scale   isosorbide mononitrate 30 MG 24 hr tablet Commonly known as: IMDUR Take 1 tablet (30 mg total) by mouth daily. Start taking on: May 23, 2019   Lancet Device Misc Use as instructed 3 times daily   lansoprazole 15 MG capsule Commonly known as: PREVACID Take 15 mg by mouth daily.   metoCLOPramide 5 MG tablet Commonly known as: Reglan Take 1 tablet (5 mg total) by mouth 3 (three) times daily.   Misc. Devices Misc Portable oxygen concentrator.  Diagnosis-chronic respiratory failure.   Misc. Devices Misc Rollaor with seat. Dx: Congestive Heart Failure   multivitamin with minerals tablet Take 1 tablet by mouth daily.   polyethylene glycol 17 g packet Commonly known as: MIRALAX / GLYCOLAX Take 17 g by mouth daily as needed for  moderate constipation.   sodium chloride 0.65 % Soln nasal spray Commonly known as: OCEAN Place 1 spray into both nostrils as needed for congestion.   Super B Complex/C Caps Take 1 capsule by mouth daily.      Follow-up Information    [name], MD Follow up in 1 week(s).   Specialty: Family Medicine Contact information: [place] [address] [phone]        Jerline Pain, MD. Schedule an appointment as soon as possible for a visit.   Specialty: Cardiology Contact information: [address] [place] [address] [phone]            Time coordinating discharge: 35 min  Signed:  Geradine Girt DO  [facility] 05/22/2019, 9:51 AM

## 2019-05-22 NOTE — Plan of Care (Signed)
?  Problem: Education: ?Goal: Ability to demonstrate management of disease process will improve ?Outcome: Progressing ?  ?

## 2019-05-22 NOTE — Progress Notes (Signed)
.  Hypoglycemic Event  CBG: 69 at 0440  Treatment: 4 oz juice/soda  Symptoms: None  Follow-up CBG: Time: 0500 CBG Result:81  Possible Reasons for Event: Unknown , but received 10 unit of lantus last night 10pm.  Comments/MD notified: MD Merilyn Baba  Priyansh Pry

## 2019-05-22 NOTE — Progress Notes (Signed)
ReDS Clip Diuretic Study Pt study # [id number]  Your patient is in the Blinded arm of the ReDS Clip Diuretic study.  Your patient has had a ReDS reading and the reading has been transmitted to the cloud.     Thank You     Bonnita Nasuti Pharm.D. CPP, BCPS Clinical Pharmacist [phone] 05/20/2019        8:31 AM

## 2019-05-22 NOTE — TOC Initial Note (Signed)
Transition of Care [facility]) - Initial/Assessment Note    Patient Details  Name: Kara Hanson MRN: [id number] Date of Birth: 1956/02/24  Transition of [facility]) CM/SW Contact:    Curlene Labrum, RN Phone Number: 05/22/2019, 12:50 PM  Clinical Narrative:             Patient presented to [facility] 3 days ago with Acute CHF.  Patient currently lives with her adult daughter Kara Hanson [id number]) at the current address in [place], [place] as listed in the demographics.  Patient plans to go home with her daughter today upon discharge.  Patient presented with Medicare Choice list of home health services and patient chose to have home health PT services set up with [facility].  [place] called and spoke with Tiffany to have [facility] PT set up in the next 24-48 hours.  The patient plans to be discharged home today by car with her daughter, Kara Hanson.  Patient verbalized that her home O2 portable tank was not working correctly.  Called Caryl Pina with [employer] and asked that they [employer]) deliver a new portable tank O2 to the hospital today for discharge home and to have them also check the O2 at the home, both the portable and the existing concentrator for home use.  Patient verbalizes no need for DME equipment nor transportation issues.  No further case management needs identified in regards to PCP nor medication assistance.  Patient to be discharged home once the portable tank is delivered and the daughter arrives.        Expected Discharge Plan: [place] Barriers to Discharge: No Barriers Identified   Patient Goals and CMS Choice Patient states their goals for this hospitalization and ongoing recovery are:: "I'm ready to go home, where I'm staying with my daughter." CMS Medicare.gov Compare Post Acute Care list provided to:: Patient Choice offered to / list presented to : Patient  Expected Discharge Plan and Services Expected Discharge Plan: [place] In-house Referral: NA Discharge Planning Services: CM Consult Post Acute Care Choice: Home Health(Existing [employer] services for O2) Living arrangements for the past 2 months: Single Family Home Expected Discharge Date: 05/22/19               DME Arranged: Oxygen(Talked with [employer] and left message to have portable O2 tank delivered to the hospital for discharge and Home O2 rechecked per patient's request) DME Agency: [employer](Left message with Duwaine Maxin) Date DME Agency Contacted: 05/22/19 Time DME Agency Contacted: [phone] Representative spoke with at DME Agency: Duwaine Maxin [place] Arranged: PT [place] Agency: [facility] (formerly [employer]) Date [place]: 05/22/19 Time [place]: [phone] Representative spoke with at [place]: [place], with [facility] for PT  Prior Living Arrangements/Services Living arrangements for the past 2 months: [place] with:: Adult Children Patient language and need for interpreter reviewed:: Yes        Need for Family Participation in Patient Care: Yes (Comment) Care giver support system in place?: Yes (comment) Current home services: DME, Home PT, Other (comment)(Home O2 through [place]) Criminal Activity/Legal Involvement Pertinent to Current Situation/Hospitalization: No - Comment as needed  Activities of Daily Living Home Assistive Devices/Equipment: CBG Meter, Dentures (specify type), Oxygen ADL Screening (condition at time of admission) Patient's cognitive ability adequate to safely complete daily activities?: Yes Is the patient deaf or have difficulty hearing?: No Does the patient have difficulty seeing, even when wearing glasses/contacts?:  No Does the patient have difficulty concentrating, remembering, or making decisions?: No Patient able to express need for assistance with ADLs?: No Does the patient have difficulty dressing or bathing?: No Independently performs ADLs?: Yes  (appropriate for developmental age) Weakness of Legs: Both Weakness of Arms/Hands: None  Permission Sought/Granted Permission sought to share information with : Case Manager Permission granted to share information with : Yes, Verbal Permission Granted     Permission granted to share info w AGENCY: [facility] and [facility] [employer] granted to share info w Contact Information: [place] [phone]  Emotional Assessment Appearance:: Appears older than stated age, Well-Groomed Attitude/Demeanor/Rapport: Engaged Affect (typically observed): Accepting, Appropriate Orientation: : Oriented to Self, Oriented to Place, Oriented to  Time, Oriented to Situation Alcohol / Substance Use: Not Applicable Psych Involvement: No (comment)  Admission diagnosis:  CHF (congestive heart failure) (HCC) [I50.9] Acute on chronic combined systolic and diastolic congestive heart failure (HCC) [H41.74] Acute systolic CHF (congestive heart failure) ([place]) [I50.21] Patient Active Problem List   Diagnosis Date Noted  . Acute systolic CHF (congestive heart failure) ([place]) 05/19/2019  . Acute exacerbation of CHF (congestive heart failure) ([place]) 04/18/2019  . Elevated troponin 04/18/2019  . Acute respiratory failure with hypoxia ([place]) 02/09/2019  . NSTEMI (non-ST elevated myocardial infarction) ([place]) 02/09/2019  . S/P thoracentesis   . Atelectasis   . Chronic respiratory failure ([place])   . Acute renal failure superimposed on stage 3 chronic kidney disease ([place])   . Epigastric pain   . CHF (congestive heart failure) ([place]) 01/23/2019  . CAP (community acquired pneumonia) 10/31/2018  . Acute CHF (congestive heart failure) ([place]) 09/24/2018  . SIRS (systemic inflammatory response syndrome) ([place]) 07/25/2018  . COVID-19 virus infection 07/20/2018  . Chronic diastolic CHF (congestive heart failure) ([place]) 07/20/2018  . Pneumonia due to COVID-19 virus 07/20/2018  . Vitamin D deficiency 04/02/2018  . Spinal  stenosis 03/29/2018  . Acute on chronic respiratory failure with hypoxia ([place]) 03/20/2018  . Controlled type 2 diabetes with neuropathy ([place]) 03/20/2018  . CKD (chronic kidney disease), stage III 03/20/2018  . Acute on chronic combined systolic and diastolic CHF (congestive heart failure) ([place]) 11/27/2017  . AKI (acute kidney injury) ([place]) 11/27/2017  . Acute respiratory distress 11/27/2017  . Bulging lumbar disc 11/14/2017  . Gout 11/14/2017  . Hyperlipidemia LDL goal <70 09/30/2016  . Chest pain 06/17/2016  . Stress-induced cardiomyopathy 05/19/2016  . Type 2 diabetes mellitus with diabetic neuropathy, unspecified ([place]) 05/06/2016  . Vertigo 05/06/2016  . Controlled type 2 diabetes mellitus with hyperglycemia ([place]) 04/23/2016  . Hypertension 04/23/2016  . Normochromic normocytic anemia 04/23/2016  . Syncope 04/23/2016   PCP:  [name], MD Pharmacy:   [place], [place] - [address] [place] [address] Phone: [phone] Fax: [phone]     Social Determinants of Health (SDOH) Interventions    Readmission Risk Interventions Readmission Risk Prevention Plan 05/22/2019 02/11/2019 02/06/2019  Transportation Screening Complete Complete Complete  PCP or Specialist Appt within 3-5 Days - - -  Not Complete comments - - -  [place] or [place] for [place] Planning/Counseling - - -  Palliative Care Screening - - -  Medication Review (RN Care Manager) Complete Complete Complete  PCP or Specialist appointment within 3-5 days of discharge Complete Complete Complete  HRI or Home Care Consult Complete Complete Patient refused  SW Recovery Care/Counseling  Consult Complete Complete Complete  Palliative Care Screening Not Applicable Not Applicable Not Applicable  Skilled Nursing Facility Not Applicable Not Applicable Not Applicable  Some recent data might be hidden

## 2019-05-22 NOTE — Progress Notes (Signed)
Pt has been discharged home, in the care of her daughter.  Pt IV has been removed and discharge instructions has been reviewed with the pt and family member.  Pt was on 4LNC with personal oxygen tank.  Pt had no further questions during this time.

## 2019-05-23 ENCOUNTER — Telehealth: Payer: Self-pay

## 2019-05-23 ENCOUNTER — Ambulatory Visit: Payer: Medicare Other | Admitting: Neurology

## 2019-05-23 NOTE — Telephone Encounter (Signed)
Transition Care Management Follow-up Telephone Call  Date of discharge and from where: 05/22/2019, [facility]   How have you been since you were released from the hospital? She said that she is feeling better and glad to be home.   Any questions or concerns? He concern was regarding back up oxygen,  She said that they gave her a new POC when she left the hospital but they have not delivered a back up tank as promised.  Informed her that this CM would check with [employer] about the back up.   Also reminded her that there is a letter for [employer] at [facility] that [name] can pick up for her noting that she needs to have power for her O2.  She said she will need to check with [name] to see if she has picked it up.  Also discussed contacting the local fire department regarding her need for O2 in the evert of power outage and she stated that they had talked about that.   Call placed to [place], spoke to [place] who said that she would send the order for back up O2 tank delivery- with delivery planned for tonight or tomorrow morning - this information was shared with the patient.   Items Reviewed:  Did the pt receive and understand the discharge instructions provided? Yes, she has the instructions and her daughter reviews them as well. No questions at this time   Medications obtained and verified? She said that [name] has the medication list. Instructed her to review the list again and call this CM back with any questions.  There is a new medication  - imdur which the patient stated that she has.  There are changes to basaglar and gabapentin.  She said that she has been taking basaglar 35 units at bedtime. Informed her that the AVS indicates to take 15 units at bedtime. She was able to state the correct order for gabapentin. She did not have any questions about the medications. Has home delivery from [employer].   Any new allergies since your discharge? None reported   Do you have support at  home? Lives with her daughter, [name].  They are currently in a new home with no stairs  Other (ie: DME, Home Health, etc) home PT ordered through [facility] [place] from [place]  - uses it at 4L continuously.   Needs to get a scale.  Does not have a BP monitor  Still needs a glucometer. Informed her that [facility] has spoken to [place] about this.  His pharmacy is not able to bill medicare part B so the prescription will need to be transferred to another pharmacy ( ie [facility])  She said she will need to speak to [place] before deciding which pharmacy to use. This CM stressed the importance of obtaining the glucometer to monitor her blood sugars.  She also has the novolog ordered per sliding scale and the glucometer is needed to determine how much insulin  Should be administered.   Has rollator.   Functional Questionnaire: (I = Independent and D = Dependent) ADL's: independent.   Daughter assists as needed   Follow up appointments reviewed:    PCP Hospital f/u appt confirmed? Has appointment with [facility] walk in provider 05/20/2019.   [facility] f/u appt confirmed? Neurology appointment scheduled for today was cancelled due to weather. She will need to re-schedule. Also needs to schedule appointment with cardiology.  Are transportation arrangements needed? no, she has been taking  a cab to appointments. The options of [facility] and medicaid transportation have been discussed with her in the past.   If their condition worsens, is the pt aware to call  their PCP or go to the ED? yes  Was the patient provided with contact information for the PCP's office or ED?  she has the phone number for the clinic  Was the pt encouraged to call back with questions or concerns?yes

## 2019-05-23 NOTE — Telephone Encounter (Signed)
From the discharge call.  She has an appointment with the walk in provider 05/30/2019.    He concern was regarding back up oxygen,  She said that they gave her a new POC when she left the hospital but they have not delivered a back up tank as promised.  Informed her that this CM would check with [employer] about the back up.   Also reminded her that there is a letter for [employer] at [facility] that [name] can pick up for her noting that she needs to have power for her O2.  She said she will need to check with [name] to see if she has picked it up.  Also discussed contacting the local fire department regarding her need for O2 in the evert of power outage and she stated that they had talked about that.   Call placed to [place], spoke to [place] who said that she would send the order for back up O2 tank delivery- with delivery planned for tonight or tomorrow morning - this information was shared with the patient.    .There is a new medication  - imdur which the patient stated that she has.  There are changes to basaglar and gabapentin.  She said that she has been taking basaglar 35 units at bedtime. Informed her that the AVS indicates to take 15 units at bedtime. She was able to state the correct order for gabapentin.    Does not have a BP monitor    Still needs a glucometer. Informed her that [facility] has spoken to [place] about this.  His pharmacy is not able to bill medicare part B so the prescription will need to be transferred to another pharmacy ( ie [facility])  She said she will need to speak to [place] before deciding which pharmacy to use. This CM stressed the importance of obtaining the glucometer to monitor her blood sugars.  She also has the novolog ordered per sliding scale and the glucometer is needed to determine how much insulin  Should be administered.

## 2019-05-23 NOTE — Telephone Encounter (Signed)
I reached out to the pt and left a vm advising are office will be closed on 05/23/2019 due to inclement weather.  Pt was advised to call our office on 05/27/2019 so we could reschedule this appointment.

## 2019-05-24 ENCOUNTER — Telehealth: Payer: Self-pay | Admitting: Family Medicine

## 2019-05-24 NOTE — Telephone Encounter (Signed)
Verbal order were given.

## 2019-05-24 NOTE — Telephone Encounter (Signed)
Noted  

## 2019-05-24 NOTE — Telephone Encounter (Signed)
[facility] calling to get verbal orders for start of care.  Leon[place], [phone]

## 2019-05-25 DIAGNOSIS — I130 Hypertensive heart and chronic kidney disease with heart failure and stage 1 through stage 4 chronic kidney disease, or unspecified chronic kidney disease: Secondary | ICD-10-CM | POA: Diagnosis not present

## 2019-05-25 DIAGNOSIS — Z794 Long term (current) use of insulin: Secondary | ICD-10-CM | POA: Diagnosis not present

## 2019-05-25 DIAGNOSIS — E785 Hyperlipidemia, unspecified: Secondary | ICD-10-CM | POA: Diagnosis not present

## 2019-05-25 DIAGNOSIS — D631 Anemia in chronic kidney disease: Secondary | ICD-10-CM | POA: Diagnosis not present

## 2019-05-25 DIAGNOSIS — E559 Vitamin D deficiency, unspecified: Secondary | ICD-10-CM | POA: Diagnosis not present

## 2019-05-25 DIAGNOSIS — M109 Gout, unspecified: Secondary | ICD-10-CM | POA: Diagnosis not present

## 2019-05-25 DIAGNOSIS — Z7982 Long term (current) use of aspirin: Secondary | ICD-10-CM | POA: Diagnosis not present

## 2019-05-25 DIAGNOSIS — Z9181 History of falling: Secondary | ICD-10-CM | POA: Diagnosis not present

## 2019-05-25 DIAGNOSIS — J9621 Acute and chronic respiratory failure with hypoxia: Secondary | ICD-10-CM | POA: Diagnosis not present

## 2019-05-25 DIAGNOSIS — E1165 Type 2 diabetes mellitus with hyperglycemia: Secondary | ICD-10-CM | POA: Diagnosis not present

## 2019-05-25 DIAGNOSIS — Z8673 Personal history of transient ischemic attack (TIA), and cerebral infarction without residual deficits: Secondary | ICD-10-CM | POA: Diagnosis not present

## 2019-05-25 DIAGNOSIS — I252 Old myocardial infarction: Secondary | ICD-10-CM | POA: Diagnosis not present

## 2019-05-25 DIAGNOSIS — E1140 Type 2 diabetes mellitus with diabetic neuropathy, unspecified: Secondary | ICD-10-CM | POA: Diagnosis not present

## 2019-05-25 DIAGNOSIS — Z6831 Body mass index (BMI) 31.0-31.9, adult: Secondary | ICD-10-CM | POA: Diagnosis not present

## 2019-05-25 DIAGNOSIS — Z8616 Personal history of COVID-19: Secondary | ICD-10-CM | POA: Diagnosis not present

## 2019-05-25 DIAGNOSIS — Z9981 Dependence on supplemental oxygen: Secondary | ICD-10-CM | POA: Diagnosis not present

## 2019-05-25 DIAGNOSIS — M5126 Other intervertebral disc displacement, lumbar region: Secondary | ICD-10-CM | POA: Diagnosis not present

## 2019-05-25 DIAGNOSIS — I5043 Acute on chronic combined systolic (congestive) and diastolic (congestive) heart failure: Secondary | ICD-10-CM | POA: Diagnosis not present

## 2019-05-25 DIAGNOSIS — E669 Obesity, unspecified: Secondary | ICD-10-CM | POA: Diagnosis not present

## 2019-05-25 DIAGNOSIS — E1122 Type 2 diabetes mellitus with diabetic chronic kidney disease: Secondary | ICD-10-CM | POA: Diagnosis not present

## 2019-05-25 DIAGNOSIS — N1832 Chronic kidney disease, stage 3b: Secondary | ICD-10-CM | POA: Diagnosis not present

## 2019-05-25 DIAGNOSIS — Z8701 Personal history of pneumonia (recurrent): Secondary | ICD-10-CM | POA: Diagnosis not present

## 2019-05-25 DIAGNOSIS — M4800 Spinal stenosis, site unspecified: Secondary | ICD-10-CM | POA: Diagnosis not present

## 2019-05-27 ENCOUNTER — Telehealth: Payer: Self-pay | Admitting: Family Medicine

## 2019-05-27 NOTE — Telephone Encounter (Signed)
Maria was called and given verbal orders for patient ° °

## 2019-05-27 NOTE — Telephone Encounter (Signed)
Physical therapist Verdis Frederickson from [facility] called to request verbal orders for PT -1 week 1 -2 week 6 -1 week 2 Please follow up and a Voicemail is ok. [phone] p

## 2019-05-29 NOTE — Progress Notes (Signed)
This encounter was created in error - please disregard.

## 2019-05-30 ENCOUNTER — Ambulatory Visit: Payer: Medicare Other | Attending: Family Medicine | Admitting: Physician Assistant

## 2019-05-30 ENCOUNTER — Other Ambulatory Visit: Payer: Self-pay

## 2019-06-10 ENCOUNTER — Ambulatory Visit: Payer: Medicare Other | Attending: Family Medicine | Admitting: Family Medicine

## 2019-06-10 ENCOUNTER — Encounter: Payer: Self-pay | Admitting: Family Medicine

## 2019-06-10 ENCOUNTER — Telehealth: Payer: Self-pay

## 2019-06-10 ENCOUNTER — Other Ambulatory Visit: Payer: Self-pay

## 2019-06-10 VITALS — BP 161/86 | HR 82 | Ht 59.0 in | Wt 161.0 lb

## 2019-06-10 DIAGNOSIS — E7800 Pure hypercholesterolemia, unspecified: Secondary | ICD-10-CM | POA: Insufficient documentation

## 2019-06-10 DIAGNOSIS — E1140 Type 2 diabetes mellitus with diabetic neuropathy, unspecified: Secondary | ICD-10-CM | POA: Diagnosis not present

## 2019-06-10 DIAGNOSIS — I509 Heart failure, unspecified: Secondary | ICD-10-CM | POA: Diagnosis not present

## 2019-06-10 DIAGNOSIS — Z7982 Long term (current) use of aspirin: Secondary | ICD-10-CM | POA: Diagnosis not present

## 2019-06-10 DIAGNOSIS — I252 Old myocardial infarction: Secondary | ICD-10-CM | POA: Diagnosis not present

## 2019-06-10 DIAGNOSIS — Z794 Long term (current) use of insulin: Secondary | ICD-10-CM | POA: Insufficient documentation

## 2019-06-10 DIAGNOSIS — Z79899 Other long term (current) drug therapy: Secondary | ICD-10-CM | POA: Diagnosis not present

## 2019-06-10 DIAGNOSIS — Z0000 Encounter for general adult medical examination without abnormal findings: Secondary | ICD-10-CM | POA: Diagnosis not present

## 2019-06-10 DIAGNOSIS — Z8673 Personal history of transient ischemic attack (TIA), and cerebral infarction without residual deficits: Secondary | ICD-10-CM | POA: Insufficient documentation

## 2019-06-10 DIAGNOSIS — I110 Hypertensive heart disease with heart failure: Secondary | ICD-10-CM | POA: Diagnosis not present

## 2019-06-10 LAB — GLUCOSE, POCT (MANUAL RESULT ENTRY): POC Glucose: 225 mg/dl — AB (ref 70–99)

## 2019-06-10 MED ORDER — ACCU-CHEK GUIDE W/DEVICE KIT: 1.0000 | PACK | Freq: Three times a day (TID) | 0 refills | Status: DC

## 2019-06-10 MED ORDER — ACCU-CHEK GUIDE VI STRP: ORAL_STRIP | 12 refills | Status: DC

## 2019-06-10 MED ORDER — ACCU-CHEK FASTCLIX LANCETS MISC: 12 refills | Status: DC

## 2019-06-10 MED ORDER — MISC. DEVICES MISC: 0 refills | Status: DC

## 2019-06-10 NOTE — Progress Notes (Signed)
States that she get SOB when walking. Isosorbide makes her eyes water and nose itch.

## 2019-06-10 NOTE — Telephone Encounter (Signed)
Met with the patient when she was in the clinic today.  Explained the community paramedicine program and she was very interested and in agreement with having a referral placed for the program.  She was not interested in [facility]. She picked up the letter for [place] noting her need for O2 in the event of a power outage.    She said that her back up O2 tank was delivered empty and her daughter was supposed to call [employer].  She also said that her battery on her POC runs out very quickly.  This CM to call [employer].  Call placed to [place], spoke to [place] and explained patient's concerns about POC battery and back up O2 tank.  She said that the patient and her daughter are hard to reach. Verified the number she has for both patient and her daughter. She will plan to call daughter, [name], regarding the O2 concerns.

## 2019-06-10 NOTE — Progress Notes (Signed)
Subjective:   Kara Hanson is a 64 y.o. female who presents for Medicare Annual (Subsequent) preventive examination. She states her Pap smear and mammogram were last year while she was in [place] and she was informed they were normal. She is not up-to-date on colonoscopy.  Review of Systems:  General: negative for fever, weight loss, appetite change Eyes: no visual symptoms. ENT: no ear symptoms, no sinus tenderness, no nasal congestion or sore throat. Neck: no pain  Respiratory: no wheezing, +shortness of breath, cough Cardiovascular: no chest pain, + dyspnea on exertion, +pedal edema, no orthopnea. Gastrointestinal: no abdominal pain, no diarrhea, no constipation Genito-Urinary: no urinary frequency, no dysuria, no polyuria. Hematologic: no bruising Endocrine: + cold intolerance Neurological: no headaches, no seizures, no tremors Musculoskeletal: no joint pains, no joint swelling Skin: no pruritus, no rash. Psychological: no depression, no anxiety,          Objective:     Vitals: BP (!) 161/86   Pulse 82   Ht 4' 11" (1.499 m)   Wt 161 lb (73 kg)   SpO2 100%   BMI 32.52 kg/m   Body mass index is 32.52 kg/m.  Advanced Directives 06/10/2019 05/20/2019 05/18/2019 04/18/2019 02/11/2019 01/27/2019 01/23/2019  Does Patient Have a Medical Advance Directive? _0  No No  Would patient like information on creating a medical advance directive? Yes (MAU/Ambulatory/Procedural Areas - Information given) No - Patient declined - No - Patient declined No - Patient declined No - Patient declined No - Patient declined    Tobacco Social History   Tobacco Use  Smoking Status Never Smoker  Smokeless Tobacco Never Used     Counseling given: Not Answered   Clinical Intake:  Pre-visit preparation completed: Yes  Pain : No/denies pain     Diabetes: Yes CBG done?: Yes CBG resulted in Enter/ Edit results?: Yes Did pt. bring in CBG monitor from home?: No      Interpreter Needed?: No     Past Medical History:  Diagnosis Date  . CHF (congestive heart failure) ([place])   . CVA (cerebral vascular accident) ([place])   . Diabetes mellitus without complication ([place])   . Dyspnea   . Hypercholesteremia   . Hypertension   . Myocardial infarction ([place])   . Pneumonia 11/01/2018  . Spinal stenosis    Past Surgical History:  Procedure Laterality Date  . BIOPSY  01/27/2019   Procedure: BIOPSY;  Surgeon: Otis Brace, MD;  Location: [facility];  Service: Gastroenterology;;  . BLADDER SURGERY    . CARDIAC CATHETERIZATION N/A 04/25/2016   Procedure: Left Heart Cath and Coronary Angiography;  Surgeon: Lorretta Harp, MD;  Location: [facility];  Service: Cardiovascular;  Laterality: N/A;  . CARDIAC CATHETERIZATION  02/11/2019  . CESAREAN SECTION    . CHOLECYSTECTOMY    . ESOPHAGOGASTRODUODENOSCOPY (EGD) WITH PROPOFOL N/A 01/27/2019   Procedure: ESOPHAGOGASTRODUODENOSCOPY (EGD) WITH PROPOFOL;  Surgeon: Otis Brace, MD;  Location: [facility];  Service: Gastroenterology;  Laterality: N/A;  . RIGHT/LEFT HEART CATH AND CORONARY ANGIOGRAPHY N/A 02/11/2019   Procedure: RIGHT/LEFT HEART CATH AND CORONARY ANGIOGRAPHY;  Surgeon: Jolaine Artist, MD;  Location: [facility];  Service: Cardiovascular;  Laterality: N/A;  . VIDEO BRONCHOSCOPY N/A 02/01/2019   Procedure: VIDEO BRONCHOSCOPY WITHOUT FLUORO;  Surgeon: Candee Furbish, MD;  Location: [facility];  Service: Endoscopy;  Laterality: N/A;   Family History  Problem Relation Age of Onset  . Diabetes Mellitus II Father   .  Stroke Father   . Healthy Mother        She is 7 years old.    Social History   Socioeconomic History  . Marital status: Widowed    Spouse name: Not on file  . Number of children: Not on file  . Years of education: Not on file  . Highest education level: Not on file  Occupational History  . Not on file  Tobacco Use  . Smoking status: Never Smoker   . Smokeless tobacco: Never Used  Substance and Sexual Activity  . Alcohol use: No  . Drug use: No  . Sexual activity: Not Currently    Birth control/protection: None  Other Topics Concern  . Not on file  Social History Narrative  . Not on file   Social Determinants of Health   Financial Resource Strain:   . Difficulty of Paying Living Expenses: Not on file  Food Insecurity:   . Worried About Charity fundraiser in the Last Year: Not on file  . Ran Out of Food in the Last Year: Not on file  Transportation Needs:   . Lack of Transportation (Medical): Not on file  . Lack of Transportation (Non-Medical): Not on file  Physical Activity:   . Days of Exercise per Week: Not on file  . Minutes of Exercise per Session: Not on file  Stress:   . Feeling of Stress : Not on file  Social Connections:   . Frequency of Communication with Friends and Family: Not on file  . Frequency of Social Gatherings with Friends and Family: Not on file  . Attends Religious Services: Not on file  . Active Member of Clubs or Organizations: Not on file  . Attends Archivist Meetings: Not on file  . Marital Status: Not on file    Outpatient Encounter Medications as of 06/10/2019  Medication Sig  . Accu-Chek FastClix Lancets MISC USE AS DIRECTED TO TEST BLOOD SUGAR THREE TIMES DAILY  . allopurinol (ZYLOPRIM) 300 MG tablet Take 300 mg by mouth daily.  Marland Kitchen aspirin EC 81 MG tablet Take 81 mg by mouth daily.  Marland Kitchen atorvastatin (LIPITOR) 80 MG tablet Take 80 mg by mouth daily at 6 PM.  . Blood Glucose Monitoring Suppl (ACCU-CHEK GUIDE) w/Device KIT 1 each by Does not apply route 3 (three) times daily.  . carvedilol (COREG) 25 MG tablet Take 1 tablet (25 mg total) by mouth 2 (two) times daily with a meal.  . DULoxetine (CYMBALTA) 30 MG capsule Take 1 capsule (30 mg total) by mouth daily.  Marland Kitchen EASY COMFORT PEN NEEDLES 31G X 5 MM MISC USE FOUR TIMES PER DAY FOR INSULIN ADMINISTRATION (Patient taking differently:  4 (four) times daily. )  . furosemide (LASIX) 80 MG tablet Take 80 mg by mouth 2 (two) times daily.  Marland Kitchen gabapentin (NEURONTIN) 100 MG capsule Take 1 capsule (100 mg total) by mouth at bedtime.  Marland Kitchen glucose blood (ACCU-CHEK GUIDE) test strip USE AS DIRECTED TO TEST BLOOD SUGAR THREE TIMES DAILY  . insulin aspart (NOVOLOG) 100 UNIT/ML injection 0 to 12 units subcutaneously 3 times daily before meals as per sliding scale  . Insulin Glargine (BASAGLAR KWIKPEN) 100 UNIT/ML SOPN Inject 0.15 mLs (15 Units total) into the skin at bedtime.  Elmore Guise Device MISC Use as instructed 3 times daily  . lansoprazole (PREVACID) 15 MG capsule Take 15 mg by mouth daily.   . metoCLOPramide (REGLAN) 5 MG tablet Take 1 tablet (5 mg total) by  mouth 3 (three) times daily.  . Misc. Devices MISC Portable oxygen concentrator.  Diagnosis-chronic respiratory failure.  . Misc. Devices MISC Rollaor with seat. Dx: Congestive Heart Failure  . Multiple Vitamins-Minerals (MULTIVITAMIN WITH MINERALS) tablet Take 1 tablet by mouth daily.  . polyethylene glycol (MIRALAX / GLYCOLAX) 17 g packet Take 17 g by mouth daily as needed for moderate constipation.  . sodium chloride (OCEAN) 0.65 % SOLN nasal spray Place 1 spray into both nostrils as needed for congestion.  . SUPER B COMPLEX/C CAPS Take 1 capsule by mouth daily.  . [DISCONTINUED] Accu-Chek FastClix Lancets MISC USE AS DIRECTED TO TEST BLOOD SUGAR THREE TIMES DAILY  . [DISCONTINUED] Blood Glucose Monitoring Suppl (ACCU-CHEK GUIDE) w/Device KIT 1 each by Does not apply route 3 (three) times daily.  . [DISCONTINUED] glucose blood (ACCU-CHEK GUIDE) test strip USE AS DIRECTED TO TEST BLOOD SUGAR THREE TIMES DAILY  . isosorbide mononitrate (IMDUR) 30 MG 24 hr tablet Take 1 tablet (30 mg total) by mouth daily. (Patient not taking: Reported on 06/10/2019)  . Misc. Devices MISC Scale; Dx - CHF   No facility-administered encounter medications on file as of 06/10/2019.    Activities of Daily  Living In your present state of health, do you have any difficulty performing the following activities: 06/10/2019 05/20/2019  Hearing? N -  Vision? Y -  Difficulty concentrating or making decisions? N -  Walking or climbing stairs? Y -  Comment - -  Dressing or bathing? Y -  Doing errands, shopping? Y N  Some recent data might be hidden    Constitutional: normal appearing,  Eyes: PERRLA HEENT: Head is atraumatic, normal sinuses, normal oropharynx, normal appearing tonsils and palate, tympanic membrane is normal bilaterally. Neck: normal range of motion, no thyromegaly, no JVD Cardiovascular: normal rate and rhythm, normal heart sounds, no murmurs, rub or gallop, 2+ R pedal edema, 1+L pedal edema Respiratory: Normal breath sounds, clear to auscultation bilaterally, no wheezes, no rales, no rhonchi Abdomen: soft, not tender to palpation, normal bowel sounds, no enlarged organs Musculoskeletal: Full ROM, no tenderness in joints Skin: warm and dry, no lesions. Neurological: alert, oriented x3, cranial nerves I-XII grossly intact , reduced motor strength globally, normal sensation. Psychological: normal mood.   Patient Care Team: [name], MD as PCP - General (Family Medicine) Jerline Pain, MD as PCP - Cardiology (Cardiology)    Assessment:   This is a routine wellness examination for [place].  Exercise Activities and Dietary recommendations    Goals   None     Fall Risk Fall Risk  06/10/2019 06/10/2019 05/08/2019 02/27/2019 01/08/2019  Falls in the past year? 0 0 1 0 0  Number falls in past yr: - - 0 - -  Injury with Fall? - - 0 - -   Is the patient's home free of loose throw rugs in walkways, pet beds, electrical cords, etc?   no      Grab bars in the bathroom? yes      Handrails on the stairs?   n/a      Adequate lighting?   yes Timed Get Up and Go performed: yes  Depression Screen PHQ 2/9 Scores 06/10/2019 05/08/2019 11/14/2017 09/28/2017  PHQ - 2 Score 2 0 3 3  PHQ- 9  Score 9 0 14 13     Cognitive Function  None      Immunization History  Administered Date(s) Administered  . Pneumococcal Polysaccharide-23 11/03/2018    Qualifies for Shingles Vaccine? Yes -  declines  Screening Tests Health Maintenance  Topic Date Due  . OPHTHALMOLOGY EXAM  12/25/1964  . TETANUS/TDAP  12/25/1973  . PAP SMEAR-Modifier  12/26/1975  . COLONOSCOPY  12/25/2004  . FOOT EXAM  03/30/2019  . URINE MICROALBUMIN  03/30/2019  . INFLUENZA VACCINE  07/03/2019 (Originally 11/03/2018)  . MAMMOGRAM  08/16/2019  . HEMOGLOBIN A1C  10/16/2019  . Hepatitis C Screening  Completed  . HIV Screening  Completed    Cancer Screenings: Lung: Low Dose CT Chest recommended if Age 77-80 years, 30 pack-year currently smoking OR have quit w/in 15years. Patient does not qualify. Breast:  Up to date on Mammogram? Yes   Up to date of Bone Density/Dexa? No Colorectal:  no  Additional Screenings: completed Hepatitis C Screening:      Plan:      1. Encounter for Medicare annual wellness exam Counseled on 150 minutes of exercise per week, healthy eating (including decreased daily intake of saturated fats, cholesterol, added sugars, sodium), routine healthcare maintenance. She will have her daughter give the clinic a call with the name of the clinic where she had her Pap smear and mammogram so this can be recorded. Referred to [facility] late last year for colonoscopy however they were unable to reach the patient I have provided her with GI number to call and reschedule appointment.   2. Type 2 diabetes mellitus with diabetic neuropathy, with long-term current use of insulin ([place]) Has upcoming appointment to address chronic medical conditions - POCT glucose (manual entry) - Microalbumin / creatinine urine ratio - Misc. Devices MISC; Scale; Dx - CHF  Dispense: 1 each; Refill: 0 - Accu-Chek FastClix Lancets MISC; USE AS DIRECTED TO TEST BLOOD SUGAR THREE TIMES DAILY  Dispense: 102 each;  Refill: 12 - Blood Glucose Monitoring Suppl (ACCU-CHEK GUIDE) w/Device KIT; 1 each by Does not apply route 3 (three) times daily.  Dispense: 1 kit; Refill: 0 - glucose blood (ACCU-CHEK GUIDE) test strip; USE AS DIRECTED TO TEST BLOOD SUGAR THREE TIMES DAILY  Dispense: 100 each; Refill: 12  I have personally reviewed and noted the following in the patient's chart:   . Medical and social history . Use of alcohol, tobacco or illicit drugs  . Current medications and supplements . Functional ability and status . Nutritional status . Physical activity . Advanced directives . List of other physicians . Hospitalizations, surgeries, and ER visits in previous 12 months . Vitals . Screenings to include cognitive, depression, and falls . Referrals and appointments  In addition, I have reviewed and discussed with patient certain preventive protocols, quality metrics, and best practice recommendations. A written personalized care plan for preventive services as well as general preventive health recommendations were provided to patient.   We have contacted the cardiology office to obtain a sooner appointment with her cardiologist.  [name], MD  06/10/2019

## 2019-06-10 NOTE — Patient Instructions (Addendum)
Please call for your colonoscopy appointment with [facility] Ph# [phone] #[address] .   Kara Hanson , Thank you for taking time to come for your Medicare Wellness Visit. I appreciate your ongoing commitment to your health goals. Please review the following plan we discussed and let me know if I can assist you in the future.   These are the goals we discussed: Goals   None     This is a list of the screening recommended for you and due dates:  Health Maintenance  Topic Date Due  . Eye exam for diabetics  12/25/1964  . Tetanus Vaccine  12/25/1973  . Pap Smear  12/26/1975  . Colon Cancer Screening  12/25/2004  . Complete foot exam   03/30/2019  . Urine Protein Check  03/30/2019  . Flu Shot  07/03/2019*  . Mammogram  08/16/2019  . Hemoglobin A1C  10/16/2019  .  Hepatitis C: One time screening is recommended by Center for Disease Control  (CDC) for  adults born from [birth year] through 1966.   Completed  . HIV Screening  Completed  *Topic was postponed. The date shown is not the original due date.

## 2019-06-11 ENCOUNTER — Telehealth: Payer: Self-pay

## 2019-06-11 NOTE — Telephone Encounter (Signed)
Call placed to [facility], spoke to [facility] to schedule appointment for patient.   - 06/24/2019 @ 1145.   Call placed to the patient and informed her of the appointment. She has the phone number for the practice as well as the address: [address].   No questions at this time.

## 2019-06-12 ENCOUNTER — Telehealth: Payer: Self-pay

## 2019-06-12 LAB — MICROALBUMIN / CREATININE URINE RATIO
Creatinine, Urine: 47.2 mg/dL
Microalb/Creat Ratio: 8337 mg/g creat — ABNORMAL HIGH (ref 0–29)
Microalbumin, Urine: 3935.1 ug/mL

## 2019-06-12 NOTE — Telephone Encounter (Signed)
Community paramedicine referral sent via secure email to [facility]

## 2019-06-14 ENCOUNTER — Telehealth: Payer: Self-pay | Admitting: Family Medicine

## 2019-06-14 NOTE — Telephone Encounter (Signed)
Kara Hanson states that patient has missed to PT appointment with home heath, they are unable to reach patient.

## 2019-06-14 NOTE — Telephone Encounter (Signed)
Kasey from [facility] called and requested to speak with pcp nurse regarding the patient. Please follow up at your earliest convenience.

## 2019-06-17 NOTE — Telephone Encounter (Signed)
Call placed to Kasey/[facility] # [phone], message left with call back requested to this CM.

## 2019-06-17 NOTE — Telephone Encounter (Signed)
Do you mind following up on this?  Thank

## 2019-06-18 ENCOUNTER — Telehealth (HOSPITAL_COMMUNITY): Payer: Self-pay

## 2019-06-18 ENCOUNTER — Telehealth: Payer: Self-pay

## 2019-06-18 NOTE — Telephone Encounter (Signed)
Attempted to contact Kara Hanson however no answer and voicemail box full. I reached out to her daughter [name] and her voicemail box was also full. I will continue to reach out until spoken to.

## 2019-06-18 NOTE — Telephone Encounter (Signed)
Call received from Kasey/[facility].  She explained that they have made multiple attempts to contact the patient with her primary and secondary contact and they have not been able to reach the patient.  They have not received calls back from messages that were left and they have not been able to leave voicemail messages at other times due to voicemail being full.  Call placed to patient and explained above.  She said she did not want home PT.  Also informed her that Elmon Kirschner has been trying to reach her.  Provided her with #[phone] for  Heather's work phone . She said that she has not been answering calls when she does not know the number but now she will answer it.    Call placed to K[facility]y/[facility]. Message left noting that the patient no longer wants home PT. CM call back # left in the event she has questions.

## 2019-06-18 NOTE — Telephone Encounter (Signed)
Call placed to [facility] # [phone], message left with call back requested to this CM # [phone]

## 2019-06-19 NOTE — Progress Notes (Deleted)
CARDIOLOGY OFFICE NOTE  Date:  06/19/2019    [name] Date of Birth: [birth year] Medical Record #[id number]  PCP:  [name], MD  Cardiologist:  Marlou Porch   No chief complaint on file.   History of Present Illness: Kara Hanson is a 64 y.o. female who presents today for a follow up visit. Seen for Dr. Marlou Porch.   She has a history of NICM with prior EF down to 30 to 35%, normal coronaries by prior cath and now with EF at 50% per echo in 2019. Other issues include abnormal EKG (felt to be due to stress induced CM) mild AS, spinal stenosis and history of vertigo.   Last seen in July by Dr. Marlou Porch and was felt to be doing ok from our standpoint.   The patient {does/does not:200015} have symptoms concerning for COVID-19 infection (fever, chills, cough, or new shortness of breath).   Comes in today. Here with   Past Medical History:  Diagnosis Date  . CHF (congestive heart failure) ([place])   . CVA (cerebral vascular accident) ([place])   . Diabetes mellitus without complication ([place])   . Dyspnea   . Hypercholesteremia   . Hypertension   . Myocardial infarction ([place])   . Pneumonia 11/01/2018  . Spinal stenosis     Past Surgical History:  Procedure Laterality Date  . BIOPSY  01/27/2019   Procedure: BIOPSY;  Surgeon: Otis Brace, MD;  Location: [facility];  Service: Gastroenterology;;  . BLADDER SURGERY    . CARDIAC CATHETERIZATION N/A 04/25/2016   Procedure: Left Heart Cath and Coronary Angiography;  Surgeon: Lorretta Harp, MD;  Location: [facility];  Service: Cardiovascular;  Laterality: N/A;  . CARDIAC CATHETERIZATION  02/11/2019  . CESAREAN SECTION    . CHOLECYSTECTOMY    . ESOPHAGOGASTRODUODENOSCOPY (EGD) WITH PROPOFOL N/A 01/27/2019   Procedure: ESOPHAGOGASTRODUODENOSCOPY (EGD) WITH PROPOFOL;  Surgeon: Otis Brace, MD;  Location: [facility];  Service: Gastroenterology;  Laterality: N/A;  . RIGHT/LEFT HEART CATH AND CORONARY  ANGIOGRAPHY N/A 02/11/2019   Procedure: RIGHT/LEFT HEART CATH AND CORONARY ANGIOGRAPHY;  Surgeon: Jolaine Artist, MD;  Location: [facility];  Service: Cardiovascular;  Laterality: N/A;  . VIDEO BRONCHOSCOPY N/A 02/01/2019   Procedure: VIDEO BRONCHOSCOPY WITHOUT FLUORO;  Surgeon: Candee Furbish, MD;  Location: [facility];  Service: Endoscopy;  Laterality: N/A;     Medications: No outpatient medications have been marked as taking for the 06/24/19 encounter (Appointment) with Burtis Junes, NP.     Allergies: Allergies  Allergen Reactions  . Garlic Shortness Of Breath, Itching and Swelling    Raw*  Hand itching and swelling  . Latex Itching  . Morphine And Related Itching and Other (See Comments)    Headache   . Other Itching    Reaction to newspaper ink -itching and headache    Social History: The patient  reports that she has never smoked. She has never used smokeless tobacco. She reports that she does not drink alcohol or use drugs.   Family History: The patient's ***family history includes Diabetes Mellitus II in her father; Healthy in her mother; Stroke in her father.   Review of Systems: Please see the history of present illness.   All other systems are reviewed and negative.   Physical Exam: VS:  There were no vitals taken for this visit. Marland Kitchen  BMI There is no height or weight on file to calculate BMI.  Wt Readings from Last 3 Encounters:  06/10/19  161 lb (73 kg)  05/22/19 154 lb 14.4 oz (70.3 kg)  04/20/19 156 lb 8.4 oz (71 kg)    General: Pleasant. Well developed, well nourished and in no acute distress.   HEENT: Normal.  Neck: Supple, no JVD, carotid bruits, or masses noted.  Cardiac: ***Regular rate and rhythm. No murmurs, rubs, or gallops. No edema.  Respiratory:  Lungs are clear to auscultation bilaterally with normal work of breathing.  GI: Soft and nontender.  MS: No deformity or atrophy. Gait and ROM intact.  Skin: Warm and dry. Color is  normal.  Neuro:  Strength and sensation are intact and no gross focal deficits noted.  Psych: Alert, appropriate and with normal affect.   LABORATORY DATA:  EKG:  EKG {ACTION; IS/IS FYB:01751025} ordered today. This demonstrates ***.  Lab Results  Component Value Date   WBC 4.5 05/21/2019   HGB 8.4 (L) 05/21/2019   HCT 27.0 (L) 05/21/2019   PLT 212 05/21/2019   GLUCOSE 65 (L) 05/22/2019   CHOL 353 (H) 01/09/2019   TRIG 108 02/01/2019   HDL 52 01/09/2019   LDLCALC 251 (H) 01/09/2019   ALT 10 02/10/2019   AST 17 02/10/2019   NA 139 05/22/2019   K 4.1 05/22/2019   CL 100 05/22/2019   CREATININE 1.94 (H) 05/22/2019   BUN 55 (H) 05/22/2019   CO2 32 05/22/2019   TSH 3.480 02/27/2019   INR 1.1 02/09/2019   HGBA1C 7.4 (H) 04/18/2019     BNP (last 3 results) Recent Labs    04/19/19 0216 04/20/19 0218 05/18/19 0926  BNP 465.8* 290.1* 875.8*    ProBNP (last 3 results) No results for input(s): PROBNP in the last 8760 hours.   Other Studies Reviewed Today:  ECHO 2019 Study Conclusions  - Left ventricle: The cavity size was normal. Wall thickness was increased in a pattern of mild LVH. Systolic function was normal. The estimated ejection fraction was in the range of 55% to 60%. Wall motion was normal; there were no regional wall motion abnormalities. Doppler parameters are consistent with abnormal left ventricular relaxation (grade 1 diastolic dysfunction). The E/e&' ratio is >15, suggesting elevated LV filling pressure. - Aortic valve: Poorly visualized. Mildly calcified leaflets. Mild stenosis. Mean gradient (S): 10 mm Hg. Peak gradient (S): 16 mm Hg. Valve area (VTI): 1.46 cm^2. Valve area (Vmax): 1.34 cm^2. Valve area (Vmean): 1.4 cm^2. - Mitral valve: Mildly thickened leaflets . There was trivial regurgitation. - Left atrium: The atrium was normal in size. - Inferior vena cava: The vessel was normal in size. The respirophasic diameter  changes were in the normal range (>= 50%), consistent with normal central venous pressure.  Impressions:  - Compared to a prior study in 06/2016, the LVEF is unchanged. There is diastolic dysfunction with elevated LV filling pressure. There is likely mild aortic stenosis with mean gradient of 10 mmHg.  - Left ventricle: The cavity size was normal. Systolic function was at the lower limits of normal. The estimated ejection fraction was in the range of 50% to 55%. Wall motion was normal; there were no regional wall motion abnormalities. Features are consistent with a pseudonormal left ventricular filling pattern, with concomitant abnormal relaxation and increased filling pressure (grade 2 diastolic dysfunction).    ASSESSMENT & PLAN:    1. History of NICM - probable Takotsubo - EF has recovered -   2. HTN  3. HLD  4. Mild AS   5. COVID-19 Education: The signs and symptoms of COVID-19  were discussed with the patient and how to seek care for testing (follow up with PCP or arrange E-visit).  The importance of social distancing, staying at home, hand hygiene and wearing a mask when out in public were discussed today.  Current medicines are reviewed with the patient today.  The patient does not have concerns regarding medicines other than what has been noted above.  The following changes have been made:  See above.  Labs/ tests ordered today include:   No orders of the defined types were placed in this encounter.    Disposition:   FU with *** in {gen number 2-11:941740} {Days to years:10300}.   Patient is agreeable to this plan and will call if any problems develop in the interim.   SignedTruitt Merle, NP  06/19/2019 7:54 AM  [facility] [address] [place], [place]  [address] Phone: [phone] Fax: [phone]

## 2019-06-24 ENCOUNTER — Telehealth: Payer: Self-pay

## 2019-06-24 ENCOUNTER — Ambulatory Visit: Payer: Medicare Other | Admitting: Nurse Practitioner

## 2019-06-24 DIAGNOSIS — J9621 Acute and chronic respiratory failure with hypoxia: Secondary | ICD-10-CM | POA: Diagnosis not present

## 2019-06-24 NOTE — Telephone Encounter (Signed)
Call returned to patient's daughter, [name].  She said that her mother's feet are " swollen like elephant's legs."  She also said that her mother has been more short of breath despite using the O2 continuously @ 3L.  She was inquiring about an appointment for her mother this afternoon.  She said that she has been trying to have her mother contact the doctor about her symptoms but she has not called anyone yet.  Instructed [name]  her to have her mother seen in ED or Urgent Care today.  This CM informed [name] that her mother missed an appointment with cardiology today.  [name] said that she was not aware of the appointment.  She said that she needs to order [facility] for the appointments. Informed her that her mother is eligible for SCAT and medicaid transportation.  She can complete a SCAT application if interested. She can also contact medicaid to register for transportation to medical appointments.  [name] requested that the number for medicaid be text to her and this CM text the number as requested.

## 2019-06-25 ENCOUNTER — Telehealth (HOSPITAL_COMMUNITY): Payer: Self-pay

## 2019-06-25 NOTE — Telephone Encounter (Signed)
Thanks for the update.  Is it possible to keep Kara Hanson in the loop with regards to appointments so we can improve her compliance rate?  Thank you

## 2019-06-25 NOTE — Telephone Encounter (Signed)
Spoke to [place] who agreed for home visit tomorrow Weds 3/24 at 1400.

## 2019-06-26 ENCOUNTER — Other Ambulatory Visit: Payer: Self-pay | Admitting: Family Medicine

## 2019-06-26 ENCOUNTER — Telehealth: Payer: Self-pay

## 2019-06-26 ENCOUNTER — Other Ambulatory Visit: Payer: Self-pay

## 2019-06-26 DIAGNOSIS — R251 Tremor, unspecified: Secondary | ICD-10-CM

## 2019-06-26 NOTE — Progress Notes (Signed)
Paramedicine Encounter    Patient ID: Kara Hanson, female    DOB: 12-23-1955, 64 y.o.   MRN: [id number]   Patient Care Team: [name], MD as PCP - General (Family Medicine) Jerline Pain, MD as PCP - Cardiology (Cardiology)  Patient Active Problem List   Diagnosis Date Noted  . Acute systolic CHF (congestive heart failure) ([place]) 05/19/2019  . Acute exacerbation of CHF (congestive heart failure) ([place]) 04/18/2019  . Elevated troponin 04/18/2019  . Acute respiratory failure with hypoxia ([place]) 02/09/2019  . NSTEMI (non-ST elevated myocardial infarction) ([place]) 02/09/2019  . S/P thoracentesis   . Atelectasis   . Chronic respiratory failure ([place])   . Acute renal failure superimposed on stage 3 chronic kidney disease ([place])   . Epigastric pain   . CHF (congestive heart failure) ([place]) 01/23/2019  . CAP (community acquired pneumonia) 10/31/2018  . Acute CHF (congestive heart failure) ([place]) 09/24/2018  . SIRS (systemic inflammatory response syndrome) ([place]) 07/25/2018  . COVID-19 virus infection 07/20/2018  . Chronic diastolic CHF (congestive heart failure) ([place]) 07/20/2018  . Pneumonia due to COVID-19 virus 07/20/2018  . Vitamin D deficiency 04/02/2018  . Spinal stenosis 03/29/2018  . Acute on chronic respiratory failure with hypoxia ([place]) 03/20/2018  . Controlled type 2 diabetes with neuropathy ([place]) 03/20/2018  . CKD (chronic kidney disease), stage III 03/20/2018  . Acute on chronic combined systolic and diastolic CHF (congestive heart failure) ([place]) 11/27/2017  . AKI (acute kidney injury) ([place]) 11/27/2017  . Acute respiratory distress 11/27/2017  . Bulging lumbar disc 11/14/2017  . Gout 11/14/2017  . Hyperlipidemia LDL goal <70 09/30/2016  . Chest pain 06/17/2016  . Stress-induced cardiomyopathy 05/19/2016  . Type 2 diabetes mellitus with diabetic neuropathy, unspecified ([place]) 05/06/2016  . Vertigo 05/06/2016  . Controlled type 2 diabetes mellitus with hyperglycemia ([place])  04/23/2016  . Hypertension 04/23/2016  . Normochromic normocytic anemia 04/23/2016  . Syncope 04/23/2016    Current Outpatient Medications:  .  allopurinol (ZYLOPRIM) 300 MG tablet, Take 300 mg by mouth daily., Disp: , Rfl:  .  aspirin EC 81 MG tablet, Take 81 mg by mouth daily., Disp: , Rfl:  .  carvedilol (COREG) 25 MG tablet, Take 1 tablet (25 mg total) by mouth 2 (two) times daily with a meal., Disp: 60 tablet, Rfl: 3 .  EASY COMFORT PEN NEEDLES 31G X 5 MM MISC, USE FOUR TIMES PER DAY FOR INSULIN ADMINISTRATION (Patient taking differently: 4 (four) times daily. ), Disp: 200 each, Rfl: 12 .  furosemide (LASIX) 80 MG tablet, Take 80 mg by mouth 2 (two) times daily., Disp: , Rfl:  .  gabapentin (NEURONTIN) 100 MG capsule, Take 1 capsule (100 mg total) by mouth at bedtime., Disp:  , Rfl:  .  insulin aspart (NOVOLOG) 100 UNIT/ML injection, 0 to 12 units subcutaneously 3 times daily before meals as per sliding scale, Disp: 30 mL, Rfl: 3 .  Insulin Glargine (BASAGLAR KWIKPEN) 100 UNIT/ML SOPN, Inject 0.15 mLs (15 Units total) into the skin at bedtime., Disp: 30 mL, Rfl: 3 .  lansoprazole (PREVACID) 15 MG capsule, Take 15 mg by mouth daily. , Disp: , Rfl:  .  Misc. Devices MISC, Portable oxygen concentrator.  Diagnosis-chronic respiratory failure., Disp: 1 each, Rfl: 0 .  Misc. Devices MISC, [place] with seat. Dx: Congestive Heart Failure, Disp: 1 each, Rfl: 0 .  Accu-Chek FastClix Lancets MISC, USE AS DIRECTED TO TEST BLOOD SUGAR THREE TIMES DAILY (Patient not taking: Reported on 06/26/2019), Disp: 102 each,  Rfl: 12 .  atorvastatin (LIPITOR) 80 MG tablet, Take 80 mg by mouth daily at 6 PM., Disp: , Rfl:  .  Blood Glucose Monitoring Suppl (ACCU-CHEK GUIDE) w/Device KIT, 1 each by Does not apply route 3 (three) times daily., Disp: 1 kit, Rfl: 0 .  DULoxetine (CYMBALTA) 30 MG capsule, Take 1 capsule (30 mg total) by mouth daily. (Patient not taking: Reported on 06/26/2019), Disp: 30 capsule, Rfl: 3 .   glucose blood (ACCU-CHEK GUIDE) test strip, USE AS DIRECTED TO TEST BLOOD SUGAR THREE TIMES DAILY (Patient not taking: Reported on 06/26/2019), Disp: 100 each, Rfl: 12 .  isosorbide mononitrate (IMDUR) 30 MG 24 hr tablet, Take 1 tablet (30 mg total) by mouth daily. (Patient not taking: Reported on 06/10/2019), Disp: 30 tablet, Rfl: 0 .  Lancet Device MISC, Use as instructed 3 times daily, Disp: 1 each, Rfl: 0 .  metoCLOPramide (REGLAN) 5 MG tablet, Take 1 tablet (5 mg total) by mouth 3 (three) times daily. (Patient not taking: Reported on 06/26/2019), Disp: 90 tablet, Rfl: 1 .  Misc. Devices MISC, Scale; Dx - CHF (Patient not taking: Reported on 06/26/2019), Disp: 1 each, Rfl: 0 .  Multiple Vitamins-Minerals (MULTIVITAMIN WITH MINERALS) tablet, Take 1 tablet by mouth daily., Disp: , Rfl:  .  polyethylene glycol (MIRALAX / GLYCOLAX) 17 g packet, Take 17 g by mouth daily as needed for moderate constipation. (Patient not taking: Reported on 06/26/2019), Disp: , Rfl:  .  sodium chloride (OCEAN) 0.65 % SOLN nasal spray, Place 1 spray into both nostrils as needed for congestion., Disp: , Rfl:  .  SUPER B COMPLEX/C CAPS, Take 1 capsule by mouth daily., Disp: , Rfl:  Allergies  Allergen Reactions  . Garlic Shortness Of Breath, Itching and Swelling    Raw*  Hand itching and swelling  . Latex Itching  . Morphine And Related Itching and Other (See Comments)    Headache   . Other Itching    Reaction to newspaper ink -itching and headache     Social History   Socioeconomic History  . Marital status: Widowed    Spouse name: Not on file  . Number of children: Not on file  . Years of education: Not on file  . Highest education level: Not on file  Occupational History  . Not on file  Tobacco Use  . Smoking status: Never Smoker  . Smokeless tobacco: Never Used  Substance and Sexual Activity  . Alcohol use: No  . Drug use: No  . Sexual activity: Not Currently    Birth control/protection: None  Other  Topics Concern  . Not on file  Social History Narrative  . Not on file   Social Determinants of Health   Financial Resource Strain:   . Difficulty of Paying Living Expenses:   Food Insecurity:   . Worried About Charity fundraiser in the Last Year:   . Arboriculturist in the Last Year:   Transportation Needs:   . Film/video editor (Medical):   Marland Kitchen Lack of Transportation (Non-Medical):   Physical Activity:   . Days of Exercise per Week:   . Minutes of Exercise per Session:   Stress:   . Feeling of Stress :   Social Connections:   . Frequency of Communication with Friends and Family:   . Frequency of Social Gatherings with Friends and Family:   . Attends Religious Services:   . Active Member of Clubs or Organizations:   . Attends  Club or Organization Meetings:   Marland Kitchen Marital Status:   Intimate Partner Violence:   . Fear of Current or Ex-Partner:   . Emotionally Abused:   Marland Kitchen Physically Abused:   . Sexually Abused:     Physical Exam Vitals reviewed.  HENT:     Head: Normocephalic.     Nose: Nose normal.     Mouth/Throat:     Mouth: Mucous membranes are moist.     Pharynx: Oropharynx is clear.  Eyes:     Pupils: Pupils are equal, round, and reactive to light.  Cardiovascular:     Rate and Rhythm: Normal rate and regular rhythm.     Pulses: Normal pulses.     Heart sounds: Normal heart sounds.  Pulmonary:     Effort: Pulmonary effort is normal.     Breath sounds: Normal breath sounds.  Abdominal:     Palpations: Abdomen is soft.     Tenderness: There is abdominal tenderness.     Comments: Tender and burning in upper abdomen.    Musculoskeletal:        General: Normal range of motion.     Cervical back: Normal range of motion.     Right lower leg: Edema present.     Left lower leg: Edema present.  Skin:    General: Skin is warm and dry.     Capillary Refill: Capillary refill takes less than 2 seconds.  Neurological:     Mental Status: She is alert. Mental  status is at baseline.  Psychiatric:        Mood and Affect: Mood normal.    Arrived for home visit for Kadejah who was seated on the edge of her bed alert and oriented wearing her oxygen at 4lpm. Milynn reports having some abdominal pain in the epigastric area and has been having same for quite sometime, she reports getting acid reflux often and takes her medication for same with no relief. Robena is to follow up with [facility] for an endoscopy/colonoscopy in May. (I scheduled same today) Vitals were obtained. Both legs appeared to be swollen. Cordella stated she has been drinking juice and ginger ale. I explained she needed to avoid sugary liquids and to reduce amount of liquids she drinks in a day and try to limit to water. She verbalized understanding. Myron also explained since having Covid she has a lack of appetite due to loss of smell. I encouraged her to eat 3 meals a day in compliance with a heart healthy diet. She verbalized understanding. Ladean stated she does get more short of breath when walking but denied chest pain or dizziness. I called and scheduled appointment for cardiology for Monday with Dr. Marlou Porch. Patient stated she will make sure she is there. Medications were reviewed and confirmed. Patient noted to be out of several medicines.  -Atorvastatin -ASA -Carvedilol (running low) -Duloxetine -Gabapentin -Multivitamin -Vit B -Reglan  -Lasix (running low) -CBG Kit and Supplies   I filled pill box with medicines we had and will fill accordingly once medicines are delivered.  I called in same to [facility] in which they stated they will fill and deliver.  Patient is in need of a scale which I will provide for her. Patient also expressed the need for a new referral for Neurology. I will send message to Dr. Margarita Rana for same. Patient was left with list of medicines and appointments as well as my contact number for her and her daughter. Patient was made aware if symptoms  worsened to call [phone]  and to call me if needed. Patient agreed. I will follow up with patient next week. Home visit complete.        Future Appointments  Date Time Provider [place]  07/01/2019 10:15 AM Burtis Junes, NP [facility]  07/10/2019  2:10 PM [name], MD [facility] None     ACTION: Home visit completed Next visit planned for one week

## 2019-06-26 NOTE — Progress Notes (Addendum)
CARDIOLOGY OFFICE NOTE  Date:  07/01/2019    Kara Hanson Date of Birth: [birth year] Medical Record #[id number]  PCP:  [name], MD  Cardiologist:  [facility]    Chief Complaint  Patient presents with  . Follow-up    Seen for Dr. Marlou Porch    History of Present Illness: Kara Hanson is a 64 y.o. female who presents today for a follow up visit. Seen for Dr. Marlou Porch.   She has a history of NICM with prior EF of 30 to 35% - normal coronaries on prior cath in [place] - improved EF up to 50% - concern for possible stress induced CM. Other issues include mild AS, prior CVA, DM, chronic anemia, spinal stenosis and vertigo.    Last seen back in July - felt to be doing ok.   Was hospitalized last month with CHF - as well as in January. Noted that that was her 7th admission in the past year.   The patient does not have symptoms concerning for COVID-19 infection (fever, chills, cough, or new shortness of breath).   Comes in today. Here alone. Notes more issues with swelling - hard to breath. Weight is up. She has not felt well for the last week and a half. She has been to see her PCP earlier this month - was told she was ok. She denies excessive salt. Says she is having trouble eating - has had COVID lat April - she lost her taste and smell. Still slow to recover. Little chest pain noted - felt tightness yesterday - went away - can't do anything due to being so short of breath. Still does not have scales at her house - sounds like EMT/paramedicine is seeing her. She says she is taking her medicines. She is off her Imdur - due to a "runny nose".   Noted last CXR last month with suspected mild CHF and left lower lobe atelectasis versus pneumonia.   Past Medical History:  Diagnosis Date  . Acute CHF (congestive heart failure) ([place]) 09/24/2018  . Acute on chronic combined systolic and diastolic CHF (congestive heart failure) ([place]) 11/27/2017  . Acute on chronic respiratory failure with  hypoxia ([place]) 03/20/2018  . Acute renal failure superimposed on stage 3 chronic kidney disease ([place])   . Acute respiratory distress 11/27/2017  . Acute respiratory failure with hypoxia ([place]) 02/09/2019  . Acute systolic CHF (congestive heart failure) ([place]) 05/19/2019  . AKI (acute kidney injury) ([place]) 11/27/2017  . Atelectasis   . Bulging lumbar disc 11/14/2017  . CAP (community acquired pneumonia) 10/31/2018  . Chest pain 06/17/2016  . CHF (congestive heart failure) ([place])   . Chronic diastolic CHF (congestive heart failure) ([place]) 07/20/2018  . Chronic respiratory failure ([place])   . CKD (chronic kidney disease), stage III 03/20/2018  . Controlled type 2 diabetes mellitus with hyperglycemia ([place]) 04/23/2016  . Controlled type 2 diabetes with neuropathy ([place]) 03/20/2018  . COVID-19 virus infection 07/20/2018  . CVA (cerebral vascular accident) ([place])   . Diabetes mellitus without complication ([place])   . Dyspnea   . Elevated troponin 04/18/2019  . Epigastric pain   . Gout 11/14/2017  . Hypercholesteremia   . Hyperlipidemia LDL goal <70 09/30/2016  . Hypertension   . Myocardial infarction ([place])   . Normochromic normocytic anemia 04/23/2016  . NSTEMI (non-ST elevated myocardial infarction) ([place]) 02/09/2019  . Pneumonia 11/01/2018  . Pneumonia due to COVID-19 virus 07/20/2018  . S/P thoracentesis   . SIRS (systemic inflammatory response  syndrome) ([place]) 07/25/2018  . Spinal stenosis   . Stress-induced cardiomyopathy 05/19/2016  . Syncope 04/23/2016  . Type 2 diabetes mellitus with diabetic neuropathy, unspecified ([place]) 05/06/2016  . Vertigo 05/06/2016  . Vitamin D deficiency 04/02/2018    Past Surgical History:  Procedure Laterality Date  . BIOPSY  01/27/2019   Procedure: BIOPSY;  Surgeon: Otis Brace, MD;  Location: [facility];  Service: Gastroenterology;;  . BLADDER SURGERY    . CARDIAC CATHETERIZATION N/A 04/25/2016   Procedure: Left Heart Cath and Coronary Angiography;  Surgeon: Lorretta Harp, MD;  Location: [facility];  Service: Cardiovascular;  Laterality: N/A;  . CARDIAC CATHETERIZATION  02/11/2019  . CESAREAN SECTION    . CHOLECYSTECTOMY    . ESOPHAGOGASTRODUODENOSCOPY (EGD) WITH PROPOFOL N/A 01/27/2019   Procedure: ESOPHAGOGASTRODUODENOSCOPY (EGD) WITH PROPOFOL;  Surgeon: Otis Brace, MD;  Location: [facility];  Service: Gastroenterology;  Laterality: N/A;  . RIGHT/LEFT HEART CATH AND CORONARY ANGIOGRAPHY N/A 02/11/2019   Procedure: RIGHT/LEFT HEART CATH AND CORONARY ANGIOGRAPHY;  Surgeon: Jolaine Artist, MD;  Location: [facility];  Service: Cardiovascular;  Laterality: N/A;  . VIDEO BRONCHOSCOPY N/A 02/01/2019   Procedure: VIDEO BRONCHOSCOPY WITHOUT FLUORO;  Surgeon: Candee Furbish, MD;  Location: [facility];  Service: Endoscopy;  Laterality: N/A;     Medications: Current Meds  Medication Sig  . Accu-Chek FastClix Lancets MISC USE AS DIRECTED TO TEST BLOOD SUGAR THREE TIMES DAILY  . allopurinol (ZYLOPRIM) 300 MG tablet Take 300 mg by mouth daily.  Marland Kitchen aspirin EC 81 MG tablet Take 81 mg by mouth daily.  Marland Kitchen atorvastatin (LIPITOR) 80 MG tablet Take 80 mg by mouth daily at 6 PM.  . Blood Glucose Monitoring Suppl (ACCU-CHEK GUIDE) w/Device KIT 1 each by Does not apply route 3 (three) times daily.  . carvedilol (COREG) 25 MG tablet Take 1 tablet (25 mg total) by mouth 2 (two) times daily with a meal.  . DULoxetine (CYMBALTA) 30 MG capsule Take 1 capsule (30 mg total) by mouth daily.  Marland Kitchen EASY COMFORT PEN NEEDLES 31G X 5 MM MISC USE FOUR TIMES PER DAY FOR INSULIN ADMINISTRATION  . furosemide (LASIX) 80 MG tablet Take 80 mg by mouth 2 (two) times daily.  Marland Kitchen gabapentin (NEURONTIN) 100 MG capsule Take 1 capsule (100 mg total) by mouth at bedtime.  Marland Kitchen glucose blood (ACCU-CHEK GUIDE) test strip USE AS DIRECTED TO TEST BLOOD SUGAR THREE TIMES DAILY  . insulin aspart (NOVOLOG) 100 UNIT/ML injection 0 to 12 units subcutaneously 3 times daily before meals as  per sliding scale  . Insulin Glargine (BASAGLAR KWIKPEN) 100 UNIT/ML SOPN Inject 0.15 mLs (15 Units total) into the skin at bedtime.  Elmore Guise Device MISC Use as instructed 3 times daily  . lansoprazole (PREVACID) 15 MG capsule Take 15 mg by mouth daily.   . metoCLOPramide (REGLAN) 5 MG tablet Take 1 tablet (5 mg total) by mouth 3 (three) times daily.  . Misc. Devices MISC Portable oxygen concentrator.  Diagnosis-chronic respiratory failure.  . Misc. Devices MISC [place] with seat. Dx: Congestive Heart Failure  . Misc. Devices MISC Scale; Dx - CHF  . Multiple Vitamins-Minerals (MULTIVITAMIN WITH MINERALS) tablet Take 1 tablet by mouth daily.  . polyethylene glycol (MIRALAX / GLYCOLAX) 17 g packet Take 17 g by mouth daily as needed for moderate constipation.  . sodium chloride (OCEAN) 0.65 % SOLN nasal spray Place 1 spray into both nostrils as needed for congestion.  . SUPER B COMPLEX/C  CAPS Take 1 capsule by mouth daily.     Allergies: Allergies  Allergen Reactions  . Garlic Shortness Of Breath, Itching and Swelling    Raw*  Hand itching and swelling  . Latex Itching  . Morphine And Related Itching and Other (See Comments)    Headache   . Other Itching    Reaction to newspaper ink -itching and headache    Social History: The patient  reports that she has never smoked. She has never used smokeless tobacco. She reports that she does not drink alcohol or use drugs.   Family History: The patient's family history includes Diabetes Mellitus II in her father; Healthy in her mother; Stroke in her father.   Review of Systems: Please see the history of present illness.   All other systems are reviewed and negative.   Physical Exam: VS:  BP 140/66   Pulse 66   Temp (!) 97.5 F (36.4 C)   Ht 4' 11"  (1.499 m)   Wt 170 lb 12.8 oz (77.5 kg)   SpO2 (!) 89%   BMI 34.50 kg/m  .  BMI Body mass index is 34.5 kg/m.  Wt Readings from Last 3 Encounters:  07/01/19 170 lb 12.8 oz (77.5 kg)    06/10/19 161 lb (73 kg)  05/22/19 154 lb 14.4 oz (70.3 kg)    General: Alert and in no acute distress.  She is short of breath. She is in a wheelchair. Her weight is up - like 16# since February - up 9 pounds since earlier this month. Her sats are around 89% on 3 liters. She has oxygen in place. She is shaking.  HEENT: Normal.  Neck: Supple, no JVD, carotid bruits, or masses noted.  Cardiac: Regular rate and rhythm. Heart tones are distant. Over 2+ edema bilaterally.  Respiratory:  Lungs with very diminished breath sounds - rales in the bases with increased work of breathing.  MS: No deformity or atrophy. Gait not tested.  Skin: Warm and dry. Color is normal.  Neuro:  Strength and sensation are intact and no gross focal deficits noted.  Psych: Alert, appropriate and with normal affect.   LABORATORY DATA:  EKG:  EKG is ordered today.  Personally reviewed by me. This demonstrates   Lab Results  Component Value Date   WBC 4.5 05/21/2019   HGB 8.4 (L) 05/21/2019   HCT 27.0 (L) 05/21/2019   PLT 212 05/21/2019   GLUCOSE 65 (L) 05/22/2019   CHOL 353 (H) 01/09/2019   TRIG 108 02/01/2019   HDL 52 01/09/2019   LDLCALC 251 (H) 01/09/2019   ALT 10 02/10/2019   AST 17 02/10/2019   NA 139 05/22/2019   K 4.1 05/22/2019   CL 100 05/22/2019   CREATININE 1.94 (H) 05/22/2019   BUN 55 (H) 05/22/2019   CO2 32 05/22/2019   TSH 3.480 02/27/2019   INR 1.1 02/09/2019   HGBA1C 7.4 (H) 04/18/2019     BNP (last 3 results) Recent Labs    04/19/19 0216 04/20/19 0218 05/18/19 0926  BNP 465.8* 290.1* 875.8*    ProBNP (last 3 results) No results for input(s): PROBNP in the last 8760 hours.   Other Studies Reviewed Today:  ECHO IMPRESSIONS 04/2019  1. Left ventricular ejection fraction, by visual estimation, is 40 to  45%. The left ventricle has mild to moderately decreased function. There  is mildly increased left ventricular wall thickness.  2. The left ventricle demonstrates  global hypokinesis.  3. Global right ventricle has  normal systolc function.The right  ventricular size is normal.  4. Aortic stenosis was incompletely evaluated but likely mild aortic  valve stenosis (MG 38mHg, AVA 1.3 cm^2, DI 0.45)  5. The inferior vena cava is normal in size with <50% respiratory  variability, suggesting right atrial pressure of 8 mmHg.    ASSESSMENT & PLAN:    1. Acute on chronic systolic HF - weight is up considerably - visably short of breath despite oxygen therapy - noted multiple admissions over the past year. Needs labs, IV diuretics, repeat CXR considering last month's findings and possible admission. Sending to ER for further evaluation. Sending by EMS. Further disposition to follow. May need to consider changing to [place]sem[place]. Unclear to me if there is something in her social history that is playing a role.   2. HIstory of [place] CM - see above.   3. HTN - BP fair.   4. HLD - on therapy  5. Mild AS - most recent echo noted  6. Numerous admissions - unclear her support system/social system - probably needs social worker to see. Needs to get scales at her house.   7. Anemia - suspect this is playing a role as well.   8. CKD  9. COVID-19 Education: The signs and symptoms of COVID-19 were discussed with the patient and how to seek care for testing (follow up with PCP or arrange E-visit).  The importance of social distancing, staying at home, hand hygiene and wearing a mask when out in public were discussed today.  Current medicines are reviewed with the patient today.  The patient does not have concerns regarding medicines other than what has been noted above.  The following changes have been made:  See above.  Labs/ tests ordered today include:    Orders Placed This Encounter  Procedures  . EKG 12-Lead     Disposition:   Further disposition pending. Transferred by EMS to ER. [facility]    Patient is agreeable to this plan and will call if any problems develop in the interim.   Signed:Truitt Merle NP  07/01/2019 11:06 AM  CL[place] RoadSHiddeniteGGrayville [place]  [id number]: [phone]: [phone]

## 2019-06-26 NOTE — Telephone Encounter (Signed)
I received the message from [facility] and have addressed the concerns. Thanks

## 2019-06-26 NOTE — Telephone Encounter (Signed)
Call received from [name], EMT.  She met with the patient today and noted that the patient has been out of meds.  She called [facility] for refills and will fill medication box when meds are received. Heather called and re-scheduled cardiology appointment for Monday 07/01/2019.  She noted that the patient said that she takes [place].   She also noted that the patient needs a new referral for neurology. Heather left a message with medication list and upcoming appointments for patient and her daughter, [name].   This CM to contact jessica to remind her of appointments and need for transportation for her mother.

## 2019-06-27 NOTE — Telephone Encounter (Signed)
Call placed to patient's daughter, [name] and informed her of the upcoming appointments for her mother that [place], EMT scheduled. [name] will need to arrange transportation for her mother.  Reminded her that she can call medicaid to register her mother for transportation services and a SCAT application can be completed.  She would not have to arrange [place] [employer] or Lyft services.  Appointment dates/times and addresses provided:  07/01/2019 @ 1015 - cardiology 07/03/2019 @ 1100 - neurology 07/10/2019 @ 1410 - Dr Margarita Rana  08/13/2019  @ 0844 - [facility]

## 2019-07-01 ENCOUNTER — Encounter (HOSPITAL_COMMUNITY): Payer: Self-pay

## 2019-07-01 ENCOUNTER — Emergency Department (HOSPITAL_COMMUNITY)
Admission: EM | Admit: 2019-07-01 | Discharge: 2019-07-01 | Disposition: A | Discharge status: Home / Self Care | Payer: Medicare Other | Attending: Emergency Medicine | Admitting: Emergency Medicine

## 2019-07-01 ENCOUNTER — Encounter (INDEPENDENT_AMBULATORY_CARE_PROVIDER_SITE_OTHER): Payer: Self-pay

## 2019-07-01 ENCOUNTER — Ambulatory Visit (INDEPENDENT_AMBULATORY_CARE_PROVIDER_SITE_OTHER): Payer: Medicare Other | Admitting: Nurse Practitioner

## 2019-07-01 ENCOUNTER — Other Ambulatory Visit: Payer: Self-pay

## 2019-07-01 ENCOUNTER — Emergency Department (HOSPITAL_COMMUNITY): Payer: Medicare Other

## 2019-07-01 ENCOUNTER — Encounter: Payer: Self-pay | Admitting: Nurse Practitioner

## 2019-07-01 VITALS — BP 140/66 | HR 66 | Temp 97.5°F | Ht 59.0 in | Wt 170.8 lb

## 2019-07-01 DIAGNOSIS — R609 Edema, unspecified: Secondary | ICD-10-CM | POA: Diagnosis not present

## 2019-07-01 DIAGNOSIS — Z209 Contact with and (suspected) exposure to unspecified communicable disease: Secondary | ICD-10-CM | POA: Diagnosis not present

## 2019-07-01 DIAGNOSIS — I5181 Takotsubo syndrome: Secondary | ICD-10-CM | POA: Diagnosis not present

## 2019-07-01 DIAGNOSIS — Z8616 Personal history of COVID-19: Secondary | ICD-10-CM | POA: Diagnosis not present

## 2019-07-01 DIAGNOSIS — Z20822 Contact with and (suspected) exposure to covid-19: Secondary | ICD-10-CM | POA: Diagnosis not present

## 2019-07-01 DIAGNOSIS — G4489 Other headache syndrome: Secondary | ICD-10-CM | POA: Diagnosis not present

## 2019-07-01 DIAGNOSIS — I10 Essential (primary) hypertension: Secondary | ICD-10-CM | POA: Diagnosis not present

## 2019-07-01 DIAGNOSIS — E1122 Type 2 diabetes mellitus with diabetic chronic kidney disease: Secondary | ICD-10-CM | POA: Insufficient documentation

## 2019-07-01 DIAGNOSIS — R0602 Shortness of breath: Secondary | ICD-10-CM | POA: Diagnosis not present

## 2019-07-01 DIAGNOSIS — Z794 Long term (current) use of insulin: Secondary | ICD-10-CM | POA: Insufficient documentation

## 2019-07-01 DIAGNOSIS — E7800 Pure hypercholesterolemia, unspecified: Secondary | ICD-10-CM | POA: Diagnosis not present

## 2019-07-01 DIAGNOSIS — R079 Chest pain, unspecified: Secondary | ICD-10-CM | POA: Diagnosis not present

## 2019-07-01 DIAGNOSIS — I5023 Acute on chronic systolic (congestive) heart failure: Secondary | ICD-10-CM | POA: Insufficient documentation

## 2019-07-01 DIAGNOSIS — N183 Chronic kidney disease, stage 3 unspecified: Secondary | ICD-10-CM | POA: Insufficient documentation

## 2019-07-01 DIAGNOSIS — I130 Hypertensive heart and chronic kidney disease with heart failure and stage 1 through stage 4 chronic kidney disease, or unspecified chronic kidney disease: Secondary | ICD-10-CM | POA: Diagnosis not present

## 2019-07-01 DIAGNOSIS — E1165 Type 2 diabetes mellitus with hyperglycemia: Secondary | ICD-10-CM | POA: Diagnosis not present

## 2019-07-01 DIAGNOSIS — I252 Old myocardial infarction: Secondary | ICD-10-CM | POA: Insufficient documentation

## 2019-07-01 DIAGNOSIS — Z79899 Other long term (current) drug therapy: Secondary | ICD-10-CM | POA: Diagnosis not present

## 2019-07-01 DIAGNOSIS — I110 Hypertensive heart disease with heart failure: Secondary | ICD-10-CM | POA: Diagnosis not present

## 2019-07-01 DIAGNOSIS — I5021 Acute systolic (congestive) heart failure: Secondary | ICD-10-CM

## 2019-07-01 DIAGNOSIS — R0789 Other chest pain: Secondary | ICD-10-CM | POA: Diagnosis not present

## 2019-07-01 LAB — BASIC METABOLIC PANEL
Anion gap: 10 (ref 5–15)
BUN: 42 mg/dL — ABNORMAL HIGH (ref 8–23)
CO2: 26 mmol/L (ref 22–32)
Calcium: 9.2 mg/dL (ref 8.9–10.3)
Chloride: 102 mmol/L (ref 98–111)
Creatinine, Ser: 1.77 mg/dL — ABNORMAL HIGH (ref 0.44–1.00)
GFR calc Af Amer: 35 mL/min — ABNORMAL LOW (ref >60)
GFR calc non Af Amer: 30 mL/min — ABNORMAL LOW (ref >60)
Glucose, Bld: 234 mg/dL — ABNORMAL HIGH (ref 70–99)
Potassium: 3.8 mmol/L (ref 3.5–5.1)
Sodium: 138 mmol/L (ref 135–145)

## 2019-07-01 LAB — CBC WITH DIFFERENTIAL/PLATELET
Abs Immature Granulocytes: 0.02 10*3/uL (ref 0.00–0.07)
Basophils Absolute: 0 10*3/uL (ref 0.0–0.1)
Basophils Relative: 0 %
Eosinophils Absolute: 0.1 10*3/uL (ref 0.0–0.5)
Eosinophils Relative: 1 %
HCT: 31.1 % — ABNORMAL LOW (ref 36.0–46.0)
Hemoglobin: 9.6 g/dL — ABNORMAL LOW (ref 12.0–15.0)
Immature Granulocytes: 0 %
Lymphocytes Relative: 12 %
Lymphs Abs: 0.7 10*3/uL (ref 0.7–4.0)
MCH: 28.2 pg (ref 26.0–34.0)
MCHC: 30.9 g/dL (ref 30.0–36.0)
MCV: 91.2 fL (ref 80.0–100.0)
Monocytes Absolute: 0.4 10*3/uL (ref 0.1–1.0)
Monocytes Relative: 6 %
Neutro Abs: 4.5 10*3/uL (ref 1.7–7.7)
Neutrophils Relative %: 81 %
Platelets: 224 10*3/uL (ref 150–400)
RBC: 3.41 MIL/uL — ABNORMAL LOW (ref 3.87–5.11)
RDW: 15.2 % (ref 11.5–15.5)
WBC: 5.7 10*3/uL (ref 4.0–10.5)
nRBC: 0 % (ref 0.0–0.2)

## 2019-07-01 LAB — POCT I-STAT EG7
Acid-Base Excess: 2 mmol/L (ref 0.0–2.0)
Bicarbonate: 27.8 mmol/L (ref 20.0–28.0)
Calcium, Ion: 1.23 mmol/L (ref 1.15–1.40)
HCT: 30 % — ABNORMAL LOW (ref 36.0–46.0)
Hemoglobin: 10.2 g/dL — ABNORMAL LOW (ref 12.0–15.0)
O2 Saturation: 85 %
Potassium: 3.8 mmol/L (ref 3.5–5.1)
Sodium: 139 mmol/L (ref 135–145)
TCO2: 29 mmol/L (ref 22–32)
pCO2, Ven: 46.8 mmHg (ref 44.0–60.0)
pH, Ven: 7.381 (ref 7.250–7.430)
pO2, Ven: 52 mmHg — ABNORMAL HIGH (ref 32.0–45.0)

## 2019-07-01 LAB — SARS CORONAVIRUS 2 (TAT 6-24 HRS): SARS Coronavirus 2: NEGATIVE

## 2019-07-01 LAB — TROPONIN I (HIGH SENSITIVITY): Troponin I (High Sensitivity): 13 ng/L (ref <18)

## 2019-07-01 LAB — BRAIN NATRIURETIC PEPTIDE: B Natriuretic Peptide: 1267.1 pg/mL — ABNORMAL HIGH (ref 0.0–100.0)

## 2019-07-01 MED ORDER — FUROSEMIDE 10 MG/ML IJ SOLN
100.0000 mg | Freq: Once | INTRAVENOUS | Status: AC
  Administered 2019-07-01: 100 mg via INTRAVENOUS
  Filled 2019-07-01: qty 10

## 2019-07-01 NOTE — ED Provider Notes (Signed)
[facility] EMERGENCY DEPARTMENT Provider Note   CSN: [id number] Arrival date & time: 07/01/19  1218     History Chief Complaint  Patient presents with  . Shortness of Breath    Kara Hanson is a 65 y.o. female.  64 yo F with a chief complaint of shortness of breath. Worsening over the course of 3 weeks.  Having some significant lower extremity edema.  Has been taking her medicines as prescribed.  Was seen in the cardiologist office today and noted to have an oxygen saturation in the 70s on her home 3 L of oxygen while exerting herself.  Sent to the ED for admission.  The history is provided by the patient.  Shortness of Breath Severity:  Moderate Onset quality:  Gradual Duration:  3 weeks Timing:  Constant Progression:  Worsening Chronicity:  New Context: activity   Relieved by:  Nothing Worsened by:  Nothing Ineffective treatments:  None tried Associated symptoms: no chest pain, no fever, no headaches, no vomiting and no wheezing        Past Medical History:  Diagnosis Date  . Acute CHF (congestive heart failure) ([place]) 09/24/2018  . Acute on chronic combined systolic and diastolic CHF (congestive heart failure) ([place]) 11/27/2017  . Acute on chronic respiratory failure with hypoxia ([place]) 03/20/2018  . Acute renal failure superimposed on stage 3 chronic kidney disease ([place])   . Acute respiratory distress 11/27/2017  . Acute respiratory failure with hypoxia ([place]) 02/09/2019  . Acute systolic CHF (congestive heart failure) ([place]) 05/19/2019  . AKI (acute kidney injury) ([place]) 11/27/2017  . Atelectasis   . Bulging lumbar disc 11/14/2017  . CAP (community acquired pneumonia) 10/31/2018  . Chest pain 06/17/2016  . CHF (congestive heart failure) ([place])   . Chronic diastolic CHF (congestive heart failure) ([place]) 07/20/2018  . Chronic respiratory failure ([place])   . CKD (chronic kidney disease), stage III 03/20/2018  . Controlled type 2 diabetes mellitus with  hyperglycemia ([place]) 04/23/2016  . Controlled type 2 diabetes with neuropathy ([place]) 03/20/2018  . COVID-19 virus infection 07/20/2018  . CVA (cerebral vascular accident) ([place])   . Diabetes mellitus without complication ([place])   . Dyspnea   . Elevated troponin 04/18/2019  . Epigastric pain   . Gout 11/14/2017  . Hypercholesteremia   . Hyperlipidemia LDL goal <70 09/30/2016  . Hypertension   . Myocardial infarction ([place])   . Normochromic normocytic anemia 04/23/2016  . NSTEMI (non-ST elevated myocardial infarction) ([place]) 02/09/2019  . Pneumonia 11/01/2018  . Pneumonia due to COVID-19 virus 07/20/2018  . S/P thoracentesis   . SIRS (systemic inflammatory response syndrome) ([place]) 07/25/2018  . Spinal stenosis   . Stress-induced cardiomyopathy 05/19/2016  . Syncope 04/23/2016  . Type 2 diabetes mellitus with diabetic neuropathy, unspecified ([place]) 05/06/2016  . Vertigo 05/06/2016  . Vitamin D deficiency 04/02/2018    Patient Active Problem List   Diagnosis Date Noted  . Acute systolic CHF (congestive heart failure) ([place]) 05/19/2019  . Acute exacerbation of CHF (congestive heart failure) ([place]) 04/18/2019  . Elevated troponin 04/18/2019  . Acute respiratory failure with hypoxia ([place]) 02/09/2019  . NSTEMI (non-ST elevated myocardial infarction) ([place]) 02/09/2019  . S/P thoracentesis   . Atelectasis   . Chronic respiratory failure ([place])   . Acute renal failure superimposed on stage 3 chronic kidney disease ([place])   . Epigastric pain   . CHF (congestive heart failure) ([place]) 01/23/2019  . CAP (community acquired pneumonia) 10/31/2018  . Acute CHF (congestive heart  failure) ([place]) 09/24/2018  . SIRS (systemic inflammatory response syndrome) ([place]) 07/25/2018  . COVID-19 virus infection 07/20/2018  . Chronic diastolic CHF (congestive heart failure) ([place]) 07/20/2018  . Pneumonia due to COVID-19 virus 07/20/2018  . Vitamin D deficiency 04/02/2018  . Spinal stenosis 03/29/2018  . Acute on chronic  respiratory failure with hypoxia ([place]) 03/20/2018  . Controlled type 2 diabetes with neuropathy ([place]) 03/20/2018  . CKD (chronic kidney disease), stage III 03/20/2018  . Acute on chronic combined systolic and diastolic CHF (congestive heart failure) ([place]) 11/27/2017  . AKI (acute kidney injury) ([place]) 11/27/2017  . Acute respiratory distress 11/27/2017  . Bulging lumbar disc 11/14/2017  . Gout 11/14/2017  . Hyperlipidemia LDL goal <70 09/30/2016  . Chest pain 06/17/2016  . Stress-induced cardiomyopathy 05/19/2016  . Type 2 diabetes mellitus with diabetic neuropathy, unspecified ([place]) 05/06/2016  . Vertigo 05/06/2016  . Controlled type 2 diabetes mellitus with hyperglycemia ([place]) 04/23/2016  . Hypertension 04/23/2016  . Normochromic normocytic anemia 04/23/2016  . Syncope 04/23/2016    Past Surgical History:  Procedure Laterality Date  . BIOPSY  01/27/2019   Procedure: BIOPSY;  Surgeon: Otis Brace, MD;  Location: [facility];  Service: Gastroenterology;;  . BLADDER SURGERY    . CARDIAC CATHETERIZATION N/A 04/25/2016   Procedure: Left Heart Cath and Coronary Angiography;  Surgeon: Lorretta Harp, MD;  Location: [place] [facility];  Service: Cardiovascular;  Laterality: N/A;  . CARDIAC CATHETERIZATION  02/11/2019  . CESAREAN SECTION    . CHOLECYSTECTOMY    . ESOPHAGOGASTRODUODENOSCOPY (EGD) WITH PROPOFOL N/A 01/27/2019   Procedure: ESOPHAGOGASTRODUODENOSCOPY (EGD) WITH PROPOFOL;  Surgeon: Otis Brace, MD;  Location: [facility];  Service: Gastroenterology;  Laterality: N/A;  . RIGHT/LEFT HEART CATH AND CORONARY ANGIOGRAPHY N/A 02/11/2019   Procedure: RIGHT/LEFT HEART CATH AND CORONARY ANGIOGRAPHY;  Surgeon: Jolaine Artist, MD;  Location: [place] [facility];  Service: Cardiovascular;  Laterality: N/A;  . VIDEO BRONCHOSCOPY N/A 02/01/2019   Procedure: VIDEO BRONCHOSCOPY WITHOUT FLUORO;  Surgeon: Candee Furbish, MD;  Location: [facility];  Service: Endoscopy;   Laterality: N/A;     OB History   No obstetric history on file.     Family History  Problem Relation Age of Onset  . Diabetes Mellitus II Father   . Stroke Father   . Healthy Mother        She is 27 years old.     Social History   Tobacco Use  . Smoking status: Never Smoker  . Smokeless tobacco: Never Used  Substance Use Topics  . Alcohol use: No  . Drug use: No    Home Medications Prior to Admission medications   Medication Sig Start Date End Date Taking? Authorizing Provider  allopurinol (ZYLOPRIM) 300 MG tablet Take 300 mg by mouth daily.   Yes [provider]  aspirin EC 81 MG tablet Take 81 mg by mouth daily.   Yes [provider]  atorvastatin (LIPITOR) 80 MG tablet Take 80 mg by mouth daily at 6 PM.   Yes [provider]  carvedilol (COREG) 25 MG tablet Take 1 tablet (25 mg total) by mouth 2 (two) times daily with a meal. 02/27/19  Yes Newlin, Enobong, MD  DULoxetine (CYMBALTA) 30 MG capsule Take 1 capsule (30 mg total) by mouth daily. 02/06/19  Yes Dana Allan I, MD  ergocalciferol (VITAMIN D2) 1.25 MG (50000 UT) capsule Take 1 capsule by mouth once a week. 06/26/19  Yes [provider]  furosemide (LASIX)  80 MG tablet Take 80 mg by mouth 2 (two) times daily.   Yes [provider]  gabapentin (NEURONTIN) 100 MG capsule Take 1 capsule (100 mg total) by mouth at bedtime. 05/22/19  Yes Eulogio Bear U, DO  insulin aspart (NOVOLOG) 100 UNIT/ML injection 0 to 12 units subcutaneously 3 times daily before meals as per sliding scale 02/27/19  Yes Newlin, Enobong, MD  Insulin Glargine (BASAGLAR KWIKPEN) 100 UNIT/ML SOPN Inject 0.15 mLs (15 Units total) into the skin at bedtime. 05/22/19  Yes Eulogio Bear U, DO  lansoprazole (PREVACID) 15 MG capsule Take 15 mg by mouth daily.  10/02/18  Yes [provider]  metoCLOPramide (REGLAN) 5 MG tablet Take 1 tablet (5 mg total) by mouth 3 (three) times daily. 02/05/19 02/05/20 Yes  Bonnell Public, MD  Multiple Vitamins-Minerals (MULTIVITAMIN WITH MINERALS) tablet Take 1 tablet by mouth daily.   Yes [provider]  polyethylene glycol (MIRALAX / GLYCOLAX) 17 g packet Take 17 g by mouth daily as needed for moderate constipation. 05/22/19  Yes Geradine Girt, DO  sodium chloride (OCEAN) 0.65 % SOLN nasal spray Place 1 spray into both nostrils as needed for congestion.   Yes [provider]  SUPER B COMPLEX/C CAPS Take 1 capsule by mouth daily.   Yes [provider]  Accu-Chek FastClix Lancets MISC USE AS DIRECTED TO TEST BLOOD SUGAR THREE TIMES DAILY 06/10/19   [name], MD  Blood Glucose Monitoring Suppl (ACCU-CHEK GUIDE) w/Device KIT 1 each by Does not apply route 3 (three) times daily. 06/10/19   [name], MD  EASY COMFORT PEN NEEDLES 31G X 5 MM MISC USE FOUR TIMES PER DAY FOR INSULIN ADMINISTRATION 10/02/18   [name], MD  glucose blood (ACCU-CHEK GUIDE) test strip USE AS DIRECTED TO TEST BLOOD SUGAR THREE TIMES DAILY 06/10/19   [name], MD  Lancet Device MISC Use as instructed 3 times daily 04/26/19   [name], MD  Misc. Devices MISC Portable oxygen concentrator.  Diagnosis-chronic respiratory failure. 09/03/18   [name], MD  Misc. Devices MISC Rollaor with seat. Dx: Congestive Heart Failure 10/19/18   [name], MD  Misc. Devices MISC Scale; Dx - CHF 06/10/19   [name], MD    Allergies    Garlic, Latex, Morphine and related, and Other  Review of Systems   Review of Systems  Constitutional: Negative for chills and fever.  HENT: Negative for congestion and rhinorrhea.   Eyes: Negative for redness and visual disturbance.  Respiratory: Positive for shortness of breath. Negative for wheezing.   Cardiovascular: Negative for chest pain and palpitations.  Gastrointestinal: Negative for nausea and vomiting.  Genitourinary: Negative for dysuria and urgency.  Musculoskeletal: Negative for  arthralgias and myalgias.  Skin: Negative for pallor and wound.  Neurological: Negative for dizziness and headaches.    Physical Exam Updated Vital Signs BP (!) 150/84   Pulse (!) 59   Temp 97.9 F (36.6 C) (Oral)   Resp 18   SpO2 100%   Physical Exam Vitals and nursing note reviewed.  Constitutional:      General: She is not in acute distress.    Appearance: She is well-developed. She is not diaphoretic.  HENT:     Head: Normocephalic and atraumatic.  Eyes:     Pupils: Pupils are equal, round, and reactive to light.  Neck:     Vascular: JVD (mid neck) present.  Cardiovascular:     Rate and Rhythm: Normal rate and  regular rhythm.     Heart sounds: No murmur. No friction rub. No gallop.   Pulmonary:     Effort: Pulmonary effort is normal.     Breath sounds: No wheezing or rales.  Abdominal:     General: There is no distension.     Palpations: Abdomen is soft.     Tenderness: There is no abdominal tenderness.  Musculoskeletal:        General: No tenderness.     Cervical back: Normal range of motion and neck supple.     Right lower leg: Edema present.     Left lower leg: Edema present.     Comments: 4+ edema to bilateral lower extremities up to thighs  Skin:    General: Skin is warm and dry.  Neurological:     Mental Status: She is alert and oriented to person, place, and time.  Psychiatric:        Behavior: Behavior normal.     ED Results / Procedures / Treatments   Labs (all labs ordered are listed, but only abnormal results are displayed) Labs Reviewed  CBC WITH DIFFERENTIAL/PLATELET - Abnormal; Notable for the following components:      Result Value   RBC 3.41 (*)    Hemoglobin 9.6 (*)    HCT 31.1 (*)    All other components within normal limits  BASIC METABOLIC PANEL - Abnormal; Notable for the following components:   Glucose, Bld 234 (*)    BUN 42 (*)    Creatinine, Ser 1.77 (*)    GFR calc non Af Amer 30 (*)    GFR calc Af Amer 35 (*)    All other  components within normal limits  BRAIN NATRIURETIC PEPTIDE - Abnormal; Notable for the following components:   B Natriuretic Peptide 1,267.1 (*)    All other components within normal limits  POCT I-STAT EG7 - Abnormal; Notable for the following components:   pO2, Ven 52.0 (*)    HCT 30.0 (*)    Hemoglobin 10.2 (*)    All other components within normal limits  SARS CORONAVIRUS 2 (TAT 6-24 HRS)  TROPONIN I (HIGH SENSITIVITY)    EKG EKG Interpretation  Date/Time:  Monday July 01 2019 12:27:40 EDT Ventricular Rate:  68 PR Interval:    QRS Duration: 89 QT Interval:  428 QTC Calculation: 456 R Axis:   50 Text Interpretation: Atrial fibrillation Abnormal T, consider ischemia, diffuse leads flipped t waves in inferior and lateral leads seen on prior No significant change since last tracing Confirmed by Deno Etienne [phone]) on 07/01/2019 12:55:37 PM   Radiology DG Chest Port 1 View  Result Date: 07/01/2019 CLINICAL DATA:  Shortness of breath EXAM: PORTABLE CHEST 1 VIEW COMPARISON:  05/18/2019 FINDINGS: Probable mild pulmonary edema. Bilateral pleural effusions and bibasilar atelectasis. No pneumothorax. Stable cardiomegaly. IMPRESSION: Probable mild pulmonary edema with bilateral pleural effusions and bibasilar atelectasis. Electronically Signed   By: Macy Mis M.D.   On: 07/01/2019 13:07    Procedures Procedures (including critical care time)  Medications Ordered in ED Medications  furosemide (LASIX) 100 mg in dextrose 5 % 50 mL IVPB (100 mg Intravenous New Bag/Given 07/01/19 1433)    ED Course  I have reviewed the triage vital signs and the nursing notes.  Pertinent labs & imaging results that were available during my care of the patient were reviewed by me and considered in my medical decision making (see chart for details).    MDM Rules/Calculators/A&P  64 yo F with a chief complaint of shortness of breath.  Worse on exertion.  Going on for about 3  weeks.  Saw her cardiologist in the office to felt she need to come to the ED for admission.  Patient was seen by the heart failure at home program and felt that she might meet criteria.  Plan to get this set up.  The patients results and plan were reviewed and discussed.   Any x-rays performed were independently reviewed by myself.   Differential diagnosis were considered with the presenting HPI.  Medications  furosemide (LASIX) 100 mg in dextrose 5 % 50 mL IVPB (100 mg Intravenous New Bag/Given 07/01/19 1433)    Vitals:   07/01/19 1400 07/01/19 1415 07/01/19 1440 07/01/19 1500  BP: (!) 163/100 (!) 154/80 (!) 172/74 (!) 150/84  Pulse: 61 69 (!) 58 (!) 59  Resp: 16 (!) 26 17 18   Temp:      TempSrc:      SpO2: 100% 91% 100% 100%    Final diagnoses:  Acute on chronic systolic congestive heart failure (HCC)      Final Clinical Impression(s) / ED Diagnoses Final diagnoses:  Acute on chronic systolic congestive heart failure [facility])    Rx / DC Orders ED Discharge Orders    None       Deno Etienne, DO 07/01/19 1544

## 2019-07-01 NOTE — ED Notes (Signed)
Pt voided 650 ml. Provider notified

## 2019-07-01 NOTE — Patient Instructions (Addendum)
After Visit Summary:  We are sending you to the ER today.   Call the [place] office at [phone] if you have any questions, problems or concerns.

## 2019-07-01 NOTE — Plan of Care (Signed)
[facility] at [facility]  Consult Note  Chief Complaint: DOE and LE swelling   History of Present Illness: Kara Hanson is a 64 year-old female with a medical history of non-ischemic cardiomyopathy with EF 40%, well-controlled T2DM, HTN, HLD, and cervical spine stenosis who presents with a 1-week history of dyspnea on exertion and shortness of breath. Patient was in her usual state of health until about 2 weeks ago when she noticed she was experiencing significant dyspnea on exertion at home while performing ADLs. She also noticed increased lower extremity swelling up to her abdomen which is not normal for her. She is on Lasix 80 mg BID which she is compliant with, but has not noticed significant urine output for the past 2 weeks. She reports 3-4-pillow orthopnea which is unchanged from baseline. She denies active chest pain and PND. She uses 4L of supplemental O2 at baseline. Has not had to increase during the past 2 weeks. She does not have a scale at home and therefore cannot check her weights. From review of chart, last discharge summary states her weights was 70 kg on day of discharge. She is 78 kg today.  She follows up with cardiology (Dr. Marlou Porch). She was seen by cardiology NP this morning and sent to the ED for admission for IV diuresis. She lives at home with her daughter and grandson (88yo) who are very supportive and involve in her care. She receives her medications through pharmacy delivery.   In the ED patient was hemodynamically stable. Blood work showed Cr 1.77 (around baseline), BNP 1,267, and negative troponin. CXR showed bilateral pulmonary edema with effusions. EKG without acute ischemia or new arrhythmias. IV Lasix 100 mg given x 1.   Personally reviewed last cardiac studies. Last echocardiogram in 04/2019 showed EF 40% with global hypokinesis, mild aortic stenosis (though valve not completely evaluated). R/LHC in 02/2019 showed mild, non-obstructive CAD with small coronary arteries and  medical therapy was recommended.   Meds:  Current Meds  Medication Sig  . allopurinol (ZYLOPRIM) 300 MG tablet Take 300 mg by mouth daily.  Marland Kitchen aspirin EC 81 MG tablet Take 81 mg by mouth daily.  Marland Kitchen atorvastatin (LIPITOR) 80 MG tablet Take 80 mg by mouth daily at 6 PM.  . carvedilol (COREG) 25 MG tablet Take 1 tablet (25 mg total) by mouth 2 (two) times daily with a meal.  . DULoxetine (CYMBALTA) 30 MG capsule Take 1 capsule (30 mg total) by mouth daily.  . ergocalciferol (VITAMIN D2) 1.25 MG (50000 UT) capsule Take 1 capsule by mouth once a week.  . furosemide (LASIX) 80 MG tablet Take 80 mg by mouth 2 (two) times daily.  Marland Kitchen gabapentin (NEURONTIN) 100 MG capsule Take 1 capsule (100 mg total) by mouth at bedtime.  . insulin aspart (NOVOLOG) 100 UNIT/ML injection 0 to 12 units subcutaneously 3 times daily before meals as per sliding scale  . Insulin Glargine (BASAGLAR KWIKPEN) 100 UNIT/ML SOPN Inject 0.15 mLs (15 Units total) into the skin at bedtime.  . lansoprazole (PREVACID) 15 MG capsule Take 15 mg by mouth daily.   . metoCLOPramide (REGLAN) 5 MG tablet Take 1 tablet (5 mg total) by mouth 3 (three) times daily.  . Multiple Vitamins-Minerals (MULTIVITAMIN WITH MINERALS) tablet Take 1 tablet by mouth daily.  . polyethylene glycol (MIRALAX / GLYCOLAX) 17 g packet Take 17 g by mouth daily as needed for moderate constipation.  . sodium chloride (OCEAN) 0.65 % SOLN nasal spray Place 1 spray into both nostrils  as needed for congestion.  . SUPER B COMPLEX/C CAPS Take 1 capsule by mouth daily.    Physical Exam: Blood pressure (!) 150/84, pulse (!) 59, temperature 97.9 F (36.6 C), temperature source Oral, resp. rate 18, SpO2 100 %.  General: chronically ill appearing female, sleeping in bed in no acute distress  Cardiac: regular rate and rhythm, nl S1/S2, no murmurs, rubs or gallops, JVD up to ear  Pulm: breath sounds absent at bases, crackles at upper and mid lung fields, no increased work of  breathing at rest on 4L of O2 Abd: soft, NTND, bowel sounds are normoactive, abdominal wall edema present  Ext: warm and well perfused,  3+ pitting edema on bilateral LE up to abdomen   Clinical Screening: (ALL ANSWERS MUST BE NO)  Based on current presentation is the patient likely to require: advanced diagnostics, advanced imaging (CT, MRI, nuclear stress testing), cardiac catheterization, EGD/colonoscopy, or lab monitoring not amendable to home monitoring (troponin, >q12 hour labs): NO.  Based on current presentation is the patient is likely to require: mechanical ventilation (invasive and noninvasive, history of intubation) and/or vasoactive medications: NO.  Based on current presentation is the patient likely to require a surgical or IR procedure including but not limited to intraabdominal abscess drainage, percutaneous nephrostomy tube placement, thoracentesis for parapneumonic effusion, surgical wound debridement: NO.  Based on current presentation is the patient is likely to require: daily specialty consultation, blood transfusions, respiratory isolation/airborne precautions, active adjustments of opiates or IV pain medications: NO.  Does the patient have barriers that would make it unsafe to provide care in the home including but not limited to severe AMS, active substance use disorder, history of or high risk of noncompliance with primary treatment plan: NO.  Has the patient ever been intubated or do they have a new tracheostomy: NO.  Does the patient have an unstable arrhythmia: NO.  Is hemodialysis likely to be required (i.e. already on HD or newly anuric/sever ATN): NO.  Is there risk for inability to obtain IV access: NO.  Social Screening: (ALL QUESTIONS MUST BE YES) Does the patient have a home recovery environment? YES.  Is the patient's home recovery environment in an eligible geography [facility])? YES.  Does the patient's home have water, electricity, bathroom,  heat/ac, refrigerator? YES.  Does the patient feel safe at home? YES.  Are family/caregivers willing to participate, as needed, while the patient participates [facility].  Is there a person in the home (patient or other) willing/able to take vital signs and answer phone calls? YES.  Is the patient willing to put pets in a secure area while [facility] and affiliated staff are in the home? YES.  Is patient willing/able to participate in the [facility] (this includes [facility] affiliated staff entering the home, and associated services)? YES.  Is the patient/patient's HCP willing/able to sign consent? YES.  Assessment / Plan:  Based on the HPI and information obtained the patient is a candidate for the [facility]. Consent has been signed and the patient has been provided with a copy.   [facility] has been notified and will present to the patient's house at tomorrow AM.   Home health / DME needs: Home health PT   Medication needs: None, consider switching to torsemide when transitioning to PO diuretics   Other needs: None   Patient's contact information: Phoebie Shad  Phone: [phone] Address: [address] [place],  [place] [address]  Please do not hesitate to call with questions/concerns.   Welford Roche, MD 07/01/2019, 3:15 PM  Pager: [phone]

## 2019-07-01 NOTE — Evaluation (Signed)
Physical Therapy Evaluation Patient Details Name: Kara Hanson MRN: [id number] DOB: 1955-12-23 Today's Date: 07/01/2019   History of Present Illness  Pt admit to ED due to CHF.    Clinical Impression  Pt admitted with above diagnosis. Pt was able to ambulate with RW on unit with Modif I with RW. Has a RW per pt.  Pt already has O2 at home as well.  Updated MD regarding recommendations for HHPT f/u.   Pt currently with functional limitations due to the deficits listed below (see PT Problem List). Pt will benefit from skilled PT to increase their independence and safety with mobility to allow discharge to the venue listed below.    Home support: Does the patient have a caregiver? 24 hour care between family and friends Is the caregiver physically and cognitively able to and willing to assist in the patient's care at home? Yes Are there aspects of care that the caregiver is unable or willing to assist with?  No  Prior Functional Level: Has there been a change from their baseline level of function with ADLs and IADLs? No If yes, describe the change in functional status.   Does the change in functional status limit the patient from returning home for their care?No  Pt still does ADLs but has to rest a lot due to breathing  Falls:   Has the patient had any falls with injury in the last 6 months?  No If yes, how many and what was the cause of the fall(s)?N/A  Transportation: Does the patient drive? No Is a caregiver able to pick up needed supplies (groceries, medications, etc.)? Yes, pharmacy delivers pts meds If both answers above are "no" has the patient been using delivery services to get their needed supplies?   How does the patient get to medical appointments? Daughter or friends     Follow Up Recommendations Home health PT;Supervision/Assistance - 24 hour    Equipment Recommendations  None recommended by PT    Recommendations for Other Services       Precautions /  Restrictions Precautions Precautions: Fall Restrictions Weight Bearing Restrictions: No      Mobility  Bed Mobility Overal bed mobility: Independent                Transfers Overall transfer level: Independent                  Ambulation/Gait Ambulation/Gait assistance: Modified independent (Device/Increase time) Gait Distance (Feet): 200 Feet Assistive device: Rolling walker (2 wheeled) Gait Pattern/deviations: Step-through pattern;Decreased stride length   Gait velocity interpretation: 1.31 - 2.62 ft/sec, indicative of limited community ambulator General Gait Details: Pt was able to ambulate with RW with Modif I and no need for cues or assist.  No LOB as well. Pt needed cues for pursed lip breathihng wiuth DOE 3/4 by end of walk and ssats on 4L down to 70%.     Stairs            Wheelchair Mobility    Modified Rankin (Stroke Patients Only)       Balance Overall balance assessment: Needs assistance Sitting-balance support: No upper extremity supported;Feet supported Sitting balance-Leahy Scale: Fair     Standing balance support: Bilateral upper extremity supported;During functional activity Standing balance-Leahy Scale: Fair Standing balance comment: can stand statically without assist                             Pertinent Vitals/Pain  Pain Assessment: No/denies pain    Home Living Family/patient expects to be discharged to:: Private residence Living Arrangements: Children;Other relatives(grandson) Available Help at Discharge: Family;Available 24 hours/day Type of Home: House Home Access: Level entry Entrance Stairs-Rails: Right   Home Layout: One level Home Equipment: Walker - 2 wheels(4L O2)      Prior Function Level of Independence: Independent               Hand Dominance   Dominant Hand: Right    Extremity/Trunk Assessment   Upper Extremity Assessment Upper Extremity Assessment: Defer to OT evaluation     Lower Extremity Assessment Lower Extremity Assessment: Generalized weakness    Cervical / Trunk Assessment Cervical / Trunk Assessment: Normal  Communication   Communication: No difficulties  Cognition Arousal/Alertness: Awake/alert Behavior During Therapy: WFL for tasks assessed/performed Overall Cognitive Status: Within Functional Limits for tasks assessed                                        General Comments General comments (skin integrity, edema, etc.): HR 66-80 bpm. sats 100% on RA with 4L,  O2 sat down to 70% on 4L with activity but recoverd quickly to 90% within 1 min of rest    Exercises     Assessment/Plan    PT Assessment Patient needs continued PT services  PT Problem List Decreased activity tolerance;Decreased mobility;Cardiopulmonary status limiting activity       PT Treatment Interventions DME instruction;Gait training;Functional mobility training;Therapeutic activities;Therapeutic exercise;Balance training;Patient/family education    PT Goals (Current goals can be found in the Care Plan section)  Acute Rehab PT Goals Patient Stated Goal: to go home PT Goal Formulation: With patient Time For Goal Achievement: 07/15/19 Potential to Achieve Goals: Good    Frequency Min 3X/week   Barriers to discharge        Co-evaluation               AM-PAC PT "6 Clicks" Mobility  Outcome Measure Help needed turning from your back to your side while in a flat bed without using bedrails?: None Help needed moving from lying on your back to sitting on the side of a flat bed without using bedrails?: None Help needed moving to and from a bed to a chair (including a wheelchair)?: None Help needed standing up from a chair using your arms (e.g., wheelchair or bedside chair)?: None Help needed to walk in hospital room?: None Help needed climbing 3-5 steps with a railing? : None 6 Click Score: 24    End of Session Equipment Utilized During  Treatment: Gait belt;Oxygen Activity Tolerance: Patient limited by fatigue Patient left: with call bell/phone within reach;in bed Nurse Communication: Mobility status PT Visit Diagnosis: Muscle weakness (generalized) (M62.81);Difficulty in walking, not elsewhere classified (R26.2)    Time: 5397-6734 PT Time Calculation (min) (ACUTE ONLY): 20 min   Charges:   PT Evaluation $PT Eval Moderate Complexity: 1 Mod          Haleigh Desmith W,PT Acute Rehabilitation Services Pager:  [phone]  Office:  [place] 07/01/2019, 2:49 PM

## 2019-07-01 NOTE — ED Notes (Signed)
Purewick applied to Pt

## 2019-07-01 NOTE — ED Triage Notes (Signed)
Pt bib ems, pt having chronic issues with CHF, getting worse since january. With it getting significantly worse this week with exertion. Pt on 3-4 L of o2 at home. sattign at 95%. Pt dropped into the 70's with the 3 L and exertion. Pt also reporting some chestpain. Pt refused nitro.   148/72 68pulse 20rr 100%4L 269cbg.  97.7*f

## 2019-07-02 ENCOUNTER — Telehealth: Payer: Self-pay | Admitting: Cardiology

## 2019-07-02 ENCOUNTER — Telehealth (HOSPITAL_COMMUNITY): Payer: Self-pay

## 2019-07-02 DIAGNOSIS — I509 Heart failure, unspecified: Secondary | ICD-10-CM | POA: Diagnosis not present

## 2019-07-02 NOTE — Telephone Encounter (Signed)
Heather, patient's community paramedic, calling stating the patient went to hospital for fluid overload yesterday. She states the patient has been on lasix, but would like to know if she should be switched to torsemide. She also states the patient's oxygen levels dropped yesterday to 83% on exertion from walking a few feet in her room. She states while resting it is 96%. Nira Conn can be reached at [phone] or [phone].

## 2019-07-02 NOTE — Telephone Encounter (Signed)
Got call from paramedic on scene with Kara Hanson. [facility] PT on scene called EMS out for shortness of breath after Kara Hanson had a drop in her oxygen levels upon exertion according to EMS on scene her oxygen dropped to 83% on exertion with mild increase of shortness of breath with 4LPM and at rest was 96% on 4LPM.  Kara Hanson also reports taking her 80mg  PO Lasix today and one dose of Metolazone. She reports only having a urine output of about 100cc today so far. I advised EMS on scene I would be reaching out to Dr. Marlou Porch office about same for further guidance and that I will be seeing Kara Hanson tomorrow in home at 1400. [facility] will also be seeing patient in the morning. I left message with RN at [facility] at 1630.  I will await reply from Dr. Marlou Porch office for further guidance.

## 2019-07-02 NOTE — Telephone Encounter (Signed)
Notified Nira Conn unable to discuss pt's healthcare will have to get permission from pt ./cy

## 2019-07-03 ENCOUNTER — Ambulatory Visit: Payer: Medicare Other | Admitting: Neurology

## 2019-07-03 ENCOUNTER — Telehealth: Payer: Self-pay | Admitting: Neurology

## 2019-07-03 ENCOUNTER — Other Ambulatory Visit: Payer: Self-pay

## 2019-07-03 DIAGNOSIS — I509 Heart failure, unspecified: Secondary | ICD-10-CM | POA: Diagnosis not present

## 2019-07-03 NOTE — Telephone Encounter (Signed)
Pt called today a little after 10 am to cancel her apt with Dr Rexene Alberts. Pt was scheduled as a new pt apt for tremors. This being cancelled in less then 2 hrs before her apt time will count the patient as a no show. The patient also no showed NP apt in 03/19/19.    Today makes patient's 2nd new patient, no show.

## 2019-07-03 NOTE — Telephone Encounter (Signed)
Kara Hanson calling back with the patient to discuss the patient's medication.

## 2019-07-03 NOTE — Telephone Encounter (Signed)
Noted.   Kara Hanson 

## 2019-07-03 NOTE — Telephone Encounter (Signed)
Please let her know - I would favor staying on current regimen - I am concerned that she is not taking her medicines correctly.   She needs to get her scales.   We have given her appointment to see Dr. Marlou Porch back for next week and can discuss further at that time.   Thanks, Shakea Isip

## 2019-07-03 NOTE — Telephone Encounter (Signed)
Please follow dismissal protocol as per our No Show Policy.   

## 2019-07-03 NOTE — Progress Notes (Signed)
Paramedicine Encounter    Patient ID: Kara Hanson, female    DOB: 10-11-1955, 64 y.o.   MRN: [id number]   Patient Care Team: [name], MD as PCP - General (Family Medicine) Jerline Pain, MD as PCP - Cardiology (Cardiology)  Patient Active Problem List   Diagnosis Date Noted  . Acute systolic CHF (congestive heart failure) ([place]) 05/19/2019  . Acute exacerbation of CHF (congestive heart failure) ([place]) 04/18/2019  . Elevated troponin 04/18/2019  . Acute respiratory failure with hypoxia ([place]) 02/09/2019  . NSTEMI (non-ST elevated myocardial infarction) ([place]) 02/09/2019  . S/P thoracentesis   . Atelectasis   . Chronic respiratory failure ([place])   . Acute renal failure superimposed on stage 3 chronic kidney disease ([place])   . Epigastric pain   . CHF (congestive heart failure) ([place]) 01/23/2019  . CAP (community acquired pneumonia) 10/31/2018  . Acute CHF (congestive heart failure) ([place]) 09/24/2018  . SIRS (systemic inflammatory response syndrome) ([place]) 07/25/2018  . COVID-19 virus infection 07/20/2018  . Chronic diastolic CHF (congestive heart failure) ([place]) 07/20/2018  . Pneumonia due to COVID-19 virus 07/20/2018  . Vitamin D deficiency 04/02/2018  . Spinal stenosis 03/29/2018  . Acute on chronic respiratory failure with hypoxia ([place]) 03/20/2018  . Controlled type 2 diabetes with neuropathy ([place]) 03/20/2018  . CKD (chronic kidney disease), stage III 03/20/2018  . Acute on chronic combined systolic and diastolic CHF (congestive heart failure) ([place]) 11/27/2017  . AKI (acute kidney injury) ([place]) 11/27/2017  . Acute respiratory distress 11/27/2017  . Bulging lumbar disc 11/14/2017  . Gout 11/14/2017  . Hyperlipidemia LDL goal <70 09/30/2016  . Chest pain 06/17/2016  . Stress-induced cardiomyopathy 05/19/2016  . Type 2 diabetes mellitus with diabetic neuropathy, unspecified ([place]) 05/06/2016  . Vertigo 05/06/2016  . Controlled type 2 diabetes mellitus with hyperglycemia ([place])  04/23/2016  . Hypertension 04/23/2016  . Normochromic normocytic anemia 04/23/2016  . Syncope 04/23/2016    Current Outpatient Medications:  .  Accu-Chek FastClix Lancets MISC, USE AS DIRECTED TO TEST BLOOD SUGAR THREE TIMES DAILY, Disp: 102 each, Rfl: 12 .  allopurinol (ZYLOPRIM) 300 MG tablet, Take 300 mg by mouth daily., Disp: , Rfl:  .  aspirin EC 81 MG tablet, Take 81 mg by mouth daily., Disp: , Rfl:  .  atorvastatin (LIPITOR) 80 MG tablet, Take 80 mg by mouth daily at 6 PM., Disp: , Rfl:  .  Blood Glucose Monitoring Suppl (ACCU-CHEK GUIDE) w/Device KIT, 1 each by Does not apply route 3 (three) times daily., Disp: 1 kit, Rfl: 0 .  carvedilol (COREG) 25 MG tablet, Take 1 tablet (25 mg total) by mouth 2 (two) times daily with a meal., Disp: 60 tablet, Rfl: 3 .  DULoxetine (CYMBALTA) 30 MG capsule, Take 1 capsule (30 mg total) by mouth daily., Disp: 30 capsule, Rfl: 3 .  EASY COMFORT PEN NEEDLES 31G X 5 MM MISC, USE FOUR TIMES PER DAY FOR INSULIN ADMINISTRATION, Disp: 200 each, Rfl: 12 .  ergocalciferol (VITAMIN D2) 1.25 MG (50000 UT) capsule, Take 1 capsule by mouth once a week., Disp: , Rfl:  .  furosemide (LASIX) 80 MG tablet, Take 80 mg by mouth 2 (two) times daily., Disp: , Rfl:  .  gabapentin (NEURONTIN) 100 MG capsule, Take 1 capsule (100 mg total) by mouth at bedtime., Disp:  , Rfl:  .  glucose blood (ACCU-CHEK GUIDE) test strip, USE AS DIRECTED TO TEST BLOOD SUGAR THREE TIMES DAILY, Disp: 100 each, Rfl: 12 .  insulin aspart (  NOVOLOG) 100 UNIT/ML injection, 0 to 12 units subcutaneously 3 times daily before meals as per sliding scale, Disp: 30 mL, Rfl: 3 .  Insulin Glargine (BASAGLAR KWIKPEN) 100 UNIT/ML SOPN, Inject 0.15 mLs (15 Units total) into the skin at bedtime., Disp: 30 mL, Rfl: 3 .  Lancet Device MISC, Use as instructed 3 times daily, Disp: 1 each, Rfl: 0 .  lansoprazole (PREVACID) 15 MG capsule, Take 15 mg by mouth daily. , Disp: , Rfl:  .  metoCLOPramide (REGLAN) 5 MG  tablet, Take 1 tablet (5 mg total) by mouth 3 (three) times daily., Disp: 90 tablet, Rfl: 1 .  Misc. Devices MISC, Portable oxygen concentrator.  Diagnosis-chronic respiratory failure., Disp: 1 each, Rfl: 0 .  Misc. Devices MISC, Roll[place] with seat. Dx: Congestive Heart Failure, Disp: 1 each, Rfl: 0 .  Misc. Devices MISC, Scale; Dx - CHF, Disp: 1 each, Rfl: 0 .  polyethylene glycol (MIRALAX / GLYCOLAX) 17 g packet, Take 17 g by mouth daily as needed for moderate constipation., Disp: , Rfl:  .  sodium chloride (OCEAN) 0.65 % SOLN nasal spray, Place 1 spray into both nostrils as needed for congestion., Disp: , Rfl:  .  Multiple Vitamins-Minerals (MULTIVITAMIN WITH MINERALS) tablet, Take 1 tablet by mouth daily., Disp: , Rfl:  .  SUPER B COMPLEX/C CAPS, Take 1 capsule by mouth daily., Disp: , Rfl:  Allergies  Allergen Reactions  . Garlic Shortness Of Breath, Itching and Swelling    Raw*  Hand itching and swelling  . Latex Itching  . Morphine And Related Itching and Other (See Comments)    Headache   . Other Itching    Reaction to newspaper ink -itching and headache     Social History   Socioeconomic History  . Marital status: Widowed    Spouse name: Not on file  . Number of children: Not on file  . Years of education: Not on file  . Highest education level: Not on file  Occupational History  . Not on file  Tobacco Use  . Smoking status: Never Smoker  . Smokeless tobacco: Never Used  Substance and Sexual Activity  . Alcohol use: No  . Drug use: No  . Sexual activity: Not Currently    Birth control/protection: None  Other Topics Concern  . Not on file  Social History Narrative  . Not on file   Social Determinants of Health   Financial Resource Strain:   . Difficulty of Paying Living Expenses:   Food Insecurity:   . Worried About Charity fundraiser in the Last Year:   . Arboriculturist in the Last Year:   Transportation Needs:   . Film/video editor (Medical):   Marland Kitchen  Lack of Transportation (Non-Medical):   Physical Activity:   . Days of Exercise per Week:   . Minutes of Exercise per Session:   Stress:   . Feeling of Stress :   Social Connections:   . Frequency of Communication with Friends and Family:   . Frequency of Social Gatherings with Friends and Family:   . Attends Religious Services:   . Active Member of Clubs or Organizations:   . Attends Archivist Meetings:   Marland Kitchen Marital Status:   Intimate Partner Violence:   . Fear of Current or Ex-Partner:   . Emotionally Abused:   Marland Kitchen Physically Abused:   . Sexually Abused:     Physical Exam Vitals reviewed.  HENT:     Head:  Normocephalic.     Nose: Nose normal.     Mouth/Throat:     Mouth: Mucous membranes are moist.  Eyes:     Pupils: Pupils are equal, round, and reactive to light.  Cardiovascular:     Rate and Rhythm: Normal rate and regular rhythm.     Pulses: Normal pulses.     Heart sounds: Normal heart sounds.  Pulmonary:     Effort: Respiratory distress present.     Breath sounds: Normal breath sounds.     Comments: Shortness of breath on exertion.  Musculoskeletal:        General: Normal range of motion.     Cervical back: Normal range of motion.     Right lower leg: Edema present.     Left lower leg: Edema present.  Skin:    General: Skin is warm and dry.     Capillary Refill: Capillary refill takes less than 2 seconds.  Neurological:     Mental Status: She is alert. Mental status is at baseline.  Psychiatric:        Mood and Affect: Mood normal.     Arrived for home visit for Haedyn who was alert and oriented stating she is feeling a little better today but is still having some shortness of breath while walking short distances. Kent reports weighing this morning at 165lbs. She also stated seh voided around 519m of urine around 0700 this morning at 0700. JKarriganstates [facility] came out and saw her this morning and gave her a scale as well as one Metolazone. JLiya stated she did take her morning medicines today. Pill box noted to have a few doses noted to be missed. I reviewed and confirmed medications. Pill box filled. Potassium delivered by [place], not on list. [place]. Oxygen sat's 76% on exertion on nasal cannula. 95% on rest on nasal cannula. I spoke to RN with [facility]who states Dr. SGillian Shieldswants to keep her on Lasix until April appointment in hopes that patient stays compliant with medications weekly with pill box. JShereseagreed and verbalized understanding. Some edema still noted in lower legs. Patient agreed to visit in one week, I will be following patient closely. Home visit complete.   Weight- 165lbs  CBG- 174   Refills: NONE     Future Appointments  Date Time Provider DBrilliant 07/10/2019 11:40 AM SJerline Pain MD [facility]  07/10/2019  2:10 PM NCharlott Rakes MD [facility] None     ACTION: Home visit completed Next visit planned for one week

## 2019-07-03 NOTE — Telephone Encounter (Signed)
S/w Nira Conn, community paramedic stated Lori's recommendation's.  Nira Conn has been doing pt's medications for a week and pt has been compliant.  Nira Conn stated hopefully by the time pt sees Dr.Skains for upcoming appt pt will be back on track.  Will send to [place] to [place].

## 2019-07-03 NOTE — Telephone Encounter (Signed)
Spoke with [name] who verbalized that it was okay for [place] to speak to [facility] regarding her care. Nira Conn states that she is a Clinical biochemist that just started seeing the patient last week and she is helping to manage her medications. Patient was sent to the ER from the office on 03/29 by Truitt Merle. She received IV Lasix and her weight has decreased. She is SOB still but only with exertion. Nira Conn states that there was discussion of switching the patient to torsemide and would like to know if this was going to be done so that she can have her medications updated correctly. I advised her that I will reach out to [facility] and Dr. Marlou Porch for advisement.  Nira Conn states that she can be reached on her personal number [phone].

## 2019-07-04 ENCOUNTER — Telehealth: Payer: Self-pay

## 2019-07-04 NOTE — Telephone Encounter (Signed)
Completed SCAT application faxed to [employer] [place] eligibility.

## 2019-07-09 ENCOUNTER — Telehealth: Payer: Self-pay

## 2019-07-09 ENCOUNTER — Encounter: Payer: Self-pay | Admitting: Neurology

## 2019-07-09 NOTE — Telephone Encounter (Signed)
Message received from [name], EMT noting that the patient is requesting a hospital bed.

## 2019-07-10 ENCOUNTER — Telehealth (HOSPITAL_COMMUNITY): Payer: Self-pay | Admitting: Licensed Clinical Social Worker

## 2019-07-10 ENCOUNTER — Ambulatory Visit: Payer: Medicare Other | Attending: Family Medicine | Admitting: Family Medicine

## 2019-07-10 ENCOUNTER — Other Ambulatory Visit: Payer: Self-pay | Admitting: Family Medicine

## 2019-07-10 ENCOUNTER — Ambulatory Visit: Payer: Medicare Other | Admitting: Cardiology

## 2019-07-10 ENCOUNTER — Other Ambulatory Visit: Payer: Self-pay

## 2019-07-10 ENCOUNTER — Other Ambulatory Visit (HOSPITAL_COMMUNITY): Payer: Self-pay

## 2019-07-10 DIAGNOSIS — J9611 Chronic respiratory failure with hypoxia: Secondary | ICD-10-CM

## 2019-07-10 DIAGNOSIS — I5033 Acute on chronic diastolic (congestive) heart failure: Secondary | ICD-10-CM

## 2019-07-10 DIAGNOSIS — E1122 Type 2 diabetes mellitus with diabetic chronic kidney disease: Secondary | ICD-10-CM

## 2019-07-10 DIAGNOSIS — Z7982 Long term (current) use of aspirin: Secondary | ICD-10-CM | POA: Diagnosis not present

## 2019-07-10 DIAGNOSIS — M542 Cervicalgia: Secondary | ICD-10-CM | POA: Diagnosis not present

## 2019-07-10 DIAGNOSIS — E1121 Type 2 diabetes mellitus with diabetic nephropathy: Secondary | ICD-10-CM | POA: Diagnosis not present

## 2019-07-10 DIAGNOSIS — E1143 Type 2 diabetes mellitus with diabetic autonomic (poly)neuropathy: Secondary | ICD-10-CM

## 2019-07-10 DIAGNOSIS — R251 Tremor, unspecified: Secondary | ICD-10-CM | POA: Diagnosis not present

## 2019-07-10 DIAGNOSIS — Z794 Long term (current) use of insulin: Secondary | ICD-10-CM

## 2019-07-10 DIAGNOSIS — N1832 Chronic kidney disease, stage 3b: Secondary | ICD-10-CM

## 2019-07-10 MED ORDER — MISC. DEVICES MISC: 0 refills | Status: DC

## 2019-07-10 NOTE — Progress Notes (Addendum)
Virtual Visit via Video Note  I connected with Kara Hanson, on 07/10/2019 at 2:12 PM by video enabled telemedicine device due to the COVID-19 pandemic and verified that I am speaking with the correct person using two identifiers.   Consent: I discussed the limitations, risks, security and privacy concerns of performing an evaluation and management service by telemedicine and the availability of in person appointments. I also discussed with the patient that there may be a patient responsible charge related to this service. The patient expressed understanding and agreed to proceed.   Location of Patient: Home  Location of Provider: Clinic   Persons participating in Telemedicine visit: Kara Hanson-CMA Kara Hanson, paramedicine EMT Dr. Margarita Hanson     History of Present Illness: Kara Hanson a 64 year old female with a history of type 2 diabetes mellitus (A1c 7.4), hypertension, congestive heart failure (EF 40-45% from echo of1/2021, chronic respiratory failure on 4 L of oxygen, COVID-19 related pneumonia in 07/2018, multiple hospitalizations for CHF exacerbation seen for follow-up visit. She had an ED visit on 07/01/2018 where she was treated with IV Lasix after referral from the Cardiology clinic due to dyspnea and hypoxia and subsequently discharged home.    Due to her CHF she is requesting a hospital bed today to allow for elevation of the head of her bed to >30 degrees and she would also need a bed that can be adjustable as she has fallen off while getting up from bed. Due to CHF she requires frequent changes in body position not feasible with an ordinary bed. Also complains of dyspnea which is chronic and worsened by abdomen pushing up against her lungs.  She feels her stomach is so full and she has early satiety, reduced appetite despite moving her bowels regularly with no constipation, nausea or vomiting but has had some diarrhea in the past.  Her  medication list reveals she is on Reglan which she uses as needed.  For her dyspnea she is on furosemide and information received from the para medicine EMT states that she has also been on metolazone and 20 meq of potassium prescribed by [facility] after her most recent ED visit. Cardiology appointment comes up later this month. Her blood sugars have been stable and random blood sugar at the moment is 208. She continues to experience tremors and  was scheduled to see [facility] however she canceled her appointment late and was discharged from the practice due to previous no-show as well.  She will need a new Neurology referral. She has also have some neck pain and is unsure if she slept wrong.  Past Medical History:  Diagnosis Date  . Acute CHF (congestive heart failure) ([place]) 09/24/2018  . Acute on chronic combined systolic and diastolic CHF (congestive heart failure) ([place]) 11/27/2017  . Acute on chronic respiratory failure with hypoxia ([place]) 03/20/2018  . Acute renal failure superimposed on stage 3 chronic kidney disease ([place])   . Acute respiratory distress 11/27/2017  . Acute respiratory failure with hypoxia ([place]) 02/09/2019  . Acute systolic CHF (congestive heart failure) ([place]) 05/19/2019  . AKI (acute kidney injury) ([place]) 11/27/2017  . Atelectasis   . Bulging lumbar disc 11/14/2017  . CAP (community acquired pneumonia) 10/31/2018  . Chest pain 06/17/2016  . CHF (congestive heart failure) ([place])   . Chronic diastolic CHF (congestive heart failure) ([place]) 07/20/2018  . Chronic respiratory failure ([place])   . CKD (chronic kidney disease), stage III 03/20/2018  . Controlled type 2 diabetes mellitus  with hyperglycemia ([place]) 04/23/2016  . Controlled type 2 diabetes with neuropathy ([place]) 03/20/2018  . COVID-19 virus infection 07/20/2018  . CVA (cerebral vascular accident) ([place])   . Diabetes mellitus without complication ([place])   . Dyspnea   . Elevated troponin 04/18/2019  .  Epigastric pain   . Gout 11/14/2017  . Hypercholesteremia   . Hyperlipidemia LDL goal <70 09/30/2016  . Hypertension   . Myocardial infarction ([place])   . Normochromic normocytic anemia 04/23/2016  . NSTEMI (non-ST elevated myocardial infarction) ([place]) 02/09/2019  . Pneumonia 11/01/2018  . Pneumonia due to COVID-19 virus 07/20/2018  . S/P thoracentesis   . SIRS (systemic inflammatory response syndrome) ([place]) 07/25/2018  . Spinal stenosis   . Stress-induced cardiomyopathy 05/19/2016  . Syncope 04/23/2016  . Type 2 diabetes mellitus with diabetic neuropathy, unspecified ([place]) 05/06/2016  . Vertigo 05/06/2016  . Vitamin D deficiency 04/02/2018   Allergies  Allergen Reactions  . Garlic Shortness Of Breath, Itching and Swelling    Raw*  Hand itching and swelling  . Latex Itching  . Morphine And Related Itching and Other (See Comments)    Headache   . Other Itching    Reaction to newspaper ink -itching and headache    Current Outpatient Medications on File Prior to Visit  Medication Sig Dispense Refill  . Accu-Chek FastClix Lancets MISC USE AS DIRECTED TO TEST BLOOD SUGAR THREE TIMES DAILY 102 each 12  . allopurinol (ZYLOPRIM) 300 MG tablet Take 300 mg by mouth daily.    Marland Kitchen aspirin EC 81 MG tablet Take 81 mg by mouth daily.    Marland Kitchen atorvastatin (LIPITOR) 80 MG tablet Take 80 mg by mouth daily at 6 PM.    . Blood Glucose Monitoring Suppl (ACCU-CHEK GUIDE) w/Device KIT 1 each by Does not apply route 3 (three) times daily. 1 kit 0  . carvedilol (COREG) 25 MG tablet Take 1 tablet (25 mg total) by mouth 2 (two) times daily with a meal. 60 tablet 3  . DULoxetine (CYMBALTA) 30 MG capsule Take 1 capsule (30 mg total) by mouth daily. 30 capsule 3  . EASY COMFORT PEN NEEDLES 31G X 5 MM MISC USE FOUR TIMES PER DAY FOR INSULIN ADMINISTRATION 200 each 12  . ergocalciferol (VITAMIN D2) 1.25 MG (50000 UT) capsule Take 1 capsule by mouth once a week.    . furosemide (LASIX) 80 MG tablet Take 80 mg by mouth 2 (two)  times daily.    Marland Kitchen gabapentin (NEURONTIN) 100 MG capsule Take 1 capsule (100 mg total) by mouth at bedtime.    Marland Kitchen glucose blood (ACCU-CHEK GUIDE) test strip USE AS DIRECTED TO TEST BLOOD SUGAR THREE TIMES DAILY 100 each 12  . insulin aspart (NOVOLOG) 100 UNIT/ML injection 0 to 12 units subcutaneously 3 times daily before meals as per sliding scale 30 mL 3  . Insulin Glargine (BASAGLAR KWIKPEN) 100 UNIT/ML SOPN Inject 0.15 mLs (15 Units total) into the skin at bedtime. 30 mL 3  . Lancet Device MISC Use as instructed 3 times daily 1 each 0  . lansoprazole (PREVACID) 15 MG capsule Take 15 mg by mouth daily.     . metoCLOPramide (REGLAN) 5 MG tablet Take 1 tablet (5 mg total) by mouth 3 (three) times daily. 90 tablet 1  . Misc. Devices MISC Portable oxygen concentrator.  Diagnosis-chronic respiratory failure. 1 each 0  . Misc. Devices MISC Rollaor with seat. Dx: Congestive Heart Failure 1 each 0  . Misc. Devices MISC Scale; Dx -  CHF 1 each 0  . Multiple Vitamins-Minerals (MULTIVITAMIN WITH MINERALS) tablet Take 1 tablet by mouth daily.    . polyethylene glycol (MIRALAX / GLYCOLAX) 17 g packet Take 17 g by mouth daily as needed for moderate constipation.    . sodium chloride (OCEAN) 0.65 % SOLN nasal spray Place 1 spray into both nostrils as needed for congestion.    . SUPER B COMPLEX/C CAPS Take 1 capsule by mouth daily.     No current facility-administered medications on file prior to visit.    Observations/Objective: Awake, alert, oriented x3 Not in acute distress BP-110/58, heart rate 72, oxygen saturation-98% on 4 L of oxygen Abdomen-nondistended, nontender to palpation Extremities-1+ pedal edema bilaterally  CMP Latest Ref Rng & Units 07/01/2019 07/01/2019 05/22/2019  Glucose 70 - 99 mg/dL - 234(H) 65(L)  BUN 8 - 23 mg/dL - 42(H) 55(H)  Creatinine 0.44 - 1.00 mg/dL - 1.77(H) 1.94(H)  Sodium 135 - 145 mmol/L 139 138 139  Potassium 3.5 - 5.1 mmol/L 3.8 3.8 4.1  Chloride 98 - 111 mmol/L -  102 100  CO2 22 - 32 mmol/L - 26 32  Calcium 8.9 - 10.3 mg/dL - 9.2 9.2  Total Protein 6.5 - 8.1 g/dL - - -  Total Bilirubin 0.3 - 1.2 mg/dL - - -  Alkaline Phos 38 - 126 U/L - - -  AST 15 - 41 U/L - - -  ALT 0 - 44 U/L - - -    Lab Results  Component Value Date   HGBA1C 7.4 (H) 04/18/2019     Assessment and Plan: 1. Diabetes mellitus with gastroparesis ([place]) Gastroparesis is uncontrolled She will need a gastric emptying scan Currently on Reglan - Ambulatory referral to Gastroenterology  2. Acute on chronic diastolic CHF (congestive heart failure) (HCC) EF 40 to 45% Currently at baseline Followed closely by [facility] and uses metolazone as needed Daily weights, fluid restriction Continue Lasix, beta-blocker Follow-up with cardiology - Misc. Devices MISC; Hospital bed.  Diagnosis CHF.  Lifetime use.  Weight 165 lbs.  Dispense: 1 each; Refill: 0  3. Type 2 diabetes mellitus with stage 3b chronic kidney disease, with long-term current use of insulin (HCC) Controlled with A1c of 7.4; goal is less than 8.0 given multiple comorbidities Continue current regimen, diabetic diet  4. Chronic respiratory failure with hypoxia (HCC) Doing well on 4 L of oxygen  5. Neck pain She will be commencing home PT Advised to use heating pad We will hold off on muscle relaxant as she is high risk for fall.  6.  Tremors [facility]charged from [facility] We will place another referral to Neurology   Follow Up Instructions: 2 months   I discussed the assessment and treatment plan with the patient. The patient was provided an opportunity to ask questions and all were answered. The patient agreed with the plan and demonstrated an understanding of the instructions.   The patient was advised to call back or seek an in-person evaluation if the symptoms worsen or if the condition fails to improve as anticipated.     I provided 24 minutes total of Telehealth time during this  encounter including median intraservice time, reviewing previous notes, investigations, ordering medications, medical decision making, coordinating care and patient verbalized understanding at the end of the visit.     [name], MD, FAAFP. [facility] and [facility]ney, [place]   07/10/2019, 2:12 PM

## 2019-07-10 NOTE — Progress Notes (Signed)
States that her breathing is hard.

## 2019-07-10 NOTE — Progress Notes (Signed)
Paramedicine Encounter    Patient ID: Kara Hanson, female    DOB: Jun 26, 1955, 64 y.o.   MRN: [id number]   Patient Care Team: [name], MD as PCP - General (Family Medicine) Jerline Pain, MD as PCP - Cardiology (Cardiology)  Patient Active Problem List   Diagnosis Date Noted  . Acute systolic CHF (congestive heart failure) ([place]) 05/19/2019  . Acute exacerbation of CHF (congestive heart failure) ([place]) 04/18/2019  . Elevated troponin 04/18/2019  . Acute respiratory failure with hypoxia ([place]) 02/09/2019  . NSTEMI (non-ST elevated myocardial infarction) ([place]) 02/09/2019  . S/P thoracentesis   . Atelectasis   . Chronic respiratory failure ([place])   . Acute renal failure superimposed on stage 3 chronic kidney disease ([place])   . Epigastric pain   . CHF (congestive heart failure) ([place]) 01/23/2019  . CAP (community acquired pneumonia) 10/31/2018  . Acute CHF (congestive heart failure) ([place]) 09/24/2018  . SIRS (systemic inflammatory response syndrome) ([place]) 07/25/2018  . COVID-19 virus infection 07/20/2018  . Chronic diastolic CHF (congestive heart failure) ([place]) 07/20/2018  . Pneumonia due to COVID-19 virus 07/20/2018  . Vitamin D deficiency 04/02/2018  . Spinal stenosis 03/29/2018  . Acute on chronic respiratory failure with hypoxia ([place]) 03/20/2018  . Controlled type 2 diabetes with neuropathy ([place]) 03/20/2018  . CKD (chronic kidney disease), stage III 03/20/2018  . Acute on chronic combined systolic and diastolic CHF (congestive heart failure) ([place]) 11/27/2017  . AKI (acute kidney injury) ([place]) 11/27/2017  . Acute respiratory distress 11/27/2017  . Bulging lumbar disc 11/14/2017  . Gout 11/14/2017  . Hyperlipidemia LDL goal <70 09/30/2016  . Chest pain 06/17/2016  . Stress-induced cardiomyopathy 05/19/2016  . Type 2 diabetes mellitus with diabetic neuropathy, unspecified ([place]) 05/06/2016  . Vertigo 05/06/2016  . Controlled type 2 diabetes mellitus with hyperglycemia ([place])  04/23/2016  . Hypertension 04/23/2016  . Normochromic normocytic anemia 04/23/2016  . Syncope 04/23/2016    Current Outpatient Medications:  .  allopurinol (ZYLOPRIM) 300 MG tablet, Take 300 mg by mouth daily., Disp: , Rfl:  .  aspirin EC 81 MG tablet, Take 81 mg by mouth daily., Disp: , Rfl:  .  atorvastatin (LIPITOR) 80 MG tablet, Take 80 mg by mouth daily at 6 PM., Disp: , Rfl:  .  carvedilol (COREG) 25 MG tablet, Take 1 tablet (25 mg total) by mouth 2 (two) times daily with a meal., Disp: 60 tablet, Rfl: 3 .  DULoxetine (CYMBALTA) 30 MG capsule, Take 1 capsule (30 mg total) by mouth daily., Disp: 30 capsule, Rfl: 3 .  EASY COMFORT PEN NEEDLES 31G X 5 MM MISC, USE FOUR TIMES PER DAY FOR INSULIN ADMINISTRATION, Disp: 200 each, Rfl: 12 .  ergocalciferol (VITAMIN D2) 1.25 MG (50000 UT) capsule, Take 1 capsule by mouth once a week., Disp: , Rfl:  .  furosemide (LASIX) 80 MG tablet, Take 80 mg by mouth 2 (two) times daily., Disp: , Rfl:  .  gabapentin (NEURONTIN) 100 MG capsule, Take 1 capsule (100 mg total) by mouth at bedtime., Disp:  , Rfl:  .  insulin aspart (NOVOLOG) 100 UNIT/ML injection, 0 to 12 units subcutaneously 3 times daily before meals as per sliding scale, Disp: 30 mL, Rfl: 3 .  Insulin Glargine (BASAGLAR KWIKPEN) 100 UNIT/ML SOPN, Inject 0.15 mLs (15 Units total) into the skin at bedtime., Disp: 30 mL, Rfl: 3 .  Lancet Device MISC, Use as instructed 3 times daily, Disp: 1 each, Rfl: 0 .  lansoprazole (PREVACID) 15 MG  capsule, Take 15 mg by mouth daily. , Disp: , Rfl:  .  metoCLOPramide (REGLAN) 5 MG tablet, Take 1 tablet (5 mg total) by mouth 3 (three) times daily., Disp: 90 tablet, Rfl: 1 .  Misc. Devices MISC, Portable oxygen concentrator.  Diagnosis-chronic respiratory failure., Disp: 1 each, Rfl: 0 .  Misc. Devices MISC, Rollaor with seat. Dx: Congestive Heart Failure, Disp: 1 each, Rfl: 0 .  Misc. Devices MISC, Scale; Dx - CHF, Disp: 1 each, Rfl: 0 .  sodium chloride  (OCEAN) 0.65 % SOLN nasal spray, Place 1 spray into both nostrils as needed for congestion., Disp: , Rfl:  .  Accu-Chek FastClix Lancets MISC, USE AS DIRECTED TO TEST BLOOD SUGAR THREE TIMES DAILY (Patient not taking: Reported on 07/10/2019), Disp: 102 each, Rfl: 12 .  Blood Glucose Monitoring Suppl (ACCU-CHEK GUIDE) w/Device KIT, 1 each by Does not apply route 3 (three) times daily. (Patient not taking: Reported on 07/10/2019), Disp: 1 kit, Rfl: 0 .  glucose blood (ACCU-CHEK GUIDE) test strip, USE AS DIRECTED TO TEST BLOOD SUGAR THREE TIMES DAILY (Patient not taking: Reported on 07/10/2019), Disp: 100 each, Rfl: 12 .  Misc. Devices [place] Hospital bed.  Diagnosis CHF.  Lifetime use.  Weight 165 lbs., Disp: 1 each, Rfl: 0 .  Multiple Vitamins-Minerals (MULTIVITAMIN WITH MINERALS) tablet, Take 1 tablet by mouth daily., Disp: , Rfl:  .  polyethylene glycol (MIRALAX / GLYCOLAX) 17 g packet, Take 17 g by mouth daily as needed for moderate constipation. (Patient not taking: Reported on 07/10/2019), Disp: , Rfl:  .  SUPER B COMPLEX/C CAPS, Take 1 capsule by mouth daily., Disp: , Rfl:  Allergies  Allergen Reactions  . Garlic Shortness Of Breath, Itching and Swelling    Raw*  Hand itching and swelling  . Latex Itching  . Morphine And Related Itching and Other (See Comments)    Headache   . Other Itching    Reaction to newspaper ink -itching and headache     Social History   Socioeconomic History  . Marital status: Widowed    Spouse name: Not on file  . Number of children: Not on file  . Years of education: Not on file  . Highest education level: Not on file  Occupational History  . Not on file  Tobacco Use  . Smoking status: Never Smoker  . Smokeless tobacco: Never Used  Substance and Sexual Activity  . Alcohol use: No  . Drug use: No  . Sexual activity: Not Currently    Birth control/protection: None  Other Topics Concern  . Not on file  Social History Narrative  . Not on file   Social  Determinants of Health   Financial Resource Strain:   . Difficulty of Paying Living Expenses:   Food Insecurity:   . Worried About Charity fundraiser in the Last Year:   . Arboriculturist in the Last Year:   Transportation Needs:   . Film/video editor (Medical):   Marland Kitchen Lack of Transportation (Non-Medical):   Physical Activity:   . Days of Exercise per Week:   . Minutes of Exercise per Session:   Stress:   . Feeling of Stress :   Social Connections:   . Frequency of Communication with Friends and Family:   . Frequency of Social Gatherings with Friends and Family:   . Attends Religious Services:   . Active Member of Clubs or Organizations:   . Attends Archivist Meetings:   .  Marital Status:   Intimate Partner Violence:   . Fear of Current or Ex-Partner:   . Emotionally Abused:   Marland Kitchen Physically Abused:   . Sexually Abused:     Physical Exam Vitals reviewed.  HENT:     Head: Normocephalic.     Nose: Nose normal.     Mouth/Throat:     Mouth: Mucous membranes are moist.  Eyes:     Pupils: Pupils are equal, round, and reactive to light.  Cardiovascular:     Rate and Rhythm: Normal rate and regular rhythm.     Pulses: Normal pulses.     Heart sounds: Normal heart sounds.  Pulmonary:     Effort: Pulmonary effort is normal.     Breath sounds: Normal breath sounds.     Comments: Reports shortness of breath, LS clear.  Abdominal:     General: There is distension.     Tenderness: There is abdominal tenderness.  Musculoskeletal:        General: Normal range of motion.     Cervical back: Normal range of motion and neck supple.     Right lower leg: Edema present.     Left lower leg: Edema present.     Comments: Mild lower leg edema   Skin:    General: Skin is warm and dry.     Capillary Refill: Capillary refill takes less than 2 seconds.  Neurological:     Mental Status: She is alert. Mental status is at baseline.  Psychiatric:        Mood and Affect: Mood  normal.    Arrived for home visit for Zykiria who was alert and oriented standing in the hallway on my arrival. Eleisha reports feeling okay but having some shortness of breath with abdominal pain. Nakima's abdomen was distended and tight with tenderness on palpation.Shonica stated she has had some diarrhea with abdominal pain. Vitals obtained and are as noted. Today we are having a virtual visit with Dr. Margarita Rana. Dr. Margarita Rana will be referring patient to GI. Patient rescheduled her Cardio appointment for the 15th at 330. Dr. Margarita Rana explained no show policy to patient. Patient will require new referral to Neurologist due to same. Patient verbalized understanding.  Medications were left as is. Patient's medications were reviewed an confirmed. Pill box filled.  Patient weight down significantly this week. Patient received doses of Metolazone and Potassium per [facility] after her ED visit with IV lasix. I made Dr. Margarita Rana aware of same. Dr. Margarita Rana expressed she would like to be made aware of this as well as labs. I expressed I would pass this along to [facility] RN. Patient agreed with home visit in one week. Home visit complete.    Weight today- 148lbs  Weight last week- 165lbs   CBG- 205   Mom's Meals should be delivered next week.    Refills:  - Lansoprazole  -Vitamin D   Future Appointments  Date Time Provider [place]  07/18/2019  3:40 PM Jerline Pain, MD [id number]     ACTION: Home visit completed Next visit planned for one week

## 2019-07-10 NOTE — Telephone Encounter (Signed)
Would you please check with the DME company regarding requirements for hospital bed? Office visit versus just an order and we can proceed from there thanks.

## 2019-07-10 NOTE — Telephone Encounter (Signed)
Patient identified as a candidate to receive 7 heart healthy meals per week for 4 weeks through [employer].  Completed referral sent in for review.  Anticipate patient will receive first shipment of food in 1-3 business days.  Kara Hanson H. Kara Myles, LCSW Clinical Social Worker [facility] Desk#: [phone] Cell#: [phone]  

## 2019-07-13 DIAGNOSIS — I509 Heart failure, unspecified: Secondary | ICD-10-CM | POA: Diagnosis not present

## 2019-07-15 NOTE — Telephone Encounter (Signed)
Call placed to [employer], spoke to [facility] who confirmed with Kerry Dory that a telehealth visit is acceptable for the face to face encounter for the hospital bed.  He also confirmed that they have the order for the hospital bed   The provider's notes from 07/10/2019 were faxed to [place].

## 2019-07-17 ENCOUNTER — Other Ambulatory Visit (HOSPITAL_COMMUNITY): Payer: Self-pay

## 2019-07-17 NOTE — Progress Notes (Signed)
Paramedicine Encounter    Patient ID: Kara Hanson, female    DOB: July 15, 1955, 64 y.o.   MRN: [id number]   Patient Care Team: [name], MD as PCP - General (Family Medicine) Jerline Pain, MD as PCP - Cardiology (Cardiology)  Patient Active Problem List   Diagnosis Date Noted  . Acute systolic CHF (congestive heart failure) ([place]) 05/19/2019  . Acute exacerbation of CHF (congestive heart failure) ([place]) 04/18/2019  . Elevated troponin 04/18/2019  . Acute respiratory failure with hypoxia ([place]) 02/09/2019  . NSTEMI (non-ST elevated myocardial infarction) ([place]) 02/09/2019  . S/P thoracentesis   . Atelectasis   . Chronic respiratory failure ([place])   . Acute renal failure superimposed on stage 3 chronic kidney disease ([place])   . Epigastric pain   . CHF (congestive heart failure) ([place]) 01/23/2019  . CAP (community acquired pneumonia) 10/31/2018  . Acute CHF (congestive heart failure) ([place]) 09/24/2018  . SIRS (systemic inflammatory response syndrome) ([place]) 07/25/2018  . COVID-19 virus infection 07/20/2018  . Chronic diastolic CHF (congestive heart failure) ([place]) 07/20/2018  . Pneumonia due to COVID-19 virus 07/20/2018  . Vitamin D deficiency 04/02/2018  . Spinal stenosis 03/29/2018  . Acute on chronic respiratory failure with hypoxia ([place]) 03/20/2018  . Controlled type 2 diabetes with neuropathy ([place]) 03/20/2018  . CKD (chronic kidney disease), stage III 03/20/2018  . Acute on chronic combined systolic and diastolic CHF (congestive heart failure) ([place]) 11/27/2017  . AKI (acute kidney injury) ([place]) 11/27/2017  . Acute respiratory distress 11/27/2017  . Bulging lumbar disc 11/14/2017  . Gout 11/14/2017  . Hyperlipidemia LDL goal <70 09/30/2016  . Chest pain 06/17/2016  . Stress-induced cardiomyopathy 05/19/2016  . Type 2 diabetes mellitus with diabetic neuropathy, unspecified ([place]) 05/06/2016  . Vertigo 05/06/2016  . Controlled type 2 diabetes mellitus with hyperglycemia ([place])  04/23/2016  . Hypertension 04/23/2016  . Normochromic normocytic anemia 04/23/2016  . Syncope 04/23/2016    Current Outpatient Medications:  .  Accu-Chek FastClix Lancets MISC, USE AS DIRECTED TO TEST BLOOD SUGAR THREE TIMES DAILY, Disp: 102 each, Rfl: 12 .  aspirin EC 81 MG tablet, Take 81 mg by mouth daily., Disp: , Rfl:  .  atorvastatin (LIPITOR) 80 MG tablet, Take 80 mg by mouth daily at 6 PM., Disp: , Rfl:  .  Blood Glucose Monitoring Suppl (ACCU-CHEK GUIDE) w/Device KIT, 1 each by Does not apply route 3 (three) times daily., Disp: 1 kit, Rfl: 0 .  carvedilol (COREG) 25 MG tablet, Take 1 tablet (25 mg total) by mouth 2 (two) times daily with a meal., Disp: 60 tablet, Rfl: 3 .  DULoxetine (CYMBALTA) 30 MG capsule, Take 1 capsule (30 mg total) by mouth daily., Disp: 30 capsule, Rfl: 3 .  EASY COMFORT PEN NEEDLES 31G X 5 MM MISC, USE FOUR TIMES PER DAY FOR INSULIN ADMINISTRATION, Disp: 200 each, Rfl: 12 .  ergocalciferol (VITAMIN D2) 1.25 MG (50000 UT) capsule, Take 1 capsule by mouth once a week., Disp: , Rfl:  .  furosemide (LASIX) 80 MG tablet, Take 80 mg by mouth 2 (two) times daily., Disp: , Rfl:  .  gabapentin (NEURONTIN) 100 MG capsule, Take 1 capsule (100 mg total) by mouth at bedtime., Disp:  , Rfl:  .  glucose blood (ACCU-CHEK GUIDE) test strip, USE AS DIRECTED TO TEST BLOOD SUGAR THREE TIMES DAILY, Disp: 100 each, Rfl: 12 .  insulin aspart (NOVOLOG) 100 UNIT/ML injection, 0 to 12 units subcutaneously 3 times daily before meals as per sliding  scale, Disp: 30 mL, Rfl: 3 .  Insulin Glargine (BASAGLAR KWIKPEN) 100 UNIT/ML SOPN, Inject 0.15 mLs (15 Units total) into the skin at bedtime., Disp: 30 mL, Rfl: 3 .  Lancet Device MISC, Use as instructed 3 times daily, Disp: 1 each, Rfl: 0 .  lansoprazole (PREVACID) 15 MG capsule, TAKE 1 CAPSULE (15 MG TOTAL) BY MOUTH DAILY., Disp: 30 capsule, Rfl: 2 .  metoCLOPramide (REGLAN) 5 MG tablet, Take 1 tablet (5 mg total) by mouth 3 (three) times  daily., Disp: 90 tablet, Rfl: 1 .  Misc. Devices MISC, Portable oxygen concentrator.  Diagnosis-chronic respiratory failure., Disp: 1 each, Rfl: 0 .  Misc. Devices MISC, Scale; Dx - CHF, Disp: 1 each, Rfl: 0 .  allopurinol (ZYLOPRIM) 300 MG tablet, Take 300 mg by mouth daily., Disp: , Rfl:  .  Misc. Devices MISC, Rollaor with seat. Dx: Congestive Heart Failure (Patient not taking: Reported on 07/17/2019), Disp: 1 each, Rfl: 0 .  Misc. Devices [facility] bed.  Diagnosis CHF.  Lifetime use.  Weight 165 lbs. (Patient not taking: Reported on 07/17/2019), Disp: 1 each, Rfl: 0 .  Multiple Vitamins-Minerals (MULTIVITAMIN WITH MINERALS) tablet, Take 1 tablet by mouth daily., Disp: , Rfl:  .  polyethylene glycol (MIRALAX / GLYCOLAX) 17 g packet, Take 17 g by mouth daily as needed for moderate constipation. (Patient not taking: Reported on 07/10/2019), Disp: , Rfl:  .  sodium chloride (OCEAN) 0.65 % SOLN nasal spray, Place 1 spray into both nostrils as needed for congestion., Disp: , Rfl:  .  SUPER B COMPLEX/C CAPS, Take 1 capsule by mouth daily., Disp: , Rfl:  Allergies  Allergen Reactions  . Garlic Shortness Of Breath, Itching and Swelling    Raw*  Hand itching and swelling  . Latex Itching  . Morphine And Related Itching and Other (See Comments)    Headache   . Other Itching    Reaction to newspaper ink -itching and headache     Social History   Socioeconomic History  . Marital status: Widowed    Spouse name: Not on file  . Number of children: Not on file  . Years of education: Not on file  . Highest education level: Not on file  Occupational History  . Not on file  Tobacco Use  . Smoking status: Never Smoker  . Smokeless tobacco: Never Used  Substance and Sexual Activity  . Alcohol use: No  . Drug use: No  . Sexual activity: Not Currently    Birth control/protection: None  Other Topics Concern  . Not on file  Social History Narrative  . Not on file   Social Determinants of  Health   Financial Resource Strain:   . Difficulty of Paying Living Expenses:   Food Insecurity:   . Worried About Charity fundraiser in the Last Year:   . Arboriculturist in the Last Year:   Transportation Needs:   . Film/video editor (Medical):   Marland Kitchen Lack of Transportation (Non-Medical):   Physical Activity:   . Days of Exercise per Week:   . Minutes of Exercise per Session:   Stress:   . Feeling of Stress :   Social Connections:   . Frequency of Communication with Friends and Family:   . Frequency of Social Gatherings with Friends and Family:   . Attends Religious Services:   . Active Member of Clubs or Organizations:   . Attends Archivist Meetings:   Marland Kitchen Marital Status:  Intimate Partner Violence:   . Fear of Current or Ex-Partner:   . Emotionally Abused:   Marland Kitchen Physically Abused:   . Sexually Abused:     Physical Exam Vitals reviewed.  Constitutional:      Appearance: Normal appearance. She is normal weight.  HENT:     Head: Normocephalic.     Nose: Nose normal.     Mouth/Throat:     Mouth: Mucous membranes are moist.     Pharynx: Oropharynx is clear.  Eyes:     Pupils: Pupils are equal, round, and reactive to light.  Cardiovascular:     Rate and Rhythm: Normal rate and regular rhythm.     Pulses: Normal pulses.     Heart sounds: Normal heart sounds.  Pulmonary:     Effort: Pulmonary effort is normal.     Breath sounds: Normal breath sounds.  Abdominal:     General: There is distension.     Tenderness: There is abdominal tenderness.  Musculoskeletal:        General: Normal range of motion.     Cervical back: Normal range of motion and neck supple.     Right lower leg: Edema present.     Left lower leg: Edema present.  Skin:    General: Skin is warm and dry.     Capillary Refill: Capillary refill takes less than 2 seconds.  Neurological:     Mental Status: She is alert. Mental status is at baseline.  Psychiatric:        Mood and Affect:  Mood normal.     Arrived for Kara Hanson's home visit where she was ambulating around her home with her oxygen in place with no severe respiratory distress noted; whereas last week she had increased shortness of breath upon walking short distances. Kara Hanson noted to have missed several doses of her night time meds as well as a few of her morning medication doses. I educated Kara Hanson on importance of taking medication as I have placed them in her pill box. She understood same. Vitals were obtained and are as noted in report. Slight edema noted to lower legs. Abdomen distended and tight. Patient complained of pain on palpation to abdomen and stated last night she had a panic attack feeling like she could not breath while laying down because her stomach was pushing on her lungs. Lung sounds were clear on assessment. Patient is awaiting referrals for GI and Neuro PCP as well as hospital bed delivery. Medications were reviewed and confirmed. Same placed in pill box. Kara Hanson stated [facility] is continuing to see patient including RN/Medic and Physical Therapy. Home visit complete. Kara Hanson agreed to visit in one week.   Refills: -Lansoprazole  -Vit D -Carvedilol     Future Appointments  Date Time Provider Economy  07/18/2019  3:40 PM Jerline Pain, MD [facility]     ACTION: Home visit completed Next visit planned for one week

## 2019-07-18 ENCOUNTER — Ambulatory Visit (INDEPENDENT_AMBULATORY_CARE_PROVIDER_SITE_OTHER): Payer: Medicare Other | Admitting: Cardiology

## 2019-07-18 ENCOUNTER — Other Ambulatory Visit: Payer: Self-pay

## 2019-07-18 ENCOUNTER — Encounter: Payer: Self-pay | Admitting: Neurology

## 2019-07-18 ENCOUNTER — Telehealth (HOSPITAL_COMMUNITY): Payer: Self-pay

## 2019-07-18 VITALS — BP 136/72 | HR 80 | Ht 59.0 in | Wt 151.0 lb

## 2019-07-18 DIAGNOSIS — I5022 Chronic systolic (congestive) heart failure: Secondary | ICD-10-CM | POA: Diagnosis not present

## 2019-07-18 DIAGNOSIS — E7800 Pure hypercholesterolemia, unspecified: Secondary | ICD-10-CM | POA: Diagnosis not present

## 2019-07-18 DIAGNOSIS — I10 Essential (primary) hypertension: Secondary | ICD-10-CM | POA: Diagnosis not present

## 2019-07-18 NOTE — Progress Notes (Signed)
Cardiology Office Note:    Date:  07/18/2019   ID:  Kara Hanson, DOB 1955/12/16, MRN [id number]  PCP:  [name], MD  Cardiologist:  Candee Furbish, MD  Electrophysiologist:  None   Referring MD: [name], MD   No chief complaint on file. Follow-up cardiomyopathy  History of Present Illness:    Kara Hanson is a 64 y.o. female with cardiomyopathy, 40 % with normal coronary arteries on cath .  Prior Covid pneumonia.  She has EMT visits at her house.  Helping her with meals as well.  Excellent.  Overall she feels as though she has been quite stable.  She is taking her Lasix, carvedilol.  Wearing home oxygen.   Nonsmoker, no alcohol, no drug use  Spinal stenosis does limit her walking.  Past Medical History:  Diagnosis Date  . Acute CHF (congestive heart failure) ([place]) 09/24/2018  . Acute on chronic combined systolic and diastolic CHF (congestive heart failure) ([place]) 11/27/2017  . Acute on chronic respiratory failure with hypoxia ([place]) 03/20/2018  . Acute renal failure superimposed on stage 3 chronic kidney disease ([place])   . Acute respiratory distress 11/27/2017  . Acute respiratory failure with hypoxia ([place]) 02/09/2019  . Acute systolic CHF (congestive heart failure) ([place]) 05/19/2019  . AKI (acute kidney injury) ([place]) 11/27/2017  . Atelectasis   . Bulging lumbar disc 11/14/2017  . CAP (community acquired pneumonia) 10/31/2018  . Chest pain 06/17/2016  . CHF (congestive heart failure) ([place])   . Chronic diastolic CHF (congestive heart failure) ([place]) 07/20/2018  . Chronic respiratory failure ([place])   . CKD (chronic kidney disease), stage III 03/20/2018  . Controlled type 2 diabetes mellitus with hyperglycemia ([place]) 04/23/2016  . Controlled type 2 diabetes with neuropathy ([place]) 03/20/2018  . COVID-19 virus infection 07/20/2018  . CVA (cerebral vascular accident) ([place])   . Diabetes mellitus without complication ([place])   . Dyspnea   . Elevated troponin 04/18/2019  .  Epigastric pain   . Gout 11/14/2017  . Hypercholesteremia   . Hyperlipidemia LDL goal <70 09/30/2016  . Hypertension   . Myocardial infarction ([place])   . Normochromic normocytic anemia 04/23/2016  . NSTEMI (non-ST elevated myocardial infarction) ([place]) 02/09/2019  . Pneumonia 11/01/2018  . Pneumonia due to COVID-19 virus 07/20/2018  . S/P thoracentesis   . SIRS (systemic inflammatory response syndrome) ([place]) 07/25/2018  . Spinal stenosis   . Stress-induced cardiomyopathy 05/19/2016  . Syncope 04/23/2016  . Type 2 diabetes mellitus with diabetic neuropathy, unspecified ([place]) 05/06/2016  . Vertigo 05/06/2016  . Vitamin D deficiency 04/02/2018    Past Surgical History:  Procedure Laterality Date  . BIOPSY  01/27/2019   Procedure: BIOPSY;  Surgeon: Otis Brace, MD;  Location: [facility];  Service: Gastroenterology;;  . BLADDER SURGERY    . CARDIAC CATHETERIZATION N/A 04/25/2016   Procedure: Left Heart Cath and Coronary Angiography;  Surgeon: Lorretta Harp, MD;  Location: [facility];  Service: Cardiovascular;  Laterality: N/A;  . CARDIAC CATHETERIZATION  02/11/2019  . CESAREAN SECTION    . CHOLECYSTECTOMY    . ESOPHAGOGASTRODUODENOSCOPY (EGD) WITH PROPOFOL N/A 01/27/2019   Procedure: ESOPHAGOGASTRODUODENOSCOPY (EGD) WITH PROPOFOL;  Surgeon: Otis Brace, MD;  Location: [facility];  Service: Gastroenterology;  Laterality: N/A;  . RIGHT/LEFT HEART CATH AND CORONARY ANGIOGRAPHY N/A 02/11/2019   Procedure: RIGHT/LEFT HEART CATH AND CORONARY ANGIOGRAPHY;  Surgeon: Jolaine Artist, MD;  Location: [facility];  Service: Cardiovascular;  Laterality: N/A;  . VIDEO BRONCHOSCOPY N/A 02/01/2019  Procedure: VIDEO BRONCHOSCOPY WITHOUT FLUORO;  Surgeon: Candee Furbish, MD;  Location: [facility];  Service: Endoscopy;  Laterality: N/A;    Current Medications: Current Meds  Medication Sig  . Accu-Chek FastClix Lancets MISC USE AS DIRECTED TO TEST BLOOD SUGAR THREE TIMES  DAILY  . allopurinol (ZYLOPRIM) 300 MG tablet Take 300 mg by mouth daily.  Marland Kitchen aspirin EC 81 MG tablet Take 81 mg by mouth daily.  Marland Kitchen atorvastatin (LIPITOR) 80 MG tablet Take 80 mg by mouth daily at 6 PM.  . Blood Glucose Monitoring Suppl (ACCU-CHEK GUIDE) w/Device KIT 1 each by Does not apply route 3 (three) times daily.  . carvedilol (COREG) 25 MG tablet Take 1 tablet (25 mg total) by mouth 2 (two) times daily with a meal.  . DULoxetine (CYMBALTA) 30 MG capsule Take 1 capsule (30 mg total) by mouth daily.  Marland Kitchen EASY COMFORT PEN NEEDLES 31G X 5 MM MISC USE FOUR TIMES PER DAY FOR INSULIN ADMINISTRATION  . ergocalciferol (VITAMIN D2) 1.25 MG (50000 UT) capsule Take 1 capsule by mouth once a week.  . furosemide (LASIX) 80 MG tablet Take 80 mg by mouth 2 (two) times daily.  Marland Kitchen gabapentin (NEURONTIN) 100 MG capsule Take 1 capsule (100 mg total) by mouth at bedtime.  Marland Kitchen glucose blood (ACCU-CHEK GUIDE) test strip USE AS DIRECTED TO TEST BLOOD SUGAR THREE TIMES DAILY  . insulin aspart (NOVOLOG) 100 UNIT/ML injection 0 to 12 units subcutaneously 3 times daily before meals as per sliding scale  . Insulin Glargine (BASAGLAR KWIKPEN) 100 UNIT/ML SOPN Inject 0.15 mLs (15 Units total) into the skin at bedtime.  Elmore Guise Device MISC Use as instructed 3 times daily  . lansoprazole (PREVACID) 15 MG capsule TAKE 1 CAPSULE (15 MG TOTAL) BY MOUTH DAILY.  Marland Kitchen metoCLOPramide (REGLAN) 5 MG tablet Take 1 tablet (5 mg total) by mouth 3 (three) times daily.  . Misc. Devices MISC Portable oxygen concentrator.  Diagnosis-chronic respiratory failure.  . Misc. Devices MISC Rollaor with seat. Dx: Congestive Heart Failure  . Misc. Devices MISC Scale; Dx - CHF  . Misc. [facility] bed.  Diagnosis CHF.  Lifetime use.  Weight 165 lbs.  . Multiple Vitamins-Minerals (MULTIVITAMIN WITH MINERALS) tablet Take 1 tablet by mouth daily.  . polyethylene glycol (MIRALAX / GLYCOLAX) 17 g packet Take 17 g by mouth daily as needed for  moderate constipation.  . sodium chloride (OCEAN) 0.65 % SOLN nasal spray Place 1 spray into both nostrils as needed for congestion.  . SUPER B COMPLEX/C CAPS Take 1 capsule by mouth daily.     Allergies:   Garlic, Latex, Morphine and related, and Other   Social History   Socioeconomic History  . Marital status: Widowed    Spouse name: Not on file  . Number of children: Not on file  . Years of education: Not on file  . Highest education level: Not on file  Occupational History  . Not on file  Tobacco Use  . Smoking status: Never Smoker  . Smokeless tobacco: Never Used  Substance and Sexual Activity  . Alcohol use: No  . Drug use: No  . Sexual activity: Not Currently    Birth control/protection: None  Other Topics Concern  . Not on file  Social History Narrative  . Not on file   Social Determinants of Health   Financial Resource Strain:   . Difficulty of Paying Living Expenses:   Food Insecurity:   . Worried About Estate manager/land agent  of Food in the Last Year:   . [place] in the Last Year:   Transportation Needs:   . Lack of Transportation (Medical):   Marland Kitchen Lack of Transportation (Non-Medical):   Physical Activity:   . Days of Exercise per Week:   . Minutes of Exercise per Session:   Stress:   . Feeling of Stress :   Social Connections:   . Frequency of Communication with Friends and Family:   . Frequency of Social Gatherings with Friends and Family:   . Attends Religious Services:   . Active Member of Clubs or Organizations:   . Attends Archivist Meetings:   Marland Kitchen Marital Status:      Family History: The patient's family history includes Diabetes Mellitus II in her father; Healthy in her mother; Stroke in her father.  ROS:   Please see the history of present illness.    No fevers chills nausea vomiting syncope bleeding all other systems reviewed and are negative.  EKGs/Labs/Other Studies Reviewed:    The following studies were reviewed today: ECHO  2021  1. Left ventricular ejection fraction, by visual estimation, is 40 to  45%. The left ventricle has mild to moderately decreased function. There  is mildly increased left ventricular wall thickness.  2. The left ventricle demonstrates global hypokinesis.  3. Global right ventricle has normal systolc function.The right  ventricular size is normal.  4. Aortic stenosis was incompletely evaluated but likely mild aortic  valve stenosis (MG 27mHg, AVA 1.3 cm^2, DI 0.45)  5. The inferior vena cava is normal in size with <50% respiratory  variability, suggesting right atrial pressure of 8 mmHg.   Cath 2020: 1. Mild non-obstructive CAD with small coronary arteries 2. Moderate to severe NICM with EF 30% by echo 3. No significant aortic valve gradient on pullback  4. Elevated filling pressures with normal cardiac output  EKG: 09/24/2018-sinus rhythm with T wave inversion inferolaterally  Recent Labs: 02/10/2019: ALT 10; Magnesium 1.9 02/27/2019: TSH 3.480 07/01/2019: B Natriuretic Peptide 1,267.1; BUN 42; Creatinine, Ser 1.77; Hemoglobin 10.2; Platelets 224; Potassium 3.8; Sodium 139  Recent Lipid Panel    Component Value Date/Time   CHOL 353 (H) 01/09/2019 0959   TRIG 108 02/01/2019 1717   HDL 52 01/09/2019 0959   CHOLHDL 6.8 (H) 01/09/2019 0959   CHOLHDL 6.6 03/24/2018 0551   VLDL 35 03/24/2018 0551   LDLCALC 251 (H) 01/09/2019 0959    Physical Exam:    VS:  BP 136/72   Pulse 80   Ht 4' 11"  (1.499 m)   Wt 151 lb (68.5 kg)   BMI 30.50 kg/m     Wt Readings from Last 3 Encounters:  07/18/19 151 lb (68.5 kg)  07/17/19 149 lb (67.6 kg)  07/10/19 148 lb (67.1 kg)     GEN:  Well nourished, well developed in no acute distress HEENT: Normal NECK: No JVD; No carotid bruits LYMPHATICS: No lymphadenopathy CARDIAC: RRR, 1/6 SM,no rubs, gallops RESPIRATORY:  Clear to auscultation without rales, wheezing or rhonchi  ABDOMEN: Soft, non-tender, non-distended MUSCULOSKELETAL:   1-2+ BLE edema; No deformity  SKIN: Warm and dry NEUROLOGIC:  Alert and oriented x 3 PSYCHIATRIC:  Normal affect   ASSESSMENT:    1. Chronic systolic heart failure ([place]   2. Essential hypertension   3. Pure hypercholesterolemia    PLAN:    In order of problems listed above:   Chronic systolic heart failure - Normal coronary arteries.  Return to normal  of ejection fraction at one point but back down to 40%.  Continue with current regimen including carvedilol.  Unable to take Entresto or ARB because of renal dysfunction. -Seems fairly stable currently.  Her Lasix seems to be working.  Maintaining weight.  She did state that the as needed metolazone at times really works well.  She does not have any at this time at home.  I would like for her to try to use the Lasix only if possible.   Essential hypertension - Continue with current blood pressure regimen.    COVID-19 related pneumonia in April 2020. -On home O2 currently.  Abdominal discomfort with deep breath -She is seeing gastroenterology soon.  Difficult to control diabetes with hypertension -Per primary team.  Medication Adjustments/Labs and Tests Ordered: Current medicines are reviewed at length with the patient today.  Concerns regarding medicines are outlined above.  No orders of the defined types were placed in this encounter.  No orders of the defined types were placed in this encounter.   Patient Instructions  Medication Instructions:  The current medical regimen is effective;  continue present plan and medications.  *If you need a refill on your cardiac medications before your next appointment, please call your pharmacy*  Follow-Up: At [facility], you and your health needs are our priority.  As part of our continuing mission to provide you with exceptional heart care, we have created designated Provider Care Teams.  These Care Teams include your primary Cardiologist (physician) and Advanced Practice  Providers (APPs -  Physician Assistants and Nurse Practitioners) who all work together to provide you with the care you need, when you need it.  We recommend signing up for the patient portal called "MyChart".  Sign up information is provided on this After Visit Summary.  MyChart is used to connect with patients for Virtual Visits (Telemedicine).  Patients are able to view lab/test results, encounter notes, upcoming appointments, etc.  Non-urgent messages can be sent to your provider as well.   To learn more about what you can do with MyChart, go to [email or web address].    Your next appointment:   3 month(s)  The format for your next appointment:   In Person  Provider:   Truitt Merle, NP  Thank you for choosing [facility]!!        Signed, Candee Furbish, MD  07/18/2019 4:35 PM    [place]

## 2019-07-18 NOTE — Telephone Encounter (Signed)
Made contact with patient's daughter. Informed her of upcoming appointment today, she states the patient has transportation ready. Also informed patient and daughter of appointment on May 11th with [facility] at 0900. I informed patient to contact [facility] to schedule appointment herself as they would not allow me to due to her being a new patient. Call complete.

## 2019-07-18 NOTE — Patient Instructions (Signed)
Medication Instructions:  The current medical regimen is effective;  continue present plan and medications.  *If you need a refill on your cardiac medications before your next appointment, please call your pharmacy*  Follow-Up: At [facility], you and your health needs are our priority.  As part of our continuing mission to provide you with exceptional heart care, we have created designated Provider Care Teams.  These Care Teams include your primary Cardiologist (physician) and Advanced Practice Providers (APPs -  Physician Assistants and Nurse Practitioners) who all work together to provide you with the care you need, when you need it.  We recommend signing up for the patient portal called "MyChart".  Sign up information is provided on this After Visit Summary.  MyChart is used to connect with patients for Virtual Visits (Telemedicine).  Patients are able to view lab/test results, encounter notes, upcoming appointments, etc.  Non-urgent messages can be sent to your provider as well.   To learn more about what you can do with MyChart, go to [email or web address].    Your next appointment:   3 month(s)  The format for your next appointment:   In Person  Provider:   Truitt Merle, NP  Thank you for choosing [facility]!!

## 2019-07-24 ENCOUNTER — Other Ambulatory Visit: Payer: Self-pay

## 2019-07-24 NOTE — Progress Notes (Signed)
Paramedicine Encounter    Patient ID: Kara Hanson, female    DOB: 29-Jan-1956, 64 y.o.   MRN: [id number]   Patient Care Team: [name], MD as PCP - General (Family Medicine) Jerline Pain, MD as PCP - Cardiology (Cardiology)  Patient Active Problem List   Diagnosis Date Noted  . Acute systolic CHF (congestive heart failure) ([place]) 05/19/2019  . Acute exacerbation of CHF (congestive heart failure) ([place]) 04/18/2019  . Elevated troponin 04/18/2019  . Acute respiratory failure with hypoxia ([place]) 02/09/2019  . NSTEMI (non-ST elevated myocardial infarction) ([place]) 02/09/2019  . S/P thoracentesis   . Atelectasis   . Chronic respiratory failure ([place])   . Acute renal failure superimposed on stage 3 chronic kidney disease ([place])   . Epigastric pain   . CHF (congestive heart failure) ([place]) 01/23/2019  . CAP (community acquired pneumonia) 10/31/2018  . Acute CHF (congestive heart failure) ([place]) 09/24/2018  . SIRS (systemic inflammatory response syndrome) ([place]) 07/25/2018  . COVID-19 virus infection 07/20/2018  . Chronic diastolic CHF (congestive heart failure) ([place]) 07/20/2018  . Pneumonia due to COVID-19 virus 07/20/2018  . Vitamin D deficiency 04/02/2018  . Spinal stenosis 03/29/2018  . Acute on chronic respiratory failure with hypoxia ([place]) 03/20/2018  . Controlled type 2 diabetes with neuropathy ([place]) 03/20/2018  . CKD (chronic kidney disease), stage III 03/20/2018  . Acute on chronic combined systolic and diastolic CHF (congestive heart failure) ([place]) 11/27/2017  . AKI (acute kidney injury) ([place]) 11/27/2017  . Acute respiratory distress 11/27/2017  . Bulging lumbar disc 11/14/2017  . Gout 11/14/2017  . Hyperlipidemia LDL goal <70 09/30/2016  . Chest pain 06/17/2016  . Stress-induced cardiomyopathy 05/19/2016  . Type 2 diabetes mellitus with diabetic neuropathy, unspecified ([place]) 05/06/2016  . Vertigo 05/06/2016  . Controlled type 2 diabetes mellitus with hyperglycemia ([place])  04/23/2016  . Hypertension 04/23/2016  . Normochromic normocytic anemia 04/23/2016  . Syncope 04/23/2016    Current Outpatient Medications:  .  allopurinol (ZYLOPRIM) 300 MG tablet, Take 300 mg by mouth daily., Disp: , Rfl:  .  aspirin EC 81 MG tablet, Take 81 mg by mouth daily., Disp: , Rfl:  .  atorvastatin (LIPITOR) 80 MG tablet, Take 80 mg by mouth daily at 6 PM., Disp: , Rfl:  .  carvedilol (COREG) 25 MG tablet, Take 1 tablet (25 mg total) by mouth 2 (two) times daily with a meal., Disp: 60 tablet, Rfl: 3 .  DULoxetine (CYMBALTA) 30 MG capsule, Take 1 capsule (30 mg total) by mouth daily., Disp: 30 capsule, Rfl: 3 .  EASY COMFORT PEN NEEDLES 31G X 5 MM MISC, USE FOUR TIMES PER DAY FOR INSULIN ADMINISTRATION, Disp: 200 each, Rfl: 12 .  furosemide (LASIX) 80 MG tablet, Take 80 mg by mouth 2 (two) times daily., Disp: , Rfl:  .  gabapentin (NEURONTIN) 100 MG capsule, Take 1 capsule (100 mg total) by mouth at bedtime., Disp:  , Rfl:  .  lansoprazole (PREVACID) 15 MG capsule, TAKE 1 CAPSULE (15 MG TOTAL) BY MOUTH DAILY., Disp: 30 capsule, Rfl: 2 .  metoCLOPramide (REGLAN) 5 MG tablet, Take 1 tablet (5 mg total) by mouth 3 (three) times daily., Disp: 90 tablet, Rfl: 1 .  Misc. Devices MISC, Portable oxygen concentrator.  Diagnosis-chronic respiratory failure., Disp: 1 each, Rfl: 0 .  Misc. Devices [facility] bed.  Diagnosis CHF.  Lifetime use.  Weight 165 lbs., Disp: 1 each, Rfl: 0 .  Accu-Chek FastClix Lancets MISC, USE AS DIRECTED TO TEST BLOOD  SUGAR THREE TIMES DAILY, Disp: 102 each, Rfl: 12 .  Blood Glucose Monitoring Suppl (ACCU-CHEK GUIDE) w/Device KIT, 1 each by Does not apply route 3 (three) times daily., Disp: 1 kit, Rfl: 0 .  ergocalciferol (VITAMIN D2) 1.25 MG (50000 UT) capsule, Take 1 capsule by mouth once a week., Disp: , Rfl:  .  glucose blood (ACCU-CHEK GUIDE) test strip, USE AS DIRECTED TO TEST BLOOD SUGAR THREE TIMES DAILY, Disp: 100 each, Rfl: 12 .  insulin aspart  (NOVOLOG) 100 UNIT/ML injection, 0 to 12 units subcutaneously 3 times daily before meals as per sliding scale, Disp: 30 mL, Rfl: 3 .  Insulin Glargine (BASAGLAR KWIKPEN) 100 UNIT/ML SOPN, Inject 0.15 mLs (15 Units total) into the skin at bedtime., Disp: 30 mL, Rfl: 3 .  Lancet Device MISC, Use as instructed 3 times daily, Disp: 1 each, Rfl: 0 .  Misc. Devices MISC, Rollaor with seat. Dx: Congestive Heart Failure (Patient not taking: Reported on 07/24/2019), Disp: 1 each, Rfl: 0 .  Misc. Devices MISC, Scale; Dx - CHF, Disp: 1 each, Rfl: 0 .  Multiple Vitamins-Minerals (MULTIVITAMIN WITH MINERALS) tablet, Take 1 tablet by mouth daily., Disp: , Rfl:  .  polyethylene glycol (MIRALAX / GLYCOLAX) 17 g packet, Take 17 g by mouth daily as needed for moderate constipation. (Patient not taking: Reported on 07/24/2019), Disp: , Rfl:  .  sodium chloride (OCEAN) 0.65 % SOLN nasal spray, Place 1 spray into both nostrils as needed for congestion., Disp: , Rfl:  .  SUPER B COMPLEX/C CAPS, Take 1 capsule by mouth daily., Disp: , Rfl:  Allergies  Allergen Reactions  . Garlic Shortness Of Breath, Itching and Swelling    Raw*  Hand itching and swelling  . Latex Itching  . Morphine And Related Itching and Other (See Comments)    Headache   . Other Itching    Reaction to newspaper ink -itching and headache     Social History   Socioeconomic History  . Marital status: Widowed    Spouse name: Not on file  . Number of children: Not on file  . Years of education: Not on file  . Highest education level: Not on file  Occupational History  . Not on file  Tobacco Use  . Smoking status: Never Smoker  . Smokeless tobacco: Never Used  Substance and Sexual Activity  . Alcohol use: No  . Drug use: No  . Sexual activity: Not Currently    Birth control/protection: None  Other Topics Concern  . Not on file  Social History Narrative  . Not on file   Social Determinants of Health   Financial Resource Strain:    . Difficulty of Paying Living Expenses:   Food Insecurity:   . Worried About Charity fundraiser in the Last Year:   . Arboriculturist in the Last Year:   Transportation Needs:   . Film/video editor (Medical):   Marland Kitchen Lack of Transportation (Non-Medical):   Physical Activity:   . Days of Exercise per Week:   . Minutes of Exercise per Session:   Stress:   . Feeling of Stress :   Social Connections:   . Frequency of Communication with Friends and Family:   . Frequency of Social Gatherings with Friends and Family:   . Attends Religious Services:   . Active Member of Clubs or Organizations:   . Attends Archivist Meetings:   Marland Kitchen Marital Status:   Intimate Partner Violence:   .  Fear of Current or Ex-Partner:   . Emotionally Abused:   Marland Kitchen Physically Abused:   . Sexually Abused:     Physical Exam Vitals reviewed.  Constitutional:      Appearance: She is normal weight.  HENT:     Head: Normocephalic.     Nose: Nose normal.     Mouth/Throat:     Mouth: Mucous membranes are moist.     Pharynx: Oropharynx is clear.  Eyes:     Pupils: Pupils are equal, round, and reactive to light.  Cardiovascular:     Rate and Rhythm: Normal rate and regular rhythm.     Pulses: Normal pulses.     Heart sounds: Normal heart sounds.  Pulmonary:     Breath sounds: Normal breath sounds.     Comments: Increased effort while ambulating  Abdominal:     General: There is distension.     Tenderness: There is abdominal tenderness.  Musculoskeletal:        General: Normal range of motion.     Cervical back: Normal range of motion and neck supple.     Right lower leg: No edema.     Left lower leg: No edema.  Skin:    General: Skin is warm and dry.     Capillary Refill: Capillary refill takes less than 2 seconds.  Neurological:     Mental Status: She is alert. Mental status is at baseline.  Psychiatric:        Mood and Affect: Mood normal.        Behavior: Behavior normal.     Arrived  for home visit for Tyanne who was alert and oriented ambulating in her home appearing to be short of breath while walking. Edom stated she feels like it is related to her abdomen. Abdomen was distended with some tenderness to the touch. Loralei stated she has had regular bouts of bowel movements some including diarrhea. These findings are normal for patient over last several visits. GI follow up coming up in May. Vitals were obtained and are as noted. Medications were reviewed and confirmed. Akera missed several doses of medications including AM, EVE and BEDTIME slots. Pills were placed in pill box accordingly. I set alarms at 0930, 1800 and 2200 on her phone to assist with remembering to take her medicines. She agreed with same. Appointments wrote down for her with dates and times and locations. Patient also noted to get her hospital bed delivered from [facility] today around 1000. Bed was set up and made up with linen on my arrival. Norman was pleased. She reports she needs a walker, I will be working on same. Home visit complete.   Refills -Duloxetine -Carvedilol -Lansoprazole -Vit D   Weight- 145lbs Last Weight- 151lbs  CBG- 184     Future Appointments  Date Time Provider [place]  08/19/2019  2:30 PM Tat, Eustace Quail, DO [facility] None  10/22/2019  2:45 PM Burtis Junes, NP [facility]     ACTION: Home visit completed Next visit planned for one week

## 2019-07-25 ENCOUNTER — Telehealth: Payer: Self-pay

## 2019-07-25 ENCOUNTER — Telehealth (HOSPITAL_COMMUNITY): Payer: Self-pay

## 2019-07-25 NOTE — Telephone Encounter (Signed)
Call received from [facility] confirming that the patient has been approved for services.

## 2019-07-25 NOTE — Telephone Encounter (Signed)
Spoke to patient to deliver walker provided by [facility]. Walker delivered successfully.

## 2019-07-30 ENCOUNTER — Ambulatory Visit: Payer: Medicare Other | Admitting: Neurology

## 2019-07-31 ENCOUNTER — Other Ambulatory Visit: Payer: Self-pay | Admitting: Family Medicine

## 2019-07-31 ENCOUNTER — Other Ambulatory Visit: Payer: Self-pay

## 2019-07-31 NOTE — Progress Notes (Signed)
Paramedicine Encounter    Patient ID: Kara Hanson, female    DOB: 11-May-1955, 64 y.o.   MRN: [id number]   Patient Care Team: Kara Rakes, MD as PCP - General (Family Medicine) Kara Pain, MD as PCP - Cardiology (Cardiology)  Patient Active Problem List   Diagnosis Date Noted  . Acute systolic CHF (congestive heart failure) ([place]) 05/19/2019  . Acute exacerbation of CHF (congestive heart failure) ([place]) 04/18/2019  . Elevated troponin 04/18/2019  . Acute respiratory failure with hypoxia ([place]) 02/09/2019  . NSTEMI (non-ST elevated myocardial infarction) ([place]) 02/09/2019  . S/P thoracentesis   . Atelectasis   . Chronic respiratory failure ([place])   . Acute renal failure superimposed on stage 3 chronic kidney disease ([place])   . Epigastric Hanson   . CHF (congestive heart failure) ([place]) 01/23/2019  . CAP (community acquired pneumonia) 10/31/2018  . Acute CHF (congestive heart failure) ([place]) 09/24/2018  . SIRS (systemic inflammatory response syndrome) ([place]) 07/25/2018  . COVID-19 virus infection 07/20/2018  . Chronic diastolic CHF (congestive heart failure) ([place]) 07/20/2018  . Pneumonia due to COVID-19 virus 07/20/2018  . Vitamin D deficiency 04/02/2018  . Spinal stenosis 03/29/2018  . Acute on chronic respiratory failure with hypoxia ([place]) 03/20/2018  . Controlled type 2 diabetes with neuropathy ([place]) 03/20/2018  . CKD (chronic kidney disease), stage III 03/20/2018  . Acute on chronic combined systolic and diastolic CHF (congestive heart failure) ([place]) 11/27/2017  . AKI (acute kidney injury) ([place]) 11/27/2017  . Acute respiratory distress 11/27/2017  . Bulging lumbar disc 11/14/2017  . Gout 11/14/2017  . Hyperlipidemia LDL goal <70 09/30/2016  . Chest Hanson 06/17/2016  . Stress-induced cardiomyopathy 05/19/2016  . Type 2 diabetes mellitus with diabetic neuropathy, unspecified ([place]) 05/06/2016  . Vertigo 05/06/2016  . Controlled type 2 diabetes mellitus with hyperglycemia ([place])  04/23/2016  . Hypertension 04/23/2016  . Normochromic normocytic anemia 04/23/2016  . Syncope 04/23/2016    Current Outpatient Medications:  .  allopurinol (ZYLOPRIM) 300 MG tablet, Take 300 mg by mouth daily., Disp: , Rfl:  .  aspirin EC 81 MG tablet, Take 81 mg by mouth daily., Disp: , Rfl:  .  atorvastatin (LIPITOR) 80 MG tablet, Take 80 mg by mouth daily at 6 PM., Disp: , Rfl:  .  carvedilol (COREG) 25 MG tablet, Take 1 tablet (25 mg total) by mouth 2 (two) times daily with a meal., Disp: 60 tablet, Rfl: 3 .  DULoxetine (CYMBALTA) 30 MG capsule, Take 1 capsule (30 mg total) by mouth daily., Disp: 30 capsule, Rfl: 3 .  ergocalciferol (VITAMIN D2) 1.25 MG (50000 UT) capsule, Take 1 capsule by mouth once a week., Disp: , Rfl:  .  furosemide (LASIX) 80 MG tablet, Take 80 mg by mouth 2 (two) times daily., Disp: , Rfl:  .  gabapentin (NEURONTIN) 100 MG capsule, Take 1 capsule (100 mg total) by mouth at bedtime., Disp:  , Rfl:  .  insulin aspart (NOVOLOG) 100 UNIT/ML injection, 0 to 12 units subcutaneously 3 times daily before meals as per sliding scale, Disp: 30 mL, Rfl: 3 .  Insulin Glargine (BASAGLAR KWIKPEN) 100 UNIT/ML SOPN, Inject 0.15 mLs (15 Units total) into the skin at bedtime., Disp: 30 mL, Rfl: 3 .  Lancet Device MISC, Use as instructed 3 times daily, Disp: 1 each, Rfl: 0 .  lansoprazole (PREVACID) 15 MG capsule, TAKE 1 CAPSULE (15 MG TOTAL) BY MOUTH DAILY., Disp: 30 capsule, Rfl: 2 .  metoCLOPramide (REGLAN) 5 MG tablet, Take 1  tablet (5 mg total) by mouth 3 (three) times daily., Disp: 90 tablet, Rfl: 1 .  Misc. Devices MISC, Portable oxygen concentrator.  Diagnosis-chronic respiratory failure., Disp: 1 each, Rfl: 0 .  Misc. Devices MISC, Scale; Dx - CHF, Disp: 1 each, Rfl: 0 .  Misc. Devices [facility] bed.  Diagnosis CHF.  Lifetime use.  Weight 165 lbs., Disp: 1 each, Rfl: 0 .  Accu-Chek FastClix Lancets MISC, USE AS DIRECTED TO TEST BLOOD SUGAR THREE TIMES DAILY (Patient not  taking: Reported on 07/31/2019), Disp: 102 each, Rfl: 12 .  Blood Glucose Monitoring Suppl (ACCU-CHEK GUIDE) w/Device KIT, 1 each by Does not apply route 3 (three) times daily. (Patient not taking: Reported on 07/31/2019), Disp: 1 kit, Rfl: 0 .  EASY COMFORT PEN NEEDLES 31G X 5 MM MISC, USE FOUR TIMES PER DAY FOR INSULIN ADMINISTRATION (Patient not taking: Reported on 07/31/2019), Disp: 200 each, Rfl: 12 .  glucose blood (ACCU-CHEK GUIDE) test strip, USE AS DIRECTED TO TEST BLOOD SUGAR THREE TIMES DAILY (Patient not taking: Reported on 07/31/2019), Disp: 100 each, Rfl: 12 .  Misc. Devices MISC, Rollaor with seat. Dx: Congestive Heart Failure (Patient not taking: Reported on 07/31/2019), Disp: 1 each, Rfl: 0 .  Multiple Vitamins-Minerals (MULTIVITAMIN WITH MINERALS) tablet, Take 1 tablet by mouth daily., Disp: , Rfl:  .  polyethylene glycol (MIRALAX / GLYCOLAX) 17 g packet, Take 17 g by mouth daily as needed for moderate constipation. (Patient not taking: Reported on 07/24/2019), Disp: , Rfl:  .  sodium chloride (OCEAN) 0.65 % SOLN nasal spray, Place 1 spray into both nostrils as needed for congestion., Disp: , Rfl:  .  SUPER B COMPLEX/C CAPS, Take 1 capsule by mouth daily., Disp: , Rfl:  Allergies  Allergen Reactions  . Garlic Shortness Of Breath, Itching and Swelling    Raw*  Hand itching and swelling  . Latex Itching  . Morphine And Related Itching and Other (See Comments)    Headache   . Other Itching    Reaction to newspaper ink -itching and headache     Social History   Socioeconomic History  . Marital status: Widowed    Spouse name: Not on file  . Number of children: Not on file  . Years of education: Not on file  . Highest education level: Not on file  Occupational History  . Not on file  Tobacco Use  . Smoking status: Never Smoker  . Smokeless tobacco: Never Used  Substance and Sexual Activity  . Alcohol use: No  . Drug use: No  . Sexual activity: Not Currently    Birth  control/protection: None  Other Topics Concern  . Not on file  Social History Narrative  . Not on file   Social Determinants of Health   Financial Resource Strain:   . Difficulty of Paying Living Expenses:   Food Insecurity:   . Worried About Charity fundraiser in the Last Year:   . Arboriculturist in the Last Year:   Transportation Needs:   . Film/video editor (Medical):   Marland Kitchen Lack of Transportation (Non-Medical):   Physical Activity:   . Days of Exercise per Week:   . Minutes of Exercise per Session:   Stress:   . Feeling of Stress :   Social Connections:   . Frequency of Communication with Friends and Family:   . Frequency of Social Gatherings with Friends and Family:   . Attends Religious Services:   . Active  Member of Clubs or Organizations:   . Attends Archivist Meetings:   Marland Kitchen Marital Status:   Intimate Partner Violence:   . Fear of Current or Ex-Partner:   . Emotionally Abused:   Marland Kitchen Physically Abused:   . Sexually Abused:     Physical Exam Vitals reviewed.  Constitutional:      Appearance: She is normal weight.  HENT:     Head: Normocephalic.     Nose: Nose normal.     Mouth/Throat:     Mouth: Mucous membranes are moist.     Pharynx: Oropharynx is clear.  Eyes:     Pupils: Pupils are equal, round, and reactive to light.  Cardiovascular:     Rate and Rhythm: Normal rate and regular rhythm.     Pulses: Normal pulses.     Heart sounds: Normal heart sounds.  Pulmonary:     Effort: Pulmonary effort is normal.     Breath sounds: Normal breath sounds.  Abdominal:     General: There is distension.  Musculoskeletal:        General: Normal range of motion.     Cervical back: Normal range of motion and neck supple.     Right lower leg: No edema.     Left lower leg: No edema.  Skin:    General: Skin is warm and dry.     Capillary Refill: Capillary refill takes less than 2 seconds.  Neurological:     General: No focal deficit present.      Mental Status: She is alert.  Psychiatric:        Mood and Affect: Mood normal.    Arrived for home visit or Elyn who was ambulating in her home, without increased shortness of breath and without her walker. I instructed her it is in her best interest to utilize her walker she stated, "okay I will use it". Patient was seated at the kitchen table where vitals were obtained and are as noted.  CBG- 134  Patient's MOM'S meals were unboxed and placed in fridge. Patient was educated on meals and what was in each one. Patient was grateful for them.   Medications were reviewed and confirmed. Pill box filled accordingly. Patient will be out of Lansoprazole by Sat. AM. I will call in same to [facility].   Patient expressed she feels like she is tired a lot and sleeps more often that she wants too. I will send this information over to Dr. Margarita Rana.   Harlea has been maintaining her weight well and no increased edema noted. Analicia stated [facility] is coming out once a week for now. Alysabeth stated this is helping her move more while at home.   I will see Sheilah in one week. Home visit complete.   Refills: -Lasix  -Lansoprazole        Future Appointments  Date Time Provider [place]  08/19/2019  2:30 PM Tat, Eustace Quail, DO [facility] None  10/22/2019  2:45 PM Burtis Junes, NP [facility]     ACTION: Home visit completed Next visit planned for one week

## 2019-08-07 ENCOUNTER — Other Ambulatory Visit (HOSPITAL_COMMUNITY): Payer: Self-pay

## 2019-08-07 NOTE — Progress Notes (Signed)
Paramedicine Encounter    Patient ID: Kara Hanson, female    DOB: 1955/05/07, 64 y.o.   MRN: [id number]   Patient Care Team: [name], MD as PCP - General (Family Medicine) Jerline Pain, MD as PCP - Cardiology (Cardiology)  Patient Active Problem List   Diagnosis Date Noted  . Acute systolic CHF (congestive heart failure) ([place]) 05/19/2019  . Acute exacerbation of CHF (congestive heart failure) ([place]) 04/18/2019  . Elevated troponin 04/18/2019  . Acute respiratory failure with hypoxia ([place]) 02/09/2019  . NSTEMI (non-ST elevated myocardial infarction) ([place]) 02/09/2019  . S/P thoracentesis   . Atelectasis   . Chronic respiratory failure ([place])   . Acute renal failure superimposed on stage 3 chronic kidney disease ([place])   . Epigastric pain   . CHF (congestive heart failure) ([place]) 01/23/2019  . CAP (community acquired pneumonia) 10/31/2018  . Acute CHF (congestive heart failure) ([place]) 09/24/2018  . SIRS (systemic inflammatory response syndrome) ([place]) 07/25/2018  . COVID-19 virus infection 07/20/2018  . Chronic diastolic CHF (congestive heart failure) ([place]) 07/20/2018  . Pneumonia due to COVID-19 virus 07/20/2018  . Vitamin D deficiency 04/02/2018  . Spinal stenosis 03/29/2018  . Acute on chronic respiratory failure with hypoxia ([place]) 03/20/2018  . Controlled type 2 diabetes with neuropathy ([place]) 03/20/2018  . CKD (chronic kidney disease), stage III 03/20/2018  . Acute on chronic combined systolic and diastolic CHF (congestive heart failure) ([place]) 11/27/2017  . AKI (acute kidney injury) ([place]) 11/27/2017  . Acute respiratory distress 11/27/2017  . Bulging lumbar disc 11/14/2017  . Gout 11/14/2017  . Hyperlipidemia LDL goal <70 09/30/2016  . Chest pain 06/17/2016  . Stress-induced cardiomyopathy 05/19/2016  . Type 2 diabetes mellitus with diabetic neuropathy, unspecified ([place]) 05/06/2016  . Vertigo 05/06/2016  . Controlled type 2 diabetes mellitus with hyperglycemia ([place])  04/23/2016  . Hypertension 04/23/2016  . Normochromic normocytic anemia 04/23/2016  . Syncope 04/23/2016    Current Outpatient Medications:  .  allopurinol (ZYLOPRIM) 300 MG tablet, Take 300 mg by mouth daily., Disp: , Rfl:  .  aspirin EC 81 MG tablet, Take 81 mg by mouth daily., Disp: , Rfl:  .  atorvastatin (LIPITOR) 80 MG tablet, Take 80 mg by mouth daily at 6 PM., Disp: , Rfl:  .  carvedilol (COREG) 25 MG tablet, Take 1 tablet (25 mg total) by mouth 2 (two) times daily with a meal., Disp: 60 tablet, Rfl: 3 .  DULoxetine (CYMBALTA) 30 MG capsule, Take 1 capsule (30 mg total) by mouth daily., Disp: 30 capsule, Rfl: 3 .  EASY COMFORT PEN NEEDLES 31G X 5 MM MISC, USE FOUR TIMES PER DAY FOR INSULIN ADMINISTRATION, Disp: 200 each, Rfl: 12 .  ergocalciferol (VITAMIN D2) 1.25 MG (50000 UT) capsule, Take 1 capsule by mouth once a week., Disp: , Rfl:  .  furosemide (LASIX) 80 MG tablet, TAKE 1 TABLET (80 MG TOTAL) BY MOUTH 2 (TWO) TIMES DAILY., Disp: 60 tablet, Rfl: 0 .  gabapentin (NEURONTIN) 100 MG capsule, Take 1 capsule (100 mg total) by mouth at bedtime., Disp:  , Rfl:  .  insulin aspart (NOVOLOG) 100 UNIT/ML injection, 0 to 12 units subcutaneously 3 times daily before meals as per sliding scale, Disp: 30 mL, Rfl: 3 .  Insulin Glargine (BASAGLAR KWIKPEN) 100 UNIT/ML SOPN, Inject 0.15 mLs (15 Units total) into the skin at bedtime., Disp: 30 mL, Rfl: 3 .  metoCLOPramide (REGLAN) 5 MG tablet, Take 1 tablet (5 mg total) by mouth 3 (three) times  daily., Disp: 90 tablet, Rfl: 1 .  Misc. Devices MISC, Portable oxygen concentrator.  Diagnosis-chronic respiratory failure., Disp: 1 each, Rfl: 0 .  Misc. Devices MISC, Rollaor with seat. Dx: Congestive Heart Failure, Disp: 1 each, Rfl: 0 .  Misc. Devices MISC, Scale; Dx - CHF, Disp: 1 each, Rfl: 0 .  Misc. Devices [facility] bed.  Diagnosis CHF.  Lifetime use.  Weight 165 lbs., Disp: 1 each, Rfl: 0 .  sodium chloride (OCEAN) 0.65 % SOLN nasal spray,  Place 1 spray into both nostrils as needed for congestion., Disp: , Rfl:  .  Accu-Chek FastClix Lancets MISC, USE AS DIRECTED TO TEST BLOOD SUGAR THREE TIMES DAILY (Patient not taking: Reported on 07/31/2019), Disp: 102 each, Rfl: 12 .  Blood Glucose Monitoring Suppl (ACCU-CHEK GUIDE) w/Device KIT, 1 each by Does not apply route 3 (three) times daily. (Patient not taking: Reported on 07/31/2019), Disp: 1 kit, Rfl: 0 .  glucose blood (ACCU-CHEK GUIDE) test strip, USE AS DIRECTED TO TEST BLOOD SUGAR THREE TIMES DAILY (Patient not taking: Reported on 07/31/2019), Disp: 100 each, Rfl: 12 .  Lancet Device MISC, Use as instructed 3 times daily (Patient not taking: Reported on 08/07/2019), Disp: 1 each, Rfl: 0 .  lansoprazole (PREVACID) 15 MG capsule, TAKE 1 CAPSULE (15 MG TOTAL) BY MOUTH DAILY. (Patient not taking: Reported on 08/07/2019), Disp: 30 capsule, Rfl: 2 .  Multiple Vitamins-Minerals (MULTIVITAMIN WITH MINERALS) tablet, Take 1 tablet by mouth daily., Disp: , Rfl:  .  polyethylene glycol (MIRALAX / GLYCOLAX) 17 g packet, Take 17 g by mouth daily as needed for moderate constipation. (Patient not taking: Reported on 07/24/2019), Disp: , Rfl:  .  SUPER B COMPLEX/C CAPS, Take 1 capsule by mouth daily., Disp: , Rfl:  Allergies  Allergen Reactions  . Garlic Shortness Of Breath, Itching and Swelling    Raw*  Hand itching and swelling  . Latex Itching  . Morphine And Related Itching and Other (See Comments)    Headache   . Other Itching    Reaction to newspaper ink -itching and headache     Social History   Socioeconomic History  . Marital status: Widowed    Spouse name: Not on file  . Number of children: Not on file  . Years of education: Not on file  . Highest education level: Not on file  Occupational History  . Not on file  Tobacco Use  . Smoking status: Never Smoker  . Smokeless tobacco: Never Used  Substance and Sexual Activity  . Alcohol use: No  . Drug use: No  . Sexual activity: Not  Currently    Birth control/protection: None  Other Topics Concern  . Not on file  Social History Narrative  . Not on file   Social Determinants of Health   Financial Resource Strain:   . Difficulty of Paying Living Expenses:   Food Insecurity:   . Worried About Charity fundraiser in the Last Year:   . Arboriculturist in the Last Year:   Transportation Needs:   . Film/video editor (Medical):   Marland Kitchen Lack of Transportation (Non-Medical):   Physical Activity:   . Days of Exercise per Week:   . Minutes of Exercise per Session:   Stress:   . Feeling of Stress :   Social Connections:   . Frequency of Communication with Friends and Family:   . Frequency of Social Gatherings with Friends and Family:   . Attends Religious Services:   .  Active Member of Clubs or Organizations:   . Attends Archivist Meetings:   Marland Kitchen Marital Status:   Intimate Partner Violence:   . Fear of Current or Ex-Partner:   . Emotionally Abused:   Marland Kitchen Physically Abused:   . Sexually Abused:     Physical Exam Vitals reviewed.  Constitutional:      Appearance: She is normal weight.  HENT:     Head: Normocephalic.     Nose: Nose normal.     Mouth/Throat:     Mouth: Mucous membranes are dry.  Eyes:     Pupils: Pupils are equal, round, and reactive to light.  Cardiovascular:     Rate and Rhythm: Normal rate and regular rhythm.     Pulses: Normal pulses.     Heart sounds: Normal heart sounds.  Pulmonary:     Effort: Pulmonary effort is normal.     Breath sounds: Normal breath sounds.  Abdominal:     General: Abdomen is flat.     Palpations: Abdomen is soft.  Musculoskeletal:        General: Normal range of motion.     Cervical back: Normal range of motion.     Right lower leg: No edema.     Left lower leg: No edema.  Skin:    General: Skin is warm and dry.     Capillary Refill: Capillary refill takes less than 2 seconds.  Neurological:     Mental Status: She is alert. Mental status is at  baseline.  Psychiatric:        Mood and Affect: Mood normal.         Future Appointments  Date Time Provider [place]  08/19/2019  2:30 PM Tat, Eustace Quail, DO [facility] None  10/22/2019  2:45 PM Burtis Junes, NP [facility]   Arrived for home visit for Shon who was alert and oriented ambulating in her room stating she has been resting today. Sojourner stated she has been able to get out of the house with PT this week as well as over the weekend she went out to dinner with her daughter and went to [facility] which is something she has not done in quite sometime. Whisper was praised for her accomplishments and continued improvements. Vitals were obtained and are as noted in report. Medications were reviewed and confirmed. Pill box was filled accordingly. Jaquay had no complaints and agreed with visit in one week.   [facility] called and pick up for May 11th appointment for [facility] is 0730-0800. Kelissa made aware of same. I will text her daughter as well.   Home visit complete.   Weight this week- 144lbs  Weight last week-147lbs   Refills: Reglan Lansoprazole     ACTION: Home visit completed Next visit planned for one week

## 2019-08-08 ENCOUNTER — Other Ambulatory Visit: Payer: Self-pay | Admitting: Family Medicine

## 2019-08-08 MED ORDER — METOCLOPRAMIDE HCL 5 MG PO TABS: 5.0000 mg | ORAL_TABLET | Freq: Three times a day (TID) | ORAL | 1 refills | Status: DC | PRN

## 2019-08-14 ENCOUNTER — Other Ambulatory Visit: Payer: Self-pay

## 2019-08-14 NOTE — Progress Notes (Signed)
Paramedicine Encounter    Patient ID: Kara Hanson, female    DOB: 1955/10/30, 64 y.o.   MRN: [id number]   Patient Care Team: [name], MD as PCP - General (Family Medicine) Jerline Pain, MD as PCP - Cardiology (Cardiology)  Patient Active Problem List   Diagnosis Date Noted  . Acute systolic CHF (congestive heart failure) ([place]) 05/19/2019  . Acute exacerbation of CHF (congestive heart failure) ([place]) 04/18/2019  . Elevated troponin 04/18/2019  . Acute respiratory failure with hypoxia ([place]) 02/09/2019  . NSTEMI (non-ST elevated myocardial infarction) ([place]) 02/09/2019  . S/P thoracentesis   . Atelectasis   . Chronic respiratory failure ([place])   . Acute renal failure superimposed on stage 3 chronic kidney disease ([place])   . Epigastric pain   . CHF (congestive heart failure) ([place]) 01/23/2019  . CAP (community acquired pneumonia) 10/31/2018  . Acute CHF (congestive heart failure) ([place]) 09/24/2018  . SIRS (systemic inflammatory response syndrome) ([place]) 07/25/2018  . COVID-19 virus infection 07/20/2018  . Chronic diastolic CHF (congestive heart failure) ([place]) 07/20/2018  . Pneumonia due to COVID-19 virus 07/20/2018  . Vitamin D deficiency 04/02/2018  . Spinal stenosis 03/29/2018  . Acute on chronic respiratory failure with hypoxia ([place]) 03/20/2018  . Controlled type 2 diabetes with neuropathy ([place]) 03/20/2018  . CKD (chronic kidney disease), stage III 03/20/2018  . Acute on chronic combined systolic and diastolic CHF (congestive heart failure) ([place]) 11/27/2017  . AKI (acute kidney injury) ([place]) 11/27/2017  . Acute respiratory distress 11/27/2017  . Bulging lumbar disc 11/14/2017  . Gout 11/14/2017  . Hyperlipidemia LDL goal <70 09/30/2016  . Chest pain 06/17/2016  . Stress-induced cardiomyopathy 05/19/2016  . Type 2 diabetes mellitus with diabetic neuropathy, unspecified ([place]) 05/06/2016  . Vertigo 05/06/2016  . Controlled type 2 diabetes mellitus with hyperglycemia ([place])  04/23/2016  . Hypertension 04/23/2016  . Normochromic normocytic anemia 04/23/2016  . Syncope 04/23/2016    Current Outpatient Medications:  .  allopurinol (ZYLOPRIM) 300 MG tablet, Take 300 mg by mouth daily., Disp: , Rfl:  .  aspirin EC 81 MG tablet, Take 81 mg by mouth daily., Disp: , Rfl:  .  atorvastatin (LIPITOR) 80 MG tablet, Take 80 mg by mouth daily at 6 PM., Disp: , Rfl:  .  carvedilol (COREG) 25 MG tablet, Take 1 tablet (25 mg total) by mouth 2 (two) times daily with a meal., Disp: 60 tablet, Rfl: 3 .  DULoxetine (CYMBALTA) 30 MG capsule, Take 1 capsule (30 mg total) by mouth daily., Disp: 30 capsule, Rfl: 3 .  ergocalciferol (VITAMIN D2) 1.25 MG (50000 UT) capsule, Take 1 capsule by mouth once a week., Disp: , Rfl:  .  furosemide (LASIX) 80 MG tablet, TAKE 1 TABLET (80 MG TOTAL) BY MOUTH 2 (TWO) TIMES DAILY., Disp: 60 tablet, Rfl: 0 .  gabapentin (NEURONTIN) 100 MG capsule, Take 1 capsule (100 mg total) by mouth at bedtime., Disp:  , Rfl:  .  insulin aspart (NOVOLOG) 100 UNIT/ML injection, 0 to 12 units subcutaneously 3 times daily before meals as per sliding scale, Disp: 30 mL, Rfl: 3 .  Insulin Glargine (BASAGLAR KWIKPEN) 100 UNIT/ML SOPN, Inject 0.15 mLs (15 Units total) into the skin at bedtime., Disp: 30 mL, Rfl: 3 .  lansoprazole (PREVACID) 15 MG capsule, TAKE 1 CAPSULE (15 MG TOTAL) BY MOUTH DAILY., Disp: 30 capsule, Rfl: 2 .  metoCLOPramide (REGLAN) 5 MG tablet, Take 1 tablet (5 mg total) by mouth 3 (three) times daily as needed  for nausea., Disp: 90 tablet, Rfl: 1 .  Misc. Devices MISC, Portable oxygen concentrator.  Diagnosis-chronic respiratory failure., Disp: 1 each, Rfl: 0 .  Misc. Devices MISC, Scale; Dx - CHF, Disp: 1 each, Rfl: 0 .  Misc. Devices [facility] bed.  Diagnosis CHF.  Lifetime use.  Weight 165 lbs., Disp: 1 each, Rfl: 0 .  Multiple Vitamins-Minerals (MULTIVITAMIN WITH MINERALS) tablet, Take 1 tablet by mouth daily., Disp: , Rfl:  .  Accu-Chek FastClix  Lancets MISC, USE AS DIRECTED TO TEST BLOOD SUGAR THREE TIMES DAILY (Patient not taking: Reported on 07/31/2019), Disp: 102 each, Rfl: 12 .  Blood Glucose Monitoring Suppl (ACCU-CHEK GUIDE) w/Device KIT, 1 each by Does not apply route 3 (three) times daily. (Patient not taking: Reported on 07/31/2019), Disp: 1 kit, Rfl: 0 .  EASY COMFORT PEN NEEDLES 31G X 5 MM MISC, USE FOUR TIMES PER DAY FOR INSULIN ADMINISTRATION (Patient not taking: Reported on 08/14/2019), Disp: 200 each, Rfl: 12 .  glucose blood (ACCU-CHEK GUIDE) test strip, USE AS DIRECTED TO TEST BLOOD SUGAR THREE TIMES DAILY (Patient not taking: Reported on 07/31/2019), Disp: 100 each, Rfl: 12 .  Lancet Device MISC, Use as instructed 3 times daily (Patient not taking: Reported on 08/07/2019), Disp: 1 each, Rfl: 0 .  Misc. Devices MISC, Rollaor with seat. Dx: Congestive Heart Failure (Patient not taking: Reported on 08/14/2019), Disp: 1 each, Rfl: 0 .  polyethylene glycol (MIRALAX / GLYCOLAX) 17 g packet, Take 17 g by mouth daily as needed for moderate constipation. (Patient not taking: Reported on 07/24/2019), Disp: , Rfl:  .  sodium chloride (OCEAN) 0.65 % SOLN nasal spray, Place 1 spray into both nostrils as needed for congestion., Disp: , Rfl:  .  SUPER B COMPLEX/C CAPS, Take 1 capsule by mouth daily., Disp: , Rfl:  Allergies  Allergen Reactions  . Garlic Shortness Of Breath, Itching and Swelling    Raw*  Hand itching and swelling  . Latex Itching  . Morphine And Related Itching and Other (See Comments)    Headache   . Other Itching    Reaction to newspaper ink -itching and headache     Social History   Socioeconomic History  . Marital status: Widowed    Spouse name: Not on file  . Number of children: Not on file  . Years of education: Not on file  . Highest education level: Not on file  Occupational History  . Not on file  Tobacco Use  . Smoking status: Never Smoker  . Smokeless tobacco: Never Used  Substance and Sexual  Activity  . Alcohol use: No  . Drug use: No  . Sexual activity: Not Currently    Birth control/protection: None  Other Topics Concern  . Not on file  Social History Narrative  . Not on file   Social Determinants of Health   Financial Resource Strain:   . Difficulty of Paying Living Expenses:   Food Insecurity:   . Worried About Charity fundraiser in the Last Year:   . Arboriculturist in the Last Year:   Transportation Needs:   . Film/video editor (Medical):   Marland Kitchen Lack of Transportation (Non-Medical):   Physical Activity:   . Days of Exercise per Week:   . Minutes of Exercise per Session:   Stress:   . Feeling of Stress :   Social Connections:   . Frequency of Communication with Friends and Family:   . Frequency of Social Gatherings  with Friends and Family:   . Attends Religious Services:   . Active Member of Clubs or Organizations:   . Attends Archivist Meetings:   Marland Kitchen Marital Status:   Intimate Partner Violence:   . Fear of Current or Ex-Partner:   . Emotionally Abused:   Marland Kitchen Physically Abused:   . Sexually Abused:     Physical Exam Vitals reviewed.  HENT:     Head: Normocephalic.     Nose: Nose normal.     Mouth/Throat:     Mouth: Mucous membranes are moist.  Eyes:     Pupils: Pupils are equal, round, and reactive to light.  Cardiovascular:     Rate and Rhythm: Normal rate and regular rhythm.  Pulmonary:     Effort: Respiratory distress present.     Breath sounds: Normal breath sounds.     Comments: Short of breath during exertion.  Abdominal:     General: There is distension.     Palpations: Abdomen is soft.  Musculoskeletal:        General: Normal range of motion.     Cervical back: Normal range of motion.     Right lower leg: No edema.     Left lower leg: No edema.  Skin:    General: Skin is warm and dry.     Capillary Refill: Capillary refill takes less than 2 seconds.  Neurological:     Mental Status: She is alert. Mental status is  at baseline.  Psychiatric:        Mood and Affect: Mood normal.     Arrived for home visit for Aleeha who was alert and oriented opening the door for me as I entered her home. Assunta was walking on her oxygen and appeared short of breath upon walking. Zea stated this just started today. Vika noted she had missed multiple doses of her medications in her pill box. Avionna reports she simply forgets to take them after multiple coaching sessions and alarms set on her phone for same. Jyll was educated on importance of taking her medicines and how she needs to ensure she gets her daily medications. Laria verbalizes understanding. Besse also missed her newly scheduled Gastro appointment with [facility] yesterday due to her using [facility] transportation and them requesting funds for transport and she not being able to pay them. Alicia and her daughter Phoebe Sharps were made aware of this prior to appointment and she stated she forgot. Phoebe Sharps and Kegan made aware if funds are an issue she can utilize Medicaid transportation. Both verbalized understanding. I will assist in rescheduling GI appointment. Leisa's vitals were obtained and were as noted. Lung sounds were clear, no JVD, no leg edema. Kaydance has not been checking her blood glucose levels due to constant tremors and being able to operate blood glucose equipment with tremors, I suggest a hands free option to look in to. Trevia agreed and I will plan to visit in one week. Home visit complete.   Refills: NONE     Future Appointments  Date Time Provider [place]  08/19/2019  2:30 PM Tat, Eustace Quail, DO [facility] None  10/22/2019  2:45 PM Burtis Junes, NP [facility]     ACTION: Home visit completed Next visit planned for one week

## 2019-08-15 NOTE — Progress Notes (Signed)
Assessment/Plan:   1.  Essential Tremor, moderately severe.  -This is evidenced by the symmetrical nature and longstanding hx of gradually getting worse.  We discussed nature and pathophysiology.  We discussed that this can continue to gradually get worse with time.  We discussed that some medications can worsen this, as can caffeine use.  We discussed medication therapy as well as surgical therapy.  Ultimately, the patient decided to start primidone and slowly work to 50 mg bid.  R/B/SE were discussed.  The opportunity to ask questions was given and they were answered to the best of my ability.  The patient expressed understanding and willingness to follow the outlined treatment protocols..    I talked to the patient about the logistics associated with DBS therapy.  I talked to the patient about risks/benefits/side effects of DBS therapy.  We talked about risks which included but were not limited to infection, paralysis, intraoperative seizure, death, stroke, bleeding around the electrode.   I talked to patient about fiducial placement 1 week prior to DBS therapy.  I talked to the patient about what to expect in the operating room, including the fact that this is an awake surgery.  We talked about battery placement as well as which is done under general anesthesia, generally approximately one week following the initial surgery.  We also talked about the fact that the patient will need to be off of medications for surgery.  The patient and family were given the opportunity to ask questions, which they did, and I answered them to the best of my ability today.  I do think that we would need to investigate the issue with her being on O2 and even if she would be a surgery candidate (been on O2 since had covid over a year ago).  She was shown HIPAA compliant videos of patients who have had DBS surgery.  -Discussed that metoclopramide can cause tremor, but generally this is a resting tremor with parkinsonism, and  did not see a lot of this in her.  2.  Ptosis, left  -Patient does not really notice it.  Seem to fluctuate somewhat during the visit.  Will check myasthenia profile, especially given the shortness of breath.  She was actually in the hospital for about 2 months with Covid and ended up in a rehab facility afterward.  Subjective:   Kara Hanson was seen today in the movement disorders clinic for neurologic consultation at the request of [name], MD.  The consultation is for the evaluation of tremor.  Medical records made available to me have been reviewed.  Patient has previously been referred to [facility], but was not actually seen there because of no-shows to the clinic.  Medical records from the patient's primary care appointment in November, 2020 indicate that patient was having gait trouble and resting and intention tremor.  Patient was on metoclopramide at the time.  Patient on this for diabetic gastroparesis.  Patient has a history of uncontrolled diabetes.  Tremor: Yes.     How long has it been going on? Since childhood  At rest or with activation?  activation  When is it noted the most?  Writing, holding cup  Fam hx of tremor?  Yes.  , mother with similar tremor  Located where?  Used to be just the R hand hand but now the L and feet and lips as well  Affected by caffeine:  No. (1 cup coffee/day)  Affected by alcohol:  Doesn't drink alcohol  Affected  by stress:  yes  Affected by fatigue:  Yes.    Spills soup if on spoon:  Yes.    Spills glass of liquid if full:  Yes.    Affects ADL's (tying shoes, brushing teeth, etc):  No.  Tremor inducing meds:  Yes.   reglan; epic records appear that patient has been on this at least since August, 2019.  Tremor improving meds: yes, coreg  Other Specific Symptoms:  Voice: noted some vocal tremor Sleep:   Vivid Dreams:  Yes.  , sometimes but othertimes not  Acting out dreams:  No. Postural symptoms:  Yes.  , sometimes will use walker    Falls?  Yes.   , slipped 3 weeks ago where carpet strip was and tripped Loss of smell:  Yes.  , "it never came back after covid" Loss of taste:  Yes.   Urinary Incontinence:  No. Difficulty Swallowing:  No. Handwriting, micrographia: No. Depression:  No. Memory changes:  No. Hallucinations:  No.  visual distortions: No. N/V:  No. Lightheaded:  No. (will have that if she is without O2)  Syncope: No.    CT brain was done on January 28, 2019.  There was mild white matter disease.I personally reviewed that.  ALLERGIES:   Allergies  Allergen Reactions  . Garlic Shortness Of Breath, Itching and Swelling    Raw*  Hand itching and swelling  . Latex Itching  . Morphine And Related Itching and Other (See Comments)    Headache   . Other Itching    Reaction to newspaper ink -itching and headache    CURRENT MEDICATIONS:  Current Outpatient Medications  Medication Instructions  . Accu-Chek FastClix Lancets MISC USE AS DIRECTED TO TEST BLOOD SUGAR THREE TIMES DAILY  . allopurinol (ZYLOPRIM) 300 mg, Oral, Daily  . aspirin EC 81 mg, Oral, Daily  . atorvastatin (LIPITOR) 80 mg, Oral, Daily-1800  . Basaglar KwikPen 15 Units, Subcutaneous, Daily at bedtime  . Blood Glucose Monitoring Suppl (ACCU-CHEK GUIDE) w/Device KIT 1 each, Does not apply, 3 times daily  . carvedilol (COREG) 25 mg, Oral, 2 times daily with meals  . DULoxetine (CYMBALTA) 30 mg, Oral, Daily  . EASY COMFORT PEN NEEDLES 31G X 5 MM MISC USE FOUR TIMES PER DAY FOR INSULIN ADMINISTRATION  . ergocalciferol (VITAMIN D2) 1.25 MG (50000 UT) capsule 1 capsule, Oral, Weekly  . furosemide (LASIX) 80 MG tablet TAKE 1 TABLET (80 MG TOTAL) BY MOUTH 2 (TWO) TIMES DAILY.  Marland Kitchen gabapentin (NEURONTIN) 100 mg, Oral, Daily at bedtime  . glucose blood (ACCU-CHEK GUIDE) test strip USE AS DIRECTED TO TEST BLOOD SUGAR THREE TIMES DAILY  . insulin aspart (NOVOLOG) 100 UNIT/ML injection 0 to 12 units subcutaneously 3 times daily before meals as per  sliding scale  . Lancet Device MISC Use as instructed 3 times daily  . lansoprazole (PREVACID) 15 MG capsule TAKE 1 CAPSULE (15 MG TOTAL) BY MOUTH DAILY.  Marland Kitchen metoCLOPramide (REGLAN) 5 mg, Oral, 3 times daily PRN  . Misc. Devices MISC Portable oxygen concentrator.  Diagnosis-chronic respiratory failure.  . Misc. Devices MISC Rollaor with seat. Dx: Congestive Heart Failure  . Misc. Devices MISC Scale; Dx - CHF  . Misc. [facility] bed.  Diagnosis CHF.  Lifetime use.  Weight 165 lbs.  . Multiple Vitamins-Minerals (MULTIVITAMIN WITH MINERALS) tablet 1 tablet, Oral, Daily  . polyethylene glycol (MIRALAX / GLYCOLAX) 17 g, Oral, Daily PRN  . sodium chloride (OCEAN) 0.65 % SOLN nasal spray 1 spray, Each  Nare, As needed  . SUPER B COMPLEX/C CAPS 1 capsule, Oral, Daily    Objective:   PHYSICAL EXAMINATION:    VITALS:   Vitals:   08/19/19 1439  BP: 133/72  Pulse: 79  SpO2: 95%  Weight: 141 lb (64 kg)  Height: _0  (1.499 m)    GEN:  The patient appears stated age and is in NAD. HEENT:  Normocephalic, atraumatic.  The mucous membranes are moist. The superficial temporal arteries are without ropiness or tenderness. CV:  RRR Lungs:  CTAB.  On O2 - states that her lungs got bad after she had Covid a year or so ago.   Neck/HEME:  There are no carotid bruits bilaterally.  Neurological examination:  Orientation: The patient is alert and oriented x3.  Cranial nerves: There is good facial symmetry except there is L ptosis vs pseudoptosis.  Extraocular muscles are intact. The visual fields are full to confrontational testing. The speech is fluent and clear. Soft palate rises symmetrically and there is no tongue deviation. Hearing is intact to conversational tone. Sensation: Sensation is intact to light touch throughout (facial, trunk, extremities). Vibration is intact at the bilateral big toe. There is no extinction with double simultaneous stimulation.  Motor: Strength is 5/5 in the  bilateral upper and lower extremities.   Shoulder shrug is equal and symmetric.  There is no pronator drift. Deep tendon reflexes: Deep tendon reflexes are 2/4 at the bilateral biceps, triceps, brachioradialis, patella and achilles. Plantar responses are downgoing bilaterally.  Movement examination: Tone: There is normal tone in the bilateral upper extremities.  The tone in the lower extremities is normal.  Abnormal movements: No rest tremor.  There is moderate postural tremor.  It is worse with intention.  The right hand seems better when given a weight, but the left is worse.  She has significant trouble with Archimedes spirals.  She has trouble with pouring water from 1 glass to another. Coordination:  There is no decremation with RAM's Gait and Station: The patient pushes off of the chair to arise.  She ambulates well.  The biggest issue with ambulation is clearly her lungs/lung capacity.  She has her portable oxygen tank with her.   Chemistry      Component Value Date/Time   NA 139 07/01/2019 1313   NA 143 01/09/2019 0959   K 3.8 07/01/2019 1313   CL 102 07/01/2019 1244   CO2 26 07/01/2019 1244   BUN 42 (H) 07/01/2019 1244   BUN 39 (H) 01/09/2019 0959   CREATININE 1.77 (H) 07/01/2019 1244      Component Value Date/Time   CALCIUM 9.2 07/01/2019 1244   ALKPHOS 52 02/10/2019 0226   AST 17 02/10/2019 0226   ALT 10 02/10/2019 0226   BILITOT 0.2 (L) 02/10/2019 0226   BILITOT <0.2 03/29/2018 1213      Lab Results  Component Value Date   TSH 3.480 02/27/2019   Lab Results  Component Value Date   WBC 5.7 07/01/2019   HGB 10.2 (L) 07/01/2019   HCT 30.0 (L) 07/01/2019   MCV 91.2 07/01/2019   PLT 224 07/01/2019   Lab Results  Component Value Date   TSH 3.480 02/27/2019   Lab Results  Component Value Date   HGBA1C 7.4 (H) 04/18/2019      Total time spent on today's visit was 70 minutes, including both face-to-face time and nonface-to-face time.  Time included that spent  on review of records (prior notes available to me/labs/imaging if  pertinent), discussing treatment and goals, answering patient's questions and coordinating care.  Cc:  [name], MD

## 2019-08-19 ENCOUNTER — Other Ambulatory Visit: Payer: Self-pay

## 2019-08-19 ENCOUNTER — Ambulatory Visit (INDEPENDENT_AMBULATORY_CARE_PROVIDER_SITE_OTHER): Payer: Medicare Other | Admitting: Neurology

## 2019-08-19 ENCOUNTER — Encounter: Payer: Self-pay | Admitting: Neurology

## 2019-08-19 ENCOUNTER — Other Ambulatory Visit (INDEPENDENT_AMBULATORY_CARE_PROVIDER_SITE_OTHER): Payer: Medicare Other

## 2019-08-19 VITALS — BP 133/72 | HR 79 | Ht 59.0 in | Wt 141.0 lb

## 2019-08-19 DIAGNOSIS — R0602 Shortness of breath: Secondary | ICD-10-CM

## 2019-08-19 DIAGNOSIS — H02402 Unspecified ptosis of left eyelid: Secondary | ICD-10-CM

## 2019-08-19 DIAGNOSIS — G250 Essential tremor: Secondary | ICD-10-CM

## 2019-08-19 MED ORDER — PRIMIDONE 50 MG PO TABS: 50.0000 mg | ORAL_TABLET | Freq: Two times a day (BID) | ORAL | 1 refills | Status: DC

## 2019-08-19 NOTE — Patient Instructions (Signed)
1.  Start primidone, 50 mg, 1/2 tablet at night for a week and then increase to 1 tablet at bedtime for a week and then as long as you tolerate it, you can increase it to 1 tablet twice per day  2.  Your provider has requested that you have labwork completed today. Please go to [facility] ([id number]) on the second floor of this building before leaving the office today. You do not need to check in. If you are not called within 15 minutes please check with the front desk.   The physicians and staff at [facility] are committed to providing excellent care. You may receive a survey requesting feedback about your experience at our office. We strive to receive "very good" responses to the survey questions. If you feel that your experience would prevent you from giving the office a "very good " response, please contact our office to try to remedy the situation. We may be reached at [phone]. Thank you for taking the time out of your busy day to complete the survey.

## 2019-08-20 ENCOUNTER — Telehealth (HOSPITAL_COMMUNITY): Payer: Self-pay

## 2019-08-20 NOTE — Telephone Encounter (Signed)
[facility] states they need records from her previous GI doctor before scheduling an initial visit. I will contact previous GI office to have them send over records and then schedule GI appointment.

## 2019-08-21 ENCOUNTER — Other Ambulatory Visit: Payer: Self-pay

## 2019-08-21 ENCOUNTER — Telehealth (HOSPITAL_COMMUNITY): Payer: Self-pay

## 2019-08-21 NOTE — Telephone Encounter (Signed)
[facility] appointment scheduled for 6/24 at 1500. I will make patient aware and set up transportation.

## 2019-08-21 NOTE — Progress Notes (Signed)
Paramedicine Encounter    Patient ID: Kara Hanson, female    DOB: 1956/03/28, 64 y.o.   MRN: [id number]   Patient Care Team: [name], MD as PCP - General (Family Medicine) Jerline Pain, MD as PCP - Cardiology (Cardiology)  Patient Active Problem List   Diagnosis Date Noted  . Acute systolic CHF (congestive heart failure) ([place]) 05/19/2019  . Acute exacerbation of CHF (congestive heart failure) ([place]) 04/18/2019  . Elevated troponin 04/18/2019  . Acute respiratory failure with hypoxia ([place]) 02/09/2019  . NSTEMI (non-ST elevated myocardial infarction) ([place]) 02/09/2019  . S/P thoracentesis   . Atelectasis   . Chronic respiratory failure ([place])   . Acute renal failure superimposed on stage 3 chronic kidney disease ([place])   . Epigastric pain   . CHF (congestive heart failure) ([place]) 01/23/2019  . CAP (community acquired pneumonia) 10/31/2018  . Acute CHF (congestive heart failure) ([place]) 09/24/2018  . SIRS (systemic inflammatory response syndrome) ([place]) 07/25/2018  . COVID-19 virus infection 07/20/2018  . Chronic diastolic CHF (congestive heart failure) ([place]) 07/20/2018  . Pneumonia due to COVID-19 virus 07/20/2018  . Vitamin D deficiency 04/02/2018  . Spinal stenosis 03/29/2018  . Acute on chronic respiratory failure with hypoxia ([place]) 03/20/2018  . Controlled type 2 diabetes with neuropathy ([place]) 03/20/2018  . CKD (chronic kidney disease), stage III 03/20/2018  . Acute on chronic combined systolic and diastolic CHF (congestive heart failure) ([place]) 11/27/2017  . AKI (acute kidney injury) ([place]) 11/27/2017  . Acute respiratory distress 11/27/2017  . Bulging lumbar disc 11/14/2017  . Gout 11/14/2017  . Hyperlipidemia LDL goal <70 09/30/2016  . Chest pain 06/17/2016  . Stress-induced cardiomyopathy 05/19/2016  . Type 2 diabetes mellitus with diabetic neuropathy, unspecified ([place]) 05/06/2016  . Vertigo 05/06/2016  . Controlled type 2 diabetes mellitus with hyperglycemia ([place])  04/23/2016  . Hypertension 04/23/2016  . Normochromic normocytic anemia 04/23/2016  . Syncope 04/23/2016    Current Outpatient Medications:  .  Accu-Chek FastClix Lancets MISC, USE AS DIRECTED TO TEST BLOOD SUGAR THREE TIMES DAILY, Disp: 102 each, Rfl: 12 .  allopurinol (ZYLOPRIM) 300 MG tablet, Take 300 mg by mouth daily., Disp: , Rfl:  .  aspirin EC 81 MG tablet, Take 81 mg by mouth daily., Disp: , Rfl:  .  atorvastatin (LIPITOR) 80 MG tablet, Take 80 mg by mouth daily at 6 PM., Disp: , Rfl:  .  Blood Glucose Monitoring Suppl (ACCU-CHEK GUIDE) w/Device KIT, 1 each by Does not apply route 3 (three) times daily., Disp: 1 kit, Rfl: 0 .  carvedilol (COREG) 25 MG tablet, Take 1 tablet (25 mg total) by mouth 2 (two) times daily with a meal., Disp: 60 tablet, Rfl: 3 .  DULoxetine (CYMBALTA) 30 MG capsule, Take 1 capsule (30 mg total) by mouth daily., Disp: 30 capsule, Rfl: 3 .  EASY COMFORT PEN NEEDLES 31G X 5 MM MISC, USE FOUR TIMES PER DAY FOR INSULIN ADMINISTRATION, Disp: 200 each, Rfl: 12 .  ergocalciferol (VITAMIN D2) 1.25 MG (50000 UT) capsule, Take 1 capsule by mouth once a week., Disp: , Rfl:  .  furosemide (LASIX) 80 MG tablet, TAKE 1 TABLET (80 MG TOTAL) BY MOUTH 2 (TWO) TIMES DAILY., Disp: 60 tablet, Rfl: 0 .  gabapentin (NEURONTIN) 100 MG capsule, Take 1 capsule (100 mg total) by mouth at bedtime. (Patient taking differently: Take 100 mg by mouth 2 (two) times daily. ), Disp:  , Rfl:  .  glucose blood (ACCU-CHEK GUIDE) test strip, USE AS  DIRECTED TO TEST BLOOD SUGAR THREE TIMES DAILY, Disp: 100 each, Rfl: 12 .  insulin aspart (NOVOLOG) 100 UNIT/ML injection, 0 to 12 units subcutaneously 3 times daily before meals as per sliding scale, Disp: 30 mL, Rfl: 3 .  Insulin Glargine (BASAGLAR KWIKPEN) 100 UNIT/ML SOPN, Inject 0.15 mLs (15 Units total) into the skin at bedtime., Disp: 30 mL, Rfl: 3 .  Lancet Device MISC, Use as instructed 3 times daily, Disp: 1 each, Rfl: 0 .  lansoprazole  (PREVACID) 15 MG capsule, TAKE 1 CAPSULE (15 MG TOTAL) BY MOUTH DAILY., Disp: 30 capsule, Rfl: 2 .  metoCLOPramide (REGLAN) 5 MG tablet, Take 1 tablet (5 mg total) by mouth 3 (three) times daily as needed for nausea., Disp: 90 tablet, Rfl: 1 .  Misc. Devices MISC, Portable oxygen concentrator.  Diagnosis-chronic respiratory failure., Disp: 1 each, Rfl: 0 .  Misc. Devices MISC, Scale; Dx - CHF, Disp: 1 each, Rfl: 0 .  Misc. Devices [facility] bed.  Diagnosis CHF.  Lifetime use.  Weight 165 lbs., Disp: 1 each, Rfl: 0 .  Multiple Vitamins-Minerals (MULTIVITAMIN WITH MINERALS) tablet, Take 1 tablet by mouth daily., Disp: , Rfl:  .  sodium chloride (OCEAN) 0.65 % SOLN nasal spray, Place 1 spray into both nostrils as needed for congestion., Disp: , Rfl:  .  Misc. Devices MISC, Rollaor with seat. Dx: Congestive Heart Failure (Patient not taking: Reported on 08/21/2019), Disp: 1 each, Rfl: 0 .  polyethylene glycol (MIRALAX / GLYCOLAX) 17 g packet, Take 17 g by mouth daily as needed for moderate constipation. (Patient not taking: Reported on 08/21/2019), Disp: , Rfl:  .  primidone (MYSOLINE) 50 MG tablet, Take 1 tablet (50 mg total) by mouth in the morning and at bedtime., Disp: 180 tablet, Rfl: 1 .  SUPER B COMPLEX/C CAPS, Take 1 capsule by mouth daily., Disp: , Rfl:  Allergies  Allergen Reactions  . Garlic Shortness Of Breath, Itching and Swelling    Raw*  Hand itching and swelling  . Latex Itching  . Morphine And Related Itching and Other (See Comments)    Headache   . Other Itching    Reaction to newspaper ink -itching and headache     Social History   Socioeconomic History  . Marital status: Widowed    Spouse name: Not on file  . Number of children: Not on file  . Years of education: Not on file  . Highest education level: Not on file  Occupational History  . Not on file  Tobacco Use  . Smoking status: Never Smoker  . Smokeless tobacco: Never Used  Substance and Sexual Activity  .  Alcohol use: No  . Drug use: No  . Sexual activity: Not Currently    Birth control/protection: None  Other Topics Concern  . Not on file  Social History Narrative  . Not on file   Social Determinants of Health   Financial Resource Strain:   . Difficulty of Paying Living Expenses:   Food Insecurity:   . Worried About Charity fundraiser in the Last Year:   . Arboriculturist in the Last Year:   Transportation Needs:   . Film/video editor (Medical):   Marland Kitchen Lack of Transportation (Non-Medical):   Physical Activity:   . Days of Exercise per Week:   . Minutes of Exercise per Session:   Stress:   . Feeling of Stress :   Social Connections:   . Frequency of Communication with Friends  and Family:   . Frequency of Social Gatherings with Friends and Family:   . Attends Religious Services:   . Active Member of Clubs or Organizations:   . Attends Archivist Meetings:   Marland Kitchen Marital Status:   Intimate Partner Violence:   . Fear of Current or Ex-Partner:   . Emotionally Abused:   Marland Kitchen Physically Abused:   . Sexually Abused:     Physical Exam Vitals reviewed.  Constitutional:      Appearance: She is normal weight.  HENT:     Head: Normocephalic.     Nose: Nose normal.     Mouth/Throat:     Mouth: Mucous membranes are moist.  Eyes:     Pupils: Pupils are equal, round, and reactive to light.  Cardiovascular:     Rate and Rhythm: Normal rate and regular rhythm.     Pulses: Normal pulses.     Heart sounds: Normal heart sounds.  Pulmonary:     Effort: Respiratory distress present.     Breath sounds: Normal breath sounds.     Comments: Short of breath upon walking. Abdominal:     General: There is distension.     Palpations: Abdomen is soft.  Musculoskeletal:        General: Normal range of motion.     Cervical back: Normal range of motion.     Right lower leg: No edema.     Left lower leg: No edema.  Skin:    General: Skin is warm and dry.     Capillary Refill:  Capillary refill takes less than 2 seconds.  Neurological:     Mental Status: She is alert. Mental status is at baseline.  Psychiatric:        Mood and Affect: Mood normal.     Arrived for home visit for [place] where she was walking around short of breath while on her oxygen. Kingsley reports she hate bad over the weekend and has been short of breath the last few days. No edema noted, no JVD, lung sounds clear on assessment. Abdomen appeared slightly distended. Vitals as noted. Patient has missed multiple doses of medications over the last week as well. Patient was educated on importance of taking her medications. Patient stated she gets sleepy easily after taking her meds. I moved some meds to night time spot in particular Duloxetine and Allupurinol. Patient stated she is more apt to take her meds at night then the evening doses. So patient only takes and AM and PM dose now. Patient agreed with same. Hopefully this will assist with missed meds. Appointments wrote down for [place]. Home visit complete. Will see in one week.   Refills: NONE    Future Appointments  Date Time Provider [place]  10/22/2019  2:45 PM Burtis Junes, NP [id number]  02/03/2020  8:15 AM Tat, Eustace Quail, DO [facility] None     ACTION: Home visit completed Next visit planned for one week

## 2019-08-24 LAB — MYASTHENIA GRAVIS PANEL 2
A CHR BINDING ABS: <0.3 nmol/L
ACHR Blocking Abs: <15 % Inhibition (ref <15)
Acetylchol Modul Ab: 17 % Inhibition

## 2019-08-28 ENCOUNTER — Other Ambulatory Visit: Payer: Self-pay

## 2019-08-28 ENCOUNTER — Telehealth: Payer: Self-pay

## 2019-08-28 NOTE — Telephone Encounter (Signed)
Call received from [name], EMT who was with the patient. The patient would like to know if her PCP would recommend that she receive the COVID vaccine

## 2019-08-28 NOTE — Progress Notes (Signed)
Paramedicine Encounter    Patient ID: Kara Hanson, female    DOB: 1956/03/28, 64 y.o.   MRN: [id number]   Patient Care Team: [name], MD as PCP - General (Family Medicine) Jerline Pain, MD as PCP - Cardiology (Cardiology)  Patient Active Problem List   Diagnosis Date Noted  . Acute systolic CHF (congestive heart failure) ([place]) 05/19/2019  . Acute exacerbation of CHF (congestive heart failure) ([place]) 04/18/2019  . Elevated troponin 04/18/2019  . Acute respiratory failure with hypoxia ([place]) 02/09/2019  . NSTEMI (non-ST elevated myocardial infarction) ([place]) 02/09/2019  . S/P thoracentesis   . Atelectasis   . Chronic respiratory failure ([place])   . Acute renal failure superimposed on stage 3 chronic kidney disease ([place])   . Epigastric pain   . CHF (congestive heart failure) ([place]) 01/23/2019  . CAP (community acquired pneumonia) 10/31/2018  . Acute CHF (congestive heart failure) ([place]) 09/24/2018  . SIRS (systemic inflammatory response syndrome) ([place]) 07/25/2018  . COVID-19 virus infection 07/20/2018  . Chronic diastolic CHF (congestive heart failure) ([place]) 07/20/2018  . Pneumonia due to COVID-19 virus 07/20/2018  . Vitamin D deficiency 04/02/2018  . Spinal stenosis 03/29/2018  . Acute on chronic respiratory failure with hypoxia ([place]) 03/20/2018  . Controlled type 2 diabetes with neuropathy ([place]) 03/20/2018  . CKD (chronic kidney disease), stage III 03/20/2018  . Acute on chronic combined systolic and diastolic CHF (congestive heart failure) ([place]) 11/27/2017  . AKI (acute kidney injury) ([place]) 11/27/2017  . Acute respiratory distress 11/27/2017  . Bulging lumbar disc 11/14/2017  . Gout 11/14/2017  . Hyperlipidemia LDL goal <70 09/30/2016  . Chest pain 06/17/2016  . Stress-induced cardiomyopathy 05/19/2016  . Type 2 diabetes mellitus with diabetic neuropathy, unspecified ([place]) 05/06/2016  . Vertigo 05/06/2016  . Controlled type 2 diabetes mellitus with hyperglycemia ([place])  04/23/2016  . Hypertension 04/23/2016  . Normochromic normocytic anemia 04/23/2016  . Syncope 04/23/2016    Current Outpatient Medications:  .  Accu-Chek FastClix Lancets MISC, USE AS DIRECTED TO TEST BLOOD SUGAR THREE TIMES DAILY, Disp: 102 each, Rfl: 12 .  allopurinol (ZYLOPRIM) 300 MG tablet, Take 300 mg by mouth daily., Disp: , Rfl:  .  aspirin EC 81 MG tablet, Take 81 mg by mouth daily., Disp: , Rfl:  .  atorvastatin (LIPITOR) 80 MG tablet, Take 80 mg by mouth daily at 6 PM., Disp: , Rfl:  .  Blood Glucose Monitoring Suppl (ACCU-CHEK GUIDE) w/Device KIT, 1 each by Does not apply route 3 (three) times daily., Disp: 1 kit, Rfl: 0 .  carvedilol (COREG) 25 MG tablet, Take 1 tablet (25 mg total) by mouth 2 (two) times daily with a meal., Disp: 60 tablet, Rfl: 3 .  DULoxetine (CYMBALTA) 30 MG capsule, Take 1 capsule (30 mg total) by mouth daily., Disp: 30 capsule, Rfl: 3 .  EASY COMFORT PEN NEEDLES 31G X 5 MM MISC, USE FOUR TIMES PER DAY FOR INSULIN ADMINISTRATION, Disp: 200 each, Rfl: 12 .  ergocalciferol (VITAMIN D2) 1.25 MG (50000 UT) capsule, Take 1 capsule by mouth once a week., Disp: , Rfl:  .  furosemide (LASIX) 80 MG tablet, TAKE 1 TABLET (80 MG TOTAL) BY MOUTH 2 (TWO) TIMES DAILY., Disp: 60 tablet, Rfl: 0 .  gabapentin (NEURONTIN) 100 MG capsule, Take 1 capsule (100 mg total) by mouth at bedtime. (Patient taking differently: Take 100 mg by mouth 2 (two) times daily. ), Disp:  , Rfl:  .  glucose blood (ACCU-CHEK GUIDE) test strip, USE AS  DIRECTED TO TEST BLOOD SUGAR THREE TIMES DAILY, Disp: 100 each, Rfl: 12 .  insulin aspart (NOVOLOG) 100 UNIT/ML injection, 0 to 12 units subcutaneously 3 times daily before meals as per sliding scale, Disp: 30 mL, Rfl: 3 .  Insulin Glargine (BASAGLAR KWIKPEN) 100 UNIT/ML SOPN, Inject 0.15 mLs (15 Units total) into the skin at bedtime., Disp: 30 mL, Rfl: 3 .  Lancet Device MISC, Use as instructed 3 times daily, Disp: 1 each, Rfl: 0 .  lansoprazole  (PREVACID) 15 MG capsule, TAKE 1 CAPSULE (15 MG TOTAL) BY MOUTH DAILY., Disp: 30 capsule, Rfl: 2 .  metoCLOPramide (REGLAN) 5 MG tablet, Take 1 tablet (5 mg total) by mouth 3 (three) times daily as needed for nausea., Disp: 90 tablet, Rfl: 1 .  Misc. Devices MISC, Portable oxygen concentrator.  Diagnosis-chronic respiratory failure., Disp: 1 each, Rfl: 0 .  Misc. Devices MISC, Rollaor with seat. Dx: Congestive Heart Failure, Disp: 1 each, Rfl: 0 .  Misc. Devices MISC, Scale; Dx - CHF, Disp: 1 each, Rfl: 0 .  Misc. Devices [facility] bed.  Diagnosis CHF.  Lifetime use.  Weight 165 lbs., Disp: 1 each, Rfl: 0 .  Multiple Vitamins-Minerals (MULTIVITAMIN WITH MINERALS) tablet, Take 1 tablet by mouth daily., Disp: , Rfl:  .  sodium chloride (OCEAN) 0.65 % SOLN nasal spray, Place 1 spray into both nostrils as needed for congestion., Disp: , Rfl:  .  polyethylene glycol (MIRALAX / GLYCOLAX) 17 g packet, Take 17 g by mouth daily as needed for moderate constipation. (Patient not taking: Reported on 08/21/2019), Disp: , Rfl:  .  primidone (MYSOLINE) 50 MG tablet, Take 1 tablet (50 mg total) by mouth in the morning and at bedtime. (Patient not taking: Reported on 08/28/2019), Disp: 180 tablet, Rfl: 1 .  SUPER B COMPLEX/C CAPS, Take 1 capsule by mouth daily., Disp: , Rfl:  Allergies  Allergen Reactions  . Garlic Shortness Of Breath, Itching and Swelling    Raw*  Hand itching and swelling  . Latex Itching  . Morphine And Related Itching and Other (See Comments)    Headache   . Other Itching    Reaction to newspaper ink -itching and headache     Social History   Socioeconomic History  . Marital status: Widowed    Spouse name: Not on file  . Number of children: Not on file  . Years of education: Not on file  . Highest education level: Not on file  Occupational History  . Not on file  Tobacco Use  . Smoking status: Never Smoker  . Smokeless tobacco: Never Used  Substance and Sexual Activity  .  Alcohol use: No  . Drug use: No  . Sexual activity: Not Currently    Birth control/protection: None  Other Topics Concern  . Not on file  Social History Narrative  . Not on file   Social Determinants of Health   Financial Resource Strain:   . Difficulty of Paying Living Expenses:   Food Insecurity:   . Worried About Charity fundraiser in the Last Year:   . Arboriculturist in the Last Year:   Transportation Needs:   . Film/video editor (Medical):   Marland Kitchen Lack of Transportation (Non-Medical):   Physical Activity:   . Days of Exercise per Week:   . Minutes of Exercise per Session:   Stress:   . Feeling of Stress :   Social Connections:   . Frequency of Communication with Friends  and Family:   . Frequency of Social Gatherings with Friends and Family:   . Attends Religious Services:   . Active Member of Clubs or Organizations:   . Attends Archivist Meetings:   Marland Kitchen Marital Status:   Intimate Partner Violence:   . Fear of Current or Ex-Partner:   . Emotionally Abused:   Marland Kitchen Physically Abused:   . Sexually Abused:     Physical Exam Vitals reviewed.  Constitutional:      Appearance: She is normal weight.  HENT:     Head: Normocephalic.     Nose: Nose normal.     Mouth/Throat:     Mouth: Mucous membranes are moist.  Eyes:     Pupils: Pupils are equal, round, and reactive to light.  Cardiovascular:     Rate and Rhythm: Normal rate and regular rhythm.     Pulses: Normal pulses.     Heart sounds: Normal heart sounds.  Pulmonary:     Effort: Pulmonary effort is normal.     Breath sounds: Normal breath sounds.  Abdominal:     General: Abdomen is flat.     Palpations: Abdomen is soft.  Musculoskeletal:        General: Normal range of motion.     Cervical back: Normal range of motion.     Right lower leg: No edema.     Left lower leg: No edema.  Skin:    General: Skin is warm and dry.     Capillary Refill: Capillary refill takes less than 2 seconds.   Neurological:     Mental Status: She is alert. Mental status is at baseline.  Psychiatric:        Mood and Affect: Mood normal.    Arrived for home visit for Conna who was alert and oriented ambulating around her home with her oxygen on with no increased shortness of breath while walking. Genola reports she feels good and has had some increased energy during the day since moving her medications around in her pill box. Jacyln was compliant with her whole pill box this week. Medications were reviewed and confirmed. Pill box filled. Vitals obtained and as noted. Home visit complete. I will see Marabeth in one week.      Future Appointments  Date Time Provider [place]  10/22/2019  2:45 PM Burtis Junes, NP [facility]  02/03/2020  8:15 AM Tat, Eustace Quail, DO [facility] None     ACTION: Home visit completed Next visit planned for one week

## 2019-08-30 NOTE — Telephone Encounter (Signed)
Patient contact information given to Lessie Dings to place her on the list for home COVID vaccine.

## 2019-08-31 ENCOUNTER — Telehealth: Payer: Self-pay | Admitting: Physician Assistant

## 2019-08-31 NOTE — Telephone Encounter (Signed)
Called to discuss the homebound Covid-19 vaccination initiative with the patient and/or caregiver.   Message left to call back.  Rifka Ramey PA-C  [facility]     

## 2019-09-01 ENCOUNTER — Encounter: Payer: Self-pay | Admitting: Adult Health

## 2019-09-01 ENCOUNTER — Telehealth: Payer: Self-pay | Admitting: Adult Health

## 2019-09-01 NOTE — Telephone Encounter (Signed)
Called and [facility] with patient about COVID 19 homebound vaccination.  Asked that she return our call.  Wilber Bihari, NP

## 2019-09-04 ENCOUNTER — Other Ambulatory Visit: Payer: Self-pay | Admitting: Family Medicine

## 2019-09-04 ENCOUNTER — Other Ambulatory Visit (HOSPITAL_COMMUNITY): Payer: Self-pay

## 2019-09-04 NOTE — Progress Notes (Signed)
Paramedicine Encounter    Patient ID: Kara Hanson, female    DOB: February 23, 1956, 64 y.o.   MRN: [id number]   Patient Care Team: [name], MD as PCP - General (Family Medicine) Jerline Pain, MD as PCP - Cardiology (Cardiology)  Patient Active Problem List   Diagnosis Date Noted  . Acute systolic CHF (congestive heart failure) ([place]) 05/19/2019  . Acute exacerbation of CHF (congestive heart failure) ([place]) 04/18/2019  . Elevated troponin 04/18/2019  . Acute respiratory failure with hypoxia ([place]) 02/09/2019  . NSTEMI (non-ST elevated myocardial infarction) ([place]) 02/09/2019  . S/P thoracentesis   . Atelectasis   . Chronic respiratory failure ([place])   . Acute renal failure superimposed on stage 3 chronic kidney disease ([place])   . Epigastric pain   . CHF (congestive heart failure) ([place]) 01/23/2019  . CAP (community acquired pneumonia) 10/31/2018  . Acute CHF (congestive heart failure) ([place]) 09/24/2018  . SIRS (systemic inflammatory response syndrome) ([place]) 07/25/2018  . COVID-19 virus infection 07/20/2018  . Chronic diastolic CHF (congestive heart failure) ([place]) 07/20/2018  . Pneumonia due to COVID-19 virus 07/20/2018  . Vitamin D deficiency 04/02/2018  . Spinal stenosis 03/29/2018  . Acute on chronic respiratory failure with hypoxia ([place]) 03/20/2018  . Controlled type 2 diabetes with neuropathy ([place]) 03/20/2018  . CKD (chronic kidney disease), stage III 03/20/2018  . Acute on chronic combined systolic and diastolic CHF (congestive heart failure) ([place]) 11/27/2017  . AKI (acute kidney injury) ([place]) 11/27/2017  . Acute respiratory distress 11/27/2017  . Bulging lumbar disc 11/14/2017  . Gout 11/14/2017  . Hyperlipidemia LDL goal <70 09/30/2016  . Chest pain 06/17/2016  . Stress-induced cardiomyopathy 05/19/2016  . Type 2 diabetes mellitus with diabetic neuropathy, unspecified ([place]) 05/06/2016  . Vertigo 05/06/2016  . Controlled type 2 diabetes mellitus with hyperglycemia ([place])  04/23/2016  . Hypertension 04/23/2016  . Normochromic normocytic anemia 04/23/2016  . Syncope 04/23/2016    Current Outpatient Medications:  .  Accu-Chek FastClix Lancets MISC, USE AS DIRECTED TO TEST BLOOD SUGAR THREE TIMES DAILY, Disp: 102 each, Rfl: 12 .  allopurinol (ZYLOPRIM) 300 MG tablet, Take 300 mg by mouth daily., Disp: , Rfl:  .  aspirin EC 81 MG tablet, Take 81 mg by mouth daily., Disp: , Rfl:  .  atorvastatin (LIPITOR) 80 MG tablet, Take 80 mg by mouth daily at 6 PM., Disp: , Rfl:  .  carvedilol (COREG) 25 MG tablet, Take 1 tablet (25 mg total) by mouth 2 (two) times daily with a meal., Disp: 60 tablet, Rfl: 3 .  DULoxetine (CYMBALTA) 30 MG capsule, Take 1 capsule (30 mg total) by mouth daily., Disp: 30 capsule, Rfl: 3 .  ergocalciferol (VITAMIN D2) 1.25 MG (50000 UT) capsule, Take 1 capsule by mouth once a week., Disp: , Rfl:  .  furosemide (LASIX) 80 MG tablet, TAKE 1 TABLET (80 MG TOTAL) BY MOUTH 2 (TWO) TIMES DAILY., Disp: 60 tablet, Rfl: 0 .  gabapentin (NEURONTIN) 100 MG capsule, Take 1 capsule (100 mg total) by mouth at bedtime. (Patient taking differently: Take 100 mg by mouth 2 (two) times daily. ), Disp:  , Rfl:  .  insulin aspart (NOVOLOG) 100 UNIT/ML injection, 0 to 12 units subcutaneously 3 times daily before meals as per sliding scale, Disp: 30 mL, Rfl: 3 .  Insulin Glargine (BASAGLAR KWIKPEN) 100 UNIT/ML SOPN, Inject 0.15 mLs (15 Units total) into the skin at bedtime., Disp: 30 mL, Rfl: 3 .  lansoprazole (PREVACID) 15 MG capsule, TAKE  1 CAPSULE (15 MG TOTAL) BY MOUTH DAILY., Disp: 30 capsule, Rfl: 2 .  metoCLOPramide (REGLAN) 5 MG tablet, Take 1 tablet (5 mg total) by mouth 3 (three) times daily as needed for nausea., Disp: 90 tablet, Rfl: 1 .  Misc. Devices MISC, Portable oxygen concentrator.  Diagnosis-chronic respiratory failure., Disp: 1 each, Rfl: 0 .  Misc. Devices MISC, Rollaor with seat. Dx: Congestive Heart Failure, Disp: 1 each, Rfl: 0 .  Misc. Devices  MISC, Scale; Dx - CHF, Disp: 1 each, Rfl: 0 .  Misc. Devices [facility] bed.  Diagnosis CHF.  Lifetime use.  Weight 165 lbs., Disp: 1 each, Rfl: 0 .  Multiple Vitamins-Minerals (MULTIVITAMIN WITH MINERALS) tablet, Take 1 tablet by mouth daily., Disp: , Rfl:  .  Blood Glucose Monitoring Suppl (ACCU-CHEK GUIDE) w/Device KIT, 1 each by Does not apply route 3 (three) times daily., Disp: 1 kit, Rfl: 0 .  EASY COMFORT PEN NEEDLES 31G X 5 MM MISC, USE FOUR TIMES PER DAY FOR INSULIN ADMINISTRATION, Disp: 200 each, Rfl: 12 .  glucose blood (ACCU-CHEK GUIDE) test strip, USE AS DIRECTED TO TEST BLOOD SUGAR THREE TIMES DAILY, Disp: 100 each, Rfl: 12 .  Lancet Device MISC, Use as instructed 3 times daily, Disp: 1 each, Rfl: 0 .  polyethylene glycol (MIRALAX / GLYCOLAX) 17 g packet, Take 17 g by mouth daily as needed for moderate constipation. (Patient not taking: Reported on 08/21/2019), Disp: , Rfl:  .  primidone (MYSOLINE) 50 MG tablet, Take 1 tablet (50 mg total) by mouth in the morning and at bedtime. (Patient not taking: Reported on 08/28/2019), Disp: 180 tablet, Rfl: 1 .  sodium chloride (OCEAN) 0.65 % SOLN nasal spray, Place 1 spray into both nostrils as needed for congestion., Disp: , Rfl:  .  SUPER B COMPLEX/C CAPS, Take 1 capsule by mouth daily., Disp: , Rfl:  Allergies  Allergen Reactions  . Garlic Shortness Of Breath, Itching and Swelling    Raw*  Hand itching and swelling  . Latex Itching  . Morphine And Related Itching and Other (See Comments)    Headache   . Other Itching    Reaction to newspaper ink -itching and headache     Social History   Socioeconomic History  . Marital status: Widowed    Spouse name: Not on file  . Number of children: Not on file  . Years of education: Not on file  . Highest education level: Not on file  Occupational History  . Not on file  Tobacco Use  . Smoking status: Never Smoker  . Smokeless tobacco: Never Used  Substance and Sexual Activity  .  Alcohol use: No  . Drug use: No  . Sexual activity: Not Currently    Birth control/protection: None  Other Topics Concern  . Not on file  Social History Narrative  . Not on file   Social Determinants of Health   Financial Resource Strain:   . Difficulty of Paying Living Expenses:   Food Insecurity:   . Worried About Charity fundraiser in the Last Year:   . Arboriculturist in the Last Year:   Transportation Needs:   . Film/video editor (Medical):   Marland Kitchen Lack of Transportation (Non-Medical):   Physical Activity:   . Days of Exercise per Week:   . Minutes of Exercise per Session:   Stress:   . Feeling of Stress :   Social Connections:   . Frequency of Communication with Friends  and Family:   . Frequency of Social Gatherings with Friends and Family:   . Attends Religious Services:   . Active Member of Clubs or Organizations:   . Attends Archivist Meetings:   Marland Kitchen Marital Status:   Intimate Partner Violence:   . Fear of Current or Ex-Partner:   . Emotionally Abused:   Marland Kitchen Physically Abused:   . Sexually Abused:     Physical Exam   Arrived for home visit for Kara Hanson who was alert and oriented ambulating around her home with her oxygen on. Kara Hanson did not appear short of breath while walking. Kara Hanson denied any chest pain, shortness of breath or increased dizziness. Kara Hanson did state her stomach has been bothering her. GI appointment coming up this month Kara Hanson was compliant with medications this week. Medications were reviewed and confirmed. Pill box filled accordingly. Kara Hanson agreed to home visit in one week.   Refills: -Lasix -Cymbalta  -Lansoprazole   _has not received primidone yet.        Future Appointments  Date Time Provider [place]  10/22/2019  2:45 PM Burtis Junes, NP [facility]  02/03/2020  8:15 AM Tat, Eustace Quail, DO [facility] None     ACTION: Home visit completed Next visit planned for one week

## 2019-09-11 ENCOUNTER — Other Ambulatory Visit (HOSPITAL_COMMUNITY): Payer: Self-pay

## 2019-09-11 NOTE — Progress Notes (Signed)
Medications verified and pill box filled. Kara Hanson reports her nurse Lattie Haw with [facility] came by this morning and obtained vitals and preformed assessment.   No edema noted to lower legs, abdomen distended but soft on palpation.   Medications reviewed and confirmed. Pill box filled accordingly.   CBG- 122

## 2019-09-19 ENCOUNTER — Other Ambulatory Visit: Payer: Self-pay

## 2019-09-19 ENCOUNTER — Telehealth: Payer: Self-pay | Admitting: *Deleted

## 2019-09-19 NOTE — Telephone Encounter (Signed)
Received phone call from [facility], EMT there with pt doing weekly check and medication fill.  Heather reports pt has edema bilaterally, a 4 - 5 lb wt gain since last week, trouble breathing when laying flat - having to use 2 pillows.  She is taking Furosemide 80 mg a day.  Nira Conn is asking if pt can increase this for a few days.  Advised I will forward to Dr Marlou Porch for review and c/b with any new orders.  Last BMP 07/01/19 with BUN 42 and Crea 1.77.

## 2019-09-19 NOTE — Progress Notes (Signed)
Paramedicine Encounter    Patient ID: Kara Hanson, female    DOB: 1955-11-11, 64 y.o.   MRN: [id number]   Patient Care Team: [name], MD as PCP - General (Family Medicine) Jerline Pain, MD as PCP - Cardiology (Cardiology)  Patient Active Problem List   Diagnosis Date Noted  . Acute systolic CHF (congestive heart failure) ([place]) 05/19/2019  . Acute exacerbation of CHF (congestive heart failure) ([place]) 04/18/2019  . Elevated troponin 04/18/2019  . Acute respiratory failure with hypoxia ([place]) 02/09/2019  . NSTEMI (non-ST elevated myocardial infarction) ([place]) 02/09/2019  . S/P thoracentesis   . Atelectasis   . Chronic respiratory failure ([place])   . Acute renal failure superimposed on stage 3 chronic kidney disease ([place])   . Epigastric pain   . CHF (congestive heart failure) ([place]) 01/23/2019  . CAP (community acquired pneumonia) 10/31/2018  . Acute CHF (congestive heart failure) ([place]) 09/24/2018  . SIRS (systemic inflammatory response syndrome) ([place]) 07/25/2018  . COVID-19 virus infection 07/20/2018  . Chronic diastolic CHF (congestive heart failure) ([place]) 07/20/2018  . Pneumonia due to COVID-19 virus 07/20/2018  . Vitamin D deficiency 04/02/2018  . Spinal stenosis 03/29/2018  . Acute on chronic respiratory failure with hypoxia ([place]) 03/20/2018  . Controlled type 2 diabetes with neuropathy ([place]) 03/20/2018  . CKD (chronic kidney disease), stage III 03/20/2018  . Acute on chronic combined systolic and diastolic CHF (congestive heart failure) ([place]) 11/27/2017  . AKI (acute kidney injury) ([place]) 11/27/2017  . Acute respiratory distress 11/27/2017  . Bulging lumbar disc 11/14/2017  . Gout 11/14/2017  . Hyperlipidemia LDL goal <70 09/30/2016  . Chest pain 06/17/2016  . Stress-induced cardiomyopathy 05/19/2016  . Type 2 diabetes mellitus with diabetic neuropathy, unspecified ([place]) 05/06/2016  . Vertigo 05/06/2016  . Controlled type 2 diabetes mellitus with hyperglycemia ([place])  04/23/2016  . Hypertension 04/23/2016  . Normochromic normocytic anemia 04/23/2016  . Syncope 04/23/2016    Current Outpatient Medications:  .  allopurinol (ZYLOPRIM) 300 MG tablet, Take 300 mg by mouth daily., Disp: , Rfl:  .  aspirin EC 81 MG tablet, Take 81 mg by mouth daily., Disp: , Rfl:  .  atorvastatin (LIPITOR) 80 MG tablet, Take 80 mg by mouth daily at 6 PM., Disp: , Rfl:  .  carvedilol (COREG) 25 MG tablet, Take 1 tablet (25 mg total) by mouth 2 (two) times daily with a meal., Disp: 60 tablet, Rfl: 3 .  DULoxetine (CYMBALTA) 30 MG capsule, Take 1 capsule (30 mg total) by mouth daily., Disp: 30 capsule, Rfl: 3 .  EASY COMFORT PEN NEEDLES 31G X 5 MM MISC, USE FOUR TIMES PER DAY FOR INSULIN ADMINISTRATION, Disp: 200 each, Rfl: 12 .  furosemide (LASIX) 80 MG tablet, TAKE 1 TABLET (80 MG TOTAL) BY MOUTH 2 (TWO) TIMES DAILY., Disp: 60 tablet, Rfl: 0 .  gabapentin (NEURONTIN) 100 MG capsule, Take 1 capsule (100 mg total) by mouth at bedtime. (Patient taking differently: Take 100 mg by mouth 2 (two) times daily. ), Disp:  , Rfl:  .  insulin aspart (NOVOLOG) 100 UNIT/ML injection, 0 to 12 units subcutaneously 3 times daily before meals as per sliding scale, Disp: 30 mL, Rfl: 3 .  Insulin Glargine (BASAGLAR KWIKPEN) 100 UNIT/ML SOPN, Inject 0.15 mLs (15 Units total) into the skin at bedtime., Disp: 30 mL, Rfl: 3 .  lansoprazole (PREVACID) 15 MG capsule, TAKE 1 CAPSULE (15 MG TOTAL) BY MOUTH DAILY., Disp: 30 capsule, Rfl: 2 .  metoCLOPramide (REGLAN) 5 MG  tablet, Take 1 tablet (5 mg total) by mouth 3 (three) times daily as needed for nausea., Disp: 90 tablet, Rfl: 1 .  Misc. Devices MISC, Portable oxygen concentrator.  Diagnosis-chronic respiratory failure., Disp: 1 each, Rfl: 0 .  Misc. Devices MISC, Scale; Dx - CHF, Disp: 1 each, Rfl: 0 .  Misc. Devices [facility] bed.  Diagnosis CHF.  Lifetime use.  Weight 165 lbs., Disp: 1 each, Rfl: 0 .  Multiple Vitamins-Minerals (MULTIVITAMIN WITH  MINERALS) tablet, Take 1 tablet by mouth daily., Disp: , Rfl:  .  primidone (MYSOLINE) 50 MG tablet, Take 1 tablet (50 mg total) by mouth in the morning and at bedtime., Disp: 180 tablet, Rfl: 1 .  sodium chloride (OCEAN) 0.65 % SOLN nasal spray, Place 1 spray into both nostrils as needed for congestion., Disp: , Rfl:  .  Accu-Chek FastClix Lancets MISC, USE AS DIRECTED TO TEST BLOOD SUGAR THREE TIMES DAILY, Disp: 102 each, Rfl: 12 .  Blood Glucose Monitoring Suppl (ACCU-CHEK GUIDE) w/Device KIT, 1 each by Does not apply route 3 (three) times daily., Disp: 1 kit, Rfl: 0 .  ergocalciferol (VITAMIN D2) 1.25 MG (50000 UT) capsule, Take 1 capsule by mouth once a week., Disp: , Rfl:  .  glucose blood (ACCU-CHEK GUIDE) test strip, USE AS DIRECTED TO TEST BLOOD SUGAR THREE TIMES DAILY, Disp: 100 each, Rfl: 12 .  Lancet Device MISC, Use as instructed 3 times daily, Disp: 1 each, Rfl: 0 .  Misc. Devices MISC, Rollaor with seat. Dx: Congestive Heart Failure (Patient not taking: Reported on 09/11/2019), Disp: 1 each, Rfl: 0 .  polyethylene glycol (MIRALAX / GLYCOLAX) 17 g packet, Take 17 g by mouth daily as needed for moderate constipation. (Patient not taking: Reported on 08/21/2019), Disp: , Rfl:  .  SUPER B COMPLEX/C CAPS, Take 1 capsule by mouth daily. (Patient not taking: Reported on 09/19/2019), Disp: , Rfl:  Allergies  Allergen Reactions  . Garlic Shortness Of Breath, Itching and Swelling    Raw*  Hand itching and swelling  . Latex Itching  . Morphine And Related Itching and Other (See Comments)    Headache   . Other Itching    Reaction to newspaper ink -itching and headache     Social History   Socioeconomic History  . Marital status: Widowed    Spouse name: Not on file  . Number of children: Not on file  . Years of education: Not on file  . Highest education level: Not on file  Occupational History  . Not on file  Tobacco Use  . Smoking status: Never Smoker  . Smokeless tobacco: Never  Used  Vaping Use  . Vaping Use: Never used  Substance and Sexual Activity  . Alcohol use: No  . Drug use: No  . Sexual activity: Not Currently    Birth control/protection: None  Other Topics Concern  . Not on file  Social History Narrative  . Not on file   Social Determinants of Health   Financial Resource Strain:   . Difficulty of Paying Living Expenses:   Food Insecurity:   . Worried About Charity fundraiser in the Last Year:   . Arboriculturist in the Last Year:   Transportation Needs:   . Film/video editor (Medical):   Marland Kitchen Lack of Transportation (Non-Medical):   Physical Activity:   . Days of Exercise per Week:   . Minutes of Exercise per Session:   Stress:   . Feeling  of Stress :   Social Connections:   . Frequency of Communication with Friends and Family:   . Frequency of Social Gatherings with Friends and Family:   . Attends Religious Services:   . Active Member of Clubs or Organizations:   . Attends Archivist Meetings:   Marland Kitchen Marital Status:   Intimate Partner Violence:   . Fear of Current or Ex-Partner:   . Emotionally Abused:   Marland Kitchen Physically Abused:   . Sexually Abused:     Physical Exam Vitals reviewed.  HENT:     Head: Normocephalic.     Nose: Nose normal.     Mouth/Throat:     Mouth: Mucous membranes are moist.  Eyes:     Pupils: Pupils are equal, round, and reactive to light.  Cardiovascular:     Rate and Rhythm: Normal rate and regular rhythm.     Pulses: Normal pulses.     Heart sounds: Normal heart sounds.  Pulmonary:     Effort: Pulmonary effort is normal.     Breath sounds: Normal breath sounds.  Abdominal:     General: There is distension.     Palpations: Abdomen is soft.  Musculoskeletal:        General: Normal range of motion.     Cervical back: Normal range of motion.     Right lower leg: Edema present.     Left lower leg: Edema present.  Skin:    Capillary Refill: Capillary refill takes less than 2 seconds.   Neurological:     Mental Status: She is alert. Mental status is at baseline.  Psychiatric:        Mood and Affect: Mood normal.    Arrived for home visit for Amahia who was laying down in her bed alert and oriented complaining of feeling short of breath with weight gain and leg swelling. Upon assessment legs were noted to be swollen, with some edema noted. Lung sounds clear, no active distress. Weight up by 6lbs plus sleeping with 2 pillows upright due to not being able to lay flat. Patient was compliant with medications this week, she reports her diet is "fine". Patient is no longer receiving Mom's Meals. Vitals were obtained and are as noted. I contacted patients cardiologist Dr. Marlou Porch office and spoke to his nurse who reports she will transfer information to Dr. Marlou Porch and call me back with advice. I will continue to follow and make co-worker Zack aware since he works on tomorrow and I am off. Medications reviewed and confirmed. Pill box filled accordingly. Yunuen was positioned in bed comfortably. Taylia took morning meds prior to my arrival. I encouraged low sodium foods and minimal liquids. She stated she understood. Renesme reports her urine output is minimal as well. We will await returned call from Dr. Marlou Porch. I advised patient if symptoms were unbearable to call EMS. Patient and daughter agreed. Home visit complete.   Refills: NONE   CBG- 199     Future Appointments  Date Time Provider [place]  10/10/2019  9:30 AM [name], MD [facility] None  10/22/2019  2:45 PM Burtis Junes, NP [facility]  02/03/2020  8:15 AM Tat, Eustace Quail, DO [facility] None     ACTION: Home visit completed Next visit planned for one week

## 2019-09-20 NOTE — Telephone Encounter (Signed)
OK to increase lasix to 80 BID for 3 days Candee Furbish, MD

## 2019-09-25 ENCOUNTER — Other Ambulatory Visit: Payer: Self-pay | Admitting: Family Medicine

## 2019-09-25 ENCOUNTER — Telehealth: Payer: Self-pay | Admitting: *Deleted

## 2019-09-25 ENCOUNTER — Other Ambulatory Visit: Payer: Self-pay

## 2019-09-25 DIAGNOSIS — I10 Essential (primary) hypertension: Secondary | ICD-10-CM

## 2019-09-25 NOTE — Telephone Encounter (Signed)
Spoke with Kara Hanson and pt still has extra fluid pitting pedal edema bilaterally Pt also has abdominal pain due to fluid retention and also is on 02 at 5 L Pt has been taking Furosemide 80 mg bid Previous note instructed pt to increase Furosemide to 80 mg bid for 3 days Pt has already been taking 80 mg bid Will forward to Dr Marlou Porch for review and recommendations ./cy

## 2019-09-25 NOTE — Progress Notes (Signed)
Paramedicine Encounter    Patient ID: Kara Hanson, female    DOB: Aug 04, 1955, 64 y.o.   MRN: [id number]   Patient Care Team: [name], MD as PCP - General (Family Medicine) Jerline Pain, MD as PCP - Cardiology (Cardiology)  Patient Active Problem List   Diagnosis Date Noted  . Acute systolic CHF (congestive heart failure) ([place]) 05/19/2019  . Acute exacerbation of CHF (congestive heart failure) ([place]) 04/18/2019  . Elevated troponin 04/18/2019  . Acute respiratory failure with hypoxia ([place]) 02/09/2019  . NSTEMI (non-ST elevated myocardial infarction) ([place]) 02/09/2019  . S/P thoracentesis   . Atelectasis   . Chronic respiratory failure ([place])   . Acute renal failure superimposed on stage 3 chronic kidney disease ([place])   . Epigastric pain   . CHF (congestive heart failure) ([place]) 01/23/2019  . CAP (community acquired pneumonia) 10/31/2018  . Acute CHF (congestive heart failure) ([place]) 09/24/2018  . SIRS (systemic inflammatory response syndrome) ([place]) 07/25/2018  . COVID-19 virus infection 07/20/2018  . Chronic diastolic CHF (congestive heart failure) ([place]) 07/20/2018  . Pneumonia due to COVID-19 virus 07/20/2018  . Vitamin D deficiency 04/02/2018  . Spinal stenosis 03/29/2018  . Acute on chronic respiratory failure with hypoxia ([place]) 03/20/2018  . Controlled type 2 diabetes with neuropathy ([place]) 03/20/2018  . CKD (chronic kidney disease), stage III 03/20/2018  . Acute on chronic combined systolic and diastolic CHF (congestive heart failure) ([place]) 11/27/2017  . AKI (acute kidney injury) ([place]) 11/27/2017  . Acute respiratory distress 11/27/2017  . Bulging lumbar disc 11/14/2017  . Gout 11/14/2017  . Hyperlipidemia LDL goal <70 09/30/2016  . Chest pain 06/17/2016  . Stress-induced cardiomyopathy 05/19/2016  . Type 2 diabetes mellitus with diabetic neuropathy, unspecified ([place]) 05/06/2016  . Vertigo 05/06/2016  . Controlled type 2 diabetes mellitus with hyperglycemia ([place])  04/23/2016  . Hypertension 04/23/2016  . Normochromic normocytic anemia 04/23/2016  . Syncope 04/23/2016    Current Outpatient Medications:  .  allopurinol (ZYLOPRIM) 300 MG tablet, Take 300 mg by mouth daily., Disp: , Rfl:  .  aspirin EC 81 MG tablet, Take 81 mg by mouth daily., Disp: , Rfl:  .  atorvastatin (LIPITOR) 80 MG tablet, Take 80 mg by mouth daily at 6 PM., Disp: , Rfl:  .  carvedilol (COREG) 25 MG tablet, Take 1 tablet (25 mg total) by mouth 2 (two) times daily with a meal., Disp: 60 tablet, Rfl: 3 .  DULoxetine (CYMBALTA) 30 MG capsule, Take 1 capsule (30 mg total) by mouth daily., Disp: 30 capsule, Rfl: 3 .  furosemide (LASIX) 80 MG tablet, TAKE 1 TABLET (80 MG TOTAL) BY MOUTH 2 (TWO) TIMES DAILY., Disp: 60 tablet, Rfl: 0 .  gabapentin (NEURONTIN) 100 MG capsule, Take 1 capsule (100 mg total) by mouth at bedtime. (Patient taking differently: Take 100 mg by mouth 2 (two) times daily. ), Disp:  , Rfl:  .  insulin aspart (NOVOLOG) 100 UNIT/ML injection, 0 to 12 units subcutaneously 3 times daily before meals as per sliding scale, Disp: 30 mL, Rfl: 3 .  Insulin Glargine (BASAGLAR KWIKPEN) 100 UNIT/ML SOPN, Inject 0.15 mLs (15 Units total) into the skin at bedtime., Disp: 30 mL, Rfl: 3 .  lansoprazole (PREVACID) 15 MG capsule, TAKE 1 CAPSULE (15 MG TOTAL) BY MOUTH DAILY., Disp: 30 capsule, Rfl: 2 .  metoCLOPramide (REGLAN) 5 MG tablet, Take 1 tablet (5 mg total) by mouth 3 (three) times daily as needed for nausea., Disp: 90 tablet, Rfl: 1 .  Misc. Devices MISC, Portable oxygen concentrator.  Diagnosis-chronic respiratory failure., Disp: 1 each, Rfl: 0 .  Misc. Devices MISC, Scale; Dx - CHF, Disp: 1 each, Rfl: 0 .  Misc. Devices [facility] bed.  Diagnosis CHF.  Lifetime use.  Weight 165 lbs., Disp: 1 each, Rfl: 0 .  Multiple Vitamins-Minerals (MULTIVITAMIN WITH MINERALS) tablet, Take 1 tablet by mouth daily., Disp: , Rfl:  .  primidone (MYSOLINE) 50 MG tablet, Take 1 tablet (50 mg  total) by mouth in the morning and at bedtime., Disp: 180 tablet, Rfl: 1 .  Accu-Chek FastClix Lancets MISC, USE AS DIRECTED TO TEST BLOOD SUGAR THREE TIMES DAILY, Disp: 102 each, Rfl: 12 .  Blood Glucose Monitoring Suppl (ACCU-CHEK GUIDE) w/Device KIT, 1 each by Does not apply route 3 (three) times daily., Disp: 1 kit, Rfl: 0 .  EASY COMFORT PEN NEEDLES 31G X 5 MM MISC, USE FOUR TIMES PER DAY FOR INSULIN ADMINISTRATION, Disp: 200 each, Rfl: 12 .  ergocalciferol (VITAMIN D2) 1.25 MG (50000 UT) capsule, Take 1 capsule by mouth once a week. (Patient not taking: Reported on 09/25/2019), Disp: , Rfl:  .  glucose blood (ACCU-CHEK GUIDE) test strip, USE AS DIRECTED TO TEST BLOOD SUGAR THREE TIMES DAILY (Patient not taking: Reported on 09/25/2019), Disp: 100 each, Rfl: 12 .  Lancet Device MISC, Use as instructed 3 times daily (Patient not taking: Reported on 09/25/2019), Disp: 1 each, Rfl: 0 .  Misc. Devices MISC, Rollaor with seat. Dx: Congestive Heart Failure (Patient not taking: Reported on 09/11/2019), Disp: 1 each, Rfl: 0 .  polyethylene glycol (MIRALAX / GLYCOLAX) 17 g packet, Take 17 g by mouth daily as needed for moderate constipation. (Patient not taking: Reported on 08/21/2019), Disp: , Rfl:  .  sodium chloride (OCEAN) 0.65 % SOLN nasal spray, Place 1 spray into both nostrils as needed for congestion. (Patient not taking: Reported on 09/25/2019), Disp: , Rfl:  .  SUPER B COMPLEX/C CAPS, Take 1 capsule by mouth daily. (Patient not taking: Reported on 09/19/2019), Disp: , Rfl:  Allergies  Allergen Reactions  . Garlic Shortness Of Breath, Itching and Swelling    Raw*  Hand itching and swelling  . Latex Itching  . Morphine And Related Itching and Other (See Comments)    Headache   . Other Itching    Reaction to newspaper ink -itching and headache     Social History   Socioeconomic History  . Marital status: Widowed    Spouse name: Not on file  . Number of children: Not on file  . Years of  education: Not on file  . Highest education level: Not on file  Occupational History  . Not on file  Tobacco Use  . Smoking status: Never Smoker  . Smokeless tobacco: Never Used  Vaping Use  . Vaping Use: Never used  Substance and Sexual Activity  . Alcohol use: No  . Drug use: No  . Sexual activity: Not Currently    Birth control/protection: None  Other Topics Concern  . Not on file  Social History Narrative  . Not on file   Social Determinants of Health   Financial Resource Strain:   . Difficulty of Paying Living Expenses:   Food Insecurity:   . Worried About Charity fundraiser in the Last Year:   . Arboriculturist in the Last Year:   Transportation Needs:   . Film/video editor (Medical):   Marland Kitchen Lack of Transportation (Non-Medical):   Physical  Activity:   . Days of Exercise per Week:   . Minutes of Exercise per Session:   Stress:   . Feeling of Stress :   Social Connections:   . Frequency of Communication with Friends and Family:   . Frequency of Social Gatherings with Friends and Family:   . Attends Religious Services:   . Active Member of Clubs or Organizations:   . Attends Archivist Meetings:   Marland Kitchen Marital Status:   Intimate Partner Violence:   . Fear of Current or Ex-Partner:   . Emotionally Abused:   Marland Kitchen Physically Abused:   . Sexually Abused:     Physical Exam Vitals reviewed.  Constitutional:      Appearance: She is ill-appearing.  HENT:     Head: Normocephalic.     Nose: Nose normal.     Mouth/Throat:     Mouth: Mucous membranes are moist.  Eyes:     Pupils: Pupils are equal, round, and reactive to light.  Cardiovascular:     Rate and Rhythm: Normal rate and regular rhythm.     Pulses: Normal pulses.     Heart sounds: Normal heart sounds.  Pulmonary:     Effort: Pulmonary effort is normal.     Breath sounds: Normal breath sounds.     Comments: Complains of feeling short of breath upon movement.  Abdominal:     General: There is  distension.  Musculoskeletal:        General: Swelling present. Normal range of motion.     Cervical back: Normal range of motion.     Right lower leg: Edema present.  Skin:    Capillary Refill: Capillary refill takes less than 2 seconds.  Neurological:     Mental Status: Mental status is at baseline.  Psychiatric:        Mood and Affect: Mood normal.     Arrived for home visit for Billi who was greeted me at the door where she appeared short of breath while ambulating. Daren was experiencing shortness of breath last week with edema in her legs and shortness of breath upon movement, this week she complains of same. Last week I had reached out to Dr. Marlou Porch office to seek guidance on assisting patient with changes at home before going to ER. Dr. Marlou Porch ordered Lasix 2m BID for 3 days, however patient is already taking Lasix 825mBID daily. Patient had only taken a few doses of her daily medications in the last week including her Lasix. Patient weight was the same as last week +1lbs. Patient's vitals obtained and are as noted. Patient reports she had not taken her medications due to nausea and diarrhea. I encouraged patient to take prescribed Zofran for nausea and OTC medication for diarrhea but it was very important to take daily medications to assist with getting the fluid off and to assist her in feeling better. She verbalized understanding and agreed. CBG was taken and was 261, I assisted patient in taking 6 units of insulin. Patient noted to not have taken her insulin in several days, and patient admits this and reports she doesn't check her sugar due to the tremors she cannot hold the needles steady. I will report same to patient's PCP.  I educated patient on compliance and the importance of this. Patient verbalized her understanding.   [facility] Patient aware and plans to attend.   Home visit complete. Patient positioned in bed with two pillows  sitting upright with feet elevated. Patient knows to call 911 if symptoms persist and or worsen.  I will see patient in one week or sooner if needed.     Future Appointments  Date Time Provider [place]  10/10/2019  9:30 AM [name], MD [facility] None  10/22/2019  2:45 PM Burtis Junes, NP [facility]  02/03/2020  8:15 AM Tat, Eustace Quail, DO [facility] None     ACTION: Home visit completed Next visit planned for one week

## 2019-09-26 ENCOUNTER — Emergency Department (HOSPITAL_COMMUNITY): Payer: Medicare Other

## 2019-09-26 ENCOUNTER — Inpatient Hospital Stay (HOSPITAL_COMMUNITY): Payer: Medicare Other

## 2019-09-26 ENCOUNTER — Telehealth (HOSPITAL_COMMUNITY): Payer: Self-pay

## 2019-09-26 ENCOUNTER — Other Ambulatory Visit (HOSPITAL_COMMUNITY): Payer: Medicare Other

## 2019-09-26 ENCOUNTER — Inpatient Hospital Stay (HOSPITAL_COMMUNITY)
Admission: EM | Admit: 2019-09-26 | Discharge: 2019-10-07 | DRG: 286 | Disposition: A | Discharge status: Home Health | Payer: Medicare Other | Attending: Internal Medicine | Admitting: Internal Medicine

## 2019-09-26 ENCOUNTER — Other Ambulatory Visit: Payer: Self-pay

## 2019-09-26 ENCOUNTER — Encounter (HOSPITAL_COMMUNITY): Payer: Self-pay | Admitting: Emergency Medicine

## 2019-09-26 DIAGNOSIS — N1832 Chronic kidney disease, stage 3b: Secondary | ICD-10-CM | POA: Diagnosis present

## 2019-09-26 DIAGNOSIS — Z515 Encounter for palliative care: Secondary | ICD-10-CM

## 2019-09-26 DIAGNOSIS — I130 Hypertensive heart and chronic kidney disease with heart failure and stage 1 through stage 4 chronic kidney disease, or unspecified chronic kidney disease: Principal | ICD-10-CM | POA: Diagnosis present

## 2019-09-26 DIAGNOSIS — K219 Gastro-esophageal reflux disease without esophagitis: Secondary | ICD-10-CM | POA: Diagnosis present

## 2019-09-26 DIAGNOSIS — I10 Essential (primary) hypertension: Secondary | ICD-10-CM | POA: Diagnosis present

## 2019-09-26 DIAGNOSIS — I959 Hypotension, unspecified: Secondary | ICD-10-CM | POA: Diagnosis present

## 2019-09-26 DIAGNOSIS — I248 Other forms of acute ischemic heart disease: Secondary | ICD-10-CM | POA: Diagnosis present

## 2019-09-26 DIAGNOSIS — E871 Hypo-osmolality and hyponatremia: Secondary | ICD-10-CM | POA: Diagnosis present

## 2019-09-26 DIAGNOSIS — E785 Hyperlipidemia, unspecified: Secondary | ICD-10-CM | POA: Diagnosis present

## 2019-09-26 DIAGNOSIS — Z9119 Patient's noncompliance with other medical treatment and regimen: Secondary | ICD-10-CM

## 2019-09-26 DIAGNOSIS — J9621 Acute and chronic respiratory failure with hypoxia: Secondary | ICD-10-CM | POA: Diagnosis present

## 2019-09-26 DIAGNOSIS — Z7189 Other specified counseling: Secondary | ICD-10-CM | POA: Diagnosis not present

## 2019-09-26 DIAGNOSIS — N179 Acute kidney failure, unspecified: Secondary | ICD-10-CM | POA: Diagnosis present

## 2019-09-26 DIAGNOSIS — I5043 Acute on chronic combined systolic (congestive) and diastolic (congestive) heart failure: Secondary | ICD-10-CM | POA: Diagnosis not present

## 2019-09-26 DIAGNOSIS — Z794 Long term (current) use of insulin: Secondary | ICD-10-CM

## 2019-09-26 DIAGNOSIS — Z79899 Other long term (current) drug therapy: Secondary | ICD-10-CM

## 2019-09-26 DIAGNOSIS — I5082 Biventricular heart failure: Secondary | ICD-10-CM | POA: Diagnosis present

## 2019-09-26 DIAGNOSIS — E559 Vitamin D deficiency, unspecified: Secondary | ICD-10-CM | POA: Diagnosis present

## 2019-09-26 DIAGNOSIS — R7989 Other specified abnormal findings of blood chemistry: Secondary | ICD-10-CM | POA: Diagnosis present

## 2019-09-26 DIAGNOSIS — E878 Other disorders of electrolyte and fluid balance, not elsewhere classified: Secondary | ICD-10-CM | POA: Diagnosis present

## 2019-09-26 DIAGNOSIS — E1122 Type 2 diabetes mellitus with diabetic chronic kidney disease: Secondary | ICD-10-CM | POA: Diagnosis present

## 2019-09-26 DIAGNOSIS — D638 Anemia in other chronic diseases classified elsewhere: Secondary | ICD-10-CM | POA: Diagnosis present

## 2019-09-26 DIAGNOSIS — I2510 Atherosclerotic heart disease of native coronary artery without angina pectoris: Secondary | ICD-10-CM | POA: Diagnosis present

## 2019-09-26 DIAGNOSIS — E876 Hypokalemia: Secondary | ICD-10-CM | POA: Diagnosis present

## 2019-09-26 DIAGNOSIS — E860 Dehydration: Secondary | ICD-10-CM | POA: Diagnosis present

## 2019-09-26 DIAGNOSIS — D649 Anemia, unspecified: Secondary | ICD-10-CM | POA: Diagnosis present

## 2019-09-26 DIAGNOSIS — I252 Old myocardial infarction: Secondary | ICD-10-CM

## 2019-09-26 DIAGNOSIS — M109 Gout, unspecified: Secondary | ICD-10-CM | POA: Diagnosis present

## 2019-09-26 DIAGNOSIS — Z8701 Personal history of pneumonia (recurrent): Secondary | ICD-10-CM

## 2019-09-26 DIAGNOSIS — Z833 Family history of diabetes mellitus: Secondary | ICD-10-CM

## 2019-09-26 DIAGNOSIS — Z8616 Personal history of COVID-19: Secondary | ICD-10-CM

## 2019-09-26 DIAGNOSIS — E611 Iron deficiency: Secondary | ICD-10-CM | POA: Diagnosis present

## 2019-09-26 DIAGNOSIS — I1310 Hypertensive heart and chronic kidney disease without heart failure, with stage 1 through stage 4 chronic kidney disease, or unspecified chronic kidney disease: Secondary | ICD-10-CM

## 2019-09-26 DIAGNOSIS — I428 Other cardiomyopathies: Secondary | ICD-10-CM | POA: Diagnosis present

## 2019-09-26 DIAGNOSIS — I5023 Acute on chronic systolic (congestive) heart failure: Secondary | ICD-10-CM | POA: Diagnosis not present

## 2019-09-26 DIAGNOSIS — R778 Other specified abnormalities of plasma proteins: Secondary | ICD-10-CM | POA: Diagnosis not present

## 2019-09-26 DIAGNOSIS — T501X5A Adverse effect of loop [high-ceiling] diuretics, initial encounter: Secondary | ICD-10-CM | POA: Diagnosis present

## 2019-09-26 DIAGNOSIS — Z8673 Personal history of transient ischemic attack (TIA), and cerebral infarction without residual deficits: Secondary | ICD-10-CM

## 2019-09-26 DIAGNOSIS — R627 Adult failure to thrive: Secondary | ICD-10-CM | POA: Diagnosis present

## 2019-09-26 DIAGNOSIS — E1140 Type 2 diabetes mellitus with diabetic neuropathy, unspecified: Secondary | ICD-10-CM | POA: Diagnosis present

## 2019-09-26 DIAGNOSIS — Z9981 Dependence on supplemental oxygen: Secondary | ICD-10-CM

## 2019-09-26 DIAGNOSIS — I509 Heart failure, unspecified: Secondary | ICD-10-CM | POA: Diagnosis present

## 2019-09-26 DIAGNOSIS — Z5320 Procedure and treatment not carried out because of patient's decision for unspecified reasons: Secondary | ICD-10-CM | POA: Diagnosis not present

## 2019-09-26 DIAGNOSIS — J9611 Chronic respiratory failure with hypoxia: Secondary | ICD-10-CM | POA: Diagnosis present

## 2019-09-26 DIAGNOSIS — N183 Chronic kidney disease, stage 3 unspecified: Secondary | ICD-10-CM | POA: Diagnosis not present

## 2019-09-26 DIAGNOSIS — R197 Diarrhea, unspecified: Secondary | ICD-10-CM | POA: Diagnosis present

## 2019-09-26 DIAGNOSIS — Z7982 Long term (current) use of aspirin: Secondary | ICD-10-CM

## 2019-09-26 DIAGNOSIS — N289 Disorder of kidney and ureter, unspecified: Secondary | ICD-10-CM

## 2019-09-26 DIAGNOSIS — T501X6A Underdosing of loop [high-ceiling] diuretics, initial encounter: Secondary | ICD-10-CM | POA: Diagnosis present

## 2019-09-26 DIAGNOSIS — Z9114 Patient's other noncompliance with medication regimen: Secondary | ICD-10-CM

## 2019-09-26 LAB — CREATININE, SERUM
Creatinine, Ser: 2.47 mg/dL — ABNORMAL HIGH (ref 0.44–1.00)
GFR calc Af Amer: 23 mL/min — ABNORMAL LOW (ref >60)
GFR calc non Af Amer: 20 mL/min — ABNORMAL LOW (ref >60)

## 2019-09-26 LAB — COMPREHENSIVE METABOLIC PANEL
ALT: 14 U/L (ref 0–44)
AST: 20 U/L (ref 15–41)
Albumin: 2.8 g/dL — ABNORMAL LOW (ref 3.5–5.0)
Alkaline Phosphatase: 93 U/L (ref 38–126)
Anion gap: 10 (ref 5–15)
BUN: 58 mg/dL — ABNORMAL HIGH (ref 8–23)
CO2: 24 mmol/L (ref 22–32)
Calcium: 8.6 mg/dL — ABNORMAL LOW (ref 8.9–10.3)
Chloride: 97 mmol/L — ABNORMAL LOW (ref 98–111)
Creatinine, Ser: 2.56 mg/dL — ABNORMAL HIGH (ref 0.44–1.00)
GFR calc Af Amer: 22 mL/min — ABNORMAL LOW (ref >60)
GFR calc non Af Amer: 19 mL/min — ABNORMAL LOW (ref >60)
Glucose, Bld: 219 mg/dL — ABNORMAL HIGH (ref 70–99)
Potassium: 3.8 mmol/L (ref 3.5–5.1)
Sodium: 131 mmol/L — ABNORMAL LOW (ref 135–145)
Total Bilirubin: 0.5 mg/dL (ref 0.3–1.2)
Total Protein: 5.6 g/dL — ABNORMAL LOW (ref 6.5–8.1)

## 2019-09-26 LAB — URINALYSIS, ROUTINE W REFLEX MICROSCOPIC
Bilirubin Urine: NEGATIVE
Glucose, UA: 50 mg/dL — AB
Ketones, ur: NEGATIVE mg/dL
Nitrite: NEGATIVE
Protein, ur: >=300 mg/dL — AB
Specific Gravity, Urine: 1.014 (ref 1.005–1.030)
pH: 5 (ref 5.0–8.0)

## 2019-09-26 LAB — CBC WITH DIFFERENTIAL/PLATELET
Abs Immature Granulocytes: 0.04 10*3/uL (ref 0.00–0.07)
Basophils Absolute: 0 10*3/uL (ref 0.0–0.1)
Basophils Relative: 1 %
Eosinophils Absolute: 0.1 10*3/uL (ref 0.0–0.5)
Eosinophils Relative: 1 %
HCT: 28.1 % — ABNORMAL LOW (ref 36.0–46.0)
Hemoglobin: 8.4 g/dL — ABNORMAL LOW (ref 12.0–15.0)
Immature Granulocytes: 1 %
Lymphocytes Relative: 17 %
Lymphs Abs: 1.1 10*3/uL (ref 0.7–4.0)
MCH: 27 pg (ref 26.0–34.0)
MCHC: 29.9 g/dL — ABNORMAL LOW (ref 30.0–36.0)
MCV: 90.4 fL (ref 80.0–100.0)
Monocytes Absolute: 0.6 10*3/uL (ref 0.1–1.0)
Monocytes Relative: 9 %
Neutro Abs: 4.7 10*3/uL (ref 1.7–7.7)
Neutrophils Relative %: 71 %
Platelets: 288 10*3/uL (ref 150–400)
RBC: 3.11 MIL/uL — ABNORMAL LOW (ref 3.87–5.11)
RDW: 17.4 % — ABNORMAL HIGH (ref 11.5–15.5)
WBC: 6.5 10*3/uL (ref 4.0–10.5)
nRBC: 1.1 % — ABNORMAL HIGH (ref 0.0–0.2)

## 2019-09-26 LAB — GLUCOSE, CAPILLARY: Glucose-Capillary: 119 mg/dL — ABNORMAL HIGH (ref 70–99)

## 2019-09-26 LAB — SARS CORONAVIRUS 2 BY RT PCR (HOSPITAL ORDER, PERFORMED IN ~~LOC~~ HOSPITAL LAB): SARS Coronavirus 2: NEGATIVE

## 2019-09-26 LAB — MAGNESIUM: Magnesium: 2.1 mg/dL (ref 1.7–2.4)

## 2019-09-26 LAB — BRAIN NATRIURETIC PEPTIDE: B Natriuretic Peptide: 1420.6 pg/mL — ABNORMAL HIGH (ref 0.0–100.0)

## 2019-09-26 LAB — CBC
HCT: 30 % — ABNORMAL LOW (ref 36.0–46.0)
Hemoglobin: 8.8 g/dL — ABNORMAL LOW (ref 12.0–15.0)
MCH: 26.6 pg (ref 26.0–34.0)
MCHC: 29.3 g/dL — ABNORMAL LOW (ref 30.0–36.0)
MCV: 90.6 fL (ref 80.0–100.0)
Platelets: 281 10*3/uL (ref 150–400)
RBC: 3.31 MIL/uL — ABNORMAL LOW (ref 3.87–5.11)
RDW: 17.4 % — ABNORMAL HIGH (ref 11.5–15.5)
WBC: 6.2 10*3/uL (ref 4.0–10.5)
nRBC: 1 % — ABNORMAL HIGH (ref 0.0–0.2)

## 2019-09-26 LAB — CBG MONITORING, ED: Glucose-Capillary: 143 mg/dL — ABNORMAL HIGH (ref 70–99)

## 2019-09-26 LAB — TROPONIN I (HIGH SENSITIVITY)
Troponin I (High Sensitivity): 125 ng/L (ref <18)
Troponin I (High Sensitivity): 123 ng/L (ref <18)
Troponin I (High Sensitivity): 130 ng/L (ref <18)

## 2019-09-26 LAB — TSH: TSH: 3.156 u[IU]/mL (ref 0.350–4.500)

## 2019-09-26 LAB — HEMOGLOBIN A1C
Hgb A1c MFr Bld: 7.1 % — ABNORMAL HIGH (ref 4.8–5.6)
Mean Plasma Glucose: 157.07 mg/dL

## 2019-09-26 MED ORDER — GABAPENTIN 100 MG PO CAPS
100.0000 mg | ORAL_CAPSULE | Freq: Every day | ORAL | Status: DC
  Administered 2019-09-26 – 2019-10-06 (×11): 100 mg via ORAL
  Filled 2019-09-26 (×11): qty 1

## 2019-09-26 MED ORDER — DULOXETINE HCL 30 MG PO CPEP
30.0000 mg | ORAL_CAPSULE | Freq: Every day | ORAL | Status: DC
  Administered 2019-09-26 – 2019-10-06 (×11): 30 mg via ORAL
  Filled 2019-09-26 (×12): qty 1

## 2019-09-26 MED ORDER — ATORVASTATIN CALCIUM 80 MG PO TABS
80.0000 mg | ORAL_TABLET | Freq: Every day | ORAL | Status: DC
  Administered 2019-09-26 – 2019-10-06 (×11): 80 mg via ORAL
  Filled 2019-09-26 (×11): qty 1

## 2019-09-26 MED ORDER — PANTOPRAZOLE SODIUM 20 MG PO TBEC
20.0000 mg | DELAYED_RELEASE_TABLET | Freq: Every day | ORAL | Status: DC
  Administered 2019-09-27 – 2019-10-07 (×11): 20 mg via ORAL
  Filled 2019-09-26 (×11): qty 1

## 2019-09-26 MED ORDER — FUROSEMIDE 10 MG/ML IJ SOLN: 40.0000 mg | Freq: Once | INTRAMUSCULAR | Status: DC

## 2019-09-26 MED ORDER — SODIUM CHLORIDE 0.9 % IV SOLN
250.0000 mL | INTRAVENOUS | Status: DC | PRN
  Administered 2019-09-28 – 2019-10-02 (×4): 250 mL via INTRAVENOUS

## 2019-09-26 MED ORDER — ALLOPURINOL 300 MG PO TABS
300.0000 mg | ORAL_TABLET | Freq: Every day | ORAL | Status: DC
  Administered 2019-09-26: 300 mg via ORAL
  Filled 2019-09-26 (×2): qty 1

## 2019-09-26 MED ORDER — PRIMIDONE 50 MG PO TABS
50.0000 mg | ORAL_TABLET | Freq: Every day | ORAL | Status: DC
  Administered 2019-09-26 – 2019-10-06 (×11): 50 mg via ORAL
  Filled 2019-09-26 (×12): qty 1

## 2019-09-26 MED ORDER — METOCLOPRAMIDE HCL 5 MG PO TABS: 5.0000 mg | ORAL_TABLET | Freq: Three times a day (TID) | ORAL | Status: DC | PRN

## 2019-09-26 MED ORDER — ACETAMINOPHEN 325 MG PO TABS
650.0000 mg | ORAL_TABLET | ORAL | Status: DC | PRN
  Administered 2019-10-03 – 2019-10-07 (×2): 650 mg via ORAL
  Filled 2019-09-26 (×2): qty 2

## 2019-09-26 MED ORDER — INSULIN GLARGINE 100 UNIT/ML ~~LOC~~ SOLN
15.0000 [IU] | Freq: Every day | SUBCUTANEOUS | Status: DC
  Administered 2019-09-26 – 2019-09-30 (×6): 15 [IU] via SUBCUTANEOUS
  Filled 2019-09-26 (×6): qty 0.15

## 2019-09-26 MED ORDER — NITROGLYCERIN 0.4 MG SL SUBL: 0.4000 mg | SUBLINGUAL_TABLET | SUBLINGUAL | Status: DC | PRN

## 2019-09-26 MED ORDER — FUROSEMIDE 10 MG/ML IJ SOLN: 40.0000 mg | Freq: Two times a day (BID) | INTRAMUSCULAR | Status: DC

## 2019-09-26 MED ORDER — ONDANSETRON HCL 4 MG/2ML IJ SOLN: 4.0000 mg | Freq: Four times a day (QID) | INTRAMUSCULAR | Status: DC | PRN

## 2019-09-26 MED ORDER — FUROSEMIDE 10 MG/ML IJ SOLN
80.0000 mg | Freq: Two times a day (BID) | INTRAMUSCULAR | Status: DC
  Administered 2019-09-26 – 2019-09-27 (×2): 80 mg via INTRAVENOUS
  Filled 2019-09-26 (×2): qty 8

## 2019-09-26 MED ORDER — INSULIN ASPART 100 UNIT/ML ~~LOC~~ SOLN: 0.0000 [IU] | Freq: Every day | SUBCUTANEOUS | Status: DC

## 2019-09-26 MED ORDER — LOPERAMIDE HCL 2 MG PO CAPS: 2.0000 mg | ORAL_CAPSULE | ORAL | Status: DC | PRN

## 2019-09-26 MED ORDER — HEPARIN SODIUM (PORCINE) 5000 UNIT/ML IJ SOLN
5000.0000 [IU] | Freq: Three times a day (TID) | INTRAMUSCULAR | Status: DC
  Administered 2019-09-26 – 2019-09-30 (×13): 5000 [IU] via SUBCUTANEOUS
  Filled 2019-09-26 (×13): qty 1

## 2019-09-26 MED ORDER — ASPIRIN EC 81 MG PO TBEC
81.0000 mg | DELAYED_RELEASE_TABLET | Freq: Every morning | ORAL | Status: DC
  Administered 2019-09-27 – 2019-10-07 (×11): 81 mg via ORAL
  Filled 2019-09-26 (×11): qty 1

## 2019-09-26 MED ORDER — SODIUM CHLORIDE 0.9% FLUSH: 3.0000 mL | INTRAVENOUS | Status: DC | PRN

## 2019-09-26 MED ORDER — CARVEDILOL 25 MG PO TABS
25.0000 mg | ORAL_TABLET | Freq: Two times a day (BID) | ORAL | Status: DC
  Administered 2019-09-26 – 2019-09-28 (×4): 25 mg via ORAL
  Filled 2019-09-26 (×3): qty 1
  Filled 2019-09-26: qty 8
  Filled 2019-09-26: qty 1

## 2019-09-26 MED ORDER — INSULIN ASPART 100 UNIT/ML ~~LOC~~ SOLN
0.0000 [IU] | Freq: Three times a day (TID) | SUBCUTANEOUS | Status: DC
  Administered 2019-09-26 – 2019-09-27 (×4): 1 [IU] via SUBCUTANEOUS

## 2019-09-26 MED ORDER — SODIUM CHLORIDE 0.9% FLUSH
3.0000 mL | Freq: Two times a day (BID) | INTRAVENOUS | Status: DC
  Administered 2019-09-26 – 2019-10-06 (×13): 3 mL via INTRAVENOUS

## 2019-09-26 NOTE — ED Notes (Signed)
Tele Dinner ordered 

## 2019-09-26 NOTE — Telephone Encounter (Signed)
See 6/23 phone note

## 2019-09-26 NOTE — Telephone Encounter (Signed)
Spoke to [employer] providing care to [place] in the ED and informed her of the information that patient has not been compliant with medications in one week and that I gave patient morning medication yesterday. I also explained to RN that I had reached out to Dr. Marlou Porch office in attempt to seek treatment plan to avoid ED visit however patient's shortness of breath and swelling worsened. I will follow up with patient in one week.  Call complete.

## 2019-09-26 NOTE — ED Provider Notes (Signed)
[facility] EMERGENCY DEPARTMENT Provider Note   CSN: [id number] Arrival date & time: 09/26/19  1159     History Chief Complaint  Patient presents with  . Shortness of Breath  . Leg Swelling    Kara Hanson is a 64 y.o. female with past medical history of CKD, CHF, CVA, hypertension, CAD, type 2 diabetes, COVID-19, presenting to the emergency department with 1 week of worsening shortness of breath and peripheral edema.  She states over the course of the week, she has had worsening dyspnea on exertion, orthopnea, and swelling in her legs extending into her abdomen.  She states this feels very similar to prior CHF exacerbation.  She has been taking 160 mg of Lasix in the morning, over the last few days, this is doubled from her usual 80 mg daily.  She has not missed any doses.  She states she does feel that all of her medicines gave her an episode of diarrhea yesterday.  She reports a brief episode of left-sided sharp chest pain yesterday that only lasted moments in duration.  Reports 6 pound weight gain.  Per chart review, patient had echocardiogram done in January 2021, EF of 40 to 45%.  Last seen by Dr. Marlou Porch with cardiology, in April of this year.  The history is provided by the patient.       Past Medical History:  Diagnosis Date  . Acute CHF (congestive heart failure) ([place]) 09/24/2018  . Acute on chronic combined systolic and diastolic CHF (congestive heart failure) ([place]) 11/27/2017  . Acute on chronic respiratory failure with hypoxia ([place]) 03/20/2018  . Acute renal failure superimposed on stage 3 chronic kidney disease ([place])   . Acute respiratory distress 11/27/2017  . Acute respiratory failure with hypoxia ([place]) 02/09/2019  . Acute systolic CHF (congestive heart failure) ([place]) 05/19/2019  . AKI (acute kidney injury) ([place]) 11/27/2017  . Atelectasis   . Bulging lumbar disc 11/14/2017  . CAP (community acquired pneumonia) 10/31/2018  . Chest pain 06/17/2016  .  CHF (congestive heart failure) ([place])   . Chronic diastolic CHF (congestive heart failure) ([place]) 07/20/2018  . Chronic respiratory failure ([place])   . CKD (chronic kidney disease), stage III 03/20/2018  . Controlled type 2 diabetes mellitus with hyperglycemia ([place]) 04/23/2016  . Controlled type 2 diabetes with neuropathy ([place]) 03/20/2018  . COVID-19 virus infection 07/20/2018  . CVA (cerebral vascular accident) ([place])   . Diabetes mellitus without complication ([place])   . Dyspnea   . Elevated troponin 04/18/2019  . Epigastric pain   . Gout 11/14/2017  . Hypercholesteremia   . Hyperlipidemia LDL goal <70 09/30/2016  . Hypertension   . Myocardial infarction ([place])   . Normochromic normocytic anemia 04/23/2016  . NSTEMI (non-ST elevated myocardial infarction) ([place]) 02/09/2019  . Pneumonia 11/01/2018  . Pneumonia due to COVID-19 virus 07/20/2018  . S/P thoracentesis   . SIRS (systemic inflammatory response syndrome) ([place]) 07/25/2018  . Spinal stenosis   . Stress-induced cardiomyopathy 05/19/2016  . Syncope 04/23/2016  . Type 2 diabetes mellitus with diabetic neuropathy, unspecified ([place]) 05/06/2016  . Vertigo 05/06/2016  . Vitamin D deficiency 04/02/2018    Patient Active Problem List   Diagnosis Date Noted  . Hyponatremia 09/26/2019  . CHF exacerbation ([place]) 09/26/2019  . Acute on chronic systolic CHF (congestive heart failure) ([place]) 05/19/2019  . Acute exacerbation of CHF (congestive heart failure) ([place]) 04/18/2019  . Elevated troponin 04/18/2019  . Acute respiratory failure with hypoxia ([place]) 02/09/2019  . NSTEMI (  non-ST elevated myocardial infarction) ([place]) 02/09/2019  . S/P thoracentesis   . Atelectasis   . Chronic respiratory failure ([place])   . Acute renal failure superimposed on stage 3 chronic kidney disease ([place])   . Epigastric pain   . CHF (congestive heart failure) ([place]) 01/23/2019  . CAP (community acquired pneumonia) 10/31/2018  . Acute CHF (congestive heart failure) ([place]) 09/24/2018    . SIRS (systemic inflammatory response syndrome) ([place]) 07/25/2018  . COVID-19 virus infection 07/20/2018  . Chronic diastolic CHF (congestive heart failure) ([place]) 07/20/2018  . Pneumonia due to COVID-19 virus 07/20/2018  . Vitamin D deficiency 04/02/2018  . Spinal stenosis 03/29/2018  . Acute on chronic respiratory failure with hypoxia ([place]) 03/20/2018  . Controlled type 2 diabetes with neuropathy ([place]) 03/20/2018  . CKD (chronic kidney disease), stage III 03/20/2018  . Acute on chronic combined systolic and diastolic CHF (congestive heart failure) ([place]) 11/27/2017  . AKI (acute kidney injury) ([place]) 11/27/2017  . Acute respiratory distress 11/27/2017  . Bulging lumbar disc 11/14/2017  . Gout 11/14/2017  . Hyperlipidemia LDL goal <70 09/30/2016  . Chest pain 06/17/2016  . Stress-induced cardiomyopathy 05/19/2016  . Type 2 diabetes mellitus with diabetic neuropathy, unspecified ([place]) 05/06/2016  . Vertigo 05/06/2016  . Controlled type 2 diabetes mellitus with hyperglycemia ([place]) 04/23/2016  . Hypertension 04/23/2016  . Normochromic normocytic anemia 04/23/2016  . Syncope 04/23/2016    Past Surgical History:  Procedure Laterality Date  . BIOPSY  01/27/2019   Procedure: BIOPSY;  Surgeon: Otis Brace, MD;  Location: [facility];  Service: Gastroenterology;;  . BLADDER SURGERY    . CARDIAC CATHETERIZATION N/A 04/25/2016   Procedure: Left Heart Cath and Coronary Angiography;  Surgeon: Lorretta Harp, MD;  Location: [facility];  Service: Cardiovascular;  Laterality: N/A;  . CARDIAC CATHETERIZATION  02/11/2019  . CESAREAN SECTION    . CHOLECYSTECTOMY    . ESOPHAGOGASTRODUODENOSCOPY (EGD) WITH PROPOFOL N/A 01/27/2019   Procedure: ESOPHAGOGASTRODUODENOSCOPY (EGD) WITH PROPOFOL;  Surgeon: Otis Brace, MD;  Location: [facility];  Service: Gastroenterology;  Laterality: N/A;  . RIGHT/LEFT HEART CATH AND CORONARY ANGIOGRAPHY N/A 02/11/2019   Procedure: RIGHT/LEFT HEART  CATH AND CORONARY ANGIOGRAPHY;  Surgeon: Jolaine Artist, MD;  Location: [facility];  Service: Cardiovascular;  Laterality: N/A;  . VIDEO BRONCHOSCOPY N/A 02/01/2019   Procedure: VIDEO BRONCHOSCOPY WITHOUT FLUORO;  Surgeon: Candee Furbish, MD;  Location: [facility];  Service: Endoscopy;  Laterality: N/A;     OB History   No obstetric history on file.     Family History  Problem Relation Age of Onset  . Diabetes Mellitus II Father   . Stroke Father   . Healthy Mother        She is 51 years old.     Social History   Tobacco Use  . Smoking status: Never Smoker  . Smokeless tobacco: Never Used  Vaping Use  . Vaping Use: Never used  Substance Use Topics  . Alcohol use: No  . Drug use: No    Home Medications Prior to Admission medications   Medication Sig Start Date End Date Taking? Authorizing Provider  acetaminophen (TYLENOL) 500 MG tablet Take 500-1,000 mg by mouth every 6 (six) hours as needed for mild pain or headache.   Yes [provider]  allopurinol (ZYLOPRIM) 300 MG tablet Take 300 mg by mouth at bedtime.    Yes [provider]  aspirin EC 81 MG tablet Take 81 mg by mouth in  the morning.    Yes [provider]  atorvastatin (LIPITOR) 80 MG tablet Take 80 mg by mouth at bedtime.    Yes [provider]  carvedilol (COREG) 25 MG tablet TAKE 1 TABLET (25 MG TOTAL) BY MOUTH 2 (TWO) TIMES DAILY WITH A MEAL. 09/26/19  Yes [name], MD  DULoxetine (CYMBALTA) 30 MG capsule Take 1 capsule (30 mg total) by mouth daily. Patient taking differently: Take 30 mg by mouth at bedtime.  02/06/19  Yes Dana Allan I, MD  ergocalciferol (VITAMIN D2) 1.25 MG (50000 UT) capsule Take 50,000 Units by mouth every Sunday.  06/26/19  Yes [provider]  furosemide (LASIX) 80 MG tablet TAKE 1 TABLET (80 MG TOTAL) BY MOUTH 2 (TWO) TIMES DAILY. Patient taking differently: Take 80 mg by mouth in the morning and at bedtime.  08/01/19   Yes [name], MD  gabapentin (NEURONTIN) 100 MG capsule Take 1 capsule (100 mg total) by mouth at bedtime. Patient taking differently: Take 100 mg by mouth in the morning and at bedtime.  05/22/19  Yes Vann, [name] U, DO  insulin aspart (NOVOLOG) 100 UNIT/ML injection 0 to 12 units subcutaneously 3 times daily before meals as per sliding scale Patient taking differently: Inject 0-12 Units into the skin See admin instructions. Inject 0-12 units into the skin three times a day before meals, per sliding scale 02/27/19  Yes Newlin, Enobong, MD  Insulin Glargine (BASAGLAR KWIKPEN) 100 UNIT/ML SOPN Inject 0.15 mLs (15 Units total) into the skin at bedtime. 05/22/19  Yes Vann, [name] U, DO  lansoprazole (PREVACID) 15 MG capsule TAKE 1 CAPSULE (15 MG TOTAL) BY MOUTH DAILY. Patient taking differently: Take 15 mg by mouth daily before breakfast.  09/05/19  Yes Newlin, Enobong, MD  metoCLOPramide (REGLAN) 5 MG tablet Take 1 tablet (5 mg total) by mouth 3 (three) times daily as needed for nausea. Patient taking differently: Take 5 mg by mouth in the morning and at bedtime.  08/08/19 08/07/20 Yes [name], MD  Multiple Vitamins-Minerals (MULTIVITAMIN WITH MINERALS) tablet Take 1 tablet by mouth daily with breakfast.    Yes [provider]  OXYGEN Inhale 4 L/min into the lungs continuous.   Yes [provider]  polyethylene glycol (MIRALAX / GLYCOLAX) 17 g packet Take 17 g by mouth daily as needed for moderate constipation. Patient taking differently: Take 17 g by mouth daily as needed for mild constipation or moderate constipation (MIX AND DRINK).  05/22/19  Yes Geradine Girt, DO  primidone (MYSOLINE) 50 MG tablet Take 1 tablet (50 mg total) by mouth in the morning and at bedtime. 08/19/19  Yes Tat, Eustace Quail, DO  sodium chloride (OCEAN) 0.65 % SOLN nasal spray Place 1 spray into both nostrils as needed for congestion.    Yes [provider]  SUPER B COMPLEX/C CAPS Take 1  capsule by mouth daily.    Yes [provider]  Accu-Chek FastClix Lancets MISC USE AS DIRECTED TO TEST BLOOD SUGAR THREE TIMES DAILY 06/10/19   [name], MD  Blood Glucose Monitoring Suppl (ACCU-CHEK GUIDE) w/Device KIT 1 each by Does not apply route 3 (three) times daily. 06/10/19   [name], MD  EASY COMFORT PEN NEEDLES 31G X 5 MM MISC USE FOUR TIMES PER DAY FOR INSULIN ADMINISTRATION Patient taking differently: 4 (four) times daily.  10/02/18   [name], MD  glucose blood (ACCU-CHEK GUIDE) test strip USE AS DIRECTED TO TEST BLOOD SUGAR THREE TIMES DAILY 06/10/19  [name], MD  Lancet Device MISC Use as instructed 3 times daily 04/26/19   [name], MD  Misc. Devices MISC Portable oxygen concentrator.  Diagnosis-chronic respiratory failure. 09/03/18   [name], MD  Misc. Devices MISC [place]or with seat. Dx: Congestive Heart Failure 10/19/18   [name], MD  Misc. Devices MISC Scale; Dx - CHF 06/10/19   [name], MD  Misc. [facility] bed.  Diagnosis CHF.  Lifetime use.  Weight 165 lbs. 07/10/19   [name], MD    Allergies    Garlic, Isosorbide, Latex, Morphine and related, and Other  Review of Systems   Review of Systems  Respiratory: Positive for chest tightness and shortness of breath.   Cardiovascular: Positive for chest pain and leg swelling.  Gastrointestinal: Positive for abdominal distention.  All other systems reviewed and are negative.   Physical Exam Updated Vital Signs BP 135/76   Pulse 96   Temp (!) 97.2 F (36.2 C)   Resp 19   SpO2 97%   Physical Exam Vitals and nursing note reviewed.  Constitutional:      General: She is not in acute distress.    Appearance: She is well-developed.  HENT:     Head: Normocephalic and atraumatic.  Eyes:     Conjunctiva/sclera: Conjunctivae normal.  Cardiovascular:     Rate and Rhythm: Normal rate and regular rhythm.     Heart sounds: Murmur heard.   Pulmonary:      Effort: Pulmonary effort is normal.     Breath sounds: Examination of the right-lower field reveals rales. Examination of the left-lower field reveals rales. Rales present.  Abdominal:     Palpations: Abdomen is soft.     Comments: Abdomen is distended.  Musculoskeletal:     Right lower leg: Edema present.     Left lower leg: Edema present.     Comments: Bilateral 3+ pitting edema to lower extremities.  Skin:    General: Skin is warm.  Neurological:     Mental Status: She is alert.  Psychiatric:        Behavior: Behavior normal.     ED Results / Procedures / Treatments   Labs (all labs ordered are listed, but only abnormal results are displayed) Labs Reviewed  BRAIN NATRIURETIC PEPTIDE - Abnormal; Notable for the following components:      Result Value   B Natriuretic Peptide 1,420.6 (*)    All other components within normal limits  COMPREHENSIVE METABOLIC PANEL - Abnormal; Notable for the following components:   Sodium 131 (*)    Chloride 97 (*)    Glucose, Bld 219 (*)    BUN 58 (*)    Creatinine, Ser 2.56 (*)    Calcium 8.6 (*)    Total Protein 5.6 (*)    Albumin 2.8 (*)    GFR calc non Af Amer 19 (*)    GFR calc Af Amer 22 (*)    All other components within normal limits  CBC WITH DIFFERENTIAL/PLATELET - Abnormal; Notable for the following components:   RBC 3.11 (*)    Hemoglobin 8.4 (*)    HCT 28.1 (*)    MCHC 29.9 (*)    RDW 17.4 (*)    nRBC 1.1 (*)    All other components within normal limits  CBG MONITORING, ED - Abnormal; Notable for the following components:   Glucose-Capillary 143 (*)    All other components within normal limits  TROPONIN I (HIGH SENSITIVITY) - Abnormal;  Notable for the following components:   Troponin I (High Sensitivity) 125 (*)    All other components within normal limits  SARS CORONAVIRUS 2 BY RT PCR (HOSPITAL ORDER, [facility])  MAGNESIUM  CBC WITH DIFFERENTIAL/PLATELET  CBC  CREATININE, SERUM    TSH  HEMOGLOBIN A1C  URINALYSIS, ROUTINE W REFLEX MICROSCOPIC  TROPONIN I (HIGH SENSITIVITY)    EKG EKG Interpretation  Date/Time:  Thursday September 26 2019 12:15:04 EDT Ventricular Rate:  68 PR Interval:    QRS Duration: 89 QT Interval:  421 QTC Calculation: 448 R Axis:   98 Text Interpretation: Sinus rhythm Right axis deviation Repol abnrm suggests ischemia, anterolateral Diffuse t wave inversions wimilar to prior. No STEMI Confirmed by Antony Blackbird [phone]) on 09/26/2019 12:58:00 PM   Radiology DG Chest Portable 1 View  Result Date: 09/26/2019 CLINICAL DATA:  Chest pain shortness of breath and leg EXAM: PORTABLE CHEST 1 VIEW COMPARISON:  July 01, 2019 FINDINGS: There is unchanged cardiomegaly. There is diffusely increased interstitial markings seen throughout lungs. Trace blunting of the right costophrenic angle likely trace effusion. Shallow degree aeration with subsegmental atelectasis. No acute osseous abnormality. IMPRESSION: Mild cardiomegaly and interstitial edema. Trace right pleural effusion. Electronically Signed   By: Prudencio Pair M.D.   On: 09/26/2019 14:16    Procedures Procedures (including critical care time)  Medications Ordered in ED Medications  sodium chloride flush (NS) 0.9 % injection 3 mL (has no administration in time range)  sodium chloride flush (NS) 0.9 % injection 3 mL (has no administration in time range)  0.9 %  sodium chloride infusion (has no administration in time range)  acetaminophen (TYLENOL) tablet 650 mg (has no administration in time range)  ondansetron (ZOFRAN) injection 4 mg (has no administration in time range)  heparin injection 5,000 Units (has no administration in time range)  insulin aspart (novoLOG) injection 0-5 Units (has no administration in time range)  insulin aspart (novoLOG) injection 0-9 Units (has no administration in time range)  nitroGLYCERIN (NITROSTAT) SL tablet 0.4 mg (has no administration in time range)  aspirin EC  tablet 81 mg (has no administration in time range)  allopurinol (ZYLOPRIM) tablet 300 mg (has no administration in time range)  atorvastatin (LIPITOR) tablet 80 mg (has no administration in time range)  carvedilol (COREG) tablet 25 mg (has no administration in time range)  DULoxetine (CYMBALTA) DR capsule 30 mg (has no administration in time range)  Basaglar KwikPen KwikPen 15 Units (has no administration in time range)  pantoprazole (PROTONIX) EC tablet 20 mg (has no administration in time range)  metoCLOPramide (REGLAN) tablet 5 mg (has no administration in time range)  gabapentin (NEURONTIN) capsule 100 mg (has no administration in time range)  primidone (MYSOLINE) tablet 50 mg (has no administration in time range)  furosemide (LASIX) injection 80 mg (has no administration in time range)  loperamide (IMODIUM) capsule 2 mg (has no administration in time range)    ED Course  I have reviewed the triage vital signs and the nursing notes.  Pertinent labs & imaging results that were available during my care of the patient were reviewed by me and considered in my medical decision making (see chart for details).  Clinical Course as of Sep 25 1644  Thu Sep 26, 2019  1303 Called to bedside for concern for syncopal episode with asystole. Cardiac monitoring reviewed with Dr. Sherry Ruffing. It appears a cardiac lead must have come off, does not appear to have had an  episode of asystole. Pads in place as a precaution.    [JR]  3754 Up from 2 months ago.  Creatinine(!): 2.56 [JR]  1445 Demand ischemia?  Troponin I (High Sensitivity)(!!): 125 [JR]  1445 Magnesium [JR]    Clinical Course User Index [JR] Kimber Fritts, [place] N, PA-C   MDM Rules/Calculators/A&P                          Patient presenting to the ED with presentation consistent with CHF exacerbation.  She has had 6 pound weight gain as well as worsening edema and shortness of breath, both dyspnea on exertion and orthopnea.  She recently  doubled her Lasix dose in the morning to 160 mg for the last few days, however it is not working.  On exam she has fine crackles in the bases, chest x-ray consistent with fluid overload.  Peripheral edema is present, abdominal distention as well.  Troponin is elevated at 125 though question if this is more demand ischemia.  She endorses a brief episode of left-sided sharp chest pain yesterday lasting only seconds in duration.  None since.  Her metabolic panel shows an AKI with creatinine of 2.6, up from 1.8 two months ago.  This is likely from doubling her Lasix dose this week.  She is chronically on oxygen via nasal cannula.  Given patient's AKI, worsening symptoms of CHF exacerbation, she is clinically fluid overloaded, patient will require admission for further management.    Dr. Doristine Bosworth accepting admission. Final Clinical Impression(s) / ED Diagnoses Final diagnoses:  Acute on chronic congestive heart failure, unspecified heart failure type ([place])  AKI (acute kidney injury) [facility])    Rx / DC Orders ED Discharge Orders    None       Cyann Venti, [place] N, PA-C 09/26/19 1646    Tegeler, Gwenyth Allegra, MD 09/26/19 2149

## 2019-09-26 NOTE — H&P (Addendum)
History and Physical    Kara Hanson YTK:[id number] DOB: October 08, 1955 DOA: 09/26/2019  PCP: Kara Rakes, MD  Patient coming from: Home I have personally briefly reviewed patient's old medical records in [place]  Chief Complaint: Shortness of breath since 1 week  HPI: Kara Hanson is a 64 y.o. female with medical history significant of hypertension, hyperlipidemia, insulin-dependent diabetes mellitus, systolic congestive heart failure with ejection fraction of 40 to 45%, morbid obesity, CKD stage IIIb, chronic hypoxemic respiratory failure-on 4 L of oxygen via nasal cannulae at home presents to emergency department due to worsening shortness of breath, leg edema since 1 week.  Patient tells me that she started having shortness of breath a week ago associated with leg swelling, orthopnea, PND, weight gain of 6 pounds.  Reports midsternal chest pain which is constant, 8 out of 10, nonradiating, no aggravating or relieving factors.  She tells me that she has been compliant with her medication-including Lasix 80 mg BID however As per EMS caregiver-she has not been taking her lasix since 1 week?Marland Kitchen  She tells me that her urine output has been low since 2 days.  No dysuria, hematuria or foul-smelling urine.  Reports watery diarrhea since 2 days which is improving.  No melena, nausea, vomiting, fever, chills, abdominal pain or decreased appetite.  She is followed by cardiology Dr. Lorel Monaco seen in April 2021. denies headache, lightheadedness, dizziness, cough, congestion, generalized weakness or lethargy.  She lives with her daughter at home.  No history of smoking, alcohol, street drug use.  ED Course: Upon arrival to ED: Patient's vital signs stable.  She is afebrile with no leukocytosis.  CBC shows anemia of chronic disease.  CMP shows worsening kidney function.  BNP elevated at 1420, troponin: 125, EKG shows sinus rhythm, right axis deviation, diffuse T wave inversion similar to  previous EKG.  No ST elevation or depression noted.  COVID-19 pending, sodium 131, chloride: 97, chest x-ray shows mild cardiomegaly and interstitial edema, trace right pleural effusion. [facility] hospital[facility] consulted for admission for acute on chronic systolic CHF exacerbation.  Review of Systems: As per HPI otherwise negative.    Past Medical History:  Diagnosis Date  . Acute CHF (congestive heart failure) ([place]) 09/24/2018  . Acute on chronic combined systolic and diastolic CHF (congestive heart failure) ([place]) 11/27/2017  . Acute on chronic respiratory failure with hypoxia ([place]) 03/20/2018  . Acute renal failure superimposed on stage 3 chronic kidney disease ([place])   . Acute respiratory distress 11/27/2017  . Acute respiratory failure with hypoxia ([place]) 02/09/2019  . Acute systolic CHF (congestive heart failure) ([place]) 05/19/2019  . AKI (acute kidney injury) ([place]) 11/27/2017  . Atelectasis   . Bulging lumbar disc 11/14/2017  . CAP (community acquired pneumonia) 10/31/2018  . Chest pain 06/17/2016  . CHF (congestive heart failure) ([place])   . Chronic diastolic CHF (congestive heart failure) ([place]) 07/20/2018  . Chronic respiratory failure ([place])   . CKD (chronic kidney disease), stage III 03/20/2018  . Controlled type 2 diabetes mellitus with hyperglycemia ([place]) 04/23/2016  . Controlled type 2 diabetes with neuropathy ([place]) 03/20/2018  . COVID-19 virus infection 07/20/2018  . CVA (cerebral vascular accident) ([place])   . Diabetes mellitus without complication ([place])   . Dyspnea   . Elevated troponin 04/18/2019  . Epigastric pain   . Gout 11/14/2017  . Hypercholesteremia   . Hyperlipidemia LDL goal <70 09/30/2016  . Hypertension   . Myocardial infarction ([place])   . Normochromic normocytic anemia 04/23/2016  .  NSTEMI (non-ST elevated myocardial infarction) ([place]) 02/09/2019  . Pneumonia 11/01/2018  . Pneumonia due to COVID-19 virus 07/20/2018  . S/P thoracentesis   . SIRS (systemic inflammatory response  syndrome) ([place]) 07/25/2018  . Spinal stenosis   . Stress-induced cardiomyopathy 05/19/2016  . Syncope 04/23/2016  . Type 2 diabetes mellitus with diabetic neuropathy, unspecified ([place]) 05/06/2016  . Vertigo 05/06/2016  . Vitamin D deficiency 04/02/2018    Past Surgical History:  Procedure Laterality Date  . BIOPSY  01/27/2019   Procedure: BIOPSY;  Surgeon: Otis Brace, MD;  Location: [facility];  Service: Gastroenterology;;  . BLADDER SURGERY    . CARDIAC CATHETERIZATION N/A 04/25/2016   Procedure: Left Heart Cath and Coronary Angiography;  Surgeon: Lorretta Harp, MD;  Location: [facility];  Service: Cardiovascular;  Laterality: N/A;  . CARDIAC CATHETERIZATION  02/11/2019  . CESAREAN SECTION    . CHOLECYSTECTOMY    . ESOPHAGOGASTRODUODENOSCOPY (EGD) WITH PROPOFOL N/A 01/27/2019   Procedure: ESOPHAGOGASTRODUODENOSCOPY (EGD) WITH PROPOFOL;  Surgeon: Otis Brace, MD;  Location: [facility];  Service: Gastroenterology;  Laterality: N/A;  . RIGHT/LEFT HEART CATH AND CORONARY ANGIOGRAPHY N/A 02/11/2019   Procedure: RIGHT/LEFT HEART CATH AND CORONARY ANGIOGRAPHY;  Surgeon: Jolaine Artist, MD;  Location: [facility];  Service: Cardiovascular;  Laterality: N/A;  . VIDEO BRONCHOSCOPY N/A 02/01/2019   Procedure: VIDEO BRONCHOSCOPY WITHOUT FLUORO;  Surgeon: Candee Furbish, MD;  Location: [facility];  Service: Endoscopy;  Laterality: N/A;     reports that she has never smoked. She has never used smokeless tobacco. She reports that she does not drink alcohol and does not use drugs.  Allergies  Allergen Reactions  . Garlic Shortness Of Breath, Itching and Swelling    Raw*  Hand itching and swelling  . Latex Itching  . Morphine And Related Itching and Other (See Comments)    Headache   . Other Itching    Reaction to newspaper ink -itching and headache    Family History  Problem Relation Age of Onset  . Diabetes Mellitus II Father   . Stroke Father   .  Healthy Mother        She is 25 years old.     Prior to Admission medications   Medication Sig Start Date End Date Taking? Authorizing Provider  Accu-Chek FastClix Lancets MISC USE AS DIRECTED TO TEST BLOOD SUGAR THREE TIMES DAILY 06/10/19   Kara Rakes, MD  allopurinol (ZYLOPRIM) 300 MG tablet Take 300 mg by mouth daily.    [provider]  aspirin EC 81 MG tablet Take 81 mg by mouth daily.    [provider]  atorvastatin (LIPITOR) 80 MG tablet Take 80 mg by mouth daily at 6 PM.    [provider]  Blood Glucose Monitoring Suppl (ACCU-CHEK GUIDE) w/Device KIT 1 each by Does not apply route 3 (three) times daily. 06/10/19   Kara Rakes, MD  carvedilol (COREG) 25 MG tablet TAKE 1 TABLET (25 MG TOTAL) BY MOUTH 2 (TWO) TIMES DAILY WITH A MEAL. 09/26/19   Kara Rakes, MD  DULoxetine (CYMBALTA) 30 MG capsule Take 1 capsule (30 mg total) by mouth daily. 02/06/19   Bonnell Public, MD  EASY COMFORT PEN NEEDLES 31G X 5 MM MISC USE FOUR TIMES PER DAY FOR INSULIN ADMINISTRATION 10/02/18   Kara Rakes, MD  ergocalciferol (VITAMIN D2) 1.25 MG (50000 UT) capsule Take 1 capsule by mouth once a week. Patient not taking: Reported on 09/25/2019 06/26/19  [provider]  furosemide (LASIX) 80 MG tablet TAKE 1 TABLET (80 MG TOTAL) BY MOUTH 2 (TWO) TIMES DAILY. 08/01/19   Kara Rakes, MD  gabapentin (NEURONTIN) 100 MG capsule Take 1 capsule (100 mg total) by mouth at bedtime. Patient taking differently: Take 100 mg by mouth 2 (two) times daily.  05/22/19   Eulogio Bear U, DO  glucose blood (ACCU-CHEK GUIDE) test strip USE AS DIRECTED TO TEST BLOOD SUGAR THREE TIMES DAILY Patient not taking: Reported on 09/25/2019 06/10/19   Kara Rakes, MD  insulin aspart (NOVOLOG) 100 UNIT/ML injection 0 to 12 units subcutaneously 3 times daily before meals as per sliding scale 02/27/19   Kara Rakes, MD  Insulin Glargine (BASAGLAR KWIKPEN) 100 UNIT/ML SOPN Inject 0.15  mLs (15 Units total) into the skin at bedtime. 05/22/19   Geradine Girt, DO  Lancet Device MISC Use as instructed 3 times daily Patient not taking: Reported on 09/25/2019 04/26/19   Kara Rakes, MD  lansoprazole (PREVACID) 15 MG capsule TAKE 1 CAPSULE (15 MG TOTAL) BY MOUTH DAILY. 09/05/19   Kara Rakes, MD  metoCLOPramide (REGLAN) 5 MG tablet Take 1 tablet (5 mg total) by mouth 3 (three) times daily as needed for nausea. 08/08/19 08/07/20  Kara Rakes, MD  Misc. Devices MISC Portable oxygen concentrator.  Diagnosis-chronic respiratory failure. 09/03/18   Kara Rakes, MD  Misc. Devices MISC [place] with seat. Dx: Congestive Heart Failure Patient not taking: Reported on 09/11/2019 10/19/18   Kara Rakes, MD  Misc. Devices MISC Scale; Dx - CHF 06/10/19   Kara Rakes, MD  Misc. [facility] bed.  Diagnosis CHF.  Lifetime use.  Weight 165 lbs. 07/10/19   Kara Rakes, MD  Multiple Vitamins-Minerals (MULTIVITAMIN WITH MINERALS) tablet Take 1 tablet by mouth daily.    [provider]  polyethylene glycol (MIRALAX / GLYCOLAX) 17 g packet Take 17 g by mouth daily as needed for moderate constipation. Patient not taking: Reported on 08/21/2019 05/22/19   Geradine Girt, DO  primidone (MYSOLINE) 50 MG tablet Take 1 tablet (50 mg total) by mouth in the morning and at bedtime. 08/19/19   Tat, Eustace Quail, DO  sodium chloride (OCEAN) 0.65 % SOLN nasal spray Place 1 spray into both nostrils as needed for congestion. Patient not taking: Reported on 09/25/2019    [provider]  SUPER B COMPLEX/C CAPS Take 1 capsule by mouth daily. Patient not taking: Reported on 09/19/2019    [provider]    Physical Exam: Vitals:   09/26/19 1205  BP: 135/76  Pulse: 96  Resp: 19  Temp: (!) 97.2 F (36.2 C)  SpO2: 97%    Constitutional: In mild respiratory distress, on 4 L of oxygen via nasal cannula Eyes: PERRL, lids and conjunctivae normal ENMT: Mucous membranes are dry.  Posterior pharynx clear of any exudate or lesions.Normal dentition.  Neck: normal, supple, no masses, no thyromegaly Respiratory: Decreased breath sounds noted on bases. Cardiovascular: Regular rate and rhythm, no murmurs / rubs / gallops.  2+ pitting edema noted in bilateral lower extremity.  Right more than left.. 2+ pedal pulses. No carotid bruits.  Abdomen: Distended, lower abdominal tenderness positive no masses palpated. No hepatosplenomegaly. Bowel sounds positive.  Musculoskeletal: no clubbing / cyanosis. No joint deformity upper and lower extremities. Good ROM, no contractures. Normal muscle tone.  Skin: no rashes, lesions, ulcers. No induration Neurologic: CN 2-12 grossly intact. Sensation intact, DTR normal. Strength 5/5 in all 4.  Psychiatric: Normal judgment and  insight. Alert and oriented x 3. Normal mood.    Labs on Admission: I have personally reviewed following labs and imaging studies  CBC: Recent Labs  Lab 09/26/19 1427  WBC 6.5  NEUTROABS 4.7  HGB 8.4*  HCT 28.1*  MCV 90.4  PLT 128   Basic Metabolic Panel: Recent Labs  Lab 09/26/19 1334  NA 131*  K 3.8  CL 97*  CO2 24  GLUCOSE 219*  BUN 58*  CREATININE 2.56*  CALCIUM 8.6*  MG 2.1   GFR: Estimated Creatinine Clearance: 18.8 mL/min (A) (by C-G formula based on SCr of 2.56 mg/dL (H)). Liver Function Tests: Recent Labs  Lab 09/26/19 1334  AST 20  ALT 14  ALKPHOS 93  BILITOT 0.5  PROT 5.6*  ALBUMIN 2.8*   No results for input(s): LIPASE, AMYLASE in the last 168 hours. No results for input(s): AMMONIA in the last 168 hours. Coagulation Profile: No results for input(s): INR, PROTIME in the last 168 hours. Cardiac Enzymes: No results for input(s): CKTOTAL, CKMB, CKMBINDEX, TROPONINI in the last 168 hours. BNP (last 3 results) No results for input(s): PROBNP in the last 8760 hours. HbA1C: No results for input(s): HGBA1C in the last 72 hours. CBG: No results for input(s): GLUCAP in the last 168  hours. Lipid Profile: No results for input(s): CHOL, HDL, LDLCALC, TRIG, CHOLHDL, LDLDIRECT in the last 72 hours. Thyroid Function Tests: No results for input(s): TSH, T4TOTAL, FREET4, T3FREE, THYROIDAB in the last 72 hours. Anemia Panel: No results for input(s): VITAMINB12, FOLATE, FERRITIN, TIBC, IRON, RETICCTPCT in the last 72 hours. Urine analysis:    Component Value Date/Time   COLORURINE YELLOW 05/20/2019 1300   APPEARANCEUR HAZY (A) 05/20/2019 1300   LABSPEC 1.011 05/20/2019 1300   PHURINE 6.0 05/20/2019 1300   GLUCOSEU 50 (A) 05/20/2019 1300   HGBUR SMALL (A) 05/20/2019 1300   BILIRUBINUR NEGATIVE 05/20/2019 1300   BILIRUBINUR negative 12/13/2017 1514   KETONESUR NEGATIVE 05/20/2019 1300   PROTEINUR >=300 (A) 05/20/2019 1300   UROBILINOGEN 0.2 12/13/2017 1514   NITRITE NEGATIVE 05/20/2019 1300   LEUKOCYTESUR TRACE (A) 05/20/2019 1300    Radiological Exams on Admission: DG Chest Portable 1 View  Result Date: 09/26/2019 CLINICAL DATA:  Chest pain shortness of breath and leg EXAM: PORTABLE CHEST 1 VIEW COMPARISON:  July 01, 2019 FINDINGS: There is unchanged cardiomegaly. There is diffusely increased interstitial markings seen throughout lungs. Trace blunting of the right costophrenic angle likely trace effusion. Shallow degree aeration with subsegmental atelectasis. No acute osseous abnormality. IMPRESSION: Mild cardiomegaly and interstitial edema. Trace right pleural effusion. Electronically Signed   By: Prudencio Pair M.D.   On: 09/26/2019 14:16    EKG: Independently reviewed.  Sinus rhythm, right axis deviation.  Diffuse T wave inversion similar to prior EKG.  No STEMI noted.  Assessment/Plan Principal Problem:   Acute on chronic systolic CHF (congestive heart failure) (HCC) Active Problems:   Hypertension   Normochromic normocytic anemia   Type 2 diabetes mellitus with diabetic neuropathy, unspecified (HCC)   Hyperlipidemia LDL goal <70   Acute renal failure  superimposed on stage 3 chronic kidney disease (HCC)   Elevated troponin   Hyponatremia   CHF exacerbation (HCC)    Acute on chronic systolic CHF: -Patient presented with with worsening shortness of breath, leg edema, weight gain of 6 pounds, orthopnea, PND.  Chest x-ray shows mild cardiomegaly and interstitial edema with trace right pleural effusion.  BNP elevated at 1420.  Troponin: 125. -COVID-19 pending.  Magnesium: WNL.  Patient is afebrile with no leukocytosis. -Admit patient on the floor for close monitoring.  On continuous pulse ox.  On telemetry. -She takes Lasix 80 mg BID at home.will cont. Same dose here -Strict INO's and daily weight.  Monitor electrolytes.  Reviewed echo from 1/21 which showed ejection fraction of 40 to 45%. -Continue her home meds-aspirin, statin, Coreg -We will trend troponin.  Monitor kidney function closely. -Monitor vitals closely.  AKI on CKD stage IIIb: -Likely secondary to cardiorenal syndrome & dehydration secondary to diarrhea -Creatinine: 2.56, GFR: 19 (baseline creatinine: 1.77, GFR: 30) -We will get renal ultrasound.  Avoid nephrotoxic medication.   -Cont. Diuretics & watch kidney function closely  Atypical chest pain/elevated troponin:  -Likely demand ischemia.  EKG: No acute changes. -We will trend troponin. -Nitro PRN  Hyponatremia/hypochloremia: Likely secondary to Lasix -Monitor electrolytes.  Chronic hypoxemic respiratory failure: Secondary to underlying systolic CHF  -on 4 L of oxygen at home. -Continue same -  No wheezing noted on exam.  Chest x-ray is negative for infection  Hypertension: Stable -Continue Coreg  Insulin-dependent diabetes mellitus: Check A1c -Continue Lantus 15 units at bedtime.  Started patient on sliding scale insulin.  Hyperlipidemia: Continue statin  Diabetic neuropathy: Continue gabapentin and Cymbalta  GERD: Continue PPI  Anemia of chronic disease: -H&H: 8.4/28.1.  No signs of active  bleeding. -Monitor H&H closely.  Transfuse as needed.  Decreased urinary output: -Will do bladder scan-in and out cath if there is  urinary retention  Diarrhea: Improving -1 episode since this am-Non bloody -Lopermide PRN  Gout: Cont. Allopurinol   DVT prophylaxis: Heparin subcu, SCD Code Status: Full code-confirmed with the patient Family Communication: None present at bedside.  Plan of care discussed with patient in length and she verbalized understanding and agreed with it. Disposition Plan: Likely home in 2 days Consults called: None Admission status: Inpatient   Mckinley Jewel MD [facility]  If 7PM-7AM, please contact night-coverage [email or web address] Password [facility]  09/26/2019, 3:19 PM

## 2019-09-26 NOTE — ED Triage Notes (Signed)
Per EMS- pt here from home with shortness of breath, edema to lower legs, distended abdomen. A&OX4. Dec lung sounds to lower.  Pt endorses pain to legs and abdomen but denies chest pain. 6lb weight gain. Pt has not been taking her morning meds dt "giving her morning diarrhea." takes 80mg  of lasix.

## 2019-09-26 NOTE — Progress Notes (Signed)
Pt arrived to unit. Pt is alert and oriented. Telemetry in place, CCMD called. Vitals and weight in flowsheets.pt in 4L [place]. Lab in room.

## 2019-09-27 ENCOUNTER — Inpatient Hospital Stay (HOSPITAL_COMMUNITY): Payer: Medicare Other

## 2019-09-27 ENCOUNTER — Encounter: Payer: Self-pay | Admitting: Family Medicine

## 2019-09-27 DIAGNOSIS — R778 Other specified abnormalities of plasma proteins: Secondary | ICD-10-CM

## 2019-09-27 DIAGNOSIS — I509 Heart failure, unspecified: Secondary | ICD-10-CM

## 2019-09-27 DIAGNOSIS — I1310 Hypertensive heart and chronic kidney disease without heart failure, with stage 1 through stage 4 chronic kidney disease, or unspecified chronic kidney disease: Secondary | ICD-10-CM

## 2019-09-27 DIAGNOSIS — I5023 Acute on chronic systolic (congestive) heart failure: Secondary | ICD-10-CM

## 2019-09-27 DIAGNOSIS — N179 Acute kidney failure, unspecified: Secondary | ICD-10-CM

## 2019-09-27 DIAGNOSIS — I130 Hypertensive heart and chronic kidney disease with heart failure and stage 1 through stage 4 chronic kidney disease, or unspecified chronic kidney disease: Principal | ICD-10-CM

## 2019-09-27 DIAGNOSIS — E1140 Type 2 diabetes mellitus with diabetic neuropathy, unspecified: Secondary | ICD-10-CM

## 2019-09-27 LAB — GLUCOSE, CAPILLARY
Glucose-Capillary: 145 mg/dL — ABNORMAL HIGH (ref 70–99)
Glucose-Capillary: 133 mg/dL — ABNORMAL HIGH (ref 70–99)
Glucose-Capillary: 122 mg/dL — ABNORMAL HIGH (ref 70–99)
Glucose-Capillary: 135 mg/dL — ABNORMAL HIGH (ref 70–99)
Glucose-Capillary: 101 mg/dL — ABNORMAL HIGH (ref 70–99)

## 2019-09-27 LAB — BASIC METABOLIC PANEL
Anion gap: 11 (ref 5–15)
BUN: 62 mg/dL — ABNORMAL HIGH (ref 8–23)
CO2: 24 mmol/L (ref 22–32)
Calcium: 8.7 mg/dL — ABNORMAL LOW (ref 8.9–10.3)
Chloride: 100 mmol/L (ref 98–111)
Creatinine, Ser: 2.59 mg/dL — ABNORMAL HIGH (ref 0.44–1.00)
GFR calc Af Amer: 22 mL/min — ABNORMAL LOW (ref >60)
GFR calc non Af Amer: 19 mL/min — ABNORMAL LOW (ref >60)
Glucose, Bld: 154 mg/dL — ABNORMAL HIGH (ref 70–99)
Potassium: 3.4 mmol/L — ABNORMAL LOW (ref 3.5–5.1)
Sodium: 135 mmol/L (ref 135–145)

## 2019-09-27 LAB — ECHOCARDIOGRAM LIMITED
Height: 60 in
Weight: 2472.16 oz

## 2019-09-27 MED ORDER — FUROSEMIDE 10 MG/ML IJ SOLN
80.0000 mg | Freq: Three times a day (TID) | INTRAMUSCULAR | Status: DC
Start: 2019-09-27 — End: 2019-09-27

## 2019-09-27 MED ORDER — POTASSIUM CHLORIDE CRYS ER 20 MEQ PO TBCR
40.0000 meq | EXTENDED_RELEASE_TABLET | Freq: Once | ORAL | Status: AC
  Administered 2019-09-27: 40 meq via ORAL
  Filled 2019-09-27: qty 2

## 2019-09-27 MED ORDER — ALLOPURINOL 100 MG PO TABS
200.0000 mg | ORAL_TABLET | Freq: Every day | ORAL | Status: DC
  Administered 2019-09-27 – 2019-10-06 (×10): 200 mg via ORAL
  Filled 2019-09-27 (×10): qty 2

## 2019-09-27 MED ORDER — FUROSEMIDE 10 MG/ML IJ SOLN: 80.0000 mg | Freq: Two times a day (BID) | INTRAMUSCULAR | Status: DC

## 2019-09-27 MED ORDER — FUROSEMIDE 10 MG/ML IJ SOLN: 80.0000 mg | Freq: Three times a day (TID) | INTRAMUSCULAR | Status: DC

## 2019-09-27 MED ORDER — FUROSEMIDE 10 MG/ML IJ SOLN
80.0000 mg | Freq: Three times a day (TID) | INTRAMUSCULAR | Status: DC
  Administered 2019-09-27 – 2019-09-28 (×3): 80 mg via INTRAVENOUS
  Filled 2019-09-27 (×3): qty 8

## 2019-09-27 NOTE — Progress Notes (Signed)
PROGRESS NOTE    Kara Hanson  SAY:[id number] DOB: 1955-06-17 DOA: 09/26/2019 PCP: [name], MD    Chief Complaint  Patient presents with  . Shortness of Breath  . Leg Swelling    Brief Narrative:  Kara Hanson is a 64 y.o. female with medical history significant of hypertension, hyperlipidemia, insulin-dependent diabetes mellitus, systolic congestive heart failure with ejection fraction of 40 to 45%, morbid obesity, CKD stage IIIb, chronic hypoxemic respiratory failure-on 4 L of oxygen via nasal cannula at home presented to emergency department due to worsening shortness of breath, leg edema since 1 week.  She also reported orthopnea, PND, weight gain of 6 pounds.  Reports midsternal chest pain which is constant, 8 out of 10, nonradiating, no aggravating or relieving factors.  She tells me that she has been compliant with her medication-including Lasix 80 mg BID however as per EMS caregiver-she has not been taking her lasix since 1 week?Marland Kitchen  She also reported urine output has been low since 2 days.  No dysuria, hematuria or foul-smelling urine.  Reports watery diarrhea since 2 days which is improving.  No melena, nausea, vomiting, fever, chills, abdominal pain or decreased appetite.  She is followed by cardiology Dr. Lorel Monaco seen in April 2021. denies headache, lightheadedness, dizziness, cough, congestion, generalized weakness.  She lives with her daughter at home.  No history of smoking, alcohol, street drug use.  ED Course: Upon arrival to ED: Patient's vital signs stable.  She is afebrile with no leukocytosis.  CBC shows anemia of chronic disease.  CMP shows worsening kidney function.  BNP elevated at 1420, troponin: 125, EKG shows sinus rhythm, right axis deviation, diffuse T wave inversion similar to previous EKG.  No ST elevation or depression noted.  COVID-19 negative, sodium 131, chloride: 97, chest x-ray shows mild cardiomegaly and interstitial edema, trace right  pleural effusion. Admitted for acute on chronic systolic CHF exacerbation.   Assessment & Plan:   Principal Problem:   Acute on chronic systolic CHF (congestive heart failure) (HCC) Active Problems:   Hypertension   Normochromic normocytic anemia   Type 2 diabetes mellitus with diabetic neuropathy, unspecified (HCC)   Hyperlipidemia LDL goal <70   Acute renal failure superimposed on stage 3 chronic kidney disease (HCC)   Elevated troponin   Hyponatremia   CHF exacerbation (HCC)   Cardiorenal syndrome   Acute on chronic systolic CHF: -Patient presented with with worsening shortness of breath, leg edema, weight gain of 6 pounds, orthopnea, PND.  Chest x-ray showed mild cardiomegaly and interstitial edema with trace right pleural effusion.  BNP elevated at 1420.  Troponin: 125. -COVID-19 negative.  Magnesium: WNL.  Patient is afebrile with no leukocytosis. -On continuous pulse ox.  On telemetry. -She takes Lasix 80 mg BID at home. Continue. -Strict INO's and daily weight.  Monitor electrolytes.  Reviewed echo from 1/21 which showed ejection fraction of 40 to 45%. -Continue her home meds-aspirin, statin, Coreg -Trend troponin.  Monitor kidney function closely. -High suspicion for cardiorenal syndrome.  Consulted cardiology.  AKI on CKD stage IIIb: -Likely secondary to cardiorenal syndrome & dehydration secondary to diarrhea -Creatinine: 2.56, GFR: 19 (baseline creatinine: 1.77, GFR: 30) -Renal ultrasound did not show any acute findings.  Avoid nephrotoxic medication.   -Cont. Diuretics & watch kidney function closely -Continue to monitor urine output.  Consulted nephrology.  Atypical chest pain/elevated troponin:  -Likely demand ischemia.  EKG: No acute changes. -Trend troponin. -Nitro PRN -Consulted cardiology.  Hyponatremia/hypochloremia: Likely secondary to Lasix -Monitor  electrolytes.  Chronic hypoxemic respiratory failure: Secondary to underlying systolic CHF  -on  4 L of oxygen at home. -Continue same -  No wheezing noted on exam.  Chest x-ray is negative for infection  Hypertension: Stable -Continue Coreg -Continue to monitor blood pressure closely and adjust medications as needed.  Insulin-dependent diabetes mellitus: HbA1c 7.1. -Continue Lantus 15 units at bedtime.  Continue to monitor Accu-Cheks, insulin sliding scale.  Adjust dose accordingly.  Hyperlipidemia: Continue statin  Diabetic neuropathy: Continue gabapentin and Cymbalta  GERD: Continue PPI  Anemia of chronic disease: -H&H: 8.4/28.1.  No signs of active bleeding. -Monitor H&H closely.  Transfuse as needed.  Decreased urinary output: -Continue strict intake and output.  Bladder scan if needed for urinary retention  Diarrhea: Improving - denies having any diarrhea or abdominal pain at this time -Lopermide PRN  Gout: Cont. Allopurinol.  Renally dosed.   DVT prophylaxis: Heparin subcutaneous Code Status: Full code Family Communication: No family at bedside Disposition:  Home?  Status is: Inpatient  Remains inpatient appropriate because:Inpatient level of care appropriate due to severity of illness   Dispo: The patient is from: Home              Anticipated d/c is to: Home              Anticipated d/c date is: 3 days              Patient currently is not medically stable to d/c.       Consultants:   Cardiology  Nephrology  Procedures:  None   Antimicrobials:  None    Subjective: She is complaining of shortness of breath.  Diarrhea improving.  Denies chest pain.  Says she feels very tired.  Objective: Vitals:   09/27/19 0425 09/27/19 0737 09/27/19 0919 09/27/19 1211  BP: (!) 112/48 119/67 133/65 (!) 120/58  Pulse: (!) 59 (!) 59 62 (!) 57  Resp: 18 16  16   Temp: 98.1 F (36.7 C) 98.3 F (36.8 C)  (!) 97.3 F (36.3 C)  TempSrc: Oral Oral  Oral  SpO2: 97% 100%  100%  Weight: 70.1 kg     Height:        Intake/Output Summary (Last  24 hours) at 09/27/2019 1533 Last data filed at 09/27/2019 1254 Gross per 24 hour  Intake 360 ml  Output 250 ml  Net 110 ml   Filed Weights   09/26/19 1833 09/27/19 0425  Weight: 70.1 kg 70.1 kg    Examination:  General exam: Ill-appearing female, appears sleepy but arousable and awake Respiratory system: Decreased breath sound lower lobes, crackles, no rhonchi or wheezing Cardiovascular system: S1 & S2 heard, RRR.   Gastrointestinal system: Abdomen distended, nontender, positive bowel sounds  Central nervous system: She is sleepy but easily arousable, generalized weakness without focal deficits Extremities: Lower extremity edema Skin: No rashes, lesions or ulcers Psychiatry: Judgement and insight appear normal. Mood & affect appropriate.     Data Reviewed: I have personally reviewed following labs and imaging studies  CBC: Recent Labs  Lab 09/26/19 1427 09/26/19 1857  WBC 6.5 6.2  NEUTROABS 4.7  --   HGB 8.4* 8.8*  HCT 28.1* 30.0*  MCV 90.4 90.6  PLT 288 102    Basic Metabolic Panel: Recent Labs  Lab 09/26/19 1334 09/26/19 1857 09/27/19 0612  NA 131*  --  135  K 3.8  --  3.4*  CL 97*  --  100  CO2 24  --  24  GLUCOSE 219*  --  154*  BUN 58*  --  62*  CREATININE 2.56* 2.47* 2.59*  CALCIUM 8.6*  --  8.7*  MG 2.1  --   --     GFR: Estimated Creatinine Clearance: 19.2 mL/min (A) (by C-G formula based on SCr of 2.59 mg/dL (H)).  Liver Function Tests: Recent Labs  Lab 09/26/19 1334  AST 20  ALT 14  ALKPHOS 93  BILITOT 0.5  PROT 5.6*  ALBUMIN 2.8*    CBG: Recent Labs  Lab 09/26/19 1642 09/26/19 2111 09/27/19 0616 09/27/19 0830 09/27/19 1212  GLUCAP 143* 119* 145* 133* 122*     Recent Results (from the past 240 hour(s))  SARS Coronavirus 2 by RT PCR (hospital order, performed in [facility] hospital lab) Nasopharyngeal Nasopharyngeal Swab     Status: None   Collection Time: 09/26/19  4:22 PM   Specimen: Nasopharyngeal Swab  Result Value  Ref Range Status   SARS Coronavirus 2 NEGATIVE NEGATIVE Final    Comment: (NOTE) SARS-CoV-2 target nucleic acids are NOT DETECTED.  The SARS-CoV-2 RNA is generally detectable in upper and lower respiratory specimens during the acute phase of infection. The lowest concentration of SARS-CoV-2 viral copies this assay can detect is 250 copies / mL. A negative result does not preclude SARS-CoV-2 infection and should not be used as the sole basis for treatment or other patient management decisions.  A negative result may occur with improper specimen collection / handling, submission of specimen other than nasopharyngeal swab, presence of viral mutation(s) within the areas targeted by this assay, and inadequate number of viral copies (<250 copies / mL). A negative result must be combined with clinical observations, patient history, and epidemiological information.  Fact Sheet for Patients:   StrictlyIdeas.no  Fact Sheet for Healthcare Providers: BankingDealers.co.za  This test is not yet approved or  cleared by the [place] FDA and has been authorized for detection and/or diagnosis of SARS-CoV-2 by FDA under an Emergency Use Authorization (EUA).  This EUA will remain in effect (meaning this test can be used) for the duration of the COVID-19 declaration under Section 564(b)(1) of the Act, 21 U.S.C. section 360bbb-3(b)(1), unless the authorization is terminated or revoked sooner.  Performed at [facility], [place] [address] [place], [place] [address]          Radiology Studies: US RENAL  Result Date: 09/26/2019 CLINICAL DATA:  Worsening renal function. EXAM: RENAL / URINARY TRACT ULTRASOUND COMPLETE COMPARISON:  Renal ultrasound dated February 16, 2019. FINDINGS: Right Kidney: Renal measurements: 10.4 x 4.2 x 4.3 cm = volume: 97 mL . Echogenicity within normal limits. No mass or hydronephrosis visualized. Left Kidney: Renal  measurements: 11.2 x 5.0 x 4.2 cm = volume: 121 mL. Echogenicity within normal limits. No mass or hydronephrosis visualized. Bladder: Appears normal for degree of bladder distention. Other: None. IMPRESSION: Normal renal ultrasound. Electronically Signed   By: Titus Dubin M.D.   On: 09/26/2019 17:44   DG Chest Portable 1 View  Result Date: 09/26/2019 CLINICAL DATA:  Chest pain shortness of breath and leg EXAM: PORTABLE CHEST 1 VIEW COMPARISON:  July 01, 2019 FINDINGS: There is unchanged cardiomegaly. There is diffusely increased interstitial markings seen throughout lungs. Trace blunting of the right costophrenic angle likely trace effusion. Shallow degree aeration with subsegmental atelectasis. No acute osseous abnormality. IMPRESSION: Mild cardiomegaly and interstitial edema. Trace right pleural effusion. Electronically Signed   By: Prudencio Pair M.D.   On: 09/26/2019 14:16  Scheduled Meds: . allopurinol  200 mg Oral QHS  . aspirin EC  81 mg Oral q AM  . atorvastatin  80 mg Oral QHS  . carvedilol  25 mg Oral BID WC  . DULoxetine  30 mg Oral QHS  . furosemide  80 mg Intravenous TID  . gabapentin  100 mg Oral QHS  . heparin  5,000 Units Subcutaneous Q8H  . insulin aspart  0-5 Units Subcutaneous QHS  . insulin aspart  0-9 Units Subcutaneous TID WC  . insulin glargine  15 Units Subcutaneous QHS  . pantoprazole  20 mg Oral Daily  . potassium chloride  40 mEq Oral Once  . primidone  50 mg Oral QHS  . sodium chloride flush  3 mL Intravenous Q12H   Continuous Infusions: . sodium chloride       LOS: 1 day     Yaakov Guthrie, MD [facility] Pager on amion  To contact the attending provider between 7A-7P or the covering provider during after hours 7P-7A, please log into the web site [email or web address] and access using universal [place] password for that web site. If you do not have the password, please call the hospital operator.  09/27/2019, 3:33 PM

## 2019-09-27 NOTE — Evaluation (Signed)
Physical Therapy Evaluation Patient Details Name: Christabelle Hanzlik MRN: [id number] DOB: 1956/01/21 Today's Date: 09/27/2019   History of Present Illness  64 y.o. female with medical history significant of hypertension, hyperlipidemia, insulin-dependent diabetes mellitus, systolic congestive heart failure with ejection fraction of 40 to 45%, morbid obesity, CKD stage IIIb, chronic hypoxemic respiratory failure-on 4 L of oxygen via nasal cannulae at home presents to emergency department due to worsening shortness of breath, leg edema since 1 week. Admitted 6/24 Chest x-ray shows mild cardiomegaly and interstitial edema, trace right pleural effusion.Admitted 6/24 for tratment of acute on chronic CHF  Clinical Impression  PTA pt living with daughter and grandchildren in single level home with level entry. Pt reports independence in household ambulation but requires a scooter to get around the grocery store. Pt reports independence in ADLs and iADLs. This morning pt is limited in safe mobility by lethargy possibly from sleep medication as well as increased oxygen demand with ambulation (see General Comments) in the presence of decreased strength, balance and endurance. Pt reports she currently is being seen by HHPT and PT recommends continuation of HHPT. Pt reports she was only being seen once a week. Pt would benefit from increased visits upon discharge from hospital. PT will continue to follow acutely.      Follow Up Recommendations Home health PT;Supervision/Assistance - 24 hour    Equipment Recommendations  None recommended by PT (already has RW)    Recommendations for Other Services       Precautions / Restrictions Precautions Precautions: None Restrictions Weight Bearing Restrictions: No      Mobility  Bed Mobility Overal bed mobility: Needs Assistance Bed Mobility: Supine to Sit     Supine to sit: Min guard     General bed mobility comments: min guard for safety, increased effort  with HoB elevated and heavy use of bedrail.   Transfers Overall transfer level: Needs assistance Equipment used: Rolling walker (2 wheeled) Transfers: Sit to/from Stand Sit to Stand: Min assist         General transfer comment: light min physical assist to come to fully upright. Slight dizziness on standing which pt reports is her normal 1st thing in the morning.  Ambulation/Gait Ambulation/Gait assistance: Min assist Gait Distance (Feet): 80 Feet Assistive device: Rolling walker (2 wheeled) Gait Pattern/deviations: Step-through pattern;Decreased step length - right;Decreased step length - left;Shuffle;Trunk flexed Gait velocity: slowed Gait velocity interpretation: <1.31 ft/sec, indicative of household ambulator General Gait Details: min A for steadying with ambulation, pt with 4x R knee buckling requiring heavy min A for righting, pt requires standing rest break and increase in supplemental O2 to 6L due to 4/4 DoE        Balance Overall balance assessment: Needs assistance Sitting-balance support: No upper extremity supported;Single extremity supported Sitting balance-Leahy Scale: Fair     Standing balance support: Bilateral upper extremity supported Standing balance-Leahy Scale: Poor Standing balance comment: requires support from RW to steady in static standing                             Pertinent Vitals/Pain Pain Assessment: No/denies pain    Home Living Family/patient expects to be discharged to:: Private residence Living Arrangements: Children Available Help at Discharge: Family;Available 24 hours/day Type of Home: House Home Access: Level entry     Home Layout: One level Home Equipment: Walker - 2 wheels      Prior Function Level of Independence: Needs assistance  Gait / Transfers Assistance Needed: uses RW at home needs a scooter at the store  ADL's / Homemaking Assistance Needed: independent in ADL/iADLs        Hand Dominance    Dominant Hand: Right    Extremity/Trunk Assessment   Upper Extremity Assessment Upper Extremity Assessment: Defer to OT evaluation    Lower Extremity Assessment Lower Extremity Assessment: Generalized weakness       Communication   Communication: No difficulties  Cognition Arousal/Alertness: Lethargic;Suspect due to medications Behavior During Therapy: Flat affect Overall Cognitive Status: Within Functional Limits for tasks assessed                                        General Comments General comments (skin integrity, edema, etc.): Pt reports being given sleeping medication last night and is very drowsy this morning, pt reports it is the cause of her loss of balance in the hallway. Pt on 4L O2 via [place] with SaO2 >90% O2 at rest, with ambulation SaO2 dropped to mid 80%s, instructed in purse lip breathing and supplemental O2 increased to 6L for ambulation back to room         Assessment/Plan    PT Assessment Patient needs continued PT services  PT Problem List Decreased strength;Decreased activity tolerance;Decreased balance;Decreased mobility;Cardiopulmonary status limiting activity       PT Treatment Interventions DME instruction;Gait training;Functional mobility training;Therapeutic activities;Therapeutic exercise;Balance training;Cognitive remediation;Patient/family education    PT Goals (Current goals can be found in the Care Plan section)  Acute Rehab PT Goals Patient Stated Goal: wake up   PT Goal Formulation: With patient Time For Goal Achievement: 10/11/19 Potential to Achieve Goals: Fair    Frequency Min 3X/week    AM-PAC PT "6 Clicks" Mobility  Outcome Measure Help needed turning from your back to your side while in a flat bed without using bedrails?: None Help needed moving from lying on your back to sitting on the side of a flat bed without using bedrails?: A Little Help needed moving to and from a bed to a chair (including a wheelchair)?:  A Little Help needed standing up from a chair using your arms (e.g., wheelchair or bedside chair)?: A Little Help needed to walk in hospital room?: A Little Help needed climbing 3-5 steps with a railing? : Total 6 Click Score: 17    End of Session Equipment Utilized During Treatment: Gait belt;Oxygen Activity Tolerance: Patient limited by lethargy Patient left: in chair;with call bell/phone within reach;with chair alarm set;Other (comment) (hospital official in room) Nurse Communication: Mobility status PT Visit Diagnosis: Unsteadiness on feet (R26.81);Other abnormalities of gait and mobility (R26.89);Muscle weakness (generalized) (M62.81);Difficulty in walking, not elsewhere classified (R26.2);Dizziness and giddiness (R42)    Time: 2979-8921 PT Time Calculation (min) (ACUTE ONLY): 20 min   Charges:   PT Evaluation $PT Eval Moderate Complexity: 1 Mod          Susane Bey B. Migdalia Dk PT, DPT Acute Rehabilitation Services Pager [phone] Office [phone]   [place] 09/27/2019, 10:58 AM

## 2019-09-27 NOTE — Progress Notes (Signed)
  Echocardiogram 2D Echocardiogram has been performed.  Kara Hanson 09/27/2019, 4:53 PM

## 2019-09-27 NOTE — Consult Note (Signed)
[facility]  HISTORY AND PHYSICAL  Kara Hanson is an 64 y.o. female.    Chief Complaint: shortness of breath  HPI: Pt is a 63F with a PMH sig for HTN, HLD, acute on chronic combined systolic and diastolic CHF, DM II, and CKD who is now seen in consultation at the request of Dr. Barth Kirks for eval and recs re: AKI on CKD in the setting of an acute heart failure exacerbation.  Pt has had SOB x 1 week associated with progressive LE swelling.   She has also had 3 days of diarrhea.  She came to the ED where she was found to have acute on chronic CHF exacerbation and AKI on CKD with Cr up to 2.5, up from baseline of 1.9.  In this setting we are asked to see.   She has seen me once in clinic as a hospital followup visit in 2020 and has been lost to followup since.    Currently reports that she's sleepy.  No f/c, diarrhea since she's been here.  Nausea yesterday.  No dysgeusia, anorexia, itching, jerking.  Pressures have been all over the place here--> lowest 90s/60s.  NO NSAIDs.  PMH: Past Medical History:  Diagnosis Date  . Acute CHF (congestive heart failure) ([place]) 09/24/2018  . Acute on chronic combined systolic and diastolic CHF (congestive heart failure) ([place]) 11/27/2017  . Acute on chronic respiratory failure with hypoxia ([place]) 03/20/2018  . Acute renal failure superimposed on stage 3 chronic kidney disease ([address])   . Acute respiratory distress 11/27/2017  . Acute respiratory failure with hypoxia ([place]) 02/09/2019  . Acute systolic CHF (congestive heart failure) ([place]) 05/19/2019  . AKI (acute kidney injury) ([place]) 11/27/2017  . Atelectasis   . Bulging lumbar disc 11/14/2017  . CAP (community acquired pneumonia) 10/31/2018  . Chest pain 06/17/2016  . CHF (congestive heart failure) ([place])   . Chronic diastolic CHF (congestive heart failure) ([place]) 07/20/2018  . Chronic respiratory failure ([place])   . CKD (chronic kidney disease), stage III 03/20/2018  . Controlled type 2 diabetes  mellitus with hyperglycemia ([place]) 04/23/2016  . Controlled type 2 diabetes with neuropathy ([place]) 03/20/2018  . COVID-19 virus infection 07/20/2018  . CVA (cerebral vascular accident) ([place])   . Diabetes mellitus without complication ([place])   . Dyspnea   . Elevated troponin 04/18/2019  . Epigastric pain   . Gout 11/14/2017  . Hypercholesteremia   . Hyperlipidemia LDL goal <70 09/30/2016  . Hypertension   . Myocardial infarction ([place])   . Normochromic normocytic anemia 04/23/2016  . NSTEMI (non-ST elevated myocardial infarction) ([place]) 02/09/2019  . Pneumonia 11/01/2018  . Pneumonia due to COVID-19 virus 07/20/2018  . S/P thoracentesis   . SIRS (systemic inflammatory response syndrome) ([place]) 07/25/2018  . Spinal stenosis   . Stress-induced cardiomyopathy 05/19/2016  . Syncope 04/23/2016  . Type 2 diabetes mellitus with diabetic neuropathy, unspecified ([place]) 05/06/2016  . Vertigo 05/06/2016  . Vitamin D deficiency 04/02/2018   PSH: Past Surgical History:  Procedure Laterality Date  . BIOPSY  01/27/2019   Procedure: BIOPSY;  Surgeon: Otis Brace, MD;  Location: [facility];  Service: Gastroenterology;;  . BLADDER SURGERY    . CARDIAC CATHETERIZATION N/A 04/25/2016   Procedure: Left Heart Cath and Coronary Angiography;  Surgeon: Lorretta Harp, MD;  Location: [facility];  Service: Cardiovascular;  Laterality: N/A;  . CARDIAC CATHETERIZATION  02/11/2019  . CESAREAN SECTION    . CHOLECYSTECTOMY    . ESOPHAGOGASTRODUODENOSCOPY (EGD) WITH  PROPOFOL N/A 01/27/2019   Procedure: ESOPHAGOGASTRODUODENOSCOPY (EGD) WITH PROPOFOL;  Surgeon: Otis Brace, MD;  Location: [facility];  Service: Gastroenterology;  Laterality: N/A;  . RIGHT/LEFT HEART CATH AND CORONARY ANGIOGRAPHY N/A 02/11/2019   Procedure: RIGHT/LEFT HEART CATH AND CORONARY ANGIOGRAPHY;  Surgeon: Jolaine Artist, MD;  Location: [facility];  Service: Cardiovascular;  Laterality: N/A;  . VIDEO BRONCHOSCOPY N/A  02/01/2019   Procedure: VIDEO BRONCHOSCOPY WITHOUT FLUORO;  Surgeon: Candee Furbish, MD;  Location: [facility];  Service: Endoscopy;  Laterality: N/A;    Past Medical History:  Diagnosis Date  . Acute CHF (congestive heart failure) ([place]) 09/24/2018  . Acute on chronic combined systolic and diastolic CHF (congestive heart failure) ([place]) 11/27/2017  . Acute on chronic respiratory failure with hypoxia ([place]) 03/20/2018  . Acute renal failure superimposed on stage 3 chronic kidney disease ([place])   . Acute respiratory distress 11/27/2017  . Acute respiratory failure with hypoxia ([place]) 02/09/2019  . Acute systolic CHF (congestive heart failure) ([place]) 05/19/2019  . AKI (acute kidney injury) ([place]) 11/27/2017  . Atelectasis   . Bulging lumbar disc 11/14/2017  . CAP (community acquired pneumonia) 10/31/2018  . Chest pain 06/17/2016  . CHF (congestive heart failure) ([place])   . Chronic diastolic CHF (congestive heart failure) ([place]) 07/20/2018  . Chronic respiratory failure ([place])   . CKD (chronic kidney disease), stage III 03/20/2018  . Controlled type 2 diabetes mellitus with hyperglycemia ([place]) 04/23/2016  . Controlled type 2 diabetes with neuropathy ([place]) 03/20/2018  . COVID-19 virus infection 07/20/2018  . CVA (cerebral vascular accident) ([place])   . Diabetes mellitus without complication ([place])   . Dyspnea   . Elevated troponin 04/18/2019  . Epigastric pain   . Gout 11/14/2017  . Hypercholesteremia   . Hyperlipidemia LDL goal <70 09/30/2016  . Hypertension   . Myocardial infarction ([place])   . Normochromic normocytic anemia 04/23/2016  . NSTEMI (non-ST elevated myocardial infarction) ([place]) 02/09/2019  . Pneumonia 11/01/2018  . Pneumonia due to COVID-19 virus 07/20/2018  . S/P thoracentesis   . SIRS (systemic inflammatory response syndrome) ([place]) 07/25/2018  . Spinal stenosis   . Stress-induced cardiomyopathy 05/19/2016  . Syncope 04/23/2016  . Type 2 diabetes mellitus with diabetic neuropathy, unspecified  ([place]) 05/06/2016  . Vertigo 05/06/2016  . Vitamin D deficiency 04/02/2018    Medications:   Scheduled: . allopurinol  200 mg Oral QHS  . aspirin EC  81 mg Oral q AM  . atorvastatin  80 mg Oral QHS  . carvedilol  25 mg Oral BID WC  . DULoxetine  30 mg Oral QHS  . furosemide  80 mg Intravenous BID  . gabapentin  100 mg Oral QHS  . heparin  5,000 Units Subcutaneous Q8H  . insulin aspart  0-5 Units Subcutaneous QHS  . insulin aspart  0-9 Units Subcutaneous TID WC  . insulin glargine  15 Units Subcutaneous QHS  . pantoprazole  20 mg Oral Daily  . primidone  50 mg Oral QHS  . sodium chloride flush  3 mL Intravenous Q12H    Medications Prior to Admission  Medication Sig Dispense Refill  . acetaminophen (TYLENOL) 500 MG tablet Take 500-1,000 mg by mouth every 6 (six) hours as needed for mild pain or headache.    . allopurinol (ZYLOPRIM) 300 MG tablet Take 300 mg by mouth at bedtime.     Marland Kitchen aspirin EC 81 MG tablet Take 81 mg by mouth in the morning.     Marland Kitchen  atorvastatin (LIPITOR) 80 MG tablet Take 80 mg by mouth at bedtime.     . carvedilol (COREG) 25 MG tablet TAKE 1 TABLET (25 MG TOTAL) BY MOUTH 2 (TWO) TIMES DAILY WITH A MEAL. 60 tablet 3  . DULoxetine (CYMBALTA) 30 MG capsule Take 1 capsule (30 mg total) by mouth daily. (Patient taking differently: Take 30 mg by mouth at bedtime. ) 30 capsule 3  . ergocalciferol (VITAMIN D2) 1.25 MG (50000 UT) capsule Take 50,000 Units by mouth every Sunday.     . furosemide (LASIX) 80 MG tablet TAKE 1 TABLET (80 MG TOTAL) BY MOUTH 2 (TWO) TIMES DAILY. (Patient taking differently: Take 80 mg by mouth in the morning and at bedtime. ) 60 tablet 0  . gabapentin (NEURONTIN) 100 MG capsule Take 1 capsule (100 mg total) by mouth at bedtime. (Patient taking differently: Take 100 mg by mouth in the morning and at bedtime. )    . insulin aspart (NOVOLOG) 100 UNIT/ML injection 0 to 12 units subcutaneously 3 times daily before meals as per sliding scale (Patient taking  differently: Inject 0-12 Units into the skin See admin instructions. Inject 0-12 units into the skin three times a day before meals, per sliding scale) 30 mL 3  . Insulin Glargine (BASAGLAR KWIKPEN) 100 UNIT/ML SOPN Inject 0.15 mLs (15 Units total) into the skin at bedtime. 30 mL 3  . lansoprazole (PREVACID) 15 MG capsule TAKE 1 CAPSULE (15 MG TOTAL) BY MOUTH DAILY. (Patient taking differently: Take 15 mg by mouth daily before breakfast. ) 30 capsule 2  . metoCLOPramide (REGLAN) 5 MG tablet Take 1 tablet (5 mg total) by mouth 3 (three) times daily as needed for nausea. (Patient taking differently: Take 5 mg by mouth in the morning and at bedtime. ) 90 tablet 1  . Multiple Vitamins-Minerals (MULTIVITAMIN WITH MINERALS) tablet Take 1 tablet by mouth daily with breakfast.     . OXYGEN Inhale 4 L/min into the lungs continuous.    . polyethylene glycol (MIRALAX / GLYCOLAX) 17 g packet Take 17 g by mouth daily as needed for moderate constipation. (Patient taking differently: Take 17 g by mouth daily as needed for mild constipation or moderate constipation (MIX AND DRINK). )    . primidone (MYSOLINE) 50 MG tablet Take 1 tablet (50 mg total) by mouth in the morning and at bedtime. 180 tablet 1  . sodium chloride (OCEAN) 0.65 % SOLN nasal spray Place 1 spray into both nostrils as needed for congestion.     . SUPER B COMPLEX/C CAPS Take 1 capsule by mouth daily.     . Accu-Chek FastClix Lancets MISC USE AS DIRECTED TO TEST BLOOD SUGAR THREE TIMES DAILY 102 each 12  . Blood Glucose Monitoring Suppl (ACCU-CHEK GUIDE) w/Device KIT 1 each by Does not apply route 3 (three) times daily. 1 kit 0  . EASY COMFORT PEN NEEDLES 31G X 5 MM MISC USE FOUR TIMES PER DAY FOR INSULIN ADMINISTRATION (Patient taking differently: 4 (four) times daily. ) 200 each 12  . glucose blood (ACCU-CHEK GUIDE) test strip USE AS DIRECTED TO TEST BLOOD SUGAR THREE TIMES DAILY 100 each 12  . Lancet Device MISC Use as instructed 3 times daily 1  each 0  . Misc. Devices MISC Portable oxygen concentrator.  Diagnosis-chronic respiratory failure. 1 each 0  . Misc. Devices MISC Rollaor with seat. Dx: Congestive Heart Failure 1 each 0  . Misc. Devices MISC Scale; Dx - CHF 1 each 0  . Misc.   [facility] bed.  Diagnosis CHF.  Lifetime use.  Weight 165 lbs. 1 each 0    ALLERGIES:   Allergies  Allergen Reactions  . Garlic Shortness Of Breath, Itching, Swelling and Other (See Comments)    "Raw garlic" = Hand itching and swelling  . Isosorbide Other (See Comments)    Sneezing and runny nose  . Latex Itching  . Morphine And Related Itching and Other (See Comments)    Headache   . Other Itching and Other (See Comments)    Reaction to newspaper ink- Headaches, also    FAM HX: Family History  Problem Relation Age of Onset  . Diabetes Mellitus II Father   . Stroke Father   . Healthy Mother        She is 26 years old.     Social History:   reports that she has never smoked. She has never used smokeless tobacco. She reports that she does not drink alcohol and does not use drugs.  ROS: ROS: all other systems reviewed and are negative except as per HPI  Blood pressure (!) 120/58, pulse (!) 57, temperature (!) 97.3 F (36.3 C), temperature source Oral, resp. rate 16, height 5' (1.524 m), weight 70.1 kg, SpO2 100 %. PHYSICAL EXAM: Physical Exam  GEN sleeping, arousable HEENT EOMI PERRL sclerae nonicteric NECK + JVD PULM on O2, normal WOB, bibasilar crackles CV RRR ABD soft some abd distention EXT 1+ LE edema NEURO AAO x 3 no asterixis SKIN no rashes   Results for orders placed or performed during the hospital encounter of 09/26/19 (from the past 48 hour(s))  Troponin I (High Sensitivity)     Status: Abnormal   Collection Time: 09/26/19  1:34 PM  Result Value Ref Range   Troponin I (High Sensitivity) 125 (HH) <18 ng/L    Comment: CRITICAL RESULT CALLED TO, READ BACK BY AND VERIFIED WITH: J VAZQUEZ RN  [id number]  BY A BENNETT (NOTE) Elevated high sensitivity troponin I (hsTnI) values and significant  changes across serial measurements may suggest ACS but many other  chronic and acute conditions are known to elevate hsTnI results.  Refer to the Links section for chest pain algorithms and additional  guidance. Performed at [facility], [place] [address], [place], [place] [address]   Comprehensive metabolic panel     Status: Abnormal   Collection Time: 09/26/19  1:34 PM  Result Value Ref Range   Sodium 131 (L) 135 - 145 mmol/L   Potassium 3.8 3.5 - 5.1 mmol/L   Chloride 97 (L) 98 - 111 mmol/L   CO2 24 22 - 32 mmol/L   Glucose, Bld 219 (H) 70 - 99 mg/dL    Comment: Glucose reference range applies only to samples taken after fasting for at least 8 hours.   BUN 58 (H) 8 - 23 mg/dL   Creatinine, Ser 2.56 (H) 0.44 - 1.00 mg/dL   Calcium 8.6 (L) 8.9 - 10.3 mg/dL   Total Protein 5.6 (L) 6.5 - 8.1 g/dL   Albumin 2.8 (L) 3.5 - 5.0 g/dL   AST 20 15 - 41 U/L   ALT 14 0 - 44 U/L   Alkaline Phosphatase 93 38 - 126 U/L   Total Bilirubin 0.5 0.3 - 1.2 mg/dL   GFR calc non Af Amer 19 (L) >60 mL/min   GFR calc Af Amer 22 (L) >60 mL/min   Anion gap 10 5 - 15    Comment: Performed at [facility], [address] [place], [place] [address]  Magnesium     Status: None   Collection Time: 09/26/19  1:34 PM  Result Value Ref Range   Magnesium 2.1 1.7 - 2.4 mg/dL    Comment: Performed at [facility], [address] [place], [place] [address]  Brain natriuretic peptide     Status: Abnormal   Collection Time: 09/26/19  2:27 PM  Result Value Ref Range   B Natriuretic Peptide 1,420.6 (H) 0.0 - 100.0 pg/mL    Comment: Performed at [facility], [address] [place], [place] [address]  CBC with Differential/Platelet     Status: Abnormal   Collection Time: 09/26/19  2:27 PM  Result Value Ref Range   WBC 6.5 4.0 - 10.5 K/uL   RBC 3.11 (L) 3.87 - 5.11 MIL/uL   Hemoglobin 8.4 (L) 12.0 -  15.0 g/dL   HCT 28.1 (L) 36 - 46 %   MCV 90.4 80.0 - 100.0 fL   MCH 27.0 26.0 - 34.0 pg   MCHC 29.9 (L) 30.0 - 36.0 g/dL   RDW 17.4 (H) 11.5 - 15.5 %   Platelets 288 150 - 400 K/uL   nRBC 1.1 (H) 0.0 - 0.2 %   Neutrophils Relative % 71 %   Neutro Abs 4.7 1.7 - 7.7 K/uL   Lymphocytes Relative 17 %   Lymphs Abs 1.1 0.7 - 4.0 K/uL   Monocytes Relative 9 %   Monocytes Absolute 0.6 0 - 1 K/uL   Eosinophils Relative 1 %   Eosinophils Absolute 0.1 0 - 0 K/uL   Basophils Relative 1 %   Basophils Absolute 0.0 0 - 0 K/uL   Immature Granulocytes 1 %   Abs Immature Granulocytes 0.04 0.00 - 0.07 K/uL    Comment: Performed at [facility], [address] [place], [place] [address]  Troponin I (High Sensitivity)     Status: Abnormal   Collection Time: 09/26/19  4:22 PM  Result Value Ref Range   Troponin I (High Sensitivity) 123 (HH) <18 ng/L    Comment: CRITICAL VALUE NOTED.  VALUE IS CONSISTENT WITH PREVIOUSLY REPORTED AND CALLED VALUE. (NOTE) Elevated high sensitivity troponin I (hsTnI) values and significant  changes across serial measurements may suggest ACS but many other  chronic and acute conditions are known to elevate hsTnI results.  Refer to the Links section for chest pain algorithms and additional  guidance. Performed at [facility], [address] [place], [place] [address]   SARS Coronavirus 2 by RT PCR (hospital order, performed in [place] hospital lab) Nasopharyngeal Nasopharyngeal Swab     Status: None   Collection Time: 09/26/19  4:22 PM   Specimen: Nasopharyngeal Swab  Result Value Ref Range   SARS Coronavirus 2 NEGATIVE NEGATIVE    Comment: (NOTE) SARS-CoV-2 target nucleic acids are NOT DETECTED.  The SARS-CoV-2 RNA is generally detectable in upper and lower respiratory specimens during the acute phase of infection. The lowest concentration of SARS-CoV-2 viral copies this assay can detect is 250 copies / mL. A negative result does not preclude  SARS-CoV-2 infection and should not be used as the sole basis for treatment or other patient management decisions.  A negative result may occur with improper specimen collection / handling, submission of specimen other than nasopharyngeal swab, presence of viral mutation(s) within the areas targeted by this assay, and inadequate number of viral copies (<250 copies / mL). A negative result   must be combined with clinical observations, patient history, and epidemiological information.  Fact Sheet for Patients:   StrictlyIdeas.no  Fact Sheet for Healthcare Providers: BankingDealers.co.za  This test is not yet approved or  cleared by the [place] FDA and has been authorized for detection and/or diagnosis of SARS-CoV-2 by FDA under an Emergency Use Authorization (EUA).  This EUA will remain in effect (meaning this test can be used) for the duration of the COVID-19 declaration under Section 564(b)(1) of the Act, 21 U.S.C. section 360bbb-3(b)(1), unless the authorization is terminated or revoked sooner.  Performed at [facility], [place] [address], [place], [place] [address]   TSH     Status: None   Collection Time: 09/26/19  4:22 PM  Result Value Ref Range   TSH 3.156 0.350 - 4.500 uIU/mL    Comment: Performed by a 3rd Generation assay with a functional sensitivity of <=0.01 uIU/mL. Performed at [facility], [place] [address] [place], [place] [address]   CBG monitoring, ED     Status: Abnormal   Collection Time: 09/26/19  4:42 PM  Result Value Ref Range   Glucose-Capillary 143 (H) 70 - 99 mg/dL    Comment: Glucose reference range applies only to samples taken after fasting for at least 8 hours.  Urinalysis, Routine w reflex microscopic     Status: Abnormal   Collection Time: 09/26/19  4:45 PM  Result Value Ref Range   Color, Urine AMBER (A) YELLOW    Comment: BIOCHEMICALS MAY BE AFFECTED BY COLOR   APPearance CLOUDY (A)  CLEAR   Specific Gravity, Urine 1.014 1.005 - 1.030   pH 5.0 5.0 - 8.0   Glucose, UA 50 (A) NEGATIVE mg/dL   Hgb urine dipstick SMALL (A) NEGATIVE   Bilirubin Urine NEGATIVE NEGATIVE   Ketones, ur NEGATIVE NEGATIVE mg/dL   Protein, ur >=300 (A) NEGATIVE mg/dL   Nitrite NEGATIVE NEGATIVE   Leukocytes,Ua SMALL (A) NEGATIVE   RBC / HPF 0-5 0 - 5 RBC/hpf   WBC, UA 6-10 0 - 5 WBC/hpf   Bacteria, UA MANY (A) NONE SEEN   Squamous Epithelial / LPF 0-5 0 - 5   Mucus PRESENT    Hyaline Casts, UA PRESENT     Comment: Performed at [facility], [address] [place], [place] [address]  Hemoglobin A1c     Status: Abnormal   Collection Time: 09/26/19  6:56 PM  Result Value Ref Range   Hgb A1c MFr Bld 7.1 (H) 4.8 - 5.6 %    Comment: (NOTE) Pre diabetes:          5.7%-6.4%  Diabetes:              >6.4%  Glycemic control for   <7.0% adults with diabetes    Mean Plasma Glucose 157.07 mg/dL    Comment: Performed at [facility] [address], [place], [place] [address]  Troponin I (High Sensitivity)     Status: Abnormal   Collection Time: 09/26/19  6:56 PM  Result Value Ref Range   Troponin I (High Sensitivity) 130 (HH) <18 ng/L    Comment: CRITICAL VALUE NOTED.  VALUE IS CONSISTENT WITH PREVIOUSLY REPORTED AND CALLED VALUE. (NOTE) Elevated high sensitivity troponin I (hsTnI) values and significant  changes across serial measurements may suggest ACS but many other  chronic and acute conditions are known to elevate hsTnI results.  Refer to the Links section for chest pain algorithms and additional  guidance. Performed at [facility], [place] [address] [place], [place] [address]   CBC     Status: Abnormal   Collection Time: 09/26/19  6:57 PM  Result Value Ref Range   WBC 6.2 4.0 - 10.5 K/uL   RBC 3.31 (L) 3.87 - 5.11 MIL/uL   Hemoglobin 8.8 (L) 12.0 - 15.0 g/dL   HCT 30.0 (L) 36 - 46 %   MCV 90.6 80.0 - 100.0 fL   MCH 26.6 26.0 - 34.0 pg   MCHC 29.3 (L) 30.0 -  36.0 g/dL   RDW 17.4 (H) 11.5 - 15.5 %   Platelets 281 150 - 400 K/uL   nRBC 1.0 (H) 0.0 - 0.2 %    Comment: Performed at [facility] [address], [place], [place] [address]  Creatinine, serum     Status: Abnormal   Collection Time: 09/26/19  6:57 PM  Result Value Ref Range   Creatinine, Ser 2.47 (H) 0.44 - 1.00 mg/dL   GFR calc non Af Amer 20 (L) >60 mL/min   GFR calc Af Amer 23 (L) >60 mL/min    Comment: Performed at [facility] [address], [place], [place] [address]  Glucose, capillary     Status: Abnormal   Collection Time: 09/26/19  9:11 PM  Result Value Ref Range   Glucose-Capillary 119 (H) 70 - 99 mg/dL    Comment: Glucose reference range applies only to samples taken after fasting for at least 8 hours.  Basic metabolic panel     Status: Abnormal   Collection Time: 09/27/19  6:12 AM  Result Value Ref Range   Sodium 135 135 - 145 mmol/L   Potassium 3.4 (L) 3.5 - 5.1 mmol/L   Chloride 100 98 - 111 mmol/L   CO2 24 22 - 32 mmol/L   Glucose, Bld 154 (H) 70 - 99 mg/dL    Comment: Glucose reference range applies only to samples taken after fasting for at least 8 hours.   BUN 62 (H) 8 - 23 mg/dL   Creatinine, Ser 2.59 (H) 0.44 - 1.00 mg/dL   Calcium 8.7 (L) 8.9 - 10.3 mg/dL   GFR calc non Af Amer 19 (L) >60 mL/min   GFR calc Af Amer 22 (L) >60 mL/min   Anion gap 11 5 - 15    Comment: Performed at [facility] [address], [place], [place] [address]  Glucose, capillary     Status: Abnormal   Collection Time: 09/27/19  6:16 AM  Result Value Ref Range   Glucose-Capillary 145 (H) 70 - 99 mg/dL    Comment: Glucose reference range applies only to samples taken after fasting for at least 8 hours.  Glucose, capillary     Status: Abnormal   Collection Time: 09/27/19  8:30 AM  Result Value Ref Range   Glucose-Capillary 133 (H) 70 - 99 mg/dL    Comment: Glucose reference range applies only to samples taken after fasting for at least 8 hours.  Glucose,  capillary     Status: Abnormal   Collection Time: 09/27/19 12:12 PM  Result Value Ref Range   Glucose-Capillary 122 (H) 70 - 99 mg/dL    Comment: Glucose reference range applies only to samples taken after fasting for at least 8 hours.    US RENAL  Result Date: 09/26/2019 CLINICAL DATA:  Worsening renal function. EXAM: RENAL / URINARY TRACT ULTRASOUND COMPLETE COMPARISON:  Renal ultrasound dated February 16, 2019. FINDINGS:  Right Kidney: Renal measurements: 10.4 x 4.2 x 4.3 cm = volume: 97 mL . Echogenicity within normal limits. No mass or hydronephrosis visualized. Left Kidney: Renal measurements: 11.2 x 5.0 x 4.2 cm = volume: 121 mL. Echogenicity within normal limits. No mass or hydronephrosis visualized. Bladder: Appears normal for degree of bladder distention. Other: None. IMPRESSION: Normal renal ultrasound. Electronically Signed   By: William T Derry M.D.   On: 09/26/2019 17:44   DG Chest Portable 1 View  Result Date: 09/26/2019 CLINICAL DATA:  Chest pain shortness of breath and leg EXAM: PORTABLE CHEST 1 VIEW COMPARISON:  July 01, 2019 FINDINGS: There is unchanged cardiomegaly. There is diffusely increased interstitial markings seen throughout lungs. Trace blunting of the right costophrenic angle likely trace effusion. Shallow degree aeration with subsegmental atelectasis. No acute osseous abnormality. IMPRESSION: Mild cardiomegaly and interstitial edema. Trace right pleural effusion. Electronically Signed   By: Bindu  Avutu M.D.   On: 09/26/2019 14:16    Assessment/Plan  1.  Acute on chronic systolic CHF exacerbation: cardiology consulted.  Repeat TTE planned.  Trops flat.    2.  AKI on CKD 3b: likely cardiorenal +/- hypotension.  Cr up to 2.5 from baseline of 1.9.  Renal US without obstruction.  Will try to diurese with 80 IV TID of lasix.  Weights don't seem to have decreased from yesterday.  Follow closely    3.  Acute hypoxic RF: on O2, wean as above with vol removal  4.  DM II:  per primary  5.  Dispo: pending     UPTON, ELIZABETH 09/27/2019, 3:00 PM   

## 2019-09-27 NOTE — Telephone Encounter (Signed)
She is currently admitted to hospital   Candee Furbish MD

## 2019-09-27 NOTE — Progress Notes (Signed)
Nurse encouraged pt to ambulate to bathroom today. Only 250 ml output today.  Nurse will continue to encourage pt to use BSC/purewick or ambulate to bathroom.  Pt verbalizes understanding of correct output measurements and reason for encouragement.  Bladder scan 8 ml. MD aware.

## 2019-09-27 NOTE — Consult Note (Signed)
CONSULTATION NOTE   Patient Name: Kara Hanson Date of Encounter: 09/27/2019 Cardiologist: Candee Furbish, MD  Chief Complaint   Consult for CHF, dyspnea  Patient Profile   64 yo female with HTN, HLD, IDDM, systolic CHF with LVEF 76-73%, morbid obesity, CKD 3b, chronic hypoxic respiratory failure, presented with shortness of breath and worsening edema consistent with acute on chronic systolic congestive heart failure.  HPI   Kara Hanson is a 64 y.o. female who is being seen today for the evaluation of CHF at the request of Dr. Barth Kirks. 64 yo female with HTN, HLD, IDDM, systolic CHF with LVEF 41-93%, morbid obesity, CKD 3b, chronic hypoxic respiratory failure, presented with shortness of breath and worsening edema consistent with acute on chronic systolic congestive heart failure. This is a patient of Dr. Marlou Porch who was recently seen in the office in 07/2019. She has a NICM with normal coronaries by cath in the past. She is notably not able to take Entresto or ARB due to renal dysfunction. According to the patient she has been compliant with her lasix, however, her caregiver states that may not be the case. BNP on arrival was 1420, creatinine increased from baseline now significantly at 2.59 (GFR 22), CXR shows cardiomegaly and interstitial edema with trace right pleural effusion. She was started on lasix 80 mg IV BID and has not had significant recorded diuresis. Weight is stable at 70 kg. Office weight this past April was 68.5 kg. Troponin this admit was 123/130, more consistent with demand ischemia/CHF.   PMHx   Past Medical History:  Diagnosis Date  . Acute CHF (congestive heart failure) ([place]) 09/24/2018  . Acute on chronic combined systolic and diastolic CHF (congestive heart failure) ([place]) 11/27/2017  . Acute on chronic respiratory failure with hypoxia ([place]) 03/20/2018  . Acute renal failure superimposed on stage 3 chronic kidney disease ([place])   . Acute respiratory distress  11/27/2017  . Acute respiratory failure with hypoxia ([place]) 02/09/2019  . Acute systolic CHF (congestive heart failure) ([place]) 05/19/2019  . AKI (acute kidney injury) ([place]) 11/27/2017  . Atelectasis   . Bulging lumbar disc 11/14/2017  . CAP (community acquired pneumonia) 10/31/2018  . Chest pain 06/17/2016  . CHF (congestive heart failure) ([place])   . Chronic diastolic CHF (congestive heart failure) ([place]) 07/20/2018  . Chronic respiratory failure ([place])   . CKD (chronic kidney disease), stage III 03/20/2018  . Controlled type 2 diabetes mellitus with hyperglycemia ([place]) 04/23/2016  . Controlled type 2 diabetes with neuropathy ([place]) 03/20/2018  . COVID-19 virus infection 07/20/2018  . CVA (cerebral vascular accident) ([place])   . Diabetes mellitus without complication ([place])   . Dyspnea   . Elevated troponin 04/18/2019  . Epigastric pain   . Gout 11/14/2017  . Hypercholesteremia   . Hyperlipidemia LDL goal <70 09/30/2016  . Hypertension   . Myocardial infarction ([place])   . Normochromic normocytic anemia 04/23/2016  . NSTEMI (non-ST elevated myocardial infarction) ([place]) 02/09/2019  . Pneumonia 11/01/2018  . Pneumonia due to COVID-19 virus 07/20/2018  . S/P thoracentesis   . SIRS (systemic inflammatory response syndrome) ([place]) 07/25/2018  . Spinal stenosis   . Stress-induced cardiomyopathy 05/19/2016  . Syncope 04/23/2016  . Type 2 diabetes mellitus with diabetic neuropathy, unspecified ([place]) 05/06/2016  . Vertigo 05/06/2016  . Vitamin D deficiency 04/02/2018    Past Surgical History:  Procedure Laterality Date  . BIOPSY  01/27/2019   Procedure: BIOPSY;  Surgeon: Otis Brace, MD;  Location: [facility];  Service:  Gastroenterology;;  . BLADDER SURGERY    . CARDIAC CATHETERIZATION N/A 04/25/2016   Procedure: Left Heart Cath and Coronary Angiography;  Surgeon: Lorretta Harp, MD;  Location: [facility];  Service: Cardiovascular;  Laterality: N/A;  . CARDIAC CATHETERIZATION  02/11/2019  .  CESAREAN SECTION    . CHOLECYSTECTOMY    . ESOPHAGOGASTRODUODENOSCOPY (EGD) WITH PROPOFOL N/A 01/27/2019   Procedure: ESOPHAGOGASTRODUODENOSCOPY (EGD) WITH PROPOFOL;  Surgeon: Otis Brace, MD;  Location: [facility];  Service: Gastroenterology;  Laterality: N/A;  . RIGHT/LEFT HEART CATH AND CORONARY ANGIOGRAPHY N/A 02/11/2019   Procedure: RIGHT/LEFT HEART CATH AND CORONARY ANGIOGRAPHY;  Surgeon: Jolaine Artist, MD;  Location: [facility];  Service: Cardiovascular;  Laterality: N/A;  . VIDEO BRONCHOSCOPY N/A 02/01/2019   Procedure: VIDEO BRONCHOSCOPY WITHOUT FLUORO;  Surgeon: Candee Furbish, MD;  Location: [facility];  Service: Endoscopy;  Laterality: N/A;    FAMHx   Family History  Problem Relation Age of Onset  . Diabetes Mellitus II Father   . Stroke Father   . Healthy Mother        She is 63 years old.     SOCHx    reports that she has never smoked. She has never used smokeless tobacco. She reports that she does not drink alcohol and does not use drugs.  Outpatient Medications   No current facility-administered medications on file prior to encounter.   Current Outpatient Medications on File Prior to Encounter  Medication Sig Dispense Refill  . acetaminophen (TYLENOL) 500 MG tablet Take 500-1,000 mg by mouth every 6 (six) hours as needed for mild pain or headache.    . allopurinol (ZYLOPRIM) 300 MG tablet Take 300 mg by mouth at bedtime.     Marland Kitchen aspirin EC 81 MG tablet Take 81 mg by mouth in the morning.     Marland Kitchen atorvastatin (LIPITOR) 80 MG tablet Take 80 mg by mouth at bedtime.     . carvedilol (COREG) 25 MG tablet TAKE 1 TABLET (25 MG TOTAL) BY MOUTH 2 (TWO) TIMES DAILY WITH A MEAL. 60 tablet 3  . DULoxetine (CYMBALTA) 30 MG capsule Take 1 capsule (30 mg total) by mouth daily. (Patient taking differently: Take 30 mg by mouth at bedtime. ) 30 capsule 3  . ergocalciferol (VITAMIN D2) 1.25 MG (50000 UT) capsule Take 50,000 Units by mouth every Sunday.     .  furosemide (LASIX) 80 MG tablet TAKE 1 TABLET (80 MG TOTAL) BY MOUTH 2 (TWO) TIMES DAILY. (Patient taking differently: Take 80 mg by mouth in the morning and at bedtime. ) 60 tablet 0  . gabapentin (NEURONTIN) 100 MG capsule Take 1 capsule (100 mg total) by mouth at bedtime. (Patient taking differently: Take 100 mg by mouth in the morning and at bedtime. )    . insulin aspart (NOVOLOG) 100 UNIT/ML injection 0 to 12 units subcutaneously 3 times daily before meals as per sliding scale (Patient taking differently: Inject 0-12 Units into the skin See admin instructions. Inject 0-12 units into the skin three times a day before meals, per sliding scale) 30 mL 3  . Insulin Glargine (BASAGLAR KWIKPEN) 100 UNIT/ML SOPN Inject 0.15 mLs (15 Units total) into the skin at bedtime. 30 mL 3  . lansoprazole (PREVACID) 15 MG capsule TAKE 1 CAPSULE (15 MG TOTAL) BY MOUTH DAILY. (Patient taking differently: Take 15 mg by mouth daily before breakfast. ) 30 capsule 2  . metoCLOPramide (REGLAN) 5 MG tablet Take 1 tablet (5 mg  total) by mouth 3 (three) times daily as needed for nausea. (Patient taking differently: Take 5 mg by mouth in the morning and at bedtime. ) 90 tablet 1  . Multiple Vitamins-Minerals (MULTIVITAMIN WITH MINERALS) tablet Take 1 tablet by mouth daily with breakfast.     . OXYGEN Inhale 4 L/min into the lungs continuous.    . polyethylene glycol (MIRALAX / GLYCOLAX) 17 g packet Take 17 g by mouth daily as needed for moderate constipation. (Patient taking differently: Take 17 g by mouth daily as needed for mild constipation or moderate constipation (MIX AND DRINK). )    . primidone (MYSOLINE) 50 MG tablet Take 1 tablet (50 mg total) by mouth in the morning and at bedtime. 180 tablet 1  . sodium chloride (OCEAN) 0.65 % SOLN nasal spray Place 1 spray into both nostrils as needed for congestion.     . SUPER B COMPLEX/C CAPS Take 1 capsule by mouth daily.     . Accu-Chek FastClix Lancets MISC USE AS DIRECTED TO  TEST BLOOD SUGAR THREE TIMES DAILY 102 each 12  . Blood Glucose Monitoring Suppl (ACCU-CHEK GUIDE) w/Device KIT 1 each by Does not apply route 3 (three) times daily. 1 kit 0  . EASY COMFORT PEN NEEDLES 31G X 5 MM MISC USE FOUR TIMES PER DAY FOR INSULIN ADMINISTRATION (Patient taking differently: 4 (four) times daily. ) 200 each 12  . glucose blood (ACCU-CHEK GUIDE) test strip USE AS DIRECTED TO TEST BLOOD SUGAR THREE TIMES DAILY 100 each 12  . Lancet Device MISC Use as instructed 3 times daily 1 each 0  . Misc. Devices MISC Portable oxygen concentrator.  Diagnosis-chronic respiratory failure. 1 each 0  . Misc. Devices MISC Rollaor with seat. Dx: Congestive Heart Failure 1 each 0  . Misc. Devices MISC Scale; Dx - CHF 1 each 0  . Misc. [facility] bed.  Diagnosis CHF.  Lifetime use.  Weight 165 lbs. 1 each 0    Inpatient Medications    Scheduled Meds: . allopurinol  200 mg Oral QHS  . aspirin EC  81 mg Oral q AM  . atorvastatin  80 mg Oral QHS  . carvedilol  25 mg Oral BID WC  . DULoxetine  30 mg Oral QHS  . furosemide  80 mg Intravenous BID  . gabapentin  100 mg Oral QHS  . heparin  5,000 Units Subcutaneous Q8H  . insulin aspart  0-5 Units Subcutaneous QHS  . insulin aspart  0-9 Units Subcutaneous TID WC  . insulin glargine  15 Units Subcutaneous QHS  . pantoprazole  20 mg Oral Daily  . primidone  50 mg Oral QHS  . sodium chloride flush  3 mL Intravenous Q12H    Continuous Infusions: . sodium chloride      PRN Meds: sodium chloride, acetaminophen, loperamide, metoCLOPramide, nitroGLYCERIN, ondansetron (ZOFRAN) IV, sodium chloride flush   ALLERGIES   Allergies  Allergen Reactions  . Garlic Shortness Of Breath, Itching, Swelling and Other (See Comments)    "Raw garlic" = Hand itching and swelling  . Isosorbide Other (See Comments)    Sneezing and runny nose  . Latex Itching  . Morphine And Related Itching and Other (See Comments)    Headache   . Other Itching  and Other (See Comments)    Reaction to newspaper ink- Headaches, also    ROS   Pertinent items noted in HPI and remainder of comprehensive ROS otherwise negative.  Vitals   Vitals:  09/27/19 0425 09/27/19 0737 09/27/19 0919 09/27/19 1211  BP: (!) 112/48 119/67 133/65 (!) 120/58  Pulse: (!) 59 (!) 59 62 (!) 57  Resp: 18 16  16   Temp: 98.1 F (36.7 C) 98.3 F (36.8 C)  (!) 97.3 F (36.3 C)  TempSrc: Oral Oral  Oral  SpO2: 97% 100%  100%  Weight: 70.1 kg     Height:        Intake/Output Summary (Last 24 hours) at 09/27/2019 1507 Last data filed at 09/27/2019 1254 Gross per 24 hour  Intake 360 ml  Output 250 ml  Net 110 ml   Filed Weights   09/26/19 1833 09/27/19 0425  Weight: 70.1 kg 70.1 kg    Physical Exam   General appearance: alert and no distress Neck: JVD - a few cm above sternal notch, no carotid bruit and thyroid not enlarged, symmetric, no tenderness/mass/nodules Lungs: diminished breath sounds bibasilar Heart: regular rate and rhythm Abdomen: soft, non-tender; bowel sounds normal; no masses,  no organomegaly and mildly protuberant Extremities: edema trace to 1+ bilateral LE edmea Pulses: 2+ and symmetric Skin: Skin color, texture, turgor normal. No rashes or lesions Neurologic: Grossly normal Psych: Pleasant  Labs   Results for orders placed or performed during the hospital encounter of 09/26/19 (from the past 48 hour(s))  Troponin I (High Sensitivity)     Status: Abnormal   Collection Time: 09/26/19  1:34 PM  Result Value Ref Range   Troponin I (High Sensitivity) 125 (HH) <18 ng/L    Comment: CRITICAL RESULT CALLED TO, READ BACK BY AND VERIFIED WITH: J VAZQUEZ RN  [id number] BY A BENNETT (NOTE) Elevated high sensitivity troponin I (hsTnI) values and significant  changes across serial measurements may suggest ACS but many other  chronic and acute conditions are known to elevate hsTnI results.  Refer to the Links section for chest pain algorithms  and additional  guidance. Performed at [facility], [place] [address], [place], [place] [address]   Comprehensive metabolic panel     Status: Abnormal   Collection Time: 09/26/19  1:34 PM  Result Value Ref Range   Sodium 131 (L) 135 - 145 mmol/L   Potassium 3.8 3.5 - 5.1 mmol/L   Chloride 97 (L) 98 - 111 mmol/L   CO2 24 22 - 32 mmol/L   Glucose, Bld 219 (H) 70 - 99 mg/dL    Comment: Glucose reference range applies only to samples taken after fasting for at least 8 hours.   BUN 58 (H) 8 - 23 mg/dL   Creatinine, Ser 2.56 (H) 0.44 - 1.00 mg/dL   Calcium 8.6 (L) 8.9 - 10.3 mg/dL   Total Protein 5.6 (L) 6.5 - 8.1 g/dL   Albumin 2.8 (L) 3.5 - 5.0 g/dL   AST 20 15 - 41 U/L   ALT 14 0 - 44 U/L   Alkaline Phosphatase 93 38 - 126 U/L   Total Bilirubin 0.5 0.3 - 1.2 mg/dL   GFR calc non Af Amer 19 (L) >60 mL/min   GFR calc Af Amer 22 (L) >60 mL/min   Anion gap 10 5 - 15    Comment: Performed at [facility] [address], [place], [place] [address]  Magnesium     Status: None   Collection Time: 09/26/19  1:34 PM  Result Value Ref Range   Magnesium 2.1 1.7 - 2.4 mg/dL    Comment: Performed at [facility] [place],  [place] [address]  Brain natriuretic peptide     Status: Abnormal   Collection Time: 09/26/19  2:27 PM  Result Value Ref Range   B Natriuretic Peptide 1,420.6 (H) 0.0 - 100.0 pg/mL    Comment: Performed at [facility] [address] [place], [place] [address]  CBC with Differential/Platelet     Status: Abnormal   Collection Time: 09/26/19  2:27 PM  Result Value Ref Range   WBC 6.5 4.0 - 10.5 K/uL   RBC 3.11 (L) 3.87 - 5.11 MIL/uL   Hemoglobin 8.4 (L) 12.0 - 15.0 g/dL   HCT 28.1 (L) 36 - 46 %   MCV 90.4 80.0 - 100.0 fL   MCH 27.0 26.0 - 34.0 pg   MCHC 29.9 (L) 30.0 - 36.0 g/dL   RDW 17.4 (H) 11.5 - 15.5 %   Platelets 288 150 - 400 K/uL   nRBC 1.1 (H) 0.0 - 0.2 %   Neutrophils Relative % 71 %   Neutro Abs 4.7 1.7 - 7.7 K/uL    Lymphocytes Relative 17 %   Lymphs Abs 1.1 0.7 - 4.0 K/uL   Monocytes Relative 9 %   Monocytes Absolute 0.6 0 - 1 K/uL   Eosinophils Relative 1 %   Eosinophils Absolute 0.1 0 - 0 K/uL   Basophils Relative 1 %   Basophils Absolute 0.0 0 - 0 K/uL   Immature Granulocytes 1 %   Abs Immature Granulocytes 0.04 0.00 - 0.07 K/uL    Comment: Performed at [facility], [place] [address] [place], [place] [address]  Troponin I (High Sensitivity)     Status: Abnormal   Collection Time: 09/26/19  4:22 PM  Result Value Ref Range   Troponin I (High Sensitivity) 123 (HH) <18 ng/L    Comment: CRITICAL VALUE NOTED.  VALUE IS CONSISTENT WITH PREVIOUSLY REPORTED AND CALLED VALUE. (NOTE) Elevated high sensitivity troponin I (hsTnI) values and significant  changes across serial measurements may suggest ACS but many other  chronic and acute conditions are known to elevate hsTnI results.  Refer to the Links section for chest pain algorithms and additional  guidance. Performed at [facility], [place] [address], [place], [place] [address]   SARS Coronavirus 2 by RT PCR (hospital order, performed in [facility] hospital lab) Nasopharyngeal Nasopharyngeal Swab     Status: None   Collection Time: 09/26/19  4:22 PM   Specimen: Nasopharyngeal Swab  Result Value Ref Range   SARS Coronavirus 2 NEGATIVE NEGATIVE    Comment: (NOTE) SARS-CoV-2 target nucleic acids are NOT DETECTED.  The SARS-CoV-2 RNA is generally detectable in upper and lower respiratory specimens during the acute phase of infection. The lowest concentration of SARS-CoV-2 viral copies this assay can detect is 250 copies / mL. A negative result does not preclude SARS-CoV-2 infection and should not be used as the sole basis for treatment or other patient management decisions.  A negative result may occur with improper specimen collection / handling, submission of specimen other than nasopharyngeal swab, presence of viral mutation(s) within  the areas targeted by this assay, and inadequate number of viral copies (<250 copies / mL). A negative result must be combined with clinical observations, patient history, and epidemiological information.  Fact Sheet for Patients:   StrictlyIdeas.no  Fact Sheet for Healthcare Providers: BankingDealers.co.za  This test is not yet approved or  cleared by the [place] FDA and has been authorized for detection and/or diagnosis of SARS-CoV-2 by FDA  under an Emergency Use Authorization (EUA).  This EUA will remain in effect (meaning this test can be used) for the duration of the COVID-19 declaration under Section 564(b)(1) of the Act, 21 U.S.C. section 360bbb-3(b)(1), unless the authorization is terminated or revoked sooner.  Performed at [facility], [place] [address] [place], [place] [address]   TSH     Status: None   Collection Time: 09/26/19  4:22 PM  Result Value Ref Range   TSH 3.156 0.350 - 4.500 uIU/mL    Comment: Performed by a 3rd Generation assay with a functional sensitivity of <=0.01 uIU/mL. Performed at [facility], [place] [address], [place], [place] [address]   CBG monitoring, ED     Status: Abnormal   Collection Time: 09/26/19  4:42 PM  Result Value Ref Range   Glucose-Capillary 143 (H) 70 - 99 mg/dL    Comment: Glucose reference range applies only to samples taken after fasting for at least 8 hours.  Urinalysis, Routine w reflex microscopic     Status: Abnormal   Collection Time: 09/26/19  4:45 PM  Result Value Ref Range   Color, Urine AMBER (A) YELLOW    Comment: BIOCHEMICALS MAY BE AFFECTED BY COLOR   APPearance CLOUDY (A) CLEAR   Specific Gravity, Urine 1.014 1.005 - 1.030   pH 5.0 5.0 - 8.0   Glucose, UA 50 (A) NEGATIVE mg/dL   Hgb urine dipstick SMALL (A) NEGATIVE   Bilirubin Urine NEGATIVE NEGATIVE   Ketones, ur NEGATIVE NEGATIVE mg/dL   Protein, ur >=300 (A) NEGATIVE mg/dL   Nitrite NEGATIVE  NEGATIVE   Leukocytes,Ua SMALL (A) NEGATIVE   RBC / HPF 0-5 0 - 5 RBC/hpf   WBC, UA 6-10 0 - 5 WBC/hpf   Bacteria, UA MANY (A) NONE SEEN   Squamous Epithelial / LPF 0-5 0 - 5   Mucus PRESENT    Hyaline Casts, UA PRESENT     Comment: Performed at [facility], [address], [place], [place] [address]  Hemoglobin A1c     Status: Abnormal   Collection Time: 09/26/19  6:56 PM  Result Value Ref Range   Hgb A1c MFr Bld 7.1 (H) 4.8 - 5.6 %    Comment: (NOTE) Pre diabetes:          5.7%-6.4%  Diabetes:              >6.4%  Glycemic control for   <7.0% adults with diabetes    Mean Plasma Glucose 157.07 mg/dL    Comment: Performed at [facility] [address] [place], [place] [address]  Troponin I (High Sensitivity)     Status: Abnormal   Collection Time: 09/26/19  6:56 PM  Result Value Ref Range   Troponin I (High Sensitivity) 130 (HH) <18 ng/L    Comment: CRITICAL VALUE NOTED.  VALUE IS CONSISTENT WITH PREVIOUSLY REPORTED AND CALLED VALUE. (NOTE) Elevated high sensitivity troponin I (hsTnI) values and significant  changes across serial measurements may suggest ACS but many other  chronic and acute conditions are known to elevate hsTnI results.  Refer to the Links section for chest pain algorithms and additional  guidance. Performed at [facility], [place] [address], [place] [address]   CBC     Status: Abnormal   Collection Time: 09/26/19  6:57 PM  Result Value Ref Range   WBC 6.2 4.0 - 10.5 K/uL   RBC 3.31 (L) 3.87 - 5.11 MIL/uL  Hemoglobin 8.8 (L) 12.0 - 15.0 g/dL   HCT 30.0 (L) 36 - 46 %   MCV 90.6 80.0 - 100.0 fL   MCH 26.6 26.0 - 34.0 pg   MCHC 29.3 (L) 30.0 - 36.0 g/dL   RDW 17.4 (H) 11.5 - 15.5 %   Platelets 281 150 - 400 K/uL   nRBC 1.0 (H) 0.0 - 0.2 %    Comment: Performed at [facility] [address] [place], [place] [address]  Creatinine, serum     Status: Abnormal   Collection Time: 09/26/19  6:57 PM  Result Value Ref  Range   Creatinine, Ser 2.47 (H) 0.44 - 1.00 mg/dL   GFR calc non Af Amer 20 (L) >60 mL/min   GFR calc Af Amer 23 (L) >60 mL/min    Comment: Performed at [facility] [address], [place], [place] [address]  Glucose, capillary     Status: Abnormal   Collection Time: 09/26/19  9:11 PM  Result Value Ref Range   Glucose-Capillary 119 (H) 70 - 99 mg/dL    Comment: Glucose reference range applies only to samples taken after fasting for at least 8 hours.  Basic metabolic panel     Status: Abnormal   Collection Time: 09/27/19  6:12 AM  Result Value Ref Range   Sodium 135 135 - 145 mmol/L   Potassium 3.4 (L) 3.5 - 5.1 mmol/L   Chloride 100 98 - 111 mmol/L   CO2 24 22 - 32 mmol/L   Glucose, Bld 154 (H) 70 - 99 mg/dL    Comment: Glucose reference range applies only to samples taken after fasting for at least 8 hours.   BUN 62 (H) 8 - 23 mg/dL   Creatinine, Ser 2.59 (H) 0.44 - 1.00 mg/dL   Calcium 8.7 (L) 8.9 - 10.3 mg/dL   GFR calc non Af Amer 19 (L) >60 mL/min   GFR calc Af Amer 22 (L) >60 mL/min   Anion gap 11 5 - 15    Comment: Performed at [facility] [address] [place], [place] [address]  Glucose, capillary     Status: Abnormal   Collection Time: 09/27/19  6:16 AM  Result Value Ref Range   Glucose-Capillary 145 (H) 70 - 99 mg/dL    Comment: Glucose reference range applies only to samples taken after fasting for at least 8 hours.  Glucose, capillary     Status: Abnormal   Collection Time: 09/27/19  8:30 AM  Result Value Ref Range   Glucose-Capillary 133 (H) 70 - 99 mg/dL    Comment: Glucose reference range applies only to samples taken after fasting for at least 8 hours.  Glucose, capillary     Status: Abnormal   Collection Time: 09/27/19 12:12 PM  Result Value Ref Range   Glucose-Capillary 122 (H) 70 - 99 mg/dL    Comment: Glucose reference range applies only to samples taken after fasting for at least 8 hours.    ECG   Sinus rhythm at 68, repolarization  abnormality (6/24) - Personally Reviewed  Telemetry   Sinus rhythm - Personally Reviewed  Radiology   US RENAL  Result Date: 09/26/2019 CLINICAL DATA:  Worsening renal function. EXAM: RENAL / URINARY TRACT ULTRASOUND COMPLETE COMPARISON:  Renal ultrasound dated February 16, 2019. FINDINGS: Right Kidney: Renal measurements: 10.4 x 4.2 x 4.3 cm = volume: 97 mL . Echogenicity within normal limits. No mass or hydronephrosis visualized. Left Kidney: Renal measurements: 11.2  x 5.0 x 4.2 cm = volume: 121 mL. Echogenicity within normal limits. No mass or hydronephrosis visualized. Bladder: Appears normal for degree of bladder distention. Other: None. IMPRESSION: Normal renal ultrasound. Electronically Signed   By: Titus Dubin M.D.   On: 09/26/2019 17:44   DG Chest Portable 1 View  Result Date: 09/26/2019 CLINICAL DATA:  Chest pain shortness of breath and leg EXAM: PORTABLE CHEST 1 VIEW COMPARISON:  July 01, 2019 FINDINGS: There is unchanged cardiomegaly. There is diffusely increased interstitial markings seen throughout lungs. Trace blunting of the right costophrenic angle likely trace effusion. Shallow degree aeration with subsegmental atelectasis. No acute osseous abnormality. IMPRESSION: Mild cardiomegaly and interstitial edema. Trace right pleural effusion. Electronically Signed   By: Prudencio Pair M.D.   On: 09/26/2019 14:16    Cardiac Studies   N/A  Impression   1. Principal Problem: 2.   Acute on chronic systolic CHF (congestive heart failure) ([place]) 3. Active Problems: 4.   Hypertension 5.   Normochromic normocytic anemia 6.   Type 2 diabetes mellitus with diabetic neuropathy, unspecified ([place]) 7.   Hyperlipidemia LDL goal <70 8.   Acute renal failure superimposed on stage 3 chronic kidney disease (HCC) 9.   Elevated troponin 10.   Hyponatremia 11.   CHF exacerbation ([place]) 12.   Recommendation   1. Ms. Yan has unfortunately had significant decline in her renal function,  probably a cardiorenal syndrome. LVEF suprisingly was only moderately reduced at 40-45% by echo in 04/2019, but now may be worse. Will repeat limited echo - d/w Dr. Hollie Salk of nephrology, who will recommend higher dose lasix to see if we can get a response from the kidneys. If the LVEF is lower, may need RHC guided inotrope therapy and advanced heart failure consultation.  Thanks for the consultation. Cardiology will follow with you.  Time Spent Directly with Patient:  I have spent a total of 45 minutes with the patient reviewing hospital notes, telemetry, EKGs, labs and examining the patient as well as establishing an assessment and plan that was discussed personally with the patient.  > 50% of time was spent in direct patient care.  Length of Stay:  LOS: 1 day   Pixie Casino, MD, [facility], [place] Director of the Advanced Lipid Disorders &  Cardiovascular Risk Reduction Clinic Diplomate of the [employer]id[employer] Attending Cardiologist  Direct Dial: [phone]  Fax: [phone]  Website:  [email or web address] Pickeral 09/27/2019, 3:07 PM

## 2019-09-27 NOTE — Evaluation (Signed)
Occupational Therapy Evaluation Patient Details Name: Kara Hanson MRN: [id number] DOB: Kara Hanson Today's Date: 09/27/2019    History of Present Illness 64 y.o. female with medical history significant of hypertension, hyperlipidemia, insulin-dependent diabetes mellitus, systolic congestive heart failure with ejection fraction of 40 to 45%, morbid obesity, CKD stage IIIb, chronic hypoxemic respiratory failure-on 4 L of oxygen via nasal cannulae at home presents to emergency department due to worsening shortness of breath, leg edema since 1 week. Admitted 6/24 Chest x-ray shows mild cardiomegaly and interstitial edema, trace right pleural effusion.Admitted 6/24 for tratment of acute on chronic CHF   Clinical Impression   Pt was independent in self care prior to admission. She report fatiguing with showering and agrees she could benefit from a shower seat. Pt sleepy when not stimulated, may be medication related. She overall is functioning at a set up to min guard assist level. Do not anticipate pt will need post acute OT.    Follow Up Recommendations  No OT follow up    Equipment Recommendations  Tub/shower seat    Recommendations for Other Services       Precautions / Restrictions Precautions Precautions: Fall      Mobility Bed Mobility Overal bed mobility: Needs Assistance Bed Mobility: Sit to Supine       Sit to supine: Supervision   General bed mobility comments: increased time and effort, but no physical assist  Transfers Overall transfer level: Needs assistance Equipment used: 1 person hand held assist Transfers: Sit to/from Stand Sit to Stand: Supervision         General transfer comment: no physical assist from chair     Balance Overall balance assessment: Needs assistance Sitting-balance support: No upper extremity supported;Single extremity supported Sitting balance-Leahy Scale: Good Sitting balance - Comments: no LOB with donning socks, leans forward    Standing balance support: Single extremity supported Standing balance-Leahy Scale: Poor Standing balance comment: fair static balance                           ADL either performed or assessed with clinical judgement   ADL Overall ADL's : Needs assistance/impaired Eating/Feeding: Independent   Grooming: Supervision/safety;Standing   Upper Body Bathing: Set up;Sitting   Lower Body Bathing: Supervison/ safety;Sit to/from stand   Upper Body Dressing : Set up;Sitting   Lower Body Dressing: Supervision/safety;Sit to/from stand   Toilet Transfer: Min guard;Ambulation   Toileting- Clothing Manipulation and Hygiene: Supervision/safety;Sit to/from stand               Vision Baseline Vision/History: No visual deficits Additional Comments: reports vision was blurry when admitted, but has resolved     Perception     Praxis      Pertinent Vitals/Pain Pain Assessment: No/denies pain     Hand Dominance Right   Extremity/Trunk Assessment Upper Extremity Assessment Upper Extremity Assessment: Overall WFL for tasks assessed   Lower Extremity Assessment Lower Extremity Assessment: Defer to PT evaluation   Cervical / Trunk Assessment Cervical / Trunk Assessment: Normal   Communication Communication Communication: No difficulties   Cognition Arousal/Alertness: Lethargic;Suspect due to medications Behavior During Therapy: Flat affect Overall Cognitive Status: Within Functional Limits for tasks assessed                                     General Comments       Exercises  Shoulder Instructions      Home Living Family/patient expects to be discharged to:: Private residence Living Arrangements: Children Available Help at Discharge: Family;Available 24 hours/day Type of Home: House Home Access: Level entry     Home Layout: One level     Bathroom Shower/Tub: Teacher, early years/pre: Standard     Home Equipment: Environmental consultant  - 2 wheels          Prior Functioning/Environment Level of Independence: Needs assistance  Gait / Transfers Assistance Needed: uses RW at home needs a scooter at the store ADL's / Homemaking Assistance Needed: independent in ADL/iADLs   Comments: Uses 4 L of oxygen at home        OT Problem List: Impaired balance (sitting and/or standing)      OT Treatment/Interventions: Self-care/ADL training    OT Goals(Current goals can be found in the care plan section) Acute Rehab OT Goals Patient Stated Goal: return home OT Goal Formulation: With patient Time For Goal Achievement: 10/11/19 Potential to Achieve Goals: Good ADL Goals Pt Will Transfer to Toilet: with modified independence;ambulating Pt Will Perform Tub/Shower Transfer: Tub transfer;shower seat;with supervision  OT Frequency: Min 2X/week   Barriers to D/C:            Co-evaluation              AM-PAC OT "6 Clicks" Daily Activity     Outcome Measure Help from another person eating meals?: None Help from another person taking care of personal grooming?: A Little Help from another person toileting, which includes using toliet, bedpan, or urinal?: A Little Help from another person bathing (including washing, rinsing, drying)?: A Little Help from another person to put on and taking off regular upper body clothing?: None Help from another person to put on and taking off regular lower body clothing?: A Little 6 Click Score: 20   End of Session Equipment Utilized During Treatment: Oxygen (4L)  Activity Tolerance: Patient tolerated treatment well Patient left: in bed;with call bell/phone within reach;with bed alarm set  OT Visit Diagnosis: Other abnormalities of gait and mobility (R26.89);Unsteadiness on feet (R26.81)                Time: 5993-5701 OT Time Calculation (min): 17 min Charges:  OT General Charges $OT Visit: 1 Visit OT Evaluation $OT Eval Low Complexity: 1 Low  Nestor Lewandowsky, OTR/L Acute  Rehabilitation Services Pager: [phone] Office: [phone]  Malka So 09/27/2019, 2:56 PM

## 2019-09-28 DIAGNOSIS — N183 Chronic kidney disease, stage 3 unspecified: Secondary | ICD-10-CM

## 2019-09-28 LAB — BASIC METABOLIC PANEL
Anion gap: 11 (ref 5–15)
BUN: 61 mg/dL — ABNORMAL HIGH (ref 8–23)
CO2: 25 mmol/L (ref 22–32)
Calcium: 8.8 mg/dL — ABNORMAL LOW (ref 8.9–10.3)
Chloride: 100 mmol/L (ref 98–111)
Creatinine, Ser: 2.34 mg/dL — ABNORMAL HIGH (ref 0.44–1.00)
GFR calc Af Amer: 25 mL/min — ABNORMAL LOW (ref >60)
GFR calc non Af Amer: 21 mL/min — ABNORMAL LOW (ref >60)
Glucose, Bld: 71 mg/dL (ref 70–99)
Potassium: 4.3 mmol/L (ref 3.5–5.1)
Sodium: 136 mmol/L (ref 135–145)

## 2019-09-28 LAB — CBC
HCT: 26.5 % — ABNORMAL LOW (ref 36.0–46.0)
Hemoglobin: 7.8 g/dL — ABNORMAL LOW (ref 12.0–15.0)
MCH: 26.8 pg (ref 26.0–34.0)
MCHC: 29.4 g/dL — ABNORMAL LOW (ref 30.0–36.0)
MCV: 91.1 fL (ref 80.0–100.0)
Platelets: 232 10*3/uL (ref 150–400)
RBC: 2.91 MIL/uL — ABNORMAL LOW (ref 3.87–5.11)
RDW: 17.5 % — ABNORMAL HIGH (ref 11.5–15.5)
WBC: 4.5 10*3/uL (ref 4.0–10.5)
nRBC: 0 % (ref 0.0–0.2)

## 2019-09-28 LAB — MAGNESIUM: Magnesium: 2.3 mg/dL (ref 1.7–2.4)

## 2019-09-28 LAB — GLUCOSE, CAPILLARY
Glucose-Capillary: 73 mg/dL (ref 70–99)
Glucose-Capillary: 95 mg/dL (ref 70–99)
Glucose-Capillary: 124 mg/dL — ABNORMAL HIGH (ref 70–99)
Glucose-Capillary: 105 mg/dL — ABNORMAL HIGH (ref 70–99)

## 2019-09-28 MED ORDER — CARVEDILOL 12.5 MG PO TABS
12.5000 mg | ORAL_TABLET | Freq: Two times a day (BID) | ORAL | Status: DC
  Administered 2019-09-28 – 2019-09-30 (×4): 12.5 mg via ORAL
  Filled 2019-09-28 (×4): qty 1

## 2019-09-28 MED ORDER — INSULIN ASPART 100 UNIT/ML ~~LOC~~ SOLN
0.0000 [IU] | Freq: Three times a day (TID) | SUBCUTANEOUS | Status: DC
  Administered 2019-09-28 – 2019-10-04 (×5): 1 [IU] via SUBCUTANEOUS
  Administered 2019-10-05: 2 [IU] via SUBCUTANEOUS
  Administered 2019-10-05: 1 [IU] via SUBCUTANEOUS
  Administered 2019-10-06 – 2019-10-07 (×2): 2 [IU] via SUBCUTANEOUS

## 2019-09-28 MED ORDER — FUROSEMIDE 10 MG/ML IJ SOLN
120.0000 mg | Freq: Three times a day (TID) | INTRAVENOUS | Status: DC
  Administered 2019-09-28 – 2019-09-30 (×6): 120 mg via INTRAVENOUS
  Filled 2019-09-28 (×2): qty 12
  Filled 2019-09-28 (×3): qty 10
  Filled 2019-09-28 (×2): qty 12
  Filled 2019-09-28: qty 10

## 2019-09-28 NOTE — Progress Notes (Signed)
[facility] Progress Note    Assessment/ Plan:   1.  Acute on chronic systolic CHF exacerbation: cardiology consulted.  Repeat TTE showing worsening EF, 20-25%.  Trops flat.  Will defer to cardiology but wonder if n/v reflective of low output state, may be worthwhile to ask A[facility] what their thoughts are.    2.  AKI on CKD 3b: likely cardiorenal +/- hypotension.  Cr up to 2.5 from baseline of 1.9, improved slightly.  Renal US without obstruction. Increase Lasix to 120 IV TID.  Weights don't seem to have decreased from yesterday.  Follow closely    3.  Acute hypoxic RF: on O2, wean as above with vol removal  4.  DM II: per primary  5.  Dispo: pending   Subjective:    Weights not down, some UOP but looks sluggish.  Cr is down to 2.3 this AM.  Had crackers and coffee this AM but still bloated.  EF 20-25% on TTE yesterday.     Objective:   BP 123/69 (BP Location: Right Arm)   Pulse (!) 58   Temp (!) 97.5 F (36.4 C) (Oral)   Resp 18   Ht 5' (1.524 m)   Wt 70.1 kg   SpO2 99%   BMI 30.18 kg/m   Intake/Output Summary (Last 24 hours) at 09/28/2019 1017 Last data filed at 09/28/2019 0841 Gross per 24 hour  Intake 240 ml  Output 1350 ml  Net -1110 ml   Weight change: -0.136 kg  Physical Exam: GEN sleeping, arousable HEENT EOMI PERRL sclerae nonicteric NECK + JVD PULM on O2, normal WOB, bibasilar crackles CV RRR ABD soft some abd distention EXT 1+ LE edema, slightly improved NEURO AAO x 3 no asterixis SKIN no rashes  Imaging: US RENAL  Result Date: 09/26/2019 CLINICAL DATA:  Worsening renal function. EXAM: RENAL / URINARY TRACT ULTRASOUND COMPLETE COMPARISON:  Renal ultrasound dated February 16, 2019. FINDINGS: Right Kidney: Renal measurements: 10.4 x 4.2 x 4.3 cm = volume: 97 mL . Echogenicity within normal limits. No mass or hydronephrosis visualized. Left Kidney: Renal measurements: 11.2 x 5.0 x 4.2 cm = volume: 121 mL. Echogenicity within normal  limits. No mass or hydronephrosis visualized. Bladder: Appears normal for degree of bladder distention. Other: None. IMPRESSION: Normal renal ultrasound. Electronically Signed   By: Titus Dubin M.D.   On: 09/26/2019 17:44   DG Chest Portable 1 View  Result Date: 09/26/2019 CLINICAL DATA:  Chest pain shortness of breath and leg EXAM: PORTABLE CHEST 1 VIEW COMPARISON:  July 01, 2019 FINDINGS: There is unchanged cardiomegaly. There is diffusely increased interstitial markings seen throughout lungs. Trace blunting of the right costophrenic angle likely trace effusion. Shallow degree aeration with subsegmental atelectasis. No acute osseous abnormality. IMPRESSION: Mild cardiomegaly and interstitial edema. Trace right pleural effusion. Electronically Signed   By: Prudencio Pair M.D.   On: 09/26/2019 14:16   ECHOCARDIOGRAM LIMITED  Result Date: 09/27/2019    ECHOCARDIOGRAM LIMITED REPORT   Patient Name:   SHIMEKA BACOT Date of Exam: 09/27/2019 Medical Rec #:  [id number]       Height:       60.0 in Accession #:    [id number]      Weight:       154.5 lb Date of Birth:  10-31-1955       BSA:          1.673 m Patient Age:    64 years        BP:  120/58 mmHg Patient Gender: F               HR:           58 bpm. Exam Location:  Inpatient Procedure: Limited Echo, Limited Color Doppler and Cardiac Doppler Indications:    congestive heart failure 428.0  History:        Patient has prior history of Echocardiogram examinations, most                 recent 04/19/2019. CHF; Risk Factors:Diabetes.  Sonographer:    Johny Chess Referring Phys: [place]  1. Left ventricular ejection fraction, by estimation, is 20 to 25%. The left ventricle has severely decreased function. The left ventricle demonstrates global hypokinesis. The left ventricular internal cavity size was mildly dilated. There is mild asymmetric left ventricular hypertrophy of the inferolateral segment. Left ventricular  diastolic parameters are consistent with Grade III diastolic dysfunction (restrictive). Elevated left ventricular end-diastolic pressure.  2. Right ventricular systolic function is normal. The right ventricular size is normal. There is mildly elevated pulmonary artery systolic pressure. The estimated right ventricular systolic pressure is 97.3 mmHg.  3. Left atrial size was moderately dilated.  4. The mitral valve is grossly normal. Mild mitral valve regurgitation due to LV dysfunction with leaflet tenting.  5. Tricuspid valve regurgitation is moderate.  6. The aortic valve is abnormal. AV gradient may be underestimated due to LV dysfunction.  7. The inferior vena cava is normal in size with <50% respiratory variability, suggesting right atrial pressure of 8 mmHg. FINDINGS  Left Ventricle: Left ventricular ejection fraction, by estimation, is 20 to 25%. The left ventricle has severely decreased function. The left ventricle demonstrates global hypokinesis. The left ventricular internal cavity size was mildly dilated. There is mild asymmetric left ventricular hypertrophy of the inferolateral segment. Elevated left ventricular end-diastolic pressure. Right Ventricle: The right ventricular size is normal. No increase in right ventricular wall thickness. Right ventricular systolic function is normal. There is mildly elevated pulmonary artery systolic pressure. The tricuspid regurgitant velocity is 2.91  m/s, and with an assumed right atrial pressure of 8 mmHg, the estimated right ventricular systolic pressure is 53.2 mmHg. Left Atrium: Left atrial size was moderately dilated. Right Atrium: Right atrial size was normal in size. Mitral Valve: The mitral valve is grossly normal. Mild mitral annular calcification. Mild mitral valve regurgitation. Tricuspid Valve: The tricuspid valve is grossly normal. Tricuspid valve regurgitation is moderate. Aortic Valve: The aortic valve is abnormal. There is mild calcification of the  aortic valve. Aortic valve mean gradient measures 4.0 mmHg. Pulmonic Valve: The pulmonic valve was normal in structure. Pulmonic valve regurgitation is trivial. Venous: The inferior vena cava is normal in size with less than 50% respiratory variability, suggesting right atrial pressure of 8 mmHg.  LEFT VENTRICLE PLAX 2D LVIDd:         5.60 cm LVIDs:         4.70 cm LV PW:         1.10 cm LV IVS:        0.90 cm LVOT diam:     1.90 cm LV SV:         37 LV SV Index:   22 LVOT Area:     2.84 cm  IVC IVC diam: 1.90 cm LEFT ATRIUM         Index LA diam:    4.00 cm 2.39 cm/m  AORTIC VALVE AV Mean Grad: 4.0 mmHg  LVOT Vmax:    56.70 cm/s LVOT Vmean:   39.400 cm/s LVOT VTI:     0.129 m TRICUSPID VALVE TR Peak grad:   33.9 mmHg TR Vmax:        291.00 cm/s  SHUNTS Systemic VTI:  0.13 m Systemic Diam: 1.90 cm Cherlynn Kaiser MD Electronically signed by Cherlynn Kaiser MD Signature Date/Time: 09/27/2019/5:27:11 PM    Final     Labs: BMET Recent Labs  Lab 09/26/19 1334 09/26/19 1857 09/27/19 0612 09/28/19 0731  NA 131*  --  135 136  K 3.8  --  3.4* 4.3  CL 97*  --  100 100  CO2 24  --  24 25  GLUCOSE 219*  --  154* 71  BUN 58*  --  62* 61*  CREATININE 2.56* 2.47* 2.59* 2.34*  CALCIUM 8.6*  --  8.7* 8.8*   CBC Recent Labs  Lab 09/26/19 1427 09/26/19 1857 09/28/19 0731  WBC 6.5 6.2 4.5  NEUTROABS 4.7  --   --   HGB 8.4* 8.8* 7.8*  HCT 28.1* 30.0* 26.5*  MCV 90.4 90.6 91.1  PLT 288 281 232    Medications:    . allopurinol  200 mg Oral QHS  . aspirin EC  81 mg Oral q AM  . atorvastatin  80 mg Oral QHS  . carvedilol  25 mg Oral BID WC  . DULoxetine  30 mg Oral QHS  . gabapentin  100 mg Oral QHS  . heparin  5,000 Units Subcutaneous Q8H  . insulin aspart  0-5 Units Subcutaneous QHS  . insulin aspart  0-9 Units Subcutaneous TID WC  . insulin glargine  15 Units Subcutaneous QHS  . pantoprazole  20 mg Oral Daily  . primidone  50 mg Oral QHS  . sodium chloride flush  3 mL Intravenous Q12H       Madelon Lips MD 09/28/2019, 10:17 AM

## 2019-09-28 NOTE — Progress Notes (Signed)
Progress Note  Patient Name: Kara Hanson Date of Encounter: 09/28/2019  Primary Cardiologist: Candee Furbish, MD  Subjective   Sitting in bedside chair.  Ambulated some in the hall yesterday with PT.  Not short of breath at rest.  No chest pain.  Inpatient Medications    Scheduled Meds: . allopurinol  200 mg Oral QHS  . aspirin EC  81 mg Oral q AM  . atorvastatin  80 mg Oral QHS  . carvedilol  25 mg Oral BID WC  . DULoxetine  30 mg Oral QHS  . gabapentin  100 mg Oral QHS  . heparin  5,000 Units Subcutaneous Q8H  . insulin aspart  0-5 Units Subcutaneous QHS  . insulin aspart  0-9 Units Subcutaneous TID WC  . insulin glargine  15 Units Subcutaneous QHS  . pantoprazole  20 mg Oral Daily  . primidone  50 mg Oral QHS  . sodium chloride flush  3 mL Intravenous Q12H   Continuous Infusions: . sodium chloride    . furosemide     PRN Meds: sodium chloride, acetaminophen, loperamide, metoCLOPramide, nitroGLYCERIN, ondansetron (ZOFRAN) IV, sodium chloride flush   Vital Signs    Vitals:   09/28/19 0300 09/28/19 0344 09/28/19 0816 09/28/19 1119  BP:  117/74 123/69 109/61  Pulse:  (!) 57 (!) 58 60  Resp:  16 18 16   Temp:  97.6 F (36.4 C) (!) 97.5 F (36.4 C) 98.1 F (36.7 C)  TempSrc:  Oral Oral Oral  SpO2:  99% 99% 100%  Weight: 70 kg 70.1 kg    Height:        Intake/Output Summary (Last 24 hours) at 09/28/2019 1138 Last data filed at 09/28/2019 0841 Gross per 24 hour  Intake 240 ml  Output 1100 ml  Net -860 ml   Filed Weights   09/27/19 0425 09/28/19 0300 09/28/19 0344  Weight: 70.1 kg 70 kg 70.1 kg    Telemetry    Sinus rhythm.  Personally reviewed.  ECG    An ECG dated 09/26/2019 was personally reviewed today and demonstrated:  Sinus rhythm with decreased R wave progression, diffuse nonspecific ST-T wave abnormalities.  Physical Exam   GEN: No acute distress.   Neck: No JVD. Cardiac: RRR, soft systolic murmur, no gallop.  Respiratory: Nonlabored.  Clear to auscultation bilaterally. GI: Soft, nontender, bowel sounds present. MS: 1-2 lower leg edema; No deformity. Neuro:  Nonfocal. Psych: Alert and oriented x 3. Normal affect.  Labs    Chemistry Recent Labs  Lab 09/26/19 1334 09/26/19 1334 09/26/19 1857 09/27/19 0612 09/28/19 0731  NA 131*  --   --  135 136  K 3.8  --   --  3.4* 4.3  CL 97*  --   --  100 100  CO2 24  --   --  24 25  GLUCOSE 219*  --   --  154* 71  BUN 58*  --   --  62* 61*  CREATININE 2.56*   < > 2.47* 2.59* 2.34*  CALCIUM 8.6*  --   --  8.7* 8.8*  PROT 5.6*  --   --   --   --   ALBUMIN 2.8*  --   --   --   --   AST 20  --   --   --   --   ALT 14  --   --   --   --   ALKPHOS 93  --   --   --   --  BILITOT 0.5  --   --   --   --   GFRNONAA 19*   < > 20* 19* 21*  GFRAA 22*   < > 23* 22* 25*  ANIONGAP 10  --   --  11 11   < > = values in this interval not displayed.     Hematology Recent Labs  Lab 09/26/19 1427 09/26/19 1857 09/28/19 0731  WBC 6.5 6.2 4.5  RBC 3.11* 3.31* 2.91*  HGB 8.4* 8.8* 7.8*  HCT 28.1* 30.0* 26.5*  MCV 90.4 90.6 91.1  MCH 27.0 26.6 26.8  MCHC 29.9* 29.3* 29.4*  RDW 17.4* 17.4* 17.5*  PLT 288 281 232    Cardiac Enzymes Recent Labs  Lab 09/26/19 1334 09/26/19 1622 09/26/19 1856  TROPONINIHS 125* 123* 130*    BNP Recent Labs  Lab 09/26/19 1427  BNP 1,420.6*     Radiology    US RENAL  Result Date: 09/26/2019 CLINICAL DATA:  Worsening renal function. EXAM: RENAL / URINARY TRACT ULTRASOUND COMPLETE COMPARISON:  Renal ultrasound dated February 16, 2019. FINDINGS: Right Kidney: Renal measurements: 10.4 x 4.2 x 4.3 cm = volume: 97 mL . Echogenicity within normal limits. No mass or hydronephrosis visualized. Left Kidney: Renal measurements: 11.2 x 5.0 x 4.2 cm = volume: 121 mL. Echogenicity within normal limits. No mass or hydronephrosis visualized. Bladder: Appears normal for degree of bladder distention. Other: None. IMPRESSION: Normal renal ultrasound.  Electronically Signed   By: Titus Dubin M.D.   On: 09/26/2019 17:44   DG Chest Portable 1 View  Result Date: 09/26/2019 CLINICAL DATA:  Chest pain shortness of breath and leg EXAM: PORTABLE CHEST 1 VIEW COMPARISON:  July 01, 2019 FINDINGS: There is unchanged cardiomegaly. There is diffusely increased interstitial markings seen throughout lungs. Trace blunting of the right costophrenic angle likely trace effusion. Shallow degree aeration with subsegmental atelectasis. No acute osseous abnormality. IMPRESSION: Mild cardiomegaly and interstitial edema. Trace right pleural effusion. Electronically Signed   By: Prudencio Pair M.D.   On: 09/26/2019 14:16   ECHOCARDIOGRAM LIMITED  Result Date: 09/27/2019    ECHOCARDIOGRAM LIMITED REPORT   Patient Name:   Kara Hanson Date of Exam: 09/27/2019 Medical Rec #:  [id number]       Height:       60.0 in Accession #:    [id number]      Weight:       154.5 lb Date of Birth:  Jul 31, 1955       BSA:          1.673 m Patient Age:    64 years        BP:           120/58 mmHg Patient Gender: F               HR:           58 bpm. Exam Location:  Inpatient Procedure: Limited Echo, Limited Color Doppler and Cardiac Doppler Indications:    congestive heart failure 428.0  History:        Patient has prior history of Echocardiogram examinations, most                 recent 04/19/2019. CHF; Risk Factors:Diabetes.  Sonographer:    Johny Chess Referring Phys: [place]  1. Left ventricular ejection fraction, by estimation, is 20 to 25%. The left ventricle has severely decreased function. The left ventricle demonstrates global hypokinesis. The left ventricular internal cavity size  was mildly dilated. There is mild asymmetric left ventricular hypertrophy of the inferolateral segment. Left ventricular diastolic parameters are consistent with Grade III diastolic dysfunction (restrictive). Elevated left ventricular end-diastolic pressure.  2. Right ventricular  systolic function is normal. The right ventricular size is normal. There is mildly elevated pulmonary artery systolic pressure. The estimated right ventricular systolic pressure is 93.7 mmHg.  3. Left atrial size was moderately dilated.  4. The mitral valve is grossly normal. Mild mitral valve regurgitation due to LV dysfunction with leaflet tenting.  5. Tricuspid valve regurgitation is moderate.  6. The aortic valve is abnormal. AV gradient may be underestimated due to LV dysfunction.  7. The inferior vena cava is normal in size with <50% respiratory variability, suggesting right atrial pressure of 8 mmHg. FINDINGS  Left Ventricle: Left ventricular ejection fraction, by estimation, is 20 to 25%. The left ventricle has severely decreased function. The left ventricle demonstrates global hypokinesis. The left ventricular internal cavity size was mildly dilated. There is mild asymmetric left ventricular hypertrophy of the inferolateral segment. Elevated left ventricular end-diastolic pressure. Right Ventricle: The right ventricular size is normal. No increase in right ventricular wall thickness. Right ventricular systolic function is normal. There is mildly elevated pulmonary artery systolic pressure. The tricuspid regurgitant velocity is 2.91  m/s, and with an assumed right atrial pressure of 8 mmHg, the estimated right ventricular systolic pressure is 16.9 mmHg. Left Atrium: Left atrial size was moderately dilated. Right Atrium: Right atrial size was normal in size. Mitral Valve: The mitral valve is grossly normal. Mild mitral annular calcification. Mild mitral valve regurgitation. Tricuspid Valve: The tricuspid valve is grossly normal. Tricuspid valve regurgitation is moderate. Aortic Valve: The aortic valve is abnormal. There is mild calcification of the aortic valve. Aortic valve mean gradient measures 4.0 mmHg. Pulmonic Valve: The pulmonic valve was normal in structure. Pulmonic valve regurgitation is trivial.  Venous: The inferior vena cava is normal in size with less than 50% respiratory variability, suggesting right atrial pressure of 8 mmHg.  LEFT VENTRICLE PLAX 2D LVIDd:         5.60 cm LVIDs:         4.70 cm LV PW:         1.10 cm LV IVS:        0.90 cm LVOT diam:     1.90 cm LV SV:         37 LV SV Index:   22 LVOT Area:     2.84 cm  IVC IVC diam: 1.90 cm LEFT ATRIUM         Index LA diam:    4.00 cm 2.39 cm/m  AORTIC VALVE AV Mean Grad: 4.0 mmHg LVOT Vmax:    56.70 cm/s LVOT Vmean:   39.400 cm/s LVOT VTI:     0.129 m TRICUSPID VALVE TR Peak grad:   33.9 mmHg TR Vmax:        291.00 cm/s  SHUNTS Systemic VTI:  0.13 m Systemic Diam: 1.90 cm Cherlynn Kaiser MD Electronically signed by Cherlynn Kaiser MD Signature Date/Time: 09/27/2019/5:27:11 PM    Final     Patient Profile     64 y.o. female with a history of hypertension, hyperlipidemia, type 2 diabetes mellitus, nonischemic cardiomyopathy with LVEF previously 40 to 45%, and CKD stage IIIb.  She is currently admitted with acute on chronic renal failure and evidence of worsening cardiomyopathy.  Question of cardiorenal syndrome.  Assessment & Plan    1.  Nonischemic cardiomyopathy  with decline in LVEF to the range of 20 to 25% by recent assessment, restrictive diastolic filling pattern, normal RV function with estimated RVSP 42 mmHg.  Right and left heart catheterization from November 2020 demonstrated mild nonobstructive coronary artery disease with elevated filling pressures but normal cardiac output.  2.  Acute on chronic renal failure with CKD stage IIIb at baseline.  Creatinine up to 2.59 at peak, 2.34 today.  Nephrology following.  She is currently on high-dose IV Lasix.  3.  Anemia on chronic anemia, with anemia of chronic disease at baseline.  Hemoglobin 7.8 down from 8.8.  4.  Type 2 diabetes mellitus, continues on insulin.  Recent hemoglobin A1c 7.1%.  5.  Mildly elevated high-sensitivity troponin I levels and in flat pattern, 120-130 not  suggestive of ACS and more in line with heart failure in the setting of CKD.  Continue high-dose IV Lasix.  Would reduce Coreg dose to half in case there is a component of cardiorenal syndrome with low output.  Her creatinine has come down somewhat however, but urine output still has not picked up very much.  Continue aspirin and Lipitor, obviously would hold off on ARB/Entresto.  Suspect that a right heart catheterization would be useful and will plan to involve the advanced heart failure team for discussion tomorrow.  Signed, Rozann Lesches, MD  09/28/2019, 11:38 AM

## 2019-09-28 NOTE — Progress Notes (Signed)
PROGRESS NOTE    Kaleyah Labreck  TFT:[id number] DOB: 1955-12-05 DOA: 09/26/2019 PCP: [name], MD    Chief Complaint  Patient presents with  . Shortness of Breath  . Leg Swelling    Brief Narrative:  Maelle Sheaffer is a 64 y.o. female with medical history significant of hypertension, hyperlipidemia, insulin-dependent diabetes mellitus, systolic congestive heart failure with ejection fraction of 40 to 45%, morbid obesity, CKD stage IIIb, chronic hypoxemic respiratory failure-on 4 L of oxygen via nasal cannula at home presented to emergency department due to worsening shortness of breath, leg edema since 1 week.  She also reported orthopnea, PND, weight gain of 6 pounds.  Reports midsternal chest pain which is constant, 8 out of 10, nonradiating, no aggravating or relieving factors.  Questionable compliance with Lasix.  She is followed by cardiology Dr. Lorel Monaco seen in April 2021. denies headache, lightheadedness, dizziness, cough, congestion, generalized weakness.  She lives with her daughter at home.   ED Course: Upon arrival to ED: Patient's vital signs stable.  She is afebrile with no leukocytosis.  CBC shows anemia of chronic disease.  CMP shows worsening kidney function.  BNP elevated at 1420, troponin: 125, EKG shows sinus rhythm, right axis deviation, diffuse T wave inversion similar to previous EKG.  No ST elevation or depression noted.  COVID-19 negative, sodium 131, chloride: 97, chest x-ray shows mild cardiomegaly and interstitial edema, trace right pleural effusion. Admitted for acute on chronic systolic CHF exacerbation.   Assessment & Plan:   Principal Problem:   Acute on chronic systolic CHF (congestive heart failure) (HCC) Active Problems:   Hypertension   Normochromic normocytic anemia   Type 2 diabetes mellitus with diabetic neuropathy, unspecified (HCC)   Hyperlipidemia LDL goal <70   Acute renal failure superimposed on stage 3 chronic kidney  disease (HCC)   Elevated troponin   Hyponatremia   CHF exacerbation (HCC)   Cardiorenal syndrome   Acute on chronic systolic CHF: -Patient presented with with worsening shortness of breath, leg edema, weight gain of 6 pounds, orthopnea, PND.  Chest x-ray showed mild cardiomegaly and interstitial edema with trace right pleural effusion.  BNP elevated at 1420.  Troponin: 125. -COVID-19 negative.   -She is supposed to be on Lasix 80 mg BID at home.  -Repeat TTE showing worsening EF 20-25%. -Lasix increased to 120 mg IV 3 times daily per nephrology. Continue her home meds-aspirin, statin, Coreg -Trend troponin.  Monitor kidney function closely. -High suspicion for cardiorenal syndrome.  Cardiology following.  Appreciate help.  AKI on CKD stage IIIb: -Likely secondary to cardiorenal syndrome & dehydration secondary to diarrhea -Renal ultrasound did not show any acute findings.  Avoid nephrotoxic medication.   -Consulted nephrology.  On high-dose IV Lasix per nephrology. -Continue to monitor BUN/trending.  Atypical chest pain/elevated troponin:  -Likely demand ischemia.  EKG: No acute changes. -Nitro PRN -Denies having any pain today. -Consulted cardiology.  Appreciate help.  Hyponatremia/hypochloremia: Likely secondary to Lasix -Monitor electrolytes.  Chronic hypoxemic respiratory failure: Secondary to underlying systolic CHF  -on 4 L of oxygen at home. -Continue same -  No wheezing noted on exam.  Chest x-ray is negative for infection  Hypertension: Stable -Continue Coreg -Continue to monitor blood pressure closely and adjust medications as needed.  Insulin-dependent diabetes mellitus: HbA1c 7.1. -Continue Lantus 15 units at bedtime.  Continue to monitor Accu-Cheks, insulin sliding scale.  Adjust dose accordingly.  Hyperlipidemia: Continue statin  Diabetic neuropathy: Continue gabapentin and Cymbalta  GERD: Continue PPI  Anemia of chronic disease: -Hemoglobin  this morning 7.8. No signs of active bleeding. -Monitor H&H closely.  Transfuse as needed.  Decreased urinary output: -Continue strict intake and output.    Diarrhea: Improving - denies having any diarrhea or abdominal pain at this time -Lopermide PRN  Gout: Cont. Allopurinol.  Renally dosed.   DVT prophylaxis: Heparin subcutaneous Code Status: Full code Family Communication: No family at bedside Disposition:  Home?  Status is: Inpatient  Remains inpatient appropriate because:Inpatient level of care appropriate due to severity of illness   Dispo: The patient is from: Home              Anticipated d/c is to: Home              Anticipated d/c date is: 3 days              Patient currently is not medically stable to d/c.       Consultants:   Cardiology  Nephrology  Procedures:  None   Antimicrobials:  None    Subjective: Remains on oxygen by nasal cannula.  Denies having any chest pain at this time.  Objective: Vitals:   09/28/19 0300 09/28/19 0344 09/28/19 0816 09/28/19 1119  BP:  117/74 123/69 109/61  Pulse:  (!) 57 (!) 58 60  Resp:  16 18 16   Temp:  97.6 F (36.4 C) (!) 97.5 F (36.4 C) 98.1 F (36.7 C)  TempSrc:  Oral Oral Oral  SpO2:  99% 99% 100%  Weight: 70 kg 70.1 kg    Height:        Intake/Output Summary (Last 24 hours) at 09/28/2019 1651 Last data filed at 09/28/2019 1427 Gross per 24 hour  Intake 240 ml  Output 1550 ml  Net -1310 ml   Filed Weights   09/27/19 0425 09/28/19 0300 09/28/19 0344  Weight: 70.1 kg 70 kg 70.1 kg    Examination:  General exam: Ill-appearing female, awake, not in any acute distress Respiratory system: Decreased breath sound lower lobes, crackles, no rhonchi or wheezing Cardiovascular system: S1 & S2 heard, RRR.   Gastrointestinal system: Abdomen distended, nontender, positive bowel sounds  Central nervous system: Generalized weakness without focal deficits Extremities: Lower extremity edema Skin:  No rashes, lesions or ulcers Psychiatry: Judgement and insight appear normal. Mood & affect appropriate.     Data Reviewed: I have personally reviewed following labs and imaging studies  CBC: Recent Labs  Lab 09/26/19 1427 09/26/19 1857 09/28/19 0731  WBC 6.5 6.2 4.5  NEUTROABS 4.7  --   --   HGB 8.4* 8.8* 7.8*  HCT 28.1* 30.0* 26.5*  MCV 90.4 90.6 91.1  PLT 288 281 008    Basic Metabolic Panel: Recent Labs  Lab 09/26/19 1334 09/26/19 1857 09/27/19 0612 09/28/19 0731  NA 131*  --  135 136  K 3.8  --  3.4* 4.3  CL 97*  --  100 100  CO2 24  --  24 25  GLUCOSE 219*  --  154* 71  BUN 58*  --  62* 61*  CREATININE 2.56* 2.47* 2.59* 2.34*  CALCIUM 8.6*  --  8.7* 8.8*  MG 2.1  --   --  2.3    GFR: Estimated Creatinine Clearance: 21.2 mL/min (A) (by C-G formula based on SCr of 2.34 mg/dL (H)).  Liver Function Tests: Recent Labs  Lab 09/26/19 1334  AST 20  ALT 14  ALKPHOS 93  BILITOT 0.5  PROT 5.6*  ALBUMIN 2.8*  CBG: Recent Labs  Lab 09/27/19 1730 09/27/19 2136 09/28/19 0616 09/28/19 1117 09/28/19 1638  GLUCAP 135* 101* 73 95 124*     Recent Results (from the past 240 hour(s))  SARS Coronavirus 2 by RT PCR (hospital order, performed in [facility] hospital lab) Nasopharyngeal Nasopharyngeal Swab     Status: None   Collection Time: 09/26/19  4:22 PM   Specimen: Nasopharyngeal Swab  Result Value Ref Range Status   SARS Coronavirus 2 NEGATIVE NEGATIVE Final    Comment: (NOTE) SARS-CoV-2 target nucleic acids are NOT DETECTED.  The SARS-CoV-2 RNA is generally detectable in upper and lower respiratory specimens during the acute phase of infection. The lowest concentration of SARS-CoV-2 viral copies this assay can detect is 250 copies / mL. A negative result does not preclude SARS-CoV-2 infection and should not be used as the sole basis for treatment or other patient management decisions.  A negative result may occur with improper specimen collection  / handling, submission of specimen other than nasopharyngeal swab, presence of viral mutation(s) within the areas targeted by this assay, and inadequate number of viral copies (<250 copies / mL). A negative result must be combined with clinical observations, patient history, and epidemiological information.  Fact Sheet for Patients:   StrictlyIdeas.no  Fact Sheet for Healthcare Providers: BankingDealers.co.za  This test is not yet approved or  cleared by the [place] FDA and has been authorized for detection and/or diagnosis of SARS-CoV-2 by FDA under an Emergency Use Authorization (EUA).  This EUA will remain in effect (meaning this test can be used) for the duration of the COVID-19 declaration under Section 564(b)(1) of the Act, 21 U.S.C. section 360bbb-3(b)(1), unless the authorization is terminated or revoked sooner.  Performed at [facility], [place] [address], [place], [place] [address]          Radiology Studies: US RENAL  Result Date: 09/26/2019 CLINICAL DATA:  Worsening renal function. EXAM: RENAL / URINARY TRACT ULTRASOUND COMPLETE COMPARISON:  Renal ultrasound dated February 16, 2019. FINDINGS: Right Kidney: Renal measurements: 10.4 x 4.2 x 4.3 cm = volume: 97 mL . Echogenicity within normal limits. No mass or hydronephrosis visualized. Left Kidney: Renal measurements: 11.2 x 5.0 x 4.2 cm = volume: 121 mL. Echogenicity within normal limits. No mass or hydronephrosis visualized. Bladder: Appears normal for degree of bladder distention. Other: None. IMPRESSION: Normal renal ultrasound. Electronically Signed   By: Titus Dubin M.D.   On: 09/26/2019 17:44   ECHOCARDIOGRAM LIMITED  Result Date: 09/27/2019    ECHOCARDIOGRAM LIMITED REPORT   Patient Name:   SEAN MACWILLIAMS Date of Exam: 09/27/2019 Medical Rec #:  [id number]       Height:       60.0 in Accession #:    [id number]      Weight:       154.5 lb Date of Birth:   07-Dec-1955       BSA:          1.673 m Patient Age:    22 years        BP:           120/58 mmHg Patient Gender: F               HR:           58 bpm. Exam Location:  Inpatient Procedure: Limited Echo, Limited Color Doppler and Cardiac Doppler Indications:    congestive heart failure 428.0  History:  Patient has prior history of Echocardiogram examinations, most                 recent 04/19/2019. CHF; Risk Factors:Diabetes.  Sonographer:    Johny Chess Referring Phys: [place]  1. Left ventricular ejection fraction, by estimation, is 20 to 25%. The left ventricle has severely decreased function. The left ventricle demonstrates global hypokinesis. The left ventricular internal cavity size was mildly dilated. There is mild asymmetric left ventricular hypertrophy of the inferolateral segment. Left ventricular diastolic parameters are consistent with Grade III diastolic dysfunction (restrictive). Elevated left ventricular end-diastolic pressure.  2. Right ventricular systolic function is normal. The right ventricular size is normal. There is mildly elevated pulmonary artery systolic pressure. The estimated right ventricular systolic pressure is 10.6 mmHg.  3. Left atrial size was moderately dilated.  4. The mitral valve is grossly normal. Mild mitral valve regurgitation due to LV dysfunction with leaflet tenting.  5. Tricuspid valve regurgitation is moderate.  6. The aortic valve is abnormal. AV gradient may be underestimated due to LV dysfunction.  7. The inferior vena cava is normal in size with <50% respiratory variability, suggesting right atrial pressure of 8 mmHg. FINDINGS  Left Ventricle: Left ventricular ejection fraction, by estimation, is 20 to 25%. The left ventricle has severely decreased function. The left ventricle demonstrates global hypokinesis. The left ventricular internal cavity size was mildly dilated. There is mild asymmetric left ventricular hypertrophy of the  inferolateral segment. Elevated left ventricular end-diastolic pressure. Right Ventricle: The right ventricular size is normal. No increase in right ventricular wall thickness. Right ventricular systolic function is normal. There is mildly elevated pulmonary artery systolic pressure. The tricuspid regurgitant velocity is 2.91  m/s, and with an assumed right atrial pressure of 8 mmHg, the estimated right ventricular systolic pressure is 26.9 mmHg. Left Atrium: Left atrial size was moderately dilated. Right Atrium: Right atrial size was normal in size. Mitral Valve: The mitral valve is grossly normal. Mild mitral annular calcification. Mild mitral valve regurgitation. Tricuspid Valve: The tricuspid valve is grossly normal. Tricuspid valve regurgitation is moderate. Aortic Valve: The aortic valve is abnormal. There is mild calcification of the aortic valve. Aortic valve mean gradient measures 4.0 mmHg. Pulmonic Valve: The pulmonic valve was normal in structure. Pulmonic valve regurgitation is trivial. Venous: The inferior vena cava is normal in size with less than 50% respiratory variability, suggesting right atrial pressure of 8 mmHg.  LEFT VENTRICLE PLAX 2D LVIDd:         5.60 cm LVIDs:         4.70 cm LV PW:         1.10 cm LV IVS:        0.90 cm LVOT diam:     1.90 cm LV SV:         37 LV SV Index:   22 LVOT Area:     2.84 cm  IVC IVC diam: 1.90 cm LEFT ATRIUM         Index LA diam:    4.00 cm 2.39 cm/m  AORTIC VALVE AV Mean Grad: 4.0 mmHg LVOT Vmax:    56.70 cm/s LVOT Vmean:   39.400 cm/s LVOT VTI:     0.129 m TRICUSPID VALVE TR Peak grad:   33.9 mmHg TR Vmax:        291.00 cm/s  SHUNTS Systemic VTI:  0.13 m Systemic Diam: 1.90 cm Cherlynn Kaiser MD Electronically signed by Cherlynn Kaiser MD Signature Date/Time:  09/27/2019/5:27:11 PM    Final         Scheduled Meds: . allopurinol  200 mg Oral QHS  . aspirin EC  81 mg Oral q AM  . atorvastatin  80 mg Oral QHS  . carvedilol  12.5 mg Oral BID WC  .  DULoxetine  30 mg Oral QHS  . gabapentin  100 mg Oral QHS  . heparin  5,000 Units Subcutaneous Q8H  . insulin aspart  0-5 Units Subcutaneous QHS  . insulin aspart  0-9 Units Subcutaneous TID WC  . insulin glargine  15 Units Subcutaneous QHS  . pantoprazole  20 mg Oral Daily  . primidone  50 mg Oral QHS  . sodium chloride flush  3 mL Intravenous Q12H   Continuous Infusions: . sodium chloride 250 mL (09/28/19 1242)  . furosemide 120 mg (09/28/19 1243)     LOS: 2 days     Yaakov Guthrie, MD [facility] Pager on [email or web address]  To contact the attending provider between 7A-7P or the covering provider during after hours 7P-7A, please log into the web site [email or web address] and access using universal [place] password for that web site. If you do not have the password, please call the hospital operator.  09/28/2019, 4:51 PM

## 2019-09-29 LAB — CBC
HCT: 27.2 % — ABNORMAL LOW (ref 36.0–46.0)
Hemoglobin: 8.1 g/dL — ABNORMAL LOW (ref 12.0–15.0)
MCH: 27.4 pg (ref 26.0–34.0)
MCHC: 29.8 g/dL — ABNORMAL LOW (ref 30.0–36.0)
MCV: 91.9 fL (ref 80.0–100.0)
Platelets: 248 10*3/uL (ref 150–400)
RBC: 2.96 MIL/uL — ABNORMAL LOW (ref 3.87–5.11)
RDW: 17.7 % — ABNORMAL HIGH (ref 11.5–15.5)
WBC: 4.7 10*3/uL (ref 4.0–10.5)
nRBC: 0 % (ref 0.0–0.2)

## 2019-09-29 LAB — GLUCOSE, CAPILLARY
Glucose-Capillary: 82 mg/dL (ref 70–99)
Glucose-Capillary: 78 mg/dL (ref 70–99)
Glucose-Capillary: 140 mg/dL — ABNORMAL HIGH (ref 70–99)
Glucose-Capillary: 110 mg/dL — ABNORMAL HIGH (ref 70–99)

## 2019-09-29 LAB — BASIC METABOLIC PANEL
Anion gap: 8 (ref 5–15)
BUN: 61 mg/dL — ABNORMAL HIGH (ref 8–23)
CO2: 27 mmol/L (ref 22–32)
Calcium: 8.8 mg/dL — ABNORMAL LOW (ref 8.9–10.3)
Chloride: 98 mmol/L (ref 98–111)
Creatinine, Ser: 2.33 mg/dL — ABNORMAL HIGH (ref 0.44–1.00)
GFR calc Af Amer: 25 mL/min — ABNORMAL LOW (ref >60)
GFR calc non Af Amer: 21 mL/min — ABNORMAL LOW (ref >60)
Glucose, Bld: 92 mg/dL (ref 70–99)
Potassium: 3.8 mmol/L (ref 3.5–5.1)
Sodium: 133 mmol/L — ABNORMAL LOW (ref 135–145)

## 2019-09-29 MED ORDER — HYDRALAZINE HCL 10 MG PO TABS
10.0000 mg | ORAL_TABLET | Freq: Three times a day (TID) | ORAL | Status: DC
  Administered 2019-09-29 – 2019-10-07 (×25): 10 mg via ORAL
  Filled 2019-09-29 (×25): qty 1

## 2019-09-29 NOTE — Progress Notes (Signed)
PROGRESS NOTE    Kara Hanson  RJJ:[id number] DOB: [birth year] DOA: 09/26/2019 PCP: [name], MD    Chief Complaint  Patient presents with  . Shortness of Breath  . Leg Swelling    Brief Narrative:  Kara Hanson is a 64 y.o. female with medical history significant of hypertension, hyperlipidemia, insulin-dependent diabetes mellitus, systolic congestive heart failure with ejection fraction of 40 to 45%, morbid obesity, CKD stage IIIb, chronic hypoxemic respiratory failure-on 4 L of oxygen via nasal cannula at home presented to emergency department due to worsening shortness of breath, leg edema since 1 week.  She also reported orthopnea, PND, weight gain of 6 pounds.  Reports midsternal chest pain which is constant, 8 out of 10, nonradiating, no aggravating or relieving factors.  Questionable compliance with Lasix.  She is followed by cardiology Dr. Lorel Monaco seen in April 2021. denies headache, lightheadedness, dizziness, cough, congestion, generalized weakness.  ED Course: Upon arrival to ED: Patient's vital signs stable. Afebrile with no leukocytosis.  CBC shows anemia of chronic disease.  CMP shows worsening kidney function.  BNP elevated at 1420, troponin: 125, EKG shows sinus rhythm, right axis deviation, diffuse T wave inversion similar to previous EKG.  No ST elevation or depression noted.  COVID-19 negative, sodium 131, chloride: 97, chest x-ray shows mild cardiomegaly and interstitial edema, trace right pleural effusion. Admitted for acute on chronic systolic CHF exacerbation.   Assessment & Plan:   Principal Problem:   Acute on chronic systolic CHF (congestive heart failure) (HCC) Active Problems:   Hypertension   Normochromic normocytic anemia   Type 2 diabetes mellitus with diabetic neuropathy, unspecified (HCC)   Hyperlipidemia LDL goal <70   Acute renal failure superimposed on stage 3 chronic kidney disease (HCC)   Elevated troponin   Hyponatremia    CHF exacerbation (HCC)   Cardiorenal syndrome   Acute on chronic systolic CHF: -Presented with with worsening shortness of breath, leg edema, weight gain of 6 pounds, orthopnea, PND.  Chest x-ray showed mild cardiomegaly and interstitial edema with trace right pleural effusion.  BNP elevated at 1420.  Troponin: 125. -COVID-19 negative.   -She is supposed to be on Lasix 80 mg BID at home.  -Repeat TTE showing worsening EF 20-25%. -Continue Lasix 120 mg IV 3 times daily per nephrology. Continue her home meds-aspirin, statin, Coreg - Monitor kidney function closely. -High suspicion for cardiorenal syndrome.  Cardiology following.  Appreciate help.  AKI on CKD stage IIIb: -Likely secondary to cardiorenal syndrome & dehydration secondary to diarrhea -Renal ultrasound did not show any acute findings.  Avoid nephrotoxic medication.   -Nephrology following.  On high-dose IV Lasix per nephrology. -Continue to monitor BUN/trending.  Atypical chest pain/elevated troponin:  -Likely demand ischemia.  EKG: No acute changes. -Nitro PRN -Denies having any pain at this time. -Cardiology following. Appreciate help.  Hyponatremia/hypochloremia: Likely secondary to Lasix -Monitor electrolytes.  Chronic hypoxemic respiratory failure: Secondary to underlying systolic CHF  -on 4 L of oxygen at home. -Continue same -  No wheezing noted on exam.  Chest x-ray is negative for infection  Hypertension: Stable -Continue Coreg -Continue to monitor blood pressure closely and adjust medications as needed.  Insulin-dependent diabetes mellitus: HbA1c 7.1. -Continue Lantus 15 units at bedtime.  Continue to monitor Accu-Cheks, insulin sliding scale.  Adjust dose accordingly.  Hyperlipidemia: Continue statin  Diabetic neuropathy: Continue gabapentin and Cymbalta  GERD: Continue PPI  Anemia of chronic disease: -Hemoglobin this morning 7.8. No signs of active bleeding. -Monitor H&H  closely.   Transfuse as needed.  Decreased urinary output: -Continue strict intake and output.    Diarrhea: Improving -She is complaining of some nausea and bloating but denies having any abdominal pain.  Diarrhea improving.  Gout: Cont. Allopurinol.  Renally dosed.   DVT prophylaxis: Heparin subcutaneous Code Status: Full code Family Communication: No family at bedside Disposition:  Home?  Status is: Inpatient  Remains inpatient appropriate because:Inpatient level of care appropriate due to severity of illness   Dispo: The patient is from: Home              Anticipated d/c is to: Home              Anticipated d/c date is: 3 days              Patient currently is not medically stable to d/c.       Consultants:   Cardiology  Nephrology  Procedures:  None   Antimicrobials:  None    Subjective: Remains on oxygen by nasal cannula.  Denies having any chest pain at this time.  Complaining of some nausea and bloating but denies having any vomiting or abdominal pain.  Objective: Vitals:   09/28/19 2017 09/29/19 0500 09/29/19 0505 09/29/19 0806  BP: (!) 141/72  (!) 123/55 (!) 137/58  Pulse: 61  65 62  Resp: 18  16   Temp: 98.2 F (36.8 C)  97.7 F (36.5 C)   TempSrc: Kara  Kara   SpO2: 100%  93% 100%  Weight:  69.7 kg    Height:        Intake/Output Summary (Last 24 hours) at 09/29/2019 2542 Last data filed at 09/29/2019 0930 Gross per 24 hour  Intake 784 ml  Output 1550 ml  Net -766 ml   Filed Weights   09/28/19 0300 09/28/19 0344 09/29/19 0500  Weight: 70 kg 70.1 kg 69.7 kg    Examination:  General exam: Ill-appearing female, awake, not in any acute distress Respiratory system: Decreased breath sound lower lobes, crackles, no rhonchi or wheezing Cardiovascular system: S1 & S2 heard, RRR.   Gastrointestinal system: Abdomen distended, nontender, positive bowel sounds  Central nervous system: Generalized weakness without focal deficits Extremities: Lower  extremity edema Skin: No rashes, lesions or ulcers Psychiatry: Judgement and insight appear normal. Mood & affect appropriate.     Data Reviewed: I have personally reviewed following labs and imaging studies  CBC: Recent Labs  Lab 09/26/19 1427 09/26/19 1857 09/28/19 0731 09/29/19 0323  WBC 6.5 6.2 4.5 4.7  NEUTROABS 4.7  --   --   --   HGB 8.4* 8.8* 7.8* 8.1*  HCT 28.1* 30.0* 26.5* 27.2*  MCV 90.4 90.6 91.1 91.9  PLT 288 281 232 706    Basic Metabolic Panel: Recent Labs  Lab 09/26/19 1334 09/26/19 1857 09/27/19 0612 09/28/19 0731 09/29/19 0323  NA 131*  --  135 136 133*  K 3.8  --  3.4* 4.3 3.8  CL 97*  --  100 100 98  CO2 24  --  24 25 27   GLUCOSE 219*  --  154* 71 92  BUN 58*  --  62* 61* 61*  CREATININE 2.56* 2.47* 2.59* 2.34* 2.33*  CALCIUM 8.6*  --  8.7* 8.8* 8.8*  MG 2.1  --   --  2.3  --     GFR: Estimated Creatinine Clearance: 21.3 mL/min (A) (by C-G formula based on SCr of 2.33 mg/dL (H)).  Liver Function Tests: Recent Labs  Lab 09/26/19 1334  AST 20  ALT 14  ALKPHOS 93  BILITOT 0.5  PROT 5.6*  ALBUMIN 2.8*    CBG: Recent Labs  Lab 09/28/19 0616 09/28/19 1117 09/28/19 1638 09/28/19 2120 09/29/19 0621  GLUCAP 73 95 124* 105* 82     Recent Results (from the past 240 hour(s))  SARS Coronavirus 2 by RT PCR (hospital order, performed in [facility] lab) Nasopharyngeal Nasopharyngeal Swab     Status: None   Collection Time: 09/26/19  4:22 PM   Specimen: Nasopharyngeal Swab  Result Value Ref Range Status   SARS Coronavirus 2 NEGATIVE NEGATIVE Final    Comment: (NOTE) SARS-CoV-2 target nucleic acids are NOT DETECTED.  The SARS-CoV-2 RNA is generally detectable in upper and lower respiratory specimens during the acute phase of infection. The lowest concentration of SARS-CoV-2 viral copies this assay can detect is 250 copies / mL. A negative result does not preclude SARS-CoV-2 infection and should not be used as the sole basis  for treatment or other patient management decisions.  A negative result may occur with improper specimen collection / handling, submission of specimen other than nasopharyngeal swab, presence of viral mutation(s) within the areas targeted by this assay, and inadequate number of viral copies (<250 copies / mL). A negative result must be combined with clinical observations, patient history, and epidemiological information.  Fact Sheet for Patients:   StrictlyIdeas.no  Fact Sheet for Healthcare Providers: BankingDealers.co.za  This test is not yet approved or  cleared by the [place] FDA and has been authorized for detection and/or diagnosis of SARS-CoV-2 by FDA under an Emergency Use Authorization (EUA).  This EUA will remain in effect (meaning this test can be used) for the duration of the COVID-19 declaration under Section 564(b)(1) of the Act, 21 U.S.C. section 360bbb-3(b)(1), unless the authorization is terminated or revoked sooner.  Performed at [facility], [place] [address] [place], [place] [address]          Radiology Studies: ECHOCARDIOGRAM LIMITED  Result Date: 09/27/2019    ECHOCARDIOGRAM LIMITED REPORT   Patient Name:   Kara Hanson Date of Exam: 09/27/2019 Medical Rec #:  [id number]       Height:       60.0 in Accession #:    [id number]      Weight:       154.5 lb Date of Birth:  [birth year]       BSA:          1.673 m Patient Age:    15 years        BP:           120/58 mmHg Patient Gender: F               HR:           58 bpm. Exam Location:  Inpatient Procedure: Limited Echo, Limited Color Doppler and Cardiac Doppler Indications:    congestive heart failure 428.0  History:        Patient has prior history of Echocardiogram examinations, most                 recent 04/19/2019. CHF; Risk Factors:Diabetes.  Sonographer:    Johny Chess Referring Phys: [place]  1. Left ventricular ejection  fraction, by estimation, is 20 to 25%. The left ventricle has severely decreased function. The left ventricle demonstrates global hypokinesis. The left ventricular internal cavity size was mildly dilated. There is mild  asymmetric left ventricular hypertrophy of the inferolateral segment. Left ventricular diastolic parameters are consistent with Grade III diastolic dysfunction (restrictive). Elevated left ventricular end-diastolic pressure.  2. Right ventricular systolic function is normal. The right ventricular size is normal. There is mildly elevated pulmonary artery systolic pressure. The estimated right ventricular systolic pressure is 25.0 mmHg.  3. Left atrial size was moderately dilated.  4. The mitral valve is grossly normal. Mild mitral valve regurgitation due to LV dysfunction with leaflet tenting.  5. Tricuspid valve regurgitation is moderate.  6. The aortic valve is abnormal. AV gradient may be underestimated due to LV dysfunction.  7. The inferior vena cava is normal in size with <50% respiratory variability, suggesting right atrial pressure of 8 mmHg. FINDINGS  Left Ventricle: Left ventricular ejection fraction, by estimation, is 20 to 25%. The left ventricle has severely decreased function. The left ventricle demonstrates global hypokinesis. The left ventricular internal cavity size was mildly dilated. There is mild asymmetric left ventricular hypertrophy of the inferolateral segment. Elevated left ventricular end-diastolic pressure. Right Ventricle: The right ventricular size is normal. No increase in right ventricular wall thickness. Right ventricular systolic function is normal. There is mildly elevated pulmonary artery systolic pressure. The tricuspid regurgitant velocity is 2.91  m/s, and with an assumed right atrial pressure of 8 mmHg, the estimated right ventricular systolic pressure is 53.9 mmHg. Left Atrium: Left atrial size was moderately dilated. Right Atrium: Right atrial size was normal in  size. Mitral Valve: The mitral valve is grossly normal. Mild mitral annular calcification. Mild mitral valve regurgitation. Tricuspid Valve: The tricuspid valve is grossly normal. Tricuspid valve regurgitation is moderate. Aortic Valve: The aortic valve is abnormal. There is mild calcification of the aortic valve. Aortic valve mean gradient measures 4.0 mmHg. Pulmonic Valve: The pulmonic valve was normal in structure. Pulmonic valve regurgitation is trivial. Venous: The inferior vena cava is normal in size with less than 50% respiratory variability, suggesting right atrial pressure of 8 mmHg.  LEFT VENTRICLE PLAX 2D LVIDd:         5.60 cm LVIDs:         4.70 cm LV PW:         1.10 cm LV IVS:        0.90 cm LVOT diam:     1.90 cm LV SV:         37 LV SV Index:   22 LVOT Area:     2.84 cm  IVC IVC diam: 1.90 cm LEFT ATRIUM         Index LA diam:    4.00 cm 2.39 cm/m  AORTIC VALVE AV Mean Grad: 4.0 mmHg LVOT Vmax:    56.70 cm/s LVOT Vmean:   39.400 cm/s LVOT VTI:     0.129 m TRICUSPID VALVE TR Peak grad:   33.9 mmHg TR Vmax:        291.00 cm/s  SHUNTS Systemic VTI:  0.13 m Systemic Diam: 1.90 cm Cherlynn Kaiser MD Electronically signed by Cherlynn Kaiser MD Signature Date/Time: 09/27/2019/5:27:11 PM    Final         Scheduled Meds: . allopurinol  200 mg Kara QHS  . aspirin EC  81 mg Kara q AM  . atorvastatin  80 mg Kara QHS  . carvedilol  12.5 mg Kara BID WC  . DULoxetine  30 mg Kara QHS  . gabapentin  100 mg Kara QHS  . heparin  5,000 Units Subcutaneous Q8H  . hydrALAZINE  10 mg  Kara Q8H  . insulin aspart  0-5 Units Subcutaneous QHS  . insulin aspart  0-9 Units Subcutaneous TID WC  . insulin glargine  15 Units Subcutaneous QHS  . pantoprazole  20 mg Kara Daily  . primidone  50 mg Kara QHS  . sodium chloride flush  3 mL Intravenous Q12H   Continuous Infusions: . sodium chloride 250 mL (09/28/19 1242)  . furosemide 120 mg (09/29/19 0629)     LOS: 3 days     Yaakov Guthrie, MD [facility] Pager on amion  To contact the attending provider between 7A-7P or the covering provider during after hours 7P-7A, please log into the web site [email or web address] and access using universal [place] password for that web site. If you do not have the password, please call the hospital operator.  09/29/2019, 9:38 AM

## 2019-09-29 NOTE — Progress Notes (Signed)
Pharmacist Heart Failure Core Measure Documentation  Assessment: Kara Hanson has an EF documented as 20-25% on 09/27/19 by Echo.  Rationale: Heart failure patients with left ventricular systolic dysfunction (LVSD) and an EF < 40% should be prescribed an angiotensin converting enzyme inhibitor (ACEI) or angiotensin receptor blocker (ARB) at discharge unless a contraindication is documented in the medical record.  This patient is not currently on an ACEI or ARB for HF.  This note is being placed in the record in order to provide documentation that a contraindication to the use of these agents is present for this encounter.  ACE Inhibitor or Angiotensin Receptor Blocker is contraindicated (specify all that apply)  []   ACEI allergy AND ARB allergy []   Angioedema []   Moderate or severe aortic stenosis []   Hyperkalemia []   Hypotension []   Renal artery stenosis [x]   Worsening renal function, preexisting renal disease or dysfunction   Hildred Laser, PharmD Clinical Pharmacist **Pharmacist phone directory can now be found on [email or web address] ([email or web address]).  Listed under [place].

## 2019-09-29 NOTE — Progress Notes (Addendum)
Progress Note  Patient Name: Kara Hanson Date of Encounter: 09/29/2019  Primary Cardiologist: Candee Furbish, MD  Subjective   Less short of breath overall.  Urine output has picked up some.  Stable appetite.  No chest pain.  Inpatient Medications    Scheduled Meds: . allopurinol  200 mg Oral QHS  . aspirin EC  81 mg Oral q AM  . atorvastatin  80 mg Oral QHS  . carvedilol  12.5 mg Oral BID WC  . DULoxetine  30 mg Oral QHS  . gabapentin  100 mg Oral QHS  . heparin  5,000 Units Subcutaneous Q8H  . insulin aspart  0-5 Units Subcutaneous QHS  . insulin aspart  0-9 Units Subcutaneous TID WC  . insulin glargine  15 Units Subcutaneous QHS  . pantoprazole  20 mg Oral Daily  . primidone  50 mg Oral QHS  . sodium chloride flush  3 mL Intravenous Q12H   Continuous Infusions: . sodium chloride 250 mL (09/28/19 1242)  . furosemide 120 mg (09/29/19 0629)   PRN Meds: sodium chloride, acetaminophen, loperamide, metoCLOPramide, nitroGLYCERIN, ondansetron (ZOFRAN) IV, sodium chloride flush   Vital Signs    Vitals:   09/28/19 2017 09/29/19 0500 09/29/19 0505 09/29/19 0806  BP: (!) 141/72  (!) 123/55 (!) 137/58  Pulse: 61  65 62  Resp: 18  16   Temp: 98.2 F (36.8 C)  97.7 F (36.5 C)   TempSrc: Oral  Oral   SpO2: 100%  93% 100%  Weight:  69.7 kg    Height:        Intake/Output Summary (Last 24 hours) at 09/29/2019 0900 Last data filed at 09/29/2019 0843 Gross per 24 hour  Intake 784 ml  Output 1550 ml  Net -766 ml   Filed Weights   09/28/19 0300 09/28/19 0344 09/29/19 0500  Weight: 70 kg 70.1 kg 69.7 kg    Telemetry    Sinus rhythm.  Personally reviewed.  ECG    An ECG dated 09/26/2019 was personally reviewed today and demonstrated:  Sinus rhythm with decreased R wave progression, diffuse nonspecific ST-T wave abnormalities.  Physical Exam   GEN: No acute distress.   Neck: No JVD. Cardiac:  RRR, soft systolic murmur, no gallop.  Respiratory:  Decreased breath  sounds with few scattered crackles at the bases. GI: Soft, nontender, bowel sounds present. MS:  1-2+ lower leg edema; No deformity. Neuro:  Nonfocal. Psych: Alert and oriented x 3. Normal affect.  Labs    Chemistry Recent Labs  Lab 09/26/19 1334 09/26/19 1857 09/27/19 0612 09/28/19 0731 09/29/19 0323  NA 131*  --  135 136 133*  K 3.8  --  3.4* 4.3 3.8  CL 97*  --  100 100 98  CO2 24  --  24 25 27   GLUCOSE 219*  --  154* 71 92  BUN 58*  --  62* 61* 61*  CREATININE 2.56*   < > 2.59* 2.34* 2.33*  CALCIUM 8.6*  --  8.7* 8.8* 8.8*  PROT 5.6*  --   --   --   --   ALBUMIN 2.8*  --   --   --   --   AST 20  --   --   --   --   ALT 14  --   --   --   --   ALKPHOS 93  --   --   --   --   BILITOT 0.5  --   --   --   --  GFRNONAA 19*   < > 19* 21* 21*  GFRAA 22*   < > 22* 25* 25*  ANIONGAP 10  --  11 11 8    < > = values in this interval not displayed.     Hematology Recent Labs  Lab 09/26/19 1857 09/28/19 0731 09/29/19 0323  WBC 6.2 4.5 4.7  RBC 3.31* 2.91* 2.96*  HGB 8.8* 7.8* 8.1*  HCT 30.0* 26.5* 27.2*  MCV 90.6 91.1 91.9  MCH 26.6 26.8 27.4  MCHC 29.3* 29.4* 29.8*  RDW 17.4* 17.5* 17.7*  PLT 281 232 248    Cardiac Enzymes Recent Labs  Lab 09/26/19 1334 09/26/19 1622 09/26/19 1856  TROPONINIHS 125* 123* 130*    BNP Recent Labs  Lab 09/26/19 1427  BNP 1,420.6*     Radiology    ECHOCARDIOGRAM LIMITED  Result Date: 09/27/2019    ECHOCARDIOGRAM LIMITED REPORT   Patient Name:   CHYANE GREER Date of Exam: 09/27/2019 Medical Rec #:  [id number]       Height:       60.0 in Accession #:    [id number]      Weight:       154.5 lb Date of Birth:  [birth year]       BSA:          1.673 m Patient Age:    64 years        BP:           120/58 mmHg Patient Gender: F               HR:           58 bpm. Exam Location:  Inpatient Procedure: Limited Echo, Limited Color Doppler and Cardiac Doppler Indications:    congestive heart failure 428.0  History:        Patient has  prior history of Echocardiogram examinations, most                 recent 04/19/2019. CHF; Risk Factors:Diabetes.  Sonographer:    Johny Chess Referring Phys: [place]  1. Left ventricular ejection fraction, by estimation, is 20 to 25%. The left ventricle has severely decreased function. The left ventricle demonstrates global hypokinesis. The left ventricular internal cavity size was mildly dilated. There is mild asymmetric left ventricular hypertrophy of the inferolateral segment. Left ventricular diastolic parameters are consistent with Grade III diastolic dysfunction (restrictive). Elevated left ventricular end-diastolic pressure.  2. Right ventricular systolic function is normal. The right ventricular size is normal. There is mildly elevated pulmonary artery systolic pressure. The estimated right ventricular systolic pressure is 23.5 mmHg.  3. Left atrial size was moderately dilated.  4. The mitral valve is grossly normal. Mild mitral valve regurgitation due to LV dysfunction with leaflet tenting.  5. Tricuspid valve regurgitation is moderate.  6. The aortic valve is abnormal. AV gradient may be underestimated due to LV dysfunction.  7. The inferior vena cava is normal in size with <50% respiratory variability, suggesting right atrial pressure of 8 mmHg. FINDINGS  Left Ventricle: Left ventricular ejection fraction, by estimation, is 20 to 25%. The left ventricle has severely decreased function. The left ventricle demonstrates global hypokinesis. The left ventricular internal cavity size was mildly dilated. There is mild asymmetric left ventricular hypertrophy of the inferolateral segment. Elevated left ventricular end-diastolic pressure. Right Ventricle: The right ventricular size is normal. No increase in right ventricular wall thickness. Right ventricular systolic function is normal. There is mildly elevated pulmonary  artery systolic pressure. The tricuspid regurgitant velocity is  2.91  m/s, and with an assumed right atrial pressure of 8 mmHg, the estimated right ventricular systolic pressure is 76.1 mmHg. Left Atrium: Left atrial size was moderately dilated. Right Atrium: Right atrial size was normal in size. Mitral Valve: The mitral valve is grossly normal. Mild mitral annular calcification. Mild mitral valve regurgitation. Tricuspid Valve: The tricuspid valve is grossly normal. Tricuspid valve regurgitation is moderate. Aortic Valve: The aortic valve is abnormal. There is mild calcification of the aortic valve. Aortic valve mean gradient measures 4.0 mmHg. Pulmonic Valve: The pulmonic valve was normal in structure. Pulmonic valve regurgitation is trivial. Venous: The inferior vena cava is normal in size with less than 50% respiratory variability, suggesting right atrial pressure of 8 mmHg.  LEFT VENTRICLE PLAX 2D LVIDd:         5.60 cm LVIDs:         4.70 cm LV PW:         1.10 cm LV IVS:        0.90 cm LVOT diam:     1.90 cm LV SV:         37 LV SV Index:   22 LVOT Area:     2.84 cm  IVC IVC diam: 1.90 cm LEFT ATRIUM         Index LA diam:    4.00 cm 2.39 cm/m  AORTIC VALVE AV Mean Grad: 4.0 mmHg LVOT Vmax:    56.70 cm/s LVOT Vmean:   39.400 cm/s LVOT VTI:     0.129 m TRICUSPID VALVE TR Peak grad:   33.9 mmHg TR Vmax:        291.00 cm/s  SHUNTS Systemic VTI:  0.13 m Systemic Diam: 1.90 cm Cherlynn Kaiser MD Electronically signed by Cherlynn Kaiser MD Signature Date/Time: 09/27/2019/5:27:11 PM    Final     Patient Profile     64 y.o. female with a history of hypertension, hyperlipidemia, type 2 diabetes mellitus, nonischemic cardiomyopathy with LVEF previously 40 to 45%, and CKD stage IIIb.  She is currently admitted with acute on chronic renal failure and evidence of worsening cardiomyopathy.  Question of possible cardiorenal syndrome.  Assessment & Plan    1.  Nonischemic cardiomyopathy with decline in LVEF to the range of 20 to 25% by recent assessment, restrictive diastolic  filling pattern, normal RV function with estimated RVSP 42 mmHg.  Right and left heart catheterization from November 2020 demonstrated mild nonobstructive coronary artery disease with elevated filling pressures but normal cardiac output.  SPEP unremarkable from January, recent TSH normal.  Has not had PYP scan.  2.  Acute on chronic renal failure with CKD stage IIIb at baseline.  Creatinine up to 2.59 at peak, stable a 2.33 today.  Nephrology following.  She is currently on high-dose IV Lasix and urine output is improving.  Net output of 1300 cc last 24 hours.  3.  Acute on chronic anemia, with anemia of chronic disease at baseline.  Hemoglobin relatively stable at 8.1.  No obvious bleeding.  4.  Type 2 diabetes mellitus, continues on insulin.  Recent hemoglobin A1c 7.1%.  5.  Mildly elevated high-sensitivity troponin I levels and in flat pattern, 120-130 not suggestive of ACS and more in line with heart failure in the setting of CKD.  Continue high-dose IV Lasix with improving urine output and stable creatinine.  Coreg dose was cut in half yesterday.  Continue aspirin and Lipitor, obviously would hold  off on ARB/Entresto.  Suspect that a right heart catheterization would be useful for repeat hemodynamic assessment, will discuss with advanced heart failure team.  We will start low-dose hydralazine (no nitrate with reported allergy/intolerance).  Signed, Rozann Lesches, MD  09/29/2019, 9:00 AM

## 2019-09-29 NOTE — H&P (View-Only) (Signed)
Progress Note  Patient Name: Kara Hanson Date of Encounter: 09/29/2019  Primary Cardiologist: Candee Furbish, MD  Subjective   Less short of breath overall.  Urine output has picked up some.  Stable appetite.  No chest pain.  Inpatient Medications    Scheduled Meds: . allopurinol  200 mg Oral QHS  . aspirin EC  81 mg Oral q AM  . atorvastatin  80 mg Oral QHS  . carvedilol  12.5 mg Oral BID WC  . DULoxetine  30 mg Oral QHS  . gabapentin  100 mg Oral QHS  . heparin  5,000 Units Subcutaneous Q8H  . insulin aspart  0-5 Units Subcutaneous QHS  . insulin aspart  0-9 Units Subcutaneous TID WC  . insulin glargine  15 Units Subcutaneous QHS  . pantoprazole  20 mg Oral Daily  . primidone  50 mg Oral QHS  . sodium chloride flush  3 mL Intravenous Q12H   Continuous Infusions: . sodium chloride 250 mL (09/28/19 1242)  . furosemide 120 mg (09/29/19 0629)   PRN Meds: sodium chloride, acetaminophen, loperamide, metoCLOPramide, nitroGLYCERIN, ondansetron (ZOFRAN) IV, sodium chloride flush   Vital Signs    Vitals:   09/28/19 2017 09/29/19 0500 09/29/19 0505 09/29/19 0806  BP: (!) 141/72  (!) 123/55 (!) 137/58  Pulse: 61  65 62  Resp: 18  16   Temp: 98.2 F (36.8 C)  97.7 F (36.5 C)   TempSrc: Oral  Oral   SpO2: 100%  93% 100%  Weight:  69.7 kg    Height:        Intake/Output Summary (Last 24 hours) at 09/29/2019 0900 Last data filed at 09/29/2019 0843 Gross per 24 hour  Intake 784 ml  Output 1550 ml  Net -766 ml   Filed Weights   09/28/19 0300 09/28/19 0344 09/29/19 0500  Weight: 70 kg 70.1 kg 69.7 kg    Telemetry    Sinus rhythm.  Personally reviewed.  ECG    An ECG dated 09/26/2019 was personally reviewed today and demonstrated:  Sinus rhythm with decreased R wave progression, diffuse nonspecific ST-T wave abnormalities.  Physical Exam   GEN: No acute distress.   Neck: No JVD. Cardiac:  RRR, soft systolic murmur, no gallop.  Respiratory:  Decreased breath  sounds with few scattered crackles at the bases. GI: Soft, nontender, bowel sounds present. MS:  1-2+ lower leg edema; No deformity. Neuro:  Nonfocal. Psych: Alert and oriented x 3. Normal affect.  Labs    Chemistry Recent Labs  Lab 09/26/19 1334 09/26/19 1857 09/27/19 0612 09/28/19 0731 09/29/19 0323  NA 131*  --  135 136 133*  K 3.8  --  3.4* 4.3 3.8  CL 97*  --  100 100 98  CO2 24  --  24 25 27   GLUCOSE 219*  --  154* 71 92  BUN 58*  --  62* 61* 61*  CREATININE 2.56*   < > 2.59* 2.34* 2.33*  CALCIUM 8.6*  --  8.7* 8.8* 8.8*  PROT 5.6*  --   --   --   --   ALBUMIN 2.8*  --   --   --   --   AST 20  --   --   --   --   ALT 14  --   --   --   --   ALKPHOS 93  --   --   --   --   BILITOT 0.5  --   --   --   --  GFRNONAA 19*   < > 19* 21* 21*  GFRAA 22*   < > 22* 25* 25*  ANIONGAP 10  --  11 11 8    < > = values in this interval not displayed.     Hematology Recent Labs  Lab 09/26/19 1857 09/28/19 0731 09/29/19 0323  WBC 6.2 4.5 4.7  RBC 3.31* 2.91* 2.96*  HGB 8.8* 7.8* 8.1*  HCT 30.0* 26.5* 27.2*  MCV 90.6 91.1 91.9  MCH 26.6 26.8 27.4  MCHC 29.3* 29.4* 29.8*  RDW 17.4* 17.5* 17.7*  PLT 281 232 248    Cardiac Enzymes Recent Labs  Lab 09/26/19 1334 09/26/19 1622 09/26/19 1856  TROPONINIHS 125* 123* 130*    BNP Recent Labs  Lab 09/26/19 1427  BNP 1,420.6*     Radiology    ECHOCARDIOGRAM LIMITED  Result Date: 09/27/2019    ECHOCARDIOGRAM LIMITED REPORT   Patient Name:   TRUC WINFREE Date of Exam: 09/27/2019 Medical Rec #:  [id number]       Height:       60.0 in Accession #:    [id number]      Weight:       154.5 lb Date of Birth:  June 13, 1955       BSA:          1.673 m Patient Age:    64 years        BP:           120/58 mmHg Patient Gender: F               HR:           58 bpm. Exam Location:  Inpatient Procedure: Limited Echo, Limited Color Doppler and Cardiac Doppler Indications:    congestive heart failure 428.0  History:        Patient has  prior history of Echocardiogram examinations, most                 recent 04/19/2019. CHF; Risk Factors:Diabetes.  Sonographer:    Johny Chess Referring Phys: [place]  1. Left ventricular ejection fraction, by estimation, is 20 to 25%. The left ventricle has severely decreased function. The left ventricle demonstrates global hypokinesis. The left ventricular internal cavity size was mildly dilated. There is mild asymmetric left ventricular hypertrophy of the inferolateral segment. Left ventricular diastolic parameters are consistent with Grade III diastolic dysfunction (restrictive). Elevated left ventricular end-diastolic pressure.  2. Right ventricular systolic function is normal. The right ventricular size is normal. There is mildly elevated pulmonary artery systolic pressure. The estimated right ventricular systolic pressure is 60.7 mmHg.  3. Left atrial size was moderately dilated.  4. The mitral valve is grossly normal. Mild mitral valve regurgitation due to LV dysfunction with leaflet tenting.  5. Tricuspid valve regurgitation is moderate.  6. The aortic valve is abnormal. AV gradient may be underestimated due to LV dysfunction.  7. The inferior vena cava is normal in size with <50% respiratory variability, suggesting right atrial pressure of 8 mmHg. FINDINGS  Left Ventricle: Left ventricular ejection fraction, by estimation, is 20 to 25%. The left ventricle has severely decreased function. The left ventricle demonstrates global hypokinesis. The left ventricular internal cavity size was mildly dilated. There is mild asymmetric left ventricular hypertrophy of the inferolateral segment. Elevated left ventricular end-diastolic pressure. Right Ventricle: The right ventricular size is normal. No increase in right ventricular wall thickness. Right ventricular systolic function is normal. There is mildly elevated pulmonary  artery systolic pressure. The tricuspid regurgitant velocity is  2.91  m/s, and with an assumed right atrial pressure of 8 mmHg, the estimated right ventricular systolic pressure is 50.3 mmHg. Left Atrium: Left atrial size was moderately dilated. Right Atrium: Right atrial size was normal in size. Mitral Valve: The mitral valve is grossly normal. Mild mitral annular calcification. Mild mitral valve regurgitation. Tricuspid Valve: The tricuspid valve is grossly normal. Tricuspid valve regurgitation is moderate. Aortic Valve: The aortic valve is abnormal. There is mild calcification of the aortic valve. Aortic valve mean gradient measures 4.0 mmHg. Pulmonic Valve: The pulmonic valve was normal in structure. Pulmonic valve regurgitation is trivial. Venous: The inferior vena cava is normal in size with less than 50% respiratory variability, suggesting right atrial pressure of 8 mmHg.  LEFT VENTRICLE PLAX 2D LVIDd:         5.60 cm LVIDs:         4.70 cm LV PW:         1.10 cm LV IVS:        0.90 cm LVOT diam:     1.90 cm LV SV:         37 LV SV Index:   22 LVOT Area:     2.84 cm  IVC IVC diam: 1.90 cm LEFT ATRIUM         Index LA diam:    4.00 cm 2.39 cm/m  AORTIC VALVE AV Mean Grad: 4.0 mmHg LVOT Vmax:    56.70 cm/s LVOT Vmean:   39.400 cm/s LVOT VTI:     0.129 m TRICUSPID VALVE TR Peak grad:   33.9 mmHg TR Vmax:        291.00 cm/s  SHUNTS Systemic VTI:  0.13 m Systemic Diam: 1.90 cm Cherlynn Kaiser MD Electronically signed by Cherlynn Kaiser MD Signature Date/Time: 09/27/2019/5:27:11 PM    Final     Patient Profile     64 y.o. female with a history of hypertension, hyperlipidemia, type 2 diabetes mellitus, nonischemic cardiomyopathy with LVEF previously 40 to 45%, and CKD stage IIIb.  She is currently admitted with acute on chronic renal failure and evidence of worsening cardiomyopathy.  Question of possible cardiorenal syndrome.  Assessment & Plan    1.  Nonischemic cardiomyopathy with decline in LVEF to the range of 20 to 25% by recent assessment, restrictive diastolic  filling pattern, normal RV function with estimated RVSP 42 mmHg.  Right and left heart catheterization from November 2020 demonstrated mild nonobstructive coronary artery disease with elevated filling pressures but normal cardiac output.  SPEP unremarkable from January, recent TSH normal.  Has not had PYP scan.  2.  Acute on chronic renal failure with CKD stage IIIb at baseline.  Creatinine up to 2.59 at peak, stable a 2.33 today.  Nephrology following.  She is currently on high-dose IV Lasix and urine output is improving.  Net output of 1300 cc last 24 hours.  3.  Acute on chronic anemia, with anemia of chronic disease at baseline.  Hemoglobin relatively stable at 8.1.  No obvious bleeding.  4.  Type 2 diabetes mellitus, continues on insulin.  Recent hemoglobin A1c 7.1%.  5.  Mildly elevated high-sensitivity troponin I levels and in flat pattern, 120-130 not suggestive of ACS and more in line with heart failure in the setting of CKD.  Continue high-dose IV Lasix with improving urine output and stable creatinine.  Coreg dose was cut in half yesterday.  Continue aspirin and Lipitor, obviously would hold  off on ARB/Entresto.  Suspect that a right heart catheterization would be useful for repeat hemodynamic assessment, will discuss with advanced heart failure team.  We will start low-dose hydralazine (no nitrate with reported allergy/intolerance).  Signed, Rozann Lesches, MD  09/29/2019, 9:00 AM

## 2019-09-29 NOTE — Progress Notes (Signed)
[facility] Progress Note    Assessment/ Plan:   1.  Acute on chronic systolic CHF exacerbation: cardiology consulted.  Repeat TTE showing worsening EF, 20-25%.  Trops flat.  Per notes, plan for advanced heart failure to weigh in tomorrow.  Coreg dose reduced in the setting of possible low- output state.  2.  AKI on CKD 3b: likely cardiorenal +/- hypotension.  Cr up to 2.5 from baseline of 1.9, improved slightly to 2.3 and is holding steady.  Renal US without obstruction. Continue Lasix 120 IV TID.  Weights coming down and UOP picking up   3.  Acute hypoxic RF: on O2, wean as above with vol removal  4.  DM II: per primary  5.  Dispo: pending   Subjective:    Still has some nausea and abd bloating today   Objective:   BP (!) 137/58   Pulse 62   Temp 97.7 F (36.5 C) (Oral)   Resp 16   Ht 5' (1.524 m)   Wt 69.7 kg   SpO2 100%   BMI 30.02 kg/m   Intake/Output Summary (Last 24 hours) at 09/29/2019 0830 Last data filed at 09/29/2019 3614 Gross per 24 hour  Intake 724 ml  Output 1950 ml  Net -1226 ml   Weight change: -0.272 kg  Physical Exam: GEN sleeping, arousable HEENT EOMI PERRL sclerae nonicteric NECK + JVD PULM on O2, normal WOB, bibasilar crackles CV RRR ABD soft some abd distention EXT 1+ LE edema, slightly improved NEURO AAO x 3 no asterixis SKIN no rashes  Imaging: ECHOCARDIOGRAM LIMITED  Result Date: 09/27/2019    ECHOCARDIOGRAM LIMITED REPORT   Patient Name:   Kara Hanson Date of Exam: 09/27/2019 Medical Rec #:  [id number]       Height:       60.0 in Accession #:    [id number]      Weight:       154.5 lb Date of Birth:  [birth year]       BSA:          1.673 m Patient Age:    64 years        BP:           120/58 mmHg Patient Gender: F               HR:           58 bpm. Exam Location:  Inpatient Procedure: Limited Echo, Limited Color Doppler and Cardiac Doppler Indications:    congestive heart failure 428.0  History:        Patient has  prior history of Echocardiogram examinations, most                 recent 04/19/2019. CHF; Risk Factors:Diabetes.  Sonographer:    Johny Chess Referring Phys: [place]  1. Left ventricular ejection fraction, by estimation, is 20 to 25%. The left ventricle has severely decreased function. The left ventricle demonstrates global hypokinesis. The left ventricular internal cavity size was mildly dilated. There is mild asymmetric left ventricular hypertrophy of the inferolateral segment. Left ventricular diastolic parameters are consistent with Grade III diastolic dysfunction (restrictive). Elevated left ventricular end-diastolic pressure.  2. Right ventricular systolic function is normal. The right ventricular size is normal. There is mildly elevated pulmonary artery systolic pressure. The estimated right ventricular systolic pressure is 71.2 mmHg.  3. Left atrial size was moderately dilated.  4. The mitral valve is grossly normal. Mild mitral valve  regurgitation due to LV dysfunction with leaflet tenting.  5. Tricuspid valve regurgitation is moderate.  6. The aortic valve is abnormal. AV gradient may be underestimated due to LV dysfunction.  7. The inferior vena cava is normal in size with <50% respiratory variability, suggesting right atrial pressure of 8 mmHg. FINDINGS  Left Ventricle: Left ventricular ejection fraction, by estimation, is 20 to 25%. The left ventricle has severely decreased function. The left ventricle demonstrates global hypokinesis. The left ventricular internal cavity size was mildly dilated. There is mild asymmetric left ventricular hypertrophy of the inferolateral segment. Elevated left ventricular end-diastolic pressure. Right Ventricle: The right ventricular size is normal. No increase in right ventricular wall thickness. Right ventricular systolic function is normal. There is mildly elevated pulmonary artery systolic pressure. The tricuspid regurgitant velocity is  2.91  m/s, and with an assumed right atrial pressure of 8 mmHg, the estimated right ventricular systolic pressure is 20.2 mmHg. Left Atrium: Left atrial size was moderately dilated. Right Atrium: Right atrial size was normal in size. Mitral Valve: The mitral valve is grossly normal. Mild mitral annular calcification. Mild mitral valve regurgitation. Tricuspid Valve: The tricuspid valve is grossly normal. Tricuspid valve regurgitation is moderate. Aortic Valve: The aortic valve is abnormal. There is mild calcification of the aortic valve. Aortic valve mean gradient measures 4.0 mmHg. Pulmonic Valve: The pulmonic valve was normal in structure. Pulmonic valve regurgitation is trivial. Venous: The inferior vena cava is normal in size with less than 50% respiratory variability, suggesting right atrial pressure of 8 mmHg.  LEFT VENTRICLE PLAX 2D LVIDd:         5.60 cm LVIDs:         4.70 cm LV PW:         1.10 cm LV IVS:        0.90 cm LVOT diam:     1.90 cm LV SV:         37 LV SV Index:   22 LVOT Area:     2.84 cm  IVC IVC diam: 1.90 cm LEFT ATRIUM         Index LA diam:    4.00 cm 2.39 cm/m  AORTIC VALVE AV Mean Grad: 4.0 mmHg LVOT Vmax:    56.70 cm/s LVOT Vmean:   39.400 cm/s LVOT VTI:     0.129 m TRICUSPID VALVE TR Peak grad:   33.9 mmHg TR Vmax:        291.00 cm/s  SHUNTS Systemic VTI:  0.13 m Systemic Diam: 1.90 cm Cherlynn Kaiser MD Electronically signed by Cherlynn Kaiser MD Signature Date/Time: 09/27/2019/5:27:11 PM    Final     Labs: BMET Recent Labs  Lab 09/26/19 1334 09/26/19 1857 09/27/19 0612 09/28/19 0731 09/29/19 0323  NA 131*  --  135 136 133*  K 3.8  --  3.4* 4.3 3.8  CL 97*  --  100 100 98  CO2 24  --  24 25 27   GLUCOSE 219*  --  154* 71 92  BUN 58*  --  62* 61* 61*  CREATININE 2.56* 2.47* 2.59* 2.34* 2.33*  CALCIUM 8.6*  --  8.7* 8.8* 8.8*   CBC Recent Labs  Lab 09/26/19 1427 09/26/19 1857 09/28/19 0731 09/29/19 0323  WBC 6.5 6.2 4.5 4.7  NEUTROABS 4.7  --   --   --    HGB 8.4* 8.8* 7.8* 8.1*  HCT 28.1* 30.0* 26.5* 27.2*  MCV 90.4 90.6 91.1 91.9  PLT 288 281 232 248  Medications:    . allopurinol  200 mg Oral QHS  . aspirin EC  81 mg Oral q AM  . atorvastatin  80 mg Oral QHS  . carvedilol  12.5 mg Oral BID WC  . DULoxetine  30 mg Oral QHS  . gabapentin  100 mg Oral QHS  . heparin  5,000 Units Subcutaneous Q8H  . insulin aspart  0-5 Units Subcutaneous QHS  . insulin aspart  0-9 Units Subcutaneous TID WC  . insulin glargine  15 Units Subcutaneous QHS  . pantoprazole  20 mg Oral Daily  . primidone  50 mg Oral QHS  . sodium chloride flush  3 mL Intravenous Q12H      Madelon Lips MD 09/29/2019, 8:30 AM

## 2019-09-30 ENCOUNTER — Encounter (HOSPITAL_COMMUNITY)
Admission: EM | Disposition: A | Discharge status: Home Health | Payer: Self-pay | Source: Home / Self Care | Attending: Internal Medicine

## 2019-09-30 HISTORY — PX: RIGHT HEART CATH: CATH118263

## 2019-09-30 LAB — CBC
HCT: 28.2 % — ABNORMAL LOW (ref 36.0–46.0)
Hemoglobin: 8.5 g/dL — ABNORMAL LOW (ref 12.0–15.0)
MCH: 27.3 pg (ref 26.0–34.0)
MCHC: 30.1 g/dL (ref 30.0–36.0)
MCV: 90.7 fL (ref 80.0–100.0)
Platelets: 250 10*3/uL (ref 150–400)
RBC: 3.11 MIL/uL — ABNORMAL LOW (ref 3.87–5.11)
RDW: 17.8 % — ABNORMAL HIGH (ref 11.5–15.5)
WBC: 4.4 10*3/uL (ref 4.0–10.5)
nRBC: 0 % (ref 0.0–0.2)

## 2019-09-30 LAB — BASIC METABOLIC PANEL
Anion gap: 10 (ref 5–15)
BUN: 59 mg/dL — ABNORMAL HIGH (ref 8–23)
CO2: 27 mmol/L (ref 22–32)
Calcium: 9 mg/dL (ref 8.9–10.3)
Chloride: 98 mmol/L (ref 98–111)
Creatinine, Ser: 2.01 mg/dL — ABNORMAL HIGH (ref 0.44–1.00)
GFR calc Af Amer: 30 mL/min — ABNORMAL LOW (ref >60)
GFR calc non Af Amer: 26 mL/min — ABNORMAL LOW (ref >60)
Glucose, Bld: 138 mg/dL — ABNORMAL HIGH (ref 70–99)
Potassium: 3.6 mmol/L (ref 3.5–5.1)
Sodium: 135 mmol/L (ref 135–145)

## 2019-09-30 LAB — POCT I-STAT EG7
Acid-Base Excess: 3 mmol/L — ABNORMAL HIGH (ref 0.0–2.0)
Acid-Base Excess: 5 mmol/L — ABNORMAL HIGH (ref 0.0–2.0)
Acid-Base Excess: 6 mmol/L — ABNORMAL HIGH (ref 0.0–2.0)
Bicarbonate: 28.2 mmol/L — ABNORMAL HIGH (ref 20.0–28.0)
Bicarbonate: 31.4 mmol/L — ABNORMAL HIGH (ref 20.0–28.0)
Bicarbonate: 31.6 mmol/L — ABNORMAL HIGH (ref 20.0–28.0)
Calcium, Ion: 1.1 mmol/L — ABNORMAL LOW (ref 1.15–1.40)
Calcium, Ion: 1.23 mmol/L (ref 1.15–1.40)
Calcium, Ion: 1.25 mmol/L (ref 1.15–1.40)
HCT: 25 % — ABNORMAL LOW (ref 36.0–46.0)
HCT: 26 % — ABNORMAL LOW (ref 36.0–46.0)
HCT: 27 % — ABNORMAL LOW (ref 36.0–46.0)
Hemoglobin: 8.5 g/dL — ABNORMAL LOW (ref 12.0–15.0)
Hemoglobin: 8.8 g/dL — ABNORMAL LOW (ref 12.0–15.0)
Hemoglobin: 9.2 g/dL — ABNORMAL LOW (ref 12.0–15.0)
O2 Saturation: 57 %
O2 Saturation: 53 %
O2 Saturation: 58 %
Potassium: 3.6 mmol/L (ref 3.5–5.1)
Potassium: 3.9 mmol/L (ref 3.5–5.1)
Potassium: 4 mmol/L (ref 3.5–5.1)
Sodium: 139 mmol/L (ref 135–145)
Sodium: 135 mmol/L (ref 135–145)
Sodium: 136 mmol/L (ref 135–145)
TCO2: 30 mmol/L (ref 22–32)
TCO2: 33 mmol/L — ABNORMAL HIGH (ref 22–32)
TCO2: 33 mmol/L — ABNORMAL HIGH (ref 22–32)
pCO2, Ven: 48.5 mmHg (ref 44.0–60.0)
pCO2, Ven: 53.2 mmHg (ref 44.0–60.0)
pCO2, Ven: 53.4 mmHg (ref 44.0–60.0)
pH, Ven: 7.372 (ref 7.250–7.430)
pH, Ven: 7.378 (ref 7.250–7.430)
pH, Ven: 7.379 (ref 7.250–7.430)
pO2, Ven: 31 mmHg — CL (ref 32.0–45.0)
pO2, Ven: 29 mmHg — CL (ref 32.0–45.0)
pO2, Ven: 32 mmHg (ref 32.0–45.0)

## 2019-09-30 LAB — GLUCOSE, CAPILLARY
Glucose-Capillary: 128 mg/dL — ABNORMAL HIGH (ref 70–99)
Glucose-Capillary: 106 mg/dL — ABNORMAL HIGH (ref 70–99)
Glucose-Capillary: 115 mg/dL — ABNORMAL HIGH (ref 70–99)
Glucose-Capillary: 150 mg/dL — ABNORMAL HIGH (ref 70–99)

## 2019-09-30 LAB — IRON AND TIBC
Iron: 25 ug/dL — ABNORMAL LOW (ref 28–170)
Saturation Ratios: 10 % — ABNORMAL LOW (ref 10.4–31.8)
TIBC: 253 ug/dL (ref 250–450)
UIBC: 228 ug/dL

## 2019-09-30 LAB — FERRITIN: Ferritin: 70 ng/mL (ref 11–307)

## 2019-09-30 SURGERY — RIGHT HEART CATH
Anesthesia: LOCAL

## 2019-09-30 MED ORDER — MILRINONE LACTATE IN DEXTROSE 20-5 MG/100ML-% IV SOLN
0.1250 ug/kg/min | INTRAVENOUS | Status: DC
  Administered 2019-09-30 – 2019-10-04 (×5): 0.25 ug/kg/min via INTRAVENOUS
  Filled 2019-09-30 (×6): qty 100

## 2019-09-30 MED ORDER — POTASSIUM CHLORIDE CRYS ER 20 MEQ PO TBCR
40.0000 meq | EXTENDED_RELEASE_TABLET | Freq: Once | ORAL | Status: AC
  Administered 2019-09-30: 40 meq via ORAL
  Filled 2019-09-30: qty 2

## 2019-09-30 MED ORDER — ENOXAPARIN SODIUM 40 MG/0.4ML ~~LOC~~ SOLN
40.0000 mg | SUBCUTANEOUS | Status: DC
Start: 2019-10-01 — End: 2019-09-30

## 2019-09-30 MED ORDER — SODIUM CHLORIDE 0.9% FLUSH
3.0000 mL | Freq: Two times a day (BID) | INTRAVENOUS | Status: DC
  Administered 2019-09-30 – 2019-10-07 (×10): 3 mL via INTRAVENOUS

## 2019-09-30 MED ORDER — SODIUM CHLORIDE 0.9 % IV SOLN: 250.0000 mL | INTRAVENOUS | Status: DC | PRN

## 2019-09-30 MED ORDER — SODIUM CHLORIDE 0.9 % IV SOLN: INTRAVENOUS | Status: DC

## 2019-09-30 MED ORDER — SODIUM CHLORIDE 0.9% FLUSH: 3.0000 mL | INTRAVENOUS | Status: DC | PRN

## 2019-09-30 MED ORDER — LIDOCAINE HCL (PF) 1 % IJ SOLN
INTRAMUSCULAR | Status: DC | PRN
  Administered 2019-09-30: 2 mL

## 2019-09-30 MED ORDER — METOLAZONE 5 MG PO TABS
5.0000 mg | ORAL_TABLET | Freq: Once | ORAL | Status: AC
  Administered 2019-09-30: 5 mg via ORAL
  Filled 2019-09-30: qty 1

## 2019-09-30 MED ORDER — FUROSEMIDE 10 MG/ML IJ SOLN
120.0000 mg | Freq: Two times a day (BID) | INTRAVENOUS | Status: DC
  Administered 2019-09-30 – 2019-10-03 (×6): 120 mg via INTRAVENOUS
  Filled 2019-09-30 (×3): qty 12
  Filled 2019-09-30: qty 10
  Filled 2019-09-30 (×2): qty 12
  Filled 2019-09-30: qty 10

## 2019-09-30 MED ORDER — LABETALOL HCL 5 MG/ML IV SOLN: 10.0000 mg | INTRAVENOUS | Status: AC | PRN

## 2019-09-30 MED ORDER — SODIUM CHLORIDE 0.9% FLUSH
3.0000 mL | Freq: Two times a day (BID) | INTRAVENOUS | Status: DC
  Administered 2019-10-01 – 2019-10-06 (×9): 3 mL via INTRAVENOUS

## 2019-09-30 MED ORDER — HYDRALAZINE HCL 20 MG/ML IJ SOLN: 10.0000 mg | INTRAMUSCULAR | Status: AC | PRN

## 2019-09-30 MED ORDER — ONDANSETRON HCL 4 MG/2ML IJ SOLN: 4.0000 mg | Freq: Four times a day (QID) | INTRAMUSCULAR | Status: DC | PRN

## 2019-09-30 MED ORDER — HEPARIN (PORCINE) IN NACL 1000-0.9 UT/500ML-% IV SOLN
INTRAVENOUS | Status: DC | PRN
  Administered 2019-09-30: 500 mL

## 2019-09-30 MED ORDER — LIDOCAINE HCL (PF) 1 % IJ SOLN
INTRAMUSCULAR | Status: AC
  Filled 2019-09-30: qty 30

## 2019-09-30 MED ORDER — ACETAMINOPHEN 325 MG PO TABS
650.0000 mg | ORAL_TABLET | ORAL | Status: DC | PRN
Start: 2019-09-30 — End: 2019-09-30

## 2019-09-30 MED ORDER — HEPARIN SODIUM (PORCINE) 5000 UNIT/ML IJ SOLN
5000.0000 [IU] | Freq: Three times a day (TID) | INTRAMUSCULAR | Status: DC
  Administered 2019-10-01 – 2019-10-07 (×19): 5000 [IU] via SUBCUTANEOUS
  Filled 2019-09-30 (×20): qty 1

## 2019-09-30 MED ORDER — HEPARIN (PORCINE) IN NACL 1000-0.9 UT/500ML-% IV SOLN
INTRAVENOUS | Status: AC
  Filled 2019-09-30: qty 500

## 2019-09-30 SURGICAL SUPPLY — 7 items
CATH SWAN GANZ 7F STRAIGHT (CATHETERS) ×1 IMPLANT
GLIDESHEATH SLENDER 7FR .021G (SHEATH) ×1 IMPLANT
PACK CARDIAC CATHETERIZATION (CUSTOM PROCEDURE TRAY) ×2 IMPLANT
TRANSDUCER W/STOPCOCK (MISCELLANEOUS) ×2 IMPLANT
TUBING ART PRESS 72  MALE/FEM (TUBING) ×1
TUBING ART PRESS 72 MALE/FEM (TUBING) IMPLANT
WIRE EMERALD 3MM-J .025X260CM (WIRE) ×1 IMPLANT

## 2019-09-30 NOTE — Interval H&P Note (Signed)
History and Physical Interval Note:  09/30/2019 2:57 PM  Kara Hanson  has presented today for surgery, with the diagnosis of heart failure.  The various methods of treatment have been discussed with the patient and family. After consideration of risks, benefits and other options for treatment, the patient has consented to  Procedure(s): RIGHT HEART CATH (N/A) as a surgical intervention.  The patient's history has been reviewed, patient examined, no change in status, stable for surgery.  I have reviewed the patient's chart and labs.  Questions were answered to the patient's satisfaction.     [name]

## 2019-09-30 NOTE — Progress Notes (Signed)
MRI will not be done until tomorrow morning per radiology staff.

## 2019-09-30 NOTE — Consult Note (Addendum)
Advanced Heart Failure Team Consult Note   Primary Physician: [name], MD PCP-Cardiologist:  Candee Furbish, MD  Reason for Consultation: Acute on chronic combined systolic and diastolic heart failure   HPI:    Kara Hanson is seen today for evaluation of acute on chronic systolic combined systolic and diastolic heart failure at the request of Dr. Domenic Polite, Cardiology.   64 y/o female w/ h/o HTN, HLD, IDDM, Stage 3 CKD, chronic systolic and diastolic heart failure, NICM and history of poor med compliance. She is followed by Paramedicine. Multiple admissions in the last year for a/c CHF.   In 2018, she was admitted for syncope. Echo showed reduced LVEF 30-35%. Had cardiac cath at that time that showed normal coronaries and LVEF was normal by LVG ~50-55%, by visual estimate. No etiology for syncope found. Thought maybe to have a stress induced CM. Has been followed by [facility] and has had several interval echos since that time. Repeat Echos in 11/2017 and again 10/2018 showed normal LVEF, 50-55% and G2DD.   Admitted 07/2018 for COVID PNA.   Admitted 11/6765 for a/c diastolic HF and diuresed w/ IV Lasix.   Admitted again 01/2019 for a/c CHF and echo was repeated showing mildly reduced LVEF at 40-45%. Echo repeated again 11/20 showed further reduction in LVEF down to 30-35% w/ G3DD, which led to repeat cardiac catheterization showing mild nonobstructive CAD c/w NICM. RHC showed elevated filling pressures and normal CO. She was diuresed w/ IV Lasix.   Admitted again 04/2019 for a/c CHF, diuresed w/ IV Lasix.   Now readmitted for a/c CHF w/ NYHA Class IIIb symptoms and worsening renal function. Prior SCr in March was 1.77. SCr on this admit elevated at 2.47, in the setting of volume overload and improving w/ diuresis, down to 2.01 today. Nephrology following. Renal US normal. She is on high dose diuretics, 120 mg IB TID, w/ marginal urinary output. Overall Net I/Os -2.5L. Echo  repeated and LVEF lower, 20-25%, w/ G3DD, restrictive filling and mild asymmetric left ventricular hypertrophy of the inferolateral segment. RV normal. Coreg dose has been reduced in the setting of possible low output state. AHF consulted for further recommendations.   Per H&P, she had been noncompliant w/ her home meds and had not taken meds in over 1 week PTA.    Cardiac Studies   Echo this admit  1. Left ventricular ejection fraction, by estimation, is 20 to 25%. The left ventricle has severely decreased function. The left ventricle demonstrates global hypokinesis. The left ventricular internal cavity size was mildly dilated. There is mild asymmetric left ventricular hypertrophy of the inferolateral segment. Left ventricular diastolic parameters are consistent with Grade III diastolic dysfunction (restrictive). Elevated left ventricular end-diastolic pressure. 2. Right ventricular systolic function is normal. The right ventricular size is normal. There is mildly elevated pulmonary artery systolic pressure. The estimated right ventricular systolic pressure is 20.9 mmHg. 3. Left atrial size was moderately dilated. 4. The mitral valve is grossly normal. Mild mitral valve regurgitation due to LV dysfunction with leaflet tenting. 5. Tricuspid valve regurgitation is moderate. 6. The aortic valve is abnormal. AV gradient may be underestimated due to LV dysfunction. 7. The inferior vena cava is normal in size with <50% respiratory variability, suggesting right atrial pressure of 8 mmHg.    R/LHC Conclusion 02/2019     Prox RCA lesion is 30% stenosed.  Prox Cx lesion is 40% stenosed with 60% stenosed side branch in 1st Mrg.  Mid LAD  lesion is 30% stenosed.  Dist LAD lesion is 30% stenosed.   Findings:  Ao = 131/59 (85) LV =  133/22 RA = 12 RV = 49/13 PA =  48/21 (33) PCW = 26 (v = 33) Fick cardiac output/index = 6.4/3.4 PVR =1.3 WU FA sat = 98 % PA sat = 62%, 63% RA/PCW  = 0.46  Assessment: 1. Mild non-obstructive CAD with small coronary arteries 2. Moderate to severe NICM with EF 30% by echo 3. No significant aortic valve gradient on pullback  4. Elevated filling pressures with normal cardiac output    Review of Systems: [y] = yes, _0  = no   . General: Weight gain _1 ; Weight loss _2 ; Anorexia _3 ; Fatigue _4 ; Fever _5 ; Chills _6 ; Weakness _7   . Cardiac: Chest pain/pressure _8 ; Resting SOB _9 ; Exertional SOB _10 ; Orthopnea _11 ; Pedal Edema _12 ; Palpitations _13 ; Syncope _14 ; Presyncope _15 ; Paroxysmal nocturnal dyspnea_16   . Pulmonary: Cough _17 ; Wheezing_18 ; Hemoptysis_19 ; Sputum _20 ; Snoring _21   . GI: Vomiting_22 ; Dysphagia_23 ; Melena_24 ; Hematochezia _25 ; Heartburn_26 ; Abdominal pain _27 ; Constipation _28 ; Diarrhea _29 ; BRBPR _30   . GU: Hematuria_31 ; Dysuria _32 ; Nocturia_33   . Vascular: Pain in legs with walking _34 ; Pain in feet with lying flat _35 ; Non-healing sores _36 ; Stroke _37 ; TIA _38 ; Slurred speech _39 ;  . Neuro: Headaches_40 ; Vertigo_41 ; Seizures_42 ; Paresthesias_43 ;Blurred vision _44 ; Diplopia _45 ; Vision changes _46   . Ortho/Skin: Arthritis _47 ; Joint pain _48 ; Muscle pain _49 ; Joint swelling _50 ; Back Pain _51 ; Rash _52   . Psych: Depression_53 ; Anxiety_54   . Heme: Bleeding problems _55 ; Clotting disorders _56 ; Anemia _57   . Endocrine: Diabetes _58 ; Thyroid dysfunction_59   Home Medications Prior to Admission medications   Medication Sig Start Date End Date Taking? Authorizing Provider  acetaminophen (TYLENOL) 500 MG tablet Take 500-1,000 mg by mouth every 6 (six) hours as needed for mild pain or headache.   Yes [provider]  allopurinol (ZYLOPRIM) 300 MG tablet Take 300 mg by mouth at bedtime.    Yes [provider]  aspirin EC 81 MG tablet Take 81 mg by mouth in the morning.    Yes [provider]  atorvastatin (LIPITOR) 80 MG tablet Take 80 mg by mouth at bedtime.    Yes [provider]  carvedilol (COREG) 25 MG tablet TAKE 1 TABLET (25 MG TOTAL) BY MOUTH 2 (TWO) TIMES DAILY WITH A MEAL. 09/26/19  Yes [name], MD  DULoxetine (CYMBALTA) 30 MG capsule Take 1 capsule (30 mg total) by mouth daily. Patient taking differently: Take 30 mg by mouth at bedtime.  02/06/19  Yes Dana Allan I, MD  ergocalciferol (VITAMIN D2) 1.25 MG (50000 UT) capsule Take 50,000 Units by mouth every Sunday.  06/26/19  Yes [provider]  furosemide (LASIX) 80 MG tablet TAKE 1 TABLET (80 MG TOTAL) BY MOUTH 2 (TWO) TIMES DAILY. Patient taking differently: Take 80 mg by mouth in the morning and at bedtime.  08/01/19  Yes [name], MD  gabapentin (NEURONTIN) 100 MG capsule Take 1 capsule (100 mg total) by mouth at bedtime. Patient taking differently: Take 100 mg by mouth in the morning and at bedtime.  05/22/19  Yes Vann,  [name] U, DO  insulin aspart (NOVOLOG) 100 UNIT/ML injection 0 to 12 units subcutaneously 3 times daily before meals as per sliding scale Patient taking differently: Inject 0-12 Units into the skin See admin instructions. Inject 0-12 units into the skin three times a day before meals, per sliding scale 02/27/19  Yes Newlin, Enobong, MD  Insulin Glargine (BASAGLAR KWIKPEN) 100 UNIT/ML SOPN Inject 0.15 mLs (15 Units total) into the skin at bedtime. 05/22/19  Yes Vann, [name] U, DO  lansoprazole (PREVACID) 15 MG capsule TAKE 1 CAPSULE (15 MG TOTAL) BY MOUTH DAILY. Patient taking differently: Take 15 mg by mouth daily before breakfast.  09/05/19  Yes Newlin, Enobong, MD  metoCLOPramide (REGLAN) 5 MG tablet Take 1 tablet (5 mg total) by mouth 3 (three) times daily as needed for nausea. Patient taking differently: Take 5 mg by mouth in the morning and at bedtime.  08/08/19 08/07/20 Yes [name], MD  Multiple Vitamins-Minerals (MULTIVITAMIN WITH MINERALS) tablet Take 1 tablet by mouth daily with breakfast.    Yes [provider]  OXYGEN Inhale 4 L/min into the  lungs continuous.   Yes [provider]  polyethylene glycol (MIRALAX / GLYCOLAX) 17 g packet Take 17 g by mouth daily as needed for moderate constipation. Patient taking differently: Take 17 g by mouth daily as needed for mild constipation or moderate constipation (MIX AND DRINK).  05/22/19  Yes Geradine Girt, DO  primidone (MYSOLINE) 50 MG tablet Take 1 tablet (50 mg total) by mouth in the morning and at bedtime. 08/19/19  Yes Tat, Eustace Quail, DO  sodium chloride (OCEAN) 0.65 % SOLN nasal spray Place 1 spray into both nostrils as needed for congestion.    Yes [provider]  SUPER B COMPLEX/C CAPS Take 1 capsule by mouth daily.    Yes [provider]  Accu-Chek FastClix Lancets MISC USE AS DIRECTED TO TEST BLOOD SUGAR THREE TIMES DAILY 06/10/19   [name], MD  Blood Glucose Monitoring Suppl (ACCU-CHEK GUIDE) w/Device KIT 1 each by Does not apply route 3 (three) times daily. 06/10/19   [name], MD  EASY COMFORT PEN NEEDLES 31G X 5 MM MISC USE FOUR TIMES PER DAY FOR INSULIN ADMINISTRATION Patient taking differently: 4 (four) times daily.  10/02/18   [name], MD  glucose blood (ACCU-CHEK GUIDE) test strip USE AS DIRECTED TO TEST BLOOD SUGAR THREE TIMES DAILY 06/10/19   [name], MD  Lancet Device MISC Use as instructed 3 times daily 04/26/19   [name], MD  Misc. Devices MISC Portable oxygen concentrator.  Diagnosis-chronic respiratory failure. 09/03/18   [name], MD  Misc. Devices MISC Roll[place]or with seat. Dx: Congestive Heart Failure 10/19/18   [name], MD  Misc. Devices MISC Scale; Dx - CHF 06/10/19   [name], MD  Misc. [place] [facility] bed.  Diagnosis CHF.  Lifetime use.  Weight 165 lbs. 07/10/19   [name], MD    Past Medical History: Past Medical History:  Diagnosis Date  . Acute CHF (congestive heart failure) ([place]) 09/24/2018  . Acute on chronic combined systolic and diastolic CHF (congestive heart failure)  ([place]) 11/27/2017  . Acute on chronic respiratory failure with hypoxia ([place]) 03/20/2018  . Acute renal failure superimposed on stage 3 chronic kidney disease ([place])   . Acute respiratory distress 11/27/2017  . Acute respiratory failure with hypoxia ([place]) 02/09/2019  . Acute systolic CHF (congestive heart failure) ([place]) 05/19/2019  . AKI (acute kidney injury) ([place]) 11/27/2017  .  Atelectasis   . Bulging lumbar disc 11/14/2017  . CAP (community acquired pneumonia) 10/31/2018  . Chest pain 06/17/2016  . CHF (congestive heart failure) ([place])   . Chronic diastolic CHF (congestive heart failure) ([place]) 07/20/2018  . Chronic respiratory failure ([place])   . CKD (chronic kidney disease), stage III 03/20/2018  . Controlled type 2 diabetes mellitus with hyperglycemia ([place]) 04/23/2016  . Controlled type 2 diabetes with neuropathy ([place]) 03/20/2018  . COVID-19 virus infection 07/20/2018  . CVA (cerebral vascular accident) ([place])   . Diabetes mellitus without complication ([place])   . Dyspnea   . Elevated troponin 04/18/2019  . Epigastric pain   . Gout 11/14/2017  . Hypercholesteremia   . Hyperlipidemia LDL goal <70 09/30/2016  . Hypertension   . Myocardial infarction ([place])   . Normochromic normocytic anemia 04/23/2016  . NSTEMI (non-ST elevated myocardial infarction) ([place]) 02/09/2019  . Pneumonia 11/01/2018  . Pneumonia due to COVID-19 virus 07/20/2018  . S/P thoracentesis   . SIRS (systemic inflammatory response syndrome) ([place]) 07/25/2018  . Spinal stenosis   . Stress-induced cardiomyopathy 05/19/2016  . Syncope 04/23/2016  . Type 2 diabetes mellitus with diabetic neuropathy, unspecified ([place]) 05/06/2016  . Vertigo 05/06/2016  . Vitamin D deficiency 04/02/2018    Past Surgical History: Past Surgical History:  Procedure Laterality Date  . BIOPSY  01/27/2019   Procedure: BIOPSY;  Surgeon: Otis Brace, MD;  Location: [facility];  Service: Gastroenterology;;  . BLADDER SURGERY    . CARDIAC CATHETERIZATION N/A  04/25/2016   Procedure: Left Heart Cath and Coronary Angiography;  Surgeon: Lorretta Harp, MD;  Location: [facility];  Service: Cardiovascular;  Laterality: N/A;  . CARDIAC CATHETERIZATION  02/11/2019  . CESAREAN SECTION    . CHOLECYSTECTOMY    . ESOPHAGOGASTRODUODENOSCOPY (EGD) WITH PROPOFOL N/A 01/27/2019   Procedure: ESOPHAGOGASTRODUODENOSCOPY (EGD) WITH PROPOFOL;  Surgeon: Otis Brace, MD;  Location: [facility];  Service: Gastroenterology;  Laterality: N/A;  . RIGHT/LEFT HEART CATH AND CORONARY ANGIOGRAPHY N/A 02/11/2019   Procedure: RIGHT/LEFT HEART CATH AND CORONARY ANGIOGRAPHY;  Surgeon: Jolaine Artist, MD;  Location: [place] [facility];  Service: Cardiovascular;  Laterality: N/A;  . VIDEO BRONCHOSCOPY N/A 02/01/2019   Procedure: VIDEO BRONCHOSCOPY WITHOUT FLUORO;  Surgeon: Candee Furbish, MD;  Location: [facility];  Service: Endoscopy;  Laterality: N/A;    Family History: Family History  Problem Relation Age of Onset  . Diabetes Mellitus II Father   . Stroke Father   . Healthy Mother        She is 58 years old.     Social History: Social History   Socioeconomic History  . Marital status: Widowed    Spouse name: Not on file  . Number of children: Not on file  . Years of education: Not on file  . Highest education level: Not on file  Occupational History  . Not on file  Tobacco Use  . Smoking status: Never Smoker  . Smokeless tobacco: Never Used  Vaping Use  . Vaping Use: Never used  Substance and Sexual Activity  . Alcohol use: No  . Drug use: No  . Sexual activity: Not Currently    Birth control/protection: None  Other Topics Concern  . Not on file  Social History Narrative  . Not on file   Social Determinants of Health   Financial Resource Strain:   . Difficulty of Paying Living Expenses:   Food Insecurity:   . Worried About Charity fundraiser in  the Last Year:   . [place] in the Last Year:   Transportation Needs:   .  Film/video editor (Medical):   Marland Kitchen Lack of Transportation (Non-Medical):   Physical Activity:   . Days of Exercise per Week:   . Minutes of Exercise per Session:   Stress:   . Feeling of Stress :   Social Connections:   . Frequency of Communication with Friends and Family:   . Frequency of Social Gatherings with Friends and Family:   . Attends Religious Services:   . Active Member of Clubs or Organizations:   . Attends Archivist Meetings:   Marland Kitchen Marital Status:     Allergies:  Allergies  Allergen Reactions  . Garlic Shortness Of Breath, Itching, Swelling and Other (See Comments)    "Raw garlic" = Hand itching and swelling  . Isosorbide Other (See Comments)    Sneezing and runny nose  . Latex Itching  . Morphine And Related Itching and Other (See Comments)    Headache   . Other Itching and Other (See Comments)    Reaction to newspaper ink- Headaches, also    Objective:    Vital Signs:   Temp:  [97.8 F (36.6 C)-98.7 F (37.1 C)] 98.7 F (37.1 C) (06/28 0818) Pulse Rate:  [63-68] 67 (06/28 0818) Resp:  [16-18] 18 (06/28 0818) BP: (114-140)/(51-68) 140/68 (06/28 0818) SpO2:  [95 %-100 %] 100 % (06/28 0818) Weight:  [69 kg] 69 kg (06/28 0440) Last BM Date: 09/28/19  Weight change: Filed Weights   09/28/19 0344 09/29/19 0500 09/30/19 0440  Weight: 70.1 kg 69.7 kg 69 kg    Intake/Output:   Intake/Output Summary (Last 24 hours) at 09/30/2019 1045 Last data filed at 09/30/2019 0900 Gross per 24 hour  Intake 700 ml  Output 1600 ml  Net -900 ml      Physical Exam    General:  Fatigue appearing, obese female. No resp difficulty HEENT: normal Neck: supple. JVP elevated to ear . Carotids 2+ bilat; no bruits. No lymphadenopathy or thyromegaly appreciated. Cor: PMI nondisplaced. Regular rate & rhythm. No rubs, gallops or murmurs. Lungs: clear Abdomen: obese, soft, nontender, nondistended. No hepatosplenomegaly. No bruits or masses. Good bowel  sounds. Extremities: no cyanosis, clubbing, rash, edema Neuro: alert & orientedx3, cranial nerves grossly intact. moves all 4 extremities w/o difficulty. Affect pleasant   Telemetry   NSR 70s   EKG    NSR 68 bpm w/ RAD and diffuse Twis c/w prior EKGs  Labs   Basic Metabolic Panel: Recent Labs  Lab 09/26/19 1334 09/26/19 1334 09/26/19 1857 09/27/19 0612 09/27/19 0612 09/28/19 0731 09/29/19 0323 09/30/19 0515  NA 131*  --   --  135  --  136 133* 135  K 3.8  --   --  3.4*  --  4.3 3.8 3.6  CL 97*  --   --  100  --  100 98 98  CO2 24  --   --  24  --  _0 GLUCOSE 219*  --   --  154*  --  71 92 138*  BUN 58*  --   --  62*  --  61* 61* 59*  CREATININE 2.56*   < > 2.47* 2.59*  --  2.34* 2.33* 2.01*  CALCIUM 8.6*   < >  --  8.7*   < > 8.8* 8.8* 9.0  MG 2.1  --   --   --   --  2.3  --   --    < > = values in this interval not displayed.    Liver Function Tests: Recent Labs  Lab 09/26/19 1334  AST 20  ALT 14  ALKPHOS 93  BILITOT 0.5  PROT 5.6*  ALBUMIN 2.8*   No results for input(s): LIPASE, AMYLASE in the last 168 hours. No results for input(s): AMMONIA in the last 168 hours.  CBC: Recent Labs  Lab 09/26/19 1427 09/26/19 1857 09/28/19 0731 09/29/19 0323 09/30/19 0515  WBC 6.5 6.2 4.5 4.7 4.4  NEUTROABS 4.7  --   --   --   --   HGB 8.4* 8.8* 7.8* 8.1* 8.5*  HCT 28.1* 30.0* 26.5* 27.2* 28.2*  MCV 90.4 90.6 91.1 91.9 90.7  PLT 288 281 232 248 250    Cardiac Enzymes: No results for input(s): CKTOTAL, CKMB, CKMBINDEX, TROPONINI in the last 168 hours.  BNP: BNP (last 3 results) Recent Labs    05/18/19 0926 07/01/19 1244 09/26/19 1427  BNP 875.8* 1,267.1* 1,420.6*    ProBNP (last 3 results) No results for input(s): PROBNP in the last 8760 hours.   CBG: Recent Labs  Lab 09/29/19 0621 09/29/19 1234 09/29/19 1631 09/29/19 2054 09/30/19 0607  GLUCAP 82 78 140* 110* 128*    Coagulation Studies: No results for input(s): LABPROT, INR in  the last 72 hours.   Imaging    No results found.   Medications:     Current Medications: . allopurinol  200 mg Oral QHS  . aspirin EC  81 mg Oral q AM  . atorvastatin  80 mg Oral QHS  . carvedilol  12.5 mg Oral BID WC  . DULoxetine  30 mg Oral QHS  . gabapentin  100 mg Oral QHS  . heparin  5,000 Units Subcutaneous Q8H  . hydrALAZINE  10 mg Oral Q8H  . insulin aspart  0-5 Units Subcutaneous QHS  . insulin aspart  0-9 Units Subcutaneous TID WC  . insulin glargine  15 Units Subcutaneous QHS  . metolazone  5 mg Oral Once  . pantoprazole  20 mg Oral Daily  . potassium chloride  40 mEq Oral Once  . primidone  50 mg Oral QHS  . sodium chloride flush  3 mL Intravenous Q12H     Infusions: . sodium chloride 250 mL (09/29/19 1402)  . furosemide        Assessment/Plan   1. Acute on Chronic Combined Systolic and Diastolic CHF - NICM - Echo in 2018 w/ EF 30-35% (LHC w/ normal cors and normal EF by LVG 50-55%) - Echo 11/2017 & 10/2018 LVEF 50-55% - Echo 01/2019 EF 40-45% - Echo 02/2019 EF 30-35%, G3DD. RV ok. R/LHC 11/20 w/ mild CAD and normal CO - Echo 09/2019 EF 20-25%, G3DD (restrictive), RV normal.  - h/o poor med compliance (followed by paramedicine) w/ multiple admissions for a/c CHF in the last 12 months - now w/ worsening renal function w/ SCr ~2 range and suboptimal diuresis despite high dose IV diuretics.   - NYHA Class IIIb  - Plan RHC today to further evaluate, check filling pressures and CO - Nephrology following and dosing diuretics. Continue IV Lasix 120 mg bid. 5 mg of metolazone ordered.   - may need to d/c coreg pending hemodynamics on RHC - no ARB/ ARNI/ Spiro/ Digoxin w/ CKD - continue hydralazine. Intolerant to Imdur  - consider SGLT2i  - needs to improve compliance w/ home meds. Continue paramedcine once discharged - not a  good candidate for advanced therapies given h/o poor compliance - consider PYP scan to r/o TTR amyloid (also h/o syncope, neuropathy  and spinal stenosis) - ? Viral cardiomyopathy as she did have COVID PNA 07/2018 and EF had been normal prior to this  - plan outpatient sleep study    2. Stage III CKD - Prior SCr in March 2021 was 1.77 - SCr on this admit elevated at 2.47, in the setting of volume overload and improving w/ diuresis, down to 2.01 today. - renal US negative  - suspect cardiorenal. Plan RHC today - continue to monitor w/ diuresis    3. IDDM  - on insulin and controlled - hgb A1c 7.1   4. Hypertension  - controlled on current regimen currently - can further titrate hydralazine gradually   5. Obesity  - Body mass index is 29.72 kg/m.   6. Poor Med Compliance - continue w/ paramedicine once discharged    Length of Stay: [address] Ladoris Gene  09/30/2019, 10:45 AM  Advanced Heart Failure Team Pager [phone] (M-F; [place])  Please contact [facility] for night-coverage after hours (4p -7a ) and weekends on [email or web address]  Patient seen and examined with the above-signed Advanced Practice Provider and/or Housestaff. I personally reviewed laboratory data, imaging studies and relevant notes. I independently examined the patient and formulated the important aspects of the plan. I have edited the note to reflect any of my changes or salient points. I have personally discussed the plan with the patient and/or family.  65 y/o woman with CKD 3b, DM2, HTN and systolic HF due to NICM by cath 11/20. Now admitted with worsening EF and low output symptoms. Not responding to high-dose diuretics  General:  Weak appearing. No resp difficulty HEENT: normal Neck: supple. JVP to ear Carotids 2+ bilat; no bruits. No lymphadenopathy or thryomegaly appreciated. Cor: PMI nondisplaced. Regular rate & rhythm. Soft MR Lungs: clear Abdomen: obese soft, nontender, nondistended. No hepatosplenomegaly. No bruits or masses. Good bowel sounds. Extremities: no cyanosis, clubbing, rash, 1-2+ edema Neuro: alert & orientedx3,  cranial nerves grossly intact. moves all 4 extremities w/o difficulty. Affect pleasant  RHC today as below shows markedly elevated biventricular pressures with low output. Cause of NICM and worsening LV function unclear. Will start milrinone and check cMRI and SPEP/UPEP. Concern for amyloid or HTN cardiomyopathy.   RHC results: RA = 13 RV = 63/16 PA =  62/23 (40) PCW = 27 (v=43) Fick cardiac output/index = 4.8/2.9 Thermo CO/CI = 2.3/1.4 PVR = 5.7 WU (Thermo) Ao sat = 95% PA sat = 54%, 56%  Assessment: 1. Markedly elevated biventricular filling pressures 2. Marked discrepancy between Fick and Thermo cardiac outputs; clinical picture most c/w Thermo numbers  Plan/Discussion:  Start IV milrinone. Continue high-dose IV lasix.   Plan cMRI to further evaluate cause of worsening cardiomyopathy.  Glori Bickers, MD  3:56 PM

## 2019-09-30 NOTE — Progress Notes (Signed)
[facility] Progress Note    Assessment/ Plan:   1.  Acute on chronic systolic CHF exacerbation: cardiology consulted.  Repeat TTE showing worsening EF, 20-25%.  Trops flat.  Coreg dose reduced in the setting of possible low- output state. - Per notes, plan for advanced heart failure to consult on 6/28.   - ordered strict ins/outs  2.  AKI on CKD 3b: likely cardiorenal +/- hypotension.  Cr up to 2.5 from baseline of 1.9.  Renal US without obstruction.  - Improving with supportive care/diuresis - Add metolazone 5 mg PO once today  - Continue IV lasix - adjust to 120 mg IV every 12 hours from every 8  3.  Acute hypoxic RF: on O2, wean as above with vol removal  4. normocytic anemia  - check iron stores  5.  DM II: per primary team   6.  Dispo: pending clinical improvement    Subjective:    strict ins/outs not available.  She had 600 mL and one un-quantified void charted over 6/27. States she's had metolazone before and this has helped her.  States she's on 4 liters of oxygen at home.  Has had a dose of lasix this AM already   Review of systems:  Reports still short of breath - better Nausea but is better - denies vomiting Denies chest pain    Objective:   BP 140/68   Pulse 67   Temp 97.9 F (36.6 C) (Oral)   Resp 16   Ht 5' (1.524 m)   Wt 69 kg   SpO2 100%   BMI 29.72 kg/m   Intake/Output Summary (Last 24 hours) at 09/30/2019 0849 Last data filed at 09/29/2019 1844 Gross per 24 hour  Intake 530 ml  Output 600 ml  Net -70 ml   Weight change: -0.68 kg  Physical Exam: GEN adult female in bed in NAD HEENT EOMI sclerae nonicteric PULM on 4 liters O2, unlabored, bibasilar crackles CV S1S2 no rub ABD soft some abd distention EXT 1+ LE edema NEURO AAO x 3 no asterixis Psych normal mood and affect   Imaging: No results found.  Labs: BMET Recent Labs  Lab 09/26/19 1334 09/26/19 1857 09/27/19 0612 09/28/19 0731 09/29/19 0323  09/30/19 0515  NA 131*  --  135 136 133* 135  K 3.8  --  3.4* 4.3 3.8 3.6  CL 97*  --  100 100 98 98  CO2 24  --  24 25 27 27   GLUCOSE 219*  --  154* 71 92 138*  BUN 58*  --  62* 61* 61* 59*  CREATININE 2.56* 2.47* 2.59* 2.34* 2.33* 2.01*  CALCIUM 8.6*  --  8.7* 8.8* 8.8* 9.0   CBC Recent Labs  Lab 09/26/19 1427 09/26/19 1427 09/26/19 1857 09/28/19 0731 09/29/19 0323 09/30/19 0515  WBC 6.5   < > 6.2 4.5 4.7 4.4  NEUTROABS 4.7  --   --   --   --   --   HGB 8.4*   < > 8.8* 7.8* 8.1* 8.5*  HCT 28.1*   < > 30.0* 26.5* 27.2* 28.2*  MCV 90.4   < > 90.6 91.1 91.9 90.7  PLT 288   < > 281 232 248 250   < > = values in this interval not displayed.    Medications:    . allopurinol  200 mg Oral QHS  . aspirin EC  81 mg Oral q AM  . atorvastatin  80 mg Oral QHS  .  carvedilol  12.5 mg Oral BID WC  . DULoxetine  30 mg Oral QHS  . gabapentin  100 mg Oral QHS  . heparin  5,000 Units Subcutaneous Q8H  . hydrALAZINE  10 mg Oral Q8H  . insulin aspart  0-5 Units Subcutaneous QHS  . insulin aspart  0-9 Units Subcutaneous TID WC  . insulin glargine  15 Units Subcutaneous QHS  . pantoprazole  20 mg Oral Daily  . primidone  50 mg Oral QHS  . sodium chloride flush  3 mL Intravenous Q12H   Claudia Desanctis MD 09/30/2019, 9:06 AM

## 2019-09-30 NOTE — Progress Notes (Signed)
OT Cancellation Note  Patient Details Name: Kara Hanson MRN: [id number] DOB: 1955-10-05   Cancelled Treatment:    Reason Eval/Treat Not Completed: Fatigue/lethargy limiting ability to participate;Other (comment) Attempted to see pt this AM with pt asleep upon arrival unable to arouse. Noted pt currently out of room at cath lab. Will follow as time allows for OT session.   Lanier Clam., COTA/L Acute Rehabilitation Services [phone]   Ihor Gully 09/30/2019, 3:00 PM

## 2019-09-30 NOTE — Progress Notes (Signed)
PROGRESS NOTE    Kara Hanson  FIE:[id number] DOB: 1955/10/16 DOA: 09/26/2019 PCP: [name], MD    Chief Complaint  Patient presents with  . Shortness of Breath  . Leg Swelling    Brief Narrative:  Kara Hanson is a 64 y.o. female with medical history significant of hypertension, hyperlipidemia, insulin-dependent diabetes mellitus, systolic congestive heart failure with ejection fraction of 40 to 45%, morbid obesity, CKD stage IIIb, chronic hypoxemic respiratory failure-on 4 L of oxygen via nasal cannula at home presented to emergency department due to worsening shortness of breath, leg edema since 1 week.  She also reported orthopnea, PND, weight gain of 6 pounds.  Reports midsternal chest pain which is constant, 8 out of 10, nonradiating, no aggravating or relieving factors.  Questionable compliance with Lasix.  She is followed by cardiology Dr. Lorel Monaco seen in April 2021. denies headache, lightheadedness, dizziness, cough, congestion, generalized weakness.  ED Course: Upon arrival to ED: Patient's vital signs stable. Afebrile with no leukocytosis.  CBC shows anemia of chronic disease.  CMP showed worsening kidney function.  BNP elevated at 1420, troponin: 125, EKG shows sinus rhythm, right axis deviation, diffuse T wave inversion similar to previous EKG.  No ST elevation or depression noted.  COVID-19 negative, sodium 131, chloride: 97, chest x-ray shows mild cardiomegaly and interstitial edema, trace right pleural effusion. Admitted for acute on chronic systolic CHF exacerbation.   Assessment & Plan:   Principal Problem:   Acute on chronic systolic CHF (congestive heart failure) (HCC) Active Problems:   Hypertension   Normochromic normocytic anemia   Type 2 diabetes mellitus with diabetic neuropathy, unspecified (HCC)   Hyperlipidemia LDL goal <70   Acute renal failure superimposed on stage 3 chronic kidney disease (HCC)   Elevated troponin   Hyponatremia    CHF exacerbation (HCC)   Cardiorenal syndrome   Acute on chronic systolic CHF: -Presented with with worsening shortness of breath, leg edema, weight gain of 6 pounds, orthopnea, PND.  Chest x-ray showed mild cardiomegaly and interstitial edema with trace right pleural effusion.  BNP elevated at 1420.  Troponin: 125. -COVID-19 negative.   -She is supposed to be on Lasix 80 mg BID at home.  -Repeat TTE showing worsening EF 20-25%. -Lasix dose decreased to 120 mg IV 2 times daily per nephrology. Continue aspirin, statin, Coreg (dose decreased per cardiology) - Monitor kidney function closely. -High suspicion for cardiorenal syndrome.  Repeat echo showing lower EF 20-25%. cardiology following.  Appreciate help.  AKI on CKD stage IIIb: -Likely secondary to cardiorenal syndrome & dehydration secondary to diarrhea -Renal ultrasound did not show any acute findings.  Avoid nephrotoxic medication.   -Nephrology following.  Appreciate help.  On high-dose IV Lasix per nephrology. -Continue to monitor BUN/trending.  Atypical chest pain/elevated troponin:  -Likely demand ischemia.  EKG: No acute changes. -Nitro PRN -Denies having any pain at this time. -Cardiology following. Appreciate help.  Hypokalemia/Hyponatremia/hypochloremia: Likely secondary to Lasix -Potassium replacement ordered.  Monitor electrolytes.  Chronic hypoxemic respiratory failure: Secondary to underlying systolic CHF  -on 4 L of oxygen at home. -Continue same -  No wheezing noted on exam.  Chest x-ray is negative for infection  Hypertension: Stable -Continue Coreg -Continue to monitor blood pressure closely and adjust medications as needed.  Insulin-dependent diabetes mellitus: HbA1c 7.1. -Continue Lantus 15 units at bedtime.  Continue to monitor Accu-Cheks, insulin sliding scale.  Adjust dose accordingly.  Hyperlipidemia: Continue statin  Diabetic neuropathy: Continue gabapentin and Cymbalta  GERD: Continue  PPI  Anemia of chronic disease: -Hemoglobin this morning 8.5.  -Monitor H&H closely.  Transfuse as needed.  Decreased urinary output: -Continue strict intake and output.    Diarrhea: Improving -She is complaining of some nausea and bloating but denies having any abdominal pain.  Diarrhea improving.  Gout: Cont. Allopurinol.  Renally dosed.   DVT prophylaxis: Heparin subcutaneous Code Status: Full code Family Communication: No family at bedside Disposition:  Home?  Status is: Inpatient  Remains inpatient appropriate because:Inpatient level of care appropriate due to severity of illness   Dispo: The patient is from: Home              Anticipated d/c is to: Home              Anticipated d/c date is: 3 days              Patient currently is not medically stable to d/c.   Consultants:   Cardiology  Nephrology  Procedures:  None   Antimicrobials:  None    Subjective: Denies having any major complaints at this time. Objective: Vitals:   09/29/19 1943 09/30/19 0438 09/30/19 0440 09/30/19 0818  BP: 139/61 139/60  140/68  Pulse: 65 68  67  Resp: 17 16  18   Temp: 98 F (36.7 C) 97.9 F (36.6 C)  98.7 F (37.1 C)  TempSrc: Oral Oral  Oral  SpO2: 96% 95%  100%  Weight:   69 kg   Height:        Intake/Output Summary (Last 24 hours) at 09/30/2019 1026 Last data filed at 09/30/2019 0900 Gross per 24 hour  Intake 700 ml  Output 1600 ml  Net -900 ml   Filed Weights   09/28/19 0344 09/29/19 0500 09/30/19 0440  Weight: 70.1 kg 69.7 kg 69 kg    Examination:  General exam: Ill-appearing female, awake, not in any acute distress Respiratory system: Decreased breath sound lower lobes, few crackles, no rhonchi or wheezing Cardiovascular system: S1 & S2 heard, RRR.   Gastrointestinal system: Abdomen distended, nontender, positive bowel sounds  Central nervous system: Generalized weakness without focal deficits Extremities: Lower extremity edema  improving Skin: No rashes, lesions or ulcers Psychiatry: Judgement and insight appear normal. Mood & affect appropriate.     Data Reviewed: I have personally reviewed following labs and imaging studies  CBC: Recent Labs  Lab 09/26/19 1427 09/26/19 1857 09/28/19 0731 09/29/19 0323 09/30/19 0515  WBC 6.5 6.2 4.5 4.7 4.4  NEUTROABS 4.7  --   --   --   --   HGB 8.4* 8.8* 7.8* 8.1* 8.5*  HCT 28.1* 30.0* 26.5* 27.2* 28.2*  MCV 90.4 90.6 91.1 91.9 90.7  PLT 288 281 232 248 025    Basic Metabolic Panel: Recent Labs  Lab 09/26/19 1334 09/26/19 1334 09/26/19 1857 09/27/19 0612 09/28/19 0731 09/29/19 0323 09/30/19 0515  NA 131*  --   --  135 136 133* 135  K 3.8  --   --  3.4* 4.3 3.8 3.6  CL 97*  --   --  100 100 98 98  CO2 24  --   --  24 25 27 27   GLUCOSE 219*  --   --  154* 71 92 138*  BUN 58*  --   --  62* 61* 61* 59*  CREATININE 2.56*   < > 2.47* 2.59* 2.34* 2.33* 2.01*  CALCIUM 8.6*  --   --  8.7* 8.8* 8.8* 9.0  MG 2.1  --   --   --  2.3  --   --    < > = values in this interval not displayed.    GFR: Estimated Creatinine Clearance: 24.5 mL/min (A) (by C-G formula based on SCr of 2.01 mg/dL (H)).  Liver Function Tests: Recent Labs  Lab 09/26/19 1334  AST 20  ALT 14  ALKPHOS 93  BILITOT 0.5  PROT 5.6*  ALBUMIN 2.8*    CBG: Recent Labs  Lab 09/29/19 0621 09/29/19 1234 09/29/19 1631 09/29/19 2054 09/30/19 0607  GLUCAP 82 78 140* 110* 128*     Recent Results (from the past 240 hour(s))  SARS Coronavirus 2 by RT PCR (hospital order, performed in [facility] hospital lab) Nasopharyngeal Nasopharyngeal Swab     Status: None   Collection Time: 09/26/19  4:22 PM   Specimen: Nasopharyngeal Swab  Result Value Ref Range Status   SARS Coronavirus 2 NEGATIVE NEGATIVE Final    Comment: (NOTE) SARS-CoV-2 target nucleic acids are NOT DETECTED.  The SARS-CoV-2 RNA is generally detectable in upper and lower respiratory specimens during the acute phase of  infection. The lowest concentration of SARS-CoV-2 viral copies this assay can detect is 250 copies / mL. A negative result does not preclude SARS-CoV-2 infection and should not be used as the sole basis for treatment or other patient management decisions.  A negative result may occur with improper specimen collection / handling, submission of specimen other than nasopharyngeal swab, presence of viral mutation(s) within the areas targeted by this assay, and inadequate number of viral copies (<250 copies / mL). A negative result must be combined with clinical observations, patient history, and epidemiological information.  Fact Sheet for Patients:   StrictlyIdeas.no  Fact Sheet for Healthcare Providers: BankingDealers.co.za  This test is not yet approved or  cleared by the [place] FDA and has been authorized for detection and/or diagnosis of SARS-CoV-2 by FDA under an Emergency Use Authorization (EUA).  This EUA will remain in effect (meaning this test can be used) for the duration of the COVID-19 declaration under Section 564(b)(1) of the Act, 21 U.S.C. section 360bbb-3(b)(1), unless the authorization is terminated or revoked sooner.  Performed at [facility], [place] [address] [place],  [address]          Radiology Studies: No results found.      Scheduled Meds: . allopurinol  200 mg Oral QHS  . aspirin EC  81 mg Oral q AM  . atorvastatin  80 mg Oral QHS  . carvedilol  12.5 mg Oral BID WC  . DULoxetine  30 mg Oral QHS  . gabapentin  100 mg Oral QHS  . heparin  5,000 Units Subcutaneous Q8H  . hydrALAZINE  10 mg Oral Q8H  . insulin aspart  0-5 Units Subcutaneous QHS  . insulin aspart  0-9 Units Subcutaneous TID WC  . insulin glargine  15 Units Subcutaneous QHS  . metolazone  5 mg Oral Once  . pantoprazole  20 mg Oral Daily  . potassium chloride  40 mEq Oral Once  . primidone  50 mg Oral QHS  .  sodium chloride flush  3 mL Intravenous Q12H   Continuous Infusions: . sodium chloride 250 mL (09/29/19 1402)  . furosemide       LOS: 4 days     Yaakov Guthrie, MD [facility] Pager on amion  To contact the attending provider between 7A-7P or the covering provider during after hours 7P-7A, please log into the web site [email or web address] and access using universal  [place] password for that web site. If you do not have the password, please call the hospital operator.  09/30/2019, 10:26 AM

## 2019-09-30 NOTE — Progress Notes (Signed)
Patient has Milrinone ordered. Verified with Rosita Fire, PA is it ok to infuse by using PIV. Per PA ok to start Milrinone, no need for PICC for now.

## 2019-10-01 ENCOUNTER — Inpatient Hospital Stay (HOSPITAL_COMMUNITY): Payer: Medicare Other

## 2019-10-01 ENCOUNTER — Encounter (HOSPITAL_COMMUNITY): Payer: Self-pay | Admitting: Internal Medicine

## 2019-10-01 DIAGNOSIS — I5043 Acute on chronic combined systolic (congestive) and diastolic (congestive) heart failure: Secondary | ICD-10-CM

## 2019-10-01 LAB — BASIC METABOLIC PANEL
Anion gap: 8 (ref 5–15)
BUN: 55 mg/dL — ABNORMAL HIGH (ref 8–23)
CO2: 29 mmol/L (ref 22–32)
Calcium: 9.1 mg/dL (ref 8.9–10.3)
Chloride: 97 mmol/L — ABNORMAL LOW (ref 98–111)
Creatinine, Ser: 1.79 mg/dL — ABNORMAL HIGH (ref 0.44–1.00)
GFR calc Af Amer: 34 mL/min — ABNORMAL LOW (ref >60)
GFR calc non Af Amer: 29 mL/min — ABNORMAL LOW (ref >60)
Glucose, Bld: 77 mg/dL (ref 70–99)
Potassium: 3.8 mmol/L (ref 3.5–5.1)
Sodium: 134 mmol/L — ABNORMAL LOW (ref 135–145)

## 2019-10-01 LAB — GLUCOSE, CAPILLARY
Glucose-Capillary: 71 mg/dL (ref 70–99)
Glucose-Capillary: 98 mg/dL (ref 70–99)
Glucose-Capillary: 114 mg/dL — ABNORMAL HIGH (ref 70–99)
Glucose-Capillary: 65 mg/dL — ABNORMAL LOW (ref 70–99)
Glucose-Capillary: 89 mg/dL (ref 70–99)
Glucose-Capillary: 144 mg/dL — ABNORMAL HIGH (ref 70–99)

## 2019-10-01 LAB — CBC
HCT: 27.7 % — ABNORMAL LOW (ref 36.0–46.0)
Hemoglobin: 8.3 g/dL — ABNORMAL LOW (ref 12.0–15.0)
MCH: 26.9 pg (ref 26.0–34.0)
MCHC: 30 g/dL (ref 30.0–36.0)
MCV: 89.6 fL (ref 80.0–100.0)
Platelets: 252 10*3/uL (ref 150–400)
RBC: 3.09 MIL/uL — ABNORMAL LOW (ref 3.87–5.11)
RDW: 17.4 % — ABNORMAL HIGH (ref 11.5–15.5)
WBC: 4.2 10*3/uL (ref 4.0–10.5)
nRBC: 0 % (ref 0.0–0.2)

## 2019-10-01 LAB — MAGNESIUM: Magnesium: 2 mg/dL (ref 1.7–2.4)

## 2019-10-01 MED ORDER — INSULIN GLARGINE 100 UNIT/ML ~~LOC~~ SOLN
12.0000 [IU] | Freq: Every day | SUBCUTANEOUS | Status: DC
  Administered 2019-10-01 – 2019-10-02 (×2): 12 [IU] via SUBCUTANEOUS
  Filled 2019-10-01 (×3): qty 0.12

## 2019-10-01 MED ORDER — GADOBUTROL 1 MMOL/ML IV SOLN
10.0000 mL | Freq: Once | INTRAVENOUS | Status: AC | PRN
  Administered 2019-10-01: 10 mL via INTRAVENOUS

## 2019-10-01 MED ORDER — SODIUM CHLORIDE 0.9 % IV SOLN
510.0000 mg | Freq: Once | INTRAVENOUS | Status: AC
  Administered 2019-10-01: 510 mg via INTRAVENOUS
  Filled 2019-10-01: qty 17

## 2019-10-01 MED ORDER — POTASSIUM CHLORIDE CRYS ER 20 MEQ PO TBCR
40.0000 meq | EXTENDED_RELEASE_TABLET | Freq: Once | ORAL | Status: AC
  Administered 2019-10-01: 40 meq via ORAL
  Filled 2019-10-01: qty 2

## 2019-10-01 NOTE — Progress Notes (Signed)
Pt sleeping. No signs of distress noted. Pt stable.

## 2019-10-01 NOTE — Progress Notes (Signed)
Pt sleeping. Pt arousable to voice. No signs of distress noted. NO needs or concerns at this time. Will continue to monitor.

## 2019-10-01 NOTE — Progress Notes (Signed)
Inpatient Diabetes Program Recommendations  AACE/ADA: New Consensus Statement on Inpatient Glycemic Control (2015)  Target Ranges:  Prepandial:   less than 140 mg/dL      Peak postprandial:   less than 180 mg/dL (1-2 hours)      Critically ill patients:  140 - 180 mg/dL   Lab Results  Component Value Date   GLUCAP 114 (H) 10/01/2019   HGBA1C 7.1 (H) 09/26/2019    Review of Glycemic Control Results for Kara Hanson, Kara Hanson (MRN [id number]) as of 10/01/2019 14:31  Ref. Range 09/30/2019 21:22 10/01/2019 06:16 10/01/2019 07:43 10/01/2019 12:31 10/01/2019 13:19  Glucose-Capillary Latest Ref Range: 70 - 99 mg/dL 150 (H) 71 98 65 (L) 114 (H)    Diabetes history: DM2  Outpatient Diabetes medications:  Basaglr 15 units qhs Novolog 0-12 units tid  Current orders for Inpatient glycemic control:  Novolog 0-9 units tid  Novolog 0-5 units qhs  Lantus 15 units qhs  Inpatient Diabetes Program Recommendations:     Lantus looks like it was double charted last evening.    Blood sugars are running on the low end.  Please consider decreasing Lantus to 12 units qhs.  Will continue to follow while inpatient.  Thank you, Reche Dixon, RN, BSN Diabetes Coordinator Inpatient Diabetes Program [phone] (team pager from 8a-5p)

## 2019-10-01 NOTE — Progress Notes (Addendum)
Advanced Heart Failure Rounding Note  PCP-Cardiologist: Candee Furbish, MD   Subjective:    Yesterday started on IV milrinone and continued on high dose IV lasix. Improved diuresis noted.   Creatinine trending down 2> 1.8.   Complaining of cough.    RHC  RA = 13 RV = 63/16 PA =  62/23 (40) PCW = 27 (v=43) Fick cardiac output/index = 4.8/2.9 Thermo CO/CI = 2.3/1.4 PVR = 5.7 WU (Thermo) Ao sat = 95% PA sat = 54%, 56% Assessment: 1. Markedly elevated biventricular filling pressures 2. Marked discrepancy between Fick and Thermo cardiac outputs; clinical picture most c/w Thermo numbers   Objective:   Weight Range: 68.3 kg Body mass index is 29.41 kg/m.   Vital Signs:   Temp:  [97.6 F (36.4 C)-98.4 F (36.9 C)] 98.3 F (36.8 C) (06/29 0516) Pulse Rate:  [66-73] 73 (06/29 0516) Resp:  [15-18] 16 (06/29 0516) BP: (128-155)/(56-69) 128/56 (06/29 0516) SpO2:  [100 %] 100 % (06/29 0516) Weight:  [68.3 kg] 68.3 kg (06/29 0516) Last BM Date: 09/28/19  Weight change: Filed Weights   09/29/19 0500 09/30/19 0440 10/01/19 0516  Weight: 69.7 kg 69 kg 68.3 kg    Intake/Output:   Intake/Output Summary (Last 24 hours) at 10/01/2019 0834 Last data filed at 10/01/2019 1610 Gross per 24 hour  Intake 753.61 ml  Output 3250 ml  Net -2496.39 ml      Physical Exam    General:  Appears chronically ill. No resp difficulty HEENT: Normal Neck: Supple. JVP 11-12 . Carotids 2+ bilat; no bruits. No lymphadenopathy or thyromegaly appreciated. Cor: PMI nondisplaced. Regular rate & rhythm. No rubs, gallops or murmurs. Lungs: Rhonchi on 4 ltiers  Abdomen: Soft, nontender, nondistended. No hepatosplenomegaly. No bruits or masses. Good bowel sounds. Extremities: No cyanosis, clubbing, rash, edema Neuro: Alert & orientedx3, cranial nerves grossly intact. moves all 4 extremities w/o difficulty. Affect pleasant   Telemetry  SR 70-80s Personally reviewed    EKG    N/a   Labs     CBC Recent Labs    09/30/19 0515 09/30/19 1514 09/30/19 1520 10/01/19 0507  WBC 4.4  --   --  4.2  HGB 8.5*   < > 9.2* 8.3*  HCT 28.2*   < > 27.0* 27.7*  MCV 90.7  --   --  89.6  PLT 250  --   --  252   < > = values in this interval not displayed.   Basic Metabolic Panel Recent Labs    09/30/19 0515 09/30/19 1514 09/30/19 1520 10/01/19 0507  NA 135   < > 136 134*  K 3.6   < > 4.0 3.8  CL 98  --   --  97*  CO2 27  --   --  29  GLUCOSE 138*  --   --  77  BUN 59*  --   --  55*  CREATININE 2.01*  --   --  1.79*  CALCIUM 9.0  --   --  9.1  MG  --   --   --  2.0   < > = values in this interval not displayed.   Liver Function Tests No results for input(s): AST, ALT, ALKPHOS, BILITOT, PROT, ALBUMIN in the last 72 hours. No results for input(s): LIPASE, AMYLASE in the last 72 hours. Cardiac Enzymes No results for input(s): CKTOTAL, CKMB, CKMBINDEX, TROPONINI in the last 72 hours.  BNP: BNP (last 3 results) Recent Labs  05/18/19 0926 07/01/19 1244 09/26/19 1427  BNP 875.8* 1,267.1* 1,420.6*    ProBNP (last 3 results) No results for input(s): PROBNP in the last 8760 hours.   D-Dimer No results for input(s): DDIMER in the last 72 hours. Hemoglobin A1C No results for input(s): HGBA1C in the last 72 hours. Fasting Lipid Panel No results for input(s): CHOL, HDL, LDLCALC, TRIG, CHOLHDL, LDLDIRECT in the last 72 hours. Thyroid Function Tests No results for input(s): TSH, T4TOTAL, T3FREE, THYROIDAB in the last 72 hours.  Invalid input(s): FREET3  Other results:   Imaging    CARDIAC CATHETERIZATION  Result Date: 09/30/2019 Findings: RA = 13 RV = 63/16 PA =  62/23 (40) PCW = 27 (v=43) Fick cardiac output/index = 4.8/2.9 Thermo CO/CI = 2.3/1.4 PVR = 5.7 WU (Thermo) Ao sat = 95% PA sat = 54%, 56% Assessment: 1. Markedly elevated biventricular filling pressures 2. Marked discrepancy between Fick and Thermo cardiac outputs; clinical picture most c/w Thermo  numbers Plan/Discussion: Start IV milrinone. Continue high-dose IV lasix. Plan cMRI to further evaluate cause of worsening cardiomyopathy. Glori Bickers, MD 3:33 PM     Medications:     Scheduled Medications: . allopurinol  200 mg Oral QHS  . aspirin EC  81 mg Oral q AM  . atorvastatin  80 mg Oral QHS  . DULoxetine  30 mg Oral QHS  . gabapentin  100 mg Oral QHS  . heparin  5,000 Units Subcutaneous Q8H  . hydrALAZINE  10 mg Oral Q8H  . insulin aspart  0-5 Units Subcutaneous QHS  . insulin aspart  0-9 Units Subcutaneous TID WC  . insulin glargine  15 Units Subcutaneous QHS  . pantoprazole  20 mg Oral Daily  . primidone  50 mg Oral QHS  . sodium chloride flush  3 mL Intravenous Q12H  . sodium chloride flush  3 mL Intravenous Q12H  . sodium chloride flush  3 mL Intravenous Q12H     Infusions: . sodium chloride 250 mL (09/30/19 1625)  . sodium chloride    . furosemide 120 mg (10/01/19 0516)  . milrinone 0.25 mcg/kg/min (09/30/19 1751)     PRN Medications:  sodium chloride, sodium chloride, acetaminophen, metoCLOPramide, nitroGLYCERIN, ondansetron (ZOFRAN) IV, sodium chloride flush, sodium chloride flush    Patient Profile   64 y/o female w/ h/o HTN, HLD, IDDM, Stage 3 CKD, chronic systolic and diastolic heart failure, NICM and history of poor med compliance. She is followed by Paramedicine. Multiple admissions in the last year for a/c CHF  Assessment/Plan   1. Acute on Chronic Combined Systolic and Diastolic CHF - NICM - Echo in 2018 w/ EF 30-35% (LHC w/ normal cors and normal EF by LVG 50-55%) - Echo 11/2017 & 10/2018 LVEF 50-55% - Echo 01/2019 EF 40-45% - Echo 02/2019 EF 30-35%, G3DD. RV ok. R/LHC 11/20 w/ mild CAD and normal CO - Echo 09/2019 EF 20-25%, G3DD (restrictive), RV normal.  - h/o poor med compliance (followed by paramedicine) w/ multiple admissions for a/c CHF in the last 12 months - Viral cardiomyopathy as she did have COVID PNA 07/2018 and EF had been  normal prior to this  - plan outpatient sleep study  - RHC with elevated filling pressures and low output HF.  - Milrinone added post cath. Improved diuresis noted.  - Nephrology following and dosing diuretics. Continue IV Lasix 120 mg bid and  5 mg of metolazone ordered.   - no ARB/ ARNI/ Spiro/ Digoxin w/ CKD - continue hydralazine. Intolerant  to Imdur  - consider SGLT2i  - needs to improve compliance w/ home meds. Continue paramedcine once discharged - not a good candidate for advanced therapies given h/o poor compliance - consider PYP scan to r/o TTR amyloid (also h/o syncope, neuropathy and spinal stenosis) - Myeloma Panel pending.   2. Stage III CKD - Prior SCr in March 2021 was 1.77 - SCr on this admit elevated at 2.47, in the setting of volume overload and improving w/ diuresis, down to 1.8  today. - renal US negative   3. IDDM  - on insulin and controlled - hgb A1c 7.1   4. Hypertension  - Improved. Continue current regimen.   5. Obesity  - Body mass index is 29.72 kg/m.   6. Poor Med Compliance - continue w/ paramedicine once discharged   7. Chronic Hypoxic Respiratory Failure On 4 liters prior to admit. Sats stable.   8. Anemia  Hgb 9.2>8.3. No obvious source.  Check anemia panel.    Length of Stay: [place], NP  10/01/2019, 8:34 AM  Advanced Heart Failure Team Pager [phone] (M-F; 7a - 4p)  Please contact [facility] for night-coverage after hours (4p -7a ) and weekends on [email or web address]  Patient seen and examined with the above-signed Advanced Practice Provider and/or Housestaff. I personally reviewed laboratory data, imaging studies and relevant notes. I independently examined the patient and formulated the important aspects of the plan. I have edited the note to reflect any of my changes or salient points. I have personally discussed the plan with the patient and/or family.  Cath results reviewed with her. Severe biventricular failure. Started  on milrinone. Diuresis and renal function improving but still overloaded. cMRI and myeloma panel pending  General:  Weak appearing. No resp difficulty HEENT: normal Neck: supple. jvp to jaw . Carotids 2+ bilat; no bruits. No lymphadenopathy or thryomegaly appreciated. Cor: PMI nondisplaced. Regular rate & rhythm. No rubs, gallops or murmurs. Lungs: coarse  Abdomen: soft, nontender, nondistended. No hepatosplenomegaly. No bruits or masses. Good bowel sounds. Extremities: no cyanosis, clubbing, rash, 1+ edema Neuro: alert & orientedx3, cranial nerves grossly intact. moves all 4 extremities w/o difficulty. Affect pleasant   She has severe biventricular HF due to NICM. Etiology remains unclear. Admitted with low output and volume overload -> AKI. Now responding to inotropic support. Continue diuresis. Await results on cMRI.  Prognosis very concerning.   Glori Bickers, MD  1:13 PM

## 2019-10-01 NOTE — Progress Notes (Signed)
PROGRESS NOTE    Kara Hanson  GYK:[id number] DOB: 03/28/1956 DOA: 09/26/2019 PCP: [name], MD    Chief Complaint  Patient presents with  . Shortness of Breath  . Leg Swelling    Brief Narrative:  Kara Hanson is a 64 y.o. female with medical history significant of hypertension, hyperlipidemia, insulin-dependent diabetes mellitus, systolic congestive heart failure with ejection fraction of 40 to 45%, morbid obesity, CKD stage IIIb, chronic hypoxemic respiratory failure-on 4 L of oxygen via nasal cannula at home presented to emergency department due to worsening shortness of breath, leg edema since 1 week.  She also reported orthopnea, PND, weight gain of 6 pounds.  Reported midsternal chest pain which is constant, 8 out of 10, nonradiating, no aggravating or relieving factors.  Questionable compliance with Lasix.  She is followed by cardiology Dr. Lorel Monaco seen in April 2021.   ED Course: Upon arrival to ED: vital signs stable. Afebrile with no leukocytosis.  CBC shows anemia of chronic disease.  CMP showed worsening kidney function.  BNP elevated at 1420, troponin: 125, EKG shows sinus rhythm, right axis deviation, diffuse T wave inversion similar to previous EKG.  No ST elevation or depression noted.  COVID-19 negative, sodium 131, chloride: 97, chest x-ray shows mild cardiomegaly and interstitial edema, trace right pleural effusion. Admitted for acute on chronic systolic CHF exacerbation.   Assessment & Plan:   Principal Problem:   Acute on chronic systolic CHF (congestive heart failure) (HCC) Active Problems:   Hypertension   Normochromic normocytic anemia   Type 2 diabetes mellitus with diabetic neuropathy, unspecified (HCC)   Hyperlipidemia LDL goal <70   Acute renal failure superimposed on stage 3 chronic kidney disease (HCC)   Elevated troponin   Hyponatremia   CHF exacerbation (HCC)   Cardiorenal syndrome   Acute on chronic combined systolic and  diastolic CHF: -Presented with with worsening shortness of breath, leg edema, weight gain of 6 pounds, orthopnea, PND.  Chest x-ray showed mild cardiomegaly and interstitial edema with trace right pleural effusion.  BNP elevated at 1420.  Troponin: 125. -COVID-19 negative.   -She is supposed to be on Lasix 80 mg BID at home.  -Repeat TTE showing worsening EF 20-25%. -Lasix dose 120 mg IV 2 times daily per nephrology. Continue aspirin, statin, Coreg (dose decreased per cardiology) - Monitor kidney function closely. -High suspicion for cardiorenal syndrome.  Repeat echo showing lower EF 20-25%. cardiology following.  Suspect viral cardiomyopathy because of history of Covid pneumonia April 2020 and EF was apparently normal prior to this.  She is status post cath and milrinone added per cardiology. Appreciate help. -Per cardiology PYP scan to r/o TTR amyloid.  AKI on CKD stage IIIb: -Likely secondary to cardiorenal syndrome? -Renal ultrasound did not show any acute findings.  Avoid nephrotoxic medication.   -Nephrology following.  Appreciate help.  On high-dose IV Lasix per nephrology. -Continue to monitor BUN/trending.  Atypical chest pain/elevated troponin:  -Likely demand ischemia.  EKG: No acute changes. -Nitro PRN -Denies having any pain at this time. -Cardiology following. Appreciate help.  Hypokalemia/Hyponatremia/hypochloremia: Likely secondary to Lasix -Potassium replaced.  Monitor electrolytes.  Chronic hypoxemic respiratory failure: Secondary to underlying systolic CHF  -on 4 L of oxygen at home. -Continue same -  No wheezing noted on exam.  Chest x-ray is negative for infection  Hypertension: Stable -Continue Coreg, hydralazine. -Continue to monitor blood pressure closely and adjust medications as needed.  Insulin-dependent diabetes mellitus: HbA1c 7.1. -Continue Lantus 15 units at bedtime.  Continue to monitor Accu-Cheks, insulin sliding scale.  Adjust dose  accordingly.  Hyperlipidemia: Continue statin  Diabetic neuropathy: Continue gabapentin and Cymbalta  GERD: Continue PPI  Anemia of chronic disease: -Hemoglobin this morning 8.3. Anemia panel ordered. -Monitor H&H closely.  Transfuse as needed.  Decreased urinary output: -Continue strict intake and output.    Diarrhea: Improving -Denies having any abdominal pain.  Diarrhea improving.  Gout: Cont. Allopurinol.  Renally dosed.   DVT prophylaxis: Heparin subcutaneous Code Status: Full code Family Communication: No family at bedside Disposition:  Home?  Status is: Inpatient  Remains inpatient appropriate because:Inpatient level of care appropriate due to severity of illness   Dispo: The patient is from: Home              Anticipated d/c is to: Home              Anticipated d/c date is: 3 days              Patient currently is not medically stable to d/c.   Consultants:   Cardiology  Nephrology  Procedures:  None   Antimicrobials:  None    Subjective: She denies having any chest pain at this time.  Shortness of breath improving per patient.  Remains on oxygen by nasal cannula.  She is going for cardiac MRI today. Objective: Vitals:   09/30/19 1539 09/30/19 1740 09/30/19 2011 10/01/19 0516  BP: (!) 155/65 (!) 145/69 (!) 129/57 (!) 128/56  Pulse: 67 66 71 73  Resp: 18  15 16   Temp: 98.4 F (36.9 C)  97.6 F (36.4 C) 98.3 F (36.8 C)  TempSrc: Oral  Oral Oral  SpO2: 100% 100%  100%  Weight:    68.3 kg  Height:        Intake/Output Summary (Last 24 hours) at 10/01/2019 0914 Last data filed at 10/01/2019 9381 Gross per 24 hour  Intake 583.61 ml  Output 2250 ml  Net -1666.39 ml   Filed Weights   09/29/19 0500 09/30/19 0440 10/01/19 0516  Weight: 69.7 kg 69 kg 68.3 kg    Examination:  General exam: Ill-appearing female, awake, not in any acute distress Respiratory system: Decreased breath sound lower lobes, rhonchi, few crackles, no  wheezing Cardiovascular system: S1 & S2 heard, RRR.   Gastrointestinal system: Abdomen distended, nontender, positive bowel sounds  Central nervous system: Grossly nonfocal Extremities: Lower extremity edema improving Skin: No rashes, lesions or ulcers Psychiatry: Judgement and insight appear normal. Mood & affect appropriate.     Data Reviewed: I have personally reviewed following labs and imaging studies  CBC: Recent Labs  Lab 09/26/19 1427 09/26/19 1427 09/26/19 1857 09/26/19 1857 09/28/19 0731 09/28/19 0731 09/29/19 0323 09/30/19 0515 09/30/19 1514 09/30/19 1520 10/01/19 0507  WBC 6.5   < > 6.2  --  4.5  --  4.7 4.4  --   --  4.2  NEUTROABS 4.7  --   --   --   --   --   --   --   --   --   --   HGB 8.4*   < > 8.8*   < > 7.8*   < > 8.1* 8.5* 8.8*  8.5* 9.2* 8.3*  HCT 28.1*   < > 30.0*   < > 26.5*   < > 27.2* 28.2* 26.0*  25.0* 27.0* 27.7*  MCV 90.4   < > 90.6  --  91.1  --  91.9 90.7  --   --  89.6  PLT 288   < > 281  --  232  --  248 250  --   --  252   < > = values in this interval not displayed.    Basic Metabolic Panel: Recent Labs  Lab 09/26/19 1334 09/26/19 1857 09/27/19 0612 09/27/19 0612 09/28/19 0731 09/28/19 0731 09/29/19 0323 09/30/19 0515 09/30/19 1514 09/30/19 1520 10/01/19 0507  NA 131*  --  135   < > 136   < > 133* 135 135  139 136 134*  K 3.8  --  3.4*   < > 4.3   < > 3.8 3.6 3.9  3.6 4.0 3.8  CL 97*  --  100  --  100  --  98 98  --   --  97*  CO2 24  --  24  --  25  --  27 27  --   --  29  GLUCOSE 219*  --  154*  --  71  --  92 138*  --   --  77  BUN 58*  --  62*  --  61*  --  61* 59*  --   --  55*  CREATININE 2.56*   < > 2.59*  --  2.34*  --  2.33* 2.01*  --   --  1.79*  CALCIUM 8.6*  --  8.7*  --  8.8*  --  8.8* 9.0  --   --  9.1  MG 2.1  --   --   --  2.3  --   --   --   --   --  2.0   < > = values in this interval not displayed.    GFR: Estimated Creatinine Clearance: 27.4 mL/min (A) (by C-G formula based on SCr of 1.79 mg/dL  (H)).  Liver Function Tests: Recent Labs  Lab 09/26/19 1334  AST 20  ALT 14  ALKPHOS 93  BILITOT 0.5  PROT 5.6*  ALBUMIN 2.8*    CBG: Recent Labs  Lab 09/30/19 1204 09/30/19 1719 09/30/19 2122 10/01/19 0616 10/01/19 0743  GLUCAP 106* 115* 150* 71 98     Recent Results (from the past 240 hour(s))  SARS Coronavirus 2 by RT PCR (hospital order, performed in [facility] hospital lab) Nasopharyngeal Nasopharyngeal Swab     Status: None   Collection Time: 09/26/19  4:22 PM   Specimen: Nasopharyngeal Swab  Result Value Ref Range Status   SARS Coronavirus 2 NEGATIVE NEGATIVE Final    Comment: (NOTE) SARS-CoV-2 target nucleic acids are NOT DETECTED.  The SARS-CoV-2 RNA is generally detectable in upper and lower respiratory specimens during the acute phase of infection. The lowest concentration of SARS-CoV-2 viral copies this assay can detect is 250 copies / mL. A negative result does not preclude SARS-CoV-2 infection and should not be used as the sole basis for treatment or other patient management decisions.  A negative result may occur with improper specimen collection / handling, submission of specimen other than nasopharyngeal swab, presence of viral mutation(s) within the areas targeted by this assay, and inadequate number of viral copies (<250 copies / mL). A negative result must be combined with clinical observations, patient history, and epidemiological information.  Fact Sheet for Patients:   StrictlyIdeas.no  Fact Sheet for Healthcare Providers: BankingDealers.co.za  This test is not yet approved or  cleared by the [place] FDA and has been authorized for detection and/or diagnosis of SARS-CoV-2 by FDA under an Emergency Use  Authorization (EUA).  This EUA will remain in effect (meaning this test can be used) for the duration of the COVID-19 declaration under Section 564(b)(1) of the Act, 21 U.S.C. section  360bbb-3(b)(1), unless the authorization is terminated or revoked sooner.  Performed at [facility], [place] [address] [place], [place] [address]          Radiology Studies: CARDIAC CATHETERIZATION  Result Date: 09/30/2019 Findings: RA = 13 RV = 63/16 PA =  62/23 (40) PCW = 27 (v=43) Fick cardiac output/index = 4.8/2.9 Thermo CO/CI = 2.3/1.4 PVR = 5.7 WU (Thermo) Ao sat = 95% PA sat = 54%, 56% Assessment: 1. Markedly elevated biventricular filling pressures 2. Marked discrepancy between Fick and Thermo cardiac outputs; clinical picture most c/w Thermo numbers Plan/Discussion: Start IV milrinone. Continue high-dose IV lasix. Plan cMRI to further evaluate cause of worsening cardiomyopathy. Glori Bickers, MD 3:33 PM       Scheduled Meds: . allopurinol  200 mg Oral QHS  . aspirin EC  81 mg Oral q AM  . atorvastatin  80 mg Oral QHS  . DULoxetine  30 mg Oral QHS  . gabapentin  100 mg Oral QHS  . heparin  5,000 Units Subcutaneous Q8H  . hydrALAZINE  10 mg Oral Q8H  . insulin aspart  0-5 Units Subcutaneous QHS  . insulin aspart  0-9 Units Subcutaneous TID WC  . insulin glargine  15 Units Subcutaneous QHS  . pantoprazole  20 mg Oral Daily  . potassium chloride  40 mEq Oral Once  . primidone  50 mg Oral QHS  . sodium chloride flush  3 mL Intravenous Q12H  . sodium chloride flush  3 mL Intravenous Q12H  . sodium chloride flush  3 mL Intravenous Q12H   Continuous Infusions: . sodium chloride 250 mL (09/30/19 1625)  . sodium chloride    . ferumoxytol    . furosemide 120 mg (10/01/19 0516)  . milrinone 0.25 mcg/kg/min (09/30/19 1751)     LOS: 5 days     Yaakov Guthrie, MD [facility] Pager on amion  To contact the attending provider between 7A-7P or the covering provider during after hours 7P-7A, please log into the web site [email or web address] and access using universal [place] password for that web site. If you do not have the password, please call the  hospital operator.  10/01/2019, 9:14 AM

## 2019-10-01 NOTE — Progress Notes (Signed)
[facility] Progress Note    Assessment/ Plan:   1.  Acute on chronic systolic CHF exacerbation: cardiology consulted.  Repeat TTE showing worsening EF, 20-25%.  Trops flat.  Coreg dose reduced in the setting of possible low- output state and was later discontinued per CHF and milrninone was started. - advanced heart failure consulted - appreciate their assistance; milrinone per CHF   - ordered strict ins/outs - noted CHF checking cMRI and myeloma panel with concern for amyloid or HTN cardiomyopathy   2.  AKI on CKD 3b: likely cardiorenal +/- hypotension.  Cr had been up to 2.5 from baseline of 1.9.  Renal US without obstruction.  - Improving with supportive care/diuresis - Continue lasix 120 mg IV every 12 hours for now  - will also give potassium 40 meq once today  3.  Acute on chronic hypoxic RF: on O2, wean as able with vol removal.  Note she reports home requirement of 4 liters   4. normocytic anemia   - iron deficiency  - Will give feraheme 510 mg IV x 1 dose   5.  DM II: per primary team   6.  Dispo: pending clinical improvement    Subjective:    She feels better today.  She had 3.3 liters UOP over 6/28.  Got metolazone once and lasix 120 mg IV every 12 hours.  CHF was consulted.  Milrinone was started yesterday and she is on 0.25 mcg/kg/min on my exam. Reports home oxygen regimen of 4 liters.  States she feels fluid in abdomen and lungs - better.  Heart cath with markedly elevated biventricular filling pressures.   Review of systems:  Reports still short of breath - but is better Denies n/v Denies chest pain    Objective:   BP (!) 128/56 (BP Location: Right Arm)   Pulse 73   Temp 98.3 F (36.8 C) (Oral)   Resp 16   Ht 5' (1.524 m)   Wt 68.3 kg   SpO2 100%   BMI 29.41 kg/m   Intake/Output Summary (Last 24 hours) at 10/01/2019 0850 Last data filed at 10/01/2019 4481 Gross per 24 hour  Intake 753.61 ml  Output 3250 ml  Net -2496.39 ml    Weight change: -0.726 kg  Physical Exam: GEN adult female in bed in NAD HEENT EOMI sclerae nonicteric PULM on 4 liters O2, unlabored at rest, bibasilar crackles CV S1S2 no rub ABD soft some abd distention noted EXT no pitting LE edema NEURO AAO x 3 no asterixis Psych normal mood and affect   Imaging: CARDIAC CATHETERIZATION  Result Date: 09/30/2019 Findings: RA = 13 RV = 63/16 PA =  62/23 (40) PCW = 27 (v=43) Fick cardiac output/index = 4.8/2.9 Thermo CO/CI = 2.3/1.4 PVR = 5.7 WU (Thermo) Ao sat = 95% PA sat = 54%, 56% Assessment: 1. Markedly elevated biventricular filling pressures 2. Marked discrepancy between Fick and Thermo cardiac outputs; clinical picture most c/w Thermo numbers Plan/Discussion: Start IV milrinone. Continue high-dose IV lasix. Plan cMRI to further evaluate cause of worsening cardiomyopathy. Glori Bickers, MD 3:33 PM   Labs: BMET Recent Labs  Lab 09/26/19 1334 09/26/19 1334 09/26/19 1857 09/27/19 0612 09/28/19 0731 09/29/19 0323 09/30/19 0515 09/30/19 1514 09/30/19 1520 10/01/19 0507  NA 131*   < >  --  135 136 133* 135 135  139 136 134*  K 3.8   < >  --  3.4* 4.3 3.8 3.6 3.9  3.6 4.0 3.8  CL 97*  --   --  100 100 98 98  --   --  97*  CO2 24  --   --  24 25 27 27   --   --  29  GLUCOSE 219*  --   --  154* 71 92 138*  --   --  77  BUN 58*  --   --  62* 61* 61* 59*  --   --  55*  CREATININE 2.56*  --  2.47* 2.59* 2.34* 2.33* 2.01*  --   --  1.79*  CALCIUM 8.6*  --   --  8.7* 8.8* 8.8* 9.0  --   --  9.1   < > = values in this interval not displayed.   CBC Recent Labs  Lab 09/26/19 1427 09/26/19 1857 09/28/19 0731 09/28/19 0731 09/29/19 0323 09/29/19 0323 09/30/19 0515 09/30/19 1514 09/30/19 1520 10/01/19 0507  WBC 6.5   < > 4.5  --  4.7  --  4.4  --   --  4.2  NEUTROABS 4.7  --   --   --   --   --   --   --   --   --   HGB 8.4*   < > 7.8*   < > 8.1*   < > 8.5* 8.8*  8.5* 9.2* 8.3*  HCT 28.1*   < > 26.5*   < > 27.2*   < > 28.2*  26.0*  25.0* 27.0* 27.7*  MCV 90.4   < > 91.1  --  91.9  --  90.7  --   --  89.6  PLT 288   < > 232  --  248  --  250  --   --  252   < > = values in this interval not displayed.    Medications:    . allopurinol  200 mg Oral QHS  . aspirin EC  81 mg Oral q AM  . atorvastatin  80 mg Oral QHS  . DULoxetine  30 mg Oral QHS  . gabapentin  100 mg Oral QHS  . heparin  5,000 Units Subcutaneous Q8H  . hydrALAZINE  10 mg Oral Q8H  . insulin aspart  0-5 Units Subcutaneous QHS  . insulin aspart  0-9 Units Subcutaneous TID WC  . insulin glargine  15 Units Subcutaneous QHS  . pantoprazole  20 mg Oral Daily  . primidone  50 mg Oral QHS  . sodium chloride flush  3 mL Intravenous Q12H  . sodium chloride flush  3 mL Intravenous Q12H  . sodium chloride flush  3 mL Intravenous Q12H   Claudia Desanctis MD 10/01/2019, 9:01 AM

## 2019-10-01 NOTE — Progress Notes (Signed)
SBAR received from night shift. No signs of distress noted. Pt sleeping but awakes when name is called. No needs or concerns at this time. Will continue to monitor.

## 2019-10-01 NOTE — Progress Notes (Signed)
PT Cancellation Note  Patient Details Name: Baylie Drakes MRN: [id number] DOB: 03-29-1956   Cancelled Treatment:    Reason Eval/Treat Not Completed: Patient at procedure or test/unavailable (Pt being taken for MRI.  )   Denice Paradise 10/01/2019, 10:14 AM Emmali Karow W,PT Acute Rehabilitation Services Pager:  [phone]  Office:  [phone]

## 2019-10-01 NOTE — Progress Notes (Signed)
Occupational Therapy Treatment Patient Details Name: Kara Hanson MRN: [id number] DOB: November 24, 1955 Today's Date: 10/01/2019    History of present illness 64 y.o. female with medical history significant of hypertension, hyperlipidemia, insulin-dependent diabetes mellitus, systolic congestive heart failure with ejection fraction of 40 to 45%, morbid obesity, CKD stage IIIb, chronic hypoxemic respiratory failure-on 4 L of oxygen via nasal cannulae at home presents to emergency department due to worsening shortness of breath, leg edema since 1 week. Admitted 6/24 Chest x-ray shows mild cardiomegaly and interstitial edema, trace right pleural effusion.Admitted 6/24 for tratment of acute on chronic CHF   OT comments  Pt received supine in bed, asleep upon OTA arrival with pt needing max encouragement to participate in session. Overall, pt requires MIN guard for household distance functional mobility with pt holding on to IV pole during ambulation, 1 LOB noted needing MIN A to correct. Pt noted to be incontinent of bowels during session however pt able to compete pericare in standing with supervision- set- up assist. Pt on 4L with VSS. Agree with DC plan below, will follow for OT needs.   Follow Up Recommendations  No OT follow up    Equipment Recommendations  Tub/shower seat    Recommendations for Other Services      Precautions / Restrictions Precautions Precautions: Fall Precaution Comments: incontinent of bowels Restrictions Weight Bearing Restrictions: No       Mobility Bed Mobility Overal bed mobility: Needs Assistance Bed Mobility: Sit to Supine       Sit to supine: Supervision   General bed mobility comments: increased time and effort, but no physical assist  Transfers Overall transfer level: Needs assistance Equipment used: 1 person hand held assist;None (pushing IV pole duirng mobility) Transfers: Sit to/from Stand Sit to Stand: Min guard         General transfer  comment: minguard for safety during sit<>stands; pt holding on to IV pole during ambulation, 1 LOB when ambulating needing MIN A to correct    Balance Overall balance assessment: Needs assistance Sitting-balance support: No upper extremity supported;Single extremity supported Sitting balance-Leahy Scale: Good     Standing balance support: Single extremity supported Standing balance-Leahy Scale: Poor Standing balance comment: LOB noted during mobility needing MIN A to correct                           ADL either performed or assessed with clinical judgement   ADL Overall ADL's : Needs assistance/impaired     Grooming: Wash/dry hands;Standing;Supervision/safety;Min guard Grooming Details (indicate cue type and reason): minguard for line mgmt Upper Body Bathing: Supervision/ safety;Standing   Lower Body Bathing: Sit to/from stand;Supervison/ safety;Set up Lower Body Bathing Details (indicate cue type and reason): to wash periarea post incontinent BM Upper Body Dressing : Set up;Sitting   Lower Body Dressing: Supervision/safety;Sit to/from stand   Toilet Transfer: Min guard;Ambulation;Regular Toilet;Grab bars Toilet Transfer Details (indicate cue type and reason): minguard for safety with line mgmt Toileting- Clothing Manipulation and Hygiene: Supervision/safety;Sit to/from stand;Set up [place] Manipulation Details (indicate cue type and reason): pt completing anterior/ posterior pericare with supervision- set- up assist for several mins d/t incontinent episode     Functional mobility during ADLs: Min guard;Rolling walker General ADL Comments: pt able to complete household distance functional mobility holding on to IV pole for balance, pt incontinent of bowels during mobility but able to complete pericare with supervision- set- up assist     Vision  Perception     Praxis      Cognition Arousal/Alertness: Lethargic;Suspect due to medications (per  nsg pt always lethargic) Behavior During Therapy: Flat affect Overall Cognitive Status: Within Functional Limits for tasks assessed                                          Exercises     Shoulder Instructions       General Comments pt on 4L throughout session with VSS; pt with incontinent episode during mobility    Pertinent Vitals/ Pain       Pain Assessment: No/denies pain  Home Living                                          Prior Functioning/Environment              Frequency  Min 2X/week        Progress Toward Goals  OT Goals(current goals can now be found in the care plan section)  Progress towards OT goals: Progressing toward goals  Acute Rehab OT Goals Patient Stated Goal: return home OT Goal Formulation: With patient Time For Goal Achievement: 10/11/19 Potential to Achieve Goals: Good  Plan Discharge plan remains appropriate;Frequency remains appropriate    Co-evaluation                 AM-PAC OT "6 Clicks" Daily Activity     Outcome Measure   Help from another person eating meals?: None Help from another person taking care of personal grooming?: A Little Help from another person toileting, which includes using toliet, bedpan, or urinal?: A Little Help from another person bathing (including washing, rinsing, drying)?: A Little Help from another person to put on and taking off regular upper body clothing?: None Help from another person to put on and taking off regular lower body clothing?: A Little 6 Click Score: 20    End of Session Equipment Utilized During Treatment: Gait belt;Other (comment) (pt pushing IV pole during ambulation)  OT Visit Diagnosis: Other abnormalities of gait and mobility (R26.89);Unsteadiness on feet (R26.81)   Activity Tolerance Patient tolerated treatment well   Patient Left with call bell/phone within reach;in bed;with bed alarm set   Nurse Communication Mobility status         Time: 1553-1630 OT Time Calculation (min): 37 min  Charges: OT General Charges $OT Visit: 1 Visit OT Treatments $Self Care/Home Management : 23-37 mins  Lanier Clam., COTA/L Acute Rehabilitation Services [phone] [place] 10/01/2019, 4:51 PM

## 2019-10-02 ENCOUNTER — Inpatient Hospital Stay (HOSPITAL_COMMUNITY): Payer: Medicare Other

## 2019-10-02 LAB — BASIC METABOLIC PANEL
Anion gap: 9 (ref 5–15)
BUN: 51 mg/dL — ABNORMAL HIGH (ref 8–23)
CO2: 28 mmol/L (ref 22–32)
Calcium: 9.4 mg/dL (ref 8.9–10.3)
Chloride: 96 mmol/L — ABNORMAL LOW (ref 98–111)
Creatinine, Ser: 1.83 mg/dL — ABNORMAL HIGH (ref 0.44–1.00)
GFR calc Af Amer: 33 mL/min — ABNORMAL LOW (ref >60)
GFR calc non Af Amer: 29 mL/min — ABNORMAL LOW (ref >60)
Glucose, Bld: 58 mg/dL — ABNORMAL LOW (ref 70–99)
Potassium: 4.4 mmol/L (ref 3.5–5.1)
Sodium: 133 mmol/L — ABNORMAL LOW (ref 135–145)

## 2019-10-02 LAB — MULTIPLE MYELOMA PANEL, SERUM
Albumin SerPl Elph-Mcnc: 2.6 g/dL — ABNORMAL LOW (ref 2.9–4.4)
Albumin/Glob SerPl: 1 (ref 0.7–1.7)
Alpha 1: 0.3 g/dL (ref 0.0–0.4)
Alpha2 Glob SerPl Elph-Mcnc: 0.8 g/dL (ref 0.4–1.0)
B-Globulin SerPl Elph-Mcnc: 0.9 g/dL (ref 0.7–1.3)
Gamma Glob SerPl Elph-Mcnc: 0.6 g/dL (ref 0.4–1.8)
Globulin, Total: 2.7 g/dL (ref 2.2–3.9)
IgA: 258 mg/dL (ref 87–352)
IgG (Immunoglobin G), Serum: 681 mg/dL (ref 586–1602)
IgM (Immunoglobulin M), Srm: 28 mg/dL (ref 26–217)
Total Protein ELP: 5.3 g/dL — ABNORMAL LOW (ref 6.0–8.5)

## 2019-10-02 LAB — CBC
HCT: 28.4 % — ABNORMAL LOW (ref 36.0–46.0)
Hemoglobin: 8.6 g/dL — ABNORMAL LOW (ref 12.0–15.0)
MCH: 27.1 pg (ref 26.0–34.0)
MCHC: 30.3 g/dL (ref 30.0–36.0)
MCV: 89.6 fL (ref 80.0–100.0)
Platelets: 247 10*3/uL (ref 150–400)
RBC: 3.17 MIL/uL — ABNORMAL LOW (ref 3.87–5.11)
RDW: 17.2 % — ABNORMAL HIGH (ref 11.5–15.5)
WBC: 5.3 10*3/uL (ref 4.0–10.5)
nRBC: 0 % (ref 0.0–0.2)

## 2019-10-02 LAB — GLUCOSE, CAPILLARY
Glucose-Capillary: 52 mg/dL — ABNORMAL LOW (ref 70–99)
Glucose-Capillary: 87 mg/dL (ref 70–99)
Glucose-Capillary: 71 mg/dL (ref 70–99)
Glucose-Capillary: 153 mg/dL — ABNORMAL HIGH (ref 70–99)
Glucose-Capillary: 111 mg/dL — ABNORMAL HIGH (ref 70–99)

## 2019-10-02 LAB — RETICULOCYTES
Immature Retic Fract: 12.9 % (ref 2.3–15.9)
RBC.: 3.17 MIL/uL — ABNORMAL LOW (ref 3.87–5.11)
Retic Count, Absolute: 104.6 10*3/uL (ref 19.0–186.0)
Retic Ct Pct: 3.3 % — ABNORMAL HIGH (ref 0.4–3.1)

## 2019-10-02 LAB — IRON AND TIBC
Iron: 338 ug/dL — ABNORMAL HIGH (ref 28–170)
Saturation Ratios: 130 % — ABNORMAL HIGH (ref 10.4–31.8)
TIBC: 260 ug/dL (ref 250–450)

## 2019-10-02 LAB — FERRITIN: Ferritin: 132 ng/mL (ref 11–307)

## 2019-10-02 LAB — FOLATE: Folate: 10.8 ng/mL (ref >5.9)

## 2019-10-02 LAB — VITAMIN B12: Vitamin B-12: 644 pg/mL (ref 180–914)

## 2019-10-02 MED ORDER — TECHNETIUM TC 99M PYROPHOSPHATE
20.5000 | Freq: Once | INTRAVENOUS | Status: AC | PRN
  Administered 2019-10-02: 20.5 via INTRAVENOUS
  Filled 2019-10-02: qty 21

## 2019-10-02 NOTE — Progress Notes (Signed)
Inpatient Diabetes Program Recommendations  AACE/ADA: New Consensus Statement on Inpatient Glycemic Control (2015)  Target Ranges:  Prepandial:   less than 140 mg/dL      Peak postprandial:   less than 180 mg/dL (1-2 hours)      Critically ill patients:  140 - 180 mg/dL   Lab Results  Component Value Date   GLUCAP 87 10/02/2019   HGBA1C 7.1 (H) 09/26/2019    Review of Glycemic Control Results for Kara Hanson, Kara Hanson (MRN [id number]) as of 10/02/2019 12:14  Ref. Range 10/01/2019 13:19 10/01/2019 16:30 10/01/2019 21:32 10/02/2019 06:28 10/02/2019 07:09  Glucose-Capillary Latest Ref Range: 70 - 99 mg/dL 114 (H) 89 144 (H) 52 (L) 87   Diabetes history:  DM2 Outpatient Diabetes medications:  Lantus 15 units daily  Novolog 0-12 units tid Current orders for Inpatient glycemic control:  Lantus 12 units qhs Novolog 0-9 units tid     Inpatient Diabetes Program Recommendations:     Decrease Lantus to 8 units qhs  Will continue to follow while inpatient.  Thank you, Reche Dixon, RN, BSN Diabetes Coordinator Inpatient Diabetes Program [phone] (team pager from 8a-5p)

## 2019-10-02 NOTE — Care Management Important Message (Signed)
Important Message  Patient Details  Name: Julliana Whitmyer MRN: [id number] Date of Birth: [birth year]   Medicare Important Message Given:  Yes     [name] 10/02/2019, 8:21 AM

## 2019-10-02 NOTE — Progress Notes (Signed)
Pt stable. o2 via  at 4L. No signs of distress noted. Pt sleeping. Will continue to monitor.

## 2019-10-02 NOTE — Progress Notes (Deleted)
Pt sitting in chair. No sign of distress noted. No needs or concerns at this time. Will continue monitor.

## 2019-10-02 NOTE — Progress Notes (Addendum)
Advanced Heart Failure Rounding Note  PCP-Cardiologist: Candee Furbish, MD   Subjective:     Kara Hanson was diuresed w/ 120 IV lasix BID + 5mg  metolazone.   Creatinine trending down from admission  2. 47, today 1.83  Cardiac MRI completed yesterday, 6/29  IMPRESSION: 1 LVEF 26%.  2. RVEF 46%. Normal RV chamber size.   3. No subendocardial delayed myocardial enhancement, myocardium appears viable.   4. There is post contrast delayed myocardial enhancement in the LV myocardium at the inferior RV insertion point which can be seen with increased pulmonary pressure.   5. Normal myocardial nulling kinetics, no evidence of cardiac amyloidosis.   6. ECV is 29%, which may be consistent with hypertensive heart disease.  Overall, pt feels very drowsy/tired today despite being able to sleep last night. Too tired to eat most of her breakfast.  Is able to get out of bed and walk to restroom with mild DOE. Denies pain   Objective:   Weight Range: 66.6 kg Body mass index is 28.69 kg/m.   Vital Signs:   Temp:  [97.7 F (36.5 C)-98.3 F (36.8 C)] 98.3 F (36.8 C) (06/30 0451) Pulse Rate:  [70-84] 84 (06/30 0451) Resp:  [16-18] 18 (06/30 0451) BP: (118-134)/(58-69) 134/60 (06/30 0451) SpO2:  [98 %-99 %] 98 % (06/30 0451) Weight:  [66.6 kg] 66.6 kg (06/30 0459) Last BM Date: 09/28/19  Weight change: Filed Weights   09/30/19 0440 10/01/19 0516 10/02/19 0459  Weight: 69 kg 68.3 kg 66.6 kg    Intake/Output:   Intake/Output Summary (Last 24 hours) at 10/02/2019 1114 Last data filed at 10/02/2019 0852 Gross per 24 hour  Intake 462.38 ml  Output 1100 ml  Net -637.62 ml      Physical Exam    General:  Appears chronically ill. No resp difficulty HEENT: Normal Neck: Supple. JVP 11-12 . Carotids 2+ bilat; no bruits. No lymphadenopathy or thyromegaly appreciated. Cor: PMI nondisplaced. Regular rate & rhythm. No rubs, gallops or murmurs. Lungs: on 4 liters, crackles in posterior  bases bilaterally Abdomen: Soft, nontender, nondistended. No hepatosplenomegaly. No bruits or masses. Good bowel sounds. Extremities: No cyanosis, clubbing, rash, no edema Neuro: Alert & orientedx3, cranial nerves grossly intact. moves all 4 extremities w/o difficulty. Affect flat    Telemetry  SR 70-80s, T wave inversion  Personally reviewed .    EKG    09/30/2019: NSR , repolarization abnormality suggests, ischemia, anterolateral (ST dep, T neg, I aVL V2-V6).   Labs    CBC Recent Labs    10/01/19 0507 10/02/19 0608  WBC 4.2 5.3  HGB 8.3* 8.6*  HCT 27.7* 28.4*  MCV 89.6 89.6  PLT 252 409   Basic Metabolic Panel Recent Labs    10/01/19 0507 10/02/19 0608  NA 134* 133*  K 3.8 4.4  CL 97* 96*  CO2 29 28  GLUCOSE 77 58*  BUN 55* 51*  CREATININE 1.79* 1.83*  CALCIUM 9.1 9.4  MG 2.0  --    Liver Function Tests No results for input(s): AST, ALT, ALKPHOS, BILITOT, PROT, ALBUMIN in the last 72 hours. No results for input(s): LIPASE, AMYLASE in the last 72 hours. Cardiac Enzymes No results for input(s): CKTOTAL, CKMB, CKMBINDEX, TROPONINI in the last 72 hours.  BNP: BNP (last 3 results) Recent Labs    05/18/19 0926 07/01/19 1244 09/26/19 1427  BNP 875.8* 1,267.1* 1,420.6*    ProBNP (last 3 results) No results for input(s): PROBNP in the last 8760 hours.  D-Dimer No results for input(s): DDIMER in the last 72 hours. Hemoglobin A1C No results for input(s): HGBA1C in the last 72 hours. Fasting Lipid Panel No results for input(s): CHOL, HDL, LDLCALC, TRIG, CHOLHDL, LDLDIRECT in the last 72 hours. Thyroid Function Tests No results for input(s): TSH, T4TOTAL, T3FREE, THYROIDAB in the last 72 hours.  Invalid input(s): FREET3  Other results:   Imaging    . MR CARDIAC MORPHOLOGY W WO CONTRAST .  Marland Kitchen Result Date: 10/01/2019 . CLINICAL DATA:  Myocardial viability evaluation, EF <30% COMPARISON: Echo 09/27/19 EXAM: CARDIAC MRI TECHNIQUE: The patient was  scanned on a 1.5 Tesla GE magnet. A dedicated cardiac coil was used. Functional imaging was done using Fiesta sequences. 2,3, and 4 chamber views were done to assess for RWMA's. Modified Simpson's rule using a short axis stack was used to calculate an ejection fraction on a dedicated work Conservation officer, nature. The patient received 43mL GADAVIST GADOBUTROL 1 MMOL/ML IV SOLN. After 10 minutes inversion recovery sequences were used to assess for infiltration and scar tissue. FINDINGS: LEFT VENTRICLE: Mild-moderately dilated left ventricular chamber size. Mild concentric left ventricular hypertrophy. Maximal wall thickness 11 mm. Severely reduced left ventricular systolic function. LVEF = 26% Severe global hypokinesis. There is post contrast delayed myocardial enhancement in the inferior RV insertion point which can be seen with increased pulmonary pressure. No subendocardial delayed myocardial enhancement, myocardium appears viable. Normal T1 myocardial nulling kinetics suggest against a diagnosis of cardiac amyloidosis. ECV = 29%, which may be consistent with hypertensive heart disease. RIGHT VENTRICLE: Normal right ventricular chamber size. Normal right ventricular wall thickness. Borderline reduced right ventricular systolic function. RVEF = 46%. There are no regional wall motion abnormalities. No post contrast delayed myocardial enhancement. ATRIA: Moderately dilated left atrial chamber size. Normal right atrial size. VALVES: No significant valvular abnormalities. Tricuspid aortic valve. Flow dephasing at the aortic valve phase contrast of the aortic valve performed. Maximum systolic velocity is 4.17 m/s. Quantitation may be subject to technical image acquisition factors, and may be underestimated in the setting of LV dysfunction. PERICARDIUM: Normal pericardium.  Small pericardial effusion. OTHER: Small-moderate bilateral pleural effusion. No other significant extracardiac findings. MEASUREMENTS: Left  ventricle: LV Female LV EF: 26% (normal 56-78%) Absolute volumes: LV EDV: 125mL (Normal 52-141 mL) LV ESV: 157mL (Normal 13-51 mL) LV SV: 11mL (Normal 33-97 mL) CO: 3.1L/min (Normal 2.7-6.0 L/min) Indexed volumes: LV EDV: 135mL/sq-m (normal 41-81 mL/sq-m) LV ESV: 65mL/sq-m (Normal 12-21 mL/sq-m) LV SV: 21mL/sq-m (Normal 26-56 mL/sq-m) CI: 1.84L/min/sq-m (Normal 1.8-3.8 L/min/sq-m) Right ventricle: RV female RV EF: 46% (Normal 47-80%) Absolute volumes: RV EDV: 107 mL (Normal 58-154 mL) RV ESV: 58 mL (Normal 12-68 mL) RV SV: 49 mL (Normal 35-98 mL) CO: 3.4 L/min (Normal 2.7-6 L/min) Indexed volumes: RV EDV: 63 mL/sq-m (Normal 48-87 mL/sq-m) RV ESV: 34 mL/sq-m (Normal 11-28 mL/sq-m) RV SV: 29 mL/sq-m (Normal 27-57 mL/sq-m) CI: 2.0  L/min/sq-m (Normal 1.8-3.8 L/min/sq-m) IMPRESSION: 1. Severely reduced left ventricular systolic function. LVEF 26%. Mild-moderately enlarged LV chamber size. 2. Borderline reduced right ventricular systolic function. RVEF 46%. Normal RV chamber size. 3. No subendocardial delayed myocardial enhancement, myocardium appears viable. 4. There is post contrast delayed myocardial enhancement in the LV myocardium at the inferior RV insertion point which can be seen with increased pulmonary pressure. 5. Normal myocardial nulling kinetics, no evidence of cardiac amyloidosis. 6. ECV is 29%, which may be consistent with hypertensive heart disease. Electronically Signed   By: Cherlynn Kaiser   On: 10/01/2019 18:33  .  Medications:     Scheduled Medications: . . . allopurinol .  200 mg . Oral . QHS  . . . aspirin EC .  81 mg . Oral . q AM  . . Marland Kitchen atorvastatin .  80 mg . Oral . QHS  . . . DULoxetine .  30 mg . Oral . QHS  . . . gabapentin .  100 mg . Oral . QHS  . Marland Kitchen Marland Kitchen heparin .  5,000 Units . Subcutaneous . Q8H  . Marland Kitchen . hydrALAZINE .  10 mg . Oral . Q8H  . Marland Kitchen . insulin aspart .  0-5 Units . Subcutaneous . QHS  . Marland Kitchen . insulin aspart .  0-9 Units . Subcutaneous . TID WC  . Marland Kitchen . insulin  glargine .  12 Units . Subcutaneous . QHS  . Marland Kitchen . pantoprazole .  20 mg . Oral . Daily  . . . primidone .  50 mg . Oral . QHS  . Marland Kitchen . sodium chloride flush .  3 mL . Intravenous . Q12H  . Marland Kitchen . sodium chloride flush .  3 mL . Intravenous . Q12H  . Marland Kitchen . sodium chloride flush .  3 mL . Intravenous . Q12H  .   Infusions: . . . sodium chloride . 250 mL (10/02/19 0446)  . . . sodium chloride .    . . . furosemide . 120 mg (10/02/19 0447)  . . . milrinone . 0.25 mcg/kg/min (10/02/19 0231)  .   PRN Medications: . sodium chloride, sodium chloride, acetaminophen, metoCLOPramide, nitroGLYCERIN, ondansetron (ZOFRAN) IV, sodium chloride flush, sodium chloride flush    Patient Profile   64 y/o female w/ h/o HTN, HLD, IDDM, Stage 3 CKD, chronic systolic and diastolic heart failure, NICM and history of poor med compliance. She is followed by Paramedicine. Multiple admissions in the last year for a/c CHF  Assessment/Plan   1. Acute on Chronic Combined Systolic and Diastolic CHF - NICM - Echo in 2018 w/ EF 30-35% (LHC w/ normal cors and normal EF by LVG 50-55%) - Echo 11/2017 & 10/2018 LVEF 50-55% - Echo 01/2019 EF 40-45% - Echo 02/2019 EF 30-35%, G3DD. RV ok. R/LHC 11/20 w/ mild CAD and normal CO - Echo 09/2019 EF 20-25%, G3DD (restrictive), RV normal.  - h/o poor med compliance (followed by paramedicine) w/ multiple admissions for a/c CHF in the last 12 months - Viral cardiomyopathy as she did have COVID PNA 07/2018 and EF had been normal prior to this  - MRI 10/01/19 w/ LVEF 26%, RVEF 46%, no subendocardial delayed myocardial enhancement, ECV is 29%, and no evidence of cardiac amyloidosis. - plan outpatient sleep study  - RHC with elevated filling pressures and low output HF.  - Milrinone added post cath. Improved diuresis noted.  - Nephrology following and dosing diuretics. Continue IV Lasix 120 mg bid  - no ARB/ ARNI/ Spiro/ Digoxin w/ CKD - continue hydralazine. Intolerant to Imdur  - consider  SGLT2i  - needs to improve compliance w/ home meds. Continue paramedcine once discharged - not a good candidate for advanced therapies given h/o poor compliance -  PYP scan ordered to r/o TTR amyloid (also h/o syncope, neuropathy and spinal stenosis) - Myeloma Panel pending.    2. Stage III CKD - Prior SCr in March 2021 was 1.77 - SCr on this admit elevated at 2.47, in the setting of volume overload and improving w/ moderate diuresis, down to 1.83  today. - renal  US negative    3. IDDM  - on insulin and controlled - hgb A1c 7.1    4. Hypertension  - Improved. Continue current regimen.     5. Obesity  - Body mass index is 29.72 kg/m.  - educated on healthy diet    6. Poor Med Compliance - continue w/ paramedicine once discharged   7. Chronic Hypoxic Respiratory Failure On 4 liters prior to admit. Sats stable.   8. Anemia  -Hgb last 24 hours stable. 8.3, today 8.6. -RBC 3.17 - iron 25 on 6/28, given Faraheme 6/29, iron today 338 -   Length of Stay: Add[place], RN  10/02/2019, 11:14 AM  Advanced Heart Failure Team Pager [phone] (M-F; 7a - 4p)  Please contact [facility] for night-coverage after hours (4p -7a ) and weekends on [email or web address]  11:14 AM   Patient seen and examined with the above-signed Advanced Practice Provider and/or Housestaff. I personally reviewed laboratory data, imaging studies and relevant notes. I independently examined the patient and formulated the important aspects of the plan. I have edited the note to reflect any of my changes or salient points. I have personally discussed the plan with the patient and/or family.  Remains weak and fatigued. Volume status improving with IV lasix and milrinone.   cMRI EF 26%. No LGE or amyloid.  PYP negative  General:  Weak appearing. No resp difficulty HEENT: normal Neck: supple. JVP jaw . Carotids 2+ bilat; no bruits. No lymphadenopathy or thryomegaly appreciated. Cor: PMI nondisplaced.  Regular rate & rhythm. No rubs, gallops or murmurs. Lungs: clear Abdomen: soft, nontender, nondistended. No hepatosplenomegaly. No bruits or masses. Good bowel sounds. Extremities: no cyanosis, clubbing, rash, edema Neuro: alert & orientedx3, cranial nerves grossly intact. moves all 4 extremities w/o difficulty. Affect pleasant  cMRI and PYP results reviewed. She has severe NICM of unclear etiology. Volume status improving with milrinone support. Renal function back to previous baseline. Would continue milrinone and IV lasix one more day and then begin to wean milrinone. If unable to tolerate milrinone wean may need Palliative Care involvement. Consider SGLT2i with HF and CKD.  Glori Bickers, MD  4:06 PM

## 2019-10-02 NOTE — Progress Notes (Signed)
[facility] Progress Note    Assessment/ Plan:   1.  Acute on chronic systolic CHF exacerbation: cardiology consulted.  Repeat TTE showing worsening EF, 20-25%.  Trops flat.  Coreg dose reduced in the setting of possible low- output state and was later discontinued per CHF and milrninone was started. - advanced heart failure consulted - appreciate their assistance; milrinone per CHF   - ordered strict ins/outs - noted CHF obtained cMRI and myeloma panel (pending) with concern for amyloid or HTN cardiomyopathy. Per cMRI no evidence of cardiac amyloidosis  2.  AKI on CKD 3b: likely cardiorenal +/- hypotension.  Cr had been up to 2.5 from baseline of 1.9.  Renal US without obstruction.  - Has been improving with supportive care/diuresis - Continue lasix 120 mg IV every 12 hours for now - will follow today's labs - if worsening would titrate lasix   3.  Acute on chronic hypoxic RF: on O2, wean as able with vol removal.  Note she reports home requirement of 4 liters   4. normocytic anemia   - iron deficiency  - s/p feraheme 510 mg IV x 1 dose on 6/29  5.  DM II: per primary team   6.  Dispo: pending clinical improvement    Subjective:    She feels better today.  She had 3.3 liters UOP over 6/28 however there is no urine output charted for 6/29 currently.  Per RN she had 700-800 mL overnight and the patient states she had a good amount of urine yesterday during the day that she states they recorded on her board but apparently didn't chart.  Has been on milrinone at 0.25 mcg/kg/min on my exam.   Review of systems:  Reports still short of breath - but is better; still short of breath with exertion Denies n/v Denies chest pain    Objective:   BP 134/60 (BP Location: Left Arm)   Pulse 84   Temp 98.3 F (36.8 C) (Oral)   Resp 18   Ht 5' (1.524 m)   Wt 66.6 kg   SpO2 98%   BMI 28.69 kg/m   Intake/Output Summary (Last 24 hours) at 10/02/2019 5852 Last data  filed at 10/01/2019 2140 Gross per 24 hour  Intake 342.38 ml  Output --  Net 342.38 ml   Weight change: -1.678 kg  Physical Exam: * GEN adult female in bed in NAD HEENT EOMI sclerae nonicteric PULM on 4 liters O2, unlabored at rest, bibasilar crackles CV S1S2 no rub ABD soft some abd distention noted EXT no pitting LE edema NEURO AAO x 3 no asterixis Psych normal mood and affect   Imaging: CARDIAC CATHETERIZATION  Result Date: 09/30/2019 Findings: RA = 13 RV = 63/16 PA =  62/23 (40) PCW = 27 (v=43) Fick cardiac output/index = 4.8/2.9 Thermo CO/CI = 2.3/1.4 PVR = 5.7 WU (Thermo) Ao sat = 95% PA sat = 54%, 56% Assessment: 1. Markedly elevated biventricular filling pressures 2. Marked discrepancy between Fick and Thermo cardiac outputs; clinical picture most c/w Thermo numbers Plan/Discussion: Start IV milrinone. Continue high-dose IV lasix. Plan cMRI to further evaluate cause of worsening cardiomyopathy. Glori Bickers, MD 3:33 PM  MR CARDIAC MORPHOLOGY W WO CONTRAST  Result Date: 10/01/2019 CLINICAL DATA:  Myocardial viability evaluation, EF <30% COMPARISON: Echo 09/27/19 EXAM: CARDIAC MRI TECHNIQUE: The patient was scanned on a 1.5 Tesla GE magnet. A dedicated cardiac coil was used. Functional imaging was done using Fiesta sequences. 2,3, and 4 chamber views  were done to assess for RWMA's. Modified Simpson's rule using a short axis stack was used to calculate an ejection fraction on a dedicated work Conservation officer, nature. The patient received 58mL GADAVIST GADOBUTROL 1 MMOL/ML IV SOLN. After 10 minutes inversion recovery sequences were used to assess for infiltration and scar tissue. FINDINGS: LEFT VENTRICLE: Mild-moderately dilated left ventricular chamber size. Mild concentric left ventricular hypertrophy. Maximal wall thickness 11 mm. Severely reduced left ventricular systolic function. LVEF = 26% Severe global hypokinesis. There is post contrast delayed myocardial enhancement in  the inferior RV insertion point which can be seen with increased pulmonary pressure. No subendocardial delayed myocardial enhancement, myocardium appears viable. Normal T1 myocardial nulling kinetics suggest against a diagnosis of cardiac amyloidosis. ECV = 29%, which may be consistent with hypertensive heart disease. RIGHT VENTRICLE: Normal right ventricular chamber size. Normal right ventricular wall thickness. Borderline reduced right ventricular systolic function. RVEF = 46%. There are no regional wall motion abnormalities. No post contrast delayed myocardial enhancement. ATRIA: Moderately dilated left atrial chamber size. Normal right atrial size. VALVES: No significant valvular abnormalities. Tricuspid aortic valve. Flow dephasing at the aortic valve phase contrast of the aortic valve performed. Maximum systolic velocity is 3.26 m/s. Quantitation may be subject to technical image acquisition factors, and may be underestimated in the setting of LV dysfunction. PERICARDIUM: Normal pericardium.  Small pericardial effusion. OTHER: Small-moderate bilateral pleural effusion. No other significant extracardiac findings. MEASUREMENTS: Left ventricle: LV Female LV EF: 26% (normal 56-78%) Absolute volumes: LV EDV: 170mL (Normal 52-141 mL) LV ESV: 189mL (Normal 13-51 mL) LV SV: 56mL (Normal 33-97 mL) CO: 3.1L/min (Normal 2.7-6.0 L/min) Indexed volumes: LV EDV: 133mL/sq-m (normal 41-81 mL/sq-m) LV ESV: 25mL/sq-m (Normal 12-21 mL/sq-m) LV SV: 64mL/sq-m (Normal 26-56 mL/sq-m) CI: 1.84L/min/sq-m (Normal 1.8-3.8 L/min/sq-m) Right ventricle: RV female RV EF: 46% (Normal 47-80%) Absolute volumes: RV EDV: 107 mL (Normal 58-154 mL) RV ESV: 58 mL (Normal 12-68 mL) RV SV: 49 mL (Normal 35-98 mL) CO: 3.4 L/min (Normal 2.7-6 L/min) Indexed volumes: RV EDV: 63 mL/sq-m (Normal 48-87 mL/sq-m) RV ESV: 34 mL/sq-m (Normal 11-28 mL/sq-m) RV SV: 29 mL/sq-m (Normal 27-57 mL/sq-m) CI: 2.0  L/min/sq-m (Normal 1.8-3.8 L/min/sq-m) IMPRESSION: 1.  Severely reduced left ventricular systolic function. LVEF 26%. Mild-moderately enlarged LV chamber size. 2. Borderline reduced right ventricular systolic function. RVEF 46%. Normal RV chamber size. 3. No subendocardial delayed myocardial enhancement, myocardium appears viable. 4. There is post contrast delayed myocardial enhancement in the LV myocardium at the inferior RV insertion point which can be seen with increased pulmonary pressure. 5. Normal myocardial nulling kinetics, no evidence of cardiac amyloidosis. 6. ECV is 29%, which may be consistent with hypertensive heart disease. Electronically Signed   By: Cherlynn Kaiser   On: 10/01/2019 18:33    Labs: BMET Recent Labs  Lab 09/26/19 1334 09/26/19 1334 09/26/19 1857 09/27/19 0612 09/28/19 0731 09/29/19 0323 09/30/19 0515 09/30/19 1514 09/30/19 1520 10/01/19 0507  NA 131*   < >  --  135 136 133* 135 135  139 136 134*  K 3.8   < >  --  3.4* 4.3 3.8 3.6 3.9  3.6 4.0 3.8  CL 97*  --   --  100 100 98 98  --   --  97*  CO2 24  --   --  24 25 27 27   --   --  29  GLUCOSE 219*  --   --  154* 71 92 138*  --   --  77  BUN 58*  --   --  62* 61* 61* 59*  --   --  55*  CREATININE 2.56*  --  2.47* 2.59* 2.34* 2.33* 2.01*  --   --  1.79*  CALCIUM 8.6*  --   --  8.7* 8.8* 8.8* 9.0  --   --  9.1   < > = values in this interval not displayed.   CBC Recent Labs  Lab 09/26/19 1427 09/26/19 1857 09/28/19 0731 09/28/19 0731 09/29/19 0323 09/29/19 0323 09/30/19 0515 09/30/19 1514 09/30/19 1520 10/01/19 0507  WBC 6.5   < > 4.5  --  4.7  --  4.4  --   --  4.2  NEUTROABS 4.7  --   --   --   --   --   --   --   --   --   HGB 8.4*   < > 7.8*   < > 8.1*   < > 8.5* 8.8*  8.5* 9.2* 8.3*  HCT 28.1*   < > 26.5*   < > 27.2*   < > 28.2* 26.0*  25.0* 27.0* 27.7*  MCV 90.4   < > 91.1  --  91.9  --  90.7  --   --  89.6  PLT 288   < > 232  --  248  --  250  --   --  252   < > = values in this interval not displayed.    Medications:    .  allopurinol  200 mg Oral QHS  . aspirin EC  81 mg Oral q AM  . atorvastatin  80 mg Oral QHS  . DULoxetine  30 mg Oral QHS  . gabapentin  100 mg Oral QHS  . heparin  5,000 Units Subcutaneous Q8H  . hydrALAZINE  10 mg Oral Q8H  . insulin aspart  0-5 Units Subcutaneous QHS  . insulin aspart  0-9 Units Subcutaneous TID WC  . insulin glargine  12 Units Subcutaneous QHS  . pantoprazole  20 mg Oral Daily  . primidone  50 mg Oral QHS  . sodium chloride flush  3 mL Intravenous Q12H  . sodium chloride flush  3 mL Intravenous Q12H  . sodium chloride flush  3 mL Intravenous Q12H   Claudia Desanctis MD 10/02/2019, 6:38 AM

## 2019-10-02 NOTE — Progress Notes (Signed)
Physical Therapy Treatment Patient Details Name: Kara Hanson MRN: [id number] DOB: [birth year] Today's Date: 10/02/2019    History of Present Illness 64 y.o. female with medical history significant of hypertension, hyperlipidemia, insulin-dependent diabetes mellitus, systolic congestive heart failure with ejection fraction of 40 to 45%, morbid obesity, CKD stage IIIb, chronic hypoxemic respiratory failure-on 4 L of oxygen via nasal cannulae at home presents to emergency department due to worsening shortness of breath, leg edema since 1 week. Admitted 6/24 Chest x-ray shows mild cardiomegaly and interstitial edema, trace right pleural effusion.Admitted 6/24 for tratment of acute on chronic CHF    PT Comments    Pt admitted with above diagnosis. Pt was able to ambulate with RW with min guard assist for the most of the walk until she did have 2 episodes of knee buckling needing min assist to recover.  Needed 4LO2 to keep sats >90%.  Will follow acutely.  Pt currently with functional limitations due to the deficits listed below (see PT Problem List). Pt will benefit from skilled PT to increase their independence and safety with mobility to allow discharge to the venue listed below.     Follow Up Recommendations  Home health PT;Supervision/Assistance - 24 hour     Equipment Recommendations  None recommended by PT (already has RW)    Recommendations for Other Services       Precautions / Restrictions Precautions Precautions: Fall Restrictions Weight Bearing Restrictions: No    Mobility  Bed Mobility Overal bed mobility: Needs Assistance Bed Mobility: Supine to Sit     Supine to sit: Min guard     General bed mobility comments: increased time and effort, but no physical assist  Transfers Overall transfer level: Needs assistance Equipment used: Rolling walker (2 wheeled) Transfers: Sit to/from Stand Sit to Stand: Min guard         General transfer comment: minguard for  safety during sit<>stands  Ambulation/Gait Ambulation/Gait assistance: Min Web designer (Feet): 320 Feet Assistive device: Rolling walker (2 wheeled) Gait Pattern/deviations: Step-through pattern;Decreased step length - right;Decreased step length - left;Shuffle;Trunk flexed Gait velocity: slowed Gait velocity interpretation: <1.31 ft/sec, indicative of household ambulator General Gait Details: min A for steadying with ambulation, pt with 2x R knee buckling requiring heavy min A for righting, pt requires standing rest break and  supplemental O2 to 4L to maintain sats >92%. Once pt in chair, nurse came in and told PT that pt needed to go to MRI.  Pt assissted to bathroom and then onto the transport chair prior to leaving for MRI.     Stairs             Wheelchair Mobility    Modified Rankin (Stroke Patients Only)       Balance Overall balance assessment: Needs assistance Sitting-balance support: No upper extremity supported;Single extremity supported Sitting balance-Leahy Scale: Good     Standing balance support: Bilateral upper extremity supported;During functional activity;Single extremity supported Standing balance-Leahy Scale: Poor Standing balance comment: Pt needed at least 1 UE support when standing at sink to wash hands.                             Cognition Arousal/Alertness: Awake/alert Behavior During Therapy: Flat affect Overall Cognitive Status: Within Functional Limits for tasks assessed  Exercises      General Comments General comments (skin integrity, edema, etc.): Needed 4LO2 for VSS      Pertinent Vitals/Pain Pain Assessment: No/denies pain    Home Living                      Prior Function            PT Goals (current goals can now be found in the care plan section) Acute Rehab PT Goals Patient Stated Goal: return home Progress towards PT goals:  Progressing toward goals    Frequency    Min 3X/week      PT Plan Current plan remains appropriate    Co-evaluation              AM-PAC PT "6 Clicks" Mobility   Outcome Measure  Help needed turning from your back to your side while in a flat bed without using bedrails?: None Help needed moving from lying on your back to sitting on the side of a flat bed without using bedrails?: A Little Help needed moving to and from a bed to a chair (including a wheelchair)?: A Little Help needed standing up from a chair using your arms (e.g., wheelchair or bedside chair)?: A Little Help needed to walk in hospital room?: A Little Help needed climbing 3-5 steps with a railing? : Total 6 Click Score: 17    End of Session Equipment Utilized During Treatment: Gait belt;Oxygen Activity Tolerance: Patient limited by fatigue Patient left: in chair (MRI techs present and wheeled pt away at end of treatment) Nurse Communication: Mobility status PT Visit Diagnosis: Unsteadiness on feet (R26.81);Other abnormalities of gait and mobility (R26.89);Muscle weakness (generalized) (M62.81);Difficulty in walking, not elsewhere classified (R26.2);Dizziness and giddiness (R42)     Time: 9476-5465 PT Time Calculation (min) (ACUTE ONLY): 21 min  Charges:  $Gait Training: 8-22 mins                     Sania Noy W,PT [place] Pager:  [phone]  Office:  [place] 10/02/2019, 12:21 PM

## 2019-10-02 NOTE — Progress Notes (Signed)
PROGRESS NOTE    Kara Hanson  LOV:[id number] DOB: 09/02/1955 DOA: 09/26/2019 PCP: [name], MD   Brief Narrative:Kara Reichardis a 64 y.o.femalewith medical history significant ofhypertension, hyperlipidemia, insulin-dependent diabetes mellitus, systolic congestive heart failure with ejection fraction of 40 to 45%, morbid obesity, CKD stage IIIb, chronic hypoxemic respiratory failure-on 4 L of oxygen via nasal cannula at home presented to emergency department due to worsening shortness of breath, leg edema since 1 week.  She also reported orthopnea, PND, weight gain of 6 pounds. Reported midsternal chest pain which is constant, 8 out of 10, nonradiating, no aggravating or relieving factors.  Questionable compliance with Lasix.  She is followed by cardiology Dr. Lorel Monaco seen in April 2021.  ED Course:Upon arrival to ED: vital signs stable. Afebrile with no leukocytosis. CBC shows anemia of chronic disease. CMP showed worsening kidney function. BNP elevated at 1420,troponin: 125, EKG shows sinus rhythm, right axis deviation, diffuse T wave inversion similar to previous EKG. No ST elevation or depression noted. COVID-19 negative, sodium 131, chloride: 97, chest x-ray shows mild cardiomegaly and interstitial edema, trace right pleural effusion. Admitted for acute on chronic systolic CHF exacerbation. Assessment & Plan:   Principal Problem:   Acute on chronic systolic CHF (congestive heart failure) (HCC) Active Problems:   Hypertension   Normochromic normocytic anemia   Type 2 diabetes mellitus with diabetic neuropathy, unspecified (HCC)   Hyperlipidemia LDL goal <70   Acute renal failure superimposed on stage 3 chronic kidney disease (HCC)   Elevated troponin   Hyponatremia   CHF exacerbation (HCC)   Cardiorenal syndrome    #1 acute on chronic combined systolic and diastolic heart failure upon admission her BNP was elevated at 1420 with chest x-ray findings  consistent with interstitial edema.  Echo with worsening ejection fraction 20 to 25%.  Currently on Lasix 120 mg twice a day followed by nephrology and cardiology. P YP scan to rule out a myeloid negative. Cardiology following on milrinone Cardiac MRI 10/01/2019-left ventricular ejection fraction 26%, right ventricular ejection fraction 46%.  #2 AKI on CKD stage IIIb no acute findings on renal ultrasound.  Continue Lasix monitor renal functions closely.  Creatinine trending down to 1.83 from 2.47 on admission  #3 chronic hypoxic respiratory failure secondary to systolic heart failure on 4 L of oxygen at home.  #4 type 2 diabetes on Lantus at bedtime 15 units.  A1c 7.1.  Decrease Lantus to 8 units nightly.  Will do benefit check on Farxiga. CBG (last 3)  Recent Labs    10/02/19 0628 10/02/19 0709 10/02/19 1607  GLUCAP 52* 87 71   #5 anemia of chronic disease hemoglobin status post Feraheme 10/01/2019 hemoglobin 8.6  #6 gout on allopurinol   Estimated body mass index is 28.69 kg/m as calculated from the following:   Height as of this encounter: 5' (1.524 m).   Weight as of this encounter: 66.6 kg.  DVT prophylaxis: Heparin subcutaneous Code Status: Full code Family Communication: No family at bedside Disposition:  Home?  Status is: Inpatient  Remains inpatient appropriate because:Inpatient level of care appropriate due to severity of illness   Dispo: The patient is from: Home  Anticipated d/c is to: Home seen by PT recommends home health PT  Anticipated d/c date is: 3 days  Patient currently is not medically stable to d/c.   Consultants:   Cardiology  Nephrology  Procedures:  None   Antimicrobials:  None  Subjective:  She is resting in bed with eyes closed does  not appear to be in any distress. Objective: Vitals:   10/01/19 1235 10/01/19 1939 10/02/19 0451 10/02/19 0459  BP: 129/69 (!) 118/58 134/60   Pulse: 70 72 84    Resp: 18 16 18    Temp: 97.7 F (36.5 C) 97.8 F (36.6 C) 98.3 F (36.8 C)   TempSrc: Oral Oral Oral   SpO2: 98% 99% 98%   Weight:    66.6 kg  Height:        Intake/Output Summary (Last 24 hours) at 10/02/2019 1520 Last data filed at 10/02/2019 0852 Gross per 24 hour  Intake 170 ml  Output 1100 ml  Net -930 ml   Filed Weights   09/30/19 0440 10/01/19 0516 10/02/19 0459  Weight: 69 kg 68.3 kg 66.6 kg    Examination:  General exam: Appears calm and comfortable  Respiratory system: Diminished breath sounds at the bases to auscultation. Respiratory effort normal. Cardiovascular system: S1 & S2 heard, RRR. No JVD, murmurs, rubs, gallops or clicks. No pedal edema. Gastrointestinal system: Abdomen is nondistended, soft and nontender. No organomegaly or masses felt. Normal bowel sounds heard. Central nervous system: Alert and oriented. No focal neurological deficits. Extremities: Trace bilateral lower extremity edema Skin: No rashes, lesions or ulcers Psychiatry: Judgement and insight appear normal. Mood & affect appropriate.     Data Reviewed: I have personally reviewed following labs and imaging studies  CBC: Recent Labs  Lab 09/26/19 1427 09/26/19 1857 09/28/19 0731 09/28/19 0731 09/29/19 0323 09/29/19 0323 09/30/19 0515 09/30/19 1514 09/30/19 1520 10/01/19 0507 10/02/19 0608  WBC 6.5   < > 4.5  --  4.7  --  4.4  --   --  4.2 5.3  NEUTROABS 4.7  --   --   --   --   --   --   --   --   --   --   HGB 8.4*   < > 7.8*   < > 8.1*   < > 8.5* 8.8*  8.5* 9.2* 8.3* 8.6*  HCT 28.1*   < > 26.5*   < > 27.2*   < > 28.2* 26.0*  25.0* 27.0* 27.7* 28.4*  MCV 90.4   < > 91.1  --  91.9  --  90.7  --   --  89.6 89.6  PLT 288   < > 232  --  248  --  250  --   --  252 247   < > = values in this interval not displayed.   Basic Metabolic Panel: Recent Labs  Lab 09/26/19 1334 09/26/19 1857 09/28/19 0731 09/28/19 0731 09/29/19 0323 09/29/19 0323 09/30/19 0515 09/30/19 1514  09/30/19 1520 10/01/19 0507 10/02/19 0608  NA 131*   < > 136   < > 133*   < > 135 135  139 136 134* 133*  K 3.8   < > 4.3   < > 3.8   < > 3.6 3.9  3.6 4.0 3.8 4.4  CL 97*   < > 100  --  98  --  98  --   --  97* 96*  CO2 24   < > 25  --  27  --  27  --   --  29 28  GLUCOSE 219*   < > 71  --  92  --  138*  --   --  77 58*  BUN 58*   < > 61*  --  61*  --  59*  --   --  55* 51*  CREATININE 2.56*   < > 2.34*  --  2.33*  --  2.01*  --   --  1.79* 1.83*  CALCIUM 8.6*   < > 8.8*  --  8.8*  --  9.0  --   --  9.1 9.4  MG 2.1  --  2.3  --   --   --   --   --   --  2.0  --    < > = values in this interval not displayed.   GFR: Estimated Creatinine Clearance: 26.4 mL/min (A) (by C-G formula based on SCr of 1.83 mg/dL (H)). Liver Function Tests: Recent Labs  Lab 09/26/19 1334  AST 20  ALT 14  ALKPHOS 93  BILITOT 0.5  PROT 5.6*  ALBUMIN 2.8*   No results for input(s): LIPASE, AMYLASE in the last 168 hours. No results for input(s): AMMONIA in the last 168 hours. Coagulation Profile: No results for input(s): INR, PROTIME in the last 168 hours. Cardiac Enzymes: No results for input(s): CKTOTAL, CKMB, CKMBINDEX, TROPONINI in the last 168 hours. BNP (last 3 results) No results for input(s): PROBNP in the last 8760 hours. HbA1C: No results for input(s): HGBA1C in the last 72 hours. CBG: Recent Labs  Lab 10/01/19 1319 10/01/19 1630 10/01/19 2132 10/02/19 0628 10/02/19 0709  GLUCAP 114* 89 144* 52* 87   Lipid Profile: No results for input(s): CHOL, HDL, LDLCALC, TRIG, CHOLHDL, LDLDIRECT in the last 72 hours. Thyroid Function Tests: No results for input(s): TSH, T4TOTAL, FREET4, T3FREE, THYROIDAB in the last 72 hours. Anemia Panel: Recent Labs    09/30/19 0937 10/02/19 0608  VITAMINB12  --  644  FOLATE  --  10.8  FERRITIN 70 132  TIBC 253 260  IRON 25* 338*  RETICCTPCT  --  3.3*   Sepsis Labs: No results for input(s): PROCALCITON, LATICACIDVEN in the last 168  hours.  Recent Results (from the past 240 hour(s))  SARS Coronavirus 2 by RT PCR (hospital order, performed in [facility] hospital lab) Nasopharyngeal Nasopharyngeal Swab     Status: None   Collection Time: 09/26/19  4:22 PM   Specimen: Nasopharyngeal Swab  Result Value Ref Range Status   SARS Coronavirus 2 NEGATIVE NEGATIVE Final    Comment: (NOTE) SARS-CoV-2 target nucleic acids are NOT DETECTED.  The SARS-CoV-2 RNA is generally detectable in upper and lower respiratory specimens during the acute phase of infection. The lowest concentration of SARS-CoV-2 viral copies this assay can detect is 250 copies / mL. A negative result does not preclude SARS-CoV-2 infection and should not be used as the sole basis for treatment or other patient management decisions.  A negative result may occur with improper specimen collection / handling, submission of specimen other than nasopharyngeal swab, presence of viral mutation(s) within the areas targeted by this assay, and inadequate number of viral copies (<250 copies / mL). A negative result must be combined with clinical observations, patient history, and epidemiological information.  Fact Sheet for Patients:   StrictlyIdeas.no  Fact Sheet for Healthcare Providers: BankingDealers.co.za  This test is not yet approved or  cleared by the [place] FDA and has been authorized for detection and/or diagnosis of SARS-CoV-2 by FDA under an Emergency Use Authorization (EUA).  This EUA will remain in effect (meaning this test can be used) for the duration of the COVID-19 declaration under Section 564(b)(1) of the Act, 21 U.S.C. section 360bbb-3(b)(1), unless the authorization is terminated or revoked sooner.  Performed at  [facility], [place] [address] [place], [place] [address]          Radiology Studies: MR CARDIAC MORPHOLOGY W WO CONTRAST  Result Date: 10/01/2019 CLINICAL DATA:   Myocardial viability evaluation, EF <30% COMPARISON: Echo 09/27/19 EXAM: CARDIAC MRI TECHNIQUE: The patient was scanned on a 1.5 Tesla GE magnet. A dedicated cardiac coil was used. Functional imaging was done using Fiesta sequences. 2,3, and 4 chamber views were done to assess for RWMA's. Modified Simpson's rule using a short axis stack was used to calculate an ejection fraction on a dedicated work Conservation officer, nature. The patient received 78mL GADAVIST GADOBUTROL 1 MMOL/ML IV SOLN. After 10 minutes inversion recovery sequences were used to assess for infiltration and scar tissue. FINDINGS: LEFT VENTRICLE: Mild-moderately dilated left ventricular chamber size. Mild concentric left ventricular hypertrophy. Maximal wall thickness 11 mm. Severely reduced left ventricular systolic function. LVEF = 26% Severe global hypokinesis. There is post contrast delayed myocardial enhancement in the inferior RV insertion point which can be seen with increased pulmonary pressure. No subendocardial delayed myocardial enhancement, myocardium appears viable. Normal T1 myocardial nulling kinetics suggest against a diagnosis of cardiac amyloidosis. ECV = 29%, which may be consistent with hypertensive heart disease. RIGHT VENTRICLE: Normal right ventricular chamber size. Normal right ventricular wall thickness. Borderline reduced right ventricular systolic function. RVEF = 46%. There are no regional wall motion abnormalities. No post contrast delayed myocardial enhancement. ATRIA: Moderately dilated left atrial chamber size. Normal right atrial size. VALVES: No significant valvular abnormalities. Tricuspid aortic valve. Flow dephasing at the aortic valve phase contrast of the aortic valve performed. Maximum systolic velocity is 0.53 m/s. Quantitation may be subject to technical image acquisition factors, and may be underestimated in the setting of LV dysfunction. PERICARDIUM: Normal pericardium.  Small pericardial effusion.  OTHER: Small-moderate bilateral pleural effusion. No other significant extracardiac findings. MEASUREMENTS: Left ventricle: LV Female LV EF: 26% (normal 56-78%) Absolute volumes: LV EDV: 119mL (Normal 52-141 mL) LV ESV: 155mL (Normal 13-51 mL) LV SV: 76mL (Normal 33-97 mL) CO: 3.1L/min (Normal 2.7-6.0 L/min) Indexed volumes: LV EDV: 180mL/sq-m (normal 41-81 mL/sq-m) LV ESV: 78mL/sq-m (Normal 12-21 mL/sq-m) LV SV: 54mL/sq-m (Normal 26-56 mL/sq-m) CI: 1.84L/min/sq-m (Normal 1.8-3.8 L/min/sq-m) Right ventricle: RV female RV EF: 46% (Normal 47-80%) Absolute volumes: RV EDV: 107 mL (Normal 58-154 mL) RV ESV: 58 mL (Normal 12-68 mL) RV SV: 49 mL (Normal 35-98 mL) CO: 3.4 L/min (Normal 2.7-6 L/min) Indexed volumes: RV EDV: 63 mL/sq-m (Normal 48-87 mL/sq-m) RV ESV: 34 mL/sq-m (Normal 11-28 mL/sq-m) RV SV: 29 mL/sq-m (Normal 27-57 mL/sq-m) CI: 2.0  L/min/sq-m (Normal 1.8-3.8 L/min/sq-m) IMPRESSION: 1. Severely reduced left ventricular systolic function. LVEF 26%. Mild-moderately enlarged LV chamber size. 2. Borderline reduced right ventricular systolic function. RVEF 46%. Normal RV chamber size. 3. No subendocardial delayed myocardial enhancement, myocardium appears viable. 4. There is post contrast delayed myocardial enhancement in the LV myocardium at the inferior RV insertion point which can be seen with increased pulmonary pressure. 5. Normal myocardial nulling kinetics, no evidence of cardiac amyloidosis. 6. ECV is 29%, which may be consistent with hypertensive heart disease. Electronically Signed   By: Cherlynn Kaiser   On: 10/01/2019 18:33        Scheduled Meds: . allopurinol  200 mg Oral QHS  . aspirin EC  81 mg Oral q AM  . atorvastatin  80 mg Oral QHS  . DULoxetine  30 mg Oral QHS  . gabapentin  100 mg Oral QHS  .  heparin  5,000 Units Subcutaneous Q8H  . hydrALAZINE  10 mg Oral Q8H  . insulin aspart  0-5 Units Subcutaneous QHS  . insulin aspart  0-9 Units Subcutaneous TID WC  . insulin glargine   12 Units Subcutaneous QHS  . pantoprazole  20 mg Oral Daily  . primidone  50 mg Oral QHS  . sodium chloride flush  3 mL Intravenous Q12H  . sodium chloride flush  3 mL Intravenous Q12H  . sodium chloride flush  3 mL Intravenous Q12H   Continuous Infusions: . sodium chloride 250 mL (10/02/19 0446)  . sodium chloride    . furosemide 120 mg (10/02/19 0447)  . milrinone 0.25 mcg/kg/min (10/02/19 0231)     LOS: 6 days      Georgette Shell, MD  10/02/2019, 3:20 PM

## 2019-10-03 LAB — GLUCOSE, CAPILLARY
Glucose-Capillary: 65 mg/dL — ABNORMAL LOW (ref 70–99)
Glucose-Capillary: 106 mg/dL — ABNORMAL HIGH (ref 70–99)
Glucose-Capillary: 100 mg/dL — ABNORMAL HIGH (ref 70–99)
Glucose-Capillary: 90 mg/dL (ref 70–99)
Glucose-Capillary: 150 mg/dL — ABNORMAL HIGH (ref 70–99)
Glucose-Capillary: 140 mg/dL — ABNORMAL HIGH (ref 70–99)

## 2019-10-03 LAB — BASIC METABOLIC PANEL
Anion gap: 10 (ref 5–15)
BUN: 49 mg/dL — ABNORMAL HIGH (ref 8–23)
CO2: 29 mmol/L (ref 22–32)
Calcium: 9.4 mg/dL (ref 8.9–10.3)
Chloride: 93 mmol/L — ABNORMAL LOW (ref 98–111)
Creatinine, Ser: 2.08 mg/dL — ABNORMAL HIGH (ref 0.44–1.00)
GFR calc Af Amer: 28 mL/min — ABNORMAL LOW (ref >60)
GFR calc non Af Amer: 25 mL/min — ABNORMAL LOW (ref >60)
Glucose, Bld: 97 mg/dL (ref 70–99)
Potassium: 4.1 mmol/L (ref 3.5–5.1)
Sodium: 132 mmol/L — ABNORMAL LOW (ref 135–145)

## 2019-10-03 MED ORDER — FUROSEMIDE 10 MG/ML IJ SOLN
80.0000 mg | Freq: Once | INTRAMUSCULAR | Status: AC
  Administered 2019-10-03: 80 mg via INTRAVENOUS
  Filled 2019-10-03: qty 8

## 2019-10-03 MED ORDER — INSULIN GLARGINE 100 UNIT/ML ~~LOC~~ SOLN
5.0000 [IU] | Freq: Every day | SUBCUTANEOUS | Status: DC
  Administered 2019-10-03 – 2019-10-06 (×4): 5 [IU] via SUBCUTANEOUS
  Filled 2019-10-03 (×5): qty 0.05

## 2019-10-03 MED ORDER — FUROSEMIDE 10 MG/ML IJ SOLN: 80.0000 mg | Freq: Every day | INTRAMUSCULAR | Status: DC

## 2019-10-03 MED ORDER — METOLAZONE 5 MG PO TABS
5.0000 mg | ORAL_TABLET | Freq: Once | ORAL | Status: AC
  Administered 2019-10-03: 5 mg via ORAL
  Filled 2019-10-03: qty 1

## 2019-10-03 MED ORDER — LOPERAMIDE HCL 2 MG PO CAPS: 2.0000 mg | ORAL_CAPSULE | Freq: Three times a day (TID) | ORAL | Status: DC | PRN

## 2019-10-03 NOTE — TOC Initial Note (Signed)
Transition of Care [facility]) - Initial/Assessment Note    Patient Details  Name: Kara Hanson MRN: [id number] Date of Birth: December 29, 1955  Transition of Care [facility]) CM/SW Contact:    Zenon Mayo, RN Phone Number: 10/03/2019, 4:05 PM  Clinical Narrative:                 NCM spoke with patient, offered choice for [facility] services, she states she has her daughter and her grand daughter with her for 24 hrs and she does not need [place] services.  NCM left agency list with her in case she changes her mind.   Expected Discharge Plan: [place] Barriers to Discharge: Continued Medical Work up   Patient Goals and CMS Choice Patient states their goals for this hospitalization and ongoing recovery are:: to get better CMS Medicare.gov Compare Post Acute Care list provided to:: Patient Choice offered to / list presented to : Patient  Expected Discharge Plan and Services Expected Discharge Plan: [place]   Discharge Planning Services: CM Consult   Living arrangements for the past 2 months: Single Family Home                   DME Agency: NA       HH Arranged: NA          Prior Living Arrangements/Services Living arrangements for the past 2 months: Single Family Home Lives with:: Adult Children, Minor Children   Do you feel safe going back to the place where you live?: Yes      Need for Family Participation in Patient Care: Yes (Comment) Care giver support system in place?: Yes (comment) Current home services: DME (has rolling walker) Criminal Activity/Legal Involvement Pertinent to Current Situation/Hospitalization: No - Comment as needed  Activities of Daily Living Home Assistive Devices/Equipment: Oxygen ADL Screening (condition at time of admission) Is the patient deaf or have difficulty hearing?: No Does the patient have difficulty seeing, even when wearing glasses/contacts?: No Does the patient have difficulty concentrating,  remembering, or making decisions?: No Does the patient have difficulty dressing or bathing?: No Does the patient have difficulty walking or climbing stairs?: Yes  Permission Sought/Granted                  Emotional Assessment Appearance:: Appears stated age Attitude/Demeanor/Rapport: Engaged Affect (typically observed): Appropriate Orientation: : Oriented to Situation, Oriented to  Time, Oriented to Place, Oriented to Self Alcohol / Substance Use: Not Applicable Psych Involvement: No (comment)  Admission diagnosis:  CHF exacerbation ([place]) [I50.9] AKI (acute kidney injury) ([place]) [N17.9] Worsening renal function [N28.9] Acute on chronic congestive heart failure, unspecified heart failure type ([place]) [I50.9] Patient Active Problem List   Diagnosis Date Noted  . Cardiorenal syndrome   . Hyponatremia 09/26/2019  . CHF exacerbation ([place]) 09/26/2019  . Acute on chronic systolic CHF (congestive heart failure) ([place]) 05/19/2019  . Acute exacerbation of CHF (congestive heart failure) ([place]) 04/18/2019  . Elevated troponin 04/18/2019  . Acute respiratory failure with hypoxia ([place]) 02/09/2019  . NSTEMI (non-ST elevated myocardial infarction) ([place]) 02/09/2019  . S/P thoracentesis   . Atelectasis   . Chronic respiratory failure ([place])   . Acute renal failure superimposed on stage 3 chronic kidney disease ([place])   . Epigastric pain   . CHF (congestive heart failure) ([place]) 01/23/2019  . CAP (community acquired pneumonia) 10/31/2018  . Acute CHF (congestive heart failure) ([place]) 09/24/2018  . SIRS (systemic inflammatory response syndrome) ([place]) 07/25/2018  .  COVID-19 virus infection 07/20/2018  . Chronic diastolic CHF (congestive heart failure) ([place]) 07/20/2018  . Pneumonia due to COVID-19 virus 07/20/2018  . Vitamin D deficiency 04/02/2018  . Spinal stenosis 03/29/2018  . Acute on chronic respiratory failure with hypoxia ([place]) 03/20/2018  . Controlled type 2 diabetes with neuropathy ([place])  03/20/2018  . CKD (chronic kidney disease), stage III 03/20/2018  . Acute on chronic combined systolic and diastolic CHF (congestive heart failure) ([place]) 11/27/2017  . AKI (acute kidney injury) ([place]) 11/27/2017  . Acute respiratory distress 11/27/2017  . Bulging lumbar disc 11/14/2017  . Gout 11/14/2017  . Hyperlipidemia LDL goal <70 09/30/2016  . Chest pain 06/17/2016  . Stress-induced cardiomyopathy 05/19/2016  . Type 2 diabetes mellitus with diabetic neuropathy, unspecified ([place]) 05/06/2016  . Vertigo 05/06/2016  . Controlled type 2 diabetes mellitus with hyperglycemia ([place]) 04/23/2016  . Hypertension 04/23/2016  . Normochromic normocytic anemia 04/23/2016  . Syncope 04/23/2016   PCP:  [name], MD Pharmacy:   [place], [place] - [address] [place] [address] Phone: [phone] Fax: [phone]     Social Determinants of Health (SDOH) Interventions    Readmission Risk Interventions Readmission Risk Prevention Plan 10/03/2019 05/22/2019 02/11/2019  Transportation Screening Complete Complete Complete  PCP or Specialist Appt within 3-5 Days - - -  Not Complete comments - - -  HRI or [place] for [place] - - -  Medication Review Press photographer) Complete Complete Complete  PCP or Specialist appointment within 3-5 days of discharge - Complete Complete  HRI or [place] Complete Complete Complete  SW Recovery Care/Counseling Consult Complete Complete Complete  Palliative Care Screening Not Applicable Not Applicable Not [place] Not Applicable Not Applicable Not Applicable  Some recent data might be hidden

## 2019-10-03 NOTE — Progress Notes (Signed)
Pt is stable. NO needs or concerns at this time. Pt is resting in bed. O2 via [place] at 4L. Will continue to monitor.

## 2019-10-03 NOTE — TOC Benefit Eligibility Note (Signed)
Transition of Care [facility]) Benefit Eligibility Note    Patient Details  Name: Kara Hanson MRN: [id number] Date of Birth: 1955/07/11   Medication/Dose: Wilder Glade 10 mg daily  Covered?: Yes  Tier: 3 Drug  Prescription Coverage [facility]: [facility]  Spoke with Person/Company/Phone Number:: Robin D. W[facility] Ph# [phone]  Co-Pay: $4.00  Prior Approval: No  Deductible:  (No Deductible)       [name] Phone Number: 10/03/2019, 4:31 PM

## 2019-10-03 NOTE — Progress Notes (Addendum)
Advanced Heart Failure Rounding Note  PCP-Cardiologist: Candee Furbish, MD   Subjective:     Wilburn Mylar continued to be  diuresed w/ 120 IV lasix BID and good response.   Creatine was down trending, now back up,  2.08.   PYP scan 10/02/2019  Equivocal for TTR amyloidosis (visual score of 1/ratio between 1-1.5). However, tracer uptake appears to be in the blood pool, and this may overestimate the ratio, contributing to equivocal results.    10/02/2019 results show Myeloma Panel appears unremarkable. Evidence of  monoclonal protein is not apparent.    Overall, pt still feels drowsy/tired . Poor appetite. Denies pain, but generalized weakness.  Is able to get out of bed and walk in the hallways w/ mild DOE which is increased activity from days prior.   Objective:   Weight Range: 65.1 kg Body mass index is 28.04 kg/m.   Vital Signs:   Temp:  [98.1 F (36.7 C)-98.3 F (36.8 C)] 98.1 F (36.7 C) (07/01 0759) Pulse Rate:  [68-84] 81 (07/01 0759) Resp:  [11-19] 11 (07/01 0759) BP: (101-154)/(50-71) 129/50 (07/01 0759) SpO2:  [98 %-100 %] 100 % (07/01 0759) Weight:  [65.1 kg] 65.1 kg (07/01 0545) Last BM Date: 09/28/19  Weight change: Filed Weights   10/01/19 0516 10/02/19 0459 10/03/19 0545  Weight: 68.3 kg 66.6 kg 65.1 kg    Intake/Output:   Intake/Output Summary (Last 24 hours) at 10/03/2019 0903 Last data filed at 10/03/2019 0527 Gross per 24 hour  Intake --  Output 1500 ml  Net -1500 ml      Physical Exam    General:  Appears chronically ill. No resp difficulty HEENT: Normal Neck: Supple. JVP  hard to assess given body habitus. . Carotids 2+ bilat; no bruits. No lymphadenopathy or thyromegaly appreciated. Cor: PMI nondisplaced. Regular rate & rhythm. Inverted T waves. No rubs, gallops or murmurs. Lungs: on 4 liters, anterior lung fields clear. R base posterior + crackles. L base posterior is diminished.  Abdomen: Soft, nontender, nondistended. No  hepatosplenomegaly. No bruits or masses. Good bowel sounds. Extremities: No cyanosis, clubbing, rash, no edema Neuro: Alert & orientedx3, cranial nerves grossly intact. moves all 4 extremities w/o difficulty. Affect flat.    Telemetry  SR 70-80s, T wave inversion  Personally reviewed .    EKG    09/30/2019: NSR , repolarization abnormality suggests, ischemia, anterolateral (ST dep, T neg, I aVL V2-V6).   Labs    CBC Recent Labs    10/01/19 0507 10/02/19 0608  WBC 4.2 5.3  HGB 8.3* 8.6*  HCT 27.7* 28.4*  MCV 89.6 89.6  PLT 252 324   Basic Metabolic Panel Recent Labs    10/01/19 0507 10/02/19 0608  NA 134* 133*  K 3.8 4.4  CL 97* 96*  CO2 29 28  GLUCOSE 77 58*  BUN 55* 51*  CREATININE 1.79* 1.83*  CALCIUM 9.1 9.4  MG 2.0  --    Liver Function Tests No results for input(s): AST, ALT, ALKPHOS, BILITOT, PROT, ALBUMIN in the last 72 hours. No results for input(s): LIPASE, AMYLASE in the last 72 hours. Cardiac Enzymes No results for input(s): CKTOTAL, CKMB, CKMBINDEX, TROPONINI in the last 72 hours.  BNP: BNP (last 3 results) Recent Labs    05/18/19 0926 07/01/19 1244 09/26/19 1427  BNP 875.8* 1,267.1* 1,420.6*    ProBNP (last 3 results) No results for input(s): PROBNP in the last 8760 hours.   D-Dimer No results for input(s): DDIMER in the last  72 hours. Hemoglobin A1C No results for input(s): HGBA1C in the last 72 hours. Fasting Lipid Panel No results for input(s): CHOL, HDL, LDLCALC, TRIG, CHOLHDL, LDLDIRECT in the last 72 hours. Thyroid Function Tests No results for input(s): TSH, T4TOTAL, T3FREE, THYROIDAB in the last 72 hours.  Invalid input(s): FREET3  Other results:   Imaging    NM CARDIAC AMYLOID TUMOR LOC INFLAM SPECT 1 DAY  Result Date: 10/02/2019 PYP scan results for amyloidosis: Scan is consistent with: Grade 1 (increased heart uptake but less than rib uptake) Heart to contralateral lung ratio is: Between 1-1.5, indeterminate for  amyloid Study is: Equivocal for TTR amyloidosis (visual score of 1/ratio between 1-1.5). However, tracer uptake appears to be in the blood pool, and this may overestimate the ratio, contributing to equivocal results.     Medications:     Scheduled Medications: . allopurinol  200 mg Oral QHS  . aspirin EC  81 mg Oral q AM  . atorvastatin  80 mg Oral QHS  . DULoxetine  30 mg Oral QHS  . gabapentin  100 mg Oral QHS  . heparin  5,000 Units Subcutaneous Q8H  . hydrALAZINE  10 mg Oral Q8H  . insulin aspart  0-5 Units Subcutaneous QHS  . insulin aspart  0-9 Units Subcutaneous TID WC  . insulin glargine  12 Units Subcutaneous QHS  . pantoprazole  20 mg Oral Daily  . primidone  50 mg Oral QHS  . sodium chloride flush  3 mL Intravenous Q12H  . sodium chloride flush  3 mL Intravenous Q12H  . sodium chloride flush  3 mL Intravenous Q12H    Infusions: . sodium chloride 250 mL (10/02/19 0446)  . sodium chloride    . furosemide 120 mg (10/03/19 0527)  . milrinone 0.25 mcg/kg/min (10/02/19 2118)    PRN Medications: sodium chloride, sodium chloride, acetaminophen, metoCLOPramide, nitroGLYCERIN, ondansetron (ZOFRAN) IV, sodium chloride flush, sodium chloride flush    Patient Profile   64 y/o female w/ h/o HTN, HLD, IDDM, Stage 3 CKD, chronic systolic and diastolic heart failure, NICM and history of poor med compliance. She is followed by Paramedicine. Multiple admissions in the last year for a/c CHF  Assessment/Plan   1. Acute on Chronic Combined Systolic and Diastolic CHF - NICM - Echo in 2018 w/ EF 30-35% (LHC w/ normal cors and normal EF by LVG 50-55%) - Echo 11/2017 & 10/2018 LVEF 50-55% - Echo 01/2019 EF 40-45% - Echo 02/2019 EF 30-35%, G3DD. RV ok. R/LHC 11/20 w/ mild CAD and normal CO - Echo 09/2019 EF 20-25%, G3DD (restrictive), RV normal.  -RHC 09/30/2019 with elevated filling pressures and low output HF - Viral cardiomyopathy as she did have COVID PNA 07/2018 and EF had been  normal prior to this - MRI 10/01/19 w/ LVEF 26%, RVEF 46%, no subendocardial delayed myocardial enhancement, ECV is 29%, and no evidence of cardiac amyloidosis. -PYP scan 10/02/2019: Equivocal for TTR amyloidosis doubt TTR given MRI negative for amyloid,  - h/o poor med compliance (followed by paramedicine) w/ multiple admissions for a/c CHF in the last 12 months - Myeloma Panel appears unremarkable. Evidence of  monoclonal protein is not apparent.  - Milrinone added post cath. Improved diuresis noted.  - Nephrology following and dosing diuretics. Overall weight down 11 pounds- no ARB/ ARNI/ Spiro/ Digoxin w/ CKD - continue hydralazine. Intolerant to Imdur  - consider SGLT2i  - needs to improve compliance w/ home meds. Continue paramedcine once discharged - not a good candidate  for advanced therapies given h/o poor compliance    2. Stage III CKD - Prior SCr in March 2021 was 1.77  - SCr on this admit elevated at 2.47. was down to 1.83, now back up to 2.08 -nephrology reccomendation: Lasix 80 mg IV once this afternoon and metolazone 5 mg PO once.  Then decrease to Lasix 80 mg IV daily for now   - renal US negative    3. IDDM  - on insulin and controlled - hgb A1c 7.1    4. Hypertension  - Improved. Continue current regimen.    5. Obesity  - Body mass index is 29.72 kg/m.  - educated on healthy diet    6. Poor Med Compliance - continue w/ paramedicine once discharged   7. Chronic Hypoxic Respiratory Failure On 4 liters prior to admit. Sats stable.   8. Anemia  -Hgb stable - iron 25 on 6/28, given Faraheme 6/29, iron 6/30 was 338   Length of Stay: [place], RN  10/03/2019, 9:03 AM  Advanced Heart Failure Team Pager [phone] (M-F; 7a - 4p)  Please contact [facility] for night-coverage after hours (4p -7a ) and weekends on [email or web address]  9:03 AM    Patient seen and examined with the above-signed Advanced Practice Provider and/or Housestaff. I personally  reviewed laboratory data, imaging studies and relevant notes. I independently examined the patient and formulated the important aspects of the plan. I have edited the note to reflect any of my changes or salient points. I have personally discussed the plan with the patient and/or family.  She remains on milrinone 0.25. has been getting IV lasix and metolazone per nephrology. Weight down 11 pounds creatinine now going back up.   She has a very flat affect and lies in bed all day. She refused OT today. Seen by PT yesterday and needed assistance getting out of bed. Was able to walk down hall with RW and minimal assist but had 2 episodes of knee buckling.   General:  Obese. Weak appearing. No resp difficulty HEENT: normal Neck: supple. no JVD. Carotids 2+ bilat; no bruits. No lymphadenopathy or thryomegaly appreciated. Cor: PMI nondisplaced. Regular rate & rhythm. No rubs, gallops or murmurs. Lungs: clear Abdomen: soft, nontender, nondistended. No hepatosplenomegaly. No bruits or masses. Good bowel sounds. Extremities: no cyanosis, clubbing, rash, edema Neuro: alert & orientedx3, cranial nerves grossly intact. moves all 4 extremities w/o difficulty. Affect flat  Renal function improved with inotropes support but now going back up. Suspect she is dry. Will stop diuretics. Would like to see renal function plateau before trying to wean milrinone.   I remain very concerned about her overall motivation to get better and her HF trajectory. She tells me that she lives with her daughter and her family and prior to admission she would cook and clean and go with her daughter to the store. I do not see any way she could do that now and she does not seem motivated to participate in rehab aggressively. Will ask Palliative Care team to see to help [place] sort out [place]. She has very advanced HF and I worry that she will feel worse once milrinone stopped.   Glori Bickers, MD  5:07 PM

## 2019-10-03 NOTE — Progress Notes (Signed)
PROGRESS NOTE    Kara Hanson  MCN:[id number] DOB: [birth year] DOA: 09/26/2019 PCP: [name], MD   Brief Narrative:Kara Hanson a 64 y.o.femalewith medical history significant ofhypertension, hyperlipidemia, insulin-dependent diabetes mellitus, systolic congestive heart failure with ejection fraction of 40 to 45%, morbid obesity, CKD stage IIIb, chronic hypoxemic respiratory failure-on 4 L of oxygen via nasal cannula at home presented to emergency department due to worsening shortness of breath, leg edema since 1 week.  She also reported orthopnea, PND, weight gain of 6 pounds. Reported midsternal chest pain which is constant, 8 out of 10, nonradiating, no aggravating or relieving factors.  Questionable compliance with Lasix.  She is followed by cardiology Dr. Lorel Monaco seen in April 2021.  ED Course:Upon arrival to ED: vital signs stable. Afebrile with no leukocytosis. CBC shows anemia of chronic disease. CMP showed worsening kidney function. BNP elevated at 1420,troponin: 125, EKG shows sinus rhythm, right axis deviation, diffuse T wave inversion similar to previous EKG. No ST elevation or depression noted. COVID-19 negative, sodium 131, chloride: 97, chest x-ray shows mild cardiomegaly and interstitial edema, trace right pleural effusion. Admitted for acute on chronic systolic CHF exacerbation. Assessment & Plan:   Principal Problem:   Acute on chronic systolic CHF (congestive heart failure) (HCC) Active Problems:   Hypertension   Normochromic normocytic anemia   Type 2 diabetes mellitus with diabetic neuropathy, unspecified (HCC)   Hyperlipidemia LDL goal <70   Acute renal failure superimposed on stage 3 chronic kidney disease (HCC)   Elevated troponin   Hyponatremia   CHF exacerbation (HCC)   Cardiorenal syndrome    #1 acute on chronic combined systolic and diastolic heart failure upon admission her BNP was elevated at 1420 with chest x-ray findings  consistent with interstitial edema.  Echo with worsening ejection fraction 20 to 25%.  Lasix dose being adjusted by nephrology.  On 80 mg of Lasix daily.   P YP scan to rule out a myeloid negative. Cardiology following on milrinone Cardiac MRI 10/01/2019-left ventricular ejection fraction 26%, right ventricular ejection fraction 46%.  #2 AKI on CKD stage IIIb no acute findings on renal ultrasound.  Continue Lasix monitor renal functions closely.  Creatinine trending up 2.08 from 1.83 yesterday.  Worsening likely related to diarrhea in addition to diuresis.    #3 chronic hypoxic respiratory failure secondary to systolic heart failure on 4 L of oxygen at home.  #4 type 2 diabetes on Lantus at bedtime 15 units.  A1c 7.1.  Decrease Lantus to 8 units nightly.  Will do benefit check on Farxiga. CBG (last 3)  Recent Labs    10/03/19 0651 10/03/19 0758 10/03/19 1122  GLUCAP 106* 100* 90    #5 anemia of chronic disease hemoglobin status post Feraheme 10/01/2019 hemoglobin 8.6  #6 gout on allopurinol  #7 diarrhea-she is not on any stool softeners.  She denies any nausea vomiting.  Some abdominal tenderness on palpation.  Will start Imodium as needed.   Estimated body mass index is 28.04 kg/m as calculated from the following:   Height as of this encounter: 5' (1.524 m).   Weight as of this encounter: 65.1 kg.  DVT prophylaxis: Heparin subcutaneous Code Status: Full code Family Communication: No family at bedside Disposition:  Home?  Status is: Inpatient  Remains inpatient appropriate because:Inpatient level of care appropriate due to severity of illness   Dispo: The patient is from: Home  Anticipated d/c is to: Home seen by PT recommends home health PT  Anticipated d/c date is:  3 days  Patient currently is not medically stable to d/c.   Consultants:   Cardiology  Nephrology  Procedures:  None   Antimicrobials:  None   Subjective:  Patient resting in bed more awake than yesterday.  She reports she has multiple bouts of diarrhea denies any abdominal pain nausea or vomiting. Objective: Vitals:   10/03/19 0537 10/03/19 0545 10/03/19 0759 10/03/19 1124  BP: (!) 137/59  (!) 129/50 (!) 150/63  Pulse: 80  81 74  Resp: 18  11 18   Temp: 98.2 F (36.8 C)  98.1 F (36.7 C) 98 F (36.7 C)  TempSrc: Oral  Oral Oral  SpO2: 100%  100% 100%  Weight:  65.1 kg    Height:        Intake/Output Summary (Last 24 hours) at 10/03/2019 1407 Last data filed at 10/03/2019 0527 Gross per 24 hour  Intake --  Output 1100 ml  Net -1100 ml   Filed Weights   10/01/19 0516 10/02/19 0459 10/03/19 0545  Weight: 68.3 kg 66.6 kg 65.1 kg    Examination:  General exam: Appears calm and comfortable  Respiratory system: Diminished breath sounds at the bases to auscultation. Respiratory effort normal. Cardiovascular system: S1 & S2 heard, RRR. No JVD, murmurs, rubs, gallops or clicks. No pedal edema. Gastrointestinal system: Abdomen is nondistended, soft and mild tender. No organomegaly or masses felt. Normal bowel sounds heard. Central nervous system: Alert and oriented. No focal neurological deficits. Extremities: Trace bilateral lower extremity edema Skin: No rashes, lesions or ulcers Psychiatry: Judgement and insight appear normal. Mood & affect appropriate.     Data Reviewed: I have personally reviewed following labs and imaging studies  CBC: Recent Labs  Lab 09/26/19 1427 09/26/19 1857 09/28/19 0731 09/28/19 0731 09/29/19 0323 09/29/19 0323 09/30/19 0515 09/30/19 1514 09/30/19 1520 10/01/19 0507 10/02/19 0608  WBC 6.5   < > 4.5  --  4.7  --  4.4  --   --  4.2 5.3  NEUTROABS 4.7  --   --   --   --   --   --   --   --   --   --   HGB 8.4*   < > 7.8*   < > 8.1*   < > 8.5* 8.8*  8.5* 9.2* 8.3* 8.6*  HCT 28.1*   < > 26.5*   < > 27.2*   < > 28.2* 26.0*  25.0* 27.0* 27.7* 28.4*  MCV 90.4   < > 91.1  --  91.9   --  90.7  --   --  89.6 89.6  PLT 288   < > 232  --  248  --  250  --   --  252 247   < > = values in this interval not displayed.   Basic Metabolic Panel: Recent Labs  Lab 09/28/19 0731 09/28/19 0731 09/29/19 0323 09/29/19 0323 09/30/19 0515 09/30/19 0515 09/30/19 1514 09/30/19 1520 10/01/19 0507 10/02/19 0608 10/03/19 0848  NA 136   < > 133*   < > 135   < > 135  139 136 134* 133* 132*  K 4.3   < > 3.8   < > 3.6   < > 3.9  3.6 4.0 3.8 4.4 4.1  CL 100   < > 98  --  98  --   --   --  97* 96* 93*  CO2 25   < > 27  --  27  --   --   --  29 28 29   GLUCOSE 71   < > 92  --  138*  --   --   --  77 58* 97  BUN 61*   < > 61*  --  59*  --   --   --  55* 51* 49*  CREATININE 2.34*   < > 2.33*  --  2.01*  --   --   --  1.79* 1.83* 2.08*  CALCIUM 8.8*   < > 8.8*  --  9.0  --   --   --  9.1 9.4 9.4  MG 2.3  --   --   --   --   --   --   --  2.0  --   --    < > = values in this interval not displayed.   GFR: Estimated Creatinine Clearance: 23 mL/min (A) (by C-G formula based on SCr of 2.08 mg/dL (H)). Liver Function Tests: No results for input(s): AST, ALT, ALKPHOS, BILITOT, PROT, ALBUMIN in the last 168 hours. No results for input(s): LIPASE, AMYLASE in the last 168 hours. No results for input(s): AMMONIA in the last 168 hours. Coagulation Profile: No results for input(s): INR, PROTIME in the last 168 hours. Cardiac Enzymes: No results for input(s): CKTOTAL, CKMB, CKMBINDEX, TROPONINI in the last 168 hours. BNP (last 3 results) No results for input(s): PROBNP in the last 8760 hours. HbA1C: No results for input(s): HGBA1C in the last 72 hours. CBG: Recent Labs  Lab 10/02/19 2146 10/03/19 0610 10/03/19 0651 10/03/19 0758 10/03/19 1122  GLUCAP 111* 65* 106* 100* 90   Lipid Profile: No results for input(s): CHOL, HDL, LDLCALC, TRIG, CHOLHDL, LDLDIRECT in the last 72 hours. Thyroid Function Tests: No results for input(s): TSH, T4TOTAL, FREET4, T3FREE, THYROIDAB in the last 72  hours. Anemia Panel: Recent Labs    10/02/19 0608  VITAMINB12 644  FOLATE 10.8  FERRITIN 132  TIBC 260  IRON 338*  RETICCTPCT 3.3*   Sepsis Labs: No results for input(s): PROCALCITON, LATICACIDVEN in the last 168 hours.  Recent Results (from the past 240 hour(s))  SARS Coronavirus 2 by RT PCR (hospital order, performed in [facility] hospital lab) Nasopharyngeal Nasopharyngeal Swab     Status: None   Collection Time: 09/26/19  4:22 PM   Specimen: Nasopharyngeal Swab  Result Value Ref Range Status   SARS Coronavirus 2 NEGATIVE NEGATIVE Final    Comment: (NOTE) SARS-CoV-2 target nucleic acids are NOT DETECTED.  The SARS-CoV-2 RNA is generally detectable in upper and lower respiratory specimens during the acute phase of infection. The lowest concentration of SARS-CoV-2 viral copies this assay can detect is 250 copies / mL. A negative result does not preclude SARS-CoV-2 infection and should not be used as the sole basis for treatment or other patient management decisions.  A negative result may occur with improper specimen collection / handling, submission of specimen other than nasopharyngeal swab, presence of viral mutation(s) within the areas targeted by this assay, and inadequate number of viral copies (<250 copies / mL). A negative result must be combined with clinical observations, patient history, and epidemiological information.  Fact Sheet for Patients:   StrictlyIdeas.no  Fact Sheet for Healthcare Providers: BankingDealers.co.za  This test is not yet approved or  cleared by the [place] FDA and has been authorized for detection and/or diagnosis of SARS-CoV-2 by FDA under an Emergency Use Authorization (EUA).  This EUA will remain in effect (meaning this test can be used) for the  duration of the COVID-19 declaration under Section 564(b)(1) of the Act, 21 U.S.C. section 360bbb-3(b)(1), unless the authorization is  terminated or revoked sooner.  Performed at [facility], [place] [address], [place], [place] [address]          Radiology Studies: NM CARDIAC AMYLOID TUMOR LOC INFLAM SPECT 1 DAY  Result Date: 10/02/2019 PYP scan results for amyloidosis: Scan is consistent with: Grade 1 (increased heart uptake but less than rib uptake) Heart to contralateral lung ratio is: Between 1-1.5, indeterminate for amyloid Study is: Equivocal for TTR amyloidosis (visual score of 1/ratio between 1-1.5). However, tracer uptake appears to be in the blood pool, and this may overestimate the ratio, contributing to equivocal results.        Scheduled Meds: . allopurinol  200 mg Oral QHS  . aspirin EC  81 mg Oral q AM  . atorvastatin  80 mg Oral QHS  . DULoxetine  30 mg Oral QHS  . furosemide  80 mg Intravenous Once  . [START ON 10/04/2019] furosemide  80 mg Intravenous Daily  . gabapentin  100 mg Oral QHS  . heparin  5,000 Units Subcutaneous Q8H  . hydrALAZINE  10 mg Oral Q8H  . insulin aspart  0-5 Units Subcutaneous QHS  . insulin aspart  0-9 Units Subcutaneous TID WC  . insulin glargine  12 Units Subcutaneous QHS  . metolazone  5 mg Oral Once  . pantoprazole  20 mg Oral Daily  . primidone  50 mg Oral QHS  . sodium chloride flush  3 mL Intravenous Q12H  . sodium chloride flush  3 mL Intravenous Q12H  . sodium chloride flush  3 mL Intravenous Q12H   Continuous Infusions: . sodium chloride 250 mL (10/02/19 0446)  . sodium chloride    . milrinone 0.25 mcg/kg/min (10/02/19 2118)     LOS: 7 days      Georgette Shell, MD  10/03/2019, 2:07 PM

## 2019-10-03 NOTE — Progress Notes (Signed)
OT Cancellation Note  Patient Details Name: Hiilani Jetter MRN: [id number] DOB: 1956-03-08   Cancelled Treatment:    Reason Eval/Treat Not Completed: Patient declined, no reason specified;Other (comment) pt reports persistent bowel freqeucies declining OOB mobility or ADLs. Will check back as time allows for OT session.  Lanier Clam., COTA/L Acute Rehabilitation Services [phone] [place] 10/03/2019, 9:33 AM

## 2019-10-03 NOTE — Progress Notes (Signed)
Patient blood sugar dropped  65 this morning asymptomatic.Hypogylcemia protocol followed and 4 ounces of ginger ale given and  blood sugar rechecked 106.Will report off to oncoming shift.

## 2019-10-03 NOTE — Progress Notes (Signed)
Inpatient Diabetes Program Recommendations  AACE/ADA: New Consensus Statement on Inpatient Glycemic Control (2015)  Target Ranges:  Prepandial:   less than 140 mg/dL      Peak postprandial:   less than 180 mg/dL (1-2 hours)      Critically ill patients:  140 - 180 mg/dL   Lab Results  Component Value Date   GLUCAP 100 (H) 10/03/2019   HGBA1C 7.1 (H) 09/26/2019    Review of Glycemic Control Results for Kara Hanson, Kara Hanson (MRN [id number]) as of 10/03/2019 11:13  Ref. Range 10/02/2019 06:28 10/02/2019 07:09 10/02/2019 16:07 10/02/2019 18:31 10/02/2019 21:46 10/03/2019 06:10 10/03/2019 06:51 10/03/2019 07:58  Glucose-Capillary Latest Ref Range: 70 - 99 mg/dL 52 (L) 87 71 153 (H) 111 (H) 65 (L) 106 (H) 100 (H)    Diabetes history:  DM2 Outpatient Diabetes medications:  Lantus 15 units daily  Novolog 0-12 units tid Current orders for Inpatient glycemic control:  Lantus 12 units qhs Novolog 0-9 units tid     Inpatient Diabetes Program Recommendations:     Decrease Lantus to 8 units qhs  Will continue to follow while inpatient.  Thank you, Reche Dixon, RN, BSN Diabetes Coordinator Inpatient Diabetes Program [phone] (team pager from 8a-5p)

## 2019-10-03 NOTE — Progress Notes (Signed)
[facility] Progress Note    Assessment/ Plan:   1.  Acute on chronic systolic CHF exacerbation: cardiology consulted.  Repeat TTE showing worsening EF, 20-25%.  Trops flat.  Coreg dose reduced in the setting of possible low- output state and was later discontinued per CHF and milrninone was started.  Weight peak of 70.1 that I see and down to 65.1 kg as of rounds 7/1. - Decrease Lasix as below - advanced heart failure consulted - appreciate their assistance; milrinone per CHF   - ordered strict ins/outs - noted CHF obtained myeloma panel- no M spike and immunofixation appears unremarkable.  There had been concern for possible amyloid or HTN cardiomyopathy. Per cMRI no evidence of cardiac amyloidosis  2.  AKI on CKD 3b: likely cardiorenal +/- hypotension.  Cr had been up to 2.5 from baseline of 1.9.  Renal US without obstruction.  - Overall has been improving with supportive care/diuresis - Repeat Lasix 80 mg IV once this afternoon and metolazone 5 mg PO once.  Then decrease to Lasix 80 mg IV daily for now    3.  Acute on chronic hypoxic RF: on O2, wean as able with vol removal.  Note she reports home requirement of 4 liters   4. normocytic anemia   - iron deficiency  - s/p feraheme 510 mg IV x 1 dose on 6/29  5.  DM II: per primary team   6.  Dispo: pending clinical improvement    Subjective:    She had 1.5 L UOP documented over 6/30.  Has gotten lasix 120 mg IV every 12 hours.  She is charted as on lasix 80 mg BID at home and tells me today that she takes lasix 80 mg daily at home.  Feels much less swollen.  Continues on milrinone and note per CHF plan to begin wean soon.   Review of systems:  Reports still short of breath with exertion but better; not at rest Denies n/v Denies chest pain    Objective:   BP (!) 129/50 (BP Location: Right Arm)   Pulse 81   Temp 98.1 F (36.7 C) (Oral)   Resp 11   Ht 5' (1.524 m)   Wt 65.1 kg   SpO2 100%   BMI 28.04  kg/m   Intake/Output Summary (Last 24 hours) at 10/03/2019 1010 Last data filed at 10/03/2019 1761 Gross per 24 hour  Intake --  Output 1500 ml  Net -1500 ml   Weight change: -1.497 kg  Physical Exam:  GEN adult female in bed in NAD HEENT EOMI sclerae nonicteric PULM on 4 liters O2, unlabored at rest, no crackles today  CV S1S2 no rub ABD soft some abd distention  EXT no pitting LE edema NEURO AAO x 3 no asterixis Psych normal mood and affect   Imaging: NM CARDIAC AMYLOID TUMOR LOC INFLAM SPECT 1 DAY  Result Date: 10/02/2019 PYP scan results for amyloidosis: Scan is consistent with: Grade 1 (increased heart uptake but less than rib uptake) Heart to contralateral lung ratio is: Between 1-1.5, indeterminate for amyloid Study is: Equivocal for TTR amyloidosis (visual score of 1/ratio between 1-1.5). However, tracer uptake appears to be in the blood pool, and this may overestimate the ratio, contributing to equivocal results.   MR CARDIAC MORPHOLOGY W WO CONTRAST  Result Date: 10/01/2019 CLINICAL DATA:  Myocardial viability evaluation, EF <30% COMPARISON: Echo 09/27/19 EXAM: CARDIAC MRI TECHNIQUE: The patient was scanned on a 1.5 Tesla GE magnet. A dedicated  cardiac coil was used. Functional imaging was done using Fiesta sequences. 2,3, and 4 chamber views were done to assess for RWMA's. Modified Simpson's rule using a short axis stack was used to calculate an ejection fraction on a dedicated work Conservation officer, nature. The patient received 8mL GADAVIST GADOBUTROL 1 MMOL/ML IV SOLN. After 10 minutes inversion recovery sequences were used to assess for infiltration and scar tissue. FINDINGS: LEFT VENTRICLE: Mild-moderately dilated left ventricular chamber size. Mild concentric left ventricular hypertrophy. Maximal wall thickness 11 mm. Severely reduced left ventricular systolic function. LVEF = 26% Severe global hypokinesis. There is post contrast delayed myocardial enhancement in the  inferior RV insertion point which can be seen with increased pulmonary pressure. No subendocardial delayed myocardial enhancement, myocardium appears viable. Normal T1 myocardial nulling kinetics suggest against a diagnosis of cardiac amyloidosis. ECV = 29%, which may be consistent with hypertensive heart disease. RIGHT VENTRICLE: Normal right ventricular chamber size. Normal right ventricular wall thickness. Borderline reduced right ventricular systolic function. RVEF = 46%. There are no regional wall motion abnormalities. No post contrast delayed myocardial enhancement. ATRIA: Moderately dilated left atrial chamber size. Normal right atrial size. VALVES: No significant valvular abnormalities. Tricuspid aortic valve. Flow dephasing at the aortic valve phase contrast of the aortic valve performed. Maximum systolic velocity is 6.38 m/s. Quantitation may be subject to technical image acquisition factors, and may be underestimated in the setting of LV dysfunction. PERICARDIUM: Normal pericardium.  Small pericardial effusion. OTHER: Small-moderate bilateral pleural effusion. No other significant extracardiac findings. MEASUREMENTS: Left ventricle: LV Female LV EF: 26% (normal 56-78%) Absolute volumes: LV EDV: 132mL (Normal 52-141 mL) LV ESV: 175mL (Normal 13-51 mL) LV SV: 43mL (Normal 33-97 mL) CO: 3.1L/min (Normal 2.7-6.0 L/min) Indexed volumes: LV EDV: 162mL/sq-m (normal 41-81 mL/sq-m) LV ESV: 79mL/sq-m (Normal 12-21 mL/sq-m) LV SV: 74mL/sq-m (Normal 26-56 mL/sq-m) CI: 1.84L/min/sq-m (Normal 1.8-3.8 L/min/sq-m) Right ventricle: RV female RV EF: 46% (Normal 47-80%) Absolute volumes: RV EDV: 107 mL (Normal 58-154 mL) RV ESV: 58 mL (Normal 12-68 mL) RV SV: 49 mL (Normal 35-98 mL) CO: 3.4 L/min (Normal 2.7-6 L/min) Indexed volumes: RV EDV: 63 mL/sq-m (Normal 48-87 mL/sq-m) RV ESV: 34 mL/sq-m (Normal 11-28 mL/sq-m) RV SV: 29 mL/sq-m (Normal 27-57 mL/sq-m) CI: 2.0  L/min/sq-m (Normal 1.8-3.8 L/min/sq-m) IMPRESSION: 1.  Severely reduced left ventricular systolic function. LVEF 26%. Mild-moderately enlarged LV chamber size. 2. Borderline reduced right ventricular systolic function. RVEF 46%. Normal RV chamber size. 3. No subendocardial delayed myocardial enhancement, myocardium appears viable. 4. There is post contrast delayed myocardial enhancement in the LV myocardium at the inferior RV insertion point which can be seen with increased pulmonary pressure. 5. Normal myocardial nulling kinetics, no evidence of cardiac amyloidosis. 6. ECV is 29%, which may be consistent with hypertensive heart disease. Electronically Signed   By: Cherlynn Kaiser   On: 10/01/2019 18:33    Labs: BMET Recent Labs  Lab 09/27/19 9373 09/27/19 4287 09/28/19 6811 09/28/19 5726 09/29/19 0323 09/30/19 0515 09/30/19 1514 09/30/19 1520 10/01/19 0507 10/02/19 0608 10/03/19 0848  NA 135   < > 136   < > 133* 135 135  139 136 134* 133* 132*  K 3.4*   < > 4.3   < > 3.8 3.6 3.9  3.6 4.0 3.8 4.4 4.1  CL 100  --  100  --  98 98  --   --  97* 96* 93*  CO2 24  --  25  --  27 27  --   --  29 28 29   GLUCOSE 154*  --  71  --  92 138*  --   --  77 58* 97  BUN 62*  --  61*  --  61* 59*  --   --  55* 51* 49*  CREATININE 2.59*  --  2.34*  --  2.33* 2.01*  --   --  1.79* 1.83* 2.08*  CALCIUM 8.7*  --  8.8*  --  8.8* 9.0  --   --  9.1 9.4 9.4   < > = values in this interval not displayed.   CBC Recent Labs  Lab 09/26/19 1427 09/26/19 1857 09/29/19 0323 09/29/19 0323 09/30/19 0515 09/30/19 0515 09/30/19 1514 09/30/19 1520 10/01/19 0507 10/02/19 0608  WBC 6.5   < > 4.7  --  4.4  --   --   --  4.2 5.3  NEUTROABS 4.7  --   --   --   --   --   --   --   --   --   HGB 8.4*   < > 8.1*   < > 8.5*   < > 8.8*  8.5* 9.2* 8.3* 8.6*  HCT 28.1*   < > 27.2*   < > 28.2*   < > 26.0*  25.0* 27.0* 27.7* 28.4*  MCV 90.4   < > 91.9  --  90.7  --   --   --  89.6 89.6  PLT 288   < > 248  --  250  --   --   --  252 247   < > = values in this interval  not displayed.    Medications:    . allopurinol  200 mg Oral QHS  . aspirin EC  81 mg Oral q AM  . atorvastatin  80 mg Oral QHS  . DULoxetine  30 mg Oral QHS  . gabapentin  100 mg Oral QHS  . heparin  5,000 Units Subcutaneous Q8H  . hydrALAZINE  10 mg Oral Q8H  . insulin aspart  0-5 Units Subcutaneous QHS  . insulin aspart  0-9 Units Subcutaneous TID WC  . insulin glargine  12 Units Subcutaneous QHS  . pantoprazole  20 mg Oral Daily  . primidone  50 mg Oral QHS  . sodium chloride flush  3 mL Intravenous Q12H  . sodium chloride flush  3 mL Intravenous Q12H  . sodium chloride flush  3 mL Intravenous Q12H   Claudia Desanctis MD 10/03/2019, 10:33 AM

## 2019-10-04 DIAGNOSIS — Z7189 Other specified counseling: Secondary | ICD-10-CM

## 2019-10-04 DIAGNOSIS — Z515 Encounter for palliative care: Secondary | ICD-10-CM

## 2019-10-04 LAB — BASIC METABOLIC PANEL
Anion gap: 11 (ref 5–15)
BUN: 50 mg/dL — ABNORMAL HIGH (ref 8–23)
CO2: 30 mmol/L (ref 22–32)
Calcium: 9.5 mg/dL (ref 8.9–10.3)
Chloride: 92 mmol/L — ABNORMAL LOW (ref 98–111)
Creatinine, Ser: 2.29 mg/dL — ABNORMAL HIGH (ref 0.44–1.00)
GFR calc Af Amer: 25 mL/min — ABNORMAL LOW (ref >60)
GFR calc non Af Amer: 22 mL/min — ABNORMAL LOW (ref >60)
Glucose, Bld: 103 mg/dL — ABNORMAL HIGH (ref 70–99)
Potassium: 4.1 mmol/L (ref 3.5–5.1)
Sodium: 133 mmol/L — ABNORMAL LOW (ref 135–145)

## 2019-10-04 LAB — CBC
HCT: 30.1 % — ABNORMAL LOW (ref 36.0–46.0)
Hemoglobin: 9.1 g/dL — ABNORMAL LOW (ref 12.0–15.0)
MCH: 27.2 pg (ref 26.0–34.0)
MCHC: 30.2 g/dL (ref 30.0–36.0)
MCV: 89.9 fL (ref 80.0–100.0)
Platelets: 286 10*3/uL (ref 150–400)
RBC: 3.35 MIL/uL — ABNORMAL LOW (ref 3.87–5.11)
RDW: 16.6 % — ABNORMAL HIGH (ref 11.5–15.5)
WBC: 5.5 10*3/uL (ref 4.0–10.5)
nRBC: 0 % (ref 0.0–0.2)

## 2019-10-04 LAB — GLUCOSE, CAPILLARY
Glucose-Capillary: 95 mg/dL (ref 70–99)
Glucose-Capillary: 119 mg/dL — ABNORMAL HIGH (ref 70–99)
Glucose-Capillary: 143 mg/dL — ABNORMAL HIGH (ref 70–99)
Glucose-Capillary: 158 mg/dL — ABNORMAL HIGH (ref 70–99)

## 2019-10-04 MED ORDER — LIDOCAINE 5 % EX PTCH
1.0000 | MEDICATED_PATCH | Freq: Every day | CUTANEOUS | Status: DC
  Administered 2019-10-04 – 2019-10-06 (×4): 1 via TRANSDERMAL
  Filled 2019-10-04 (×4): qty 1

## 2019-10-04 MED ORDER — ENSURE ENLIVE PO LIQD
237.0000 mL | Freq: Two times a day (BID) | ORAL | Status: DC
  Administered 2019-10-04 – 2019-10-07 (×4): 237 mL via ORAL

## 2019-10-04 MED ORDER — DARBEPOETIN ALFA 40 MCG/0.4ML IJ SOSY
40.0000 ug | PREFILLED_SYRINGE | Freq: Once | INTRAMUSCULAR | Status: AC
  Administered 2019-10-04: 40 ug via SUBCUTANEOUS
  Filled 2019-10-04: qty 0.4

## 2019-10-04 NOTE — Consult Note (Addendum)
Palliative Medicine Inpatient Consult Note  Reason for consult:  Goals of Care  HPI:  Per intake H&P --> Kara Hanson is a 64 y.o. female with medical history significant of hypertension, hyperlipidemia, insulin-dependent diabetes mellitus, systolic congestive heart failure with ejection fraction of 40 to 45%, morbid obesity, CKD stage IIIb, chronic hypoxemic respiratory failure-on 4 L of oxygen via nasal cannulae at home presents to emergency department due to worsening shortness of breath, leg edema since 1 week.  Palliative care was consulted in the setting of advanced heart failure initially thought to possibly be related to amyloid though this not longer seems to be the case per cMRI.   Patient is followed by the paramedicine program as an outpatient.  Clinical Assessment/Goals of Care: I have reviewed medical records including EPIC notes, labs and imaging, received report from bedside RN, assessed the patient.    I met with Kara Hanson and called her daughter Kara Hanson via telephone to further discuss diagnosis prognosis, [employer], EOL wishes, disposition and options.   I introduced Palliative Medicine as specialized medical care for people living with serious illness. It focuses on providing relief from the symptoms and stress of a serious illness. The goal is to improve quality of life for both the patient and the family.  I asked Kara - "Kara Hanson" to tell me about herself. She shares that she is originally from [place]. She moved to [place] when she was a small child due to her father becoming ill. She grew up in [place], [place]. and moved to [place] to be closer to her daughter, Kara Hanson. She is a widow, she was married for 19 five years. Her husband passed away three years ago of a heart attack. They share three children together, two sons and one daughter. Kara Hanson has eight grandchildren. She gets joy out of cooking and cleaning. She shares that she is of the [employer].     At home Kara Hanson is able to perform her bADLs though she does require the use of a walker for mobility.  I asked Kara Hanson how she had been doing at home. She shares that her health has not been "good" for awhile. She vocalizes feeling sleepy most days and not able to do anything because of ongoing weakness. She shares that when she does try to get up her knees get "wobbly". We discussed the severity of her heart failure at the present time. We talked about the relationship of the heart and kidneys and how they rely on one another for well functioning. We talked about the progressive nature of heart failure.  A detailed discussion was had today regarding advanced directives, there are non filed though patients daughter, Kara Hanson would be her surrogate decision maker if she were unable to make decisions for herself.    A MOST form was introduced. Concepts specific to code status, artifical feeding and hydration, continued IV antibiotics and rehospitalization was had. I provided a copy of these at bedside for the patient to review. She shares that she would like to be full code as she wants "to live". I shared with her the likelihood of successful resuscitation in her circumstances. She became overwhelmed and tearful. I shared that we could defer this conversation until later in the morning.   Patients daughter, Kara Hanson stated that she will be here at 1300, we plan to continue our conversation then.   Discussed the importance of continued conversation with family and their  medical providers regarding overall plan of care and  treatment options, ensuring decisions are within the context of the patients values and GOCs.  Provided "Hard Choices for [employer]" booklet.   Decision Maker: Patient is able to make decisions for herself.   SUMMARY OF RECOMMENDATIONS   Full code for the time being  Plan for family meeting today at 34 to further discuss plan moving forward. I shared with the patient and her  daughter the concern  Appreciate medical team update prior to our meeting  Code Status/Advance Care Planning: FULL CODE   Symptom Management:  Generalized Weakness:  -PT  -OT  Failure to Thrive  - Dietary consult   Palliative Prophylaxis:   Oral care, mobility  Additional Recommendations (Limitations, Scope, Preferences):  Continue current scope of care   Psycho-social/Spiritual:   Desire for further Chaplaincy support: Yes  Additional Recommendations: Education on hospice   Prognosis: Poor as it stands presently given patients known lack of compliance and tenuous state now on milrinone  Discharge Planning: Discharge to home with [facility]  Review of Systems  Respiratory: Positive for shortness of breath.   Neurological: Positive for weakness.   Vitals with BMI 10/04/2019 10/04/2019 10/03/2019  Height - - -  Weight - 142 lbs 5 oz -  BMI - 30.09 -  Systolic 233 007 622  Diastolic 67 55 59  Pulse 85 82 84   Physical Exam Vitals and nursing note reviewed.  HENT:     Head: Normocephalic.  Eyes:     Pupils: Pupils are equal, round, and reactive to light.  Cardiovascular:     Rate and Rhythm: Normal rate and regular rhythm.  Pulmonary:     Effort: Pulmonary effort is normal.  Abdominal:     Palpations: Abdomen is soft.  Musculoskeletal:     Cervical back: Normal range of motion.  Skin:    General: Skin is warm.     Capillary Refill: Capillary refill takes less than 2 seconds.  Neurological:     Mental Status: She is alert and oriented to person, place, and time.    PPS: 30%   This conversation/these recommendations were discussed with patient primary care team, Dr. Rodena Piety  Time In: 0830 Time Out: 0945 Total Time: 75 Greater than 50%  of this time was spent counseling and coordinating care related to the above assessment and plan.  [facility] Team Team Cell Phone: [phone] Please utilize secure chat with additional  questions, if there is no response within 30 minutes please call the above phone number  Palliative Medicine Team providers are available by phone from 7am to 7pm daily and can be reached through the team cell phone.  Should this patient require assistance outside of these hours, please call the patient's attending physician.  Addendum: I met with Kara Hanson's daughter, Kara Hanson at 52 we discussed her current status in regards to her severe heart failure. I shared that the cardiology team has done a thorough workup to further identify any additional causes for its accelerated course. I shared with Kara Hanson and Kara Hanson that it truly is time to consider what is important to [place]. I understand that her two year old granddaughter brings her great joy. She shares that she would like to be at home with her and not need to travel back and fourth to the hospital. We discussed hospice and what that would look like at home. I again brought the MOST form to the attention of Kara Hanson especially given the severity of her heart failure I strongly  recommended consideration of DNR code status.   Kara Hanson stated that she has two brothers who she plans on speaking with tonight. After this she would like to meet again. We plan to have a meeting tomorrow afternoon with both Kara Hanson and her two brothers to further discuss these points.  Time Spent: 45 Greater than 50%  of this time was spent counseling and coordinating care related to the above assessment and plan.

## 2019-10-04 NOTE — Progress Notes (Signed)
Occupational Therapy Treatment Patient Details Name: Kara Hanson MRN: [id number] DOB: Aug 01, 1955 Today's Date: 10/04/2019    History of present illness 64 y.o. female with medical history significant of hypertension, hyperlipidemia, insulin-dependent diabetes mellitus, systolic congestive heart failure with ejection fraction of 40 to 45%, morbid obesity, CKD stage IIIb, chronic hypoxemic respiratory failure-on 4 L of oxygen via nasal cannulae at home presents to emergency department due to worsening shortness of breath, leg edema since 1 week. Admitted 6/24 Chest x-ray shows mild cardiomegaly and interstitial edema, trace right pleural effusion.Admitted 6/24 for tratment of acute on chronic CHF   OT comments  Pt with discomfort in R cervical area currently that limits some movement for patient. Pt progressed from bed to sink then chair to eat breakfast still present in the room. Pt reports decreased pain after stretching and movement this session.   Follow Up Recommendations  No OT follow up    Equipment Recommendations  Tub/shower seat    Recommendations for Other Services      Precautions / Restrictions Precautions Precautions: Fall Precaution Comments: incontinent of bowels       Mobility Bed Mobility Overal bed mobility: Modified Independent                Transfers Overall transfer level: Needs assistance   Transfers: Sit to/from Stand Sit to Stand: Min guard              Balance                                           ADL either performed or assessed with clinical judgement   ADL Overall ADL's : Needs assistance/impaired Eating/Feeding: Set up;Sitting Eating/Feeding Details (indicate cue type and reason): needs help opening some of the containers Grooming: Wash/dry hands;Minimal assistance;Standing                 Lower Body Dressing Details (indicate cue type and reason): unable to figure 4 cross will need AE Toilet  Transfer: Minimal assistance;BSC           Functional mobility during ADLs: Minimal assistance (hand held (A)) General ADL Comments: pt educated on cervical stretch ( head toward the left and pushing with R UE ) pt reports feeling better after stretching but still has discomfort. pt continuing to stretch after session     Vision       Perception     Praxis      Cognition Arousal/Alertness: Awake/alert Behavior During Therapy: Flat affect Overall Cognitive Status: Within Functional Limits for tasks assessed                                          Exercises     Shoulder Instructions       General Comments  4 L o2 [place] tolerated well    Pertinent Vitals/ Pain       Pain Assessment: Faces Faces Pain Scale: Hurts even more Pain Location: neck Pain Descriptors / Indicators: Grimacing;Discomfort;Sharp Pain Intervention(s): Monitored during session;Repositioned;Other (comment) (has pain patch on area of R side of neck)  Home Living  Prior Functioning/Environment              Frequency  Min 2X/week        Progress Toward Goals  OT Goals(current goals can now be found in the care plan section)  Progress towards OT goals: Progressing toward goals  Acute Rehab OT Goals Patient Stated Goal: to eat OT Goal Formulation: With patient Time For Goal Achievement: 10/11/19 Potential to Achieve Goals: Good ADL Goals Pt Will Transfer to Toilet: with modified independence;ambulating Pt Will Perform Tub/Shower Transfer: Tub transfer;shower seat;with supervision  Plan Discharge plan remains appropriate;Frequency remains appropriate    Co-evaluation                 AM-PAC OT "6 Clicks" Daily Activity     Outcome Measure   Help from another person eating meals?: None Help from another person taking care of personal grooming?: A Little Help from another person toileting, which includes  using toliet, bedpan, or urinal?: A Little Help from another person bathing (including washing, rinsing, drying)?: A Little Help from another person to put on and taking off regular upper body clothing?: None Help from another person to put on and taking off regular lower body clothing?: A Lot 6 Click Score: 19    End of Session Equipment Utilized During Treatment: Oxygen  OT Visit Diagnosis: Other abnormalities of gait and mobility (R26.89);Unsteadiness on feet (R26.81)   Activity Tolerance Patient tolerated treatment well   Patient Left with call bell/phone within reach;in bed;with bed alarm set   Nurse Communication Mobility status        Time: 9211-9417 OT Time Calculation (min): 15 min  Charges: OT General Charges $OT Visit: 1 Visit OT Treatments $Self Care/Home Management : 8-22 mins   Brynn, OTR/L  Acute Rehabilitation Services Pager: [phone] Office: [phone] .    Jeri Modena 10/04/2019, 12:09 PM

## 2019-10-04 NOTE — Progress Notes (Signed)
Physical Therapy Treatment Patient Details Name: Kara Hanson MRN: [id number] DOB: 1955/12/19 Today's Date: 10/04/2019    History of Present Illness 64 y.o. female with medical history significant of hypertension, hyperlipidemia, insulin-dependent diabetes mellitus, systolic congestive heart failure with ejection fraction of 40 to 45%, morbid obesity, CKD stage IIIb, chronic hypoxemic respiratory failure-on 4 L of oxygen via nasal cannulae at home presents to emergency department due to worsening shortness of breath, leg edema since 1 week. Admitted 6/24 Chest x-ray shows mild cardiomegaly and interstitial edema, trace right pleural effusion.Admitted 6/24 for tratment of acute on chronic CHF    PT Comments    Pt in recliner with family present on entry. Pt reports she is eager to go for a walk. Pt able to perform neck stretching taught by OT this morning. Pt is m   Follow Up Recommendations  Home health PT;Supervision/Assistance - 24 hour     Equipment Recommendations  None recommended by PT (already has RW)    Recommendations for Other Services       Precautions / Restrictions Precautions Precautions: Fall Restrictions Weight Bearing Restrictions: Yes    Mobility  Bed Mobility   General bed mobility comments: OOB in recliner   Transfers Overall transfer level: Needs assistance Equipment used: Rolling walker (2 wheeled) Transfers: Sit to/from Stand Sit to Stand: Min guard         General transfer comment: minguard for safety during sit<>stands  Ambulation/Gait Ambulation/Gait assistance: Min guard;Min assist Gait Distance (Feet): 150 Feet Assistive device: Rolling walker (2 wheeled) Gait Pattern/deviations: Step-through pattern;Decreased step length - right;Decreased step length - left;Shuffle;Trunk flexed Gait velocity: slowed Gait velocity interpretation: <1.8 ft/sec, indicate of risk for recurrent falls General Gait Details: min guard for safety with  ambulation, pt with slow, mildly unsteady gait, 1x LoB with turning requiring min A for steadying         Balance Overall balance assessment: Needs assistance Sitting-balance support: No upper extremity supported;Single extremity supported Sitting balance-Leahy Scale: Good     Standing balance support: Bilateral upper extremity supported;During functional activity;Single extremity supported Standing balance-Leahy Scale: Fair Standing balance comment: able to static stand without support as gown placed on her back side                             Cognition Arousal/Alertness: Awake/alert Behavior During Therapy: Flat affect Overall Cognitive Status: Within Functional Limits for tasks assessed                                        Exercises General Exercises - Lower Extremity Long Arc Quad: AROM;Both;10 reps;Seated Hip ABduction/ADduction: AROM;Both;10 reps;Seated Hip Flexion/Marching: AROM;Both;10 reps;Seated Toe Raises: AROM;Both;10 reps;Seated Heel Raises: AROM;Both;10 reps;Seated    General Comments General comments (skin integrity, edema, etc.): 4L O2 via [place] able to maintain SaO2>90%O2      Pertinent Vitals/Pain Pain Assessment: 0-10 Pain Score: 7  Pain Location: neck Pain Descriptors / Indicators: Grimacing;Discomfort;Sharp Pain Intervention(s): Monitored during session;Repositioned;Other (comment) (pain patch coming off of R side of neck)    Home Living Family/patient expects to be discharged to:: Private residence Living Arrangements: Children Available Help at Discharge: Family;Available 24 hours/day Type of Home: House Home Access: Level entry   Home Layout: One level Home Equipment: Walker - 2 wheels      Prior Function Level of Independence: Needs assistance  Gait / Transfers Assistance Needed: uses RW at home needs a scooter at the store ADL's / [place] Needed: independent in ADL/iADLs Comments: Uses 4 L of  oxygen at home   PT Goals (current goals can now be found in the care plan section) Acute Rehab PT Goals PT Goal Formulation: With patient Time For Goal Achievement: 10/11/19 Potential to Achieve Goals: Fair    Frequency    Min 3X/week      PT Plan Current plan remains appropriate       AM-PAC PT "6 Clicks" Mobility   Outcome Measure  Help needed turning from your back to your side while in a flat bed without using bedrails?: None Help needed moving from lying on your back to sitting on the side of a flat bed without using bedrails?: A Little Help needed moving to and from a bed to a chair (including a wheelchair)?: A Little Help needed standing up from a chair using your arms (e.g., wheelchair or bedside chair)?: A Little Help needed to walk in hospital room?: A Little Help needed climbing 3-5 steps with a railing? : Total 6 Click Score: 17    End of Session Equipment Utilized During Treatment: Gait belt;Oxygen Activity Tolerance: Patient tolerated treatment well Patient left: in chair;with call bell/phone within reach Nurse Communication: Mobility status PT Visit Diagnosis: Unsteadiness on feet (R26.81);Other abnormalities of gait and mobility (R26.89);Muscle weakness (generalized) (M62.81);Difficulty in walking, not elsewhere classified (R26.2);Dizziness and giddiness (R42)     Time: 7096-4383 PT Time Calculation (min) (ACUTE ONLY): 21 min  Charges:  $Therapeutic Exercise: 8-22 mins                     Lindon Kiel B. Migdalia Dk PT, DPT Acute Rehabilitation Services Pager [phone] Office [phone]    [place] 10/04/2019, 4:59 PM

## 2019-10-04 NOTE — Progress Notes (Addendum)
PROGRESS NOTE    Kara Hanson  NLG:[id number] DOB: Nov 30, 1955 DOA: 09/26/2019 PCP: [name], MD   Brief Narrative:Kara Reichardis a 64 y.o.femalewith medical history significant ofhypertension, hyperlipidemia, insulin-dependent diabetes mellitus, systolic congestive heart failure with ejection fraction of 40 to 45%, morbid obesity, CKD stage IIIb, chronic hypoxemic respiratory failure-on 4 L of oxygen via nasal cannula at home presented to emergency department due to worsening shortness of breath, leg edema since 1 week.  She also reported orthopnea, PND, weight gain of 6 pounds. Reported midsternal chest pain which is constant, 8 out of 10, nonradiating, no aggravating or relieving factors.  Questionable compliance with Lasix.  She is followed by cardiology Dr. Lorel Monaco seen in April 2021.  ED Course:Upon arrival to ED: vital signs stable. Afebrile with no leukocytosis. CBC shows anemia of chronic disease. CMP showed worsening kidney function. BNP elevated at 1420,troponin: 125, EKG shows sinus rhythm, right axis deviation, diffuse T wave inversion similar to previous EKG. No ST elevation or depression noted. COVID-19 negative, sodium 131, chloride: 97, chest x-ray shows mild cardiomegaly and interstitial edema, trace right pleural effusion. Admitted for acute on chronic systolic CHF exacerbation. Assessment & Plan:   Principal Problem:   Acute on chronic systolic CHF (congestive heart failure) (HCC) Active Problems:   Hypertension   Normochromic normocytic anemia   Type 2 diabetes mellitus with diabetic neuropathy, unspecified (HCC)   Hyperlipidemia LDL goal <70   Acute renal failure superimposed on stage 3 chronic kidney disease (HCC)   Elevated troponin   Hyponatremia   CHF exacerbation (HCC)   Cardiorenal syndrome   Palliative care by specialist   Goals of care, counseling/discussion    #1 acute on chronic combined systolic and diastolic heart  failure upon admission her BNP was elevated at 1420 with chest x-ray findings consistent with interstitial edema.  Echo with worsening ejection fraction 20 to 25%. Viral cardiomyopathy is considered as a differential diagnosis as she had Covid pneumonia in April 2020 and prior to this her ejection fraction was normal.   Lasix dose being adjusted by nephrology.  On 80 mg of Lasix daily.   P YP scan to rule out a myeloid negative. Cardiology following on milrinone Cardiac MRI 10/01/2019-left ventricular ejection fraction 26%, right ventricular ejection fraction 46%. Remains on milrinone.  Cardiology would like to see her renal function stabilized before trying to wean milrinone.  Creatinine has been bumping up to 2.29 today from 2.08 yesterday. Discussed with her daughter [name].  Appreciate palliative care input.  #2 AKI on CKD stage IIIb no acute findings on renal ultrasound.  Continue Lasix monitor renal functions closely.  Creatinine trending up 2.29 from 2.08 from 1.83 yesterday.    #3 chronic hypoxic respiratory failure secondary to systolic heart failure on 4 L of oxygen at home.  #4 type 2 diabetes on Lantus at bedtime 15 units.  A1c 7.1.  Decrease Lantus to 8 units nightly.  Will do benefit check on Farxiga.  CBG (last 3)  Recent Labs    10/04/19 0612 10/04/19 1139 10/04/19 1557  GLUCAP 95 119* 143*    #5 anemia of chronic disease hemoglobin status post Feraheme 10/01/2019 hemoglobin 8.6  #6 gout on allopurinol  #7 diarrhea-she is not on any stool softeners.  She denies any nausea vomiting.  Some abdominal tenderness on palpation.  Diarrhea improved with Imodium.  #8 goals of care patient remains full code for now.  Daughter would like to discuss with 2 of her brothers before she can  make a better decision.  Recommended that patient should be DNR with her ongoing severe cardiomyopathy. We will continue to discuss with family.  Estimated body mass index is 27.79 kg/m as  calculated from the following:   Height as of this encounter: 5' (1.524 m).   Weight as of this encounter: 64.5 kg.  DVT prophylaxis: Heparin subcutaneous Code Status: Full code Family Communication:  Discussed with daughter [name] over the phone Disposition:  Home?  Status is: Inpatient  Remains inpatient appropriate because:Inpatient level of care appropriate due to severity of illness on IV milrinone.   Dispo: The patient is from: Home  Anticipated d/c is to: Home seen by PT recommends home health PT  Anticipated d/c date is: 3 days or more  Patient currently is not medically stable to d/c.   Consultants:   Cardiology  Nephrology  Palliative care  Procedures:  None   Antimicrobials:  None  Subjective:  Patient resting in bed more awake than yesterday.  She reports she has multiple bouts of diarrhea denies any abdominal pain nausea or vomiting. Objective: Vitals:   10/03/19 2010 10/04/19 0418 10/04/19 0805 10/04/19 1140  BP: (!) 145/59 (!) 141/55 135/67 (!) 145/67  Pulse: 84 82 85 87  Resp: 19 17 18 16   Temp: 98.6 F (37 C) 98.2 F (36.8 C) 98.1 F (36.7 C) 98.2 F (36.8 C)  TempSrc: Oral Oral Oral Oral  SpO2: 100% 98% 100% 100%  Weight:  64.5 kg    Height:        Intake/Output Summary (Last 24 hours) at 10/04/2019 1553 Last data filed at 10/04/2019 0356 Gross per 24 hour  Intake 287.76 ml  Output 1000 ml  Net -712.24 ml   Filed Weights   10/02/19 0459 10/03/19 0545 10/04/19 0418  Weight: 66.6 kg 65.1 kg 64.5 kg    Examination:  General exam: Appears calm and comfortable  Respiratory system: Diminished breath sounds at the bases to auscultation. Respiratory effort normal. Cardiovascular system: S1 & S2 heard, RRR. No JVD, murmurs, rubs, gallops or clicks. No pedal edema. Gastrointestinal system: Abdomen is nondistended, soft and mild tender. No organomegaly or masses felt. Normal bowel sounds  heard. Central nervous system: Alert and oriented. No focal neurological deficits. Extremities: Trace bilateral lower extremity edema Skin: No rashes, lesions or ulcers Psychiatry: Judgement and insight appear normal. Mood & affect appropriate.     Data Reviewed: I have personally reviewed following labs and imaging studies  CBC: Recent Labs  Lab 09/29/19 0323 09/29/19 0323 09/30/19 0515 09/30/19 0515 09/30/19 1514 09/30/19 1520 10/01/19 0507 10/02/19 0608 10/04/19 0510  WBC 4.7  --  4.4  --   --   --  4.2 5.3 5.5  HGB 8.1*   < > 8.5*   < > 8.8*  8.5* 9.2* 8.3* 8.6* 9.1*  HCT 27.2*   < > 28.2*   < > 26.0*  25.0* 27.0* 27.7* 28.4* 30.1*  MCV 91.9  --  90.7  --   --   --  89.6 89.6 89.9  PLT 248  --  250  --   --   --  252 247 286   < > = values in this interval not displayed.   Basic Metabolic Panel: Recent Labs  Lab 09/28/19 0731 09/29/19 0323 09/30/19 0515 09/30/19 1514 09/30/19 1520 10/01/19 0507 10/02/19 0608 10/03/19 0848 10/04/19 0510  NA 136   < > 135   < > 136 134* 133* 132* 133*  K 4.3   < >  3.6   < > 4.0 3.8 4.4 4.1 4.1  CL 100   < > 98  --   --  97* 96* 93* 92*  CO2 25   < > 27  --   --  29 28 29 30   GLUCOSE 71   < > 138*  --   --  77 58* 97 103*  BUN 61*   < > 59*  --   --  55* 51* 49* 50*  CREATININE 2.34*   < > 2.01*  --   --  1.79* 1.83* 2.08* 2.29*  CALCIUM 8.8*   < > 9.0  --   --  9.1 9.4 9.4 9.5  MG 2.3  --   --   --   --  2.0  --   --   --    < > = values in this interval not displayed.   GFR: Estimated Creatinine Clearance: 20.8 mL/min (A) (by C-G formula based on SCr of 2.29 mg/dL (H)). Liver Function Tests: No results for input(s): AST, ALT, ALKPHOS, BILITOT, PROT, ALBUMIN in the last 168 hours. No results for input(s): LIPASE, AMYLASE in the last 168 hours. No results for input(s): AMMONIA in the last 168 hours. Coagulation Profile: No results for input(s): INR, PROTIME in the last 168 hours. Cardiac Enzymes: No results for  input(s): CKTOTAL, CKMB, CKMBINDEX, TROPONINI in the last 168 hours. BNP (last 3 results) No results for input(s): PROBNP in the last 8760 hours. HbA1C: No results for input(s): HGBA1C in the last 72 hours. CBG: Recent Labs  Lab 10/03/19 1122 10/03/19 1619 10/03/19 2112 10/04/19 0612 10/04/19 1139  GLUCAP 90 150* 140* 95 119*   Lipid Profile: No results for input(s): CHOL, HDL, LDLCALC, TRIG, CHOLHDL, LDLDIRECT in the last 72 hours. Thyroid Function Tests: No results for input(s): TSH, T4TOTAL, FREET4, T3FREE, THYROIDAB in the last 72 hours. Anemia Panel: Recent Labs    10/02/19 0608  VITAMINB12 644  FOLATE 10.8  FERRITIN 132  TIBC 260  IRON 338*  RETICCTPCT 3.3*   Sepsis Labs: No results for input(s): PROCALCITON, LATICACIDVEN in the last 168 hours.  Recent Results (from the past 240 hour(s))  SARS Coronavirus 2 by RT PCR (hospital order, performed in [facility] hospital lab) Nasopharyngeal Nasopharyngeal Swab     Status: None   Collection Time: 09/26/19  4:22 PM   Specimen: Nasopharyngeal Swab  Result Value Ref Range Status   SARS Coronavirus 2 NEGATIVE NEGATIVE Final    Comment: (NOTE) SARS-CoV-2 target nucleic acids are NOT DETECTED.  The SARS-CoV-2 RNA is generally detectable in upper and lower respiratory specimens during the acute phase of infection. The lowest concentration of SARS-CoV-2 viral copies this assay can detect is 250 copies / mL. A negative result does not preclude SARS-CoV-2 infection and should not be used as the sole basis for treatment or other patient management decisions.  A negative result may occur with improper specimen collection / handling, submission of specimen other than nasopharyngeal swab, presence of viral mutation(s) within the areas targeted by this assay, and inadequate number of viral copies (<250 copies / mL). A negative result must be combined with clinical observations, patient history, and epidemiological  information.  Fact Sheet for Patients:   StrictlyIdeas.no  Fact Sheet for Healthcare Providers: BankingDealers.co.za  This test is not yet approved or  cleared by the [place] FDA and has been authorized for detection and/or diagnosis of SARS-CoV-2 by FDA under an Emergency Use Authorization (EUA).  This EUA will remain in effect (meaning this test can be used) for the duration of the COVID-19 declaration under Section 564(b)(1) of the Act, 21 U.S.C. section 360bbb-3(b)(1), unless the authorization is terminated or revoked sooner.  Performed at [facility], [place] [address] [place], [place] [address]          Radiology Studies: NM CARDIAC AMYLOID TUMOR LOC INFLAM SPECT 1 DAY  Result Date: 10/02/2019 PYP scan results for amyloidosis: Scan is consistent with: Grade 1 (increased heart uptake but less than rib uptake) Heart to contralateral lung ratio is: Between 1-1.5, indeterminate for amyloid Study is: Equivocal for TTR amyloidosis (visual score of 1/ratio between 1-1.5). However, tracer uptake appears to be in the blood pool, and this may overestimate the ratio, contributing to equivocal results.        Scheduled Meds: . allopurinol  200 mg Oral QHS  . aspirin EC  81 mg Oral q AM  . atorvastatin  80 mg Oral QHS  . DULoxetine  30 mg Oral QHS  . feeding supplement (ENSURE ENLIVE)  237 mL Oral BID BM  . gabapentin  100 mg Oral QHS  . heparin  5,000 Units Subcutaneous Q8H  . hydrALAZINE  10 mg Oral Q8H  . insulin aspart  0-5 Units Subcutaneous QHS  . insulin aspart  0-9 Units Subcutaneous TID WC  . insulin glargine  5 Units Subcutaneous QHS  . lidocaine  1 patch Transdermal QHS  . pantoprazole  20 mg Oral Daily  . primidone  50 mg Oral QHS  . sodium chloride flush  3 mL Intravenous Q12H  . sodium chloride flush  3 mL Intravenous Q12H  . sodium chloride flush  3 mL Intravenous Q12H   Continuous Infusions: .  sodium chloride 250 mL (10/02/19 0446)  . sodium chloride    . milrinone 0.125 mcg/kg/min (10/04/19 1502)     LOS: 8 days    Time spent 39 minutes.  Reviewed epic notes labs x-rays discussed with palliative care case manager and her daughter.  Georgette Shell, MD  10/04/2019, 3:53 PM

## 2019-10-04 NOTE — Progress Notes (Addendum)
Advanced Heart Failure Rounding Note  PCP-Cardiologist: Candee Furbish, MD   Subjective:    Remains on emperic milrinone, 0.25. No central venous access to measure co-ox or CVPs.    Diuretics held yesterday, suspected to be dry. SCr continues to trend up 1.83>>2.08>>2.29. No hypotension.    -1L in UOP yesterday. Wt down 1 lb, 143>>142 lb.   Denies CP. No dyspnea. Laying in bed, complaining of "crook" in left neck.   Palliative care following. Planning family meeting today at 1pm   Objective:   Weight Range: 64.5 kg Body mass index is 27.79 kg/m.   Vital Signs:   Temp:  [98.1 F (36.7 C)-98.6 F (37 C)] 98.2 F (36.8 C) (07/02 1140) Pulse Rate:  [81-87] 87 (07/02 1140) Resp:  [16-20] 16 (07/02 1140) BP: (127-145)/(55-67) 145/67 (07/02 1140) SpO2:  [98 %-100 %] 100 % (07/02 1140) Weight:  [64.5 kg] 64.5 kg (07/02 0418) Last BM Date: 10/03/19  Weight change: Filed Weights   10/02/19 0459 10/03/19 0545 10/04/19 0418  Weight: 66.6 kg 65.1 kg 64.5 kg    Intake/Output:   Intake/Output Summary (Last 24 hours) at 10/04/2019 1252 Last data filed at 10/04/2019 0356 Gross per 24 hour  Intake 287.76 ml  Output 1000 ml  Net -712.24 ml      Physical Exam    General:  Fatigue appearing female laying in bed. No resp difficulty HEENT: Normal Neck: Supple. Thick/short neck, JVD assessment difficult. Carotids 2+ bilat; no bruits. No lymphadenopathy or thyromegaly appreciated. Cor: PMI nondisplaced. RRR. No rubs, gallops or murmurs. Lungs: decreases BS at the bases bilaterally, no wheezing   Abdomen: Soft, nontender, nondistended. No hepatosplenomegaly. No bruits or masses. Good bowel sounds. Extremities: No cyanosis, clubbing, rash, no edema Neuro: Alert & orientedx3, cranial nerves grossly intact. moves all 4 extremities w/o difficulty. Affect flat.    Telemetry   NSR 80s    EKG    No new EKG to review   Labs    CBC Recent Labs    10/02/19 0608 10/04/19 0510    WBC 5.3 5.5  HGB 8.6* 9.1*  HCT 28.4* 30.1*  MCV 89.6 89.9  PLT 247 427   Basic Metabolic Panel Recent Labs    10/03/19 0848 10/04/19 0510  NA 132* 133*  K 4.1 4.1  CL 93* 92*  CO2 29 30  GLUCOSE 97 103*  BUN 49* 50*  CREATININE 2.08* 2.29*  CALCIUM 9.4 9.5   Liver Function Tests No results for input(s): AST, ALT, ALKPHOS, BILITOT, PROT, ALBUMIN in the last 72 hours. No results for input(s): LIPASE, AMYLASE in the last 72 hours. Cardiac Enzymes No results for input(s): CKTOTAL, CKMB, CKMBINDEX, TROPONINI in the last 72 hours.  BNP: BNP (last 3 results) Recent Labs    05/18/19 0926 07/01/19 1244 09/26/19 1427  BNP 875.8* 1,267.1* 1,420.6*    ProBNP (last 3 results) No results for input(s): PROBNP in the last 8760 hours.   D-Dimer No results for input(s): DDIMER in the last 72 hours. Hemoglobin A1C No results for input(s): HGBA1C in the last 72 hours. Fasting Lipid Panel No results for input(s): CHOL, HDL, LDLCALC, TRIG, CHOLHDL, LDLDIRECT in the last 72 hours. Thyroid Function Tests No results for input(s): TSH, T4TOTAL, T3FREE, THYROIDAB in the last 72 hours.  Invalid input(s): FREET3  Other results:   Imaging    No results found.   Medications:     Scheduled Medications: . allopurinol  200 mg Oral QHS  . aspirin  EC  81 mg Oral q AM  . atorvastatin  80 mg Oral QHS  . darbepoetin (ARANESP) injection - NON-DIALYSIS  40 mcg Subcutaneous Once  . DULoxetine  30 mg Oral QHS  . gabapentin  100 mg Oral QHS  . heparin  5,000 Units Subcutaneous Q8H  . hydrALAZINE  10 mg Oral Q8H  . insulin aspart  0-5 Units Subcutaneous QHS  . insulin aspart  0-9 Units Subcutaneous TID WC  . insulin glargine  5 Units Subcutaneous QHS  . lidocaine  1 patch Transdermal QHS  . pantoprazole  20 mg Oral Daily  . primidone  50 mg Oral QHS  . sodium chloride flush  3 mL Intravenous Q12H  . sodium chloride flush  3 mL Intravenous Q12H  . sodium chloride flush  3 mL  Intravenous Q12H    Infusions: . sodium chloride 250 mL (10/02/19 0446)  . sodium chloride    . milrinone 0.25 mcg/kg/min (10/04/19 1137)    PRN Medications: sodium chloride, sodium chloride, acetaminophen, loperamide, metoCLOPramide, nitroGLYCERIN, ondansetron (ZOFRAN) IV, sodium chloride flush, sodium chloride flush    Patient Profile   64 y/o female w/ h/o HTN, HLD, IDDM, Stage 3 CKD, chronic systolic and diastolic heart failure, NICM and history of poor med compliance. She is followed by Paramedicine. Multiple admissions in the last year for a/c CHF  Assessment/Plan   1. Acute on Chronic Combined Systolic and Diastolic CHF - NICM - Echo in 2018 w/ EF 30-35% (LHC w/ normal cors and normal EF by LVG 50-55%) - Echo 11/2017 & 10/2018 LVEF 50-55% - Echo 01/2019 EF 40-45% - Echo 02/2019 EF 30-35%, G3DD. RV ok. R/LHC 11/20 w/ mild CAD and normal CO - Echo 09/2019 EF 20-25%, G3DD (restrictive), RV normal.  -RHC 09/30/2019 with elevated filling pressures and low output HF - Viral cardiomyopathy as she did have COVID PNA 07/2018 and EF had been normal prior to this - MRI 10/01/19 w/ LVEF 26%, RVEF 46%, no subendocardial delayed myocardial enhancement, ECV is 29%, and no evidence of cardiac amyloidosis. -PYP scan 10/02/2019: Equivocal for TTR amyloidosis doubt TTR given MRI negative for amyloid,   - h/o poor med compliance (followed by paramedicine) w/ multiple admissions for a/c CHF in the last 12 months - Myeloma Panel appears unremarkable. Evidence of  monoclonal protein is not apparent.  - Milrinone added post cath. Improved diuresis noted. Remains on milrinone 0.25. Would like to see renal function plateau before trying to wean milrinone (Scr trending up, 2.08>>2.29) - Nephrology following and dosing diuretics (currently on hold for potential over diuresis). Overall weight down 12 pounds - no ARB/ ARNI/ Spiro/ Digoxin w/ CKD - continue hydralazine. Intolerant to Imdur  - consider SGLT2i.  Her GFR is > 20  - needs to improve compliance w/ home meds. Continue paramedcine once discharged - not a good candidate for advanced therapies given h/o poor compliance - she has very advanced HF and I worry that she will feel worse once milrinone stopped. - palliative care now following. Family meeting planned today to discuss [place]     2. Stage III CKD - Prior SCr in March 2021 was 1.77  - SCr on this admit elevated at 2.47. Was down to 1.83 after addition of milrinone but now back up to 2.29 - diuretics currently on hold   - renal US negative  - nephrology following    3. IDDM  - on insulin and controlled - hgb A1c 7.1    4. Hypertension  -  Improved. Continue current regimen.    5. Obesity  - Body mass index is 29.72 kg/m.  - educated on healthy diet    6. Poor Med Compliance - continue w/ paramedicine once discharged   7. Chronic Hypoxic Respiratory Failure - On 4 liters prior to admit. Sats stable.   8. Anemia  -Hgb stable - iron 25 on 6/28, given Faraheme 6/29, iron 6/30 was 338   Length of Stay: [address], PA-C  10/04/2019, 12:52 PM  Advanced Heart Failure Team Pager [phone] (M-F; 7a - 4p)  Please contact [facility] for night-coverage after hours (4p -7a ) and weekends on [email or web address]  12:52 PM   Patient seen and examined with the above-signed Advanced Practice Provider and/or Housestaff. I personally reviewed laboratory data, imaging studies and relevant notes. I independently examined the patient and formulated the important aspects of the plan. I have edited the note to reflect any of my changes or salient points. I have personally discussed the plan with the patient and/or family.  Remains on milrinone. Much more alert today than I have seen her all week now that her family is here. Sitting in chair and interactive. Creatinine continues to climb. Diuretics on hold. Has been seen by Palliative Care.  General:  Sitting in chair No resp  difficulty HEENT: normal Neck: supple. no JVD. Carotids 2+ bilat; no bruits. No lymphadenopathy or thryomegaly appreciated. Cor: PMI nondisplaced. Regular rate & rhythm. No rubs, gallops or murmurs. Lungs: clear Abdomen: soft, nontender, nondistended. No hepatosplenomegaly. No bruits or masses. Good bowel sounds. Extremities: no cyanosis, clubbing, rash, edema Neuro: alert & orientedx3, cranial nerves grossly intact. moves all 4 extremities w/o difficulty. Affect pleasant  Remains tenuous. Long discussion with her and her daughter about situation. She has advanced (and possibly end-stage) HF. Will try to wean milrinone slowly and see how she does. If we cn stabilize her symptoms and renal function of milrinone then may be able to go home early next week. If not, Hospice may be a viable option.   Glori Bickers, MD  4:54 PM

## 2019-10-04 NOTE — Progress Notes (Signed)
   10/04/19 1213  Clinical Encounter Type  Visited With Patient  Visit Type Initial;Spiritual support  Referral From Palliative care team  Consult/Referral To Chaplain  Spiritual Encounters  Spiritual Needs Prayer  This chaplain responded to PMT consult for Pt. spiritual care.  The Pt. is sitting up in the recliner at the time of the visit. The chaplain learned through reflective listening the Pt. finds meaning in her life through her ability to be present with her family. The chaplain heard the Pt. difficulty in discerning her next steps in medical care. In this journey,  the Pt. finds peace in the decision making through her faith and visualizing her mother's presence.  The Pt. shared with the chaplain her children(one daughter and two sons) are communicating with her, but have different opinions. The chaplain heard the Pt. discerning what is right, throughout the spiritual care visit.  The Pt. accepted the chaplain's invitation for prayer and F/U spiritual care.

## 2019-10-04 NOTE — Progress Notes (Signed)
[facility] Progress Note    Assessment/ Plan:   1.  Acute on chronic systolic CHF exacerbation: cardiology consulted.  Repeat TTE showing worsening EF, 20-25%.  Trops flat.  Coreg dose reduced in the setting of possible low- output state and was later discontinued per CHF and milrninone was started.  Myeloma panel- no M spike and immunofixation appears unremarkable.  There had been concern for possible amyloid or HTN cardiomyopathy. Per cMRI no evidence of cardiac amyloidosis.  Weight peak of 70.1 that I see and down to 64.5 kg as of rounds 7/2. - Lasix as below - advanced heart failure consulted - appreciate their assistance; milrinone per CHF   - ordered strict ins/outs  2.  AKI on CKD 3b: likely cardiorenal +/- hypotension.  Cr had been up to 2.5 from baseline of 1.9.  Renal US without obstruction.  - Diuretics held per CHF with rising Cr  - Follow with supportive care  3.  Acute on chronic hypoxic RF: on O2, wean as able with vol removal.  Note she reports home requirement of 4 liters   4. normocytic anemia   - iron deficiency  - s/p feraheme 510 mg IV x 1 dose on 6/29 - for aranesp 40 mcg once on 7/2 to optimize  5.  DM II: per primary team   6.  Dispo: pending clinical improvement    Subjective:    Strict ins/outs not available.  She had 1 L UOP and one unmeasured void over 7/1.  Got metolazone 5 mg PO once yesterday and lasix 120 mg IV in the AM and 80 mg IV in the afternoon.   Diuretics were stopped after the pm dose on 7/1 per CHF.  Note palliative care was consulted.  Pt states takes lasix 80 mg daily at home (not BID as charted)  Review of systems:  Reports still short of breath with exertion but better; not short of breath at rest Denies n/v Denies chest pain    Objective:   BP 135/67 (BP Location: Right Arm)   Pulse 85   Temp 98.1 F (36.7 C) (Oral)   Resp 18   Ht 5' (1.524 m)   Wt 64.5 kg   SpO2 100%   BMI 27.79 kg/m   Intake/Output  Summary (Last 24 hours) at 10/04/2019 0931 Last data filed at 10/04/2019 0356 Gross per 24 hour  Intake 287.76 ml  Output 1000 ml  Net -712.24 ml   Weight change: -0.59 kg  Physical Exam:  GEN adult female in bed in NAD  HEENT EOMI sclerae nonicteric PULM on 4 liters O2, unlabored at rest, no crackles today  CV S1S2 no rub ABD soft some abd distention  EXT no pitting LE edema NEURO AAO x 3 no asterixis Psych normal mood and affect   Imaging: NM CARDIAC AMYLOID TUMOR LOC INFLAM SPECT 1 DAY  Result Date: 10/02/2019 PYP scan results for amyloidosis: Scan is consistent with: Grade 1 (increased heart uptake but less than rib uptake) Heart to contralateral lung ratio is: Between 1-1.5, indeterminate for amyloid Study is: Equivocal for TTR amyloidosis (visual score of 1/ratio between 1-1.5). However, tracer uptake appears to be in the blood pool, and this may overestimate the ratio, contributing to equivocal results.    Labs: BMET Recent Labs  Lab 09/28/19 0731 09/28/19 1017 09/29/19 0323 09/29/19 0323 09/30/19 0515 09/30/19 1514 09/30/19 1520 10/01/19 0507 10/02/19 0608 10/03/19 0848 10/04/19 0510  NA 136   < > 133*   < >  135 135  139 136 134* 133* 132* 133*  K 4.3   < > 3.8   < > 3.6 3.9  3.6 4.0 3.8 4.4 4.1 4.1  CL 100  --  98  --  98  --   --  97* 96* 93* 92*  CO2 25  --  27  --  27  --   --  29 28 29 30   GLUCOSE 71  --  92  --  138*  --   --  77 58* 97 103*  BUN 61*  --  61*  --  59*  --   --  55* 51* 49* 50*  CREATININE 2.34*  --  2.33*  --  2.01*  --   --  1.79* 1.83* 2.08* 2.29*  CALCIUM 8.8*  --  8.8*  --  9.0  --   --  9.1 9.4 9.4 9.5   < > = values in this interval not displayed.   CBC Recent Labs  Lab 09/30/19 0515 09/30/19 1514 09/30/19 1520 10/01/19 0507 10/02/19 0608 10/04/19 0510  WBC 4.4  --   --  4.2 5.3 5.5  HGB 8.5*   < > 9.2* 8.3* 8.6* 9.1*  HCT 28.2*   < > 27.0* 27.7* 28.4* 30.1*  MCV 90.7  --   --  89.6 89.6 89.9  PLT 250  --   --  252 247  286   < > = values in this interval not displayed.    Medications:    . allopurinol  200 mg Oral QHS  . aspirin EC  81 mg Oral q AM  . atorvastatin  80 mg Oral QHS  . DULoxetine  30 mg Oral QHS  . gabapentin  100 mg Oral QHS  . heparin  5,000 Units Subcutaneous Q8H  . hydrALAZINE  10 mg Oral Q8H  . insulin aspart  0-5 Units Subcutaneous QHS  . insulin aspart  0-9 Units Subcutaneous TID WC  . insulin glargine  5 Units Subcutaneous QHS  . lidocaine  1 patch Transdermal QHS  . pantoprazole  20 mg Oral Daily  . primidone  50 mg Oral QHS  . sodium chloride flush  3 mL Intravenous Q12H  . sodium chloride flush  3 mL Intravenous Q12H  . sodium chloride flush  3 mL Intravenous Q12H   Claudia Desanctis MD 10/04/2019, 9:45 AM

## 2019-10-04 NOTE — Progress Notes (Signed)
Initial Nutrition Assessment  DOCUMENTATION CODES:   Not applicable  INTERVENTION:  Provide Ensure Enlive po BID, each supplement provides 350 kcal and 20 grams of protein.  Encourage adequate PO intake.  Diet handout regarding low sodium diet given.   NUTRITION DIAGNOSIS:   Increased nutrient needs related to chronic illness (CHF) as evidenced by estimated needs.  GOAL:   Patient will meet greater than or equal to 90% of their needs  MONITOR:   PO intake, Supplement acceptance, Skin, Weight trends, Labs, I & O's  REASON FOR ASSESSMENT:   Consult Assessment of nutrition requirement/status, Diet education  ASSESSMENT:   64 y.o. female with medical history significant of hypertension, hyperlipidemia, diabetes mellitus, systolic congestive heart failure, CKD stage IIIb, chronic hypoxemic respiratory failure presents with worsening shortness of breath and leg edema.   Pt reports having a decreased appetite which has been ongoing over the past 2 weeks. She reports trying to eat her food at meals, however has been mostly taking bites of food. Meal completion since admission has been 50-100%. RD to order nutritional supplements to aid in caloric and protein needs. Diet handout "Low Sodium Nutrition Therapy" from the Academy of Nutrition and Dietetics Manual was given. Pt reports following a low salt diet at home and reports no difficulties or questions related to the diet. Labs and medications reviewed.   NUTRITION - FOCUSED PHYSICAL EXAM:    Most Recent Value  Orbital Region No depletion  Upper Arm Region No depletion  Thoracic and Lumbar Region No depletion  Buccal Region No depletion  Temple Region No depletion  Clavicle Bone Region No depletion  Clavicle and Acromion Bone Region No depletion  Scapular Bone Region No depletion  Dorsal Hand No depletion  Patellar Region No depletion  Anterior Thigh Region No depletion  Posterior Calf Region No depletion  Edema (RD  Assessment) Mild  Hair Reviewed  Eyes Reviewed  Mouth Reviewed  Skin Reviewed  Nails Reviewed     Diet Order:   Diet Order            Diet Heart Room service appropriate? Yes; Fluid consistency: Thin  Diet effective now                 EDUCATION NEEDS:   Education needs have been addressed  Skin:  Skin Assessment: Reviewed RN Assessment  Last BM:  7/1  Height:   Ht Readings from Last 1 Encounters:  09/26/19 5' (1.524 m)    Weight:   Wt Readings from Last 1 Encounters:  10/04/19 64.5 kg   BMI:  Body mass index is 27.79 kg/m.  Estimated Nutritional Needs:   Kcal:  1610-9604  Protein:  85-100 grams  Fluid:  >/= 1.8 L/day    Corrin Parker, MS, RD, LDN RD pager number/after hours weekend pager number on Amion.

## 2019-10-05 LAB — GLUCOSE, CAPILLARY
Glucose-Capillary: 87 mg/dL (ref 70–99)
Glucose-Capillary: 185 mg/dL — ABNORMAL HIGH (ref 70–99)
Glucose-Capillary: 148 mg/dL — ABNORMAL HIGH (ref 70–99)
Glucose-Capillary: 182 mg/dL — ABNORMAL HIGH (ref 70–99)

## 2019-10-05 LAB — BASIC METABOLIC PANEL
Anion gap: 11 (ref 5–15)
BUN: 50 mg/dL — ABNORMAL HIGH (ref 8–23)
CO2: 28 mmol/L (ref 22–32)
Calcium: 9.4 mg/dL (ref 8.9–10.3)
Chloride: 93 mmol/L — ABNORMAL LOW (ref 98–111)
Creatinine, Ser: 2.33 mg/dL — ABNORMAL HIGH (ref 0.44–1.00)
GFR calc Af Amer: 25 mL/min — ABNORMAL LOW (ref >60)
GFR calc non Af Amer: 21 mL/min — ABNORMAL LOW (ref >60)
Glucose, Bld: 92 mg/dL (ref 70–99)
Potassium: 4.1 mmol/L (ref 3.5–5.1)
Sodium: 132 mmol/L — ABNORMAL LOW (ref 135–145)

## 2019-10-05 LAB — CBC
HCT: 30.9 % — ABNORMAL LOW (ref 36.0–46.0)
Hemoglobin: 9.5 g/dL — ABNORMAL LOW (ref 12.0–15.0)
MCH: 27.6 pg (ref 26.0–34.0)
MCHC: 30.7 g/dL (ref 30.0–36.0)
MCV: 89.8 fL (ref 80.0–100.0)
Platelets: 301 10*3/uL (ref 150–400)
RBC: 3.44 MIL/uL — ABNORMAL LOW (ref 3.87–5.11)
RDW: 16.6 % — ABNORMAL HIGH (ref 11.5–15.5)
WBC: 5.4 10*3/uL (ref 4.0–10.5)
nRBC: 0 % (ref 0.0–0.2)

## 2019-10-05 NOTE — Progress Notes (Signed)
Advanced Heart Failure Rounding Note  PCP-Cardiologist: Candee Furbish, MD   Subjective:    Milrinone cut back to 0.125 yesterday. Feels ok. Denies SOB, orthopnea or PND.   Diuretics on hold. Creatinine has plateaued at 2.3 Volume status ok. Weight down 2 pounds   Objective:   Weight Range: 63.9 kg Body mass index is 27.5 kg/m.   Vital Signs:   Temp:  [98.1 F (36.7 C)-98.5 F (36.9 C)] 98.5 F (36.9 C) (07/03 0418) Pulse Rate:  [85-92] 88 (07/03 0418) Resp:  [15-18] 15 (07/03 0418) BP: (135-148)/(58-67) 148/65 (07/03 0418) SpO2:  [99 %-100 %] 99 % (07/03 0418) Weight:  [63.9 kg] 63.9 kg (07/03 0418) Last BM Date: 10/03/19  Weight change: Filed Weights   10/03/19 0545 10/04/19 0418 10/05/19 0418  Weight: 65.1 kg 64.5 kg 63.9 kg    Intake/Output:   Intake/Output Summary (Last 24 hours) at 10/05/2019 0547 Last data filed at 10/05/2019 0431 Gross per 24 hour  Intake 481.72 ml  Output 550 ml  Net -68.28 ml      Physical Exam    General:  Well appearing. No resp difficulty HEENT: normal Neck: supple. no JVD. Carotids 2+ bilat; no bruits. No lymphadenopathy or thryomegaly appreciated. Cor: PMI nondisplaced. Regular rate & rhythm. No rubs, gallops or murmurs. Lungs: clear Abdomen: obese soft, nontender, nondistended. No hepatosplenomegaly. No bruits or masses. Good bowel sounds. Extremities: no cyanosis, clubbing, rash, edema Neuro: alert & orientedx3, cranial nerves grossly intact. moves all 4 extremities w/o difficulty. Affect pleasant    Telemetry   NSR 80 Personally reviewed   EKG    No new EKG to review   Labs    CBC Recent Labs    10/02/19 0608 10/04/19 0510  WBC 5.3 5.5  HGB 8.6* 9.1*  HCT 28.4* 30.1*  MCV 89.6 89.9  PLT 247 466   Basic Metabolic Panel Recent Labs    10/04/19 0510 10/05/19 0334  NA 133* 132*  K 4.1 4.1  CL 92* 93*  CO2 30 28  GLUCOSE 103* 92  BUN 50* 50*  CREATININE 2.29* 2.33*  CALCIUM 9.5 9.4   Liver  Function Tests No results for input(s): AST, ALT, ALKPHOS, BILITOT, PROT, ALBUMIN in the last 72 hours. No results for input(s): LIPASE, AMYLASE in the last 72 hours. Cardiac Enzymes No results for input(s): CKTOTAL, CKMB, CKMBINDEX, TROPONINI in the last 72 hours.  BNP: BNP (last 3 results) Recent Labs    05/18/19 0926 07/01/19 1244 09/26/19 1427  BNP 875.8* 1,267.1* 1,420.6*    ProBNP (last 3 results) No results for input(s): PROBNP in the last 8760 hours.   D-Dimer No results for input(s): DDIMER in the last 72 hours. Hemoglobin A1C No results for input(s): HGBA1C in the last 72 hours. Fasting Lipid Panel No results for input(s): CHOL, HDL, LDLCALC, TRIG, CHOLHDL, LDLDIRECT in the last 72 hours. Thyroid Function Tests No results for input(s): TSH, T4TOTAL, T3FREE, THYROIDAB in the last 72 hours.  Invalid input(s): FREET3  Other results:   Imaging    No results found.   Medications:     Scheduled Medications: . allopurinol  200 mg Oral QHS  . aspirin EC  81 mg Oral q AM  . atorvastatin  80 mg Oral QHS  . DULoxetine  30 mg Oral QHS  . feeding supplement (ENSURE ENLIVE)  237 mL Oral BID BM  . gabapentin  100 mg Oral QHS  . heparin  5,000 Units Subcutaneous Q8H  . hydrALAZINE  10 mg Oral Q8H  . insulin aspart  0-5 Units Subcutaneous QHS  . insulin aspart  0-9 Units Subcutaneous TID WC  . insulin glargine  5 Units Subcutaneous QHS  . lidocaine  1 patch Transdermal QHS  . pantoprazole  20 mg Oral Daily  . primidone  50 mg Oral QHS  . sodium chloride flush  3 mL Intravenous Q12H  . sodium chloride flush  3 mL Intravenous Q12H  . sodium chloride flush  3 mL Intravenous Q12H    Infusions: . sodium chloride 250 mL (10/02/19 0446)  . sodium chloride    . milrinone 0.125 mcg/kg/min (10/04/19 1502)    PRN Medications: sodium chloride, sodium chloride, acetaminophen, loperamide, metoCLOPramide, nitroGLYCERIN, ondansetron (ZOFRAN) IV, sodium chloride flush,  sodium chloride flush    Patient Profile   64 y/o female w/ h/o HTN, HLD, IDDM, Stage 3 CKD, chronic systolic and diastolic heart failure, NICM and history of poor med compliance. She is followed by Paramedicine. Multiple admissions in the last year for a/c CHF  Assessment/Plan   1. Acute on Chronic Combined Systolic and Diastolic CHF - NICM - Echo in 2018 w/ EF 30-35% (LHC w/ normal cors and normal EF by LVG 50-55%) - Echo 11/2017 & 10/2018 LVEF 50-55% - Echo 01/2019 EF 40-45% - Echo 02/2019 EF 30-35%, G3DD. RV ok. R/LHC 11/20 w/ mild CAD and normal CO - Echo 09/2019 EF 20-25%, G3DD (restrictive), RV normal.  -RHC 09/30/2019 with elevated filling pressures and low output HF - Viral cardiomyopathy as she did have COVID PNA 07/2018 and EF had been normal prior to this - MRI 10/01/19 w/ LVEF 26%, RVEF 46%, no subendocardial delayed myocardial enhancement, ECV is 29%, and no evidence of cardiac amyloidosis. -PYP scan 10/02/2019: Equivocal for TTR amyloidosis doubt TTR given MRI negative for amyloid,   - h/o poor med compliance (followed by paramedicine) w/ multiple admissions for a/c CHF in the last 12 months - Myeloma Panel appears unremarkable. Evidence of  monoclonal protein is not apparent.  - Milrinone added post cath. Improved diuresis noted. Remains on milrinone 0.125. Will stop today - Volume status ok. Continue to hold diuretics one more day.  - no ARB/ ARNI/ Spiro/ Digoxin w/ CKD - continue hydralazine. Intolerant to Imdur  - consider SGLT2i. Her GFR is > 20  - needs to improve compliance w/ home meds. Continue paramedcine once discharged - not a good candidate for advanced therapies given h/o poor compliance - she has very advanced HF. At high risk for recurrent decompensation off milrinone - palliative care now following. I had long discussion with patient and her daughter on 7/2. If fails milrinone wean would switch to Hospice    2. Stage III CKD - Prior SCr in March 2021 was  1.77  - SCr on this admit elevated at 2.47. Was down to 1.83 after addition of milrinone but now back up to 2.3 - diuretics currently on hold. Creatinine has plateaued - Hold diuretics one more day - renal US negative  - nephrology following    3. IDDM  - on insulin and controlled - hgb A1c 7.1    4. Hypertension  - Blood pressure well controlled. Continue current regimen.   5. Obesity  - Body mass index is 29.72 kg/m.  - educated on healthy diet    6. Poor Med Compliance - continue w/ paramedicine once discharged   7. Chronic Hypoxic Respiratory Failure - On 4 liters prior to admit. Sats stable.   8.  Anemia  -Hgb stable - iron 25 on 6/28, given Faraheme 6/29, iron 6/30 was 338   Length of Stay: 9  Glori Bickers, MD  10/05/2019, 5:47 AM  Advanced Heart Failure Team Pager [phone] (M-F; 7a - 4p)  Please contact [facility] for night-coverage after hours (4p -7a ) and weekends on [email or web address]  5:47 AM

## 2019-10-05 NOTE — Plan of Care (Signed)
  Problem: Nutrition: Goal: Adequate nutrition will be maintained Outcome: Completed/Met   Problem: Coping: Goal: Level of anxiety will decrease Outcome: Completed/Met   Problem: Elimination: Goal: Will not experience complications related to bowel motility Outcome: Completed/Met Goal: Will not experience complications related to urinary retention Outcome: Completed/Met   Problem: Safety: Goal: Ability to remain free from injury will improve Outcome: Completed/Met   Problem: Skin Integrity: Goal: Risk for impaired skin integrity will decrease Outcome: Completed/Met   

## 2019-10-05 NOTE — Progress Notes (Signed)
[facility] Progress Note    Assessment/ Plan:   1.  Acute on chronic systolic CHF exacerbation: cardiology consulted.  Repeat TTE showing worsening EF, 20-25%.  Trops flat.  Coreg dose reduced in the setting of possible low- output state and was later discontinued per CHF and milrninone was started.  Myeloma panel- no M spike and immunofixation appears unremarkable.  There had been concern for possible amyloid or HTN cardiomyopathy. Per cMRI no evidence of cardiac amyloidosis.  Weight peak of 70.1 that I see and down to 63.9 kg as of rounds 7/3.  Home regimen lasix 80 mg daily  - Pausing Lasix as below - advanced heart failure consulted - appreciate their assistance; milrinone per CHF   - per CHF recs should she fail milrinone wean they recommend transition to hospice   - strict ins/outs  2.  AKI on CKD 3b: likely cardiorenal +/- hypotension.  Cr had been up to 2.5 from baseline of 1.9.  Renal US without obstruction.  - Diuretics held and Cr appears to have plateaued - defer fluids today   - Follow with supportive care  3.  Acute on chronic hypoxic RF: on O2, wean as able with vol removal.  Note she reports home requirement of 4 liters   4. normocytic anemia   - iron deficiency  - s/p feraheme 510 mg IV x 1 dose on 6/29 - aranesp 40 mcg once on 7/2 to optimize   5.  DM II: per primary team   6.  Dispo: continue inpatient monitoring.  Palliative is following.  Per CHF recs should she fail milrinone wean they recommend transition to hospice     Subjective:    She had 550 mL UOP and one unmeasured void on 7/2.  Milrinone was stopped today.    Review of systems:   Reports still short of breath with exertion but much better than at arrival; not short of breath at rest; still not quite back to her baseline Denies n/v Denies chest pain    Objective:   BP (!) 131/55 (BP Location: Left Arm)   Pulse 88   Temp 98.2 F (36.8 C) (Oral)   Resp 18   Ht 5' (1.524 m)    Wt 63.9 kg   SpO2 97%   BMI 27.50 kg/m   Intake/Output Summary (Last 24 hours) at 10/05/2019 1242 Last data filed at 10/05/2019 0431 Gross per 24 hour  Intake 481.72 ml  Output 550 ml  Net -68.28 ml   Weight change: -0.68 kg  Physical Exam:  GEN adult female in bed in NAD   HEENT EOMI sclerae nonicteric PULM on 4 liters O2, unlabored at rest, no crackles  CV S1S2 no rub ABD soft some abd distention  EXT no pitting LE edema NEURO AAO x 3 no asterixis Psych normal mood and affect   Imaging: No results found.  Labs: BMET Recent Labs  Lab 09/29/19 0323 09/29/19 0323 09/30/19 0515 09/30/19 0515 09/30/19 1514 09/30/19 1520 10/01/19 0507 10/02/19 0608 10/03/19 0848 10/04/19 0510 10/05/19 0334  NA 133*   < > 135   < > 135  139 136 134* 133* 132* 133* 132*  K 3.8   < > 3.6   < > 3.9  3.6 4.0 3.8 4.4 4.1 4.1 4.1  CL 98  --  98  --   --   --  97* 96* 93* 92* 93*  CO2 27  --  27  --   --   --  29 28 29 30 28   GLUCOSE 92  --  138*  --   --   --  77 58* 97 103* 92  BUN 61*  --  59*  --   --   --  55* 51* 49* 50* 50*  CREATININE 2.33*  --  2.01*  --   --   --  1.79* 1.83* 2.08* 2.29* 2.33*  CALCIUM 8.8*  --  9.0  --   --   --  9.1 9.4 9.4 9.5 9.4   < > = values in this interval not displayed.   CBC Recent Labs  Lab 09/30/19 0515 09/30/19 1514 09/30/19 1520 10/01/19 0507 10/02/19 0608 10/04/19 0510  WBC 4.4  --   --  4.2 5.3 5.5  HGB 8.5*   < > 9.2* 8.3* 8.6* 9.1*  HCT 28.2*   < > 27.0* 27.7* 28.4* 30.1*  MCV 90.7  --   --  89.6 89.6 89.9  PLT 250  --   --  252 247 286   < > = values in this interval not displayed.    Medications:    . allopurinol  200 mg Oral QHS  . aspirin EC  81 mg Oral q AM  . atorvastatin  80 mg Oral QHS  . DULoxetine  30 mg Oral QHS  . feeding supplement (ENSURE ENLIVE)  237 mL Oral BID BM  . gabapentin  100 mg Oral QHS  . heparin  5,000 Units Subcutaneous Q8H  . hydrALAZINE  10 mg Oral Q8H  . insulin aspart  0-5 Units Subcutaneous  QHS  . insulin aspart  0-9 Units Subcutaneous TID WC  . insulin glargine  5 Units Subcutaneous QHS  . lidocaine  1 patch Transdermal QHS  . pantoprazole  20 mg Oral Daily  . primidone  50 mg Oral QHS  . sodium chloride flush  3 mL Intravenous Q12H  . sodium chloride flush  3 mL Intravenous Q12H  . sodium chloride flush  3 mL Intravenous Q12H   Claudia Desanctis MD 10/05/2019, 12:55 PM

## 2019-10-05 NOTE — Progress Notes (Signed)
   Palliative Medicine Inpatient Follow Up Note   Palliative care plans to conduct a family meeting tomorrow with patients daughter and two sons. This will be completed at 1700 tomorrow due to family preference.   Meeting objectives include addressing code status and appropriateness for hospice enrollment.   Time Spent: No Charge ______________________________________________________________________________________ [facility] Team Team Cell Phone: [phone] Please utilize secure chat with additional questions, if there is no response within 30 minutes please call the above phone number  Palliative Medicine Team providers are available by phone from 7am to 7pm daily and can be reached through the team cell phone.  Should this patient require assistance outside of these hours, please call the patient's attending physician.

## 2019-10-05 NOTE — Progress Notes (Addendum)
PROGRESS NOTE    Kara Hanson  GBT:[id number] DOB: 1955/11/16 DOA: 09/26/2019 PCP: [name], MD   Brief Narrative:Kara Reichardis a 64 y.o.femalewith medical history significant ofhypertension, hyperlipidemia, insulin-dependent diabetes mellitus, systolic congestive heart failure with ejection fraction of 40 to 45%, morbid obesity, CKD stage IIIb, chronic hypoxemic respiratory failure-on 4 L of oxygen via nasal cannula at home presented to emergency department due to worsening shortness of breath, leg edema since 1 week.  She also reported orthopnea, PND, weight gain of 6 pounds. Reported midsternal chest pain which is constant, 8 out of 10, nonradiating, no aggravating or relieving factors.  Questionable compliance with Lasix.  She is followed by cardiology Dr. Lorel Monaco seen in April 2021.  ED Course:Upon arrival to ED: vital signs stable. Afebrile with no leukocytosis. CBC shows anemia of chronic disease. CMP showed worsening kidney function. BNP elevated at 1420,troponin: 125, EKG shows sinus rhythm, right axis deviation, diffuse T wave inversion similar to previous EKG. No ST elevation or depression noted. COVID-19 negative, sodium 131, chloride: 97, chest x-ray shows mild cardiomegaly and interstitial edema, trace right pleural effusion. Admitted for acute on chronic systolic CHF exacerbation. Assessment & Plan:   Principal Problem:   Acute on chronic systolic CHF (congestive heart failure) (HCC) Active Problems:   Hypertension   Normochromic normocytic anemia   Type 2 diabetes mellitus with diabetic neuropathy, unspecified (HCC)   Hyperlipidemia LDL goal <70   Acute renal failure superimposed on stage 3 chronic kidney disease (HCC)   Elevated troponin   Hyponatremia   CHF exacerbation (HCC)   Cardiorenal syndrome   Palliative care by specialist   Goals of care, counseling/discussion    #1 acute on chronic combined systolic and diastolic heart  failure upon admission her BNP was elevated at 1420 with chest x-ray findings consistent with interstitial edema.  Echo with worsening ejection fraction 20 to 25%. Viral cardiomyopathy is considered as a differential diagnosis as she had Covid pneumonia in April 2020 and prior to this her ejection fraction was normal.   Lasix dose being adjusted by nephrology.  On 80 mg of Lasix daily.   P YP scan to rule out a myeloid negative. Cardiology following on milrinone dose decreased today. Cardiac MRI 10/01/2019-left ventricular ejection fraction 26%, right ventricular ejection fraction 46%. Remains on milrinone.  Cardiology would like to see her renal function stabilized before trying to wean milrinone.  Creatinine has been trending up to 2.33 from 2.29 today from 2.08 yesterday. Discussed with her daughter Kara Hanson.  Appreciate palliative care input.  #2 AKI on CKD stage IIIb no acute findings on renal ultrasound.  Continue Lasix monitor renal functions closely.  Creatinine trending up 2.33 from 2.29 from 2.08 from 1.83 yesterday.    #3 chronic hypoxic respiratory failure secondary to systolic heart failure on 4 L of oxygen at home.  #4 type 2 diabetes-Lantus decreased to 8 units continue the same. Case manager doing benefit check on farxiga CBG (last 3)  Recent Labs    10/04/19 2106 10/05/19 0615 10/05/19 1136  GLUCAP 158* 87 185*    #5 anemia of chronic disease hemoglobin status post Feraheme 10/01/2019 hemoglobin 8.6  #6 gout on allopurinol  #7 diarrhea-she is not on any stool softeners.  She denies any nausea vomiting.  Some abdominal tenderness on palpation.  Diarrhea improved with Imodium.  #8 goals of care patient remains full code for now.  Daughter would like to discuss with 2 of her brothers before she can make a better  decision.  Recommended that patient should be DNR with her ongoing severe cardiomyopathy. We will continue to discuss with family.  Estimated body mass index is  27.5 kg/m as calculated from the following:   Height as of this encounter: 5' (1.524 m).   Weight as of this encounter: 63.9 kg.  DVT prophylaxis: Heparin subcutaneous Code Status: Full code Family Communication:  Discussed with daughter Kara Hanson over the phone Disposition:  Home?  Status is: Inpatient  Remains inpatient appropriate because:Inpatient level of care appropriate due to severity of illness on IV milrinone.   Dispo: The patient is from: Home  Anticipated d/c is to: Home seen by PT recommends home health PT  Anticipated d/c date is: 3 days or more  Patient currently is not medically stable to d/c.   Consultants:   Cardiology  Nephrology  Palliative care  Procedures:  None   Antimicrobials:  None  Subjective:  Patient reports she is feeling very tired and weak  Denies chest pain continues to be short of breath Not slept well   objective: Vitals:   10/04/19 1950 10/05/19 0418 10/05/19 0827 10/05/19 1137  BP: (!) 139/58 (!) 148/65 137/65 (!) 131/55  Pulse: 92 88 88 88  Resp: 18 15 18 18   Temp: 98.2 F (36.8 C) 98.5 F (36.9 C) (!) 97.3 F (36.3 C) 98.2 F (36.8 C)  TempSrc: Oral Oral Oral Oral  SpO2: 100% 99% 100% 97%  Weight:  63.9 kg    Height:        Intake/Output Summary (Last 24 hours) at 10/05/2019 1337 Last data filed at 10/05/2019 1300 Gross per 24 hour  Intake 481.72 ml  Output 1350 ml  Net -868.28 ml   Filed Weights   10/03/19 0545 10/04/19 0418 10/05/19 0418  Weight: 65.1 kg 64.5 kg 63.9 kg    Examination:  General exam: Appears calm and comfortable  Respiratory system: Diminished breath sounds at the bases to auscultation. Respiratory effort normal. Cardiovascular system: S1 & S2 heard, RRR. No JVD, murmurs, rubs, gallops or clicks. No pedal edema. Gastrointestinal system: Abdomen is nondistended, soft and mild tender. No organomegaly or masses felt. Normal bowel sounds heard. Central  nervous system: Alert and oriented. No focal neurological deficits. Extremities: Trace bilateral lower extremity edema Skin: No rashes, lesions or ulcers Psychiatry: Judgement and insight appear normal. Mood & affect appropriate.     Data Reviewed: I have personally reviewed following labs and imaging studies  CBC: Recent Labs  Lab 09/29/19 0323 09/29/19 0323 09/30/19 0515 09/30/19 0515 09/30/19 1514 09/30/19 1520 10/01/19 0507 10/02/19 0608 10/04/19 0510  WBC 4.7  --  4.4  --   --   --  4.2 5.3 5.5  HGB 8.1*   < > 8.5*   < > 8.8*  8.5* 9.2* 8.3* 8.6* 9.1*  HCT 27.2*   < > 28.2*   < > 26.0*  25.0* 27.0* 27.7* 28.4* 30.1*  MCV 91.9  --  90.7  --   --   --  89.6 89.6 89.9  PLT 248  --  250  --   --   --  252 247 286   < > = values in this interval not displayed.   Basic Metabolic Panel: Recent Labs  Lab 10/01/19 0507 10/02/19 0608 10/03/19 0848 10/04/19 0510 10/05/19 0334  NA 134* 133* 132* 133* 132*  K 3.8 4.4 4.1 4.1 4.1  CL 97* 96* 93* 92* 93*  CO2 29 28 29 30 28   GLUCOSE 77  58* 97 103* 92  BUN 55* 51* 49* 50* 50*  CREATININE 1.79* 1.83* 2.08* 2.29* 2.33*  CALCIUM 9.1 9.4 9.4 9.5 9.4  MG 2.0  --   --   --   --    GFR: Estimated Creatinine Clearance: 20.4 mL/min (A) (by C-G formula based on SCr of 2.33 mg/dL (H)). Liver Function Tests: No results for input(s): AST, ALT, ALKPHOS, BILITOT, PROT, ALBUMIN in the last 168 hours. No results for input(s): LIPASE, AMYLASE in the last 168 hours. No results for input(s): AMMONIA in the last 168 hours. Coagulation Profile: No results for input(s): INR, PROTIME in the last 168 hours. Cardiac Enzymes: No results for input(s): CKTOTAL, CKMB, CKMBINDEX, TROPONINI in the last 168 hours. BNP (last 3 results) No results for input(s): PROBNP in the last 8760 hours. HbA1C: No results for input(s): HGBA1C in the last 72 hours. CBG: Recent Labs  Lab 10/04/19 1139 10/04/19 1557 10/04/19 2106 10/05/19 0615 10/05/19 1136    GLUCAP 119* 143* 158* 87 185*   Lipid Profile: No results for input(s): CHOL, HDL, LDLCALC, TRIG, CHOLHDL, LDLDIRECT in the last 72 hours. Thyroid Function Tests: No results for input(s): TSH, T4TOTAL, FREET4, T3FREE, THYROIDAB in the last 72 hours. Anemia Panel: No results for input(s): VITAMINB12, FOLATE, FERRITIN, TIBC, IRON, RETICCTPCT in the last 72 hours. Sepsis Labs: No results for input(s): PROCALCITON, LATICACIDVEN in the last 168 hours.  Recent Results (from the past 240 hour(s))  SARS Coronavirus 2 by RT PCR (hospital order, performed in [facility] hospital lab) Nasopharyngeal Nasopharyngeal Swab     Status: None   Collection Time: 09/26/19  4:22 PM   Specimen: Nasopharyngeal Swab  Result Value Ref Range Status   SARS Coronavirus 2 NEGATIVE NEGATIVE Final    Comment: (NOTE) SARS-CoV-2 target nucleic acids are NOT DETECTED.  The SARS-CoV-2 RNA is generally detectable in upper and lower respiratory specimens during the acute phase of infection. The lowest concentration of SARS-CoV-2 viral copies this assay can detect is 250 copies / mL. A negative result does not preclude SARS-CoV-2 infection and should not be used as the sole basis for treatment or other patient management decisions.  A negative result may occur with improper specimen collection / handling, submission of specimen other than nasopharyngeal swab, presence of viral mutation(s) within the areas targeted by this assay, and inadequate number of viral copies (<250 copies / mL). A negative result must be combined with clinical observations, patient history, and epidemiological information.  Fact Sheet for Patients:   StrictlyIdeas.no  Fact Sheet for Healthcare Providers: BankingDealers.co.za  This test is not yet approved or  cleared by the [place] FDA and has been authorized for detection and/or diagnosis of SARS-CoV-2 by FDA under an Emergency Use  Authorization (EUA).  This EUA will remain in effect (meaning this test can be used) for the duration of the COVID-19 declaration under Section 564(b)(1) of the Act, 21 U.S.C. section 360bbb-3(b)(1), unless the authorization is terminated or revoked sooner.  Performed at [facility], [place] [address] [place], [place] [address]          Radiology Studies: No results found.      Scheduled Meds: . allopurinol  200 mg Oral QHS  . aspirin EC  81 mg Oral q AM  . atorvastatin  80 mg Oral QHS  . DULoxetine  30 mg Oral QHS  . feeding supplement (ENSURE ENLIVE)  237 mL Oral BID BM  . gabapentin  100 mg Oral QHS  .  heparin  5,000 Units Subcutaneous Q8H  . hydrALAZINE  10 mg Oral Q8H  . insulin aspart  0-5 Units Subcutaneous QHS  . insulin aspart  0-9 Units Subcutaneous TID WC  . insulin glargine  5 Units Subcutaneous QHS  . lidocaine  1 patch Transdermal QHS  . pantoprazole  20 mg Oral Daily  . primidone  50 mg Oral QHS  . sodium chloride flush  3 mL Intravenous Q12H  . sodium chloride flush  3 mL Intravenous Q12H  . sodium chloride flush  3 mL Intravenous Q12H   Continuous Infusions: . sodium chloride 250 mL (10/02/19 0446)  . sodium chloride       LOS: 9 days   Georgette Shell, MD  10/05/2019, 1:37 PM

## 2019-10-06 LAB — BASIC METABOLIC PANEL
Anion gap: 11 (ref 5–15)
BUN: 53 mg/dL — ABNORMAL HIGH (ref 8–23)
CO2: 28 mmol/L (ref 22–32)
Calcium: 9.3 mg/dL (ref 8.9–10.3)
Chloride: 92 mmol/L — ABNORMAL LOW (ref 98–111)
Creatinine, Ser: 2.1 mg/dL — ABNORMAL HIGH (ref 0.44–1.00)
GFR calc Af Amer: 28 mL/min — ABNORMAL LOW (ref >60)
GFR calc non Af Amer: 24 mL/min — ABNORMAL LOW (ref >60)
Glucose, Bld: 105 mg/dL — ABNORMAL HIGH (ref 70–99)
Potassium: 4.5 mmol/L (ref 3.5–5.1)
Sodium: 131 mmol/L — ABNORMAL LOW (ref 135–145)

## 2019-10-06 LAB — GLUCOSE, CAPILLARY
Glucose-Capillary: 110 mg/dL — ABNORMAL HIGH (ref 70–99)
Glucose-Capillary: 174 mg/dL — ABNORMAL HIGH (ref 70–99)
Glucose-Capillary: 108 mg/dL — ABNORMAL HIGH (ref 70–99)
Glucose-Capillary: 182 mg/dL — ABNORMAL HIGH (ref 70–99)

## 2019-10-06 MED ORDER — ALLOPURINOL 100 MG PO TABS: 200.0000 mg | ORAL_TABLET | Freq: Every day | ORAL | 1 refills | Status: DC

## 2019-10-06 MED ORDER — HYDRALAZINE HCL 25 MG PO TABS
12.5000 mg | ORAL_TABLET | Freq: Three times a day (TID) | ORAL | 11 refills | Status: DC
Start: 2019-10-06 — End: 2019-12-02

## 2019-10-06 MED ORDER — FUROSEMIDE 80 MG PO TABS
80.0000 mg | ORAL_TABLET | Freq: Every day | ORAL | Status: DC
  Administered 2019-10-06 – 2019-10-07 (×2): 80 mg via ORAL
  Filled 2019-10-06 (×2): qty 1

## 2019-10-06 MED ORDER — INSULIN GLARGINE 100 UNIT/ML ~~LOC~~ SOLN: 5.0000 [IU] | Freq: Every day | SUBCUTANEOUS | 11 refills | Status: DC

## 2019-10-06 NOTE — Progress Notes (Addendum)
   Palliative Medicine Inpatient Follow Up Note   Reason for consult:  Goals of Care  HPI:  Per intake H&P --> Kara Reichardis a 64 y.o.femalewith medical history significant ofhypertension, hyperlipidemia, insulin-dependent diabetes mellitus, systolic congestive heart failure with ejection fraction of 40 to 45%, morbid obesity, CKD stage IIIb, chronic hypoxemic respiratory failure-on 4 L of oxygen via nasal cannulae at home presents to emergency department due to worsening shortness of breath, leg edema since 1 week.  Palliative care was consulted in the setting of advanced heart failure initially thought to possibly be related to amyloid though this not longer seems to be the case per cMRI.   Patient is followed by the paramedicine program as an outpatient.  Today's Discussion (10/06/2019): Chart reviewed. I met with [name] this afternoon. She shared with me the plan for discharge today. I asked her is she had given anymore though to our conversation from two days prior. She stated that she had not. I told her that I truly feel these are important discussions that should be had as I worry if she declines further her outlook may be poor. I again emphasized wanting to keep her quality of life in mind.  I called patients daughter, [name]. She stated that she and her brothers still plan on coming in this afternoon to discuss her present health state.   Discussed the importance of continued conversation with family and their  medical providers regarding overall plan of care and treatment options, ensuring decisions are within the context of the patients values and GOCs.  Provided "Hard Choices for [employer]" booklet.   Questions and concerns addressed   Addendum: Patient daughter, [name] called me around 1530 and stated that her brother will need to work late into the evening and will be unable to come in for our meeting. He will not be able to pick his mother up until tomorrow. We  discussed the importance of continued conversations regarding goals of care and potential hospice. Outpatient palliative care has been ordered to continue this conversations. I strongly recommended consideration at the very least of DNR code status.   The medical team was update on families inability to pick to patient up this evening   SUMMARY OF RECOMMENDATIONS   Full code for the time being  TOC --> OP Palliative care  Time Spent: 25 Greater than 50% of the time was spent in counseling and coordination of care ______________________________________________________________________________________ [place] Team Team Cell Phone: [phone] Please utilize secure chat with additional questions, if there is no response within 30 minutes please call the above phone number  Palliative Medicine Team providers are available by phone from 7am to 7pm daily and can be reached through the team cell phone.  Should this patient require assistance outside of these hours, please call the patient's attending physician.

## 2019-10-06 NOTE — Progress Notes (Addendum)
PT is recommending HHPT. Met with pt. She reports that she already receives [facility] PT and RN with [facility]. She reports that [employer] provides her O2 supplies. She reports that she has a hospital bed.

## 2019-10-06 NOTE — Progress Notes (Signed)
Advanced Heart Failure Rounding Note  PCP-Cardiologist: Candee Furbish, MD   Subjective:    Off milrinone. Says she feels ok. No CP or SOB. Creatinine improved  Neck sore on left.    Objective:   Weight Range: 64.8 kg Body mass index is 27.91 kg/m.   Vital Signs:   Temp:  [97.9 F (36.6 C)-99.1 F (37.3 C)] 97.9 F (36.6 C) (07/04 0454) Pulse Rate:  [88-93] 93 (07/04 0454) Resp:  [16-18] 16 (07/04 0454) BP: (130-162)/(55-76) 130/61 (07/04 0454) SpO2:  [97 %-100 %] 98 % (07/04 0454) Weight:  [64.8 kg] 64.8 kg (07/04 0457) Last BM Date: 10/05/19  Weight change: Filed Weights   10/04/19 0418 10/05/19 0418 10/06/19 0457  Weight: 64.5 kg 63.9 kg 64.8 kg    Intake/Output:   Intake/Output Summary (Last 24 hours) at 10/06/2019 1024 Last data filed at 10/06/2019 0600 Gross per 24 hour  Intake 200 ml  Output 1000 ml  Net -800 ml      Physical Exam    General:  Ling in bed  No resp difficulty HEENT: normal Neck: supple. JVP 8-9 Carotids 2+ bilat; no bruits. No lymphadenopathy or thryomegaly appreciated. Cor: PMI nondisplaced. Regular rate & rhythm. No rubs, gallops or murmurs. Lungs: clear Abdomen: soft, nontender, nondistended. No hepatosplenomegaly. No bruits or masses. Good bowel sounds. Extremities: no cyanosis, clubbing, rash, edema Neuro: alert & orientedx3, cranial nerves grossly intact. moves all 4 extremities w/o difficulty. Affect pleasant   Telemetry   NSR 80-90 Personally reviewed   EKG    No new EKG to review   Labs    CBC Recent Labs    10/04/19 0510 10/05/19 1459  WBC 5.5 5.4  HGB 9.1* 9.5*  HCT 30.1* 30.9*  MCV 89.9 89.8  PLT 286 762   Basic Metabolic Panel Recent Labs    10/05/19 0334 10/06/19 0355  NA 132* 131*  K 4.1 4.5  CL 93* 92*  CO2 28 28  GLUCOSE 92 105*  BUN 50* 53*  CREATININE 2.33* 2.10*  CALCIUM 9.4 9.3   Liver Function Tests No results for input(s): AST, ALT, ALKPHOS, BILITOT, PROT, ALBUMIN in the last 72  hours. No results for input(s): LIPASE, AMYLASE in the last 72 hours. Cardiac Enzymes No results for input(s): CKTOTAL, CKMB, CKMBINDEX, TROPONINI in the last 72 hours.  BNP: BNP (last 3 results) Recent Labs    05/18/19 0926 07/01/19 1244 09/26/19 1427  BNP 875.8* 1,267.1* 1,420.6*    ProBNP (last 3 results) No results for input(s): PROBNP in the last 8760 hours.   D-Dimer No results for input(s): DDIMER in the last 72 hours. Hemoglobin A1C No results for input(s): HGBA1C in the last 72 hours. Fasting Lipid Panel No results for input(s): CHOL, HDL, LDLCALC, TRIG, CHOLHDL, LDLDIRECT in the last 72 hours. Thyroid Function Tests No results for input(s): TSH, T4TOTAL, T3FREE, THYROIDAB in the last 72 hours.  Invalid input(s): FREET3  Other results:   Imaging    No results found.   Medications:     Scheduled Medications: . allopurinol  200 mg Oral QHS  . aspirin EC  81 mg Oral q AM  . atorvastatin  80 mg Oral QHS  . DULoxetine  30 mg Oral QHS  . feeding supplement (ENSURE ENLIVE)  237 mL Oral BID BM  . furosemide  80 mg Oral Daily  . gabapentin  100 mg Oral QHS  . heparin  5,000 Units Subcutaneous Q8H  . hydrALAZINE  10 mg Oral  Q8H  . insulin aspart  0-5 Units Subcutaneous QHS  . insulin aspart  0-9 Units Subcutaneous TID WC  . insulin glargine  5 Units Subcutaneous QHS  . lidocaine  1 patch Transdermal QHS  . pantoprazole  20 mg Oral Daily  . primidone  50 mg Oral QHS  . sodium chloride flush  3 mL Intravenous Q12H  . sodium chloride flush  3 mL Intravenous Q12H  . sodium chloride flush  3 mL Intravenous Q12H    Infusions: . sodium chloride 250 mL (10/02/19 0446)  . sodium chloride      PRN Medications: sodium chloride, sodium chloride, acetaminophen, loperamide, metoCLOPramide, nitroGLYCERIN, ondansetron (ZOFRAN) IV, sodium chloride flush, sodium chloride flush    Patient Profile   64 y/o female w/ h/o HTN, HLD, IDDM, Stage 3 CKD, chronic  systolic and diastolic heart failure, NICM and history of poor med compliance. She is followed by Paramedicine. Multiple admissions in the last year for a/c CHF  Assessment/Plan   1. Acute on Chronic Combined Systolic and Diastolic CHF - NICM - Echo in 2018 w/ EF 30-35% (LHC w/ normal cors and normal EF by LVG 50-55%) - Echo 11/2017 & 10/2018 LVEF 50-55% - Echo 01/2019 EF 40-45% - Echo 02/2019 EF 30-35%, G3DD. RV ok. R/LHC 11/20 w/ mild CAD and normal CO - Echo 09/2019 EF 20-25%, G3DD (restrictive), RV normal.  -RHC 09/30/2019 with elevated filling pressures and low output HF - Viral cardiomyopathy as she did have COVID PNA 07/2018 and EF had been normal prior to this - MRI 10/01/19 w/ LVEF 26%, RVEF 46%, no subendocardial delayed myocardial enhancement, ECV is 29%, and no evidence of cardiac amyloidosis. -PYP scan 10/02/2019: Equivocal for TTR amyloidosis doubt TTR given MRI negative for amyloid,   - h/o poor med compliance (followed by paramedicine) w/ multiple admissions for a/c CHF in the last 12 months - Myeloma Panel appears unremarkable. Evidence of  monoclonal protein is not apparent.  - Milrinone added post cath. Improved diuresis noted. - Off milrinone. Feels ok. Creatinine improved.  - Volume status ok. - no ARB/ ARNI/ Spiro/ Digoxin w/ CKD - continue hydralazine. Intolerant to Imdur  - needs to improve compliance w/ home meds. Continue paramedcine once discharged - not a good candidate for advanced therapies given h/o poor compliance - she has very advanced HF. At high risk for recurrent decompensation off milrinone - palliative care now following. I had long discussion with patient and her daughter on 7/2. If fails milrinone wean would switch to Hospice    2. Stage III CKD - Prior SCr in March 2021 was 1.77  - SCr on this admit elevated at 2.47. Was down to 1.83 after addition of milrinone but now back up to 2.3. Now stable at 2.1 - renal US negative  - nephrology following      3. IDDM  - on insulin and controlled - hgb A1c 7.1    4. Hypertension  - Blood pressure well controlled. Continue current regimen.   5. Obesity  - Body mass index is 29.72 kg/m.  - educated on healthy diet    6. Poor Med Compliance - continue w/ paramedicine once discharged   7. Chronic Hypoxic Respiratory Failure - On 4 liters prior to admit. Sats stable.   8. Anemia  -Hgb stable - iron 25 on 6/28, given Faraheme 6/29, iron 6/30 was 338  From HF standpoint I think she can go home today.  ASA 81 Atorva 80 Torsemide 40 bid (  was on lasix 80 bid at home) Hydralazine 12.5 tid (new)  Stop Carvedilol for now  F/u in HF clinic. If renal function stable would add Farxiga at first visit.   We will sign off today.    Length of Stay: Varina, MD  10/06/2019, 10:24 AM  Advanced Heart Failure Team Pager [phone] (M-F; 7a - 4p)  Please contact [facility] for night-coverage after hours (4p -7a ) and weekends on [email or web address]  10:24 AM

## 2019-10-06 NOTE — Discharge Summary (Addendum)
Physician Discharge Summary  Kara Hanson YBW:[id number] DOB: 16-May-1955 DOA: 09/26/2019  PCP: [name], MD  Admit date: 09/26/2019 Discharge date: 10/07/19 Admitted From: Home Disposition: Home Recommendations for Outpatient Follow-up:  1. Follow up with PCP in 1-2 weeks 2. Please obtain BMP/CBC in one week 3. Ambulatory palliative care referral  Home Health physical therapy Equipment/Devices: Oxygen  discharge Condition: Stable CODE STATUS full code Diet recommendation: Cardiac diet Brief/Interim Summary:Kara Reichardis a 64 y.o.femalewith medical history significant ofhypertension, hyperlipidemia, insulin-dependent diabetes mellitus, systolic congestive heart failure with ejection fraction of 40 to 45%, morbid obesity, CKD stage IIIb, chronic hypoxemic respiratory failure-on 4 L of oxygen via nasal cannula at home presented to emergency department due to worsening shortness of breath, leg edema since 1 week.  She also reported orthopnea, PND, weight gain of 6 pounds. Reportedmidsternal chest pain which is constant, 8 out of 10, nonradiating, no aggravating or relieving factors. Questionable compliance with Lasix.  She is followed by cardiology Dr. Lorel Monaco seen in April 2021.  ED Course:Upon arrival to ED: vital signs stable. Afebrile with no leukocytosis. CBC shows anemia of chronic disease. CMP showed worsening kidney function. BNP elevated at 1420,troponin: 125, EKG shows sinus rhythm, right axis deviation, diffuse T wave inversion similar to previous EKG. No ST elevation or depression noted. COVID-19 negative, sodium 131, chloride: 97, chest x-ray shows mild cardiomegaly and interstitial edema, trace right pleural effusion. Admitted for acute on chronic systolic CHF exacerbation.   Discharge Diagnoses:  Principal Problem:   Acute on chronic systolic CHF (congestive heart failure) (HCC) Active Problems:   Hypertension   Normochromic normocytic  anemia   Type 2 diabetes mellitus with diabetic neuropathy, unspecified (HCC)   Hyperlipidemia LDL goal <70   Acute renal failure superimposed on stage 3 chronic kidney disease (HCC)   Elevated troponin   Hyponatremia   CHF exacerbation (HCC)   Cardiorenal syndrome   Palliative care by specialist   Goals of care, counseling/discussion   #1 acute on chronic combined systolic and diastolic heart failure with advanced CHF- upon admission her BNP was elevated at 1420 with chest x-ray findings consistent with interstitial edema.  Echo with worsening ejection fraction 20 to 25%. Viral cardiomyopathy is considered as a differential diagnosis as she had Covid pneumonia in April 2020 and prior to this her ejection fraction was normal.  She was followed by nephrology and cardiology. Diuresis was managed by nephrology. She was treated with milrinone drip.  Milrinone was stopped 10/05/2019.  P YP scan to rule out a myeloid negative. Cardiac MRI 10/01/2019-left ventricular ejection fraction 26%, right ventricular ejection fraction 46%. Cardiology felt she was not a good candidate for advanced therapies due to poor compliance and she would be at high risk for recurrent decompensation off of milrinone.  Palliative care was consulted.  They discussed with patient and family members and wanted to continue full code as of now and full scope of care. I will refer her to outpatient ambulatory palliative care. Discharge cardiac medications include aspirin 81 mg daily Atorvastatin 80 mg daily Torsemide 40 mg twice daily Hydralazine 12.5 mg 3 times daily Carvedilol was stopped per cardiology recommendation. Continue physical therapy with home health at home. Cardiology to consider starting Farxiga on follow-up if renal function remains stable.  #2 AKI on CKD stage IIIb -patient will be discharged on torsemide 40 mg twice daily.  Creatinine on the day of discharge was 2.10.  #3 chronic hypoxic respiratory  failure secondary to systolic heart failure on 4  L of oxygen at home.  #4 type 2 diabetes-continue Lantus.   #5 anemia of chronic disease hemoglobin status post Feraheme 10/01/2019 hemoglobin 9.5 on discharge.  #6 gout on allopurinol  #7 diarrhea-resolved.  #8 goals of care patient remains full code for now.  Daughter would like to discuss with 2 of her brothers before she can make a better decision.  Recommended that patient should be DNR with her ongoing severe cardiomyopathy.   Nutrition Problem: Increased nutrient needs Etiology: chronic illness (CHF)    Signs/Symptoms: estimated needs     Interventions: Ensure Enlive (each supplement provides 350kcal and 20 grams of protein)  Estimated body mass index is 27.91 kg/m as calculated from the following:   Height as of this encounter: 5' (1.524 m).   Weight as of this encounter: 64.8 kg.  Discharge Instructions  Discharge Instructions    Avoid straining   Complete by: As directed    Call MD for:  difficulty breathing, headache or visual disturbances   Complete by: As directed    Call MD for:  persistant nausea and vomiting   Complete by: As directed    Call MD for:  redness, tenderness, or signs of infection (pain, swelling, redness, odor or green/yellow discharge around incision site)   Complete by: As directed    Call MD for:  severe uncontrolled pain   Complete by: As directed    Diet - low sodium heart healthy   Complete by: As directed    Face-to-face encounter (required for Medicare/Medicaid patients)   Complete by: As directed    I Kara Hanson certify that this patient is under my care and that I, or a nurse practitioner or physician's assistant working with me, had a face-to-face encounter that meets the physician face-to-face encounter requirements with this patient on 10/06/2019. The encounter with the patient was in whole, or in part for the following medical condition(s) which is the primary reason  for home health care (List medical condition): chf   The encounter with the patient was in whole, or in part, for the following medical condition, which is the primary reason for home health care: chf   I certify that, based on my findings, the following services are medically necessary home health services: Physical therapy   Reason for Medically Necessary Home Health Services: Therapy- Personnel officer, Public librarian   My clinical findings support the need for the above services: Unsafe ambulation due to balance issues   Further, I certify that my clinical findings support that this patient is homebound due to: Unable to leave home safely without assistance   Heart Failure patients record your daily weight using the same scale at the same time of day   Complete by: As directed    Home Health   Complete by: As directed    To provide the following care/treatments: PT   Increase activity slowly   Complete by: As directed    STOP any activity that causes chest pain, shortness of breath, dizziness, sweating, or exessive weakness   Complete by: As directed      Allergies as of 10/06/2019      Reactions   Garlic Shortness Of Breath, Itching, Swelling, Other (See Comments)   "Raw garlic" = Hand itching and swelling   Isosorbide Other (See Comments)   Sneezing and runny nose   Latex Itching   Morphine And Related Itching, Other (See Comments)   Headache   Other Itching, Other (See Comments)  Reaction to newspaper ink- Headaches, also      Medication List    STOP taking these medications   Basaglar KwikPen 100 UNIT/ML Replaced by: insulin glargine 100 UNIT/ML injection   carvedilol 25 MG tablet Commonly known as: COREG   furosemide 80 MG tablet Commonly known as: LASIX   metoCLOPramide 5 MG tablet Commonly known as: Reglan     TAKE these medications   Accu-Chek FastClix Lancets Misc USE AS DIRECTED TO TEST BLOOD SUGAR THREE TIMES DAILY   Accu-Chek Guide  test strip Generic drug: glucose blood USE AS DIRECTED TO TEST BLOOD SUGAR THREE TIMES DAILY   Accu-Chek Guide w/Device Kit 1 each by Does not apply route 3 (three) times daily.   acetaminophen 500 MG tablet Commonly known as: TYLENOL Take 500-1,000 mg by mouth every 6 (six) hours as needed for mild pain or headache.   allopurinol 100 MG tablet Commonly known as: ZYLOPRIM Take 2 tablets (200 mg total) by mouth at bedtime. What changed:   medication strength  how much to take   aspirin EC 81 MG tablet Take 81 mg by mouth in the morning.   atorvastatin 80 MG tablet Commonly known as: LIPITOR Take 80 mg by mouth at bedtime.   DULoxetine 30 MG capsule Commonly known as: CYMBALTA Take 1 capsule (30 mg total) by mouth daily. What changed: when to take this   Easy Comfort Pen Needles 31G X 5 MM Misc Generic drug: Insulin Pen Needle USE FOUR TIMES PER DAY FOR INSULIN ADMINISTRATION What changed: See the new instructions.   ergocalciferol 1.25 MG (50000 UT) capsule Commonly known as: VITAMIN D2 Take 50,000 Units by mouth every Sunday.   gabapentin 100 MG capsule Commonly known as: NEURONTIN Take 1 capsule (100 mg total) by mouth at bedtime. What changed: when to take this   insulin aspart 100 UNIT/ML injection Commonly known as: novoLOG 0 to 12 units subcutaneously 3 times daily before meals as per sliding scale What changed:   how much to take  how to take this  when to take this  additional instructions   insulin glargine 100 UNIT/ML injection Commonly known as: LANTUS Inject 0.05 mLs (5 Units total) into the skin at bedtime. Replaces: Basaglar KwikPen 100 UNIT/ML   Lancet Device Misc Use as instructed 3 times daily   lansoprazole 15 MG capsule Commonly known as: PREVACID TAKE 1 CAPSULE (15 MG TOTAL) BY MOUTH DAILY. What changed: See the new instructions.   Misc. Devices Misc Portable oxygen concentrator.  Diagnosis-chronic respiratory failure.    Misc. Devices Misc Rollaor with seat. Dx: Congestive Heart Failure   Misc. Devices Misc Scale; Dx - CHF   Misc. [facility] bed.  Diagnosis CHF.  Lifetime use.  Weight 165 lbs.   multivitamin with minerals tablet Take 1 tablet by mouth daily with breakfast.   OXYGEN Inhale 4 L/min into the lungs continuous.   polyethylene glycol 17 g packet Commonly known as: MIRALAX / GLYCOLAX Take 17 g by mouth daily as needed for moderate constipation. What changed: reasons to take this   primidone 50 MG tablet Commonly known as: MYSOLINE Take 1 tablet (50 mg total) by mouth in the morning and at bedtime.   sodium chloride 0.65 % Soln nasal spray Commonly known as: OCEAN Place 1 spray into both nostrils as needed for congestion.   Super B Complex/C Caps Take 1 capsule by mouth daily.       Allergies  Allergen Reactions  . Garlic  Shortness Of Breath, Itching, Swelling and Other (See Comments)    "Raw garlic" = Hand itching and swelling  . Isosorbide Other (See Comments)    Sneezing and runny nose  . Latex Itching  . Morphine And Related Itching and Other (See Comments)    Headache   . Other Itching and Other (See Comments)    Reaction to newspaper ink- Headaches, also    Consultations: Cardiology, nephrology, palliative care   Procedures/Studies: NM CARDIAC AMYLOID TUMOR LOC INFLAM SPECT 1 DAY  Result Date: 10/02/2019 PYP scan results for amyloidosis: Scan is consistent with: Grade 1 (increased heart uptake but less than rib uptake) Heart to contralateral lung ratio is: Between 1-1.5, indeterminate for amyloid Study is: Equivocal for TTR amyloidosis (visual score of 1/ratio between 1-1.5). However, tracer uptake appears to be in the blood pool, and this may overestimate the ratio, contributing to equivocal results.   CARDIAC CATHETERIZATION  Result Date: 09/30/2019 Findings: RA = 13 RV = 63/16 PA =  62/23 (40) PCW = 27 (v=43) Fick cardiac output/index = 4.8/2.9  Thermo CO/CI = 2.3/1.4 PVR = 5.7 WU (Thermo) Ao sat = 95% PA sat = 54%, 56% Assessment: 1. Markedly elevated biventricular filling pressures 2. Marked discrepancy between Fick and Thermo cardiac outputs; clinical picture most c/w Thermo numbers Plan/Discussion: Start IV milrinone. Continue high-dose IV lasix. Plan cMRI to further evaluate cause of worsening cardiomyopathy. Glori Bickers, MD 3:33 PM  US RENAL  Result Date: 09/26/2019 CLINICAL DATA:  Worsening renal function. EXAM: RENAL / URINARY TRACT ULTRASOUND COMPLETE COMPARISON:  Renal ultrasound dated February 16, 2019. FINDINGS: Right Kidney: Renal measurements: 10.4 x 4.2 x 4.3 cm = volume: 97 mL . Echogenicity within normal limits. No mass or hydronephrosis visualized. Left Kidney: Renal measurements: 11.2 x 5.0 x 4.2 cm = volume: 121 mL. Echogenicity within normal limits. No mass or hydronephrosis visualized. Bladder: Appears normal for degree of bladder distention. Other: None. IMPRESSION: Normal renal ultrasound. Electronically Signed   By: Titus Dubin M.D.   On: 09/26/2019 17:44   DG Chest Portable 1 View  Result Date: 09/26/2019 CLINICAL DATA:  Chest pain shortness of breath and leg EXAM: PORTABLE CHEST 1 VIEW COMPARISON:  July 01, 2019 FINDINGS: There is unchanged cardiomegaly. There is diffusely increased interstitial markings seen throughout lungs. Trace blunting of the right costophrenic angle likely trace effusion. Shallow degree aeration with subsegmental atelectasis. No acute osseous abnormality. IMPRESSION: Mild cardiomegaly and interstitial edema. Trace right pleural effusion. Electronically Signed   By: Prudencio Pair M.D.   On: 09/26/2019 14:16   MR CARDIAC MORPHOLOGY W WO CONTRAST  Result Date: 10/01/2019 CLINICAL DATA:  Myocardial viability evaluation, EF <30% COMPARISON: Echo 09/27/19 EXAM: CARDIAC MRI TECHNIQUE: The patient was scanned on a 1.5 Tesla GE magnet. A dedicated cardiac coil was used. Functional imaging was  done using Fiesta sequences. 2,3, and 4 chamber views were done to assess for RWMA's. Modified Simpson's rule using a short axis stack was used to calculate an ejection fraction on a dedicated work Conservation officer, nature. The patient received 45m GADAVIST GADOBUTROL 1 MMOL/ML IV SOLN. After 10 minutes inversion recovery sequences were used to assess for infiltration and scar tissue. FINDINGS: LEFT VENTRICLE: Mild-moderately dilated left ventricular chamber size. Mild concentric left ventricular hypertrophy. Maximal wall thickness 11 mm. Severely reduced left ventricular systolic function. LVEF = 26% Severe global hypokinesis. There is post contrast delayed myocardial enhancement in the inferior RV insertion point which can be seen with increased pulmonary  pressure. No subendocardial delayed myocardial enhancement, myocardium appears viable. Normal T1 myocardial nulling kinetics suggest against a diagnosis of cardiac amyloidosis. ECV = 29%, which may be consistent with hypertensive heart disease. RIGHT VENTRICLE: Normal right ventricular chamber size. Normal right ventricular wall thickness. Borderline reduced right ventricular systolic function. RVEF = 46%. There are no regional wall motion abnormalities. No post contrast delayed myocardial enhancement. ATRIA: Moderately dilated left atrial chamber size. Normal right atrial size. VALVES: No significant valvular abnormalities. Tricuspid aortic valve. Flow dephasing at the aortic valve phase contrast of the aortic valve performed. Maximum systolic velocity is 2.53 m/s. Quantitation may be subject to technical image acquisition factors, and may be underestimated in the setting of LV dysfunction. PERICARDIUM: Normal pericardium.  Small pericardial effusion. OTHER: Small-moderate bilateral pleural effusion. No other significant extracardiac findings. MEASUREMENTS: Left ventricle: LV Female LV EF: 26% (normal 56-78%) Absolute volumes: LV EDV: 182m (Normal 52-141  mL) LV ESV: 1232m(Normal 13-51 mL) LV SV: 4476mNormal 33-97 mL) CO: 3.1L/min (Normal 2.7-6.0 L/min) Indexed volumes: LV EDV: 100m83m-m (normal 41-81 mL/sq-m) LV ESV: 82mL32mm (Normal 12-21 mL/sq-m) LV SV: 26mL/63m (Normal 26-56 mL/sq-m) CI: 1.84L/min/sq-m (Normal 1.8-3.8 L/min/sq-m) Right ventricle: RV female RV EF: 46% (Normal 47-80%) Absolute volumes: RV EDV: 107 mL (Normal 58-154 mL) RV ESV: 58 mL (Normal 12-68 mL) RV SV: 49 mL (Normal 35-98 mL) CO: 3.4 L/min (Normal 2.7-6 L/min) Indexed volumes: RV EDV: 63 mL/sq-m (Normal 48-87 mL/sq-m) RV ESV: 34 mL/sq-m (Normal 11-28 mL/sq-m) RV SV: 29 mL/sq-m (Normal 27-57 mL/sq-m) CI: 2.0  L/min/sq-m (Normal 1.8-3.8 L/min/sq-m) IMPRESSION: 1. Severely reduced left ventricular systolic function. LVEF 26%. Mild-moderately enlarged LV chamber size. 2. Borderline reduced right ventricular systolic function. RVEF 46%. Normal RV chamber size. 3. No subendocardial delayed myocardial enhancement, myocardium appears viable. 4. There is post contrast delayed myocardial enhancement in the LV myocardium at the inferior RV insertion point which can be seen with increased pulmonary pressure. 5. Normal myocardial nulling kinetics, no evidence of cardiac amyloidosis. 6. ECV is 29%, which may be consistent with hypertensive heart disease. Electronically Signed   By: GayatrCherlynn Kaiser 10/01/2019 18:33   ECHOCARDIOGRAM LIMITED  Result Date: 09/27/2019    ECHOCARDIOGRAM LIMITED REPORT   Patient Name:   JUDITHIVAH GIRARDOTof Exam: 09/27/2019 Medical Rec #:  [id number] Height:       60.0 in Accession #:    [id number]:       154.5 lb Date of Birth:  9/23/1November 06, 1957 BSA:          1.673 m Patient Age:    63 yea61        BP:           120/58 mmHg Patient Gender: F               HR:           58 bpm. Exam Location:  Inpatient Procedure: Limited Echo, Limited Color Doppler and Cardiac Doppler Indications:    congestive heart failure 428.0  History:        Patient has  prior history of Echocardiogram examinations, most                 recent 04/19/2019. CHF; Risk Factors:Diabetes.  Sonographer:    LaurenJohny Chessring Phys: [id number] K[address]landeft ventricular ejection fraction, by estimation, is 20 to 25%. The  left ventricle has severely decreased function. The left ventricle demonstrates global hypokinesis. The left ventricular internal cavity size was mildly dilated. There is mild asymmetric left ventricular hypertrophy of the inferolateral segment. Left ventricular diastolic parameters are consistent with Grade III diastolic dysfunction (restrictive). Elevated left ventricular end-diastolic pressure.  2. Right ventricular systolic function is normal. The right ventricular size is normal. There is mildly elevated pulmonary artery systolic pressure. The estimated right ventricular systolic pressure is 00.8 mmHg.  3. Left atrial size was moderately dilated.  4. The mitral valve is grossly normal. Mild mitral valve regurgitation due to LV dysfunction with leaflet tenting.  5. Tricuspid valve regurgitation is moderate.  6. The aortic valve is abnormal. AV gradient may be underestimated due to LV dysfunction.  7. The inferior vena cava is normal in size with <50% respiratory variability, suggesting right atrial pressure of 8 mmHg. FINDINGS  Left Ventricle: Left ventricular ejection fraction, by estimation, is 20 to 25%. The left ventricle has severely decreased function. The left ventricle demonstrates global hypokinesis. The left ventricular internal cavity size was mildly dilated. There is mild asymmetric left ventricular hypertrophy of the inferolateral segment. Elevated left ventricular end-diastolic pressure. Right Ventricle: The right ventricular size is normal. No increase in right ventricular wall thickness. Right ventricular systolic function is normal. There is mildly elevated pulmonary artery systolic pressure. The tricuspid regurgitant velocity is  2.91  m/s, and with an assumed right atrial pressure of 8 mmHg, the estimated right ventricular systolic pressure is 67.6 mmHg. Left Atrium: Left atrial size was moderately dilated. Right Atrium: Right atrial size was normal in size. Mitral Valve: The mitral valve is grossly normal. Mild mitral annular calcification. Mild mitral valve regurgitation. Tricuspid Valve: The tricuspid valve is grossly normal. Tricuspid valve regurgitation is moderate. Aortic Valve: The aortic valve is abnormal. There is mild calcification of the aortic valve. Aortic valve mean gradient measures 4.0 mmHg. Pulmonic Valve: The pulmonic valve was normal in structure. Pulmonic valve regurgitation is trivial. Venous: The inferior vena cava is normal in size with less than 50% respiratory variability, suggesting right atrial pressure of 8 mmHg.  LEFT VENTRICLE PLAX 2D LVIDd:         5.60 cm LVIDs:         4.70 cm LV PW:         1.10 cm LV IVS:        0.90 cm LVOT diam:     1.90 cm LV SV:         37 LV SV Index:   22 LVOT Area:     2.84 cm  IVC IVC diam: 1.90 cm LEFT ATRIUM         Index LA diam:    4.00 cm 2.39 cm/m  AORTIC VALVE AV Mean Grad: 4.0 mmHg LVOT Vmax:    56.70 cm/s LVOT Vmean:   39.400 cm/s LVOT VTI:     0.129 m TRICUSPID VALVE TR Peak grad:   33.9 mmHg TR Vmax:        291.00 cm/s  SHUNTS Systemic VTI:  0.13 m Systemic Diam: 1.90 cm Cherlynn Kaiser MD Electronically signed by Cherlynn Kaiser MD Signature Date/Time: 09/27/2019/5:27:11 PM    Final     (Echo, Carotid, EGD, Colonoscopy, ERCP)    Subjective:  Very tired and weak resting in bed Discharge Exam: Vitals:   10/06/19 0454 10/06/19 1031  BP: 130/61 (!) 133/59  Pulse: 93 87  Resp: 16 18  Temp: 97.9 F (36.6 C) 98.1  F (36.7 C)  SpO2: 98% 100%   Vitals:   10/05/19 2007 10/06/19 0454 10/06/19 0457 10/06/19 1031  BP: (!) 155/68 130/61  (!) 133/59  Pulse: 92 93  87  Resp: _0 Temp: 99.1 F (37.3 C) 97.9 F (36.6 C)  98.1 F (36.7 C)  TempSrc:  Oral Oral  Oral  SpO2: 99% 98%  100%  Weight:   64.8 kg   Height:        General: Pt is alert, awake, not in acute distress Cardiovascular: RRR, S1/S2 +, no rubs, no gallops Respiratory: CTA bilaterally, no wheezing, no rhonchi Abdominal: Soft, NT, ND, bowel sounds + Extremities: no edema, no cyanosis    The results of significant diagnostics from this hospitalization (including imaging, microbiology, ancillary and laboratory) are listed below for reference.     Microbiology: Recent Results (from the past 240 hour(s))  SARS Coronavirus 2 by RT PCR (hospital order, performed in [facility] hospital lab) Nasopharyngeal Nasopharyngeal Swab     Status: None   Collection Time: 09/26/19  4:22 PM   Specimen: Nasopharyngeal Swab  Result Value Ref Range Status   SARS Coronavirus 2 NEGATIVE NEGATIVE Final    Comment: (NOTE) SARS-CoV-2 target nucleic acids are NOT DETECTED.  The SARS-CoV-2 RNA is generally detectable in upper and lower respiratory specimens during the acute phase of infection. The lowest concentration of SARS-CoV-2 viral copies this assay can detect is 250 copies / mL. A negative result does not preclude SARS-CoV-2 infection and should not be used as the sole basis for treatment or other patient management decisions.  A negative result may occur with improper specimen collection / handling, submission of specimen other than nasopharyngeal swab, presence of viral mutation(s) within the areas targeted by this assay, and inadequate number of viral copies (<250 copies / mL). A negative result must be combined with clinical observations, patient history, and epidemiological information.  Fact Sheet for Patients:   StrictlyIdeas.no  Fact Sheet for Healthcare Providers: BankingDealers.co.za  This test is not yet approved or  cleared by the [place] FDA and has been authorized for detection and/or diagnosis of SARS-CoV-2  by FDA under an Emergency Use Authorization (EUA).  This EUA will remain in effect (meaning this test can be used) for the duration of the COVID-19 declaration under Section 564(b)(1) of the Act, 21 U.S.C. section 360bbb-3(b)(1), unless the authorization is terminated or revoked sooner.  Performed at [facility], [place] [address], [place], [place] [address]      Labs: BNP (last 3 results) Recent Labs    05/18/19 0926 07/01/19 1244 09/26/19 1427  BNP 875.8* 1,267.1* 2,947.6*   Basic Metabolic Panel: Recent Labs  Lab 10/01/19 0507 10/01/19 0507 10/02/19 5465 10/03/19 0848 10/04/19 0510 10/05/19 0334 10/06/19 0355  NA 134*   < > 133* 132* 133* 132* 131*  K 3.8   < > 4.4 4.1 4.1 4.1 4.5  CL 97*   < > 96* 93* 92* 93* 92*  CO2 29   < > _1 GLUCOSE 77   < > 58* 97 103* 92 105*  BUN 55*   < > 51* 49* 50* 50* 53*  CREATININE 1.79*   < > 1.83* 2.08* 2.29* 2.33* 2.10*  CALCIUM 9.1   < > 9.4 9.4 9.5 9.4 9.3  MG 2.0  --   --   --   --   --   --    < > =  values in this interval not displayed.   Liver Function Tests: No results for input(s): AST, ALT, ALKPHOS, BILITOT, PROT, ALBUMIN in the last 168 hours. No results for input(s): LIPASE, AMYLASE in the last 168 hours. No results for input(s): AMMONIA in the last 168 hours. CBC: Recent Labs  Lab 09/30/19 0515 09/30/19 1514 09/30/19 1520 10/01/19 0507 10/02/19 0608 10/04/19 0510 10/05/19 1459  WBC 4.4  --   --  4.2 5.3 5.5 5.4  HGB 8.5*   < > 9.2* 8.3* 8.6* 9.1* 9.5*  HCT 28.2*   < > 27.0* 27.7* 28.4* 30.1* 30.9*  MCV 90.7  --   --  89.6 89.6 89.9 89.8  PLT 250  --   --  252 247 286 301   < > = values in this interval not displayed.   Cardiac Enzymes: No results for input(s): CKTOTAL, CKMB, CKMBINDEX, TROPONINI in the last 168 hours. BNP: Invalid input(s): POCBNP CBG: Recent Labs  Lab 10/05/19 1136 10/05/19 1619 10/05/19 2039 10/06/19 0553 10/06/19 1103  GLUCAP 185* 148* 182* 110* 174*    D-Dimer No results for input(s): DDIMER in the last 72 hours. Hgb A1c No results for input(s): HGBA1C in the last 72 hours. Lipid Profile No results for input(s): CHOL, HDL, LDLCALC, TRIG, CHOLHDL, LDLDIRECT in the last 72 hours. Thyroid function studies No results for input(s): TSH, T4TOTAL, T3FREE, THYROIDAB in the last 72 hours.  Invalid input(s): FREET3 Anemia work up No results for input(s): VITAMINB12, FOLATE, FERRITIN, TIBC, IRON, RETICCTPCT in the last 72 hours. Urinalysis    Component Value Date/Time   COLORURINE AMBER (A) 09/26/2019 1645   APPEARANCEUR CLOUDY (A) 09/26/2019 1645   LABSPEC 1.014 09/26/2019 1645   PHURINE 5.0 09/26/2019 1645   GLUCOSEU 50 (A) 09/26/2019 1645   HGBUR SMALL (A) 09/26/2019 1645   BILIRUBINUR NEGATIVE 09/26/2019 1645   BILIRUBINUR negative 12/13/2017 1514   KETONESUR NEGATIVE 09/26/2019 1645   PROTEINUR >=300 (A) 09/26/2019 1645   UROBILINOGEN 0.2 12/13/2017 1514   NITRITE NEGATIVE 09/26/2019 1645   LEUKOCYTESUR SMALL (A) 09/26/2019 1645   Sepsis Labs Invalid input(s): PROCALCITONIN,  WBC,  LACTICIDVEN Microbiology Recent Results (from the past 240 hour(s))  SARS Coronavirus 2 by RT PCR (hospital order, performed in [place] hospital lab) Nasopharyngeal Nasopharyngeal Swab     Status: None   Collection Time: 09/26/19  4:22 PM   Specimen: Nasopharyngeal Swab  Result Value Ref Range Status   SARS Coronavirus 2 NEGATIVE NEGATIVE Final    Comment: (NOTE) SARS-CoV-2 target nucleic acids are NOT DETECTED.  The SARS-CoV-2 RNA is generally detectable in upper and lower respiratory specimens during the acute phase of infection. The lowest concentration of SARS-CoV-2 viral copies this assay can detect is 250 copies / mL. A negative result does not preclude SARS-CoV-2 infection and should not be used as the sole basis for treatment or other patient management decisions.  A negative result may occur with improper specimen collection /  handling, submission of specimen other than nasopharyngeal swab, presence of viral mutation(s) within the areas targeted by this assay, and inadequate number of viral copies (<250 copies / mL). A negative result must be combined with clinical observations, patient history, and epidemiological information.  Fact Sheet for Patients:   StrictlyIdeas.no  Fact Sheet for Healthcare Providers: BankingDealers.co.za  This test is not yet approved or  cleared by the [place] FDA and has been authorized for detection and/or diagnosis of SARS-CoV-2 by FDA under an Emergency Use Authorization (EUA).  This EUA will remain in effect (meaning this test can be used) for the duration of the COVID-19 declaration under Section 564(b)(1) of the Act, 21 U.S.C. section 360bbb-3(b)(1), unless the authorization is terminated or revoked sooner.  Performed at [facility], [place] [address] [place], [place] [address]      Time coordinating discharge:  39 minutes  SIGNED:   Georgette Shell, MD  [facility] 10/06/2019, 12:53 PM Pager   If 7PM-7AM, please contact night-coverage [email or web address] Password [email or web address]

## 2019-10-06 NOTE — Progress Notes (Signed)
Received referral to assist pt with palliative care as outpt. Discussed referral with pt. She doesn't have a preference for a hospice agency. She agrees to use [facility]. Contacted Anderson Malta with [facility] for referral.

## 2019-10-06 NOTE — Progress Notes (Addendum)
Pt has been D/C. Received a message that pt's son can't pick her up today. Contacted pt's daughter [name]) at [phone]. [name] reports that she is out of town and her brother has to work overtime and he is not able to pick her up today. She reports that her brother is also supposed to pick her up. She reports that she doesn't want a taxi to transport her mother. Daughter reports that she lives with her mother and there is nobody to stay with her mom if she is D/C today. Her brother can pick her up tomorrow at 1:30-2:00 pm. Notified Dr. Rodena Piety and RN.

## 2019-10-06 NOTE — Progress Notes (Signed)
[facility] Documentation    Writer received referral for pt to participate in [facility] palliative program once pt discharges. Called pt's room to confirm interest with no answer. Liaison to follow while in hospital to alert [facility] palliative team who will then outreach pt to arrange Palliative admission visit.     Please do not hesitate to call with any questions and thank you for the referral.     Jennifer Love, RN  [facility] Hospital Liaison   [phone]   

## 2019-10-06 NOTE — Progress Notes (Signed)
[facility] Progress Note    Assessment/ Plan:   1.  Acute on chronic systolic CHF exacerbation: cardiology consulted.  Repeat TTE showing worsening EF, 20-25%.  Trops flat.  Coreg dose reduced in the setting of possible low- output state and was later discontinued per CHF and milrninone was started.  Myeloma panel- no M spike and immunofixation appears unremarkable.  There had been concern for possible amyloid or HTN cardiomyopathy. Per cMRI no evidence of cardiac amyloidosis.  Weight peak of 70.1 that I see and down to 63.9 kg as of rounds 7/3. Note that her home regimen is lasix 80 mg daily (though 80 mg BID charted) - lasix has been held - resume home regimen at lasix 80 mg daily   - advanced heart failure consulted - appreciate their assistance; off milrinone per CHF   - per CHF recs should she fail milrinone wean they recommend transition to hospice   - strict ins/outs  2.  AKI on CKD 3b: likely cardiorenal +/- hypotension.  Cr had been up to 2.5 from baseline of 1.9.  Renal US without obstruction.  - Diuretics held 7/2 and 7/3 and Cr appears to have plateaued  - Follow with supportive care  3.  Acute on chronic hypoxic RF: on O2, wean as able with vol removal.  Note she reports home requirement of 4 liters   4. normocytic anemia   - iron deficiency  - s/p feraheme 510 mg IV x 1 dose on 6/29 - aranesp 40 mcg once on 7/2 to optimize   5.  DM II: per primary team   6.  Dispo: continue inpatient monitoring.  Palliative is following.  Per CHF recs should she fail milrinone wean they recommend transition to hospice     Subjective:    She had 1 L UOP charted over 7/3.  Milrinone was stopped on 7/3.  States on lasix 80 mg daily (not BID) at home.  Her weight was 64.8 kg this AM (up from 63.9 kg on 7/3 - had been felt still slightly hypovolemic at that time)  Review of systems:   Denies shortness of breath - states about at her baseline Denies n/v Denies chest  pain    Objective:   BP 130/61 (BP Location: Right Arm)   Pulse 93   Temp 97.9 F (36.6 C) (Oral)   Resp 16   Ht 5' (1.524 m)   Wt 64.8 kg   SpO2 98%   BMI 27.91 kg/m   Intake/Output Summary (Last 24 hours) at 10/06/2019 6712 Last data filed at 10/06/2019 0600 Gross per 24 hour  Intake 200 ml  Output 1000 ml  Net -800 ml   Weight change: 0.953 kg  Physical Exam:  GEN adult female in bed in NAD    HEENT EOMI sclerae nonicteric PULM on 3.5 liters O2, unlabored at rest, bibasilar crackles appreciated CV S1S2 no rub ABD soft some abd distention  EXT no pitting LE edema NEURO AAO x 3 no asterixis Psych normal mood and affect   Imaging: No results found.  Labs: BMET Recent Labs  Lab 09/30/19 0515 09/30/19 1514 09/30/19 1520 10/01/19 0507 10/02/19 4580 10/03/19 0848 10/04/19 0510 10/05/19 0334 10/06/19 0355  NA 135   < > 136 134* 133* 132* 133* 132* 131*  K 3.6   < > 4.0 3.8 4.4 4.1 4.1 4.1 4.5  CL 98  --   --  97* 96* 93* 92* 93* 92*  CO2 27  --   --  29 28 29 30 28 28   GLUCOSE 138*  --   --  77 58* 97 103* 92 105*  BUN 59*  --   --  55* 51* 49* 50* 50* 53*  CREATININE 2.01*  --   --  1.79* 1.83* 2.08* 2.29* 2.33* 2.10*  CALCIUM 9.0  --   --  9.1 9.4 9.4 9.5 9.4 9.3   < > = values in this interval not displayed.   CBC Recent Labs  Lab 10/01/19 0507 10/02/19 0608 10/04/19 0510 10/05/19 1459  WBC 4.2 5.3 5.5 5.4  HGB 8.3* 8.6* 9.1* 9.5*  HCT 27.7* 28.4* 30.1* 30.9*  MCV 89.6 89.6 89.9 89.8  PLT 252 247 286 301    Medications:    . allopurinol  200 mg Oral QHS  . aspirin EC  81 mg Oral q AM  . atorvastatin  80 mg Oral QHS  . DULoxetine  30 mg Oral QHS  . feeding supplement (ENSURE ENLIVE)  237 mL Oral BID BM  . gabapentin  100 mg Oral QHS  . heparin  5,000 Units Subcutaneous Q8H  . hydrALAZINE  10 mg Oral Q8H  . insulin aspart  0-5 Units Subcutaneous QHS  . insulin aspart  0-9 Units Subcutaneous TID WC  . insulin glargine  5 Units Subcutaneous  QHS  . lidocaine  1 patch Transdermal QHS  . pantoprazole  20 mg Oral Daily  . primidone  50 mg Oral QHS  . sodium chloride flush  3 mL Intravenous Q12H  . sodium chloride flush  3 mL Intravenous Q12H  . sodium chloride flush  3 mL Intravenous Q12H   Claudia Desanctis MD 10/06/2019, 8:54 AM

## 2019-10-07 ENCOUNTER — Telehealth (HOSPITAL_COMMUNITY): Payer: Self-pay

## 2019-10-07 LAB — BASIC METABOLIC PANEL
Anion gap: 9 (ref 5–15)
BUN: 53 mg/dL — ABNORMAL HIGH (ref 8–23)
CO2: 28 mmol/L (ref 22–32)
Calcium: 9.3 mg/dL (ref 8.9–10.3)
Chloride: 96 mmol/L — ABNORMAL LOW (ref 98–111)
Creatinine, Ser: 2.25 mg/dL — ABNORMAL HIGH (ref 0.44–1.00)
GFR calc Af Amer: 26 mL/min — ABNORMAL LOW (ref >60)
GFR calc non Af Amer: 22 mL/min — ABNORMAL LOW (ref >60)
Glucose, Bld: 212 mg/dL — ABNORMAL HIGH (ref 70–99)
Potassium: 4.5 mmol/L (ref 3.5–5.1)
Sodium: 133 mmol/L — ABNORMAL LOW (ref 135–145)

## 2019-10-07 LAB — GLUCOSE, CAPILLARY
Glucose-Capillary: 111 mg/dL — ABNORMAL HIGH (ref 70–99)
Glucose-Capillary: 199 mg/dL — ABNORMAL HIGH (ref 70–99)

## 2019-10-07 NOTE — Care Management Important Message (Signed)
Important Message  Patient Details  Name: Kara Hanson MRN: [id number] Date of Birth: 1955-04-30   Medicare Important Message Given:  Yes     [name] 10/07/2019, 12:42 PM

## 2019-10-07 NOTE — Plan of Care (Signed)
  Problem: Clinical Measurements: Goal: Will remain free from infection Outcome: Completed/Met   Problem: Pain Managment: Goal: General experience of comfort will improve Outcome: Completed/Met

## 2019-10-07 NOTE — Telephone Encounter (Signed)
Spoke to [place] who agreed to notify me once the patient was settled at home and I will go out to see her to assist with managing new medication regimen and follow up post discharge. Call complete.

## 2019-10-07 NOTE — TOC Progression Note (Signed)
Transition of Care [facility]) - Progression Note    Patient Details  Name: Jeanice Dempsey MRN: [id number] Date of Birth: Apr 28, 1955  Transition [facility] CM/SW Contact  Zenon Mayo, RN Phone Number: 10/07/2019, 3:05 PM  Clinical Narrative:    Daughter states her portable oxygen tank is not working at home, she has oxygen with [employer],  NCM contacted on call line , they will bring oxygen tank to patient room for her to go home with and then they will bring another portable out to her home tomorrow. Daughter states yes this will be fine.  Shanon Brow the rep will be bringing the tank after he fixes a concentrator on another call may take up to 2 to 3 hrs.  This was explained to patient and daughter.    Expected Discharge Plan: [place] Barriers to Discharge: Continued Medical Work up  Expected Discharge Plan and Services Expected Discharge Plan: [place]   Discharge Planning Services: CM Consult   Living arrangements for the past 2 months: Single Family Home Expected Discharge Date: 10/07/19                 DME Agency: NA       HH Arranged: NA           Social Determinants of Health (SDOH) Interventions    Readmission Risk Interventions Readmission Risk Prevention Plan 10/03/2019 05/22/2019 02/11/2019  Transportation Screening Complete Complete Complete  PCP or Specialist Appt within 3-5 Days - - -  Not Complete comments - - -  HRI or [place] for [place] Planning/Counseling - - -  [place] - - -  Medication Review Press photographer) Complete Complete Complete  PCP or Specialist appointment within 3-5 days of discharge - Complete Complete  HRI or [place] Complete Complete Complete  SW Recovery Care/Counseling Consult Complete Complete Complete  Palliative Care Screening Not Applicable Not Applicable Not [place] Not Applicable Not  Applicable Not Applicable  Some recent data might be hidden

## 2019-10-07 NOTE — Progress Notes (Signed)
Pt being discharged today with palliative care services.  For attempted wean off milrinone.  If she fails, Dr. Haroldine Laws recommended hospice services.  Nothing further to add at this time and will sign off.  Follow up with Dr. Royce Macadamia as an outpatient per Palliative Care and Heart Failure if needed.

## 2019-10-07 NOTE — TOC Progression Note (Signed)
Transition of Care [facility]) - Progression Note    Patient Details  Name: Airanna Partin MRN: [id number] Date of Birth: 08/15/1955  [facility]) CM/SW Contact  Zenon Mayo, RN Phone Number: 10/07/2019, 9:34 AM  Clinical Narrative:    NCM contacted [facility] to confirm that patient has PT and RN with them, she is in their home base program which she is in the Heart Failure program as well.  NCM asked if they needed orders , rep said no they have everything they need.    Expected Discharge Plan: [place] Barriers to Discharge: Continued Medical Work up  Expected Discharge Plan and Services Expected Discharge Plan: [place]   Discharge Planning Services: CM Consult   Living arrangements for the past 2 months: Single Family Home Expected Discharge Date: 10/07/19                 DME Agency: NA       HH Arranged: NA           Social Determinants of Health (SDOH) Interventions    Readmission Risk Interventions Readmission Risk Prevention Plan 10/03/2019 05/22/2019 02/11/2019  Transportation Screening Complete Complete Complete  PCP or Specialist Appt within 3-5 Days - - -  Not Complete comments - - -  HRI or [place] for [place] Planning/Counseling - - -  [place] - - -  Medication Review Press photographer) Complete Complete Complete  PCP or Specialist appointment within 3-5 days of discharge - Complete Complete  HRI or [place] Complete Complete Complete  SW Recovery Care/Counseling Consult Complete Complete Complete  Palliative Care Screening Not Applicable Not Applicable Not [place] Not Applicable Not Applicable Not Applicable  Some recent data might be hidden

## 2019-10-08 ENCOUNTER — Other Ambulatory Visit (HOSPITAL_COMMUNITY): Payer: Self-pay

## 2019-10-08 ENCOUNTER — Other Ambulatory Visit (HOSPITAL_COMMUNITY): Payer: Self-pay | Admitting: Cardiology

## 2019-10-08 ENCOUNTER — Telehealth (HOSPITAL_COMMUNITY): Payer: Self-pay | Admitting: Licensed Clinical Social Worker

## 2019-10-08 MED ORDER — TORSEMIDE 20 MG PO TABS
40.0000 mg | ORAL_TABLET | Freq: Two times a day (BID) | ORAL | 3 refills | Status: DC
Start: 2019-10-08 — End: 2020-02-20

## 2019-10-08 NOTE — Telephone Encounter (Signed)
[employer] Par[employer]ic reached out to CSW to inquire about getting pt a handicap placard.  CSW completed paperwork and placed for MD to review and sign if in agreement  CSW will continue to follow and assist as needed  Jorge Ny, [facility] Desk#: [phone] Cell#: [phone]

## 2019-10-08 NOTE — Telephone Encounter (Signed)
CSW received message from [facility] paramedic that Kiefer was able to complete disability placard request so no longer needed through [facility]  No further needs at this time- CSW will continue to follow and assist as needed  Jorge Ny, [place] Desk#: [phone] Cell#: [phone]

## 2019-10-08 NOTE — Progress Notes (Signed)
Paramedicine Encounter    Patient ID: Kara Hanson, female    DOB: 1955-04-06, 64 y.o.   MRN: [id number]   Patient Care Team: [name], MD as PCP - General (Family Medicine) Jerline Pain, MD as PCP - Cardiology (Cardiology)  Patient Active Problem List   Diagnosis Date Noted  . Palliative care by specialist   . Goals of care, counseling/discussion   . Cardiorenal syndrome   . Hyponatremia 09/26/2019  . CHF exacerbation ([place]) 09/26/2019  . Acute on chronic systolic CHF (congestive heart failure) ([place]) 05/19/2019  . Acute exacerbation of CHF (congestive heart failure) ([place]) 04/18/2019  . Elevated troponin 04/18/2019  . Acute respiratory failure with hypoxia ([place]) 02/09/2019  . NSTEMI (non-ST elevated myocardial infarction) ([place]) 02/09/2019  . S/P thoracentesis   . Atelectasis   . Chronic respiratory failure ([place])   . Acute renal failure superimposed on stage 3 chronic kidney disease ([place])   . Epigastric pain   . CHF (congestive heart failure) ([place]) 01/23/2019  . CAP (community acquired pneumonia) 10/31/2018  . Acute CHF (congestive heart failure) ([place]) 09/24/2018  . SIRS (systemic inflammatory response syndrome) ([place]) 07/25/2018  . COVID-19 virus infection 07/20/2018  . Chronic diastolic CHF (congestive heart failure) ([place]) 07/20/2018  . Pneumonia due to COVID-19 virus 07/20/2018  . Vitamin D deficiency 04/02/2018  . Spinal stenosis 03/29/2018  . Acute on chronic respiratory failure with hypoxia ([place]) 03/20/2018  . Controlled type 2 diabetes with neuropathy ([place]) 03/20/2018  . CKD (chronic kidney disease), stage III 03/20/2018  . Acute on chronic combined systolic and diastolic CHF (congestive heart failure) ([place]) 11/27/2017  . AKI (acute kidney injury) ([place]) 11/27/2017  . Acute respiratory distress 11/27/2017  . Bulging lumbar disc 11/14/2017  . Gout 11/14/2017  . Hyperlipidemia LDL goal <70 09/30/2016  . Chest pain 06/17/2016  . Stress-induced cardiomyopathy  05/19/2016  . Type 2 diabetes mellitus with diabetic neuropathy, unspecified ([place]) 05/06/2016  . Vertigo 05/06/2016  . Controlled type 2 diabetes mellitus with hyperglycemia ([place]) 04/23/2016  . Hypertension 04/23/2016  . Normochromic normocytic anemia 04/23/2016  . Syncope 04/23/2016    Current Outpatient Medications:  .  Accu-Chek FastClix Lancets MISC, USE AS DIRECTED TO TEST BLOOD SUGAR THREE TIMES DAILY, Disp: 102 each, Rfl: 12 .  acetaminophen (TYLENOL) 500 MG tablet, Take 500-1,000 mg by mouth every 6 (six) hours as needed for mild pain or headache., Disp: , Rfl:  .  allopurinol (ZYLOPRIM) 100 MG tablet, Take 2 tablets (200 mg total) by mouth at bedtime., Disp: 30 tablet, Rfl: 1 .  aspirin EC 81 MG tablet, Take 81 mg by mouth in the morning. , Disp: , Rfl:  .  atorvastatin (LIPITOR) 80 MG tablet, Take 80 mg by mouth at bedtime. , Disp: , Rfl:  .  Blood Glucose Monitoring Suppl (ACCU-CHEK GUIDE) w/Device KIT, 1 each by Does not apply route 3 (three) times daily., Disp: 1 kit, Rfl: 0 .  DULoxetine (CYMBALTA) 30 MG capsule, Take 1 capsule (30 mg total) by mouth daily. (Patient taking differently: Take 30 mg by mouth at bedtime. ), Disp: 30 capsule, Rfl: 3 .  EASY COMFORT PEN NEEDLES 31G X 5 MM MISC, USE FOUR TIMES PER DAY FOR INSULIN ADMINISTRATION (Patient taking differently: 4 (four) times daily. ), Disp: 200 each, Rfl: 12 .  ergocalciferol (VITAMIN D2) 1.25 MG (50000 UT) capsule, Take 50,000 Units by mouth every Sunday. , Disp: , Rfl:  .  gabapentin (NEURONTIN) 100 MG capsule, Take 1 capsule (100 mg  total) by mouth at bedtime. (Patient taking differently: Take 100 mg by mouth in the morning and at bedtime. ), Disp:  , Rfl:  .  glucose blood (ACCU-CHEK GUIDE) test strip, USE AS DIRECTED TO TEST BLOOD SUGAR THREE TIMES DAILY, Disp: 100 each, Rfl: 12 .  hydrALAZINE (APRESOLINE) 25 MG tablet, Take 0.5 tablets (12.5 mg total) by mouth 3 (three) times daily., Disp: 120 tablet, Rfl: 11 .   insulin aspart (NOVOLOG) 100 UNIT/ML injection, 0 to 12 units subcutaneously 3 times daily before meals as per sliding scale (Patient taking differently: Inject 0-12 Units into the skin See admin instructions. Inject 0-12 units into the skin three times a day before meals, per sliding scale), Disp: 30 mL, Rfl: 3 .  insulin glargine (LANTUS) 100 UNIT/ML injection, Inject 0.05 mLs (5 Units total) into the skin at bedtime., Disp: 10 mL, Rfl: 11 .  Lancet Device MISC, Use as instructed 3 times daily, Disp: 1 each, Rfl: 0 .  lansoprazole (PREVACID) 15 MG capsule, TAKE 1 CAPSULE (15 MG TOTAL) BY MOUTH DAILY. (Patient taking differently: Take 15 mg by mouth daily before breakfast. ), Disp: 30 capsule, Rfl: 2 .  Misc. Devices MISC, Portable oxygen concentrator.  Diagnosis-chronic respiratory failure., Disp: 1 each, Rfl: 0 .  Misc. Devices MISC, [place] with seat. Dx: Congestive Heart Failure, Disp: 1 each, Rfl: 0 .  Misc. Devices MISC, Scale; Dx - CHF, Disp: 1 each, Rfl: 0 .  Misc. Devices [facility] bed.  Diagnosis CHF.  Lifetime use.  Weight 165 lbs., Disp: 1 each, Rfl: 0 .  Multiple Vitamins-Minerals (MULTIVITAMIN WITH MINERALS) tablet, Take 1 tablet by mouth daily with breakfast. , Disp: , Rfl:  .  OXYGEN, Inhale 4 L/min into the lungs continuous., Disp: , Rfl:  .  polyethylene glycol (MIRALAX / GLYCOLAX) 17 g packet, Take 17 g by mouth daily as needed for moderate constipation. (Patient taking differently: Take 17 g by mouth daily as needed for mild constipation or moderate constipation (MIX AND DRINK). ), Disp: , Rfl:  .  primidone (MYSOLINE) 50 MG tablet, Take 1 tablet (50 mg total) by mouth in the morning and at bedtime., Disp: 180 tablet, Rfl: 1 .  sodium chloride (OCEAN) 0.65 % SOLN nasal spray, Place 1 spray into both nostrils as needed for congestion. , Disp: , Rfl:  .  SUPER B COMPLEX/C CAPS, Take 1 capsule by mouth daily. , Disp: , Rfl:  .  torsemide (DEMADEX) 20 MG tablet, Take 2 tablets (40  mg total) by mouth 2 (two) times daily., Disp: 120 tablet, Rfl: 3 Allergies  Allergen Reactions  . Garlic Shortness Of Breath, Itching, Swelling and Other (See Comments)    "Raw garlic" = Hand itching and swelling  . Isosorbide Other (See Comments)    Sneezing and runny nose  . Latex Itching  . Morphine And Related Itching and Other (See Comments)    Headache   . Other Itching and Other (See Comments)    Reaction to newspaper ink- Headaches, also     Social History   Socioeconomic History  . Marital status: Widowed    Spouse name: Not on file  . Number of children: Not on file  . Years of education: Not on file  . Highest education level: Not on file  Occupational History  . Not on file  Tobacco Use  . Smoking status: Never Smoker  . Smokeless tobacco: Never Used  Vaping Use  . Vaping Use: Never used  Substance  and Sexual Activity  . Alcohol use: No  . Drug use: No  . Sexual activity: Not Currently    Birth control/protection: None  Other Topics Concern  . Not on file  Social History Narrative  . Not on file   Social Determinants of Health   Financial Resource Strain:   . Difficulty of Paying Living Expenses:   Food Insecurity:   . Worried About Charity fundraiser in the Last Year:   . Arboriculturist in the Last Year:   Transportation Needs:   . Film/video editor (Medical):   Marland Kitchen Lack of Transportation (Non-Medical):   Physical Activity:   . Days of Exercise per Week:   . Minutes of Exercise per Session:   Stress:   . Feeling of Stress :   Social Connections:   . Frequency of Communication with Friends and Family:   . Frequency of Social Gatherings with Friends and Family:   . Attends Religious Services:   . Active Member of Clubs or Organizations:   . Attends Archivist Meetings:   Marland Kitchen Marital Status:   Intimate Partner Violence:   . Fear of Current or Ex-Partner:   . Emotionally Abused:   Marland Kitchen Physically Abused:   . Sexually Abused:      Physical Exam Vitals reviewed.  Constitutional:      Appearance: She is normal weight.  HENT:     Head: Normocephalic.     Nose: Nose normal.     Mouth/Throat:     Mouth: Mucous membranes are moist.     Pharynx: Oropharynx is clear.  Eyes:     Pupils: Pupils are equal, round, and reactive to light.  Cardiovascular:     Rate and Rhythm: Normal rate and regular rhythm.     Pulses: Normal pulses.     Heart sounds: Normal heart sounds.  Pulmonary:     Effort: Pulmonary effort is normal.  Abdominal:     General: Abdomen is flat.     Palpations: Abdomen is soft.  Musculoskeletal:        General: Normal range of motion.     Cervical back: Normal range of motion.     Right lower leg: No edema.     Left lower leg: No edema.  Skin:    General: Skin is warm and dry.     Capillary Refill: Capillary refill takes less than 2 seconds.  Neurological:     Mental Status: She is alert. Mental status is at baseline.     Motor: Weakness present.  Psychiatric:        Mood and Affect: Mood normal.    Arrived for home visit for Kara Hanson. Kara Hanson was laying in bed sleeping but alert upon conversation. Kara Hanson reports she is very weak but feels better than before her hospital admission. Kara Hanson was able to stand and walk to the bathroom on her own. Kara Hanson obtained a new oxygen concentrator and tubing upon arrival back home from hospital. Kara Hanson's vitals were obtained and are as noted. Kara Hanson's medications were reviewed and confirmed. 2 pill boxes were filled due to my absence next week. Kara Hanson, (judiths daughter) and I discussed care for Kara Hanson. I provided HF Clinic and Katie (community paramedic) number to [facility] in case they need anything next week while I am gone. Kara Hanson has a home visit with Pallative Care RN tomorrow at 2:00. I will assist in getting patient scheduled for HF Clinic follow up. Kara Hanson given paper for upcoming appointments  and current medication list. Kara Hanson reports Kara Hanson will be  going to beach with family next Friday-Sunday. I informed Kara Hanson the importance of Kara Hanson taking her medications properly and having her oxygen. Dietary information sent to Kara Hanson for her mother via text as well.  She understood. Home visit complete. I will see patient in two weeks.   Refills: Gabapentin    Pallative Care RN: Wray Kearns #[phone]     Future Appointments  Date Time Provider [place]  10/10/2019  9:30 AM [name], MD [facility] None  10/22/2019  2:45 PM Burtis Junes, NP [facility]  02/03/2020  8:15 AM Tat, Eustace Quail, DO [facility] None     ACTION: Home visit completed Next visit planned for Two Weeks

## 2019-10-09 ENCOUNTER — Telehealth: Payer: Self-pay

## 2019-10-09 ENCOUNTER — Other Ambulatory Visit: Payer: Medicare Other

## 2019-10-09 ENCOUNTER — Telehealth: Payer: Self-pay | Admitting: Family Medicine

## 2019-10-09 ENCOUNTER — Other Ambulatory Visit: Payer: Self-pay

## 2019-10-09 DIAGNOSIS — Z515 Encounter for palliative care: Secondary | ICD-10-CM

## 2019-10-09 NOTE — Telephone Encounter (Signed)
[place] RN Called in to request a order for a shower chair for pt. She would like to have it  sent to a DME company for pt.   Please call back with update.      [place] -[phone]

## 2019-10-09 NOTE — Telephone Encounter (Signed)
From the discharge call. This CM spoke with her daughter, [name].  Patient has appt with Dr Margarita Rana tomorrow - 10/10/2019   She said that her mother is all right. [name] took this week off from work to be with her. She is concerned that her mother lays in bed and will only get up to go to the bathroom. She has not been walking around and doesn't want to do anything  [name] has to remind her mother takes her medications.   She said this morning her mother was complaining of pain all over. She took 2 tylenol and his now fine.     [name] said that her mother has all of her medications and Heather, EMT reviewed the med list with her and filled med boxes for 2 weeks.  No questions at this time  .   Has been seen by [facility].   Receives weekly paramedicine visits Has hospital bed, rollator.  Has scale but [name] said that her mother will not weigh herself.   Has glucometer  Using O2 @ 4L.  From [employer], has POC.  Needs to contact [employer] about obtaining a new battery for the POC.  As per [name], EMT , [facility] will not be seeing the patient and will not be providing home health PT.  Home health will need to be ordered by PCP.  ADLs: daughter provides needed assistance. This CM explained about PCS but [name] said that her mother would not accept the services.

## 2019-10-09 NOTE — Progress Notes (Signed)
COMMUNITY PALLIATIVE CARE SW NOTE  PATIENT NAME: Kara Hanson DOB: 01/13/1956 MRN: [id number]  PRIMARY CARE PROVIDER: [name], MD  RESPONSIBLE PARTY:  Acct ID - Guarantor Home Phone Work Phone Relationship Acct Type  [id number] - [place],[phone]  Self P/F     [place], [place],  [address]     PLAN OF CARE and INTERVENTIONS:             1. GOALS OF CARE/ ADVANCE CARE PLANNING:  Patient is a DNR. MOST form completed. Patients goal is to remain home and gain strength in legs. 2. SOCIAL/EMOTIONAL/SPIRITUAL ASSESSMENT/ INTERVENTIONS:  SW and RN met with patient in her home for scheduled initial visit. Patient was lying in hospital bed with 4L of O2. Patient daughter [name] resides with her and provides assistance with care. 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 Patient shared that she is not in pain. Patient shared that she eats fine. Patient has support from [employer]. RN checked vitals and reviewed meds and educated on the importance of weighing herself daily. Patient shared that she gets short of breath when ambulating and showering. SW outreached PCP Dr. Margarita Rana office for order of shower chair for patient. SW provided education and support a bout palliative care services. Will continue to follow.  3. PATIENT/CAREGIVER EDUCATION/ COPING:  Patient alert and oriented and engaged in visit. Patient pleasant during visit and did not express any signs of stress or anxiety. Patient has 3 children (1 daughter and 2 sons) all lie locally and are supportive.  4. PERSONAL EMERGENCY PLAN:  Family to call 9-1-1 for emergencies. 5. COMMUNITY RESOURCES COORDINATION/ HEALTH CARE NAVIGATION:  Patient has Chemical engineer services. 6. FINANCIAL/LEGAL CONCERNS/INTERVENTIONS:  Patient has Medicaid.      SOCIAL HX:  Social History   Tobacco Use  . Smoking status: Never Smoker  . Smokeless tobacco: Never Used   Substance Use Topics  . Alcohol use: No    CODE STATUS: DNR ADVANCED DIRECTIVES: N MOST FORM COMPLETE:  Y HOSPICE EDUCATION PROVIDED: Y  PPS: Patient is able to ambulate without an assistive device but gets short of breath. Patient is able to toilet and bath independently.    Time Spent: Visit lasted 40 min.       Doreene Eland, [place]

## 2019-10-09 NOTE — Progress Notes (Unsigned)
PATIENT NAME: Kara Hanson DOB: [birth year] MRN: [id number]  PRIMARY CARE PROVIDER: [name], MD  RESPONSIBLE PARTY:  Acct ID - Guarantor Home Phone Work Phone Relationship Acct Type  [id number] - [place],[phone]  Self P/F     [place], [place], [place] [address]    PLAN OF CARE and INTERVENTIONS:               1.  GOALS OF CARE/ ADVANCE CARE PLANNING:  Patient wants to feel better and get stronger.                2.  PATIENT/CAREGIVER EDUCATION:  Education on palliative care services, education on need to weigh self daily, education on need to take meds as directed, reviewed meds, support                 3.  DISEASE STATUS:  SW [place] and RN made scheduled palliative care home visit. Palliative care team met with patient who was lying in hospital bed in her room. Patient is able to get up and ambulate short distances. Patient report her legs give out and she has to hold onto furniture when she walks. Patient receiving O2 via nasal cannula at 4 liters per minute. Patient's past medical history includes but not limited to CHF systolic and diastolic, hypertension, syncope, stress-induced cardiomyopathy, acute CHF, NSTEMI, exacerbation of congestive heart failure, cardiorenal syndrome, acute on chronic respiratory failure with hypoxia, pneumonia do to covid-19 virus, chronic respiratory failure, atelectasis, type 2 diabetes, bulging lumbar disc, acute kidney injury, CKD stage 3, history of chest pain, hyperlipidemia, GOUT, spinal stenosis, acute respiratory distress, vitamin D deficiency, covid-19 virus Infection, SIRS, epigastric pain, elevated troponin and hyponatremia. Patient has been hospitalized 4 times this year with the roast most recent being from [phone] due to CHF and acute respiratory failure. Patient lives in her own home and daughter and grandchildren in home with patient today. Daughter watching children so does not participate in visit. Patient  alert and oriented but is forgetful. Patient has paramedic [name] visit weekly to manage meds, weigh patient and make sure patient is aware of her appointments. Heather's phone number [phone]. Patients vital signs are stable. Patient reports having a non-productive cough. Patients breath sounds are diminished throughout. Patients weight today 124 lbs.  Patient has trace edema in right lower extremity.  Nurse provide an education to patient on importance of weighing self daily and taking medications as directed. Education to a patient to notify nurse or MD if her weight gain was more than 3 - 5 lbs. Patient in agreement with palliative care services.  RN reviewed patients medications with patient.  SW placed call to MD's office to request RX for shower chair for patient.  Patient has MD appointment with Dr Margarita Rana tomorrow.  Patient and daughter encouraged to contact palliative care with questions or concerns.        HISTORY OF PRESENT ILLNESS:  Patient is a 64 year old female who resides in home with her daughter Kara Hanson.  Patient in agreement with palliative care services.  Patient will be seen monthly and PRN.     CODE STATUS: Full Code  ADVANCED DIRECTIVES: N MOST FORM: Y PPS: 40%   PHYSICAL EXAM:   VITALS: Today's Vitals   10/09/19 1457  Weight: 140 lb (63.5 kg)  PainSc: 0-No pain    LUNGS: decreased breath sounds CARDIAC: Cor RRR  EXTREMITIES: trace edema right lower extremity  SKIN: Skin color,  texture, turgor normal. No rashes or lesions  NEURO: positive for gait problems, memory problems and weakness       Nilda Simmer, RN

## 2019-10-09 NOTE — Telephone Encounter (Signed)
Transition Care Management Follow-up Telephone Call  Call completed with patient's daughter, [name].  She said that her mother was resting.   Date of discharge and from where:  10/07/2019, [facility]  How have you been since you were released from the hospital? She said that her mother is all right. : [name] took this week off from work to be with her. She is concerned that her mother lays in bed and will only get up to go to the bathroom. She has not been walking around and doesn't want to do anything  [name] has to remind her mother takes her medications.  She said this morning her mother was complaining of pain all over. She took 2 tylenol and his now fine.   Any questions or concerns?  noted above.  Items Reviewed:  Did the pt receive and understand the discharge instructions provided?  Yes, [name] has them and had no questions.   Medications obtained and verified?  [name] said that her mother has all of her medications and Heather, EMT reviewed the med list with her and filled med boxes for 2 weeks.  No questions at this time   Any new allergies since your discharge? none reported   Do you have support at home? she lives with her daughter.   Has been seen by [facility].   Receives weekly paramedicine visits from [employer].   Has hospital bed, rollator.  Has scale but [name] said that her mother will not weigh herself.   Has glucometer  Using O2 @ 4L.  From [employer], has POC.  Needs to contact [employer] about obtaining a new battery for the POC.  As per [name], EMT , [facility] will not be seeing the patient and will not be providing home health PT.  Home health will need to be ordered by PCP.  Functional Questionnaire: (I = Independent and D = Dependent) ADLs: daughter provides needed assistance. This CM explained about PCS but [name] said that her mother would not accept the services.   Follow up appointments  reviewed:   PCP Hospital f/u appt confirmed?Dr Margarita Rana 10/10/2019  [facility] f/u appt confirmed?.cardiology -7/20/210 and neurology - 02/03/2020  Are transportation arrangements needed?  her daughter has arranged for [place] Po[employer] transportation for her mother tomorrow.  She has medicaid and would be eligible for medicaid transportation. A [employer] application has been submitted and [name] said that her mother received a letter from [facility] transportation but she is not sure if its from [employer]  If their condition worsens, is the pt aware to call PCP or go to the Emergency Dept.?  yes  Was the patient provided with contact information for the PCP's office or ED?  she has the phone number for the clinic  Was to pt encouraged to call back with questions or concerns? yes

## 2019-10-10 ENCOUNTER — Other Ambulatory Visit: Payer: Self-pay

## 2019-10-10 ENCOUNTER — Ambulatory Visit: Payer: Medicare Other | Admitting: Family Medicine

## 2019-10-10 MED ORDER — MISC. DEVICES MISC: 0 refills | Status: DC

## 2019-10-10 NOTE — Telephone Encounter (Signed)
Call placed to patient's daughter, [name].  She said that her mother is feeling okay she just did not have enough time to get ready for the appt this morning.  She is requesting an afternoon appt.  Informed her that this CM will need to check the schedule with Dr Margarita Rana and will get back to her.  Dr Margarita Rana  - you do not have an afternoon appt until August. There is an appt available 7/22 @ 1050 that could be virtual.  Please advise what would be best. thanks

## 2019-10-10 NOTE — Telephone Encounter (Signed)
Script has been written and will be faxed over to [facility].

## 2019-10-10 NOTE — Telephone Encounter (Signed)
We could use the virtual morning slot but I was of the opinion she needed to recertify for oxygen as well and might need an in person for that.

## 2019-10-10 NOTE — Telephone Encounter (Signed)
Please advise.  Copied from [place] [id number]. Topic: General - Other >> Oct 10, 2019  9:38 AM Antonieta Iba C wrote: Reason for CRM: pt's daughter [name] called in to request a call back from [facility]. She said that pt had an apt this morning but was unable to make it due to her not feeling well enough.   They would like a call back

## 2019-10-11 NOTE — Telephone Encounter (Signed)
Call placed to patient's daughter, [name] to schedule in person appt as her mother needs to be re-qualified for O2.  appt scheduled for 10/24/2019 @ 1050.   [name] and her family are planning to take her mother to the beach from 7/16-7/18/2021.  They will be taking her O2 with them. She is requesting a wheelchair for the weekend. Informed her that we have a transport chair that she can borrow. [name] said that they would plan to pick it up Monday 10/14/2019.

## 2019-10-14 ENCOUNTER — Other Ambulatory Visit (HOSPITAL_COMMUNITY): Payer: Self-pay

## 2019-10-14 ENCOUNTER — Telehealth: Payer: Self-pay

## 2019-10-14 MED ORDER — MISC. DEVICES MISC
0 refills | Status: DC
Start: 2019-10-14 — End: 2020-03-18

## 2019-10-14 NOTE — Progress Notes (Signed)
Paramedicine Encounter    Patient ID: Kara Hanson, female    DOB: [birth year], 64 y.o.   MRN: [id number]   Patient Care Team: [name], MD as PCP - General (Family Medicine) Jerline Pain, MD as PCP - Cardiology (Cardiology)  Patient Active Problem List   Diagnosis Date Noted  . Palliative care by specialist   . Goals of care, counseling/discussion   . Cardiorenal syndrome   . Hyponatremia 09/26/2019  . CHF exacerbation ([place]) 09/26/2019  . Acute on chronic systolic CHF (congestive heart failure) ([place]) 05/19/2019  . Acute exacerbation of CHF (congestive heart failure) ([place]) 04/18/2019  . Elevated troponin 04/18/2019  . Acute respiratory failure with hypoxia ([place]) 02/09/2019  . NSTEMI (non-ST elevated myocardial infarction) ([place]) 02/09/2019  . S/P thoracentesis   . Atelectasis   . Chronic respiratory failure ([place])   . Acute renal failure superimposed on stage 3 chronic kidney disease ([place])   . Epigastric pain   . CHF (congestive heart failure) ([place]) 01/23/2019  . CAP (community acquired pneumonia) 10/31/2018  . Acute CHF (congestive heart failure) ([place]) 09/24/2018  . SIRS (systemic inflammatory response syndrome) ([place]) 07/25/2018  . COVID-19 virus infection 07/20/2018  . Chronic diastolic CHF (congestive heart failure) ([place]) 07/20/2018  . Pneumonia due to COVID-19 virus 07/20/2018  . Vitamin D deficiency 04/02/2018  . Spinal stenosis 03/29/2018  . Acute on chronic respiratory failure with hypoxia ([place]) 03/20/2018  . Controlled type 2 diabetes with neuropathy ([place]) 03/20/2018  . CKD (chronic kidney disease), stage III 03/20/2018  . Acute on chronic combined systolic and diastolic CHF (congestive heart failure) ([place]) 11/27/2017  . AKI (acute kidney injury) ([place]) 11/27/2017  . Acute respiratory distress 11/27/2017  . Bulging lumbar disc 11/14/2017  . Gout 11/14/2017  . Hyperlipidemia LDL goal <70 09/30/2016  . Chest pain 06/17/2016  . Stress-induced cardiomyopathy  05/19/2016  . Type 2 diabetes mellitus with diabetic neuropathy, unspecified ([place]) 05/06/2016  . Vertigo 05/06/2016  . Controlled type 2 diabetes mellitus with hyperglycemia ([place]) 04/23/2016  . Hypertension 04/23/2016  . Normochromic normocytic anemia 04/23/2016  . Syncope 04/23/2016    Current Outpatient Medications:  .  Accu-Chek FastClix Lancets MISC, USE AS DIRECTED TO TEST BLOOD SUGAR THREE TIMES DAILY, Disp: 102 each, Rfl: 12 .  acetaminophen (TYLENOL) 500 MG tablet, Take 500-1,000 mg by mouth every 6 (six) hours as needed for mild pain or headache., Disp: , Rfl:  .  allopurinol (ZYLOPRIM) 100 MG tablet, Take 2 tablets (200 mg total) by mouth at bedtime., Disp: 30 tablet, Rfl: 1 .  aspirin EC 81 MG tablet, Take 81 mg by mouth in the morning. , Disp: , Rfl:  .  atorvastatin (LIPITOR) 80 MG tablet, Take 80 mg by mouth at bedtime. , Disp: , Rfl:  .  Blood Glucose Monitoring Suppl (ACCU-CHEK GUIDE) w/Device KIT, 1 each by Does not apply route 3 (three) times daily., Disp: 1 kit, Rfl: 0 .  DULoxetine (CYMBALTA) 30 MG capsule, Take 1 capsule (30 mg total) by mouth daily. (Patient taking differently: Take 30 mg by mouth at bedtime. ), Disp: 30 capsule, Rfl: 3 .  EASY COMFORT PEN NEEDLES 31G X 5 MM MISC, USE FOUR TIMES PER DAY FOR INSULIN ADMINISTRATION (Patient taking differently: 4 (four) times daily. ), Disp: 200 each, Rfl: 12 .  gabapentin (NEURONTIN) 100 MG capsule, Take 1 capsule (100 mg total) by mouth at bedtime. (Patient taking differently: Take 100 mg by mouth in the morning and at bedtime. ), Disp:  ,  Rfl:  .  glucose blood (ACCU-CHEK GUIDE) test strip, USE AS DIRECTED TO TEST BLOOD SUGAR THREE TIMES DAILY, Disp: 100 each, Rfl: 12 .  hydrALAZINE (APRESOLINE) 25 MG tablet, Take 0.5 tablets (12.5 mg total) by mouth 3 (three) times daily., Disp: 120 tablet, Rfl: 11 .  insulin aspart (NOVOLOG) 100 UNIT/ML injection, 0 to 12 units subcutaneously 3 times daily before meals as per sliding  scale (Patient taking differently: Inject 0-12 Units into the skin See admin instructions. Inject 0-12 units into the skin three times a day before meals, per sliding scale), Disp: 30 mL, Rfl: 3 .  insulin glargine (LANTUS) 100 UNIT/ML injection, Inject 0.05 mLs (5 Units total) into the skin at bedtime., Disp: 10 mL, Rfl: 11 .  Lancet Device MISC, Use as instructed 3 times daily, Disp: 1 each, Rfl: 0 .  lansoprazole (PREVACID) 15 MG capsule, TAKE 1 CAPSULE (15 MG TOTAL) BY MOUTH DAILY. (Patient taking differently: Take 15 mg by mouth daily before breakfast. ), Disp: 30 capsule, Rfl: 2 .  Misc. Devices MISC, Portable oxygen concentrator.  Diagnosis-chronic respiratory failure., Disp: 1 each, Rfl: 0 .  Misc. Devices MISC, Roll[place] with seat. Dx: Congestive Heart Failure, Disp: 1 each, Rfl: 0 .  Misc. Devices MISC, Scale; Dx - CHF, Disp: 1 each, Rfl: 0 .  Misc. Devices [facility] bed.  Diagnosis CHF.  Lifetime use.  Weight 165 lbs., Disp: 1 each, Rfl: 0 .  Misc. Devices MISC, Shower chair DX CHF, Disp: 1 each, Rfl: 0 .  Multiple Vitamins-Minerals (MULTIVITAMIN WITH MINERALS) tablet, Take 1 tablet by mouth daily with breakfast. , Disp: , Rfl:  .  OXYGEN, Inhale 4 L/min into the lungs continuous., Disp: , Rfl:  .  primidone (MYSOLINE) 50 MG tablet, Take 1 tablet (50 mg total) by mouth in the morning and at bedtime., Disp: 180 tablet, Rfl: 1 .  sodium chloride (OCEAN) 0.65 % SOLN nasal spray, Place 1 spray into both nostrils as needed for congestion. , Disp: , Rfl:  .  SUPER B COMPLEX/C CAPS, Take 1 capsule by mouth daily. , Disp: , Rfl:  .  torsemide (DEMADEX) 20 MG tablet, Take 2 tablets (40 mg total) by mouth 2 (two) times daily., Disp: 120 tablet, Rfl: 3 .  ergocalciferol (VITAMIN D2) 1.25 MG (50000 UT) capsule, Take 50,000 Units by mouth every Sunday.  (Patient not taking: Reported on 10/08/2019), Disp: , Rfl:  .  polyethylene glycol (MIRALAX / GLYCOLAX) 17 g packet, Take 17 g by mouth daily as  needed for moderate constipation. (Patient not taking: Reported on 10/08/2019), Disp: , Rfl:  Allergies  Allergen Reactions  . Garlic Shortness Of Breath, Itching, Swelling and Other (See Comments)    "Raw garlic" = Hand itching and swelling  . Isosorbide Other (See Comments)    Sneezing and runny nose  . Latex Itching  . Morphine And Related Itching and Other (See Comments)    Headache   . Other Itching and Other (See Comments)    Reaction to newspaper ink- Headaches, also      Social History   Socioeconomic History  . Marital status: Widowed    Spouse name: Not on file  . Number of children: Not on file  . Years of education: Not on file  . Highest education level: Not on file  Occupational History  . Not on file  Tobacco Use  . Smoking status: Never Smoker  . Smokeless tobacco: Never Used  Vaping Use  . Vaping  Use: Never used  Substance and Sexual Activity  . Alcohol use: No  . Drug use: No  . Sexual activity: Not Currently    Birth control/protection: None  Other Topics Concern  . Not on file  Social History Narrative  . Not on file   Social Determinants of Health   Financial Resource Strain:   . Difficulty of Paying Living Expenses:   Food Insecurity:   . Worried About Charity fundraiser in the Last Year:   . Arboriculturist in the Last Year:   Transportation Needs:   . Film/video editor (Medical):   Marland Kitchen Lack of Transportation (Non-Medical):   Physical Activity:   . Days of Exercise per Week:   . Minutes of Exercise per Session:   Stress:   . Feeling of Stress :   Social Connections:   . Frequency of Communication with Friends and Family:   . Frequency of Social Gatherings with Friends and Family:   . Attends Religious Services:   . Active Member of Clubs or Organizations:   . Attends Archivist Meetings:   Marland Kitchen Marital Status:   Intimate Partner Violence:   . Fear of Current or Ex-Partner:   . Emotionally Abused:   Marland Kitchen Physically Abused:    . Sexually Abused:     Physical Exam      Future Appointments  Date Time Provider [place]  10/22/2019  2:45 PM Burtis Junes, NP [facility]  10/24/2019 10:50 AM [name], MD [facility] None  02/03/2020  8:15 AM Tat, Eustace Quail, DO [facility] None    BP (!) 150/80   Pulse 78   Temp (!) 97.3 F (36.3 C)   Resp 20   SpO2 99%   Weight yesterday-? Last visit weight-140  pts daughter called me to report she has been itching since about 1-2 days after she left the hosp. She thought it was an allergy to a med but she hasnt taken meds in 2 days b/c of that and she also has been taking allegra. She has no hives,no welps, no rash, no redness, itching is in different places at times, no increased sob, no swelling to tongue or lips. She is not specific on when exactly it started, her complaint time has varied while talking to her and daughter is not sure exactly either. It has ranged from itching started while she was in hosp to when she got out to started the other day so onset is very unclear. Family denies changing detergents, shampoos, bedding, clothes anything like that, that could cause itching or discomfort.  She also expressed the fact that she cant make it to car from house without giving out so a wheelchair request was asked by her PCP and that way thye can use it when they go to beach this wknd as well.  Will forward this concern to her palliative care nurse to keep her in loop as well.   Marylouise Stacks, [facility] Paramedic  10/14/19

## 2019-10-14 NOTE — Telephone Encounter (Signed)
Call received from Marylouise Stacks, EMT noting that the patient is requesting an order for a wheelchair. They do not want to use the transport chair.  They have already contacted [facility] and they request that the order be sent there.

## 2019-10-14 NOTE — Telephone Encounter (Signed)
Call received from Marylouise Stacks, EMT noting that [facility] is not able to bill patient's insurance for the wheelchair. Order then faxed to [facility]

## 2019-10-14 NOTE — Telephone Encounter (Signed)
Order has been written.

## 2019-10-15 ENCOUNTER — Telehealth (HOSPITAL_COMMUNITY): Payer: Self-pay

## 2019-10-15 ENCOUNTER — Telehealth: Payer: Self-pay

## 2019-10-15 NOTE — Telephone Encounter (Signed)
Jane sent order to [facility] since [facility] was not able to bill her [employer] for wheelchair. [facility] does have the order and it is scheduled for delivery to her on 7/14.  I let daughter know to be on look out for delivery tomor.    Marylouise Stacks, EMT-Paramedic  10/15/19

## 2019-10-15 NOTE — Telephone Encounter (Signed)
Call placed to [facility] to check on status of order for the wheelchair.   Spoke to [place] who said that her insurance paid for a new wheelchair for the patient 08/2018 and they will not pay for another one.  They tried to contact the patient and left messages on each of her numbers requesting she call to confirm if she would like to pay for the wheelchair.  This CM spoke to patient's daughter, [name] , and informed her of the above.  Provided her with the phone number for [facility] to call and inquire about the price of the wheelchair.   Update provided to Marylouise Stacks, EMT

## 2019-10-15 NOTE — Progress Notes (Deleted)
CARDIOLOGY OFFICE NOTE  Date:  10/15/2019    Kara Hanson Date of Birth: 1955/09/29 Medical Record #[id number]  PCP:  [name], MD  Cardiologist:  Servando Sn[facility]   No chief complaint on file.   History of Present Illness: Kara Hanson is a 64 y.o. female who presents today for a post hospital visit. Seen for Dr. Marlou Porch and [place].   She has a history of HTN, HLD, IDDM, Stage 3 CKD,chronic systolic and diastolicheart failure, NICM, morbid obesity, chronic hypoxemic respiratory failure on oxygen and history of poor med compliance. She is followed by Paramedicine. Multiple admissions over the last year for acute on chronic CHF.  She has had normal coronaries by prior cath. She has had prior COVID pneumonia. Spinal stenosis limits her walking. EF of 40 to 45%.   Last saw Dr. Marlou Porch in April.   Admitted this past month - EF down to 20 to 25% - viral cardiomyopathy is to be considered - she had had COVID penumonia in April and EF was normal then. Required Milrinone. PYP scan to rule out amyloid was negative. Cardiac MR with EF 26%.   Has not been felt to be a good candidate for advanced therapies given her poor compliance. Palliative care was consulted. She remains full cose and with full scope of care. DNR was recommended.   Comes in today. Here with   Past Medical History:  Diagnosis Date  . Acute CHF (congestive heart failure) ([place]) 09/24/2018  . Acute on chronic combined systolic and diastolic CHF (congestive heart failure) ([place]) 11/27/2017  . Acute on chronic respiratory failure with hypoxia ([place]) 03/20/2018  . Acute renal failure superimposed on stage 3 chronic kidney disease ([place])   . Acute respiratory distress 11/27/2017  . Acute respiratory failure with hypoxia ([place]) 02/09/2019  . Acute systolic CHF (congestive heart failure) ([place]) 05/19/2019  . AKI (acute kidney injury) ([place]) 11/27/2017  . Atelectasis   . Bulging lumbar disc 11/14/2017  . CAP  (community acquired pneumonia) 10/31/2018  . Chest pain 06/17/2016  . CHF (congestive heart failure) ([place])   . Chronic diastolic CHF (congestive heart failure) ([place]) 07/20/2018  . Chronic respiratory failure ([place])   . CKD (chronic kidney disease), stage III 03/20/2018  . Controlled type 2 diabetes mellitus with hyperglycemia ([place]) 04/23/2016  . Controlled type 2 diabetes with neuropathy ([place]) 03/20/2018  . COVID-19 virus infection 07/20/2018  . CVA (cerebral vascular accident) ([place])   . Diabetes mellitus without complication ([place])   . Dyspnea   . Elevated troponin 04/18/2019  . Epigastric pain   . Gout 11/14/2017  . Hypercholesteremia   . Hyperlipidemia LDL goal <70 09/30/2016  . Hypertension   . Myocardial infarction ([place])   . Normochromic normocytic anemia 04/23/2016  . NSTEMI (non-ST elevated myocardial infarction) ([place]) 02/09/2019  . Pneumonia 11/01/2018  . Pneumonia due to COVID-19 virus 07/20/2018  . S/P thoracentesis   . SIRS (systemic inflammatory response syndrome) ([place]) 07/25/2018  . Spinal stenosis   . Stress-induced cardiomyopathy 05/19/2016  . Syncope 04/23/2016  . Type 2 diabetes mellitus with diabetic neuropathy, unspecified ([place]) 05/06/2016  . Vertigo 05/06/2016  . Vitamin D deficiency 04/02/2018    Past Surgical History:  Procedure Laterality Date  . BIOPSY  01/27/2019   Procedure: BIOPSY;  Surgeon: Otis Brace, MD;  Location: [facility];  Service: Gastroenterology;;  . BLADDER SURGERY    . CARDIAC CATHETERIZATION N/A 04/25/2016   Procedure: Left Heart Cath and Coronary Angiography;  Surgeon: Roderic Palau  Adora Fridge, MD;  Location: [facility];  Service: Cardiovascular;  Laterality: N/A;  . CARDIAC CATHETERIZATION  02/11/2019  . CESAREAN SECTION    . CHOLECYSTECTOMY    . ESOPHAGOGASTRODUODENOSCOPY (EGD) WITH PROPOFOL N/A 01/27/2019   Procedure: ESOPHAGOGASTRODUODENOSCOPY (EGD) WITH PROPOFOL;  Surgeon: Otis Brace, MD;  Location: [facility];  Service:  Gastroenterology;  Laterality: N/A;  . RIGHT HEART CATH N/A 09/30/2019   Procedure: RIGHT HEART CATH;  Surgeon: Jolaine Artist, MD;  Location: [facility];  Service: Cardiovascular;  Laterality: N/A;  . RIGHT/LEFT HEART CATH AND CORONARY ANGIOGRAPHY N/A 02/11/2019   Procedure: RIGHT/LEFT HEART CATH AND CORONARY ANGIOGRAPHY;  Surgeon: Jolaine Artist, MD;  Location: [facility];  Service: Cardiovascular;  Laterality: N/A;  . VIDEO BRONCHOSCOPY N/A 02/01/2019   Procedure: VIDEO BRONCHOSCOPY WITHOUT FLUORO;  Surgeon: Candee Furbish, MD;  Location: [facility];  Service: Endoscopy;  Laterality: N/A;     Medications: No outpatient medications have been marked as taking for the 10/22/19 encounter (Appointment) with Burtis Junes, NP.     Allergies: Allergies  Allergen Reactions  . Garlic Shortness Of Breath, Itching, Swelling and Other (See Comments)    "Raw garlic" = Hand itching and swelling  . Isosorbide Other (See Comments)    Sneezing and runny nose  . Latex Itching  . Morphine And Related Itching and Other (See Comments)    Headache   . Other Itching and Other (See Comments)    Reaction to newspaper ink- Headaches, also    Social History: The patient  reports that she has never smoked. She has never used smokeless tobacco. She reports that she does not drink alcohol and does not use drugs.   Family History: The patient's ***family history includes Diabetes Mellitus II in her father; Healthy in her mother; Stroke in her father.   Review of Systems: Please see the history of present illness.   All other systems are reviewed and negative.   Physical Exam: VS:  There were no vitals taken for this visit. Marland Kitchen  BMI There is no height or weight on file to calculate BMI.  Wt Readings from Last 3 Encounters:  10/09/19 140 lb (63.5 kg)  10/08/19 140 lb (63.5 kg)  10/07/19 141 lb 6.4 oz (64.1 kg)    General: Pleasant. Well developed, well nourished and in no  acute distress.   HEENT: Normal.  Neck: Supple, no JVD, carotid bruits, or masses noted.  Cardiac: ***Regular rate and rhythm. No murmurs, rubs, or gallops. No edema.  Respiratory:  Lungs are clear to auscultation bilaterally with normal work of breathing.  GI: Soft and nontender.  MS: No deformity or atrophy. Gait and ROM intact.  Skin: Warm and dry. Color is normal.  Neuro:  Strength and sensation are intact and no gross focal deficits noted.  Psych: Alert, appropriate and with normal affect.   LABORATORY DATA:  EKG:  EKG {ACTION; IS/IS [id number]:[id number]} ordered today.  Personally reviewed by me. This demonstrates ***.  Lab Results  Component Value Date   WBC 5.4 10/05/2019   HGB 9.5 (L) 10/05/2019   HCT 30.9 (L) 10/05/2019   PLT 301 10/05/2019   GLUCOSE 212 (H) 10/07/2019   CHOL 353 (H) 01/09/2019   TRIG 108 02/01/2019   HDL 52 01/09/2019   LDLCALC 251 (H) 01/09/2019   ALT 14 09/26/2019   AST 20 09/26/2019   NA 133 (L) 10/07/2019   K 4.5 10/07/2019   CL 96 (L)  10/07/2019   CREATININE 2.25 (H) 10/07/2019   BUN 53 (H) 10/07/2019   CO2 28 10/07/2019   TSH 3.156 09/26/2019   INR 1.1 02/09/2019   HGBA1C 7.1 (H) 09/26/2019     BNP (last 3 results) Recent Labs    05/18/19 0926 07/01/19 1244 09/26/19 1427  BNP 875.8* 1,267.1* 1,420.6*    ProBNP (last 3 results) No results for input(s): PROBNP in the last 8760 hours.   Other Studies Reviewed Today:  ECHO 2021  1. Left ventricular ejection fraction, by visual estimation, is 40 to  45%. The left ventricle has mild to moderately decreased function. There  is mildly increased left ventricular wall thickness.  2. The left ventricle demonstrates global hypokinesis.  3. Global right ventricle has normal systolc function.The right  ventricular size is normal.  4. Aortic stenosis was incompletely evaluated but likely mild aortic  valve stenosis (MG 65mmHg, AVA 1.3 cm^2, DI 0.45)  5. The inferior vena cava is normal  in size with <50% respiratory  variability, suggesting right atrial pressure of 8 mmHg.   Cath 2020: 1. Mild non-obstructive CAD with small coronary arteries 2. Moderate to severe NICM with EF 30% by echo 3. No significant aortic valve gradient on pullback  4. Elevated filling pressures with normal cardiac output    ASSESSMENT & PLAN:     1. Acute on Chronic Combined Systolic and Diastolic CHF - NICM - Echo in 2018 w/ EF 30-35% (LHC w/ normal cors and normal EF by LVG 50-55%) - Echo 11/2017 & 10/2018 LVEF 50-55% - Echo 01/2019 EF 40-45% - Echo 02/2019 EF 30-35%, G3DD. RV ok. R/LHC 11/20 w/ mild CAD and normal CO - Echo 09/2019 EF 20-25%, G3DD (restrictive), RV normal.  -RHC 09/30/2019 with elevated filling pressures and low output HF - Viral cardiomyopathy as she did have COVID PNA 07/2018 and EF had been normal prior to this - MRI 10/01/19 w/ LVEF 26%, RVEF 46%, no subendocardial delayed myocardial enhancement, ECV is 29%, and no evidence of cardiac amyloidosis. -PYP scan 10/02/2019: Equivocal for TTR amyloidosis doubt TTR given MRI negative for amyloid,   - h/o poor med compliance (followed by paramedicine) w/ multiple admissions for a/c CHF in the last 12 months - Myeloma Panel appears unremarkable. Evidence of  monoclonal protein is not apparent.  - Milrinone added post cath. Improved diuresis noted. Remains on milrinone 0.125. Will stop today - Volume status ok. Continue to hold diuretics one more day.  - no ARB/ ARNI/ Spiro/ Digoxin w/ CKD - continue hydralazine. Intolerant to Imdur  - consider SGLT2i. Her GFR is > 20  - needs to improve compliance w/ home meds. Continue paramedcine once discharged - not a good candidate for advanced therapies given h/o poor compliance - she has very advanced HF. At high risk for recurrent decompensation off milrinone - palliative care now following. I had long discussion with patient and her daughter on 7/2. If fails milrinone wean would switch  to Hospice   2. Stage III CKD - Prior SCr in March 2021 was 1.77  - SCr on this admit elevated at 2.47. Was down to 1.83 after addition of milrinone but now back up to 2.3 - diuretics currently on hold. Creatinine has plateaued - Hold diuretics one more day - renal US negative  - nephrology following   3. IDDM  - on insulinand controlled - hgb A1c 7.1   4. Hypertension  - Blood pressure well controlled. Continue current regimen.  5. Obesity  -  Body mass index is 29.72 kg/m. - educated on healthy diet   6. Poor Med Compliance - continue w/ paramedicine once discharged  7. Chronic Hypoxic Respiratory Failure - On 4 liters prior to admit. Sats stable.   8. Anemia  -Hgb stable - iron 25 on 6/28, given Faraheme 6/29, iron 6/30 was 338  Current medicines are reviewed with the patient today.  The patient does not have concerns regarding medicines other than what has been noted above.  The following changes have been made:  See above.  Labs/ tests ordered today include:   No orders of the defined types were placed in this encounter.    Disposition:   FU with *** in {gen number [id number]} {Days to years:10300}.   Patient is agreeable to this plan and will call if any problems develop in the interim.   SignedTruitt Merle, NP  10/15/2019 9:04 AM  [facility] [address] [place], [place]  [address] Phone: [phone] Fax: [phone]

## 2019-10-16 ENCOUNTER — Other Ambulatory Visit: Payer: Self-pay | Admitting: Family Medicine

## 2019-10-16 NOTE — Telephone Encounter (Signed)
Requested medication (s) are due for refill today: Yes  Requested medication (s) are on the active medication list: Yes  Last refill:  05/22/19  Future visit scheduled: No  Notes to clinic:  Filled last by ED provider, unable to refill per protocol     Requested Prescriptions  Pending Prescriptions Disp Refills   gabapentin (NEURONTIN) 100 MG capsule [Pharmacy Med Name: GABAPENTIN 100MG  CAPSULE] 60 capsule     Sig: TAKE 1 CAPSULE (100 MG TOTAL) BY MOUTH 2 (TWO) TIMES DAILY.      Neurology: Anticonvulsants - gabapentin Passed - 10/16/2019  5:28 PM      Passed - Valid encounter within last 12 months    Recent Outpatient Visits           3 months ago Diabetes mellitus with gastroparesis ([place])   [place], [place], MD   4 months ago Type 2 diabetes mellitus with diabetic neuropathy, with long-term current use of insulin ([place])   [place], [place], MD   4 months ago Type 2 diabetes mellitus with diabetic neuropathy, with long-term current use of insulin ([place])   [place]   5 months ago Normochromic normocytic anemia   [place], [place], MD   7 months ago Type 2 diabetes mellitus with diabetic neuropathy, with long-term current use of insulin ([place])   [place], [place], MD       Future Appointments             In 6 days Burtis Junes, NP [place], [facility]   In 1 week [name], MD [place]

## 2019-10-21 ENCOUNTER — Telehealth: Payer: Self-pay

## 2019-10-21 ENCOUNTER — Telehealth (HOSPITAL_COMMUNITY): Payer: Self-pay | Admitting: Vascular Surgery

## 2019-10-21 NOTE — Telephone Encounter (Signed)
Call received from [name], EMT.  She stated that she received a message from patient's daughter, [name] who reported that the patient has been complaining of generalized itching x 1.5 weeks. There is no rash/lesions visible , no shortness of breath, no fever. She continues to remain in bed most of the time. She was seen by Marylouise Stacks , EMT last week and this was not reported at that time.  Informed her that PCP will be notified

## 2019-10-21 NOTE — Telephone Encounter (Signed)
I will address at upcoming office visit in 3 days

## 2019-10-21 NOTE — Telephone Encounter (Signed)
Please see message from Dr Margarita Rana

## 2019-10-21 NOTE — Telephone Encounter (Signed)
Left pt message giving [place] f/u 8/31 @ 10:30 AM

## 2019-10-22 ENCOUNTER — Telehealth (HOSPITAL_COMMUNITY): Payer: Self-pay

## 2019-10-22 ENCOUNTER — Ambulatory Visit: Payer: Medicare Other | Admitting: Nurse Practitioner

## 2019-10-22 NOTE — Telephone Encounter (Signed)
Received a call from patient's daughter [name] who reports patient is having trouble breathing and is really upset because she is itching all over, with no hives, throat swelling or GI upset. I called and spoke to patient she says she is itching and it's making her upset causing her breathing to get hard. I advised the importance of slowing her breathing and to take her Allegra which has been helping with her itching symptoms. Patient understood and agreed. Patient will see Dr. Margarita Rana in two days. I advised if the symptoms worsened to call 911 for transport if needed. Call complete.

## 2019-10-24 ENCOUNTER — Other Ambulatory Visit: Payer: Self-pay

## 2019-10-24 ENCOUNTER — Ambulatory Visit: Payer: Medicare Other | Attending: Family Medicine | Admitting: Family Medicine

## 2019-10-24 ENCOUNTER — Telehealth: Payer: Self-pay | Admitting: Family Medicine

## 2019-10-24 VITALS — BP 163/86 | HR 125 | Wt 142.0 lb

## 2019-10-24 DIAGNOSIS — Z794 Long term (current) use of insulin: Secondary | ICD-10-CM | POA: Diagnosis not present

## 2019-10-24 DIAGNOSIS — E1140 Type 2 diabetes mellitus with diabetic neuropathy, unspecified: Secondary | ICD-10-CM | POA: Diagnosis not present

## 2019-10-24 DIAGNOSIS — I110 Hypertensive heart disease with heart failure: Secondary | ICD-10-CM

## 2019-10-24 DIAGNOSIS — J9611 Chronic respiratory failure with hypoxia: Secondary | ICD-10-CM

## 2019-10-24 DIAGNOSIS — I5022 Chronic systolic (congestive) heart failure: Secondary | ICD-10-CM

## 2019-10-24 DIAGNOSIS — E1121 Type 2 diabetes mellitus with diabetic nephropathy: Secondary | ICD-10-CM | POA: Diagnosis not present

## 2019-10-24 DIAGNOSIS — G4489 Other headache syndrome: Secondary | ICD-10-CM | POA: Diagnosis not present

## 2019-10-24 DIAGNOSIS — N1832 Chronic kidney disease, stage 3b: Secondary | ICD-10-CM | POA: Diagnosis not present

## 2019-10-24 DIAGNOSIS — G4709 Other insomnia: Secondary | ICD-10-CM

## 2019-10-24 MED ORDER — INSULIN ASPART 100 UNIT/ML ~~LOC~~ SOLN: SUBCUTANEOUS | 3 refills | Status: DC

## 2019-10-24 MED ORDER — BASAGLAR KWIKPEN 100 UNIT/ML ~~LOC~~ SOPN: 5.0000 [IU] | PEN_INJECTOR | Freq: Every day | SUBCUTANEOUS | 6 refills | Status: DC

## 2019-10-24 NOTE — Progress Notes (Signed)
Paramedicine Encounter    Patient ID: Kara Hanson, female    DOB: 06/06/1955, 64 y.o.   MRN: [id number]   Patient Care Team: [name], MD as PCP - General (Family Medicine) Jerline Pain, MD as PCP - Cardiology (Cardiology)  Patient Active Problem List   Diagnosis Date Noted   Palliative care by specialist    Goals of care, counseling/discussion    Cardiorenal syndrome    Hyponatremia 09/26/2019   CHF exacerbation ([employer]) 09/26/2019   Acute on chronic systolic CHF (congestive heart failure) ([place]) 05/19/2019   Acute exacerbation of CHF (congestive heart failure) ([place]) 04/18/2019   Elevated troponin 04/18/2019   Acute respiratory failure with hypoxia ([place]) 02/09/2019   NSTEMI (non-ST elevated myocardial infarction) ([place]) 02/09/2019   S/P thoracentesis    Atelectasis    Chronic respiratory failure ([place])    Acute renal failure superimposed on stage 3 chronic kidney disease ([place])    Epigastric pain    CHF (congestive heart failure) ([place]) 01/23/2019   CAP (community acquired pneumonia) 10/31/2018   Acute CHF (congestive heart failure) ([place]) 09/24/2018   SIRS (systemic inflammatory response syndrome) ([place]) 07/25/2018   COVID-19 virus infection 07/20/2018   Chronic diastolic CHF (congestive heart failure) ([place]) 07/20/2018   Pneumonia due to COVID-19 virus 07/20/2018   Vitamin D deficiency 04/02/2018   Spinal stenosis 03/29/2018   Acute on chronic respiratory failure with hypoxia ([place]) 03/20/2018   Controlled type 2 diabetes with neuropathy ([place]) 03/20/2018   CKD (chronic kidney disease), stage III 03/20/2018   Acute on chronic combined systolic and diastolic CHF (congestive heart failure) ([place]) 11/27/2017   AKI (acute kidney injury) ([place]) 11/27/2017   Acute respiratory distress 11/27/2017   Bulging lumbar disc 11/14/2017   Gout 11/14/2017   Hyperlipidemia LDL goal <70 09/30/2016   Chest pain 06/17/2016   Stress-induced cardiomyopathy  05/19/2016   Type 2 diabetes mellitus with diabetic neuropathy, unspecified ([place]) 05/06/2016   Vertigo 05/06/2016   Controlled type 2 diabetes mellitus with hyperglycemia ([place]) 04/23/2016   Hypertension 04/23/2016   Normochromic normocytic anemia 04/23/2016   Syncope 04/23/2016    Current Outpatient Medications:    Accu-Chek FastClix Lancets MISC, USE AS DIRECTED TO TEST BLOOD SUGAR THREE TIMES DAILY, Disp: 102 each, Rfl: 12   acetaminophen (TYLENOL) 500 MG tablet, Take 500-1,000 mg by mouth every 6 (six) hours as needed for mild pain or headache., Disp: , Rfl:    allopurinol (ZYLOPRIM) 100 MG tablet, Take 2 tablets (200 mg total) by mouth at bedtime., Disp: 30 tablet, Rfl: 1   aspirin EC 81 MG tablet, Take 81 mg by mouth in the morning. , Disp: , Rfl:    atorvastatin (LIPITOR) 80 MG tablet, Take 80 mg by mouth at bedtime. , Disp: , Rfl:    Blood Glucose Monitoring Suppl (ACCU-CHEK GUIDE) w/Device KIT, 1 each by Does not apply route 3 (three) times daily., Disp: 1 kit, Rfl: 0   DULoxetine (CYMBALTA) 30 MG capsule, Take 1 capsule (30 mg total) by mouth daily. (Patient taking differently: Take 30 mg by mouth at bedtime. ), Disp: 30 capsule, Rfl: 3   EASY COMFORT PEN NEEDLES 31G X 5 MM MISC, USE FOUR TIMES PER DAY FOR INSULIN ADMINISTRATION (Patient taking differently: 4 (four) times daily. ), Disp: 200 each, Rfl: 12   ergocalciferol (VITAMIN D2) 1.25 MG (50000 UT) capsule, Take 50,000 Units by mouth every Sunday. , Disp: , Rfl:    gabapentin (NEURONTIN) 100 MG capsule, TAKE 1 CAPSULE (100 MG  TOTAL) BY MOUTH 2 (TWO) TIMES DAILY., Disp: 60 capsule, Rfl: 2   glucose blood (ACCU-CHEK GUIDE) test strip, USE AS DIRECTED TO TEST BLOOD SUGAR THREE TIMES DAILY, Disp: 100 each, Rfl: 12   hydrALAZINE (APRESOLINE) 25 MG tablet, Take 0.5 tablets (12.5 mg total) by mouth 3 (three) times daily., Disp: 120 tablet, Rfl: 11   insulin aspart (NOVOLOG) 100 UNIT/ML injection, 0 to 12 units  subcutaneously 3 times daily before meals as per sliding scale, Disp: 30 mL, Rfl: 3   Insulin Glargine (BASAGLAR KWIKPEN) 100 UNIT/ML, Inject 0.05 mLs (5 Units total) into the skin daily., Disp: 30 mL, Rfl: 6   Lancet Device MISC, Use as instructed 3 times daily, Disp: 1 each, Rfl: 0   lansoprazole (PREVACID) 15 MG capsule, TAKE 1 CAPSULE (15 MG TOTAL) BY MOUTH DAILY. (Patient taking differently: Take 15 mg by mouth daily before breakfast. ), Disp: 30 capsule, Rfl: 2   Misc. Devices MISC, Portable oxygen concentrator.  Diagnosis-chronic respiratory failure., Disp: 1 each, Rfl: 0   Misc. Devices MISC, Rollaor with seat. Dx: Congestive Heart Failure, Disp: 1 each, Rfl: 0   Misc. Devices MISC, Scale; Dx - CHF, Disp: 1 each, Rfl: 0   Misc. Devices [place] [facility] bed.  Diagnosis CHF.  Lifetime use.  Weight 165 lbs., Disp: 1 each, Rfl: 0   Misc. Devices MISC, Shower chair DX CHF, Disp: 1 each, Rfl: 0   Misc. Devices MISC, Manual wheelchai with cushion, anti - tippers, brakes and lock.  Diagnosis congestive heart failure.  Weight 140 pounds., Disp: 1 each, Rfl: 0   Multiple Vitamins-Minerals (MULTIVITAMIN WITH MINERALS) tablet, Take 1 tablet by mouth daily with breakfast. , Disp: , Rfl:    OXYGEN, Inhale 4 L/min into the lungs continuous., Disp: , Rfl:    polyethylene glycol (MIRALAX / GLYCOLAX) 17 g packet, Take 17 g by mouth daily as needed for moderate constipation. (Patient not taking: Reported on 10/08/2019), Disp: , Rfl:    primidone (MYSOLINE) 50 MG tablet, Take 1 tablet (50 mg total) by mouth in the morning and at bedtime., Disp: 180 tablet, Rfl: 1   sodium chloride (OCEAN) 0.65 % SOLN nasal spray, Place 1 spray into both nostrils as needed for congestion. , Disp: , Rfl:    SUPER B COMPLEX/C CAPS, Take 1 capsule by mouth daily. , Disp: , Rfl:    torsemide (DEMADEX) 20 MG tablet, Take 2 tablets (40 mg total) by mouth 2 (two) times daily., Disp: 120 tablet, Rfl: 3 Allergies  Allergen  Reactions   Garlic Shortness Of Breath, Itching, Swelling and Other (See Comments)    "Raw garlic" = Hand itching and swelling   Isosorbide Other (See Comments)    Sneezing and runny nose   Latex Itching   Morphine And Related Itching and Other (See Comments)    Headache    Other Itching and Other (See Comments)    Reaction to newspaper ink- Headaches, also     Social History   Socioeconomic History   Marital status: Widowed    Spouse name: Not on file   Number of children: Not on file   Years of education: Not on file   Highest education level: Not on file  Occupational History   Not on file  Tobacco Use   Smoking status: Never Smoker   Smokeless tobacco: Never Used  Vaping Use   Vaping Use: Never used  Substance and Sexual Activity   Alcohol use: No   Drug use: No  Sexual activity: Not Currently    Birth control/protection: None  Other Topics Concern   Not on file  Social History Narrative   Not on file   Social Determinants of Health   Financial Resource Strain:    Difficulty of Paying Living Expenses:   Food Insecurity:    Worried About Charity fundraiser in the Last Year:    Arboriculturist in the Last Year:   Transportation Needs:    Film/video editor (Medical):    Lack of Transportation (Non-Medical):   Physical Activity:    Days of Exercise per Week:    Minutes of Exercise per Session:   Stress:    Feeling of Stress :   Social Connections:    Frequency of Communication with Friends and Family:    Frequency of Social Gatherings with Friends and Family:    Attends Religious Services:    Active Member of Clubs or Organizations:    Attends Archivist Meetings:    Marital Status:   Intimate Partner Violence:    Fear of Current or Ex-Partner:    Emotionally Abused:    Physically Abused:    Sexually Abused:     Physical Exam   Saw Audianna in clinic today with Dr. Margarita Rana where Talia was  complaining of feeling restless at night, itching all over with shortness of breath. Ermine reports she has been taking Allegra which helps with the itching. I added an Allegra daily to her pill box. Dr. Margarita Rana suggested Melatonin at 83m to help with sleeping at night time. JJanett Billow patient's daughter agreed and stated she will pick some up. Patient needs to reschedule cardiology appointment with Dr. SMarlou Porchoffice. I will schedule same for her. Patient and daughter were informed of PCS services and how they would benefit the patient. Patient and daughter agreed with same. I informed [facility]Opal [place] [facility] Patient's medications reviewed and placed in pill box accordingly. Dr. NMargarita Ranais sending in request for hands free glucometer. Insulin refills also sent in. I will educate patient on insulin administration next week at home visit. Patient's daughter was informed by Dr. NMargarita Ranathat the patient could use Glucerna shakes for meal replacements. Clinic visit complete. I will see patient in one week.   Refills: Gabapentin    Future Appointments  Date Time Provider [facility] [place] 12/03/2019 10:30 AM [facility] PA/NP [facility] None  02/03/2020  8:15 AM Tat, REustace Quail DO [facility] None     ACTION: Next visit planned for one week

## 2019-10-24 NOTE — Telephone Encounter (Signed)
[employer] will be called patient no longer needs wheelchair from them.  Patient daughter brought her a wheelchair.

## 2019-10-24 NOTE — Progress Notes (Signed)
Subjective:  Patient ID: Kara Hanson, female    DOB: 18-Jan-1956  Age: 64 y.o. MRN: [id number]  CC: Hospitalization Follow-up   HPI Kara Hanson is a 64 year old female with a history of type 2 diabetes mellitus (A1c 7.4), hypertension, congestive heart failure (EF20 to 25% from echo of 09/2019 down from 40-45% from echo of1/2021, chronic respiratory failure on 4 L of oxygen, COVID-19 related pneumonia in 07/2018,multiple hospitalizations for CHF exacerbation seen for follow-up visit.  Last hospitalization was from 09/26/2019 through 10/07/2019. Accompanied by her daughter and Joellen Jersey the paramedicine EMT.  She has had a headache since last night.  Daughter states she does not sleep at night but during the day he is asleep a lot.  She is currently on gabapentin but takes just 200 mg at bedtime. She has dyspnea even at rest.  Attempt to take off oxygen support from the oxygen saturation test required for qualification got her real dyspneic and tachycardic.  Her oxygen saturation dropped to 65% on room air.  Currently under Palliative Care.  She needs a CGM due to tremors she is unable to poke her finger Food makes her nauseous and she has not been compliant with her insulin. She forgets to take her medications she states.  Currently resides with her daughter and granddaughter.  Past Medical History:  Diagnosis Date  . Acute CHF (congestive heart failure) ([place]) 09/24/2018  . Acute on chronic combined systolic and diastolic CHF (congestive heart failure) ([place]) 11/27/2017  . Acute on chronic respiratory failure with hypoxia ([place]) 03/20/2018  . Acute renal failure superimposed on stage 3 chronic kidney disease ([place])   . Acute respiratory distress 11/27/2017  . Acute respiratory failure with hypoxia ([place]) 02/09/2019  . Acute systolic CHF (congestive heart failure) ([place]) 05/19/2019  . AKI (acute kidney injury) ([place]) 11/27/2017  . Atelectasis   . Bulging lumbar disc 11/14/2017  . CAP (community  acquired pneumonia) 10/31/2018  . Chest pain 06/17/2016  . CHF (congestive heart failure) ([place])   . Chronic diastolic CHF (congestive heart failure) ([place]) 07/20/2018  . Chronic respiratory failure ([place])   . CKD (chronic kidney disease), stage III 03/20/2018  . Controlled type 2 diabetes mellitus with hyperglycemia ([place]) 04/23/2016  . Controlled type 2 diabetes with neuropathy ([place]) 03/20/2018  . COVID-19 virus infection 07/20/2018  . CVA (cerebral vascular accident) ([place])   . Diabetes mellitus without complication ([place])   . Dyspnea   . Elevated troponin 04/18/2019  . Epigastric pain   . Gout 11/14/2017  . Hypercholesteremia   . Hyperlipidemia LDL goal <70 09/30/2016  . Hypertension   . Myocardial infarction ([place])   . Normochromic normocytic anemia 04/23/2016  . NSTEMI (non-ST elevated myocardial infarction) ([place]) 02/09/2019  . Pneumonia 11/01/2018  . Pneumonia due to COVID-19 virus 07/20/2018  . S/P thoracentesis   . SIRS (systemic inflammatory response syndrome) ([place]) 07/25/2018  . Spinal stenosis   . Stress-induced cardiomyopathy 05/19/2016  . Syncope 04/23/2016  . Type 2 diabetes mellitus with diabetic neuropathy, unspecified ([place]) 05/06/2016  . Vertigo 05/06/2016  . Vitamin D deficiency 04/02/2018    Past Surgical History:  Procedure Laterality Date  . BIOPSY  01/27/2019   Procedure: BIOPSY;  Surgeon: Otis Brace, MD;  Location: [facility];  Service: Gastroenterology;;  . BLADDER SURGERY    . CARDIAC CATHETERIZATION N/A 04/25/2016   Procedure: Left Heart Cath and Coronary Angiography;  Surgeon: Lorretta Harp, MD;  Location: [facility];  Service: Cardiovascular;  Laterality: N/A;  .  CARDIAC CATHETERIZATION  02/11/2019  . CESAREAN SECTION    . CHOLECYSTECTOMY    . ESOPHAGOGASTRODUODENOSCOPY (EGD) WITH PROPOFOL N/A 01/27/2019   Procedure: ESOPHAGOGASTRODUODENOSCOPY (EGD) WITH PROPOFOL;  Surgeon: Otis Brace, MD;  Location: [facility];  Service: Gastroenterology;   Laterality: N/A;  . RIGHT HEART CATH N/A 09/30/2019   Procedure: RIGHT HEART CATH;  Surgeon: Jolaine Artist, MD;  Location: [facility];  Service: Cardiovascular;  Laterality: N/A;  . RIGHT/LEFT HEART CATH AND CORONARY ANGIOGRAPHY N/A 02/11/2019   Procedure: RIGHT/LEFT HEART CATH AND CORONARY ANGIOGRAPHY;  Surgeon: Jolaine Artist, MD;  Location: [facility];  Service: Cardiovascular;  Laterality: N/A;  . VIDEO BRONCHOSCOPY N/A 02/01/2019   Procedure: VIDEO BRONCHOSCOPY WITHOUT FLUORO;  Surgeon: Candee Furbish, MD;  Location: [facility];  Service: Endoscopy;  Laterality: N/A;    Family History  Problem Relation Age of Onset  . Diabetes Mellitus II Father   . Stroke Father   . Healthy Mother        She is 64 years old.     Allergies  Allergen Reactions  . Garlic Shortness Of Breath, Itching, Swelling and Other (See Comments)    "Raw garlic" = Hand itching and swelling  . Isosorbide Other (See Comments)    Sneezing and runny nose  . Latex Itching  . Morphine And Related Itching and Other (See Comments)    Headache   . Other Itching and Other (See Comments)    Reaction to newspaper ink- Headaches, also    Outpatient Medications Prior to Visit  Medication Sig Dispense Refill  . Accu-Chek FastClix Lancets MISC USE AS DIRECTED TO TEST BLOOD SUGAR THREE TIMES DAILY 102 each 12  . acetaminophen (TYLENOL) 500 MG tablet Take 500-1,000 mg by mouth every 6 (six) hours as needed for mild pain or headache.    . allopurinol (ZYLOPRIM) 100 MG tablet Take 2 tablets (200 mg total) by mouth at bedtime. 30 tablet 1  . aspirin EC 81 MG tablet Take 81 mg by mouth in the morning.     Marland Kitchen atorvastatin (LIPITOR) 80 MG tablet Take 80 mg by mouth at bedtime.     . Blood Glucose Monitoring Suppl (ACCU-CHEK GUIDE) w/Device KIT 1 each by Does not apply route 3 (three) times daily. 1 kit 0  . DULoxetine (CYMBALTA) 30 MG capsule Take 1 capsule (30 mg total) by mouth daily. (Patient  taking differently: Take 30 mg by mouth at bedtime. ) 30 capsule 3  . EASY COMFORT PEN NEEDLES 31G X 5 MM MISC USE FOUR TIMES PER DAY FOR INSULIN ADMINISTRATION (Patient taking differently: 4 (four) times daily. ) 200 each 12  . ergocalciferol (VITAMIN D2) 1.25 MG (50000 UT) capsule Take 50,000 Units by mouth every Sunday.     . gabapentin (NEURONTIN) 100 MG capsule TAKE 1 CAPSULE (100 MG TOTAL) BY MOUTH 2 (TWO) TIMES DAILY. 60 capsule 2  . glucose blood (ACCU-CHEK GUIDE) test strip USE AS DIRECTED TO TEST BLOOD SUGAR THREE TIMES DAILY 100 each 12  . hydrALAZINE (APRESOLINE) 25 MG tablet Take 0.5 tablets (12.5 mg total) by mouth 3 (three) times daily. 120 tablet 11  . insulin aspart (NOVOLOG) 100 UNIT/ML injection 0 to 12 units subcutaneously 3 times daily before meals as per sliding scale (Patient taking differently: Inject 0-12 Units into the skin See admin instructions. Inject 0-12 units into the skin three times a day before meals, per sliding scale) 30 mL 3  . Lancet Device MISC  Use as instructed 3 times daily 1 each 0  . lansoprazole (PREVACID) 15 MG capsule TAKE 1 CAPSULE (15 MG TOTAL) BY MOUTH DAILY. (Patient taking differently: Take 15 mg by mouth daily before breakfast. ) 30 capsule 2  . Misc. Devices MISC Portable oxygen concentrator.  Diagnosis-chronic respiratory failure. 1 each 0  . Misc. Devices MISC Rollaor with seat. Dx: Congestive Heart Failure 1 each 0  . Misc. Devices MISC Scale; Dx - CHF 1 each 0  . Misc. [facility] bed.  Diagnosis CHF.  Lifetime use.  Weight 165 lbs. 1 each 0  . Misc. Devices [place] Shower chair DX CHF 1 each 0  . Misc. Devices MISC Manual wheelchai with cushion, anti - tippers, brakes and lock.  Diagnosis congestive heart failure.  Weight 140 pounds. 1 each 0  . Multiple Vitamins-Minerals (MULTIVITAMIN WITH MINERALS) tablet Take 1 tablet by mouth daily with breakfast.     . OXYGEN Inhale 4 L/min into the lungs continuous.    . primidone (MYSOLINE) 50  MG tablet Take 1 tablet (50 mg total) by mouth in the morning and at bedtime. 180 tablet 1  . sodium chloride (OCEAN) 0.65 % SOLN nasal spray Place 1 spray into both nostrils as needed for congestion.     . SUPER B COMPLEX/C CAPS Take 1 capsule by mouth daily.     Marland Kitchen torsemide (DEMADEX) 20 MG tablet Take 2 tablets (40 mg total) by mouth 2 (two) times daily. 120 tablet 3  . insulin glargine (LANTUS) 100 UNIT/ML injection Inject 0.05 mLs (5 Units total) into the skin at bedtime. 10 mL 11  . polyethylene glycol (MIRALAX / GLYCOLAX) 17 g packet Take 17 g by mouth daily as needed for moderate constipation. (Patient not taking: Reported on 10/08/2019)     No facility-administered medications prior to visit.     ROS Review of Systems  Constitutional: Negative for activity change, appetite change and fatigue.  HENT: Negative for congestion, sinus pressure and sore throat.   Eyes: Negative for visual disturbance.  Respiratory: Positive for shortness of breath. Negative for cough, chest tightness and wheezing.   Cardiovascular: Positive for leg swelling. Negative for chest pain and palpitations.  Gastrointestinal: Negative for abdominal distention, abdominal pain and constipation.  Endocrine: Negative for polydipsia.  Genitourinary: Negative for dysuria and frequency.  Musculoskeletal: Negative for arthralgias and back pain.  Skin: Negative for rash.  Neurological: Negative for tremors, light-headedness and numbness.  Hematological: Does not bruise/bleed easily.  Psychiatric/Behavioral: Negative for agitation and behavioral problems.    Objective:  BP (!) 163/86   Pulse (!) 125   Wt 142 lb (64.4 kg)   SpO2 (!) 65%   BMI 27.73 kg/m   BP/Weight 10/24/2019 11/19/7114 08/08/9036  Systolic BP 333 832 919  Diastolic BP 86 80 62  Wt. (Lbs) 142 - 140  BMI 27.73 - 27.34      Physical Exam Constitutional:      Appearance: She is well-developed.  Neck:     Vascular: No JVD.  Cardiovascular:      Rate and Rhythm: Normal rate.     Heart sounds: Normal heart sounds. No murmur heard.   Pulmonary:     Effort: Pulmonary effort is normal.     Breath sounds: Normal breath sounds. No wheezing or rales.  Chest:     Chest wall: No tenderness.  Abdominal:     General: Bowel sounds are normal. There is no distension.     Palpations: Abdomen  is soft. There is no mass.     Tenderness: There is no abdominal tenderness.  Musculoskeletal:        General: Normal range of motion.     Right lower leg: Edema (1+) present.     Left lower leg: Edema (1+) present.  Neurological:     Mental Status: She is alert and oriented to person, place, and time.  Psychiatric:        Mood and Affect: Mood normal.     CMP Latest Ref Rng & Units 10/07/2019 10/06/2019 10/05/2019  Glucose 70 - 99 mg/dL 212(H) 105(H) 92  BUN 8 - 23 mg/dL 53(H) 53(H) 50(H)  Creatinine 0.44 - 1.00 mg/dL 2.25(H) 2.10(H) 2.33(H)  Sodium 135 - 145 mmol/L 133(L) 131(L) 132(L)  Potassium 3.5 - 5.1 mmol/L 4.5 4.5 4.1  Chloride 98 - 111 mmol/L 96(L) 92(L) 93(L)  CO2 22 - 32 mmol/L _0 Calcium 8.9 - 10.3 mg/dL 9.3 9.3 9.4  Total Protein 6.5 - 8.1 g/dL - - -  Total Bilirubin 0.3 - 1.2 mg/dL - - -  Alkaline Phos 38 - 126 U/L - - -  AST 15 - 41 U/L - - -  ALT 0 - 44 U/L - - -    Lipid Panel     Component Value Date/Time   CHOL 353 (H) 01/09/2019 0959   TRIG 108 02/01/2019 1717   HDL 52 01/09/2019 0959   CHOLHDL 6.8 (H) 01/09/2019 0959   CHOLHDL 6.6 03/24/2018 0551   VLDL 35 03/24/2018 0551   LDLCALC 251 (H) 01/09/2019 0959    CBC    Component Value Date/Time   WBC 5.4 10/05/2019 1459   RBC 3.44 (L) 10/05/2019 1459   HGB 9.5 (L) 10/05/2019 1459   HGB 11.0 (L) 09/17/2018 1456   HCT 30.9 (L) 10/05/2019 1459   HCT 32.5 (L) 09/17/2018 1456   PLT 301 10/05/2019 1459   PLT 313 09/17/2018 1456   MCV 89.8 10/05/2019 1459   MCV 85 09/17/2018 1456   MCH 27.6 10/05/2019 1459   MCHC 30.7 10/05/2019 1459   RDW 16.6 (H)  10/05/2019 1459   RDW 14.4 09/17/2018 1456   LYMPHSABS 1.1 09/26/2019 1427   LYMPHSABS 0.9 09/17/2018 1456   MONOABS 0.6 09/26/2019 1427   EOSABS 0.1 09/26/2019 1427   EOSABS 0.1 09/17/2018 1456   BASOSABS 0.0 09/26/2019 1427   BASOSABS 0.0 09/17/2018 1456    Lab Results  Component Value Date   HGBA1C 7.1 (H) 09/26/2019    Assessment & Plan:   1. Type 2 diabetes mellitus with stage 3b chronic kidney disease, with long-term current use of insulin (HCC) Controlled with A1c of 7.1 We will be cautious with glycemic management due to the risk of hypoglycemia given her multiple comorbidities Granddaughter is at home most of the time with her and will need to be encouraged to assist patient with medication adherence which is a major issue Use NovoLog only with meals We will check renal function given she is on chronic diuretics - Insulin Glargine (BASAGLAR KWIKPEN) 100 UNIT/ML; Inject 0.05 mLs (5 Units total) into the skin daily.  Dispense: 30 mL; Refill: 6 - CMP14+EGFR  2. Chronic respiratory failure with hypoxia (HCC) Currently hypoxic at 65% of room air She is 4 L of oxygen at rest and which she does well  3. Hypertensive heart disease with chronic systolic congestive heart failure (HCC) No evidence of fluid overload We will check BMP Continue torsemide Follow-up with CHF  clinic - Brain natriuretic peptide  4. Type 2 diabetes mellitus with diabetic neuropathy, with long-term current use of insulin (HCC) For neuropathy she remains on gabapentin - insulin aspart (NOVOLOG) 100 UNIT/ML injection; 0 to 12 units subcutaneously 3 times daily before meals as per sliding scale  Dispense: 30 mL; Refill: 3  5. Other insomnia Due to her multiple comorbidities and multiple medications I am reluctant in placing her on a sedative as she is already on gabapentin which is sedating We have discussed natural supplements including melatonin, sleep hygiene, chamomile tea  6. Other headache  syndrome Secondary to insomnia at night with resulting headaches during the day Hopefully sleep hygiene will be beneficial   [name], MD, FAAFP. [facility] and [facility], [place]   10/24/2019, 11:52 AM

## 2019-10-24 NOTE — Patient Instructions (Signed)
Heart Failure, Self Care Heart failure is a serious condition. This sheet explains things you need to do to take care of yourself at home. To help you stay as healthy as possible, you may be asked to change your diet, take certain medicines, and make other changes in your life. Your doctor may also give you more specific instructions. If you have problems or questions, call your doctor. What are the risks? Having heart failure makes it more likely for you to have some problems. These problems can get worse if you do not take good care of yourself. Problems may include:  Blood clotting problems. This may cause a stroke.  Damage to the kidneys, liver, or lungs.  Abnormal heart rhythms. Supplies needed:  Scale for weighing yourself.  Blood pressure monitor.  Notebook.  Medicines. How to care for yourself when you have heart failure Medicines Take over-the-counter and prescription medicines only as told by your doctor. Take your medicines every day.  Do not stop taking your medicine unless your doctor tells you to do so.  Do not skip any medicines.  Get your prescriptions refilled before you run out of medicine. This is important. Eating and drinking   Eat heart-healthy foods. Talk with a diet specialist (dietitian) to create an eating plan.  Choose foods that: ? Have no trans fat. ? Are low in saturated fat and cholesterol.  Choose healthy foods, such as: ? Fresh or frozen fruits and vegetables. ? Fish. ? Low-fat (lean) meats. ? Legumes, such as beans, peas, and lentils. ? Fat-free or low-fat dairy products. ? Whole-grain foods. ? High-fiber foods.  Limit salt (sodium) if told by your doctor. Ask your diet specialist to tell you which seasonings are healthy for your heart.  Cook in healthy ways instead of frying. Healthy ways of cooking include roasting, grilling, broiling, baking, poaching, steaming, and stir-frying.  Limit how much fluid you drink, if told by your  doctor. Alcohol use  Do not drink alcohol if: ? Your doctor tells you not to drink. ? Your heart was damaged by alcohol, or you have very bad heart failure. ? You are pregnant, may be pregnant, or are planning to become pregnant.  If you drink alcohol: ? Limit how much you use to:  0-1 drink a day for women.  0-2 drinks a day for men. ? Be aware of how much alcohol is in your drink. In the [place] one drink equals one 12 oz bottle of beer (355 mL), one 5 oz glass of wine (148 mL), or one 1 oz glass of hard liquor (44 mL). Lifestyle   Do not use any products that contain nicotine or tobacco, such as cigarettes, e-cigarettes, and chewing tobacco. If you need help quitting, ask your doctor. ? Do not use nicotine gum or patches before talking to your doctor.  Do not use illegal drugs.  Lose weight if told by your doctor.  Do physical activity if told by your doctor. Talk to your doctor before you begin an exercise if: ? You are an older adult. ? You have very bad heart failure.  Learn to manage stress. If you need help, ask your doctor.  Get rehab (rehabilitation) to help you stay independent and to help with your quality of life.  Plan time to rest when you get tired. Check weight and blood pressure   Weigh yourself every day. This will help you to know if fluid is building up in your body. ? Weigh yourself every morning   after you pee (urinate) and before you eat breakfast. ? Wear the same amount of clothing each time. ? Write down your daily weight. Give your record to your doctor.  Check and write down your blood pressure as told by your doctor.  Check your pulse as told by your doctor. Dealing with very hot and very cold weather  If it is very hot: ? Avoid activities that take a lot of energy. ? Use air conditioning or fans, or find a cooler place. ? Avoid caffeine and alcohol. ? Wear clothing that is loose-fitting, lightweight, and light-colored.  If it is very  cold: ? Avoid activities that take a lot of energy. ? Layer your clothes. ? Wear mittens or gloves, a hat, and a scarf when you go outside. ? Avoid alcohol. Follow these instructions at home:  Stay up to date with shots (vaccines). Get pneumococcal and flu (influenza) shots.  Keep all follow-up visits as told by your doctor. This is important. Contact a doctor if:  You gain weight quickly.  You have increasing shortness of breath.  You cannot do your normal activities.  You get tired easily.  You cough a lot.  You don't feel like eating or feel like you may vomit (nauseous).  You become puffy (swell) in your hands, feet, ankles, or belly (abdomen).  You cannot sleep well because it is hard to breathe.  You feel like your heart is beating fast (palpitations).  You get dizzy when you stand up. Get help right away if:  You have trouble breathing.  You or someone else notices a change in your behavior, such as having trouble staying awake.  You have chest pain or discomfort.  You pass out (faint). These symptoms may be an emergency. Do not wait to see if the symptoms will go away. Get medical help right away. Call your local emergency services (911 in the [place] Do not drive yourself to the hospital. Summary  Heart failure is a serious condition. To care for yourself, you may have to change your diet, take medicines, and make other lifestyle changes.  Take your medicines every day. Do not stop taking them unless your doctor tells you to do so.  Eat heart-healthy foods, such as fresh or frozen fruits and vegetables, fish, lean meats, legumes, fat-free or low-fat dairy products, and whole-grain or high-fiber foods.  Ask your doctor if you can drink alcohol. You may have to stop alcohol use if you have very bad heart failure.  Contact your doctor if you gain weight quickly or feel that your heart is beating too fast. Get help right away if you pass out, or have chest pain  or trouble breathing. This information is not intended to replace advice given to you by your health care provider. Make sure you discuss any questions you have with your health care provider. Document Revised: 07/03/2018 Document Reviewed: 07/04/2018 Elsevier Patient Education  2020 Elsevier Inc.  

## 2019-10-24 NOTE — Telephone Encounter (Signed)
Copied from [place] [id number]. Topic: General - Other >> Oct 24, 2019  3:15 PM Yvette Rack wrote: Reason for CRM: Anderson Malta with [employer] stated they received orders from the office and they need office notes faxed to [phone]

## 2019-10-25 ENCOUNTER — Encounter: Payer: Self-pay | Admitting: Family Medicine

## 2019-10-25 LAB — BRAIN NATRIURETIC PEPTIDE: BNP: 728.3 pg/mL — ABNORMAL HIGH (ref 0.0–100.0)

## 2019-10-25 LAB — CMP14+EGFR
ALT: 7 IU/L (ref 0–32)
AST: 11 IU/L (ref 0–40)
Albumin/Globulin Ratio: 1.4 (ref 1.2–2.2)
Albumin: 3.6 g/dL — ABNORMAL LOW (ref 3.8–4.8)
Alkaline Phosphatase: 86 IU/L (ref 48–121)
BUN/Creatinine Ratio: 27 (ref 12–28)
BUN: 50 mg/dL — ABNORMAL HIGH (ref 8–27)
Bilirubin Total: <0.2 mg/dL (ref 0.0–1.2)
CO2: 25 mmol/L (ref 20–29)
Calcium: 10.1 mg/dL (ref 8.7–10.3)
Chloride: 101 mmol/L (ref 96–106)
Creatinine, Ser: 1.84 mg/dL — ABNORMAL HIGH (ref 0.57–1.00)
GFR calc Af Amer: 33 mL/min/{1.73_m2} — ABNORMAL LOW (ref >59)
GFR calc non Af Amer: 29 mL/min/{1.73_m2} — ABNORMAL LOW (ref >59)
Globulin, Total: 2.6 g/dL (ref 1.5–4.5)
Glucose: 185 mg/dL — ABNORMAL HIGH (ref 65–99)
Potassium: 4.6 mmol/L (ref 3.5–5.2)
Sodium: 139 mmol/L (ref 134–144)
Total Protein: 6.2 g/dL (ref 6.0–8.5)

## 2019-10-25 MED ORDER — DEXCOM G6 SENSOR MISC: 3 refills | Status: DC

## 2019-10-25 MED ORDER — DEXCOM G6 TRANSMITTER MISC: 3 refills | Status: DC

## 2019-10-25 MED ORDER — DEXCOM G6 RECEIVER DEVI: 0 refills | Status: DC

## 2019-10-28 ENCOUNTER — Telehealth: Payer: Self-pay

## 2019-10-28 NOTE — Telephone Encounter (Signed)
Call placed to patient, spoke to daughter, [name], and informed her that per Dr Margarita Rana:  Labs reveal abnormal kidney function which has improved compared to previous. BNP is elevated which could be a marker of fluid overload. If she is having worsening shortness of breath she needs to get in touch with her Cardiologist.  [name] said she will call the cardiologist to inquire if her mother can be seen before 12/03/2019.

## 2019-10-30 ENCOUNTER — Telehealth: Payer: Self-pay

## 2019-10-30 ENCOUNTER — Other Ambulatory Visit (HOSPITAL_COMMUNITY): Payer: Self-pay

## 2019-10-30 NOTE — Progress Notes (Signed)
Paramedicine Encounter    Patient ID: Kara Hanson, female    DOB: 10-Mar-1956, 64 y.o.   MRN: [id number]   Patient Care Team: [name], MD as PCP - General (Family Medicine) Jerline Pain, MD as PCP - Cardiology (Cardiology)  Patient Active Problem List   Diagnosis Date Noted  . Palliative care by specialist   . Goals of care, counseling/discussion   . Cardiorenal syndrome   . Hyponatremia 09/26/2019  . CHF exacerbation ([place]) 09/26/2019  . Acute on chronic systolic CHF (congestive heart failure) ([place]) 05/19/2019  . Acute exacerbation of CHF (congestive heart failure) ([place]) 04/18/2019  . Elevated troponin 04/18/2019  . Acute respiratory failure with hypoxia ([place]) 02/09/2019  . NSTEMI (non-ST elevated myocardial infarction) ([place]) 02/09/2019  . S/P thoracentesis   . Atelectasis   . Chronic respiratory failure ([place])   . Acute renal failure superimposed on stage 3 chronic kidney disease ([place])   . Epigastric pain   . CHF (congestive heart failure) ([place]) 01/23/2019  . CAP (community acquired pneumonia) 10/31/2018  . Acute CHF (congestive heart failure) ([place]) 09/24/2018  . SIRS (systemic inflammatory response syndrome) ([place]) 07/25/2018  . COVID-19 virus infection 07/20/2018  . Chronic diastolic CHF (congestive heart failure) ([place]) 07/20/2018  . Pneumonia due to COVID-19 virus 07/20/2018  . Vitamin D deficiency 04/02/2018  . Spinal stenosis 03/29/2018  . Acute on chronic respiratory failure with hypoxia ([place]) 03/20/2018  . Controlled type 2 diabetes with neuropathy ([place]) 03/20/2018  . CKD (chronic kidney disease), stage III 03/20/2018  . Acute on chronic combined systolic and diastolic CHF (congestive heart failure) ([place]) 11/27/2017  . AKI (acute kidney injury) ([place]) 11/27/2017  . Acute respiratory distress 11/27/2017  . Bulging lumbar disc 11/14/2017  . Gout 11/14/2017  . Hyperlipidemia LDL goal <70 09/30/2016  . Chest pain 06/17/2016  . Stress-induced cardiomyopathy  05/19/2016  . Type 2 diabetes mellitus with diabetic neuropathy, unspecified ([place]) 05/06/2016  . Vertigo 05/06/2016  . Controlled type 2 diabetes mellitus with hyperglycemia ([place]) 04/23/2016  . Hypertension 04/23/2016  . Normochromic normocytic anemia 04/23/2016  . Syncope 04/23/2016    Current Outpatient Medications:  .  Accu-Chek FastClix Lancets MISC, USE AS DIRECTED TO TEST BLOOD SUGAR THREE TIMES DAILY, Disp: 102 each, Rfl: 12 .  acetaminophen (TYLENOL) 500 MG tablet, Take 500-1,000 mg by mouth every 6 (six) hours as needed for mild pain or headache., Disp: , Rfl:  .  allopurinol (ZYLOPRIM) 100 MG tablet, Take 2 tablets (200 mg total) by mouth at bedtime., Disp: 30 tablet, Rfl: 1 .  aspirin EC 81 MG tablet, Take 81 mg by mouth in the morning. , Disp: , Rfl:  .  atorvastatin (LIPITOR) 80 MG tablet, Take 80 mg by mouth at bedtime. , Disp: , Rfl:  .  Blood Glucose Monitoring Suppl (ACCU-CHEK GUIDE) w/Device KIT, 1 each by Does not apply route 3 (three) times daily., Disp: 1 kit, Rfl: 0 .  Continuous Blood Gluc Receiver (DEXCOM G6 RECEIVER) DEVI, Use 1 each as directed, Disp: 1 each, Rfl: 0 .  Continuous Blood Gluc Sensor (DEXCOM G6 SENSOR) MISC, Use 1 each as directed, Disp: 1 each, Rfl: 3 .  Continuous Blood Gluc Transmit (DEXCOM G6 TRANSMITTER) MISC, Use 1 each as directed, Disp: 1 each, Rfl: 3 .  DULoxetine (CYMBALTA) 30 MG capsule, Take 1 capsule (30 mg total) by mouth daily. (Patient taking differently: Take 30 mg by mouth at bedtime. ), Disp: 30 capsule, Rfl: 3 .  EASY COMFORT PEN NEEDLES  31G X 5 MM MISC, USE FOUR TIMES PER DAY FOR INSULIN ADMINISTRATION (Patient taking differently: 4 (four) times daily. ), Disp: 200 each, Rfl: 12 .  ergocalciferol (VITAMIN D2) 1.25 MG (50000 UT) capsule, Take 50,000 Units by mouth every Sunday. , Disp: , Rfl:  .  gabapentin (NEURONTIN) 100 MG capsule, TAKE 1 CAPSULE (100 MG TOTAL) BY MOUTH 2 (TWO) TIMES DAILY., Disp: 60 capsule, Rfl: 2 .  glucose  blood (ACCU-CHEK GUIDE) test strip, USE AS DIRECTED TO TEST BLOOD SUGAR THREE TIMES DAILY, Disp: 100 each, Rfl: 12 .  hydrALAZINE (APRESOLINE) 25 MG tablet, Take 0.5 tablets (12.5 mg total) by mouth 3 (three) times daily., Disp: 120 tablet, Rfl: 11 .  insulin aspart (NOVOLOG) 100 UNIT/ML injection, 0 to 12 units subcutaneously 3 times daily before meals as per sliding scale, Disp: 30 mL, Rfl: 3 .  Insulin Glargine (BASAGLAR KWIKPEN) 100 UNIT/ML, Inject 0.05 mLs (5 Units total) into the skin daily., Disp: 30 mL, Rfl: 6 .  Lancet Device MISC, Use as instructed 3 times daily, Disp: 1 each, Rfl: 0 .  lansoprazole (PREVACID) 15 MG capsule, TAKE 1 CAPSULE (15 MG TOTAL) BY MOUTH DAILY. (Patient taking differently: Take 15 mg by mouth daily before breakfast. ), Disp: 30 capsule, Rfl: 2 .  Misc. Devices MISC, Portable oxygen concentrator.  Diagnosis-chronic respiratory failure., Disp: 1 each, Rfl: 0 .  Misc. Devices MISC, [place] with seat. Dx: Congestive Heart Failure, Disp: 1 each, Rfl: 0 .  Misc. Devices MISC, Scale; Dx - CHF, Disp: 1 each, Rfl: 0 .  Misc. Devices [facility] bed.  Diagnosis CHF.  Lifetime use.  Weight 165 lbs., Disp: 1 each, Rfl: 0 .  Misc. Devices MISC, Shower chair DX CHF, Disp: 1 each, Rfl: 0 .  Misc. Devices MISC, Manual wheelchai with cushion, anti - tippers, brakes and lock.  Diagnosis congestive heart failure.  Weight 140 pounds., Disp: 1 each, Rfl: 0 .  Multiple Vitamins-Minerals (MULTIVITAMIN WITH MINERALS) tablet, Take 1 tablet by mouth daily with breakfast. , Disp: , Rfl:  .  OXYGEN, Inhale 4 L/min into the lungs continuous., Disp: , Rfl:  .  polyethylene glycol (MIRALAX / GLYCOLAX) 17 g packet, Take 17 g by mouth daily as needed for moderate constipation. (Patient not taking: Reported on 10/08/2019), Disp: , Rfl:  .  primidone (MYSOLINE) 50 MG tablet, Take 1 tablet (50 mg total) by mouth in the morning and at bedtime., Disp: 180 tablet, Rfl: 1 .  sodium chloride (OCEAN) 0.65  % SOLN nasal spray, Place 1 spray into both nostrils as needed for congestion. , Disp: , Rfl:  .  SUPER B COMPLEX/C CAPS, Take 1 capsule by mouth daily. , Disp: , Rfl:  .  torsemide (DEMADEX) 20 MG tablet, Take 2 tablets (40 mg total) by mouth 2 (two) times daily., Disp: 120 tablet, Rfl: 3 Allergies  Allergen Reactions  . Garlic Shortness Of Breath, Itching, Swelling and Other (See Comments)    "Raw garlic" = Hand itching and swelling  . Isosorbide Other (See Comments)    Sneezing and runny nose  . Latex Itching  . Morphine And Related Itching and Other (See Comments)    Headache   . Other Itching and Other (See Comments)    Reaction to newspaper ink- Headaches, also     Social History   Socioeconomic History  . Marital status: Widowed    Spouse name: Not on file  . Number of children: Not on file  . Years  of education: Not on file  . Highest education level: Not on file  Occupational History  . Not on file  Tobacco Use  . Smoking status: Never Smoker  . Smokeless tobacco: Never Used  Vaping Use  . Vaping Use: Never used  Substance and Sexual Activity  . Alcohol use: No  . Drug use: No  . Sexual activity: Not Currently    Birth control/protection: None  Other Topics Concern  . Not on file  Social History Narrative  . Not on file   Social Determinants of Health   Financial Resource Strain:   . Difficulty of Paying Living Expenses:   Food Insecurity:   . Worried About Charity fundraiser in the Last Year:   . Arboriculturist in the Last Year:   Transportation Needs:   . Film/video editor (Medical):   Marland Kitchen Lack of Transportation (Non-Medical):   Physical Activity:   . Days of Exercise per Week:   . Minutes of Exercise per Session:   Stress:   . Feeling of Stress :   Social Connections:   . Frequency of Communication with Friends and Family:   . Frequency of Social Gatherings with Friends and Family:   . Attends Religious Services:   . Active Member of  Clubs or Organizations:   . Attends Archivist Meetings:   Marland Kitchen Marital Status:   Intimate Partner Violence:   . Fear of Current or Ex-Partner:   . Emotionally Abused:   Marland Kitchen Physically Abused:   . Sexually Abused:     Physical Exam Vitals reviewed.  Constitutional:      Appearance: She is normal weight.  HENT:     Head: Normocephalic.     Nose: Nose normal.     Mouth/Throat:     Mouth: Mucous membranes are moist.     Pharynx: Oropharynx is clear.  Eyes:     Pupils: Pupils are equal, round, and reactive to light.  Cardiovascular:     Rate and Rhythm: Normal rate and regular rhythm.     Pulses: Normal pulses.     Heart sounds: Normal heart sounds.  Pulmonary:     Effort: Pulmonary effort is normal.     Breath sounds: Normal breath sounds.  Abdominal:     General: Abdomen is flat.     Palpations: Abdomen is soft.  Musculoskeletal:        General: Tenderness present. Normal range of motion.     Cervical back: Normal range of motion.     Right lower leg: Edema present.     Left lower leg: Edema present.     Comments: Right shoulder pain, Bilateral knee pain.  Skin:    General: Skin is warm and dry.     Capillary Refill: Capillary refill takes less than 2 seconds.  Neurological:     Mental Status: She is alert. Mental status is at baseline.  Psychiatric:        Mood and Affect: Mood normal.      Arrived for home visit for Laritza who was alert and oriented laying in bed reporting she fell in the kitchen last night after tripping over her oxygen tubing. Reata denied loss of consciousness. Exam revealed some bilateral knee pain and swelling, left shoulder pain and a headache. Adjoa stated she fell and struck her head on the counter while going down ultimately landing on her left side. Anastasha's granddaughter called EMS and EMS came out and evaluated her and  assisted her back onto her feet and into her bed. Graycie was not using walker as instructed. I instructed Tereza to  use her walker when walking around. She agreed. Vitals obtained and are as noted. Jocelynne has her granddaughter there during the day but she has not been getting her evening or bedtime medication doses. I contacted patient's daughter and expressed my concern and she agreed and stated she would tell her daughter to assist with med administration. Patient and her daughter also request a CNA or aide to come out. I will be placing this request. Medications reviewed and confirmed. Pill box filled accordingly. Patient given her morning medications. Samika was also given Tylenol due to her pain from the fall. No obvious deformities were noted on assessment. Shontia agreed to visit in one week. Home visit complete.   Refills: NONE   Future Appointments  Date Time Provider [place]  12/03/2019 10:30 AM [facility] PA/NP [facility] None  12/04/2019  4:00 PM Jerline Pain, MD [facility]  02/03/2020  8:15 AM Tat, Eustace Quail, DO [facility] None     ACTION: Home visit completed Next visit planned for one week

## 2019-10-30 NOTE — Telephone Encounter (Signed)
PCS application faxed to [employer]

## 2019-11-01 ENCOUNTER — Telehealth: Payer: Self-pay | Admitting: Neurology

## 2019-11-01 NOTE — Telephone Encounter (Signed)
-----   Message from [name], EMT sent at 10/29/2019  5:44 PM EDT ----- Regarding: [facility] Hello Dr. Carles Collet I am community paramedic for Kara Hanson and I assist with her medications weekly as well as home visits. She has been taking the Primidone for her tremors and is not having much relief. Please advise any further. Thank you.

## 2019-11-01 NOTE — Telephone Encounter (Signed)
Message sent to [facility] paramedic that we may need to increase primidone but would prefer to wait until Dr Tat sees her back in f/u if she is able.

## 2019-11-01 NOTE — Telephone Encounter (Signed)
Let them know that we may need to increase primidone but would prefer to wait until I see her back in f/u if she is able.

## 2019-11-06 ENCOUNTER — Other Ambulatory Visit (HOSPITAL_COMMUNITY): Payer: Self-pay

## 2019-11-06 NOTE — Progress Notes (Signed)
Paramedicine Encounter    Patient ID: Kara Hanson, female    DOB: 10-Mar-1956, 64 y.o.   MRN: [id number]   Patient Care Team: [name], MD as PCP - General (Family Medicine) Jerline Pain, MD as PCP - Cardiology (Cardiology)  Patient Active Problem List   Diagnosis Date Noted  . Palliative care by specialist   . Goals of care, counseling/discussion   . Cardiorenal syndrome   . Hyponatremia 09/26/2019  . CHF exacerbation ([place]) 09/26/2019  . Acute on chronic systolic CHF (congestive heart failure) ([place]) 05/19/2019  . Acute exacerbation of CHF (congestive heart failure) ([place]) 04/18/2019  . Elevated troponin 04/18/2019  . Acute respiratory failure with hypoxia ([place]) 02/09/2019  . NSTEMI (non-ST elevated myocardial infarction) ([place]) 02/09/2019  . S/P thoracentesis   . Atelectasis   . Chronic respiratory failure ([place])   . Acute renal failure superimposed on stage 3 chronic kidney disease ([place])   . Epigastric pain   . CHF (congestive heart failure) ([place]) 01/23/2019  . CAP (community acquired pneumonia) 10/31/2018  . Acute CHF (congestive heart failure) ([place]) 09/24/2018  . SIRS (systemic inflammatory response syndrome) ([place]) 07/25/2018  . COVID-19 virus infection 07/20/2018  . Chronic diastolic CHF (congestive heart failure) ([place]) 07/20/2018  . Pneumonia due to COVID-19 virus 07/20/2018  . Vitamin D deficiency 04/02/2018  . Spinal stenosis 03/29/2018  . Acute on chronic respiratory failure with hypoxia ([place]) 03/20/2018  . Controlled type 2 diabetes with neuropathy ([place]) 03/20/2018  . CKD (chronic kidney disease), stage III 03/20/2018  . Acute on chronic combined systolic and diastolic CHF (congestive heart failure) ([place]) 11/27/2017  . AKI (acute kidney injury) ([place]) 11/27/2017  . Acute respiratory distress 11/27/2017  . Bulging lumbar disc 11/14/2017  . Gout 11/14/2017  . Hyperlipidemia LDL goal <70 09/30/2016  . Chest pain 06/17/2016  . Stress-induced cardiomyopathy  05/19/2016  . Type 2 diabetes mellitus with diabetic neuropathy, unspecified ([place]) 05/06/2016  . Vertigo 05/06/2016  . Controlled type 2 diabetes mellitus with hyperglycemia ([place]) 04/23/2016  . Hypertension 04/23/2016  . Normochromic normocytic anemia 04/23/2016  . Syncope 04/23/2016    Current Outpatient Medications:  .  Accu-Chek FastClix Lancets MISC, USE AS DIRECTED TO TEST BLOOD SUGAR THREE TIMES DAILY, Disp: 102 each, Rfl: 12 .  acetaminophen (TYLENOL) 500 MG tablet, Take 500-1,000 mg by mouth every 6 (six) hours as needed for mild pain or headache., Disp: , Rfl:  .  allopurinol (ZYLOPRIM) 100 MG tablet, Take 2 tablets (200 mg total) by mouth at bedtime., Disp: 30 tablet, Rfl: 1 .  aspirin EC 81 MG tablet, Take 81 mg by mouth in the morning. , Disp: , Rfl:  .  atorvastatin (LIPITOR) 80 MG tablet, Take 80 mg by mouth at bedtime. , Disp: , Rfl:  .  Blood Glucose Monitoring Suppl (ACCU-CHEK GUIDE) w/Device KIT, 1 each by Does not apply route 3 (three) times daily., Disp: 1 kit, Rfl: 0 .  Continuous Blood Gluc Receiver (DEXCOM G6 RECEIVER) DEVI, Use 1 each as directed, Disp: 1 each, Rfl: 0 .  Continuous Blood Gluc Sensor (DEXCOM G6 SENSOR) MISC, Use 1 each as directed, Disp: 1 each, Rfl: 3 .  Continuous Blood Gluc Transmit (DEXCOM G6 TRANSMITTER) MISC, Use 1 each as directed, Disp: 1 each, Rfl: 3 .  DULoxetine (CYMBALTA) 30 MG capsule, Take 1 capsule (30 mg total) by mouth daily. (Patient taking differently: Take 30 mg by mouth at bedtime. ), Disp: 30 capsule, Rfl: 3 .  EASY COMFORT PEN NEEDLES  31G X 5 MM MISC, USE FOUR TIMES PER DAY FOR INSULIN ADMINISTRATION (Patient taking differently: 4 (four) times daily. ), Disp: 200 each, Rfl: 12 .  ergocalciferol (VITAMIN D2) 1.25 MG (50000 UT) capsule, Take 50,000 Units by mouth every Sunday. , Disp: , Rfl:  .  gabapentin (NEURONTIN) 100 MG capsule, TAKE 1 CAPSULE (100 MG TOTAL) BY MOUTH 2 (TWO) TIMES DAILY., Disp: 60 capsule, Rfl: 2 .  glucose  blood (ACCU-CHEK GUIDE) test strip, USE AS DIRECTED TO TEST BLOOD SUGAR THREE TIMES DAILY, Disp: 100 each, Rfl: 12 .  hydrALAZINE (APRESOLINE) 25 MG tablet, Take 0.5 tablets (12.5 mg total) by mouth 3 (three) times daily., Disp: 120 tablet, Rfl: 11 .  insulin aspart (NOVOLOG) 100 UNIT/ML injection, 0 to 12 units subcutaneously 3 times daily before meals as per sliding scale, Disp: 30 mL, Rfl: 3 .  Insulin Glargine (BASAGLAR KWIKPEN) 100 UNIT/ML, Inject 0.05 mLs (5 Units total) into the skin daily., Disp: 30 mL, Rfl: 6 .  Lancet Device MISC, Use as instructed 3 times daily, Disp: 1 each, Rfl: 0 .  lansoprazole (PREVACID) 15 MG capsule, TAKE 1 CAPSULE (15 MG TOTAL) BY MOUTH DAILY. (Patient taking differently: Take 15 mg by mouth daily before breakfast. ), Disp: 30 capsule, Rfl: 2 .  Misc. Devices MISC, Portable oxygen concentrator.  Diagnosis-chronic respiratory failure., Disp: 1 each, Rfl: 0 .  Misc. Devices MISC, Rollaor with seat. Dx: Congestive Heart Failure (Patient not taking: Reported on 10/30/2019), Disp: 1 each, Rfl: 0 .  Misc. Devices MISC, Scale; Dx - CHF, Disp: 1 each, Rfl: 0 .  Misc. Devices [facility] bed.  Diagnosis CHF.  Lifetime use.  Weight 165 lbs., Disp: 1 each, Rfl: 0 .  Misc. Devices MISC, Shower chair DX CHF, Disp: 1 each, Rfl: 0 .  Misc. Devices MISC, Manual wheelchai with cushion, anti - tippers, brakes and lock.  Diagnosis congestive heart failure.  Weight 140 pounds., Disp: 1 each, Rfl: 0 .  Multiple Vitamins-Minerals (MULTIVITAMIN WITH MINERALS) tablet, Take 1 tablet by mouth daily with breakfast. , Disp: , Rfl:  .  OXYGEN, Inhale 4 L/min into the lungs continuous., Disp: , Rfl:  .  polyethylene glycol (MIRALAX / GLYCOLAX) 17 g packet, Take 17 g by mouth daily as needed for moderate constipation. (Patient not taking: Reported on 10/08/2019), Disp: , Rfl:  .  primidone (MYSOLINE) 50 MG tablet, Take 1 tablet (50 mg total) by mouth in the morning and at bedtime., Disp: 180  tablet, Rfl: 1 .  sodium chloride (OCEAN) 0.65 % SOLN nasal spray, Place 1 spray into both nostrils as needed for congestion. , Disp: , Rfl:  .  SUPER B COMPLEX/C CAPS, Take 1 capsule by mouth daily. , Disp: , Rfl:  .  torsemide (DEMADEX) 20 MG tablet, Take 2 tablets (40 mg total) by mouth 2 (two) times daily., Disp: 120 tablet, Rfl: 3 Allergies  Allergen Reactions  . Garlic Shortness Of Breath, Itching, Swelling and Other (See Comments)    "Raw garlic" = Hand itching and swelling  . Isosorbide Other (See Comments)    Sneezing and runny nose  . Latex Itching  . Morphine And Related Itching and Other (See Comments)    Headache   . Other Itching and Other (See Comments)    Reaction to newspaper ink- Headaches, also     Social History   Socioeconomic History  . Marital status: Widowed    Spouse name: Not on file  . Number of children:  Not on file  . Years of education: Not on file  . Highest education level: Not on file  Occupational History  . Not on file  Tobacco Use  . Smoking status: Never Smoker  . Smokeless tobacco: Never Used  Vaping Use  . Vaping Use: Never used  Substance and Sexual Activity  . Alcohol use: No  . Drug use: No  . Sexual activity: Not Currently    Birth control/protection: None  Other Topics Concern  . Not on file  Social History Narrative  . Not on file   Social Determinants of Health   Financial Resource Strain:   . Difficulty of Paying Living Expenses:   Food Insecurity:   . Worried About Charity fundraiser in the Last Year:   . Arboriculturist in the Last Year:   Transportation Needs:   . Film/video editor (Medical):   Marland Kitchen Lack of Transportation (Non-Medical):   Physical Activity:   . Days of Exercise per Week:   . Minutes of Exercise per Session:   Stress:   . Feeling of Stress :   Social Connections:   . Frequency of Communication with Friends and Family:   . Frequency of Social Gatherings with Friends and Family:   .  Attends Religious Services:   . Active Member of Clubs or Organizations:   . Attends Archivist Meetings:   Marland Kitchen Marital Status:   Intimate Partner Violence:   . Fear of Current or Ex-Partner:   . Emotionally Abused:   Marland Kitchen Physically Abused:   . Sexually Abused:     Physical Exam Vitals reviewed.  Constitutional:      Appearance: She is normal weight.  HENT:     Head: Normocephalic.     Nose: Nose normal.     Mouth/Throat:     Mouth: Mucous membranes are moist.     Pharynx: Oropharynx is clear.  Eyes:     Pupils: Pupils are equal, round, and reactive to light.  Cardiovascular:     Rate and Rhythm: Normal rate and regular rhythm.     Pulses: Normal pulses.     Heart sounds: Normal heart sounds.  Pulmonary:     Effort: Pulmonary effort is normal. No respiratory distress.     Breath sounds: Normal breath sounds.  Abdominal:     General: Abdomen is flat.     Palpations: Abdomen is soft.  Musculoskeletal:        General: Normal range of motion.     Cervical back: Normal range of motion.     Right lower leg: No edema.     Left lower leg: No edema.  Skin:    General: Skin is warm and dry.     Capillary Refill: Capillary refill takes less than 2 seconds.  Neurological:     General: No focal deficit present.     Mental Status: She is alert. Mental status is at baseline.  Psychiatric:        Mood and Affect: Mood normal.      Arrived for home visit for Yeila who was seated on the edge of her bed alert and oriented with no shortness of breath. Kaytelynn reports she is doing good and has no complaints. Dorean missed multiple doses of medications over the last week. I educated Neviah on importance of taking medications, she verbalized understanding. Camira's daughter [name] works a full time job causing her to not be home a few days out of the  week including nights and mornings. Yaileen's granddaughter Gae Bon is living with them but does not assist Charlise in delivering her  medications to her or reminding her to take them. Gae Bon is pregnant and will soon not be able to assist with [name] at all. Concerns for needing assistance for Jaden is present. PCS referral placed by Jane at [facility]. Will await response. Medications verified and confirmed. Pill box filled accordingly. Home visit complete.  I will see Jhanvi in one week.   Refills: NONE    Future Appointments  Date Time Provider [place]  12/03/2019 10:30 AM [facility] PA/NP [facility] None  12/04/2019  4:00 PM Jerline Pain, MD [facility]  02/03/2020  8:15 AM Tat, Eustace Quail, DO [facility] None     ACTION: Home visit completed Next visit planned for one week

## 2019-11-07 ENCOUNTER — Telehealth: Payer: Self-pay

## 2019-11-07 NOTE — Telephone Encounter (Signed)
Call placed to [facility] to check on status of referral for PCS.  Spoke to [place] who stated that the referral has been received and processed that they have not been able to reach the patient.  They have also reached out to the patient's daughter and have not heard back. The patient needs to call [facility] healthcare to schedule assessment # [phone].   Attempted to contact patient's daughter to share this information with her but her voicemail is full

## 2019-11-07 NOTE — Telephone Encounter (Signed)
Call returned to [place] message left with call back to this CM requested # [phone].  Call placed to patient's daughter, Kara Hanson regarding PCS. She said that she received the call from [facility] and they scheduled a phone assessment for 11/18/2019.  She said her mother is hesitant about having an aide but this CM strongly encouraged her to have her mother accept the services.  Kara Hanson was in the store at the time of this call.  Instructed her to call this CM back next week to schedule an in office appointment with Dr Margarita Rana because her mother needs to re-qualify for O2.

## 2019-11-07 NOTE — Telephone Encounter (Signed)
Please advise.   Copied from [place] [id number]. Topic: General - Other >> Nov 07, 2019  2:52 PM Oneta Rack wrote: Osvaldo Human name: Jana Half  Relation to pt: scheduler from [facility]  Call back number: [phone]    Reason for call:  As per caller Deboraha Sprang case manager requested Janeece Riggers to reach out to patient and schedule assesment for personal care appt.[facility] unable to reach patient and would like case Freight forwarder or nurse to contact patient [place] is ready to schedule

## 2019-11-13 ENCOUNTER — Other Ambulatory Visit (HOSPITAL_COMMUNITY): Payer: Self-pay

## 2019-11-13 NOTE — Progress Notes (Signed)
Paramedicine Encounter    Patient ID: Kara Hanson, female    DOB: 02-18-1956, 64 y.o.   MRN: [id number]   Patient Care Team: [name], MD as PCP - General (Family Medicine) Jerline Pain, MD as PCP - Cardiology (Cardiology)  Patient Active Problem List   Diagnosis Date Noted   Palliative care by specialist    Goals of care, counseling/discussion    Cardiorenal syndrome    Hyponatremia 09/26/2019   CHF exacerbation ([place]) 09/26/2019   Acute on chronic systolic CHF (congestive heart failure) ([place]) 05/19/2019   Acute exacerbation of CHF (congestive heart failure) ([place]) 04/18/2019   Elevated troponin 04/18/2019   Acute respiratory failure with hypoxia ([place]) 02/09/2019   NSTEMI (non-ST elevated myocardial infarction) ([place]) 02/09/2019   S/P thoracentesis    Atelectasis    Chronic respiratory failure ([place])    Acute renal failure superimposed on stage 3 chronic kidney disease ([place])    Epigastric pain    CHF (congestive heart failure) ([place]) 01/23/2019   CAP (community acquired pneumonia) 10/31/2018   Acute CHF (congestive heart failure) ([place]) 09/24/2018   SIRS (systemic inflammatory response syndrome) ([place]) 07/25/2018   COVID-19 virus infection 07/20/2018   Chronic diastolic CHF (congestive heart failure) ([place]) 07/20/2018   Pneumonia due to COVID-19 virus 07/20/2018   Vitamin D deficiency 04/02/2018   Spinal stenosis 03/29/2018   Acute on chronic respiratory failure with hypoxia ([place]) 03/20/2018   Controlled type 2 diabetes with neuropathy ([place]) 03/20/2018   CKD (chronic kidney disease), stage III 03/20/2018   Acute on chronic combined systolic and diastolic CHF (congestive heart failure) ([place]) 11/27/2017   AKI (acute kidney injury) ([place]) 11/27/2017   Acute respiratory distress 11/27/2017   Bulging lumbar disc 11/14/2017   Gout 11/14/2017   Hyperlipidemia LDL goal <70 09/30/2016   Chest pain 06/17/2016   Stress-induced cardiomyopathy  05/19/2016   Type 2 diabetes mellitus with diabetic neuropathy, unspecified ([place]) 05/06/2016   Vertigo 05/06/2016   Controlled type 2 diabetes mellitus with hyperglycemia ([place]) 04/23/2016   Hypertension 04/23/2016   Normochromic normocytic anemia 04/23/2016   Syncope 04/23/2016    Current Outpatient Medications:    Accu-Chek FastClix Lancets MISC, USE AS DIRECTED TO TEST BLOOD SUGAR THREE TIMES DAILY (Patient not taking: Reported on 11/06/2019), Disp: 102 each, Rfl: 12   acetaminophen (TYLENOL) 500 MG tablet, Take 500-1,000 mg by mouth every 6 (six) hours as needed for mild pain or headache., Disp: , Rfl:    allopurinol (ZYLOPRIM) 100 MG tablet, Take 2 tablets (200 mg total) by mouth at bedtime., Disp: 30 tablet, Rfl: 1   aspirin EC 81 MG tablet, Take 81 mg by mouth in the morning. , Disp: , Rfl:    atorvastatin (LIPITOR) 80 MG tablet, Take 80 mg by mouth at bedtime. , Disp: , Rfl:    Blood Glucose Monitoring Suppl (ACCU-CHEK GUIDE) w/Device KIT, 1 each by Does not apply route 3 (three) times daily. (Patient not taking: Reported on 11/06/2019), Disp: 1 kit, Rfl: 0   Continuous Blood Gluc Receiver ([place]) DEVI, Use 1 each as directed (Patient not taking: Reported on 11/06/2019), Disp: 1 each, Rfl: 0   Continuous Blood Gluc Sensor (DEXCOM G6 SENSOR) MISC, Use 1 each as directed (Patient not taking: Reported on 11/06/2019), Disp: 1 each, Rfl: 3   Continuous Blood Gluc Transmit (DEXCOM G6 TRANSMITTER) MISC, Use 1 each as directed (Patient not taking: Reported on 11/06/2019), Disp: 1 each, Rfl: 3   DULoxetine (CYMBALTA) 30 MG capsule, Take  1 capsule (30 mg total) by mouth daily. (Patient taking differently: Take 30 mg by mouth at bedtime. ), Disp: 30 capsule, Rfl: 3   EASY COMFORT PEN NEEDLES 31G X 5 MM MISC, USE FOUR TIMES PER DAY FOR INSULIN ADMINISTRATION (Patient not taking: Reported on 11/06/2019), Disp: 200 each, Rfl: 12   ergocalciferol (VITAMIN D2) 1.25 MG (50000 UT)  capsule, Take 50,000 Units by mouth every Sunday. , Disp: , Rfl:    gabapentin (NEURONTIN) 100 MG capsule, TAKE 1 CAPSULE (100 MG TOTAL) BY MOUTH 2 (TWO) TIMES DAILY., Disp: 60 capsule, Rfl: 2   glucose blood (ACCU-CHEK GUIDE) test strip, USE AS DIRECTED TO TEST BLOOD SUGAR THREE TIMES DAILY (Patient not taking: Reported on 11/06/2019), Disp: 100 each, Rfl: 12   hydrALAZINE (APRESOLINE) 25 MG tablet, Take 0.5 tablets (12.5 mg total) by mouth 3 (three) times daily., Disp: 120 tablet, Rfl: 11   insulin aspart (NOVOLOG) 100 UNIT/ML injection, 0 to 12 units subcutaneously 3 times daily before meals as per sliding scale (Patient not taking: Reported on 11/06/2019), Disp: 30 mL, Rfl: 3   Insulin Glargine (BASAGLAR KWIKPEN) 100 UNIT/ML, Inject 0.05 mLs (5 Units total) into the skin daily. (Patient not taking: Reported on 11/06/2019), Disp: 30 mL, Rfl: 6   Lancet Device MISC, Use as instructed 3 times daily (Patient not taking: Reported on 11/06/2019), Disp: 1 each, Rfl: 0   lansoprazole (PREVACID) 15 MG capsule, TAKE 1 CAPSULE (15 MG TOTAL) BY MOUTH DAILY. (Patient taking differently: Take 15 mg by mouth daily before breakfast. ), Disp: 30 capsule, Rfl: 2   Misc. Devices MISC, Portable oxygen concentrator.  Diagnosis-chronic respiratory failure., Disp: 1 each, Rfl: 0   Misc. Devices MISC, Rollaor with seat. Dx: Congestive Heart Failure, Disp: 1 each, Rfl: 0   Misc. Devices MISC, Scale; Dx - CHF, Disp: 1 each, Rfl: 0   Misc. Devices [facility] bed.  Diagnosis CHF.  Lifetime use.  Weight 165 lbs., Disp: 1 each, Rfl: 0   Misc. Devices MISC, Shower chair DX CHF, Disp: 1 each, Rfl: 0   Misc. Devices MISC, Manual wheelchai with cushion, anti - tippers, brakes and lock.  Diagnosis congestive heart failure.  Weight 140 pounds., Disp: 1 each, Rfl: 0   Multiple Vitamins-Minerals (MULTIVITAMIN WITH MINERALS) tablet, Take 1 tablet by mouth daily with breakfast. , Disp: , Rfl:    OXYGEN, Inhale 4 L/min into  the lungs continuous., Disp: , Rfl:    polyethylene glycol (MIRALAX / GLYCOLAX) 17 g packet, Take 17 g by mouth daily as needed for moderate constipation. (Patient not taking: Reported on 10/08/2019), Disp: , Rfl:    primidone (MYSOLINE) 50 MG tablet, Take 1 tablet (50 mg total) by mouth in the morning and at bedtime., Disp: 180 tablet, Rfl: 1   sodium chloride (OCEAN) 0.65 % SOLN nasal spray, Place 1 spray into both nostrils as needed for congestion. , Disp: , Rfl:    SUPER B COMPLEX/C CAPS, Take 1 capsule by mouth daily. , Disp: , Rfl:    torsemide (DEMADEX) 20 MG tablet, Take 2 tablets (40 mg total) by mouth 2 (two) times daily., Disp: 120 tablet, Rfl: 3 Allergies  Allergen Reactions   Garlic Shortness Of Breath, Itching, Swelling and Other (See Comments)    "Raw garlic" = Hand itching and swelling   Isosorbide Other (See Comments)    Sneezing and runny nose   Latex Itching   Morphine And Related Itching and Other (See Comments)    Headache  Other Itching and Other (See Comments)    Reaction to newspaper ink- Headaches, also     Social History   Socioeconomic History   Marital status: Widowed    Spouse name: Not on file   Number of children: Not on file   Years of education: Not on file   Highest education level: Not on file  Occupational History   Not on file  Tobacco Use   Smoking status: Never Smoker   Smokeless tobacco: Never Used  Vaping Use   Vaping Use: Never used  Substance and Sexual Activity   Alcohol use: No   Drug use: No   Sexual activity: Not Currently    Birth control/protection: None  Other Topics Concern   Not on file  Social History Narrative   Not on file   Social Determinants of Health   Financial Resource Strain:    Difficulty of Paying Living Expenses:   Food Insecurity:    Worried About Charity fundraiser in the Last Year:    Arboriculturist in the Last Year:   Transportation Needs:    Film/video editor  (Medical):    Lack of Transportation (Non-Medical):   Physical Activity:    Days of Exercise per Week:    Minutes of Exercise per Session:   Stress:    Feeling of Stress :   Social Connections:    Frequency of Communication with Friends and Family:    Frequency of Social Gatherings with Friends and Family:    Attends Religious Services:    Active Member of Clubs or Organizations:    Attends Archivist Meetings:    Marital Status:   Intimate Partner Violence:    Fear of Current or Ex-Partner:    Emotionally Abused:    Physically Abused:    Sexually Abused:     Physical Exam Vitals reviewed.  Constitutional:      Appearance: She is normal weight.  HENT:     Head: Normocephalic.     Nose: Congestion and rhinorrhea present.     Mouth/Throat:     Mouth: Mucous membranes are moist.     Pharynx: Oropharynx is clear.  Eyes:     Pupils: Pupils are equal, round, and reactive to light.  Cardiovascular:     Rate and Rhythm: Normal rate and regular rhythm.     Pulses: Normal pulses.     Heart sounds: Normal heart sounds.  Pulmonary:     Effort: Pulmonary effort is normal.  Abdominal:     General: Abdomen is flat.     Palpations: Abdomen is soft.  Musculoskeletal:        General: Normal range of motion.     Cervical back: Normal range of motion.     Right lower leg: No edema.     Left lower leg: No edema.  Skin:    General: Skin is warm and dry.     Capillary Refill: Capillary refill takes less than 2 seconds.  Neurological:     Mental Status: She is alert. Mental status is at baseline.  Psychiatric:        Mood and Affect: Mood normal.     Arrived for home visit for Bayleigh who was seated on the edge of her bed alert and oriented reporting some complaints of congestion which has been going on for about a week now. No fever, chills, body aches, nausea, diarrhea or sore throat. Leonette has been semi compliant with her medications  over the last week. I  verified and reviewed medications filling pill box accordingly. Vitals obtained. Lung sounds clear, no edema. Weight normal. [name] agreed to visit in one week. [name] given written instructions for upcoming appointments. Home visit complete.    CBG- 174  NO REFILLS    Future Appointments  Date Time Provider [place]  12/03/2019 10:30 AM [facility] PA/NP [facility] None  12/04/2019  4:00 PM Jerline Pain, MD [facility]  01/29/2020  9:15 AM Tat, Eustace Quail, DO [facility] None     ACTION: Home visit completed Next visit planned for one week

## 2019-11-19 ENCOUNTER — Inpatient Hospital Stay (HOSPITAL_COMMUNITY): Payer: Medicare Other

## 2019-11-19 ENCOUNTER — Other Ambulatory Visit: Payer: Self-pay

## 2019-11-19 ENCOUNTER — Inpatient Hospital Stay (HOSPITAL_COMMUNITY)
Admission: EM | Admit: 2019-11-19 | Discharge: 2019-11-26 | DRG: 291 | Disposition: A | Discharge status: Home Health | Payer: Medicare Other | Attending: Internal Medicine | Admitting: Internal Medicine

## 2019-11-19 ENCOUNTER — Emergency Department (HOSPITAL_COMMUNITY): Payer: Medicare Other

## 2019-11-19 ENCOUNTER — Telehealth: Payer: Self-pay | Admitting: Family Medicine

## 2019-11-19 ENCOUNTER — Encounter (HOSPITAL_COMMUNITY): Payer: Self-pay | Admitting: Emergency Medicine

## 2019-11-19 DIAGNOSIS — K5732 Diverticulitis of large intestine without perforation or abscess without bleeding: Secondary | ICD-10-CM | POA: Diagnosis not present

## 2019-11-19 DIAGNOSIS — E7800 Pure hypercholesterolemia, unspecified: Secondary | ICD-10-CM | POA: Diagnosis present

## 2019-11-19 DIAGNOSIS — Z885 Allergy status to narcotic agent status: Secondary | ICD-10-CM

## 2019-11-19 DIAGNOSIS — R0602 Shortness of breath: Secondary | ICD-10-CM | POA: Diagnosis not present

## 2019-11-19 DIAGNOSIS — Z833 Family history of diabetes mellitus: Secondary | ICD-10-CM

## 2019-11-19 DIAGNOSIS — Z794 Long term (current) use of insulin: Secondary | ICD-10-CM | POA: Diagnosis not present

## 2019-11-19 DIAGNOSIS — I130 Hypertensive heart and chronic kidney disease with heart failure and stage 1 through stage 4 chronic kidney disease, or unspecified chronic kidney disease: Secondary | ICD-10-CM | POA: Diagnosis not present

## 2019-11-19 DIAGNOSIS — E559 Vitamin D deficiency, unspecified: Secondary | ICD-10-CM | POA: Diagnosis present

## 2019-11-19 DIAGNOSIS — R6881 Early satiety: Secondary | ICD-10-CM | POA: Diagnosis present

## 2019-11-19 DIAGNOSIS — I509 Heart failure, unspecified: Secondary | ICD-10-CM

## 2019-11-19 DIAGNOSIS — R109 Unspecified abdominal pain: Secondary | ICD-10-CM | POA: Diagnosis present

## 2019-11-19 DIAGNOSIS — Z91040 Latex allergy status: Secondary | ICD-10-CM

## 2019-11-19 DIAGNOSIS — Z20822 Contact with and (suspected) exposure to covid-19: Secondary | ICD-10-CM | POA: Diagnosis not present

## 2019-11-19 DIAGNOSIS — E1122 Type 2 diabetes mellitus with diabetic chronic kidney disease: Secondary | ICD-10-CM | POA: Diagnosis not present

## 2019-11-19 DIAGNOSIS — I428 Other cardiomyopathies: Secondary | ICD-10-CM | POA: Diagnosis present

## 2019-11-19 DIAGNOSIS — E785 Hyperlipidemia, unspecified: Secondary | ICD-10-CM | POA: Diagnosis present

## 2019-11-19 DIAGNOSIS — J9621 Acute and chronic respiratory failure with hypoxia: Secondary | ICD-10-CM | POA: Diagnosis not present

## 2019-11-19 DIAGNOSIS — I5043 Acute on chronic combined systolic (congestive) and diastolic (congestive) heart failure: Secondary | ICD-10-CM | POA: Diagnosis present

## 2019-11-19 DIAGNOSIS — Z7982 Long term (current) use of aspirin: Secondary | ICD-10-CM

## 2019-11-19 DIAGNOSIS — E1165 Type 2 diabetes mellitus with hyperglycemia: Secondary | ICD-10-CM | POA: Diagnosis not present

## 2019-11-19 DIAGNOSIS — Z9981 Dependence on supplemental oxygen: Secondary | ICD-10-CM | POA: Diagnosis not present

## 2019-11-19 DIAGNOSIS — I5082 Biventricular heart failure: Secondary | ICD-10-CM | POA: Diagnosis present

## 2019-11-19 DIAGNOSIS — Z8701 Personal history of pneumonia (recurrent): Secondary | ICD-10-CM

## 2019-11-19 DIAGNOSIS — Z9049 Acquired absence of other specified parts of digestive tract: Secondary | ICD-10-CM

## 2019-11-19 DIAGNOSIS — W19XXXA Unspecified fall, initial encounter: Secondary | ICD-10-CM | POA: Diagnosis present

## 2019-11-19 DIAGNOSIS — I071 Rheumatic tricuspid insufficiency: Secondary | ICD-10-CM | POA: Diagnosis present

## 2019-11-19 DIAGNOSIS — Z823 Family history of stroke: Secondary | ICD-10-CM

## 2019-11-19 DIAGNOSIS — K439 Ventral hernia without obstruction or gangrene: Secondary | ICD-10-CM | POA: Diagnosis not present

## 2019-11-19 DIAGNOSIS — E1140 Type 2 diabetes mellitus with diabetic neuropathy, unspecified: Secondary | ICD-10-CM

## 2019-11-19 DIAGNOSIS — M5126 Other intervertebral disc displacement, lumbar region: Secondary | ICD-10-CM | POA: Diagnosis present

## 2019-11-19 DIAGNOSIS — K5792 Diverticulitis of intestine, part unspecified, without perforation or abscess without bleeding: Secondary | ICD-10-CM

## 2019-11-19 DIAGNOSIS — R197 Diarrhea, unspecified: Secondary | ICD-10-CM

## 2019-11-19 DIAGNOSIS — Z8673 Personal history of transient ischemic attack (TIA), and cerebral infarction without residual deficits: Secondary | ICD-10-CM

## 2019-11-19 DIAGNOSIS — N184 Chronic kidney disease, stage 4 (severe): Secondary | ICD-10-CM | POA: Diagnosis present

## 2019-11-19 DIAGNOSIS — J811 Chronic pulmonary edema: Secondary | ICD-10-CM | POA: Diagnosis not present

## 2019-11-19 DIAGNOSIS — I499 Cardiac arrhythmia, unspecified: Secondary | ICD-10-CM | POA: Diagnosis not present

## 2019-11-19 DIAGNOSIS — I5084 End stage heart failure: Secondary | ICD-10-CM | POA: Diagnosis present

## 2019-11-19 DIAGNOSIS — I252 Old myocardial infarction: Secondary | ICD-10-CM | POA: Diagnosis not present

## 2019-11-19 DIAGNOSIS — K5900 Constipation, unspecified: Secondary | ICD-10-CM | POA: Diagnosis present

## 2019-11-19 DIAGNOSIS — I517 Cardiomegaly: Secondary | ICD-10-CM | POA: Diagnosis not present

## 2019-11-19 DIAGNOSIS — Z79899 Other long term (current) drug therapy: Secondary | ICD-10-CM

## 2019-11-19 DIAGNOSIS — R000 Tachycardia, unspecified: Secondary | ICD-10-CM | POA: Diagnosis not present

## 2019-11-19 DIAGNOSIS — I10 Essential (primary) hypertension: Secondary | ICD-10-CM | POA: Diagnosis not present

## 2019-11-19 DIAGNOSIS — E1142 Type 2 diabetes mellitus with diabetic polyneuropathy: Secondary | ICD-10-CM | POA: Diagnosis present

## 2019-11-19 DIAGNOSIS — Z91018 Allergy to other foods: Secondary | ICD-10-CM | POA: Diagnosis not present

## 2019-11-19 DIAGNOSIS — R0600 Dyspnea, unspecified: Secondary | ICD-10-CM | POA: Diagnosis not present

## 2019-11-19 DIAGNOSIS — J90 Pleural effusion, not elsewhere classified: Secondary | ICD-10-CM | POA: Diagnosis not present

## 2019-11-19 DIAGNOSIS — I110 Hypertensive heart disease with heart failure: Secondary | ICD-10-CM | POA: Diagnosis not present

## 2019-11-19 DIAGNOSIS — I700 Atherosclerosis of aorta: Secondary | ICD-10-CM | POA: Diagnosis not present

## 2019-11-19 DIAGNOSIS — Z9114 Patient's other noncompliance with medication regimen: Secondary | ICD-10-CM

## 2019-11-19 DIAGNOSIS — Z8616 Personal history of COVID-19: Secondary | ICD-10-CM

## 2019-11-19 DIAGNOSIS — I5023 Acute on chronic systolic (congestive) heart failure: Secondary | ICD-10-CM | POA: Diagnosis not present

## 2019-11-19 DIAGNOSIS — I2510 Atherosclerotic heart disease of native coronary artery without angina pectoris: Secondary | ICD-10-CM | POA: Diagnosis present

## 2019-11-19 DIAGNOSIS — D259 Leiomyoma of uterus, unspecified: Secondary | ICD-10-CM | POA: Diagnosis not present

## 2019-11-19 DIAGNOSIS — D638 Anemia in other chronic diseases classified elsewhere: Secondary | ICD-10-CM | POA: Diagnosis present

## 2019-11-19 DIAGNOSIS — Z888 Allergy status to other drugs, medicaments and biological substances status: Secondary | ICD-10-CM | POA: Diagnosis not present

## 2019-11-19 DIAGNOSIS — R55 Syncope and collapse: Secondary | ICD-10-CM | POA: Diagnosis present

## 2019-11-19 LAB — LACTIC ACID, PLASMA: Lactic Acid, Venous: 0.8 mmol/L (ref 0.5–1.9)

## 2019-11-19 LAB — CBC
HCT: 27.4 % — ABNORMAL LOW (ref 36.0–46.0)
Hemoglobin: 8.4 g/dL — ABNORMAL LOW (ref 12.0–15.0)
MCH: 28.8 pg (ref 26.0–34.0)
MCHC: 30.7 g/dL (ref 30.0–36.0)
MCV: 93.8 fL (ref 80.0–100.0)
Platelets: 306 10*3/uL (ref 150–400)
RBC: 2.92 MIL/uL — ABNORMAL LOW (ref 3.87–5.11)
RDW: 16.9 % — ABNORMAL HIGH (ref 11.5–15.5)
WBC: 7.7 10*3/uL (ref 4.0–10.5)
nRBC: 0 % (ref 0.0–0.2)

## 2019-11-19 LAB — I-STAT VENOUS BLOOD GAS, ED
Acid-Base Excess: 2 mmol/L (ref 0.0–2.0)
Bicarbonate: 26.4 mmol/L (ref 20.0–28.0)
Calcium, Ion: 1.29 mmol/L (ref 1.15–1.40)
HCT: 24 % — ABNORMAL LOW (ref 36.0–46.0)
Hemoglobin: 8.2 g/dL — ABNORMAL LOW (ref 12.0–15.0)
O2 Saturation: 86 %
Potassium: 3.9 mmol/L (ref 3.5–5.1)
Sodium: 137 mmol/L (ref 135–145)
TCO2: 28 mmol/L (ref 22–32)
pCO2, Ven: 41.6 mmHg — ABNORMAL LOW (ref 44.0–60.0)
pH, Ven: 7.41 (ref 7.250–7.430)
pO2, Ven: 51 mmHg — ABNORMAL HIGH (ref 32.0–45.0)

## 2019-11-19 LAB — BASIC METABOLIC PANEL
Anion gap: 10 (ref 5–15)
BUN: 61 mg/dL — ABNORMAL HIGH (ref 8–23)
CO2: 26 mmol/L (ref 22–32)
Calcium: 9.6 mg/dL (ref 8.9–10.3)
Chloride: 101 mmol/L (ref 98–111)
Creatinine, Ser: 1.95 mg/dL — ABNORMAL HIGH (ref 0.44–1.00)
GFR calc Af Amer: 31 mL/min — ABNORMAL LOW (ref >60)
GFR calc non Af Amer: 27 mL/min — ABNORMAL LOW (ref >60)
Glucose, Bld: 180 mg/dL — ABNORMAL HIGH (ref 70–99)
Potassium: 3.7 mmol/L (ref 3.5–5.1)
Sodium: 137 mmol/L (ref 135–145)

## 2019-11-19 LAB — CBG MONITORING, ED
Glucose-Capillary: 206 mg/dL — ABNORMAL HIGH (ref 70–99)
Glucose-Capillary: 137 mg/dL — ABNORMAL HIGH (ref 70–99)
Glucose-Capillary: 160 mg/dL — ABNORMAL HIGH (ref 70–99)

## 2019-11-19 LAB — HEPATIC FUNCTION PANEL
ALT: 12 U/L (ref 0–44)
AST: 12 U/L — ABNORMAL LOW (ref 15–41)
Albumin: 2.7 g/dL — ABNORMAL LOW (ref 3.5–5.0)
Alkaline Phosphatase: 80 U/L (ref 38–126)
Bilirubin, Direct: <0.1 mg/dL (ref 0.0–0.2)
Total Bilirubin: 0.4 mg/dL (ref 0.3–1.2)
Total Protein: 6.1 g/dL — ABNORMAL LOW (ref 6.5–8.1)

## 2019-11-19 LAB — TROPONIN I (HIGH SENSITIVITY): Troponin I (High Sensitivity): 21 ng/L — ABNORMAL HIGH (ref <18)

## 2019-11-19 LAB — SARS CORONAVIRUS 2 BY RT PCR (HOSPITAL ORDER, PERFORMED IN ~~LOC~~ HOSPITAL LAB): SARS Coronavirus 2: NEGATIVE

## 2019-11-19 LAB — BRAIN NATRIURETIC PEPTIDE: B Natriuretic Peptide: 863.1 pg/mL — ABNORMAL HIGH (ref 0.0–100.0)

## 2019-11-19 LAB — HIV ANTIBODY (ROUTINE TESTING W REFLEX): HIV Screen 4th Generation wRfx: NONREACTIVE

## 2019-11-19 LAB — LIPASE, BLOOD: Lipase: 23 U/L (ref 11–51)

## 2019-11-19 MED ORDER — SODIUM CHLORIDE 0.9 % IV SOLN
2.0000 g | INTRAVENOUS | Status: DC
  Administered 2019-11-20 – 2019-11-25 (×7): 2 g via INTRAVENOUS
  Filled 2019-11-19 (×2): qty 20
  Filled 2019-11-19: qty 2
  Filled 2019-11-19 (×2): qty 20
  Filled 2019-11-19: qty 2
  Filled 2019-11-19 (×2): qty 20
  Filled 2019-11-19: qty 2

## 2019-11-19 MED ORDER — DULOXETINE HCL 30 MG PO CPEP
30.0000 mg | ORAL_CAPSULE | Freq: Every day | ORAL | Status: DC
  Administered 2019-11-20 – 2019-11-25 (×7): 30 mg via ORAL
  Filled 2019-11-19 (×7): qty 1

## 2019-11-19 MED ORDER — METRONIDAZOLE IN NACL 5-0.79 MG/ML-% IV SOLN
500.0000 mg | Freq: Three times a day (TID) | INTRAVENOUS | Status: DC
  Administered 2019-11-20 – 2019-11-26 (×20): 500 mg via INTRAVENOUS
  Filled 2019-11-19 (×22): qty 100

## 2019-11-19 MED ORDER — HYDRALAZINE HCL 25 MG PO TABS
12.5000 mg | ORAL_TABLET | Freq: Three times a day (TID) | ORAL | Status: DC
  Administered 2019-11-19 – 2019-11-26 (×21): 12.5 mg via ORAL
  Filled 2019-11-19 (×21): qty 1

## 2019-11-19 MED ORDER — PRIMIDONE 50 MG PO TABS
50.0000 mg | ORAL_TABLET | Freq: Two times a day (BID) | ORAL | Status: DC
  Administered 2019-11-19 – 2019-11-26 (×15): 50 mg via ORAL
  Filled 2019-11-19 (×17): qty 1

## 2019-11-19 MED ORDER — ACETAMINOPHEN 325 MG PO TABS
650.0000 mg | ORAL_TABLET | ORAL | Status: DC | PRN
  Administered 2019-11-21 – 2019-11-24 (×2): 650 mg via ORAL
  Filled 2019-11-19 (×3): qty 2

## 2019-11-19 MED ORDER — ONDANSETRON HCL 4 MG/2ML IJ SOLN
4.0000 mg | Freq: Four times a day (QID) | INTRAMUSCULAR | Status: DC | PRN
  Administered 2019-11-24: 4 mg via INTRAVENOUS
  Filled 2019-11-19: qty 2

## 2019-11-19 MED ORDER — HEPARIN SODIUM (PORCINE) 5000 UNIT/ML IJ SOLN
5000.0000 [IU] | Freq: Three times a day (TID) | INTRAMUSCULAR | Status: DC
  Administered 2019-11-19 – 2019-11-26 (×21): 5000 [IU] via SUBCUTANEOUS
  Filled 2019-11-19 (×21): qty 1

## 2019-11-19 MED ORDER — FUROSEMIDE 10 MG/ML IJ SOLN
80.0000 mg | Freq: Two times a day (BID) | INTRAMUSCULAR | Status: DC
  Administered 2019-11-19 – 2019-11-24 (×10): 80 mg via INTRAVENOUS
  Filled 2019-11-19 (×10): qty 8

## 2019-11-19 MED ORDER — GABAPENTIN 100 MG PO CAPS
100.0000 mg | ORAL_CAPSULE | Freq: Two times a day (BID) | ORAL | Status: DC
  Administered 2019-11-19 – 2019-11-26 (×15): 100 mg via ORAL
  Filled 2019-11-19 (×15): qty 1

## 2019-11-19 MED ORDER — PANTOPRAZOLE SODIUM 20 MG PO TBEC
20.0000 mg | DELAYED_RELEASE_TABLET | Freq: Every day | ORAL | Status: DC
  Administered 2019-11-19 – 2019-11-26 (×8): 20 mg via ORAL
  Filled 2019-11-19 (×8): qty 1

## 2019-11-19 MED ORDER — SODIUM CHLORIDE 0.9% FLUSH: 3.0000 mL | INTRAVENOUS | Status: DC | PRN

## 2019-11-19 MED ORDER — INSULIN ASPART 100 UNIT/ML ~~LOC~~ SOLN
0.0000 [IU] | Freq: Three times a day (TID) | SUBCUTANEOUS | Status: DC
  Administered 2019-11-19: 5 [IU] via SUBCUTANEOUS

## 2019-11-19 MED ORDER — INSULIN GLARGINE 100 UNIT/ML ~~LOC~~ SOLN
5.0000 [IU] | Freq: Every day | SUBCUTANEOUS | Status: DC
  Administered 2019-11-20 – 2019-11-26 (×7): 5 [IU] via SUBCUTANEOUS
  Filled 2019-11-19 (×7): qty 0.05

## 2019-11-19 MED ORDER — SODIUM CHLORIDE 0.9 % IV SOLN: 250.0000 mL | INTRAVENOUS | Status: DC | PRN

## 2019-11-19 MED ORDER — INSULIN ASPART 100 UNIT/ML ~~LOC~~ SOLN
0.0000 [IU] | Freq: Three times a day (TID) | SUBCUTANEOUS | Status: DC
  Administered 2019-11-20: 1 [IU] via SUBCUTANEOUS
  Administered 2019-11-20 – 2019-11-22 (×3): 2 [IU] via SUBCUTANEOUS
  Administered 2019-11-23: 1 [IU] via SUBCUTANEOUS
  Administered 2019-11-23: 2 [IU] via SUBCUTANEOUS
  Administered 2019-11-24: 1 [IU] via SUBCUTANEOUS
  Administered 2019-11-24: 2 [IU] via SUBCUTANEOUS
  Administered 2019-11-24 – 2019-11-25 (×2): 1 [IU] via SUBCUTANEOUS

## 2019-11-19 MED ORDER — ATORVASTATIN CALCIUM 80 MG PO TABS
80.0000 mg | ORAL_TABLET | Freq: Every day | ORAL | Status: DC
  Administered 2019-11-20 – 2019-11-25 (×7): 80 mg via ORAL
  Filled 2019-11-19 (×7): qty 1

## 2019-11-19 MED ORDER — ASPIRIN EC 81 MG PO TBEC
81.0000 mg | DELAYED_RELEASE_TABLET | Freq: Every morning | ORAL | Status: DC
  Administered 2019-11-20 – 2019-11-26 (×7): 81 mg via ORAL
  Filled 2019-11-19 (×7): qty 1

## 2019-11-19 MED ORDER — FUROSEMIDE 10 MG/ML IJ SOLN
40.0000 mg | INTRAMUSCULAR | Status: AC
  Administered 2019-11-19: 40 mg via INTRAVENOUS
  Filled 2019-11-19: qty 4

## 2019-11-19 MED ORDER — FLUTICASONE PROPIONATE 50 MCG/ACT NA SUSP
2.0000 | Freq: Every day | NASAL | Status: DC
  Administered 2019-11-21 – 2019-11-26 (×5): 2 via NASAL
  Filled 2019-11-19: qty 16

## 2019-11-19 MED ORDER — SODIUM CHLORIDE 0.9% FLUSH
3.0000 mL | Freq: Two times a day (BID) | INTRAVENOUS | Status: DC
  Administered 2019-11-20 – 2019-11-26 (×12): 3 mL via INTRAVENOUS

## 2019-11-19 NOTE — ED Notes (Signed)
Pt transported to CT on [facility].

## 2019-11-19 NOTE — Progress Notes (Signed)
RT unsuccessful X2 in obtaining ABG. Per EDP, okay to get a VBG.

## 2019-11-19 NOTE — Telephone Encounter (Signed)
Copied from [place] [id number]. Topic: General - Other >> Nov 19, 2019  8:17 AM Celene Kras wrote: Reason for CRM: [place], from [place], called stating that they received a prescription for pt. She states that in the paperwork question 6 was missing. Please advise.

## 2019-11-19 NOTE — ED Provider Notes (Signed)
[facility] DEPART[facility] Provider Note   CSN: [id number] Arrival date & time: 11/19/19  0429     History Chief Complaint  Patient presents with  . Shortness of Breath    Kara Hanson is a 64 y.o. female.  The history is provided by the patient and medical records.    64 year old female with history of CHF with estimated EF of 20 to 25%, chronic kidney disease, diabetes, chronic respiratory failure on 4 L supplemental O2, spinal stenosis, vitamin D deficiency, presenting to the ED with shortness of breath.  Patient states she has been fine over the past few days, woke up this morning with severe shortness of breath.  Upon EMS arrival she was saturating 76% on her home O2 and was very lethargic.  Nonrebreather was applied and mental status has improved.  She is maintaining saturations of 100% on nonrebreather here in ED.  She does report some chest tightness earlier tonight, that is since resolved.  She has not had any significant weight gain or increase in her nighttime orthopnea.  No peripheral edema.  She denies any cough, fever, or sick contacts.  States she does not leave her house due to her oxygen dependency.  She is not vaccinated against COVID-19.  Past Medical History:  Diagnosis Date  . Acute CHF (congestive heart failure) ([place]) 09/24/2018  . Acute on chronic combined systolic and diastolic CHF (congestive heart failure) ([place]) 11/27/2017  . Acute on chronic respiratory failure with hypoxia ([place]) 03/20/2018  . Acute renal failure superimposed on stage 3 chronic kidney disease ([place])   . Acute respiratory distress 11/27/2017  . Acute respiratory failure with hypoxia ([place]) 02/09/2019  . Acute systolic CHF (congestive heart failure) ([place]) 05/19/2019  . AKI (acute kidney injury) ([place]) 11/27/2017  . Atelectasis   . Bulging lumbar disc 11/14/2017  . CAP (community acquired pneumonia) 10/31/2018  . Chest pain 06/17/2016  . CHF (congestive heart failure) ([place])     . Chronic diastolic CHF (congestive heart failure) ([place]) 07/20/2018  . Chronic respiratory failure ([place])   . CKD (chronic kidney disease), stage III 03/20/2018  . Controlled type 2 diabetes mellitus with hyperglycemia ([place]) 04/23/2016  . Controlled type 2 diabetes with neuropathy ([place]) 03/20/2018  . COVID-19 virus infection 07/20/2018  . CVA (cerebral vascular accident) ([place])   . Diabetes mellitus without complication ([place])   . Dyspnea   . Elevated troponin 04/18/2019  . Epigastric pain   . Gout 11/14/2017  . Hypercholesteremia   . Hyperlipidemia LDL goal <70 09/30/2016  . Hypertension   . Myocardial infarction ([place])   . Normochromic normocytic anemia 04/23/2016  . NSTEMI (non-ST elevated myocardial infarction) ([place]) 02/09/2019  . Pneumonia 11/01/2018  . Pneumonia due to COVID-19 virus 07/20/2018  . S/P thoracentesis   . SIRS (systemic inflammatory response syndrome) ([place]) 07/25/2018  . Spinal stenosis   . Stress-induced cardiomyopathy 05/19/2016  . Syncope 04/23/2016  . Type 2 diabetes mellitus with diabetic neuropathy, unspecified ([place]) 05/06/2016  . Vertigo 05/06/2016  . Vitamin D deficiency 04/02/2018    Patient Active Problem List   Diagnosis Date Noted  . Palliative care by specialist   . Goals of care, counseling/discussion   . Cardiorenal syndrome   . Hyponatremia 09/26/2019  . CHF exacerbation ([place]) 09/26/2019  . Acute on chronic systolic CHF (congestive heart failure) ([place]) 05/19/2019  . Acute exacerbation of CHF (congestive heart failure) ([place]) 04/18/2019  . Elevated troponin 04/18/2019  . Acute respiratory failure with hypoxia ([place]) 02/09/2019  .  NSTEMI (non-ST elevated myocardial infarction) ([place]) 02/09/2019  . S/P thoracentesis   . Atelectasis   . Chronic respiratory failure ([place])   . Acute renal failure superimposed on stage 3 chronic kidney disease ([place])   . Epigastric pain   . CHF (congestive heart failure) ([place]) 01/23/2019  . CAP (community acquired pneumonia)  10/31/2018  . Acute CHF (congestive heart failure) ([place]) 09/24/2018  . SIRS (systemic inflammatory response syndrome) ([place]) 07/25/2018  . COVID-19 virus infection 07/20/2018  . Chronic diastolic CHF (congestive heart failure) ([place]) 07/20/2018  . Pneumonia due to COVID-19 virus 07/20/2018  . Vitamin D deficiency 04/02/2018  . Spinal stenosis 03/29/2018  . Acute on chronic respiratory failure with hypoxia ([place]) 03/20/2018  . Controlled type 2 diabetes with neuropathy ([place]) 03/20/2018  . CKD (chronic kidney disease), stage III 03/20/2018  . Acute on chronic combined systolic and diastolic CHF (congestive heart failure) ([place]) 11/27/2017  . AKI (acute kidney injury) ([place]) 11/27/2017  . Acute respiratory distress 11/27/2017  . Bulging lumbar disc 11/14/2017  . Gout 11/14/2017  . Hyperlipidemia LDL goal <70 09/30/2016  . Chest pain 06/17/2016  . Stress-induced cardiomyopathy 05/19/2016  . Type 2 diabetes mellitus with diabetic neuropathy, unspecified ([place]) 05/06/2016  . Vertigo 05/06/2016  . Controlled type 2 diabetes mellitus with hyperglycemia ([place]) 04/23/2016  . Hypertension 04/23/2016  . Normochromic normocytic anemia 04/23/2016  . Syncope 04/23/2016    Past Surgical History:  Procedure Laterality Date  . BIOPSY  01/27/2019   Procedure: BIOPSY;  Surgeon: Otis Brace, MD;  Location: [facility];  Service: Gastroenterology;;  . BLADDER SURGERY    . CARDIAC CATHETERIZATION N/A 04/25/2016   Procedure: Left Heart Cath and Coronary Angiography;  Surgeon: Lorretta Harp, MD;  Location: [facility];  Service: Cardiovascular;  Laterality: N/A;  . CARDIAC CATHETERIZATION  02/11/2019  . CESAREAN SECTION    . CHOLECYSTECTOMY    . ESOPHAGOGASTRODUODENOSCOPY (EGD) WITH PROPOFOL N/A 01/27/2019   Procedure: ESOPHAGOGASTRODUODENOSCOPY (EGD) WITH PROPOFOL;  Surgeon: Otis Brace, MD;  Location: [facility];  Service: Gastroenterology;  Laterality: N/A;  . RIGHT HEART CATH N/A  09/30/2019   Procedure: RIGHT HEART CATH;  Surgeon: Jolaine Artist, MD;  Location: [facility];  Service: Cardiovascular;  Laterality: N/A;  . RIGHT/LEFT HEART CATH AND CORONARY ANGIOGRAPHY N/A 02/11/2019   Procedure: RIGHT/LEFT HEART CATH AND CORONARY ANGIOGRAPHY;  Surgeon: Jolaine Artist, MD;  Location: [facility];  Service: Cardiovascular;  Laterality: N/A;  . VIDEO BRONCHOSCOPY N/A 02/01/2019   Procedure: VIDEO BRONCHOSCOPY WITHOUT FLUORO;  Surgeon: Candee Furbish, MD;  Location: [facility];  Service: Endoscopy;  Laterality: N/A;     OB History   No obstetric history on file.     Family History  Problem Relation Age of Onset  . Diabetes Mellitus II Father   . Stroke Father   . Healthy Mother        She is 45 years old.     Social History   Tobacco Use  . Smoking status: Never Smoker  . Smokeless tobacco: Never Used  Vaping Use  . Vaping Use: Never used  Substance Use Topics  . Alcohol use: No  . Drug use: No    Home Medications Prior to Admission medications   Medication Sig Start Date End Date Taking? Authorizing Provider  Accu-Chek FastClix Lancets MISC USE AS DIRECTED TO TEST BLOOD SUGAR THREE TIMES DAILY 06/10/19   [name], MD  acetaminophen (TYLENOL) 500 MG tablet Take 500-1,000  mg by mouth every 6 (six) hours as needed for mild pain or headache.    [provider]  allopurinol (ZYLOPRIM) 100 MG tablet Take 2 tablets (200 mg total) by mouth at bedtime. 10/06/19   Georgette Shell, MD  aspirin EC 81 MG tablet Take 81 mg by mouth in the morning.     [provider]  atorvastatin (LIPITOR) 80 MG tablet Take 80 mg by mouth at bedtime.     [provider]  Blood Glucose Monitoring Suppl (ACCU-CHEK GUIDE) w/Device KIT 1 each by Does not apply route 3 (three) times daily. Patient not taking: Reported on 11/06/2019 06/10/19   [name], MD  Continuous Blood Gluc Receiver (DEXCOM G6 RECEIVER) DEVI Use 1 each as  directed Patient not taking: Reported on 11/06/2019 10/25/19   [name], MD  Continuous Blood Gluc Sensor (DEXCOM G6 SENSOR) MISC Use 1 each as directed Patient not taking: Reported on 11/06/2019 10/25/19   [name], MD  Continuous Blood Gluc Transmit (DEXCOM G6 TRANSMITTER) MISC Use 1 each as directed Patient not taking: Reported on 11/06/2019 10/25/19   [name], MD  DULoxetine (CYMBALTA) 30 MG capsule Take 1 capsule (30 mg total) by mouth daily. Patient taking differently: Take 30 mg by mouth at bedtime.  02/06/19   Bonnell Public, MD  EASY COMFORT PEN NEEDLES 31G X 5 MM MISC USE FOUR TIMES PER DAY FOR INSULIN ADMINISTRATION Patient not taking: Reported on 11/06/2019 10/02/18   [name], MD  ergocalciferol (VITAMIN D2) 1.25 MG (50000 UT) capsule Take 50,000 Units by mouth every Sunday.  Patient not taking: Reported on 11/13/2019 06/26/19   [provider]  gabapentin (NEURONTIN) 100 MG capsule TAKE 1 CAPSULE (100 MG TOTAL) BY MOUTH 2 (TWO) TIMES DAILY. 10/17/19   [name], MD  glucose blood (ACCU-CHEK GUIDE) test strip USE AS DIRECTED TO TEST BLOOD SUGAR THREE TIMES DAILY Patient not taking: Reported on 11/06/2019 06/10/19   [name], MD  hydrALAZINE (APRESOLINE) 25 MG tablet Take 0.5 tablets (12.5 mg total) by mouth 3 (three) times daily. 10/06/19 10/05/20  Georgette Shell, MD  insulin aspart (NOVOLOG) 100 UNIT/ML injection 0 to 12 units subcutaneously 3 times daily before meals as per sliding scale Patient not taking: Reported on 11/06/2019 10/24/19   [name], MD  Insulin Glargine (BASAGLAR KWIKPEN) 100 UNIT/ML Inject 0.05 mLs (5 Units total) into the skin daily. Patient not taking: Reported on 11/06/2019 10/24/19   [name], MD  Lancet Device MISC Use as instructed 3 times daily Patient not taking: Reported on 11/06/2019 04/26/19   [name], MD  lansoprazole (PREVACID) 15 MG capsule TAKE 1 CAPSULE (15 MG TOTAL) BY MOUTH DAILY. Patient  taking differently: Take 15 mg by mouth daily before breakfast.  09/05/19   [name], MD  Misc. Devices MISC Portable oxygen concentrator.  Diagnosis-chronic respiratory failure. 09/03/18   [name], MD  Misc. Devices MISC [place]or with seat. Dx: Congestive Heart Failure 10/19/18   [name], MD  Misc. Devices MISC Scale; Dx - CHF 06/10/19   [name], MD  Misc. [facility] bed.  Diagnosis CHF.  Lifetime use.  Weight 165 lbs. 07/10/19   [name], MD  Misc. Devices Malcolm chair DX CHF 10/10/19   [name], MD  Misc. Devices MISC Manual wheelchai with cushion, anti - tippers, brakes and lock.  Diagnosis congestive heart failure.  Weight 140 pounds. 10/14/19   [name], MD  Multiple Vitamins-Minerals (MULTIVITAMIN  WITH MINERALS) tablet Take 1 tablet by mouth daily with breakfast.     [provider]  OXYGEN Inhale 4 L/min into the lungs continuous.    [provider]  polyethylene glycol (MIRALAX / GLYCOLAX) 17 g packet Take 17 g by mouth daily as needed for moderate constipation. Patient not taking: Reported on 10/08/2019 05/22/19   Geradine Girt, DO  primidone (MYSOLINE) 50 MG tablet Take 1 tablet (50 mg total) by mouth in the morning and at bedtime. 08/19/19   Tat, Eustace Quail, DO  sodium chloride (OCEAN) 0.65 % SOLN nasal spray Place 1 spray into both nostrils as needed for congestion.     [provider]  SUPER B COMPLEX/C CAPS Take 1 capsule by mouth daily.     [provider]  torsemide (DEMADEX) 20 MG tablet Take 2 tablets (40 mg total) by mouth 2 (two) times daily. 10/08/19 01/06/20  Bensimhon, Shaune Pascal, MD    Allergies    Garlic, Isosorbide, Latex, Morphine and related, and Other  Review of Systems   Review of Systems  Respiratory: Positive for chest tightness and shortness of breath.   All other systems reviewed and are negative.   Physical Exam Updated Vital Signs BP 133/67 (BP Location: Left Arm)    Pulse 92   Temp 97.8 F (36.6 C) (Oral)   Resp 16   SpO2 100%   Physical Exam Vitals and nursing note reviewed.  Constitutional:      Appearance: She is well-developed.  HENT:     Head: Normocephalic and atraumatic.  Eyes:     Conjunctiva/sclera: Conjunctivae normal.     Pupils: Pupils are equal, round, and reactive to light.  Cardiovascular:     Rate and Rhythm: Normal rate and regular rhythm.     Heart sounds: Normal heart sounds.  Pulmonary:     Effort: Pulmonary effort is normal.     Breath sounds: Normal breath sounds. No wheezing or rhonchi.  Abdominal:     General: Bowel sounds are normal.     Palpations: Abdomen is soft.  Musculoskeletal:        General: Normal range of motion.     Cervical back: Normal range of motion.     Comments: No peripheral edema noted  Skin:    General: Skin is warm and dry.  Neurological:     Mental Status: She is alert and oriented to person, place, and time.     ED Results / Procedures / Treatments   Labs (all labs ordered are listed, but only abnormal results are displayed) Labs Reviewed  BASIC METABOLIC PANEL - Abnormal; Notable for the following components:      Result Value   Glucose, Bld 180 (*)    BUN 61 (*)    Creatinine, Ser 1.95 (*)    GFR calc non Af Amer 27 (*)    GFR calc Af Amer 31 (*)    All other components within normal limits  CBC - Abnormal; Notable for the following components:   RBC 2.92 (*)    Hemoglobin 8.4 (*)    HCT 27.4 (*)    RDW 16.9 (*)    All other components within normal limits  BRAIN NATRIURETIC PEPTIDE - Abnormal; Notable for the following components:   B Natriuretic Peptide 863.1 (*)    All other components within normal limits  TROPONIN I (HIGH SENSITIVITY) - Abnormal; Notable for the following components:   Troponin I (High Sensitivity) 21 (*)  All other components within normal limits  SARS CORONAVIRUS 2 BY RT PCR [facility] ORDER, [facility])  LACTIC ACID,  PLASMA  LACTIC ACID, PLASMA  BLOOD GAS, ARTERIAL  BLOOD GAS, VENOUS    EKG EKG Interpretation  Date/Time:  Tuesday November 19 2019 04:32:51 EDT Ventricular Rate:  94 PR Interval:    QRS Duration: 87 QT Interval:  368 QTC Calculation: 461 R Axis:   36 Text Interpretation: Sinus rhythm Abnormal T, consider ischemia, diffuse leads No significant change since last tracing Confirmed by Ripley Fraise [phone]) on 11/19/2019 5:54:07 AM   Radiology DG Chest Portable 1 View  Result Date: 11/19/2019 CLINICAL DATA:  Shortness of breath, lethargic EXAM: PORTABLE CHEST 1 VIEW COMPARISON:  Radiograph 09/26/2019 FINDINGS: Central vascular congestion with diffusely increased hazy interstitial opacities and more patchy and confluent infrahilar opacity partially silhouetting the lung bases likely with bilateral effusions. Fissural and septal thickening is noted as well. No pneumothorax. Mild cardiomegaly similar to comparison imaging accounting for differences in technique. The aorta is calcified. The remaining cardiomediastinal contours are unremarkable. No acute osseous or soft tissue abnormality. Degenerative changes are present in the imaged spine and shoulders. Telemetry leads overlie the chest. IMPRESSION: Features of pulmonary edema with bilateral effusions. More patchy and confluent infrahilar opacity could reflect developing alveolar edema or infection/consolidation. Cardiomegaly similar to priors. Aortic Atherosclerosis (ICD10-I70.0). Electronically Signed   By: Lovena Le M.D.   On: 11/19/2019 05:07    Procedures Procedures (including critical care time)  Medications Ordered in ED Medications  furosemide (LASIX) injection 40 mg (has no administration in time range)    ED Course  I have reviewed the triage vital signs and the nursing notes.  Pertinent labs & imaging results that were available during my care of the patient were reviewed by me and considered in my medical decision making  (see chart for details).    MDM Rules/Calculators/A&P    63 y.o. F here with SOB.  Found to be lethargic and hypoxic on her baseline 4L supplemental O2.  NRB placed by EMS with improvement of mental status and oxygenation.  On arrival she is AAOx3, O2 sats 100% on NRB, no acute distress noted.  No significant peripheral edema.  Denies chest pain.  Has longstanding hx of CHF, has been compliant with all medications.  Denies sick contacts or covid exposures.  Labs pending along with  CXR and covid screen.  6:09 AM Patient reassessed-- able to reduce to 6L and maintain good saturations.  Work-up is most consistent with CHF.  BNP 863, trop 21.  Remains without active chest pain.  Will give dose of lasix and admit for ongoing care given increased O2 requirement.  Spoke with Dr. Marlowe Sax-- will admit for ongoing care.  Final Clinical Impression(s) / ED Diagnoses Final diagnoses:  Acute on chronic systolic CHF (congestive heart failure) [facility])    Rx / DC Orders ED Discharge Orders    None       Larene Pickett, PA-C 11/19/19 [id number]    Ripley Fraise, MD 11/19/19 [phone]

## 2019-11-19 NOTE — H&P (Signed)
History and Physical    Kara Hanson WLS:[id number] DOB: January 15, 1956 DOA: 11/19/2019  Referring MD/NP/PA: Shela Leff, MD PCP: [name], MD  Patient coming from: Home via EMS  Chief Complaint: Could not breathe.  I have personally briefly reviewed patient's old medical records in [place]   HPI: Kara Hanson is a 64 y.o. female with medical history significant of hypertension, systolic congestive heart failure (last EF 20-25%), insulin-dependent diabetes mellitus, chronic hypoxic respiratory failure on 4 L of nasal cannula oxygen, pneumonia due to COVID-19/2020, chronic kidney disease stage III presents with complaints of being unable woken up out of her sleep unable to breathe. Reported associated symptoms of chest tightness, epigastric abdominal discomfort, abdominal distention, and nausea. She has been compliant with all of her home medications. Denies any fever, chills, cough, weight gain, leg swelling, vomiting, dysuria, diarrhea, or constipation. She reports that is hard to take deep inspiratory breath. Her last bowel movement was yesterday.  EMS noted that the patient was satting 76% on home oxygen of 4 L and improved after being placed on a nonrebreather.  ED Course: Upon admission into the emergency department patient was seen to be afebrile, respirations 16-32, blood pressures maintained, and O2 saturations maintained initially on nonrebreather.  Patient was able to be weaned down to 6 L nasal cannula oxygen.  Labs were significant for BUN 61, creatinine 1.95, glucose 180, BNP 863.1, and troponin 21.  Chest x-ray showed cardiomegaly with pulmonary edema and bilateral effusions.  Patient had been given 40 mg Lasix IV  Review of Systems  Constitutional: Positive for malaise/fatigue. Negative for fever.  HENT: Negative for congestion and nosebleeds.   Eyes: Negative for double vision and pain.  Respiratory: Positive for shortness of breath. Negative for cough.    Cardiovascular: Negative for chest pain and leg swelling.  Gastrointestinal: Positive for abdominal pain and nausea. Negative for diarrhea and vomiting.  Genitourinary: Negative for dysuria and hematuria.  Musculoskeletal: Negative for back pain and falls.  Skin: Negative for itching and rash.  Neurological: Negative for focal weakness and loss of consciousness.  Psychiatric/Behavioral: Negative for substance abuse. The patient has insomnia.     Past Medical History:  Diagnosis Date  . Acute CHF (congestive heart failure) ([place]) 09/24/2018  . Acute on chronic combined systolic and diastolic CHF (congestive heart failure) ([place]) 11/27/2017  . Acute on chronic respiratory failure with hypoxia ([place]) 03/20/2018  . Acute renal failure superimposed on stage 3 chronic kidney disease ([place])   . Acute respiratory distress 11/27/2017  . Acute respiratory failure with hypoxia ([place]) 02/09/2019  . Acute systolic CHF (congestive heart failure) ([place]) 05/19/2019  . AKI (acute kidney injury) ([place]) 11/27/2017  . Atelectasis   . Bulging lumbar disc 11/14/2017  . CAP (community acquired pneumonia) 10/31/2018  . Chest pain 06/17/2016  . CHF (congestive heart failure) ([place])   . Chronic diastolic CHF (congestive heart failure) ([place]) 07/20/2018  . Chronic respiratory failure ([place])   . CKD (chronic kidney disease), stage III 03/20/2018  . Controlled type 2 diabetes mellitus with hyperglycemia ([place]) 04/23/2016  . Controlled type 2 diabetes with neuropathy ([place]) 03/20/2018  . COVID-19 virus infection 07/20/2018  . CVA (cerebral vascular accident) ([place])   . Diabetes mellitus without complication ([place])   . Dyspnea   . Elevated troponin 04/18/2019  . Epigastric pain   . Gout 11/14/2017  . Hypercholesteremia   . Hyperlipidemia LDL goal <70 09/30/2016  . Hypertension   . Myocardial infarction ([place])   .  Normochromic normocytic anemia 04/23/2016  . NSTEMI (non-ST elevated myocardial infarction) ([place]) 02/09/2019  . Pneumonia  11/01/2018  . Pneumonia due to COVID-19 virus 07/20/2018  . S/P thoracentesis   . SIRS (systemic inflammatory response syndrome) ([place]) 07/25/2018  . Spinal stenosis   . Stress-induced cardiomyopathy 05/19/2016  . Syncope 04/23/2016  . Type 2 diabetes mellitus with diabetic neuropathy, unspecified ([place]) 05/06/2016  . Vertigo 05/06/2016  . Vitamin D deficiency 04/02/2018    Past Surgical History:  Procedure Laterality Date  . BIOPSY  01/27/2019   Procedure: BIOPSY;  Surgeon: Otis Brace, MD;  Location: [facility];  Service: Gastroenterology;;  . BLADDER SURGERY    . CARDIAC CATHETERIZATION N/A 04/25/2016   Procedure: Left Heart Cath and Coronary Angiography;  Surgeon: Lorretta Harp, MD;  Location: [facility];  Service: Cardiovascular;  Laterality: N/A;  . CARDIAC CATHETERIZATION  02/11/2019  . CESAREAN SECTION    . CHOLECYSTECTOMY    . ESOPHAGOGASTRODUODENOSCOPY (EGD) WITH PROPOFOL N/A 01/27/2019   Procedure: ESOPHAGOGASTRODUODENOSCOPY (EGD) WITH PROPOFOL;  Surgeon: Otis Brace, MD;  Location: [facility];  Service: Gastroenterology;  Laterality: N/A;  . RIGHT HEART CATH N/A 09/30/2019   Procedure: RIGHT HEART CATH;  Surgeon: Jolaine Artist, MD;  Location: [facility];  Service: Cardiovascular;  Laterality: N/A;  . RIGHT/LEFT HEART CATH AND CORONARY ANGIOGRAPHY N/A 02/11/2019   Procedure: RIGHT/LEFT HEART CATH AND CORONARY ANGIOGRAPHY;  Surgeon: Jolaine Artist, MD;  Location: [facility];  Service: Cardiovascular;  Laterality: N/A;  . VIDEO BRONCHOSCOPY N/A 02/01/2019   Procedure: VIDEO BRONCHOSCOPY WITHOUT FLUORO;  Surgeon: Candee Furbish, MD;  Location: [facility];  Service: Endoscopy;  Laterality: N/A;     reports that she has never smoked. She has never used smokeless tobacco. She reports that she does not drink alcohol and does not use drugs.  Allergies  Allergen Reactions  . Garlic Shortness Of Breath, Itching, Swelling and Other (See  Comments)    "Raw garlic" = Hand itching and swelling  . Isosorbide Other (See Comments)    Sneezing and runny nose  . Latex Itching  . Morphine And Related Itching and Other (See Comments)    Headache   . Other Itching and Other (See Comments)    Reaction to newspaper ink- Headaches, also    Family History  Problem Relation Age of Onset  . Diabetes Mellitus II Father   . Stroke Father   . Healthy Mother        She is 60 years old.     Prior to Admission medications   Medication Sig Start Date End Date Taking? Authorizing Provider  Accu-Chek FastClix Lancets MISC USE AS DIRECTED TO TEST BLOOD SUGAR THREE TIMES DAILY 06/10/19   [name], MD  acetaminophen (TYLENOL) 500 MG tablet Take 500-1,000 mg by mouth every 6 (six) hours as needed for mild pain or headache.    [provider]  allopurinol (ZYLOPRIM) 100 MG tablet Take 2 tablets (200 mg total) by mouth at bedtime. 10/06/19   Georgette Shell, MD  aspirin EC 81 MG tablet Take 81 mg by mouth in the morning.     [provider]  atorvastatin (LIPITOR) 80 MG tablet Take 80 mg by mouth at bedtime.     [provider]  Blood Glucose Monitoring Suppl (ACCU-CHEK GUIDE) w/Device KIT 1 each by Does not apply route 3 (three) times daily. Patient not taking: Reported on 11/06/2019 06/10/19   [name], MD  Continuous Blood Gluc Receiver (DEXCOM G6 RECEIVER) DEVI Use 1 each as directed Patient not taking: Reported on 11/06/2019 10/25/19   [name], MD  Continuous Blood Gluc Sensor (DEXCOM G6 SENSOR) MISC Use 1 each as directed Patient not taking: Reported on 11/06/2019 10/25/19   [name], MD  Continuous Blood Gluc Transmit (DEXCOM G6 TRANSMITTER) MISC Use 1 each as directed Patient not taking: Reported on 11/06/2019 10/25/19   [name], MD  DULoxetine (CYMBALTA) 30 MG capsule Take 1 capsule (30 mg total) by mouth daily. Patient taking differently: Take 30 mg by mouth at bedtime.  02/06/19    Bonnell Public, MD  EASY COMFORT PEN NEEDLES 31G X 5 MM MISC USE FOUR TIMES PER DAY FOR INSULIN ADMINISTRATION 10/02/18   [name], MD  ergocalciferol (VITAMIN D2) 1.25 MG (50000 UT) capsule Take 50,000 Units by mouth every Sunday.  06/26/19   [provider]  gabapentin (NEURONTIN) 100 MG capsule TAKE 1 CAPSULE (100 MG TOTAL) BY MOUTH 2 (TWO) TIMES DAILY. 10/17/19   [name], MD  glucose blood (ACCU-CHEK GUIDE) test strip USE AS DIRECTED TO TEST BLOOD SUGAR THREE TIMES DAILY 06/10/19   [name], MD  hydrALAZINE (APRESOLINE) 25 MG tablet Take 0.5 tablets (12.5 mg total) by mouth 3 (three) times daily. 10/06/19 10/05/20  Georgette Shell, MD  insulin aspart (NOVOLOG) 100 UNIT/ML injection 0 to 12 units subcutaneously 3 times daily before meals as per sliding scale 10/24/19   [name], MD  Insulin Glargine (BASAGLAR KWIKPEN) 100 UNIT/ML Inject 0.05 mLs (5 Units total) into the skin daily. Patient not taking: Reported on 11/06/2019 10/24/19   [name], MD  Lancet Device MISC Use as instructed 3 times daily 04/26/19   [name], MD  lansoprazole (PREVACID) 15 MG capsule TAKE 1 CAPSULE (15 MG TOTAL) BY MOUTH DAILY. Patient taking differently: Take 15 mg by mouth daily before breakfast.  09/05/19   [name], MD  Misc. Devices MISC Portable oxygen concentrator.  Diagnosis-chronic respiratory failure. 09/03/18   [name], MD  Misc. Devices MISC Roll[place]or with seat. Dx: Congestive Heart Failure 10/19/18   [name], MD  Misc. Devices MISC Scale; Dx - CHF 06/10/19   [name], MD  Misc. [facility] bed.  Diagnosis CHF.  Lifetime use.  Weight 165 lbs. 07/10/19   [name], MD  Misc. Devices Maili chair DX CHF 10/10/19   [name], MD  Misc. Devices MISC Manual wheelchai with cushion, anti - tippers, brakes and lock.  Diagnosis congestive heart failure.  Weight 140 pounds. 10/14/19   [name], MD  Multiple  Vitamins-Minerals (MULTIVITAMIN WITH MINERALS) tablet Take 1 tablet by mouth daily with breakfast.     [provider]  OXYGEN Inhale 4 L/min into the lungs continuous.    [provider]  polyethylene glycol (MIRALAX / GLYCOLAX) 17 g packet Take 17 g by mouth daily as needed for moderate constipation. Patient not taking: Reported on 10/08/2019 05/22/19   Geradine Girt, DO  primidone (MYSOLINE) 50 MG tablet Take 1 tablet (50 mg total) by mouth in the morning and at bedtime. 08/19/19   Tat, Eustace Quail, DO  sodium chloride (OCEAN) 0.65 % SOLN nasal spray Place 1 spray into both nostrils as needed for congestion.     [provider]  SUPER B COMPLEX/C CAPS Take 1 capsule by mouth daily.     [provider]  torsemide (DEMADEX) 20 MG tablet Take 2 tablets (40  mg total) by mouth 2 (two) times daily. 10/08/19 01/06/20  Bensimhon, Shaune Pascal, MD    Physical Exam:  Constitutional: Older female who appears to be in some respiratory distress. Vitals:   11/19/19 0500 11/19/19 0615 11/19/19 0645 11/19/19 0715  BP: 134/75  (!) 148/75 133/84  Pulse: 87 86 81 90  Resp: (!) 28 (!) 28 (!) 23 (!) 32  Temp:      TempSrc:      SpO2: 100% 100% 100% 100%   Eyes: PERRL, lids and conjunctivae normal ENMT: Mucous membranes are moist. Posterior pharynx clear of any exudate or lesions.  Neck: normal, supple, no masses, no thyromegaly.  Positive JVD Respiratory: Patient tachypneic with positive crackles heard in both lung fields.  Currently on 6 L nasal cannula oxygen to maintain O2 saturations.. Cardiovascular: Regular rate and rhythm, no murmurs / rubs / gallops.  Trace lower extremity edema. 2+ pedal pulses. No carotid bruits.  Abdomen: no tenderness, no masses palpated. No hepatosplenomegaly. Bowel sounds positive.  Musculoskeletal: no clubbing / cyanosis. No joint deformity upper and lower extremities. Good ROM, no contractures. Normal muscle tone.  Skin: no rashes, lesions, ulcers.  No induration Neurologic: CN 2-12 grossly intact. Sensation intact, DTR normal. Strength 5/5 in all 4.  Psychiatric: Normal judgment and insight. Alert and oriented x 3. Normal mood.     Labs on Admission: I have personally reviewed following labs and imaging studies  CBC: Recent Labs  Lab 11/19/19 0510  WBC 7.7  HGB 8.4*  HCT 27.4*  MCV 93.8  PLT 923   Basic Metabolic Panel: Recent Labs  Lab 11/19/19 0510  NA 137  K 3.7  CL 101  CO2 26  GLUCOSE 180*  BUN 61*  CREATININE 1.95*  CALCIUM 9.6   GFR: Estimated Creatinine Clearance: 24.2 mL/min (A) (by C-G formula based on SCr of 1.95 mg/dL (H)). Liver Function Tests: No results for input(s): AST, ALT, ALKPHOS, BILITOT, PROT, ALBUMIN in the last 168 hours. No results for input(s): LIPASE, AMYLASE in the last 168 hours. No results for input(s): AMMONIA in the last 168 hours. Coagulation Profile: No results for input(s): INR, PROTIME in the last 168 hours. Cardiac Enzymes: No results for input(s): CKTOTAL, CKMB, CKMBINDEX, TROPONINI in the last 168 hours. BNP (last 3 results) No results for input(s): PROBNP in the last 8760 hours. HbA1C: No results for input(s): HGBA1C in the last 72 hours. CBG: No results for input(s): GLUCAP in the last 168 hours. Lipid Profile: No results for input(s): CHOL, HDL, LDLCALC, TRIG, CHOLHDL, LDLDIRECT in the last 72 hours. Thyroid Function Tests: No results for input(s): TSH, T4TOTAL, FREET4, T3FREE, THYROIDAB in the last 72 hours. Anemia Panel: No results for input(s): VITAMINB12, FOLATE, FERRITIN, TIBC, IRON, RETICCTPCT in the last 72 hours. Urine analysis:    Component Value Date/Time   COLORURINE AMBER (A) 09/26/2019 1645   APPEARANCEUR CLOUDY (A) 09/26/2019 1645   LABSPEC 1.014 09/26/2019 1645   PHURINE 5.0 09/26/2019 1645   GLUCOSEU 50 (A) 09/26/2019 1645   HGBUR SMALL (A) 09/26/2019 1645   BILIRUBINUR NEGATIVE 09/26/2019 1645   BILIRUBINUR negative 12/13/2017 1514    KETONESUR NEGATIVE 09/26/2019 1645   PROTEINUR >=300 (A) 09/26/2019 1645   UROBILINOGEN 0.2 12/13/2017 1514   NITRITE NEGATIVE 09/26/2019 1645   LEUKOCYTESUR SMALL (A) 09/26/2019 1645   Sepsis Labs: Recent Results (from the past 240 hour(s))  SARS Coronavirus 2 by RT PCR (hospital order, performed in [facility] hospital lab) Nasopharyngeal Nasopharyngeal Swab  Status: None   Collection Time: 11/19/19  4:39 AM   Specimen: Nasopharyngeal Swab  Result Value Ref Range Status   SARS Coronavirus 2 NEGATIVE NEGATIVE Final    Comment: (NOTE) SARS-CoV-2 target nucleic acids are NOT DETECTED.  The SARS-CoV-2 RNA is generally detectable in upper and lower respiratory specimens during the acute phase of infection. The lowest concentration of SARS-CoV-2 viral copies this assay can detect is 250 copies / mL. A negative result does not preclude SARS-CoV-2 infection and should not be used as the sole basis for treatment or other patient management decisions.  A negative result may occur with improper specimen collection / handling, submission of specimen other than nasopharyngeal swab, presence of viral mutation(s) within the areas targeted by this assay, and inadequate number of viral copies (<250 copies / mL). A negative result must be combined with clinical observations, patient history, and epidemiological information.  Fact Sheet for Patients:   StrictlyIdeas.no  Fact Sheet for Healthcare Providers: BankingDealers.co.za  This test is not yet approved or  cleared by the [place] FDA and has been authorized for detection and/or diagnosis of SARS-CoV-2 by FDA under an Emergency Use Authorization (EUA).  This EUA will remain in effect (meaning this test can be used) for the duration of the COVID-19 declaration under Section 564(b)(1) of the Act, 21 U.S.C. section 360bbb-3(b)(1), unless the authorization is terminated or revoked  sooner.  Performed at [facility], [place] [address] [place], [place] [address]      Radiological Exams on Admission: DG Chest Portable 1 View  Result Date: 11/19/2019 CLINICAL DATA:  Shortness of breath, lethargic EXAM: PORTABLE CHEST 1 VIEW COMPARISON:  Radiograph 09/26/2019 FINDINGS: Central vascular congestion with diffusely increased hazy interstitial opacities and more patchy and confluent infrahilar opacity partially silhouetting the lung bases likely with bilateral effusions. Fissural and septal thickening is noted as well. No pneumothorax. Mild cardiomegaly similar to comparison imaging accounting for differences in technique. The aorta is calcified. The remaining cardiomediastinal contours are unremarkable. No acute osseous or soft tissue abnormality. Degenerative changes are present in the imaged spine and shoulders. Telemetry leads overlie the chest. IMPRESSION: Features of pulmonary edema with bilateral effusions. More patchy and confluent infrahilar opacity could reflect developing alveolar edema or infection/consolidation. Cardiomegaly similar to priors. Aortic Atherosclerosis (ICD10-I70.0). Electronically Signed   By: Lovena Le M.D.   On: 11/19/2019 05:07    EKG: Independently reviewed.  Sinus rhythm at 94 bpm similar to previous tracings  Assessment/Plan Acute respiratory failure with hypoxia combined systolic and diastolic CHF exacerbation: Acute on chronic.  Patient presents with complaints of shortness of breath despite compliance with torsemide 40 mg twice daily.Marland Kitchen  BNP elevated at 863.1.  Currently requiring 6 L of nasal cannula oxygen to maintain O2 saturations.  Chest x-ray revealed cardiomegaly with pulmonary edema and bilateral effusions.  Last hospitalized for CHF exacerbation in June where echocardiogram revealed EF of 20 to 25% with grade 3 diastolic dysfunction on 1/93/79 when heart function previously had been 40-45% in 04/19/19.  During that patient patient also  underwent right heart cath which showed significantly elevated biventricular filling pressures.  She had been given 40 mg of Lasix IV, but had had minimal output. -Admit to a telemetry bed -Heart failure orders set  initiated  -Continuous pulse oximetry with nasal cannula oxygen as needed to keep O2 saturations >92% -BiPAP if needed -Strict I&Os and daily weights -Elevate lower extremities -Lasix 80 mg IV Bid -Reassess in a.m. and adjust  diuresis as needed. -Cardiology consulted, will follow-up for any further recommendations   Abdominal pain: Patient reports having centralized discomfort and distention in her abdomen.  Reports last bowel movement was yesterday.  Previous CT imaging did note signs of a small umbilical hernia. -Check CT scan of the abdomen and pelvis without contrast.  Diabetes mellitus type 2, uncontrolled with peripheral neuropathy: Patient presents with initial glucose of 180.  Last hemoglobin A1c was 7.1 on 09/26/2019.  Home medications including glargine 5 units daily and sliding scale with meals. -Hypoglycemic protocol -Continue home glargine dose and gabapentin -CBGs before every meal with sensitive sliding scale insulin  -Adjust regimen as needed  Essential hypertension: Blood pressures currently maintained.  Home blood pressure medications include hydralazine 12.5 mg 3 times daily and Torsemide 40 mg twice daily. -Continue hydralazine as tolerated  Chronic kidney disease stage IV: Creatinine 1.95 with BUN 61.  This appears to be around patient's baseline. -Continue to monitor kidney function with diuresis   Normocytic anemia: Chronic.  Hemoglobin 8.4 g/dL which appears around patient's baseline of 8 - 9 g/dL. -Continue to monitor    Hyperlipidemia  -Continue atorvastatin  DVT prophylaxis: Heparin Code Status: Full Family Communication: Daughter updated Disposition Plan: To be determined Consults called: Cardiology Admission status: Inpatient  Norval Morton MD [facility] Pager [phone]   If 7PM-7AM, please contact night-coverage [email or web address] Password [facility]  11/19/2019, 8:07 AM

## 2019-11-19 NOTE — ED Notes (Signed)
Called RT as pt appeared distressed and uncomfortable for assessment and bipap.. Dr Tamala Julian at bedside.

## 2019-11-19 NOTE — Consult Note (Addendum)
Cardiology Consultation:   Patient ID: Kara Hanson MRN: [id number]; DOB: 1955/04/17  Admit date: 11/19/2019 Date of Consult: 11/19/2019  Primary Care Provider: [name], MD [facility] Cardiologist: Candee Furbish, MD  [facility] Electrophysiologist:  None    Patient Profile:   Kara Hanson is a 64 y.o. female with a history of mild CAD on cath in 02/2019, chronic combined CHF with LVEF of 20-25% on Echo in 09/2019, chronic respiratory failure on 4L of O2 at home, prior CVA, hypertension, hyperlipidemia, type 2 diabetes on insulin, CKD stage IV, chronic anemia, and COVID pneumonia in 07/2018 who is being seen today for the evaluation of CHF at the request of Dr. Tamala Julian.  History of Present Illness:   Kara Hanson is a 64 year old female with the above history who is followed by Dr. Marlou Porch.   Patient admitted for syncope in 2018. Echo at that time showed reduced LVEF of 30-35%. Cardiac cath showed normal coronaries and LVEF was normal by LV gram. No etiology for syncope found. She was though to maybe have stress-induced cardiomyopathy. She has been followed by Dr. Marlou Porch since that time and has had several interval Echo. Repeat Echos in 11/2017 and 10/2018 showed normal LVEF of 50-55% and grade 2 diastolic dysfunction. She has had multiple admission over the past year for acute on chronic combined CHF and has a history of poor medication compliance. Echo in 01/2019 showed LVEF of 40-45%. Echo in 02/2019 showed LVEF of 30-35% with grade 3 diastolic dysfunction. She underwent right/left heart catheterization in 02/2019 which showed mild CAD and normal cardiac output.  Most recently admitted from 09/26/2019 to 10/07/2019 for acute on chronic combined CHF. Echo showed LVEF of 20-25% with global hypokinesis and grade 3 diastolic CHF. During this admission Advanced CHF team was consulted. Dr. Haroldine Laws performed right heart catheterization on 09/30/2019 which showed markedly elevated  biventricular filling pressure and marked discrepancy between Fick and Thermo cardiac outputs. She was started on IV Milrinone in addition to high-dose IV Lasix. She underwent extensive work-up for cardiomyopathy. Cardiac MRI which showed no evidence of cardiac amyloidosis with LVEF of 26% and RVEF of 46%. PYP scan was equivocal for TTR amyloidosis but this was felt to be unlikely given negative MRI. Milrinone was ultimately able to be stopped. Patient was discharged on Torsemide 46m twice daily and Hydralazine 12.559mthree times daily. No ACEi/ARB/ARNI/Spironolaconte or Digoxin due to renal function. Patient was set up with paramedicine through CHF clinic to assist with medication compliance and follow-up was arranged. However, dose not look like she has been seen since discharge. She has had several paramedicine visit and sounds like patient is only simi compliant with medications. At last paramedicine check on 11/13/2019, lungs were clear and no edema noted on exam.  Patient presented to the ED today via EMS for shortness of breath. Patient reports she was in her usual state of health until yesterday when she developed significant shortness of breath. She has chronic shortness of breath of with activity as well as some baseline orthopnea. However, yesterday breathing significantly worsened. She had worsening orthopnea and PND last night. No lower extremity edema. She states she has been following a low sodium diet and states she has actually been losing weight. She notes some left sided chest discomfort when she is significantly short of breath. She states she feels like she cannot take a full breath and that her breathing catches in her abdomen. She also reports abdominal pain which she states she has  had for several months but states it has also worsened recently. She has notes abdominal distension, early satiety, and occasional nausea but no vomiting. She has difficult regulating her bowels and has both  constipation and diarrhea. She has she has poor appetite due to early satiety. She has some nasal drainage and occasional dry cough but no recent fevers or known sick contact. No abnormal bleeding in urine or stools.   She became so short of breath last night that she became near syncopal and fell down in bed. Family became concerned and called 911. Upon EMS arrival, O2 sats reportedly 76% on home O2 and patient was lethargic. Patient place on NRB with improvement.  In the ED, O2 sats 100% on NRB. Patient mildly tachypneic and mildly hypertensive. EKG showed normal sinus rhythm, rate 94 bpm, T wave inversions in V4-V6 and some other non-specific ST/T changes. No significant changes from prior tracings. High-sensitivity troponin minimally elevated at 21. BNP elevated at 863.1 (1,420 during last admission). Chest x-ray showed features of pulmonary edema with bilateral effusion as well as more patchy and confluent infrahilar opacity could reflect developing alveolar edema or infection/consolidation. WBC 7.7, Hgb 8.4, Plts 306. Na 137, K 3.7, Glucose 180, BUN 61, Cr 1.95. Lactic acid normal. COVID-19 testing negative. Patient admitted with acute hypoxic respiratory failure secondary to acute combined CHF. Started on IV Lasix '80mg'$  twice daily.  At the time of this evaluation, patient resting comfortably in bed. O2 sats in high 90's on 10 L of nasal cannula. She states she has had good urinary response to Lasix so far and feels like breathing may be a little better.  Past Medical History:  Diagnosis Date  . Acute CHF (congestive heart failure) ([place]) 09/24/2018  . Acute on chronic combined systolic and diastolic CHF (congestive heart failure) ([place]) 11/27/2017  . Acute on chronic respiratory failure with hypoxia ([place]) 03/20/2018  . Acute renal failure superimposed on stage 3 chronic kidney disease ([place])   . Acute respiratory distress 11/27/2017  . Acute respiratory failure with hypoxia ([place]) 02/09/2019  . Acute  systolic CHF (congestive heart failure) ([place]) 05/19/2019  . AKI (acute kidney injury) ([place]) 11/27/2017  . Atelectasis   . Bulging lumbar disc 11/14/2017  . CAP (community acquired pneumonia) 10/31/2018  . Chest pain 06/17/2016  . CHF (congestive heart failure) ([place])   . Chronic diastolic CHF (congestive heart failure) ([place]) 07/20/2018  . Chronic respiratory failure ([place])   . CKD (chronic kidney disease), stage III 03/20/2018  . Controlled type 2 diabetes mellitus with hyperglycemia ([place]) 04/23/2016  . Controlled type 2 diabetes with neuropathy ([place]) 03/20/2018  . COVID-19 virus infection 07/20/2018  . CVA (cerebral vascular accident) ([place])   . Diabetes mellitus without complication ([place])   . Dyspnea   . Elevated troponin 04/18/2019  . Epigastric pain   . Gout 11/14/2017  . Hypercholesteremia   . Hyperlipidemia LDL goal <70 09/30/2016  . Hypertension   . Myocardial infarction ([place])   . Normochromic normocytic anemia 04/23/2016  . NSTEMI (non-ST elevated myocardial infarction) ([place]) 02/09/2019  . Pneumonia 11/01/2018  . Pneumonia due to COVID-19 virus 07/20/2018  . S/P thoracentesis   . SIRS (systemic inflammatory response syndrome) ([place]) 07/25/2018  . Spinal stenosis   . Stress-induced cardiomyopathy 05/19/2016  . Syncope 04/23/2016  . Type 2 diabetes mellitus with diabetic neuropathy, unspecified ([place]) 05/06/2016  . Vertigo 05/06/2016  . Vitamin D deficiency 04/02/2018    Past Surgical History:  Procedure Laterality Date  . BIOPSY  01/27/2019   Procedure: BIOPSY;  Surgeon: Otis Brace, MD;  Location: [facility];  Service: Gastroenterology;;  . BLADDER SURGERY    . CARDIAC CATHETERIZATION N/A 04/25/2016   Procedure: Left Heart Cath and Coronary Angiography;  Surgeon: Lorretta Harp, MD;  Location: [place] [facility];  Service: Cardiovascular;  Laterality: N/A;  . CARDIAC CATHETERIZATION  02/11/2019  . CESAREAN SECTION    . CHOLECYSTECTOMY    . ESOPHAGOGASTRODUODENOSCOPY (EGD) WITH  PROPOFOL N/A 01/27/2019   Procedure: ESOPHAGOGASTRODUODENOSCOPY (EGD) WITH PROPOFOL;  Surgeon: Otis Brace, MD;  Location: [facility];  Service: Gastroenterology;  Laterality: N/A;  . RIGHT HEART CATH N/A 09/30/2019   Procedure: RIGHT HEART CATH;  Surgeon: Jolaine Artist, MD;  Location: [place] [facility];  Service: Cardiovascular;  Laterality: N/A;  . RIGHT/LEFT HEART CATH AND CORONARY ANGIOGRAPHY N/A 02/11/2019   Procedure: RIGHT/LEFT HEART CATH AND CORONARY ANGIOGRAPHY;  Surgeon: Jolaine Artist, MD;  Location: [place] [facility];  Service: Cardiovascular;  Laterality: N/A;  . VIDEO BRONCHOSCOPY N/A 02/01/2019   Procedure: VIDEO BRONCHOSCOPY WITHOUT FLUORO;  Surgeon: Candee Furbish, MD;  Location: [facility];  Service: Endoscopy;  Laterality: N/A;     Home Medications:  Prior to Admission medications   Medication Sig Start Date End Date Taking? Authorizing Provider  acetaminophen (TYLENOL) 500 MG tablet Take 500-1,000 mg by mouth every 6 (six) hours as needed for mild pain or headache.   Yes [provider]  allopurinol (ZYLOPRIM) 100 MG tablet Take 2 tablets (200 mg total) by mouth at bedtime. 10/06/19  Yes Georgette Shell, MD  aspirin EC 81 MG tablet Take 81 mg by mouth in the morning.    Yes [provider]  atorvastatin (LIPITOR) 80 MG tablet Take 80 mg by mouth at bedtime.    Yes [provider]  DULoxetine (CYMBALTA) 30 MG capsule Take 1 capsule (30 mg total) by mouth daily. Patient taking differently: Take 30 mg by mouth at bedtime.  02/06/19  Yes Bonnell Public, MD  fluticasone (FLONASE) 50 MCG/ACT nasal spray Place 2 sprays into both nostrils daily.   Yes [provider]  gabapentin (NEURONTIN) 100 MG capsule TAKE 1 CAPSULE (100 MG TOTAL) BY MOUTH 2 (TWO) TIMES DAILY. Patient taking differently: Take 100 mg by mouth 2 (two) times daily.  10/17/19  Yes [name], MD  hydrALAZINE (APRESOLINE) 25 MG tablet Take 0.5  tablets (12.5 mg total) by mouth 3 (three) times daily. 10/06/19 10/05/20 Yes Georgette Shell, MD  insulin aspart (NOVOLOG) 100 UNIT/ML injection 0 to 12 units subcutaneously 3 times daily before meals as per sliding scale 10/24/19  Yes Newlin, Charlane Ferretti, MD  Insulin Glargine (BASAGLAR KWIKPEN) 100 UNIT/ML Inject 0.05 mLs (5 Units total) into the skin daily. 10/24/19  Yes Newlin, Charlane Ferretti, MD  lansoprazole (PREVACID) 15 MG capsule TAKE 1 CAPSULE (15 MG TOTAL) BY MOUTH DAILY. Patient taking differently: Take 15 mg by mouth daily before breakfast.  09/05/19  Yes Newlin, Enobong, MD  Multiple Vitamins-Minerals (MULTIVITAMIN WITH MINERALS) tablet Take 1 tablet by mouth daily with breakfast.    Yes [provider]  OXYGEN Inhale 4 L/min into the lungs continuous.   Yes [provider]  primidone (MYSOLINE) 50 MG tablet Take 1 tablet (50 mg total) by mouth in the morning and at bedtime. 08/19/19  Yes Tat, Eustace Quail, DO  SUPER B COMPLEX/C CAPS Take 1 capsule by mouth daily.    Yes [provider]  torsemide (DEMADEX) 20  MG tablet Take 2 tablets (40 mg total) by mouth 2 (two) times daily. 10/08/19 01/06/20 Yes Bensimhon, Shaune Pascal, MD  Vitamin D, Ergocalciferol, (DRISDOL) 1.25 MG (50000 UNIT) CAPS capsule Take 50,000 Units by mouth every Sunday.   Yes [provider]  Accu-Chek FastClix Lancets MISC USE AS DIRECTED TO TEST BLOOD SUGAR THREE TIMES DAILY 06/10/19   [name], MD  Blood Glucose Monitoring Suppl (ACCU-CHEK GUIDE) w/Device KIT 1 each by Does not apply route 3 (three) times daily. Patient not taking: Reported on 11/06/2019 06/10/19   [name], MD  Continuous Blood Gluc Receiver (DEXCOM G6 RECEIVER) DEVI Use 1 each as directed Patient not taking: Reported on 11/06/2019 10/25/19   [name], MD  Continuous Blood Gluc Sensor (DEXCOM G6 SENSOR) MISC Use 1 each as directed Patient not taking: Reported on 11/06/2019 10/25/19   [name], MD  Continuous Blood Gluc  Transmit (DEXCOM G6 TRANSMITTER) MISC Use 1 each as directed Patient not taking: Reported on 11/06/2019 10/25/19   [name], MD  EASY COMFORT PEN NEEDLES 31G X 5 MM MISC USE FOUR TIMES PER DAY FOR INSULIN ADMINISTRATION 10/02/18   [name], MD  glucose blood (ACCU-CHEK GUIDE) test strip USE AS DIRECTED TO TEST BLOOD SUGAR THREE TIMES DAILY 06/10/19   [name], MD  Lancet Device MISC Use as instructed 3 times daily 04/26/19   [name], MD  Misc. Devices MISC Portable oxygen concentrator.  Diagnosis-chronic respiratory failure. 09/03/18   [name], MD  Misc. Devices MISC [place] with seat. Dx: Congestive Heart Failure 10/19/18   [name], MD  Misc. Devices MISC Scale; Dx - CHF 06/10/19   [name], MD  Misc. [place] Hospital bed.  Diagnosis CHF.  Lifetime use.  Weight 165 lbs. 07/10/19   [name], MD  Misc. Devices [place] chair DX CHF 10/10/19   [name], MD  Misc. Devices MISC Manual wheelchai with cushion, anti - tippers, brakes and lock.  Diagnosis congestive heart failure.  Weight 140 pounds. 10/14/19   [name], MD  polyethylene glycol (MIRALAX / GLYCOLAX) 17 g packet Take 17 g by mouth daily as needed for moderate constipation. Patient not taking: Reported on 10/08/2019 05/22/19   Geradine Girt, DO    Inpatient Medications: Scheduled Meds: . [START ON 11/20/2019] aspirin EC  81 mg Oral q AM  . atorvastatin  80 mg Oral QHS  . DULoxetine  30 mg Oral QHS  . fluticasone  2 spray Each Nare Daily  . furosemide  80 mg Intravenous BID  . gabapentin  100 mg Oral BID  . heparin  5,000 Units Subcutaneous Q8H  . hydrALAZINE  12.5 mg Oral TID  . insulin aspart  0-9 Units Subcutaneous TID WC  . [START ON 11/20/2019] insulin glargine  5 Units Subcutaneous Daily  . pantoprazole  20 mg Oral Daily  . primidone  50 mg Oral BID  . sodium chloride flush  3 mL Intravenous Q12H   Continuous Infusions: . sodium chloride     PRN Meds: sodium  chloride, acetaminophen, ondansetron (ZOFRAN) IV, sodium chloride flush  Allergies:    Allergies  Allergen Reactions  . Garlic Shortness Of Breath, Itching, Swelling and Other (See Comments)    "Raw garlic" = Hand itching and swelling  . Isosorbide Other (See Comments)    Sneezing and runny nose  . Latex Itching  . Morphine And Related Itching and Other (See Comments)    Headache   . Other Itching  and Other (See Comments)    Reaction to newspaper ink- Headaches, also    Social History:   Social History   Socioeconomic History  . Marital status: Widowed    Spouse name: Not on file  . Number of children: Not on file  . Years of education: Not on file  . Highest education level: Not on file  Occupational History  . Not on file  Tobacco Use  . Smoking status: Never Smoker  . Smokeless tobacco: Never Used  Vaping Use  . Vaping Use: Never used  Substance and Sexual Activity  . Alcohol use: No  . Drug use: No  . Sexual activity: Not Currently    Birth control/protection: None  Other Topics Concern  . Not on file  Social History Narrative  . Not on file   Social Determinants of Health   Financial Resource Strain:   . Difficulty of Paying Living Expenses:   Food Insecurity:   . Worried About Charity fundraiser in the Last Year:   . Arboriculturist in the Last Year:   Transportation Needs:   . Film/video editor (Medical):   Marland Kitchen Lack of Transportation (Non-Medical):   Physical Activity:   . Days of Exercise per Week:   . Minutes of Exercise per Session:   Stress:   . Feeling of Stress :   Social Connections:   . Frequency of Communication with Friends and Family:   . Frequency of Social Gatherings with Friends and Family:   . Attends Religious Services:   . Active Member of Clubs or Organizations:   . Attends Archivist Meetings:   Marland Kitchen Marital Status:   Intimate Partner Violence:   . Fear of Current or Ex-Partner:   . Emotionally Abused:   Marland Kitchen  Physically Abused:   . Sexually Abused:     Family History:    Family History  Problem Relation Age of Onset  . Diabetes Mellitus II Father   . Stroke Father   . Healthy Mother        She is 63 years old.      ROS:  Please see the history of present illness.  All other ROS reviewed and negative.     Physical Exam/Data:   Vitals:   11/19/19 1306 11/19/19 1406 11/19/19 1410 11/19/19 1412  BP:  (!) 140/91    Pulse:      Resp:   (!) 25   Temp:      TempSrc:      SpO2: 100%   100%    Intake/Output Summary (Last 24 hours) at 11/19/2019 1517 Last data filed at 11/19/2019 1149 Gross per 24 hour  Intake --  Output 180 ml  Net -180 ml   Last 3 Weights 11/13/2019 11/06/2019 10/24/2019  Weight (lbs) 139 lb 12.8 oz 139 lb 142 lb  Weight (kg) 63.413 kg 63.05 kg 64.411 kg     There is no height or weight on file to calculate BMI.  General: 64 y.o. female resting comfortably in no acute distress. HEENT: Normocephalic and atraumatic. Sclera clear.  Neck: Supple. No carotid bruits. No JVD. Heart: RRR. Distinct S1 and S2. No murmurs, gallops, or rubs. Radial and distal pedal pulses 2+ and equal bilaterally. Lungs: Mild increased work of breathing. On 10 L of O2 via nasal cannula. Decreased breath sounds bilaterally with bibasilar crackles. Abdomen: Soft, non-distended, and non-tender to palpation. Bowel sounds present. Extremities: No lower extremity edema.    Skin:  Warm and dry. Neuro: No focal deficits. Psych: Normal affect. Responds appropriately.   EKG:  The EKG was personally reviewed and demonstrates: Normal sinus rhythm, rate 94 bpm, T wave inversions in V4-V6 and some other non-specific ST/T changes. No significant changes from prior tracings.   Telemetry:  Telemetry was personally reviewed and demonstrates:  Normal sinus rhythm with rates in the 80's to 90's.  Relevant CV Studies:  Right/Left Heart Catheterization 02/11/2019:  Prox RCA lesion is 30% stenosed.  Prox Cx  lesion is 40% stenosed with 60% stenosed side branch in 1st Mrg.  Mid LAD lesion is 30% stenosed.  Dist LAD lesion is 30% stenosed.   Findings: Ao = 131/59 (85) LV =  133/22 RA = 12 RV = 49/13 PA =  48/21 (33) PCW = 26 (v = 33) Fick cardiac output/index = 6.4/3.4 PVR =1.3 WU FA sat = 98 % PA sat = 62%, 63% RA/PCW = 0.46  Assessment: 1. Mild non-obstructive CAD with small coronary arteries 2. Moderate to severe NICM with EF 30% by echo 3. No significant aortic valve gradient on pullback  4. Elevated filling pressures with normal cardiac output  Plan/Discussion: Medical therapy. _______________  Echocardiogram 09/27/2019: Impressions: 1. Left ventricular ejection fraction, by estimation, is 20 to 25%. The  left ventricle has severely decreased function. The left ventricle  demonstrates global hypokinesis. The left ventricular internal cavity size  was mildly dilated. There is mild  asymmetric left ventricular hypertrophy of the inferolateral segment. Left  ventricular diastolic parameters are consistent with Grade III diastolic  dysfunction (restrictive). Elevated left ventricular end-diastolic  pressure.  2. Right ventricular systolic function is normal. The right ventricular  size is normal. There is mildly elevated pulmonary artery systolic  pressure. The estimated right ventricular systolic pressure is 82.8 mmHg.  3. Left atrial size was moderately dilated.  4. The mitral valve is grossly normal. Mild mitral valve regurgitation  due to LV dysfunction with leaflet tenting.  5. Tricuspid valve regurgitation is moderate.  6. The aortic valve is abnormal. AV gradient may be underestimated due to  LV dysfunction.  7. The inferior vena cava is normal in size with <50% respiratory  variability, suggesting right atrial pressure of 8 mmHg.  _______________  Right Heart Catheterization 09/30/2019: Findings: RA = 13 RV = 63/16 PA =  62/23 (40) PCW = 27  (v=43) Fick cardiac output/index = 4.8/2.9 Thermo CO/CI = 2.3/1.4 PVR = 5.7 WU (Thermo) Ao sat = 95% PA sat = 54%, 56%  Assessment: 1. Markedly elevated biventricular filling pressures 2. Marked discrepancy between Fick and Thermo cardiac outputs; clinical picture most c/w Thermo numbers  Plan/Discussion: Start IV milrinone. Continue high-dose IV lasix.  Plan cMRI to further evaluate cause of worsening cardiomyopathy.   Laboratory Data:  High Sensitivity Troponin:   Recent Labs  Lab 11/19/19 0510  TROPONINIHS 21*     Chemistry Recent Labs  Lab 11/19/19 0510 11/19/19 0958  NA 137 137  K 3.7 3.9  CL 101  --   CO2 26  --   GLUCOSE 180*  --   BUN 61*  --   CREATININE 1.95*  --   CALCIUM 9.6  --   GFRNONAA 27*  --   GFRAA 31*  --   ANIONGAP 10  --     No results for input(s): PROT, ALBUMIN, AST, ALT, ALKPHOS, BILITOT in the last 168 hours. Hematology Recent Labs  Lab 11/19/19 0510 11/19/19 0958  WBC 7.7  --  RBC 2.92*  --   HGB 8.4* 8.2*  HCT 27.4* 24.0*  MCV 93.8  --   MCH 28.8  --   MCHC 30.7  --   RDW 16.9*  --   PLT 306  --    BNP Recent Labs  Lab 11/19/19 0510  BNP 863.1*    DDimer No results for input(s): DDIMER in the last 168 hours.   Radiology/Studies:  DG Chest Portable 1 View  Result Date: 11/19/2019 CLINICAL DATA:  Shortness of breath, lethargic EXAM: PORTABLE CHEST 1 VIEW COMPARISON:  Radiograph 09/26/2019 FINDINGS: Central vascular congestion with diffusely increased hazy interstitial opacities and more patchy and confluent infrahilar opacity partially silhouetting the lung bases likely with bilateral effusions. Fissural and septal thickening is noted as well. No pneumothorax. Mild cardiomegaly similar to comparison imaging accounting for differences in technique. The aorta is calcified. The remaining cardiomediastinal contours are unremarkable. No acute osseous or soft tissue abnormality. Degenerative changes are present in the imaged  spine and shoulders. Telemetry leads overlie the chest. IMPRESSION: Features of pulmonary edema with bilateral effusions. More patchy and confluent infrahilar opacity could reflect developing alveolar edema or infection/consolidation. Cardiomegaly similar to priors. Aortic Atherosclerosis (ICD10-I70.0). Electronically Signed   By: Lovena Le M.D.   On: 11/19/2019 05:07   New York Heart Association (NYHA) Functional Class NYHA Class III  Assessment and Plan:   Acute on Chronic Combined CHF / Non-Ischemic Cardiomyopathy - Patient has had multiple admissions over the last year for acute on chronic CHF and has history of poor medication compliance. Work-up for cardiomyopathy has shown normal coronaries and no evidence of amyloidosis. During last admission, she required Milrinone. - Patient presented with sudden onset of worsening shortness of breath and hypoxia.  - BNP elevated in the 800's.  - Chest x-ray showed features of pulmonary edema and pleural effusions. - Most recent Echo in 09/2019 showed 20-25% with global hypokinesis and grade 3 diastolic CHF. Elevated RVSP of 41.9 mmHg.  - Diminished breath sounds with bibasilar crackles noted on exam but no significant peripheral edema. - Started on IV Lasix 65m twice daily. Continue for now. - Continue home Hydralazine 12.545mTID. Has not been able to tolerate Imdur. - Previously on Coreg during last admission. Will not start now given currently in acute exacerbation. - No ACEI/ARB/ARNI, Spironolactone, or Digoxin due to renal function. - Continue to monitor daily weight, strict I/O's, and renal function. - Per Dr. BeClayborne Danaote during last hospitalization, patient has very advanced heart failure and felt to be high risk for recurrent decompensation off Milrinone. Palliative care was consulted during last admission so may be helpful to have them involved again. Do not feel she needs Milrinone at this time. BP mildly elevated. She is warm and has  good distal pulses.   Acute on Chronic Hypoxic Respiratory Failure - Likely due to CHF exacerbation as above. - O2 sats in mid 70's upon EMS arrival. Improved with NRB. Now on 10L via nasal cannula. - Continue to wean O2 as able. On 4L of O2 at home. - Management for CHF as above.  Abdominal Pain - Patient has had abdominal pain for several months. Also reports abdominal distension and early satiety so may be from CHF.  - Abdomina/Pelvic CT showed uncomplicated diverticulitis by nothing to explain pain. - LFT normal back in 09/2019. Can recheck this as well as lipase. However, this looks like it may be a chronic issue. - Management per primary team.  Hypertension - BP mildly  elevated at times.  - Continue home Hydralazine.  - Continue diuresis as above.  Hyperlipidemia  - Continue home Lipitor 34m daily.   Type 2 Diabetes Mellitus - Management per primary team.  CKD Stage IV - Creatinine 1.95 on admission. Baseline around 1.8 to 2.3.  - Continue to monitor closely.   Chronic Normocytic Anemia - Hemoglobin 8.4 on admission. Baseline around 8-9.  - Continue to monitor.    For questions or updates, please contact [place] [email or web address] for contact info under    Signed, CDarreld Mclean PA-C  11/19/2019 3:17 PM  Personally seen and examined. Agree with above.   64year old female with end-stage chronic systolic heart failure followed by para medicine program, previously seen by Dr. BHaroldine Lawsof advanced heart failure team, here with worsening shortness of breath, abdominal tenderness.  Currently she is laying fairly comfortably in bed but she still complains of upper abdominal discomfort.  No melena no syncope no bleeding.  CT scan of abdomen was unremarkable.  GEN: Well nourished, well developed, in minimal distress  HEENT: normal  Neck: no JVD, carotid bruits, or masses Cardiac: RRR; no murmurs, rubs, or gallops,no edema  Respiratory:  Decreased breath sounds at bases, normal work of breathing GI: soft, mildly tender, nondistended, + BS MS: no deformity or atrophy  Skin: warm and dry, no rash Neuro:  Alert and Oriented x 3, Strength and sensation are intact Psych: euthymic mood, full affect  Creatinine 1.95 BNP 863 troponin 21 hemoglobin 8.2 Chest x-ray personally reviewed shows pulmonary edema with small pleural effusions, cardiomegaly  Prior right and left heart catheterization reviewed.  See above for details.  Assessment and plan:  Acute on chronic systolic heart failure, end-stage heart failure -Agree with IV Lasix 80 mg twice a day.  Seems to be diuresing fairly well. -I believe that her abdominal fullness, tenderness could certainly be where she is holding onto her fluid causing distention/early satiety secondary to her low output heart failure. -Extensive review of previous notes from advanced heart failure team showed that she certainly was in end-stage heart failure.  Palliative care should be once again part of the discussion during this hospitalization. Unable to utilize traditional goal-directed therapy secondary to renal dysfunction.  Highly complex medical issues as portrayed above. She is at high risk for readmission, high morbidity/mortality.  MCandee Furbish MD

## 2019-11-19 NOTE — ED Notes (Signed)
Dinner ordered 

## 2019-11-19 NOTE — ED Triage Notes (Addendum)
Pt transported from home by EMS with c/o shob, 76% on home 02, pt lethargic, #18 L AC, NRB on arrival, diminished lower lung sounds

## 2019-11-19 NOTE — Progress Notes (Signed)
Evaluated Pt for respiratory distress.  WOB normal at this time.  Pt placed on [place] HFNC @ 8L.  Pt now resting with O2 sats at 100%.  Will have BiPAP on standby if distress occurs.

## 2019-11-19 NOTE — Telephone Encounter (Signed)
Number 6 question has been answered and paperwork has been re faxed to company.

## 2019-11-19 NOTE — Progress Notes (Signed)
CT scan once obtained showed uncomplicated diverticulitis.  Patient was placed on empiric antibiotics of Rocephin and metronidazole IV.

## 2019-11-19 NOTE — ED Notes (Signed)
Called ED pharm to re time heparin

## 2019-11-19 NOTE — ED Notes (Signed)
Informed admitting of output and bladder scan results 41 ml

## 2019-11-20 ENCOUNTER — Inpatient Hospital Stay (HOSPITAL_COMMUNITY): Payer: Medicare Other

## 2019-11-20 LAB — BASIC METABOLIC PANEL
Anion gap: 10 (ref 5–15)
BUN: 63 mg/dL — ABNORMAL HIGH (ref 8–23)
CO2: 24 mmol/L (ref 22–32)
Calcium: 9.4 mg/dL (ref 8.9–10.3)
Chloride: 105 mmol/L (ref 98–111)
Creatinine, Ser: 1.92 mg/dL — ABNORMAL HIGH (ref 0.44–1.00)
GFR calc Af Amer: 31 mL/min — ABNORMAL LOW (ref >60)
GFR calc non Af Amer: 27 mL/min — ABNORMAL LOW (ref >60)
Glucose, Bld: 138 mg/dL — ABNORMAL HIGH (ref 70–99)
Potassium: 3.9 mmol/L (ref 3.5–5.1)
Sodium: 139 mmol/L (ref 135–145)

## 2019-11-20 LAB — GLUCOSE, CAPILLARY
Glucose-Capillary: 129 mg/dL — ABNORMAL HIGH (ref 70–99)
Glucose-Capillary: 154 mg/dL — ABNORMAL HIGH (ref 70–99)
Glucose-Capillary: 88 mg/dL (ref 70–99)
Glucose-Capillary: 124 mg/dL — ABNORMAL HIGH (ref 70–99)

## 2019-11-20 NOTE — Evaluation (Signed)
Physical Therapy Evaluation Patient Details Name: Kara Hanson MRN: [id number] DOB: Dec 23, 1955 Today's Date: 11/20/2019   History of Present Illness  64 y.o. female with a history of mild CAD on cath in 02/2019, chronic combined CHF with LVEF of 20-25% on Echo in 09/2019, chronic respiratory failure on 4L of O2 at home, prior CVA, hypertension, hyperlipidemia, type 2 diabetes on insulin, CKD stage IV, chronic anemia, and COVID pneumonia in 07/2018. Brought to ED by EMS and was satting 76%O2 at home on 4L O2 improved on nonrebreather. In ED weaned to 6L via [place]. Chest x-ray showed cardiomegaly with pulmonary edema and bilateral effusions. Admitted 11/19/19 for treatment of CHF.  Clinical Impression  Pt known to therapist from [place] admission. PTA pt living with daughter in single story home with level entry. Pt reports ambulation with RW and minor assist with ADL/iADLs. Pt is limited in safe mobility today by stomachache and diarrhea, as well as generalized weakness, decreased balance and endurance. Pt is currently min A for bed mobility, transfers and ambulation of 3 feet with HHA. PT is recommending HHPT level rehab at discharge to improve mobility in her home environment. PT will continue to follow acutely.      Follow Up Recommendations Home health PT;Supervision/Assistance - 24 hour    Equipment Recommendations  None recommended by PT (already has RW)    Recommendations for Other Services OT consult     Precautions / Restrictions Precautions Precautions: None Restrictions Weight Bearing Restrictions: No      Mobility  Bed Mobility Overal bed mobility: Needs Assistance Bed Mobility: Supine to Sit     Supine to sit: Min assist;HOB elevated     General bed mobility comments: Pt able to bring LE off bed however needs assist to come to fully upright   Transfers Overall transfer level: Needs assistance Equipment used: 1 person hand held assist Transfers: Sit to/from Stand Sit to  Stand: Min assist         General transfer comment: min face to face A for power up and steadying   Ambulation/Gait Ambulation/Gait assistance: Min assist Gait Distance (Feet): 3 Feet Assistive device: 1 person hand held assist Gait Pattern/deviations: Step-through pattern;Decreased step length - right;Decreased step length - left;Shuffle;Antalgic Gait velocity: slowed Gait velocity interpretation: <1.31 ft/sec, indicative of household ambulator General Gait Details: minA for steadying with stepping to recliner. Further ambulation deferred due in stomach aching          Balance Overall balance assessment: Needs assistance Sitting-balance support: Feet supported;Bilateral upper extremity supported Sitting balance-Leahy Scale: Poor     Standing balance support: Bilateral upper extremity supported Standing balance-Leahy Scale: Poor                               Pertinent Vitals/Pain Pain Assessment: Faces Faces Pain Scale: Hurts even more Pain Location: stomach upset Pain Descriptors / Indicators: Aching;Cramping Pain Intervention(s): Limited activity within patient's tolerance;Monitored during session;Repositioned    Home Living Family/patient expects to be discharged to:: Private residence Living Arrangements: Children Available Help at Discharge: Family;Available 24 hours/day;Personal care attendant (HHA has not started yet) Type of Home: House Home Access: Level entry     Home Layout: One level Home Equipment: Walker - 2 wheels;Tub bench      Prior Function Level of Independence: Needs assistance   Gait / Transfers Assistance Needed: uses RW at home needs a scooter at the store  ADL's / [facility]  Needed: requires min A in ADL/iADLs  Comments: Uses 4L of O2 via [place] at home      Hand Dominance   Dominant Hand: Right    Extremity/Trunk Assessment   Upper Extremity Assessment Upper Extremity Assessment: Generalized weakness     Lower Extremity Assessment Lower Extremity Assessment: Generalized weakness       Communication   Communication: No difficulties  Cognition Arousal/Alertness: Lethargic Behavior During Therapy: WFL for tasks assessed/performed;Flat affect Overall Cognitive Status: Within Functional Limits for tasks assessed                                 General Comments: Pt drowsy during Evaluation       General Comments General comments (skin integrity, edema, etc.): Pt on 7L O2 via HFNC on entry with SaO2 96%O2, lost pleth wave form during transfer, once sitting instructed in purse lip breathing and when pleth waveform returned HR in 80s         Assessment/Plan    PT Assessment Patient needs continued PT services  PT Problem List Decreased strength;Decreased activity tolerance;Decreased balance;Decreased mobility;Pain       PT Treatment Interventions DME instruction;Gait training;Functional mobility training;Therapeutic activities;Therapeutic exercise;Balance training;Patient/family education    PT Goals (Current goals can be found in the Care Plan section)  Acute Rehab PT Goals Patient Stated Goal: feel better PT Goal Formulation: With patient Time For Goal Achievement: 12/04/19 Potential to Achieve Goals: Good    Frequency Min 3X/week    AM-PAC PT "6 Clicks" Mobility  Outcome Measure Help needed turning from your back to your side while in a flat bed without using bedrails?: A Little Help needed moving from lying on your back to sitting on the side of a flat bed without using bedrails?: A Lot Help needed moving to and from a bed to a chair (including a wheelchair)?: A Little Help needed standing up from a chair using your arms (e.g., wheelchair or bedside chair)?: A Little Help needed to walk in hospital room?: A Lot Help needed climbing 3-5 steps with a railing? : A Lot 6 Click Score: 15    End of Session Equipment Utilized During Treatment: Gait  belt Activity Tolerance: Patient limited by pain;Patient limited by fatigue Patient left: in chair;with call bell/phone within reach;with chair alarm set;Other (comment) (MD in room ) Nurse Communication: Mobility status PT Visit Diagnosis: Unsteadiness on feet (R26.81);Other abnormalities of gait and mobility (R26.89);Muscle weakness (generalized) (M62.81);Difficulty in walking, not elsewhere classified (R26.2);Pain Pain - part of body:  (stomach)    Time: 4650-3546 PT Time Calculation (min) (ACUTE ONLY): 18 min   Charges:   PT Evaluation $PT Eval Moderate Complexity: 1 Mod          Angie Hogg B. Migdalia Dk PT, DPT Acute Rehabilitation Services Pager [phone] Office [phone]   [place] 11/20/2019, 10:20 AM

## 2019-11-20 NOTE — Progress Notes (Signed)
Progress Note  Patient Name: Kara Hanson Date of Encounter: 11/20/2019  [facility] Cardiologist: Candee Furbish, MD   Subjective   Laying fairly flat in bed, mildly increased work of breathing.  Still having epigastric upper abdominal discomfort.  Moderate.  Inpatient Medications    Scheduled Meds: . aspirin EC  81 mg Oral q AM  . atorvastatin  80 mg Oral QHS  . DULoxetine  30 mg Oral QHS  . fluticasone  2 spray Each Nare Daily  . furosemide  80 mg Intravenous BID  . gabapentin  100 mg Oral BID  . heparin  5,000 Units Subcutaneous Q8H  . hydrALAZINE  12.5 mg Oral TID  . insulin aspart  0-9 Units Subcutaneous TID WC  . insulin glargine  5 Units Subcutaneous Daily  . pantoprazole  20 mg Oral Daily  . primidone  50 mg Oral BID  . sodium chloride flush  3 mL Intravenous Q12H   Continuous Infusions: . sodium chloride    . cefTRIAXone (ROCEPHIN)  IV Stopped (11/20/19 0210)  . metronidazole 500 mg (11/20/19 0839)   PRN Meds: sodium chloride, acetaminophen, ondansetron (ZOFRAN) IV, sodium chloride flush   Vital Signs    Vitals:   11/19/19 2119 11/19/19 2333 11/20/19 0521 11/20/19 0812  BP: (!) 151/82 (!) 142/93 (!) 151/77 127/78  Pulse: 93 100 93 81  Resp: (!) 33 20 20 20   Temp: 97.7 F (36.5 C) 98.1 F (36.7 C) 97.9 F (36.6 C) (!) 97.4 F (36.3 C)  TempSrc: Oral Oral Oral Oral  SpO2: 95% 97% 94% 100%  Weight:  64.1 kg    Height:  5' (1.524 m)      Intake/Output Summary (Last 24 hours) at 11/20/2019 1022 Last data filed at 11/20/2019 0033 Gross per 24 hour  Intake 120 ml  Output 180 ml  Net -60 ml   Last 3 Weights 11/19/2019 11/13/2019 11/06/2019  Weight (lbs) 141 lb 4.8 oz 139 lb 12.8 oz 139 lb  Weight (kg) 64.093 kg 63.413 kg 63.05 kg      Telemetry    Sinus rhythm no adverse arrhythmias- Personally Reviewed  ECG    No new- Personally Reviewed  Physical Exam   GEN: No acute distress.   Neck: No JVD Cardiac: RRR, no murmurs, rubs, or gallops.    Respiratory: Clear to auscultation bilaterally. GI: Soft, mildly tender, mildly distended  MS: No edema; No deformity. Neuro:  Nonfocal  Psych: Normal affect   Labs    High Sensitivity Troponin:   Recent Labs  Lab 11/19/19 0510  TROPONINIHS 21*      Chemistry Recent Labs  Lab 11/19/19 0510 11/19/19 0958 11/19/19 2217 11/20/19 0644  NA 137 137  --  139  K 3.7 3.9  --  3.9  CL 101  --   --  105  CO2 26  --   --  24  GLUCOSE 180*  --   --  138*  BUN 61*  --   --  63*  CREATININE 1.95*  --   --  1.92*  CALCIUM 9.6  --   --  9.4  PROT  --   --  6.1*  --   ALBUMIN  --   --  2.7*  --   AST  --   --  12*  --   ALT  --   --  12  --   ALKPHOS  --   --  80  --   BILITOT  --   --  0.4  --   GFRNONAA 27*  --   --  27*  GFRAA 31*  --   --  31*  ANIONGAP 10  --   --  10     Hematology Recent Labs  Lab 11/19/19 0510 11/19/19 0958  WBC 7.7  --   RBC 2.92*  --   HGB 8.4* 8.2*  HCT 27.4* 24.0*  MCV 93.8  --   MCH 28.8  --   MCHC 30.7  --   RDW 16.9*  --   PLT 306  --     BNP Recent Labs  Lab 11/19/19 0510  BNP 863.1*     DDimer No results for input(s): DDIMER in the last 168 hours.   Radiology    CT ABDOMEN PELVIS WO CONTRAST  Result Date: 11/19/2019 CLINICAL DATA:  Abdominal distension EXAM: CT ABDOMEN AND PELVIS WITHOUT CONTRAST TECHNIQUE: Multidetector CT imaging of the abdomen and pelvis was performed following the standard protocol without IV contrast. COMPARISON:  01/24/2019 FINDINGS: Lower chest: Moderate bilateral pleural effusions are noted, with dense areas of consolidation at the lung bases and bilateral air bronchograms, without significant change since prior CT. Heart is enlarged without pericardial effusion. Hepatobiliary: No focal liver abnormality is seen. Status post cholecystectomy. No biliary dilatation. Pancreas: Unremarkable. No pancreatic ductal dilatation or surrounding inflammatory changes. Spleen: Normal in size without focal abnormality.  Adrenals/Urinary Tract: No urinary tract calculi or obstructive uropathy. Bladder is unremarkable with no filling defects. Adrenals are normal. Stomach/Bowel: No bowel obstruction or ileus. Normal appendix right lower quadrant. There is diverticulosis of the descending colon, with focal areas of inflammatory change along the anteromedial aspect proximal descending colon, reference images 47-50/2, consistent with focal acute uncomplicated diverticulitis. Vascular/Lymphatic: Aortic atherosclerosis. No enlarged abdominal or pelvic lymph nodes. Reproductive: Stable uterine fibroids. No adnexal masses. Other: No free fluid or free gas within the abdomen. Fat containing midline ventral hernia unchanged. No bowel herniation. Musculoskeletal: No acute or destructive bony lesions. Reconstructed images demonstrate no additional findings. IMPRESSION: 1. Focal acute uncomplicated diverticulitis of the proximal descending colon. 2. Moderate bilateral pleural effusions with dense areas of consolidation at the lung bases and bilateral air bronchograms, without significant change since prior CT. 3. Aortic Atherosclerosis (ICD10-I70.0). Electronically Signed   By: Randa Ngo M.D.   On: 11/19/2019 16:15   DG Abd 1 View  Result Date: 11/20/2019 CLINICAL DATA:  Abdominal pain with diarrhea EXAM: ABDOMEN - 1 VIEW COMPARISON:  CT abdomen and pelvis November 19, 2019 FINDINGS: There is moderate stool in the colon. There is no bowel dilatation or air-fluid level to suggest bowel obstruction. No free air. There are surgical clips in the gallbladder fossa region. There are small apparent phleboliths in the pelvis. IMPRESSION: No bowel obstruction or free air evident.  Moderate stool in colon. Electronically Signed   By: Lowella Grip III M.D.   On: 11/20/2019 09:57   DG Chest Portable 1 View  Result Date: 11/19/2019 CLINICAL DATA:  Shortness of breath, lethargic EXAM: PORTABLE CHEST 1 VIEW COMPARISON:  Radiograph 09/26/2019  FINDINGS: Central vascular congestion with diffusely increased hazy interstitial opacities and more patchy and confluent infrahilar opacity partially silhouetting the lung bases likely with bilateral effusions. Fissural and septal thickening is noted as well. No pneumothorax. Mild cardiomegaly similar to comparison imaging accounting for differences in technique. The aorta is calcified. The remaining cardiomediastinal contours are unremarkable. No acute osseous or soft tissue abnormality. Degenerative changes are present in the imaged spine and shoulders.  Telemetry leads overlie the chest. IMPRESSION: Features of pulmonary edema with bilateral effusions. More patchy and confluent infrahilar opacity could reflect developing alveolar edema or infection/consolidation. Cardiomegaly similar to priors. Aortic Atherosclerosis (ICD10-I70.0). Electronically Signed   By: Lovena Le M.D.   On: 11/19/2019 05:07    Cardiac Studies   Right Heart Catheterization 09/30/2019: Findings: RA = 13 RV = 63/16 PA = 62/23 (40) PCW = 27 (v=43) Fick cardiac output/index = 4.8/2.9 Thermo CO/CI = 2.3/1.4 PVR = 5.7 WU (Thermo) Ao sat = 95% PA sat = 54%, 56%  Assessment: 1. Markedly elevated biventricular filling pressures  Echocardiogram 09/27/2019: Impressions: 1. Left ventricular ejection fraction, by estimation, is 20 to 25%. The  left ventricle has severely decreased function. The left ventricle  demonstrates global hypokinesis. The left ventricular internal cavity size  was mildly dilated. There is mild  asymmetric left ventricular hypertrophy of the inferolateral segment. Left  ventricular diastolic parameters are consistent with Grade III diastolic  dysfunction (restrictive). Elevated left ventricular end-diastolic  pressure.  2. Right ventricular systolic function is normal. The right ventricular  size is normal. There is mildly elevated pulmonary artery systolic  pressure. The estimated right  ventricular systolic pressure is 75.1 mmHg.  3. Left atrial size was moderately dilated.  4. The mitral valve is grossly normal. Mild mitral valve regurgitation  due to LV dysfunction with leaflet tenting.  5. Tricuspid valve regurgitation is moderate.  6. The aortic valve is abnormal. AV gradient may be underestimated due to  LV dysfunction.  7. The inferior vena cava is normal in size with <50% respiratory  variability, suggesting right atrial pressure of 8 mmHg.   Patient Profile     64 y.o. female with a history of mild CAD on cath in 02/2019, chronic combined CHF with LVEF of 20-25% on Echo in 09/2019, chronic respiratory failure on 4L of O2 at home, prior CVA, hypertension, hyperlipidemia, type 2 diabetes on insulin, CKD stage IV, chronic anemia, and COVID pneumonia in 07/2018 who is being seen for CHF   Assessment & Plan    Acute on chronic combined systolic/diastolic heart failure, nonischemic cardiomyopathy, biventricular failure-CKD 4 -She has had complete work-ups previously right and left heart cath with advanced heart failure team.  Previous hospitalization was on milrinone IV which was eventually weaned.  Discussions with palliative care team ensued. -Continue with IV Lasix 80 mg IV twice daily. -Continue with hydralazine 12.5 3 times daily.  She has not been able to tolerate other medications. -We will hopefully be able to restart carvedilol soon.  Abdominal pain -Upon further review of CT scan, potential inflammatory changes suggestive of proximal descending diverticulitis noted.  She is being treated with antibiotics.      For questions or updates, please contact [place] Please consult [email or web address] for contact info under        Signed, Candee Furbish, MD  11/20/2019, 10:22 AM

## 2019-11-20 NOTE — Progress Notes (Signed)
PROGRESS NOTE    Kara Hanson  YTK:[id number] DOB: Sep 14, 1955 DOA: 11/19/2019 PCP: [name], MD   Brief Narrative:  HPI On 11/19/2019 by Dr. Fuller Plan [name] is a 64 y.o. female with medical history significant of hypertension, systolic congestive heart failure (last EF 20-25%), insulin-dependent diabetes mellitus, chronic hypoxic respiratory failure on 4 L of nasal cannula oxygen, pneumonia due to COVID-19/2020, chronic kidney disease stage III presents with complaints of being unable woken up out of her sleep unable to breathe. Reported associated symptoms of chest tightness, epigastric abdominal discomfort, abdominal distention, and nausea. She has been compliant with all of her home medications. Denies any fever, chills, cough, weight gain, leg swelling, vomiting, dysuria, diarrhea, or constipation. She reports that is hard to take deep inspiratory breath. Her last bowel movement was yesterday.  EMS noted that the patient was satting 76% on home oxygen of 4 L and improved after being placed on a nonrebreather.  Interim history Admitted with acute hypoxic respiratory failure secondary to CHF exacerbation.  Cardiology consulted, currently on IV Lasix.  Assessment & Plan   Acute respiratory failure with hypoxia secondary to Acute combined systolic and diastolic CHF exacerbation -Patient currently requiring 6-7 L of oxygen (usually on 4 L at home) -Patient was noted to be hypoxic on admission -BNP 863.1; chest x-ray revealed pulmonary edema with bilateral effusions -Of note patient was recently hospitalized for CHF exacerbation back in June 2021 -Echocardiogram 09/27/2019 showed an EF of 5573%, grade 3 diastolic dysfunction -Patient recently had undergone right heart catheterization showing elevated biventricular filling pressures -Currently on IV Lasix 80 mg twice daily -Continue to monitor intake and output, daily weights -Cardiology consulted and appreciated     Abdominal pain with diarrhea -Continues to complain of abdominal pain and states she had "a lot of diarrhea today" -CT abdomen pelvis showed focal acute uncomplicated diverticulitis of the proximal descending colon, moderate bilateral pleural effusions with dense areas of consolidation at lung bases -C diff pending -Was placed on IV ceftriaxone and flagyl -Abdominal x-ray obtained today showing no obstruction or free air.  Moderate stool.  Diabetes mellitus type 2, uncontrolled with hyperglycemia and peripheral neuropathy -Presented with initial glucose of 180  -Last hemoglobin A1c 7.1 on 09/26/2019  -Home medications including glargine 5 units daily and sliding scale with meals. -Continue gabapentin for neuropathy -Continue Lantus, insulin sliding scale and CBG monitoring  Essential hypertension -Continue hydralazine, Lasix  Chronic kidney disease stage IV -Creatinine currently 1.92 and appears to be stable -Continue to monitor closely given diuresis   Anemia of chronic disease/normocytic anemia  -Hemoglobin appears to be at baseline, continue to monitor CBC  Hyperlipidemia  -Continue statin  Deconditioning -PT recommended home health -OT consult pending  DVT Prophylaxis Heparin  Code Status: Full  Family Communication: None at bedside  Disposition Plan:  Status is: Inpatient  Remains inpatient appropriate because:Inpatient level of care appropriate due to severity of illness   Dispo: The patient is from: Home              Anticipated d/c is to: Home              Anticipated d/c date is: 3 days              Patient currently is not medically stable to d/c.  Consultants Cardiology   Procedures  None  Antibiotics   Anti-infectives (From admission, onward)   Start     Dose/Rate Route Frequency Ordered Stop   11/19/19  1830  cefTRIAXone (ROCEPHIN) 2 g in sodium chloride 0.9 % 100 mL IVPB     Discontinue     2 g 200 mL/hr over 30 Minutes Intravenous Every 24  hours 11/19/19 1807     11/19/19 1830  metroNIDAZOLE (FLAGYL) IVPB 500 mg     Discontinue     500 mg 100 mL/hr over 60 Minutes Intravenous Every 8 hours 11/19/19 1807        Subjective:   Kara Hanson seen and examined today.  He is complaining of shortness of breath.  Complains of abdominal pain along with "a clot of diarrhea".  Denies current chest pain.  Denies nausea or vomiting, dizziness or headache. Objective:   Vitals:   11/19/19 2333 11/20/19 0521 11/20/19 0812 11/20/19 1114  BP: (!) 142/93 (!) 151/77 127/78 127/60  Pulse: 100 93 81 87  Resp: 20 20 20 18   Temp: 98.1 F (36.7 C) 97.9 F (36.6 C) (!) 97.4 F (36.3 C) (!) 97.4 F (36.3 C)  TempSrc: Oral Oral Oral Oral  SpO2: 97% 94% 100% 100%  Weight: 64.1 kg     Height: 5' (1.524 m)       Intake/Output Summary (Last 24 hours) at 11/20/2019 1233 Last data filed at 11/20/2019 0033 Gross per 24 hour  Intake 120 ml  Output --  Net 120 ml   Filed Weights   11/19/19 2333  Weight: 64.1 kg    Exam  General: Well developed, well nourished, NAD, appears stated age  HEENT: NCAT, mucous membranes moist.   Cardiovascular: S1 S2 auscultated, RRR, no murmur  Respiratory: Increased work of breathing  Abdomen: Soft, nontender, nondistended, + bowel sounds  Extremities: warm dry without cyanosis clubbing or edema  Neuro: AAOx3, nonfocal  Psych: Appropriate mood and affect   Data Reviewed: I have personally reviewed following labs and imaging studies  CBC: Recent Labs  Lab 11/19/19 0510 11/19/19 0958  WBC 7.7  --   HGB 8.4* 8.2*  HCT 27.4* 24.0*  MCV 93.8  --   PLT 306  --    Basic Metabolic Panel: Recent Labs  Lab 11/19/19 0510 11/19/19 0958 11/20/19 0644  NA 137 137 139  K 3.7 3.9 3.9  CL 101  --  105  CO2 26  --  24  GLUCOSE 180*  --  138*  BUN 61*  --  63*  CREATININE 1.95*  --  1.92*  CALCIUM 9.6  --  9.4   GFR: Estimated Creatinine Clearance: 24.7 mL/min (A) (by C-G formula based on  SCr of 1.92 mg/dL (H)). Liver Function Tests: Recent Labs  Lab 11/19/19 2217  AST 12*  ALT 12  ALKPHOS 80  BILITOT 0.4  PROT 6.1*  ALBUMIN 2.7*   Recent Labs  Lab 11/19/19 2217  LIPASE 23   No results for input(s): AMMONIA in the last 168 hours. Coagulation Profile: No results for input(s): INR, PROTIME in the last 168 hours. Cardiac Enzymes: No results for input(s): CKTOTAL, CKMB, CKMBINDEX, TROPONINI in the last 168 hours. BNP (last 3 results) No results for input(s): PROBNP in the last 8760 hours. HbA1C: No results for input(s): HGBA1C in the last 72 hours. CBG: Recent Labs  Lab 11/19/19 1231 11/19/19 1933 11/19/19 2144 11/20/19 0810 11/20/19 1115  GLUCAP 206* 137* 160* 129* 154*   Lipid Profile: No results for input(s): CHOL, HDL, LDLCALC, TRIG, CHOLHDL, LDLDIRECT in the last 72 hours. Thyroid Function Tests: No results for input(s): TSH, T4TOTAL, FREET4, T3FREE, THYROIDAB in the  last 72 hours. Anemia Panel: No results for input(s): VITAMINB12, FOLATE, FERRITIN, TIBC, IRON, RETICCTPCT in the last 72 hours. Urine analysis:    Component Value Date/Time   COLORURINE AMBER (A) 09/26/2019 1645   APPEARANCEUR CLOUDY (A) 09/26/2019 1645   LABSPEC 1.014 09/26/2019 1645   PHURINE 5.0 09/26/2019 1645   GLUCOSEU 50 (A) 09/26/2019 1645   HGBUR SMALL (A) 09/26/2019 1645   BILIRUBINUR NEGATIVE 09/26/2019 1645   BILIRUBINUR negative 12/13/2017 1514   KETONESUR NEGATIVE 09/26/2019 1645   PROTEINUR >=300 (A) 09/26/2019 1645   UROBILINOGEN 0.2 12/13/2017 1514   NITRITE NEGATIVE 09/26/2019 1645   LEUKOCYTESUR SMALL (A) 09/26/2019 1645   Sepsis Labs: @LABRCNTIP (procalcitonin:4,lacticidven:4)  ) Recent Results (from the past 240 hour(s))  SARS Coronavirus 2 by RT PCR (hospital order, performed in [place] hospital lab) Nasopharyngeal Nasopharyngeal Swab     Status: None   Collection Time: 11/19/19  4:39 AM   Specimen: Nasopharyngeal Swab  Result Value Ref Range  Status   SARS Coronavirus 2 NEGATIVE NEGATIVE Final    Comment: (NOTE) SARS-CoV-2 target nucleic acids are NOT DETECTED.  The SARS-CoV-2 RNA is generally detectable in upper and lower respiratory specimens during the acute phase of infection. The lowest concentration of SARS-CoV-2 viral copies this assay can detect is 250 copies / mL. A negative result does not preclude SARS-CoV-2 infection and should not be used as the sole basis for treatment or other patient management decisions.  A negative result may occur with improper specimen collection / handling, submission of specimen other than nasopharyngeal swab, presence of viral mutation(s) within the areas targeted by this assay, and inadequate number of viral copies (<250 copies / mL). A negative result must be combined with clinical observations, patient history, and epidemiological information.  Fact Sheet for Patients:   StrictlyIdeas.no  Fact Sheet for Healthcare Providers: BankingDealers.co.za  This test is not yet approved or  cleared by the [place] FDA and has been authorized for detection and/or diagnosis of SARS-CoV-2 by FDA under an Emergency Use Authorization (EUA).  This EUA will remain in effect (meaning this test can be used) for the duration of the COVID-19 declaration under Section 564(b)(1) of the Act, 21 U.S.C. section 360bbb-3(b)(1), unless the authorization is terminated or revoked sooner.  Performed at [facility], [place] [address] [place], [place] [address]       Radiology Studies: CT ABDOMEN PELVIS WO CONTRAST  Result Date: 11/19/2019 CLINICAL DATA:  Abdominal distension EXAM: CT ABDOMEN AND PELVIS WITHOUT CONTRAST TECHNIQUE: Multidetector CT imaging of the abdomen and pelvis was performed following the standard protocol without IV contrast. COMPARISON:  01/24/2019 FINDINGS: Lower chest: Moderate bilateral pleural effusions are noted, with dense  areas of consolidation at the lung bases and bilateral air bronchograms, without significant change since prior CT. Heart is enlarged without pericardial effusion. Hepatobiliary: No focal liver abnormality is seen. Status post cholecystectomy. No biliary dilatation. Pancreas: Unremarkable. No pancreatic ductal dilatation or surrounding inflammatory changes. Spleen: Normal in size without focal abnormality. Adrenals/Urinary Tract: No urinary tract calculi or obstructive uropathy. Bladder is unremarkable with no filling defects. Adrenals are normal. Stomach/Bowel: No bowel obstruction or ileus. Normal appendix right lower quadrant. There is diverticulosis of the descending colon, with focal areas of inflammatory change along the anteromedial aspect proximal descending colon, reference images 47-50/2, consistent with focal acute uncomplicated diverticulitis. Vascular/Lymphatic: Aortic atherosclerosis. No enlarged abdominal or pelvic lymph nodes. Reproductive: Stable uterine fibroids. No adnexal masses. Other: No free fluid or free  gas within the abdomen. Fat containing midline ventral hernia unchanged. No bowel herniation. Musculoskeletal: No acute or destructive bony lesions. Reconstructed images demonstrate no additional findings. IMPRESSION: 1. Focal acute uncomplicated diverticulitis of the proximal descending colon. 2. Moderate bilateral pleural effusions with dense areas of consolidation at the lung bases and bilateral air bronchograms, without significant change since prior CT. 3. Aortic Atherosclerosis (ICD10-I70.0). Electronically Signed   By: Randa Ngo M.D.   On: 11/19/2019 16:15   DG Abd 1 View  Result Date: 11/20/2019 CLINICAL DATA:  Abdominal pain with diarrhea EXAM: ABDOMEN - 1 VIEW COMPARISON:  CT abdomen and pelvis November 19, 2019 FINDINGS: There is moderate stool in the colon. There is no bowel dilatation or air-fluid level to suggest bowel obstruction. No free air. There are surgical clips in  the gallbladder fossa region. There are small apparent phleboliths in the pelvis. IMPRESSION: No bowel obstruction or free air evident.  Moderate stool in colon. Electronically Signed   By: Lowella Grip III M.D.   On: 11/20/2019 09:57   DG Chest Portable 1 View  Result Date: 11/19/2019 CLINICAL DATA:  Shortness of breath, lethargic EXAM: PORTABLE CHEST 1 VIEW COMPARISON:  Radiograph 09/26/2019 FINDINGS: Central vascular congestion with diffusely increased hazy interstitial opacities and more patchy and confluent infrahilar opacity partially silhouetting the lung bases likely with bilateral effusions. Fissural and septal thickening is noted as well. No pneumothorax. Mild cardiomegaly similar to comparison imaging accounting for differences in technique. The aorta is calcified. The remaining cardiomediastinal contours are unremarkable. No acute osseous or soft tissue abnormality. Degenerative changes are present in the imaged spine and shoulders. Telemetry leads overlie the chest. IMPRESSION: Features of pulmonary edema with bilateral effusions. More patchy and confluent infrahilar opacity could reflect developing alveolar edema or infection/consolidation. Cardiomegaly similar to priors. Aortic Atherosclerosis (ICD10-I70.0). Electronically Signed   By: Lovena Le M.D.   On: 11/19/2019 05:07     Scheduled Meds: . aspirin EC  81 mg Oral q AM  . atorvastatin  80 mg Oral QHS  . DULoxetine  30 mg Oral QHS  . fluticasone  2 spray Each Nare Daily  . furosemide  80 mg Intravenous BID  . gabapentin  100 mg Oral BID  . heparin  5,000 Units Subcutaneous Q8H  . hydrALAZINE  12.5 mg Oral TID  . insulin aspart  0-9 Units Subcutaneous TID WC  . insulin glargine  5 Units Subcutaneous Daily  . pantoprazole  20 mg Oral Daily  . primidone  50 mg Oral BID  . sodium chloride flush  3 mL Intravenous Q12H   Continuous Infusions: . sodium chloride    . cefTRIAXone (ROCEPHIN)  IV Stopped (11/20/19 0210)  .  metronidazole 500 mg (11/20/19 0839)     LOS: 1 day   Time Spent in minutes   45 minutes  Miia Blanks D.O. on 11/20/2019 at 12:33 PM  Between 7am to 7pm - Please see pager noted on [email or web address]  After 7pm go to [email or web address]  And look for the night coverage person covering for me after hours  [facility] Office  [phone]

## 2019-11-20 NOTE — Consult Note (Signed)
   [facility] Inpatient Consult   11/20/2019  Kara Hanson [birth year] [id number]   [employer] [ACO] Patient: Medicare Next[employer]  11/21/19 Spoke with patient via hospital phone, HIPAA verified, regarding post hospital follow up needs.  She confirms Dr. [name] as her primary care. Patient's medical record reviewed for ongoing follow up needs.  Patient states she was unsure of the issue will personal care services.  Explained [facility] Care Management services with assistance for care management needs.  She verbalized that her daughter [name] can be contacted for information and assistance.  11/20/19 3:00 pm Patient screened for extreme high risk  for unplanned readmission score of 40% noted. Medical record reviewed to check if potential [place] Management service needs.  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  Primary Care Provider is Dr. [name], [facility]  this provider is listed to provide the transition of care [TOC] for post hospital follow up.   Plan:  11/21/19 1414 Will arrange outreach from [place] for post hospital call from complex care management. Please place a [facility] Care Management consult as appropriate and for questions contact:   Natividad Brood, RN BSN [facility] Liaison  [phone] business mobile phone Toll free office [phone]  Fax number: [phone] [email or web address]

## 2019-11-21 LAB — BASIC METABOLIC PANEL
Anion gap: 10 (ref 5–15)
BUN: 58 mg/dL — ABNORMAL HIGH (ref 8–23)
CO2: 25 mmol/L (ref 22–32)
Calcium: 9.3 mg/dL (ref 8.9–10.3)
Chloride: 103 mmol/L (ref 98–111)
Creatinine, Ser: 1.96 mg/dL — ABNORMAL HIGH (ref 0.44–1.00)
GFR calc Af Amer: 31 mL/min — ABNORMAL LOW (ref >60)
GFR calc non Af Amer: 26 mL/min — ABNORMAL LOW (ref >60)
Glucose, Bld: 96 mg/dL (ref 70–99)
Potassium: 3.8 mmol/L (ref 3.5–5.1)
Sodium: 138 mmol/L (ref 135–145)

## 2019-11-21 LAB — C DIFFICILE QUICK SCREEN W PCR REFLEX
C Diff antigen: POSITIVE — AB
C Diff toxin: NEGATIVE

## 2019-11-21 LAB — GLUCOSE, CAPILLARY
Glucose-Capillary: 92 mg/dL (ref 70–99)
Glucose-Capillary: 161 mg/dL — ABNORMAL HIGH (ref 70–99)
Glucose-Capillary: 118 mg/dL — ABNORMAL HIGH (ref 70–99)
Glucose-Capillary: 137 mg/dL — ABNORMAL HIGH (ref 70–99)

## 2019-11-21 LAB — IRON AND TIBC
Iron: 47 ug/dL (ref 28–170)
Saturation Ratios: 24 % (ref 10.4–31.8)
TIBC: 199 ug/dL — ABNORMAL LOW (ref 250–450)
UIBC: 152 ug/dL

## 2019-11-21 LAB — HEMOGLOBIN AND HEMATOCRIT, BLOOD
HCT: 28.2 % — ABNORMAL LOW (ref 36.0–46.0)
Hemoglobin: 8.4 g/dL — ABNORMAL LOW (ref 12.0–15.0)

## 2019-11-21 LAB — CBC
HCT: 25.2 % — ABNORMAL LOW (ref 36.0–46.0)
Hemoglobin: 7.5 g/dL — ABNORMAL LOW (ref 12.0–15.0)
MCH: 27.9 pg (ref 26.0–34.0)
MCHC: 29.8 g/dL — ABNORMAL LOW (ref 30.0–36.0)
MCV: 93.7 fL (ref 80.0–100.0)
Platelets: 287 10*3/uL (ref 150–400)
RBC: 2.69 MIL/uL — ABNORMAL LOW (ref 3.87–5.11)
RDW: 17.1 % — ABNORMAL HIGH (ref 11.5–15.5)
WBC: 5.3 10*3/uL (ref 4.0–10.5)
nRBC: 0 % (ref 0.0–0.2)

## 2019-11-21 LAB — FERRITIN: Ferritin: 246 ng/mL (ref 11–307)

## 2019-11-21 LAB — OCCULT BLOOD X 1 CARD TO LAB, STOOL: Fecal Occult Bld: NEGATIVE

## 2019-11-21 LAB — RETICULOCYTES
Immature Retic Fract: 27.3 % — ABNORMAL HIGH (ref 2.3–15.9)
RBC.: 2.97 MIL/uL — ABNORMAL LOW (ref 3.87–5.11)
Retic Count, Absolute: 112.3 10*3/uL (ref 19.0–186.0)
Retic Ct Pct: 3.8 % — ABNORMAL HIGH (ref 0.4–3.1)

## 2019-11-21 LAB — VITAMIN B12: Vitamin B-12: 649 pg/mL (ref 180–914)

## 2019-11-21 LAB — FOLATE: Folate: 30 ng/mL (ref >5.9)

## 2019-11-21 LAB — CLOSTRIDIUM DIFFICILE BY PCR, REFLEXED: Toxigenic C. Difficile by PCR: NEGATIVE

## 2019-11-21 MED ORDER — LOPERAMIDE HCL 2 MG PO CAPS
2.0000 mg | ORAL_CAPSULE | ORAL | Status: DC | PRN
  Administered 2019-11-21 – 2019-11-23 (×2): 2 mg via ORAL
  Filled 2019-11-21 (×2): qty 1

## 2019-11-21 MED ORDER — GUAIFENESIN-DM 100-10 MG/5ML PO SYRP
5.0000 mL | ORAL_SOLUTION | ORAL | Status: DC | PRN
  Administered 2019-11-21 – 2019-11-25 (×7): 5 mL via ORAL
  Filled 2019-11-21 (×7): qty 5

## 2019-11-21 MED ORDER — CARVEDILOL 3.125 MG PO TABS
3.1250 mg | ORAL_TABLET | Freq: Two times a day (BID) | ORAL | Status: DC
  Administered 2019-11-21 – 2019-11-23 (×4): 3.125 mg via ORAL
  Filled 2019-11-21 (×4): qty 1

## 2019-11-21 NOTE — Progress Notes (Signed)
Physical Therapy Treatment Patient Details Name: Kara Hanson MRN: [id number] DOB: 11/03/1955 Today's Date: 11/21/2019    History of Present Illness 64 y.o. female with a history of mild CAD on cath in 02/2019, chronic combined CHF with LVEF of 20-25% on Echo in 09/2019, chronic respiratory failure on 4L of O2 at home, prior CVA, hypertension, hyperlipidemia, type 2 diabetes on insulin, CKD stage IV, chronic anemia, and COVID pneumonia in 07/2018. Brought to ED by EMS and was satting 76%O2 at home on 4L O2 improved on nonrebreather. In ED weaned to 6L via [place]. Chest x-ray showed cardiomegaly with pulmonary edema and bilateral effusions. Admitted 11/19/19 for treatment of CHF.    PT Comments    On entry pt requesting to go to [facility]. Pt points to abdomen and reports she feels pressure, frustrated that she still is having diarrhea. Pt is min guard for standing and min A for pivoting to BSC. Pt able to perform self pericare when provided with washcloth and steadying with RW. Pt able to ambulated 3 feet to sink to wash her hands with minA. While washing hands pt became diaphoretic, HR rose to 130s and pt reports crushing, pressure pain in her chest. Pt requires mod A to return to chair. Pt HR returns to 90s, and BP within acceptable range but chest pain continues. RN notified. Pt instructed in purse lip breathing as she complains that she can't get enough air due to her bloated stomach. Pt reports extreme disappointment with not being able to walk with therapy. RN notified MD. PT will continue to follow acutely.     Follow Up Recommendations  Home health PT;Supervision/Assistance - 24 hour     Equipment Recommendations  None recommended by PT (already has RW)       Precautions / Restrictions Precautions Precautions: None Restrictions Weight Bearing Restrictions: No    Mobility  Bed Mobility               General bed mobility comments: OOB in recliner on entry  Transfers Overall transfer  level: Needs assistance Equipment used: 1 person hand held assist Transfers: Sit to/from Stand;Stand Pivot Transfers Sit to Stand: Min guard Stand pivot transfers: Min assist       General transfer comment: min guard for safety with standing from recliner and BSC, minA for pivoting to [facility].   Ambulation/Gait Ambulation/Gait assistance: Min assist;Mod assist Gait Distance (Feet): 6 Feet Assistive device: 1 person hand held assist Gait Pattern/deviations: Step-through pattern;Decreased step length - right;Decreased step length - left;Shuffle;Antalgic Gait velocity: slowed Gait velocity interpretation: <1.31 ft/sec, indicative of household ambulator General Gait Details: min A for stepping to sink to wash hands, modA for stepping back to recliner after increased chest pain at sink       Balance Overall balance assessment: Needs assistance Sitting-balance support: Feet supported;Bilateral upper extremity supported Sitting balance-Leahy Scale: Poor     Standing balance support: Bilateral upper extremity supported Standing balance-Leahy Scale: Poor                              Cognition Arousal/Alertness: Awake/alert Behavior During Therapy: WFL for tasks assessed/performed;Flat affect Overall Cognitive Status: Within Functional Limits for tasks assessed                                           General Comments  General comments (skin integrity, edema, etc.): Pt on 7L O2 via HFNC with SaO2 >90%O2 throughout session       Pertinent Vitals/Pain Pain Assessment: Faces Faces Pain Scale: Hurts worst Pain Location: chest pain and pressure with activity, with underlying abdominal pain Pain Descriptors / Indicators: Aching;Cramping;Pressure;Crushing Pain Intervention(s): Limited activity within patient's tolerance;Monitored during session;Repositioned;Other (comment) (RN notifed of increased pain in chest)           PT Goals (current goals can now  be found in the care plan section) Acute Rehab PT Goals Patient Stated Goal: feel better PT Goal Formulation: With patient Time For Goal Achievement: 12/04/19 Potential to Achieve Goals: Good Progress towards PT goals: Progressing toward goals    Frequency    Min 3X/week      PT Plan Current plan remains appropriate       AM-PAC PT "6 Clicks" Mobility   Outcome Measure  Help needed turning from your back to your side while in a flat bed without using bedrails?: A Little Help needed moving from lying on your back to sitting on the side of a flat bed without using bedrails?: A Lot Help needed moving to and from a bed to a chair (including a wheelchair)?: A Little Help needed standing up from a chair using your arms (e.g., wheelchair or bedside chair)?: A Little Help needed to walk in hospital room?: A Lot Help needed climbing 3-5 steps with a railing? : A Lot 6 Click Score: 15    End of Session Equipment Utilized During Treatment: Gait belt Activity Tolerance: Patient limited by pain;Patient limited by fatigue Patient left: in chair;with call bell/phone within reach Nurse Communication: Mobility status PT Visit Diagnosis: Unsteadiness on feet (R26.81);Other abnormalities of gait and mobility (R26.89);Muscle weakness (generalized) (M62.81);Difficulty in walking, not elsewhere classified (R26.2);Pain Pain - part of body:  (stomach and chest)     Time: 1749-4496 PT Time Calculation (min) (ACUTE ONLY): 36 min  Charges:  $Therapeutic Activity: 23-37 mins                     Waynetta Metheny B. Migdalia Dk PT, DPT Acute Rehabilitation Services Pager [phone] Office [phone]    [place] 11/21/2019, 5:57 PM

## 2019-11-21 NOTE — Progress Notes (Signed)
Paged Cardiology about Hgb result per order. Patient stable at the moment. Will continue to monitor patient.

## 2019-11-21 NOTE — Progress Notes (Signed)
Patient's CDiff PCR is negative. Talked to ID and will discontinue enteric precautions order.

## 2019-11-21 NOTE — Progress Notes (Addendum)
Called by nurse as patient had episode of CP while standing at sink to wash hands. PT reported HR rose to 130s and patient became diaphoretic and dizzy. There are no corresponding events on telemetry, only NSR, with some artifact around that time. No acute change in oxygen requirement or blood pressure recorded at that time. This lasted 2 minutes and resolved getting back to bed. Her major complaint is continued bloating. Updated EKG shows NSR 94bpm, TWI I, II, avF, V4-V6 without marked change from prior. (She did have a brief episode of mild tachycardia around 12pm that appears to be either sinus tachycardia or ectopic atrial tachycardia but no symptoms reported that time.) Patient had nonobstructive CAD by cath 02/2019. Chart otherwise reviewed in detail - h/o CHF, previously requiring milrinone and palliative input, here for diuresis and acute on chronic hypoxic respiratory failure, also ongoing issues with anemia and C diff. Suspect discomfort precipitated by demand process with continued CHF superimposed on above medical problems and anemia. Discussed with Dr. Marlou Porch -> supportive care recommended with continued regimen for CHF. Will also check stat Hgb to ensure no acute change from prior. Addendum: Hgb stable/improved from earlier.  Azharia Surratt PA-C

## 2019-11-21 NOTE — TOC Initial Note (Signed)
Transition of Care [facility]) - Initial/Assessment Note    Patient Details  Name: Kara Hanson MRN: [id number] Date of Birth: [birth year]  Transition of Care [facility]) CM/SW Contact:    Ninfa Meeker, RN Phone Number: 11/21/2019, 11:41 AM  Clinical Narrative:   Patient is a 64 yr old female  admitted with acute hypoxic respiratory failure secondary to CHF exacerbation. Case manager spoke with patient via telephone to discuss needs for [facility]. Patient is oxygen dependent at home,  Patient states she has used [facility] in the past. Referral was called to Adela Lank, [facility] Liaison. TOC Team will continue to monitor.     Expected Discharge Plan: [place] Barriers to Discharge: Continued Medical Work up  Patient Goals and CMS Choice Patient states their goals for this hospitalization and ongoing recovery are:: get better CMS Medicare.gov Compare Post Acute Care list provided to:: Patient Choice offered to / list presented to : Patient  Expected Discharge Plan and Services Expected Discharge Plan: [place]   Discharge Planning Services: CM Consult Post Acute Care Choice: [place] arrangements for the past 2 months: Single Family Home                           HH Arranged: RN, PT, OT [facility] Agency: [place] Date [facility] Agency Contacted: 11/21/19 Time HH Agency Contacted: 63 Representative spoke with at [place]: Adela Lank  Prior Living Arrangements/Services Living arrangements for the past 2 months: [employer] Lives with:: Relatives Patient language and need for interpreter reviewed:: Yes Do you feel safe going back to the place where you live?: Yes      Need for Family Participation in Patient Care: Yes (Comment) Care giver support system in place?: Yes (comment) Current home services: DME (oxygen) Criminal Activity/Legal Involvement Pertinent to Current Situation/Hospitalization: No - Comment as  needed  Activities of Daily Living Home Assistive Devices/Equipment: Walker (specify type) (front wheel walker) ADL Screening (condition at time of admission) Patient's cognitive ability adequate to safely complete daily activities?: Yes Is the patient deaf or have difficulty hearing?: No Does the patient have difficulty seeing, even when wearing glasses/contacts?: Yes Does the patient have difficulty concentrating, remembering, or making decisions?: No Patient able to express need for assistance with ADLs?: Yes Does the patient have difficulty dressing or bathing?: Yes Independently performs ADLs?: No Communication: Independent Dressing (OT): Needs assistance Does the patient have difficulty walking or climbing stairs?: Yes Weakness of Legs: Both Weakness of Arms/Hands: Both  Permission Sought/Granted Permission sought to share information with : Case Manager       Permission granted to share info w AGENCY: [facility]        Emotional Assessment   Attitude/Demeanor/Rapport: Gracious   Orientation: : Oriented to Self, Oriented to Place, Oriented to  Time, Oriented to Situation Alcohol / Substance Use: Not Applicable Psych Involvement: No (comment)  Admission diagnosis:  CHF exacerbation ([place]) [I50.9] Acute on chronic systolic CHF (congestive heart failure) (HCC) [I50.23] Acute on chronic combined systolic and diastolic CHF (congestive heart failure) ([place]) [I50.43] Patient Active Problem List   Diagnosis Date Noted  . Abdominal pain 11/19/2019  . CKD (chronic kidney disease), stage IV ([place]) 11/19/2019  . Palliative care by specialist   . Goals of care, counseling/discussion   . Cardiorenal syndrome   . Hyponatremia 09/26/2019  . CHF exacerbation ([place]) 09/26/2019  . Acute on chronic  systolic CHF (congestive heart failure) ([place]) 05/19/2019  . Elevated troponin 04/18/2019  . Acute respiratory failure with hypoxia ([place]) 02/09/2019  . NSTEMI (non-ST elevated myocardial  infarction) ([place]) 02/09/2019  . S/P thoracentesis   . Atelectasis   . Chronic respiratory failure ([place])   . Acute renal failure superimposed on stage 3 chronic kidney disease ([place])   . Epigastric pain   . CHF (congestive heart failure) ([place]) 01/23/2019  . CAP (community acquired pneumonia) 10/31/2018  . Acute CHF (congestive heart failure) ([place]) 09/24/2018  . SIRS (systemic inflammatory response syndrome) ([place]) 07/25/2018  . COVID-19 virus infection 07/20/2018  . Chronic diastolic CHF (congestive heart failure) ([place]) 07/20/2018  . Pneumonia due to COVID-19 virus 07/20/2018  . Vitamin D deficiency 04/02/2018  . Spinal stenosis 03/29/2018  . Acute on chronic respiratory failure with hypoxia ([place]) 03/20/2018  . Controlled type 2 diabetes with neuropathy ([place]) 03/20/2018  . CKD (chronic kidney disease), stage III 03/20/2018  . Acute on chronic combined systolic and diastolic CHF (congestive heart failure) ([place]) 11/27/2017  . AKI (acute kidney injury) ([place]) 11/27/2017  . Acute respiratory distress 11/27/2017  . Bulging lumbar disc 11/14/2017  . Gout 11/14/2017  . Hyperlipidemia LDL goal <70 09/30/2016  . Chest pain 06/17/2016  . Stress-induced cardiomyopathy 05/19/2016  . Type 2 diabetes mellitus with diabetic neuropathy, unspecified ([place]) 05/06/2016  . Vertigo 05/06/2016  . Controlled type 2 diabetes mellitus with hyperglycemia ([place]) 04/23/2016  . Hypertension 04/23/2016  . Normochromic normocytic anemia 04/23/2016  . Syncope 04/23/2016   PCP:  [name], MD Pharmacy:   [place], [place] - [address] [place] [address] Phone: [phone] Fax: [phone]     Social Determinants of Health (SDOH) Interventions    Readmission Risk Interventions Readmission Risk Prevention Plan 10/03/2019 05/22/2019 02/11/2019  Transportation Screening Complete Complete Complete  PCP or Specialist Appt within 3-5 Days - - -  Not  Complete comments - - -  HRI or [place] for [place] - - -  Medication Review Press photographer) Complete Complete Complete  PCP or Specialist appointment within 3-5 days of discharge - Complete Complete  HRI or [place] Complete Complete Complete  SW Recovery Care/Counseling Consult Complete Complete Complete  Palliative Care Screening Not Applicable Not Applicable Not [place] Not Applicable Not Applicable Not Applicable  Some recent data might be hidden

## 2019-11-21 NOTE — Progress Notes (Signed)
PROGRESS NOTE    Kara Hanson  DPO:[id number] DOB: [birth year] DOA: 11/19/2019 PCP: [name], MD   Brief Narrative:  HPI On 11/19/2019 by Dr. Fuller Plan Kara Hanson is a 64 y.o. female with medical history significant of hypertension, systolic congestive heart failure (last EF 20-25%), insulin-dependent diabetes mellitus, chronic hypoxic respiratory failure on 4 L of nasal cannula oxygen, pneumonia due to COVID-19/2020, chronic kidney disease stage III presents with complaints of being unable woken up out of her sleep unable to breathe. Reported associated symptoms of chest tightness, epigastric abdominal discomfort, abdominal distention, and nausea. She has been compliant with all of her home medications. Denies any fever, chills, cough, weight gain, leg swelling, vomiting, dysuria, diarrhea, or constipation. She reports that is hard to take deep inspiratory breath. Her last bowel movement was yesterday.  EMS noted that the patient was satting 76% on home oxygen of 4 L and improved after being placed on a nonrebreather.  Interim history Admitted with acute hypoxic respiratory failure secondary to CHF exacerbation.  Cardiology consulted, currently on IV Lasix.  Assessment & Plan   Acute respiratory failure with hypoxia secondary to Acute combined systolic and diastolic CHF exacerbation -Patient currently requiring 7 L of oxygen (usually on 4 L at home) -Patient was noted to be hypoxic on admission -BNP 863.1; chest x-ray revealed pulmonary edema with bilateral effusions -Of note patient was recently hospitalized for CHF exacerbation back in June 2021 -Echocardiogram 09/27/2019 showed an EF of 4315%, grade 3 diastolic dysfunction -Patient recently had undergone right heart catheterization showing elevated biventricular filling pressures -Currently on IV Lasix 80 mg twice daily -Continue to monitor intake and output, daily weights -Cardiology consulted and  appreciated -restarting coreg 3.125mg  BID   Abdominal pain with diarrhea -Continues to complain of abdominal pain and states she had "a lot of diarrhea today" -CT abdomen pelvis showed focal acute uncomplicated diverticulitis of the proximal descending colon, moderate bilateral pleural effusions with dense areas of consolidation at lung bases -C diff pending -Was placed on IV ceftriaxone and flagyl -Abdominal x-ray obtained today showing no obstruction or free air.  Moderate stool.  Diabetes mellitus type 2, uncontrolled with hyperglycemia and peripheral neuropathy -Presented with initial glucose of 180  -Last hemoglobin A1c 7.1 on 09/26/2019  -Home medications including glargine 5 units daily and sliding scale with meals. -Continue gabapentin for neuropathy -Continue Lantus, insulin sliding scale and CBG monitoring  Essential hypertension -Continue hydralazine, Lasix -Coreg restarted today  Chronic kidney disease stage IV -Creatinine currently 1.96 and appears to be stable -Continue to monitor closely given diuresis   Anemia of chronic disease/normocytic anemia  -Hemoglobin dropped to 7.5 today (baseline 8-9) -anemia panel obtained showing iron of 47, ferritin 246, saturation ratio 24 -Continue to monitor CBC, if hemoglobin drops below 7, will transfuse -will also order FOBT  Hyperlipidemia  -Continue statin  Deconditioning -PT recommended home health -OT consult pending  DVT Prophylaxis Heparin  Code Status: Full  Family Communication: None at bedside  Disposition Plan:  Status is: Inpatient  Remains inpatient appropriate because:Inpatient level of care appropriate due to severity of illness   Dispo: The patient is from: Home              Anticipated d/c is to: Home              Anticipated d/c date is: 3 days              Patient currently is not medically stable to d/c.  Consultants Cardiology   Procedures  None  Antibiotics   Anti-infectives (From  admission, onward)   Start     Dose/Rate Route Frequency Ordered Stop   11/19/19 1830  cefTRIAXone (ROCEPHIN) 2 g in sodium chloride 0.9 % 100 mL IVPB        2 g 200 mL/hr over 30 Minutes Intravenous Every 24 hours 11/19/19 1807     11/19/19 1830  metroNIDAZOLE (FLAGYL) IVPB 500 mg        500 mg 100 mL/hr over 60 Minutes Intravenous Every 8 hours 11/19/19 1807        Subjective:   [name] seen and examined today.  Feels her shortness of breath is mildly improved today however she continues to be on 7 L of oxygen.  States that she did have 1 loose bowel movement early this morning but not as much diarrhea as yesterday.  Denies current chest pain, dizziness or headache.  Denies current abdominal pain, nausea or vomiting.  Objective:   Vitals:   11/20/19 2005 11/21/19 0013 11/21/19 0414 11/21/19 0415  BP: 136/74 135/82  139/73  Pulse: 89 83    Resp: 17 15 17    Temp: 98 F (36.7 C) 98.3 F (36.8 C) 97.9 F (36.6 C)   TempSrc: Oral Oral Oral   SpO2: 98% 100%    Weight:   63.5 kg   Height:        Intake/Output Summary (Last 24 hours) at 11/21/2019 1241 Last data filed at 11/21/2019 0518 Gross per 24 hour  Intake --  Output 750 ml  Net -750 ml   Filed Weights   11/19/19 2333 11/21/19 0414  Weight: 64.1 kg 63.5 kg   Exam  General: Well developed, well nourished, NAD, appears stated age  79: NCAT,  mucous membranes moist.   Cardiovascular: S1 S2 auscultated, RRR, no murmur  Respiratory: Clear to auscultation bilaterally with equal chest rise  Abdomen: Soft, nontender, nondistended, + bowel sounds  Extremities: warm dry without cyanosis clubbing or edema  Neuro: AAOx3, nonfocal  Psych: appropriate mood and affect   Data Reviewed: I have personally reviewed following labs and imaging studies  CBC: Recent Labs  Lab 11/19/19 0510 11/19/19 0958 11/21/19 0439  WBC 7.7  --  5.3  HGB 8.4* 8.2* 7.5*  HCT 27.4* 24.0* 25.2*  MCV 93.8  --  93.7  PLT 306   --  010   Basic Metabolic Panel: Recent Labs  Lab 11/19/19 0510 11/19/19 0958 11/20/19 0644 11/21/19 0439  NA 137 137 139 138  K 3.7 3.9 3.9 3.8  CL 101  --  105 103  CO2 26  --  24 25  GLUCOSE 180*  --  138* 96  BUN 61*  --  63* 58*  CREATININE 1.95*  --  1.92* 1.96*  CALCIUM 9.6  --  9.4 9.3   GFR: Estimated Creatinine Clearance: 24.1 mL/min (A) (by C-G formula based on SCr of 1.96 mg/dL (H)). Liver Function Tests: Recent Labs  Lab 11/19/19 2217  AST 12*  ALT 12  ALKPHOS 80  BILITOT 0.4  PROT 6.1*  ALBUMIN 2.7*   Recent Labs  Lab 11/19/19 2217  LIPASE 23   No results for input(s): AMMONIA in the last 168 hours. Coagulation Profile: No results for input(s): INR, PROTIME in the last 168 hours. Cardiac Enzymes: No results for input(s): CKTOTAL, CKMB, CKMBINDEX, TROPONINI in the last 168 hours. BNP (last 3 results) No results for input(s): PROBNP in the last 8760  hours. HbA1C: No results for input(s): HGBA1C in the last 72 hours. CBG: Recent Labs  Lab 11/20/19 1115 11/20/19 1636 11/20/19 2127 11/21/19 0824 11/21/19 1132  GLUCAP 154* 88 124* 92 161*   Lipid Profile: No results for input(s): CHOL, HDL, LDLCALC, TRIG, CHOLHDL, LDLDIRECT in the last 72 hours. Thyroid Function Tests: No results for input(s): TSH, T4TOTAL, FREET4, T3FREE, THYROIDAB in the last 72 hours. Anemia Panel: Recent Labs    11/21/19 0852  VITAMINB12 649  FOLATE 30.0  FERRITIN 246  TIBC 199*  IRON 47  RETICCTPCT 3.8*   Urine analysis:    Component Value Date/Time   COLORURINE AMBER (A) 09/26/2019 1645   APPEARANCEUR CLOUDY (A) 09/26/2019 1645   LABSPEC 1.014 09/26/2019 1645   PHURINE 5.0 09/26/2019 1645   GLUCOSEU 50 (A) 09/26/2019 1645   HGBUR SMALL (A) 09/26/2019 1645   BILIRUBINUR NEGATIVE 09/26/2019 1645   BILIRUBINUR negative 12/13/2017 1514   KETONESUR NEGATIVE 09/26/2019 1645   PROTEINUR >=300 (A) 09/26/2019 1645   UROBILINOGEN 0.2 12/13/2017 1514   NITRITE  NEGATIVE 09/26/2019 1645   LEUKOCYTESUR SMALL (A) 09/26/2019 1645   Sepsis Labs: @LABRCNTIP (procalcitonin:4,lacticidven:4)  ) Recent Results (from the past 240 hour(s))  SARS Coronavirus 2 by RT PCR (hospital order, performed in [place] hospital lab) Nasopharyngeal Nasopharyngeal Swab     Status: None   Collection Time: 11/19/19  4:39 AM   Specimen: Nasopharyngeal Swab  Result Value Ref Range Status   SARS Coronavirus 2 NEGATIVE NEGATIVE Final    Comment: (NOTE) SARS-CoV-2 target nucleic acids are NOT DETECTED.  The SARS-CoV-2 RNA is generally detectable in upper and lower respiratory specimens during the acute phase of infection. The lowest concentration of SARS-CoV-2 viral copies this assay can detect is 250 copies / mL. A negative result does not preclude SARS-CoV-2 infection and should not be used as the sole basis for treatment or other patient management decisions.  A negative result may occur with improper specimen collection / handling, submission of specimen other than nasopharyngeal swab, presence of viral mutation(s) within the areas targeted by this assay, and inadequate number of viral copies (<250 copies / mL). A negative result must be combined with clinical observations, patient history, and epidemiological information.  Fact Sheet for Patients:   StrictlyIdeas.no  Fact Sheet for Healthcare Providers: BankingDealers.co.za  This test is not yet approved or  cleared by the [place] FDA and has been authorized for detection and/or diagnosis of SARS-CoV-2 by FDA under an Emergency Use Authorization (EUA).  This EUA will remain in effect (meaning this test can be used) for the duration of the COVID-19 declaration under Section 564(b)(1) of the Act, 21 U.S.C. section 360bbb-3(b)(1), unless the authorization is terminated or revoked sooner.  Performed at [facility], [place] [address], [place],  [place] [address]       Radiology Studies: CT ABDOMEN PELVIS WO CONTRAST  Result Date: 11/19/2019 CLINICAL DATA:  Abdominal distension EXAM: CT ABDOMEN AND PELVIS WITHOUT CONTRAST TECHNIQUE: Multidetector CT imaging of the abdomen and pelvis was performed following the standard protocol without IV contrast. COMPARISON:  01/24/2019 FINDINGS: Lower chest: Moderate bilateral pleural effusions are noted, with dense areas of consolidation at the lung bases and bilateral air bronchograms, without significant change since prior CT. Heart is enlarged without pericardial effusion. Hepatobiliary: No focal liver abnormality is seen. Status post cholecystectomy. No biliary dilatation. Pancreas: Unremarkable. No pancreatic ductal dilatation or surrounding inflammatory changes. Spleen: Normal in size without focal abnormality. Adrenals/Urinary Tract: No  urinary tract calculi or obstructive uropathy. Bladder is unremarkable with no filling defects. Adrenals are normal. Stomach/Bowel: No bowel obstruction or ileus. Normal appendix right lower quadrant. There is diverticulosis of the descending colon, with focal areas of inflammatory change along the anteromedial aspect proximal descending colon, reference images 47-50/2, consistent with focal acute uncomplicated diverticulitis. Vascular/Lymphatic: Aortic atherosclerosis. No enlarged abdominal or pelvic lymph nodes. Reproductive: Stable uterine fibroids. No adnexal masses. Other: No free fluid or free gas within the abdomen. Fat containing midline ventral hernia unchanged. No bowel herniation. Musculoskeletal: No acute or destructive bony lesions. Reconstructed images demonstrate no additional findings. IMPRESSION: 1. Focal acute uncomplicated diverticulitis of the proximal descending colon. 2. Moderate bilateral pleural effusions with dense areas of consolidation at the lung bases and bilateral air bronchograms, without significant change since prior CT. 3. Aortic Atherosclerosis  (ICD10-I70.0). Electronically Signed   By: Randa Ngo M.D.   On: 11/19/2019 16:15   DG Abd 1 View  Result Date: 11/20/2019 CLINICAL DATA:  Abdominal pain with diarrhea EXAM: ABDOMEN - 1 VIEW COMPARISON:  CT abdomen and pelvis November 19, 2019 FINDINGS: There is moderate stool in the colon. There is no bowel dilatation or air-fluid level to suggest bowel obstruction. No free air. There are surgical clips in the gallbladder fossa region. There are small apparent phleboliths in the pelvis. IMPRESSION: No bowel obstruction or free air evident.  Moderate stool in colon. Electronically Signed   By: Lowella Grip III M.D.   On: 11/20/2019 09:57     Scheduled Meds:  aspirin EC  81 mg Oral q AM   atorvastatin  80 mg Oral QHS   carvedilol  3.125 mg Oral BID WC   DULoxetine  30 mg Oral QHS   fluticasone  2 spray Each Nare Daily   furosemide  80 mg Intravenous BID   gabapentin  100 mg Oral BID   heparin  5,000 Units Subcutaneous Q8H   hydrALAZINE  12.5 mg Oral TID   insulin aspart  0-9 Units Subcutaneous TID WC   insulin glargine  5 Units Subcutaneous Daily   pantoprazole  20 mg Oral Daily   primidone  50 mg Oral BID   sodium chloride flush  3 mL Intravenous Q12H   Continuous Infusions:  sodium chloride     cefTRIAXone (ROCEPHIN)  IV Stopped (11/20/19 2235)   metronidazole 500 mg (11/21/19 1026)     LOS: 2 days   Time Spent in minutes   45 minutes  Biddie Sebek D.O. on 11/21/2019 at 12:41 PM  Between 7am to 7pm - Please see pager noted on [email or web address]  After 7pm go to [email or web address]  And look for the night coverage person covering for me after hours  [facility] Office  [phone]

## 2019-11-21 NOTE — Progress Notes (Signed)
Patient has not had a bowel movement on this shift. Unable to send stool sample for c.diff testing at this time. Will send when able.

## 2019-11-21 NOTE — Progress Notes (Signed)
Progress Note  Patient Name: Kara Hanson Date of Encounter: 11/21/2019  [facility] Cardiologist: Candee Furbish, MD   Subjective   Laying in bed.  She has had a small bowel movement.  She is getting ruled out for C. difficile.  Inpatient Medications    Scheduled Meds: . aspirin EC  81 mg Oral q AM  . atorvastatin  80 mg Oral QHS  . DULoxetine  30 mg Oral QHS  . fluticasone  2 spray Each Nare Daily  . furosemide  80 mg Intravenous BID  . gabapentin  100 mg Oral BID  . heparin  5,000 Units Subcutaneous Q8H  . hydrALAZINE  12.5 mg Oral TID  . insulin aspart  0-9 Units Subcutaneous TID WC  . insulin glargine  5 Units Subcutaneous Daily  . pantoprazole  20 mg Oral Daily  . primidone  50 mg Oral BID  . sodium chloride flush  3 mL Intravenous Q12H   Continuous Infusions: . sodium chloride    . cefTRIAXone (ROCEPHIN)  IV Stopped (11/20/19 2235)  . metronidazole Stopped (11/21/19 0445)   PRN Meds: sodium chloride, acetaminophen, guaiFENesin-dextromethorphan, ondansetron (ZOFRAN) IV, sodium chloride flush   Vital Signs    Vitals:   11/20/19 2005 11/21/19 0013 11/21/19 0414 11/21/19 0415  BP: 136/74 135/82  139/73  Pulse: 89 83    Resp: 17 15 17    Temp: 98 F (36.7 C) 98.3 F (36.8 C) 97.9 F (36.6 C)   TempSrc: Oral Oral Oral   SpO2: 98% 100%    Weight:   63.5 kg   Height:        Intake/Output Summary (Last 24 hours) at 11/21/2019 1029 Last data filed at 11/21/2019 0518 Gross per 24 hour  Intake --  Output 750 ml  Net -750 ml   Last 3 Weights 11/21/2019 11/19/2019 11/13/2019  Weight (lbs) 139 lb 14.4 oz 141 lb 4.8 oz 139 lb 12.8 oz  Weight (kg) 63.458 kg 64.093 kg 63.413 kg      Telemetry    Sinus rhythm no adverse arrhythmias- Personally Reviewed  ECG    No new- Personally Reviewed  Physical Exam   GEN: Well nourished, well developed, in no acute distress, laying in bed HEENT: normal  Neck: no JVD, carotid bruits, or masses Cardiac: RRR; no  murmurs, rubs, or gallops,no edema  Respiratory:  clear to auscultation bilaterally, normal work of breathing GI: soft, nontender, nondistended, + BS MS: no deformity or atrophy  Skin: warm and dry, no rash Neuro:  Alert and Oriented x 3, Strength and sensation are intact Psych: euthymic mood, full affect   Labs    High Sensitivity Troponin:   Recent Labs  Lab 11/19/19 0510  TROPONINIHS 21*      Chemistry Recent Labs  Lab 11/19/19 0510 11/19/19 0510 11/19/19 0958 11/19/19 2217 11/20/19 0644 11/21/19 0439  NA 137   < > 137  --  139 138  K 3.7   < > 3.9  --  3.9 3.8  CL 101  --   --   --  105 103  CO2 26  --   --   --  24 25  GLUCOSE 180*  --   --   --  138* 96  BUN 61*  --   --   --  63* 58*  CREATININE 1.95*  --   --   --  1.92* 1.96*  CALCIUM 9.6  --   --   --  9.4 9.3  PROT  --   --   --  6.1*  --   --   ALBUMIN  --   --   --  2.7*  --   --   AST  --   --   --  12*  --   --   ALT  --   --   --  12  --   --   ALKPHOS  --   --   --  80  --   --   BILITOT  --   --   --  0.4  --   --   GFRNONAA 27*  --   --   --  27* 26*  GFRAA 31*  --   --   --  31* 31*  ANIONGAP 10  --   --   --  10 10   < > = values in this interval not displayed.     Hematology Recent Labs  Lab 11/19/19 0510 11/19/19 0958 11/21/19 0439 11/21/19 0852  WBC 7.7  --  5.3  --   RBC 2.92*  --  2.69* 2.97*  HGB 8.4* 8.2* 7.5*  --   HCT 27.4* 24.0* 25.2*  --   MCV 93.8  --  93.7  --   MCH 28.8  --  27.9  --   MCHC 30.7  --  29.8*  --   RDW 16.9*  --  17.1*  --   PLT 306  --  287  --     BNP Recent Labs  Lab 11/19/19 0510  BNP 863.1*     DDimer No results for input(s): DDIMER in the last 168 hours.   Radiology    CT ABDOMEN PELVIS WO CONTRAST  Result Date: 11/19/2019 CLINICAL DATA:  Abdominal distension EXAM: CT ABDOMEN AND PELVIS WITHOUT CONTRAST TECHNIQUE: Multidetector CT imaging of the abdomen and pelvis was performed following the standard protocol without IV contrast.  COMPARISON:  01/24/2019 FINDINGS: Lower chest: Moderate bilateral pleural effusions are noted, with dense areas of consolidation at the lung bases and bilateral air bronchograms, without significant change since prior CT. Heart is enlarged without pericardial effusion. Hepatobiliary: No focal liver abnormality is seen. Status post cholecystectomy. No biliary dilatation. Pancreas: Unremarkable. No pancreatic ductal dilatation or surrounding inflammatory changes. Spleen: Normal in size without focal abnormality. Adrenals/Urinary Tract: No urinary tract calculi or obstructive uropathy. Bladder is unremarkable with no filling defects. Adrenals are normal. Stomach/Bowel: No bowel obstruction or ileus. Normal appendix right lower quadrant. There is diverticulosis of the descending colon, with focal areas of inflammatory change along the anteromedial aspect proximal descending colon, reference images 47-50/2, consistent with focal acute uncomplicated diverticulitis. Vascular/Lymphatic: Aortic atherosclerosis. No enlarged abdominal or pelvic lymph nodes. Reproductive: Stable uterine fibroids. No adnexal masses. Other: No free fluid or free gas within the abdomen. Fat containing midline ventral hernia unchanged. No bowel herniation. Musculoskeletal: No acute or destructive bony lesions. Reconstructed images demonstrate no additional findings. IMPRESSION: 1. Focal acute uncomplicated diverticulitis of the proximal descending colon. 2. Moderate bilateral pleural effusions with dense areas of consolidation at the lung bases and bilateral air bronchograms, without significant change since prior CT. 3. Aortic Atherosclerosis (ICD10-I70.0). Electronically Signed   By: Randa Ngo M.D.   On: 11/19/2019 16:15   DG Abd 1 View  Result Date: 11/20/2019 CLINICAL DATA:  Abdominal pain with diarrhea EXAM: ABDOMEN - 1 VIEW COMPARISON:  CT abdomen and pelvis November 19, 2019 FINDINGS: There is moderate stool in  the colon. There is no  bowel dilatation or air-fluid level to suggest bowel obstruction. No free air. There are surgical clips in the gallbladder fossa region. There are small apparent phleboliths in the pelvis. IMPRESSION: No bowel obstruction or free air evident.  Moderate stool in colon. Electronically Signed   By: Lowella Grip III M.D.   On: 11/20/2019 09:57    Cardiac Studies   Right Heart Catheterization 09/30/2019: Findings: RA = 13 RV = 63/16 PA = 62/23 (40) PCW = 27 (v=43) Fick cardiac output/index = 4.8/2.9 Thermo CO/CI = 2.3/1.4 PVR = 5.7 WU (Thermo) Ao sat = 95% PA sat = 54%, 56%  Assessment: 1. Markedly elevated biventricular filling pressures  Echocardiogram 09/27/2019: Impressions: 1. Left ventricular ejection fraction, by estimation, is 20 to 25%. The  left ventricle has severely decreased function. The left ventricle  demonstrates global hypokinesis. The left ventricular internal cavity size  was mildly dilated. There is mild  asymmetric left ventricular hypertrophy of the inferolateral segment. Left  ventricular diastolic parameters are consistent with Grade III diastolic  dysfunction (restrictive). Elevated left ventricular end-diastolic  pressure.  2. Right ventricular systolic function is normal. The right ventricular  size is normal. There is mildly elevated pulmonary artery systolic  pressure. The estimated right ventricular systolic pressure is 46.8 mmHg.  3. Left atrial size was moderately dilated.  4. The mitral valve is grossly normal. Mild mitral valve regurgitation  due to LV dysfunction with leaflet tenting.  5. Tricuspid valve regurgitation is moderate.  6. The aortic valve is abnormal. AV gradient may be underestimated due to  LV dysfunction.  7. The inferior vena cava is normal in size with <50% respiratory  variability, suggesting right atrial pressure of 8 mmHg.   Patient Profile     63 y.o. female with a history of mild CAD on cath in 02/2019,  chronic combined CHF with LVEF of 20-25% on Echo in 09/2019, chronic respiratory failure on 4L of O2 at home, prior CVA, hypertension, hyperlipidemia, type 2 diabetes on insulin, CKD stage IV, chronic anemia, and COVID pneumonia in 07/2018 who is being seen for CHF   Assessment & Plan    Acute on chronic combined systolic/diastolic heart failure, nonischemic cardiomyopathy, biventricular failure-CKD 4 -She has had complete work-ups previously right and left heart cath with advanced heart failure team.  Previous hospitalization was on milrinone IV which was eventually weaned.  Discussions with palliative care team ensued. -Continue with IV Lasix 80 mg IV twice daily again today. -Continue with hydralazine 12.5 3 times daily.  She has not been able to tolerate other medications. -Restart carvedilol at 3.125 BID.  Abdominal pain -Upon further review of CT scan, potential inflammatory changes suggestive of proximal descending diverticulitis noted.  She is being treated with antibiotics.  She is also being ruled out for C. difficile      For questions or updates, please contact [place] Please consult [email or web address] for contact info under        Signed, Candee Furbish, MD  11/21/2019, 10:29 AM

## 2019-11-22 ENCOUNTER — Telehealth: Payer: Self-pay

## 2019-11-22 ENCOUNTER — Telehealth: Payer: Self-pay | Admitting: Family Medicine

## 2019-11-22 ENCOUNTER — Other Ambulatory Visit (INDEPENDENT_AMBULATORY_CARE_PROVIDER_SITE_OTHER): Payer: Self-pay

## 2019-11-22 LAB — BASIC METABOLIC PANEL
Anion gap: 10 (ref 5–15)
BUN: 53 mg/dL — ABNORMAL HIGH (ref 8–23)
CO2: 24 mmol/L (ref 22–32)
Calcium: 9.2 mg/dL (ref 8.9–10.3)
Chloride: 103 mmol/L (ref 98–111)
Creatinine, Ser: 1.8 mg/dL — ABNORMAL HIGH (ref 0.44–1.00)
GFR calc Af Amer: 34 mL/min — ABNORMAL LOW (ref >60)
GFR calc non Af Amer: 29 mL/min — ABNORMAL LOW (ref >60)
Glucose, Bld: 123 mg/dL — ABNORMAL HIGH (ref 70–99)
Potassium: 3.5 mmol/L (ref 3.5–5.1)
Sodium: 137 mmol/L (ref 135–145)

## 2019-11-22 LAB — CBC
HCT: 25.7 % — ABNORMAL LOW (ref 36.0–46.0)
Hemoglobin: 7.8 g/dL — ABNORMAL LOW (ref 12.0–15.0)
MCH: 28.2 pg (ref 26.0–34.0)
MCHC: 30.4 g/dL (ref 30.0–36.0)
MCV: 92.8 fL (ref 80.0–100.0)
Platelets: 284 10*3/uL (ref 150–400)
RBC: 2.77 MIL/uL — ABNORMAL LOW (ref 3.87–5.11)
RDW: 17 % — ABNORMAL HIGH (ref 11.5–15.5)
WBC: 4.6 10*3/uL (ref 4.0–10.5)
nRBC: 0 % (ref 0.0–0.2)

## 2019-11-22 LAB — GLUCOSE, CAPILLARY
Glucose-Capillary: 97 mg/dL (ref 70–99)
Glucose-Capillary: 160 mg/dL — ABNORMAL HIGH (ref 70–99)
Glucose-Capillary: 82 mg/dL (ref 70–99)
Glucose-Capillary: 114 mg/dL — ABNORMAL HIGH (ref 70–99)

## 2019-11-22 MED ORDER — DEXCOM G6 SENSOR MISC: 3 refills | Status: DC

## 2019-11-22 MED ORDER — DEXCOM G6 RECEIVER DEVI: 0 refills | Status: DC

## 2019-11-22 MED ORDER — DEXCOM G6 TRANSMITTER MISC: 3 refills | Status: DC

## 2019-11-22 NOTE — Progress Notes (Signed)
Pt refused to wear cpap tonight. RT will continue to monitor.

## 2019-11-22 NOTE — Telephone Encounter (Signed)
Noted patient is in the hospital. Hospital liaison team and primary Palliative team updated

## 2019-11-22 NOTE — Telephone Encounter (Signed)
Clair Gulling calling from [employer] is calling to request an order for dexcom G6. Order was received the wrong product was marked for the wrong product. CB- [phone] Fax- [phone]

## 2019-11-22 NOTE — Progress Notes (Signed)
PROGRESS NOTE    Kara Hanson  OQH:[id number] DOB: January 27, 1956 DOA: 11/19/2019 PCP: [name], MD   Brief Narrative:  HPI On 11/19/2019 by Dr. Fuller Plan Kara Hanson is a 64 y.o. female with medical history significant of hypertension, systolic congestive heart failure (last EF 20-25%), insulin-dependent diabetes mellitus, chronic hypoxic respiratory failure on 4 L of nasal cannula oxygen, pneumonia due to COVID-19/2020, chronic kidney disease stage III presents with complaints of being unable woken up out of her sleep unable to breathe. Reported associated symptoms of chest tightness, epigastric abdominal discomfort, abdominal distention, and nausea. She has been compliant with all of her home medications. Denies any fever, chills, cough, weight gain, leg swelling, vomiting, dysuria, diarrhea, or constipation. She reports that is hard to take deep inspiratory breath. Her last bowel movement was yesterday.  EMS noted that the patient was satting 76% on home oxygen of 4 L and improved after being placed on a nonrebreather.  Interim history Admitted with acute hypoxic respiratory failure secondary to CHF exacerbation.  Cardiology consulted, currently on IV Lasix.  Assessment & Plan   Acute respiratory failure with hypoxia secondary to Acute combined systolic and diastolic CHF exacerbation -Patient continues to require 7 L of oxygen (usually on 4 L at home)- will attempt to wean as possible  -Patient was noted to be hypoxic on admission -BNP 863.1; chest x-ray revealed pulmonary edema with bilateral effusions -Of note patient was recently hospitalized for CHF exacerbation back in June 2021 -Echocardiogram 09/27/2019 showed an EF of 5465%, grade 3 diastolic dysfunction -Patient recently had undergone right heart catheterization showing elevated biventricular filling pressures -Currently on IV Lasix 80 mg twice daily -continue coreg -Continue to monitor intake and output, daily  weights -Cardiology consulted and appreciated   Abdominal pain with diarrhea -Continues to complain of abdominal pain and states she had "a lot of diarrhea today" -CT abdomen pelvis showed focal acute uncomplicated diverticulitis of the proximal descending colon, moderate bilateral pleural effusions with dense areas of consolidation at lung bases -C diff unremarkable  -Was placed on IV ceftriaxone and flagyl -Abdominal x-ray obtained today showing no obstruction or free air.  Moderate stool. -continue imodium PRN  Diabetes mellitus type 2, uncontrolled with hyperglycemia and peripheral neuropathy -Presented with initial glucose of 180  -Last hemoglobin A1c 7.1 on 09/26/2019  -Home medications including glargine 5 units daily and sliding scale with meals. -Continue gabapentin for neuropathy -Continue Lantus, insulin sliding scale and CBG monitoring  Essential hypertension -Continue hydralazine, Lasix -Coreg restarted today  Chronic kidney disease stage IV -Creatinine currently 1.80 and appears to be stable -Continue to monitor closely given diuresis   Anemia of chronic disease/normocytic anemia  -Hemoglobin dropped to 7.8 today (baseline 8-9) -anemia panel obtained showing iron of 47, ferritin 246, saturation ratio 24 -FOBT negative -Continue to monitor CBC, if hemoglobin drops below 7, will transfuse  Hyperlipidemia  -Continue statin  Deconditioning -PT recommended home health -OT consult pending  DVT Prophylaxis Heparin  Code Status: Full  Family Communication: None at bedside  Disposition Plan:  Status is: Inpatient  Remains inpatient appropriate because:Inpatient level of care appropriate due to severity of illness. Pending improvement in respiratory status and cardiology recommendations. Continues to be on IV lasix.    Dispo: The patient is from: Home              Anticipated d/c is to: Home              Anticipated d/c date is: 3  days              Patient  currently is not medically stable to d/c.  Consultants Cardiology   Procedures  None  Antibiotics   Anti-infectives (From admission, onward)   Start     Dose/Rate Route Frequency Ordered Stop   11/19/19 1830  cefTRIAXone (ROCEPHIN) 2 g in sodium chloride 0.9 % 100 mL IVPB        2 g 200 mL/hr over 30 Minutes Intravenous Every 24 hours 11/19/19 1807     11/19/19 1830  metroNIDAZOLE (FLAGYL) IVPB 500 mg        500 mg 100 mL/hr over 60 Minutes Intravenous Every 8 hours 11/19/19 1807        Subjective:   Kara Hanson seen and examined today.  Feels shortness of breath is mildly improved although continues to be on 7 L of oxygen.  Denies any further loose bowel movements.  Denies current abdominal pain, chest pain, nausea or vomiting, dizziness or headache.   Objective:   Vitals:   11/22/19 0351 11/22/19 0747 11/22/19 1001 11/22/19 1107  BP: 129/77 131/69  (!) 111/57  Pulse: 82 76  76  Resp: 18 18  17   Temp: 98.2 F (36.8 C) (!) 97.4 F (36.3 C)  97.7 F (36.5 C)  TempSrc: Oral Oral  Oral  SpO2: 99% 100% 99% 90%  Weight: 62.9 kg     Height:        Intake/Output Summary (Last 24 hours) at 11/22/2019 1230 Last data filed at 11/22/2019 1006 Gross per 24 hour  Intake 3 ml  Output --  Net 3 ml   Filed Weights   11/19/19 2333 11/21/19 0414 11/22/19 0351  Weight: 64.1 kg 63.5 kg 62.9 kg   Exam  General: Well developed, well nourished, NAD, appears stated age  68: NCAT, mucous membranes moist.   Cardiovascular: S1 S2 auscultated, RRR, no murmur  Respiratory: Diminished breath sounds, no wheezing  Abdomen: Soft, nontender, nondistended, + bowel sounds  Extremities: warm dry without cyanosis clubbing or edema  Neuro: AAOx3, nonfocal  Psych: appropriate mood and affect  Data Reviewed: I have personally reviewed following labs and imaging studies  CBC: Recent Labs  Lab 11/19/19 0510 11/19/19 0958 11/21/19 0439 11/21/19 1929 11/22/19 0430  WBC 7.7  --   5.3  --  4.6  HGB 8.4* 8.2* 7.5* 8.4* 7.8*  HCT 27.4* 24.0* 25.2* 28.2* 25.7*  MCV 93.8  --  93.7  --  92.8  PLT 306  --  287  --  371   Basic Metabolic Panel: Recent Labs  Lab 11/19/19 0510 11/19/19 0958 11/20/19 0644 11/21/19 0439 11/22/19 0430  NA 137 137 139 138 137  K 3.7 3.9 3.9 3.8 3.5  CL 101  --  105 103 103  CO2 26  --  24 25 24   GLUCOSE 180*  --  138* 96 123*  BUN 61*  --  63* 58* 53*  CREATININE 1.95*  --  1.92* 1.96* 1.80*  CALCIUM 9.6  --  9.4 9.3 9.2   GFR: Estimated Creatinine Clearance: 26.2 mL/min (A) (by C-G formula based on SCr of 1.8 mg/dL (H)). Liver Function Tests: Recent Labs  Lab 11/19/19 2217  AST 12*  ALT 12  ALKPHOS 80  BILITOT 0.4  PROT 6.1*  ALBUMIN 2.7*   Recent Labs  Lab 11/19/19 2217  LIPASE 23   No results for input(s): AMMONIA in the last 168 hours. Coagulation Profile: No results for  input(s): INR, PROTIME in the last 168 hours. Cardiac Enzymes: No results for input(s): CKTOTAL, CKMB, CKMBINDEX, TROPONINI in the last 168 hours. BNP (last 3 results) No results for input(s): PROBNP in the last 8760 hours. HbA1C: No results for input(s): HGBA1C in the last 72 hours. CBG: Recent Labs  Lab 11/21/19 1132 11/21/19 1742 11/21/19 2145 11/22/19 0746 11/22/19 1105  GLUCAP 161* 118* 137* 97 160*   Lipid Profile: No results for input(s): CHOL, HDL, LDLCALC, TRIG, CHOLHDL, LDLDIRECT in the last 72 hours. Thyroid Function Tests: No results for input(s): TSH, T4TOTAL, FREET4, T3FREE, THYROIDAB in the last 72 hours. Anemia Panel: Recent Labs    11/21/19 0852  VITAMINB12 649  FOLATE 30.0  FERRITIN 246  TIBC 199*  IRON 47  RETICCTPCT 3.8*   Urine analysis:    Component Value Date/Time   COLORURINE AMBER (A) 09/26/2019 1645   APPEARANCEUR CLOUDY (A) 09/26/2019 1645   LABSPEC 1.014 09/26/2019 1645   PHURINE 5.0 09/26/2019 1645   GLUCOSEU 50 (A) 09/26/2019 1645   HGBUR SMALL (A) 09/26/2019 1645   BILIRUBINUR NEGATIVE  09/26/2019 1645   BILIRUBINUR negative 12/13/2017 1514   KETONESUR NEGATIVE 09/26/2019 1645   PROTEINUR >=300 (A) 09/26/2019 1645   UROBILINOGEN 0.2 12/13/2017 1514   NITRITE NEGATIVE 09/26/2019 1645   LEUKOCYTESUR SMALL (A) 09/26/2019 1645   Sepsis Labs: @LABRCNTIP (procalcitonin:4,lacticidven:4)  ) Recent Results (from the past 240 hour(s))  SARS Coronavirus 2 by RT PCR (hospital order, performed in [place] hospital lab) Nasopharyngeal Nasopharyngeal Swab     Status: None   Collection Time: 11/19/19  4:39 AM   Specimen: Nasopharyngeal Swab  Result Value Ref Range Status   SARS Coronavirus 2 NEGATIVE NEGATIVE Final    Comment: (NOTE) SARS-CoV-2 target nucleic acids are NOT DETECTED.  The SARS-CoV-2 RNA is generally detectable in upper and lower respiratory specimens during the acute phase of infection. The lowest concentration of SARS-CoV-2 viral copies this assay can detect is 250 copies / mL. A negative result does not preclude SARS-CoV-2 infection and should not be used as the sole basis for treatment or other patient management decisions.  A negative result may occur with improper specimen collection / handling, submission of specimen other than nasopharyngeal swab, presence of viral mutation(s) within the areas targeted by this assay, and inadequate number of viral copies (<250 copies / mL). A negative result must be combined with clinical observations, patient history, and epidemiological information.  Fact Sheet for Patients:   StrictlyIdeas.no  Fact Sheet for Healthcare Providers: BankingDealers.co.za  This test is not yet approved or  cleared by the [place] FDA and has been authorized for detection and/or diagnosis of SARS-CoV-2 by FDA under an Emergency Use Authorization (EUA).  This EUA will remain in effect (meaning this test can be used) for the duration of the COVID-19 declaration under Section 564(b)(1)  of the Act, 21 U.S.C. section 360bbb-3(b)(1), unless the authorization is terminated or revoked sooner.  Performed at [facility], [place] [address], [place], [place] [address]   C Difficile Quick Screen w PCR reflex     Status: Abnormal   Collection Time: 11/21/19  1:36 PM   Specimen: STOOL  Result Value Ref Range Status   C Diff antigen POSITIVE (A) NEGATIVE Final   C Diff toxin NEGATIVE NEGATIVE Final   C Diff interpretation Results are indeterminate. See PCR results.  Final    Comment: Performed at [facility], [place] [address], [place], [place] [address]  C. Diff by PCR, Reflexed     Status: None   Collection Time: 11/21/19  1:36 PM  Result Value Ref Range Status   Toxigenic C. Difficile by PCR NEGATIVE NEGATIVE Final    Comment: Patient is colonized with non toxigenic C. difficile. May not need treatment unless significant symptoms are present. Performed at [facility], [place] [address] [place], [place] [address]       Radiology Studies: No results found.   Scheduled Meds: . aspirin EC  81 mg Oral q AM  . atorvastatin  80 mg Oral QHS  . carvedilol  3.125 mg Oral BID WC  . DULoxetine  30 mg Oral QHS  . fluticasone  2 spray Each Nare Daily  . furosemide  80 mg Intravenous BID  . gabapentin  100 mg Oral BID  . heparin  5,000 Units Subcutaneous Q8H  . hydrALAZINE  12.5 mg Oral TID  . insulin aspart  0-9 Units Subcutaneous TID WC  . insulin glargine  5 Units Subcutaneous Daily  . pantoprazole  20 mg Oral Daily  . primidone  50 mg Oral BID  . sodium chloride flush  3 mL Intravenous Q12H   Continuous Infusions: . sodium chloride    . cefTRIAXone (ROCEPHIN)  IV 2 g (11/21/19 2243)  . metronidazole 500 mg (11/22/19 0959)     LOS: 3 days   Time Spent in minutes   30 minutes  Mariha Sleeper D.O. on 11/22/2019 at 12:30 PM  Between 7am to 7pm - Please see pager noted on [email or web address]  After 7pm go to [email or web address]  And look for the night coverage  person covering for me after hours  [facility] Office  [phone]

## 2019-11-22 NOTE — Progress Notes (Signed)
Progress Note  Patient Name: Kara Hanson Date of Encounter: 11/22/2019  [facility] Cardiologist: Candee Furbish, MD / Dr. Haroldine Laws  Subjective   Still having epigastric upper abdominal discomfort.  Mild this AM. Better.   Inpatient Medications    Scheduled Meds: . aspirin EC  81 mg Oral q AM  . atorvastatin  80 mg Oral QHS  . carvedilol  3.125 mg Oral BID WC  . DULoxetine  30 mg Oral QHS  . fluticasone  2 spray Each Nare Daily  . furosemide  80 mg Intravenous BID  . gabapentin  100 mg Oral BID  . heparin  5,000 Units Subcutaneous Q8H  . hydrALAZINE  12.5 mg Oral TID  . insulin aspart  0-9 Units Subcutaneous TID WC  . insulin glargine  5 Units Subcutaneous Daily  . pantoprazole  20 mg Oral Daily  . primidone  50 mg Oral BID  . sodium chloride flush  3 mL Intravenous Q12H   Continuous Infusions: . sodium chloride    . cefTRIAXone (ROCEPHIN)  IV 2 g (11/21/19 2243)  . metronidazole 500 mg (11/22/19 0400)   PRN Meds: sodium chloride, acetaminophen, guaiFENesin-dextromethorphan, loperamide, ondansetron (ZOFRAN) IV, sodium chloride flush   Vital Signs    Vitals:   11/21/19 1648 11/21/19 2052 11/21/19 2351 11/22/19 0351  BP: 138/76 127/74 137/71 129/77  Pulse:  82 78 82  Resp:  18 17 18   Temp:  98.1 F (36.7 C) 98 F (36.7 C) 98.2 F (36.8 C)  TempSrc:  Oral Oral Oral  SpO2:  100% 100% 99%  Weight:    62.9 kg  Height:       No intake or output data in the 24 hours ending 11/22/19 0647 Last 3 Weights 11/22/2019 11/21/2019 11/19/2019  Weight (lbs) 138 lb 10.4 oz 139 lb 14.4 oz 141 lb 4.8 oz  Weight (kg) 62.892 kg 63.458 kg 64.093 kg      Telemetry    Sinus rhythm no adverse arrhythmias- Personally Reviewed  ECG    No new- Personally Reviewed  Physical Exam   GEN: Well nourished, well developed, in no acute distress  HEENT: normal  Neck: no JVD, carotid bruits, or masses Cardiac: RRR; no murmurs, rubs, or gallops,no edema  Respiratory:  clear to  auscultation bilaterally, normal work of breathing GI: soft, nontender, nondistended, + BS MS: no deformity or atrophy  Skin: warm and dry, no rash Neuro:  Alert and Oriented x 3, Strength and sensation are intact Psych: euthymic mood, full affect   Labs    High Sensitivity Troponin:   Recent Labs  Lab 11/19/19 0510  TROPONINIHS 21*      Chemistry Recent Labs  Lab 11/19/19 0510 11/19/19 2217 11/20/19 0644 11/21/19 0439 11/22/19 0430  NA   < >  --  139 138 137  K   < >  --  3.9 3.8 3.5  CL   < >  --  105 103 103  CO2   < >  --  24 25 24   GLUCOSE   < >  --  138* 96 123*  BUN   < >  --  63* 58* 53*  CREATININE   < >  --  1.92* 1.96* 1.80*  CALCIUM   < >  --  9.4 9.3 9.2  PROT  --  6.1*  --   --   --   ALBUMIN  --  2.7*  --   --   --   AST  --  12*  --   --   --   ALT  --  12  --   --   --   ALKPHOS  --  80  --   --   --   BILITOT  --  0.4  --   --   --   GFRNONAA   < >  --  27* 26* 29*  GFRAA   < >  --  31* 31* 34*  ANIONGAP   < >  --  10 10 10    < > = values in this interval not displayed.     Hematology Recent Labs  Lab 11/19/19 0510 11/19/19 0958 11/21/19 0439 11/21/19 0852 11/21/19 1929 11/22/19 0430  WBC 7.7  --  5.3  --   --  4.6  RBC 2.92*  --  2.69* 2.97*  --  2.77*  HGB 8.4*   < > 7.5*  --  8.4* 7.8*  HCT 27.4*   < > 25.2*  --  28.2* 25.7*  MCV 93.8  --  93.7  --   --  92.8  MCH 28.8  --  27.9  --   --  28.2  MCHC 30.7  --  29.8*  --   --  30.4  RDW 16.9*  --  17.1*  --   --  17.0*  PLT 306  --  287  --   --  284   < > = values in this interval not displayed.    BNP Recent Labs  Lab 11/19/19 0510  BNP 863.1*     DDimer No results for input(s): DDIMER in the last 168 hours.   Radiology    DG Abd 1 View  Result Date: 11/20/2019 CLINICAL DATA:  Abdominal pain with diarrhea EXAM: ABDOMEN - 1 VIEW COMPARISON:  CT abdomen and pelvis November 19, 2019 FINDINGS: There is moderate stool in the colon. There is no bowel dilatation or air-fluid  level to suggest bowel obstruction. No free air. There are surgical clips in the gallbladder fossa region. There are small apparent phleboliths in the pelvis. IMPRESSION: No bowel obstruction or free air evident.  Moderate stool in colon. Electronically Signed   By: Lowella Grip III M.D.   On: 11/20/2019 09:57    Cardiac Studies   Right Heart Catheterization 09/30/2019: Findings: RA = 13 RV = 63/16 PA = 62/23 (40) PCW = 27 (v=43) Fick cardiac output/index = 4.8/2.9 Thermo CO/CI = 2.3/1.4 PVR = 5.7 WU (Thermo) Ao sat = 95% PA sat = 54%, 56%  Assessment: 1. Markedly elevated biventricular filling pressures  Echocardiogram 09/27/2019: Impressions: 1. Left ventricular ejection fraction, by estimation, is 20 to 25%. The  left ventricle has severely decreased function. The left ventricle  demonstrates global hypokinesis. The left ventricular internal cavity size  was mildly dilated. There is mild  asymmetric left ventricular hypertrophy of the inferolateral segment. Left  ventricular diastolic parameters are consistent with Grade III diastolic  dysfunction (restrictive). Elevated left ventricular end-diastolic  pressure.  2. Right ventricular systolic function is normal. The right ventricular  size is normal. There is mildly elevated pulmonary artery systolic  pressure. The estimated right ventricular systolic pressure is 19.5 mmHg.  3. Left atrial size was moderately dilated.  4. The mitral valve is grossly normal. Mild mitral valve regurgitation  due to LV dysfunction with leaflet tenting.  5. Tricuspid valve regurgitation is moderate.  6. The aortic valve is abnormal. AV gradient may be underestimated due to  LV dysfunction.  7. The inferior vena cava is normal in size with <50% respiratory  variability, suggesting right atrial pressure of 8 mmHg.   Patient Profile     64 y.o. female with a history of mild CAD on cath in 02/2019, chronic combined CHF with LVEF of  20-25% on Echo in 09/2019, chronic respiratory failure on 4L of O2 at home, prior CVA, hypertension, hyperlipidemia, type 2 diabetes on insulin, CKD stage IV, chronic anemia, and COVID pneumonia in 07/2018 who is being seen for CHF   Assessment & Plan    Acute on chronic combined systolic/diastolic heart failure, nonischemic cardiomyopathy, biventricular failure-CKD 4 -She has had complete work-ups previously right and left heart cath with advanced heart failure team.  Previous hospitalization was on milrinone IV which was eventually weaned.  Discussions with palliative care team ensued. -Continue with IV Lasix 80 mg IV twice daily. Creat is stable -Continue with hydralazine 12.5 3 times daily.  She has not been able to tolerate other medications. -Restarted low dose coreg yesterday --Wt is down from 141 to 138  Epigastric discomfort yesterday --No CAD on Cath prior --GI related  Abdominal pain--main complaint -CT scan, potential inflammatory changes suggestive of proximal descending diverticulitis noted.  She is being treated with antibiotics. Neg CDIFF     For questions or updates, please contact [place] Please consult [email or web address] for contact info under        Signed, Candee Furbish, MD  11/22/2019, 6:47 AM

## 2019-11-22 NOTE — Evaluation (Signed)
Occupational Therapy Evaluation Patient Details Name: Kara Hanson MRN: [id number] DOB: 1955/04/15 Today's Date: 11/22/2019    History of Present Illness 64 y.o. female with a history of mild CAD on cath in 02/2019, chronic combined CHF with LVEF of 20-25% on Echo in 09/2019, chronic respiratory failure on 4L of O2 at home, prior CVA, hypertension, hyperlipidemia, type 2 diabetes on insulin, CKD stage IV, chronic anemia, and COVID pneumonia in 07/2018. Brought to ED by EMS and was satting 76%O2 at home on 4L O2 improved on nonrebreather. In ED weaned to 6L via [place]. Chest x-ray showed cardiomegaly with pulmonary edema and bilateral effusions. Admitted 11/19/19 for treatment of CHF.   Clinical Impression   PTA pt living with family and functioning at mod I level on 4L [place] since being diagnosed with COVID this past year. At time of eval, pt presents with ability to complete bed mobility and sit <> stands at min guard assist. She was then able to complete functional mobility to the sink with RW and remain standing for grooming tasks ~10 mins with SpO2 stable at 6L [place]. Pt then completed functional mobility a household distance in the hallway (without a seated rest break after standing grooming tasks) at min guard level. At end of session, pt reports BLEs are very weak and tremulous. Educated pt to continue mobilizing with Conservation officer, historic buildings. Given current status, recommend HHOT to support safety, BADL engagement, and independent PLOF. OT will continue to follow per POC listed below.    Follow Up Recommendations  Home health OT    Equipment Recommendations  None recommended by OT    Recommendations for Other Services       Precautions / Restrictions Precautions Precautions: Fall Restrictions Weight Bearing Restrictions: No      Mobility Bed Mobility Overal bed mobility: Needs Assistance Bed Mobility: Supine to Sit     Supine to sit: Min guard     General bed mobility comments: increased time and  effort  Transfers Overall transfer level: Needs assistance Equipment used: 1 person hand held assist Transfers: Sit to/from Stand;Stand Pivot Transfers Sit to Stand: Min guard Stand pivot transfers: Min guard       General transfer comment: assist to guard for safety    Balance Overall balance assessment: Needs assistance Sitting-balance support: Feet supported;Bilateral upper extremity supported Sitting balance-Leahy Scale: Poor     Standing balance support: Bilateral upper extremity supported;During functional activity Standing balance-Leahy Scale: Fair                             ADL either performed or assessed with clinical judgement   ADL Overall ADL's : Needs assistance/impaired Eating/Feeding: Set up;Sitting   Grooming: Supervision/safety;Standing Grooming Details (indicate cue type and reason): pt standing at sink using BUEs to brush hair. Able to statically stand without external support Upper Body Bathing: Minimal assistance;Sitting   Lower Body Bathing: Minimal assistance;Sit to/from stand;Sitting/lateral leans   Upper Body Dressing : Minimal assistance;Sitting   Lower Body Dressing: Minimal assistance;Sit to/from stand;Sitting/lateral leans   Toilet Transfer: Min guard;Ambulation;RW   Toileting- Clothing Manipulation and Hygiene: Set up;Sitting/lateral lean;Sit to/from stand       Functional mobility during ADLs: Min guard;Rolling walker       Vision Baseline Vision/History: No visual deficits Patient Visual Report: No change from baseline       Perception     Praxis      Pertinent Vitals/Pain Pain Assessment: No/denies pain  Hand Dominance     Extremity/Trunk Assessment Upper Extremity Assessment Upper Extremity Assessment: Generalized weakness   Lower Extremity Assessment Lower Extremity Assessment: Generalized weakness       Communication Communication Communication: No difficulties   Cognition  Arousal/Alertness: Awake/alert Behavior During Therapy: WFL for tasks assessed/performed;Flat affect Overall Cognitive Status: Within Functional Limits for tasks assessed                                     General Comments  6L [place] to keep SpO2 >90 in session    Exercises     Shoulder Instructions      Home Living Family/patient expects to be discharged to:: Private residence Living Arrangements: Children Available Help at Discharge: Family;Available 24 hours/day;Personal care attendant Type of Home: House Home Access: Level entry     Home Layout: One level     Bathroom Shower/Tub: Teacher, early years/pre: Standard     Home Equipment: Environmental consultant - 2 wheels;Tub bench   Additional Comments: has HHA to start soon, but has not started yet      Prior Functioning/Environment Level of Independence: Needs assistance  Gait / Transfers Assistance Needed: uses RW at home needs a scooter at the store ADL's / Homemaking Assistance Needed: requires min A in ADL/iADLs   Comments: Uses 4L of O2 via [place] at home         OT Problem List: Decreased strength;Decreased knowledge of use of DME or AE;Decreased knowledge of precautions;Decreased activity tolerance;Cardiopulmonary status limiting activity;Impaired balance (sitting and/or standing)      OT Treatment/Interventions: Self-care/ADL training;Therapeutic exercise;Patient/family education;Balance training;Energy conservation;Therapeutic activities;DME and/or AE instruction    OT Goals(Current goals can be found in the care plan section) Acute Rehab OT Goals Patient Stated Goal: return to independence OT Goal Formulation: With patient Time For Goal Achievement: 12/06/19 Potential to Achieve Goals: Good  OT Frequency: Min 2X/week   Barriers to D/C:            Co-evaluation              AM-PAC OT "6 Clicks" Daily Activity     Outcome Measure Help from another person eating meals?: A Little Help  from another person taking care of personal grooming?: A Little Help from another person toileting, which includes using toliet, bedpan, or urinal?: A Little Help from another person bathing (including washing, rinsing, drying)?: A Little Help from another person to put on and taking off regular upper body clothing?: A Little Help from another person to put on and taking off regular lower body clothing?: A Little 6 Click Score: 18   End of Session Equipment Utilized During Treatment: Gait belt;Oxygen;Rolling walker Nurse Communication: Mobility status  Activity Tolerance: Patient tolerated treatment well Patient left: in chair;with call bell/phone within reach  OT Visit Diagnosis: Unsteadiness on feet (R26.81);Other abnormalities of gait and mobility (R26.89);Muscle weakness (generalized) (M62.81)                Time: 1610-9604 OT Time Calculation (min): 29 min Charges:  OT General Charges $OT Visit: 1 Visit OT Evaluation $OT Eval Moderate Complexity: 1 Mod OT Treatments $Self Care/Home Management : 8-22 mins  Zenovia Jarred, MSOT, OTR/L Acute Rehabilitation Services [facility] Office Number: [phone] Pager: [phone]  Zenovia Jarred 11/22/2019, 5:57 PM

## 2019-11-22 NOTE — Telephone Encounter (Signed)
Scripts has been printed and will be faxed over once PCP signs off

## 2019-11-23 LAB — BASIC METABOLIC PANEL
Anion gap: 10 (ref 5–15)
BUN: 51 mg/dL — ABNORMAL HIGH (ref 8–23)
CO2: 24 mmol/L (ref 22–32)
Calcium: 9.4 mg/dL (ref 8.9–10.3)
Chloride: 105 mmol/L (ref 98–111)
Creatinine, Ser: 1.93 mg/dL — ABNORMAL HIGH (ref 0.44–1.00)
GFR calc Af Amer: 31 mL/min — ABNORMAL LOW (ref >60)
GFR calc non Af Amer: 27 mL/min — ABNORMAL LOW (ref >60)
Glucose, Bld: 85 mg/dL (ref 70–99)
Potassium: 3.5 mmol/L (ref 3.5–5.1)
Sodium: 139 mmol/L (ref 135–145)

## 2019-11-23 LAB — LACTIC ACID, PLASMA: Lactic Acid, Venous: 0.5 mmol/L (ref 0.5–1.9)

## 2019-11-23 LAB — GLUCOSE, CAPILLARY
Glucose-Capillary: 81 mg/dL (ref 70–99)
Glucose-Capillary: 199 mg/dL — ABNORMAL HIGH (ref 70–99)
Glucose-Capillary: 127 mg/dL — ABNORMAL HIGH (ref 70–99)
Glucose-Capillary: 156 mg/dL — ABNORMAL HIGH (ref 70–99)

## 2019-11-23 LAB — BRAIN NATRIURETIC PEPTIDE: B Natriuretic Peptide: 867.6 pg/mL — ABNORMAL HIGH (ref 0.0–100.0)

## 2019-11-23 LAB — HEMOGLOBIN AND HEMATOCRIT, BLOOD
HCT: 26.7 % — ABNORMAL LOW (ref 36.0–46.0)
Hemoglobin: 8.1 g/dL — ABNORMAL LOW (ref 12.0–15.0)

## 2019-11-23 NOTE — Progress Notes (Addendum)
PROGRESS NOTE    Kara Hanson  QHU:[id number] DOB: 11-25-1955 DOA: 11/19/2019 PCP: [name], MD   Brief Narrative:  HPI On 11/19/2019 by Dr. Fuller Plan Kara Hanson is a 64 y.o. female with medical history significant of hypertension, systolic congestive heart failure (last EF 20-25%), insulin-dependent diabetes mellitus, chronic hypoxic respiratory failure on 4 L of nasal cannula oxygen, pneumonia due to COVID-19/2020, chronic kidney disease stage III presents with complaints of being unable woken up out of her sleep unable to breathe. Reported associated symptoms of chest tightness, epigastric abdominal discomfort, abdominal distention, and nausea. She has been compliant with all of her home medications. Denies any fever, chills, cough, weight gain, leg swelling, vomiting, dysuria, diarrhea, or constipation. She reports that is hard to take deep inspiratory breath. Her last bowel movement was yesterday.  EMS noted that the patient was satting 76% on home oxygen of 4 L and improved after being placed on a nonrebreather.  Interim history Admitted with acute hypoxic respiratory failure secondary to CHF exacerbation.  Cardiology consulted, currently on IV Lasix.  Assessment & Plan   Acute respiratory failure with hypoxia secondary to Acute combined systolic and diastolicCHF exacerbation -Patient continues to require 7 L of oxygen (usually on 4 L at home)- will attempt to wean as possible  -Patient was noted to be hypoxic on admission -BNP 863.1; chest x-ray revealed pulmonary edema with bilateral effusions -Of note patient was recently hospitalized for CHF exacerbation back in June 2021 -Echocardiogram 09/27/2019 showed an EF of 4656%, grade 3 diastolic dysfunction -Patient recently had undergone right heart catheterization showing elevated biventricular filling pressures -Currently on IV Lasix 80 mg twice daily -Coreg held by cardiology -Continue to monitor intake and  output, daily weights -Cardiology consulted and appreciated- checking lactate and feels patient may need PICC to monitor Co-ox/CVP  Abdominal pain with diarrhea -Continues to complain of abdominal pain and states had loose stool yesterday -CT abdomen pelvis showed focal acute uncomplicated diverticulitis of the proximal descending colon, moderate bilateral pleural effusions with dense areas of consolidation at lung bases -C diff unremarkable  -Was placed on IV ceftriaxone and flagyl -Abdominal x-ray obtained today showing no obstruction or free air.  Moderate stool. -continue imodium PRN  Diabetes mellitus type 2, uncontrolled with hyperglycemia and peripheral neuropathy -Presented with initial glucose of 180  -Last hemoglobin A1c 7.1 on 09/26/2019  -Home medications including glargine 5 units daily and sliding scale with meals. -Continue gabapentin for neuropathy -Continue Lantus, insulin sliding scale and CBG monitoring  Essential hypertension -Continue hydralazine, Lasix -Coreg held by cardiology  Chronic kidney disease stage IV -Creatinine currently 1.80 and appears to be stable -Continue to monitor closely given diuresis  Anemia of chronic disease/normocytic anemia  -Hemoglobin dropped to 8.1 today (baseline 8-9) -anemia panel obtained showing iron of 47, ferritin 246, saturation ratio 24 -FOBT negative -Continue to monitor CBC, if hemoglobin drops below 7, will transfuse  Hyperlipidemia -Continue statin  Deconditioning -PT and OT recommended home health  DVT Prophylaxis Heparin  Code Status: Full  Family Communication: None at bedside  Disposition Plan:  Status is: Inpatient  Remains inpatient appropriate because:Inpatient level of care appropriate due to severity of illness. Pending improvement in respiratory status and cardiology recommendations. Continues to be on IV lasix.    Dispo: The patient is from: Home              Anticipated d/c is to: Home  with home health  Anticipated d/c date is: 2 days              Patient currently is not medically stable to d/c.  Consultants Cardiology   Procedures  None  Antibiotics   Anti-infectives (From admission, onward)   Start     Dose/Rate Route Frequency Ordered Stop   11/19/19 1830  cefTRIAXone (ROCEPHIN) 2 g in sodium chloride 0.9 % 100 mL IVPB        2 g 200 mL/hr over 30 Minutes Intravenous Every 24 hours 11/19/19 1807     11/19/19 1830  metroNIDAZOLE (FLAGYL) IVPB 500 mg        500 mg 100 mL/hr over 60 Minutes Intravenous Every 8 hours 11/19/19 1807        Subjective:   Kara Hanson seen and examined today.  Feels shortness of breath is improved.  Continues to complain of abdominal pain and states she had a loose bowel movement yesterday.  Denies current chest pain, nausea vomiting, dizziness or headache.  Objective:   Vitals:   11/23/19 0429 11/23/19 0718 11/23/19 0728 11/23/19 1133  BP: 121/81  (!) 147/72 (!) 107/56  Pulse: 72  81 79  Resp: 18     Temp: 97.9 F (36.6 C)  97.7 F (36.5 C) 98.2 F (36.8 C)  TempSrc: Oral  Oral Oral  SpO2: 98% 100% 97% 98%  Weight: 64.4 kg     Height:        Intake/Output Summary (Last 24 hours) at 11/23/2019 1326 Last data filed at 11/22/2019 2000 Gross per 24 hour  Intake 278.42 ml  Output --  Net 278.42 ml   Filed Weights   11/21/19 0414 11/22/19 0351 11/23/19 0429  Weight: 63.5 kg 62.9 kg 64.4 kg   Exam  General: Well developed, well nourished, NAD, appears stated age  34: NCAT, mucous membranes moist.   Cardiovascular: S1 S2 auscultated, RRR, no murmur  Respiratory: Diminished breath sounds, basilar crackles  Abdomen: Soft, nontender, nondistended, + bowel sounds  Extremities: warm dry without cyanosis clubbing or edema  Neuro: AAOx3,nonfocal  Psych: Appropriate mood and affect   Data Reviewed: I have personally reviewed following labs and imaging studies  CBC: Recent Labs  Lab  11/19/19 0510 11/19/19 0510 11/19/19 0958 11/21/19 0439 11/21/19 1929 11/22/19 0430 11/23/19 0201  WBC 7.7  --   --  5.3  --  4.6  --   HGB 8.4*   < > 8.2* 7.5* 8.4* 7.8* 8.1*  HCT 27.4*   < > 24.0* 25.2* 28.2* 25.7* 26.7*  MCV 93.8  --   --  93.7  --  92.8  --   PLT 306  --   --  287  --  284  --    < > = values in this interval not displayed.   Basic Metabolic Panel: Recent Labs  Lab 11/19/19 0510 11/19/19 0510 11/19/19 0958 11/20/19 0644 11/21/19 0439 11/22/19 0430 11/23/19 0201  NA 137   < > 137 139 138 137 139  K 3.7   < > 3.9 3.9 3.8 3.5 3.5  CL 101  --   --  105 103 103 105  CO2 26  --   --  24 25 24 24   GLUCOSE 180*  --   --  138* 96 123* 85  BUN 61*  --   --  63* 58* 53* 51*  CREATININE 1.95*  --   --  1.92* 1.96* 1.80* 1.93*  CALCIUM 9.6  --   --  9.4 9.3 9.2 9.4   < > = values in this interval not displayed.   GFR: Estimated Creatinine Clearance: 24.7 mL/min (A) (by C-G formula based on SCr of 1.93 mg/dL (H)). Liver Function Tests: Recent Labs  Lab 11/19/19 2217  AST 12*  ALT 12  ALKPHOS 80  BILITOT 0.4  PROT 6.1*  ALBUMIN 2.7*   Recent Labs  Lab 11/19/19 2217  LIPASE 23   No results for input(s): AMMONIA in the last 168 hours. Coagulation Profile: No results for input(s): INR, PROTIME in the last 168 hours. Cardiac Enzymes: No results for input(s): CKTOTAL, CKMB, CKMBINDEX, TROPONINI in the last 168 hours. BNP (last 3 results) No results for input(s): PROBNP in the last 8760 hours. HbA1C: No results for input(s): HGBA1C in the last 72 hours. CBG: Recent Labs  Lab 11/22/19 1105 11/22/19 1638 11/22/19 2051 11/23/19 0727 11/23/19 1133  GLUCAP 160* 82 114* 81 199*   Lipid Profile: No results for input(s): CHOL, HDL, LDLCALC, TRIG, CHOLHDL, LDLDIRECT in the last 72 hours. Thyroid Function Tests: No results for input(s): TSH, T4TOTAL, FREET4, T3FREE, THYROIDAB in the last 72 hours. Anemia Panel: Recent Labs    11/21/19 0852   VITAMINB12 649  FOLATE 30.0  FERRITIN 246  TIBC 199*  IRON 47  RETICCTPCT 3.8*   Urine analysis:    Component Value Date/Time   COLORURINE AMBER (A) 09/26/2019 1645   APPEARANCEUR CLOUDY (A) 09/26/2019 1645   LABSPEC 1.014 09/26/2019 1645   PHURINE 5.0 09/26/2019 1645   GLUCOSEU 50 (A) 09/26/2019 1645   HGBUR SMALL (A) 09/26/2019 1645   BILIRUBINUR NEGATIVE 09/26/2019 1645   BILIRUBINUR negative 12/13/2017 1514   KETONESUR NEGATIVE 09/26/2019 1645   PROTEINUR >=300 (A) 09/26/2019 1645   UROBILINOGEN 0.2 12/13/2017 1514   NITRITE NEGATIVE 09/26/2019 1645   LEUKOCYTESUR SMALL (A) 09/26/2019 1645   Sepsis Labs: @LABRCNTIP (procalcitonin:4,lacticidven:4)  ) Recent Results (from the past 240 hour(s))  SARS Coronavirus 2 by RT PCR (hospital order, performed in [place] hospital lab) Nasopharyngeal Nasopharyngeal Swab     Status: None   Collection Time: 11/19/19  4:39 AM   Specimen: Nasopharyngeal Swab  Result Value Ref Range Status   SARS Coronavirus 2 NEGATIVE NEGATIVE Final    Comment: (NOTE) SARS-CoV-2 target nucleic acids are NOT DETECTED.  The SARS-CoV-2 RNA is generally detectable in upper and lower respiratory specimens during the acute phase of infection. The lowest concentration of SARS-CoV-2 viral copies this assay can detect is 250 copies / mL. A negative result does not preclude SARS-CoV-2 infection and should not be used as the sole basis for treatment or other patient management decisions.  A negative result may occur with improper specimen collection / handling, submission of specimen other than nasopharyngeal swab, presence of viral mutation(s) within the areas targeted by this assay, and inadequate number of viral copies (<250 copies / mL). A negative result must be combined with clinical observations, patient history, and epidemiological information.  Fact Sheet for Patients:   StrictlyIdeas.no  Fact Sheet for Healthcare  Providers: BankingDealers.co.za  This test is not yet approved or  cleared by the [place] FDA and has been authorized for detection and/or diagnosis of SARS-CoV-2 by FDA under an Emergency Use Authorization (EUA).  This EUA will remain in effect (meaning this test can be used) for the duration of the COVID-19 declaration under Section 564(b)(1) of the Act, 21 U.S.C. section 360bbb-3(b)(1), unless the authorization is terminated or revoked sooner.  Performed at [facility]  Lab, [address], [place], [place] [address]   C Difficile Quick Screen w PCR reflex     Status: Abnormal   Collection Time: 11/21/19  1:36 PM   Specimen: STOOL  Result Value Ref Range Status   C Diff antigen POSITIVE (A) NEGATIVE Final   C Diff toxin NEGATIVE NEGATIVE Final   C Diff interpretation Results are indeterminate. See PCR results.  Final    Comment: Performed at [facility], [place] [address] [place], [place] [address]  C. Diff by PCR, Reflexed     Status: None   Collection Time: 11/21/19  1:36 PM  Result Value Ref Range Status   Toxigenic C. Difficile by PCR NEGATIVE NEGATIVE Final    Comment: Patient is colonized with non toxigenic C. difficile. May not need treatment unless significant symptoms are present. Performed at [facility], [place] [address] [place], [place] [address]       Radiology Studies: No results found.   Scheduled Meds: . aspirin EC  81 mg Oral q AM  . atorvastatin  80 mg Oral QHS  . DULoxetine  30 mg Oral QHS  . fluticasone  2 spray Each Nare Daily  . furosemide  80 mg Intravenous BID  . gabapentin  100 mg Oral BID  . heparin  5,000 Units Subcutaneous Q8H  . hydrALAZINE  12.5 mg Oral TID  . insulin aspart  0-9 Units Subcutaneous TID WC  . insulin glargine  5 Units Subcutaneous Daily  . pantoprazole  20 mg Oral Daily  . primidone  50 mg Oral BID  . sodium chloride flush  3 mL Intravenous Q12H   Continuous Infusions: . sodium  chloride    . cefTRIAXone (ROCEPHIN)  IV 2 g (11/22/19 2057)  . metronidazole 500 mg (11/23/19 1000)     LOS: 4 days   Time Spent in minutes   30 minutes  Sachi Boulay D.O. on 11/23/2019 at 1:26 PM  Between 7am to 7pm - Please see pager noted on [email or web address]  After 7pm go to [email or web address]  And look for the night coverage person covering for me after hours  [facility] Office  [phone]

## 2019-11-23 NOTE — Progress Notes (Signed)
Patients oxygen was weaned to 4.5L, O2 sats 100% currently.

## 2019-11-23 NOTE — Progress Notes (Signed)
Progress Note  Patient Name: Kara Hanson Date of Encounter: 11/23/2019  [facility] Cardiologist: Candee Furbish, MD / Dr. Haroldine Laws  Subjective   I/Os not recorded.  SpO2 100% on 4L.  Renal function stable (creatinine 1.9 > 2.0 > 1.8 > 1.9).  Weight recorded as elevated from admission (141 >140 >139 > 142 lbs).  Inpatient Medications    Scheduled Meds: . aspirin EC  81 mg Oral q AM  . atorvastatin  80 mg Oral QHS  . carvedilol  3.125 mg Oral BID WC  . DULoxetine  30 mg Oral QHS  . fluticasone  2 spray Each Nare Daily  . furosemide  80 mg Intravenous BID  . gabapentin  100 mg Oral BID  . heparin  5,000 Units Subcutaneous Q8H  . hydrALAZINE  12.5 mg Oral TID  . insulin aspart  0-9 Units Subcutaneous TID WC  . insulin glargine  5 Units Subcutaneous Daily  . pantoprazole  20 mg Oral Daily  . primidone  50 mg Oral BID  . sodium chloride flush  3 mL Intravenous Q12H   Continuous Infusions: . sodium chloride    . cefTRIAXone (ROCEPHIN)  IV 2 g (11/22/19 2057)  . metronidazole 500 mg (11/23/19 1000)   PRN Meds: sodium chloride, acetaminophen, guaiFENesin-dextromethorphan, loperamide, ondansetron (ZOFRAN) IV, sodium chloride flush   Vital Signs    Vitals:   11/23/19 0029 11/23/19 0429 11/23/19 0718 11/23/19 0728  BP: 123/70 121/81  (!) 147/72  Pulse: 77 72  81  Resp: 18 18    Temp: 97.8 F (36.6 C) 97.9 F (36.6 C)  97.7 F (36.5 C)  TempSrc: Oral Oral  Oral  SpO2: 100% 98% 100% 97%  Weight:  64.4 kg    Height:        Intake/Output Summary (Last 24 hours) at 11/23/2019 1132 Last data filed at 11/22/2019 2000 Gross per 24 hour  Intake 278.42 ml  Output --  Net 278.42 ml   Last 3 Weights 11/23/2019 11/22/2019 11/21/2019  Weight (lbs) 142 lb 138 lb 10.4 oz 139 lb 14.4 oz  Weight (kg) 64.411 kg 62.892 kg 63.458 kg      Telemetry    Sinus rhythm no adverse arrhythmias- Personally Reviewed  ECG    No new- Personally Reviewed  Physical Exam   GEN: Well  nourished, well developed, in no acute distress  HEENT: normal  Neck: + JVD, carotid bruits, or masses Cardiac: RRR; no murmurs, rubs, or gallops,no edema  Respiratory:  Basilar crackles GI: soft, nontender, nondistended, + BS MS: no deformity or atrophy  Skin: warm and dry, no rash Neuro:  Alert and Oriented x 3, Strength and sensation are intact Psych: euthymic mood, full affect   Labs    High Sensitivity Troponin:   Recent Labs  Lab 11/19/19 0510  TROPONINIHS 21*      Chemistry Recent Labs  Lab 11/19/19 2217 11/20/19 0644 11/21/19 0439 11/22/19 0430 11/23/19 0201  NA  --    < > 138 137 139  K  --    < > 3.8 3.5 3.5  CL  --    < > 103 103 105  CO2  --    < > 25 24 24   GLUCOSE  --    < > 96 123* 85  BUN  --    < > 58* 53* 51*  CREATININE  --    < > 1.96* 1.80* 1.93*  CALCIUM  --    < > 9.3  9.2 9.4  PROT 6.1*  --   --   --   --   ALBUMIN 2.7*  --   --   --   --   AST 12*  --   --   --   --   ALT 12  --   --   --   --   ALKPHOS 80  --   --   --   --   BILITOT 0.4  --   --   --   --   GFRNONAA  --    < > 26* 29* 27*  GFRAA  --    < > 31* 34* 31*  ANIONGAP  --    < > 10 10 10    < > = values in this interval not displayed.     Hematology Recent Labs  Lab 11/19/19 0510 11/19/19 2500 11/21/19 0439 11/21/19 0439 11/21/19 0852 11/21/19 1929 11/22/19 0430 11/23/19 0201  WBC 7.7  --  5.3  --   --   --  4.6  --   RBC 2.92*  --  2.69*  --  2.97*  --  2.77*  --   HGB 8.4*   < > 7.5*   < >  --  8.4* 7.8* 8.1*  HCT 27.4*   < > 25.2*   < >  --  28.2* 25.7* 26.7*  MCV 93.8  --  93.7  --   --   --  92.8  --   MCH 28.8  --  27.9  --   --   --  28.2  --   MCHC 30.7  --  29.8*  --   --   --  30.4  --   RDW 16.9*  --  17.1*  --   --   --  17.0*  --   PLT 306  --  287  --   --   --  284  --    < > = values in this interval not displayed.    BNP Recent Labs  Lab 11/19/19 0510  BNP 863.1*     DDimer No results for input(s): DDIMER in the last 168 hours.    Radiology    No results found.  Cardiac Studies   Right Heart Catheterization 09/30/2019: Findings: RA = 13 RV = 63/16 PA = 62/23 (40) PCW = 27 (v=43) Fick cardiac output/index = 4.8/2.9 Thermo CO/CI = 2.3/1.4 PVR = 5.7 WU (Thermo) Ao sat = 95% PA sat = 54%, 56%  Assessment: 1. Markedly elevated biventricular filling pressures  Echocardiogram 09/27/2019: Impressions: 1. Left ventricular ejection fraction, by estimation, is 20 to 25%. The  left ventricle has severely decreased function. The left ventricle  demonstrates global hypokinesis. The left ventricular internal cavity size  was mildly dilated. There is mild  asymmetric left ventricular hypertrophy of the inferolateral segment. Left  ventricular diastolic parameters are consistent with Grade III diastolic  dysfunction (restrictive). Elevated left ventricular end-diastolic  pressure.  2. Right ventricular systolic function is normal. The right ventricular  size is normal. There is mildly elevated pulmonary artery systolic  pressure. The estimated right ventricular systolic pressure is 37.0 mmHg.  3. Left atrial size was moderately dilated.  4. The mitral valve is grossly normal. Mild mitral valve regurgitation  due to LV dysfunction with leaflet tenting.  5. Tricuspid valve regurgitation is moderate.  6. The aortic valve is abnormal. AV gradient may be underestimated due to  LV dysfunction.  7. The inferior vena cava is normal in size with <50% respiratory  variability, suggesting right atrial pressure of 8 mmHg.   Patient Profile     64 y.o. female with a history of mild CAD on cath in 02/2019, chronic combined CHF with LVEF of 20-25% on Echo in 09/2019, chronic respiratory failure on 4L of O2 at home, prior CVA, hypertension, hyperlipidemia, type 2 diabetes on insulin, CKD stage IV, chronic anemia, and COVID pneumonia in 07/2018 who is being seen for CHF   Assessment & Plan    Acute on chronic combined  systolic/diastolic heart failure, nonischemic cardiomyopathy, biventricular failure-CKD 4 -She has had complete work-ups previously right and left heart cath with advanced heart failure team.  Previous hospitalization was on milrinone IV which was eventually weaned.  Discussions with palliative care team ensued. -Continue with hydralazine 12.5 3 times daily.  She has not been able to tolerate other medications. -Started coreg this admission, would favor holding for now in setting of decompensated heart failure -Continue with IV Lasix 80 mg IV twice daily. Creat is stable.  Oxygen requirement has improved, though weight is recorded as elevated from admission.  Will check lactate.  Hold beta-blocker and continue diuresis.  If not responding to diuresis may need PICC to check Co-ox/CVP to guide management  Epigastric discomfort  --No CAD on Cath prior --GI related  Abdominal pain--main complaint -CT scan, potential inflammatory changes suggestive of proximal descending diverticulitis noted.  She is being treated with antibiotics. Neg CDIFF     For questions or updates, please contact [place] Please consult [email or web address] for contact info under        Signed, Donato Heinz, MD  11/23/2019, 11:32 AM

## 2019-11-24 LAB — GLUCOSE, CAPILLARY
Glucose-Capillary: 121 mg/dL — ABNORMAL HIGH (ref 70–99)
Glucose-Capillary: 144 mg/dL — ABNORMAL HIGH (ref 70–99)
Glucose-Capillary: 157 mg/dL — ABNORMAL HIGH (ref 70–99)
Glucose-Capillary: 145 mg/dL — ABNORMAL HIGH (ref 70–99)

## 2019-11-24 LAB — BASIC METABOLIC PANEL
Anion gap: 11 (ref 5–15)
BUN: 46 mg/dL — ABNORMAL HIGH (ref 8–23)
CO2: 22 mmol/L (ref 22–32)
Calcium: 8.9 mg/dL (ref 8.9–10.3)
Chloride: 105 mmol/L (ref 98–111)
Creatinine, Ser: 1.8 mg/dL — ABNORMAL HIGH (ref 0.44–1.00)
GFR calc Af Amer: 34 mL/min — ABNORMAL LOW (ref >60)
GFR calc non Af Amer: 29 mL/min — ABNORMAL LOW (ref >60)
Glucose, Bld: 140 mg/dL — ABNORMAL HIGH (ref 70–99)
Potassium: 3.5 mmol/L (ref 3.5–5.1)
Sodium: 138 mmol/L (ref 135–145)

## 2019-11-24 LAB — HEMOGLOBIN AND HEMATOCRIT, BLOOD
HCT: 26.9 % — ABNORMAL LOW (ref 36.0–46.0)
Hemoglobin: 8.1 g/dL — ABNORMAL LOW (ref 12.0–15.0)

## 2019-11-24 MED ORDER — FUROSEMIDE 10 MG/ML IJ SOLN
120.0000 mg | Freq: Two times a day (BID) | INTRAVENOUS | Status: DC
  Administered 2019-11-24: 120 mg via INTRAVENOUS
  Filled 2019-11-24: qty 10
  Filled 2019-11-24 (×3): qty 12

## 2019-11-24 NOTE — Progress Notes (Signed)
PROGRESS NOTE    Kara Hanson  HWE:[id number] DOB: [birth year] DOA: 11/19/2019 PCP: Kara Rakes, MD   Brief Narrative:  HPI On 11/19/2019 by Kara Hanson is a 64 y.o. female with medical history significant of hypertension, systolic congestive heart failure (last EF 20-25%), insulin-dependent diabetes mellitus, chronic hypoxic respiratory failure on 4 L of nasal cannula oxygen, pneumonia due to COVID-19/2020, chronic kidney disease stage III presents with complaints of being unable woken up out of her sleep unable to breathe. Reported associated symptoms of chest tightness, epigastric abdominal discomfort, abdominal distention, and nausea. She has been compliant with all of her home medications. Denies any fever, chills, cough, weight gain, leg swelling, vomiting, dysuria, diarrhea, or constipation. She reports that is hard to take deep inspiratory breath. Her last bowel movement was yesterday.  EMS noted that the patient was satting 76% on home oxygen of 4 L and improved after being placed on a nonrebreather.  Interim history Admitted with acute hypoxic respiratory failure secondary to CHF exacerbation.  Cardiology consulted, currently on IV Lasix.  Assessment & Plan   Acute respiratory failure with hypoxia secondary to Acute combined systolic and diastolic CHF exacerbation -Patient continues to require 7 L of oxygen (usually on 4 L at home)- was able to wean O2 to 4L -Patient was noted to be hypoxic on admission -BNP 863.1; chest x-ray revealed pulmonary edema with bilateral effusions -Of note patient was recently hospitalized for CHF exacerbation back in June 2021 -Echocardiogram 09/27/2019 showed an EF of 89-38%, grade 3 diastolic dysfunction -Patient recently had undergone right heart catheterization showing elevated biventricular filling pressures -Currently on IV Lasix 80 mg twice daily -Coreg held by cardiology -Continue to monitor intake and output, daily  weights -Discussed with Kara Hanson, cardiology, improving today, but continue IV lasix today, possible transition to oral on 8/23   Abdominal pain with diarrhea -Continues to complain of abdominal pain and states had loose stool yesterday -CT abdomen pelvis showed focal acute uncomplicated diverticulitis of the proximal descending colon, moderate bilateral pleural effusions with dense areas of consolidation at lung bases -C diff unremarkable  -Was placed on IV ceftriaxone and flagyl -Abdominal x-ray obtained today showing no obstruction or free air.  Moderate stool. -continue imodium PRN  Diabetes mellitus type 2, uncontrolled with hyperglycemia and peripheral neuropathy -Presented with initial glucose of 180  -Last hemoglobin A1c 7.1 on 09/26/2019  -Home medications including glargine 5 units daily and sliding scale with meals. -Continue gabapentin for neuropathy -Continue Lantus, insulin sliding scale and CBG monitoring  Essential hypertension -Continue hydralazine, Lasix -Coreg held by cardiology  Chronic kidney disease stage IV -Creatinine currently 1.80 and appears to be stable -Continue to monitor closely given diuresis   Anemia of chronic disease/normocytic anemia  -Hemoglobin dropped to 8.1 today (baseline 8-9) -anemia panel obtained showing iron of 47, ferritin 246, saturation ratio 24 -FOBT negative -Continue to monitor CBC, if hemoglobin drops below 7, will transfuse  Hyperlipidemia  -Continue statin  Deconditioning -PT and OT recommended home health  DVT Prophylaxis Heparin  Code Status: Full  Family Communication: None at bedside  Disposition Plan:  Status is: Inpatient  Remains inpatient appropriate because:Inpatient level of care appropriate due to severity of illness. Pending improvement in respiratory status and cardiology recommendations. Continues to be on IV lasix.    Dispo: The patient is from: Home              Anticipated d/c is to: Home  with home health  Anticipated d/c date is: 2 days              Patient currently is not medically stable to d/c.  Consultants Cardiology   Procedures  None  Antibiotics   Anti-infectives (From admission, onward)   Start     Dose/Rate Route Frequency Ordered Stop   11/19/19 1830  cefTRIAXone (ROCEPHIN) 2 g in sodium chloride 0.9 % 100 mL IVPB        2 g 200 mL/hr over 30 Minutes Intravenous Every 24 hours 11/19/19 1807     11/19/19 1830  metroNIDAZOLE (FLAGYL) IVPB 500 mg        500 mg 100 mL/hr over 60 Minutes Intravenous Every 8 hours 11/19/19 1807        Subjective:   Kara Hanson seen and examined today.  Feels shortness of breath is improved however has not been out of bed this morning. Denies chest pain, dizziness, headache. Denies abdominal pain, further diarrhea.  Objective:   Vitals:   11/23/19 2044 11/23/19 2342 11/24/19 0343 11/24/19 0804  BP: 137/61 124/64 132/64 130/63  Pulse: 91 82 78 81  Resp: 17 18 18 18   Temp: 98 F (36.7 C) 98.1 F (36.7 C) 97.9 F (36.6 C) 97.8 F (36.6 C)  TempSrc: Oral Oral Oral Oral  SpO2: 98% 100% 97% 99%  Weight:   64.2 kg   Height:        Intake/Output Summary (Last 24 hours) at 11/24/2019 1138 Last data filed at 11/24/2019 0000 Gross per 24 hour  Intake 873 ml  Output --  Net 873 ml   Filed Weights   11/22/19 0351 11/23/19 0429 11/24/19 0343  Weight: 62.9 kg 64.4 kg 64.2 kg   Exam  General: Well developed, well nourished, NAD, appears stated age  HEENT: NCAT, mucous membranes moist.   Cardiovascular: S1 S2 auscultated, RRR, no murmur  Respiratory: diminished breath sounds  Abdomen: Soft, nontender, nondistended, + bowel sounds  Extremities: warm dry without cyanosis clubbing or edema  Neuro: AAOx3, nonfocal  Psych: Appropriate mood and affect  Data Reviewed: I have personally reviewed following labs and imaging studies  CBC: Recent Labs  Lab 11/19/19 0510 11/19/19 0958 11/21/19 0439  11/21/19 1929 11/22/19 0430 11/23/19 0201 11/24/19 0506  WBC 7.7  --  5.3  --  4.6  --   --   HGB 8.4*   < > 7.5* 8.4* 7.8* 8.1* 8.1*  HCT 27.4*   < > 25.2* 28.2* 25.7* 26.7* 26.9*  MCV 93.8  --  93.7  --  92.8  --   --   PLT 306  --  287  --  284  --   --    < > = values in this interval not displayed.   Basic Metabolic Panel: Recent Labs  Lab 11/20/19 0644 11/21/19 0439 11/22/19 0430 11/23/19 0201 11/24/19 0506  NA 139 138 137 139 138  K 3.9 3.8 3.5 3.5 3.5  CL 105 103 103 105 105  CO2 24 25 24 24 22   GLUCOSE 138* 96 123* 85 140*  BUN 63* 58* 53* 51* 46*  CREATININE 1.92* 1.96* 1.80* 1.93* 1.80*  CALCIUM 9.4 9.3 9.2 9.4 8.9   GFR: Estimated Creatinine Clearance: 26.4 mL/min (A) (by C-G formula based on SCr of 1.8 mg/dL (H)). Liver Function Tests: Recent Labs  Lab 11/19/19 2217  AST 12*  ALT 12  ALKPHOS 80  BILITOT 0.4  PROT 6.1*  ALBUMIN 2.7*   Recent Labs  Lab  11/19/19 2217  LIPASE 23   No results for input(s): AMMONIA in the last 168 hours. Coagulation Profile: No results for input(s): INR, PROTIME in the last 168 hours. Cardiac Enzymes: No results for input(s): CKTOTAL, CKMB, CKMBINDEX, TROPONINI in the last 168 hours. BNP (last 3 results) No results for input(s): PROBNP in the last 8760 hours. HbA1C: No results for input(s): HGBA1C in the last 72 hours. CBG: Recent Labs  Lab 11/23/19 0727 11/23/19 1133 11/23/19 1614 11/23/19 2049 11/24/19 0802  GLUCAP 81 199* 127* 156* 121*   Lipid Profile: No results for input(s): CHOL, HDL, LDLCALC, TRIG, CHOLHDL, LDLDIRECT in the last 72 hours. Thyroid Function Tests: No results for input(s): TSH, T4TOTAL, FREET4, T3FREE, THYROIDAB in the last 72 hours. Anemia Panel: No results for input(s): VITAMINB12, FOLATE, FERRITIN, TIBC, IRON, RETICCTPCT in the last 72 hours. Urine analysis:    Component Value Date/Time   COLORURINE AMBER (A) 09/26/2019 1645   APPEARANCEUR CLOUDY (A) 09/26/2019 1645   LABSPEC  1.014 09/26/2019 1645   PHURINE 5.0 09/26/2019 1645   GLUCOSEU 50 (A) 09/26/2019 1645   HGBUR SMALL (A) 09/26/2019 1645   BILIRUBINUR NEGATIVE 09/26/2019 1645   BILIRUBINUR negative 12/13/2017 1514   KETONESUR NEGATIVE 09/26/2019 1645   PROTEINUR >=300 (A) 09/26/2019 1645   UROBILINOGEN 0.2 12/13/2017 1514   NITRITE NEGATIVE 09/26/2019 1645   LEUKOCYTESUR SMALL (A) 09/26/2019 1645   Sepsis Labs: @LABRCNTIP (procalcitonin:4,lacticidven:4)  ) Recent Results (from the past 240 hour(s))  SARS Coronavirus 2 by RT PCR (hospital order, performed in [place] hospital lab) Nasopharyngeal Nasopharyngeal Swab     Status: None   Collection Time: 11/19/19  4:39 AM   Specimen: Nasopharyngeal Swab  Result Value Ref Range Status   SARS Coronavirus 2 NEGATIVE NEGATIVE Final    Comment: (NOTE) SARS-CoV-2 target nucleic acids are NOT DETECTED.  The SARS-CoV-2 RNA is generally detectable in upper and lower respiratory specimens during the acute phase of infection. The lowest concentration of SARS-CoV-2 viral copies this assay can detect is 250 copies / mL. A negative result does not preclude SARS-CoV-2 infection and should not be used as the sole basis for treatment or other patient management decisions.  A negative result may occur with improper specimen collection / handling, submission of specimen other than nasopharyngeal swab, presence of viral mutation(s) within the areas targeted by this assay, and inadequate number of viral copies (<250 copies / mL). A negative result must be combined with clinical observations, patient history, and epidemiological information.  Fact Sheet for Patients:   StrictlyIdeas.no  Fact Sheet for Healthcare Providers: BankingDealers.co.za  This test is not yet approved or  cleared by the [place] FDA and has been authorized for detection and/or diagnosis of SARS-CoV-2 by FDA under an Emergency Use  Authorization (EUA).  This EUA will remain in effect (meaning this test can be used) for the duration of the COVID-19 declaration under Section 564(b)(1) of the Act, 21 U.S.C. section 360bbb-3(b)(1), unless the authorization is terminated or revoked sooner.  Performed at [facility], [place] [address] [place], [place] [address]   C Difficile Quick Screen w PCR reflex     Status: Abnormal   Collection Time: 11/21/19  1:36 PM   Specimen: STOOL  Result Value Ref Range Status   C Diff antigen POSITIVE (A) NEGATIVE Final   C Diff toxin NEGATIVE NEGATIVE Final   C Diff interpretation Results are indeterminate. See PCR results.  Final    Comment: Performed at [facility]  Hospital Lab, [place] [address], [place], [place] [address]  C. Diff by PCR, Reflexed     Status: None   Collection Time: 11/21/19  1:36 PM  Result Value Ref Range Status   Toxigenic C. Difficile by PCR NEGATIVE NEGATIVE Final    Comment: Patient is colonized with non toxigenic C. difficile. May not need treatment unless significant symptoms are present. Performed at [facility], [place] [address], [place], [place] [address]       Radiology Studies: No results found.   Scheduled Meds: . aspirin EC  81 mg Oral q AM  . atorvastatin  80 mg Oral QHS  . DULoxetine  30 mg Oral QHS  . fluticasone  2 spray Each Nare Daily  . gabapentin  100 mg Oral BID  . heparin  5,000 Units Subcutaneous Q8H  . hydrALAZINE  12.5 mg Oral TID  . insulin aspart  0-9 Units Subcutaneous TID WC  . insulin glargine  5 Units Subcutaneous Daily  . pantoprazole  20 mg Oral Daily  . primidone  50 mg Oral BID  . sodium chloride flush  3 mL Intravenous Q12H   Continuous Infusions: . sodium chloride    . cefTRIAXone (ROCEPHIN)  IV 2 g (11/23/19 2115)  . furosemide    . metronidazole 500 mg (11/24/19 0913)     LOS: 5 days   Time Spent in minutes   30 minutes  Sahra Converse D.O. on 11/24/2019 at 11:38 AM  Between 7am to 7pm - Please see  pager noted on [email or web address]  After 7pm go to [email or web address]  And look for the night coverage person covering for me after hours  [facility] Office  [phone]

## 2019-11-24 NOTE — Progress Notes (Signed)
Progress Note  Patient Name: Kara Hanson Date of Encounter: 11/24/2019  [facility] Cardiologist: Candee Furbish, MD / Dr. Haroldine Laws  Subjective   I/Os not recorded.  SpO2 99% on 4L.  Renal function stable (creatinine 1.9 > 2.0 > 1.8 > 1.9 >1.8).  Weight recorded as elevated from admission (141 >140 >139 > 142 lbs >142 lbs).  Reports dyspnea improving.  Inpatient Medications    Scheduled Meds: . aspirin EC  81 mg Oral q AM  . atorvastatin  80 mg Oral QHS  . DULoxetine  30 mg Oral QHS  . fluticasone  2 spray Each Nare Daily  . furosemide  80 mg Intravenous BID  . gabapentin  100 mg Oral BID  . heparin  5,000 Units Subcutaneous Q8H  . hydrALAZINE  12.5 mg Oral TID  . insulin aspart  0-9 Units Subcutaneous TID WC  . insulin glargine  5 Units Subcutaneous Daily  . pantoprazole  20 mg Oral Daily  . primidone  50 mg Oral BID  . sodium chloride flush  3 mL Intravenous Q12H   Continuous Infusions: . sodium chloride    . cefTRIAXone (ROCEPHIN)  IV 2 g (11/23/19 2115)  . metronidazole 500 mg (11/24/19 0913)   PRN Meds: sodium chloride, acetaminophen, guaiFENesin-dextromethorphan, loperamide, ondansetron (ZOFRAN) IV, sodium chloride flush   Vital Signs    Vitals:   11/23/19 2044 11/23/19 2342 11/24/19 0343 11/24/19 0804  BP: 137/61 124/64 132/64 130/63  Pulse: 91 82 78 81  Resp: 17 18 18 18   Temp: 98 F (36.7 C) 98.1 F (36.7 C) 97.9 F (36.6 C) 97.8 F (36.6 C)  TempSrc: Oral Oral Oral Oral  SpO2: 98% 100% 97% 99%  Weight:   64.2 kg   Height:        Intake/Output Summary (Last 24 hours) at 11/24/2019 1058 Last data filed at 11/24/2019 0000 Gross per 24 hour  Intake 873 ml  Output --  Net 873 ml   Last 3 Weights 11/24/2019 11/23/2019 11/22/2019  Weight (lbs) 141 lb 8.2 oz 142 lb 138 lb 10.4 oz  Weight (kg) 64.19 kg 64.411 kg 62.892 kg      Telemetry    Sinus rhythm, PVCs- Personally Reviewed  ECG    No new- Personally Reviewed  Physical Exam   GEN:  Well nourished, well developed, in no acute distress  HEENT: normal  Neck: + JVD, carotid bruits, or masses Cardiac: RRR; no murmurs, rubs, or gallops,no edema  Respiratory:  Basilar crackles GI: soft, nontender, nondistended, + BS MS: no deformity or atrophy  Skin: warm and dry, no rash Neuro:  Alert and Oriented x 3, Strength and sensation are intact Psych: euthymic mood, full affect   Labs    High Sensitivity Troponin:   Recent Labs  Lab 11/19/19 0510  TROPONINIHS 21*      Chemistry Recent Labs  Lab 11/19/19 2217 11/20/19 0644 11/22/19 0430 11/23/19 0201 11/24/19 0506  NA  --    < > 137 139 138  K  --    < > 3.5 3.5 3.5  CL  --    < > 103 105 105  CO2  --    < > 24 24 22   GLUCOSE  --    < > 123* 85 140*  BUN  --    < > 53* 51* 46*  CREATININE  --    < > 1.80* 1.93* 1.80*  CALCIUM  --    < > 9.2 9.4 8.9  PROT 6.1*  --   --   --   --   ALBUMIN 2.7*  --   --   --   --   AST 12*  --   --   --   --   ALT 12  --   --   --   --   ALKPHOS 80  --   --   --   --   BILITOT 0.4  --   --   --   --   GFRNONAA  --    < > 29* 27* 29*  GFRAA  --    < > 34* 31* 34*  ANIONGAP  --    < > 10 10 11    < > = values in this interval not displayed.     Hematology Recent Labs  Lab 11/19/19 0510 11/19/19 1950 11/21/19 0439 11/21/19 0852 11/21/19 1929 11/22/19 0430 11/23/19 0201 11/24/19 0506  WBC 7.7  --  5.3  --   --  4.6  --   --   RBC 2.92*  --  2.69* 2.97*  --  2.77*  --   --   HGB 8.4*   < > 7.5*  --    < > 7.8* 8.1* 8.1*  HCT 27.4*   < > 25.2*  --    < > 25.7* 26.7* 26.9*  MCV 93.8  --  93.7  --   --  92.8  --   --   MCH 28.8  --  27.9  --   --  28.2  --   --   MCHC 30.7  --  29.8*  --   --  30.4  --   --   RDW 16.9*  --  17.1*  --   --  17.0*  --   --   PLT 306  --  287  --   --  284  --   --    < > = values in this interval not displayed.    BNP Recent Labs  Lab 11/19/19 0510 11/23/19 1306  BNP 863.1* 867.6*     DDimer No results for input(s): DDIMER in  the last 168 hours.   Radiology    No results found.  Cardiac Studies   Right Heart Catheterization 09/30/2019: Findings: RA = 13 RV = 63/16 PA = 62/23 (40) PCW = 27 (v=43) Fick cardiac output/index = 4.8/2.9 Thermo CO/CI = 2.3/1.4 PVR = 5.7 WU (Thermo) Ao sat = 95% PA sat = 54%, 56%  Assessment: 1. Markedly elevated biventricular filling pressures  Echocardiogram 09/27/2019: Impressions: 1. Left ventricular ejection fraction, by estimation, is 20 to 25%. The  left ventricle has severely decreased function. The left ventricle  demonstrates global hypokinesis. The left ventricular internal cavity size  was mildly dilated. There is mild  asymmetric left ventricular hypertrophy of the inferolateral segment. Left  ventricular diastolic parameters are consistent with Grade III diastolic  dysfunction (restrictive). Elevated left ventricular end-diastolic  pressure.  2. Right ventricular systolic function is normal. The right ventricular  size is normal. There is mildly elevated pulmonary artery systolic  pressure. The estimated right ventricular systolic pressure is 93.2 mmHg.  3. Left atrial size was moderately dilated.  4. The mitral valve is grossly normal. Mild mitral valve regurgitation  due to LV dysfunction with leaflet tenting.  5. Tricuspid valve regurgitation is moderate.  6. The aortic valve is abnormal. AV gradient may be underestimated due to  LV dysfunction.  7. The inferior vena cava is normal in size with <50% respiratory  variability, suggesting right atrial pressure of 8 mmHg.   Patient Profile     64 y.o. female with a history of mild CAD on cath in 02/2019, chronic combined CHF with LVEF of 20-25% on Echo in 09/2019, chronic respiratory failure on 4L of O2 at home, prior CVA, hypertension, hyperlipidemia, type 2 diabetes on insulin, CKD stage IV, chronic anemia, and COVID pneumonia in 07/2018 who is being seen for CHF   Assessment & Plan    Acute  on chronic combined systolic/diastolic heart failure, nonischemic cardiomyopathy, biventricular failure-CKD 4 -She has had complete work-ups previously right and left heart cath with advanced heart failure team.  Previous hospitalization was on milrinone IV which was eventually weaned.  Discussions with palliative care team ensued. -Continue with hydralazine 12.5 3 times daily.  She has not been able to tolerate other medications. -Started coreg this admission, would favor holding for now in setting of decompensated heart failure -Creat is stable.  Lactate normal.  Oxygen requirement has normalized.  Weight has not decreased but notably is at the weight she was discharged at last CHF exacerbation.  Still with some crackles on lung exam and elevated JVD, will continue IV lasix today and possibly switch to PO torsemide tomorrow  Epigastric discomfort  --No CAD on Cath prior --GI related  Abdominal pain--main complaint -CT scan, potential inflammatory changes suggestive of proximal descending diverticulitis noted.  She is being treated with antibiotics. Neg CDIFF     For questions or updates, please contact [place] Please consult [email or web address] for contact info under        Signed, Donato Heinz, MD  11/24/2019, 10:58 AM

## 2019-11-25 LAB — GLUCOSE, CAPILLARY
Glucose-Capillary: 116 mg/dL — ABNORMAL HIGH (ref 70–99)
Glucose-Capillary: 133 mg/dL — ABNORMAL HIGH (ref 70–99)
Glucose-Capillary: 100 mg/dL — ABNORMAL HIGH (ref 70–99)
Glucose-Capillary: 175 mg/dL — ABNORMAL HIGH (ref 70–99)

## 2019-11-25 LAB — BASIC METABOLIC PANEL
Anion gap: 13 (ref 5–15)
BUN: 42 mg/dL — ABNORMAL HIGH (ref 8–23)
CO2: 22 mmol/L (ref 22–32)
Calcium: 9.1 mg/dL (ref 8.9–10.3)
Chloride: 103 mmol/L (ref 98–111)
Creatinine, Ser: 1.72 mg/dL — ABNORMAL HIGH (ref 0.44–1.00)
GFR calc Af Amer: 36 mL/min — ABNORMAL LOW (ref >60)
GFR calc non Af Amer: 31 mL/min — ABNORMAL LOW (ref >60)
Glucose, Bld: 81 mg/dL (ref 70–99)
Potassium: 3.5 mmol/L (ref 3.5–5.1)
Sodium: 138 mmol/L (ref 135–145)

## 2019-11-25 MED ORDER — TORSEMIDE 20 MG PO TABS
40.0000 mg | ORAL_TABLET | Freq: Two times a day (BID) | ORAL | Status: DC
  Administered 2019-11-25 – 2019-11-26 (×3): 40 mg via ORAL
  Filled 2019-11-25 (×3): qty 2

## 2019-11-25 NOTE — Progress Notes (Signed)
PROGRESS NOTE    Kara Hanson  ERX:[id number] DOB: [birth year] DOA: 11/19/2019 PCP: [name], MD   Brief Narrative:  HPI On 11/19/2019 by Dr. Fuller Plan [name] is a 64 y.o. female with medical history significant of hypertension, systolic congestive heart failure (last EF 20-25%), insulin-dependent diabetes mellitus, chronic hypoxic respiratory failure on 4 L of nasal cannula oxygen, pneumonia due to COVID-19/2020, chronic kidney disease stage III presents with complaints of being unable woken up out of her sleep unable to breathe. Reported associated symptoms of chest tightness, epigastric abdominal discomfort, abdominal distention, and nausea. She has been compliant with all of her home medications. Denies any fever, chills, cough, weight gain, leg swelling, vomiting, dysuria, diarrhea, or constipation. She reports that is hard to take deep inspiratory breath. Her last bowel movement was yesterday.  EMS noted that the patient was satting 76% on home oxygen of 4 L and improved after being placed on a nonrebreather.  Interim history Admitted with acute hypoxic respiratory failure secondary to CHF exacerbation.  Cardiology consulted, currently on IV Lasix.  Assessment & Plan   Acute on chronic respiratory failure with hypoxia secondary to Acute combined systolic and diastolic CHF exacerbation -Patient continues to require 7 L of oxygen (usually on 4 L at home)- was able to wean O2 to 4L -Patient was noted to be hypoxic on admission -BNP 863.1; chest x-ray revealed pulmonary edema with bilateral effusions -Of note patient was recently hospitalized for CHF exacerbation back in June 2021 -Echocardiogram 09/27/2019 showed an EF of 95-09%, grade 3 diastolic dysfunction -Patient recently had undergone right heart catheterization showing elevated biventricular filling pressures -Was placed on on IV Lasix 80 mg twice daily.  Will be transitioned to torsemide 40 mg twice  daily -Coreg held by cardiology -Continue to monitor intake and output, daily weights -Discussed with Dr. Gardiner Rhyme, cardiology, improving today, but continue IV lasix today, possible transition to oral on 8/23 -O2 saturation on room air at rest and with ambulation 78%, on 4L with ambulation was 95%  Abdominal pain with diarrhea -Continues to complain of abdominal pain and states had loose stool yesterday -CT abdomen pelvis showed focal acute uncomplicated diverticulitis of the proximal descending colon, moderate bilateral pleural effusions with dense areas of consolidation at lung bases -C diff unremarkable  -Was placed on IV ceftriaxone and flagyl -Abdominal x-ray obtained today showing no obstruction or free air.  Moderate stool. -continue imodium PRN  Diabetes mellitus type 2, uncontrolled with hyperglycemia and peripheral neuropathy -Presented with initial glucose of 180  -Last hemoglobin A1c 7.1 on 09/26/2019  -Home medications including glargine 5 units daily and sliding scale with meals. -Continue gabapentin for neuropathy -Continue Lantus, insulin sliding scale and CBG monitoring  Essential hypertension -Continue hydralazine, Lasix -Coreg held by cardiology  Chronic kidney disease stage IV -Creatinine currently 1.72 and appears to be stable -Continue to monitor closely given diuresis   Anemia of chronic disease/normocytic anemia  -Hemoglobin dropped to 8.1 today (baseline 8-9) -anemia panel obtained showing iron of 47, ferritin 246, saturation ratio 24 -FOBT negative -Continue to monitor CBC, if hemoglobin drops below 7, will transfuse  Hyperlipidemia  -Continue statin  Deconditioning -PT and OT recommended home health  DVT Prophylaxis Heparin  Code Status: Full  Family Communication: None at bedside  Disposition Plan:  Status is: Inpatient  Remains inpatient appropriate because:Inpatient level of care appropriate due to severity of illness. Pending  improvement in respiratory status and cardiology recommendations. Continues to be on IV lasix.  Dispo: The patient is from: Home              Anticipated d/c is to: Home with home health              Anticipated d/c date is: 2 days              Patient currently is not medically stable to d/c.  Consultants Cardiology   Procedures  None  Antibiotics   Anti-infectives (From admission, onward)   Start     Dose/Rate Route Frequency Ordered Stop   11/19/19 1830  cefTRIAXone (ROCEPHIN) 2 g in sodium chloride 0.9 % 100 mL IVPB        2 g 200 mL/hr over 30 Minutes Intravenous Every 24 hours 11/19/19 1807     11/19/19 1830  metroNIDAZOLE (FLAGYL) IVPB 500 mg        500 mg 100 mL/hr over 60 Minutes Intravenous Every 8 hours 11/19/19 1807        Subjective:   Kara Hanson seen and examined today.  Breathing is improving.  She denies current chest pain or shortness of breath.  Feels breathing is back to baseline.  Denies further abdominal pain, diarrhea.    Objective:   Vitals:   11/24/19 2246 11/24/19 2354 11/25/19 0448 11/25/19 0756  BP: 121/65 111/65 128/67 135/65  Pulse:  74 82 88  Resp:  18 16 18   Temp:  97.9 F (36.6 C) 97.8 F (36.6 C) 98 F (36.7 C)  TempSrc:  Oral Oral Oral  SpO2:  98% 95% 95%  Weight:   64.4 kg   Height:        Intake/Output Summary (Last 24 hours) at 11/25/2019 1418 Last data filed at 11/24/2019 2359 Gross per 24 hour  Intake 1116 ml  Output --  Net 1116 ml   Filed Weights   11/23/19 0429 11/24/19 0343 11/25/19 0448  Weight: 64.4 kg 64.2 kg 64.4 kg   Exam  General: Well developed, well nourished, NAD, appears stated age  10: NCAT, mucous membranes moist.   Cardiovascular: S1 S2 auscultated, RRR, no murmur  Respiratory: diminished breath sounds, no crackles or wheezing  Abdomen: Soft, nontender, nondistended, + bowel sounds  Extremities: warm dry without cyanosis clubbing or edema  Neuro: AAOx3, nonfocal  Psych: Appropriate  mood and affect, pleasant  Data Reviewed: I have personally reviewed following labs and imaging studies  CBC: Recent Labs  Lab 11/19/19 0510 11/19/19 0958 11/21/19 0439 11/21/19 1929 11/22/19 0430 11/23/19 0201 11/24/19 0506  WBC 7.7  --  5.3  --  4.6  --   --   HGB 8.4*   < > 7.5* 8.4* 7.8* 8.1* 8.1*  HCT 27.4*   < > 25.2* 28.2* 25.7* 26.7* 26.9*  MCV 93.8  --  93.7  --  92.8  --   --   PLT 306  --  287  --  284  --   --    < > = values in this interval not displayed.   Basic Metabolic Panel: Recent Labs  Lab 11/21/19 0439 11/22/19 0430 11/23/19 0201 11/24/19 0506 11/25/19 0609  NA 138 137 139 138 138  K 3.8 3.5 3.5 3.5 3.5  CL 103 103 105 105 103  CO2 25 24 24 22 22   GLUCOSE 96 123* 85 140* 81  BUN 58* 53* 51* 46* 42*  CREATININE 1.96* 1.80* 1.93* 1.80* 1.72*  CALCIUM 9.3 9.2 9.4 8.9 9.1   GFR: Estimated Creatinine Clearance: 27.7  mL/min (A) (by C-G formula based on SCr of 1.72 mg/dL (H)). Liver Function Tests: Recent Labs  Lab 11/19/19 2217  AST 12*  ALT 12  ALKPHOS 80  BILITOT 0.4  PROT 6.1*  ALBUMIN 2.7*   Recent Labs  Lab 11/19/19 2217  LIPASE 23   No results for input(s): AMMONIA in the last 168 hours. Coagulation Profile: No results for input(s): INR, PROTIME in the last 168 hours. Cardiac Enzymes: No results for input(s): CKTOTAL, CKMB, CKMBINDEX, TROPONINI in the last 168 hours. BNP (last 3 results) No results for input(s): PROBNP in the last 8760 hours. HbA1C: No results for input(s): HGBA1C in the last 72 hours. CBG: Recent Labs  Lab 11/24/19 1205 11/24/19 1622 11/24/19 2123 11/25/19 0744 11/25/19 1206  GLUCAP 144* 157* 145* 116* 133*   Lipid Profile: No results for input(s): CHOL, HDL, LDLCALC, TRIG, CHOLHDL, LDLDIRECT in the last 72 hours. Thyroid Function Tests: No results for input(s): TSH, T4TOTAL, FREET4, T3FREE, THYROIDAB in the last 72 hours. Anemia Panel: No results for input(s): VITAMINB12, FOLATE, FERRITIN, TIBC,  IRON, RETICCTPCT in the last 72 hours. Urine analysis:    Component Value Date/Time   COLORURINE AMBER (A) 09/26/2019 1645   APPEARANCEUR CLOUDY (A) 09/26/2019 1645   LABSPEC 1.014 09/26/2019 1645   PHURINE 5.0 09/26/2019 1645   GLUCOSEU 50 (A) 09/26/2019 1645   HGBUR SMALL (A) 09/26/2019 1645   BILIRUBINUR NEGATIVE 09/26/2019 1645   BILIRUBINUR negative 12/13/2017 1514   KETONESUR NEGATIVE 09/26/2019 1645   PROTEINUR >=300 (A) 09/26/2019 1645   UROBILINOGEN 0.2 12/13/2017 1514   NITRITE NEGATIVE 09/26/2019 1645   LEUKOCYTESUR SMALL (A) 09/26/2019 1645   Sepsis Labs: @LABRCNTIP (procalcitonin:4,lacticidven:4)  ) Recent Results (from the past 240 hour(s))  SARS Coronavirus 2 by RT PCR (hospital order, performed in [place] hospital lab) Nasopharyngeal Nasopharyngeal Swab     Status: None   Collection Time: 11/19/19  4:39 AM   Specimen: Nasopharyngeal Swab  Result Value Ref Range Status   SARS Coronavirus 2 NEGATIVE NEGATIVE Final    Comment: (NOTE) SARS-CoV-2 target nucleic acids are NOT DETECTED.  The SARS-CoV-2 RNA is generally detectable in upper and lower respiratory specimens during the acute phase of infection. The lowest concentration of SARS-CoV-2 viral copies this assay can detect is 250 copies / mL. A negative result does not preclude SARS-CoV-2 infection and should not be used as the sole basis for treatment or other patient management decisions.  A negative result may occur with improper specimen collection / handling, submission of specimen other than nasopharyngeal swab, presence of viral mutation(s) within the areas targeted by this assay, and inadequate number of viral copies (<250 copies / mL). A negative result must be combined with clinical observations, patient history, and epidemiological information.  Fact Sheet for Patients:   StrictlyIdeas.no  Fact Sheet for Healthcare  Providers: BankingDealers.co.za  This test is not yet approved or  cleared by the [place] FDA and has been authorized for detection and/or diagnosis of SARS-CoV-2 by FDA under an Emergency Use Authorization (EUA).  This EUA will remain in effect (meaning this test can be used) for the duration of the COVID-19 declaration under Section 564(b)(1) of the Act, 21 U.S.C. section 360bbb-3(b)(1), unless the authorization is terminated or revoked sooner.  Performed at [facility], [place] [address], [place], [place] [address]   C Difficile Quick Screen w PCR reflex     Status: Abnormal   Collection Time: 11/21/19  1:36 PM   Specimen:  STOOL  Result Value Ref Range Status   C Diff antigen POSITIVE (A) NEGATIVE Final   C Diff toxin NEGATIVE NEGATIVE Final   C Diff interpretation Results are indeterminate. See PCR results.  Final    Comment: Performed at [facility], [place] [address], [place], [place] [address]  C. Diff by PCR, Reflexed     Status: None   Collection Time: 11/21/19  1:36 PM  Result Value Ref Range Status   Toxigenic C. Difficile by PCR NEGATIVE NEGATIVE Final    Comment: Patient is colonized with non toxigenic C. difficile. May not need treatment unless significant symptoms are present. Performed at [facility], [place] [address], [place], [place] [address]       Radiology Studies: No results found.   Scheduled Meds: . aspirin EC  81 mg Oral q AM  . atorvastatin  80 mg Oral QHS  . DULoxetine  30 mg Oral QHS  . fluticasone  2 spray Each Nare Daily  . gabapentin  100 mg Oral BID  . heparin  5,000 Units Subcutaneous Q8H  . hydrALAZINE  12.5 mg Oral TID  . insulin aspart  0-9 Units Subcutaneous TID WC  . insulin glargine  5 Units Subcutaneous Daily  . pantoprazole  20 mg Oral Daily  . primidone  50 mg Oral BID  . sodium chloride flush  3 mL Intravenous Q12H  . torsemide  40 mg Oral BID   Continuous Infusions: . sodium  chloride    . cefTRIAXone (ROCEPHIN)  IV 2 g (11/24/19 2251)  . metronidazole 500 mg (11/25/19 0854)     LOS: 6 days   Time Spent in minutes   30 minutes  Quentyn Kolbeck D.O. on 11/25/2019 at 2:18 PM  Between 7am to 7pm - Please see pager noted on [email or web address]  After 7pm go to [email or web address]  And look for the night coverage person covering for me after hours  [facility] Office  [phone]

## 2019-11-25 NOTE — Progress Notes (Signed)
Progress Note  Patient Name: Kara Hanson Date of Encounter: 11/25/2019  [facility] Cardiologist: Candee Furbish, MD / Dr. Haroldine Laws  Subjective   I/Os not recorded.  SpO2 95% on 3L.  Renal function stable (creatinine 1.9 > 2.0 > 1.8 > 1.9 >1.8>1.7).  Weight unchanged (142 lbs). Reports dyspnea improving, states that she feels back to her baseline.  Inpatient Medications    Scheduled Meds: . aspirin EC  81 mg Oral q AM  . atorvastatin  80 mg Oral QHS  . DULoxetine  30 mg Oral QHS  . fluticasone  2 spray Each Nare Daily  . gabapentin  100 mg Oral BID  . heparin  5,000 Units Subcutaneous Q8H  . hydrALAZINE  12.5 mg Oral TID  . insulin aspart  0-9 Units Subcutaneous TID WC  . insulin glargine  5 Units Subcutaneous Daily  . pantoprazole  20 mg Oral Daily  . primidone  50 mg Oral BID  . sodium chloride flush  3 mL Intravenous Q12H   Continuous Infusions: . sodium chloride    . cefTRIAXone (ROCEPHIN)  IV 2 g (11/24/19 2251)  . metronidazole 500 mg (11/25/19 0854)   PRN Meds: sodium chloride, acetaminophen, guaiFENesin-dextromethorphan, loperamide, ondansetron (ZOFRAN) IV, sodium chloride flush   Vital Signs    Vitals:   11/24/19 2246 11/24/19 2354 11/25/19 0448 11/25/19 0756  BP: 121/65 111/65 128/67 135/65  Pulse:  74 82 88  Resp:  18 16 18   Temp:  97.9 F (36.6 C) 97.8 F (36.6 C) 98 F (36.7 C)  TempSrc:  Oral Oral Oral  SpO2:  98% 95% 95%  Weight:   64.4 kg   Height:        Intake/Output Summary (Last 24 hours) at 11/25/2019 1000 Last data filed at 11/24/2019 2359 Gross per 24 hour  Intake 1116 ml  Output --  Net 1116 ml   Last 3 Weights 11/25/2019 11/24/2019 11/23/2019  Weight (lbs) 141 lb 14.9 oz 141 lb 8.2 oz 142 lb  Weight (kg) 64.38 kg 64.19 kg 64.411 kg      Telemetry    Sinus rhythm, PVCs- Personally Reviewed  ECG    No new- Personally Reviewed  Physical Exam   GEN: Well nourished, well developed, in no acute distress  HEENT: normal   Neck: mild JVD, carotid bruits, or masses Cardiac: RRR; no murmurs, rubs, or gallops,no edema  Respiratory:  CTAB GI: soft, nontender, nondistended, + BS MS: no deformity or atrophy  Skin: warm and dry, no rash Neuro:  Alert and Oriented x 3, Strength and sensation are intact Psych: euthymic mood, full affect   Labs    High Sensitivity Troponin:   Recent Labs  Lab 11/19/19 0510  TROPONINIHS 21*      Chemistry Recent Labs  Lab 11/19/19 2217 11/20/19 0644 11/23/19 0201 11/24/19 0506 11/25/19 0609  NA  --    < > 139 138 138  K  --    < > 3.5 3.5 3.5  CL  --    < > 105 105 103  CO2  --    < > 24 22 22   GLUCOSE  --    < > 85 140* 81  BUN  --    < > 51* 46* 42*  CREATININE  --    < > 1.93* 1.80* 1.72*  CALCIUM  --    < > 9.4 8.9 9.1  PROT 6.1*  --   --   --   --  ALBUMIN 2.7*  --   --   --   --   AST 12*  --   --   --   --   ALT 12  --   --   --   --   ALKPHOS 80  --   --   --   --   BILITOT 0.4  --   --   --   --   GFRNONAA  --    < > 27* 29* 31*  GFRAA  --    < > 31* 34* 36*  ANIONGAP  --    < > 10 11 13    < > = values in this interval not displayed.     Hematology Recent Labs  Lab 11/19/19 0510 11/19/19 2202 11/21/19 0439 11/21/19 0852 11/21/19 1929 11/22/19 0430 11/23/19 0201 11/24/19 0506  WBC 7.7  --  5.3  --   --  4.6  --   --   RBC 2.92*  --  2.69* 2.97*  --  2.77*  --   --   HGB 8.4*   < > 7.5*  --    < > 7.8* 8.1* 8.1*  HCT 27.4*   < > 25.2*  --    < > 25.7* 26.7* 26.9*  MCV 93.8  --  93.7  --   --  92.8  --   --   MCH 28.8  --  27.9  --   --  28.2  --   --   MCHC 30.7  --  29.8*  --   --  30.4  --   --   RDW 16.9*  --  17.1*  --   --  17.0*  --   --   PLT 306  --  287  --   --  284  --   --    < > = values in this interval not displayed.    BNP Recent Labs  Lab 11/19/19 0510 11/23/19 1306  BNP 863.1* 867.6*     DDimer No results for input(s): DDIMER in the last 168 hours.   Radiology    No results found.  Cardiac Studies    Right Heart Catheterization 09/30/2019: Findings: RA = 13 RV = 63/16 PA = 62/23 (40) PCW = 27 (v=43) Fick cardiac output/index = 4.8/2.9 Thermo CO/CI = 2.3/1.4 PVR = 5.7 WU (Thermo) Ao sat = 95% PA sat = 54%, 56%  Assessment: 1. Markedly elevated biventricular filling pressures  Echocardiogram 09/27/2019: Impressions: 1. Left ventricular ejection fraction, by estimation, is 20 to 25%. The  left ventricle has severely decreased function. The left ventricle  demonstrates global hypokinesis. The left ventricular internal cavity size  was mildly dilated. There is mild  asymmetric left ventricular hypertrophy of the inferolateral segment. Left  ventricular diastolic parameters are consistent with Grade III diastolic  dysfunction (restrictive). Elevated left ventricular end-diastolic  pressure.  2. Right ventricular systolic function is normal. The right ventricular  size is normal. There is mildly elevated pulmonary artery systolic  pressure. The estimated right ventricular systolic pressure is 54.2 mmHg.  3. Left atrial size was moderately dilated.  4. The mitral valve is grossly normal. Mild mitral valve regurgitation  due to LV dysfunction with leaflet tenting.  5. Tricuspid valve regurgitation is moderate.  6. The aortic valve is abnormal. AV gradient may be underestimated due to  LV dysfunction.  7. The inferior vena cava is normal in size with <50% respiratory  variability, suggesting  right atrial pressure of 8 mmHg.   Patient Profile     64 y.o. female with a history of mild CAD on cath in 02/2019, chronic combined CHF with LVEF of 20-25% on Echo in 09/2019, chronic respiratory failure on 4L of O2 at home, prior CVA, hypertension, hyperlipidemia, type 2 diabetes on insulin, CKD stage IV, chronic anemia, and COVID pneumonia in 07/2018 who is being seen for CHF   Assessment & Plan    Acute on chronic combined systolic/diastolic heart failure, nonischemic  cardiomyopathy, biventricular failure-CKD 4 -She has had complete work-ups previously right and left heart cath with advanced heart failure team.  Previous hospitalization was on milrinone IV which was eventually weaned.  Discussions with palliative care team ensued. -Continue with hydralazine 12.5 3 times daily.  She has not been able to tolerate other medications. -Started coreg this admission, would favor holding for now in setting of decompensated heart failure -Creat is stable.  Lactate normal.  Oxygen requirement has returned to baseline.  Weight has not decreased but notably is at the weight she was discharged at last CHF exacerbation.  She reports feeling back to baseline.  Still with mild elevated JVD, though may be due to her TR.  Will switch to PO torsemide 40 mg BID today  Epigastric discomfort  --No CAD on Cath prior --GI related  Abdominal pain--main complaint -CT scan, potential inflammatory changes suggestive of proximal descending diverticulitis noted.  She is being treated with antibiotics. Neg CDIFF     For questions or updates, please contact [place] Please consult [email or web address] for contact info under        Signed, Donato Heinz, MD  11/25/2019, 10:00 AM

## 2019-11-25 NOTE — Care Management (Signed)
Case Manager received call from Andree Coss with [employer] concerning patient's home oxygen. She will need new saturations done and charted. Patient qualifies for [facility] if MD feels it is needed. Will need specific note and order for same. CM messaged MD.  [facility] Team will continue to monitor. Ricki Miller, RN BSN Case Manager

## 2019-11-25 NOTE — Progress Notes (Signed)
Occupational Therapy Treatment Patient Details Name: Cam Harnden MRN: [id number] DOB: 10/05/1955 Today's Date: 11/25/2019    History of present illness 64 y.o. female with a history of mild CAD on cath in 02/2019, chronic combined CHF with LVEF of 20-25% on Echo in 09/2019, chronic respiratory failure on 4L of O2 at home, prior CVA, hypertension, hyperlipidemia, type 2 diabetes on insulin, CKD stage IV, chronic anemia, and COVID pneumonia in 07/2018. Brought to ED by EMS and was satting 76%O2 at home on 4L O2 improved on nonrebreather. In ED weaned to 6L via [place]. Chest x-ray showed cardiomegaly with pulmonary edema and bilateral effusions. Admitted 11/19/19 for treatment of CHF.   OT comments  Patient continues to make steady progress towards goals in skilled OT session. Patient's session encompassed ADLs at the sink and functional ambulation in order to increase overall activity tolerance. Pt remains limited due to O2 sats and activity tolerance, requiring to be placed on 6L of O2 (originially bumped to 4 however as pt fatigued required increased O2 per sat reading) to complete tasks at sink. Pt offered seated rest breaks to complete ADLs at sink, but pt motivated to complete in standing (stood for about 7 minutes and began to lean on forearms at end despite encouragement from therapist). Discharge remains appropriate, will continue to follow acutely.    Follow Up Recommendations  Home health OT    Equipment Recommendations  None recommended by OT    Recommendations for Other Services      Precautions / Restrictions Precautions Precautions: Other (comment) Precaution Comments: watch O2 with movement Restrictions Weight Bearing Restrictions: No       Mobility Bed Mobility Overal bed mobility: Needs Assistance       Supine to sit: Min guard     General bed mobility comments: OOB in recliner on entry  Transfers Overall transfer level: Needs assistance Equipment used: Rolling  walker (2 wheeled) Transfers: Sit to/from [facility] Sit to Stand: Min guard Stand pivot transfers: Min guard       General transfer comment: min guard for safety with power up and steadying in RW,     Balance Overall balance assessment: Needs assistance Sitting-balance support: Feet supported;Bilateral upper extremity supported Sitting balance-Leahy Scale: Fair     Standing balance support: Bilateral upper extremity supported Standing balance-Leahy Scale: Poor Standing balance comment: requires UE support                            ADL either performed or assessed with clinical judgement   ADL Overall ADL's : Needs assistance/impaired     Grooming: Supervision/safety;Standing;Wash/dry hands;Wash/dry face;Oral care Grooming Details (indicate cue type and reason): Pt standing at sink, however as pt continued with ADLs elected to lean on forearms instead of take seated rest break         Upper Body Dressing : Minimal assistance;Sitting Upper Body Dressing Details (indicate cue type and reason): donning gown     Toilet Transfer: Min guard;Ambulation;RW;BSC   Toileting- Clothing Manipulation and Hygiene: Moderate assistance Toileting - Clothing Manipulation Details (indicate cue type and reason): Unable to balance to wipe from BM, however able to complete peri-care in front     Functional mobility during ADLs: Min guard;Rolling walker;Minimal assistance General ADL Comments: pt continues to make gains, largest limiting factor is activity tolerance and O2 levels     Vision       Perception     Praxis  Cognition Arousal/Alertness: Awake/alert Behavior During Therapy: WFL for tasks assessed/performed;Flat affect Overall Cognitive Status: Within Functional Limits for tasks assessed                                 General Comments: reports she is tired from all the medication         Exercises     Shoulder  Instructions       General Comments Pt on 3L O2 via [place] on entry with SaO2 100%O2, with ambulation SaO2 dropped to 85%O2, bumped to 4L O2 and pt able to ambulate maintaining SaO2 >90%O2. HR 88-100 bpm    Pertinent Vitals/ Pain       Pain Assessment: No/denies pain  Home Living                                          Prior Functioning/Environment              Frequency  Min 2X/week        Progress Toward Goals  OT Goals(current goals can now be found in the care plan section)  Progress towards OT goals: Progressing toward goals  Acute Rehab OT Goals Patient Stated Goal: feel better OT Goal Formulation: With patient Time For Goal Achievement: 12/06/19 Potential to Achieve Goals: Good  Plan Discharge plan remains appropriate    Co-evaluation                 AM-PAC OT "6 Clicks" Daily Activity     Outcome Measure   Help from another person eating meals?: A Little Help from another person taking care of personal grooming?: A Little Help from another person toileting, which includes using toliet, bedpan, or urinal?: A Little Help from another person bathing (including washing, rinsing, drying)?: A Little Help from another person to put on and taking off regular upper body clothing?: A Little Help from another person to put on and taking off regular lower body clothing?: A Little 6 Click Score: 18    End of Session Equipment Utilized During Treatment: Gait belt;Oxygen;Rolling walker  OT Visit Diagnosis: Unsteadiness on feet (R26.81);Other abnormalities of gait and mobility (R26.89);Muscle weakness (generalized) (M62.81)   Activity Tolerance Patient tolerated treatment well   Patient Left in chair;with call bell/phone within reach   Nurse Communication Mobility status        Time: 1129-1200 OT Time Calculation (min): 31 min  Charges: OT General Charges $OT Visit: 1 Visit OT Treatments $Self Care/Home Management : 23-37  mins  [place]. Karelly Dewalt, COTA/L Acute Rehabilitation Services [place] 11/25/2019, 2:39 PM

## 2019-11-25 NOTE — Progress Notes (Signed)
{  PT ALL NOTSATURATION QUALIFICATIONS: (This note is used to comply with regulatory documentation for home oxygen)  Patient Saturations on Room Air at Rest = 78%  Patient Saturations on Room Air while Ambulating = 78%  Patient Saturations on 4 Liters of oxygen while Ambulating = 95%  Please briefly explain why patient needs home oxygen: Pt requires 4L O2 via [place] for ambulation to maintain SaO2 >90%O2.  Psalm Schappell B. Migdalia Dk PT, DPT Acute Rehabilitation Services Pager [phone] Office [phone]

## 2019-11-25 NOTE — Progress Notes (Signed)
Physical Therapy Treatment Patient Details Name: Kara Hanson MRN: [id number] DOB: 09-Sep-1955 Today's Date: 11/25/2019    History of Present Illness 64 y.o. female with a history of mild CAD on cath in 02/2019, chronic combined CHF with LVEF of 20-25% on Echo in 09/2019, chronic respiratory failure on 4L of O2 at home, prior CVA, hypertension, hyperlipidemia, type 2 diabetes on insulin, CKD stage IV, chronic anemia, and COVID pneumonia in 07/2018. Brought to ED by EMS and was satting 76%O2 at home on 4L O2 improved on nonrebreather. In ED weaned to 6L via [place]. Chest x-ray showed cardiomegaly with pulmonary edema and bilateral effusions. Admitted 11/19/19 for treatment of CHF.    PT Comments    Pt states she is feeling tired "from all the medication.". Agreeable to working with therapy, however less conversant than prior session. Pt limited in safe mobility by increased O2 demand (see General Comments) and fatigue in presence of decreased strength and endurance. D/c plans remain appropriate at this time. PT will continue to follow acutely.    Follow Up Recommendations  Home health PT;Supervision/Assistance - 24 hour     Equipment Recommendations  None recommended by PT (already has RW)       Precautions / Restrictions Precautions Precautions: None Restrictions Weight Bearing Restrictions: No    Mobility  Bed Mobility               General bed mobility comments: OOB in recliner on entry  Transfers Overall transfer level: Needs assistance Equipment used: Rolling walker (2 wheeled) Transfers: Sit to/from [employer] Transfers Sit to Stand: Min guard         General transfer comment: min guard for safety with power up and steadying in RW,   Ambulation/Gait Ambulation/Gait assistance: Min assist;Min guard Gait Distance (Feet): 60 Feet Assistive device: Rolling walker (2 wheeled) Gait Pattern/deviations: Step-through pattern;Decreased step length - right;Decreased  step length - left;Shuffle;Antalgic Gait velocity: slowed Gait velocity interpretation: <1.31 ft/sec, indicative of household ambulator General Gait Details: min guard for straight line ambulation, pt with dizziness with turning around at end of hallway requiring minA for steadying         Balance Overall balance assessment: Needs assistance Sitting-balance support: Feet supported;Bilateral upper extremity supported Sitting balance-Leahy Scale: Fair     Standing balance support: Bilateral upper extremity supported Standing balance-Leahy Scale: Poor Standing balance comment: requires UE support                             Cognition Arousal/Alertness: Lethargic Behavior During Therapy: WFL for tasks assessed/performed;Flat affect Overall Cognitive Status: Within Functional Limits for tasks assessed                                 General Comments: reports she is tired from all the medication          General Comments General comments (skin integrity, edema, etc.): Pt on 3L O2 via [place] on entry with SaO2 100%O2, with ambulation SaO2 dropped to 85%O2, bumped to 4L O2 and pt able to ambulate maintaining SaO2 >90%O2. HR 88-100 bpm      Pertinent Vitals/Pain Pain Assessment: No/denies pain           PT Goals (current goals can now be found in the care plan section) Acute Rehab PT Goals Patient Stated Goal: feel better PT Goal Formulation: With patient Time For Goal  Achievement: 12/04/19 Potential to Achieve Goals: Good Progress towards PT goals: Progressing toward goals    Frequency    Min 3X/week      PT Plan Current plan remains appropriate       AM-PAC PT "6 Clicks" Mobility   Outcome Measure  Help needed turning from your back to your side while in a flat bed without using bedrails?: A Little Help needed moving from lying on your back to sitting on the side of a flat bed without using bedrails?: A Lot Help needed moving to and from  a bed to a chair (including a wheelchair)?: A Little Help needed standing up from a chair using your arms (e.g., wheelchair or bedside chair)?: A Little Help needed to walk in hospital room?: A Lot Help needed climbing 3-5 steps with a railing? : A Lot 6 Click Score: 15    End of Session Equipment Utilized During Treatment: Gait belt Activity Tolerance: Patient limited by fatigue Patient left: in chair;with call bell/phone within reach Nurse Communication: Mobility status PT Visit Diagnosis: Unsteadiness on feet (R26.81);Other abnormalities of gait and mobility (R26.89);Muscle weakness (generalized) (M62.81);Difficulty in walking, not elsewhere classified (R26.2);Pain     Time: 3094-0768 PT Time Calculation (min) (ACUTE ONLY): 18 min  Charges:  $Gait Training: 8-22 mins                     Alekhya Gravlin B. Migdalia Dk PT, DPT Acute Rehabilitation Services Pager [phone] Office [phone]    [place] 11/25/2019, 2:19 PM

## 2019-11-26 LAB — HEMOGLOBIN AND HEMATOCRIT, BLOOD
HCT: 26.4 % — ABNORMAL LOW (ref 36.0–46.0)
Hemoglobin: 7.8 g/dL — ABNORMAL LOW (ref 12.0–15.0)

## 2019-11-26 LAB — BASIC METABOLIC PANEL
Anion gap: 9 (ref 5–15)
BUN: 38 mg/dL — ABNORMAL HIGH (ref 8–23)
CO2: 24 mmol/L (ref 22–32)
Calcium: 9.1 mg/dL (ref 8.9–10.3)
Chloride: 107 mmol/L (ref 98–111)
Creatinine, Ser: 1.83 mg/dL — ABNORMAL HIGH (ref 0.44–1.00)
GFR calc Af Amer: 33 mL/min — ABNORMAL LOW (ref >60)
GFR calc non Af Amer: 29 mL/min — ABNORMAL LOW (ref >60)
Glucose, Bld: 92 mg/dL (ref 70–99)
Potassium: 3.4 mmol/L — ABNORMAL LOW (ref 3.5–5.1)
Sodium: 140 mmol/L (ref 135–145)

## 2019-11-26 LAB — GLUCOSE, CAPILLARY: Glucose-Capillary: 92 mg/dL (ref 70–99)

## 2019-11-26 MED ORDER — POTASSIUM CHLORIDE CRYS ER 20 MEQ PO TBCR
40.0000 meq | EXTENDED_RELEASE_TABLET | Freq: Once | ORAL | Status: AC
  Administered 2019-11-26: 40 meq via ORAL

## 2019-11-26 MED ORDER — AMOXICILLIN-POT CLAVULANATE 500-125 MG PO TABS: 1.0000 | ORAL_TABLET | Freq: Two times a day (BID) | ORAL | 0 refills | Status: DC

## 2019-11-26 MED ORDER — POTASSIUM CHLORIDE CRYS ER 20 MEQ PO TBCR: 20.0000 meq | EXTENDED_RELEASE_TABLET | Freq: Every day | ORAL | 0 refills | Status: DC

## 2019-11-26 NOTE — Discharge Instructions (Signed)
Patient will be discharged to home with home health services. Patient will need to follow up with primary care provider within one week of discharge, repeat BMP. Follow-up with cardiology. Patient should continue medications as prescribed. Patient should follow a heart healthy/carb modified diet.    Acute Respiratory Failure, Adult  Acute respiratory failure occurs when there is not enough oxygen passing from your lungs to your body. When this happens, your lungs have trouble removing carbon dioxide from the blood. This causes your blood oxygen level to drop too low as carbon dioxide builds up. Acute respiratory failure is a medical emergency. It can develop quickly, but it is temporary if treated promptly. Your lung capacity, or how much air your lungs can hold, may improve with time, exercise, and treatment. What are the causes? There are many possible causes of acute respiratory failure, including:  Lung injury.  Chest injury or damage to the ribs or tissues near the lungs.  Lung conditions that affect the flow of air and blood into and out of the lungs, such as pneumonia, acute respiratory distress syndrome, and cystic fibrosis.  Medical conditions, such as strokes or spinal cord injuries, that affect the muscles and nerves that control breathing.  Blood infection (sepsis).  Inflammation of the pancreas (pancreatitis).  A blood clot in the lungs (pulmonary embolism).  A large-volume blood transfusion.  Burns.  Near-drowning.  Seizure.  Smoke inhalation.  Reaction to medicines.  Alcohol or drug overdose. What increases the risk? This condition is more likely to develop in people who have:  A blocked airway.  Asthma.  A condition or disease that damages or weakens the muscles, nerves, bones, or tissues that are involved in breathing.  A serious infection.  A health problem that blocks the unconscious reflex that is involved in breathing, such as hypothyroidism  or sleep apnea.  A lung injury or trauma. What are the signs or symptoms? Trouble breathing is the main symptom of acute respiratory failure. Symptoms may also include:  Rapid breathing.  Restlessness or anxiety.  Skin, lips, or fingernails that appear blue (cyanosis).  Rapid heart rate.  Abnormal heart rhythms (arrhythmias).  Confusion or changes in behavior.  Tiredness or loss of energy.  Feeling sleepy or having a loss of consciousness. How is this diagnosed? Your health care provider can diagnose acute respiratory failure with a medical history and physical exam. During the exam, your health care provider will listen to your heart and check for crackling or wheezing sounds in your lungs. Your may also have tests to confirm the diagnosis and determine what is causing respiratory failure. These tests may include:  Measuring the amount of oxygen in your blood (pulse oximetry). The measurement comes from a small device that is placed on your finger, earlobe, or toe.  Other blood tests to measure blood gases and to look for signs of infection.  Sampling your cerebral spinal fluid or tracheal fluid to check for infections.  Chest X-ray to look for fluid in spaces that should be filled with air.  Electrocardiogram (ECG) to look at the heart's electrical activity. How is this treated? Treatment for this condition usually takes places in a hospital intensive care unit (ICU). Treatment depends on what is causing the condition. It may include one or more treatments until your symptoms improve. Treatment may include:  Supplemental oxygen. Extra oxygen is given through a tube in the nose, a face mask, or a hood.  A device such as a continuous positive airway pressure (  CPAP) or bi-level positive airway pressure (BiPAP or BPAP) machine. This treatment uses mild air pressure to keep the airways open. A mask or other device will be placed over your nose or mouth. A tube that is connected to  a motor will deliver oxygen through the mask.  Ventilator. This treatment helps move air into and out of the lungs. This may be done with a bag and mask or a machine. For this treatment, a tube is placed in your windpipe (trachea) so air and oxygen can flow to the lungs.  Extracorporeal membrane oxygenation (ECMO). This treatment temporarily takes over the function of the heart and lungs, supplying oxygen and removing carbon dioxide. ECMO gives the lungs a chance to recover. It may be used if a ventilator is not effective.  Tracheostomy. This is a procedure that creates a hole in the neck to insert a breathing tube.  Receiving fluids and medicines.  Rocking the bed to help breathing. Follow these instructions at home:  Take over-the-counter and prescription medicines only as told by your health care provider.  Return to normal activities as told by your health care provider. Ask your health care provider what activities are safe for you.  Keep all follow-up visits as told by your health care provider. This is important. How is this prevented? Treating infections and medical conditions that may lead to acute respiratory failure can help prevent the condition from developing. Contact a health care provider if:  You have a fever.  Your symptoms do not improve or they get worse. Get help right away if:  You are having trouble breathing.  You lose consciousness.  Your have cyanosis or turn blue.  You develop a rapid heart rate.  You are confused. These symptoms may represent a serious problem that is an emergency. Do not wait to see if the symptoms will go away. Get medical help right away. Call your local emergency services (911 in the [place] Do not drive yourself to the hospital. This information is not intended to replace advice given to you by your health care provider. Make sure you discuss any questions you have with your health care provider. Document Revised: 03/03/2017  Document Reviewed: 10/07/2015 Elsevier Patient Education  [place]. Heart Failure, Diagnosis  Heart failure means that your heart is not able to pump blood in the right way. This makes it hard for your body to work well. Heart failure is usually a long-term (chronic) condition. You must take good care of yourself and follow your treatment plan from your doctor. What are the causes? This condition may be caused by:  High blood pressure.  Build up of cholesterol and fat in the arteries.  Heart attack. This injures the heart muscle.  Heart valves that do not open and close properly.  Damage of the heart muscle. This is also called cardiomyopathy.  Lung disease.  Abnormal heart rhythms. What increases the risk? The risk of heart failure goes up as a person ages. This condition is also more likely to develop in people who:  Are overweight.  Are female.  Smoke or chew tobacco.  Abuse alcohol or illegal drugs.  Have taken medicines that can damage the heart.  Have diabetes.  Have abnormal heart rhythms.  Have thyroid problems.  Have low blood counts (anemia). What are the signs or symptoms? Symptoms of this condition include:  Shortness of breath.  Coughing.  Swelling of the feet, ankles, legs, or belly.  Losing weight for no reason.  Trouble breathing.  Waking from sleep because of the need to sit up and get more air.  Rapid heartbeat.  Being very tired.  Feeling dizzy, or feeling like you may pass out (faint).  Having no desire to eat.  Feeling like you may vomit (nauseous).  Peeing (urinating) more at night.  Feeling confused. How is this treated?     This condition may be treated with:  Medicines. These can be given to treat blood pressure and to make the heart muscles stronger.  Changes in your daily life. These may include eating a healthy diet, staying at a healthy body weight, quitting tobacco and illegal drug use, or doing  exercises.  Surgery. Surgery can be done to open blocked valves, or to put devices in the heart, such as pacemakers.  A donor heart (heart transplant). You will receive a healthy heart from a donor. Follow these instructions at home:  Treat other conditions as told by your doctor. These may include high blood pressure, diabetes, thyroid disease, or abnormal heart rhythms.  Learn as much as you can about heart failure.  Get support as you need it.  Keep all follow-up visits as told by your doctor. This is important. Summary  Heart failure means that your heart is not able to pump blood in the right way.  This condition is caused by high blood pressure, heart attack, or damage of the heart muscle.  Symptoms of this condition include shortness of breath and swelling of the feet, ankles, legs, or belly. You may also feel very tired or feel like you may vomit.  You may be treated with medicines, surgery, or changes in your daily life.  Treat other health conditions as told by your doctor. This information is not intended to replace advice given to you by your health care provider. Make sure you discuss any questions you have with your health care provider. Document Revised: 06/08/2018 Document Reviewed: 06/08/2018 Elsevier Patient Education  [place].

## 2019-11-26 NOTE — Progress Notes (Signed)
Progress Note  Patient Name: Kara Hanson Date of Encounter: 11/26/2019  [facility] Cardiologist: Candee Furbish, MD / Dr. Haroldine Laws  Subjective   I/Os not recorded.  SpO2 98% on 2L (had been on 4L baseline at home).  Renal function stable (creatinine 1.9 > 2.0 > 1.8 > 1.9 >1.8>1.7).  Weight unchanged (142 lbs). Reports dyspnea improving, states that she feels back to her baseline.  Inpatient Medications    Scheduled Meds: . aspirin EC  81 mg Oral q AM  . atorvastatin  80 mg Oral QHS  . DULoxetine  30 mg Oral QHS  . fluticasone  2 spray Each Nare Daily  . gabapentin  100 mg Oral BID  . heparin  5,000 Units Subcutaneous Q8H  . hydrALAZINE  12.5 mg Oral TID  . insulin aspart  0-9 Units Subcutaneous TID WC  . insulin glargine  5 Units Subcutaneous Daily  . pantoprazole  20 mg Oral Daily  . primidone  50 mg Oral BID  . sodium chloride flush  3 mL Intravenous Q12H  . torsemide  40 mg Oral BID   Continuous Infusions: . sodium chloride    . cefTRIAXone (ROCEPHIN)  IV 2 g (11/25/19 2225)  . metronidazole 500 mg (11/26/19 0938)   PRN Meds: sodium chloride, acetaminophen, guaiFENesin-dextromethorphan, loperamide, ondansetron (ZOFRAN) IV, sodium chloride flush   Vital Signs    Vitals:   11/25/19 2351 11/26/19 0450 11/26/19 0820 11/26/19 0850  BP: 120/72 132/67 136/67   Pulse:  86 82   Resp: 18 20 18    Temp: 98 F (36.7 C) 98.3 F (36.8 C) 98.1 F (36.7 C)   TempSrc: Oral Oral Oral   SpO2:  96% 94% 98%  Weight:      Height:        Intake/Output Summary (Last 24 hours) at 11/26/2019 1005 Last data filed at 11/26/2019 0000 Gross per 24 hour  Intake 688.56 ml  Output --  Net 688.56 ml   Last 3 Weights 11/25/2019 11/24/2019 11/23/2019  Weight (lbs) 141 lb 14.9 oz 141 lb 8.2 oz 142 lb  Weight (kg) 64.38 kg 64.19 kg 64.411 kg      Telemetry    Sinus rhythm, PVCs- Personally Reviewed  ECG    No new- Personally Reviewed  Physical Exam   GEN: Well nourished,  well developed, in no acute distress  HEENT: normal  Neck: mild JVD, carotid bruits, or masses Cardiac: RRR; no murmurs, rubs, or gallops,no edema  Respiratory:  CTAB GI: soft, nontender, nondistended, + BS MS: no deformity or atrophy  Skin: warm and dry, no rash Neuro:  Alert and Oriented x 3, Strength and sensation are intact Psych: euthymic mood, full affect   Labs    High Sensitivity Troponin:   Recent Labs  Lab 11/19/19 0510  TROPONINIHS 21*      Chemistry Recent Labs  Lab 11/19/19 2217 11/20/19 0644 11/23/19 0201 11/24/19 0506 11/25/19 0609  NA  --    < > 139 138 138  K  --    < > 3.5 3.5 3.5  CL  --    < > 105 105 103  CO2  --    < > 24 22 22   GLUCOSE  --    < > 85 140* 81  BUN  --    < > 51* 46* 42*  CREATININE  --    < > 1.93* 1.80* 1.72*  CALCIUM  --    < > 9.4 8.9 9.1  PROT 6.1*  --   --   --   --   ALBUMIN 2.7*  --   --   --   --   AST 12*  --   --   --   --   ALT 12  --   --   --   --   ALKPHOS 80  --   --   --   --   BILITOT 0.4  --   --   --   --   GFRNONAA  --    < > 27* 29* 31*  GFRAA  --    < > 31* 34* 36*  ANIONGAP  --    < > 10 11 13    < > = values in this interval not displayed.     Hematology Recent Labs  Lab 11/21/19 0439 11/21/19 5852 11/21/19 1929 11/22/19 0430 11/22/19 0430 11/23/19 0201 11/24/19 0506 11/26/19 0851  WBC 5.3  --   --  4.6  --   --   --   --   RBC 2.69* 2.97*  --  2.77*  --   --   --   --   HGB 7.5*  --    < > 7.8*   < > 8.1* 8.1* 7.8*  HCT 25.2*  --    < > 25.7*   < > 26.7* 26.9* 26.4*  MCV 93.7  --   --  92.8  --   --   --   --   MCH 27.9  --   --  28.2  --   --   --   --   MCHC 29.8*  --   --  30.4  --   --   --   --   RDW 17.1*  --   --  17.0*  --   --   --   --   PLT 287  --   --  284  --   --   --   --    < > = values in this interval not displayed.    BNP Recent Labs  Lab 11/23/19 1306  BNP 867.6*     DDimer No results for input(s): DDIMER in the last 168 hours.   Radiology    No results  found.  Cardiac Studies   Right Heart Catheterization 09/30/2019: Findings: RA = 13 RV = 63/16 PA = 62/23 (40) PCW = 27 (v=43) Fick cardiac output/index = 4.8/2.9 Thermo CO/CI = 2.3/1.4 PVR = 5.7 WU (Thermo) Ao sat = 95% PA sat = 54%, 56%  Assessment: 1. Markedly elevated biventricular filling pressures  Echocardiogram 09/27/2019: Impressions: 1. Left ventricular ejection fraction, by estimation, is 20 to 25%. The  left ventricle has severely decreased function. The left ventricle  demonstrates global hypokinesis. The left ventricular internal cavity size  was mildly dilated. There is mild  asymmetric left ventricular hypertrophy of the inferolateral segment. Left  ventricular diastolic parameters are consistent with Grade III diastolic  dysfunction (restrictive). Elevated left ventricular end-diastolic  pressure.  2. Right ventricular systolic function is normal. The right ventricular  size is normal. There is mildly elevated pulmonary artery systolic  pressure. The estimated right ventricular systolic pressure is 77.8 mmHg.  3. Left atrial size was moderately dilated.  4. The mitral valve is grossly normal. Mild mitral valve regurgitation  due to LV dysfunction with leaflet tenting.  5. Tricuspid valve regurgitation is moderate.  6. The aortic valve is abnormal.  AV gradient may be underestimated due to  LV dysfunction.  7. The inferior vena cava is normal in size with <50% respiratory  variability, suggesting right atrial pressure of 8 mmHg.   Patient Profile     64 y.o. female with a history of mild CAD on cath in 02/2019, chronic combined CHF with LVEF of 20-25% on Echo in 09/2019, chronic respiratory failure on 4L of O2 at home, prior CVA, hypertension, hyperlipidemia, type 2 diabetes on insulin, CKD stage IV, chronic anemia, and COVID pneumonia in 07/2018 who is being seen for CHF   Assessment & Plan    Acute on chronic combined systolic/diastolic heart  failure, nonischemic cardiomyopathy, biventricular failure-CKD 4 -She has had complete work-ups previously right and left heart cath with advanced heart failure team.  Previous hospitalization was on milrinone IV which was eventually weaned.  Discussions with palliative care team ensued. -Continue with hydralazine 12.5 3 times daily.  She has not been able to tolerate other medications. -Started coreg this admission, would favor holding given decompensated heart failure -Creat is stable.  Lactate normal.  Oxygen requirement has returned to baseline.  Weight has not decreased but notably is at the weight she was discharged at last CHF exacerbation.  She reports feeling back to baseline and wants to go home.  Will need close monitoring in hospital at home program.  Continue PO torsemide 40 mg BID today  Epigastric discomfort  --No CAD on Cath prior --GI related  Abdominal pain--main complaint -CT scan, potential inflammatory changes suggestive of proximal descending diverticulitis noted.  She is being treated with antibiotics. Neg CDIFF  [facility] will sign off.   Medication Recommendations:  Torsemide 40 mg BID, hydralazine 12.5 mg TID Other recommendations (labs, testing, etc):  BMET within 1 week.  Needs hospital at home program for close monitoring. Follow up as an outpatient:  Appointment 9/1 with Dr Marlou Porch     For questions or updates, please contact [place] Please consult [email or web address] for contact info under        Signed, Donato Heinz, MD  11/26/2019, 10:05 AM

## 2019-11-26 NOTE — TOC Transition Note (Addendum)
Transition of Care [facility]) - CM/SW Discharge Note   Patient Details  Name: Kara Hanson MRN: [id number] Date of Birth: 1956/01/28  Transition of Care [facility]) CM/SW Contact:  Bethena Roys, RN Phone Number: 11/26/2019, 10:53 AM   Clinical Narrative: Patient will transition home today. Previous Case Manager set patient up with home health services via [place]. Case Manager did call [facility] to make them aware that patient will transition home. Patient states her son will provide transportation home-staff RN did call the son to have him bring the patient's oxygen. Case Manager @ the [facility] is assisting the patient with PCS. No further needs from this Case Manager   [phone] 11-26-19 Patient has been followed by [facility] prior to admission. Case Manager called the [facility] and they will continue to be follow outpatient. No further needs from Case Manager at this time.   Final next level of care: [place] Barriers to Discharge: Continued Medical Work up  Patient Goals and CMS Choice Patient states their goals for this hospitalization and ongoing recovery are:: get better CMS Medicare.gov Compare Post Acute Care list provided to:: Patient Choice offered to / list presented to : Patient   Discharge Plan and Services   Discharge Planning Services: CM Consult Post Acute Care Choice: Home Health             HH Arranged: RN, PT, OT [facility] Agency: [place] Date [place]: 11/21/19 Time HH Agency Contacted: 1100 Representative spoke with at [place]: Adela Lank   Readmission Risk Interventions Readmission Risk Prevention Plan 10/03/2019 05/22/2019 02/11/2019  Transportation Screening Complete Complete Complete  PCP or Specialist Appt within 3-5 Days - - -  Not Complete comments - - -  [place] or [place] for [place] Planning/Counseling - - -  [place] - - -  Medication Review Press photographer) Complete Complete Complete  PCP or Specialist appointment within 3-5 days of discharge - Complete Complete  [place] or [place] Complete Complete Complete  SW Recovery Care/Counseling Consult Complete Complete Complete  Palliative Care Screening Not Applicable Not Applicable Not [place] Not Applicable Not Applicable Not Applicable  Some recent data might be hidden

## 2019-11-26 NOTE — Discharge Summary (Addendum)
Physician Discharge Summary  Kara Hanson BWF:[id number] DOB: 03-26-1956 DOA: 11/19/2019  PCP: Hoy Register, MD  Admit date: 11/19/2019 Discharge date: 11/26/2019  Time spent: 45 minutes  Recommendations for Outpatient Follow-up:  Patient will be discharged to home with home health services.  Patient will need to follow up with primary care provider within one week of discharge, repeat BMP.  Follow-up with cardiology.  Patient should continue medications as prescribed.  Patient should follow a heart healthy/carb modified diet.   Discharge Diagnoses:  Acute on chronic respiratory failure with hypoxia secondary to Acute combined systolic and diastolicCHF exacerbation Abdominal pain with diarrhea Diabetes mellitus type 2, uncontrolled with hyperglycemia and peripheral neuropathy Essential hypertension Chronic kidney disease stage IV Anemia of chronic disease/normocytic anemia  Hyperlipidemia Deconditioning  Discharge Condition: Stable  Diet recommendation: Heart healthy/carb modified  Filed Weights   11/23/19 0429 11/24/19 0343 11/25/19 0448  Weight: 64.4 kg 64.2 kg 64.4 kg    History of present illness:  On 11/19/2019 by Dr. Luther Parody Reichardis a 64 y.o.femalewith medical history significant ofhypertension, systolic congestive heart failure(last EF20-25%), insulin-dependent diabetes mellitus, chronic hypoxic respiratory failure on 4 L of nasal cannula oxygen, pneumonia due to COVID-19/2020, chronic kidney disease stage IIIpresents with complaints of being unable woken up out of her sleep unable to breathe. Reported associated symptoms of chest tightness, epigastric abdominal discomfort, abdominal distention, and nausea. She has been compliant with all of her home medications. Denies any fever, chills, cough, weight gain, leg swelling, vomiting, dysuria, diarrhea, or constipation. She reports that is hard to take deep inspiratory breath. Her last bowel movement  was yesterday.  EMS noted that the patient was satting 76% on home oxygen of 4 L and improved after being placed on a nonrebreather.  Hospital Course:  Acute on chronic respiratory failure with hypoxia secondary to Acute combined systolic and diastolicCHF exacerbation -Patient continues to require 7 L of oxygen (usually on 4 L at home)- was able to wean O2 to 4L -Patient was noted to be hypoxic on admission -BNP 863.1; chest x-ray revealed pulmonary edema with bilateral effusions -Of note patient was recently hospitalized for CHF exacerbation back in June 2021 -Echocardiogram 09/27/2019 showed an EF of 20-25%, grade 3 diastolic dysfunction -Patient recently had undergone right heart catheterization showing elevated biventricular filling pressures -Was placed on on IV Lasix 80 mg twice dailyand transitioned to torsemide 40 mg twice daily -Coreg held by cardiology -Continue to monitor intake and output, daily weights -Discussed with Dr. Bjorn Pippin, cardiology, patient transition back to oral torsemide, can be discharged -O2 saturation on room air at rest and with ambulation 78%, on 4L with ambulation was 95%  Abdominal pain with diarrhea secondary to diverticulitis -Continues to complain of abdominal pain and states had loose stool yesterday -CT abdomen pelvis showed focal acute uncomplicated diverticulitis of the proximal descending colon, moderate bilateral pleural effusions with dense areas of consolidation at lung bases -C diff unremarkable  -Was placed on IV ceftriaxone and flagyl- will discharge with additional 4 days of Augmentin -Abdominal x-ray obtained today showing no obstruction or free air.  Moderate stool. -continue imodium PRN  Diabetes mellitus type 2, uncontrolled with hyperglycemia and peripheral neuropathy -Presented with initial glucose of 180  -Last hemoglobin A1c 7.1 on 09/26/2019  -Home medications including glargine 5 units daily and sliding scale with  meals. -Continue gabapentin for neuropathy -Continue Lantus, insulin sliding scale and CBG monitoring  Essential hypertension -Continue hydralazine, Lasix -Coreg held by cardiology  Chronic kidney disease  stage IV -Creatinine currently 1.83 and appears to be stable -Repeat BMP in 1 week  Anemia of chronic disease/normocytic anemia  -Hemoglobin dropped to 7.8 today (baseline 8-9) -anemia panel obtained showing iron of 47, ferritin 246, saturation ratio 24 -FOBT negative -Pete CBC in 1 week  Hyperlipidemia -Continue statin  Deconditioning -PT and OT recommended home health  Procedures: None  Consultations: Cardiology  Discharge Exam: Vitals:   11/26/19 0820 11/26/19 0850  BP: 136/67   Pulse: 82   Resp: 18   Temp: 98.1 F (36.7 C)   SpO2: 94% 98%     General: Well developed, well nourished, NAD, appears stated age  HEENT: NCAT, mucous membranes moist.   Cardiovascular: S1 S2 auscultated, RRR, no murmur  Respiratory: diminished breath sounds, no crackles or wheezing  Abdomen: Soft, nontender, nondistended, + bowel sounds  Extremities: warm dry without cyanosis clubbing or edema  Neuro: AAOx3, nonfocal  Psych: Appropriate mood and affect, pleasant  Discharge Instructions Discharge Instructions    AMB Referral to [facility]   Complete by: As directed    Care Coordination:  Please assign to [facility] Coordinator for complex care and disease management follow up calls and assess for further needs.Patient active with [facility] and looks like [facility] as well. TOC with [facility], she had started working on personal care services with [employer].  She is Medicare and extreme high risk. Currently, home health with [facility]. Questions please call:   Natividad Brood, RN BSN [facility] Liaison  [phone] business mobile phone Toll free office [phone]  Fax number:  [phone] [email or web address]   Reason for consult: Care Coordination - high risk   Diagnoses of: Heart Failure   Expected date of contact: 1-3 days (reserved for hospital discharges)   Discharge instructions   Complete by: As directed    Patient will be discharged to home with home health services.  Patient will need to follow up with primary care provider within one week of discharge, repeat BMP.  Follow-up with cardiology.  Patient should continue medications as prescribed.  Patient should follow a heart healthy/carb modified diet.     Allergies as of 11/26/2019      Reactions   Garlic Shortness Of Breath, Itching, Swelling, Other (See Comments)   "Raw garlic" = Hand itching and swelling   Isosorbide Other (See Comments)   Sneezing and runny nose   Latex Itching   Morphine And Related Itching, Other (See Comments)   Headache   Other Itching, Other (See Comments)   Reaction to newspaper ink- Headaches, also      Medication List    TAKE these medications   Accu-Chek FastClix Lancets Misc USE AS DIRECTED TO TEST BLOOD SUGAR THREE TIMES DAILY   Accu-Chek Guide test strip Generic drug: glucose blood USE AS DIRECTED TO TEST BLOOD SUGAR THREE TIMES DAILY   Accu-Chek Guide w/Device Kit 1 each by Does not apply route 3 (three) times daily.   acetaminophen 500 MG tablet Commonly known as: TYLENOL Take 500-1,000 mg by mouth every 6 (six) hours as needed for mild pain or headache.   allopurinol 100 MG tablet Commonly known as: ZYLOPRIM Take 2 tablets (200 mg total) by mouth at bedtime.   amoxicillin-clavulanate 500-125 MG tablet Commonly known as: Augmentin Take 1 tablet (500 mg total) by mouth in the morning and at bedtime for 4 days.   aspirin EC 81 MG tablet Take 81 mg by mouth in the morning.  atorvastatin 80 MG tablet Commonly known as: LIPITOR Take 80 mg by mouth at bedtime.   Basaglar KwikPen 100 UNIT/ML Inject 0.05  mLs (5 Units total) into the skin daily.   Dexcom G6 Receiver Devi Use 1 each as directed   Dexcom G6 Sensor Misc Use 1 each as directed   Dexcom G6 Transmitter Misc Use 1 each as directed   DULoxetine 30 MG capsule Commonly known as: CYMBALTA Take 1 capsule (30 mg total) by mouth daily. What changed: when to take this   Easy Comfort Pen Needles 31G X 5 MM Misc Generic drug: Insulin Pen Needle USE FOUR TIMES PER DAY FOR INSULIN ADMINISTRATION   fluticasone 50 MCG/ACT nasal spray Commonly known as: FLONASE Place 2 sprays into both nostrils daily.   gabapentin 100 MG capsule Commonly known as: NEURONTIN TAKE 1 CAPSULE (100 MG TOTAL) BY MOUTH 2 (TWO) TIMES DAILY. What changed: See the new instructions.   hydrALAZINE 25 MG tablet Commonly known as: APRESOLINE Take 0.5 tablets (12.5 mg total) by mouth 3 (three) times daily.   insulin aspart 100 UNIT/ML injection Commonly known as: novoLOG 0 to 12 units subcutaneously 3 times daily before meals as per sliding scale   Lancet Device Misc Use as instructed 3 times daily   lansoprazole 15 MG capsule Commonly known as: PREVACID TAKE 1 CAPSULE (15 MG TOTAL) BY MOUTH DAILY. What changed: See the new instructions.   Misc. Devices Misc Portable oxygen concentrator.  Diagnosis-chronic respiratory failure.   Misc. Devices Misc Rollaor with seat. Dx: Congestive Heart Failure   Misc. Devices Misc Scale; Dx - CHF   Misc. [facility] bed.  Diagnosis CHF.  Lifetime use.  Weight 165 lbs.   Misc. Scientist, physiological chair DX CHF   Misc. Devices Misc Manual wheelchai with cushion, anti - tippers, brakes and lock.  Diagnosis congestive heart failure.  Weight 140 pounds.   multivitamin with minerals tablet Take 1 tablet by mouth daily with breakfast.   OXYGEN Inhale 4 L/min into the lungs continuous.   polyethylene glycol 17 g packet Commonly known as: MIRALAX / GLYCOLAX Take 17 g by mouth daily as needed for  moderate constipation.   potassium chloride SA 20 MEQ tablet Commonly known as: KLOR-CON Take 1 tablet (20 mEq total) by mouth daily for 7 days.   primidone 50 MG tablet Commonly known as: MYSOLINE Take 1 tablet (50 mg total) by mouth in the morning and at bedtime.   Super B Complex/C Caps Take 1 capsule by mouth daily.   torsemide 20 MG tablet Commonly known as: DEMADEX Take 2 tablets (40 mg total) by mouth 2 (two) times daily.   Vitamin D (Ergocalciferol) 1.25 MG (50000 UNIT) Caps capsule Commonly known as: DRISDOL Take 50,000 Units by mouth every Sunday.      Allergies  Allergen Reactions  . Garlic Shortness Of Breath, Itching, Swelling and Other (See Comments)    "Raw garlic" = Hand itching and swelling  . Isosorbide Other (See Comments)    Sneezing and runny nose  . Latex Itching  . Morphine And Related Itching and Other (See Comments)    Headache   . Other Itching and Other (See Comments)    Reaction to newspaper ink- Headaches, also    Follow-up Information    Care, [facility] Follow up.   Specialty: [place] Why: A representative from [facility] will contact you to arrange start date and time for your services. Contact information:  [address] [place] [address] [phone]        [name], MD. Schedule an appointment as soon as possible for a visit in 1 week(s).   Specialty: Family Medicine Why: Hospital follow up Contact information: [place]  [address] [phone]        Jerline Pain, MD .   Specialty: Cardiology Contact information: [phone] [address] [place] [address] [phone]                The results of significant diagnostics from this hospitalization (including imaging, microbiology, ancillary and laboratory) are listed below for reference.    Significant Diagnostic Studies: CT ABDOMEN PELVIS WO CONTRAST  Result Date:  11/19/2019 CLINICAL DATA:  Abdominal distension EXAM: CT ABDOMEN AND PELVIS WITHOUT CONTRAST TECHNIQUE: Multidetector CT imaging of the abdomen and pelvis was performed following the standard protocol without IV contrast. COMPARISON:  01/24/2019 FINDINGS: Lower chest: Moderate bilateral pleural effusions are noted, with dense areas of consolidation at the lung bases and bilateral air bronchograms, without significant change since prior CT. Heart is enlarged without pericardial effusion. Hepatobiliary: No focal liver abnormality is seen. Status post cholecystectomy. No biliary dilatation. Pancreas: Unremarkable. No pancreatic ductal dilatation or surrounding inflammatory changes. Spleen: Normal in size without focal abnormality. Adrenals/Urinary Tract: No urinary tract calculi or obstructive uropathy. Bladder is unremarkable with no filling defects. Adrenals are normal. Stomach/Bowel: No bowel obstruction or ileus. Normal appendix right lower quadrant. There is diverticulosis of the descending colon, with focal areas of inflammatory change along the anteromedial aspect proximal descending colon, reference images 47-50/2, consistent with focal acute uncomplicated diverticulitis. Vascular/Lymphatic: Aortic atherosclerosis. No enlarged abdominal or pelvic lymph nodes. Reproductive: Stable uterine fibroids. No adnexal masses. Other: No free fluid or free gas within the abdomen. Fat containing midline ventral hernia unchanged. No bowel herniation. Musculoskeletal: No acute or destructive bony lesions. Reconstructed images demonstrate no additional findings. IMPRESSION: 1. Focal acute uncomplicated diverticulitis of the proximal descending colon. 2. Moderate bilateral pleural effusions with dense areas of consolidation at the lung bases and bilateral air bronchograms, without significant change since prior CT. 3. Aortic Atherosclerosis (ICD10-I70.0). Electronically Signed   By: Randa Ngo M.D.   On: 11/19/2019 16:15    DG Abd 1 View  Result Date: 11/20/2019 CLINICAL DATA:  Abdominal pain with diarrhea EXAM: ABDOMEN - 1 VIEW COMPARISON:  CT abdomen and pelvis November 19, 2019 FINDINGS: There is moderate stool in the colon. There is no bowel dilatation or air-fluid level to suggest bowel obstruction. No free air. There are surgical clips in the gallbladder fossa region. There are small apparent phleboliths in the pelvis. IMPRESSION: No bowel obstruction or free air evident.  Moderate stool in colon. Electronically Signed   By: Lowella Grip III M.D.   On: 11/20/2019 09:57   DG Chest Portable 1 View  Result Date: 11/19/2019 CLINICAL DATA:  Shortness of breath, lethargic EXAM: PORTABLE CHEST 1 VIEW COMPARISON:  Radiograph 09/26/2019 FINDINGS: Central vascular congestion with diffusely increased hazy interstitial opacities and more patchy and confluent infrahilar opacity partially silhouetting the lung bases likely with bilateral effusions. Fissural and septal thickening is noted as well. No pneumothorax. Mild cardiomegaly similar to comparison imaging accounting for differences in technique. The aorta is calcified. The remaining cardiomediastinal contours are unremarkable. No acute osseous or soft tissue abnormality. Degenerative changes are present in the imaged spine and shoulders. Telemetry leads overlie the chest. IMPRESSION: Features of pulmonary edema with bilateral  effusions. More patchy and confluent infrahilar opacity could reflect developing alveolar edema or infection/consolidation. Cardiomegaly similar to priors. Aortic Atherosclerosis (ICD10-I70.0). Electronically Signed   By: Lovena Le M.D.   On: 11/19/2019 05:07    Microbiology: Recent Results (from the past 240 hour(s))  SARS Coronavirus 2 by RT PCR (hospital order, performed in [facility] hospital lab) Nasopharyngeal Nasopharyngeal Swab     Status: None   Collection Time: 11/19/19  4:39 AM   Specimen: Nasopharyngeal Swab  Result Value Ref  Range Status   SARS Coronavirus 2 NEGATIVE NEGATIVE Final    Comment: (NOTE) SARS-CoV-2 target nucleic acids are NOT DETECTED.  The SARS-CoV-2 RNA is generally detectable in upper and lower respiratory specimens during the acute phase of infection. The lowest concentration of SARS-CoV-2 viral copies this assay can detect is 250 copies / mL. A negative result does not preclude SARS-CoV-2 infection and should not be used as the sole basis for treatment or other patient management decisions.  A negative result may occur with improper specimen collection / handling, submission of specimen other than nasopharyngeal swab, presence of viral mutation(s) within the areas targeted by this assay, and inadequate number of viral copies (<250 copies / mL). A negative result must be combined with clinical observations, patient history, and epidemiological information.  Fact Sheet for Patients:   StrictlyIdeas.no  Fact Sheet for Healthcare Providers: BankingDealers.co.za  This test is not yet approved or  cleared by the [place] FDA and has been authorized for detection and/or diagnosis of SARS-CoV-2 by FDA under an Emergency Use Authorization (EUA).  This EUA will remain in effect (meaning this test can be used) for the duration of the COVID-19 declaration under Section 564(b)(1) of the Act, 21 U.S.C. section 360bbb-3(b)(1), unless the authorization is terminated or revoked sooner.  Performed at [facility], [place] [address] [place], [place] [address]   C Difficile Quick Screen w PCR reflex     Status: Abnormal   Collection Time: 11/21/19  1:36 PM   Specimen: STOOL  Result Value Ref Range Status   C Diff antigen POSITIVE (A) NEGATIVE Final   C Diff toxin NEGATIVE NEGATIVE Final   C Diff interpretation Results are indeterminate. See PCR results.  Final    Comment: Performed at [facility], [place] [address] [place], [place]  [address]  C. Diff by PCR, Reflexed     Status: None   Collection Time: 11/21/19  1:36 PM  Result Value Ref Range Status   Toxigenic C. Difficile by PCR NEGATIVE NEGATIVE Final    Comment: Patient is colonized with non toxigenic C. difficile. May not need treatment unless significant symptoms are present. Performed at [facility], [place] [address], [place], [place] [address]      Labs: Basic Metabolic Panel: Recent Labs  Lab 11/22/19 0430 11/23/19 0201 11/24/19 0506 11/25/19 0609 11/26/19 0851  NA 137 139 138 138 140  K 3.5 3.5 3.5 3.5 3.4*  CL 103 105 105 103 107  CO2 24 24 22 22 24   GLUCOSE 123* 85 140* 81 92  BUN 53* 51* 46* 42* 38*  CREATININE 1.80* 1.93* 1.80* 1.72* 1.83*  CALCIUM 9.2 9.4 8.9 9.1 9.1   Liver Function Tests: Recent Labs  Lab 11/19/19 2217  AST 12*  ALT 12  ALKPHOS 80  BILITOT 0.4  PROT 6.1*  ALBUMIN 2.7*   Recent Labs  Lab 11/19/19 2217  LIPASE 23   No results for input(s): AMMONIA  in the last 168 hours. CBC: Recent Labs  Lab 11/21/19 0439 11/21/19 0439 11/21/19 1929 11/22/19 0430 11/23/19 0201 11/24/19 0506 11/26/19 0851  WBC 5.3  --   --  4.6  --   --   --   HGB 7.5*   < > 8.4* 7.8* 8.1* 8.1* 7.8*  HCT 25.2*   < > 28.2* 25.7* 26.7* 26.9* 26.4*  MCV 93.7  --   --  92.8  --   --   --   PLT 287  --   --  284  --   --   --    < > = values in this interval not displayed.   Cardiac Enzymes: No results for input(s): CKTOTAL, CKMB, CKMBINDEX, TROPONINI in the last 168 hours. BNP: BNP (last 3 results) Recent Labs    10/24/19 1203 11/19/19 0510 11/23/19 1306  BNP 728.3* 863.1* 867.6*    ProBNP (last 3 results) No results for input(s): PROBNP in the last 8760 hours.  CBG: Recent Labs  Lab 11/25/19 0744 11/25/19 1206 11/25/19 1646 11/25/19 2051 11/26/19 0856  GLUCAP 116* 133* 100* 175* 92       Signed:  Berlie Hatchel  [facility] 11/26/2019, 10:43 AM

## 2019-11-26 NOTE — Care Management Important Message (Signed)
Important Message  Patient Details  Name: Phoenix Riesen MRN: [id number] Date of Birth: 1955/11/05   Medicare Important Message Given:  Yes Pt. already d/c mailed IM letter to pt.home.    Holli Humbles Smith 11/26/2019, 2:03 PM

## 2019-11-27 ENCOUNTER — Inpatient Hospital Stay (HOSPITAL_COMMUNITY)
Admission: EM | Admit: 2019-11-27 | Discharge: 2019-12-02 | DRG: 291 | Disposition: A | Discharge status: Hospice | Payer: Medicare Other | Attending: Internal Medicine | Admitting: Internal Medicine

## 2019-11-27 ENCOUNTER — Encounter (HOSPITAL_COMMUNITY): Payer: Self-pay | Admitting: Emergency Medicine

## 2019-11-27 ENCOUNTER — Emergency Department (HOSPITAL_COMMUNITY): Payer: Medicare Other

## 2019-11-27 ENCOUNTER — Other Ambulatory Visit: Payer: Self-pay | Admitting: *Deleted

## 2019-11-27 ENCOUNTER — Other Ambulatory Visit: Payer: Self-pay

## 2019-11-27 ENCOUNTER — Telehealth: Payer: Self-pay

## 2019-11-27 ENCOUNTER — Other Ambulatory Visit (HOSPITAL_COMMUNITY): Payer: Self-pay

## 2019-11-27 DIAGNOSIS — I130 Hypertensive heart and chronic kidney disease with heart failure and stage 1 through stage 4 chronic kidney disease, or unspecified chronic kidney disease: Secondary | ICD-10-CM | POA: Diagnosis present

## 2019-11-27 DIAGNOSIS — Z794 Long term (current) use of insulin: Secondary | ICD-10-CM | POA: Diagnosis not present

## 2019-11-27 DIAGNOSIS — E1122 Type 2 diabetes mellitus with diabetic chronic kidney disease: Secondary | ICD-10-CM | POA: Diagnosis present

## 2019-11-27 DIAGNOSIS — N184 Chronic kidney disease, stage 4 (severe): Secondary | ICD-10-CM | POA: Diagnosis present

## 2019-11-27 DIAGNOSIS — I5043 Acute on chronic combined systolic (congestive) and diastolic (congestive) heart failure: Secondary | ICD-10-CM | POA: Diagnosis present

## 2019-11-27 DIAGNOSIS — R52 Pain, unspecified: Secondary | ICD-10-CM | POA: Diagnosis not present

## 2019-11-27 DIAGNOSIS — J9601 Acute respiratory failure with hypoxia: Secondary | ICD-10-CM | POA: Diagnosis not present

## 2019-11-27 DIAGNOSIS — D631 Anemia in chronic kidney disease: Secondary | ICD-10-CM | POA: Diagnosis present

## 2019-11-27 DIAGNOSIS — I517 Cardiomegaly: Secondary | ICD-10-CM | POA: Diagnosis not present

## 2019-11-27 DIAGNOSIS — R000 Tachycardia, unspecified: Secondary | ICD-10-CM | POA: Diagnosis not present

## 2019-11-27 DIAGNOSIS — J9811 Atelectasis: Secondary | ICD-10-CM | POA: Diagnosis not present

## 2019-11-27 DIAGNOSIS — E1142 Type 2 diabetes mellitus with diabetic polyneuropathy: Secondary | ICD-10-CM | POA: Diagnosis present

## 2019-11-27 DIAGNOSIS — E785 Hyperlipidemia, unspecified: Secondary | ICD-10-CM | POA: Diagnosis present

## 2019-11-27 DIAGNOSIS — R5381 Other malaise: Secondary | ICD-10-CM | POA: Diagnosis not present

## 2019-11-27 DIAGNOSIS — I428 Other cardiomyopathies: Secondary | ICD-10-CM | POA: Diagnosis present

## 2019-11-27 DIAGNOSIS — I248 Other forms of acute ischemic heart disease: Secondary | ICD-10-CM | POA: Diagnosis present

## 2019-11-27 DIAGNOSIS — J9621 Acute and chronic respiratory failure with hypoxia: Secondary | ICD-10-CM | POA: Diagnosis present

## 2019-11-27 DIAGNOSIS — Z515 Encounter for palliative care: Secondary | ICD-10-CM | POA: Diagnosis present

## 2019-11-27 DIAGNOSIS — R0602 Shortness of breath: Secondary | ICD-10-CM | POA: Diagnosis not present

## 2019-11-27 DIAGNOSIS — R0789 Other chest pain: Secondary | ICD-10-CM | POA: Diagnosis not present

## 2019-11-27 DIAGNOSIS — Z7401 Bed confinement status: Secondary | ICD-10-CM

## 2019-11-27 DIAGNOSIS — R109 Unspecified abdominal pain: Secondary | ICD-10-CM

## 2019-11-27 DIAGNOSIS — I5084 End stage heart failure: Secondary | ICD-10-CM | POA: Diagnosis present

## 2019-11-27 DIAGNOSIS — I252 Old myocardial infarction: Secondary | ICD-10-CM | POA: Diagnosis not present

## 2019-11-27 DIAGNOSIS — E1140 Type 2 diabetes mellitus with diabetic neuropathy, unspecified: Secondary | ICD-10-CM | POA: Diagnosis present

## 2019-11-27 DIAGNOSIS — R7989 Other specified abnormal findings of blood chemistry: Secondary | ICD-10-CM | POA: Diagnosis present

## 2019-11-27 DIAGNOSIS — Z20822 Contact with and (suspected) exposure to covid-19: Secondary | ICD-10-CM | POA: Diagnosis present

## 2019-11-27 DIAGNOSIS — E1165 Type 2 diabetes mellitus with hyperglycemia: Secondary | ICD-10-CM | POA: Diagnosis present

## 2019-11-27 DIAGNOSIS — J80 Acute respiratory distress syndrome: Secondary | ICD-10-CM | POA: Diagnosis not present

## 2019-11-27 DIAGNOSIS — Z9119 Patient's noncompliance with other medical treatment and regimen: Secondary | ICD-10-CM

## 2019-11-27 DIAGNOSIS — Z9114 Patient's other noncompliance with medication regimen: Secondary | ICD-10-CM | POA: Diagnosis not present

## 2019-11-27 DIAGNOSIS — Z833 Family history of diabetes mellitus: Secondary | ICD-10-CM

## 2019-11-27 DIAGNOSIS — F329 Major depressive disorder, single episode, unspecified: Secondary | ICD-10-CM | POA: Diagnosis present

## 2019-11-27 DIAGNOSIS — Z7189 Other specified counseling: Secondary | ICD-10-CM | POA: Diagnosis not present

## 2019-11-27 DIAGNOSIS — Z789 Other specified health status: Secondary | ICD-10-CM

## 2019-11-27 DIAGNOSIS — I160 Hypertensive urgency: Secondary | ICD-10-CM | POA: Diagnosis present

## 2019-11-27 DIAGNOSIS — E7800 Pure hypercholesterolemia, unspecified: Secondary | ICD-10-CM | POA: Diagnosis present

## 2019-11-27 DIAGNOSIS — J90 Pleural effusion, not elsewhere classified: Secondary | ICD-10-CM | POA: Diagnosis not present

## 2019-11-27 DIAGNOSIS — Z8673 Personal history of transient ischemic attack (TIA), and cerebral infarction without residual deficits: Secondary | ICD-10-CM

## 2019-11-27 DIAGNOSIS — M2550 Pain in unspecified joint: Secondary | ICD-10-CM | POA: Diagnosis not present

## 2019-11-27 DIAGNOSIS — R627 Adult failure to thrive: Secondary | ICD-10-CM | POA: Diagnosis present

## 2019-11-27 DIAGNOSIS — J811 Chronic pulmonary edema: Secondary | ICD-10-CM | POA: Diagnosis not present

## 2019-11-27 DIAGNOSIS — Z79899 Other long term (current) drug therapy: Secondary | ICD-10-CM

## 2019-11-27 DIAGNOSIS — I10 Essential (primary) hypertension: Secondary | ICD-10-CM | POA: Diagnosis present

## 2019-11-27 DIAGNOSIS — R079 Chest pain, unspecified: Secondary | ICD-10-CM | POA: Diagnosis not present

## 2019-11-27 DIAGNOSIS — Z66 Do not resuscitate: Secondary | ICD-10-CM | POA: Diagnosis present

## 2019-11-27 DIAGNOSIS — Z7982 Long term (current) use of aspirin: Secondary | ICD-10-CM

## 2019-11-27 DIAGNOSIS — Z823 Family history of stroke: Secondary | ICD-10-CM | POA: Diagnosis not present

## 2019-11-27 DIAGNOSIS — R0902 Hypoxemia: Secondary | ICD-10-CM | POA: Diagnosis not present

## 2019-11-27 DIAGNOSIS — R1084 Generalized abdominal pain: Secondary | ICD-10-CM | POA: Diagnosis not present

## 2019-11-27 DIAGNOSIS — R778 Other specified abnormalities of plasma proteins: Secondary | ICD-10-CM | POA: Diagnosis present

## 2019-11-27 LAB — COMPREHENSIVE METABOLIC PANEL
ALT: 19 U/L (ref 0–44)
AST: 15 U/L (ref 15–41)
Albumin: 2.9 g/dL — ABNORMAL LOW (ref 3.5–5.0)
Alkaline Phosphatase: 80 U/L (ref 38–126)
Anion gap: 13 (ref 5–15)
BUN: 38 mg/dL — ABNORMAL HIGH (ref 8–23)
CO2: 21 mmol/L — ABNORMAL LOW (ref 22–32)
Calcium: 9.3 mg/dL (ref 8.9–10.3)
Chloride: 106 mmol/L (ref 98–111)
Creatinine, Ser: 1.91 mg/dL — ABNORMAL HIGH (ref 0.44–1.00)
GFR calc Af Amer: 32 mL/min — ABNORMAL LOW (ref >60)
GFR calc non Af Amer: 27 mL/min — ABNORMAL LOW (ref >60)
Glucose, Bld: 128 mg/dL — ABNORMAL HIGH (ref 70–99)
Potassium: 4.3 mmol/L (ref 3.5–5.1)
Sodium: 140 mmol/L (ref 135–145)
Total Bilirubin: 0.5 mg/dL (ref 0.3–1.2)
Total Protein: 6.4 g/dL — ABNORMAL LOW (ref 6.5–8.1)

## 2019-11-27 LAB — CBC WITH DIFFERENTIAL/PLATELET
Abs Immature Granulocytes: 0.03 10*3/uL (ref 0.00–0.07)
Basophils Absolute: 0 10*3/uL (ref 0.0–0.1)
Basophils Relative: 0 %
Eosinophils Absolute: 0.1 10*3/uL (ref 0.0–0.5)
Eosinophils Relative: 1 %
HCT: 31.8 % — ABNORMAL LOW (ref 36.0–46.0)
Hemoglobin: 9.4 g/dL — ABNORMAL LOW (ref 12.0–15.0)
Immature Granulocytes: 0 %
Lymphocytes Relative: 7 %
Lymphs Abs: 0.5 10*3/uL — ABNORMAL LOW (ref 0.7–4.0)
MCH: 28.4 pg (ref 26.0–34.0)
MCHC: 29.6 g/dL — ABNORMAL LOW (ref 30.0–36.0)
MCV: 96.1 fL (ref 80.0–100.0)
Monocytes Absolute: 0.4 10*3/uL (ref 0.1–1.0)
Monocytes Relative: 5 %
Neutro Abs: 6.6 10*3/uL (ref 1.7–7.7)
Neutrophils Relative %: 87 %
Platelets: 311 10*3/uL (ref 150–400)
RBC: 3.31 MIL/uL — ABNORMAL LOW (ref 3.87–5.11)
RDW: 18.1 % — ABNORMAL HIGH (ref 11.5–15.5)
WBC: 7.6 10*3/uL (ref 4.0–10.5)
nRBC: 0.3 % — ABNORMAL HIGH (ref 0.0–0.2)

## 2019-11-27 LAB — TROPONIN I (HIGH SENSITIVITY)
Troponin I (High Sensitivity): 19 ng/L — ABNORMAL HIGH (ref <18)
Troponin I (High Sensitivity): 27 ng/L — ABNORMAL HIGH (ref <18)

## 2019-11-27 LAB — HEMOGLOBIN A1C
Hgb A1c MFr Bld: 6.1 % — ABNORMAL HIGH (ref 4.8–5.6)
Mean Plasma Glucose: 128.37 mg/dL

## 2019-11-27 LAB — SARS CORONAVIRUS 2 BY RT PCR (HOSPITAL ORDER, PERFORMED IN ~~LOC~~ HOSPITAL LAB): SARS Coronavirus 2: NEGATIVE

## 2019-11-27 LAB — PROTIME-INR
INR: 1.2 (ref 0.8–1.2)
Prothrombin Time: 15 seconds (ref 11.4–15.2)

## 2019-11-27 LAB — BRAIN NATRIURETIC PEPTIDE: B Natriuretic Peptide: 1337.9 pg/mL — ABNORMAL HIGH (ref 0.0–100.0)

## 2019-11-27 LAB — MAGNESIUM: Magnesium: 1.9 mg/dL (ref 1.7–2.4)

## 2019-11-27 MED ORDER — ENOXAPARIN SODIUM 40 MG/0.4ML ~~LOC~~ SOLN: 40.0000 mg | SUBCUTANEOUS | Status: DC

## 2019-11-27 MED ORDER — FUROSEMIDE 10 MG/ML IJ SOLN: 80.0000 mg | Freq: Once | INTRAMUSCULAR | Status: DC

## 2019-11-27 MED ORDER — SODIUM CHLORIDE 0.9% FLUSH: 3.0000 mL | INTRAVENOUS | Status: DC | PRN

## 2019-11-27 MED ORDER — SODIUM CHLORIDE 0.9 % IV SOLN: 250.0000 mL | INTRAVENOUS | Status: DC | PRN

## 2019-11-27 MED ORDER — ONDANSETRON HCL 4 MG/2ML IJ SOLN
4.0000 mg | Freq: Four times a day (QID) | INTRAMUSCULAR | Status: DC | PRN
  Administered 2019-11-30: 4 mg via INTRAVENOUS
  Filled 2019-11-27: qty 2

## 2019-11-27 MED ORDER — FUROSEMIDE 10 MG/ML IJ SOLN
80.0000 mg | Freq: Once | INTRAMUSCULAR | Status: AC
  Administered 2019-11-27: 80 mg via INTRAVENOUS
  Filled 2019-11-27: qty 8

## 2019-11-27 MED ORDER — AMOXICILLIN-POT CLAVULANATE 500-125 MG PO TABS: 1.0000 | ORAL_TABLET | Freq: Two times a day (BID) | ORAL | Status: DC

## 2019-11-27 MED ORDER — GABAPENTIN 100 MG PO CAPS
100.0000 mg | ORAL_CAPSULE | Freq: Two times a day (BID) | ORAL | Status: DC
  Administered 2019-11-27 – 2019-12-02 (×10): 100 mg via ORAL
  Filled 2019-11-27 (×10): qty 1

## 2019-11-27 MED ORDER — HYDRALAZINE HCL 25 MG PO TABS
12.5000 mg | ORAL_TABLET | Freq: Three times a day (TID) | ORAL | Status: DC
  Administered 2019-11-27 – 2019-11-29 (×4): 12.5 mg via ORAL
  Filled 2019-11-27 (×5): qty 1

## 2019-11-27 MED ORDER — METRONIDAZOLE IN NACL 5-0.79 MG/ML-% IV SOLN
500.0000 mg | Freq: Three times a day (TID) | INTRAVENOUS | Status: AC
  Administered 2019-11-27 – 2019-11-29 (×7): 500 mg via INTRAVENOUS
  Filled 2019-11-27 (×7): qty 100

## 2019-11-27 MED ORDER — ACETAMINOPHEN 325 MG PO TABS
650.0000 mg | ORAL_TABLET | ORAL | Status: DC | PRN
  Administered 2019-11-28: 650 mg via ORAL
  Filled 2019-11-27: qty 2

## 2019-11-27 MED ORDER — INSULIN ASPART 100 UNIT/ML ~~LOC~~ SOLN
0.0000 [IU] | Freq: Three times a day (TID) | SUBCUTANEOUS | Status: DC
  Administered 2019-11-29 – 2019-12-01 (×3): 3 [IU] via SUBCUTANEOUS
  Administered 2019-12-02: 2 [IU] via SUBCUTANEOUS

## 2019-11-27 MED ORDER — POLYETHYLENE GLYCOL 3350 17 G PO PACK: 17.0000 g | PACK | Freq: Every day | ORAL | Status: DC | PRN

## 2019-11-27 MED ORDER — DULOXETINE HCL 30 MG PO CPEP
30.0000 mg | ORAL_CAPSULE | Freq: Every day | ORAL | Status: DC
  Administered 2019-11-27 – 2019-12-01 (×5): 30 mg via ORAL
  Filled 2019-11-27 (×6): qty 1

## 2019-11-27 MED ORDER — SODIUM CHLORIDE 0.9 % IV SOLN
2.0000 g | INTRAVENOUS | Status: AC
  Administered 2019-11-27 – 2019-11-29 (×3): 2 g via INTRAVENOUS
  Filled 2019-11-27: qty 2
  Filled 2019-11-27 (×2): qty 20

## 2019-11-27 MED ORDER — PANTOPRAZOLE SODIUM 20 MG PO TBEC
20.0000 mg | DELAYED_RELEASE_TABLET | Freq: Every day | ORAL | Status: DC
  Administered 2019-11-27 – 2019-12-02 (×6): 20 mg via ORAL
  Filled 2019-11-27 (×6): qty 1

## 2019-11-27 MED ORDER — SODIUM CHLORIDE 0.9% FLUSH
3.0000 mL | Freq: Two times a day (BID) | INTRAVENOUS | Status: DC
  Administered 2019-11-27 – 2019-12-02 (×10): 3 mL via INTRAVENOUS

## 2019-11-27 MED ORDER — NITROGLYCERIN 0.4 MG SL SUBL: 0.4000 mg | SUBLINGUAL_TABLET | SUBLINGUAL | Status: DC | PRN

## 2019-11-27 MED ORDER — FUROSEMIDE 10 MG/ML IJ SOLN
80.0000 mg | Freq: Two times a day (BID) | INTRAMUSCULAR | Status: DC
  Administered 2019-11-27 – 2019-12-02 (×10): 80 mg via INTRAVENOUS
  Filled 2019-11-27 (×11): qty 8

## 2019-11-27 MED ORDER — ATORVASTATIN CALCIUM 80 MG PO TABS
80.0000 mg | ORAL_TABLET | Freq: Every day | ORAL | Status: DC
  Administered 2019-11-27 – 2019-12-01 (×5): 80 mg via ORAL
  Filled 2019-11-27 (×5): qty 1

## 2019-11-27 MED ORDER — ALLOPURINOL 100 MG PO TABS
200.0000 mg | ORAL_TABLET | Freq: Every day | ORAL | Status: DC
  Administered 2019-11-27 – 2019-12-01 (×5): 200 mg via ORAL
  Filled 2019-11-27 (×6): qty 2

## 2019-11-27 MED ORDER — INSULIN ASPART 100 UNIT/ML ~~LOC~~ SOLN: 0.0000 [IU] | Freq: Every day | SUBCUTANEOUS | Status: DC

## 2019-11-27 MED ORDER — NITROGLYCERIN 0.4 MG SL SUBL
SUBLINGUAL_TABLET | SUBLINGUAL | Status: AC
  Administered 2019-11-27: 0.4 mg
  Filled 2019-11-27: qty 1

## 2019-11-27 MED ORDER — ASPIRIN EC 81 MG PO TBEC
81.0000 mg | DELAYED_RELEASE_TABLET | Freq: Every morning | ORAL | Status: DC
  Administered 2019-11-28 – 2019-12-02 (×5): 81 mg via ORAL
  Filled 2019-11-27 (×4): qty 1

## 2019-11-27 MED ORDER — HEPARIN SODIUM (PORCINE) 5000 UNIT/ML IJ SOLN
5000.0000 [IU] | Freq: Three times a day (TID) | INTRAMUSCULAR | Status: DC
  Administered 2019-11-27 – 2019-12-02 (×15): 5000 [IU] via SUBCUTANEOUS
  Filled 2019-11-27 (×13): qty 1

## 2019-11-27 MED ORDER — ACETAMINOPHEN 500 MG PO TABS: 500.0000 mg | ORAL_TABLET | Freq: Four times a day (QID) | ORAL | Status: DC | PRN

## 2019-11-27 MED ORDER — POTASSIUM CHLORIDE CRYS ER 20 MEQ PO TBCR
20.0000 meq | EXTENDED_RELEASE_TABLET | Freq: Every day | ORAL | Status: DC
  Administered 2019-11-28 – 2019-12-02 (×5): 20 meq via ORAL
  Filled 2019-11-27 (×5): qty 1

## 2019-11-27 NOTE — H&P (Signed)
History and Physical    Kara Hanson OQH:[id number] DOB: 08-Dec-1955 DOA: 11/27/2019  PCP: [name], MD  Patient coming from: Home  I have personally briefly reviewed patient's old medical records in [place]  Chief Complaint: Shortness of breath  HPI: Kara Hanson is a 64 y.o. female with medical history significant of hypertension, systolic congestive heart failure (last EF 20-25%), insulin-dependent diabetes mellitus, chronic hypoxic respiratory failure on 4 L of nasal cannula oxygen, pneumonia due to COVID-19/2020, chronic kidney disease stage IV presented to hospital with complaint of shortness of breath.  Please note that patient was admitted on 11/19/2019 and discharged on 11/26/2019.  She was hospitalized for acute on chronic hypoxic respiratory failure secondary to acute on chronic combined systolic and diastolic heart failure as well as acute diverticulitis.  According to patient, she was feeling fine when she was discharged from hospital but upon arrival to home, she started feeling shortness of breath.  She tells me that she waited all day long yesterday and overnight and then decided to come to the ED today.  I then called patient's daughter [name] over the phone and she told me that patient was doing fine up until late evening yesterday when she started having more shortness of breath and she continued to have worsening shortness of breath overnight and since it got worse this morning so they decided to call EMS.  Reportedly, when EMS arrived, she was found to be saturating 70% while on 4 L nasal, oxygen which is her baseline.  She was brought into the emergency department and was on nonrebreather.  ED Course: Upon arrival to ED, she was on nonrebreather, tachypneic and tachycardic.  Blood pressure was stable.  Chest x-ray showed pulmonary edema.  80 mg of IV Lasix has been ordered however she has not received it yet.  Hospital service were called to admit the patient  for further management.  COVID-19 test is still pending.  Upon my evaluation, patient still is on nonrebreather.  She was able to talk in 1-2 words.  She is alert and awake.  Still complains of shortness of breath.  Review of Systems: As per HPI otherwise negative.    Past Medical History:  Diagnosis Date  . Acute CHF (congestive heart failure) ([place]) 09/24/2018  . Acute on chronic combined systolic and diastolic CHF (congestive heart failure) ([place]) 11/27/2017  . Acute on chronic respiratory failure with hypoxia ([place]) 03/20/2018  . Acute renal failure superimposed on stage 3 chronic kidney disease ([place])   . Acute respiratory distress 11/27/2017  . Acute respiratory failure with hypoxia ([place]) 02/09/2019  . Acute systolic CHF (congestive heart failure) ([place]) 05/19/2019  . AKI (acute kidney injury) ([place]) 11/27/2017  . Atelectasis   . Bulging lumbar disc 11/14/2017  . CAP (community acquired pneumonia) 10/31/2018  . Chest pain 06/17/2016  . CHF (congestive heart failure) ([place])   . Chronic diastolic CHF (congestive heart failure) ([place]) 07/20/2018  . Chronic respiratory failure ([place])   . CKD (chronic kidney disease), stage III 03/20/2018  . Controlled type 2 diabetes mellitus with hyperglycemia ([place]) 04/23/2016  . Controlled type 2 diabetes with neuropathy ([place]) 03/20/2018  . COVID-19 virus infection 07/20/2018  . CVA (cerebral vascular accident) ([place])   . Diabetes mellitus without complication ([place])   . Dyspnea   . Elevated troponin 04/18/2019  . Epigastric pain   . Gout 11/14/2017  . Hypercholesteremia   . Hyperlipidemia LDL goal <70 09/30/2016  . Hypertension   . Myocardial infarction (  HCC)   . Normochromic normocytic anemia 04/23/2016  . NSTEMI (non-ST elevated myocardial infarction) ([place]) 02/09/2019  . Pneumonia 11/01/2018  . Pneumonia due to COVID-19 virus 07/20/2018  . S/P thoracentesis   . SIRS (systemic inflammatory response syndrome) ([place]) 07/25/2018  . Spinal stenosis   . Stress-induced  cardiomyopathy 05/19/2016  . Syncope 04/23/2016  . Type 2 diabetes mellitus with diabetic neuropathy, unspecified ([place]) 05/06/2016  . Vertigo 05/06/2016  . Vitamin D deficiency 04/02/2018    Past Surgical History:  Procedure Laterality Date  . BIOPSY  01/27/2019   Procedure: BIOPSY;  Surgeon: Otis Brace, MD;  Location: [facility];  Service: Gastroenterology;;  . BLADDER SURGERY    . CARDIAC CATHETERIZATION N/A 04/25/2016   Procedure: Left Heart Cath and Coronary Angiography;  Surgeon: Lorretta Harp, MD;  Location: [facility];  Service: Cardiovascular;  Laterality: N/A;  . CARDIAC CATHETERIZATION  02/11/2019  . CESAREAN SECTION    . CHOLECYSTECTOMY    . ESOPHAGOGASTRODUODENOSCOPY (EGD) WITH PROPOFOL N/A 01/27/2019   Procedure: ESOPHAGOGASTRODUODENOSCOPY (EGD) WITH PROPOFOL;  Surgeon: Otis Brace, MD;  Location: [facility];  Service: Gastroenterology;  Laterality: N/A;  . RIGHT HEART CATH N/A 09/30/2019   Procedure: RIGHT HEART CATH;  Surgeon: Jolaine Artist, MD;  Location: [facility];  Service: Cardiovascular;  Laterality: N/A;  . RIGHT/LEFT HEART CATH AND CORONARY ANGIOGRAPHY N/A 02/11/2019   Procedure: RIGHT/LEFT HEART CATH AND CORONARY ANGIOGRAPHY;  Surgeon: Jolaine Artist, MD;  Location: [facility];  Service: Cardiovascular;  Laterality: N/A;  . VIDEO BRONCHOSCOPY N/A 02/01/2019   Procedure: VIDEO BRONCHOSCOPY WITHOUT FLUORO;  Surgeon: Candee Furbish, MD;  Location: [facility];  Service: Endoscopy;  Laterality: N/A;     reports that she has never smoked. She has never used smokeless tobacco. She reports that she does not drink alcohol and does not use drugs.  Allergies  Allergen Reactions  . Garlic Shortness Of Breath, Itching, Swelling and Other (See Comments)    "Raw garlic" = Hand itching and swelling  . Isosorbide Other (See Comments)    Sneezing and runny nose  . Latex Itching  . Morphine And Related Itching and Other (See  Comments)    Headache   . Other Itching and Other (See Comments)    Reaction to newspaper ink- Headaches, also    Family History  Problem Relation Age of Onset  . Diabetes Mellitus II Father   . Stroke Father   . Healthy Mother        She is 80 years old.     Prior to Admission medications   Medication Sig Start Date End Date Taking? Authorizing Provider  Accu-Chek FastClix Lancets MISC USE AS DIRECTED TO TEST BLOOD SUGAR THREE TIMES DAILY Patient not taking: Reported on 11/27/2019 06/10/19   [name], MD  acetaminophen (TYLENOL) 500 MG tablet Take 500-1,000 mg by mouth every 6 (six) hours as needed for mild pain or headache.    [provider]  allopurinol (ZYLOPRIM) 100 MG tablet Take 2 tablets (200 mg total) by mouth at bedtime. 10/06/19   Georgette Shell, MD  amoxicillin-clavulanate (AUGMENTIN) 500-125 MG tablet Take 1 tablet (500 mg total) by mouth in the morning and at bedtime for 4 days. 11/26/19 11/30/19  Cristal Ford, DO  aspirin EC 81 MG tablet Take 81 mg by mouth in the morning.     [provider]  atorvastatin (LIPITOR) 80 MG tablet Take 80 mg by mouth at bedtime.  [provider]  Blood Glucose Monitoring Suppl (ACCU-CHEK GUIDE) w/Device KIT 1 each by Does not apply route 3 (three) times daily. Patient not taking: Reported on 11/06/2019 06/10/19   [name], MD  Continuous Blood Gluc Receiver (DEXCOM G6 RECEIVER) DEVI Use 1 each as directed Patient not taking: Reported on 11/27/2019 11/22/19   [name], MD  Continuous Blood Gluc Sensor (DEXCOM G6 SENSOR) MISC Use 1 each as directed Patient not taking: Reported on 11/27/2019 11/22/19   [name], MD  Continuous Blood Gluc Transmit (DEXCOM G6 TRANSMITTER) MISC Use 1 each as directed Patient not taking: Reported on 11/27/2019 11/22/19   [name], MD  DULoxetine (CYMBALTA) 30 MG capsule Take 1 capsule (30 mg total) by mouth daily. Patient taking differently: Take 30 mg  by mouth at bedtime.  02/06/19   Bonnell Public, MD  EASY COMFORT PEN NEEDLES 31G X 5 MM MISC USE FOUR TIMES PER DAY FOR INSULIN ADMINISTRATION Patient not taking: Reported on 11/27/2019 10/02/18   [name], MD  fluticasone (FLONASE) 50 MCG/ACT nasal spray Place 2 sprays into both nostrils daily.    [provider]  gabapentin (NEURONTIN) 100 MG capsule TAKE 1 CAPSULE (100 MG TOTAL) BY MOUTH 2 (TWO) TIMES DAILY. Patient taking differently: Take 100 mg by mouth 2 (two) times daily.  10/17/19   [name], MD  glucose blood (ACCU-CHEK GUIDE) test strip USE AS DIRECTED TO TEST BLOOD SUGAR THREE TIMES DAILY Patient not taking: Reported on 11/27/2019 06/10/19   [name], MD  hydrALAZINE (APRESOLINE) 25 MG tablet Take 0.5 tablets (12.5 mg total) by mouth 3 (three) times daily. 10/06/19 10/05/20  Georgette Shell, MD  insulin aspart (NOVOLOG) 100 UNIT/ML injection 0 to 12 units subcutaneously 3 times daily before meals as per sliding scale Patient not taking: Reported on 11/27/2019 10/24/19   [name], MD  Insulin Glargine (BASAGLAR KWIKPEN) 100 UNIT/ML Inject 0.05 mLs (5 Units total) into the skin daily. Patient not taking: Reported on 11/27/2019 10/24/19   [name], MD  Lancet Device MISC Use as instructed 3 times daily Patient not taking: Reported on 11/27/2019 04/26/19   [name], MD  lansoprazole (PREVACID) 15 MG capsule TAKE 1 CAPSULE (15 MG TOTAL) BY MOUTH DAILY. Patient taking differently: Take 15 mg by mouth daily before breakfast.  09/05/19   [name], MD  Misc. Devices MISC Portable oxygen concentrator.  Diagnosis-chronic respiratory failure. 09/03/18   [name], MD  Misc. Devices MISC [place] with seat. Dx: Congestive Heart Failure 10/19/18   [name], MD  Misc. Devices MISC Scale; Dx - CHF 06/10/19   [name], MD  Misc. [place] [facility] bed.  Diagnosis CHF.  Lifetime use.  Weight 165 lbs. 07/10/19   [name], MD  Misc.  Devices [place] chair DX CHF 10/10/19   [name], MD  Misc. Devices MISC Manual wheelchai with cushion, anti - tippers, brakes and lock.  Diagnosis congestive heart failure.  Weight 140 pounds. 10/14/19   [name], MD  Multiple Vitamins-Minerals (MULTIVITAMIN WITH MINERALS) tablet Take 1 tablet by mouth daily with breakfast.     [provider]  OXYGEN Inhale 4 L/min into the lungs continuous.    [provider]  polyethylene glycol (MIRALAX / GLYCOLAX) 17 g packet Take 17 g by mouth daily as needed for moderate constipation. Patient not taking: Reported on 10/08/2019 05/22/19   Geradine Girt, DO  potassium chloride SA (KLOR-CON) 20 MEQ tablet Take 1 tablet (  20 mEq total) by mouth daily for 7 days. 11/26/19 12/03/19  Cristal Ford, DO  primidone (MYSOLINE) 50 MG tablet Take 1 tablet (50 mg total) by mouth in the morning and at bedtime. 08/19/19   Tat, Eustace Quail, DO  SUPER B COMPLEX/C CAPS Take 1 capsule by mouth daily.     [provider]  torsemide (DEMADEX) 20 MG tablet Take 2 tablets (40 mg total) by mouth 2 (two) times daily. 10/08/19 01/06/20  Bensimhon, Shaune Pascal, MD  Vitamin D, Ergocalciferol, (DRISDOL) 1.25 MG (50000 UNIT) CAPS capsule Take 50,000 Units by mouth every Sunday. Patient not taking: Reported on 11/27/2019    [provider]    Physical Exam: Vitals:   11/27/19 1431 11/27/19 1500 11/27/19 1600  BP: (!) 154/84    Pulse: (!) 129 (!) 130 (!) 138  Resp: (!) 22 (!) 22 (!) 27  Temp: 98.4 F (36.9 C)    TempSrc: Oral    SpO2: 100% 100% 99%    Constitutional: NAD, calm, comfortable Vitals:   11/27/19 1431 11/27/19 1500 11/27/19 1600  BP: (!) 154/84    Pulse: (!) 129 (!) 130 (!) 138  Resp: (!) 22 (!) 22 (!) 27  Temp: 98.4 F (36.9 C)    TempSrc: Oral    SpO2: 100% 100% 99%   Eyes: PERRL, lids and conjunctivae normal ENMT: Mucous membranes are moist. Posterior pharynx clear of any exudate or lesions.Normal dentition.  Neck:  normal, supple, no masses, no thyromegaly Respiratory: Severely diminished breath sounds.  No rhonchi or crackles. Cardiovascular: Regular rate and rhythm but tachycardic, no murmurs / rubs / gallops. No extremity edema. 2+ pedal pulses. No carotid bruits.  Abdomen: no tenderness, no masses palpated. No hepatosplenomegaly. Bowel sounds positive.  Musculoskeletal: no clubbing / cyanosis. No joint deformity upper and lower extremities. Good ROM, no contractures. Normal muscle tone.  Skin: no rashes, lesions, ulcers. No induration Neurologic: CN 2-12 grossly intact. Sensation intact, DTR normal. Strength 5/5 in all 4.    Labs on Admission: I have personally reviewed following labs and imaging studies  CBC: Recent Labs  Lab 11/21/19 0439 11/21/19 1929 11/22/19 0430 11/23/19 0201 11/24/19 0506 11/26/19 0851 11/27/19 1440  WBC 5.3  --  4.6  --   --   --  7.6  NEUTROABS  --   --   --   --   --   --  6.6  HGB 7.5*   < > 7.8* 8.1* 8.1* 7.8* 9.4*  HCT 25.2*   < > 25.7* 26.7* 26.9* 26.4* 31.8*  MCV 93.7  --  92.8  --   --   --  96.1  PLT 287  --  284  --   --   --  311   < > = values in this interval not displayed.   Basic Metabolic Panel: Recent Labs  Lab 11/23/19 0201 11/24/19 0506 11/25/19 0609 11/26/19 0851 11/27/19 1440  NA 139 138 138 140 140  K 3.5 3.5 3.5 3.4* 4.3  CL 105 105 103 107 106  CO2 24 22 22 24  21*  GLUCOSE 85 140* 81 92 128*  BUN 51* 46* 42* 38* 38*  CREATININE 1.93* 1.80* 1.72* 1.83* 1.91*  CALCIUM 9.4 8.9 9.1 9.1 9.3   GFR: Estimated Creatinine Clearance: 24.9 mL/min (A) (by C-G formula based on SCr of 1.91 mg/dL (H)). Liver Function Tests: Recent Labs  Lab 11/27/19 1440  AST 15  ALT 19  ALKPHOS 80  BILITOT 0.5  PROT 6.4*  ALBUMIN 2.9*   No results for input(s): LIPASE, AMYLASE in the last 168 hours. No results for input(s): AMMONIA in the last 168 hours. Coagulation Profile: Recent Labs  Lab 11/27/19 1440  INR 1.2   Cardiac Enzymes: No  results for input(s): CKTOTAL, CKMB, CKMBINDEX, TROPONINI in the last 168 hours. BNP (last 3 results) No results for input(s): PROBNP in the last 8760 hours. HbA1C: No results for input(s): HGBA1C in the last 72 hours. CBG: Recent Labs  Lab 11/25/19 0744 11/25/19 1206 11/25/19 1646 11/25/19 2051 11/26/19 0856  GLUCAP 116* 133* 100* 175* 92   Lipid Profile: No results for input(s): CHOL, HDL, LDLCALC, TRIG, CHOLHDL, LDLDIRECT in the last 72 hours. Thyroid Function Tests: No results for input(s): TSH, T4TOTAL, FREET4, T3FREE, THYROIDAB in the last 72 hours. Anemia Panel: No results for input(s): VITAMINB12, FOLATE, FERRITIN, TIBC, IRON, RETICCTPCT in the last 72 hours. Urine analysis:    Component Value Date/Time   COLORURINE AMBER (A) 09/26/2019 1645   APPEARANCEUR CLOUDY (A) 09/26/2019 1645   LABSPEC 1.014 09/26/2019 1645   PHURINE 5.0 09/26/2019 1645   GLUCOSEU 50 (A) 09/26/2019 1645   HGBUR SMALL (A) 09/26/2019 1645   BILIRUBINUR NEGATIVE 09/26/2019 1645   BILIRUBINUR negative 12/13/2017 1514   KETONESUR NEGATIVE 09/26/2019 1645   PROTEINUR >=300 (A) 09/26/2019 1645   UROBILINOGEN 0.2 12/13/2017 1514   NITRITE NEGATIVE 09/26/2019 1645   LEUKOCYTESUR SMALL (A) 09/26/2019 1645    Radiological Exams on Admission: DG Chest Port 1 View  Result Date: 11/27/2019 CLINICAL DATA:  Shortness of breath EXAM: PORTABLE CHEST 1 VIEW COMPARISON:  11/19/2019 FINDINGS: Persistent interstitial prominence with patchy increased density and small bilateral pleural effusions with bibasilar atelectasis. Similar cardiomegaly. No pneumothorax. IMPRESSION: Similar appearance with probable mild pulmonary edema and small bilateral pleural effusions with bibasilar atelectasis. Electronically Signed   By: Macy Mis M.D.   On: 11/27/2019 15:04    EKG done but is not electronically available for me to review.  I could not locate EKG in the ED as well.  Assessment/Plan Active Problems:   Acute  on chronic combined systolic and diastolic CHF (congestive heart failure) (HCC)   Acute on chronic respiratory failure with hypoxia (HCC)    Acuteon chronicrespiratory failure with hypoxia secondary to Acute on chronic combined systolic and diastolicCHF exacerbation: Patient usually uses 4 L of nasal oxygen.  Currently she is on nonrebreather.  X-ray shows pulmonary edema.  BNP also elevated.  80 mg IV Lasix has been ordered which has not been given to her yet.  I will continue her on 80 mg IV twice daily, this is what she was getting here while during recent hospitalization and cardiology was on board.  Status post nose, low-sodium diet.  Try to wean oxygen. -Echocardiogram 09/27/2019 showed an EF of 93-71%, grade 3 diastolic dysfunction -Patient recently had undergone right heart catheterization showing elevated biventricular filling pressures  Acute diverticulitis: She was diagnosed with acute diverticulitis during recent hospitalization.  She was discharged on oral Augmentin to complete 10 days course.  She was getting Rocephin and Flagyl here.  I will prescribe Rocephin and Flagyl 1 more time.  Diabetes mellitus type 2, uncontrolled with hyperglycemia and peripheral neuropathy: Takes only 5 units of Lantus as well as SSI at home. -Last hemoglobin A1c 7.1 on 09/26/2019  Start on SSI here.  Essential hypertension -Continue hydralazine, Lasix -Coreg held by cardiology during recent hospitalization.  Chronic kidney disease stage IV: At baseline.  Hemoglobin  better than her baseline.  Likely hemoconcentration.  CBC in the morning.   Hyperlipidemia -Continue statin  DVT prophylaxis: Heparin Code Status: Full code Family Communication: None present at bedside.  Discussed with daughter over the phone Disposition Plan: Home in next 2 days Consults called: None Admission status: Inpatient   Status is: Inpatient  Remains inpatient appropriate because:Inpatient level of care  appropriate due to severity of illness   Dispo: The patient is from: Home              Anticipated d/c is to: Home              Anticipated d/c date is: 2 days              Patient currently is not medically stable to d/c.       Darliss Cheney MD [facility]  11/27/2019, 5:10 PM  To contact the attending provider between 7A-7P or the covering provider during after hours 7P-7A, please log into the web site [email or web address]

## 2019-11-27 NOTE — Progress Notes (Signed)
PROGRESS NOTE    Kara Hanson  YCX:[id number] DOB: 30-Nov-1955 DOA: 11/27/2019 PCP: [name], MD  No chief complaint on file.   Brief Narrative:   CC:  Received call from RN concern for worsening SOB.  Kara Hanson is a very pleasant 64 year old female with history of CKD 4, chronic combined systolic and diastolic CHF, and chronic hypoxic respiratory failure, admitted today with acute on chronic hypoxic respiratory failure suspected secondary to acute on chronic CHF.   She was discharged from the hospital yesterday after admission for acute on chronic combined CHF, reports that she continued to feel short of breath upon returning home what had improved significantly during the hospitalization.  Unfortunately, she began to have worsening dyspnea yesterday that progressed overnight.  EMS found her to be saturating 70% on her usual 4 L/min of supplemental oxygen and brought her into the ED today.  In ED, she was afebrile, in sinus tachycardia, hypertensive, RR in 30s, and sat 90s on NRB.   EKG with sinus tachy, CXR findings suggest acute CHF, labs with stable CKD IV, improved anemia, BNP higher than priors at 1338, and troponin slightly elevated at 19.   She was admitted to hospitalist service, given 80 mg IV Lasix, and treated with Rocephin and azithromycin.   At change of shift, oncoming RN noted labored breathing. Patient has not yet had any UOP after receiving Lasix 2 hours ago.  Assessment & Plan:   1. Acute on chronic combined systolic & diastolic CHF; acute on chronic hypoxic respiratory failure  - Patient with EF 202-25% and grade III DD presents with worsening SOB, found to be hypoxic on her usual 4 Lpm, had BNP higher than priors at 1338 and CXR consistent with CHF, was given 80 mg IV Lasix but has not had any UOP  - Plan to give another dose Lasix now, start BiPAP -   2. Hypertensive urgency  - BP 170/100  - Anticipate improvement with diuresis, repeating Lasix  now  - She is being given SL NTG now, will reassess after, continue hydralazine, avoid beta-blocker while decompensated   3. Chest discomfort  - Patient reports mild central chest discomfort, had EKG with sinus tachy no ST elevations, CXR findings consistent with acute CHF, and HS troponin 19 then 27  - Likely secondary to acute CHF, will continue cardiac monitoring, give NTG as above, check another troponin, continue ASA and statin    Subjective:  Mild central chest discomfort, shortness of breath, no fevers or chills, no cough.  Objective: Vitals:   11/27/19 1845 11/27/19 1900 11/27/19 1939 11/27/19 1945  BP: (!) 161/99 (!) 157/133 (!) 168/102   Pulse: (!) 136 (!) 142 (!) 133 (!) 133  Resp: (!) 29 (!) 26 (!) 30 (!) 35  Temp:      TempSrc:      SpO2: 96% 100% 100% 100%   No intake or output data in the 24 hours ending 11/27/19 1956 There were no vitals filed for this visit.  Examination:  General exam: Labored respirations, no diaphoresis, calm   Respiratory system: Clear to auscultation. Respiratory effort normal. Cardiovascular system: Rate ~120 and regular. Neck veins distended, bilateral pedal edema. Gastrointestinal system: Abdomen is nondistended, soft and nontender. Normal bowel sounds heard. Central nervous system: Alert and oriented. No focal neurological deficits. Extremities: no red, hot, swollen joints. Skin: No rashes, lesions or ulcers Psychiatry: Judgement and insight appear normal. Mood & affect appropriate.    Data Reviewed: I have personally reviewed  following labs and imaging studies  CBC: Recent Labs  Lab 11/21/19 0439 11/21/19 1929 11/22/19 0430 11/23/19 0201 11/24/19 0506 11/26/19 0851 11/27/19 1440  WBC 5.3  --  4.6  --   --   --  7.6  NEUTROABS  --   --   --   --   --   --  6.6  HGB 7.5*   < > 7.8* 8.1* 8.1* 7.8* 9.4*  HCT 25.2*   < > 25.7* 26.7* 26.9* 26.4* 31.8*  MCV 93.7  --  92.8  --   --   --  96.1  PLT 287  --  284  --   --   --  311    < > = values in this interval not displayed.    Basic Metabolic Panel: Recent Labs  Lab 11/23/19 0201 11/24/19 0506 11/25/19 0609 11/26/19 0851 11/27/19 1440 11/27/19 1804  NA 139 138 138 140 140  --   K 3.5 3.5 3.5 3.4* 4.3  --   CL 105 105 103 107 106  --   CO2 24 22 22 24  21*  --   GLUCOSE 85 140* 81 92 128*  --   BUN 51* 46* 42* 38* 38*  --   CREATININE 1.93* 1.80* 1.72* 1.83* 1.91*  --   CALCIUM 9.4 8.9 9.1 9.1 9.3  --   MG  --   --   --   --   --  1.9    GFR: Estimated Creatinine Clearance: 24.9 mL/min (A) (by C-G formula based on SCr of 1.91 mg/dL (H)).  Liver Function Tests: Recent Labs  Lab 11/27/19 1440  AST 15  ALT 19  ALKPHOS 80  BILITOT 0.5  PROT 6.4*  ALBUMIN 2.9*    CBG: Recent Labs  Lab 11/25/19 0744 11/25/19 1206 11/25/19 1646 11/25/19 2051 11/26/19 0856  GLUCAP 116* 133* 100* 175* 92     Recent Results (from the past 240 hour(s))  SARS Coronavirus 2 by RT PCR (hospital order, performed in [facility] hospital lab) Nasopharyngeal Nasopharyngeal Swab     Status: None   Collection Time: 11/19/19  4:39 AM   Specimen: Nasopharyngeal Swab  Result Value Ref Range Status   SARS Coronavirus 2 NEGATIVE NEGATIVE Final    Comment: (NOTE) SARS-CoV-2 target nucleic acids are NOT DETECTED.  The SARS-CoV-2 RNA is generally detectable in upper and lower respiratory specimens during the acute phase of infection. The lowest concentration of SARS-CoV-2 viral copies this assay can detect is 250 copies / mL. A negative result does not preclude SARS-CoV-2 infection and should not be used as the sole basis for treatment or other patient management decisions.  A negative result may occur with improper specimen collection / handling, submission of specimen other than nasopharyngeal swab, presence of viral mutation(s) within the areas targeted by this assay, and inadequate number of viral copies (<250 copies / mL). A negative result must be combined with  clinical observations, patient history, and epidemiological information.  Fact Sheet for Patients:   StrictlyIdeas.no  Fact Sheet for Healthcare Providers: BankingDealers.co.za  This test is not yet approved or  cleared by the [place] FDA and has been authorized for detection and/or diagnosis of SARS-CoV-2 by FDA under an Emergency Use Authorization (EUA).  This EUA will remain in effect (meaning this test can be used) for the duration of the COVID-19 declaration under Section 564(b)(1) of the Act, 21 U.S.C. section 360bbb-3(b)(1), unless the authorization is terminated or revoked sooner.  Performed at [facility], [place] [address] [place], [place] [address]   C Difficile Quick Screen w PCR reflex     Status: Abnormal   Collection Time: 11/21/19  1:36 PM   Specimen: STOOL  Result Value Ref Range Status   C Diff antigen POSITIVE (A) NEGATIVE Final   C Diff toxin NEGATIVE NEGATIVE Final   C Diff interpretation Results are indeterminate. See PCR results.  Final    Comment: Performed at [facility], [place] [address] [place], [place] [address]  C. Diff by PCR, Reflexed     Status: None   Collection Time: 11/21/19  1:36 PM  Result Value Ref Range Status   Toxigenic C. Difficile by PCR NEGATIVE NEGATIVE Final    Comment: Patient is colonized with non toxigenic C. difficile. May not need treatment unless significant symptoms are present. Performed at [facility], [place] [address], [place], [place] [address]          Radiology Studies: DG Chest Port 1 View  Result Date: 11/27/2019 CLINICAL DATA:  Shortness of breath EXAM: PORTABLE CHEST 1 VIEW COMPARISON:  11/19/2019 FINDINGS: Persistent interstitial prominence with patchy increased density and small bilateral pleural effusions with bibasilar atelectasis. Similar cardiomegaly. No pneumothorax. IMPRESSION: Similar appearance with probable mild pulmonary edema and  small bilateral pleural effusions with bibasilar atelectasis. Electronically Signed   By: Macy Mis M.D.   On: 11/27/2019 15:04        Scheduled Meds: . allopurinol  200 mg Oral QHS  . [START ON 11/28/2019] aspirin EC  81 mg Oral q AM  . atorvastatin  80 mg Oral QHS  . DULoxetine  30 mg Oral QHS  . furosemide  80 mg Intravenous BID  . furosemide  80 mg Intravenous Once  . gabapentin  100 mg Oral BID  . heparin injection (subcutaneous)  5,000 Units Subcutaneous Q8H  . hydrALAZINE  12.5 mg Oral TID  . [START ON 11/28/2019] insulin aspart  0-15 Units Subcutaneous TID WC  . insulin aspart  0-5 Units Subcutaneous QHS  . pantoprazole  20 mg Oral Daily  . potassium chloride SA  20 mEq Oral Daily  . sodium chloride flush  3 mL Intravenous Q12H   Continuous Infusions: . sodium chloride    . cefTRIAXone (ROCEPHIN)  IV Stopped (11/27/19 1935)  . metronidazole 500 mg (11/27/19 1945)     LOS: 0 days   Time spent: 49 minutes.    Critical care was necessary to treat or prevent imminent or life-threatening deterioration.  Critical care was time spent personally by me on the following activities: development of treatment plan with patient and/or surrogate as well as nursing, discussions with consultants, evaluation of patient's response to treatment, examination of patient, obtaining history from patient or surrogate, ordering and performing treatments and interventions, ordering and review of laboratory studies, ordering and review of radiographic studies, pulse oximetry, re-evaluation of patient's condition and participation in multidisciplinary rounds.  Vianne Bulls, MD [facility]   To contact the attending provider between 7A-7P or the covering provider during after hours 7P-7A, please log into the web site [email or web address] and access using universal Hanson password for that web site. If you do not have the password, please call the hospital operator.  11/27/2019, 7:56  PM

## 2019-11-27 NOTE — ED Notes (Addendum)
Pt experienced increased work of breathing c/o acute onset of CP. RR high 30s on the edge of the bed tripoding. Placed on NR Spoke to Dr. Myna Hidalgo informed pt did receive lasix without any output at this time. Second order for 80 Lasix given and pt placed on BiPap by RT.

## 2019-11-27 NOTE — ED Provider Notes (Addendum)
[facility] EMERGENCY DEPARTMENT Provider Note   CSN: [id number] Arrival date & time: 11/27/19  1423     History No chief complaint on file.   Kara Hanson is a 64 y.o. female.  The history is provided by the patient, medical records and the EMS personnel. No language interpreter was used.   Kara Hanson is a 64 y.o. female who presents to the Emergency Department complaining of shortness of breath. She presents the emergency department by EMS for evaluation of shortness of breath that began yesterday. She has a history of CHF, CKD, chronic respiratory failure on 4 L nasal cannula at baseline. She was discharged yesterday the hospital following admission for respiratory failure. She states that she became abruptly short of breath yesterday after taking her home medications. She did have transient cough, now resolved. She has mild chest discomfort. She denies any fevers, vomiting abdominal pain. EMS reports that she was having 70% on her home oxygen settings on their assessment. She was placed on on rebreather with improvement in her sats. Symptoms are severe and constant nature.    Past Medical History:  Diagnosis Date  . Acute CHF (congestive heart failure) ([place]) 09/24/2018  . Acute on chronic combined systolic and diastolic CHF (congestive heart failure) ([place]) 11/27/2017  . Acute on chronic respiratory failure with hypoxia ([place]) 03/20/2018  . Acute renal failure superimposed on stage 3 chronic kidney disease ([place])   . Acute respiratory distress 11/27/2017  . Acute respiratory failure with hypoxia ([place]) 02/09/2019  . Acute systolic CHF (congestive heart failure) ([place]) 05/19/2019  . AKI (acute kidney injury) ([place]) 11/27/2017  . Atelectasis   . Bulging lumbar disc 11/14/2017  . CAP (community acquired pneumonia) 10/31/2018  . Chest pain 06/17/2016  . CHF (congestive heart failure) ([place])   . Chronic diastolic CHF (congestive heart failure) ([place]) 07/20/2018  . Chronic  respiratory failure ([place])   . CKD (chronic kidney disease), stage III 03/20/2018  . Controlled type 2 diabetes mellitus with hyperglycemia ([place]) 04/23/2016  . Controlled type 2 diabetes with neuropathy ([place]) 03/20/2018  . COVID-19 virus infection 07/20/2018  . CVA (cerebral vascular accident) ([place])   . Diabetes mellitus without complication ([place])   . Dyspnea   . Elevated troponin 04/18/2019  . Epigastric pain   . Gout 11/14/2017  . Hypercholesteremia   . Hyperlipidemia LDL goal <70 09/30/2016  . Hypertension   . Myocardial infarction ([place])   . Normochromic normocytic anemia 04/23/2016  . NSTEMI (non-ST elevated myocardial infarction) ([place]) 02/09/2019  . Pneumonia 11/01/2018  . Pneumonia due to COVID-19 virus 07/20/2018  . S/P thoracentesis   . SIRS (systemic inflammatory response syndrome) ([place]) 07/25/2018  . Spinal stenosis   . Stress-induced cardiomyopathy 05/19/2016  . Syncope 04/23/2016  . Type 2 diabetes mellitus with diabetic neuropathy, unspecified ([place]) 05/06/2016  . Vertigo 05/06/2016  . Vitamin D deficiency 04/02/2018    Patient Active Problem List   Diagnosis Date Noted  . Abdominal pain 11/19/2019  . CKD (chronic kidney disease), stage IV ([place]) 11/19/2019  . Palliative care by specialist   . Goals of care, counseling/discussion   . Cardiorenal syndrome   . Hyponatremia 09/26/2019  . CHF exacerbation ([place]) 09/26/2019  . Acute on chronic systolic CHF (congestive heart failure) ([place]) 05/19/2019  . Elevated troponin 04/18/2019  . Acute respiratory failure with hypoxia ([place]) 02/09/2019  . NSTEMI (non-ST elevated myocardial infarction) ([place]) 02/09/2019  . S/P thoracentesis   . Atelectasis   . Chronic respiratory failure ([place])   .  Acute renal failure superimposed on stage 3 chronic kidney disease ([place])   . Epigastric pain   . CHF (congestive heart failure) ([place]) 01/23/2019  . CAP (community acquired pneumonia) 10/31/2018  . Acute CHF (congestive heart failure) ([employer] [facility]) 09/24/2018    . SIRS (systemic inflammatory response syndrome) ([place]) 07/25/2018  . COVID-19 virus infection 07/20/2018  . Chronic diastolic CHF (congestive heart failure) ([place]) 07/20/2018  . Pneumonia due to COVID-19 virus 07/20/2018  . Vitamin D deficiency 04/02/2018  . Spinal stenosis 03/29/2018  . Acute on chronic respiratory failure with hypoxia ([place]) 03/20/2018  . Controlled type 2 diabetes with neuropathy ([place]) 03/20/2018  . CKD (chronic kidney disease), stage III 03/20/2018  . Acute on chronic combined systolic and diastolic CHF (congestive heart failure) ([place]) 11/27/2017  . AKI (acute kidney injury) ([place]) 11/27/2017  . Acute respiratory distress 11/27/2017  . Bulging lumbar disc 11/14/2017  . Gout 11/14/2017  . Hyperlipidemia LDL goal <70 09/30/2016  . Chest pain 06/17/2016  . Stress-induced cardiomyopathy 05/19/2016  . Type 2 diabetes mellitus with diabetic neuropathy, unspecified ([place]) 05/06/2016  . Vertigo 05/06/2016  . Controlled type 2 diabetes mellitus with hyperglycemia ([place]) 04/23/2016  . Hypertension 04/23/2016  . Normochromic normocytic anemia 04/23/2016  . Syncope 04/23/2016    Past Surgical History:  Procedure Laterality Date  . BIOPSY  01/27/2019   Procedure: BIOPSY;  Surgeon: Otis Brace, MD;  Location: [facility];  Service: Gastroenterology;;  . BLADDER SURGERY    . CARDIAC CATHETERIZATION N/A 04/25/2016   Procedure: Left Heart Cath and Coronary Angiography;  Surgeon: Lorretta Harp, MD;  Location: [facility];  Service: Cardiovascular;  Laterality: N/A;  . CARDIAC CATHETERIZATION  02/11/2019  . CESAREAN SECTION    . CHOLECYSTECTOMY    . ESOPHAGOGASTRODUODENOSCOPY (EGD) WITH PROPOFOL N/A 01/27/2019   Procedure: ESOPHAGOGASTRODUODENOSCOPY (EGD) WITH PROPOFOL;  Surgeon: Otis Brace, MD;  Location: [facility];  Service: Gastroenterology;  Laterality: N/A;  . RIGHT HEART CATH N/A 09/30/2019   Procedure: RIGHT HEART CATH;  Surgeon: Jolaine Artist, MD;  Location: [facility];  Service: Cardiovascular;  Laterality: N/A;  . RIGHT/LEFT HEART CATH AND CORONARY ANGIOGRAPHY N/A 02/11/2019   Procedure: RIGHT/LEFT HEART CATH AND CORONARY ANGIOGRAPHY;  Surgeon: Jolaine Artist, MD;  Location: [facility];  Service: Cardiovascular;  Laterality: N/A;  . VIDEO BRONCHOSCOPY N/A 02/01/2019   Procedure: VIDEO BRONCHOSCOPY WITHOUT FLUORO;  Surgeon: Candee Furbish, MD;  Location: [facility];  Service: Endoscopy;  Laterality: N/A;     OB History   No obstetric history on file.     Family History  Problem Relation Age of Onset  . Diabetes Mellitus II Father   . Stroke Father   . Healthy Mother        She is 81 years old.     Social History   Tobacco Use  . Smoking status: Never Smoker  . Smokeless tobacco: Never Used  Vaping Use  . Vaping Use: Never used  Substance Use Topics  . Alcohol use: No  . Drug use: No    Home Medications Prior to Admission medications   Medication Sig Start Date End Date Taking? Authorizing Provider  Accu-Chek FastClix Lancets MISC USE AS DIRECTED TO TEST BLOOD SUGAR THREE TIMES DAILY 06/10/19   [name], MD  acetaminophen (TYLENOL) 500 MG tablet Take 500-1,000 mg by mouth every 6 (six) hours as needed for mild pain or headache.    [provider]  allopurinol (ZYLOPRIM) 100  MG tablet Take 2 tablets (200 mg total) by mouth at bedtime. 10/06/19   Georgette Shell, MD  amoxicillin-clavulanate (AUGMENTIN) 500-125 MG tablet Take 1 tablet (500 mg total) by mouth in the morning and at bedtime for 4 days. 11/26/19 11/30/19  Cristal Ford, DO  aspirin EC 81 MG tablet Take 81 mg by mouth in the morning.     [provider]  atorvastatin (LIPITOR) 80 MG tablet Take 80 mg by mouth at bedtime.     [provider]  Blood Glucose Monitoring Suppl (ACCU-CHEK GUIDE) w/Device KIT 1 each by Does not apply route 3 (three) times daily. Patient not taking: Reported on  11/06/2019 06/10/19   [name], MD  Continuous Blood Gluc Receiver (DEXCOM G6 RECEIVER) DEVI Use 1 each as directed 11/22/19   [name], MD  Continuous Blood Gluc Sensor (DEXCOM G6 SENSOR) MISC Use 1 each as directed 11/22/19   [name], MD  Continuous Blood Gluc Transmit (DEXCOM G6 TRANSMITTER) MISC Use 1 each as directed 11/22/19   [name], MD  DULoxetine (CYMBALTA) 30 MG capsule Take 1 capsule (30 mg total) by mouth daily. Patient taking differently: Take 30 mg by mouth at bedtime.  02/06/19   Bonnell Public, MD  EASY COMFORT PEN NEEDLES 31G X 5 MM MISC USE FOUR TIMES PER DAY FOR INSULIN ADMINISTRATION 10/02/18   [name], MD  fluticasone (FLONASE) 50 MCG/ACT nasal spray Place 2 sprays into both nostrils daily.    [provider]  gabapentin (NEURONTIN) 100 MG capsule TAKE 1 CAPSULE (100 MG TOTAL) BY MOUTH 2 (TWO) TIMES DAILY. Patient taking differently: Take 100 mg by mouth 2 (two) times daily.  10/17/19   [name], MD  glucose blood (ACCU-CHEK GUIDE) test strip USE AS DIRECTED TO TEST BLOOD SUGAR THREE TIMES DAILY 06/10/19   [name], MD  hydrALAZINE (APRESOLINE) 25 MG tablet Take 0.5 tablets (12.5 mg total) by mouth 3 (three) times daily. 10/06/19 10/05/20  Georgette Shell, MD  insulin aspart (NOVOLOG) 100 UNIT/ML injection 0 to 12 units subcutaneously 3 times daily before meals as per sliding scale 10/24/19   [name], MD  Insulin Glargine (BASAGLAR KWIKPEN) 100 UNIT/ML Inject 0.05 mLs (5 Units total) into the skin daily. 10/24/19   [name], MD  Lancet Device MISC Use as instructed 3 times daily 04/26/19   [name], MD  lansoprazole (PREVACID) 15 MG capsule TAKE 1 CAPSULE (15 MG TOTAL) BY MOUTH DAILY. Patient taking differently: Take 15 mg by mouth daily before breakfast.  09/05/19   [name], MD  Misc. Devices MISC Portable oxygen concentrator.  Diagnosis-chronic respiratory failure. 09/03/18   [name], MD    Misc. Devices MISC [place] with seat. Dx: Congestive Heart Failure 10/19/18   [name], MD  Misc. Devices MISC Scale; Dx - CHF 06/10/19   [name], MD  Misc. [facility] bed.  Diagnosis CHF.  Lifetime use.  Weight 165 lbs. 07/10/19   [name], MD  Misc. Devices [place] chair DX CHF 10/10/19   [name], MD  Misc. Devices MISC Manual wheelchai with cushion, anti - tippers, brakes and lock.  Diagnosis congestive heart failure.  Weight 140 pounds. 10/14/19   [name], MD  Multiple Vitamins-Minerals (MULTIVITAMIN WITH MINERALS) tablet Take 1 tablet by mouth daily with breakfast.     [provider]  OXYGEN Inhale 4 L/min into the lungs continuous.    [provider]  polyethylene glycol (MIRALAX /  GLYCOLAX) 17 g packet Take 17 g by mouth daily as needed for moderate constipation. Patient not taking: Reported on 10/08/2019 05/22/19   Geradine Girt, DO  potassium chloride SA (KLOR-CON) 20 MEQ tablet Take 1 tablet (20 mEq total) by mouth daily for 7 days. 11/26/19 12/03/19  Cristal Ford, DO  primidone (MYSOLINE) 50 MG tablet Take 1 tablet (50 mg total) by mouth in the morning and at bedtime. 08/19/19   Tat, Eustace Quail, DO  SUPER B COMPLEX/C CAPS Take 1 capsule by mouth daily.     [provider]  torsemide (DEMADEX) 20 MG tablet Take 2 tablets (40 mg total) by mouth 2 (two) times daily. 10/08/19 01/06/20  Bensimhon, Shaune Pascal, MD  Vitamin D, Ergocalciferol, (DRISDOL) 1.25 MG (50000 UNIT) CAPS capsule Take 50,000 Units by mouth every Sunday.    [provider]    Allergies    Garlic, Isosorbide, Latex, Morphine and related, and Other  Review of Systems   Review of Systems  All other systems reviewed and are negative.   Physical Exam Updated Vital Signs BP (!) 154/84   Pulse (!) 129   Temp 98.4 F (36.9 C) (Oral)   Resp (!) 22   SpO2 100%   Physical Exam Vitals and nursing note reviewed.  Constitutional:       Appearance: She is well-developed.  HENT:     Head: Normocephalic and atraumatic.  Cardiovascular:     Rate and Rhythm: Regular rhythm. Tachycardia present.     Heart sounds: No murmur heard.   Pulmonary:     Effort: Pulmonary effort is normal. No respiratory distress.     Comments: Occasional crackles in the bases bilaterally Abdominal:     Palpations: Abdomen is soft.     Tenderness: There is no abdominal tenderness. There is no guarding or rebound.  Musculoskeletal:        General: No tenderness.  Skin:    General: Skin is warm and dry.  Neurological:     Mental Status: She is alert and oriented to person, place, and time.  Psychiatric:        Behavior: Behavior normal.     ED Results / Procedures / Treatments   Labs (all labs ordered are listed, but only abnormal results are displayed) Labs Reviewed - No data to display  EKG None  Radiology No results found.  Procedures Procedures (including critical care time) CRITICAL CARE Performed by: Quintella Reichert   Total critical care time: 32 minutes  Critical care time was exclusive of separately billable procedures and treating other patients.  Critical care was necessary to treat or prevent imminent or life-threatening deterioration.  Critical care was time spent personally by me on the following activities: development of treatment plan with patient and/or surrogate as well as nursing, discussions with consultants, evaluation of patient's response to treatment, examination of patient, obtaining history from patient or surrogate, ordering and performing treatments and interventions, ordering and review of laboratory studies, ordering and review of radiographic studies, pulse oximetry and re-evaluation of patient's condition.  Medications Ordered in ED Medications - No data to display  ED Course  I have reviewed the triage vital signs and the nursing notes.  Pertinent labs & imaging results that were available  during my care of the patient were reviewed by me and considered in my medical decision making (see chart for details).    MDM Rules/Calculators/A&P  Patient with history of CHF, CKD, chronic oxygen use here for evaluation of shortness of breath since hospital discharge yesterday. She is ill appearing on evaluation with tachycardia, tachypnea and increased oxygen requirement. She does have scattered crackles in the bases. She was treated with Lasix for concern for volume overload. No evidence of pneumonia. Medicine consulted for admission for further treatment.  Final Clinical Impression(s) / ED Diagnoses Final diagnoses:  None    Rx / DC Orders ED Discharge Orders    None       Quintella Reichert, MD 11/27/19 1713    Quintella Reichert, MD 12/12/19 1049

## 2019-11-27 NOTE — ED Notes (Signed)
Pt work of breathing improved. Verbalized complete relief of chest pressure a this time. 100% on BiPap with RR 20

## 2019-11-27 NOTE — ED Triage Notes (Signed)
Pt here from home with c/o sob after being d/c from hospital yesterday sats in the 70's 4 liters pt arrived to the Ed on  A NRB stas in the 90's

## 2019-11-27 NOTE — Patient Outreach (Signed)
[facility]) Care Management  11/27/2019  Kara Hanson November 13, 1955 [id number]   Referral Date: 8/19 Referral Source: Hospital liaison Referral Reason: Post discharge Insurance: [employer] Medicare/Medicaid   Outreach attempt #1, successful to daughter.  Identity verified.  This care manager introduced self and stated purpose of call.  [facility] care management services explained.    Social: Daughter report member lives with her and her daughter as well as her 2 year granddaughter (member's granddaughter and great-granddaughter).  Member is dependent on other's for ADL's but she state she encourages member to do things for herself.  There is concern that member is in need of additional help in the home, however in the past she declined to have assessment done by [employer].  Daughter now state that she has discussed with member and she is willing to have someone come in to help.    Daughter report it has been hard on her because she work outside of the home 3 days a week.  Member's granddaughter has limited ability to help due to caring for the 65 year old.  She state member is in need of a tub transfer bench as well as a shower chair.  Member has PT/OT/RN ordered through [facility], denies being contacted as of yet, member was just discharged yesterday.  Advised to expect a cal within the next 24 hours.    Daughter report that Nira Conn, EMT with paramedicine, has been a tremendous help by coming weekly and managing member's medications, usually visiting on Wednesdays.    Conditions: Per chart, she has history of HTN, CHF, CAD, CKD, HLD, DM, and spinal stenosis.  Medications: Reviewed with daughter, report Nira Conn manages them.  State when she is home, she makes sure member takes them however report member is unable to take them independently due to hands shaking and risk of dropping pills.  She is hoping that the in home aide will be able to help with this as well.     Appointments: Member has appointment scheduled with heart failure clinic on 9/1 but daughter is hoping Nira Conn can reschedule this as she is only off on Thursdays and Fridays.  She has yet to schedule visit with PCP, will call to schedule.  Plan: RN CM will follow up with member within the next week.    Update:  Incoming call received from daughter stating member has been taken back to hospital for shortness of breath and chest pain.  Per notes, admission is pending.  Hospital liaison notified, will follow up pending discharge.  Valente David, [place], MSN [place] [phone]

## 2019-11-27 NOTE — ED Notes (Signed)
Pharmacy notified to verify medications so I can give

## 2019-11-27 NOTE — Progress Notes (Signed)
Paramedicine Encounter    Patient ID: Kara Hanson, female    DOB: May 02, 1955, 64 y.o.   MRN: [id number]   Patient Care Team: [name], MD as PCP - General (Family Medicine) Jerline Pain, MD as PCP - Cardiology (Cardiology) Valente David, RN as [place] [facility]  Patient Active Problem List   Diagnosis Date Noted   Abdominal pain 11/19/2019   CKD (chronic kidney disease), stage IV ([place]) 11/19/2019   Palliative care by specialist    Goals of care, counseling/discussion    Cardiorenal syndrome    Hyponatremia 09/26/2019   CHF exacerbation ([place]) 09/26/2019   Acute on chronic systolic CHF (congestive heart failure) ([place]) 05/19/2019   Elevated troponin 04/18/2019   Acute respiratory failure with hypoxia ([place]) 02/09/2019   NSTEMI (non-ST elevated myocardial infarction) ([place]) 02/09/2019   S/P thoracentesis    Atelectasis    Chronic respiratory failure ([place])    Acute renal failure superimposed on stage 3 chronic kidney disease ([place])    Epigastric pain    CHF (congestive heart failure) ([place]) 01/23/2019   CAP (community acquired pneumonia) 10/31/2018   Acute CHF (congestive heart failure) ([place]) 09/24/2018   SIRS (systemic inflammatory response syndrome) ([place]) 07/25/2018   COVID-19 virus infection 07/20/2018   Chronic diastolic CHF (congestive heart failure) ([place]) 07/20/2018   Pneumonia due to COVID-19 virus 07/20/2018   Vitamin D deficiency 04/02/2018   Spinal stenosis 03/29/2018   Acute on chronic respiratory failure with hypoxia ([place]) 03/20/2018   Controlled type 2 diabetes with neuropathy ([place]) 03/20/2018   CKD (chronic kidney disease), stage III 03/20/2018   Acute on chronic combined systolic and diastolic CHF (congestive heart failure) ([place]) 11/27/2017   AKI (acute kidney injury) ([place]) 11/27/2017   Acute respiratory distress 11/27/2017   Bulging lumbar disc 11/14/2017   Gout 11/14/2017   Hyperlipidemia LDL goal  <70 09/30/2016   Chest pain 06/17/2016   Stress-induced cardiomyopathy 05/19/2016   Type 2 diabetes mellitus with diabetic neuropathy, unspecified ([place]) 05/06/2016   Vertigo 05/06/2016   Controlled type 2 diabetes mellitus with hyperglycemia ([place]) 04/23/2016   Hypertension 04/23/2016   Normochromic normocytic anemia 04/23/2016   Syncope 04/23/2016    Current Outpatient Medications:    Accu-Chek FastClix Lancets MISC, USE AS DIRECTED TO TEST BLOOD SUGAR THREE TIMES DAILY, Disp: 102 each, Rfl: 12   acetaminophen (TYLENOL) 500 MG tablet, Take 500-1,000 mg by mouth every 6 (six) hours as needed for mild pain or headache., Disp: , Rfl:    allopurinol (ZYLOPRIM) 100 MG tablet, Take 2 tablets (200 mg total) by mouth at bedtime., Disp: 30 tablet, Rfl: 1   amoxicillin-clavulanate (AUGMENTIN) 500-125 MG tablet, Take 1 tablet (500 mg total) by mouth in the morning and at bedtime for 4 days., Disp: 8 tablet, Rfl: 0   aspirin EC 81 MG tablet, Take 81 mg by mouth in the morning. , Disp: , Rfl:    atorvastatin (LIPITOR) 80 MG tablet, Take 80 mg by mouth at bedtime. , Disp: , Rfl:    Blood Glucose Monitoring Suppl (ACCU-CHEK GUIDE) w/Device KIT, 1 each by Does not apply route 3 (three) times daily. (Patient not taking: Reported on 11/06/2019), Disp: 1 kit, Rfl: 0   Continuous Blood Gluc Receiver (DEXCOM G6 RECEIVER) DEVI, Use 1 each as directed, Disp: 1 each, Rfl: 0   Continuous Blood Gluc Sensor (DEXCOM G6 SENSOR) MISC, Use 1 each as directed, Disp: 1 each, Rfl: 3   Continuous Blood Gluc Transmit (DEXCOM G6 TRANSMITTER) MISC,  Use 1 each as directed, Disp: 1 each, Rfl: 3   DULoxetine (CYMBALTA) 30 MG capsule, Take 1 capsule (30 mg total) by mouth daily. (Patient taking differently: Take 30 mg by mouth at bedtime. ), Disp: 30 capsule, Rfl: 3   EASY COMFORT PEN NEEDLES 31G X 5 MM MISC, USE FOUR TIMES PER DAY FOR INSULIN ADMINISTRATION, Disp: 200 each, Rfl: 12   fluticasone (FLONASE) 50  MCG/ACT nasal spray, Place 2 sprays into both nostrils daily., Disp: , Rfl:    gabapentin (NEURONTIN) 100 MG capsule, TAKE 1 CAPSULE (100 MG TOTAL) BY MOUTH 2 (TWO) TIMES DAILY. (Patient taking differently: Take 100 mg by mouth 2 (two) times daily. ), Disp: 60 capsule, Rfl: 2   glucose blood (ACCU-CHEK GUIDE) test strip, USE AS DIRECTED TO TEST BLOOD SUGAR THREE TIMES DAILY, Disp: 100 each, Rfl: 12   hydrALAZINE (APRESOLINE) 25 MG tablet, Take 0.5 tablets (12.5 mg total) by mouth 3 (three) times daily., Disp: 120 tablet, Rfl: 11   insulin aspart (NOVOLOG) 100 UNIT/ML injection, 0 to 12 units subcutaneously 3 times daily before meals as per sliding scale, Disp: 30 mL, Rfl: 3   Insulin Glargine (BASAGLAR KWIKPEN) 100 UNIT/ML, Inject 0.05 mLs (5 Units total) into the skin daily., Disp: 30 mL, Rfl: 6   Lancet Device MISC, Use as instructed 3 times daily, Disp: 1 each, Rfl: 0   lansoprazole (PREVACID) 15 MG capsule, TAKE 1 CAPSULE (15 MG TOTAL) BY MOUTH DAILY. (Patient taking differently: Take 15 mg by mouth daily before breakfast. ), Disp: 30 capsule, Rfl: 2   Misc. Devices MISC, Portable oxygen concentrator.  Diagnosis-chronic respiratory failure., Disp: 1 each, Rfl: 0   Misc. Devices MISC, [place] with seat. Dx: Congestive Heart Failure, Disp: 1 each, Rfl: 0   Misc. Devices MISC, Scale; Dx - CHF, Disp: 1 each, Rfl: 0   Misc. Devices [facility] bed.  Diagnosis CHF.  Lifetime use.  Weight 165 lbs., Disp: 1 each, Rfl: 0   Misc. Devices MISC, Shower chair DX CHF, Disp: 1 each, Rfl: 0   Misc. Devices MISC, Manual wheelchai with cushion, anti - tippers, brakes and lock.  Diagnosis congestive heart failure.  Weight 140 pounds., Disp: 1 each, Rfl: 0   Multiple Vitamins-Minerals (MULTIVITAMIN WITH MINERALS) tablet, Take 1 tablet by mouth daily with breakfast. , Disp: , Rfl:    OXYGEN, Inhale 4 L/min into the lungs continuous., Disp: , Rfl:    polyethylene glycol (MIRALAX / GLYCOLAX) 17 g  packet, Take 17 g by mouth daily as needed for moderate constipation. (Patient not taking: Reported on 10/08/2019), Disp: , Rfl:    potassium chloride SA (KLOR-CON) 20 MEQ tablet, Take 1 tablet (20 mEq total) by mouth daily for 7 days., Disp: 7 tablet, Rfl: 0   primidone (MYSOLINE) 50 MG tablet, Take 1 tablet (50 mg total) by mouth in the morning and at bedtime., Disp: 180 tablet, Rfl: 1   SUPER B COMPLEX/C CAPS, Take 1 capsule by mouth daily. , Disp: , Rfl:    torsemide (DEMADEX) 20 MG tablet, Take 2 tablets (40 mg total) by mouth 2 (two) times daily., Disp: 120 tablet, Rfl: 3   Vitamin D, Ergocalciferol, (DRISDOL) 1.25 MG (50000 UNIT) CAPS capsule, Take 50,000 Units by mouth every Sunday., Disp: , Rfl:  Allergies  Allergen Reactions   Garlic Shortness Of Breath, Itching, Swelling and Other (See Comments)    "Raw garlic" = Hand itching and swelling   Isosorbide Other (See Comments)  Sneezing and runny nose   Latex Itching   Morphine And Related Itching and Other (See Comments)    Headache    Other Itching and Other (See Comments)    Reaction to newspaper ink- Headaches, also     Social History   Socioeconomic History   Marital status: Widowed    Spouse name: Not on file   Number of children: Not on file   Years of education: Not on file   Highest education level: Not on file  Occupational History   Not on file  Tobacco Use   Smoking status: Never Smoker   Smokeless tobacco: Never Used  Vaping Use   Vaping Use: Never used  Substance and Sexual Activity   Alcohol use: No   Drug use: No   Sexual activity: Not Currently    Birth control/protection: None  Other Topics Concern   Not on file  Social History Narrative   Not on file   Social Determinants of Health   Financial Resource Strain:    Difficulty of Paying Living Expenses: Not on file  Food Insecurity:    Worried About [place] in the Last Year: Not on file   Ran Out of Food  in the Last Year: Not on file  Transportation Needs:    Lack of Transportation (Medical): Not on file   Lack of Transportation (Non-Medical): Not on file  Physical Activity:    Days of Exercise per Week: Not on file   Minutes of Exercise per Session: Not on file  Stress:    Feeling of Stress : Not on file  Social Connections:    Frequency of Communication with Friends and Family: Not on file   Frequency of Social Gatherings with Friends and Family: Not on file   Attends Religious Services: Not on file   Active Member of Clubs or Organizations: Not on file   Attends Archivist Meetings: Not on file   Marital Status: Not on file  Intimate Partner Violence:    Fear of Current or Ex-Partner: Not on file   Emotionally Abused: Not on file   Physically Abused: Not on file   Sexually Abused: Not on file    Physical Exam Vitals reviewed.  Constitutional:      Appearance: She is normal weight. She is ill-appearing.  HENT:     Head: Normocephalic.     Nose: Nose normal.     Mouth/Throat:     Mouth: Mucous membranes are moist.     Pharynx: Oropharynx is clear.  Eyes:     Pupils: Pupils are equal, round, and reactive to light.  Cardiovascular:     Rate and Rhythm: Regular rhythm. Tachycardia present.     Pulses: Normal pulses.     Heart sounds: Normal heart sounds.  Pulmonary:     Effort: Respiratory distress present.     Breath sounds: Normal breath sounds. No stridor. No wheezing, rhonchi or rales.  Chest:     Chest wall: No tenderness.  Abdominal:     Palpations: Abdomen is soft.     Tenderness: There is abdominal tenderness.  Musculoskeletal:        General: Normal range of motion.     Cervical back: Normal range of motion.     Right lower leg: No edema.     Left lower leg: No edema.  Skin:    General: Skin is warm and dry.     Capillary Refill: Capillary refill takes less than  2 seconds.  Neurological:     Mental Status: She is alert. Mental  status is at baseline.  Psychiatric:        Mood and Affect: Mood normal.    Arrived for home visit for Jonay who was alert laying in bed reporting she was having abdominal pain causing her to have trouble breathing. Patient was breathing rapidly and was coached down to 22 breaths per min. Oxygen was 85% on 4 LPM with long nasal cannula tubing, I reduced tubing length and increased flow to 5LPM and oxygen levels came up to low 90's. Lung sounds were clear on assessment. Abdomen was soft but tender in the epigastric area, no palpable masses's. Guynell was able to reduce her breathing but continued to complain of abdominal pain. Charnell reports having her morning medications. I reviewed updated med list and confirmed meds. Pill box filled accordingly. I gave Brendaliz her daily dose of Potassium, Augmentin as well as some Tylenol. She took these without difficulty. Breathing and vitals were reassessed prior to completing home visit and she appeared stable. Selam agreed she felt okay and agreed to home health coming out tomorrow. I reminded Shayle of upcoming appointments and reminded her daughter as well. Home visit complete.  I will be out in one week.     1 hour later I received a phone call from patient reporting she was severely short of breath and sounded as if she was gasping for air and speaking in broken sentences. I advised patient she should slow her breathing and she was unable to do so. [place]ler requested transport to ED. I contacted EMS and patient was transported via ambulance to [facility]. Paramedics notified me that her oxygen levels never increased past 90% in the ambulance with NRB oxygen mask. Patient arrived at [facility] and care was transferred.   I plan to continue to follow, I will see her in one week and update daughter and aide services accordingly.    Visit complete.   Refills:  Torsemide Cymbalta      Future Appointments  Date Time Provider [place]  12/03/2019 10:30 AM  [facility] PA/NP [facility] None  12/04/2019  4:00 PM Jerline Pain, MD [id number]  01/29/2020  9:15 AM Tat, Eustace Quail, DO [facility] None     ACTION: Home visit completed Next visit planned for one week

## 2019-11-27 NOTE — Progress Notes (Signed)
Attempted ABGx2 without luck. Called another RT to attempt

## 2019-11-27 NOTE — Telephone Encounter (Signed)
Transition Care Management Follow-up Telephone Call Date of discharge and from where: 11/26/2019, [facility].  Patient currently in ED

## 2019-11-27 NOTE — ED Notes (Signed)
CBG 124 

## 2019-11-27 NOTE — ED Notes (Signed)
Pt is having increasing sob.  Pt requests to sit up on side of bed, given bedside table for support to tripod.  Pt VSS but HR remains high.  MD notified of pt symptoms.

## 2019-11-28 ENCOUNTER — Inpatient Hospital Stay (HOSPITAL_COMMUNITY): Payer: Medicare Other

## 2019-11-28 ENCOUNTER — Telehealth: Payer: Self-pay

## 2019-11-28 DIAGNOSIS — R0602 Shortness of breath: Secondary | ICD-10-CM

## 2019-11-28 DIAGNOSIS — K5792 Diverticulitis of intestine, part unspecified, without perforation or abscess without bleeding: Secondary | ICD-10-CM

## 2019-11-28 DIAGNOSIS — J9621 Acute and chronic respiratory failure with hypoxia: Secondary | ICD-10-CM

## 2019-11-28 DIAGNOSIS — I5043 Acute on chronic combined systolic (congestive) and diastolic (congestive) heart failure: Secondary | ICD-10-CM

## 2019-11-28 DIAGNOSIS — Z7189 Other specified counseling: Secondary | ICD-10-CM

## 2019-11-28 DIAGNOSIS — Z515 Encounter for palliative care: Secondary | ICD-10-CM

## 2019-11-28 LAB — I-STAT ARTERIAL BLOOD GAS, ED
Acid-base deficit: 3 mmol/L — ABNORMAL HIGH (ref 0.0–2.0)
Bicarbonate: 22.7 mmol/L (ref 20.0–28.0)
Calcium, Ion: 1.33 mmol/L (ref 1.15–1.40)
HCT: 29 % — ABNORMAL LOW (ref 36.0–46.0)
Hemoglobin: 9.9 g/dL — ABNORMAL LOW (ref 12.0–15.0)
O2 Saturation: 95 %
Patient temperature: 98.3
Potassium: 4.4 mmol/L (ref 3.5–5.1)
Sodium: 140 mmol/L (ref 135–145)
TCO2: 24 mmol/L (ref 22–32)
pCO2 arterial: 41.8 mmHg (ref 32.0–48.0)
pH, Arterial: 7.342 — ABNORMAL LOW (ref 7.350–7.450)
pO2, Arterial: 77 mmHg — ABNORMAL LOW (ref 83.0–108.0)

## 2019-11-28 LAB — TROPONIN I (HIGH SENSITIVITY): Troponin I (High Sensitivity): 90 ng/L — ABNORMAL HIGH (ref <18)

## 2019-11-28 LAB — CBG MONITORING, ED
Glucose-Capillary: 128 mg/dL — ABNORMAL HIGH (ref 70–99)
Glucose-Capillary: 99 mg/dL (ref 70–99)
Glucose-Capillary: 92 mg/dL (ref 70–99)
Glucose-Capillary: 85 mg/dL (ref 70–99)

## 2019-11-28 LAB — GLUCOSE, CAPILLARY
Glucose-Capillary: 74 mg/dL (ref 70–99)
Glucose-Capillary: 112 mg/dL — ABNORMAL HIGH (ref 70–99)

## 2019-11-28 MED ORDER — FENTANYL CITRATE (PF) 100 MCG/2ML IJ SOLN
25.0000 ug | INTRAMUSCULAR | Status: DC | PRN
  Administered 2019-11-28: 50 ug via INTRAVENOUS
  Filled 2019-11-28: qty 2

## 2019-11-28 NOTE — Consult Note (Signed)
Consultation Note Date: 11/28/2019   Patient Name: Kara Hanson  DOB: 18-Aug-1955  MRN: [id number]  Age / Sex: 64 y.o., female  PCP: Kara Rakes, MD Referring Physician: Dessa Phi, DO  Reason for Consultation: Establishing goals of care  HPI/Patient Profile: 64 y.o. female  with past medical history of chronic systolic and diastolic CHF (EF 09-62% on echo 09/2019), hypertension, insulin-dependent DM, chronic hypoxic respiratory failure on 4L of oxygen home, pneumonia due to COVID-19 (in 2020), and chronic kidney disease stage IV. She presented to the ED on 11/27/2019 with shortness of breath. In the ED, patient was requiring NRB mask. Chest x-ray showed pulmonary edema and she was treated with IV lasix. Patient has had multiple admissions in the last year for acute CHF. She was hospitalized 6/21-7/21 for acute CHF requiring milrinone. She was readmitted 8/17 for acute CHF and did not require milrinone.  Palliative care has been consulted to assist with goals of care.  Clinical Assessment and Goals of Care: I have reviewed medical records including EPIC notes, labs and imaging, and examined the patient while in the ED. She is alert and on BiPAP, and at the time of my assessment is about to transferred to 2W.    I spoke with daughter Kara Hanson by phone to discuss diagnosis, prognosis, [place], EOL wishes, disposition, and options.  I introduced Palliative Medicine as specialized medical care for people living with serious illness. It focuses on providing relief from the symptoms and stress of a serious illness.   We discussed a brief life review of the patient. She is originally from [place]. She married at age 70. They had 1 daughter Kara Hanson) and 2 sons. Her husband sadly passed away 3 years ago; Kara Hanson states she is still grieving his loss. Patient has lived with Kara Hanson for the past year. Kara Hanson has  functioned as the primary caregiver for her mother; the sons are not involved.   As far as functional status, Kara Hanson reports her mother "stays in bed all the time" and that she is very weak. She does not feel her mother has good quality of life.   We discussed her current illness and what it means in the larger context of her ongoing co-morbidities.  Natural disease trajectory of end stage heart failure was discussed. I discuss with Kara Hanson that per cardiology, her mother's heart failure is end stage at this point and she is not a candidate for advanced interventions. Kara Hanson verbalizes understanding, but expresses that this is also very difficult to hear.   Hospice and Palliative Care services outpatient were explained and offered. I provided education on hospice as a way to provide support in the home, manage her symptoms, and avoid hospitalization. Kara Hanson is receptive to the concept of hospice, but needs time to fully process. Kara Hanson expresses concern that her mother will be upset/scared by the word hospice.  I briefly addressed the issue of code status; Kara Hanson states this will be discussed further with her mother.   Primary decision maker: Patient, with support from daughter  Kara Hanson.     SUMMARY OF RECOMMENDATIONS   - full code, full scope for now with ongoing discussions - per cardiology, patient with end-stage heart failure and unfortunately not a candidate for advanced interventions - patient would be eligible for hospice care - palliative care will meet with patient and family tomorrow at 11:00  Code Status/Advance Care Planning:  Full code  Symptom Management:   Per primary team  Palliative Prophylaxis:   Frequent Pain Assessment and Turn Reposition  Psycho-social/Spiritual:   Desire for further Chaplaincy support:not assessed  Prognosis:   Likely less than 6 months in the setting of end stage heart failure  Discharge Planning: To Be Determined      Primary  Diagnoses: Present on Admission: . Acute on chronic respiratory failure with hypoxia ([place]) . Acute on chronic combined systolic and diastolic CHF (congestive heart failure) ([place]) . Hypertension . Type 2 diabetes mellitus with diabetic neuropathy, unspecified ([place]) . Hyperlipidemia LDL goal <70 . Elevated troponin   I have reviewed the medical record, interviewed the patient and family, and examined the patient. The following aspects are pertinent.  Past Medical History:  Diagnosis Date  . Acute CHF (congestive heart failure) ([place]) 09/24/2018  . Acute on chronic combined systolic and diastolic CHF (congestive heart failure) ([place]) 11/27/2017  . Acute on chronic respiratory failure with hypoxia ([place]) 03/20/2018  . Acute renal failure superimposed on stage 3 chronic kidney disease ([place])   . Acute respiratory distress 11/27/2017  . Acute respiratory failure with hypoxia ([place]) 02/09/2019  . Acute systolic CHF (congestive heart failure) ([place]) 05/19/2019  . AKI (acute kidney injury) ([place]) 11/27/2017  . Atelectasis   . Bulging lumbar disc 11/14/2017  . CAP (community acquired pneumonia) 10/31/2018  . Chest pain 06/17/2016  . CHF (congestive heart failure) ([place])   . Chronic diastolic CHF (congestive heart failure) ([place]) 07/20/2018  . Chronic respiratory failure ([place])   . CKD (chronic kidney disease), stage III 03/20/2018  . Controlled type 2 diabetes mellitus with hyperglycemia ([place]) 04/23/2016  . Controlled type 2 diabetes with neuropathy ([place]) 03/20/2018  . COVID-19 virus infection 07/20/2018  . CVA (cerebral vascular accident) ([place])   . Diabetes mellitus without complication ([place])   . Dyspnea   . Elevated troponin 04/18/2019  . Epigastric pain   . Gout 11/14/2017  . Hypercholesteremia   . Hyperlipidemia LDL goal <70 09/30/2016  . Hypertension   . Myocardial infarction ([place])   . Normochromic normocytic anemia 04/23/2016  . NSTEMI (non-ST elevated myocardial infarction) ([place]) 02/09/2019  .  Pneumonia 11/01/2018  . Pneumonia due to COVID-19 virus 07/20/2018  . S/P thoracentesis   . SIRS (systemic inflammatory response syndrome) ([place]) 07/25/2018  . Spinal stenosis   . Stress-induced cardiomyopathy 05/19/2016  . Syncope 04/23/2016  . Type 2 diabetes mellitus with diabetic neuropathy, unspecified ([place]) 05/06/2016  . Vertigo 05/06/2016  . Vitamin D deficiency 04/02/2018    Scheduled Meds: . allopurinol  200 mg Oral QHS  . aspirin EC  81 mg Oral q AM  . atorvastatin  80 mg Oral QHS  . DULoxetine  30 mg Oral QHS  . furosemide  80 mg Intravenous BID  . furosemide  80 mg Intravenous Once  . gabapentin  100 mg Oral BID  . heparin injection (subcutaneous)  5,000 Units Subcutaneous Q8H  . hydrALAZINE  12.5 mg Oral TID  . insulin aspart  0-15 Units Subcutaneous TID WC  . insulin aspart  0-5 Units Subcutaneous QHS  . pantoprazole  20  mg Oral Daily  . potassium chloride SA  20 mEq Oral Daily  . sodium chloride flush  3 mL Intravenous Q12H   Continuous Infusions: . sodium chloride    . cefTRIAXone (ROCEPHIN)  IV Stopped (11/27/19 1935)  . metronidazole Stopped (11/28/19 1104)   PRN Meds:.sodium chloride, acetaminophen, nitroGLYCERIN, ondansetron (ZOFRAN) IV, polyethylene glycol, sodium chloride flush Medications Prior to Admission:  Prior to Admission medications   Medication Sig Start Date End Date Taking? Authorizing Provider  acetaminophen (TYLENOL) 500 MG tablet Take 500-1,000 mg by mouth every 6 (six) hours as needed for mild pain or headache.   Yes [provider]  allopurinol (ZYLOPRIM) 100 MG tablet Take 2 tablets (200 mg total) by mouth at bedtime. 10/06/19  Yes Georgette Shell, MD  amoxicillin-clavulanate (AUGMENTIN) 500-125 MG tablet Take 1 tablet (500 mg total) by mouth in the morning and at bedtime for 4 days. 11/26/19 11/30/19 Yes Mikhail, Velta Addison, DO  aspirin EC 81 MG tablet Take 81 mg by mouth in the morning.    Yes [provider]  atorvastatin  (LIPITOR) 80 MG tablet Take 80 mg by mouth at bedtime.    Yes [provider]  DULoxetine (CYMBALTA) 30 MG capsule Take 1 capsule (30 mg total) by mouth daily. Patient taking differently: Take 30 mg by mouth at bedtime.  02/06/19  Yes Bonnell Public, MD  fluticasone (FLONASE) 50 MCG/ACT nasal spray Place 2 sprays into both nostrils daily as needed.    Yes [provider]  gabapentin (NEURONTIN) 100 MG capsule TAKE 1 CAPSULE (100 MG TOTAL) BY MOUTH 2 (TWO) TIMES DAILY. Patient taking differently: Take 100 mg by mouth 2 (two) times daily.  10/17/19  Yes Kara Rakes, MD  hydrALAZINE (APRESOLINE) 25 MG tablet Take 0.5 tablets (12.5 mg total) by mouth 3 (three) times daily. 10/06/19 10/05/20 Yes Georgette Shell, MD  lansoprazole (PREVACID) 15 MG capsule TAKE 1 CAPSULE (15 MG TOTAL) BY MOUTH DAILY. Patient taking differently: Take 15 mg by mouth daily before breakfast.  09/05/19  Yes Kara Rakes, MD  Misc. Devices MISC Portable oxygen concentrator.  Diagnosis-chronic respiratory failure. 09/03/18  Yes Kara Rakes, MD  Misc. Devices MISC [place] with seat. Dx: Congestive Heart Failure 10/19/18  Yes Kara Rakes, MD  Misc. Devices MISC Scale; Dx - CHF 06/10/19  Yes Kara Rakes, MD  Misc. [facility] bed.  Diagnosis CHF.  Lifetime use.  Weight 165 lbs. 07/10/19  Yes Kara Rakes, MD  Misc. Devices [place] chair DX CHF 10/10/19  Yes Kara Rakes, MD  Misc. Devices MISC Manual wheelchai with cushion, anti - tippers, brakes and lock.  Diagnosis congestive heart failure.  Weight 140 pounds. 10/14/19  Yes Kara Rakes, MD  Multiple Vitamins-Minerals (MULTIVITAMIN WITH MINERALS) tablet Take 1 tablet by mouth daily with breakfast.    Yes [provider]  OXYGEN Inhale 4 L/min into the lungs continuous.   Yes [provider]  polyethylene glycol (MIRALAX / GLYCOLAX) 17 g packet Take 17 g by mouth daily as needed for moderate constipation. 05/22/19   Yes Eulogio Bear U, DO  potassium chloride SA (KLOR-CON) 20 MEQ tablet Take 1 tablet (20 mEq total) by mouth daily for 7 days. 11/26/19 12/03/19 Yes Mikhail, Velta Addison, DO  primidone (MYSOLINE) 50 MG tablet Take 1 tablet (50 mg total) by mouth in the morning and at bedtime. 08/19/19  Yes Tat, Eustace Quail, DO  SUPER B COMPLEX/C CAPS Take 1 capsule by mouth daily.  Yes [provider]  torsemide (DEMADEX) 20 MG tablet Take 2 tablets (40 mg total) by mouth 2 (two) times daily. 10/08/19 01/06/20 Yes Bensimhon, Shaune Pascal, MD  Vitamin D, Ergocalciferol, (DRISDOL) 1.25 MG (50000 UNIT) CAPS capsule Take 50,000 Units by mouth every Sunday.    Yes [provider]  Accu-Chek FastClix Lancets MISC USE AS DIRECTED TO TEST BLOOD SUGAR THREE TIMES DAILY Patient not taking: Reported on 11/27/2019 06/10/19   Kara Rakes, MD  Blood Glucose Monitoring Suppl (ACCU-CHEK GUIDE) w/Device KIT 1 each by Does not apply route 3 (three) times daily. Patient not taking: Reported on 11/06/2019 06/10/19   Kara Rakes, MD  Continuous Blood Gluc Receiver (DEXCOM G6 RECEIVER) DEVI Use 1 each as directed Patient not taking: Reported on 11/27/2019 11/22/19   Kara Rakes, MD  Continuous Blood Gluc Sensor (DEXCOM G6 SENSOR) MISC Use 1 each as directed Patient not taking: Reported on 11/27/2019 11/22/19   Kara Rakes, MD  Continuous Blood Gluc Transmit (DEXCOM G6 TRANSMITTER) MISC Use 1 each as directed Patient not taking: Reported on 11/27/2019 11/22/19   Kara Rakes, MD  EASY COMFORT PEN NEEDLES 31G X 5 MM MISC USE FOUR TIMES PER DAY FOR INSULIN ADMINISTRATION Patient not taking: Reported on 11/27/2019 10/02/18   Kara Rakes, MD  glucose blood (ACCU-CHEK GUIDE) test strip USE AS DIRECTED TO TEST BLOOD SUGAR THREE TIMES DAILY Patient not taking: Reported on 11/27/2019 06/10/19   Kara Rakes, MD  insulin aspart (NOVOLOG) 100 UNIT/ML injection 0 to 12 units subcutaneously 3 times daily before meals as per sliding  scale Patient not taking: Reported on 11/27/2019 10/24/19   Kara Rakes, MD  Insulin Glargine (BASAGLAR KWIKPEN) 100 UNIT/ML Inject 0.05 mLs (5 Units total) into the skin daily. Patient not taking: Reported on 11/27/2019 10/24/19   Kara Rakes, MD  Lancet Device MISC Use as instructed 3 times daily Patient not taking: Reported on 11/27/2019 04/26/19   Kara Rakes, MD   Allergies  Allergen Reactions  . Garlic Shortness Of Breath, Itching, Swelling and Other (See Comments)    "Raw garlic" = Hand itching and swelling  . Isosorbide Other (See Comments)    Sneezing and runny nose  . Latex Itching  . Morphine And Related Itching and Other (See Comments)    Headache   . Other Itching and Other (See Comments)    Reaction to newspaper ink- Headaches, also   Review of Systems  Unable to perform ROS: Other    Physical Exam Vitals reviewed.  Constitutional:      Comments: Chronically ill-appearing  HENT:     Head: Normocephalic and atraumatic.  Cardiovascular:     Rate and Rhythm: Normal rate.  Pulmonary:     Comments: BiPAP Neurological:     Mental Status: She is alert.     Comments: Unable to assess orientation  Psychiatric:        Mood and Affect: Affect is tearful.     Vital Signs: BP (!) 142/68   Pulse 84   Temp 98.9 F (37.2 C) (Axillary)   Resp 16   SpO2 100%  Pain Scale: 0-10   Pain Score: 0-No pain   SpO2: SpO2: 100 % O2 Device:SpO2: 100 % O2 Flow Rate: .O2 Flow Rate (L/min): 15 L/min  Palliative Assessment/Data: PPS 30-40%   Time In: 18:00 Time Out: 18:50 Time Total: 50 minutes Greater than 50%  of this time was spent counseling and coordinating care related to the above assessment and plan.  Signed  by: Lavena Bullion, NP   Please contact Palliative Medicine Team phone at [phone] for questions and concerns.  For individual provider: See Shea Evans

## 2019-11-28 NOTE — Telephone Encounter (Signed)
Transition Care Management Follow-up Telephone Call Date of discharge and from where: 11/26/2019, [facility]   Patient is still in the ED

## 2019-11-28 NOTE — ED Notes (Signed)
Ordered breakfast 

## 2019-11-28 NOTE — ED Notes (Signed)
SDU 

## 2019-11-28 NOTE — Progress Notes (Addendum)
PROGRESS NOTE    Kara Hanson  SWF:[id number] DOB: Feb 10, 1956 DOA: 11/27/2019 PCP: [name], MD     Brief Narrative:  Kara Hanson is a 64 y.o. female with medical history significant of hypertension, systolic and diastolic congestive heart failure(last EF20-25%), insulin-dependent diabetes mellitus, chronic hypoxic respiratory failure on 4 L of nasal cannula oxygen, pneumonia due to COVID-19, chronic kidney disease stage IV presented to hospital with complaint of shortness of breath.  Patient was admitted on 11/19/2019 and discharged on 11/26/2019.  She was hospitalized for acute on chronic hypoxic respiratory failure secondary to acute on chronic combined systolic and diastolic heart failure as well as acute diverticulitis.  According to patient, she was feeling fine when she was discharged from hospital but upon arrival to home, she started feeling shortness of breath. Reportedly, when EMS arrived, she was found to be saturating 70% while on 4 L nasal oxygen which is her baseline oxygen use.  She was brought into the emergency department and was on nonrebreather. Chest x-ray showed pulmonary edema. She was given IV lasix.   New events last 24 hours / Subjective: Patient seen in the emergency department, she remains on BiPAP.  She states that her breathing has improved on BiPAP.  Denies any worsening peripheral edema or chest pain.  Assessment & Plan:   Principal Problem:   Acute on chronic combined systolic and diastolic CHF (congestive heart failure) (HCC) Active Problems:   Hypertension   Type 2 diabetes mellitus with diabetic neuropathy, unspecified (HCC)   Hyperlipidemia LDL goal <70   Acute on chronic respiratory failure with hypoxia (HCC)   Elevated troponin    Acuteon chronicrespiratory failure with hypoxia secondary to acute on chronic combined systolic and diastolicCHF exacerbation -At baseline, patient requires 4 L of nasal cannula oxygen.  Currently on BiPAP  and wean as able -BNP 1337.9 -Chest x-ray revealed mild pulmonary edema, small bilateral pleural effusions -Continue IV Lasix -Strict I's and O's, daily weight -Cardiology consulted  Acute diverticulitis -She was diagnosed with acute diverticulitis during recent hospitalization.  She was discharged on oral Augmentin to complete 10 days course (last day 8/27).  Continue Rocephin/flagyl while in hospital  Demand ischemia -Elevated troponin likely secondary to heart failure exacerbation as well as respiratory failure.  Patient denies any chest pain  Diabetes mellitus type 2, with complication including peripheral neuropathy -Hemoglobin A1c 6.1 -Novolog SSI, Neurontin  Essential hypertension -Continue hydralazine, Lasix -Coreg held by cardiology during recent hospitalization  Chronic kidney disease stage IV -Baseline Cr 1.8-1.9 -Stable  Hyperlipidemia -Continue statin  Depression -Continue Cymbalta    DVT prophylaxis:  heparin injection 5,000 Units Start: 11/27/19 2200  Code Status: Full Family Communication: No family at bedside Disposition Plan:   Status is: Inpatient  Remains inpatient appropriate because:Hemodynamically unstable, IV treatments appropriate due to intensity of illness or inability to take PO and Inpatient level of care appropriate due to severity of illness   Dispo: The patient is from: Home              Anticipated d/c is to: Home              Anticipated d/c date is: 3 days              Patient currently is not medically stable to d/c. Remains on BiPAP this morning, continue IV lasix for diureses, cardiology consult.    Consultants:   Cardiology  Procedures:   None   Antimicrobials:  Anti-infectives (From admission, onward)  Start     Dose/Rate Route Frequency Ordered Stop   11/27/19 2200  amoxicillin-clavulanate (AUGMENTIN) 500-125 MG per tablet 500 mg  Status:  Discontinued        1 tablet Oral 2 times daily 11/27/19 1701  11/27/19 1709   11/27/19 1830  metroNIDAZOLE (FLAGYL) IVPB 500 mg        500 mg 100 mL/hr over 60 Minutes Intravenous Every 8 hours 11/27/19 1709 11/29/19 2359   11/27/19 1800  cefTRIAXone (ROCEPHIN) 2 g in sodium chloride 0.9 % 100 mL IVPB        2 g 200 mL/hr over 30 Minutes Intravenous Every 24 hours 11/27/19 1709 11/29/19 2359       Objective: Vitals:   11/28/19 0608 11/28/19 0645 11/28/19 0836 11/28/19 0900  BP:   137/67 (!) 153/87  Pulse:  88  95  Resp:  16  18  Temp:    98.9 F (37.2 C)  TempSrc:    Axillary  SpO2: 100% 100%  100%   No intake or output data in the 24 hours ending 11/28/19 1049 There were no vitals filed for this visit.  Examination:  General exam: Appears calm and comfortable  Respiratory system: Clear to auscultation anteriorly.  No distress on BiPAP Cardiovascular system: S1 & S2 heard, tachycardic, regular rhythm, rate 100s. No murmurs. No pedal edema. Gastrointestinal system: Abdomen is nondistended, soft and nontender. Normal bowel sounds heard. Central nervous system: Alert and oriented. Speech clear.  Extremities: Symmetric in appearance  Skin: No rashes, lesions or ulcers on exposed skin  Psychiatry: Judgement and insight appear normal. Mood & affect appropriate.   Data Reviewed: I have personally reviewed following labs and imaging studies  CBC: Recent Labs  Lab 11/22/19 0430 11/23/19 0201 11/24/19 0506 11/26/19 0851 11/27/19 1440  WBC 4.6  --   --   --  7.6  NEUTROABS  --   --   --   --  6.6  HGB 7.8* 8.1* 8.1* 7.8* 9.4*  HCT 25.7* 26.7* 26.9* 26.4* 31.8*  MCV 92.8  --   --   --  96.1  PLT 284  --   --   --  676   Basic Metabolic Panel: Recent Labs  Lab 11/23/19 0201 11/24/19 0506 11/25/19 0609 11/26/19 0851 11/27/19 1440 11/27/19 1804  NA 139 138 138 140 140  --   K 3.5 3.5 3.5 3.4* 4.3  --   CL 105 105 103 107 106  --   CO2 24 22 22 24  21*  --   GLUCOSE 85 140* 81 92 128*  --   BUN 51* 46* 42* 38* 38*  --     CREATININE 1.93* 1.80* 1.72* 1.83* 1.91*  --   CALCIUM 9.4 8.9 9.1 9.1 9.3  --   MG  --   --   --   --   --  1.9   GFR: Estimated Creatinine Clearance: 24.9 mL/min (A) (by C-G formula based on SCr of 1.91 mg/dL (H)). Liver Function Tests: Recent Labs  Lab 11/27/19 1440  AST 15  ALT 19  ALKPHOS 80  BILITOT 0.5  PROT 6.4*  ALBUMIN 2.9*   No results for input(s): LIPASE, AMYLASE in the last 168 hours. No results for input(s): AMMONIA in the last 168 hours. Coagulation Profile: Recent Labs  Lab 11/27/19 1440  INR 1.2   Cardiac Enzymes: No results for input(s): CKTOTAL, CKMB, CKMBINDEX, TROPONINI in the last 168 hours. BNP (last 3 results) No results  for input(s): PROBNP in the last 8760 hours. HbA1C: Recent Labs    11/27/19 1804  HGBA1C 6.1*   CBG: Recent Labs  Lab 11/25/19 0744 11/25/19 1206 11/25/19 1646 11/25/19 2051 11/26/19 0856  GLUCAP 116* 133* 100* 175* 92   Lipid Profile: No results for input(s): CHOL, HDL, LDLCALC, TRIG, CHOLHDL, LDLDIRECT in the last 72 hours. Thyroid Function Tests: No results for input(s): TSH, T4TOTAL, FREET4, T3FREE, THYROIDAB in the last 72 hours. Anemia Panel: No results for input(s): VITAMINB12, FOLATE, FERRITIN, TIBC, IRON, RETICCTPCT in the last 72 hours. Sepsis Labs: Recent Labs  Lab 11/23/19 1306  LATICACIDVEN 0.5    Recent Results (from the past 240 hour(s))  SARS Coronavirus 2 by RT PCR (hospital order, performed in [facility] hospital lab) Nasopharyngeal Nasopharyngeal Swab     Status: None   Collection Time: 11/19/19  4:39 AM   Specimen: Nasopharyngeal Swab  Result Value Ref Range Status   SARS Coronavirus 2 NEGATIVE NEGATIVE Final    Comment: (NOTE) SARS-CoV-2 target nucleic acids are NOT DETECTED.  The SARS-CoV-2 RNA is generally detectable in upper and lower respiratory specimens during the acute phase of infection. The lowest concentration of SARS-CoV-2 viral copies this assay can detect is 250 copies  / mL. A negative result does not preclude SARS-CoV-2 infection and should not be used as the sole basis for treatment or other patient management decisions.  A negative result may occur with improper specimen collection / handling, submission of specimen other than nasopharyngeal swab, presence of viral mutation(s) within the areas targeted by this assay, and inadequate number of viral copies (<250 copies / mL). A negative result must be combined with clinical observations, patient history, and epidemiological information.  Fact Sheet for Patients:   StrictlyIdeas.no  Fact Sheet for Healthcare Providers: BankingDealers.co.za  This test is not yet approved or  cleared by the [place] FDA and has been authorized for detection and/or diagnosis of SARS-CoV-2 by FDA under an Emergency Use Authorization (EUA).  This EUA will remain in effect (meaning this test can be used) for the duration of the COVID-19 declaration under Section 564(b)(1) of the Act, 21 U.S.C. section 360bbb-3(b)(1), unless the authorization is terminated or revoked sooner.  Performed at [facility], [place] [address], [place], [place] [address]   C Difficile Quick Screen w PCR reflex     Status: Abnormal   Collection Time: 11/21/19  1:36 PM   Specimen: STOOL  Result Value Ref Range Status   C Diff antigen POSITIVE (A) NEGATIVE Final   C Diff toxin NEGATIVE NEGATIVE Final   C Diff interpretation Results are indeterminate. See PCR results.  Final    Comment: Performed at [facility], [place] [address], [place], [place] [address]  C. Diff by PCR, Reflexed     Status: None   Collection Time: 11/21/19  1:36 PM  Result Value Ref Range Status   Toxigenic C. Difficile by PCR NEGATIVE NEGATIVE Final    Comment: Patient is colonized with non toxigenic C. difficile. May not need treatment unless significant symptoms are present. Performed at [facility],  [place] [address], [place], [place] [address]   SARS Coronavirus 2 by RT PCR (hospital order, performed in [facility] hospital lab) Nasopharyngeal Nasopharyngeal Swab     Status: None   Collection Time: 11/27/19  7:06 PM   Specimen: Nasopharyngeal Swab  Result Value Ref Range Status   SARS Coronavirus 2 NEGATIVE NEGATIVE Final  Comment: (NOTE) SARS-CoV-2 target nucleic acids are NOT DETECTED.  The SARS-CoV-2 RNA is generally detectable in upper and lower respiratory specimens during the acute phase of infection. The lowest concentration of SARS-CoV-2 viral copies this assay can detect is 250 copies / mL. A negative result does not preclude SARS-CoV-2 infection and should not be used as the sole basis for treatment or other patient management decisions.  A negative result may occur with improper specimen collection / handling, submission of specimen other than nasopharyngeal swab, presence of viral mutation(s) within the areas targeted by this assay, and inadequate number of viral copies (<250 copies / mL). A negative result must be combined with clinical observations, patient history, and epidemiological information.  Fact Sheet for Patients:   StrictlyIdeas.no  Fact Sheet for Healthcare Providers: BankingDealers.co.za  This test is not yet approved or  cleared by the [place] FDA and has been authorized for detection and/or diagnosis of SARS-CoV-2 by FDA under an Emergency Use Authorization (EUA).  This EUA will remain in effect (meaning this test can be used) for the duration of the COVID-19 declaration under Section 564(b)(1) of the Act, 21 U.S.C. section 360bbb-3(b)(1), unless the authorization is terminated or revoked sooner.  Performed at [facility], [place] [address] [place], [place] [address]       Radiology Studies: DG Chest Port 1 View  Result Date: 11/27/2019 CLINICAL DATA:  Shortness of breath EXAM: PORTABLE  CHEST 1 VIEW COMPARISON:  11/19/2019 FINDINGS: Persistent interstitial prominence with patchy increased density and small bilateral pleural effusions with bibasilar atelectasis. Similar cardiomegaly. No pneumothorax. IMPRESSION: Similar appearance with probable mild pulmonary edema and small bilateral pleural effusions with bibasilar atelectasis. Electronically Signed   By: Macy Mis M.D.   On: 11/27/2019 15:04      Scheduled Meds: . allopurinol  200 mg Oral QHS  . aspirin EC  81 mg Oral q AM  . atorvastatin  80 mg Oral QHS  . DULoxetine  30 mg Oral QHS  . furosemide  80 mg Intravenous BID  . furosemide  80 mg Intravenous Once  . gabapentin  100 mg Oral BID  . heparin injection (subcutaneous)  5,000 Units Subcutaneous Q8H  . hydrALAZINE  12.5 mg Oral TID  . insulin aspart  0-15 Units Subcutaneous TID WC  . insulin aspart  0-5 Units Subcutaneous QHS  . pantoprazole  20 mg Oral Daily  . potassium chloride SA  20 mEq Oral Daily  . sodium chloride flush  3 mL Intravenous Q12H   Continuous Infusions: . sodium chloride    . cefTRIAXone (ROCEPHIN)  IV Stopped (11/27/19 1935)  . metronidazole 500 mg (11/28/19 0949)     LOS: 1 day      Time spent: 35 minutes   Dessa Phi, DO [facility] 11/28/2019, 10:49 AM   Available via Epic secure chat 7am-7pm After these hours, please refer to coverage provider listed on [email or web address]

## 2019-11-28 NOTE — ED Notes (Signed)
Please call daughter Emelee Rodocker @ [phone]--QJF a status update

## 2019-11-28 NOTE — ED Notes (Signed)
Dinner ordered 

## 2019-11-28 NOTE — Progress Notes (Signed)
Patient seen for abdominal pain. She is admitted with acute on chronic hypoxic respiratory failure secondary to acute on chronic CHF, is due to complete antibiotics for diverticulitis tomorrow, states that the abdominal pain attributed to diverticulitis had been resolved until that same pain returned tonight.   She is on BiPAP, mildly tachypneic and tachycardic, BP stable. No pallor or diaphoresis. Abdomen is soft, tender in LUQ but without rebound pain or guarding.   She may need repeat CT abd/pelvis but remains on BiPAP and sitting upright, may not be able to tolerate supine positioning for CT. Plan to get KUB now and treat pain.

## 2019-11-28 NOTE — ED Notes (Signed)
Pt moved over to hospital bed for comfort.  Repositioned pt.  Offered pt breakfast which pt declined.  Pt only took some sips of water with meds.  Call bell in reach, pt phone with her and being charged.

## 2019-11-28 NOTE — Consult Note (Addendum)
Advanced Heart Failure Team Consult Note   Primary Physician: [name], MD PCP-Cardiologist:  Candee Furbish, MD  [facility]: Dr. Haroldine Laws   Reason for Consultation: Acute on chronic systolic heart failure   HPI:    Kara Hanson is seen today for evaluation of acute on chronic systolic heart failure at the request of Dr. Maylene Roes, Internal Medicine.   64 y/o female w/ h/o HTN, HLD, IDDM, Stage 3 CKD, chronic systolic and diastolic heart failure, NICM and history of poor med compliance. She is followed by Paramedicine. Multiple admissions in the last year for a/c CHF. She was just hospitalized 6/21-7/21 for a/c CHF requiring milrinone.   In 2018, she was admitted for syncope. Echo showed reduced LVEF 30-35%. Had cardiac cath at that time that showed normal coronaries and LVEF was normal by LVG ~50-55%, by visual estimate. No etiology for syncope found. Thought maybe to have a stress induced CM. Has been followed by [facility] and has had several interval echos since that time. Repeat Echos in 11/2017 and again 10/2018 showed normal LVEF, 50-55% and G2DD.   Admitted 07/2018 for COVID PNA.   Admitted 09/1535 for a/c diastolic HF and diuresed w/ IV Lasix.   Admitted again 01/2019 for a/c CHF and echo was repeated showing mildly reduced LVEF at 40-45%. Echo repeated again 11/20 showed further reduction in LVEF down to 30-35% w/ G3DD, which led to repeat cardiac catheterization showing mild nonobstructive CAD c/w NICM. RHC showed elevated filling pressures and normal CO. She was diuresed w/ IV Lasix.   Admitted again 04/2019 for a/c CHF, diuresed w/ IV Lasix.   Readmitted again late 6/21-7/21 for a/c CHF w/ NYHA Class IIIb symptoms and worsening renal function/ cardiorenal syndrome. Her SCr rose to 2.5 and she had poor initial response to IV diuretics. Echo repeated and LVEF lower, 20-25%, w/ G3DD, restrictive filling and mild asymmetric left ventricular hypertrophy of the inferolateral  segment. RV normal. Had [place] 6/28 showing elevated filling pressures and low output. CI 1.4. She was started on milrinone and high dose diuretics, with improved diuresis on inotropic support.   She had cMRI 10/01/19 w/ LVEF 26%, RVEF 46%, no subendocardial delayed myocardial enhancement, ECV is 29%, and no evidence of cardiac amyloidosis. This was followed by PYP scan 10/02/2019 that was read by radiologist to be equivocal for TTR amyloidosis. However study was reviewed by Dr. Haroldine Laws and TTR felt to be unlikely given negative MRI negative. Multiple myeloma pane was also unremarkable, there was no evidence of monoclonal protein. After adequate diuresis, she was weaned off milrinone and placed back on PO diuretics, torsemide 40 mg bid.  She was not felt to be a good candidate for advanced therapies given her h/o poor compliance. She was referred back to paramedicine post discharge to help w/ compliance. GDMT limited by CKD (no ARB/ARNi, spiro nor digoxin). She was discharged home only on hydralazine (intolerant to Imdur). Coreg was discontinued due to low output. Plan was to initiate possible SGLT2i at clinic f/u, pending renal function stabilization. Unfortunately, she never followed up in [facility].   She was readmitted again 8/17 for a/c sCHF. She was managed by general cardiology. She responded well to IV Lasix and did not require milrinone. Transitioned back to PO torsemide. She was discharged on 8/23. D/c wt 141 lb. Also of note, during last hospitalization, she was also diagnosed w/ acute diverticulitis. She was treated w/ Rocephin + Flagyl during hospitalization and transitioned to oral Augmentin at d/c, to  complete 10 day course of abx therapy.   She now presents back to the [facility] w/ recurrent dyspnea and a/c hypoxic respiratory failure. O2 sats 70% on RA. Placed on NRB. CXR shows pulmonary edema. BNP 1,337 (up from 867). Hs troponin 19>>27>>90.  Her BP is elevated. COVID negative. SCr 1.91. GFR 32.  K 4.3.  LFTs WNL. Hgb 7.8>>9.4>>9.9. Hgb A1c 6.1. She is being admitted by IM. 80 mg IV Lasix given in ED.   She remains on NRB. SBP in the 140s. She tells me she is not very functional at home. Dependent on family to assist w/ ADLs. NYHA IIIb-IV. Spends most of her time in bed. Appetite has been poor.      Cardiac Studies  2D Echo 09/27/19  Left ventricular ejection fraction, by estimation, is 20 to 25%. The left ventricle has severely decreased function. The left ventricle demonstrates global hypokinesis. The left ventricular internal cavity size was mildly dilated. There is mild asymmetric left ventricular hypertrophy of the inferolateral segment. Left ventricular diastolic parameters are consistent with Grade III diastolic dysfunction (restrictive). Elevated left ventricular end-diastolic pressure. 2. Right ventricular systolic function is normal. The right ventricular size is normal. There is mildly elevated pulmonary artery systolic pressure. The estimated right ventricular systolic pressure is 93.2 mmHg. 3. Left atrial size was moderately dilated. 4. The mitral valve is grossly normal. Mild mitral valve regurgitation due to LV dysfunction with leaflet tenting. 5. Tricuspid valve regurgitation is moderate. 6. The aortic valve is abnormal. AV gradient may be underestimated due to LV dysfunction. 7. The inferior vena cava is normal in size with <50% respiratory variability, suggesting right atrial pressure of 8 mmHg. ___________________ RHC 09/30/19 RA = 13 RV = 63/16 PA =  62/23 (40) PCW = 27 (v=43) Fick cardiac output/index = 4.8/2.9 Thermo CO/CI = 2.3/1.4 PVR = 5.7 WU (Thermo) Ao sat = 95% PA sat = 54%, 56%  Assessment: 1. Markedly elevated biventricular filling pressures 2. Marked discrepancy between Fick and Thermo cardiac outputs; clinical picture most c/w Thermo numbers ____________________ cMRI 10/01/19 IMPRESSION: 1. Severely reduced left ventricular systolic function.  LVEF 26%. Mild-moderately enlarged LV chamber size.  2. Borderline reduced right ventricular systolic function. RVEF 46%. Normal RV chamber size.  3. No subendocardial delayed myocardial enhancement, myocardium appears viable.  4. There is post contrast delayed myocardial enhancement in the LV myocardium at the inferior RV insertion point which can be seen with increased pulmonary pressure.  5. Normal myocardial nulling kinetics, no evidence of cardiac amyloidosis.  6. ECV is 29%, which may be consistent with hypertensive heart Disease. ________________________ PYP Scan 10/02/19  IMPRESSION: Visual and quantitative assessment (grade 1, H/CLL equal 1.3) are equivocal for transthyretin amyloidosis.  Greater than normal random noise within the SPECT imaging.  Consider repeat imaging in the future.   Review of Systems: [y] = yes, _0  = no    General: Weight gain _1 ; Weight loss _2 ; Anorexia _3 ; Fatigue Valu.Nieves ]; Fever _4 ; Chills _5 ; Weakness [ X]   Cardiac: Chest pain/pressure _6 ; Resting SOB Valu.Nieves ]; Exertional SOB [ X]; Orthopnea Valu.Nieves ]; Pedal Edema _7 ; Palpitations _8 ; Syncope _9 ; Presyncope _10 ; Paroxysmal nocturnal dyspnea[ X]   Pulmonary: Cough _11 ; Wheezing_12 ; Hemoptysis_13 ; Sputum _14 ; Snoring _15    GI: Vomiting_16 ; Dysphagia_17 ; Melena_18 ; Hematochezia _19 ; Heartburn_20 ; Abdominal pain _21 ; Constipation _22 ; Diarrhea _23 ; BRBPR _24   GU: Hematuria_0 ; Dysuria _1 ; Nocturia_2    Vascular: Pain in legs with walking _3 ; Pain in feet with lying flat _4 ; Non-healing sores _5 ; Stroke _6 ; TIA _7 ; Slurred speech _8 ;   Neuro: Headaches_9 ; Vertigo_10 ; Seizures_11 ; Paresthesias_12 ;Blurred vision _13 ; Diplopia _14 ; Vision changes _15    Ortho/Skin: Arthritis _16 ; Joint pain _17 ; Muscle pain _18 ; Joint swelling _19 ; Back Pain _20 ; Rash _21    Psych: Depression_22 ; Anxiety_23    Heme: Bleeding problems _24 ; Clotting disorders _25 ; Anemia _26    Endocrine: Diabetes  _27 ; Thyroid dysfunction_28   Home Medications Prior to Admission medications   Medication Sig Start Date End Date Taking? Authorizing Provider  acetaminophen (TYLENOL) 500 MG tablet Take 500-1,000 mg by mouth every 6 (six) hours as needed for mild pain or headache.   Yes [provider]  allopurinol (ZYLOPRIM) 100 MG tablet Take 2 tablets (200 mg total) by mouth at bedtime. 10/06/19  Yes Georgette Shell, MD  amoxicillin-clavulanate (AUGMENTIN) 500-125 MG tablet Take 1 tablet (500 mg total) by mouth in the morning and at bedtime for 4 days. 11/26/19 11/30/19 Yes Mikhail, Velta Addison, DO  aspirin EC 81 MG tablet Take 81 mg by mouth in the morning.    Yes [provider]  atorvastatin (LIPITOR) 80 MG tablet Take 80 mg by mouth at bedtime.    Yes [provider]  DULoxetine (CYMBALTA) 30 MG capsule Take 1 capsule (30 mg total) by mouth daily. Patient taking differently: Take 30 mg by mouth at bedtime.  02/06/19  Yes Bonnell Public, MD  fluticasone (FLONASE) 50 MCG/ACT nasal spray Place 2 sprays into both nostrils daily as needed.    Yes [provider]  gabapentin (NEURONTIN) 100 MG capsule TAKE 1 CAPSULE (100 MG TOTAL) BY MOUTH 2 (TWO) TIMES DAILY. Patient taking differently: Take 100 mg by mouth 2 (two) times daily.  10/17/19  Yes [name], MD  hydrALAZINE (APRESOLINE) 25 MG tablet Take 0.5 tablets (12.5 mg total) by mouth 3 (three) times daily. 10/06/19 10/05/20 Yes Georgette Shell, MD  lansoprazole (PREVACID) 15 MG capsule TAKE 1 CAPSULE (15 MG TOTAL) BY MOUTH DAILY. Patient taking differently: Take 15 mg by mouth daily before breakfast.  09/05/19  Yes [name], MD  Misc. Devices MISC Portable oxygen concentrator.  Diagnosis-chronic respiratory failure. 09/03/18  Yes [name], MD  Misc. Devices MISC [place] with seat. Dx: Congestive Heart Failure 10/19/18  Yes [name], MD  Misc. Devices MISC Scale; Dx - CHF 06/10/19  Yes [name],  MD  Misc. [facility] bed.  Diagnosis CHF.  Lifetime use.  Weight 165 lbs. 07/10/19  Yes [name], MD  Misc. Devices Beatrice chair DX CHF 10/10/19  Yes [name], MD  Misc. Devices MISC Manual wheelchai with cushion, anti - tippers, brakes and lock.  Diagnosis congestive heart failure.  Weight 140 pounds. 10/14/19  Yes [name], MD  Multiple Vitamins-Minerals (MULTIVITAMIN WITH MINERALS) tablet Take 1 tablet by mouth daily with breakfast.    Yes [provider]  OXYGEN Inhale 4 L/min into the lungs continuous.   Yes [provider]  polyethylene glycol (MIRALAX / GLYCOLAX) 17 g packet Take 17 g by mouth daily as needed for moderate constipation. 05/22/19  Yes Eulogio Bear U, DO  potassium chloride SA (KLOR-CON) 20 MEQ tablet Take 1 tablet (20 mEq total) by  mouth daily for 7 days. 11/26/19 12/03/19 Yes Mikhail, Velta Addison, DO  primidone (MYSOLINE) 50 MG tablet Take 1 tablet (50 mg total) by mouth in the morning and at bedtime. 08/19/19  Yes Tat, Eustace Quail, DO  SUPER B COMPLEX/C CAPS Take 1 capsule by mouth daily.    Yes [provider]  torsemide (DEMADEX) 20 MG tablet Take 2 tablets (40 mg total) by mouth 2 (two) times daily. 10/08/19 01/06/20 Yes Bensimhon, Shaune Pascal, MD  Vitamin D, Ergocalciferol, (DRISDOL) 1.25 MG (50000 UNIT) CAPS capsule Take 50,000 Units by mouth every Sunday.    Yes [provider]  Accu-Chek FastClix Lancets MISC USE AS DIRECTED TO TEST BLOOD SUGAR THREE TIMES DAILY Patient not taking: Reported on 11/27/2019 06/10/19   [name], MD  Blood Glucose Monitoring Suppl (ACCU-CHEK GUIDE) w/Device KIT 1 each by Does not apply route 3 (three) times daily. Patient not taking: Reported on 11/06/2019 06/10/19   [name], MD  Continuous Blood Gluc Receiver (DEXCOM G6 RECEIVER) DEVI Use 1 each as directed Patient not taking: Reported on 11/27/2019 11/22/19   [name], MD  Continuous Blood Gluc Sensor (DEXCOM G6 SENSOR)  MISC Use 1 each as directed Patient not taking: Reported on 11/27/2019 11/22/19   [name], MD  Continuous Blood Gluc Transmit (DEXCOM G6 TRANSMITTER) MISC Use 1 each as directed Patient not taking: Reported on 11/27/2019 11/22/19   [name], MD  EASY COMFORT PEN NEEDLES 31G X 5 MM MISC USE FOUR TIMES PER DAY FOR INSULIN ADMINISTRATION Patient not taking: Reported on 11/27/2019 10/02/18   [name], MD  glucose blood (ACCU-CHEK GUIDE) test strip USE AS DIRECTED TO TEST BLOOD SUGAR THREE TIMES DAILY Patient not taking: Reported on 11/27/2019 06/10/19   [name], MD  insulin aspart (NOVOLOG) 100 UNIT/ML injection 0 to 12 units subcutaneously 3 times daily before meals as per sliding scale Patient not taking: Reported on 11/27/2019 10/24/19   [name], MD  Insulin Glargine (BASAGLAR KWIKPEN) 100 UNIT/ML Inject 0.05 mLs (5 Units total) into the skin daily. Patient not taking: Reported on 11/27/2019 10/24/19   [name], MD  Lancet Device MISC Use as instructed 3 times daily Patient not taking: Reported on 11/27/2019 04/26/19   [name], MD    Past Medical History: Past Medical History:  Diagnosis Date   Acute CHF (congestive heart failure) ([place]) 09/24/2018   Acute on chronic combined systolic and diastolic CHF (congestive heart failure) ([place]) 11/27/2017   Acute on chronic respiratory failure with hypoxia ([place]) 03/20/2018   Acute renal failure superimposed on stage 3 chronic kidney disease (HCC)    Acute respiratory distress 11/27/2017   Acute respiratory failure with hypoxia ([place]) 85/11/8500   Acute systolic CHF (congestive heart failure) ([place]) 05/19/2019   AKI (acute kidney injury) ([place]) 11/27/2017   Atelectasis    Bulging lumbar disc 11/14/2017   CAP (community acquired pneumonia) 10/31/2018   Chest pain 06/17/2016   CHF (congestive heart failure) (HCC)    Chronic diastolic CHF (congestive heart failure) ([place]) 07/20/2018   Chronic respiratory  failure ([place])    CKD (chronic kidney disease), stage III 03/20/2018   Controlled type 2 diabetes mellitus with hyperglycemia ([place]) 04/23/2016   Controlled type 2 diabetes with neuropathy ([place]) 03/20/2018   COVID-19 virus infection 07/20/2018   CVA (cerebral vascular accident) ([place])    Diabetes mellitus without complication (HCC)    Dyspnea    Elevated troponin 04/18/2019   Epigastric pain    Gout 11/14/2017  Hypercholesteremia    Hyperlipidemia LDL goal <70 09/30/2016   Hypertension    Myocardial infarction (HCC)    Normochromic normocytic anemia 04/23/2016   NSTEMI (non-ST elevated myocardial infarction) ([place]) 02/09/2019   Pneumonia 11/01/2018   Pneumonia due to COVID-19 virus 07/20/2018   S/P thoracentesis    SIRS (systemic inflammatory response syndrome) ([place]) 07/25/2018   Spinal stenosis    Stress-induced cardiomyopathy 05/19/2016   Syncope 04/23/2016   Type 2 diabetes mellitus with diabetic neuropathy, unspecified ([place]) 05/06/2016   Vertigo 05/06/2016   Vitamin D deficiency 04/02/2018    Past Surgical History: Past Surgical History:  Procedure Laterality Date   BIOPSY  01/27/2019   Procedure: BIOPSY;  Surgeon: Otis Brace, MD;  Location: [facility];  Service: Gastroenterology;;   BLADDER SURGERY     CARDIAC CATHETERIZATION N/A 04/25/2016   Procedure: Left Heart Cath and Coronary Angiography;  Surgeon: Lorretta Harp, MD;  Location: [place] [facility];  Service: Cardiovascular;  Laterality: N/A;   CARDIAC CATHETERIZATION  02/11/2019   CESAREAN SECTION     CHOLECYSTECTOMY     ESOPHAGOGASTRODUODENOSCOPY (EGD) WITH PROPOFOL N/A 01/27/2019   Procedure: ESOPHAGOGASTRODUODENOSCOPY (EGD) WITH PROPOFOL;  Surgeon: Otis Brace, MD;  Location: [facility];  Service: Gastroenterology;  Laterality: N/A;   RIGHT HEART CATH N/A 09/30/2019   Procedure: RIGHT HEART CATH;  Surgeon: Jolaine Artist, MD;  Location: [place] [facility];  Service:  Cardiovascular;  Laterality: N/A;   RIGHT/LEFT HEART CATH AND CORONARY ANGIOGRAPHY N/A 02/11/2019   Procedure: RIGHT/LEFT HEART CATH AND CORONARY ANGIOGRAPHY;  Surgeon: Jolaine Artist, MD;  Location: [place] [facility];  Service: Cardiovascular;  Laterality: N/A;   VIDEO BRONCHOSCOPY N/A 02/01/2019   Procedure: VIDEO BRONCHOSCOPY WITHOUT FLUORO;  Surgeon: Candee Furbish, MD;  Location: [facility];  Service: Endoscopy;  Laterality: N/A;    Family History: Family History  Problem Relation Age of Onset   Diabetes Mellitus II Father    Stroke Father    Healthy Mother        She is 21 years old.     Social History: Social History   Socioeconomic History   Marital status: Widowed    Spouse name: Not on file   Number of children: Not on file   Years of education: Not on file   Highest education level: Not on file  Occupational History   Not on file  Tobacco Use   Smoking status: Never Smoker   Smokeless tobacco: Never Used  Vaping Use   Vaping Use: Never used  Substance and Sexual Activity   Alcohol use: No   Drug use: No   Sexual activity: Not Currently    Birth control/protection: None  Other Topics Concern   Not on file  Social History Narrative   Not on file   Social Determinants of Health   Financial Resource Strain:    Difficulty of Paying Living Expenses: Not on file  Food Insecurity: No Food Insecurity   Worried About [place] in the Last Year: Never true   [place] in the Last Year: Never true  Transportation Needs:    Lack of Transportation (Medical): Not on file   Lack of Transportation (Non-Medical): Not on file  Physical Activity:    Days of Exercise per Week: Not on file   Minutes of Exercise per Session: Not on file  Stress:    Feeling of Stress : Not on file  Social Connections:    Frequency  of Communication with Friends and Family: Not on file   Frequency of Social Gatherings with Friends and  Family: Not on file   Attends Religious Services: Not on file   Active Member of Clubs or Organizations: Not on file   Attends Archivist Meetings: Not on file   Marital Status: Not on file    Allergies:  Allergies  Allergen Reactions   Garlic Shortness Of Breath, Itching, Swelling and Other (See Comments)    "Raw garlic" = Hand itching and swelling   Isosorbide Other (See Comments)    Sneezing and runny nose   Latex Itching   Morphine And Related Itching and Other (See Comments)    Headache    Other Itching and Other (See Comments)    Reaction to newspaper ink- Headaches, also    Objective:    Vital Signs:   Temp:  [98.4 F (36.9 C)-98.9 F (37.2 C)] 98.9 F (37.2 C) (08/26 0900) Pulse Rate:  [82-142] 87 (08/26 1300) Resp:  [14-40] 15 (08/26 1300) BP: (136-173)/(67-133) 148/71 (08/26 1300) SpO2:  [96 %-100 %] 100 % (08/26 1300) FiO2 (%):  [40 %-60 %] 40 % (08/26 1224)    Weight change: There were no vitals filed for this visit.  Intake/Output:  No intake or output data in the 24 hours ending 11/28/19 1345    Physical Exam    General:  Chronically ill appearing. Requiring NRB  HEENT: normal Neck: supple. Thick neck, JVD not well visualized . Carotids 2+ bilat; no bruits. No lymphadenopathy or thyromegaly appreciated. Cor: PMI nondisplaced. Regular rate & rhythm. No rubs, gallops or murmurs. Lungs: decreased BS at the bases bilaterally, no wheezing  Abdomen: obese, soft,  + diffuse tenderness, nondistended. No hepatosplenomegaly. No bruits or masses. Good bowel sounds. Extremities: no cyanosis, clubbing, rash, edema Neuro: alert & orientedx3, cranial nerves grossly intact. moves all 4 extremities w/o difficulty. Affect pleasant   Telemetry   NSR 90s.   EKG    EKG 8/25, sinus tach 128 bpm   Labs   Basic Metabolic Panel: Recent Labs  Lab 11/23/19 0201 11/23/19 0201 11/24/19 0506 11/24/19 0506 11/25/19 0609 11/26/19 0851  11/27/19 1440 11/27/19 1615 11/27/19 1804  NA 139   < > 138  --  138 140 140 140  --   K 3.5   < > 3.5  --  3.5 3.4* 4.3 4.4  --   CL 105  --  105  --  103 107 106  --   --   CO2 24  --  22  --  22 24 21*  --   --   GLUCOSE 85  --  140*  --  81 92 128*  --   --   BUN 51*  --  46*  --  42* 38* 38*  --   --   CREATININE 1.93*  --  1.80*  --  1.72* 1.83* 1.91*  --   --   CALCIUM 9.4   < > 8.9   < > 9.1 9.1 9.3  --   --   MG  --   --   --   --   --   --   --   --  1.9   < > = values in this interval not displayed.    Liver Function Tests: Recent Labs  Lab 11/27/19 1440  AST 15  ALT 19  ALKPHOS 80  BILITOT 0.5  PROT 6.4*  ALBUMIN 2.9*  No results for input(s): LIPASE, AMYLASE in the last 168 hours. No results for input(s): AMMONIA in the last 168 hours.  CBC: Recent Labs  Lab 11/22/19 0430 11/22/19 0430 11/23/19 0201 11/24/19 0506 11/26/19 0851 11/27/19 1440 11/27/19 1615  WBC 4.6  --   --   --   --  7.6  --   NEUTROABS  --   --   --   --   --  6.6  --   HGB 7.8*   < > 8.1* 8.1* 7.8* 9.4* 9.9*  HCT 25.7*   < > 26.7* 26.9* 26.4* 31.8* 29.0*  MCV 92.8  --   --   --   --  96.1  --   PLT 284  --   --   --   --  311  --    < > = values in this interval not displayed.    Cardiac Enzymes: No results for input(s): CKTOTAL, CKMB, CKMBINDEX, TROPONINI in the last 168 hours.  BNP: BNP (last 3 results) Recent Labs    11/19/19 0510 11/23/19 1306 11/27/19 1440  BNP 863.1* 867.6* 1,337.9*    ProBNP (last 3 results) No results for input(s): PROBNP in the last 8760 hours.   CBG: Recent Labs  Lab 11/25/19 2051 11/26/19 0856 11/27/19 2159 11/28/19 0928 11/28/19 1230  GLUCAP 175* 92 128* 99 92    Coagulation Studies: Recent Labs    11/27/19 1440  LABPROT 15.0  INR 1.2     Imaging   DG Chest Port 1 View  Result Date: 11/27/2019 CLINICAL DATA:  Shortness of breath EXAM: PORTABLE CHEST 1 VIEW COMPARISON:  11/19/2019 FINDINGS: Persistent interstitial  prominence with patchy increased density and small bilateral pleural effusions with bibasilar atelectasis. Similar cardiomegaly. No pneumothorax. IMPRESSION: Similar appearance with probable mild pulmonary edema and small bilateral pleural effusions with bibasilar atelectasis. Electronically Signed   By: Macy Mis M.D.   On: 11/27/2019 15:04     Medications:     Current Medications:  allopurinol  200 mg Oral QHS   aspirin EC  81 mg Oral q AM   atorvastatin  80 mg Oral QHS   DULoxetine  30 mg Oral QHS   furosemide  80 mg Intravenous BID   furosemide  80 mg Intravenous Once   gabapentin  100 mg Oral BID   heparin injection (subcutaneous)  5,000 Units Subcutaneous Q8H   hydrALAZINE  12.5 mg Oral TID   insulin aspart  0-15 Units Subcutaneous TID WC   insulin aspart  0-5 Units Subcutaneous QHS   pantoprazole  20 mg Oral Daily   potassium chloride SA  20 mEq Oral Daily   sodium chloride flush  3 mL Intravenous Q12H    Infusions:  sodium chloride     cefTRIAXone (ROCEPHIN)  IV Stopped (11/27/19 1935)   metronidazole Stopped (11/28/19 1104)     Assessment/Plan   1. Stage D, Systolic Heart Failure w/ Acute Exacerbation  - NICM. ? Viral cardiomyopathy as she did have COVID PNA 07/2018 and EF had been normal prior to this - Echo in 2018 w/ EF 30-35% (LHC w/ normal cors and normal EF by LVG 50-55%) - Echo 11/2017 & 10/2018 LVEF 50-55% - Echo 01/2019 EF 40-45% - Echo 02/2019 EF 30-35%, G3DD. RV ok. [facility] 11/20 w/ mild CAD and normal CO - Echo 09/2019 EF 20-25%, G3DD (restrictive), RV normal.  - RHC 09/30/2019 with elevated filling pressures and low output HF, CI 1.4 (required milrinone) - cMRI  10/01/19 w/ LVEF 26%, RVEF 46%, no subendocardial delayed myocardial enhancement, ECV is 29%, and no evidence of cardiac amyloidosis. -PYP scan 10/02/2019: Equivocal for TTR amyloidosis but doubt TTR given MRI negative for amyloid - She has end-stage HF, NYHA IIIb-IV, and not a  candidate for advanced therapies due to poor compliance. GDMT limited by CKD. She has had multiple readmissions for a/c CHF requiring IV Lasix in the last 3 months.  Unfortunately, we have nothing else to offer her. Recommend palliative care/ hospice. I discussed this with her at bedside. Can continue IV Lasix for palliation.   2. Stage III CKD - SCr 1.9 c/w baseline, suspect cardiorenal syndrome   3. Hypertension - continue IV Lasix for diuresis - continue hydralazine (currenlty 12.5 tid), titrate if needed  4. Chronic Anemia - likely anemia of chronic disease 2/2 CKD  - hgb 9.2  5. Acute Diverticulitis - abx per primary team   6. Type II DM  - hgb A1c 6.1 - SSI  - management per primary team  7. Failure to Thrive - poorly functional status 2/2 Stage D CHF - mainly bedridden, dependent on family for basic ADLs, multiple readmits for CHF in the last 3 months w/ limited medical options   - recommend palliative care    Length of Stay: 1  Brittainy Ladoris Gene  11/28/2019, 1:45 PM  Advanced Heart Failure Team Pager [phone] (M-F; 7a - 4p)  Please contact [facility] for night-coverage after hours (4p -7a ) and weekends on [email or web address]  Patient seen with PA, agree with the above note.   Patient was readmitted with dyspnea, found to be volume overloaded on exam.   General: On bipap.  Neck: JVP difficult but appears to be elevated, no thyromegaly or thyroid nodule.  Lungs: Decreased at bases.  CV: Nondisplaced PMI.  Heart regular S1/S2, no S3/S4, no murmur.  No peripheral edema.  No carotid bruit.  Normal pedal pulses.  Abdomen: Soft, nontender, no hepatosplenomegaly, no distention.  Skin: Intact without lesions or rashes.  Neurologic: Alert and oriented x 3.  Psych: Normal affect. Extremities: No clubbing or cyanosis.  HEENT: Normal.   She has been evaluated by Dr. Haroldine Laws in the past, found to have low output HF. She has end-stage HF, not a candidate for advanced  therapies.  She has not been compliant with outpatient followup. She is bed-ridden and dependent on family for all ADLs. She is volume overloaded.  - I recommend continuing Lasix 80 mg IV bid as is ordered for palliation.  - Would involve palliative care service to discuss hospice/comfort care.  We do not have anything further to offer that would improve her situation.   Loralie Champagne 11/28/2019 6:00 PM

## 2019-11-29 ENCOUNTER — Telehealth: Payer: Self-pay | Admitting: Family Medicine

## 2019-11-29 DIAGNOSIS — Z7189 Other specified counseling: Secondary | ICD-10-CM

## 2019-11-29 DIAGNOSIS — Z789 Other specified health status: Secondary | ICD-10-CM

## 2019-11-29 DIAGNOSIS — Z66 Do not resuscitate: Secondary | ICD-10-CM

## 2019-11-29 DIAGNOSIS — R0602 Shortness of breath: Secondary | ICD-10-CM

## 2019-11-29 LAB — BASIC METABOLIC PANEL
Anion gap: 13 (ref 5–15)
BUN: 37 mg/dL — ABNORMAL HIGH (ref 8–23)
CO2: 20 mmol/L — ABNORMAL LOW (ref 22–32)
Calcium: 9.2 mg/dL (ref 8.9–10.3)
Chloride: 108 mmol/L (ref 98–111)
Creatinine, Ser: 1.99 mg/dL — ABNORMAL HIGH (ref 0.44–1.00)
GFR calc Af Amer: 30 mL/min — ABNORMAL LOW (ref >60)
GFR calc non Af Amer: 26 mL/min — ABNORMAL LOW (ref >60)
Glucose, Bld: 104 mg/dL — ABNORMAL HIGH (ref 70–99)
Potassium: 4.5 mmol/L (ref 3.5–5.1)
Sodium: 141 mmol/L (ref 135–145)

## 2019-11-29 LAB — GLUCOSE, CAPILLARY
Glucose-Capillary: 72 mg/dL (ref 70–99)
Glucose-Capillary: 91 mg/dL (ref 70–99)
Glucose-Capillary: 108 mg/dL — ABNORMAL HIGH (ref 70–99)
Glucose-Capillary: 189 mg/dL — ABNORMAL HIGH (ref 70–99)
Glucose-Capillary: 141 mg/dL — ABNORMAL HIGH (ref 70–99)

## 2019-11-29 LAB — MRSA PCR SCREENING: MRSA by PCR: NEGATIVE

## 2019-11-29 LAB — MAGNESIUM: Magnesium: 1.7 mg/dL (ref 1.7–2.4)

## 2019-11-29 MED ORDER — HYDRALAZINE HCL 25 MG PO TABS
25.0000 mg | ORAL_TABLET | Freq: Three times a day (TID) | ORAL | Status: DC
  Administered 2019-11-29 – 2019-12-02 (×9): 25 mg via ORAL
  Filled 2019-11-29 (×9): qty 1

## 2019-11-29 MED ORDER — MAGNESIUM SULFATE 2 GM/50ML IV SOLN
2.0000 g | Freq: Once | INTRAVENOUS | Status: AC
  Administered 2019-11-29: 2 g via INTRAVENOUS
  Filled 2019-11-29: qty 50

## 2019-11-29 NOTE — Progress Notes (Addendum)
Advanced Heart Failure Rounding Note  PCP-Cardiologist: Candee Furbish, MD   Subjective:    Admitted with volume overload. Started on IV lasix. I/O not accurate.   Weaned off Bipap to [place]ana 9 liters.   Denies SOB. Tearful.    Objective:   Weight Range:   There is no height or weight on file to calculate BMI.   Vital Signs:   Temp:  [98.2 F (36.8 C)-98.4 F (36.9 C)] 98.2 F (36.8 C) (08/27 0719) Pulse Rate:  [84-102] 102 (08/27 0719) Resp:  [14-31] 19 (08/27 0719) BP: (142-165)/(66-91) 156/77 (08/27 0719) SpO2:  [90 %-100 %] 90 % (08/27 0943) FiO2 (%):  [40 %-60 %] 60 % (08/27 0700)    Weight change: There were no vitals filed for this visit.  Intake/Output:   Intake/Output Summary (Last 24 hours) at 11/29/2019 1006 Last data filed at 11/29/2019 0700 Gross per 24 hour  Intake --  Output 500 ml  Net -500 ml      Physical Exam    General:  Appears chronically ill . No resp difficulty HEENT: Normal Neck: Supple. JVP 11-12  Carotids 2+ bilat; no bruits. No lymphadenopathy or thyromegaly appreciated. Cor: PMI nondisplaced. Tachy regular rate & rhythm. No rubs, or murmurs. +S3  Lungs: Clear Abdomen: Soft, nontender, nondistended. No hepatosplenomegaly. No bruits or masses. Good bowel sounds. Extremities: No cyanosis, clubbing, rash, edema Neuro: Alert & orientedx3, cranial nerves grossly intact. moves all 4 extremities w/o difficulty. Affect pleasant   Telemetry   Sinus Tach 100s  EKG    N/a   Labs    CBC Recent Labs    11/27/19 1440 11/27/19 1615  WBC 7.6  --   NEUTROABS 6.6  --   HGB 9.4* 9.9*  HCT 31.8* 29.0*  MCV 96.1  --   PLT 311  --    Basic Metabolic Panel Recent Labs    11/27/19 1440 11/27/19 1440 11/27/19 1615 11/27/19 1804 11/29/19 0111  NA 140   < > 140  --  141  K 4.3   < > 4.4  --  4.5  CL 106  --   --   --  108  CO2 21*  --   --   --  20*  GLUCOSE 128*  --   --   --  104*  BUN 38*  --   --   --  37*  CREATININE 1.91*   --   --   --  1.99*  CALCIUM 9.3  --   --   --  9.2  MG  --   --   --  1.9 1.7   < > = values in this interval not displayed.   Liver Function Tests Recent Labs    11/27/19 1440  AST 15  ALT 19  ALKPHOS 80  BILITOT 0.5  PROT 6.4*  ALBUMIN 2.9*   No results for input(s): LIPASE, AMYLASE in the last 72 hours. Cardiac Enzymes No results for input(s): CKTOTAL, CKMB, CKMBINDEX, TROPONINI in the last 72 hours.  BNP: BNP (last 3 results) Recent Labs    11/19/19 0510 11/23/19 1306 11/27/19 1440  BNP 863.1* 867.6* 1,337.9*    ProBNP (last 3 results) No results for input(s): PROBNP in the last 8760 hours.   D-Dimer No results for input(s): DDIMER in the last 72 hours. Hemoglobin A1C Recent Labs    11/27/19 1804  HGBA1C 6.1*   Fasting Lipid Panel No results for input(s): CHOL, HDL, LDLCALC, TRIG, CHOLHDL,  LDLDIRECT in the last 72 hours. Thyroid Function Tests No results for input(s): TSH, T4TOTAL, T3FREE, THYROIDAB in the last 72 hours.  Invalid input(s): FREET3  Other results:   Imaging    DG Abd 1 View  Result Date: 11/28/2019 CLINICAL DATA:  Abdominal pain. EXAM: ABDOMEN - 1 VIEW COMPARISON:  CT 11/19/2019.  Radiograph 11/20/2019 FINDINGS: No evidence of free intra-abdominal air on supine views. Cholecystectomy clips in the right upper quadrant. Air within mildly prominent stomach. No evidence of obstruction. There is occasional air throughout the colon. Paucity of small bowel gas. No significant formed stool. Few phleboliths in the pelvis. IMPRESSION: Nonobstructive bowel gas pattern. Decreased stool burden from prior exam. Electronically Signed   By: Keith Rake M.D.   On: 11/28/2019 22:50      Medications:     Scheduled Medications: . allopurinol  200 mg Oral QHS  . aspirin EC  81 mg Oral q AM  . atorvastatin  80 mg Oral QHS  . DULoxetine  30 mg Oral QHS  . furosemide  80 mg Intravenous BID  . furosemide  80 mg Intravenous Once  . gabapentin   100 mg Oral BID  . heparin injection (subcutaneous)  5,000 Units Subcutaneous Q8H  . hydrALAZINE  12.5 mg Oral TID  . insulin aspart  0-15 Units Subcutaneous TID WC  . insulin aspart  0-5 Units Subcutaneous QHS  . pantoprazole  20 mg Oral Daily  . potassium chloride SA  20 mEq Oral Daily  . sodium chloride flush  3 mL Intravenous Q12H     Infusions: . sodium chloride    . cefTRIAXone (ROCEPHIN)  IV Stopped (11/28/19 2136)  . metronidazole 500 mg (11/29/19 0918)     PRN Medications:  sodium chloride, acetaminophen, fentaNYL (SUBLIMAZE) injection, nitroGLYCERIN, ondansetron (ZOFRAN) IV, polyethylene glycol, sodium chloride flush     Assessment/Plan   1. Stage D, Systolic Heart Failure w/ Acute Exacerbation  - NICM. ? Viral cardiomyopathy as she did have COVID PNA 07/2018 and EF had been normal prior to this - Echo in 2018 w/ EF 30-35% (LHC w/ normal cors and normal EF by LVG 50-55%) - Echo 11/2017 & 10/2018 LVEF 50-55% - Echo 01/2019 EF 40-45% - Echo 02/2019 EF 30-35%, G3DD. RV ok. [facility] 11/20 w/ mild CAD and normal CO - Echo 09/2019 EF 20-25%, G3DD (restrictive), RV normal.  - RHC 09/30/2019 with elevated filling pressures and low output HF, CI 1.4 (required milrinone) - cMRI 10/01/19 w/ LVEF 26%, RVEF 46%, no subendocardial delayed myocardial enhancement, ECV is 29%, and no evidence of cardiac amyloidosis. -PYP scan 10/02/2019: Equivocal for TTR amyloidosis but doubt TTR given MRI negative for amyloid - She has end-stage HF, NYHA IIIb-IV, and not a candidate for advanced therapies due to poor compliance. GDMT limited by CKD. She has had multiple readmissions for a/c CHF requiring IV Lasix in the last 3 months.  Unfortunately, we have nothing else to offer her. - Volume status elevated. Continue IV lasix. No plan for inotropes.  - Palliative Care consulted. Would recommend Hospice.  Recommend palliative care/ hospice.   2. Stage III CKD - SCr 1.9 c/w baseline, suspect cardiorenal  syndrome  SCr unchanged at 1.9   3. Hypertension - continue IV Lasix for diuresis - Increase hydralazine 25 three times a day.   4. Chronic Anemia - likely anemia of chronic disease 2/2 CKD  - hgb 9.2  5. Acute Diverticulitis - abx per primary team   6. Type II DM  -  hgb A1c 6.1 - SSI  - management per primary team  7. Failure to Thrive - poorly functional status 2/2 Stage D CHF - mainly bedridden, dependent on family for basic ADLs, multiple readmits for CHF in the last 3 months w/ limited medical options   - recommend palliative care   Discussed plan for IV diuretics to further decongest. Family meeting today. Length of Stay: 2  Darrick Grinder, NP  11/29/2019, 10:06 AM  Advanced Heart Failure Team Pager [phone] (M-F; 7a - 4p)  Please contact [facility] for night-coverage after hours (4p -7a ) and weekends on [email or web address]  Patient seen and examined with the above-signed Advanced Practice Provider and/or Housestaff. I personally reviewed laboratory data, imaging studies and relevant notes. I independently examined the patient and formulated the important aspects of the plan. I have edited the note to reflect any of my changes or salient points. I have personally discussed the plan with the patient and/or family.   She continues to struggle with low output HF and volume overload. Suspect ab pain related to volume overload and low output. Not responding well to IV lasix  General:  Ill appearing. No resp difficulty HEENT: normal Neck: supple. JVP to jaw Carotids 2+ bilat; no bruits. No lymphadenopathy or thryomegaly appreciated. Cor: PMI nondisplaced. Regular tachy Lungs: clear Abdomen: soft, nontender, + distended. No hepatosplenomegaly. No bruits or masses. Good bowel sounds. Extremities: no cyanosis, clubbing, rash, edema Neuro: alert & orientedx3, cranial nerves grossly intact. moves all 4 extremities w/o difficulty. Affect pleasant  She unfortunately has end-stage  systolic HF with no durable options. Would continue IV diuresis. Hospice is only real option to prevent suffering here.   The HF team will sign off.   Glori Bickers, MD  11:48 AM

## 2019-11-29 NOTE — Progress Notes (Signed)
PROGRESS NOTE    Kara Hanson  YPP:[id number] DOB: April 20, 1955 DOA: 11/27/2019 PCP: [name], MD     Brief Narrative:  Kara Hanson is a 64 y.o. female with medical history significant of hypertension, systolic and diastolic congestive heart failure(last EF20-25%), insulin-dependent diabetes mellitus, chronic hypoxic respiratory failure on 4 L of nasal cannula oxygen, pneumonia due to COVID-19, chronic kidney disease stage IV presented to hospital with complaint of shortness of breath.  Patient was admitted on 11/19/2019 and discharged on 11/26/2019.  She was hospitalized for acute on chronic hypoxic respiratory failure secondary to acute on chronic combined systolic and diastolic heart failure as well as acute diverticulitis.  According to patient, she was feeling fine when she was discharged from hospital but upon arrival to home, she started feeling shortness of breath. Reportedly, when EMS arrived, she was found to be saturating 70% while on 4 L nasal oxygen which is her baseline oxygen use.  She was brought into the emergency department and was on nonrebreather. Chest x-ray showed pulmonary edema. She was given IV lasix.  Cardiology as well as palliative care team were consulted.  New events last 24 hours / Subjective: Patient remains on BiPAP this morning.  States that her abdominal pain has improved since last night.  We discussed her end-stage heart failure, heart failure team recommendation for palliative care/hospice.  She has a palliative care family meeting later this morning.  Assessment & Plan:   Principal Problem:   Acute on chronic combined systolic and diastolic CHF (congestive heart failure) (HCC) Active Problems:   Hypertension   Type 2 diabetes mellitus with diabetic neuropathy, unspecified (HCC)   Hyperlipidemia LDL goal <70   Acute on chronic respiratory failure with hypoxia (HCC)   Elevated troponin    Acuteon chronicrespiratory failure with hypoxia  secondary to acute on chronic combined systolic and diastolicCHF exacerbation -At baseline, patient requires 4 L of nasal cannula oxygen.  Currently on BiPAP and wean as able -BNP 1337.9 -Chest x-ray revealed mild pulmonary edema, small bilateral pleural effusions -Continue IV Lasix -Strict I's and O's, daily weight -Cardiology following  Acute diverticulitis -She was diagnosed with acute diverticulitis during recent hospitalization.  She was discharged on oral Augmentin to complete 10 days course (last day 8/27).  Continue Rocephin/flagyl while in hospital -Abdominal pain improved since overnight  Demand ischemia -Elevated troponin likely secondary to heart failure exacerbation as well as respiratory failure.  Patient denies any chest pain  Diabetes mellitus type 2, with complication including peripheral neuropathy -Hemoglobin A1c 6.1 -Novolog SSI, Neurontin  Essential hypertension -Continue hydralazine, Lasix  Chronic kidney disease stage IV -Baseline Cr 1.8-1.9 -Stable  Hyperlipidemia -Continue statin  Depression -Continue Cymbalta    DVT prophylaxis:  heparin injection 5,000 Units Start: 11/27/19 2200  Code Status: Full Family Communication: No family at bedside Disposition Plan:   Status is: Inpatient  Remains inpatient appropriate because:Hemodynamically unstable, IV treatments appropriate due to intensity of illness or inability to take PO and Inpatient level of care appropriate due to severity of illness   Dispo: The patient is from: Home              Anticipated d/c is to: Home              Anticipated d/c date is: 3 days              Patient currently is not medically stable to d/c. Remains on BiPAP this morning, continue IV lasix for diureses, palliative care meeting  with family later this morning for goals of care discussion   Consultants:   Cardiology  Palliative care medicine  Procedures:   None   Antimicrobials:  Anti-infectives  (From admission, onward)   Start     Dose/Rate Route Frequency Ordered Stop   11/27/19 2200  amoxicillin-clavulanate (AUGMENTIN) 500-125 MG per tablet 500 mg  Status:  Discontinued        1 tablet Oral 2 times daily 11/27/19 1701 11/27/19 1709   11/27/19 1830  metroNIDAZOLE (FLAGYL) IVPB 500 mg        500 mg 100 mL/hr over 60 Minutes Intravenous Every 8 hours 11/27/19 1709 11/29/19 2359   11/27/19 1800  cefTRIAXone (ROCEPHIN) 2 g in sodium chloride 0.9 % 100 mL IVPB        2 g 200 mL/hr over 30 Minutes Intravenous Every 24 hours 11/27/19 1709 11/29/19 2359       Objective: Vitals:   11/29/19 0700 11/29/19 0719 11/29/19 0922 11/29/19 0943  BP:  (!) 156/77    Pulse: 97 (!) 102    Resp: 20 19    Temp:  98.2 F (36.8 C)    TempSrc:  Axillary    SpO2: 100% 100% 95% 90%    Intake/Output Summary (Last 24 hours) at 11/29/2019 1024 Last data filed at 11/29/2019 0700 Gross per 24 hour  Intake --  Output 500 ml  Net -500 ml   There were no vitals filed for this visit.  Examination: General exam: Appears calm and comfortable  Respiratory system: Remains on BiPAP, without acute distress Cardiovascular system: S1 & S2 heard, RRR.  Gastrointestinal system: Abdomen is nondistended, soft and nontender. Normal bowel sounds heard. Central nervous system: Alert and oriented. Non focal exam. Speech clear  Extremities: Symmetric in appearance bilaterally  Skin: No rashes, lesions or ulcers on exposed skin  Psychiatry: Judgement and insight appear stable. Mood & affect appropriate.    Data Reviewed: I have personally reviewed following labs and imaging studies  CBC: Recent Labs  Lab 11/23/19 0201 11/24/19 0506 11/26/19 0851 11/27/19 1440 11/27/19 1615  WBC  --   --   --  7.6  --   NEUTROABS  --   --   --  6.6  --   HGB 8.1* 8.1* 7.8* 9.4* 9.9*  HCT 26.7* 26.9* 26.4* 31.8* 29.0*  MCV  --   --   --  96.1  --   PLT  --   --   --  311  --    Basic Metabolic Panel: Recent Labs  Lab  11/24/19 0506 11/24/19 0506 11/25/19 0609 11/26/19 0851 11/27/19 1440 11/27/19 1615 11/27/19 1804 11/29/19 0111  NA 138   < > 138 140 140 140  --  141  K 3.5   < > 3.5 3.4* 4.3 4.4  --  4.5  CL 105  --  103 107 106  --   --  108  CO2 22  --  22 24 21*  --   --  20*  GLUCOSE 140*  --  81 92 128*  --   --  104*  BUN 46*  --  42* 38* 38*  --   --  37*  CREATININE 1.80*  --  1.72* 1.83* 1.91*  --   --  1.99*  CALCIUM 8.9  --  9.1 9.1 9.3  --   --  9.2  MG  --   --   --   --   --   --  1.9 1.7   < > = values in this interval not displayed.   GFR: Estimated Creatinine Clearance: 23.9 mL/min (A) (by C-G formula based on SCr of 1.99 mg/dL (H)). Liver Function Tests: Recent Labs  Lab 11/27/19 1440  AST 15  ALT 19  ALKPHOS 80  BILITOT 0.5  PROT 6.4*  ALBUMIN 2.9*   No results for input(s): LIPASE, AMYLASE in the last 168 hours. No results for input(s): AMMONIA in the last 168 hours. Coagulation Profile: Recent Labs  Lab 11/27/19 1440  INR 1.2   Cardiac Enzymes: No results for input(s): CKTOTAL, CKMB, CKMBINDEX, TROPONINI in the last 168 hours. BNP (last 3 results) No results for input(s): PROBNP in the last 8760 hours. HbA1C: Recent Labs    11/27/19 1804  HGBA1C 6.1*   CBG: Recent Labs  Lab 11/28/19 1813 11/28/19 2014 11/28/19 2324 11/29/19 0420 11/29/19 0720  GLUCAP 85 74 112* 72 91   Lipid Profile: No results for input(s): CHOL, HDL, LDLCALC, TRIG, CHOLHDL, LDLDIRECT in the last 72 hours. Thyroid Function Tests: No results for input(s): TSH, T4TOTAL, FREET4, T3FREE, THYROIDAB in the last 72 hours. Anemia Panel: No results for input(s): VITAMINB12, FOLATE, FERRITIN, TIBC, IRON, RETICCTPCT in the last 72 hours. Sepsis Labs: Recent Labs  Lab 11/23/19 1306  LATICACIDVEN 0.5    Recent Results (from the past 240 hour(s))  C Difficile Quick Screen w PCR reflex     Status: Abnormal   Collection Time: 11/21/19  1:36 PM   Specimen: STOOL  Result Value Ref  Range Status   C Diff antigen POSITIVE (A) NEGATIVE Final   C Diff toxin NEGATIVE NEGATIVE Final   C Diff interpretation Results are indeterminate. See PCR results.  Final    Comment: Performed at [facility], [place] [address] [place], [place] [address]  C. Diff by PCR, Reflexed     Status: None   Collection Time: 11/21/19  1:36 PM  Result Value Ref Range Status   Toxigenic C. Difficile by PCR NEGATIVE NEGATIVE Final    Comment: Patient is colonized with non toxigenic C. difficile. May not need treatment unless significant symptoms are present. Performed at [facility], [place] [address] [place], [place] [address]   SARS Coronavirus 2 by RT PCR (hospital order, performed in [facility] hospital lab) Nasopharyngeal Nasopharyngeal Swab     Status: None   Collection Time: 11/27/19  7:06 PM   Specimen: Nasopharyngeal Swab  Result Value Ref Range Status   SARS Coronavirus 2 NEGATIVE NEGATIVE Final    Comment: (NOTE) SARS-CoV-2 target nucleic acids are NOT DETECTED.  The SARS-CoV-2 RNA is generally detectable in upper and lower respiratory specimens during the acute phase of infection. The lowest concentration of SARS-CoV-2 viral copies this assay can detect is 250 copies / mL. A negative result does not preclude SARS-CoV-2 infection and should not be used as the sole basis for treatment or other patient management decisions.  A negative result may occur with improper specimen collection / handling, submission of specimen other than nasopharyngeal swab, presence of viral mutation(s) within the areas targeted by this assay, and inadequate number of viral copies (<250 copies / mL). A negative result must be combined with clinical observations, patient history, and epidemiological information.  Fact Sheet for Patients:   StrictlyIdeas.no  Fact Sheet for Healthcare Providers: BankingDealers.co.za  This test is not yet approved or   cleared by the [place] FDA and has been authorized for detection and/or diagnosis of  SARS-CoV-2 by FDA under an Emergency Use Authorization (EUA).  This EUA will remain in effect (meaning this test can be used) for the duration of the COVID-19 declaration under Section 564(b)(1) of the Act, 21 U.S.C. section 360bbb-3(b)(1), unless the authorization is terminated or revoked sooner.  Performed at [facility], [place] [address], [place], [place] [address]   MRSA PCR Screening     Status: None   Collection Time: 11/29/19  7:40 AM   Specimen: Nasopharyngeal  Result Value Ref Range Status   MRSA by PCR NEGATIVE NEGATIVE Final    Comment:        The GeneXpert MRSA Assay (FDA approved for NASAL specimens only), is one component of a comprehensive MRSA colonization surveillance program. It is not intended to diagnose MRSA infection nor to guide or monitor treatment for MRSA infections. Performed at [facility], [place] [address], [place], [place] [address]       Radiology Studies: DG Abd 1 View  Result Date: 11/28/2019 CLINICAL DATA:  Abdominal pain. EXAM: ABDOMEN - 1 VIEW COMPARISON:  CT 11/19/2019.  Radiograph 11/20/2019 FINDINGS: No evidence of free intra-abdominal air on supine views. Cholecystectomy clips in the right upper quadrant. Air within mildly prominent stomach. No evidence of obstruction. There is occasional air throughout the colon. Paucity of small bowel gas. No significant formed stool. Few phleboliths in the pelvis. IMPRESSION: Nonobstructive bowel gas pattern. Decreased stool burden from prior exam. Electronically Signed   By: Keith Rake M.D.   On: 11/28/2019 22:50   DG Chest Port 1 View  Result Date: 11/27/2019 CLINICAL DATA:  Shortness of breath EXAM: PORTABLE CHEST 1 VIEW COMPARISON:  11/19/2019 FINDINGS: Persistent interstitial prominence with patchy increased density and small bilateral pleural effusions with bibasilar atelectasis. Similar  cardiomegaly. No pneumothorax. IMPRESSION: Similar appearance with probable mild pulmonary edema and small bilateral pleural effusions with bibasilar atelectasis. Electronically Signed   By: Macy Mis M.D.   On: 11/27/2019 15:04      Scheduled Meds: . allopurinol  200 mg Oral QHS  . aspirin EC  81 mg Oral q AM  . atorvastatin  80 mg Oral QHS  . DULoxetine  30 mg Oral QHS  . furosemide  80 mg Intravenous BID  . gabapentin  100 mg Oral BID  . heparin injection (subcutaneous)  5,000 Units Subcutaneous Q8H  . hydrALAZINE  12.5 mg Oral TID  . insulin aspart  0-15 Units Subcutaneous TID WC  . insulin aspart  0-5 Units Subcutaneous QHS  . pantoprazole  20 mg Oral Daily  . potassium chloride SA  20 mEq Oral Daily  . sodium chloride flush  3 mL Intravenous Q12H   Continuous Infusions: . sodium chloride    . cefTRIAXone (ROCEPHIN)  IV Stopped (11/28/19 2136)  . magnesium sulfate bolus IVPB    . metronidazole 500 mg (11/29/19 0918)     LOS: 2 days      Time spent: 35 minutes   Dessa Phi, DO [facility] 11/29/2019, 10:24 AM   Available via Epic secure chat 7am-7pm After these hours, please refer to coverage provider listed on [email or web address]

## 2019-11-29 NOTE — Progress Notes (Signed)
Manufacturing engineer Documentation  Liaison received referral for pt to dc home with hospice services.   Writer called pt's daughter, [name], to confirm interest and explain services. [name] stated that pt does have an old hospital bed at home but needs a new one along with a bedside commode. [facility] will order DME.   Pt's chart was reviewed and pt was approved for hospice services.   Liaison will continue to follow pt during hospital stay in order to provide admission visit upon dc.   Please do not hesitate to outreach with any questions and thank you for the referral.   Freddie Breech, RN  [facility] Liaison [phone]

## 2019-11-29 NOTE — Consult Note (Signed)
   [facility] Inpatient Consult   11/29/2019  Justa Hatchell 12-02-1955 [id number]   Patient is currently active with [facility] for chronic disease management services just recently.  Patient has been engaged by a [facility] RN with daughter.  Our community based plan of care has focused on disease management and community resource support.    Chart review reveals patient is being recommended for Hospice services.  Plan: Will follow with Inpatient Transition Of Care [TOC] team member for disposition and needs.  If patient transitions to hospice care will notify [facility] RN CM and Hospice services is where patient's needs will be met.  Will follow til transition.  . Of note, [facility] Care Management services does not replace or interfere with any services that are needed or arranged by inpatient [facility] care management team.  For additional questions or referrals please contact:  Natividad Brood, RN BSN [facility] Liaison  [phone] business mobile phone Toll free office [phone]  Fax number: [phone] [email or web address]

## 2019-11-29 NOTE — Progress Notes (Signed)
DNR

## 2019-11-29 NOTE — Telephone Encounter (Signed)
[facility] called and is requesting to know if PCP will be the attending physician for pt while she is in hospice. Please advise.    Callback # [phone]

## 2019-11-29 NOTE — Telephone Encounter (Signed)
Will route to PCP for review. 

## 2019-11-29 NOTE — Progress Notes (Addendum)
Daily Progress Note   Patient Name: Kara Hanson       Date: 11/29/2019 DOB: 12-16-1955  Age: 64 y.o. MRN#: [id number] Attending Physician: Dessa Phi, DO Primary Care Physician: [name], MD Admit Date: 11/27/2019  Reason for Consultation/Follow-up: Establishing goals of care  Subjective: I have reviewed medical records including EPIC notes, labs, and imaging. Received report from primary RN - RN had no acute concerns.  Went to visit patient at bedside - daughter/Kara Hanson was present. Patient was lying in bed awake, alert, oriented, and able to participate in conversation. Patient was on 9L O2 [place]. No signs or non-verbal gestures of pain or discomfort noted. No respiratory distress noted. Patient denied pain but did state she experienced shortness of breath at times while talking.  Met with patient and daughter/Kara Hanson at bedside to discuss diagnosis, prognosis, GOC, EOL wishes, disposition, and options.  I introduced Palliative Medicine as specialized medical care for people living with serious illness. It focuses on providing relief from the symptoms and stress of a serious illness. The goal is to improve quality of life for both the patient and the family.  We discussed patient's current illness and what it means in the larger context of patient's on-going co-morbidities.  Natural disease trajectory for end stage heart failure and expectations at EOL were discussed. The patient and Kara Hanson had a clear understanding of the patient's current medical condition and reviewed that cardiology does not feel she is a candidate for any advanced therapies/do not have anything else to offer her medically.  I attempted to elicit values and goals of care important to the patient. The difference  between aggressive medical intervention and comfort care was considered in light of the patient's goals of care. The patient stated that she wanted to go home. Hospice philosophy/services were explained and discussed in detail. The patient and her daughter were both agreeable to home hospice. The patient lived with Kara Hanson before hospitalization and Kara Hanson states she is able to provide the needed support for patient at home on discharge with hospice.  Advance directives, concepts specific to code status, and rehospitalization were considered and discussed. Encouraged patient to consider DNR/DNI status understanding evidenced based poor outcomes in similar hospitalized patient, as the cause of arrest is likely associated with advanced chronic illness rather than an easily reversible acute cardio-pulmonary  event. Patient was agreeable to DNR/DNI with understanding that she would not receive CPR, defibrillation, ACLS medications, or intubation. Patient stated that she does not have any advanced directives but was interested in naming her daughter Kara Hanson her HCPOA.  Patient is of [employer] faith and worked as a Environmental education officer for "many years." Marketing executive for emotional support, prayer, and HCPOA completion - patient was very Patent attorney and agreed to chaplain visit.  Discussed with patient/family the importance of continued conversation with each other and the medical providers regarding overall plan of care and treatment options, ensuring decisions are within the context of the patient's values and GOCs.    Emotional support and therapeutic listening was provided throughout visit.  Questions and concerns were addressed. The family was encouraged to call with questions or concerns. PMT card was provided.   Length of Stay: 2  Current Medications: Scheduled Meds:  . allopurinol  200 mg Oral QHS  . aspirin EC  81 mg Oral q AM  . atorvastatin  80 mg Oral QHS  . DULoxetine  30 mg Oral QHS  .  furosemide  80 mg Intravenous BID  . gabapentin  100 mg Oral BID  . heparin injection (subcutaneous)  5,000 Units Subcutaneous Q8H  . hydrALAZINE  12.5 mg Oral TID  . insulin aspart  0-15 Units Subcutaneous TID WC  . insulin aspart  0-5 Units Subcutaneous QHS  . pantoprazole  20 mg Oral Daily  . potassium chloride SA  20 mEq Oral Daily  . sodium chloride flush  3 mL Intravenous Q12H    Continuous Infusions: . sodium chloride    . cefTRIAXone (ROCEPHIN)  IV Stopped (11/28/19 2136)  . magnesium sulfate bolus IVPB    . metronidazole 500 mg (11/29/19 0918)    PRN Meds: sodium chloride, acetaminophen, fentaNYL (SUBLIMAZE) injection, nitroGLYCERIN, ondansetron (ZOFRAN) IV, polyethylene glycol, sodium chloride flush  Physical Exam Vitals and nursing note reviewed.  Constitutional:      General: She is not in acute distress.    Appearance: She is ill-appearing.  Pulmonary:     Effort: No respiratory distress.  Skin:    General: Skin is warm and dry.  Neurological:     Mental Status: She is alert.     Motor: Weakness present.  Psychiatric:        Attention and Perception: Attention normal.        Behavior: Behavior is cooperative.        Cognition and Memory: Cognition normal.     Comments: Tearful             Vital Signs: BP (!) 156/77 (BP Location: Right Arm)   Pulse (!) 102   Temp 98.2 F (36.8 C) (Axillary)   Resp 19   SpO2 90%  SpO2: SpO2: 90 % O2 Device: O2 Device: Nasal Cannula O2 Flow Rate: O2 Flow Rate (L/min): 9 L/min  Intake/output summary:   Intake/Output Summary (Last 24 hours) at 11/29/2019 1030 Last data filed at 11/29/2019 0700 Gross per 24 hour  Intake --  Output 500 ml  Net -500 ml   LBM:   Baseline Weight:   Most recent weight:         Palliative Assessment/Data: PPS 40%      Patient Active Problem List   Diagnosis Date Noted  . Abdominal pain 11/19/2019  . CKD (chronic kidney disease), stage IV ([place]) 11/19/2019  . Palliative care by  specialist   . Goals of care, counseling/discussion   . Cardiorenal  syndrome   . Hyponatremia 09/26/2019  . CHF exacerbation ([place]) 09/26/2019  . Acute on chronic systolic CHF (congestive heart failure) ([place]) 05/19/2019  . Elevated troponin 04/18/2019  . NSTEMI (non-ST elevated myocardial infarction) ([place]) 02/09/2019  . S/P thoracentesis   . Atelectasis   . Chronic respiratory failure ([place])   . CAP (community acquired pneumonia) 10/31/2018  . COVID-19 virus infection 07/20/2018  . Pneumonia due to COVID-19 virus 07/20/2018  . Vitamin D deficiency 04/02/2018  . Spinal stenosis 03/29/2018  . Acute on chronic respiratory failure with hypoxia ([place]) 03/20/2018  . Controlled type 2 diabetes with neuropathy ([place]) 03/20/2018  . Acute on chronic combined systolic and diastolic CHF (congestive heart failure) ([place]) 11/27/2017  . AKI (acute kidney injury) ([place]) 11/27/2017  . Acute respiratory distress 11/27/2017  . Bulging lumbar disc 11/14/2017  . Gout 11/14/2017  . Hyperlipidemia LDL goal <70 09/30/2016  . Chest pain 06/17/2016  . Stress-induced cardiomyopathy 05/19/2016  . Type 2 diabetes mellitus with diabetic neuropathy, unspecified ([place]) 05/06/2016  . Vertigo 05/06/2016  . Controlled type 2 diabetes mellitus with hyperglycemia ([place]) 04/23/2016  . Hypertension 04/23/2016  . Normochromic normocytic anemia 04/23/2016  . Syncope 04/23/2016    Palliative Care Assessment & Plan   Patient Profile: 64 y.o. female  with past medical history of chronic systolic and diastolic CHF (EF 53-97% on echo 09/2019), hypertension, insulin-dependent DM, chronic hypoxic respiratory failure on 4L of oxygen home, pneumonia due to COVID-19 (in 2020), and chronic kidney disease stage IV. She presented to the ED on 11/27/2019 with shortness of breath. In the ED, patient was requiring NRB mask. Chest x-ray showed pulmonary edema and she was treated with IV lasix. Patient has had multiple admissions in the last year  for acute CHF. She was hospitalized 6/21-7/21 for acute CHF requiring milrinone. She was readmitted 8/17 for acute CHF and did not require milrinone.  Palliative care has been consulted to assist with goals of care.  Assessment: Acute on chronic combined systolic and diastolic CHF exacerbation Hypertension Type 2 diabetes mellitus with neuropathy Hyperlipidemia Acute on chronic respiratory failure with hypoxia Acute diverticulitis [place] ischemia Chronic kidney disease stage IV Weakness Shortness of breath  Recommendations/Plan:  Continue current full scope treatment  DNR/DNI initiated - completed DNR form; electronic copy of DNR form placed in Media and Notes tab in Epic; hard copy placed on shadow chart  MOST form from April is available in [place] outlining other advanced directives  Patient is agreeable to home hospice support when medically stable for discharge - TOC consult placed. TOC and hospice liaison notified  Chaplain consult placed for emotional support and prayer request, as well as to help designate Kara Hanson/daughter HCPOA.   PMT will continue to follow holistically  Goals of Care and Additional Recommendations:  Limitations on Scope of Treatment: Full Scope Treatment  Code Status:    Code Status Orders  (From admission, onward)         Start     Ordered   11/27/19 1700  Full code  Continuous        11/27/19 1701        Code Status History    Date Active Date Inactive Code Status Order ID Comments User Context   11/19/2019 0830 11/26/2019 1901 Full Code [id number]  Norval Morton, MD ED   09/26/2019 1506 10/07/2019 2156 Full Code [id number]  Mckinley Jewel, MD ED   05/18/2019 1240 05/23/2019 0153 Full Code [id number]  Lequita Halt, MD ED  04/18/2019 0556 04/20/2019 2128 Full Code [id number]  Shela Leff, MD Inpatient   02/09/2019 0436 02/19/2019 2225 Full Code [id number]  Rise Patience, MD ED   01/23/2019 1757 02/08/2019 0133 Full Code [id number]   Georgette Shell, MD ED   11/01/2018 0029 11/05/2018 2014 Full Code [id number]  Sid Falcon, MD ED   09/24/2018 2303 10/02/2018 1600 Full Code [id number]  Elwyn Reach, MD Inpatient   07/20/2018 1655 07/26/2018 1800 Full Code [id number]  Debbe Odea, MD ED   03/20/2018 0402 03/24/2018 1949 Full Code [id number]  Norval Morton, MD ED   11/27/2017 0705 11/30/2017 1625 Full Code [id number]  Radene Gunning, NP ED   10/01/2016 0033 10/01/2016 1946 Full Code [id number]  Maryellen Pile, MD ED   06/17/2016 1720 06/18/2016 2022 Full Code [id number]  Reyne Dumas, MD ED   04/23/2016 0418 04/26/2016 1716 Full Code [id number]  Rise Patience, MD ED   Advance Care Planning Activity       Prognosis:   < 6 months  Discharge Planning:  Home with Hospice  Care plan was discussed with primary RN, Dr. Maylene Roes, [facility], hospice liaison, daughter, patient  Thank you for allowing the Palliative Medicine Team to assist in the care of this patient.   Total Time  65 minutes Prolonged Time Billed  yes       Greater than 50%  of this time was spent counseling and coordinating care related to the above assessment and plan.  Lin Landsman, NP  Please contact Palliative Medicine Team phone at [phone] for questions and concerns.

## 2019-11-29 NOTE — TOC Initial Note (Signed)
Transition of Care [facility]) - Initial/Assessment Note    Patient Details  Name: Kara Hanson MRN: [id number] Date of Birth: 1956/01/02  [facility] CM/SW Contact:    Joanne Chars, LCSW Phone Number: 11/29/2019, 2:07 PM  Clinical Narrative:     CSW met with pt and daughter [name] to discuss referral to Hospice.  Daughter was on the phone with [facility] when CSW entered and they would like to choose [facility].  Choice provided.  PCP in place.  Daughter asked about help with a POA--consult to chaplain already in place.               Expected Discharge Plan: Home w Hospice Care Barriers to Discharge: No Barriers Identified   Patient Goals and CMS Choice Patient states their goals for this hospitalization and ongoing recovery are:: hospice CMS Medicare.gov Compare Post Acute Care list provided to:: Patient Choice offered to / list presented to : Patient  Expected Discharge Plan and Services Expected Discharge Plan: [place] Acute Care Choice: Hospice ([facility]) Living arrangements for the past 2 months: [place]: Hospice and [place] Date [place]: 11/29/19 Time [place]: 26 Representative spoke with at [place]: Freddie Breech  Prior Living Arrangements/Services Living arrangements for the past 2 months: [place] Lives with:: Adult Children Patient language and need for interpreter reviewed:: Yes Do you feel safe going back to the place where you live?: Yes      Need for Family Participation in Patient Care: Yes (Comment) Care giver support system in place?: Yes (comment)   Criminal Activity/Legal Involvement Pertinent to Current Situation/Hospitalization: No - Comment as needed  Activities of Daily Living      Permission Sought/Granted Permission sought to share information with : Family Supports, Other (comment)  (hospice) Permission granted to share information with : Yes, Verbal Permission Granted  Share Information with NAME: [name], daughter  Permission granted to share info w AGENCY: [facility]        Emotional Assessment Appearance:: Appears older than stated age Attitude/Demeanor/Rapport: Engaged Affect (typically observed): Pleasant Orientation: : Oriented to Self, Oriented to Place, Oriented to  Time, Oriented to Situation Alcohol / Substance Use: Not Applicable Psych Involvement: No (comment)  Admission diagnosis:  Acute respiratory failure with hypoxia ([place]) [J96.01] Acute on chronic respiratory failure with hypoxia ([place]) [J96.21] Patient Active Problem List   Diagnosis Date Noted  . Abdominal pain 11/19/2019  . CKD (chronic kidney disease), stage IV ([place]) 11/19/2019  . Palliative care by specialist   . Goals of care, counseling/discussion   . Cardiorenal syndrome   . Hyponatremia 09/26/2019  . CHF exacerbation ([place]) 09/26/2019  . Acute on chronic systolic CHF (congestive heart failure) ([place]) 05/19/2019  . Elevated troponin 04/18/2019  . NSTEMI (non-ST elevated myocardial infarction) ([place]) 02/09/2019  . S/P thoracentesis   . Atelectasis   . Chronic respiratory failure ([place])   . CAP (community acquired pneumonia) 10/31/2018  . COVID-19 virus infection 07/20/2018  . Pneumonia due to COVID-19 virus 07/20/2018  . Vitamin D deficiency 04/02/2018  . Spinal stenosis 03/29/2018  . Acute on chronic respiratory failure with hypoxia ([place]) 03/20/2018  . Controlled type 2 diabetes with neuropathy ([place]) 03/20/2018  . Acute on chronic combined systolic and diastolic  CHF (congestive heart failure) ([place]) 11/27/2017  . AKI (acute kidney injury) ([place]) 11/27/2017  . Acute respiratory distress 11/27/2017  . Bulging lumbar disc 11/14/2017  . Gout 11/14/2017  . Hyperlipidemia LDL goal <70 09/30/2016  . Chest pain 06/17/2016  . Stress-induced cardiomyopathy 05/19/2016  . Type 2 diabetes  mellitus with diabetic neuropathy, unspecified ([place]) 05/06/2016  . Vertigo 05/06/2016  . Controlled type 2 diabetes mellitus with hyperglycemia ([place]) 04/23/2016  . Hypertension 04/23/2016  . Normochromic normocytic anemia 04/23/2016  . Syncope 04/23/2016   PCP:  [name], MD Pharmacy:   [place], [place] - [address] [place] [address] Phone: [phone] Fax: [phone]     Social Determinants of Health (SDOH) Interventions    Readmission Risk Interventions Readmission Risk Prevention Plan 10/03/2019 05/22/2019 02/11/2019  Transportation Screening Complete Complete Complete  PCP or Specialist Appt within 3-5 Days - - -  Not Complete comments - - -  HRI or [place] for [place] - - -  Medication Review Press photographer) Complete Complete Complete  PCP or Specialist appointment within 3-5 days of discharge - Complete Complete  HRI or [place] Complete Complete Complete  SW Recovery Care/Counseling Consult Complete Complete Complete  Palliative Care Screening Not Applicable Not Applicable Not [place] Not Applicable Not Applicable Not Applicable  Some recent data might be hidden

## 2019-11-29 NOTE — Progress Notes (Signed)
Chaplain visited with the patient as a result of a consult. The chaplain provided information regarding HCPOA. The daughter received the paperwork and would like to complete it before the patient is discharged. The chaplain also prayed with the family. The family asked that the chaplain visit with the patient again. The chaplain will follow-up on Monday if the patient is still present.  Brion Aliment Chaplain Resident For questions concerning this note please contact me by pager [phone]

## 2019-11-30 LAB — BASIC METABOLIC PANEL
Anion gap: 11 (ref 5–15)
BUN: 35 mg/dL — ABNORMAL HIGH (ref 8–23)
CO2: 22 mmol/L (ref 22–32)
Calcium: 9.5 mg/dL (ref 8.9–10.3)
Chloride: 107 mmol/L (ref 98–111)
Creatinine, Ser: 1.94 mg/dL — ABNORMAL HIGH (ref 0.44–1.00)
GFR calc Af Amer: 31 mL/min — ABNORMAL LOW (ref >60)
GFR calc non Af Amer: 27 mL/min — ABNORMAL LOW (ref >60)
Glucose, Bld: 105 mg/dL — ABNORMAL HIGH (ref 70–99)
Potassium: 4.3 mmol/L (ref 3.5–5.1)
Sodium: 140 mmol/L (ref 135–145)

## 2019-11-30 LAB — GLUCOSE, CAPILLARY
Glucose-Capillary: 112 mg/dL — ABNORMAL HIGH (ref 70–99)
Glucose-Capillary: 106 mg/dL — ABNORMAL HIGH (ref 70–99)
Glucose-Capillary: 151 mg/dL — ABNORMAL HIGH (ref 70–99)
Glucose-Capillary: 143 mg/dL — ABNORMAL HIGH (ref 70–99)

## 2019-11-30 NOTE — Progress Notes (Signed)
PROGRESS NOTE    Kara Hanson  OHY:[id number] DOB: 04/16/1955 DOA: 11/27/2019 PCP: [name], MD     Brief Narrative:  Kara Hanson is a 64 y.o. female with medical history significant of hypertension, systolic and diastolic congestive heart failure(last EF20-25%), insulin-dependent diabetes mellitus, chronic hypoxic respiratory failure on 4 L of nasal cannula oxygen, pneumonia due to COVID-19, chronic kidney disease stage IV presented to hospital with complaint of shortness of breath.  Patient was admitted on 11/19/2019 and discharged on 11/26/2019.  She was hospitalized for acute on chronic hypoxic respiratory failure secondary to acute on chronic combined systolic and diastolic heart failure as well as acute diverticulitis.  According to patient, she was feeling fine when she was discharged from hospital but upon arrival to home, she started feeling shortness of breath. Reportedly, when EMS arrived, she was found to be saturating 70% while on 4 L nasal oxygen which is her baseline oxygen use.  She was brought into the emergency department and was on nonrebreather. Chest x-ray showed pulmonary edema. She was given IV lasix.  Cardiology as well as palliative care team were consulted.  New events last 24 hours / Subjective: Weaned off BiPAP. Currently remains stable on HFNC 7L.  She states that she feels that her breathing is better.  Today her and family met with palliative care team, decision to go home with home hospice on discharge.  Assessment & Plan:   Principal Problem:   Acute on chronic combined systolic and diastolic CHF (congestive heart failure) (HCC) Active Problems:   Hypertension   Type 2 diabetes mellitus with diabetic neuropathy, unspecified (HCC)   Hyperlipidemia LDL goal <70   Acute on chronic respiratory failure with hypoxia (HCC)   Elevated troponin   DNR (do not resuscitate)   DNI (do not intubate)   Advanced directives, counseling/discussion   Shortness of  breath    Acuteon chronicrespiratory failure with hypoxia secondary to acute on chronic combined systolic and diastolicCHF exacerbation -At baseline, patient requires 4 L of nasal cannula oxygen -BNP 1337.9 -Chest x-ray revealed mild pulmonary edema, small bilateral pleural effusions -Continue IV Lasix -Strict I's and O's, daily weight -Cardiology has recommended palliative care/hospice -Currently on 7 L high flow nasal cannula, and weaned off BiPAP.  Plan to discharge home when oxygen levels are closer to her baseline  Acute diverticulitis -She was diagnosed with acute diverticulitis during recent hospitalization.  She was discharged on oral Augmentin to complete 10 days course (last day 8/27) -Improved  Demand ischemia  -Elevated troponin likely secondary to heart failure exacerbation as well as respiratory failure.  Patient denies any chest pain  Diabetes mellitus type 2, with complication including peripheral neuropathy -Hemoglobin A1c 6.1 -Novolog SSI, Neurontin  Essential hypertension -Continue hydralazine, Lasix  Chronic kidney disease stage IV -Baseline Cr 1.8-1.9 -Stable  Hyperlipidemia -Continue statin  Depression -Continue Cymbalta    DVT prophylaxis:  heparin injection 5,000 Units Start: 11/27/19 2200  Code Status: Full Family Communication: No family at bedside Disposition Plan:   Status is: Inpatient  Remains inpatient appropriate because:Hemodynamically unstable, IV treatments appropriate due to intensity of illness or inability to take PO and Inpatient level of care appropriate due to severity of illness   Dispo: The patient is from: Home              Anticipated d/c is to: Home              Anticipated d/c date is: 1 day  Patient currently is not medically stable to d/c.  Continue IV Lasix, continue to wean oxygen to baseline of 4 L.  Plan for home hospice on discharge   Consultants:   Cardiology  Palliative care  medicine  Procedures:   None   Antimicrobials:  Anti-infectives (From admission, onward)   Start     Dose/Rate Route Frequency Ordered Stop   11/27/19 2200  amoxicillin-clavulanate (AUGMENTIN) 500-125 MG per tablet 500 mg  Status:  Discontinued        1 tablet Oral 2 times daily 11/27/19 1701 11/27/19 1709   11/27/19 1830  metroNIDAZOLE (FLAGYL) IVPB 500 mg        500 mg 100 mL/hr over 60 Minutes Intravenous Every 8 hours 11/27/19 1709 11/29/19 1802   11/27/19 1800  cefTRIAXone (ROCEPHIN) 2 g in sodium chloride 0.9 % 100 mL IVPB        2 g 200 mL/hr over 30 Minutes Intravenous Every 24 hours 11/27/19 1709 11/29/19 1732       Objective: Vitals:   11/29/19 2120 11/30/19 0000 11/30/19 0403 11/30/19 0745  BP:  129/72 135/75 (!) 147/73  Pulse:  100 92   Resp: _0 Temp:  98.2 F (36.8 C) 98.2 F (36.8 C) 97.6 F (36.4 C)  TempSrc:  Oral Oral Oral  SpO2: 93% 98% 95%   Weight:   68.8 kg     Intake/Output Summary (Last 24 hours) at 11/30/2019 1024 Last data filed at 11/29/2019 1600 Gross per 24 hour  Intake 250 ml  Output --  Net 250 ml   Filed Weights   11/30/19 0403  Weight: 68.8 kg    Examination: General exam: Appears calm and comfortable  Respiratory system: Diminished breath sounds, respiratory effort normal without distress, on 7 L high flow nasal cannula O2 Cardiovascular system: S1 & S2 heard, RRR. No pedal edema. Gastrointestinal system: Abdomen is nondistended, soft and nontender. Normal bowel sounds heard. Central nervous system: Alert and oriented. Non focal exam. Speech clear  Extremities: Symmetric in appearance bilaterally  Skin: No rashes, lesions or ulcers on exposed skin  Psychiatry: Judgement and insight appear stable. Mood & affect appropriate.    Data Reviewed: I have personally reviewed following labs and imaging studies  CBC: Recent Labs  Lab 11/24/19 0506 11/26/19 0851 11/27/19 1440 11/27/19 1615  WBC  --   --  7.6  --    NEUTROABS  --   --  6.6  --   HGB 8.1* 7.8* 9.4* 9.9*  HCT 26.9* 26.4* 31.8* 29.0*  MCV  --   --  96.1  --   PLT  --   --  311  --    Basic Metabolic Panel: Recent Labs  Lab 11/25/19 0609 11/25/19 0609 11/26/19 0851 11/27/19 1440 11/27/19 1615 11/27/19 1804 11/29/19 0111 11/30/19 0057  NA 138   < > 140 140 140  --  141 140  K 3.5   < > 3.4* 4.3 4.4  --  4.5 4.3  CL 103  --  107 106  --   --  108 107  CO2 22  --  24 21*  --   --  20* 22  GLUCOSE 81  --  92 128*  --   --  104* 105*  BUN 42*  --  38* 38*  --   --  37* 35*  CREATININE 1.72*  --  1.83* 1.91*  --   --  1.99* 1.94*  CALCIUM 9.1  --  9.1 9.3  --   --  9.2 9.5  MG  --   --   --   --   --  1.9 1.7  --    < > = values in this interval not displayed.   GFR: Estimated Creatinine Clearance: 25.3 mL/min (A) (by C-G formula based on SCr of 1.94 mg/dL (H)). Liver Function Tests: Recent Labs  Lab 11/27/19 1440  AST 15  ALT 19  ALKPHOS 80  BILITOT 0.5  PROT 6.4*  ALBUMIN 2.9*   No results for input(s): LIPASE, AMYLASE in the last 168 hours. No results for input(s): AMMONIA in the last 168 hours. Coagulation Profile: Recent Labs  Lab 11/27/19 1440  INR 1.2   Cardiac Enzymes: No results for input(s): CKTOTAL, CKMB, CKMBINDEX, TROPONINI in the last 168 hours. BNP (last 3 results) No results for input(s): PROBNP in the last 8760 hours. HbA1C: Recent Labs    11/27/19 1804  HGBA1C 6.1*   CBG: Recent Labs  Lab 11/29/19 0720 11/29/19 1210 11/29/19 1641 11/29/19 2107 11/30/19 0744  GLUCAP 91 108* 189* 141* 112*   Lipid Profile: No results for input(s): CHOL, HDL, LDLCALC, TRIG, CHOLHDL, LDLDIRECT in the last 72 hours. Thyroid Function Tests: No results for input(s): TSH, T4TOTAL, FREET4, T3FREE, THYROIDAB in the last 72 hours. Anemia Panel: No results for input(s): VITAMINB12, FOLATE, FERRITIN, TIBC, IRON, RETICCTPCT in the last 72 hours. Sepsis Labs: Recent Labs  Lab 11/23/19 1306  LATICACIDVEN  0.5    Recent Results (from the past 240 hour(s))  C Difficile Quick Screen w PCR reflex     Status: Abnormal   Collection Time: 11/21/19  1:36 PM   Specimen: STOOL  Result Value Ref Range Status   C Diff antigen POSITIVE (A) NEGATIVE Final   C Diff toxin NEGATIVE NEGATIVE Final   C Diff interpretation Results are indeterminate. See PCR results.  Final    Comment: Performed at [facility], [place] [address], [place], [place] [address]  C. Diff by PCR, Reflexed     Status: None   Collection Time: 11/21/19  1:36 PM  Result Value Ref Range Status   Toxigenic C. Difficile by PCR NEGATIVE NEGATIVE Final    Comment: Patient is colonized with non toxigenic C. difficile. May not need treatment unless significant symptoms are present. Performed at [facility], [place] [address], [place], [place] [address]   SARS Coronavirus 2 by RT PCR (hospital order, performed in [facility] hospital lab) Nasopharyngeal Nasopharyngeal Swab     Status: None   Collection Time: 11/27/19  7:06 PM   Specimen: Nasopharyngeal Swab  Result Value Ref Range Status   SARS Coronavirus 2 NEGATIVE NEGATIVE Final    Comment: (NOTE) SARS-CoV-2 target nucleic acids are NOT DETECTED.  The SARS-CoV-2 RNA is generally detectable in upper and lower respiratory specimens during the acute phase of infection. The lowest concentration of SARS-CoV-2 viral copies this assay can detect is 250 copies / mL. A negative result does not preclude SARS-CoV-2 infection and should not be used as the sole basis for treatment or other patient management decisions.  A negative result may occur with improper specimen collection / handling, submission of specimen other than nasopharyngeal swab, presence of viral mutation(s) within the areas targeted by this assay, and inadequate number of viral copies (<250 copies / mL). A negative result must be combined with clinical observations, patient history, and epidemiological  information.  Fact Sheet for Patients:   StrictlyIdeas.no  Fact Sheet for Healthcare Providers: BankingDealers.co.za  This test is not yet approved or  cleared by the [place] FDA and has been authorized for detection and/or diagnosis of SARS-CoV-2 by FDA under an Emergency Use Authorization (EUA).  This EUA will remain in effect (meaning this test can be used) for the duration of the COVID-19 declaration under Section 564(b)(1) of the Act, 21 U.S.C. section 360bbb-3(b)(1), unless the authorization is terminated or revoked sooner.  Performed at [facility], [place] [address] [place], [place] [address]   MRSA PCR Screening     Status: None   Collection Time: 11/29/19  7:40 AM   Specimen: Nasopharyngeal  Result Value Ref Range Status   MRSA by PCR NEGATIVE NEGATIVE Final    Comment:        The GeneXpert MRSA Assay (FDA approved for NASAL specimens only), is one component of a comprehensive MRSA colonization surveillance program. It is not intended to diagnose MRSA infection nor to guide or monitor treatment for MRSA infections. Performed at [facility], [place] [address] [place], [place] [address]       Radiology Studies: DG Abd 1 View  Result Date: 11/28/2019 CLINICAL DATA:  Abdominal pain. EXAM: ABDOMEN - 1 VIEW COMPARISON:  CT 11/19/2019.  Radiograph 11/20/2019 FINDINGS: No evidence of free intra-abdominal air on supine views. Cholecystectomy clips in the right upper quadrant. Air within mildly prominent stomach. No evidence of obstruction. There is occasional air throughout the colon. Paucity of small bowel gas. No significant formed stool. Few phleboliths in the pelvis. IMPRESSION: Nonobstructive bowel gas pattern. Decreased stool burden from prior exam. Electronically Signed   By: Keith Rake M.D.   On: 11/28/2019 22:50      Scheduled Meds: . allopurinol  200 mg Oral QHS  . aspirin EC  81 mg Oral q  AM  . atorvastatin  80 mg Oral QHS  . DULoxetine  30 mg Oral QHS  . furosemide  80 mg Intravenous BID  . gabapentin  100 mg Oral BID  . heparin injection (subcutaneous)  5,000 Units Subcutaneous Q8H  . hydrALAZINE  25 mg Oral TID  . insulin aspart  0-15 Units Subcutaneous TID WC  . insulin aspart  0-5 Units Subcutaneous QHS  . pantoprazole  20 mg Oral Daily  . potassium chloride SA  20 mEq Oral Daily  . sodium chloride flush  3 mL Intravenous Q12H   Continuous Infusions: . sodium chloride       LOS: 3 days      Time spent: 25 minutes   Dessa Phi, DO [facility] 11/30/2019, 10:24 AM   Available via Epic secure chat 7am-7pm After these hours, please refer to coverage provider listed on [email or web address]

## 2019-12-01 LAB — GLUCOSE, CAPILLARY
Glucose-Capillary: 102 mg/dL — ABNORMAL HIGH (ref 70–99)
Glucose-Capillary: 105 mg/dL — ABNORMAL HIGH (ref 70–99)
Glucose-Capillary: 154 mg/dL — ABNORMAL HIGH (ref 70–99)
Glucose-Capillary: 148 mg/dL — ABNORMAL HIGH (ref 70–99)

## 2019-12-01 LAB — BASIC METABOLIC PANEL
Anion gap: 11 (ref 5–15)
BUN: 36 mg/dL — ABNORMAL HIGH (ref 8–23)
CO2: 22 mmol/L (ref 22–32)
Calcium: 9.6 mg/dL (ref 8.9–10.3)
Chloride: 106 mmol/L (ref 98–111)
Creatinine, Ser: 2.06 mg/dL — ABNORMAL HIGH (ref 0.44–1.00)
GFR calc Af Amer: 29 mL/min — ABNORMAL LOW (ref >60)
GFR calc non Af Amer: 25 mL/min — ABNORMAL LOW (ref >60)
Glucose, Bld: 133 mg/dL — ABNORMAL HIGH (ref 70–99)
Potassium: 4.5 mmol/L (ref 3.5–5.1)
Sodium: 139 mmol/L (ref 135–145)

## 2019-12-01 MED ORDER — CALCIUM CARBONATE ANTACID 500 MG PO CHEW
1.0000 | CHEWABLE_TABLET | Freq: Two times a day (BID) | ORAL | Status: DC | PRN
  Administered 2019-12-01: 200 mg via ORAL
  Filled 2019-12-01: qty 1

## 2019-12-01 NOTE — Progress Notes (Signed)
PROGRESS NOTE    Kara Hanson  ZHG:[id number] DOB: [birth year] DOA: 11/27/2019 PCP: [name], MD     Brief Narrative:  Kara Hanson is a 64 y.o. female with medical history significant of hypertension, systolic and diastolic congestive heart failure(last EF20-25%), insulin-dependent diabetes mellitus, chronic hypoxic respiratory failure on 4 L of nasal cannula oxygen, pneumonia due to COVID-19, chronic kidney disease stage IV presented to hospital with complaint of shortness of breath.  Patient was admitted on 11/19/2019 and discharged on 11/26/2019.  She was hospitalized for acute on chronic hypoxic respiratory failure secondary to acute on chronic combined systolic and diastolic heart failure as well as acute diverticulitis.  According to patient, she was feeling fine when she was discharged from hospital but upon arrival to home, she started feeling shortness of breath. Reportedly, when EMS arrived, she was found to be saturating 70% while on 4 L nasal oxygen which is her baseline oxygen use.  She was brought into the emergency department and was on nonrebreather. Chest x-ray showed pulmonary edema. She was given IV lasix.  Cardiology as well as palliative care team were consulted.  New events last 24 hours / Subjective: She states that her breathing feels much better.  Currently still remains on 7 L high flow nasal cannula O2.  States that her abdominal discomfort has improved as well  Assessment & Plan:   Principal Problem:   Acute on chronic combined systolic and diastolic CHF (congestive heart failure) (HCC) Active Problems:   Hypertension   Type 2 diabetes mellitus with diabetic neuropathy, unspecified (HCC)   Hyperlipidemia LDL goal <70   Acute on chronic respiratory failure with hypoxia (HCC)   Elevated troponin   DNR (do not resuscitate)   DNI (do not intubate)   Advanced directives, counseling/discussion   Shortness of breath    Acuteon chronicrespiratory  failure with hypoxia secondary to acute on chronic combined systolic and diastolicCHF exacerbation -At baseline, patient requires 4 L of nasal cannula oxygen -BNP 1337.9 -Chest x-ray revealed mild pulmonary edema, small bilateral pleural effusions -Continue IV Lasix -Strict I's and O's, daily weight -Cardiology has recommended palliative care/hospice -Currently on 7 L high flow nasal cannula, and weaned off BiPAP.  Plan to discharge home when oxygen levels are closer to her baseline  Acute diverticulitis -Completed 10 day antibiotic course  Demand ischemia  -Elevated troponin likely secondary to heart failure exacerbation as well as respiratory failure.  Patient denies any chest pain  Diabetes mellitus type 2, with complication including peripheral neuropathy -Hemoglobin A1c 6.1 -Novolog SSI, Neurontin  Essential hypertension -Continue hydralazine, Lasix  Chronic kidney disease stage IV -Baseline Cr 1.8-1.9 -Stable  Hyperlipidemia -Continue statin  Depression -Continue Cymbalta    DVT prophylaxis:  heparin injection 5,000 Units Start: 11/27/19 2200  Code Status: Full Family Communication: No family at bedside Disposition Plan:   Status is: Inpatient  Remains inpatient appropriate because:Inpatient level of care appropriate due to severity of illness   Dispo: The patient is from: Home              Anticipated d/c is to: Home              Anticipated d/c date is: 1 day              Patient currently is not medically stable to d/c.  Continue IV Lasix, continue to wean oxygen to baseline of 4 L.  Plan for home hospice on discharge   Consultants:   Cardiology  Palliative  care medicine  Procedures:   None   Antimicrobials:  Anti-infectives (From admission, onward)   Start     Dose/Rate Route Frequency Ordered Stop   11/27/19 2200  amoxicillin-clavulanate (AUGMENTIN) 500-125 MG per tablet 500 mg  Status:  Discontinued        1 tablet Oral 2 times  daily 11/27/19 1701 11/27/19 1709   11/27/19 1830  metroNIDAZOLE (FLAGYL) IVPB 500 mg        500 mg 100 mL/hr over 60 Minutes Intravenous Every 8 hours 11/27/19 1709 11/29/19 1802   11/27/19 1800  cefTRIAXone (ROCEPHIN) 2 g in sodium chloride 0.9 % 100 mL IVPB        2 g 200 mL/hr over 30 Minutes Intravenous Every 24 hours 11/27/19 1709 11/29/19 1732       Objective: Vitals:   11/30/19 1951 11/30/19 2300 12/01/19 0405 12/01/19 0736  BP: 121/72 (!) 122/93 122/81 132/73  Pulse: 100 99 96 95  Resp: 20 20 18 17   Temp: 98 F (36.7 C) 97.7 F (36.5 C) 97.9 F (36.6 C) 98.2 F (36.8 C)  TempSrc: Oral Oral Oral Oral  SpO2: 93% 99% 98% 100%  Weight:   67.3 kg     Intake/Output Summary (Last 24 hours) at 12/01/2019 1033 Last data filed at 11/30/2019 1600 Gross per 24 hour  Intake 386 ml  Output --  Net 386 ml   Filed Weights   11/30/19 0403 12/01/19 0405  Weight: 68.8 kg 67.3 kg    Examination: General exam: Appears calm and comfortable  Respiratory system: Diminished breath sounds bilaterally. Respiratory effort normal.  On 7 L high flow nasal cannula O2 Cardiovascular system: S1 & S2 heard, RRR. No pedal edema. Gastrointestinal system: Abdomen is nondistended, soft and nontender. Normal bowel sounds heard. Central nervous system: Alert and oriented. Non focal exam. Speech clear  Extremities: Symmetric in appearance bilaterally  Skin: No rashes, lesions or ulcers on exposed skin  Psychiatry: Judgement and insight appear stable. Mood & affect appropriate.    Data Reviewed: I have personally reviewed following labs and imaging studies  CBC: Recent Labs  Lab 11/26/19 0851 11/27/19 1440 11/27/19 1615  WBC  --  7.6  --   NEUTROABS  --  6.6  --   HGB 7.8* 9.4* 9.9*  HCT 26.4* 31.8* 29.0*  MCV  --  96.1  --   PLT  --  311  --    Basic Metabolic Panel: Recent Labs  Lab 11/26/19 0851 11/26/19 0851 11/27/19 1440 11/27/19 1615 11/27/19 1804 11/29/19 0111  11/30/19 0057 12/01/19 0202  NA 140   < > 140 140  --  141 140 139  K 3.4*   < > 4.3 4.4  --  4.5 4.3 4.5  CL 107  --  106  --   --  108 107 106  CO2 24  --  21*  --   --  20* 22 22  GLUCOSE 92  --  128*  --   --  104* 105* 133*  BUN 38*  --  38*  --   --  37* 35* 36*  CREATININE 1.83*  --  1.91*  --   --  1.99* 1.94* 2.06*  CALCIUM 9.1  --  9.3  --   --  9.2 9.5 9.6  MG  --   --   --   --  1.9 1.7  --   --    < > = values in this interval not  displayed.   GFR: Estimated Creatinine Clearance: 23.6 mL/min (A) (by C-G formula based on SCr of 2.06 mg/dL (H)). Liver Function Tests: Recent Labs  Lab 11/27/19 1440  AST 15  ALT 19  ALKPHOS 80  BILITOT 0.5  PROT 6.4*  ALBUMIN 2.9*   No results for input(s): LIPASE, AMYLASE in the last 168 hours. No results for input(s): AMMONIA in the last 168 hours. Coagulation Profile: Recent Labs  Lab 11/27/19 1440  INR 1.2   Cardiac Enzymes: No results for input(s): CKTOTAL, CKMB, CKMBINDEX, TROPONINI in the last 168 hours. BNP (last 3 results) No results for input(s): PROBNP in the last 8760 hours. HbA1C: No results for input(s): HGBA1C in the last 72 hours. CBG: Recent Labs  Lab 11/30/19 0744 11/30/19 1157 11/30/19 1642 11/30/19 2111 12/01/19 0737  GLUCAP 112* 106* 151* 143* 102*   Lipid Profile: No results for input(s): CHOL, HDL, LDLCALC, TRIG, CHOLHDL, LDLDIRECT in the last 72 hours. Thyroid Function Tests: No results for input(s): TSH, T4TOTAL, FREET4, T3FREE, THYROIDAB in the last 72 hours. Anemia Panel: No results for input(s): VITAMINB12, FOLATE, FERRITIN, TIBC, IRON, RETICCTPCT in the last 72 hours. Sepsis Labs: No results for input(s): PROCALCITON, LATICACIDVEN in the last 168 hours.  Recent Results (from the past 240 hour(s))  C Difficile Quick Screen w PCR reflex     Status: Abnormal   Collection Time: 11/21/19  1:36 PM   Specimen: STOOL  Result Value Ref Range Status   C Diff antigen POSITIVE (A) NEGATIVE  Final   C Diff toxin NEGATIVE NEGATIVE Final   C Diff interpretation Results are indeterminate. See PCR results.  Final    Comment: Performed at [facility], [place] [address], [place], [place] [address]  C. Diff by PCR, Reflexed     Status: None   Collection Time: 11/21/19  1:36 PM  Result Value Ref Range Status   Toxigenic C. Difficile by PCR NEGATIVE NEGATIVE Final    Comment: Patient is colonized with non toxigenic C. difficile. May not need treatment unless significant symptoms are present. Performed at [facility], [place] [address], [place], [place] [address]   SARS Coronavirus 2 by RT PCR (hospital order, performed in [facility] hospital lab) Nasopharyngeal Nasopharyngeal Swab     Status: None   Collection Time: 11/27/19  7:06 PM   Specimen: Nasopharyngeal Swab  Result Value Ref Range Status   SARS Coronavirus 2 NEGATIVE NEGATIVE Final    Comment: (NOTE) SARS-CoV-2 target nucleic acids are NOT DETECTED.  The SARS-CoV-2 RNA is generally detectable in upper and lower respiratory specimens during the acute phase of infection. The lowest concentration of SARS-CoV-2 viral copies this assay can detect is 250 copies / mL. A negative result does not preclude SARS-CoV-2 infection and should not be used as the sole basis for treatment or other patient management decisions.  A negative result may occur with improper specimen collection / handling, submission of specimen other than nasopharyngeal swab, presence of viral mutation(s) within the areas targeted by this assay, and inadequate number of viral copies (<250 copies / mL). A negative result must be combined with clinical observations, patient history, and epidemiological information.  Fact Sheet for Patients:   StrictlyIdeas.no  Fact Sheet for Healthcare Providers: BankingDealers.co.za  This test is not yet approved or  cleared by the [place] FDA and has been  authorized for detection and/or diagnosis of SARS-CoV-2 by FDA under an Emergency Use Authorization (EUA).  This EUA will remain  in effect (meaning this test can be used) for the duration of the COVID-19 declaration under Section 564(b)(1) of the Act, 21 U.S.C. section 360bbb-3(b)(1), unless the authorization is terminated or revoked sooner.  Performed at [facility], [place] [address] [place], [place] [address]   MRSA PCR Screening     Status: None   Collection Time: 11/29/19  7:40 AM   Specimen: Nasopharyngeal  Result Value Ref Range Status   MRSA by PCR NEGATIVE NEGATIVE Final    Comment:        The GeneXpert MRSA Assay (FDA approved for NASAL specimens only), is one component of a comprehensive MRSA colonization surveillance program. It is not intended to diagnose MRSA infection nor to guide or monitor treatment for MRSA infections. Performed at [facility], [place] [address], [place], [place] [address]       Radiology Studies: No results found.    Scheduled Meds: . allopurinol  200 mg Oral QHS  . aspirin EC  81 mg Oral q AM  . atorvastatin  80 mg Oral QHS  . DULoxetine  30 mg Oral QHS  . furosemide  80 mg Intravenous BID  . gabapentin  100 mg Oral BID  . heparin injection (subcutaneous)  5,000 Units Subcutaneous Q8H  . hydrALAZINE  25 mg Oral TID  . insulin aspart  0-15 Units Subcutaneous TID WC  . insulin aspart  0-5 Units Subcutaneous QHS  . pantoprazole  20 mg Oral Daily  . potassium chloride SA  20 mEq Oral Daily  . sodium chloride flush  3 mL Intravenous Q12H   Continuous Infusions: . sodium chloride       LOS: 4 days      Time spent: 25 minutes   Dessa Phi, DO [facility] 12/01/2019, 10:33 AM   Available via Epic secure chat 7am-7pm After these hours, please refer to coverage provider listed on [email or web address]

## 2019-12-01 NOTE — Progress Notes (Signed)
Manufacturing engineer [facility])  Spoke with dtr, DME has not been delivered yet.  Does not appear that pt is ready to d/c home due to her O2 needs currently.  [facility] will arrange for DME to be set up.  Once ready to d/c home, please send completed DNR and arrange for any comfort medications that may be needed so there is no lapse in her comfort prior to hospice arriving.  Venia Carbon RN, BSN, [facility]iaison

## 2019-12-01 NOTE — Plan of Care (Signed)
  Problem: Education: Goal: Ability to demonstrate management of disease process will improve Outcome: Not Progressing Goal: Ability to verbalize understanding of medication therapies will improve Outcome: Not Progressing Goal: Individualized Educational Video(s) Outcome: Not Progressing   Problem: Activity: Goal: Capacity to carry out activities will improve Outcome: Not Progressing   Problem: Cardiac: Goal: Ability to achieve and maintain adequate cardiopulmonary perfusion will improve Outcome: Not Progressing

## 2019-12-02 LAB — BASIC METABOLIC PANEL
Anion gap: 8 (ref 5–15)
BUN: 38 mg/dL — ABNORMAL HIGH (ref 8–23)
CO2: 24 mmol/L (ref 22–32)
Calcium: 9.5 mg/dL (ref 8.9–10.3)
Chloride: 108 mmol/L (ref 98–111)
Creatinine, Ser: 2.14 mg/dL — ABNORMAL HIGH (ref 0.44–1.00)
GFR calc Af Amer: 27 mL/min — ABNORMAL LOW (ref >60)
GFR calc non Af Amer: 24 mL/min — ABNORMAL LOW (ref >60)
Glucose, Bld: 141 mg/dL — ABNORMAL HIGH (ref 70–99)
Potassium: 4.9 mmol/L (ref 3.5–5.1)
Sodium: 140 mmol/L (ref 135–145)

## 2019-12-02 LAB — GLUCOSE, CAPILLARY
Glucose-Capillary: 139 mg/dL — ABNORMAL HIGH (ref 70–99)
Glucose-Capillary: 105 mg/dL — ABNORMAL HIGH (ref 70–99)

## 2019-12-02 MED ORDER — MORPHINE SULFATE (CONCENTRATE) 20 MG/ML PO SOLN: 5.0000 mg | ORAL | 0 refills | Status: DC | PRN

## 2019-12-02 MED ORDER — LORAZEPAM 0.5 MG PO TABS: 0.5000 mg | ORAL_TABLET | ORAL | 0 refills | Status: DC | PRN

## 2019-12-02 MED ORDER — HYDRALAZINE HCL 25 MG PO TABS: 25.0000 mg | ORAL_TABLET | Freq: Three times a day (TID) | ORAL | 0 refills | Status: DC

## 2019-12-02 MED FILL — MORPHINE SULF 100 MG/5 ML S: 100 | 7 days supply | Qty: 12 | Fill #0

## 2019-12-02 MED FILL — hydrALAZINE HCL 25 MG TABS: 25 | 30 days supply | Qty: 90 | Fill #0

## 2019-12-02 MED FILL — LORazepam 0.5 MG TABS: 0.5 | 10 days supply | Qty: 42 | Fill #0

## 2019-12-02 NOTE — Plan of Care (Signed)
  Problem: Education: Goal: Ability to demonstrate management of disease process will improve Outcome: Not Progressing Goal: Ability to verbalize understanding of medication therapies will improve Outcome: Not Progressing Goal: Individualized Educational Video(s) Outcome: Not Progressing   Problem: Activity: Goal: Capacity to carry out activities will improve Outcome: Not Progressing   Problem: Cardiac: Goal: Ability to achieve and maintain adequate cardiopulmonary perfusion will improve Outcome: Not Progressing

## 2019-12-02 NOTE — TOC Transition Note (Addendum)
Transition of Care [facility]) - CM/SW Discharge Note   Patient Details  Name: Kara Hanson MRN: [id number] Date of Birth: [birth year]  Transition of Care [facility]) CM/SW Contact:  Joanne Chars, LCSW Phone Number: 12/02/2019, 12:31 PM   Clinical Narrative:   Pt discharging home with [facility].  [facility] called that DME has not been delivered yet and pt will hold until it is at the home.  CSW spoke with daughter [name] who is expecting pt.  Hospital discharge appt at [facility] scheduled--first available is 9/29 and they will call pt if they can move this up.  1430: pt has not yet signed paperwork with hospice.  [facility] will transport.    Final next level of care: Home w Hospice Care Barriers to Discharge: No Barriers Identified   Patient Goals and CMS Choice Patient states their goals for this hospitalization and ongoing recovery are:: hospice CMS Medicare.gov Compare Post Acute Care list provided to:: Patient Choice offered to / list presented to : Patient  Discharge Placement                    Patient and family notified of of transfer: 12/02/19  Discharge Plan and Services     Post Acute Care Choice: Hospice ([place])          DME Arranged:  (throught hospice)           [place]: Hospice and [place] Date [place]: 12/02/19 Time HH Agency Contacted: 1100 Representative spoke with at [place]: [place] ([place]) Interventions     Readmission Risk Interventions Readmission Risk Prevention Plan 12/02/2019 10/03/2019 05/22/2019  Transportation Screening Complete Complete Complete  PCP or Specialist Appt within 3-5 Days - - -  Not Complete comments - - -  [place] or [place] Work Consult for [place] Planning/Counseling - - -  [place] - - -  Medication Review Press photographer) Complete Complete Complete  PCP or Specialist appointment  within 3-5 days of discharge Complete - Complete  [place] or [place] Complete Complete Complete  SW Recovery Care/Counseling Consult Complete Complete Complete  Palliative Care Screening Complete Not Applicable Not [place] Not Applicable Not Applicable Not Applicable  Some recent data might be hidden

## 2019-12-02 NOTE — Telephone Encounter (Signed)
Yes I will

## 2019-12-02 NOTE — Discharge Summary (Signed)
Physician Discharge Summary  Kara Hanson PHX:[id number] DOB: January 11, 1956 DOA: 11/27/2019  PCP: [name], MD  Admit date: 11/27/2019 Discharge date: 12/02/2019  Admitted From: Home Disposition:  Home with hospice   Discharge Condition: Stable but with poor prognosis  CODE STATUS: DNR  Diet recommendation:  Diet Orders (From admission, onward)    Start     Ordered   12/02/19 0000  Diet - low sodium heart healthy        12/02/19 1005   11/28/19 1046  Diet heart healthy/carb modified Room service appropriate? Yes; Fluid consistency: Thin; Fluid restriction: 1500 mL Fluid  Diet effective now       Question Answer Comment  Diet-HS Snack? Nothing   Room service appropriate? Yes   Fluid consistency: Thin   Fluid restriction: 1500 mL Fluid      11/28/19 1045         Brief/Interim Summary: Kara Hanson is a 64 y.o.femalewith medical history significant ofhypertension, systolic and diastolic congestive heart failure(last EF20-25%), insulin-dependent diabetes mellitus, chronic hypoxic respiratory failure on 4 L of nasal cannula oxygen, pneumonia due to COVID-19, chronic kidney disease stage IVpresented to hospital with complaint of shortness of breath. Patient was admitted on 11/19/2019 and discharged on 11/26/2019. She was hospitalized for acute on chronic hypoxic respiratory failure secondary to acute on chronic combined systolic and diastolic heart failure as well as acute diverticulitis. According to patient, she was feeling fine when she was discharged from hospital but upon arrival to home, she started feeling shortness of breath. Reportedly, when EMS arrived, she was found to be saturating 70% while on 4 L nasal oxygen which is her baseline oxygen use. She was brought into the emergency department and was on nonrebreather. Chest x-ray showed pulmonary edema. She was given IV lasix.  Cardiology as well as palliative care team were consulted. Advanced heart failure team did  not have any other medical therapies to offer to patient for her end-stage heart failure.  They recommended palliative care and hospice.  Patient and daughter met with palliative care team, elected for home hospice on discharge.  Discharge Diagnoses:  Principal Problem:   Acute on chronic combined systolic and diastolic CHF (congestive heart failure) (HCC) Active Problems:   Hypertension   Type 2 diabetes mellitus with diabetic neuropathy, unspecified (HCC)   Hyperlipidemia LDL goal <70   Acute on chronic respiratory failure with hypoxia (HCC)   Elevated troponin   DNR (do not resuscitate)   DNI (do not intubate)   Advanced directives, counseling/discussion   Shortness of breath   Acuteon chronicrespiratory failure with hypoxia secondary to acuteon chroniccombined systolic and diastolicCHF exacerbation -At baseline, patient requires 4 L of nasal cannula oxygen -BNP 1337.9 -Chest x-ray revealed mild pulmonary edema, small bilateral pleural effusions -Continue torsemide -Strict I's and O's, daily weight -Cardiology has recommended palliative care/hospice -Weaned off BiPAP.  Placed her on her home 4 L nasal cannula O2 this morning without significant desaturation  Acute diverticulitis -Completed 10 day antibiotic course  Demand ischemia  -Elevated troponin likely secondary to heart failure exacerbation as well as respiratory failure.  Patient denies any chest pain  Diabetes mellitus type 2, with complication including peripheral neuropathy -Hemoglobin A1c 6.1  Essential hypertension -Continue hydralazine, torsemide  Chronic kidney disease stage IV -Baseline Cr 1.8-1.9 -Stable  Hyperlipidemia -Continue statin  Depression -Continue Cymbalta   Discharge Instructions  Discharge Instructions    Diet - low sodium heart healthy   Complete by: As directed  Discharge instructions   Complete by: As directed    You were cared for by a hospitalist during your  hospital stay. If you have any questions about your discharge medications or the care you received while you were in the hospital after you are discharged, you can call the unit and ask to speak with the hospitalist on call if the hospitalist that took care of you is not available. Once you are discharged, your primary care physician will handle any further medical issues. Please note that NO REFILLS for any discharge medications will be authorized once you are discharged, as it is imperative that you return to your primary care physician (or establish a relationship with a primary care physician if you do not have one) for your aftercare needs so that they can reassess your need for medications and monitor your lab values.   Increase activity slowly   Complete by: As directed      Allergies as of 12/02/2019      Reactions   Garlic Shortness Of Breath, Itching, Swelling, Other (See Comments)   "Raw garlic" = Hand itching and swelling   Isosorbide Other (See Comments)   Sneezing and runny nose   Latex Itching   Morphine And Related Itching, Other (See Comments)   Headache   Other Itching, Other (See Comments)   Reaction to newspaper ink- Headaches, also      Medication List    STOP taking these medications   Accu-Chek FastClix Lancets Misc   Accu-Chek Guide test strip Generic drug: glucose blood   Accu-Chek Guide w/Device Kit   amoxicillin-clavulanate 500-125 MG tablet Commonly known as: Augmentin   Basaglar KwikPen 100 UNIT/ML   Dexcom G6 Receiver Devi   Dexcom G6 Sensor Misc   Dexcom G6 Transmitter Misc   Easy Comfort Pen Needles 31G X 5 MM Misc Generic drug: Insulin Pen Needle   insulin aspart 100 UNIT/ML injection Commonly known as: novoLOG   Lancet Device Misc     TAKE these medications   acetaminophen 500 MG tablet Commonly known as: TYLENOL Take 500-1,000 mg by mouth every 6 (six) hours as needed for mild pain or headache.   allopurinol 100 MG  tablet Commonly known as: ZYLOPRIM Take 2 tablets (200 mg total) by mouth at bedtime.   aspirin EC 81 MG tablet Take 81 mg by mouth in the morning.   atorvastatin 80 MG tablet Commonly known as: LIPITOR Take 80 mg by mouth at bedtime.   DULoxetine 30 MG capsule Commonly known as: CYMBALTA Take 1 capsule (30 mg total) by mouth daily. What changed: when to take this   fluticasone 50 MCG/ACT nasal spray Commonly known as: FLONASE Place 2 sprays into both nostrils daily as needed.   gabapentin 100 MG capsule Commonly known as: NEURONTIN TAKE 1 CAPSULE (100 MG TOTAL) BY MOUTH 2 (TWO) TIMES DAILY. What changed: See the new instructions.   hydrALAZINE 25 MG tablet Commonly known as: APRESOLINE Take 1 tablet (25 mg total) by mouth 3 (three) times daily. What changed: how much to take   lansoprazole 15 MG capsule Commonly known as: PREVACID TAKE 1 CAPSULE (15 MG TOTAL) BY MOUTH DAILY. What changed: See the new instructions.   LORazepam 0.5 MG tablet Commonly known as: ATIVAN Take 1 tablet (0.5 mg total) by mouth every 4 (four) hours as needed for anxiety. May crush, mix with water and give sublingually if needed.   Misc. Devices Misc Portable oxygen concentrator.  Diagnosis-chronic respiratory failure.  Misc. Devices Misc Rollaor with seat. Dx: Congestive Heart Failure   Misc. Devices Misc Scale; Dx - CHF   Misc. [facility] bed.  Diagnosis CHF.  Lifetime use.  Weight 165 lbs.   Misc. Scientist, physiological chair DX CHF   Misc. Devices Misc Manual wheelchai with cushion, anti - tippers, brakes and lock.  Diagnosis congestive heart failure.  Weight 140 pounds.   morphine 20 MG/ML concentrated solution Commonly known as: ROXANOL Take 0.25 mLs (5 mg total) by mouth every 4 (four) hours as needed for shortness of breath.   multivitamin with minerals tablet Take 1 tablet by mouth daily with breakfast.   OXYGEN Inhale 4 L/min into the lungs continuous.    polyethylene glycol 17 g packet Commonly known as: MIRALAX / GLYCOLAX Take 17 g by mouth daily as needed for moderate constipation.   potassium chloride SA 20 MEQ tablet Commonly known as: KLOR-CON Take 1 tablet (20 mEq total) by mouth daily for 7 days.   primidone 50 MG tablet Commonly known as: MYSOLINE Take 1 tablet (50 mg total) by mouth in the morning and at bedtime.   Super B Complex/C Caps Take 1 capsule by mouth daily.   torsemide 20 MG tablet Commonly known as: DEMADEX Take 2 tablets (40 mg total) by mouth 2 (two) times daily.   Vitamin D (Ergocalciferol) 1.25 MG (50000 UNIT) Caps capsule Commonly known as: DRISDOL Take 50,000 Units by mouth every Sunday.       Allergies  Allergen Reactions  . Garlic Shortness Of Breath, Itching, Swelling and Other (See Comments)    "Raw garlic" = Hand itching and swelling  . Isosorbide Other (See Comments)    Sneezing and runny nose  . Latex Itching  . Morphine And Related Itching and Other (See Comments)    Headache   . Other Itching and Other (See Comments)    Reaction to newspaper ink- Headaches, also    Consultations:  Heart failure  Palliative care   Procedures/Studies: CT ABDOMEN PELVIS WO CONTRAST  Result Date: 11/19/2019 CLINICAL DATA:  Abdominal distension EXAM: CT ABDOMEN AND PELVIS WITHOUT CONTRAST TECHNIQUE: Multidetector CT imaging of the abdomen and pelvis was performed following the standard protocol without IV contrast. COMPARISON:  01/24/2019 FINDINGS: Lower chest: Moderate bilateral pleural effusions are noted, with dense areas of consolidation at the lung bases and bilateral air bronchograms, without significant change since prior CT. Heart is enlarged without pericardial effusion. Hepatobiliary: No focal liver abnormality is seen. Status post cholecystectomy. No biliary dilatation. Pancreas: Unremarkable. No pancreatic ductal dilatation or surrounding inflammatory changes. Spleen: Normal in size  without focal abnormality. Adrenals/Urinary Tract: No urinary tract calculi or obstructive uropathy. Bladder is unremarkable with no filling defects. Adrenals are normal. Stomach/Bowel: No bowel obstruction or ileus. Normal appendix right lower quadrant. There is diverticulosis of the descending colon, with focal areas of inflammatory change along the anteromedial aspect proximal descending colon, reference images 47-50/2, consistent with focal acute uncomplicated diverticulitis. Vascular/Lymphatic: Aortic atherosclerosis. No enlarged abdominal or pelvic lymph nodes. Reproductive: Stable uterine fibroids. No adnexal masses. Other: No free fluid or free gas within the abdomen. Fat containing midline ventral hernia unchanged. No bowel herniation. Musculoskeletal: No acute or destructive bony lesions. Reconstructed images demonstrate no additional findings. IMPRESSION: 1. Focal acute uncomplicated diverticulitis of the proximal descending colon. 2. Moderate bilateral pleural effusions with dense areas of consolidation at the lung bases and bilateral air bronchograms, without significant change since prior CT. 3. Aortic Atherosclerosis (ICD10-I70.0). Electronically  Signed   By: Randa Ngo M.D.   On: 11/19/2019 16:15   DG Abd 1 View  Result Date: 11/28/2019 CLINICAL DATA:  Abdominal pain. EXAM: ABDOMEN - 1 VIEW COMPARISON:  CT 11/19/2019.  Radiograph 11/20/2019 FINDINGS: No evidence of free intra-abdominal air on supine views. Cholecystectomy clips in the right upper quadrant. Air within mildly prominent stomach. No evidence of obstruction. There is occasional air throughout the colon. Paucity of small bowel gas. No significant formed stool. Few phleboliths in the pelvis. IMPRESSION: Nonobstructive bowel gas pattern. Decreased stool burden from prior exam. Electronically Signed   By: Keith Rake M.D.   On: 11/28/2019 22:50   DG Abd 1 View  Result Date: 11/20/2019 CLINICAL DATA:  Abdominal pain with  diarrhea EXAM: ABDOMEN - 1 VIEW COMPARISON:  CT abdomen and pelvis November 19, 2019 FINDINGS: There is moderate stool in the colon. There is no bowel dilatation or air-fluid level to suggest bowel obstruction. No free air. There are surgical clips in the gallbladder fossa region. There are small apparent phleboliths in the pelvis. IMPRESSION: No bowel obstruction or free air evident.  Moderate stool in colon. Electronically Signed   By: Lowella Grip III M.D.   On: 11/20/2019 09:57   DG Chest Port 1 View  Result Date: 11/27/2019 CLINICAL DATA:  Shortness of breath EXAM: PORTABLE CHEST 1 VIEW COMPARISON:  11/19/2019 FINDINGS: Persistent interstitial prominence with patchy increased density and small bilateral pleural effusions with bibasilar atelectasis. Similar cardiomegaly. No pneumothorax. IMPRESSION: Similar appearance with probable mild pulmonary edema and small bilateral pleural effusions with bibasilar atelectasis. Electronically Signed   By: Macy Mis M.D.   On: 11/27/2019 15:04   DG Chest Portable 1 View  Result Date: 11/19/2019 CLINICAL DATA:  Shortness of breath, lethargic EXAM: PORTABLE CHEST 1 VIEW COMPARISON:  Radiograph 09/26/2019 FINDINGS: Central vascular congestion with diffusely increased hazy interstitial opacities and more patchy and confluent infrahilar opacity partially silhouetting the lung bases likely with bilateral effusions. Fissural and septal thickening is noted as well. No pneumothorax. Mild cardiomegaly similar to comparison imaging accounting for differences in technique. The aorta is calcified. The remaining cardiomediastinal contours are unremarkable. No acute osseous or soft tissue abnormality. Degenerative changes are present in the imaged spine and shoulders. Telemetry leads overlie the chest. IMPRESSION: Features of pulmonary edema with bilateral effusions. More patchy and confluent infrahilar opacity could reflect developing alveolar edema or  infection/consolidation. Cardiomegaly similar to priors. Aortic Atherosclerosis (ICD10-I70.0). Electronically Signed   By: Lovena Le M.D.   On: 11/19/2019 05:07       Discharge Exam: Vitals:   12/02/19 0844 12/02/19 0912  BP: 133/73   Pulse: 97   Resp: 20   Temp: (!) 97.5 F (36.4 C)   SpO2: 97% 95%    General: Pt is alert, awake, not in acute distress Cardiovascular: RRR, S1/S2 +, no edema Respiratory: CTA bilaterally, no wheezing, no rhonchi, no conversational dyspnea, on nasal cannula O2 without distress Abdominal: Soft, NT, ND, bowel sounds + Extremities: no edema, no cyanosis Psych: Normal mood and affect, stable judgement and insight     The results of significant diagnostics from this hospitalization (including imaging, microbiology, ancillary and laboratory) are listed below for reference.     Microbiology: Recent Results (from the past 240 hour(s))  SARS Coronavirus 2 by RT PCR (hospital order, performed in [facility] hospital lab) Nasopharyngeal Nasopharyngeal Swab     Status: None   Collection Time: 11/27/19  7:06 PM   Specimen:  Nasopharyngeal Swab  Result Value Ref Range Status   SARS Coronavirus 2 NEGATIVE NEGATIVE Final    Comment: (NOTE) SARS-CoV-2 target nucleic acids are NOT DETECTED.  The SARS-CoV-2 RNA is generally detectable in upper and lower respiratory specimens during the acute phase of infection. The lowest concentration of SARS-CoV-2 viral copies this assay can detect is 250 copies / mL. A negative result does not preclude SARS-CoV-2 infection and should not be used as the sole basis for treatment or other patient management decisions.  A negative result may occur with improper specimen collection / handling, submission of specimen other than nasopharyngeal swab, presence of viral mutation(s) within the areas targeted by this assay, and inadequate number of viral copies (<250 copies / mL). A negative result must be combined with  clinical observations, patient history, and epidemiological information.  Fact Sheet for Patients:   StrictlyIdeas.no  Fact Sheet for Healthcare Providers: BankingDealers.co.za  This test is not yet approved or  cleared by the [place] FDA and has been authorized for detection and/or diagnosis of SARS-CoV-2 by FDA under an Emergency Use Authorization (EUA).  This EUA will remain in effect (meaning this test can be used) for the duration of the COVID-19 declaration under Section 564(b)(1) of the Act, 21 U.S.C. section 360bbb-3(b)(1), unless the authorization is terminated or revoked sooner.  Performed at [facility], [place] [address], [place], [place] [address]   MRSA PCR Screening     Status: None   Collection Time: 11/29/19  7:40 AM   Specimen: Nasopharyngeal  Result Value Ref Range Status   MRSA by PCR NEGATIVE NEGATIVE Final    Comment:        The GeneXpert MRSA Assay (FDA approved for NASAL specimens only), is one component of a comprehensive MRSA colonization surveillance program. It is not intended to diagnose MRSA infection nor to guide or monitor treatment for MRSA infections. Performed at [facility], [place] [address] [place], [place] [address]      Labs: BNP (last 3 results) Recent Labs    11/19/19 0510 11/23/19 1306 11/27/19 1440  BNP 863.1* 867.6* 1,657.9*   Basic Metabolic Panel: Recent Labs  Lab 11/27/19 1440 11/27/19 1440 11/27/19 1615 11/27/19 1804 11/29/19 0111 11/30/19 0057 12/01/19 0202 12/02/19 0020  NA 140   < > 140  --  141 140 139 140  K 4.3   < > 4.4  --  4.5 4.3 4.5 4.9  CL 106  --   --   --  108 107 106 108  CO2 21*  --   --   --  20* _0 GLUCOSE 128*  --   --   --  104* 105* 133* 141*  BUN 38*  --   --   --  37* 35* 36* 38*  CREATININE 1.91*  --   --   --  1.99* 1.94* 2.06* 2.14*  CALCIUM 9.3  --   --   --  9.2 9.5 9.6 9.5  MG  --   --   --  1.9 1.7  --    --   --    < > = values in this interval not displayed.   Liver Function Tests: Recent Labs  Lab 11/27/19 1440  AST 15  ALT 19  ALKPHOS 80  BILITOT 0.5  PROT 6.4*  ALBUMIN 2.9*   No results for input(s): LIPASE, AMYLASE in the last 168 hours. No results for input(s): AMMONIA in the  last 168 hours. CBC: Recent Labs  Lab 11/26/19 0851 11/27/19 1440 11/27/19 1615  WBC  --  7.6  --   NEUTROABS  --  6.6  --   HGB 7.8* 9.4* 9.9*  HCT 26.4* 31.8* 29.0*  MCV  --  96.1  --   PLT  --  311  --    Cardiac Enzymes: No results for input(s): CKTOTAL, CKMB, CKMBINDEX, TROPONINI in the last 168 hours. BNP: Invalid input(s): POCBNP CBG: Recent Labs  Lab 12/01/19 0737 12/01/19 1152 12/01/19 1624 12/01/19 2101 12/02/19 0819  GLUCAP 102* 105* 154* 148* 139*   D-Dimer No results for input(s): DDIMER in the last 72 hours. Hgb A1c No results for input(s): HGBA1C in the last 72 hours. Lipid Profile No results for input(s): CHOL, HDL, LDLCALC, TRIG, CHOLHDL, LDLDIRECT in the last 72 hours. Thyroid function studies No results for input(s): TSH, T4TOTAL, T3FREE, THYROIDAB in the last 72 hours.  Invalid input(s): FREET3 Anemia work up No results for input(s): VITAMINB12, FOLATE, FERRITIN, TIBC, IRON, RETICCTPCT in the last 72 hours. Urinalysis    Component Value Date/Time   COLORURINE AMBER (A) 09/26/2019 1645   APPEARANCEUR CLOUDY (A) 09/26/2019 1645   LABSPEC 1.014 09/26/2019 1645   PHURINE 5.0 09/26/2019 1645   GLUCOSEU 50 (A) 09/26/2019 1645   HGBUR SMALL (A) 09/26/2019 1645   BILIRUBINUR NEGATIVE 09/26/2019 1645   BILIRUBINUR negative 12/13/2017 1514   KETONESUR NEGATIVE 09/26/2019 1645   PROTEINUR >=300 (A) 09/26/2019 1645   UROBILINOGEN 0.2 12/13/2017 1514   NITRITE NEGATIVE 09/26/2019 1645   LEUKOCYTESUR SMALL (A) 09/26/2019 1645   Sepsis Labs Invalid input(s): PROCALCITONIN,  WBC,  LACTICIDVEN Microbiology Recent Results (from the past 240 hour(s))  SARS  Coronavirus 2 by RT PCR (hospital order, performed in [place] hospital lab) Nasopharyngeal Nasopharyngeal Swab     Status: None   Collection Time: 11/27/19  7:06 PM   Specimen: Nasopharyngeal Swab  Result Value Ref Range Status   SARS Coronavirus 2 NEGATIVE NEGATIVE Final    Comment: (NOTE) SARS-CoV-2 target nucleic acids are NOT DETECTED.  The SARS-CoV-2 RNA is generally detectable in upper and lower respiratory specimens during the acute phase of infection. The lowest concentration of SARS-CoV-2 viral copies this assay can detect is 250 copies / mL. A negative result does not preclude SARS-CoV-2 infection and should not be used as the sole basis for treatment or other patient management decisions.  A negative result may occur with improper specimen collection / handling, submission of specimen other than nasopharyngeal swab, presence of viral mutation(s) within the areas targeted by this assay, and inadequate number of viral copies (<250 copies / mL). A negative result must be combined with clinical observations, patient history, and epidemiological information.  Fact Sheet for Patients:   StrictlyIdeas.no  Fact Sheet for Healthcare Providers: BankingDealers.co.za  This test is not yet approved or  cleared by the [place] FDA and has been authorized for detection and/or diagnosis of SARS-CoV-2 by FDA under an Emergency Use Authorization (EUA).  This EUA will remain in effect (meaning this test can be used) for the duration of the COVID-19 declaration under Section 564(b)(1) of the Act, 21 U.S.C. section 360bbb-3(b)(1), unless the authorization is terminated or revoked sooner.  Performed at [facility], [place] [address] [place], [place] [address]   MRSA PCR Screening     Status: None   Collection Time: 11/29/19  7:40 AM   Specimen: Nasopharyngeal  Result Value Ref Range Status  MRSA by PCR NEGATIVE NEGATIVE Final     Comment:        The GeneXpert MRSA Assay (FDA approved for NASAL specimens only), is one component of a comprehensive MRSA colonization surveillance program. It is not intended to diagnose MRSA infection nor to guide or monitor treatment for MRSA infections. Performed at [facility], [place] [address] [place], [place] [address]      Patient was seen and examined on the day of discharge and was found to be in stable condition. Time coordinating discharge: 35 minutes including assessment and coordination of care, as well as examination of the patient.   SIGNED:  Dessa Phi, DO [facility] 12/02/2019, 10:05 AM

## 2019-12-02 NOTE — TOC Progression Note (Signed)
Transition of Care [facility]) - Progression Note    Patient Details  Name: Kara Hanson MRN: [id number] Date of Birth: 07/02/1955  Transition of Care [facility]) CM/SW [place], RN Phone Number: [phone]  12/02/2019, 12:17 PM  Clinical Narrative:    Medication co pay paid from [facility] petty cash. Medications to be delivered to the bedside.   Expected Discharge Plan: Home w Hospice Care Barriers to Discharge: No Barriers Identified  Expected Discharge Plan and Services Expected Discharge Plan: [place] [facility] Acute Care Choice: Hospice ([facility]) Living arrangements for the past 2 months: Single Family Home Expected Discharge Date: 12/02/19                           [place] Date [facility] Agency Contacted: 11/29/19 Time [place] Agency Contacted: 59 Representative spoke with at [place]: [place] Determinants of Health ([place]) Interventions    Readmission Risk Interventions Readmission Risk Prevention Plan 10/03/2019 05/22/2019 02/11/2019  Transportation Screening Complete Complete Complete  PCP or Specialist Appt within 3-5 Days - - -  Not Complete comments - - -  HRI or [place] for [place] Planning/Counseling - - -  [place] - - -  Medication Review Press photographer) Complete Complete Complete  PCP or Specialist appointment within 3-5 days of discharge - Complete Complete  HRI or [place] Complete Complete Complete  SW Recovery Care/Counseling Consult Complete Complete Complete  Palliative Care Screening Not Applicable Not Applicable Not [place] Not Applicable Not Applicable Not Applicable  Some recent data might be hidden

## 2019-12-02 NOTE — Telephone Encounter (Signed)
[facility] was called and informed that Dr.Newlin will be the attending physician.

## 2019-12-03 ENCOUNTER — Encounter (HOSPITAL_COMMUNITY): Payer: Medicare Other

## 2019-12-03 ENCOUNTER — Telehealth (HOSPITAL_COMMUNITY): Payer: Self-pay

## 2019-12-03 ENCOUNTER — Other Ambulatory Visit: Payer: Self-pay | Admitting: *Deleted

## 2019-12-03 NOTE — Patient Outreach (Signed)
[facility]) Care Management  12/03/2019  Sherrine Salberg [birth year] [phone]   Notified that member discharged home from hospital with hospice.  Will close case at this time.  Valente David, [place], MSN [place] [phone]

## 2019-12-03 NOTE — Telephone Encounter (Signed)
Canceled appointment with Dr. Marlou Porch as requested by her daughter and patient. Hospice RN will be seeing patient in home on [place].

## 2019-12-04 ENCOUNTER — Ambulatory Visit: Payer: Medicare Other | Admitting: Cardiology

## 2019-12-04 ENCOUNTER — Telehealth: Payer: Self-pay

## 2019-12-04 ENCOUNTER — Telehealth (HOSPITAL_COMMUNITY): Payer: Self-pay

## 2019-12-04 DIAGNOSIS — Z8679 Personal history of other diseases of the circulatory system: Secondary | ICD-10-CM | POA: Diagnosis not present

## 2019-12-04 DIAGNOSIS — I252 Old myocardial infarction: Secondary | ICD-10-CM | POA: Diagnosis not present

## 2019-12-04 DIAGNOSIS — E559 Vitamin D deficiency, unspecified: Secondary | ICD-10-CM | POA: Diagnosis not present

## 2019-12-04 DIAGNOSIS — J9611 Chronic respiratory failure with hypoxia: Secondary | ICD-10-CM | POA: Diagnosis not present

## 2019-12-04 DIAGNOSIS — K219 Gastro-esophageal reflux disease without esophagitis: Secondary | ICD-10-CM | POA: Diagnosis not present

## 2019-12-04 DIAGNOSIS — Z8616 Personal history of COVID-19: Secondary | ICD-10-CM | POA: Diagnosis not present

## 2019-12-04 DIAGNOSIS — E1122 Type 2 diabetes mellitus with diabetic chronic kidney disease: Secondary | ICD-10-CM | POA: Diagnosis not present

## 2019-12-04 DIAGNOSIS — M109 Gout, unspecified: Secondary | ICD-10-CM | POA: Diagnosis not present

## 2019-12-04 DIAGNOSIS — J302 Other seasonal allergic rhinitis: Secondary | ICD-10-CM | POA: Diagnosis not present

## 2019-12-04 DIAGNOSIS — Z8673 Personal history of transient ischemic attack (TIA), and cerebral infarction without residual deficits: Secondary | ICD-10-CM | POA: Diagnosis not present

## 2019-12-04 DIAGNOSIS — I5042 Chronic combined systolic (congestive) and diastolic (congestive) heart failure: Secondary | ICD-10-CM | POA: Diagnosis not present

## 2019-12-04 DIAGNOSIS — F329 Major depressive disorder, single episode, unspecified: Secondary | ICD-10-CM | POA: Diagnosis not present

## 2019-12-04 DIAGNOSIS — I130 Hypertensive heart and chronic kidney disease with heart failure and stage 1 through stage 4 chronic kidney disease, or unspecified chronic kidney disease: Secondary | ICD-10-CM | POA: Diagnosis not present

## 2019-12-04 DIAGNOSIS — N184 Chronic kidney disease, stage 4 (severe): Secondary | ICD-10-CM | POA: Diagnosis not present

## 2019-12-04 DIAGNOSIS — E785 Hyperlipidemia, unspecified: Secondary | ICD-10-CM | POA: Diagnosis not present

## 2019-12-04 DIAGNOSIS — I2510 Atherosclerotic heart disease of native coronary artery without angina pectoris: Secondary | ICD-10-CM | POA: Diagnosis not present

## 2019-12-04 NOTE — Telephone Encounter (Signed)
Transition Care Management Follow-up Telephone Call Call completed with patient's daughter, [name]  Date of discharge and from where: 12/02/2019, [facility]   How have you been since you were released from the hospital? [name] said that her mother has been resting. She does not want to eat and has vomited this morning . [place] was at the house today. Hospice is in the process of exchanging out the current DME/ O2 to their hospice providers.  [name] said that they are waiting on delivery of a new O2 concentrator and she has contacted hospice to check on the status of that delivery. Her mother still has the O2 concentrator from [place] in the meantime yet it keeps beeping because her mother wants the liter flow higher than the machine can accommodate.   Any questions or concerns?  [name] requested a copy of a letter that addressed her mother's need for electricity for her O2 concentrators be mailed to her home. . She said that she never picked up the letter when it was written in the winter/spring.   [name] said that she took off from work this week to be home with her mother.  She does not have much support from other family members who are working. She is exhausted herself and is trying to sleep whenever she can.   She said the aide from hospice should be starting tomorrow and the SW will also be out to the house  tomorrow afternoon.   Items Reviewed:  Did the pt receive and understand the discharge instructions provided?  yes  Medications obtained and verified? [name] has the medications for patient and [name], EMT assists with medication management.  [name] said that her mother is allergic to morphine and morphine was ordered at discharge. She stated that she knows her mother is not to have morphine.   Any new allergies since your discharge?  none reported  Do you have support at home?  [name] is the primary caregiver  Functional Questionnaire: (I =  Independent and D = Dependent) ADLs:family provides needed assistance.   Follow up appointments reviewed:   PCP Hospital f/u appt confirmed? Dr Margarita Rana 12/16/2019 @ [facility] f/u appt confirmed?none scheduled at this time   Are transportation arrangements needed?  no  If their condition worsens, is the pt aware to call PCP or go to the Emergency Dept.?  [name] is aware that she should call hospice.   Was the patient provided with contact information for the PCP's office or ED? she has the phone  Number for [facility] and hospice.   Was to pt encouraged to call back with questions or concerns?  yes

## 2019-12-04 NOTE — Telephone Encounter (Signed)
From discharge call.   Appt scheduled with Dr Margarita Rana 12/16/2019    Kara Hanson said that her mother has been resting. She does not want to eat and has vomited this morning . [place] was at the house today. Hospice is in the process of exchanging out the current DME/ O2 to their hospice providers.  Kara Hanson said that they are waiting on delivery of a new O2 concentrator and she has contacted hospice to check on the status of that delivery. Her mother still has the O2 concentrator from [place] in the meantime yet it keeps beeping because her mother wants the liter flow higher than the machine can accommodate.     Kara Hanson requested a copy of a letter that addressed her mother's need for electricity for her O2 concentrators be mailed to her home. . She said that she never picked up the letter when it was written in the winter/spring.     Kara Hanson said that she took off from work this week to be home with her mother.  She does not have much support from other family members who are working. She is exhausted herself and is trying to sleep whenever she can.     She said the aide from hospice should be starting tomorrow and the SW will also be out to the house  tomorrow afternoon.     Kara Hanson has the medications for patient and [name], EMT assists with medication management.  Kara Hanson said that her mother is allergic to morphine and morphine was ordered at discharge. She stated that she knows her mother is not to have morphine.

## 2019-12-04 NOTE — Telephone Encounter (Signed)
Spoke to [place] at length about hospice visit as well as reviewing medications and assisting with filling pill box over the phone. We facetimed over Iphone and I verified medications and pill box. [name] stated the hospice RN will be reaching out to me today. Kara Hanson is resting with medications such as morhpine and lorazepam today. I plan to see patient in home next week. Call completed.

## 2019-12-05 ENCOUNTER — Telehealth: Payer: Self-pay | Admitting: Family Medicine

## 2019-12-05 ENCOUNTER — Telehealth: Payer: Self-pay

## 2019-12-05 DIAGNOSIS — E785 Hyperlipidemia, unspecified: Secondary | ICD-10-CM | POA: Diagnosis not present

## 2019-12-05 DIAGNOSIS — N184 Chronic kidney disease, stage 4 (severe): Secondary | ICD-10-CM | POA: Diagnosis not present

## 2019-12-05 DIAGNOSIS — E1122 Type 2 diabetes mellitus with diabetic chronic kidney disease: Secondary | ICD-10-CM | POA: Diagnosis not present

## 2019-12-05 DIAGNOSIS — J9611 Chronic respiratory failure with hypoxia: Secondary | ICD-10-CM | POA: Diagnosis not present

## 2019-12-05 DIAGNOSIS — I5042 Chronic combined systolic (congestive) and diastolic (congestive) heart failure: Secondary | ICD-10-CM | POA: Diagnosis not present

## 2019-12-05 DIAGNOSIS — I130 Hypertensive heart and chronic kidney disease with heart failure and stage 1 through stage 4 chronic kidney disease, or unspecified chronic kidney disease: Secondary | ICD-10-CM | POA: Diagnosis not present

## 2019-12-05 NOTE — Telephone Encounter (Signed)
Loran Senters, nurse with hospice, requesting nausea  medicine for the pt,  Compazine, called into the pharmacy.   Pt is experiencing some nausea.   [place], [place] Phone:  [phone]  Fax:  [phone]     cb [phone]

## 2019-12-05 NOTE — Telephone Encounter (Signed)
Letter for electricity assistance and appointment reminded sent to patient's daughter Kara Hanson, as she requested

## 2019-12-05 NOTE — Telephone Encounter (Signed)
Will route to PCP for review. 

## 2019-12-06 DIAGNOSIS — I5042 Chronic combined systolic (congestive) and diastolic (congestive) heart failure: Secondary | ICD-10-CM | POA: Diagnosis not present

## 2019-12-06 DIAGNOSIS — E1122 Type 2 diabetes mellitus with diabetic chronic kidney disease: Secondary | ICD-10-CM | POA: Diagnosis not present

## 2019-12-06 DIAGNOSIS — I130 Hypertensive heart and chronic kidney disease with heart failure and stage 1 through stage 4 chronic kidney disease, or unspecified chronic kidney disease: Secondary | ICD-10-CM | POA: Diagnosis not present

## 2019-12-06 DIAGNOSIS — E785 Hyperlipidemia, unspecified: Secondary | ICD-10-CM | POA: Diagnosis not present

## 2019-12-06 DIAGNOSIS — J9611 Chronic respiratory failure with hypoxia: Secondary | ICD-10-CM | POA: Diagnosis not present

## 2019-12-06 DIAGNOSIS — N184 Chronic kidney disease, stage 4 (severe): Secondary | ICD-10-CM | POA: Diagnosis not present

## 2019-12-06 MED ORDER — PROMETHAZINE HCL 25 MG PO TABS: 25.0000 mg | ORAL_TABLET | Freq: Three times a day (TID) | ORAL | 1 refills | Status: DC | PRN

## 2019-12-06 NOTE — Telephone Encounter (Signed)
I have sent a prescription for promethazine to her pharmacy.

## 2019-12-07 DIAGNOSIS — I5042 Chronic combined systolic (congestive) and diastolic (congestive) heart failure: Secondary | ICD-10-CM | POA: Diagnosis not present

## 2019-12-07 DIAGNOSIS — J9611 Chronic respiratory failure with hypoxia: Secondary | ICD-10-CM | POA: Diagnosis not present

## 2019-12-07 DIAGNOSIS — E785 Hyperlipidemia, unspecified: Secondary | ICD-10-CM | POA: Diagnosis not present

## 2019-12-07 DIAGNOSIS — E1122 Type 2 diabetes mellitus with diabetic chronic kidney disease: Secondary | ICD-10-CM | POA: Diagnosis not present

## 2019-12-07 DIAGNOSIS — N184 Chronic kidney disease, stage 4 (severe): Secondary | ICD-10-CM | POA: Diagnosis not present

## 2019-12-07 DIAGNOSIS — I130 Hypertensive heart and chronic kidney disease with heart failure and stage 1 through stage 4 chronic kidney disease, or unspecified chronic kidney disease: Secondary | ICD-10-CM | POA: Diagnosis not present

## 2019-12-09 DIAGNOSIS — N184 Chronic kidney disease, stage 4 (severe): Secondary | ICD-10-CM | POA: Diagnosis not present

## 2019-12-09 DIAGNOSIS — J9611 Chronic respiratory failure with hypoxia: Secondary | ICD-10-CM | POA: Diagnosis not present

## 2019-12-09 DIAGNOSIS — E1122 Type 2 diabetes mellitus with diabetic chronic kidney disease: Secondary | ICD-10-CM | POA: Diagnosis not present

## 2019-12-09 DIAGNOSIS — I130 Hypertensive heart and chronic kidney disease with heart failure and stage 1 through stage 4 chronic kidney disease, or unspecified chronic kidney disease: Secondary | ICD-10-CM | POA: Diagnosis not present

## 2019-12-09 DIAGNOSIS — E785 Hyperlipidemia, unspecified: Secondary | ICD-10-CM | POA: Diagnosis not present

## 2019-12-09 DIAGNOSIS — I5042 Chronic combined systolic (congestive) and diastolic (congestive) heart failure: Secondary | ICD-10-CM | POA: Diagnosis not present

## 2019-12-10 ENCOUNTER — Telehealth: Payer: Self-pay | Admitting: Family Medicine

## 2019-12-10 DIAGNOSIS — E1122 Type 2 diabetes mellitus with diabetic chronic kidney disease: Secondary | ICD-10-CM | POA: Diagnosis not present

## 2019-12-10 DIAGNOSIS — J9611 Chronic respiratory failure with hypoxia: Secondary | ICD-10-CM | POA: Diagnosis not present

## 2019-12-10 DIAGNOSIS — I5042 Chronic combined systolic (congestive) and diastolic (congestive) heart failure: Secondary | ICD-10-CM | POA: Diagnosis not present

## 2019-12-10 DIAGNOSIS — E785 Hyperlipidemia, unspecified: Secondary | ICD-10-CM | POA: Diagnosis not present

## 2019-12-10 DIAGNOSIS — N184 Chronic kidney disease, stage 4 (severe): Secondary | ICD-10-CM | POA: Diagnosis not present

## 2019-12-10 DIAGNOSIS — I130 Hypertensive heart and chronic kidney disease with heart failure and stage 1 through stage 4 chronic kidney disease, or unspecified chronic kidney disease: Secondary | ICD-10-CM | POA: Diagnosis not present

## 2019-12-10 NOTE — Telephone Encounter (Signed)
Please advise.   Copied from [place] [id number]. Topic: General - Other >> Dec 10, 2019 12:05 PM Celene Kras wrote: Reason for CRM: Kenney Houseman, from [employer], called and is requesting to speak with PCP regarding the switching from hospice to [facility] hospice. Please advise.

## 2019-12-10 NOTE — Telephone Encounter (Signed)
Will route to PCP for review and CC case manager

## 2019-12-11 ENCOUNTER — Other Ambulatory Visit (HOSPITAL_COMMUNITY): Payer: Self-pay

## 2019-12-11 NOTE — Progress Notes (Signed)
Paramedicine Encounter    Patient ID: Kara Hanson, female    DOB: 1956/03/08, 64 y.o.   MRN: [id number]   Patient Care Team: [name], MD as PCP - General (Family Medicine) Jerline Pain, MD as PCP - Cardiology (Cardiology)  Patient Active Problem List   Diagnosis Date Noted  . DNR (do not resuscitate)   . DNI (do not intubate)   . Advanced directives, counseling/discussion   . Shortness of breath   . Abdominal pain 11/19/2019  . CKD (chronic kidney disease), stage IV ([place]) 11/19/2019  . Palliative care by specialist   . Goals of care, counseling/discussion   . Cardiorenal syndrome   . Hyponatremia 09/26/2019  . CHF exacerbation ([place]) 09/26/2019  . Acute on chronic systolic CHF (congestive heart failure) ([place]) 05/19/2019  . Elevated troponin 04/18/2019  . NSTEMI (non-ST elevated myocardial infarction) ([place]) 02/09/2019  . S/P thoracentesis   . Atelectasis   . Chronic respiratory failure ([place])   . CAP (community acquired pneumonia) 10/31/2018  . COVID-19 virus infection 07/20/2018  . Pneumonia due to COVID-19 virus 07/20/2018  . Vitamin D deficiency 04/02/2018  . Spinal stenosis 03/29/2018  . Acute on chronic respiratory failure with hypoxia ([place]) 03/20/2018  . Controlled type 2 diabetes with neuropathy ([place]) 03/20/2018  . Acute on chronic combined systolic and diastolic CHF (congestive heart failure) ([place]) 11/27/2017  . AKI (acute kidney injury) ([place]) 11/27/2017  . Acute respiratory distress 11/27/2017  . Bulging lumbar disc 11/14/2017  . Gout 11/14/2017  . Hyperlipidemia LDL goal <70 09/30/2016  . Chest pain 06/17/2016  . Stress-induced cardiomyopathy 05/19/2016  . Type 2 diabetes mellitus with diabetic neuropathy, unspecified ([place]) 05/06/2016  . Vertigo 05/06/2016  . Controlled type 2 diabetes mellitus with hyperglycemia ([place]) 04/23/2016  . Hypertension 04/23/2016  . Normochromic normocytic anemia 04/23/2016  . Syncope 04/23/2016    Current Outpatient  Medications:  .  acetaminophen (TYLENOL) 500 MG tablet, Take 500-1,000 mg by mouth every 6 (six) hours as needed for mild pain or headache., Disp: , Rfl:  .  allopurinol (ZYLOPRIM) 100 MG tablet, Take 2 tablets (200 mg total) by mouth at bedtime., Disp: 30 tablet, Rfl: 1 .  aspirin EC 81 MG tablet, Take 81 mg by mouth in the morning. , Disp: , Rfl:  .  atorvastatin (LIPITOR) 80 MG tablet, Take 80 mg by mouth at bedtime. , Disp: , Rfl:  .  DULoxetine (CYMBALTA) 30 MG capsule, Take 1 capsule (30 mg total) by mouth daily. (Patient taking differently: Take 30 mg by mouth at bedtime. ), Disp: 30 capsule, Rfl: 3 .  fluticasone (FLONASE) 50 MCG/ACT nasal spray, Place 2 sprays into both nostrils daily as needed. , Disp: , Rfl:  .  gabapentin (NEURONTIN) 100 MG capsule, TAKE 1 CAPSULE (100 MG TOTAL) BY MOUTH 2 (TWO) TIMES DAILY. (Patient taking differently: Take 100 mg by mouth 2 (two) times daily. ), Disp: 60 capsule, Rfl: 2 .  hydrALAZINE (APRESOLINE) 25 MG tablet, Take 1 tablet (25 mg total) by mouth 3 (three) times daily., Disp: 90 tablet, Rfl: 0 .  lansoprazole (PREVACID) 15 MG capsule, TAKE 1 CAPSULE (15 MG TOTAL) BY MOUTH DAILY. (Patient taking differently: Take 15 mg by mouth daily before breakfast. ), Disp: 30 capsule, Rfl: 2 .  LORazepam (ATIVAN) 0.5 MG tablet, Take 1 tablet (0.5 mg total) by mouth every 4 (four) hours as needed for anxiety. May crush, mix with water and give sublingually if needed., Disp: 42 tablet, Rfl: 0 .  Misc. Devices MISC, Portable oxygen concentrator.  Diagnosis-chronic respiratory failure., Disp: 1 each, Rfl: 0 .  Misc. Devices MISC, [place] with seat. Dx: Congestive Heart Failure, Disp: 1 each, Rfl: 0 .  Misc. Devices MISC, Scale; Dx - CHF, Disp: 1 each, Rfl: 0 .  Misc. Devices [facility] bed.  Diagnosis CHF.  Lifetime use.  Weight 165 lbs., Disp: 1 each, Rfl: 0 .  Misc. Devices MISC, Shower chair DX CHF, Disp: 1 each, Rfl: 0 .  Misc. Devices MISC, Manual wheelchai  with cushion, anti - tippers, brakes and lock.  Diagnosis congestive heart failure.  Weight 140 pounds., Disp: 1 each, Rfl: 0 .  morphine (ROXANOL) 20 MG/ML concentrated solution, Take 0.25 mLs (5 mg total) by mouth every 4 (four) hours as needed for shortness of breath., Disp: 30 mL, Rfl: 0 .  Multiple Vitamins-Minerals (MULTIVITAMIN WITH MINERALS) tablet, Take 1 tablet by mouth daily with breakfast. , Disp: , Rfl:  .  OXYGEN, Inhale 4 L/min into the lungs continuous., Disp: , Rfl:  .  polyethylene glycol (MIRALAX / GLYCOLAX) 17 g packet, Take 17 g by mouth daily as needed for moderate constipation., Disp: , Rfl:  .  potassium chloride SA (KLOR-CON) 20 MEQ tablet, Take 1 tablet (20 mEq total) by mouth daily for 7 days., Disp: 7 tablet, Rfl: 0 .  primidone (MYSOLINE) 50 MG tablet, Take 1 tablet (50 mg total) by mouth in the morning and at bedtime., Disp: 180 tablet, Rfl: 1 .  promethazine (PHENERGAN) 25 MG tablet, Take 1 tablet (25 mg total) by mouth every 8 (eight) hours as needed for nausea or vomiting., Disp: 30 tablet, Rfl: 1 .  SUPER B COMPLEX/C CAPS, Take 1 capsule by mouth daily. , Disp: , Rfl:  .  torsemide (DEMADEX) 20 MG tablet, Take 2 tablets (40 mg total) by mouth 2 (two) times daily., Disp: 120 tablet, Rfl: 3 .  Vitamin D, Ergocalciferol, (DRISDOL) 1.25 MG (50000 UNIT) CAPS capsule, Take 50,000 Units by mouth every Sunday. , Disp: , Rfl:  Allergies  Allergen Reactions  . Garlic Shortness Of Breath, Itching, Swelling and Other (See Comments)    "Raw garlic" = Hand itching and swelling  . Isosorbide Other (See Comments)    Sneezing and runny nose  . Latex Itching  . Morphine And Related Itching and Other (See Comments)    Headache   . Other Itching and Other (See Comments)    Reaction to newspaper ink- Headaches, also     Social History   Socioeconomic History  . Marital status: Widowed    Spouse name: Not on file  . Number of children: Not on file  . Years of education:  Not on file  . Highest education level: Not on file  Occupational History  . Not on file  Tobacco Use  . Smoking status: Never Smoker  . Smokeless tobacco: Never Used  Vaping Use  . Vaping Use: Never used  Substance and Sexual Activity  . Alcohol use: No  . Drug use: No  . Sexual activity: Not Currently    Birth control/protection: None  Other Topics Concern  . Not on file  Social History Narrative  . Not on file   Social Determinants of Health   Financial Resource Strain:   . Difficulty of Paying Living Expenses: Not on file  Food Insecurity: No Food Insecurity  . Worried About Charity fundraiser in the Last Year: Never true  . Ran Out of Food in the  Last Year: Never true  Transportation Needs:   . Lack of Transportation (Medical): Not on file  . Lack of Transportation (Non-Medical): Not on file  Physical Activity:   . Days of Exercise per Week: Not on file  . Minutes of Exercise per Session: Not on file  Stress:   . Feeling of Stress : Not on file  Social Connections:   . Frequency of Communication with Friends and Family: Not on file  . Frequency of Social Gatherings with Friends and Family: Not on file  . Attends Religious Services: Not on file  . Active Member of Clubs or Organizations: Not on file  . Attends Archivist Meetings: Not on file  . Marital Status: Not on file  Intimate Partner Violence:   . Fear of Current or Ex-Partner: Not on file  . Emotionally Abused: Not on file  . Physically Abused: Not on file  . Sexually Abused: Not on file    Physical Exam Vitals reviewed.  Constitutional:      Appearance: She is normal weight.  HENT:     Head: Normocephalic.     Nose: Nose normal.     Mouth/Throat:     Mouth: Mucous membranes are moist.  Eyes:     Pupils: Pupils are equal, round, and reactive to light.  Cardiovascular:     Rate and Rhythm: Normal rate and regular rhythm.     Pulses: Normal pulses.     Heart sounds: Normal heart  sounds.  Pulmonary:     Effort: Pulmonary effort is normal.     Breath sounds: Rhonchi present.  Abdominal:     General: Abdomen is flat.     Palpations: Abdomen is soft.  Musculoskeletal:        General: Normal range of motion.     Cervical back: Normal range of motion.     Right lower leg: No edema.     Left lower leg: No edema.  Skin:    General: Skin is warm and dry.     Capillary Refill: Capillary refill takes less than 2 seconds.  Neurological:     Mental Status: She is alert. Mental status is at baseline.  Psychiatric:        Mood and Affect: Mood normal.    Arrived for home visit for Gracelee who was alert and oriented seated upright in her wheel chair on her oxygen. Ashby reports she is having a good day today with no complaints of shortness of breath, dizziness, chest pain. Archie does report a mild cough with some mucus production. Lung sounds present with diminished lower lobes and congestion in the upper lobes. Sarina's daughter [name] noted she is now being seen by [facility] and was seen today by RN. I requested an updated med list to be sent to me to review with HF clinic. Patient's daughter is going to get this for me. I reviewed medication and what the patient's daughter reports she is taking currently and her list in Epic for todays visit reflects same. Vitals were obtained and are as noted. Karolee got a new rotating matress bed today as well as a new oxygen concentrator. Pill box filled.  I will be following up weekly with patient. Home visit complete.   Refills:  Torsemide Test Strips Lancets       Future Appointments  Date Time Provider [place]  12/16/2019 10:50 AM [name], MD [facility] None  01/29/2020  9:15 AM Tat, Eustace Quail, DO [facility] None  ACTION: Home visit completed Next visit planned for one week

## 2019-12-16 ENCOUNTER — Telehealth: Payer: Self-pay | Admitting: Family Medicine

## 2019-12-16 ENCOUNTER — Other Ambulatory Visit: Payer: Self-pay

## 2019-12-16 ENCOUNTER — Encounter: Payer: Self-pay | Admitting: Family Medicine

## 2019-12-16 ENCOUNTER — Ambulatory Visit: Payer: Medicare Other | Attending: Family Medicine | Admitting: Family Medicine

## 2019-12-16 DIAGNOSIS — N1832 Chronic kidney disease, stage 3b: Secondary | ICD-10-CM | POA: Diagnosis not present

## 2019-12-16 DIAGNOSIS — I110 Hypertensive heart disease with heart failure: Secondary | ICD-10-CM | POA: Diagnosis not present

## 2019-12-16 DIAGNOSIS — E1121 Type 2 diabetes mellitus with diabetic nephropathy: Secondary | ICD-10-CM | POA: Diagnosis not present

## 2019-12-16 DIAGNOSIS — Z794 Long term (current) use of insulin: Secondary | ICD-10-CM | POA: Diagnosis not present

## 2019-12-16 DIAGNOSIS — E1140 Type 2 diabetes mellitus with diabetic neuropathy, unspecified: Secondary | ICD-10-CM

## 2019-12-16 DIAGNOSIS — G4709 Other insomnia: Secondary | ICD-10-CM

## 2019-12-16 DIAGNOSIS — I5022 Chronic systolic (congestive) heart failure: Secondary | ICD-10-CM | POA: Diagnosis not present

## 2019-12-16 DIAGNOSIS — L299 Pruritus, unspecified: Secondary | ICD-10-CM

## 2019-12-16 MED ORDER — DULOXETINE HCL 30 MG PO CPEP: 30.0000 mg | ORAL_CAPSULE | Freq: Every day | ORAL | 3 refills | Status: DC

## 2019-12-16 MED ORDER — HYDROXYZINE HCL 25 MG PO TABS: 25.0000 mg | ORAL_TABLET | Freq: Two times a day (BID) | ORAL | 1 refills | Status: DC | PRN

## 2019-12-16 MED ORDER — POLYETHYLENE GLYCOL 3350 17 G PO PACK: 17.0000 g | PACK | Freq: Every day | ORAL | 1 refills | Status: DC | PRN

## 2019-12-16 NOTE — Telephone Encounter (Signed)
Fax has been submitted with pcp signature.

## 2019-12-16 NOTE — Progress Notes (Signed)
States that her entire body itches.

## 2019-12-16 NOTE — Progress Notes (Signed)
Virtual Visit via Telephone Note  I connected with Renay Crammer, on 12/16/2019 at 11:14 AM by telephone due to the COVID-19 pandemic and verified that I am speaking with the correct person using two identifiers.   Consent: I discussed the limitations, risks, security and privacy concerns of performing an evaluation and management service by telephone and the availability of in person appointments. I also discussed with the patient that there may be a patient responsible charge related to this service. The patient expressed understanding and agreed to proceed.   Location of Patient: Home  Location of Provider: Clinic   Persons participating in Telemedicine visit: Narely Nobles Farrington-CMA Dr. Patrina Levering - daughter     History of Present Illness: Kara Hanson is a 64 year old female with a history of type 2 diabetes mellitus (A1c 7.4), hypertension, congestive heart failure (EF20 to 25% from echo of 09/2019 down from 40-45% from echo of1/2021,chronic respiratory failure on 8 L of oxygen,COVID-19 related pneumonia in 07/2018,multiple hospitalizations for CHF exacerbation , currently under home hospice seen for a follow-upvisit at the transitional care visit after hospitalization for acute respiratory failure secondary to CHF exacerbation at [facility] from 11/27/2019 through 12/02/2019.  Today she reports doing well. Her oxygen is humidified and she can breathe better and she is on 8L of oxygen even though she was discharged on 4 L. She has an Aid everyday but she lives with her daughter and there is always someone at home with her. She still does not have a Glucometer. I had prescribed continuous glucose monitoring for her which she is yet to receive from the DME company.  Denies presence of hypoglycemic symptoms. Neuropathy is uncontrolled on Gabapentin and review of her medication list indicates she should be on Cymbalta however her daughter states  hospice had advised her to discontinue this. Primidone has not been effective with regards to her tremor and she had reached out to Dr. Carles Collet her neurologist regarding this.  Complains of itching in her hips and her back , sides and this occurs through the day and she describes this as a burning itch. Cortisone works some.  Allegra worked in the past but currently has been ineffective..  Denies the presence of a rash. She is unable to walk as her knees are wobbly and her walker cannot support her and so she uses her wheelchair most of the time. Denies presence of pedal edema  Also has insomnia.  Past Medical History:  Diagnosis Date  . Acute CHF (congestive heart failure) ([place]) 09/24/2018  . Acute on chronic combined systolic and diastolic CHF (congestive heart failure) ([place]) 11/27/2017  . Acute on chronic respiratory failure with hypoxia ([place]) 03/20/2018  . Acute renal failure superimposed on stage 3 chronic kidney disease ([place])   . Acute respiratory distress 11/27/2017  . Acute respiratory failure with hypoxia ([place]) 02/09/2019  . Acute systolic CHF (congestive heart failure) ([place]) 05/19/2019  . AKI (acute kidney injury) ([place]) 11/27/2017  . Atelectasis   . Bulging lumbar disc 11/14/2017  . CAP (community acquired pneumonia) 10/31/2018  . Chest pain 06/17/2016  . CHF (congestive heart failure) ([place])   . Chronic diastolic CHF (congestive heart failure) ([place]) 07/20/2018  . Chronic respiratory failure ([place])   . CKD (chronic kidney disease), stage III 03/20/2018  . Controlled type 2 diabetes mellitus with hyperglycemia ([place]) 04/23/2016  . Controlled type 2 diabetes with neuropathy ([place]) 03/20/2018  . COVID-19 virus infection 07/20/2018  . CVA (cerebral vascular accident) ([place])   .  Diabetes mellitus without complication ([place])   . Dyspnea   . Elevated troponin 04/18/2019  . Epigastric pain   . Gout 11/14/2017  . Hypercholesteremia   . Hyperlipidemia LDL goal <70 09/30/2016  . Hypertension   .  Myocardial infarction ([place])   . Normochromic normocytic anemia 04/23/2016  . NSTEMI (non-ST elevated myocardial infarction) ([place]) 02/09/2019  . Pneumonia 11/01/2018  . Pneumonia due to COVID-19 virus 07/20/2018  . S/P thoracentesis   . SIRS (systemic inflammatory response syndrome) ([place]) 07/25/2018  . Spinal stenosis   . Stress-induced cardiomyopathy 05/19/2016  . Syncope 04/23/2016  . Type 2 diabetes mellitus with diabetic neuropathy, unspecified ([place]) 05/06/2016  . Vertigo 05/06/2016  . Vitamin D deficiency 04/02/2018   Allergies  Allergen Reactions  . Garlic Shortness Of Breath, Itching, Swelling and Other (See Comments)    "Raw garlic" = Hand itching and swelling  . Isosorbide Other (See Comments)    Sneezing and runny nose  . Latex Itching  . Morphine And Related Itching and Other (See Comments)    Headache   . Other Itching and Other (See Comments)    Reaction to newspaper ink- Headaches, also    Current Outpatient Medications on File Prior to Visit  Medication Sig Dispense Refill  . acetaminophen (TYLENOL) 500 MG tablet Take 500-1,000 mg by mouth every 6 (six) hours as needed for mild pain or headache.    . allopurinol (ZYLOPRIM) 100 MG tablet Take 2 tablets (200 mg total) by mouth at bedtime. 30 tablet 1  . fluticasone (FLONASE) 50 MCG/ACT nasal spray Place 2 sprays into both nostrils daily as needed.     . gabapentin (NEURONTIN) 100 MG capsule TAKE 1 CAPSULE (100 MG TOTAL) BY MOUTH 2 (TWO) TIMES DAILY. (Patient taking differently: Take 100 mg by mouth 2 (two) times daily. ) 60 capsule 2  . LORazepam (ATIVAN) 0.5 MG tablet Take 1 tablet (0.5 mg total) by mouth every 4 (four) hours as needed for anxiety. May crush, mix with water and give sublingually if needed. 42 tablet 0  . Misc. Devices MISC Portable oxygen concentrator.  Diagnosis-chronic respiratory failure. 1 each 0  . Misc. Devices MISC Rollaor with seat. Dx: Congestive Heart Failure 1 each 0  . Misc. Devices MISC Scale; Dx  - CHF 1 each 0  . Misc. [place] [facility] bed.  Diagnosis CHF.  Lifetime use.  Weight 165 lbs. 1 each 0  . Misc. Devices [place] Shower chair DX CHF 1 each 0  . Misc. Devices MISC Manual wheelchai with cushion, anti - tippers, brakes and lock.  Diagnosis congestive heart failure.  Weight 140 pounds. 1 each 0  . Multiple Vitamins-Minerals (MULTIVITAMIN WITH MINERALS) tablet Take 1 tablet by mouth daily with breakfast.     . OXYGEN Inhale 4 L/min into the lungs continuous.    . primidone (MYSOLINE) 50 MG tablet Take 1 tablet (50 mg total) by mouth in the morning and at bedtime. 180 tablet 1  . promethazine (PHENERGAN) 25 MG tablet Take 1 tablet (25 mg total) by mouth every 8 (eight) hours as needed for nausea or vomiting. 30 tablet 1  . torsemide (DEMADEX) 20 MG tablet Take 2 tablets (40 mg total) by mouth 2 (two) times daily. 120 tablet 3  . aspirin EC 81 MG tablet Take 81 mg by mouth in the morning.  (Patient not taking: Reported on 12/11/2019)    . atorvastatin (LIPITOR) 80 MG tablet Take 80 mg by mouth at bedtime.  (Patient not  taking: Reported on 12/11/2019)    . DULoxetine (CYMBALTA) 30 MG capsule Take 1 capsule (30 mg total) by mouth daily. (Patient not taking: Reported on 12/11/2019) 30 capsule 3  . hydrALAZINE (APRESOLINE) 25 MG tablet Take 1 tablet (25 mg total) by mouth 3 (three) times daily. (Patient not taking: Reported on 12/11/2019) 90 tablet 0  . lansoprazole (PREVACID) 15 MG capsule TAKE 1 CAPSULE (15 MG TOTAL) BY MOUTH DAILY. (Patient not taking: Reported on 12/11/2019) 30 capsule 2  . morphine (ROXANOL) 20 MG/ML concentrated solution Take 0.25 mLs (5 mg total) by mouth every 4 (four) hours as needed for shortness of breath. (Patient not taking: Reported on 12/11/2019) 30 mL 0  . polyethylene glycol (MIRALAX / GLYCOLAX) 17 g packet Take 17 g by mouth daily as needed for moderate constipation. (Patient not taking: Reported on 12/11/2019)    . potassium chloride SA (KLOR-CON) 20 MEQ tablet Take 1  tablet (20 mEq total) by mouth daily for 7 days. (Patient not taking: Reported on 12/11/2019) 7 tablet 0  . SUPER B COMPLEX/C CAPS Take 1 capsule by mouth daily.  (Patient not taking: Reported on 12/11/2019)    . Vitamin D, Ergocalciferol, (DRISDOL) 1.25 MG (50000 UNIT) CAPS capsule Take 50,000 Units by mouth every Sunday.  (Patient not taking: Reported on 12/11/2019)     No current facility-administered medications on file prior to visit.    Observations/Objective: Awake, alert, oriented x3 Not in acute distress  Lab Results  Component Value Date   HGBA1C 6.1 (H) 11/27/2019     CMP Latest Ref Rng & Units 12/02/2019 12/01/2019 11/30/2019  Glucose 70 - 99 mg/dL 141(H) 133(H) 105(H)  BUN 8 - 23 mg/dL 38(H) 36(H) 35(H)  Creatinine 0.44 - 1.00 mg/dL 2.14(H) 2.06(H) 1.94(H)  Sodium 135 - 145 mmol/L 140 139 140  Potassium 3.5 - 5.1 mmol/L 4.9 4.5 4.3  Chloride 98 - 111 mmol/L 108 106 107  CO2 22 - 32 mmol/L 24 22 22   Calcium 8.9 - 10.3 mg/dL 9.5 9.6 9.5  Total Protein 6.5 - 8.1 g/dL - - -  Total Bilirubin 0.3 - 1.2 mg/dL - - -  Alkaline Phos 38 - 126 U/L - - -  AST 15 - 41 U/L - - -  ALT 0 - 44 U/L - - -     Assessment and Plan: 1. Type 2 diabetes mellitus with stage 3b chronic kidney disease, with long-term current use of insulin ([place]) Controlled with A1c of 6.1 We have called the DME company regarding the hold-up on her CGM and hopefully they should be sending her Glucometer tomorrow Continue current regimen  2. Hypertensive heart disease with chronic systolic congestive heart failure (HCC) EF of 40 to 45% Euvolemic Continue current dose of Lasix She is concerned that she was taken off potassium by hospice It does appear that there is some confusion with regards to her medications prescribed and the list from hospice She is currently followed by the para medicine program and I will have the Paramedicine EMT reconcile her medications and call the clinic back with this  information  3. Type 2 diabetes mellitus with diabetic neuropathy, with long-term current use of insulin (HCC) Uncontrolled on gabapentin Restarted Cymbalta - DULoxetine (CYMBALTA) 30 MG capsule; Take 1 capsule (30 mg total) by mouth daily.  Dispense: 30 capsule; Refill: 3  4. Other insomnia She is currently on gabapentin which I have advised her to use at night in addition of hydroxyzine should also be beneficial.  Holding off on placing her on too many sedatives given her underlying chronic conditions She was also placed on lorazepam by hospice and I have advised her that this should be used only as needed as she has been using it round-the-clock it is not indicated.  5. Pruritus Denies presence of rash Dry skin could be contributing Advised to use moisturizers Use hydroxyzine and advised of sedating side effects - hydrOXYzine (ATARAX/VISTARIL) 25 MG tablet; Take 1 tablet (25 mg total) by mouth 2 (two) times daily as needed for itching.  Dispense: 60 tablet; Refill: 1   Follow Up Instructions: 3 months for chronic disease management.   I discussed the assessment and treatment plan with the patient. The patient was provided an opportunity to ask questions and all were answered. The patient agreed with the plan and demonstrated an understanding of the instructions.   The patient was advised to call back or seek an in-person evaluation if the symptoms worsen or if the condition fails to improve as anticipated.     I provided 28 minutes total of non-face-to-face time during this encounter including median intraservice time, reviewing previous notes, investigations, ordering medications, medical decision making, coordinating care and patient verbalized understanding at the end of the visit.     [name], MD, FAAFP. [facility] and [place], [place]   12/16/2019, 11:14 AM

## 2019-12-16 NOTE — Telephone Encounter (Signed)
Copied from [place] [id number]. Topic: General - Other >> Dec 13, 2019  4:24 PM Leward Quan A wrote: Reason for CRM: Blanch Media with [employer] called to inform Dr Margarita Rana that there was a form prefilled faxed over that is needed to be initialed on #[phone] and fax back to  Fax# [phone] Ph# [phone]

## 2019-12-18 ENCOUNTER — Other Ambulatory Visit (HOSPITAL_COMMUNITY): Payer: Self-pay

## 2019-12-18 NOTE — Progress Notes (Signed)
Paramedicine Encounter    Patient ID: Kara Hanson, female    DOB: 01-28-1956, 64 y.o.   MRN: [id number]   Patient Care Team: [name], MD as PCP - General (Family Medicine) Jerline Pain, MD as PCP - Cardiology (Cardiology)  Patient Active Problem List   Diagnosis Date Noted   DNR (do not resuscitate)    DNI (do not intubate)    Advanced directives, counseling/discussion    Shortness of breath    Abdominal pain 11/19/2019   CKD (chronic kidney disease), stage IV ([place]) 11/19/2019   Palliative care by specialist    Goals of care, counseling/discussion    Cardiorenal syndrome    Hyponatremia 09/26/2019   CHF exacerbation ([place]) 09/26/2019   Acute on chronic systolic CHF (congestive heart failure) ([place]) 05/19/2019   Elevated troponin 04/18/2019   NSTEMI (non-ST elevated myocardial infarction) ([place]) 02/09/2019   S/P thoracentesis    Atelectasis    Chronic respiratory failure ([place])    CAP (community acquired pneumonia) 10/31/2018   COVID-19 virus infection 07/20/2018   Pneumonia due to COVID-19 virus 07/20/2018   Vitamin D deficiency 04/02/2018   Spinal stenosis 03/29/2018   Acute on chronic respiratory failure with hypoxia ([place]) 03/20/2018   Controlled type 2 diabetes with neuropathy ([place]) 03/20/2018   Acute on chronic combined systolic and diastolic CHF (congestive heart failure) ([place]) 11/27/2017   AKI (acute kidney injury) ([place]) 11/27/2017   Acute respiratory distress 11/27/2017   Bulging lumbar disc 11/14/2017   Gout 11/14/2017   Hyperlipidemia LDL goal <70 09/30/2016   Chest pain 06/17/2016   Stress-induced cardiomyopathy 05/19/2016   Type 2 diabetes mellitus with diabetic neuropathy, unspecified ([place]) 05/06/2016   Vertigo 05/06/2016   Controlled type 2 diabetes mellitus with hyperglycemia ([place]) 04/23/2016   Hypertension 04/23/2016   Normochromic normocytic anemia 04/23/2016   Syncope 04/23/2016    Current Outpatient  Medications:    acetaminophen (TYLENOL) 500 MG tablet, Take 500-1,000 mg by mouth every 6 (six) hours as needed for mild pain or headache., Disp: , Rfl:    allopurinol (ZYLOPRIM) 100 MG tablet, Take 2 tablets (200 mg total) by mouth at bedtime., Disp: 30 tablet, Rfl: 1   aspirin EC 81 MG tablet, Take 81 mg by mouth in the morning.  (Patient not taking: Reported on 12/11/2019), Disp: , Rfl:    atorvastatin (LIPITOR) 80 MG tablet, Take 80 mg by mouth at bedtime.  (Patient not taking: Reported on 12/11/2019), Disp: , Rfl:    DULoxetine (CYMBALTA) 30 MG capsule, Take 1 capsule (30 mg total) by mouth daily., Disp: 30 capsule, Rfl: 3   fluticasone (FLONASE) 50 MCG/ACT nasal spray, Place 2 sprays into both nostrils daily as needed. , Disp: , Rfl:    gabapentin (NEURONTIN) 100 MG capsule, TAKE 1 CAPSULE (100 MG TOTAL) BY MOUTH 2 (TWO) TIMES DAILY. (Patient taking differently: Take 100 mg by mouth 2 (two) times daily. ), Disp: 60 capsule, Rfl: 2   hydrALAZINE (APRESOLINE) 25 MG tablet, Take 1 tablet (25 mg total) by mouth 3 (three) times daily. (Patient not taking: Reported on 12/11/2019), Disp: 90 tablet, Rfl: 0   hydrOXYzine (ATARAX/VISTARIL) 25 MG tablet, Take 1 tablet (25 mg total) by mouth 2 (two) times daily as needed for itching., Disp: 60 tablet, Rfl: 1   lansoprazole (PREVACID) 15 MG capsule, TAKE 1 CAPSULE (15 MG TOTAL) BY MOUTH DAILY. (Patient not taking: Reported on 12/11/2019), Disp: 30 capsule, Rfl: 2   LORazepam (ATIVAN) 0.5 MG tablet, Take 1 tablet (0.5  mg total) by mouth every 4 (four) hours as needed for anxiety. May crush, mix with water and give sublingually if needed., Disp: 42 tablet, Rfl: 0   Misc. Devices MISC, Portable oxygen concentrator.  Diagnosis-chronic respiratory failure., Disp: 1 each, Rfl: 0   Misc. Devices MISC, Rollaor with seat. Dx: Congestive Heart Failure, Disp: 1 each, Rfl: 0   Misc. Devices MISC, Scale; Dx - CHF, Disp: 1 each, Rfl: 0   Misc. Devices [place] Hospital bed.  Diagnosis CHF.  Lifetime use.  Weight 165 lbs., Disp: 1 each, Rfl: 0   Misc. Devices MISC, Shower chair DX CHF, Disp: 1 each, Rfl: 0   Misc. Devices MISC, Manual wheelchai with cushion, anti - tippers, brakes and lock.  Diagnosis congestive heart failure.  Weight 140 pounds., Disp: 1 each, Rfl: 0   morphine (ROXANOL) 20 MG/ML concentrated solution, Take 0.25 mLs (5 mg total) by mouth every 4 (four) hours as needed for shortness of breath. (Patient not taking: Reported on 12/11/2019), Disp: 30 mL, Rfl: 0   Multiple Vitamins-Minerals (MULTIVITAMIN WITH MINERALS) tablet, Take 1 tablet by mouth daily with breakfast. , Disp: , Rfl:    OXYGEN, Inhale 4 L/min into the lungs continuous., Disp: , Rfl:    polyethylene glycol (MIRALAX / GLYCOLAX) 17 g packet, Take 17 g by mouth daily as needed for moderate constipation., Disp: 30 each, Rfl: 1   potassium chloride SA (KLOR-CON) 20 MEQ tablet, Take 1 tablet (20 mEq total) by mouth daily for 7 days. (Patient not taking: Reported on 12/11/2019), Disp: 7 tablet, Rfl: 0   primidone (MYSOLINE) 50 MG tablet, Take 1 tablet (50 mg total) by mouth in the morning and at bedtime., Disp: 180 tablet, Rfl: 1   promethazine (PHENERGAN) 25 MG tablet, Take 1 tablet (25 mg total) by mouth every 8 (eight) hours as needed for nausea or vomiting., Disp: 30 tablet, Rfl: 1   SUPER B COMPLEX/C CAPS, Take 1 capsule by mouth daily.  (Patient not taking: Reported on 12/11/2019), Disp: , Rfl:    torsemide (DEMADEX) 20 MG tablet, Take 2 tablets (40 mg total) by mouth 2 (two) times daily., Disp: 120 tablet, Rfl: 3   Vitamin D, Ergocalciferol, (DRISDOL) 1.25 MG (50000 UNIT) CAPS capsule, Take 50,000 Units by mouth every Sunday.  (Patient not taking: Reported on 12/11/2019), Disp: , Rfl:  Allergies  Allergen Reactions   Garlic Shortness Of Breath, Itching, Swelling and Other (See Comments)    "Raw garlic" = Hand itching and swelling   Isosorbide Other (See Comments)     Sneezing and runny nose   Latex Itching   Morphine And Related Itching and Other (See Comments)    Headache    Other Itching and Other (See Comments)    Reaction to newspaper ink- Headaches, also     Social History   Socioeconomic History   Marital status: Widowed    Spouse name: Not on file   Number of children: Not on file   Years of education: Not on file   Highest education level: Not on file  Occupational History   Not on file  Tobacco Use   Smoking status: Never Smoker   Smokeless tobacco: Never Used  Vaping Use   Vaping Use: Never used  Substance and Sexual Activity   Alcohol use: No   Drug use: No   Sexual activity: Not Currently    Birth control/protection: None  Other Topics Concern   Not on file  Social History Narrative  Not on file   Social Determinants of Health   Financial Resource Strain:    Difficulty of Paying Living Expenses: Not on file  Food Insecurity: No Food Insecurity   Worried About [place] in the Last Year: Never true   Ran Out of Food in the Last Year: Never true  Transportation Needs:    Lack of Transportation (Medical): Not on file   Lack of Transportation (Non-Medical): Not on file  Physical Activity:    Days of Exercise per Week: Not on file   Minutes of Exercise per Session: Not on file  Stress:    Feeling of Stress : Not on file  Social Connections:    Frequency of Communication with Friends and Family: Not on file   Frequency of Social Gatherings with Friends and Family: Not on file   Attends Religious Services: Not on file   Active Member of Clubs or Organizations: Not on file   Attends Archivist Meetings: Not on file   Marital Status: Not on file  Intimate Partner Violence:    Fear of Current or Ex-Partner: Not on file   Emotionally Abused: Not on file   Physically Abused: Not on file   Sexually Abused: Not on file    Physical Exam Vitals reviewed.   Constitutional:      Appearance: She is normal weight.  HENT:     Head: Normocephalic.     Nose: Nose normal.     Mouth/Throat:     Mouth: Mucous membranes are moist.     Pharynx: Oropharynx is clear.  Eyes:     Pupils: Pupils are equal, round, and reactive to light.  Cardiovascular:     Rate and Rhythm: Normal rate and regular rhythm.     Pulses: Normal pulses.     Heart sounds: Normal heart sounds.  Pulmonary:     Breath sounds: Wheezing present.  Abdominal:     General: Abdomen is flat.     Palpations: Abdomen is soft.  Musculoskeletal:        General: Normal range of motion.     Right lower leg: No edema.     Left lower leg: No edema.  Skin:    General: Skin is warm and dry.     Capillary Refill: Capillary refill takes less than 2 seconds.  Neurological:     Mental Status: She is alert. Mental status is at baseline.  Psychiatric:        Mood and Affect: Mood normal.     Arrived for home visit for Ataya who was alert and oriented laying in bed with her home aide [place] at bedside. Mattye reports she is doing well and is feeling good. Anyelin stated she talked with Dr. Margarita Rana on Monday with virtual visit and explained she was having some itching and Dr. Margarita Rana prescribed Hydroxyzine in which she began taking today and was having some GI upset following taking the medication. Jaelani reports she does not want to take this medication for this anymore. I reviewed medications and attempted to call [facility] who did not answer at the time but I left a voice mail for return of call to reconcile medication list. I spoke to HF clinic who verified it was okay to continue torsemide. I placed medications in pill box based off epic list and last  list from hospice. I will reconcile meds with RN as soon as I hear from her. Vitals were obtained and are as noted.  Kiaja reports a chaplin came out today and spent some time with her and she really enjoyed this. I will continue to follow  patient until meds are reconciled with hospice and plan of care is in place. Home visit complete I will be out in one week.     Future Appointments  Date Time Provider Lucky  01/29/2020  9:15 AM Tat, Eustace Quail, DO [facility] None     ACTION: Home visit completed Next visit planned for one week

## 2019-12-19 ENCOUNTER — Telehealth (HOSPITAL_COMMUNITY): Payer: Self-pay

## 2019-12-19 NOTE — Telephone Encounter (Signed)
Spoke to RN with Hospice who is seeing Kara Hanson under Dr. Clemmie Krill who reconciled medications today comparing lists on Epic and their assigned med list. Currently per hospice patient is to only take the following.   -Cymbalta 30mg  daily  -Gabapentin 200mg  BID -Lorazepam PRN -Torsemide 20mg  BID  -Senna 8.6mg  daily   I will pass along to PCP and HF Clinic.    Meds will reflect same in pill box.

## 2019-12-20 ENCOUNTER — Telehealth: Payer: Self-pay

## 2019-12-20 NOTE — Telephone Encounter (Signed)
Accessed chart for [place] Audit Investigation.

## 2019-12-25 ENCOUNTER — Other Ambulatory Visit: Payer: Self-pay | Admitting: Family Medicine

## 2019-12-25 ENCOUNTER — Other Ambulatory Visit: Payer: Self-pay

## 2019-12-25 MED ORDER — BENZONATATE 100 MG PO CAPS: 100.0000 mg | ORAL_CAPSULE | Freq: Two times a day (BID) | ORAL | 0 refills | Status: DC | PRN

## 2019-12-25 NOTE — Progress Notes (Signed)
Paramedicine Encounter    Patient ID: Kara Hanson, female    DOB: 03-12-1956, 64 y.o.   MRN: [id number]   Patient Care Team: [name], MD as PCP - General (Family Medicine) Jerline Pain, MD as PCP - Cardiology (Cardiology)  Patient Active Problem List   Diagnosis Date Noted  . DNR (do not resuscitate)   . DNI (do not intubate)   . Advanced directives, counseling/discussion   . Shortness of breath   . Abdominal pain 11/19/2019  . CKD (chronic kidney disease), stage IV ([place]) 11/19/2019  . Palliative care by specialist   . Goals of care, counseling/discussion   . Cardiorenal syndrome   . Hyponatremia 09/26/2019  . CHF exacerbation ([place]) 09/26/2019  . Acute on chronic systolic CHF (congestive heart failure) ([place]) 05/19/2019  . Elevated troponin 04/18/2019  . NSTEMI (non-ST elevated myocardial infarction) ([place]) 02/09/2019  . S/P thoracentesis   . Atelectasis   . Chronic respiratory failure ([place])   . CAP (community acquired pneumonia) 10/31/2018  . COVID-19 virus infection 07/20/2018  . Pneumonia due to COVID-19 virus 07/20/2018  . Vitamin D deficiency 04/02/2018  . Spinal stenosis 03/29/2018  . Acute on chronic respiratory failure with hypoxia ([place]) 03/20/2018  . Controlled type 2 diabetes with neuropathy ([place]) 03/20/2018  . Acute on chronic combined systolic and diastolic CHF (congestive heart failure) ([place]) 11/27/2017  . AKI (acute kidney injury) ([place]) 11/27/2017  . Acute respiratory distress 11/27/2017  . Bulging lumbar disc 11/14/2017  . Gout 11/14/2017  . Hyperlipidemia LDL goal <70 09/30/2016  . Chest pain 06/17/2016  . Stress-induced cardiomyopathy 05/19/2016  . Type 2 diabetes mellitus with diabetic neuropathy, unspecified ([place]) 05/06/2016  . Vertigo 05/06/2016  . Controlled type 2 diabetes mellitus with hyperglycemia ([place]) 04/23/2016  . Hypertension 04/23/2016  . Normochromic normocytic anemia 04/23/2016  . Syncope 04/23/2016    Current Outpatient  Medications:  .  acetaminophen (TYLENOL) 500 MG tablet, Take 500-1,000 mg by mouth every 6 (six) hours as needed for mild pain or headache. (Patient not taking: Reported on 12/18/2019), Disp: , Rfl:  .  allopurinol (ZYLOPRIM) 100 MG tablet, Take 2 tablets (200 mg total) by mouth at bedtime., Disp: 30 tablet, Rfl: 1 .  aspirin EC 81 MG tablet, Take 81 mg by mouth in the morning.  (Patient not taking: Reported on 12/11/2019), Disp: , Rfl:  .  atorvastatin (LIPITOR) 80 MG tablet, Take 80 mg by mouth at bedtime.  (Patient not taking: Reported on 12/11/2019), Disp: , Rfl:  .  DULoxetine (CYMBALTA) 30 MG capsule, Take 1 capsule (30 mg total) by mouth daily., Disp: 30 capsule, Rfl: 3 .  fluticasone (FLONASE) 50 MCG/ACT nasal spray, Place 2 sprays into both nostrils daily as needed.  (Patient not taking: Reported on 12/18/2019), Disp: , Rfl:  .  gabapentin (NEURONTIN) 100 MG capsule, TAKE 1 CAPSULE (100 MG TOTAL) BY MOUTH 2 (TWO) TIMES DAILY. (Patient taking differently: Take 100 mg by mouth 2 (two) times daily. ), Disp: 60 capsule, Rfl: 2 .  hydrALAZINE (APRESOLINE) 25 MG tablet, Take 1 tablet (25 mg total) by mouth 3 (three) times daily. (Patient not taking: Reported on 12/11/2019), Disp: 90 tablet, Rfl: 0 .  hydrOXYzine (ATARAX/VISTARIL) 25 MG tablet, Take 1 tablet (25 mg total) by mouth 2 (two) times daily as needed for itching. (Patient not taking: Reported on 12/18/2019), Disp: 60 tablet, Rfl: 1 .  lansoprazole (PREVACID) 15 MG capsule, TAKE 1 CAPSULE (15 MG TOTAL) BY MOUTH DAILY., Disp: 30 capsule, Rfl:  2 .  LORazepam (ATIVAN) 0.5 MG tablet, Take 1 tablet (0.5 mg total) by mouth every 4 (four) hours as needed for anxiety. May crush, mix with water and give sublingually if needed., Disp: 42 tablet, Rfl: 0 .  Misc. Devices MISC, Portable oxygen concentrator.  Diagnosis-chronic respiratory failure., Disp: 1 each, Rfl: 0 .  Misc. Devices MISC, Rollaor with seat. Dx: Congestive Heart Failure, Disp: 1 each, Rfl: 0 .   Misc. Devices MISC, Scale; Dx - CHF, Disp: 1 each, Rfl: 0 .  Misc. Devices [facility] bed.  Diagnosis CHF.  Lifetime use.  Weight 165 lbs., Disp: 1 each, Rfl: 0 .  Misc. Devices MISC, Shower chair DX CHF, Disp: 1 each, Rfl: 0 .  Misc. Devices MISC, Manual wheelchai with cushion, anti - tippers, brakes and lock.  Diagnosis congestive heart failure.  Weight 140 pounds., Disp: 1 each, Rfl: 0 .  morphine (ROXANOL) 20 MG/ML concentrated solution, Take 0.25 mLs (5 mg total) by mouth every 4 (four) hours as needed for shortness of breath. (Patient not taking: Reported on 12/11/2019), Disp: 30 mL, Rfl: 0 .  Multiple Vitamins-Minerals (MULTIVITAMIN WITH MINERALS) tablet, Take 1 tablet by mouth daily with breakfast. , Disp: , Rfl:  .  OXYGEN, Inhale 4 L/min into the lungs continuous., Disp: , Rfl:  .  polyethylene glycol (MIRALAX / GLYCOLAX) 17 g packet, Take 17 g by mouth daily as needed for moderate constipation. (Patient not taking: Reported on 12/18/2019), Disp: 30 each, Rfl: 1 .  potassium chloride SA (KLOR-CON) 20 MEQ tablet, Take 1 tablet (20 mEq total) by mouth daily for 7 days. (Patient not taking: Reported on 12/11/2019), Disp: 7 tablet, Rfl: 0 .  primidone (MYSOLINE) 50 MG tablet, Take 1 tablet (50 mg total) by mouth in the morning and at bedtime., Disp: 180 tablet, Rfl: 1 .  promethazine (PHENERGAN) 25 MG tablet, Take 1 tablet (25 mg total) by mouth every 8 (eight) hours as needed for nausea or vomiting., Disp: 30 tablet, Rfl: 1 .  SUPER B COMPLEX/C CAPS, Take 1 capsule by mouth daily.  (Patient not taking: Reported on 12/11/2019), Disp: , Rfl:  .  torsemide (DEMADEX) 20 MG tablet, Take 2 tablets (40 mg total) by mouth 2 (two) times daily., Disp: 120 tablet, Rfl: 3 .  Vitamin D, Ergocalciferol, (DRISDOL) 1.25 MG (50000 UNIT) CAPS capsule, Take 50,000 Units by mouth every Sunday.  (Patient not taking: Reported on 12/11/2019), Disp: , Rfl:  Allergies  Allergen Reactions  . Garlic Shortness Of Breath,  Itching, Swelling and Other (See Comments)    "Raw garlic" = Hand itching and swelling  . Isosorbide Other (See Comments)    Sneezing and runny nose  . Latex Itching  . Morphine And Related Itching and Other (See Comments)    Headache   . Other Itching and Other (See Comments)    Reaction to newspaper ink- Headaches, also     Social History   Socioeconomic History  . Marital status: Widowed    Spouse name: Not on file  . Number of children: Not on file  . Years of education: Not on file  . Highest education level: Not on file  Occupational History  . Not on file  Tobacco Use  . Smoking status: Never Smoker  . Smokeless tobacco: Never Used  Vaping Use  . Vaping Use: Never used  Substance and Sexual Activity  . Alcohol use: No  . Drug use: No  . Sexual activity: Not Currently  Birth control/protection: None  Other Topics Concern  . Not on file  Social History Narrative  . Not on file   Social Determinants of Health   Financial Resource Strain:   . Difficulty of Paying Living Expenses: Not on file  Food Insecurity: No Food Insecurity  . Worried About Charity fundraiser in the Last Year: Never true  . Ran Out of Food in the Last Year: Never true  Transportation Needs:   . Lack of Transportation (Medical): Not on file  . Lack of Transportation (Non-Medical): Not on file  Physical Activity:   . Days of Exercise per Week: Not on file  . Minutes of Exercise per Session: Not on file  Stress:   . Feeling of Stress : Not on file  Social Connections:   . Frequency of Communication with Friends and Family: Not on file  . Frequency of Social Gatherings with Friends and Family: Not on file  . Attends Religious Services: Not on file  . Active Member of Clubs or Organizations: Not on file  . Attends Archivist Meetings: Not on file  . Marital Status: Not on file  Intimate Partner Violence:   . Fear of Current or Ex-Partner: Not on file  . Emotionally Abused:  Not on file  . Physically Abused: Not on file  . Sexually Abused: Not on file    Physical Exam Vitals reviewed.  Constitutional:      Appearance: She is normal weight.  HENT:     Head: Normocephalic.     Nose: Congestion present.     Mouth/Throat:     Pharynx: Oropharynx is clear.  Eyes:     Pupils: Pupils are equal, round, and reactive to light.  Cardiovascular:     Rate and Rhythm: Normal rate and regular rhythm.     Pulses: Normal pulses.     Heart sounds: Normal heart sounds.  Pulmonary:     Effort: Pulmonary effort is normal.     Breath sounds: Normal breath sounds.  Abdominal:     Palpations: Abdomen is soft.  Musculoskeletal:        General: Normal range of motion.     Cervical back: Normal range of motion.  Skin:    General: Skin is warm and dry.     Capillary Refill: Capillary refill takes less than 2 seconds.  Neurological:     Mental Status: She is alert. Mental status is at baseline.  Psychiatric:        Mood and Affect: Mood normal.     Arrived for home visit for Kara Hanson who was alert laying in her hospital bed in her room reporting she has had a productive cough for over a week. Lung sounds were clear with some congestion upon coughing. I messaged Dr. Margarita Rana about same and she prescribed tessalon pearls. Vitals obtained. Medications confirmed and pill box filled. Kara Hanson was made aware of medications in box.I reviewed same with daughter and advised them to add Senna Docustate to daily regimen per Hospice RN. Both verbalized understanding. Home visit complete. I will see Kara Hanson in one week.  Refills- Torsemide and Gabapentin     Future Appointments  Date Time Provider [place]  01/29/2020  9:15 AM Tat, Eustace Quail, DO [facility] None     ACTION: Home visit completed Next visit planned for one week

## 2020-01-01 ENCOUNTER — Other Ambulatory Visit (HOSPITAL_COMMUNITY): Payer: Self-pay

## 2020-01-01 ENCOUNTER — Inpatient Hospital Stay: Payer: Medicare Other | Admitting: Physician Assistant

## 2020-01-01 NOTE — Progress Notes (Signed)
Paramedicine Encounter    Patient ID: Kara Hanson, female    DOB: [birth year], 64 y.o.   MRN: [id number]   Patient Care Team: [name], MD as PCP - General (Family Medicine) Jerline Pain, MD as PCP - Cardiology (Cardiology)  Patient Active Problem List   Diagnosis Date Noted  . DNR (do not resuscitate)   . DNI (do not intubate)   . Advanced directives, counseling/discussion   . Shortness of breath   . Abdominal pain 11/19/2019  . CKD (chronic kidney disease), stage IV ([place]) 11/19/2019  . Palliative care by specialist   . Goals of care, counseling/discussion   . Cardiorenal syndrome   . Hyponatremia 09/26/2019  . CHF exacerbation ([place]) 09/26/2019  . Acute on chronic systolic CHF (congestive heart failure) ([place]) 05/19/2019  . Elevated troponin 04/18/2019  . NSTEMI (non-ST elevated myocardial infarction) ([place]) 02/09/2019  . S/P thoracentesis   . Atelectasis   . Chronic respiratory failure ([place])   . CAP (community acquired pneumonia) 10/31/2018  . COVID-19 virus infection 07/20/2018  . Pneumonia due to COVID-19 virus 07/20/2018  . Vitamin D deficiency 04/02/2018  . Spinal stenosis 03/29/2018  . Acute on chronic respiratory failure with hypoxia ([place]) 03/20/2018  . Controlled type 2 diabetes with neuropathy ([place]) 03/20/2018  . Acute on chronic combined systolic and diastolic CHF (congestive heart failure) ([place]) 11/27/2017  . AKI (acute kidney injury) ([place]) 11/27/2017  . Acute respiratory distress 11/27/2017  . Bulging lumbar disc 11/14/2017  . Gout 11/14/2017  . Hyperlipidemia LDL goal <70 09/30/2016  . Chest pain 06/17/2016  . Stress-induced cardiomyopathy 05/19/2016  . Type 2 diabetes mellitus with diabetic neuropathy, unspecified ([place]) 05/06/2016  . Vertigo 05/06/2016  . Controlled type 2 diabetes mellitus with hyperglycemia ([place]) 04/23/2016  . Hypertension 04/23/2016  . Normochromic normocytic anemia 04/23/2016  . Syncope 04/23/2016    Current Outpatient  Medications:  .  acetaminophen (TYLENOL) 500 MG tablet, Take 500-1,000 mg by mouth every 6 (six) hours as needed for mild pain or headache. (Patient not taking: Reported on 12/18/2019), Disp: , Rfl:  .  allopurinol (ZYLOPRIM) 100 MG tablet, Take 2 tablets (200 mg total) by mouth at bedtime. (Patient not taking: Reported on 12/25/2019), Disp: 30 tablet, Rfl: 1 .  aspirin EC 81 MG tablet, Take 81 mg by mouth in the morning.  (Patient not taking: Reported on 12/11/2019), Disp: , Rfl:  .  atorvastatin (LIPITOR) 80 MG tablet, Take 80 mg by mouth at bedtime.  (Patient not taking: Reported on 12/11/2019), Disp: , Rfl:  .  benzonatate (TESSALON) 100 MG capsule, Take 1 capsule (100 mg total) by mouth 2 (two) times daily as needed for cough., Disp: 20 capsule, Rfl: 0 .  DULoxetine (CYMBALTA) 30 MG capsule, Take 1 capsule (30 mg total) by mouth daily., Disp: 30 capsule, Rfl: 3 .  fluticasone (FLONASE) 50 MCG/ACT nasal spray, Place 2 sprays into both nostrils daily as needed. , Disp: , Rfl:  .  gabapentin (NEURONTIN) 100 MG capsule, TAKE 1 CAPSULE (100 MG TOTAL) BY MOUTH 2 (TWO) TIMES DAILY. (Patient taking differently: Take 100 mg by mouth 2 (two) times daily. ), Disp: 60 capsule, Rfl: 2 .  hydrALAZINE (APRESOLINE) 25 MG tablet, Take 1 tablet (25 mg total) by mouth 3 (three) times daily. (Patient not taking: Reported on 12/11/2019), Disp: 90 tablet, Rfl: 0 .  hydrOXYzine (ATARAX/VISTARIL) 25 MG tablet, Take 1 tablet (25 mg total) by mouth 2 (two) times daily as needed for itching. (Patient not taking:  Reported on 12/18/2019), Disp: 60 tablet, Rfl: 1 .  lansoprazole (PREVACID) 15 MG capsule, TAKE 1 CAPSULE (15 MG TOTAL) BY MOUTH DAILY. (Patient not taking: Reported on 12/25/2019), Disp: 30 capsule, Rfl: 2 .  LORazepam (ATIVAN) 0.5 MG tablet, Take 1 tablet (0.5 mg total) by mouth every 4 (four) hours as needed for anxiety. May crush, mix with water and give sublingually if needed., Disp: 42 tablet, Rfl: 0 .  Misc. Devices  MISC, Portable oxygen concentrator.  Diagnosis-chronic respiratory failure., Disp: 1 each, Rfl: 0 .  Misc. Devices MISC, Rollaor with seat. Dx: Congestive Heart Failure, Disp: 1 each, Rfl: 0 .  Misc. Devices MISC, Scale; Dx - CHF, Disp: 1 each, Rfl: 0 .  Misc. Devices [facility] bed.  Diagnosis CHF.  Lifetime use.  Weight 165 lbs., Disp: 1 each, Rfl: 0 .  Misc. Devices MISC, Shower chair DX CHF, Disp: 1 each, Rfl: 0 .  Misc. Devices MISC, Manual wheelchai with cushion, anti - tippers, brakes and lock.  Diagnosis congestive heart failure.  Weight 140 pounds., Disp: 1 each, Rfl: 0 .  morphine (ROXANOL) 20 MG/ML concentrated solution, Take 0.25 mLs (5 mg total) by mouth every 4 (four) hours as needed for shortness of breath. (Patient not taking: Reported on 12/11/2019), Disp: 30 mL, Rfl: 0 .  Multiple Vitamins-Minerals (MULTIVITAMIN WITH MINERALS) tablet, Take 1 tablet by mouth daily with breakfast.  (Patient not taking: Reported on 12/25/2019), Disp: , Rfl:  .  OXYGEN, Inhale 4 L/min into the lungs continuous., Disp: , Rfl:  .  polyethylene glycol (MIRALAX / GLYCOLAX) 17 g packet, Take 17 g by mouth daily as needed for moderate constipation. (Patient not taking: Reported on 12/18/2019), Disp: 30 each, Rfl: 1 .  potassium chloride SA (KLOR-CON) 20 MEQ tablet, Take 1 tablet (20 mEq total) by mouth daily for 7 days. (Patient not taking: Reported on 12/11/2019), Disp: 7 tablet, Rfl: 0 .  primidone (MYSOLINE) 50 MG tablet, Take 1 tablet (50 mg total) by mouth in the morning and at bedtime. (Patient not taking: Reported on 12/25/2019), Disp: 180 tablet, Rfl: 1 .  promethazine (PHENERGAN) 25 MG tablet, Take 1 tablet (25 mg total) by mouth every 8 (eight) hours as needed for nausea or vomiting. (Patient not taking: Reported on 12/25/2019), Disp: 30 tablet, Rfl: 1 .  SUPER B COMPLEX/C CAPS, Take 1 capsule by mouth daily.  (Patient not taking: Reported on 12/11/2019), Disp: , Rfl:  .  torsemide (DEMADEX) 20 MG tablet,  Take 2 tablets (40 mg total) by mouth 2 (two) times daily., Disp: 120 tablet, Rfl: 3 .  Vitamin D, Ergocalciferol, (DRISDOL) 1.25 MG (50000 UNIT) CAPS capsule, Take 50,000 Units by mouth every Sunday.  (Patient not taking: Reported on 12/11/2019), Disp: , Rfl:  Allergies  Allergen Reactions  . Garlic Shortness Of Breath, Itching, Swelling and Other (See Comments)    "Raw garlic" = Hand itching and swelling  . Isosorbide Other (See Comments)    Sneezing and runny nose  . Latex Itching  . Morphine And Related Itching and Other (See Comments)    Headache   . Other Itching and Other (See Comments)    Reaction to newspaper ink- Headaches, also     Social History   Socioeconomic History  . Marital status: Widowed    Spouse name: Not on file  . Number of children: Not on file  . Years of education: Not on file  . Highest education level: Not on file  Occupational History  .  Not on file  Tobacco Use  . Smoking status: Never Smoker  . Smokeless tobacco: Never Used  Vaping Use  . Vaping Use: Never used  Substance and Sexual Activity  . Alcohol use: No  . Drug use: No  . Sexual activity: Not Currently    Birth control/protection: None  Other Topics Concern  . Not on file  Social History Narrative  . Not on file   Social Determinants of Health   Financial Resource Strain:   . Difficulty of Paying Living Expenses: Not on file  Food Insecurity: No Food Insecurity  . Worried About Charity fundraiser in the Last Year: Never true  . Ran Out of Food in the Last Year: Never true  Transportation Needs:   . Lack of Transportation (Medical): Not on file  . Lack of Transportation (Non-Medical): Not on file  Physical Activity:   . Days of Exercise per Week: Not on file  . Minutes of Exercise per Session: Not on file  Stress:   . Feeling of Stress : Not on file  Social Connections:   . Frequency of Communication with Friends and Family: Not on file  . Frequency of Social Gatherings  with Friends and Family: Not on file  . Attends Religious Services: Not on file  . Active Member of Clubs or Organizations: Not on file  . Attends Archivist Meetings: Not on file  . Marital Status: Not on file  Intimate Partner Violence:   . Fear of Current or Ex-Partner: Not on file  . Emotionally Abused: Not on file  . Physically Abused: Not on file  . Sexually Abused: Not on file    Physical Exam Vitals reviewed.  Constitutional:      Appearance: She is normal weight.  HENT:     Head: Normocephalic.     Nose: Nose normal.     Mouth/Throat:     Mouth: Mucous membranes are moist.  Eyes:     Pupils: Pupils are equal, round, and reactive to light.  Cardiovascular:     Rate and Rhythm: Normal rate and regular rhythm.     Pulses: Normal pulses.     Heart sounds: Normal heart sounds.  Pulmonary:     Effort: Pulmonary effort is normal.     Breath sounds: Normal breath sounds.  Abdominal:     General: There is distension.     Palpations: Abdomen is soft.     Tenderness: There is abdominal tenderness.  Musculoskeletal:        General: Normal range of motion.     Cervical back: Normal range of motion.     Right lower leg: No edema.     Left lower leg: No edema.  Skin:    General: Skin is warm and dry.     Capillary Refill: Capillary refill takes less than 2 seconds.  Neurological:     Mental Status: She is alert. Mental status is at baseline.  Psychiatric:        Mood and Affect: Mood normal.     Arrived for home visit for Kara Hanson who was alert sitting upright in her bed in her room reporting abdominal pain. Kara Hanson stated she felt like she was holding some fluid. Weight was assessed and vitals were as noted. I reviewed medication and filled pill box successfully. Kara Hanson was reminded of importance of taking medications and dietary education. Kara Hanson had no swelling in his legs, abdomen was distended but soft and tender. Kara Hanson was on  pulse ox while walking to the  scale in the hall and heart rate increased to 110 and oxygen at 78% on 8LPM. Kara Hanson has not gotten out of bed in over two weeks and I advised her to be sure she gets up and walks a few times a day and is completed respiratory exercises daily. Kara Hanson returned to bed and sat down and oxygen improved and breathing efforts decreased. Kara Hanson agreed to watch fluids and diet. Home visit complete. I will see patient in one week.   Refills- none    Future Appointments  Date Time Provider [place]  01/29/2020  9:15 AM Tat, Eustace Quail, DO [facility] None     ACTION: Home visit completed Next visit planned for one week

## 2020-01-08 ENCOUNTER — Other Ambulatory Visit (HOSPITAL_COMMUNITY): Payer: Self-pay

## 2020-01-08 NOTE — Progress Notes (Signed)
Paramedicine Encounter    Patient ID: Kara Hanson, female    DOB: Jul 21, 1955, 64 y.o.   MRN: [id number]   Patient Care Team: [name], MD as PCP - General (Family Medicine) Jerline Pain, MD as PCP - Cardiology (Cardiology)  Patient Active Problem List   Diagnosis Date Noted  . DNR (do not resuscitate)   . DNI (do not intubate)   . Advanced directives, counseling/discussion   . Shortness of breath   . Abdominal pain 11/19/2019  . CKD (chronic kidney disease), stage IV ([place]) 11/19/2019  . Palliative care by specialist   . Goals of care, counseling/discussion   . Cardiorenal syndrome   . Hyponatremia 09/26/2019  . CHF exacerbation ([place]) 09/26/2019  . Acute on chronic systolic CHF (congestive heart failure) ([place]) 05/19/2019  . Elevated troponin 04/18/2019  . NSTEMI (non-ST elevated myocardial infarction) ([place]) 02/09/2019  . S/P thoracentesis   . Atelectasis   . Chronic respiratory failure ([place])   . CAP (community acquired pneumonia) 10/31/2018  . COVID-19 virus infection 07/20/2018  . Pneumonia due to COVID-19 virus 07/20/2018  . Vitamin D deficiency 04/02/2018  . Spinal stenosis 03/29/2018  . Acute on chronic respiratory failure with hypoxia ([place]) 03/20/2018  . Controlled type 2 diabetes with neuropathy ([place]) 03/20/2018  . Acute on chronic combined systolic and diastolic CHF (congestive heart failure) ([place]) 11/27/2017  . AKI (acute kidney injury) ([place]) 11/27/2017  . Acute respiratory distress 11/27/2017  . Bulging lumbar disc 11/14/2017  . Gout 11/14/2017  . Hyperlipidemia LDL goal <70 09/30/2016  . Chest pain 06/17/2016  . Stress-induced cardiomyopathy 05/19/2016  . Type 2 diabetes mellitus with diabetic neuropathy, unspecified ([place]) 05/06/2016  . Vertigo 05/06/2016  . Controlled type 2 diabetes mellitus with hyperglycemia ([place]) 04/23/2016  . Hypertension 04/23/2016  . Normochromic normocytic anemia 04/23/2016  . Syncope 04/23/2016    Current Outpatient  Medications:  .  acetaminophen (TYLENOL) 500 MG tablet, Take 500-1,000 mg by mouth every 6 (six) hours as needed for mild pain or headache. (Patient not taking: Reported on 01/01/2020), Disp: , Rfl:  .  allopurinol (ZYLOPRIM) 100 MG tablet, Take 2 tablets (200 mg total) by mouth at bedtime. (Patient not taking: Reported on 12/25/2019), Disp: 30 tablet, Rfl: 1 .  aspirin EC 81 MG tablet, Take 81 mg by mouth in the morning.  (Patient not taking: Reported on 12/11/2019), Disp: , Rfl:  .  atorvastatin (LIPITOR) 80 MG tablet, Take 80 mg by mouth at bedtime.  (Patient not taking: Reported on 12/11/2019), Disp: , Rfl:  .  benzonatate (TESSALON) 100 MG capsule, Take 1 capsule (100 mg total) by mouth 2 (two) times daily as needed for cough., Disp: 20 capsule, Rfl: 0 .  DULoxetine (CYMBALTA) 30 MG capsule, Take 1 capsule (30 mg total) by mouth daily., Disp: 30 capsule, Rfl: 3 .  fluticasone (FLONASE) 50 MCG/ACT nasal spray, Place 2 sprays into both nostrils daily as needed. , Disp: , Rfl:  .  gabapentin (NEURONTIN) 100 MG capsule, TAKE 1 CAPSULE (100 MG TOTAL) BY MOUTH 2 (TWO) TIMES DAILY. (Patient taking differently: Take 100 mg by mouth 2 (two) times daily. ), Disp: 60 capsule, Rfl: 2 .  hydrALAZINE (APRESOLINE) 25 MG tablet, Take 1 tablet (25 mg total) by mouth 3 (three) times daily. (Patient not taking: Reported on 12/11/2019), Disp: 90 tablet, Rfl: 0 .  hydrOXYzine (ATARAX/VISTARIL) 25 MG tablet, Take 1 tablet (25 mg total) by mouth 2 (two) times daily as needed for itching. (Patient not taking:  Reported on 12/18/2019), Disp: 60 tablet, Rfl: 1 .  lansoprazole (PREVACID) 15 MG capsule, TAKE 1 CAPSULE (15 MG TOTAL) BY MOUTH DAILY. (Patient not taking: Reported on 12/25/2019), Disp: 30 capsule, Rfl: 2 .  LORazepam (ATIVAN) 0.5 MG tablet, Take 1 tablet (0.5 mg total) by mouth every 4 (four) hours as needed for anxiety. May crush, mix with water and give sublingually if needed., Disp: 42 tablet, Rfl: 0 .  Misc. Devices  MISC, Portable oxygen concentrator.  Diagnosis-chronic respiratory failure., Disp: 1 each, Rfl: 0 .  Misc. Devices MISC, Rollaor with seat. Dx: Congestive Heart Failure, Disp: 1 each, Rfl: 0 .  Misc. Devices MISC, Scale; Dx - CHF, Disp: 1 each, Rfl: 0 .  Misc. Devices [facility] bed.  Diagnosis CHF.  Lifetime use.  Weight 165 lbs., Disp: 1 each, Rfl: 0 .  Misc. Devices MISC, Shower chair DX CHF, Disp: 1 each, Rfl: 0 .  Misc. Devices MISC, Manual wheelchai with cushion, anti - tippers, brakes and lock.  Diagnosis congestive heart failure.  Weight 140 pounds., Disp: 1 each, Rfl: 0 .  morphine (ROXANOL) 20 MG/ML concentrated solution, Take 0.25 mLs (5 mg total) by mouth every 4 (four) hours as needed for shortness of breath. (Patient not taking: Reported on 12/11/2019), Disp: 30 mL, Rfl: 0 .  Multiple Vitamins-Minerals (MULTIVITAMIN WITH MINERALS) tablet, Take 1 tablet by mouth daily with breakfast.  (Patient not taking: Reported on 12/25/2019), Disp: , Rfl:  .  OXYGEN, Inhale 4 L/min into the lungs continuous., Disp: , Rfl:  .  polyethylene glycol (MIRALAX / GLYCOLAX) 17 g packet, Take 17 g by mouth daily as needed for moderate constipation. (Patient not taking: Reported on 12/18/2019), Disp: 30 each, Rfl: 1 .  potassium chloride SA (KLOR-CON) 20 MEQ tablet, Take 1 tablet (20 mEq total) by mouth daily for 7 days. (Patient not taking: Reported on 12/11/2019), Disp: 7 tablet, Rfl: 0 .  primidone (MYSOLINE) 50 MG tablet, Take 1 tablet (50 mg total) by mouth in the morning and at bedtime. (Patient not taking: Reported on 12/25/2019), Disp: 180 tablet, Rfl: 1 .  promethazine (PHENERGAN) 25 MG tablet, Take 1 tablet (25 mg total) by mouth every 8 (eight) hours as needed for nausea or vomiting. (Patient not taking: Reported on 12/25/2019), Disp: 30 tablet, Rfl: 1 .  SUPER B COMPLEX/C CAPS, Take 1 capsule by mouth daily.  (Patient not taking: Reported on 12/11/2019), Disp: , Rfl:  .  torsemide (DEMADEX) 20 MG tablet,  Take 2 tablets (40 mg total) by mouth 2 (two) times daily., Disp: 120 tablet, Rfl: 3 .  Vitamin D, Ergocalciferol, (DRISDOL) 1.25 MG (50000 UNIT) CAPS capsule, Take 50,000 Units by mouth every Sunday.  (Patient not taking: Reported on 12/11/2019), Disp: , Rfl:  Allergies  Allergen Reactions  . Garlic Shortness Of Breath, Itching, Swelling and Other (See Comments)    "Raw garlic" = Hand itching and swelling  . Isosorbide Other (See Comments)    Sneezing and runny nose  . Latex Itching  . Morphine And Related Itching and Other (See Comments)    Headache   . Other Itching and Other (See Comments)    Reaction to newspaper ink- Headaches, also     Social History   Socioeconomic History  . Marital status: Widowed    Spouse name: Not on file  . Number of children: Not on file  . Years of education: Not on file  . Highest education level: Not on file  Occupational History  .  Not on file  Tobacco Use  . Smoking status: Never Smoker  . Smokeless tobacco: Never Used  Vaping Use  . Vaping Use: Never used  Substance and Sexual Activity  . Alcohol use: No  . Drug use: No  . Sexual activity: Not Currently    Birth control/protection: None  Other Topics Concern  . Not on file  Social History Narrative  . Not on file   Social Determinants of Health   Financial Resource Strain:   . Difficulty of Paying Living Expenses: Not on file  Food Insecurity: No Food Insecurity  . Worried About Charity fundraiser in the Last Year: Never true  . Ran Out of Food in the Last Year: Never true  Transportation Needs:   . Lack of Transportation (Medical): Not on file  . Lack of Transportation (Non-Medical): Not on file  Physical Activity:   . Days of Exercise per Week: Not on file  . Minutes of Exercise per Session: Not on file  Stress:   . Feeling of Stress : Not on file  Social Connections:   . Frequency of Communication with Friends and Family: Not on file  . Frequency of Social Gatherings  with Friends and Family: Not on file  . Attends Religious Services: Not on file  . Active Member of Clubs or Organizations: Not on file  . Attends Archivist Meetings: Not on file  . Marital Status: Not on file  Intimate Partner Violence:   . Fear of Current or Ex-Partner: Not on file  . Emotionally Abused: Not on file  . Physically Abused: Not on file  . Sexually Abused: Not on file    Physical Exam Vitals reviewed.  Constitutional:      Appearance: She is normal weight.  HENT:     Head: Normocephalic.     Nose: Nose normal.     Mouth/Throat:     Mouth: Mucous membranes are moist.     Pharynx: Oropharynx is clear.  Eyes:     Pupils: Pupils are equal, round, and reactive to light.  Cardiovascular:     Rate and Rhythm: Normal rate and regular rhythm.     Pulses: Normal pulses.     Heart sounds: Normal heart sounds.  Pulmonary:     Effort: Pulmonary effort is normal.     Breath sounds: Normal breath sounds.  Abdominal:     General: Abdomen is flat.     Palpations: Abdomen is soft.  Musculoskeletal:        General: Normal range of motion.     Cervical back: Normal range of motion.     Right lower leg: No edema.     Left lower leg: No edema.  Skin:    General: Skin is warm and dry.     Capillary Refill: Capillary refill takes less than 2 seconds.  Neurological:     Mental Status: She is alert. Mental status is at baseline.  Psychiatric:        Mood and Affect: Mood normal.     Arrived for home visit for Cierrah who was alert and oriented reporting she was feeling good. Patient's weight down 6lbs today. Vitals obtained as noted. Medications reviewed and confirmed. Pill box filled accordingly. While weighing patient's oxygen went down to 88% while seating on oxygen 96%. Lung sounds clear with a productive cough with brown mucus for over 4 weeks. Taralee denied any other symptoms.  Josalyn agreed to visit in one week. No refills  needed at this time.  Home visit  complete.   Refills- NONE       Future Appointments  Date Time Provider [place]  01/29/2020  9:15 AM Tat, Eustace Quail, DO [facility] None     ACTION: Home visit completed Next visit planned for one week

## 2020-01-15 ENCOUNTER — Other Ambulatory Visit (HOSPITAL_COMMUNITY): Payer: Self-pay

## 2020-01-15 NOTE — Progress Notes (Signed)
Paramedicine Encounter    Patient ID: Kara Hanson, female    DOB: [birth year], 64 y.o.   MRN: [id number]   Patient Care Team: [name], MD as PCP - General (Family Medicine) Jerline Pain, MD as PCP - Cardiology (Cardiology)  Patient Active Problem List   Diagnosis Date Noted  . DNR (do not resuscitate)   . DNI (do not intubate)   . Advanced directives, counseling/discussion   . Shortness of breath   . Abdominal pain 11/19/2019  . CKD (chronic kidney disease), stage IV ([place]) 11/19/2019  . Palliative care by specialist   . Goals of care, counseling/discussion   . Cardiorenal syndrome   . Hyponatremia 09/26/2019  . CHF exacerbation ([place]) 09/26/2019  . Acute on chronic systolic CHF (congestive heart failure) ([place]) 05/19/2019  . Elevated troponin 04/18/2019  . NSTEMI (non-ST elevated myocardial infarction) ([place]) 02/09/2019  . S/P thoracentesis   . Atelectasis   . Chronic respiratory failure ([place])   . CAP (community acquired pneumonia) 10/31/2018  . COVID-19 virus infection 07/20/2018  . Pneumonia due to COVID-19 virus 07/20/2018  . Vitamin D deficiency 04/02/2018  . Spinal stenosis 03/29/2018  . Acute on chronic respiratory failure with hypoxia ([place]) 03/20/2018  . Controlled type 2 diabetes with neuropathy ([place]) 03/20/2018  . Acute on chronic combined systolic and diastolic CHF (congestive heart failure) ([place]) 11/27/2017  . AKI (acute kidney injury) ([place]) 11/27/2017  . Acute respiratory distress 11/27/2017  . Bulging lumbar disc 11/14/2017  . Gout 11/14/2017  . Hyperlipidemia LDL goal <70 09/30/2016  . Chest pain 06/17/2016  . Stress-induced cardiomyopathy 05/19/2016  . Type 2 diabetes mellitus with diabetic neuropathy, unspecified ([place]) 05/06/2016  . Vertigo 05/06/2016  . Controlled type 2 diabetes mellitus with hyperglycemia ([place]) 04/23/2016  . Hypertension 04/23/2016  . Normochromic normocytic anemia 04/23/2016  . Syncope 04/23/2016    Current Outpatient  Medications:  .  acetaminophen (TYLENOL) 500 MG tablet, Take 500-1,000 mg by mouth every 6 (six) hours as needed for mild pain or headache. , Disp: , Rfl:  .  allopurinol (ZYLOPRIM) 100 MG tablet, Take 2 tablets (200 mg total) by mouth at bedtime. (Patient not taking: Reported on 01/08/2020), Disp: 30 tablet, Rfl: 1 .  aspirin EC 81 MG tablet, Take 81 mg by mouth in the morning.  (Patient not taking: Reported on 01/08/2020), Disp: , Rfl:  .  atorvastatin (LIPITOR) 80 MG tablet, Take 80 mg by mouth at bedtime.  (Patient not taking: Reported on 01/08/2020), Disp: , Rfl:  .  benzonatate (TESSALON) 100 MG capsule, Take 1 capsule (100 mg total) by mouth 2 (two) times daily as needed for cough., Disp: 20 capsule, Rfl: 0 .  DULoxetine (CYMBALTA) 30 MG capsule, Take 1 capsule (30 mg total) by mouth daily., Disp: 30 capsule, Rfl: 3 .  fluticasone (FLONASE) 50 MCG/ACT nasal spray, Place 2 sprays into both nostrils daily as needed.  (Patient not taking: Reported on 01/08/2020), Disp: , Rfl:  .  gabapentin (NEURONTIN) 100 MG capsule, TAKE 1 CAPSULE (100 MG TOTAL) BY MOUTH 2 (TWO) TIMES DAILY. (Patient taking differently: Take 100 mg by mouth 2 (two) times daily. ), Disp: 60 capsule, Rfl: 2 .  hydrALAZINE (APRESOLINE) 25 MG tablet, Take 1 tablet (25 mg total) by mouth 3 (three) times daily. (Patient not taking: Reported on 12/11/2019), Disp: 90 tablet, Rfl: 0 .  hydrOXYzine (ATARAX/VISTARIL) 25 MG tablet, Take 1 tablet (25 mg total) by mouth 2 (two) times daily as needed for itching. (Patient not  taking: Reported on 12/18/2019), Disp: 60 tablet, Rfl: 1 .  lansoprazole (PREVACID) 15 MG capsule, TAKE 1 CAPSULE (15 MG TOTAL) BY MOUTH DAILY. (Patient not taking: Reported on 12/25/2019), Disp: 30 capsule, Rfl: 2 .  LORazepam (ATIVAN) 0.5 MG tablet, Take 1 tablet (0.5 mg total) by mouth every 4 (four) hours as needed for anxiety. May crush, mix with water and give sublingually if needed., Disp: 42 tablet, Rfl: 0 .  Misc. Devices  MISC, Portable oxygen concentrator.  Diagnosis-chronic respiratory failure., Disp: 1 each, Rfl: 0 .  Misc. Devices MISC, Rollaor with seat. Dx: Congestive Heart Failure, Disp: 1 each, Rfl: 0 .  Misc. Devices MISC, Scale; Dx - CHF, Disp: 1 each, Rfl: 0 .  Misc. Devices [facility] bed.  Diagnosis CHF.  Lifetime use.  Weight 165 lbs., Disp: 1 each, Rfl: 0 .  Misc. Devices MISC, Shower chair DX CHF, Disp: 1 each, Rfl: 0 .  Misc. Devices MISC, Manual wheelchai with cushion, anti - tippers, brakes and lock.  Diagnosis congestive heart failure.  Weight 140 pounds., Disp: 1 each, Rfl: 0 .  morphine (ROXANOL) 20 MG/ML concentrated solution, Take 0.25 mLs (5 mg total) by mouth every 4 (four) hours as needed for shortness of breath. (Patient not taking: Reported on 12/11/2019), Disp: 30 mL, Rfl: 0 .  Multiple Vitamins-Minerals (MULTIVITAMIN WITH MINERALS) tablet, Take 1 tablet by mouth daily with breakfast.  (Patient not taking: Reported on 12/25/2019), Disp: , Rfl:  .  OXYGEN, Inhale 4 L/min into the lungs continuous., Disp: , Rfl:  .  polyethylene glycol (MIRALAX / GLYCOLAX) 17 g packet, Take 17 g by mouth daily as needed for moderate constipation. (Patient not taking: Reported on 12/18/2019), Disp: 30 each, Rfl: 1 .  potassium chloride SA (KLOR-CON) 20 MEQ tablet, Take 1 tablet (20 mEq total) by mouth daily for 7 days., Disp: 7 tablet, Rfl: 0 .  primidone (MYSOLINE) 50 MG tablet, Take 1 tablet (50 mg total) by mouth in the morning and at bedtime. (Patient not taking: Reported on 12/25/2019), Disp: 180 tablet, Rfl: 1 .  promethazine (PHENERGAN) 25 MG tablet, Take 1 tablet (25 mg total) by mouth every 8 (eight) hours as needed for nausea or vomiting. (Patient not taking: Reported on 12/25/2019), Disp: 30 tablet, Rfl: 1 .  SUPER B COMPLEX/C CAPS, Take 1 capsule by mouth daily.  (Patient not taking: Reported on 12/11/2019), Disp: , Rfl:  .  torsemide (DEMADEX) 20 MG tablet, Take 2 tablets (40 mg total) by mouth 2  (two) times daily., Disp: 120 tablet, Rfl: 3 .  Vitamin D, Ergocalciferol, (DRISDOL) 1.25 MG (50000 UNIT) CAPS capsule, Take 50,000 Units by mouth every Sunday.  (Patient not taking: Reported on 12/11/2019), Disp: , Rfl:  Allergies  Allergen Reactions  . Garlic Shortness Of Breath, Itching, Swelling and Other (See Comments)    "Raw garlic" = Hand itching and swelling  . Isosorbide Other (See Comments)    Sneezing and runny nose  . Latex Itching  . Morphine And Related Itching and Other (See Comments)    Headache   . Other Itching and Other (See Comments)    Reaction to newspaper ink- Headaches, also     Social History   Socioeconomic History  . Marital status: Widowed    Spouse name: Not on file  . Number of children: Not on file  . Years of education: Not on file  . Highest education level: Not on file  Occupational History  . Not on file  Tobacco Use  . Smoking status: Never Smoker  . Smokeless tobacco: Never Used  Vaping Use  . Vaping Use: Never used  Substance and Sexual Activity  . Alcohol use: No  . Drug use: No  . Sexual activity: Not Currently    Birth control/protection: None  Other Topics Concern  . Not on file  Social History Narrative  . Not on file   Social Determinants of Health   Financial Resource Strain:   . Difficulty of Paying Living Expenses: Not on file  Food Insecurity: No Food Insecurity  . Worried About Charity fundraiser in the Last Year: Never true  . Ran Out of Food in the Last Year: Never true  Transportation Needs:   . Lack of Transportation (Medical): Not on file  . Lack of Transportation (Non-Medical): Not on file  Physical Activity:   . Days of Exercise per Week: Not on file  . Minutes of Exercise per Session: Not on file  Stress:   . Feeling of Stress : Not on file  Social Connections:   . Frequency of Communication with Friends and Family: Not on file  . Frequency of Social Gatherings with Friends and Family: Not on file  .  Attends Religious Services: Not on file  . Active Member of Clubs or Organizations: Not on file  . Attends Archivist Meetings: Not on file  . Marital Status: Not on file  Intimate Partner Violence:   . Fear of Current or Ex-Partner: Not on file  . Emotionally Abused: Not on file  . Physically Abused: Not on file  . Sexually Abused: Not on file    Physical Exam Vitals reviewed.  Constitutional:      Appearance: She is normal weight.  HENT:     Head: Normocephalic.     Nose: Nose normal.     Mouth/Throat:     Mouth: Mucous membranes are moist.     Pharynx: Oropharynx is clear.  Eyes:     Pupils: Pupils are equal, round, and reactive to light.  Cardiovascular:     Rate and Rhythm: Normal rate and regular rhythm.     Pulses: Normal pulses.     Heart sounds: Normal heart sounds.  Pulmonary:     Effort: Pulmonary effort is normal.     Breath sounds: Normal breath sounds.  Abdominal:     General: Abdomen is flat.     Palpations: Abdomen is soft.  Musculoskeletal:        General: Swelling present. Normal range of motion.     Cervical back: Normal range of motion.     Right lower leg: No edema.     Left lower leg: Edema present.  Skin:    General: Skin is warm and dry.     Capillary Refill: Capillary refill takes less than 2 seconds.  Neurological:     Mental Status: She is alert. Mental status is at baseline.  Psychiatric:        Mood and Affect: Mood normal.    Arrived for home visit for Kara Hanson who was alert and oriented reporting her right eye swollen with some itching. Kara Hanson was noted to have missed 7 days worth of AM dosing of medications including her daily allergy pill. I educated Kara Hanson on importance of making sure she takes her medications properly. She reports she has to have someone give them to her due to her tremors and her daughter hasn't been giving them to her, however her daughter  reports Kara Hanson is reporting she doesn't want to take her medicines due  to the taste. I addressed same and suggested bubble packs for medications and maybe taking them with applesauce or pudding. Daffney and her daughter agreed. I will seek information for bubble packs for Kara Hanson. I will be assisting in some dietary education as well. I will also email Kara Hanson's daughter a copy of same. Kara Hanson agreed and understood importance of taking her medicines. Vitals as noted. Bedtime pills confirmed and placed in pill box. Patient's weight normal with no increase. Right leg noting edema and Left leg slight note of swelling. Lung sounds clear. Patient reports cough has subsided after several weeks. Kara Hanson agreed to visit in one week. Home visit complete.   Refills: NON  BUBBLE PACKS(talking to [facility])       Future Appointments  Date Time Provider [place]  01/29/2020  9:15 AM Tat, Eustace Quail, DO [facility] None     ACTION: Home visit completed Next visit planned for one week

## 2020-01-23 ENCOUNTER — Other Ambulatory Visit (HOSPITAL_COMMUNITY): Payer: Self-pay

## 2020-01-23 NOTE — Progress Notes (Signed)
Paramedicine Encounter    Patient ID: Kara Hanson, female    DOB: 08/29/1955, 64 y.o.   MRN: [id number]   Patient Care Team: [name], MD as PCP - General (Family Medicine) Jerline Pain, MD as PCP - Cardiology (Cardiology)  Patient Active Problem List   Diagnosis Date Noted  . DNR (do not resuscitate)   . DNI (do not intubate)   . Advanced directives, counseling/discussion   . Shortness of breath   . Abdominal pain 11/19/2019  . CKD (chronic kidney disease), stage IV ([place]) 11/19/2019  . Palliative care by specialist   . Goals of care, counseling/discussion   . Cardiorenal syndrome   . Hyponatremia 09/26/2019  . CHF exacerbation ([place]) 09/26/2019  . Acute on chronic systolic CHF (congestive heart failure) ([place]) 05/19/2019  . Elevated troponin 04/18/2019  . NSTEMI (non-ST elevated myocardial infarction) ([place]) 02/09/2019  . S/P thoracentesis   . Atelectasis   . Chronic respiratory failure ([place])   . CAP (community acquired pneumonia) 10/31/2018  . COVID-19 virus infection 07/20/2018  . Pneumonia due to COVID-19 virus 07/20/2018  . Vitamin D deficiency 04/02/2018  . Spinal stenosis 03/29/2018  . Acute on chronic respiratory failure with hypoxia ([place]) 03/20/2018  . Controlled type 2 diabetes with neuropathy ([place]) 03/20/2018  . Acute on chronic combined systolic and diastolic CHF (congestive heart failure) ([place]) 11/27/2017  . AKI (acute kidney injury) ([place]) 11/27/2017  . Acute respiratory distress 11/27/2017  . Bulging lumbar disc 11/14/2017  . Gout 11/14/2017  . Hyperlipidemia LDL goal <70 09/30/2016  . Chest pain 06/17/2016  . Stress-induced cardiomyopathy 05/19/2016  . Type 2 diabetes mellitus with diabetic neuropathy, unspecified ([place]) 05/06/2016  . Vertigo 05/06/2016  . Controlled type 2 diabetes mellitus with hyperglycemia ([place]) 04/23/2016  . Hypertension 04/23/2016  . Normochromic normocytic anemia 04/23/2016  . Syncope 04/23/2016    Current Outpatient  Medications:  .  acetaminophen (TYLENOL) 500 MG tablet, Take 500-1,000 mg by mouth every 6 (six) hours as needed for mild pain or headache. , Disp: , Rfl:  .  allopurinol (ZYLOPRIM) 100 MG tablet, Take 2 tablets (200 mg total) by mouth at bedtime. (Patient not taking: Reported on 01/08/2020), Disp: 30 tablet, Rfl: 1 .  aspirin EC 81 MG tablet, Take 81 mg by mouth in the morning.  (Patient not taking: Reported on 01/08/2020), Disp: , Rfl:  .  atorvastatin (LIPITOR) 80 MG tablet, Take 80 mg by mouth at bedtime.  (Patient not taking: Reported on 01/08/2020), Disp: , Rfl:  .  benzonatate (TESSALON) 100 MG capsule, Take 1 capsule (100 mg total) by mouth 2 (two) times daily as needed for cough. (Patient not taking: Reported on 01/15/2020), Disp: 20 capsule, Rfl: 0 .  DULoxetine (CYMBALTA) 30 MG capsule, Take 1 capsule (30 mg total) by mouth daily., Disp: 30 capsule, Rfl: 3 .  fluticasone (FLONASE) 50 MCG/ACT nasal spray, Place 2 sprays into both nostrils daily as needed. , Disp: , Rfl:  .  gabapentin (NEURONTIN) 100 MG capsule, TAKE 1 CAPSULE (100 MG TOTAL) BY MOUTH 2 (TWO) TIMES DAILY. (Patient taking differently: Take 100 mg by mouth 2 (two) times daily. ), Disp: 60 capsule, Rfl: 2 .  hydrALAZINE (APRESOLINE) 25 MG tablet, Take 1 tablet (25 mg total) by mouth 3 (three) times daily. (Patient not taking: Reported on 12/11/2019), Disp: 90 tablet, Rfl: 0 .  hydrOXYzine (ATARAX/VISTARIL) 25 MG tablet, Take 1 tablet (25 mg total) by mouth 2 (two) times daily as needed for itching. (Patient not  taking: Reported on 12/18/2019), Disp: 60 tablet, Rfl: 1 .  lansoprazole (PREVACID) 15 MG capsule, TAKE 1 CAPSULE (15 MG TOTAL) BY MOUTH DAILY., Disp: 30 capsule, Rfl: 2 .  LORazepam (ATIVAN) 0.5 MG tablet, Take 1 tablet (0.5 mg total) by mouth every 4 (four) hours as needed for anxiety. May crush, mix with water and give sublingually if needed., Disp: 42 tablet, Rfl: 0 .  Misc. Devices MISC, Portable oxygen concentrator.   Diagnosis-chronic respiratory failure., Disp: 1 each, Rfl: 0 .  Misc. Devices MISC, Rollaor with seat. Dx: Congestive Heart Failure, Disp: 1 each, Rfl: 0 .  Misc. Devices MISC, Scale; Dx - CHF, Disp: 1 each, Rfl: 0 .  Misc. Devices [facility] bed.  Diagnosis CHF.  Lifetime use.  Weight 165 lbs., Disp: 1 each, Rfl: 0 .  Misc. Devices MISC, Shower chair DX CHF, Disp: 1 each, Rfl: 0 .  Misc. Devices MISC, Manual wheelchai with cushion, anti - tippers, brakes and lock.  Diagnosis congestive heart failure.  Weight 140 pounds., Disp: 1 each, Rfl: 0 .  morphine (ROXANOL) 20 MG/ML concentrated solution, Take 0.25 mLs (5 mg total) by mouth every 4 (four) hours as needed for shortness of breath., Disp: 30 mL, Rfl: 0 .  Multiple Vitamins-Minerals (MULTIVITAMIN WITH MINERALS) tablet, Take 1 tablet by mouth daily with breakfast.  (Patient not taking: Reported on 12/25/2019), Disp: , Rfl:  .  OXYGEN, Inhale 4 L/min into the lungs continuous., Disp: , Rfl:  .  polyethylene glycol (MIRALAX / GLYCOLAX) 17 g packet, Take 17 g by mouth daily as needed for moderate constipation. (Patient not taking: Reported on 12/18/2019), Disp: 30 each, Rfl: 1 .  potassium chloride SA (KLOR-CON) 20 MEQ tablet, Take 1 tablet (20 mEq total) by mouth daily for 7 days., Disp: 7 tablet, Rfl: 0 .  primidone (MYSOLINE) 50 MG tablet, Take 1 tablet (50 mg total) by mouth in the morning and at bedtime. (Patient not taking: Reported on 12/25/2019), Disp: 180 tablet, Rfl: 1 .  promethazine (PHENERGAN) 25 MG tablet, Take 1 tablet (25 mg total) by mouth every 8 (eight) hours as needed for nausea or vomiting. (Patient not taking: Reported on 12/25/2019), Disp: 30 tablet, Rfl: 1 .  SUPER B COMPLEX/C CAPS, Take 1 capsule by mouth daily.  (Patient not taking: Reported on 12/11/2019), Disp: , Rfl:  .  torsemide (DEMADEX) 20 MG tablet, Take 2 tablets (40 mg total) by mouth 2 (two) times daily., Disp: 120 tablet, Rfl: 3 .  Vitamin D, Ergocalciferol,  (DRISDOL) 1.25 MG (50000 UNIT) CAPS capsule, Take 50,000 Units by mouth every Sunday.  (Patient not taking: Reported on 12/11/2019), Disp: , Rfl:  Allergies  Allergen Reactions  . Garlic Shortness Of Breath, Itching, Swelling and Other (See Comments)    "Raw garlic" = Hand itching and swelling  . Isosorbide Other (See Comments)    Sneezing and runny nose  . Latex Itching  . Morphine And Related Itching and Other (See Comments)    Headache   . Other Itching and Other (See Comments)    Reaction to newspaper ink- Headaches, also     Social History   Socioeconomic History  . Marital status: Widowed    Spouse name: Not on file  . Number of children: Not on file  . Years of education: Not on file  . Highest education level: Not on file  Occupational History  . Not on file  Tobacco Use  . Smoking status: Never Smoker  . Smokeless  tobacco: Never Used  Vaping Use  . Vaping Use: Never used  Substance and Sexual Activity  . Alcohol use: No  . Drug use: No  . Sexual activity: Not Currently    Birth control/protection: None  Other Topics Concern  . Not on file  Social History Narrative  . Not on file   Social Determinants of Health   Financial Resource Strain:   . Difficulty of Paying Living Expenses: Not on file  Food Insecurity: No Food Insecurity  . Worried About Charity fundraiser in the Last Year: Never true  . Ran Out of Food in the Last Year: Never true  Transportation Needs:   . Lack of Transportation (Medical): Not on file  . Lack of Transportation (Non-Medical): Not on file  Physical Activity:   . Days of Exercise per Week: Not on file  . Minutes of Exercise per Session: Not on file  Stress:   . Feeling of Stress : Not on file  Social Connections:   . Frequency of Communication with Friends and Family: Not on file  . Frequency of Social Gatherings with Friends and Family: Not on file  . Attends Religious Services: Not on file  . Active Member of Clubs or  Organizations: Not on file  . Attends Archivist Meetings: Not on file  . Marital Status: Not on file  Intimate Partner Violence:   . Fear of Current or Ex-Partner: Not on file  . Emotionally Abused: Not on file  . Physically Abused: Not on file  . Sexually Abused: Not on file    Physical Exam Vitals reviewed.  Constitutional:      Appearance: She is normal weight.  HENT:     Head: Normocephalic.     Nose: Nose normal.     Mouth/Throat:     Mouth: Mucous membranes are moist.     Pharynx: Oropharynx is clear.  Eyes:     Pupils: Pupils are equal, round, and reactive to light.  Cardiovascular:     Rate and Rhythm: Normal rate and regular rhythm.     Pulses: Normal pulses.     Heart sounds: Normal heart sounds.  Pulmonary:     Effort: Pulmonary effort is normal.     Breath sounds: Normal breath sounds.  Abdominal:     General: Abdomen is flat.     Palpations: Abdomen is soft.  Musculoskeletal:        General: Normal range of motion.     Cervical back: Normal range of motion.     Right lower leg: No edema.     Left lower leg: Edema present.  Skin:    Capillary Refill: Capillary refill takes less than 2 seconds.  Neurological:     General: No focal deficit present.     Mental Status: She is alert. Mental status is at baseline.  Psychiatric:        Mood and Affect: Mood normal.       Arrived for home visit for [place] who was alert and oriented reporting she was feeling okay but had a really tough weekend with a flare up with her diverticulitis. I will send over dietary assistance for same to [name] (Goldye's daughter). Vitals were obtained. Carrie noted to have missed 4 AM doses and 6 PM doses of her medications over the last week. Edema noted to left foot. Kinda was educated on importance of taking her medications and that this would lead to her being hospitalized if not taking  her meds. She verbalized understanding. I also told her daughter the same. They  agreed. [name] lung sounds clear and no JVD noted. Home visit complete I will see her in one week.   Refills:  Gabapentin   Future Appointments  Date Time Provider [place]  01/29/2020  9:15 AM Tat, Eustace Quail, DO [facility] None     ACTION: Home visit completed Next visit planned for one week

## 2020-01-27 NOTE — Progress Notes (Deleted)
Assessment/Plan:    1.  Essential Tremor  ***No longer a DBS candidate given cardiac status.  She is in palliative/hospice care for this.  -Her tremor is moderate to moderately severe and will not get good control on this with medication alone.  She and I have discussed this in detail.  She may get a little bit better control with   2.  Left ptosis  -Acetylcholine receptor Subjective:   Kara Hanson was seen today in follow up for moderately severe essential tremor.  My previous records were reviewed prior to todays visit.  Last visit, we started the patient on primidone, but also discussed that the primidone is not going to get rid of the tremor, but will hopefully help to decrease frequency and amplitude of tremor.  She does have a community paramedic that helps her out with medications and she emailed me at the end of July stating that patient's tremor was still quite severe (not surprising as we talked about DBS with her).  I told her that I really would like her to follow-up before we change her medication.  She reports today that ***she quit taking the medication. she had no side effects with the medication.  She has been oxygen dependent ever since Covid a year and a half ago.  She was in the hospital in August for acute on chronic respiratory failure secondary to acute on chronic heart failure.  Cardiology recommended palliative/hospice care at discharge and felt that overall prognosis was poor.  She does have left ptosis, and we did check acetylcholine receptor antibodies those were negative.  Current prescribed movement disorder medications: ***Primidone, 50 mg twice per day (started last visit, but patient stopped)   PREVIOUS MEDICATIONS: {Parkinson's RX:18200}  ALLERGIES:   Allergies  Allergen Reactions  . Garlic Shortness Of Breath, Itching, Swelling and Other (See Comments)    "Raw garlic" = Hand itching and swelling  . Isosorbide Other (See Comments)    Sneezing and  runny nose  . Latex Itching  . Morphine And Related Itching and Other (See Comments)    Headache   . Other Itching and Other (See Comments)    Reaction to newspaper ink- Headaches, also    CURRENT MEDICATIONS:  Outpatient Encounter Medications as of 01/29/2020  Medication Sig  . acetaminophen (TYLENOL) 500 MG tablet Take 500-1,000 mg by mouth every 6 (six) hours as needed for mild pain or headache.   . allopurinol (ZYLOPRIM) 100 MG tablet Take 2 tablets (200 mg total) by mouth at bedtime. (Patient not taking: Reported on 01/08/2020)  . aspirin EC 81 MG tablet Take 81 mg by mouth in the morning.  (Patient not taking: Reported on 01/08/2020)  . atorvastatin (LIPITOR) 80 MG tablet Take 80 mg by mouth at bedtime.  (Patient not taking: Reported on 01/08/2020)  . benzonatate (TESSALON) 100 MG capsule Take 1 capsule (100 mg total) by mouth 2 (two) times daily as needed for cough. (Patient not taking: Reported on 01/15/2020)  . DULoxetine (CYMBALTA) 30 MG capsule Take 1 capsule (30 mg total) by mouth daily.  . fluticasone (FLONASE) 50 MCG/ACT nasal spray Place 2 sprays into both nostrils daily as needed.   . gabapentin (NEURONTIN) 100 MG capsule TAKE 1 CAPSULE (100 MG TOTAL) BY MOUTH 2 (TWO) TIMES DAILY. (Patient taking differently: Take 100 mg by mouth 2 (two) times daily. )  . hydrALAZINE (APRESOLINE) 25 MG tablet Take 1 tablet (25 mg total) by mouth 3 (three) times daily. (Patient not  taking: Reported on 12/11/2019)  . hydrOXYzine (ATARAX/VISTARIL) 25 MG tablet Take 1 tablet (25 mg total) by mouth 2 (two) times daily as needed for itching. (Patient not taking: Reported on 12/18/2019)  . lansoprazole (PREVACID) 15 MG capsule TAKE 1 CAPSULE (15 MG TOTAL) BY MOUTH DAILY. (Patient not taking: Reported on 01/23/2020)  . LORazepam (ATIVAN) 0.5 MG tablet Take 1 tablet (0.5 mg total) by mouth every 4 (four) hours as needed for anxiety. May crush, mix with water and give sublingually if needed.  . Misc. Devices  MISC Portable oxygen concentrator.  Diagnosis-chronic respiratory failure.  . Misc. Devices MISC Rollaor with seat. Dx: Congestive Heart Failure  . Misc. Devices MISC Scale; Dx - CHF  . Misc. [facility] bed.  Diagnosis CHF.  Lifetime use.  Weight 165 lbs.  . Misc. Devices [place] Shower chair DX CHF  . Misc. Devices MISC Manual wheelchai with cushion, anti - tippers, brakes and lock.  Diagnosis congestive heart failure.  Weight 140 pounds.  . morphine (ROXANOL) 20 MG/ML concentrated solution Take 0.25 mLs (5 mg total) by mouth every 4 (four) hours as needed for shortness of breath. (Patient not taking: Reported on 01/23/2020)  . Multiple Vitamins-Minerals (MULTIVITAMIN WITH MINERALS) tablet Take 1 tablet by mouth daily with breakfast.  (Patient not taking: Reported on 12/25/2019)  . OXYGEN Inhale 4 L/min into the lungs continuous.  . polyethylene glycol (MIRALAX / GLYCOLAX) 17 g packet Take 17 g by mouth daily as needed for moderate constipation. (Patient not taking: Reported on 12/18/2019)  . potassium chloride SA (KLOR-CON) 20 MEQ tablet Take 1 tablet (20 mEq total) by mouth daily for 7 days. (Patient not taking: Reported on 01/23/2020)  . primidone (MYSOLINE) 50 MG tablet Take 1 tablet (50 mg total) by mouth in the morning and at bedtime. (Patient not taking: Reported on 12/25/2019)  . promethazine (PHENERGAN) 25 MG tablet Take 1 tablet (25 mg total) by mouth every 8 (eight) hours as needed for nausea or vomiting. (Patient not taking: Reported on 12/25/2019)  . SUPER B COMPLEX/C CAPS Take 1 capsule by mouth daily.  (Patient not taking: Reported on 12/11/2019)  . torsemide (DEMADEX) 20 MG tablet Take 2 tablets (40 mg total) by mouth 2 (two) times daily.  . Vitamin D, Ergocalciferol, (DRISDOL) 1.25 MG (50000 UNIT) CAPS capsule Take 50,000 Units by mouth every Sunday.  (Patient not taking: Reported on 12/11/2019)   No facility-administered encounter medications on file as of 01/29/2020.      Objective:    PHYSICAL EXAMINATION:    VITALS:  There were no vitals filed for this visit.  GEN:  The patient appears stated age and is in NAD. HEENT:  Normocephalic, atraumatic.  The mucous membranes are moist. The superficial temporal arteries are without ropiness or tenderness. CV:  RRR Lungs:  CTAB Neck/HEME:  There are no carotid bruits bilaterally.  Neurological examination:  Orientation: The patient is alert and oriented x3. Cranial nerves: There is good facial symmetry. The speech is fluent and clear. Soft palate rises symmetrically and there is no tongue deviation. Hearing is intact to conversational tone. Sensation: Sensation is intact to light touch throughout Motor: Strength is at least antigravity x4.  Movement examination: Tone: There is normal tone in the UE/LE Abnormal movements: *** Coordination:  There is *** decremation with RAM's, *** Gait and Station: The patient has *** difficulty arising out of a deep-seated chair without the use of the hands. The patient's stride length is good I have  reviewed and interpreted the following labs independently   Chemistry      Component Value Date/Time   NA 140 12/02/2019 0020   NA 139 10/24/2019 1203   K 4.9 12/02/2019 0020   CL 108 12/02/2019 0020   CO2 24 12/02/2019 0020   BUN 38 (H) 12/02/2019 0020   BUN 50 (H) 10/24/2019 1203   CREATININE 2.14 (H) 12/02/2019 0020      Component Value Date/Time   CALCIUM 9.5 12/02/2019 0020   ALKPHOS 80 11/27/2019 1440   AST 15 11/27/2019 1440   ALT 19 11/27/2019 1440   BILITOT 0.5 11/27/2019 1440   BILITOT <0.2 10/24/2019 1203      Lab Results  Component Value Date   WBC 7.6 11/27/2019   HGB 9.9 (L) 11/27/2019   HCT 29.0 (L) 11/27/2019   MCV 96.1 11/27/2019   PLT 311 11/27/2019   Lab Results  Component Value Date   TSH 3.156 09/26/2019     Chemistry      Component Value Date/Time   NA 140 12/02/2019 0020   NA 139 10/24/2019 1203   K 4.9 12/02/2019  0020   CL 108 12/02/2019 0020   CO2 24 12/02/2019 0020   BUN 38 (H) 12/02/2019 0020   BUN 50 (H) 10/24/2019 1203   CREATININE 2.14 (H) 12/02/2019 0020      Component Value Date/Time   CALCIUM 9.5 12/02/2019 0020   ALKPHOS 80 11/27/2019 1440   AST 15 11/27/2019 1440   ALT 19 11/27/2019 1440   BILITOT 0.5 11/27/2019 1440   BILITOT <0.2 10/24/2019 1203         Total time spent on today's visit was ***30 minutes, including both face-to-face time and nonface-to-face time.  Time included that spent on review of records (prior notes available to me/labs/imaging if pertinent), discussing treatment and goals, answering patient's questions and coordinating care.  Cc:  [name], MD

## 2020-01-29 ENCOUNTER — Ambulatory Visit: Payer: Medicare Other | Admitting: Neurology

## 2020-01-30 ENCOUNTER — Other Ambulatory Visit (HOSPITAL_COMMUNITY): Payer: Self-pay

## 2020-01-30 NOTE — Progress Notes (Signed)
Paramedicine Encounter    Patient ID: Genise Strack, female    DOB: 10-22-1955, 64 y.o.   MRN: [id number]   Patient Care Team: [name], MD as PCP - General (Family Medicine) Jerline Pain, MD as PCP - Cardiology (Cardiology)  Patient Active Problem List   Diagnosis Date Noted  . DNR (do not resuscitate)   . DNI (do not intubate)   . Advanced directives, counseling/discussion   . Shortness of breath   . Abdominal pain 11/19/2019  . CKD (chronic kidney disease), stage IV ([place]) 11/19/2019  . Palliative care by specialist   . Goals of care, counseling/discussion   . Cardiorenal syndrome   . Hyponatremia 09/26/2019  . CHF exacerbation ([place]) 09/26/2019  . Acute on chronic systolic CHF (congestive heart failure) ([place]) 05/19/2019  . Elevated troponin 04/18/2019  . NSTEMI (non-ST elevated myocardial infarction) ([place]) 02/09/2019  . S/P thoracentesis   . Atelectasis   . Chronic respiratory failure ([place])   . CAP (community acquired pneumonia) 10/31/2018  . COVID-19 virus infection 07/20/2018  . Pneumonia due to COVID-19 virus 07/20/2018  . Vitamin D deficiency 04/02/2018  . Spinal stenosis 03/29/2018  . Acute on chronic respiratory failure with hypoxia ([place]) 03/20/2018  . Controlled type 2 diabetes with neuropathy ([place]) 03/20/2018  . Acute on chronic combined systolic and diastolic CHF (congestive heart failure) ([place]) 11/27/2017  . AKI (acute kidney injury) ([place]) 11/27/2017  . Acute respiratory distress 11/27/2017  . Bulging lumbar disc 11/14/2017  . Gout 11/14/2017  . Hyperlipidemia LDL goal <70 09/30/2016  . Chest pain 06/17/2016  . Stress-induced cardiomyopathy 05/19/2016  . Type 2 diabetes mellitus with diabetic neuropathy, unspecified ([place]) 05/06/2016  . Vertigo 05/06/2016  . Controlled type 2 diabetes mellitus with hyperglycemia ([place]) 04/23/2016  . Hypertension 04/23/2016  . Normochromic normocytic anemia 04/23/2016  . Syncope 04/23/2016    Current Outpatient  Medications:  .  acetaminophen (TYLENOL) 500 MG tablet, Take 500-1,000 mg by mouth every 6 (six) hours as needed for mild pain or headache. , Disp: , Rfl:  .  allopurinol (ZYLOPRIM) 100 MG tablet, Take 2 tablets (200 mg total) by mouth at bedtime. (Patient not taking: Reported on 01/08/2020), Disp: 30 tablet, Rfl: 1 .  aspirin EC 81 MG tablet, Take 81 mg by mouth in the morning.  (Patient not taking: Reported on 01/08/2020), Disp: , Rfl:  .  atorvastatin (LIPITOR) 80 MG tablet, Take 80 mg by mouth at bedtime.  (Patient not taking: Reported on 01/08/2020), Disp: , Rfl:  .  benzonatate (TESSALON) 100 MG capsule, Take 1 capsule (100 mg total) by mouth 2 (two) times daily as needed for cough. (Patient not taking: Reported on 01/15/2020), Disp: 20 capsule, Rfl: 0 .  DULoxetine (CYMBALTA) 30 MG capsule, Take 1 capsule (30 mg total) by mouth daily., Disp: 30 capsule, Rfl: 3 .  fluticasone (FLONASE) 50 MCG/ACT nasal spray, Place 2 sprays into both nostrils daily as needed. , Disp: , Rfl:  .  gabapentin (NEURONTIN) 100 MG capsule, TAKE 1 CAPSULE (100 MG TOTAL) BY MOUTH 2 (TWO) TIMES DAILY. (Patient taking differently: Take 100 mg by mouth 2 (two) times daily. ), Disp: 60 capsule, Rfl: 2 .  hydrALAZINE (APRESOLINE) 25 MG tablet, Take 1 tablet (25 mg total) by mouth 3 (three) times daily. (Patient not taking: Reported on 12/11/2019), Disp: 90 tablet, Rfl: 0 .  hydrOXYzine (ATARAX/VISTARIL) 25 MG tablet, Take 1 tablet (25 mg total) by mouth 2 (two) times daily as needed for itching. (Patient not  taking: Reported on 12/18/2019), Disp: 60 tablet, Rfl: 1 .  lansoprazole (PREVACID) 15 MG capsule, TAKE 1 CAPSULE (15 MG TOTAL) BY MOUTH DAILY. (Patient not taking: Reported on 01/23/2020), Disp: 30 capsule, Rfl: 2 .  LORazepam (ATIVAN) 0.5 MG tablet, Take 1 tablet (0.5 mg total) by mouth every 4 (four) hours as needed for anxiety. May crush, mix with water and give sublingually if needed., Disp: 42 tablet, Rfl: 0 .  Misc.  Devices MISC, Portable oxygen concentrator.  Diagnosis-chronic respiratory failure., Disp: 1 each, Rfl: 0 .  Misc. Devices MISC, Rollaor with seat. Dx: Congestive Heart Failure, Disp: 1 each, Rfl: 0 .  Misc. Devices MISC, Scale; Dx - CHF, Disp: 1 each, Rfl: 0 .  Misc. Devices [facility] bed.  Diagnosis CHF.  Lifetime use.  Weight 165 lbs., Disp: 1 each, Rfl: 0 .  Misc. Devices MISC, Shower chair DX CHF, Disp: 1 each, Rfl: 0 .  Misc. Devices MISC, Manual wheelchai with cushion, anti - tippers, brakes and lock.  Diagnosis congestive heart failure.  Weight 140 pounds., Disp: 1 each, Rfl: 0 .  morphine (ROXANOL) 20 MG/ML concentrated solution, Take 0.25 mLs (5 mg total) by mouth every 4 (four) hours as needed for shortness of breath. (Patient not taking: Reported on 01/23/2020), Disp: 30 mL, Rfl: 0 .  Multiple Vitamins-Minerals (MULTIVITAMIN WITH MINERALS) tablet, Take 1 tablet by mouth daily with breakfast.  (Patient not taking: Reported on 12/25/2019), Disp: , Rfl:  .  OXYGEN, Inhale 4 L/min into the lungs continuous., Disp: , Rfl:  .  polyethylene glycol (MIRALAX / GLYCOLAX) 17 g packet, Take 17 g by mouth daily as needed for moderate constipation. (Patient not taking: Reported on 12/18/2019), Disp: 30 each, Rfl: 1 .  potassium chloride SA (KLOR-CON) 20 MEQ tablet, Take 1 tablet (20 mEq total) by mouth daily for 7 days. (Patient not taking: Reported on 01/23/2020), Disp: 7 tablet, Rfl: 0 .  primidone (MYSOLINE) 50 MG tablet, Take 1 tablet (50 mg total) by mouth in the morning and at bedtime. (Patient not taking: Reported on 12/25/2019), Disp: 180 tablet, Rfl: 1 .  promethazine (PHENERGAN) 25 MG tablet, Take 1 tablet (25 mg total) by mouth every 8 (eight) hours as needed for nausea or vomiting. (Patient not taking: Reported on 12/25/2019), Disp: 30 tablet, Rfl: 1 .  SUPER B COMPLEX/C CAPS, Take 1 capsule by mouth daily.  (Patient not taking: Reported on 12/11/2019), Disp: , Rfl:  .  torsemide (DEMADEX) 20  MG tablet, Take 2 tablets (40 mg total) by mouth 2 (two) times daily., Disp: 120 tablet, Rfl: 3 .  Vitamin D, Ergocalciferol, (DRISDOL) 1.25 MG (50000 UNIT) CAPS capsule, Take 50,000 Units by mouth every Sunday.  (Patient not taking: Reported on 12/11/2019), Disp: , Rfl:  Allergies  Allergen Reactions  . Garlic Shortness Of Breath, Itching, Swelling and Other (See Comments)    "Raw garlic" = Hand itching and swelling  . Isosorbide Other (See Comments)    Sneezing and runny nose  . Latex Itching  . Morphine And Related Itching and Other (See Comments)    Headache   . Other Itching and Other (See Comments)    Reaction to newspaper ink- Headaches, also     Social History   Socioeconomic History  . Marital status: Widowed    Spouse name: Not on file  . Number of children: Not on file  . Years of education: Not on file  . Highest education level: Not on file  Occupational  History  . Not on file  Tobacco Use  . Smoking status: Never Smoker  . Smokeless tobacco: Never Used  Vaping Use  . Vaping Use: Never used  Substance and Sexual Activity  . Alcohol use: No  . Drug use: No  . Sexual activity: Not Currently    Birth control/protection: None  Other Topics Concern  . Not on file  Social History Narrative  . Not on file   Social Determinants of Health   Financial Resource Strain:   . Difficulty of Paying Living Expenses: Not on file  Food Insecurity: No Food Insecurity  . Worried About Charity fundraiser in the Last Year: Never true  . Ran Out of Food in the Last Year: Never true  Transportation Needs:   . Lack of Transportation (Medical): Not on file  . Lack of Transportation (Non-Medical): Not on file  Physical Activity:   . Days of Exercise per Week: Not on file  . Minutes of Exercise per Session: Not on file  Stress:   . Feeling of Stress : Not on file  Social Connections:   . Frequency of Communication with Friends and Family: Not on file  . Frequency of Social  Gatherings with Friends and Family: Not on file  . Attends Religious Services: Not on file  . Active Member of Clubs or Organizations: Not on file  . Attends Archivist Meetings: Not on file  . Marital Status: Not on file  Intimate Partner Violence:   . Fear of Current or Ex-Partner: Not on file  . Emotionally Abused: Not on file  . Physically Abused: Not on file  . Sexually Abused: Not on file    Physical Exam Vitals reviewed.  Constitutional:      Appearance: She is normal weight.  HENT:     Head: Normocephalic.     Nose: Nose normal.     Mouth/Throat:     Mouth: Mucous membranes are moist.     Pharynx: Oropharynx is clear.  Eyes:     Pupils: Pupils are equal, round, and reactive to light.  Cardiovascular:     Rate and Rhythm: Normal rate and regular rhythm.     Pulses: Normal pulses.     Heart sounds: Normal heart sounds.  Pulmonary:     Effort: Pulmonary effort is normal.     Breath sounds: Normal breath sounds.  Abdominal:     General: Abdomen is flat.     Palpations: Abdomen is soft.  Musculoskeletal:        General: Normal range of motion.     Cervical back: Normal range of motion.     Right lower leg: No edema.     Left lower leg: No edema.  Skin:    General: Skin is warm and dry.     Capillary Refill: Capillary refill takes less than 2 seconds.  Neurological:     General: No focal deficit present.     Mental Status: She is alert. Mental status is at baseline.  Psychiatric:        Mood and Affect: Mood normal.    Arrived for home visit for Jahna who was alert and oriented seated on the edge of her bed reporting she feels good. Tremeka was compliant with most of her medications over the last week. Nevea was reminded the importance of taking her medications daily. She verbalized understanding. Vitals were obtained and are as noted. Hassie's daughter reports that her appetite has improved over the  last several weeks. No edema noted, lung sounds clear.  Pill box filled accordingly. Imari missed her scheduled neurological appointment yesterday, [name] (Royanne's daughter) reports she will reschedule. Dynasia agreed to home visit in one week. Home visit completed.   Refills:  Gabapentin      No future appointments.   ACTION: Home visit completed Next visit planned for one week

## 2020-02-03 ENCOUNTER — Ambulatory Visit: Payer: Medicare Other | Admitting: Neurology

## 2020-02-12 ENCOUNTER — Telehealth: Payer: Self-pay | Admitting: Family Medicine

## 2020-02-12 NOTE — Telephone Encounter (Signed)
Copied from [place] [phone] [id number]. Topic: General - Other >> Feb 12, 2020  2:43 PM Yvette Rack wrote: Reason for CRM: Pt daughter [name] called to speak with pt social worker Opal Sidles. [name] requests call back [phone]

## 2020-02-12 NOTE — Telephone Encounter (Signed)
Call returned to [facility]. She said that her mother will not eat or take her medications, She has also refused help from the aides. [name] said she is " all swollen", especially her legs. she is complaining of neck pain and won't take tylenol because she said that hurts her stomach but now she is requesting advil. She wanted Dr Smitty Pluck recommendation prior to giving her advil. She told her mother that the advil may bother her stomach more and she still wants it. she is followed by [facility] hospice. Informed her that Dr Margarita Rana would be notified of the concern.     Call returned to [facility] and informed her that Dr Margarita Rana would not recommend Advil due to her chronic kidney disease. She can obtain OTC voltaren gel, lidoderm patches. [name] said she she understood and would go to [facility] today to check on the gel/patches.  She was very appreciative of the call back.

## 2020-02-19 ENCOUNTER — Other Ambulatory Visit (HOSPITAL_COMMUNITY): Payer: Self-pay | Admitting: Internal Medicine

## 2020-02-19 ENCOUNTER — Telehealth: Payer: Self-pay

## 2020-02-19 ENCOUNTER — Other Ambulatory Visit (HOSPITAL_COMMUNITY): Payer: Self-pay

## 2020-02-19 MED ORDER — GABAPENTIN 100 MG PO CAPS
200.0000 mg | ORAL_CAPSULE | Freq: Two times a day (BID) | ORAL | 2 refills | Status: DC
Start: 2020-02-19 — End: 2020-03-28

## 2020-02-19 NOTE — Progress Notes (Signed)
Paramedicine Encounter    Patient ID: Kara Hanson, female    DOB: [birth year], 64 y.o.   MRN: [id number]   Patient Care Team: [name], MD as PCP - General (Family Medicine) Jerline Pain, MD as PCP - Cardiology (Cardiology)  Patient Active Problem List   Diagnosis Date Noted  . DNR (do not resuscitate)   . DNI (do not intubate)   . Advanced directives, counseling/discussion   . Shortness of breath   . Abdominal pain 11/19/2019  . CKD (chronic kidney disease), stage IV ([place]) 11/19/2019  . Palliative care by specialist   . Goals of care, counseling/discussion   . Cardiorenal syndrome   . Hyponatremia 09/26/2019  . CHF exacerbation ([place]) 09/26/2019  . Acute on chronic systolic CHF (congestive heart failure) ([place]) 05/19/2019  . Elevated troponin 04/18/2019  . NSTEMI (non-ST elevated myocardial infarction) ([place]) 02/09/2019  . S/P thoracentesis   . Atelectasis   . Chronic respiratory failure ([place])   . CAP (community acquired pneumonia) 10/31/2018  . COVID-19 virus infection 07/20/2018  . Pneumonia due to COVID-19 virus 07/20/2018  . Vitamin D deficiency 04/02/2018  . Spinal stenosis 03/29/2018  . Acute on chronic respiratory failure with hypoxia ([place]) 03/20/2018  . Controlled type 2 diabetes with neuropathy ([place]) 03/20/2018  . Acute on chronic combined systolic and diastolic CHF (congestive heart failure) ([place]) 11/27/2017  . AKI (acute kidney injury) ([place]) 11/27/2017  . Acute respiratory distress 11/27/2017  . Bulging lumbar disc 11/14/2017  . Gout 11/14/2017  . Hyperlipidemia LDL goal <70 09/30/2016  . Chest pain 06/17/2016  . Stress-induced cardiomyopathy 05/19/2016  . Type 2 diabetes mellitus with diabetic neuropathy, unspecified ([place]) 05/06/2016  . Vertigo 05/06/2016  . Controlled type 2 diabetes mellitus with hyperglycemia ([place]) 04/23/2016  . Hypertension 04/23/2016  . Normochromic normocytic anemia 04/23/2016  . Syncope 04/23/2016    Current Outpatient  Medications:  .  acetaminophen (TYLENOL) 500 MG tablet, Take 500-1,000 mg by mouth every 6 (six) hours as needed for mild pain or headache. , Disp: , Rfl:  .  allopurinol (ZYLOPRIM) 100 MG tablet, Take 2 tablets (200 mg total) by mouth at bedtime. (Patient not taking: Reported on 01/08/2020), Disp: 30 tablet, Rfl: 1 .  aspirin EC 81 MG tablet, Take 81 mg by mouth in the morning.  (Patient not taking: Reported on 01/08/2020), Disp: , Rfl:  .  atorvastatin (LIPITOR) 80 MG tablet, Take 80 mg by mouth at bedtime.  (Patient not taking: Reported on 01/08/2020), Disp: , Rfl:  .  benzonatate (TESSALON) 100 MG capsule, Take 1 capsule (100 mg total) by mouth 2 (two) times daily as needed for cough. (Patient not taking: Reported on 01/15/2020), Disp: 20 capsule, Rfl: 0 .  DULoxetine (CYMBALTA) 30 MG capsule, Take 1 capsule (30 mg total) by mouth daily., Disp: 30 capsule, Rfl: 3 .  fluticasone (FLONASE) 50 MCG/ACT nasal spray, Place 2 sprays into both nostrils daily as needed. , Disp: , Rfl:  .  gabapentin (NEURONTIN) 100 MG capsule, TAKE 1 CAPSULE (100 MG TOTAL) BY MOUTH 2 (TWO) TIMES DAILY. (Patient taking differently: Take 100 mg by mouth 2 (two) times daily. ), Disp: 60 capsule, Rfl: 2 .  hydrALAZINE (APRESOLINE) 25 MG tablet, Take 1 tablet (25 mg total) by mouth 3 (three) times daily. (Patient not taking: Reported on 12/11/2019), Disp: 90 tablet, Rfl: 0 .  hydrOXYzine (ATARAX/VISTARIL) 25 MG tablet, Take 1 tablet (25 mg total) by mouth 2 (two) times daily as needed for itching. (Patient not  taking: Reported on 12/18/2019), Disp: 60 tablet, Rfl: 1 .  lansoprazole (PREVACID) 15 MG capsule, TAKE 1 CAPSULE (15 MG TOTAL) BY MOUTH DAILY. (Patient not taking: Reported on 01/23/2020), Disp: 30 capsule, Rfl: 2 .  LORazepam (ATIVAN) 0.5 MG tablet, Take 1 tablet (0.5 mg total) by mouth every 4 (four) hours as needed for anxiety. May crush, mix with water and give sublingually if needed., Disp: 42 tablet, Rfl: 0 .  Misc.  Devices MISC, Portable oxygen concentrator.  Diagnosis-chronic respiratory failure., Disp: 1 each, Rfl: 0 .  Misc. Devices MISC, Rollaor with seat. Dx: Congestive Heart Failure, Disp: 1 each, Rfl: 0 .  Misc. Devices MISC, Scale; Dx - CHF, Disp: 1 each, Rfl: 0 .  Misc. Devices [facility] bed.  Diagnosis CHF.  Lifetime use.  Weight 165 lbs., Disp: 1 each, Rfl: 0 .  Misc. Devices MISC, Shower chair DX CHF, Disp: 1 each, Rfl: 0 .  Misc. Devices MISC, Manual wheelchai with cushion, anti - tippers, brakes and lock.  Diagnosis congestive heart failure.  Weight 140 pounds., Disp: 1 each, Rfl: 0 .  morphine (ROXANOL) 20 MG/ML concentrated solution, Take 0.25 mLs (5 mg total) by mouth every 4 (four) hours as needed for shortness of breath. (Patient not taking: Reported on 01/23/2020), Disp: 30 mL, Rfl: 0 .  Multiple Vitamins-Minerals (MULTIVITAMIN WITH MINERALS) tablet, Take 1 tablet by mouth daily with breakfast.  (Patient not taking: Reported on 12/25/2019), Disp: , Rfl:  .  OXYGEN, Inhale 4 L/min into the lungs continuous., Disp: , Rfl:  .  polyethylene glycol (MIRALAX / GLYCOLAX) 17 g packet, Take 17 g by mouth daily as needed for moderate constipation. (Patient not taking: Reported on 12/18/2019), Disp: 30 each, Rfl: 1 .  potassium chloride SA (KLOR-CON) 20 MEQ tablet, Take 1 tablet (20 mEq total) by mouth daily for 7 days. (Patient not taking: Reported on 01/23/2020), Disp: 7 tablet, Rfl: 0 .  primidone (MYSOLINE) 50 MG tablet, Take 1 tablet (50 mg total) by mouth in the morning and at bedtime. (Patient not taking: Reported on 12/25/2019), Disp: 180 tablet, Rfl: 1 .  promethazine (PHENERGAN) 25 MG tablet, Take 1 tablet (25 mg total) by mouth every 8 (eight) hours as needed for nausea or vomiting. (Patient not taking: Reported on 12/25/2019), Disp: 30 tablet, Rfl: 1 .  SUPER B COMPLEX/C CAPS, Take 1 capsule by mouth daily.  (Patient not taking: Reported on 12/11/2019), Disp: , Rfl:  .  torsemide (DEMADEX) 20  MG tablet, Take 2 tablets (40 mg total) by mouth 2 (two) times daily., Disp: 120 tablet, Rfl: 3 .  Vitamin D, Ergocalciferol, (DRISDOL) 1.25 MG (50000 UNIT) CAPS capsule, Take 50,000 Units by mouth every Sunday.  (Patient not taking: Reported on 12/11/2019), Disp: , Rfl:  Allergies  Allergen Reactions  . Garlic Shortness Of Breath, Itching, Swelling and Other (See Comments)    "Raw garlic" = Hand itching and swelling  . Isosorbide Other (See Comments)    Sneezing and runny nose  . Latex Itching  . Morphine And Related Itching and Other (See Comments)    Headache   . Other Itching and Other (See Comments)    Reaction to newspaper ink- Headaches, also     Social History   Socioeconomic History  . Marital status: Widowed    Spouse name: Not on file  . Number of children: Not on file  . Years of education: Not on file  . Highest education level: Not on file  Occupational  History  . Not on file  Tobacco Use  . Smoking status: Never Smoker  . Smokeless tobacco: Never Used  Vaping Use  . Vaping Use: Never used  Substance and Sexual Activity  . Alcohol use: No  . Drug use: No  . Sexual activity: Not Currently    Birth control/protection: None  Other Topics Concern  . Not on file  Social History Narrative  . Not on file   Social Determinants of Health   Financial Resource Strain:   . Difficulty of Paying Living Expenses: Not on file  Food Insecurity: No Food Insecurity  . Worried About Charity fundraiser in the Last Year: Never true  . Ran Out of Food in the Last Year: Never true  Transportation Needs:   . Lack of Transportation (Medical): Not on file  . Lack of Transportation (Non-Medical): Not on file  Physical Activity:   . Days of Exercise per Week: Not on file  . Minutes of Exercise per Session: Not on file  Stress:   . Feeling of Stress : Not on file  Social Connections:   . Frequency of Communication with Friends and Family: Not on file  . Frequency of Social  Gatherings with Friends and Family: Not on file  . Attends Religious Services: Not on file  . Active Member of Clubs or Organizations: Not on file  . Attends Archivist Meetings: Not on file  . Marital Status: Not on file  Intimate Partner Violence:   . Fear of Current or Ex-Partner: Not on file  . Emotionally Abused: Not on file  . Physically Abused: Not on file  . Sexually Abused: Not on file    Physical Exam Vitals reviewed.  HENT:     Head: Normocephalic.     Nose: Nose normal.     Mouth/Throat:     Mouth: Mucous membranes are moist.     Pharynx: Oropharynx is clear.  Eyes:     Pupils: Pupils are equal, round, and reactive to light.  Cardiovascular:     Rate and Rhythm: Normal rate and regular rhythm.     Pulses: Normal pulses.     Heart sounds: Normal heart sounds.  Pulmonary:     Effort: Pulmonary effort is normal.     Breath sounds: Normal breath sounds.  Abdominal:     General: There is distension.     Palpations: Abdomen is soft.  Musculoskeletal:        General: Swelling present. Normal range of motion.     Cervical back: Normal range of motion. Tenderness present.     Right lower leg: Edema present.     Left lower leg: Edema present.  Skin:    General: Skin is warm and dry.     Capillary Refill: Capillary refill takes less than 2 seconds.  Neurological:     General: No focal deficit present.     Mental Status: She is alert. Mental status is at baseline.  Psychiatric:        Mood and Affect: Mood normal.      Arrived for home visit for Elicia who was alert and oriented reporting she has been having some neck tenderness due to the way she has been sleeping sitting up. She has been applying lidocaine patches and says this is helpful. Willie also reports she has not been taking her medications over the last two weeks, only some doses here and there. Tanishia has some increased swelling in her abdomen and  lower legs. I educated Shavone intensely on the  importance of taking her medications to assure we stay out of the hospital. Rayna verbalized understanding. Medications were reviewed and confirmed. Pill box filled. Shaynah agreed with bubble packs and I obtained medication and took them to [facility] for same.  Home visit complete I will see Masiya in one week.   Refills- Torsemide    No future appointments.   ACTION: Home visit completed Next visit planned for one week

## 2020-02-19 NOTE — Telephone Encounter (Signed)
Done

## 2020-02-19 NOTE — Telephone Encounter (Signed)
Message received from [name], EMT stating that hospice told the patient to take gabapentin 2 tablets twice daily.  [facility] will need a new prescription if this is what the patient is to continue with.  Please advise

## 2020-02-20 NOTE — Telephone Encounter (Signed)
[name], EMT notified that the order has been sent to pharmacy

## 2020-02-26 ENCOUNTER — Other Ambulatory Visit: Payer: Self-pay

## 2020-02-26 NOTE — Progress Notes (Signed)
Paramedicine Encounter    Patient ID: Kara Hanson, female    DOB: February 13, 1956, 64 y.o.   MRN: [id number]   Patient Care Team: [name], MD as PCP - General (Family Medicine) Jerline Pain, MD as PCP - Cardiology (Cardiology)  Patient Active Problem List   Diagnosis Date Noted  . DNR (do not resuscitate)   . DNI (do not intubate)   . Advanced directives, counseling/discussion   . Shortness of breath   . Abdominal pain 11/19/2019  . CKD (chronic kidney disease), stage IV ([place]) 11/19/2019  . Palliative care by specialist   . Goals of care, counseling/discussion   . Cardiorenal syndrome   . Hyponatremia 09/26/2019  . CHF exacerbation ([place]) 09/26/2019  . Acute on chronic systolic CHF (congestive heart failure) ([place]) 05/19/2019  . Elevated troponin 04/18/2019  . NSTEMI (non-ST elevated myocardial infarction) ([place]) 02/09/2019  . S/P thoracentesis   . Atelectasis   . Chronic respiratory failure ([place])   . CAP (community acquired pneumonia) 10/31/2018  . COVID-19 virus infection 07/20/2018  . Pneumonia due to COVID-19 virus 07/20/2018  . Vitamin D deficiency 04/02/2018  . Spinal stenosis 03/29/2018  . Acute on chronic respiratory failure with hypoxia ([place]) 03/20/2018  . Controlled type 2 diabetes with neuropathy ([place]) 03/20/2018  . Acute on chronic combined systolic and diastolic CHF (congestive heart failure) ([place]) 11/27/2017  . AKI (acute kidney injury) ([place]) 11/27/2017  . Acute respiratory distress 11/27/2017  . Bulging lumbar disc 11/14/2017  . Gout 11/14/2017  . Hyperlipidemia LDL goal <70 09/30/2016  . Chest pain 06/17/2016  . Stress-induced cardiomyopathy 05/19/2016  . Type 2 diabetes mellitus with diabetic neuropathy, unspecified ([place]) 05/06/2016  . Vertigo 05/06/2016  . Controlled type 2 diabetes mellitus with hyperglycemia ([place]) 04/23/2016  . Hypertension 04/23/2016  . Normochromic normocytic anemia 04/23/2016  . Syncope 04/23/2016    Current Outpatient  Medications:  .  acetaminophen (TYLENOL) 500 MG tablet, Take 500-1,000 mg by mouth every 6 (six) hours as needed for mild pain or headache. , Disp: , Rfl:  .  allopurinol (ZYLOPRIM) 100 MG tablet, Take 2 tablets (200 mg total) by mouth at bedtime. (Patient not taking: Reported on 01/08/2020), Disp: 30 tablet, Rfl: 1 .  aspirin EC 81 MG tablet, Take 81 mg by mouth in the morning.  (Patient not taking: Reported on 01/08/2020), Disp: , Rfl:  .  atorvastatin (LIPITOR) 80 MG tablet, Take 80 mg by mouth at bedtime.  (Patient not taking: Reported on 01/08/2020), Disp: , Rfl:  .  benzonatate (TESSALON) 100 MG capsule, Take 1 capsule (100 mg total) by mouth 2 (two) times daily as needed for cough. (Patient not taking: Reported on 01/15/2020), Disp: 20 capsule, Rfl: 0 .  DULoxetine (CYMBALTA) 30 MG capsule, Take 1 capsule (30 mg total) by mouth daily., Disp: 30 capsule, Rfl: 3 .  fluticasone (FLONASE) 50 MCG/ACT nasal spray, Place 2 sprays into both nostrils daily as needed. , Disp: , Rfl:  .  gabapentin (NEURONTIN) 100 MG capsule, Take 2 capsules (200 mg total) by mouth 2 (two) times daily., Disp: 120 capsule, Rfl: 2 .  hydrALAZINE (APRESOLINE) 25 MG tablet, Take 1 tablet (25 mg total) by mouth 3 (three) times daily. (Patient not taking: Reported on 12/11/2019), Disp: 90 tablet, Rfl: 0 .  hydrOXYzine (ATARAX/VISTARIL) 25 MG tablet, Take 1 tablet (25 mg total) by mouth 2 (two) times daily as needed for itching. (Patient not taking: Reported on 12/18/2019), Disp: 60 tablet, Rfl: 1 .  lansoprazole (PREVACID)  15 MG capsule, TAKE 1 CAPSULE (15 MG TOTAL) BY MOUTH DAILY. (Patient not taking: Reported on 01/23/2020), Disp: 30 capsule, Rfl: 2 .  LORazepam (ATIVAN) 0.5 MG tablet, Take 1 tablet (0.5 mg total) by mouth every 4 (four) hours as needed for anxiety. May crush, mix with water and give sublingually if needed., Disp: 42 tablet, Rfl: 0 .  Misc. Devices MISC, Portable oxygen concentrator.  Diagnosis-chronic respiratory  failure., Disp: 1 each, Rfl: 0 .  Misc. Devices MISC, [place] with seat. Dx: Congestive Heart Failure, Disp: 1 each, Rfl: 0 .  Misc. Devices MISC, Scale; Dx - CHF, Disp: 1 each, Rfl: 0 .  Misc. Devices [facility] bed.  Diagnosis CHF.  Lifetime use.  Weight 165 lbs., Disp: 1 each, Rfl: 0 .  Misc. Devices MISC, Shower chair DX CHF, Disp: 1 each, Rfl: 0 .  Misc. Devices MISC, Manual wheelchai with cushion, anti - tippers, brakes and lock.  Diagnosis congestive heart failure.  Weight 140 pounds., Disp: 1 each, Rfl: 0 .  morphine (ROXANOL) 20 MG/ML concentrated solution, Take 0.25 mLs (5 mg total) by mouth every 4 (four) hours as needed for shortness of breath. (Patient not taking: Reported on 01/23/2020), Disp: 30 mL, Rfl: 0 .  Multiple Vitamins-Minerals (MULTIVITAMIN WITH MINERALS) tablet, Take 1 tablet by mouth daily with breakfast.  (Patient not taking: Reported on 12/25/2019), Disp: , Rfl:  .  OXYGEN, Inhale 4 L/min into the lungs continuous., Disp: , Rfl:  .  polyethylene glycol (MIRALAX / GLYCOLAX) 17 g packet, Take 17 g by mouth daily as needed for moderate constipation. (Patient not taking: Reported on 12/18/2019), Disp: 30 each, Rfl: 1 .  potassium chloride SA (KLOR-CON) 20 MEQ tablet, Take 1 tablet (20 mEq total) by mouth daily for 7 days. (Patient not taking: Reported on 01/23/2020), Disp: 7 tablet, Rfl: 0 .  primidone (MYSOLINE) 50 MG tablet, Take 1 tablet (50 mg total) by mouth in the morning and at bedtime. (Patient not taking: Reported on 12/25/2019), Disp: 180 tablet, Rfl: 1 .  promethazine (PHENERGAN) 25 MG tablet, Take 1 tablet (25 mg total) by mouth every 8 (eight) hours as needed for nausea or vomiting. (Patient not taking: Reported on 12/25/2019), Disp: 30 tablet, Rfl: 1 .  SUPER B COMPLEX/C CAPS, Take 1 capsule by mouth daily.  (Patient not taking: Reported on 12/11/2019), Disp: , Rfl:  .  torsemide (DEMADEX) 20 MG tablet, Take 2 tablets (40 mg total) by mouth 2 (two) times daily.,  Disp: 120 tablet, Rfl: 3 .  Vitamin D, Ergocalciferol, (DRISDOL) 1.25 MG (50000 UNIT) CAPS capsule, Take 50,000 Units by mouth every Sunday.  (Patient not taking: Reported on 12/11/2019), Disp: , Rfl:  Allergies  Allergen Reactions  . Garlic Shortness Of Breath, Itching, Swelling and Other (See Comments)    "Raw garlic" = Hand itching and swelling  . Isosorbide Other (See Comments)    Sneezing and runny nose  . Latex Itching  . Morphine And Related Itching and Other (See Comments)    Headache   . Other Itching and Other (See Comments)    Reaction to newspaper ink- Headaches, also     Social History   Socioeconomic History  . Marital status: Widowed    Spouse name: Not on file  . Number of children: Not on file  . Years of education: Not on file  . Highest education level: Not on file  Occupational History  . Not on file  Tobacco Use  . Smoking status:  Never Smoker  . Smokeless tobacco: Never Used  Vaping Use  . Vaping Use: Never used  Substance and Sexual Activity  . Alcohol use: No  . Drug use: No  . Sexual activity: Not Currently    Birth control/protection: None  Other Topics Concern  . Not on file  Social History Narrative  . Not on file   Social Determinants of Health   Financial Resource Strain:   . Difficulty of Paying Living Expenses: Not on file  Food Insecurity: No Food Insecurity  . Worried About Charity fundraiser in the Last Year: Never true  . Ran Out of Food in the Last Year: Never true  Transportation Needs:   . Lack of Transportation (Medical): Not on file  . Lack of Transportation (Non-Medical): Not on file  Physical Activity:   . Days of Exercise per Week: Not on file  . Minutes of Exercise per Session: Not on file  Stress:   . Feeling of Stress : Not on file  Social Connections:   . Frequency of Communication with Friends and Family: Not on file  . Frequency of Social Gatherings with Friends and Family: Not on file  . Attends Religious  Services: Not on file  . Active Member of Clubs or Organizations: Not on file  . Attends Archivist Meetings: Not on file  . Marital Status: Not on file  Intimate Partner Violence:   . Fear of Current or Ex-Partner: Not on file  . Emotionally Abused: Not on file  . Physically Abused: Not on file  . Sexually Abused: Not on file    Physical Exam Vitals reviewed.  Constitutional:      General: She is not in acute distress.    Appearance: She is not ill-appearing, toxic-appearing or diaphoretic.  HENT:     Head: Normocephalic.     Nose: Nose normal.     Mouth/Throat:     Mouth: Mucous membranes are moist.     Pharynx: Oropharynx is clear.  Eyes:     Pupils: Pupils are equal, round, and reactive to light.  Cardiovascular:     Rate and Rhythm: Normal rate and regular rhythm.     Pulses: Normal pulses.     Heart sounds: Normal heart sounds.  Pulmonary:     Effort: Pulmonary effort is normal.     Breath sounds: Normal breath sounds.  Musculoskeletal:        General: Swelling present. Normal range of motion.     Right lower leg: Edema present.     Left lower leg: Edema present.  Skin:    General: Skin is warm and dry.     Capillary Refill: Capillary refill takes less than 2 seconds.  Neurological:     General: No focal deficit present.     Mental Status: She is alert. Mental status is at baseline.  Psychiatric:        Mood and Affect: Mood normal.      Arrived for home visit for Kara Hanson who was alert and oriented seated on the edge of her hospital bed reporting she was feeling good today. Kara Hanson missed multiple doses of her medicines in her pill box last week. Bubble Packs delivered via [employer], these will begin today. AM dose given by me at 1145. I obtained vitals and reviewed same. Legs swollen with pitting edema noted. I explained in detail to patient the importance of taking her medicines morning and night is absolutely necessary to maintain and  keep her out  of the hospital. She verbalized understanding. I reviewed appointments with none noted. I expressed that we should follow up with HF soon, I will speak to clinic about same. I explained certain foods Kara Hanson should be eating, she agreed and understood. Kara Hanson was assisted into a upright seating position with legs elevated. Home visit complete. I will see Kara Hanson in one week.       No future appointments.   ACTION: Home visit completed Next visit planned for one week

## 2020-03-04 ENCOUNTER — Telehealth (HOSPITAL_COMMUNITY): Payer: Self-pay

## 2020-03-04 NOTE — Telephone Encounter (Signed)
Attempted to reach Kara Hanson today for home visit without success. I will reach out to patient's daughter and follow up next week.

## 2020-03-17 ENCOUNTER — Encounter (HOSPITAL_COMMUNITY): Payer: Self-pay | Admitting: Family Medicine

## 2020-03-17 ENCOUNTER — Emergency Department (HOSPITAL_COMMUNITY)

## 2020-03-17 ENCOUNTER — Other Ambulatory Visit: Payer: Self-pay

## 2020-03-17 ENCOUNTER — Inpatient Hospital Stay (HOSPITAL_COMMUNITY)
Admit: 2020-03-17 | Discharge: 2020-04-04 | DRG: 064 | Disposition: E | Attending: Internal Medicine | Admitting: Internal Medicine

## 2020-03-17 DIAGNOSIS — I25119 Atherosclerotic heart disease of native coronary artery with unspecified angina pectoris: Secondary | ICD-10-CM | POA: Diagnosis not present

## 2020-03-17 DIAGNOSIS — Z9114 Patient's other noncompliance with medication regimen: Secondary | ICD-10-CM

## 2020-03-17 DIAGNOSIS — J811 Chronic pulmonary edema: Secondary | ICD-10-CM | POA: Diagnosis not present

## 2020-03-17 DIAGNOSIS — I2510 Atherosclerotic heart disease of native coronary artery without angina pectoris: Secondary | ICD-10-CM | POA: Diagnosis present

## 2020-03-17 DIAGNOSIS — E1122 Type 2 diabetes mellitus with diabetic chronic kidney disease: Secondary | ICD-10-CM | POA: Diagnosis present

## 2020-03-17 DIAGNOSIS — I429 Cardiomyopathy, unspecified: Secondary | ICD-10-CM | POA: Diagnosis present

## 2020-03-17 DIAGNOSIS — Z823 Family history of stroke: Secondary | ICD-10-CM

## 2020-03-17 DIAGNOSIS — I10 Essential (primary) hypertension: Secondary | ICD-10-CM | POA: Diagnosis not present

## 2020-03-17 DIAGNOSIS — Z794 Long term (current) use of insulin: Secondary | ICD-10-CM

## 2020-03-17 DIAGNOSIS — Z885 Allergy status to narcotic agent status: Secondary | ICD-10-CM

## 2020-03-17 DIAGNOSIS — Z91018 Allergy to other foods: Secondary | ICD-10-CM

## 2020-03-17 DIAGNOSIS — Z8616 Personal history of COVID-19: Secondary | ICD-10-CM | POA: Diagnosis not present

## 2020-03-17 DIAGNOSIS — R4701 Aphasia: Secondary | ICD-10-CM | POA: Diagnosis present

## 2020-03-17 DIAGNOSIS — N179 Acute kidney failure, unspecified: Secondary | ICD-10-CM | POA: Diagnosis not present

## 2020-03-17 DIAGNOSIS — E7800 Pure hypercholesterolemia, unspecified: Secondary | ICD-10-CM | POA: Diagnosis present

## 2020-03-17 DIAGNOSIS — Z6831 Body mass index (BMI) 31.0-31.9, adult: Secondary | ICD-10-CM

## 2020-03-17 DIAGNOSIS — R0902 Hypoxemia: Secondary | ICD-10-CM | POA: Diagnosis not present

## 2020-03-17 DIAGNOSIS — N184 Chronic kidney disease, stage 4 (severe): Secondary | ICD-10-CM | POA: Diagnosis present

## 2020-03-17 DIAGNOSIS — R5381 Other malaise: Secondary | ICD-10-CM | POA: Diagnosis not present

## 2020-03-17 DIAGNOSIS — E559 Vitamin D deficiency, unspecified: Secondary | ICD-10-CM | POA: Diagnosis present

## 2020-03-17 DIAGNOSIS — I959 Hypotension, unspecified: Secondary | ICD-10-CM | POA: Diagnosis not present

## 2020-03-17 DIAGNOSIS — S06340A Traumatic hemorrhage of right cerebrum without loss of consciousness, initial encounter: Secondary | ICD-10-CM | POA: Diagnosis not present

## 2020-03-17 DIAGNOSIS — E669 Obesity, unspecified: Secondary | ICD-10-CM | POA: Diagnosis present

## 2020-03-17 DIAGNOSIS — E876 Hypokalemia: Secondary | ICD-10-CM | POA: Diagnosis not present

## 2020-03-17 DIAGNOSIS — J189 Pneumonia, unspecified organism: Secondary | ICD-10-CM | POA: Diagnosis not present

## 2020-03-17 DIAGNOSIS — E1140 Type 2 diabetes mellitus with diabetic neuropathy, unspecified: Secondary | ICD-10-CM | POA: Diagnosis present

## 2020-03-17 DIAGNOSIS — J9621 Acute and chronic respiratory failure with hypoxia: Secondary | ICD-10-CM | POA: Diagnosis not present

## 2020-03-17 DIAGNOSIS — F419 Anxiety disorder, unspecified: Secondary | ICD-10-CM | POA: Diagnosis present

## 2020-03-17 DIAGNOSIS — I639 Cerebral infarction, unspecified: Secondary | ICD-10-CM | POA: Diagnosis not present

## 2020-03-17 DIAGNOSIS — G936 Cerebral edema: Secondary | ICD-10-CM | POA: Diagnosis present

## 2020-03-17 DIAGNOSIS — G4489 Other headache syndrome: Secondary | ICD-10-CM | POA: Diagnosis not present

## 2020-03-17 DIAGNOSIS — Z79899 Other long term (current) drug therapy: Secondary | ICD-10-CM

## 2020-03-17 DIAGNOSIS — I6602 Occlusion and stenosis of left middle cerebral artery: Secondary | ICD-10-CM | POA: Diagnosis present

## 2020-03-17 DIAGNOSIS — Z9981 Dependence on supplemental oxygen: Secondary | ICD-10-CM

## 2020-03-17 DIAGNOSIS — I130 Hypertensive heart and chronic kidney disease with heart failure and stage 1 through stage 4 chronic kidney disease, or unspecified chronic kidney disease: Secondary | ICD-10-CM | POA: Diagnosis present

## 2020-03-17 DIAGNOSIS — I700 Atherosclerosis of aorta: Secondary | ICD-10-CM | POA: Diagnosis present

## 2020-03-17 DIAGNOSIS — Z20822 Contact with and (suspected) exposure to covid-19: Secondary | ICD-10-CM | POA: Diagnosis present

## 2020-03-17 DIAGNOSIS — M1711 Unilateral primary osteoarthritis, right knee: Secondary | ICD-10-CM | POA: Diagnosis not present

## 2020-03-17 DIAGNOSIS — I509 Heart failure, unspecified: Secondary | ICD-10-CM | POA: Diagnosis not present

## 2020-03-17 DIAGNOSIS — Z91040 Latex allergy status: Secondary | ICD-10-CM | POA: Diagnosis not present

## 2020-03-17 DIAGNOSIS — R519 Headache, unspecified: Secondary | ICD-10-CM | POA: Diagnosis not present

## 2020-03-17 DIAGNOSIS — W19XXXA Unspecified fall, initial encounter: Secondary | ICD-10-CM | POA: Diagnosis present

## 2020-03-17 DIAGNOSIS — Z66 Do not resuscitate: Secondary | ICD-10-CM | POA: Diagnosis not present

## 2020-03-17 DIAGNOSIS — R609 Edema, unspecified: Secondary | ICD-10-CM | POA: Diagnosis not present

## 2020-03-17 DIAGNOSIS — I110 Hypertensive heart disease with heart failure: Secondary | ICD-10-CM | POA: Diagnosis not present

## 2020-03-17 DIAGNOSIS — Z833 Family history of diabetes mellitus: Secondary | ICD-10-CM

## 2020-03-17 DIAGNOSIS — I5043 Acute on chronic combined systolic (congestive) and diastolic (congestive) heart failure: Secondary | ICD-10-CM | POA: Diagnosis not present

## 2020-03-17 DIAGNOSIS — I616 Nontraumatic intracerebral hemorrhage, multiple localized: Secondary | ICD-10-CM | POA: Diagnosis not present

## 2020-03-17 DIAGNOSIS — Z7401 Bed confinement status: Secondary | ICD-10-CM | POA: Diagnosis not present

## 2020-03-17 DIAGNOSIS — Z7189 Other specified counseling: Secondary | ICD-10-CM | POA: Diagnosis not present

## 2020-03-17 DIAGNOSIS — E785 Hyperlipidemia, unspecified: Secondary | ICD-10-CM | POA: Diagnosis present

## 2020-03-17 DIAGNOSIS — Z9049 Acquired absence of other specified parts of digestive tract: Secondary | ICD-10-CM

## 2020-03-17 DIAGNOSIS — R7989 Other specified abnormal findings of blood chemistry: Secondary | ICD-10-CM | POA: Diagnosis present

## 2020-03-17 DIAGNOSIS — I6389 Other cerebral infarction: Secondary | ICD-10-CM | POA: Diagnosis not present

## 2020-03-17 DIAGNOSIS — J449 Chronic obstructive pulmonary disease, unspecified: Secondary | ICD-10-CM | POA: Diagnosis present

## 2020-03-17 DIAGNOSIS — M25551 Pain in right hip: Secondary | ICD-10-CM | POA: Diagnosis not present

## 2020-03-17 DIAGNOSIS — I517 Cardiomegaly: Secondary | ICD-10-CM | POA: Diagnosis not present

## 2020-03-17 DIAGNOSIS — E875 Hyperkalemia: Secondary | ICD-10-CM | POA: Diagnosis present

## 2020-03-17 DIAGNOSIS — R270 Ataxia, unspecified: Secondary | ICD-10-CM | POA: Diagnosis present

## 2020-03-17 DIAGNOSIS — I63412 Cerebral infarction due to embolism of left middle cerebral artery: Principal | ICD-10-CM | POA: Diagnosis present

## 2020-03-17 DIAGNOSIS — Z515 Encounter for palliative care: Secondary | ICD-10-CM | POA: Diagnosis not present

## 2020-03-17 DIAGNOSIS — H539 Unspecified visual disturbance: Secondary | ICD-10-CM | POA: Diagnosis present

## 2020-03-17 DIAGNOSIS — I5084 End stage heart failure: Secondary | ICD-10-CM | POA: Diagnosis present

## 2020-03-17 DIAGNOSIS — I739 Peripheral vascular disease, unspecified: Secondary | ICD-10-CM | POA: Diagnosis not present

## 2020-03-17 DIAGNOSIS — I252 Old myocardial infarction: Secondary | ICD-10-CM

## 2020-03-17 DIAGNOSIS — J90 Pleural effusion, not elsewhere classified: Secondary | ICD-10-CM | POA: Diagnosis not present

## 2020-03-17 DIAGNOSIS — Z8673 Personal history of transient ischemic attack (TIA), and cerebral infarction without residual deficits: Secondary | ICD-10-CM

## 2020-03-17 DIAGNOSIS — R000 Tachycardia, unspecified: Secondary | ICD-10-CM | POA: Diagnosis not present

## 2020-03-17 DIAGNOSIS — I63512 Cerebral infarction due to unspecified occlusion or stenosis of left middle cerebral artery: Secondary | ICD-10-CM | POA: Diagnosis not present

## 2020-03-17 DIAGNOSIS — R251 Tremor, unspecified: Secondary | ICD-10-CM | POA: Diagnosis present

## 2020-03-17 DIAGNOSIS — J9690 Respiratory failure, unspecified, unspecified whether with hypoxia or hypercapnia: Secondary | ICD-10-CM | POA: Diagnosis not present

## 2020-03-17 DIAGNOSIS — H748X3 Other specified disorders of middle ear and mastoid, bilateral: Secondary | ICD-10-CM | POA: Diagnosis not present

## 2020-03-17 LAB — URINALYSIS, ROUTINE W REFLEX MICROSCOPIC
Bacteria, UA: NONE SEEN
Bilirubin Urine: NEGATIVE
Glucose, UA: 50 mg/dL — AB
Ketones, ur: NEGATIVE mg/dL
Leukocytes,Ua: NEGATIVE
Nitrite: NEGATIVE
Protein, ur: >=300 mg/dL — AB
Specific Gravity, Urine: 1.011 (ref 1.005–1.030)
pH: 5 (ref 5.0–8.0)

## 2020-03-17 LAB — RESP PANEL BY RT-PCR (FLU A&B, COVID) ARPGX2
Influenza A by PCR: NEGATIVE
Influenza B by PCR: NEGATIVE
SARS Coronavirus 2 by RT PCR: NEGATIVE

## 2020-03-17 LAB — CBC WITH DIFFERENTIAL/PLATELET
Abs Immature Granulocytes: 0.02 10*3/uL (ref 0.00–0.07)
Basophils Absolute: 0 10*3/uL (ref 0.0–0.1)
Basophils Relative: 1 %
Eosinophils Absolute: 0.1 10*3/uL (ref 0.0–0.5)
Eosinophils Relative: 1 %
HCT: 39.7 % (ref 36.0–46.0)
Hemoglobin: 11.7 g/dL — ABNORMAL LOW (ref 12.0–15.0)
Immature Granulocytes: 0 %
Lymphocytes Relative: 9 %
Lymphs Abs: 0.5 10*3/uL — ABNORMAL LOW (ref 0.7–4.0)
MCH: 26.6 pg (ref 26.0–34.0)
MCHC: 29.5 g/dL — ABNORMAL LOW (ref 30.0–36.0)
MCV: 90.2 fL (ref 80.0–100.0)
Monocytes Absolute: 0.4 10*3/uL (ref 0.1–1.0)
Monocytes Relative: 7 %
Neutro Abs: 4.9 10*3/uL (ref 1.7–7.7)
Neutrophils Relative %: 82 %
Platelets: 266 10*3/uL (ref 150–400)
RBC: 4.4 MIL/uL (ref 3.87–5.11)
RDW: 16.3 % — ABNORMAL HIGH (ref 11.5–15.5)
WBC: 6 10*3/uL (ref 4.0–10.5)
nRBC: 0 % (ref 0.0–0.2)

## 2020-03-17 LAB — CBG MONITORING, ED: Glucose-Capillary: 85 mg/dL (ref 70–99)

## 2020-03-17 LAB — COMPREHENSIVE METABOLIC PANEL
ALT: 7 U/L (ref 0–44)
AST: 10 U/L — ABNORMAL LOW (ref 15–41)
Albumin: 2.6 g/dL — ABNORMAL LOW (ref 3.5–5.0)
Alkaline Phosphatase: 106 U/L (ref 38–126)
Anion gap: 16 — ABNORMAL HIGH (ref 5–15)
BUN: 35 mg/dL — ABNORMAL HIGH (ref 8–23)
CO2: 25 mmol/L (ref 22–32)
Calcium: 9.3 mg/dL (ref 8.9–10.3)
Chloride: 100 mmol/L (ref 98–111)
Creatinine, Ser: 1.82 mg/dL — ABNORMAL HIGH (ref 0.44–1.00)
GFR, Estimated: 30 mL/min — ABNORMAL LOW (ref >60)
Glucose, Bld: 103 mg/dL — ABNORMAL HIGH (ref 70–99)
Potassium: 3.2 mmol/L — ABNORMAL LOW (ref 3.5–5.1)
Sodium: 141 mmol/L (ref 135–145)
Total Bilirubin: 0.8 mg/dL (ref 0.3–1.2)
Total Protein: 5.7 g/dL — ABNORMAL LOW (ref 6.5–8.1)

## 2020-03-17 LAB — RAPID URINE DRUG SCREEN, HOSP PERFORMED
Amphetamines: NOT DETECTED
Barbiturates: NOT DETECTED
Benzodiazepines: NOT DETECTED
Cocaine: NOT DETECTED
Opiates: NOT DETECTED
Tetrahydrocannabinol: NOT DETECTED

## 2020-03-17 LAB — BRAIN NATRIURETIC PEPTIDE: B Natriuretic Peptide: 3886.5 pg/mL — ABNORMAL HIGH (ref 0.0–100.0)

## 2020-03-17 LAB — LIPASE, BLOOD: Lipase: 15 U/L (ref 11–51)

## 2020-03-17 LAB — TROPONIN I (HIGH SENSITIVITY)
Troponin I (High Sensitivity): 20 ng/L — ABNORMAL HIGH (ref <18)
Troponin I (High Sensitivity): 20 ng/L — ABNORMAL HIGH (ref <18)

## 2020-03-17 LAB — MAGNESIUM: Magnesium: 1.8 mg/dL (ref 1.7–2.4)

## 2020-03-17 MED ORDER — FENTANYL CITRATE (PF) 100 MCG/2ML IJ SOLN
25.0000 ug | Freq: Once | INTRAMUSCULAR | Status: AC
  Administered 2020-03-17: 10:00:00 25 ug via INTRAVENOUS
  Filled 2020-03-17: qty 2

## 2020-03-17 MED ORDER — KETOROLAC TROMETHAMINE 30 MG/ML IJ SOLN
30.0000 mg | Freq: Once | INTRAMUSCULAR | Status: AC
  Administered 2020-03-17: 13:00:00 30 mg via INTRAVENOUS
  Filled 2020-03-17: qty 1

## 2020-03-17 MED ORDER — FUROSEMIDE 10 MG/ML IJ SOLN
40.0000 mg | Freq: Once | INTRAMUSCULAR | Status: AC
  Administered 2020-03-17: 23:00:00 40 mg via INTRAVENOUS
  Filled 2020-03-17: qty 4

## 2020-03-17 MED ORDER — LORAZEPAM 0.5 MG PO TABS: 0.5000 mg | ORAL_TABLET | Freq: Four times a day (QID) | ORAL | Status: DC | PRN

## 2020-03-17 MED ORDER — POTASSIUM CHLORIDE CRYS ER 20 MEQ PO TBCR
20.0000 meq | EXTENDED_RELEASE_TABLET | Freq: Once | ORAL | Status: AC
  Administered 2020-03-17: 23:00:00 20 meq via ORAL
  Filled 2020-03-17: qty 1

## 2020-03-17 MED ORDER — ACETAMINOPHEN 325 MG PO TABS: 650.0000 mg | ORAL_TABLET | Freq: Four times a day (QID) | ORAL | Status: DC | PRN

## 2020-03-17 MED ORDER — HYDRALAZINE HCL 25 MG PO TABS
25.0000 mg | ORAL_TABLET | Freq: Three times a day (TID) | ORAL | Status: DC
  Filled 2020-03-17: qty 1

## 2020-03-17 MED ORDER — FENTANYL CITRATE (PF) 100 MCG/2ML IJ SOLN
50.0000 ug | INTRAMUSCULAR | Status: DC | PRN
  Administered 2020-03-17: 50 ug via INTRAVENOUS
  Filled 2020-03-17: qty 2

## 2020-03-17 MED ORDER — FUROSEMIDE 10 MG/ML IJ SOLN
40.0000 mg | Freq: Once | INTRAMUSCULAR | Status: AC
  Administered 2020-03-17: 12:00:00 40 mg via INTRAVENOUS
  Filled 2020-03-17: qty 4

## 2020-03-17 MED ORDER — SODIUM CHLORIDE 0.9 % IV SOLN
500.0000 mg | Freq: Every day | INTRAVENOUS | Status: DC
  Administered 2020-03-18: 02:00:00 500 mg via INTRAVENOUS
  Filled 2020-03-17: qty 500

## 2020-03-17 MED ORDER — ONDANSETRON HCL 4 MG/2ML IJ SOLN
4.0000 mg | Freq: Once | INTRAMUSCULAR | Status: AC
  Administered 2020-03-17: 13:00:00 4 mg via INTRAVENOUS
  Filled 2020-03-17: qty 2

## 2020-03-17 MED ORDER — SENNOSIDES-DOCUSATE SODIUM 8.6-50 MG PO TABS: 1.0000 | ORAL_TABLET | Freq: Every evening | ORAL | Status: DC | PRN

## 2020-03-17 MED ORDER — MAGNESIUM SULFATE IN D5W 1-5 GM/100ML-% IV SOLN
1.0000 g | Freq: Once | INTRAVENOUS | Status: AC
  Administered 2020-03-17: 23:00:00 1 g via INTRAVENOUS
  Filled 2020-03-17: qty 100

## 2020-03-17 MED ORDER — OXYCODONE HCL 5 MG PO TABS: 5.0000 mg | ORAL_TABLET | Freq: Four times a day (QID) | ORAL | Status: DC | PRN

## 2020-03-17 MED ORDER — HEPARIN SODIUM (PORCINE) 5000 UNIT/ML IJ SOLN
5000.0000 [IU] | Freq: Three times a day (TID) | INTRAMUSCULAR | Status: DC
  Administered 2020-03-18 – 2020-03-20 (×9): 5000 [IU] via SUBCUTANEOUS
  Filled 2020-03-17 (×8): qty 1

## 2020-03-17 MED ORDER — FENTANYL CITRATE (PF) 100 MCG/2ML IJ SOLN
50.0000 ug | Freq: Once | INTRAMUSCULAR | Status: AC
  Administered 2020-03-17: 15:00:00 50 ug via INTRAVENOUS
  Filled 2020-03-17: qty 2

## 2020-03-17 MED ORDER — POTASSIUM CHLORIDE 10 MEQ/100ML IV SOLN
10.0000 meq | INTRAVENOUS | Status: AC
  Administered 2020-03-17 (×2): 10 meq via INTRAVENOUS
  Filled 2020-03-17 (×2): qty 100

## 2020-03-17 MED ORDER — IOHEXOL 350 MG/ML SOLN
50.0000 mL | Freq: Once | INTRAVENOUS | Status: AC | PRN
  Administered 2020-03-17: 50 mL via INTRAVENOUS

## 2020-03-17 MED ORDER — SODIUM CHLORIDE 0.9 % IV SOLN
2.0000 g | Freq: Every day | INTRAVENOUS | Status: DC
  Administered 2020-03-18: 02:00:00 2 g via INTRAVENOUS
  Filled 2020-03-17: qty 20

## 2020-03-17 MED ORDER — DIPHENHYDRAMINE HCL 50 MG/ML IJ SOLN
25.0000 mg | Freq: Once | INTRAMUSCULAR | Status: AC
  Administered 2020-03-17: 13:00:00 25 mg via INTRAVENOUS
  Filled 2020-03-17: qty 1

## 2020-03-17 MED ORDER — FUROSEMIDE 10 MG/ML IJ SOLN
60.0000 mg | Freq: Two times a day (BID) | INTRAMUSCULAR | Status: DC
  Administered 2020-03-18: 09:00:00 60 mg via INTRAVENOUS
  Filled 2020-03-17: qty 6

## 2020-03-17 MED ORDER — ONDANSETRON HCL 4 MG/2ML IJ SOLN
4.0000 mg | Freq: Four times a day (QID) | INTRAMUSCULAR | Status: DC | PRN
  Administered 2020-03-28: 07:00:00 4 mg via INTRAVENOUS
  Filled 2020-03-17: qty 2

## 2020-03-17 MED ORDER — GABAPENTIN 100 MG PO CAPS
200.0000 mg | ORAL_CAPSULE | Freq: Two times a day (BID) | ORAL | Status: DC
Start: 2020-03-17 — End: 2020-03-21
  Administered 2020-03-17 – 2020-03-21 (×6): 200 mg via ORAL
  Filled 2020-03-17 (×7): qty 2

## 2020-03-17 NOTE — ED Triage Notes (Signed)
Pt presents w/ c/o Increasing BLE weakenss, HA and multiple recent falls. Per EMS, pt poorly compliant with home medication to manage chronic CHF.

## 2020-03-17 NOTE — ED Provider Notes (Signed)
[facility] Provider Note   CSN: [id number] Arrival date & time: 03/07/2020  0848     History Chief Complaint  Patient presents with  . Headache  . Fall    Kara Hanson is a 64 y.o. female who presents to the emergency department via EMS after a severe headache and subsequently having 2 falls at home this morning.  She states that her grandson and granddaughter live with her, they were able to help her get up after her first fall, and they called EMS at that time, as they were unable to bring her to the emergency department.  They subsequently left the home and she fell again, and was unable to get herself up; EMS found her on the floor of her home.  She denies head trauma, LOC, nausea, vomiting since that time.  Denies blurry vision, double vision.  She does endorse sensation of confusion when she woke up this morning, states it improved after her second fall this morning.  She does endorse some right upper quadrant pain.  Denies dysuria, hematuria, urinary urgency.  She endorses urinary frequency, however she is on torsemide.  Patient states she has been feeling normal for her the last few days, went to bed around 11:30 PM last night, feeling normal.  She states she woke up today around 10:30 AM with her headache, however the time of this interview the 9:15 in the morning.  Patient appeared extremely surprised when I informed her of the current time.  She states "well then and I have no idea what time I woke up".  She endorses feeling weak after her second fall, however states "I am only weak in my knees".  Patient is not on any blood thinning medication. I have personally reviewed this patient's medical records.  She is history of type 2 diabetes, hypertension, hyperlipidemia, CHF, spinal stenosis, NSTEMI, CKD stage IV, COVID-19 pneumonia.   Patient was admitted in 11/2019 for acute on chronic respiratory failure secondary to CHF acute  diverticulitis at that time.  She was discharged home with antibiotic therapies, as well as on hospice care.  Patient states that she still is under the care of hospice providers.  Patient states she is on 9 L supplemental oxygen by nasal cannula at home; she is on 4 L by nasal cannula while in the emergency department, maintain saturations >94%.   Collateral history obtained from patient's daughter Kara Hanson a telephone, who states that the patient was functional, independent, and took care of herself prior to her admission in August 2021.  She states since that since that time her mother has been resistant to taking medications, and does not take care of herself anymore, states she is primarily bedridden since her admission.  The patient lives with her daughter and 2 grandchildren.  Kara Hanson states that her mother receives hospice care once per week, and that EMTs, few times a week to administer medications, however she frequently refuses them.  Patient's daughter expressed clear wish for patient to be admitted to the hospital for management of her CHF, and requested that we attempt to convince her is important to take medications every day.  She did also confirm the patient expressed clear wishes to be DNR/DNI prior to her admission in August.  HPI     Past Medical History:  Diagnosis Date  . Acute CHF (congestive heart failure) ([place]) 09/24/2018  . Acute on chronic combined systolic and diastolic CHF (congestive heart failure) ([place]) 11/27/2017  . Acute  on chronic respiratory failure with hypoxia ([place]) 03/20/2018  . Acute renal failure superimposed on stage 3 chronic kidney disease ([place])   . Acute respiratory distress 11/27/2017  . Acute respiratory failure with hypoxia ([place]) 02/09/2019  . Acute systolic CHF (congestive heart failure) ([place]) 05/19/2019  . AKI (acute kidney injury) ([place]) 11/27/2017  . Atelectasis   . Bulging lumbar disc 11/14/2017  . CAP (community acquired pneumonia) 10/31/2018  . Chest  pain 06/17/2016  . CHF (congestive heart failure) ([place])   . Chronic diastolic CHF (congestive heart failure) ([place]) 07/20/2018  . Chronic respiratory failure ([place])   . CKD (chronic kidney disease), stage III 03/20/2018  . Controlled type 2 diabetes mellitus with hyperglycemia ([place]) 04/23/2016  . Controlled type 2 diabetes with neuropathy ([place]) 03/20/2018  . COVID-19 virus infection 07/20/2018  . CVA (cerebral vascular accident) ([place])   . Diabetes mellitus without complication ([place])   . Dyspnea   . Elevated troponin 04/18/2019  . Epigastric pain   . Gout 11/14/2017  . Hypercholesteremia   . Hyperlipidemia LDL goal <70 09/30/2016  . Hypertension   . Myocardial infarction ([place])   . Normochromic normocytic anemia 04/23/2016  . NSTEMI (non-ST elevated myocardial infarction) ([place]) 02/09/2019  . Pneumonia 11/01/2018  . Pneumonia due to COVID-19 virus 07/20/2018  . S/P thoracentesis   . SIRS (systemic inflammatory response syndrome) ([place]) 07/25/2018  . Spinal stenosis   . Stress-induced cardiomyopathy 05/19/2016  . Syncope 04/23/2016  . Type 2 diabetes mellitus with diabetic neuropathy, unspecified ([place]) 05/06/2016  . Vertigo 05/06/2016  . Vitamin D deficiency 04/02/2018    Patient Active Problem List   Diagnosis Date Noted  . DNR (do not resuscitate)   . DNI (do not intubate)   . Advanced directives, counseling/discussion   . Shortness of breath   . Abdominal pain 11/19/2019  . CKD (chronic kidney disease), stage IV ([place]) 11/19/2019  . Palliative care by specialist   . Goals of care, counseling/discussion   . Cardiorenal syndrome   . Hyponatremia 09/26/2019  . CHF exacerbation ([place]) 09/26/2019  . Acute on chronic systolic CHF (congestive heart failure) ([place]) 05/19/2019  . Elevated troponin 04/18/2019  . NSTEMI (non-ST elevated myocardial infarction) ([place]) 02/09/2019  . S/P thoracentesis   . Atelectasis   . Chronic respiratory failure ([place])   . CAP (community acquired pneumonia) 10/31/2018   . COVID-19 virus infection 07/20/2018  . Pneumonia due to COVID-19 virus 07/20/2018  . Vitamin D deficiency 04/02/2018  . Spinal stenosis 03/29/2018  . Acute on chronic respiratory failure with hypoxia ([place]) 03/20/2018  . Controlled type 2 diabetes with neuropathy ([place]) 03/20/2018  . Acute on chronic combined systolic and diastolic CHF (congestive heart failure) ([place]) 11/27/2017  . AKI (acute kidney injury) ([place]) 11/27/2017  . Acute respiratory distress 11/27/2017  . Bulging lumbar disc 11/14/2017  . Gout 11/14/2017  . Hyperlipidemia LDL goal <70 09/30/2016  . Chest pain 06/17/2016  . Stress-induced cardiomyopathy 05/19/2016  . Type 2 diabetes mellitus with diabetic neuropathy, unspecified ([place]) 05/06/2016  . Vertigo 05/06/2016  . Controlled type 2 diabetes mellitus with hyperglycemia ([place]) 04/23/2016  . Hypertension 04/23/2016  . Normochromic normocytic anemia 04/23/2016  . Syncope 04/23/2016    Past Surgical History:  Procedure Laterality Date  . BIOPSY  01/27/2019   Procedure: BIOPSY;  Surgeon: Otis Brace, MD;  Location: [facility];  Service: Gastroenterology;;  . BLADDER SURGERY    . CARDIAC CATHETERIZATION N/A 04/25/2016   Procedure: Left Heart Cath and Coronary Angiography;  Surgeon:  Lorretta Harp, MD;  Location: [facility];  Service: Cardiovascular;  Laterality: N/A;  . CARDIAC CATHETERIZATION  02/11/2019  . CESAREAN SECTION    . CHOLECYSTECTOMY    . ESOPHAGOGASTRODUODENOSCOPY (EGD) WITH PROPOFOL N/A 01/27/2019   Procedure: ESOPHAGOGASTRODUODENOSCOPY (EGD) WITH PROPOFOL;  Surgeon: Otis Brace, MD;  Location: [facility];  Service: Gastroenterology;  Laterality: N/A;  . RIGHT HEART CATH N/A 09/30/2019   Procedure: RIGHT HEART CATH;  Surgeon: Jolaine Artist, MD;  Location: [facility];  Service: Cardiovascular;  Laterality: N/A;  . RIGHT/LEFT HEART CATH AND CORONARY ANGIOGRAPHY N/A 02/11/2019   Procedure: RIGHT/LEFT HEART CATH AND  CORONARY ANGIOGRAPHY;  Surgeon: Jolaine Artist, MD;  Location: [facility];  Service: Cardiovascular;  Laterality: N/A;  . VIDEO BRONCHOSCOPY N/A 02/01/2019   Procedure: VIDEO BRONCHOSCOPY WITHOUT FLUORO;  Surgeon: Candee Furbish, MD;  Location: [facility];  Service: Endoscopy;  Laterality: N/A;     OB History   No obstetric history on file.     Family History  Problem Relation Age of Onset  . Diabetes Mellitus II Father   . Stroke Father   . Healthy Mother        She is 19 years old.     Social History   Tobacco Use  . Smoking status: Never Smoker  . Smokeless tobacco: Never Used  Vaping Use  . Vaping Use: Never used  Substance Use Topics  . Alcohol use: No  . Drug use: No    Home Medications Prior to Admission medications   Medication Sig Start Date End Date Taking? Authorizing Provider  DULoxetine (CYMBALTA) 30 MG capsule Take 1 capsule (30 mg total) by mouth daily. 12/16/19  Yes [name], MD  fluticasone (FLONASE) 50 MCG/ACT nasal spray Place 2 sprays into both nostrils daily as needed for allergies or rhinitis.   Yes [provider]  gabapentin (NEURONTIN) 100 MG capsule Take 2 capsules (200 mg total) by mouth 2 (two) times daily. 02/19/20  Yes Newlin, Charlane Ferretti, MD  LORazepam (ATIVAN) 0.5 MG tablet Take 0.5 mg by mouth as needed for anxiety.   Yes [provider]  torsemide (DEMADEX) 20 MG tablet Take 2 tablets (40 mg total) by mouth 2 (two) times daily. 02/20/20  Yes Bensimhon, Shaune Pascal, MD  allopurinol (ZYLOPRIM) 100 MG tablet Take 2 tablets (200 mg total) by mouth at bedtime. Patient not taking: No sig reported 10/06/19   Georgette Shell, MD  benzonatate (TESSALON) 100 MG capsule Take 1 capsule (100 mg total) by mouth 2 (two) times daily as needed for cough. Patient not taking: No sig reported 12/25/19   [name], MD  hydrALAZINE (APRESOLINE) 25 MG tablet Take 1 tablet (25 mg total) by mouth 3 (three) times daily.  12/02/19 01/01/20  Dessa Phi, DO  hydrOXYzine (ATARAX/VISTARIL) 25 MG tablet Take 1 tablet (25 mg total) by mouth 2 (two) times daily as needed for itching. Patient not taking: No sig reported 12/16/19   [name], MD  lansoprazole (PREVACID) 15 MG capsule TAKE 1 CAPSULE (15 MG TOTAL) BY MOUTH DAILY. Patient not taking: No sig reported 09/05/19   [name], MD  LORazepam (ATIVAN) 0.5 MG tablet Take 1 tablet (0.5 mg total) by mouth every 4 (four) hours as needed for anxiety. May crush, mix with water and give sublingually if needed. Patient not taking: Reported on 03/10/2020 12/02/19   Dessa Phi, DO  Misc. Devices MISC Portable oxygen concentrator.  Diagnosis-chronic respiratory failure.  09/03/18   [name], MD  Misc. Devices MISC [place] with seat. Dx: Congestive Heart Failure 10/19/18   [name], MD  Misc. Devices MISC Scale; Dx - CHF 06/10/19   [name], MD  Misc. [facility] bed.  Diagnosis CHF.  Lifetime use.  Weight 165 lbs. 07/10/19   [name], MD  Misc. Devices [place] chair DX CHF 10/10/19   [name], MD  Misc. Devices MISC Manual wheelchai with cushion, anti - tippers, brakes and lock.  Diagnosis congestive heart failure.  Weight 140 pounds. 10/14/19   [name], MD  morphine (ROXANOL) 20 MG/ML concentrated solution Take 0.25 mLs (5 mg total) by mouth every 4 (four) hours as needed for shortness of breath. Patient not taking: No sig reported 12/02/19   Dessa Phi, DO  polyethylene glycol (MIRALAX / GLYCOLAX) 17 g packet Take 17 g by mouth daily as needed for moderate constipation. Patient not taking: No sig reported 12/16/19   [name], MD  potassium chloride SA (KLOR-CON) 20 MEQ tablet Take 1 tablet (20 mEq total) by mouth daily for 7 days. Patient not taking: No sig reported 11/26/19 01/08/20  Cristal Ford, DO  primidone (MYSOLINE) 50 MG tablet Take 1 tablet (50 mg total) by mouth in the morning and at  bedtime. Patient not taking: No sig reported 08/19/19   Tat, Eustace Quail, DO  promethazine (PHENERGAN) 25 MG tablet Take 1 tablet (25 mg total) by mouth every 8 (eight) hours as needed for nausea or vomiting. Patient not taking: No sig reported 12/06/19   [name], MD    Allergies    Garlic, Isosorbide, Latex, Morphine and related, and Other  Review of Systems   Review of Systems  Constitutional: Negative for activity change, appetite change, chills, diaphoresis, fatigue and fever.  HENT: Negative.   Eyes: Negative.   Respiratory: Positive for shortness of breath.   Cardiovascular: Positive for leg swelling. Negative for chest pain and palpitations.  Gastrointestinal: Positive for abdominal pain and diarrhea. Negative for blood in stool, constipation, nausea and vomiting.  Genitourinary: Negative for enuresis, frequency, hematuria and urgency.  Musculoskeletal: Positive for arthralgias.       Right hip and knee pain  Neurological: Positive for weakness, light-headedness and headaches. Negative for syncope.  Psychiatric/Behavioral: Positive for confusion.    Physical Exam Updated Vital Signs BP 128/77   Pulse 84   Temp 97.8 F (36.6 C) (Oral)   Resp 12   Ht 4\' 11"  (1.499 m)   SpO2 94%   BMI 30.70 kg/m   Physical Exam Vitals and nursing note reviewed.  HENT:     Head: Normocephalic and atraumatic.     Mouth/Throat:     Mouth: Mucous membranes are moist.     Pharynx: No oropharyngeal exudate or posterior oropharyngeal erythema.  Eyes:     General:        Right eye: No discharge.        Left eye: No discharge.     Conjunctiva/sclera: Conjunctivae normal.     Pupils:     Left eye: Pupil is round, reactive and not sluggish.     Visual Fields: Right eye visual fields normal and left eye visual fields normal.     Comments: Left pupil round and reactive to light, right pupil irregular in shape and minimally responsive to light (patient states this changes have been  existing since her childhood, she has chronic poor vision in that eye)  Cardiovascular:  Rate and Rhythm: Normal rate and regular rhythm.     Pulses: Normal pulses.     Heart sounds: No murmur heard.   Pulmonary:     Effort: Pulmonary effort is normal. No respiratory distress.     Breath sounds: Normal breath sounds. No wheezing or rales.  Abdominal:     General: Bowel sounds are normal. There is no distension.     Tenderness: There is no abdominal tenderness.  Musculoskeletal:        General: Swelling and tenderness present. No deformity.     Cervical back: Neck supple. No signs of trauma, rigidity, spasms, tenderness or bony tenderness. Normal range of motion.     Thoracic back: No spasms, tenderness or bony tenderness.     Lumbar back: No spasms, tenderness or bony tenderness.     Right hip: Bony tenderness present. No tenderness or crepitus.     Left hip: Normal.     Right upper leg: Normal.     Left upper leg: Normal.     Right knee: Bony tenderness present. No deformity, ecchymosis, lacerations or crepitus.     Left knee: Normal.     Right lower leg: No tenderness or bony tenderness. 2+ Pitting Edema present.     Left lower leg: No deformity, tenderness or bony tenderness. 2+ Pitting Edema present.     Right ankle: Swelling present. No tenderness.     Left ankle: Swelling present. No tenderness.     Left Achilles Tendon: Normal.     Right foot: Normal capillary refill. Swelling present. Normal pulse.     Left foot: Normal capillary refill. Swelling present. Normal pulse.  Lymphadenopathy:     Cervical: No cervical adenopathy.  Skin:    General: Skin is warm and dry.     Capillary Refill: Capillary refill takes less than 2 seconds.  Neurological:     Mental Status: She is alert. Mental status is at baseline.     Cranial Nerves: Cranial nerves are intact.     Sensory: Sensation is intact.     Motor: Motor function is intact.     Coordination: Coordination is intact.      Comments: Patient delayed in her cognition, has difficulty finding words for what she wants to say.   Patient with 4/5 grip strength bilaterally,  Patient able to each leg off of the bed, however will not hold it there, and is only able to lift it just barely away from the bed. 4/5 plantar and dorsiflexion bilaterally.   Psychiatric:        Mood and Affect: Mood normal.     ED Results / Procedures / Treatments   Labs (all labs ordered are listed, but only abnormal results are displayed) Labs Reviewed  CBC WITH DIFFERENTIAL/PLATELET - Abnormal; Notable for the following components:      Result Value   Hemoglobin 11.7 (*)    MCHC 29.5 (*)    RDW 16.3 (*)    Lymphs Abs 0.5 (*)    All other components within normal limits  BRAIN NATRIURETIC PEPTIDE - Abnormal; Notable for the following components:   B Natriuretic Peptide 3,886.5 (*)    All other components within normal limits  COMPREHENSIVE METABOLIC PANEL - Abnormal; Notable for the following components:   Potassium 3.2 (*)    Glucose, Bld 103 (*)    BUN 35 (*)    Creatinine, Ser 1.82 (*)    Total Protein 5.7 (*)    Albumin 2.6 (*)  AST 10 (*)    GFR, Estimated 30 (*)    Anion gap 16 (*)    All other components within normal limits  URINALYSIS, ROUTINE W REFLEX MICROSCOPIC - Abnormal; Notable for the following components:   Glucose, UA 50 (*)    Hgb urine dipstick SMALL (*)    Protein, ur >=300 (*)    All other components within normal limits  TROPONIN I (HIGH SENSITIVITY) - Abnormal; Notable for the following components:   Troponin I (High Sensitivity) 20 (*)    All other components within normal limits  TROPONIN I (HIGH SENSITIVITY) - Abnormal; Notable for the following components:   Troponin I (High Sensitivity) 20 (*)    All other components within normal limits  RESP PANEL BY RT-PCR (FLU A&B, COVID) ARPGX2  URINE CULTURE  LIPASE, BLOOD  RAPID URINE DRUG SCREEN, HOSP PERFORMED  MAGNESIUM  CBG MONITORING, ED     EKG EKG Interpretation  Date/Time:  Tuesday March 17 2020 08:56:07 EST Ventricular Rate:  88 PR Interval:    QRS Duration: 94 QT Interval:  384 QTC Calculation: 465 R Axis:   50 Text Interpretation: Sinus rhythm Atrial premature complexes Probable LVH with secondary repol abnrm Artifact in lead(s) I II aVR NSR, T wave inversions in inferior lateral leads unchanged Confirmed by Lavenia Atlas [phone]) on 03/27/2020 9:00:11 AM   Radiology CT Angio Head W/Cm &/Or Wo Cm  Result Date: 03/08/2020 CLINICAL DATA:  Intractable headache. EXAM: CT ANGIOGRAPHY HEAD AND NECK TECHNIQUE: Multidetector CT imaging of the head and neck was performed using the standard protocol during bolus administration of intravenous contrast. Multiplanar CT image reconstructions and MIPs were obtained to evaluate the vascular anatomy. Carotid stenosis measurements (when applicable) are obtained utilizing NASCET criteria, using the distal internal carotid diameter as the denominator. CONTRAST:  70mL OMNIPAQUE IOHEXOL 350 MG/ML SOLN COMPARISON:  None. FINDINGS: CT HEAD FINDINGS Brain: No evidence of acute infarction, hemorrhage, hydrocephalus, extra-axial collection or mass lesion/mass effect. Vascular: No hyperdense vessel. Prominent calcified plaques in the bilateral carotid siphons. Skull: Normal. Negative for fracture or focal lesion. Sinuses: Bilateral mastoid effusion. Orbits: Right lens surgery.  No acute findings. Review of the MIP images confirms the above findings CTA NECK FINDINGS Aortic arch: Common origin of the innominate and left common carotid artery from the aortic arch. Imaged portion shows no evidence of aneurysm or dissection. Calcified plaques are noted in the aortic arch and at the origin of the left subclavian artery without significant stenosis. Right carotid system: Calcified atherosclerotic plaque in the right carotid bifurcation without hemodynamically significant stenosis. There is increased  tortuosity of the cervical segment of the right ICA. Left carotid system: Mild atherosclerotic plaques in the left common carotid artery and left carotid bifurcation without hemodynamically significant stenosis. Vertebral arteries: Codominant. No evidence of dissection, stenosis (50% or greater) or occlusion in the neck. Skeleton: Degenerative changes of the cervical spine. No aggressive bone lesion or acute finding. Other neck: Mildly heterogeneous thyroid gland with a 6 mm partially calcified nodule in the left thyroid lobe. Upper chest: Bilateral ground-glass opacities, right greater than left. Multiple lymph nodes in the superior mediastinum with a precarinal lymph node measuring 11 mm. Bilateral pleural effusion, right greater than left. Review of the MIP images confirms the above findings CTA HEAD FINDINGS Anterior circulation: Calcified plaques in the bilateral carotid siphons with mild stenosis at the paraclinoid segment bilaterally. Hypoplastic right A1/ACA with dominant left A1/ACA segment. Two short segments of occlusion within and M4  segment of the left MCA posterior division branch (series 12, image 15). The bilateral ACA and MCA vascular trees are otherwise maintained. Posterior circulation: Partially calcified atherosclerotic plaques in the intracranial right vertebral artery resulting in severe stenosis. The intracranial left vertebral artery is maintained. The basilar artery and posterior cerebral arteries have normal course and caliber. Venous sinuses: As permitted by contrast timing, patent. Anatomic variants: Hypoplastic right A1/ACA. Review of the MIP images confirms the above findings IMPRESSION: 1. No acute intracranial abnormality. 2. Two short segments of occlusion within and M4 segment of the left MCA posterior division branch. This may be secondary to intracranial atherosclerotic disease versus embolic. In case of high clinical suspicion for acute infarct, an MRI of the brain suggested. 3.  Severe stenosis of the intracranial right vertebral artery. 4. No hemodynamically significant stenosis in the neck. 5. Bilateral pleural effusion, right greater than left. 6. Bilateral ground-glass opacity of the visualized lungs, mostly on the right side. 7. Aortic atherosclerosis. These results were called by telephone at the time of interpretation on 04/01/2020 at 3:31 pm to provider [facility] , who verbally acknowledged these results. Aortic Atherosclerosis (ICD10-I70.0). Electronically Signed   By: Pedro Earls M.D.   On: 03/13/2020 15:34   DG Chest 1 View  Result Date: 03/31/2020 CLINICAL DATA:  Pain following fall EXAM: CHEST  1 VIEW COMPARISON:  November 27, 2019 FINDINGS: There is airspace consolidation in the left base. There are small pleural effusions bilaterally. There is cardiomegaly with pulmonary vascularity normal. No adenopathy. No pneumothorax. There is aortic atherosclerosis. No bone lesions. IMPRESSION: Airspace opacity left lower lobe concerning for potential pneumonia. Small pleural effusions bilaterally. Cardiomegaly. Note that there may be a degree of underlying congestive heart failure. Electronically Signed   By: Lowella Grip III M.D.   On: 03/18/2020 10:26   CT Head Wo Contrast  Result Date: 03/22/2020 CLINICAL DATA:  Pain following fall.  Reported delirium EXAM: CT HEAD WITHOUT CONTRAST TECHNIQUE: Contiguous axial images were obtained from the base of the skull through the vertex without intravenous contrast. COMPARISON:  January 28, 2019 FINDINGS: Brain: There is mild diffuse atrophy. There is no intracranial mass, hemorrhage, extra-axial fluid collection, or midline shift. Brain parenchyma appears unremarkable. No appreciable acute infarct. Vascular: No hyperdense vessel. There is calcification in each distal vertebral artery and carotid siphon region. Skull: Bony calvarium appears intact. Sinuses/Orbits: There is mucosal thickening in several  ethmoid air cells. Other visualized paranasal sinuses are clear. Status post cataract removal on the right. Orbits otherwise appear symmetric bilaterally. Other: Opacification of multiple mastoid air cells noted. IMPRESSION: Atrophy with periventricular small vessel disease, stable. No mass or hemorrhage. Multiple foci of arterial vascular calcification. Opacification of multiple mastoid air cells noted. Mucosal thickening in several ethmoid air cells. Electronically Signed   By: Lowella Grip III M.D.   On: 03/22/2020 10:33   CT Angio Neck W and/or Wo Contrast  Result Date: 03/21/2020 CLINICAL DATA:  Intractable headache. EXAM: CT ANGIOGRAPHY HEAD AND NECK TECHNIQUE: Multidetector CT imaging of the head and neck was performed using the standard protocol during bolus administration of intravenous contrast. Multiplanar CT image reconstructions and MIPs were obtained to evaluate the vascular anatomy. Carotid stenosis measurements (when applicable) are obtained utilizing NASCET criteria, using the distal internal carotid diameter as the denominator. CONTRAST:  18mL OMNIPAQUE IOHEXOL 350 MG/ML SOLN COMPARISON:  None. FINDINGS: CT HEAD FINDINGS Brain: No evidence of acute infarction, hemorrhage, hydrocephalus, extra-axial collection or mass lesion/mass effect.  Vascular: No hyperdense vessel. Prominent calcified plaques in the bilateral carotid siphons. Skull: Normal. Negative for fracture or focal lesion. Sinuses: Bilateral mastoid effusion. Orbits: Right lens surgery.  No acute findings. Review of the MIP images confirms the above findings CTA NECK FINDINGS Aortic arch: Common origin of the innominate and left common carotid artery from the aortic arch. Imaged portion shows no evidence of aneurysm or dissection. Calcified plaques are noted in the aortic arch and at the origin of the left subclavian artery without significant stenosis. Right carotid system: Calcified atherosclerotic plaque in the right carotid  bifurcation without hemodynamically significant stenosis. There is increased tortuosity of the cervical segment of the right ICA. Left carotid system: Mild atherosclerotic plaques in the left common carotid artery and left carotid bifurcation without hemodynamically significant stenosis. Vertebral arteries: Codominant. No evidence of dissection, stenosis (50% or greater) or occlusion in the neck. Skeleton: Degenerative changes of the cervical spine. No aggressive bone lesion or acute finding. Other neck: Mildly heterogeneous thyroid gland with a 6 mm partially calcified nodule in the left thyroid lobe. Upper chest: Bilateral ground-glass opacities, right greater than left. Multiple lymph nodes in the superior mediastinum with a precarinal lymph node measuring 11 mm. Bilateral pleural effusion, right greater than left. Review of the MIP images confirms the above findings CTA HEAD FINDINGS Anterior circulation: Calcified plaques in the bilateral carotid siphons with mild stenosis at the paraclinoid segment bilaterally. Hypoplastic right A1/ACA with dominant left A1/ACA segment. Two short segments of occlusion within and M4 segment of the left MCA posterior division branch (series 12, image 15). The bilateral ACA and MCA vascular trees are otherwise maintained. Posterior circulation: Partially calcified atherosclerotic plaques in the intracranial right vertebral artery resulting in severe stenosis. The intracranial left vertebral artery is maintained. The basilar artery and posterior cerebral arteries have normal course and caliber. Venous sinuses: As permitted by contrast timing, patent. Anatomic variants: Hypoplastic right A1/ACA. Review of the MIP images confirms the above findings IMPRESSION: 1. No acute intracranial abnormality. 2. Two short segments of occlusion within and M4 segment of the left MCA posterior division branch. This may be secondary to intracranial atherosclerotic disease versus embolic. In case of  high clinical suspicion for acute infarct, an MRI of the brain suggested. 3. Severe stenosis of the intracranial right vertebral artery. 4. No hemodynamically significant stenosis in the neck. 5. Bilateral pleural effusion, right greater than left. 6. Bilateral ground-glass opacity of the visualized lungs, mostly on the right side. 7. Aortic atherosclerosis. These results were called by telephone at the time of interpretation on 03/06/2020 at 3:31 pm to provider [facility] , who verbally acknowledged these results. Aortic Atherosclerosis (ICD10-I70.0). Electronically Signed   By: Pedro Earls M.D.   On: 03/06/2020 15:34   DG Knee Complete 4 Views Right  Result Date: 03/19/2020 CLINICAL DATA:  Pain following fall EXAM: RIGHT KNEE - COMPLETE 4+ VIEW COMPARISON:  None. FINDINGS: Frontal, lateral, and bilateral oblique views were obtained. There is no fracture, dislocation, or joint effusion. There is spurring in all compartments. There is mild narrowing medially. No erosion. IMPRESSION: Areas of osteoarthritic change. No appreciable fracture, dislocation, or joint effusion. Electronically Signed   By: Lowella Grip III M.D.   On: 04/01/2020 10:28   DG HIP UNILAT WITH PELVIS 2-3 VIEWS RIGHT  Result Date: 03/15/2020 CLINICAL DATA:  Pain following fall EXAM: DG HIP (WITH OR WITHOUT PELVIS) 2-3V RIGHT COMPARISON:  None. FINDINGS: Frontal pelvis as well as frontal and  lateral right hip images were obtained. There is no fracture or dislocation. There is moderate symmetric narrowing of each hip joint. No erosive change. IMPRESSION: No acute fracture or dislocation. Moderate symmetric narrowing each hip joint. Electronically Signed   By: Lowella Grip III M.D.   On: 04/01/2020 10:26    Procedures .Critical Care Performed by: Emeline Darling, PA-C Authorized by: Emeline Darling, PA-C   Critical care provider statement:    Critical care time (minutes):  45    Critical care was necessary to treat or prevent imminent or life-threatening deterioration of the following conditions:  CNS failure or compromise and cardiac failure   Critical care was time spent personally by me on the following activities:  Development of treatment plan with patient or surrogate, discussions with consultants, evaluation of patient's response to treatment, examination of patient, review of old charts, ordering and review of radiographic studies and ordering and review of laboratory studies   Care discussed with: admitting provider     (including critical care time)  Medications Ordered in ED Medications  fentaNYL (SUBLIMAZE) injection 25 mcg (25 mcg Intravenous Given 03/26/2020 0939)  furosemide (LASIX) injection 40 mg (40 mg Intravenous Given 03/26/2020 1219)  ketorolac (TORADOL) 30 MG/ML injection 30 mg (30 mg Intravenous Given 03/06/2020 1245)  diphenhydrAMINE (BENADRYL) injection 25 mg (25 mg Intravenous Given 03/10/2020 1245)  ondansetron (ZOFRAN) injection 4 mg (4 mg Intravenous Given 03/09/2020 1245)  fentaNYL (SUBLIMAZE) injection 50 mcg (50 mcg Intravenous Given 03/15/2020 1452)  potassium chloride 10 mEq in 100 mL IVPB (0 mEq Intravenous Stopped 03/16/2020 1631)  iohexol (OMNIPAQUE) 350 MG/ML injection 50 mL (50 mLs Intravenous Contrast Given 03/05/2020 1504)    ED Course  I have reviewed the triage vital signs and the nursing notes.  Pertinent labs & imaging results that were available during my care of the patient were reviewed by me and considered in my medical decision making (see chart for details).  Clinical Course as of 03/05/2020 1850  Tue Mar 17, 2020  1146 B Natriuretic Peptide(!): 3,886.5 [RS]  1146 GFR, Estimated(!): 30 [RS]    Clinical Course User Index [RS] Akio Hudnall, Gypsy Balsam, PA-C   MDM Rules/Calculators/A&P                         64 year old female with history of CHF who presents via EMS for severe headache, confusion, and 2 falls at home this  morning.  Patient is not on anticoagulation.  Patient is on hospice care at home for CHF.  The differential diagnosis for AMS is extensive and includes, but is not limited to:  Marland Kitchen Drug overdose - opioids, alcohol, sedatives, antipsychotics, drug withdrawal, others . Metabolic: hypoxia, hypoglycemia, hyperglycemia, hypercalcemia, hypernatremia, hyponatremia, uremia, hepatic encephalopathy, hypothyroidism, hyperthyroidism, vitamin B12 or thiamine deficiency, carbon monoxide poisoning, Wilson's disease, Lactic acidosis . Infectious: meningitis, encephalitis, bacteremia/sepsis, urinary tract infection, pneumonia, neurosyphilis . Structural: Space-occupying lesion, (brain tumor, subdural hematoma, hydrocephalus,) . Vascular: stroke, subarachnoid hemorrhage, coronary ischemia, hypertensive encephalopathy, CNS vasculitis, thrombotic thrombocytopenic purpura, disseminated intravascular coagulation, hyperviscosity . Psychiatric: Schizophrenia, depression; Other: Seizure, hypothermia, heat stroke, ICU psychosis, dementia -"sundowning."    Patient was hypertensive on intake 141/86, was hypoxic and was placed on 4 L by nasal cannula.  (Patient on 9 L at home).  Patient became tachypneic while in the emergency department.  To my evaluation the patient appears to be confused, is a poor historian, disoriented.  She is complaining of headache.  Neurologic exam  is normal, cardiac exam with benign, pulmonary exam with rales in the lung bases, left > right.  Tenderness to palpation of the right knee and hip.  We will proceed with basic laboratory studies, CT of the head, EKG, chest x-ray, and plain films.  CBC at patient baseline mild anemia of 11.7.  CMP with mild hypokalemia 3.2, otherwise at patient's baseline.  UA with small amount of hemoglobin positive protein hyaline casts, but without sign of infection.  Lipase normal at 15.  Troponin 20, patient baseline.  BNP significantly elevated to 3886.5,  Previously 1337.9.  Analgesia offered without relief. Will proceed with migraine cocktail.  Extensive discussion took place with patient regarding goals of care.  Patient is alert and oriented at the time of this conversation.  She states that she felt like hospice was in place to help her with her medications at home and help manage her heart failure.  She is clear that she wants to be DNR/DNI, however she states that " I do not want to die anytime soon".  She appeared surprised when I explained that hospice is usually offered to patient with projected lifespan less than 6 months; further discussion regarding her headache, she expressed a desire for [place] to continue to evaluate her with further testing.  She expressed clear wishes to proceed with treatment if testing reveals etiology for her headache with clear treatment plan.  Patient's family also expressed wishes that she would continue to live, and that we would continue to pursue clear cause for today's symptoms.  Both patient and her daughter expressed wishes for her to be admitted to the hospital this time.  Consult place d to neurology Dr.Bhagat, who recommended adding a urine drug screen and for neurology PA to evaluate the patient in the emergency department, prior to making final recommendations. She agreed with proceeding with CT angiogram at this time.   CTA results called to this provider by radiologist, with concern for extensive atherosclerotic disease, more advanced than expected for the patient's age of 19. Additionally she reported multiple distal occlusions primarily left side. Official read: Two short segments of occlusion within M4 segment of the left MCA posterior branch duration, possible etiology of intracranial atherosclerotic disease versus embolic.  Given the high suspicion radiologist recommend MRI.   I again spoke with Dr. Curly Shores, neurologist, regarding this patient's disposition.  We agreed this patient will require  admission to the hospital, now for completion of work-up of possible stroke.  Per neurologist preference, will hold off on orders for MRI at this time, as Dr. Curly Shores is still determining whether or not she would prefer to MRI the entire spine, given abnormal LE reflexes on her exam.  Consult placed to admitting service hospitalist, Dr. Myna Hidalgo, who is agreeable to seeing this patient and admitting her to his service.  I appreciate his collaboration in the of care of this patient.  Lemma voiced understanding of her medical evaluation and treatment plan.  Her questions were answered to her expressed satisfaction. She is amenable to plan for admission at this time.   Final Clinical Impression(s) / ED Diagnoses Final diagnoses:  Fall    Rx / DC Orders ED Discharge Orders    None       Emeline Darling, PA-C 03/12/2020 1856    Lorelle Gibbs, DO 03/23/20 1901

## 2020-03-17 NOTE — ED Notes (Signed)
Bumped Humidified oxygen to 15 L

## 2020-03-17 NOTE — ED Notes (Signed)
Performed bladder scan. Pt with 345ml retained urine. This was relayed to [place], [place]

## 2020-03-17 NOTE — ED Notes (Signed)
md made aware of pt hr 120 sinus tach as well as pt inability to  Take po meds.

## 2020-03-17 NOTE — ED Notes (Signed)
Pt on 6 l [place] with sats at 100

## 2020-03-17 NOTE — ED Notes (Signed)
Provided patient with 8oz of orange juice for concern of developing hypoglycemia

## 2020-03-17 NOTE — H&P (Addendum)
History and Physical    Kara Hanson ERD:[id number] DOB: 1956/03/04 DOA: 04/03/2020  PCP: [name], MD   Patient coming from: Home   Chief Complaint: Headache, falls, increased SOB   HPI: Kara Hanson is a 64 y.o. female with medical history significant for chronic combined systolic and diastolic CHF, chronic 4 L/min supplemental oxygen requirement, chronic kidney disease stage IV, nonadherence with her medications, and anxiety, now presenting to the emergency department for evaluation of headaches, falls, and increased shortness of breath.  Per report of the patient's daughter, the patient was complaining of a headache yesterday, has continued to have a headache today, is normally bedbound and severely deconditioned per family report, but was trying to ambulate today and suffered 2 falls.  She was having some pain in the right hip and knee but has been able to bear weight since the falls.  She also reports some insidious worsening in her chronic dyspnea, has not noticed much change in her chronic cough, and denies any fevers, chills, or chest pain.  ED Course: Upon arrival to the ED, patient is found to be afebrile, saturating in the 60s, tachypneic, and with stable blood pressure.  EKG features a sinus rhythm with LVH and repolarization abnormality.  Chest x-ray notable for airspace opacity at the left base which could reflect pneumonia, as well as small bilateral pleural effusions.  Radiographs of the right hip and knee are negative for acute findings.  CT head demonstrates stable small vessel disease with no acute findings.  CTA head and neck reveals 2 short segments of occlusion within the left MCA.  Chemistry panel is notable for hypokalemia and stable CKD 4.  BNP is elevated 3897.  High-sensitivity troponin slightly elevated and unchanged more than 2 hours later.  Neurology was consulted by the ED physician and the patient was treated with Lasix, Benadryl, Toradol, fentanyl, Zofran,  and potassium.  Review of Systems:  All other systems reviewed and apart from HPI, are negative.  Past Medical History:  Diagnosis Date  . Acute CHF (congestive heart failure) ([place]) 09/24/2018  . Acute on chronic combined systolic and diastolic CHF (congestive heart failure) ([place]) 11/27/2017  . Acute on chronic respiratory failure with hypoxia ([place]) 03/20/2018  . Acute renal failure superimposed on stage 3 chronic kidney disease ([place])   . Acute respiratory distress 11/27/2017  . Acute respiratory failure with hypoxia ([place]) 02/09/2019  . Acute systolic CHF (congestive heart failure) ([place]) 05/19/2019  . AKI (acute kidney injury) ([place]) 11/27/2017  . Atelectasis   . Bulging lumbar disc 11/14/2017  . CAP (community acquired pneumonia) 10/31/2018  . Chest pain 06/17/2016  . CHF (congestive heart failure) ([place])   . Chronic diastolic CHF (congestive heart failure) ([place]) 07/20/2018  . Chronic respiratory failure ([place])   . CKD (chronic kidney disease), stage III ([place]) 03/20/2018  . Controlled type 2 diabetes mellitus with hyperglycemia ([place]) 04/23/2016  . Controlled type 2 diabetes with neuropathy ([place]) 03/20/2018  . COVID-19 virus infection 07/20/2018  . CVA (cerebral vascular accident) ([place])   . Diabetes mellitus without complication ([place])   . Dyspnea   . Elevated troponin 04/18/2019  . Epigastric pain   . Gout 11/14/2017  . Hypercholesteremia   . Hyperlipidemia LDL goal <70 09/30/2016  . Hypertension   . Myocardial infarction ([place])   . Normochromic normocytic anemia 04/23/2016  . NSTEMI (non-ST elevated myocardial infarction) ([place]) 02/09/2019  . Pneumonia 11/01/2018  . Pneumonia due to COVID-19 virus 07/20/2018  . S/P thoracentesis   .  SIRS (systemic inflammatory response syndrome) ([place]) 07/25/2018  . Spinal stenosis   . Stress-induced cardiomyopathy 05/19/2016  . Syncope 04/23/2016  . Type 2 diabetes mellitus with diabetic neuropathy, unspecified ([place]) 05/06/2016  . Vertigo 05/06/2016  . Vitamin D  deficiency 04/02/2018    Past Surgical History:  Procedure Laterality Date  . BIOPSY  01/27/2019   Procedure: BIOPSY;  Surgeon: Otis Brace, MD;  Location: [facility];  Service: Gastroenterology;;  . BLADDER SURGERY    . CARDIAC CATHETERIZATION N/A 04/25/2016   Procedure: Left Heart Cath and Coronary Angiography;  Surgeon: Lorretta Harp, MD;  Location: [facility];  Service: Cardiovascular;  Laterality: N/A;  . CARDIAC CATHETERIZATION  02/11/2019  . CESAREAN SECTION    . CHOLECYSTECTOMY    . ESOPHAGOGASTRODUODENOSCOPY (EGD) WITH PROPOFOL N/A 01/27/2019   Procedure: ESOPHAGOGASTRODUODENOSCOPY (EGD) WITH PROPOFOL;  Surgeon: Otis Brace, MD;  Location: [facility];  Service: Gastroenterology;  Laterality: N/A;  . RIGHT HEART CATH N/A 09/30/2019   Procedure: RIGHT HEART CATH;  Surgeon: Jolaine Artist, MD;  Location: [facility];  Service: Cardiovascular;  Laterality: N/A;  . RIGHT/LEFT HEART CATH AND CORONARY ANGIOGRAPHY N/A 02/11/2019   Procedure: RIGHT/LEFT HEART CATH AND CORONARY ANGIOGRAPHY;  Surgeon: Jolaine Artist, MD;  Location: [facility];  Service: Cardiovascular;  Laterality: N/A;  . VIDEO BRONCHOSCOPY N/A 02/01/2019   Procedure: VIDEO BRONCHOSCOPY WITHOUT FLUORO;  Surgeon: Candee Furbish, MD;  Location: [facility];  Service: Endoscopy;  Laterality: N/A;    Social History:   reports that she has never smoked. She has never used smokeless tobacco. She reports that she does not drink alcohol and does not use drugs.  Allergies  Allergen Reactions  . Garlic Shortness Of Breath, Itching, Swelling and Other (See Comments)    "Raw garlic" = Hand itching and swelling  . Isosorbide Other (See Comments)    Sneezing and runny nose  . Latex Itching  . Morphine And Related Itching and Other (See Comments)    Headache   . Other Itching and Other (See Comments)    Reaction to newspaper ink- Headaches, also    Family History  Problem  Relation Age of Onset  . Diabetes Mellitus II Father   . Stroke Father   . Healthy Mother        She is 75 years old.      Prior to Admission medications   Medication Sig Start Date End Date Taking? Authorizing Provider  DULoxetine (CYMBALTA) 30 MG capsule Take 1 capsule (30 mg total) by mouth daily. 12/16/19  Yes [name], MD  fluticasone (FLONASE) 50 MCG/ACT nasal spray Place 2 sprays into both nostrils daily as needed for allergies or rhinitis.   Yes [provider]  gabapentin (NEURONTIN) 100 MG capsule Take 2 capsules (200 mg total) by mouth 2 (two) times daily. 02/19/20  Yes Newlin, Charlane Ferretti, MD  LORazepam (ATIVAN) 0.5 MG tablet Take 0.5 mg by mouth as needed for anxiety.   Yes [provider]  torsemide (DEMADEX) 20 MG tablet Take 2 tablets (40 mg total) by mouth 2 (two) times daily. 02/20/20  Yes Bensimhon, Shaune Pascal, MD  allopurinol (ZYLOPRIM) 100 MG tablet Take 2 tablets (200 mg total) by mouth at bedtime. Patient not taking: No sig reported 10/06/19   Georgette Shell, MD  benzonatate (TESSALON) 100 MG capsule Take 1 capsule (100 mg total) by mouth 2 (two) times daily as needed for cough. Patient not taking: No sig reported  12/25/19   [name], MD  hydrALAZINE (APRESOLINE) 25 MG tablet Take 1 tablet (25 mg total) by mouth 3 (three) times daily. 12/02/19 01/01/20  Dessa Phi, DO  hydrOXYzine (ATARAX/VISTARIL) 25 MG tablet Take 1 tablet (25 mg total) by mouth 2 (two) times daily as needed for itching. Patient not taking: No sig reported 12/16/19   [name], MD  lansoprazole (PREVACID) 15 MG capsule TAKE 1 CAPSULE (15 MG TOTAL) BY MOUTH DAILY. Patient not taking: No sig reported 09/05/19   [name], MD  LORazepam (ATIVAN) 0.5 MG tablet Take 1 tablet (0.5 mg total) by mouth every 4 (four) hours as needed for anxiety. May crush, mix with water and give sublingually if needed. Patient not taking: Reported on 03/16/2020 12/02/19   Dessa Phi, DO  Misc. Devices MISC Portable oxygen concentrator.  Diagnosis-chronic respiratory failure. 09/03/18   [name], MD  Misc. Devices MISC [place] with seat. Dx: Congestive Heart Failure 10/19/18   [name], MD  Misc. Devices MISC Scale; Dx - CHF 06/10/19   [name], MD  Misc. [place] [facility] bed.  Diagnosis CHF.  Lifetime use.  Weight 165 lbs. 07/10/19   [name], MD  Misc. Devices [place] chair DX CHF 10/10/19   [name], MD  Misc. Devices MISC Manual wheelchai with cushion, anti - tippers, brakes and lock.  Diagnosis congestive heart failure.  Weight 140 pounds. 10/14/19   [name], MD  morphine (ROXANOL) 20 MG/ML concentrated solution Take 0.25 mLs (5 mg total) by mouth every 4 (four) hours as needed for shortness of breath. Patient not taking: No sig reported 12/02/19   Dessa Phi, DO  polyethylene glycol (MIRALAX / GLYCOLAX) 17 g packet Take 17 g by mouth daily as needed for moderate constipation. Patient not taking: No sig reported 12/16/19   [name], MD  potassium chloride SA (KLOR-CON) 20 MEQ tablet Take 1 tablet (20 mEq total) by mouth daily for 7 days. Patient not taking: No sig reported 11/26/19 01/08/20  Cristal Ford, DO  primidone (MYSOLINE) 50 MG tablet Take 1 tablet (50 mg total) by mouth in the morning and at bedtime. Patient not taking: No sig reported 08/19/19   Tat, Eustace Quail, DO  promethazine (PHENERGAN) 25 MG tablet Take 1 tablet (25 mg total) by mouth every 8 (eight) hours as needed for nausea or vomiting. Patient not taking: No sig reported 12/06/19   [name], MD    Physical Exam: Vitals:   03/07/2020 1945 03/24/2020 2000 03/21/2020 2012 03/21/2020 2100  BP: 125/85 131/87  111/63  Pulse: 90 87  88  Resp: (!) 22 18  13   Temp:      TempSrc:      SpO2: 93% 100% 97% 100%  Height:        Constitutional: NAD, calm  Eyes: PERTLA, lids and conjunctivae normal ENMT: Mucous membranes are moist. Posterior pharynx  clear of any exudate or lesions.   Neck: normal, supple, no masses, no thyromegaly Respiratory: Dyspnea with speech, mild tachypnea. No pallor or cyanosis.  Cardiovascular: S1 & S2 heard, regular rate and rhythm. Pitting edema to bilateral LEs.  Abdomen: No distension, no tenderness, soft. Bowel sounds active.  Musculoskeletal: no clubbing / cyanosis. No joint deformity upper and lower extremities.   Skin: no significant rashes, lesions, ulcers. Warm, dry, well-perfused. Neurologic: No gross facial asymmetry. Sensation intact. Moving all extremities.  Psychiatric: Alert and oriented to person, place, and situation. Pleasant and cooperative.    Labs  and Imaging on Admission: I have personally reviewed following labs and imaging studies  CBC: Recent Labs  Lab 03/06/2020 0926  WBC 6.0  NEUTROABS 4.9  HGB 11.7*  HCT 39.7  MCV 90.2  PLT 697   Basic Metabolic Panel: Recent Labs  Lab 03/27/2020 0926 03/10/2020 1458  NA 141  --   K 3.2*  --   CL 100  --   CO2 25  --   GLUCOSE 103*  --   BUN 35*  --   CREATININE 1.82*  --   CALCIUM 9.3  --   MG  --  1.8   GFR: CrCl cannot be calculated (Unknown ideal weight.). Liver Function Tests: Recent Labs  Lab 03/11/2020 0926  AST 10*  ALT 7  ALKPHOS 106  BILITOT 0.8  PROT 5.7*  ALBUMIN 2.6*   Recent Labs  Lab 03/21/2020 0926  LIPASE 15   No results for input(s): AMMONIA in the last 168 hours. Coagulation Profile: No results for input(s): INR, PROTIME in the last 168 hours. Cardiac Enzymes: No results for input(s): CKTOTAL, CKMB, CKMBINDEX, TROPONINI in the last 168 hours. BNP (last 3 results) No results for input(s): PROBNP in the last 8760 hours. HbA1C: No results for input(s): HGBA1C in the last 72 hours. CBG: Recent Labs  Lab 03/21/2020 1706  GLUCAP 85   Lipid Profile: No results for input(s): CHOL, HDL, LDLCALC, TRIG, CHOLHDL, LDLDIRECT in the last 72 hours. Thyroid Function Tests: No results for input(s): TSH, T4TOTAL,  FREET4, T3FREE, THYROIDAB in the last 72 hours. Anemia Panel: No results for input(s): VITAMINB12, FOLATE, FERRITIN, TIBC, IRON, RETICCTPCT in the last 72 hours. Urine analysis:    Component Value Date/Time   COLORURINE YELLOW 03/09/2020 0929   APPEARANCEUR CLEAR 03/08/2020 0929   LABSPEC 1.011 03/27/2020 0929   PHURINE 5.0 03/30/2020 0929   GLUCOSEU 50 (A) 04/03/2020 0929   HGBUR SMALL (A) 04/03/2020 0929   BILIRUBINUR NEGATIVE 03/06/2020 0929   BILIRUBINUR negative 12/13/2017 1514   KETONESUR NEGATIVE 03/23/2020 0929   PROTEINUR >=300 (A) 03/29/2020 0929   UROBILINOGEN 0.2 12/13/2017 1514   NITRITE NEGATIVE 03/08/2020 0929   LEUKOCYTESUR NEGATIVE 03/11/2020 0929   Sepsis Labs: @LABRCNTIP (procalcitonin:4,lacticidven:4) ) Recent Results (from the past 240 hour(s))  Resp Panel by RT-PCR (Flu A&B, Covid) Nasopharyngeal Swab     Status: None   Collection Time: 03/10/2020 10:34 AM   Specimen: Nasopharyngeal Swab; Nasopharyngeal(NP) swabs in vial transport medium  Result Value Ref Range Status   SARS Coronavirus 2 by RT PCR NEGATIVE NEGATIVE Final    Comment: (NOTE) SARS-CoV-2 target nucleic acids are NOT DETECTED.  The SARS-CoV-2 RNA is generally detectable in upper respiratory specimens during the acute phase of infection. The lowest concentration of SARS-CoV-2 viral copies this assay can detect is 138 copies/mL. A negative result does not preclude SARS-Cov-2 infection and should not be used as the sole basis for treatment or other patient management decisions. A negative result may occur with  improper specimen collection/handling, submission of specimen other than nasopharyngeal swab, presence of viral mutation(s) within the areas targeted by this assay, and inadequate number of viral copies(<138 copies/mL). A negative result must be combined with clinical observations, patient history, and epidemiological information. The expected result is Negative.  Fact Sheet for  Patients:  EntrepreneurPulse.com.au  Fact Sheet for Healthcare Providers:  IncredibleEmployment.be  This test is no t yet approved or cleared by the [place] FDA and  has been authorized for detection and/or diagnosis of SARS-CoV-2 by  FDA under an Emergency Use Authorization (EUA). This EUA will remain  in effect (meaning this test can be used) for the duration of the COVID-19 declaration under Section 564(b)(1) of the Act, 21 U.S.C.section 360bbb-3(b)(1), unless the authorization is terminated  or revoked sooner.       Influenza A by PCR NEGATIVE NEGATIVE Final   Influenza B by PCR NEGATIVE NEGATIVE Final    Comment: (NOTE) The Xpert Xpress SARS-CoV-2/FLU/RSV plus assay is intended as an aid in the diagnosis of influenza from Nasopharyngeal swab specimens and should not be used as a sole basis for treatment. Nasal washings and aspirates are unacceptable for Xpert Xpress SARS-CoV-2/FLU/RSV testing.  Fact Sheet for Patients: EntrepreneurPulse.com.au  Fact Sheet for Healthcare Providers: IncredibleEmployment.be  This test is not yet approved or cleared by the [place] FDA and has been authorized for detection and/or diagnosis of SARS-CoV-2 by FDA under an Emergency Use Authorization (EUA). This EUA will remain in effect (meaning this test can be used) for the duration of the COVID-19 declaration under Section 564(b)(1) of the Act, 21 U.S.C. section 360bbb-3(b)(1), unless the authorization is terminated or revoked.  Performed at [facility], [place] [address], [place], [place] [address]      Radiological Exams on Admission: CT Angio Head W/Cm &/Or Wo Cm  Result Date: 03/15/2020 CLINICAL DATA:  Intractable headache. EXAM: CT ANGIOGRAPHY HEAD AND NECK TECHNIQUE: Multidetector CT imaging of the head and neck was performed using the standard protocol during bolus administration of intravenous  contrast. Multiplanar CT image reconstructions and MIPs were obtained to evaluate the vascular anatomy. Carotid stenosis measurements (when applicable) are obtained utilizing NASCET criteria, using the distal internal carotid diameter as the denominator. CONTRAST:  1mL OMNIPAQUE IOHEXOL 350 MG/ML SOLN COMPARISON:  None. FINDINGS: CT HEAD FINDINGS Brain: No evidence of acute infarction, hemorrhage, hydrocephalus, extra-axial collection or mass lesion/mass effect. Vascular: No hyperdense vessel. Prominent calcified plaques in the bilateral carotid siphons. Skull: Normal. Negative for fracture or focal lesion. Sinuses: Bilateral mastoid effusion. Orbits: Right lens surgery.  No acute findings. Review of the MIP images confirms the above findings CTA NECK FINDINGS Aortic arch: Common origin of the innominate and left common carotid artery from the aortic arch. Imaged portion shows no evidence of aneurysm or dissection. Calcified plaques are noted in the aortic arch and at the origin of the left subclavian artery without significant stenosis. Right carotid system: Calcified atherosclerotic plaque in the right carotid bifurcation without hemodynamically significant stenosis. There is increased tortuosity of the cervical segment of the right ICA. Left carotid system: Mild atherosclerotic plaques in the left common carotid artery and left carotid bifurcation without hemodynamically significant stenosis. Vertebral arteries: Codominant. No evidence of dissection, stenosis (50% or greater) or occlusion in the neck. Skeleton: Degenerative changes of the cervical spine. No aggressive bone lesion or acute finding. Other neck: Mildly heterogeneous thyroid gland with a 6 mm partially calcified nodule in the left thyroid lobe. Upper chest: Bilateral ground-glass opacities, right greater than left. Multiple lymph nodes in the superior mediastinum with a precarinal lymph node measuring 11 mm. Bilateral pleural effusion, right greater  than left. Review of the MIP images confirms the above findings CTA HEAD FINDINGS Anterior circulation: Calcified plaques in the bilateral carotid siphons with mild stenosis at the paraclinoid segment bilaterally. Hypoplastic right A1/ACA with dominant left A1/ACA segment. Two short segments of occlusion within and M4 segment of the left MCA posterior division branch (series 12, image 15). The bilateral ACA and  MCA vascular trees are otherwise maintained. Posterior circulation: Partially calcified atherosclerotic plaques in the intracranial right vertebral artery resulting in severe stenosis. The intracranial left vertebral artery is maintained. The basilar artery and posterior cerebral arteries have normal course and caliber. Venous sinuses: As permitted by contrast timing, patent. Anatomic variants: Hypoplastic right A1/ACA. Review of the MIP images confirms the above findings IMPRESSION: 1. No acute intracranial abnormality. 2. Two short segments of occlusion within and M4 segment of the left MCA posterior division branch. This may be secondary to intracranial atherosclerotic disease versus embolic. In case of high clinical suspicion for acute infarct, an MRI of the brain suggested. 3. Severe stenosis of the intracranial right vertebral artery. 4. No hemodynamically significant stenosis in the neck. 5. Bilateral pleural effusion, right greater than left. 6. Bilateral ground-glass opacity of the visualized lungs, mostly on the right side. 7. Aortic atherosclerosis. These results were called by telephone at the time of interpretation on 03/16/2020 at 3:31 pm to provider [facility] , who verbally acknowledged these results. Aortic Atherosclerosis (ICD10-I70.0). Electronically Signed   By: Pedro Earls M.D.   On: 03/21/2020 15:34   DG Chest 1 View  Result Date: 03/21/2020 CLINICAL DATA:  Pain following fall EXAM: CHEST  1 VIEW COMPARISON:  November 27, 2019 FINDINGS: There is airspace  consolidation in the left base. There are small pleural effusions bilaterally. There is cardiomegaly with pulmonary vascularity normal. No adenopathy. No pneumothorax. There is aortic atherosclerosis. No bone lesions. IMPRESSION: Airspace opacity left lower lobe concerning for potential pneumonia. Small pleural effusions bilaterally. Cardiomegaly. Note that there may be a degree of underlying congestive heart failure. Electronically Signed   By: Lowella Grip III M.D.   On: 03/08/2020 10:26   CT Head Wo Contrast  Result Date: 03/29/2020 CLINICAL DATA:  Pain following fall.  Reported delirium EXAM: CT HEAD WITHOUT CONTRAST TECHNIQUE: Contiguous axial images were obtained from the base of the skull through the vertex without intravenous contrast. COMPARISON:  January 28, 2019 FINDINGS: Brain: There is mild diffuse atrophy. There is no intracranial mass, hemorrhage, extra-axial fluid collection, or midline shift. Brain parenchyma appears unremarkable. No appreciable acute infarct. Vascular: No hyperdense vessel. There is calcification in each distal vertebral artery and carotid siphon region. Skull: Bony calvarium appears intact. Sinuses/Orbits: There is mucosal thickening in several ethmoid air cells. Other visualized paranasal sinuses are clear. Status post cataract removal on the right. Orbits otherwise appear symmetric bilaterally. Other: Opacification of multiple mastoid air cells noted. IMPRESSION: Atrophy with periventricular small vessel disease, stable. No mass or hemorrhage. Multiple foci of arterial vascular calcification. Opacification of multiple mastoid air cells noted. Mucosal thickening in several ethmoid air cells. Electronically Signed   By: Lowella Grip III M.D.   On: 03/08/2020 10:33   CT Angio Neck W and/or Wo Contrast  Result Date: 03/19/2020 CLINICAL DATA:  Intractable headache. EXAM: CT ANGIOGRAPHY HEAD AND NECK TECHNIQUE: Multidetector CT imaging of the head and neck was  performed using the standard protocol during bolus administration of intravenous contrast. Multiplanar CT image reconstructions and MIPs were obtained to evaluate the vascular anatomy. Carotid stenosis measurements (when applicable) are obtained utilizing NASCET criteria, using the distal internal carotid diameter as the denominator. CONTRAST:  15mL OMNIPAQUE IOHEXOL 350 MG/ML SOLN COMPARISON:  None. FINDINGS: CT HEAD FINDINGS Brain: No evidence of acute infarction, hemorrhage, hydrocephalus, extra-axial collection or mass lesion/mass effect. Vascular: No hyperdense vessel. Prominent calcified plaques in the bilateral carotid siphons. Skull: Normal. Negative for  fracture or focal lesion. Sinuses: Bilateral mastoid effusion. Orbits: Right lens surgery.  No acute findings. Review of the MIP images confirms the above findings CTA NECK FINDINGS Aortic arch: Common origin of the innominate and left common carotid artery from the aortic arch. Imaged portion shows no evidence of aneurysm or dissection. Calcified plaques are noted in the aortic arch and at the origin of the left subclavian artery without significant stenosis. Right carotid system: Calcified atherosclerotic plaque in the right carotid bifurcation without hemodynamically significant stenosis. There is increased tortuosity of the cervical segment of the right ICA. Left carotid system: Mild atherosclerotic plaques in the left common carotid artery and left carotid bifurcation without hemodynamically significant stenosis. Vertebral arteries: Codominant. No evidence of dissection, stenosis (50% or greater) or occlusion in the neck. Skeleton: Degenerative changes of the cervical spine. No aggressive bone lesion or acute finding. Other neck: Mildly heterogeneous thyroid gland with a 6 mm partially calcified nodule in the left thyroid lobe. Upper chest: Bilateral ground-glass opacities, right greater than left. Multiple lymph nodes in the superior mediastinum with a  precarinal lymph node measuring 11 mm. Bilateral pleural effusion, right greater than left. Review of the MIP images confirms the above findings CTA HEAD FINDINGS Anterior circulation: Calcified plaques in the bilateral carotid siphons with mild stenosis at the paraclinoid segment bilaterally. Hypoplastic right A1/ACA with dominant left A1/ACA segment. Two short segments of occlusion within and M4 segment of the left MCA posterior division branch (series 12, image 15). The bilateral ACA and MCA vascular trees are otherwise maintained. Posterior circulation: Partially calcified atherosclerotic plaques in the intracranial right vertebral artery resulting in severe stenosis. The intracranial left vertebral artery is maintained. The basilar artery and posterior cerebral arteries have normal course and caliber. Venous sinuses: As permitted by contrast timing, patent. Anatomic variants: Hypoplastic right A1/ACA. Review of the MIP images confirms the above findings IMPRESSION: 1. No acute intracranial abnormality. 2. Two short segments of occlusion within and M4 segment of the left MCA posterior division branch. This may be secondary to intracranial atherosclerotic disease versus embolic. In case of high clinical suspicion for acute infarct, an MRI of the brain suggested. 3. Severe stenosis of the intracranial right vertebral artery. 4. No hemodynamically significant stenosis in the neck. 5. Bilateral pleural effusion, right greater than left. 6. Bilateral ground-glass opacity of the visualized lungs, mostly on the right side. 7. Aortic atherosclerosis. These results were called by telephone at the time of interpretation on 03/05/2020 at 3:31 pm to provider [facility] , who verbally acknowledged these results. Aortic Atherosclerosis (ICD10-I70.0). Electronically Signed   By: Pedro Earls M.D.   On: 03/16/2020 15:34   DG Knee Complete 4 Views Right  Result Date: 03/04/2020 CLINICAL DATA:  Pain  following fall EXAM: RIGHT KNEE - COMPLETE 4+ VIEW COMPARISON:  None. FINDINGS: Frontal, lateral, and bilateral oblique views were obtained. There is no fracture, dislocation, or joint effusion. There is spurring in all compartments. There is mild narrowing medially. No erosion. IMPRESSION: Areas of osteoarthritic change. No appreciable fracture, dislocation, or joint effusion. Electronically Signed   By: Lowella Grip III M.D.   On: 04/02/2020 10:28   DG HIP UNILAT WITH PELVIS 2-3 VIEWS RIGHT  Result Date: 04/01/2020 CLINICAL DATA:  Pain following fall EXAM: DG HIP (WITH OR WITHOUT PELVIS) 2-3V RIGHT COMPARISON:  None. FINDINGS: Frontal pelvis as well as frontal and lateral right hip images were obtained. There is no fracture or dislocation. There is moderate symmetric  narrowing of each hip joint. No erosive change. IMPRESSION: No acute fracture or dislocation. Moderate symmetric narrowing each hip joint. Electronically Signed   By: Lowella Grip III M.D.   On: 03/12/2020 10:26    EKG: Independently reviewed. Sinus rhythm, PAC, LVH with repolarization abnormality.   Assessment/Plan   1. Acute on chronic combined systolic & diastolic CHF; acute on chronic hypoxic respiratory failure  - Presents with headache and falls, reports recent increase in her chronic SOB, and is found to have marked elevation in BNP, CHF-findings on CXR, and increased supplemental O2 requirement  - Family reports that she continues to be non-adherent with her medications  - EF was 20-25% in June with grade III diastolic dysfunction  - She was given 40 mg IV Lasix in ED but has not had much response  - Continue diuresis, give another 40 mg IV Lasix now, monitor weight and I/Os, continue supplemental O2 to maintain saturation >/=92%    2. Headache; leftt MCA stenosis  - Presents with headache and falls, found to be hypoxic on her usual 4 Lpm supplemental O2, had no acute findings on head CT but noted to have leftt  MCA stenosis on CTA  - Headache has resolved with treatment in ED  - Appreciate neurology recommendations, will check MRI brain    3. CKD IV  - SCr is 1.82 on admission, appears to be her baseline  - Renally-dose medications, monitor    4. Hypokalemia  - Serum potassium is 3.2 in ED  - Replace, monitor daily chem panel while diuresing    5. Type II DM  - Has been diet-controlled recently, serum glucose 103 on admission  - Monitor, use low-intensity SSI if needed    6. ?CAP  - There is increased airspace density at left base on 1v CXR  - There is no fever or leukocytosis, patient has not noted any change in her chronic cough, but was profoundly hypoxic in ED  - Start Rocephin and azithromycin, check procalcitonin, check 2v CXR     DVT prophylaxis: sq heparin  Code Status: DNR, confirmed on admission  Family Communication: Discussed with patient  Disposition Plan:  Patient is from: Home  Anticipated d/c is to: TBD Anticipated d/c date is: 03/20/20 Patient currently: Pending improvement in respiratory status, MRI brain  Consults called: Neurology  Admission status: Inpatient    Vianne Bulls, MD [facility]  03/11/2020, 10:28 PM

## 2020-03-17 NOTE — ED Notes (Signed)
Md made aware of pt on humidified hf [place].

## 2020-03-17 NOTE — Consult Note (Addendum)
Neurology Consultation  CC: headache s/p two falls at home today  History is obtained from: patient and patient's daughter via telephone  HPI: Kara Hanson is a 64 y.o. female with a history significant for type 2 DM, HTN, HLD, spinal stenosis, NSTEMI, chronic kidney disease stage IV, and CHF on home hospice. Kara Hanson was brought to [facility] ED today following 2 falls and complaints of a severe headache. She denies hitting her head, visual changes, LOC, nausea, or vomiting but her examination is limited by patient inconsistencies with providing information. She is not currently on any anticoagulants. She states she had a headache early this morning before she fell but reports that the headache is currently resolved.   Patient lives with her daughter, Kara Hanson, who was contacted for collateral. Kara Hanson endorses that her mother complained of a headache yesterday but did not mention any headaches this morning. She states she is largely bed bound by choice and is noncompliant with her home medications "because they taste bad". Kara Hanson states that her mother's legs are week from deconditioning and rarely does her mother get out of bed. She states she cannot tolerate being out of bed for any time because she is dependent on her oxygen for her CHF management.   LKW: 03/29/2020 @ 23:30 tpa given?: No, out of tPA window, no ischemic etiology.  IR Thrombectomy? No, no thrombus on CT head, no ischemic etiology.  Modified Rankin Scale: 4-Needs assistance to walk and tend to bodily needs NIHSS:  1a Level of Conscious.: 0 1b LOC Questions: 0 1c LOC Commands: 0 2 Best Gaze: 0 3 Visual: 0 (baseline visual deficit on right eye make this unscorable) 4 Facial Palsy: 0 5a Motor Arm - left: 0 5b Motor Arm - Right: 0 6a Motor Leg - Left: 2 6b Motor Leg - Right: 2 7 Limb Ataxia: 0 8 Sensory: 0 9 Best Language: 0 10 Dysarthria: 0 11 Extinct. and Inatten.: 0 TOTAL: 5 Note: Lower extremity motor weakness due  to hip flexor weakness and is secondary to deconditioning -- for acute findings   ROS: A complete ROS was performed and is negative except as noted in the HPI.   Past Medical History:  Diagnosis Date  . Acute CHF (congestive heart failure) ([place]) 09/24/2018  . Acute on chronic combined systolic and diastolic CHF (congestive heart failure) ([place]) 11/27/2017  . Acute on chronic respiratory failure with hypoxia ([place]) 03/20/2018  . Acute renal failure superimposed on stage 3 chronic kidney disease ([place])   . Acute respiratory distress 11/27/2017  . Acute respiratory failure with hypoxia ([place]) 02/09/2019  . Acute systolic CHF (congestive heart failure) ([place]) 05/19/2019  . AKI (acute kidney injury) ([place]) 11/27/2017  . Atelectasis   . Bulging lumbar disc 11/14/2017  . CAP (community acquired pneumonia) 10/31/2018  . Chest pain 06/17/2016  . CHF (congestive heart failure) ([place])   . Chronic diastolic CHF (congestive heart failure) ([place]) 07/20/2018  . Chronic respiratory failure ([place])   . CKD (chronic kidney disease), stage III 03/20/2018  . Controlled type 2 diabetes mellitus with hyperglycemia ([place]) 04/23/2016  . Controlled type 2 diabetes with neuropathy ([place]) 03/20/2018  . COVID-19 virus infection 07/20/2018  . CVA (cerebral vascular accident) ([place])   . Diabetes mellitus without complication ([place])   . Dyspnea   . Elevated troponin 04/18/2019  . Epigastric pain   . Gout 11/14/2017  . Hypercholesteremia   . Hyperlipidemia LDL goal <70 09/30/2016  . Hypertension   . Myocardial infarction ([place])   .  Normochromic normocytic anemia 04/23/2016  . NSTEMI (non-ST elevated myocardial infarction) ([place]) 02/09/2019  . Pneumonia 11/01/2018  . Pneumonia due to COVID-19 virus 07/20/2018  . S/P thoracentesis   . SIRS (systemic inflammatory response syndrome) ([place]) 07/25/2018  . Spinal stenosis   . Stress-induced cardiomyopathy 05/19/2016  . Syncope 04/23/2016  . Type 2 diabetes mellitus with diabetic neuropathy,  unspecified ([place]) 05/06/2016  . Vertigo 05/06/2016  . Vitamin D deficiency 04/02/2018   Past Surgical History:  Procedure Laterality Date  . BIOPSY  01/27/2019   Procedure: BIOPSY;  Surgeon: Otis Brace, MD;  Location: [facility];  Service: Gastroenterology;;  . BLADDER SURGERY    . CARDIAC CATHETERIZATION N/A 04/25/2016   Procedure: Left Heart Cath and Coronary Angiography;  Surgeon: Lorretta Harp, MD;  Location: [place] CV LAB;  Service: Cardiovascular;  Laterality: N/A;  . CARDIAC CATHETERIZATION  02/11/2019  . CESAREAN SECTION    . CHOLECYSTECTOMY    . ESOPHAGOGASTRODUODENOSCOPY (EGD) WITH PROPOFOL N/A 01/27/2019   Procedure: ESOPHAGOGASTRODUODENOSCOPY (EGD) WITH PROPOFOL;  Surgeon: Otis Brace, MD;  Location: [facility];  Service: Gastroenterology;  Laterality: N/A;  . RIGHT HEART CATH N/A 09/30/2019   Procedure: RIGHT HEART CATH;  Surgeon: Jolaine Artist, MD;  Location: [place] CV LAB;  Service: Cardiovascular;  Laterality: N/A;  . RIGHT/LEFT HEART CATH AND CORONARY ANGIOGRAPHY N/A 02/11/2019   Procedure: RIGHT/LEFT HEART CATH AND CORONARY ANGIOGRAPHY;  Surgeon: Jolaine Artist, MD;  Location: [place] CV LAB;  Service: Cardiovascular;  Laterality: N/A;  . VIDEO BRONCHOSCOPY N/A 02/01/2019   Procedure: VIDEO BRONCHOSCOPY WITHOUT FLUORO;  Surgeon: Candee Furbish, MD;  Location: [facility];  Service: Endoscopy;  Laterality: N/A;   Current Outpatient Medications  Medication Instructions  . allopurinol (ZYLOPRIM) 200 mg, Oral, Daily at bedtime  . benzonatate (TESSALON) 100 mg, Oral, 2 times daily PRN  . DULoxetine (CYMBALTA) 30 mg, Oral, Daily  . fluticasone (FLONASE) 50 MCG/ACT nasal spray 2 sprays, Each Nare, Daily PRN  . gabapentin (NEURONTIN) 200 mg, Oral, 2 times daily  . hydrALAZINE (APRESOLINE) 25 mg, Oral, 3 times daily  . hydrOXYzine (ATARAX/VISTARIL) 25 mg, Oral, 2 times daily PRN  . lansoprazole (PREVACID) 15 MG capsule TAKE 1 CAPSULE  (15 MG TOTAL) BY MOUTH DAILY.  Marland Kitchen LORazepam (ATIVAN) 0.5 mg, Oral, Every 4 hours PRN, May crush, mix with water and give sublingually if needed.  Marland Kitchen LORazepam (ATIVAN) 0.5 mg, Oral, As needed  . Misc. Devices MISC Portable oxygen concentrator.  Diagnosis-chronic respiratory failure.  . Misc. Devices MISC [place] with seat. Dx: Congestive Heart Failure  . Misc. Devices MISC Scale; Dx - CHF  . Misc. [place] [facility] bed.  Diagnosis CHF.  Lifetime use.  Weight 165 lbs.  . Misc. Devices [place] Shower chair DX CHF  . Misc. Devices MISC Manual wheelchai with cushion, anti - tippers, brakes and lock.  Diagnosis congestive heart failure.  Weight 140 pounds.  . morphine (ROXANOL) 5 mg, Oral, Every 4 hours PRN  . polyethylene glycol (MIRALAX / GLYCOLAX) 17 g, Oral, Daily PRN  . potassium chloride SA (KLOR-CON) 20 MEQ tablet 20 mEq, Oral, Daily  . primidone (MYSOLINE) 50 mg, Oral, 2 times daily  . promethazine (PHENERGAN) 25 mg, Oral, Every 8 hours PRN  . torsemide (DEMADEX) 40 mg, Oral, 2 times daily    Family History  Problem Relation Age of Onset  . Diabetes Mellitus II Father   . Stroke Father   . Healthy Mother  She is 62 years old.    Social History:  reports that she has never smoked. She has never used smokeless tobacco. She reports that she does not drink alcohol and does not use drugs.  Current Scheduled Medications:   Current PRN Medications:   Exam: Current vital signs: BP (!) 149/137   Pulse 85   Temp 97.8 F (36.6 C) (Oral)   Resp (!) 25   Ht 4\' 11"  (1.499 m)   SpO2 93%   BMI 30.70 kg/m   Physical Exam  Constitutional: Appears chronically ill.  Psych: Affect flat. Cooperative with examination Eyes: Mild papilledema noted to the right eye. No scleral injection.  HENT: No OP obstruction. Head normocephalic, atraumatic. Cardiovascular: Normal rate, extremities cool, 2+ pitting edema of bilateral lower extremities.  Respiratory: On oxygen via nasal canula.  Effort normal, non-labored breathing.  GI: Soft.  No distension. There is no tenderness.  Rectal tone and sensation is normal but she does not squeeze well to command GU: Sensation intact Skin: WDI  Neuro: Mental Status: Patient is awake, alert, oriented to person, place, month, year, and situation. Patient is unable to give a clear and coherent history. Patient is inconsistent with information relayed to provider.  No signs of aphasia or neglect. Cranial Nerves: II: Visual Fields are full. Left pupil equal, round, reactive to light, 52mm --> 61mm brisk. Right pupil irregular, 15mm, ovoid, fixed, nonreactive to light. Per patient, this is a chronic problem with her right eye.  III,IV, VI: EOMI without ptosis or diploplia.  V: Facial sensation is symmetric to temperature. VII: Facial movement is symmetric.  VIII: Hearing is intact to voice X: Uvula elevates symmetrically XI: Shoulder shrug is symmetric. XII: Tongue is midline without atrophy or fasciculations.  Motor: Tone is normal. Bulk is normal.  Examination was significantly effort dependent but with coaching she achieved 4/5 strength throughout except for hip flexor strength 3/5 bilaterally (briefly antigravity, couldn't sustain antigravity).  She additionally has a intermittent rest tremor that is exacerbated with movement, the tremor involves her head and tongue as well as bilateral upper extremities Sensory: Sensation is symmetric to light touch and temperature in the arms and legs. Deep Tendon Reflexes: 2+ and symmetric in the biceps. 3+ and symmetric in the patellae. Plantars: Toes are downgoing bilaterally. Cerebellar: FNF is intact bilaterally with some difficulty performing the exam due aphasia and English as a second language.   I have reviewed labs in epic and the pertinent results are: Potassium 3.2 being replaced in the ED, Cr 1.82, BNP 3,886.5  I have reviewed the images obtained: CT Head without contrast  12/14: IMPRESSION: Atrophy with periventricular small vessel disease, stable. No mass or hemorrhage. Multiple foci of arterial vascular calcification. Opacification of multiple mastoid air cells noted. Mucosal thickening in several ethmoid air cells.  CT angio head IMPRESSION: 1. No acute intracranial abnormality. 2. Two short segments of occlusion within and M4 segment of the left MCA posterior division branch. This may be secondary to intracranial atherosclerotic disease versus embolic. In case of high clinical suspicion for acute infarct, an MRI of the brain suggested. 3. Severe stenosis of the intracranial right vertebral artery. 4. No hemodynamically significant stenosis in the neck. 5. Bilateral pleural effusion, right greater than left. 6. Bilateral ground-glass opacity of the visualized lungs, mostly on the right side. 7. Aortic atherosclerosis.  CT angio neck  IMPRESSION: 1. No acute intracranial abnormality. 2. Two short segments of occlusion within and M4 segment of the left MCA  posterior division branch. This may be secondary to intracranial atherosclerotic disease versus embolic. In case of high clinical suspicion for acute infarct, an MRI of the brain suggested. 3. Severe stenosis of the intracranial right vertebral artery. 4. No hemodynamically significant stenosis in the neck. 5. Bilateral pleural effusion, right greater than left. 6. Bilateral ground-glass opacity of the visualized lungs, mostly on the right side. 7. Aortic atherosclerosis.  Primary Diagnosis: Headache and deconditioning  Secondary Diagnosis: Essential (primary) hypertension, Chronic systolic (congestive) heart failure, Type 2 diabetes mellitus w/o complications, CKD Stage 3 (GFR 30-59) and Hypokalemia  Impression: Ms. Zilberman is a 64 year old female with a history significant for type 2 DM, HTN, HLD, spinal stenosis, NSTEMI, chronic kidney disease stage IV, and CHF on home hospice. She  presented today to the ED 12/14 via EMS with complaints of a severe headache following 2 falls at home this morning without head trauma or LOC. CT scans revealed no acute processes but identified chronic atherosclerotic disease, severe stenosis of the intracranial right vertebral artery, aortic atherosclerosis, and two short segments of occlusion within and M4 segment of the left MCA posterior division branch.   Neuro consulted given initial concern for which headache of life.  However on further history and collateral from family patient's headache does not appear to have been particularly severe even at onset, and therefore unlikely to be related to serious etiology such as subarachnoid hemorrhage.  Suspect her left MCA findings are chronic and explain her chronic aphasia, however given varying history regarding chronicity/severity of her symptoms will obtain MRI brain to clarify.  Suspect her bilateral hip flexion weakness is secondary to chronic deconditioning and the remainder of her exam is reassuring against any acute spinal cord process  Recommendations:  - MRI of brain - Stroke workup only if MRI positive  - Management of heart failure per ED / cardiology / hospice teams  Pt seen by NP/Neuro and MD concurrently  Anibal Henderson, AGAC-NP [facility] Pager: [phone]  Attending Neurologist's note:  I personally saw this patient, gathering history, performing a full neurologic examination, reviewing relevant labs, personally reviewing relevant imaging including head CT and CTA, and formulated the assessment and plan, adding the note above for completeness and clarity to accurately reflect my thoughts  Lesleigh Noe MD-PhD [facility] [phone]   Addendum:   # L MCA stroke cardioembolic vs. atheroembolic - Stroke labs TSH, HgbA1c, fasting lipid panel - MRI brain completed as above  - CTA completed  - Frequent neuro checks - Echocardiogram ordered  -  Prophylactic therapy-Antiplatelet med: Aspirin - dose 325mg  daily (may reduce to 81 mg, but felt 325 reasonable given likely cardioembolic source, reduce to 81 mg daily for CAD if anticoagulation is started - Consider Plavix 300 mg load with 75 mg daily for 21 - 90 day course if no indication for [facility] found - Risk factor modification - Telemetry monitoring; 30 day event monitor on discharge if no arrythmias captured  - Blood pressure goal: Normotension - PT consult, OT consult, Speech consult, unless patient is back to baseline - Appreciate ongoing [place] discussion per primary team / cardiology   - Stroke team to follow

## 2020-03-17 NOTE — ED Notes (Signed)
Pt desat to 56s. RT called to take a look at pt for a high flow [place].

## 2020-03-17 NOTE — ED Notes (Signed)
Paged Dr. Myna Hidalgo to RN, Fredrich Birks

## 2020-03-17 NOTE — ED Notes (Signed)
Help get patient undress on the monitor did ekg shown to er doctor patient is resting with call bell in reach

## 2020-03-18 ENCOUNTER — Inpatient Hospital Stay (HOSPITAL_COMMUNITY)

## 2020-03-18 DIAGNOSIS — I63512 Cerebral infarction due to unspecified occlusion or stenosis of left middle cerebral artery: Secondary | ICD-10-CM

## 2020-03-18 DIAGNOSIS — I6389 Other cerebral infarction: Secondary | ICD-10-CM

## 2020-03-18 DIAGNOSIS — I639 Cerebral infarction, unspecified: Secondary | ICD-10-CM

## 2020-03-18 LAB — HEMOGLOBIN A1C
Hgb A1c MFr Bld: 6.8 % — ABNORMAL HIGH (ref 4.8–5.6)
Mean Plasma Glucose: 148.46 mg/dL

## 2020-03-18 LAB — LIPID PANEL
Cholesterol: 180 mg/dL (ref 0–200)
HDL: 34 mg/dL — ABNORMAL LOW (ref >40)
LDL Cholesterol: 125 mg/dL — ABNORMAL HIGH (ref 0–99)
Total CHOL/HDL Ratio: 5.3 RATIO
Triglycerides: 104 mg/dL (ref <150)
VLDL: 21 mg/dL (ref 0–40)

## 2020-03-18 LAB — MAGNESIUM: Magnesium: 2.3 mg/dL (ref 1.7–2.4)

## 2020-03-18 LAB — CBC
HCT: 37.3 % (ref 36.0–46.0)
Hemoglobin: 11.3 g/dL — ABNORMAL LOW (ref 12.0–15.0)
MCH: 26.9 pg (ref 26.0–34.0)
MCHC: 30.3 g/dL (ref 30.0–36.0)
MCV: 88.8 fL (ref 80.0–100.0)
Platelets: 218 10*3/uL (ref 150–400)
RBC: 4.2 MIL/uL (ref 3.87–5.11)
RDW: 16.4 % — ABNORMAL HIGH (ref 11.5–15.5)
WBC: 6.3 10*3/uL (ref 4.0–10.5)
nRBC: 0 % (ref 0.0–0.2)

## 2020-03-18 LAB — ECHOCARDIOGRAM LIMITED
Height: 59 in
S' Lateral: 4.9 cm
Single Plane A4C EF: 20.4 %
Weight: 2321 oz

## 2020-03-18 LAB — BASIC METABOLIC PANEL
Anion gap: 13 (ref 5–15)
BUN: 38 mg/dL — ABNORMAL HIGH (ref 8–23)
CO2: 24 mmol/L (ref 22–32)
Calcium: 8.9 mg/dL (ref 8.9–10.3)
Chloride: 102 mmol/L (ref 98–111)
Creatinine, Ser: 2.06 mg/dL — ABNORMAL HIGH (ref 0.44–1.00)
GFR, Estimated: 26 mL/min — ABNORMAL LOW (ref >60)
Glucose, Bld: 121 mg/dL — ABNORMAL HIGH (ref 70–99)
Potassium: 4.3 mmol/L (ref 3.5–5.1)
Sodium: 139 mmol/L (ref 135–145)

## 2020-03-18 LAB — URINE CULTURE: Culture: NO GROWTH

## 2020-03-18 LAB — PROCALCITONIN
Procalcitonin: <0.1 ng/mL
Procalcitonin: <0.1 ng/mL

## 2020-03-18 LAB — TSH: TSH: 3.311 u[IU]/mL (ref 0.350–4.500)

## 2020-03-18 MED ORDER — POTASSIUM CHLORIDE CRYS ER 20 MEQ PO TBCR: 20.0000 meq | EXTENDED_RELEASE_TABLET | Freq: Every day | ORAL | Status: DC

## 2020-03-18 MED ORDER — PRIMIDONE 50 MG PO TABS
50.0000 mg | ORAL_TABLET | Freq: Every day | ORAL | Status: DC
  Administered 2020-03-19 – 2020-03-21 (×3): 50 mg via ORAL
  Filled 2020-03-18 (×4): qty 1

## 2020-03-18 MED ORDER — AZITHROMYCIN 500 MG PO TABS: 500.0000 mg | ORAL_TABLET | Freq: Every day | ORAL | Status: DC

## 2020-03-18 MED ORDER — ASPIRIN 325 MG PO TABS
325.0000 mg | ORAL_TABLET | Freq: Every day | ORAL | Status: DC
  Administered 2020-03-19: 09:00:00 325 mg via ORAL
  Filled 2020-03-18: qty 1

## 2020-03-18 MED ORDER — ASPIRIN 300 MG RE SUPP
300.0000 mg | Freq: Every day | RECTAL | Status: DC
  Administered 2020-03-18: 11:00:00 300 mg via RECTAL
  Filled 2020-03-18 (×3): qty 1

## 2020-03-18 MED ORDER — SODIUM CHLORIDE 0.9 % IV SOLN: INTRAVENOUS | Status: AC

## 2020-03-18 MED ORDER — ASPIRIN 325 MG PO TABS
325.0000 mg | ORAL_TABLET | Freq: Every day | ORAL | Status: DC
  Filled 2020-03-18: qty 1

## 2020-03-18 MED ORDER — CLOPIDOGREL BISULFATE 75 MG PO TABS
75.0000 mg | ORAL_TABLET | Freq: Every day | ORAL | Status: DC
  Administered 2020-03-19: 09:00:00 75 mg via ORAL
  Filled 2020-03-18: qty 1

## 2020-03-18 MED ORDER — STROKE: EARLY STAGES OF RECOVERY BOOK
Freq: Once | Status: DC
  Filled 2020-03-18: qty 1

## 2020-03-18 MED ORDER — ALLOPURINOL 100 MG PO TABS
200.0000 mg | ORAL_TABLET | Freq: Every day | ORAL | Status: DC
  Administered 2020-03-19 – 2020-03-21 (×3): 200 mg via ORAL
  Filled 2020-03-18 (×3): qty 2

## 2020-03-18 MED ORDER — ATORVASTATIN CALCIUM 80 MG PO TABS
80.0000 mg | ORAL_TABLET | Freq: Every day | ORAL | Status: DC
  Administered 2020-03-19: 09:00:00 80 mg via ORAL
  Filled 2020-03-18: qty 1

## 2020-03-18 MED ORDER — DULOXETINE HCL 30 MG PO CPEP
30.0000 mg | ORAL_CAPSULE | Freq: Every day | ORAL | Status: DC
  Administered 2020-03-19 – 2020-03-21 (×3): 30 mg via ORAL
  Filled 2020-03-18 (×3): qty 1

## 2020-03-18 MED ORDER — PANTOPRAZOLE SODIUM 40 MG PO TBEC
40.0000 mg | DELAYED_RELEASE_TABLET | Freq: Every day | ORAL | Status: DC
  Administered 2020-03-19: 09:00:00 40 mg via ORAL
  Filled 2020-03-18: qty 1

## 2020-03-18 NOTE — Progress Notes (Signed)
Palliative Medicine RN Note: Consult order noted. During chart review, I noted that pt was sent home with hospice in August. She is active with [facility] [phone]), and they confirmed that she is DNR with them as well. They were unaware of her visit to ED and admission to the hospital.  Marjie Skiff. Renold Kozar, RN, BSN, [facility] Palliative Medicine Team 03/18/2020 10:49 AM Office [phone]

## 2020-03-18 NOTE — Progress Notes (Addendum)
No urine output, bladder scan 159 ml, bladder not distended, no complaints at this time. MD aware made orders. PT at bedside.

## 2020-03-18 NOTE — Progress Notes (Signed)
STROKE TEAM PROGRESS NOTE   INTERVAL HISTORY Her RN is at the bedside.  Pt lying in bed, lethargic but eyes open, expressive > receptive aphasia. Garbled words not make sense, however, she was able to repeat 3-word sentence and able to name 2/4. Has right FTN mild ataxia but moving all extremities equally. MRI showed left MCA 2 patchy small infarcts  OBJECTIVE Vitals:   03/18/20 0440 03/18/20 0600 03/18/20 0732 03/18/20 0800  BP: 99/65  104/61   Pulse: 83  (!) 113   Resp: 16  18   Temp: 98.1 F (36.7 C)  98 F (36.7 C)   TempSrc: Oral  Oral   SpO2: 100% 93% 100% 100%  Weight:      Height:        CBC:  Recent Labs  Lab 03/13/2020 0926 03/18/20 0311  WBC 6.0 6.3  NEUTROABS 4.9  --   HGB 11.7* 11.3*  HCT 39.7 37.3  MCV 90.2 88.8  PLT 266 408    Basic Metabolic Panel:  Recent Labs  Lab 04/02/2020 0926 03/11/2020 1458 03/18/20 0311  NA 141  --  139  K 3.2*  --  4.3  CL 100  --  102  CO2 25  --  24  GLUCOSE 103*  --  121*  BUN 35*  --  38*  CREATININE 1.82*  --  2.06*  CALCIUM 9.3  --  8.9  MG  --  1.8 2.3    Lipid Panel:     Component Value Date/Time   CHOL 180 03/18/2020 0729   CHOL 353 (H) 01/09/2019 0959   TRIG 104 03/18/2020 0729   HDL 34 (L) 03/18/2020 0729   HDL 52 01/09/2019 0959   CHOLHDL 5.3 03/18/2020 0729   VLDL 21 03/18/2020 0729   LDLCALC 125 (H) 03/18/2020 0729   LDLCALC 251 (H) 01/09/2019 0959   HgbA1c:  Lab Results  Component Value Date   HGBA1C 6.8 (H) 03/18/2020   Urine Drug Screen:     Component Value Date/Time   LABOPIA NONE DETECTED 03/10/2020 1450   COCAINSCRNUR NONE DETECTED 03/24/2020 1450   LABBENZ NONE DETECTED 03/07/2020 1450   AMPHETMU NONE DETECTED 03/22/2020 1450   THCU NONE DETECTED 03/13/2020 1450   LABBARB NONE DETECTED 03/27/2020 1450    Alcohol Level No results found for: ETH  IMAGING  CT Angio Head W/Cm &/Or Wo Cm CT Angio Neck W and/or Wo Contrast 03/19/2020 IMPRESSION:  1. No acute intracranial  abnormality.  2. Two short segments of occlusion within and M4 segment of the left MCA posterior division branch. This may be secondary to intracranial atherosclerotic disease versus embolic. In case of high clinical suspicion for acute infarct, an MRI of the brain suggested.  3. Severe stenosis of the intracranial right vertebral artery.  4. No hemodynamically significant stenosis in the neck.  5. Bilateral pleural effusion, right greater than left.  6. Bilateral ground-glass opacity of the visualized lungs, mostly on the right side.  7. Aortic atherosclerosis.   CT Head Wo Contrast 03/05/2020 IMPRESSION:  Atrophy with periventricular small vessel disease, stable. No mass or hemorrhage. Multiple foci of arterial vascular calcification. Opacification of multiple mastoid air cells noted. Mucosal thickening in several ethmoid air cells.   MR BRAIN WO CONTRAST 03/18/2020 IMPRESSION:  1. Small, patchy acute infarcts in posterior Left MCA territory cortex. No associated hemorrhage or mass effect.  2. Underlying advanced signal changes in the cerebral white matter and pons, nonspecific but most commonly due to  chronic small vessel disease. Solitary chronic microhemorrhage in the right frontal lobe.  3. Bilateral mastoid air cell effusions, probably postinflammatory.   DG Chest 1 View 03/15/2020 IMPRESSION:  Airspace opacity left lower lobe concerning for potential pneumonia. Small pleural effusions bilaterally. Cardiomegaly. Note that there may be a degree of underlying congestive heart failure.   DG Chest 2 View 03/18/2020 IMPRESSION:  Low lung volumes and persistent opacity at the right lung base, a combination of partially loculated pleural fluid and atelectasis/consolidation. Left lower lobe lobar pneumonia not excluded given the suggestion of air bronchograms.    DG Knee Complete 4 Views Right 03/31/2020 IMPRESSION:  Areas of osteoarthritic change. No appreciable fracture,  dislocation, or joint effusion.    DG HIP UNILAT WITH PELVIS 2-3 VIEWS RIGHT 03/16/2020 IMPRESSION:  No acute fracture or dislocation. Moderate symmetric narrowing each hip joint.   Transthoracic Echocardiogram  1. Left ventricular ejection fraction, by estimation, is 20 to 25%. The  left ventricle has severely decreased function. The left ventricle  demonstrates regional wall motion abnormalities (see scoring  diagram/findings for description). The left  ventricular internal cavity size was mildly dilated. There is akinesis of  the left ventricular, entire anterior wall, anterolateral wall and apical  segment. There is akinesis of the left ventricular, mid-apical lateral  wall, inferolateral wall and  inferior wall. There is akinesis of the left ventricular, basal-mid  anteroseptal wall and inferoseptal wall.  2. Mild mitral valve regurgitation.  3. Large pleural effusion in the left lateral region.  4. The inferior vena cava is normal in size with <50% respiratory  variability, suggesting right atrial pressure of 8 mmHg.  5. Tricuspid regurgitation signal is inadequate for assessing PA  pressure.   ECG - SR rate 87 BPM. (See cardiology reading for complete details)  PHYSICAL EXAM  Temp:  [97.8 F (36.6 C)-98.5 F (36.9 C)] 98.4 F (36.9 C) (12/15 1000) Pulse Rate:  [82-119] 112 (12/15 1128) Resp:  [12-30] 16 (12/15 1128) BP: (99-149)/(60-137) 126/81 (12/15 1128) SpO2:  [93 %-100 %] 99 % (12/15 1128) Weight:  [65.8 kg] 65.8 kg (12/15 0100)  General - Well nourished, well developed, in no apparent distress, but lethargic.  Ophthalmologic - fundi not visualized due to noncooperation.  Cardiovascular - Regular rhythm and tachycardia 110s  Neuro - awake alert, eyes open, mildly lethargic, able to tell me her first name but then became garbled speech, not making sense. Expressive aphasia but able to repeat 3-word sentence but no more than that, able to name 2/4. Follows  most one step commands, not able to follow two step commands. Tracking bilaterally, blinking to visual threat bilaterally. Mild right nasolabial fold flattening. Tongue midline. BUE at least 3+/5, however, right FTN dysmetria. Left FTN intact although slow. BLE 3/5 proximal and 3+/5 distally. Sensation subjectively symmetrical. Gait not tested.    ASSESSMENT/PLAN Kara Hanson is a 64 y.o. female with history of type 2 DM, HTN, HLD, previous stroke, hx of Covid infection (April 2020), spinal stenosis, NSTEMI, chronic kidney disease stage IV, and CHF (O2 dependent) on home hospice - noncompliant with medications  - (spends most of her time in bed  legs weak from deconditioning) Ms. Skalsky was brought to [facility] ED after 2 falls and complaints of a severe headache (poor historian). She did not receive IV t-PA due to late presentation (>4.5 hours from time of onset).  Stroke: small, patchy acute infarcts in posterior Left MCA territory cortex - likely embolic due to low EF  CT head - Atrophy with periventricular small vessel disease, stable. No mass or hemorrhage. Multiple foci of arterial vascular calcification.   MRI head - Small, patchy acute infarcts in posterior Left MCA territory cortex. No associated hemorrhage or mass effect.    CTA H&N -  Two short segments of occlusion within and M4 segment of the left MCA posterior division branch. Severe stenosis of the intracranial right vertebral artery.   2D Echo - EF 20-25%  [employer] Virus 2 - negative  LE Venous Dopplers no DVT  LDL - 125  HgbA1c - 6.8  UDS - negative  VTE prophylaxis - Subcutaneous heparin  No antithrombotic prior to admission, now on aspirin 300 mg suppository daily. Further antithrombotic regimen per GOC.   Patient will be counseled to be compliant with her antithrombotic medications  Ongoing aggressive stroke risk factor management  Therapy recommendations:  pending  Disposition:  Pending  Cardiomyopathy  with low EF  04/2019 EF 40-45%  09/2019 EF 20-25% - cardiology determined that pt end stage heart failure - home hospice  This admission EF 20-25% - further treatment per GOC   CAD and hx of STEMI  Not on antithrombotics at home for home hospice  Currently on ASA PR  Further regimen per [place] discussion  Hypertension  Home BP meds: Hydralazine  Current BP meds: none  BP soft . Long-term BP goal normotensive  Hyperlipidemia  Home Lipid lowering medication: none   LDL 125, goal < 70  Current lipid lowering medication: Lipitor 80 mg daily   Further regimen per [place] discussion  Diabetes  Home diabetic meds: none  SSI  CBG monitoring  HgbA1c 6.8, goal < 7.0  Other Stroke Risk Factors  Advanced age  Obesity, Body mass index is 29.3 kg/m., recommend weight loss, diet and exercise as appropriate   Family hx stroke (father)   Other Active Problems, Findings and Recommendations  Code status - DNR on home hospice - Palliative Care consult pending  CKD - stage 3b - creatinine - 1.82->2.06  Bilateral pleural effusion, right greater than left.   Bilateral ground-glass opacity of the visualized lungs, mostly on the right side.   Aortic atherosclerosis.    Hospital day # 1  Rosalin Hawking, MD PhD Stroke Neurology 03/18/2020 6:52 PM    To contact Stroke Continuity provider, please refer to [email or web address] After hours, contact General Neurology

## 2020-03-18 NOTE — TOC Progression Note (Signed)
Transition of Care [facility]) - Progression Note    Patient Details  Name: Kara Hanson MRN: [id number] Date of Birth: 1955/04/11  Transition of Care [facility]) CM/SW Contact  Zenon Mayo, RN Phone Number: 03/18/2020, 4:12 PM  Clinical Narrative:    Patient is active with [facility]. TOC team will follow for dc needs.        Expected Discharge Plan and Services                                                 Social Determinants of Health (SDOH) Interventions    Readmission Risk Interventions Readmission Risk Prevention Plan 12/02/2019 10/03/2019 05/22/2019  Transportation Screening Complete Complete Complete  PCP or Specialist Appt within 3-5 Days - - -  Not Complete comments - - -  HRI or [place] for [place] - - -  Medication Review Press photographer) Complete Complete Complete  PCP or Specialist appointment within 3-5 days of discharge Complete - Complete  HRI or [place] Complete Complete Complete  SW Recovery Care/Counseling Consult Complete Complete Complete  Palliative Care Screening Complete Not Applicable Not [place] Not Applicable Not Applicable Not Applicable  Some recent data might be hidden

## 2020-03-18 NOTE — Evaluation (Signed)
Physical Therapy Evaluation Patient Details Name: Kara Hanson MRN: [id number] DOB: Apr 18, 1955 Today's Date: 03/18/2020   History of Present Illness  64 yo female admitted to ED on 12/14 with SOB, headaches, and falls. On admission, pt was hypoxic (SpO2 in the 60s) and CXR was notable for opacity in the L base concerning for PNA. MRI revealed small acute infarcts in the posterior L MCA territory. There was also evidence of more chronic small vessel disease in the pons. PMH includes vertigo, DM, syncope, PNA, NSTEMI, HTN, HLD, dyspnea, CVA, CKD, CHF.  Clinical Impression   Pt presents with generalized weakness, expressive asphasia, difficulty performing mobility tasks, and decreased activity tolerance vs baseline. Pt to benefit from acute PT to address deficits. Pt ambulated short room distance with RW, requiring min to mod assist for all mobility tasks. SpO2 86% on 4LO2 and HR 120s-130s during ambulation, recovering to 90s with rest and breathing technique. PT recommending return home with hospice if pt is close to baseline and family can manage at home, otherwise pt may need SNF level of care post-acutely. PT feels she may be close to baseline mobility-wise. PT to progress mobility as tolerated, and will continue to follow acutely.      Follow Up Recommendations SNF;Other (comment) (vs continue home with hospice)    Equipment Recommendations  None recommended by PT    Recommendations for Other Services       Precautions / Restrictions Precautions Precautions: Fall Precaution Comments: 0NL9 chronically Restrictions Weight Bearing Restrictions: No      Mobility  Bed Mobility Overal bed mobility: Needs Assistance Bed Mobility: Supine to Sit;Sit to Supine     Supine to sit: Mod assist;HOB elevated Sit to supine: Mod assist;HOB elevated   General bed mobility comments: Mod assist for LE and trunk management, scoot to/from EOB. Increased time, pt with rattling cough once sitting  EOB.    Transfers Overall transfer level: Needs assistance Equipment used: Rolling walker (2 wheeled) Transfers: Sit to/from Stand Sit to Stand: Min assist;From elevated surface         General transfer comment: Min assist for power up, steadying upon standing, and posterior support. Increased time to rise, partially due to pt fear of falling.  Ambulation/Gait Ambulation/Gait assistance: Min assist Gait Distance (Feet): 5 Feet Assistive device: Rolling walker (2 wheeled) Gait Pattern/deviations: Step-through pattern;Decreased stride length;Trunk flexed Gait velocity: decr   General Gait Details: Min assist to steady, manage lines/leads, physically navigate RW. Pt fearful of falling once ambulating, exclaiming and flailing when backing up to bed.  Stairs            Wheelchair Mobility    Modified Rankin (Stroke Patients Only) Modified Rankin (Stroke Patients Only) Pre-Morbid Rankin Score: Moderately severe disability Modified Rankin: Moderately severe disability     Balance                                             Pertinent Vitals/Pain Pain Assessment: Faces Faces Pain Scale: Hurts a little bit Pain Location: points to chest Pain Descriptors / Indicators: Discomfort Pain Intervention(s): Limited activity within patient's tolerance;Monitored during session;Repositioned    Home Living Family/patient expects to be discharged to:: Private residence Living Arrangements: Children Available Help at Discharge: Family;Available 24 hours/day;Personal care attendant Type of Home: House Home Access: Level entry     Home Layout: One level Home Equipment: Gilford Rile -  2 wheels;Tub bench;Wheelchair - manual      Prior Function Level of Independence: Needs assistance   Gait / Transfers Assistance Needed: pt reports using RW for short distances, using w/c  ADL's / Homemaking Assistance Needed: pt receives assist from daugther and aide  Comments:  Uses 4L of O2 via [place] at home      Hand Dominance   Dominant Hand: Right    Extremity/Trunk Assessment   Upper Extremity Assessment Upper Extremity Assessment: Defer to OT evaluation    Lower Extremity Assessment Lower Extremity Assessment: Generalized weakness       Communication   Communication: No difficulties  Cognition Arousal/Alertness: Awake/alert Behavior During Therapy: WFL for tasks assessed/performed Overall Cognitive Status: Difficult to assess                                 General Comments: Pt very pleasant. Pt answers questions in yes/no fashion with head nods, becomes frustrated when unable to communicate needs or statements. Pt with perseverative vocalizations, especially when PT does not understand. Follows all commands, at times has difficulty terminating tasks.      General Comments      Exercises     Assessment/Plan    PT Assessment Patient needs continued PT services  PT Problem List Decreased strength;Decreased mobility;Decreased activity tolerance;Decreased balance;Decreased knowledge of use of DME;Pain;Cardiopulmonary status limiting activity;Decreased safety awareness       PT Treatment Interventions Therapeutic activities;DME instruction;Gait training;Patient/family education;Therapeutic exercise;Balance training;Functional mobility training;Neuromuscular re-education    PT Goals (Current goals can be found in the Care Plan section)  Acute Rehab PT Goals Patient Stated Goal: "move normally" PT Goal Formulation: With patient Time For Goal Achievement: 04/01/20 Potential to Achieve Goals: Good    Frequency Min 3X/week   Barriers to discharge        Co-evaluation               AM-PAC PT "6 Clicks" Mobility  Outcome Measure Help needed turning from your back to your side while in a flat bed without using bedrails?: A Little Help needed moving from lying on your back to sitting on the side of a flat bed without  using bedrails?: A Lot Help needed moving to and from a bed to a chair (including a wheelchair)?: A Lot Help needed standing up from a chair using your arms (e.g., wheelchair or bedside chair)?: A Lot Help needed to walk in hospital room?: A Lot Help needed climbing 3-5 steps with a railing? : Total 6 Click Score: 12    End of Session Equipment Utilized During Treatment: Gait belt;Oxygen Activity Tolerance: Patient tolerated treatment well;Patient limited by fatigue Patient left: in bed;with call bell/phone within reach;with bed alarm set;with nursing/sitter in room;Other (comment) (transport in room) Nurse Communication: Mobility status PT Visit Diagnosis: Difficulty in walking, not elsewhere classified (R26.2);Muscle weakness (generalized) (M62.81)    Time: 1505-6979 PT Time Calculation (min) (ACUTE ONLY): 31 min   Charges:   PT Evaluation $PT Eval Low Complexity: 1 Low PT Treatments $Therapeutic Activity: 8-22 mins        Stacie Glaze, PT Acute Rehabilitation Services Pager [phone]  Office [phone]'   [place] E Stroup 03/18/2020, 4:25 PM

## 2020-03-18 NOTE — Progress Notes (Signed)
ReDS Clip Diuretic Study Pt study # [id number]  Your patient is in the Blinded arm of the ReDS Clip Diuretic study.  Your patient has had a ReDS reading and the reading has been transmitted to the cloud.   Thank You   The research team   Kerby Nora, PharmD, BCPS Heart Failure Stewardship Pharmacist Phone [phone]  Please check [email or web address] for unit-specific pharmacist phone numbers

## 2020-03-18 NOTE — Progress Notes (Addendum)
[facility] PROGRESS NOTE    Progress Note  Kara Hanson  FTD:[id number] DOB: [birth year] DOA: 03/12/2020 PCP: [name], MD     Brief Narrative:   Kara Hanson is an 64 y.o. female past medical history of chronic combined systolic and diastolic heart failure on 4 L supplemental oxygen for COPD, chronic kidney disease stage IV not adherence to her medication, mostly bedbound with severe deconditioning per family's report who has suffered 2 falls over the last 24 hours, comes to the ED for shortness of breath headaches and fall.  She was found afebrile saturating in the 60s chest x-ray was notable for left based air opacity and a small bilateral pleural effusion imaging showed no acute findings except except for CTA neck that showed 2 short segments of occlusion within the left MCA, BNP of 4000, troponins slightly elevated but flat neurology was consulted by the emergency room physician.  She was started on IV Lasix Benadryl Toradol fentanyl Zofran and potassium supplements.  Assessment/Plan:   Acute CVA: Headache resolved on admission, MRI of the brain was ordered showed small acute infarct of the left MCA territory no hemorrhage or mass-effect.  Chronic small vessel disease to cerebral white matter and pons. HgbA1c 6.8, fasting lipid panel HDL < 40, LDL > 70 start statins PT, OT, Speech consult  Start patient on ASA 81mg  daily and plavix 75mg  daily  Atorvastatin 80  BP goal: permissive HTN upto 220/120 mmHg Telemetry monitoring Neurology was consulted and awaiting recommendations. We will need to get palliative Care.  Chronic combined systolic and diastolic CHF (congestive heart failure) (HCC) Chest x-ray showing basilar and bilateral pleural effusions, with elevated BNP. Last 2D echo showed an EF of 20% with grade 3 diastolic heart failure she was started on IV Lasix in the ED. Continue strict I's and O's and daily weights her saturations have improved. Restrict her  fluid intake. Discontinue Rocephin and azithromycin she has remained afebrile no leukocytosis procalcitonin is low yield. These effusions appear to be chronic, she appears euvolemic on physical exam going back through the weight in her chart her weight is unchanged, she her estimated dry weight is around 63-65 kg. Discontinue IV Lasix.  Chronic kidney disease stage IV: Creatinine appears to be at baseline around 1.8.  Hypokalemia: Improved now 4.3, will put her on a supplement as she will be on IV Lasix.  Type 2 diabetes mellitus with diabetic neuropathy, unspecified ([place]) Fairly controlled continue sliding scale insulin, hemoglobin A1c of 6.8.   DVT prophylaxis: lovenox Family Communication:none Status is: Inpatient  Remains inpatient appropriate because:Hemodynamically unstable   Dispo: The patient is from: Home              Anticipated d/c is to: Home              Anticipated d/c date is: > 3 days              Patient currently is not medically stable to d/c.   Code Status:     Code Status Orders  (From admission, onward)         Start     Ordered   03/26/2020 2223  Do not attempt resuscitation (DNR)  Continuous       Question Answer Comment  In the event of cardiac or respiratory ARREST Do not call a "code blue"   In the event of cardiac or respiratory ARREST Do not perform Intubation, CPR, defibrillation or ACLS   In the event of cardiac  or respiratory ARREST Use medication by any route, position, wound care, and other measures to relive pain and suffering. May use oxygen, suction and manual treatment of airway obstruction as needed for comfort.      03/18/2020 2225        Code Status History    Date Active Date Inactive Code Status Order ID Comments User Context   11/29/2019 1309 12/02/2019 2123 DNR [id number]  Lin Landsman, NP Inpatient   11/27/2019 1701 11/29/2019 1309 Full Code [id number]  Darliss Cheney, MD ED   11/19/2019 0830 11/26/2019 1901 Full Code [id number]   Norval Morton, MD ED   09/26/2019 1506 10/07/2019 2156 Full Code [id number]  Mckinley Jewel, MD ED   05/18/2019 1240 05/23/2019 0153 Full Code [id number]  Lequita Halt, MD ED   04/18/2019 0556 04/20/2019 2128 Full Code [id number]  Shela Leff, MD Inpatient   02/09/2019 0436 02/19/2019 2225 Full Code [id number]  Rise Patience, MD ED   01/23/2019 1757 02/08/2019 0133 Full Code [id number]  Georgette Shell, MD ED   11/01/2018 0029 11/05/2018 2014 Full Code [id number]  Sid Falcon, MD ED   09/24/2018 2303 10/02/2018 1600 Full Code [id number]  Elwyn Reach, MD Inpatient   07/20/2018 1655 07/26/2018 1800 Full Code [id number]  Debbe Odea, MD ED   03/20/2018 0402 03/24/2018 1949 Full Code [id number]  Norval Morton, MD ED   11/27/2017 0705 11/30/2017 1625 Full Code [id number]  Radene Gunning, NP ED   10/01/2016 0033 10/01/2016 1946 Full Code [id number]  Maryellen Pile, MD ED   06/17/2016 1720 06/18/2016 2022 Full Code [id number]  Reyne Dumas, MD ED   04/23/2016 0418 04/26/2016 1716 Full Code [id number]  Rise Patience, MD ED   Advance Care Planning Activity        IV Access:    Peripheral IV   Procedures and diagnostic studies:   CT Angio Head W/Cm &/Or Wo Cm  Result Date: 03/18/2020 CLINICAL DATA:  Intractable headache. EXAM: CT ANGIOGRAPHY HEAD AND NECK TECHNIQUE: Multidetector CT imaging of the head and neck was performed using the standard protocol during bolus administration of intravenous contrast. Multiplanar CT image reconstructions and MIPs were obtained to evaluate the vascular anatomy. Carotid stenosis measurements (when applicable) are obtained utilizing NASCET criteria, using the distal internal carotid diameter as the denominator. CONTRAST:  86mL OMNIPAQUE IOHEXOL 350 MG/ML SOLN COMPARISON:  None. FINDINGS: CT HEAD FINDINGS Brain: No evidence of acute infarction, hemorrhage, hydrocephalus, extra-axial collection or mass lesion/mass effect. Vascular: No hyperdense  vessel. Prominent calcified plaques in the bilateral carotid siphons. Skull: Normal. Negative for fracture or focal lesion. Sinuses: Bilateral mastoid effusion. Orbits: Right lens surgery.  No acute findings. Review of the MIP images confirms the above findings CTA NECK FINDINGS Aortic arch: Common origin of the innominate and left common carotid artery from the aortic arch. Imaged portion shows no evidence of aneurysm or dissection. Calcified plaques are noted in the aortic arch and at the origin of the left subclavian artery without significant stenosis. Right carotid system: Calcified atherosclerotic plaque in the right carotid bifurcation without hemodynamically significant stenosis. There is increased tortuosity of the cervical segment of the right ICA. Left carotid system: Mild atherosclerotic plaques in the left common carotid artery and left carotid bifurcation without hemodynamically significant stenosis. Vertebral arteries: Codominant. No evidence of dissection, stenosis (50% or greater) or occlusion in the neck. Skeleton: Degenerative changes of the cervical spine. No aggressive bone lesion or acute finding. Other neck:  Mildly heterogeneous thyroid gland with a 6 mm partially calcified nodule in the left thyroid lobe. Upper chest: Bilateral ground-glass opacities, right greater than left. Multiple lymph nodes in the superior mediastinum with a precarinal lymph node measuring 11 mm. Bilateral pleural effusion, right greater than left. Review of the MIP images confirms the above findings CTA HEAD FINDINGS Anterior circulation: Calcified plaques in the bilateral carotid siphons with mild stenosis at the paraclinoid segment bilaterally. Hypoplastic right A1/ACA with dominant left A1/ACA segment. Two short segments of occlusion within and M4 segment of the left MCA posterior division branch (series 12, image 15). The bilateral ACA and MCA vascular trees are otherwise maintained. Posterior circulation:  Partially calcified atherosclerotic plaques in the intracranial right vertebral artery resulting in severe stenosis. The intracranial left vertebral artery is maintained. The basilar artery and posterior cerebral arteries have normal course and caliber. Venous sinuses: As permitted by contrast timing, patent. Anatomic variants: Hypoplastic right A1/ACA. Review of the MIP images confirms the above findings IMPRESSION: 1. No acute intracranial abnormality. 2. Two short segments of occlusion within and M4 segment of the left MCA posterior division branch. This may be secondary to intracranial atherosclerotic disease versus embolic. In case of high clinical suspicion for acute infarct, an MRI of the brain suggested. 3. Severe stenosis of the intracranial right vertebral artery. 4. No hemodynamically significant stenosis in the neck. 5. Bilateral pleural effusion, right greater than left. 6. Bilateral ground-glass opacity of the visualized lungs, mostly on the right side. 7. Aortic atherosclerosis. These results were called by telephone at the time of interpretation on 03/11/2020 at 3:31 pm to provider [facility] , who verbally acknowledged these results. Aortic Atherosclerosis (ICD10-I70.0). Electronically Signed   By: Pedro Earls M.D.   On: 04/03/2020 15:34   DG Chest 1 View  Result Date: 03/22/2020 CLINICAL DATA:  Pain following fall EXAM: CHEST  1 VIEW COMPARISON:  November 27, 2019 FINDINGS: There is airspace consolidation in the left base. There are small pleural effusions bilaterally. There is cardiomegaly with pulmonary vascularity normal. No adenopathy. No pneumothorax. There is aortic atherosclerosis. No bone lesions. IMPRESSION: Airspace opacity left lower lobe concerning for potential pneumonia. Small pleural effusions bilaterally. Cardiomegaly. Note that there may be a degree of underlying congestive heart failure. Electronically Signed   By: Lowella Grip III M.D.   On:  04/01/2020 10:26   DG Chest 2 View  Result Date: 03/18/2020 CLINICAL DATA:  64 year old female with acute on chronic respiratory failure and hypoxia EXAM: CHEST - 2 VIEW COMPARISON:  04/02/2020 FINDINGS: Cardiomediastinal silhouette unchanged. Low lung volumes persist. Opacity at the right lung base persists with partial obscuration of the right hemidiaphragm and thickening of the pleuroparenchymal interface. Opacity in the minor fissure persists. Air bronchograms persist at the left lung base. Meniscus on the lateral view. No pneumothorax. Coarsened interstitial markings. IMPRESSION: Low lung volumes and persistent opacity at the right lung base, a combination of partially loculated pleural fluid and atelectasis/consolidation. Left lower lobe lobar pneumonia not excluded given the suggestion of air bronchograms. Electronically Signed   By: Corrie Mckusick D.O.   On: 03/18/2020 08:16   CT Head Wo Contrast  Result Date: 03/26/2020 CLINICAL DATA:  Pain following fall.  Reported delirium EXAM: CT HEAD WITHOUT CONTRAST TECHNIQUE: Contiguous axial images were obtained from the base of the skull through the vertex without intravenous contrast. COMPARISON:  January 28, 2019 FINDINGS: Brain: There is mild diffuse atrophy. There is no intracranial mass, hemorrhage, extra-axial  fluid collection, or midline shift. Brain parenchyma appears unremarkable. No appreciable acute infarct. Vascular: No hyperdense vessel. There is calcification in each distal vertebral artery and carotid siphon region. Skull: Bony calvarium appears intact. Sinuses/Orbits: There is mucosal thickening in several ethmoid air cells. Other visualized paranasal sinuses are clear. Status post cataract removal on the right. Orbits otherwise appear symmetric bilaterally. Other: Opacification of multiple mastoid air cells noted. IMPRESSION: Atrophy with periventricular small vessel disease, stable. No mass or hemorrhage. Multiple foci of arterial  vascular calcification. Opacification of multiple mastoid air cells noted. Mucosal thickening in several ethmoid air cells. Electronically Signed   By: Lowella Grip III M.D.   On: 03/25/2020 10:33   CT Angio Neck W and/or Wo Contrast  Result Date: 03/24/2020 CLINICAL DATA:  Intractable headache. EXAM: CT ANGIOGRAPHY HEAD AND NECK TECHNIQUE: Multidetector CT imaging of the head and neck was performed using the standard protocol during bolus administration of intravenous contrast. Multiplanar CT image reconstructions and MIPs were obtained to evaluate the vascular anatomy. Carotid stenosis measurements (when applicable) are obtained utilizing NASCET criteria, using the distal internal carotid diameter as the denominator. CONTRAST:  69mL OMNIPAQUE IOHEXOL 350 MG/ML SOLN COMPARISON:  None. FINDINGS: CT HEAD FINDINGS Brain: No evidence of acute infarction, hemorrhage, hydrocephalus, extra-axial collection or mass lesion/mass effect. Vascular: No hyperdense vessel. Prominent calcified plaques in the bilateral carotid siphons. Skull: Normal. Negative for fracture or focal lesion. Sinuses: Bilateral mastoid effusion. Orbits: Right lens surgery.  No acute findings. Review of the MIP images confirms the above findings CTA NECK FINDINGS Aortic arch: Common origin of the innominate and left common carotid artery from the aortic arch. Imaged portion shows no evidence of aneurysm or dissection. Calcified plaques are noted in the aortic arch and at the origin of the left subclavian artery without significant stenosis. Right carotid system: Calcified atherosclerotic plaque in the right carotid bifurcation without hemodynamically significant stenosis. There is increased tortuosity of the cervical segment of the right ICA. Left carotid system: Mild atherosclerotic plaques in the left common carotid artery and left carotid bifurcation without hemodynamically significant stenosis. Vertebral arteries: Codominant. No evidence  of dissection, stenosis (50% or greater) or occlusion in the neck. Skeleton: Degenerative changes of the cervical spine. No aggressive bone lesion or acute finding. Other neck: Mildly heterogeneous thyroid gland with a 6 mm partially calcified nodule in the left thyroid lobe. Upper chest: Bilateral ground-glass opacities, right greater than left. Multiple lymph nodes in the superior mediastinum with a precarinal lymph node measuring 11 mm. Bilateral pleural effusion, right greater than left. Review of the MIP images confirms the above findings CTA HEAD FINDINGS Anterior circulation: Calcified plaques in the bilateral carotid siphons with mild stenosis at the paraclinoid segment bilaterally. Hypoplastic right A1/ACA with dominant left A1/ACA segment. Two short segments of occlusion within and M4 segment of the left MCA posterior division branch (series 12, image 15). The bilateral ACA and MCA vascular trees are otherwise maintained. Posterior circulation: Partially calcified atherosclerotic plaques in the intracranial right vertebral artery resulting in severe stenosis. The intracranial left vertebral artery is maintained. The basilar artery and posterior cerebral arteries have normal course and caliber. Venous sinuses: As permitted by contrast timing, patent. Anatomic variants: Hypoplastic right A1/ACA. Review of the MIP images confirms the above findings IMPRESSION: 1. No acute intracranial abnormality. 2. Two short segments of occlusion within and M4 segment of the left MCA posterior division branch. This may be secondary to intracranial atherosclerotic disease versus embolic. In case of  high clinical suspicion for acute infarct, an MRI of the brain suggested. 3. Severe stenosis of the intracranial right vertebral artery. 4. No hemodynamically significant stenosis in the neck. 5. Bilateral pleural effusion, right greater than left. 6. Bilateral ground-glass opacity of the visualized lungs, mostly on the right  side. 7. Aortic atherosclerosis. These results were called by telephone at the time of interpretation on 03/27/2020 at 3:31 pm to provider [facility] , who verbally acknowledged these results. Aortic Atherosclerosis (ICD10-I70.0). Electronically Signed   By: Pedro Earls M.D.   On: 03/27/2020 15:34   MR BRAIN WO CONTRAST  Result Date: 03/18/2020 CLINICAL DATA:  64 year old female with severe headache. Distal right vertebral artery severe stenosis and abnormal distal left MCA branches on recent CTA. EXAM: MRI HEAD WITHOUT CONTRAST TECHNIQUE: Multiplanar, multiecho pulse sequences of the brain and surrounding structures were obtained without intravenous contrast. COMPARISON:  CT head, CTA head and neck 03/09/2020 FINDINGS: Brain: Mild to moderate gyriform restricted diffusion in the posterior left MCA territory, lateral postcentral parietal lobe cortex on series 5, image 83, and also left posterior insula (image 77). No other restricted diffusion. Some T2 shine through suspected in the splenium. T2 and FLAIR hyperintense mild cytotoxic edema at the restricted areas. No associated acute hemorrhage or mass effect. Patchy and confluent superimposed bilateral cerebral white matter T2 and FLAIR hyperintensity, including some direct involvement and thinning of the corpus callosum. Chronic microhemorrhage in the right frontal lobe subcortical white matter on series 14, image 35. Moderate similar T2 and FLAIR hyperintensity in the pons. Deep gray nuclei and cerebellum remain within normal limits. No midline shift, mass effect, evidence of mass lesion, ventriculomegaly, extra-axial collection or acute intracranial hemorrhage. Cervicomedullary junction and pituitary are within normal limits. Vascular: Major intracranial vascular flow voids are preserved. Skull and upper cervical spine: Multilevel cervical spine degeneration, up to mild degenerative spinal stenosis. Visualized bone marrow signal is  within normal limits. Sinuses/Orbits: Postoperative changes to the right globe. Negative paranasal sinuses. Other: Bilateral mastoid air cell effusions. Negative nasopharynx. Other visible internal auditory structures appear grossly normal. IMPRESSION: 1. Small, patchy acute infarcts in posterior Left MCA territory cortex. No associated hemorrhage or mass effect. 2. Underlying advanced signal changes in the cerebral white matter and pons, nonspecific but most commonly due to chronic small vessel disease. Solitary chronic microhemorrhage in the right frontal lobe. 3. Bilateral mastoid air cell effusions, probably postinflammatory. Electronically Signed   By: Genevie Ann M.D.   On: 03/18/2020 06:37   DG Knee Complete 4 Views Right  Result Date: 03/27/2020 CLINICAL DATA:  Pain following fall EXAM: RIGHT KNEE - COMPLETE 4+ VIEW COMPARISON:  None. FINDINGS: Frontal, lateral, and bilateral oblique views were obtained. There is no fracture, dislocation, or joint effusion. There is spurring in all compartments. There is mild narrowing medially. No erosion. IMPRESSION: Areas of osteoarthritic change. No appreciable fracture, dislocation, or joint effusion. Electronically Signed   By: Lowella Grip III M.D.   On: 03/25/2020 10:28   DG HIP UNILAT WITH PELVIS 2-3 VIEWS RIGHT  Result Date: 03/16/2020 CLINICAL DATA:  Pain following fall EXAM: DG HIP (WITH OR WITHOUT PELVIS) 2-3V RIGHT COMPARISON:  None. FINDINGS: Frontal pelvis as well as frontal and lateral right hip images were obtained. There is no fracture or dislocation. There is moderate symmetric narrowing of each hip joint. No erosive change. IMPRESSION: No acute fracture or dislocation. Moderate symmetric narrowing each hip joint. Electronically Signed   By: Lowella Grip III  M.D.   On: 03/16/2020 10:26     Medical Consultants:    None.  Anti-Infectives:   1 dose of Rocephin and azithromycin  Subjective:    Sumire Halbleib  aphasic  Objective:    Vitals:   03/18/20 0440 03/18/20 0600 03/18/20 0732 03/18/20 0800  BP: 99/65  104/61   Pulse: 83  (!) 113   Resp: 16  18   Temp: 98.1 F (36.7 C)  98 F (36.7 C)   TempSrc: Oral  Oral   SpO2: 100% 93% 100% 100%  Weight:      Height:       SpO2: 100 % O2 Flow Rate (L/min): 6 L/min   Intake/Output Summary (Last 24 hours) at 03/18/2020 0848 Last data filed at 03/18/2020 0634 Gross per 24 hour  Intake 350 ml  Output 200 ml  Net 150 ml   Filed Weights   03/18/20 0100  Weight: 65.8 kg    Exam: General exam: In no acute distress. Respiratory system: Good air movement and clear to auscultation. Cardiovascular system: S1 & S2 heard, RRR. No JVD. Gastrointestinal system: Abdomen is nondistended, soft and nontender.  Central nervous system: She is awake alert and oriented x2 aphasic: Speech. Extremities: No edema  Data Reviewed:    Labs: Basic Metabolic Panel: Recent Labs  Lab 03/11/2020 0926 03/16/2020 1458 03/18/20 0311  NA 141  --  139  K 3.2*  --  4.3  CL 100  --  102  CO2 25  --  24  GLUCOSE 103*  --  121*  BUN 35*  --  38*  CREATININE 1.82*  --  2.06*  CALCIUM 9.3  --  8.9  MG  --  1.8 2.3   GFR Estimated Creatinine Clearance: 22.4 mL/min (A) (by C-G formula based on SCr of 2.06 mg/dL (H)). Liver Function Tests: Recent Labs  Lab 03/30/2020 0926  AST 10*  ALT 7  ALKPHOS 106  BILITOT 0.8  PROT 5.7*  ALBUMIN 2.6*   Recent Labs  Lab 04/01/2020 0926  LIPASE 15   No results for input(s): AMMONIA in the last 168 hours. Coagulation profile No results for input(s): INR, PROTIME in the last 168 hours. COVID-19 Labs  No results for input(s): DDIMER, FERRITIN, LDH, CRP in the last 72 hours.  Lab Results  Component Value Date   SARSCOV2NAA NEGATIVE 03/26/2020   SARSCOV2NAA NEGATIVE 11/27/2019   SARSCOV2NAA NEGATIVE 11/19/2019   [place] NEGATIVE 09/26/2019    CBC: Recent Labs  Lab 03/05/2020 0926 03/18/20 0311  WBC 6.0  6.3  NEUTROABS 4.9  --   HGB 11.7* 11.3*  HCT 39.7 37.3  MCV 90.2 88.8  PLT 266 218   Cardiac Enzymes: No results for input(s): CKTOTAL, CKMB, CKMBINDEX, TROPONINI in the last 168 hours. BNP (last 3 results) No results for input(s): PROBNP in the last 8760 hours. CBG: Recent Labs  Lab 03/29/2020 1706  GLUCAP 85   D-Dimer: No results for input(s): DDIMER in the last 72 hours. Hgb A1c: Recent Labs    03/18/20 0729  HGBA1C 6.8*   Lipid Profile: Recent Labs    03/18/20 0729  CHOL 180  HDL 34*  LDLCALC 125*  TRIG 104  CHOLHDL 5.3   Thyroid function studies: Recent Labs    03/18/20 0729  TSH 3.311   Anemia work up: No results for input(s): VITAMINB12, FOLATE, FERRITIN, TIBC, IRON, RETICCTPCT in the last 72 hours. Sepsis Labs: Recent Labs  Lab 03/04/2020 0926 03/16/2020 1458 03/18/20 8850  PROCALCITON  --  <0.10 <0.10  WBC 6.0  --  6.3   Microbiology Recent Results (from the past 240 hour(s))  Urine culture     Status: None   Collection Time: 03/18/2020  9:51 AM   Specimen: Urine, Random  Result Value Ref Range Status   Specimen Description URINE, RANDOM  Final   Special Requests NONE  Final   Culture   Final    NO GROWTH Performed at [facility], [address] [place], [place] [address]    Report Status 03/18/2020 FINAL  Final  Resp Panel by RT-PCR (Flu A&B, Covid) Nasopharyngeal Swab     Status: None   Collection Time: 03/12/2020 10:34 AM   Specimen: Nasopharyngeal Swab; Nasopharyngeal(NP) swabs in vial transport medium  Result Value Ref Range Status   SARS Coronavirus 2 by RT PCR NEGATIVE NEGATIVE Final    Comment: (NOTE) SARS-CoV-2 target nucleic acids are NOT DETECTED.  The SARS-CoV-2 RNA is generally detectable in upper respiratory specimens during the acute phase of infection. The lowest concentration of SARS-CoV-2 viral copies this assay can detect is 138 copies/mL. A negative result does not preclude SARS-Cov-2 infection and should not be  used as the sole basis for treatment or other patient management decisions. A negative result may occur with  improper specimen collection/handling, submission of specimen other than nasopharyngeal swab, presence of viral mutation(s) within the areas targeted by this assay, and inadequate number of viral copies(<138 copies/mL). A negative result must be combined with clinical observations, patient history, and epidemiological information. The expected result is Negative.  Fact Sheet for Patients:  EntrepreneurPulse.com.au  Fact Sheet for Healthcare Providers:  IncredibleEmployment.be  This test is no t yet approved or cleared by the [place] FDA and  has been authorized for detection and/or diagnosis of SARS-CoV-2 by FDA under an Emergency Use Authorization (EUA). This EUA will remain  in effect (meaning this test can be used) for the duration of the COVID-19 declaration under Section 564(b)(1) of the Act, 21 U.S.C.section 360bbb-3(b)(1), unless the authorization is terminated  or revoked sooner.       Influenza A by PCR NEGATIVE NEGATIVE Final   Influenza B by PCR NEGATIVE NEGATIVE Final    Comment: (NOTE) The Xpert Xpress SARS-CoV-2/FLU/RSV plus assay is intended as an aid in the diagnosis of influenza from Nasopharyngeal swab specimens and should not be used as a sole basis for treatment. Nasal washings and aspirates are unacceptable for Xpert Xpress SARS-CoV-2/FLU/RSV testing.  Fact Sheet for Patients: EntrepreneurPulse.com.au  Fact Sheet for Healthcare Providers: IncredibleEmployment.be  This test is not yet approved or cleared by the [place] FDA and has been authorized for detection and/or diagnosis of SARS-CoV-2 by FDA under an Emergency Use Authorization (EUA). This EUA will remain in effect (meaning this test can be used) for the duration of the COVID-19 declaration under Section  564(b)(1) of the Act, 21 U.S.C. section 360bbb-3(b)(1), unless the authorization is terminated or revoked.  Performed at [facility], [place] [address] [place], [place] [address]      Medications:   . aspirin  325 mg Oral Daily  . azithromycin  500 mg Oral Daily  . furosemide  60 mg Intravenous Q12H  . gabapentin  200 mg Oral BID  . heparin  5,000 Units Subcutaneous Q8H  . hydrALAZINE  25 mg Oral TID   Continuous Infusions: . cefTRIAXone (ROCEPHIN)  IV 2 g (03/18/20 0135)      LOS: 1 day  Charlynne Cousins  [facility]  03/18/2020, 8:48 AM

## 2020-03-18 NOTE — ED Notes (Signed)
Report given to Lynn, RN

## 2020-03-18 NOTE — Evaluation (Signed)
Speech Language Pathology Evaluation Patient Details Name: Kara Hanson MRN: [id number] DOB: 28-Dec-1955 Today's Date: 03/18/2020 Time: 4098-1191 SLP Time Calculation (min) (ACUTE ONLY): 14 min  Problem List:  Patient Active Problem List   Diagnosis Date Noted  . Headache 03/31/2020  . Middle cerebral artery stenosis, left 03/24/2020  . DNR (do not resuscitate)   . DNI (do not intubate)   . Advanced directives, counseling/discussion   . Shortness of breath   . Abdominal pain 11/19/2019  . CKD (chronic kidney disease), stage IV ([place]) 11/19/2019  . Palliative care by specialist   . Goals of care, counseling/discussion   . Cardiorenal syndrome   . Hypokalemia 09/26/2019  . CHF exacerbation ([place]) 09/26/2019  . Acute on chronic systolic CHF (congestive heart failure) ([place]) 05/19/2019  . Elevated troponin 04/18/2019  . NSTEMI (non-ST elevated myocardial infarction) ([place]) 02/09/2019  . S/P thoracentesis   . Atelectasis   . Chronic respiratory failure ([place])   . CAP (community acquired pneumonia) 10/31/2018  . Pneumonia due to COVID-19 virus 07/20/2018  . Vitamin D deficiency 04/02/2018  . Spinal stenosis 03/29/2018  . Acute on chronic respiratory failure with hypoxia ([place]) 03/20/2018  . Controlled type 2 diabetes with neuropathy ([place]) 03/20/2018  . Acute on chronic combined systolic and diastolic CHF (congestive heart failure) ([place]) 11/27/2017  . AKI (acute kidney injury) ([place]) 11/27/2017  . Acute respiratory distress 11/27/2017  . Bulging lumbar disc 11/14/2017  . Gout 11/14/2017  . Hyperlipidemia LDL goal <70 09/30/2016  . Chest pain 06/17/2016  . Stress-induced cardiomyopathy 05/19/2016  . Type 2 diabetes mellitus with diabetic neuropathy, unspecified ([place]) 05/06/2016  . Vertigo 05/06/2016  . Controlled type 2 diabetes mellitus with hyperglycemia ([place]) 04/23/2016  . Hypertension 04/23/2016  . Normochromic normocytic anemia 04/23/2016  . Syncope 04/23/2016   Past Medical  History:  Past Medical History:  Diagnosis Date  . Acute CHF (congestive heart failure) ([place]) 09/24/2018  . Acute on chronic combined systolic and diastolic CHF (congestive heart failure) ([place]) 11/27/2017  . Acute on chronic respiratory failure with hypoxia ([place]) 03/20/2018  . Acute renal failure superimposed on stage 3 chronic kidney disease ([place])   . Acute respiratory distress 11/27/2017  . Acute respiratory failure with hypoxia ([place]) 02/09/2019  . Acute systolic CHF (congestive heart failure) ([place]) 05/19/2019  . AKI (acute kidney injury) ([place]) 11/27/2017  . Atelectasis   . Bulging lumbar disc 11/14/2017  . CAP (community acquired pneumonia) 10/31/2018  . Chest pain 06/17/2016  . CHF (congestive heart failure) ([place])   . Chronic diastolic CHF (congestive heart failure) ([place]) 07/20/2018  . Chronic respiratory failure ([place])   . CKD (chronic kidney disease), stage III ([place]) 03/20/2018  . Controlled type 2 diabetes mellitus with hyperglycemia ([place]) 04/23/2016  . Controlled type 2 diabetes with neuropathy ([place]) 03/20/2018  . COVID-19 virus infection 07/20/2018  . CVA (cerebral vascular accident) ([place])   . Diabetes mellitus without complication ([place])   . Dyspnea   . Elevated troponin 04/18/2019  . Epigastric pain   . Gout 11/14/2017  . Hypercholesteremia   . Hyperlipidemia LDL goal <70 09/30/2016  . Hypertension   . Myocardial infarction ([place])   . Normochromic normocytic anemia 04/23/2016  . NSTEMI (non-ST elevated myocardial infarction) ([place]) 02/09/2019  . Pneumonia 11/01/2018  . Pneumonia due to COVID-19 virus 07/20/2018  . S/P thoracentesis   . SIRS (systemic inflammatory response syndrome) ([place]) 07/25/2018  . Spinal stenosis   . Stress-induced cardiomyopathy 05/19/2016  . Syncope 04/23/2016  . Type 2  diabetes mellitus with diabetic neuropathy, unspecified ([place]) 05/06/2016  . Vertigo 05/06/2016  . Vitamin D deficiency 04/02/2018   Past Surgical History:  Past Surgical History:  Procedure  Laterality Date  . BIOPSY  01/27/2019   Procedure: BIOPSY;  Surgeon: Otis Brace, MD;  Location: [facility];  Service: Gastroenterology;;  . BLADDER SURGERY    . CARDIAC CATHETERIZATION N/A 04/25/2016   Procedure: Left Heart Cath and Coronary Angiography;  Surgeon: Lorretta Harp, MD;  Location: [facility];  Service: Cardiovascular;  Laterality: N/A;  . CARDIAC CATHETERIZATION  02/11/2019  . CESAREAN SECTION    . CHOLECYSTECTOMY    . ESOPHAGOGASTRODUODENOSCOPY (EGD) WITH PROPOFOL N/A 01/27/2019   Procedure: ESOPHAGOGASTRODUODENOSCOPY (EGD) WITH PROPOFOL;  Surgeon: Otis Brace, MD;  [facility]: [facility];  Service: Gastroenterology;  Laterality: N/A;  . RIGHT HEART CATH N/A 09/30/2019   Procedure: RIGHT HEART CATH;  Surgeon: Jolaine Artist, MD;  Location: [facility];  Service: Cardiovascular;  Laterality: N/A;  . RIGHT/LEFT HEART CATH AND CORONARY ANGIOGRAPHY N/A 02/11/2019   Procedure: RIGHT/LEFT HEART CATH AND CORONARY ANGIOGRAPHY;  Surgeon: Jolaine Artist, MD;  Location: [facility];  Service: Cardiovascular;  Laterality: N/A;  . VIDEO BRONCHOSCOPY N/A 02/01/2019   Procedure: VIDEO BRONCHOSCOPY WITHOUT FLUORO;  Surgeon: Candee Furbish, MD;  Location: [facility];  Service: Endoscopy;  Laterality: N/A;   HPI:  Pt is a 64 yo female presenting with SOB, headaches, and falls. On admission she was hypoxic (SpO2 in the 60s) and CXR was notable for opacity in the L base concerning for PNA. MRI revealed small acute infarcts in the posterior L MCA territory. There was also evidence of more chronic small vessel disease in the pons. Previous swallowing evaluation in October 2020 recommended Dys 3 diet and thin liquids with concern for esophageal issues. PMH includes: vertigo, DM, syncope, PNA, NSTEMI, HTN, HLD, , dyspnea, CVA, CKD, CHF (on home hospice)   Assessment / Plan / Recommendation Clinical Impression  Pt has an expressive more than receptive  aphasia, with verbal expression marked primarily by phonemic paraphasias and perseverative errors. She does need Min-Mod visual cues for command following, mostly when trying to break motoric perseverations. She was able to increase her accuracy during confrontational naming trials when given both sentence completion and phonemic cues, although there are still some sound distortions. She is fairly accurate with her yes/no responses, responding with 90% accuracy to simple questions but wtih accuracy decreasing to 60-70% when becoming more complex. Pt will benefit from SLP f/u acutely and at next level of care to increase functional communication.    SLP Assessment  SLP Recommendation/Assessment: Patient needs continued Speech Lanaguage Pathology Services SLP Visit Diagnosis: Aphasia (R47.01)    Follow Up Recommendations   (SLP at next level of care)    Frequency and Duration min 2x/week  2 weeks      SLP Evaluation Cognition  Overall Cognitive Status: Difficult to assess (aphasia) Arousal/Alertness: Awake/alert Orientation Level: Oriented to person Awareness:  (demonstrating some emergent awareness of language deficits)       Comprehension  Auditory Comprehension Overall Auditory Comprehension: Impaired Yes/No Questions: Impaired Basic Biographical Questions: 76-100% accurate Basic Immediate Environment Questions: 75-100% accurate Complex Questions: 50-74% accurate Commands: Impaired One Step Basic Commands: 50-74% accurate    Expression Expression Primary Mode of Expression: Verbal Verbal Expression Overall Verbal Expression: Impaired Initiation: No impairment Automatic Speech: Name;Counting (ut with cues) Level of Generative/Spontaneous Verbalization: Word;Phrase (but paraphasic) Repetition:  (repeats some  at the word level) Naming: Impairment Confrontation: Impaired Verbal Errors: Semantic paraphasias;Phonemic paraphasias;Aware of errors Effective Techniques: Sentence  completion;Phonemic cues Non-Verbal Means of Communication: Not applicable   Oral / Motor  Oral Motor/Sensory Function Overall Oral Motor/Sensory Function: Other (comment) (inconsistently following commands but with R facial droop noted) Motor Speech Overall Motor Speech:  (? component of motor planning)   GO                    Osie Bond., M.A. [facility] Pager [phone] Office [phone]  03/18/2020, 10:40 AM

## 2020-03-18 NOTE — Progress Notes (Signed)
Bilateral lower extremity venous duplex has been completed. Preliminary results can be found in CV Proc through chart review.   03/18/20 2:45 PM [name] Part

## 2020-03-18 NOTE — Evaluation (Signed)
Clinical/Bedside Swallow Evaluation Patient Details  Name: Kara Hanson MRN: [id number] Date of Birth: February 04, 1956  Today's Date: 03/18/2020 Time: SLP Start Time (ACUTE ONLY): 0954 SLP Stop Time (ACUTE ONLY): 1005 SLP Time Calculation (min) (ACUTE ONLY): 11 min  Past Medical History:  Past Medical History:  Diagnosis Date  . Acute CHF (congestive heart failure) ([place]) 09/24/2018  . Acute on chronic combined systolic and diastolic CHF (congestive heart failure) ([place]) 11/27/2017  . Acute on chronic respiratory failure with hypoxia ([place]) 03/20/2018  . Acute renal failure superimposed on stage 3 chronic kidney disease ([place])   . Acute respiratory distress 11/27/2017  . Acute respiratory failure with hypoxia ([place]) 02/09/2019  . Acute systolic CHF (congestive heart failure) ([place]) 05/19/2019  . AKI (acute kidney injury) ([place]) 11/27/2017  . Atelectasis   . Bulging lumbar disc 11/14/2017  . CAP (community acquired pneumonia) 10/31/2018  . Chest pain 06/17/2016  . CHF (congestive heart failure) ([place])   . Chronic diastolic CHF (congestive heart failure) ([place]) 07/20/2018  . Chronic respiratory failure ([place])   . CKD (chronic kidney disease), stage III ([place]) 03/20/2018  . Controlled type 2 diabetes mellitus with hyperglycemia ([place]) 04/23/2016  . Controlled type 2 diabetes with neuropathy ([place]) 03/20/2018  . COVID-19 virus infection 07/20/2018  . CVA (cerebral vascular accident) ([place])   . Diabetes mellitus without complication ([place])   . Dyspnea   . Elevated troponin 04/18/2019  . Epigastric pain   . Gout 11/14/2017  . Hypercholesteremia   . Hyperlipidemia LDL goal <70 09/30/2016  . Hypertension   . Myocardial infarction ([place])   . Normochromic normocytic anemia 04/23/2016  . NSTEMI (non-ST elevated myocardial infarction) ([place]) 02/09/2019  . Pneumonia 11/01/2018  . Pneumonia due to COVID-19 virus 07/20/2018  . S/P thoracentesis   . SIRS (systemic inflammatory response syndrome) ([place]) 07/25/2018  . Spinal  stenosis   . Stress-induced cardiomyopathy 05/19/2016  . Syncope 04/23/2016  . Type 2 diabetes mellitus with diabetic neuropathy, unspecified ([place]) 05/06/2016  . Vertigo 05/06/2016  . Vitamin D deficiency 04/02/2018   Past Surgical History:  Past Surgical History:  Procedure Laterality Date  . BIOPSY  01/27/2019   Procedure: BIOPSY;  Surgeon: Otis Brace, MD;  [facility];  Service: Gastroenterology;;  . BLADDER SURGERY    . CARDIAC CATHETERIZATION N/A 04/25/2016   Procedure: Left Heart Cath and Coronary Angiography;  Surgeon: Lorretta Harp, MD;  Location: [facility];  Service: Cardiovascular;  Laterality: N/A;  . CARDIAC CATHETERIZATION  02/11/2019  . CESAREAN SECTION    . CHOLECYSTECTOMY    . ESOPHAGOGASTRODUODENOSCOPY (EGD) WITH PROPOFOL N/A 01/27/2019   Procedure: ESOPHAGOGASTRODUODENOSCOPY (EGD) WITH PROPOFOL;  Surgeon: Otis Brace, MD;  [facility];  Service: Gastroenterology;  Laterality: N/A;  . RIGHT HEART CATH N/A 09/30/2019   Procedure: RIGHT HEART CATH;  Surgeon: Jolaine Artist, MD;  Location: [facility];  Service: Cardiovascular;  Laterality: N/A;  . RIGHT/LEFT HEART CATH AND CORONARY ANGIOGRAPHY N/A 02/11/2019   Procedure: RIGHT/LEFT HEART CATH AND CORONARY ANGIOGRAPHY;  Surgeon: Jolaine Artist, MD;  Location: [facility];  Service: Cardiovascular;  Laterality: N/A;  . VIDEO BRONCHOSCOPY N/A 02/01/2019   Procedure: VIDEO BRONCHOSCOPY WITHOUT FLUORO;  Surgeon: Candee Furbish, MD;  [facility];  Service: Endoscopy;  Laterality: N/A;   HPI:  Pt is a 63 yo female presenting with SOB, headaches, and falls. On admission she was hypoxic (SpO2 in the 60s) and CXR was notable for opacity in the  L base concerning for PNA. MRI revealed small acute infarcts in the posterior L MCA territory. There was also evidence of more chronic small vessel disease in the pons. Previous swallowing evaluation in October 2020  recommended Dys 3 diet and thin liquids with concern for esophageal issues. PMH includes: vertigo, DM, syncope, PNA, NSTEMI, HTN, HLD, , dyspnea, CVA, CKD, CHF (on home hospice)   Assessment / Plan / Recommendation Clinical Impression  Pt presents with signs of a possible dysphagia, although with expressive aphasia it is also a little challenging to discern the etiology of her presenting symptoms. She also has a mild, baseline cough that confounds testing results. Pt has swift appearing swallowing when given sips of thin liquids, even consecutively via cup or straw. When she tries anything solid, regardless of texture, flavor, or temperature, she has facial grimacing, intermittent expectoration, and at times appears to be gagging. Given that she reportedly vomited when given meds after the [facility] swallow screen (per flowsheet documentation), as well as concern for esophageal issues from prior swallowing evaluation, question if this could be an ongoing esophageal issue vs a more acute issue such as nausea or an oropharyngeal dysphagia. For today would hold POs except for a few small sips of water at a time after oral care. SLP will f/u to see if MBS is indicated. SLP Visit Diagnosis: Dysphagia, unspecified (R13.10)    Aspiration Risk  Mild aspiration risk;Moderate aspiration risk    Diet Recommendation NPO;Other (Comment) (few sips of water after oral care)   Liquid Administration via: Cup;Straw Medication Administration: Via alternative means    Other  Recommendations Oral Care Recommendations: Oral care QID   Follow up Recommendations  (tba)      Frequency and Duration min 2x/week  2 weeks       Prognosis Prognosis for Safe Diet Advancement: Good Barriers to Reach Goals: Language deficits      Swallow Study   General HPI: Pt is a 64 yo female presenting with SOB, headaches, and falls. On admission she was hypoxic (SpO2 in the 60s) and CXR was notable for opacity in the L base concerning  for PNA. MRI revealed small acute infarcts in the posterior L MCA territory. There was also evidence of more chronic small vessel disease in the pons. Previous swallowing evaluation in October 2020 recommended Dys 3 diet and thin liquids with concern for esophageal issues. PMH includes: vertigo, DM, syncope, PNA, NSTEMI, HTN, HLD, , dyspnea, CVA, CKD, CHF (on home hospice) Type of Study: Bedside Swallow Evaluation Previous Swallow Assessment: see HPI Diet Prior to this Study: NPO Temperature Spikes Noted: No Respiratory Status: Nasal cannula History of Recent Intubation: No Behavior/Cognition: Alert;Cooperative Oral Cavity Assessment: Within Functional Limits Oral Care Completed by SLP: No Oral Cavity - Dentition: Adequate natural dentition Patient Positioning: Upright in bed Baseline Vocal Quality: Normal Volitional Swallow: Able to elicit (with cues)    Oral/Motor/Sensory Function Overall Oral Motor/Sensory Function:  (inconsistently following commands but with R facial droop noted)   Ice Chips Ice chips: Not tested   Thin Liquid Thin Liquid: Within functional limits Presentation: Cup;Self Fed;Straw    Nectar Thick Nectar Thick Liquid: Not tested   Honey Thick Honey Thick Liquid: Not tested   Puree Puree: Impaired Presentation: Spoon Oral Phase Functional Implications: Oral holding Pharyngeal Phase Impairments: Cough - Delayed   Solid     Solid: Impaired Presentation:  (fork) Oral Phase Impairments: Impaired mastication Oral Phase Functional Implications: Other (comment) (expectorated bolus) Pharyngeal Phase Impairments:  (  not observed)      Osie Bond., M.A. [facility] Pager [phone] Office [phone]  03/18/2020,10:32 AM

## 2020-03-18 NOTE — Progress Notes (Signed)
0730 Received pt, drowsy but easily arousable, follows commands, aphasic.  Seen by MD.

## 2020-03-18 NOTE — Progress Notes (Signed)
  Echocardiogram 2D Echocardiogram has been performed.  Kara Hanson 03/18/2020, 3:01 PM

## 2020-03-18 NOTE — Progress Notes (Signed)
Pt more alert able to respond faster, she said her name, place, birthdate. She even stated "where is bathroom, need to poo".  She was able to raise and feel something on  both Upper Extremities.see neuro assessment. Will monitor.

## 2020-03-19 DIAGNOSIS — Z515 Encounter for palliative care: Secondary | ICD-10-CM

## 2020-03-19 DIAGNOSIS — I639 Cerebral infarction, unspecified: Secondary | ICD-10-CM

## 2020-03-19 DIAGNOSIS — I5043 Acute on chronic combined systolic (congestive) and diastolic (congestive) heart failure: Secondary | ICD-10-CM

## 2020-03-19 DIAGNOSIS — Z7189 Other specified counseling: Secondary | ICD-10-CM

## 2020-03-19 LAB — CBC
HCT: 37.5 % (ref 36.0–46.0)
Hemoglobin: 11.3 g/dL — ABNORMAL LOW (ref 12.0–15.0)
MCH: 26.9 pg (ref 26.0–34.0)
MCHC: 30.1 g/dL (ref 30.0–36.0)
MCV: 89.3 fL (ref 80.0–100.0)
Platelets: 228 10*3/uL (ref 150–400)
RBC: 4.2 MIL/uL (ref 3.87–5.11)
RDW: 16.8 % — ABNORMAL HIGH (ref 11.5–15.5)
WBC: 5.1 10*3/uL (ref 4.0–10.5)
nRBC: 0 % (ref 0.0–0.2)

## 2020-03-19 LAB — MAGNESIUM: Magnesium: 2.1 mg/dL (ref 1.7–2.4)

## 2020-03-19 LAB — BASIC METABOLIC PANEL
Anion gap: 11 (ref 5–15)
BUN: 37 mg/dL — ABNORMAL HIGH (ref 8–23)
CO2: 26 mmol/L (ref 22–32)
Calcium: 8.9 mg/dL (ref 8.9–10.3)
Chloride: 102 mmol/L (ref 98–111)
Creatinine, Ser: 2.54 mg/dL — ABNORMAL HIGH (ref 0.44–1.00)
GFR, Estimated: 20 mL/min — ABNORMAL LOW (ref >60)
Glucose, Bld: 73 mg/dL (ref 70–99)
Potassium: 4 mmol/L (ref 3.5–5.1)
Sodium: 139 mmol/L (ref 135–145)

## 2020-03-19 LAB — PROCALCITONIN: Procalcitonin: 0.16 ng/mL

## 2020-03-19 MED ORDER — TORSEMIDE 20 MG PO TABS
40.0000 mg | ORAL_TABLET | Freq: Two times a day (BID) | ORAL | Status: DC
  Administered 2020-03-19 (×2): 40 mg via ORAL
  Filled 2020-03-19 (×2): qty 2

## 2020-03-19 NOTE — Progress Notes (Signed)
  Speech Language Pathology Treatment: Dysphagia  Patient Details Name: Kara Hanson MRN: [id number] DOB: 11-24-1955 Today's Date: 03/19/2020 Time: 8413-2440 SLP Time Calculation (min) (ACUTE ONLY): 19.12 min  Assessment / Plan / Recommendation Clinical Impression  Pt with improved interaction and communication today.  There were no episodes of perseveration. Responses were somewhat delayed, but accuracy of output was improved, and when the physician was talking to her about her meds, she responded, "I will take my medications."  She was oriented to person/place.  She needed physical assistance with feeding, but swallowing was much improved with active mastication of crackers, no oral residue post-swallow, only occasional grimacing, and no s/s of aspiration. When questioned about the grimacing when swallowing, today she was able state that it was because she did not like water and applesauce.    Recommend advancing diet to regular solids, thin liquids; give meds with liquids.  She will need assistance with setting up her tray and with some feeding.    SLP will continue to follow for aphasia, but swallowing goal has been met.   HPI HPI: Pt is a 64 yo female presenting with SOB, headaches, and falls. On admission she was hypoxic (SpO2 in the 60s) and CXR was notable for opacity in the L base concerning for PNA. MRI revealed small acute infarcts in the posterior L MCA territory. There was also evidence of more chronic small vessel disease in the pons. Previous swallowing evaluation in October 2020 recommended Dys 3 diet and thin liquids with concern for esophageal issues. PMH includes: vertigo, DM, syncope, PNA, NSTEMI, HTN, HLD, , dyspnea, CVA, CKD, CHF (on home hospice)      SLP Plan  Continue with current plan of care for aphasia. Swallow goal met.       Recommendations  Diet recommendations: Regular;Thin liquid Liquids provided via: Cup;Straw Medication Administration: Whole meds with  liquid Supervision: Patient able to self feed;Staff to assist with self feeding Postural Changes and/or Swallow Maneuvers: Seated upright 90 degrees                Oral Care Recommendations: Oral care BID Follow up Recommendations:  (SLP next level of care) SLP Visit Diagnosis: Dysphagia, unspecified (R13.10) Plan: Continue with current plan of care       GO                Juan Quam Laurice 03/19/2020, 8:59 AM  Estill Bamberg L. Tivis Ringer, [place] Office number [phone] Pager [phone]

## 2020-03-19 NOTE — Consult Note (Addendum)
Consultation Note Date: 03/19/2020   Patient Name: Kara Hanson  DOB: Feb 23, 1956  MRN: [id number]  Age / Sex: 64 y.o., female  PCP: [name], MD Referring Physician: Aileen Fass, Tammi Klippel, MD  Reason for Consultation: Establishing goals of care  HPI/Patient Profile: 64 y.o. female  with past medical history of combined systolic and diastolic CHF, chronic 4L oixygen, CKD stage 4, nonadherence with her medications, anxiety admitted on 03/05/2020 with headache, falls, increased shortness shortness of breath. Found to have left MCA stroke. Appears to be returning to baseline.   Clinical Assessment and Goals of Care: I met today with Kara Hanson at bedside and no family present. She was resting comfortably and appears to be in good spirits. Her main complaint is that her legs feel weak but she is able to lift them off bed and move feet around freely. She is now on regular diet and taking medications whole with liquid and RN reports intake and pill taking have been good today with no concerns. Kara Hanson is able to respond verbally but responses are delayed but also appropriate. She could not tell me where she lives but when asked why she is here she says "I did not take my pills but I will take them now." She indicates desire to take medication that her providers feel are beneficial to her. She gives me permission to speak with her daughter, Kara Hanson.   I called and spoke with Kara Hanson. I explained concern for small stroke but that her mother's swallowing, intake, and communication appear much improved today vs yesterday. Kara Hanson has good understanding but does express frustration regarding her mother refusing medication and poor motivation to get out of bed and to do what she needs to do to feel better. Kara Hanson and I discussed how heart failure, kidney failure, and even potential effects from previous stroke or changes in  brain effecting her motivation and physical and mental abilities. I encouraged Kara Hanson that she is taking good care of her mother but we cannot always force others to do as they should. Kara Hanson does report a desire for her mother to return home with hospice care. She has mixed feelings on addition of other medications as she does not want to increase her mother's pill burden but she also wants her mother to have opportunity to avoid further complications and to live as long as possible. I have reached out to Dr. Margarita Rana as Kara Hanson valued her opinion and advice and will await if she can weigh in per family request.   All questions/concerns addressed. Emotional support provided.  Primary Decision Maker NEXT OF KIN daughter Kara Hanson    SUMMARY OF RECOMMENDATIONS   - DNR - Return home with hospice care - Minimize medications to ones that will have largest impact in short term while minimizing pill burden - Could consider some conservative antiplatelet/anticoagulant therapy but d/c lipitor - Hospice can always assist with pill de-escalation at home as well based on Chris's tolerance  Code Status/Advance Care Planning:  DNR   Symptom Management:   Per  attending.   Palliative Prophylaxis:   Delirium Protocol and Frequent Pain Assessment  Prognosis:   < 6 months  Discharge Planning: Home with Hospice      Primary Diagnoses: Present on Admission: . Acute on chronic combined systolic and diastolic CHF (congestive heart failure) ([place]) . Acute on chronic respiratory failure with hypoxia ([place]) . Type 2 diabetes mellitus with diabetic neuropathy, unspecified ([place]) . Hypertension . Hypokalemia . CKD (chronic kidney disease), stage IV ([place]) . Headache . Middle cerebral artery stenosis, left   I have reviewed the medical record, interviewed the patient and family, and examined the patient. The following aspects are pertinent.  Past Medical History:  Diagnosis Date  . Acute CHF  (congestive heart failure) ([place]) 09/24/2018  . Acute on chronic combined systolic and diastolic CHF (congestive heart failure) ([place]) 11/27/2017  . Acute on chronic respiratory failure with hypoxia ([place]) 03/20/2018  . Acute renal failure superimposed on stage 3 chronic kidney disease ([place])   . Acute respiratory distress 11/27/2017  . Acute respiratory failure with hypoxia ([place]) 02/09/2019  . Acute systolic CHF (congestive heart failure) ([place]) 05/19/2019  . AKI (acute kidney injury) ([place]) 11/27/2017  . Atelectasis   . Bulging lumbar disc 11/14/2017  . CAP (community acquired pneumonia) 10/31/2018  . Chest pain 06/17/2016  . CHF (congestive heart failure) ([place])   . Chronic diastolic CHF (congestive heart failure) ([place]) 07/20/2018  . Chronic respiratory failure ([place])   . CKD (chronic kidney disease), stage III ([place]) 03/20/2018  . Controlled type 2 diabetes mellitus with hyperglycemia ([place]) 04/23/2016  . Controlled type 2 diabetes with neuropathy ([place]) 03/20/2018  . COVID-19 virus infection 07/20/2018  . CVA (cerebral vascular accident) ([place])   . Diabetes mellitus without complication ([place])   . Dyspnea   . Elevated troponin 04/18/2019  . Epigastric pain   . Gout 11/14/2017  . Hypercholesteremia   . Hyperlipidemia LDL goal <70 09/30/2016  . Hypertension   . Myocardial infarction ([place])   . Normochromic normocytic anemia 04/23/2016  . NSTEMI (non-ST elevated myocardial infarction) ([place]) 02/09/2019  . Pneumonia 11/01/2018  . Pneumonia due to COVID-19 virus 07/20/2018  . S/P thoracentesis   . SIRS (systemic inflammatory response syndrome) ([place]) 07/25/2018  . Spinal stenosis   . Stress-induced cardiomyopathy 05/19/2016  . Syncope 04/23/2016  . Type 2 diabetes mellitus with diabetic neuropathy, unspecified ([place]) 05/06/2016  . Vertigo 05/06/2016  . Vitamin D deficiency 04/02/2018   Social History   Socioeconomic History  . Marital status: Widowed    Spouse name: Not on file  . Number of children: Not on  file  . Years of education: Not on file  . Highest education level: Not on file  Occupational History  . Not on file  Tobacco Use  . Smoking status: Never Smoker  . Smokeless tobacco: Never Used  Vaping Use  . Vaping Use: Never used  Substance and Sexual Activity  . Alcohol use: No  . Drug use: No  . Sexual activity: Not Currently    Birth control/protection: None  Other Topics Concern  . Not on file  Social History Narrative  . Not on file   Social Determinants of Health   Financial Resource Strain: Not on file  Food Insecurity: No Food Insecurity  . Worried About Charity fundraiser in the Last Year: Never true  . Ran Out of Food in the Last Year: Never true  Transportation Needs: Not on file  Physical Activity: Not on file  Stress: Not  on file  Social Connections: Not on file   Family History  Problem Relation Age of Onset  . Diabetes Mellitus II Father   . Stroke Father   . Healthy Mother        She is 21 years old.    Scheduled Meds: .  stroke: mapping our early stages of recovery book   Does not apply Once  . allopurinol  200 mg Oral QHS  . aspirin  300 mg Rectal Daily   Or  . aspirin  325 mg Oral Daily  . atorvastatin  80 mg Oral Daily  . clopidogrel  75 mg Oral Daily  . DULoxetine  30 mg Oral Daily  . gabapentin  200 mg Oral BID  . heparin  5,000 Units Subcutaneous Q8H  . pantoprazole  40 mg Oral Daily  . primidone  50 mg Oral QHS  . torsemide  40 mg Oral BID   Continuous Infusions: . sodium chloride 75 mL/hr at 03/19/20 1304   PRN Meds:.acetaminophen, fentaNYL (SUBLIMAZE) injection, LORazepam, ondansetron (ZOFRAN) IV, oxyCODONE, senna-docusate Allergies  Allergen Reactions  . Garlic Shortness Of Breath, Itching, Swelling and Other (See Comments)    "Raw garlic" = Hand itching and swelling  . Isosorbide Other (See Comments)    Sneezing and runny Hanson  . Latex Itching  . Morphine And Related Itching and Other (See Comments)    Headache   .  Other Itching and Other (See Comments)    Reaction to newspaper ink- Headaches, also   Review of Systems  Constitutional: Positive for activity change and fatigue. Negative for appetite change.  Neurological: Positive for weakness.    Physical Exam Vitals and nursing note reviewed.  Constitutional:      General: She is not in acute distress.    Appearance: She is ill-appearing.  Cardiovascular:     Rate and Rhythm: Normal rate.  Pulmonary:     Effort: No tachypnea, accessory muscle usage or respiratory distress.  Abdominal:     Palpations: Abdomen is soft.  Neurological:     Mental Status: She is alert.     Comments: Oriented to person, place, somewhat to situation     Vital Signs: BP 134/77 (BP Location: Left Arm)   Pulse 86   Temp 98.2 F (36.8 C) (Oral)   Resp 16   Ht 4' 11" (1.499 m)   Wt 66.8 kg   SpO2 91%   BMI 29.74 kg/m  Pain Scale: 0-10   Pain Score: 0-No pain   SpO2: SpO2: 91 % O2 Device:SpO2: 91 % O2 Flow Rate: .O2 Flow Rate (L/min): 4 L/min  IO: Intake/output summary:   Intake/Output Summary (Last 24 hours) at 03/19/2020 1400 Last data filed at 03/19/2020 0524 Gross per 24 hour  Intake --  Output 100 ml  Net -100 ml    LBM:   Baseline Weight: Weight: 65.8 kg Most recent weight: Weight: 66.8 kg     Palliative Assessment/Data:     Time In/Out: 1340-1410; 1500-1540 Time Total: 70 min Greater than 50%  of this time was spent counseling and coordinating care related to the above assessment and plan.  Signed by: Vinie Sill, NP Palliative Medicine Team Pager # [phone] (M-F 8a-5p) Team Phone # [phone] (Nights/Weekends)

## 2020-03-19 NOTE — Progress Notes (Addendum)
STROKE TEAM PROGRESS NOTE   INTERVAL HISTORY Pt reclining in bed, having lunch now. Awake alert and interactive. Orientated to time, place and age, able to name but still has difficulty with repeat long sentences. Still has hesitancy of speech and paraphasic errors. Paucity of speech. Pending palliative care consult to decide on GOC. If aggressive care, would recommend anticoagulation and antiplatelet.   OBJECTIVE Vitals:   03/19/20 0055 03/19/20 0114 03/19/20 0400 03/19/20 0746  BP:   132/85 (!) 142/79  Pulse: 81  88 86  Resp: 15  12 15   Temp:   98.2 F (36.8 C) 98.1 F (36.7 C)  TempSrc:   Oral Oral  SpO2: 99%  100% 93%  Weight:  66.8 kg    Height:       CBC:  Recent Labs  Lab 03/11/2020 0926 03/18/20 0311 03/19/20 0236  WBC 6.0 6.3 5.1  NEUTROABS 4.9  --   --   HGB 11.7* 11.3* 11.3*  HCT 39.7 37.3 37.5  MCV 90.2 88.8 89.3  PLT 266 218 161   Basic Metabolic Panel:  Recent Labs  Lab 03/18/20 0311 03/19/20 0236  NA 139 139  K 4.3 4.0  CL 102 102  CO2 24 26  GLUCOSE 121* 73  BUN 38* 37*  CREATININE 2.06* 2.54*  CALCIUM 8.9 8.9  MG 2.3 2.1   Lipid Panel:     Component Value Date/Time   CHOL 180 03/18/2020 0729   CHOL 353 (H) 01/09/2019 0959   TRIG 104 03/18/2020 0729   HDL 34 (L) 03/18/2020 0729   HDL 52 01/09/2019 0959   CHOLHDL 5.3 03/18/2020 0729   VLDL 21 03/18/2020 0729   LDLCALC 125 (H) 03/18/2020 0729   LDLCALC 251 (H) 01/09/2019 0959   HgbA1c:  Lab Results  Component Value Date   HGBA1C 6.8 (H) 03/18/2020   Urine Drug Screen:     Component Value Date/Time   LABOPIA NONE DETECTED 03/27/2020 1450   COCAINSCRNUR NONE DETECTED 03/13/2020 1450   LABBENZ NONE DETECTED 03/18/2020 1450   AMPHETMU NONE DETECTED 03/06/2020 1450   THCU NONE DETECTED 03/31/2020 1450   LABBARB NONE DETECTED 03/04/2020 1450    Alcohol Level No results found for: ETH  IMAGING  CT Angio Head W/Cm &/Or Wo Cm CT Angio Neck W and/or Wo Contrast 03/22/2020 IMPRESSION:   1. No acute intracranial abnormality.  2. Two short segments of occlusion within and M4 segment of the left MCA posterior division branch. This may be secondary to intracranial atherosclerotic disease versus embolic. In case of high clinical suspicion for acute infarct, an MRI of the brain suggested.  3. Severe stenosis of the intracranial right vertebral artery.  4. No hemodynamically significant stenosis in the neck.  5. Bilateral pleural effusion, right greater than left.  6. Bilateral ground-glass opacity of the visualized lungs, mostly on the right side.  7. Aortic atherosclerosis.   CT Head Wo Contrast 03/22/2020 IMPRESSION:  Atrophy with periventricular small vessel disease, stable. No mass or hemorrhage. Multiple foci of arterial vascular calcification. Opacification of multiple mastoid air cells noted. Mucosal thickening in several ethmoid air cells.   MR BRAIN WO CONTRAST 03/18/2020 IMPRESSION:  1. Small, patchy acute infarcts in posterior Left MCA territory cortex. No associated hemorrhage or mass effect.  2. Underlying advanced signal changes in the cerebral white matter and pons, nonspecific but most commonly due to chronic small vessel disease. Solitary chronic microhemorrhage in the right frontal lobe.  3. Bilateral mastoid air cell effusions, probably  postinflammatory.   DG Chest 1 View 03/12/2020 IMPRESSION:  Airspace opacity left lower lobe concerning for potential pneumonia. Small pleural effusions bilaterally. Cardiomegaly. Note that there may be a degree of underlying congestive heart failure.   DG Chest 2 View 03/18/2020 IMPRESSION:  Low lung volumes and persistent opacity at the right lung base, a combination of partially loculated pleural fluid and atelectasis/consolidation. Left lower lobe lobar pneumonia not excluded given the suggestion of air bronchograms.    DG Knee Complete 4 Views Right 04/01/2020 IMPRESSION:  Areas of osteoarthritic change. No  appreciable fracture, dislocation, or joint effusion.    DG HIP UNILAT WITH PELVIS 2-3 VIEWS RIGHT 03/15/2020 IMPRESSION:  No acute fracture or dislocation. Moderate symmetric narrowing each hip joint.   Transthoracic Echocardiogram  1. Left ventricular ejection fraction, by estimation, is 20 to 25%. The  left ventricle has severely decreased function. The left ventricle  demonstrates regional wall motion abnormalities (see scoring  diagram/findings for description). The left  ventricular internal cavity size was mildly dilated. There is akinesis of  the left ventricular, entire anterior wall, anterolateral wall and apical  segment. There is akinesis of the left ventricular, mid-apical lateral  wall, inferolateral wall and  inferior wall. There is akinesis of the left ventricular, basal-mid  anteroseptal wall and inferoseptal wall.  2. Mild mitral valve regurgitation.  3. Large pleural effusion in the left lateral region.  4. The inferior vena cava is normal in size with <50% respiratory  variability, suggesting right atrial pressure of 8 mmHg.  5. Tricuspid regurgitation signal is inadequate for assessing PA  pressure.   ECG - SR rate 87 BPM. (See cardiology reading for complete details)   PHYSICAL EXAM   Temp:  [98 F (36.7 C)-98.6 F (37 C)] 98.1 F (36.7 C) (12/16 0746) Pulse Rate:  [81-117] 86 (12/16 0746) Resp:  [12-20] 15 (12/16 0746) BP: (112-142)/(69-86) 142/79 (12/16 0746) SpO2:  [90 %-100 %] 93 % (12/16 0746) Weight:  [66.8 kg] 66.8 kg (12/16 0114)  General - Well nourished, well developed, in no apparent distress  Ophthalmologic - fundi not visualized due to noncooperation.  Cardiovascular - Regular rhythm and rate  Neuro - awake alert, eyes open, orientated to age, place and time. Paraphasic errors and difficult repeating long sentences, but name 3/3. Paucity of speech with hesitancy and word finding difficulty but follows most one step commands, not  able to follow two step commands. Tracking bilaterally, blinking to visual threat bilaterally. Mild right nasolabial fold flattening. Tongue midline. BUE at least 3+/5, however, right FTN dysmetria. Left FTN intact although slow. BLE 3/5 proximal and 3+/5 distally. Sensation subjectively symmetrical. Gait not tested.    ASSESSMENT/PLAN Ms. Kara Hanson is a 64 y.o. female with history of type 2 DM, HTN, HLD, previous stroke, hx of Covid infection (April 2020), spinal stenosis, NSTEMI, chronic kidney disease stage IV, and CHF (O2 dependent) on home hospice - noncompliant with medications  - (spends most of her time in bed  legs weak from deconditioning) Ms. Shartzer was brought to [facility] ED after 2 falls and complaints of a severe headache (poor historian). She did not receive IV t-PA due to late presentation (>4.5 hours from time of onset).  Stroke: small, patchy acute infarcts in posterior Left MCA territory cortex - likely embolic due to low EF  CT head - Atrophy with periventricular small vessel disease, stable. No mass or hemorrhage. Multiple foci of arterial vascular calcification.   MRI head - Small, patchy acute infarcts  in posterior Left MCA territory cortex. No associated hemorrhage or mass effect.    CTA H&N -  Two short segments of occlusion within and M4 segment of the left MCA posterior division branch. Severe stenosis of the intracranial right vertebral artery.   2D Echo - EF 20-25%  [employer] Virus 2 - negative  LE Venous Dopplers no DVT  LDL - 125  HgbA1c - 6.8  UDS - negative  VTE prophylaxis - Subcutaneous heparin  No antithrombotic prior to admission, now on aspirin 325 mg daily. Will consider ASA 81 and anticoagulation if decided on aggressive care  Patient will be counseled to be compliant with her antithrombotic medications  Ongoing aggressive stroke risk factor management  Therapy recommendations:  SNF  Disposition:  Pending  Cardiomyopathy with low  EF  04/2019 EF 40-45%  09/2019 EF 20-25% - cardiology determined that pt end stage heart failure - home hospice  This admission EF 20-25%  Likely the etiology for the current emoblic stroke  Would recommend anticoagulation if aggressive care  CAD and hx of STEMI  Not on antithrombotics at home for home hospice  Currently on ASA 325  Need antiplatelet long term if aggressive care  Follow up with cardiology  Hypertension  Home BP meds: Hydralazine  Current BP meds: none  BP stable . Long-term BP goal normotensive  Hyperlipidemia  Home Lipid lowering medication: none   LDL 125, goal < 70  Current lipid lowering medication: Lipitor 80 mg daily   Continue statin on discharge  Diabetes  Home diabetic meds: none  SSI  CBG monitoring  HgbA1c 6.8, goal < 7.0  PCP follow up   Other Stroke Risk Factors  Advanced age  Obesity, Body mass index is 29.74 kg/m., recommend weight loss, diet and exercise as appropriate   Family hx stroke (father)   Other Active Problems, Findings and Recommendations  Code status - DNR on home hospice - Palliative Care consult pending  CKD - stage 3b - creatinine - 1.82->2.06->2.54  Bilateral pleural effusion, right greater than left.   Bilateral ground-glass opacity of the visualized lungs, mostly on the right side.   Aortic atherosclerosis.    Hospital day # 2  Rosalin Hawking, MD PhD Stroke Neurology 03/19/2020 8:47 AM    To contact Stroke Continuity provider, please refer to [email or web address] After hours, contact General Neurology

## 2020-03-19 NOTE — Evaluation (Signed)
Occupational Therapy Evaluation Patient Details Name: Kara Hanson MRN: [id number] DOB: [birth year] Today's Date: 03/19/2020    History of Present Illness 64 yo female admitted to ED on 12/14 with SOB, headaches, and falls. On admission, pt was hypoxic (SpO2 in the 60s) and CXR was notable for opacity in the L base concerning for PNA. MRI revealed small acute infarcts in the posterior L MCA territory. There was also evidence of more chronic small vessel disease in the pons. PMH includes vertigo, DM, syncope, PNA, NSTEMI, HTN, HLD, dyspnea, CVA, CKD, CHF.   Clinical Impression   Pt typically walks short distances with RW and uses w/c for longer distances. She can self feed and groom, but is dependent in bathing and dressing. She has an aide who helps her every other day in addition to her family. Pt presents with generalized weakness, edematous R UE and poor standing balance. She requires mod to total assist for ADL. Will follow to address those ADL she was performing prior to admission and to educate in edema management of the R UE.     Follow Up Recommendations  SNF;Supervision/Assistance - 24 hour (vs home with hospice)    Equipment Recommendations  None recommended by OT    Recommendations for Other Services       Precautions / Restrictions Precautions Precautions: Fall Precaution Comments: 2JJ9 chronically Restrictions Weight Bearing Restrictions: No      Mobility Bed Mobility Overal bed mobility: Needs Assistance Bed Mobility: Supine to Sit;Sit to Supine     Supine to sit: Mod assist;HOB elevated Sit to supine: Mod assist;HOB elevated   General bed mobility comments: mod for LEs over EOB and to raise trunk, assist for LEs back into bed    Transfers Overall transfer level: Needs assistance Equipment used: Rolling walker (2 wheeled) Transfers: Sit to/from Stand Sit to Stand: Min assist;From elevated surface         General transfer comment: assist to rise and  steady with RW, pt with initial posterior bias    Balance Overall balance assessment: Needs assistance Sitting-balance support: Feet supported Sitting balance-Leahy Scale: Fair     Standing balance support: Bilateral upper extremity supported Standing balance-Leahy Scale: Poor                             ADL either performed or assessed with clinical judgement   ADL Overall ADL's : Needs assistance/impaired Eating/Feeding: Moderate assistance;Sitting   Grooming: Brushing hair;Sitting;Total assistance;Wash/dry hands;Wash/dry face;Moderate assistance                   Toilet Transfer: Moderate assistance;Stand-pivot;BSC;RW   Toileting- Clothing Manipulation and Hygiene: Total assistance;Sit to/from stand         General ADL Comments: pt with baseline dependence in bathing and dressing     Vision Patient Visual Report: No change from baseline       Perception     Praxis      Pertinent Vitals/Pain Pain Assessment: No/denies pain     Hand Dominance Right   Extremity/Trunk Assessment Upper Extremity Assessment Upper Extremity Assessment: RUE deficits/detail;Generalized weakness RUE Deficits / Details: edematous, elevated and instructed pt to perform AROM RUE Coordination: decreased fine motor;decreased gross motor   Lower Extremity Assessment Lower Extremity Assessment: Defer to PT evaluation       Communication Communication Communication: Expressive difficulties (expressive aphasia)   Cognition Arousal/Alertness: Awake/alert Behavior During Therapy: WFL for tasks assessed/performed Overall Cognitive Status: Difficult to  assess                                 General Comments: answering questions with short responses, makes her needs known   General Comments       Exercises     Shoulder Instructions      Home Living Family/patient expects to be discharged to:: Private residence Living Arrangements:  Children Available Help at Discharge: Family;Available 24 hours/day;Personal care attendant Type of Home: House Home Access: Level entry     Home Layout: One level     Bathroom Shower/Tub: Teacher, early years/pre: Standard     Home Equipment: Environmental consultant - 2 wheels;Tub bench;Wheelchair - manual   Additional Comments: aide comes every other day and bathes her      Prior Functioning/Environment Level of Independence: Needs assistance  Gait / Transfers Assistance Needed: pt reports using RW for short distances, using w/c ADL's / Homemaking Assistance Needed: pt receives assist from daugther and aide for BADL, self feeds and grooms            OT Problem List: Decreased strength;Decreased activity tolerance;Impaired balance (sitting and/or standing);Decreased coordination;Decreased cognition;Decreased knowledge of use of DME or AE;Cardiopulmonary status limiting activity;Impaired UE functional use;Increased edema      OT Treatment/Interventions: Self-care/ADL training;DME and/or AE instruction;Therapeutic activities;Patient/family education;Balance training;Cognitive remediation/compensation    OT Goals(Current goals can be found in the care plan section) Acute Rehab OT Goals Patient Stated Goal: return home OT Goal Formulation: With patient Time For Goal Achievement: 04/02/20 Potential to Achieve Goals: Good ADL Goals Pt Will Perform Eating: with min assist;sitting (AE as needed) Pt Will Perform Grooming: with min assist;sitting Pt Will Transfer to Toilet: with min assist;ambulating;bedside commode  OT Frequency: Min 2X/week   Barriers to D/C:            Co-evaluation              AM-PAC OT "6 Clicks" Daily Activity     Outcome Measure Help from another person eating meals?: A Lot Help from another person taking care of personal grooming?: Total Help from another person toileting, which includes using toliet, bedpan, or urinal?: Total Help from another  person bathing (including washing, rinsing, drying)?: Total Help from another person to put on and taking off regular upper body clothing?: A Lot Help from another person to put on and taking off regular lower body clothing?: Total 6 Click Score: 8   End of Session Equipment Utilized During Treatment: Rolling walker;Gait belt;Oxygen (4L)  Activity Tolerance: Patient tolerated treatment well Patient left: in bed;with call bell/phone within reach;with bed alarm set  OT Visit Diagnosis: Unsteadiness on feet (R26.81);Other abnormalities of gait and mobility (R26.89);Muscle weakness (generalized) (M62.81);Cognitive communication deficit (R41.841)                Time: 8185-6314 OT Time Calculation (min): 11 min Charges:  OT General Charges $OT Visit: 1 Visit OT Evaluation $OT Eval Moderate Complexity: 1 Mod  Nestor Lewandowsky, OTR/L Acute Rehabilitation Services Pager: [phone] Office: [phone]  Malka So 03/19/2020, 11:11 AM

## 2020-03-19 NOTE — Progress Notes (Signed)
   03/19/20 0524  Output (mL)  Urine 50 mL  Urine Characteristics  Urinary Interventions Bladder scan  Bladder Scan Volume (mL) 146 mL   Pt had a total of U/O 100 all night. Bladder scanned every 6 Hr. Latest above note.

## 2020-03-19 NOTE — Progress Notes (Signed)
[facility] PROGRESS NOTE    Progress Note  Kara Hanson  GUY:[id number] DOB: [birth year] DOA: 03/08/2020 PCP: [name], MD     Brief Narrative:   Kara Hanson is an 64 y.o. female past medical history of chronic combined systolic and diastolic heart failure on 4 L supplemental oxygen for COPD, chronic kidney disease stage IV not adherence to her medication, mostly bedbound with severe deconditioning per family's report who has suffered 2 falls over the last 24 hours, comes to the ED for shortness of breath headaches and fall.  She was found afebrile saturating in the 60s chest x-ray was notable for left based air opacity and a small bilateral pleural effusion imaging showed no acute findings except except for CTA neck that showed 2 short segments of occlusion within the left MCA, BNP of 4000, troponins slightly elevated but flat neurology was consulted by the emergency room physician.  She was started on IV Lasix Benadryl Toradol fentanyl Zofran and potassium supplements.  Assessment/Plan:   Acute CVA: Headache resolved on admission, MRI of the brain was ordered showed small acute infarct of the left MCA territory no hemorrhage or mass-effect.  Chronic small vessel disease to cerebral white matter and pons. HgbA1c 6.8, fasting lipid panel HDL < 40, LDL > 70 start statins Physical therapy evaluated the patient and recommended skilled nursing facility. Continue aspirin,Plavix and Lipitor. Speech eval. rec. NPO. Significantly improved. No events on telemetry. Neurology was consulted and awaiting palliative care meeting on family's decision.  For further work-up. Just spoke to the patient and she relates she would not like to move towards comfort care, she is agreeable to talking and meeting with palliative care.  Chronic combined systolic and diastolic CHF (congestive heart failure) (HCC) Chest x-ray showing basilar and bilateral pleural effusions, with elevated  BNP. These effusions appear to be chronic, she appears euvolemic on physical exam going back through the chart her weight is unchanged from her estimated dry weight, which is around 63 to 65 kg Transition her to oral Demadex home dose.  Chronic kidney disease stage IV: Creatinine appears to be at baseline around 1.8.  Hypokalemia: Improved now 4.3, will put her on a supplement as she will be on IV Lasix.  Type 2 diabetes mellitus with diabetic neuropathy, unspecified ([place]) Fairly controlled continue sliding scale insulin, hemoglobin A1c of 6.8.   DVT prophylaxis: lovenox Family Communication:none Status is: Inpatient  Remains inpatient appropriate because:Hemodynamically unstable   Dispo: The patient is from: Home              Anticipated d/c is to: Home              Anticipated d/c date is: > 3 days              Patient currently is not medically stable to d/c.   Code Status:     Code Status Orders  (From admission, onward)         Start     Ordered   03/24/2020 2223  Do not attempt resuscitation (DNR)  Continuous       Question Answer Comment  In the event of cardiac or respiratory ARREST Do not call a "code blue"   In the event of cardiac or respiratory ARREST Do not perform Intubation, CPR, defibrillation or ACLS   In the event of cardiac or respiratory ARREST Use medication by any route, position, wound care, and other measures to relive pain and suffering. May use oxygen, suction and  manual treatment of airway obstruction as needed for comfort.      03/12/2020 2225        Code Status History    Date Active Date Inactive Code Status Order ID Comments User Context   11/29/2019 1309 12/02/2019 2123 DNR [id number]  Lin Landsman, NP Inpatient   11/27/2019 1701 11/29/2019 1309 Full Code [id number]  Darliss Cheney, MD ED   11/19/2019 0830 11/26/2019 1901 Full Code [id number]  Norval Morton, MD ED   09/26/2019 1506 10/07/2019 2156 Full Code [id number]  Mckinley Jewel, MD ED    05/18/2019 1240 05/23/2019 0153 Full Code [id number]  Lequita Halt, MD ED   04/18/2019 0556 04/20/2019 2128 Full Code [id number]  Shela Leff, MD Inpatient   02/09/2019 0436 02/19/2019 2225 Full Code [id number]  Rise Patience, MD ED   01/23/2019 1757 02/08/2019 0133 Full Code [id number]  Georgette Shell, MD ED   11/01/2018 0029 11/05/2018 2014 Full Code [id number]  Sid Falcon, MD ED   09/24/2018 2303 10/02/2018 1600 Full Code [id number]  Elwyn Reach, MD Inpatient   07/20/2018 1655 07/26/2018 1800 Full Code [id number]  Debbe Odea, MD ED   03/20/2018 0402 03/24/2018 1949 Full Code [id number]  Norval Morton, MD ED   11/27/2017 0705 11/30/2017 1625 Full Code [id number]  Radene Gunning, NP ED   10/01/2016 0033 10/01/2016 1946 Full Code [id number]  Maryellen Pile, MD ED   06/17/2016 1720 06/18/2016 2022 Full Code [id number]  Reyne Dumas, MD ED   04/23/2016 0418 04/26/2016 1716 Full Code [id number]  Rise Patience, MD ED   Advance Care Planning Activity        IV Access:    Peripheral IV   Procedures and diagnostic studies:   CT Angio Head W/Cm &/Or Wo Cm  Result Date: 03/07/2020 CLINICAL DATA:  Intractable headache. EXAM: CT ANGIOGRAPHY HEAD AND NECK TECHNIQUE: Multidetector CT imaging of the head and neck was performed using the standard protocol during bolus administration of intravenous contrast. Multiplanar CT image reconstructions and MIPs were obtained to evaluate the vascular anatomy. Carotid stenosis measurements (when applicable) are obtained utilizing NASCET criteria, using the distal internal carotid diameter as the denominator. CONTRAST:  61mL OMNIPAQUE IOHEXOL 350 MG/ML SOLN COMPARISON:  None. FINDINGS: CT HEAD FINDINGS Brain: No evidence of acute infarction, hemorrhage, hydrocephalus, extra-axial collection or mass lesion/mass effect. Vascular: No hyperdense vessel. Prominent calcified plaques in the bilateral carotid siphons. Skull: Normal. Negative for fracture or  focal lesion. Sinuses: Bilateral mastoid effusion. Orbits: Right lens surgery.  No acute findings. Review of the MIP images confirms the above findings CTA NECK FINDINGS Aortic arch: Common origin of the innominate and left common carotid artery from the aortic arch. Imaged portion shows no evidence of aneurysm or dissection. Calcified plaques are noted in the aortic arch and at the origin of the left subclavian artery without significant stenosis. Right carotid system: Calcified atherosclerotic plaque in the right carotid bifurcation without hemodynamically significant stenosis. There is increased tortuosity of the cervical segment of the right ICA. Left carotid system: Mild atherosclerotic plaques in the left common carotid artery and left carotid bifurcation without hemodynamically significant stenosis. Vertebral arteries: Codominant. No evidence of dissection, stenosis (50% or greater) or occlusion in the neck. Skeleton: Degenerative changes of the cervical spine. No aggressive bone lesion or acute finding. Other neck: Mildly heterogeneous thyroid gland with a 6 mm partially calcified nodule in the left thyroid lobe. Upper chest: Bilateral ground-glass opacities, right greater than  left. Multiple lymph nodes in the superior mediastinum with a precarinal lymph node measuring 11 mm. Bilateral pleural effusion, right greater than left. Review of the MIP images confirms the above findings CTA HEAD FINDINGS Anterior circulation: Calcified plaques in the bilateral carotid siphons with mild stenosis at the paraclinoid segment bilaterally. Hypoplastic right A1/ACA with dominant left A1/ACA segment. Two short segments of occlusion within and M4 segment of the left MCA posterior division branch (series 12, image 15). The bilateral ACA and MCA vascular trees are otherwise maintained. Posterior circulation: Partially calcified atherosclerotic plaques in the intracranial right vertebral artery resulting in severe stenosis.  The intracranial left vertebral artery is maintained. The basilar artery and posterior cerebral arteries have normal course and caliber. Venous sinuses: As permitted by contrast timing, patent. Anatomic variants: Hypoplastic right A1/ACA. Review of the MIP images confirms the above findings IMPRESSION: 1. No acute intracranial abnormality. 2. Two short segments of occlusion within and M4 segment of the left MCA posterior division branch. This may be secondary to intracranial atherosclerotic disease versus embolic. In case of high clinical suspicion for acute infarct, an MRI of the brain suggested. 3. Severe stenosis of the intracranial right vertebral artery. 4. No hemodynamically significant stenosis in the neck. 5. Bilateral pleural effusion, right greater than left. 6. Bilateral ground-glass opacity of the visualized lungs, mostly on the right side. 7. Aortic atherosclerosis. These results were called by telephone at the time of interpretation on 03/18/2020 at 3:31 pm to provider [facility] , who verbally acknowledged these results. Aortic Atherosclerosis (ICD10-I70.0). Electronically Signed   By: Pedro Earls M.D.   On: 03/04/2020 15:34   DG Chest 1 View  Result Date: 03/18/2020 CLINICAL DATA:  Pain following fall EXAM: CHEST  1 VIEW COMPARISON:  November 27, 2019 FINDINGS: There is airspace consolidation in the left base. There are small pleural effusions bilaterally. There is cardiomegaly with pulmonary vascularity normal. No adenopathy. No pneumothorax. There is aortic atherosclerosis. No bone lesions. IMPRESSION: Airspace opacity left lower lobe concerning for potential pneumonia. Small pleural effusions bilaterally. Cardiomegaly. Note that there may be a degree of underlying congestive heart failure. Electronically Signed   By: Lowella Grip III M.D.   On: 03/23/2020 10:26   DG Chest 2 View  Result Date: 03/18/2020 CLINICAL DATA:  64 year old female with acute on chronic  respiratory failure and hypoxia EXAM: CHEST - 2 VIEW COMPARISON:  03/30/2020 FINDINGS: Cardiomediastinal silhouette unchanged. Low lung volumes persist. Opacity at the right lung base persists with partial obscuration of the right hemidiaphragm and thickening of the pleuroparenchymal interface. Opacity in the minor fissure persists. Air bronchograms persist at the left lung base. Meniscus on the lateral view. No pneumothorax. Coarsened interstitial markings. IMPRESSION: Low lung volumes and persistent opacity at the right lung base, a combination of partially loculated pleural fluid and atelectasis/consolidation. Left lower lobe lobar pneumonia not excluded given the suggestion of air bronchograms. Electronically Signed   By: Corrie Mckusick D.O.   On: 03/18/2020 08:16   CT Head Wo Contrast  Result Date: 03/23/2020 CLINICAL DATA:  Pain following fall.  Reported delirium EXAM: CT HEAD WITHOUT CONTRAST TECHNIQUE: Contiguous axial images were obtained from the base of the skull through the vertex without intravenous contrast. COMPARISON:  January 28, 2019 FINDINGS: Brain: There is mild diffuse atrophy. There is no intracranial mass, hemorrhage, extra-axial fluid collection, or midline shift. Brain parenchyma appears unremarkable. No appreciable acute infarct. Vascular: No hyperdense vessel. There is calcification in each distal vertebral  artery and carotid siphon region. Skull: Bony calvarium appears intact. Sinuses/Orbits: There is mucosal thickening in several ethmoid air cells. Other visualized paranasal sinuses are clear. Status post cataract removal on the right. Orbits otherwise appear symmetric bilaterally. Other: Opacification of multiple mastoid air cells noted. IMPRESSION: Atrophy with periventricular small vessel disease, stable. No mass or hemorrhage. Multiple foci of arterial vascular calcification. Opacification of multiple mastoid air cells noted. Mucosal thickening in several ethmoid air cells.  Electronically Signed   By: Lowella Grip III M.D.   On: 04/03/2020 10:33   CT Angio Neck W and/or Wo Contrast  Result Date: 03/18/2020 CLINICAL DATA:  Intractable headache. EXAM: CT ANGIOGRAPHY HEAD AND NECK TECHNIQUE: Multidetector CT imaging of the head and neck was performed using the standard protocol during bolus administration of intravenous contrast. Multiplanar CT image reconstructions and MIPs were obtained to evaluate the vascular anatomy. Carotid stenosis measurements (when applicable) are obtained utilizing NASCET criteria, using the distal internal carotid diameter as the denominator. CONTRAST:  50mL OMNIPAQUE IOHEXOL 350 MG/ML SOLN COMPARISON:  None. FINDINGS: CT HEAD FINDINGS Brain: No evidence of acute infarction, hemorrhage, hydrocephalus, extra-axial collection or mass lesion/mass effect. Vascular: No hyperdense vessel. Prominent calcified plaques in the bilateral carotid siphons. Skull: Normal. Negative for fracture or focal lesion. Sinuses: Bilateral mastoid effusion. Orbits: Right lens surgery.  No acute findings. Review of the MIP images confirms the above findings CTA NECK FINDINGS Aortic arch: Common origin of the innominate and left common carotid artery from the aortic arch. Imaged portion shows no evidence of aneurysm or dissection. Calcified plaques are noted in the aortic arch and at the origin of the left subclavian artery without significant stenosis. Right carotid system: Calcified atherosclerotic plaque in the right carotid bifurcation without hemodynamically significant stenosis. There is increased tortuosity of the cervical segment of the right ICA. Left carotid system: Mild atherosclerotic plaques in the left common carotid artery and left carotid bifurcation without hemodynamically significant stenosis. Vertebral arteries: Codominant. No evidence of dissection, stenosis (50% or greater) or occlusion in the neck. Skeleton: Degenerative changes of the cervical spine. No  aggressive bone lesion or acute finding. Other neck: Mildly heterogeneous thyroid gland with a 6 mm partially calcified nodule in the left thyroid lobe. Upper chest: Bilateral ground-glass opacities, right greater than left. Multiple lymph nodes in the superior mediastinum with a precarinal lymph node measuring 11 mm. Bilateral pleural effusion, right greater than left. Review of the MIP images confirms the above findings CTA HEAD FINDINGS Anterior circulation: Calcified plaques in the bilateral carotid siphons with mild stenosis at the paraclinoid segment bilaterally. Hypoplastic right A1/ACA with dominant left A1/ACA segment. Two short segments of occlusion within and M4 segment of the left MCA posterior division branch (series 12, image 15). The bilateral ACA and MCA vascular trees are otherwise maintained. Posterior circulation: Partially calcified atherosclerotic plaques in the intracranial right vertebral artery resulting in severe stenosis. The intracranial left vertebral artery is maintained. The basilar artery and posterior cerebral arteries have normal course and caliber. Venous sinuses: As permitted by contrast timing, patent. Anatomic variants: Hypoplastic right A1/ACA. Review of the MIP images confirms the above findings IMPRESSION: 1. No acute intracranial abnormality. 2. Two short segments of occlusion within and M4 segment of the left MCA posterior division branch. This may be secondary to intracranial atherosclerotic disease versus embolic. In case of high clinical suspicion for acute infarct, an MRI of the brain suggested. 3. Severe stenosis of the intracranial right vertebral artery. 4. No hemodynamically  significant stenosis in the neck. 5. Bilateral pleural effusion, right greater than left. 6. Bilateral ground-glass opacity of the visualized lungs, mostly on the right side. 7. Aortic atherosclerosis. These results were called by telephone at the time of interpretation on 03/07/2020 at 3:31 pm  to provider [facility] , who verbally acknowledged these results. Aortic Atherosclerosis (ICD10-I70.0). Electronically Signed   By: Pedro Earls M.D.   On: 03/05/2020 15:34   MR BRAIN WO CONTRAST  Result Date: 03/18/2020 CLINICAL DATA:  64 year old female with severe headache. Distal right vertebral artery severe stenosis and abnormal distal left MCA branches on recent CTA. EXAM: MRI HEAD WITHOUT CONTRAST TECHNIQUE: Multiplanar, multiecho pulse sequences of the brain and surrounding structures were obtained without intravenous contrast. COMPARISON:  CT head, CTA head and neck 03/19/2020 FINDINGS: Brain: Mild to moderate gyriform restricted diffusion in the posterior left MCA territory, lateral postcentral parietal lobe cortex on series 5, image 83, and also left posterior insula (image 77). No other restricted diffusion. Some T2 shine through suspected in the splenium. T2 and FLAIR hyperintense mild cytotoxic edema at the restricted areas. No associated acute hemorrhage or mass effect. Patchy and confluent superimposed bilateral cerebral white matter T2 and FLAIR hyperintensity, including some direct involvement and thinning of the corpus callosum. Chronic microhemorrhage in the right frontal lobe subcortical white matter on series 14, image 35. Moderate similar T2 and FLAIR hyperintensity in the pons. Deep gray nuclei and cerebellum remain within normal limits. No midline shift, mass effect, evidence of mass lesion, ventriculomegaly, extra-axial collection or acute intracranial hemorrhage. Cervicomedullary junction and pituitary are within normal limits. Vascular: Major intracranial vascular flow voids are preserved. Skull and upper cervical spine: Multilevel cervical spine degeneration, up to mild degenerative spinal stenosis. Visualized bone marrow signal is within normal limits. Sinuses/Orbits: Postoperative changes to the right globe. Negative paranasal sinuses. Other: Bilateral  mastoid air cell effusions. Negative nasopharynx. Other visible internal auditory structures appear grossly normal. IMPRESSION: 1. Small, patchy acute infarcts in posterior Left MCA territory cortex. No associated hemorrhage or mass effect. 2. Underlying advanced signal changes in the cerebral white matter and pons, nonspecific but most commonly due to chronic small vessel disease. Solitary chronic microhemorrhage in the right frontal lobe. 3. Bilateral mastoid air cell effusions, probably postinflammatory. Electronically Signed   By: Genevie Ann M.D.   On: 03/18/2020 06:37   DG Knee Complete 4 Views Right  Result Date: 03/24/2020 CLINICAL DATA:  Pain following fall EXAM: RIGHT KNEE - COMPLETE 4+ VIEW COMPARISON:  None. FINDINGS: Frontal, lateral, and bilateral oblique views were obtained. There is no fracture, dislocation, or joint effusion. There is spurring in all compartments. There is mild narrowing medially. No erosion. IMPRESSION: Areas of osteoarthritic change. No appreciable fracture, dislocation, or joint effusion. Electronically Signed   By: Lowella Grip III M.D.   On: 03/05/2020 10:28   DG HIP UNILAT WITH PELVIS 2-3 VIEWS RIGHT  Result Date: 03/25/2020 CLINICAL DATA:  Pain following fall EXAM: DG HIP (WITH OR WITHOUT PELVIS) 2-3V RIGHT COMPARISON:  None. FINDINGS: Frontal pelvis as well as frontal and lateral right hip images were obtained. There is no fracture or dislocation. There is moderate symmetric narrowing of each hip joint. No erosive change. IMPRESSION: No acute fracture or dislocation. Moderate symmetric narrowing each hip joint. Electronically Signed   By: Lowella Grip III M.D.   On: 03/04/2020 10:26   VAS [place] LOWER EXTREMITY VENOUS (DVT)  Result Date: 03/18/2020  Lower Venous DVT Study Indications:  Stroke.  Risk Factors: None identified. Limitations: Poor ultrasound/tissue interface and patient pain tolerance. Comparison Study: No prior studies. Performing Technologist:  Oliver Hum RVT  Examination Guidelines: A complete evaluation includes B-mode imaging, spectral Doppler, color Doppler, and power Doppler as needed of all accessible portions of each vessel. Bilateral testing is considered an integral part of a complete examination. Limited examinations for reoccurring indications may be performed as noted. The reflux portion of the exam is performed with the patient in reverse Trendelenburg.  +---------+---------------+---------+-----------+----------+--------------+ RIGHT    CompressibilityPhasicitySpontaneityPropertiesThrombus Aging +---------+---------------+---------+-----------+----------+--------------+ CFV      Full           Yes      Yes                                 +---------+---------------+---------+-----------+----------+--------------+ SFJ      Full                                                        +---------+---------------+---------+-----------+----------+--------------+ FV Prox  Full                                                        +---------+---------------+---------+-----------+----------+--------------+ FV Mid                  Yes      Yes                                 +---------+---------------+---------+-----------+----------+--------------+ FV Distal               Yes      Yes                                 +---------+---------------+---------+-----------+----------+--------------+ PFV      Full                                                        +---------+---------------+---------+-----------+----------+--------------+ POP      Full           Yes      Yes                                 +---------+---------------+---------+-----------+----------+--------------+ PTV      Full                                                        +---------+---------------+---------+-----------+----------+--------------+ PERO     Full                                                         +---------+---------------+---------+-----------+----------+--------------+   +---------+---------------+---------+-----------+----------+--------------+  LEFT     CompressibilityPhasicitySpontaneityPropertiesThrombus Aging +---------+---------------+---------+-----------+----------+--------------+ CFV      Full           Yes      Yes                                 +---------+---------------+---------+-----------+----------+--------------+ SFJ      Full                                                        +---------+---------------+---------+-----------+----------+--------------+ FV Prox  Full                                                        +---------+---------------+---------+-----------+----------+--------------+ FV Mid                  Yes      Yes                                 +---------+---------------+---------+-----------+----------+--------------+ FV Distal               Yes      Yes                                 +---------+---------------+---------+-----------+----------+--------------+ PFV      Full                                                        +---------+---------------+---------+-----------+----------+--------------+ POP      Full           Yes      Yes                                 +---------+---------------+---------+-----------+----------+--------------+ PTV      Full                                                        +---------+---------------+---------+-----------+----------+--------------+ PERO     Full                                                        +---------+---------------+---------+-----------+----------+--------------+     Summary: RIGHT: - There is no evidence of deep vein thrombosis in the lower extremity. However, portions of this examination were limited- see technologist comments above.  - No cystic structure found in the popliteal fossa.  LEFT: - There is no evidence of deep  vein thrombosis  in the lower extremity. However, portions of this examination were limited- see technologist comments above.  - No cystic structure found in the popliteal fossa.  *See table(s) above for measurements and observations. Electronically signed by Harold Barban MD on 03/18/2020 at 10:02:38 PM.    Final    ECHOCARDIOGRAM LIMITED  Result Date: 03/18/2020    ECHOCARDIOGRAM LIMITED REPORT   Patient Name:   Kara Hanson Date of Exam: 03/18/2020 Medical Rec #:  [id number]       Height:       59.0 in Accession #:    [id number]      Weight:       145.1 lb Date of Birth:  03-01-1956       BSA:          1.609 m Patient Age:    22 years        BP:           122/86 mmHg Patient Gender: F               HR:           121 bpm. Exam Location:  Inpatient Procedure: Limited Echo Indications:    CVA  History:        Patient has prior history of Echocardiogram examinations, most                 recent 09/27/2019. CHF, Previous Myocardial Infarction, Stroke,                 Signs/Symptoms:Shortness of Breath and Chest Pain; Risk                 Factors:Diabetes, Hypertension and Dyslipidemia. Pleural                 effusions.  Sonographer:    Dustin Flock Referring Phys: [id number] [place]  Sonographer Comments: Limited echo-HR elevated. IMPRESSIONS  1. Left ventricular ejection fraction, by estimation, is 20 to 25%. The left ventricle has severely decreased function. The left ventricle demonstrates regional wall motion abnormalities (see scoring diagram/findings for description). The left ventricular internal cavity size was mildly dilated. There is akinesis of the left ventricular, entire anterior wall, anterolateral wall and apical segment. There is akinesis of the left ventricular, mid-apical lateral wall, inferolateral wall and inferior wall. There is akinesis of the left ventricular, basal-mid anteroseptal wall and inferoseptal wall.  2. Mild mitral valve regurgitation.  3. Large pleural effusion in  the left lateral region.  4. The inferior vena cava is normal in size with <50% respiratory variability, suggesting right atrial pressure of 8 mmHg.  5. Tricuspid regurgitation signal is inadequate for assessing PA pressure. FINDINGS  Left Ventricle: Left ventricular ejection fraction, by estimation, is 20 to 25%. The left ventricle has severely decreased function. The left ventricle demonstrates regional wall motion abnormalities. The left ventricular internal cavity size was mildly  dilated. Right Ventricle: Tricuspid regurgitation signal is inadequate for assessing PA pressure. Mitral Valve: Mild mitral valve regurgitation. Tricuspid Valve: Tricuspid valve regurgitation is mild. Venous: The inferior vena cava is normal in size with less than 50% respiratory variability, suggesting right atrial pressure of 8 mmHg. Additional Comments: There is a large pleural effusion in the left lateral region. LEFT VENTRICLE PLAX 2D LVIDd:         5.40 cm LVIDs:         4.90 cm LV PW:         1.20 cm LV IVS:  0.90 cm  LV Volumes (MOD) LV vol d, MOD A4C: 147.0 ml LV vol s, MOD A4C: 117.0 ml LV SV MOD A4C:     147.0 ml LEFT ATRIUM         Index LA diam:    3.70 cm 2.30 cm/m Fransico Him MD Electronically signed by Fransico Him MD Signature Date/Time: 03/18/2020/3:57:57 PM    Final      Medical Consultants:    None.  Anti-Infectives:   None  Subjective:    Kara Hanson she has no new complaints this morning.  Objective:    Vitals:   03/19/20 0055 03/19/20 0114 03/19/20 0400 03/19/20 0746  BP:   132/85 (!) 142/79  Pulse: 81  88 86  Resp: 15  12 15   Temp:   98.2 F (36.8 C) 98.1 F (36.7 C)  TempSrc:   Oral Oral  SpO2: 99%  100% 93%  Weight:  66.8 kg    Height:       SpO2: 93 % O2 Flow Rate (L/min): 4 L/min   Intake/Output Summary (Last 24 hours) at 03/19/2020 0836 Last data filed at 03/19/2020 0524 Gross per 24 hour  Intake --  Output 100 ml  Net -100 ml   Filed Weights    03/18/20 0100 03/19/20 0114  Weight: 65.8 kg 66.8 kg    Exam: General exam: In no acute distress. Respiratory system: Good air movement and clear to auscultation. Cardiovascular system: S1 & S2 heard, RRR. No JVD. Gastrointestinal system: Abdomen is nondistended, soft and nontender.  Extremities: No pedal edema. Skin: No rashes, lesions or ulcers Psychiatry: Judgement and insight appear normal.  Data Reviewed:    Labs: Basic Metabolic Panel: Recent Labs  Lab 03/07/2020 0926 03/25/2020 1458 03/18/20 0311 03/19/20 0236  NA 141  --  139 139  K 3.2*  --  4.3 4.0  CL 100  --  102 102  CO2 25  --  24 26  GLUCOSE 103*  --  121* 73  BUN 35*  --  38* 37*  CREATININE 1.82*  --  2.06* 2.54*  CALCIUM 9.3  --  8.9 8.9  MG  --  1.8 2.3 2.1   GFR Estimated Creatinine Clearance: 18.3 mL/min (A) (by C-G formula based on SCr of 2.54 mg/dL (H)). Liver Function Tests: Recent Labs  Lab 03/25/2020 0926  AST 10*  ALT 7  ALKPHOS 106  BILITOT 0.8  PROT 5.7*  ALBUMIN 2.6*   Recent Labs  Lab 03/07/2020 0926  LIPASE 15   No results for input(s): AMMONIA in the last 168 hours. Coagulation profile No results for input(s): INR, PROTIME in the last 168 hours. COVID-19 Labs  No results for input(s): DDIMER, FERRITIN, LDH, CRP in the last 72 hours.  Lab Results  Component Value Date   SARSCOV2NAA NEGATIVE 04/03/2020   SARSCOV2NAA NEGATIVE 11/27/2019   SARSCOV2NAA NEGATIVE 11/19/2019   [place] NEGATIVE 09/26/2019    CBC: Recent Labs  Lab 03/19/2020 0926 03/18/20 0311 03/19/20 0236  WBC 6.0 6.3 5.1  NEUTROABS 4.9  --   --   HGB 11.7* 11.3* 11.3*  HCT 39.7 37.3 37.5  MCV 90.2 88.8 89.3  PLT 266 218 228   Cardiac Enzymes: No results for input(s): CKTOTAL, CKMB, CKMBINDEX, TROPONINI in the last 168 hours. BNP (last 3 results) No results for input(s): PROBNP in the last 8760 hours. CBG: Recent Labs  Lab 03/07/2020 1706  GLUCAP 85   D-Dimer: No results for input(s): DDIMER  in the  last 72 hours. Hgb A1c: Recent Labs    03/18/20 0729  HGBA1C 6.8*   Lipid Profile: Recent Labs    03/18/20 0729  CHOL 180  HDL 34*  LDLCALC 125*  TRIG 104  CHOLHDL 5.3   Thyroid function studies: Recent Labs    03/18/20 0729  TSH 3.311   Anemia work up: No results for input(s): VITAMINB12, FOLATE, FERRITIN, TIBC, IRON, RETICCTPCT in the last 72 hours. Sepsis Labs: Recent Labs  Lab 03/11/2020 0926 03/30/2020 1458 03/18/20 0311 03/19/20 0236  PROCALCITON  --  <0.10 <0.10 0.16  WBC 6.0  --  6.3 5.1   Microbiology Recent Results (from the past 240 hour(s))  Urine culture     Status: None   Collection Time: 03/27/2020  9:51 AM   Specimen: Urine, Random  Result Value Ref Range Status   Specimen Description URINE, RANDOM  Final   Special Requests NONE  Final   Culture   Final    NO GROWTH Performed at [facility], [place] [address] [place], [place] [address]    Report Status 03/18/2020 FINAL  Final  Resp Panel by RT-PCR (Flu A&B, Covid) Nasopharyngeal Swab     Status: None   Collection Time: 03/23/2020 10:34 AM   Specimen: Nasopharyngeal Swab; Nasopharyngeal(NP) swabs in vial transport medium  Result Value Ref Range Status   SARS Coronavirus 2 by RT PCR NEGATIVE NEGATIVE Final    Comment: (NOTE) SARS-CoV-2 target nucleic acids are NOT DETECTED.  The SARS-CoV-2 RNA is generally detectable in upper respiratory specimens during the acute phase of infection. The lowest concentration of SARS-CoV-2 viral copies this assay can detect is 138 copies/mL. A negative result does not preclude SARS-Cov-2 infection and should not be used as the sole basis for treatment or other patient management decisions. A negative result may occur with  improper specimen collection/handling, submission of specimen other than nasopharyngeal swab, presence of viral mutation(s) within the areas targeted by this assay, and inadequate number of viral copies(<138 copies/mL). A negative  result must be combined with clinical observations, patient history, and epidemiological information. The expected result is Negative.  Fact Sheet for Patients:  EntrepreneurPulse.com.au  Fact Sheet for Healthcare Providers:  IncredibleEmployment.be  This test is no t yet approved or cleared by the [place] FDA and  has been authorized for detection and/or diagnosis of SARS-CoV-2 by FDA under an Emergency Use Authorization (EUA). This EUA will remain  in effect (meaning this test can be used) for the duration of the COVID-19 declaration under Section 564(b)(1) of the Act, 21 U.S.C.section 360bbb-3(b)(1), unless the authorization is terminated  or revoked sooner.       Influenza A by PCR NEGATIVE NEGATIVE Final   Influenza B by PCR NEGATIVE NEGATIVE Final    Comment: (NOTE) The Xpert Xpress SARS-CoV-2/FLU/RSV plus assay is intended as an aid in the diagnosis of influenza from Nasopharyngeal swab specimens and should not be used as a sole basis for treatment. Nasal washings and aspirates are unacceptable for Xpert Xpress SARS-CoV-2/FLU/RSV testing.  Fact Sheet for Patients: EntrepreneurPulse.com.au  Fact Sheet for Healthcare Providers: IncredibleEmployment.be  This test is not yet approved or cleared by the [place] FDA and has been authorized for detection and/or diagnosis of SARS-CoV-2 by FDA under an Emergency Use Authorization (EUA). This EUA will remain in effect (meaning this test can be used) for the duration of the COVID-19 declaration under Section 564(b)(1) of the Act, 21 U.S.C. section 360bbb-3(b)(1), unless the authorization is terminated  or revoked.  Performed at [facility], [place] [address], [place], [place] [address]      Medications:   .  stroke: mapping our early stages of recovery book   Does not apply Once  . allopurinol  200 mg Oral QHS  . aspirin  300 mg Rectal  Daily   Or  . aspirin  325 mg Oral Daily  . atorvastatin  80 mg Oral Daily  . clopidogrel  75 mg Oral Daily  . DULoxetine  30 mg Oral Daily  . gabapentin  200 mg Oral BID  . heparin  5,000 Units Subcutaneous Q8H  . pantoprazole  40 mg Oral Daily  . primidone  50 mg Oral QHS   Continuous Infusions: . sodium chloride 75 mL/hr at 03/19/20 0452      LOS: 2 days   Charlynne Cousins  [facility]  03/19/2020, 8:36 AM

## 2020-03-19 NOTE — Progress Notes (Addendum)
Csw called and left message requesting call back to discuss SNF vs Home w/ hospice.   1505: CSW spoke with pt daughter to discuss SNF vs home with hospice. Daughter prefers pt to return home. Pt lives with daughter and has aides come for 2 hours/day  And has hospice services with [facility].   CSW met with pt and confirmed pt's desires to return home with hospice services. TOC will continue to follow for any needs. Pt will likely need PTAR at time of d/c.

## 2020-03-20 ENCOUNTER — Inpatient Hospital Stay (HOSPITAL_COMMUNITY)

## 2020-03-20 DIAGNOSIS — I25119 Atherosclerotic heart disease of native coronary artery with unspecified angina pectoris: Secondary | ICD-10-CM

## 2020-03-20 LAB — CBC
HCT: 36.3 % (ref 36.0–46.0)
Hemoglobin: 11 g/dL — ABNORMAL LOW (ref 12.0–15.0)
MCH: 26.8 pg (ref 26.0–34.0)
MCHC: 30.3 g/dL (ref 30.0–36.0)
MCV: 88.5 fL (ref 80.0–100.0)
Platelets: 236 10*3/uL (ref 150–400)
RBC: 4.1 MIL/uL (ref 3.87–5.11)
RDW: 16.9 % — ABNORMAL HIGH (ref 11.5–15.5)
WBC: 5.5 10*3/uL (ref 4.0–10.5)
nRBC: 0 % (ref 0.0–0.2)

## 2020-03-20 LAB — BASIC METABOLIC PANEL
Anion gap: 11 (ref 5–15)
BUN: 38 mg/dL — ABNORMAL HIGH (ref 8–23)
CO2: 25 mmol/L (ref 22–32)
Calcium: 8.7 mg/dL — ABNORMAL LOW (ref 8.9–10.3)
Chloride: 102 mmol/L (ref 98–111)
Creatinine, Ser: 2.92 mg/dL — ABNORMAL HIGH (ref 0.44–1.00)
GFR, Estimated: 17 mL/min — ABNORMAL LOW (ref >60)
Glucose, Bld: 120 mg/dL — ABNORMAL HIGH (ref 70–99)
Potassium: 3.9 mmol/L (ref 3.5–5.1)
Sodium: 138 mmol/L (ref 135–145)

## 2020-03-20 LAB — MAGNESIUM: Magnesium: 2.1 mg/dL (ref 1.7–2.4)

## 2020-03-20 MED ORDER — TORSEMIDE 20 MG PO TABS
40.0000 mg | ORAL_TABLET | Freq: Every day | ORAL | Status: DC
  Filled 2020-03-20: qty 2

## 2020-03-20 MED ORDER — APIXABAN 2.5 MG PO TABS
2.5000 mg | ORAL_TABLET | Freq: Two times a day (BID) | ORAL | Status: DC
  Administered 2020-03-20 – 2020-03-21 (×3): 2.5 mg via ORAL
  Filled 2020-03-20 (×3): qty 1

## 2020-03-20 MED ORDER — ASPIRIN EC 81 MG PO TBEC
81.0000 mg | DELAYED_RELEASE_TABLET | Freq: Every day | ORAL | Status: DC
  Administered 2020-03-20 – 2020-03-21 (×2): 81 mg via ORAL
  Filled 2020-03-20 (×2): qty 1

## 2020-03-20 MED ORDER — ATORVASTATIN CALCIUM 40 MG PO TABS
40.0000 mg | ORAL_TABLET | Freq: Every day | ORAL | Status: DC
  Administered 2020-03-20 – 2020-03-21 (×2): 40 mg via ORAL
  Filled 2020-03-20 (×2): qty 1

## 2020-03-20 NOTE — Progress Notes (Signed)
STROKE TEAM PROGRESS NOTE   INTERVAL HISTORY No family at bedside.  Patient initially sleeping, easily arousable on arrival.  No acute distress.  No acute event overnight.  Palliative care discussed with daughter [name] yesterday, and she requested short-term medications while limiting medication burden.  However, today I called [name] over the phone, discussed with her about medication, she requested any medications that keep her alive, agree with Eliquis 2.5 mg twice daily and also agree with Lipitor.  OBJECTIVE Vitals:   03/20/20 0230 03/20/20 0235 03/20/20 0300 03/20/20 0410  BP:    124/81  Pulse:    84  Resp:   18 16  Temp:    97.9 F (36.6 C)  TempSrc:    Oral  SpO2: 92% 92% 94% 91%  Weight: 68.4 kg     Height:       CBC:  Recent Labs  Lab 03/15/2020 0926 03/18/20 0311 03/19/20 0236 03/20/20 0254  WBC 6.0   < > 5.1 5.5  NEUTROABS 4.9  --   --   --   HGB 11.7*   < > 11.3* 11.0*  HCT 39.7   < > 37.5 36.3  MCV 90.2   < > 89.3 88.5  PLT 266   < > 228 236   < > = values in this interval not displayed.   Basic Metabolic Panel:  Recent Labs  Lab 03/19/20 0236 03/20/20 0254  NA 139 138  K 4.0 3.9  CL 102 102  CO2 26 25  GLUCOSE 73 120*  BUN 37* 38*  CREATININE 2.54* 2.92*  CALCIUM 8.9 8.7*  MG 2.1 2.1   Lipid Panel:     Component Value Date/Time   CHOL 180 03/18/2020 0729   CHOL 353 (H) 01/09/2019 0959   TRIG 104 03/18/2020 0729   HDL 34 (L) 03/18/2020 0729   HDL 52 01/09/2019 0959   CHOLHDL 5.3 03/18/2020 0729   VLDL 21 03/18/2020 0729   LDLCALC 125 (H) 03/18/2020 0729   LDLCALC 251 (H) 01/09/2019 0959   HgbA1c:  Lab Results  Component Value Date   HGBA1C 6.8 (H) 03/18/2020   Urine Drug Screen:     Component Value Date/Time   LABOPIA NONE DETECTED 03/05/2020 1450   COCAINSCRNUR NONE DETECTED 03/27/2020 1450   LABBENZ NONE DETECTED 04/01/2020 1450   AMPHETMU NONE DETECTED 03/18/2020 1450   THCU NONE DETECTED 03/29/2020 1450   LABBARB NONE  DETECTED 03/10/2020 1450    Alcohol Level No results found for: ETH  IMAGING  CT Angio Head W/Cm &/Or Wo Cm CT Angio Neck W and/or Wo Contrast 03/06/2020 IMPRESSION:  1. No acute intracranial abnormality.  2. Two short segments of occlusion within and M4 segment of the left MCA posterior division branch. This may be secondary to intracranial atherosclerotic disease versus embolic. In case of high clinical suspicion for acute infarct, an MRI of the brain suggested.  3. Severe stenosis of the intracranial right vertebral artery.  4. No hemodynamically significant stenosis in the neck.  5. Bilateral pleural effusion, right greater than left.  6. Bilateral ground-glass opacity of the visualized lungs, mostly on the right side.  7. Aortic atherosclerosis.   CT Head Wo Contrast 03/22/2020 IMPRESSION:  Atrophy with periventricular small vessel disease, stable. No mass or hemorrhage. Multiple foci of arterial vascular calcification. Opacification of multiple mastoid air cells noted. Mucosal thickening in several ethmoid air cells.   MR BRAIN WO CONTRAST 03/18/2020 IMPRESSION:  1. Small, patchy acute infarcts in posterior Left  MCA territory cortex. No associated hemorrhage or mass effect.  2. Underlying advanced signal changes in the cerebral white matter and pons, nonspecific but most commonly due to chronic small vessel disease. Solitary chronic microhemorrhage in the right frontal lobe.  3. Bilateral mastoid air cell effusions, probably postinflammatory.   DG Chest 1 View 03/22/2020 IMPRESSION:  Airspace opacity left lower lobe concerning for potential pneumonia. Small pleural effusions bilaterally. Cardiomegaly. Note that there may be a degree of underlying congestive heart failure.   DG Chest 2 View 03/18/2020 IMPRESSION:  Low lung volumes and persistent opacity at the right lung base, a combination of partially loculated pleural fluid and atelectasis/consolidation. Left lower lobe  lobar pneumonia not excluded given the suggestion of air bronchograms.    DG Knee Complete 4 Views Right 03/13/2020 IMPRESSION:  Areas of osteoarthritic change. No appreciable fracture, dislocation, or joint effusion.    DG HIP UNILAT WITH PELVIS 2-3 VIEWS RIGHT 03/06/2020 IMPRESSION:  No acute fracture or dislocation. Moderate symmetric narrowing each hip joint.   Transthoracic Echocardiogram  1. Left ventricular ejection fraction, by estimation, is 20 to 25%. The  left ventricle has severely decreased function. The left ventricle  demonstrates regional wall motion abnormalities (see scoring  diagram/findings for description). The left  ventricular internal cavity size was mildly dilated. There is akinesis of  the left ventricular, entire anterior wall, anterolateral wall and apical  segment. There is akinesis of the left ventricular, mid-apical lateral  wall, inferolateral wall and  inferior wall. There is akinesis of the left ventricular, basal-mid  anteroseptal wall and inferoseptal wall.  2. Mild mitral valve regurgitation.  3. Large pleural effusion in the left lateral region.  4. The inferior vena cava is normal in size with <50% respiratory  variability, suggesting right atrial pressure of 8 mmHg.  5. Tricuspid regurgitation signal is inadequate for assessing PA  pressure.   ECG - SR rate 87 BPM. (See cardiology reading for complete details)   PHYSICAL EXAM   Temp:  [97.7 F (36.5 C)-98.2 F (36.8 C)] 97.9 F (36.6 C) (12/17 0410) Pulse Rate:  [84-92] 84 (12/17 0410) Resp:  [13-20] 16 (12/17 0410) BP: (118-142)/(76-89) 124/81 (12/17 0410) SpO2:  [87 %-100 %] 91 % (12/17 0410) Weight:  [68.4 kg] 68.4 kg (12/17 0230)  General - Well nourished, well developed, in no apparent distress  Ophthalmologic - fundi not visualized due to noncooperation.  Cardiovascular - Regular rhythm and rate  Neuro - awake alert, eyes open, orientated to age, place and time.  Paraphasic errors and difficult repeating long sentences, but name 3/3. Paucity of speech with hesitancy and word finding difficulty but follows most one step commands, not able to follow two step commands. Tracking bilaterally, blinking to visual threat bilaterally. Mild right nasolabial fold flattening. Tongue midline. BUE at least 3+/5, however, right FTN dysmetria. Left FTN intact although slow. BLE 3/5 proximal and 3+/5 distally. Sensation subjectively symmetrical. Gait not tested.    ASSESSMENT/PLAN Kara Hanson is a 64 y.o. female with history of type 2 DM, HTN, HLD, previous stroke, hx of Covid infection (April 2020), spinal stenosis, NSTEMI, chronic kidney disease stage IV, and CHF (O2 dependent) on home hospice - noncompliant with medications  - (spends most of her time in bed  legs weak from deconditioning) Ms. Purdum was brought to [facility] ED after 2 falls and complaints of a severe headache (poor historian). She did not receive IV t-PA due to late presentation (>4.5 hours from time of onset).  Stroke: small, patchy acute infarcts in posterior Left MCA territory cortex - likely embolic due to low EF  CT head - Atrophy with periventricular small vessel disease, stable. No mass or hemorrhage. Multiple foci of arterial vascular calcification.   MRI head - Small, patchy acute infarcts in posterior Left MCA territory cortex. No associated hemorrhage or mass effect.    CTA H&N -  Two short segments of occlusion within and M4 segment of the left MCA posterior division branch. Severe stenosis of the intracranial right vertebral artery.   2D Echo - EF 20-25%  [employer] Virus 2 - negative  LE Venous Dopplers no DVT  LDL - 125  HgbA1c - 6.8  UDS - negative  VTE prophylaxis - Subcutaneous heparin  No antithrombotic prior to admission, now on aspirin 81 and eliquis 2.5mg  bid. Continue on discharge.  Patient will be counseled to be compliant with her antithrombotic  medications  Ongoing aggressive stroke risk factor management  Therapy recommendations:  SNF  Disposition:  Pending  Cardiomyopathy with low EF  04/2019 EF 40-45%  09/2019 EF 20-25% - cardiology determined that pt end stage heart failure - home hospice  This admission EF 20-25%  Likely the etiology for the current emoblic stroke  Discussed with daughter [name], agree with Eliquis 2.5 mg twice daily (high Cre).   CAD and hx of STEMI  Not on antithrombotics at home for home hospice  Currently on ASA   Continue aspirin 81 at discharge  Follow up with cardiology as outpatient  Hypertension  Home BP meds: Hydralazine  Current BP meds: none  BP stable . Long-term BP goal normotensive  Hyperlipidemia  Home Lipid lowering medication: none   LDL 125, goal < 70  Current lipid lowering medication: Lipitor 40 mg daily   Continue statin on discharge  Diabetes  Home diabetic meds: none  SSI  CBG monitoring  HgbA1c 6.8, goal < 7.0  PCP follow up   Other Stroke Risk Factors  Advanced age  Obesity, Body mass index is 30.46 kg/m., recommend weight loss, diet and exercise as appropriate   Family hx stroke (father)   Other Active Problems, Findings and Recommendations  Code status - DNR on home hospice - Palliative Care consult   CKD - stage 3b - creatinine - 1.82->2.06->2.54  Bilateral pleural effusion, right greater than left.   Bilateral ground-glass opacity of the visualized lungs, mostly on the right side.   Aortic atherosclerosis.    Hospital day # 3  Neurology will sign off. Please call with questions. Pt will follow up with Dr. Carles Collet at [facility] in about 4 weeks. Thanks for the consult.  Rosalin Hawking, MD PhD Stroke Neurology 03/20/2020 8:48 PM    To contact Stroke Continuity provider, please refer to [email or web address] After hours, contact General Neurology

## 2020-03-20 NOTE — Progress Notes (Signed)
   03/20/20 1045  Mobility  Activity Refused mobility (Unspecified)

## 2020-03-20 NOTE — Progress Notes (Signed)
°   03/20/20 0200  Assess: MEWS Score  ECG Heart Rate (!) 117  Resp 15  SpO2 (!) 87 %  O2 Device Nasal Cannula  Patient Activity (if Appropriate) In bed  O2 Flow Rate (L/min) 5 L/min  Assess: MEWS Score  MEWS Temp 0  MEWS Systolic 0  MEWS Pulse 2  MEWS RR 0  MEWS LOC 0  MEWS Score 2  MEWS Score Color Yellow  Assess: if the MEWS score is Yellow or Red  Were vital signs taken at a resting state? Yes  Focused Assessment No change from prior assessment  Early Detection of Sepsis Score *See Row Information* Low  MEWS guidelines implemented *See Row Information* No, previously yellow, continue vital signs every 4 hours  Treat  MEWS Interventions Escalated (See documentation below)  Pain Scale 0-10  Pain Score Asleep  Escalate  MEWS: Escalate Yellow: discuss with charge nurse/RN and consider discussing with provider and RRT  Notify: Charge Nurse/RN  Name of Charge Nurse/RN Notified Jequetta  Date Charge Nurse/RN Notified 03/20/20  Time Charge Nurse/RN Notified 0210  Document  Patient Outcome Other (Comment)  Progress note created (see row info) Yes

## 2020-03-20 NOTE — Progress Notes (Signed)
Pt has not voided for 24 hours. Pt taking Torsemide. Evening bladder scan showed 169 mL urine. Morning bladder scan showed 328 mL urine. Straight cath released 325 mL urine.

## 2020-03-20 NOTE — Progress Notes (Signed)
[facility] PROGRESS NOTE    Progress Note  Kara Hanson  MVV:[id number] DOB: February 23, 1956 DOA: 03/31/2020 PCP: [name], MD     Brief Narrative:   Kara Hanson is an 64 y.o. female past medical history of chronic combined systolic and diastolic heart failure on 4 L supplemental oxygen for COPD, chronic kidney disease stage IV not adherence to her medication, mostly bedbound with severe deconditioning per family's report who has suffered 2 falls over the last 24 hours, comes to the ED for shortness of breath headaches and fall.  She was found afebrile saturating in the 60s chest x-ray was notable for left based air opacity and a small bilateral pleural effusion imaging showed no acute findings except except for CTA neck that showed 2 short segments of occlusion within the left MCA, BNP of 4000, troponins slightly elevated but flat neurology was consulted by the emergency room physician.  She was started on IV Lasix Benadryl Toradol fentanyl Zofran and potassium supplements.  Assessment/Plan:   Acute CVA: MRI was positive for CVA to the left MCA with chronic changes. HgbA1c 6.8, fasting lipid panel HDL < 40, LDL > 70 continue statins Physical therapy evaluated the patient and recommended skilled nursing facility. Speech evaluated the patient the recommended regular thin liquid diet. No events on telemetry. Palliative care met with family they would like to keep her DNR return home with hospice minimize medication just to the ones which will have a large impact in short-term burden.  The family did speak that they want antiplatelet therapy, but will discontinue Lipitor. Hospice to follow-up at home and continue to de-escalate medication Will start on aspirin 81 mg.  Chronic combined systolic and diastolic CHF (congestive heart failure) (HCC) Chest x-ray showing basilar and bilateral pleural effusions, with elevated BNP. These effusions appear to be chronic, she appears  euvolemic on physical exam going back through the chart her weight is unchanged from her estimated dry weight, which is around 63 to 65 kg Decrease her oral Demadex and restart tomorrow, as creatinine is trending up.  Chronic kidney disease stage IV: Creatinine appears to be at baseline around 1.8.  Hypokalemia: Improved now 4.3, will put her on a supplement as she will be on IV Lasix.  Type 2 diabetes mellitus with diabetic neuropathy, unspecified (HCC) Blood glucose fairly controlled hemoglobin A1c 6.8 continue current regimen.   DVT prophylaxis: lovenox Family Communication:none Status is: Inpatient  Remains inpatient appropriate because:Hemodynamically unstable   Dispo: The patient is from: Home              Anticipated d/c is to: SNF              Anticipated d/c date is:1 days              Patient currently is not medically stable to d/c.   Code Status:     Code Status Orders  (From admission, onward)         Start     Ordered   03/24/2020 2223  Do not attempt resuscitation (DNR)  Continuous       Question Answer Comment  In the event of cardiac or respiratory ARREST Do not call a "code blue"   In the event of cardiac or respiratory ARREST Do not perform Intubation, CPR, defibrillation or ACLS   In the event of cardiac or respiratory ARREST Use medication by any route, position, wound care, and other measures to relive pain and suffering. May use oxygen, suction and  manual treatment of airway obstruction as needed for comfort.      03/16/2020 2225        Code Status History    Date Active Date Inactive Code Status Order ID Comments User Context   11/29/2019 1309 12/02/2019 2123 DNR [id number]  Lin Landsman, NP Inpatient   11/27/2019 1701 11/29/2019 1309 Full Code [id number]  Darliss Cheney, MD ED   11/19/2019 0830 11/26/2019 1901 Full Code [id number]  Norval Morton, MD ED   09/26/2019 1506 10/07/2019 2156 Full Code [id number]  Mckinley Jewel, MD ED   05/18/2019 1240  05/23/2019 0153 Full Code [id number]  Lequita Halt, MD ED   04/18/2019 0556 04/20/2019 2128 Full Code [id number]  Shela Leff, MD Inpatient   02/09/2019 0436 02/19/2019 2225 Full Code [id number]  Rise Patience, MD ED   01/23/2019 1757 02/08/2019 0133 Full Code [id number]  Georgette Shell, MD ED   11/01/2018 0029 11/05/2018 2014 Full Code [id number]  Sid Falcon, MD ED   09/24/2018 2303 10/02/2018 1600 Full Code [id number]  Elwyn Reach, MD Inpatient   07/20/2018 1655 07/26/2018 1800 Full Code [id number]  Debbe Odea, MD ED   03/20/2018 0402 03/24/2018 1949 Full Code [id number]  Norval Morton, MD ED   11/27/2017 0705 11/30/2017 1625 Full Code [id number]  Radene Gunning, NP ED   10/01/2016 0033 10/01/2016 1946 Full Code [id number]  Maryellen Pile, MD ED   06/17/2016 1720 06/18/2016 2022 Full Code [id number]  Reyne Dumas, MD ED   04/23/2016 0418 04/26/2016 1716 Full Code [id number]  Rise Patience, MD ED   Advance Care Planning Activity        IV Access:    Peripheral IV   Procedures and diagnostic studies:   VAS [place] LOWER EXTREMITY VENOUS (DVT)  Result Date: 03/18/2020  Lower Venous DVT Study Indications: Stroke.  Risk Factors: None identified. Limitations: Poor ultrasound/tissue interface and patient pain tolerance. Comparison Study: No prior studies. Performing Technologist: Oliver Hum RVT  Examination Guidelines: A complete evaluation includes B-mode imaging, spectral Doppler, color Doppler, and power Doppler as needed of all accessible portions of each vessel. Bilateral testing is considered an integral part of a complete examination. Limited examinations for reoccurring indications may be performed as noted. The reflux portion of the exam is performed with the patient in reverse Trendelenburg.  +---------+---------------+---------+-----------+----------+--------------+ RIGHT    CompressibilityPhasicitySpontaneityPropertiesThrombus Aging  +---------+---------------+---------+-----------+----------+--------------+ CFV      Full           Yes      Yes                                 +---------+---------------+---------+-----------+----------+--------------+ SFJ      Full                                                        +---------+---------------+---------+-----------+----------+--------------+ FV Prox  Full                                                        +---------+---------------+---------+-----------+----------+--------------+ FV Mid  Yes      Yes                                 +---------+---------------+---------+-----------+----------+--------------+ FV Distal               Yes      Yes                                 +---------+---------------+---------+-----------+----------+--------------+ PFV      Full                                                        +---------+---------------+---------+-----------+----------+--------------+ POP      Full           Yes      Yes                                 +---------+---------------+---------+-----------+----------+--------------+ PTV      Full                                                        +---------+---------------+---------+-----------+----------+--------------+ PERO     Full                                                        +---------+---------------+---------+-----------+----------+--------------+   +---------+---------------+---------+-----------+----------+--------------+ LEFT     CompressibilityPhasicitySpontaneityPropertiesThrombus Aging +---------+---------------+---------+-----------+----------+--------------+ CFV      Full           Yes      Yes                                 +---------+---------------+---------+-----------+----------+--------------+ SFJ      Full                                                         +---------+---------------+---------+-----------+----------+--------------+ FV Prox  Full                                                        +---------+---------------+---------+-----------+----------+--------------+ FV Mid                  Yes      Yes                                 +---------+---------------+---------+-----------+----------+--------------+ FV Distal  Yes      Yes                                 +---------+---------------+---------+-----------+----------+--------------+ PFV      Full                                                        +---------+---------------+---------+-----------+----------+--------------+ POP      Full           Yes      Yes                                 +---------+---------------+---------+-----------+----------+--------------+ PTV      Full                                                        +---------+---------------+---------+-----------+----------+--------------+ PERO     Full                                                        +---------+---------------+---------+-----------+----------+--------------+     Summary: RIGHT: - There is no evidence of deep vein thrombosis in the lower extremity. However, portions of this examination were limited- see technologist comments above.  - No cystic structure found in the popliteal fossa.  LEFT: - There is no evidence of deep vein thrombosis in the lower extremity. However, portions of this examination were limited- see technologist comments above.  - No cystic structure found in the popliteal fossa.  *See table(s) above for measurements and observations. Electronically signed by Harold Barban MD on 03/18/2020 at 10:02:38 PM.    Final    ECHOCARDIOGRAM LIMITED  Result Date: 03/18/2020    ECHOCARDIOGRAM LIMITED REPORT   Patient Name:   Kara Hanson Date of Exam: 03/18/2020 Medical Rec #:  [id number]       Height:       59.0 in Accession #:     [id number]      Weight:       145.1 lb Date of Birth:  25-Oct-1955       BSA:          1.609 m Patient Age:    59 years        BP:           122/86 mmHg Patient Gender: F               HR:           121 bpm. Exam Location:  Inpatient Procedure: Limited Echo Indications:    CVA  History:        Patient has prior history of Echocardiogram examinations, most                 recent 09/27/2019. CHF, Previous Myocardial Infarction, Stroke,  Signs/Symptoms:Shortness of Breath and Chest Pain; Risk                 Factors:Diabetes, Hypertension and Dyslipidemia. Pleural                 effusions.  Sonographer:    Dustin Flock Referring Phys: [id number] [place]  Sonographer Comments: Limited echo-HR elevated. IMPRESSIONS  1. Left ventricular ejection fraction, by estimation, is 20 to 25%. The left ventricle has severely decreased function. The left ventricle demonstrates regional wall motion abnormalities (see scoring diagram/findings for description). The left ventricular internal cavity size was mildly dilated. There is akinesis of the left ventricular, entire anterior wall, anterolateral wall and apical segment. There is akinesis of the left ventricular, mid-apical lateral wall, inferolateral wall and inferior wall. There is akinesis of the left ventricular, basal-mid anteroseptal wall and inferoseptal wall.  2. Mild mitral valve regurgitation.  3. Large pleural effusion in the left lateral region.  4. The inferior vena cava is normal in size with <50% respiratory variability, suggesting right atrial pressure of 8 mmHg.  5. Tricuspid regurgitation signal is inadequate for assessing PA pressure. FINDINGS  Left Ventricle: Left ventricular ejection fraction, by estimation, is 20 to 25%. The left ventricle has severely decreased function. The left ventricle demonstrates regional wall motion abnormalities. The left ventricular internal cavity size was mildly  dilated. Right Ventricle: Tricuspid  regurgitation signal is inadequate for assessing PA pressure. Mitral Valve: Mild mitral valve regurgitation. Tricuspid Valve: Tricuspid valve regurgitation is mild. Venous: The inferior vena cava is normal in size with less than 50% respiratory variability, suggesting right atrial pressure of 8 mmHg. Additional Comments: There is a large pleural effusion in the left lateral region. LEFT VENTRICLE PLAX 2D LVIDd:         5.40 cm LVIDs:         4.90 cm LV PW:         1.20 cm LV IVS:        0.90 cm  LV Volumes (MOD) LV vol d, MOD A4C: 147.0 ml LV vol s, MOD A4C: 117.0 ml LV SV MOD A4C:     147.0 ml LEFT ATRIUM         Index LA diam:    3.70 cm 2.30 cm/m Fransico Him MD Electronically signed by Fransico Him MD Signature Date/Time: 03/18/2020/3:57:57 PM    Final      Medical Consultants:    None.  Anti-Infectives:   None  Subjective:    Kara Hanson has no new complaints this morning.  Objective:    Vitals:   03/20/20 0410 03/20/20 0500 03/20/20 0600 03/20/20 0737  BP: 124/81   127/83  Pulse: 84   87  Resp: 16   15  Temp: 97.9 F (36.6 C)   97.7 F (36.5 C)  TempSrc: Oral   Oral  SpO2: 91% 90% 94% 91%  Weight:      Height:       SpO2: 91 % O2 Flow Rate (L/min): 5 L/min   Intake/Output Summary (Last 24 hours) at 03/20/2020 0835 Last data filed at 03/20/2020 4540 Gross per 24 hour  Intake 240 ml  Output 325 ml  Net -85 ml   Filed Weights   03/18/20 0100 03/19/20 0114 03/20/20 0230  Weight: 65.8 kg 66.8 kg 68.4 kg    Exam: General exam: In no acute distress. Respiratory system: Good air movement and clear to auscultation. Cardiovascular system: S1 & S2 heard, RRR. No JVD.  Gastrointestinal system: Abdomen is nondistended, soft and nontender.  Extremities: No pedal edema. Skin: No rashes, lesions or ulcers Psychiatry: Judgement and insight appear normal. Mood & affect appropriate.  Data Reviewed:    Labs: Basic Metabolic Panel: Recent Labs  Lab 03/14/2020 0926  03/11/2020 1458 03/18/20 0311 03/19/20 0236 03/20/20 0254  NA 141  --  139 139 138  K 3.2*  --  4.3 4.0 3.9  CL 100  --  102 102 102  CO2 25  --  24 26 25   GLUCOSE 103*  --  121* 73 120*  BUN 35*  --  38* 37* 38*  CREATININE 1.82*  --  2.06* 2.54* 2.92*  CALCIUM 9.3  --  8.9 8.9 8.7*  MG  --  1.8 2.3 2.1 2.1   GFR Estimated Creatinine Clearance: 16.2 mL/min (A) (by C-G formula based on SCr of 2.92 mg/dL (H)). Liver Function Tests: Recent Labs  Lab 03/16/2020 0926  AST 10*  ALT 7  ALKPHOS 106  BILITOT 0.8  PROT 5.7*  ALBUMIN 2.6*   Recent Labs  Lab 03/31/2020 0926  LIPASE 15   No results for input(s): AMMONIA in the last 168 hours. Coagulation profile No results for input(s): INR, PROTIME in the last 168 hours. COVID-19 Labs  No results for input(s): DDIMER, FERRITIN, LDH, CRP in the last 72 hours.  Lab Results  Component Value Date   SARSCOV2NAA NEGATIVE 03/29/2020   SARSCOV2NAA NEGATIVE 11/27/2019   SARSCOV2NAA NEGATIVE 11/19/2019   [place] NEGATIVE 09/26/2019    CBC: Recent Labs  Lab 03/25/2020 0926 03/18/20 0311 03/19/20 0236 03/20/20 0254  WBC 6.0 6.3 5.1 5.5  NEUTROABS 4.9  --   --   --   HGB 11.7* 11.3* 11.3* 11.0*  HCT 39.7 37.3 37.5 36.3  MCV 90.2 88.8 89.3 88.5  PLT 266 218 228 236   Cardiac Enzymes: No results for input(s): CKTOTAL, CKMB, CKMBINDEX, TROPONINI in the last 168 hours. BNP (last 3 results) No results for input(s): PROBNP in the last 8760 hours. CBG: Recent Labs  Lab 03/16/2020 1706  GLUCAP 85   D-Dimer: No results for input(s): DDIMER in the last 72 hours. Hgb A1c: Recent Labs    03/18/20 0729  HGBA1C 6.8*   Lipid Profile: Recent Labs    03/18/20 0729  CHOL 180  HDL 34*  LDLCALC 125*  TRIG 104  CHOLHDL 5.3   Thyroid function studies: Recent Labs    03/18/20 0729  TSH 3.311   Anemia work up: No results for input(s): VITAMINB12, FOLATE, FERRITIN, TIBC, IRON, RETICCTPCT in the last 72 hours. Sepsis  Labs: Recent Labs  Lab 04/02/2020 0926 03/11/2020 1458 03/18/20 0311 03/19/20 0236 03/20/20 0254  PROCALCITON  --  <0.10 <0.10 0.16  --   WBC 6.0  --  6.3 5.1 5.5   Microbiology Recent Results (from the past 240 hour(s))  Urine culture     Status: None   Collection Time: 03/30/2020  9:51 AM   Specimen: Urine, Random  Result Value Ref Range Status   Specimen Description URINE, RANDOM  Final   Special Requests NONE  Final   Culture   Final    NO GROWTH Performed at [facility], [place] [address], [place], [place] [address]    Report Status 03/18/2020 FINAL  Final  Resp Panel by RT-PCR (Flu A&B, Covid) Nasopharyngeal Swab     Status: None   Collection Time: 03/23/2020 10:34 AM   Specimen: Nasopharyngeal Swab; Nasopharyngeal(NP) swabs in vial  transport medium  Result Value Ref Range Status   SARS Coronavirus 2 by RT PCR NEGATIVE NEGATIVE Final    Comment: (NOTE) SARS-CoV-2 target nucleic acids are NOT DETECTED.  The SARS-CoV-2 RNA is generally detectable in upper respiratory specimens during the acute phase of infection. The lowest concentration of SARS-CoV-2 viral copies this assay can detect is 138 copies/mL. A negative result does not preclude SARS-Cov-2 infection and should not be used as the sole basis for treatment or other patient management decisions. A negative result may occur with  improper specimen collection/handling, submission of specimen other than nasopharyngeal swab, presence of viral mutation(s) within the areas targeted by this assay, and inadequate number of viral copies(<138 copies/mL). A negative result must be combined with clinical observations, patient history, and epidemiological information. The expected result is Negative.  Fact Sheet for Patients:  EntrepreneurPulse.com.au  Fact Sheet for Healthcare Providers:  IncredibleEmployment.be  This test is no t yet approved or cleared by the [place] FDA and   has been authorized for detection and/or diagnosis of SARS-CoV-2 by FDA under an Emergency Use Authorization (EUA). This EUA will remain  in effect (meaning this test can be used) for the duration of the COVID-19 declaration under Section 564(b)(1) of the Act, 21 U.S.C.section 360bbb-3(b)(1), unless the authorization is terminated  or revoked sooner.       Influenza A by PCR NEGATIVE NEGATIVE Final   Influenza B by PCR NEGATIVE NEGATIVE Final    Comment: (NOTE) The Xpert Xpress SARS-CoV-2/FLU/RSV plus assay is intended as an aid in the diagnosis of influenza from Nasopharyngeal swab specimens and should not be used as a sole basis for treatment. Nasal washings and aspirates are unacceptable for Xpert Xpress SARS-CoV-2/FLU/RSV testing.  Fact Sheet for Patients: EntrepreneurPulse.com.au  Fact Sheet for Healthcare Providers: IncredibleEmployment.be  This test is not yet approved or cleared by the [place] FDA and has been authorized for detection and/or diagnosis of SARS-CoV-2 by FDA under an Emergency Use Authorization (EUA). This EUA will remain in effect (meaning this test can be used) for the duration of the COVID-19 declaration under Section 564(b)(1) of the Act, 21 U.S.C. section 360bbb-3(b)(1), unless the authorization is terminated or revoked.  Performed at [facility], [place] [address] [place], [place] [address]      Medications:   .  stroke: mapping our early stages of recovery book   Does not apply Once  . allopurinol  200 mg Oral QHS  . aspirin  300 mg Rectal Daily   Or  . aspirin  325 mg Oral Daily  . clopidogrel  75 mg Oral Daily  . DULoxetine  30 mg Oral Daily  . gabapentin  200 mg Oral BID  . heparin  5,000 Units Subcutaneous Q8H  . pantoprazole  40 mg Oral Daily  . primidone  50 mg Oral QHS  . torsemide  40 mg Oral BID   Continuous Infusions:     LOS: 3 days   Charlynne Cousins  [facility]  03/20/2020, 8:35 AM

## 2020-03-20 NOTE — Progress Notes (Signed)
ReDS Clip Diuretic Study Pt study # [id number]  Your patient is in the Blinded arm of the ReDS Clip Diuretic study.  Your patient has had a ReDS reading and the reading has been transmitted to the cloud.   Thank You   The research team   Kerby Nora, PharmD, BCPS Heart Failure Stewardship Pharmacist Phone [phone]  Please check [email or web address] for unit-specific pharmacist phone numbers

## 2020-03-20 NOTE — Progress Notes (Signed)
Pt's O2 sat dropped to 80% twice while on 6L high flow [place]. Pt asleep. Rhonchi auscultated. Pt does not endorse shortness of breath. Daytime RN endorsed that the pt's O2 sat had been dropping to 70s before high flow [place] replaced from [place]. MD paged.

## 2020-03-21 LAB — CBC WITH DIFFERENTIAL/PLATELET
Abs Immature Granulocytes: 0.02 10*3/uL (ref 0.00–0.07)
Basophils Absolute: 0 10*3/uL (ref 0.0–0.1)
Basophils Relative: 0 %
Eosinophils Absolute: 0.1 10*3/uL (ref 0.0–0.5)
Eosinophils Relative: 2 %
HCT: 38.3 % (ref 36.0–46.0)
Hemoglobin: 11.5 g/dL — ABNORMAL LOW (ref 12.0–15.0)
Immature Granulocytes: 0 %
Lymphocytes Relative: 14 %
Lymphs Abs: 0.7 10*3/uL (ref 0.7–4.0)
MCH: 26.8 pg (ref 26.0–34.0)
MCHC: 30 g/dL (ref 30.0–36.0)
MCV: 89.3 fL (ref 80.0–100.0)
Monocytes Absolute: 0.5 10*3/uL (ref 0.1–1.0)
Monocytes Relative: 10 %
Neutro Abs: 3.7 10*3/uL (ref 1.7–7.7)
Neutrophils Relative %: 74 %
Platelets: 257 10*3/uL (ref 150–400)
RBC: 4.29 MIL/uL (ref 3.87–5.11)
RDW: 16.9 % — ABNORMAL HIGH (ref 11.5–15.5)
WBC: 5.1 10*3/uL (ref 4.0–10.5)
nRBC: 0 % (ref 0.0–0.2)

## 2020-03-21 LAB — HIV ANTIBODY (ROUTINE TESTING W REFLEX): HIV Screen 4th Generation wRfx: NONREACTIVE

## 2020-03-21 LAB — BASIC METABOLIC PANEL
Anion gap: 10 (ref 5–15)
BUN: 39 mg/dL — ABNORMAL HIGH (ref 8–23)
CO2: 26 mmol/L (ref 22–32)
Calcium: 8.9 mg/dL (ref 8.9–10.3)
Chloride: 102 mmol/L (ref 98–111)
Creatinine, Ser: 3.33 mg/dL — ABNORMAL HIGH (ref 0.44–1.00)
GFR, Estimated: 15 mL/min — ABNORMAL LOW (ref >60)
Glucose, Bld: 97 mg/dL (ref 70–99)
Potassium: 4.3 mmol/L (ref 3.5–5.1)
Sodium: 138 mmol/L (ref 135–145)

## 2020-03-21 LAB — BRAIN NATRIURETIC PEPTIDE: B Natriuretic Peptide: 3953.7 pg/mL — ABNORMAL HIGH (ref 0.0–100.0)

## 2020-03-21 LAB — PROCALCITONIN: Procalcitonin: <0.1 ng/mL

## 2020-03-21 MED ORDER — SODIUM CHLORIDE 0.9 % IV SOLN: 500.0000 mg | INTRAVENOUS | Status: DC

## 2020-03-21 MED ORDER — TORSEMIDE 20 MG PO TABS
40.0000 mg | ORAL_TABLET | Freq: Two times a day (BID) | ORAL | Status: DC
  Administered 2020-03-21 – 2020-03-22 (×3): 40 mg via ORAL
  Filled 2020-03-21 (×2): qty 2

## 2020-03-21 MED ORDER — SODIUM CHLORIDE 0.9 % IV SOLN: 2.0000 g | INTRAVENOUS | Status: DC

## 2020-03-21 MED ORDER — GABAPENTIN 100 MG PO CAPS
100.0000 mg | ORAL_CAPSULE | Freq: Two times a day (BID) | ORAL | Status: DC
  Administered 2020-03-21: 22:00:00 100 mg via ORAL
  Filled 2020-03-21: qty 1

## 2020-03-21 NOTE — Progress Notes (Addendum)
[facility] PROGRESS NOTE    Progress Note  Kara Hanson  RFF:[id number] DOB: Feb 04, 1956 DOA: 03/26/2020 PCP: [name], MD     Brief Narrative:   Kara Hanson is an 64 y.o. female past medical history of chronic combined systolic and diastolic heart failure on 4 L supplemental oxygen for COPD, chronic kidney disease stage IV not adherence to her medication, mostly bedbound with severe deconditioning per family's report who has suffered 2 falls over the last 24 hours, comes to the ED for shortness of breath headaches and fall.  She was found afebrile saturating in the 60s chest x-ray was notable for left based air opacity and a small bilateral pleural effusion imaging showed no acute findings except except for CTA neck that showed 2 short segments of occlusion within the left MCA, BNP of 4000, troponins slightly elevated but flat neurology was consulted by the emergency room physician.  She was started on IV Lasix Benadryl Toradol fentanyl Zofran and potassium supplements.  Assessment/Plan:   Acute CVA: MRI was positive for CVA to the left MCA with chronic changes. HgbA1c 6.8, fasting lipid panel HDL < 40, LDL > 70 continue statins Physical therapy evaluated the patient and recommended skilled nursing facility. Speech evaluated the patient the recommended regular thin liquid diet. No events on telemetry. The daughter now wants to do aspirin and anticoagulation. Neurology on board, physical therapy evaluated the patient recommended skilled nursing facility.  Acute respiratory failure with hypoxia: Of unclear etiology question acute decompensated heart failure versus infectious etiology. Chest x-ray shows bilateral pulmonary infiltrates her creatinine is trending up question cardiorenal syndrome she was restarted on her Lasix I discussed this with the daughter that this may continue to affect her renal function. We will check a BNP, procalcitonin and CBC with  differential. I did speak to the daughter today and she relates she just wants to get her through the holidays as her dad died around Christmas she knows her mom is not doing good and she has been deteriorating but is just hard around this time of the year. She agreed to increase her Demadex despite knowing it could affect her kidneys.  Chronic combined systolic and diastolic CHF (congestive heart failure) (HCC) Chest x-ray showing basilar and bilateral pleural effusions, with elevated BNP. These effusions appear to be chronic, she appears euvolemic on physical exam going back through the chart her weight is unchanged from her estimated dry weight, which is around 63 to 65 kg Decrease her oral Demadex and restart tomorrow, as creatinine is trending up.  Acute Kidney injury on chronic kidney disease stage IV/cardiorenal syndrome: Worsening renal function 3.3 despite diuretic therapy with poor urine output. Likely due to cardiorenal syndrome see above for further details.  Hypokalemia: Improved now 4.3, will put her on a supplement as she will be on IV Lasix.  Type 2 diabetes mellitus with diabetic neuropathy, unspecified (HCC) Blood glucose fairly controlled hemoglobin A1c 6.8 continue current regimen.   DVT prophylaxis: lovenox Family Communication:none Status is: Inpatient  Remains inpatient appropriate because:Hemodynamically unstable   Dispo: The patient is from: Home              Anticipated d/c is to: SNF              Anticipated d/c date is: >3 days              Patient currently is not medically stable to d/c.  Patient will probably not survive this hospital stay.   Code  Status:     Code Status Orders  (From admission, onward)         Start     Ordered   03/24/2020 2223  Do not attempt resuscitation (DNR)  Continuous       Question Answer Comment  In the event of cardiac or respiratory ARREST Do not call a "code blue"   In the event of cardiac or respiratory ARREST  Do not perform Intubation, CPR, defibrillation or ACLS   In the event of cardiac or respiratory ARREST Use medication by any route, position, wound care, and other measures to relive pain and suffering. May use oxygen, suction and manual treatment of airway obstruction as needed for comfort.      03/11/2020 2225        Code Status History    Date Active Date Inactive Code Status Order ID Comments User Context   11/29/2019 1309 12/02/2019 2123 DNR [id number]  Lin Landsman, NP Inpatient   11/27/2019 1701 11/29/2019 1309 Full Code [id number]  Darliss Cheney, MD ED   11/19/2019 0830 11/26/2019 1901 Full Code [id number]  Norval Morton, MD ED   09/26/2019 1506 10/07/2019 2156 Full Code [id number]  Mckinley Jewel, MD ED   05/18/2019 1240 05/23/2019 0153 Full Code [id number]  Lequita Halt, MD ED   04/18/2019 0556 04/20/2019 2128 Full Code [id number]  Shela Leff, MD Inpatient   02/09/2019 0436 02/19/2019 2225 Full Code [id number]  Rise Patience, MD ED   01/23/2019 1757 02/08/2019 0133 Full Code [id number]  Georgette Shell, MD ED   11/01/2018 0029 11/05/2018 2014 Full Code [id number]  Sid Falcon, MD ED   09/24/2018 2303 10/02/2018 1600 Full Code [id number]  Elwyn Reach, MD Inpatient   07/20/2018 1655 07/26/2018 1800 Full Code [id number]  Debbe Odea, MD ED   03/20/2018 0402 03/24/2018 1949 Full Code [id number]  Norval Morton, MD ED   11/27/2017 0705 11/30/2017 1625 Full Code [id number]  Radene Gunning, NP ED   10/01/2016 0033 10/01/2016 1946 Full Code [id number]  Maryellen Pile, MD ED   06/17/2016 1720 06/18/2016 2022 Full Code [id number]  Reyne Dumas, MD ED   04/23/2016 0418 04/26/2016 1716 Full Code [id number]  Rise Patience, MD ED   Advance Care Planning Activity        IV Access:    Peripheral IV   Procedures and diagnostic studies:   DG CHEST PORT 1 VIEW  Result Date: 03/20/2020 CLINICAL DATA:  Acute on chronic respiratory failure with hypoxia. EXAM: PORTABLE CHEST 1 VIEW  COMPARISON:  Radiograph 03/18/2020. Most recent CT 01/31/2019 FINDINGS: Improving lung volumes. Persistent but improving pleural effusions. Pulmonary edema appears similar. Cardiomegaly is similar. No new or progressive airspace disease. No pneumothorax. No acute osseous abnormalities are seen. IMPRESSION: 1. Improving lung volumes. Persistent but improving pleural effusions. 2. Similar pulmonary edema and cardiomegaly. Electronically Signed   By: Keith Rake M.D.   On: 03/20/2020 23:00     Medical Consultants:    None.  Anti-Infectives:   None  Subjective:    Kara Hanson she relates her breathing is not good she is not hungry this morning.  Objective:    Vitals:   03/21/20 0359 03/21/20 0400 03/21/20 0433 03/21/20 0500  BP:      Pulse:      Resp:      Temp:      TempSrc:      SpO2: (!) 83% (!) 80% 90% (!) 88%  Weight:  Height:       SpO2: (!) 88 % O2 Flow Rate (L/min): 7 L/min   Intake/Output Summary (Last 24 hours) at 03/21/2020 0820 Last data filed at 03/20/2020 1720 Gross per 24 hour  Intake 240 ml  Output --  Net 240 ml   Filed Weights   03/19/20 0114 03/20/20 0230 03/21/20 0330  Weight: 66.8 kg 68.4 kg 69.2 kg    Exam: General exam: In no acute distress. Respiratory system: Good air movement and diffuse crackles bilateral at bases Cardiovascular system: S1 & S2 heard, RRR.+ JVD. Gastrointestinal system: Abdomen is nondistended, soft and nontender.  Extremities: No pedal edema. Skin: No rashes, lesions or ulcers Psychiatry: Judgement and insight appear normal. Mood & affect appropriate.  Data Reviewed:    Labs: Basic Metabolic Panel: Recent Labs  Lab 03/16/2020 0926 04/02/2020 1458 03/18/20 0311 03/19/20 0236 03/20/20 0254 03/21/20 0334  NA 141  --  139 139 138 138  K 3.2*  --  4.3 4.0 3.9 4.3  CL 100  --  102 102 102 102  CO2 25  --  24 26 25 26   GLUCOSE 103*  --  121* 73 120* 97  BUN 35*  --  38* 37* 38* 39*  CREATININE 1.82*   --  2.06* 2.54* 2.92* 3.33*  CALCIUM 9.3  --  8.9 8.9 8.7* 8.9  MG  --  1.8 2.3 2.1 2.1  --    GFR Estimated Creatinine Clearance: 14.3 mL/min (A) (by C-G formula based on SCr of 3.33 mg/dL (H)). Liver Function Tests: Recent Labs  Lab 03/31/2020 0926  AST 10*  ALT 7  ALKPHOS 106  BILITOT 0.8  PROT 5.7*  ALBUMIN 2.6*   Recent Labs  Lab 03/12/2020 0926  LIPASE 15   No results for input(s): AMMONIA in the last 168 hours. Coagulation profile No results for input(s): INR, PROTIME in the last 168 hours. COVID-19 Labs  No results for input(s): DDIMER, FERRITIN, LDH, CRP in the last 72 hours.  Lab Results  Component Value Date   SARSCOV2NAA NEGATIVE 03/30/2020   SARSCOV2NAA NEGATIVE 11/27/2019   SARSCOV2NAA NEGATIVE 11/19/2019   [place] NEGATIVE 09/26/2019    CBC: Recent Labs  Lab 03/07/2020 0926 03/18/20 0311 03/19/20 0236 03/20/20 0254  WBC 6.0 6.3 5.1 5.5  NEUTROABS 4.9  --   --   --   HGB 11.7* 11.3* 11.3* 11.0*  HCT 39.7 37.3 37.5 36.3  MCV 90.2 88.8 89.3 88.5  PLT 266 218 228 236   Cardiac Enzymes: No results for input(s): CKTOTAL, CKMB, CKMBINDEX, TROPONINI in the last 168 hours. BNP (last 3 results) No results for input(s): PROBNP in the last 8760 hours. CBG: Recent Labs  Lab 03/16/2020 1706  GLUCAP 85   D-Dimer: No results for input(s): DDIMER in the last 72 hours. Hgb A1c: No results for input(s): HGBA1C in the last 72 hours. Lipid Profile: No results for input(s): CHOL, HDL, LDLCALC, TRIG, CHOLHDL, LDLDIRECT in the last 72 hours. Thyroid function studies: No results for input(s): TSH, T4TOTAL, T3FREE, THYROIDAB in the last 72 hours.  Invalid input(s): FREET3 Anemia work up: No results for input(s): VITAMINB12, FOLATE, FERRITIN, TIBC, IRON, RETICCTPCT in the last 72 hours. Sepsis Labs: Recent Labs  Lab 03/29/2020 0926 03/26/2020 1458 03/18/20 0311 03/19/20 0236 03/20/20 0254  PROCALCITON  --  <0.10 <0.10 0.16  --   WBC 6.0  --  6.3 5.1 5.5    Microbiology Recent Results (from the past 240 hour(s))  Urine culture  Status: None   Collection Time: 03/19/2020  9:51 AM   Specimen: Urine, Random  Result Value Ref Range Status   Specimen Description URINE, RANDOM  Final   Special Requests NONE  Final   Culture   Final    NO GROWTH Performed at [facility], [address] [place], [place] [address]    Report Status 03/18/2020 FINAL  Final  Resp Panel by RT-PCR (Flu A&B, Covid) Nasopharyngeal Swab     Status: None   Collection Time: 03/05/2020 10:34 AM   Specimen: Nasopharyngeal Swab; Nasopharyngeal(NP) swabs in vial transport medium  Result Value Ref Range Status   SARS Coronavirus 2 by RT PCR NEGATIVE NEGATIVE Final    Comment: (NOTE) SARS-CoV-2 target nucleic acids are NOT DETECTED.  The SARS-CoV-2 RNA is generally detectable in upper respiratory specimens during the acute phase of infection. The lowest concentration of SARS-CoV-2 viral copies this assay can detect is 138 copies/mL. A negative result does not preclude SARS-Cov-2 infection and should not be used as the sole basis for treatment or other patient management decisions. A negative result may occur with  improper specimen collection/handling, submission of specimen other than nasopharyngeal swab, presence of viral mutation(s) within the areas targeted by this assay, and inadequate number of viral copies(<138 copies/mL). A negative result must be combined with clinical observations, patient history, and epidemiological information. The expected result is Negative.  Fact Sheet for Patients:  EntrepreneurPulse.com.au  Fact Sheet for Healthcare Providers:  IncredibleEmployment.be  This test is no t yet approved or cleared by the [place] FDA and  has been authorized for detection and/or diagnosis of SARS-CoV-2 by FDA under an Emergency Use Authorization (EUA). This EUA will remain  in effect (meaning this test  can be used) for the duration of the COVID-19 declaration under Section 564(b)(1) of the Act, 21 U.S.C.section 360bbb-3(b)(1), unless the authorization is terminated  or revoked sooner.       Influenza A by PCR NEGATIVE NEGATIVE Final   Influenza B by PCR NEGATIVE NEGATIVE Final    Comment: (NOTE) The Xpert Xpress SARS-CoV-2/FLU/RSV plus assay is intended as an aid in the diagnosis of influenza from Nasopharyngeal swab specimens and should not be used as a sole basis for treatment. Nasal washings and aspirates are unacceptable for Xpert Xpress SARS-CoV-2/FLU/RSV testing.  Fact Sheet for Patients: EntrepreneurPulse.com.au  Fact Sheet for Healthcare Providers: IncredibleEmployment.be  This test is not yet approved or cleared by the [place] FDA and has been authorized for detection and/or diagnosis of SARS-CoV-2 by FDA under an Emergency Use Authorization (EUA). This EUA will remain in effect (meaning this test can be used) for the duration of the COVID-19 declaration under Section 564(b)(1) of the Act, 21 U.S.C. section 360bbb-3(b)(1), unless the authorization is terminated or revoked.  Performed at [facility], [place] [address] [place], [place] [address]      Medications:   .  stroke: mapping our early stages of recovery book   Does not apply Once  . allopurinol  200 mg Oral QHS  . apixaban  2.5 mg Oral BID  . aspirin EC  81 mg Oral Daily  . atorvastatin  40 mg Oral QHS  . DULoxetine  30 mg Oral Daily  . gabapentin  200 mg Oral BID  . primidone  50 mg Oral QHS  . torsemide  40 mg Oral Daily   Continuous Infusions:     LOS: 4 days   Charlynne Cousins  [facility]  03/21/2020, 8:20 AM

## 2020-03-21 NOTE — Progress Notes (Signed)
Advanced HF Team Note  Chart reviewed. Case d/w Dr. Venetia Constable. Kara Hanson well known to [place] from previous admissions.   As stated in my last note, she has end-stage HF and not a candidate for advanced therapies due to her renal failure and other comorbidities. Unfortunately, we have little left to offer her that would meaningfully improve her outcome.   Can try short-term milrinone at 0.125 mcg/kg/min for short-term palliation if desired.   Kara Bickers, MD  11:31 AM

## 2020-03-21 NOTE — Progress Notes (Signed)
   03/21/20 1934  Assess: MEWS Score  Temp 98.4 F (36.9 C)  BP (!) 130/95  Pulse Rate (!) 102  ECG Heart Rate (!) 105  Resp (!) 22  SpO2 90 %  Assess: MEWS Score  MEWS Temp 0  MEWS Systolic 0  MEWS Pulse 1  MEWS RR 1  MEWS LOC 0  MEWS Score 2  MEWS Score Color Yellow  Assess: if the MEWS score is Yellow or Red  Were vital signs taken at a resting state? Yes  Focused Assessment No change from prior assessment  Early Detection of Sepsis Score *See Row Information* Low  MEWS guidelines implemented *See Row Information* No, previously yellow, continue vital signs every 4 hours (pt has had low sats, hx of COPD, pt on high flow O2)  Document  Patient Outcome Stabilized after interventions  Progress note created (see row info) Yes

## 2020-03-22 DIAGNOSIS — Z66 Do not resuscitate: Secondary | ICD-10-CM

## 2020-03-22 LAB — PROCALCITONIN: Procalcitonin: 0.13 ng/mL

## 2020-03-22 LAB — BASIC METABOLIC PANEL
Anion gap: 15 (ref 5–15)
BUN: 46 mg/dL — ABNORMAL HIGH (ref 8–23)
CO2: 19 mmol/L — ABNORMAL LOW (ref 22–32)
Calcium: 8.9 mg/dL (ref 8.9–10.3)
Chloride: 100 mmol/L (ref 98–111)
Creatinine, Ser: 3.63 mg/dL — ABNORMAL HIGH (ref 0.44–1.00)
GFR, Estimated: 13 mL/min — ABNORMAL LOW (ref >60)
Glucose, Bld: 132 mg/dL — ABNORMAL HIGH (ref 70–99)
Potassium: 5.4 mmol/L — ABNORMAL HIGH (ref 3.5–5.1)
Sodium: 134 mmol/L — ABNORMAL LOW (ref 135–145)

## 2020-03-22 MED ORDER — MORPHINE SULFATE (PF) 4 MG/ML IV SOLN: 4.0000 mg | INTRAVENOUS | Status: DC | PRN

## 2020-03-22 MED ORDER — LORAZEPAM 2 MG/ML IJ SOLN: 0.5000 mg | INTRAMUSCULAR | Status: DC | PRN

## 2020-03-22 MED ORDER — TORSEMIDE 100 MG PO TABS: 100.0000 mg | ORAL_TABLET | Freq: Two times a day (BID) | ORAL | Status: DC

## 2020-03-22 MED ORDER — BISACODYL 10 MG RE SUPP: 10.0000 mg | Freq: Every day | RECTAL | Status: DC | PRN

## 2020-03-22 MED ORDER — METOPROLOL TARTRATE 25 MG PO TABS: 25.0000 mg | ORAL_TABLET | Freq: Two times a day (BID) | ORAL | Status: DC

## 2020-03-22 MED ORDER — MORPHINE SULFATE (PF) 2 MG/ML IV SOLN: 2.0000 mg | INTRAVENOUS | Status: DC | PRN

## 2020-03-22 MED ORDER — GLYCOPYRROLATE 0.2 MG/ML IJ SOLN: 0.4000 mg | INTRAMUSCULAR | Status: DC | PRN

## 2020-03-22 MED ORDER — MORPHINE SULFATE 10 MG/5ML PO SOLN: 2.5000 mg | ORAL | Status: DC | PRN

## 2020-03-22 NOTE — Plan of Care (Signed)
  Problem: Pain Managment: Goal: General experience of comfort will improve Outcome: Completed/Met

## 2020-03-22 NOTE — Progress Notes (Addendum)
[facility] PROGRESS NOTE    Progress Note  Kara Hanson  RDE:[id number] DOB: 05-05-1955 DOA: 03/26/2020 PCP: [name], MD     Brief Narrative:   Kara Hanson is an 64 y.o. female past medical history of chronic combined systolic and diastolic heart failure on 4 L supplemental oxygen for COPD, chronic kidney disease stage IV not adherence to her medication, mostly bedbound with severe deconditioning per family's report who has suffered 2 falls over the last 24 hours, comes to the ED for shortness of breath headaches and fall.  She was found afebrile saturating in the 60s chest x-ray was notable for left based air opacity and a small bilateral pleural effusion imaging showed no acute findings except except for CTA neck that showed 2 short segments of occlusion within the left MCA, BNP of 4000, troponins slightly elevated but flat neurology was consulted by the emergency room physician.  She was started on IV Lasix Benadryl Toradol fentanyl Zofran and potassium supplements.  Assessment/Plan:   Acute CVA: MRI was positive for CVA to the left MCA with chronic changes. After having a long discussion with discussion with the family about her poor prognosis due to pump failure and the fact that the patient is developing acute kidney injury despite diuresing and being fluid overloaded.  They relate that they wanted to take the patient home with comfort measures and hospice. We will reconsult palliative care to help arrange transitioning to home.  Acute respiratory failure with hypoxia acute combined systolic and diastolic heart failure:: Chest x-ray shows bilateral pulmonary infiltrates her creatinine is trending up question cardiorenal syndrome she was restarted on her Lasix I discussed this with the daughter that this may continue to affect her renal function. Procalcitonin is low, she has remained afebrile. I did speak to the advanced heart failure team if they had seen her  recently about 3 months ago and they related that she is not a candidate for advanced therapy due to her renal failure and multiple comorbidities and unfortunate there is little left to offer for meaningful recovery. After explaining to the daughter her renal function was deteriorating, she was becoming fluid overloaded despite diuretic sand  fact that she is becoming hypotensive and tachycardic and she is not responding to treatment makes her outcome extremely poor. The daughter understands and she relates she would want her home for Christmas, will reinvolve palliative care to see how we could transition her home with comfort measures in the setting of cardiorenal syndrome and pulmonary edema. We will go ahead and increase her torsemide I explained to the daughter that this will probably lead to worsening renal function, but she agrees that if it will make her breathing not get worse she would want this.  Acute Kidney injury on chronic kidney disease stage IV/cardiorenal syndrome: Her creatinine is worsening likely due to cardiorenal syndrome. She is not a candidate for advanced therapy. This was explained to family see above for further details.  Hypokalemia: Improved now 4.3, will put her on a supplement as she will be on IV Lasix.  Type 2 diabetes mellitus with diabetic neuropathy, unspecified (HCC) Blood glucose fairly controlled hemoglobin A1c 6.8 continue current regimen.   DVT prophylaxis: lovenox Family Communication:none Status is: Inpatient  Remains inpatient appropriate because:Hemodynamically unstable   Dispo: The patient is from: Home              Anticipated d/c is to: SNF  Anticipated d/c date is: >3 days              Patient currently is not medically stable to d/c.  Patient will probably not survive this hospital stay.   Code Status:     Code Status Orders  (From admission, onward)         Start     Ordered   03/31/2020 2223  Do not attempt  resuscitation (DNR)  Continuous       Question Answer Comment  In the event of cardiac or respiratory ARREST Do not call a "code blue"   In the event of cardiac or respiratory ARREST Do not perform Intubation, CPR, defibrillation or ACLS   In the event of cardiac or respiratory ARREST Use medication by any route, position, wound care, and other measures to relive pain and suffering. May use oxygen, suction and manual treatment of airway obstruction as needed for comfort.      03/04/2020 2225        Code Status History    Date Active Date Inactive Code Status Order ID Comments User Context   11/29/2019 1309 12/02/2019 2123 DNR [id number]  Lin Landsman, NP Inpatient   11/27/2019 1701 11/29/2019 1309 Full Code [id number]  Darliss Cheney, MD ED   11/19/2019 0830 11/26/2019 1901 Full Code [id number]  Norval Morton, MD ED   09/26/2019 1506 10/07/2019 2156 Full Code [id number]  Mckinley Jewel, MD ED   05/18/2019 1240 05/23/2019 0153 Full Code [id number]  Lequita Halt, MD ED   04/18/2019 0556 04/20/2019 2128 Full Code [id number]  Shela Leff, MD Inpatient   02/09/2019 0436 02/19/2019 2225 Full Code [id number]  Rise Patience, MD ED   01/23/2019 1757 02/08/2019 0133 Full Code [id number]  Georgette Shell, MD ED   11/01/2018 0029 11/05/2018 2014 Full Code [id number]  Sid Falcon, MD ED   09/24/2018 2303 10/02/2018 1600 Full Code [id number]  Elwyn Reach, MD Inpatient   07/20/2018 1655 07/26/2018 1800 Full Code [id number]  Debbe Odea, MD ED   03/20/2018 0402 03/24/2018 1949 Full Code [id number]  Norval Morton, MD ED   11/27/2017 0705 11/30/2017 1625 Full Code [id number]  Radene Gunning, NP ED   10/01/2016 0033 10/01/2016 1946 Full Code [id number]  Maryellen Pile, MD ED   06/17/2016 1720 06/18/2016 2022 Full Code [id number]  Reyne Dumas, MD ED   04/23/2016 0418 04/26/2016 1716 Full Code [id number]  Rise Patience, MD ED   Advance Care Planning Activity        IV Access:    Peripheral  IV   Procedures and diagnostic studies:   DG CHEST PORT 1 VIEW  Result Date: 03/20/2020 CLINICAL DATA:  Acute on chronic respiratory failure with hypoxia. EXAM: PORTABLE CHEST 1 VIEW COMPARISON:  Radiograph 03/18/2020. Most recent CT 01/31/2019 FINDINGS: Improving lung volumes. Persistent but improving pleural effusions. Pulmonary edema appears similar. Cardiomegaly is similar. No new or progressive airspace disease. No pneumothorax. No acute osseous abnormalities are seen. IMPRESSION: 1. Improving lung volumes. Persistent but improving pleural effusions. 2. Similar pulmonary edema and cardiomegaly. Electronically Signed   By: Keith Rake M.D.   On: 03/20/2020 23:00     Medical Consultants:    None.  Anti-Infectives:   None  Subjective:    Juley Giovanetti relates her breathing is unchanged compared to yesterday.  She is not hungry this morning.  Objective:    Vitals:   03/21/20 2100 03/22/20 0000 03/22/20 0400 03/22/20 5003  BP:  (!) 134/100 126/82   Pulse: (!) 108 (!) 124 96   Resp: (!) 25 18 20    Temp:   98.6 F (37 C) 97.8 F (36.6 C)  TempSrc:   Oral   SpO2: 96% (!) 89% 92% 93%  Weight:   69.9 kg   Height:       SpO2: 93 % O2 Flow Rate (L/min): 15 L/min   Intake/Output Summary (Last 24 hours) at 03/22/2020 0922 Last data filed at 03/22/2020 0600 Gross per 24 hour  Intake 460 ml  Output 137 ml  Net 323 ml   Filed Weights   03/20/20 0230 03/21/20 0330 03/22/20 0400  Weight: 68.4 kg 69.2 kg 69.9 kg    Exam: General exam: In no acute distress. Respiratory system: Good air movement and diffuse crackles bilaterally at bases. Cardiovascular system: S1 & S2 heard, RRR.  Positive JVD Gastrointestinal system: Abdomen is nondistended, soft and nontender.  Extremities: 2+ edema Skin: No rashes, lesions or ulcers Psychiatry: She has no insight into medical condition.  Data Reviewed:    Labs: Basic Metabolic Panel: Recent Labs  Lab 03/27/2020 0926  04/03/2020 1458 03/18/20 0311 03/19/20 0236 03/20/20 0254 03/21/20 0334  NA 141  --  139 139 138 138  K 3.2*  --  4.3 4.0 3.9 4.3  CL 100  --  102 102 102 102  CO2 25  --  24 26 25 26   GLUCOSE 103*  --  121* 73 120* 97  BUN 35*  --  38* 37* 38* 39*  CREATININE 1.82*  --  2.06* 2.54* 2.92* 3.33*  CALCIUM 9.3  --  8.9 8.9 8.7* 8.9  MG  --  1.8 2.3 2.1 2.1  --    GFR Estimated Creatinine Clearance: 14.3 mL/min (A) (by C-G formula based on SCr of 3.33 mg/dL (H)). Liver Function Tests: Recent Labs  Lab 03/05/2020 0926  AST 10*  ALT 7  ALKPHOS 106  BILITOT 0.8  PROT 5.7*  ALBUMIN 2.6*   Recent Labs  Lab 03/16/2020 0926  LIPASE 15   No results for input(s): AMMONIA in the last 168 hours. Coagulation profile No results for input(s): INR, PROTIME in the last 168 hours. COVID-19 Labs  No results for input(s): DDIMER, FERRITIN, LDH, CRP in the last 72 hours.  Lab Results  Component Value Date   SARSCOV2NAA NEGATIVE 03/31/2020   [place] NEGATIVE 11/27/2019   [place] NEGATIVE 11/19/2019   [place] NEGATIVE 09/26/2019    CBC: Recent Labs  Lab 04/01/2020 0926 03/18/20 0311 03/19/20 0236 03/20/20 0254 03/21/20 0821  WBC 6.0 6.3 5.1 5.5 5.1  NEUTROABS 4.9  --   --   --  3.7  HGB 11.7* 11.3* 11.3* 11.0* 11.5*  HCT 39.7 37.3 37.5 36.3 38.3  MCV 90.2 88.8 89.3 88.5 89.3  PLT 266 218 228 236 257   Cardiac Enzymes: No results for input(s): CKTOTAL, CKMB, CKMBINDEX, TROPONINI in the last 168 hours. BNP (last 3 results) No results for input(s): PROBNP in the last 8760 hours. CBG: Recent Labs  Lab 04/03/2020 1706  GLUCAP 85   D-Dimer: No results for input(s): DDIMER in the last 72 hours. Hgb A1c: No results for input(s): HGBA1C in the last 72 hours. Lipid Profile: No results for input(s): CHOL, HDL, LDLCALC, TRIG, CHOLHDL, LDLDIRECT in the last 72 hours. Thyroid function studies: No results for input(s): TSH, T4TOTAL, T3FREE, THYROIDAB in the last 72  hours.  Invalid input(s): FREET3 Anemia work up: No results for input(s):  VITAMINB12, FOLATE, FERRITIN, TIBC, IRON, RETICCTPCT in the last 72 hours. Sepsis Labs: Recent Labs  Lab 03/18/20 0311 03/19/20 0236 03/20/20 0254 03/21/20 0821 03/22/20 0244  PROCALCITON <0.10 0.16  --  <0.10 0.13  WBC 6.3 5.1 5.5 5.1  --    Microbiology Recent Results (from the past 240 hour(s))  Urine culture     Status: None   Collection Time: 03/11/2020  9:51 AM   Specimen: Urine, Random  Result Value Ref Range Status   Specimen Description URINE, RANDOM  Final   Special Requests NONE  Final   Culture   Final    NO GROWTH Performed at [facility], [address] [place], [place] [address]    Report Status 03/18/2020 FINAL  Final  Resp Panel by RT-PCR (Flu A&B, Covid) Nasopharyngeal Swab     Status: None   Collection Time: 03/24/2020 10:34 AM   Specimen: Nasopharyngeal Swab; Nasopharyngeal(NP) swabs in vial transport medium  Result Value Ref Range Status   SARS Coronavirus 2 by RT PCR NEGATIVE NEGATIVE Final    Comment: (NOTE) SARS-CoV-2 target nucleic acids are NOT DETECTED.  The SARS-CoV-2 RNA is generally detectable in upper respiratory specimens during the acute phase of infection. The lowest concentration of SARS-CoV-2 viral copies this assay can detect is 138 copies/mL. A negative result does not preclude SARS-Cov-2 infection and should not be used as the sole basis for treatment or other patient management decisions. A negative result may occur with  improper specimen collection/handling, submission of specimen other than nasopharyngeal swab, presence of viral mutation(s) within the areas targeted by this assay, and inadequate number of viral copies(<138 copies/mL). A negative result must be combined with clinical observations, patient history, and epidemiological information. The expected result is Negative.  Fact Sheet for Patients:   EntrepreneurPulse.com.au  Fact Sheet for Healthcare Providers:  IncredibleEmployment.be  This test is no t yet approved or cleared by the [place] FDA and  has been authorized for detection and/or diagnosis of SARS-CoV-2 by FDA under an Emergency Use Authorization (EUA). This EUA will remain  in effect (meaning this test can be used) for the duration of the COVID-19 declaration under Section 564(b)(1) of the Act, 21 U.S.C.section 360bbb-3(b)(1), unless the authorization is terminated  or revoked sooner.       Influenza A by PCR NEGATIVE NEGATIVE Final   Influenza B by PCR NEGATIVE NEGATIVE Final    Comment: (NOTE) The Xpert Xpress SARS-CoV-2/FLU/RSV plus assay is intended as an aid in the diagnosis of influenza from Nasopharyngeal swab specimens and should not be used as a sole basis for treatment. Nasal washings and aspirates are unacceptable for Xpert Xpress SARS-CoV-2/FLU/RSV testing.  Fact Sheet for Patients: EntrepreneurPulse.com.au  Fact Sheet for Healthcare Providers: IncredibleEmployment.be  This test is not yet approved or cleared by the [place] FDA and has been authorized for detection and/or diagnosis of SARS-CoV-2 by FDA under an Emergency Use Authorization (EUA). This EUA will remain in effect (meaning this test can be used) for the duration of the COVID-19 declaration under Section 564(b)(1) of the Act, 21 U.S.C. section 360bbb-3(b)(1), unless the authorization is terminated or revoked.  Performed at [facility], [place] [address] [place], [place] [address]   Culture, blood (routine x 2) Call MD if unable to obtain prior to antibiotics being given     Status: None (Preliminary result)   Collection Time: 03/21/20  8:45 AM   Specimen: BLOOD  Result Value Ref Range Status  Specimen Description BLOOD LEFT ANTECUBITAL  Final   Special Requests   Final    BOTTLES DRAWN  AEROBIC ONLY Blood Culture adequate volume   Culture   Final    NO GROWTH < 12 HOURS Performed at [facility], [address], [place], [place] [address]    Report Status PENDING  Incomplete  Culture, blood (Routine X 2) w Reflex to ID Panel     Status: None (Preliminary result)   Collection Time: 03/21/20  8:55 AM   Specimen: BLOOD  Result Value Ref Range Status   Specimen Description BLOOD RIGHT ANTECUBITAL  Final   Special Requests   Final    BOTTLES DRAWN AEROBIC ONLY Blood Culture adequate volume   Culture   Final    NO GROWTH < 12 HOURS Performed at [facility], [place] [address] [place], [place] [address]    Report Status PENDING  Incomplete     Medications:   .  stroke: mapping our early stages of recovery book   Does not apply Once  . allopurinol  200 mg Oral QHS  . apixaban  2.5 mg Oral BID  . aspirin EC  81 mg Oral Daily  . atorvastatin  40 mg Oral QHS  . DULoxetine  30 mg Oral Daily  . gabapentin  100 mg Oral BID  . primidone  50 mg Oral QHS  . torsemide  40 mg Oral BID   Continuous Infusions:     LOS: 5 days   Charlynne Cousins  [facility]  03/22/2020, 9:22 AM

## 2020-03-22 NOTE — Progress Notes (Signed)
Patient resting comfortable no shortness of breath, will continue to monitor.

## 2020-03-22 NOTE — Progress Notes (Signed)
Palliative Medicine Inpatient Follow Up Note   Reason for Consultation: Establishing goals of care  HPI/Patient Profile: 64 y.o. female  with past medical history of combined systolic and diastolic CHF, chronic 4L oixygen, CKD stage 4, nonadherence with her medications, anxiety admitted on 03/30/2020 with headache, falls, increased shortness shortness of breath. Found to have left MCA stroke. Appeared to be returning to baseline.   Palliative care was asked to get involved in the setting of acute respiratory failure due to pulmonary edema.  Today's Discussion (03/22/2920 ): Chart reviewed.  Patient assessed at bedside noted to be on 15 L high flow nasal cannula does not appear to be in active respiratory distress.  She was finishing up eggs and bacon for breakfast.  She endorses mild shortness of breath.  I called patient's daughter Mardene Lessig.  She had spoken with Dr. Aileen Fass who, given all of Connie's medical conditions, strongly recommended consideration of comfort care.We talked about transition to comfort measures in house and what that would entail inclusive of medications to control pain, dyspnea, agitation, nausea, itching, and hiccups.  We discussed stopping all uneccessary measures such as blood draws, needle sticks, and frequent vital signs.  Discussed that the visitation policy at this time would be liberalized to enable more family members to visit and spend time with [name].   [name] is in agreement with this plan.  [name] states that her goal for her mother would be to get home on hospice.  She was prior enrolled in [facility].  I was able to reach out to the representative [employer] Carlota Raspberry this morning to see if he could accommodate getting Shiloh home safely. In terms of her oxygen needs, they would be able to.  In question is whether or not [facility] could coordinate for a CADD pump (morphine infusion).  This is in anticipation of symptom management for the severe  dyspnea Yasaman will likely enter as she progresses.  In communication with patient's primary provider, nurse, and case manager to establish the next steps regarding Merrilyn's care.  Questions and concerns addressed   Objective Assessment: Vital Signs Vitals:   03/22/20 0400 03/22/20 0853  BP: 126/82   Pulse: 96   Resp: 20   Temp: 98.6 F (37 C) 97.8 F (36.6 C)  SpO2: 92% 93%    Intake/Output Summary (Last 24 hours) at 03/22/2020 1019 Last data filed at 03/22/2020 0600 Gross per 24 hour  Intake 460 ml  Output 137 ml  Net 323 ml   Last Weight  Most recent update: 03/22/2020  4:27 AM   Weight  69.9 kg (154 lb 1.6 oz)           Gen:  Older AA F appears younger than stated age 29: moist mucous membranes CV: Regular rate and rhythm  PULM: 15LPM HFNC, diffuse crackles ABD: soft/nontender  EXT: (+) 3 pitting edema Neuro: Alert and oriented x1-1  SUMMARY OF RECOMMENDATIONS   DNAR/DNI  Transition focus to comfort  Liberalize visitation policy  Encouraged patients pastor to come in  Appreciate transition of care support trying to see what will be possible with [facility] hospice  Time Spent: 35 Greater than 50% of the time was spent in counseling and coordination of care ______________________________________________________________________________________ [place] Team Team Cell Phone: [phone] Please utilize secure chat with additional questions, if there is no response within 30 minutes please call the above phone number  Palliative Medicine Team providers are available by phone from 7am  to 7pm daily and can be reached through the team cell phone.  Should this patient require assistance outside of these hours, please call the patient's attending physician.

## 2020-03-22 NOTE — Progress Notes (Signed)
   03/22/20 0000  Assess: MEWS Score  BP (!) 134/100  Pulse Rate (!) 124  ECG Heart Rate (!) 125  Resp 18  SpO2 (!) 89 %  Assess: MEWS Score  MEWS Temp 0  MEWS Systolic 0  MEWS Pulse 2  MEWS RR 0  MEWS LOC 0  MEWS Score 2  MEWS Score Color Yellow  Assess: if the MEWS score is Yellow or Red  Were vital signs taken at a resting state? Yes  Focused Assessment No change from prior assessment  Early Detection of Sepsis Score *See Row Information* Low  MEWS guidelines implemented *See Row Information* No, previously yellow, continue vital signs every 4 hours  Document  Patient Outcome Stabilized after interventions  Progress note created (see row info) Yes

## 2020-03-22 NOTE — Plan of Care (Signed)
  Problem: Elimination: Goal: Will not experience complications related to bowel motility Outcome: Completed/Met Goal: Will not experience complications related to urinary retention Outcome: Completed/Met   Problem: Safety: Goal: Ability to remain free from injury will improve Outcome: Completed/Met   Problem: Skin Integrity: Goal: Risk for impaired skin integrity will decrease Outcome: Completed/Met

## 2020-03-22 NOTE — TOC Progression Note (Addendum)
Transition of Care [facility]) - Progression Note    Patient Details  Name: Kara Hanson MRN: [id number] Date of Birth: 01/29/1956  Transition of Care [facility]) CM/SW Contact  Graves-Bigelow, Ocie Cornfield, RN Phone Number: 03/22/2020, 2:08 PM  Clinical Narrative:   Case Manager received a message from the [place] NP regarding plan of care. Patient is active with [facility]- plan for comfort care per NP. Sharyn Lull NP reached out to [employer] to see if the agency can support 15 liters oxygen and the morphine pump. Octavia Bruckner is awaiting to hear back from the agency regarding oxygen delivery. Case Manager will continue to follow.    63 [facility] has approved the liter flow and DME will be delivered to the home today. Please call Daughter [name] to make sure 02 is in the home prior to d/c. Patient has hospital bed/overbed table and bedside commode. [facility] is supplying the oxygen and morphine pca pump. Patient will need ambulance transport home. Daughter is aware plan is for Monday.Case Manager called Triage Team with orders. Transition of care team to follow.    Expected Discharge Plan: Home w Hospice Care Barriers to Discharge: Continued Medical Work up  Expected Discharge Plan and Services Expected Discharge Plan: [place] Acute Care Choice: Hospice  Readmission Risk Interventions Readmission Risk Prevention Plan 12/02/2019 10/03/2019 05/22/2019  Transportation Screening Complete Complete Complete  PCP or Specialist Appt within 3-5 Days - - -  Not Complete comments - - -  HRI or [place] Work Consult for [place] - - -  Medication Review Press photographer) Complete Complete Complete  PCP or Specialist appointment within 3-5 days of discharge Complete - Complete  HRI or [place] Complete Complete Complete  SW Recovery Care/Counseling Consult Complete Complete  Complete  Palliative Care Screening Complete Not Applicable Not [place] Not Applicable Not Applicable Not Applicable  Some recent data might be hidden

## 2020-03-23 ENCOUNTER — Other Ambulatory Visit: Payer: Self-pay

## 2020-03-23 ENCOUNTER — Encounter (HOSPITAL_COMMUNITY): Payer: Self-pay | Admitting: Family Medicine

## 2020-03-23 LAB — BASIC METABOLIC PANEL
Anion gap: 12 (ref 5–15)
BUN: 47 mg/dL — ABNORMAL HIGH (ref 8–23)
CO2: 25 mmol/L (ref 22–32)
Calcium: 8.8 mg/dL — ABNORMAL LOW (ref 8.9–10.3)
Chloride: 99 mmol/L (ref 98–111)
Creatinine, Ser: 3.59 mg/dL — ABNORMAL HIGH (ref 0.44–1.00)
GFR, Estimated: 13 mL/min — ABNORMAL LOW (ref >60)
Glucose, Bld: 153 mg/dL — ABNORMAL HIGH (ref 70–99)
Potassium: 5.2 mmol/L — ABNORMAL HIGH (ref 3.5–5.1)
Sodium: 136 mmol/L (ref 135–145)

## 2020-03-23 MED ORDER — SODIUM POLYSTYRENE SULFONATE 15 GM/60ML PO SUSP
15.0000 g | Freq: Two times a day (BID) | ORAL | Status: DC
  Administered 2020-03-23: 11:00:00 15 g via ORAL
  Filled 2020-03-23: qty 60

## 2020-03-23 MED ORDER — SODIUM ZIRCONIUM CYCLOSILICATE 10 G PO PACK
10.0000 g | PACK | Freq: Once | ORAL | Status: AC
  Administered 2020-03-23: 22:00:00 10 g via ORAL
  Filled 2020-03-23: qty 1

## 2020-03-23 NOTE — Progress Notes (Addendum)
Manufacturing engineer [facility]) Hospital Liaison note.     Received request from Larina Bras, RN for family interest in [facility]. [place] is unable to offer a room today.Eligibility is confirmed. Hospital Liaison will follow up tomorrow or sooner if a room becomes available.    A Please do not hesitate to call with questions.     Thank you,    Farrel Gordon, RN, [place] (listed on [facility] under [place])     [phone]

## 2020-03-23 NOTE — Progress Notes (Signed)
[facility] PROGRESS NOTE    Progress Note  Kara Hanson  XHB:[id number] DOB: 1956/03/30 DOA: 03/16/2020 PCP: [name], MD     Brief Narrative:   Kara Hanson is an 64 y.o. female past medical history of chronic combined systolic and diastolic heart failure on 4 L supplemental oxygen for COPD, chronic kidney disease stage IV not adherence to her medication, mostly bedbound with severe deconditioning per family's report who has suffered 2 falls over the last 24 hours, comes to the ED for shortness of breath headaches and fall.  She was found afebrile saturating in the 60s chest x-ray was notable for left based air opacity and a small bilateral pleural effusion imaging showed no acute findings except except for CTA neck that showed 2 short segments of occlusion within the left MCA, BNP of 4000, troponins slightly elevated but flat neurology was consulted by the emergency room physician.  She was started on IV Lasix Benadryl Toradol fentanyl Zofran and potassium supplements.  Assessment/Plan:   Acute CVA: MRI was positive for CVA to the left MCA with chronic changes. After having a long discussion with discussion with the family about her poor prognosis due to pump failure and the fact that the patient is developing acute kidney injury despite diuresing and being fluid overloaded.  They relate that they wanted to take the patient home with comfort measures and hospice. We will reconsult palliative care to help arrange transitioning to home.  Acute respiratory failure with hypoxia acute combined systolic and diastolic heart failure:: Chest x-ray shows bilateral pulmonary infiltrates her creatinine is trending up question cardiorenal syndrome she was restarted on her Lasix I discussed this with the daughter that this may continue to affect her renal function. Procalcitonin is low, she has remained afebrile. I did speak to the advanced heart failure team if they had seen her  recently about 3 months ago and they related that she is not a candidate for advanced therapy due to her renal failure and multiple comorbidities and unfortunate there is little left to offer for meaningful recovery. After explaining to the daughter her renal function was deteriorating, she was becoming fluid overloaded despite diuretic sand  fact that she is becoming hypotensive and tachycardic and she is not responding to treatment makes her outcome extremely poor. The daughter understands and she relates she would want her home for Christmas, will reinvolve palliative care to see how we could transition her home with comfort measures in the setting of cardiorenal syndrome and pulmonary edema. With worsening renal function and worsening potassium, will hold on Demadex she appears to be significant fluid overloaded on physical exam. We will continue morphine sublingual and IV.  Acute Kidney injury on chronic kidney disease stage IV/cardiorenal syndrome: Her creatinine is worsening likely due to cardiorenal syndrome. She is not a candidate for advanced therapy. This was explained to family see above for further details.  Hyperkalemia Due to poor cardiac output will give her Kayexalate, I discussed with the daughter we will not check potassium again.   Type 2 diabetes mellitus with diabetic neuropathy, unspecified (HCC) Blood glucose fairly controlled hemoglobin A1c 6.8 continue current regimen.   DVT prophylaxis: lovenox Family Communication:none Status is: Inpatient  Remains inpatient appropriate because:Hemodynamically unstable   Dispo: The patient is from: Home              Anticipated d/c is to: SNF              Anticipated d/c date is: >3  days              Patient currently is not medically stable to d/c.   Code Status:     Code Status Orders  (From admission, onward)         Start     Ordered   03/23/2020 2223  Do not attempt resuscitation (DNR)  Continuous       Question  Answer Comment  In the event of cardiac or respiratory ARREST Do not call a "code blue"   In the event of cardiac or respiratory ARREST Do not perform Intubation, CPR, defibrillation or ACLS   In the event of cardiac or respiratory ARREST Use medication by any route, position, wound care, and other measures to relive pain and suffering. May use oxygen, suction and manual treatment of airway obstruction as needed for comfort.      03/06/2020 2225        Code Status History    Date Active Date Inactive Code Status Order ID Comments User Context   11/29/2019 1309 12/02/2019 2123 DNR [id number]  Lin Landsman, NP Inpatient   11/27/2019 1701 11/29/2019 1309 Full Code [id number]  Darliss Cheney, MD ED   11/19/2019 0830 11/26/2019 1901 Full Code [id number]  Norval Morton, MD ED   09/26/2019 1506 10/07/2019 2156 Full Code [id number]  Mckinley Jewel, MD ED   05/18/2019 1240 05/23/2019 0153 Full Code [id number]  Lequita Halt, MD ED   04/18/2019 0556 04/20/2019 2128 Full Code [id number]  Shela Leff, MD Inpatient   02/09/2019 0436 02/19/2019 2225 Full Code [id number]  Rise Patience, MD ED   01/23/2019 1757 02/08/2019 0133 Full Code [id number]  Georgette Shell, MD ED   11/01/2018 0029 11/05/2018 2014 Full Code [id number]  Sid Falcon, MD ED   09/24/2018 2303 10/02/2018 1600 Full Code [id number]  Elwyn Reach, MD Inpatient   07/20/2018 1655 07/26/2018 1800 Full Code [id number]  Debbe Odea, MD ED   03/20/2018 0402 03/24/2018 1949 Full Code [id number]  Norval Morton, MD ED   11/27/2017 0705 11/30/2017 1625 Full Code [id number]  Radene Gunning, NP ED   10/01/2016 0033 10/01/2016 1946 Full Code [id number]  Maryellen Pile, MD ED   06/17/2016 1720 06/18/2016 2022 Full Code [id number]  Reyne Dumas, MD ED   04/23/2016 0418 04/26/2016 1716 Full Code [id number]  Rise Patience, MD ED   Advance Care Planning Activity        IV Access:    Peripheral IV   Procedures and diagnostic studies:   No  results found.   Medical Consultants:    None.  Anti-Infectives:   None  Subjective:    Kara Hanson relates her breathing is worse than yesterday.  Objective:    Vitals:   03/22/20 0000 03/22/20 0400 03/22/20 0853 03/22/20 1948  BP: (!) 134/100 126/82  119/79  Pulse: (!) 124 96  96  Resp: 18 20  20   Temp:  98.6 F (37 C) 97.8 F (36.6 C) 97.9 F (36.6 C)  TempSrc:  Oral  Oral  SpO2: (!) 89% 92% 93% 94%  Weight:  69.9 kg    Height:       SpO2: 94 % O2 Flow Rate (L/min): 14 L/min   Intake/Output Summary (Last 24 hours) at 03/23/2020 0935 Last data filed at 03/23/2020 0836 Gross per 24 hour  Intake 240 ml  Output 0 ml  Net 240 ml   Filed Weights   03/20/20 0230 03/21/20 0330  03/22/20 0400  Weight: 68.4 kg 69.2 kg 69.9 kg    Exam: General exam: In no acute distress. Respiratory system: Good air movement and diffuse crackles bilaterally Cardiovascular system: S1 & S2 heard, RRR.  Positive JVD Gastrointestinal system: Abdomen is nondistended, soft and nontender.  Extremities: No pedal edema. Skin: No rashes, lesions or ulcers Psychiatry: Judgement and insight appear normal. Mood & affect appropriate.  Data Reviewed:    Labs: Basic Metabolic Panel: Recent Labs  Lab 03/09/2020 1458 03/18/20 0311 03/19/20 0236 03/20/20 0254 03/21/20 0334 03/22/20 0942  NA  --  139 139 138 138 134*  K  --  4.3 4.0 3.9 4.3 5.4*  CL  --  102 102 102 102 100  CO2  --  24 26 25 26  19*  GLUCOSE  --  121* 73 120* 97 132*  BUN  --  38* 37* 38* 39* 46*  CREATININE  --  2.06* 2.54* 2.92* 3.33* 3.63*  CALCIUM  --  8.9 8.9 8.7* 8.9 8.9  MG 1.8 2.3 2.1 2.1  --   --    GFR Estimated Creatinine Clearance: 13.1 mL/min (A) (by C-G formula based on SCr of 3.63 mg/dL (H)). Liver Function Tests: Recent Labs  Lab 03/30/2020 0926  AST 10*  ALT 7  ALKPHOS 106  BILITOT 0.8  PROT 5.7*  ALBUMIN 2.6*   Recent Labs  Lab 03/12/2020 0926  LIPASE 15   No results for input(s):  AMMONIA in the last 168 hours. Coagulation profile No results for input(s): INR, PROTIME in the last 168 hours. COVID-19 Labs  No results for input(s): DDIMER, FERRITIN, LDH, CRP in the last 72 hours.  Lab Results  Component Value Date   SARSCOV2NAA NEGATIVE 03/04/2020   [place] NEGATIVE 11/27/2019   [place] NEGATIVE 11/19/2019   [place] NEGATIVE 09/26/2019    CBC: Recent Labs  Lab 03/16/2020 0926 03/18/20 0311 03/19/20 0236 03/20/20 0254 03/21/20 0821  WBC 6.0 6.3 5.1 5.5 5.1  NEUTROABS 4.9  --   --   --  3.7  HGB 11.7* 11.3* 11.3* 11.0* 11.5*  HCT 39.7 37.3 37.5 36.3 38.3  MCV 90.2 88.8 89.3 88.5 89.3  PLT 266 218 228 236 257   Cardiac Enzymes: No results for input(s): CKTOTAL, CKMB, CKMBINDEX, TROPONINI in the last 168 hours. BNP (last 3 results) No results for input(s): PROBNP in the last 8760 hours. CBG: Recent Labs  Lab 03/11/2020 1706  GLUCAP 85   D-Dimer: No results for input(s): DDIMER in the last 72 hours. Hgb A1c: No results for input(s): HGBA1C in the last 72 hours. Lipid Profile: No results for input(s): CHOL, HDL, LDLCALC, TRIG, CHOLHDL, LDLDIRECT in the last 72 hours. Thyroid function studies: No results for input(s): TSH, T4TOTAL, T3FREE, THYROIDAB in the last 72 hours.  Invalid input(s): FREET3 Anemia work up: No results for input(s): VITAMINB12, FOLATE, FERRITIN, TIBC, IRON, RETICCTPCT in the last 72 hours. Sepsis Labs: Recent Labs  Lab 03/18/20 0311 03/19/20 0236 03/20/20 0254 03/21/20 0821 03/22/20 0244  PROCALCITON <0.10 0.16  --  <0.10 0.13  WBC 6.3 5.1 5.5 5.1  --    Microbiology Recent Results (from the past 240 hour(s))  Urine culture     Status: None   Collection Time: 03/27/2020  9:51 AM   Specimen: Urine, Random  Result Value Ref Range Status   Specimen Description URINE, RANDOM  Final   Special Requests NONE  Final   Culture   Final    NO GROWTH Performed at [facility], [place] [address],  [place], [place] [address]    Report Status 03/18/2020 FINAL  Final  Resp Panel by RT-PCR (Flu A&B, Covid) Nasopharyngeal Swab     Status: None   Collection Time: 03/25/2020 10:34 AM   Specimen: Nasopharyngeal Swab; Nasopharyngeal(NP) swabs in vial transport medium  Result Value Ref Range Status   SARS Coronavirus 2 by RT PCR NEGATIVE NEGATIVE Final    Comment: (NOTE) SARS-CoV-2 target nucleic acids are NOT DETECTED.  The SARS-CoV-2 RNA is generally detectable in upper respiratory specimens during the acute phase of infection. The lowest concentration of SARS-CoV-2 viral copies this assay can detect is 138 copies/mL. A negative result does not preclude SARS-Cov-2 infection and should not be used as the sole basis for treatment or other patient management decisions. A negative result may occur with  improper specimen collection/handling, submission of specimen other than nasopharyngeal swab, presence of viral mutation(s) within the areas targeted by this assay, and inadequate number of viral copies(<138 copies/mL). A negative result must be combined with clinical observations, patient history, and epidemiological information. The expected result is Negative.  Fact Sheet for Patients:  EntrepreneurPulse.com.au  Fact Sheet for Healthcare Providers:  IncredibleEmployment.be  This test is no t yet approved or cleared by the [place] FDA and  has been authorized for detection and/or diagnosis of SARS-CoV-2 by FDA under an Emergency Use Authorization (EUA). This EUA will remain  in effect (meaning this test can be used) for the duration of the COVID-19 declaration under Section 564(b)(1) of the Act, 21 U.S.C.section 360bbb-3(b)(1), unless the authorization is terminated  or revoked sooner.       Influenza A by PCR NEGATIVE NEGATIVE Final   Influenza B by PCR NEGATIVE NEGATIVE Final    Comment: (NOTE) The Xpert Xpress SARS-CoV-2/FLU/RSV plus assay is  intended as an aid in the diagnosis of influenza from Nasopharyngeal swab specimens and should not be used as a sole basis for treatment. Nasal washings and aspirates are unacceptable for Xpert Xpress SARS-CoV-2/FLU/RSV testing.  Fact Sheet for Patients: EntrepreneurPulse.com.au  Fact Sheet for Healthcare Providers: IncredibleEmployment.be  This test is not yet approved or cleared by the [place] FDA and has been authorized for detection and/or diagnosis of SARS-CoV-2 by FDA under an Emergency Use Authorization (EUA). This EUA will remain in effect (meaning this test can be used) for the duration of the COVID-19 declaration under Section 564(b)(1) of the Act, 21 U.S.C. section 360bbb-3(b)(1), unless the authorization is terminated or revoked.  Performed at [facility], [place] [address] [place], [place] [address]   Culture, blood (routine x 2) Call MD if unable to obtain prior to antibiotics being given     Status: None (Preliminary result)   Collection Time: 03/21/20  8:45 AM   Specimen: BLOOD  Result Value Ref Range Status   Specimen Description BLOOD LEFT ANTECUBITAL  Final   Special Requests   Final    BOTTLES DRAWN AEROBIC ONLY Blood Culture adequate volume   Culture   Final    NO GROWTH 2 DAYS Performed at [facility], [place] [address], [place], [place] [address]    Report Status PENDING  Incomplete  Culture, blood (Routine X 2) w Reflex to ID Panel     Status: None (Preliminary result)   Collection Time: 03/21/20  8:55 AM   Specimen: BLOOD  Result Value Ref Range Status   Specimen Description BLOOD RIGHT ANTECUBITAL  Final   Special Requests  Final    BOTTLES DRAWN AEROBIC ONLY Blood Culture adequate volume   Culture   Final    NO GROWTH 2 DAYS Performed at [facility], [place] [address] [place], [place] [address]    Report Status PENDING  Incomplete     Medications:   . sodium polystyrene  15 g Oral  BID   Continuous Infusions:     LOS: 6 days   Charlynne Cousins  [facility]  03/23/2020, 9:35 AM

## 2020-03-23 NOTE — Progress Notes (Addendum)
Palliative Medicine RN Note: Our team rec'd a call from pt's daughter [name] [phone]). They no longer feel they can take the pt home, and the aides will not be coming enough for them to safely care for her at home. They are requesting residential hospice facility. When I asked if they have a location preference, [name] states "When we talked to the doctor, he mentioned one by our house." With further questioning, that is [facility]. I let her know that there is a wait list but that I will be happy to make the referral, and I notified her care team.  Marjie Skiff. Cherylann Ratel, RN, BSN, [facility] Palliative Medicine Team 03/23/2020 9:21 AM Office [phone]   Esther Hardy, [facility] liaison, came by our office this am, and I let him know that the pt's discharge plan has changed.  Marjie Skiff Kaushik Maul, RN, BSN, [facility] Palliative Medicine Team 03/23/2020 2:46 PM Office [phone]

## 2020-03-23 NOTE — Care Management Important Message (Signed)
Important Message  Patient Details  Name: Kara Hanson MRN: [id number] Date of Birth: Nov 02, 1955   Medicare Important Message Given:  Yes     [name] 03/23/2020, 11:08 AM

## 2020-03-23 NOTE — Progress Notes (Signed)
Comfort cart delivered to pt's room.

## 2020-03-23 NOTE — TOC Progression Note (Addendum)
Transition of Care [facility]) - Progression Note    Patient Details  Name: Kara Hanson MRN: [id number] Date of Birth: 02/15/1956  Transition of Care [facility]) CM/SW Contact  Zenon Mayo, RN Phone Number: 03/23/2020, 9:00 AM  Clinical Narrative:    NCM spoke with Octavia Bruckner with [facility], he will notify this NCM when the DME has been delivered to patient's home, then this NCM will set up PTAR for transport.  12/120- 9:45 patient family now is requesting [facility], CSW informed.   Expected Discharge Plan: Home w Hospice Care Barriers to Discharge: Continued Medical Work up  Expected Discharge Plan and Services Expected Discharge Plan: [place] Acute Care Choice: Hospice                                         Social Determinants of Health (SDOH) Interventions    Readmission Risk Interventions Readmission Risk Prevention Plan 12/02/2019 10/03/2019 05/22/2019  Transportation Screening Complete Complete Complete  PCP or Specialist Appt within 3-5 Days - - -  Not Complete comments - - -  HRI or [place] Work Consult for [place] - - -  Medication Review Press photographer) Complete Complete Complete  PCP or Specialist appointment within 3-5 days of discharge Complete - Complete  HRI or [place] Complete Complete Complete  SW Recovery Care/Counseling Consult Complete Complete Complete  Palliative Care Screening Complete Not Applicable Not [place] Not Applicable Not Applicable Not Applicable  Some recent data might be hidden

## 2020-03-23 NOTE — TOC Progression Note (Signed)
Transition of Care [facility]) - Progression Note    Patient Details  Name: Kara Hanson MRN: [id number] Date of Birth: 1955-12-13  Transition of Care [facility]) CM/SW [place], [place] Phone Number: 03/23/2020, 12:01 PM  Clinical Narrative:     CSW called pt daughter to discuss hospice facility. CSW explained that [facility] may not have availability for multiple days. CSW offered options in [place], [place], [place], [place]. Daughter explained that other locations would not be able to work and that she wants to stick with [facility].   Expected Discharge Plan: [place] Barriers to Discharge: Continued Medical Work up  Expected Discharge Plan and Services Expected Discharge Plan: [place]     Post Acute Care Choice: Hospice                                         Social Determinants of Health (SDOH) Interventions    Readmission Risk Interventions Readmission Risk Prevention Plan 12/02/2019 10/03/2019 05/22/2019  Transportation Screening Complete Complete Complete  PCP or Specialist Appt within 3-5 Days - - -  Not Complete comments - - -  HRI or [place] Work Consult for [place] - - -  Medication Review Press photographer) Complete Complete Complete  PCP or Specialist appointment within 3-5 days of discharge Complete - Complete  HRI or [place] Complete Complete Complete  SW Recovery Care/Counseling Consult Complete Complete Complete  Palliative Care Screening Complete Not Applicable Not [place] Not Applicable Not Applicable Not Applicable  Some recent data might be hidden

## 2020-03-23 NOTE — Consult Note (Signed)
   [facility] Inpatient Consult   03/23/2020  Kara Hanson 1955-07-29 [id number]  [employer] [[facility]] Patient: Medicare ACO   Chart reviewed for extreme high risk score for unplanned readmission risk and with 4 hospitalizations in the past 6 months. Chart review reveals the patient is currently transitioning to Hospice/Palliative Care.  Review is patient's current plan is or residential hospice.  Given this choice patient will have full case management services through Hospice and needs will be met at the hospice level of care.  Plan:  Will sign off at transition.  Natividad Brood, RN BSN [facility] Liaison  [phone] business mobile phone Toll free office [phone]  Fax number: [phone] [name]

## 2020-03-24 DIAGNOSIS — E1140 Type 2 diabetes mellitus with diabetic neuropathy, unspecified: Secondary | ICD-10-CM | POA: Diagnosis present

## 2020-03-24 DIAGNOSIS — I63412 Cerebral infarction due to embolism of left middle cerebral artery: Secondary | ICD-10-CM | POA: Diagnosis present

## 2020-03-24 DIAGNOSIS — I6602 Occlusion and stenosis of left middle cerebral artery: Secondary | ICD-10-CM | POA: Diagnosis not present

## 2020-03-24 DIAGNOSIS — Z8616 Personal history of COVID-19: Secondary | ICD-10-CM | POA: Diagnosis not present

## 2020-03-24 DIAGNOSIS — I5043 Acute on chronic combined systolic (congestive) and diastolic (congestive) heart failure: Secondary | ICD-10-CM | POA: Diagnosis present

## 2020-03-24 DIAGNOSIS — I130 Hypertensive heart and chronic kidney disease with heart failure and stage 1 through stage 4 chronic kidney disease, or unspecified chronic kidney disease: Secondary | ICD-10-CM | POA: Diagnosis present

## 2020-03-24 DIAGNOSIS — R519 Headache, unspecified: Secondary | ICD-10-CM | POA: Diagnosis present

## 2020-03-24 DIAGNOSIS — Z7401 Bed confinement status: Secondary | ICD-10-CM | POA: Diagnosis not present

## 2020-03-24 DIAGNOSIS — E785 Hyperlipidemia, unspecified: Secondary | ICD-10-CM | POA: Diagnosis present

## 2020-03-24 DIAGNOSIS — E559 Vitamin D deficiency, unspecified: Secondary | ICD-10-CM | POA: Diagnosis present

## 2020-03-24 DIAGNOSIS — N184 Chronic kidney disease, stage 4 (severe): Secondary | ICD-10-CM | POA: Diagnosis present

## 2020-03-24 DIAGNOSIS — F419 Anxiety disorder, unspecified: Secondary | ICD-10-CM | POA: Diagnosis present

## 2020-03-24 DIAGNOSIS — E1122 Type 2 diabetes mellitus with diabetic chronic kidney disease: Secondary | ICD-10-CM | POA: Diagnosis present

## 2020-03-24 DIAGNOSIS — Z9049 Acquired absence of other specified parts of digestive tract: Secondary | ICD-10-CM | POA: Diagnosis not present

## 2020-03-24 DIAGNOSIS — Z6831 Body mass index (BMI) 31.0-31.9, adult: Secondary | ICD-10-CM | POA: Diagnosis not present

## 2020-03-24 DIAGNOSIS — Z20822 Contact with and (suspected) exposure to covid-19: Secondary | ICD-10-CM | POA: Diagnosis present

## 2020-03-24 DIAGNOSIS — Z7189 Other specified counseling: Secondary | ICD-10-CM | POA: Diagnosis not present

## 2020-03-24 DIAGNOSIS — Z515 Encounter for palliative care: Secondary | ICD-10-CM | POA: Diagnosis not present

## 2020-03-24 DIAGNOSIS — Z66 Do not resuscitate: Secondary | ICD-10-CM | POA: Diagnosis present

## 2020-03-24 DIAGNOSIS — N179 Acute kidney failure, unspecified: Secondary | ICD-10-CM | POA: Diagnosis not present

## 2020-03-24 DIAGNOSIS — E7800 Pure hypercholesterolemia, unspecified: Secondary | ICD-10-CM | POA: Diagnosis present

## 2020-03-24 DIAGNOSIS — I429 Cardiomyopathy, unspecified: Secondary | ICD-10-CM | POA: Diagnosis present

## 2020-03-24 DIAGNOSIS — W19XXXA Unspecified fall, initial encounter: Secondary | ICD-10-CM | POA: Diagnosis present

## 2020-03-24 DIAGNOSIS — I252 Old myocardial infarction: Secondary | ICD-10-CM | POA: Diagnosis not present

## 2020-03-24 DIAGNOSIS — R4701 Aphasia: Secondary | ICD-10-CM | POA: Diagnosis present

## 2020-03-24 DIAGNOSIS — G936 Cerebral edema: Secondary | ICD-10-CM | POA: Diagnosis present

## 2020-03-24 DIAGNOSIS — Z91040 Latex allergy status: Secondary | ICD-10-CM | POA: Diagnosis not present

## 2020-03-24 DIAGNOSIS — J9621 Acute and chronic respiratory failure with hypoxia: Secondary | ICD-10-CM | POA: Diagnosis present

## 2020-03-24 MED ORDER — LORAZEPAM 2 MG/ML IJ SOLN: 0.5000 mg | INTRAMUSCULAR | 0 refills | Status: AC | PRN

## 2020-03-24 MED ORDER — MORPHINE SULFATE 10 MG/5ML PO SOLN: 2.5000 mg | ORAL | 0 refills | Status: AC | PRN

## 2020-03-24 MED ORDER — MORPHINE SULFATE (PF) 2 MG/ML IV SOLN: 2.0000 mg | INTRAVENOUS | 0 refills | Status: AC | PRN

## 2020-03-24 NOTE — Progress Notes (Signed)
Palliative Medicine RN Note: Confirmed with [facility] liaison that pt does not have a bed. Patient remains admitted to [facility] at this time.  Marjie Skiff Logun Colavito, RN, BSN, [facility] Palliative Medicine Team 03/24/2020 9:45 AM Office [phone]

## 2020-03-24 NOTE — Discharge Summary (Signed)
Physician Discharge Summary  Kara Hanson RDE:[id number] DOB: 15-Jun-1955 DOA: 03/27/2020  PCP: [name], MD  Admit date: 03/22/2020 Discharge date: 03/24/2020  Admitted From: Home Disposition:  Residential Hospice facility  Recommendations for Outpatient Follow-up:  1. She will go to residential hospice facility with comfort measures.  Home Health:no Equipment/Devices:None  Discharge Condition:Hospice CODE STATUS:DNR Diet recommendation: Heart Healthy   Brief/Interim Summary: 64 y.o. female past medical history of chronic combined systolic and diastolic heart failure on 4 L supplemental oxygen for COPD, chronic kidney disease stage IV not adherence to her medication, mostly bedbound with severe deconditioning per family's report who has suffered 2 falls over the last 24 hours, comes to the ED for shortness of breath headaches and fall.  She was found afebrile saturating in the 60s chest x-ray was notable for left based air opacity and a small bilateral pleural effusion imaging showed no acute findings except except for CTA neck that showed 2 short segments of occlusion within the left MCA, BNP of 4000, troponins slightly elevated but flat neurology was consulted by the emergency room physician.  She was started on IV Lasix Benadryl Toradol fentanyl Zofran and potassium supplements.  Discharge Diagnoses:  Principal Problem:   Acute on chronic combined systolic and diastolic CHF (congestive heart failure) (HCC) Active Problems:   Hypertension   Type 2 diabetes mellitus with diabetic neuropathy, unspecified (HCC)   Acute on chronic respiratory failure with hypoxia (HCC)   Hypokalemia   CKD (chronic kidney disease), stage IV (HCC)   Headache   Middle cerebral artery stenosis, left Acute CVA of the middle cerebral artery on the left: MRI was positive for stroke. Neurology was consulted who recommended anticoagulation. After having a long discussion with the family about her  poor prognosis due to pump failure and development of acute kidney injury and multiple comorbidities and the fact that she was not responding to treatment. The family met with palliative care and decided to transition to hospice care which they decided towards residential hospice.  Acute respiratory failure with hypoxia secondarily to acute combined systolic and diastolic heart failure: Chest x-ray on bilateral infiltrates. Question due to cardiorenal syndrome she was started on IV Lasix with poor diuresis. The advanced heart failure team met with the family about 3 to 4 months ago and they related she was not a candidate for advanced therapy due to her renal failure and multiple comorbidities and unfortunately there is little left to offer her for meaningful recovery. After explained this to the daughter that her renal function has progressed and she was becoming fluid overloaded developing pulmonary edema requiring 15 L of high flow nasal cannula to keep her saturations stable daughter decided to move towards comfort care. She wish all her labs were stopped and medications not used for comfort be stopped. Pain in respiration has remained stable. All her medications were stopped except for morphine and Ativan which are used for comfort and shortness of breath.`  Acute kidney injury on chronic kidney disease stage IV/cardiorenal syndrome: Creatinine worsening despite being fluid overloaded on IV diuresis.  hyperKalemia: She was treated with Kayexalate now resolved.  Diabetes mellitus type 2 with diabetic neuropathy: With an A1c of 6.8 no change made to her regimen.   Discharge Instructions  Discharge Instructions    Ambulatory referral to Neurology   Complete by: As directed    Follow up with Dr. Carles Collet at [facility] in 4 weeks.  Patient is Dr. Doristine Devoid patient. Thanks.   Diet - low sodium  heart healthy   Complete by: As directed    Increase activity slowly   Complete by: As directed       Allergies as of 03/24/2020      Reactions   Garlic Shortness Of Breath, Itching, Swelling, Other (See Comments)   "Raw garlic" = Hand itching and swelling   Isosorbide Other (See Comments)   Sneezing and runny nose   Latex Itching   Morphine And Related Itching, Other (See Comments)   Headache   Other Itching, Other (See Comments)   Reaction to newspaper ink- Headaches, also      Medication List    STOP taking these medications   allopurinol 100 MG tablet Commonly known as: ZYLOPRIM   benzonatate 100 MG capsule Commonly known as: TESSALON   DULoxetine 30 MG capsule Commonly known as: CYMBALTA   fluticasone 50 MCG/ACT nasal spray Commonly known as: FLONASE   gabapentin 100 MG capsule Commonly known as: NEURONTIN   hydrALAZINE 25 MG tablet Commonly known as: APRESOLINE   hydrOXYzine 25 MG tablet Commonly known as: ATARAX/VISTARIL   lansoprazole 15 MG capsule Commonly known as: PREVACID   LORazepam 0.5 MG tablet Commonly known as: ATIVAN Replaced by: LORazepam 2 MG/ML injection   morphine 20 MG/ML concentrated solution Commonly known as: ROXANOL Replaced by: morphine 10 MG/5ML solution   polyethylene glycol 17 g packet Commonly known as: MIRALAX / GLYCOLAX   potassium chloride SA 20 MEQ tablet Commonly known as: KLOR-CON   primidone 50 MG tablet Commonly known as: MYSOLINE   promethazine 25 MG tablet Commonly known as: PHENERGAN   torsemide 20 MG tablet Commonly known as: DEMADEX     TAKE these medications   LORazepam 2 MG/ML injection Commonly known as: ATIVAN Inject 0.25-0.5 mLs (0.5-1 mg total) into the vein every hour as needed for anxiety or seizure. Replaces: LORazepam 0.5 MG tablet   morphine 10 MG/5ML solution Take 1.3 mLs (2.6 mg total) by mouth every hour as needed for severe pain (dyspnea). Replaces: morphine 20 MG/ML concentrated solution   morphine 2 MG/ML injection Inject 1-2 mLs (2-4 mg total) into the vein every hour as  needed (for SOB).       Follow-up Information    [name], MD.   Specialty: Family Medicine Contact information: [place] [address] [phone]        Ludwig Clarks, DO. Schedule an appointment as soon as possible for a visit in 4 week(s).   Specialty: Neurology Contact information: [address]  [address] [place] [address] [phone]              Allergies  Allergen Reactions  . Garlic Shortness Of Breath, Itching, Swelling and Other (See Comments)    "Raw garlic" = Hand itching and swelling  . Isosorbide Other (See Comments)    Sneezing and runny nose  . Latex Itching  . Morphine And Related Itching and Other (See Comments)    Headache   . Other Itching and Other (See Comments)    Reaction to newspaper ink- Headaches, also    Consultations:  Neurology   Procedures/Studies: CT Angio Head W/Cm &/Or Wo Cm  Result Date: 03/30/2020 CLINICAL DATA:  Intractable headache. EXAM: CT ANGIOGRAPHY HEAD AND NECK TECHNIQUE: Multidetector CT imaging of the head and neck was performed using the standard protocol during bolus administration of intravenous contrast. Multiplanar CT image reconstructions and MIPs were obtained to evaluate the vascular anatomy. Carotid stenosis measurements (when applicable) are  obtained utilizing NASCET criteria, using the distal internal carotid diameter as the denominator. CONTRAST:  60m OMNIPAQUE IOHEXOL 350 MG/ML SOLN COMPARISON:  None. FINDINGS: CT HEAD FINDINGS Brain: No evidence of acute infarction, hemorrhage, hydrocephalus, extra-axial collection or mass lesion/mass effect. Vascular: No hyperdense vessel. Prominent calcified plaques in the bilateral carotid siphons. Skull: Normal. Negative for fracture or focal lesion. Sinuses: Bilateral mastoid effusion. Orbits: Right lens surgery.  No acute findings. Review of the MIP images confirms the above findings CTA NECK FINDINGS Aortic arch: Common  origin of the innominate and left common carotid artery from the aortic arch. Imaged portion shows no evidence of aneurysm or dissection. Calcified plaques are noted in the aortic arch and at the origin of the left subclavian artery without significant stenosis. Right carotid system: Calcified atherosclerotic plaque in the right carotid bifurcation without hemodynamically significant stenosis. There is increased tortuosity of the cervical segment of the right ICA. Left carotid system: Mild atherosclerotic plaques in the left common carotid artery and left carotid bifurcation without hemodynamically significant stenosis. Vertebral arteries: Codominant. No evidence of dissection, stenosis (50% or greater) or occlusion in the neck. Skeleton: Degenerative changes of the cervical spine. No aggressive bone lesion or acute finding. Other neck: Mildly heterogeneous thyroid gland with a 6 mm partially calcified nodule in the left thyroid lobe. Upper chest: Bilateral ground-glass opacities, right greater than left. Multiple lymph nodes in the superior mediastinum with a precarinal lymph node measuring 11 mm. Bilateral pleural effusion, right greater than left. Review of the MIP images confirms the above findings CTA HEAD FINDINGS Anterior circulation: Calcified plaques in the bilateral carotid siphons with mild stenosis at the paraclinoid segment bilaterally. Hypoplastic right A1/ACA with dominant left A1/ACA segment. Two short segments of occlusion within and M4 segment of the left MCA posterior division branch (series 12, image 15). The bilateral ACA and MCA vascular trees are otherwise maintained. Posterior circulation: Partially calcified atherosclerotic plaques in the intracranial right vertebral artery resulting in severe stenosis. The intracranial left vertebral artery is maintained. The basilar artery and posterior cerebral arteries have normal course and caliber. Venous sinuses: As permitted by contrast timing,  patent. Anatomic variants: Hypoplastic right A1/ACA. Review of the MIP images confirms the above findings IMPRESSION: 1. No acute intracranial abnormality. 2. Two short segments of occlusion within and M4 segment of the left MCA posterior division branch. This may be secondary to intracranial atherosclerotic disease versus embolic. In case of high clinical suspicion for acute infarct, an MRI of the brain suggested. 3. Severe stenosis of the intracranial right vertebral artery. 4. No hemodynamically significant stenosis in the neck. 5. Bilateral pleural effusion, right greater than left. 6. Bilateral ground-glass opacity of the visualized lungs, mostly on the right side. 7. Aortic atherosclerosis. These results were called by telephone at the time of interpretation on 03/26/2020 at 3:31 pm to [facility], who verbally acknowledged these results. Aortic Atherosclerosis (ICD10-I70.0). Electronically Signed   By: KPedro EarlsM.D.   On: 03/16/2020 15:34   DG Chest 1 View  Result Date: 03/07/2020 CLINICAL DATA:  Pain following fall EXAM: CHEST  1 VIEW COMPARISON:  November 27, 2019 FINDINGS: There is airspace consolidation in the left base. There are small pleural effusions bilaterally. There is cardiomegaly with pulmonary vascularity normal. No adenopathy. No pneumothorax. There is aortic atherosclerosis. No bone lesions. IMPRESSION: Airspace opacity left lower lobe concerning for potential pneumonia. Small pleural effusions bilaterally. Cardiomegaly. Note that there may be a degree of underlying  congestive heart failure. Electronically Signed   By: Lowella Grip III M.D.   On: 03/27/2020 10:26   DG Chest 2 View  Result Date: 03/18/2020 CLINICAL DATA:  64 year old female with acute on chronic respiratory failure and hypoxia EXAM: CHEST - 2 VIEW COMPARISON:  03/23/2020 FINDINGS: Cardiomediastinal silhouette unchanged. Low lung volumes persist. Opacity at the right lung base  persists with partial obscuration of the right hemidiaphragm and thickening of the pleuroparenchymal interface. Opacity in the minor fissure persists. Air bronchograms persist at the left lung base. Meniscus on the lateral view. No pneumothorax. Coarsened interstitial markings. IMPRESSION: Low lung volumes and persistent opacity at the right lung base, a combination of partially loculated pleural fluid and atelectasis/consolidation. Left lower lobe lobar pneumonia not excluded given the suggestion of air bronchograms. Electronically Signed   By: Corrie Mckusick D.O.   On: 03/18/2020 08:16   CT Head Wo Contrast  Result Date: 03/15/2020 CLINICAL DATA:  Pain following fall.  Reported delirium EXAM: CT HEAD WITHOUT CONTRAST TECHNIQUE: Contiguous axial images were obtained from the base of the skull through the vertex without intravenous contrast. COMPARISON:  January 28, 2019 FINDINGS: Brain: There is mild diffuse atrophy. There is no intracranial mass, hemorrhage, extra-axial fluid collection, or midline shift. Brain parenchyma appears unremarkable. No appreciable acute infarct. Vascular: No hyperdense vessel. There is calcification in each distal vertebral artery and carotid siphon region. Skull: Bony calvarium appears intact. Sinuses/Orbits: There is mucosal thickening in several ethmoid air cells. Other visualized paranasal sinuses are clear. Status post cataract removal on the right. Orbits otherwise appear symmetric bilaterally. Other: Opacification of multiple mastoid air cells noted. IMPRESSION: Atrophy with periventricular small vessel disease, stable. No mass or hemorrhage. Multiple foci of arterial vascular calcification. Opacification of multiple mastoid air cells noted. Mucosal thickening in several ethmoid air cells. Electronically Signed   By: Lowella Grip III M.D.   On: 03/21/2020 10:33   CT Angio Neck W and/or Wo Contrast  Result Date: 03/24/2020 CLINICAL DATA:  Intractable headache. EXAM:  CT ANGIOGRAPHY HEAD AND NECK TECHNIQUE: Multidetector CT imaging of the head and neck was performed using the standard protocol during bolus administration of intravenous contrast. Multiplanar CT image reconstructions and MIPs were obtained to evaluate the vascular anatomy. Carotid stenosis measurements (when applicable) are obtained utilizing NASCET criteria, using the distal internal carotid diameter as the denominator. CONTRAST:  62mL OMNIPAQUE IOHEXOL 350 MG/ML SOLN COMPARISON:  None. FINDINGS: CT HEAD FINDINGS Brain: No evidence of acute infarction, hemorrhage, hydrocephalus, extra-axial collection or mass lesion/mass effect. Vascular: No hyperdense vessel. Prominent calcified plaques in the bilateral carotid siphons. Skull: Normal. Negative for fracture or focal lesion. Sinuses: Bilateral mastoid effusion. Orbits: Right lens surgery.  No acute findings. Review of the MIP images confirms the above findings CTA NECK FINDINGS Aortic arch: Common origin of the innominate and left common carotid artery from the aortic arch. Imaged portion shows no evidence of aneurysm or dissection. Calcified plaques are noted in the aortic arch and at the origin of the left subclavian artery without significant stenosis. Right carotid system: Calcified atherosclerotic plaque in the right carotid bifurcation without hemodynamically significant stenosis. There is increased tortuosity of the cervical segment of the right ICA. Left carotid system: Mild atherosclerotic plaques in the left common carotid artery and left carotid bifurcation without hemodynamically significant stenosis. Vertebral arteries: Codominant. No evidence of dissection, stenosis (50% or greater) or occlusion in the neck. Skeleton: Degenerative changes of the cervical spine. No aggressive bone lesion or acute  finding. Other neck: Mildly heterogeneous thyroid gland with a 6 mm partially calcified nodule in the left thyroid lobe. Upper chest: Bilateral ground-glass  opacities, right greater than left. Multiple lymph nodes in the superior mediastinum with a precarinal lymph node measuring 11 mm. Bilateral pleural effusion, right greater than left. Review of the MIP images confirms the above findings CTA HEAD FINDINGS Anterior circulation: Calcified plaques in the bilateral carotid siphons with mild stenosis at the paraclinoid segment bilaterally. Hypoplastic right A1/ACA with dominant left A1/ACA segment. Two short segments of occlusion within and M4 segment of the left MCA posterior division branch (series 12, image 15). The bilateral ACA and MCA vascular trees are otherwise maintained. Posterior circulation: Partially calcified atherosclerotic plaques in the intracranial right vertebral artery resulting in severe stenosis. The intracranial left vertebral artery is maintained. The basilar artery and posterior cerebral arteries have normal course and caliber. Venous sinuses: As permitted by contrast timing, patent. Anatomic variants: Hypoplastic right A1/ACA. Review of the MIP images confirms the above findings IMPRESSION: 1. No acute intracranial abnormality. 2. Two short segments of occlusion within and M4 segment of the left MCA posterior division branch. This may be secondary to intracranial atherosclerotic disease versus embolic. In case of high clinical suspicion for acute infarct, an MRI of the brain suggested. 3. Severe stenosis of the intracranial right vertebral artery. 4. No hemodynamically significant stenosis in the neck. 5. Bilateral pleural effusion, right greater than left. 6. Bilateral ground-glass opacity of the visualized lungs, mostly on the right side. 7. Aortic atherosclerosis. These results were called by telephone at the time of interpretation on 03/21/2020 at 3:31 pm to provider [facility] , who verbally acknowledged these results. Aortic Atherosclerosis (ICD10-I70.0). Electronically Signed   By: Pedro Earls M.D.   On:  03/11/2020 15:34   MR BRAIN WO CONTRAST  Result Date: 03/18/2020 CLINICAL DATA:  64 year old female with severe headache. Distal right vertebral artery severe stenosis and abnormal distal left MCA branches on recent CTA. EXAM: MRI HEAD WITHOUT CONTRAST TECHNIQUE: Multiplanar, multiecho pulse sequences of the brain and surrounding structures were obtained without intravenous contrast. COMPARISON:  CT head, CTA head and neck 03/21/2020 FINDINGS: Brain: Mild to moderate gyriform restricted diffusion in the posterior left MCA territory, lateral postcentral parietal lobe cortex on series 5, image 83, and also left posterior insula (image 77). No other restricted diffusion. Some T2 shine through suspected in the splenium. T2 and FLAIR hyperintense mild cytotoxic edema at the restricted areas. No associated acute hemorrhage or mass effect. Patchy and confluent superimposed bilateral cerebral white matter T2 and FLAIR hyperintensity, including some direct involvement and thinning of the corpus callosum. Chronic microhemorrhage in the right frontal lobe subcortical white matter on series 14, image 35. Moderate similar T2 and FLAIR hyperintensity in the pons. Deep gray nuclei and cerebellum remain within normal limits. No midline shift, mass effect, evidence of mass lesion, ventriculomegaly, extra-axial collection or acute intracranial hemorrhage. Cervicomedullary junction and pituitary are within normal limits. Vascular: Major intracranial vascular flow voids are preserved. Skull and upper cervical spine: Multilevel cervical spine degeneration, up to mild degenerative spinal stenosis. Visualized bone marrow signal is within normal limits. Sinuses/Orbits: Postoperative changes to the right globe. Negative paranasal sinuses. Other: Bilateral mastoid air cell effusions. Negative nasopharynx. Other visible internal auditory structures appear grossly normal. IMPRESSION: 1. Small, patchy acute infarcts in posterior Left MCA  territory cortex. No associated hemorrhage or mass effect. 2. Underlying advanced signal changes in the cerebral white matter and  pons, nonspecific but most commonly due to chronic small vessel disease. Solitary chronic microhemorrhage in the right frontal lobe. 3. Bilateral mastoid air cell effusions, probably postinflammatory. Electronically Signed   By: Genevie Ann M.D.   On: 03/18/2020 06:37   DG CHEST PORT 1 VIEW  Result Date: 03/20/2020 CLINICAL DATA:  Acute on chronic respiratory failure with hypoxia. EXAM: PORTABLE CHEST 1 VIEW COMPARISON:  Radiograph 03/18/2020. Most recent CT 01/31/2019 FINDINGS: Improving lung volumes. Persistent but improving pleural effusions. Pulmonary edema appears similar. Cardiomegaly is similar. No new or progressive airspace disease. No pneumothorax. No acute osseous abnormalities are seen. IMPRESSION: 1. Improving lung volumes. Persistent but improving pleural effusions. 2. Similar pulmonary edema and cardiomegaly. Electronically Signed   By: Keith Rake M.D.   On: 03/20/2020 23:00   DG Knee Complete 4 Views Right  Result Date: 03/11/2020 CLINICAL DATA:  Pain following fall EXAM: RIGHT KNEE - COMPLETE 4+ VIEW COMPARISON:  None. FINDINGS: Frontal, lateral, and bilateral oblique views were obtained. There is no fracture, dislocation, or joint effusion. There is spurring in all compartments. There is mild narrowing medially. No erosion. IMPRESSION: Areas of osteoarthritic change. No appreciable fracture, dislocation, or joint effusion. Electronically Signed   By: Lowella Grip III M.D.   On: 03/31/2020 10:28   DG HIP UNILAT WITH PELVIS 2-3 VIEWS RIGHT  Result Date: 03/18/2020 CLINICAL DATA:  Pain following fall EXAM: DG HIP (WITH OR WITHOUT PELVIS) 2-3V RIGHT COMPARISON:  None. FINDINGS: Frontal pelvis as well as frontal and lateral right hip images were obtained. There is no fracture or dislocation. There is moderate symmetric narrowing of each hip joint. No  erosive change. IMPRESSION: No acute fracture or dislocation. Moderate symmetric narrowing each hip joint. Electronically Signed   By: Lowella Grip III M.D.   On: 03/29/2020 10:26   VAS [place] LOWER EXTREMITY VENOUS (DVT)  Result Date: 03/18/2020  Lower Venous DVT Study Indications: Stroke.  Risk Factors: None identified. Limitations: Poor ultrasound/tissue interface and patient pain tolerance. Comparison Study: No prior studies. Performing Technologist: Oliver Hum RVT  Examination Guidelines: A complete evaluation includes B-mode imaging, spectral Doppler, color Doppler, and power Doppler as needed of all accessible portions of each vessel. Bilateral testing is considered an integral part of a complete examination. Limited examinations for reoccurring indications may be performed as noted. The reflux portion of the exam is performed with the patient in reverse Trendelenburg.  +---------+---------------+---------+-----------+----------+--------------+ RIGHT    CompressibilityPhasicitySpontaneityPropertiesThrombus Aging +---------+---------------+---------+-----------+----------+--------------+ CFV      Full           Yes      Yes                                 +---------+---------------+---------+-----------+----------+--------------+ SFJ      Full                                                        +---------+---------------+---------+-----------+----------+--------------+ FV Prox  Full                                                        +---------+---------------+---------+-----------+----------+--------------+  FV Mid                  Yes      Yes                                 +---------+---------------+---------+-----------+----------+--------------+ FV Distal               Yes      Yes                                 +---------+---------------+---------+-----------+----------+--------------+ PFV      Full                                                         +---------+---------------+---------+-----------+----------+--------------+ POP      Full           Yes      Yes                                 +---------+---------------+---------+-----------+----------+--------------+ PTV      Full                                                        +---------+---------------+---------+-----------+----------+--------------+ PERO     Full                                                        +---------+---------------+---------+-----------+----------+--------------+   +---------+---------------+---------+-----------+----------+--------------+ LEFT     CompressibilityPhasicitySpontaneityPropertiesThrombus Aging +---------+---------------+---------+-----------+----------+--------------+ CFV      Full           Yes      Yes                                 +---------+---------------+---------+-----------+----------+--------------+ SFJ      Full                                                        +---------+---------------+---------+-----------+----------+--------------+ FV Prox  Full                                                        +---------+---------------+---------+-----------+----------+--------------+ FV Mid                  Yes      Yes                                 +---------+---------------+---------+-----------+----------+--------------+  FV Distal               Yes      Yes                                 +---------+---------------+---------+-----------+----------+--------------+ PFV      Full                                                        +---------+---------------+---------+-----------+----------+--------------+ POP      Full           Yes      Yes                                 +---------+---------------+---------+-----------+----------+--------------+ PTV      Full                                                         +---------+---------------+---------+-----------+----------+--------------+ PERO     Full                                                        +---------+---------------+---------+-----------+----------+--------------+     Summary: RIGHT: - There is no evidence of deep vein thrombosis in the lower extremity. However, portions of this examination were limited- see technologist comments above.  - No cystic structure found in the popliteal fossa.  LEFT: - There is no evidence of deep vein thrombosis in the lower extremity. However, portions of this examination were limited- see technologist comments above.  - No cystic structure found in the popliteal fossa.  *See table(s) above for measurements and observations. Electronically signed by Harold Barban MD on 03/18/2020 at 10:02:38 PM.    Final    ECHOCARDIOGRAM LIMITED  Result Date: 03/18/2020    ECHOCARDIOGRAM LIMITED REPORT   Patient Name:   MADISON DIRENZO Date of Exam: 03/18/2020 Medical Rec #:  [id number]       Height:       59.0 in Accession #:    [id number]      Weight:       145.1 lb Date of Birth:  06/02/1955       BSA:          1.609 m Patient Age:    56 years        BP:           122/86 mmHg Patient Gender: F               HR:           121 bpm. Exam Location:  Inpatient Procedure: Limited Echo Indications:    CVA  History:        Patient has prior history of Echocardiogram examinations, most                 recent 09/27/2019. CHF, Previous Myocardial Infarction, Stroke,  Signs/Symptoms:Shortness of Breath and Chest Pain; Risk                 Factors:Diabetes, Hypertension and Dyslipidemia. Pleural                 effusions.  Sonographer:    Dustin Flock Referring Phys: [id number] [place]  Sonographer Comments: Limited echo-HR elevated. IMPRESSIONS  1. Left ventricular ejection fraction, by estimation, is 20 to 25%. The left ventricle has severely decreased function. The left ventricle demonstrates regional wall motion  abnormalities (see scoring diagram/findings for description). The left ventricular internal cavity size was mildly dilated. There is akinesis of the left ventricular, entire anterior wall, anterolateral wall and apical segment. There is akinesis of the left ventricular, mid-apical lateral wall, inferolateral wall and inferior wall. There is akinesis of the left ventricular, basal-mid anteroseptal wall and inferoseptal wall.  2. Mild mitral valve regurgitation.  3. Large pleural effusion in the left lateral region.  4. The inferior vena cava is normal in size with <50% respiratory variability, suggesting right atrial pressure of 8 mmHg.  5. Tricuspid regurgitation signal is inadequate for assessing PA pressure. FINDINGS  Left Ventricle: Left ventricular ejection fraction, by estimation, is 20 to 25%. The left ventricle has severely decreased function. The left ventricle demonstrates regional wall motion abnormalities. The left ventricular internal cavity size was mildly  dilated. Right Ventricle: Tricuspid regurgitation signal is inadequate for assessing PA pressure. Mitral Valve: Mild mitral valve regurgitation. Tricuspid Valve: Tricuspid valve regurgitation is mild. Venous: The inferior vena cava is normal in size with less than 50% respiratory variability, suggesting right atrial pressure of 8 mmHg. Additional Comments: There is a large pleural effusion in the left lateral region. LEFT VENTRICLE PLAX 2D LVIDd:         5.40 cm LVIDs:         4.90 cm LV PW:         1.20 cm LV IVS:        0.90 cm  LV Volumes (MOD) LV vol d, MOD A4C: 147.0 ml LV vol s, MOD A4C: 117.0 ml LV SV MOD A4C:     147.0 ml LEFT ATRIUM         Index LA diam:    3.70 cm 2.30 cm/m Fransico Him MD Electronically signed by Fransico Him MD Signature Date/Time: 03/18/2020/3:57:57 PM    Final      Subjective: No new complaints.  Discharge Exam: Vitals:   03/23/20 1121 03/23/20 1952  BP: 130/84 127/88  Pulse: 95 93  Resp: 20 18  Temp: (!)  97.5 F (36.4 C) 98.7 F (37.1 C)  SpO2: 98% 100%   Vitals:   03/22/20 0853 03/22/20 1948 03/23/20 1121 03/23/20 1952  BP:  119/79 130/84 127/88  Pulse:  96 95 93  Resp:  20 20 18   Temp: 97.8 F (36.6 C) 97.9 F (36.6 C) (!) 97.5 F (36.4 C) 98.7 F (37.1 C)  TempSrc:  Oral Oral Oral  SpO2: 93% 94% 98% 100%  Weight:      Height:        General: Pt is alert, awake, not in acute distress Cardiovascular: RRR, S1/S2 +, no rubs, no gallops Respiratory: CTA bilaterally, no wheezing, no rhonchi Abdominal: Soft, NT, ND, bowel sounds + Extremities: no edema, no cyanosis    The results of significant diagnostics from this hospitalization (including imaging, microbiology, ancillary and laboratory) are listed below for reference.     Microbiology: Recent Results (from  the past 240 hour(s))  Urine culture     Status: None   Collection Time: 03/11/2020  9:51 AM   Specimen: Urine, Random  Result Value Ref Range Status   Specimen Description URINE, RANDOM  Final   Special Requests NONE  Final   Culture   Final    NO GROWTH Performed at [facility], [address] [place], [place] [address]    Report Status 03/18/2020 FINAL  Final  Resp Panel by RT-PCR (Flu A&B, Covid) Nasopharyngeal Swab     Status: None   Collection Time: 03/20/2020 10:34 AM   Specimen: Nasopharyngeal Swab; Nasopharyngeal(NP) swabs in vial transport medium  Result Value Ref Range Status   SARS Coronavirus 2 by RT PCR NEGATIVE NEGATIVE Final    Comment: (NOTE) SARS-CoV-2 target nucleic acids are NOT DETECTED.  The SARS-CoV-2 RNA is generally detectable in upper respiratory specimens during the acute phase of infection. The lowest concentration of SARS-CoV-2 viral copies this assay can detect is 138 copies/mL. A negative result does not preclude SARS-Cov-2 infection and should not be used as the sole basis for treatment or other patient management decisions. A negative result may occur with  improper  specimen collection/handling, submission of specimen other than nasopharyngeal swab, presence of viral mutation(s) within the areas targeted by this assay, and inadequate number of viral copies(<138 copies/mL). A negative result must be combined with clinical observations, patient history, and epidemiological information. The expected result is Negative.  Fact Sheet for Patients:  EntrepreneurPulse.com.au  Fact Sheet for Healthcare Providers:  IncredibleEmployment.be  This test is no t yet approved or cleared by the [place] FDA and  has been authorized for detection and/or diagnosis of SARS-CoV-2 by FDA under an Emergency Use Authorization (EUA). This EUA will remain  in effect (meaning this test can be used) for the duration of the COVID-19 declaration under Section 564(b)(1) of the Act, 21 U.S.C.section 360bbb-3(b)(1), unless the authorization is terminated  or revoked sooner.       Influenza A by PCR NEGATIVE NEGATIVE Final   Influenza B by PCR NEGATIVE NEGATIVE Final    Comment: (NOTE) The Xpert Xpress SARS-CoV-2/FLU/RSV plus assay is intended as an aid in the diagnosis of influenza from Nasopharyngeal swab specimens and should not be used as a sole basis for treatment. Nasal washings and aspirates are unacceptable for Xpert Xpress SARS-CoV-2/FLU/RSV testing.  Fact Sheet for Patients: EntrepreneurPulse.com.au  Fact Sheet for Healthcare Providers: IncredibleEmployment.be  This test is not yet approved or cleared by the [place] FDA and has been authorized for detection and/or diagnosis of SARS-CoV-2 by FDA under an Emergency Use Authorization (EUA). This EUA will remain in effect (meaning this test can be used) for the duration of the COVID-19 declaration under Section 564(b)(1) of the Act, 21 U.S.C. section 360bbb-3(b)(1), unless the authorization is terminated or revoked.  Performed at  [facility], [place] [address] [place], [place] [address]   Culture, blood (routine x 2) Call MD if unable to obtain prior to antibiotics being given     Status: None (Preliminary result)   Collection Time: 03/21/20  8:45 AM   Specimen: BLOOD  Result Value Ref Range Status   Specimen Description BLOOD LEFT ANTECUBITAL  Final   Special Requests   Final    BOTTLES DRAWN AEROBIC ONLY Blood Culture adequate volume   Culture   Final    NO GROWTH 3 DAYS Performed at [facility], [place],  [place] [address]    Report Status PENDING  Incomplete  Culture, blood (Routine X 2) w Reflex to ID Panel     Status: None (Preliminary result)   Collection Time: 03/21/20  8:55 AM   Specimen: BLOOD  Result Value Ref Range Status   Specimen Description BLOOD RIGHT ANTECUBITAL  Final   Special Requests   Final    BOTTLES DRAWN AEROBIC ONLY Blood Culture adequate volume   Culture   Final    NO GROWTH 3 DAYS Performed at [facility], [address] [place], [place] [address]    Report Status PENDING  Incomplete     Labs: BNP (last 3 results) Recent Labs    11/27/19 1440 03/23/2020 0926 03/21/20 0821  BNP 1,337.9* 3,886.5* 0,109.3*   Basic Metabolic Panel: Recent Labs  Lab 03/12/2020 1458 03/18/20 0311 03/19/20 0236 03/20/20 0254 03/21/20 0334 03/22/20 0942 03/23/20 1944  NA  --  139 139 138 138 134* 136  K  --  4.3 4.0 3.9 4.3 5.4* 5.2*  CL  --  102 102 102 102 100 99  CO2  --  24 26 25 26  19* 25  GLUCOSE  --  121* 73 120* 97 132* 153*  BUN  --  38* 37* 38* 39* 46* 47*  CREATININE  --  2.06* 2.54* 2.92* 3.33* 3.63* 3.59*  CALCIUM  --  8.9 8.9 8.7* 8.9 8.9 8.8*  MG 1.8 2.3 2.1 2.1  --   --   --    Liver Function Tests: Recent Labs  Lab 03/24/2020 0926  AST 10*  ALT 7  ALKPHOS 106  BILITOT 0.8  PROT 5.7*  ALBUMIN 2.6*   Recent Labs  Lab 03/12/2020 0926  LIPASE 15   No results for input(s): AMMONIA in the last 168 hours. CBC: Recent Labs   Lab 03/09/2020 0926 03/18/20 0311 03/19/20 0236 03/20/20 0254 03/21/20 0821  WBC 6.0 6.3 5.1 5.5 5.1  NEUTROABS 4.9  --   --   --  3.7  HGB 11.7* 11.3* 11.3* 11.0* 11.5*  HCT 39.7 37.3 37.5 36.3 38.3  MCV 90.2 88.8 89.3 88.5 89.3  PLT 266 218 228 236 257   Cardiac Enzymes: No results for input(s): CKTOTAL, CKMB, CKMBINDEX, TROPONINI in the last 168 hours. BNP: Invalid input(s): POCBNP CBG: Recent Labs  Lab 03/08/2020 1706  GLUCAP 85   D-Dimer No results for input(s): DDIMER in the last 72 hours. Hgb A1c No results for input(s): HGBA1C in the last 72 hours. Lipid Profile No results for input(s): CHOL, HDL, LDLCALC, TRIG, CHOLHDL, LDLDIRECT in the last 72 hours. Thyroid function studies No results for input(s): TSH, T4TOTAL, T3FREE, THYROIDAB in the last 72 hours.  Invalid input(s): FREET3 Anemia work up No results for input(s): VITAMINB12, FOLATE, FERRITIN, TIBC, IRON, RETICCTPCT in the last 72 hours. Urinalysis    Component Value Date/Time   COLORURINE YELLOW 03/24/2020 0929   APPEARANCEUR CLEAR 03/31/2020 0929   LABSPEC 1.011 03/18/2020 0929   PHURINE 5.0 03/16/2020 0929   GLUCOSEU 50 (A) 03/22/2020 0929   HGBUR SMALL (A) 03/25/2020 0929   BILIRUBINUR NEGATIVE 03/26/2020 0929   BILIRUBINUR negative 12/13/2017 1514   KETONESUR NEGATIVE 03/16/2020 0929   PROTEINUR >=300 (A) 03/12/2020 0929   UROBILINOGEN 0.2 12/13/2017 1514   NITRITE NEGATIVE 03/18/2020 0929   LEUKOCYTESUR NEGATIVE 03/29/2020 0929   Sepsis Labs Invalid input(s): PROCALCITONIN,  WBC,  LACTICIDVEN Microbiology Recent Results (from the past 240 hour(s))  Urine culture  Status: None   Collection Time: 03/25/2020  9:51 AM   Specimen: Urine, Random  Result Value Ref Range Status   Specimen Description URINE, RANDOM  Final   Special Requests NONE  Final   Culture   Final    NO GROWTH Performed at [facility], [address], [place], [place] [address]    Report Status 03/18/2020 FINAL   Final  Resp Panel by RT-PCR (Flu A&B, Covid) Nasopharyngeal Swab     Status: None   Collection Time: 03/11/2020 10:34 AM   Specimen: Nasopharyngeal Swab; Nasopharyngeal(NP) swabs in vial transport medium  Result Value Ref Range Status   SARS Coronavirus 2 by RT PCR NEGATIVE NEGATIVE Final    Comment: (NOTE) SARS-CoV-2 target nucleic acids are NOT DETECTED.  The SARS-CoV-2 RNA is generally detectable in upper respiratory specimens during the acute phase of infection. The lowest concentration of SARS-CoV-2 viral copies this assay can detect is 138 copies/mL. A negative result does not preclude SARS-Cov-2 infection and should not be used as the sole basis for treatment or other patient management decisions. A negative result may occur with  improper specimen collection/handling, submission of specimen other than nasopharyngeal swab, presence of viral mutation(s) within the areas targeted by this assay, and inadequate number of viral copies(<138 copies/mL). A negative result must be combined with clinical observations, patient history, and epidemiological information. The expected result is Negative.  Fact Sheet for Patients:  EntrepreneurPulse.com.au  Fact Sheet for Healthcare Providers:  IncredibleEmployment.be  This test is no t yet approved or cleared by the [place] FDA and  has been authorized for detection and/or diagnosis of SARS-CoV-2 by FDA under an Emergency Use Authorization (EUA). This EUA will remain  in effect (meaning this test can be used) for the duration of the COVID-19 declaration under Section 564(b)(1) of the Act, 21 U.S.C.section 360bbb-3(b)(1), unless the authorization is terminated  or revoked sooner.       Influenza A by PCR NEGATIVE NEGATIVE Final   Influenza B by PCR NEGATIVE NEGATIVE Final    Comment: (NOTE) The Xpert Xpress SARS-CoV-2/FLU/RSV plus assay is intended as an aid in the diagnosis of influenza from  Nasopharyngeal swab specimens and should not be used as a sole basis for treatment. Nasal washings and aspirates are unacceptable for Xpert Xpress SARS-CoV-2/FLU/RSV testing.  Fact Sheet for Patients: EntrepreneurPulse.com.au  Fact Sheet for Healthcare Providers: IncredibleEmployment.be  This test is not yet approved or cleared by the [place] FDA and has been authorized for detection and/or diagnosis of SARS-CoV-2 by FDA under an Emergency Use Authorization (EUA). This EUA will remain in effect (meaning this test can be used) for the duration of the COVID-19 declaration under Section 564(b)(1) of the Act, 21 U.S.C. section 360bbb-3(b)(1), unless the authorization is terminated or revoked.  Performed at [facility], [place] [address], [place], [place] [address]   Culture, blood (routine x 2) Call MD if unable to obtain prior to antibiotics being given     Status: None (Preliminary result)   Collection Time: 03/21/20  8:45 AM   Specimen: BLOOD  Result Value Ref Range Status   Specimen Description BLOOD LEFT ANTECUBITAL  Final   Special Requests   Final    BOTTLES DRAWN AEROBIC ONLY Blood Culture adequate volume   Culture   Final    NO GROWTH 3 DAYS Performed at [facility], [place] [address], [place], [place] [address]    Report Status PENDING  Incomplete  Culture, blood (Routine X 2) w Reflex to ID Panel     Status: None (Preliminary result)   Collection Time: 03/21/20  8:55 AM   Specimen: BLOOD  Result Value Ref Range Status   Specimen Description BLOOD RIGHT ANTECUBITAL  Final   Special Requests   Final    BOTTLES DRAWN AEROBIC ONLY Blood Culture adequate volume   Culture   Final    NO GROWTH 3 DAYS Performed at [facility], [address] [place], [place] [address]    Report Status PENDING  Incomplete     Time coordinating discharge: Over 40 minutes  SIGNED:   Charlynne Cousins, MD  [facility] 03/24/2020, 8:35 AM Pager   If 7PM-7AM, please contact night-coverage [email or web address] Password [email or web address]

## 2020-03-24 NOTE — Progress Notes (Signed)
ReDS Clip Diuretic Study Pt study # [id number]  Your patient is in the Blinded arm of the ReDS Clip Diuretic study.  Attempted ReDS reading but was unsuccessful to get updated reading. Will use the reading from 12/17 that has been transmitted to the cloud.   Thank You   The research team   Kerby Nora, PharmD, BCPS Heart Failure Stewardship Pharmacist Phone [phone]  Please check [email or web address] for unit-specific pharmacist phone numbers

## 2020-03-24 NOTE — Progress Notes (Signed)
Manufacturing engineer [facility])  Ms. Mccardle is a current patient of [facility]. Family requesting transfer to [facility] for EOL care at [facility].  There is not a bed to offer today at [facility].  [facility] will update should our bed status change, but there is not a bed anticipated to open within the next few days.  Venia Carbon RN, BSN, [facility] Liaison

## 2020-03-25 DIAGNOSIS — N184 Chronic kidney disease, stage 4 (severe): Secondary | ICD-10-CM

## 2020-03-25 NOTE — Progress Notes (Signed)
Palliative Medicine RN Note: Symptom check. Patient is asleep but wakes easily, appears fearful, pulling the covers up to her face and avoiding my eyes. She settled back easily when not disturbed.   She denies pain. PMT will follow for symptoms tomorrow. She is at high risk for sudden onset of s/s pulm edema.  Marjie Skiff Keyli Duross, RN, BSN, [facility] Palliative Medicine Team 03/25/2020 8:15 AM Office [phone]

## 2020-03-25 NOTE — Progress Notes (Signed)
Palliative Medicine RN Note: Symptom check.  Pt is in bed with her O2 cannula crooked, awake, with her breakfast tray. I told her that her O2 is crooked, and she fixed it. She is breathing hard, over 30/minute, and when asked, she reports that she is having trouble breathing even w O2 but denies pain. Spoke w her RN, who will bring medication.  Patient remains on waitlist for [facility] and is still an active patient of [facility], as far as we know.  Marjie Skiff Margret Moat, RN, BSN, [facility] Palliative Medicine Team 03/25/2020 12:55 PM Office [phone]

## 2020-03-25 NOTE — Progress Notes (Signed)
TRH Progress note  Today's Vitals   03/24/20 1056 03/24/20 1950 03/24/20 2056 03/25/20 0900  BP: 123/84 121/87  119/83  Pulse: (!) 116 95  95  Resp: 17 18  20   Temp: (!) 97.4 F (36.3 C) (!) 97.4 F (36.3 C)  (!) 97 F (36.1 C)  TempSrc: Oral Oral  Oral  SpO2: 100% 95%  100%  Weight:      Height:      PainSc:   0-No pain    Body mass index is 31.12 kg/m.   I have examined this patient today who is awaiting a [facility] bed. She is awake and alert and appears comfortable. She has no complaints. Per SW, [facility] has not available beds.   Debbe Odea, MD

## 2020-03-25 NOTE — Progress Notes (Signed)
Manufacturing engineer [facility])  Ms. Anagnos is a current patient of [facility]. Family requesting transfer to [facility] for EOL care at [facility].  There is not a bed to offer today at [facility].  [facility] will update should our bed status change, but there is not a bed anticipated to open within the next few days.  A Please do not hesitate to call with questions.     Thank you,    Farrel Gordon, RN, [place] (listed on [facility] under [place])     [phone]

## 2020-03-26 LAB — CULTURE, BLOOD (ROUTINE X 2)
Culture: NO GROWTH
Culture: NO GROWTH
Special Requests: ADEQUATE
Special Requests: ADEQUATE

## 2020-03-26 MED ORDER — LORAZEPAM 2 MG/ML IJ SOLN: 1.0000 mg | INTRAMUSCULAR | Status: DC | PRN

## 2020-03-26 MED ORDER — HYDROMORPHONE HCL 1 MG/ML IJ SOLN
0.5000 mg | Freq: Three times a day (TID) | INTRAMUSCULAR | Status: DC
Start: 2020-03-26 — End: 2020-03-26

## 2020-03-26 MED ORDER — HYDROMORPHONE HCL 1 MG/ML IJ SOLN
1.0000 mg | INTRAMUSCULAR | Status: DC | PRN
  Administered 2020-03-26: 1 mg via INTRAVENOUS
  Filled 2020-03-26: qty 1

## 2020-03-26 MED ORDER — DIPHENHYDRAMINE HCL 50 MG/ML IJ SOLN: 25.0000 mg | Freq: Four times a day (QID) | INTRAMUSCULAR | Status: DC | PRN

## 2020-03-26 MED ORDER — KETOROLAC TROMETHAMINE 15 MG/ML IJ SOLN
15.0000 mg | Freq: Once | INTRAMUSCULAR | Status: AC
  Administered 2020-03-26: 09:00:00 15 mg via INTRAVENOUS
  Filled 2020-03-26: qty 1

## 2020-03-26 MED ORDER — HYDROMORPHONE HCL 1 MG/ML IJ SOLN: 1.0000 mg | INTRAMUSCULAR | Status: DC | PRN

## 2020-03-26 MED ORDER — HYDROMORPHONE HCL 1 MG/ML IJ SOLN
1.0000 mg | Freq: Three times a day (TID) | INTRAMUSCULAR | Status: DC
  Administered 2020-03-27 – 2020-03-28 (×4): 1 mg via INTRAVENOUS
  Filled 2020-03-26 (×4): qty 1

## 2020-03-26 NOTE — Progress Notes (Addendum)
Palliative Medicine RN Note: Symptom check.  Daughter at bedside. Patient c/o trouble breathing. RN will bring Dilaudid. Obtained orders to give Dilaudid ATC to prevent further dyspnea, and for pt to have prn Dilaudid. Also got an order for Benadryl in case pt begins to c/o itching with Dilaudid.  Will continue to follow for symptoms. No beds at [facility] today. Pt remains admitted to [facility].  Marjie Skiff Harlyn Italiano, RN, BSN, [facility] Palliative Medicine Team 03/26/2020 1:05 PM Office [phone]

## 2020-03-26 NOTE — Progress Notes (Signed)
[facility] ([facility])  There is not a bed to offer at [facility] today.  [facility] will update TOC and family once our bed availability changes, which likely will not be for several days.   Jennifer Woody RN, BSN, CCRN [facility] Hospital Liaison  

## 2020-03-26 NOTE — Progress Notes (Signed)
TRH Progress Note   Today's Vitals   03/26/20 1115 03/26/20 1140 03/26/20 1215 03/26/20 1348  BP:  (!) 96/56    Pulse:  93    Resp:  15    Temp:  98 F (36.7 C)    TempSrc:  Oral    SpO2:  (!) 89%    Weight:      Height:      PainSc: 0-No pain  Asleep Asleep   Body mass index is 31.12 kg/m.  She states she has pain "all over today". She has no other complaints. I have ordered medications for this.  Daughter is at the bedside and has no questions. We continue to wait on a [facility] bed.  Debbe Odea, MD

## 2020-03-27 NOTE — Progress Notes (Signed)
Palliative Medicine RN Note: Symptom check.  Upon my arrival, pt is markedly different from yesterday but does wake for the RN. She is having apnea of 15-20 seconds at times. Pulse is still strong. RN reports she is still having pain when awake, so it is a good palliative/comfort outcome that she is asleep.  Our team will continue to watch her. She is still waiting on a hospice bed and remains admitted to [facility].  Marjie Skiff Mikaya Bunner, RN, BSN, [facility] Palliative Medicine Team 03/27/2020 11:16 AM Office [phone]

## 2020-03-27 NOTE — Progress Notes (Signed)
While primary RN attempted to perform the in and out catherization for the patient, the patient urinated on her own. Primary RN re-bladder scan the patient and it showed 29ml. Patient expresses that she feels better.

## 2020-03-27 NOTE — Progress Notes (Signed)
[facility] ([facility]) Hospital Liaison note.    [facility] is unable to offer a room today. Hospital Liaison will follow up tomorrow or sooner if a room becomes available. Please do not hesitate to call with questions.    Thank you for the opportunity to participate in this patient's care.  Chrislyn King, BSN, RN [facility] Hospital Liaison (listed on AMION under [facility])    [phone]  

## 2020-03-27 NOTE — Progress Notes (Signed)
While feeding the patient, patient complained that she needs to pee. Bladder scan the patient and the volume is greater than 868ml. Paged Rizwan, MD to notify whether to perform an in and out intervention or foley intervention per comfort care measures.

## 2020-03-27 NOTE — Progress Notes (Signed)
TRH progress note  Today's Vitals   03/27/20 0825 03/27/20 0843 03/27/20 1156 03/27/20 1339  BP:  122/74    Pulse:  85    Resp:  (!) 21    Temp:  98.5 F (36.9 C)    TempSrc:  Oral    SpO2:  90%    Weight:      Height:      PainSc: 8   Asleep 0-No pain   Body mass index is 31.12 kg/m.  Left MCA infarct CKD4 Chronic systolic and diastolic CHF  Evaluated patient this AM. She had no complaints for me.  Still waiting on [facility] bed.   Debbe Odea, MD

## 2020-03-27 NOTE — Progress Notes (Signed)
Upon rounding, patient woke and became slightly agitated and wanted RN to feel her face. Patient verbalizes that she "felt hot" so RN applied cool damp washcloth around her face. RN noticed new scratch marks on the patient's L arm with scant serious lines. RN cleansed and dressed the area with foam gauze. Patient verbalizes she is not in any pain and does not want to eat lunch.

## 2020-03-31 ENCOUNTER — Telehealth: Payer: Self-pay

## 2020-03-31 NOTE — Telephone Encounter (Signed)
Call placed to [facility] and informed him that Dr Margarita Rana has signed the hospice paperwork. He requested that the documents be faxed to [facility] records fax # [phone]. Documents then faxed as requested.

## 2020-04-04 NOTE — Progress Notes (Signed)
   Palliative Medicine Inpatient Follow Up Note   Reason for Consultation: Establishing goals of care  HPI/Patient Profile: 65 y.o. female  with past medical history of combined systolic and diastolic CHF, chronic 4L oixygen, CKD stage 4, nonadherence with her medications, anxiety admitted on 04/02/2020 with headache, falls, increased shortness shortness of breath. Found to have left MCA stroke. Appeared to be returning to baseline.   Palliative care was asked to get involved in the setting of acute respiratory failure due to pulmonary edema.  Today's Discussion (03/28/2920 ): Chart reviewed.    I met with Kara Hanson this morning.  She was noted to be actively vomiting.  I was able to help get her a basin sit her up and stabilize her from that perspective.  I spoke to her evening nurse and requested that he provide her with an antiemetic.  I spoke with the nursing technician and requested that he clean the patient and provide her with a new gown.  Kara Hanson has not been on oxygen it appears throughout the evening.  Oxygen was removed she appears quite comfortable.  I spoke to the nursing staff about just managing symptoms of dyspnea with the Dilaudid which is ordered.  Patient remains to await a bed at [facility].  Questions and concerns addressed   Objective Assessment: Vital Signs Vitals:   03/27/20 0843 03/27/20 1917  BP: 122/74 (!) 132/97  Pulse: 85 91  Resp: (!) 21 20  Temp: 98.5 F (36.9 C) 97.9 F (36.6 C)  SpO2: 90% 98%    Intake/Output Summary (Last 24 hours) at 04/21/2020 0762 Last data filed at 03/27/2020 2220 Gross per 24 hour  Intake 660 ml  Output 300 ml  Net 360 ml   Last Weight  Most recent update: 03/22/2020  4:27 AM   Weight  69.9 kg (154 lb 1.6 oz)           Gen:  Older AA F appears younger than stated age 1: Dry mucous membranes CV: Regular rate and rhythm  PULM: On RA - fine crackles ABD: soft/nontender  EXT: (+) 3 pitting edema Neuro: Alert  and oriented x1  SUMMARY OF RECOMMENDATIONS   DNAR/DNI  Comfort focused care  Liberalize visitation policy  Encouraged patients pastor to come in  Awaiting a [facility] bed  Time Spent: 25 Greater than 50% of the time was spent in counseling and coordination of care ______________________________________________________________________________________ [place] Team Team Cell Phone: [phone] Please utilize secure chat with additional questions, if there is no response within 30 minutes please call the above phone number  Palliative Medicine Team providers are available by phone from 7am to 7pm daily and can be reached through the team cell phone.  Should this patient require assistance outside of these hours, please call the patient's attending physician.

## 2020-04-04 NOTE — Progress Notes (Signed)
[facility] ([facility]) Hospital Liaison note.   ° °[facility] is unable to offer a room today. Hospice liaison will update with any change. Please do not hesitate to call with questions.     °A Please do not hesitate to call with questions.     ° °Thank you,     °Mary Anne Robertson, RN, CCM        °[facility] Hospital Liaison (listed on [facility] under Hospice /[facility])      °[phone]  °

## 2020-04-04 NOTE — Death Summary Note (Signed)
DEATH SUMMARY   Patient Details  Name: Kara Hanson MRN: [id number] DOB: [birth year]  Admission/Discharge Information   Admit Date:  04/10/2020  Date of Death: Date of Death: 21-Apr-2020  Time of Death: Time of Death: 0845  Length of Stay: 2022-07-07  Referring Physician: [name], MD     Diagnoses  Preliminary cause of death:  Secondary Diagnoses (including complications and co-morbidities):  Principal Problem:   Acute on chronic combined systolic and diastolic CHF (congestive heart failure) ([place]) Active Problems:   Hypertension   Type 2 diabetes mellitus with diabetic neuropathy, unspecified ([place])   Acute on chronic respiratory failure with hypoxia (HCC)   Hypokalemia   CKD (chronic kidney disease), stage IV (HCC)   Headache   Middle cerebral artery stenosis, left   Brief Hospital Course (including significant findings, care, treatment, and services provided and events leading to death)  Kara Hanson is a 65 y.o. year old female who has chronic combined systolic and diastolic heart failure on 4 L supplemental oxygen for COPD, chronic kidney disease stage IV not adherence to her medication, mostly bedbound with severe deconditioning per family's report who has suffered 2 falls over the last 24 hours, comes to the ED for shortness of breath headaches and fall.   She was found afebrile saturating in the 60s chest x-ray was notable for left based air opacity and a small bilateral pleural effusion imaging showed no acute findings except except for CTA neck that showed 2 short segments of occlusion within the left MCA, BNP of 4000, troponins slightly elevated but flat neurology was consulted by the emergency room physician.    In regards to the CVA, it was noted that she had multiple patchy acute infarcts in the MCA territory which were thought to be embolic. She was started on Eliquis. Her deficits mainly included receptive and expressive aphasia.   She was treated for acute  respiratory failure from combined systolic CHF(EF 37-16%) but did not progress with diuretics. There was a question of whether she had developed cardiorenal syndrome as her Cr continued to rise and did not respond to treatments. The advanced heart failure team was consult and felt she was not a candidate for aggressive treatments.  After palliative care discussions, she was transitioned to hospice and the plan was to transition her to a hospice home as it was felt that her decline would be rapid.  She developed vomiting followed by dyspnea today and soon afterwards expired. I was notified by the RN.   Pertinent Labs and Studies  Significant Diagnostic Studies CT Angio Head W/Cm &/Or Wo Cm  Result Date: Apr 10, 2020 CLINICAL DATA:  Intractable headache. EXAM: CT ANGIOGRAPHY HEAD AND NECK TECHNIQUE: Multidetector CT imaging of the head and neck was performed using the standard protocol during bolus administration of intravenous contrast. Multiplanar CT image reconstructions and MIPs were obtained to evaluate the vascular anatomy. Carotid stenosis measurements (when applicable) are obtained utilizing NASCET criteria, using the distal internal carotid diameter as the denominator. CONTRAST:  68mL OMNIPAQUE IOHEXOL 350 MG/ML SOLN COMPARISON:  None. FINDINGS: CT HEAD FINDINGS Brain: No evidence of acute infarction, hemorrhage, hydrocephalus, extra-axial collection or mass lesion/mass effect. Vascular: No hyperdense vessel. Prominent calcified plaques in the bilateral carotid siphons. Skull: Normal. Negative for fracture or focal lesion. Sinuses: Bilateral mastoid effusion. Orbits: Right lens surgery.  No acute findings. Review of the MIP images confirms the above findings CTA NECK FINDINGS Aortic arch: Common origin of the innominate and left common carotid artery from the  aortic arch. Imaged portion shows no evidence of aneurysm or dissection. Calcified plaques are noted in the aortic arch and at the origin of the  left subclavian artery without significant stenosis. Right carotid system: Calcified atherosclerotic plaque in the right carotid bifurcation without hemodynamically significant stenosis. There is increased tortuosity of the cervical segment of the right ICA. Left carotid system: Mild atherosclerotic plaques in the left common carotid artery and left carotid bifurcation without hemodynamically significant stenosis. Vertebral arteries: Codominant. No evidence of dissection, stenosis (50% or greater) or occlusion in the neck. Skeleton: Degenerative changes of the cervical spine. No aggressive bone lesion or acute finding. Other neck: Mildly heterogeneous thyroid gland with a 6 mm partially calcified nodule in the left thyroid lobe. Upper chest: Bilateral ground-glass opacities, right greater than left. Multiple lymph nodes in the superior mediastinum with a precarinal lymph node measuring 11 mm. Bilateral pleural effusion, right greater than left. Review of the MIP images confirms the above findings CTA HEAD FINDINGS Anterior circulation: Calcified plaques in the bilateral carotid siphons with mild stenosis at the paraclinoid segment bilaterally. Hypoplastic right A1/ACA with dominant left A1/ACA segment. Two short segments of occlusion within and M4 segment of the left MCA posterior division branch (series 12, image 15). The bilateral ACA and MCA vascular trees are otherwise maintained. Posterior circulation: Partially calcified atherosclerotic plaques in the intracranial right vertebral artery resulting in severe stenosis. The intracranial left vertebral artery is maintained. The basilar artery and posterior cerebral arteries have normal course and caliber. Venous sinuses: As permitted by contrast timing, patent. Anatomic variants: Hypoplastic right A1/ACA. Review of the MIP images confirms the above findings IMPRESSION: 1. No acute intracranial abnormality. 2. Two short segments of occlusion within and M4 segment of  the left MCA posterior division branch. This may be secondary to intracranial atherosclerotic disease versus embolic. In case of high clinical suspicion for acute infarct, an MRI of the brain suggested. 3. Severe stenosis of the intracranial right vertebral artery. 4. No hemodynamically significant stenosis in the neck. 5. Bilateral pleural effusion, right greater than left. 6. Bilateral ground-glass opacity of the visualized lungs, mostly on the right side. 7. Aortic atherosclerosis. These results were called by telephone at the time of interpretation on 03/16/2020 at 3:31 pm to provider [facility] , who verbally acknowledged these results. Aortic Atherosclerosis (ICD10-I70.0). Electronically Signed   By: Pedro Earls M.D.   On: 03/29/2020 15:34   DG Chest 1 View  Result Date: 03/26/2020 CLINICAL DATA:  Pain following fall EXAM: CHEST  1 VIEW COMPARISON:  November 27, 2019 FINDINGS: There is airspace consolidation in the left base. There are small pleural effusions bilaterally. There is cardiomegaly with pulmonary vascularity normal. No adenopathy. No pneumothorax. There is aortic atherosclerosis. No bone lesions. IMPRESSION: Airspace opacity left lower lobe concerning for potential pneumonia. Small pleural effusions bilaterally. Cardiomegaly. Note that there may be a degree of underlying congestive heart failure. Electronically Signed   By: Lowella Grip III M.D.   On: 03/16/2020 10:26   DG Chest 2 View  Result Date: 03/18/2020 CLINICAL DATA:  65 year old female with acute on chronic respiratory failure and hypoxia EXAM: CHEST - 2 VIEW COMPARISON:  03/27/2020 FINDINGS: Cardiomediastinal silhouette unchanged. Low lung volumes persist. Opacity at the right lung base persists with partial obscuration of the right hemidiaphragm and thickening of the pleuroparenchymal interface. Opacity in the minor fissure persists. Air bronchograms persist at the left lung base. Meniscus on the  lateral view. No pneumothorax. Coarsened  interstitial markings. IMPRESSION: Low lung volumes and persistent opacity at the right lung base, a combination of partially loculated pleural fluid and atelectasis/consolidation. Left lower lobe lobar pneumonia not excluded given the suggestion of air bronchograms. Electronically Signed   By: Corrie Mckusick D.O.   On: 03/18/2020 08:16   CT Head Wo Contrast  Result Date: 03/26/2020 CLINICAL DATA:  Pain following fall.  Reported delirium EXAM: CT HEAD WITHOUT CONTRAST TECHNIQUE: Contiguous axial images were obtained from the base of the skull through the vertex without intravenous contrast. COMPARISON:  January 28, 2019 FINDINGS: Brain: There is mild diffuse atrophy. There is no intracranial mass, hemorrhage, extra-axial fluid collection, or midline shift. Brain parenchyma appears unremarkable. No appreciable acute infarct. Vascular: No hyperdense vessel. There is calcification in each distal vertebral artery and carotid siphon region. Skull: Bony calvarium appears intact. Sinuses/Orbits: There is mucosal thickening in several ethmoid air cells. Other visualized paranasal sinuses are clear. Status post cataract removal on the right. Orbits otherwise appear symmetric bilaterally. Other: Opacification of multiple mastoid air cells noted. IMPRESSION: Atrophy with periventricular small vessel disease, stable. No mass or hemorrhage. Multiple foci of arterial vascular calcification. Opacification of multiple mastoid air cells noted. Mucosal thickening in several ethmoid air cells. Electronically Signed   By: Lowella Grip III M.D.   On: 03/16/2020 10:33   CT Angio Neck W and/or Wo Contrast  Result Date: 03/06/2020 CLINICAL DATA:  Intractable headache. EXAM: CT ANGIOGRAPHY HEAD AND NECK TECHNIQUE: Multidetector CT imaging of the head and neck was performed using the standard protocol during bolus administration of intravenous contrast. Multiplanar CT image  reconstructions and MIPs were obtained to evaluate the vascular anatomy. Carotid stenosis measurements (when applicable) are obtained utilizing NASCET criteria, using the distal internal carotid diameter as the denominator. CONTRAST:  65mL OMNIPAQUE IOHEXOL 350 MG/ML SOLN COMPARISON:  None. FINDINGS: CT HEAD FINDINGS Brain: No evidence of acute infarction, hemorrhage, hydrocephalus, extra-axial collection or mass lesion/mass effect. Vascular: No hyperdense vessel. Prominent calcified plaques in the bilateral carotid siphons. Skull: Normal. Negative for fracture or focal lesion. Sinuses: Bilateral mastoid effusion. Orbits: Right lens surgery.  No acute findings. Review of the MIP images confirms the above findings CTA NECK FINDINGS Aortic arch: Common origin of the innominate and left common carotid artery from the aortic arch. Imaged portion shows no evidence of aneurysm or dissection. Calcified plaques are noted in the aortic arch and at the origin of the left subclavian artery without significant stenosis. Right carotid system: Calcified atherosclerotic plaque in the right carotid bifurcation without hemodynamically significant stenosis. There is increased tortuosity of the cervical segment of the right ICA. Left carotid system: Mild atherosclerotic plaques in the left common carotid artery and left carotid bifurcation without hemodynamically significant stenosis. Vertebral arteries: Codominant. No evidence of dissection, stenosis (50% or greater) or occlusion in the neck. Skeleton: Degenerative changes of the cervical spine. No aggressive bone lesion or acute finding. Other neck: Mildly heterogeneous thyroid gland with a 6 mm partially calcified nodule in the left thyroid lobe. Upper chest: Bilateral ground-glass opacities, right greater than left. Multiple lymph nodes in the superior mediastinum with a precarinal lymph node measuring 11 mm. Bilateral pleural effusion, right greater than left. Review of the MIP  images confirms the above findings CTA HEAD FINDINGS Anterior circulation: Calcified plaques in the bilateral carotid siphons with mild stenosis at the paraclinoid segment bilaterally. Hypoplastic right A1/ACA with dominant left A1/ACA segment. Two short segments of occlusion within and M4 segment of the left  MCA posterior division branch (series 12, image 15). The bilateral ACA and MCA vascular trees are otherwise maintained. Posterior circulation: Partially calcified atherosclerotic plaques in the intracranial right vertebral artery resulting in severe stenosis. The intracranial left vertebral artery is maintained. The basilar artery and posterior cerebral arteries have normal course and caliber. Venous sinuses: As permitted by contrast timing, patent. Anatomic variants: Hypoplastic right A1/ACA. Review of the MIP images confirms the above findings IMPRESSION: 1. No acute intracranial abnormality. 2. Two short segments of occlusion within and M4 segment of the left MCA posterior division branch. This may be secondary to intracranial atherosclerotic disease versus embolic. In case of high clinical suspicion for acute infarct, an MRI of the brain suggested. 3. Severe stenosis of the intracranial right vertebral artery. 4. No hemodynamically significant stenosis in the neck. 5. Bilateral pleural effusion, right greater than left. 6. Bilateral ground-glass opacity of the visualized lungs, mostly on the right side. 7. Aortic atherosclerosis. These results were called by telephone at the time of interpretation on 03/22/2020 at 3:31 pm to provider [facility] , who verbally acknowledged these results. Aortic Atherosclerosis (ICD10-I70.0). Electronically Signed   By: Pedro Earls M.D.   On: 03/13/2020 15:34   MR BRAIN WO CONTRAST  Result Date: 03/18/2020 CLINICAL DATA:  65 year old female with severe headache. Distal right vertebral artery severe stenosis and abnormal distal left MCA branches  on recent CTA. EXAM: MRI HEAD WITHOUT CONTRAST TECHNIQUE: Multiplanar, multiecho pulse sequences of the brain and surrounding structures were obtained without intravenous contrast. COMPARISON:  CT head, CTA head and neck 03/13/2020 FINDINGS: Brain: Mild to moderate gyriform restricted diffusion in the posterior left MCA territory, lateral postcentral parietal lobe cortex on series 5, image 83, and also left posterior insula (image 77). No other restricted diffusion. Some T2 shine through suspected in the splenium. T2 and FLAIR hyperintense mild cytotoxic edema at the restricted areas. No associated acute hemorrhage or mass effect. Patchy and confluent superimposed bilateral cerebral white matter T2 and FLAIR hyperintensity, including some direct involvement and thinning of the corpus callosum. Chronic microhemorrhage in the right frontal lobe subcortical white matter on series 14, image 35. Moderate similar T2 and FLAIR hyperintensity in the pons. Deep gray nuclei and cerebellum remain within normal limits. No midline shift, mass effect, evidence of mass lesion, ventriculomegaly, extra-axial collection or acute intracranial hemorrhage. Cervicomedullary junction and pituitary are within normal limits. Vascular: Major intracranial vascular flow voids are preserved. Skull and upper cervical spine: Multilevel cervical spine degeneration, up to mild degenerative spinal stenosis. Visualized bone marrow signal is within normal limits. Sinuses/Orbits: Postoperative changes to the right globe. Negative paranasal sinuses. Other: Bilateral mastoid air cell effusions. Negative nasopharynx. Other visible internal auditory structures appear grossly normal. IMPRESSION: 1. Small, patchy acute infarcts in posterior Left MCA territory cortex. No associated hemorrhage or mass effect. 2. Underlying advanced signal changes in the cerebral white matter and pons, nonspecific but most commonly due to chronic small vessel disease. Solitary  chronic microhemorrhage in the right frontal lobe. 3. Bilateral mastoid air cell effusions, probably postinflammatory. Electronically Signed   By: Genevie Ann M.D.   On: 03/18/2020 06:37   DG CHEST PORT 1 VIEW  Result Date: 03/20/2020 CLINICAL DATA:  Acute on chronic respiratory failure with hypoxia. EXAM: PORTABLE CHEST 1 VIEW COMPARISON:  Radiograph 03/18/2020. Most recent CT 01/31/2019 FINDINGS: Improving lung volumes. Persistent but improving pleural effusions. Pulmonary edema appears similar. Cardiomegaly is similar. No new or progressive airspace disease. No pneumothorax. No  acute osseous abnormalities are seen. IMPRESSION: 1. Improving lung volumes. Persistent but improving pleural effusions. 2. Similar pulmonary edema and cardiomegaly. Electronically Signed   By: Keith Rake M.D.   On: 03/20/2020 23:00   DG Knee Complete 4 Views Right  Result Date: 03/23/2020 CLINICAL DATA:  Pain following fall EXAM: RIGHT KNEE - COMPLETE 4+ VIEW COMPARISON:  None. FINDINGS: Frontal, lateral, and bilateral oblique views were obtained. There is no fracture, dislocation, or joint effusion. There is spurring in all compartments. There is mild narrowing medially. No erosion. IMPRESSION: Areas of osteoarthritic change. No appreciable fracture, dislocation, or joint effusion. Electronically Signed   By: Lowella Grip III M.D.   On: 03/31/2020 10:28   DG HIP UNILAT WITH PELVIS 2-3 VIEWS RIGHT  Result Date: 03/11/2020 CLINICAL DATA:  Pain following fall EXAM: DG HIP (WITH OR WITHOUT PELVIS) 2-3V RIGHT COMPARISON:  None. FINDINGS: Frontal pelvis as well as frontal and lateral right hip images were obtained. There is no fracture or dislocation. There is moderate symmetric narrowing of each hip joint. No erosive change. IMPRESSION: No acute fracture or dislocation. Moderate symmetric narrowing each hip joint. Electronically Signed   By: Lowella Grip III M.D.   On: 03/27/2020 10:26   VAS [place] LOWER EXTREMITY  VENOUS (DVT)  Result Date: 03/18/2020  Lower Venous DVT Study Indications: Stroke.  Risk Factors: None identified. Limitations: Poor ultrasound/tissue interface and patient pain tolerance. Comparison Study: No prior studies. Performing Technologist: Oliver Hum RVT  Examination Guidelines: A complete evaluation includes B-mode imaging, spectral Doppler, color Doppler, and power Doppler as needed of all accessible portions of each vessel. Bilateral testing is considered an integral part of a complete examination. Limited examinations for reoccurring indications may be performed as noted. The reflux portion of the exam is performed with the patient in reverse Trendelenburg.  +---------+---------------+---------+-----------+----------+--------------+  RIGHT     Compressibility Phasicity Spontaneity Properties Thrombus Aging  +---------+---------------+---------+-----------+----------+--------------+  CFV       Full            Yes       Yes                                    +---------+---------------+---------+-----------+----------+--------------+  SFJ       Full                                                             +---------+---------------+---------+-----------+----------+--------------+  FV Prox   Full                                                             +---------+---------------+---------+-----------+----------+--------------+  FV Mid                    Yes       Yes                                    +---------+---------------+---------+-----------+----------+--------------+  FV Distal  Yes       Yes                                    +---------+---------------+---------+-----------+----------+--------------+  PFV       Full                                                             +---------+---------------+---------+-----------+----------+--------------+  POP       Full            Yes       Yes                                     +---------+---------------+---------+-----------+----------+--------------+  PTV       Full                                                             +---------+---------------+---------+-----------+----------+--------------+  PERO      Full                                                             +---------+---------------+---------+-----------+----------+--------------+   +---------+---------------+---------+-----------+----------+--------------+  LEFT      Compressibility Phasicity Spontaneity Properties Thrombus Aging  +---------+---------------+---------+-----------+----------+--------------+  CFV       Full            Yes       Yes                                    +---------+---------------+---------+-----------+----------+--------------+  SFJ       Full                                                             +---------+---------------+---------+-----------+----------+--------------+  FV Prox   Full                                                             +---------+---------------+---------+-----------+----------+--------------+  FV Mid                    Yes       Yes                                    +---------+---------------+---------+-----------+----------+--------------+  FV Distal                 Yes       Yes                                    +---------+---------------+---------+-----------+----------+--------------+  PFV       Full                                                             +---------+---------------+---------+-----------+----------+--------------+  POP       Full            Yes       Yes                                    +---------+---------------+---------+-----------+----------+--------------+  PTV       Full                                                             +---------+---------------+---------+-----------+----------+--------------+  PERO      Full                                                              +---------+---------------+---------+-----------+----------+--------------+     Summary: RIGHT: - There is no evidence of deep vein thrombosis in the lower extremity. However, portions of this examination were limited- see technologist comments above.  - No cystic structure found in the popliteal fossa.  LEFT: - There is no evidence of deep vein thrombosis in the lower extremity. However, portions of this examination were limited- see technologist comments above.  - No cystic structure found in the popliteal fossa.  *See table(s) above for measurements and observations. Electronically signed by Harold Barban MD on 03/18/2020 at 10:02:38 PM.    Final    ECHOCARDIOGRAM LIMITED  Result Date: 03/18/2020    ECHOCARDIOGRAM LIMITED REPORT   Patient Name:   WAKEELAH SOLAN Date of Exam: 03/18/2020 Medical Rec #:  [id number]       Height:       59.0 in Accession #:    [id number]      Weight:       145.1 lb Date of Birth:  [birth year]       BSA:          1.609 m Patient Age:    59 years        BP:           122/86 mmHg Patient Gender: F               HR:           121 bpm. Exam Location:  Inpatient Procedure: Limited Echo Indications:    CVA  History:  Patient has prior history of Echocardiogram examinations, most                 recent 09/27/2019. CHF, Previous Myocardial Infarction, Stroke,                 Signs/Symptoms:Shortness of Breath and Chest Pain; Risk                 Factors:Diabetes, Hypertension and Dyslipidemia. Pleural                 effusions.  Sonographer:    Dustin Flock Referring Phys: [id number] [place]  Sonographer Comments: Limited echo-HR elevated. IMPRESSIONS  1. Left ventricular ejection fraction, by estimation, is 20 to 25%. The left ventricle has severely decreased function. The left ventricle demonstrates regional wall motion abnormalities (see scoring diagram/findings for description). The left ventricular internal cavity size was mildly dilated. There is akinesis of the left  ventricular, entire anterior wall, anterolateral wall and apical segment. There is akinesis of the left ventricular, mid-apical lateral wall, inferolateral wall and inferior wall. There is akinesis of the left ventricular, basal-mid anteroseptal wall and inferoseptal wall.  2. Mild mitral valve regurgitation.  3. Large pleural effusion in the left lateral region.  4. The inferior vena cava is normal in size with <50% respiratory variability, suggesting right atrial pressure of 8 mmHg.  5. Tricuspid regurgitation signal is inadequate for assessing PA pressure. FINDINGS  Left Ventricle: Left ventricular ejection fraction, by estimation, is 20 to 25%. The left ventricle has severely decreased function. The left ventricle demonstrates regional wall motion abnormalities. The left ventricular internal cavity size was mildly  dilated. Right Ventricle: Tricuspid regurgitation signal is inadequate for assessing PA pressure. Mitral Valve: Mild mitral valve regurgitation. Tricuspid Valve: Tricuspid valve regurgitation is mild. Venous: The inferior vena cava is normal in size with less than 50% respiratory variability, suggesting right atrial pressure of 8 mmHg. Additional Comments: There is a large pleural effusion in the left lateral region. LEFT VENTRICLE PLAX 2D LVIDd:         5.40 cm LVIDs:         4.90 cm LV PW:         1.20 cm LV IVS:        0.90 cm  LV Volumes (MOD) LV vol d, MOD A4C: 147.0 ml LV vol s, MOD A4C: 117.0 ml LV SV MOD A4C:     147.0 ml LEFT ATRIUM         Index LA diam:    3.70 cm 2.30 cm/m Fransico Him MD Electronically signed by Fransico Him MD Signature Date/Time: 03/18/2020/3:57:57 PM    Final     Microbiology Recent Results (from the past 240 hour(s))  Culture, blood (routine x 2) Call MD if unable to obtain prior to antibiotics being given     Status: None   Collection Time: 03/21/20  8:45 AM   Specimen: BLOOD  Result Value Ref Range Status   Specimen Description BLOOD LEFT ANTECUBITAL   Final   Special Requests   Final    BOTTLES DRAWN AEROBIC ONLY Blood Culture adequate volume   Culture   Final    NO GROWTH 5 DAYS Performed at [facility], [address] [place], [place] [address]    Report Status 03/26/2020 FINAL  Final  Culture, blood (Routine X 2) w Reflex to ID Panel     Status: None   Collection Time: 03/21/20  8:55 AM  Specimen: BLOOD  Result Value Ref Range Status   Specimen Description BLOOD RIGHT ANTECUBITAL  Final   Special Requests   Final    BOTTLES DRAWN AEROBIC ONLY Blood Culture adequate volume   Culture   Final    NO GROWTH 5 DAYS Performed at [facility], [address] [place], [place] [address]    Report Status 03/26/2020 FINAL  Final    Lab Basic Metabolic Panel: Recent Labs  Lab 03/22/20 0942 03/23/20 1944  NA 134* 136  K 5.4* 5.2*  CL 100 99  CO2 19* 25  GLUCOSE 132* 153*  BUN 46* 47*  CREATININE 3.63* 3.59*  CALCIUM 8.9 8.8*   Liver Function Tests: No results for input(s): AST, ALT, ALKPHOS, BILITOT, PROT, ALBUMIN in the last 168 hours. No results for input(s): LIPASE, AMYLASE in the last 168 hours. No results for input(s): AMMONIA in the last 168 hours. CBC: No results for input(s): WBC, NEUTROABS, HGB, HCT, MCV, PLT in the last 168 hours. Cardiac Enzymes: No results for input(s): CKTOTAL, CKMB, CKMBINDEX, TROPONINI in the last 168 hours. Sepsis Labs: Recent Labs  Lab 03/22/20 0244  PROCALCITON 0.13     Dequandre Cordova 2020/04/27, 10:28 AM

## 2020-04-04 NOTE — Progress Notes (Signed)
Pronounced patient at 0845 this morning with Forrest Moron, RN. Dr. Wynelle Cleveland notified, daughter [name] notified, [employer] notified. Post mortem care completed by nurse techs and room cleaned/made amenable to visitors. Daughter stated she was on her way but has not yet arrived at time of this note. Was instructed by bed placement to call the medical examiner due to patient history of falls, which prompted her ED visit for this hospital stay. Medical examiner Lanny Hurst stated that because there was no brain bleed or traumatic head injury as the main cause of her hospitalization, it is not a medical examiner case. Will await daughter's arrival for funeral home information.

## 2020-04-04 DEATH — deceased

## 2020-04-16 ENCOUNTER — Ambulatory Visit: Payer: Medicare Other | Admitting: Neurology

## 2021-02-10 IMAGING — DX PORTABLE CHEST - 1 VIEW
1 series · 1 of 1 positions shown · non-contrast
Comparison: Chest x-rays dated 09/26/2018 and 09/24/2018

CLINICAL DATA: Acute CHF

EXAM:
PORTABLE CHEST 1 VIEW

[chest]
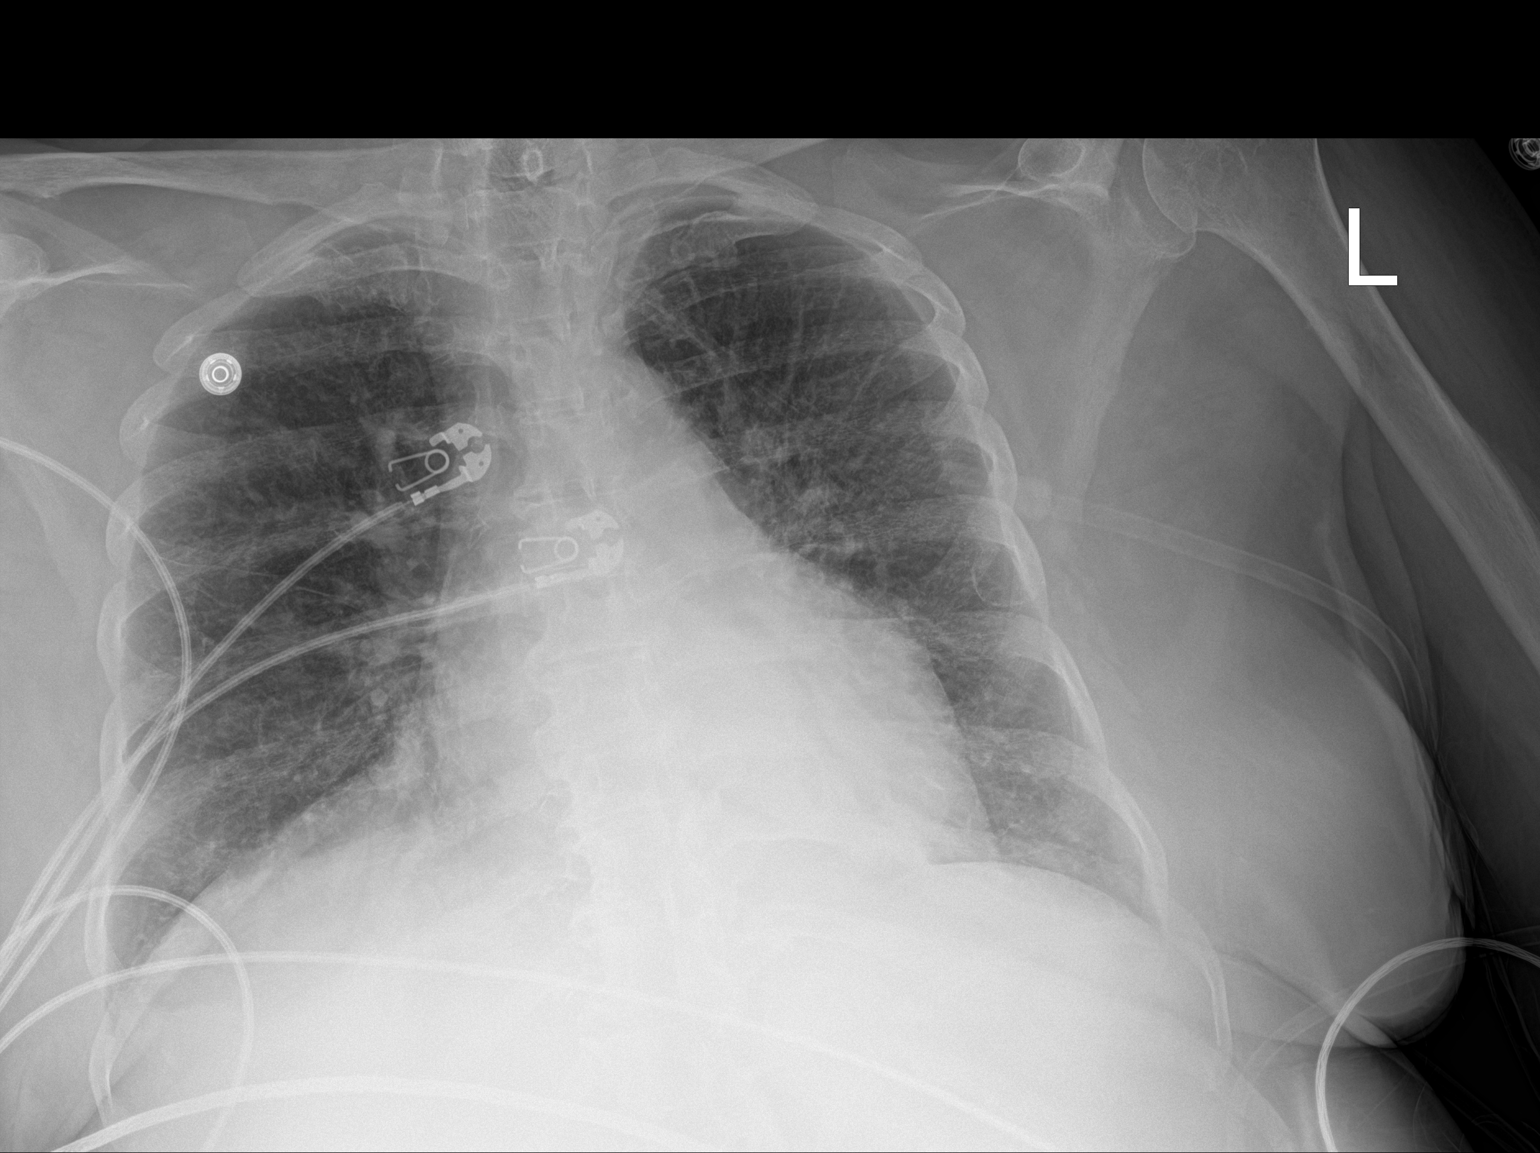

[1 of 1 positions shown; findings below may reference images not displayed]

FINDINGS: Stable cardiomegaly. Improved aeration at the LEFT lung base. No
evidence of pulmonary edema on today's exam. No pleural effusion or
pneumothorax seen.
IMPRESSION: Improved aeration at the LEFT lung base. No evidence of pneumonia or
pulmonary edema on today's exam. Stable cardiomegaly.

## 2021-06-27 IMAGING — DX DG CHEST 1V PORT
1 series · 1 of 1 positions shown · non-contrast
Comparison: 02/10/2019.

CLINICAL DATA: Dyspnea.  History of hypertension and CHF.

EXAM:
PORTABLE CHEST 1 VIEW

[chest ap]
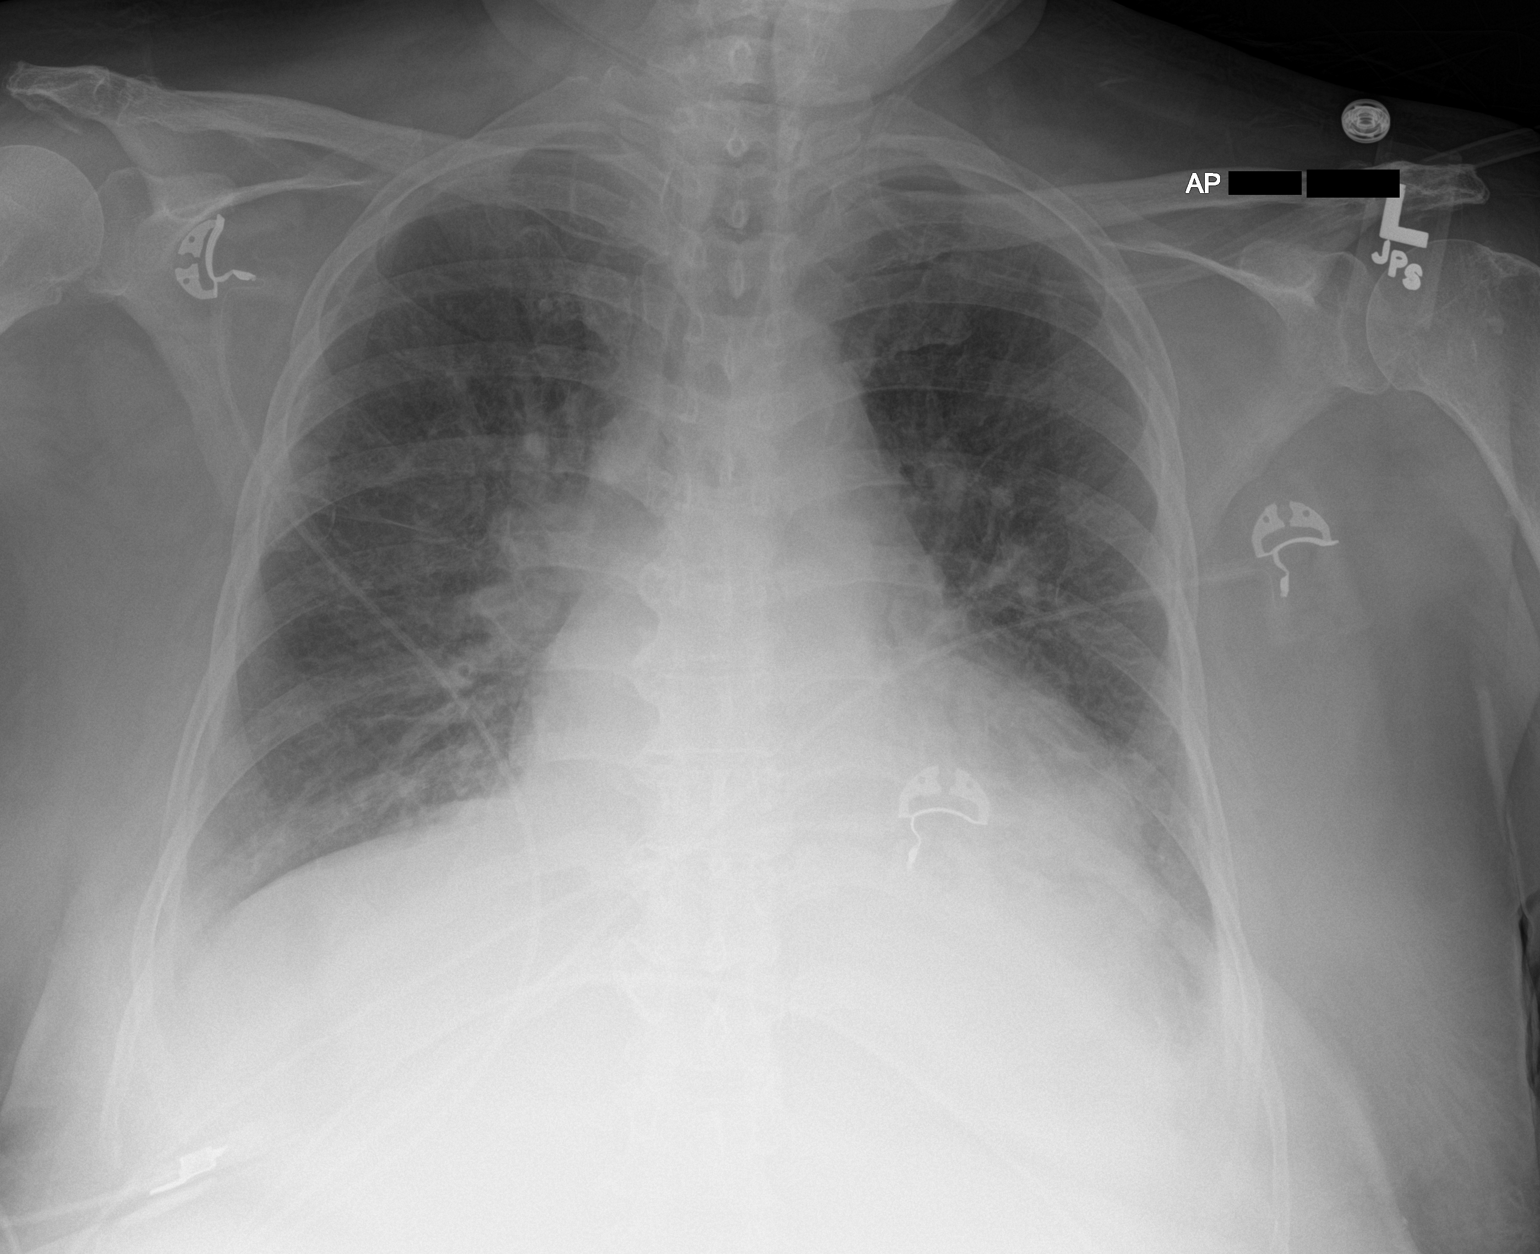

[1 of 1 positions shown; findings below may reference images not displayed]

FINDINGS: Cardiac silhouette is mildly enlarged. No mediastinal or hilar
masses.

There is vascular congestion with mild interstitial thickening
bilaterally, improved from the most recent prior study. Lung base
opacities are also improved, but not resolved, consistent with a
combination of atelectasis and small pleural effusions.

No pneumothorax.
IMPRESSION: Improved, but not resolved, congestive heart failure. No new
abnormalities.

## 2021-06-29 IMAGING — US US RENAL
1 series · 14 of 25 positions shown · non-contrast
Comparison: Prior ultrasound from 01/28/2019.

CLINICAL DATA: Initial evaluation for acute renal injury.

EXAM:
RENAL / URINARY TRACT ULTRASOUND COMPLETE

[Series 1: us renal · 14 of 39 slices shown]
[im 1/39]
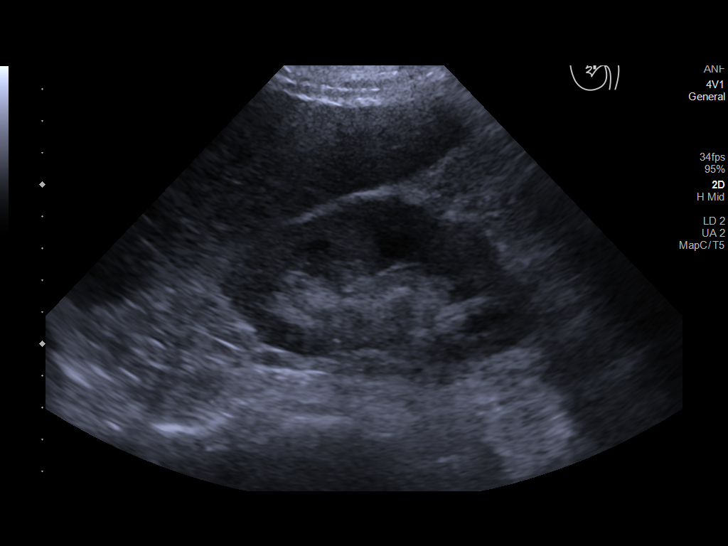
[im 4/39]
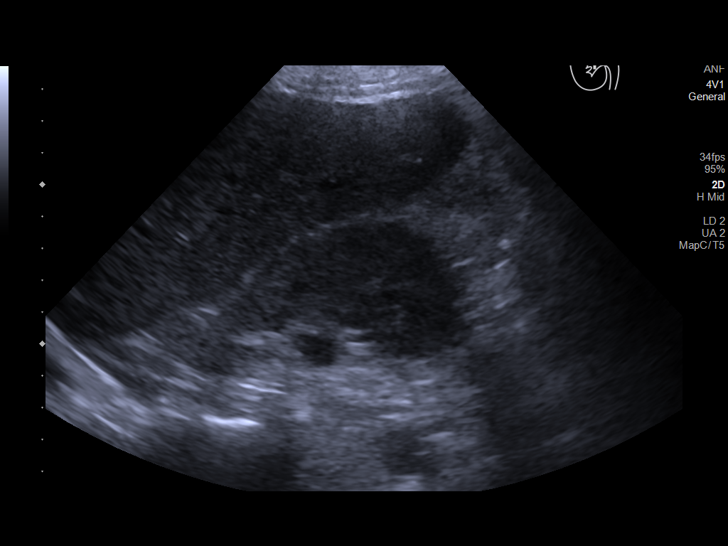
[im 7/39]
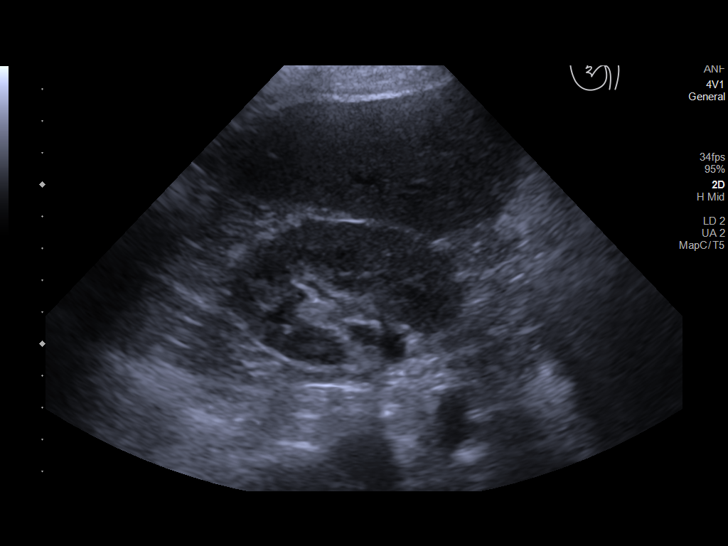
[im 10/39]
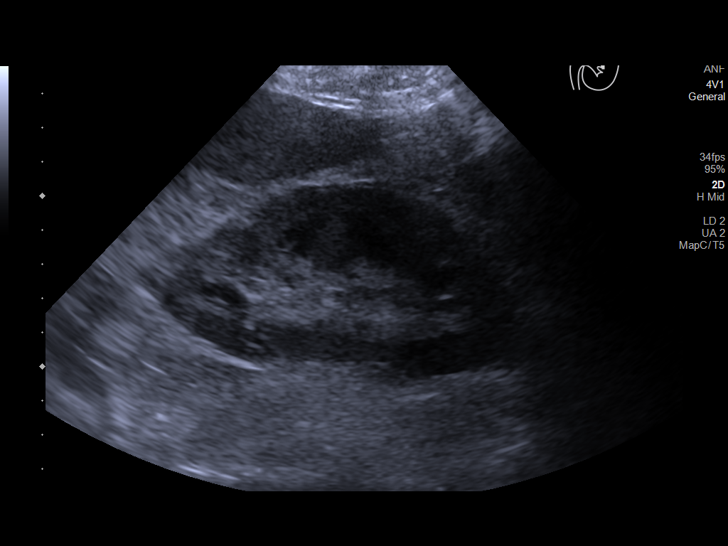
[im 13/39]
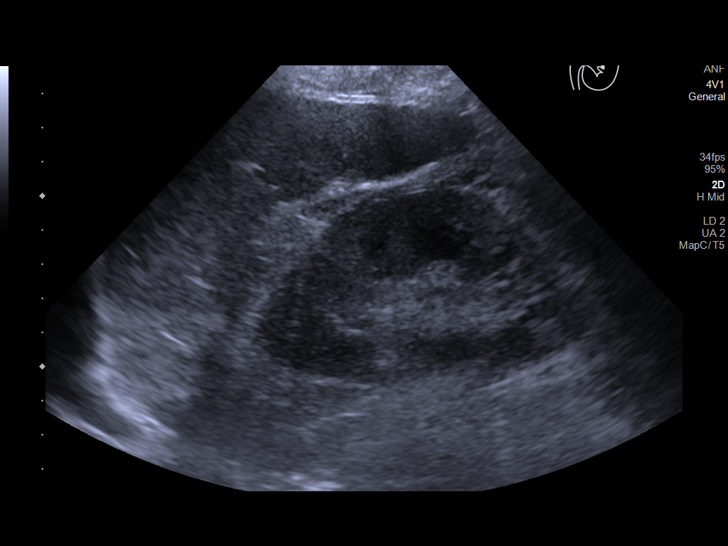
[im 15/39]
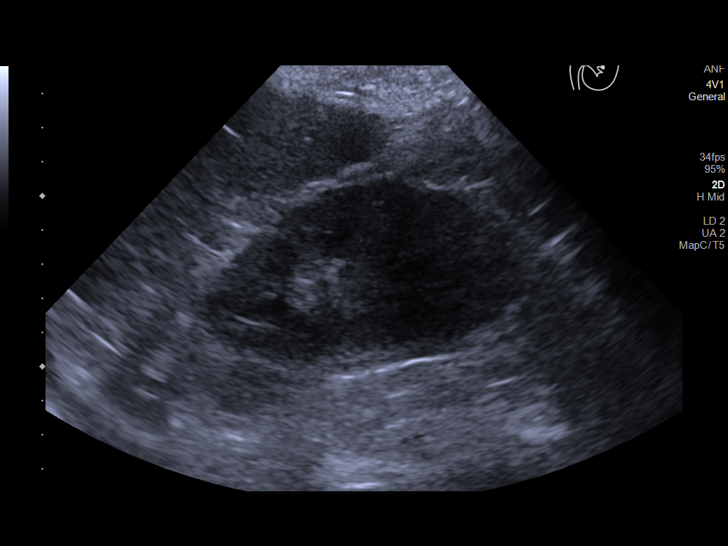
[im 18/39]
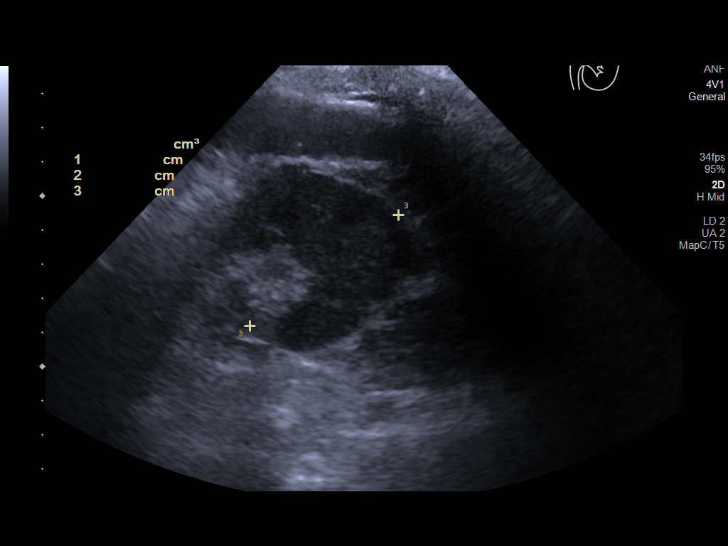
[im 21/39]
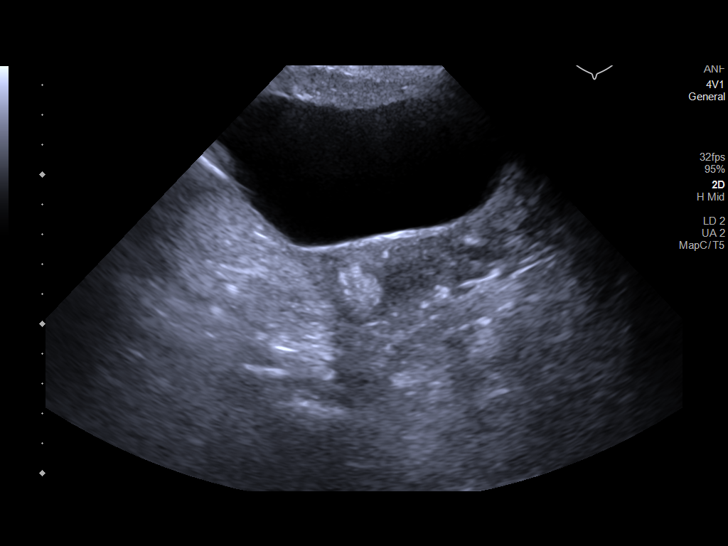
[im 24/39]
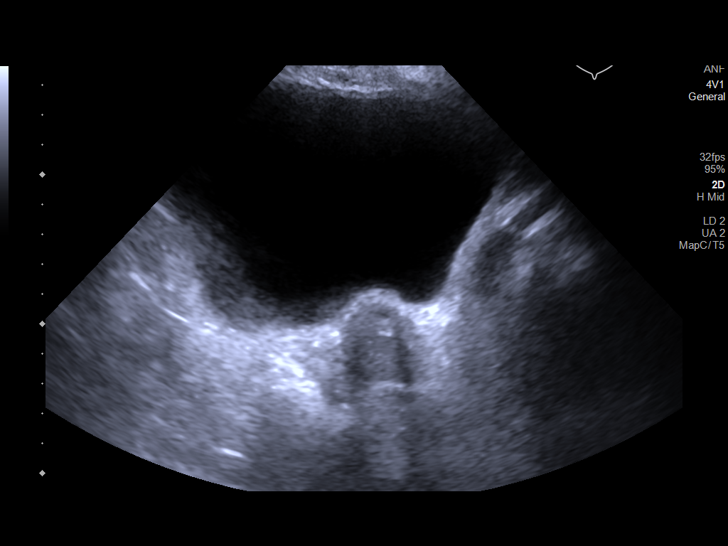
[im 26/39]
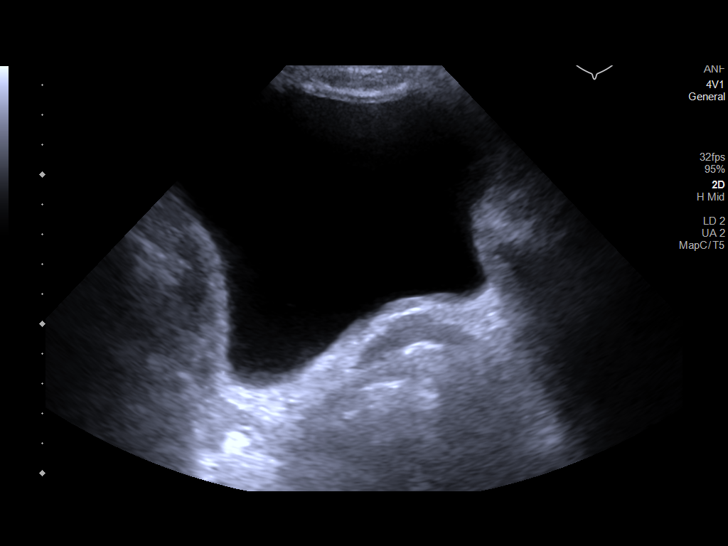
[im 29/39]
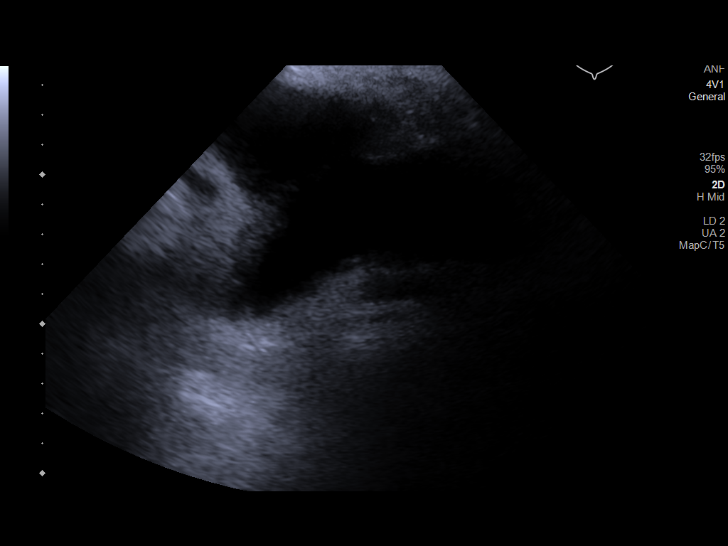
[im 32/39]
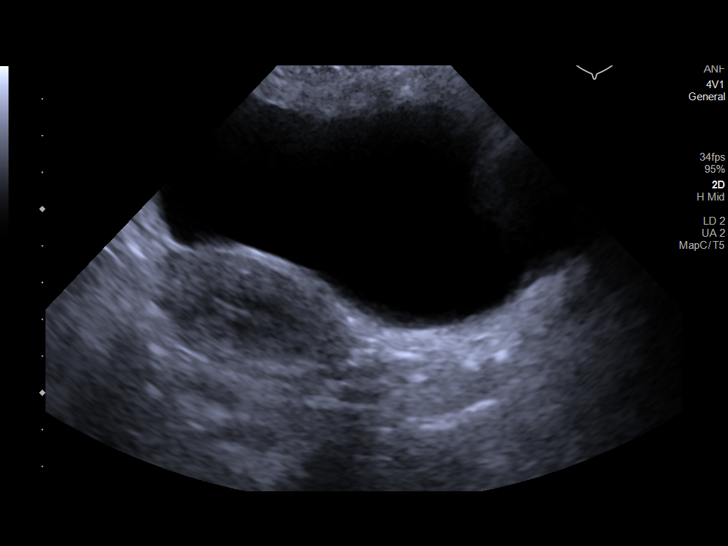
[im 35/39]
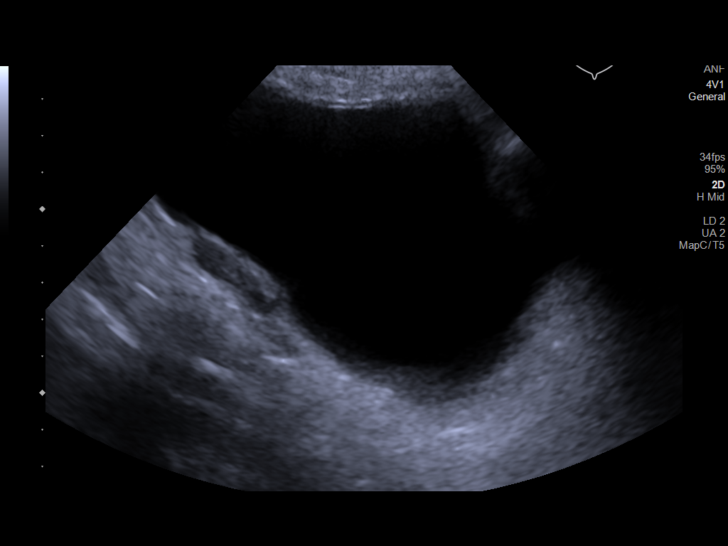
[im 39/39]
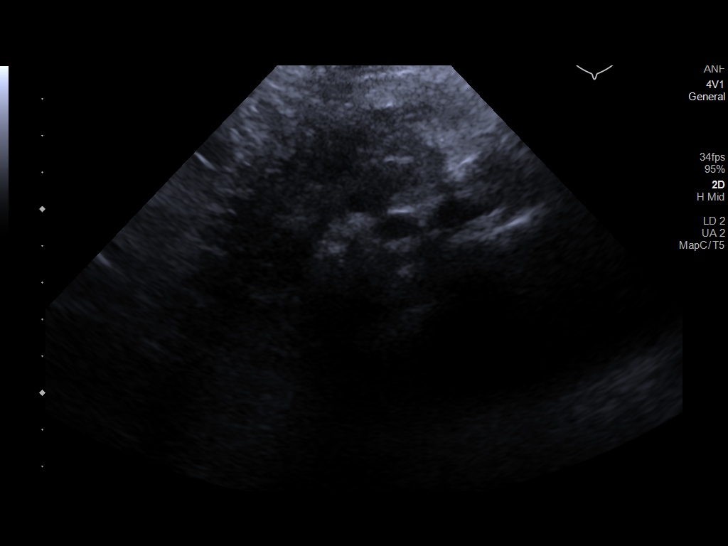

[14 of 25 positions shown; findings below may reference images not displayed]

FINDINGS: Right Kidney:

Renal measurements: 10.3 x 5.0 x 5.6 cm = volume: 151.0 mL.
Echogenicity within normal limits. No mass or hydronephrosis
visualized.

Left Kidney:

Renal measurements: 10.7 x 5.2 x 5.4 cm = volume: 157.6 mL.
Echogenicity within normal limits. No mass or hydronephrosis
visualized.

Bladder:

Appears normal for degree of bladder distention.

Other:

None.
IMPRESSION: Normal renal ultrasound.  No hydronephrosis or other acute finding.
